# Patient Record
Sex: Female | Born: 1955
Health system: Southern US, Community
[De-identification: ages and names within clinical notes are randomized; demographics above are authoritative.]

## PROBLEM LIST (undated history)

## (undated) DIAGNOSIS — J189 Pneumonia, unspecified organism: Secondary | ICD-10-CM

## (undated) DIAGNOSIS — Z9289 Personal history of other medical treatment: Secondary | ICD-10-CM

## (undated) DIAGNOSIS — D649 Anemia, unspecified: Secondary | ICD-10-CM

## (undated) DIAGNOSIS — T8859XA Other complications of anesthesia, initial encounter: Secondary | ICD-10-CM

## (undated) DIAGNOSIS — G3184 Mild cognitive impairment, so stated: Secondary | ICD-10-CM

## (undated) DIAGNOSIS — F32A Depression, unspecified: Secondary | ICD-10-CM

## (undated) DIAGNOSIS — F419 Anxiety disorder, unspecified: Secondary | ICD-10-CM

## (undated) DIAGNOSIS — I4729 Other ventricular tachycardia: Secondary | ICD-10-CM

## (undated) DIAGNOSIS — M1990 Unspecified osteoarthritis, unspecified site: Secondary | ICD-10-CM

## (undated) DIAGNOSIS — N390 Urinary tract infection, site not specified: Secondary | ICD-10-CM

## (undated) DIAGNOSIS — R000 Tachycardia, unspecified: Secondary | ICD-10-CM

## (undated) DIAGNOSIS — Q210 Ventricular septal defect: Secondary | ICD-10-CM

## (undated) DIAGNOSIS — Z8719 Personal history of other diseases of the digestive system: Secondary | ICD-10-CM

## (undated) DIAGNOSIS — R011 Cardiac murmur, unspecified: Secondary | ICD-10-CM

## (undated) DIAGNOSIS — K219 Gastro-esophageal reflux disease without esophagitis: Secondary | ICD-10-CM

## (undated) DIAGNOSIS — E0590 Thyrotoxicosis, unspecified without thyrotoxic crisis or storm: Secondary | ICD-10-CM

## (undated) DIAGNOSIS — E119 Type 2 diabetes mellitus without complications: Secondary | ICD-10-CM

## (undated) DIAGNOSIS — I10 Essential (primary) hypertension: Secondary | ICD-10-CM

## (undated) DIAGNOSIS — E7800 Pure hypercholesterolemia, unspecified: Secondary | ICD-10-CM

## (undated) DIAGNOSIS — I472 Ventricular tachycardia, unspecified: Secondary | ICD-10-CM

## (undated) DIAGNOSIS — F319 Bipolar disorder, unspecified: Secondary | ICD-10-CM

## (undated) DIAGNOSIS — F329 Major depressive disorder, single episode, unspecified: Secondary | ICD-10-CM

## (undated) DIAGNOSIS — I509 Heart failure, unspecified: Secondary | ICD-10-CM

## (undated) DIAGNOSIS — I429 Cardiomyopathy, unspecified: Secondary | ICD-10-CM

## (undated) HISTORY — DX: Cardiomyopathy, unspecified: I42.9

## (undated) HISTORY — PX: BACK SURGERY: SHX140

## (undated) HISTORY — PX: SHOULDER ARTHROSCOPY: SHX128

## (undated) HISTORY — DX: Tachycardia, unspecified: R00.0

## (undated) HISTORY — DX: Other ventricular tachycardia: I47.29

## (undated) HISTORY — PX: HERNIA REPAIR: SHX51

## (undated) HISTORY — DX: Ventricular septal defect: Q21.0

## (undated) HISTORY — DX: Essential (primary) hypertension: I10

## (undated) HISTORY — PX: SHOULDER OPEN ROTATOR CUFF REPAIR: SHX2407

## (undated) HISTORY — DX: Mild cognitive impairment, so stated: G31.84

## (undated) HISTORY — DX: Ventricular tachycardia: I47.2

## (undated) HISTORY — DX: Heart failure, unspecified: I50.9

## (undated) HISTORY — PX: TOTAL ABDOMINAL HYSTERECTOMY: SHX209

## (undated) HISTORY — PX: SPINE HARDWARE REMOVAL: SUR1132

## (undated) HISTORY — PX: ABDOMINAL HYSTERECTOMY: SUR658

## (undated) HISTORY — PX: JOINT REPLACEMENT: SHX530

## (undated) HISTORY — PX: REFRACTIVE SURGERY: SHX103

## (undated) HISTORY — DX: Ventricular tachycardia, unspecified: I47.20

## (undated) HISTORY — PX: TOTAL KNEE ARTHROPLASTY: SHX125

## (undated) HISTORY — PX: FRACTURE SURGERY: SHX138

---
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
## 1994-07-19 HISTORY — PX: SPINAL FUSION: SHX223

## 1997-11-11 ENCOUNTER — Other Ambulatory Visit: Admission: RE | Admit: 1997-11-11 | Discharge: 1997-11-11 | Payer: Self-pay | Admitting: Orthopedic Surgery

## 1997-11-21 ENCOUNTER — Ambulatory Visit (HOSPITAL_BASED_OUTPATIENT_CLINIC_OR_DEPARTMENT_OTHER): Admission: RE | Admit: 1997-11-21 | Discharge: 1997-11-21 | Payer: Self-pay | Admitting: Orthopedic Surgery

## 1997-12-06 ENCOUNTER — Other Ambulatory Visit: Admission: RE | Admit: 1997-12-06 | Discharge: 1997-12-06 | Payer: Self-pay | Admitting: Orthopedic Surgery

## 1998-04-01 ENCOUNTER — Other Ambulatory Visit: Admission: RE | Admit: 1998-04-01 | Discharge: 1998-04-01 | Payer: Self-pay | Admitting: Obstetrics and Gynecology

## 1998-10-21 ENCOUNTER — Other Ambulatory Visit: Admission: RE | Admit: 1998-10-21 | Discharge: 1998-10-21 | Payer: Self-pay | Admitting: Obstetrics and Gynecology

## 1998-10-22 ENCOUNTER — Other Ambulatory Visit: Admission: RE | Admit: 1998-10-22 | Discharge: 1998-10-22 | Payer: Self-pay | Admitting: Obstetrics and Gynecology

## 1999-05-03 ENCOUNTER — Emergency Department (HOSPITAL_COMMUNITY): Admission: EM | Admit: 1999-05-03 | Discharge: 1999-05-03 | Payer: Self-pay | Admitting: *Deleted

## 1999-05-13 ENCOUNTER — Encounter: Admission: RE | Admit: 1999-05-13 | Discharge: 1999-05-13 | Payer: Self-pay | Admitting: Family Medicine

## 1999-05-15 ENCOUNTER — Other Ambulatory Visit: Admission: RE | Admit: 1999-05-15 | Discharge: 1999-05-15 | Payer: Self-pay | Admitting: Obstetrics and Gynecology

## 1999-06-13 ENCOUNTER — Inpatient Hospital Stay (HOSPITAL_COMMUNITY): Admission: EM | Admit: 1999-06-13 | Discharge: 1999-06-16 | Payer: Self-pay | Admitting: Emergency Medicine

## 1999-08-19 ENCOUNTER — Other Ambulatory Visit: Admission: RE | Admit: 1999-08-19 | Discharge: 1999-08-19 | Payer: Self-pay | Admitting: Obstetrics and Gynecology

## 1999-09-22 ENCOUNTER — Other Ambulatory Visit (HOSPITAL_COMMUNITY): Admission: RE | Admit: 1999-09-22 | Discharge: 1999-10-05 | Payer: Self-pay | Admitting: Psychiatry

## 1999-10-06 ENCOUNTER — Inpatient Hospital Stay (HOSPITAL_COMMUNITY): Admission: RE | Admit: 1999-10-06 | Discharge: 1999-10-08 | Payer: Self-pay | Admitting: Obstetrics and Gynecology

## 2000-10-01 ENCOUNTER — Emergency Department (HOSPITAL_COMMUNITY): Admission: EM | Admit: 2000-10-01 | Discharge: 2000-10-01 | Payer: Self-pay | Admitting: Emergency Medicine

## 2000-11-25 ENCOUNTER — Ambulatory Visit (HOSPITAL_COMMUNITY): Admission: RE | Admit: 2000-11-25 | Discharge: 2000-11-25 | Payer: Self-pay | Admitting: Gastroenterology

## 2001-01-13 ENCOUNTER — Ambulatory Visit (HOSPITAL_COMMUNITY): Admission: RE | Admit: 2001-01-13 | Discharge: 2001-01-13 | Payer: Self-pay | Admitting: Surgery

## 2001-01-30 ENCOUNTER — Ambulatory Visit (HOSPITAL_COMMUNITY): Admission: RE | Admit: 2001-01-30 | Discharge: 2001-01-30 | Payer: Self-pay | Admitting: Surgery

## 2001-02-09 ENCOUNTER — Emergency Department (HOSPITAL_COMMUNITY): Admission: EM | Admit: 2001-02-09 | Discharge: 2001-02-09 | Payer: Self-pay | Admitting: Emergency Medicine

## 2001-02-22 ENCOUNTER — Observation Stay (HOSPITAL_COMMUNITY): Admission: RE | Admit: 2001-02-22 | Discharge: 2001-02-24 | Payer: Self-pay | Admitting: Surgery

## 2001-03-15 ENCOUNTER — Inpatient Hospital Stay (HOSPITAL_COMMUNITY): Admission: EM | Admit: 2001-03-15 | Discharge: 2001-03-22 | Payer: Self-pay | Admitting: *Deleted

## 2001-04-19 ENCOUNTER — Emergency Department (HOSPITAL_COMMUNITY): Admission: EM | Admit: 2001-04-19 | Discharge: 2001-04-19 | Payer: Self-pay | Admitting: Emergency Medicine

## 2001-05-08 ENCOUNTER — Inpatient Hospital Stay (HOSPITAL_COMMUNITY): Admission: EM | Admit: 2001-05-08 | Discharge: 2001-05-10 | Payer: Self-pay | Admitting: Emergency Medicine

## 2001-12-11 ENCOUNTER — Ambulatory Visit (HOSPITAL_COMMUNITY): Admission: RE | Admit: 2001-12-11 | Discharge: 2001-12-12 | Payer: Self-pay | Admitting: Orthopedic Surgery

## 2001-12-12 ENCOUNTER — Inpatient Hospital Stay (HOSPITAL_COMMUNITY): Admission: EM | Admit: 2001-12-12 | Discharge: 2002-01-15 | Payer: Self-pay | Admitting: Emergency Medicine

## 2002-01-17 ENCOUNTER — Ambulatory Visit (HOSPITAL_COMMUNITY): Admission: RE | Admit: 2002-01-17 | Discharge: 2002-01-17 | Payer: Self-pay | Admitting: Orthopedic Surgery

## 2002-06-18 ENCOUNTER — Emergency Department (HOSPITAL_COMMUNITY): Admission: EM | Admit: 2002-06-18 | Discharge: 2002-06-18 | Payer: Self-pay | Admitting: *Deleted

## 2002-06-18 ENCOUNTER — Encounter: Payer: Self-pay | Admitting: *Deleted

## 2002-09-06 ENCOUNTER — Ambulatory Visit (HOSPITAL_COMMUNITY): Admission: RE | Admit: 2002-09-06 | Discharge: 2002-09-06 | Payer: Self-pay | Admitting: Cardiology

## 2002-09-06 ENCOUNTER — Encounter: Payer: Self-pay | Admitting: Cardiology

## 2002-09-07 ENCOUNTER — Ambulatory Visit (HOSPITAL_COMMUNITY): Admission: RE | Admit: 2002-09-07 | Discharge: 2002-09-07 | Payer: Self-pay | Admitting: Cardiology

## 2002-11-22 ENCOUNTER — Ambulatory Visit (HOSPITAL_COMMUNITY): Admission: RE | Admit: 2002-11-22 | Discharge: 2002-11-22 | Payer: Self-pay | Admitting: Cardiology

## 2003-02-06 ENCOUNTER — Encounter: Payer: Self-pay | Admitting: Family Medicine

## 2003-02-06 ENCOUNTER — Ambulatory Visit (HOSPITAL_COMMUNITY): Admission: RE | Admit: 2003-02-06 | Discharge: 2003-02-06 | Payer: Self-pay | Admitting: Family Medicine

## 2003-09-02 ENCOUNTER — Encounter: Admission: RE | Admit: 2003-09-02 | Discharge: 2003-09-02 | Payer: Self-pay | Admitting: Gastroenterology

## 2004-01-29 ENCOUNTER — Encounter: Admission: RE | Admit: 2004-01-29 | Discharge: 2004-01-29 | Payer: Self-pay | Admitting: Obstetrics and Gynecology

## 2004-02-25 ENCOUNTER — Encounter: Admission: RE | Admit: 2004-02-25 | Discharge: 2004-02-25 | Payer: Self-pay | Admitting: Obstetrics and Gynecology

## 2004-05-11 ENCOUNTER — Ambulatory Visit: Payer: Self-pay | Admitting: Pain Medicine

## 2005-03-08 ENCOUNTER — Encounter: Admission: RE | Admit: 2005-03-08 | Discharge: 2005-03-08 | Payer: Self-pay | Admitting: Obstetrics and Gynecology

## 2005-03-25 ENCOUNTER — Ambulatory Visit: Payer: Self-pay | Admitting: Cardiology

## 2005-03-29 ENCOUNTER — Ambulatory Visit: Payer: Self-pay | Admitting: Cardiology

## 2005-04-05 ENCOUNTER — Ambulatory Visit: Payer: Self-pay | Admitting: Cardiology

## 2005-04-15 ENCOUNTER — Encounter: Admission: RE | Admit: 2005-04-15 | Discharge: 2005-04-15 | Payer: Self-pay | Admitting: Obstetrics and Gynecology

## 2005-05-05 ENCOUNTER — Encounter: Admission: RE | Admit: 2005-05-05 | Discharge: 2005-05-05 | Payer: Self-pay | Admitting: Orthopedic Surgery

## 2005-11-17 ENCOUNTER — Encounter: Payer: Self-pay | Admitting: Orthopedic Surgery

## 2005-11-17 ENCOUNTER — Encounter: Payer: Self-pay | Admitting: Emergency Medicine

## 2005-11-17 ENCOUNTER — Encounter: Payer: Self-pay | Admitting: Surgery

## 2006-04-11 ENCOUNTER — Encounter: Admission: RE | Admit: 2006-04-11 | Discharge: 2006-04-11 | Payer: Self-pay | Admitting: Obstetrics and Gynecology

## 2007-04-07 ENCOUNTER — Ambulatory Visit: Payer: Self-pay | Admitting: Cardiology

## 2007-05-03 ENCOUNTER — Encounter: Admission: RE | Admit: 2007-05-03 | Discharge: 2007-05-03 | Payer: Self-pay | Admitting: Family Medicine

## 2007-07-27 ENCOUNTER — Ambulatory Visit (HOSPITAL_COMMUNITY): Admission: RE | Admit: 2007-07-27 | Discharge: 2007-07-27 | Payer: Self-pay | Admitting: Family Medicine

## 2007-09-22 IMAGING — CT CT L SPINE W/O CM
3 series · 16 of 33 positions shown, 19 images · IV contrast (agent unspecified)
Comparison: none

CLINICAL DATA: 49 year old with back pain, scoliosis, and lumbar fusion. 
CT LUMBAR SPINE W/O CONTRAST:
The patient has a severe scoliotic curvature of the thoracolumbar spine.  There is evidence of a previous lumbar fusion.  There is also evidence of remote hardware in the lower lumbar spine with a pedicle screw which has been removed.  There is extensive posterolateral bony fusion.  There is incomplete fusion at the L2-3 level with separation of the large mass of bone.  It appears there has also been interbody fusions although some of this could be autofusion.  There is a vacuum disc phenomenon noted at L2-3.  
L1-2:  No spinal or foraminal stenosis. 
L2-3:  No significant spinal lateral recess stenosis.  No definite foraminal stenosis. 
L3-4:  Interbody fusion along the posterior fusion.  No significant spinal or foraminal stenosis. Mild foraminal encroachment on the left due to a shallow disc protrusion.
L4-5:  No significant spinal or foraminal stenosis.  
L5-S1:  Interbody fusion.  There is mild anterolisthesis of L5 compared to S1.  The exiting L5 nerve roots appear normal.  No spinal stenosis involving the S1 nerve roots.

[Series 4: recon 3: l-spine helical · axial · 0.27mm/px · z∈[-68,+100]mm · 8 of 161 slices shown, 10 images]
[im 13/161  soft-tissue]
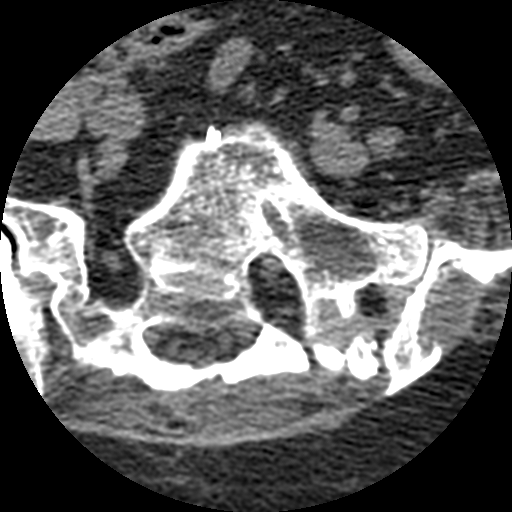
[im 13/161  bone]
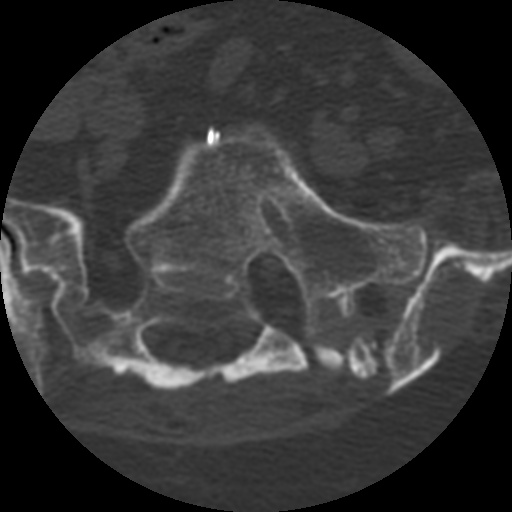
[im 37/161  bone]
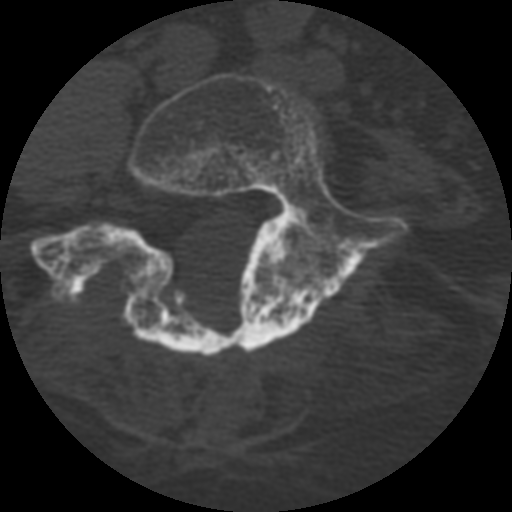
[im 50/161  bone]
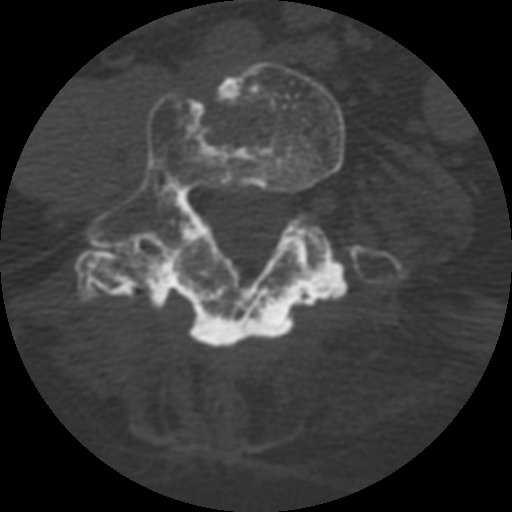
[im 74/161  bone]
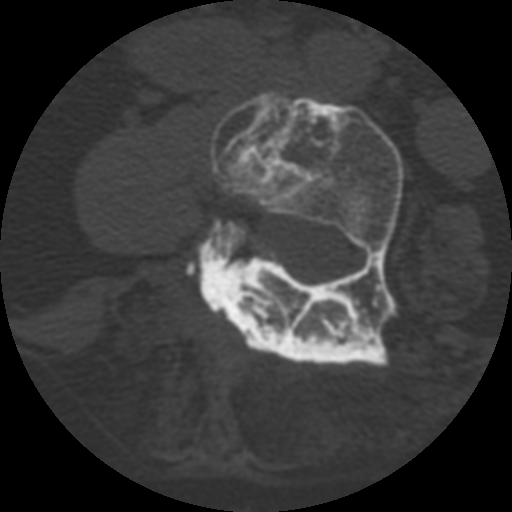
[im 87/161  soft-tissue]
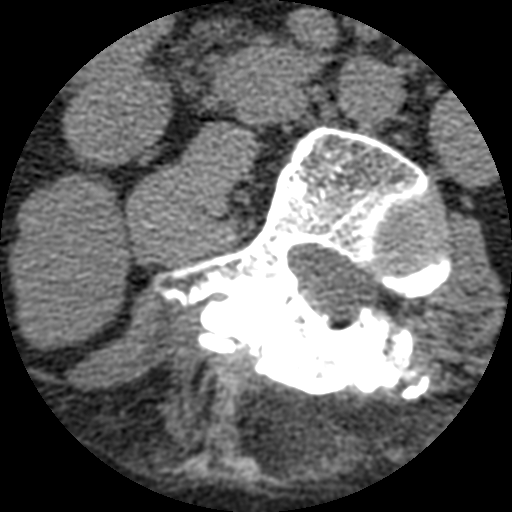
[im 87/161  bone]
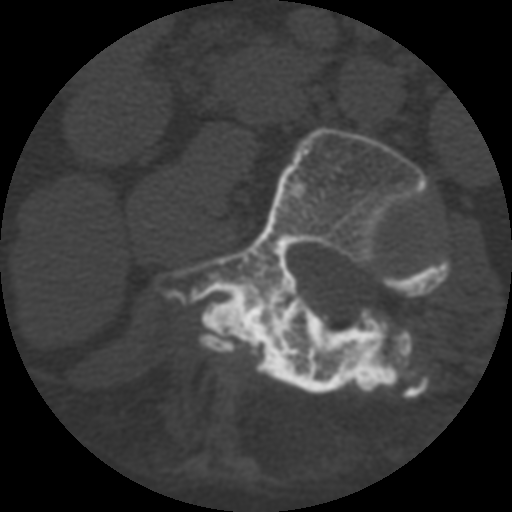
[im 111/161  bone]
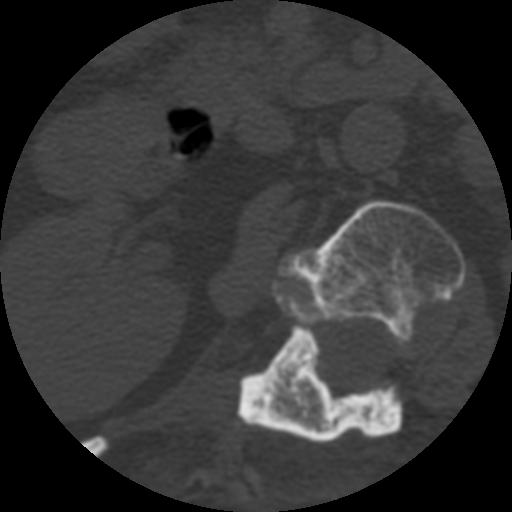
[im 124/161  bone]
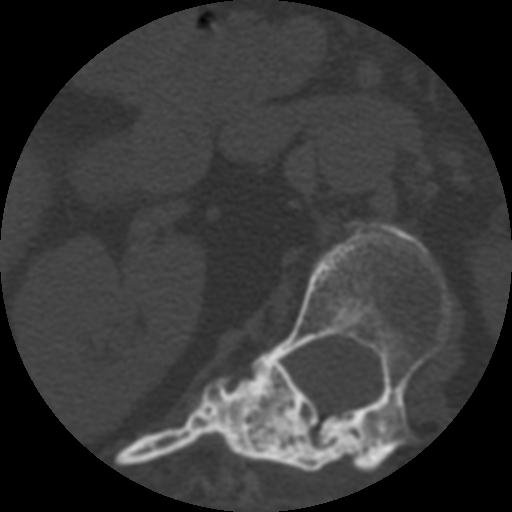
[im 148/161  bone]
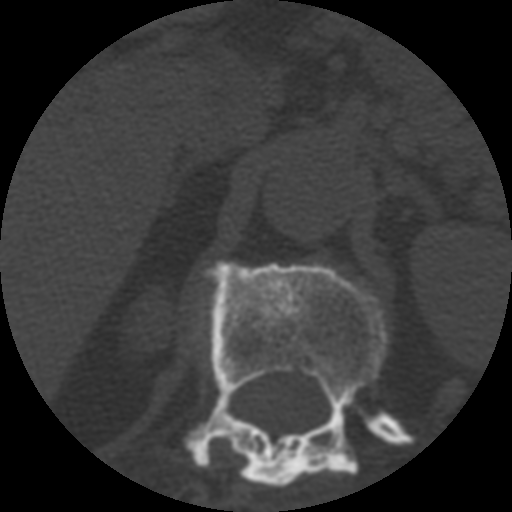

[Series 400: reformatted · sagittal · 0.39mm/px · 5 of 38 slices shown, 6 images (1 of 2)]
[im 13/38  bone]
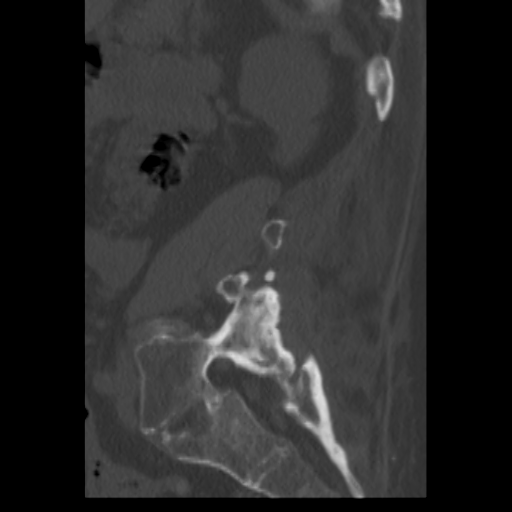
[im 16/38  bone]
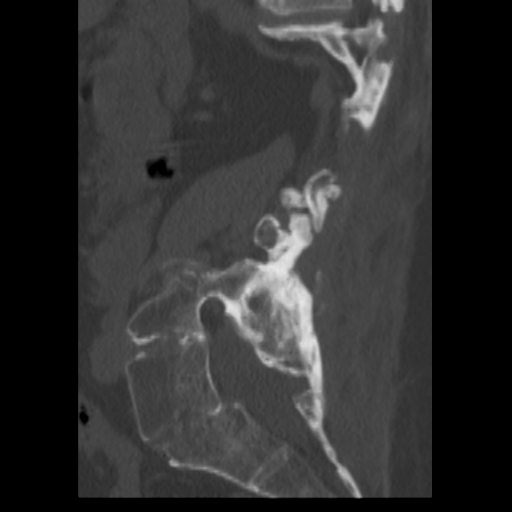
[im 19/38  soft-tissue]
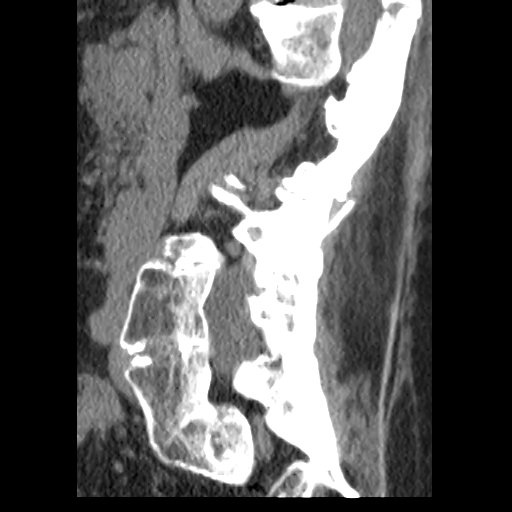
[im 19/38  bone]
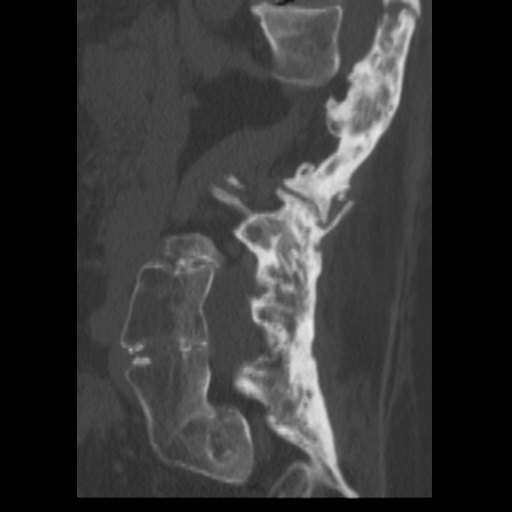
[im 22/38  bone]
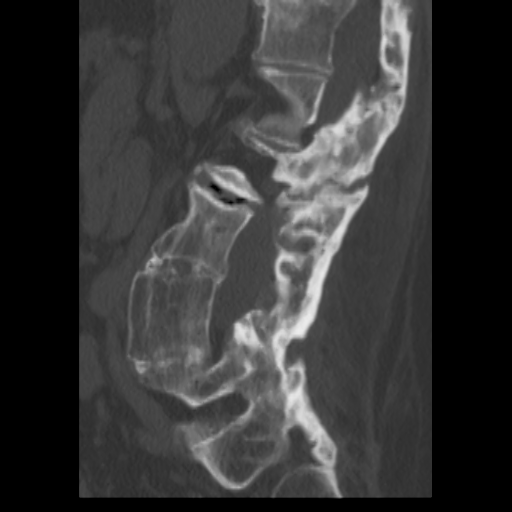
[im 25/38  bone]
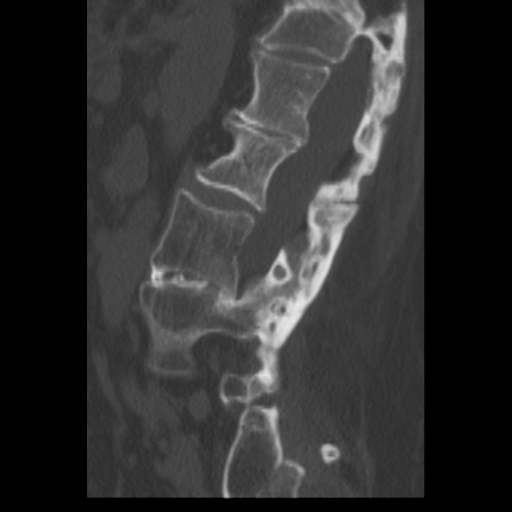

[Series 401: reformatted · coronal · 0.39mm/px · 3 of 40 slices shown (2 of 2)]
[im 8/40  bone]
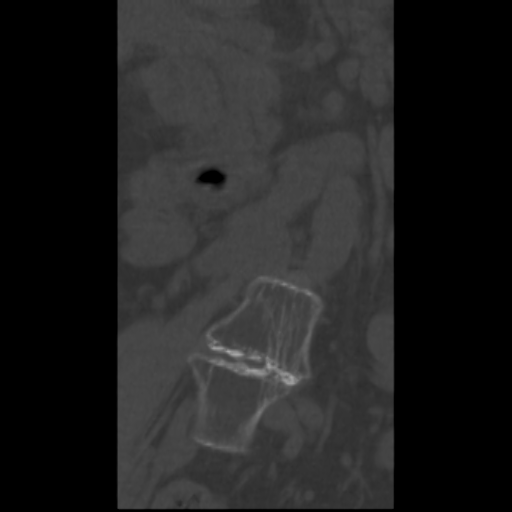
[im 16/40  bone]
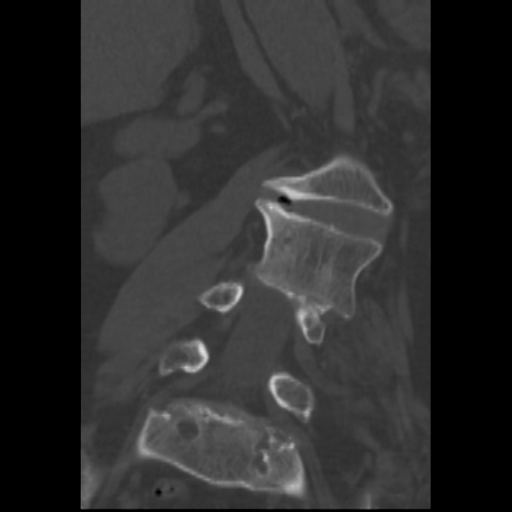
[im 24/40  bone]
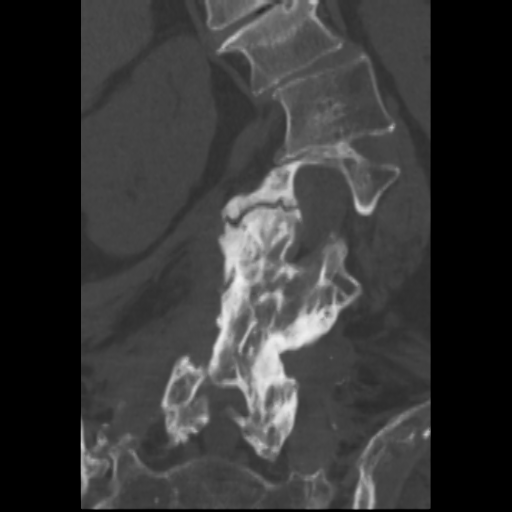

[16 of 33 positions shown; findings below may reference images not displayed]

IMPRESSION: 1.  Severe scoliotic curvature of the lumbar spine.  There is posterior and interbody fusion from T12 to S1.  There is incomplete fusion posteriorly at the L2 level.  
2.  The interbody fusions appear to be at L3-4, L4-5, and L5-S1.  No significant spinal, lateral recess, or foraminal stenosis.  There is mild left foraminal encroachment at L3-4 due to a shallow disc protrusion.  
3.  Mild anterolisthesis of L5 compared to S1.

## 2007-09-23 ENCOUNTER — Emergency Department (HOSPITAL_COMMUNITY): Admission: EM | Admit: 2007-09-23 | Discharge: 2007-09-24 | Payer: Self-pay | Admitting: Emergency Medicine

## 2008-02-19 ENCOUNTER — Ambulatory Visit (HOSPITAL_COMMUNITY): Admission: RE | Admit: 2008-02-19 | Discharge: 2008-02-19 | Payer: Self-pay | Admitting: Orthopedic Surgery

## 2008-02-21 ENCOUNTER — Ambulatory Visit: Payer: Self-pay

## 2008-02-21 ENCOUNTER — Ambulatory Visit: Payer: Self-pay | Admitting: Cardiology

## 2008-02-21 ENCOUNTER — Encounter (INDEPENDENT_AMBULATORY_CARE_PROVIDER_SITE_OTHER): Payer: Self-pay | Admitting: Anesthesiology

## 2008-04-02 ENCOUNTER — Ambulatory Visit (HOSPITAL_BASED_OUTPATIENT_CLINIC_OR_DEPARTMENT_OTHER): Admission: RE | Admit: 2008-04-02 | Discharge: 2008-04-03 | Payer: Self-pay | Admitting: Orthopedic Surgery

## 2008-06-19 ENCOUNTER — Encounter: Admission: RE | Admit: 2008-06-19 | Discharge: 2008-06-19 | Payer: Self-pay | Admitting: Family Medicine

## 2009-06-13 DIAGNOSIS — I472 Ventricular tachycardia, unspecified: Secondary | ICD-10-CM | POA: Insufficient documentation

## 2009-06-13 DIAGNOSIS — I10 Essential (primary) hypertension: Secondary | ICD-10-CM | POA: Insufficient documentation

## 2009-06-13 DIAGNOSIS — E119 Type 2 diabetes mellitus without complications: Secondary | ICD-10-CM | POA: Insufficient documentation

## 2009-06-13 DIAGNOSIS — I4729 Other ventricular tachycardia: Secondary | ICD-10-CM | POA: Insufficient documentation

## 2009-06-13 DIAGNOSIS — Q210 Ventricular septal defect: Secondary | ICD-10-CM | POA: Insufficient documentation

## 2009-06-17 ENCOUNTER — Ambulatory Visit: Payer: Self-pay | Admitting: Cardiology

## 2009-06-26 ENCOUNTER — Encounter (INDEPENDENT_AMBULATORY_CARE_PROVIDER_SITE_OTHER): Payer: Self-pay

## 2009-07-30 ENCOUNTER — Encounter: Admission: RE | Admit: 2009-07-30 | Discharge: 2009-07-30 | Payer: Self-pay | Admitting: Obstetrics and Gynecology

## 2009-08-04 ENCOUNTER — Ambulatory Visit: Payer: Self-pay

## 2009-08-04 ENCOUNTER — Encounter: Payer: Self-pay | Admitting: Cardiology

## 2009-08-04 ENCOUNTER — Ambulatory Visit: Payer: Self-pay | Admitting: Cardiology

## 2009-08-04 ENCOUNTER — Ambulatory Visit (HOSPITAL_COMMUNITY): Admission: RE | Admit: 2009-08-04 | Discharge: 2009-08-04 | Payer: Self-pay | Admitting: Cardiology

## 2010-02-13 ENCOUNTER — Encounter: Payer: Self-pay | Admitting: Cardiology

## 2010-02-13 ENCOUNTER — Ambulatory Visit: Payer: Self-pay

## 2010-02-13 ENCOUNTER — Ambulatory Visit: Payer: Self-pay | Admitting: Cardiovascular Disease

## 2010-02-13 ENCOUNTER — Ambulatory Visit (HOSPITAL_COMMUNITY): Admission: RE | Admit: 2010-02-13 | Discharge: 2010-02-13 | Payer: Self-pay | Admitting: Cardiology

## 2010-02-16 ENCOUNTER — Telehealth: Payer: Self-pay | Admitting: Cardiology

## 2010-03-10 ENCOUNTER — Telehealth: Payer: Self-pay | Admitting: Cardiology

## 2010-03-18 ENCOUNTER — Ambulatory Visit: Payer: Self-pay | Admitting: Cardiology

## 2010-03-18 DIAGNOSIS — I429 Cardiomyopathy, unspecified: Secondary | ICD-10-CM | POA: Insufficient documentation

## 2010-03-18 DIAGNOSIS — Z720 Tobacco use: Secondary | ICD-10-CM | POA: Insufficient documentation

## 2010-03-18 DIAGNOSIS — F17200 Nicotine dependence, unspecified, uncomplicated: Secondary | ICD-10-CM | POA: Insufficient documentation

## 2010-03-24 ENCOUNTER — Encounter (INDEPENDENT_AMBULATORY_CARE_PROVIDER_SITE_OTHER): Payer: Self-pay | Admitting: *Deleted

## 2010-03-27 ENCOUNTER — Ambulatory Visit (HOSPITAL_COMMUNITY): Admission: RE | Admit: 2010-03-27 | Discharge: 2010-03-27 | Payer: Self-pay | Admitting: Cardiology

## 2010-03-27 ENCOUNTER — Ambulatory Visit: Payer: Self-pay | Admitting: Cardiology

## 2010-03-30 ENCOUNTER — Ambulatory Visit: Payer: Self-pay | Admitting: Cardiology

## 2010-04-02 LAB — CONVERTED CEMR LAB
CO2: 29 meq/L (ref 19–32)
Chloride: 105 meq/L (ref 96–112)
Potassium: 5 meq/L (ref 3.5–5.1)
Sodium: 141 meq/L (ref 135–145)

## 2010-04-15 ENCOUNTER — Ambulatory Visit: Payer: Self-pay | Admitting: Cardiology

## 2010-04-20 LAB — CONVERTED CEMR LAB
BUN: 23 mg/dL (ref 6–23)
CO2: 22 meq/L (ref 19–32)
Chloride: 106 meq/L (ref 96–112)
Glucose, Bld: 101 mg/dL — ABNORMAL HIGH (ref 70–99)
Potassium: 4.3 meq/L (ref 3.5–5.1)
Sodium: 138 meq/L (ref 135–145)

## 2010-05-20 ENCOUNTER — Encounter: Payer: Self-pay | Admitting: Cardiology

## 2010-05-20 ENCOUNTER — Ambulatory Visit: Payer: Self-pay | Admitting: Cardiology

## 2010-06-01 ENCOUNTER — Telehealth: Payer: Self-pay | Admitting: Cardiology

## 2010-06-04 ENCOUNTER — Ambulatory Visit: Payer: Self-pay | Admitting: Cardiology

## 2010-06-24 DIAGNOSIS — Q219 Congenital malformation of cardiac septum, unspecified: Secondary | ICD-10-CM | POA: Insufficient documentation

## 2010-06-24 DIAGNOSIS — I509 Heart failure, unspecified: Secondary | ICD-10-CM | POA: Insufficient documentation

## 2010-06-25 LAB — CONVERTED CEMR LAB
CO2: 25 meq/L (ref 19–32)
Calcium: 9 mg/dL (ref 8.4–10.5)
Chloride: 100 meq/L (ref 96–112)
Creatinine, Ser: 0.9 mg/dL (ref 0.4–1.2)
Glucose, Bld: 87 mg/dL (ref 70–99)

## 2010-07-03 ENCOUNTER — Ambulatory Visit: Payer: Self-pay

## 2010-07-03 ENCOUNTER — Encounter: Payer: Self-pay | Admitting: Cardiology

## 2010-07-03 ENCOUNTER — Ambulatory Visit: Payer: Self-pay | Admitting: Cardiology

## 2010-07-03 ENCOUNTER — Ambulatory Visit (HOSPITAL_COMMUNITY)
Admission: RE | Admit: 2010-07-03 | Discharge: 2010-07-03 | Payer: Self-pay | Source: Home / Self Care | Attending: Cardiology | Admitting: Cardiology

## 2010-07-06 LAB — CONVERTED CEMR LAB
BUN: 16 mg/dL (ref 6–23)
Creatinine, Ser: 0.79 mg/dL (ref 0.40–1.20)
Glucose, Bld: 85 mg/dL (ref 70–99)
Potassium: 4.1 meq/L (ref 3.5–5.3)

## 2010-07-17 ENCOUNTER — Ambulatory Visit: Payer: Self-pay | Admitting: Cardiology

## 2010-07-22 ENCOUNTER — Telehealth: Payer: Self-pay | Admitting: Cardiology

## 2010-07-31 ENCOUNTER — Encounter: Payer: Self-pay | Admitting: Cardiology

## 2010-08-05 ENCOUNTER — Encounter: Payer: Self-pay | Admitting: Cardiology

## 2010-08-05 ENCOUNTER — Ambulatory Visit
Admission: RE | Admit: 2010-08-05 | Discharge: 2010-08-05 | Payer: Self-pay | Source: Home / Self Care | Attending: Cardiology | Admitting: Cardiology

## 2010-08-09 ENCOUNTER — Encounter: Payer: Self-pay | Admitting: Obstetrics and Gynecology

## 2010-08-10 ENCOUNTER — Ambulatory Visit
Admission: RE | Admit: 2010-08-10 | Discharge: 2010-08-10 | Payer: Self-pay | Source: Home / Self Care | Attending: Cardiology | Admitting: Cardiology

## 2010-08-10 ENCOUNTER — Encounter: Payer: Self-pay | Admitting: Cardiology

## 2010-08-12 ENCOUNTER — Other Ambulatory Visit: Payer: Self-pay | Admitting: Obstetrics and Gynecology

## 2010-08-12 ENCOUNTER — Telehealth: Payer: Self-pay | Admitting: Cardiology

## 2010-08-12 DIAGNOSIS — Z1239 Encounter for other screening for malignant neoplasm of breast: Secondary | ICD-10-CM

## 2010-08-12 LAB — CONVERTED CEMR LAB
BUN: 24 mg/dL — ABNORMAL HIGH (ref 6–23)
Calcium: 9.4 mg/dL (ref 8.4–10.5)
Glucose, Bld: 77 mg/dL (ref 70–99)
Potassium: 4.5 meq/L (ref 3.5–5.3)
Sodium: 137 meq/L (ref 135–145)

## 2010-08-16 LAB — CONVERTED CEMR LAB
BUN: 19 mg/dL (ref 6–23)
Chloride: 104 meq/L (ref 96–112)
GFR calc non Af Amer: 80.23 mL/min (ref >60)
Magnesium: 2 mg/dL (ref 1.5–2.5)
Potassium: 4.2 meq/L (ref 3.5–5.1)
Sodium: 138 meq/L (ref 135–145)

## 2010-08-18 NOTE — Assessment & Plan Note (Signed)
Summary: f/u echo   Visit Type:  4m follow up Primary Provider:  Dr.Robert Nicholos Johns  CC:  dizziness and tired.  History of Present Illness: [name] is in for follow up to review our echo findings, and to discuss possible etiologies and treatment plans.  She acknowledges seeing the letter we sent her and also says she is not taking amlodiipine either.  She continues to smoke, but fortunately has not been drinking in quite a few years now.  The patient has history of congenital VSD, and cryoablation for VT.  Prior coronary angio was normal   Current Medications (verified): 1)  Luvox Cr 100 Mg Xr24h-Cap (Fluvoxamine Maleate) .... Take 1 Capsule By Mouth Three Times A Day 2)  Seroquel 300 Mg Tabs (Quetiapine Fumarate) .... Take 2 Tablets Daily 3)  Trazodone Hcl 100 Mg Tabs (Trazodone Hcl) .... Take 1 Tab By Mouth At Bedtime 4)  Synthroid 25 Mcg Tabs (Levothyroxine Sodium) .... Once Daily 5)  Wellbutrin Xl 300 Mg Xr24h-Tab (Bupropion Hcl) .... Take 1 Tablet By Mouth Once A Day 6)  Multivitamins  Tabs (Multiple Vitamin) 7)  Aspirin 81 Mg Tbec (Aspirin) .... Take One Tablet By Mouth Daily 8)  Omeprazole 20 Mg Tbec (Omeprazole) .... Take 1 Tablet By Mouth Once A Day 9)  Metformin Hcl 1000 Mg Tabs (Metformin Hcl) .... Take 1 Tablet By Mouth Two Times A Day 10)  Gabapentin 300 Mg Caps (Gabapentin) .... Take 2 Caps Two Times A Day  Allergies (verified): No Known Drug Allergies  Past History:  Past Medical History: Last updated: 2009-07-03 Problems:  HYPERTENSION, UNSPECIFIED (ICD-401.9) VENTRICULAR SEPTAL DEFECT, CONGENITAL (ICD-745.4) VENTRICULAR TACHYCARDIA (ICD-427.1) DM (ICD-250.00)  Past Surgical History: Last updated: July 03, 2009  Diagnostic arthroscopy of right shoulder with arthroscopic  debridement of labral tear Hernia repair  Family History: Last updated: 07/03/09  Her father died at age 14 of CVA, her mother is alive.   Multiple uncles have died with myocardial infarction.      Social History: Last updated: Jul 03, 2009  The patient is married.  She is a former Runner, broadcasting/film/video.  She   smokes.    Vital Signs:  Patient profile:   55 year old female Height:      64 inches Weight:      185.50 pounds BMI:     31.96 Pulse rate:   86 / minute BP sitting:   132 / 96  (left arm) Cuff size:   large  Vitals Entered By: Caralee Ates CMA (March 18, 2010 2:24 PM)  Physical Exam  General:  Well developed, well nourished, in no acute distress. Head:  normocephalic and atraumatic Eyes:  PERRLA/EOM intact; conjunctiva and lids normal. Lungs:  Clear bilaterally to auscultation and percussion. Heart:  Prominent VSD murmur, holosystolic at upper left sternal edge.   Extremities:  No clubbing or cyanosis. Neurologic:  Alert and oriented x 3.   EKG  Procedure date:  03/18/2010  Findings:      NSR.  LAE.  LAFB.  LVH.  No T wave inversion as seen previously.  Delay in R wave progression.  Echocardiogram  Procedure date:  02/13/2010  Findings:      Study Conclusions            - Left ventricle: The cavity size was moderately dilated. Wall       thickness was normal. Systolic function was moderately to severely       reduced. The estimated ejection fraction was in the range of 30%  to 35%. Diffuse hypokinesis.     - Aortic valve: Trivial regurgitation.     - Left atrium: The atrium was mildly dilated.     - Atrial septum: No defect or patent foramen ovale was identified.     - Impressions: High septal to perimembranous VSD     Impressions:            - High septal to perimembranous VSD  Impression & Recommendations:  Problem # 1:  HYPERTENSION, UNSPECIFIED (ICD-401.9) Has hypertension that is not well controlled at present.  With LV dysfunction, which may be due to poorly treated hypertension, will add ACE and check BMET before starting. Possible side effects reviewed in detail with patient.  The following medications were removed from the medication list:     Amlodipine Besylate 5 Mg Tabs (Amlodipine besylate) .[name]... Take one tablet by mouth daily Her updated medication list for this problem includes:    Aspirin 81 Mg Tbec (Aspirin) .[name]... Take one tablet by mouth daily    Lisinopril 10 Mg Tabs (Lisinopril) .[name]... Take one tablet by mouth daily  Problem # 2:  CARDIOMYOPATHY, SECONDARY (ICD-425.9) Likely due to hypertension, with global picture.  Other poss include less likely from cryoablation.  The following medications were removed from the medication list:    Amlodipine Besylate 5 Mg Tabs (Amlodipine besylate) .[name]... Take one tablet by mouth daily Her updated medication list for this problem includes:    Aspirin 81 Mg Tbec (Aspirin) .[name]... Take one tablet by mouth daily    Lisinopril 10 Mg Tabs (Lisinopril) .[name]... Take one tablet by mouth daily  Problem # 3:  VENTRICULAR SEPTAL DEFECT, CONGENITAL (ICD-745.4) Previously noted.  Exam consistent.  Orders: Cardiac MRI (Cardiac MRI) TLB-BMP (Basic Metabolic Panel-BMET) (80048-METABOL) TLB-Magnesium (Mg) (83735-MG)  Problem # 4:  DM (ICD-250.00) reviewed guidelines.  Symptoms could be from this.  Her updated medication list for this problem includes:    Aspirin 81 Mg Tbec (Aspirin) .[name]... Take one tablet by mouth daily    Metformin Hcl 1000 Mg Tabs (Metformin hcl) .[name]... Take 1 tablet by mouth two times a day    Lisinopril 10 Mg Tabs (Lisinopril) .[name]... Take one tablet by mouth daily  Problem # 5:  TOBACCO ABUSE (ICD-305.1) reviewed with patient.   Patient Instructions: 1)  Your physician recommends that you schedule a follow-up appointment in: 3 weeks 2)  Your physician recommends that you return for lab work today for a BMP & Magnesium level. Return in 1 week for a BMP/401.9 3)  Your physician has requested that you have a cardiac MRI.  Cardiac MRI uses a computer to create images of your heart as it's beating, producing both still and moving pictures of your heart and major blood vessels. For  further information please visit  [email or web address]  Please follow the instruction sheet given to you today for more information. 4)  Your physician has recommended you make the following change in your medication: ADD LISINOPRIL 10MG  by mouth DAILY.  Prescriptions: LISINOPRIL 10 MG TABS (LISINOPRIL) Take one tablet by mouth daily  #30 x 6   Entered by:   Whitney Maeola Sarah RN   Authorized by:   Ronaldo Miyamoto, MD, [facility]   Signed by:   Ellender Hose RN on 03/18/2010   Method used:   Electronically to        [facility]* (retail)       [address]  [place], [place]  [address]       Ph: [phone] or [phone]       Fax: [phone]   RxID:   [id number]

## 2010-08-18 NOTE — Assessment & Plan Note (Signed)
Summary: [name]   Visit Type:  Follow-up Primary Provider:  Dr.Robert Reade  CC:  No cardiac complaints.  History of Present Illness: Still having a dry cough, and bothering her at night.  She would like to change to another agent.  She does not like beta blockers, and they make her feel poorly.  Results of MRI reviewed in detail with her, including lack of scar, or suggestion of infiltrative disease.  Role and necessity of hypertension control emphasized with her.  Prior studies reviewed.    Current Medications (verified): 1)  Luvox Cr 100 Mg Xr24h-Cap (Fluvoxamine Maleate) .... Take 3 Capsules By Mouth At Bedtime 2)  Seroquel 300 Mg Tabs (Quetiapine Fumarate) .... Take 2 Tablets Daily 3)  Trazodone Hcl 100 Mg Tabs (Trazodone Hcl) .... Take 1 Tab By Mouth At Bedtime 4)  Synthroid 25 Mcg Tabs (Levothyroxine Sodium) .... Once Daily 5)  Wellbutrin Xl 300 Mg Xr24h-Tab (Bupropion Hcl) .... Take 1 Tablet By Mouth Once A Day 6)  Multivitamins  Tabs (Multiple Vitamin) 7)  Aspirin 81 Mg Tbec (Aspirin) .... Take One Tablet By Mouth Daily 8)  Omeprazole 20 Mg Tbec (Omeprazole) .... Take 1 Tablet By Mouth Once A Day 9)  Metformin Hcl 1000 Mg Tabs (Metformin Hcl) .... Take 1 Tablet By Mouth Once Daily 10)  Gabapentin 300 Mg Caps (Gabapentin) .... Take 2 Caps At Bedtime 11)  Lisinopril 10 Mg Tabs (Lisinopril) .... Take One Tablet By Mouth Daily  Allergies (verified): No Known Drug Allergies  Vital Signs:  Patient profile:   55 year old female Height:      64 inches Weight:      193.50 pounds BMI:     33.33 Pulse rate:   90 / minute Pulse rhythm:   regular Resp:     18 per minute BP sitting:   141 / 91  (left arm) Cuff size:   large  Vitals Entered By: Vikki Ports (May 20, 2010 2:18 PM)  Physical Exam  General:  Well developed, well nourished, in no acute distress. Head:  normocephalic and atraumatic Eyes:  PERRLA/EOM intact; conjunctiva and lids normal. Lungs:  Clear bilaterally  to auscultation and percussion. Heart:  Loud murmur in left parasternal area consistent with patients known VSD.  No other murmurs.  Pulses:  pulses normal in all 4 extremities Extremities:  No clubbing or cyanosis. Neurologic:  Alert and oriented x 3.   EKG  Procedure date:  05/20/2010  Findings:      NSR.  Left axis deviation.  Nonspecific T abnormality.  Impression & Recommendations:  Problem # 1:  CARDIOMYOPATHY, SECONDARY (ICD-425.9) Cannot tolerate ACE very welll and does not want to take beta blockers.  Will start with ARB in place of ACE inhibitors, and have her back early for a nurse check.  Recheck BMET promptly.  Would like to add low dose carvedilol, and we discussed a possible try at this after that visit.  Repeat echo is warranted in six weeks.  If not improved, we obtain other studies for cardiomyopathy.   Her updated medication list for this problem includes:    Aspirin 81 Mg Tbec (Aspirin) .[name]... Take one tablet by mouth daily    Atacand 16 Mg Tabs (Candesartan cilexetil) .[name]... Take one tablet by mouth once daily  Orders: EKG w/ Interpretation (93000) Echocardiogram (Echo)  Problem # 2:  HYPERTENSION, UNSPECIFIED (ICD-401.9) See above.  Need slightly better control.  Role in CM uncertain, but seems liike most likely etiology.  No ETOH in many years.   Her updated medication list for this problem includes:    Aspirin 81 Mg Tbec (Aspirin) .[name]... Take one tablet by mouth daily    Atacand 16 Mg Tabs (Candesartan cilexetil) .[name]... Take one tablet by mouth once daily  Orders: EKG w/ Interpretation (93000) Echocardiogram (Echo)  Problem # 3:  VENTRICULAR SEPTAL DEFECT, CONGENITAL (ICD-745.4) MR is concordant with prior cath data regarding left to right shunt.  Orders: EKG w/ Interpretation (93000) Echocardiogram (Echo)  Problem # 4:  VENTRICULAR TACHYCARDIA (ICD-427.1) No recurrence that we are aware of since ablation in the 90s.  Her updated medication list for  this problem includes:    Aspirin 81 Mg Tbec (Aspirin) .[name]... Take one tablet by mouth daily  Orders: EKG w/ Interpretation (93000) Echocardiogram (Echo)  Patient Instructions: 1)  Your physician recommends that you schedule a follow-up appointment in: 6 WEEKS with Dr Riley Kill 2)  Your physician recommends that you return for lab work and Nurse Visit in 2 WEEKS:  BMP (401.9, 745.4, 425.9) and BP check--if BP is okay Dr Riley Kill would like the pt to start Carvedilol 6.25mg  one-half tablet two times a day  3)  Your physician has recommended you make the following change in your medication: STOP Lisinopril, START Atacand 16mg  once a day 4)  Your physician has requested that you have an echocardiogram in 6 WEEKS.  Echocardiography is a painless test that uses sound waves to create images of your heart. It provides your doctor with information about the size and shape of your heart and how well your heart's chambers and valves are working.  This procedure takes approximately one hour. There are no restrictions for this procedure. Prescriptions: ATACAND 16 MG TABS (CANDESARTAN CILEXETIL) take one tablet by mouth once daily  #30 x 3   Entered by:   Julieta Gutting, RN, BSN   Authorized by:   Ronaldo Miyamoto, MD, [facility]   Signed by:   Julieta Gutting, RN, BSN on 05/20/2010   Method used:   Electronically to        [facility]* (retail)       [address]       [place], [place]  [address]       Ph: [phone]       Fax: [phone]   RxID:   [id number]

## 2010-08-18 NOTE — Progress Notes (Signed)
Summary: pt b/p is elevated  Phone Note Call from Patient Call back at [phone]   Caller: Patient Reason for Call: Talk to Nurse, Talk to Doctor Summary of Call: pt b/p readings are high and she wants to talk to you about them when I asked what they where she said she would like to tell you when you rtn the call Initial call taken by: Omer Jack,  March 10, 2010 11:00 AM  Follow-up for Phone Call        I spoke with the pt and she just bought a BP cuff yesterday.  The pt just checked her BP and it was 132/98.  The pt said she checked her BP about 2 hours ago and it was 148/128.  The pt got on the Internet and started looking things up about BP and Stroke.  The pt started having a HA and her face started tingling.  I told the pt that the earlier BP most likely was not accurate and the pt could be anxious from what she had seen on the Internet.  The pt will continue to monitor her BP and call back if she has any other questions or concerns.  Pt agrees with plan.    Follow-up by: Julieta Gutting, RN, BSN,  March 10, 2010 1:06 PM     Appended Document: pt b/p is elevated I tried to call the patient on Sunday.  No answer.  Message left.  Suggested that if the BP is up, that she consider increasing amlodipine to 10mg  daily.  I will call her back.  TS

## 2010-08-18 NOTE — Assessment & Plan Note (Signed)
Summary: 3wk f/u    Visit Type:  Follow-up Primary Ivaan Liddy:  Dr.Robert Reade  CC:  new cough.  History of Present Illness: [name] is in for followup.  We have a lengthy discussion regarding her findings on echo and MRI.  We discussed various mechanisms, including the possibility of diabetic changes, hypertension poorly controlled, and also the prior ablation.  We also discussed the need for medical therapies, BP control, followup, and ongoing evaluation in light of changes in her LV function.  She is monitoring her BP at home.  She does note some mild cough associated now with the use of her ACE inhibitor.  Otherwise, she is not having problems with it.   Current Medications (verified): 1)  Luvox Cr 100 Mg Xr24h-Cap (Fluvoxamine Maleate) .... Take 3 Capsules By Mouth At Bedtime 2)  Seroquel 300 Mg Tabs (Quetiapine Fumarate) .... Take 2 Tablets Daily 3)  Trazodone Hcl 100 Mg Tabs (Trazodone Hcl) .... Take 1 Tab By Mouth At Bedtime 4)  Synthroid 25 Mcg Tabs (Levothyroxine Sodium) .... Once Daily 5)  Wellbutrin Xl 300 Mg Xr24h-Tab (Bupropion Hcl) .... Take 1 Tablet By Mouth Once A Day 6)  Multivitamins  Tabs (Multiple Vitamin) 7)  Aspirin 81 Mg Tbec (Aspirin) .... Take One Tablet By Mouth Daily 8)  Omeprazole 20 Mg Tbec (Omeprazole) .... Take 1 Tablet By Mouth Once A Day 9)  Metformin Hcl 1000 Mg Tabs (Metformin Hcl) .... Take 1 Tablet By Mouth Once Daily 10)  Gabapentin 300 Mg Caps (Gabapentin) .... Take 2 Caps At Bedtime 11)  Lisinopril 10 Mg Tabs (Lisinopril) .... Take One Tablet By Mouth Daily  Allergies (verified): No Known Drug Allergies  Vital Signs:  Patient profile:   55 year old female Height:      64 inches Weight:      190 pounds BMI:     32.73 Pulse rate:   85 / minute BP sitting:   127 / 80  (left arm) Cuff size:   regular  Vitals Entered By: [name], RMA (April 15, 2010 2:04 PM)  Physical Exam  General:  Well developed, well nourished, in no acute  distress. Head:  normocephalic and atraumatic Eyes:  PERRLA/EOM intact; conjunctiva and lids normal. Lungs:  Clear bilaterally to auscultation and percussion. Heart:  Prominent VSD murmur as previously noted.  Normal S1 and S2 otherwise.   Extremities:  No clubbing or cyanosis. Neurologic:  Alert and oriented x 3.   MRI EXAM  Procedure date:  03/27/2010  Findings:      Findings: The left ventricle was mildly dilated with moderate to severe global hypokinesis, EF 31%.  The left atrium was mildly dilated.  The right ventricle was normal in size and systolic function.  The right atrium was normal in size.  The aortic valve was trileaflet with mild aortic insufficiency and no significant stenosis.  There was mild mitral regurgitation.  There was a small perimembranous VSD with left to right flow.  The pulmonary flow sequence was inaccurate, so an estimation of Qp/Qs can be obtained using the total LV stroke volume compared to the aortic forward flow (total LV stroke volume should encompass forward aortic flow, flow back across the VSD, and flow back across the mitral valve). Stroke volume/Qs = 1.2/1, so Qp/Qs would be expected to be < 1.2/1.   On delayed enhancement imaging, there was no myocardial delayed enhancement.   Measurements:   LV EDD 5.8 cm   LV EDV 253 mL   LV  SV 78 mL   Aortic forward volume 63 mL   IMPRESSION: 1. Mild LV dilation with global hypokinesis, EF 31%.   2. Small perimembranous VSD with Qp/Qs probably < 1.2/1.   3. No myocardial delayed enhancement, so no definite evidence for prior MI or infiltrative disease.   Read By:  Marca Ancona      Impression & Recommendations:  Problem # 1:  CARDIOMYOPATHY, SECONDARY (ICD-425.9) Mechanism of this remains unclear.  Will get three month followup after vigorous treatment of findings with ACE inhibition. Monitor BP and treat accordingly.  May add carvedilol to regimen.  See results of MRI.  No known CAD  from prior cath, shunt not noted to be large.  No large scar.  Her updated medication list for this problem includes:    Aspirin 81 Mg Tbec (Aspirin) .[name]... Take one tablet by mouth daily    Lisinopril 10 Mg Tabs (Lisinopril) .[name]... Take one tablet by mouth daily  Orders: TLB-BMP (Basic Metabolic Panel-BMET) (80048-METABOL)  Problem # 2:  HYPERTENSION, UNSPECIFIED (ICD-401.9) Better control.  Monitoring at home.   Her updated medication list for this problem includes:    Aspirin 81 Mg Tbec (Aspirin) .[name]... Take one tablet by mouth daily    Lisinopril 10 Mg Tabs (Lisinopril) .[name]... Take one tablet by mouth daily  Patient Instructions: 1)  Your physician recommends that you have lab work today: BMP 2)  Your physician recommends that you continue on your current medications as directed. Please refer to the Current Medication list given to you today. 3)  Your physician recommends that you schedule a follow-up appointment in: 1 MONTH

## 2010-08-18 NOTE — Progress Notes (Signed)
Summary: Echo results  Phone Note Outgoing Call   Call placed by: Julieta Gutting, RN, BSN,  February 16, 2010 5:44 PM Call placed to: Patient Summary of Call: I called the pt and left a message that Dr Riley Kill would like to see her on Wednesday about the results of her Echo.  The pt's EF has decreased from 45-50% to 30-35% in the last 6 months.  The pt can be added to Dr Rosalyn Charters schedule at 2:15 on 02/18/10.  Initial call taken by: Julieta Gutting, RN, BSN,  February 16, 2010 5:45 PM  Follow-up for Phone Call        I left a message at the pt's home number [phone] that Dr Riley Kill would like her to schedule a follow-up OV to discuss her ECHO results. Julieta Gutting, RN, BSN  February 19, 2010 1:28 PM  Left message on home answering machine for pt to call the office about echo results and need for OV. Julieta Gutting, RN, BSN  February 24, 2010 1:16 PM  Pt returning call for test results Judie Grieve  February 27, 2010 4:29 PM    Additional Follow-up for Phone Call Additional follow up Details #1::        PT AWARE OF ECHO RESULTS  IS WILLING TO COME IN AUGUST 31,2011  NEED TO GIVE TIME PER PT FEELS FINE.CALL PT ON CELL  [phone] .PER PT HAS BEEN STAYING IN  [place] HERE FOR [place]. Additional Follow-up by: Scherrie Bateman, LPN,  February 27, 2010 5:03 PM    Additional Follow-up for Phone Call Additional follow up Details #2::    I left a message for the pt to call back about appt to see Dr Riley Kill. Julieta Gutting, RN, BSN  March 04, 2010 12:18 PM  I spoke with the pt and reviewed her echo results.  The pt has not been monitoring her BP.  I asked the pt to get a BP cuff and start checking her BP 1-2 times a day.  The pt will bring these readings into her 03/18/10 appt.  Follow-up by: Julieta Gutting, RN, BSN,  March 04, 2010 12:33 PM   Appended Document: Echo results Patient returned for an office visit.  Findings reveiwed in detail.  Case discussed in detail.  Antihypertensive medicatioins  reviewed and potential causes reviewed.  Patient acknowledged receiving our letter, and also said she was not taking amlodipine.  Fortunately, she has has not touch alcohol in quite some time.  She does still smoke some, as her husband smokes, and says that makes it tough to quit.  Medication strategy reviewed, and possible side effects also reviewed.   MRI discussed.    TS

## 2010-08-18 NOTE — Letter (Signed)
Summary: Appointment - Cardiac MRI  [facility], Main Office  [address]   [place], [place] [address]   Phone: [phone]  Fax: [phone]      March 24, 2010 MRN: [id number]   GESSELLE FITZSIMONS [address] [place], [place]  [address]   Dear Ms. DURNEY,   We have scheduled the above patient for an appointment for a Cardiac MRI on 03-27-2010 at 9:00a.m.  Please refer to the below information for the location and instructions for this test:  Location:     [facility]       [address]       [place], [place]  [address] Instructions:    Wilmon Arms at [facility] Outpatient Registration 45 minutes prior to your appointment time.  This will ensure you are in the Radiology Department 30 minutes prior to your appointment.    There are no restrictions for this test you may eat and take medications as usual.  If you need to reschedule this appointment please call at the number listed above.  Sincerely,  Glass blower/designer

## 2010-08-20 NOTE — Progress Notes (Signed)
Summary: [name] denied  Phone Note Call from Patient   Caller: Patient [phone] Reason for Call: Talk to Nurse Summary of Call: pt calling re new med-insurance won't pay for it Initial call taken by: Glynda Jaeger,  June 01, 2010 3:48 PM  Follow-up for Phone Call        I spoke with the pt and she got a letter of denial for [name].  The medications  that are covered are diovan, diovan hct, losartan, losartan hct, micardis, and  micardis hct.  I will speak with Dr Riley Kill for further medication recommendations.  The pt has pending labwork and BP check--these will need to be rescheduled.   Follow-up by: Julieta Gutting, RN, BSN,  June 01, 2010 5:21 PM  Additional Follow-up for Phone Call Additional follow up Details #1::        I spoke with Dr Riley Kill and he would like the pt to take Losartan 50mg  daily. The pt will need to have a BP check and BMP on 06/17/10.  Rx sent to pharmacy.  I spoke with the pt and made her aware of Dr Rosalyn Charters recommendations.  The pt said she did not have her calendar with her and she thinks she already has some appointments scheduled on 11/30.  She will call me back on Thursday about scheduling appt.  Additional Follow-up by: Julieta Gutting, RN, BSN,  June 02, 2010 6:30 PM    Additional Follow-up for Phone Call Additional follow up Details #2::    I left a message for the pt to callback about rescheduling her BMP and BP check around 06/17/10. Julieta Gutting, RN, BSN  June 05, 2010 1:57 PM  Per pt calling back to speak with lauren [phone] Lorne Skeens  June 08, 2010 3:17 PM   Left message for pt to call back. Julieta Gutting, RN, BSN  June 10, 2010 4:02 PM  per pt calling back to speak with lauren  [phone] Lorne Skeens  June 10, 2010 4:10 PM   Left message to call back. Julieta Gutting, RN, BSN  June 10, 2010 4:53 PM  Left message to call back. Julieta Gutting, RN, BSN  June 23, 2010 1:39 PM  I spoke with the pt and she will  come into the office today for a BMP.  The pt has not been monitoring her BP at home.  I told the pt that we could check her BP today in the office when she comes in for labs.   Follow-up by: Julieta Gutting, RN, BSN,  June 24, 2010 11:13 AM  Additional Follow-up for Phone Call Additional follow up Details #3:: Details for Additional Follow-up Action Taken: The pt came into the office and had her labs checked.  I checked the pt's BP in Right Arm 148/110 and Left Arm 146/108.  The pt said she has been taking her medications.  I told the pt that we would get the results on her labwork and then make further adjustments in her medications. Julieta Gutting, RN, BSN  June 24, 2010 3:47 PM  Per Dr Riley Kill he would like the pt to start Losartan HCT 100/12.5mg .  I left a detailed message on the pt's voicemail.  New Rx sent to pharmacy. Julieta Gutting, RN, BSN  June 29, 2010 4:40 PM  I left a message for the pt to see if she received message from yesterday.  Julieta Gutting, RN, BSN  June 30, 2010 2:11 PM  This pt has a scheduled appt  with Dr Riley Kill on 07/03/10.  I will f/u with pt at appt. Additional Follow-up by: Julieta Gutting, RN, BSN,  July 01, 2010 9:32 AM  New/Updated Medications: LOSARTAN POTASSIUM 50 MG TABS (LOSARTAN POTASSIUM) take one tablet by mouth daily LOSARTAN POTASSIUM-HCTZ 100-12.5 MG TABS (LOSARTAN POTASSIUM-HCTZ) take one tablet by mouth daily Prescriptions: LOSARTAN POTASSIUM-HCTZ 100-12.5 MG TABS (LOSARTAN POTASSIUM-HCTZ) take one tablet by mouth daily  #30 x 6   Entered by:   Julieta Gutting, RN, BSN   Authorized by:   Ronaldo Miyamoto, MD, [facility]   Signed by:   Julieta Gutting, RN, BSN on 06/29/2010   Method used:   Electronically to        [id number]* (retail)       [address]       [place], [place]  [address]       Ph: [phone]       Fax: [phone]   RxID:   [id number] LOSARTAN POTASSIUM 50 MG TABS (LOSARTAN POTASSIUM) take one tablet by mouth daily   #30 x 3   Entered by:   Julieta Gutting, RN, BSN   Authorized by:   Ronaldo Miyamoto, MD, [facility]   Signed by:   Julieta Gutting, RN, BSN on 06/02/2010   Method used:   Electronically to        [facility]* (retail)       [address]       [place], [place]  [address]       Ph: [phone]       Fax: [phone]   RxID:   [id number]

## 2010-08-20 NOTE — Progress Notes (Signed)
Summary: BP and labwork  Phone Note Call from Patient   Caller: PT Summary of Call: I received a phone call from the pt that [place] and that she forgot about appt for labwork.   The pt has been monitoring her BP and it has been running 127/95 and for the past few days the BP has been around 115/84.  The pt said she would come and get her labs drawn when she comes back into town next Tuesday.  Initial call taken by: Julieta Gutting, RN, BSN,  July 22, 2010 11:45 AM

## 2010-08-20 NOTE — Assessment & Plan Note (Signed)
Summary: F6W/F/U ON BP   Visit Type:  Follow-up Primary Provider:  Dr.Robert Reade  CC:  No complaints.  History of Present Illness: [name] is getting along pretty well.  More diligent about her recent BP measures.  Denies chest pain.  Wants to have eye surgery.    Problems Prior to Update: 1)  Unspecified Congenital Defect of Septal Closure  (ICD-745.9) 2)  Unspecified Heart Failure  (ICD-428.9) 3)  Tobacco Abuse  (ICD-305.1) 4)  Cardiomyopathy, Secondary  (ICD-425.9) 5)  Hypertension, Unspecified  (ICD-401.9) 6)  Ventricular Septal Defect, Congenital  (ICD-745.4) 7)  Ventricular Tachycardia  (ICD-427.1) 8)  Dm  (ICD-250.00)  Current Medications (verified): 1)  Luvox Cr 100 Mg Xr24h-Cap (Fluvoxamine Maleate) .... Take 3 Capsules By Mouth At Bedtime 2)  Seroquel 300 Mg Tabs (Quetiapine Fumarate) .... Take 2 Tablets Daily 3)  Trazodone Hcl 100 Mg Tabs (Trazodone Hcl) .... Take 1 Tab By Mouth At Bedtime 4)  Synthroid 25 Mcg Tabs (Levothyroxine Sodium) .... Once Daily 5)  Wellbutrin Xl 300 Mg Xr24h-Tab (Bupropion Hcl) .... Take 1 Tablet By Mouth Once A Day 6)  Multivitamins  Tabs (Multiple Vitamin) .... Take 1 Tablet By Mouth Once A Day 7)  Aspirin 81 Mg Tbec (Aspirin) .... Take One Tablet By Mouth Daily 8)  Omeprazole 20 Mg Tbec (Omeprazole) .... Take 1 Tablet By Mouth Once A Day 9)  Metformin Hcl 1000 Mg Tabs (Metformin Hcl) .... Take 1 Tablet By Mouth Once Daily 10)  Gabapentin 300 Mg Caps (Gabapentin) .... Take 2 Caps At Bedtime 11)  Losartan Potassium-Hctz 100-12.5 Mg Tabs (Losartan Potassium-Hctz) .... Take One Tablet By Mouth Daily  Allergies (verified): No Known Drug Allergies  Vital Signs:  Patient profile:   55 year old female Height:      64 inches Weight:      198.25 pounds BMI:     34.15 Pulse rate:   110 / minute Pulse rhythm:   regular Resp:     18 per minute BP sitting:   116 / 86  (left arm) Cuff size:   large  Vitals Entered By: Vikki Ports (August 10, 2010 4:19 PM)  Physical Exam  General:  Well developed, well nourished, in no acute distress. Head:  normocephalic and atraumatic Eyes:  PERRLA/EOM intact; conjunctiva and lids normal. Lungs:  Clear bilaterally to auscultation and percussion. Heart:  Normal S1.  Loud ULSB holosystolic murmur compatable with VSD.  Pulses:  pulses normal in all 4 extremities Extremities:  No clubbing or cyanosis. Neurologic:  Alert and oriented x 3.   EKG  Procedure date:  07/03/2010  Findings:      Study Conclusions            - Left ventricle: The cavity size was moderately dilated. Wall       thickness was increased in a pattern of mild LVH. There was mild       concentric hypertrophy. Systolic function was mildly to moderately       reduced. The estimated ejection fraction was 40%, in the range of       40% to 45%. Diffuse hypokinesis. Hypokinesis of the entire       myocardium. Doppler parameters are consistent with abnormal left       ventricular relaxation (grade 1 diastolic dysfunction).     - Aortic valve: Mild regurgitation.     - Mitral valve: Mild regurgitation.     - Left atrium: The atrium was mildly dilated.     -  Atrial septum: No defect or patent foramen ovale was identified.     - Impressions: Perimembranous VSD seen best on basal short axis       images     Impressions:            - Perimembranous VSD seen best on basal short axis images  EKG  Procedure date:  08/10/2010  Findings:      Sinus tach.  Left axis deviation.  Nonspecific IVCD.  Impression & Recommendations:  Problem # 1:  HYPERTENSION, UNSPECIFIED (ICD-401.9) Her BP is clearly better.  Had a BMET and CO2 was 14, but repeat was more normal?  EF is clearly improved with better BP control, more suggesting that is was in fact the culprit.  Continue to monitor and check BMET. Her updated medication list for this problem includes:    Aspirin 81 Mg Tbec (Aspirin) .[name]... Take one tablet by mouth daily     Losartan Potassium-hctz 100-12.5 Mg Tabs (Losartan potassium-hctz) .[name]... Take one tablet by mouth daily  Orders: EKG w/ Interpretation (93000)  Problem # 2:  UNSPECIFIED CONGENITAL DEFECT OF SEPTAL CLOSURE (ICD-745.9) Chronic.  Shunt not really significant.   Problem # 3:  DM (ICD-250.00) Has early DM.  Findings reveiwed.  Her updated medication list for this problem includes:    Aspirin 81 Mg Tbec (Aspirin) .[name]... Take one tablet by mouth daily    Metformin Hcl 1000 Mg Tabs (Metformin hcl) .[name]... Take 1 tablet by mouth once daily    Losartan Potassium-hctz 100-12.5 Mg Tabs (Losartan potassium-hctz) .[name]... Take one tablet by mouth daily  Patient Instructions: 1)  Your physician recommends that you schedule a follow-up appointment in: 2 MONTHS 2)  Your physician recommends that you return for lab work in: 3 WEEKS (BMP) ---08/31/10, Lab hours 8:30-2:00 and 2:30-4:45 3)  Your physician recommends that you continue on your current medications as directed. Please refer to the Current Medication list given to you today. 4)  Your physician has requested that you regularly monitor and record your blood pressure readings at home.  Please use the same machine at the same time of day to check your readings and record them to bring to your follow-up visit. PLEASE CALL THE OFFICE ONCE A WEEK WITH BP AND PULSE.   Appended Document: F6W/F/U ON BP Fast heart rate noted today, but she just came in from new exercise routine.  TS

## 2010-08-20 NOTE — Assessment & Plan Note (Signed)
Summary: 6WK F/U    Visit Type:  6 weeks follow up Primary Provider:  Dr.Robert Nicholos Johns  CC:  no cardiac complaints.  History of Present Illness: Doing pretty well.  Not really checking BP.  It is better today on a higher dose of meds.  Need for continued feedback reemphasized, and echo reviewed with patient today.  Will need to continue to monitor closely.  Denies symptoms.  Talked PT and exercise with water aerobics.  Class I symptoms.  Of note, she uses about 1600 mg of ibuprofen per day.  Had issue with narcotics previously, and needs something.   Problems Prior to Update: 1)  Unspecified Congenital Defect of Septal Closure  (ICD-745.9) 2)  Unspecified Heart Failure  (ICD-428.9) 3)  Tobacco Abuse  (ICD-305.1) 4)  Cardiomyopathy, Secondary  (ICD-425.9) 5)  Hypertension, Unspecified  (ICD-401.9) 6)  Ventricular Septal Defect, Congenital  (ICD-745.4) 7)  Ventricular Tachycardia  (ICD-427.1) 8)  Dm  (ICD-250.00)  Current Medications (verified): 1)  Luvox Cr 100 Mg Xr24h-Cap (Fluvoxamine Maleate) .... Take 3 Capsules By Mouth At Bedtime 2)  Seroquel 300 Mg Tabs (Quetiapine Fumarate) .... Take 2 Tablets Daily 3)  Trazodone Hcl 100 Mg Tabs (Trazodone Hcl) .... Take 1 Tab By Mouth At Bedtime 4)  Synthroid 25 Mcg Tabs (Levothyroxine Sodium) .... Once Daily 5)  Wellbutrin Xl 300 Mg Xr24h-Tab (Bupropion Hcl) .... Take 1 Tablet By Mouth Once A Day 6)  Multivitamins  Tabs (Multiple Vitamin) 7)  Aspirin 81 Mg Tbec (Aspirin) .... Take One Tablet By Mouth Daily 8)  Omeprazole 20 Mg Tbec (Omeprazole) .... Take 1 Tablet By Mouth Once A Day 9)  Metformin Hcl 1000 Mg Tabs (Metformin Hcl) .... Take 1 Tablet By Mouth Once Daily 10)  Gabapentin 300 Mg Caps (Gabapentin) .... Take 2 Caps At Bedtime 11)  Losartan Potassium-Hctz 100-12.5 Mg Tabs (Losartan Potassium-Hctz) .... Take One Tablet By Mouth Daily  Allergies (verified): No Known Drug Allergies  Vital Signs:  Patient profile:   55 year old  female Height:      64 inches Weight:      198.25 pounds BMI:     34.15 Pulse rate:   96 / minute Pulse rhythm:   regular Resp:     18 per minute BP sitting:   124 / 88  (left arm) Cuff size:   large  Vitals Entered By: Vikki Ports (July 03, 2010 1:56 PM)  Physical Exam  General:  Well developed, well nourished, in no acute distress. Head:  normocephalic and atraumatic Lungs:  Clear bilaterally to auscultation and percussion. Heart:  Holosystolic murmur consistent with VSD at upper sternal edge.  Extremities:  No clubbing or cyanosis.  No edema.  Neurologic:  Alert and oriented x 3.   Impression & Recommendations:  Problem # 1:  CARDIOMYOPATHY, SECONDARY (ICD-425.9) EF remains about the same.  Now on higher dose meds with better BP control.  Of note, cannot tolerated beta blockade with history of fatigue, and depression.  However, we discussed possible eventual low dose use.  EF is down, but BP only in past few days has some modest level of control.  Therefore, continue medical therapy.  May consider need for ICD when there has been adequate BP control for more than three months.  As noted, she has been reluctant to check these at home. Her updated medication list for this problem includes:    Aspirin 81 Mg Tbec (Aspirin) .[name]... Take one tablet by mouth daily    Losartan  Potassium-hctz 100-12.5 Mg Tabs (Losartan potassium-hctz) .[name]... Take one tablet by mouth daily  Orders: TLB-BMP (Basic Metabolic Panel-BMET) (80048-METABOL)  Problem # 2:  HYPERTENSION, UNSPECIFIED (ICD-401.9)  see above. Her updated medication list for this problem includes:    Aspirin 81 Mg Tbec (Aspirin) .[name]... Take one tablet by mouth daily    Losartan Potassium-hctz 100-12.5 Mg Tabs (Losartan potassium-hctz) .[name]... Take one tablet by mouth daily  Her updated medication list for this problem includes:    Aspirin 81 Mg Tbec (Aspirin) .[name]... Take one tablet by mouth daily    Losartan Potassium-hctz  100-12.5 Mg Tabs (Losartan potassium-hctz) .[name]... Take one tablet by mouth daily  Orders: T-Basic Metabolic Panel [id number])  Problem # 3:  VENTRICULAR SEPTAL DEFECT, CONGENITAL (ICD-745.4) Small shunt, followed for many years.  See MRI.   Patient Instructions: 1)  Your physician has requested that you regularly monitor and record your blood pressure readings at home three times a week.  Please use the same machine at the same time of day to check your readings and record them to bring to your follow-up visit. PLEASE CALL THE OFFICE EVERY 2 WEEKS WITH BP READINGS.  2)  Your physician recommends that you have lab work today: BMP 3)  Your physician recommends that you continue on your current medications as directed. Please refer to the Current Medication list given to you today. 4)  Your physician recommends that you schedule a follow-up appointment in: 6 WEEKS

## 2010-08-20 NOTE — Progress Notes (Addendum)
Summary: pt needs surgical clearence  Phone Note From Other Clinic Call back at [phone]   Caller: lourdes from dr zaldivar Request: Talk with Nurse, Talk with Provider Summary of Call: pt needs surgical clearence for eye lid surgery fax # [phone] Initial call taken by: Omer Jack,  August 12, 2010 10:04 AM  Follow-up for Phone Call        Dr Riley Kill aware that the pt needs surgery. The pt will need to have 2 seperate procedures done.  One procedure on upper lid and then another procedure on lower lid.  Will send message to Dr Riley Kill for review.  Julieta Gutting, RN, BSN  August 12, 2010 11:58 AM   Additional Follow-up for Phone Call Additional follow up Details #1::        Below pt gave readings for the week and she wants to talk about her elective surgery   Monday 1/23....124/81 pulse was 88  Tuesday 1/24...127/93  pulse was 88  Wednesday 1/25....109/84 pulse was 89  Thursday 1/26....124/81 pulse was 88  Friday 1/27....136/78  pulse was 65  Lela Graham  August 14, 2010 11:21 AM   I spoke with the pt and let her know that her BP and pulse were excellent.  The pt also wanted to know if Dr Riley Kill had cleared her for surgery. The pt said Medicare has approved surgery through October 03, 2010 and that she has a pending appt with Dr Dimas Millin  08/31/10 at 11:00.  I will forward this information to Dr Riley Kill. Julieta Gutting, RN, BSN  August 14, 2010 4:00 PM Additional Follow-up by: Ronaldo Miyamoto, MD, [facility],  August 14, 2010 11:26 PM    Additional Follow-up for Phone Call Additional follow up Details #2::    Patient should be stable for surgery.  They should have facilities for monitoring and defibrillation.   Follow-up by: Ronaldo Miyamoto, MD, [facility],  August 14, 2010 11:26 PM

## 2010-08-26 ENCOUNTER — Telehealth: Payer: Self-pay | Admitting: Cardiology

## 2010-08-31 ENCOUNTER — Encounter: Payer: Self-pay | Admitting: Cardiology

## 2010-08-31 ENCOUNTER — Ambulatory Visit: Payer: Self-pay

## 2010-08-31 ENCOUNTER — Other Ambulatory Visit (INDEPENDENT_AMBULATORY_CARE_PROVIDER_SITE_OTHER): Payer: Medicare Other

## 2010-08-31 ENCOUNTER — Other Ambulatory Visit: Payer: Self-pay | Admitting: Cardiology

## 2010-08-31 DIAGNOSIS — Q210 Ventricular septal defect: Secondary | ICD-10-CM

## 2010-08-31 DIAGNOSIS — I429 Cardiomyopathy, unspecified: Secondary | ICD-10-CM

## 2010-08-31 DIAGNOSIS — I10 Essential (primary) hypertension: Secondary | ICD-10-CM

## 2010-08-31 LAB — BASIC METABOLIC PANEL
Calcium: 9.1 mg/dL (ref 8.4–10.5)
GFR: 96.87 mL/min (ref >60.00)
Glucose, Bld: 98 mg/dL (ref 70–99)
Potassium: 4.2 mEq/L (ref 3.5–5.1)
Sodium: 136 mEq/L (ref 135–145)

## 2010-09-03 NOTE — Progress Notes (Signed)
Summary: B/P AND PULSE READINGS   Phone Note Call from Patient   Caller: Patient Reason for Call: Talk to Nurse, Talk to Doctor Summary of Call: 1.30...126/81 PULSE 80  1.31...125/89 PULSE 91  2.1...129/86 PULSE 90  2.2...139/87  PULSE 88  2.3...126/80 PULSE 82 Initial call taken by: Omer Jack,  August 26, 2010 9:51 AM  Follow-up for Phone Call        These readings are for MD review (FYI).  Dr Riley Kill reviewed readings and no change in medications.  Follow-up by: Julieta Gutting, RN, BSN,  August 26, 2010 10:44 AM

## 2010-09-07 ENCOUNTER — Ambulatory Visit
Admission: RE | Admit: 2010-09-07 | Discharge: 2010-09-07 | Disposition: A | Discharge status: Home / Self Care | Payer: MEDICARE | Source: Ambulatory Visit | Attending: Obstetrics and Gynecology | Admitting: Obstetrics and Gynecology

## 2010-09-07 DIAGNOSIS — Z1239 Encounter for other screening for malignant neoplasm of breast: Secondary | ICD-10-CM

## 2010-09-29 ENCOUNTER — Other Ambulatory Visit: Payer: Self-pay | Admitting: Psychologist

## 2010-09-30 ENCOUNTER — Other Ambulatory Visit: Payer: Self-pay | Admitting: Psychologist

## 2010-10-13 ENCOUNTER — Encounter: Payer: Self-pay | Admitting: Cardiology

## 2010-10-15 ENCOUNTER — Ambulatory Visit: Payer: Self-pay | Admitting: Cardiology

## 2010-11-09 ENCOUNTER — Telehealth: Payer: Self-pay | Admitting: Cardiology

## 2010-11-09 DIAGNOSIS — I10 Essential (primary) hypertension: Secondary | ICD-10-CM

## 2010-11-09 DIAGNOSIS — I429 Cardiomyopathy, unspecified: Secondary | ICD-10-CM

## 2010-11-09 NOTE — Telephone Encounter (Signed)
Pt wants to talk to lauren re her blood pressure. Pt wants to know if she can come in sooner or can she wait it out.

## 2010-11-09 NOTE — Telephone Encounter (Signed)
I spoke with the pt and she missed her appt with Dr Riley Kill at the end of March.  The pt is not checking her BP at home but has seen PCP a few times for sinus infections and BP has been "great".  The pt wanted to know if she was okay to wait for an appt in June with Dr Riley Kill.  The pt will also be due for an Echo at that time. Dr Riley Kill aware and this will be okay.  I will have the scheduler contact her with an appointment.

## 2010-11-10 NOTE — Telephone Encounter (Signed)
I will send [name] a message to contact the pt with appointments. Order for Echo placed in system.
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
## 2010-12-01 NOTE — Assessment & Plan Note (Signed)
[facility]                                 ON-CALL NOTE   [name], Marissa                  MRN:          [id number]  DATE:02/29/2008                            DOB:          October 27, 1955    I spoke with [name] today.  I also spoke with Dr. Hinda Lenis, her primary  care physician.  When I saw [name] for preoperative evaluation, blood  pressure was 140/100.  We reviewed her echocardiogram which does show  her membranous VSD.  Overall, things have not changed dramatically,  although her overall ejection fraction appeared to be down just  slightly, at maybe 50%.  I have subsequently reviewed the films with Dr.  Myrtis Ser and Dr. Gala Romney.  In addition, I spoke with Dr. Nicholos Johns in detail.  [name]'s blood pressures have been reasonably controlled, and one day her  systolic pressure was under 100.  I suggested to [name] that we repeat  her echocardiogram in another 3 to 6 months.  Nonetheless, she is not  having any angina.  She does need her shoulder surgery, and she does not  have significant left ventricular dysfunction.  As a result, my  inclination would be to go ahead and let her have her shoulder surgery.  I have explained this to her.  At the present time, I do not think there  are any contraindications necessarily to proceeding.  She is to have  them notify us when she is admitted.     Arturo Morton. Riley Kill, MD, [facility]  Electronically Signed    TDS/MedQ  DD: 02/29/2008  DT: 02/29/2008  Job #: [id number]   cc:   Molly Maduro A. Nicholos Johns, M.D.  Katy Fitch Sypher, M.D.

## 2010-12-01 NOTE — Assessment & Plan Note (Signed)
[facility]                            CARDIOLOGY OFFICE NOTE   [name], [name]                  MRN:          [id number]  DATE:04/07/2007                            DOB:          Dec 26, 1955    [name] is in for a followup visit.  In general, she has been doing  relatively well.  Since I last saw her she has just had 1 quick episode  of chest discomfort.  She is almost certain it was related to stress.  She had sharp, jabbing chest pain.  There was no sweating or other  associated symptoms.  In general, she has been doing so much better.  She no longer drinks.  She does smoke up to 5 cigarettes a day.   Today the pulse was 103, the blood pressure was 120/90.  The lung fields were clear.  There was a holosystolic murmur compatible with mitral regurgitation.  The extremities revealed no edema.   Electrocardiogram demonstrates normal sinus rhythm with left __________  axis and delayed R-wave progression.  T-wave inversion as noted in 2004  is no longer present, although there is T-wave flattening.   [name] appears to be stable.  She is getting ready to have a wedding.  I  think that she is overdue for a 2D echocardiogram.  Other than that, I  would only recommend that she stop smoking at the present time.  We have  talked about this in some detail.  I have also encouraged her not to do  any scuba diving again.  We will see her back in followup in 1 year.     Arturo Morton. Riley Kill, MD, [facility]  Electronically Signed    TDS/MedQ  DD: 04/07/2007  DT: 04/08/2007  Job #: [id number]

## 2010-12-01 NOTE — Op Note (Signed)
NAME:  Marissa, [name]           ACCOUNT NO.:  [id number]   MEDICAL RECORD NO.:  [id number]          PATIENT TYPE:  AMB   LOCATION:  DSC                          FACILITY:  [facility]   PHYSICIAN:  Katy Fitch. Sypher, M.D. DATE OF BIRTH:  [birth year]   DATE OF PROCEDURE:  04/02/2008  DATE OF DISCHARGE:                               OPERATIVE REPORT   PREOPERATIVE DIAGNOSES:  Chronic right rotator cuff tear with  significant tendinopathy of subscapularis and retracted deep surface  rotator cuff tears of supination and infraspinatus documented by MRI in  June 2009.  Also, acromioclavicular arthropathy documented on plain  films and MRI examination.   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   OPERATION:  1. Examination of right shoulder under anesthesia.  2. Diagnostic arthroscopy of right shoulder with arthroscopic      debridement of labral tear, partial subscapularis grade 1 tear, and      adhesive capsulitis.  3. Arthroscopic subacromial decompression with bursectomy,      coracoacromial ligament relaxation, and acromioplasty.  4. Arthroscopic distal clavicle resection.  5. Open reconstruction of rotator cuff including entire supraspinatus      and intraspinous tendons with lateral advancement of retracted      rotator cuff tear.   OPERATING SURGEON:  Katy Fitch. Sypher, MD   ASSISTANT:  Marveen Reeks. Dasnoit, PA-C.   ANESTHESIA:  General endotracheal, supplemented by right interscalene  block.   SUPERVISING ANESTHESIOLOGIST:  Zenon Mayo, MD   INDICATIONS:  [name] is a 55 year old woman referred  through the courtesy of Dr. Elias Else for evaluation of chronic right  shoulder pain predicament.  [name] has a number of complex  background medical problems  including type 2 diabetes, a ventricular  septal defect, and mild increase of her pulmonary artery pressure.   She had a long-term patient of Dr. Riley Kill.   She presented for evaluation of a painful right shoulder and had plain  films and examination suggesting rotator cuff tear.  An MRI was obtained  in June 2009, which confirmed a rotator cuff tear and unfavorable AC  anatomy.   We advised her to consider diagnostic arthroscopy and appropriate  repairs.  Preoperatively, she was evaluated by Dr. Riley Kill with  contemporary cardiology evaluation and was ultimately cleared for  surgery.   After informed consent, she is brought to the operating room at this  time.   Preoperatively, Dr. Sampson Goon reviewed her cardiology information and  made adjustments to accommodate her VSD.  She was provided preoperative  prophylactic antibiotics 1 hour prior to surgery in the form of Ancef 1  g IV.   After informed consent, [name] is brought to the operating at this  time.   She has been advised of potential risks and benefits of surgery and  anesthesia.   PROCEDURE:  [name] was brought to the operating room and placed  in the supine position upon the operating table.  Following an anesthesia consult by Dr. Sampson Goon, a right interscalene  block was placed in the holding area.   This was well tolerated.   [name] was brought to room #6, placed in the supine position upon  the operating table, and under Dr. Jarrett Ables direct supervision,  general endotracheal anesthesia was induced.  She was carefully  positioned in the beach-chair position with limited elevation due to  concern about creating hypertension.   The entire right upper extremity forequarter was prepped with DuraPrep  and draped with impervious arthroscopy drapes.   Examination of the right shoulder under anesthesia revealed elevation  175 degrees combined, external rotation 85 degrees, at 9 degrees   abduction, and internal rotation 70 degrees.  She had minor adhesive  capsulitis with some restriction in extension.   The scope was introduced through a standard posterior viewing portal.  Diagnostic arthroscopy revealed grade II and III chondromalacia of the  humeral head.  This was documented in the digital camera.  The glenoid  head grade 2 chondromalacia also documented.  The labral degenerative  changes and anterior capsulitis were identified and debrided with the  suction shaver and brought in anteriorly.  The subscapularis had a grade  1 tear with a fragment of tendon prominent within the joint.  The biceps  had  limited tendinopathy.  The biceps origin was stable at the superior  labrum and the anterior, posterior, and inferior labrum were normal.  After debridement of the joint and debridement of subscapularis,  hemostasis was achieved followed by removal of the arthroscopic  equipment.  The scope was then placed in subacromial space.  After  bursectomy, the anatomy of the coracoacromial arch was studied.  The  prominent of inferior clavicle was identified.  Capsule was released  with the cutting cautery and the distal 15-mm clavicle was removed  arthroscopically with a suction bur.  The acromion was leveled to a type  1 morphology.  The coracoacromial ligament was relaxed with bone  removal.   We then removed the arthroscopic equipment and proceeded with a 6-cm  muscle-splitting incision anterior laterally.  The deltoid middle and  anterior thirds were split with release of the bursa.  The cuff tear was  obscured by bursa.  The bursa was released and retracted cuff tear was  identified.  The footprint of the supraspinatus and infraspinatus  tendons had released from the greater tuberosity.  The bursa was  released and the tuberosity was decorticated with a power bur, lowering  its profile 3 mm.  Two medial Bio-corkscrew anchors were placed, one at  the central portion of the  infraspinatus and one at the central portion  of the supraspinatus.  A grasping suture was placed at the conjoined  tendon and placed a McLaughlin drill holes through bone advancing the  tendon laterally about 1.5 cm.  The dog ear created was then resected  with scalpel and scissors.   After placement of 4 medial sutures to create a proper medial footprint,  the sutures were gathered with an over-the-top technique, creating an  excellent apposition of the tendon to the greater tuberosity utilizing a  lateral swivel lock anchor.   The subscapularis was directly visualized by placing traction on the  biceps and both palpating and studying the subscapularis insertion.  It  appeared that the subscapularis pathology was only grade 1 and therefore  formal repair was not accomplished.   After completion of the rotator cuff repair, hemostasis was achieved  followed by  irrigation of the subacromial space.  The scope was replaced  in the glenohumeral joint and a satisfactory footprint was confirmed  under direct vision.   The wound was then repaired with interrupted suture of 0 Vicryl closing  the deltoid split followed by repair of the skin with subcutaneous  suture of 2-0 Vicryl and intradermal 3-0 Prolene segmental suture.   Ms. Kos tolerated surgery and anesthesia well.  She was transferred  to the recovery room with stable signs.   She will be admitted to the recovery care center for observation of  vital signs.   I will contact her cardiologist, Dr. Riley Kill postoperatively to be  certain that he is aware of her observational status.    Note that we did not have any significant anesthesia or surgical  complications recognized during surgery.      Katy Fitch Sypher, M.D.  Electronically Signed     RVS/MEDQ  D:  04/02/2008  T:  04/02/2008  Job:  [id number]   cc:   Molly Maduro A. Nicholos Johns, M.D.

## 2011-01-13 ENCOUNTER — Encounter: Payer: Self-pay | Admitting: Cardiology

## 2011-01-13 ENCOUNTER — Ambulatory Visit (HOSPITAL_COMMUNITY): Payer: Medicare Other | Attending: Cardiology | Admitting: Radiology

## 2011-01-13 ENCOUNTER — Ambulatory Visit (INDEPENDENT_AMBULATORY_CARE_PROVIDER_SITE_OTHER): Payer: Medicare Other | Admitting: Cardiology

## 2011-01-13 VITALS — BP 110/78 | HR 83 | Ht 64.0 in | Wt 196.8 lb

## 2011-01-13 DIAGNOSIS — I429 Cardiomyopathy, unspecified: Secondary | ICD-10-CM

## 2011-01-13 DIAGNOSIS — I379 Nonrheumatic pulmonary valve disorder, unspecified: Secondary | ICD-10-CM | POA: Insufficient documentation

## 2011-01-13 DIAGNOSIS — I4729 Other ventricular tachycardia: Secondary | ICD-10-CM

## 2011-01-13 DIAGNOSIS — I510 Cardiac septal defect, acquired: Secondary | ICD-10-CM | POA: Insufficient documentation

## 2011-01-13 DIAGNOSIS — I472 Ventricular tachycardia: Secondary | ICD-10-CM

## 2011-01-13 DIAGNOSIS — E785 Hyperlipidemia, unspecified: Secondary | ICD-10-CM | POA: Insufficient documentation

## 2011-01-13 DIAGNOSIS — I10 Essential (primary) hypertension: Secondary | ICD-10-CM

## 2011-01-13 DIAGNOSIS — I079 Rheumatic tricuspid valve disease, unspecified: Secondary | ICD-10-CM | POA: Insufficient documentation

## 2011-01-13 DIAGNOSIS — Q210 Ventricular septal defect: Secondary | ICD-10-CM

## 2011-01-13 DIAGNOSIS — E669 Obesity, unspecified: Secondary | ICD-10-CM | POA: Insufficient documentation

## 2011-01-13 DIAGNOSIS — I080 Rheumatic disorders of both mitral and aortic valves: Secondary | ICD-10-CM | POA: Insufficient documentation

## 2011-01-13 DIAGNOSIS — F17200 Nicotine dependence, unspecified, uncomplicated: Secondary | ICD-10-CM | POA: Insufficient documentation

## 2011-01-21 NOTE — Assessment & Plan Note (Signed)
Biggest issue of concern.  Seems to have improved with better control of BP.  She is monitoring, has primary care follow up, and labs are being checked.  She understands importance.  Will await official reading.

## 2011-01-21 NOTE — Assessment & Plan Note (Signed)
Now well controlled, and she is compliant.

## 2011-01-21 NOTE — Progress Notes (Signed)
HPI:  [name] is really doing quite well.  Her BP has been better controlled. We reviewed her echocardiogram in the office today, and it has yet to be read officially.  Tolerating excellent doses of combination of ARB with diuretic, and has closer follow up with Dr. Azucena Kuba.  Importance of scrupulous monitoring is emphasized to the patient.  She expresses understanding.    Current Outpatient Prescriptions  Medication Sig Dispense Refill  . aspirin 81 MG tablet Take 81 mg by mouth daily.        [name] buPROPion (WELLBUTRIN XL) 300 MG 24 hr tablet Take 300 mg by mouth daily.        . fluvoxaMINE (LUVOX) 100 MG tablet 100 mg. Take 3 capsules by mouth at bed time       . gabapentin (NEURONTIN) 300 MG capsule 300 mg. Take 2 cap. At bedtime       . levothyroxine (SYNTHROID, LEVOTHROID) 25 MCG tablet Take 25 mcg by mouth daily.        [name] losartan-hydrochlorothiazide (HYZAAR) 100-12.5 MG per tablet Take 1 tablet by mouth daily.        . metFORMIN (GLUCOPHAGE) 1000 MG tablet Take 1,000 mg by mouth daily.        . Multiple Vitamin (MULTIVITAMIN) tablet Take 1 tablet by mouth daily.        [name] omeprazole (PRILOSEC OTC) 20 MG tablet Take 20 mg by mouth daily.        . QUEtiapine (SEROQUEL) 300 MG tablet Take 600 mg by mouth at bedtime.        . traZODone (DESYREL) 100 MG tablet Take 100 mg by mouth at bedtime.          No Known Allergies  Past Medical History  Diagnosis Date  . Hypertension   . Ventricular septal defect   . Paroxysmal ventricular tachycardia   . Diabetes mellitus     Past Surgical History  Procedure Date  . Shoulder arthroscopy     right  . Hernia repair     Family History  Problem Relation Age of Onset  . Stroke Father 80    deceased  . Other Mother     alive  . Heart attack Other     multiple uncles have died with myocardial infaarction    History   Social History  . Marital Status: Married    Spouse Name: N/A    Number of Children: N/A  . Years of Education: N/A    Occupational History  . teacher     former   Social History Main Topics  . Smoking status: Current Everyday Smoker    Types: Cigarettes  . Smokeless tobacco: Never Used  . Alcohol Use: No  . Drug Use: No  . Sexually Active: Not on file   Other Topics Concern  . Not on file   Social History Narrative  . No narrative on file    ROS: Please see the HPI.  All other systems reviewed and negative.  PHYSICAL EXAM:  BP 110/78  Pulse 83  Ht 5\' 4"  (1.626 m)  Wt 196 lb 12.8 oz (89.268 kg)  BMI 33.78 kg/m2  General: Well developed, well nourished, in no acute distress. Head:  Normocephalic and atraumatic. Neck: no JVD Lungs: Clear to auscultation and percussion. Heart: Normal S1 and S2.  No murmur, rubs or gallops.  Abdomen:  Normal bowel sounds; soft; non tender; no organomegaly Pulses: Pulses normal in all 4 extremities. Extremities: No clubbing or  cyanosis. No edema. Neurologic: Alert and oriented x 3.  EKG:  NSR.  IVCD.  Delay in R wave progression.  T waves stable.   ASSESSMENT AND PLAN:

## 2011-01-21 NOTE — Assessment & Plan Note (Signed)
Unchanged from present.

## 2011-01-29 ENCOUNTER — Telehealth: Payer: Self-pay | Admitting: Cardiology

## 2011-01-29 MED ORDER — LOSARTAN POTASSIUM-HCTZ 100-12.5 MG PO TABS: 1.0000 | ORAL_TABLET | Freq: Every day | ORAL | Status: DC

## 2011-01-29 NOTE — Telephone Encounter (Signed)
Sent in Hyzaar which is losartan and hctz together this is the only cardiac med I see  That starts with a L called pt to get further information about the medication that she needed filled  but know answer. Left a message for the pt to call me back

## 2011-01-29 NOTE — Telephone Encounter (Signed)
Per pt call, pt needs RX called in to [facility] on [address] [phone]. Pt does not know name of medication but know that it begins with the letter "L". Pt said she has been out of RX for two days. Per pt pharmacy sent fax requesting refill order yesterday.

## 2011-02-17 ENCOUNTER — Other Ambulatory Visit: Payer: Self-pay | Admitting: Family Medicine

## 2011-02-17 DIAGNOSIS — R7989 Other specified abnormal findings of blood chemistry: Secondary | ICD-10-CM

## 2011-02-18 ENCOUNTER — Ambulatory Visit
Admission: RE | Admit: 2011-02-18 | Discharge: 2011-02-18 | Disposition: A | Discharge status: Home / Self Care | Payer: Medicare Other | Source: Ambulatory Visit | Attending: Family Medicine | Admitting: Family Medicine

## 2011-02-18 DIAGNOSIS — R7989 Other specified abnormal findings of blood chemistry: Secondary | ICD-10-CM

## 2011-03-09 ENCOUNTER — Encounter: Payer: Self-pay | Admitting: Cardiology

## 2011-04-16 LAB — BASIC METABOLIC PANEL
BUN: 11
CO2: 23
Calcium: 9.1
Creatinine, Ser: 0.9
GFR calc non Af Amer: >60
Glucose, Bld: 106 — ABNORMAL HIGH
Sodium: 139

## 2011-04-19 LAB — GLUCOSE, CAPILLARY: Glucose-Capillary: 128 — ABNORMAL HIGH

## 2011-04-21 LAB — BASIC METABOLIC PANEL
CO2: 24
Calcium: 9.1
Glucose, Bld: 136 — ABNORMAL HIGH
Sodium: 138

## 2011-08-18 ENCOUNTER — Other Ambulatory Visit: Payer: Self-pay | Admitting: Obstetrics and Gynecology

## 2011-08-18 ENCOUNTER — Other Ambulatory Visit: Payer: Self-pay | Admitting: Family Medicine

## 2011-08-18 DIAGNOSIS — Z1231 Encounter for screening mammogram for malignant neoplasm of breast: Secondary | ICD-10-CM

## 2011-09-10 ENCOUNTER — Ambulatory Visit (INDEPENDENT_AMBULATORY_CARE_PROVIDER_SITE_OTHER): Payer: Medicare Other | Admitting: Cardiology

## 2011-09-10 ENCOUNTER — Encounter: Payer: Self-pay | Admitting: Cardiology

## 2011-09-10 DIAGNOSIS — I4729 Other ventricular tachycardia: Secondary | ICD-10-CM

## 2011-09-10 DIAGNOSIS — Q219 Congenital malformation of cardiac septum, unspecified: Secondary | ICD-10-CM

## 2011-09-10 DIAGNOSIS — I472 Ventricular tachycardia: Secondary | ICD-10-CM

## 2011-09-10 DIAGNOSIS — I10 Essential (primary) hypertension: Secondary | ICD-10-CM

## 2011-09-10 DIAGNOSIS — Q210 Ventricular septal defect: Secondary | ICD-10-CM

## 2011-09-10 DIAGNOSIS — I429 Cardiomyopathy, unspecified: Secondary | ICD-10-CM

## 2011-09-10 DIAGNOSIS — I509 Heart failure, unspecified: Secondary | ICD-10-CM

## 2011-09-10 NOTE — Patient Instructions (Signed)
Your physician has requested that you have an echocardiogram. Echocardiography is a painless test that uses sound waves to create images of your heart. It provides your doctor with information about the size and shape of your heart and how well your heart's chambers and valves are working. This procedure takes approximately one hour. There are no restrictions for this procedure.  Your physician recommends that you schedule a follow-up appointment in: 3 MONTH

## 2011-09-13 ENCOUNTER — Ambulatory Visit
Admission: RE | Admit: 2011-09-13 | Discharge: 2011-09-13 | Disposition: A | Discharge status: Home / Self Care | Payer: Medicare Other | Source: Ambulatory Visit | Attending: Family Medicine | Admitting: Family Medicine

## 2011-09-13 DIAGNOSIS — Z1231 Encounter for screening mammogram for malignant neoplasm of breast: Secondary | ICD-10-CM

## 2011-09-17 NOTE — Progress Notes (Signed)
HPI:  [name] is in for follow up.  She is doing pretty well.  We spent a long time discussing her echo findings--see report.  Since I saw her last, she has had two episodes of some chest pain.  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  [place] five minutes.  She was in bed reading.   There was no shortness of breath, or diaphoresis, nor significant radiation.  She has been followed closely by Dr. Azucena Kuba recently with what has thought to be a nonalcoholic fatty liver.  She has not been drinking at all.    Current Outpatient Prescriptions  Medication Sig Dispense Refill  . aspirin 81 MG tablet Take 81 mg by mouth daily.        [name] buPROPion (WELLBUTRIN XL) 300 MG 24 hr tablet Take 300 mg by mouth daily.        . fluvoxaMINE (LUVOX) 100 MG tablet 100 mg. Take 3 capsules by mouth at bed time       . gabapentin (NEURONTIN) 300 MG capsule 300 mg. Take 2 cap. At bedtime       . HYDROcodone-acetaminophen (NORCO) 10-325 MG per tablet Take 1 tablet by mouth every 4 (four) hours as needed.       [name] levothyroxine (SYNTHROID, LEVOTHROID) 25 MCG tablet Take 25 mcg by mouth daily.        [name] losartan-hydrochlorothiazide (HYZAAR) 100-12.5 MG per tablet Take 1 tablet by mouth daily.  30 tablet  12  . meloxicam (MOBIC) 15 MG tablet Take 15 mg by mouth as needed.       . metFORMIN (GLUCOPHAGE) 1000 MG tablet Take 1,000 mg by mouth daily.        . Multiple Vitamin (MULTIVITAMIN) tablet Take 1 tablet by mouth daily.        [name] omeprazole (PRILOSEC OTC) 20 MG tablet Take 20 mg by mouth daily.        . QUEtiapine (SEROQUEL) 300 MG tablet Take 600 mg by mouth at bedtime.        . traZODone (DESYREL) 100 MG tablet Take 100 mg by mouth at bedtime.          No Known Allergies  Past Medical History  Diagnosis  Date  . Hypertension   . Ventricular septal defect   . Paroxysmal ventricular tachycardia   . Diabetes mellitus     Past Surgical History  Procedure Date  . Shoulder arthroscopy     right  . Hernia repair     Family History  Problem Relation Age of Onset  . Stroke Father 71    deceased  . Other Mother     alive  . Heart attack Other     multiple uncles have died with myocardial infaarction    History   Social History  . Marital Status: Married    Spouse Name: N/A    Number of Children: N/A  . Years of Education: N/A   Occupational History  . teacher     former   Social History Main Topics  . Smoking status: Current Everyday Smoker    Types: Cigarettes  . Smokeless tobacco: Never Used  . Alcohol Use: No  . Drug Use: No  . Sexually Active: Not on file  Other Topics Concern  . Not on file   Social History Narrative  . No narrative on file    ROS: Please see the HPI.  All other systems reviewed and negative.  PHYSICAL EXAM:  BP 118/74  Pulse 97  Ht 5\' 4"  (1.626 m)  Wt 198 lb 1.9 oz (89.867 kg)  BMI 34.01 kg/m2  General: Well developed, well nourished, in no acute distress. Head:  Normocephalic and atraumatic. Neck: no JVD Lungs: Clear to auscultation and percussion. Heart: Normal S1 and S2.  Loud ULSE holosystolic murmur  (secondary to her congenital VSD.   Abdomen:  Normal bowel sounds; soft; non tender; no organomegaly Pulses: Pulses normal in all 4 extremities. Extremities: No clubbing or cyanosis. No edema. Neurologic: Alert and oriented x 3.  EKG:  NSR.  Possible LAE.  LAFB.  Nonspecific T wave abnormality.    ASSESSMENT AND PLAN:

## 2011-09-19 NOTE — Assessment & Plan Note (Signed)
Much improved control 

## 2011-09-19 NOTE — Assessment & Plan Note (Signed)
Prior ablation.

## 2011-09-19 NOTE — Assessment & Plan Note (Addendum)
Her BP is much better controlled at this point.  Her EF has been down for unclear reasons.  She had hypertension which was not controlled for some time, but is much better controlled now.  She has had a prior VT ablation.  She has had a congenital VSD, thought to be small both by cath in the past and MR.  Her MR does not show delayed enhancement to suggest myocardial scar or myopathy, and the shunt fraction was low.  Her BP control has been better, so we will see in follow up what her function appears to be.  Her functional class presently is excellent.  Close follow up is suggested.  She is on an ACE but has not been on beta blockers because of psychotropic meds which has been an issue. With her BP controlled, would hope to have better LV function.  Will monitor.

## 2011-09-19 NOTE — Assessment & Plan Note (Signed)
See echoes.  Small shunt by cath and MR.

## 2011-09-30 DIAGNOSIS — M1990 Unspecified osteoarthritis, unspecified site: Secondary | ICD-10-CM | POA: Insufficient documentation

## 2011-10-07 ENCOUNTER — Ambulatory Visit (HOSPITAL_COMMUNITY): Payer: Medicare Other

## 2011-10-11 ENCOUNTER — Ambulatory Visit (HOSPITAL_COMMUNITY): Payer: Medicare Other | Attending: Internal Medicine

## 2011-10-11 ENCOUNTER — Other Ambulatory Visit: Payer: Self-pay

## 2011-10-11 DIAGNOSIS — F17200 Nicotine dependence, unspecified, uncomplicated: Secondary | ICD-10-CM | POA: Insufficient documentation

## 2011-10-11 DIAGNOSIS — Q210 Ventricular septal defect: Secondary | ICD-10-CM

## 2011-10-11 DIAGNOSIS — I472 Ventricular tachycardia, unspecified: Secondary | ICD-10-CM

## 2011-10-11 DIAGNOSIS — I4729 Other ventricular tachycardia: Secondary | ICD-10-CM

## 2011-10-11 DIAGNOSIS — E119 Type 2 diabetes mellitus without complications: Secondary | ICD-10-CM | POA: Insufficient documentation

## 2011-10-11 DIAGNOSIS — I510 Cardiac septal defect, acquired: Secondary | ICD-10-CM | POA: Insufficient documentation

## 2011-10-11 DIAGNOSIS — I429 Cardiomyopathy, unspecified: Secondary | ICD-10-CM

## 2011-10-11 DIAGNOSIS — I509 Heart failure, unspecified: Secondary | ICD-10-CM

## 2011-10-11 DIAGNOSIS — I10 Essential (primary) hypertension: Secondary | ICD-10-CM

## 2011-10-11 DIAGNOSIS — Q219 Congenital malformation of cardiac septum, unspecified: Secondary | ICD-10-CM

## 2011-12-14 ENCOUNTER — Ambulatory Visit: Payer: Medicare Other | Admitting: Cardiology

## 2011-12-16 ENCOUNTER — Ambulatory Visit: Payer: Medicare Other | Admitting: Cardiology

## 2012-02-02 ENCOUNTER — Other Ambulatory Visit: Payer: Self-pay | Admitting: *Deleted

## 2012-02-02 MED ORDER — LOSARTAN POTASSIUM-HCTZ 100-12.5 MG PO TABS: 1.0000 | ORAL_TABLET | Freq: Every day | ORAL | Status: DC

## 2012-03-06 ENCOUNTER — Encounter: Payer: Self-pay | Admitting: Cardiology

## 2012-03-10 ENCOUNTER — Other Ambulatory Visit: Payer: Self-pay | Admitting: Orthopedic Surgery

## 2012-03-10 ENCOUNTER — Ambulatory Visit
Admission: RE | Admit: 2012-03-10 | Discharge: 2012-03-10 | Disposition: A | Discharge status: Home / Self Care | Payer: Medicare Other | Source: Ambulatory Visit | Attending: Orthopedic Surgery | Admitting: Orthopedic Surgery

## 2012-03-10 ENCOUNTER — Other Ambulatory Visit: Payer: Self-pay | Admitting: Pain Medicine

## 2012-03-10 DIAGNOSIS — M545 Low back pain, unspecified: Secondary | ICD-10-CM

## 2012-03-10 DIAGNOSIS — R52 Pain, unspecified: Secondary | ICD-10-CM

## 2012-03-13 ENCOUNTER — Ambulatory Visit
Admission: RE | Admit: 2012-03-13 | Discharge: 2012-03-13 | Disposition: A | Discharge status: Home / Self Care | Payer: Medicare Other | Source: Ambulatory Visit | Attending: Pain Medicine | Admitting: Pain Medicine

## 2012-03-13 DIAGNOSIS — M545 Low back pain, unspecified: Secondary | ICD-10-CM

## 2012-03-23 ENCOUNTER — Ambulatory Visit (INDEPENDENT_AMBULATORY_CARE_PROVIDER_SITE_OTHER): Payer: Medicare Other | Admitting: Cardiology

## 2012-03-23 ENCOUNTER — Encounter: Payer: Self-pay | Admitting: Cardiology

## 2012-03-23 VITALS — BP 124/90 | HR 111 | Ht 64.0 in | Wt 191.8 lb

## 2012-03-23 DIAGNOSIS — Q210 Ventricular septal defect: Secondary | ICD-10-CM

## 2012-03-23 DIAGNOSIS — F17200 Nicotine dependence, unspecified, uncomplicated: Secondary | ICD-10-CM

## 2012-03-23 DIAGNOSIS — I10 Essential (primary) hypertension: Secondary | ICD-10-CM

## 2012-03-23 DIAGNOSIS — I472 Ventricular tachycardia, unspecified: Secondary | ICD-10-CM

## 2012-03-23 DIAGNOSIS — I4729 Other ventricular tachycardia: Secondary | ICD-10-CM

## 2012-03-23 DIAGNOSIS — I429 Cardiomyopathy, unspecified: Secondary | ICD-10-CM

## 2012-03-23 NOTE — Patient Instructions (Signed)
Your physician wants you to follow-up in: MARCH 2014. You will receive a reminder letter in the mail two months in advance. If you don't receive a letter, please call our office to schedule the follow-up appointment.  Your physician recommends that you continue on your current medications as directed. Please refer to the Current Medication list given to you today.  

## 2012-03-25 NOTE — Progress Notes (Signed)
HPI:  This very nice patient Mr. last appointment, and I had a little bit of trouble getting in contact with her with regard to her echocardiograms. Overall, she seems to be doing pretty well. She is painting on a regular basis. She's lost some weight, and has remained alcohol free. Some of her pains are being displayed at [facility] lights she is excited about. She voices no current complaints.  Has had NCV.  No syncope or presyncope.   Current Outpatient Prescriptions  Medication Sig Dispense Refill  . aspirin 81 MG tablet Take 81 mg by mouth daily.        [name] buPROPion (WELLBUTRIN XL) 300 MG 24 hr tablet Take 300 mg by mouth daily.        . fluvoxaMINE (LUVOX) 100 MG tablet 100 mg. Take 3 capsules by mouth at bed time       . gabapentin (NEURONTIN) 300 MG capsule 300 mg. Take 2 cap. At bedtime       . levothyroxine (SYNTHROID, LEVOTHROID) 25 MCG tablet Take 25 mcg by mouth daily.        [name] losartan-hydrochlorothiazide (HYZAAR) 100-12.5 MG per tablet Take 1 tablet by mouth daily.  30 tablet  9  . meloxicam (MOBIC) 15 MG tablet Take 15 mg by mouth as needed.       . metFORMIN (GLUCOPHAGE) 1000 MG tablet Take 1,000 mg by mouth daily.        [name] omeprazole (PRILOSEC OTC) 20 MG tablet Take 20 mg by mouth daily.        . QUEtiapine (SEROQUEL) 300 MG tablet Take 600 mg by mouth at bedtime.        . traZODone (DESYREL) 100 MG tablet Take 100 mg by mouth at bedtime.        . Multiple Vitamin (MULTIVITAMIN) tablet Take 1 tablet by mouth daily.          No Known Allergies  Past Medical History  Diagnosis Date  . Hypertension   . Ventricular septal defect   . Paroxysmal ventricular tachycardia   . Diabetes mellitus     Past Surgical History  Procedure Date  . Shoulder arthroscopy     right  . Hernia repair     Family History  Problem Relation Age of Onset  . Stroke Father 74    deceased  . Other Mother     alive  . Heart attack Other     multiple uncles have died with myocardial  infaarction    History   Social History  . Marital Status: Married    Spouse Name: N/A    Number of Children: N/A  . Years of Education: N/A   Occupational History  . teacher     former   Social History Main Topics  . Smoking status: Current Everyday Smoker    Types: Cigarettes  . Smokeless tobacco: Never Used  . Alcohol Use: No  . Drug Use: No  . Sexually Active: Not on file   Other Topics Concern  . Not on file   Social History Narrative  . No narrative on file    ROS: Please see the HPI.  All other systems reviewed and negative.  PHYSICAL EXAM:  BP 124/90  Pulse 111  Ht 5\' 4"  (1.626 m)  Wt 191 lb 12.8 oz (87 kg)  BMI 32.92 kg/m2  SpO2 96%  General: Modestly overweight female in no acute distress. Head:  Normocephalic and atraumatic. Neck: no JVD Lungs: Clear to  auscultation and percussion. Heart: Normal S1 and S2.  Holosystolic murmur at the apex.   Pulses: Pulses normal in all 4 extremities. Extremities: No clubbing or cyanosis. No edema. Neurologic: Alert and oriented x 3.  EKG:  NSR.  Left anterior fascicular block.  Left axis deviation.  ECHO  09/2011  Study Conclusions  - Left ventricle: Small perimembranous VSD. The cavity size was normal. Wall thickness was increased in a pattern of mild LVH. Systolic function was normal. The estimated ejection fraction was in the range of 50% to 55%. Doppler parameters are consistent with abnormal left ventricular relaxation (grade 1 diastolic dysfunction). - Aortic valve: Mild regurgitation. - Pulmonary arteries: PA peak pressure: 32mm Hg (S). Impressions:  - No significant change from echo of Summer 2012.    ASSESSMENT AND PLAN:

## 2012-04-11 NOTE — Assessment & Plan Note (Signed)
LV overall is better with control of BP.  Would not change at this point in time.  Being nicely managed by her primary MD.  Would not change meds as she also has DM, and ACE is a good choice.  She needs serial echoes going forward.

## 2012-04-11 NOTE — Assessment & Plan Note (Signed)
Unchanged

## 2012-04-11 NOTE — Assessment & Plan Note (Signed)
Reasonable control. 

## 2012-04-11 NOTE — Assessment & Plan Note (Signed)
Longstanding

## 2012-04-11 NOTE — Assessment & Plan Note (Signed)
>>  ASSESSMENT AND PLAN FOR TOBACCO ABUSE WRITTEN ON 04/11/2012 11:48 AM BY Herby Abraham, MD  Unchanged.

## 2012-04-11 NOTE — Assessment & Plan Note (Signed)
No clinical recurrence.  Remote cryoablation at [facility]

## 2012-09-15 ENCOUNTER — Other Ambulatory Visit: Payer: Self-pay | Admitting: *Deleted

## 2012-09-19 ENCOUNTER — Ambulatory Visit: Payer: Medicare Other | Admitting: Cardiology

## 2012-09-21 ENCOUNTER — Ambulatory Visit: Payer: Medicare Other | Admitting: Cardiology

## 2012-10-06 ENCOUNTER — Encounter: Payer: Self-pay | Admitting: Cardiology

## 2012-10-06 ENCOUNTER — Ambulatory Visit (INDEPENDENT_AMBULATORY_CARE_PROVIDER_SITE_OTHER): Payer: Medicare Other | Admitting: Cardiology

## 2012-10-06 VITALS — BP 126/80 | HR 98 | Wt 200.0 lb

## 2012-10-06 DIAGNOSIS — F17200 Nicotine dependence, unspecified, uncomplicated: Secondary | ICD-10-CM

## 2012-10-06 DIAGNOSIS — I472 Ventricular tachycardia: Secondary | ICD-10-CM

## 2012-10-06 DIAGNOSIS — I4729 Other ventricular tachycardia: Secondary | ICD-10-CM

## 2012-10-06 DIAGNOSIS — Q219 Congenital malformation of cardiac septum, unspecified: Secondary | ICD-10-CM

## 2012-10-06 DIAGNOSIS — I10 Essential (primary) hypertension: Secondary | ICD-10-CM

## 2012-10-06 DIAGNOSIS — Q210 Ventricular septal defect: Secondary | ICD-10-CM

## 2012-10-06 NOTE — Progress Notes (Signed)
HPI:  This patient returns in a followup visit. From a cardiac standpoint she seems to be doing well. She has an underlying membranous VSD, and has had a complex prior history with regard to ventricular arrhythmias. She had cryoablation treatment at [facility] in [place] many years ago. She has not had syncope or presyncope since. She did have reduction in her overall left ventricular function, probably most closely associated with poor control of hypertension. She was subsequently been under the careful care of Dr. Nicholos Johns and with that her BP and her LVEF have improved.  He is been following her closely. She does continue to smoke one to 2 cigarettes per day, and because of her multiple orthopedic issues she is remained on narcotic analgesics as well as meloxicam.  She rarely to occasionally gets mild chest pain, no different than he ever has.    Current Outpatient Prescriptions  Medication Sig Dispense Refill  . aspirin 81 MG tablet Take 81 mg by mouth daily.        [name] buPROPion (WELLBUTRIN XL) 300 MG 24 hr tablet Take 300 mg by mouth daily.        . celecoxib (CELEBREX) 200 MG capsule Take 200 mg by mouth 2 (two) times daily.      . fluvoxaMINE (LUVOX) 100 MG tablet 100 mg. Take 3 capsules by mouth at bed time       . gabapentin (NEURONTIN) 300 MG capsule 300 mg. Take 2 cap. At bedtime       . levothyroxine (SYNTHROID, LEVOTHROID) 25 MCG tablet Take 25 mcg by mouth daily.        [name] losartan-hydrochlorothiazide (HYZAAR) 100-12.5 MG per tablet Take 1 tablet by mouth daily.  30 tablet  9  . meloxicam (MOBIC) 15 MG tablet Take 15 mg by mouth as needed.       . metFORMIN (GLUCOPHAGE) 1000 MG tablet Take 1,000 mg by mouth daily.        . Multiple Vitamin (MULTIVITAMIN) tablet Take 1 tablet by mouth daily.        [name] omeprazole (PRILOSEC OTC) 20 MG tablet Take 20 mg by mouth daily.        [name] oxyCODONE-acetaminophen (PERCOCET) 5-325 MG per tablet Take 1 tablet by mouth every 4 (four) hours as  needed for pain.      [name] QUEtiapine (SEROQUEL) 300 MG tablet Take 600 mg by mouth at bedtime.        . traZODone (DESYREL) 100 MG tablet Take 100 mg by mouth at bedtime.         No current facility-administered medications for this visit.    No Known Allergies  Past Medical History  Diagnosis Date  . Hypertension   . Ventricular septal defect   . Paroxysmal ventricular tachycardia   . Diabetes mellitus     Past Surgical History  Procedure Laterality Date  . Shoulder arthroscopy      right  . Hernia repair      Family History  Problem Relation Age of Onset  . Stroke Father 36    deceased  . Other Mother     alive  . Heart attack Other     multiple uncles have died with myocardial infaarction    History   Social History  . Marital Status: Married    Spouse Name: N/A    Number of Children: N/A  . Years of Education: N/A   Occupational History  . teacher     former  Social History Main Topics  . Smoking status: Current Every Day Smoker    Types: Cigarettes  . Smokeless tobacco: Never Used  . Alcohol Use: No  . Drug Use: No  . Sexually Active: Not on file   Other Topics Concern  . Not on file   Social History Narrative  . No narrative on file    ROS: Please see the HPI.  All other systems reviewed and negative.  PHYSICAL EXAM:  BP 126/80  Pulse 98  Wt 200 lb (90.719 kg)  BMI 34.31 kg/m2  General: Well developed, well nourished, in no acute distress. Head:  Normocephalic and atraumatic. Neck: no JVD Lungs: Clear to auscultation and percussion. Heart: Normal S1 and S2.  Holosystolic murmur at the LLSE  Pulses: Pulses normal in all 4 extremities. Extremities: No clubbing or cyanosis. No edema. Neurologic: Alert and oriented x 3.  EKG:  NSR.  Left axis deviation.  Prolonged QTc borderline.  No anterior T wave inversion.   ECHO  Study Conclusions  - Left ventricle: Small perimembranous VSD. The cavity size was normal. Wall thickness was  increased in a pattern of mild LVH. Systolic function was normal. The estimated ejection fraction was in the range of 50% to 55%. Doppler parameters are consistent with abnormal left ventricular relaxation (grade 1 diastolic dysfunction). - Aortic valve: Mild regurgitation. - Pulmonary arteries: PA peak pressure: 32mm Hg (S). Impressions:  - No significant change from echo of Summer 2012.  Prior cath data not available in EPIC.     ASSESSMENT AND PLAN:  1.  Membranous VSD.  2.  SP cryoablation of VT with syncope at [facility] medical center early 1990s.   3.  Hypertension 4.  Metabolic syndrome/moderate obesity 5.  Hypertensive heart disease.    She will need close monitoring during and after surgery due to multiple medical issues, and prior cardiac issues.

## 2012-10-06 NOTE — Patient Instructions (Signed)
Your physician wants you to follow-up in: 6 MONTHS with Dr McLean (previous pt of Dr Stuckey). You will receive a reminder letter in the mail two months in advance. If you don't receive a letter, please call our office to schedule the follow-up appointment.  Your physician recommends that you continue on your current medications as directed. Please refer to the Current Medication list given to you today.  

## 2012-10-09 NOTE — Assessment & Plan Note (Signed)
>>  ASSESSMENT AND PLAN FOR TOBACCO ABUSE WRITTEN ON 10/09/2012  7:09 AM BY Herby Abraham, MD  Continues to smoke.

## 2012-10-09 NOTE — Assessment & Plan Note (Signed)
Prior cryoablation in [place].

## 2012-10-09 NOTE — Assessment & Plan Note (Signed)
Long standing and stable 

## 2012-10-09 NOTE — Assessment & Plan Note (Signed)
Under the care of her primary MD.  He has done a great job.  Last EF was improved.

## 2012-10-09 NOTE — Assessment & Plan Note (Signed)
Continues to smoke.

## 2013-03-13 ENCOUNTER — Encounter: Payer: Medicare Other | Attending: Family Medicine | Admitting: *Deleted

## 2013-03-13 VITALS — Ht 64.0 in | Wt 197.1 lb

## 2013-03-13 DIAGNOSIS — E119 Type 2 diabetes mellitus without complications: Secondary | ICD-10-CM | POA: Insufficient documentation

## 2013-03-13 DIAGNOSIS — Z713 Dietary counseling and surveillance: Secondary | ICD-10-CM | POA: Insufficient documentation

## 2013-03-13 NOTE — Progress Notes (Signed)
Appt start time: 1500 end time:  1600.  Assessment:  Patient was seen on  03/13/2013 for individual diabetes education. She has had several back and hip surgeries and managing pain, Sleeps from 1 AM to 10 AM so her first meal is usually lunch. She states history of DM for about 5 years, she does not have a meter yet nor is she interested in getting one with her A1c below 7%.  Current HbA1c: 6.7%  MEDICATIONS: see list, diabetes medication is Metformin    DIETARY INTAKE:  Usual eating pattern includes 2 meals and 1-2 snacks per day.  Everyday foods include variety of all food groups.  Avoided foods include chips, fries, .    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 Had done chair yoga in the past too. Also does massage therapy to help handle pain.  Estimated energy needs: 1400 calories 158 g carbohydrates 105 g protein 39 g fat  Progress Towards Goal(s):  In progress.   Nutritional Diagnosis:  NI-1.5 Excessive energy intake As related to activity level.  As evidenced by BMI of 33.1.    Intervention:  Nutrition counseling provided.  Discussed diabetes disease process and treatment options.  Discussed physiology of diabetes and role of obesity on insulin resistance.  Encouraged moderate weight reduction to improve glucose levels.  Discussed role of medications and diet in glucose control  Provided education on macronutrients on glucose levels.  Provided education on carb counting, importance of regularly scheduled meals/snacks, and meal planning  Discussed  effects of physical activity on glucose levels and long-term glucose control.    Reviewed patient medications.  Discussed role of medication on blood glucose and possible side effects  Discussed rationale of blood glucose monitoring and interpretation.  Discussed recommended target ranges and individual ranges.    Described short-term complications: hyper- and hypo-glycemia.  Discussed causes,symptoms, and treatment options.  At next visit, plan to:  Discuss prevention, detection, and treatment of long-term complications.  Discussed the role of prolonged elevated glucose levels on body systems.  Discuss role of stress on blood glucose levels and discussed strategies to manage psychosocial issues.  Discuss recommendations for long-term diabetes self-care.  Established checklist for medical, dental, and emotional self-care.  Handouts given during visit include: Living Well with Diabetes Carb Counting and Food Label handouts Meal Plan Card  Barriers to learning/adherance to lifestyle change: physical limitations to adequate exercise  Diabetes self-care support plan:   [facility] support group if interested  Continued education  Monitoring/Evaluation:  Dietary intake, exercise, reading food labels, and body weight in 4 week(s).

## 2013-03-15 ENCOUNTER — Encounter: Payer: Self-pay | Admitting: *Deleted

## 2013-03-15 NOTE — Patient Instructions (Signed)
Plan:  Aim for 2-3 Carb Choices per meal (30-45 grams) +/- 1 either way  Aim for 0-1 Carbs per snack if hungry  Consider reading food labels for Total Carbohydrate and Fat Grams of foods Consider  increasing your activity level by Arm Chair Exercises for 15 minutes daily as tolerated

## 2013-04-12 ENCOUNTER — Ambulatory Visit: Payer: Medicare Other | Admitting: *Deleted

## 2013-08-22 ENCOUNTER — Encounter: Payer: Self-pay | Admitting: *Deleted

## 2013-08-23 ENCOUNTER — Ambulatory Visit (INDEPENDENT_AMBULATORY_CARE_PROVIDER_SITE_OTHER): Payer: Medicare Other | Admitting: Cardiology

## 2013-08-23 ENCOUNTER — Encounter: Payer: Self-pay | Admitting: *Deleted

## 2013-08-23 ENCOUNTER — Encounter: Payer: Self-pay | Admitting: Cardiology

## 2013-08-23 VITALS — BP 126/78 | HR 97 | Ht 64.0 in | Wt 199.0 lb

## 2013-08-23 DIAGNOSIS — I472 Ventricular tachycardia, unspecified: Secondary | ICD-10-CM

## 2013-08-23 DIAGNOSIS — I10 Essential (primary) hypertension: Secondary | ICD-10-CM

## 2013-08-23 DIAGNOSIS — I429 Cardiomyopathy, unspecified: Secondary | ICD-10-CM

## 2013-08-23 DIAGNOSIS — F17200 Nicotine dependence, unspecified, uncomplicated: Secondary | ICD-10-CM

## 2013-08-23 DIAGNOSIS — Q210 Ventricular septal defect: Secondary | ICD-10-CM

## 2013-08-23 DIAGNOSIS — R079 Chest pain, unspecified: Secondary | ICD-10-CM

## 2013-08-23 DIAGNOSIS — I4729 Other ventricular tachycardia: Secondary | ICD-10-CM

## 2013-08-23 NOTE — Patient Instructions (Signed)
Your physician has requested that you have an echocardiogram. Echocardiography is a painless test that uses sound waves to create images of your heart. It provides your doctor with information about the size and shape of your heart and how well your heart's chambers and valves are working. This procedure takes approximately one hour. There are no restrictions for this procedure.  Your physician has requested that you have a lexiscan myoview. For further information please visit [email or web address] Please follow instruction sheet, as given.  Your physician wants you to follow-up in: 1 year with Dr Shirlee Latch. (February 2016).  You will receive a reminder letter in the mail two months in advance. If you don't receive a letter, please call our office to schedule the follow-up appointment.

## 2013-08-24 NOTE — Progress Notes (Signed)
Patient ID: [name], female   DOB: 01-30-1956, 58 y.o.   MRN: [id number] PCP: Dr. Nicholos Johnseade  58 yo with history of membranous VSD (small), cardiomyopathy (probably due to HTN, EF improved on most recent echo), and prior VT s/p ablation presents for cardiology followup.  She has seen Dr. Riley KillStuckey in the past and sees me for the first time today.  She states that about a week ago, she had 15-20 minutes of central chest pressure and nausea while she was in the bed reading.   She had been under stress as her mother-in-law had just passed away.  About a month prior to this, she had a similar severe chest pain episode at rest.   Chest pain was not related to exertion or meals.  Otherwise, doing well.  No exertional dyspnea.  No tachypalpitations.    ECG: NSR, LAFB, QTc 492 msec  Labs (8/13): K 4.3, creatinine 0.79  PMH: 1. GERD 2. HTN 3. Membranous VSD: Small.  4. Scoliosis 5. VT: s/p VT ablation in the early 1990s in [place].  6. Cardiomyopathy: Possibly related to HTN.  This improved with control of BP.  Last echo in 3/13 showed EF 50-55%, small perimembranous VSD, mild LVH, mild AI, PA systolic pressure 32 mmHg.  7. Type II diabetes 8. Atypical chest pain  9. Bilateral THR  SH: Smokes 1-2 cigs/day, married, prior ETOH abuse (has quit).  Retired Runner, broadcasting/film/videoteacher, now Tree surgeonartist.   FH: Father with CVA at [phone]. Uncles with MIs  ROS: All systems reviewed and negative except as per HPI.    Current Outpatient Prescriptions  Medication Sig Dispense Refill  . aspirin 81 MG tablet Take 81 mg by mouth daily.        [name]. buPROPion (WELLBUTRIN XL) 300 MG 24 hr tablet Take 300 mg by mouth daily.        . celecoxib (CELEBREX) 200 MG capsule Take 200 mg by mouth 2 (two) times daily.      . fluvoxaMINE (LUVOX) 100 MG tablet 100 mg. Take 3 capsules by mouth at bed time       . gabapentin (NEURONTIN) 300 MG capsule 300 mg. Take 2 cap. At bedtime       . levothyroxine (SYNTHROID, LEVOTHROID) 50 MCG tablet Take 50  mcg by mouth daily before breakfast.      . losartan-hydrochlorothiazide (HYZAAR) 100-12.5 MG per tablet Take 1 tablet by mouth daily.  30 tablet  9  . metFORMIN (GLUCOPHAGE) 1000 MG tablet Take 1,000 mg by mouth daily.        . Multiple Vitamin (MULTIVITAMIN) tablet Take 1 tablet by mouth daily.        [name]. omeprazole (PRILOSEC OTC) 20 MG tablet Take 20 mg by mouth daily.        [name]. oxyCODONE-acetaminophen (PERCOCET) 5-325 MG per tablet Take 1 tablet by mouth every 4 (four) hours as needed for pain.      [name]. QUEtiapine (SEROQUEL) 300 MG tablet Take 600 mg by mouth at bedtime.        . traZODone (DESYREL) 100 MG tablet Take 100 mg by mouth at bedtime.        . meloxicam (MOBIC) 15 MG tablet Take 15 mg by mouth as needed.        No current facility-administered medications for this visit.    BP 126/78  Pulse 97  Ht 5\' 4"  (1.626 m)  Wt 90.266 kg (199 lb)  BMI 34.14 kg/m2 General: NAD Neck: No JVD, no  thyromegaly or thyroid nodule.  Lungs: Clear to auscultation bilaterally with normal respiratory effort. CV: Nondisplaced PMI.  Heart regular S1/S2, no S3/S4, 2/6 SEM along the sternal border.  No peripheral edema.  No carotid bruit.  Normal pedal pulses.  Abdomen: Soft, nontender, no hepatosplenomegaly, no distention.  Skin: Intact without lesions or rashes.  Neurologic: Alert and oriented x 3.  Psych: Normal affect. Extremities: No clubbing or cyanosis.   1. Chest pain: She had 2 severe episodes recently, both at rest.  She had been under stress both times the pain occurred. Cardiac risk factors: Type II diabetes, HTN, smoking.  I will arrange for a Lexiscan Cardiolite to risk stratify. Continue ASA 81 mg daily.  2. Smoking: I strongly encouraged her to quit smoking.   3. Given diabetes, I would strongly consider statin treatment.  I will check lipids with labs today to decide on which statin.  4. Perimembranous VSD: Murmur noted.  This is small and has been known for a long time.  5.  Cardiomyopathy: Possibly related to HTN.  EF had recovered on last echo.  BP is controlled.  I am going to get a repeat echo to reassess LV systolic function.   6. HTN: BP controlled on current regimen.  7. H/o VT: s/p ablation.  No symptomatic recurrence.   Marca Ancona 08/24/2013

## 2013-09-05 ENCOUNTER — Ambulatory Visit (HOSPITAL_COMMUNITY): Payer: Medicare Other | Attending: Cardiology | Admitting: Radiology

## 2013-09-05 VITALS — BP 157/108 | HR 75 | Ht 64.0 in | Wt 196.0 lb

## 2013-09-05 DIAGNOSIS — F17200 Nicotine dependence, unspecified, uncomplicated: Secondary | ICD-10-CM | POA: Insufficient documentation

## 2013-09-05 DIAGNOSIS — I4729 Other ventricular tachycardia: Secondary | ICD-10-CM | POA: Insufficient documentation

## 2013-09-05 DIAGNOSIS — Q210 Ventricular septal defect: Secondary | ICD-10-CM

## 2013-09-05 DIAGNOSIS — R079 Chest pain, unspecified: Secondary | ICD-10-CM | POA: Insufficient documentation

## 2013-09-05 DIAGNOSIS — I472 Ventricular tachycardia, unspecified: Secondary | ICD-10-CM | POA: Insufficient documentation

## 2013-09-05 DIAGNOSIS — I428 Other cardiomyopathies: Secondary | ICD-10-CM | POA: Insufficient documentation

## 2013-09-05 DIAGNOSIS — R110 Nausea: Secondary | ICD-10-CM | POA: Insufficient documentation

## 2013-09-05 DIAGNOSIS — E119 Type 2 diabetes mellitus without complications: Secondary | ICD-10-CM | POA: Insufficient documentation

## 2013-09-05 DIAGNOSIS — I10 Essential (primary) hypertension: Secondary | ICD-10-CM | POA: Insufficient documentation

## 2013-09-05 MED ORDER — TECHNETIUM TC 99M SESTAMIBI GENERIC - CARDIOLITE
30.0000 | Freq: Once | INTRAVENOUS | Status: AC | PRN
  Administered 2013-09-05: 30 via INTRAVENOUS

## 2013-09-05 MED ORDER — TECHNETIUM TC 99M SESTAMIBI GENERIC - CARDIOLITE
10.0000 | Freq: Once | INTRAVENOUS | Status: AC | PRN
  Administered 2013-09-05: 10 via INTRAVENOUS

## 2013-09-05 MED ORDER — REGADENOSON 0.4 MG/5ML IV SOLN
0.4000 mg | Freq: Once | INTRAVENOUS | Status: AC
  Administered 2013-09-05: 0.4 mg via INTRAVENOUS

## 2013-09-05 NOTE — Progress Notes (Addendum)
[facility] SITE 3 NUCLEAR MED [address] [place], [place] [address] [phone]    Cardiology Nuclear Med Study  [name] is a 58 y.o. female     MRN : [id number]     DOB: 1955-09-23  Procedure Date: 09/05/2013  Nuclear Med Background Indication for Stress Test:  Evaluation for Ischemia History:  No known CAD, VT - ablation, Echo 2013 EF 50-55%, MPI ~10 yrs. (normal), cardiomyopathy Cardiac Risk Factors: Hypertension, NIDDM and Smoker  Symptoms:  Chest Pain and Nausea   Nuclear Pre-Procedure Caffeine/Decaff Intake:  None NPO After: 7:00pm   Lungs:  clear O2 Sat: 98% on room air. IV 0.9% NS with Angio Cath:  22g  IV Site: R Hand  IV Started by:  Cathlyn Parsons, RN  Chest Size (in):  42 Cup Size: DD  Height: 5\' 4"  (1.626 m)  Weight:  196 lb (88.905 kg)  BMI:  Body mass index is 33.63 kg/(m^2). Tech Comments:  n/a    Nuclear Med Study 1 or 2 day study: 1 day  Stress Test Type:  Treadmill/Lexiscan  Reading MD: n/a  Order Authorizing Provider:  Fransico Meadow  Resting Radionuclide: Technetium 57m Sestamibi  Resting Radionuclide Dose: 11.0 mCi   Stress Radionuclide:  Technetium 31m Sestamibi  Stress Radionuclide Dose: 33.0 mCi           Stress Protocol Rest HR: 75 Stress HR: 118  Rest BP: 157/108 Stress BP: 137/109  Exercise Time (min): n/a METS: n/a           Dose of Adenosine (mg):  n/a Dose of Lexiscan: 0.4 mg  Dose of Atropine (mg): n/a Dose of Dobutamine: n/a mcg/kg/min (at max HR)  Stress Test Technologist: Nelson Chimes, BS-ES  Nuclear Technologist:  Domenic Polite, CNMT     Rest Procedure:  Myocardial perfusion imaging was performed at rest 45 minutes following the intravenous administration of Technetium 34m Sestamibi. Rest ECG: NSR, NSSTW changes.   Stress Procedure:  The patient received IV Lexiscan 0.4 mg over 15-seconds with concurrent low level exercise and then Technetium 68m Sestamibi was injected at 30-seconds while  the patient continued walking one more minute.  Quantitative spect images were obtained after a 45-minute delay.  During the infusion of Lexiscan, the patient complained of SOB.  This resolved in recovery.  Stress ECG: No significant change from baseline ECG  QPS Raw Data Images:  Normal; no motion artifact; normal heart/lung ratio. Stress Images:  There is decreased uptake in the apex. Rest Images:  There is decreased uptake in the apex. Subtraction (SDS):  No evidence of ischemia. Transient Ischemic Dilatation (Normal <1.22):  0.81 Lung/Heart Ratio (Normal <0.45):  0.34  Quantitative Gated Spect Images QGS EDV:  101 ml QGS ESV:  39 ml  Impression Exercise Capacity:  Lexiscan with low level exercise. BP Response:  Normal blood pressure response. Clinical Symptoms:  Dyspnea, typical with Lexiscan.  ECG Impression:  No significant ST segment change suggestive of ischemia. Comparison with Prior Nuclear Study: No images to compare  Overall Impression:  Low risk stress nuclear study with no ischemia..  LV Ejection Fraction: 61%.  LV Wall Motion:  NL LV Function; NL Wall Motion  SKAINS, MARK, MD  Probably normal study, no ischemia.   Marca Ancona 09/06/2013

## 2013-09-06 ENCOUNTER — Telehealth: Payer: Self-pay | Admitting: *Deleted

## 2013-09-06 NOTE — Progress Notes (Signed)
lmovm Debbie Koya Hunger RN  

## 2013-09-06 NOTE — Telephone Encounter (Signed)
lmovm normal stress test Mylo Red RN

## 2013-09-10 NOTE — Progress Notes (Signed)
LMTCB

## 2013-09-11 NOTE — Progress Notes (Signed)
LMTCB

## 2013-09-14 ENCOUNTER — Encounter: Payer: Self-pay | Admitting: Cardiovascular Disease

## 2013-09-14 ENCOUNTER — Other Ambulatory Visit: Payer: Self-pay

## 2013-09-14 ENCOUNTER — Ambulatory Visit (HOSPITAL_COMMUNITY): Payer: Medicare Other | Attending: Cardiovascular Disease | Admitting: Radiology

## 2013-09-14 DIAGNOSIS — E669 Obesity, unspecified: Secondary | ICD-10-CM | POA: Insufficient documentation

## 2013-09-14 DIAGNOSIS — I428 Other cardiomyopathies: Secondary | ICD-10-CM | POA: Insufficient documentation

## 2013-09-14 DIAGNOSIS — R0602 Shortness of breath: Secondary | ICD-10-CM

## 2013-09-14 DIAGNOSIS — I10 Essential (primary) hypertension: Secondary | ICD-10-CM | POA: Insufficient documentation

## 2013-09-14 DIAGNOSIS — Q210 Ventricular septal defect: Secondary | ICD-10-CM

## 2013-09-14 DIAGNOSIS — I509 Heart failure, unspecified: Secondary | ICD-10-CM | POA: Insufficient documentation

## 2013-09-14 DIAGNOSIS — I4729 Other ventricular tachycardia: Secondary | ICD-10-CM

## 2013-09-14 DIAGNOSIS — E119 Type 2 diabetes mellitus without complications: Secondary | ICD-10-CM | POA: Insufficient documentation

## 2013-09-14 DIAGNOSIS — I359 Nonrheumatic aortic valve disorder, unspecified: Secondary | ICD-10-CM | POA: Insufficient documentation

## 2013-09-14 DIAGNOSIS — I079 Rheumatic tricuspid valve disease, unspecified: Secondary | ICD-10-CM | POA: Insufficient documentation

## 2013-09-14 DIAGNOSIS — R079 Chest pain, unspecified: Secondary | ICD-10-CM

## 2013-09-14 DIAGNOSIS — I472 Ventricular tachycardia: Secondary | ICD-10-CM

## 2013-09-14 DIAGNOSIS — R072 Precordial pain: Secondary | ICD-10-CM

## 2013-09-14 DIAGNOSIS — F17200 Nicotine dependence, unspecified, uncomplicated: Secondary | ICD-10-CM | POA: Insufficient documentation

## 2013-09-17 NOTE — Progress Notes (Signed)
Echocardiogram performed.  

## 2013-09-18 ENCOUNTER — Telehealth: Payer: Self-pay | Admitting: Cardiology

## 2013-09-18 NOTE — Telephone Encounter (Signed)
New message ° ° ° ° °Want echo and stress test results °

## 2013-09-18 NOTE — Progress Notes (Signed)
Lm on mobile phone to call back

## 2013-09-18 NOTE — Telephone Encounter (Signed)
Stress was normal but echo showed that the heart was mildly weakened.  Think this had been noted in the past.  I would grade EF 45-50%.  Stable small VSD, RV looks ok.  I would suggest she start a low dose of Coreg, 3.125 mg bid, to help her heart strengthen.

## 2013-09-18 NOTE — Telephone Encounter (Signed)
Patient aware that Dr.McLean needs to look at her echo results to compare them with the nuclear stress test and then she will receive a call back.

## 2013-09-19 MED ORDER — CARVEDILOL 3.125 MG PO TABS: 3.1250 mg | ORAL_TABLET | Freq: Two times a day (BID) | ORAL | Status: DC

## 2013-09-19 MED ORDER — CARVEDILOL 3.125 MG PO TABS
3.1250 mg | ORAL_TABLET | Freq: Two times a day (BID) | ORAL | Status: DC
Start: 2013-09-19 — End: 2013-09-19

## 2013-09-19 NOTE — Progress Notes (Signed)
LMTCB

## 2013-09-19 NOTE — Progress Notes (Signed)
Discussed with patient

## 2013-09-19 NOTE — Telephone Encounter (Signed)
Pt advised, verbalized understanding, agreed to start coreg 3.125mg  two times a day.

## 2013-09-19 NOTE — Telephone Encounter (Signed)
LMTCB

## 2013-09-25 ENCOUNTER — Other Ambulatory Visit: Payer: Self-pay

## 2013-09-25 DIAGNOSIS — Z1231 Encounter for screening mammogram for malignant neoplasm of breast: Secondary | ICD-10-CM

## 2013-10-18 ENCOUNTER — Ambulatory Visit: Payer: Medicare Other

## 2013-10-25 ENCOUNTER — Ambulatory Visit
Admission: RE | Admit: 2013-10-25 | Discharge: 2013-10-25 | Disposition: A | Discharge status: Home / Self Care | Payer: Medicare Other | Source: Ambulatory Visit

## 2013-10-25 DIAGNOSIS — Z1231 Encounter for screening mammogram for malignant neoplasm of breast: Secondary | ICD-10-CM

## 2013-10-29 ENCOUNTER — Other Ambulatory Visit: Payer: Self-pay | Admitting: Orthopedic Surgery

## 2013-10-29 DIAGNOSIS — M1990 Unspecified osteoarthritis, unspecified site: Secondary | ICD-10-CM

## 2013-10-29 DIAGNOSIS — M75100 Unspecified rotator cuff tear or rupture of unspecified shoulder, not specified as traumatic: Secondary | ICD-10-CM

## 2013-11-12 ENCOUNTER — Other Ambulatory Visit: Payer: Medicare Other

## 2013-11-20 ENCOUNTER — Ambulatory Visit
Admission: RE | Admit: 2013-11-20 | Discharge: 2013-11-20 | Disposition: A | Discharge status: Home / Self Care | Payer: Medicare Other | Source: Ambulatory Visit | Attending: Orthopedic Surgery | Admitting: Orthopedic Surgery

## 2013-11-20 DIAGNOSIS — M1990 Unspecified osteoarthritis, unspecified site: Secondary | ICD-10-CM

## 2013-11-20 DIAGNOSIS — M75100 Unspecified rotator cuff tear or rupture of unspecified shoulder, not specified as traumatic: Secondary | ICD-10-CM

## 2013-12-18 ENCOUNTER — Other Ambulatory Visit: Payer: Self-pay | Admitting: Gastroenterology

## 2013-12-18 DIAGNOSIS — R1310 Dysphagia, unspecified: Secondary | ICD-10-CM

## 2013-12-24 ENCOUNTER — Other Ambulatory Visit: Payer: Medicare Other

## 2013-12-26 ENCOUNTER — Other Ambulatory Visit: Payer: Medicare Other

## 2014-05-10 DIAGNOSIS — G8929 Other chronic pain: Secondary | ICD-10-CM | POA: Insufficient documentation

## 2014-05-10 DIAGNOSIS — Z9889 Other specified postprocedural states: Secondary | ICD-10-CM | POA: Insufficient documentation

## 2014-05-10 HISTORY — PX: TOTAL HIP ARTHROPLASTY: SHX124

## 2014-05-13 DIAGNOSIS — R339 Retention of urine, unspecified: Secondary | ICD-10-CM | POA: Insufficient documentation

## 2014-06-02 ENCOUNTER — Emergency Department (HOSPITAL_COMMUNITY): Payer: Medicare Other

## 2014-06-02 ENCOUNTER — Emergency Department (HOSPITAL_COMMUNITY): Payer: Medicare Other | Admitting: Anesthesiology

## 2014-06-02 ENCOUNTER — Observation Stay (HOSPITAL_COMMUNITY)
Admission: EM | Admit: 2014-06-02 | Discharge: 2014-06-03 | Payer: Medicare Other | Attending: Orthopaedic Surgery | Admitting: Orthopaedic Surgery

## 2014-06-02 ENCOUNTER — Encounter (HOSPITAL_COMMUNITY)
Admission: EM | Disposition: A | Discharge status: Other Acute Inpatient Hospital | Payer: Self-pay | Source: Home / Self Care | Attending: Emergency Medicine

## 2014-06-02 ENCOUNTER — Encounter (HOSPITAL_COMMUNITY): Payer: Self-pay | Admitting: Physical Medicine and Rehabilitation

## 2014-06-02 DIAGNOSIS — Y9389 Activity, other specified: Secondary | ICD-10-CM | POA: Insufficient documentation

## 2014-06-02 DIAGNOSIS — W010XXA Fall on same level from slipping, tripping and stumbling without subsequent striking against object, initial encounter: Secondary | ICD-10-CM | POA: Insufficient documentation

## 2014-06-02 DIAGNOSIS — Z96649 Presence of unspecified artificial hip joint: Secondary | ICD-10-CM

## 2014-06-02 DIAGNOSIS — Z7982 Long term (current) use of aspirin: Secondary | ICD-10-CM | POA: Insufficient documentation

## 2014-06-02 DIAGNOSIS — I10 Essential (primary) hypertension: Secondary | ICD-10-CM | POA: Insufficient documentation

## 2014-06-02 DIAGNOSIS — E119 Type 2 diabetes mellitus without complications: Secondary | ICD-10-CM | POA: Diagnosis not present

## 2014-06-02 DIAGNOSIS — Y92012 Bathroom of single-family (private) house as the place of occurrence of the external cause: Secondary | ICD-10-CM | POA: Diagnosis not present

## 2014-06-02 DIAGNOSIS — F329 Major depressive disorder, single episode, unspecified: Secondary | ICD-10-CM | POA: Diagnosis not present

## 2014-06-02 DIAGNOSIS — Z6833 Body mass index (BMI) 33.0-33.9, adult: Secondary | ICD-10-CM | POA: Diagnosis not present

## 2014-06-02 DIAGNOSIS — T84010A Broken internal right hip prosthesis, initial encounter: Secondary | ICD-10-CM | POA: Diagnosis present

## 2014-06-02 DIAGNOSIS — I472 Ventricular tachycardia: Secondary | ICD-10-CM | POA: Diagnosis not present

## 2014-06-02 DIAGNOSIS — E039 Hypothyroidism, unspecified: Secondary | ICD-10-CM | POA: Insufficient documentation

## 2014-06-02 DIAGNOSIS — Y998 Other external cause status: Secondary | ICD-10-CM | POA: Diagnosis not present

## 2014-06-02 DIAGNOSIS — N390 Urinary tract infection, site not specified: Secondary | ICD-10-CM | POA: Insufficient documentation

## 2014-06-02 DIAGNOSIS — K219 Gastro-esophageal reflux disease without esophagitis: Secondary | ICD-10-CM | POA: Diagnosis not present

## 2014-06-02 DIAGNOSIS — M810 Age-related osteoporosis without current pathological fracture: Secondary | ICD-10-CM | POA: Diagnosis not present

## 2014-06-02 DIAGNOSIS — M978XXA Periprosthetic fracture around other internal prosthetic joint, initial encounter: Secondary | ICD-10-CM

## 2014-06-02 DIAGNOSIS — Q210 Ventricular septal defect: Secondary | ICD-10-CM | POA: Insufficient documentation

## 2014-06-02 DIAGNOSIS — S72001A Fracture of unspecified part of neck of right femur, initial encounter for closed fracture: Secondary | ICD-10-CM

## 2014-06-02 DIAGNOSIS — Y793 Surgical instruments, materials and orthopedic devices (including sutures) associated with adverse incidents: Secondary | ICD-10-CM | POA: Diagnosis not present

## 2014-06-02 DIAGNOSIS — F17210 Nicotine dependence, cigarettes, uncomplicated: Secondary | ICD-10-CM | POA: Diagnosis not present

## 2014-06-02 DIAGNOSIS — W19XXXA Unspecified fall, initial encounter: Secondary | ICD-10-CM

## 2014-06-02 HISTORY — PX: EXAM UNDER ANESTHESIA WITH MANIPULATION OF HIP: SHX5815

## 2014-06-02 HISTORY — PX: HIP CLOSED REDUCTION: SHX983

## 2014-06-02 LAB — URINALYSIS, ROUTINE W REFLEX MICROSCOPIC
BILIRUBIN URINE: NEGATIVE
Glucose, UA: NEGATIVE mg/dL
HGB URINE DIPSTICK: NEGATIVE
Ketones, ur: NEGATIVE mg/dL
Nitrite: POSITIVE — AB
PROTEIN: NEGATIVE mg/dL
Specific Gravity, Urine: 1.008 (ref 1.005–1.030)
UROBILINOGEN UA: 0.2 mg/dL (ref 0.0–1.0)
pH: 6.5 (ref 5.0–8.0)

## 2014-06-02 LAB — PROTIME-INR
INR: 1.07 (ref 0.00–1.49)
Prothrombin Time: 14.1 seconds (ref 11.6–15.2)

## 2014-06-02 LAB — URINE MICROSCOPIC-ADD ON

## 2014-06-02 LAB — BASIC METABOLIC PANEL
Anion gap: 16 — ABNORMAL HIGH (ref 5–15)
BUN: 8 mg/dL (ref 6–23)
CO2: 21 meq/L (ref 19–32)
CREATININE: 0.56 mg/dL (ref 0.50–1.10)
Calcium: 9 mg/dL (ref 8.4–10.5)
Chloride: 92 mEq/L — ABNORMAL LOW (ref 96–112)
GFR calc Af Amer: >90 mL/min (ref >90)
Glucose, Bld: 106 mg/dL — ABNORMAL HIGH (ref 70–99)
Potassium: 4.3 mEq/L (ref 3.7–5.3)
Sodium: 129 mEq/L — ABNORMAL LOW (ref 137–147)

## 2014-06-02 LAB — CBC WITH DIFFERENTIAL/PLATELET
BASOS ABS: 0.1 10*3/uL (ref 0.0–0.1)
Basophils Relative: 1 % (ref 0–1)
EOS ABS: 1.3 10*3/uL — AB (ref 0.0–0.7)
Eosinophils Relative: 12 % — ABNORMAL HIGH (ref 0–5)
HEMATOCRIT: 29.2 % — AB (ref 36.0–46.0)
HEMOGLOBIN: 9.6 g/dL — AB (ref 12.0–15.0)
Lymphocytes Relative: 21 % (ref 12–46)
Lymphs Abs: 2.3 10*3/uL (ref 0.7–4.0)
MCH: 31.1 pg (ref 26.0–34.0)
MCHC: 32.9 g/dL (ref 30.0–36.0)
MCV: 94.5 fL (ref 78.0–100.0)
MONO ABS: 0.9 10*3/uL (ref 0.1–1.0)
MONOS PCT: 9 % (ref 3–12)
NEUTROS PCT: 59 % (ref 43–77)
Neutro Abs: 6.5 10*3/uL (ref 1.7–7.7)
Platelets: 386 10*3/uL (ref 150–400)
RBC: 3.09 MIL/uL — ABNORMAL LOW (ref 3.87–5.11)
RDW: 15.1 % (ref 11.5–15.5)
WBC: 11 10*3/uL — ABNORMAL HIGH (ref 4.0–10.5)

## 2014-06-02 LAB — GLUCOSE, CAPILLARY: Glucose-Capillary: 92 mg/dL (ref 70–99)

## 2014-06-02 SURGERY — CLOSED MANIPULATION, JOINT, HIP
Anesthesia: General | Site: Hip | Laterality: Right

## 2014-06-02 MED ORDER — HYDROMORPHONE HCL 1 MG/ML IJ SOLN
INTRAMUSCULAR | Status: AC
  Filled 2014-06-02: qty 1

## 2014-06-02 MED ORDER — PANTOPRAZOLE SODIUM 40 MG PO TBEC
40.0000 mg | DELAYED_RELEASE_TABLET | Freq: Every day | ORAL | Status: DC
  Administered 2014-06-03: 40 mg via ORAL
  Filled 2014-06-02: qty 1

## 2014-06-02 MED ORDER — DOCUSATE SODIUM 100 MG PO CAPS
100.0000 mg | ORAL_CAPSULE | Freq: Two times a day (BID) | ORAL | Status: DC
  Administered 2014-06-03: 100 mg via ORAL
  Filled 2014-06-02 (×3): qty 1

## 2014-06-02 MED ORDER — FENTANYL CITRATE 0.05 MG/ML IJ SOLN
50.0000 ug | Freq: Once | INTRAMUSCULAR | Status: AC
  Administered 2014-06-02: 50 ug via INTRAVENOUS
  Filled 2014-06-02: qty 2

## 2014-06-02 MED ORDER — LOSARTAN POTASSIUM 50 MG PO TABS
100.0000 mg | ORAL_TABLET | Freq: Every day | ORAL | Status: DC
  Administered 2014-06-03: 100 mg via ORAL
  Filled 2014-06-02: qty 2

## 2014-06-02 MED ORDER — PROPOFOL 10 MG/ML IV BOLUS
INTRAVENOUS | Status: AC
  Filled 2014-06-02: qty 20

## 2014-06-02 MED ORDER — PROPOFOL 10 MG/ML IV BOLUS
INTRAVENOUS | Status: DC | PRN
  Administered 2014-06-02: 100 mg via INTRAVENOUS
  Administered 2014-06-02 (×2): 50 mg via INTRAVENOUS

## 2014-06-02 MED ORDER — FENTANYL CITRATE 0.05 MG/ML IJ SOLN
100.0000 ug | Freq: Once | INTRAMUSCULAR | Status: AC
  Administered 2014-06-02: 100 ug via INTRAVENOUS
  Filled 2014-06-02: qty 2

## 2014-06-02 MED ORDER — METHOCARBAMOL 500 MG PO TABS
500.0000 mg | ORAL_TABLET | Freq: Four times a day (QID) | ORAL | Status: DC | PRN
  Administered 2014-06-02: 500 mg via ORAL
  Filled 2014-06-02: qty 1

## 2014-06-02 MED ORDER — FLUVOXAMINE MALEATE 100 MG PO TABS
300.0000 mg | ORAL_TABLET | Freq: Every day | ORAL | Status: DC
  Administered 2014-06-03: 300 mg via ORAL
  Filled 2014-06-02 (×2): qty 3

## 2014-06-02 MED ORDER — ADULT MULTIVITAMIN W/MINERALS CH
1.0000 | ORAL_TABLET | Freq: Every day | ORAL | Status: DC
  Administered 2014-06-03: 1 via ORAL
  Filled 2014-06-02: qty 1

## 2014-06-02 MED ORDER — SODIUM CHLORIDE 0.9 % IV BOLUS (SEPSIS)
1000.0000 mL | Freq: Once | INTRAVENOUS | Status: AC
  Administered 2014-06-02: 1000 mL via INTRAVENOUS

## 2014-06-02 MED ORDER — OXYCODONE HCL 5 MG/5ML PO SOLN: 5.0000 mg | Freq: Once | ORAL | Status: AC | PRN

## 2014-06-02 MED ORDER — BUPROPION HCL ER (XL) 300 MG PO TB24
300.0000 mg | ORAL_TABLET | Freq: Every day | ORAL | Status: DC
  Administered 2014-06-03: 300 mg via ORAL
  Filled 2014-06-02: qty 1

## 2014-06-02 MED ORDER — CIPROFLOXACIN HCL 500 MG PO TABS
500.0000 mg | ORAL_TABLET | Freq: Two times a day (BID) | ORAL | Status: DC
  Administered 2014-06-03 (×2): 500 mg via ORAL
  Filled 2014-06-02 (×4): qty 1

## 2014-06-02 MED ORDER — OXYCODONE HCL 5 MG PO TABS
ORAL_TABLET | ORAL | Status: AC
  Administered 2014-06-02: 5 mg via ORAL
  Filled 2014-06-02: qty 1

## 2014-06-02 MED ORDER — HYDROMORPHONE HCL 1 MG/ML IJ SOLN
0.2500 mg | INTRAMUSCULAR | Status: DC | PRN
  Administered 2014-06-02 (×4): 0.5 mg via INTRAVENOUS

## 2014-06-02 MED ORDER — HYDROMORPHONE HCL 1 MG/ML IJ SOLN
0.5000 mg | INTRAMUSCULAR | Status: DC | PRN
  Administered 2014-06-02 – 2014-06-03 (×2): 1 mg via INTRAVENOUS
  Filled 2014-06-02 (×2): qty 1

## 2014-06-02 MED ORDER — QUETIAPINE FUMARATE 300 MG PO TABS
600.0000 mg | ORAL_TABLET | Freq: Every day | ORAL | Status: DC
  Administered 2014-06-03: 600 mg via ORAL
  Filled 2014-06-02 (×2): qty 2

## 2014-06-02 MED ORDER — ASPIRIN 81 MG PO CHEW
81.0000 mg | CHEWABLE_TABLET | Freq: Every day | ORAL | Status: DC
  Administered 2014-06-03: 81 mg via ORAL
  Filled 2014-06-02 (×2): qty 1

## 2014-06-02 MED ORDER — OXYCODONE HCL 5 MG PO TABS
5.0000 mg | ORAL_TABLET | Freq: Once | ORAL | Status: AC | PRN
  Administered 2014-06-02: 5 mg via ORAL

## 2014-06-02 MED ORDER — METOCLOPRAMIDE HCL 10 MG PO TABS
5.0000 mg | ORAL_TABLET | Freq: Three times a day (TID) | ORAL | Status: DC | PRN
  Administered 2014-06-03: 10 mg via ORAL
  Filled 2014-06-02: qty 1

## 2014-06-02 MED ORDER — OXYCODONE-ACETAMINOPHEN 5-325 MG PO TABS
1.0000 | ORAL_TABLET | ORAL | Status: DC | PRN
Start: 2014-06-02 — End: 2014-06-03
  Administered 2014-06-02 – 2014-06-03 (×3): 2 via ORAL
  Filled 2014-06-02 (×3): qty 2

## 2014-06-02 MED ORDER — METHOCARBAMOL 500 MG PO TABS
ORAL_TABLET | ORAL | Status: AC
  Filled 2014-06-02: qty 1

## 2014-06-02 MED ORDER — TRAZODONE HCL 100 MG PO TABS
100.0000 mg | ORAL_TABLET | Freq: Every day | ORAL | Status: DC
  Administered 2014-06-03: 100 mg via ORAL
  Filled 2014-06-02 (×2): qty 1

## 2014-06-02 MED ORDER — INSULIN ASPART 100 UNIT/ML ~~LOC~~ SOLN: 0.0000 [IU] | Freq: Three times a day (TID) | SUBCUTANEOUS | Status: DC

## 2014-06-02 MED ORDER — LIDOCAINE HCL (CARDIAC) 20 MG/ML IV SOLN
INTRAVENOUS | Status: DC | PRN
  Administered 2014-06-02: 100 mg via INTRAVENOUS

## 2014-06-02 MED ORDER — METFORMIN HCL 500 MG PO TABS
1000.0000 mg | ORAL_TABLET | Freq: Every day | ORAL | Status: DC
  Administered 2014-06-03: 1000 mg via ORAL
  Filled 2014-06-02 (×2): qty 2

## 2014-06-02 MED ORDER — METHOCARBAMOL 1000 MG/10ML IJ SOLN
500.0000 mg | Freq: Four times a day (QID) | INTRAMUSCULAR | Status: DC | PRN
  Filled 2014-06-02: qty 5

## 2014-06-02 MED ORDER — METOCLOPRAMIDE HCL 5 MG/ML IJ SOLN: 5.0000 mg | Freq: Three times a day (TID) | INTRAMUSCULAR | Status: DC | PRN

## 2014-06-02 MED ORDER — LACTATED RINGERS IV SOLN
INTRAVENOUS | Status: DC | PRN
  Administered 2014-06-02: 20:00:00 via INTRAVENOUS

## 2014-06-02 MED ORDER — HYDROMORPHONE HCL 1 MG/ML IJ SOLN: 1.0000 mg | Freq: Once | INTRAMUSCULAR | Status: DC

## 2014-06-02 MED ORDER — GABAPENTIN 300 MG PO CAPS
600.0000 mg | ORAL_CAPSULE | Freq: Every day | ORAL | Status: DC
  Administered 2014-06-03: 600 mg via ORAL
  Filled 2014-06-02 (×2): qty 2

## 2014-06-02 MED ORDER — ASPIRIN 81 MG PO TABS: 81.0000 mg | ORAL_TABLET | Freq: Every day | ORAL | Status: DC

## 2014-06-02 MED ORDER — OMEPRAZOLE MAGNESIUM 20 MG PO TBEC: 20.0000 mg | DELAYED_RELEASE_TABLET | Freq: Every day | ORAL | Status: DC

## 2014-06-02 MED ORDER — LEVOTHYROXINE SODIUM 50 MCG PO TABS
50.0000 ug | ORAL_TABLET | Freq: Every day | ORAL | Status: DC
  Administered 2014-06-03: 50 ug via ORAL
  Filled 2014-06-02 (×2): qty 1

## 2014-06-02 MED ORDER — ONDANSETRON HCL 4 MG/2ML IJ SOLN: 4.0000 mg | Freq: Four times a day (QID) | INTRAMUSCULAR | Status: DC | PRN

## 2014-06-02 MED ORDER — OXYCODONE-ACETAMINOPHEN 5-325 MG PO TABS
1.0000 | ORAL_TABLET | Freq: Once | ORAL | Status: AC
  Administered 2014-06-02: 1 via ORAL
  Filled 2014-06-02: qty 1

## 2014-06-02 MED ORDER — DIPHENHYDRAMINE HCL 12.5 MG/5ML PO ELIX: 12.5000 mg | ORAL_SOLUTION | ORAL | Status: DC | PRN

## 2014-06-02 MED ORDER — SUCCINYLCHOLINE CHLORIDE 20 MG/ML IJ SOLN
INTRAMUSCULAR | Status: DC | PRN
  Administered 2014-06-02: 60 mg via INTRAVENOUS

## 2014-06-02 MED ORDER — HYDROMORPHONE HCL 1 MG/ML IJ SOLN
1.0000 mg | Freq: Once | INTRAMUSCULAR | Status: AC
  Administered 2014-06-02: 1 mg via INTRAVENOUS
  Filled 2014-06-02: qty 1

## 2014-06-02 MED ORDER — DEXTROSE 5 % IV SOLN
1.0000 g | Freq: Once | INTRAVENOUS | Status: AC
  Administered 2014-06-02: 1 g via INTRAVENOUS
  Filled 2014-06-02: qty 10

## 2014-06-02 MED ORDER — HYDROCHLOROTHIAZIDE 12.5 MG PO CAPS
12.5000 mg | ORAL_CAPSULE | Freq: Every day | ORAL | Status: DC
  Administered 2014-06-03: 12.5 mg via ORAL
  Filled 2014-06-02: qty 1

## 2014-06-02 MED ORDER — ONDANSETRON HCL 4 MG PO TABS: 4.0000 mg | ORAL_TABLET | Freq: Four times a day (QID) | ORAL | Status: DC | PRN

## 2014-06-02 MED ORDER — ONDANSETRON HCL 4 MG/2ML IJ SOLN
4.0000 mg | Freq: Once | INTRAMUSCULAR | Status: AC
  Administered 2014-06-02: 4 mg via INTRAVENOUS
  Filled 2014-06-02: qty 2

## 2014-06-02 MED ORDER — LOSARTAN POTASSIUM-HCTZ 100-12.5 MG PO TABS: 1.0000 | ORAL_TABLET | Freq: Every day | ORAL | Status: DC

## 2014-06-02 MED ORDER — ONE-DAILY MULTI VITAMINS PO TABS: 1.0000 | ORAL_TABLET | Freq: Every day | ORAL | Status: DC

## 2014-06-02 MED ORDER — LACTATED RINGERS IV SOLN
INTRAVENOUS | Status: DC
  Administered 2014-06-02: 50 mL/h via INTRAVENOUS

## 2014-06-02 SURGICAL SUPPLY — 2 items
KIT ROOM TURNOVER OR (KITS) ×2 IMPLANT
PAD ARMBOARD 7.5X6 YLW CONV (MISCELLANEOUS) ×3 IMPLANT

## 2014-06-02 NOTE — ED Notes (Signed)
Pt reports getting up in middle of the night to go to the bathroom and fell on right hip. Pain is 8/10 after receiving fentanyl. Good bilateral pedal pulses palpated.

## 2014-06-02 NOTE — Transfer of Care (Signed)
Immediate Anesthesia Transfer of Care Note  Patient: [name]  Procedure(s) Performed: Procedure(s): CLOSED MANIPULATION HIP (Right)  Patient Location: PACU  Anesthesia Type:General  Level of Consciousness: awake and alert   Airway & Oxygen Therapy: Patient Spontanous Breathing and Patient connected to nasal cannula oxygen  Post-op Assessment: Report given to PACU RN and Post -op Vital signs reviewed and stable  Post vital signs: Reviewed and stable  Complications: No apparent anesthesia complications

## 2014-06-02 NOTE — Op Note (Signed)
NAMEMarland [name]  [name], [name] NO.:  [id number]  MEDICAL RECORD NO.:  [id number]  LOCATION:  [facility]                         FACILITY:  [facility]  PHYSICIAN:  Lubertha Basque. Yameli Delamater, M.D.DATE OF BIRTH:  28-Oct-1955  DATE OF PROCEDURE:  06/02/2014 DATE OF DISCHARGE:                              OPERATIVE REPORT   PREOPERATIVE DIAGNOSIS:  Right hip fracture dislocation.  POSTOPERATIVE DIAGNOSIS:  Right hip fracture dislocation.  PROCEDURE:  Right hip closed reduction.  ANESTHESIA:  General.  ATTENDING SURGEON:  Lubertha Basque. Jerl Santos, MD  ASSISTANT:  Elodia Florence, PA  INDICATION FOR PROCEDURE:  The patient is a 58 year old woman 3 weeks from a right hip revision replacement procedure done in [place]. She has been convalescing at home using a walker, touchdown weightbearing in a brace.  Unfortunately, she tripped and fell last night.  She eventually came to the [facility] Room this afternoon and x-ray showed a fracture dislocation of a total hip replacement.  She had a mildly displaced greater trochanter fracture and also evidence of osteotomy of the femur which was necessary at the time of her revision procedure.  There was also some concern about a crack in the bone towards the tip of the prosthesis.  The hip replacement was completely displaced.  After discussion with her team and [facility], we elected to attempt a closed reduction here.  SUMMARY OF FINDINGS AND PROCEDURE:  Under general anesthesia, with significant relaxation, we were able to reduce the hip with some gentle traction and forward flexion.  This came into place and we then examined fluoroscopically.  Her greater trochanteric fracture was slightly more displaced and unfortunately the crack at the tip of the prosthesis was significantly more displaced.  The hip joint was well reduced.  She was admitted for overnight observation with probable transfer to [facility] in the morning for  definitive care.  DESCRIPTION OF PROCEDURE:  The patient was taken to operating suite where general anesthetic was applied without difficulty.  She was placed on x-ray capable table.  She was given significant muscle relaxant.  We then forward flexed her hip to 90 degrees and applied some traction. The hip came into place without extreme traction.  Her leg length was restored.  We then obtained some fluoroscopic views with findings as noted above.  She was placed back into her original brace and was allowed to wake up.  DISPOSITION:  The patient was taken to recovery room in stable condition.  Plans were for her to stay overnight for observation with probable transfer to [facility] in the morning for definitive care by Dr. Carola Rhine.     Lubertha Basque Jerl Santos, M.D.     PGD/MEDQ  D:  06/02/2014  T:  06/02/2014  Job:  [id number]

## 2014-06-02 NOTE — ED Notes (Signed)
OR called to state they are ready for the patient

## 2014-06-02 NOTE — ED Provider Notes (Signed)
2:38 PM Attempted to evaluated patient multiple times. Patient not in room. Nurses aware. Patient is apparently still in XR.   Mora Bellman, PA-C 06/02/14 [id number]  Hilario Quarry, MD 06/02/14 [phone]

## 2014-06-02 NOTE — Interval H&P Note (Signed)
History and Physical Interval Note:  06/02/2014 8:08 PM  [name]  has presented today for surgery, with the diagnosis of Right Dislocated Hip  The various methods of treatment have been discussed with the patient and family. After consideration of risks, benefits and other options for treatment, the patient has consented to  Procedure(s): CLOSED MANIPULATION HIP (Right) as a surgical intervention .  The patient's history has been reviewed, patient examined, no change in status, stable for surgery.  I have reviewed the patient's chart and labs.  Questions were answered to the patient's satisfaction.     [name]

## 2014-06-02 NOTE — ED Notes (Signed)
Pt requests catheter to urinate. RN informed of pt's request.

## 2014-06-02 NOTE — Anesthesia Procedure Notes (Signed)
Date/Time: 06/02/2014 8:27 PM Performed by: Luster Landsberg Pre-anesthesia Checklist: Patient identified, Emergency Drugs available, Suction available, Patient being monitored and Timeout performed Patient Re-evaluated:Patient Re-evaluated prior to inductionOxygen Delivery Method: Circle system utilized Preoxygenation: Pre-oxygenation with 100% oxygen Intubation Type: IV induction Ventilation: Mask ventilation without difficulty, Mask ventilation throughout procedure and Oral airway inserted - appropriate to patient size Placement Confirmation: positive ETCO2

## 2014-06-02 NOTE — ED Provider Notes (Signed)
ppatient tripped and fell last night injuring right hip. She's been unable and relates since event. She complains of right hip pain no other injury.  Doug Sou, MD 06/03/14 [phone]

## 2014-06-02 NOTE — ED Notes (Signed)
Pt presents to department for evaluation of fall.. States R hip surgery several weeks ago, states she tripped and fell on R leg last night while trying to use bathroom. 9/10 pain upon arrival to ED. Pt states she thinks R leg is rotated outward.

## 2014-06-02 NOTE — Anesthesia Preprocedure Evaluation (Signed)
Anesthesia Evaluation  Patient identified by MRN, date of birth, ID band Patient awake    Reviewed: Allergy & Precautions, H&P , NPO status , Patient's Chart, lab work & pertinent test results, reviewed documented beta blocker date and time   Airway Mallampati: II  TM Distance: >3 FB Neck ROM: Full    Dental  (+) Teeth Intact   Pulmonary neg sleep apnea, neg COPDneg recent URI, Current Smoker,  breath sounds clear to auscultation        Cardiovascular hypertension, Pt. on medications and Pt. on home beta blockers - angina- CAD and - Past MI + dysrhythmias Rhythm:Regular  VSD   Neuro/Psych PSYCHIATRIC DISORDERS Depression negative neurological ROS     GI/Hepatic Neg liver ROS, GERD-  Medicated and Controlled,  Endo/Other  diabetes, Type 2, Oral Hypoglycemic AgentsHypothyroidism Morbid obesity  Renal/GU negative Renal ROS     Musculoskeletal Right hip dislocation   Abdominal   Peds  Hematology  (+) Blood dyscrasia, anemia ,   Anesthesia Other Findings   Reproductive/Obstetrics                             Anesthesia Physical Anesthesia Plan  ASA: III  Anesthesia Plan: General   Post-op Pain Management:    Induction: Intravenous  Airway Management Planned: Mask  Additional Equipment: None  Intra-op Plan:   Post-operative Plan:   Informed Consent: I have reviewed the patients History and Physical, chart, labs and discussed the procedure including the risks, benefits and alternatives for the proposed anesthesia with the patient or authorized representative who has indicated his/her understanding and acceptance.   Dental advisory given  Plan Discussed with: CRNA and Surgeon  Anesthesia Plan Comments:         Anesthesia Quick Evaluation

## 2014-06-02 NOTE — ED Notes (Signed)
Pt monitored by pulse ox, bp cuff, and 5-lead. 

## 2014-06-02 NOTE — Op Note (Signed)
.#  [id number] 

## 2014-06-02 NOTE — ED Provider Notes (Signed)
CSN: [id number]     Arrival date & time 06/02/14  1301 History    Chief Complaint  Patient presents with  . Fall    HPI Comments: 58 y.o. Female with a history of HTN, DM, Osteoporosis presents to the ED   Patient is a 58 y.o. female presenting with hip pain. The history is provided by the patient.  Hip Pain This is a new problem. The current episode started yesterday. The problem occurs constantly. The problem has been unchanged. Pertinent negatives include no abdominal pain, chest pain, coughing, fever, neck pain or vomiting. The symptoms are aggravated by walking and standing. She has tried rest for the symptoms. The treatment provided no relief.    Past Medical History  Diagnosis Date  . Hypertension   . Ventricular septal defect   . Paroxysmal ventricular tachycardia   . Diabetes mellitus    Past Surgical History  Procedure Laterality Date  . Shoulder arthroscopy      right  . Hernia repair    . Joint replacement    . Abdominal hysterectomy     Family History  Problem Relation Age of Onset  . Stroke Father 642    deceased  . Other Mother     alive  . Heart attack Other     multiple uncles have died with myocardial infarction   History  Substance Use Topics  . Smoking status: Current Every Day Smoker    Types: Cigarettes  . Smokeless tobacco: Never Used  . Alcohol Use: No   OB History    No data available     Review of Systems  Constitutional: Negative for fever.  Respiratory: Negative for cough.   Cardiovascular: Negative for chest pain.  Gastrointestinal: Negative for vomiting and abdominal pain.  Musculoskeletal: Negative for neck pain.  All other systems reviewed and are negative.  Allergies  Review of patient's allergies indicates no known allergies.  Home Medications   Prior to Admission medications   Medication Sig Start Date End Date Taking? Authorizing Provider  aspirin 81 MG tablet Take 81 mg by mouth daily.      Historical Provider, MD   buPROPion (WELLBUTRIN XL) 300 MG 24 hr tablet Take 300 mg by mouth daily.      Historical Provider, MD  carvedilol (COREG) 3.125 MG tablet Take 1 tablet (3.125 mg total) by mouth 2 (two) times daily. 09/19/13   Laurey Moralealton S McLean, MD  celecoxib (CELEBREX) 200 MG capsule Take 200 mg by mouth 2 (two) times daily.    Historical Provider, MD  fluvoxaMINE (LUVOX) 100 MG tablet 100 mg. Take 3 capsules by mouth at bed time     Historical Provider, MD  gabapentin (NEURONTIN) 300 MG capsule 300 mg. Take 2 cap. At bedtime     Historical Provider, MD  levothyroxine (SYNTHROID, LEVOTHROID) 50 MCG tablet Take 50 mcg by mouth daily before breakfast.    Historical Provider, MD  losartan-hydrochlorothiazide (HYZAAR) 100-12.5 MG per tablet Take 1 tablet by mouth daily. 02/02/12   Herby Abrahamhomas D Stuckey, MD  meloxicam (MOBIC) 15 MG tablet Take 15 mg by mouth as needed.  07/27/11   Historical Provider, MD  metFORMIN (GLUCOPHAGE) 1000 MG tablet Take 1,000 mg by mouth daily.      Historical Provider, MD  Multiple Vitamin (MULTIVITAMIN) tablet Take 1 tablet by mouth daily.      Historical Provider, MD  omeprazole (PRILOSEC OTC) 20 MG tablet Take 20 mg by mouth daily.      Historical  Provider, MD  oxyCODONE-acetaminophen (PERCOCET) 5-325 MG per tablet Take 1 tablet by mouth every 4 (four) hours as needed for pain.    Historical Provider, MD  QUEtiapine (SEROQUEL) 300 MG tablet Take 600 mg by mouth at bedtime.      Historical Provider, MD  traZODone (DESYREL) 100 MG tablet Take 100 mg by mouth at bedtime.      Historical Provider, MD   BP 123/78 mmHg  Pulse 93  Temp(Src) 98.6 F (37 C) (Oral)  Resp 18  Ht 5\' 2"  (1.575 m)  Wt 184 lb (83.462 kg)  BMI 33.65 kg/m2  SpO2 99%   Physical Exam  Constitutional: She is oriented to person, place, and time. She appears well-developed and well-nourished. She is cooperative. She appears distressed (crying).  HENT:  Head: Normocephalic and atraumatic.  Right Ear: External ear normal.   Left Ear: External ear normal.  Neck: Normal range of motion and phonation normal.  Cardiovascular: Normal rate and regular rhythm.   Pulmonary/Chest: Effort normal and breath sounds normal. No respiratory distress. She has no wheezes. She has no rales.  Abdominal: Soft. She exhibits no distension. There is no tenderness. There is no rebound and no guarding.  Musculoskeletal:       Right hip: She exhibits bony tenderness.  2+ DP pulse in RLE, moves toes and ankles, normal sensation to light touch in RLE  Neurological: She is alert and oriented to person, place, and time.  Skin: Skin is warm and dry. No rash noted. She is not diaphoretic.  Vitals reviewed.   ED Course  Procedures   3:08 PM Patient still in X-ray; attempted to evaluate her, not in room  3:22 PM Attempted to evaluate patient; not in room -- nursing called X-Ray to locate the patient, awaiting to hear where she is  3:29 PM Patient is still in X-Ray; I reviewed her films, notable to right hip dislocated prosthesis with an associated fracture of the greater trochanter and the shaft of the femur; the patient has received a dose of pain medication while here, will evaluate ASAP when the patient returns from X-Ray  Labs Review Labs Reviewed  URINALYSIS, ROUTINE W REFLEX MICROSCOPIC - Abnormal; Notable for the following:    APPearance CLOUDY (*)    Nitrite POSITIVE (*)    Leukocytes, UA LARGE (*)    All other components within normal limits  URINE MICROSCOPIC-ADD ON - Abnormal; Notable for the following:    Bacteria, UA MANY (*)    All other components within normal limits  CBC WITH DIFFERENTIAL - Abnormal; Notable for the following:    WBC 11.0 (*)    RBC 3.09 (*)    Hemoglobin 9.6 (*)    HCT 29.2 (*)    Eosinophils Relative 12 (*)    Eosinophils Absolute 1.3 (*)    All other components within normal limits  BASIC METABOLIC PANEL - Abnormal; Notable for the following:    Sodium 129 (*)    Chloride 92 (*)     Glucose, Bld 106 (*)    Anion gap 16 (*)    All other components within normal limits  URINE CULTURE  PROTIME-INR  GLUCOSE, CAPILLARY    Imaging Review Dg Pelvis 1-2 Views  06/02/2014   CLINICAL DATA:  Fall with right hip pain. History of bilateral hip replacement.  EXAM: PELVIS - 1-2 VIEW  COMPARISON:  None.  FINDINGS: There is been superior dislocation of the right femoral component of the right total hip replacement.  Fractures of the greater trochanter and proximal shaft of the right femur identified.  A left total hip prosthesis appear located.  IMPRESSION: Superior dislocation of the right femoral component of the right total hip replacement with associated fractures of the proximal right femur.   Electronically Signed   By: Laveda Abbe M.D.   On: 06/02/2014 16:17   Dg Femur Right  06/02/2014   CLINICAL DATA:  58 year old female with right hip pain after falling while walking with her walker to the bathroom. History of hip arthroplasty revision surgery 3 weeks ago.  EXAM: RIGHT FEMUR - 2 VIEW  COMPARISON:  Concurrently obtained radiographs of the pelvis  FINDINGS: Superolateral dislocation of the femoral head component with respect to the acetabular component. Additionally, periprosthetic fracture lines are present in the proximal femoral diaphysis. The chronicity of these fracture lines is uncertain as multiple cerclage wires are present and there is a long stem femoral component suggesting a history of prior periprosthetic fracture. There is a more subtle lucency in the femoral cortex distally near the tip of the femoral component. This is not clearly extend through the cortex and may represent a vascular channel.  IMPRESSION: 1. Bone superolateral dislocation of the femoral head component of the revised right hip arthroplasty with respect to the acetabular cup. 2. Periprosthetic fracture lines with cerclage wires in place. Given the clinical history of recent revision arthroplasty 3 weeks ago,  these fractures are favored to be subacute. Recommend correlation with outside imaging if available.   Electronically Signed   By: Malachy Moan M.D.   On: 06/02/2014 16:18    MDM   Final diagnoses:  Fall  Closed right hip fracture, initial encounter   58 y.o. female with a recent revision of a total hip arthroplasty 3 weeks ago with a Careers adviser at [facility]. Presents after having a mechanical fall last night. Reports that when using her walker to get to her bedside commode she got tripped up and fell onto her right hip. She immediately felt a pop. Her husband assisted getting her into bed. She has had pain ever since.  X-rays of the right hip and femur show superior dislocation of her right total hip placement. Shows periprosthetic fractures of the greater trochanter and proximal femur. She is a patient of [facility]. The on-call physician Dr. Jerl Santos was consulted. Given her recent operation by a [facility] physician, recommended that we call [facility] regarding a potential transfer. I spoke with Dr. Jena Gauss of [facility] orthopedic surgery who requested to speak to Dr. Jerl Santos. After discussion it was decided that Dr. Jerl Santos would take the patient to the OR for a closed reduction of her hip dislocation. The patient was updated about the plan and was agreeable to this plan.   The patient was treated with IV pain medication. Noted to have a UTI -- given Rocephin. Foley placed for patient comfort.   She remained stable in the ED and was taken to the OR with Dr. Jerl Santos.    Maxine Glenn, MD 06/03/14 0000  Doug Sou, MD 06/03/14 [id number]

## 2014-06-02 NOTE — H&P (Signed)
Marcene Corning, MD  Bryna Colander, PA-C  Elodia Florence, PA-C                                  [facility]                [address] [place], [place]  [address]   ORTHOPAEDIC CONSULTATION  MILDERD MANOCCHIO            MRN:  [id number] DOB/SEX:  11/22/1955/female     CHIEF COMPLAINT:  Painful right hip  HISTORY: Marissa Biederman Trotteris a 58 y.o. female with dislocated THR.  Was placed three weeks ago in [facility] by Dr Orlean Bradford.  Tripped at home with walker and could not get up.  Taken to ER via ambulance.  ORS consulted.  I have discussed with Dr Orlean Bradford and he would like [place] to try a closed reduction here.   PAST MEDICAL HISTORY: Patient Active Problem List   Diagnosis Date Noted  . UNSPECIFIED HEART FAILURE 06/24/2010  . UNSPECIFIED CONGENITAL DEFECT OF SEPTAL CLOSURE 06/24/2010  . TOBACCO ABUSE 03/18/2010  . CARDIOMYOPATHY, SECONDARY 03/18/2010  . DM 06/13/2009  . HYPERTENSION, UNSPECIFIED 06/13/2009  . VENTRICULAR TACHYCARDIA 06/13/2009  . VENTRICULAR SEPTAL DEFECT, CONGENITAL 06/13/2009   Past Medical History  Diagnosis Date  . Hypertension   . Ventricular septal defect   . Paroxysmal ventricular tachycardia   . Diabetes mellitus    Past Surgical History  Procedure Laterality Date  . Shoulder arthroscopy      right  . Hernia repair    . Joint replacement    . Abdominal hysterectomy       MEDICATIONS:  No current facility-administered medications for this encounter. Current outpatient prescriptions: aspirin 81 MG tablet, Take 81 mg by mouth daily.  , Disp: , Rfl: ;  buPROPion (WELLBUTRIN XL) 300 MG 24 hr tablet, Take 300 mg by mouth daily.  , Disp: , Rfl: ;  fluvoxaMINE (LUVOX) 100 MG tablet, Take 300 mg by mouth at bedtime. , Disp: , Rfl: ;  gabapentin (NEURONTIN) 300 MG capsule, Take 600 mg by mouth at bedtime. , Disp: , Rfl:  levothyroxine (SYNTHROID, LEVOTHROID) 50 MCG tablet, Take 50 mcg by mouth daily before breakfast., Disp: , Rfl: ;   losartan-hydrochlorothiazide (HYZAAR) 100-12.5 MG per tablet, Take 1 tablet by mouth daily., Disp: 30 tablet, Rfl: 9;  meloxicam (MOBIC) 15 MG tablet, Take 15 mg by mouth as needed for pain. , Disp: , Rfl: ;  metFORMIN (GLUCOPHAGE) 1000 MG tablet, Take 1,000 mg by mouth daily.  , Disp: , Rfl:  Multiple Vitamin (MULTIVITAMIN) tablet, Take 1 tablet by mouth daily.  , Disp: , Rfl: ;  omeprazole (PRILOSEC OTC) 20 MG tablet, Take 20 mg by mouth daily.  , Disp: , Rfl: ;  oxyCODONE-acetaminophen (PERCOCET) 5-325 MG per tablet, Take 1 tablet by mouth every 4 (four) hours as needed for pain., Disp: , Rfl: ;  QUEtiapine (SEROQUEL) 300 MG tablet, Take 600 mg by mouth at bedtime.  , Disp: , Rfl:  traZODone (DESYREL) 100 MG tablet, Take 100 mg by mouth at bedtime.  , Disp: , Rfl: ;  carvedilol (COREG) 3.125 MG tablet, Take 1 tablet (3.125 mg total) by mouth 2 (two) times daily. (Patient not taking: Reported on 06/02/2014), Disp: 60 tablet, Rfl: 11  ALLERGIES:  No Known Allergies  REVIEW OF SYSTEMS: REVIEWED IN DETAIL IN CHART  FAMILY HISTORY:  Family History  Problem Relation Age of Onset  . Stroke Father 7342    deceased  . Other Mother     alive  . Heart attack Other     multiple uncles have died with myocardial infarction    SOCIAL HISTORY:   History  Substance Use Topics  . Smoking status: Current Every Day Smoker    Types: Cigarettes  . Smokeless tobacco: Never Used  . Alcohol Use: No     EXAMINATION: Vital signs in last 24 hours: Temp:  [98.6 F (37 C)] 98.6 F (37 C) (11/15 1942) Pulse Rate:  [78-96] 80 (11/15 1930) Resp:  [10-21] 18 (11/15 1930) BP: (89-126)/(40-80) 126/72 mmHg (11/15 1915) SpO2:  [94 %-100 %] 96 % (11/15 1930) Weight:  [83.462 kg (184 lb)] 83.462 kg (184 lb) (11/15 1318)  BP 126/72 mmHg  Pulse 80  Temp(Src) 98.6 F (37 C) (Oral)  Resp 18  Ht 5\' 2"  (1.575 m)  Wt 83.462 kg (184 lb)  BMI 33.65 kg/m2  SpO2 96%  General Appearance:    Alert, cooperative, no  distress, appears stated age  Head:    Normocephalic, without obvious abnormality, atraumatic  Eyes:    PERRL, conjunctiva/corneas clear, EOM's intact, fundi    benign, both eyes  Ears:    Normal TM's and external ear canals, both ears  Nose:   Nares normal, septum midline, mucosa normal, no drainage    or sinus tenderness  Throat:   Lips, mucosa, and tongue normal; teeth and gums normal  Neck:   Supple, symmetrical, trachea midline, no adenopathy;    thyroid:  no enlargement/tenderness/nodules; no carotid   bruit or JVD  Back:     Symmetric, no curvature, ROM normal, no CVA tenderness  Lungs:     Clear to auscultation bilaterally, respirations unlabored  Chest Wall:    No tenderness or deformity   Heart:    Regular rate and rhythm, S1 and S2 normal, no murmur, rub   or gallop  Breast Exam:    No tenderness, masses, or nipple abnormality  Abdomen:     Soft, non-tender, bowel sounds active all four quadrants,    no masses, no organomegaly  Genitalia:    Normal female without lesion, discharge or tenderness  Rectal:    Normal tone, normal prostate, no masses or tenderness;   guaiac negative stool  Extremities:   Extremities normal, atraumatic, no cyanosis or edema  Pulses:   2+ and symmetric all extremities  Skin:   Skin color, texture, turgor normal, no rashes or lesions  Lymph nodes:   Cervical, supraclavicular, and axillary nodes normal  Neurologic:   CNII-XII intact, normal strength, sensation and reflexes    throughout    Musculoskeletal Exam:   Right hip is SER.  Incision looks benign   DIAGNOSTIC STUDIES: Recent laboratory studies:  Recent Labs  06/02/14 1831  WBC 11.0*  HGB 9.6*  HCT 29.2*  PLT 386    Recent Labs  06/02/14 1831  NA 129*  K 4.3  CL 92*  CO2 21  BUN 8  CREATININE 0.56  GLUCOSE 106*  CALCIUM 9.0   Lab Results  Component Value Date   INR 1.07 06/02/2014     Recent Radiographic Studies :  Dg Pelvis 1-2 Views  06/02/2014   CLINICAL DATA:   Fall with right hip pain. History of bilateral hip replacement.  EXAM: PELVIS - 1-2 VIEW  COMPARISON:  None.  FINDINGS: There is been superior dislocation of the  right femoral component of the right total hip replacement.  Fractures of the greater trochanter and proximal shaft of the right femur identified.  A left total hip prosthesis appear located.  IMPRESSION: Superior dislocation of the right femoral component of the right total hip replacement with associated fractures of the proximal right femur.   Electronically Signed   By: Laveda Abbe M.D.   On: 06/02/2014 16:17   Dg Femur Right  06/02/2014   CLINICAL DATA:  59 year old female with right hip pain after falling while walking with her walker to the bathroom. History of hip arthroplasty revision surgery 3 weeks ago.  EXAM: RIGHT FEMUR - 2 VIEW  COMPARISON:  Concurrently obtained radiographs of the pelvis  FINDINGS: Superolateral dislocation of the femoral head component with respect to the acetabular component. Additionally, periprosthetic fracture lines are present in the proximal femoral diaphysis. The chronicity of these fracture lines is uncertain as multiple cerclage wires are present and there is a long stem femoral component suggesting a history of prior periprosthetic fracture. There is a more subtle lucency in the femoral cortex distally near the tip of the femoral component. This is not clearly extend through the cortex and may represent a vascular channel.  IMPRESSION: 1. Bone superolateral dislocation of the femoral head component of the revised right hip arthroplasty with respect to the acetabular cup. 2. Periprosthetic fracture lines with cerclage wires in place. Given the clinical history of recent revision arthroplasty 3 weeks ago, these fractures are favored to be subacute. Recommend correlation with outside imaging if available.   Electronically Signed   By: Malachy Moan M.D.   On: 06/02/2014 16:18    ASSESSMENT:  Dislocated THR  with greater troch fracture   PLAN:  Will attempt CR here in OR with extreme relaxation.  Discussed with [place].  Will replace brace and keep overnight if all goes well.  Laylah Riga G 06/02/2014, 8:05 PM

## 2014-06-03 ENCOUNTER — Encounter (HOSPITAL_COMMUNITY): Payer: Self-pay | Admitting: General Practice

## 2014-06-03 DIAGNOSIS — N390 Urinary tract infection, site not specified: Secondary | ICD-10-CM | POA: Diagnosis present

## 2014-06-03 DIAGNOSIS — M979XXA Periprosthetic fracture around unspecified internal prosthetic joint, initial encounter: Secondary | ICD-10-CM | POA: Insufficient documentation

## 2014-06-03 LAB — GLUCOSE, CAPILLARY
GLUCOSE-CAPILLARY: 135 mg/dL — AB (ref 70–99)
Glucose-Capillary: 157 mg/dL — ABNORMAL HIGH (ref 70–99)
Glucose-Capillary: 126 mg/dL — ABNORMAL HIGH (ref 70–99)

## 2014-06-03 NOTE — Progress Notes (Signed)
UR completed 

## 2014-06-03 NOTE — Plan of Care (Signed)
Problem: Phase II Progression Outcomes Goal: Tolerating diet Outcome: Completed/Met Date Met:  06/03/14 Goal: Discharge plan established Outcome: Completed/Met Date Met:  06/03/14 Goal: Other Phase II Outcomes/Goals Outcome: Not Applicable Date Met:  90/37/95

## 2014-06-03 NOTE — Anesthesia Postprocedure Evaluation (Signed)
  Anesthesia Post-op Note  Patient: [name]  Procedure(s) Performed: Procedure(s): CLOSED MANIPULATION HIP (Right)  Patient Location: PACU  Anesthesia Type:General  Level of Consciousness: awake and alert   Airway and Oxygen Therapy: Patient Spontanous Breathing  Post-op Pain: moderate  Post-op Assessment: Post-op Vital signs reviewed, Patient's Cardiovascular Status Stable, Respiratory Function Stable, Patent Airway, No signs of Nausea or vomiting and Pain level controlled  Post-op Vital Signs: Reviewed and stable  Last Vitals:  Filed Vitals:   06/03/14 0530  BP: 102/70  Pulse: 102  Temp: 37.5 C  Resp: 16    Complications: No apparent anesthesia complications

## 2014-06-03 NOTE — Discharge Summary (Signed)
Patient ID: [name] MRN: [id number] DOB/AGE: 59-Nov-1957 60 y.o.  Admit date: 06/02/2014 Discharge date: 06/03/2014  Admission Diagnoses:  Principal Problem:   Peri-prosthetic fracture of femur following total hip arthroplasty Active Problems:   Diabetes   UTI (urinary tract infection)   Discharge Diagnoses:  Same  Past Medical History  Diagnosis Date  . Hypertension   . Ventricular septal defect   . Paroxysmal ventricular tachycardia   . Diabetes mellitus     Surgeries: Procedure(s): CLOSED MANIPULATION HIP on 06/02/2014   Consultants:    Discharged Condition: Improved  Hospital Course: [name] is an 58 y.o. female who was admitted 06/02/2014 for operative treatment ofPeri-prosthetic fracture of femur following total hip arthroplasty. Patient has severe unremitting pain that affects sleep, daily activities, and work/hobbies. After pre-op clearance the patient was taken to the operating room on 06/02/2014 and underwent  Procedure(s): CLOSED MANIPULATION HIP.    Patient was given perioperative antibiotics: Anti-infectives    Start     Dose/Rate Route Frequency Ordered Stop   06/02/14 2200  ciprofloxacin (CIPRO) tablet 500 mg     500 mg Oral 2 times daily 06/02/14 2118 06/07/14 1959   06/02/14 1745  cefTRIAXone (ROCEPHIN) 1 g in dextrose 5 % 50 mL IVPB     1 g100 mL/hr over 30 Minutes Intravenous  Once 06/02/14 1736 06/02/14 1857       Patient was given sequential compression devices, early ambulation, and chemoprophylaxis to prevent DVT.  Patient benefited maximally from hospital stay and there were no complications.   Patient has UTI and was being treated with Cipro 500 mg BID. Would recommend continuing this upon arrival at new facility. She has only been treated since she got to this facility last night.    Recent vital signs: Patient Vitals for the past 24 hrs:  BP Temp Temp src Pulse Resp SpO2 Height Weight  06/03/14 0530 102/70 mmHg  99.5 F (37.5 C) Oral (!) 102 16 94 % - -  06/03/14 0235 96/63 mmHg 99 F (37.2 C) Oral 94 16 93 % - -  06/02/14 2205 110/79 mmHg 98.8 F (37.1 C) Oral 87 18 97 % - -  06/02/14 2136 113/68 mmHg 98.3 F (36.8 C) - 88 14 96 % - -  06/02/14 2130 (!) 107/55 mmHg - - (!) 56 17 94 % - -  06/02/14 2115 106/62 mmHg - - (!) 111 12 95 % - -  06/02/14 2100 132/75 mmHg - - 84 14 97 % - -  06/02/14 2053 - - - 84 15 97 % - -  06/02/14 2045 120/80 mmHg - - 84 18 99 % - -  06/02/14 2041 115/80 mmHg 97.7 F (36.5 C) - 81 20 99 % - -  06/02/14 1942 - 98.6 F (37 C) Oral - - - - -  06/02/14 1930 - - - 80 18 96 % - -  06/02/14 1915 126/72 mmHg - - 79 10 100 % - -  06/02/14 1900 - - - 81 19 99 % - -  06/02/14 1845 112/77 mmHg - - 78 16 98 % - -  06/02/14 1830 110/75 mmHg - - 79 14 95 % - -  06/02/14 1815 112/70 mmHg - - 80 14 94 % - -  06/02/14 1800 106/62 mmHg - - 78 17 99 % - -  06/02/14 1745 104/61 mmHg - - 79 16 99 % - -  06/02/14 1730 (!) 89/73 mmHg - - 83 21  95 % - -  06/02/14 1715 (!) 99/40 mmHg - - 84 19 98 % - -  06/02/14 1700 102/80 mmHg - - 91 15 97 % - -  06/02/14 1645 90/66 mmHg - - 89 18 98 % - -  06/02/14 1630 95/58 mmHg - - 89 17 95 % - -  06/02/14 1615 93/69 mmHg - - 89 16 98 % - -  06/02/14 1602 115/66 mmHg - - 96 18 98 % - -  06/02/14 1318 123/78 mmHg 98.6 F (37 C) Oral 93 18 99 % 5\' 2"  (1.575 m) 83.462 kg (184 lb)     Recent laboratory studies:  Recent Labs  06/02/14 1831  WBC 11.0*  HGB 9.6*  HCT 29.2*  PLT 386  NA 129*  K 4.3  CL 92*  CO2 21  BUN 8  CREATININE 0.56  GLUCOSE 106*  INR 1.07  CALCIUM 9.0     Discharge Medications:     Medication List    TAKE these medications        aspirin 81 MG tablet  Take 81 mg by mouth daily.     buPROPion 300 MG 24 hr tablet  Commonly known as:  WELLBUTRIN XL  Take 300 mg by mouth daily.     carvedilol 3.125 MG tablet  Commonly known as:  COREG  Take 1 tablet (3.125 mg total) by mouth 2 (two) times daily.      fluvoxaMINE 100 MG tablet  Commonly known as:  LUVOX  Take 300 mg by mouth at bedtime.     gabapentin 300 MG capsule  Commonly known as:  NEURONTIN  Take 600 mg by mouth at bedtime.     levothyroxine 50 MCG tablet  Commonly known as:  SYNTHROID, LEVOTHROID  Take 50 mcg by mouth daily before breakfast.     losartan-hydrochlorothiazide 100-12.5 MG per tablet  Commonly known as:  HYZAAR  Take 1 tablet by mouth daily.     meloxicam 15 MG tablet  Commonly known as:  MOBIC  Take 15 mg by mouth as needed for pain.     metFORMIN 1000 MG tablet  Commonly known as:  GLUCOPHAGE  Take 1,000 mg by mouth daily.     multivitamin tablet  Take 1 tablet by mouth daily.     omeprazole 20 MG tablet  Commonly known as:  PRILOSEC OTC  Take 20 mg by mouth daily.     PERCOCET 5-325 MG per tablet  Generic drug:  oxyCODONE-acetaminophen  Take 1 tablet by mouth every 4 (four) hours as needed for pain.     SEROQUEL 300 MG tablet  Generic drug:  QUEtiapine  Take 600 mg by mouth at bedtime.     traZODone 100 MG tablet  Commonly known as:  DESYREL  Take 100 mg by mouth at bedtime.        Diagnostic Studies: Dg Pelvis 1-2 Views  06/02/2014   CLINICAL DATA:  Fall with right hip pain. History of bilateral hip replacement.  EXAM: PELVIS - 1-2 VIEW  COMPARISON:  None.  FINDINGS: There is been superior dislocation of the right femoral component of the right total hip replacement.  Fractures of the greater trochanter and proximal shaft of the right femur identified.  A left total hip prosthesis appear located.  IMPRESSION: Superior dislocation of the right femoral component of the right total hip replacement with associated fractures of the proximal right femur.   Electronically Signed   By: Dolores Frame.D.  On: 06/02/2014 16:17   Dg Femur Right  06/02/2014   CLINICAL DATA:  58 year old female with right hip pain after falling while walking with her walker to the bathroom. History of hip arthroplasty  revision surgery 3 weeks ago.  EXAM: RIGHT FEMUR - 2 VIEW  COMPARISON:  Concurrently obtained radiographs of the pelvis  FINDINGS: Superolateral dislocation of the femoral head component with respect to the acetabular component. Additionally, periprosthetic fracture lines are present in the proximal femoral diaphysis. The chronicity of these fracture lines is uncertain as multiple cerclage wires are present and there is a long stem femoral component suggesting a history of prior periprosthetic fracture. There is a more subtle lucency in the femoral cortex distally near the tip of the femoral component. This is not clearly extend through the cortex and may represent a vascular channel.  IMPRESSION: 1. Bone superolateral dislocation of the femoral head component of the revised right hip arthroplasty with respect to the acetabular cup. 2. Periprosthetic fracture lines with cerclage wires in place. Given the clinical history of recent revision arthroplasty 3 weeks ago, these fractures are favored to be subacute. Recommend correlation with outside imaging if available.   Electronically Signed   By: Malachy MoanHeath  McCullough M.D.   On: 06/02/2014 16:18    Disposition:       Discharge Instructions    Call MD / Call 911    Complete by:  As directed   If you experience chest pain or shortness of breath, CALL 911 and be transported to the hospital emergency room.  If you develope a fever above 101 F, pus (white drainage) or increased drainage or redness at the wound, or calf pain, call your surgeon's office.     Constipation Prevention    Complete by:  As directed   Drink plenty of fluids.  Prune juice may be helpful.  You may use a stool softener, such as Colace (over the counter) 100 mg twice a day.  Use MiraLax (over the counter) for constipation as needed.     Diet - low sodium heart healthy    Complete by:  As directed      Increase activity slowly as tolerated    Complete by:  As directed             Follow-up Information    Follow up with Carola RhineLCOTT,CHRISTOPHER, MD.   Specialty:  Orthopedic Surgery   Contact information:   [id number] [employer] [place] [id number] [phone]        Signed: Drema HalonIDA, Errin Chewning PAUL 06/03/2014, 9:10 AM

## 2014-06-05 LAB — URINE CULTURE

## 2014-10-17 ENCOUNTER — Ambulatory Visit (INDEPENDENT_AMBULATORY_CARE_PROVIDER_SITE_OTHER): Payer: Medicare Other | Admitting: Cardiology

## 2014-10-17 ENCOUNTER — Encounter: Payer: Self-pay | Admitting: Cardiology

## 2014-10-17 ENCOUNTER — Encounter: Payer: Self-pay | Admitting: *Deleted

## 2014-10-17 VITALS — BP 124/72 | HR 108 | Ht 62.0 in | Wt 184.0 lb

## 2014-10-17 DIAGNOSIS — M1990 Unspecified osteoarthritis, unspecified site: Secondary | ICD-10-CM | POA: Insufficient documentation

## 2014-10-17 DIAGNOSIS — K219 Gastro-esophageal reflux disease without esophagitis: Secondary | ICD-10-CM | POA: Insufficient documentation

## 2014-10-17 DIAGNOSIS — I429 Cardiomyopathy, unspecified: Secondary | ICD-10-CM

## 2014-10-17 DIAGNOSIS — Q210 Ventricular septal defect: Secondary | ICD-10-CM | POA: Diagnosis not present

## 2014-10-17 DIAGNOSIS — I10 Essential (primary) hypertension: Secondary | ICD-10-CM | POA: Insufficient documentation

## 2014-10-17 DIAGNOSIS — F419 Anxiety disorder, unspecified: Secondary | ICD-10-CM | POA: Insufficient documentation

## 2014-10-17 DIAGNOSIS — E039 Hypothyroidism, unspecified: Secondary | ICD-10-CM | POA: Insufficient documentation

## 2014-10-17 MED ORDER — CARVEDILOL 3.125 MG PO TABS: 3.1250 mg | ORAL_TABLET | Freq: Two times a day (BID) | ORAL | Status: DC

## 2014-10-17 NOTE — Patient Instructions (Signed)
Start coreg (carvedilol) 3.125mg  two times a day.  Your physician has requested that you have an echocardiogram. Echocardiography is a painless test that uses sound waves to create images of your heart. It provides your doctor with information about the size and shape of your heart and how well your heart's chambers and valves are working. This procedure takes approximately one hour. There are no restrictions for this procedure.  Your physician wants you to follow-up in: 1 year with Dr Shirlee Latch. (March 2017).  You will receive a reminder letter in the mail two months in advance. If you don't receive a letter, please call our office to schedule the follow-up appointment.

## 2014-10-18 NOTE — Progress Notes (Signed)
Patient ID: [name], female   DOB: 05-26-1956, 59 y.o.   MRN: [id number] PCP: Dr. Nicholos Johns  59 yo with history of membranous VSD (small), cardiomyopathy (probably due to HTN, EF improved on most recent echo), and prior VT s/p ablation presents for cardiology followup.  In 2/15, she had a Lexiscan Cardiolite showing EF 61% and a fixed small apical defect with no ischemia.  Repeat echo in 2/15 showed EF 45-50% with small stable perimembranous VSD and and normal RV.     Since last appointment, she had a fall with right femur fracture in 11/15.  This was surgically repaired.  She is still using a wheelchair or walker with the leg immobilized.  She denies exertional dyspnea though she is not very active. No orthopnea or PND.  No chest pain.  No palpitations or lightheadedness.  She is still smoking. She is no longer taking Coreg.    ECG: Sinus tachy 108, poor RWP  Labs (8/13): K 4.3, creatinine 0.79 Labs (11/15): K 4.3, creatinine 0.56  PMH: 1. GERD 2. HTN 3. Membranous VSD: Small.  4. Scoliosis 5. VT: s/p VT ablation in the early 1990s in [place].  6. Cardiomyopathy: Possibly related to HTN.  This improved with control of BP.  Last echo in 3/13 showed EF 50-55%, small perimembranous VSD, mild LVH, mild AI, PA systolic pressure 32 mmHg.  Echo (2/15) with EF 45-50%, stable small perimembranous VSD, normal RV size and systolic function, mild aortic insufficiency.  7. Type II diabetes 8. Atypical chest pain: Lexiscan Cardiolite (2/15) with fixed small apical defect, no ischemia (low risk).  9. Bilateral THR  SH: Smokes 1-2 cigs/day, married, prior ETOH abuse (has quit).  Retired Runner, broadcasting/film/video, now Tree surgeon.   FH: Father with CVA at 60. Uncles with MIs  ROS: All systems reviewed and negative except as per HPI.    Current Outpatient Prescriptions  Medication Sig Dispense Refill  . aspirin 81 MG tablet Take 81 mg by mouth daily.      [name] buPROPion (WELLBUTRIN XL) 300 MG 24 hr tablet Take 300 mg  by mouth daily.      . fluvoxaMINE (LUVOX) 100 MG tablet Take 300 mg by mouth at bedtime.     . gabapentin (NEURONTIN) 300 MG capsule Take 600 mg by mouth at bedtime.     [name] levothyroxine (SYNTHROID, LEVOTHROID) 50 MCG tablet Take 50 mcg by mouth daily before breakfast.    . losartan-hydrochlorothiazide (HYZAAR) 100-12.5 MG per tablet Take 1 tablet by mouth daily. 30 tablet 9  . metFORMIN (GLUCOPHAGE) 1000 MG tablet Take 1,000 mg by mouth daily.      . Multiple Vitamin (MULTIVITAMIN) tablet Take 1 tablet by mouth daily.      [name] omeprazole (PRILOSEC OTC) 20 MG tablet Take 20 mg by mouth daily.      [name] oxyCODONE-acetaminophen (PERCOCET) 5-325 MG per tablet Take 1 tablet by mouth every 6 (six) hours as needed.     [name] QUEtiapine (SEROQUEL) 300 MG tablet Take 600 mg by mouth at bedtime.      . traZODone (DESYREL) 100 MG tablet Take 100 mg by mouth at bedtime.      . carvedilol (COREG) 3.125 MG tablet Take 1 tablet (3.125 mg total) by mouth 2 (two) times daily. 180 tablet 3   No current facility-administered medications for this visit.    BP 124/72 mmHg  Pulse 108  Ht  (1.575 m)  Wt 184 lb (83.462 kg)  BMI 33.65 kg/m2 General:  NAD Neck: No JVD, no thyromegaly or thyroid nodule.  Lungs: Clear to auscultation bilaterally with normal respiratory effort. CV: Nondisplaced PMI.  Heart regular S1/S2, no S3/S4, 1/6 SEM along the sternal border.  No peripheral edema.  No carotid bruit.  Normal pedal pulses.  Abdomen: Soft, nontender, no hepatosplenomegaly, no distention.  Skin: Intact without lesions or rashes.  Neurologic: Alert and oriented x 3.  Psych: Normal affect. Extremities: No clubbing or cyanosis.   1. Chest pain: No recent chest pain.  Lexiscan Cardiolite was low risk in 2/15 . Continue ASA 81 daily.  2. Smoking: I strongly encouraged her to quit smoking.   3. Hyperlipidemia: Given diabetes, I would strongly consider statin treatment.  I will call PCP for a copy of her most recent  lipids, unless they are quite low would want her on statin.  4. Perimembranous VSD: Soft murmur noted.  This is small and has been known for a long time. Stable on most recent echo in 2/15 with normal RV size and systolic function and mild AI.  5. Cardiomyopathy: Possibly related to HTN.  EF 45-50% on most recent echo, this is relatively stable.  BP is controlled now.  I would like her to start back on Coreg 3.125 mg bid. She is on losartan.   6. HTN: BP controlled on current regimen.  7. H/o VT: s/p ablation.  No symptomatic recurrence.   Marca Ancona 10/18/2014

## 2014-10-21 ENCOUNTER — Encounter: Payer: Self-pay | Admitting: Cardiology

## 2014-10-22 ENCOUNTER — Other Ambulatory Visit: Payer: Self-pay

## 2014-10-22 DIAGNOSIS — Z1231 Encounter for screening mammogram for malignant neoplasm of breast: Secondary | ICD-10-CM

## 2014-10-23 ENCOUNTER — Ambulatory Visit (HOSPITAL_COMMUNITY): Payer: Medicare Other | Attending: Cardiology | Admitting: Radiology

## 2014-10-23 DIAGNOSIS — I429 Cardiomyopathy, unspecified: Secondary | ICD-10-CM | POA: Diagnosis not present

## 2014-10-23 DIAGNOSIS — Q210 Ventricular septal defect: Secondary | ICD-10-CM | POA: Diagnosis not present

## 2014-10-23 NOTE — Progress Notes (Signed)
Echocardiogram performed.  

## 2014-10-24 ENCOUNTER — Other Ambulatory Visit: Payer: Self-pay | Admitting: *Deleted

## 2014-10-24 DIAGNOSIS — I429 Cardiomyopathy, unspecified: Secondary | ICD-10-CM

## 2014-10-24 MED ORDER — ATORVASTATIN CALCIUM 10 MG PO TABS: 10.0000 mg | ORAL_TABLET | Freq: Every day | ORAL | Status: DC

## 2014-11-01 ENCOUNTER — Ambulatory Visit
Admission: RE | Admit: 2014-11-01 | Discharge: 2014-11-01 | Disposition: A | Discharge status: Home / Self Care | Payer: Medicare Other | Source: Ambulatory Visit

## 2014-11-01 DIAGNOSIS — Z1231 Encounter for screening mammogram for malignant neoplasm of breast: Secondary | ICD-10-CM

## 2014-12-25 ENCOUNTER — Other Ambulatory Visit (INDEPENDENT_AMBULATORY_CARE_PROVIDER_SITE_OTHER): Payer: Medicare Other | Admitting: *Deleted

## 2014-12-25 DIAGNOSIS — I429 Cardiomyopathy, unspecified: Secondary | ICD-10-CM

## 2014-12-25 DIAGNOSIS — R7989 Other specified abnormal findings of blood chemistry: Secondary | ICD-10-CM

## 2014-12-25 LAB — LIPID PANEL
Cholesterol: 153 mg/dL (ref 0–200)
HDL: 39.4 mg/dL (ref >39.00)
NONHDL: 113.6
TRIGLYCERIDES: 204 mg/dL — AB (ref 0.0–149.0)
Total CHOL/HDL Ratio: 4
VLDL: 40.8 mg/dL — ABNORMAL HIGH (ref 0.0–40.0)

## 2014-12-25 LAB — HEPATIC FUNCTION PANEL
ALBUMIN: 4.3 g/dL (ref 3.5–5.2)
ALT: 38 U/L — AB (ref 0–35)
AST: 32 U/L (ref 0–37)
Alkaline Phosphatase: 131 U/L — ABNORMAL HIGH (ref 39–117)
BILIRUBIN DIRECT: 0.1 mg/dL (ref 0.0–0.3)
Total Bilirubin: 0.4 mg/dL (ref 0.2–1.2)
Total Protein: 7.4 g/dL (ref 6.0–8.3)

## 2014-12-25 LAB — LDL CHOLESTEROL, DIRECT: Direct LDL: 73 mg/dL

## 2014-12-27 ENCOUNTER — Telehealth: Payer: Self-pay | Admitting: Cardiology

## 2014-12-27 DIAGNOSIS — R945 Abnormal results of liver function studies: Principal | ICD-10-CM

## 2014-12-27 DIAGNOSIS — R7989 Other specified abnormal findings of blood chemistry: Secondary | ICD-10-CM

## 2014-12-27 NOTE — Telephone Encounter (Signed)
Patient called back and given lab results. Patient verbalized understanding.

## 2014-12-27 NOTE — Telephone Encounter (Signed)
Follow Up  Pt called states that she is returning the call for results. Please call

## 2014-12-27 NOTE — Telephone Encounter (Signed)
Left message to call back. Here is her lab results. Per Dr. Shirlee Latch, Good LDL. TGs high, watch diet. Slight elevation in LFTs. Would repeat LFTs in 1 month to make sure not uptrending.

## 2015-01-11 ENCOUNTER — Emergency Department (HOSPITAL_COMMUNITY): Payer: Medicare Other

## 2015-01-11 ENCOUNTER — Encounter (HOSPITAL_COMMUNITY): Payer: Self-pay | Admitting: *Deleted

## 2015-01-11 ENCOUNTER — Emergency Department (HOSPITAL_COMMUNITY)
Admission: EM | Admit: 2015-01-11 | Discharge: 2015-01-12 | Disposition: A | Discharge status: Home / Self Care | Payer: Medicare Other | Attending: Emergency Medicine | Admitting: Emergency Medicine

## 2015-01-11 DIAGNOSIS — Z87828 Personal history of other (healed) physical injury and trauma: Secondary | ICD-10-CM | POA: Insufficient documentation

## 2015-01-11 DIAGNOSIS — Q210 Ventricular septal defect: Secondary | ICD-10-CM | POA: Insufficient documentation

## 2015-01-11 DIAGNOSIS — E119 Type 2 diabetes mellitus without complications: Secondary | ICD-10-CM | POA: Insufficient documentation

## 2015-01-11 DIAGNOSIS — Z79899 Other long term (current) drug therapy: Secondary | ICD-10-CM | POA: Diagnosis not present

## 2015-01-11 DIAGNOSIS — Y929 Unspecified place or not applicable: Secondary | ICD-10-CM | POA: Insufficient documentation

## 2015-01-11 DIAGNOSIS — Z7982 Long term (current) use of aspirin: Secondary | ICD-10-CM | POA: Diagnosis not present

## 2015-01-11 DIAGNOSIS — I10 Essential (primary) hypertension: Secondary | ICD-10-CM | POA: Insufficient documentation

## 2015-01-11 DIAGNOSIS — R011 Cardiac murmur, unspecified: Secondary | ICD-10-CM | POA: Diagnosis not present

## 2015-01-11 DIAGNOSIS — Y9389 Activity, other specified: Secondary | ICD-10-CM | POA: Diagnosis not present

## 2015-01-11 DIAGNOSIS — S79919A Unspecified injury of unspecified hip, initial encounter: Secondary | ICD-10-CM

## 2015-01-11 DIAGNOSIS — S79911A Unspecified injury of right hip, initial encounter: Secondary | ICD-10-CM | POA: Diagnosis present

## 2015-01-11 DIAGNOSIS — X58XXXA Exposure to other specified factors, initial encounter: Secondary | ICD-10-CM | POA: Insufficient documentation

## 2015-01-11 DIAGNOSIS — Y999 Unspecified external cause status: Secondary | ICD-10-CM | POA: Insufficient documentation

## 2015-01-11 DIAGNOSIS — Z720 Tobacco use: Secondary | ICD-10-CM | POA: Diagnosis not present

## 2015-01-11 DIAGNOSIS — S73004A Unspecified dislocation of right hip, initial encounter: Secondary | ICD-10-CM

## 2015-01-11 DIAGNOSIS — Z8781 Personal history of (healed) traumatic fracture: Secondary | ICD-10-CM | POA: Diagnosis not present

## 2015-01-11 DIAGNOSIS — T84020A Dislocation of internal right hip prosthesis, initial encounter: Secondary | ICD-10-CM | POA: Diagnosis not present

## 2015-01-11 DIAGNOSIS — Z9889 Other specified postprocedural states: Secondary | ICD-10-CM

## 2015-01-11 MED ORDER — PROPOFOL 10 MG/ML IV BOLUS
1.0000 mg/kg | Freq: Once | INTRAVENOUS | Status: AC
  Administered 2015-01-11: 200 mg via INTRAVENOUS
  Filled 2015-01-11: qty 20

## 2015-01-11 MED ORDER — HYDROMORPHONE HCL 1 MG/ML IJ SOLN
0.5000 mg | Freq: Once | INTRAMUSCULAR | Status: AC
  Administered 2015-01-11: 0.5 mg via INTRAVENOUS
  Filled 2015-01-11: qty 1

## 2015-01-11 MED ORDER — HYDROMORPHONE HCL 1 MG/ML IJ SOLN
1.0000 mg | Freq: Once | INTRAMUSCULAR | Status: AC
  Administered 2015-01-11: 1 mg via INTRAVENOUS
  Filled 2015-01-11: qty 1

## 2015-01-11 MED ORDER — PROPOFOL 10 MG/ML IV BOLUS
INTRAVENOUS | Status: AC | PRN
  Administered 2015-01-11: 100 mg via INTRAVENOUS

## 2015-01-11 NOTE — ED Notes (Signed)
Pt in c/o possible hip dislocation, states she has had a right hip revision and has dislocated it in the past, pt was sitting with her legs crossed and stood up and felt the hip pop, pt has been unable to bear any weight since that time

## 2015-01-11 NOTE — Consult Note (Deleted)
Reason for Consult:Hip Pain Referring Physician: ED  [name] is an 59 y.o. female.  HPI: She presents with deformity of hip, she is status post hip replacement with revision done at [facility] 27mo prior. She had been doing well but reports sitting with legs crossed in chair earlier today, uncrossed legs to stand and had severe pain in hip and deformity, was unable to stand or ambulate, obtained assistance to get into car and husband drove her here to ED. Reports severe 9/10 pain in hip, sharp stabbing and aching, no numbness or tingling, no pain down the leg, she is very teary eyed and upset, expresses a lot of anxiety.   Past Medical History  Diagnosis Date  . Hypertension   . Ventricular septal defect   . Paroxysmal ventricular tachycardia   . Diabetes mellitus   . Broken femur     right    Past Surgical History  Procedure Laterality Date  . Shoulder arthroscopy      right  . Hernia repair    . Joint replacement    . Abdominal hysterectomy    . Exam under anesthesia with manipulation of hip Right 06/02/2014    dr [name]  . Total hip arthroplasty Right 05/10/2014    [facility]      by dr [name]  . Hip closed reduction Right 06/02/2014    Procedure: CLOSED MANIPULATION HIP;  Surgeon: Marissa Ochs, MD;  Location: [facility] OR;  Service: Orthopedics;  Laterality: Right;    Family History  Problem Relation Age of Onset  . Stroke Father 29    deceased  . Other Mother     alive  . Heart attack Other     multiple uncles have died with myocardial infarction    Social History:  reports that she has been smoking Cigarettes.  She has never used smokeless tobacco. She reports that she does not drink alcohol or use illicit drugs.  Allergies: No Known Allergies  Medications:  No current facility-administered medications on file prior to encounter.   Current Outpatient Prescriptions on File Prior to Encounter  Medication Sig Dispense Refill  . aspirin 81  MG tablet Take 81 mg by mouth daily.      [name] atorvastatin (LIPITOR) 10 MG tablet Take 1 tablet (10 mg total) by mouth daily. 30 tablet 3  . buPROPion (WELLBUTRIN XL) 300 MG 24 hr tablet Take 300 mg by mouth daily.      . carvedilol (COREG) 3.125 MG tablet Take 1 tablet (3.125 mg total) by mouth 2 (two) times daily. 180 tablet 3  . fluvoxaMINE (LUVOX) 100 MG tablet Take 300 mg by mouth at bedtime.     . gabapentin (NEURONTIN) 300 MG capsule Take 600 mg by mouth at bedtime.     [name] levothyroxine (SYNTHROID, LEVOTHROID) 50 MCG tablet Take 50 mcg by mouth daily before breakfast.    . losartan-hydrochlorothiazide (HYZAAR) 100-12.5 MG per tablet Take 1 tablet by mouth daily. 30 tablet 9  . metFORMIN (GLUCOPHAGE) 1000 MG tablet Take 1,000 mg by mouth daily.      . Multiple Vitamin (MULTIVITAMIN) tablet Take 1 tablet by mouth daily.      [name] omeprazole (PRILOSEC OTC) 20 MG tablet Take 20 mg by mouth daily.      [name] oxyCODONE-acetaminophen (PERCOCET) 5-325 MG per tablet Take 1 tablet by mouth every 6 (six) hours as needed.     [name] QUEtiapine (SEROQUEL) 300 MG tablet Take 600 mg by mouth at bedtime.      [name]  traZODone (DESYREL) 100 MG tablet Take 100 mg by mouth at bedtime.        Review of Systems  Constitutional: Negative for fever, chills and weight loss.  HENT: Negative for ear discharge and nosebleeds.   Eyes: Negative for discharge and redness.  Respiratory: Negative for cough, hemoptysis, sputum production, shortness of breath, wheezing and stridor.   Cardiovascular: Negative for leg swelling.  Gastrointestinal: Negative for nausea, vomiting and abdominal pain.  Musculoskeletal: Positive for joint pain.  Skin: Negative for rash.  Neurological: Negative for weakness.   Blood pressure 120/74, pulse 89, temperature 97.7 F (36.5 C), temperature source Oral, resp. rate 16, height  (1.626 m), weight 81.647 kg (180 lb), SpO2 97 %. Physical Exam  Constitutional: She is oriented to person, place, and  time. She appears well-developed and well-nourished. She appears distressed.  HENT:  Head: Normocephalic and atraumatic.  Right Ear: External ear normal.  Left Ear: External ear normal.  Nose: Nose normal.  Mouth/Throat: No oropharyngeal exudate.  Eyes: EOM are normal. Pupils are equal, round, and reactive to light. Right eye exhibits no discharge. Left eye exhibits no discharge.  Respiratory: Effort normal and breath sounds normal. No stridor. No respiratory distress.  GI: She exhibits no distension. There is no tenderness.  Musculoskeletal: She exhibits tenderness.  Pt has shortened and internally rotated LE, pain about hip, unable to bear weight or straighten leg, distal compartments soft and nontender, 2+ DPP, NVI  Neurological: She is alert and oriented to person, place, and time.  Skin: Skin is warm and dry. No rash noted. No erythema.  Psychiatric: She has a normal mood and affect. Her behavior is normal. Judgment and thought content normal.    Assessment/Plan: S/P Hip replacement with revision performed 5mo ago at [name] doing well until spontaneous dislocation 4hrs ago  -Will attempt to reduce in ED, if fail will proceed to OR for reduction  -Pt denies use of anticoagulants   -Will f/u with surgeon at [name] - [facility] [place] MD  01/11/2015, 11:06 PM

## 2015-01-11 NOTE — Progress Notes (Signed)
Once conscious sedation was administered, I internally rotated and flexed R hip and pulled gentle traction and felt an obvious clunk. Leg lengths were noted to be equal. Patient NV intact with significantly improved pain. Post-reduction films reveal reduced hip and no fracture along femur. Knee immobilizer placed by me and patient educated on hip precautions and will follow-up with her surgeon next week.

## 2015-01-11 NOTE — Consult Note (Signed)
[name] is an 59 y.o. female.  HPI: She presents with deformity of hip, she is status post hip replacement with revision done at [place] 36mo prior. She had been doing well but reports sitting with legs crossed in chair earlier today, uncrossed legs to stand and had severe pain in hip and deformity, was unable to stand or ambulate, obtained assistance to get into car and husband drove her here to ED. Reports severe 9/10 pain in hip, sharp stabbing and aching, no numbness or tingling, no pain down the leg, she is very teary eyed and upset, expresses a lot of anxiety.   Past Medical History  Diagnosis Date  . Hypertension   . Ventricular septal defect   . Paroxysmal ventricular tachycardia   . Diabetes mellitus   . Broken femur     right    Past Surgical History  Procedure Laterality Date  . Shoulder arthroscopy      right  . Hernia repair    . Joint replacement    . Abdominal hysterectomy    . Exam under anesthesia with manipulation of hip Right 06/02/2014    dr Jerl Santos  . Total hip arthroplasty Right 05/10/2014    [facility] by dr Carola Rhine  . Hip closed reduction Right 06/02/2014    Procedure: CLOSED MANIPULATION HIP; Surgeon: Velna Ochs, MD; Location: [facility] OR; Service: Orthopedics; Laterality: Right;    Family History  Problem Relation Age of Onset  . Stroke Father 37    deceased  . Other Mother     alive  . Heart attack Other     multiple uncles have died with myocardial infarction    Social History:  reports that she has been smoking Cigarettes. She has never used smokeless tobacco. She reports that she does not drink alcohol or use illicit drugs.  Allergies: No Known Allergies  Medications:  No current facility-administered medications on file prior to encounter.   Current Outpatient Prescriptions on File Prior to Encounter   Medication Sig Dispense Refill  . aspirin 81 MG tablet Take 81 mg by mouth daily.     [name] atorvastatin (LIPITOR) 10 MG tablet Take 1 tablet (10 mg total) by mouth daily. 30 tablet 3  . buPROPion (WELLBUTRIN XL) 300 MG 24 hr tablet Take 300 mg by mouth daily.     . carvedilol (COREG) 3.125 MG tablet Take 1 tablet (3.125 mg total) by mouth 2 (two) times daily. 180 tablet 3  . fluvoxaMINE (LUVOX) 100 MG tablet Take 300 mg by mouth at bedtime.     . gabapentin (NEURONTIN) 300 MG capsule Take 600 mg by mouth at bedtime.     [name] levothyroxine (SYNTHROID, LEVOTHROID) 50 MCG tablet Take 50 mcg by mouth daily before breakfast.    . losartan-hydrochlorothiazide (HYZAAR) 100-12.5 MG per tablet Take 1 tablet by mouth daily. 30 tablet 9  . metFORMIN (GLUCOPHAGE) 1000 MG tablet Take 1,000 mg by mouth daily.     . Multiple Vitamin (MULTIVITAMIN) tablet Take 1 tablet by mouth daily.     [name] omeprazole (PRILOSEC OTC) 20 MG tablet Take 20 mg by mouth daily.     [name] oxyCODONE-acetaminophen (PERCOCET) 5-325 MG per tablet Take 1 tablet by mouth every 6 (six) hours as needed.     [name] QUEtiapine (SEROQUEL) 300 MG tablet Take 600 mg by mouth at bedtime.     . traZODone (DESYREL) 100 MG tablet Take 100 mg by mouth at bedtime.  Review of Systems  Constitutional: Negative for fever, chills and weight loss.  HENT: Negative for ear discharge and nosebleeds.  Eyes: Negative for discharge and redness.  Respiratory: Negative for cough, hemoptysis, sputum production, shortness of breath, wheezing and stridor.  Cardiovascular: Negative for leg swelling.  Gastrointestinal: Negative for nausea, vomiting and abdominal pain.  Musculoskeletal: Positive for joint pain.  Skin: Negative for rash.  Neurological: Negative for weakness.   Blood pressure 120/74, pulse 89, temperature 97.7 F (36.5 C), temperature source Oral, resp. rate 16, height   (1.626 m), weight 81.647 kg (180 lb), SpO2 97 %. Physical Exam  Constitutional: She is oriented to person, place, and time. She appears well-developed and well-nourished. She appears distressed.  HENT:  Head: Normocephalic and atraumatic.  Right Ear: External ear normal.  Left Ear: External ear normal.  Nose: Nose normal.  Mouth/Throat: No oropharyngeal exudate.  Eyes: EOM are normal. Pupils are equal, round, and reactive to light. Right eye exhibits no discharge. Left eye exhibits no discharge.  Respiratory: Effort normal and breath sounds normal. No stridor. No respiratory distress.  GI: She exhibits no distension. There is no tenderness.  Musculoskeletal: She exhibits tenderness.  Pt has shortened and minimally rotated R LE, pain about hip, unable to bear weight or straighten leg, distal compartments soft and nontender, NVI, toes are warm and cap refill < 2 sec Neurological: She is alert and oriented to person, place, and time.  Skin: Skin is warm and dry. No rash noted. No erythema.  Psychiatric: She has a normal mood and affect. Her behavior is normal. Judgment and thought content normal.    Assessment/Plan: S/P Hip replacement with revision performed 43mo ago at [facility] doing well until spontaneous dislocation 4hrs ago -Will attempt to reduce in ED, if fail will proceed to OR for reduction, patient educated on risks including neurovascular injury, and possible inability to reduce closed in the ED -Pt denies use of anticoagulants  -Will f/u with surgeon at [facility] within next week  Estill Bamberg MD  01/11/2015, 11:06 PM

## 2015-01-11 NOTE — ED Provider Notes (Signed)
CSN: [id number]     Arrival date & time 01/11/15  2057 History   First MD Initiated Contact with Patient 01/11/15 2106     Chief Complaint  Patient presents with  . Hip Injury     (Consider location/radiation/quality/duration/timing/severity/associated sxs/prior Treatment) Patient is a 59 y.o. female presenting with hip pain. The history is provided by the patient.  Hip Pain This is a new problem. The current episode started 1 to 2 hours ago. The problem occurs constantly. The problem has not changed since onset.Pertinent negatives include no chest pain, no abdominal pain, no headaches and no shortness of breath. Nothing aggravates the symptoms. Nothing relieves the symptoms. She has tried nothing for the symptoms. The treatment provided no relief.    Past Medical History  Diagnosis Date  . Hypertension   . Ventricular septal defect   . Paroxysmal ventricular tachycardia   . Diabetes mellitus   . Broken femur     right   Past Surgical History  Procedure Laterality Date  . Shoulder arthroscopy      right  . Hernia repair    . Joint replacement    . Abdominal hysterectomy    . Exam under anesthesia with manipulation of hip Right 06/02/2014    dr Jerl Santos  . Total hip arthroplasty Right 05/10/2014    [facility]      by dr Carola Rhine  . Hip closed reduction Right 06/02/2014    Procedure: CLOSED MANIPULATION HIP;  Surgeon: Velna Ochs, MD;  Location: [facility] OR;  Service: Orthopedics;  Laterality: Right;   Family History  Problem Relation Age of Onset  . Stroke Father 49    deceased  . Other Mother     alive  . Heart attack Other     multiple uncles have died with myocardial infarction   History  Substance Use Topics  . Smoking status: Current Every Day Smoker    Types: Cigarettes  . Smokeless tobacco: Never Used  . Alcohol Use: No   OB History    No data available     Review of Systems  Constitutional: Negative for fever and fatigue.  HENT: Negative  for congestion and drooling.   Eyes: Negative for pain.  Respiratory: Negative for cough and shortness of breath.   Cardiovascular: Negative for chest pain.  Gastrointestinal: Negative for nausea, vomiting, abdominal pain and diarrhea.  Genitourinary: Negative for dysuria and hematuria.  Musculoskeletal: Negative for back pain, gait problem and neck pain.  Skin: Negative for color change.  Neurological: Negative for dizziness and headaches.  Hematological: Negative for adenopathy.  Psychiatric/Behavioral: Negative for behavioral problems.  All other systems reviewed and are negative.     Allergies  Review of patient's allergies indicates no known allergies.  Home Medications   Prior to Admission medications   Medication Sig Start Date End Date Taking? Authorizing Provider  aspirin 81 MG tablet Take 81 mg by mouth daily.      Historical Provider, MD  atorvastatin (LIPITOR) 10 MG tablet Take 1 tablet (10 mg total) by mouth daily. 10/24/14   Laurey Morale, MD  buPROPion (WELLBUTRIN XL) 300 MG 24 hr tablet Take 300 mg by mouth daily.      Historical Provider, MD  carvedilol (COREG) 3.125 MG tablet Take 1 tablet (3.125 mg total) by mouth 2 (two) times daily. 10/17/14   Laurey Morale, MD  fluvoxaMINE (LUVOX) 100 MG tablet Take 300 mg by mouth at bedtime.     Historical Provider, MD  gabapentin (NEURONTIN) 300 MG capsule Take 600 mg by mouth at bedtime.     Historical Provider, MD  levothyroxine (SYNTHROID, LEVOTHROID) 50 MCG tablet Take 50 mcg by mouth daily before breakfast.    Historical Provider, MD  losartan-hydrochlorothiazide (HYZAAR) 100-12.5 MG per tablet Take 1 tablet by mouth daily. 02/02/12   Herby Abraham, MD  metFORMIN (GLUCOPHAGE) 1000 MG tablet Take 1,000 mg by mouth daily.      Historical Provider, MD  Multiple Vitamin (MULTIVITAMIN) tablet Take 1 tablet by mouth daily.      Historical Provider, MD  omeprazole (PRILOSEC OTC) 20 MG tablet Take 20 mg by mouth daily.       Historical Provider, MD  oxyCODONE-acetaminophen (PERCOCET) 5-325 MG per tablet Take 1 tablet by mouth every 6 (six) hours as needed.     Historical Provider, MD  QUEtiapine (SEROQUEL) 300 MG tablet Take 600 mg by mouth at bedtime.      Historical Provider, MD  traZODone (DESYREL) 100 MG tablet Take 100 mg by mouth at bedtime.      Historical Provider, MD   BP 116/69 mmHg  Pulse 91  Temp(Src) 97.7 F (36.5 C) (Oral)  Resp 16  Ht 5\' 4"  (1.626 m)  Wt 180 lb (81.647 kg)  BMI 30.88 kg/m2  SpO2 94% Physical Exam  Constitutional: She is oriented to person, place, and time. She appears well-developed and well-nourished.  HENT:  Head: Normocephalic.  Mouth/Throat: Oropharynx is clear and moist. No oropharyngeal exudate.  Eyes: Conjunctivae and EOM are normal. Pupils are equal, round, and reactive to light.  Neck: Normal range of motion. Neck supple.  Cardiovascular: Normal rate, regular rhythm and intact distal pulses.  Exam reveals no gallop and no friction rub.   Murmur heard. Pulmonary/Chest: Effort normal and breath sounds normal. No respiratory distress. She has no wheezes.  Abdominal: Soft. Bowel sounds are normal. There is no tenderness. There is no rebound and no guarding.  Musculoskeletal: Normal range of motion. She exhibits no edema or tenderness.  2+ DP pulses and bilateral lower extremities.  Normal sensation in bilateral lower extremities.  Tenderness to palpation of the right lateral hip.   Neurological: She is alert and oriented to person, place, and time.  Skin: Skin is warm and dry.  Psychiatric: She has a normal mood and affect. Her behavior is normal.  Nursing note and vitals reviewed.   ED Course  Procedural sedation Date/Time: 01/12/2015 12:44 AM Performed by: Purvis Sheffield Authorized by: Purvis Sheffield Consent: Verbal consent obtained. Written consent obtained. Risks and benefits: risks, benefits and alternatives were discussed Consent given by:  patient Patient understanding: patient states understanding of the procedure being performed Patient consent: the patient's understanding of the procedure matches consent given Procedure consent: procedure consent matches procedure scheduled Relevant documents: relevant documents present and verified Test results: test results available and properly labeled Site marked: the operative site was marked Imaging studies: imaging studies available Required items: required blood products, implants, devices, and special equipment available Patient identity confirmed: verbally with patient, arm band, provided demographic data and hospital-assigned identification number Time out: Immediately prior to procedure a "time out" was called to verify the correct patient, procedure, equipment, support staff and site/side marked as required. Preparation: Patient was prepped and draped in the usual sterile fashion. Local anesthesia used: no Patient sedated: yes Sedatives: propofol Sedation start date/time: 01/11/2015 11:23 PM Sedation end date/time: 01/11/2015 11:30 PM Vitals: Vital signs were monitored during sedation. Patient tolerance: Patient tolerated the  procedure well with no immediate complications   (including critical care time) Labs Review Labs Reviewed - No data to display  Imaging Review Dg Hip Port Unilat With Pelvis 1v Right  01/12/2015   CLINICAL DATA:  Status post reduction of right hip prosthesis. Initial encounter.  EXAM: RIGHT HIP (WITH PELVIS) 1 VIEW PORTABLE  COMPARISON:  Right hip radiographs performed earlier today at 10:22 p.m.  FINDINGS: There has been successful reduction of the patient's right hip prosthesis. No new fractures are seen. Chronic fractures and loosening involving the proximal right femur are unchanged in appearance, with associated cerclage wires. The left hip prosthesis is grossly unremarkable in appearance, though incompletely imaged.  Degenerative change is noted  along the lower lumbar spine. The visualized bowel gas pattern is grossly unremarkable. Postoperative change is noted at the upper pelvis.  IMPRESSION: Successful reduction of right hip prosthesis.  No new fracture seen.   Electronically Signed   By: Roanna Raider M.D.   On: 01/12/2015 00:12   Dg Hip Port Unilat With Pelvis 1v Right  01/11/2015   CLINICAL DATA:  Right hip popped when patient crossed legs. Initial encounter.  EXAM: RIGHT HIP (WITH PELVIS) 1 VIEW PORTABLE  COMPARISON:  Right femur radiographs performed 06/02/2014  FINDINGS: There is superior dislocation of the ball of the patient's right hip prosthesis. Underlying chronic fractures involving the proximal right femur, with associated proximal loosening, are grossly unchanged. New cerclage wires are seen. The left hip prosthesis is grossly unremarkable in appearance, though incompletely imaged. No new fractures are identified.  Postoperative change is seen about the upper pelvis. The visualized bowel gas pattern is grossly unremarkable.  IMPRESSION: Superior dislocation of the ball of the patient's right hip prosthesis. No new fracture seen. Underlying chronic persistent fractures involving the proximal right femur, with associated proximal loosening, are grossly unchanged, with associated new cerclage wires.   Electronically Signed   By: Roanna Raider M.D.   On: 01/11/2015 23:23     EKG Interpretation None      MDM   Final diagnoses:  S/P closed reduction of dislocated total hip prothesis  Hip dislocation, right, initial encounter    10:10 PM 59 y.o. female w hx of HTN, VSD, DM who presents with suspected right hip dislocation. The patient states that around 7:38 PM this evening she uncrossed her legs and felt her right hip dislocate. She has had significant pain since that time. She is neurovascularly intact on exam. She has a complicated surgical history including initial hip replacement in 2014 with revision in 2015 and then  subsequent dislocation and femur fracture requiring another revision. She follows with [facility] and has also had surgeries done at [facility]. Vital signs stable here. We'll get plain film and touch base with orthopedics.  Case discussed with Dr. Reva Bores who will aid me in the hip reduction.  12:36 AM: Hip reduced successfully by Dr. Reva Bores while I performed conscious sedation. Pt appears well. Placed in knee immobilizer. I have discussed the diagnosis/risks/treatment options with the patient and believe the pt to be eligible for discharge home to follow-up with her surgeon next week. We also discussed returning to the ED immediately if new or worsening sx occur. We discussed the sx which are most concerning (e.g., recurrent dislocation, worsening pain) that necessitate immediate return. Medications administered to the patient during their visit and any new prescriptions provided to the patient are listed below.  Medications given during this visit Medications  HYDROmorphone (DILAUDID) injection 1  mg (1 mg Intravenous Given 01/11/15 2144)  HYDROmorphone (DILAUDID) injection 0.5 mg (0.5 mg Intravenous Given 01/11/15 2216)  propofol (DIPRIVAN) 10 mg/mL bolus/IV push 81.6 mg (0 mg/kg  81.6 kg Intravenous Stopped 01/11/15 2324)  propofol (DIPRIVAN) 10 mg/mL bolus/IV push ( Intravenous Not Given 01/11/15 2337)    New Prescriptions   No medications on file     Purvis Sheffield, MD 01/12/15 1125

## 2015-02-03 ENCOUNTER — Other Ambulatory Visit: Payer: Medicare Other

## 2015-06-18 ENCOUNTER — Other Ambulatory Visit: Payer: Self-pay | Admitting: Nurse Practitioner

## 2015-06-18 ENCOUNTER — Ambulatory Visit
Admission: RE | Admit: 2015-06-18 | Discharge: 2015-06-18 | Disposition: A | Discharge status: Home / Self Care | Payer: Medicare Other | Source: Ambulatory Visit | Attending: Nurse Practitioner | Admitting: Nurse Practitioner

## 2015-06-18 ENCOUNTER — Ambulatory Visit (INDEPENDENT_AMBULATORY_CARE_PROVIDER_SITE_OTHER): Payer: Medicare Other | Admitting: Nurse Practitioner

## 2015-06-18 ENCOUNTER — Encounter: Payer: Self-pay | Admitting: Nurse Practitioner

## 2015-06-18 VITALS — BP 138/80 | HR 91 | Ht 64.0 in | Wt 196.8 lb

## 2015-06-18 DIAGNOSIS — R0789 Other chest pain: Secondary | ICD-10-CM | POA: Diagnosis not present

## 2015-06-18 LAB — CBC
HCT: 38.1 % (ref 36.0–46.0)
Hemoglobin: 13.2 g/dL (ref 12.0–15.0)
MCH: 32.1 pg (ref 26.0–34.0)
MCHC: 34.6 g/dL (ref 30.0–36.0)
MCV: 92.7 fL (ref 78.0–100.0)
MPV: 8.4 fL — ABNORMAL LOW (ref 8.6–12.4)
Platelets: 433 10*3/uL — ABNORMAL HIGH (ref 150–400)
RBC: 4.11 MIL/uL (ref 3.87–5.11)
RDW: 13.7 % (ref 11.5–15.5)
WBC: 8.4 10*3/uL (ref 4.0–10.5)

## 2015-06-18 LAB — BASIC METABOLIC PANEL
BUN: 16 mg/dL (ref 7–25)
CO2: 25 mmol/L (ref 20–31)
Calcium: 8.8 mg/dL (ref 8.6–10.4)
Chloride: 95 mmol/L — ABNORMAL LOW (ref 98–110)
Creat: 0.75 mg/dL (ref 0.50–0.99)
Glucose, Bld: 142 mg/dL — ABNORMAL HIGH (ref 65–99)
Potassium: 4.1 mmol/L (ref 3.5–5.3)
Sodium: 130 mmol/L — ABNORMAL LOW (ref 135–146)

## 2015-06-18 MED ORDER — NITROGLYCERIN 0.4 MG SL SUBL: 0.4000 mg | SUBLINGUAL_TABLET | SUBLINGUAL | Status: DC | PRN

## 2015-06-18 NOTE — Progress Notes (Signed)
CARDIOLOGY OFFICE NOTE  Date:  06/18/2015    [name] Date of Birth: Dec 11, 1955 Medical Record #[id number]  PCP:  [name], MD  Cardiologist:  Shirlee Latch    Chief Complaint  Patient presents with  . FU for cardiomyopathy    Follow up visit - seen for Dr. 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 Seen for Dr. Shirlee Latch. She has a history of membranous VSD (small), cardiomyopathy (probably due to HTN, EF improved on most recent echo), and prior VT s/p ablation.   In 2/15, she had a Lexiscan Cardiolite showing EF 61% and a fixed small apical defect with no ischemia. Repeat echo in 2/15 showed EF 45-50% with small stable perimembranous VSD and and normal RV.   Last seen in March - had had a prior fall with right femur fracture in 11/15. This was surgically repaired. She was still smoking. She was no longer on Coreg.   Comes back today. Here alone. Says she is ready to "start taking care of herself". Having more chest pain. Now 1 to 2 spells per month that is progressing to once a week- had 3 spells this past Monday. Lots of stress. Her pain is located under left breast or midsternal. No radiation. Not really short of breath. She is worried now that something is wrong. Will last few minutes. No aggravating factors. No regular exercise due to her orthopedic injuries. Pretty sedentary. Weight is up. Multiple risk factors.   PMH: 1. GERD 2. HTN 3. Membranous VSD: Small.  4. Scoliosis 5. VT: s/p VT ablation in the early 1990s in [place].  6. Cardiomyopathy: Possibly related to HTN. This improved with control of BP. Last echo in 3/13 showed EF 50-55%, small perimembranous VSD, mild LVH, mild AI, PA systolic pressure 32 mmHg. Echo (2/15) with EF 45-50%, stable small perimembranous VSD, normal RV size and systolic function, mild aortic insufficiency.  7. Type II diabetes 8. Atypical  chest pain: Lexiscan Cardiolite (2/15) with fixed small apical defect, no ischemia (low risk).  9. Bilateral THR   Past Medical History  Diagnosis Date  . Hypertension   . Ventricular septal defect   . Paroxysmal ventricular tachycardia   . Diabetes mellitus   . Broken femur     right    Past Surgical History  Procedure Laterality Date  . Shoulder arthroscopy      right  . Hernia repair    . Joint replacement    . Abdominal hysterectomy    . Exam under anesthesia with manipulation of hip Right 06/02/2014    dr Jerl Santos  . Total hip arthroplasty Right 05/10/2014    [place]      by dr Carola Rhine  . Hip closed reduction Right 06/02/2014    Procedure: CLOSED MANIPULATION HIP;  Surgeon: Velna Ochs, MD;  Location: [facility] OR;  Service: Orthopedics;  Laterality: Right;     Medications: Current Outpatient Prescriptions  Medication Sig Dispense Refill  . amoxicillin (AMOXIL) 500 MG capsule TK 4 CS PO 1 HOUR PRIOR TO DENTAL APPOINTMENT  0  . aspirin 81 MG tablet Take 81 mg by mouth daily.      [name] atorvastatin (LIPITOR) 10 MG tablet Take 1 tablet (10 mg total) by mouth daily. 30 tablet 3  . buPROPion (WELLBUTRIN XL) 300 MG 24 hr tablet Take 300 mg by mouth daily.      [name]  carvedilol (COREG) 3.125 MG tablet Take 1 tablet (3.125 mg total) by mouth 2 (two) times daily. 180 tablet 3  . celecoxib (CELEBREX) 200 MG capsule TK 1 C PO BID PRN  5  . fluvoxaMINE (LUVOX) 100 MG tablet Take 300 mg by mouth at bedtime.     . gabapentin (NEURONTIN) 300 MG capsule Take 600 mg by mouth at bedtime.     [name] levothyroxine (SYNTHROID, LEVOTHROID) 50 MCG tablet Take 50 mcg by mouth daily before breakfast.    . losartan-hydrochlorothiazide (HYZAAR) 100-12.5 MG per tablet Take 1 tablet by mouth daily. 30 tablet 9  . metFORMIN (GLUCOPHAGE) 1000 MG tablet Take 1,000 mg by mouth daily.      . Multiple Vitamin (MULTIVITAMIN) tablet Take 1 tablet by mouth daily.      [name] omeprazole (PRILOSEC OTC) 20 MG  tablet Take 20 mg by mouth daily.      [name] oxyCODONE-acetaminophen (PERCOCET) 5-325 MG per tablet Take 1 tablet by mouth every 6 (six) hours as needed.     [name] oxyCODONE-acetaminophen (PERCOCET) 7.5-325 MG tablet Take 1 tablet by mouth every 4 (four) hours as needed for moderate pain.   0  . QUEtiapine (SEROQUEL) 300 MG tablet Take 600 mg by mouth at bedtime.      . traZODone (DESYREL) 100 MG tablet Take 100 mg by mouth at bedtime.      . nitroGLYCERIN (NITROSTAT) 0.4 MG SL tablet Place 1 tablet (0.4 mg total) under the tongue every 5 (five) minutes as needed for chest pain. 25 tablet 3   No current facility-administered medications for this visit.    Allergies: No Known Allergies  Social History: The patient  reports that she has been smoking Cigarettes.  She has never used smokeless tobacco. She reports that she does not drink alcohol or use illicit drugs.   Family History: The patient's family history includes Heart attack in her other; Other in her mother; Stroke (age of onset: 57) in her father.   Review of Systems: Please see the history of present illness.   Otherwise, the review of systems is positive for none.   All other systems are reviewed and negative.   Physical Exam: VS:  BP 138/80 mmHg  Pulse 91  Ht 5\' 4"  (1.626 m)  Wt 196 lb 12.8 oz (89.268 kg)  BMI 33.76 kg/m2  SpO2 97% .  BMI Body mass index is 33.76 kg/(m^2).  Wt Readings from Last 3 Encounters:  06/18/15 196 lb 12.8 oz (89.268 kg)  01/11/15 180 lb (81.647 kg)  10/17/14 184 lb (83.462 kg)    General: Pleasant. Seems a little anxious but in no acute distress. Smells of tobacco. She looks older than stated age.  HEENT: Normal. Neck: Supple, no JVD, carotid bruits, or masses noted.  Cardiac: Regular rate and rhythm. No murmurs, rubs, or gallops. No edema.  Respiratory:  Lungs are coarse but with normal work of breathing.  GI: Soft and nontender.  MS: No deformity or atrophy. Gait and ROM intact. Skin: Warm and  dry. Color is normal.  Neuro:  Strength and sensation are intact and no gross focal deficits noted.  Psych: Alert, appropriate and with normal affect.   LABORATORY DATA:  EKG:  EKG is ordered today. This demonstrates NSR with nonspecific T wave changes.  Lab Results  Component Value Date   WBC 11.0* 06/02/2014   HGB 9.6* 06/02/2014   HCT 29.2* 06/02/2014   PLT 386 06/02/2014   GLUCOSE 106* 06/02/2014  CHOL 153 12/25/2014   TRIG 204.0* 12/25/2014   HDL 39.40 12/25/2014   LDLDIRECT 73.0 12/25/2014   ALT 38* 12/25/2014   AST 32 12/25/2014   NA 129* 06/02/2014   K 4.3 06/02/2014   CL 92* 06/02/2014   CREATININE 0.56 06/02/2014   BUN 8 06/02/2014   CO2 21 06/02/2014   INR 1.07 06/02/2014    BNP (last 3 results) No results for input(s): BNP in the last 8760 hours.  ProBNP (last 3 results) No results for input(s): PROBNP in the last 8760 hours.   Other Studies Reviewed Today:  Echo Study Conclusions from 10/2014  - Left ventricle: The cavity size was normal. Systolic function was mildly reduced. The estimated ejection fraction was in the range of 45% to 50%. Wall motion was normal; there were no regional wall motion abnormalities. Doppler parameters are consistent with abnormal left ventricular relaxation (grade 1 diastolic dysfunction). - Ventricular septum: There was a small congenital ventricular septal defectin the perimembranous region. - Mitral valve: There was mild regurgitation. - Left atrium: The atrium was mildly dilated.  Impressions:  - Compared to the prior study, there has been no significant interval change.   Assessment/Plan: 1. Chest pain:  Lexiscan Cardiolite was low risk in 2/15 . Her symptoms are progressing with more frequency. Multiple risk factors that include long standing smoking, HTN, HLD, DM and obesity. Will proceed with cardiac cath. The patient understands that risks include but are not limited to stroke (1 in 1000),  death (1 in 1000), kidney failure [usually temporary] (1 in 500), bleeding (1 in 200), allergic reaction [possibly serious] (1 in 200), and agrees to proceed.   2. Smoking: I strongly encouraged her to quit smoking. Not sure she is quite ready to stop.  3. Hyperlipidemia: on statin therapy  4. Perimembranous VSD: Soft murmur noted. This is small and has been known for a long time. Most recent echo from April noted.   5. Cardiomyopathy: Possibly related to HTN. EF 45-50% on most recent echo, this is relatively stable. She remains on ACE and beta blocker therapy  6. HTN: BP controlled on current regimen.   7. H/o VT: s/p ablation. No symptomatic recurrence.   Current medicines are reviewed with the patient today.  The patient does not have concerns regarding medicines other than what has been noted above.  The following changes have been made:  See above.  Labs/ tests ordered today include:    Orders Placed This Encounter  Procedures  . DG Chest 2 View  . Basic metabolic panel  . CBC  . Protime-INR  . APTT  . EKG 12-Lead     Disposition:   Further disposition to follow. Cardiac cath arranged for Monday at 1:30 pm with Dr. Shirlee Latch  Patient is agreeable to this plan and will call if any problems develop in the interim.   Signed: Rosalio Macadamia, RN, ANP-C 06/18/2015 12:16 PM  [facility] [address] [place], [place]  [address] Phone: [phone] Fax: [phone]

## 2015-06-18 NOTE — Patient Instructions (Addendum)
We will be checking the following labs today - BMET, CBC, PT, PTT  Please go to [facility] to [facility] on the first floor for a chest Xray - you may walk in.    Medication Instructions:    Continue with your current medicines.  I have sent in a RX for NTG - Use your NTG under your tongue for recurrent chest pain. May take one tablet every 5 minutes. If you are still having discomfort after 3 tablets in 15 minutes, call 911.    Testing/Procedures To Be Arranged:  N/A  Follow-Up:   Will see how your cath turns out and then decide about follow up.    Other Special Instructions:  Your provider has recommended a cardiac catherization  You are scheduled for a cardiac catheterization on Monday, December 5th at 1:30PM with Dr. Shirlee Latch or associate.  Go to [facility] 2nd Floor Short Stay on Monday, December 5th at 11:30 AM  Enter thru the [employer] entrance A No food or drink after midnight on Sunday. You may take your medications with a sip of water on the day of your procedure.  STOP YOUR METFORMIN AFTER SATURDAYS DOSE  Coronary Angiogram A coronary angiogram, also called coronary angiography, is an X-ray procedure used to look at the arteries in the heart. In this procedure, a dye (contrast dye) is injected through a long, hollow tube (catheter). The catheter is about the size of a piece of cooked spaghetti and is inserted through your groin, wrist, or arm. The dye is injected into each artery, and X-rays are then taken to show if there is a blockage in the arteries of your heart.  LET [facility] CARE PROVIDER KNOW ABOUT: Any allergies you have, including allergies to shellfish or contrast dye.  All medicines you are taking, including vitamins, herbs, eye drops, creams, and over-the-counter medicines.  Previous problems you or members of your family have had with the use of anesthetics.  Any blood disorders you have.  Previous surgeries you have  had. History of kidney problems or failure.  Other medical conditions you have.  RISKS AND COMPLICATIONS  Generally, a coronary angiogram is a safe procedure. However, about 1 person out of 1000 can have problems that may include: Allergic reaction to the dye. Bleeding/bruising from the access site or other locations. Kidney injury, especially in people with impaired kidney function. Stroke (rare). Heart attack (rare). Irregular rhythms (rare) Death (rare)  BEFORE THE PROCEDURE  Do not eat or drink anything after midnight the night before the procedure or as directed by your health care provider.  Ask your health care provider about changing or stopping your regular medicines. This is especially important if you are taking diabetes medicines or blood thinners.  PROCEDURE You may be given a medicine to help you relax (sedative) before the procedure. This medicine is given through an intravenous (IV) access tube that is inserted into one of your veins.  The area where the catheter will be inserted will be washed and shaved. This is usually done in the groin but may be done in the fold of your arm (near your elbow) or in the wrist.  A medicine will be given to numb the area where the catheter will be inserted (local anesthetic).  The health care provider will insert the catheter into an artery. The catheter will be guided by using a special type of X-ray (fluoroscopy) of the blood vessel being examined.  A special dye will then be  injected into the catheter, and X-rays will be taken. The dye will help to show where any narrowing or blockages are located in the heart arteries.    AFTER THE PROCEDURE  If the procedure is done through the leg, you will be kept in bed lying flat for several hours. You will be instructed to not bend or cross your legs. The insertion site will be checked frequently.  The pulse in your feet or wrist will be checked frequently.  Additional blood tests,  X-rays, and an electrocardiogram may be done.       If you need a refill on your cardiac medications before your next appointment, please call your pharmacy.   Call the [place] office at [phone] if you have any questions, problems or concerns.

## 2015-06-19 ENCOUNTER — Encounter (HOSPITAL_COMMUNITY): Payer: Self-pay | Admitting: Pharmacy Technician

## 2015-06-19 LAB — APTT: aPTT: 38 seconds — ABNORMAL HIGH (ref 24–37)

## 2015-06-19 LAB — PROTIME-INR
INR: 0.93 (ref <1.50)
Prothrombin Time: 12.6 seconds (ref 11.6–15.2)

## 2015-06-20 ENCOUNTER — Telehealth: Payer: Self-pay | Admitting: Nurse Practitioner

## 2015-06-20 NOTE — Telephone Encounter (Signed)
New Message     Pt wants lab results

## 2015-06-23 ENCOUNTER — Ambulatory Visit (HOSPITAL_COMMUNITY)
Admission: RE | Admit: 2015-06-23 | Discharge: 2015-06-23 | Disposition: A | Discharge status: Home / Self Care | Payer: Medicare Other | Source: Ambulatory Visit | Attending: Cardiology | Admitting: Cardiology

## 2015-06-23 ENCOUNTER — Encounter (HOSPITAL_COMMUNITY)
Admission: RE | Disposition: A | Discharge status: Home / Self Care | Payer: Self-pay | Source: Ambulatory Visit | Attending: Cardiology

## 2015-06-23 DIAGNOSIS — Z7982 Long term (current) use of aspirin: Secondary | ICD-10-CM | POA: Diagnosis not present

## 2015-06-23 DIAGNOSIS — I10 Essential (primary) hypertension: Secondary | ICD-10-CM | POA: Insufficient documentation

## 2015-06-23 DIAGNOSIS — I472 Ventricular tachycardia: Secondary | ICD-10-CM | POA: Diagnosis not present

## 2015-06-23 DIAGNOSIS — K219 Gastro-esophageal reflux disease without esophagitis: Secondary | ICD-10-CM | POA: Diagnosis not present

## 2015-06-23 DIAGNOSIS — Z823 Family history of stroke: Secondary | ICD-10-CM | POA: Insufficient documentation

## 2015-06-23 DIAGNOSIS — I429 Cardiomyopathy, unspecified: Secondary | ICD-10-CM | POA: Diagnosis not present

## 2015-06-23 DIAGNOSIS — I2510 Atherosclerotic heart disease of native coronary artery without angina pectoris: Secondary | ICD-10-CM | POA: Diagnosis not present

## 2015-06-23 DIAGNOSIS — E785 Hyperlipidemia, unspecified: Secondary | ICD-10-CM | POA: Diagnosis not present

## 2015-06-23 DIAGNOSIS — M419 Scoliosis, unspecified: Secondary | ICD-10-CM | POA: Insufficient documentation

## 2015-06-23 DIAGNOSIS — R0789 Other chest pain: Secondary | ICD-10-CM | POA: Diagnosis not present

## 2015-06-23 DIAGNOSIS — E119 Type 2 diabetes mellitus without complications: Secondary | ICD-10-CM | POA: Diagnosis not present

## 2015-06-23 DIAGNOSIS — F17210 Nicotine dependence, cigarettes, uncomplicated: Secondary | ICD-10-CM | POA: Diagnosis not present

## 2015-06-23 DIAGNOSIS — Z6833 Body mass index (BMI) 33.0-33.9, adult: Secondary | ICD-10-CM | POA: Insufficient documentation

## 2015-06-23 DIAGNOSIS — R079 Chest pain, unspecified: Secondary | ICD-10-CM | POA: Insufficient documentation

## 2015-06-23 DIAGNOSIS — E669 Obesity, unspecified: Secondary | ICD-10-CM | POA: Insufficient documentation

## 2015-06-23 DIAGNOSIS — Z7984 Long term (current) use of oral hypoglycemic drugs: Secondary | ICD-10-CM | POA: Diagnosis not present

## 2015-06-23 DIAGNOSIS — Z8249 Family history of ischemic heart disease and other diseases of the circulatory system: Secondary | ICD-10-CM | POA: Insufficient documentation

## 2015-06-23 HISTORY — PX: CARDIAC CATHETERIZATION: SHX172

## 2015-06-23 SURGERY — LEFT HEART CATH AND CORONARY ANGIOGRAPHY
Anesthesia: LOCAL

## 2015-06-23 MED ORDER — SODIUM CHLORIDE 0.9 % IJ SOLN: 3.0000 mL | INTRAMUSCULAR | Status: DC | PRN

## 2015-06-23 MED ORDER — FENTANYL CITRATE (PF) 100 MCG/2ML IJ SOLN
INTRAMUSCULAR | Status: AC
  Filled 2015-06-23: qty 2

## 2015-06-23 MED ORDER — MIDAZOLAM HCL 2 MG/2ML IJ SOLN
INTRAMUSCULAR | Status: DC | PRN
  Administered 2015-06-23 (×4): 1 mg via INTRAVENOUS

## 2015-06-23 MED ORDER — MIDAZOLAM HCL 2 MG/2ML IJ SOLN
INTRAMUSCULAR | Status: AC
  Filled 2015-06-23: qty 2

## 2015-06-23 MED ORDER — ASPIRIN 81 MG PO CHEW
81.0000 mg | CHEWABLE_TABLET | ORAL | Status: AC
  Administered 2015-06-23: 81 mg via ORAL

## 2015-06-23 MED ORDER — SODIUM CHLORIDE 0.9 % IJ SOLN: 3.0000 mL | Freq: Two times a day (BID) | INTRAMUSCULAR | Status: DC

## 2015-06-23 MED ORDER — DIAZEPAM 5 MG PO TABS
ORAL_TABLET | ORAL | Status: AC
  Administered 2015-06-23: 10 mg via ORAL
  Filled 2015-06-23: qty 2

## 2015-06-23 MED ORDER — SODIUM CHLORIDE 0.9 % IV SOLN: 250.0000 mL | INTRAVENOUS | Status: DC | PRN

## 2015-06-23 MED ORDER — ACETAMINOPHEN 325 MG PO TABS: 650.0000 mg | ORAL_TABLET | ORAL | Status: DC | PRN

## 2015-06-23 MED ORDER — HEPARIN SODIUM (PORCINE) 1000 UNIT/ML IJ SOLN
INTRAMUSCULAR | Status: AC
  Filled 2015-06-23: qty 1

## 2015-06-23 MED ORDER — FENTANYL CITRATE (PF) 100 MCG/2ML IJ SOLN
INTRAMUSCULAR | Status: DC | PRN
Start: 2015-06-23 — End: 2015-06-23
  Administered 2015-06-23 (×4): 25 ug via INTRAVENOUS

## 2015-06-23 MED ORDER — ONDANSETRON HCL 4 MG/2ML IJ SOLN: 4.0000 mg | Freq: Four times a day (QID) | INTRAMUSCULAR | Status: DC | PRN

## 2015-06-23 MED ORDER — HEPARIN SODIUM (PORCINE) 1000 UNIT/ML IJ SOLN
INTRAMUSCULAR | Status: DC | PRN
  Administered 2015-06-23: 4000 [IU] via INTRAVENOUS

## 2015-06-23 MED ORDER — VERAPAMIL HCL 2.5 MG/ML IV SOLN
INTRAVENOUS | Status: AC
  Filled 2015-06-23: qty 2

## 2015-06-23 MED ORDER — LIDOCAINE HCL (PF) 1 % IJ SOLN
INTRAMUSCULAR | Status: AC
  Filled 2015-06-23: qty 30

## 2015-06-23 MED ORDER — ASPIRIN 81 MG PO CHEW
CHEWABLE_TABLET | ORAL | Status: AC
  Administered 2015-06-23: 81 mg via ORAL
  Filled 2015-06-23: qty 1

## 2015-06-23 MED ORDER — HEPARIN (PORCINE) IN NACL 2-0.9 UNIT/ML-% IJ SOLN
INTRAMUSCULAR | Status: DC | PRN
  Administered 2015-06-23: 14:00:00 via INTRA_ARTERIAL

## 2015-06-23 MED ORDER — HEPARIN (PORCINE) IN NACL 2-0.9 UNIT/ML-% IJ SOLN
INTRAMUSCULAR | Status: AC
  Filled 2015-06-23: qty 1000

## 2015-06-23 MED ORDER — SODIUM CHLORIDE 0.9 % IV SOLN
INTRAVENOUS | Status: DC
  Administered 2015-06-23: 13:00:00 via INTRAVENOUS

## 2015-06-23 MED ORDER — IOHEXOL 350 MG/ML SOLN
INTRAVENOUS | Status: DC | PRN
  Administered 2015-06-23: 80 mL via INTRA_ARTERIAL

## 2015-06-23 MED ORDER — LIDOCAINE HCL (PF) 1 % IJ SOLN
INTRAMUSCULAR | Status: DC | PRN
  Administered 2015-06-23: 14:00:00

## 2015-06-23 MED ORDER — DIAZEPAM 5 MG PO TABS
10.0000 mg | ORAL_TABLET | ORAL | Status: AC
  Administered 2015-06-23: 10 mg via ORAL

## 2015-06-23 MED ORDER — SODIUM CHLORIDE 0.9 % WEIGHT BASED INFUSION: 3.0000 mL/kg/h | INTRAVENOUS | Status: DC

## 2015-06-23 SURGICAL SUPPLY — 11 items
CATH INFINITI 5 FR JL3.5 (CATHETERS) ×2 IMPLANT
CATH INFINITI 5FR ANG PIGTAIL (CATHETERS) ×2 IMPLANT
CATH INFINITI JR4 5F (CATHETERS) ×2 IMPLANT
DEVICE RAD COMP TR BAND LRG (VASCULAR PRODUCTS) ×2 IMPLANT
GLIDESHEATH SLEND SS 6F .021 (SHEATH) ×2 IMPLANT
KIT HEART LEFT (KITS) ×2 IMPLANT
PACK CARDIAC CATHETERIZATION (CUSTOM PROCEDURE TRAY) ×2 IMPLANT
TRANSDUCER W/STOPCOCK (MISCELLANEOUS) ×2 IMPLANT
TUBING CIL FLEX 10 FLL-RA (TUBING) ×2 IMPLANT
WIRE HI TORQ VERSACORE-J 145CM (WIRE) ×3 IMPLANT
WIRE SAFE-T 1.5MM-J .035X260CM (WIRE) ×2 IMPLANT

## 2015-06-23 NOTE — Interval H&P Note (Signed)
Cath Lab Visit (complete for each Cath Lab visit)  Clinical Evaluation Leading to the Procedure:   ACS: No.  Non-ACS:    Anginal Classification: CCS III  Anti-ischemic medical therapy: Minimal Therapy (1 class of medications)  Non-Invasive Test Results: No non-invasive testing performed  Prior CABG: No previous CABG      History and Physical Interval Note:  06/23/2015 1:44 PM  [name]  has presented today for surgery, with the diagnosis of unstable angina  The various methods of treatment have been discussed with the patient and family. After consideration of risks, benefits and other options for treatment, the patient has consented to  Procedure(s): Left Heart Cath and Coronary Angiography (N/A) as a surgical intervention .  The patient's history has been reviewed, patient examined, no change in status, stable for surgery.  I have reviewed the patient's chart and labs.  Questions were answered to the patient's satisfaction.     Larrissa Stivers [employer]

## 2015-06-23 NOTE — Discharge Instructions (Signed)
NO METFORMIN FOR 2 DAYS ° ° °Radial Site Care °Refer to this sheet in the next few weeks. These instructions provide you with information about caring for yourself after your procedure. Your health care provider may also give you more specific instructions. Your treatment has been planned according to current medical practices, but problems sometimes occur. Call your health care provider if you have any problems or questions after your procedure. °WHAT TO EXPECT AFTER THE PROCEDURE °After your procedure, it is typical to have the following: °· Bruising at the radial site that usually fades within 1-2 weeks. °· Blood collecting in the tissue (hematoma) that may be painful to the touch. It should usually decrease in size and tenderness within 1-2 weeks. °HOME CARE INSTRUCTIONS °· Take medicines only as directed by your health care provider. °· You may shower 24-48 hours after the procedure or as directed by your health care provider. Remove the bandage (dressing) and gently wash the site with plain soap and water. Pat the area dry with a clean towel. Do not rub the site, because this may cause bleeding. °· Do not take baths, swim, or use a hot tub until your health care provider approves. °· Check your insertion site every day for redness, swelling, or drainage. °· Do not apply powder or lotion to the site. °· Do not flex or bend the affected arm for 24 hours or as directed by your health care provider. °· Do not push or pull heavy objects with the affected arm for 24 hours or as directed by your health care provider. °· Do not lift over 10 lb (4.5 kg) for 5 days after your procedure or as directed by your health care provider. °· Ask your health care provider when it is okay to: °¨ Return to work or school. °¨ Resume usual physical activities or sports. °¨ Resume sexual activity. °· Do not drive home if you are discharged the same day as the procedure. Have someone else drive you. °· You may drive 24 hours after the  procedure unless otherwise instructed by your health care provider. °· Do not operate machinery or power tools for 24 hours after the procedure. °· If your procedure was done as an outpatient procedure, which means that you went home the same day as your procedure, a responsible adult should be with you for the first 24 hours after you arrive home. °· Keep all follow-up visits as directed by your health care provider. This is important. °SEEK MEDICAL CARE IF: °· You have a fever. °· You have chills. °· You have increased bleeding from the radial site. Hold pressure on the site. °SEEK IMMEDIATE MEDICAL CARE IF: °· You have unusual pain at the radial site. °· You have redness, warmth, or swelling at the radial site. °· You have drainage (other than a small amount of blood on the dressing) from the radial site. °· The radial site is bleeding, and the bleeding does not stop after 30 minutes of holding steady pressure on the site. °· Your arm or hand becomes pale, cool, tingly, or numb. °  °This information is not intended to replace advice given to you by your health care provider. Make sure you discuss any questions you have with your health care provider. °  °Document Released: 08/07/2010 Document Revised: 07/26/2014 Document Reviewed: 01/21/2014 °Elsevier Interactive Patient Education ©2016 [employer] ° °

## 2015-06-23 NOTE — H&P (View-Only) (Signed)
CARDIOLOGY OFFICE NOTE  Date:  06/18/2015    [name] Date of Birth: Dec 11, 1955 Medical Record #[id number]  PCP:  [name], MD  Cardiologist:  Shirlee Latch    Chief Complaint  Patient presents with  . FU for cardiomyopathy    Follow up visit - seen for Dr. Shirlee Latch    History of Present Illness: [name] is a 59 y.o. female who presents today for a follow up visit. Seen for Dr. Shirlee Latch. She has a history of membranous VSD (small), cardiomyopathy (probably due to HTN, EF improved on most recent echo), and prior VT s/p ablation.   In 2/15, she had a Lexiscan Cardiolite showing EF 61% and a fixed small apical defect with no ischemia. Repeat echo in 2/15 showed EF 45-50% with small stable perimembranous VSD and and normal RV.   Last seen in March - had had a prior fall with right femur fracture in 11/15. This was surgically repaired. She was still smoking. She was no longer on Coreg.   Comes back today. Here alone. Says she is ready to "start taking care of herself". Having more chest pain. Now 1 to 2 spells per month that is progressing to once a week- had 3 spells this past Monday. Lots of stress. Her pain is located under left breast or midsternal. No radiation. Not really short of breath. She is worried now that something is wrong. Will last few minutes. No aggravating factors. No regular exercise due to her orthopedic injuries. Pretty sedentary. Weight is up. Multiple risk factors.   PMH: 1. GERD 2. HTN 3. Membranous VSD: Small.  4. Scoliosis 5. VT: s/p VT ablation in the early 1990s in [place].  6. Cardiomyopathy: Possibly related to HTN. This improved with control of BP. Last echo in 3/13 showed EF 50-55%, small perimembranous VSD, mild LVH, mild AI, PA systolic pressure 32 mmHg. Echo (2/15) with EF 45-50%, stable small perimembranous VSD, normal RV size and systolic function, mild aortic insufficiency.  7. Type II diabetes 8. Atypical  chest pain: Lexiscan Cardiolite (2/15) with fixed small apical defect, no ischemia (low risk).  9. Bilateral THR   Past Medical History  Diagnosis Date  . Hypertension   . Ventricular septal defect   . Paroxysmal ventricular tachycardia   . Diabetes mellitus   . Broken femur     right    Past Surgical History  Procedure Laterality Date  . Shoulder arthroscopy      right  . Hernia repair    . Joint replacement    . Abdominal hysterectomy    . Exam under anesthesia with manipulation of hip Right 06/02/2014    dr Jerl Santos  . Total hip arthroplasty Right 05/10/2014    [facility]      by dr Carola Rhine  . Hip closed reduction Right 06/02/2014    Procedure: CLOSED MANIPULATION HIP;  Surgeon: Velna Ochs, MD;  Location: [facility] OR;  Service: Orthopedics;  Laterality: Right;     Medications: Current Outpatient Prescriptions  Medication Sig Dispense Refill  . amoxicillin (AMOXIL) 500 MG capsule TK 4 CS PO 1 HOUR PRIOR TO DENTAL APPOINTMENT  0  . aspirin 81 MG tablet Take 81 mg by mouth daily.      [name] atorvastatin (LIPITOR) 10 MG tablet Take 1 tablet (10 mg total) by mouth daily. 30 tablet 3  . buPROPion (WELLBUTRIN XL) 300 MG 24 hr tablet Take 300 mg by mouth daily.      [name]  carvedilol (COREG) 3.125 MG tablet Take 1 tablet (3.125 mg total) by mouth 2 (two) times daily. 180 tablet 3  . celecoxib (CELEBREX) 200 MG capsule TK 1 C PO BID PRN  5  . fluvoxaMINE (LUVOX) 100 MG tablet Take 300 mg by mouth at bedtime.     . gabapentin (NEURONTIN) 300 MG capsule Take 600 mg by mouth at bedtime.     [name] levothyroxine (SYNTHROID, LEVOTHROID) 50 MCG tablet Take 50 mcg by mouth daily before breakfast.    . losartan-hydrochlorothiazide (HYZAAR) 100-12.5 MG per tablet Take 1 tablet by mouth daily. 30 tablet 9  . metFORMIN (GLUCOPHAGE) 1000 MG tablet Take 1,000 mg by mouth daily.      . Multiple Vitamin (MULTIVITAMIN) tablet Take 1 tablet by mouth daily.      [name] omeprazole (PRILOSEC OTC) 20 MG  tablet Take 20 mg by mouth daily.      [name] oxyCODONE-acetaminophen (PERCOCET) 5-325 MG per tablet Take 1 tablet by mouth every 6 (six) hours as needed.     [name] oxyCODONE-acetaminophen (PERCOCET) 7.5-325 MG tablet Take 1 tablet by mouth every 4 (four) hours as needed for moderate pain.   0  . QUEtiapine (SEROQUEL) 300 MG tablet Take 600 mg by mouth at bedtime.      . traZODone (DESYREL) 100 MG tablet Take 100 mg by mouth at bedtime.      . nitroGLYCERIN (NITROSTAT) 0.4 MG SL tablet Place 1 tablet (0.4 mg total) under the tongue every 5 (five) minutes as needed for chest pain. 25 tablet 3   No current facility-administered medications for this visit.    Allergies: No Known Allergies  Social History: The patient  reports that she has been smoking Cigarettes.  She has never used smokeless tobacco. She reports that she does not drink alcohol or use illicit drugs.   Family History: The patient's family history includes Heart attack in her other; Other in her mother; Stroke (age of onset: 57) in her father.   Review of Systems: Please see the history of present illness.   Otherwise, the review of systems is positive for none.   All other systems are reviewed and negative.   Physical Exam: VS:  BP 138/80 mmHg  Pulse 91  Ht 5\' 4"  (1.626 m)  Wt 196 lb 12.8 oz (89.268 kg)  BMI 33.76 kg/m2  SpO2 97% .  BMI Body mass index is 33.76 kg/(m^2).  Wt Readings from Last 3 Encounters:  06/18/15 196 lb 12.8 oz (89.268 kg)  01/11/15 180 lb (81.647 kg)  10/17/14 184 lb (83.462 kg)    General: Pleasant. Seems a little anxious but in no acute distress. Smells of tobacco. She looks older than stated age.  HEENT: Normal. Neck: Supple, no JVD, carotid bruits, or masses noted.  Cardiac: Regular rate and rhythm. No murmurs, rubs, or gallops. No edema.  Respiratory:  Lungs are coarse but with normal work of breathing.  GI: Soft and nontender.  MS: No deformity or atrophy. Gait and ROM intact. Skin: Warm and  dry. Color is normal.  Neuro:  Strength and sensation are intact and no gross focal deficits noted.  Psych: Alert, appropriate and with normal affect.   LABORATORY DATA:  EKG:  EKG is ordered today. This demonstrates NSR with nonspecific T wave changes.  Lab Results  Component Value Date   WBC 11.0* 06/02/2014   HGB 9.6* 06/02/2014   HCT 29.2* 06/02/2014   PLT 386 06/02/2014   GLUCOSE 106* 06/02/2014  CHOL 153 12/25/2014   TRIG 204.0* 12/25/2014   HDL 39.40 12/25/2014   LDLDIRECT 73.0 12/25/2014   ALT 38* 12/25/2014   AST 32 12/25/2014   NA 129* 06/02/2014   K 4.3 06/02/2014   CL 92* 06/02/2014   CREATININE 0.56 06/02/2014   BUN 8 06/02/2014   CO2 21 06/02/2014   INR 1.07 06/02/2014    BNP (last 3 results) No results for input(s): BNP in the last 8760 hours.  ProBNP (last 3 results) No results for input(s): PROBNP in the last 8760 hours.   Other Studies Reviewed Today:  Echo Study Conclusions from 10/2014  - Left ventricle: The cavity size was normal. Systolic function was mildly reduced. The estimated ejection fraction was in the range of 45% to 50%. Wall motion was normal; there were no regional wall motion abnormalities. Doppler parameters are consistent with abnormal left ventricular relaxation (grade 1 diastolic dysfunction). - Ventricular septum: There was a small congenital ventricular septal defectin the perimembranous region. - Mitral valve: There was mild regurgitation. - Left atrium: The atrium was mildly dilated.  Impressions:  - Compared to the prior study, there has been no significant interval change.   Assessment/Plan: 1. Chest pain:  Lexiscan Cardiolite was low risk in 2/15 . Her symptoms are progressing with more frequency. Multiple risk factors that include long standing smoking, HTN, HLD, DM and obesity. Will proceed with cardiac cath. The patient understands that risks include but are not limited to stroke (1 in 1000),  death (1 in 1000), kidney failure [usually temporary] (1 in 500), bleeding (1 in 200), allergic reaction [possibly serious] (1 in 200), and agrees to proceed.   2. Smoking: I strongly encouraged her to quit smoking. Not sure she is quite ready to stop.  3. Hyperlipidemia: on statin therapy  4. Perimembranous VSD: Soft murmur noted. This is small and has been known for a long time. Most recent echo from April noted.   5. Cardiomyopathy: Possibly related to HTN. EF 45-50% on most recent echo, this is relatively stable. She remains on ACE and beta blocker therapy  6. HTN: BP controlled on current regimen.   7. H/o VT: s/p ablation. No symptomatic recurrence.   Current medicines are reviewed with the patient today.  The patient does not have concerns regarding medicines other than what has been noted above.  The following changes have been made:  See above.  Labs/ tests ordered today include:    Orders Placed This Encounter  Procedures  . DG Chest 2 View  . Basic metabolic panel  . CBC  . Protime-INR  . APTT  . EKG 12-Lead     Disposition:   Further disposition to follow. Cardiac cath arranged for Monday at 1:30 pm with Dr. McLean  Patient is agreeable to this plan and will call if any problems develop in the interim.   Signed: Colleen Donahoe C. Arval Brandstetter, RN, ANP-C 06/18/2015 12:16 PM  [facility] [address] [place], [place]  [address] Phone: [phone] Fax: [phone]         

## 2015-06-24 ENCOUNTER — Encounter (HOSPITAL_COMMUNITY): Payer: Self-pay | Admitting: Cardiology

## 2015-11-11 ENCOUNTER — Other Ambulatory Visit: Payer: Self-pay

## 2015-11-11 DIAGNOSIS — Z1231 Encounter for screening mammogram for malignant neoplasm of breast: Secondary | ICD-10-CM

## 2015-11-26 ENCOUNTER — Ambulatory Visit
Admission: RE | Admit: 2015-11-26 | Discharge: 2015-11-26 | Disposition: A | Discharge status: Home / Self Care | Payer: Medicare Other | Source: Ambulatory Visit

## 2015-11-26 DIAGNOSIS — Z1231 Encounter for screening mammogram for malignant neoplasm of breast: Secondary | ICD-10-CM

## 2016-05-19 ENCOUNTER — Emergency Department (HOSPITAL_COMMUNITY): Payer: Medicare Other

## 2016-05-19 ENCOUNTER — Encounter (HOSPITAL_COMMUNITY): Payer: Self-pay

## 2016-05-19 ENCOUNTER — Emergency Department (HOSPITAL_COMMUNITY)
Admission: EM | Admit: 2016-05-19 | Discharge: 2016-05-19 | Disposition: A | Discharge status: Home / Self Care | Payer: Medicare Other | Attending: Emergency Medicine | Admitting: Emergency Medicine

## 2016-05-19 DIAGNOSIS — Z7984 Long term (current) use of oral hypoglycemic drugs: Secondary | ICD-10-CM | POA: Diagnosis not present

## 2016-05-19 DIAGNOSIS — Z96641 Presence of right artificial hip joint: Secondary | ICD-10-CM | POA: Diagnosis not present

## 2016-05-19 DIAGNOSIS — Z7982 Long term (current) use of aspirin: Secondary | ICD-10-CM | POA: Insufficient documentation

## 2016-05-19 DIAGNOSIS — E119 Type 2 diabetes mellitus without complications: Secondary | ICD-10-CM | POA: Diagnosis not present

## 2016-05-19 DIAGNOSIS — Z79899 Other long term (current) drug therapy: Secondary | ICD-10-CM | POA: Insufficient documentation

## 2016-05-19 DIAGNOSIS — I10 Essential (primary) hypertension: Secondary | ICD-10-CM | POA: Diagnosis not present

## 2016-05-19 DIAGNOSIS — R5383 Other fatigue: Secondary | ICD-10-CM | POA: Diagnosis present

## 2016-05-19 DIAGNOSIS — F17210 Nicotine dependence, cigarettes, uncomplicated: Secondary | ICD-10-CM | POA: Diagnosis not present

## 2016-05-19 DIAGNOSIS — N390 Urinary tract infection, site not specified: Secondary | ICD-10-CM | POA: Insufficient documentation

## 2016-05-19 DIAGNOSIS — R42 Dizziness and giddiness: Secondary | ICD-10-CM | POA: Insufficient documentation

## 2016-05-19 LAB — BASIC METABOLIC PANEL
Anion gap: 10 (ref 5–15)
BUN: 9 mg/dL (ref 6–20)
CHLORIDE: 101 mmol/L (ref 101–111)
CO2: 23 mmol/L (ref 22–32)
Calcium: 9.2 mg/dL (ref 8.9–10.3)
Creatinine, Ser: 0.72 mg/dL (ref 0.44–1.00)
GFR calc Af Amer: >60 mL/min (ref >60)
GFR calc non Af Amer: >60 mL/min (ref >60)
GLUCOSE: 220 mg/dL — AB (ref 65–99)
POTASSIUM: 4.4 mmol/L (ref 3.5–5.1)
Sodium: 134 mmol/L — ABNORMAL LOW (ref 135–145)

## 2016-05-19 LAB — URINALYSIS, ROUTINE W REFLEX MICROSCOPIC
BILIRUBIN URINE: NEGATIVE
GLUCOSE, UA: 250 mg/dL — AB
Ketones, ur: NEGATIVE mg/dL
NITRITE: POSITIVE — AB
PH: 6 (ref 5.0–8.0)
Protein, ur: NEGATIVE mg/dL
SPECIFIC GRAVITY, URINE: 1.011 (ref 1.005–1.030)

## 2016-05-19 LAB — CBC
HEMATOCRIT: 41 % (ref 36.0–46.0)
Hemoglobin: 14.3 g/dL (ref 12.0–15.0)
MCH: 32.2 pg (ref 26.0–34.0)
MCHC: 34.9 g/dL (ref 30.0–36.0)
MCV: 92.3 fL (ref 78.0–100.0)
Platelets: 456 10*3/uL — ABNORMAL HIGH (ref 150–400)
RBC: 4.44 MIL/uL (ref 3.87–5.11)
RDW: 13.9 % (ref 11.5–15.5)
WBC: 16.3 10*3/uL — AB (ref 4.0–10.5)

## 2016-05-19 LAB — I-STAT TROPONIN, ED: Troponin i, poc: 0 ng/mL (ref 0.00–0.08)

## 2016-05-19 LAB — URINE MICROSCOPIC-ADD ON

## 2016-05-19 MED ORDER — CEPHALEXIN 500 MG PO CAPS: 500.0000 mg | ORAL_CAPSULE | Freq: Four times a day (QID) | ORAL | 0 refills | Status: DC

## 2016-05-19 MED ORDER — CEPHALEXIN 250 MG PO CAPS
500.0000 mg | ORAL_CAPSULE | Freq: Once | ORAL | Status: AC
  Administered 2016-05-19: 500 mg via ORAL
  Filled 2016-05-19: qty 2

## 2016-05-19 NOTE — Discharge Instructions (Signed)
Try to drink an extra one to 2 L of water each day.  Try to eat 3 regular meals each day.

## 2016-05-19 NOTE — ED Notes (Signed)
Patient brought back to room with family in tow; patient undressed, in gown, on monitor, continuous pulse oximetry and blood pressure cuff; visitor at bedside 

## 2016-05-19 NOTE — ED Provider Notes (Signed)
[facility]-EMERGENCY DEPT Provider Note   CSN: [id number] Arrival date & time: 05/19/16  1321     History   Chief Complaint Chief Complaint  Patient presents with  . Dizziness  . Aphasia    HPI [name] is a 60 y.o. female.   She presents for evaluation of several days. Sensation of dizziness, fatigue, weakness, worried about falling when walking, decreased appetite, nausea without vomiting or diarrhea. She does not feel like she has had elevated temperature. She denies cough, sore throat, back pain, headache, falling or paresthesias. Her husband states that she occasionally has some difficulty with word finding, but he does not describe her having a difficulty stating problems which she is having, or understanding what he says to her. He feels that she is improved, relative to earlier today, while seen in the emergency department. She continues to ambulate normally, using her rolling walker. She has chronic pain and continues to take Percocet regularly for it. There are no other known modifying factors.  HPI  Past Medical History:  Diagnosis Date  . Broken femur (HCC)    right  . Diabetes mellitus   . Hypertension   . Paroxysmal ventricular tachycardia (HCC)   . Ventricular septal defect     Patient Active Problem List   Diagnosis Date Noted  . Anxiety 10/17/2014  . Acid reflux 10/17/2014  . BP (high blood pressure) 10/17/2014  . Arthritis, degenerative 10/17/2014  . Adult hypothyroidism 10/17/2014  . UTI (urinary tract infection) 06/03/2014  . Fracture of bone adjacent to prosthesis 06/03/2014  . Diabetes (HCC) 06/02/2014  . Peri-prosthetic fracture of femur following total hip arthroplasty 06/02/2014  . Bladder retention 05/13/2014  . Chronic pain 05/10/2014  . History of hip surgery 05/10/2014  . UNSPECIFIED HEART FAILURE 06/24/2010  . UNSPECIFIED CONGENITAL DEFECT OF SEPTAL CLOSURE 06/24/2010  . TOBACCO ABUSE 03/18/2010  . Secondary cardiomyopathy (HCC)  03/18/2010  . DM 06/13/2009  . HYPERTENSION, UNSPECIFIED 06/13/2009  . VENTRICULAR TACHYCARDIA 06/13/2009  . VENTRICULAR SEPTAL DEFECT, CONGENITAL 06/13/2009  . Diabetes mellitus, type 2 (HCC) 06/13/2009  . Essential (primary) hypertension 06/13/2009    Past Surgical History:  Procedure Laterality Date  . ABDOMINAL HYSTERECTOMY    . CARDIAC CATHETERIZATION N/A 06/23/2015   Procedure: Left Heart Cath and Coronary Angiography;  Surgeon: Laurey Moralealton S McLean, MD;  [facility];  Service: Cardiovascular;  Laterality: N/A;  . EXAM UNDER ANESTHESIA WITH MANIPULATION OF HIP Right 06/02/2014   dr Jerl Santosdalldorf  . HERNIA REPAIR    . HIP CLOSED REDUCTION Right 06/02/2014   Procedure: CLOSED MANIPULATION HIP;  Surgeon: Velna OchsPeter G Dalldorf, MD;  Location: [facility] OR;  Service: Orthopedics;  Laterality: Right;  . JOINT REPLACEMENT    . SHOULDER ARTHROSCOPY     right  . TOTAL HIP ARTHROPLASTY Right 05/10/2014   [facility]      by dr Cristal Deerchristopher olcott    OB History    No data available       Home Medications    Prior to Admission medications   Medication Sig Start Date End Date Taking? Authorizing Provider  aspirin 81 MG tablet Take 81 mg by mouth daily.     Yes Historical Provider, MD  atorvastatin (LIPITOR) 10 MG tablet Take 1 tablet (10 mg total) by mouth daily. 10/24/14  Yes Laurey Moralealton S McLean, MD  buPROPion (WELLBUTRIN XL) 300 MG 24 hr tablet Take 300 mg by mouth daily.     Yes Historical Provider, MD  celecoxib (CELEBREX) 200  MG capsule Take 200 mg by mouth daily.    Yes Historical Provider, MD  fluvoxaMINE (LUVOX) 100 MG tablet Take 300 mg by mouth at bedtime.    Yes Historical Provider, MD  levothyroxine (SYNTHROID, LEVOTHROID) 50 MCG tablet Take 50 mcg by mouth daily before breakfast.   Yes Historical Provider, MD  losartan-hydrochlorothiazide (HYZAAR) 100-12.5 MG per tablet Take 1 tablet by mouth daily. 02/02/12  Yes Herby Abraham, MD  LYRICA 100 MG capsule Take 100 mg by mouth 2  (two) times daily. 05/21/15  Yes Historical Provider, MD  metFORMIN (GLUCOPHAGE) 1000 MG tablet Take 1,000 mg by mouth daily.     Yes Historical Provider, MD  Multiple Vitamin (MULTIVITAMIN) tablet Take 1 tablet by mouth daily.     Yes Historical Provider, MD  omeprazole (PRILOSEC OTC) 20 MG tablet Take 20 mg by mouth daily.     Yes Historical Provider, MD  oxyCODONE-acetaminophen (PERCOCET) 5-325 MG per tablet Take 1 tablet by mouth every 6 (six) hours as needed for moderate pain.    Yes Historical Provider, MD  oxyCODONE-acetaminophen (PERCOCET) 7.5-325 MG tablet Take 1 tablet by mouth every 4 (four) hours as needed for moderate pain.  06/17/15  Yes Historical Provider, MD  QUEtiapine (SEROQUEL) 300 MG tablet Take 600 mg by mouth at bedtime.     Yes Historical Provider, MD  traZODone (DESYREL) 100 MG tablet Take 100 mg by mouth at bedtime.     Yes Historical Provider, MD  amoxicillin (AMOXIL) 500 MG capsule Take 2,000 mg by mouth once. Take 4 capsules one hour prior to dental appointment    Historical Provider, MD  cephALEXin (KEFLEX) 500 MG capsule Take 1 capsule (500 mg total) by mouth 4 (four) times daily. 05/19/16   Mancel Bale, MD  gabapentin (NEURONTIN) 300 MG capsule Take 600 mg by mouth at bedtime.     Historical Provider, MD  nitroGLYCERIN (NITROSTAT) 0.4 MG SL tablet Place 1 tablet (0.4 mg total) under the tongue every 5 (five) minutes as needed for chest pain. 06/18/15   Rosalio Macadamia, NP    Family History Family History  Problem Relation Age of Onset  . Stroke Father 34    deceased  . Other Mother     alive  . Heart attack Other     multiple uncles have died with myocardial infarction    Social History Social History  Substance Use Topics  . Smoking status: Current Every Day Smoker    Types: Cigarettes  . Smokeless tobacco: Never Used  . Alcohol use No     Allergies   Review of patient's allergies indicates no known allergies.   Review of Systems Review of  Systems  All other systems reviewed and are negative.    Physical Exam Updated Vital Signs BP 132/96 (BP Location: Left Arm)   Pulse 112   Temp 97.7 F (36.5 C) (Oral)   Resp 16   SpO2 97%   Physical Exam  Constitutional: She is oriented to person, place, and time. She appears well-developed and well-nourished. No distress.  HENT:  Head: Normocephalic and atraumatic.  Eyes: Conjunctivae and EOM are normal. Pupils are equal, round, and reactive to light.  Neck: Normal range of motion and phonation normal. Neck supple.  Cardiovascular: Normal rate and regular rhythm.   Pulmonary/Chest: Effort normal and breath sounds normal. She exhibits no tenderness.  Abdominal: Soft. She exhibits no distension. There is no tenderness. There is no guarding.  Genitourinary:  Genitourinary Comments: No  costovertebral angle tenderness with percussion.  Musculoskeletal: Normal range of motion.  Neurological: She is alert and oriented to person, place, and time. No cranial nerve deficit. She exhibits normal muscle tone. Coordination normal.  No dysarthria, aphasia or nystagmus. No pronator drift.  Skin: Skin is warm and dry.  Psychiatric: She has a normal mood and affect. Her behavior is normal. Judgment and thought content normal.  Nursing note and vitals reviewed.    ED Treatments / Results  Labs (all labs ordered are listed, but only abnormal results are displayed) Labs Reviewed  BASIC METABOLIC PANEL - Abnormal; Notable for the following:       Result Value   Sodium 134 (*)    Glucose, Bld 220 (*)    All other components within normal limits  CBC - Abnormal; Notable for the following:    WBC 16.3 (*)    Platelets 456 (*)    All other components within normal limits  URINALYSIS, ROUTINE W REFLEX MICROSCOPIC (NOT AT [facility]) - Abnormal; Notable for the following:    APPearance CLOUDY (*)    Glucose, UA 250 (*)    Hgb urine dipstick TRACE (*)    Nitrite POSITIVE (*)    Leukocytes, UA  MODERATE (*)    All other components within normal limits  URINE MICROSCOPIC-ADD ON - Abnormal; Notable for the following:    Squamous Epithelial / LPF 0-5 (*)    Bacteria, UA MANY (*)    All other components within normal limits  I-STAT TROPOININ, ED    EKG  EKG Interpretation  Date/Time:  Wednesday May 19 2016 14:10:34 EDT Ventricular Rate:  110 PR Interval:  138 QRS Duration: 98 QT Interval:  376 QTC Calculation: 508 R Axis:   -62 Text Interpretation:  Sinus tachycardia Left anterior fascicular block Abnormal ECG No old tracing to compare Confirmed by [facility]  MD, Tiannah Greenly [phone]) on 05/19/2016 5:39:02 PM       Radiology Dg Chest 2 View  Result Date: 05/19/2016 CLINICAL DATA:  Chest pain, cough for 1 week EXAM: CHEST  2 VIEW COMPARISON:  06/18/2015 FINDINGS: Cardiomediastinal silhouette is stable. Elevation of the left hemidiaphragm again noted. No infiltrate or pulmonary edema. Mild perihilar bronchitic changes. IMPRESSION: No infiltrate or pulmonary edema. Mild perihilar bronchitic changes. Electronically Signed   By: Natasha Mead M.D.   On: 05/19/2016 14:45    Procedures Procedures (including critical care time)  Medications Ordered in ED Medications  cephALEXin (KEFLEX) capsule 500 mg (500 mg Oral Given 05/19/16 1822)     Initial Impression / Assessment and Plan / ED Course  I have reviewed the triage vital signs and the nursing notes.  Pertinent labs & imaging results that were available during my care of the patient were reviewed by me and considered in my medical decision making (see chart for details).  Clinical Course  Value Comment By Time  WBC, UA: TOO NUMEROUS TO COUNT high Mancel Bale, MD 11/01 1739  Bacteria, UA: (!) MANY High  Mancel Bale, MD 11/01 1739  Glucose: (!) 220 High  Mancel Bale, MD 11/01 1739  Nitrite: (!) POSITIVE abnormal Mancel Bale, MD 11/01 1739  WBC: (!) 16.3 high Mancel Bale, MD 11/01 1740  Pulse Rate: 116 high Mancel Bale, MD 11/01 1740  BP: (!) 151/114 high Mancel Bale, MD 11/01 1740  BP: 132/96 Trend toward normal Mancel Bale, MD 11/01 1740    Medications  cephALEXin (KEFLEX) capsule 500 mg (500 mg Oral Given 05/19/16 1822)  Patient Vitals for the past 24 hrs:  BP Temp Temp src Pulse Resp SpO2  05/19/16 1643 132/96 97.7 F (36.5 C) Oral 112 16 97 %  05/19/16 1328 (!) 151/114 98.4 F (36.9 C) Oral 116 20 99 %    6:36 PM Reevaluation with update and discussion. After initial assessment and treatment, an updated evaluation reveals She remains comfortable. Findings discussed with patient and husband, all questions answered. Tambra Muller L    Final Clinical Impressions(s) / ED Diagnoses   Final diagnoses:  Urinary tract infection without hematuria, site unspecified    Evaluation is consistent with UTI. Doubt sepsis or metabolic instability. Nonspecific mild elevation of glucose, nonfasting, without ketosis. Blood pressure initially hypertensive, improved, with rest. No indicator, for further evaluation or treatment in the emergency department setting, or hospitalization.  Nursing Notes Reviewed/ Care Coordinated Applicable Imaging Reviewed Interpretation of Laboratory Data incorporated into ED treatment  The patient appears reasonably screened and/or stabilized for discharge and I doubt any other medical condition or other [facility] requiring further screening, evaluation, or treatment in the ED at this time prior to discharge.  Plan: Home Medications- continue; Home Treatments- increase oral hydration with water 1-2 L each day; return here if the recommended treatment, does not improve the symptoms; Recommended follow up- PCP, checkup in one week.   New Prescriptions New Prescriptions   CEPHALEXIN (KEFLEX) 500 MG CAPSULE    Take 1 capsule (500 mg total) by mouth 4 (four) times daily.     Mancel Bale, MD 05/19/16 [phone]

## 2016-05-19 NOTE — ED Triage Notes (Addendum)
Patient here with dizziness and slurred speech since Sunday. On arrival alert and oriented and no slurred speech noted. No neuro deficits. Patient also concerned that she has had chest tightness x 2 days. NAD.NO neuro deficits

## 2016-06-23 ENCOUNTER — Encounter: Payer: Self-pay | Admitting: Nurse Practitioner

## 2016-06-28 ENCOUNTER — Telehealth: Payer: Self-pay | Admitting: *Deleted

## 2016-06-28 ENCOUNTER — Ambulatory Visit: Payer: Medicare Other | Admitting: Nurse Practitioner

## 2016-06-28 NOTE — Progress Notes (Deleted)
CARDIOLOGY OFFICE NOTE  Date:  06/28/2016    Marissa CollegeElizabeth G [name] Date of Birth: [birth year] Medical Record #[id number]  PCP:  Lolita PatellaEADE,ROBERT ALEXANDER, MD  Cardiologist:  Tyrone SageGerhardt & ***    No chief complaint on file.   History of Present Illness: Marissa Collegelizabeth G Merkin is a 60 y.o. female who presents today for a ***   Comes in today. Here with   Past Medical History:  Diagnosis Date  . Broken femur (HCC)    right  . Diabetes mellitus   . Hypertension   . Paroxysmal ventricular tachycardia (HCC)   . Ventricular septal defect     Past Surgical History:  Procedure Laterality Date  . ABDOMINAL HYSTERECTOMY    . CARDIAC CATHETERIZATION N/A 06/23/2015   Procedure: Left Heart Cath and Coronary Angiography;  Surgeon: Laurey Moralealton S McLean, MD;  [facility];  Service: Cardiovascular;  Laterality: N/A;  . EXAM UNDER ANESTHESIA WITH MANIPULATION OF HIP Right 06/02/2014   dr Jerl Santosdalldorf  . HERNIA REPAIR    . HIP CLOSED REDUCTION Right 06/02/2014   Procedure: CLOSED MANIPULATION HIP;  Surgeon: Velna OchsPeter G Dalldorf, MD;  Location: [facility] OR;  Service: Orthopedics;  Laterality: Right;  . JOINT REPLACEMENT    . SHOULDER ARTHROSCOPY     right  . TOTAL HIP ARTHROPLASTY Right 05/10/2014   [place]      by dr Carola Rhinechristopher olcott     Medications: Current Outpatient Prescriptions  Medication Sig Dispense Refill  . amoxicillin (AMOXIL) 500 MG capsule Take 2,000 mg by mouth once. Take 4 capsules one hour prior to dental appointment    . aspirin 81 MG tablet Take 81 mg by mouth daily.      [name]. atorvastatin (LIPITOR) 10 MG tablet Take 1 tablet (10 mg total) by mouth daily. 30 tablet 3  . buPROPion (WELLBUTRIN XL) 300 MG 24 hr tablet Take 300 mg by mouth daily.      . celecoxib (CELEBREX) 200 MG capsule Take 200 mg by mouth daily.     . cephALEXin (KEFLEX) 500 MG capsule Take 1 capsule (500 mg total) by mouth 4 (four) times daily. 20 capsule 0  . fluvoxaMINE (LUVOX) 100 MG tablet Take  300 mg by mouth at bedtime.     . gabapentin (NEURONTIN) 300 MG capsule Take 600 mg by mouth at bedtime.     [name]. levothyroxine (SYNTHROID, LEVOTHROID) 50 MCG tablet Take 50 mcg by mouth daily before breakfast.    . losartan-hydrochlorothiazide (HYZAAR) 100-12.5 MG per tablet Take 1 tablet by mouth daily. 30 tablet 9  . LYRICA 100 MG capsule Take 100 mg by mouth 2 (two) times daily.  1  . metFORMIN (GLUCOPHAGE) 1000 MG tablet Take 1,000 mg by mouth daily.      . Multiple Vitamin (MULTIVITAMIN) tablet Take 1 tablet by mouth daily.      . nitroGLYCERIN (NITROSTAT) 0.4 MG SL tablet Place 1 tablet (0.4 mg total) under the tongue every 5 (five) minutes as needed for chest pain. 25 tablet 3  . omeprazole (PRILOSEC OTC) 20 MG tablet Take 20 mg by mouth daily.      [name]. oxyCODONE-acetaminophen (PERCOCET) 5-325 MG per tablet Take 1 tablet by mouth every 6 (six) hours as needed for moderate pain.     [name]. oxyCODONE-acetaminophen (PERCOCET) 7.5-325 MG tablet Take 1 tablet by mouth every 4 (four) hours as needed for moderate pain.   0  . QUEtiapine (SEROQUEL) 300 MG tablet Take 600 mg by mouth  at bedtime.      . traZODone (DESYREL) 100 MG tablet Take 100 mg by mouth at bedtime.       No current facility-administered medications for this visit.     Allergies: No Known Allergies  Social History: The patient  reports that she has been smoking Cigarettes.  She has never used smokeless tobacco. She reports that she does not drink alcohol or use drugs.   Family History: The patient's ***family history includes Heart attack in her other; Other in her mother; Stroke (age of onset: 31) in her father.   Review of Systems: Please see the history of present illness.   Otherwise, the review of systems is positive for {NONE DEFAULTED:18576::"none"}.   All other systems are reviewed and negative.   Physical Exam: VS:  There were no vitals taken for this visit. [name]  BMI There is no height or weight on file to calculate  BMI.  Wt Readings from Last 3 Encounters:  06/23/15 185 lb (83.9 kg)  06/18/15 196 lb 12.8 oz (89.3 kg)  01/11/15 180 lb (81.6 kg)    General: Pleasant. Well developed, well nourished and in no acute distress.   HEENT: Normal.  Neck: Supple, no JVD, carotid bruits, or masses noted.  Cardiac: ***Regular rate and rhythm. No murmurs, rubs, or gallops. No edema.  Respiratory:  Lungs are clear to auscultation bilaterally with normal work of breathing.  GI: Soft and nontender.  MS: No deformity or atrophy. Gait and ROM intact.  Skin: Warm and dry. Color is normal.  Neuro:  Strength and sensation are intact and no gross focal deficits noted.  Psych: Alert, appropriate and with normal affect.   LABORATORY DATA:  EKG:  EKG {ACTION; IS/IS EZM:[id number]} ordered today. This demonstrates ***.  Lab Results  Component Value Date   WBC 16.3 (H) 05/19/2016   HGB 14.3 05/19/2016   HCT 41.0 05/19/2016   PLT 456 (H) 05/19/2016   GLUCOSE 220 (H) 05/19/2016   CHOL 153 12/25/2014   TRIG 204.0 (H) 12/25/2014   HDL 39.40 12/25/2014   LDLDIRECT 73.0 12/25/2014   ALT 38 (H) 12/25/2014   AST 32 12/25/2014   NA 134 (L) 05/19/2016   K 4.4 05/19/2016   CL 101 05/19/2016   CREATININE 0.72 05/19/2016   BUN 9 05/19/2016   CO2 23 05/19/2016   INR 0.93 06/18/2015    BNP (last 3 results) No results for input(s): BNP in the last 8760 hours.  ProBNP (last 3 results) No results for input(s): PROBNP in the last 8760 hours.   Other Studies Reviewed Today:   Assessment/Plan:   Current medicines are reviewed with the patient today.  The patient does not have concerns regarding medicines other than what has been noted above.  The following changes have been made:  See above.  Labs/ tests ordered today include:   No orders of the defined types were placed in this encounter.    Disposition:   FU with *** in {gen number 6-50:354656} {Days to years:10300}.   Patient is agreeable to this plan  and will call if any problems develop in the interim.   Signed: Rosalio Macadamia, RN, ANP-C 06/28/2016 7:43 AM  [facility] [address] [place], [place]  [address] Phone: [phone] Fax: [phone]

## 2016-06-28 NOTE — Telephone Encounter (Signed)
S/w pt rescheduled appointment due to stomach bug.  Made pt new appointment, stated appreciation.

## 2016-07-06 NOTE — Progress Notes (Deleted)
CARDIOLOGY OFFICE NOTE  Date:  07/07/2016    [name] Date of Birth: November 15, 1955 Medical Record #[id number]  PCP:  [name], MD  Cardiologist:  [name]    No chief complaint on file.   History of Present Illness: [name] is a 60 y.o. female who presents today for a pre op clearance visit. Seen for Dr. Shirlee Latch.   She has a history of membranous VSD (small), cardiomyopathy (probably due to HTN, EF improved on most recent echo), and prior VT s/p ablation.   In 2/15, she had a Lexiscan Cardiolite showing EF 61% and a fixed small apical defect with no ischemia. Repeat echo in 2/15 showed EF 45-50% with small stable perimembranous VSD and and normal RV.   Seen back in March - had had a prior fall with right femur fracture in 11/15. This was surgically repaired. She was still smoking. She was no longer on Coreg. I last saw her a little over a year ago. She seemed to be getting motivated to take care of herself but was having more chest pain and we referred her on for cardiac cath - this showed mild nonobstructive CAD. Her chest pain was felt to not be cardiac.   Comes back today. Here alone.    PMH: 1. GERD 2. HTN 3. Membranous VSD: Small.  4. Scoliosis 5. VT: s/p VT ablation in the early 1990s in [place].  6. Cardiomyopathy: Possibly related to HTN. This improved with control of BP. Last echo in 3/13 showed EF 50-55%, small perimembranous VSD, mild LVH, mild AI, PA systolic pressure 32 mmHg. Echo (2/15) with EF 45-50%, stable small perimembranous VSD, normal RV size and systolic function, mild aortic insufficiency.  7. Type II diabetes 8. Atypical chest pain: Lexiscan Cardiolite (2/15) with fixed small apical defect, no ischemia (low risk).  9. Bilateral THR     Comes in today. Here with   Past Medical History:  Diagnosis Date  . Broken femur (HCC)    right  . Diabetes mellitus   . Hypertension   .  Paroxysmal ventricular tachycardia (HCC)   . Ventricular septal defect     Past Surgical History:  Procedure Laterality Date  . ABDOMINAL HYSTERECTOMY    . CARDIAC CATHETERIZATION N/A 06/23/2015   Procedure: Left Heart Cath and Coronary Angiography;  Surgeon: Laurey Morale, MD;  Location: [facility] INVASIVE CV LAB;  Service: Cardiovascular;  Laterality: N/A;  . EXAM UNDER ANESTHESIA WITH MANIPULATION OF HIP Right 06/02/2014   dr Jerl Santos  . HERNIA REPAIR    . HIP CLOSED REDUCTION Right 06/02/2014   Procedure: CLOSED MANIPULATION HIP;  Surgeon: Velna Ochs, MD;  Location: [facility] OR;  Service: Orthopedics;  Laterality: Right;  . JOINT REPLACEMENT    . SHOULDER ARTHROSCOPY     right  . TOTAL HIP ARTHROPLASTY Right 05/10/2014   [place]      by dr Carola Rhine     Medications: Current Outpatient Prescriptions  Medication Sig Dispense Refill  . amoxicillin (AMOXIL) 500 MG capsule Take 2,000 mg by mouth once. Take 4 capsules one hour prior to dental appointment    . aspirin 81 MG tablet Take 81 mg by mouth daily.      [name] atorvastatin (LIPITOR) 10 MG tablet Take 1 tablet (10 mg total) by mouth daily. 30 tablet 3  . buPROPion (WELLBUTRIN XL) 300 MG 24 hr tablet Take 300 mg by mouth daily.      . celecoxib (  CELEBREX) 200 MG capsule Take 200 mg by mouth daily.     . cephALEXin (KEFLEX) 500 MG capsule Take 1 capsule (500 mg total) by mouth 4 (four) times daily. 20 capsule 0  . fluvoxaMINE (LUVOX) 100 MG tablet Take 300 mg by mouth at bedtime.     . gabapentin (NEURONTIN) 300 MG capsule Take 600 mg by mouth at bedtime.     [name] levothyroxine (SYNTHROID, LEVOTHROID) 50 MCG tablet Take 50 mcg by mouth daily before breakfast.    . losartan-hydrochlorothiazide (HYZAAR) 100-12.5 MG per tablet Take 1 tablet by mouth daily. 30 tablet 9  . LYRICA 100 MG capsule Take 100 mg by mouth 2 (two) times daily.  1  . metFORMIN (GLUCOPHAGE) 1000 MG tablet Take 1,000 mg by mouth daily.      . Multiple Vitamin  (MULTIVITAMIN) tablet Take 1 tablet by mouth daily.      . nitroGLYCERIN (NITROSTAT) 0.4 MG SL tablet Place 1 tablet (0.4 mg total) under the tongue every 5 (five) minutes as needed for chest pain. 25 tablet 3  . omeprazole (PRILOSEC OTC) 20 MG tablet Take 20 mg by mouth daily.      [name] oxyCODONE-acetaminophen (PERCOCET) 5-325 MG per tablet Take 1 tablet by mouth every 6 (six) hours as needed for moderate pain.     [name] oxyCODONE-acetaminophen (PERCOCET) 7.5-325 MG tablet Take 1 tablet by mouth every 4 (four) hours as needed for moderate pain.   0  . QUEtiapine (SEROQUEL) 300 MG tablet Take 600 mg by mouth at bedtime.      . traZODone (DESYREL) 100 MG tablet Take 100 mg by mouth at bedtime.       No current facility-administered medications for this visit.     Allergies: No Known Allergies  Social History: The patient  reports that she has been smoking Cigarettes.  She has never used smokeless tobacco. She reports that she does not drink alcohol or use drugs.   Family History: The patient's family history includes Heart attack in her other; Other in her mother; Stroke (age of onset: 23) in her father.   Review of Systems: Please see the history of present illness.   Otherwise, the review of systems is positive for none.   All other systems are reviewed and negative.   Physical Exam: VS:  There were no vitals taken for this visit. [name]  BMI There is no height or weight on file to calculate BMI.  Wt Readings from Last 3 Encounters:  06/23/15 185 lb (83.9 kg)  06/18/15 196 lb 12.8 oz (89.3 kg)  01/11/15 180 lb (81.6 kg)    General: Pleasant. Well developed, well nourished and in no acute distress.   HEENT: Normal.  Neck: Supple, no JVD, carotid bruits, or masses noted.  Cardiac: Regular rate and rhythm. No murmurs, rubs, or gallops. No edema.  Respiratory:  Lungs are clear to auscultation bilaterally with normal work of breathing.  GI: Soft and nontender.  MS: No deformity or atrophy. Gait  and ROM intact.  Skin: Warm and dry. Color is normal.  Neuro:  Strength and sensation are intact and no gross focal deficits noted.  Psych: Alert, appropriate and with normal affect.   LABORATORY DATA:  EKG:  EKG is ordered today. This demonstrates .  Lab Results  Component Value Date   WBC 16.3 (H) 05/19/2016   HGB 14.3 05/19/2016   HCT 41.0 05/19/2016   PLT 456 (H) 05/19/2016   GLUCOSE 220 (H) 05/19/2016  CHOL 153 12/25/2014   TRIG 204.0 (H) 12/25/2014   HDL 39.40 12/25/2014   LDLDIRECT 73.0 12/25/2014   ALT 38 (H) 12/25/2014   AST 32 12/25/2014   NA 134 (L) 05/19/2016   K 4.4 05/19/2016   CL 101 05/19/2016   CREATININE 0.72 05/19/2016   BUN 9 05/19/2016   CO2 23 05/19/2016   INR 0.93 06/18/2015    BNP (last 3 results) No results for input(s): BNP in the last 8760 hours.  ProBNP (last 3 results) No results for input(s): PROBNP in the last 8760 hours.   Other Studies Reviewed Today:   Procedures   Left Heart Cath and Coronary Angiography 06/2015  Conclusion   Mild, nonobstructive CAD.  Suspect non-cardiac chest pain.       Echo Study Conclusions from 10/2014  - Left ventricle: The cavity size was normal. Systolic function was mildly reduced. The estimated ejection fraction was in the range of 45% to 50%. Wall motion was normal; there were no regional wall motion abnormalities. Doppler parameters are consistent with abnormal left ventricular relaxation (grade 1 diastolic dysfunction). - Ventricular septum: There was a small congenital ventricular septal defectin the perimembranous region. - Mitral valve: There was mild regurgitation. - Left atrium: The atrium was mildly dilated.  Impressions:  - Compared to the prior study, there has been no significant interval change.   Assessment/Plan: 1. Chest pain:  Lexiscan Cardiolite was low risk in 2/15 . Her symptoms are progressing with more frequency. Multiple risk factors that include  long standing smoking, HTN, HLD, DM and obesity. Will proceed with cardiac cath. The patient understands that risks include but are not limited to stroke (1 in 1000), death (1 in 1000), kidney failure [usually temporary] (1 in 500), bleeding (1 in 200), allergic reaction [possibly serious] (1 in 200), and agrees to proceed.   2. Smoking: I strongly encouraged her to quit smoking. Not sure she is quite ready to stop.  3. Hyperlipidemia: on statin therapy  4. Perimembranous VSD: Soft murmur noted. This is small and has been known for a long time. Most recent echo from April noted.   5. Cardiomyopathy: Possibly related to HTN. EF 45-50% on most recent echo, this is relatively stable. She remains on ACE and beta blocker therapy  6. HTN: BP controlled on current regimen.   7. H/o VT: s/p ablation. No symptomatic recurrence.   Current medicines are reviewed with the patient today.  The patient does not have concerns regarding medicines other than what has been noted above.  The following changes have been made:  See above.  Labs/ tests ordered today include:   No orders of the defined types were placed in this encounter.    Disposition:   FU with me in 1 year.   Patient is agreeable to this plan and will call if any problems develop in the interim.   Signed: Rosalio MacadamiaLori C. Arihant Pennings, RN, ANP-C 07/07/2016 1:40 PM  [facility] [address] [place], [place]  [address] Phone: [phone] Fax: [phone]

## 2016-07-07 ENCOUNTER — Ambulatory Visit: Payer: Medicare Other | Admitting: Nurse Practitioner

## 2016-07-28 ENCOUNTER — Ambulatory Visit: Payer: Medicare Other | Admitting: Nurse Practitioner

## 2016-07-28 NOTE — Progress Notes (Deleted)
CARDIOLOGY OFFICE NOTE  Date:  07/28/2016    [name] Date of Birth: [birth year] Medical Record #[id number]  PCP:  [name], MD  Cardiologist:  [name]    No chief complaint on file.   History of Present Illness: [name] is a 61 y.o. female who presents today for a follow up/pre op visit.  Seen for Dr. Shirlee Latch.   She has a history of membranous VSD (small), cardiomyopathy (probably due to HTN, EF improved on most recent echo), and prior VT s/p ablation.   In 2/15, she had a Lexiscan Cardiolite showing EF 61% and a fixed small apical defect with no ischemia. Repeat echo in 2/15 showed EF 45-50% with small stable perimembranous VSD and and normal RV.   Last seen by Dr. Shirlee Latch in March of 2016 - had had a prior fall with right femur fracture in 11/15. This was surgically repaired. She was still smoking. She was no longer on Coreg. I saw her back in November of 2016 - says she was reading to start taking care of herself but was having chest pain - lots of risk factors. Ended up being referred for cardiac cath.   Comes back today. Here alone.     PMH: 1. GERD 2. HTN 3. Membranous VSD: Small.  4. Scoliosis 5. VT: s/p VT ablation in the early 1990s in [place].  6. Cardiomyopathy: Possibly related to HTN. This improved with control of BP. Last echo in 3/13 showed EF 50-55%, small perimembranous VSD, mild LVH, mild AI, PA systolic pressure 32 mmHg. Echo (2/15) with EF 45-50%, stable small perimembranous VSD, normal RV size and systolic function, mild aortic insufficiency.  7. Type II diabetes 8. Atypical chest pain: Lexiscan Cardiolite (2/15) with fixed small apical defect, no ischemia (low risk).  9. Bilateral THR   Comes in today. Here with   Past Medical History:  Diagnosis Date  . Broken femur (HCC)    right  . Diabetes mellitus   . Hypertension   . Paroxysmal ventricular tachycardia (HCC)   . Ventricular  septal defect     Past Surgical History:  Procedure Laterality Date  . ABDOMINAL HYSTERECTOMY    . CARDIAC CATHETERIZATION N/A 06/23/2015   Procedure: Left Heart Cath and Coronary Angiography;  Surgeon: Laurey Morale, MD;  Location: [facility] INVASIVE CV LAB;  Service: Cardiovascular;  Laterality: N/A;  . EXAM UNDER ANESTHESIA WITH MANIPULATION OF HIP Right 06/02/2014   dr Jerl Santos  . HERNIA REPAIR    . HIP CLOSED REDUCTION Right 06/02/2014   Procedure: CLOSED MANIPULATION HIP;  Surgeon: Velna Ochs, MD;  Location: [facility] OR;  Service: Orthopedics;  Laterality: Right;  . JOINT REPLACEMENT    . SHOULDER ARTHROSCOPY     right  . TOTAL HIP ARTHROPLASTY Right 05/10/2014   [place]      by dr Carola Rhine     Medications: Current Outpatient Prescriptions  Medication Sig Dispense Refill  . amoxicillin (AMOXIL) 500 MG capsule Take 2,000 mg by mouth once. Take 4 capsules one hour prior to dental appointment    . aspirin 81 MG tablet Take 81 mg by mouth daily.      [name] atorvastatin (LIPITOR) 10 MG tablet Take 1 tablet (10 mg total) by mouth daily. 30 tablet 3  . buPROPion (WELLBUTRIN XL) 300 MG 24 hr tablet Take 300 mg by mouth daily.      . celecoxib (CELEBREX) 200 MG capsule Take 200 mg by mouth  daily.     . cephALEXin (KEFLEX) 500 MG capsule Take 1 capsule (500 mg total) by mouth 4 (four) times daily. 20 capsule 0  . fluvoxaMINE (LUVOX) 100 MG tablet Take 300 mg by mouth at bedtime.     . gabapentin (NEURONTIN) 300 MG capsule Take 600 mg by mouth at bedtime.     [name] levothyroxine (SYNTHROID, LEVOTHROID) 50 MCG tablet Take 50 mcg by mouth daily before breakfast.    . losartan-hydrochlorothiazide (HYZAAR) 100-12.5 MG per tablet Take 1 tablet by mouth daily. 30 tablet 9  . LYRICA 100 MG capsule Take 100 mg by mouth 2 (two) times daily.  1  . metFORMIN (GLUCOPHAGE) 1000 MG tablet Take 1,000 mg by mouth daily.      . Multiple Vitamin (MULTIVITAMIN) tablet Take 1 tablet by mouth daily.        . nitroGLYCERIN (NITROSTAT) 0.4 MG SL tablet Place 1 tablet (0.4 mg total) under the tongue every 5 (five) minutes as needed for chest pain. 25 tablet 3  . omeprazole (PRILOSEC OTC) 20 MG tablet Take 20 mg by mouth daily.      [name] oxyCODONE-acetaminophen (PERCOCET) 5-325 MG per tablet Take 1 tablet by mouth every 6 (six) hours as needed for moderate pain.     [name] oxyCODONE-acetaminophen (PERCOCET) 7.5-325 MG tablet Take 1 tablet by mouth every 4 (four) hours as needed for moderate pain.   0  . QUEtiapine (SEROQUEL) 300 MG tablet Take 600 mg by mouth at bedtime.      . traZODone (DESYREL) 100 MG tablet Take 100 mg by mouth at bedtime.       No current facility-administered medications for this visit.     Allergies: No Known Allergies  Social History: The patient  reports that she has been smoking Cigarettes.  She has never used smokeless tobacco. She reports that she does not drink alcohol or use drugs.   Family History: The patient's family history includes Heart attack in her other; Other in her mother; Stroke (age of onset: 51) in her father.   Review of Systems: Please see the history of present illness.   Otherwise, the review of systems is positive for none.   All other systems are reviewed and negative.   Physical Exam: VS:  There were no vitals taken for this visit. [name]  BMI There is no height or weight on file to calculate BMI.  Wt Readings from Last 3 Encounters:  06/23/15 185 lb (83.9 kg)  06/18/15 196 lb 12.8 oz (89.3 kg)  01/11/15 180 lb (81.6 kg)    General: Pleasant. Well developed, well nourished and in no acute distress.   HEENT: Normal.  Neck: Supple, no JVD, carotid bruits, or masses noted.  Cardiac: Regular rate and rhythm. No murmurs, rubs, or gallops. No edema.  Respiratory:  Lungs are clear to auscultation bilaterally with normal work of breathing.  GI: Soft and nontender.  MS: No deformity or atrophy. Gait and ROM intact.  Skin: Warm and dry. Color is normal.   Neuro:  Strength and sensation are intact and no gross focal deficits noted.  Psych: Alert, appropriate and with normal affect.   LABORATORY DATA:  EKG:  EKG is ordered today. This demonstrates .  Lab Results  Component Value Date   WBC 16.3 (H) 05/19/2016   HGB 14.3 05/19/2016   HCT 41.0 05/19/2016   PLT 456 (H) 05/19/2016   GLUCOSE 220 (H) 05/19/2016   CHOL 153 12/25/2014   TRIG 204.0 (H)  12/25/2014   HDL 39.40 12/25/2014   LDLDIRECT 73.0 12/25/2014   ALT 38 (H) 12/25/2014   AST 32 12/25/2014   NA 134 (L) 05/19/2016   K 4.4 05/19/2016   CL 101 05/19/2016   CREATININE 0.72 05/19/2016   BUN 9 05/19/2016   CO2 23 05/19/2016   INR 0.93 06/18/2015    BNP (last 3 results) No results for input(s): BNP in the last 8760 hours.  ProBNP (last 3 results) No results for input(s): PROBNP in the last 8760 hours.   Other Studies Reviewed Today:  Coronary Findings from 06/2015   Dominance: Right  Left Main  No significant disease.  Left Anterior Descending  30% mid LAD stenosis.  Left Circumflex  No significant disease.  Right Coronary Artery  Mild luminal irregularities.    Echo Study Conclusions from 10/2014  - Left ventricle: The cavity size was normal. Systolic function was mildly reduced. The estimated ejection fraction was in the range of 45% to 50%. Wall motion was normal; there were no regional wall motion abnormalities. Doppler parameters are consistent with abnormal left ventricular relaxation (grade 1 diastolic dysfunction). - Ventricular septum: There was a small congenital ventricular septal defectin the perimembranous region. - Mitral valve: There was mild regurgitation. - Left atrium: The atrium was mildly dilated.  Impressions:  - Compared to the prior study, there has been no significant interval change.   Assessment/Plan: 1. Pre op clearance -   2. Smoking:   3. Hyperlipidemia: on statin therapy  4. Perimembranous VSD:  Soft murmur noted. This is small and has been known for a long time. Last echo from 10/2014  5. Cardiomyopathy: Possibly related to HTN. EF 45-50% on most recent echo, this is relatively stable. She remains on ACE and beta blocker therapy  6. HTN: BP controlled on current regimen.   7. H/o VT: s/p ablation. No symptomatic recurrence.   Current medicines are reviewed with the patient today.  The patient does not have concerns regarding medicines other than what has been noted above.  The following changes have been made:  See above.  Labs/ tests ordered today include:   No orders of the defined types were placed in this encounter.    Disposition:   FU with *** in {gen number 1-61:096045} {Days to years:10300}.   Patient is agreeable to this plan and will call if any problems develop in the interim.   Signed: Rosalio Macadamia, RN, ANP-C 07/28/2016 1:38 PM  [facility] [address] [place], [place]  [address] Phone: [phone] Fax: [phone]

## 2016-08-02 ENCOUNTER — Telehealth: Payer: Self-pay | Admitting: Cardiology

## 2016-08-02 ENCOUNTER — Other Ambulatory Visit: Payer: Self-pay | Admitting: Orthopedic Surgery

## 2016-08-02 NOTE — Telephone Encounter (Signed)
Pt aware she has an appointment  with Sunday Spillers 08/10/16 for surgical clearance.   I left message for Albin Felling that pt has an appointment 08/10/16 for surgical clearance.

## 2016-08-02 NOTE — Telephone Encounter (Signed)
To  Dr McLean 

## 2016-08-02 NOTE — Telephone Encounter (Signed)
Request for surgical clearance:  1. What type of surgery is being performed? Left Shoulder Replacement   2. When is this surgery scheduled? 08-19-16   3. Are there any medications that need to be held prior to surgery and how long?General Cardiac Clearance and when does pt heed to stop her Aspirin?  4. Name of physician performing surgery? Dr Jones Broom   5. What is your office phone and fax number?[phone] and fax number is [phone] TGP:QDIYM

## 2016-08-02 NOTE — Telephone Encounter (Signed)
Has not been seen > 1 year, probably should be seen by PA to clear.

## 2016-08-02 NOTE — Telephone Encounter (Signed)
She has cancelled multiple times with me.

## 2016-08-09 NOTE — Progress Notes (Signed)
CARDIOLOGY OFFICE NOTE  Date:  08/10/2016    [name] Date of Birth: 09/17/1955 Medical Record #[id number]  PCP:  [name], MD  Cardiologist:  [name]    Chief Complaint  Patient presents with  . Pre-op Exam    Seen for Dr. Shirlee Latch    History of Present Illness: [name] is a 61 y.o. female who presents today for a pre op clearance visit. Seen for Dr. Shirlee Latch.   She has a history of membranous VSD (small), cardiomyopathy (probably due to HTN, EF improved on most recent echo), and prior VT s/p ablation.   In 2/15, she had a Lexiscan Cardiolite showing EF 61% and a fixed small apical defect with no ischemia. Repeat echo in 2/15 showed EF 45-50% with small stable perimembranous VSD and and normal RV.   Last seen by Dr. Shirlee Latch back in March of 2016 - had had a prior fall with right femur fracture in 11/15. This was surgically repaired. She was still smoking. She was no longer on Coreg.   I last saw her in November of 2016 - having more chest pain - lots of stress - referred on for cardiac cath - see below - mild nonobstructive disease.   Comes back today. Here alone. Needing surgical clearance for shoulder surgery with Dr. Ave Filter. Has cancelled this visit several times. Surgery scheduled for February 1st. She says she is doing ok. One 2 statins - she does not know which one she is actually taking. She says she has had only one episode of chest pain since the cath and that "was a long time ago". She is still smoking - 1/2 pack. She was sick before Christmas with some type of virus that was slow to go away - sounds like she had bronchitis. Very little residual cough now. Breathing is ok. Not exercising but tries to stay active. She is limited by her orthopedic issues. Has had a tendon pull loose from the left shoulder - fortunately this is the shoulder to be replaced.   PMH: 1. GERD 2. HTN 3. Membranous VSD: Small.  4.  Scoliosis 5. VT: s/p VT ablation in the early 1990s in [place].  6. Cardiomyopathy: Possibly related to HTN. This improved with control of BP. Last echo in 3/13 showed EF 50-55%, small perimembranous VSD, mild LVH, mild AI, PA systolic pressure 32 mmHg. Echo (2/15) with EF 45-50%, stable small perimembranous VSD, normal RV size and systolic function, mild aortic insufficiency.  7. Type II diabetes 8. Atypical chest pain: Lexiscan Cardiolite (2/15) with fixed small apical defect, no ischemia (low risk).  9. Bilateral THR   Past Medical History:  Diagnosis Date  . Broken femur (HCC)    right  . Diabetes mellitus   . Hypertension   . Paroxysmal ventricular tachycardia (HCC)   . Ventricular septal defect     Past Surgical History:  Procedure Laterality Date  . ABDOMINAL HYSTERECTOMY    . CARDIAC CATHETERIZATION N/A 06/23/2015   Procedure: Left Heart Cath and Coronary Angiography;  Surgeon: Laurey Morale, MD;  Location: [facility] INVASIVE CV LAB;  Service: Cardiovascular;  Laterality: N/A;  . EXAM UNDER ANESTHESIA WITH MANIPULATION OF HIP Right 06/02/2014   dr Jerl Santos  . HERNIA REPAIR    . HIP CLOSED REDUCTION Right 06/02/2014   Procedure: CLOSED MANIPULATION HIP;  Surgeon: Velna Ochs, MD;  Location: [facility] OR;  Service: Orthopedics;  Laterality: Right;  . JOINT REPLACEMENT    .  SHOULDER ARTHROSCOPY     right  . TOTAL HIP ARTHROPLASTY Right 05/10/2014   [place]      by dr Carola Rhine     Medications: Current Outpatient Prescriptions  Medication Sig Dispense Refill  . amoxicillin (AMOXIL) 500 MG capsule Take 2,000 mg by mouth once. Take 4 capsules one hour prior to dental appointment    . aspirin 81 MG tablet Take 81 mg by mouth daily.      [name] atorvastatin (LIPITOR) 10 MG tablet Take 1 tablet (10 mg total) by mouth daily. 30 tablet 3  . buPROPion (WELLBUTRIN XL) 300 MG 24 hr tablet Take 300 mg by mouth daily.      . carvedilol (COREG) 3.125 MG tablet Take 3.125 mg  by mouth 2 (two) times daily.  1  . celecoxib (CELEBREX) 200 MG capsule Take 200 mg by mouth daily.     . cephALEXin (KEFLEX) 500 MG capsule Take 1 capsule (500 mg total) by mouth 4 (four) times daily. 20 capsule 0  . fluvoxaMINE (LUVOX) 100 MG tablet Take 300 mg by mouth at bedtime.     . gabapentin (NEURONTIN) 300 MG capsule Take 600 mg by mouth at bedtime.     [name] levothyroxine (SYNTHROID, LEVOTHROID) 50 MCG tablet Take 50 mcg by mouth daily before breakfast.    . LORazepam (ATIVAN) 1 MG tablet Take 1-2 mg by mouth See admin instructions. One hour before dental appointment  0  . losartan-hydrochlorothiazide (HYZAAR) 100-12.5 MG per tablet Take 1 tablet by mouth daily. 30 tablet 9  . LYRICA 100 MG capsule Take 100 mg by mouth 2 (two) times daily.  1  . metFORMIN (GLUCOPHAGE) 1000 MG tablet Take 1,000 mg by mouth 2 (two) times daily.     . Multiple Vitamin (MULTIVITAMIN) tablet Take 1 tablet by mouth daily.      [name] omeprazole (PRILOSEC OTC) 20 MG tablet Take 20 mg by mouth every evening.     [name] oxyCODONE-acetaminophen (PERCOCET) 7.5-325 MG tablet Take 1 tablet by mouth every 4 (four) hours as needed for moderate pain.   0  . QUEtiapine (SEROQUEL) 300 MG tablet Take 600 mg by mouth at bedtime.      . rosuvastatin (CRESTOR) 20 MG tablet Take 20 mg by mouth at bedtime.  11   No current facility-administered medications for this visit.     Allergies: No Known Allergies  Social History: The patient  reports that she has been smoking Cigarettes.  She has never used smokeless tobacco. She reports that she does not drink alcohol or use drugs.   Family History: The patient's family history includes Heart attack in her other; Other in her mother; Stroke (age of onset: 69) in her father.   Review of Systems: Please see the history of present illness.   Otherwise, the review of systems is positive for none.   All other systems are reviewed and negative.   Physical Exam: VS:  BP 122/80   Pulse 95    Ht 5\' 4"  (1.626 m)   Wt 190 lb (86.2 kg)   BMI 32.61 kg/m  .  BMI Body mass index is 32.61 kg/m.  Wt Readings from Last 3 Encounters:  08/10/16 190 lb (86.2 kg)  06/23/15 185 lb (83.9 kg)  06/18/15 196 lb 12.8 oz (89.3 kg)    General: Pleasant. Obese female. Looks older than her stated age. She is alert and in no acute distress.  Little anxious.  HEENT: Normal.  Neck: Supple, no  JVD, carotid bruits, or masses noted.  Cardiac: Regular rate and rhythm. She has a murmur. No edema.  Respiratory:  Lungs are clear to auscultation bilaterally with normal work of breathing.  GI: Soft and nontender.  MS: No deformity or atrophy. Gait and ROM intact. Using a cane. Left arm with residual bruising from the tendon pulling away.  Skin: Warm and dry. Color is sallow.  Neuro:  Strength and sensation are intact and no gross focal deficits noted.  Psych: Alert, appropriate and with normal affect.   LABORATORY DATA:  EKG:  EKG is ordered today. This demonstrates NSR with LAFB - unchanged.  Lab Results  Component Value Date   WBC 16.3 (H) 05/19/2016   HGB 14.3 05/19/2016   HCT 41.0 05/19/2016   PLT 456 (H) 05/19/2016   GLUCOSE 220 (H) 05/19/2016   CHOL 153 12/25/2014   TRIG 204.0 (H) 12/25/2014   HDL 39.40 12/25/2014   LDLDIRECT 73.0 12/25/2014   ALT 38 (H) 12/25/2014   AST 32 12/25/2014   NA 134 (L) 05/19/2016   K 4.4 05/19/2016   CL 101 05/19/2016   CREATININE 0.72 05/19/2016   BUN 9 05/19/2016   CO2 23 05/19/2016   INR 0.93 06/18/2015    BNP (last 3 results) No results for input(s): BNP in the last 8760 hours.  ProBNP (last 3 results) No results for input(s): PROBNP in the last 8760 hours.   Other Studies Reviewed Today:  Cardiac Cath Conclusion from 06/2015   Mild, nonobstructive CAD.  Suspect non-cardiac chest pain    Echo Study Conclusions from 10/2014  - Left ventricle: The cavity size was normal. Systolic function was mildly reduced. The estimated ejection  fraction was in the range of 45% to 50%. Wall motion was normal; there were no regional wall motion abnormalities. Doppler parameters are consistent with abnormal left ventricular relaxation (grade 1 diastolic dysfunction). - Ventricular septum: There was a small congenital ventricular septal defectin the perimembranous region. - Mitral valve: There was mild regurgitation. - Left atrium: The atrium was mildly dilated.  Impressions:  - Compared to the prior study, there has been no significant interval change.   Assessment/Plan: 1. CAD - mild and nonobstructive by cardiac cath from 2016 - needs CV risk factor modification which has been challenging for her. She has no symptoms at this time.   2. Smoking: I strongly encouraged her to quit smoking. Still not sure she is quite ready to stop.  3. Hyperlipidemia: on statin therapy  4. Perimembranous VSD: Soft murmur noted. This is small and has been known for a long time. Most recent echo from April of 2016 noted. Needs updating.   5. Cardiomyopathy: Possibly related to HTN. EF 45-50% on last echo, this looks to be relatively stable. She remains on ACE and beta blocker therapy. Would like to get echo updated.   6. HTN: BP controlled on current regimen.   7. H/o VT: s/p ablation. No symptomatic recurrence.   8. Pre op clearance - she should be an acceptable candidate for her surgery. No current symptoms noted. Will be available as needed. Would be at increased risk for pulmonary complications. Strongly encouraged to work on smoking cessation. Our service will be available as needed. She is getting her echo updated tomorrow.   Current medicines are reviewed with the patient today.  The patient does not have concerns regarding medicines other than what has been noted above.  The following changes have been made:  See above.  Labs/  tests ordered today include:    Orders Placed This Encounter  Procedures  .  EKG 12-Lead  . ECHOCARDIOGRAM COMPLETE     Disposition:   FU with me in a year. Will be available as needed.     Patient is agreeable to this plan and will call if any problems develop in the interim.   SignedNorma Fredrickson, NP  08/10/2016 4:40 PM  [facility] [address] [place], [place]  [address] Phone: [phone] Fax: [phone]

## 2016-08-10 ENCOUNTER — Encounter: Payer: Self-pay | Admitting: Nurse Practitioner

## 2016-08-10 ENCOUNTER — Ambulatory Visit (INDEPENDENT_AMBULATORY_CARE_PROVIDER_SITE_OTHER): Payer: Medicare Other | Admitting: Nurse Practitioner

## 2016-08-10 VITALS — BP 122/80 | HR 95 | Ht 64.0 in | Wt 190.0 lb

## 2016-08-10 DIAGNOSIS — Q210 Ventricular septal defect: Secondary | ICD-10-CM | POA: Diagnosis not present

## 2016-08-10 DIAGNOSIS — Z01818 Encounter for other preprocedural examination: Secondary | ICD-10-CM

## 2016-08-10 DIAGNOSIS — Z01810 Encounter for preprocedural cardiovascular examination: Secondary | ICD-10-CM | POA: Diagnosis not present

## 2016-08-10 NOTE — Patient Instructions (Addendum)
We will be checking the following labs today - NONE   Medication Instructions:    Continue with your current medicines.     Testing/Procedures To Be Arranged:  N/A  Follow-Up:  Your physician wants you to follow-up in: 1 year for Dr. Shirlee Latch.  You will receive a reminder letter in the mail two months in advance. If you don't receive a letter, please call our office to schedule the follow-up appointment.       Other Special Instructions:   N/A    If you need a refill on your cardiac medications before your next appointment, please call your pharmacy.   Call the [facility] office at [phone] if you have any questions, problems or concerns.

## 2016-08-10 NOTE — Telephone Encounter (Signed)
Pt did see Lawson Fiscal for clearance 08/10/16

## 2016-08-11 ENCOUNTER — Other Ambulatory Visit: Payer: Self-pay

## 2016-08-11 ENCOUNTER — Ambulatory Visit (HOSPITAL_COMMUNITY): Payer: Medicare Other | Attending: Cardiology

## 2016-08-11 ENCOUNTER — Inpatient Hospital Stay (HOSPITAL_COMMUNITY): Admission: RE | Admit: 2016-08-11 | Payer: Medicare Other | Source: Ambulatory Visit

## 2016-08-11 ENCOUNTER — Telehealth: Payer: Self-pay | Admitting: *Deleted

## 2016-08-11 DIAGNOSIS — I351 Nonrheumatic aortic (valve) insufficiency: Secondary | ICD-10-CM | POA: Insufficient documentation

## 2016-08-11 DIAGNOSIS — Q210 Ventricular septal defect: Secondary | ICD-10-CM | POA: Insufficient documentation

## 2016-08-11 NOTE — Telephone Encounter (Signed)
Faxing to [facility] to [employer] @ [phone] surgical clearance paperwork.

## 2016-08-17 ENCOUNTER — Encounter (HOSPITAL_COMMUNITY)
Admission: RE | Admit: 2016-08-17 | Discharge: 2016-08-17 | Disposition: A | Discharge status: Home / Self Care | Payer: Medicare Other | Source: Ambulatory Visit | Attending: Orthopedic Surgery | Admitting: Orthopedic Surgery

## 2016-08-17 ENCOUNTER — Other Ambulatory Visit (HOSPITAL_COMMUNITY): Payer: Self-pay | Admitting: *Deleted

## 2016-08-17 ENCOUNTER — Encounter (HOSPITAL_COMMUNITY): Payer: Self-pay

## 2016-08-17 HISTORY — DX: Anxiety disorder, unspecified: F41.9

## 2016-08-17 HISTORY — DX: Thyrotoxicosis, unspecified without thyrotoxic crisis or storm: E05.90

## 2016-08-17 HISTORY — DX: Depression, unspecified: F32.A

## 2016-08-17 HISTORY — DX: Gastro-esophageal reflux disease without esophagitis: K21.9

## 2016-08-17 HISTORY — DX: Anemia, unspecified: D64.9

## 2016-08-17 HISTORY — DX: Major depressive disorder, single episode, unspecified: F32.9

## 2016-08-17 HISTORY — DX: Unspecified osteoarthritis, unspecified site: M19.90

## 2016-08-17 LAB — SURGICAL PCR SCREEN
MRSA, PCR: NEGATIVE
Staphylococcus aureus: NEGATIVE

## 2016-08-17 LAB — COMPREHENSIVE METABOLIC PANEL
ALT: 33 U/L (ref 14–54)
AST: 30 U/L (ref 15–41)
Albumin: 4.1 g/dL (ref 3.5–5.0)
Alkaline Phosphatase: 93 U/L (ref 38–126)
Anion gap: 11 (ref 5–15)
BILIRUBIN TOTAL: 0.5 mg/dL (ref 0.3–1.2)
BUN: 11 mg/dL (ref 6–20)
CALCIUM: 9 mg/dL (ref 8.9–10.3)
CO2: 24 mmol/L (ref 22–32)
Chloride: 99 mmol/L — ABNORMAL LOW (ref 101–111)
Creatinine, Ser: 0.87 mg/dL (ref 0.44–1.00)
GFR calc Af Amer: >60 mL/min (ref >60)
Glucose, Bld: 106 mg/dL — ABNORMAL HIGH (ref 65–99)
POTASSIUM: 3.7 mmol/L (ref 3.5–5.1)
Sodium: 134 mmol/L — ABNORMAL LOW (ref 135–145)
TOTAL PROTEIN: 7.3 g/dL (ref 6.5–8.1)

## 2016-08-17 LAB — URINALYSIS, ROUTINE W REFLEX MICROSCOPIC
BILIRUBIN URINE: NEGATIVE
Glucose, UA: NEGATIVE mg/dL
Hgb urine dipstick: NEGATIVE
KETONES UR: NEGATIVE mg/dL
NITRITE: NEGATIVE
PROTEIN: 30 mg/dL — AB
SPECIFIC GRAVITY, URINE: 1.014 (ref 1.005–1.030)
pH: 6 (ref 5.0–8.0)

## 2016-08-17 LAB — CBC WITH DIFFERENTIAL/PLATELET
Basophils Absolute: 0.1 10*3/uL (ref 0.0–0.1)
Basophils Relative: 1 %
Eosinophils Absolute: 0.4 10*3/uL (ref 0.0–0.7)
Eosinophils Relative: 3 %
HEMATOCRIT: 41 % (ref 36.0–46.0)
Hemoglobin: 14.3 g/dL (ref 12.0–15.0)
LYMPHS ABS: 2.9 10*3/uL (ref 0.7–4.0)
Lymphocytes Relative: 22 %
MCH: 33.3 pg (ref 26.0–34.0)
MCHC: 34.9 g/dL (ref 30.0–36.0)
MCV: 95.3 fL (ref 78.0–100.0)
MONO ABS: 0.8 10*3/uL (ref 0.1–1.0)
MONOS PCT: 6 %
NEUTROS ABS: 8.7 10*3/uL — AB (ref 1.7–7.7)
Neutrophils Relative %: 68 %
Platelets: 418 10*3/uL — ABNORMAL HIGH (ref 150–400)
RBC: 4.3 MIL/uL (ref 3.87–5.11)
RDW: 14 % (ref 11.5–15.5)
WBC: 12.9 10*3/uL — ABNORMAL HIGH (ref 4.0–10.5)

## 2016-08-17 LAB — TYPE AND SCREEN
ABO/RH(D): O POS
ANTIBODY SCREEN: NEGATIVE

## 2016-08-17 LAB — PROTIME-INR
INR: 0.93
Prothrombin Time: 12.5 seconds (ref 11.4–15.2)

## 2016-08-17 LAB — GLUCOSE, CAPILLARY: Glucose-Capillary: 130 mg/dL — ABNORMAL HIGH (ref 65–99)

## 2016-08-17 LAB — APTT: aPTT: 35 seconds (ref 24–36)

## 2016-08-17 LAB — ABO/RH: ABO/RH(D): O POS

## 2016-08-17 NOTE — Pre-Procedure Instructions (Signed)
[name]  08/17/2016      [id number] - [place], [place] - [address] AT [facility] [address] [place] [address] Phone: [phone] Fax: [phone]    Your procedure is scheduled on 08-19-2016   Thursday      Report to [facility] [place] Admitting at 5:30  A.M.   Call this number if you have problems the morning of surgery:  [phone]   Remember:  Do not eat food or drink liquids after midnight.   Take these medicines the morning of surgery with A SIP OF WATER Atorvastatin(Lipitor),Bupropion(Wellbutrin),Carvedilol(coreg),levothyroxine(Synthroid),Lyrcia,pain medication if needed,           STOP ASPIRIN,ANTIINFLAMATORIES (IBUPROFEN,ALEVE,MOTRIN,ADVIL,GOODY'S POWDERS),HERBAL SUPPLEMENTS,FISH OIL,AND VITAMINS 5-7 DAYS PRIOR TO SURGERY                   How to Manage Your Diabetes Before and After Surgery  Why is it important to control my blood sugar before and after surgery? . Improving blood sugar levels before and after surgery helps healing and can limit problems. . A way of improving blood sugar control is eating a healthy diet by: o  Eating less sugar and carbohydrates o  Increasing activity/exercise o  Talking with your doctor about reaching your blood sugar goals . High blood sugars (greater than 180 mg/dL) can raise your risk of infections and slow your recovery, so you will need to focus on controlling your diabetes during the weeks before surgery. . Make sure that the doctor who takes care of your diabetes knows about your planned surgery including the date and location.  How do I manage my blood sugar before surgery? . Check your blood sugar at least 4 times a day, starting 2 days before surgery, to make sure that the level is not too high or low. o Check your blood sugar the morning of your surgery when you wake up and every 2 hours until you get to the Short Stay unit. . If your blood  sugar is less than 70 mg/dL, you will need to treat for low blood sugar: o Do not take insulin. o Treat a low blood sugar (less than 70 mg/dL) with  cup of clear juice (cranberry or apple), 4 glucose tablets, OR glucose gel. o Recheck blood sugar in 15 minutes after treatment (to make sure it is greater than 70 mg/dL). If your blood sugar is not greater than 70 mg/dL on recheck, call [phone] for further instructions. . Report your blood sugar to the short stay nurse when you get to Short Stay.  . If you are admitted to the hospital after surgery: o Your blood sugar will be checked by the staff and you will probably be given insulin after surgery (instead of oral diabetes medicines) to make sure you have good blood sugar levels. o The goal for blood sugar control after surgery is 80-180 mg/dL.              WHAT DO I DO ABOUT MY DIABETES MEDICATION?   [name] Do not take oral diabetes medicines (pills) the morning of surgery  Reviewed and Endorsed by [facility] Patient Education Committee, August 2015   Do not wear jewelry, make-up or nail polish.  Do not wear lotions, powders, or perfumes, or deoderant.  Do not shave 48 hours prior to surgery.  \     Do not bring valuables to the hospital.  [facility] is not responsible for any  belongings or valuables.  Contacts, dentures or bridgework may not be worn into surgery.  Leave your suitcase in the car.  After surgery it may be brought to your room.  For patients admitted to the hospital, discharge time will be determined by your treatment team.  Patients discharged the day of surgery will not be allowed to drive home.    Special instructions:  See attached Sheet for instructions on CHG shower

## 2016-08-18 LAB — HEMOGLOBIN A1C
Hgb A1c MFr Bld: 6.5 % — ABNORMAL HIGH (ref 4.8–5.6)
MEAN PLASMA GLUCOSE: 140 mg/dL

## 2016-08-19 ENCOUNTER — Inpatient Hospital Stay (HOSPITAL_COMMUNITY): Payer: Medicare Other | Admitting: Anesthesiology

## 2016-08-19 ENCOUNTER — Inpatient Hospital Stay (HOSPITAL_COMMUNITY)
Admission: RE | Admit: 2016-08-19 | Discharge: 2016-08-20 | DRG: 483 | Disposition: A | Discharge status: Home / Self Care | Payer: Medicare Other | Source: Ambulatory Visit | Attending: Orthopedic Surgery | Admitting: Orthopedic Surgery

## 2016-08-19 ENCOUNTER — Encounter (HOSPITAL_COMMUNITY): Payer: Self-pay | Admitting: *Deleted

## 2016-08-19 ENCOUNTER — Encounter (HOSPITAL_COMMUNITY)
Admission: RE | Disposition: A | Discharge status: Home / Self Care | Payer: Self-pay | Source: Ambulatory Visit | Attending: Orthopedic Surgery

## 2016-08-19 ENCOUNTER — Inpatient Hospital Stay (HOSPITAL_COMMUNITY): Payer: Medicare Other

## 2016-08-19 DIAGNOSIS — Z01818 Encounter for other preprocedural examination: Secondary | ICD-10-CM

## 2016-08-19 DIAGNOSIS — F329 Major depressive disorder, single episode, unspecified: Secondary | ICD-10-CM | POA: Diagnosis present

## 2016-08-19 DIAGNOSIS — I10 Essential (primary) hypertension: Secondary | ICD-10-CM | POA: Diagnosis present

## 2016-08-19 DIAGNOSIS — K219 Gastro-esophageal reflux disease without esophagitis: Secondary | ICD-10-CM | POA: Diagnosis present

## 2016-08-19 DIAGNOSIS — E0590 Thyrotoxicosis, unspecified without thyrotoxic crisis or storm: Secondary | ICD-10-CM | POA: Diagnosis present

## 2016-08-19 DIAGNOSIS — Z90710 Acquired absence of both cervix and uterus: Secondary | ICD-10-CM

## 2016-08-19 DIAGNOSIS — E7800 Pure hypercholesterolemia, unspecified: Secondary | ICD-10-CM | POA: Diagnosis present

## 2016-08-19 DIAGNOSIS — E871 Hypo-osmolality and hyponatremia: Secondary | ICD-10-CM | POA: Diagnosis not present

## 2016-08-19 DIAGNOSIS — Z8249 Family history of ischemic heart disease and other diseases of the circulatory system: Secondary | ICD-10-CM | POA: Diagnosis not present

## 2016-08-19 DIAGNOSIS — F419 Anxiety disorder, unspecified: Secondary | ICD-10-CM | POA: Diagnosis present

## 2016-08-19 DIAGNOSIS — M19012 Primary osteoarthritis, left shoulder: Principal | ICD-10-CM | POA: Diagnosis present

## 2016-08-19 DIAGNOSIS — F17210 Nicotine dependence, cigarettes, uncomplicated: Secondary | ICD-10-CM | POA: Diagnosis present

## 2016-08-19 DIAGNOSIS — Z96612 Presence of left artificial shoulder joint: Secondary | ICD-10-CM

## 2016-08-19 DIAGNOSIS — Z79899 Other long term (current) drug therapy: Secondary | ICD-10-CM

## 2016-08-19 DIAGNOSIS — Z7984 Long term (current) use of oral hypoglycemic drugs: Secondary | ICD-10-CM

## 2016-08-19 DIAGNOSIS — Z7982 Long term (current) use of aspirin: Secondary | ICD-10-CM

## 2016-08-19 DIAGNOSIS — E119 Type 2 diabetes mellitus without complications: Secondary | ICD-10-CM | POA: Diagnosis present

## 2016-08-19 DIAGNOSIS — Q210 Ventricular septal defect: Secondary | ICD-10-CM | POA: Diagnosis not present

## 2016-08-19 DIAGNOSIS — M25512 Pain in left shoulder: Secondary | ICD-10-CM | POA: Diagnosis present

## 2016-08-19 HISTORY — DX: Personal history of other diseases of the digestive system: Z87.19

## 2016-08-19 HISTORY — DX: Urinary tract infection, site not specified: N39.0

## 2016-08-19 HISTORY — DX: Cardiac murmur, unspecified: R01.1

## 2016-08-19 HISTORY — DX: Pure hypercholesterolemia, unspecified: E78.00

## 2016-08-19 HISTORY — DX: Type 2 diabetes mellitus without complications: E11.9

## 2016-08-19 HISTORY — DX: Personal history of other medical treatment: Z92.89

## 2016-08-19 HISTORY — PX: TOTAL SHOULDER ARTHROPLASTY: SHX126

## 2016-08-19 LAB — GLUCOSE, CAPILLARY
GLUCOSE-CAPILLARY: 110 mg/dL — AB (ref 65–99)
GLUCOSE-CAPILLARY: 201 mg/dL — AB (ref 65–99)
GLUCOSE-CAPILLARY: 198 mg/dL — AB (ref 65–99)
Glucose-Capillary: 160 mg/dL — ABNORMAL HIGH (ref 65–99)
Glucose-Capillary: 144 mg/dL — ABNORMAL HIGH (ref 65–99)

## 2016-08-19 SURGERY — ARTHROPLASTY, SHOULDER, TOTAL
Anesthesia: General | Laterality: Left

## 2016-08-19 MED ORDER — DIPHENHYDRAMINE HCL 12.5 MG/5ML PO ELIX: 12.5000 mg | ORAL_SOLUTION | ORAL | Status: DC | PRN

## 2016-08-19 MED ORDER — POLYETHYLENE GLYCOL 3350 17 G PO PACK: 17.0000 g | PACK | Freq: Every day | ORAL | Status: DC | PRN

## 2016-08-19 MED ORDER — PANTOPRAZOLE SODIUM 40 MG PO TBEC
40.0000 mg | DELAYED_RELEASE_TABLET | Freq: Every evening | ORAL | Status: DC
  Administered 2016-08-19: 40 mg via ORAL
  Filled 2016-08-19: qty 1

## 2016-08-19 MED ORDER — PROPOFOL 10 MG/ML IV BOLUS
INTRAVENOUS | Status: DC | PRN
  Administered 2016-08-19: 50 mg via INTRAVENOUS
  Administered 2016-08-19: 150 mg via INTRAVENOUS

## 2016-08-19 MED ORDER — SODIUM CHLORIDE 0.9 % IV SOLN: INTRAVENOUS | Status: DC

## 2016-08-19 MED ORDER — GLYCOPYRROLATE 0.2 MG/ML IJ SOLN
INTRAMUSCULAR | Status: DC | PRN
  Administered 2016-08-19: .2 mg via INTRAVENOUS

## 2016-08-19 MED ORDER — PROMETHAZINE HCL 25 MG/ML IJ SOLN: 6.2500 mg | INTRAMUSCULAR | Status: DC | PRN

## 2016-08-19 MED ORDER — ATORVASTATIN CALCIUM 10 MG PO TABS: 10.0000 mg | ORAL_TABLET | Freq: Every day | ORAL | Status: DC

## 2016-08-19 MED ORDER — DOCUSATE SODIUM 100 MG PO CAPS
100.0000 mg | ORAL_CAPSULE | Freq: Two times a day (BID) | ORAL | Status: DC
  Administered 2016-08-19 – 2016-08-20 (×2): 100 mg via ORAL
  Filled 2016-08-19 (×2): qty 1

## 2016-08-19 MED ORDER — EPHEDRINE SULFATE 50 MG/ML IJ SOLN
INTRAMUSCULAR | Status: DC | PRN
  Administered 2016-08-19 (×3): 15 mg via INTRAVENOUS

## 2016-08-19 MED ORDER — MIDAZOLAM HCL 5 MG/5ML IJ SOLN
INTRAMUSCULAR | Status: DC | PRN
  Administered 2016-08-19: 2 mg via INTRAVENOUS

## 2016-08-19 MED ORDER — OXYCODONE HCL 5 MG PO TABS
5.0000 mg | ORAL_TABLET | ORAL | Status: DC | PRN
  Administered 2016-08-19: 10 mg via ORAL
  Administered 2016-08-19: 15 mg via ORAL
  Administered 2016-08-19: 10 mg via ORAL
  Administered 2016-08-20 (×3): 15 mg via ORAL
  Filled 2016-08-19 (×2): qty 3
  Filled 2016-08-19 (×2): qty 2
  Filled 2016-08-19 (×2): qty 3

## 2016-08-19 MED ORDER — 0.9 % SODIUM CHLORIDE (POUR BTL) OPTIME
TOPICAL | Status: DC | PRN
  Administered 2016-08-19: 1000 mL

## 2016-08-19 MED ORDER — HYDROCHLOROTHIAZIDE 12.5 MG PO CAPS
12.5000 mg | ORAL_CAPSULE | Freq: Every day | ORAL | Status: DC
  Administered 2016-08-20: 12.5 mg via ORAL
  Filled 2016-08-19: qty 1

## 2016-08-19 MED ORDER — PROPOFOL 10 MG/ML IV BOLUS
INTRAVENOUS | Status: AC
  Filled 2016-08-19: qty 20

## 2016-08-19 MED ORDER — LIDOCAINE HCL (CARDIAC) 20 MG/ML IV SOLN
INTRAVENOUS | Status: DC | PRN
  Administered 2016-08-19: 50 mg via INTRAVENOUS

## 2016-08-19 MED ORDER — GABAPENTIN 300 MG PO CAPS
600.0000 mg | ORAL_CAPSULE | Freq: Every day | ORAL | Status: DC
  Administered 2016-08-19: 600 mg via ORAL
  Filled 2016-08-19: qty 2

## 2016-08-19 MED ORDER — LORAZEPAM 1 MG PO TABS: 1.0000 mg | ORAL_TABLET | ORAL | Status: DC

## 2016-08-19 MED ORDER — PHENYLEPHRINE HCL 10 MG/ML IJ SOLN
INTRAMUSCULAR | Status: DC | PRN
  Administered 2016-08-19: 25 ug/min via INTRAVENOUS
  Administered 2016-08-19: 09:00:00 via INTRAVENOUS

## 2016-08-19 MED ORDER — BUPIVACAINE LIPOSOME 1.3 % IJ SUSP
20.0000 mL | Freq: Once | INTRAMUSCULAR | Status: DC
  Filled 2016-08-19: qty 20

## 2016-08-19 MED ORDER — METOCLOPRAMIDE HCL 5 MG/ML IJ SOLN: 5.0000 mg | Freq: Three times a day (TID) | INTRAMUSCULAR | Status: DC | PRN

## 2016-08-19 MED ORDER — OXYCODONE HCL 5 MG PO TABS
5.0000 mg | ORAL_TABLET | ORAL | Status: DC | PRN
  Administered 2016-08-19: 10 mg via ORAL
  Filled 2016-08-19: qty 2

## 2016-08-19 MED ORDER — MENTHOL 3 MG MT LOZG: 1.0000 | LOZENGE | OROMUCOSAL | Status: DC | PRN

## 2016-08-19 MED ORDER — MIDAZOLAM HCL 2 MG/2ML IJ SOLN
INTRAMUSCULAR | Status: AC
  Filled 2016-08-19: qty 2

## 2016-08-19 MED ORDER — SODIUM CHLORIDE 0.9 % IR SOLN
Status: DC | PRN
  Administered 2016-08-19: 3000 mL

## 2016-08-19 MED ORDER — PREGABALIN 100 MG PO CAPS
100.0000 mg | ORAL_CAPSULE | Freq: Two times a day (BID) | ORAL | Status: DC
  Administered 2016-08-19 – 2016-08-20 (×2): 100 mg via ORAL
  Filled 2016-08-19 (×2): qty 1

## 2016-08-19 MED ORDER — HYDROMORPHONE HCL 1 MG/ML IJ SOLN
INTRAMUSCULAR | Status: AC
  Filled 2016-08-19: qty 0.5

## 2016-08-19 MED ORDER — HYDROMORPHONE HCL 1 MG/ML IJ SOLN
0.2500 mg | INTRAMUSCULAR | Status: DC | PRN
  Administered 2016-08-19: 1 mg via INTRAVENOUS
  Administered 2016-08-19 (×2): 0.5 mg via INTRAVENOUS

## 2016-08-19 MED ORDER — CARVEDILOL 3.125 MG PO TABS
3.1250 mg | ORAL_TABLET | Freq: Two times a day (BID) | ORAL | Status: DC
  Administered 2016-08-19 – 2016-08-20 (×2): 3.125 mg via ORAL
  Filled 2016-08-19 (×2): qty 1

## 2016-08-19 MED ORDER — FENTANYL CITRATE (PF) 100 MCG/2ML IJ SOLN
INTRAMUSCULAR | Status: DC | PRN
  Administered 2016-08-19: 100 ug via INTRAVENOUS

## 2016-08-19 MED ORDER — QUETIAPINE FUMARATE 400 MG PO TABS
600.0000 mg | ORAL_TABLET | Freq: Every day | ORAL | Status: DC
  Administered 2016-08-19: 600 mg via ORAL
  Filled 2016-08-19: qty 2

## 2016-08-19 MED ORDER — METOCLOPRAMIDE HCL 5 MG PO TABS: 5.0000 mg | ORAL_TABLET | Freq: Three times a day (TID) | ORAL | Status: DC | PRN

## 2016-08-19 MED ORDER — SUCCINYLCHOLINE CHLORIDE 20 MG/ML IJ SOLN
INTRAMUSCULAR | Status: DC | PRN
  Administered 2016-08-19: 100 mg via INTRAVENOUS

## 2016-08-19 MED ORDER — ALUM & MAG HYDROXIDE-SIMETH 200-200-20 MG/5ML PO SUSP: 30.0000 mL | ORAL | Status: DC | PRN

## 2016-08-19 MED ORDER — ACETAMINOPHEN 500 MG PO TABS
1000.0000 mg | ORAL_TABLET | Freq: Four times a day (QID) | ORAL | Status: AC
  Administered 2016-08-19 – 2016-08-20 (×4): 1000 mg via ORAL
  Filled 2016-08-19 (×4): qty 2

## 2016-08-19 MED ORDER — CEFAZOLIN SODIUM-DEXTROSE 2-4 GM/100ML-% IV SOLN
2.0000 g | Freq: Three times a day (TID) | INTRAVENOUS | Status: AC
  Administered 2016-08-19 – 2016-08-20 (×3): 2 g via INTRAVENOUS
  Filled 2016-08-19 (×3): qty 100

## 2016-08-19 MED ORDER — LEVOTHYROXINE SODIUM 50 MCG PO TABS
50.0000 ug | ORAL_TABLET | Freq: Every day | ORAL | Status: DC
  Administered 2016-08-20: 50 ug via ORAL
  Filled 2016-08-19: qty 1

## 2016-08-19 MED ORDER — FENTANYL CITRATE (PF) 100 MCG/2ML IJ SOLN
INTRAMUSCULAR | Status: AC
  Filled 2016-08-19: qty 2

## 2016-08-19 MED ORDER — FLUVOXAMINE MALEATE 100 MG PO TABS
300.0000 mg | ORAL_TABLET | Freq: Every day | ORAL | Status: DC
  Administered 2016-08-19: 300 mg via ORAL
  Filled 2016-08-19: qty 3

## 2016-08-19 MED ORDER — FLEET ENEMA 7-19 GM/118ML RE ENEM: 1.0000 | ENEMA | Freq: Once | RECTAL | Status: DC | PRN

## 2016-08-19 MED ORDER — MORPHINE SULFATE (PF) 2 MG/ML IV SOLN
1.0000 mg | INTRAVENOUS | Status: DC | PRN
  Administered 2016-08-19 – 2016-08-20 (×3): 2 mg via INTRAVENOUS
  Filled 2016-08-19 (×3): qty 1

## 2016-08-19 MED ORDER — CEFAZOLIN SODIUM-DEXTROSE 2-4 GM/100ML-% IV SOLN
2.0000 g | INTRAVENOUS | Status: AC
  Administered 2016-08-19: 2 g via INTRAVENOUS
  Filled 2016-08-19: qty 100

## 2016-08-19 MED ORDER — BISACODYL 5 MG PO TBEC: 5.0000 mg | DELAYED_RELEASE_TABLET | Freq: Every day | ORAL | Status: DC | PRN

## 2016-08-19 MED ORDER — INSULIN ASPART 100 UNIT/ML ~~LOC~~ SOLN
0.0000 [IU] | Freq: Three times a day (TID) | SUBCUTANEOUS | Status: DC
  Administered 2016-08-19: 5 [IU] via SUBCUTANEOUS
  Administered 2016-08-20: 3 [IU] via SUBCUTANEOUS

## 2016-08-19 MED ORDER — LOSARTAN POTASSIUM-HCTZ 100-12.5 MG PO TABS: 1.0000 | ORAL_TABLET | Freq: Every day | ORAL | Status: DC

## 2016-08-19 MED ORDER — TRANEXAMIC ACID 1000 MG/10ML IV SOLN
1000.0000 mg | INTRAVENOUS | Status: DC
  Filled 2016-08-19: qty 10

## 2016-08-19 MED ORDER — SODIUM CHLORIDE 0.9% FLUSH
INTRAVENOUS | Status: DC | PRN
  Administered 2016-08-19: 60 mL via INTRAVENOUS

## 2016-08-19 MED ORDER — PHENYLEPHRINE HCL 10 MG/ML IJ SOLN
INTRAMUSCULAR | Status: DC | PRN
  Administered 2016-08-19 (×3): 120 ug via INTRAVENOUS

## 2016-08-19 MED ORDER — OXYCODONE HCL 5 MG PO TABS
ORAL_TABLET | ORAL | Status: AC
  Filled 2016-08-19: qty 2

## 2016-08-19 MED ORDER — BUPIVACAINE LIPOSOME 1.3 % IJ SUSP
INTRAMUSCULAR | Status: DC | PRN
  Administered 2016-08-19: 20 mL

## 2016-08-19 MED ORDER — METFORMIN HCL 500 MG PO TABS
1000.0000 mg | ORAL_TABLET | Freq: Two times a day (BID) | ORAL | Status: DC
  Administered 2016-08-19 – 2016-08-20 (×2): 1000 mg via ORAL
  Filled 2016-08-19 (×2): qty 2

## 2016-08-19 MED ORDER — ONDANSETRON HCL 4 MG PO TABS: 4.0000 mg | ORAL_TABLET | Freq: Four times a day (QID) | ORAL | Status: DC | PRN

## 2016-08-19 MED ORDER — ROSUVASTATIN CALCIUM 10 MG PO TABS
20.0000 mg | ORAL_TABLET | Freq: Every day | ORAL | Status: DC
  Administered 2016-08-19: 20 mg via ORAL
  Filled 2016-08-19: qty 2

## 2016-08-19 MED ORDER — LACTATED RINGERS IV SOLN
INTRAVENOUS | Status: DC | PRN
  Administered 2016-08-19 (×2): via INTRAVENOUS

## 2016-08-19 MED ORDER — ASPIRIN EC 325 MG PO TBEC
325.0000 mg | DELAYED_RELEASE_TABLET | Freq: Every day | ORAL | Status: DC
  Administered 2016-08-19 – 2016-08-20 (×2): 325 mg via ORAL
  Filled 2016-08-19 (×2): qty 1

## 2016-08-19 MED ORDER — PHENOL 1.4 % MT LIQD: 1.0000 | OROMUCOSAL | Status: DC | PRN

## 2016-08-19 MED ORDER — BUPIVACAINE-EPINEPHRINE (PF) 0.5% -1:200000 IJ SOLN
INTRAMUSCULAR | Status: DC | PRN
  Administered 2016-08-19: 25 mL via PERINEURAL

## 2016-08-19 MED ORDER — POVIDONE-IODINE 7.5 % EX SOLN
Freq: Once | CUTANEOUS | Status: DC
  Filled 2016-08-19: qty 118

## 2016-08-19 MED ORDER — LOSARTAN POTASSIUM 50 MG PO TABS
100.0000 mg | ORAL_TABLET | Freq: Every day | ORAL | Status: DC
  Administered 2016-08-20: 100 mg via ORAL
  Filled 2016-08-19: qty 2

## 2016-08-19 MED ORDER — HYDROMORPHONE HCL 1 MG/ML IJ SOLN
INTRAMUSCULAR | Status: AC
  Filled 2016-08-19: qty 2

## 2016-08-19 MED ORDER — BUPROPION HCL ER (XL) 150 MG PO TB24
300.0000 mg | ORAL_TABLET | Freq: Every day | ORAL | Status: DC
  Administered 2016-08-20: 300 mg via ORAL
  Filled 2016-08-19: qty 2

## 2016-08-19 MED ORDER — ONDANSETRON HCL 4 MG/2ML IJ SOLN: 4.0000 mg | Freq: Four times a day (QID) | INTRAMUSCULAR | Status: DC | PRN

## 2016-08-19 SURGICAL SUPPLY — 73 items
AID PSTN UNV HD RSTRNT DISP (MISCELLANEOUS) ×1
BIT DRILL 5/64X5 DISP (BIT) ×1 IMPLANT
BLADE SAW SAG 73X25 THK (BLADE) ×1
BLADE SAW SGTL 73X25 THK (BLADE) ×1 IMPLANT
BLADE SURG 15 STRL LF DISP TIS (BLADE) ×1 IMPLANT
BLADE SURG 15 STRL SS (BLADE) ×2
CAP SHOULDER TOTAL 2 ×1 IMPLANT
CEMENT BONE DEPUY (Cement) ×1 IMPLANT
CHLORAPREP W/TINT 26ML (MISCELLANEOUS) ×3 IMPLANT
COVER SURGICAL LIGHT HANDLE (MISCELLANEOUS) ×2 IMPLANT
DRAPE INCISE IOBAN 66X45 STRL (DRAPES) ×2 IMPLANT
DRAPE ORTHO SPLIT 77X108 STRL (DRAPES) ×4
DRAPE SURG 17X23 STRL (DRAPES) ×2 IMPLANT
DRAPE SURG ORHT 6 SPLT 77X108 (DRAPES) ×2 IMPLANT
DRAPE U-SHAPE 47X51 STRL (DRAPES) ×2 IMPLANT
DRSG AQUACEL AG ADV 3.5X10 (GAUZE/BANDAGES/DRESSINGS) ×1 IMPLANT
ELECT BLADE 4.0 EZ CLEAN MEGAD (MISCELLANEOUS)
ELECT REM PT RETURN 9FT ADLT (ELECTROSURGICAL) ×2
ELECTRODE BLDE 4.0 EZ CLN MEGD (MISCELLANEOUS) IMPLANT
ELECTRODE REM PT RTRN 9FT ADLT (ELECTROSURGICAL) ×1 IMPLANT
EVACUATOR 1/8 PVC DRAIN (DRAIN) IMPLANT
GLOVE BIO SURGEON STRL SZ7 (GLOVE) ×2 IMPLANT
GLOVE BIO SURGEON STRL SZ7.5 (GLOVE) ×2 IMPLANT
GLOVE BIOGEL PI IND STRL 6.5 (GLOVE) IMPLANT
GLOVE BIOGEL PI IND STRL 7.0 (GLOVE) ×1 IMPLANT
GLOVE BIOGEL PI IND STRL 8 (GLOVE) ×1 IMPLANT
GLOVE BIOGEL PI INDICATOR 6.5 (GLOVE) ×1
GLOVE BIOGEL PI INDICATOR 7.0 (GLOVE) ×2
GLOVE BIOGEL PI INDICATOR 8 (GLOVE) ×1
GLOVE SURG SS PI 7.0 STRL IVOR (GLOVE) ×3 IMPLANT
GOWN STRL REUS W/ TWL LRG LVL3 (GOWN DISPOSABLE) ×1 IMPLANT
GOWN STRL REUS W/ TWL XL LVL3 (GOWN DISPOSABLE) ×1 IMPLANT
GOWN STRL REUS W/TWL LRG LVL3 (GOWN DISPOSABLE) ×6
GOWN STRL REUS W/TWL XL LVL3 (GOWN DISPOSABLE) ×2
HANDPIECE INTERPULSE COAX TIP (DISPOSABLE) ×2
HEMOSTAT SURGICEL 2X14 (HEMOSTASIS) ×2 IMPLANT
HOOD PEEL AWAY FLYTE STAYCOOL (MISCELLANEOUS) ×4 IMPLANT
KIT BASIN OR (CUSTOM PROCEDURE TRAY) ×2 IMPLANT
KIT ROOM TURNOVER OR (KITS) ×2 IMPLANT
MANIFOLD NEPTUNE II (INSTRUMENTS) ×2 IMPLANT
NDL HYPO 25GX1X1/2 BEV (NEEDLE) IMPLANT
NDL MAYO TROCAR (NEEDLE) ×1 IMPLANT
NDL SPNL 18GX3.5 QUINCKE PK (NEEDLE) ×2 IMPLANT
NEEDLE HYPO 25GX1X1/2 BEV (NEEDLE) IMPLANT
NEEDLE MAYO TROCAR (NEEDLE) ×2 IMPLANT
NEEDLE SPNL 18GX3.5 QUINCKE PK (NEEDLE) ×4 IMPLANT
NS IRRIG 1000ML POUR BTL (IV SOLUTION) ×2 IMPLANT
PACK SHOULDER (CUSTOM PROCEDURE TRAY) ×2 IMPLANT
PAD ARMBOARD 7.5X6 YLW CONV (MISCELLANEOUS) ×4 IMPLANT
RESTRAINT HEAD UNIVERSAL NS (MISCELLANEOUS) ×2 IMPLANT
RETRIEVER SUT HEWSON (MISCELLANEOUS) ×2 IMPLANT
SET HNDPC FAN SPRY TIP SCT (DISPOSABLE) ×1 IMPLANT
SLING ARM IMMOBILIZER LRG (SOFTGOODS) ×2 IMPLANT
SMARTMIX MINI TOWER (MISCELLANEOUS) ×2
SPONGE LAP 18X18 X RAY DECT (DISPOSABLE) ×2 IMPLANT
SPONGE LAP 4X18 X RAY DECT (DISPOSABLE) IMPLANT
STRIP CLOSURE SKIN 1/2X4 (GAUZE/BANDAGES/DRESSINGS) ×2 IMPLANT
SUCTION FRAZIER HANDLE 10FR (MISCELLANEOUS) ×1
SUCTION TUBE FRAZIER 10FR DISP (MISCELLANEOUS) ×1 IMPLANT
SUPPORT WRAP ARM LG (MISCELLANEOUS) ×2 IMPLANT
SUT ETHIBOND NAB CT1 #1 30IN (SUTURE) ×6 IMPLANT
SUT MNCRL AB 4-0 PS2 18 (SUTURE) ×2 IMPLANT
SUT SILK 2 0 TIES 17X18 (SUTURE)
SUT SILK 2-0 18XBRD TIE BLK (SUTURE) IMPLANT
SUT VIC AB 2-0 CT1 27 (SUTURE) ×2
SUT VIC AB 2-0 CT1 TAPERPNT 27 (SUTURE) ×1 IMPLANT
SYR 50ML LL SCALE MARK (SYRINGE) ×4 IMPLANT
SYR CONTROL 10ML LL (SYRINGE) IMPLANT
TAPE LABRALWHITE 1.5X36 (TAPE) ×4 IMPLANT
TAPE SUT LABRALTAP WHT/BLK (SUTURE) ×2 IMPLANT
TOWEL OR 17X24 6PK STRL BLUE (TOWEL DISPOSABLE) ×1 IMPLANT
TOWEL OR 17X26 10 PK STRL BLUE (TOWEL DISPOSABLE) ×1 IMPLANT
TOWER SMARTMIX MINI (MISCELLANEOUS) ×1 IMPLANT

## 2016-08-19 NOTE — Anesthesia Preprocedure Evaluation (Addendum)
Anesthesia Evaluation  Patient identified by MRN, date of birth, ID band Patient awake    Reviewed: Allergy & Precautions, H&P , NPO status , Patient's Chart, lab work & pertinent test results  Airway Mallampati: II  TM Distance: <3 FB Neck ROM: Full    Dental  (+) Teeth Intact, Dental Advisory Given   Pulmonary Current Smoker,    breath sounds clear to auscultation       Cardiovascular hypertension,  Rhythm:Regular Rate:Normal     Neuro/Psych    GI/Hepatic GERD  Medicated and Controlled,  Endo/Other  diabetesMorbid obesity  Renal/GU      Musculoskeletal   Abdominal (+) + obese,   Peds  Hematology  (+) anemia ,   Anesthesia Other Findings - Normal LV size with EF 45-50%, diffuse hypokinesis. Normal RV size and systolic function. There was a small peri-membranous VSD with left to right flow, this appears restrictive. Trivial aortic insufficiency. Currently has hard candy in mouth.  Reproductive/Obstetrics                          Anesthesia Physical Anesthesia Plan  ASA: III  Anesthesia Plan: General   Post-op Pain Management:  Regional for Post-op pain   Induction: Intravenous  Airway Management Planned: Oral ETT  Additional Equipment:   Intra-op Plan:   Post-operative Plan:   Informed Consent: I have reviewed the patients History and Physical, chart, labs and discussed the procedure including the risks, benefits and alternatives for the proposed anesthesia with the patient or authorized representative who has indicated his/her understanding and acceptance.   Dental Advisory Given  Plan Discussed with: Anesthesiologist, CRNA and Surgeon  Anesthesia Plan Comments:        Anesthesia Quick Evaluation

## 2016-08-19 NOTE — Discharge Instructions (Signed)
Discharge Instructions after Total Shoulder Arthroplasty ° ° °A sling has been provided for you. Remove the sling 5 times each day to perform motion exercises. After the first 48 to 72 hours, discontinue using the sling. You should use the sling as a protective device, if you are in a crowd.  °Use ice on the shoulder intermittently over the first 48 hours after surgery.  °Pain medication has been prescribed for you.  °Use your medication liberally over the first 48 hours, and then begin to taper your use. You may take Extra Strength Tylenol or Tylenol only in place of the pain pills. DO NOT take ANY nonsteroidal anti-inflammatory pain medications: Advil, Motrin, Ibuprofen, Aleve, Naproxen, or Naprosyn. °Take one aspirin a day for 2 weeks after surgery, unless you have an aspirin sensitivity/allergy or asthma. °Leave your dressing on until your first follow up visit.  You may shower with the dressing.  Hold your arm as if you still have your sling on while you shower. °Active reaching and lifting are not permitted. You may use the operative arm for activities of daily living that do not require the operative arm to leave the side of the body, such as eating, drinking, bathing, etc.  °Three to 5 times each day you should perform assisted overhead reaching and external rotation (outward turning) exercises with the operative arm. You were taught these exercises prior to discharge. Both exercises should be done with the non-operative arm used as the "therapist arm" while the operative arm remains relaxed. Ten of each exercise should be done three to five times each day. ° ° °Overhead reach is helping to lift your stiff arm up as high as it will go. To stretch your overhead reach, lie flat on your back, relax, and grasp the wrist of the tight shoulder with your opposite hand. Using the power in your opposite arm, bring the stiff arm up as far as it is comfortable. Start holding it for ten seconds and then work up to where  you can hold it for a count of 30. Breathe slowly and deeply while the arm is moved. Repeat this stretch ten times, trying to help the ar up a little higher each time.  ° ° ° °External rotation is turning the arm out to the side while your elbow stays close to your body. External rotation is best stretched while you are lying on your back. Hold a cane, yardstick, broom handle, or dowel in both hands. Bend both elbows to a right angle. Use steady, gentle force from your normal arm to rotate the hand of the stiff shoulder out away from your body. Continue the rotation as far as it will go comfortably, holding it there for a count of 10. Repeat this exercise ten times.  ° ° ° ° °Please call [phone] during normal business hours or [phone] after hours for any problems. Including the following: ° °- excessive redness of the incisions °- drainage for more than 4 days °- fever of more than 101.5 F ° °*Please note that pain medications will not be refilled after hours or on weekends. ° ° ° °

## 2016-08-19 NOTE — Transfer of Care (Signed)
Immediate Anesthesia Transfer of Care Note  Patient: [name]  Procedure(s) Performed: Procedure(s) with comments: TOTAL SHOULDER ARTHROPLASTY (Left) - Left total shoulder replacement  Patient Location: PACU  Anesthesia Type:General  Level of Consciousness: awake, alert  and oriented  Airway & Oxygen Therapy: Patient Spontanous Breathing and Patient connected to nasal cannula oxygen  Post-op Assessment: Report given to RN, Post -op Vital signs reviewed and stable and Patient moving all extremities X 4  Post vital signs: Reviewed and stable  Last Vitals:  Vitals:   08/19/16 0604 08/19/16 0606  BP: (!) 80/65 99/65  Pulse: (!) 103   Resp: 20   Temp: 36.9 C     Last Pain:  Vitals:   08/19/16 0614  TempSrc:   PainSc: 7          Complications: No apparent anesthesia complications

## 2016-08-19 NOTE — Op Note (Signed)
Procedure(s): TOTAL SHOULDER ARTHROPLASTY Procedure Note  [name] female 61 y.o. 08/19/2016  Procedure(s) and Anesthesia Type:    *  LEFT TOTAL SHOULDER ARTHROPLASTY - Choice  Surgeon(s) and Role:    * Jones Broom, MD - Primary   Indications:  61 y.o. female  With endstage left shoulder arthritis. Pain and dysfunction interfered with quality of life and nonoperative treatment with activity modification, NSAIDS and injections failed.     Surgeon: Mable Paris   Assistants: Damita Lack PA-C [facility] was present and scrubbed throughout the procedure and was essential in positioning, retraction, exposure, and closure)  Anesthesia: General endotracheal anesthesia with preoperative interscalene block given by the attending anesthesiologist    Procedure Detail  TOTAL SHOULDER ARTHROPLASTY  Findings: Tornier flex anatomic press-fit size to stem with a 43 head, cemented size 30 small Cortiloc glenoid.   A lesser tuberosity osteotomy was performed and repaired at the conclusion of the procedure.  Estimated Blood Loss:  200 mL         Drains: None   Blood Given: none          Specimens: none        Complications:  * No complications entered in OR log *         Disposition: PACU - hemodynamically stable.         Condition: stable    Procedure:   The patient was identified in the preoperative holding area where I personally marked the operative extremity after verifying with the patient and consent. She  was taken to the operating room where She was transferred to the   operative table.  The patient received an interscalene block in   the holding area by the attending anesthesiologist.  General anesthesia was induced   in the operating room without complication.  The patient did receive IV  Ancef prior to the commencement of the procedure.  The patient received 1 g IV tranexamic acid at the start of the case around time of the incision. The  patient was   placed in the beach-chair position with the back raised about 30   degrees.  The nonoperative extremity and head and neck were carefully   positioned and padded protecting against neurovascular compromise.  The   left upper extremity was then prepped and draped in the standard sterile   fashion.    The appropriate operative time-out was performed with   Anesthesia, the perioperative staff, as well as myself and we all agreed   that the left side was the correct operative site.  An approximately   10 cm incision was made from the tip of the coracoid to the center point of the   humerus at the level of the axilla.  Dissection was carried down sharply   through subcutaneous tissues and cephalic vein was identified and taken   laterally with the deltoid.  The pectoralis major was taken medially.  The   upper 1 cm of the pectoralis major was released from its attachment on   the humerus.  The clavipectoral fascia was incised just lateral to the   conjoined tendon.  This incision was carried up to but not into the   coracoacromial ligament.  Digital palpation was used to prove   integrity of the axillary nerve which was protected throughout the   procedure.  Musculocutaneous nerve was not palpated in the operative   field.  Conjoined tendon was then retracted gently medially and the   deltoid laterally.  Anterior circumflex humeral vessels were clamped and   coagulated.  The soft tissues overlying the biceps was incised and this   incision was carried across the transverse humeral ligament to the base   of the coracoid.  The biceps was tenodesed to the soft tissue just above   pectoralis major and the remaining portion of the biceps superiorly was   excised.  An osteotomy was performed at the lesser tuberosity.  Capsule was then   released all the way down to the 6 o'clock position of the humeral head.   The humeral head was then delivered with simultaneous adduction,   extension  and external rotation.  All humeral osteophytes were removed   and the anatomic neck of the humerus was marked and cut free hand at   approximately 25 degrees retroversion within about 3 mm of the cuff   reflection posteriorly.  The head size was estimated to be a 43 medium   offset.  At that point, the humeral head was retracted posteriorly with   a Fukuda retractor.   Remaining portion of the capsule was released at the base of the   coracoid.  The remaining biceps anchor and the entire anterior-inferior   labrum was excised.  The posterior labrum was also excised but the   posterior capsule was not released.  The guidepin was placed bicortically with 0 elevated guide.  The reamer was used to ream to concentric bone with punctate bleeding.  This gave an excellent concentric surface.  The center hole was then drilled for an anchor peg glenoid followed by the three peripheral holes and none of the holes   exited the glenoid wall.  I then pulse irrigated these holes and dried   them with Surgicel.  The three peripheral holes were then   pressurized cemented and the anchor peg glenoid was placed and impacted   with an excellent fit.  The glenoid was a 30S component.  The proximal humerus was then again exposed taking care not to displace the glenoid.    The entry awl was used followed by sounding reamers and then sequentially broached from size 1 to 2. This was then left in place and the calcar planer was used. Trial head was placed with a 43.  With the trial implantation of the component,  there was approximately 50% posterior translation with immediate snap back to the   anatomic position.  With forward elevation, there was no tendency   towards posterior subluxation.   The trial was removed and the final implant was prepared on a back table.  The trial was removed and the final implant was prepared on a back table.   3 small holes were drilled on the medial side of the lesser tuberosity osteotomy,  through which 2 labral tapes were passed. The implant was then placed through the loop of the 2 labral tapes and impacted with an excellent press-fit. This achieved excellent anatomic reconstruction of the proximal humerus.  The joint was then copiously irrigated with pulse lavage.  The subscapularis and   lesser tuberosity osteotomy were then repaired using the 2 labral tapes previously passed in a double row fashion with horizontal mattress sutures medially brought over through bone tunnels tied over a bone bridge laterally.   One #1 Ethibond was placed at the rotator interval just above   the lesser tuberosity. Copious irrigation was used. Skin was closed with 2-0 Vicryl sutures in the deep dermal layer and 4-0 Monocryl in a  subcuticular  running fashion.  Sterile dressings were then applied including Aquacel.  The patient was placed in a sling and allowed to awaken from general anesthesia and taken to the recovery room in stable condition.      POSTOPERATIVE PLAN:  Early passive range of motion will be allowed with the goal of 0 degrees external rotation and 90 degrees forward elevation.  No internal rotation at this time.  No active motion of the arm until the lesser tuberosity heals.  The patient will likely be kept in the hospital for 1-2 days and then discharged home.

## 2016-08-19 NOTE — H&P (Signed)
[name] is an 61 y.o. female.   Chief Complaint: L shoulder pain and dysfunction HPI: Endstage Left shoulder arthritis with significant pain and dysfunction, failed conservative measures.  Pain interferes with sleep and quality of life.  Past Medical History:  Diagnosis Date  . Anemia   . Anxiety   . Arthritis   . Broken femur (HCC)    right  . Depression   . Diabetes mellitus   . GERD (gastroesophageal reflux disease)   . Hypertension   . Hyperthyroidism   . Paroxysmal ventricular tachycardia (HCC)   . UTI (urinary tract infection)    being treated with Keflex  . Ventricular septal defect     Past Surgical History:  Procedure Laterality Date  . ABDOMINAL HYSTERECTOMY    . CARDIAC CATHETERIZATION N/A 06/23/2015   Procedure: Left Heart Cath and Coronary Angiography;  Surgeon: Laurey Morale, MD;  Location: [facility] INVASIVE CV LAB;  Service: Cardiovascular;  Laterality: N/A;  . EXAM UNDER ANESTHESIA WITH MANIPULATION OF HIP Right 06/02/2014   dr Jerl Santos  . FRACTURE SURGERY Right    femur  . HERNIA REPAIR    . HIP CLOSED REDUCTION Right 06/02/2014   Procedure: CLOSED MANIPULATION HIP;  Surgeon: Velna Ochs, MD;  Location: [facility] OR;  Service: Orthopedics;  Laterality: Right;  . JOINT REPLACEMENT Bilateral    Hip  . JOINT REPLACEMENT Left    knee  . SHOULDER ARTHROSCOPY     right  . SHOULDER OPEN ROTATOR CUFF REPAIR Right   . TOTAL HIP ARTHROPLASTY Right 05/10/2014   [place]      by dr Carola Rhine    Family History  Problem Relation Age of Onset  . Stroke Father 82    deceased  . Other Mother     alive  . Heart attack Other     multiple uncles have died with myocardial infarction   Social History:  reports that she has been smoking Cigarettes.  She has a 25.00 pack-year smoking history. She has never used smokeless tobacco. She reports that she does not drink alcohol or use drugs.  Allergies:  Allergies  Allergen Reactions  . No Known  Allergies     Medications Prior to Admission  Medication Sig Dispense Refill  . aspirin 81 MG tablet Take 81 mg by mouth daily.      [name] atorvastatin (LIPITOR) 10 MG tablet Take 1 tablet (10 mg total) by mouth daily. 30 tablet 3  . buPROPion (WELLBUTRIN XL) 300 MG 24 hr tablet Take 300 mg by mouth daily.      . carvedilol (COREG) 3.125 MG tablet Take 3.125 mg by mouth 2 (two) times daily.  1  . celecoxib (CELEBREX) 200 MG capsule Take 200 mg by mouth daily.     . cephALEXin (KEFLEX) 500 MG capsule Take 1 capsule (500 mg total) by mouth 4 (four) times daily. 20 capsule 0  . fluvoxaMINE (LUVOX) 100 MG tablet Take 300 mg by mouth at bedtime.     . gabapentin (NEURONTIN) 300 MG capsule Take 600 mg by mouth at bedtime.     [name] levothyroxine (SYNTHROID, LEVOTHROID) 50 MCG tablet Take 50 mcg by mouth daily before breakfast.    . LORazepam (ATIVAN) 1 MG tablet Take 1-2 mg by mouth See admin instructions. One hour before dental appointment  0  . losartan-hydrochlorothiazide (HYZAAR) 100-12.5 MG per tablet Take 1 tablet by mouth daily. 30 tablet 9  . LYRICA 100 MG capsule Take 100 mg by  mouth 2 (two) times daily.  1  . metFORMIN (GLUCOPHAGE) 1000 MG tablet Take 1,000 mg by mouth 2 (two) times daily.     . Multiple Vitamin (MULTIVITAMIN) tablet Take 1 tablet by mouth daily.      [name] omeprazole (PRILOSEC OTC) 20 MG tablet Take 20 mg by mouth every evening.     [name] oxyCODONE-acetaminophen (PERCOCET) 7.5-325 MG tablet Take 1 tablet by mouth every 4 (four) hours as needed for moderate pain.   0  . QUEtiapine (SEROQUEL) 300 MG tablet Take 600 mg by mouth at bedtime.      . rosuvastatin (CRESTOR) 20 MG tablet Take 20 mg by mouth at bedtime.  11  . amoxicillin (AMOXIL) 500 MG capsule Take 2,000 mg by mouth once. Take 4 capsules one hour prior to dental appointment      Results for orders placed or performed during the hospital encounter of 08/19/16 (from the past 48 hour(s))  Glucose, capillary     Status: Abnormal    Collection Time: 08/19/16  6:10 AM  Result Value Ref Range   Glucose-Capillary 160 (H) 65 - 99 mg/dL   No results found.  Review of Systems  All other systems reviewed and are negative.   Blood pressure 99/65, pulse (!) 103, temperature 98.4 F (36.9 C), temperature source Oral, resp. rate 20, height 5\' 2"  (1.575 m), weight 87.2 kg (192 lb 5 oz), SpO2 97 %. Physical Exam  Constitutional: She is oriented to person, place, and time. She appears well-developed and well-nourished.  HENT:  Head: Atraumatic.  Eyes: EOM are normal.  Cardiovascular: Intact distal pulses.   Respiratory: Effort normal.  Musculoskeletal:  L shoulder pain with limited ROM  Neurological: She is alert and oriented to person, place, and time.  Skin: Skin is warm and dry.  Psychiatric: She has a normal mood and affect.     Assessment/Plan. Endstage left shoulder arthritis with significant pain and dysfunction, failed conservative measures.  Pain interferes with sleep and quality of life. Plan L TSA Risks / benefits of surgery discussed Consent on chart  NPO for OR Preop antibiotics       Mable Paris, MD 08/19/2016, 7:17 AM

## 2016-08-19 NOTE — Anesthesia Postprocedure Evaluation (Addendum)
Anesthesia Post Note  Patient: [name]  Procedure(s) Performed: Procedure(s) (LRB): TOTAL SHOULDER ARTHROPLASTY (Left)  Patient location during evaluation: PACU Anesthesia Type: General and Regional Level of consciousness: awake and alert Pain management: satisfactory to patient Vital Signs Assessment: post-procedure vital signs reviewed and stable Respiratory status: spontaneous breathing, nonlabored ventilation, respiratory function stable and patient connected to nasal cannula oxygen Cardiovascular status: blood pressure returned to baseline and stable Postop Assessment: no signs of nausea or vomiting Anesthetic complications: no       Last Vitals:  Vitals:   08/19/16 1100 08/19/16 1200  BP: 107/74 105/72  Pulse: (!) 102 (!) 102  Resp: 15 18  Temp: 36.6 C     Last Pain:  Vitals:   08/19/16 1200  TempSrc:   PainSc: 6                  Laruth Hanger,JAMES TERRILL

## 2016-08-19 NOTE — Anesthesia Procedure Notes (Signed)
Procedure Name: Intubation Date/Time: 08/19/2016 7:35 AM Performed by: Neldon Newport Pre-anesthesia Checklist: Timeout performed, Patient being monitored, Suction available, Emergency Drugs available and Patient identified Patient Re-evaluated:Patient Re-evaluated prior to inductionOxygen Delivery Method: Circle system utilized Preoxygenation: Pre-oxygenation with 100% oxygen Intubation Type: IV induction and Rapid sequence Ventilation: Mask ventilation without difficulty Laryngoscope Size: Mac and 3 Grade View: Grade I Tube type: Oral Tube size: 7.0 mm Number of attempts: 1 Placement Confirmation: breath sounds checked- equal and bilateral,  positive ETCO2 and ETT inserted through vocal cords under direct vision Secured at: 21 cm Tube secured with: Tape Dental Injury: Teeth and Oropharynx as per pre-operative assessment

## 2016-08-19 NOTE — Anesthesia Procedure Notes (Addendum)
Anesthesia Regional Block:  Interscalene brachial plexus block  Pre-Anesthetic Checklist: ,, timeout performed, Correct Patient, Correct Site, Correct Laterality, Correct Procedure, Correct Position, site marked, Risks and benefits discussed,  Surgical consent,  Pre-op evaluation,  At surgeon's request and post-op pain management  Laterality: Upper and Left  Prep: chloraprep       Needles:  Injection technique: Single-shot  Needle Type: Echogenic Stimulator Needle     Needle Length: 9cm 9 cm Needle Gauge: 21 and 21 G  Needle insertion depth: 5 cm   Additional Needles:  Procedures: ultrasound guided (picture in chart) and nerve stimulator Interscalene brachial plexus block  Nerve Stimulator or Paresthesia:  Response: Twitch elicited, 0.5 mA, 0.3 ms,   Additional Responses:   Narrative:  Start time: 08/19/2016 7:00 AM End time: 08/19/2016 7:15 AM Injection made incrementally with aspirations every 5 mL.  Performed by: Personally  Anesthesiologist: Shatera Rennert  Additional Notes: Block assessed prior to start of surgery

## 2016-08-20 ENCOUNTER — Encounter (HOSPITAL_COMMUNITY): Payer: Self-pay | Admitting: *Deleted

## 2016-08-20 LAB — CBC
HEMATOCRIT: 34 % — AB (ref 36.0–46.0)
HEMOGLOBIN: 11.5 g/dL — AB (ref 12.0–15.0)
MCH: 31.8 pg (ref 26.0–34.0)
MCHC: 33.8 g/dL (ref 30.0–36.0)
MCV: 93.9 fL (ref 78.0–100.0)
Platelets: 287 10*3/uL (ref 150–400)
RBC: 3.62 MIL/uL — ABNORMAL LOW (ref 3.87–5.11)
RDW: 13.8 % (ref 11.5–15.5)
WBC: 12.9 10*3/uL — ABNORMAL HIGH (ref 4.0–10.5)

## 2016-08-20 LAB — BASIC METABOLIC PANEL
Anion gap: 13 (ref 5–15)
BUN: 8 mg/dL (ref 6–20)
CALCIUM: 8.8 mg/dL — AB (ref 8.9–10.3)
CHLORIDE: 92 mmol/L — AB (ref 101–111)
CO2: 23 mmol/L (ref 22–32)
CREATININE: 0.95 mg/dL (ref 0.44–1.00)
GFR calc Af Amer: >60 mL/min (ref >60)
GFR calc non Af Amer: >60 mL/min (ref >60)
GLUCOSE: 163 mg/dL — AB (ref 65–99)
Potassium: 3.3 mmol/L — ABNORMAL LOW (ref 3.5–5.1)
Sodium: 128 mmol/L — ABNORMAL LOW (ref 135–145)

## 2016-08-20 LAB — GLUCOSE, CAPILLARY: Glucose-Capillary: 167 mg/dL — ABNORMAL HIGH (ref 65–99)

## 2016-08-20 MED ORDER — OXYCODONE-ACETAMINOPHEN 7.5-325 MG PO TABS: 1.0000 | ORAL_TABLET | ORAL | 0 refills | Status: DC | PRN

## 2016-08-20 NOTE — Progress Notes (Signed)
   PATIENT ID: [name]   1 Day Post-Op Procedure(s) (LRB): TOTAL SHOULDER ARTHROPLASTY (Left)  Subjective: Doing well, needed IV pain medication when the block wore off. States it is improving and she feels like she can go home today.  Objective:  Vitals:   08/20/16 0012 08/20/16 0505  BP: 94/64 131/85  Pulse: 77 71  Resp: 15 16  Temp: 98.5 F (36.9 C) 98.1 F (36.7 C)     L UE dressing c/d/i Wiggles fingers, distally NVI, no distal swelling  Labs:   Recent Labs  08/17/16 1618 08/20/16 0559  HGB 14.3 11.5*   Recent Labs  08/17/16 1618 08/20/16 0559  WBC 12.9* 12.9*  RBC 4.30 3.62*  HCT 41.0 34.0*  PLT 418* 287   Recent Labs  08/17/16 1618 08/20/16 0559  NA 134* 128*  K 3.7 3.3*  CL 99* 92*  CO2 24 23  BUN 11 8  CREATININE 0.87 0.95  GLUCOSE 106* 163*  CALCIUM 9.0 8.8*    Assessment and Plan: 1 day s/p left TSA A bit hyponatremic, patient states this is baseline and she has a plan to drink electrolytes, she will follow up with PCP or nephrologist about this. For now, limit/hold off on H20 and IV Ween off IV pain meds, will go home on percocet 7.5mg  2 po q 4 hrs OT- PROM limited to 90 FF and 0 ER D/c home today Follow up with Dr. Ave Filter in 2 weeks  VTE proph: ASA, SCDs

## 2016-08-20 NOTE — Evaluation (Signed)
Occupational Therapy Evaluation and Discharge Patient Details Name: [name] MRN: [id number] DOB: 1956/06/08 Today's Date: 08/20/2016    History of Present Illness Pt is a 61 y/o female post op LEFT total shoulder arthroplasty. Pt has a past medical history including Anxiety; Arthritis; Depression; Heart murmur; Hypertension; Hyperthyroidism; Paroxysmal ventricular tachycardia; Type II diabetes mellitus; and Ventricular septal defect. Pt has a past surgical history that includes Shoulder arthroscopy (Right); Total hip arthroplasty (Right, 05/10/2014); Shoulder open rotator cuff repair (Right); Total shoulder arthroplasty (Left, 08/19/2016); Total knee arthroplasty (Left); Total knee arthroplasty (Left); Back surgery; Spinal fusion (1996); total abdominal hysterectomy; Cardiac catheterization (06/23/2015); and Refractive surgery (Bilateral).   Clinical Impression   PTA Pt independent in ADL and mobility. Pt currently max A for BUE ADL and min guard/supervision for mobility. Pt education complete. Reviewed entire OT shoulder handout and performed HEP with Pt stressing PASSIVE movement. Pt verbalized and demonstrated understanding. Pt with no questions or concerns at the end of session. OT to sign off at this point. Thank you for this referral.     Follow Up Recommendations  No OT follow up;Supervision - Intermittent    Equipment Recommendations  None recommended by OT    Recommendations for Other Services Other (comment) (progress rehab of shoulder as ordered by MD at follow up)     Precautions / Restrictions Precautions Precautions: Shoulder Type of Shoulder Precautions: Passive Protocol Shoulder Interventions: Shoulder sling/immobilizer;At all times;Off for dressing/bathing/exercises Precaution Booklet Issued: Yes (comment) Precaution Comments: OT shoulder handout reviewed in full Required Braces or Orthoses: Sling Restrictions Weight Bearing Restrictions: Yes LUE Weight  Bearing: Non weight bearing      Mobility Bed Mobility Overal bed mobility: Needs Assistance Bed Mobility: Supine to Sit     Supine to sit: Mod assist;HOB elevated     General bed mobility comments: HHA to elevate trunk   Transfers Overall transfer level: Modified independent Equipment used: None             General transfer comment: sit <> stand x 4 during session, no attempt to use operated arm to assist    Balance Overall balance assessment: No apparent balance deficits (not formally assessed)                                          ADL Overall ADL's : Needs assistance/impaired                         Toilet Transfer: Min guard;Ambulation;Comfort height toilet Toilet Transfer Details (indicate cue type and reason): talked about putting BSC over toilet at home with Pt  Toileting- Clothing Manipulation and Hygiene: Modified independent;Sit to/from stand Toileting - Clothing Manipulation Details (indicate cue type and reason): hospital gown and peri care Tub/ Shower Transfer: Walk-in shower;Min guard;Ambulation;Shower seat   Functional mobility during ADLs: Min guard (for safety) General ADL Comments: Please see shoulder section below for more information on ADL performance     Vision Vision Assessment?: No apparent visual deficits   Perception     Praxis      Pertinent Vitals/Pain Pain Assessment: 0-10 Pain Score: 8  Pain Location: L shoulder Pain Descriptors / Indicators: Throbbing;Grimacing;Guarding;Sore Pain Intervention(s): Limited activity within patient's tolerance;Monitored during session;Premedicated before session;Ice applied     Hand Dominance Right   Extremity/Trunk Assessment Upper Extremity Assessment Upper Extremity Assessment: LUE deficits/detail LUE  Deficits / Details: post op deficits in strength and ROM   Lower Extremity Assessment Lower Extremity Assessment: Overall WFL for tasks assessed (history of  hip and knee surgeries, but WFL)   Cervical / Trunk Assessment Cervical / Trunk Assessment: Other exceptions Cervical / Trunk Exceptions: history of back surgery   Communication Communication Communication: No difficulties   Cognition Arousal/Alertness: Awake/alert Behavior During Therapy: WFL for tasks assessed/performed Overall Cognitive Status: Within Functional Limits for tasks assessed                     General Comments       Exercises Exercises: Shoulder     Shoulder Instructions Shoulder Instructions Donning/doffing shirt without moving shoulder: Maximal assistance;Caregiver independent with task;Patient able to independently direct caregiver Method for sponge bathing under operated UE: Modified independent Donning/doffing sling/immobilizer: Maximal assistance;Patient able to independently direct caregiver Correct positioning of sling/immobilizer: Modified independent ROM for elbow, wrist and digits of operated UE: Modified independent Sling wearing schedule (on at all times/off for ADL's): Modified independent Proper positioning of operated UE when showering: Modified independent Positioning of UE while sleeping: Minimal assistance;Patient able to independently direct caregiver (to position pillows around)    Home Living Family/patient expects to be discharged to:: Private residence Living Arrangements: Spouse/significant other Available Help at Discharge: Family;Available PRN/intermittently Type of Home: House Home Access: Ramped entrance     Home Layout: One level     Bathroom Shower/Tub: Producer, television/film/video: Handicapped height Bathroom Accessibility: Yes   Home Equipment: Bedside commode;Shower seat          Prior Functioning/Environment Level of Independence: Independent                 OT Problem List: Decreased strength;Decreased range of motion;Decreased activity tolerance;Decreased knowledge of precautions;Impaired UE  functional use;Pain   OT Treatment/Interventions:      OT Goals(Current goals can be found in the care plan section) Acute Rehab OT Goals Patient Stated Goal: to get home OT Goal Formulation: With patient Time For Goal Achievement: 08/27/16 Potential to Achieve Goals: Good  OT Frequency:     Barriers to D/C:            Co-evaluation              End of Session Equipment Utilized During Treatment: Other (comment) (sling) Nurse Communication: Mobility status;Weight bearing status;Other (comment) (in room, OT education complete)  Activity Tolerance: Patient tolerated treatment well Patient left: in bed;with call bell/phone within reach;with nursing/sitter in room   Time: 0952-1030 OT Time Calculation (min): 38 min Charges:  OT General Charges $OT Visit: 1 Procedure OT Evaluation $OT Eval Moderate Complexity: 1 Procedure OT Treatments $Self Care/Home Management : 8-22 mins $Therapeutic Exercise: 8-22 mins G-Codes:    Evern Bio Dontea Corlew 12-Sep-2016, 11:07 AM Sherryl Manges OTR/L [phone]

## 2016-08-20 NOTE — Progress Notes (Signed)
Reviewed discharge instructions/medication with patient.  Answered all of her questions.  Patient is drinking gatorade and waiting on husband to pick her up.

## 2016-08-20 NOTE — Progress Notes (Signed)
Ordered Gatorade for patient.  Waiting for it to come up.

## 2016-09-06 NOTE — Discharge Summary (Signed)
Patient ID: BILLY ROCCO MRN: [id number] DOB/AGE: 1955-08-29 61 y.o.  Admit date: 08/19/2016 Discharge date: 08/20/2016  Admission Diagnoses:  Active Problems:   S/P shoulder replacement, left   Discharge Diagnoses:  Same  Past Medical History:  Diagnosis Date  . Anemia   . Anxiety   . Arthritis   . Depression   . GERD (gastroesophageal reflux disease)   . Heart murmur    "related to VSD"  . High cholesterol   . History of blood transfusion    "related to OR" (08/19/2016)  . History of hiatal hernia   . Hypertension   . Hyperthyroidism   . Paroxysmal ventricular tachycardia (HCC)   . Type II diabetes mellitus (HCC)   . UTI (urinary tract infection)    being treated with Keflex  . Ventricular septal defect     Surgeries: Procedure(s): TOTAL SHOULDER ARTHROPLASTY on 08/19/2016   Consultants:   Discharged Condition: Improved  Hospital Course: ORETHA WEISMANN is an 61 y.o. female who was admitted 08/19/2016 for operative treatment of left shoulder end stage OA. Patient has severe unremitting pain that affects sleep, daily activities, and work/hobbies. After pre-op clearance the patient was taken to the operating room on 08/19/2016 and underwent  Procedure(s): TOTAL SHOULDER ARTHROPLASTY.    Patient was given perioperative antibiotics:  Anti-infectives    Start     Dose/Rate Route Frequency Ordered Stop   08/19/16 1600  ceFAZolin (ANCEF) IVPB 2g/100 mL premix     2 g 200 mL/hr over 30 Minutes Intravenous Every 8 hours 08/19/16 1356 08/20/16 0816   08/19/16 0529  ceFAZolin (ANCEF) IVPB 2g/100 mL premix     2 g 200 mL/hr over 30 Minutes Intravenous On call to O.R. 08/19/16 7829 08/19/16 0746       Patient was given sequential compression devices, early ambulation, and asa to prevent DVT.  Patient benefited maximally from hospital stay and there were no complications.    Recent vital signs: No data found.    Recent laboratory studies: No results for input(s):  WBC, HGB, HCT, PLT, NA, K, CL, CO2, BUN, CREATININE, GLUCOSE, INR, CALCIUM in the last 72 hours.  Invalid input(s): PT, 2   Discharge Medications:   Allergies as of 08/20/2016      Reactions   No Known Allergies       Medication List    STOP taking these medications   celecoxib 200 MG capsule Commonly known as:  CELEBREX     TAKE these medications   amoxicillin 500 MG capsule Commonly known as:  AMOXIL Take 2,000 mg by mouth once. Take 4 capsules one hour prior to dental appointment   aspirin 81 MG tablet Take 81 mg by mouth daily.   atorvastatin 10 MG tablet Commonly known as:  LIPITOR Take 1 tablet (10 mg total) by mouth daily.   buPROPion 300 MG 24 hr tablet Commonly known as:  WELLBUTRIN XL Take 300 mg by mouth daily.   carvedilol 3.125 MG tablet Commonly known as:  COREG Take 3.125 mg by mouth 2 (two) times daily.   cephALEXin 500 MG capsule Commonly known as:  KEFLEX Take 1 capsule (500 mg total) by mouth 4 (four) times daily.   fluvoxaMINE 100 MG tablet Commonly known as:  LUVOX Take 300 mg by mouth at bedtime.   gabapentin 300 MG capsule Commonly known as:  NEURONTIN Take 600 mg by mouth at bedtime.   levothyroxine 50 MCG tablet Commonly known as:  SYNTHROID, LEVOTHROID Take 50  mcg by mouth daily before breakfast.   LORazepam 1 MG tablet Commonly known as:  ATIVAN Take 1-2 mg by mouth See admin instructions. One hour before dental appointment   losartan-hydrochlorothiazide 100-12.5 MG tablet Commonly known as:  HYZAAR Take 1 tablet by mouth daily.   LYRICA 100 MG capsule Generic drug:  pregabalin Take 100 mg by mouth 2 (two) times daily.   metFORMIN 1000 MG tablet Commonly known as:  GLUCOPHAGE Take 1,000 mg by mouth 2 (two) times daily.   multivitamin tablet Take 1 tablet by mouth daily.   omeprazole 20 MG tablet Commonly known as:  PRILOSEC OTC Take 20 mg by mouth every evening.   oxyCODONE-acetaminophen 7.5-325 MG tablet Commonly  known as:  PERCOCET Take 1-2 tablets by mouth every 4 (four) hours as needed for moderate pain. What changed:  how much to take   rosuvastatin 20 MG tablet Commonly known as:  CRESTOR Take 20 mg by mouth at bedtime.   SEROQUEL 300 MG tablet Generic drug:  QUEtiapine Take 600 mg by mouth at bedtime.       Diagnostic Studies: Dg Shoulder Left Port  Result Date: 08/19/2016 CLINICAL DATA:  Post left total shoulder arthroplasty EXAM: LEFT SHOULDER - 1 VIEW COMPARISON:  MR left shoulder of 11/20/2013 FINDINGS: A single frontal view shows the prosthetic left humeral head to be in good position with no complicating features. Left AC joint is normally aligned. IMPRESSION: Left shoulder arthroplasty with no complicating features. Electronically Signed   By: Dwyane Dee M.D.   On: 08/19/2016 10:26    Disposition: 01-Home or Self Care  Discharge Instructions    Call MD / Call 911    Complete by:  As directed    If you experience chest pain or shortness of breath, CALL 911 and be transported to the hospital emergency room.  If you develope a fever above 101 F, pus (white drainage) or increased drainage or redness at the wound, or calf pain, call your surgeon's office.   Constipation Prevention    Complete by:  As directed    Drink plenty of fluids.  Prune juice may be helpful.  You may use a stool softener, such as Colace (over the counter) 100 mg twice a day.  Use MiraLax (over the counter) for constipation as needed.   Diet - low sodium heart healthy    Complete by:  As directed    Increase activity slowly as tolerated    Complete by:  As directed       Follow-up Information    Mable Paris, MD. Schedule an appointment as soon as possible for a visit in 2 weeks.   Specialty:  Orthopedic Surgery Contact information: [address] [place] [address] [phone]            Signed: Jiles Harold 09/06/2016, 1:18 PM

## 2016-10-20 ENCOUNTER — Other Ambulatory Visit: Payer: Self-pay | Admitting: Family Medicine

## 2016-10-20 DIAGNOSIS — Z1231 Encounter for screening mammogram for malignant neoplasm of breast: Secondary | ICD-10-CM

## 2016-11-26 ENCOUNTER — Ambulatory Visit: Payer: Medicare Other

## 2016-12-02 ENCOUNTER — Ambulatory Visit: Payer: Medicare Other

## 2016-12-17 NOTE — Addendum Note (Signed)
Addendum  created 12/17/16 1019 by Sharee Holster, MD   Sign clinical note

## 2016-12-28 ENCOUNTER — Ambulatory Visit: Payer: Medicare Other

## 2017-01-07 ENCOUNTER — Other Ambulatory Visit: Payer: Self-pay | Admitting: Family Medicine

## 2017-01-07 DIAGNOSIS — R1310 Dysphagia, unspecified: Secondary | ICD-10-CM

## 2017-01-12 ENCOUNTER — Other Ambulatory Visit: Payer: Self-pay | Admitting: Pain Medicine

## 2017-01-12 DIAGNOSIS — M545 Low back pain: Secondary | ICD-10-CM

## 2017-02-01 ENCOUNTER — Ambulatory Visit
Admission: RE | Admit: 2017-02-01 | Discharge: 2017-02-01 | Disposition: A | Discharge status: Home / Self Care | Payer: Medicare Other | Source: Ambulatory Visit | Attending: Family Medicine | Admitting: Family Medicine

## 2017-02-01 ENCOUNTER — Ambulatory Visit
Admission: RE | Admit: 2017-02-01 | Discharge: 2017-02-01 | Disposition: A | Discharge status: Home / Self Care | Payer: Medicare Other | Source: Ambulatory Visit | Attending: Pain Medicine | Admitting: Pain Medicine

## 2017-02-01 DIAGNOSIS — R1310 Dysphagia, unspecified: Secondary | ICD-10-CM

## 2017-02-01 DIAGNOSIS — M545 Low back pain: Secondary | ICD-10-CM

## 2017-02-01 MED ORDER — GADOBENATE DIMEGLUMINE 529 MG/ML IV SOLN
17.0000 mL | Freq: Once | INTRAVENOUS | Status: AC | PRN
  Administered 2017-02-01: 17 mL via INTRAVENOUS

## 2017-02-23 ENCOUNTER — Ambulatory Visit
Admission: RE | Admit: 2017-02-23 | Discharge: 2017-02-23 | Disposition: A | Discharge status: Home / Self Care | Payer: Medicare Other | Source: Ambulatory Visit | Attending: Family Medicine | Admitting: Family Medicine

## 2017-02-23 DIAGNOSIS — Z1231 Encounter for screening mammogram for malignant neoplasm of breast: Secondary | ICD-10-CM

## 2017-08-09 ENCOUNTER — Ambulatory Visit: Payer: Medicare Other | Admitting: Nurse Practitioner

## 2017-08-09 NOTE — Progress Notes (Deleted)
CARDIOLOGY OFFICE NOTE  Date:  08/09/2017    [name] Date of Birth: 09-26-1955 Medical Record #[id number]  PCP:  Elias Else, MD  Cardiologist:  Tyrone Sage & ***    No chief complaint on file.   History of Present Illness: [name] is a 62 y.o. female who presents today for a ***   Comes in today. Here with   Past Medical History:  Diagnosis Date  . Anemia   . Anxiety   . Arthritis   . Depression   . GERD (gastroesophageal reflux disease)   . Heart murmur    "related to VSD"  . High cholesterol   . History of blood transfusion    "related to OR" (08/19/2016)  . History of hiatal hernia   . Hypertension   . Hyperthyroidism   . Paroxysmal ventricular tachycardia (HCC)   . Type II diabetes mellitus (HCC)   . UTI (urinary tract infection)    being treated with Keflex  . Ventricular septal defect     Past Surgical History:  Procedure Laterality Date  . BACK SURGERY    . CARDIAC CATHETERIZATION N/A 06/23/2015   Procedure: Left Heart Cath and Coronary Angiography;  Surgeon: Laurey Morale, MD;  Location: [facility] INVASIVE CV LAB;  Service: Cardiovascular;  Laterality: N/A;  . CARDIAC CATHETERIZATION  1960   "VSD was so small; didn't need repaired"  . EXAM UNDER ANESTHESIA WITH MANIPULATION OF HIP Right 06/02/2014   dr Jerl Santos  . FRACTURE SURGERY    . HERNIA REPAIR    . HIP CLOSED REDUCTION Right 06/02/2014   Procedure: CLOSED MANIPULATION HIP;  Surgeon: Velna Ochs, MD;  Location: [facility] OR;  Service: Orthopedics;  Laterality: Right;  . JOINT REPLACEMENT    . JOINT REPLACEMENT    . REFRACTIVE SURGERY Bilateral   . SHOULDER ARTHROSCOPY Right   . SHOULDER OPEN ROTATOR CUFF REPAIR Right   . SPINAL FUSION  1996   "t10 down to my coccyx  . SPINE HARDWARE REMOVAL    . TOTAL ABDOMINAL HYSTERECTOMY    . TOTAL HIP ARTHROPLASTY Right 05/10/2014   [place]      by dr Carola Rhine  . TOTAL KNEE ARTHROPLASTY Left   . TOTAL KNEE  ARTHROPLASTY Left   . TOTAL SHOULDER ARTHROPLASTY Left 08/19/2016  . TOTAL SHOULDER ARTHROPLASTY Left 08/19/2016   Procedure: TOTAL SHOULDER ARTHROPLASTY;  Surgeon: Jones Broom, MD;  Location: [facility] OR;  Service: Orthopedics;  Laterality: Left;  Left total shoulder replacement     Medications: No outpatient medications have been marked as taking for the 08/09/17 encounter (Appointment) with Rosalio Macadamia, NP.     Allergies: Allergies  Allergen Reactions  . No Known Allergies     Social History: The patient  reports that she has been smoking cigarettes.  She has a 25.00 pack-year smoking history. she has never used smokeless tobacco. She reports that she does not drink alcohol or use drugs.   Family History: The patient's ***family history includes Heart attack in her other; Other in her mother; Stroke (age of onset: 80) in her father.   Review of Systems: Please see the history of present illness.   Otherwise, the review of systems is positive for {NONE DEFAULTED:18576::"none"}.   All other systems are reviewed and negative.   Physical Exam: VS:  There were no vitals taken for this visit. [name]  BMI There is no height or weight on file to calculate BMI.  Wt Readings  from Last 3 Encounters:  08/19/16 192 lb 5 oz (87.2 kg)  08/17/16 192 lb 4.8 oz (87.2 kg)  08/10/16 190 lb (86.2 kg)    General: Pleasant. Well developed, well nourished and in no acute distress.   HEENT: Normal.  Neck: Supple, no JVD, carotid bruits, or masses noted.  Cardiac: ***Regular rate and rhythm. No murmurs, rubs, or gallops. No edema.  Respiratory:  Lungs are clear to auscultation bilaterally with normal work of breathing.  GI: Soft and nontender.  MS: No deformity or atrophy. Gait and ROM intact.  Skin: Warm and dry. Color is normal.  Neuro:  Strength and sensation are intact and no gross focal deficits noted.  Psych: Alert, appropriate and with normal affect.   LABORATORY DATA:  EKG:  EKG  {ACTION; IS/IS [id number]} ordered today. This demonstrates ***.  Lab Results  Component Value Date   WBC 12.9 (H) 08/20/2016   HGB 11.5 (L) 08/20/2016   HCT 34.0 (L) 08/20/2016   PLT 287 08/20/2016   GLUCOSE 163 (H) 08/20/2016   CHOL 153 12/25/2014   TRIG 204.0 (H) 12/25/2014   HDL 39.40 12/25/2014   LDLDIRECT 73.0 12/25/2014   ALT 33 08/17/2016   AST 30 08/17/2016   NA 128 (L) 08/20/2016   K 3.3 (L) 08/20/2016   CL 92 (L) 08/20/2016   CREATININE 0.95 08/20/2016   BUN 8 08/20/2016   CO2 23 08/20/2016   INR 0.93 08/17/2016   HGBA1C 6.5 (H) 08/17/2016     BNP (last 3 results) No results for input(s): BNP in the last 8760 hours.  ProBNP (last 3 results) No results for input(s): PROBNP in the last 8760 hours.   Other Studies Reviewed Today:   Assessment/Plan:   Current medicines are reviewed with the patient today.  The patient does not have concerns regarding medicines other than what has been noted above.  The following changes have been made:  See above.  Labs/ tests ordered today include:   No orders of the defined types were placed in this encounter.    Disposition:   FU with *** in {gen number 2-95:621308} {Days to years:10300}.   Patient is agreeable to this plan and will call if any problems develop in the interim.   SignedNorma Fredrickson, NP  08/09/2017 7:33 AM  [facility] [address] [place], [place]  [address] Phone: [phone] Fax: [phone]

## 2017-09-19 ENCOUNTER — Ambulatory Visit: Payer: Medicare Other | Admitting: Nurse Practitioner

## 2017-09-19 NOTE — Progress Notes (Deleted)
CARDIOLOGY OFFICE NOTE  Date:  09/19/2017    [name] Date of Birth: August 18, 1955 Medical Record #[id number]  PCP:  Elias Else, MD  Cardiologist:  Tyrone Sage & ***    No chief complaint on file.   History of Present Illness: [name] is a 62 y.o. female who presents today for a *** pre op clearance visit. Seen for Dr. Shirlee Latch.   She has a history of membranous VSD (small), cardiomyopathy (probably due to HTN, EF improved on most recent echo), and prior VT s/p ablation. In 2/15, she had a Lexiscan Cardiolite showing EF 61% and a fixed small apical defect with no ischemia. Repeat echo in 2/15 showed EF 45-50% with small stable perimembranous VSD and and normal RV.   Last seen by Dr. Shirlee Latch back in March of 2016 - had had a prior fall with right femur fracture in 11/15. This was surgically repaired. She was still smoking. She was no longer on Coreg.   I last saw her in November of 2016 - having more chest pain - lots of stress - referred on for cardiac cath - see below - mild nonobstructive disease.   Comes back today. Here alone. Needing surgical clearance for shoulder surgery with Dr. Ave Filter. Has cancelled this visit several times. Surgery scheduled for February 1st. She says she is doing ok. One 2 statins - she does not know which one she is actually taking. She says she has had only one episode of chest pain since the cath and that "was a long time ago". She is still smoking - 1/2 pack. She was sick before Christmas with some type of virus that was slow to go away - sounds like she had bronchitis. Very little residual cough now. Breathing is ok. Not exercising but tries to stay active. She is limited by her orthopedic issues. Has had a tendon pull loose from the left shoulder - fortunately this is the shoulder to be replaced.   PMH: 1. GERD 2. HTN 3. Membranous VSD: Small.  4. Scoliosis 5. VT: s/p VT ablation in the early 1990s in [place].   6. Cardiomyopathy: Possibly related to HTN. This improved with control of BP. Last echo in 3/13 showed EF 50-55%, small perimembranous VSD, mild LVH, mild AI, PA systolic pressure 32 mmHg. Echo (2/15) with EF 45-50%, stable small perimembranous VSD, normal RV size and systolic function, mild aortic insufficiency.  7. Type II diabetes 8. Atypical chest pain: Lexiscan Cardiolite (2/15) with fixed small apical defect, no ischemia (low risk).  9. Bilateral THR   Comes in today. Here with   Past Medical History:  Diagnosis Date  . Anemia   . Anxiety   . Arthritis   . Depression   . GERD (gastroesophageal reflux disease)   . Heart murmur    "related to VSD"  . High cholesterol   . History of blood transfusion    "related to OR" (08/19/2016)  . History of hiatal hernia   . Hypertension   . Hyperthyroidism   . Paroxysmal ventricular tachycardia (HCC)   . Type II diabetes mellitus (HCC)   . UTI (urinary tract infection)    being treated with Keflex  . Ventricular septal defect     Past Surgical History:  Procedure Laterality Date  . BACK SURGERY    . CARDIAC CATHETERIZATION N/A 06/23/2015   Procedure: Left Heart Cath and Coronary Angiography;  Surgeon: Laurey Morale, MD;  Location: [facility] INVASIVE CV LAB;  Service: Cardiovascular;  Laterality: N/A;  . CARDIAC CATHETERIZATION  1960   "VSD was so small; didn't need repaired"  . EXAM UNDER ANESTHESIA WITH MANIPULATION OF HIP Right 06/02/2014   dr Jerl Santos  . FRACTURE SURGERY    . HERNIA REPAIR    . HIP CLOSED REDUCTION Right 06/02/2014   Procedure: CLOSED MANIPULATION HIP;  Surgeon: Velna Ochs, MD;  Location: [facility] OR;  Service: Orthopedics;  Laterality: Right;  . JOINT REPLACEMENT    . JOINT REPLACEMENT    . REFRACTIVE SURGERY Bilateral   . SHOULDER ARTHROSCOPY Right   . SHOULDER OPEN ROTATOR CUFF REPAIR Right   . SPINAL FUSION  1996   "t10 down to my coccyx  . SPINE HARDWARE REMOVAL    . TOTAL ABDOMINAL HYSTERECTOMY     . TOTAL HIP ARTHROPLASTY Right 05/10/2014   [place]      by dr Carola Rhine  . TOTAL KNEE ARTHROPLASTY Left   . TOTAL KNEE ARTHROPLASTY Left   . TOTAL SHOULDER ARTHROPLASTY Left 08/19/2016  . TOTAL SHOULDER ARTHROPLASTY Left 08/19/2016   Procedure: TOTAL SHOULDER ARTHROPLASTY;  Surgeon: Jones Broom, MD;  Location: [facility] OR;  Service: Orthopedics;  Laterality: Left;  Left total shoulder replacement     Medications: No outpatient medications have been marked as taking for the 09/19/17 encounter (Appointment) with Rosalio Macadamia, NP.     Allergies: Allergies  Allergen Reactions  . No Known Allergies     Social History: The patient  reports that she has been smoking cigarettes.  She has a 25.00 pack-year smoking history. she has never used smokeless tobacco. She reports that she does not drink alcohol or use drugs.   Family History: The patient's ***family history includes Heart attack in her other; Other in her mother; Stroke (age of onset: 70) in her father.   Review of Systems: Please see the history of present illness.   Otherwise, the review of systems is positive for {NONE DEFAULTED:18576::"none"}.   All other systems are reviewed and negative.   Physical Exam: VS:  There were no vitals taken for this visit. [name]  BMI There is no height or weight on file to calculate BMI.  Wt Readings from Last 3 Encounters:  08/19/16 192 lb 5 oz (87.2 kg)  08/17/16 192 lb 4.8 oz (87.2 kg)  08/10/16 190 lb (86.2 kg)    General: Pleasant. Well developed, well nourished and in no acute distress.   HEENT: Normal.  Neck: Supple, no JVD, carotid bruits, or masses noted.  Cardiac: ***Regular rate and rhythm. No murmurs, rubs, or gallops. No edema.  Respiratory:  Lungs are clear to auscultation bilaterally with normal work of breathing.  GI: Soft and nontender.  MS: No deformity or atrophy. Gait and ROM intact.  Skin: Warm and dry. Color is normal.  Neuro:  Strength and sensation are  intact and no gross focal deficits noted.  Psych: Alert, appropriate and with normal affect.   LABORATORY DATA:  EKG:  EKG {ACTION; IS/IS DHW:[id number]} ordered today. This demonstrates ***.  Lab Results  Component Value Date   WBC 12.9 (H) 08/20/2016   HGB 11.5 (L) 08/20/2016   HCT 34.0 (L) 08/20/2016   PLT 287 08/20/2016   GLUCOSE 163 (H) 08/20/2016   CHOL 153 12/25/2014   TRIG 204.0 (H) 12/25/2014   HDL 39.40 12/25/2014   LDLDIRECT 73.0 12/25/2014   ALT 33 08/17/2016   AST 30 08/17/2016   NA 128 (L) 08/20/2016   K 3.3 (L) 08/20/2016  CL 92 (L) 08/20/2016   CREATININE 0.95 08/20/2016   BUN 8 08/20/2016   CO2 23 08/20/2016   INR 0.93 08/17/2016   HGBA1C 6.5 (H) 08/17/2016     BNP (last 3 results) No results for input(s): BNP in the last 8760 hours.  ProBNP (last 3 results) No results for input(s): PROBNP in the last 8760 hours.   Other Studies Reviewed Today:   Assessment/Plan: Cardiac Cath Conclusion from 06/2015   Mild, nonobstructive CAD. Suspect non-cardiac chest pain    Echo Study Conclusions from 10/2014  - Left ventricle: The cavity size was normal. Systolic function was mildly reduced. The estimated ejection fraction was in the range of 45% to 50%. Wall motion was normal; there were no regional wall motion abnormalities. Doppler parameters are consistent with abnormal left ventricular relaxation (grade 1 diastolic dysfunction). - Ventricular septum: There was a small congenital ventricular septal defectin the perimembranous region. - Mitral valve: There was mild regurgitation. - Left atrium: The atrium was mildly dilated.  Impressions:  - Compared to the prior study, there has been no significant interval change.  Assessment/Plan: 1. CAD - mild and nonobstructive by cardiac cath from 2016 - needs CV risk factor modification which has been challenging for her. She has no symptoms at this time.   2. Smoking: I strongly  encouraged her to quit smoking. Still not sure she is quite ready to stop.  3. Hyperlipidemia: on statin therapy  4. Perimembranous VSD: Soft murmur noted. This is small and has been known for a long time. Most recent echo from April of 2016 noted. Needs updating.   5. Cardiomyopathy: Possibly related to HTN. EF 45-50% on last echo, this looks to be relatively stable. She remains on ACE and beta blocker therapy. Would like to get echo updated.   6. HTN: BP controlled on current regimen.   7. H/o VT: s/p ablation. No symptomatic recurrence.   8. Pre op clearance - she should be an acceptable candidate for her surgery. No current symptoms noted. Will be available as needed. Would be at increased risk for pulmonary complications. Strongly encouraged to work on smoking cessation. Our service will be available as needed. She is getting her echo updated tomorrow.     Current medicines are reviewed with the patient today.  The patient does not have concerns regarding medicines other than what has been noted above.  The following changes have been made:  See above.  Labs/ tests ordered today include:   No orders of the defined types were placed in this encounter.    Disposition:   FU with *** in {gen number 1-61:096045} {Days to years:10300}.   Patient is agreeable to this plan and will call if any problems develop in the interim.   SignedNorma Fredrickson, NP  09/19/2017 7:23 AM  [facility] [address] [place], [place]  [address] Phone: [phone] Fax: [phone]

## 2017-10-17 ENCOUNTER — Ambulatory Visit: Payer: Medicare Other | Admitting: Nurse Practitioner

## 2017-10-17 ENCOUNTER — Encounter (INDEPENDENT_AMBULATORY_CARE_PROVIDER_SITE_OTHER): Payer: Self-pay

## 2017-10-17 ENCOUNTER — Encounter: Payer: Self-pay | Admitting: Nurse Practitioner

## 2017-10-17 VITALS — BP 112/80 | HR 95 | Ht 64.0 in | Wt 186.1 lb

## 2017-10-17 DIAGNOSIS — I259 Chronic ischemic heart disease, unspecified: Secondary | ICD-10-CM

## 2017-10-17 DIAGNOSIS — E871 Hypo-osmolality and hyponatremia: Secondary | ICD-10-CM | POA: Diagnosis not present

## 2017-10-17 DIAGNOSIS — Q210 Ventricular septal defect: Secondary | ICD-10-CM | POA: Diagnosis not present

## 2017-10-17 MED ORDER — LOSARTAN POTASSIUM 100 MG PO TABS: 100.0000 mg | ORAL_TABLET | Freq: Every day | ORAL | 3 refills | Status: DC

## 2017-10-17 NOTE — Patient Instructions (Addendum)
We will be checking the following labs today - BMET, HPF and Lipids  BMET in one week   Medication Instructions:    Continue with your current medicines. BUT  I am stopping the Hyzaar  Starting Losartan 100 mg (plain) daily - this is at the drug store.    Testing/Procedures To Be Arranged:  Echocardiogram  Follow-Up:   See me in about 4 to 6 weeks     Other Special Instructions:   N/A    If you need a refill on your cardiac medications before your next appointment, please call your pharmacy.   Call the [facility] office at [phone] if you have any questions, problems or concerns.

## 2017-10-17 NOTE — Progress Notes (Signed)
CARDIOLOGY OFFICE NOTE  Date:  10/17/2017    [name] Date of Birth: 08/28/1955 Medical Record #[id number]  PCP:  Elias Else, MD  Cardiologist:  [name]    Chief Complaint  Patient presents with  . Coronary Artery Disease    Follow up visit    History of Present Illness: [name] is a 62 y.o. female who presents today for a follow up visit. Seen for Dr. Shirlee Latch.   She has a history of membranous VSD (small), cardiomyopathy (probably due to HTN, EF improved on most recent echo), and prior VT s/p ablation. In 2/15, she had a Lexiscan Cardiolite showing EF 61% and a fixed small apical defect with no ischemia. Repeat echo in 2/15 showed EF 45-50% with small stable perimembranous VSD and and normal RV.   Last seen by Dr. Shirlee Latch back in March of 2016 - had had a prior fall with right femur fracture in 11/15. This was surgically repaired. She was still smoking. She was no longer on Coreg.   I saw her in November of 2016 - having more chest pain - lots of stress - referred on for cardiac cath - see below - mild nonobstructive disease. Last visit with me was in January of 2018. Needed surgical clearance for shoulder surgery at that time. Was still smoking.   Comes back today. Here alone. She is doing ok. Her medicine list does not match up with ours here. She continues to smoke. She has had a chest cold. She broke her patella back in December - while at the salon getting haircut. She notes she has had more issues with her sodium level - has actually been in the hospital for this. She has had several UTI's. She is not restricting her fluid intake. She is on Hyzaar. She is also on Luvox which can cause this - she tells me that she has cut this dose back to just 2 pills daily. Says she just got her 10 year chip for not drinking. Headed to [place] next week. No chest pain. Breathing is fair. She is not active due to her orthopedic issues.    PMH: 1.  GERD 2. HTN 3. Membranous VSD: Small.  4. Scoliosis 5. VT: s/p VT ablation in the early 1990s in [place].  6. Cardiomyopathy: Possibly related to HTN. This improved with control of BP. Last echo in 3/13 showed EF 50-55%, small perimembranous VSD, mild LVH, mild AI, PA systolic pressure 32 mmHg. Echo (2/15) with EF 45-50%, stable small perimembranous VSD, normal RV size and systolic function, mild aortic insufficiency.  7. Type II diabetes 8. Atypical chest pain: Lexiscan Cardiolite (2/15) with fixed small apical defect, no ischemia (low risk).  9. Bilateral THR   Past Medical History:  Diagnosis Date  . Anemia   . Anxiety   . Arthritis   . Depression   . GERD (gastroesophageal reflux disease)   . Heart murmur    "related to VSD"  . High cholesterol   . History of blood transfusion    "related to OR" (08/19/2016)  . History of hiatal hernia   . Hypertension   . Hyperthyroidism   . Paroxysmal ventricular tachycardia (HCC)   . Type II diabetes mellitus (HCC)   . UTI (urinary tract infection)    being treated with Keflex  . Ventricular septal defect     Past Surgical History:  Procedure Laterality Date  . BACK SURGERY    . CARDIAC CATHETERIZATION  N/A 06/23/2015   Procedure: Left Heart Cath and Coronary Angiography;  Surgeon: Laurey Morale, MD;  Location: [facility] INVASIVE CV LAB;  Service: Cardiovascular;  Laterality: N/A;  . CARDIAC CATHETERIZATION  1960   "VSD was so small; didn't need repaired"  . EXAM UNDER ANESTHESIA WITH MANIPULATION OF HIP Right 06/02/2014   dr Jerl Santos  . FRACTURE SURGERY    . HERNIA REPAIR    . HIP CLOSED REDUCTION Right 06/02/2014   Procedure: CLOSED MANIPULATION HIP;  Surgeon: Velna Ochs, MD;  Location: [facility] OR;  Service: Orthopedics;  Laterality: Right;  . JOINT REPLACEMENT    . JOINT REPLACEMENT    . REFRACTIVE SURGERY Bilateral   . SHOULDER ARTHROSCOPY Right   . SHOULDER OPEN ROTATOR CUFF REPAIR Right   . SPINAL FUSION  1996   "t10  down to my coccyx  . SPINE HARDWARE REMOVAL    . TOTAL ABDOMINAL HYSTERECTOMY    . TOTAL HIP ARTHROPLASTY Right 05/10/2014   [place]      by dr Carola Rhine  . TOTAL KNEE ARTHROPLASTY Left   . TOTAL KNEE ARTHROPLASTY Left   . TOTAL SHOULDER ARTHROPLASTY Left 08/19/2016  . TOTAL SHOULDER ARTHROPLASTY Left 08/19/2016   Procedure: TOTAL SHOULDER ARTHROPLASTY;  Surgeon: Jones Broom, MD;  Location: [facility] OR;  Service: Orthopedics;  Laterality: Left;  Left total shoulder replacement     Medications: Current Meds  Medication Sig  . amLODipine (NORVASC) 5 MG tablet Take 5 mg by mouth daily.  [name] amoxicillin (AMOXIL) 500 MG capsule Take 2,000 mg by mouth once. Take 4 capsules one hour prior to dental appointment  . aspirin 81 MG tablet Take 81 mg by mouth daily.    [name] buPROPion (WELLBUTRIN XL) 300 MG 24 hr tablet Take 300 mg by mouth daily.    . carvedilol (COREG) 3.125 MG tablet Take 3.125 mg by mouth 2 (two) times daily.  . fluvoxaMINE (LUVOX) 100 MG tablet Take 100 mg by mouth at bedtime. 3 tablets at bedtime (300 mg )  . lamoTRIgine (LAMICTAL) 25 MG tablet Take 25 mg by mouth 2 (two) times daily.  [name] levothyroxine (SYNTHROID, LEVOTHROID) 50 MCG tablet Take 50 mcg by mouth daily before breakfast.  . LORazepam (ATIVAN) 1 MG tablet Take 1-2 mg by mouth See admin instructions. One hour before dental appointment  . LYRICA 100 MG capsule Take 100 mg by mouth 2 (two) times daily.  . metFORMIN (GLUCOPHAGE) 1000 MG tablet Take 1,000 mg by mouth 2 (two) times daily.   . Multiple Vitamin (MULTIVITAMIN) tablet Take 1 tablet by mouth daily.    [name] omeprazole (PRILOSEC OTC) 20 MG tablet Take 20 mg by mouth every evening.   [name] oxyCODONE-acetaminophen (PERCOCET) 7.5-325 MG tablet Take 1-2 tablets by mouth every 4 (four) hours as needed for moderate pain.  . promethazine (PHENERGAN) 25 MG tablet Take 25 mg by mouth every 6 (six) hours as needed for nausea or vomiting.  [name] QUEtiapine (SEROQUEL) 300 MG  tablet Take 300 mg by mouth at bedtime.   . rosuvastatin (CRESTOR) 20 MG tablet Take 20 mg by mouth at bedtime.  . [DISCONTINUED] losartan-hydrochlorothiazide (HYZAAR) 100-12.5 MG per tablet Take 1 tablet by mouth daily.     Allergies: Allergies  Allergen Reactions  . No Known Allergies     Social History: The patient  reports that she has been smoking cigarettes.  She has a 25.00 pack-year smoking history. She has never used smokeless tobacco. She reports that she does not  drink alcohol or use drugs.   Family History: The patient's family history includes Heart attack in her other; Other in her mother; Stroke (age of onset: 57) in her father.   Review of Systems: Please see the history of present illness.   Otherwise, the review of systems is positive for none.   All other systems are reviewed and negative.   Physical Exam: VS:  BP 112/80 (BP Location: Left Arm, Patient Position: Sitting, Cuff Size: Normal)   Pulse 95   Ht 5\' 4"  (1.626 m)   Wt 186 lb 1.9 oz (84.4 kg)   BMI 31.95 kg/m  .  BMI Body mass index is 31.95 kg/m.  Wt Readings from Last 3 Encounters:  10/17/17 186 lb 1.9 oz (84.4 kg)  08/19/16 192 lb 5 oz (87.2 kg)  08/17/16 192 lb 4.8 oz (87.2 kg)    General: Pleasant. Alert and in no acute distress. She has lost about 6 pounds.   HEENT: Normal.  Neck: Supple, no JVD, carotid bruits, or masses noted.  Cardiac: Regular rate and rhythm. Soft outflow murmur noted. No edema.  Respiratory:  Lungs are clear to auscultation bilaterally with normal work of breathing.  GI: Soft and nontender.  MS: No deformity or atrophy. Gait and ROM intact.  Skin: Warm and dry. Color is normal.  Neuro:  Strength and sensation are intact and no gross focal deficits noted.  Psych: Alert, appropriate and with normal affect.   LABORATORY DATA:  EKG:  EKG is ordered today. This demonstrates NSR- unchanged.  Lab Results  Component Value Date   WBC 12.9 (H) 08/20/2016   HGB 11.5 (L)  08/20/2016   HCT 34.0 (L) 08/20/2016   PLT 287 08/20/2016   GLUCOSE 163 (H) 08/20/2016   CHOL 153 12/25/2014   TRIG 204.0 (H) 12/25/2014   HDL 39.40 12/25/2014   LDLDIRECT 73.0 12/25/2014   ALT 33 08/17/2016   AST 30 08/17/2016   NA 128 (L) 08/20/2016   K 3.3 (L) 08/20/2016   CL 92 (L) 08/20/2016   CREATININE 0.95 08/20/2016   BUN 8 08/20/2016   CO2 23 08/20/2016   INR 0.93 08/17/2016   HGBA1C 6.5 (H) 08/17/2016     BNP (last 3 results) No results for input(s): BNP in the last 8760 hours.  ProBNP (last 3 results) No results for input(s): PROBNP in the last 8760 hours.   Other Studies Reviewed Today:  Echo Study Conclusions 07/2016  - Left ventricle: The cavity size was normal. Wall thickness was   normal. Systolic function was mildly reduced. The estimated   ejection fraction was in the range of 45% to 50%. Diffuse   hypokinesis. Doppler parameters are consistent with abnormal left   ventricular relaxation (grade 1 diastolic dysfunction). - Ventricular septum: Small peri-membranous VSD noted with left to   right flow. - Aortic valve: Trileaflet; calcified right coronary cusp. There   was no stenosis. There was trivial regurgitation. - Aorta: Borderline dilated aortic root and ascending aorta. Aortic   root dimension: 38 mm (ED). Ascending aortic diameter: 39 mm (S). - Mitral valve: There was trivial regurgitation. - Right ventricle: The cavity size was normal. Systolic function   was normal. - Tricuspid valve: Peak RV-RA gradient (S): 23 mm Hg. - Pulmonary arteries: PA peak pressure: 26 mm Hg (S). - Inferior vena cava: The vessel was normal in size. The   respirophasic diameter changes were in the normal range (>= 50%),   consistent with normal central venous pressure.  Impressions:  - Normal LV size with EF 45-50%, diffuse hypokinesis. Normal RV   size and systolic function. There was a small peri-membranous VSD   with left to right flow, this appears  restrictive. Trivial aortic   insufficiency.     Cardiac Cath Conclusion from 06/2015   Mild, nonobstructive CAD. Suspect non-cardiac chest pain     Assessment/Plan: 1. Hyponatremia - not doing fluid restriction and admits she is drinking lots of water/Gatorade. Recheck lab. Stop Hyzaar and change to Losartan 100 mg a day. Lab in one week.   2. CAD - mild and nonobstructive by cardiac cath from 2016 - needs CV risk factor modification which has been challenging for her. She has no symptoms at this time.   3. Smoking: she is not ready to stop  4. Hyperlipidemia: on statin therapy - she has not eaten today - will check lab today  5. Perimembranous VSD: Soft murmur noted. This is small and has been known for a long time. Getting echo updated   6. Cardiomyopathy: updating her echo  7. HTN:  BP actually low - stopping HCTZ today - hopefully this will help her sodium level.   8. H/o VT: s/p ablation. No symptomatic recurrence.   9. Bipolar disorder - followed by psyche  Current medicines are reviewed with the patient today.  The patient does not have concerns regarding medicines other than what has been noted above.  The following changes have been made:  See above.  Labs/ tests ordered today include:    Orders Placed This Encounter  Procedures  . Basic metabolic panel  . Hepatic function panel  . Lipid panel  . Basic metabolic panel  . EKG 12-Lead  . ECHOCARDIOGRAM COMPLETE     Disposition:   FU with me in about 4 to 6 weeks.  Patient is agreeable to this plan and will call if any problems develop in the interim.   SignedNorma Fredrickson, NP  10/17/2017 3:28 PM  [facility] [address] [place], [place]  [address] Phone: [phone] Fax: [phone]

## 2017-10-18 ENCOUNTER — Other Ambulatory Visit: Payer: Self-pay | Admitting: *Deleted

## 2017-10-18 DIAGNOSIS — R748 Abnormal levels of other serum enzymes: Secondary | ICD-10-CM

## 2017-10-18 LAB — HEPATIC FUNCTION PANEL
ALT: 72 IU/L — ABNORMAL HIGH (ref 0–32)
AST: 72 IU/L — ABNORMAL HIGH (ref 0–40)
Albumin: 4.6 g/dL (ref 3.6–4.8)
Alkaline Phosphatase: 113 IU/L (ref 39–117)
Bilirubin Total: 0.3 mg/dL (ref 0.0–1.2)
Bilirubin, Direct: 0.12 mg/dL (ref 0.00–0.40)
Total Protein: 7.1 g/dL (ref 6.0–8.5)

## 2017-10-18 LAB — BASIC METABOLIC PANEL
BUN/Creatinine Ratio: 13 (ref 12–28)
BUN: 8 mg/dL (ref 8–27)
CO2: 19 mmol/L — ABNORMAL LOW (ref 20–29)
Calcium: 9.4 mg/dL (ref 8.7–10.3)
Chloride: 96 mmol/L (ref 96–106)
Creatinine, Ser: 0.63 mg/dL (ref 0.57–1.00)
GFR calc Af Amer: 111 mL/min/{1.73_m2} (ref >59)
GFR calc non Af Amer: 96 mL/min/{1.73_m2} (ref >59)
Glucose: 133 mg/dL — ABNORMAL HIGH (ref 65–99)
Potassium: 4.5 mmol/L (ref 3.5–5.2)
Sodium: 136 mmol/L (ref 134–144)

## 2017-10-18 LAB — LIPID PANEL
Chol/HDL Ratio: 2.1 ratio (ref 0.0–4.4)
Cholesterol, Total: 97 mg/dL — ABNORMAL LOW (ref 100–199)
HDL: 47 mg/dL (ref >39)
LDL Calculated: 27 mg/dL (ref 0–99)
Triglycerides: 115 mg/dL (ref 0–149)
VLDL Cholesterol Cal: 23 mg/dL (ref 5–40)

## 2017-10-24 ENCOUNTER — Other Ambulatory Visit: Payer: Medicare Other

## 2017-11-03 ENCOUNTER — Telehealth: Payer: Self-pay | Admitting: Nurse Practitioner

## 2017-11-03 NOTE — Telephone Encounter (Signed)
New Message:     Pt states she would like to come in and do labs tomorrow but the order has expired. If the order can be put back in before tomorrow the pt will come. Please advise pt on whether she can come tomorrow

## 2017-11-04 ENCOUNTER — Other Ambulatory Visit: Payer: Self-pay | Admitting: *Deleted

## 2017-11-04 ENCOUNTER — Other Ambulatory Visit: Payer: Medicare Other | Admitting: *Deleted

## 2017-11-04 DIAGNOSIS — I10 Essential (primary) hypertension: Secondary | ICD-10-CM

## 2017-11-04 DIAGNOSIS — I509 Heart failure, unspecified: Secondary | ICD-10-CM

## 2017-11-04 IMAGING — CR DG CHEST 2V
2 series · 2 of 2 positions shown · non-contrast
Comparison: PA and lateral chest x-ray March 10, 2012.

CLINICAL DATA: Preoperative exam prior to heart catheterization ;
history of hypertension and diabetes and smoking.

EXAM:
CHEST  2 VIEW

[w chest pa]
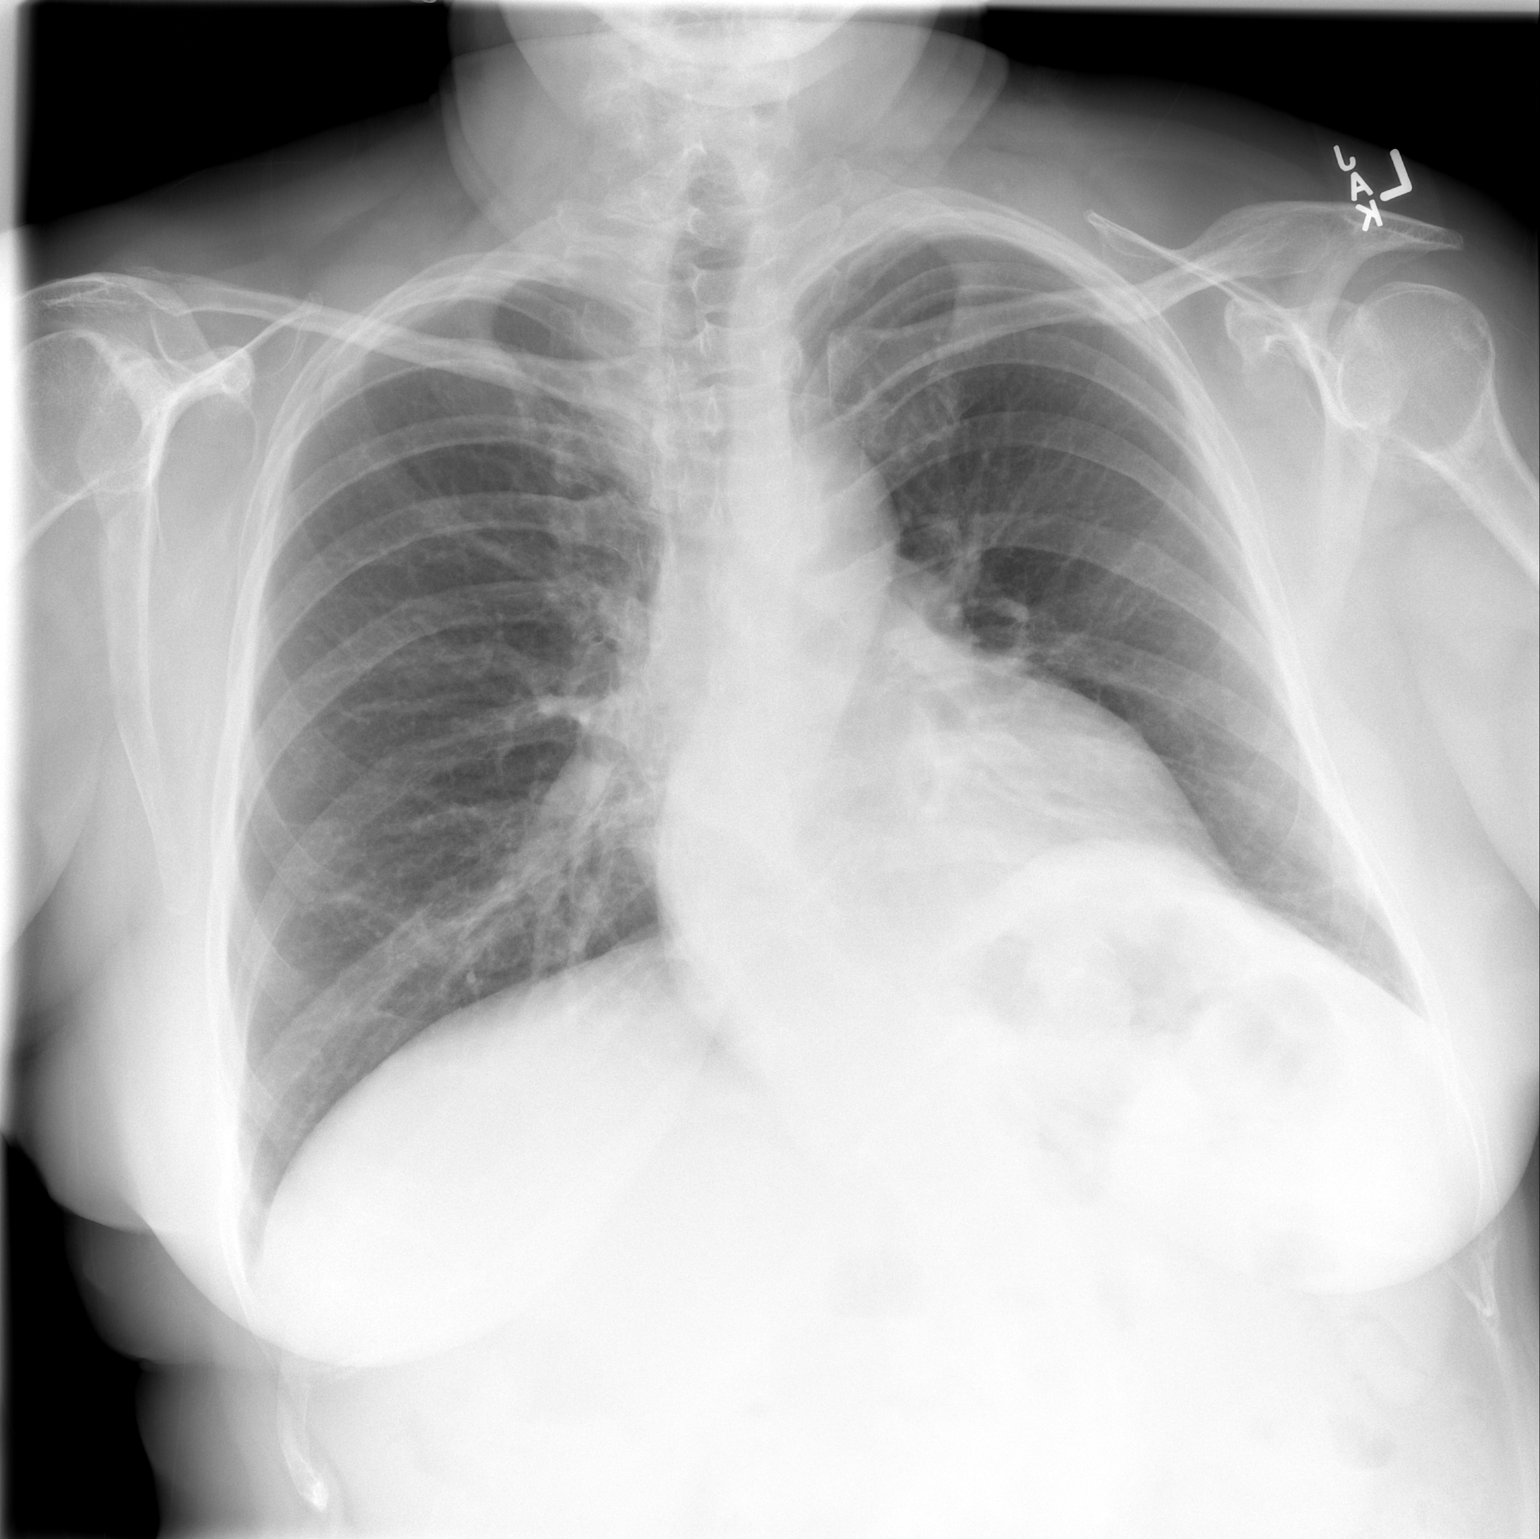

[w chest lat]
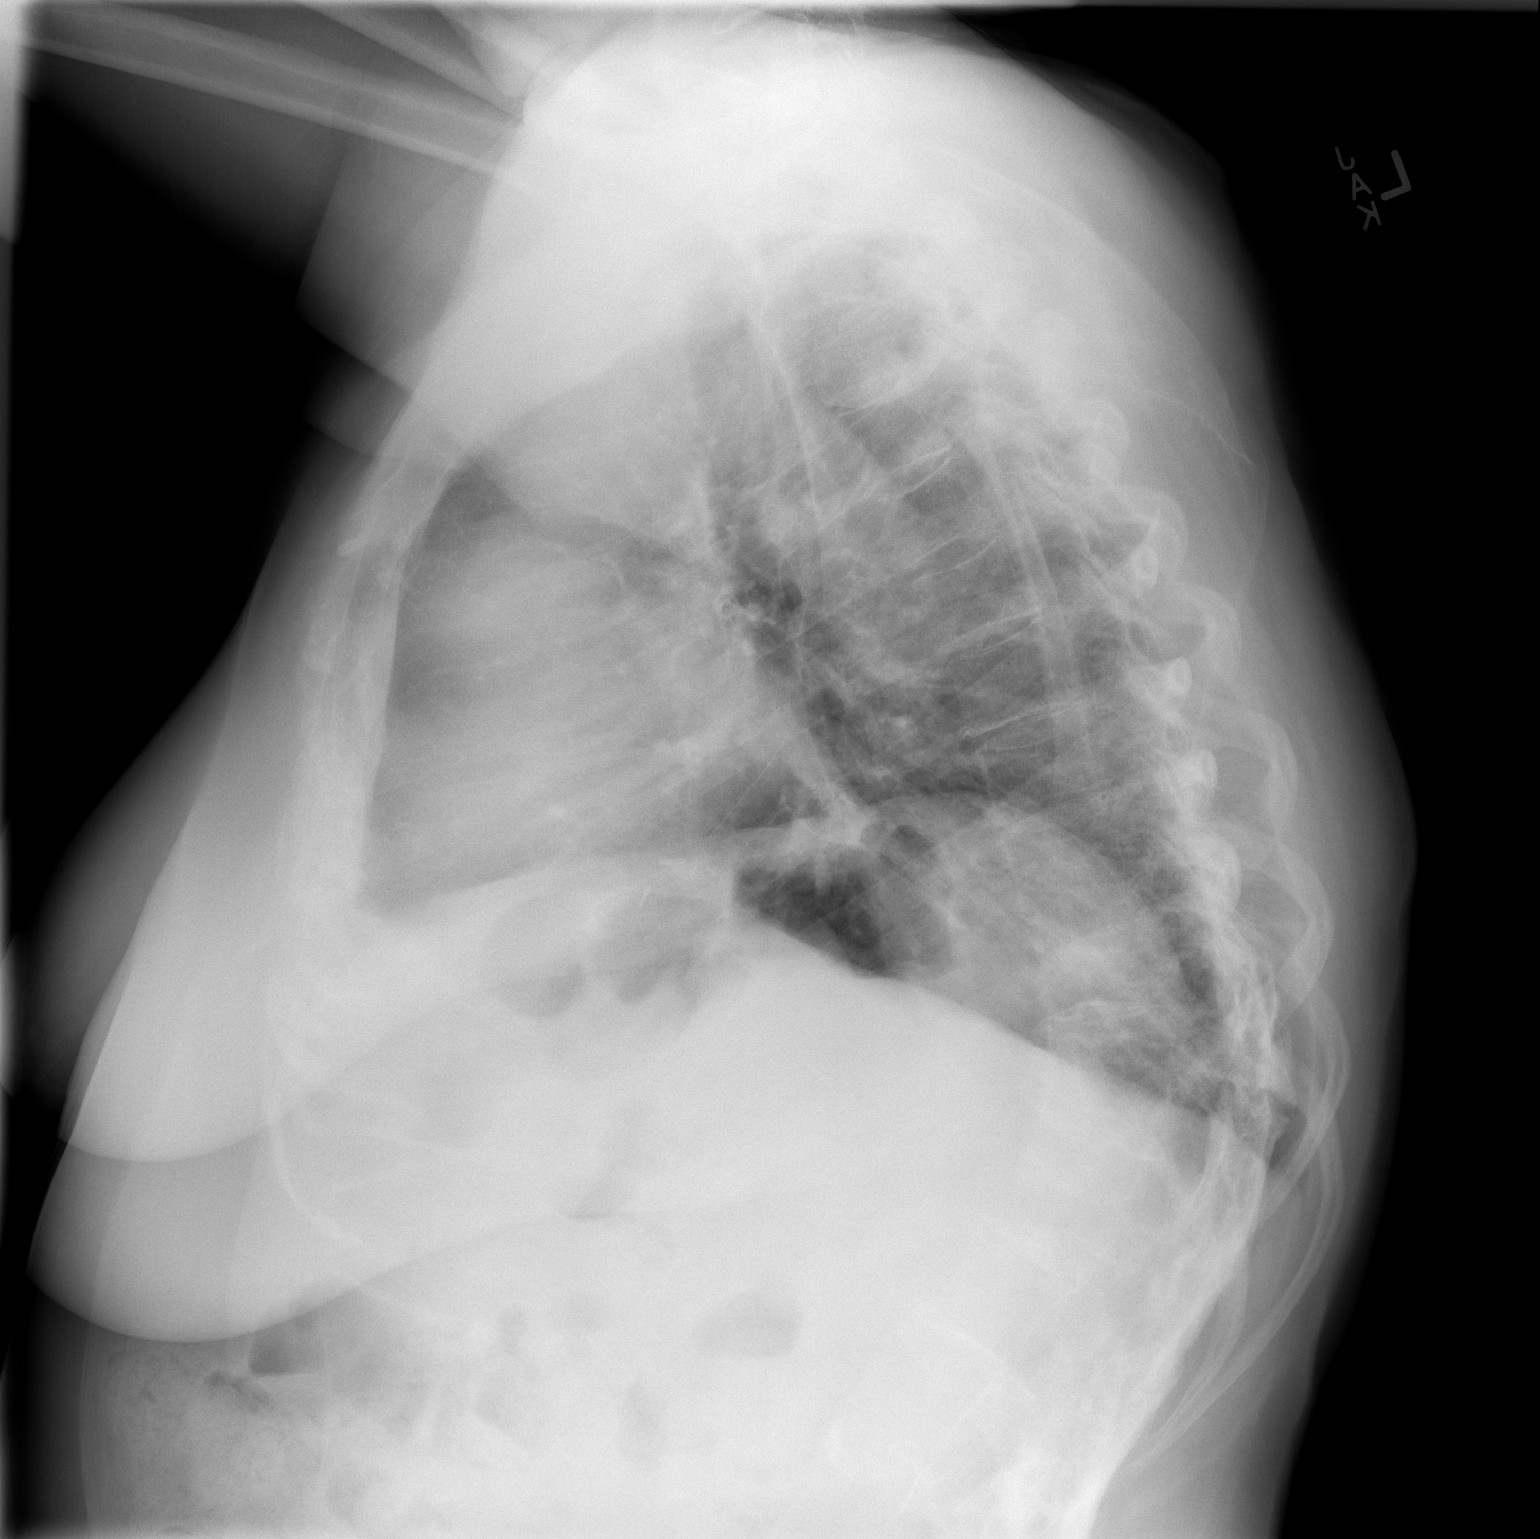

[2 of 2 positions shown; findings below may reference images not displayed]

FINDINGS: The lungs are adequately inflated. There is no focal infiltrate.
There is no pleural effusion. The heart and pulmonary vascularity
are normal. The mediastinum is normal in width. There is tortuosity
of the descending thoracic aorta. There is prominent dextroscoliosis
of the mid thoracic spine.
IMPRESSION: There is no active cardiopulmonary disease.

## 2017-11-04 NOTE — Telephone Encounter (Signed)
lvm for pt to come in today to get labs.  appt made, orders in and linked.

## 2017-11-05 LAB — BASIC METABOLIC PANEL
BUN/Creatinine Ratio: 11 — ABNORMAL LOW (ref 12–28)
BUN: 8 mg/dL (ref 8–27)
CO2: 23 mmol/L (ref 20–29)
Calcium: 9.7 mg/dL (ref 8.7–10.3)
Chloride: 102 mmol/L (ref 96–106)
Creatinine, Ser: 0.73 mg/dL (ref 0.57–1.00)
GFR calc Af Amer: 102 mL/min/{1.73_m2} (ref >59)
GFR calc non Af Amer: 89 mL/min/{1.73_m2} (ref >59)
Glucose: 176 mg/dL — ABNORMAL HIGH (ref 65–99)
Potassium: 4.2 mmol/L (ref 3.5–5.2)
Sodium: 142 mmol/L (ref 134–144)

## 2017-11-05 LAB — HEPATIC FUNCTION PANEL
ALT: 63 IU/L — ABNORMAL HIGH (ref 0–32)
AST: 55 IU/L — ABNORMAL HIGH (ref 0–40)
Albumin: 4.5 g/dL (ref 3.6–4.8)
Alkaline Phosphatase: 121 IU/L — ABNORMAL HIGH (ref 39–117)
Bilirubin Total: <0.2 mg/dL (ref 0.0–1.2)
Bilirubin, Direct: 0.1 mg/dL (ref 0.00–0.40)
Total Protein: 6.8 g/dL (ref 6.0–8.5)

## 2017-11-14 ENCOUNTER — Other Ambulatory Visit (HOSPITAL_COMMUNITY): Payer: Medicare Other

## 2017-11-21 ENCOUNTER — Ambulatory Visit: Payer: Medicare Other | Admitting: Nurse Practitioner

## 2017-11-28 ENCOUNTER — Ambulatory Visit (HOSPITAL_COMMUNITY): Payer: Medicare Other | Attending: Nurse Practitioner

## 2017-11-28 ENCOUNTER — Other Ambulatory Visit: Payer: Self-pay

## 2017-11-28 DIAGNOSIS — I429 Cardiomyopathy, unspecified: Secondary | ICD-10-CM | POA: Insufficient documentation

## 2017-11-28 DIAGNOSIS — E119 Type 2 diabetes mellitus without complications: Secondary | ICD-10-CM | POA: Diagnosis not present

## 2017-11-28 DIAGNOSIS — I351 Nonrheumatic aortic (valve) insufficiency: Secondary | ICD-10-CM | POA: Diagnosis not present

## 2017-11-28 DIAGNOSIS — I119 Hypertensive heart disease without heart failure: Secondary | ICD-10-CM | POA: Diagnosis not present

## 2017-11-28 DIAGNOSIS — Q210 Ventricular septal defect: Secondary | ICD-10-CM

## 2017-11-30 ENCOUNTER — Telehealth: Payer: Self-pay | Admitting: Nurse Practitioner

## 2017-11-30 NOTE — Telephone Encounter (Signed)
Follow Up:     Returning your call, concerning her results. 

## 2017-12-02 ENCOUNTER — Telehealth: Payer: Self-pay | Admitting: *Deleted

## 2017-12-02 NOTE — Telephone Encounter (Signed)
lvm for pt appt time with Lawson Fiscal on June 10 has been moved to 11:00 am due to [place] to be out of office same day by 2.

## 2017-12-26 ENCOUNTER — Ambulatory Visit: Payer: Medicare Other | Admitting: Nurse Practitioner

## 2017-12-26 NOTE — Progress Notes (Deleted)
CARDIOLOGY OFFICE NOTE  Date:  12/26/2017    [name] Date of Birth: 09/27/1955 Medical Record #[id number]  PCP:  Elias Else, MD  Cardiologist:  Kirt Boys    No chief complaint on file.   History of Present Illness: [name] is a 62 y.o. female who presents today for a follow up visit. Former patient of Dr. Alford Highland. She has basically followed with me but Dr. Excell Seltzer has agreed to co-manage with me.   She has a history of membranous VSD (small), cardiomyopathy (probably due to HTN, EF improved on most recent echo), and prior VT s/p ablation. In 2/15, she had a Lexiscan Cardiolite showing EF 61% and a fixed small apical defect with no ischemia. Repeat echo in 2/15 showed EF 45-50% with small stable perimembranous VSD and and normal RV.   Last seenby Dr. Shirlee Latch backin Marchof 2016- had had a prior fall with right femur fracture in 11/15. This was surgically repaired. She was still smoking. She was no longer on Coreg.   I saw her in November of 2016 - having more chest pain - lots of stress - referred on for cardiac cath - see below - mild nonobstructive disease.  Last seen back in April by me - her medicines did not match up well. She continued to smoke. Had had a chest cold. Broke her patella back in December. Having issues with hyponatremia. We changed her medicines. She had cut her Luvox back on her own - it can cause this. Got her cardiac studies updated. Had just got her 10 year chip for not drinking. Reviewed her echo with Dr. Excell Seltzer.   Comes back today. Here alone.  PMH: 1. GERD 2. HTN 3. Membranous VSD: Small.  4. Scoliosis 5. VT: s/p VT ablation in the early 1990s in [place].  6. Cardiomyopathy: Possibly related to HTN. This improved with control of BP. Last echo in 3/13 showed EF 50-55%, small perimembranous VSD, mild LVH, mild AI, PA systolic pressure 32 mmHg. Echo (2/15) with EF 45-50%, stable small  perimembranous VSD, normal RV size and systolic function, mild aortic insufficiency.  7. Type II diabetes 8. Atypical chest pain: Lexiscan Cardiolite (2/15) with fixed small apical defect, no ischemia (low risk).  9. Bilateral THR      Past Medical History:  Diagnosis Date  . Anemia   . Anxiety   . Arthritis   . Depression   . GERD (gastroesophageal reflux disease)   . Heart murmur    "related to VSD"  . High cholesterol   . History of blood transfusion    "related to OR" (08/19/2016)  . History of hiatal hernia   . Hypertension   . Hyperthyroidism   . Paroxysmal ventricular tachycardia (HCC)   . Type II diabetes mellitus (HCC)   . UTI (urinary tract infection)    being treated with Keflex  . Ventricular septal defect     Past Surgical History:  Procedure Laterality Date  . BACK SURGERY    . CARDIAC CATHETERIZATION N/A 06/23/2015   Procedure: Left Heart Cath and Coronary Angiography;  Surgeon: Laurey Morale, MD;  Location: [name] INVASIVE CV LAB;  Service: Cardiovascular;  Laterality: N/A;  . CARDIAC CATHETERIZATION  1960   "VSD was so small; didn't need repaired"  . EXAM UNDER ANESTHESIA WITH MANIPULATION OF HIP Right 06/02/2014   dr Jerl Santos  . FRACTURE SURGERY    . HERNIA REPAIR    . HIP CLOSED REDUCTION Right  06/02/2014   Procedure: CLOSED MANIPULATION HIP;  Surgeon: Velna Ochs, MD;  Location: [facility] OR;  Service: Orthopedics;  Laterality: Right;  . JOINT REPLACEMENT    . JOINT REPLACEMENT    . REFRACTIVE SURGERY Bilateral   . SHOULDER ARTHROSCOPY Right   . SHOULDER OPEN ROTATOR CUFF REPAIR Right   . SPINAL FUSION  1996   "t10 down to my coccyx  . SPINE HARDWARE REMOVAL    . TOTAL ABDOMINAL HYSTERECTOMY    . TOTAL HIP ARTHROPLASTY Right 05/10/2014   [place]      by dr Carola Rhine  . TOTAL KNEE ARTHROPLASTY Left   . TOTAL KNEE ARTHROPLASTY Left   . TOTAL SHOULDER ARTHROPLASTY Left 08/19/2016  . TOTAL SHOULDER ARTHROPLASTY Left 08/19/2016    Procedure: TOTAL SHOULDER ARTHROPLASTY;  Surgeon: Jones Broom, MD;  Location: [facility] OR;  Service: Orthopedics;  Laterality: Left;  Left total shoulder replacement     Medications: No outpatient medications have been marked as taking for the 12/26/17 encounter (Appointment) with Rosalio Macadamia, NP.     Allergies: Allergies  Allergen Reactions  . No Known Allergies     Social History: The patient  reports that she has been smoking cigarettes.  She has a 25.00 pack-year smoking history. She has never used smokeless tobacco. She reports that she does not drink alcohol or use drugs.   Family History: The patient's family history includes Heart attack in her other; Other in her mother; Stroke (age of onset: 78) in her father.   Review of Systems: Please see the history of present illness.   Otherwise, the review of systems is positive for none.   All other systems are reviewed and negative.   Physical Exam: VS:  There were no vitals taken for this visit. [name]  BMI There is no height or weight on file to calculate BMI.  Wt Readings from Last 3 Encounters:  10/17/17 186 lb 1.9 oz (84.4 kg)  08/19/16 192 lb 5 oz (87.2 kg)  08/17/16 192 lb 4.8 oz (87.2 kg)    General: Pleasant. Well developed, well nourished and in no acute distress.   HEENT: Normal.  Neck: Supple, no JVD, carotid bruits, or masses noted.  Cardiac: Regular rate and rhythm. No murmurs, rubs, or gallops. No edema.  Respiratory:  Lungs are clear to auscultation bilaterally with normal work of breathing.  GI: Soft and nontender.  MS: No deformity or atrophy. Gait and ROM intact.  Skin: Warm and dry. Color is normal.  Neuro:  Strength and sensation are intact and no gross focal deficits noted.  Psych: Alert, appropriate and with normal affect.   LABORATORY DATA:  EKG:  EKG is not ordered today.  Lab Results  Component Value Date   WBC 12.9 (H) 08/20/2016   HGB 11.5 (L) 08/20/2016   HCT 34.0 (L) 08/20/2016   PLT  287 08/20/2016   GLUCOSE 176 (H) 11/04/2017   CHOL 97 (L) 10/17/2017   TRIG 115 10/17/2017   HDL 47 10/17/2017   LDLDIRECT 73.0 12/25/2014   LDLCALC 27 10/17/2017   ALT 63 (H) 11/04/2017   AST 55 (H) 11/04/2017   NA 142 11/04/2017   K 4.2 11/04/2017   CL 102 11/04/2017   CREATININE 0.73 11/04/2017   BUN 8 11/04/2017   CO2 23 11/04/2017   INR 0.93 08/17/2016   HGBA1C 6.5 (H) 08/17/2016       BNP (last 3 results) No results for input(s): BNP in the last 8760 hours.  ProBNP (last 3 results) No results for input(s): PROBNP in the last 8760 hours.   Other Studies Reviewed Today:  Echo Study Conclusions 11/2017 - Left ventricle: The cavity size was normal. There was mild   concentric hypertrophy. Systolic function was normal. The   estimated ejection fraction was in the range of 50% to 55%. Wall   motion was normal; there were no regional wall motion   abnormalities. There was an increased relative contribution of   atrial contraction to ventricular filling. Doppler parameters are   consistent with abnormal left ventricular relaxation (grade 1   diastolic dysfunction). - Ventricular septum: There was a defect in the perimembranous   region consistent with a small VSD.[name] There was a small left to   right shunt through a ventricular septal defect. Peak gradient   across VSD (S): 5 mm Hg. - Aortic valve: Severe focal calcification involving the right   coronary cusp. Right coronary cusp immobility was noted. There   was mild to moderate regurgitation. Regurgitation pressure   half-time: 446 ms. - Aorta: Ascending aorta diameter: 38 mm (ED). - Ascending aorta: The ascending aorta was mildly dilated. - Mitral valve: Calcified annulus. There was trivial regurgitation. - Tricuspid valve: There was trivial regurgitation.  Notes recorded by Rosalio Macadamia, NP on 11/28/2017 at 4:34 PM EDT Ok to report. The echo has been reviewed with Dr. Excell Seltzer (has agreed to co-manage as  needed) Pumping function ok. Some mild stiffness of her heart.  Still with small VSD - the right heart is ok.  Does have mild to moderate leakage of the aortic valve - this will need to be followed  Mild dilatation of the aorta - this will need to be followed at well For now, no change with current regimen.  Needs really good BP control.    Cardiac CathConclusionfrom 06/2015  Mild, nonobstructive CAD. Suspect non-cardiac chest pain     Assessment/Plan: 1.Hyponatremia - not doing fluid restriction and admits she is drinking lots of water/Gatorade. Recheck lab. Stop Hyzaar and change to Losartan 100 mg a day. Lab in one week.   2. CAD - mild and nonobstructive by cardiac cath from 2016 - needs CV risk factor modificationwhich has been challenging for her.She has no symptoms at this time.  3. Smoking: she is not ready to stop  4. Hyperlipidemia: on statin therapy - she has not eaten today - will check lab today  5. Perimembranous VSD: Soft murmur noted. This is small and has been known for a long time. Getting echo updated  6. Cardiomyopathy: updating her echo  7. HTN:  BP actually low - stopping HCTZ today - hopefully this will help her sodium level.   8. H/o VT: s/p ablation. No symptomatic recurrence.   9. Bipolar disorder - followed by psyche   Current medicines are reviewed with the patient today.  The patient does not have concerns regarding medicines other than what has been noted above.  The following changes have been made:  See above.  Labs/ tests ordered today include:   No orders of the defined types were placed in this encounter.    Disposition:   FU with *** in {gen number 3-22:025427} {Days to years:10300}.   Patient is agreeable to this plan and will call if any problems develop in the interim.   SignedNorma Fredrickson, NP  12/26/2017 10:48 AM  [facility] [address] [place], [place]  [address] Phone: [phone])  [phone] Fax: [phone]

## 2018-02-14 ENCOUNTER — Ambulatory Visit: Payer: Medicare Other | Admitting: Nurse Practitioner

## 2018-02-14 NOTE — Progress Notes (Deleted)
CARDIOLOGY OFFICE NOTE  Date:  02/14/2018    [name] Date of Birth: Dec 30, 1955 Medical Record #[id number]  PCP:  Elias Else, MD  Cardiologist:  Tyrone Sage & ***    No chief complaint on file.   History of Present Illness: [name] is a 62 y.o. female who presents today for a ***   Comes in today. Here with  Seen for Dr. Shirlee Latch.   She has a history of membranous VSD (small), cardiomyopathy (probably due to HTN, EF improved on most recent echo), and prior VT s/p ablation. In 2/15, she had a Lexiscan Cardiolite showing EF 61% and a fixed small apical defect with no ischemia. Repeat echo in 2/15 showed EF 45-50% with small stable perimembranous VSD and and normal RV.   Last seenby Dr. Shirlee Latch backin Marchof 2016- had had a prior fall with right femur fracture in 11/15. This was surgically repaired. She was still smoking. She was no longer on Coreg.   I saw her in November of 2016 - having more chest pain - lots of stress - referred on for cardiac cath - see below - mild nonobstructive disease.Last visit with me was in January of 2018. Needed surgical clearance for shoulder surgery at that time. Was still smoking.   Comes back today. Here alone.She is doing ok. Her medicine list does not match up with ours here. She continues to smoke. She has had a chest cold. She broke her patella back in December - while at the salon getting haircut. She notes she has had more issues with her sodium level - has actually been in the hospital for this. She has had several UTI's. She is not restricting her fluid intake. She is on Hyzaar. She is also on Luvox which can cause this - she tells me that she has cut this dose back to just 2 pills daily. Says she just got her 10 year chip for not drinking. Headed to [place] next week. No chest pain. Breathing is fair. She is not active due to her orthopedic issues.    PMH: 1. GERD 2. HTN 3. Membranous VSD: Small.   4. Scoliosis 5. VT: s/p VT ablation in the early 1990s in [place].  6. Cardiomyopathy: Possibly related to HTN. This improved with control of BP. Last echo in 3/13 showed EF 50-55%, small perimembranous VSD, mild LVH, mild AI, PA systolic pressure 32 mmHg. Echo (2/15) with EF 45-50%, stable small perimembranous VSD, normal RV size and systolic function, mild aortic insufficiency.  7. Type II diabetes 8. Atypical chest pain: Lexiscan Cardiolite (2/15) with fixed small apical defect, no ischemia (low risk).  9. Bilateral THR  Past Medical History:  Diagnosis Date  . Anemia   . Anxiety   . Arthritis   . Depression   . GERD (gastroesophageal reflux disease)   . Heart murmur    "related to VSD"  . High cholesterol   . History of blood transfusion    "related to OR" (08/19/2016)  . History of hiatal hernia   . Hypertension   . Hyperthyroidism   . Paroxysmal ventricular tachycardia (HCC)   . Type II diabetes mellitus (HCC)   . UTI (urinary tract infection)    being treated with Keflex  . Ventricular septal defect     Past Surgical History:  Procedure Laterality Date  . BACK SURGERY    . CARDIAC CATHETERIZATION N/A 06/23/2015   Procedure: Left Heart Cath and Coronary Angiography;  Surgeon: Freida Busman  Alford Highland, MD;  Location: [facility] INVASIVE CV LAB;  Service: Cardiovascular;  Laterality: N/A;  . CARDIAC CATHETERIZATION  1960   "VSD was so small; didn't need repaired"  . EXAM UNDER ANESTHESIA WITH MANIPULATION OF HIP Right 06/02/2014   dr Jerl Santos  . FRACTURE SURGERY    . HERNIA REPAIR    . HIP CLOSED REDUCTION Right 06/02/2014   Procedure: CLOSED MANIPULATION HIP;  Surgeon: Velna Ochs, MD;  Location: [facility] OR;  Service: Orthopedics;  Laterality: Right;  . JOINT REPLACEMENT    . JOINT REPLACEMENT    . REFRACTIVE SURGERY Bilateral   . SHOULDER ARTHROSCOPY Right   . SHOULDER OPEN ROTATOR CUFF REPAIR Right   . SPINAL FUSION  1996   "t10 down to my coccyx  . SPINE HARDWARE  REMOVAL    . TOTAL ABDOMINAL HYSTERECTOMY    . TOTAL HIP ARTHROPLASTY Right 05/10/2014   [place]      by dr Carola Rhine  . TOTAL KNEE ARTHROPLASTY Left   . TOTAL KNEE ARTHROPLASTY Left   . TOTAL SHOULDER ARTHROPLASTY Left 08/19/2016  . TOTAL SHOULDER ARTHROPLASTY Left 08/19/2016   Procedure: TOTAL SHOULDER ARTHROPLASTY;  Surgeon: Jones Broom, MD;  Location: [facility] OR;  Service: Orthopedics;  Laterality: Left;  Left total shoulder replacement     Medications: No outpatient medications have been marked as taking for the 02/14/18 encounter (Appointment) with Rosalio Macadamia, NP.     Allergies: Allergies  Allergen Reactions  . No Known Allergies     Social History: The patient  reports that she has been smoking cigarettes.  She has a 25.00 pack-year smoking history. She has never used smokeless tobacco. She reports that she does not drink alcohol or use drugs.   Family History: The patient's ***family history includes Heart attack in her other; Other in her mother; Stroke (age of onset: 18) in her father.   Review of Systems: Please see the history of present illness.   Otherwise, the review of systems is positive for {NONE DEFAULTED:18576::"none"}.   All other systems are reviewed and negative.   Physical Exam: VS:  There were no vitals taken for this visit. [name]  BMI There is no height or weight on file to calculate BMI.  Wt Readings from Last 3 Encounters:  10/17/17 186 lb 1.9 oz (84.4 kg)  08/19/16 192 lb 5 oz (87.2 kg)  08/17/16 192 lb 4.8 oz (87.2 kg)    General: Pleasant. Well developed, well nourished and in no acute distress.   HEENT: Normal.  Neck: Supple, no JVD, carotid bruits, or masses noted.  Cardiac: ***Regular rate and rhythm. No murmurs, rubs, or gallops. No edema.  Respiratory:  Lungs are clear to auscultation bilaterally with normal work of breathing.  GI: Soft and nontender.  MS: No deformity or atrophy. Gait and ROM intact.  Skin: Warm and dry.  Color is normal.  Neuro:  Strength and sensation are intact and no gross focal deficits noted.  Psych: Alert, appropriate and with normal affect.   LABORATORY DATA:  EKG:  EKG {ACTION; IS/IS VZS:[id number]} ordered today. This demonstrates ***.  Lab Results  Component Value Date   WBC 12.9 (H) 08/20/2016   HGB 11.5 (L) 08/20/2016   HCT 34.0 (L) 08/20/2016   PLT 287 08/20/2016   GLUCOSE 176 (H) 11/04/2017   CHOL 97 (L) 10/17/2017   TRIG 115 10/17/2017   HDL 47 10/17/2017   LDLDIRECT 73.0 12/25/2014   LDLCALC 27 10/17/2017   ALT 63 (H)  11/04/2017   AST 55 (H) 11/04/2017   NA 142 11/04/2017   K 4.2 11/04/2017   CL 102 11/04/2017   CREATININE 0.73 11/04/2017   BUN 8 11/04/2017   CO2 23 11/04/2017   INR 0.93 08/17/2016   HGBA1C 6.5 (H) 08/17/2016     BNP (last 3 results) No results for input(s): BNP in the last 8760 hours.  ProBNP (last 3 results) No results for input(s): PROBNP in the last 8760 hours.   Other Studies Reviewed Today:   Assessment/Plan: Echo Study Conclusions 07/2016  - Left ventricle: The cavity size was normal. Wall thickness was normal. Systolic function was mildly reduced. The estimated ejection fraction was in the range of 45% to 50%. Diffuse hypokinesis. Doppler parameters are consistent with abnormal left ventricular relaxation (grade 1 diastolic dysfunction). - Ventricular septum: Small peri-membranous VSD noted with left to right flow. - Aortic valve: Trileaflet; calcified right coronary cusp. There was no stenosis. There was trivial regurgitation. - Aorta: Borderline dilated aortic root and ascending aorta. Aortic root dimension: 38 mm (ED). Ascending aortic diameter: 39 mm (S). - Mitral valve: There was trivial regurgitation. - Right ventricle: The cavity size was normal. Systolic function was normal. - Tricuspid valve: Peak RV-RA gradient (S): 23 mm Hg. - Pulmonary arteries: PA peak pressure: 26 mm Hg (S). - Inferior  vena cava: The vessel was normal in size. The respirophasic diameter changes were in the normal range (>= 50%), consistent with normal central venous pressure.  Impressions:  - Normal LV size with EF 45-50%, diffuse hypokinesis. Normal RV size and systolic function. There was a small peri-membranous VSD with left to right flow, this appears restrictive. Trivial aortic insufficiency.     Cardiac CathConclusionfrom 06/2015  Mild, nonobstructive CAD. Suspect non-cardiac chest pain     Assessment/Plan: 1.Hyponatremia - not doing fluid restriction and admits she is drinking lots of water/Gatorade. Recheck lab. Stop Hyzaar and change to Losartan 100 mg a day. Lab in one week.   2. CAD - mild and nonobstructive by cardiac cath from 2016 - needs CV risk factor modificationwhich has been challenging for her.She has no symptoms at this time.  3. Smoking: she is not ready to stop  4. Hyperlipidemia: on statin therapy - she has not eaten today - will check lab today  5. Perimembranous VSD: Soft murmur noted. This is small and has been known for a long time. Getting echo updated  6. Cardiomyopathy: updating her echo  7. HTN:  BP actually low - stopping HCTZ today - hopefully this will help her sodium level.   8. H/o VT: s/p ablation. No symptomatic recurrence.   9. Bipolar disorder - followed by psyche   Current medicines are reviewed with the patient today.  The patient does not have concerns regarding medicines other than what has been noted above.  The following changes have been made:  See above.  Labs/ tests ordered today include:   No orders of the defined types were placed in this encounter.    Disposition:   FU with *** in {gen number 9-56:213086} {Days to years:10300}.   Patient is agreeable to this plan and will call if any problems develop in the interim.   SignedNorma Fredrickson, NP  02/14/2018 7:39 AM  [facility] [address] [place], [place]  [address] Phone: [phone] Fax: [phone]

## 2018-02-27 ENCOUNTER — Inpatient Hospital Stay (HOSPITAL_COMMUNITY)
Admission: EM | Admit: 2018-02-27 | Discharge: 2018-02-28 | DRG: 641 | Disposition: A | Discharge status: Home / Self Care | Payer: Medicare Other | Attending: Internal Medicine | Admitting: Internal Medicine

## 2018-02-27 ENCOUNTER — Other Ambulatory Visit: Payer: Self-pay

## 2018-02-27 ENCOUNTER — Encounter (HOSPITAL_COMMUNITY): Payer: Self-pay | Admitting: Emergency Medicine

## 2018-02-27 ENCOUNTER — Emergency Department (HOSPITAL_COMMUNITY): Payer: Medicare Other

## 2018-02-27 DIAGNOSIS — F17210 Nicotine dependence, cigarettes, uncomplicated: Secondary | ICD-10-CM | POA: Diagnosis present

## 2018-02-27 DIAGNOSIS — F319 Bipolar disorder, unspecified: Secondary | ICD-10-CM | POA: Diagnosis not present

## 2018-02-27 DIAGNOSIS — I471 Supraventricular tachycardia: Secondary | ICD-10-CM | POA: Diagnosis not present

## 2018-02-27 DIAGNOSIS — Z96641 Presence of right artificial hip joint: Secondary | ICD-10-CM | POA: Diagnosis not present

## 2018-02-27 DIAGNOSIS — E039 Hypothyroidism, unspecified: Secondary | ICD-10-CM | POA: Diagnosis present

## 2018-02-27 DIAGNOSIS — I472 Ventricular tachycardia: Secondary | ICD-10-CM

## 2018-02-27 DIAGNOSIS — Z7982 Long term (current) use of aspirin: Secondary | ICD-10-CM | POA: Diagnosis not present

## 2018-02-27 DIAGNOSIS — E871 Hypo-osmolality and hyponatremia: Secondary | ICD-10-CM | POA: Diagnosis not present

## 2018-02-27 DIAGNOSIS — I429 Cardiomyopathy, unspecified: Secondary | ICD-10-CM | POA: Diagnosis present

## 2018-02-27 DIAGNOSIS — E785 Hyperlipidemia, unspecified: Secondary | ICD-10-CM | POA: Diagnosis present

## 2018-02-27 DIAGNOSIS — R55 Syncope and collapse: Secondary | ICD-10-CM

## 2018-02-27 DIAGNOSIS — I10 Essential (primary) hypertension: Secondary | ICD-10-CM | POA: Diagnosis present

## 2018-02-27 DIAGNOSIS — Z79891 Long term (current) use of opiate analgesic: Secondary | ICD-10-CM | POA: Diagnosis not present

## 2018-02-27 DIAGNOSIS — Z96652 Presence of left artificial knee joint: Secondary | ICD-10-CM | POA: Diagnosis present

## 2018-02-27 DIAGNOSIS — Q210 Ventricular septal defect: Secondary | ICD-10-CM | POA: Diagnosis not present

## 2018-02-27 DIAGNOSIS — R42 Dizziness and giddiness: Secondary | ICD-10-CM | POA: Diagnosis present

## 2018-02-27 DIAGNOSIS — Z79890 Hormone replacement therapy (postmenopausal): Secondary | ICD-10-CM | POA: Diagnosis not present

## 2018-02-27 DIAGNOSIS — Z7984 Long term (current) use of oral hypoglycemic drugs: Secondary | ICD-10-CM | POA: Diagnosis not present

## 2018-02-27 DIAGNOSIS — Z79899 Other long term (current) drug therapy: Secondary | ICD-10-CM

## 2018-02-27 DIAGNOSIS — I4729 Other ventricular tachycardia: Secondary | ICD-10-CM

## 2018-02-27 DIAGNOSIS — Z823 Family history of stroke: Secondary | ICD-10-CM | POA: Diagnosis not present

## 2018-02-27 DIAGNOSIS — I959 Hypotension, unspecified: Secondary | ICD-10-CM | POA: Diagnosis present

## 2018-02-27 DIAGNOSIS — E119 Type 2 diabetes mellitus without complications: Secondary | ICD-10-CM | POA: Diagnosis present

## 2018-02-27 DIAGNOSIS — K219 Gastro-esophageal reflux disease without esophagitis: Secondary | ICD-10-CM | POA: Diagnosis not present

## 2018-02-27 DIAGNOSIS — F419 Anxiety disorder, unspecified: Secondary | ICD-10-CM | POA: Diagnosis present

## 2018-02-27 DIAGNOSIS — Z96612 Presence of left artificial shoulder joint: Secondary | ICD-10-CM | POA: Diagnosis not present

## 2018-02-27 DIAGNOSIS — E7800 Pure hypercholesterolemia, unspecified: Secondary | ICD-10-CM | POA: Diagnosis present

## 2018-02-27 LAB — BASIC METABOLIC PANEL
Anion gap: 13 (ref 5–15)
BUN: 13 mg/dL (ref 8–23)
CALCIUM: 8.3 mg/dL — AB (ref 8.9–10.3)
CO2: 20 mmol/L — ABNORMAL LOW (ref 22–32)
CREATININE: 0.9 mg/dL (ref 0.44–1.00)
Chloride: 87 mmol/L — ABNORMAL LOW (ref 98–111)
GLUCOSE: 178 mg/dL — AB (ref 70–99)
Potassium: 3.7 mmol/L (ref 3.5–5.1)
Sodium: 120 mmol/L — ABNORMAL LOW (ref 135–145)

## 2018-02-27 LAB — URINALYSIS, ROUTINE W REFLEX MICROSCOPIC
BILIRUBIN URINE: NEGATIVE
Glucose, UA: NEGATIVE mg/dL
HGB URINE DIPSTICK: NEGATIVE
KETONES UR: NEGATIVE mg/dL
Leukocytes, UA: NEGATIVE
NITRITE: NEGATIVE
PROTEIN: NEGATIVE mg/dL
SPECIFIC GRAVITY, URINE: 1.002 — AB (ref 1.005–1.030)
pH: 5 (ref 5.0–8.0)

## 2018-02-27 LAB — CBC
HCT: 40.9 % (ref 36.0–46.0)
HEMOGLOBIN: 13.4 g/dL (ref 12.0–15.0)
MCH: 31.8 pg (ref 26.0–34.0)
MCHC: 32.8 g/dL (ref 30.0–36.0)
MCV: 96.9 fL (ref 78.0–100.0)
PLATELETS: 406 10*3/uL — AB (ref 150–400)
RBC: 4.22 MIL/uL (ref 3.87–5.11)
RDW: 13.3 % (ref 11.5–15.5)
WBC: 20.5 10*3/uL — ABNORMAL HIGH (ref 4.0–10.5)

## 2018-02-27 MED ORDER — SODIUM CHLORIDE 0.9 % IV SOLN
INTRAVENOUS | Status: DC
  Administered 2018-02-28: 02:00:00 via INTRAVENOUS

## 2018-02-27 NOTE — ED Notes (Signed)
Patient transported to X-ray 

## 2018-02-27 NOTE — ED Triage Notes (Addendum)
Patient c/o passing out after drinking 6 bottles of water and a bout of diarrhea. Also being seen by primary care phys for chronic UTI. States that she cannot talk, cannot think straight, feels sleepy and has generalized weakness. States that she has a hx of low sodium.

## 2018-02-28 ENCOUNTER — Encounter (HOSPITAL_COMMUNITY): Payer: Self-pay | Admitting: Emergency Medicine

## 2018-02-28 DIAGNOSIS — F319 Bipolar disorder, unspecified: Secondary | ICD-10-CM | POA: Diagnosis not present

## 2018-02-28 DIAGNOSIS — E039 Hypothyroidism, unspecified: Secondary | ICD-10-CM | POA: Diagnosis not present

## 2018-02-28 DIAGNOSIS — K219 Gastro-esophageal reflux disease without esophagitis: Secondary | ICD-10-CM | POA: Diagnosis not present

## 2018-02-28 DIAGNOSIS — E119 Type 2 diabetes mellitus without complications: Secondary | ICD-10-CM

## 2018-02-28 DIAGNOSIS — E7800 Pure hypercholesterolemia, unspecified: Secondary | ICD-10-CM | POA: Diagnosis not present

## 2018-02-28 DIAGNOSIS — E785 Hyperlipidemia, unspecified: Secondary | ICD-10-CM | POA: Diagnosis not present

## 2018-02-28 DIAGNOSIS — Q210 Ventricular septal defect: Secondary | ICD-10-CM

## 2018-02-28 DIAGNOSIS — I472 Ventricular tachycardia: Secondary | ICD-10-CM

## 2018-02-28 DIAGNOSIS — R42 Dizziness and giddiness: Secondary | ICD-10-CM | POA: Diagnosis not present

## 2018-02-28 DIAGNOSIS — I471 Supraventricular tachycardia: Secondary | ICD-10-CM | POA: Diagnosis not present

## 2018-02-28 DIAGNOSIS — E871 Hypo-osmolality and hyponatremia: Secondary | ICD-10-CM | POA: Diagnosis present

## 2018-02-28 DIAGNOSIS — F17210 Nicotine dependence, cigarettes, uncomplicated: Secondary | ICD-10-CM | POA: Diagnosis not present

## 2018-02-28 DIAGNOSIS — I10 Essential (primary) hypertension: Secondary | ICD-10-CM | POA: Diagnosis not present

## 2018-02-28 DIAGNOSIS — Z79890 Hormone replacement therapy (postmenopausal): Secondary | ICD-10-CM | POA: Diagnosis not present

## 2018-02-28 DIAGNOSIS — I429 Cardiomyopathy, unspecified: Secondary | ICD-10-CM | POA: Diagnosis not present

## 2018-02-28 DIAGNOSIS — Z7984 Long term (current) use of oral hypoglycemic drugs: Secondary | ICD-10-CM | POA: Diagnosis not present

## 2018-02-28 DIAGNOSIS — I959 Hypotension, unspecified: Secondary | ICD-10-CM | POA: Diagnosis not present

## 2018-02-28 DIAGNOSIS — F419 Anxiety disorder, unspecified: Secondary | ICD-10-CM | POA: Diagnosis not present

## 2018-02-28 DIAGNOSIS — Z79899 Other long term (current) drug therapy: Secondary | ICD-10-CM | POA: Diagnosis not present

## 2018-02-28 LAB — BASIC METABOLIC PANEL
ANION GAP: 9 (ref 5–15)
BUN: 6 mg/dL — AB (ref 8–23)
CHLORIDE: 102 mmol/L (ref 98–111)
CO2: 23 mmol/L (ref 22–32)
Calcium: 8.6 mg/dL — ABNORMAL LOW (ref 8.9–10.3)
Creatinine, Ser: 0.71 mg/dL (ref 0.44–1.00)
GFR calc Af Amer: >60 mL/min (ref >60)
GFR calc non Af Amer: >60 mL/min (ref >60)
GLUCOSE: 184 mg/dL — AB (ref 70–99)
POTASSIUM: 3.9 mmol/L (ref 3.5–5.1)
Sodium: 134 mmol/L — ABNORMAL LOW (ref 135–145)

## 2018-02-28 LAB — CBC
HCT: 38.7 % (ref 36.0–46.0)
HCT: 39.5 % (ref 36.0–46.0)
HEMOGLOBIN: 13.1 g/dL (ref 12.0–15.0)
HEMOGLOBIN: 13.2 g/dL (ref 12.0–15.0)
MCH: 31.3 pg (ref 26.0–34.0)
MCH: 31.9 pg (ref 26.0–34.0)
MCHC: 33.9 g/dL (ref 30.0–36.0)
MCHC: 33.4 g/dL (ref 30.0–36.0)
MCV: 92.6 fL (ref 78.0–100.0)
MCV: 95.4 fL (ref 78.0–100.0)
Platelets: 353 10*3/uL (ref 150–400)
Platelets: 326 10*3/uL (ref 150–400)
RBC: 4.18 MIL/uL (ref 3.87–5.11)
RBC: 4.14 MIL/uL (ref 3.87–5.11)
RDW: 13 % (ref 11.5–15.5)
RDW: 13.2 % (ref 11.5–15.5)
WBC: 14.5 10*3/uL — ABNORMAL HIGH (ref 4.0–10.5)
WBC: 11.3 10*3/uL — ABNORMAL HIGH (ref 4.0–10.5)

## 2018-02-28 LAB — COMPREHENSIVE METABOLIC PANEL
ALBUMIN: 3.8 g/dL (ref 3.5–5.0)
ALT: 27 U/L (ref 0–44)
ANION GAP: 10 (ref 5–15)
AST: 32 U/L (ref 15–41)
Alkaline Phosphatase: 116 U/L (ref 38–126)
BUN: 11 mg/dL (ref 8–23)
CO2: 25 mmol/L (ref 22–32)
Calcium: 8.8 mg/dL — ABNORMAL LOW (ref 8.9–10.3)
Chloride: 94 mmol/L — ABNORMAL LOW (ref 98–111)
Creatinine, Ser: 0.77 mg/dL (ref 0.44–1.00)
GFR calc Af Amer: >60 mL/min (ref >60)
GFR calc non Af Amer: >60 mL/min (ref >60)
GLUCOSE: 150 mg/dL — AB (ref 70–99)
POTASSIUM: 3.5 mmol/L (ref 3.5–5.1)
SODIUM: 129 mmol/L — AB (ref 135–145)
TOTAL PROTEIN: 6.7 g/dL (ref 6.5–8.1)
Total Bilirubin: 0.5 mg/dL (ref 0.3–1.2)

## 2018-02-28 LAB — CORTISOL: Cortisol, Plasma: 7.8 ug/dL

## 2018-02-28 LAB — OSMOLALITY, URINE: Osmolality, Ur: 93 mOsm/kg — ABNORMAL LOW (ref 300–900)

## 2018-02-28 LAB — SALICYLATE LEVEL: Salicylate Lvl: <7 mg/dL (ref 2.8–30.0)

## 2018-02-28 LAB — ACETAMINOPHEN LEVEL: Acetaminophen (Tylenol), Serum: <10 ug/mL — ABNORMAL LOW (ref 10–30)

## 2018-02-28 LAB — SODIUM, URINE, RANDOM: Sodium, Ur: 22 mmol/L

## 2018-02-28 LAB — HIV ANTIBODY (ROUTINE TESTING W REFLEX): HIV Screen 4th Generation wRfx: NONREACTIVE

## 2018-02-28 LAB — I-STAT TROPONIN, ED: TROPONIN I, POC: 0.01 ng/mL (ref 0.00–0.08)

## 2018-02-28 LAB — TSH: TSH: 1.636 u[IU]/mL (ref 0.350–4.500)

## 2018-02-28 LAB — OSMOLALITY: OSMOLALITY: 272 mosm/kg — AB (ref 275–295)

## 2018-02-28 LAB — ETHANOL

## 2018-02-28 LAB — GLUCOSE, CAPILLARY: Glucose-Capillary: 147 mg/dL — ABNORMAL HIGH (ref 70–99)

## 2018-02-28 MED ORDER — PANTOPRAZOLE SODIUM 40 MG PO TBEC: 40.0000 mg | DELAYED_RELEASE_TABLET | Freq: Every evening | ORAL | Status: DC

## 2018-02-28 MED ORDER — FLUVOXAMINE MALEATE 100 MG PO TABS
300.0000 mg | ORAL_TABLET | Freq: Every day | ORAL | Status: DC
  Administered 2018-02-28: 300 mg via ORAL
  Filled 2018-02-28: qty 3

## 2018-02-28 MED ORDER — INSULIN ASPART 100 UNIT/ML ~~LOC~~ SOLN: 0.0000 [IU] | Freq: Three times a day (TID) | SUBCUTANEOUS | Status: DC

## 2018-02-28 MED ORDER — LEVOTHYROXINE SODIUM 50 MCG PO TABS
50.0000 ug | ORAL_TABLET | Freq: Every day | ORAL | Status: DC
  Administered 2018-02-28: 50 ug via ORAL
  Filled 2018-02-28: qty 1

## 2018-02-28 MED ORDER — ENOXAPARIN SODIUM 30 MG/0.3ML ~~LOC~~ SOLN: 30.0000 mg | SUBCUTANEOUS | Status: DC

## 2018-02-28 MED ORDER — ROSUVASTATIN CALCIUM 20 MG PO TABS
20.0000 mg | ORAL_TABLET | Freq: Every day | ORAL | Status: DC
  Administered 2018-02-28: 20 mg via ORAL
  Filled 2018-02-28: qty 1

## 2018-02-28 MED ORDER — PREGABALIN 100 MG PO CAPS
100.0000 mg | ORAL_CAPSULE | Freq: Two times a day (BID) | ORAL | Status: DC
  Administered 2018-02-28 (×2): 100 mg via ORAL
  Filled 2018-02-28 (×2): qty 1

## 2018-02-28 MED ORDER — SODIUM CHLORIDE 0.9 % IV SOLN
INTRAVENOUS | Status: AC
  Administered 2018-02-28 (×2): via INTRAVENOUS

## 2018-02-28 MED ORDER — OXYCODONE-ACETAMINOPHEN 7.5-325 MG PO TABS
1.0000 | ORAL_TABLET | ORAL | Status: DC | PRN
  Administered 2018-02-28: 2 via ORAL
  Administered 2018-02-28: 1 via ORAL
  Filled 2018-02-28: qty 1
  Filled 2018-02-28 (×2): qty 2

## 2018-02-28 MED ORDER — METFORMIN HCL 500 MG PO TABS
500.0000 mg | ORAL_TABLET | Freq: Two times a day (BID) | ORAL | Status: DC
  Filled 2018-02-28: qty 1

## 2018-02-28 MED ORDER — ADULT MULTIVITAMIN W/MINERALS CH
1.0000 | ORAL_TABLET | Freq: Every day | ORAL | Status: DC
  Administered 2018-02-28: 1 via ORAL
  Filled 2018-02-28: qty 1

## 2018-02-28 MED ORDER — LAMOTRIGINE 25 MG PO TABS
25.0000 mg | ORAL_TABLET | Freq: Two times a day (BID) | ORAL | Status: DC
  Administered 2018-02-28 (×2): 25 mg via ORAL
  Filled 2018-02-28 (×2): qty 1

## 2018-02-28 MED ORDER — ACETAMINOPHEN 325 MG PO TABS: 650.0000 mg | ORAL_TABLET | Freq: Four times a day (QID) | ORAL | Status: DC | PRN

## 2018-02-28 MED ORDER — CARVEDILOL 3.125 MG PO TABS
3.1250 mg | ORAL_TABLET | Freq: Two times a day (BID) | ORAL | Status: DC
  Administered 2018-02-28: 3.125 mg via ORAL
  Filled 2018-02-28 (×2): qty 1

## 2018-02-28 MED ORDER — LOSARTAN POTASSIUM 50 MG PO TABS
100.0000 mg | ORAL_TABLET | Freq: Every day | ORAL | Status: DC
  Administered 2018-02-28: 100 mg via ORAL
  Filled 2018-02-28: qty 2

## 2018-02-28 MED ORDER — INSULIN ASPART 100 UNIT/ML ~~LOC~~ SOLN: 0.0000 [IU] | Freq: Every day | SUBCUTANEOUS | Status: DC

## 2018-02-28 MED ORDER — QUETIAPINE FUMARATE 300 MG PO TABS
300.0000 mg | ORAL_TABLET | Freq: Every day | ORAL | Status: DC
  Administered 2018-02-28: 300 mg via ORAL
  Filled 2018-02-28: qty 1

## 2018-02-28 MED ORDER — ACETAMINOPHEN 650 MG RE SUPP: 650.0000 mg | Freq: Four times a day (QID) | RECTAL | Status: DC | PRN

## 2018-02-28 MED ORDER — ASPIRIN EC 81 MG PO TBEC
81.0000 mg | DELAYED_RELEASE_TABLET | Freq: Every day | ORAL | Status: DC
  Administered 2018-02-28: 81 mg via ORAL
  Filled 2018-02-28: qty 1

## 2018-02-28 MED ORDER — BUPROPION HCL ER (XL) 150 MG PO TB24
300.0000 mg | ORAL_TABLET | Freq: Every day | ORAL | Status: DC
  Administered 2018-02-28: 300 mg via ORAL
  Filled 2018-02-28: qty 2

## 2018-02-28 NOTE — ED Provider Notes (Signed)
[facility] EMERGENCY DEPARTMENT Provider Note   CSN: [id number] Arrival date & time: 02/27/18  1857     History   Chief Complaint Chief Complaint  Patient presents with  . Loss of Consciousness    HPI [name] is a 62 y.o. female.  The history is provided by the patient. No language interpreter was used.  Loss of Consciousness   This is a recurrent problem. The current episode started 3 to 5 hours ago. The problem occurs constantly. The problem has been resolved. She lost consciousness for a period of less than one minute. The problem is associated with normal activity. Associated symptoms include confusion. Pertinent negatives include abdominal pain, back pain, bladder incontinence, bowel incontinence, chest pain, clumsiness, congestion, diaphoresis, dizziness, fever, focal sensory loss, headaches, nausea, palpitations, visual change, vomiting and weakness. She has tried nothing for the symptoms. The treatment provided no relief. Her past medical history does not include TIA.    Past Medical History:  Diagnosis Date  . Anemia   . Anxiety   . Arthritis   . Depression   . GERD (gastroesophageal reflux disease)   . Heart murmur    "related to VSD"  . High cholesterol   . History of blood transfusion    "related to OR" (08/19/2016)  . History of hiatal hernia   . Hypertension   . Hyperthyroidism   . Paroxysmal ventricular tachycardia (HCC)   . Type II diabetes mellitus (HCC)   . UTI (urinary tract infection)    being treated with Keflex  . Ventricular septal defect     Patient Active Problem List   Diagnosis Date Noted  . S/P shoulder replacement, left 08/19/2016  . Anxiety 10/17/2014  . Acid reflux 10/17/2014  . BP (high blood pressure) 10/17/2014  . Arthritis, degenerative 10/17/2014  . Adult hypothyroidism 10/17/2014  . UTI (urinary tract infection) 06/03/2014  . Fracture of bone adjacent to prosthesis 06/03/2014  . Diabetes (HCC)  06/02/2014  . Peri-prosthetic fracture of femur following total hip arthroplasty 06/02/2014  . Bladder retention 05/13/2014  . Chronic pain 05/10/2014  . History of hip surgery 05/10/2014  . UNSPECIFIED HEART FAILURE 06/24/2010  . UNSPECIFIED CONGENITAL DEFECT OF SEPTAL CLOSURE 06/24/2010  . TOBACCO ABUSE 03/18/2010  . Secondary cardiomyopathy (HCC) 03/18/2010  . DM 06/13/2009  . HYPERTENSION, UNSPECIFIED 06/13/2009  . VENTRICULAR TACHYCARDIA 06/13/2009  . VENTRICULAR SEPTAL DEFECT, CONGENITAL 06/13/2009  . Diabetes mellitus, type 2 (HCC) 06/13/2009  . Essential (primary) hypertension 06/13/2009    Past Surgical History:  Procedure Laterality Date  . BACK SURGERY    . CARDIAC CATHETERIZATION N/A 06/23/2015   Procedure: Left Heart Cath and Coronary Angiography;  Surgeon: Laurey Morale, MD;  Location: [facility] INVASIVE CV LAB;  Service: Cardiovascular;  Laterality: N/A;  . CARDIAC CATHETERIZATION  1960   "VSD was so small; didn't need repaired"  . EXAM UNDER ANESTHESIA WITH MANIPULATION OF HIP Right 06/02/2014   dr Jerl Santos  . FRACTURE SURGERY    . HERNIA REPAIR    . HIP CLOSED REDUCTION Right 06/02/2014   Procedure: CLOSED MANIPULATION HIP;  Surgeon: Velna Ochs, MD;  Location: [facility] OR;  Service: Orthopedics;  Laterality: Right;  . JOINT REPLACEMENT    . JOINT REPLACEMENT    . REFRACTIVE SURGERY Bilateral   . SHOULDER ARTHROSCOPY Right   . SHOULDER OPEN ROTATOR CUFF REPAIR Right   . SPINAL FUSION  1996   "t10 down to my coccyx  . SPINE HARDWARE REMOVAL    .  TOTAL ABDOMINAL HYSTERECTOMY    . TOTAL HIP ARTHROPLASTY Right 05/10/2014   [place]      by dr Carola Rhine  . TOTAL KNEE ARTHROPLASTY Left   . TOTAL KNEE ARTHROPLASTY Left   . TOTAL SHOULDER ARTHROPLASTY Left 08/19/2016  . TOTAL SHOULDER ARTHROPLASTY Left 08/19/2016   Procedure: TOTAL SHOULDER ARTHROPLASTY;  Surgeon: Jones Broom, MD;  Location: [facility] OR;  Service: Orthopedics;  Laterality: Left;  Left total  shoulder replacement     OB History   None      Home Medications    Prior to Admission medications   Medication Sig Start Date End Date Taking? Authorizing Provider  amoxicillin (AMOXIL) 500 MG capsule Take 2,000 mg by mouth once. Take 4 capsules one hour prior to dental appointment   Yes [provider]  aspirin 81 MG tablet Take 81 mg by mouth daily.     Yes [provider]  buPROPion (WELLBUTRIN XL) 300 MG 24 hr tablet Take 300 mg by mouth daily.     Yes [provider]  carvedilol (COREG) 3.125 MG tablet Take 3.125 mg by mouth 2 (two) times daily. 06/02/16  Yes [provider]  fluvoxaMINE (LUVOX) 100 MG tablet Take 300 mg by mouth at bedtime.    Yes [provider]  lamoTRIgine (LAMICTAL) 25 MG tablet Take 25 mg by mouth 2 (two) times daily.   Yes [provider]  levothyroxine (SYNTHROID, LEVOTHROID) 50 MCG tablet Take 50 mcg by mouth daily before breakfast.   Yes [provider]  LORazepam (ATIVAN) 1 MG tablet Take 1-2 mg by mouth See admin instructions. One hour before dental appointment 06/30/16  Yes [provider]  losartan (COZAAR) 100 MG tablet Take 1 tablet (100 mg total) by mouth daily. 10/17/17 02/28/26 Yes [name], Jennet Maduro, NP  LYRICA 100 MG capsule Take 100 mg by mouth 2 (two) times daily. 05/21/15  Yes [provider]  metFORMIN (GLUCOPHAGE) 500 MG tablet Take 500 mg by mouth 2 (two) times daily with a meal.   Yes [provider]  Multiple Vitamin (MULTIVITAMIN) tablet Take 1 tablet by mouth daily.     Yes [provider]  omeprazole (PRILOSEC OTC) 20 MG tablet Take 20 mg by mouth every evening.    Yes [provider]  oxyCODONE-acetaminophen (PERCOCET) 7.5-325 MG tablet Take 1-2 tablets by mouth every 4 (four) hours as needed for moderate pain. 08/20/16  Yes Jiles Harold, PA-C  QUEtiapine (SEROQUEL) 300 MG tablet Take 300 mg by mouth at bedtime.    Yes [provider]  rosuvastatin (CRESTOR) 20 MG tablet Take 20 mg by mouth at bedtime. 05/28/16  Yes [provider]  amLODipine (NORVASC) 5 MG tablet Take 5 mg by mouth daily.    [provider]  metFORMIN (GLUCOPHAGE) 1000 MG tablet Take 1,000 mg by mouth 2 (two) times daily.     [provider]  promethazine (PHENERGAN) 25 MG tablet Take 25 mg by mouth every 6 (six) hours as needed for nausea or vomiting.    [provider]    Family History Family History  Problem Relation Age of Onset  . Stroke Father 48       deceased  . Other Mother        alive  . Heart attack Other        multiple uncles have died with myocardial infarction    Social History Social History   Tobacco Use  . Smoking status:  Current Every Day Smoker    Packs/day: 0.50    Years: 50.00    Pack years: 25.00    Types: Cigarettes  . Smokeless tobacco: Never Used  Substance Use Topics  . Alcohol use: No  . Drug use: No     Allergies   No known allergies   Review of Systems Review of Systems  Constitutional: Negative for diaphoresis and fever.  HENT: Negative for congestion.   Cardiovascular: Positive for syncope. Negative for chest pain and palpitations.  Gastrointestinal: Negative for abdominal pain, bowel incontinence, nausea and vomiting.  Genitourinary: Negative for bladder incontinence.  Musculoskeletal: Negative for back pain.  Neurological: Negative for dizziness, weakness and headaches.  Psychiatric/Behavioral: Positive for confusion.  All other systems reviewed and are negative.    Physical Exam Updated Vital Signs BP 134/79 (BP Location: Left Arm)   Pulse 81   Temp 99.1 F (37.3 C)   Resp 15   Ht 5\' 1"  (1.549 m)   Wt 76.7 kg   SpO2 97%   BMI 31.93 kg/m   Physical Exam  Constitutional: She is oriented to person, place, and time. She appears well-developed and well-nourished. No distress.  HENT:  Head: Normocephalic and atraumatic.    Mouth/Throat: No oropharyngeal exudate.  Eyes: Pupils are equal, round, and reactive to light. Conjunctivae are normal.  Neck: Normal range of motion. Neck supple.  Cardiovascular: Normal rate, regular rhythm, normal heart sounds and intact distal pulses.  Pulmonary/Chest: Effort normal and breath sounds normal. No stridor. She has no wheezes. She has no rales.  Abdominal: Soft. Bowel sounds are normal. She exhibits no mass. There is no tenderness. There is no rebound and no guarding.  Musculoskeletal: Normal range of motion.  Neurological: She is alert and oriented to person, place, and time. She displays normal reflexes. No cranial nerve deficit or sensory deficit. She exhibits normal muscle tone. Coordination normal.  Skin: Skin is warm and dry. Capillary refill takes less than 2 seconds.  Psychiatric: She has a normal mood and affect.     ED Treatments / Results  Labs (all labs ordered are listed, but only abnormal results are displayed) Labs Reviewed  BASIC METABOLIC PANEL - Abnormal; Notable for the following components:      Result Value   Sodium 120 (*)    Chloride 87 (*)    CO2 20 (*)    Glucose, Bld 178 (*)    Calcium 8.3 (*)    All other components within normal limits  CBC - Abnormal; Notable for the following components:   WBC 20.5 (*)    Platelets 406 (*)    All other components within normal limits  URINALYSIS, ROUTINE W REFLEX MICROSCOPIC - Abnormal; Notable for the following components:   Color, Urine COLORLESS (*)    Specific Gravity, Urine 1.002 (*)    All other components within normal limits  ETHANOL  ACETAMINOPHEN LEVEL  SALICYLATE LEVEL  I-STAT TROPONIN, ED    EKG EKG Interpretation  Date/Time:  Monday February 27 2018 19:00:43 EDT Ventricular Rate:  80 PR Interval:  178 QRS Duration: 118 QT Interval:  428 QTC Calculation: 493 R Axis:   -61 Text Interpretation:  Normal sinus rhythm Left anterior fascicular block Nonspecific T wave abnormality  Confirmed by Nicanor Alcon, Kendarious Gudino ([id number]) on 02/27/2018 11:18:38 PM   Radiology Dg Chest 2 View  Result Date: 02/27/2018 CLINICAL DATA:  Hyponatremia EXAM: CHEST - 2 VIEW COMPARISON:  05/19/2016 FINDINGS: Left shoulder replacement. Low lung volumes. Elevated left  diaphragm with subsegmental atelectasis at the left base. No focal consolidation or effusion. Stable heart size. No pneumothorax. IMPRESSION: No active cardiopulmonary disease. Low lung volumes with chronically elevated left diaphragm. Subsegmental atelectasis at the left base Electronically Signed   By: Jasmine Pang M.D.   On: 02/27/2018 23:56   Ct Head Wo Contrast  Result Date: 02/27/2018 CLINICAL DATA:  Acute onset of dizziness and difficulty walking that began earlier today. EXAM: CT HEAD WITHOUT CONTRAST TECHNIQUE: Contiguous axial images were obtained from the base of the skull through the vertex without intravenous contrast. COMPARISON:  MRI brain 02/01/2017.  No prior head CT. FINDINGS: Brain: Ventricular system normal in size and appearance for age. Minimal to mild changes of small vessel disease of the white matter. No mass lesion. No midline shift. No acute hemorrhage or hematoma. No extra-axial fluid collections. No evidence of acute infarction. Note is made of a partial empty sella. Vascular: Mild BILATERAL carotid siphon atherosclerosis. No hyperdense vessel. Skull: No skull fracture or other focal osseous abnormality involving the skull. Sinuses/Orbits: Visualized paranasal sinuses, bilateral mastoid air cells and bilateral middle ear cavities well-aerated. Visualized orbits and globes normal in appearance. Other: None. IMPRESSION: 1. No acute intracranial abnormality. 2. Minimal to mild chronic microvascular ischemic changes of the white matter, unchanged since the MRI 1 year ago. Electronically Signed   By: Hulan Saas M.D.   On: 02/27/2018 21:08    Procedures Procedures (including critical care time)  Medications Ordered in  ED Medications  0.9 %  sodium chloride infusion (has no administration in time range)       Final Clinical Impressions(s) / ED Diagnoses   Final diagnoses:  Syncope and collapse  Hyponatremia    Will admit for hyponatremia....   Tandy Grawe, MD 02/28/18 0030

## 2018-02-28 NOTE — Discharge Summary (Signed)
Physician Discharge Summary  GOLDYE TOURANGEAU BJY:[id number] DOB: 09-03-1955 DOA: 02/27/2018  PCP: Elias Else, MD  Admit date: 02/27/2018 Discharge date: 02/28/2018  Admitted From: home  Disposition:  home   Recommendations for Outpatient Follow-up:  1. please f/u BP and Bmet    Discharge Condition:  stable   CODE STATUS:  Full code   Consultations:  none    Discharge Diagnoses:  Principal Problem:   Hyponatremia Active Problems:   VENTRICULAR TACHYCARDIA   VENTRICULAR SEPTAL DEFECT, CONGENITAL   Diabetes (HCC)   Essential (primary) hypertension   Adult hypothyroidism       Brief Summary: 62 y/o with HTN, HLD, DM2, PSVT, GERD, VSD and anemia presents due to dizziness and weakness.  She states she has has a h/o of sodium being about 130-135 at baseline.  She further states that she drank many bottles of Gator-Aid yesterday as she wanted to give a urine sample to her doctor. She thinks her sodium is low because of this.  Sodium 120 and WBC 20 - UA negative in ED. Head CT unrevealing - see report below  Hospital Course:  Hyponatremia- light headed, weak - ED has documented syncope on chart- she tells me that she layed down and fell asleep for about 30 min- she did state this a "passed out" on the bed - she actually denies syncope - hyponatremia suspected to be due to excess drinking of fluids per history - S osm 272, U osm 93 and U sodium 22 - NS given and sodium steadily improved to 134 today - she is insisting on going home- I have decided to allow her to leave as long as she has close f/u with her PCP- she has obtained an appt with her PCP for Friday at 4 PM  Hypotension with h/o HTN - last BP 109/67 today - cont Coreg due to h/o PSVT on chart and hold Norvasc and Cozaar for now- - she will check her BP at home and I recommended PCP to f/u BP on Friday  DM 2 - cont Metformin  PSVT/ Vent septal defect - stable- no issues noted in the hospital  GERD - cont  PPI     Discharge Exam: Vitals:   02/28/18 0139 02/28/18 0640  BP: (!) 141/94 109/67  Pulse: 78 70  Resp: 19 16  Temp: 98.4 F (36.9 C) 98.1 F (36.7 C)  SpO2: 99% 97%   Vitals:   02/28/18 0015 02/28/18 0015 02/28/18 0139 02/28/18 0640  BP: 137/85  (!) 141/94 109/67  Pulse: 86  78 70  Resp: 16  19 16   Temp:  99.1 F (37.3 C) 98.4 F (36.9 C) 98.1 F (36.7 C)  TempSrc:   Oral   SpO2: 97%  99% 97%  Weight:      Height:        General: Pt is alert, awake, not in acute distress Cardiovascular: RRR, S1/S2 +, no rubs, no gallops Respiratory: CTA bilaterally, no wheezing, no rhonchi Abdominal: Soft, NT, ND, bowel sounds + Extremities: no edema, no cyanosis   Discharge Instructions  Discharge Instructions    Diet - low sodium heart healthy   Complete by:  As directed    Discharge instructions   Complete by:  As directed    Norvasc and Cozaar held due to low BP. Please check your BP readings at home and have BP checked at PCP to determine if these medications need to be resumed.   You were cared for by a  hospitalist during your hospital stay. If you have any questions about your discharge medications or the care you received while you were in the hospital after you are discharged, you can call the unit and asked to speak with the hospitalist on call if the hospitalist that took care of you is not available. Once you are discharged, your primary care physician will handle any further medical issues.   Please note that NO REFILLS for any discharge medications will be authorized once you are discharged, as it is imperative that you return to your primary care physician (or establish a relationship with a primary care physician if you do not have one) for your aftercare needs so that they can reassess your need for medications and monitor your lab values.  Please take all your medications with you for your next visit with your Primary MD. Please ask your Primary MD to get all  Hospital records sent to his/her office. Please request your Primary MD to go over all hospital test results at the follow up.   If you experience worsening of your admission symptoms, develop shortness of breath, chest pain, suicidal or homicidal thoughts or a life threatening emergency, you must seek medical attention immediately by calling 911 or calling your MD.  Bonita Quin must read the complete instructions/literature along with all the possible adverse reactions/side effects for all the medicines you take including new medications that have been prescribed to you. Take new medicines after you have completely understood and accpet all the possible adverse reactions/side effects.   Do not drive when taking pain medications or sedatives.    Do not take more than prescribed Pain, Sleep and Anxiety Medications  If you have smoked or chewed Tobacco in the last 2 yrs please stop. Stop any regular alcohol and or recreational drug use.  Wear Seat belts while driving.   Increase activity slowly   Complete by:  As directed      Allergies as of 02/28/2018      Reactions   No Known Allergies       Medication List    STOP taking these medications   amLODipine 5 MG tablet Commonly known as:  NORVASC   losartan 100 MG tablet Commonly known as:  COZAAR     TAKE these medications   amoxicillin 500 MG capsule Commonly known as:  AMOXIL Take 2,000 mg by mouth once. Take 4 capsules one hour prior to dental appointment   aspirin 81 MG tablet Take 81 mg by mouth daily.   buPROPion 300 MG 24 hr tablet Commonly known as:  WELLBUTRIN XL Take 300 mg by mouth daily.   carvedilol 3.125 MG tablet Commonly known as:  COREG Take 3.125 mg by mouth 2 (two) times daily.   fluvoxaMINE 100 MG tablet Commonly known as:  LUVOX Take 300 mg by mouth at bedtime.   lamoTRIgine 25 MG tablet Commonly known as:  LAMICTAL Take 25 mg by mouth 2 (two) times daily.   levothyroxine 50 MCG tablet Commonly  known as:  SYNTHROID, LEVOTHROID Take 50 mcg by mouth daily before breakfast.   LORazepam 1 MG tablet Commonly known as:  ATIVAN Take 1-2 mg by mouth See admin instructions. One hour before dental appointment   LYRICA 100 MG capsule Generic drug:  pregabalin Take 100 mg by mouth 2 (two) times daily.   metFORMIN 500 MG tablet Commonly known as:  GLUCOPHAGE Take 500 mg by mouth 2 (two) times daily with a meal.   multivitamin tablet  Take 1 tablet by mouth daily.   omeprazole 20 MG tablet Commonly known as:  PRILOSEC OTC Take 20 mg by mouth every evening.   oxyCODONE-acetaminophen 7.5-325 MG tablet Commonly known as:  PERCOCET Take 1-2 tablets by mouth every 4 (four) hours as needed for moderate pain.   rosuvastatin 20 MG tablet Commonly known as:  CRESTOR Take 20 mg by mouth at bedtime.   SEROQUEL 300 MG tablet Generic drug:  QUEtiapine Take 300 mg by mouth at bedtime.      Follow-up Information    Elias Else, MD Follow up in 3 day(s).   Specialty:  Family Medicine Why:  please ask your PCP to check a Bmet and your BP in 3 days.  Contact information: [address] [place] [address] [phone]          Allergies  Allergen Reactions  . No Known Allergies      Procedures/Studies:   Dg Chest 2 View  Result Date: 02/27/2018 CLINICAL DATA:  Hyponatremia EXAM: CHEST - 2 VIEW COMPARISON:  05/19/2016 FINDINGS: Left shoulder replacement. Low lung volumes. Elevated left diaphragm with subsegmental atelectasis at the left base. No focal consolidation or effusion. Stable heart size. No pneumothorax. IMPRESSION: No active cardiopulmonary disease. Low lung volumes with chronically elevated left diaphragm. Subsegmental atelectasis at the left base Electronically Signed   By: Jasmine Pang M.D.   On: 02/27/2018 23:56   Ct Head Wo Contrast  Result Date: 02/27/2018 CLINICAL DATA:  Acute onset of dizziness and difficulty walking that began earlier  today. EXAM: CT HEAD WITHOUT CONTRAST TECHNIQUE: Contiguous axial images were obtained from the base of the skull through the vertex without intravenous contrast. COMPARISON:  MRI brain 02/01/2017.  No prior head CT. FINDINGS: Brain: Ventricular system normal in size and appearance for age. Minimal to mild changes of small vessel disease of the white matter. No mass lesion. No midline shift. No acute hemorrhage or hematoma. No extra-axial fluid collections. No evidence of acute infarction. Note is made of a partial empty sella. Vascular: Mild BILATERAL carotid siphon atherosclerosis. No hyperdense vessel. Skull: No skull fracture or other focal osseous abnormality involving the skull. Sinuses/Orbits: Visualized paranasal sinuses, bilateral mastoid air cells and bilateral middle ear cavities well-aerated. Visualized orbits and globes normal in appearance. Other: None. IMPRESSION: 1. No acute intracranial abnormality. 2. Minimal to mild chronic microvascular ischemic changes of the white matter, unchanged since the MRI 1 year ago. Electronically Signed   By: Hulan Saas M.D.   On: 02/27/2018 21:08     The results of significant diagnostics from this hospitalization (including imaging, microbiology, ancillary and laboratory) are listed below for reference.     Microbiology: No results found for this or any previous visit (from the past 240 hour(s)).   Labs: BNP (last 3 results) No results for input(s): BNP in the last 8760 hours. Basic Metabolic Panel: Recent Labs  Lab 02/27/18 1932 02/28/18 0150 02/28/18 1022  NA 120* 129* 134*  K 3.7 3.5 3.9  CL 87* 94* 102  CO2 20* 25 23  GLUCOSE 178* 150* 184*  BUN 13 11 6*  CREATININE 0.90 0.77 0.71  CALCIUM 8.3* 8.8* 8.6*   Liver Function Tests: Recent Labs  Lab 02/28/18 0150  AST 32  ALT 27  ALKPHOS 116  BILITOT 0.5  PROT 6.7  ALBUMIN 3.8   No results for input(s): LIPASE, AMYLASE in the last 168 hours. No results for input(s): AMMONIA  in the last 168  hours. CBC: Recent Labs  Lab 02/27/18 1932 02/28/18 0150 02/28/18 1022  WBC 20.5* 14.5* 11.3*  HGB 13.4 13.1 13.2  HCT 40.9 38.7 39.5  MCV 96.9 92.6 95.4  PLT 406* 353 326   Cardiac Enzymes: No results for input(s): CKTOTAL, CKMB, CKMBINDEX, TROPONINI in the last 168 hours. BNP: Invalid input(s): POCBNP CBG: Recent Labs  Lab 02/28/18 0151  GLUCAP 147*   D-Dimer No results for input(s): DDIMER in the last 72 hours. Hgb A1c No results for input(s): HGBA1C in the last 72 hours. Lipid Profile No results for input(s): CHOL, HDL, LDLCALC, TRIG, CHOLHDL, LDLDIRECT in the last 72 hours. Thyroid function studies Recent Labs    02/28/18 0150  TSH 1.636   Anemia work up No results for input(s): VITAMINB12, FOLATE, FERRITIN, TIBC, IRON, RETICCTPCT in the last 72 hours. Urinalysis    Component Value Date/Time   COLORURINE COLORLESS (A) 02/27/2018 1922   APPEARANCEUR CLEAR 02/27/2018 1922   LABSPEC 1.002 (L) 02/27/2018 1922   PHURINE 5.0 02/27/2018 1922   GLUCOSEU NEGATIVE 02/27/2018 1922   HGBUR NEGATIVE 02/27/2018 1922   BILIRUBINUR NEGATIVE 02/27/2018 1922   KETONESUR NEGATIVE 02/27/2018 1922   PROTEINUR NEGATIVE 02/27/2018 1922   UROBILINOGEN 0.2 06/02/2014 1705   NITRITE NEGATIVE 02/27/2018 1922   LEUKOCYTESUR NEGATIVE 02/27/2018 1922   Sepsis Labs Invalid input(s): PROCALCITONIN,  WBC,  LACTICIDVEN Microbiology No results found for this or any previous visit (from the past 240 hour(s)).   Time coordinating discharge in minutes: 65  SIGNED:   Calvert Cantor, MD  [facility] 02/28/2018, 12:32 PM Pager   If 7PM-7AM, please contact night-coverage [email or web address] Password [email or web address]

## 2018-02-28 NOTE — Discharge Instructions (Signed)

## 2018-02-28 NOTE — H&P (Addendum)
TRH H&P   Patient Demographics:    [name], is a 62 y.o. female  MRN: [id number]   DOB - October 18, 1955  Admit Date - 02/27/2018  Outpatient Primary MD for the patient is Elias Else, MD  Referring MD/NP/PA:   April Palumbo  Outpatient Specialists:   Patient coming from:   Chief Complaint  Patient presents with  . Loss of Consciousness      HPI:    Ceclia [name]  is a 62 y.o. female, w hypertension, hyperlipidemia, dm2, psvt, anemia, gerd, h/o hyponatremia over the past 2 years  presents with feeling of weakness.  and dizziness, ( denies syncope, fell asleep) she thought her sodium was low and therefore presented to ED.   In Ed,  Urinalysis negative Na 120, K 3.7, Bun 13, Creatinine 0.90 Wbc 20.5, hgb 13.4, Plt 406 Trop 0.01  CT brain IMPRESSION: 1. No acute intracranial abnormality. 2. Minimal to mild chronic microvascular ischemic changes of the white matter, unchanged since the MRI 1 year ago.  CXR IMPRESSION: No active cardiopulmonary disease. Low lung volumes with chronically elevated left diaphragm. Subsegmental atelectasis at the left base  Pt will be admitted for hyponatremia.          Review of systems:    In addition to the HPI above,  No Fever-chills, No Headache, No changes with Vision or hearing, No problems swallowing food or Liquids, No Chest pain, Cough or Shortness of Breath, No Abdominal pain, No Nausea or Vommitting, Bowel movements are regular, No Blood in stool or Urine, No dysuria, No new skin rashes or bruises, No new joints pains-aches,  No new weakness, tingling, numbness in any extremity, No recent weight gain or loss, No polyuria, polydypsia or polyphagia, No significant Mental Stressors.  A full 10 point Review of Systems was done, except as stated above, all other Review of Systems were negative.   With  Past History of the following :    Past Medical History:  Diagnosis Date  . Anemia   . Anxiety   . Arthritis   . Depression   . GERD (gastroesophageal reflux disease)   . Heart murmur    "related to VSD"  . High cholesterol   . History of blood transfusion    "related to OR" (08/19/2016)  . History of hiatal hernia   . Hypertension   . Hyperthyroidism   . Paroxysmal ventricular tachycardia (HCC)   . Type II diabetes mellitus (HCC)   . UTI (urinary tract infection)    being treated with Keflex  . Ventricular septal defect       Past Surgical History:  Procedure Laterality Date  . BACK SURGERY    . CARDIAC CATHETERIZATION N/A 06/23/2015   Procedure: Left Heart Cath and Coronary Angiography;  Surgeon: Laurey Morale, MD;  Location: [facility] INVASIVE CV LAB;  Service: Cardiovascular;  Laterality: N/A;  .  CARDIAC CATHETERIZATION  1960   "VSD was so small; didn't need repaired"  . EXAM UNDER ANESTHESIA WITH MANIPULATION OF HIP Right 06/02/2014   dr Jerl Santos  . FRACTURE SURGERY    . HERNIA REPAIR    . HIP CLOSED REDUCTION Right 06/02/2014   Procedure: CLOSED MANIPULATION HIP;  Surgeon: Velna Ochs, MD;  Location: [facility] OR;  Service: Orthopedics;  Laterality: Right;  . JOINT REPLACEMENT    . JOINT REPLACEMENT    . REFRACTIVE SURGERY Bilateral   . SHOULDER ARTHROSCOPY Right   . SHOULDER OPEN ROTATOR CUFF REPAIR Right   . SPINAL FUSION  1996   "t10 down to my coccyx  . SPINE HARDWARE REMOVAL    . TOTAL ABDOMINAL HYSTERECTOMY    . TOTAL HIP ARTHROPLASTY Right 05/10/2014   [place]      by dr Carola Rhine  . TOTAL KNEE ARTHROPLASTY Left   . TOTAL KNEE ARTHROPLASTY Left   . TOTAL SHOULDER ARTHROPLASTY Left 08/19/2016  . TOTAL SHOULDER ARTHROPLASTY Left 08/19/2016   Procedure: TOTAL SHOULDER ARTHROPLASTY;  Surgeon: Jones Broom, MD;  Location: [facility] OR;  Service: Orthopedics;  Laterality: Left;  Left total shoulder replacement      Social History:     Social History    Tobacco Use  . Smoking status: Current Every Day Smoker    Packs/day: 0.50    Years: 50.00    Pack years: 25.00    Types: Cigarettes  . Smokeless tobacco: Never Used  Substance Use Topics  . Alcohol use: No     Lives - at home  Mobility - walks by self   Family History :     Family History  Problem Relation Age of Onset  . Stroke Father 27       deceased  . Other Mother        alive  . Heart attack Other        multiple uncles have died with myocardial infarction       Home Medications:   Prior to Admission medications   Medication Sig Start Date End Date Taking? Authorizing Provider  amLODipine (NORVASC) 5 MG tablet Take 5 mg by mouth daily.   Yes [provider]  amoxicillin (AMOXIL) 500 MG capsule Take 2,000 mg by mouth once. Take 4 capsules one hour prior to dental appointment   Yes [provider]  aspirin 81 MG tablet Take 81 mg by mouth daily.     Yes [provider]  buPROPion (WELLBUTRIN XL) 300 MG 24 hr tablet Take 300 mg by mouth daily.     Yes [provider]  carvedilol (COREG) 3.125 MG tablet Take 3.125 mg by mouth 2 (two) times daily. 06/02/16  Yes [provider]  fluvoxaMINE (LUVOX) 100 MG tablet Take 300 mg by mouth at bedtime.    Yes [provider]  lamoTRIgine (LAMICTAL) 25 MG tablet Take 25 mg by mouth 2 (two) times daily.   Yes [provider]  levothyroxine (SYNTHROID, LEVOTHROID) 50 MCG tablet Take 50 mcg by mouth daily before breakfast.   Yes [provider]  LORazepam (ATIVAN) 1 MG tablet Take 1-2 mg by mouth See admin instructions. One hour before dental appointment 06/30/16  Yes [provider]  losartan (COZAAR) 100 MG tablet Take 1 tablet (100 mg total) by mouth daily. 10/17/17 02/28/26 Yes [name], Jennet Maduro, NP  LYRICA 100 MG capsule Take 100 mg by mouth 2 (two) times daily. 05/21/15  Yes [provider]  metFORMIN (GLUCOPHAGE) 500 MG tablet Take 500 mg  by mouth 2 (two) times daily with a meal.   Yes [provider]  Multiple Vitamin (MULTIVITAMIN) tablet Take 1 tablet by mouth daily.     Yes [provider]  omeprazole (PRILOSEC OTC) 20 MG tablet Take 20 mg by mouth every evening.    Yes [provider]  oxyCODONE-acetaminophen (PERCOCET) 7.5-325 MG tablet Take 1-2 tablets by mouth every 4 (four) hours as needed for moderate pain. 08/20/16  Yes Jiles Harold, PA-C  QUEtiapine (SEROQUEL) 300 MG tablet Take 300 mg by mouth at bedtime.    Yes [provider]  rosuvastatin (CRESTOR) 20 MG tablet Take 20 mg by mouth at bedtime. 05/28/16  Yes [provider]     Allergies:     Allergies  Allergen Reactions  . No Known Allergies      Physical Exam:   Vitals  Blood pressure 134/79, pulse 81, temperature 99.1 F (37.3 C), resp. rate 15, height 5\' 1"  (1.549 m), weight 76.7 kg, SpO2 97 %.   1. General lying in bed in NAD,   2. Normal affect and insight, Not Suicidal or Homicidal, Awake Alert, Oriented X 3.  3. No F.N deficits, ALL C.Nerves Intact, Strength 5/5 all 4 extremities, Sensation intact all 4 extremities, Plantars down going.  4. Ears and Eyes appear Normal, Conjunctivae clear, PERRLA. Moist Oral Mucosa.  5. Supple Neck, No JVD, No cervical lymphadenopathy appriciated, No Carotid Bruits.  6. Symmetrical Chest wall movement, Good air movement bilaterally, CTAB.  7. RRR, No Gallops, Rubs or Murmurs, No Parasternal Heave.  8. Positive Bowel Sounds, Abdomen Soft, No tenderness, No organomegaly appriciated,No rebound -guarding or rigidity.  9.  No Cyanosis, Normal Skin Turgor, No Skin Rash or Bruise.  10. Good muscle tone,  joints appear normal , no effusions, Normal ROM.  11. No Palpable Lymph Nodes in Neck or Axillae      Data Review:    CBC Recent Labs  Lab 02/27/18 1932  WBC 20.5*  HGB 13.4  HCT 40.9  PLT 406*  MCV 96.9  MCH 31.8  MCHC 32.8  RDW 13.3    ------------------------------------------------------------------------------------------------------------------  Chemistries  Recent Labs  Lab 02/27/18 1932  NA 120*  K 3.7  CL 87*  CO2 20*  GLUCOSE 178*  BUN 13  CREATININE 0.90  CALCIUM 8.3*   ------------------------------------------------------------------------------------------------------------------ estimated creatinine clearance is 60 mL/min (by C-G formula based on SCr of 0.9 mg/dL). ------------------------------------------------------------------------------------------------------------------ No results for input(s): TSH, T4TOTAL, T3FREE, THYROIDAB in the last 72 hours.  Invalid input(s): FREET3  Coagulation profile No results for input(s): INR, PROTIME in the last 168 hours. ------------------------------------------------------------------------------------------------------------------- No results for input(s): DDIMER in the last 72 hours. -------------------------------------------------------------------------------------------------------------------  Cardiac Enzymes No results for input(s): CKMB, TROPONINI, MYOGLOBIN in the last 168 hours.  Invalid input(s): CK ------------------------------------------------------------------------------------------------------------------ No results found for: BNP   ---------------------------------------------------------------------------------------------------------------  Urinalysis    Component Value Date/Time   COLORURINE COLORLESS (A) 02/27/2018 1922   APPEARANCEUR CLEAR 02/27/2018 1922   LABSPEC 1.002 (L) 02/27/2018 1922   PHURINE 5.0 02/27/2018 1922   GLUCOSEU NEGATIVE 02/27/2018 1922   HGBUR NEGATIVE 02/27/2018 1922   BILIRUBINUR NEGATIVE 02/27/2018 1922   KETONESUR NEGATIVE 02/27/2018 1922   PROTEINUR NEGATIVE 02/27/2018 1922   UROBILINOGEN 0.2 06/02/2014 1705   NITRITE NEGATIVE 02/27/2018 1922   LEUKOCYTESUR NEGATIVE 02/27/2018 1922     ----------------------------------------------------------------------------------------------------------------   Imaging Results:    Dg Chest 2 View  Result Date: 02/27/2018 CLINICAL  DATA:  Hyponatremia EXAM: CHEST - 2 VIEW COMPARISON:  05/19/2016 FINDINGS: Left shoulder replacement. Low lung volumes. Elevated left diaphragm with subsegmental atelectasis at the left base. No focal consolidation or effusion. Stable heart size. No pneumothorax. IMPRESSION: No active cardiopulmonary disease. Low lung volumes with chronically elevated left diaphragm. Subsegmental atelectasis at the left base Electronically Signed   By: Jasmine Pang M.D.   On: 02/27/2018 23:56   Ct Head Wo Contrast  Result Date: 02/27/2018 CLINICAL DATA:  Acute onset of dizziness and difficulty walking that began earlier today. EXAM: CT HEAD WITHOUT CONTRAST TECHNIQUE: Contiguous axial images were obtained from the base of the skull through the vertex without intravenous contrast. COMPARISON:  MRI brain 02/01/2017.  No prior head CT. FINDINGS: Brain: Ventricular system normal in size and appearance for age. Minimal to mild changes of small vessel disease of the white matter. No mass lesion. No midline shift. No acute hemorrhage or hematoma. No extra-axial fluid collections. No evidence of acute infarction. Note is made of a partial empty sella. Vascular: Mild BILATERAL carotid siphon atherosclerosis. No hyperdense vessel. Skull: No skull fracture or other focal osseous abnormality involving the skull. Sinuses/Orbits: Visualized paranasal sinuses, bilateral mastoid air cells and bilateral middle ear cavities well-aerated. Visualized orbits and globes normal in appearance. Other: None. IMPRESSION: 1. No acute intracranial abnormality. 2. Minimal to mild chronic microvascular ischemic changes of the white matter, unchanged since the MRI 1 year ago. Electronically Signed   By: Hulan Saas M.D.   On: 02/27/2018 21:08        Assessment & Plan:    Principal Problem:   Hyponatremia Active Problems:   Diabetes (HCC)   Essential (primary) hypertension   Adult hypothyroidism    Hyponatremia Check serum osm, cortisol, tsh Check urine osm, urine sodium Hydrate with ns iv  Check cmp in am  Dm2 Continue metformin 500mg  po bid fsbs ac and qhs, ISS  Hypothyroidism Cont levothyroxine  Hypertension Cont carvedilol 3.125mg  po bid Cont losartan 100mg  po qday  Hyperlipidemia Cont Crestor 20mg  po qhs  Bipolar do Cont wellbutrin XL 300mg  po qday Cont Luvox 300mg  po qday Cont Seroquel 300mg  po qhs  Gerd Cont PPI  DVT Prophylaxis   Lovenox - SCDs  AM Labs Ordered, also please review Full Orders  Family Communication: Admission, patients condition and plan of care including tests being ordered have been discussed with the patient  who indicate understanding and agree with the plan and Code Status.  Code Status  FULL CODE  Likely DC to  home  Condition GUARDED   Consults called:  none  Admission status:  inpatient  Time spent in minutes : 60   Pearson Grippe M.D on 02/28/2018 at 12:45 AM  Between 7am to 7pm - Pager - [phone]  . After 7pm go to [email or web address] - password [facility]  [facility] - Office  [phone]

## 2018-02-28 NOTE — Progress Notes (Signed)
[name] 62 y o f patient admitted from ED. Patient is alert and oriented x 4. Vital signs are stable. Iv in place and running fluid. Given schedule medicine as well as PRN medicine for pain. Skin assessment done with another nurse. Patient given instructions about call bell and phone. Bed in low position and call bell in reach.

## 2018-04-03 ENCOUNTER — Other Ambulatory Visit: Payer: Self-pay | Admitting: Gastroenterology

## 2018-04-03 DIAGNOSIS — R1319 Other dysphagia: Secondary | ICD-10-CM

## 2018-05-02 ENCOUNTER — Other Ambulatory Visit: Payer: Medicare Other

## 2018-05-29 ENCOUNTER — Other Ambulatory Visit: Payer: Self-pay | Admitting: Family Medicine

## 2018-05-29 DIAGNOSIS — Z1231 Encounter for screening mammogram for malignant neoplasm of breast: Secondary | ICD-10-CM

## 2018-07-24 ENCOUNTER — Ambulatory Visit: Payer: Medicare Other

## 2018-08-08 ENCOUNTER — Ambulatory Visit: Payer: Medicare Other

## 2018-08-15 ENCOUNTER — Ambulatory Visit: Payer: Medicare Other

## 2018-09-06 ENCOUNTER — Ambulatory Visit (INDEPENDENT_AMBULATORY_CARE_PROVIDER_SITE_OTHER): Payer: Medicare Other | Admitting: Neurology

## 2018-09-06 ENCOUNTER — Encounter: Payer: Self-pay | Admitting: Neurology

## 2018-09-06 ENCOUNTER — Other Ambulatory Visit: Payer: Self-pay | Admitting: Neurology

## 2018-09-06 DIAGNOSIS — G3184 Mild cognitive impairment, so stated: Secondary | ICD-10-CM

## 2018-09-06 HISTORY — DX: Mild cognitive impairment of uncertain or unknown etiology: G31.84

## 2018-09-06 MED ORDER — DONEPEZIL HCL 5 MG PO TABS: 5.0000 mg | ORAL_TABLET | Freq: Every day | ORAL | 1 refills | Status: DC

## 2018-09-06 NOTE — Progress Notes (Signed)
Reason for visit: Memory disturbance  Referring physician: Dr. [name] CLARINE [name] is a 63 y.o. female  History of present illness:  [name] is a 63 year old right-handed white female with a history of some issues with memory dating back approximately 2 years.  The patient is quite concerned that she may be developing Alzheimer's disease as she has a very prominent family history for Alzheimer's, she claims that her maternal grandfather, mother and 2 maternal aunts had Alzheimer's disease.  The patient indicates that she has a brother and a sister who also have some troubles with memory.  The patient reports that she has difficulty with short-term and long-term memory, she has difficulty remembering names for people.  She has always been challenged with directions while driving, this has not changed any.  She has not been unsafe with driving.  She is able to keep up with her own medications and appointments, she is able to do her own finances fairly well.  The patient has been diagnosed with bipolar disorder, she mainly has depression.  She is on Seroquel for this, she has chronic pain issues as well and she does not sleep well at night, she takes Lyrica and is followed through a pain center with Dr. Jordan Likes.  The patient reports some gait problems associated with a leg length discrepancy, she has a much shorter right leg than the left.  She walks with a cane.  She feels weak all over, she does have some right foot numbness.  She denies any significant problems with headaches or dizziness.  She comes to this office for an evaluation.  Past Medical History:  Diagnosis Date  . Anemia   . Anxiety   . Arthritis   . Depression   . GERD (gastroesophageal reflux disease)   . Heart murmur    "related to VSD"  . High cholesterol   . History of blood transfusion    "related to OR" (08/19/2016)  . History of hiatal hernia   . Hypertension   . Hyperthyroidism   . Paroxysmal ventricular  tachycardia (HCC)   . Type II diabetes mellitus (HCC)   . UTI (urinary tract infection)    being treated with Keflex  . Ventricular septal defect     Past Surgical History:  Procedure Laterality Date  . ABDOMINAL HYSTERECTOMY    . BACK SURGERY    . CARDIAC CATHETERIZATION N/A 06/23/2015   Procedure: Left Heart Cath and Coronary Angiography;  Surgeon: Laurey Morale, MD;  Location: [facility] INVASIVE CV LAB;  Service: Cardiovascular;  Laterality: N/A;  . CARDIAC CATHETERIZATION  1960   "VSD was so small; didn't need repaired"  . EXAM UNDER ANESTHESIA WITH MANIPULATION OF HIP Right 06/02/2014   dr Jerl Santos  . FRACTURE SURGERY    . HERNIA REPAIR    . HIP CLOSED REDUCTION Right 06/02/2014   Procedure: CLOSED MANIPULATION HIP;  Surgeon: Velna Ochs, MD;  Location: [facility] OR;  Service: Orthopedics;  Laterality: Right;  . JOINT REPLACEMENT    . JOINT REPLACEMENT    . REFRACTIVE SURGERY Bilateral   . SHOULDER ARTHROSCOPY Right   . SHOULDER OPEN ROTATOR CUFF REPAIR Right   . SPINAL FUSION  1996   "t10 down to my coccyx  . SPINE HARDWARE REMOVAL    . TOTAL ABDOMINAL HYSTERECTOMY    . TOTAL HIP ARTHROPLASTY Right 05/10/2014   [place]      by dr Carola Rhine  . TOTAL KNEE ARTHROPLASTY Left   .  TOTAL KNEE ARTHROPLASTY Left   . TOTAL SHOULDER ARTHROPLASTY Left 08/19/2016  . TOTAL SHOULDER ARTHROPLASTY Left 08/19/2016   Procedure: TOTAL SHOULDER ARTHROPLASTY;  Surgeon: Jones Broom, MD;  Location: [facility] OR;  Service: Orthopedics;  Laterality: Left;  Left total shoulder replacement    Family History  Problem Relation Age of Onset  . Stroke Father 83       deceased  . Other Mother        alive  . Heart attack Other        multiple uncles have died with myocardial infarction    Social history:  reports that she has been smoking cigarettes. She has a 25.00 pack-year smoking history. She has never used smokeless tobacco. She reports that she does not drink alcohol or use  drugs.  Medications:  Prior to Admission medications   Medication Sig Start Date End Date Taking? Authorizing Provider  AMLODIPINE BENZOATE PO Take 5 mg by mouth.   Yes [provider]  aspirin 81 MG tablet Take 81 mg by mouth daily.     Yes [provider]  buPROPion (WELLBUTRIN XL) 300 MG 24 hr tablet Take 300 mg by mouth daily.     Yes [provider]  carvedilol (COREG) 3.125 MG tablet Take 3.125 mg by mouth 2 (two) times daily. 06/02/16  Yes [provider]  Fexofenadine HCl (MUCINEX ALLERGY PO) Take by mouth.   Yes [provider]  fluvoxaMINE (LUVOX) 100 MG tablet Take 300 mg by mouth at bedtime.    Yes [provider]  lamoTRIgine (LAMICTAL) 25 MG tablet Take 25 mg by mouth 2 (two) times daily.   Yes [provider]  levothyroxine (SYNTHROID, LEVOTHROID) 88 MCG tablet Take 88 mcg by mouth daily before breakfast.   Yes [provider]  Loperamide HCl (IMODIUM A-D PO) Take by mouth.   Yes [provider]  losartan (COZAAR) 100 MG tablet Take 100 mg by mouth daily.   Yes [provider]  LYRICA 100 MG capsule Take 100 mg by mouth 2 (two) times daily. 05/21/15  Yes [provider]  metFORMIN (GLUCOPHAGE) 500 MG tablet Take 500 mg by mouth 2 (two) times daily with a meal.   Yes [provider]  Multiple Vitamin (MULTIVITAMIN) tablet Take 1 tablet by mouth daily.     Yes [provider]  omeprazole (PRILOSEC OTC) 20 MG tablet Take 20 mg by mouth every evening.    Yes [provider]  oxyCODONE-acetaminophen (PERCOCET) 7.5-325 MG tablet Take 1-2 tablets by mouth every 4 (four) hours as needed for moderate pain. 08/20/16  Yes Jiles Harold, PA-C  QUEtiapine (SEROQUEL) 300 MG tablet Take 300 mg by mouth at bedtime.    Yes [provider]  REXULTI 1 MG TABS TK 1 T PO QD 07/09/18  Yes [provider]  rosuvastatin (CRESTOR) 20 MG tablet Take 20 mg by  mouth at bedtime. 05/28/16  Yes [provider]      Allergies  Allergen Reactions  . No Known Allergies     ROS:  Out of a complete 14 system review of symptoms, the patient complains only of the following symptoms, and all other reviewed systems are negative.  Fatigue Chest pain Urination problems Easy bruising Feeling hot, increased thirst Joint pain, joint swelling, aching muscles Runny nose Memory loss, insomnia  Blood pressure (!) 177/90, pulse 81, height 5\' 1"  (1.549 m), weight 190 lb (86.2 kg).  Physical Exam  General: The patient  is alert and cooperative at the time of the examination.  The patient is moderately obese.  Eyes: Pupils are equal, round, and reactive to light. Discs are flat bilaterally.  Neck: The neck is supple, no carotid bruits are noted.  Respiratory: The respiratory examination is clear.  Cardiovascular: The cardiovascular examination reveals a regular rate and rhythm, no obvious murmurs or rubs are noted.  Skin: Extremities are without significant edema.  Neurologic Exam  Mental status: The patient is alert and oriented x 3 at the time of the examination. The patient has apparent normal recent and remote memory, with an apparently normal attention span and concentration ability.  Mini-Mental status examination done today shows a total score 29/30.  Cranial nerves: Facial symmetry is present. There is good sensation of the face to pinprick and soft touch bilaterally. The strength of the facial muscles and the muscles to head turning and shoulder shrug are normal bilaterally. Speech is well enunciated, no aphasia or dysarthria is noted. Extraocular movements are full. Visual fields are full. The tongue is midline, and the patient has symmetric elevation of the soft palate. No obvious hearing deficits are noted.  Motor: The motor testing reveals 5 over 5 strength of all 4 extremities. Good symmetric motor tone is noted  throughout.  Sensory: Sensory testing is intact to pinprick, soft touch, vibration sensation, and position sense on all 4 extremities. No evidence of extinction is noted.  Coordination: Cerebellar testing reveals good finger-nose-finger and heel-to-shin bilaterally.  Gait and station: Gait has a limping type gait on the right leg.  Gait is slightly wide-based.  Tandem gait was not attempted.  Romberg is negative.  Reflexes: Deep tendon reflexes are symmetric and normal bilaterally. Toes are downgoing bilaterally.   CT brain 02/27/18:  IMPRESSION: 1. No acute intracranial abnormality. 2. Minimal to mild chronic microvascular ischemic changes of the white matter, unchanged since the MRI 1 year ago.  * CT scan images were reviewed online. I agree with the written report.    Assessment/Plan:  1.  Mild cognitive impairment  The patient in the past has had history of episodic hyponatremia.  The patient does have a history of depression, and a prominent family history of Alzheimer's disease.  The patient may be at risk for Alzheimer's.  The patient has been on Aricept in the past and seemed to tolerate it, she does not recall why she stopped the drug.  She wishes to go back on Aricept, she is interested in entering into research for Alzheimer's disease, but I indicated that she may not be a candidate given the history of depression, and use of medication such as Seroquel and Lyrica.  Seroquel and Lyrica may impair her cognitive functioning.  The patient has undergone CT of the brain and MRI of the brain recently.  Blood work will be done today.  The patient will follow-up in 6 months.  Marlan Palau MD 09/06/2018 2:19 PM  [facility] [address] [place], [place] [address]  Phone [phone] Fax [phone]

## 2018-09-07 ENCOUNTER — Telehealth: Payer: Self-pay

## 2018-09-07 LAB — VITAMIN B12: VITAMIN B 12: 559 pg/mL (ref 232–1245)

## 2018-09-07 LAB — RPR: RPR Ser Ql: NONREACTIVE

## 2018-09-07 LAB — SEDIMENTATION RATE: Sed Rate: 26 mm/hr (ref 0–40)

## 2018-09-07 NOTE — Telephone Encounter (Signed)
-----   Message from York Spaniel, MD sent at 09/07/2018  7:02 AM EST -----  The blood work results are unremarkable. Please call the patient. ----- Message ----- From: [facility] Lab Results In Sent: 09/07/2018   5:39 AM EST To: York Spaniel, MD

## 2018-09-07 NOTE — Telephone Encounter (Signed)
I contacted the pt and left a vm advising of unremarkable labs. I advised the pt our office would be closing today at 12 pm and re open on Monday 09/11/18. Pt advised if she had any further questions to call me back.

## 2018-10-16 ENCOUNTER — Ambulatory Visit: Payer: Medicare Other

## 2018-12-03 ENCOUNTER — Other Ambulatory Visit: Payer: Self-pay | Admitting: Neurology

## 2018-12-25 ENCOUNTER — Ambulatory Visit: Payer: Medicare Other

## 2019-01-09 ENCOUNTER — Ambulatory Visit
Admission: RE | Admit: 2019-01-09 | Discharge: 2019-01-09 | Disposition: A | Discharge status: Home / Self Care | Payer: Medicare Other | Source: Ambulatory Visit | Attending: Family Medicine | Admitting: Family Medicine

## 2019-01-09 ENCOUNTER — Other Ambulatory Visit: Payer: Self-pay

## 2019-01-09 DIAGNOSIS — Z1231 Encounter for screening mammogram for malignant neoplasm of breast: Secondary | ICD-10-CM

## 2019-02-22 ENCOUNTER — Other Ambulatory Visit (HOSPITAL_COMMUNITY): Payer: Self-pay | Admitting: Family Medicine

## 2019-02-22 DIAGNOSIS — Q210 Ventricular septal defect: Secondary | ICD-10-CM

## 2019-03-07 ENCOUNTER — Telehealth (HOSPITAL_COMMUNITY): Payer: Self-pay | Admitting: Radiology

## 2019-03-07 ENCOUNTER — Ambulatory Visit: Payer: Medicare Other | Admitting: Neurology

## 2019-03-07 NOTE — Telephone Encounter (Signed)
Left message to reschedule echocardiogram from 105 to 1250 on Monday March 12, 2019.

## 2019-03-12 ENCOUNTER — Other Ambulatory Visit (HOSPITAL_COMMUNITY): Payer: Medicare Other

## 2019-03-13 ENCOUNTER — Other Ambulatory Visit: Payer: Self-pay | Admitting: Neurology

## 2019-03-26 NOTE — Progress Notes (Deleted)
PATIENT: [name] DOB: January 16, 1956  REASON FOR VISIT: follow up HISTORY FROM: patient  HISTORY OF PRESENT ILLNESS: Today 03/26/19  [name] is a 63 year old female with history of reported memory disturbance.  She has a strong family history for Alzheimer's disease.  Her last memory score was 29/30. She remains on Aricept 5 mg daily.   HISTORY 09/06/2018 Dr. Anne Hahn: [name] is a 63 year old right-handed white female with a history of some issues with memory dating back approximately 2 years.  The patient is quite concerned that she may be developing Alzheimer's disease as she has a very prominent family history for Alzheimer's, she claims that her maternal grandfather, mother and 2 maternal aunts had Alzheimer's disease.  The patient indicates that she has a brother and a sister who also have some troubles with memory.  The patient reports that she has difficulty with short-term and long-term memory, she has difficulty remembering names for people.  She has always been challenged with directions while driving, this has not changed any.  She has not been unsafe with driving.  She is able to keep up with her own medications and appointments, she is able to do her own finances fairly well.  The patient has been diagnosed with bipolar disorder, she mainly has depression.  She is on Seroquel for this, she has chronic pain issues as well and she does not sleep well at night, she takes Lyrica and is followed through a pain center with Dr. Jordan Likes.  The patient reports some gait problems associated with a leg length discrepancy, she has a much shorter right leg than the left.  She walks with a cane.  She feels weak all over, she does have some right foot numbness.  She denies any significant problems with headaches or dizziness.  She comes to this office for an evaluation.   REVIEW OF SYSTEMS: Out of a complete 14 system review of symptoms, the patient complains only of the following  symptoms, and all other reviewed systems are negative.  ALLERGIES: Allergies  Allergen Reactions  . No Known Allergies     HOME MEDICATIONS: Outpatient Medications Prior to Visit  Medication Sig Dispense Refill  . AMLODIPINE BENZOATE PO Take 5 mg by mouth.    [name] aspirin 81 MG tablet Take 81 mg by mouth daily.      [name] buPROPion (WELLBUTRIN XL) 300 MG 24 hr tablet Take 300 mg by mouth daily.      . carvedilol (COREG) 3.125 MG tablet Take 3.125 mg by mouth 2 (two) times daily.  1  . donepezil (ARICEPT) 5 MG tablet TAKE 1 TABLET(5 MG) BY MOUTH AT BEDTIME 90 tablet 1  . Fexofenadine HCl (MUCINEX ALLERGY PO) Take by mouth.    . fluvoxaMINE (LUVOX) 100 MG tablet Take 300 mg by mouth at bedtime.     . lamoTRIgine (LAMICTAL) 25 MG tablet Take 25 mg by mouth 2 (two) times daily.    [name] levothyroxine (SYNTHROID, LEVOTHROID) 88 MCG tablet Take 88 mcg by mouth daily before breakfast.    . Loperamide HCl (IMODIUM A-D PO) Take by mouth.    . losartan (COZAAR) 100 MG tablet Take 100 mg by mouth daily.    [name] LYRICA 100 MG capsule Take 100 mg by mouth 2 (two) times daily.  1  . metFORMIN (GLUCOPHAGE) 500 MG tablet Take 500 mg by mouth 2 (two) times daily with a meal.    . Multiple Vitamin (MULTIVITAMIN) tablet Take 1 tablet by  mouth daily.      [name]. omeprazole (PRILOSEC OTC) 20 MG tablet Take 20 mg by mouth every evening.     [name]. oxyCODONE-acetaminophen (PERCOCET) 7.5-325 MG tablet Take 1-2 tablets by mouth every 4 (four) hours as needed for moderate pain. 60 tablet 0  . QUEtiapine (SEROQUEL) 300 MG tablet Take 300 mg by mouth at bedtime.     [name]. REXULTI 1 MG TABS TK 1 T PO QD    . rosuvastatin (CRESTOR) 20 MG tablet Take 20 mg by mouth at bedtime.  11   No facility-administered medications prior to visit.     PAST MEDICAL HISTORY: Past Medical History:  Diagnosis Date  . Anemia   . Anxiety   . Arthritis   . Depression   . GERD (gastroesophageal reflux disease)   . Heart murmur    "related to VSD"  .  High cholesterol   . History of blood transfusion    "related to OR" (08/19/2016)  . History of hiatal hernia   . Hypertension   . Hyperthyroidism   . Mild cognitive impairment 09/06/2018  . Paroxysmal ventricular tachycardia (HCC)   . Type II diabetes mellitus (HCC)   . UTI (urinary tract infection)    being treated with Keflex  . Ventricular septal defect     PAST SURGICAL HISTORY: Past Surgical History:  Procedure Laterality Date  . ABDOMINAL HYSTERECTOMY    . BACK SURGERY    . CARDIAC CATHETERIZATION N/A 06/23/2015   Procedure: Left Heart Cath and Coronary Angiography;  Surgeon: Laurey Moralealton S McLean, MD;  [facility];  Service: Cardiovascular;  Laterality: N/A;  . CARDIAC CATHETERIZATION  1960   "VSD was so small; didn't need repaired"  . EXAM UNDER ANESTHESIA WITH MANIPULATION OF HIP Right 06/02/2014   dr Jerl Santosdalldorf  . FRACTURE SURGERY    . HERNIA REPAIR    . HIP CLOSED REDUCTION Right 06/02/2014   Procedure: CLOSED MANIPULATION HIP;  Surgeon: Velna OchsPeter G Dalldorf, MD;  Location: [facility] OR;  Service: Orthopedics;  Laterality: Right;  . JOINT REPLACEMENT    . JOINT REPLACEMENT    . REFRACTIVE SURGERY Bilateral   . SHOULDER ARTHROSCOPY Right   . SHOULDER OPEN ROTATOR CUFF REPAIR Right   . SPINAL FUSION  1996   "t10 down to my coccyx  . SPINE HARDWARE REMOVAL    . TOTAL ABDOMINAL HYSTERECTOMY    . TOTAL HIP ARTHROPLASTY Right 05/10/2014   [place]      by dr Carola Rhinechristopher olcott  . TOTAL KNEE ARTHROPLASTY Left   . TOTAL KNEE ARTHROPLASTY Left   . TOTAL SHOULDER ARTHROPLASTY Left 08/19/2016  . TOTAL SHOULDER ARTHROPLASTY Left 08/19/2016   Procedure: TOTAL SHOULDER ARTHROPLASTY;  Surgeon: Jones BroomJustin Chandler, MD;  Location: [facility] OR;  Service: Orthopedics;  Laterality: Left;  Left total shoulder replacement    FAMILY HISTORY: Family History  Problem Relation Age of Onset  . Stroke Father 242       deceased  . Dementia Father   . Other Mother        alive  . Heart attack  Other        multiple uncles have died with myocardial infarction  . Chorea Maternal Grandfather   . Dementia Maternal Aunt   . Dementia Maternal Aunt     SOCIAL HISTORY: Social History   Socioeconomic History  . Marital status: Married    Spouse name: Not on file  . Number of children: Not on file  . Years of  education: Not on file  . Highest education level: Master's degree (e.g., MA, MS, MEng, MEd, MSW, MBA)  Occupational History  . Occupation: Magazine features editorteacher    Employer: SELF-EMPLOYED    Comment: former  Engineer, productionocial Needs  . Financial resource strain: Not on file  . Food insecurity    Worry: Not on file    Inability: Not on file  . Transportation needs    Medical: Not on file    Non-medical: Not on file  Tobacco Use  . Smoking status: Current Every Day Smoker    Packs/day: 0.50    Years: 50.00    Pack years: 25.00    Types: Cigarettes  . Smokeless tobacco: Never Used  Substance and Sexual Activity  . Alcohol use: No  . Drug use: No  . Sexual activity: Not on file  Lifestyle  . Physical activity    Days per week: Not on file    Minutes per session: Not on file  . Stress: Not on file  Relationships  . Social Musicianconnections    Talks on phone: Not on file    Gets together: Not on file    Attends religious service: Not on file    Active member of club or organization: Not on file    Attends meetings of clubs or organizations: Not on file    Relationship status: Not on file  . Intimate partner violence    Fear of current or ex partner: Not on file    Emotionally abused: Not on file    Physically abused: Not on file    Forced sexual activity: Not on file  Other Topics Concern  . Not on file  Social History Narrative   Right handed   Caffeine 2-3 cups daily    Lives at home with husband       PHYSICAL EXAM  There were no vitals filed for this visit. There is no height or weight on file to calculate BMI.  Generalized: Well developed, in no acute distress    Neurological examination  Mentation: Alert oriented to time, place, history taking. Follows all commands speech and language fluent Cranial nerve II-XII: Pupils were equal round reactive to light. Extraocular movements were full, visual field were full on confrontational test. Facial sensation and strength were normal. Uvula tongue midline. Head turning and shoulder shrug  were normal and symmetric. Motor: The motor testing reveals 5 over 5 strength of all 4 extremities. Good symmetric motor tone is noted throughout.  Sensory: Sensory testing is intact to soft touch on all 4 extremities. No evidence of extinction is noted.  Coordination: Cerebellar testing reveals good finger-nose-finger and heel-to-shin bilaterally.  Gait and station: Gait is normal. Tandem gait is normal. Romberg is negative. No drift is seen.  Reflexes: Deep tendon reflexes are symmetric and normal bilaterally.   DIAGNOSTIC DATA (LABS, IMAGING, TESTING) - I reviewed patient records, labs, notes, testing and imaging myself where available.  Lab Results  Component Value Date   WBC 11.3 (H) 02/28/2018   HGB 13.2 02/28/2018   HCT 39.5 02/28/2018   MCV 95.4 02/28/2018   PLT 326 02/28/2018      Component Value Date/Time   NA 134 (L) 02/28/2018 1022   NA 142 11/04/2017 1425   K 3.9 02/28/2018 1022   CL 102 02/28/2018 1022   CO2 23 02/28/2018 1022   GLUCOSE 184 (H) 02/28/2018 1022   BUN 6 (L) 02/28/2018 1022   BUN 8 11/04/2017 1425   CREATININE 0.71 02/28/2018  1022   CREATININE 0.75 06/18/2015 1231   CALCIUM 8.6 (L) 02/28/2018 1022   PROT 6.7 02/28/2018 0150   PROT 6.8 11/04/2017 1425   ALBUMIN 3.8 02/28/2018 0150   ALBUMIN 4.5 11/04/2017 1425   AST 32 02/28/2018 0150   ALT 27 02/28/2018 0150   ALKPHOS 116 02/28/2018 0150   BILITOT 0.5 02/28/2018 0150   BILITOT <0.2 11/04/2017 1425   GFRNONAA >60 02/28/2018 1022   GFRAA >60 02/28/2018 1022   Lab Results  Component Value Date   CHOL 97 (L) 10/17/2017   HDL  47 10/17/2017   LDLCALC 27 10/17/2017   LDLDIRECT 73.0 12/25/2014   TRIG 115 10/17/2017   CHOLHDL 2.1 10/17/2017   Lab Results  Component Value Date   HGBA1C 6.5 (H) 08/17/2016   Lab Results  Component Value Date   [id number] 09/06/2018   Lab Results  Component Value Date   TSH 1.636 02/28/2018      ASSESSMENT AND PLAN 63 y.o. year old female  has a past medical history of Anemia, Anxiety, Arthritis, Depression, GERD (gastroesophageal reflux disease), Heart murmur, High cholesterol, History of blood transfusion, History of hiatal hernia, Hypertension, Hyperthyroidism, Mild cognitive impairment (09/06/2018), Paroxysmal ventricular tachycardia ([place]), Type II diabetes mellitus ([place]), UTI (urinary tract infection), and Ventricular septal defect. here with ***   I spent 15 minutes with the patient. 50% of this time was spent   [name], [place], DNP 03/26/2019, 1:04 PM [facility] [address] [place], [place] [address] [phone]

## 2019-03-27 ENCOUNTER — Ambulatory Visit: Payer: Medicare Other | Admitting: Neurology

## 2019-03-27 ENCOUNTER — Encounter: Payer: Self-pay | Admitting: Neurology

## 2019-03-27 ENCOUNTER — Telehealth: Payer: Self-pay

## 2019-03-27 NOTE — Telephone Encounter (Signed)
Patient was a no call/no show for their appointment today.   

## 2019-04-13 ENCOUNTER — Ambulatory Visit (HOSPITAL_COMMUNITY): Payer: Medicare Other | Attending: Cardiology

## 2019-04-13 ENCOUNTER — Other Ambulatory Visit: Payer: Self-pay

## 2019-04-13 DIAGNOSIS — Q210 Ventricular septal defect: Secondary | ICD-10-CM

## 2019-04-30 DIAGNOSIS — M6281 Muscle weakness (generalized): Secondary | ICD-10-CM | POA: Insufficient documentation

## 2019-05-09 NOTE — Progress Notes (Addendum)
CARDIOLOGY OFFICE NOTE  Date:  05/15/2019    [name] Date of Birth: [birth year] Medical Record #[id number]  PCP:  Elias Elseeade, Robert, MD  Cardiologist:  [facility]  Chief Complaint  Patient presents with  . Follow-up    History of Present Illness: Marissa Collegelizabeth G Delancy is a 63 y.o. female who presents today for a follow up visit.  Seen for Dr. Shirlee LatchMcLean. Primarily follows with me. Dr. Excell Seltzerooper has agreed to co-manage.   She has a history of membranous VSD (small), cardiomyopathy (probably due to HTN, EF improved on most recent echo), and prior VT s/p ablation. In 2/15, she had a Lexiscan Cardiolite showing EF 61% and a fixed small apical defect with no ischemia. Repeat echo over the past several years have showed EF 45-50% with small stable perimembranous VSD and and normal RV. Other issues include HTN, HLD, DM, PSVT, GERD and anemia.   Last seenby Dr. Shirlee LatchMcLean backin Marchof 2016- had had a prior fall with right femur fracture in 11/15. This was surgically repaired. She was still smoking. I have followed her since.   I saw her in November of 2016 - having more chest pain - lots of stress - referred on for cardiac cath - see below - mild nonobstructive disease.Last visit with me was in  April of 2019. She was continuing to smoke. Having more issues with low sodium levels. Was not compliant with fluid restrictions. Multiple orthopedic issues. Echo updated after her last visit - there was concern for mild to moderate AI along with some dilatation of the aorta -reviewed with Dr. Excell Seltzerooper.   Hospitalized in August of 2019 with dizziness. No frank syncope noted at that time.    She had a follow up echo last month.   The patient does not have symptoms concerning for COVID-19 infection (fever, chills, cough, or new shortness of breath).   Comes in today. Here alone. [place] due to [place]. She feels good. Her breathing is ok. She  has lots of orthopedic issues - she is actually going to start some PT. She is not short of breath. No swelling. She is trying to smoke less. Remains in recovery for her alcohol issues. She has lots of questions about the most recent echo - no real AI was noted - EF back to 45 to 50% and the aorta looks ok - VSD remains unchanged.    Past Medical History:  Diagnosis Date  . Anemia   . Anxiety   . Arthritis   . Depression   . GERD (gastroesophageal reflux disease)   . Heart murmur    "related to VSD"  . High cholesterol   . History of blood transfusion    "related to OR" (08/19/2016)  . History of hiatal hernia   . Hypertension   . Hyperthyroidism   . Mild cognitive impairment 09/06/2018  . Paroxysmal ventricular tachycardia (HCC)   . Type II diabetes mellitus (HCC)   . UTI (urinary tract infection)    being treated with Keflex  . Ventricular septal defect     Past Surgical History:  Procedure Laterality Date  . ABDOMINAL HYSTERECTOMY    . BACK SURGERY    . CARDIAC CATHETERIZATION N/A 06/23/2015   Procedure: Left Heart Cath and Coronary Angiography;  Surgeon: Laurey Moralealton S McLean, MD;  [facility];  Service: Cardiovascular;  Laterality: N/A;  . CARDIAC CATHETERIZATION  1960   "VSD was so  small; didn't need repaired"  . EXAM UNDER ANESTHESIA WITH MANIPULATION OF HIP Right 06/02/2014   dr Jerl Santos  . FRACTURE SURGERY    . HERNIA REPAIR    . HIP CLOSED REDUCTION Right 06/02/2014   Procedure: CLOSED MANIPULATION HIP;  Surgeon: Velna Ochs, MD;  Location: [facility] OR;  Service: Orthopedics;  Laterality: Right;  . JOINT REPLACEMENT    . JOINT REPLACEMENT    . REFRACTIVE SURGERY Bilateral   . SHOULDER ARTHROSCOPY Right   . SHOULDER OPEN ROTATOR CUFF REPAIR Right   . SPINAL FUSION  1996   "t10 down to my coccyx  . SPINE HARDWARE REMOVAL    . TOTAL ABDOMINAL HYSTERECTOMY    . TOTAL HIP ARTHROPLASTY Right 05/10/2014   [place]      by dr Carola Rhine  . TOTAL  KNEE ARTHROPLASTY Left   . TOTAL KNEE ARTHROPLASTY Left   . TOTAL SHOULDER ARTHROPLASTY Left 08/19/2016  . TOTAL SHOULDER ARTHROPLASTY Left 08/19/2016   Procedure: TOTAL SHOULDER ARTHROPLASTY;  Surgeon: Jones Broom, MD;  Location: [facility] OR;  Service: Orthopedics;  Laterality: Left;  Left total shoulder replacement     Medications: Current Meds  Medication Sig  . AMLODIPINE BENZOATE PO Take 5 mg by mouth.  [name] aspirin 81 MG tablet Take 81 mg by mouth daily.    [name] buPROPion (WELLBUTRIN XL) 300 MG 24 hr tablet Take 300 mg by mouth daily.    [name] donepezil (ARICEPT) 5 MG tablet TAKE 1 TABLET(5 MG) BY MOUTH AT BEDTIME  . Fexofenadine HCl (MUCINEX ALLERGY PO) Take by mouth as needed.   . fluvoxaMINE (LUVOX) 100 MG tablet Take 300 mg by mouth at bedtime.   . lamoTRIgine (LAMICTAL) 25 MG tablet Take 25 mg by mouth 2 (two) times daily.  [name] levothyroxine (SYNTHROID, LEVOTHROID) 88 MCG tablet Take 88 mcg by mouth daily before breakfast.  . Loperamide HCl (IMODIUM A-D PO) Take by mouth as needed.   [name] losartan (COZAAR) 100 MG tablet Take 100 mg by mouth daily.  [name] LYRICA 100 MG capsule Take 100 mg by mouth 2 (two) times daily.  . metFORMIN (GLUCOPHAGE) 500 MG tablet Take 500 mg by mouth 2 (two) times daily with a meal.  . Multiple Vitamin (MULTIVITAMIN) tablet Take 1 tablet by mouth daily.    [name] omeprazole (PRILOSEC OTC) 20 MG tablet Take 20 mg by mouth every evening.   [name] omeprazole (PRILOSEC) 20 MG capsule Take 20 mg by mouth every morning.  [name] QUEtiapine (SEROQUEL) 300 MG tablet Take 300 mg by mouth 2 (two) times daily.   . rosuvastatin (CRESTOR) 20 MG tablet Take 20 mg by mouth at bedtime.  . [DISCONTINUED] carvedilol (COREG) 3.125 MG tablet Take 3.125 mg by mouth 2 (two) times daily.     Allergies: Allergies  Allergen Reactions  . No Known Allergies     Social History: The patient  reports that she has been smoking cigarettes. She has a 25.00 pack-year smoking history. She has never used smokeless  tobacco. She reports that she does not drink alcohol or use drugs.   Family History: The patient's family history includes Chorea in her maternal grandfather; Dementia in her father, maternal aunt, and maternal aunt; Heart attack in an other family member; Other in her mother; Stroke (age of onset: 50) in her father.   Review of Systems: Please see the history of present illness.   All other systems are reviewed and negative.   Physical Exam: VS:  BP 118/90  Pulse 95   Ht 5\' 1"  (1.549 m)   Wt 180 lb 12.8 oz (82 kg)   SpO2 99%   BMI 34.16 kg/m  .  BMI Body mass index is 34.16 kg/m.  Wt Readings from Last 3 Encounters:  05/15/19 180 lb 12.8 oz (82 kg)  09/06/18 190 lb (86.2 kg)  02/27/18 169 lb (76.7 kg)    General: Pleasant. She looks good. She walks with a limp. Using a cane. She is alert and in no acute distress.  She is down from 186# at my last visit.  HEENT: Normal.  Neck: Supple, no JVD, carotid bruits, or masses noted.  Cardiac: Regular rate and rhythm. No real murmur that I appreciate.  No edema.  Respiratory:  Lungs are clear to auscultation bilaterally with normal work of breathing.  GI: Soft and nontender.  MS: No deformity or atrophy. Gait and ROM intact.  Skin: Warm and dry. Color is normal.  Neuro:  Strength and sensation are intact and no gross focal deficits noted.  Psych: Alert, appropriate and with normal affect.   LABORATORY DATA:  EKG:  EKG is ordered today. This demonstrates NSR - septal Q's with LAFB - unchanged.  Lab Results  Component Value Date   WBC 11.3 (H) 02/28/2018   HGB 13.2 02/28/2018   HCT 39.5 02/28/2018   PLT 326 02/28/2018   GLUCOSE 184 (H) 02/28/2018   CHOL 97 (L) 10/17/2017   TRIG 115 10/17/2017   HDL 47 10/17/2017   LDLDIRECT 73.0 12/25/2014   LDLCALC 27 10/17/2017   ALT 27 02/28/2018   AST 32 02/28/2018   NA 134 (L) 02/28/2018   K 3.9 02/28/2018   CL 102 02/28/2018   CREATININE 0.71 02/28/2018   BUN 6 (L) 02/28/2018    CO2 23 02/28/2018   TSH 1.636 02/28/2018   INR 0.93 08/17/2016   HGBA1C 6.5 (H) 08/17/2016       BNP (last 3 results) No results for input(s): BNP in the last 8760 hours.  ProBNP (last 3 results) No results for input(s): PROBNP in the last 8760 hours.   Other Studies Reviewed Today:  ECHO IMPRESSIONS 03/2019   1. Left ventricular ejection fraction, by visual estimation, is 45 to 50%. The left ventricle has mildly decreased function. Mildly increased left ventricular size. There is no left ventricular hypertrophy.  2. Elevated mean left atrial pressure.  3. Left ventricular diastolic Doppler parameters are consistent with impaired relaxation pattern of LV diastolic filling.  4. Global right ventricle has normal systolic function.The right ventricular size is normal.  5. Left atrial size was normal.  6. Right atrial size was normal.  7. Mild mitral annular calcification.  8. The mitral valve is normal in structure. Trace mitral valve regurgitation. No evidence of mitral stenosis.  9. The tricuspid valve is normal in structure. Tricuspid valve regurgitation is mild. 10. The aortic valve is tricuspid Aortic valve regurgitation is trivial by color flow Doppler. Mild aortic valve sclerosis without stenosis. 11. The pulmonic valve was normal in structure. Pulmonic valve regurgitation is not visualized by color flow Doppler. 12. Normal pulmonary artery systolic pressure. 13. The inferior vena cava is normal in size with greater than 50% respiratory variability, suggesting right atrial pressure of 3 mmHg. 14. Septal dyssynergy with overall mildly reduced LV systolic function; grade 1 diastolic dysfunction; mild LVE; trace AI; mild TR; small perimembranous VSD. 15. Small membranous ventricular septal defect with.    Echo Study Conclusions 11/2017  - Left ventricle:  The cavity size was normal. There was mild   concentric hypertrophy. Systolic function was normal. The   estimated  ejection fraction was in the range of 50% to 55%. Wall   motion was normal; there were no regional wall motion   abnormalities. There was an increased relative contribution of   atrial contraction to ventricular filling. Doppler parameters are   consistent with abnormal left ventricular relaxation (grade 1   diastolic dysfunction). - Ventricular septum: There was a defect in the perimembranous   region consistent with a small VSD.[name] There was a small left to   right shunt through a ventricular septal defect. Peak gradient   across VSD (S): 5 mm Hg. - Aortic valve: Severe focal calcification involving the right   coronary cusp. Right coronary cusp immobility was noted. There   was mild to moderate regurgitation. Regurgitation pressure   half-time: 446 ms. - Aorta: Ascending aorta diameter: 38 mm (ED). - Ascending aorta: The ascending aorta was mildly dilated. - Mitral valve: Calcified annulus. There was trivial regurgitation. - Tricuspid valve: There was trivial regurgitation.    Echo Study Conclusions 2018  - Left ventricle: The cavity size was normal. Wall thickness was   normal. Systolic function was mildly reduced. The estimated   ejection fraction was in the range of 45% to 50%. Diffuse   hypokinesis. Doppler parameters are consistent with abnormal left   ventricular relaxation (grade 1 diastolic dysfunction). - Ventricular septum: Small peri-membranous VSD noted with left to   right flow. - Aortic valve: Trileaflet; calcified right coronary cusp. There   was no stenosis. There was trivial regurgitation. - Aorta: Borderline dilated aortic root and ascending aorta. Aortic   root dimension: 38 mm (ED). Ascending aortic diameter: 39 mm (S). - Mitral valve: There was trivial regurgitation. - Right ventricle: The cavity size was normal. Systolic function   was normal. - Tricuspid valve: Peak RV-RA gradient (S): 23 mm Hg. - Pulmonary arteries: PA peak pressure: 26 mm Hg (S). -  Inferior vena cava: The vessel was normal in size. The   respirophasic diameter changes were in the normal range (>= 50%),   consistent with normal central venous pressure.  Impressions:  - Normal LV size with EF 45-50%, diffuse hypokinesis. Normal RV   size and systolic function. There was a small peri-membranous VSD   with left to right flow, this appears restrictive. Trivial aortic   insufficiency.    Cardiac CathConclusionfrom 06/2015  Mild, nonobstructive CAD. Suspect non-cardiac chest pain     Assessment/Plan:  1. VSD - perimembranous - remains small - most recent echo noted.   2. CAD - non obstructive by cath from 2016 - CV risk factor modification remains challenging - she has no active symptoms.   3. NICM - EF seems to basically be unchanged - back to prior echo's - she is agreeable to try and increase her Coreg a bit - will try 3.125 mg in the AM and 6.25 mg in the pm - she has had worsening fatigue with higher doses in the past.   4. History of hyponatremia - her labs are checked by PCP.   5. Tobacco abuse - smoking less - total cessation encouraged   6. HLD - on statin  7. HTN - BP is ok   8. H/o VT: s/p ablation. No symptomatic recurrence.   9. Bipolar disorder - followed by psyche - not discussed  10. Prior AI noted on last year's echo - not appreciated on  most recent study. Most recent study seems to be in line with prior studies - would follow for now.   79. COVID-19 Education: The signs and symptoms of COVID-19 were discussed with the patient and how to seek care for testing (follow up with PCP or arrange E-visit).  The importance of social distancing, staying at home, hand hygiene and wearing a mask when out in public were discussed today.  Current medicines are reviewed with the patient today.  The patient does not have concerns regarding medicines other than what has been noted above.  The following changes have been made:  See above.   Labs/ tests ordered today include:   No orders of the defined types were placed in this encounter.    Disposition:   FU with me in 6 months.   Patient is agreeable to this plan and will call if any problems develop in the interim.   SignedTruitt Merle, NP  05/15/2019 [place] [address] [place],   [address] Phone: [phone] Fax: [phone]       Addendum: 05/23/19  Sherren Mocha, MD sent to Burtis Junes, NP        Bary Castilla I reviewed her images and compared to prior studies. She definitely has a cardiomyopathy with mildly reduced LV function. I don't see much change between studies dating back to 2018. Valvular disease looks mild and VSD unchanged. I would push meds for cardiomyopathy if she will tolerate. Other question is whether cardiac MRI would be helpful to better quantify her LVEF? I'm not sure it would change management just a thought.    I have called Ms. Barnet Glasgow and reviewed this Dr. Antionette Char findings.  We will continue with our current plan of care.   Burtis Junes, RN, [place] [address] [place],   [address] [phone]

## 2019-05-15 ENCOUNTER — Encounter: Payer: Self-pay | Admitting: Nurse Practitioner

## 2019-05-15 ENCOUNTER — Other Ambulatory Visit: Payer: Self-pay

## 2019-05-15 ENCOUNTER — Ambulatory Visit (INDEPENDENT_AMBULATORY_CARE_PROVIDER_SITE_OTHER): Payer: Medicare Other | Admitting: Nurse Practitioner

## 2019-05-15 VITALS — BP 118/90 | HR 95 | Ht 61.0 in | Wt 180.8 lb

## 2019-05-15 DIAGNOSIS — Z7189 Other specified counseling: Secondary | ICD-10-CM

## 2019-05-15 DIAGNOSIS — I259 Chronic ischemic heart disease, unspecified: Secondary | ICD-10-CM | POA: Diagnosis not present

## 2019-05-15 DIAGNOSIS — Q210 Ventricular septal defect: Secondary | ICD-10-CM | POA: Diagnosis not present

## 2019-05-15 DIAGNOSIS — E871 Hypo-osmolality and hyponatremia: Secondary | ICD-10-CM | POA: Diagnosis not present

## 2019-05-15 DIAGNOSIS — E7800 Pure hypercholesterolemia, unspecified: Secondary | ICD-10-CM

## 2019-05-15 DIAGNOSIS — I10 Essential (primary) hypertension: Secondary | ICD-10-CM | POA: Diagnosis not present

## 2019-05-15 DIAGNOSIS — I428 Other cardiomyopathies: Secondary | ICD-10-CM

## 2019-05-15 MED ORDER — CARVEDILOL 3.125 MG PO TABS: 3.1250 mg | ORAL_TABLET | Freq: Two times a day (BID) | ORAL | 3 refills | Status: DC

## 2019-05-15 NOTE — Patient Instructions (Signed)
After Visit Summary:  We will be checking the following labs today - NONE   Medication Instructions:    Continue with your current medicines. BUT  We are going to try and increase your Coreg to 1 pill in the am and 2 pills in the PM - this can help with the pumping function of your heart   If you need a refill on your cardiac medications before your next appointment, please call your pharmacy.     Testing/Procedures To Be Arranged:  I will get your most recent echo relooked at.   Follow-Up:   See me in 6 months    At [facility], you and your health needs are our priority.  As part of our continuing mission to provide you with exceptional heart care, we have created designated Provider Care Teams.  These Care Teams include your primary Cardiologist (physician) and Advanced Practice Providers (APPs -  Physician Assistants and Nurse Practitioners) who all work together to provide you with the care you need, when you need it.  Special Instructions:  . Stay safe, stay home, wash your hands for at least 20 seconds and wear a mask when out in public.  . It was good to talk with you today.    Call the [place] office at [phone] if you have any questions, problems or concerns.

## 2019-05-16 ENCOUNTER — Other Ambulatory Visit: Payer: Self-pay | Admitting: Nurse Practitioner

## 2019-05-16 MED ORDER — CARVEDILOL 3.125 MG PO TABS: ORAL_TABLET | ORAL | 3 refills | Status: DC

## 2019-05-18 NOTE — Addendum Note (Signed)
Addended by: Jeremy Johann on: 05/18/2019 08:30 AM   Modules accepted: Orders

## 2019-08-09 ENCOUNTER — Encounter: Payer: Self-pay | Admitting: *Deleted

## 2019-10-17 NOTE — Progress Notes (Deleted)
CARDIOLOGY OFFICE NOTE  Date:  10/17/2019    [name] Date of Birth: 12/17/1955 Medical Record #[id number]  PCP:  Elias Else, MD  Cardiologist:  Kirt Boys    No chief complaint on file.   History of Present Illness: [name] is a 64 y.o. female who presents today for a 6 month check. Seen for Dr. Shirlee Latch. Primarily follows with me. Dr. Excell Seltzer has agreed to co-manage.   She has a history of membranous VSD (small), cardiomyopathy (probably due to HTN, EF improved on most recent echo), and prior VT s/p ablation. In 2/15, she had a Lexiscan Cardiolite showing EF 61% and a fixed small apical defect with no ischemia. Repeat echo over the past several years have showed EF 45-50% with small stable perimembranous VSD and and normal RV. Other issues include HTN, HLD, DM, PSVT, GERD and anemia.   Last seenby Dr. Shirlee Latch backin Marchof 2016- had had a prior fall with right femur fracture in 11/15. This was surgically repaired. She was still smoking. I have followed her since.   I saw her in November of 2016 - having more chest pain - lots of stress - referred on for cardiac cath - see below - mild nonobstructive disease.Since she has had issues with hyponatremia, lots of orthopedic issues. Echo has been updated with concern for mild to moderate AI along with dilation of the aorta.   Hospitalized in August of 2019 with dizziness. No frank syncope noted at that time.    I last saw her in October - [place]. Lots of orthopedic issues and she was getting ready to start PT. Trying to smoke less. Remains in recovery for her alcohol use. Echo had been updated - no real AI noted - EF back to 45 to 50% and aorta looked ok - VSD remained unchanged. Reviewed with Dr. Excell Seltzer.   The patient {does/does not:200015} have symptoms concerning for COVID-19 infection (fever, chills, cough, or new shortness of breath).    Comes in today. Here with   Past Medical History:  Diagnosis Date  . Anemia   . Anxiety   . Arthritis   . Depression   . GERD (gastroesophageal reflux disease)   . Heart murmur    "related to VSD"  . High cholesterol   . History of blood transfusion    "related to OR" (08/19/2016)  . History of hiatal hernia   . Hypertension   . Hyperthyroidism   . Mild cognitive impairment 09/06/2018  . Paroxysmal ventricular tachycardia (HCC)   . Type II diabetes mellitus (HCC)   . UTI (urinary tract infection)    being treated with Keflex  . Ventricular septal defect     Past Surgical History:  Procedure Laterality Date  . ABDOMINAL HYSTERECTOMY    . BACK SURGERY    . CARDIAC CATHETERIZATION N/A 06/23/2015   Procedure: Left Heart Cath and Coronary Angiography;  Surgeon: Laurey Morale, MD;  Location: [facility] INVASIVE CV LAB;  Service: Cardiovascular;  Laterality: N/A;  . CARDIAC CATHETERIZATION  1960   "VSD was so small; didn't need repaired"  . EXAM UNDER ANESTHESIA WITH MANIPULATION OF HIP Right 06/02/2014   dr Jerl Santos  . FRACTURE SURGERY    . HERNIA REPAIR    . HIP CLOSED REDUCTION Right 06/02/2014   Procedure: CLOSED MANIPULATION HIP;  Surgeon: Velna Ochs, MD;  Location: [facility] OR;  Service: Orthopedics;  Laterality: Right;  .  JOINT REPLACEMENT    . JOINT REPLACEMENT    . REFRACTIVE SURGERY Bilateral   . SHOULDER ARTHROSCOPY Right   . SHOULDER OPEN ROTATOR CUFF REPAIR Right   . SPINAL FUSION  1996   "t10 down to my coccyx  . SPINE HARDWARE REMOVAL    . TOTAL ABDOMINAL HYSTERECTOMY    . TOTAL HIP ARTHROPLASTY Right 05/10/2014   [place]      by dr Carola Rhine  . TOTAL KNEE ARTHROPLASTY Left   . TOTAL KNEE ARTHROPLASTY Left   . TOTAL SHOULDER ARTHROPLASTY Left 08/19/2016  . TOTAL SHOULDER ARTHROPLASTY Left 08/19/2016   Procedure: TOTAL SHOULDER ARTHROPLASTY;  Surgeon: Jones Broom, MD;  Location: [facility] OR;  Service: Orthopedics;  Laterality: Left;  Left total shoulder  replacement     Medications: No outpatient medications have been marked as taking for the 10/24/19 encounter (Appointment) with Rosalio Macadamia, NP.     Allergies: Allergies  Allergen Reactions  . No Known Allergies     Social History: The patient  reports that she has been smoking cigarettes. She has a 25.00 pack-year smoking history. She has never used smokeless tobacco. She reports that she does not drink alcohol or use drugs.   Family History: The patient's ***family history includes Chorea in her maternal grandfather; Dementia in her father, maternal aunt, and maternal aunt; Heart attack in an other family member; Other in her mother; Stroke (age of onset: 34) in her father.   Review of Systems: Please see the history of present illness.   All other systems are reviewed and negative.   Physical Exam: VS:  There were no vitals taken for this visit. [name]  BMI There is no height or weight on file to calculate BMI.  Wt Readings from Last 3 Encounters:  05/15/19 180 lb 12.8 oz (82 kg)  09/06/18 190 lb (86.2 kg)  02/27/18 169 lb (76.7 kg)    General: Pleasant. Well developed, well nourished and in no acute distress.   HEENT: Normal.  Neck: Supple, no JVD, carotid bruits, or masses noted.  Cardiac: ***Regular rate and rhythm. No murmurs, rubs, or gallops. No edema.  Respiratory:  Lungs are clear to auscultation bilaterally with normal work of breathing.  GI: Soft and nontender.  MS: No deformity or atrophy. Gait and ROM intact.  Skin: Warm and dry. Color is normal.  Neuro:  Strength and sensation are intact and no gross focal deficits noted.  Psych: Alert, appropriate and with normal affect.   LABORATORY DATA:  EKG:  EKG {ACTION; IS/IS STM:[id number]} ordered today.  Personally reviewed by me. This demonstrates ***.  Lab Results  Component Value Date   WBC 11.3 (H) 02/28/2018   HGB 13.2 02/28/2018   HCT 39.5 02/28/2018   PLT 326 02/28/2018   GLUCOSE 184 (H) 02/28/2018     CHOL 97 (L) 10/17/2017   TRIG 115 10/17/2017   HDL 47 10/17/2017   LDLDIRECT 73.0 12/25/2014   LDLCALC 27 10/17/2017   ALT 27 02/28/2018   AST 32 02/28/2018   NA 134 (L) 02/28/2018   K 3.9 02/28/2018   CL 102 02/28/2018   CREATININE 0.71 02/28/2018   BUN 6 (L) 02/28/2018   CO2 23 02/28/2018   TSH 1.636 02/28/2018   INR 0.93 08/17/2016   HGBA1C 6.5 (H) 08/17/2016       BNP (last 3 results) No results for input(s): BNP in the last 8760 hours.  ProBNP (last 3 results) No results for input(s): PROBNP in the  last 8760 hours.   Other Studies Reviewed Today:  ECHO IMPRESSIONS 03/2019  1. Left ventricular ejection fraction, by visual estimation, is 45 to 50%. The left ventricle has mildly decreased function. Mildly increased left ventricular size. There is no left ventricular hypertrophy. 2. Elevated mean left atrial pressure. 3. Left ventricular diastolic Doppler parameters are consistent with impaired relaxation pattern of LV diastolic filling. 4. Global right ventricle has normal systolic function.The right ventricular size is normal. 5. Left atrial size was normal. 6. Right atrial size was normal. 7. Mild mitral annular calcification. 8. The mitral valve is normal in structure. Trace mitral valve regurgitation. No evidence of mitral stenosis. 9. The tricuspid valve is normal in structure. Tricuspid valve regurgitation is mild. 10. The aortic valve is tricuspid Aortic valve regurgitation is trivial by color flow Doppler. Mild aortic valve sclerosis without stenosis. 11. The pulmonic valve was normal in structure. Pulmonic valve regurgitation is not visualized by color flow Doppler. 12. Normal pulmonary artery systolic pressure. 13. The inferior vena cava is normal in size with greater than 50% respiratory variability, suggesting right atrial pressure of 3 mmHg. 14. Septal dyssynergy with overall mildly reduced LV systolic function; grade 1 diastolic dysfunction;  mild LVE; trace AI; mild TR; small perimembranous VSD. 15. Small membranous ventricular septal defect with.    Echo Study Conclusions 11/2017  - Left ventricle: The cavity size was normal. There was mild concentric hypertrophy. Systolic function was normal. The estimated ejection fraction was in the range of 50% to 55%. Wall motion was normal; there were no regional wall motion abnormalities. There was an increased relative contribution of atrial contraction to ventricular filling. Doppler parameters are consistent with abnormal left ventricular relaxation (grade 1 diastolic dysfunction). - Ventricular septum: There was a defect in the perimembranous region consistent with a small VSD.[name] There was a small left to right shunt through a ventricular septal defect. Peak gradient across VSD (S): 5 mm Hg. - Aortic valve: Severe focal calcification involving the right coronary cusp. Right coronary cusp immobility was noted. There was mild to moderate regurgitation. Regurgitation pressure half-time: 446 ms. - Aorta: Ascending aorta diameter: 38 mm (ED). - Ascending aorta: The ascending aorta was mildly dilated. - Mitral valve: Calcified annulus. There was trivial regurgitation. - Tricuspid valve: There was trivial regurgitation.   Cardiac CathConclusionfrom 06/2015  Mild, nonobstructive CAD. Suspect non-cardiac chest pain     Assessment/Plan:  1. VSD - perimembranous - remains small - most recent echo noted.   Mamie Nick I reviewed her images and compared to prior studies. She definitely has a cardiomyopathy with mildly reduced LV function. I don't see much change between studies dating back to 2018. Valvular disease looks mild and VSD unchanged. I would push meds for cardiomyopathy if she will tolerate. Other question is whether cardiac MRI would be helpful to better quantify her LVEF? I'm not sure it would change management just a thought.     I have called Ms. Baron Hamper and reviewed this Dr. Earmon Phoenix findings.  We will continue with our current plan of care.     2. CAD - non obstructive by cath from 2016 - CV risk factor modification remains challenging - she has no active symptoms.   3. NICM - EF seems to basically be unchanged - back to prior echo's - she is agreeable to try and increase her Coreg a bit - will try 3.125 mg in the AM and 6.25 mg in the pm - she has had worsening fatigue  with higher doses in the past.   4. History of hyponatremia - her labs are checked by PCP.   5. Tobacco abuse - smoking less - total cessation encouraged   6. HLD - on statin  7. HTN - BP is ok   8. H/o VT: s/p ablation. No symptomatic recurrence.   9. Bipolar disorder - followed by psyche - not discussed  10. Prior AI noted on last year's echo - not appreciated on most recent study. Most recent study seems to be in line with prior studies - would follow for now.   [name] COVID-19 Education: The signs and symptoms of COVID-19 were discussed with the patient and how to seek care for testing (follow up with PCP or arrange E-visit).  The importance of social distancing, staying at home, hand hygiene and wearing a mask when out in public were discussed today.  Current medicines are reviewed with the patient today.  The patient does not have concerns regarding medicines other than what has been noted above.  The following changes have been made:  See above.  Labs/ tests ordered today include:   No orders of the defined types were placed in this encounter.    Disposition:   FU with *** in {gen number 9-81:191478} {Days to years:10300}.   Patient is agreeable to this plan and will call if any problems develop in the interim.   SignedTruitt Merle, NP  10/17/2019 9:03 AM  [place] [address] [place], [place]  [address] Phone: [phone] Fax: [phone]

## 2019-10-24 ENCOUNTER — Ambulatory Visit: Payer: Medicare Other | Admitting: Nurse Practitioner

## 2019-11-07 ENCOUNTER — Ambulatory Visit: Payer: Medicare Other | Admitting: Nurse Practitioner

## 2019-11-28 NOTE — Progress Notes (Deleted)
CARDIOLOGY OFFICE NOTE  Date:  11/28/2019    [name] Date of Birth: 1956-02-09 Medical Record #[id number]  PCP:  Elias Else, MD  Cardiologist:  Kirt Boys    No chief complaint on file.   History of Present Illness: [name] is a 64 y.o. female who presents today for a follow up visit. Seen for Dr. Shirlee Latch. Primarily follows with me. Dr. Excell Seltzer has agreed to co-manage.   She has a history of membranous VSD (small), cardiomyopathy (probably due to HTN, with improvement in EF), and prior VT s/p ablation.  Repeat echo over the past several years have showed EF 45-50% with small stable perimembranous VSD and and normal RV. Other issues include HTN, HLD, DM, PSVT, GERD and anemia.   Last seenby Dr. Shirlee Latch backin Marchof 2016- had had a prior fall with right femur fracture in 11/15. This was surgically repaired.  I have followed her since.  She was cathed in 2016 for chest pain - non obstructive disease noted. Echos have been updated periodically. She has had issues with sodium levels.   She had a follow up echo in September - then seen here by me in last October - she was doing well - lots of orthopedic issues. Trying to smoke less. Remains in recovery for her alcoholism. [place]   The patient {does/does not:200015} have symptoms concerning for COVID-19 infection (fever, chills, cough, or new shortness of breath).   Comes in today. Here with   Past Medical History:  Diagnosis Date  . Anemia   . Anxiety   . Arthritis   . Depression   . GERD (gastroesophageal reflux disease)   . Heart murmur    "related to VSD"  . High cholesterol   . History of blood transfusion    "related to OR" (08/19/2016)  . History of hiatal hernia   . Hypertension   . Hyperthyroidism   . Mild cognitive impairment 09/06/2018  . Paroxysmal ventricular tachycardia (HCC)   . Type II diabetes mellitus (HCC)   . UTI  (urinary tract infection)    being treated with Keflex  . Ventricular septal defect     Past Surgical History:  Procedure Laterality Date  . ABDOMINAL HYSTERECTOMY    . BACK SURGERY    . CARDIAC CATHETERIZATION N/A 06/23/2015   Procedure: Left Heart Cath and Coronary Angiography;  Surgeon: Laurey Morale, MD;  Location: [facility] INVASIVE CV LAB;  Service: Cardiovascular;  Laterality: N/A;  . CARDIAC CATHETERIZATION  1960   "VSD was so small; didn't need repaired"  . EXAM UNDER ANESTHESIA WITH MANIPULATION OF HIP Right 06/02/2014   dr Jerl Santos  . FRACTURE SURGERY    . HERNIA REPAIR    . HIP CLOSED REDUCTION Right 06/02/2014   Procedure: CLOSED MANIPULATION HIP;  Surgeon: Velna Ochs, MD;  Location: [facility] OR;  Service: Orthopedics;  Laterality: Right;  . JOINT REPLACEMENT    . JOINT REPLACEMENT    . REFRACTIVE SURGERY Bilateral   . SHOULDER ARTHROSCOPY Right   . SHOULDER OPEN ROTATOR CUFF REPAIR Right   . SPINAL FUSION  1996   "t10 down to my coccyx  . SPINE HARDWARE REMOVAL    . TOTAL ABDOMINAL HYSTERECTOMY    . TOTAL HIP ARTHROPLASTY Right 05/10/2014   [place]      by dr Carola Rhine  . TOTAL KNEE ARTHROPLASTY Left   . TOTAL KNEE ARTHROPLASTY Left   .  TOTAL SHOULDER ARTHROPLASTY Left 08/19/2016  . TOTAL SHOULDER ARTHROPLASTY Left 08/19/2016   Procedure: TOTAL SHOULDER ARTHROPLASTY;  Surgeon: Jones Broom, MD;  Location: [facility] OR;  Service: Orthopedics;  Laterality: Left;  Left total shoulder replacement     Medications: No outpatient medications have been marked as taking for the 12/04/19 encounter (Appointment) with Rosalio Macadamia, NP.     Allergies: Allergies  Allergen Reactions  . No Known Allergies     Social History: The patient  reports that she has been smoking cigarettes. She has a 25.00 pack-year smoking history. She has never used smokeless tobacco. She reports that she does not drink alcohol or use drugs.   Family History: The patient's ***family  history includes Chorea in her maternal grandfather; Dementia in her father, maternal aunt, and maternal aunt; Heart attack in an other family member; Other in her mother; Stroke (age of onset: 89) in her father.   Review of Systems: Please see the history of present illness.   All other systems are reviewed and negative.   Physical Exam: VS:  There were no vitals taken for this visit. [name]  BMI There is no height or weight on file to calculate BMI.  Wt Readings from Last 3 Encounters:  05/15/19 180 lb 12.8 oz (82 kg)  09/06/18 190 lb (86.2 kg)  02/27/18 169 lb (76.7 kg)    General: Pleasant. Well developed, well nourished and in no acute distress.   HEENT: Normal.  Neck: Supple, no JVD, carotid bruits, or masses noted.  Cardiac: ***Regular rate and rhythm. No murmurs, rubs, or gallops. No edema.  Respiratory:  Lungs are clear to auscultation bilaterally with normal work of breathing.  GI: Soft and nontender.  MS: No deformity or atrophy. Gait and ROM intact.  Skin: Warm and dry. Color is normal.  Neuro:  Strength and sensation are intact and no gross focal deficits noted.  Psych: Alert, appropriate and with normal affect.   LABORATORY DATA:  EKG:  EKG {ACTION; IS/IS TDD:[id number]} ordered today.  Personally reviewed by me. This demonstrates ***.  Lab Results  Component Value Date   WBC 11.3 (H) 02/28/2018   HGB 13.2 02/28/2018   HCT 39.5 02/28/2018   PLT 326 02/28/2018   GLUCOSE 184 (H) 02/28/2018   CHOL 97 (L) 10/17/2017   TRIG 115 10/17/2017   HDL 47 10/17/2017   LDLDIRECT 73.0 12/25/2014   LDLCALC 27 10/17/2017   ALT 27 02/28/2018   AST 32 02/28/2018   NA 134 (L) 02/28/2018   K 3.9 02/28/2018   CL 102 02/28/2018   CREATININE 0.71 02/28/2018   BUN 6 (L) 02/28/2018   CO2 23 02/28/2018   TSH 1.636 02/28/2018   INR 0.93 08/17/2016   HGBA1C 6.5 (H) 08/17/2016     BNP (last 3 results) No results for input(s): BNP in the last 8760 hours.  ProBNP (last 3  results) No results for input(s): PROBNP in the last 8760 hours.   Other Studies Reviewed Today:  ECHO IMPRESSIONS 03/2019  1. Left ventricular ejection fraction, by visual estimation, is 45 to 50%. The left ventricle has mildly decreased function. Mildly increased left ventricular size. There is no left ventricular hypertrophy. 2. Elevated mean left atrial pressure. 3. Left ventricular diastolic Doppler parameters are consistent with impaired relaxation pattern of LV diastolic filling. 4. Global right ventricle has normal systolic function.The right ventricular size is normal. 5. Left atrial size was normal. 6. Right atrial size was normal. 7. Mild mitral annular calcification.  8. The mitral valve is normal in structure. Trace mitral valve regurgitation. No evidence of mitral stenosis. 9. The tricuspid valve is normal in structure. Tricuspid valve regurgitation is mild. 10. The aortic valve is tricuspid Aortic valve regurgitation is trivial by color flow Doppler. Mild aortic valve sclerosis without stenosis. 11. The pulmonic valve was normal in structure. Pulmonic valve regurgitation is not visualized by color flow Doppler. 12. Normal pulmonary artery systolic pressure. 13. The inferior vena cava is normal in size with greater than 50% respiratory variability, suggesting right atrial pressure of 3 mmHg. 14. Septal dyssynergy with overall mildly reduced LV systolic function; grade 1 diastolic dysfunction; mild LVE; trace AI; mild TR; small perimembranous VSD. 15. Small membranous ventricular septal defect with.  Notes by Dr. Burt Knack - She definitely has a cardiomyopathy with mildly reduced LV function. I don't see much change between studies dating back to 2018. Valvular disease looks mild and VSD unchanged. I would push meds for cardiomyopathy if she will tolerate. Other question is whether cardiac MRI would be helpful to better quantify her LVEF? I'm not sure it would change  management just a thought.    Cardiac CathConclusionfrom 06/2015  Mild, nonobstructive CAD. Suspect non-cardiac chest pain     Assessment/Plan:  1. VSD - perimembranous - remains small - most recent echo noted.   2. CAD - non obstructive by cath from 2016 - CV risk factor modification remains challenging - she has no active symptoms.   3. NICM - EF seems to basically be unchanged - back to prior echo's - she is agreeable to try and increase her Coreg a bit - will try 3.125 mg in the AM and 6.25 mg in the pm - she has had worsening fatigue with higher doses in the past.   4. History of hyponatremia - her labs are checked by PCP.   5. Tobacco abuse - smoking less - total cessation encouraged   6. HLD - on statin  7. HTN - BP is ok   8. H/o VT: s/p ablation. No symptomatic recurrence.   9. Bipolar disorder - followed by psyche - not discussed  10. Prior AI noted on last year's echo - not appreciated on most recent study. Most recent study seems to be in line with prior studies - would follow for now.   [name] COVID-19 Education: The signs and symptoms of COVID-19 were discussed with the patient and how to seek care for testing (follow up with PCP or arrange E-visit).  The importance of social distancing, staying at home, hand hygiene and wearing a mask when out in public were discussed today.  Current medicines are reviewed with the patient today.  The patient does not have concerns regarding medicines other than what has been noted above.  The following changes have been made:  See above.  Labs/ tests ordered today include:   No orders of the defined types were placed in this encounter.    Disposition:   FU with *** in {gen number 2-35:361443} {Days to years:10300}.   Patient is agreeable to this plan and will call if any problems develop in the interim.   SignedTruitt Merle, NP  11/28/2019 7:59 AM  [place] [address] [place], [place]  [address] Phone: [phone] Fax: [phone]

## 2019-12-04 ENCOUNTER — Ambulatory Visit: Payer: Medicare Other | Admitting: Nurse Practitioner

## 2019-12-10 ENCOUNTER — Other Ambulatory Visit: Payer: Self-pay | Admitting: Neurology

## 2019-12-11 ENCOUNTER — Other Ambulatory Visit: Payer: Self-pay | Admitting: Family Medicine

## 2019-12-11 DIAGNOSIS — Z1231 Encounter for screening mammogram for malignant neoplasm of breast: Secondary | ICD-10-CM

## 2019-12-27 ENCOUNTER — Other Ambulatory Visit: Payer: Self-pay | Admitting: Gastroenterology

## 2019-12-27 DIAGNOSIS — R1319 Other dysphagia: Secondary | ICD-10-CM

## 2020-01-02 NOTE — Progress Notes (Signed)
CARDIOLOGY OFFICE NOTE  Date:  01/08/2020    [name] Date of Birth: March 04, 1956 Medical Record #[id number]  PCP:  Elias Else, MD  Cardiologist:  Kirt Boys    Chief Complaint  Patient presents with  . Follow-up    History of Present Illness: [name] is a 64 y.o. female who presents today for a follow up visit. Former patient of Dr. Alford Highland. Primarily follows with me. Dr. Excell Seltzer has agreed to co-manage.   She has a history of membranous VSD (small), cardiomyopathy (probably due to HTN, EF improved on most recent echo), and prior VT s/p ablation. In 2/15, she had a Lexiscan Cardiolite showing EF 61% and a fixed small apical defect with no ischemia. Repeat echo over the past several years have showed EF 45-50% with small stable perimembranous VSD and and normal RV. Other issues include HTN, HLD, DM, PSVT, GERD, anemia and mild valvular disease.  Cath in 2016 - mild disease - managed medically. She has continued to smoke. She has had remote fall with right femur fracture in 2015.   I last saw her in October - lots of orthopedic issues. Trying to smoke less. EF back to 45 to 50% - aorta looked ok and VSD unchanged.   The patient does not have symptoms concerning for COVID-19 infection (fever, chills, cough, or new shortness of breath).   Comes in today. Here alone.  She has been vaccinated. [place] can paint and do her collage there. Her sugars have gotten out of control - now on some type of injection - she is not sure of the name. Dealing with more bipolar depression. Felt that some of her psyche meds that were changed caused the lability in blood sugar. Looks like her A1C was way up. She has limited mobility but seeing PT and ortho in [place] now - off narcotics now. Her balance is challenging given that one leg is an inch shorter than the other. BP typically ok. Smoking less - only 2 a day. She seems much  happier. Not short of breath. No chest pain. She really feels like she is doing ok.   Past Medical History:  Diagnosis Date  . Anemia   . Anxiety   . Arthritis   . Depression   . GERD (gastroesophageal reflux disease)   . Heart murmur    "related to VSD"  . High cholesterol   . History of blood transfusion    "related to OR" (08/19/2016)  . History of hiatal hernia   . Hypertension   . Hyperthyroidism   . Mild cognitive impairment 09/06/2018  . Paroxysmal ventricular tachycardia (HCC)   . Type II diabetes mellitus (HCC)   . UTI (urinary tract infection)    being treated with Keflex  . Ventricular septal defect     Past Surgical History:  Procedure Laterality Date  . ABDOMINAL HYSTERECTOMY    . BACK SURGERY    . CARDIAC CATHETERIZATION N/A 06/23/2015   Procedure: Left Heart Cath and Coronary Angiography;  Surgeon: Laurey Morale, MD;  Location: [facility] INVASIVE CV LAB;  Service: Cardiovascular;  Laterality: N/A;  . CARDIAC CATHETERIZATION  1960   "VSD was so small; didn't need repaired"  . EXAM UNDER ANESTHESIA WITH MANIPULATION OF HIP Right 06/02/2014   dr Jerl Santos  . FRACTURE SURGERY    . HERNIA REPAIR    . HIP CLOSED REDUCTION Right 06/02/2014   Procedure: CLOSED  MANIPULATION HIP;  Surgeon: Velna Ochs, MD;  Location: [facility] OR;  Service: Orthopedics;  Laterality: Right;  . JOINT REPLACEMENT    . JOINT REPLACEMENT    . REFRACTIVE SURGERY Bilateral   . SHOULDER ARTHROSCOPY Right   . SHOULDER OPEN ROTATOR CUFF REPAIR Right   . SPINAL FUSION  1996   "t10 down to my coccyx  . SPINE HARDWARE REMOVAL    . TOTAL ABDOMINAL HYSTERECTOMY    . TOTAL HIP ARTHROPLASTY Right 05/10/2014   [place]      by dr Carola Rhine  . TOTAL KNEE ARTHROPLASTY Left   . TOTAL KNEE ARTHROPLASTY Left   . TOTAL SHOULDER ARTHROPLASTY Left 08/19/2016  . TOTAL SHOULDER ARTHROPLASTY Left 08/19/2016   Procedure: TOTAL SHOULDER ARTHROPLASTY;  Surgeon: Jones Broom, MD;  Location: [facility] OR;   Service: Orthopedics;  Laterality: Left;  Left total shoulder replacement     Medications: Current Meds  Medication Sig  . amLODipine (NORVASC) 5 MG tablet Take by mouth.  . AMLODIPINE BENZOATE PO Take 5 mg by mouth.  . ARIPiprazole (ABILIFY) 2 MG tablet TAKE 2 TABLETS BY MOUTH DAILY IN THE MORNING  . aspirin 81 MG tablet Take 81 mg by mouth daily.    [name] buPROPion (WELLBUTRIN XL) 300 MG 24 hr tablet Take 300 mg by mouth daily.    . carvedilol (COREG) 3.125 MG tablet Take 3.125 mg by mouth 2 (two) times daily with a meal. Taking 2 tablets in the am and 2 in the evening  . donepezil (ARICEPT) 5 MG tablet TAKE 1 TABLET(5 MG) BY MOUTH AT BEDTIME  . Fexofenadine HCl (MUCINEX ALLERGY PO) Take by mouth as needed.   . fluvoxaMINE (LUVOX) 100 MG tablet Take 300 mg by mouth at bedtime.   . gabapentin (NEURONTIN) 300 MG capsule Take by mouth.  . lamoTRIgine (LAMICTAL) 100 MG tablet Take 200 mg by mouth at bedtime.  . lamoTRIgine (LAMICTAL) 25 MG tablet Take 25 mg by mouth 2 (two) times daily.  [name] levothyroxine (SYNTHROID, LEVOTHROID) 88 MCG tablet Take 88 mcg by mouth daily before breakfast.  . Loperamide HCl (IMODIUM A-D PO) Take by mouth as needed.   [name] losartan (COZAAR) 100 MG tablet Take 100 mg by mouth daily.  . Multiple Vitamin (MULTIVITAMIN) tablet Take 1 tablet by mouth daily.    [name] omeprazole-sodium bicarbonate (ZEGERID) 40-1100 MG capsule Take 2 capsules by mouth at bedtime.  . pantoprazole (PROTONIX) 40 MG tablet SMARTSIG:1 Tablet(s) By Mouth Morning-Evening  . QUEtiapine (SEROQUEL) 300 MG tablet Take 300 mg by mouth 2 (two) times daily.   . rosuvastatin (CRESTOR) 20 MG tablet Take 20 mg by mouth at bedtime.  . [DISCONTINUED] metFORMIN (GLUCOPHAGE) 500 MG tablet Take 500 mg by mouth 2 (two) times daily with a meal.     Allergies: Allergies  Allergen Reactions  . No Known Allergies     Social History: The patient  reports that she has been smoking cigarettes. She has a 25.00  pack-year smoking history. She has never used smokeless tobacco. She reports that she does not drink alcohol and does not use drugs.   Family History: The patient's family history includes Chorea in her maternal grandfather; Dementia in her father, maternal aunt, and maternal aunt; Heart attack in an other family member; Other in her mother; Stroke (age of onset: 13) in her father.   Review of Systems: Please see the history of present illness.   All other systems are reviewed and negative.   Physical  Exam: VS:  BP 136/90   Pulse 80   Ht 5\' 1"  (1.549 m)   Wt 173 lb 6.4 oz (78.7 kg)   SpO2 97%   BMI 32.76 kg/m  .  BMI Body mass index is 32.76 kg/m.  Wt Readings from Last 3 Encounters:  01/08/20 173 lb 6.4 oz (78.7 kg)  05/15/19 180 lb 12.8 oz (82 kg)  09/06/18 190 lb (86.2 kg)    General: Pleasant. Alert. She is in no acute distress. Her weight is down. She looks good today. She is more upbeat.  Cardiac: Regular rate and rhythm. Systolic murmur I-II/VI noted. No edema.  Respiratory:  Lungs are clear to auscultation bilaterally with normal work of breathing.  GI: Soft and nontender.  MS: No deformity or atrophy. Gait and ROM intact. But she limps.   Skin: Warm and dry. Color is normal.  Neuro:  Strength and sensation are intact and no gross focal deficits noted.  Psych: Alert, appropriate and with normal affect.   LABORATORY DATA:  EKG:  EKG is not ordered today.    Lab Results  Component Value Date   WBC 11.3 (H) 02/28/2018   HGB 13.2 02/28/2018   HCT 39.5 02/28/2018   PLT 326 02/28/2018   GLUCOSE 184 (H) 02/28/2018   CHOL 97 (L) 10/17/2017   TRIG 115 10/17/2017   HDL 47 10/17/2017   LDLDIRECT 73.0 12/25/2014   LDLCALC 27 10/17/2017   ALT 27 02/28/2018   AST 32 02/28/2018   NA 134 (L) 02/28/2018   K 3.9 02/28/2018   CL 102 02/28/2018   CREATININE 0.71 02/28/2018   BUN 6 (L) 02/28/2018   CO2 23 02/28/2018   TSH 1.636 02/28/2018   INR 0.93 08/17/2016   HGBA1C  6.5 (H) 08/17/2016       BNP (last 3 results) No results for input(s): BNP in the last 8760 hours.  ProBNP (last 3 results) No results for input(s): PROBNP in the last 8760 hours.   Other Studies Reviewed Today:  ECHO IMPRESSIONS 03/2019  1. Left ventricular ejection fraction, by visual estimation, is 45 to 50%. The left ventricle has mildly decreased function. Mildly increased left ventricular size. There is no left ventricular hypertrophy. 2. Elevated mean left atrial pressure. 3. Left ventricular diastolic Doppler parameters are consistent with impaired relaxation pattern of LV diastolic filling. 4. Global right ventricle has normal systolic function.The right ventricular size is normal. 5. Left atrial size was normal. 6. Right atrial size was normal. 7. Mild mitral annular calcification. 8. The mitral valve is normal in structure. Trace mitral valve regurgitation. No evidence of mitral stenosis. 9. The tricuspid valve is normal in structure. Tricuspid valve regurgitation is mild. 10. The aortic valve is tricuspid Aortic valve regurgitation is trivial by color flow Doppler. Mild aortic valve sclerosis without stenosis. 11. The pulmonic valve was normal in structure. Pulmonic valve regurgitation is not visualized by color flow Doppler. 12. Normal pulmonary artery systolic pressure. 13. The inferior vena cava is normal in size with greater than 50% respiratory variability, suggesting right atrial pressure of 3 mmHg. 14. Septal dyssynergy with overall mildly reduced LV systolic function; grade 1 diastolic dysfunction; mild LVE; trace AI; mild TR; small perimembranous VSD. 15. Small membranous ventricular septal defect with.  04/2019 I reviewed her images and compared to prior studies. She definitely has a cardiomyopathy with mildly reduced LV function. I don't see much change between studies dating back to 2018. Valvular disease looks mild and VSD unchanged.  I would push  meds for cardiomyopathy if she will tolerate. Other question is whether cardiac MRI would be helpful to better quantify her LVEF? I'm not sure it would change management just a thought.   Echo Study Conclusions 11/2017  - Left ventricle: The cavity size was normal. There was mild concentric hypertrophy. Systolic function was normal. The estimated ejection fraction was in the range of 50% to 55%. Wall motion was normal; there were no regional wall motion abnormalities. There was an increased relative contribution of atrial contraction to ventricular filling. Doppler parameters are consistent with abnormal left ventricular relaxation (grade 1 diastolic dysfunction). - Ventricular septum: There was a defect in the perimembranous region consistent with a small VSD.[name] There was a small left to right shunt through a ventricular septal defect. Peak gradient across VSD (S): 5 mm Hg. - Aortic valve: Severe focal calcification involving the right coronary cusp. Right coronary cusp immobility was noted. There was mild to moderate regurgitation. Regurgitation pressure half-time: 446 ms. - Aorta: Ascending aorta diameter: 38 mm (ED). - Ascending aorta: The ascending aorta was mildly dilated. - Mitral valve: Calcified annulus. There was trivial regurgitation. - Tricuspid valve: There was trivial regurgitation.    Echo Study Conclusions 2018  - Left ventricle: The cavity size was normal. Wall thickness was normal. Systolic function was mildly reduced. The estimated ejection fraction was in the range of 45% to 50%. Diffuse hypokinesis. Doppler parameters are consistent with abnormal left ventricular relaxation (grade 1 diastolic dysfunction). - Ventricular septum: Small peri-membranous VSD noted with left to right flow. - Aortic valve: Trileaflet; calcified right coronary cusp. There was no stenosis. There was trivial regurgitation. - Aorta: Borderline  dilated aortic root and ascending aorta. Aortic root dimension: 38 mm (ED). Ascending aortic diameter: 39 mm (S). - Mitral valve: There was trivial regurgitation. - Right ventricle: The cavity size was normal. Systolic function was normal. - Tricuspid valve: Peak RV-RA gradient (S): 23 mm Hg. - Pulmonary arteries: PA peak pressure: 26 mm Hg (S). - Inferior vena cava: The vessel was normal in size. The respirophasic diameter changes were in the normal range (>= 50%), consistent with normal central venous pressure.  Impressions:  - Normal LV size with EF 45-50%, diffuse hypokinesis. Normal RV size and systolic function. There was a small peri-membranous VSD with left to right flow, this appears restrictive. Trivial aortic insufficiency.    Cardiac CathConclusionfrom 06/2015  Mild, nonobstructive CAD. Suspect non-cardiac chest pain     Assessment/Plan:  1. VSD - perimembranous - remains small.   2. Non obstructive CAD - would manage medically. Needs aggressive CV risk factor modification.   3. NICM - EF basically unchanged - could consider cardiac MRI but I think she is doing well - will keep her on her current regimen. She has no symptoms.  She is happy with how she is doing. She has not tolerated higher doses of Coreg in the past due to worsening fatigue.   4. Tobacco abuse -down to 2 cigs per day.   5. HLD - lab per PCP  6. HTN - BP better at home.   7. Prior VT ablation - no recurrence.   8. Mild valvular heart disease - will follow.   9. Bipolar disorder - per psyche  10.  DM - has gotten uncontrolled but she is working on this.   Current medicines are reviewed with the patient today.  The patient does not have concerns regarding medicines other than what has been noted  above.  The following changes have been made:  See above.  Labs/ tests ordered today include:   No orders of the defined types were placed in this  encounter.    Disposition:   FU with [place] in 6 months.     Patient is agreeable to this plan and will call if any problems develop in the interim.   SignedNorma Fredrickson, NP  01/08/2020 3:21 PM  [facility] [address] [place], [place]  [address] Phone: [phone] Fax: [phone]

## 2020-01-08 ENCOUNTER — Encounter: Payer: Self-pay | Admitting: Nurse Practitioner

## 2020-01-08 ENCOUNTER — Other Ambulatory Visit: Payer: Self-pay

## 2020-01-08 ENCOUNTER — Ambulatory Visit: Payer: Medicare Other | Admitting: Nurse Practitioner

## 2020-01-08 VITALS — BP 136/90 | HR 80 | Ht 61.0 in | Wt 173.4 lb

## 2020-01-08 DIAGNOSIS — I259 Chronic ischemic heart disease, unspecified: Secondary | ICD-10-CM | POA: Diagnosis not present

## 2020-01-08 DIAGNOSIS — E7800 Pure hypercholesterolemia, unspecified: Secondary | ICD-10-CM

## 2020-01-08 DIAGNOSIS — I10 Essential (primary) hypertension: Secondary | ICD-10-CM

## 2020-01-08 DIAGNOSIS — Q210 Ventricular septal defect: Secondary | ICD-10-CM | POA: Diagnosis not present

## 2020-01-08 DIAGNOSIS — I428 Other cardiomyopathies: Secondary | ICD-10-CM | POA: Diagnosis not present

## 2020-01-08 NOTE — Patient Instructions (Addendum)
After Visit Summary:  We will be checking the following labs today - NONE   Medication Instructions:    Continue with your current medicines.    If you need a refill on your cardiac medications before your next appointment, please call your pharmacy.     Testing/Procedures To Be Arranged:  N/A  Follow-Up:   See [place] in about 6 months.     At [facility], you and your health needs are our priority.  As part of our continuing mission to provide you with exceptional heart care, we have created designated Provider Care Teams.  These Care Teams include your primary Cardiologist (physician) and Advanced Practice Providers (APPs -  Physician Assistants and Nurse Practitioners) who all work together to provide you with the care you need, when you need it.  Special Instructions:  . Stay safe, wash your hands for at least 20 seconds and wear a mask when needed.  . It was good to talk with you today.    Call the [facility] office at [phone] if you have any questions, problems or concerns.

## 2020-01-09 ENCOUNTER — Ambulatory Visit
Admission: RE | Admit: 2020-01-09 | Discharge: 2020-01-09 | Disposition: A | Discharge status: Home / Self Care | Payer: Medicare Other | Source: Ambulatory Visit | Attending: Family Medicine | Admitting: Family Medicine

## 2020-01-09 DIAGNOSIS — Z1231 Encounter for screening mammogram for malignant neoplasm of breast: Secondary | ICD-10-CM

## 2020-01-10 ENCOUNTER — Other Ambulatory Visit: Payer: Self-pay

## 2020-01-10 ENCOUNTER — Ambulatory Visit
Admission: RE | Admit: 2020-01-10 | Discharge: 2020-01-10 | Disposition: A | Discharge status: Home / Self Care | Payer: Medicare Other | Source: Ambulatory Visit | Attending: Gastroenterology | Admitting: Gastroenterology

## 2020-01-10 DIAGNOSIS — R1319 Other dysphagia: Secondary | ICD-10-CM

## 2020-03-06 ENCOUNTER — Other Ambulatory Visit: Payer: Self-pay | Admitting: Neurology

## 2020-03-18 ENCOUNTER — Ambulatory Visit: Payer: Medicare Other | Admitting: Neurology

## 2020-03-18 NOTE — Progress Notes (Deleted)
PATIENT: [name] DOB: 1955-10-28  REASON FOR VISIT: follow up HISTORY FROM: patient  HISTORY OF PRESENT ILLNESS: Today 03/18/20 [name] is a 64 year old female with history of memory disturbance, was last seen in February 2020 by Dr. Patrecia Pace. She has a very prominent history of Alzheimer's disease. HISTORY 09/06/2018 Dr. Anne Hahn: Ms. Chesnut is a 64 year old right-handed white female with a history of some issues with memory dating back approximately 2 years.  The patient is quite concerned that she may be developing Alzheimer's disease as she has a very prominent family history for Alzheimer's, she claims that her maternal grandfather, mother and 2 maternal aunts had Alzheimer's disease.  The patient indicates that she has a brother and a sister who also have some troubles with memory.  The patient reports that she has difficulty with short-term and long-term memory, she has difficulty remembering names for people.  She has always been challenged with directions while driving, this has not changed any.  She has not been unsafe with driving.  She is able to keep up with her own medications and appointments, she is able to do her own finances fairly well.  The patient has been diagnosed with bipolar disorder, she mainly has depression.  She is on Seroquel for this, she has chronic pain issues as well and she does not sleep well at night, she takes Lyrica and is followed through a pain center with Dr. Jordan Likes.  The patient reports some gait problems associated with a leg length discrepancy, she has a much shorter right leg than the left.  She walks with a cane.  She feels weak all over, she does have some right foot numbness.  She denies any significant problems with headaches or dizziness.  She comes to this office for an evaluation.   REVIEW OF SYSTEMS: Out of a complete 14 system review of symptoms, the patient complains only of the following symptoms, and all other reviewed systems are  negative.  ALLERGIES: Allergies  Allergen Reactions  . No Known Allergies     HOME MEDICATIONS: Outpatient Medications Prior to Visit  Medication Sig Dispense Refill  . amLODipine (NORVASC) 5 MG tablet Take by mouth.    . AMLODIPINE BENZOATE PO Take 5 mg by mouth.    . ARIPiprazole (ABILIFY) 2 MG tablet TAKE 2 TABLETS BY MOUTH DAILY IN THE MORNING    . aspirin 81 MG tablet Take 81 mg by mouth daily.      [name] buPROPion (WELLBUTRIN XL) 300 MG 24 hr tablet Take 300 mg by mouth daily.      . carvedilol (COREG) 3.125 MG tablet Take 3.125 mg by mouth 2 (two) times daily with a meal. Taking 2 tablets in the am and 2 in the evening    . donepezil (ARICEPT) 5 MG tablet TAKE 1 TABLET(5 MG) BY MOUTH AT BEDTIME 90 tablet 0  . Fexofenadine HCl (MUCINEX ALLERGY PO) Take by mouth as needed.     . fluvoxaMINE (LUVOX) 100 MG tablet Take 300 mg by mouth at bedtime.     . gabapentin (NEURONTIN) 300 MG capsule Take by mouth.    . lamoTRIgine (LAMICTAL) 100 MG tablet Take 200 mg by mouth at bedtime.    . lamoTRIgine (LAMICTAL) 25 MG tablet Take 25 mg by mouth 2 (two) times daily.    [name] levothyroxine (SYNTHROID, LEVOTHROID) 88 MCG tablet Take 88 mcg by mouth daily before breakfast.    . Loperamide HCl (IMODIUM A-D PO) Take by mouth  as needed.     [name] losartan (COZAAR) 100 MG tablet Take 100 mg by mouth daily.    . Multiple Vitamin (MULTIVITAMIN) tablet Take 1 tablet by mouth daily.      [name] omeprazole-sodium bicarbonate (ZEGERID) 40-1100 MG capsule Take 2 capsules by mouth at bedtime.    . pantoprazole (PROTONIX) 40 MG tablet SMARTSIG:1 Tablet(s) By Mouth Morning-Evening    . QUEtiapine (SEROQUEL) 300 MG tablet Take 300 mg by mouth 2 (two) times daily.     . rosuvastatin (CRESTOR) 20 MG tablet Take 20 mg by mouth at bedtime.  11   No facility-administered medications prior to visit.    PAST MEDICAL HISTORY: Past Medical History:  Diagnosis Date  . Anemia   . Anxiety   . Arthritis   . Depression   . GERD  (gastroesophageal reflux disease)   . Heart murmur    "related to VSD"  . High cholesterol   . History of blood transfusion    "related to OR" (08/19/2016)  . History of hiatal hernia   . Hypertension   . Hyperthyroidism   . Mild cognitive impairment 09/06/2018  . Paroxysmal ventricular tachycardia (HCC)   . Type II diabetes mellitus (HCC)   . UTI (urinary tract infection)    being treated with Keflex  . Ventricular septal defect     PAST SURGICAL HISTORY: Past Surgical History:  Procedure Laterality Date  . ABDOMINAL HYSTERECTOMY    . BACK SURGERY    . CARDIAC CATHETERIZATION N/A 06/23/2015   Procedure: Left Heart Cath and Coronary Angiography;  Surgeon: Laurey Morale, MD;  Location: [facility] INVASIVE CV LAB;  Service: Cardiovascular;  Laterality: N/A;  . CARDIAC CATHETERIZATION  1960   "VSD was so small; didn't need repaired"  . EXAM UNDER ANESTHESIA WITH MANIPULATION OF HIP Right 06/02/2014   dr Jerl Santos  . FRACTURE SURGERY    . HERNIA REPAIR    . HIP CLOSED REDUCTION Right 06/02/2014   Procedure: CLOSED MANIPULATION HIP;  Surgeon: Velna Ochs, MD;  Location: [facility] OR;  Service: Orthopedics;  Laterality: Right;  . JOINT REPLACEMENT    . JOINT REPLACEMENT    . REFRACTIVE SURGERY Bilateral   . SHOULDER ARTHROSCOPY Right   . SHOULDER OPEN ROTATOR CUFF REPAIR Right   . SPINAL FUSION  1996   "t10 down to my coccyx  . SPINE HARDWARE REMOVAL    . TOTAL ABDOMINAL HYSTERECTOMY    . TOTAL HIP ARTHROPLASTY Right 05/10/2014   [place]      by dr Carola Rhine  . TOTAL KNEE ARTHROPLASTY Left   . TOTAL KNEE ARTHROPLASTY Left   . TOTAL SHOULDER ARTHROPLASTY Left 08/19/2016  . TOTAL SHOULDER ARTHROPLASTY Left 08/19/2016   Procedure: TOTAL SHOULDER ARTHROPLASTY;  Surgeon: Jones Broom, MD;  Location: [facility] OR;  Service: Orthopedics;  Laterality: Left;  Left total shoulder replacement    FAMILY HISTORY: Family History  Problem Relation Age of Onset  . Stroke Father 60        deceased  . Dementia Father   . Other Mother        alive  . Heart attack Other        multiple uncles have died with myocardial infarction  . Chorea Maternal Grandfather   . Dementia Maternal Aunt   . Dementia Maternal Aunt     SOCIAL HISTORY: Social History   Socioeconomic History  . Marital status: Married    Spouse name: Not on file  . Number of children:  Not on file  . Years of education: Not on file  . Highest education level: Master's degree (e.g., MA, MS, MEng, MEd, MSW, MBA)  Occupational History  . Occupation: Magazine features editor: SELF-EMPLOYED    Comment: former  Tobacco Use  . Smoking status: Current Every Day Smoker    Packs/day: 0.50    Years: 50.00    Pack years: 25.00    Types: Cigarettes  . Smokeless tobacco: Never Used  Vaping Use  . Vaping Use: Never used  Substance and Sexual Activity  . Alcohol use: No  . Drug use: No  . Sexual activity: Not on file  Other Topics Concern  . Not on file  Social History Narrative   Right handed   Caffeine 2-3 cups daily    Lives at home with husband    Social Determinants of Health   Financial Resource Strain:   . Difficulty of Paying Living Expenses: Not on file  Food Insecurity:   . Worried About Programme researcher, broadcasting/film/video in the Last Year: Not on file  . Ran Out of Food in the Last Year: Not on file  Transportation Needs:   . Lack of Transportation (Medical): Not on file  . Lack of Transportation (Non-Medical): Not on file  Physical Activity:   . Days of Exercise per Week: Not on file  . Minutes of Exercise per Session: Not on file  Stress:   . Feeling of Stress : Not on file  Social Connections:   . Frequency of Communication with Friends and Family: Not on file  . Frequency of Social Gatherings with Friends and Family: Not on file  . Attends Religious Services: Not on file  . Active Member of Clubs or Organizations: Not on file  . Attends Banker Meetings: Not on file  . Marital Status:  Not on file  Intimate Partner Violence:   . Fear of Current or Ex-Partner: Not on file  . Emotionally Abused: Not on file  . Physically Abused: Not on file  . Sexually Abused: Not on file      PHYSICAL EXAM  There were no vitals filed for this visit. There is no height or weight on file to calculate BMI.  Generalized: Well developed, in no acute distress   Neurological examination  Mentation: Alert oriented to time, place, history taking. Follows all commands speech and language fluent Cranial nerve II-XII: Pupils were equal round reactive to light. Extraocular movements were full, visual field were full on confrontational test. Facial sensation and strength were normal. Uvula tongue midline. Head turning and shoulder shrug  were normal and symmetric. Motor: The motor testing reveals 5 over 5 strength of all 4 extremities. Good symmetric motor tone is noted throughout.  Sensory: Sensory testing is intact to soft touch on all 4 extremities. No evidence of extinction is noted.  Coordination: Cerebellar testing reveals good finger-nose-finger and heel-to-shin bilaterally.  Gait and station: Gait is normal. Tandem gait is normal. Romberg is negative. No drift is seen.  Reflexes: Deep tendon reflexes are symmetric and normal bilaterally.   DIAGNOSTIC DATA (LABS, IMAGING, TESTING) - I reviewed patient records, labs, notes, testing and imaging myself where available.  Lab Results  Component Value Date   WBC 11.3 (H) 02/28/2018   HGB 13.2 02/28/2018   HCT 39.5 02/28/2018   MCV 95.4 02/28/2018   PLT 326 02/28/2018      Component Value Date/Time   NA 134 (L) 02/28/2018 1022   NA  142 11/04/2017 1425   K 3.9 02/28/2018 1022   CL 102 02/28/2018 1022   CO2 23 02/28/2018 1022   GLUCOSE 184 (H) 02/28/2018 1022   BUN 6 (L) 02/28/2018 1022   BUN 8 11/04/2017 1425   CREATININE 0.71 02/28/2018 1022   CREATININE 0.75 06/18/2015 1231   CALCIUM 8.6 (L) 02/28/2018 1022   PROT 6.7 02/28/2018  0150   PROT 6.8 11/04/2017 1425   ALBUMIN 3.8 02/28/2018 0150   ALBUMIN 4.5 11/04/2017 1425   AST 32 02/28/2018 0150   ALT 27 02/28/2018 0150   ALKPHOS 116 02/28/2018 0150   BILITOT 0.5 02/28/2018 0150   BILITOT <0.2 11/04/2017 1425   GFRNONAA >60 02/28/2018 1022   GFRAA >60 02/28/2018 1022   Lab Results  Component Value Date   CHOL 97 (L) 10/17/2017   HDL 47 10/17/2017   LDLCALC 27 10/17/2017   LDLDIRECT 73.0 12/25/2014   TRIG 115 10/17/2017   CHOLHDL 2.1 10/17/2017   Lab Results  Component Value Date   HGBA1C 6.5 (H) 08/17/2016   Lab Results  Component Value Date   VITAMINB12 559 09/06/2018   Lab Results  Component Value Date   TSH 1.636 02/28/2018      ASSESSMENT AND PLAN 64 y.o. year old female  has a past medical history of Anemia, Anxiety, Arthritis, Depression, GERD (gastroesophageal reflux disease), Heart murmur, High cholesterol, History of blood transfusion, History of hiatal hernia, Hypertension, Hyperthyroidism, Mild cognitive impairment (09/06/2018), Paroxysmal ventricular tachycardia (HCC), Type II diabetes mellitus (HCC), UTI (urinary tract infection), and Ventricular septal defect. here with ***   I spent 15 minutes with the patient. 50% of this time was spent   Margie Ege, [place], DNP 03/18/2020, 5:56 AM [facility] [address] [place], [place] [address] [phone]

## 2020-05-13 ENCOUNTER — Telehealth: Payer: Self-pay | Admitting: *Deleted

## 2020-05-13 NOTE — Telephone Encounter (Signed)
lvm for pt to call office to discuss moving Dec 15 appt with Norma Fredrickson, NP to the [facility] office at the med center.

## 2020-05-21 NOTE — Telephone Encounter (Signed)
S/w pt is agreeable to move appt time and date to [facility] location.

## 2020-06-03 ENCOUNTER — Other Ambulatory Visit: Payer: Self-pay | Admitting: Neurology

## 2020-06-09 NOTE — Progress Notes (Signed)
CARDIOLOGY OFFICE NOTE  Date:  06/23/2020    [name] Date of Birth: 1956/03/22 Medical Record #[id number]  PCP:  Elias Else, MD  Cardiologist:  Kirt Boys   Chief Complaint  Patient presents with  . Follow-up    Seen for Dr. Excell Seltzer    History of Present Illness: [name] is a 64 y.o. female who presents today for a 6 month check.  Former patient of Dr. Alford Highland.Primarily follows with me.Dr. Excell Seltzer has agreed to co-manage.  She has a history of membranous VSD (small), cardiomyopathy (probably due to HTN, EF improved on most recent echo), and prior VT s/p ablation. In 2/15, she had a Lexiscan Cardiolite showing EF 61% and a fixed small apical defect with no ischemia. Repeat echo over the past several years have showedEF 45-50% with small stable perimembranous VSD and and normal RV. Other issues include HTN, HLD, DM, PSVT, GERD, anemia and mild valvular disease. Cath in 2016 - mild disease - managed medically. She has continued to smoke. She has had remote fall with right femur fracture in 2015.   When seen last October - lots of orthopedic issues. Trying to smoke less. EF back to 45 to 50% - aorta looked ok and VSD unchanged. I last saw her in June - sugars out of control. Dealing with her bipolar depression. Limited mobility. Was off narcotics. Smoking less.   Comes in today. Here alone. She is back staying here more in [place] - her son has come and is staying with them with his family. She is happy. All "good". Only smoking 2 cigs per day. No chest pain. Breathing is good. She is getting out - using her cane or wheelchair - keeps her social and engaged. No palpitations. She feels like she is doing well. Does get a little dizzy if BP runs lower - typically feels good at 140.   Past Medical History:  Diagnosis Date  . Anemia   . Anxiety   . Arthritis   . Depression   . GERD (gastroesophageal reflux disease)   . Heart murmur     "related to VSD"  . High cholesterol   . History of blood transfusion    "related to OR" (08/19/2016)  . History of hiatal hernia   . Hypertension   . Hyperthyroidism   . Mild cognitive impairment 09/06/2018  . Paroxysmal ventricular tachycardia (HCC)   . Type II diabetes mellitus (HCC)   . UTI (urinary tract infection)    being treated with Keflex  . Ventricular septal defect     Past Surgical History:  Procedure Laterality Date  . ABDOMINAL HYSTERECTOMY    . BACK SURGERY    . CARDIAC CATHETERIZATION N/A 06/23/2015   Procedure: Left Heart Cath and Coronary Angiography;  Surgeon: Laurey Morale, MD;  Location: [facility] INVASIVE CV LAB;  Service: Cardiovascular;  Laterality: N/A;  . CARDIAC CATHETERIZATION  1960   "VSD was so small; didn't need repaired"  . EXAM UNDER ANESTHESIA WITH MANIPULATION OF HIP Right 06/02/2014   dr Jerl Santos  . FRACTURE SURGERY    . HERNIA REPAIR    . HIP CLOSED REDUCTION Right 06/02/2014   Procedure: CLOSED MANIPULATION HIP;  Surgeon: Velna Ochs, MD;  Location: [facility] OR;  Service: Orthopedics;  Laterality: Right;  . JOINT REPLACEMENT    . JOINT REPLACEMENT    . REFRACTIVE SURGERY Bilateral   . SHOULDER ARTHROSCOPY Right   . SHOULDER OPEN ROTATOR CUFF REPAIR Right   .  SPINAL FUSION  1996   "t10 down to my coccyx  . SPINE HARDWARE REMOVAL    . TOTAL ABDOMINAL HYSTERECTOMY    . TOTAL HIP ARTHROPLASTY Right 05/10/2014   [place]      by dr Carola Rhine  . TOTAL KNEE ARTHROPLASTY Left   . TOTAL KNEE ARTHROPLASTY Left   . TOTAL SHOULDER ARTHROPLASTY Left 08/19/2016  . TOTAL SHOULDER ARTHROPLASTY Left 08/19/2016   Procedure: TOTAL SHOULDER ARTHROPLASTY;  Surgeon: Jones Broom, MD;  Location: [facility] OR;  Service: Orthopedics;  Laterality: Left;  Left total shoulder replacement     Medications: Current Meds  Medication Sig  . amLODipine (NORVASC) 5 MG tablet Take by mouth.  . ARIPiprazole (ABILIFY) 2 MG tablet TAKE 2 TABLETS BY MOUTH DAILY IN THE  MORNING  . aspirin 81 MG tablet Take 81 mg by mouth daily.    [name] buPROPion (WELLBUTRIN XL) 300 MG 24 hr tablet Take 300 mg by mouth daily.    . carvedilol (COREG) 3.125 MG tablet Take 3.125 mg by mouth 2 (two) times daily with a meal. Taking 2 tablets in the am and 2 in the evening  . donepezil (ARICEPT) 5 MG tablet TAKE 1 TABLET(5 MG) BY MOUTH AT BEDTIME  . Fexofenadine HCl (MUCINEX ALLERGY PO) Take by mouth as needed.   . fluvoxaMINE (LUVOX) 100 MG tablet Take 300 mg by mouth at bedtime.   . gabapentin (NEURONTIN) 300 MG capsule Take by mouth.  . lamoTRIgine (LAMICTAL) 25 MG tablet Take 25 mg by mouth 2 (two) times daily.  [name] levothyroxine (SYNTHROID, LEVOTHROID) 88 MCG tablet Take 88 mcg by mouth daily before breakfast.  . Loperamide HCl (IMODIUM A-D PO) Take by mouth as needed.   [name] losartan (COZAAR) 100 MG tablet Take 100 mg by mouth daily.  . Multiple Vitamin (MULTIVITAMIN) tablet Take 1 tablet by mouth daily.    [name] omeprazole-sodium bicarbonate (ZEGERID) 40-1100 MG capsule Take 2 capsules by mouth at bedtime.  [name] OZEMPIC, 0.25 OR 0.5 MG/DOSE, 2 MG/1.5ML SOPN Inject into the skin once a week.  . pantoprazole (PROTONIX) 40 MG tablet SMARTSIG:1 Tablet(s) By Mouth Morning-Evening  . QUEtiapine (SEROQUEL) 300 MG tablet Take 300 mg by mouth 2 (two) times daily.   . rosuvastatin (CRESTOR) 20 MG tablet Take 20 mg by mouth at bedtime.  . traZODone (DESYREL) 50 MG tablet Take 50-100 mg by mouth at bedtime.     Allergies: Allergies  Allergen Reactions  . No Known Allergies     Social History: The patient  reports that she has been smoking cigarettes. She has a 25.00 pack-year smoking history. She has never used smokeless tobacco. She reports that she does not drink alcohol and does not use drugs.   Family History: The patient's family history includes Chorea in her maternal grandfather; Dementia in her father, maternal aunt, and maternal aunt; Heart attack in an other family member; Other in  her mother; Stroke (age of onset: 27) in her father.   Review of Systems: Please see the history of present illness.   All other systems are reviewed and negative.   Physical Exam: VS:  BP 140/88   Pulse 75   Ht  (1.549 m)   Wt 179 lb (81.2 kg)   SpO2 96%   BMI 33.82 kg/m  .  BMI Body mass index is 33.82 kg/m.  Wt Readings from Last 3 Encounters:  06/23/20 179 lb (81.2 kg)  01/08/20 173 lb 6.4 oz (78.7 kg)  05/15/19 180  lb 12.8 oz (82 kg)    General: Pleasant. Alert. She is in no acute distress.   Cardiac: Regular rate and rhythm. Soft murmur noted.  No edema.  Respiratory:  Lungs are clear to auscultation bilaterally with normal work of breathing.  GI: Soft and nontender.  MS: No deformity or atrophy. Gait and ROM intact. She walks with a cane and has a limp. Right leg shorter than left.  Skin: Warm and dry. Color is normal.  Neuro:  Strength and sensation are intact and no gross focal deficits noted.  Psych: Alert, appropriate and with normal affect.   LABORATORY DATA:  EKG:  EKG is ordered today.  Personally reviewed by me. This demonstrates NSR - LAFB - non specific T wave changes.   Lab Results  Component Value Date   WBC 11.3 (H) 02/28/2018   HGB 13.2 02/28/2018   HCT 39.5 02/28/2018   PLT 326 02/28/2018   GLUCOSE 184 (H) 02/28/2018   CHOL 97 (L) 10/17/2017   TRIG 115 10/17/2017   HDL 47 10/17/2017   LDLDIRECT 73.0 12/25/2014   LDLCALC 27 10/17/2017   ALT 27 02/28/2018   AST 32 02/28/2018   NA 134 (L) 02/28/2018   K 3.9 02/28/2018   CL 102 02/28/2018   CREATININE 0.71 02/28/2018   BUN 6 (L) 02/28/2018   CO2 23 02/28/2018   TSH 1.636 02/28/2018   INR 0.93 08/17/2016   HGBA1C 6.5 (H) 08/17/2016       BNP (last 3 results) No results for input(s): BNP in the last 8760 hours.  ProBNP (last 3 results) No results for input(s): PROBNP in the last 8760 hours.   Other Studies Reviewed Today:  ECHOIMPRESSIONS9/2020  1. Left ventricular  ejection fraction, by visual estimation, is 45 to 50%. The left ventricle has mildly decreased function. Mildly increased left ventricular size. There is no left ventricular hypertrophy. 2. Elevated mean left atrial pressure. 3. Left ventricular diastolic Doppler parameters are consistent with impaired relaxation pattern of LV diastolic filling. 4. Global right ventricle has normal systolic function.The right ventricular size is normal. 5. Left atrial size was normal. 6. Right atrial size was normal. 7. Mild mitral annular calcification. 8. The mitral valve is normal in structure. Trace mitral valve regurgitation. No evidence of mitral stenosis. 9. The tricuspid valve is normal in structure. Tricuspid valve regurgitation is mild. 10. The aortic valve is tricuspid Aortic valve regurgitation is trivial by color flow Doppler. Mild aortic valve sclerosis without stenosis. 11. The pulmonic valve was normal in structure. Pulmonic valve regurgitation is not visualized by color flow Doppler. 12. Normal pulmonary artery systolic pressure. 13. The inferior vena cava is normal in size with greater than 50% respiratory variability, suggesting right atrial pressure of 3 mmHg. 14. Septal dyssynergy with overall mildly reduced LV systolic function; grade 1 diastolic dysfunction; mild LVE; trace AI; mild TR; small perimembranous VSD. 15. Small membranous ventricular septal defect with.  Mamie Nick I reviewed her images and compared to prior studies. She definitely has a cardiomyopathy with mildly reduced LV function. I don't see much change between studies dating back to 2018. Valvular disease looks mild and VSD unchanged. I would push meds for cardiomyopathy if she will tolerate. Other question is whether cardiac MRI would be helpful to better quantify her LVEF? I'm not sure it would change management just a thought.  EchoStudy Conclusions5/2019  - Left ventricle: The cavity size was normal. There  was mild concentric hypertrophy. Systolic function was normal. The  estimated ejection fraction was in the range of 50% to 55%. Wall motion was normal; there were no regional wall motion abnormalities. There was an increased relative contribution of atrial contraction to ventricular filling. Doppler parameters are consistent with abnormal left ventricular relaxation (grade 1 diastolic dysfunction). - Ventricular septum: There was a defect in the perimembranous region consistent with a small VSD.[name] There was a small left to right shunt through a ventricular septal defect. Peak gradient across VSD (S): 5 mm Hg. - Aortic valve: Severe focal calcification involving the right coronary cusp. Right coronary cusp immobility was noted. There was mild to moderate regurgitation. Regurgitation pressure half-time: 446 ms. - Aorta: Ascending aorta diameter: 38 mm (ED). - Ascending aorta: The ascending aorta was mildly dilated. - Mitral valve: Calcified annulus. There was trivial regurgitation. - Tricuspid valve: There was trivial regurgitation.    EchoStudy Conclusions2018  - Left ventricle: The cavity size was normal. Wall thickness was normal. Systolic function was mildly reduced. The estimated ejection fraction was in the range of 45% to 50%. Diffuse hypokinesis. Doppler parameters are consistent with abnormal left ventricular relaxation (grade 1 diastolic dysfunction). - Ventricular septum: Small peri-membranous VSD noted with left to right flow. - Aortic valve: Trileaflet; calcified right coronary cusp. There was no stenosis. There was trivial regurgitation. - Aorta: Borderline dilated aortic root and ascending aorta. Aortic root dimension: 38 mm (ED). Ascending aortic diameter: 39 mm (S). - Mitral valve: There was trivial regurgitation. - Right ventricle: The cavity size was normal. Systolic function was normal. - Tricuspid valve: Peak  RV-RA gradient (S): 23 mm Hg. - Pulmonary arteries: PA peak pressure: 26 mm Hg (S). - Inferior vena cava: The vessel was normal in size. The respirophasic diameter changes were in the normal range (>= 50%), consistent with normal central venous pressure.  Impressions:  - Normal LV size with EF 45-50%, diffuse hypokinesis. Normal RV size and systolic function. There was a small peri-membranous VSD with left to right flow, this appears restrictive. Trivial aortic insufficiency.    Cardiac CathConclusionfrom 06/2015  Mild, nonobstructive CAD. Suspect non-cardiac chest pain     Assessment/Plan:  1. VSD - perimembranous - has remained small - will get her echo updated on return.   2. Non obstructive CAD - managed medically - no worrisome symptoms noted.   3. NICM - we could still consider cardiac MRi if needed but she has done well and continues to do well - she has not tolerated higher doses of Coreg in the past due to worsening fatigue - for now, no changes with current regimen.    4. Tobacco abuse- she has tapered down - total cessation encouraged.   5. HLD - recent labs noted.   6. HTN - would follow - she tends to get dizzy with lower readings.   7. Prior VT ablation without recurrence - no palpitations.   8. Mild valvular heart disease - to follow - will get her echo updated on return.   9. Bipolar disorder - per psyche.   10. DM - per PCP.    Current medicines are reviewed with the patient today.  The patient does not have concerns regarding medicines other than what has been noted above.  The following changes have been made:  See above.  Labs/ tests ordered today include:    Orders Placed This Encounter  Procedures  . EKG 12-Lead  . ECHOCARDIOGRAM COMPLETE     Disposition:   FU with Dr. Excell Seltzer in 6 months with  an echo prior. Overall, she is felt to be doing ok. She is aware that I am leaving in February.    Patient is agreeable  to this plan and will call if any problems develop in the interim.   SignedNorma Fredrickson, NP  06/23/2020 3:56 PM  [facility] [address] [place], [place]  [address] Phone: [phone] Fax: [phone]

## 2020-06-23 ENCOUNTER — Other Ambulatory Visit: Payer: Self-pay

## 2020-06-23 ENCOUNTER — Ambulatory Visit: Payer: Medicare Other | Admitting: Nurse Practitioner

## 2020-06-23 ENCOUNTER — Encounter: Payer: Self-pay | Admitting: Nurse Practitioner

## 2020-06-23 VITALS — BP 140/88 | HR 75 | Ht 61.0 in | Wt 179.0 lb

## 2020-06-23 DIAGNOSIS — I259 Chronic ischemic heart disease, unspecified: Secondary | ICD-10-CM | POA: Diagnosis not present

## 2020-06-23 DIAGNOSIS — Q210 Ventricular septal defect: Secondary | ICD-10-CM

## 2020-06-23 DIAGNOSIS — I10 Essential (primary) hypertension: Secondary | ICD-10-CM

## 2020-06-23 DIAGNOSIS — E7800 Pure hypercholesterolemia, unspecified: Secondary | ICD-10-CM

## 2020-06-23 DIAGNOSIS — I428 Other cardiomyopathies: Secondary | ICD-10-CM | POA: Diagnosis not present

## 2020-06-23 NOTE — Patient Instructions (Addendum)
After Visit Summary:  We will be checking the following labs today - NONE   Medication Instructions:    Continue with your current medicines.    If you need a refill on your cardiac medications before your next appointment, please call your pharmacy.     Testing/Procedures To Be Arranged:  Echocardiogram in 6 months  Follow-Up:   See Dr. Excell Seltzer in 6 months - following the echocardiogram.     At [facility], you and your health needs are our priority.  As part of our continuing mission to provide you with exceptional heart care, we have created designated Provider Care Teams.  These Care Teams include your primary Cardiologist (physician) and Advanced Practice Providers (APPs -  Physician Assistants and Nurse Practitioners) who all work together to provide you with the care you need, when you need it.  Special Instructions:  . Stay safe, wash your hands for at least 20 seconds and wear a mask when needed.  . It was good to talk with you today.    Call the [facility] office at [phone] if you have any questions, problems or concerns.

## 2020-07-02 ENCOUNTER — Ambulatory Visit: Payer: Medicare Other | Admitting: Nurse Practitioner

## 2020-07-29 ENCOUNTER — Telehealth: Payer: Self-pay | Admitting: Neurology

## 2020-07-29 MED ORDER — DONEPEZIL HCL 5 MG PO TABS: ORAL_TABLET | ORAL | 0 refills | Status: DC

## 2020-07-29 NOTE — Telephone Encounter (Signed)
Aricept refilled x 3 months, FU scheduled for 10/07/20.

## 2020-07-29 NOTE — Telephone Encounter (Signed)
Pt is asking if she can get a refill on her donepezil (ARICEPT) 5 MG tablet.  Pt has scheduled her annual f/u.  She states she has been without this for a few months now.

## 2020-07-29 NOTE — Addendum Note (Signed)
Addended by: [place] Pink on: 07/29/2020 04:18 PM   Modules accepted: Orders

## 2020-10-16 ENCOUNTER — Ambulatory Visit: Payer: Medicare Other | Admitting: Neurology

## 2020-10-24 ENCOUNTER — Other Ambulatory Visit: Payer: Self-pay | Admitting: Neurology

## 2020-11-25 ENCOUNTER — Ambulatory Visit (HOSPITAL_COMMUNITY): Payer: Medicare Other | Attending: Cardiology

## 2020-11-25 ENCOUNTER — Encounter (HOSPITAL_COMMUNITY): Payer: Self-pay | Admitting: Nurse Practitioner

## 2020-11-25 ENCOUNTER — Encounter (HOSPITAL_COMMUNITY): Payer: Self-pay

## 2020-12-09 ENCOUNTER — Telehealth (HOSPITAL_COMMUNITY): Payer: Self-pay | Admitting: Nurse Practitioner

## 2020-12-09 NOTE — Telephone Encounter (Signed)
Just an FYI. We have made several attempts to contact this patient including sending a letter to schedule or reschedule their echocardiogram. We will be removing the patient from the echo WQ.  11/25/20 NO SHOWED -Mailed letter [facility]     Thank you  

## 2020-12-11 ENCOUNTER — Other Ambulatory Visit: Payer: Self-pay | Admitting: Family Medicine

## 2020-12-11 DIAGNOSIS — Z1231 Encounter for screening mammogram for malignant neoplasm of breast: Secondary | ICD-10-CM

## 2020-12-12 ENCOUNTER — Other Ambulatory Visit (HOSPITAL_COMMUNITY): Payer: Medicare Other

## 2020-12-16 ENCOUNTER — Ambulatory Visit: Payer: Medicare Other

## 2020-12-24 ENCOUNTER — Ambulatory Visit: Payer: Medicare Other | Admitting: Cardiovascular Disease

## 2021-01-07 ENCOUNTER — Other Ambulatory Visit (HOSPITAL_COMMUNITY): Payer: Medicare Other

## 2021-01-21 ENCOUNTER — Telehealth (HOSPITAL_COMMUNITY): Payer: Self-pay | Admitting: Nurse Practitioner

## 2021-01-21 ENCOUNTER — Telehealth: Payer: Self-pay | Admitting: Neurology

## 2021-01-21 ENCOUNTER — Encounter (HOSPITAL_COMMUNITY): Payer: Self-pay | Admitting: Nurse Practitioner

## 2021-01-21 MED ORDER — DONEPEZIL HCL 5 MG PO TABS: 5.0000 mg | ORAL_TABLET | Freq: Every day | ORAL | 0 refills | Status: DC

## 2021-01-21 NOTE — Telephone Encounter (Signed)
Pt requesting refill for donepezil (ARICEPT) 5 MG tablet. Pharmacy [facility] [phone].

## 2021-01-21 NOTE — Telephone Encounter (Signed)
Just an FYI. We have made several attempts to contact this patient including sending a letter to schedule or reschedule their echocardiogram. We will be removing the patient from the echo WQ.  11/25/20 NO SHOWED -Mailed letter [facility]     Thank you

## 2021-01-21 NOTE — Telephone Encounter (Signed)
Refills sent with following note:  Please keep pending follow up on 02/17/21. No further refills can be provided until the appt has been completed.  I also called to provide her with this information.

## 2021-01-22 ENCOUNTER — Ambulatory Visit: Payer: Medicare Other | Admitting: Neurology

## 2021-02-05 LAB — COLOGUARD

## 2021-02-17 ENCOUNTER — Ambulatory Visit: Payer: Medicare Other | Admitting: Neurology

## 2021-02-27 LAB — COLOGUARD: COLOGUARD: NEGATIVE

## 2021-03-30 ENCOUNTER — Encounter: Payer: Self-pay | Admitting: Neurology

## 2021-03-30 ENCOUNTER — Ambulatory Visit: Payer: Medicare Other | Admitting: Neurology

## 2021-03-30 VITALS — BP 135/84 | HR 97 | Ht 61.0 in | Wt 172.0 lb

## 2021-03-30 DIAGNOSIS — G3184 Mild cognitive impairment, so stated: Secondary | ICD-10-CM | POA: Diagnosis not present

## 2021-03-30 MED ORDER — DONEPEZIL HCL 10 MG PO TABS: 10.0000 mg | ORAL_TABLET | Freq: Every day | ORAL | 1 refills | Status: DC

## 2021-03-30 MED ORDER — DONEPEZIL HCL 5 MG PO TABS: 5.0000 mg | ORAL_TABLET | Freq: Every day | ORAL | 0 refills | Status: DC

## 2021-03-30 NOTE — Progress Notes (Signed)
Reason for visit: Memory disturbance  [name] is an 65 y.o. female  History of present illness:  [name] is a 65 year old right-handed white female with a history of a memory disturbance consistent with mild cognitive impairment.  The patient believes that she has had gradual worsening of her memory over time.  She has a prominent family history of memory difficulties on the mother's side of the family.  She will lose her train of thought while speaking.  She is easily distractible.  She will oftentimes walk into a room and cannot remember why she went in there.  She is independent in all of her activities of daily living, however.  She does suffer some depression that has been worse since the COVID outbreak.  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  She does report chronic insomnia, she will get 4 to 5 hours of sleep at night and has fatigue during the day.  She will not nap during the day.  She cannot get to sleep until about 2 or 3 in the morning.  She has been on Aricept taking 5 mg daily and has tolerated this.  She has been out of her medications for about a month.  Past Medical History:  Diagnosis Date   Anemia    Anxiety    Arthritis    Depression    GERD (gastroesophageal reflux disease)    Heart murmur    "related to VSD"   High cholesterol    History of blood transfusion    "related to OR" (08/19/2016)   History of hiatal hernia    Hypertension    Hyperthyroidism    Mild cognitive impairment 09/06/2018   Paroxysmal ventricular tachycardia (HCC)    Type II diabetes mellitus (HCC)    UTI (urinary tract infection)    being treated with Keflex   Ventricular septal defect     Past Surgical History:  Procedure Laterality Date   ABDOMINAL HYSTERECTOMY     BACK SURGERY     CARDIAC CATHETERIZATION N/A 06/23/2015   Procedure: Left Heart Cath and Coronary Angiography;  Surgeon: Laurey Morale, MD;  Location: [facility] INVASIVE CV LAB;  Service:  Cardiovascular;  Laterality: N/A;   CARDIAC CATHETERIZATION  1960   "VSD was so small; didn't need repaired"   EXAM UNDER ANESTHESIA WITH MANIPULATION OF HIP Right 06/02/2014   dr Jerl Santos   FRACTURE SURGERY     HERNIA REPAIR     HIP CLOSED REDUCTION Right 06/02/2014   Procedure: CLOSED MANIPULATION HIP;  Surgeon: Velna Ochs, MD;  Location: [facility] OR;  Service: Orthopedics;  Laterality: Right;   JOINT REPLACEMENT     JOINT REPLACEMENT     REFRACTIVE SURGERY Bilateral    SHOULDER ARTHROSCOPY Right    SHOULDER OPEN ROTATOR CUFF REPAIR Right    SPINAL FUSION  1996   "t10 down to my coccyx   SPINE HARDWARE REMOVAL     TOTAL ABDOMINAL HYSTERECTOMY     TOTAL HIP ARTHROPLASTY Right 05/10/2014   [facility]      by dr Cristal Deer olcott   TOTAL KNEE ARTHROPLASTY Left    TOTAL KNEE ARTHROPLASTY Left    TOTAL SHOULDER ARTHROPLASTY Left 08/19/2016   TOTAL SHOULDER ARTHROPLASTY Left 08/19/2016   Procedure: TOTAL SHOULDER ARTHROPLASTY;  Surgeon: Jones Broom, MD;  Location: [facility] OR;  Service: Orthopedics;  Laterality: Left;  Left total shoulder replacement    Family History  Problem Relation Age of Onset  Other Mother        alive   Stroke Father 26       deceased   Dementia Father    Chorea Maternal Grandfather    Dementia Maternal Aunt    Dementia Maternal Aunt    Heart attack Other        multiple uncles have died with myocardial infarction    Social history:  reports that she has been smoking cigarettes. She has never used smokeless tobacco. She reports that she does not drink alcohol and does not use drugs.    Allergies  Allergen Reactions   No Known Allergies     Medications:  Prior to Admission medications   Medication Sig Start Date End Date Taking? Authorizing Provider  amLODipine (NORVASC) 5 MG tablet Take by mouth. 10/01/19  Yes [provider]  ARIPiprazole (ABILIFY) 2 MG tablet TAKE 2 TABLETS BY MOUTH DAILY IN THE MORNING 11/14/19  Yes [provider]  aspirin 81 MG tablet Take 81 mg by mouth daily.     Yes [provider]  buPROPion (WELLBUTRIN XL) 300 MG 24 hr tablet Take 300 mg by mouth daily.     Yes [provider]  carvedilol (COREG) 3.125 MG tablet Take 3.125 mg by mouth 2 (two) times daily with a meal. Taking 2 tablets in the am and 2 in the evening   Yes [provider]  fluvoxaMINE (LUVOX) 100 MG tablet Take 300 mg by mouth at bedtime.    Yes [provider]  gabapentin (NEURONTIN) 100 MG capsule Take 200 mg by mouth at bedtime as needed. 03/02/21  Yes [provider]  lamoTRIgine (LAMICTAL) 100 MG tablet Take 200 mg by mouth at bedtime. 03/29/21  Yes [provider]  levothyroxine (SYNTHROID, LEVOTHROID) 88 MCG tablet Take 88 mcg by mouth daily before breakfast.   Yes [provider]  Loperamide HCl (IMODIUM A-D PO) Take by mouth as needed.    Yes [provider]  losartan (COZAAR) 100 MG tablet Take 100 mg by mouth daily.   Yes [provider]  meloxicam (MOBIC) 15 MG tablet Take 15 mg by mouth daily. 03/29/21  Yes [provider]  metFORMIN (GLUCOPHAGE) 500 MG tablet Take 500 mg by mouth 2 (two) times daily. 02/10/21  Yes [provider]  Multiple Vitamin (MULTIVITAMIN) tablet Take 1 tablet by mouth daily.     Yes [provider]  OZEMPIC, 0.25 OR 0.5 MG/DOSE, 2 MG/1.5ML SOPN Inject into the skin once a week. 06/19/20  Yes [provider]  pantoprazole (PROTONIX) 40 MG tablet SMARTSIG:1 Tablet(s) By Mouth Morning-Evening 12/11/19  Yes [provider]  QUEtiapine (SEROQUEL) 300 MG tablet Take 300 mg by mouth 2 (two) times daily.    Yes [provider]  rosuvastatin (CRESTOR) 20 MG tablet Take 20 mg by mouth at bedtime. 05/28/16  Yes [provider]  traZODone (DESYREL) 50 MG tablet Take 50-100 mg by mouth at bedtime. 05/07/20  Yes [provider]  donepezil (ARICEPT) 5 MG  tablet Take 1 tablet (5 mg total) by mouth at bedtime. Please keep pending follow up on 02/17/21. No further refills can be provided until the appt has been completed. Patient not taking: Reported on 03/30/2021 01/21/21   York Spaniel, MD    ROS:  Out of a complete 14 system review of symptoms, the patient complains only of the following symptoms, and all other reviewed systems are negative.  Memory problems Walking difficulty  Depression Insomnia  Blood pressure 135/84, pulse 97, height 5\' 1"  (1.549 m), weight 172 lb (78 kg).  Physical Exam  General: The patient is alert and cooperative at the time of the examination.  Skin: No significant peripheral edema is noted.   Neurologic Exam  Mental status: The patient is alert and oriented x 3 at the time of the examination. The patient has apparent normal recent and remote memory, with an apparently normal attention span and concentration ability.  Mini-Mental status examination done today shows a total score of 25/30.  The patient is able to name 16 four-legged animals in 60 seconds.   Cranial nerves: Facial symmetry is present. Speech is normal, no aphasia or dysarthria is noted. Extraocular movements are full. Visual fields are full.  Motor: The patient has good strength in all 4 extremities, with exception of 4 -/5 strength with hip flexion bilaterally.  Sensory examination: Soft touch sensation is symmetric on the face, arms, and legs.  Coordination: The patient has good finger-nose-finger bilaterally.  The patient is not able to perform heel-to-shin well on either side.  Gait and station: The patient has an abnormal gait associated with a severe leg length discrepancy, right leg shorter than the left.  She uses a cane for ambulation.  Reflexes: Deep tendon reflexes are symmetric.   Assessment/Plan:  1.  Mild cognitive impairment  2.  History of depression  The patient does have some underlying memory issues that may be  worsened by her underlying depression and chronic insomnia.  She will go back on Aricept taking 5 mg at night for a month and then go up to 10 mg at night.  She will follow-up here in 6 months, in the future she can be followed through Dr. .  Marjory Lies MD 03/30/2021 3:18 PM  [facility] [address] [place], [place]  Phone [phone] Fax [phone]

## 2021-04-03 ENCOUNTER — Ambulatory Visit: Payer: Medicare Other | Admitting: Neurology

## 2021-04-13 ENCOUNTER — Other Ambulatory Visit: Payer: Self-pay | Admitting: *Deleted

## 2021-05-14 ENCOUNTER — Other Ambulatory Visit: Payer: Self-pay | Admitting: *Deleted

## 2021-05-14 DIAGNOSIS — S32000A Wedge compression fracture of unspecified lumbar vertebra, initial encounter for closed fracture: Secondary | ICD-10-CM

## 2021-05-20 ENCOUNTER — Other Ambulatory Visit: Payer: Self-pay | Admitting: Neurology

## 2021-05-31 ENCOUNTER — Ambulatory Visit
Admission: RE | Admit: 2021-05-31 | Discharge: 2021-05-31 | Disposition: A | Discharge status: Home / Self Care | Payer: Medicare Other | Source: Ambulatory Visit | Attending: *Deleted | Admitting: *Deleted

## 2021-05-31 ENCOUNTER — Other Ambulatory Visit: Payer: Self-pay

## 2021-05-31 DIAGNOSIS — S32000A Wedge compression fracture of unspecified lumbar vertebra, initial encounter for closed fracture: Secondary | ICD-10-CM

## 2021-06-02 ENCOUNTER — Other Ambulatory Visit: Payer: Self-pay | Admitting: Family Medicine

## 2021-06-02 DIAGNOSIS — E2839 Other primary ovarian failure: Secondary | ICD-10-CM

## 2021-06-29 ENCOUNTER — Ambulatory Visit: Payer: Medicare Other | Admitting: Neurology

## 2021-09-29 ENCOUNTER — Encounter: Payer: Self-pay | Admitting: Neurology

## 2021-09-29 ENCOUNTER — Other Ambulatory Visit: Payer: Self-pay

## 2021-09-29 ENCOUNTER — Ambulatory Visit: Payer: Medicare Other | Admitting: Neurology

## 2021-09-29 VITALS — BP 161/91 | HR 114 | Ht 61.0 in | Wt 166.5 lb

## 2021-09-29 DIAGNOSIS — G3184 Mild cognitive impairment, so stated: Secondary | ICD-10-CM

## 2021-09-29 DIAGNOSIS — F39 Unspecified mood [affective] disorder: Secondary | ICD-10-CM

## 2021-09-29 MED ORDER — DONEPEZIL HCL 10 MG PO TABS: 10.0000 mg | ORAL_TABLET | Freq: Every day | ORAL | 3 refills | Status: DC

## 2021-09-29 NOTE — Progress Notes (Signed)
? ? ?PATIENT: [name] ?DOB: May 21, 1956 ? ?REASON FOR VISIT: Follow up for memory ?HISTORY FROM: Patient, alone ?PRIMARY NEUROLOGIST: Dr. Marjory Lies  ? ?HISTORY OF PRESENT ILLNESS: ?Today 09/29/21 ?[name] here today for follow-up for memory. On Aricept 10 mg daily, needs refill, has been out. Has underlying mental health issues, depression and bipolar disorder. She lives alone. In middle of sentence, loses train of the thought. Is in memory study through [facility], has had blood work done to determine if eligible. Her mom died at 2, she had Alzheimer's , she really worries about this. Tries to mediate, she is Buddhist. Her aunt had Alzheimer's as well. Lives with husband, does her own ADLs, is retired Runner, broadcasting/film/video. Drives okay. Has scoliosis, uses cane, has orthopedic issues. Hasn't given up anything due to memory. Can misplace things (glasses). Is on Abilify, Wellbutrin, Luvox, Lamictal, Seroquel, Trazodone for the mood, sees psychiatry. Just back from [place]. Family business, [employer] company. She keeps her 84 year old granddaughter 1 day a week. ? ?HISTORY  ?03/30/2021 Dr. Anne Hahn: Ms. Torner is a 66 year old right-handed white female with a history of a memory disturbance consistent with mild cognitive impairment.  The patient believes that she has had gradual worsening of her memory over time.  She has a prominent family history of memory difficulties on the mother's side of the family.  She will lose her train of thought while speaking.  She is easily distractible.  She will oftentimes walk into a room and cannot remember why she went in there.  She is independent in all of her activities of daily living, however.  She does suffer some depression that has been worse since the COVID outbreak.  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  She does report chronic insomnia, she will get 4 to 5 hours of sleep at night and has fatigue during the day.  She will not nap during the day.  She cannot  get to sleep until about 2 or 3 in the morning.  She has been on Aricept taking 5 mg daily and has tolerated this.  She has been out of her medications for about a month. ? ?REVIEW OF SYSTEMS: Out of a complete 14 system review of symptoms, the patient complains only of the following symptoms, and all other reviewed systems are negative. ? ?See HPI ? ?ALLERGIES: ?Allergies  ?Allergen Reactions  ? No Known Allergies   ? ? ?HOME MEDICATIONS: ?Outpatient Medications Prior to Visit  ?Medication Sig Dispense Refill  ? amLODipine (NORVASC) 5 MG tablet Take by mouth.    ? ARIPiprazole (ABILIFY) 2 MG tablet TAKE 2 TABLETS BY MOUTH DAILY IN THE MORNING    ? aspirin 81 MG tablet Take 81 mg by mouth daily.      ? buPROPion (WELLBUTRIN XL) 300 MG 24 hr tablet Take 300 mg by mouth daily.      ? carvedilol (COREG) 3.125 MG tablet Take 3.125 mg by mouth 2 (two) times daily with a meal. Taking 2 tablets in the am and 2 in the evening    ? donepezil (ARICEPT) 10 MG tablet Take 1 tablet (10 mg total) by mouth at bedtime. 90 tablet 1  ? fluvoxaMINE (LUVOX) 100 MG tablet Take 300 mg by mouth at bedtime.     ? gabapentin (NEURONTIN) 100 MG capsule Take 200 mg by mouth at bedtime as needed.    ? lamoTRIgine (LAMICTAL) 100 MG tablet Take 200 mg by mouth at bedtime.    ?  levothyroxine (SYNTHROID) 88 MCG tablet Take 1 tablet by mouth every morning.    ? Loperamide HCl (IMODIUM A-D PO) Take by mouth as needed.     ? losartan (COZAAR) 100 MG tablet Take 100 mg by mouth daily.    ? meloxicam (MOBIC) 15 MG tablet TAKE 1 TABLET(15 MG) BY MOUTH IN THE MORNING    ? metFORMIN (GLUCOPHAGE) 500 MG tablet Take 500 mg by mouth. Two in the morning, two at night as per patient.    ? Multiple Vitamin (MULTIVITAMIN) tablet Take 1 tablet by mouth daily.      ? OZEMPIC, 0.25 OR 0.5 MG/DOSE, 2 MG/1.5ML SOPN Inject into the skin once a week. 1.0mg     ? pantoprazole (PROTONIX) 40 MG tablet SMARTSIG:1 Tablet(s) By Mouth Morning-Evening    ? QUEtiapine (SEROQUEL)  300 MG tablet Take 300 mg by mouth 2 (two) times daily.     ? rosuvastatin (CRESTOR) 20 MG tablet Take 20 mg by mouth at bedtime.  11  ? traZODone (DESYREL) 50 MG tablet Take 50-100 mg by mouth at bedtime.    ? ?No facility-administered medications prior to visit.  ? ? ?PAST MEDICAL HISTORY: ?Past Medical History:  ?Diagnosis Date  ? Anemia   ? Anxiety   ? Arthritis   ? Depression   ? GERD (gastroesophageal reflux disease)   ? Heart murmur   ? "related to VSD"  ? High cholesterol   ? History of blood transfusion   ? "related to OR" (08/19/2016)  ? History of hiatal hernia   ? Hypertension   ? Hyperthyroidism   ? Mild cognitive impairment 09/06/2018  ? Paroxysmal ventricular tachycardia   ? Type II diabetes mellitus (HCC)   ? UTI (urinary tract infection)   ? being treated with Keflex  ? Ventricular septal defect   ? ? ?PAST SURGICAL HISTORY: ?Past Surgical History:  ?Procedure Laterality Date  ? ABDOMINAL HYSTERECTOMY    ? BACK SURGERY    ? CARDIAC CATHETERIZATION N/A 06/23/2015  ? Procedure: Left Heart Cath and Coronary Angiography;  Surgeon: Laurey Morale, MD;  Location: [facility] INVASIVE CV LAB;  Service: Cardiovascular;  Laterality: N/A;  ? CARDIAC CATHETERIZATION  1960  ? "VSD was so small; didn't need repaired"  ? EXAM UNDER ANESTHESIA WITH MANIPULATION OF HIP Right 06/02/2014  ? dr Jerl Santos  ? FRACTURE SURGERY    ? HERNIA REPAIR    ? HIP CLOSED REDUCTION Right 06/02/2014  ? Procedure: CLOSED MANIPULATION HIP;  Surgeon: Velna Ochs, MD;  Location: [facility] OR;  Service: Orthopedics;  Laterality: Right;  ? JOINT REPLACEMENT    ? JOINT REPLACEMENT    ? REFRACTIVE SURGERY Bilateral   ? SHOULDER ARTHROSCOPY Right   ? SHOULDER OPEN ROTATOR CUFF REPAIR Right   ? SPINAL FUSION  1996  ? "t10 down to my coccyx  ? SPINE HARDWARE REMOVAL    ? TOTAL ABDOMINAL HYSTERECTOMY    ? TOTAL HIP ARTHROPLASTY Right 05/10/2014  ? [facility]      by dr Cristal Deer olcott  ? TOTAL KNEE ARTHROPLASTY Left   ? TOTAL KNEE ARTHROPLASTY Left   ?  TOTAL SHOULDER ARTHROPLASTY Left 08/19/2016  ? TOTAL SHOULDER ARTHROPLASTY Left 08/19/2016  ? Procedure: TOTAL SHOULDER ARTHROPLASTY;  Surgeon: Jones Broom, MD;  Location: [facility] OR;  Service: Orthopedics;  Laterality: Left;  Left total shoulder replacement  ? ? ?FAMILY HISTORY: ?Family History  ?Problem Relation Age of Onset  ? Other Mother   ?  alive  ? Stroke Father 3  ?     deceased  ? Dementia Father   ? Chorea Maternal Grandfather   ? Dementia Maternal Aunt   ? Dementia Maternal Aunt   ? Heart attack Other   ?     multiple uncles have died with myocardial infarction  ? ? ?SOCIAL HISTORY: ?Social History  ? ?Socioeconomic History  ? Marital status: Married  ?  Spouse name: Not on file  ? Number of children: Not on file  ? Years of education: Not on file  ? Highest education level: Master's degree (e.g., MA, MS, MEng, MEd, MSW, MBA)  ?Occupational History  ? Occupation: Runner, broadcasting/film/video  ?  Employer: SELF-EMPLOYED  ?  Comment: former  ?Tobacco Use  ? Smoking status: Every Day  ?  Years: 50.00  ?  Types: Cigarettes  ? Smokeless tobacco: Never  ? Tobacco comments:  ?  03/30/21 smokes 2 cigs daily  ?Vaping Use  ? Vaping Use: Never used  ?Substance and Sexual Activity  ? Alcohol use: No  ? Drug use: No  ? Sexual activity: Not on file  ?Other Topics Concern  ? Not on file  ?Social History Narrative  ? Right handed  ? Caffeine 2-3 cups daily   ? Lives at home with husband   ? ?Social Determinants of Health  ? ?Financial Resource Strain: Not on file  ?Food Insecurity: Not on file  ?Transportation Needs: Not on file  ?Physical Activity: Not on file  ?Stress: Not on file  ?Social Connections: Not on file  ?Intimate Partner Violence: Not on file  ? ?PHYSICAL EXAM ? ?Vitals:  ? 09/29/21 1528 09/29/21 1529  ?BP: (!) 167/94 (!) 161/91  ?Pulse: (!) 112 (!) 114  ?Weight: 166 lb 8 oz (75.5 kg)   ?Height: 5\' 1"  (1.549 m)   ? ?Body mass index is 31.46 kg/m?. ? ?Generalized: Well developed, in no acute distress  ?MMSE - Mini Mental State  Exam 09/29/2021 03/30/2021 09/06/2018  ?Orientation to time 5 4 5   ?Orientation to Place 5 5 5   ?Registration 3 3 3   ?Attention/ Calculation 5 1 5   ?Recall 3 3 2   ?Language- name 2 objects 2 2 2   ?Languag

## 2021-11-19 ENCOUNTER — Other Ambulatory Visit: Payer: Medicare Other

## 2021-12-10 ENCOUNTER — Other Ambulatory Visit: Payer: Medicare Other

## 2021-12-29 ENCOUNTER — Other Ambulatory Visit: Payer: Self-pay | Admitting: Family Medicine

## 2021-12-29 DIAGNOSIS — E2839 Other primary ovarian failure: Secondary | ICD-10-CM

## 2022-01-01 ENCOUNTER — Other Ambulatory Visit: Payer: Self-pay | Admitting: Family Medicine

## 2022-01-01 DIAGNOSIS — Z1231 Encounter for screening mammogram for malignant neoplasm of breast: Secondary | ICD-10-CM

## 2022-01-12 ENCOUNTER — Telehealth: Payer: Self-pay | Admitting: Cardiovascular Disease

## 2022-01-14 NOTE — Telephone Encounter (Signed)
Returned call to [employer] straight to [employer]. Left message for her to call back if needed.

## 2022-01-22 NOTE — Telephone Encounter (Signed)
Returned call to [place], again no answer and straight to voicemail. Left another detailed message. Patient last seen 06/23/20 by Norma Fredrickson, NP: Disposition:   FU with Dr. Excell Seltzer in 6 months with an echo prior. Overall, she is felt to be doing ok. She is aware that I am leaving in February.  Patient is agreeable to this plan and will call if any problems develop in the interim. Patient was scheduled for ECHO on 11/25/20 and was NO SHOW. 3 attempts were made to reschedule patient and all were unsuccessful. Pt was scheduled again for ECHO on 01/06/21 and was again a NO SHOW. 3 additional attempts made to reschedule which were unsuccessful. Order expired. Patient was also scheduled to see Dr Excell Seltzer on 12/24/20 (even though she hadn't had her ECHO done) and pt was a NO SHOW for this appointment.   Called patient. She reluctantly admits that she has hesitated for the last "few years" to get the ECHO or be seen because she wasn't interested in starting over with another cardiologist. Pt states she used to see Dr Riley Kill and loved him, then saw Marca Ancona for a short time, then was passed to [place] who she followed with for a few years, and was just not interested in seeing another doctor. I advised her that it's completely up to her,but to please expain this to Dr Reade's office who's calling us. Patient then states she has asked around "for who's good to see, and several people I know have seen and liked Dr Anne Fu." Patient asking if she can get an appt with Dr Anne Fu, then decide on the ECHO afterwards. Advised pt that ordinarily we would do a provider switch, but since she never saw Dr Excell Seltzer and Tyrone Sage is no longer here, it will be up to [facility]. I will route message to him to review and decide if he wants to take her on.

## 2022-01-22 NOTE — Telephone Encounter (Signed)
Caller stated she believes the patient is due for an Echo.  Dr. Nicholos Johns wants to know what are the follow-up plans for this patient.

## 2022-01-25 NOTE — Telephone Encounter (Signed)
Jake Bathe, MD  You 3 hours ago (12:42 PM)   I would be happy to see her.   Donato Schultz, MD      Called patient and got her scheduled for 02/10/22 at 0900.

## 2022-01-27 ENCOUNTER — Other Ambulatory Visit: Payer: Self-pay | Admitting: Gastroenterology

## 2022-01-27 DIAGNOSIS — R1084 Generalized abdominal pain: Secondary | ICD-10-CM

## 2022-01-27 DIAGNOSIS — R634 Abnormal weight loss: Secondary | ICD-10-CM

## 2022-02-01 ENCOUNTER — Ambulatory Visit
Admission: RE | Admit: 2022-02-01 | Discharge: 2022-02-01 | Disposition: A | Discharge status: Home / Self Care | Payer: Medicare Other | Source: Ambulatory Visit | Attending: Family Medicine | Admitting: Family Medicine

## 2022-02-01 DIAGNOSIS — Z1231 Encounter for screening mammogram for malignant neoplasm of breast: Secondary | ICD-10-CM

## 2022-02-03 ENCOUNTER — Ambulatory Visit
Admission: RE | Admit: 2022-02-03 | Discharge: 2022-02-03 | Disposition: A | Discharge status: Home / Self Care | Payer: Medicare Other | Source: Ambulatory Visit | Attending: Gastroenterology | Admitting: Gastroenterology

## 2022-02-03 DIAGNOSIS — R1084 Generalized abdominal pain: Secondary | ICD-10-CM

## 2022-02-03 DIAGNOSIS — R634 Abnormal weight loss: Secondary | ICD-10-CM

## 2022-02-10 ENCOUNTER — Telehealth: Payer: Self-pay | Admitting: Cardiology

## 2022-02-10 ENCOUNTER — Ambulatory Visit: Payer: Medicare Other | Admitting: Cardiology

## 2022-02-10 ENCOUNTER — Encounter: Payer: Self-pay | Admitting: Cardiology

## 2022-02-10 VITALS — BP 120/70 | HR 86 | Ht 61.0 in | Wt 166.0 lb

## 2022-02-10 DIAGNOSIS — I428 Other cardiomyopathies: Secondary | ICD-10-CM | POA: Diagnosis not present

## 2022-02-10 DIAGNOSIS — Q210 Ventricular septal defect: Secondary | ICD-10-CM | POA: Diagnosis not present

## 2022-02-10 NOTE — Patient Instructions (Signed)
Medication Instructions:  Your physician recommends that you continue on your current medications as directed. Please refer to the Current Medication list given to you today.  *If you need a refill on your cardiac medications before your next appointment, please call your pharmacy*  Testing/Procedures: Echo Your physician has requested that you have an echocardiogram. Echocardiography is a painless test that uses sound waves to create images of your heart. It provides your doctor with information about the size and shape of your heart and how well your heart's chambers and valves are working. This procedure takes approximately one hour. There are no restrictions for this procedure.    Follow-Up: At [facility], you and your health needs are our priority.  As part of our continuing mission to provide you with exceptional heart care, we have created designated Provider Care Teams.  These Care Teams include your primary Cardiologist (physician) and Advanced Practice Providers (APPs -  Physician Assistants and Nurse Practitioners) who all work together to provide you with the care you need, when you need it.   Your next appointment:   1 year(s)  The format for your next appointment:   In Person  Provider:   Tonny Bollman, MD

## 2022-02-10 NOTE — Telephone Encounter (Signed)
Pt called to schedule echo, she scheduled for 02/23/22 and 3:05pm.  She also wanted to know why her avs said Dr. Excell Seltzer and not Dr. Anne Fu. I informed her that since her first time seeing Dr. Anne Fu was today that it probably hasnt changed changed in the system yet. She would like to know if someone can do that.

## 2022-02-10 NOTE — Progress Notes (Signed)
Cardiology Office Note:    Date:  02/10/2022   ID:  [name], DOB Dec 06, 1955, MRN [id number]  PCP:  Elias Else, MD   [facility] Providers Cardiologist:  Tonny Bollman, MD     Referring MD: Elias Else, MD    History of Present Illness:    [name] is a 66 y.o. female here for follow-up of small membranous VSD, cardiomyopathy with EF improved on most recent echo prior 45 to 50%, currently 71.  Last seen by [name] in 2021. Has been battling orthopedic issues.  Bipolar depression smoked in the past 2 cigarettes/day, 1 in the morning, 1 in the evening.  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  She has a cabin outside of [facility].  It is on the [facility].  Enjoys the serenity.  In the past saw Dr. Shirlee Latch, Dr. Riley Kill  At age 29 years old she had a heart catheterization, right brachial cutdown.  She showed me the scar.  She remembers the surgeon carrying her at 66 years old on rounds.  She says it was actually a positive experience.   Past Medical History:  Diagnosis Date   Anemia    Anxiety    Arthritis    Depression    GERD (gastroesophageal reflux disease)    Heart murmur    "related to VSD"   High cholesterol    History of blood transfusion    "related to OR" (08/19/2016)   History of hiatal hernia    Hypertension    Hyperthyroidism    Mild cognitive impairment 09/06/2018   Paroxysmal ventricular tachycardia (HCC)    Type II diabetes mellitus (HCC)    UTI (urinary tract infection)    being treated with Keflex   Ventricular septal defect     Past Surgical History:  Procedure Laterality Date   ABDOMINAL HYSTERECTOMY     BACK SURGERY     CARDIAC CATHETERIZATION N/A 06/23/2015   Procedure: Left Heart Cath and Coronary Angiography;  Surgeon: Laurey Morale, MD;  Location: [facility] INVASIVE CV LAB;  Service: Cardiovascular;  Laterality: N/A;   CARDIAC CATHETERIZATION  1960   "VSD was so  small; didn't need repaired"   EXAM UNDER ANESTHESIA WITH MANIPULATION OF HIP Right 06/02/2014   dr Jerl Santos   FRACTURE SURGERY     HERNIA REPAIR     HIP CLOSED REDUCTION Right 06/02/2014   Procedure: CLOSED MANIPULATION HIP;  Surgeon: Velna Ochs, MD;  Location: [facility] OR;  Service: Orthopedics;  Laterality: Right;   JOINT REPLACEMENT     JOINT REPLACEMENT     REFRACTIVE SURGERY Bilateral    SHOULDER ARTHROSCOPY Right    SHOULDER OPEN ROTATOR CUFF REPAIR Right    SPINAL FUSION  1996   "t10 down to my coccyx   SPINE HARDWARE REMOVAL     TOTAL ABDOMINAL HYSTERECTOMY     TOTAL HIP ARTHROPLASTY Right 05/10/2014   [place]      by dr Cristal Deer olcott   TOTAL KNEE ARTHROPLASTY Left    TOTAL KNEE ARTHROPLASTY Left    TOTAL SHOULDER ARTHROPLASTY Left 08/19/2016   TOTAL SHOULDER ARTHROPLASTY Left 08/19/2016   Procedure: TOTAL SHOULDER ARTHROPLASTY;  Surgeon: Jones Broom, MD;  Location: [facility] OR;  Service: Orthopedics;  Laterality: Left;  Left total shoulder replacement    Current Medications: Current Meds  Medication Sig   amLODipine (NORVASC) 5 MG tablet Take by mouth.   ARIPiprazole (ABILIFY) 2  MG tablet TAKE 2 TABLETS BY MOUTH DAILY IN THE MORNING   aspirin 81 MG tablet Take 81 mg by mouth daily.     buPROPion (WELLBUTRIN XL) 300 MG 24 hr tablet Take 300 mg by mouth daily.     carvedilol (COREG) 3.125 MG tablet Take 3.125 mg by mouth 2 (two) times daily with a meal. Taking 2 tablets in the am and 2 in the evening   donepezil (ARICEPT) 10 MG tablet Take 1 tablet (10 mg total) by mouth at bedtime.   fluvoxaMINE (LUVOX) 100 MG tablet Take 300 mg by mouth at bedtime.    lamoTRIgine (LAMICTAL) 100 MG tablet Take 200 mg by mouth at bedtime.   levothyroxine (SYNTHROID) 88 MCG tablet Take 1 tablet by mouth every morning.   Loperamide HCl (IMODIUM A-D PO) Take by mouth as needed.    losartan (COZAAR) 100 MG tablet Take 100 mg by mouth daily.   meloxicam (MOBIC) 15 MG tablet TAKE 1  TABLET(15 MG) BY MOUTH IN THE MORNING   metFORMIN (GLUCOPHAGE) 500 MG tablet Take 500 mg by mouth. Two in the morning, two at night as per patient.   Multiple Vitamin (MULTIVITAMIN) tablet Take 1 tablet by mouth daily.     OZEMPIC, 0.25 OR 0.5 MG/DOSE, 2 MG/1.5ML SOPN Inject into the skin once a week. 1.0mg    pantoprazole (PROTONIX) 40 MG tablet SMARTSIG:1 Tablet(s) By Mouth Morning-Evening   QUEtiapine (SEROQUEL) 300 MG tablet Take 300 mg by mouth 2 (two) times daily.    rosuvastatin (CRESTOR) 20 MG tablet Take 20 mg by mouth at bedtime.   traZODone (DESYREL) 50 MG tablet Take 50-100 mg by mouth at bedtime.     Allergies:   No known allergies   Social History   Socioeconomic History   Marital status: Married    Spouse name: Not on file   Number of children: Not on file   Years of education: Not on file   Highest education level: Master's degree (e.g., MA, MS, MEng, MEd, MSW, MBA)  Occupational History   Occupation: Magazine features editor: SELF-EMPLOYED    Comment: former  Tobacco Use   Smoking status: Every Day    Years: 50.00    Types: Cigarettes   Smokeless tobacco: Never   Tobacco comments:    03/30/21 smokes 2 cigs daily  Vaping Use   Vaping Use: Never used  Substance and Sexual Activity   Alcohol use: No   Drug use: No   Sexual activity: Not on file  Other Topics Concern   Not on file  Social History Narrative   Right handed   Caffeine 2-3 cups daily    Lives at home with husband    Social Determinants of Health   Financial Resource Strain: Not on file  Food Insecurity: Not on file  Transportation Needs: Not on file  Physical Activity: Not on file  Stress: Not on file  Social Connections: Not on file     Family History: The patient's family history includes Chorea in her maternal grandfather; Dementia in her father, maternal aunt, and maternal aunt; Heart attack in an other family member; Other in her mother; Stroke (age of onset: 67) in her father.  ROS:    Please see the history of present illness.     All other systems reviewed and are negative.  EKGs/Labs/Other Studies Reviewed:    The following studies were reviewed today: ECHO 2020:   1. Left ventricular ejection fraction, by visual estimation, is  45 to  50%. The left ventricle has mildly decreased function. Mildly increased  left ventricular size. There is no left ventricular hypertrophy.   2. Elevated mean left atrial pressure.   3. Left ventricular diastolic Doppler parameters are consistent with  impaired relaxation pattern of LV diastolic filling.   4. Global right ventricle has normal systolic function.The right  ventricular size is normal.   5. Left atrial size was normal.   6. Right atrial size was normal.   7. Mild mitral annular calcification.   8. The mitral valve is normal in structure. Trace mitral valve  regurgitation. No evidence of mitral stenosis.   9. The tricuspid valve is normal in structure. Tricuspid valve  regurgitation is mild.  10. The aortic valve is tricuspid Aortic valve regurgitation is trivial by  color flow Doppler. Mild aortic valve sclerosis without stenosis.  11. The pulmonic valve was normal in structure. Pulmonic valve  regurgitation is not visualized by color flow Doppler.  12. Normal pulmonary artery systolic pressure.  13. The inferior vena cava is normal in size with greater than 50%  respiratory variability, suggesting right atrial pressure of 3 mmHg.  14. Septal dyssynergy with overall mildly reduced LV systolic function;  grade 1 diastolic dysfunction; mild LVE; trace AI; mild TR; small  perimembranous VSD.  15. Small membranous ventricular septal defect with.   Cardiac catheterization 2016-nonobstructive disease  EKG:  EKG is  ordered today.  The ekg ordered today demonstrates sinus rhythm 86 left anterior fascicular block septal infarct pattern  Recent Labs: No results found for requested labs within last 365 days.  Recent  Lipid Panel    Component Value Date/Time   CHOL 97 (L) 10/17/2017 1538   TRIG 115 10/17/2017 1538   HDL 47 10/17/2017 1538   CHOLHDL 2.1 10/17/2017 1538   CHOLHDL 4 12/25/2014 1432   VLDL 40.8 (H) 12/25/2014 1432   LDLCALC 27 10/17/2017 1538   LDLDIRECT 73.0 12/25/2014 1432     Risk Assessment/Calculations:              Physical Exam:    VS:  BP 120/70 (BP Location: Left Arm, Patient Position: Sitting, Cuff Size: Normal)   Pulse 86   Ht 5\' 1"  (1.549 m)   Wt 166 lb (75.3 kg)   BMI 31.37 kg/m     Wt Readings from Last 3 Encounters:  02/10/22 166 lb (75.3 kg)  09/29/21 166 lb 8 oz (75.5 kg)  03/30/21 172 lb (78 kg)     GEN: Ambulates with a cane well nourished, well developed in no acute distress HEENT: Normal NECK: No JVD; No carotid bruits LYMPHATICS: No lymphadenopathy CARDIAC: RRR, no murmurs, no rubs, gallops RESPIRATORY:  Clear to auscultation without rales, wheezing or rhonchi  ABDOMEN: Soft, non-tender, non-distended MUSCULOSKELETAL:  No edema; No deformity  SKIN: Warm and dry NEUROLOGIC:  Alert and oriented x 3 PSYCHIATRIC:  Normal affect   ASSESSMENT:    1. VSD (ventricular septal defect)   2. NICM (nonischemic cardiomyopathy) (HCC)    PLAN:    In order of problems listed above:  Perimembranous VSD - Checking echocardiogram.  Has been small.  No RV enlargement.  Ventricular tachycardia ablation - No recurrence no palpitations.  Doing well.  Continue with beta-blocker.  Low-dose carvedilol.  Nonobstructive coronary artery disease - Cardiac catheterization 2016.  Medical management.  Doing well.  Nonischemic cardiomyopathy - Ejection fraction has improved, 45% - In the past has not tolerated higher doses of carvedilol due  to worsening fatigue. - Continue with current regimen.  Tobacco use - Cessation encouraged.  Primary hypertension - Tends to get dizzy if lower blood pressures noted.  Bipolar disorder - Per psychiatry, Dr. Betti Cruz.   Stable  Diabetes - Per primary      Medication Adjustments/Labs and Tests Ordered: Current medicines are reviewed at length with the patient today.  Concerns regarding medicines are outlined above.  Orders Placed This Encounter  Procedures   EKG 12-Lead   ECHOCARDIOGRAM COMPLETE   No orders of the defined types were placed in this encounter.   Patient Instructions  Medication Instructions:  Your physician recommends that you continue on your current medications as directed. Please refer to the Current Medication list given to you today.  *If you need a refill on your cardiac medications before your next appointment, please call your pharmacy*  Testing/Procedures: Echo Your physician has requested that you have an echocardiogram. Echocardiography is a painless test that uses sound waves to create images of your heart. It provides your doctor with information about the size and shape of your heart and how well your heart's chambers and valves are working. This procedure takes approximately one hour. There are no restrictions for this procedure.    Follow-Up: At [facility], you and your health needs are our priority.  As part of our continuing mission to provide you with exceptional heart care, we have created designated Provider Care Teams.  These Care Teams include your primary Cardiologist (physician) and Advanced Practice Providers (APPs -  Physician Assistants and Nurse Practitioners) who all work together to provide you with the care you need, when you need it.   Your next appointment:   1 year(s)  The format for your next appointment:   In Person  Provider:   Tonny Bollman, MD               Signed, Donato Schultz, MD  02/10/2022 10:15 AM    [facility]

## 2022-02-10 NOTE — Telephone Encounter (Signed)
Spoke with the patient and advised her that this was an accident and her primary cardiologist is Dr. Anne Fu and this has been changed in the system.

## 2022-02-22 ENCOUNTER — Telehealth (HOSPITAL_COMMUNITY): Payer: Self-pay | Admitting: Cardiology

## 2022-02-22 NOTE — Telephone Encounter (Signed)
Patient cancelled echocardiogram. She did not wish to reschedule at this time. Order will be removed from the echo WQ and if patient calls back to reschedule we will reinstate the order. Thank you.

## 2022-02-23 ENCOUNTER — Other Ambulatory Visit (HOSPITAL_COMMUNITY): Payer: Medicare Other

## 2022-04-07 ENCOUNTER — Ambulatory Visit: Payer: Medicare Other | Admitting: Neurology

## 2022-04-14 NOTE — Progress Notes (Unsigned)
PATIENT: [name] DOB: Jun 29, 1956  REASON FOR VISIT: Follow up for memory HISTORY FROM: Patient, alone PRIMARY NEUROLOGIST: Dr. Leta Baptist   HISTORY OF PRESENT ILLNESS: Today 04/15/22 [name] is here today for follow-up. MMSE 25/30, but she refused the calculation portion. Was enrolled in memory trial, claims was disqualified due to not to having the "marker", told she doesn't have Alzheimer's. Feels memory is good, is not concerned given her age. Is working psych NP to reduce medications that could cause foggy. Is still on Aricept. Has fatigue, doesn't sleep well. Lives with her husband. Still keeps her granddaughter one day a week. Does well with PT.   09/29/21 SS: [name] here today for follow-up for memory. On Aricept 10 mg daily, needs refill, has been out. Has underlying mental health issues, depression and bipolar disorder. She lives alone. In middle of sentence, loses train of the thought. Is in memory study through [facility], has had blood work done to determine if eligible. Her mom died at 67, she had Alzheimer's , she really worries about this. Tries to mediate, she is Buddhist. Her aunt had Alzheimer's as well. Lives with husband, does her own ADLs, is retired Pharmacist, hospital. Drives okay. Has scoliosis, uses cane, has orthopedic issues. Hasn't given up anything due to memory. Can misplace things (glasses). Is on Abilify, Wellbutrin, Luvox, Lamictal, Seroquel, Trazodone for the mood, sees psychiatry. Just back from [place]. 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 She keeps her 52 year old granddaughter 1 day a week.  HISTORY  03/30/2021 Dr. Jannifer Franklin: [name] is a 66 year old right-handed white female with a history of a memory disturbance consistent with mild cognitive impairment.  The patient believes that she has had gradual worsening of her memory over time.  She has a prominent family history of memory difficulties on the mother's side of the family.  She will lose her train  of thought while speaking.  She is easily distractible.  She will oftentimes walk into a room and cannot remember why she went in there.  She is independent in all of her activities of daily living, however.  She does suffer some depression that has been worse since the COVID outbreak.  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  She does report chronic insomnia, she will get 4 to 5 hours of sleep at night and has fatigue during the day.  She will not nap during the day.  She cannot get to sleep until about 2 or 3 in the morning.  She has been on Aricept taking 5 mg daily and has tolerated this.  She has been out of her medications for about a month.  REVIEW OF SYSTEMS: Out of a complete 14 system review of symptoms, the patient complains only of the following symptoms, and all other reviewed systems are negative.  See HPI  ALLERGIES: Allergies  Allergen Reactions   No Known Allergies     HOME MEDICATIONS: Outpatient Medications Prior to Visit  Medication Sig Dispense Refill   amLODipine (NORVASC) 5 MG tablet Take by mouth.     ARIPiprazole (ABILIFY) 2 MG tablet TAKE 2 TABLETS BY MOUTH DAILY IN THE MORNING     aspirin 81 MG tablet Take 81 mg by mouth daily.       buPROPion (WELLBUTRIN XL) 300 MG 24 hr tablet Take 300 mg by mouth daily.       carvedilol (COREG) 3.125 MG tablet Take 3.125 mg by mouth 2 (two) times daily  with a meal. Taking 2 tablets in the am and 2 in the evening     donepezil (ARICEPT) 10 MG tablet Take 1 tablet (10 mg total) by mouth at bedtime. 90 tablet 3   fluvoxaMINE (LUVOX) 100 MG tablet Take 200 mg by mouth at bedtime.     lamoTRIgine (LAMICTAL) 100 MG tablet Take 200 mg by mouth at bedtime.     levothyroxine (SYNTHROID) 88 MCG tablet Take 1 tablet by mouth every morning.     Loperamide HCl (IMODIUM A-D PO) Take by mouth as needed.      losartan (COZAAR) 100 MG tablet Take 100 mg by mouth daily.     meloxicam (MOBIC) 15 MG tablet TAKE 1 TABLET(15 MG)  BY MOUTH IN THE MORNING     metFORMIN (GLUCOPHAGE) 500 MG tablet Take 500 mg by mouth daily.     Multiple Vitamin (MULTIVITAMIN) tablet Take 1 tablet by mouth daily.       OZEMPIC, 0.25 OR 0.5 MG/DOSE, 2 MG/1.5ML SOPN Inject into the skin once a week. 1.0mg      pantoprazole (PROTONIX) 40 MG tablet SMARTSIG:1 Tablet(s) By Mouth Morning-Evening     QUEtiapine (SEROQUEL) 300 MG tablet Take 300 mg by mouth daily.     rosuvastatin (CRESTOR) 20 MG tablet Take 20 mg by mouth at bedtime.  11   traZODone (DESYREL) 50 MG tablet Take 50-100 mg by mouth at bedtime.     No facility-administered medications prior to visit.    PAST MEDICAL HISTORY: Past Medical History:  Diagnosis Date   Anemia    Anxiety    Arthritis    Depression    GERD (gastroesophageal reflux disease)    Heart murmur    "related to VSD"   High cholesterol    History of blood transfusion    "related to OR" (08/19/2016)   History of hiatal hernia    Hypertension    Hyperthyroidism    Mild cognitive impairment 09/06/2018   Paroxysmal ventricular tachycardia (HCC)    Type II diabetes mellitus (HCC)    UTI (urinary tract infection)    being treated with Keflex   Ventricular septal defect     PAST SURGICAL HISTORY: Past Surgical History:  Procedure Laterality Date   ABDOMINAL HYSTERECTOMY     BACK SURGERY     CARDIAC CATHETERIZATION N/A 06/23/2015   Procedure: Left Heart Cath and Coronary Angiography;  Surgeon: Laurey Morale, MD;  Location: [facility] INVASIVE CV LAB;  Service: Cardiovascular;  Laterality: N/A;   CARDIAC CATHETERIZATION  1960   "VSD was so small; didn't need repaired"   EXAM UNDER ANESTHESIA WITH MANIPULATION OF HIP Right 06/02/2014   dr Jerl Santos   FRACTURE SURGERY     HERNIA REPAIR     HIP CLOSED REDUCTION Right 06/02/2014   Procedure: CLOSED MANIPULATION HIP;  Surgeon: Velna Ochs, MD;  Location: [facility] OR;  Service: Orthopedics;  Laterality: Right;   JOINT REPLACEMENT     JOINT REPLACEMENT      REFRACTIVE SURGERY Bilateral    SHOULDER ARTHROSCOPY Right    SHOULDER OPEN ROTATOR CUFF REPAIR Right    SPINAL FUSION  1996   "t10 down to my coccyx   SPINE HARDWARE REMOVAL     TOTAL ABDOMINAL HYSTERECTOMY     TOTAL HIP ARTHROPLASTY Right 05/10/2014   [place]      by dr Cristal Deer olcott   TOTAL KNEE ARTHROPLASTY Left    TOTAL KNEE ARTHROPLASTY Left    TOTAL SHOULDER ARTHROPLASTY Left  08/19/2016   TOTAL SHOULDER ARTHROPLASTY Left 08/19/2016   Procedure: TOTAL SHOULDER ARTHROPLASTY;  Surgeon: Jones Broom, MD;  Location: [facility] OR;  Service: Orthopedics;  Laterality: Left;  Left total shoulder replacement    FAMILY HISTORY: Family History  Problem Relation Age of Onset   Other Mother        alive   Stroke Father 33       deceased   Dementia Father    Chorea Maternal Grandfather    Dementia Maternal Aunt    Dementia Maternal Aunt    Heart attack Other        multiple uncles have died with myocardial infarction    SOCIAL HISTORY: Social History   Socioeconomic History   Marital status: Married    Spouse name: Not on file   Number of children: Not on file   Years of education: Not on file   Highest education level: Master's degree (e.g., MA, MS, MEng, MEd, MSW, MBA)  Occupational History   Occupation: Magazine features editor: SELF-EMPLOYED    Comment: former  Tobacco Use   Smoking status: Every Day    Years: 50.00    Types: Cigarettes   Smokeless tobacco: Never   Tobacco comments:    03/30/21 smokes 2 cigs daily  Vaping Use   Vaping Use: Never used  Substance and Sexual Activity   Alcohol use: No   Drug use: No   Sexual activity: Not on file  Other Topics Concern   Not on file  Social History Narrative   Right handed   Caffeine 2-3 cups daily    Lives at home with husband    Social Determinants of Health   Financial Resource Strain: Not on file  Food Insecurity: Not on file  Transportation Needs: Not on file  Physical Activity: Not on file  Stress: Not  on file  Social Connections: Not on file  Intimate Partner Violence: Not on file   PHYSICAL EXAM  Vitals:   04/15/22 1022  BP: (!) 158/81  Pulse: 83  Weight: 166 lb 8 oz (75.5 kg)  Height: 5\' 1"  (1.549 m)    Body mass index is 31.46 kg/m.  Generalized: Well developed, in no acute distress     04/15/2022   10:24 AM 09/29/2021    3:00 PM 03/30/2021    3:15 PM  MMSE - Mini Mental State Exam  Orientation to time 5 5 4   Orientation to Place 5 5 5   Registration 3 3 3   Attention/ Calculation 0 5 1  Attention/Calculation-comments pt refused    Recall 3 3 3   Language- name 2 objects 2 2 2   Language- repeat 1 1 1   Language- follow 3 step command 3 3 3   Language- read & follow direction 1 1 1   Write a sentence 1 1 1   Copy design 1 1 1   Total score 25 30 25    Neurological examination  Mentation: Alert oriented to time, place, history taking. Follows all commands speech and language fluent Cranial nerve II-XII: Pupils were equal round reactive to light. Extraocular movements were full, visual field were full on confrontational test. Facial sensation and strength were normal. Head turning and shoulder shrug  were normal and symmetric. Motor: Upper extremities are normal, right leg 4/5 hip flexion Sensory: Sensory testing is intact to soft touch on all 4 extremities. No evidence of extinction is noted.  Coordination: Cerebellar testing reveals good finger-nose-finger bilaterally, heel to shin with left is good, can't do with  the right Gait and station: Gait is wide based, uses a cane, limp on the right Reflexes: Deep tendon reflexes are symmetric and normal bilaterally.   DIAGNOSTIC DATA (LABS, IMAGING, TESTING) - I reviewed patient records, labs, notes, testing and imaging myself where available.  Lab Results  Component Value Date   WBC 11.3 (H) 02/28/2018   HGB 13.2 02/28/2018   HCT 39.5 02/28/2018   MCV 95.4 02/28/2018   PLT 326 02/28/2018      Component Value Date/Time    NA 134 (L) 02/28/2018 1022   NA 142 11/04/2017 1425   K 3.9 02/28/2018 1022   CL 102 02/28/2018 1022   CO2 23 02/28/2018 1022   GLUCOSE 184 (H) 02/28/2018 1022   BUN 6 (L) 02/28/2018 1022   BUN 8 11/04/2017 1425   CREATININE 0.71 02/28/2018 1022   CREATININE 0.75 06/18/2015 1231   CALCIUM 8.6 (L) 02/28/2018 1022   PROT 6.7 02/28/2018 0150   PROT 6.8 11/04/2017 1425   ALBUMIN 3.8 02/28/2018 0150   ALBUMIN 4.5 11/04/2017 1425   AST 32 02/28/2018 0150   ALT 27 02/28/2018 0150   ALKPHOS 116 02/28/2018 0150   BILITOT 0.5 02/28/2018 0150   BILITOT <0.2 11/04/2017 1425   GFRNONAA >60 02/28/2018 1022   GFRAA >60 02/28/2018 1022   Lab Results  Component Value Date   CHOL 97 (L) 10/17/2017   HDL 47 10/17/2017   LDLCALC 27 10/17/2017   LDLDIRECT 73.0 12/25/2014   TRIG 115 10/17/2017   CHOLHDL 2.1 10/17/2017   Lab Results  Component Value Date   HGBA1C 6.5 (H) 08/17/2016   Lab Results  Component Value Date   VITAMINB12 559 09/06/2018   Lab Results  Component Value Date   TSH 1.636 02/28/2018    ASSESSMENT AND PLAN 66 y.o. year old female   Mild cognitive impairment Mood disorder Polypharmacy -Reportedly was disqualified from memory research trial for not having a marker for Alzheimer's -We will simplify her medication regimen, will stop Aricept, monitor for any worsening memory -Will work with psychiatry to determine if any other mood related medications eliminated (Abilify, Wellbutrin, Luvox, Lamictal, Seroquel, trazodone) -Will return here on an as-needed basis  Otila Kluver, DNP 04/15/2022, 10:35 AM [name] [address], [address] [place], [place] [address] [phone]

## 2022-04-15 ENCOUNTER — Encounter: Payer: Self-pay | Admitting: Neurology

## 2022-04-15 ENCOUNTER — Ambulatory Visit: Payer: Medicare Other | Admitting: Neurology

## 2022-04-15 VITALS — BP 158/81 | HR 83 | Ht 61.0 in | Wt 166.5 lb

## 2022-04-15 DIAGNOSIS — G3184 Mild cognitive impairment, so stated: Secondary | ICD-10-CM

## 2022-04-15 NOTE — Patient Instructions (Signed)
Start taking Aricept 5 mg daily for 1 week, then stop, monitor for any worsening memory issues Discuss other medication management with your psychiatry NP  If you can get access to your labs from your research involvement I would be happy to review Return here if needed

## 2022-05-25 ENCOUNTER — Other Ambulatory Visit: Payer: Medicare Other

## 2022-06-24 ENCOUNTER — Other Ambulatory Visit (HOSPITAL_COMMUNITY): Payer: Self-pay | Admitting: Orthopaedic Surgery

## 2022-06-24 DIAGNOSIS — M545 Low back pain, unspecified: Secondary | ICD-10-CM

## 2022-06-25 ENCOUNTER — Emergency Department (HOSPITAL_COMMUNITY): Payer: Medicare Other

## 2022-06-25 ENCOUNTER — Ambulatory Visit: Payer: Self-pay | Admitting: *Deleted

## 2022-06-25 ENCOUNTER — Inpatient Hospital Stay (HOSPITAL_COMMUNITY)
Admission: EM | Admit: 2022-06-25 | Discharge: 2022-07-08 | DRG: 460 | Disposition: A | Discharge status: Skilled Nursing Facility | Payer: Medicare Other | Attending: Neurological Surgery | Admitting: Neurological Surgery

## 2022-06-25 ENCOUNTER — Encounter (HOSPITAL_COMMUNITY): Payer: Self-pay

## 2022-06-25 ENCOUNTER — Other Ambulatory Visit: Payer: Self-pay

## 2022-06-25 DIAGNOSIS — Z7401 Bed confinement status: Secondary | ICD-10-CM | POA: Diagnosis not present

## 2022-06-25 DIAGNOSIS — F17210 Nicotine dependence, cigarettes, uncomplicated: Secondary | ICD-10-CM | POA: Diagnosis present

## 2022-06-25 DIAGNOSIS — M419 Scoliosis, unspecified: Secondary | ICD-10-CM | POA: Diagnosis present

## 2022-06-25 DIAGNOSIS — F32A Depression, unspecified: Secondary | ICD-10-CM | POA: Diagnosis not present

## 2022-06-25 DIAGNOSIS — Z981 Arthrodesis status: Secondary | ICD-10-CM | POA: Diagnosis not present

## 2022-06-25 DIAGNOSIS — I10 Essential (primary) hypertension: Secondary | ICD-10-CM | POA: Diagnosis not present

## 2022-06-25 DIAGNOSIS — S32031A Stable burst fracture of third lumbar vertebra, initial encounter for closed fracture: Principal | ICD-10-CM | POA: Diagnosis present

## 2022-06-25 DIAGNOSIS — Z79890 Hormone replacement therapy (postmenopausal): Secondary | ICD-10-CM

## 2022-06-25 DIAGNOSIS — S32009A Unspecified fracture of unspecified lumbar vertebra, initial encounter for closed fracture: Secondary | ICD-10-CM | POA: Diagnosis present

## 2022-06-25 DIAGNOSIS — W1830XA Fall on same level, unspecified, initial encounter: Secondary | ICD-10-CM | POA: Diagnosis not present

## 2022-06-25 DIAGNOSIS — Z9181 History of falling: Secondary | ICD-10-CM

## 2022-06-25 DIAGNOSIS — Z7984 Long term (current) use of oral hypoglycemic drugs: Secondary | ICD-10-CM | POA: Diagnosis not present

## 2022-06-25 DIAGNOSIS — E119 Type 2 diabetes mellitus without complications: Secondary | ICD-10-CM | POA: Diagnosis not present

## 2022-06-25 DIAGNOSIS — K219 Gastro-esophageal reflux disease without esophagitis: Secondary | ICD-10-CM | POA: Diagnosis not present

## 2022-06-25 DIAGNOSIS — G3184 Mild cognitive impairment, so stated: Secondary | ICD-10-CM | POA: Diagnosis present

## 2022-06-25 DIAGNOSIS — Z79891 Long term (current) use of opiate analgesic: Secondary | ICD-10-CM

## 2022-06-25 DIAGNOSIS — Z7985 Long-term (current) use of injectable non-insulin antidiabetic drugs: Secondary | ICD-10-CM

## 2022-06-25 DIAGNOSIS — E7800 Pure hypercholesterolemia, unspecified: Secondary | ICD-10-CM | POA: Diagnosis present

## 2022-06-25 DIAGNOSIS — M1990 Unspecified osteoarthritis, unspecified site: Secondary | ICD-10-CM | POA: Diagnosis present

## 2022-06-25 DIAGNOSIS — F419 Anxiety disorder, unspecified: Secondary | ICD-10-CM | POA: Diagnosis not present

## 2022-06-25 DIAGNOSIS — Z79899 Other long term (current) drug therapy: Secondary | ICD-10-CM

## 2022-06-25 DIAGNOSIS — S32038A Other fracture of third lumbar vertebra, initial encounter for closed fracture: Principal | ICD-10-CM

## 2022-06-25 DIAGNOSIS — Z8249 Family history of ischemic heart disease and other diseases of the circulatory system: Secondary | ICD-10-CM

## 2022-06-25 LAB — CBC WITH DIFFERENTIAL/PLATELET
Abs Immature Granulocytes: 0.07 10*3/uL (ref 0.00–0.07)
Basophils Absolute: 0.1 10*3/uL (ref 0.0–0.1)
Basophils Relative: 1 %
Eosinophils Absolute: 0.4 10*3/uL (ref 0.0–0.5)
Eosinophils Relative: 4 %
HCT: 41.8 % (ref 36.0–46.0)
Hemoglobin: 13.8 g/dL (ref 12.0–15.0)
Immature Granulocytes: 1 %
Lymphocytes Relative: 29 %
Lymphs Abs: 2.8 10*3/uL (ref 0.7–4.0)
MCH: 31.9 pg (ref 26.0–34.0)
MCHC: 33 g/dL (ref 30.0–36.0)
MCV: 96.5 fL (ref 80.0–100.0)
Monocytes Absolute: 0.7 10*3/uL (ref 0.1–1.0)
Monocytes Relative: 8 %
Neutro Abs: 5.7 10*3/uL (ref 1.7–7.7)
Neutrophils Relative %: 57 %
Platelets: 442 10*3/uL — ABNORMAL HIGH (ref 150–400)
RBC: 4.33 MIL/uL (ref 3.87–5.11)
RDW: 14.2 % (ref 11.5–15.5)
WBC: 9.7 10*3/uL (ref 4.0–10.5)
nRBC: 0 % (ref 0.0–0.2)

## 2022-06-25 LAB — BASIC METABOLIC PANEL
Anion gap: 11 (ref 5–15)
BUN: 18 mg/dL (ref 8–23)
CO2: 24 mmol/L (ref 22–32)
Calcium: 9.6 mg/dL (ref 8.9–10.3)
Chloride: 98 mmol/L (ref 98–111)
Creatinine, Ser: 0.62 mg/dL (ref 0.44–1.00)
GFR, Estimated: >60 mL/min (ref >60)
Glucose, Bld: 106 mg/dL — ABNORMAL HIGH (ref 70–99)
Potassium: 4 mmol/L (ref 3.5–5.1)
Sodium: 133 mmol/L — ABNORMAL LOW (ref 135–145)

## 2022-06-25 MED ORDER — GADOBUTROL 1 MMOL/ML IV SOLN
7.0000 mL | Freq: Once | INTRAVENOUS | Status: AC | PRN
  Administered 2022-06-25: 7 mL via INTRAVENOUS

## 2022-06-25 MED ORDER — OXYCODONE-ACETAMINOPHEN 5-325 MG PO TABS
2.0000 | ORAL_TABLET | Freq: Once | ORAL | Status: AC
  Administered 2022-06-25: 2 via ORAL
  Filled 2022-06-25: qty 2

## 2022-06-25 MED ORDER — HYDROMORPHONE HCL 1 MG/ML IJ SOLN
1.0000 mg | Freq: Once | INTRAMUSCULAR | Status: AC
  Administered 2022-06-25: 1 mg via INTRAVENOUS
  Filled 2022-06-25: qty 1

## 2022-06-25 MED ORDER — LORAZEPAM 1 MG PO TABS
1.0000 mg | ORAL_TABLET | Freq: Once | ORAL | Status: AC
  Administered 2022-06-25: 1 mg via ORAL
  Filled 2022-06-25: qty 1

## 2022-06-25 NOTE — ED Notes (Signed)
Per MD Hyacinth Meeker verbal order notified bed placement of patients arrival to [facility].

## 2022-06-25 NOTE — ED Triage Notes (Signed)
Per EMS- patient reports that she fell backwards after slipping on a mat 2 weeks ago. Patient states she injured her lower back. Patient also told EMS she needs an MRI completed.

## 2022-06-25 NOTE — ED Provider Notes (Signed)
[facility]-EMERGENCY DEPT Provider Note   CSN: [id number] Arrival date & time: 06/25/22  1540     History  Chief Complaint  Patient presents with   Back Pain    [name] is a 66 y.o. female presents to the ER via EMS complaining of severe back pain and being unable to ambulate.  Patient states she had a mechanical fall a couple of weeks ago and has been experiencing low back pain since.  She had CT imaging obtained in ED that showed no acute fracture.  Patient and her husband both state they were giving her injury time hoping it would get better, but has continued to worsen.  Patient reports she has increased pain when she attempts to put weight on both of her legs.  Patient has been sedentary for the last week and has not been out of bed.  Patient reports that she has had increasing pain and weakness.  Patient has an MRI scheduled on Monday, but states "I want to get the MRI today and then go home".  Denies urinary retention, loss of bowel or bladder control, fever, numbness, saddle anesthesia, headache.  Patient has history significant for scoliosis and multiple spinal surgeries.  She is ambulatory with walker at her baseline.         Home Medications Prior to Admission medications   Medication Sig Start Date End Date Taking? Authorizing Provider  amLODipine (NORVASC) 5 MG tablet Take 5 mg by mouth in the morning. 10/01/19  Yes [provider]  buPROPion (WELLBUTRIN XL) 300 MG 24 hr tablet Take 300 mg by mouth daily.     Yes [provider]  carvedilol (COREG) 6.25 MG tablet Take 6.25 mg by mouth in the morning and at bedtime.   Yes [provider]  cyclobenzaprine (FLEXERIL) 5 MG tablet Take 5 mg by mouth 3 (three) times daily as needed for muscle spasms.   Yes [provider]  donepezil (ARICEPT) 10 MG tablet Take 10 mg by mouth in the morning.   Yes [provider]  fluvoxaMINE (LUVOX) 100 MG tablet Take 200  mg by mouth at bedtime.   Yes [provider]  lamoTRIgine (LAMICTAL) 100 MG tablet Take 200 mg by mouth in the morning. 03/29/21  Yes [provider]  levothyroxine (SYNTHROID) 88 MCG tablet Take 88 mcg by mouth daily before breakfast. 09/11/20  Yes [provider]  loperamide (IMODIUM) 2 MG capsule Take 4 mg by mouth in the morning.   Yes [provider]  losartan (COZAAR) 100 MG tablet Take 100 mg by mouth at bedtime.   Yes [provider]  meloxicam (MOBIC) 15 MG tablet Take 15 mg by mouth daily as needed for pain. 09/15/20  Yes [provider]  metFORMIN (GLUCOPHAGE) 500 MG tablet Take 1,000 mg by mouth 2 (two) times daily with a meal. 02/10/21  Yes [provider]  Multiple Vitamin (MULTIVITAMIN) tablet Take 1 tablet by mouth daily.     Yes [provider]  pantoprazole (PROTONIX) 40 MG tablet Take 80 mg by mouth at bedtime. 12/11/19  Yes [provider]  QUEtiapine (SEROQUEL) 300 MG tablet Take 600 mg by mouth at bedtime.   Yes [provider]  rosuvastatin (CRESTOR) 20 MG tablet Take 20 mg by mouth at bedtime. 05/28/16  Yes [provider]  Semaglutide, 1 MG/DOSE, (OZEMPIC, 1 MG/DOSE,) 4 MG/3ML SOPN Inject 2 mg into the skin every Monday.   Yes [provider]  traZODone (DESYREL) 50 MG tablet Take 100 mg by mouth at bedtime. 05/07/20  Yes [provider]      Allergies    Patient has no known allergies.    Review of Systems   Review of Systems  Constitutional:  Negative for fever.  Genitourinary:  Negative for decreased urine volume and difficulty urinating.  Musculoskeletal:  Positive for back pain and gait problem.  Neurological:  Positive for weakness. Negative for tremors and numbness.    Physical Exam Updated Vital Signs BP 104/73 (BP Location: Right Arm)   Pulse 74   Temp 97.6 F (36.4 C) (Axillary)   Resp 16   Ht 5\' 1"  (1.549 m)   Wt 72.6 kg   SpO2 98%    BMI 30.23 kg/m  Physical Exam Vitals and nursing note reviewed.  Constitutional:      General: She is not in acute distress.    Appearance: Normal appearance. She is not ill-appearing or diaphoretic.  Pulmonary:     Effort: Pulmonary effort is normal.  Musculoskeletal:     Cervical back: No pain with movement.     Lumbar back: Tenderness present. No bony tenderness. Decreased range of motion.     Comments: Unable to perform complete spinal exam due to patient lying supine and not able to change positions without severe pain. Tenderness to palpation of paraspinal muscles of the lumbar spine, more painful on left.   Neurological:     Mental Status: She is alert. Mental status is at baseline.     Sensory: Sensation is intact.     Motor: No tremor or atrophy.     Comments: Sensation to light touch intact to bilateral lower extremities, limited motor function due to pain.  Patient unable to lift legs without severe pain and unable to push against resistance.    Psychiatric:        Mood and Affect: Mood normal.        Behavior: Behavior normal.     ED Results / Procedures / Treatments   Labs (all labs ordered are listed, but only abnormal results are displayed) Labs Reviewed  BASIC METABOLIC PANEL  CBC WITH DIFFERENTIAL/PLATELET    EKG None  Radiology CT Lumbar Spine Wo Contrast  Result Date: 06/25/2022 CLINICAL DATA:  Initial evaluation for low back pain, known fracture. CT requested by neuro surgery for surgical planning. EXAM: CT LUMBAR SPINE WITHOUT CONTRAST TECHNIQUE: Multidetector CT imaging of the lumbar spine was performed without intravenous contrast administration. Multiplanar CT image reconstructions were also generated. RADIATION DOSE REDUCTION: This exam was performed according to the departmental dose-optimization program which includes automated exposure control, adjustment of the mA and/or kV according to patient size and/or use of iterative reconstruction technique.  COMPARISON:  Prior MRI from earlier the same day. FINDINGS: Segmentation: Standard. Lowest well-formed disc space labeled the L5-S1 level. Alignment: Severe left convex scoliosis, apex at L2. Chronic 5 mm anterolisthesis of L5 on S1. Vertebrae: Previously identified fracture involving the superior endplate of L3 again seen. No more than mild central height loss without significant bony retropulsion. Extension through the fused posterior elements of L2, more pronounced on the right (series 8, image 47) again, constellation of features concerning for possible Chance type fracture, potentially an unstable injury. Mild chronic height loss noted at the superior endplate of T11. Vertebral body height otherwise maintained with no other acute or recent fracture. Visualized sacrum and pelvis intact. No worrisome osseous lesions. Extensive postoperative changes from prior posterior  decompression and fusion seen throughout the lumbar spine. Bone graft harvest sites noted at the iliac wings bilaterally. Right total hip arthroplasty partially visualized. Underlying severe osteopenia. Paraspinal and other soft tissues: Chronic postoperative scarring present within the posterior paraspinous soft tissues. Asymmetric atrophy involving the left psoas muscle. Mild aorto bi-iliac atherosclerotic disease. Secreted IV contrast material noted within the renal collecting systems and bladder. Colonic diverticulosis noted as well. Disc levels: Underlying multilevel degenerative spondylosis and postsurgical changes, described on prior MRI from earlier the same day. No significant spinal stenosis. Foramina are largely patent. IMPRESSION: 1. Previously identified fracture involving the superior endplate of L3 with no more than mild 10% central height loss without bony retropulsion. Horizontal extension through the fused posterior elements of L2, more pronounced on the right. Again, constellation of features concerning for Chance type fracture,  potentially an unstable injury. 2. Extensive postoperative changes from prior posterior decompression and fusion throughout the lumbar spine. No significant residual spinal stenosis. 3. Severe left convex scoliosis, apex at L2. 4. Colonic diverticulosis. Aortic Atherosclerosis (ICD10-I70.0). Electronically Signed   By: Rise Mu M.D.   On: 06/25/2022 21:53   MR Lumbar Spine W Wo Contrast (assess for abscess, cord compression)  Result Date: 06/25/2022 CLINICAL DATA:  Initial evaluation for lumbar radiculopathy, trauma. EXAM: MRI LUMBAR SPINE WITHOUT AND WITH CONTRAST TECHNIQUE: Multiplanar and multiecho pulse sequences of the lumbar spine were obtained without and with intravenous contrast. CONTRAST:  7mL GADAVIST GADOBUTROL 1 MMOL/ML IV SOLN COMPARISON:  Prior MRI from 05/31/2021. FINDINGS: Segmentation: Standard segmentation. Lowest well-formed disc space labeled the L5-S1 level. Same numbering system employed as on previous exam. Alignment: Severe left convex scoliosis again noted, apex at L2. Chronic grade 1 anterolisthesis of L5 on S1, stable. Vertebrae: Acute compression fracture extending through the superior endplate of L3 with no more than mild 10% central height loss without bony retropulsion. Additional linear horizontally oriented transverse fracture extends through the fused posterior elements at L2 (series 20, image 13). Constellation of findings suggestive of a possible Chance type fracture, potentially unstable. Chronic endplate Schmorl's node deformity with mild height loss noted at the superior endplate of T12. Vertebral body height otherwise maintained with no other acute or recent fracture. Bone marrow signal intensity within normal limits. No worrisome osseous lesions. Chronic postoperative changes again noted throughout the lumbar spine. No other abnormal marrow edema or enhancement. Conus medullaris and cauda equina: Conus extends to the L2-3 level. Conus and cauda equina appear  normal. Paraspinal and other soft tissues: Chronic postoperative scarring present throughout the posterior paraspinous musculature. Asymmetric chronic atrophy involving the left psoas muscle noted. Mild soft tissue edema adjacent to the acute fractures. Disc levels: L1-2: Degenerative intervertebral disc space narrowing with diffuse disc bulge and disc desiccation. Reactive endplate spurring. Prior posterior decompression and fusion. No residual spinal stenosis. Foramina remain patent. L2-3: Mild degenerative intervertebral disc space narrowing with diffuse disc bulge. Endplate osteophytic spurring, asymmetric to the right. Prior posterior decompression and fusion. Residual mild right lateral recess narrowing without significant spinal stenosis. Mild right foraminal narrowing. No residual left foraminal stenosis. L3-4: Endplate spurring without significant disc bulge. Posterior decompression and fusion. No residual spinal stenosis. Foramina remain patent. L4-5: Mild right-sided endplate spurring without significant disc bulge. Prior posterior decompression with fusion. No residual spinal stenosis. Foramina remain patent. L5-S1: Chronic anterolisthesis. Prior posterior decompression with fusion. No residual spinal stenosis. Foramina remain patent. IMPRESSION: 1. Acute fracture extending through the superior endplate of L3 with no more than mild  10% central height loss without bony retropulsion. Additional linear horizontally oriented transverse fracture extending through the fused posterior elements at L2. Constellation of findings suggestive of a possible Chance type fracture, potentially unstable. 2. Prior posterior decompression and fusion at L1-2 through L5-S1 without residual spinal stenosis. Residual mild right lateral recess and foraminal narrowing at L2-3. 3. Severe left convex scoliosis, apex at L2. Critical Value/emergent results were called by telephone at the time of interpretation on 06/25/2022 at 8:21 pm  to provider Dr. Rubin Payor, who verbally acknowledged these results. Electronically Signed   By: Rise Mu M.D.   On: 06/25/2022 20:24    Procedures Procedures    Medications Ordered in ED Medications  oxyCODONE-acetaminophen (PERCOCET/ROXICET) 5-325 MG per tablet 2 tablet (2 tablets Oral Given 06/25/22 1723)  LORazepam (ATIVAN) tablet 1 mg (1 mg Oral Given 06/25/22 1829)  gadobutrol (GADAVIST) 1 MMOL/ML injection 7 mL (7 mLs Intravenous Contrast Given 06/25/22 1944)  HYDROmorphone (DILAUDID) injection 1 mg (1 mg Intravenous Given 06/25/22 2148)    ED Course/ Medical Decision Making/ A&P                           Medical Decision Making Amount and/or Complexity of Data Reviewed Labs: ordered. Radiology: ordered.  Risk Prescription drug management. Decision regarding hospitalization.   This patient presents to the ED with chief complaint(s) of severe lumbar back pain with radiculopathy, unable to ambulate, reported increased weakness with pertinent past medical history of scoliosis, spinal surgeries .The complaint involves an extensive differential diagnosis and also carries with it a high risk of complications and morbidity.    The differential diagnosis includes acute lumbar fracture, acute cord compression, disc herniation, spondylolisthesis, acute ligamentous injury, acute muscle strain, degenerative joint disease, spinal stenosis; low suspicion of cauda equina given duration of patient's symptoms and no saddle anesthesia, loss of bowel or bladder control   The initial plan is to provide pain control and obtain MRI of lumbar spine w wo contrast (multiple previous spinal surgeries, severe scoliosis, worsening pain and subjective weakness)  Additional history obtained: Additional history obtained from spouse, patient's husband at bedside Records reviewed  ED visit with [facility] from 06/13/22; patient had CT imaging performed and was determined there was no obvious acute  lumbar fracture, however, one area was suspicious of fracture and MRI follow-up recommended if patient symptoms persisted/worsened  Initial Assessment:   On exam, patient is lying supine and is unable to sit up or change positions.  She appears uncomfortable, but is not in acute distress.  Patient's husband is with her at bedside.  Unable to fully assess lumbar spine due to patient not able to change positions without severe pain.  Tenderness to palpation of paraspinal muscles, left > right.  Patient has scoliosis.  Sensation to light touch intact in bilateral lower extremities.  Patient unable to lift legs off bed due to pain and is not able to resist force.  Normal motor function and sensation in bilateral upper extremities.  No obvious bowel or bladder incontinence.   Independent ECG/labs interpretation:  The following labs were independently interpreted:  CBC and BMP obtained for pre-operative purposes.    Independent visualization and interpretation of imaging: I independently visualized the following imaging with scope of interpretation limited to determining acute life threatening conditions related to emergency care: MRI lumbar spine, which revealed acute L3 fracture, possible Chance fracture.   Consultations obtained: I placed a consult with neurosurgery and spoke  with oncall provider, Dr. Maurice Small, who recommended transferring patient to [facility] for surgery in AM.  Dr. Maurice Small also requested CT imaging of lumbar spine.   Treatment and Reassessment: Will manage patient's pain while in ED.  Discussed MRI findings with patient and her husband and need for transfer to [facility] for surgery in the morning.  Patient and her husband are in agreement with current plan.    Disposition:   Patient presented to ED with continued pain complaint following a mechanical fall, increasing subjective weakness, and unable to ambulate or get out of bed for the past week.  MRI demonstrated a possible  unstable Chance fracture of L3, consultation with neurosurgery was obtained, and Dr. Maurice Small admitted to his service with plan to perform surgery in the morning.  Patient to be transferred to [facility] ED while awaiting bed assignment and surgery.  Baseline labs and ECG obtained, patient will be NPO after midnight, and she will remain supine.  Patient's pain will be managed while awaiting transfer.          Final Clinical Impression(s) / ED Diagnoses Final diagnoses:  Other closed fracture of third lumbar vertebra, initial encounter [facility])    Rx / DC Orders ED Discharge Orders     None         Lenard Simmer, [place] 06/25/22 2215    Tegeler, Canary Brim, MD 06/25/22 [phone]

## 2022-06-25 NOTE — Telephone Encounter (Signed)
  Chief Complaint: patient's husband reports patient in severe pain unable to get out of bed or walk Symptoms: fell 06/13/22. Pain worsening and now unable to get out of bed or walk .  Frequency: today  Pertinent Negatives: Patient denies na Disposition: [x] ED /[] Urgent Care (no appt availability in office) / [] Appointment(In office/virtual)/ []  [facility]/ [] Home Care/ [] Refused Recommended Disposition /[]  Mobile Bus/ []  Follow-up with PCP Additional Notes:   Patient's husband reports he can not get patient up to bring to ED due to severe pain. Recommended patient's husband call 911 / EMS for assistance.     Reason for Disposition  Unable to walk  Answer Assessment - Initial Assessment Questions 1. ONSET: "When did the pain begin?"      After a fall 06/13/22 now pain worsening per patient's husband on DPR 2. LOCATION: "Where does it hurt?" (upper, mid or lower back)     Low back  3. SEVERITY: "How bad is the pain?"  (e.g., Scale 1-10; mild, moderate, or severe)   - MILD (1-3): Doesn't interfere with normal activities.    - MODERATE (4-7): Interferes with normal activities or awakens from sleep.    - SEVERE (8-10): Excruciating pain, unable to do any normal activities.      Severe  4. PATTERN: "Is the pain constant?" (e.g., yes, no; constant, intermittent)      Yes  5. RADIATION: "Does the pain shoot into your legs or somewhere else?"     na 6. CAUSE:  "What do you think is causing the back pain?"      Multiple issues : fall, arthritis  7. BACK OVERUSE:  "Any recent lifting of heavy objects, strenuous work or exercise?"     na 8. MEDICINES: "What have you taken so far for the pain?" (e.g., nothing, acetaminophen, NSAIDS)     na 9. NEUROLOGIC SYMPTOMS: "Do you have any weakness, numbness, or problems with bowel/bladder control?"     Unable to get up out of bed 10. OTHER SYMPTOMS: "Do you have any other symptoms?" (e.g., fever, abdomen pain, burning with  urination, blood in urine)       Low back pain immobile  11. PREGNANCY: "Is there any chance you are pregnant?" "When was your last menstrual period?"       na  Protocols used: Back Pain-A-AH

## 2022-06-26 ENCOUNTER — Inpatient Hospital Stay (HOSPITAL_COMMUNITY): Payer: Medicare Other

## 2022-06-26 ENCOUNTER — Inpatient Hospital Stay (HOSPITAL_COMMUNITY): Payer: Medicare Other | Admitting: Anesthesiology

## 2022-06-26 ENCOUNTER — Encounter (HOSPITAL_COMMUNITY): Payer: Self-pay | Admitting: Neurological Surgery

## 2022-06-26 ENCOUNTER — Encounter (HOSPITAL_COMMUNITY)
Admission: EM | Disposition: A | Discharge status: Skilled Nursing Facility | Payer: Self-pay | Source: Home / Self Care | Attending: Neurological Surgery

## 2022-06-26 ENCOUNTER — Other Ambulatory Visit: Payer: Self-pay

## 2022-06-26 DIAGNOSIS — E119 Type 2 diabetes mellitus without complications: Secondary | ICD-10-CM

## 2022-06-26 DIAGNOSIS — W1830XA Fall on same level, unspecified, initial encounter: Secondary | ICD-10-CM | POA: Diagnosis present

## 2022-06-26 DIAGNOSIS — Z7985 Long-term (current) use of injectable non-insulin antidiabetic drugs: Secondary | ICD-10-CM | POA: Diagnosis not present

## 2022-06-26 DIAGNOSIS — F17200 Nicotine dependence, unspecified, uncomplicated: Secondary | ICD-10-CM

## 2022-06-26 DIAGNOSIS — M419 Scoliosis, unspecified: Secondary | ICD-10-CM | POA: Diagnosis present

## 2022-06-26 DIAGNOSIS — Z8249 Family history of ischemic heart disease and other diseases of the circulatory system: Secondary | ICD-10-CM | POA: Diagnosis not present

## 2022-06-26 DIAGNOSIS — I10 Essential (primary) hypertension: Secondary | ICD-10-CM | POA: Diagnosis present

## 2022-06-26 DIAGNOSIS — Z7401 Bed confinement status: Secondary | ICD-10-CM | POA: Diagnosis not present

## 2022-06-26 DIAGNOSIS — S32031A Stable burst fracture of third lumbar vertebra, initial encounter for closed fracture: Secondary | ICD-10-CM | POA: Diagnosis present

## 2022-06-26 DIAGNOSIS — F32A Depression, unspecified: Secondary | ICD-10-CM | POA: Diagnosis present

## 2022-06-26 DIAGNOSIS — Z79890 Hormone replacement therapy (postmenopausal): Secondary | ICD-10-CM | POA: Diagnosis not present

## 2022-06-26 DIAGNOSIS — Z79891 Long term (current) use of opiate analgesic: Secondary | ICD-10-CM | POA: Diagnosis not present

## 2022-06-26 DIAGNOSIS — F419 Anxiety disorder, unspecified: Secondary | ICD-10-CM | POA: Diagnosis present

## 2022-06-26 DIAGNOSIS — S32009A Unspecified fracture of unspecified lumbar vertebra, initial encounter for closed fracture: Secondary | ICD-10-CM | POA: Diagnosis present

## 2022-06-26 DIAGNOSIS — Z9181 History of falling: Secondary | ICD-10-CM | POA: Diagnosis not present

## 2022-06-26 DIAGNOSIS — F17210 Nicotine dependence, cigarettes, uncomplicated: Secondary | ICD-10-CM | POA: Diagnosis present

## 2022-06-26 DIAGNOSIS — Z79899 Other long term (current) drug therapy: Secondary | ICD-10-CM | POA: Diagnosis not present

## 2022-06-26 DIAGNOSIS — M1990 Unspecified osteoarthritis, unspecified site: Secondary | ICD-10-CM | POA: Diagnosis present

## 2022-06-26 DIAGNOSIS — G3184 Mild cognitive impairment, so stated: Secondary | ICD-10-CM | POA: Diagnosis present

## 2022-06-26 DIAGNOSIS — Z981 Arthrodesis status: Secondary | ICD-10-CM | POA: Diagnosis not present

## 2022-06-26 DIAGNOSIS — E7800 Pure hypercholesterolemia, unspecified: Secondary | ICD-10-CM | POA: Diagnosis present

## 2022-06-26 DIAGNOSIS — Z7984 Long term (current) use of oral hypoglycemic drugs: Secondary | ICD-10-CM | POA: Diagnosis not present

## 2022-06-26 DIAGNOSIS — K219 Gastro-esophageal reflux disease without esophagitis: Secondary | ICD-10-CM | POA: Diagnosis present

## 2022-06-26 HISTORY — PX: POSTERIOR LUMBAR FUSION 4 LEVEL: SHX6037

## 2022-06-26 LAB — TYPE AND SCREEN
ABO/RH(D): O POS
Antibody Screen: NEGATIVE

## 2022-06-26 LAB — GLUCOSE, CAPILLARY
Glucose-Capillary: 112 mg/dL — ABNORMAL HIGH (ref 70–99)
Glucose-Capillary: 158 mg/dL — ABNORMAL HIGH (ref 70–99)
Glucose-Capillary: 181 mg/dL — ABNORMAL HIGH (ref 70–99)
Glucose-Capillary: 182 mg/dL — ABNORMAL HIGH (ref 70–99)

## 2022-06-26 LAB — SURGICAL PCR SCREEN
MRSA, PCR: NEGATIVE
Staphylococcus aureus: POSITIVE — AB

## 2022-06-26 SURGERY — POSTERIOR LUMBAR FUSION 4 LEVEL
Anesthesia: General | Site: Back

## 2022-06-26 MED ORDER — CEFAZOLIN SODIUM-DEXTROSE 1-4 GM/50ML-% IV SOLN
INTRAVENOUS | Status: DC | PRN
  Administered 2022-06-26: 2 g via INTRAVENOUS

## 2022-06-26 MED ORDER — BUPIVACAINE HCL (PF) 0.5 % IJ SOLN
INTRAMUSCULAR | Status: AC
  Filled 2022-06-26: qty 30

## 2022-06-26 MED ORDER — OXYCODONE HCL 5 MG PO TABS: 5.0000 mg | ORAL_TABLET | ORAL | Status: DC | PRN

## 2022-06-26 MED ORDER — ROSUVASTATIN CALCIUM 20 MG PO TABS
20.0000 mg | ORAL_TABLET | Freq: Every day | ORAL | Status: DC
  Administered 2022-06-26 – 2022-07-07 (×13): 20 mg via ORAL
  Filled 2022-06-26 (×13): qty 1

## 2022-06-26 MED ORDER — DIPHENHYDRAMINE HCL 50 MG/ML IJ SOLN: 12.5000 mg | Freq: Four times a day (QID) | INTRAMUSCULAR | Status: DC | PRN

## 2022-06-26 MED ORDER — LOSARTAN POTASSIUM 50 MG PO TABS
100.0000 mg | ORAL_TABLET | Freq: Every day | ORAL | Status: DC
  Administered 2022-06-26 – 2022-07-07 (×13): 100 mg via ORAL
  Filled 2022-06-26 (×13): qty 2

## 2022-06-26 MED ORDER — ONDANSETRON HCL 4 MG/2ML IJ SOLN: 4.0000 mg | Freq: Four times a day (QID) | INTRAMUSCULAR | Status: DC | PRN

## 2022-06-26 MED ORDER — DONEPEZIL HCL 10 MG PO TABS
10.0000 mg | ORAL_TABLET | Freq: Every day | ORAL | Status: DC
  Administered 2022-06-26 – 2022-07-08 (×12): 10 mg via ORAL
  Filled 2022-06-26 (×13): qty 1

## 2022-06-26 MED ORDER — MIDAZOLAM HCL 2 MG/2ML IJ SOLN: 0.5000 mg | Freq: Once | INTRAMUSCULAR | Status: DC | PRN

## 2022-06-26 MED ORDER — ONDANSETRON HCL 4 MG/2ML IJ SOLN
INTRAMUSCULAR | Status: DC | PRN
  Administered 2022-06-26: 4 mg via INTRAVENOUS

## 2022-06-26 MED ORDER — DEXAMETHASONE SODIUM PHOSPHATE 10 MG/ML IJ SOLN
INTRAMUSCULAR | Status: DC | PRN
  Administered 2022-06-26: 10 mg via INTRAVENOUS

## 2022-06-26 MED ORDER — 0.9 % SODIUM CHLORIDE (POUR BTL) OPTIME
TOPICAL | Status: DC | PRN
  Administered 2022-06-26: 1000 mL

## 2022-06-26 MED ORDER — OXYCODONE HCL 5 MG/5ML PO SOLN
ORAL | Status: AC
  Filled 2022-06-26: qty 5

## 2022-06-26 MED ORDER — ADULT MULTIVITAMIN W/MINERALS CH
1.0000 | ORAL_TABLET | Freq: Every day | ORAL | Status: DC
  Administered 2022-06-26 – 2022-07-08 (×11): 1 via ORAL
  Filled 2022-06-26 (×13): qty 1

## 2022-06-26 MED ORDER — DOCUSATE SODIUM 100 MG PO CAPS
100.0000 mg | ORAL_CAPSULE | Freq: Two times a day (BID) | ORAL | Status: DC
  Administered 2022-06-26 – 2022-07-08 (×16): 100 mg via ORAL
  Filled 2022-06-26 (×23): qty 1

## 2022-06-26 MED ORDER — HYDROMORPHONE HCL 1 MG/ML IJ SOLN
1.0000 mg | INTRAMUSCULAR | Status: DC | PRN
  Administered 2022-06-26: 1 mg via INTRAVENOUS
  Filled 2022-06-26: qty 1

## 2022-06-26 MED ORDER — HYDROMORPHONE HCL 1 MG/ML IJ SOLN
INTRAMUSCULAR | Status: AC
  Filled 2022-06-26: qty 2

## 2022-06-26 MED ORDER — MEPERIDINE HCL 25 MG/ML IJ SOLN: 6.2500 mg | INTRAMUSCULAR | Status: DC | PRN

## 2022-06-26 MED ORDER — THROMBIN 5000 UNITS EX SOLR
CUTANEOUS | Status: AC
  Filled 2022-06-26: qty 5000

## 2022-06-26 MED ORDER — FLUVOXAMINE MALEATE 100 MG PO TABS
200.0000 mg | ORAL_TABLET | Freq: Every day | ORAL | Status: DC
  Administered 2022-06-26 – 2022-07-07 (×12): 200 mg via ORAL
  Filled 2022-06-26 (×14): qty 2

## 2022-06-26 MED ORDER — SUGAMMADEX SODIUM 200 MG/2ML IV SOLN
INTRAVENOUS | Status: DC | PRN
  Administered 2022-06-26: 200 mg via INTRAVENOUS

## 2022-06-26 MED ORDER — AMLODIPINE BESYLATE 5 MG PO TABS
5.0000 mg | ORAL_TABLET | Freq: Every morning | ORAL | Status: DC
  Administered 2022-06-26 – 2022-07-08 (×12): 5 mg via ORAL
  Filled 2022-06-26 (×13): qty 1

## 2022-06-26 MED ORDER — OXYCODONE HCL 5 MG PO TABS: 5.0000 mg | ORAL_TABLET | Freq: Once | ORAL | Status: AC | PRN

## 2022-06-26 MED ORDER — THROMBIN 5000 UNITS EX SOLR
OROMUCOSAL | Status: DC | PRN
  Administered 2022-06-26: 5 mL via TOPICAL

## 2022-06-26 MED ORDER — FENTANYL CITRATE (PF) 250 MCG/5ML IJ SOLN
INTRAMUSCULAR | Status: AC
  Filled 2022-06-26: qty 5

## 2022-06-26 MED ORDER — LAMOTRIGINE 100 MG PO TABS
200.0000 mg | ORAL_TABLET | Freq: Every morning | ORAL | Status: DC
  Administered 2022-06-26 – 2022-07-08 (×13): 200 mg via ORAL
  Filled 2022-06-26 (×3): qty 2
  Filled 2022-06-26: qty 8
  Filled 2022-06-26 (×9): qty 2

## 2022-06-26 MED ORDER — ACETAMINOPHEN 650 MG RE SUPP: 650.0000 mg | RECTAL | Status: DC | PRN

## 2022-06-26 MED ORDER — PHENYLEPHRINE HCL-NACL 20-0.9 MG/250ML-% IV SOLN
INTRAVENOUS | Status: DC | PRN
  Administered 2022-06-26: 40 ug/min via INTRAVENOUS

## 2022-06-26 MED ORDER — CHLORHEXIDINE GLUCONATE 0.12 % MT SOLN: 15.0000 mL | Freq: Once | OROMUCOSAL | Status: DC

## 2022-06-26 MED ORDER — LIDOCAINE 2% (20 MG/ML) 5 ML SYRINGE
INTRAMUSCULAR | Status: AC
  Filled 2022-06-26: qty 5

## 2022-06-26 MED ORDER — INSULIN ASPART 100 UNIT/ML IJ SOLN: 0.0000 [IU] | INTRAMUSCULAR | Status: DC | PRN

## 2022-06-26 MED ORDER — CEFAZOLIN SODIUM-DEXTROSE 1-4 GM/50ML-% IV SOLN
INTRAVENOUS | Status: AC
  Filled 2022-06-26: qty 100

## 2022-06-26 MED ORDER — SODIUM CHLORIDE 0.9 % IV SOLN: INTRAVENOUS | Status: DC

## 2022-06-26 MED ORDER — OXYCODONE HCL 5 MG/5ML PO SOLN
5.0000 mg | Freq: Once | ORAL | Status: AC | PRN
  Administered 2022-06-26: 5 mg via ORAL

## 2022-06-26 MED ORDER — QUETIAPINE FUMARATE 100 MG PO TABS
600.0000 mg | ORAL_TABLET | Freq: Every day | ORAL | Status: DC
  Administered 2022-06-26 – 2022-07-07 (×13): 600 mg via ORAL
  Filled 2022-06-26 (×13): qty 6

## 2022-06-26 MED ORDER — CYCLOBENZAPRINE HCL 10 MG PO TABS: 5.0000 mg | ORAL_TABLET | Freq: Three times a day (TID) | ORAL | Status: DC | PRN

## 2022-06-26 MED ORDER — LIDOCAINE-EPINEPHRINE 1 %-1:100000 IJ SOLN
INTRAMUSCULAR | Status: AC
  Filled 2022-06-26: qty 1

## 2022-06-26 MED ORDER — PROPOFOL 10 MG/ML IV BOLUS
INTRAVENOUS | Status: DC | PRN
  Administered 2022-06-26: 130 mg via INTRAVENOUS

## 2022-06-26 MED ORDER — HYDROMORPHONE 1 MG/ML IV SOLN
INTRAVENOUS | Status: DC
  Administered 2022-06-27: 6.3 mg via INTRAVENOUS
  Filled 2022-06-26: qty 30

## 2022-06-26 MED ORDER — ACETAMINOPHEN 325 MG PO TABS
650.0000 mg | ORAL_TABLET | ORAL | Status: DC | PRN
  Administered 2022-06-27 – 2022-07-08 (×4): 650 mg via ORAL
  Filled 2022-06-26 (×4): qty 2

## 2022-06-26 MED ORDER — EPHEDRINE SULFATE-NACL 50-0.9 MG/10ML-% IV SOSY
PREFILLED_SYRINGE | INTRAVENOUS | Status: DC | PRN
  Administered 2022-06-26: 5 mg via INTRAVENOUS
  Administered 2022-06-26: 2.5 mg via INTRAVENOUS
  Administered 2022-06-26: 5 mg via INTRAVENOUS
  Administered 2022-06-26: 2.5 mg via INTRAVENOUS
  Administered 2022-06-26: 5 mg via INTRAVENOUS

## 2022-06-26 MED ORDER — MIDAZOLAM HCL 5 MG/5ML IJ SOLN
INTRAMUSCULAR | Status: DC | PRN
  Administered 2022-06-26: 2 mg via INTRAVENOUS

## 2022-06-26 MED ORDER — LIDOCAINE-EPINEPHRINE 1 %-1:100000 IJ SOLN
INTRAMUSCULAR | Status: DC | PRN
  Administered 2022-06-26: 5 mL via INTRADERMAL

## 2022-06-26 MED ORDER — LIDOCAINE 2% (20 MG/ML) 5 ML SYRINGE
INTRAMUSCULAR | Status: DC | PRN
  Administered 2022-06-26: 40 mg via INTRAVENOUS

## 2022-06-26 MED ORDER — SEMAGLUTIDE (1 MG/DOSE) 4 MG/3ML ~~LOC~~ SOPN
2.0000 mg | PEN_INJECTOR | SUBCUTANEOUS | Status: DC
  Filled 2022-06-26 (×2): qty 1.5

## 2022-06-26 MED ORDER — DEXAMETHASONE SODIUM PHOSPHATE 10 MG/ML IJ SOLN
INTRAMUSCULAR | Status: AC
  Filled 2022-06-26: qty 1

## 2022-06-26 MED ORDER — METFORMIN HCL 500 MG PO TABS
1000.0000 mg | ORAL_TABLET | Freq: Two times a day (BID) | ORAL | Status: DC
  Administered 2022-06-26 – 2022-07-08 (×24): 1000 mg via ORAL
  Filled 2022-06-26 (×26): qty 2

## 2022-06-26 MED ORDER — POLYETHYLENE GLYCOL 3350 17 G PO PACK
17.0000 g | PACK | Freq: Every day | ORAL | Status: DC | PRN
  Administered 2022-07-01 – 2022-07-03 (×2): 17 g via ORAL
  Filled 2022-06-26 (×3): qty 1

## 2022-06-26 MED ORDER — BUPROPION HCL ER (XL) 150 MG PO TB24
300.0000 mg | ORAL_TABLET | Freq: Every day | ORAL | Status: DC
  Administered 2022-06-26 – 2022-07-08 (×12): 300 mg via ORAL
  Filled 2022-06-26 (×9): qty 2
  Filled 2022-06-26: qty 1
  Filled 2022-06-26 (×3): qty 2

## 2022-06-26 MED ORDER — CARVEDILOL 6.25 MG PO TABS
6.2500 mg | ORAL_TABLET | Freq: Two times a day (BID) | ORAL | Status: DC
  Administered 2022-06-26 – 2022-07-08 (×24): 6.25 mg via ORAL
  Filled 2022-06-26 (×4): qty 1
  Filled 2022-06-26: qty 2
  Filled 2022-06-26 (×20): qty 1

## 2022-06-26 MED ORDER — BUPIVACAINE HCL (PF) 0.5 % IJ SOLN
INTRAMUSCULAR | Status: DC | PRN
  Administered 2022-06-26: 5 mL

## 2022-06-26 MED ORDER — ACETAMINOPHEN 10 MG/ML IV SOLN
INTRAVENOUS | Status: DC | PRN
  Administered 2022-06-26: 1000 mg via INTRAVENOUS

## 2022-06-26 MED ORDER — ONDANSETRON HCL 4 MG/2ML IJ SOLN
INTRAMUSCULAR | Status: AC
  Filled 2022-06-26: qty 2

## 2022-06-26 MED ORDER — NALOXONE HCL 0.4 MG/ML IJ SOLN: 0.4000 mg | INTRAMUSCULAR | Status: DC | PRN

## 2022-06-26 MED ORDER — SUCCINYLCHOLINE CHLORIDE 200 MG/10ML IV SOSY
PREFILLED_SYRINGE | INTRAVENOUS | Status: DC | PRN
  Administered 2022-06-26: 120 mg via INTRAVENOUS

## 2022-06-26 MED ORDER — PHENYLEPHRINE 80 MCG/ML (10ML) SYRINGE FOR IV PUSH (FOR BLOOD PRESSURE SUPPORT)
PREFILLED_SYRINGE | INTRAVENOUS | Status: DC | PRN
  Administered 2022-06-26 (×3): 80 ug via INTRAVENOUS
  Administered 2022-06-26: 240 ug via INTRAVENOUS
  Administered 2022-06-26: 80 ug via INTRAVENOUS

## 2022-06-26 MED ORDER — ONDANSETRON HCL 4 MG PO TABS: 4.0000 mg | ORAL_TABLET | Freq: Four times a day (QID) | ORAL | Status: DC | PRN

## 2022-06-26 MED ORDER — MIDAZOLAM HCL 2 MG/2ML IJ SOLN
INTRAMUSCULAR | Status: AC
  Filled 2022-06-26: qty 2

## 2022-06-26 MED ORDER — LOPERAMIDE HCL 2 MG PO CAPS
4.0000 mg | ORAL_CAPSULE | Freq: Every morning | ORAL | Status: DC
  Administered 2022-06-26 – 2022-07-06 (×6): 4 mg via ORAL
  Filled 2022-06-26 (×11): qty 2

## 2022-06-26 MED ORDER — PANTOPRAZOLE SODIUM 40 MG PO TBEC
80.0000 mg | DELAYED_RELEASE_TABLET | Freq: Every day | ORAL | Status: DC
  Administered 2022-06-26 – 2022-07-07 (×13): 80 mg via ORAL
  Filled 2022-06-26 (×13): qty 2

## 2022-06-26 MED ORDER — ACETAMINOPHEN 10 MG/ML IV SOLN
INTRAVENOUS | Status: AC
  Filled 2022-06-26: qty 100

## 2022-06-26 MED ORDER — SODIUM CHLORIDE 0.9% FLUSH: 9.0000 mL | INTRAVENOUS | Status: DC | PRN

## 2022-06-26 MED ORDER — PHENOL 1.4 % MT LIQD: 1.0000 | OROMUCOSAL | Status: DC | PRN

## 2022-06-26 MED ORDER — LEVOTHYROXINE SODIUM 88 MCG PO TABS
88.0000 ug | ORAL_TABLET | Freq: Every day | ORAL | Status: DC
  Administered 2022-06-26 – 2022-07-08 (×13): 88 ug via ORAL
  Filled 2022-06-26 (×13): qty 1

## 2022-06-26 MED ORDER — ONDANSETRON HCL 4 MG/2ML IJ SOLN
4.0000 mg | Freq: Four times a day (QID) | INTRAMUSCULAR | Status: DC | PRN
  Administered 2022-06-27 – 2022-07-01 (×2): 4 mg via INTRAVENOUS
  Filled 2022-06-26 (×2): qty 2

## 2022-06-26 MED ORDER — TRAZODONE HCL 50 MG PO TABS
100.0000 mg | ORAL_TABLET | Freq: Every day | ORAL | Status: DC
  Administered 2022-06-26 – 2022-07-07 (×13): 100 mg via ORAL
  Filled 2022-06-26 (×13): qty 2

## 2022-06-26 MED ORDER — OXYCODONE HCL 5 MG PO TABS
10.0000 mg | ORAL_TABLET | ORAL | Status: DC | PRN
  Administered 2022-06-26 (×2): 10 mg via ORAL
  Filled 2022-06-26 (×2): qty 2

## 2022-06-26 MED ORDER — FENTANYL CITRATE (PF) 250 MCG/5ML IJ SOLN
INTRAMUSCULAR | Status: DC | PRN
  Administered 2022-06-26 (×2): 50 ug via INTRAVENOUS
  Administered 2022-06-26: 25 ug via INTRAVENOUS
  Administered 2022-06-26 (×2): 50 ug via INTRAVENOUS
  Administered 2022-06-26: 25 ug via INTRAVENOUS

## 2022-06-26 MED ORDER — ORAL CARE MOUTH RINSE: 15.0000 mL | Freq: Once | OROMUCOSAL | Status: DC

## 2022-06-26 MED ORDER — CYCLOBENZAPRINE HCL 10 MG PO TABS
10.0000 mg | ORAL_TABLET | Freq: Three times a day (TID) | ORAL | Status: DC | PRN
  Administered 2022-06-27 – 2022-07-03 (×9): 10 mg via ORAL
  Filled 2022-06-26 (×11): qty 1

## 2022-06-26 MED ORDER — PROMETHAZINE HCL 25 MG/ML IJ SOLN: 6.2500 mg | INTRAMUSCULAR | Status: DC | PRN

## 2022-06-26 MED ORDER — DIPHENHYDRAMINE HCL 12.5 MG/5ML PO ELIX: 12.5000 mg | ORAL_SOLUTION | Freq: Four times a day (QID) | ORAL | Status: DC | PRN

## 2022-06-26 MED ORDER — ALBUMIN HUMAN 5 % IV SOLN: INTRAVENOUS | Status: DC | PRN

## 2022-06-26 MED ORDER — PROPOFOL 10 MG/ML IV BOLUS
INTRAVENOUS | Status: AC
  Filled 2022-06-26: qty 20

## 2022-06-26 MED ORDER — ROCURONIUM BROMIDE 10 MG/ML (PF) SYRINGE
PREFILLED_SYRINGE | INTRAVENOUS | Status: DC | PRN
  Administered 2022-06-26 (×2): 10 mg via INTRAVENOUS
  Administered 2022-06-26: 60 mg via INTRAVENOUS

## 2022-06-26 MED ORDER — HYDROMORPHONE HCL 1 MG/ML IJ SOLN
0.2500 mg | INTRAMUSCULAR | Status: AC | PRN
  Administered 2022-06-26 (×8): 0.5 mg via INTRAVENOUS

## 2022-06-26 MED ORDER — ROCURONIUM BROMIDE 10 MG/ML (PF) SYRINGE
PREFILLED_SYRINGE | INTRAVENOUS | Status: AC
  Filled 2022-06-26: qty 10

## 2022-06-26 MED ORDER — MENTHOL 3 MG MT LOZG: 1.0000 | LOZENGE | OROMUCOSAL | Status: DC | PRN

## 2022-06-26 MED ORDER — LACTATED RINGERS IV SOLN: INTRAVENOUS | Status: DC

## 2022-06-26 SURGICAL SUPPLY — 85 items
ADH SKN CLS APL DERMABOND .7 (GAUZE/BANDAGES/DRESSINGS) ×2
ADH SKN CLS LQ APL DERMABOND (GAUZE/BANDAGES/DRESSINGS) ×4
APL SKNCLS STERI-STRIP NONHPOA (GAUZE/BANDAGES/DRESSINGS)
BAG COUNTER SPONGE SURGICOUNT (BAG) ×3 IMPLANT
BAG SPNG CNTER NS LX DISP (BAG) ×2
BASKET BONE COLLECTION (BASKET) ×3 IMPLANT
BENZOIN TINCTURE PRP APPL 2/3 (GAUZE/BANDAGES/DRESSINGS) IMPLANT
BLADE BONE MILL MEDIUM (MISCELLANEOUS) IMPLANT
BLADE CLIPPER SURG (BLADE) IMPLANT
BLADE SURG 11 STRL SS (BLADE) ×3 IMPLANT
BUR 14 MATCH 3 (BUR) IMPLANT
BUR 15 MATCH 2.2 (BUR) IMPLANT
BUR MATCHSTICK NEURO 3.0 LAGG (BURR) ×3 IMPLANT
BUR PRECISION FLUTE 5.0 (BURR) ×3 IMPLANT
BURR 14 MATCH 3 (BUR) ×2
BURR 15 MATCH 2.2 (BUR) ×2
CANISTER SUCT 3000ML PPV (MISCELLANEOUS) ×3 IMPLANT
CNTNR URN SCR LID CUP LEK RST (MISCELLANEOUS) ×3 IMPLANT
CONT SPEC 4OZ STRL OR WHT (MISCELLANEOUS) ×2
COVER BACK TABLE 60X90IN (DRAPES) ×3 IMPLANT
COVERAGE SUPPORT O-ARM STEALTH (MISCELLANEOUS) ×2 IMPLANT
DERMABOND ADVANCED .7 DNX12 (GAUZE/BANDAGES/DRESSINGS) ×3 IMPLANT
DERMABOND ADVANCED .7 DNX6 (GAUZE/BANDAGES/DRESSINGS) IMPLANT
DRAPE C-ARM 42X72 X-RAY (DRAPES) IMPLANT
DRAPE C-ARMOR (DRAPES) IMPLANT
DRAPE LAPAROTOMY 100X72X124 (DRAPES) ×3 IMPLANT
DRAPE SHEET LG 3/4 BI-LAMINATE (DRAPES) IMPLANT
DRAPE SURG 17X23 STRL (DRAPES) ×3 IMPLANT
DURAPREP 26ML APPLICATOR (WOUND CARE) ×3 IMPLANT
ELECT REM PT RETURN 9FT ADLT (ELECTROSURGICAL) ×2
ELECTRODE REM PT RTRN 9FT ADLT (ELECTROSURGICAL) ×3 IMPLANT
FEE COVERAGE SUPPORT O-ARM (MISCELLANEOUS) IMPLANT
GAUZE 4X4 16PLY ~~LOC~~+RFID DBL (SPONGE) IMPLANT
GAUZE SPONGE 4X4 12PLY STRL (GAUZE/BANDAGES/DRESSINGS) IMPLANT
GLOVE BIOGEL PI IND STRL 7.5 (GLOVE) ×6 IMPLANT
GLOVE ECLIPSE 7.5 STRL STRAW (GLOVE) ×6 IMPLANT
GLOVE EXAM NITRILE LRG STRL (GLOVE) IMPLANT
GLOVE EXAM NITRILE XL STR (GLOVE) IMPLANT
GLOVE EXAM NITRILE XS STR PU (GLOVE) IMPLANT
GOWN STRL REUS W/ TWL LRG LVL3 (GOWN DISPOSABLE) ×12 IMPLANT
GOWN STRL REUS W/ TWL XL LVL3 (GOWN DISPOSABLE) IMPLANT
GOWN STRL REUS W/TWL 2XL LVL3 (GOWN DISPOSABLE) IMPLANT
GOWN STRL REUS W/TWL LRG LVL3 (GOWN DISPOSABLE) ×8
GOWN STRL REUS W/TWL XL LVL3 (GOWN DISPOSABLE)
GRAFT BN 5X1XSPNE CVD POST DBM (Bone Implant) IMPLANT
GRAFT BONE MAGNIFUSE 1X5CM (Bone Implant) ×2 IMPLANT
HEMOSTAT POWDER KIT SURGIFOAM (HEMOSTASIS) ×3 IMPLANT
KIT BASIN OR (CUSTOM PROCEDURE TRAY) ×3 IMPLANT
KIT INFUSE SMALL (Orthopedic Implant) IMPLANT
KIT POSITION SURG JACKSON T1 (MISCELLANEOUS) ×3 IMPLANT
KIT TURNOVER KIT B (KITS) ×3 IMPLANT
MARKER SPHERE PSV REFLC NDI (MISCELLANEOUS) IMPLANT
MILL BONE PREP (MISCELLANEOUS) ×3 IMPLANT
NDL HYPO 18GX1.5 BLUNT FILL (NEEDLE) IMPLANT
NDL SPNL 18GX3.5 QUINCKE PK (NEEDLE) IMPLANT
NEEDLE HYPO 18GX1.5 BLUNT FILL (NEEDLE) IMPLANT
NEEDLE HYPO 22GX1.5 SAFETY (NEEDLE) ×3 IMPLANT
NEEDLE SPNL 18GX3.5 QUINCKE PK (NEEDLE) IMPLANT
NS IRRIG 1000ML POUR BTL (IV SOLUTION) ×3 IMPLANT
PACK LAMINECTOMY NEURO (CUSTOM PROCEDURE TRAY) ×3 IMPLANT
PAD ARMBOARD 7.5X6 YLW CONV (MISCELLANEOUS) ×9 IMPLANT
PIN BONE FIX 100 (PIN) IMPLANT
ROD SPINAL TI CP4 NS 5.5X120 (Rod) IMPLANT
SCREW FENS MAS 5.5X30 (Screw) IMPLANT
SCREW FENS SOLERA 5.5X35 (Screw) IMPLANT
SCREW MAS FENS 4.5X40 (Screw) IMPLANT
SCREW SET SOLERA (Screw) ×20 IMPLANT
SCREW SET SOLERA TI5.5 (Screw) IMPLANT
SCREW SOLERA 4.5X45 (Screw) IMPLANT
SCREW SOLERA 45X5.5XMA (Screw) IMPLANT
SCREW SOLERA 5.5X45MM (Screw) ×2 IMPLANT
SPIKE FLUID TRANSFER (MISCELLANEOUS) ×3 IMPLANT
SPONGE SURGIFOAM ABS GEL 100 (HEMOSTASIS) IMPLANT
SPONGE T-LAP 4X18 ~~LOC~~+RFID (SPONGE) IMPLANT
STRIP CLOSURE SKIN 1/2X4 (GAUZE/BANDAGES/DRESSINGS) IMPLANT
SUT MNCRL AB 3-0 PS2 18 (SUTURE) ×3 IMPLANT
SUT VIC AB 0 CT1 18XCR BRD8 (SUTURE) ×3 IMPLANT
SUT VIC AB 0 CT1 8-18 (SUTURE) ×2
SUT VIC AB 2-0 CP2 18 (SUTURE) ×3 IMPLANT
SYR 3ML LL SCALE MARK (SYRINGE) IMPLANT
TOWEL GREEN STERILE (TOWEL DISPOSABLE) ×3 IMPLANT
TOWEL GREEN STERILE FF (TOWEL DISPOSABLE) ×3 IMPLANT
TRAY FOLEY MTR SLVR 16FR STAT (SET/KITS/TRAYS/PACK) ×3 IMPLANT
TUBE CONNECTING 12X1/4 (SUCTIONS) IMPLANT
WATER STERILE IRR 1000ML POUR (IV SOLUTION) ×3 IMPLANT

## 2022-06-26 NOTE — Anesthesia Preprocedure Evaluation (Addendum)
Anesthesia Evaluation  Patient identified by MRN, date of birth, ID band Patient awake    Reviewed: Allergy & Precautions, NPO status , Patient's Chart, lab work & pertinent test results, reviewed documented beta blocker date and time   History of Anesthesia Complications Negative for: history of anesthetic complications  Airway Mallampati: II  TM Distance: >3 FB Neck ROM: Full    Dental  (+) Dental Advisory Given   Pulmonary Current Smoker and Patient abstained from smoking.   breath sounds clear to auscultation       Cardiovascular hypertension, Pt. on medications and Pt. on home beta blockers (-) angina + Valvular Problems/Murmurs (small membranous VSD)  Rhythm:Regular Rate:Normal + Systolic murmurs '20 ECHO: EF 82-95%. The LV has mildly decreased function. Mildly increased  left ventricular size. There is no LVH.   2. Elevated mean left atrial pressure.   3. Left ventricular diastolic Doppler parameters are consistent with  impaired relaxation pattern of LV diastolic filling.   4. Global right ventricle has normal systolic function  Trivial MR, mild TR, aortic sclerosis without stenosis There is  a small membranous (infracristal) ventricular septal defect with shunting.      Neuro/Psych   Anxiety Depression    negative neurological ROS     GI/Hepatic Neg liver ROS,GERD  Medicated and Controlled,,  Endo/Other  diabetes (glu 112), Oral Hypoglycemic AgentsHypothyroidism  Ozempic: last shot 10d ago BMI 30.25  Renal/GU negative Renal ROS     Musculoskeletal  (+) Arthritis ,    Abdominal  (+) + obese  Peds  Hematology negative hematology ROS (+)   Anesthesia Other Findings   Reproductive/Obstetrics                              Anesthesia Physical Anesthesia Plan  ASA: 3  Anesthesia Plan: General   Post-op Pain Management: Ofirmev IV (intra-op)*   Induction: Intravenous and Rapid  sequence  PONV Risk Score and Plan: 2 and Ondansetron and Dexamethasone  Airway Management Planned: Oral ETT  Additional Equipment: None  Intra-op Plan:   Post-operative Plan: Extubation in OR  Informed Consent: I have reviewed the patients History and Physical, chart, labs and discussed the procedure including the risks, benefits and alternatives for the proposed anesthesia with the patient or authorized representative who has indicated his/her understanding and acceptance.     Dental advisory given  Plan Discussed with: CRNA and Surgeon  Anesthesia Plan Comments:          Anesthesia Quick Evaluation

## 2022-06-26 NOTE — Transfer of Care (Signed)
Immediate Anesthesia Transfer of Care Note  Patient: [name]  Procedure(s) Performed: Lumbar One To Lumbar Five Posterior Instrumented Fusion (Back) Application of O-Arm  Patient Location: PACU  Anesthesia Type:General  Level of Consciousness: awake, alert , and oriented  Airway & Oxygen Therapy: Patient Spontanous Breathing  Post-op Assessment: Report given to RN, Post -op Vital signs reviewed and stable, and Patient moving all extremities X 4  Post vital signs: Reviewed and stable  Last Vitals:  Vitals Value Taken Time  BP    Temp    Pulse    Resp    SpO2      Last Pain:  Vitals:   06/26/22 1018  TempSrc: Oral  PainSc:          Complications: No notable events documented.

## 2022-06-26 NOTE — Anesthesia Procedure Notes (Signed)
Procedure Name: Intubation Date/Time: 06/26/2022 11:16 AM  Performed by: Kyung Rudd, CRNAPre-anesthesia Checklist: Patient identified, Emergency Drugs available, Suction available and Patient being monitored Patient Re-evaluated:Patient Re-evaluated prior to induction Oxygen Delivery Method: Circle system utilized Preoxygenation: Pre-oxygenation with 100% oxygen Induction Type: IV induction and Cricoid Pressure applied Ventilation: Mask ventilation without difficulty Laryngoscope Size: Mac and 3 Grade View: Grade I Tube type: Oral Tube size: 7.0 mm Number of attempts: 1 Airway Equipment and Method: Stylet Placement Confirmation: ETT inserted through vocal cords under direct vision, positive ETCO2 and breath sounds checked- equal and bilateral Secured at: 22 cm Tube secured with: Tape Dental Injury: Teeth and Oropharynx as per pre-operative assessment

## 2022-06-26 NOTE — Op Note (Signed)
PATIENT: [name]  DAY OF SURGERY: 06/26/22   PRE-OPERATIVE DIAGNOSIS:  Closed unstable fracture of the lumbar spine   POST-OPERATIVE DIAGNOSIS:  Same   PROCEDURE:  L1-L5 fracture stabilization and posterolateral instrumented fusion   SURGEON:  Surgeon(s) and Role:    Jadene Pierini, MD - Primary   ANESTHESIA: ETGA   BRIEF HISTORY: This is a 66 year old woman with a history of prior thoracolumbar instrumented fusion s/p hardware removal who presented after a fall with severe pain. Workup showed an L3 chance fracture, I therefore recommended surgical stabilization.    OPERATIVE DETAIL: The patient was taken to the operating room and anesthesia was induced by the anesthesia team. They were placed on the OR table in the prone position with padding of all pressure points. A formal time out was performed with two patient identifiers and confirmed the operative site. The operative site was marked, hair was clipped with surgical clippers, the area was then prepped and draped in a sterile fashion. Fluoro was used to localize the operative level and the patient's prior midline lumbar incision was opened to expose L1-L5. Subperiosteal dissection was performed bilaterally. The fracture lines were visible posteriorly and there was hematoma in the paraspinal musculature at this level, consistent with the fracture.  Given the lack of spinous process or area suitable for array clamp placement, I made a stab incision over the right iliac crest and placed a percutaneous pin here, followed by attachment of a reference array. The field was draped and the O-arm was brought into the field. An intra-op CT was performed, sent to the Stealth navigation station, registered to the patient's anatomy, and confirmed with landmarks with acceptable fit. Stereotactic spinal navigation was utilized throughout the procedure for planning and placement of pedicle screw trajectories.  Instrumentation was then  performed. I first attempted a traditional pedicle screw trajectory and the purchase was poor in the vertebral body bone with poor pull-out strength. I therefore removed this and started on the left placing a cortical trajectory. With each level, I had great purchase proximally. As expected, with the patient's deformity and the complete lack of surface landmarks (with essentially just a smooth block of bone for surface anatomy), navigation was heavily relied upon for screw placement. Given this and how small the pedicles were and how large the posterior fusion mass was, I transitioned between techniques and mainly used could be best described as a laminar trajectory at multiple levels, similar to C2 laminar screws but obviously uncrossed. On the left at L1, the fusion mass wasn't large enough for this trajectory, so I used a traditional pedicle trajectory here.  Bilateral screws ([employer]) were placed at L1, L2, L3, L4, and L5. The screws were placed at each level with navigated instruments by localizing the pedicle, drilling a pilot hole, cannulating with an awl-tap, palpating for breaches, and then placing the screw.   These were connected with rods bilaterally and final tightened according to manufacturer torque specifications. The bone was thoroughly decorticated over the fusion surfaces and fracture surfaces and allograft was placed along the posterolateral fusion surfaces along with the addition of rBMP.   All instrument and sponge counts were correct, the incision was then closed in layers. The patient was then returned to anesthesia for emergence. No apparent complications at the completion of the procedure.   EBL:    DRAINS: none   SPECIMENS: none   Jadene Pierini, MD 06/26/22 8:49 AM

## 2022-06-26 NOTE — Anesthesia Postprocedure Evaluation (Signed)
Anesthesia Post Note  Patient: [name]  Procedure(s) Performed: Lumbar One To Lumbar Five Posterior Instrumented Fusion (Back) Application of O-Arm     Patient location during evaluation: PACU Anesthesia Type: General Level of consciousness: awake and alert, patient cooperative and oriented Pain control: pain improving. Vital Signs Assessment: post-procedure vital signs reviewed and stable Respiratory status: spontaneous breathing, nonlabored ventilation, respiratory function stable and patient connected to nasal cannula oxygen Cardiovascular status: blood pressure returned to baseline and stable Postop Assessment: no apparent nausea or vomiting Anesthetic complications: no   No notable events documented.  Last Vitals:  Vitals:   06/26/22 1545 06/26/22 1600  BP: (!) 123/92 118/66  Pulse: 79 80  Resp: 11 15  Temp:  36.7 C  SpO2: 92% 92%    Last Pain:  Vitals:   06/26/22 1600  TempSrc:   PainSc: 7                  Roshawn Ayala,E. Amelia Burgard

## 2022-06-26 NOTE — Progress Notes (Addendum)
Neurosurgery Service Post-operative progress note  Assessment & Plan: 66 y.o. woman s/p L1-L5 PSIF, seen at bedside, strength 5/5x4 with SILTx4, having a lot of back / incisional pain but no radicular pain, no new numbness.  -reviewed CT with her at bedside, discussed the difficulty of finding good enough purchase with pedicle trajectories, she is asymptomatic from the canal / lateral recess violations. I discussed that we could revise them tomorrow in the OR if she would like, she'd like to see if she gets symptoms and then revise at that point, which I think is reasonable. -will switch her over to a PCA for incisional pain, current regimen is falling a bit short in pain control -activity as tolerated, no brace needed -SQH POD2  Jadene Pierini  06/26/22 8:22 PM

## 2022-06-26 NOTE — ED Notes (Signed)
Patient NPO after midnight, patient scheduled to go to OR in am.

## 2022-06-26 NOTE — H&P (Signed)
Neurosurgery H&P  CC: Low back pain  HPI: This is a 66 y.o. woman that presents with 2 weeks of severe low back pain after a fall. Notably, does have a history of what sounds like T10-pelvis 25 years ago for scoliosis. No new weakness, numbness, or parasthesias, no recent change in bowel or bladder function. No recent use of anti-platelet or anti-coagulant medications. The pain is severe, she has been bedbound for the past 2 weeks and unable to ambulate or even sit up due to severe pain in her low back, no radicular pain.    ROS: A 14 point ROS was performed and is negative except as noted in the HPI.   PMHx:  Past Medical History:  Diagnosis Date   Anemia    Anxiety    Arthritis    Depression    GERD (gastroesophageal reflux disease)    Heart murmur    "related to VSD"   High cholesterol    History of blood transfusion    "related to OR" (08/19/2016)   History of hiatal hernia    Hypertension    Hyperthyroidism    Mild cognitive impairment 09/06/2018   Paroxysmal ventricular tachycardia (HCC)    Type II diabetes mellitus (HCC)    UTI (urinary tract infection)    being treated with Keflex   Ventricular septal defect    FamHx:  Family History  Problem Relation Age of Onset   Other Mother        alive   Stroke Father 32       deceased   Dementia Father    Chorea Maternal Grandfather    Dementia Maternal Aunt    Dementia Maternal Aunt    Heart attack Other        multiple uncles have died with myocardial infarction   SocHx:  reports that she has been smoking cigarettes. She has never used smokeless tobacco. She reports that she does not drink alcohol and does not use drugs.  Exam: Vital signs in last 24 hours: Temp:  [97.6 F (36.4 C)-98.4 F (36.9 C)] 97.7 F (36.5 C) (12/09 0535) Pulse Rate:  [74-83] 81 (12/09 0730) Resp:  [15-22] 17 (12/09 0700) BP: (94-117)/(61-83) 101/78 (12/09 0739) SpO2:  [93 %-98 %] 97 % (12/09 0730) Weight:  [72.6 kg] 72.6 kg (12/08  1617) General: Awake, alert, cooperative, lying in bed, appears uncomfortable Head: Normocephalic and atruamatic HEENT: Neck supple Pulmonary: breathing room air comfortably, no evidence of increased work of breathing Psych: affect full and reactive Extremities: Warm and well perfused x4 Neuro: Strength 5/5 x4, SILTx4, no clonus  Assessment and Plan: 66 y.o. woman s/p fall. MRI L-spine and CT L-spine personally reviewed, which shows post-op changes with large fusion mass, fracture through all three columns of L3.   -OR today for stabilization of the fracture  -4NP post-op  Jadene Pierini, MD 06/26/22 8:47 AM [facility]

## 2022-06-27 MED ORDER — CHLORHEXIDINE GLUCONATE CLOTH 2 % EX PADS
6.0000 | MEDICATED_PAD | Freq: Every day | CUTANEOUS | Status: AC
  Administered 2022-06-27 – 2022-07-01 (×4): 6 via TOPICAL

## 2022-06-27 MED ORDER — HYDROMORPHONE 1 MG/ML IV SOLN
INTRAVENOUS | Status: DC
  Administered 2022-06-27: 6.9 mg via INTRAVENOUS

## 2022-06-27 MED ORDER — HYDROMORPHONE 1 MG/ML IV SOLN
INTRAVENOUS | Status: DC
  Administered 2022-06-27: 6 mg via INTRAVENOUS
  Administered 2022-06-28: 1.2 mg via INTRAVENOUS
  Filled 2022-06-27 (×2): qty 30

## 2022-06-27 MED ORDER — MUPIROCIN 2 % EX OINT
1.0000 | TOPICAL_OINTMENT | Freq: Two times a day (BID) | CUTANEOUS | Status: AC
  Administered 2022-06-27 – 2022-06-30 (×10): 1 via NASAL
  Filled 2022-06-27: qty 22

## 2022-06-27 MED ORDER — HYDROMORPHONE 1 MG/ML IV SOLN: INTRAVENOUS | Status: DC

## 2022-06-27 NOTE — Progress Notes (Addendum)
Neurosurgery Service Progress Note  Subjective: No acute events overnight, but having significant back pain, PCA helped but still quite severe, no radicular pain - she has been monitoring for any radicular pain after we talked last night, no weakness / numbness, all in the paraspinals   Objective: Vitals:   06/27/22 0350 06/27/22 0352 06/27/22 0733 06/27/22 0900  BP:  (!) 166/85  133/82  Pulse:  94  98  Resp:  18 12 11   Temp:  98.3 F (36.8 C)    TempSrc:  Oral    SpO2: 97% 97% 97% 97%  Weight:      Height:        Physical Exam: Strength 5/5 x4 and SILTx4  Assessment & Plan: 66 y.o. woman s/p prior T10-pelvis and hardware removal with 3 column fracture at L3.  -we discussed the above, canal violation should cause radicular pain but we discussed that some dural irritation could certainly cause back pain. It's a big dissection and she's neurologically intact without radicular pain, we discussed options - we can take her back to the OR and reposition those screws to take that variable out of the equation or increase her PCA and see if she has better pain control. If a higher dose PCA doesn't help, I think the safest plan forward is to take her back to the OR for a revision. -increase PCA from 0.3 to 0.6mg  demand dose -will re-evaluate in an hour or two after the increased PCA dose has time to work, if no better then we agreed that she should go back to the OR for revision -discussed the issue with not getting paged this morning at ~5am about pain, sounds like they were paging different members of the orthopedics service ([facility] is an ortho floor) hence why I never received a page  79  06/27/22 9:31 AM  Re-evaluated, she is doing much better, pain better controlled, husband is at bedside - pt was able to get up and walk to the bathroom, which she was unable to do preop, discussed the situation with him and reviewed the CT findings with him, he agreed that she's better, can  hold off on any plans for revision right now, can resume her diet.

## 2022-06-27 NOTE — Evaluation (Signed)
Physical Therapy Evaluation Patient Details Name: [name] MRN: [id number] DOB: 1956-04-08 Today's Date: 06/27/2022  History of Present Illness  66 y.o. woman that presents with 2 weeks of severe low back pain after a fall. MRI shows  post-op changes with large fusion mass, fracture through all three columns of L3; s/p OR 12/9 for fracture stbilization  Clinical Impression   Pt admitted with above diagnosis. Lives at home with her husband, in a single-level home with steps to enter; Pt very focused on pain management today, and this PT didn't get much info related to her baseline functional mobility prior to the fall that lead to this admission; In the 2 weeks leading up to this admission, pt depended on her husband for all mobility and ADLs, and it sounds like she was essentially bedbound with pain; Reports today is the first time she has been sitting up and OOB since her fall; Presents to PT with decr functional mobility, pain limiting activity tolerance;  Still, she was able to get up to EOB with 2 person heavy mod assist, and take pivot steps bed to bSC, and BSC back to bed with encouragement and 2 person mod assist; I am hopeful that as her pain gets under control, she will improve, and be able to dc home with husband assist and HHPT follow up; Pt currently with functional limitations due to the deficits listed below (see PT Problem List). Pt will benefit from skilled PT to increase their independence and safety with mobility to allow discharge to the venue listed below.          Recommendations for follow up therapy are one component of a multi-disciplinary discharge planning process, led by the attending physician.  Recommendations may be updated based on patient status, additional functional criteria and insurance authorization.  Follow Up Recommendations Home health PT      Assistance Recommended at Discharge Frequent or constant Supervision/Assistance  Patient can return home  with the following  A lot of help with walking and/or transfers;A lot of help with bathing/dressing/bathroom;Assistance with cooking/housework;Assist for transportation    Equipment Recommendations Rolling walker (2 wheels);BSC/3in1 (I belive pt already has equipment)  Recommendations for Other Services  OT consult    Functional Status Assessment Patient has had a recent decline in their functional status and demonstrates the ability to make significant improvements in function in a reasonable and predictable amount of time.     Precautions / Restrictions Precautions Precautions: Back Precaution Booklet Issued: No Precaution Comments: "no Brace Needed" in order set for 12/10 Restrictions Weight Bearing Restrictions: No      Mobility  Bed Mobility Overal bed mobility: Needs Assistance Bed Mobility: Rolling, Sidelying to Sit, Sit to Sidelying Rolling: Mod assist, +2 for physical assistance, +2 for safety/equipment Sidelying to sit: Mod assist, +2 for physical assistance     Sit to sidelying: Max assist, +2 for physical assistance General bed mobility comments: Pt requesting her significant other help with rolling; Heavy mod assist o f 2 to clear LEs from EOB and elevate trunk to sit; after attemtping to void ion BSC, assisted pt back to bed; 2 person max assist to lay back down    Transfers Overall transfer level: Needs assistance Equipment used: 2 person hand held assist Transfers: Sit to/from Stand, Bed to chair/wheelchair/BSC Sit to Stand: +2 physical assistance, Min assist   Step pivot transfers: +2 physical assistance, Mod assist       General transfer comment: Very anxious, but agreed to  try standing; min assist of 2 to intiate transfer, rise to stand, bilateral support given at elbows; able to take pivot steps bed to [facility], and then BSC back to bed after unsuccessful attempt at voiding on [facility]    Ambulation/Gait                  Stairs             Wheelchair Mobility    Modified Rankin (Stroke Patients Only)       Balance Overall balance assessment: Needs assistance Sitting-balance support: Bilateral upper extremity supported Sitting balance-Leahy Scale: Poor Sitting balance - Comments: Pt very nervous, and requesting assist/support     Standing balance-Leahy Scale: Poor                               Pertinent Vitals/Pain Pain Assessment Pain Assessment: Faces Faces Pain Scale: Hurts worst Pain Descriptors / Indicators: Grimacing, Guarding, Constant Pain Intervention(s): Premedicated before session, Repositioned, PCA encouraged    Home Living Family/patient expects to be discharged to:: Private residence Living Arrangements: Spouse/significant other Available Help at Discharge: Family;Available PRN/intermittently (Will need to verify if close to 24 hour assist) Type of Home: House           Home Equipment: Conservation officer, nature (2 wheels);Rollator (4 wheels);BSC/3in1;Shower seat Additional Comments: Pt very focused on anticipation of pain, and we did not get much opportunity to get info about home setup and available assist    Prior Function Prior Level of Function : Other (comment) (Not a lot of detail re: PLOF before the fall that lead to this admission)             Mobility Comments: Essentially bedbound for teh two weeks after her fall leading to this admission       Hand Dominance   Dominant Hand: Right    Extremity/Trunk Assessment   Upper Extremity Assessment Upper Extremity Assessment: Defer to OT evaluation    Lower Extremity Assessment Lower Extremity Assessment: Generalized weakness    Cervical / Trunk Assessment Cervical / Trunk Assessment: Back Surgery  Communication   Communication: Other (comment) (Very pain-focused,a dn difficulty getting other needed information from pt)  Cognition Arousal/Alertness: Awake/alert Behavior During Therapy: Restless, Anxious Overall  Cognitive Status: Difficult to assess                                 General Comments: Pt very focused on pain and anticipation of pain; Unable to discuss home setup/available home assist        General Comments General comments (skin integrity, edema, etc.): Unable to void on [facility]    Exercises     Assessment/Plan    PT Assessment Patient needs continued PT services  PT Problem List Decreased strength;Decreased range of motion;Decreased activity tolerance;Decreased balance;Decreased mobility;Decreased coordination;Decreased cognition;Decreased knowledge of use of DME;Decreased safety awareness;Decreased knowledge of precautions;Pain       PT Treatment Interventions DME instruction;Gait training;Stair training;Functional mobility training;Therapeutic activities;Therapeutic exercise;Balance training;Patient/family education    PT Goals (Current goals can be found in the Care Plan section)  Acute Rehab PT Goals Patient Stated Goal: Get pain under control PT Goal Formulation: With patient/family Time For Goal Achievement: 07/11/22 Potential to Achieve Goals: Fair    Frequency Min 3X/week     Co-evaluation  AM-PAC PT "6 Clicks" Mobility  Outcome Measure Help needed turning from your back to your side while in a flat bed without using bedrails?: Total Help needed moving from lying on your back to sitting on the side of a flat bed without using bedrails?: Total Help needed moving to and from a bed to a chair (including a wheelchair)?: Total Help needed standing up from a chair using your arms (e.g., wheelchair or bedside chair)?: Total Help needed to walk in hospital room?: Total Help needed climbing 3-5 steps with a railing? : Total 6 Click Score: 6    End of Session   Activity Tolerance: Patient limited by pain Patient left: in bed;with call bell/phone within reach;with bed alarm set;with family/visitor present Nurse Communication: Mobility  status PT Visit Diagnosis: Unsteadiness on feet (R26.81);Muscle weakness (generalized) (M62.81);Pain Pain - part of body:  (Back pian)    Time: 8115-7262 PT Time Calculation (min) (ACUTE ONLY): 28 min   Charges:   PT Evaluation $PT Eval Moderate Complexity: 1 Mod PT Treatments $Therapeutic Activity: 8-22 mins        Van Clines, PT  Acute Rehabilitation Services Office [phone]   Levi Aland 06/27/2022, 2:02 PM

## 2022-06-27 NOTE — Progress Notes (Signed)
   06/27/22 1125  Assess: MEWS Score  Temp 98.3 F (36.8 C)  BP (!) 84/54  MAP (mmHg) (!) 63  Pulse Rate 93  Resp (!) 9  Level of Consciousness Responds to Voice  SpO2 94 %  O2 Device Nasal Cannula  Patient Activity (if Appropriate) In bed  O2 Flow Rate (L/min) 2 L/min  Assess: MEWS Score  MEWS Temp 0  MEWS Systolic 1  MEWS Pulse 0  MEWS RR 1  MEWS LOC 1  MEWS Score 3  MEWS Score Color Yellow  Assess: if the MEWS score is Yellow or Red  Were vital signs taken at a resting state? Yes  Focused Assessment Change from prior assessment (see assessment flowsheet)  Does the patient meet 2 or more of the SIRS criteria? No  MEWS guidelines implemented *See Row Information* Yes  Treat  MEWS Interventions Other (Comment) (Dr. Maurice Small at bedside, continue to monitor)  Pain Scale 0-10  Pain Score 5  Faces Pain Scale 0  Pain Type Acute pain;Surgical pain  Pain Location Back  Pain Orientation Mid;Lower  Pain Radiating Towards N/A  Provider Notification  Provider Name/Title Dr. Maurice Small  Date Provider Notified 06/27/22  Time Provider Notified 1123  Method of Notification Face-to-face  Notification Reason Change in status  Provider response At bedside;No new orders  Assess: SIRS CRITERIA  SIRS Temperature  0  SIRS Pulse 1  SIRS Respirations  0  SIRS WBC 1  SIRS Score Sum  2   M.D. Autumn Patty updated at 1211. Orders to decrease PCA demand dose and begin continuous cardiac and SpO2 monitoring. Patient stabilized. Will continue to monitor. Care also managed by charge nurse Herbert Seta, RN.

## 2022-06-27 NOTE — Progress Notes (Signed)
Paged neurosurgical on call due to no pain medication being listed on the [facility] post-surgery. Provider was not forthcoming when pain med requested. PCA Dilaudid 1mg  was added to [facility] and pt received. Pt still 10/10 pain and unable to sleep during shift. Gave Tylenol and Robaxin in addition but pt is still vocal with pain.

## 2022-06-28 ENCOUNTER — Encounter (HOSPITAL_COMMUNITY): Payer: Self-pay

## 2022-06-28 ENCOUNTER — Ambulatory Visit (HOSPITAL_COMMUNITY)
Admission: RE | Admit: 2022-06-28 | Discharge: 2022-06-28 | Disposition: A | Discharge status: Home / Self Care | Payer: Medicare Other | Source: Ambulatory Visit | Attending: Orthopaedic Surgery | Admitting: Orthopaedic Surgery

## 2022-06-28 MED ORDER — HEPARIN SODIUM (PORCINE) 5000 UNIT/ML IJ SOLN
5000.0000 [IU] | Freq: Three times a day (TID) | INTRAMUSCULAR | Status: DC
  Administered 2022-06-29 – 2022-07-08 (×26): 5000 [IU] via SUBCUTANEOUS
  Filled 2022-06-28 (×26): qty 1

## 2022-06-28 MED ORDER — HYDROMORPHONE 1 MG/ML IV SOLN
INTRAVENOUS | Status: DC
  Administered 2022-06-28: 2.4 mg via INTRAVENOUS
  Administered 2022-06-28: 3.2 mg via INTRAVENOUS
  Administered 2022-06-28: 5.6 mg via INTRAVENOUS
  Administered 2022-06-29: 0.8 mg via INTRAVENOUS
  Administered 2022-06-29: 30 mg via INTRAVENOUS
  Administered 2022-06-30: 0.4 mg via INTRAVENOUS
  Administered 2022-06-30: 1.8 mg via INTRAVENOUS
  Filled 2022-06-28: qty 30

## 2022-06-28 MED ORDER — CALCITONIN (SALMON) 200 UNIT/ACT NA SOLN
1.0000 | Freq: Every day | NASAL | Status: DC
  Administered 2022-06-28 – 2022-07-08 (×11): 1 via NASAL
  Filled 2022-06-28: qty 3.7

## 2022-06-28 NOTE — Progress Notes (Signed)
Neurosurgery Service Progress Note  Subjective: No acute events overnight, back pain sig improved from yesterday, no radicular pain / weakness / numbness   Objective: Vitals:   06/27/22 2300 06/28/22 0434 06/28/22 0454 06/28/22 0724  BP:   (!) 141/99 (!) 161/90  Pulse:   (!) 101 (!) 103  Resp: 13 15 13    Temp:   98.2 F (36.8 C) 98.5 F (36.9 C)  TempSrc:   Oral Oral  SpO2: 95% 97% 94% 98%  Weight:      Height:        Physical Exam: Strength 5/5 x4 and SILTx4   Assessment & Plan: 66 y.o. woman s/p fracture through prior fusion mass s/p PSIF.  -decrease hydromorph PCA 0.4 --> 0.2 -activity as tolerated -start calcitonin -SCDs/TEDs/SQH tomorrow  79  06/28/22 8:20 AM

## 2022-06-28 NOTE — Progress Notes (Signed)
Pt refused to use bed pan, stated it was uncomfortable. Patient used towel and urinated in the towel and slung towel over side rail.  Patient then used top sheet in same fashion.

## 2022-06-28 NOTE — Progress Notes (Signed)
  Patient has small urinary output. Writer suggested patient gets up and walk to bathroom.  Patient refused to get out of bed to use bathroom or sit on bedside commode.  Patient states that she is not supposed to get out of bed which is contrary to orders. Writer expressed to patient the importance and benefits of moving after undergoing surgery.

## 2022-06-28 NOTE — Progress Notes (Signed)
Pt is having trouble keeping up with the pca control.  Pt has been requesting pain med without pressing button.  Writer has gone into patient's room hourly due to patient's request for pain med.  Each time patient has to be reminded to locate and press button only when light is green.

## 2022-06-28 NOTE — Progress Notes (Signed)
Mobility Specialist Progress Note   06/28/22 0945  Mobility  Activity Contraindicated/medical hold   Pt not appropriate for mobility at this time given level of complexity, physical assist, and/or precautions as advised by RN. Patient with intolerable and unresolved 10/10 pain. MS to hold today and will continue to follow.  [place] Tecla Mailloux, BS EXP Mobility Specialist Please contact via SecureChat or Rehab office at [phone]

## 2022-06-28 NOTE — Progress Notes (Signed)
Physical Therapy Treatment Patient Details Name: [name] MRN: [id number] DOB: [birth year] Today's Date: 06/28/2022   History of Present Illness 66 y.o. woman that presents with 2 weeks of severe low back pain after a fall. MRI shows  post-op changes with large fusion mass, fracture through all three columns of L3; s/p OR 12/9 for fracture stabilization    PT Comments    Pt continues to be limited due to pain and fear of pain. Pt is demonstrating some learned helplessness as demonstrated by pt stating that her spouse gives her more support at her trunk when pt did not need any further support at the trunk when sitting EOB. Pt was receptive to education and was able to improve posture/sitting balance with education on the importance of not receiving more assist than needed. Pt was able to stand at EOB 2x initially at Mod A progressing to 2 Min a with 2 person assist due to nervousness and multiple lines. Pt has equipment and was receiving assistance at home prior to hospitalization. Pt will benefit from continued skilled physical therapy services at home following discharge from acute care hospital setting as long as she is able to get the physical assistance required for mobility/transfers in order to decrease risk for re-hospitalization and decrease need for physical assistance. Pt demonstrates no signs/symptoms of cardiac/respiratory distress during treatment session.   Recommendations for follow up therapy are one component of a multi-disciplinary discharge planning process, led by the attending physician.  Recommendations may be updated based on patient status, additional functional criteria and insurance authorization.  Follow Up Recommendations  Home health PT     Assistance Recommended at Discharge Frequent or constant Supervision/Assistance  Patient can return home with the following A lot of help with walking and/or transfers;A lot of help with  bathing/dressing/bathroom;Assistance with cooking/housework;Assist for transportation   Equipment Recommendations  Rolling walker (2 wheels);BSC/3in1    Recommendations for Other Services       Precautions / Restrictions Precautions Precautions: Back Precaution Booklet Issued: No Precaution Comments: "no Brace Needed" in order set for 12/10 Restrictions Weight Bearing Restrictions: No     Mobility  Bed Mobility Overal bed mobility: Needs Assistance Bed Mobility: Rolling, Sidelying to Sit, Sit to Sidelying Rolling: Mod assist, +2 for physical assistance, +2 for safety/equipment Sidelying to sit: Mod assist, +2 for physical assistance     Sit to sidelying: Max assist, +2 for physical assistance General bed mobility comments: Pt is a Mod A at the Trunk with heavy encoruagement to use bil UE with intermittent help from patient and then not throughout activity. Heavier assist from sitting to supine Patient Response: Anxious  Transfers Overall transfer level: Needs assistance Equipment used: 2 person hand held assist Transfers: Sit to/from Stand Sit to Stand: +2 physical assistance, Mod assist, Min assist           General transfer comment: Initially Mod A for first sit to stand, Min A for second sit to stand from EOB with RW    Ambulation/Gait       General Gait Details: Pt unable to progress to gait today       Balance Overall balance assessment: Needs assistance Sitting-balance support: Bilateral upper extremity supported Sitting balance-Leahy Scale: Poor Sitting balance - Comments: Pt very nervous, and requesting assist/support. Pt was able to straighten herself and sit EOB without assistance when encouraged. Pt states that her spouse gives her more support but pt was educated that she can have the support she needs  but to try to use core musculature which pt stated understanding and have improved balance without assistance. Postural control: Posterior lean,  Right lateral lean Standing balance support: Reliant on assistive device for balance, Bilateral upper extremity supported Standing balance-Leahy Scale: Poor Standing balance comment: Pt requires 2+ person assist to maintain balance while standing with RW                            Cognition Arousal/Alertness: Awake/alert Behavior During Therapy: Restless, Anxious Overall Cognitive Status: Difficult to assess        General Comments: Pt very focused on pain and anticipation of pain, intermittently able to divert attention               Pertinent Vitals/Pain Pain Assessment Pain Assessment: Faces Faces Pain Scale: Hurts even more Breathing: normal Negative Vocalization: occasional moan/groan, low speech, negative/disapproving quality Facial Expression: facial grimacing Body Language: tense, distressed pacing, fidgeting Consolability: no need to console PAINAD Score: 4 Pain Descriptors / Indicators: Grimacing, Guarding, Constant Pain Intervention(s): PCA encouraged, Repositioned, Monitored during session, Limited activity within patient's tolerance     PT Goals (current goals can now be found in the care plan section) Acute Rehab PT Goals Patient Stated Goal: Get pain under control PT Goal Formulation: With patient/family Time For Goal Achievement: 07/11/22 Potential to Achieve Goals: Fair Progress towards PT goals: Progressing toward goals    Frequency    Min 3X/week      PT Plan Current plan remains appropriate       AM-PAC PT "6 Clicks" Mobility   Outcome Measure  Help needed turning from your back to your side while in a flat bed without using bedrails?: Total Help needed moving from lying on your back to sitting on the side of a flat bed without using bedrails?: Total Help needed moving to and from a bed to a chair (including a wheelchair)?: Total Help needed standing up from a chair using your arms (e.g., wheelchair or bedside chair)?:  Total Help needed to walk in hospital room?: Total Help needed climbing 3-5 steps with a railing? : Total 6 Click Score: 6    End of Session Equipment Utilized During Treatment: Other (comment) (no gait belt due to position of pt incision. Stayed at EOB 2 person assist) Activity Tolerance: Patient limited by pain Patient left: in bed;with call bell/phone within reach;with bed alarm set Nurse Communication: Mobility status PT Visit Diagnosis: Unsteadiness on feet (R26.81);Muscle weakness (generalized) (M62.81);Pain Pain - part of body:  (back pain)     Time: 1610-9604 PT Time Calculation (min) (ACUTE ONLY): 38 min  Charges:  $Therapeutic Activity: 38-52 mins                     Harrel Carina, DPT, CLT  Acute Rehabilitation Services Office: [phone] (Secure chat preferred)    Claudia Desanctis 06/28/2022, 4:39 PM

## 2022-06-29 NOTE — Progress Notes (Signed)
Patient has been encouraged a number of times today to sit up and stand.  Patient has refused mobility due to fear of inability to cope with pain given the uneven length of her legs.  Assisted patient to roll on side, skin checked, bed pad clean and dry.  Tolerated rolling from side to side well.

## 2022-06-29 NOTE — Progress Notes (Signed)
I went to remove Purewick this morning and to do CBG bath patient stated "I don't want to deal with this right now. Just leave it until later".  Tried to educate patient regarding moving and even just using the bed pan but she was adamant to keep Purewick longer.  Will have day shift to follow up.

## 2022-06-29 NOTE — Care Management Important Message (Signed)
Important Message  Patient Details  Name: [name] MRN: [id number] Date of Birth: 04-16-1956   Medicare Important Message Given:  Yes     Sherilyn Banker 06/29/2022, 11:52 AM

## 2022-06-29 NOTE — Progress Notes (Signed)
Physical Therapy Treatment Patient Details Name: [name] MRN: [id number] DOB: [birth year] Today's Date: 06/29/2022   History of Present Illness 66 y.o. woman that presents with 2 weeks of severe low back pain after a fall. MRI shows  post-op changes with large fusion mass, fracture through all three columns of L3; s/p OR 12/9 for fracture stabilization    PT Comments    Pt greeted supine in bed and agreeable to session with max encouragement and education on importance of mobility. Pt continues to be most limited by pain and fear of pain. With encouragement and max reassurance pt able to roll to L with min assist and come to siting EOB with mod assist, pt needing cues on rise from sidelying to push with UE from rail and not pull as pt resisting coming to sit at start. Pt able to maintain sitting balance at EOB without physical assist with encouragement and reassurance. Pt declining transfer training despite max encouragement, education and reassurance and pt abruptly returning to supine with min guard, min assist needed to reposition in supine. RN present at start of session and educating pt throughout on PCA use. Pt HR stable throughout all mobility, and pt with no signs/symptoms of respiratory distress throughout. Pt continues to benefit from skilled PT services to progress toward functional mobility goals.    Recommendations for follow up therapy are one component of a multi-disciplinary discharge planning process, led by the attending physician.  Recommendations may be updated based on patient status, additional functional criteria and insurance authorization.  Follow Up Recommendations  Home health PT     Assistance Recommended at Discharge Frequent or constant Supervision/Assistance  Patient can return home with the following A lot of help with walking and/or transfers;A lot of help with bathing/dressing/bathroom;Assistance with cooking/housework;Assist for transportation    Equipment Recommendations  Rolling walker (2 wheels);BSC/3in1    Recommendations for Other Services       Precautions / Restrictions Precautions Precautions: Back Precaution Booklet Issued: No Precaution Comments: "no Brace Needed" in order set for 12/10 Restrictions Weight Bearing Restrictions: No     Mobility  Bed Mobility Overal bed mobility: Needs Assistance Bed Mobility: Rolling, Sidelying to Sit, Sit to Sidelying Rolling: Min assist Sidelying to sit: Mod assist     Sit to sidelying: Min guard General bed mobility comments: min assist to roll to L, mod assist to elevate trunk as pt resisting trunk elevation with UE on bed rail needing cues to let go and push not pull, min guard to return to supine as pt refusing to trial standing    Transfers Overall transfer level: Needs assistance                 General transfer comment: pt refusing and lying back down abruptly    Ambulation/Gait                   Stairs             Wheelchair Mobility    Modified Rankin (Stroke Patients Only)       Balance Overall balance assessment: Needs assistance Sitting-balance support: Bilateral upper extremity supported Sitting balance-Leahy Scale: Poor Sitting balance - Comments: Pt very nervous, and requesting assist/support. Pt was able to straighten herself and sit EOB without assistance when encouraged.  Cognition Arousal/Alertness: Awake/alert Behavior During Therapy: Restless, Anxious Overall Cognitive Status: Difficult to assess                                 General Comments: Pt very focused on pain and anticipation of pain, intermittently able to divert attention        Exercises      General Comments        Pertinent Vitals/Pain Pain Assessment Pain Assessment: Faces Faces Pain Scale: Hurts even more Pain Descriptors / Indicators: Grimacing, Guarding, Constant,  Crying Pain Intervention(s): Monitored during session, Limited activity within patient's tolerance, Premedicated before session, RN gave pain meds during session, PCA encouraged    Home Living                          Prior Function            PT Goals (current goals can now be found in the care plan section) Acute Rehab PT Goals PT Goal Formulation: With patient/family Time For Goal Achievement: 07/11/22 Progress towards PT goals: Not progressing toward goals - comment (pain and self limiting behavior)    Frequency    Min 3X/week      PT Plan Current plan remains appropriate    Co-evaluation              AM-PAC PT "6 Clicks" Mobility   Outcome Measure  Help needed turning from your back to your side while in a flat bed without using bedrails?: Total Help needed moving from lying on your back to sitting on the side of a flat bed without using bedrails?: A Lot Help needed moving to and from a bed to a chair (including a wheelchair)?: Total Help needed standing up from a chair using your arms (e.g., wheelchair or bedside chair)?: Total Help needed to walk in hospital room?: Total Help needed climbing 3-5 steps with a railing? : Total 6 Click Score: 7    End of Session   Activity Tolerance: Patient limited by pain;Other (comment) (self limiting) Patient left: in bed;with call bell/phone within reach;with bed alarm set Nurse Communication: Mobility status PT Visit Diagnosis: Unsteadiness on feet (R26.81);Muscle weakness (generalized) (M62.81);Pain Pain - part of body:  (back pain)     Time: 8250-5397 PT Time Calculation (min) (ACUTE ONLY): 23 min  Charges:  $Therapeutic Activity: 23-37 mins                     Khaniyah Bezek R. PTA Acute Rehabilitation Services Office: [phone]    Catalina Antigua 06/29/2022, 12:19 PM

## 2022-06-29 NOTE — Progress Notes (Signed)
Neurosurgery Service Progress Note  Subjective: No acute events overnight, she feels better this morning, back pain improving, no leg pain  Objective: Vitals:   06/29/22 0325 06/29/22 0434 06/29/22 0622 06/29/22 0700  BP:  (!) 145/89    Pulse:  99    Resp: 16 11 16    Temp:    98.5 F (36.9 C)  TempSrc:    Oral  SpO2: 98% 96% 98%   Weight:      Height:        Physical Exam: Strength 5/5 x4 and SILTx4   Assessment & Plan: 66 y.o. woman s/p fracture through prior fusion mass s/p PSIF.  -activity as tolerated -calcitonin -discussed options regarding revising that screw, she'd like to see how she does with PT today and decide about revision or not -SCDs/TEDs/SQH  79  06/29/22 8:12 AM

## 2022-06-30 ENCOUNTER — Encounter (HOSPITAL_COMMUNITY): Payer: Self-pay | Admitting: Neurological Surgery

## 2022-06-30 MED ORDER — OXYCODONE HCL 5 MG PO TABS
10.0000 mg | ORAL_TABLET | ORAL | Status: DC | PRN
  Administered 2022-06-30 – 2022-07-04 (×3): 10 mg via ORAL
  Filled 2022-06-30 (×2): qty 2

## 2022-06-30 MED ORDER — HYDROMORPHONE HCL 1 MG/ML IJ SOLN
1.0000 mg | INTRAMUSCULAR | Status: DC | PRN
  Administered 2022-06-30 – 2022-07-06 (×22): 1 mg via INTRAVENOUS
  Filled 2022-06-30 (×24): qty 1

## 2022-06-30 MED ORDER — OXYCODONE HCL 5 MG PO TABS
15.0000 mg | ORAL_TABLET | ORAL | Status: DC | PRN
  Administered 2022-06-30 – 2022-07-08 (×26): 15 mg via ORAL
  Filled 2022-06-30 (×27): qty 3

## 2022-06-30 MED ORDER — OXYCODONE HCL 5 MG PO TABS: 5.0000 mg | ORAL_TABLET | ORAL | Status: DC | PRN

## 2022-06-30 NOTE — Progress Notes (Addendum)
Neurosurgery Service Progress Note  Subjective: No acute events overnight, back pain improving, no leg pain, mobizling more but anxious about pain  Objective: Vitals:   06/30/22 0052 06/30/22 0600 06/30/22 0824 06/30/22 0832  BP:    (!) 161/84  Pulse:      Resp: 18 18 14    Temp:      TempSrc:      SpO2: 98% 96% 97%   Weight:      Height:        Physical Exam: Strength 5/5 x4 and SILTx4   Assessment & Plan: 66 y.o. woman s/p fracture through prior fusion mass s/p PSIF.  -activity as tolerated -calcitonin -d/c PCA and transition to oral Rx -PT/OT rec home PTOT -d/c tele status -SCDs/TEDs/SQH  79  06/30/22 9:57 AM

## 2022-06-30 NOTE — Progress Notes (Signed)
Physical Therapy Treatment Patient Details Name: [name] MRN: [id number] DOB: 24-Jul-1955 Today's Date: 06/30/2022   History of Present Illness 66 y.o. woman that presents with 2 weeks of severe low back pain after a fall. MRI shows  post-op changes with large fusion mass, fracture through all three columns of L3; s/p OR 12/9 for fracture stabilization    PT Comments    Received pt semi-reclined in bed and reluctantly agreeable to PT. Pt extremely anxious, fearful of movement, self-limiting, and limited by pain in back throughout session. Pt required min A, max cues, and increased time to roll onto R side. Then transferred R sidelying<>sitting EOB with max A as pt pushing posteriorly and too focused on fear/pain to attend to cues. Pt required max A for sitting balance due to posterior lean but was able to maintain sitting balance with as little as min guard very briefly. Unable to progress to standing due to fear and strong posterior lean, with pt actively resisting therapist with attempts for anterior weight shifting. Returned to supine with max A and scooted to [facility] with max A and use of Trendelenburg bed position. Acute PT to cont to follow.     Recommendations for follow up therapy are one component of a multi-disciplinary discharge planning process, led by the attending physician.  Recommendations may be updated based on patient status, additional functional criteria and insurance authorization.  Follow Up Recommendations  Skilled nursing-short term rehab (<3 hours/day)     Assistance Recommended at Discharge Frequent or constant Supervision/Assistance  Patient can return home with the following A lot of help with bathing/dressing/bathroom;Assistance with cooking/housework;Assist for transportation;Two people to help with walking and/or transfers;Direct supervision/assist for medications management;Help with stairs or ramp for entrance   Equipment Recommendations  Rolling walker  (2 wheels);BSC/3in1    Recommendations for Other Services OT consult     Precautions / Restrictions Precautions Precautions: Back Precaution Comments: "no Brace Needed" in order set for 12/10 Restrictions Weight Bearing Restrictions: No     Mobility  Bed Mobility Overal bed mobility: Needs Assistance Bed Mobility: Rolling, Sit to Supine, Sidelying to Sit Rolling: Min assist Sidelying to sit: Max assist   Sit to supine: Max assist   General bed mobility comments: HOB elevated to sit EOB. Pt required significantly increased time and assist for BLE management and trunk control. pt with poor carry over of cues for logroll technique and pushing posteriorly due to fear of falling forwards Patient Response: Anxious  Transfers                   General transfer comment: pt unable to obtain enough anterior weight shift to safely transfer into standing    Ambulation/Gait               General Gait Details: unable due to pain/fear of movement   Stairs             Wheelchair Mobility    Modified Rankin (Stroke Patients Only)       Balance Overall balance assessment: Needs assistance Sitting-balance support: Bilateral upper extremity supported, Feet supported Sitting balance-Leahy Scale: Zero Sitting balance - Comments: pt extremly anxious and required up to max A for sitting balance as pt pushing posteriorly and constantly stating "hold me up" and "don't let me fall". However pt able to engage core enough tocome upright and maintain balance with min guard and maintain for ~5 second intervals Postural control: Posterior lean  Cognition Arousal/Alertness: Awake/alert Behavior During Therapy: Restless, Anxious Overall Cognitive Status: Difficult to assess                                 General Comments: pt hyperfocused on pain and anticipation of pain and extremly limited by fear of movement.  Pt required repetitive cues for technique with bed mobility        Exercises      General Comments General comments (skin integrity, edema, etc.): pt extremly slow to move, very particular, and with difficulty following cues      Pertinent Vitals/Pain Pain Assessment Pain Assessment: 0-10 Pain Score: 4  Pain Location: back Pain Descriptors / Indicators: Grimacing, Guarding, Constant, Crying, Discomfort, Moaning Pain Intervention(s): Limited activity within patient's tolerance, Monitored during session, Premedicated before session, Repositioned    Home Living                          Prior Function            PT Goals (current goals can now be found in the care plan section) Acute Rehab PT Goals Patient Stated Goal: Get pain under control PT Goal Formulation: With patient/family Time For Goal Achievement: 07/11/22 Potential to Achieve Goals: Fair Progress towards PT goals: Not progressing toward goals - comment (pain and self-limiting - fearful of movement)    Frequency    Min 3X/week      PT Plan Current plan remains appropriate    Co-evaluation              AM-PAC PT "6 Clicks" Mobility   Outcome Measure  Help needed turning from your back to your side while in a flat bed without using bedrails?: Total Help needed moving from lying on your back to sitting on the side of a flat bed without using bedrails?: Total Help needed moving to and from a bed to a chair (including a wheelchair)?: Total Help needed standing up from a chair using your arms (e.g., wheelchair or bedside chair)?: Total Help needed to walk in hospital room?: Total Help needed climbing 3-5 steps with a railing? : Total 6 Click Score: 6    End of Session   Activity Tolerance: Patient limited by pain;Other (comment) (self-limiting) Patient left: in bed;with call bell/phone within reach;with bed alarm set Nurse Communication: Mobility status PT Visit Diagnosis: Unsteadiness on  feet (R26.81);Muscle weakness (generalized) (M62.81);Pain Pain - Right/Left:  (back) Pain - part of body:  (back)     Time: 8588-5027 PT Time Calculation (min) (ACUTE ONLY): 21 min  Charges:  $Therapeutic Activity: 8-22 mins                    Raechel Chute PT, DPT  Alfonso Patten 06/30/2022, 12:24 PM

## 2022-07-01 NOTE — TOC Initial Note (Signed)
Transition of Care [facility]) - Initial/Assessment Note    Patient Details  Name: [name] MRN: [id number] Date of Birth: [birth year]  Transition [facility]) CM/SW Contact:    Joanne Chars, LCSW Phone Number: 07/01/2022, 3:16 PM  Clinical Narrative:   CSW asked to speak with pt husband, met with pt and husband Burt Knack in room. Permission given to speak with Burt Knack.  Pt currently recommended for HH, [place] not able to provide enough help at home, asking that pt be reconsidered for SNF.  Pt lives at home with Burt Knack, no current services.  Pt is vaccinated for covid with boosters.  CSW spoke with PT   Vicente Males, discussed the above and she is in agreement with plan for SNF.  Informed [place], pt sent out for SNF.             Expected Discharge Plan: Skilled Nursing Facility Barriers to Discharge: Continued Medical Work up, SNF Pending bed offer   Patient Goals and CMS Choice Patient states their goals for this hospitalization and ongoing recovery are:: walk again CMS Medicare.gov Compare Post Acute Care list provided to:: Patient Choice offered to / list presented to : Patient  Expected Discharge Plan and Services Expected Discharge Plan: [place] In-house Referral: Clinical Social Work   Post Acute Care Choice: [place] Living arrangements for the past 2 months: [place]                                      Prior Living Arrangements/Services Living arrangements for the past 2 months: Single Family Home Lives with:: Spouse Patient language and need for interpreter reviewed:: Yes Do you feel safe going back to the place where you live?: Yes      Need for Family Participation in Patient Care: Yes (Comment) Care giver support system in place?: Yes (comment) Current home services: Other (comment) (none) Criminal Activity/Legal Involvement Pertinent to Current Situation/Hospitalization: No - Comment as needed  Activities  of Daily Living Home Assistive Devices/Equipment: Walker (specify type) (Rollator) ADL Screening (condition at time of admission) Patient's cognitive ability adequate to safely complete daily activities?: Yes Is the patient deaf or have difficulty hearing?: No Does the patient have difficulty seeing, even when wearing glasses/contacts?: No Does the patient have difficulty concentrating, remembering, or making decisions?: No Patient able to express need for assistance with ADLs?: Yes Does the patient have difficulty dressing or bathing?: Yes Independently performs ADLs?: No Communication: Independent Dressing (OT): Needs assistance Is this a change from baseline?: Change from baseline, expected to last >3 days Grooming: Needs assistance Is this a change from baseline?: Change from baseline, expected to last >3 days Feeding: Independent Bathing: Needs assistance Is this a change from baseline?: Change from baseline, expected to last >3 days Toileting: Needs assistance Is this a change from baseline?: Change from baseline, expected to last >3days In/Out Bed: Needs assistance Is this a change from baseline?: Change from baseline, expected to last >3 days Walks in Home: Needs assistance Is this a change from baseline?: Change from baseline, expected to last >3 days Does the patient have difficulty walking or climbing stairs?: Yes Weakness of Legs: None Weakness of Arms/Hands: None  Permission Sought/Granted Permission sought to share information with : Family Supports Permission granted to share information with : Yes, Verbal Permission Granted  Share Information with NAME: husband cooper  Permission granted to share  info w AGENCY: SNF        Emotional Assessment Appearance:: Appears stated age Attitude/Demeanor/Rapport: Engaged Affect (typically observed): Appropriate, Pleasant Orientation: : Oriented to Self, Oriented to Place, Oriented to  Time, Oriented to Situation       Admission diagnosis:  Lumbar vertebral fracture (HCC) [S32.009A] Fracture of lumbar spine without cord injury ([place]) [S32.009A] Other closed fracture of third lumbar vertebra, initial encounter ([place]) [S32.038A] Patient Active Problem List   Diagnosis Date Noted   Fracture of lumbar spine without cord injury ([place]) 06/26/2022   Lumbar vertebral fracture ([place]) 06/25/2022   Mood disorder ([place]) 09/29/2021   Mild cognitive impairment 09/06/2018   Hyponatremia 02/28/2018   S/P shoulder replacement, left 08/19/2016   Anxiety 10/17/2014   Acid reflux 10/17/2014   BP (high blood pressure) 10/17/2014   Arthritis, degenerative 10/17/2014   Adult hypothyroidism 10/17/2014   UTI (urinary tract infection) 06/03/2014   Fracture of bone adjacent to prosthesis 06/03/2014   Diabetes ([place]) 06/02/2014   Peri-prosthetic fracture of femur following total hip arthroplasty 06/02/2014   Bladder retention 05/13/2014   Chronic pain 05/10/2014   History of hip surgery 05/10/2014   UNSPECIFIED HEART FAILURE 06/24/2010   UNSPECIFIED CONGENITAL DEFECT OF SEPTAL CLOSURE 06/24/2010   TOBACCO ABUSE 03/18/2010   Secondary cardiomyopathy ([place]) 03/18/2010   DM 06/13/2009   HYPERTENSION, UNSPECIFIED 06/13/2009   VENTRICULAR TACHYCARDIA 06/13/2009   VENTRICULAR SEPTAL DEFECT, CONGENITAL 06/13/2009   Diabetes mellitus, type 2 ([place]) 06/13/2009   Essential (primary) hypertension 06/13/2009   PCP:  [name], MD Pharmacy:   [facility] [place], [place] - [address] AT [place] [address] [place] [address] Phone: [phone] Fax: [phone]  [facility] [place], [place] AT [address] [place] [address] Phone: [phone] Fax: [phone]     Social Determinants of Health (SDOH) Interventions    Readmission Risk Interventions     No data to display

## 2022-07-01 NOTE — Plan of Care (Signed)
  Problem: Education: Goal: Ability to verbalize activity precautions or restrictions will improve Outcome: Progressing Goal: Knowledge of the prescribed therapeutic regimen will improve Outcome: Progressing Goal: Understanding of discharge needs will improve Outcome: Progressing   

## 2022-07-01 NOTE — Progress Notes (Signed)
Pt didn't want to take heparin shot at this time.  Pt wants to have a bowel movement. Offered PRN, but refused at this time.

## 2022-07-01 NOTE — Progress Notes (Signed)
Mobility Specialist Progress Note   07/01/22 1225  Mobility  Activity Dangled on edge of bed  Level of Assistance Moderate assist, patient does 50-74%  Assistive Device Other (Comment) (HHA)  Activity Response Tolerated fair  $Mobility charge 1 Mobility   Pt found in bed c/o back pain(6/10) but agreeable. Pt anxious throughout session limiting progression. ModA to get EOB but minA once pt sitting upright, using MS hands to steady self. Practice anterior + lateral leaning for ~67mins then returned pt back supine d/t increased back pain. Left supine w/ call bell in reach and RN notified about pain.   Frederico Hamman Mobility Specialist Please contact via SecureChat or  Rehab office at [phone]

## 2022-07-01 NOTE — NC FL2 (Signed)
[place] LEVEL OF CARE FORM     IDENTIFICATION  Patient Name: NEITA POSTEN Birthdate: 1955/08/21 Sex: female Admission Date (Current Location): 06/25/2022  [facility] and [place] Number:  Herbalist and Address:  [facility] [place], [place] [address], [place], [place] [address]      Provider Number: [id number]  Attending Physician Name and Address:  Judith Part, MD  Relative Name and Phone Number:  Woolman,Cooper Spouse [phone]    Current Level of Care: Hospital Recommended Level of Care: [place] Prior Approval Number:    Date Approved/Denied:   PASRR Number: [id number]  Discharge Plan: SNF    Current Diagnoses: Patient Active Problem List   Diagnosis Date Noted   Fracture of lumbar spine without cord injury ([place]) 06/26/2022   Lumbar vertebral fracture ([place]) 06/25/2022   Mood disorder ([place]) 09/29/2021   Mild cognitive impairment 09/06/2018   Hyponatremia 02/28/2018   S/P shoulder replacement, left 08/19/2016   Anxiety 10/17/2014   Acid reflux 10/17/2014   BP (high blood pressure) 10/17/2014   Arthritis, degenerative 10/17/2014   Adult hypothyroidism 10/17/2014   UTI (urinary tract infection) 06/03/2014   Fracture of bone adjacent to prosthesis 06/03/2014   Diabetes ([place]) 06/02/2014   Peri-prosthetic fracture of femur following total hip arthroplasty 06/02/2014   Bladder retention 05/13/2014   Chronic pain 05/10/2014   History of hip surgery 05/10/2014   UNSPECIFIED HEART FAILURE 06/24/2010   UNSPECIFIED CONGENITAL DEFECT OF SEPTAL CLOSURE 06/24/2010   TOBACCO ABUSE 03/18/2010   Secondary cardiomyopathy ([place]) 03/18/2010   DM 06/13/2009   HYPERTENSION, UNSPECIFIED 06/13/2009   VENTRICULAR TACHYCARDIA 06/13/2009   VENTRICULAR SEPTAL DEFECT, CONGENITAL 06/13/2009   Diabetes mellitus, type 2 ([place]) 06/13/2009   Essential (primary) hypertension 06/13/2009    Orientation  RESPIRATION BLADDER Height & Weight     Self, Time, Situation, Place  Normal Continent, External catheter Weight: 160 lb 0.1 oz (72.6 kg) Height:  5' 0.98" (154.9 cm)  BEHAVIORAL SYMPTOMS/MOOD NEUROLOGICAL BOWEL NUTRITION STATUS      Continent Diet (see discharge summary)  AMBULATORY STATUS COMMUNICATION OF NEEDS Skin   Total Care Verbally Normal                       Personal Care Assistance Level of Assistance  Bathing, Feeding, Dressing Bathing Assistance: Maximum assistance Feeding assistance: Limited assistance Dressing Assistance: Maximum assistance     Functional Limitations Info  Sight, Hearing, Speech Sight Info: Adequate Hearing Info: Adequate Speech Info: Adequate    SPECIAL CARE FACTORS FREQUENCY  PT (By licensed PT), OT (By licensed OT)     PT Frequency: 5x week OT Frequency: 5x week            Contractures Contractures Info: Not present    Additional Factors Info  Code Status, Allergies Code Status Info: full Allergies Info: NKA           Current Medications (07/01/2022):  This is the current hospital active medication list Current Facility-Administered Medications  Medication Dose Route Frequency Provider Last Rate Last Admin   0.9 %  sodium chloride infusion   Intravenous Continuous Judith Part, MD   Stopped at 06/30/22 1658   acetaminophen (TYLENOL) tablet 650 mg  650 mg Oral Q4H PRN Judith Part, MD   650 mg at 06/27/22 0445   Or   acetaminophen (TYLENOL) suppository 650 mg  650 mg Rectal Q4H PRN Judith Part, MD  amLODipine (NORVASC) tablet 5 mg  5 mg Oral q AM Jadene Pierini, MD   5 mg at 07/01/22 0839   buPROPion (WELLBUTRIN XL) 24 hr tablet 300 mg  300 mg Oral Daily Jadene Pierini, MD   300 mg at 07/01/22 0839   calcitonin (salmon) (MIACALCIN/FORTICAL) nasal spray 1 spray  1 spray Alternating Nares Daily Jadene Pierini, MD   1 spray at 07/01/22 0842   carvedilol (COREG) tablet 6.25 mg  6.25 mg  Oral BID WC Jadene Pierini, MD   6.25 mg at 07/01/22 0839   Chlorhexidine Gluconate Cloth 2 % PADS 6 each  6 each Topical Q0600 Jadene Pierini, MD   6 each at 07/01/22 1240   cyclobenzaprine (FLEXERIL) tablet 10 mg  10 mg Oral TID PRN Jadene Pierini, MD   10 mg at 07/01/22 0839   docusate sodium (COLACE) capsule 100 mg  100 mg Oral BID Jadene Pierini, MD   100 mg at 07/01/22 0839   donepezil (ARICEPT) tablet 10 mg  10 mg Oral Daily Jadene Pierini, MD   10 mg at 07/01/22 0839   fluvoxaMINE (LUVOX) tablet 200 mg  200 mg Oral QHS Jadene Pierini, MD   200 mg at 06/30/22 2003   heparin injection 5,000 Units  5,000 Units Subcutaneous Q8H Jadene Pierini, MD   5,000 Units at 07/01/22 0841   HYDROmorphone (DILAUDID) injection 1 mg  1 mg Intravenous Q3H PRN Jadene Pierini, MD   1 mg at 07/01/22 1438   lamoTRIgine (LAMICTAL) tablet 200 mg  200 mg Oral q AM Jadene Pierini, MD   200 mg at 07/01/22 5427   levothyroxine (SYNTHROID) tablet 88 mcg  88 mcg Oral QAC breakfast Jadene Pierini, MD   88 mcg at 07/01/22 0623   loperamide (IMODIUM) capsule 4 mg  4 mg Oral q AM Jadene Pierini, MD   4 mg at 06/29/22 0848   losartan (COZAAR) tablet 100 mg  100 mg Oral QHS Jadene Pierini, MD   100 mg at 06/30/22 2233   menthol-cetylpyridinium (CEPACOL) lozenge 3 mg  1 lozenge Oral PRN Jadene Pierini, MD       Or   phenol (CHLORASEPTIC) mouth spray 1 spray  1 spray Mouth/Throat PRN Jadene Pierini, MD       metFORMIN (GLUCOPHAGE) tablet 1,000 mg  1,000 mg Oral BID WC Jadene Pierini, MD   1,000 mg at 07/01/22 7628   multivitamin with minerals tablet 1 tablet  1 tablet Oral Daily Jadene Pierini, MD   1 tablet at 06/30/22 0832   ondansetron (ZOFRAN) tablet 4 mg  4 mg Oral Q6H PRN Jadene Pierini, MD       Or   ondansetron (ZOFRAN) injection 4 mg  4 mg Intravenous Q6H PRN Jadene Pierini, MD   4 mg at 07/01/22 3151   oxyCODONE (Oxy  IR/ROXICODONE) immediate release tablet 10 mg  10 mg Oral Q4H PRN Jadene Pierini, MD   10 mg at 07/01/22 7616   oxyCODONE (Oxy IR/ROXICODONE) immediate release tablet 15 mg  15 mg Oral Q4H PRN Jadene Pierini, MD   15 mg at 07/01/22 1241   pantoprazole (PROTONIX) EC tablet 80 mg  80 mg Oral QHS Jadene Pierini, MD   80 mg at 06/30/22 2002   polyethylene glycol (MIRALAX / GLYCOLAX) packet 17 g  17 g Oral Daily PRN Jadene Pierini, MD  17 g at 07/01/22 1439   QUEtiapine (SEROQUEL) tablet 600 mg  600 mg Oral QHS Judith Part, MD   600 mg at 06/30/22 2234   rosuvastatin (CRESTOR) tablet 20 mg  20 mg Oral QHS Judith Part, MD   20 mg at 06/30/22 2234   Semaglutide (1 MG/DOSE) SOPN 2 mg  2 mg Subcutaneous Q Mon Ostergard, Joyice Faster, MD       traZODone (DESYREL) tablet 100 mg  100 mg Oral QHS Judith Part, MD   100 mg at 06/30/22 2233     Discharge Medications: Please see discharge summary for a list of discharge medications.  Relevant Imaging Results:  Relevant Lab Results:   Additional Information SSN: [id number].  Pt is vaccinated for covid with boosters.  Joanne Chars, LCSW

## 2022-07-01 NOTE — Progress Notes (Signed)
Neurosurgery Service Progress Note  Subjective: No acute events overnight, did well off PCA, again feels better than she did yesterday, +flatus no BM  Objective: Vitals:   06/30/22 1845 06/30/22 1953 06/30/22 2237 07/01/22 0717  BP: (!) 154/84 (!) 146/79 (!) 157/87 (!) 167/86  Pulse: 80 81  80  Resp:  18  17  Temp: 98 F (36.7 C) 98 F (36.7 C)  98.6 F (37 C)  TempSrc: Oral Oral  Oral  SpO2: 93% 96%  96%  Weight:      Height:        Physical Exam: Strength 5/5 x4 and SILTx4   Assessment & Plan: 66 y.o. woman s/p fracture through prior fusion mass s/p PSIF.  -activity as tolerated -calcitonin -PT/OT rec home PTOT -SCDs/TEDs/SQH -discharge home today vs tomorrow  Jadene Pierini  07/01/22 9:50 AM

## 2022-07-02 LAB — GLUCOSE, CAPILLARY: Glucose-Capillary: 118 mg/dL — ABNORMAL HIGH (ref 70–99)

## 2022-07-02 MED ORDER — INSULIN ASPART 100 UNIT/ML IJ SOLN: 0.0000 [IU] | Freq: Three times a day (TID) | INTRAMUSCULAR | Status: DC

## 2022-07-02 NOTE — Progress Notes (Signed)
Physical Therapy Treatment Patient Details Name: [name] MRN: [id number] DOB: 09/23/1955 Today's Date: 07/02/2022   History of Present Illness 66 y.o. woman that presents with 2 weeks of severe low back pain after a fall. MRI shows  post-op changes with large fusion mass, fracture through all three columns of L3; s/p OR 12/9 for fracture stabilization    PT Comments    Patient progressing slowly towards PT goals. Session focused on sitting and standing as well as pre gait. Pt requires Mod-max A for bed mobility with step by step cues for sequencing. Tolerated standing with assist of 2 and taking a few steps along side the bed. Pt continues to be particular, anxious about movement and easily distracted needing repetition of cues for technique and to stay on task. Better able to sit EOB today with min guard assist for longer periods today. Continues to be appropriate for SNF. Will progress OOB next session as tolerated. Will follow.    Recommendations for follow up therapy are one component of a multi-disciplinary discharge planning process, led by the attending physician.  Recommendations may be updated based on patient status, additional functional criteria and insurance authorization.  Follow Up Recommendations  Skilled nursing-short term rehab (<3 hours/day) Can patient physically be transported by private vehicle: No   Assistance Recommended at Discharge Frequent or constant Supervision/Assistance  Patient can return home with the following A lot of help with bathing/dressing/bathroom;Assistance with cooking/housework;Assist for transportation;Two people to help with walking and/or transfers;Direct supervision/assist for medications management;Help with stairs or ramp for entrance   Equipment Recommendations  Rolling walker (2 wheels);BSC/3in1    Recommendations for Other Services       Precautions / Restrictions Precautions Precautions: Back Precaution Booklet Issued:  No Precaution Comments: "no Brace Needed" in order set for 12/10 Restrictions Weight Bearing Restrictions: No     Mobility  Bed Mobility Overal bed mobility: Needs Assistance Bed Mobility: Rolling, Sidelying to Sit, Sit to Sidelying Rolling: Mod assist Sidelying to sit: Max assist, HOB elevated   Sit to supine: Mod assist, HOB elevated   General bed mobility comments: Step by step cues to roll towards right, reach for rail, bring LEs off bed and elevate trunk. Assist to bring LEs into bed to return to supine. Pushing posteriorly once upright with BUEs behind her.    Transfers Overall transfer level: Needs assistance Equipment used: 2 person hand held assist Transfers: Sit to/from Stand Sit to Stand: Min assist, +2 physical assistance, From elevated surface, Mod assist           General transfer comment: Assist of 2 to power to standing from EOB x2 with use of momentum and cues for upright. Limited standing tolerance.    Ambulation/Gait Ambulation/Gait assistance: Mod assist, +2 physical assistance Gait Distance (Feet): 3 Feet (+ 2') Assistive device: 2 person hand held assist Gait Pattern/deviations: Trunk flexed Gait velocity: decreased     General Gait Details: Able to take a few steps along side bed with assist for weight shifting and balance. 1 seated rest break. Anxious   Stairs             Wheelchair Mobility    Modified Rankin (Stroke Patients Only)       Balance Overall balance assessment: Needs assistance Sitting-balance support: Feet supported, Bilateral upper extremity supported Sitting balance-Leahy Scale: Fair Sitting balance - Comments: Initially pt leaning posteriorly needing Max A for balance progressing to min guard with Ues on therapist's legs working on anterior weight  shifts and upright posture. "Someone hold onto me" Gets easily anxious resulting in leaning posteriorly and falling backwards. Able to sit for ~10 mins. Postural control:  Posterior lean Standing balance support: During functional activity, Bilateral upper extremity supported Standing balance-Leahy Scale: Poor Standing balance comment: Requires assist of 2 for standing balance, limited standing tolerance.                            Cognition Arousal/Alertness: Awake/alert Behavior During Therapy: WFL for tasks assessed/performed Overall Cognitive Status: Difficult to assess                                 General Comments: Impaired memory, does not recall getting OOB to [facility] with therapy.  Repeats self and needs repetition to follow simple 1 step commands as pt highly anxious and hyperfocused on pain.        Exercises      General Comments General comments (skin integrity, edema, etc.): particular, easily anxious, slow to mobilize.      Pertinent Vitals/Pain Pain Assessment Pain Assessment: Faces Faces Pain Scale: Hurts little more Pain Location: back Pain Descriptors / Indicators: Grimacing, Sore, Operative site guarding, Discomfort Pain Intervention(s): Monitored during session, Premedicated before session, Repositioned, Limited activity within patient's tolerance    Home Living                          Prior Function            PT Goals (current goals can now be found in the care plan section) Progress towards PT goals: Progressing toward goals    Frequency    Min 3X/week      PT Plan Current plan remains appropriate    Co-evaluation              AM-PAC PT "6 Clicks" Mobility   Outcome Measure  Help needed turning from your back to your side while in a flat bed without using bedrails?: A Lot Help needed moving from lying on your back to sitting on the side of a flat bed without using bedrails?: A Lot Help needed moving to and from a bed to a chair (including a wheelchair)?: Total Help needed standing up from a chair using your arms (e.g., wheelchair or bedside chair)?: Total Help  needed to walk in hospital room?: Total Help needed climbing 3-5 steps with a railing? : Total 6 Click Score: 8    End of Session   Activity Tolerance: Patient tolerated treatment well Patient left: in bed;with call bell/phone within reach;with bed alarm set Nurse Communication: Mobility status PT Visit Diagnosis: Unsteadiness on feet (R26.81);Muscle weakness (generalized) (M62.81);Pain Pain - part of body:  (back)     Time: 3875-6433 PT Time Calculation (min) (ACUTE ONLY): 32 min  Charges:  $Therapeutic Activity: 23-37 mins                     Vale Haven, PT, DPT Acute Rehabilitation Services Secure chat preferred Office [phone]      Blake Divine A Kailah Pennel 07/02/2022, 3:26 PM

## 2022-07-02 NOTE — Progress Notes (Signed)
Pt had not voided since her last I&O cath yesterday evening. Pt able to feel that her bladder is full, bladder scan showing >999. I&O'd patient with 1650cc output of amber colored urine. Dr. Marcy Siren office called and message left for his medical assistant.

## 2022-07-02 NOTE — Progress Notes (Signed)
Neurosurgery Service Progress Note  Subjective: No acute events overnight, again good slow improvements  Objective: Vitals:   06/30/22 2237 07/01/22 0717 07/01/22 1210 07/01/22 2144  BP: (!) 157/87 (!) 167/86 (!) 154/83 (!) 144/78  Pulse:  80 78 79  Resp:  17 17 18   Temp:  98.6 F (37 C) 97.9 F (36.6 C) 98.7 F (37.1 C)  TempSrc:  Oral Oral   SpO2:  96% 97% 98%  Weight:      Height:        Physical Exam: Strength 5/5 x4 and SILTx4, incision c/d/i  Assessment & Plan: 66 y.o. woman s/p fracture through prior fusion mass s/p PSIF.  -activity as tolerated -calcitonin -SCDs/TEDs/SQH -discharge SNF pending, FL2 signed  79  07/02/22 8:04 AM

## 2022-07-02 NOTE — TOC Progression Note (Signed)
Transition of Care [facility]) - Progression Note    Patient Details  Name: SAMIYA MERVIN MRN: [id number] Date of Birth: 21-Nov-1955  [facility]) CM/SW Contact  Delilah Shan, LCSWA Phone Number: 07/02/2022, 4:44 PM  Clinical Narrative:     CSW spoke with patient regarding SNF bed offers. Patient request for CSW to fax her out in [place] area. CSW faxed out patients initial referral near the [place] area. CSW informed patient when SNF bed offers are in CSW will provide medicare compare SNF ratings list with accepted SNF bed offers. Patient thanked CSW. CSW following to start insurance authorization close to patient being medically ready for dc.CSW will continue to follow and assist with patients dc planning needs.  Expected Discharge Plan: Skilled Nursing Facility Barriers to Discharge: Continued Medical Work up, SNF Pending bed offer  Expected Discharge Plan and Services Expected Discharge Plan: Skilled Nursing Facility In-house Referral: Clinical Social Work   Post Acute Care Choice: Skilled Nursing Facility Living arrangements for the past 2 months: Single Family Home                                       Social Determinants of Health (SDOH) Interventions    Readmission Risk Interventions     No data to display

## 2022-07-03 LAB — GLUCOSE, CAPILLARY
Glucose-Capillary: 120 mg/dL — ABNORMAL HIGH (ref 70–99)
Glucose-Capillary: 108 mg/dL — ABNORMAL HIGH (ref 70–99)
Glucose-Capillary: 177 mg/dL — ABNORMAL HIGH (ref 70–99)
Glucose-Capillary: 145 mg/dL — ABNORMAL HIGH (ref 70–99)

## 2022-07-03 MED ORDER — NAPROXEN 250 MG PO TABS
375.0000 mg | ORAL_TABLET | Freq: Two times a day (BID) | ORAL | Status: DC
  Administered 2022-07-03 – 2022-07-08 (×10): 375 mg via ORAL
  Filled 2022-07-03 (×10): qty 2

## 2022-07-03 NOTE — TOC Progression Note (Addendum)
Transition of Care [facility]) - Progression Note    Patient Details  Name: [name] MRN: [id number] Date of Birth: 07-04-1956  Transition of Care [facility]) CM/SW Contact  Jimmy Picket, [place] Phone Number: 07/03/2022, 3:57 PM  Clinical Narrative:     CSW gave pt her 4 bed offers. Pt states she will review with her spouse.  4:15- LCSW called into pts room to answer questions. Pt states that she is unhappy that I gave her all 1 star facilities. CSW explained that those are the facilities that said yes to her. Pt stated that this is unacceptable and states, she will reach out to her family friend at the hospital to find her better facilities.   Expected Discharge Plan: Skilled Nursing Facility Barriers to Discharge: Continued Medical Work up, SNF Pending bed offer  Expected Discharge Plan and Services Expected Discharge Plan: Skilled Nursing Facility In-house Referral: Clinical Social Work   Post Acute Care Choice: Skilled Nursing Facility Living arrangements for the past 2 months: Single Family Home                                       Social Determinants of Health (SDOH) Interventions    Readmission Risk Interventions     No data to display         Jimmy Picket, LCSW Clinical Social Worker

## 2022-07-03 NOTE — Progress Notes (Signed)
Patient ID: [name], female   DOB: [birth year], 66 y.o.   MRN: [id number] Vital signs are stable Patient complains of right elbow pain and difficulty moving right upper extremity On examination she is tender to palpation over the region of the right elbow There is minimal swelling in this area Any movement seems to aggravate her pain substantially This feels very consistent with a gout-like syndrome I have advised that we should start a nonsteroidal anti-inflammatory Naprosyn 375 mg twice daily has been written  As regards her lumbar spine it seems that she is mobilizing minimally She will likely require some physical therapy and inpatient rehabilitation Continue supportive care for now

## 2022-07-04 LAB — GLUCOSE, CAPILLARY
Glucose-Capillary: 133 mg/dL — ABNORMAL HIGH (ref 70–99)
Glucose-Capillary: 122 mg/dL — ABNORMAL HIGH (ref 70–99)
Glucose-Capillary: 104 mg/dL — ABNORMAL HIGH (ref 70–99)

## 2022-07-04 MED ORDER — CHLORHEXIDINE GLUCONATE CLOTH 2 % EX PADS
6.0000 | MEDICATED_PAD | Freq: Every day | CUTANEOUS | Status: DC
  Administered 2022-07-04 – 2022-07-08 (×4): 6 via TOPICAL

## 2022-07-04 NOTE — Progress Notes (Signed)
Mobility Specialist Progress Note   07/04/22 1200  Mobility  Activity Moved into chair position in bed; Turned to right side;Turned to left side  Level of Assistance Moderate assist, patient does 50-74% (+2)  Assistive Device None  Range of Motion/Exercises Active;All extremities  Activity Response Tolerated well   Patient received on bed pan requesting assistance. Required mod A +2 to turn patient and totalA for pericare. Deferred any OOB activity or dangling EOB second to increased pain and sensitivity.  Returned to supine without incident. Was left with HOB >35 degrees for lunch , with all needs met and call bell in reach.   [name], BS EXP Mobility Specialist Please contact via SecureChat or Rehab office at [phone]

## 2022-07-04 NOTE — Progress Notes (Signed)
  NEUROSURGERY PROGRESS NOTE   No issues overnight. Much improved right elbow pain today. Cont to c/o numbness in feet.  EXAM:  BP 135/73 (BP Location: Left Arm)   Pulse 81   Temp 98 F (36.7 C)   Resp 18   Ht 5' 0.98" (1.549 m)   Wt 72.6 kg   SpO2 95%   BMI 30.25 kg/m   Awake, alert, oriented  Speech fluent, appropriate  CN grossly intact  5/5 BUE/BLE   IMPRESSION:  66 y.o. female POD# 8 s/p L1-L5 stabilization of L3 fracture, recovering slowly Right elbow arthritis ?gouty flareup is improved  PLAN: - Cont PT/OT, will likely need SNF placement - Cont Naprosyn   Lisbeth Renshaw, MD [place] [facility]

## 2022-07-05 LAB — GLUCOSE, CAPILLARY
Glucose-Capillary: 102 mg/dL — ABNORMAL HIGH (ref 70–99)
Glucose-Capillary: 122 mg/dL — ABNORMAL HIGH (ref 70–99)
Glucose-Capillary: 110 mg/dL — ABNORMAL HIGH (ref 70–99)
Glucose-Capillary: 109 mg/dL — ABNORMAL HIGH (ref 70–99)

## 2022-07-05 MED ORDER — METHOCARBAMOL 750 MG PO TABS
750.0000 mg | ORAL_TABLET | Freq: Three times a day (TID) | ORAL | Status: DC | PRN
  Administered 2022-07-05 – 2022-07-08 (×5): 750 mg via ORAL
  Filled 2022-07-05 (×5): qty 1

## 2022-07-05 NOTE — Plan of Care (Signed)

## 2022-07-05 NOTE — TOC Progression Note (Signed)
Transition of Care [facility]) - Progression Note    Patient Details  Name: [name] MRN: [id number] Date of Birth: 1955-09-29  Transition of Care [facility]) CM/SW [place], [place] Phone Number: 07/05/2022, 4:24 PM  Clinical Narrative:     CSW met with pt and provided additional bed offers. She wants to talk with family before making choice.   1500: pt's daughter Richarda Osmond contacted CSW and notified they would like pt to go to [facility]. CSW initiated SNF auth request in navi portal. Auth status: pending   Expected Discharge Plan: [place] Barriers to Discharge: Continued Medical Work up, SNF Pending bed offer  Expected Discharge Plan and Services Expected Discharge Plan: [place] In-house Referral: Clinical Social Work   Post Acute Care Choice: [place] Living arrangements for the past 2 months: Single Family Home                                       Social Determinants of Health (SDOH) Interventions    Readmission Risk Interventions     No data to display

## 2022-07-05 NOTE — Progress Notes (Signed)
   Providing Compassionate, Quality Care - Together  NEUROSURGERY PROGRESS NOTE   S: No issues overnight.  Still having significant low back spasms  O: EXAM:  BP (!) 140/66 (BP Location: Left Arm)   Pulse 70   Temp 97.9 F (36.6 C) (Oral)   Resp 16   Ht 5' 0.98" (1.549 m)   Wt 72.6 kg   SpO2 100%   BMI 30.25 kg/m   Awake, alert, oriented  PERRL Speech fluent, appropriate  CNs grossly intact  5/5 BUE/BLE  Wound clean dry and intact  ASSESSMENT:  67 y.o. female with   Status post lumbar revision instrumentation and fusion for fracture  PLAN: -SNF pending -Will change Flexeril to methocarbamol    Thank you for allowing me to participate in this patient's care.  Please do not hesitate to call with questions or concerns.   Monia Pouch, DO Neurosurgeon [facility] Cell: [phone]

## 2022-07-05 NOTE — Progress Notes (Signed)
Physical Therapy Treatment Patient Details Name: [name] MRN: [id number] DOB: 06-28-1956 Today's Date: 07/05/2022   History of Present Illness 66 y.o. woman that presents with 2 weeks of severe low back pain after a fall. MRI shows  post-op changes with large fusion mass, fracture through all three columns of L3; s/p OR 12/9 for fracture stabilization    PT Comments    Pt received in bed, seen by PT/OT to maximize skilled assist and problem solve mobility with increased pain tolerance. Pt needed max A to come to EOB and initially maintained strong posterior lean propping sef on UE's. Worked on increasing hip flexion until she was eventually at 90 deg and able to grab bar of stedy. Sit>stand  with max A+2 from elevated surface working on coming to full upright. Pt unable to step feet once standing and could maintain <1 min before needing to sit again. Continues to make slow gains, limited by anxiety and pain. PT will continue to follow.    Recommendations for follow up therapy are one component of a multi-disciplinary discharge planning process, led by the attending physician.  Recommendations may be updated based on patient status, additional functional criteria and insurance authorization.  Follow Up Recommendations  Skilled nursing-short term rehab (<3 hours/day) Can patient physically be transported by private vehicle: No   Assistance Recommended at Discharge Frequent or constant Supervision/Assistance  Patient can return home with the following A lot of help with bathing/dressing/bathroom;Assistance with cooking/housework;Assist for transportation;Two people to help with walking and/or transfers;Direct supervision/assist for medications management;Help with stairs or ramp for entrance   Equipment Recommendations  Rolling walker (2 wheels);BSC/3in1    Recommendations for Other Services       Precautions / Restrictions Precautions Precautions: Back Precaution Booklet  Issued: No Precaution Comments: "no Brace Needed" in order set for 12/10 Restrictions Weight Bearing Restrictions: No     Mobility  Bed Mobility Overal bed mobility: Needs Assistance Bed Mobility: Rolling, Sidelying to Sit, Sit to Sidelying Rolling: Mod assist Sidelying to sit: Max assist, HOB elevated, +2 for physical assistance   Sit to supine: HOB elevated, Max assist, +2 for physical assistance   General bed mobility comments: Step by step cues to roll towards right, reach for rail, bring LEs off bed and elevate trunk. Assist to bring LEs into bed to return to supine. Pushing posteriorly once upright with BUEs behind her.    Transfers Overall transfer level: Needs assistance Equipment used: Ambulation equipment used Transfers: Sit to/from Stand Sit to Stand: +2 physical assistance, From elevated surface, Mod assist           General transfer comment: Assist of 2 to power to standing from EOB x2 with use of momentum and cues for upright. Limited standing tolerance.    Ambulation/Gait               General Gait Details: unable to step feet in standing, keeps trunk flexed   Stairs             Wheelchair Mobility    Modified Rankin (Stroke Patients Only)       Balance Overall balance assessment: Needs assistance Sitting-balance support: Feet supported, Bilateral upper extremity supported Sitting balance-Leahy Scale: Poor Sitting balance - Comments: Initially pt leaning posteriorly needing Max A for balance progressing to min guard with UEs on therapist's legs working on anterior weight shifts and upright posture. "Someone hold onto me" Gets easily anxious resulting in leaning posteriorly and falling backwards. Able to sit  for ~10 mins. Postural control: Posterior lean Standing balance support: During functional activity, Bilateral upper extremity supported Standing balance-Leahy Scale: Poor Standing balance comment: Requires assist of 2 for standing  balance, limited standing tolerance.                            Cognition Arousal/Alertness: Awake/alert Behavior During Therapy: WFL for tasks assessed/performed, Anxious Overall Cognitive Status: Difficult to assess                                 General Comments: Impaired memory, does not recall getting OOB to [facility] with therapy.  Repeats self and needs repetition to follow simple 1 step commands as pt highly anxious and hyperfocused on pain.        Exercises      General Comments        Pertinent Vitals/Pain Pain Assessment Pain Assessment: Faces Faces Pain Scale: Hurts even more Pain Location: back Pain Descriptors / Indicators: Grimacing, Sore, Operative site guarding, Discomfort Pain Intervention(s): Limited activity within patient's tolerance, Monitored during session    Home Living Family/patient expects to be discharged to:: Private residence Living Arrangements: Spouse/significant other Available Help at Discharge: Family;Available PRN/intermittently Type of Home: House Home Access: Ramped entrance       Home Layout: One level Home Equipment: Shower seat - built in;Hand held Financial trader (4 wheels);Rolling Walker (2 wheels);Wheelchair - manual      Prior Function            PT Goals (current goals can now be found in the care plan section) Acute Rehab PT Goals Patient Stated Goal: Get pain under control PT Goal Formulation: With patient/family Time For Goal Achievement: 07/11/22 Potential to Achieve Goals: Fair Progress towards PT goals: Progressing toward goals (slowly)    Frequency    Min 3X/week      PT Plan Current plan remains appropriate    Co-evaluation PT/OT/SLP Co-Evaluation/Treatment: Yes Reason for Co-Treatment: Complexity of the patient's impairments (multi-system involvement);Necessary to address cognition/behavior during functional activity;For patient/therapist safety PT goals  addressed during session: Mobility/safety with mobility;Balance;Proper use of DME        AM-PAC PT "6 Clicks" Mobility   Outcome Measure  Help needed turning from your back to your side while in a flat bed without using bedrails?: A Lot Help needed moving from lying on your back to sitting on the side of a flat bed without using bedrails?: A Lot Help needed moving to and from a bed to a chair (including a wheelchair)?: Total Help needed standing up from a chair using your arms (e.g., wheelchair or bedside chair)?: Total Help needed to walk in hospital room?: Total Help needed climbing 3-5 steps with a railing? : Total 6 Click Score: 8    End of Session Equipment Utilized During Treatment: Other (comment) (no gait belt due to position of pt incision. Stayed at EOB 2 person assist) Activity Tolerance: Patient tolerated treatment well Patient left: in bed;with call bell/phone within reach;with bed alarm set Nurse Communication: Mobility status PT Visit Diagnosis: Unsteadiness on feet (R26.81);Muscle weakness (generalized) (M62.81);Pain Pain - Right/Left:  (back) Pain - part of body:  (back)     Time: 9563-8756 PT Time Calculation (min) (ACUTE ONLY): 25 min  Charges:  $Therapeutic Activity: 8-22 mins  Lyanne Co, PT  Acute Rehab Services Secure chat preferred Office [phone]    Elyse Hsu 07/05/2022, 12:45 PM

## 2022-07-05 NOTE — Evaluation (Signed)
Occupational Therapy Evaluation Patient Details Name: [name] MRN: [id number] DOB: 02-May-1956 Today's Date: 07/05/2022   History of Present Illness 66 y.o. woman that presents with 2 weeks of severe low back pain after a fall. MRI shows  post-op changes with large fusion mass, fracture through all three columns of L3; s/p OR 12/9 for fracture stabilization   Clinical Impression   Pt presents w/ deficits in strength, sitting/standing balance, and overall endurance. Pt also remains limited by post op pain and anxiety related to pain anticipation with movement. Overall, pt requires Mod A x 2 for brief standing attempts in [place] while assisted with peri care. Pt requires Min A for UB ADL and up to Total A (+2 if in standing) for LB ADLs. As pt below functional baseline and family unable to provide the physical assist pt currently requires, rec SNF rehab at [place]. Will continue to follow acutely.      Recommendations for follow up therapy are one component of a multi-disciplinary discharge planning process, led by the attending physician.  Recommendations may be updated based on patient status, additional functional criteria and insurance authorization.   Follow Up Recommendations  Skilled nursing-short term rehab (<3 hours/day)     Assistance Recommended at Discharge Frequent or constant Supervision/Assistance  Patient can return home with the following Two people to help with walking and/or transfers;A lot of help with bathing/dressing/bathroom    Functional Status Assessment  Patient has had a recent decline in their functional status and demonstrates the ability to make significant improvements in function in a reasonable and predictable amount of time.  Equipment Recommendations  Other (comment) (TBD pending progress)    Recommendations for Other Services       Precautions / Restrictions Precautions Precautions: Back Precaution Booklet Issued: No Precaution Comments: "no  Brace Needed" in order set for 12/10 Restrictions Weight Bearing Restrictions: No      Mobility Bed Mobility Overal bed mobility: Needs Assistance Bed Mobility: Rolling, Sidelying to Sit, Sit to Sidelying Rolling: Mod assist Sidelying to sit: Max assist, HOB elevated, +2 for physical assistance   Sit to supine: HOB elevated, Max assist, +2 for physical assistance Sit to sidelying: Min guard General bed mobility comments: Step by step cues to roll towards right, reach for rail, bring LEs off bed and elevate trunk. Assist to bring LEs into bed to return to supine. Pushing posteriorly once upright with BUEs behind her. poor follow through of cues to return to supine by first laying on side    Transfers Overall transfer level: Needs assistance Equipment used: Ambulation equipment used Transfers: Sit to/from Stand Sit to Stand: +2 physical assistance, From elevated surface, Mod assist           General transfer comment: Assist of 2 to power to standing from EOB x2 with use of momentum and cues for upright. Limited standing tolerance.      Balance Overall balance assessment: Needs assistance Sitting-balance support: Feet supported, Bilateral upper extremity supported Sitting balance-Leahy Scale: Poor Sitting balance - Comments: Initially pt leaning posteriorly needing Max A for balance progressing to min guard with UEs on therapist's legs working on anterior weight shifts and upright posture. "Someone hold onto me" Gets easily anxious resulting in leaning posteriorly and falling backwards. Able to sit for ~10 mins. Postural control: Posterior lean Standing balance support: During functional activity, Bilateral upper extremity supported Standing balance-Leahy Scale: Poor Standing balance comment: Requires assist of 2 for standing balance, limited standing tolerance.  ADL either performed or assessed with clinical judgement   ADL Overall ADL's :  Needs assistance/impaired Eating/Feeding: Independent;Bed level   Grooming: Set up;Sitting;Bed level   Upper Body Bathing: Sitting;Minimal assistance   Lower Body Bathing: Maximal assistance;Sitting/lateral leans;Bed level   Upper Body Dressing : Minimal assistance;Sitting   Lower Body Dressing: Total assistance;Sitting/lateral leans;Bed level       Toileting- Clothing Manipulation and Hygiene: Total assistance;Bed level Toileting - Clothing Manipulation Details (indicate cue type and reason): clean up standing in [place] briefly after noted bed pad soiled       General ADL Comments: Limited by pain and perseveration on pain     Vision Baseline Vision/History: 1 Wears glasses Ability to See in Adequate Light: 0 Adequate Patient Visual Report: No change from baseline Vision Assessment?: No apparent visual deficits     Perception     Praxis      Pertinent Vitals/Pain Pain Assessment Pain Assessment: Faces Faces Pain Scale: Hurts even more Pain Location: back Pain Descriptors / Indicators: Grimacing, Sore, Operative site guarding, Discomfort Pain Intervention(s): Monitored during session, Premedicated before session, Limited activity within patient's tolerance     Hand Dominance Right   Extremity/Trunk Assessment Upper Extremity Assessment Upper Extremity Assessment: Generalized weakness   Lower Extremity Assessment Lower Extremity Assessment: Defer to PT evaluation   Cervical / Trunk Assessment Cervical / Trunk Assessment: Back Surgery   Communication Communication Communication: No difficulties;Other (comment) (occasionally slurred speech that pt attributes to pain meds)   Cognition Arousal/Alertness: Awake/alert Behavior During Therapy: WFL for tasks assessed/performed, Anxious Overall Cognitive Status: Impaired/Different from baseline Area of Impairment: Memory, Awareness, Safety/judgement, Problem solving                     Memory: Decreased  short-term memory   Safety/Judgement: Decreased awareness of deficits, Decreased awareness of safety Awareness: Intellectual Problem Solving: Slow processing, Decreased initiation, Difficulty sequencing, Requires verbal cues, Requires tactile cues General Comments: hyperfocused on pain, poor attention, impaired memory and recall of precautions, prior therapy attempts, etc     General Comments       Exercises     Shoulder Instructions      Home Living Family/patient expects to be discharged to:: Private residence Living Arrangements: Spouse/significant other Available Help at Discharge: Family;Available PRN/intermittently Type of Home: House Home Access: Ramped entrance     Home Layout: One level     Bathroom Shower/Tub: Producer, television/film/video: Handicapped height     Home Equipment: Shower seat - built in;Hand held Financial trader (4 wheels);Rolling Walker (2 wheels);Wheelchair - manual          Prior Functioning/Environment Prior Level of Function : Needs assist             Mobility Comments: per chart and PT eval, pt essentially bedbound for past 2 weeks leading up to admission          OT Problem List: Decreased activity tolerance;Decreased strength;Impaired balance (sitting and/or standing);Decreased cognition;Decreased knowledge of use of DME or AE;Decreased safety awareness;Decreased knowledge of precautions;Pain      OT Treatment/Interventions: Self-care/ADL training;Energy conservation;Therapeutic exercise;DME and/or AE instruction;Therapeutic activities;Patient/family education    OT Goals(Current goals can be found in the care plan section) Acute Rehab OT Goals Patient Stated Goal: pain control, be able to take steps OT Goal Formulation: With patient Time For Goal Achievement: 07/19/22 Potential to Achieve Goals: Good ADL Goals Pt Will Perform Lower Body Bathing: with mod assist;sit to/from stand;sitting/lateral leans;with  adaptive equipment Pt Will Perform Lower Body Dressing: with mod assist;sitting/lateral leans;sit to/from stand;with adaptive equipment Pt Will Transfer to Toilet: with mod assist;stand pivot transfer;bedside commode Additional ADL Goal #1: Pt to complete bed mobility with Min A in prep for ADL transfers  OT Frequency: Min 2X/week    Co-evaluation PT/OT/SLP Co-Evaluation/Treatment: Yes Reason for Co-Treatment: For patient/therapist safety;To address functional/ADL transfers PT goals addressed during session: Mobility/safety with mobility;Balance;Proper use of DME OT goals addressed during session: ADL's and self-care;Strengthening/ROM      AM-PAC OT "6 Clicks" Daily Activity     Outcome Measure Help from another person eating meals?: None Help from another person taking care of personal grooming?: A Little Help from another person toileting, which includes using toliet, bedpan, or urinal?: Total Help from another person bathing (including washing, rinsing, drying)?: A Lot Help from another person to put on and taking off regular upper body clothing?: A Little Help from another person to put on and taking off regular lower body clothing?: Total 6 Click Score: 14   End of Session    Activity Tolerance: Patient limited by pain Patient left: in bed;with call bell/phone within reach  OT Visit Diagnosis: Unsteadiness on feet (R26.81);Other abnormalities of gait and mobility (R26.89)                Time: 3762-8315 OT Time Calculation (min): 28 min Charges:  OT General Charges $OT Visit: 1 Visit OT Evaluation $OT Eval Moderate Complexity: 1 Mod  Bradd Canary, OTR/L Acute Rehab Services Office: [phone]   Lorre Munroe 07/05/2022, 1:21 PM

## 2022-07-06 LAB — GLUCOSE, CAPILLARY
Glucose-Capillary: 116 mg/dL — ABNORMAL HIGH (ref 70–99)
Glucose-Capillary: 119 mg/dL — ABNORMAL HIGH (ref 70–99)
Glucose-Capillary: 146 mg/dL — ABNORMAL HIGH (ref 70–99)

## 2022-07-06 MED ORDER — NAPROXEN 375 MG PO TABS: 375.0000 mg | ORAL_TABLET | Freq: Two times a day (BID) | ORAL | 3 refills | Status: DC

## 2022-07-06 MED ORDER — OXYCODONE HCL 15 MG PO TABS: 15.0000 mg | ORAL_TABLET | ORAL | 0 refills | Status: DC | PRN

## 2022-07-06 MED ORDER — ONDANSETRON HCL 4 MG PO TABS: 4.0000 mg | ORAL_TABLET | Freq: Four times a day (QID) | ORAL | 0 refills | Status: DC | PRN

## 2022-07-06 NOTE — Care Management Important Message (Signed)
Important Message  Patient Details  Name: [name] MRN: [id number] Date of Birth: 04-30-1956   Medicare Important Message Given:  Yes  Called the patient who as that I send her a copy of the IM to her home address she is discharging today.   Bashir Marchetti 07/06/2022, 4:15 PM

## 2022-07-06 NOTE — Discharge Summary (Signed)
Physician Discharge Summary  Patient ID: [name] MRN: [id number] DOB/AGE: 1955/09/03 66 y.o.  Admit date: 06/25/2022 Discharge date: 07/08/2022  Admission Diagnoses: L3 burst fracture with instability  Discharge Diagnoses: L3 burst fracture with instability Principal Problem:   Lumbar vertebral fracture (HCC) Active Problems:   Fracture of lumbar spine without cord injury [facility])   Discharged Condition: fair  Hospital Course: Patient was admitted to undergo surgical decompression and stabilization of an L3 burst fracture.  She underwent fusion from L1-L5.  She tolerated surgery well.  She is now functioning independently and still has significant pain in other extremities.  She is discharged to a skilled nursing facility.  Wound some slight superficial superior and inferior dehiscence, this did not connect to deeper layers.Wound care evaluated the patient, recommended twice daily wet-to-dry dressing changes while at SNF.  Consults: None  Significant Diagnostic Studies: None  Treatments: surgery: See op note  Discharge Exam: Blood pressure (!) 147/77, pulse 76, temperature 98 F (36.7 C), temperature source Oral, resp. rate 18, height 5' 0.98" (1.549 m), weight 72.6 kg, SpO2 97 %. Motor function is intact.  Patient's mobility is limited secondary to pain.  She has been working with physical therapy but still require further convalescence.  Disposition:    Discharge Instructions     Call MD for:  redness, tenderness, or signs of infection (pain, swelling, redness, odor or green/yellow discharge around incision site)   Complete by: As directed    Call MD for:  severe uncontrolled pain   Complete by: As directed    Call MD for:  temperature >100.4   Complete by: As directed    Diet - low sodium heart healthy   Complete by: As directed    Increase activity slowly   Complete by: As directed    No wound care   Complete by: As directed       Allergies as of  07/08/2022   No Known Allergies      Medication List     STOP taking these medications    cyclobenzaprine 5 MG tablet Commonly known as: FLEXERIL       TAKE these medications    amLODipine 5 MG tablet Commonly known as: NORVASC Take 5 mg by mouth in the morning.   buPROPion 300 MG 24 hr tablet Commonly known as: WELLBUTRIN XL Take 300 mg by mouth daily.   carvedilol 6.25 MG tablet Commonly known as: COREG Take 6.25 mg by mouth in the morning and at bedtime.   donepezil 10 MG tablet Commonly known as: ARICEPT Take 10 mg by mouth in the morning.   fluvoxaMINE 100 MG tablet Commonly known as: LUVOX Take 200 mg by mouth at bedtime.   ibuprofen 200 MG tablet Commonly known as: ADVIL Take 400 mg by mouth See admin instructions. Take 400 mg by mouth every four hours, rotating with 1,000 mg Tylenol   lamoTRIgine 100 MG tablet Commonly known as: LAMICTAL Take 200 mg by mouth in the morning.   levothyroxine 88 MCG tablet Commonly known as: SYNTHROID Take 88 mcg by mouth daily before breakfast.   loperamide 2 MG capsule Commonly known as: IMODIUM Take 4 mg by mouth in the morning.   losartan 100 MG tablet Commonly known as: COZAAR Take 100 mg by mouth at bedtime.   meloxicam 15 MG tablet Commonly known as: MOBIC Take 15 mg by mouth daily as needed for pain.   metFORMIN 500 MG tablet Commonly known as: GLUCOPHAGE Take 1,000 mg  by mouth 2 (two) times daily with a meal.   methocarbamol 750 MG tablet Commonly known as: Robaxin-750 Take 1 tablet (750 mg total) by mouth every 8 (eight) hours as needed for muscle spasms.   multivitamin tablet Take 1 tablet by mouth daily.   naproxen 375 MG tablet Commonly known as: NAPROSYN Take 1 tablet (375 mg total) by mouth 2 (two) times daily with a meal.   ondansetron 4 MG tablet Commonly known as: ZOFRAN Take 1 tablet (4 mg total) by mouth every 6 (six) hours as needed for nausea or vomiting.   oxyCODONE 15 MG  immediate release tablet Commonly known as: ROXICODONE Take 1 tablet (15 mg total) by mouth every 4 (four) hours as needed for severe pain.   Ozempic (1 MG/DOSE) 4 MG/3ML Sopn Generic drug: Semaglutide (1 MG/DOSE) Inject 2 mg into the skin every Monday.   pantoprazole 40 MG tablet Commonly known as: PROTONIX Take 80 mg by mouth at bedtime.   QUEtiapine 300 MG tablet Commonly known as: SEROQUEL Take 600 mg by mouth at bedtime.   rosuvastatin 20 MG tablet Commonly known as: CRESTOR Take 20 mg by mouth at bedtime.   traZODone 50 MG tablet Commonly known as: DESYREL Take 100 mg by mouth at bedtime.   TYLENOL 500 MG tablet Generic drug: acetaminophen Take 1,000 mg by mouth See admin instructions. Take 1,000 mg by mouth every four hours, rotating with 400 mg Ibuprofen               Durable Medical Equipment  (From admission, onward)           Start     Ordered   07/01/22 0725  For home use only DME 3 n 1  Once       Comments: Confined to one room   07/01/22 0725   07/01/22 0724  For home use only DME Walker rolling  Once       Question Answer Comment  Walker: With 5 Inch Wheels   Patient needs a walker to treat with the following condition Gait instability      07/01/22 0725            Contact information for after-discharge care     Destination     [facility]erred SNF .   Service: Skilled Nursing Contact information: [address] [place] [address] [phone]                     Signed: Alan Mulder Dawley 07/08/2022, 10:09 AM

## 2022-07-06 NOTE — Progress Notes (Addendum)
Contacted Dr.Elsner due to patient complaint that she has not seen a provider today and that providers had not discussed her discharge plan.  Dr. Danielle Dess stated that Dr. Jake Samples assessed and talked to patient earlier today.  Dr. Danielle Dess also came up immediately to talk to patient and to answer all questions.Verbal order received to discontinue discharge order based on patient request to not discharge today.

## 2022-07-06 NOTE — TOC Progression Note (Addendum)
Transition of Care [facility]) - Progression Note    Patient Details  Name: [name] MRN: [id number] Date of Birth: 01/12/1956  Transition of Care [facility]) CM/SW Contact  Joanne Chars, LCSW Phone Number: 07/06/2022, 8:36 AM  Clinical Narrative:   SNF auth approved in [place]: [id number], 3 days: 12/19-12/21. Attempts to reach Dr Zada Finders.  1130: unable to reach Dr Zada Finders, spoke to office, Dr Kathyrn Sheriff is covering this week, office will ask him to call.   1345: CSW spoke with pt regarding DC today.  Pt states she is not ready to go today and does not agree to this.    1430: CSW met with pt and husband [name], further discussion of DC today.  Pt still stating she does not want to leave today, husband asked CSW to step out so they could talk privately.  1500: CSW returned to room, husband no longer there.  Pt stating she does not agree to DC today.  TOC leadership informed.    Expected Discharge Plan: [place] Barriers to Discharge: Continued Medical Work up, SNF Pending bed offer  Expected Discharge Plan and Services Expected Discharge Plan: [place] In-house Referral: Clinical Social Work   Post Acute Care Choice: [place] Living arrangements for the past 2 months: Single Family Home                                       Social Determinants of Health (SDOH) Interventions    Readmission Risk Interventions     No data to display

## 2022-07-06 NOTE — Progress Notes (Signed)
Telephone order from Dr. Danielle Dess to discontinue foley catheter prior to discharge to rehab.

## 2022-07-07 LAB — GLUCOSE, CAPILLARY
Glucose-Capillary: 139 mg/dL — ABNORMAL HIGH (ref 70–99)
Glucose-Capillary: 130 mg/dL — ABNORMAL HIGH (ref 70–99)
Glucose-Capillary: 132 mg/dL — ABNORMAL HIGH (ref 70–99)
Glucose-Capillary: 152 mg/dL — ABNORMAL HIGH (ref 70–99)

## 2022-07-07 MED ORDER — METHOCARBAMOL 1000 MG/10ML IJ SOLN
1000.0000 mg | Freq: Once | INTRAVENOUS | Status: AC
  Administered 2022-07-07: 1000 mg via INTRAVENOUS
  Filled 2022-07-07: qty 10

## 2022-07-07 MED ORDER — METHOCARBAMOL 750 MG PO TABS: 750.0000 mg | ORAL_TABLET | Freq: Three times a day (TID) | ORAL | 2 refills | Status: DC | PRN

## 2022-07-07 NOTE — Plan of Care (Signed)

## 2022-07-07 NOTE — Progress Notes (Signed)
Awaiting for a callback from On call Neuro, pt needs foley cath & med reconciliation and AVS foley instructions.

## 2022-07-07 NOTE — Progress Notes (Signed)
Occupational Therapy Treatment Patient Details Name: [name] MRN: [id number] DOB: 28-Jun-1956 Today's Date: 07/07/2022   History of present illness 66 y.o. woman that presents with 2 weeks of severe low back pain after a fall. MRI shows  post-op changes with large fusion mass, fracture through all three columns of L3; s/p OR 12/9 for fracture stabilization.   OT comments  Pt in bed upon therapy arrival and agreeable to OT treatment session. Pt reports that she sat in the recliner for 30 minutes today and she is now experiencing increased lumbar pain. Nursing provided pain meds during session. Pt education completed focus on pain management techniques and lumbar stabilization with the use of diaphragmatic breathing and abdominal bracing. Pt provided with verbal instruction, visual demonstration, and handout for reference. Pt verbalized and demonstrated understanding. Attempted repositioning of BLEs as pt rests in a frog position supine keeping both hips externally rotated. Pt could not tolerate bend knee position with hips internally rotated to neutral and pillows were removed from underneath knees. Acute OT will continue to follow patient to focus on pain management techniques, functional transfers, activity tolerance, and BADL tasks.    Recommendations for follow up therapy are one component of a multi-disciplinary discharge planning process, led by the attending physician.  Recommendations may be updated based on patient status, additional functional criteria and insurance authorization.    Follow Up Recommendations  Skilled nursing-short term rehab (<3 hours/day)     Assistance Recommended at Discharge Frequent or constant Supervision/Assistance  Patient can return home with the following  Two people to help with walking and/or transfers;A lot of help with bathing/dressing/bathroom;Assist for transportation;Help with stairs or ramp for entrance   Equipment Recommendations  Other  (comment) (Defer to next venue of care)    Recommendations for Other Services      Precautions / Restrictions Precautions Precautions: Back Precaution Booklet Issued: No Precaution Comments: "no Brace Needed" in order set for 12/10. log roll technique recommended. Restrictions Weight Bearing Restrictions: No              ADL either performed or assessed with clinical judgement     Vision Baseline Vision/History: 1 Wears glasses Ability to See in Adequate Light: 0 Adequate Patient Visual Report: No change from baseline                        General Comments Silver dollar size bruise noted on right knee with skin abrasion. Pt unaware.    Pertinent Vitals/ Pain       Pain Assessment Pain Assessment: 0-10 Pain Score: 8  Pain Location: low back Pain Descriptors / Indicators: Sore, Shooting, Grimacing, Operative site guarding Pain Intervention(s): Limited activity within patient's tolerance, RN gave pain meds during session, Repositioned         Frequency  Min 2X/week        Progress Toward Goals  OT Goals(current goals can now be found in the care plan section)  Progress towards OT goals: Progressing toward goals     Plan Discharge plan remains appropriate;Frequency remains appropriate       AM-PAC OT "6 Clicks" Daily Activity     Outcome Measure   Help from another person eating meals?: None Help from another person taking care of personal grooming?: A Little Help from another person toileting, which includes using toliet, bedpan, or urinal?: Total Help from another person bathing (including washing, rinsing, drying)?: A Lot Help from another person to put on and  taking off regular upper body clothing?: A Little Help from another person to put on and taking off regular lower body clothing?: Total 6 Click Score: 14    End of Session    OT Visit Diagnosis: Unsteadiness on feet (R26.81);Other abnormalities of gait and mobility (R26.89);Pain Pain  - part of body:  (lower back)   Activity Tolerance Patient tolerated treatment well;Patient limited by pain   Patient Left in bed;with call bell/phone within reach           Time: 3568-6168 OT Time Calculation (min): 27 min  Charges: OT General Charges $OT Visit: 1 Visit OT Treatments $Therapeutic Activity: 23-37 mins  Limmie Patricia, OTR/L,CBIS  Supplemental OT - [facility] and [facility]   Meriah Shands, Charisse March 07/07/2022, 3:41 PM

## 2022-07-07 NOTE — Progress Notes (Signed)
   Providing Compassionate, Quality Care - Together  NEUROSURGERY PROGRESS NOTE   S: No issues overnight. More pain this am after foley placement, complains of uncontrolled pain since IV meds removed for rehab planning  O: EXAM:  BP (!) 143/67 (BP Location: Left Arm)   Pulse 80   Temp 98 F (36.7 C) (Oral)   Resp 16   Ht 5' 0.98" (1.549 m)   Wt 72.6 kg   SpO2 97%   BMI 30.25 kg/m   Awake, alert, oriented  PERRL Speech fluent, appropriate  CNs grossly intact  5/5 BUE/BLE  Wound clean dry and intact   ASSESSMENT:  66 y.o. female with    Status post lumbar revision instrumentation and fusion for fracture   PLAN: -SNF ready, will setup, patient agreeable  -offered further anti inflam medication for pain control which she declined, would like methocarbamol    Thank you for allowing me to participate in this patient's care.  Please do not hesitate to call with questions or concerns.   Monia Pouch, DO Neurosurgeon [facility] Cell: [phone]

## 2022-07-07 NOTE — TOC Progression Note (Signed)
Transition of Care [facility]) - Progression Note    Patient Details  Name: [name] MRN: [id number] Date of Birth: 10-Oct-1955  Transition [facility]) CM/SW Contact  Lorri Frederick, LCSW Phone Number: 07/07/2022, 12:40 PM  Clinical Narrative:   Still awaiting updates for DC summary.  CSW contacted MD office to request call back.  RN has also been reaching out.     Planned Disposition: Skilled Nursing Facility Barriers to Discharge: Continued Medical Work up, SNF Pending bed offer  Expected Discharge Plan and Services In-house Referral: Clinical Social Work   Post Acute Care Choice: Skilled Nursing Facility Living arrangements for the past 2 months: Single Family Home Expected Discharge Date: 07/07/22                                     Social Determinants of Health (SDOH) Interventions SDOH Screenings   Food Insecurity: No Food Insecurity (06/27/2022)  Housing: Low Risk  (06/27/2022)  Transportation Needs: No Transportation Needs (06/27/2022)  Utilities: Not At Risk (06/27/2022)  Tobacco Use: High Risk (06/30/2022)    Readmission Risk Interventions     No data to display

## 2022-07-07 NOTE — Progress Notes (Signed)
   Providing Compassionate, Quality Care - Together  NEUROSURGERY PROGRESS NOTE   S: No issues overnight. State methocarbamol works better  O: EXAM:  BP (!) 143/67 (BP Location: Left Arm)   Pulse 80   Temp 98 F (36.7 C) (Oral)   Resp 16   Ht 5' 0.98" (1.549 m)   Wt 72.6 kg   SpO2 97%   BMI 30.25 kg/m   Awake, alert, oriented  PERRL Speech fluent, appropriate  CNs grossly intact  5/5 BUE/BLE  Wound clean dry and intact   ASSESSMENT:  66 y.o. female with    Status post lumbar revision instrumentation and fusion for fracture   PLAN: -SNF pending      Thank you for allowing me to participate in this patient's care.  Please do not hesitate to call with questions or concerns.   Monia Pouch, DO Neurosurgeon [facility] Cell: [phone]

## 2022-07-07 NOTE — Progress Notes (Signed)
Per Neurosurgeon, hold d/c for today to check surgical site later this evening, pt, made aware.

## 2022-07-07 NOTE — Patient Instructions (Signed)
Diaphragmatic breathing and abdominal bracing Lying on your back, with your knees bent, place your hands on either side of your ribcage. Inhale through your nose, and feel the breath move beneath your hands and in the back of your ribcage. Imagine your ribcage as a balloon -- as you take air in, it expands in all directions. Exhale, and feel your hands sliding toward one another as your ribcage narrows down toward the floor and into the center of the body. Aim to keep your neck, shoulders, and chest relaxed, and feel the back of your rib cage connected to the floor. Maintain your neutral pelvis (neither tucked nor overly arched). Inhale again. Then exhale, imagining a thick belt around your low back and low abdominals. Gently tighten and lift the belt as you feel your deep core engage. Aim for a gentle contraction in these muscles, without gripping or holding on to excess tension. You should be able to have a conversation with someone while contracting.

## 2022-07-07 NOTE — Progress Notes (Signed)
I was called by the nurse this afternoon due to concern for wound dehiscence.  I came by to evaluate the patient's wound and canceled her transfer to rehab today until evaluated by myself.  At this point the wound appears to be slightly erythematous, there is no active drainage, I could not express any fluid to palpation.  There is no fluctuance.  There are areas of slight dehiscence along the superior third and bottom third of the incision superficially.  This does not appear to violate down to the soft tissue layers.  A wet-to-dry dressing was placed.  I will consult the wound care team for evaluation in hopes for further recommendations and likely continued to plan with rehab transfer tomorrow.

## 2022-07-07 NOTE — Progress Notes (Signed)
Physical Therapy Treatment Patient Details Name: [name] MRN: [id number] DOB: 08/05/1955 Today's Date: 07/07/2022   History of Present Illness 66 y.o. woman that presents with 2 weeks of severe low back pain after a fall. MRI shows  post-op changes with large fusion mass, fracture through all three columns of L3; s/p OR 12/9 for fracture stabilization.    PT Comments    Pt received in supine, agreeable to therapy session with encouragement, with emphasis on back precaution instruction, bed mobility and transfer training. With max encouragement, pt able to initiate pivotal steps from bed>chair with RW, but very unsteady and needing maxA with some buckling corrected by external assist of gait belt and RW support. Pt reports significant fatigue once sitting in chair. Noted poor recall of back precautions and body mechanics/sequencing for all functional mobility from previous PT sessions. RN notified her bed hydraulics malfunctioned and secretary ordered a new bed for her room, pt up in chair awaiting RN to bring pain meds and new bed at end of session. Recommend +2 with [name] for return transfer to bed from lower chair height surface. Pt continues to benefit from PT services to progress toward functional mobility goals.    Recommendations for follow up therapy are one component of a multi-disciplinary discharge planning process, led by the attending physician.  Recommendations may be updated based on patient status, additional functional criteria and insurance authorization.  Follow Up Recommendations  Skilled nursing-short term rehab (<3 hours/day) Can patient physically be transported by private vehicle: No   Assistance Recommended at Discharge Frequent or constant Supervision/Assistance  Patient can return home with the following A lot of help with bathing/dressing/bathroom;Assistance with cooking/housework;Assist for transportation;Two people to help with walking and/or  transfers;Direct supervision/assist for medications management;Help with stairs or ramp for entrance   Equipment Recommendations  Rolling walker (2 wheels);BSC/3in1    Recommendations for Other Services       Precautions / Restrictions Precautions Precautions: Back Precaution Booklet Issued: Yes (comment) Precaution Comments: "no Brace Needed" in order set for 12/10 Restrictions Weight Bearing Restrictions: No     Mobility  Bed Mobility Overal bed mobility: Needs Assistance Bed Mobility: Rolling, Sidelying to Sit, Sit to Sidelying Rolling: Min assist Sidelying to sit: Max assist       General bed mobility comments: Step by step cues to roll towards right, reach for rail, bring LEs off bed and elevate trunk. Pt asking for visual demo/assistance, then states "I don't want you to tell me what to do" while therapist giving multimodal cues during trunk rise.    Transfers Overall transfer level: Needs assistance Equipment used: Rolling walker (2 wheels) Transfers: Sit to/from Stand Sit to Stand: From elevated surface, Max assist   Step pivot transfers: Max assist, From elevated surface       General transfer comment: STS x 1 from EOB but pt sits impulsively. Bed height elevated further and pt stood to RW and pivoted a couple feet toward her R side with maxA and mild buckling, able to correct with RW support and PTA assist with gait belt.    Ambulation/Gait                   Stairs             Wheelchair Mobility    Modified Rankin (Stroke Patients Only)       Balance Overall balance assessment: Needs assistance Sitting-balance support: Feet supported, Bilateral upper extremity supported Sitting balance-Leahy Scale: Poor Sitting balance -  Comments: Initially pt leaning posteriorly needing minA progressing to min guard with UE on bed Postural control: Posterior lean, Right lateral lean Standing balance support: During functional activity, Bilateral  upper extremity supported Standing balance-Leahy Scale: Poor Standing balance comment: Poor tolerance due to reported fear of falls and pain, but able to perform weight shifting today in stance and pivotal steps with max encouragement.                            Cognition Arousal/Alertness: Awake/alert Behavior During Therapy: WFL for tasks assessed/performed, Anxious, Agitated Overall Cognitive Status: Impaired/Different from baseline Area of Impairment: Memory, Awareness, Safety/judgement, Problem solving                     Memory: Decreased short-term memory   Safety/Judgement: Decreased awareness of deficits, Decreased awareness of safety Awareness: Intellectual Problem Solving: Slow processing, Decreased initiation, Difficulty sequencing, Requires verbal cues, Requires tactile cues General Comments: hyperfocused on pain, poor attention, impaired memory and recall of precautions, prior therapy attempts, etc. Mild agitation with encouragement but therapist able to overcome her objections with increased time and discussion. Pt often contradictory, asking for instructions on how she is supposed to get up safely, then stating "I don't want you to tell me what to do".        Exercises Other Exercises Other Exercises: supine BLE AROM: ankle pumps, heel slides x5 reps ea Other Exercises: IS x 15 reps (achieves ~500-700 mL at most)    General Comments General comments (skin integrity, edema, etc.): Mild drainage from back incision, RN notified. Pt also requests pain meds. Hydraulics were broken on her bed, so bed flagged and Unit Secretary notified to order her a new bed while she is sitting up in the chair. Pt not happy to sit up more than a few mins, discussed benefits of upright posture for pulmonary clearance and endurance building. Pt requesting feet on floor/not to have legs reclined.      Pertinent Vitals/Pain Pain Assessment Pain Assessment: 0-10 Pain Score: 7   Pain Location: back (incisional area) Pain Descriptors / Indicators: Grimacing, Sore, Operative site guarding, Discomfort, Sharp Pain Intervention(s): Limited activity within patient's tolerance, Monitored during session, Repositioned, Patient requesting pain meds-RN notified, Other (comment) (ice pack next to her, pt defers at this time)           PT Goals (current goals can now be found in the care plan section) Acute Rehab PT Goals Patient Stated Goal: Get pain under control, to stop falling PT Goal Formulation: With patient/family Time For Goal Achievement: 07/11/22 Progress towards PT goals: Progressing toward goals    Frequency    Min 3X/week      PT Plan Current plan remains appropriate       AM-PAC PT "6 Clicks" Mobility   Outcome Measure  Help needed turning from your back to your side while in a flat bed without using bedrails?: A Lot Help needed moving from lying on your back to sitting on the side of a flat bed without using bedrails?: A Lot Help needed moving to and from a bed to a chair (including a wheelchair)?: A Lot Help needed standing up from a chair using your arms (e.g., wheelchair or bedside chair)?: A Lot Help needed to walk in hospital room?: Total Help needed climbing 3-5 steps with a railing? : Total 6 Click Score: 10    End of Session Equipment Utilized During Treatment:  Gait belt Activity Tolerance: Patient tolerated treatment well;Patient limited by pain Patient left: in chair;with call bell/phone within reach;with chair alarm set;Other (comment) (pt requesting LE not to be reclined) Nurse Communication: Mobility status;Patient requests pain meds;Other (comment);Need for lift equipment (use [name] with +2 for return to supine) PT Visit Diagnosis: Unsteadiness on feet (R26.81);Muscle weakness (generalized) (M62.81);Pain Pain - part of body:  (back)     Time: 8938-1017 PT Time Calculation (min) (ACUTE ONLY): 42 min  Charges:  $Therapeutic  Exercise: 8-22 mins $Therapeutic Activity: 23-37 mins                     Charlies Rayburn P., PTA Acute Rehabilitation Services Secure Chat Preferred 9a-5:30pm Office: [phone]    Dorathy Kinsman [facility] 07/07/2022, 11:41 AM

## 2022-07-08 ENCOUNTER — Inpatient Hospital Stay (HOSPITAL_COMMUNITY)
Admission: EM | Admit: 2022-07-08 | Discharge: 2022-07-16 | Disposition: A | Discharge status: Skilled Nursing Facility | Payer: Medicare Other | Source: Skilled Nursing Facility | Attending: Neurological Surgery | Admitting: Neurological Surgery

## 2022-07-08 DIAGNOSIS — D72829 Elevated white blood cell count, unspecified: Secondary | ICD-10-CM | POA: Diagnosis present

## 2022-07-08 DIAGNOSIS — E871 Hypo-osmolality and hyponatremia: Secondary | ICD-10-CM | POA: Diagnosis present

## 2022-07-08 DIAGNOSIS — T8131XA Disruption of external operation (surgical) wound, not elsewhere classified, initial encounter: Principal | ICD-10-CM | POA: Diagnosis present

## 2022-07-08 DIAGNOSIS — E7800 Pure hypercholesterolemia, unspecified: Secondary | ICD-10-CM | POA: Diagnosis present

## 2022-07-08 DIAGNOSIS — E119 Type 2 diabetes mellitus without complications: Secondary | ICD-10-CM | POA: Diagnosis present

## 2022-07-08 DIAGNOSIS — F419 Anxiety disorder, unspecified: Secondary | ICD-10-CM | POA: Diagnosis present

## 2022-07-08 DIAGNOSIS — F1111 Opioid abuse, in remission: Secondary | ICD-10-CM | POA: Diagnosis present

## 2022-07-08 DIAGNOSIS — Z96612 Presence of left artificial shoulder joint: Secondary | ICD-10-CM | POA: Diagnosis present

## 2022-07-08 DIAGNOSIS — Z8249 Family history of ischemic heart disease and other diseases of the circulatory system: Secondary | ICD-10-CM

## 2022-07-08 DIAGNOSIS — Z818 Family history of other mental and behavioral disorders: Secondary | ICD-10-CM

## 2022-07-08 DIAGNOSIS — Z96652 Presence of left artificial knee joint: Secondary | ICD-10-CM | POA: Diagnosis present

## 2022-07-08 DIAGNOSIS — Z90710 Acquired absence of both cervix and uterus: Secondary | ICD-10-CM

## 2022-07-08 DIAGNOSIS — Q210 Ventricular septal defect: Secondary | ICD-10-CM

## 2022-07-08 DIAGNOSIS — F17213 Nicotine dependence, cigarettes, with withdrawal: Secondary | ICD-10-CM | POA: Diagnosis present

## 2022-07-08 DIAGNOSIS — Z981 Arthrodesis status: Secondary | ICD-10-CM

## 2022-07-08 DIAGNOSIS — E0590 Thyrotoxicosis, unspecified without thyrotoxic crisis or storm: Secondary | ICD-10-CM | POA: Diagnosis present

## 2022-07-08 DIAGNOSIS — T8189XA Other complications of procedures, not elsewhere classified, initial encounter: Principal | ICD-10-CM | POA: Diagnosis present

## 2022-07-08 DIAGNOSIS — Z823 Family history of stroke: Secondary | ICD-10-CM

## 2022-07-08 DIAGNOSIS — F32A Depression, unspecified: Secondary | ICD-10-CM | POA: Diagnosis present

## 2022-07-08 DIAGNOSIS — I10 Essential (primary) hypertension: Secondary | ICD-10-CM | POA: Diagnosis present

## 2022-07-08 DIAGNOSIS — S32031D Stable burst fracture of third lumbar vertebra, subsequent encounter for fracture with routine healing: Secondary | ICD-10-CM

## 2022-07-08 DIAGNOSIS — Z96641 Presence of right artificial hip joint: Secondary | ICD-10-CM | POA: Diagnosis present

## 2022-07-08 DIAGNOSIS — K219 Gastro-esophageal reflux disease without esophagitis: Secondary | ICD-10-CM | POA: Diagnosis present

## 2022-07-08 LAB — HIV ANTIBODY (ROUTINE TESTING W REFLEX): HIV Screen 4th Generation wRfx: NONREACTIVE

## 2022-07-08 LAB — CBC
HCT: 31.9 % — ABNORMAL LOW (ref 36.0–46.0)
Hemoglobin: 11.1 g/dL — ABNORMAL LOW (ref 12.0–15.0)
MCH: 32.1 pg (ref 26.0–34.0)
MCHC: 34.8 g/dL (ref 30.0–36.0)
MCV: 92.2 fL (ref 80.0–100.0)
Platelets: 434 10*3/uL — ABNORMAL HIGH (ref 150–400)
RBC: 3.46 MIL/uL — ABNORMAL LOW (ref 3.87–5.11)
RDW: 14.3 % (ref 11.5–15.5)
WBC: 14.2 10*3/uL — ABNORMAL HIGH (ref 4.0–10.5)
nRBC: 0 % (ref 0.0–0.2)

## 2022-07-08 LAB — CBG MONITORING, ED: Glucose-Capillary: 135 mg/dL — ABNORMAL HIGH (ref 70–99)

## 2022-07-08 LAB — GLUCOSE, CAPILLARY: Glucose-Capillary: 135 mg/dL — ABNORMAL HIGH (ref 70–99)

## 2022-07-08 MED ORDER — ROSUVASTATIN CALCIUM 20 MG PO TABS
20.0000 mg | ORAL_TABLET | Freq: Every day | ORAL | Status: DC
  Administered 2022-07-08 – 2022-07-15 (×8): 20 mg via ORAL
  Filled 2022-07-08 (×8): qty 1

## 2022-07-08 MED ORDER — OXYCODONE HCL 5 MG PO TABS
15.0000 mg | ORAL_TABLET | ORAL | Status: DC | PRN
  Administered 2022-07-08: 15 mg via ORAL
  Administered 2022-07-09: 10 mg via ORAL
  Administered 2022-07-09 (×2): 15 mg via ORAL
  Filled 2022-07-08 (×4): qty 3

## 2022-07-08 MED ORDER — OXYCODONE-ACETAMINOPHEN 5-325 MG PO TABS
1.0000 | ORAL_TABLET | Freq: Once | ORAL | Status: AC
  Administered 2022-07-08: 1 via ORAL
  Filled 2022-07-08: qty 1

## 2022-07-08 MED ORDER — TRAZODONE HCL 50 MG PO TABS
100.0000 mg | ORAL_TABLET | Freq: Every day | ORAL | Status: DC
  Administered 2022-07-08 – 2022-07-15 (×8): 100 mg via ORAL
  Filled 2022-07-08 (×8): qty 2

## 2022-07-08 MED ORDER — QUETIAPINE FUMARATE 300 MG PO TABS
600.0000 mg | ORAL_TABLET | Freq: Every day | ORAL | Status: DC
  Administered 2022-07-08 – 2022-07-15 (×8): 600 mg via ORAL
  Filled 2022-07-08 (×11): qty 2

## 2022-07-08 MED ORDER — CARVEDILOL 6.25 MG PO TABS
6.2500 mg | ORAL_TABLET | Freq: Two times a day (BID) | ORAL | Status: DC
  Administered 2022-07-09 – 2022-07-16 (×15): 6.25 mg via ORAL
  Filled 2022-07-08 (×15): qty 1

## 2022-07-08 MED ORDER — MELOXICAM 7.5 MG PO TABS
15.0000 mg | ORAL_TABLET | Freq: Every day | ORAL | Status: DC | PRN
  Administered 2022-07-09 – 2022-07-16 (×3): 15 mg via ORAL
  Filled 2022-07-08 (×4): qty 2

## 2022-07-08 MED ORDER — DOCUSATE SODIUM 100 MG PO CAPS
100.0000 mg | ORAL_CAPSULE | Freq: Two times a day (BID) | ORAL | Status: DC
  Administered 2022-07-09 – 2022-07-16 (×11): 100 mg via ORAL
  Filled 2022-07-08 (×14): qty 1

## 2022-07-08 MED ORDER — ACETAMINOPHEN 650 MG RE SUPP: 650.0000 mg | Freq: Four times a day (QID) | RECTAL | Status: DC | PRN

## 2022-07-08 MED ORDER — BUPROPION HCL ER (XL) 150 MG PO TB24
300.0000 mg | ORAL_TABLET | Freq: Every day | ORAL | Status: DC
  Administered 2022-07-09 – 2022-07-16 (×8): 300 mg via ORAL
  Filled 2022-07-08 (×8): qty 2

## 2022-07-08 MED ORDER — LEVOTHYROXINE SODIUM 88 MCG PO TABS
88.0000 ug | ORAL_TABLET | Freq: Every day | ORAL | Status: DC
  Administered 2022-07-09 – 2022-07-16 (×8): 88 ug via ORAL
  Filled 2022-07-08 (×8): qty 1

## 2022-07-08 MED ORDER — SODIUM CHLORIDE 0.9 % IV SOLN: 250.0000 mL | INTRAVENOUS | Status: DC | PRN

## 2022-07-08 MED ORDER — METHOCARBAMOL 750 MG PO TABS
750.0000 mg | ORAL_TABLET | Freq: Four times a day (QID) | ORAL | Status: DC | PRN
  Administered 2022-07-09 – 2022-07-16 (×18): 750 mg via ORAL
  Filled 2022-07-08 (×18): qty 1

## 2022-07-08 MED ORDER — DONEPEZIL HCL 10 MG PO TABS
10.0000 mg | ORAL_TABLET | Freq: Every morning | ORAL | Status: DC
  Administered 2022-07-09 – 2022-07-16 (×8): 10 mg via ORAL
  Filled 2022-07-08 (×8): qty 1

## 2022-07-08 MED ORDER — ONDANSETRON HCL 4 MG/2ML IJ SOLN
4.0000 mg | Freq: Four times a day (QID) | INTRAMUSCULAR | Status: DC | PRN
  Administered 2022-07-09 – 2022-07-16 (×2): 4 mg via INTRAVENOUS
  Filled 2022-07-08 (×2): qty 2

## 2022-07-08 MED ORDER — METFORMIN HCL 500 MG PO TABS
1000.0000 mg | ORAL_TABLET | Freq: Two times a day (BID) | ORAL | Status: DC
  Administered 2022-07-09 – 2022-07-16 (×15): 1000 mg via ORAL
  Filled 2022-07-08 (×15): qty 2

## 2022-07-08 MED ORDER — ACETAMINOPHEN 325 MG PO TABS
650.0000 mg | ORAL_TABLET | Freq: Four times a day (QID) | ORAL | Status: DC | PRN
  Administered 2022-07-09 – 2022-07-16 (×10): 650 mg via ORAL
  Filled 2022-07-08 (×10): qty 2

## 2022-07-08 MED ORDER — SODIUM CHLORIDE 0.9% FLUSH: 3.0000 mL | INTRAVENOUS | Status: DC | PRN

## 2022-07-08 MED ORDER — HYDRALAZINE HCL 20 MG/ML IJ SOLN: 5.0000 mg | INTRAMUSCULAR | Status: DC | PRN

## 2022-07-08 MED ORDER — FLEET ENEMA 7-19 GM/118ML RE ENEM: 1.0000 | ENEMA | Freq: Once | RECTAL | Status: DC | PRN

## 2022-07-08 MED ORDER — SODIUM CHLORIDE 0.9% FLUSH
3.0000 mL | Freq: Two times a day (BID) | INTRAVENOUS | Status: DC
  Administered 2022-07-09 – 2022-07-16 (×13): 3 mL via INTRAVENOUS

## 2022-07-08 MED ORDER — AMLODIPINE BESYLATE 5 MG PO TABS
5.0000 mg | ORAL_TABLET | Freq: Every morning | ORAL | Status: DC
  Administered 2022-07-09 – 2022-07-16 (×8): 5 mg via ORAL
  Filled 2022-07-08 (×8): qty 1

## 2022-07-08 MED ORDER — ONDANSETRON HCL 4 MG PO TABS
4.0000 mg | ORAL_TABLET | Freq: Four times a day (QID) | ORAL | Status: DC | PRN
  Administered 2022-07-10: 4 mg via ORAL
  Filled 2022-07-08: qty 1

## 2022-07-08 MED ORDER — BISACODYL 5 MG PO TBEC: 5.0000 mg | DELAYED_RELEASE_TABLET | Freq: Every day | ORAL | Status: DC | PRN

## 2022-07-08 MED ORDER — FLUVOXAMINE MALEATE 100 MG PO TABS
200.0000 mg | ORAL_TABLET | Freq: Every day | ORAL | Status: DC
  Administered 2022-07-08 – 2022-07-15 (×8): 200 mg via ORAL
  Filled 2022-07-08 (×11): qty 2

## 2022-07-08 MED ORDER — LOSARTAN POTASSIUM 50 MG PO TABS
100.0000 mg | ORAL_TABLET | Freq: Every day | ORAL | Status: DC
  Administered 2022-07-08 – 2022-07-15 (×8): 100 mg via ORAL
  Filled 2022-07-08 (×8): qty 2

## 2022-07-08 MED ORDER — POLYETHYLENE GLYCOL 3350 17 G PO PACK: 17.0000 g | PACK | Freq: Every day | ORAL | Status: DC | PRN

## 2022-07-08 MED ORDER — INSULIN ASPART 100 UNIT/ML IJ SOLN
0.0000 [IU] | Freq: Three times a day (TID) | INTRAMUSCULAR | Status: DC
  Administered 2022-07-09: 3 [IU] via SUBCUTANEOUS
  Administered 2022-07-11: 2 [IU] via SUBCUTANEOUS
  Administered 2022-07-11 – 2022-07-12 (×3): 3 [IU] via SUBCUTANEOUS
  Administered 2022-07-13 (×2): 2 [IU] via SUBCUTANEOUS
  Administered 2022-07-14: 3 [IU] via SUBCUTANEOUS
  Administered 2022-07-14 – 2022-07-15 (×3): 2 [IU] via SUBCUTANEOUS

## 2022-07-08 NOTE — ED Notes (Signed)
Called pt x2 for vitals check. No response

## 2022-07-08 NOTE — TOC Transition Note (Signed)
Transition of Care [facility]) - CM/SW Discharge Note   Patient Details  Name: [name] MRN: [id number] Date of Birth: 12/31/1955  [facility] CM/SW Contact:  Lorri Frederick, LCSW Phone Number: 07/08/2022, 11:46 AM   Clinical Narrative:   Pt discharging to [facility].  RN call [phone] for report.     Final next level of care: Skilled Nursing Facility Barriers to Discharge: Barriers Resolved   Patient Goals and CMS Choice Patient states their goals for this hospitalization and ongoing recovery are:: walk again CMS Medicare.gov Compare Post Acute Care list provided to:: Patient Choice offered to / list presented to : Patient    Discharge Placement              Patient chooses bed at: [facility] Patient to be transferred to facility by: PTAR Name of family member notified: husband cooper Patient and family notified of of transfer: 07/08/22  Discharge Plan and Services In-house Referral: Clinical Social Work   Post Acute Care Choice: Skilled Nursing Facility                               Social Determinants of Health (SDOH) Interventions     Readmission Risk Interventions     No data to display

## 2022-07-08 NOTE — Consult Note (Signed)
WOC Nurse Consult Note: Reason for Consult: Fall with post op changes and large fusion mass and fracture .  There was concern of that the wound exhibited signs of dehiscence.  Dr Dawley promptly came to the room to assess and felt that there was no dehiscence to soft tissue layers.  WOC team asked to assess and make topical care recommendations.  Wound type: surgical wound Pressure Injury POA: NA Measurement: 10 cm surgical line with scant fibrin noted along distal and proximal ends .   Wound bed: some fibrin noted.  No edges appear unapproximated and no defect noted.  Drainage (amount, consistency, odor) none noted  Periwound:intact Patient is on bed pan when I arrive. I assist her to roll over, perform pericare.  There is a smear of stool only.  She yells out in pain.   I begin to remove the paper tape from her back to assess her surgical site and she yells "Hey, you can do better than that"  I continue slowly removing tape and she screams "forget it, just rip it"  I will recommend silicone foam dressing for atraumatic dressing removal and padding to surgical line.  I secure chat Dr Jake Samples to inquire if he felt medihoney is needed to facilitate debridement and he agrees that NS moist gauze is the best option at this time.  Dressing procedure/placement/frequency: Cleanse lumbar incision line with NS and pat gently dry. Apply NS moist gauze to areas of slough and top with dry gauze.  Cover with silicone foam.  Change NS gauze daily and silicone foam every three days and PRN soilage.  Will not follow at this time.  Please re-consult if needed.  Mike Gip MSN, RN, FNP-BC CWON Wound, Ostomy, Continence Nurse Outpatient [facility] [phone] Pager [phone]

## 2022-07-08 NOTE — Discharge Instructions (Signed)
WET TO DRY DRESSING CHANGES SHOULD BE PERFORMED TWICE DAILY WHILE IN SNF

## 2022-07-08 NOTE — Plan of Care (Signed)
Patient adequate for discharge. Discharge instructions provided to patient and placed in chart for PTAR    Problem: Education: Goal: Ability to verbalize activity precautions or restrictions will improve 07/08/2022 1156 by Rita Ohara D, LPN Outcome: Adequate for Discharge 07/08/2022 1156 by Rita Ohara D, LPN Outcome: Adequate for Discharge Goal: Knowledge of the prescribed therapeutic regimen will improve 07/08/2022 1156 by Rita Ohara D, LPN Outcome: Adequate for Discharge 07/08/2022 1156 by Rita Ohara D, LPN Outcome: Adequate for Discharge Goal: Understanding of discharge needs will improve 07/08/2022 1156 by Rita Ohara D, LPN Outcome: Adequate for Discharge 07/08/2022 1156 by Rita Ohara D, LPN Outcome: Adequate for Discharge   Problem: Activity: Goal: Ability to avoid complications of mobility impairment will improve 07/08/2022 1156 by Rita Ohara D, LPN Outcome: Adequate for Discharge 07/08/2022 1156 by Rita Ohara D, LPN Outcome: Adequate for Discharge Goal: Ability to tolerate increased activity will improve 07/08/2022 1156 by Rita Ohara D, LPN Outcome: Adequate for Discharge 07/08/2022 1156 by Rita Ohara D, LPN Outcome: Adequate for Discharge Goal: Will remain free from falls 07/08/2022 1156 by Rita Ohara D, LPN Outcome: Adequate for Discharge 07/08/2022 1156 by Rita Ohara D, LPN Outcome: Adequate for Discharge   Problem: Bowel/Gastric: Goal: Gastrointestinal status for postoperative course will improve 07/08/2022 1156 by Rita Ohara D, LPN Outcome: Adequate for Discharge 07/08/2022 1156 by Rita Ohara D, LPN Outcome: Adequate for Discharge   Problem: Clinical Measurements: Goal: Ability to maintain clinical measurements within normal limits will improve 07/08/2022 1156 by Rita Ohara D, LPN Outcome: Adequate for Discharge 07/08/2022 1156 by Rita Ohara D, LPN Outcome: Adequate for Discharge Goal: Postoperative  complications will be avoided or minimized 07/08/2022 1156 by Rita Ohara D, LPN Outcome: Adequate for Discharge 07/08/2022 1156 by Rita Ohara D, LPN Outcome: Adequate for Discharge Goal: Diagnostic test results will improve 07/08/2022 1156 by Rita Ohara D, LPN Outcome: Adequate for Discharge 07/08/2022 1156 by Rita Ohara D, LPN Outcome: Adequate for Discharge   Problem: Pain Management: Goal: Pain level will decrease 07/08/2022 1156 by Rita Ohara D, LPN Outcome: Adequate for Discharge 07/08/2022 1156 by Rita Ohara D, LPN Outcome: Adequate for Discharge   Problem: Skin Integrity: Goal: Will show signs of wound healing 07/08/2022 1156 by Rita Ohara D, LPN Outcome: Adequate for Discharge 07/08/2022 1156 by Rita Ohara D, LPN Outcome: Adequate for Discharge   Problem: Health Behavior/Discharge Planning: Goal: Identification of resources available to assist in meeting health care needs will improve 07/08/2022 1156 by Rita Ohara D, LPN Outcome: Adequate for Discharge 07/08/2022 1156 by Rita Ohara D, LPN Outcome: Adequate for Discharge   Problem: Bladder/Genitourinary: Goal: Urinary functional status for postoperative course will improve 07/08/2022 1156 by Rita Ohara D, LPN Outcome: Adequate for Discharge 07/08/2022 1156 by Rita Ohara D, LPN Outcome: Adequate for Discharge   Problem: Education: Goal: Knowledge of General Education information will improve Description: Including pain rating scale, medication(s)/side effects and non-pharmacologic comfort measures 07/08/2022 1156 by Rita Ohara D, LPN Outcome: Adequate for Discharge 07/08/2022 1156 by Rita Ohara D, LPN Outcome: Adequate for Discharge   Problem: Health Behavior/Discharge Planning: Goal: Ability to manage health-related needs will improve 07/08/2022 1156 by Rita Ohara D, LPN Outcome: Adequate for Discharge 07/08/2022 1156 by Rita Ohara D, LPN Outcome: Adequate for  Discharge   Problem: Clinical Measurements: Goal: Ability to maintain clinical measurements within normal limits will improve 07/08/2022 1156 by Rita Ohara D, LPN Outcome: Adequate for Discharge 07/08/2022 1156 by Rita Ohara D, LPN Outcome: Adequate for Discharge  Goal: Will remain free from infection 07/08/2022 1156 by Roetta Sessions D, LPN Outcome: Adequate for Discharge 07/08/2022 1156 by Roetta Sessions D, LPN Outcome: Adequate for Discharge Goal: Diagnostic test results will improve 07/08/2022 1156 by Roetta Sessions D, LPN Outcome: Adequate for Discharge 07/08/2022 1156 by Roetta Sessions D, LPN Outcome: Adequate for Discharge Goal: Respiratory complications will improve 07/08/2022 1156 by Roetta Sessions D, LPN Outcome: Adequate for Discharge 07/08/2022 1156 by Roetta Sessions D, LPN Outcome: Adequate for Discharge Goal: Cardiovascular complication will be avoided 07/08/2022 1156 by Roetta Sessions D, LPN Outcome: Adequate for Discharge 07/08/2022 1156 by Roetta Sessions D, LPN Outcome: Adequate for Discharge   Problem: Activity: Goal: Risk for activity intolerance will decrease 07/08/2022 1156 by Roetta Sessions D, LPN Outcome: Adequate for Discharge 07/08/2022 1156 by Roetta Sessions D, LPN Outcome: Adequate for Discharge   Problem: Nutrition: Goal: Adequate nutrition will be maintained 07/08/2022 1156 by Roetta Sessions D, LPN Outcome: Adequate for Discharge 07/08/2022 1156 by Roetta Sessions D, LPN Outcome: Adequate for Discharge   Problem: Coping: Goal: Level of anxiety will decrease 07/08/2022 1156 by Roetta Sessions D, LPN Outcome: Adequate for Discharge 07/08/2022 1156 by Roetta Sessions D, LPN Outcome: Adequate for Discharge   Problem: Elimination: Goal: Will not experience complications related to bowel motility 07/08/2022 1156 by Roetta Sessions D, LPN Outcome: Adequate for Discharge 07/08/2022 1156 by Roetta Sessions D, LPN Outcome: Adequate for Discharge Goal: Will  not experience complications related to urinary retention 07/08/2022 1156 by Roetta Sessions D, LPN Outcome: Adequate for Discharge 07/08/2022 1156 by Roetta Sessions D, LPN Outcome: Adequate for Discharge   Problem: Pain Managment: Goal: General experience of comfort will improve 07/08/2022 1156 by Roetta Sessions D, LPN Outcome: Adequate for Discharge 07/08/2022 1156 by Roetta Sessions D, LPN Outcome: Adequate for Discharge   Problem: Safety: Goal: Ability to remain free from injury will improve 07/08/2022 1156 by Roetta Sessions D, LPN Outcome: Adequate for Discharge 07/08/2022 1156 by Roetta Sessions D, LPN Outcome: Adequate for Discharge   Problem: Skin Integrity: Goal: Risk for impaired skin integrity will decrease 07/08/2022 1156 by Roetta Sessions D, LPN Outcome: Adequate for Discharge 07/08/2022 1156 by Roetta Sessions D, LPN Outcome: Adequate for Discharge

## 2022-07-08 NOTE — ED Triage Notes (Signed)
Patient BIB [facility] from [facility] for evaluation of surgical site. Patient was discharged at 1300 today and arrived at [facility] and a nurse at the rehab facility stated the wound might be "leaking too much" and the nurse was sent to ED for evaluation. Patient is alert, oriented, and in no apparent distress. BP 154/94, HR 96, 99% on room air, CBG 187

## 2022-07-08 NOTE — ED Notes (Signed)
Called for pt x3 for vitals check. No response.

## 2022-07-08 NOTE — ED Provider Triage Note (Signed)
Emergency Medicine Provider Triage Evaluation Note  [name] , a 66 y.o. female  was evaluated in triage.  Pt complains of drainage from her postop wound.  Patient was admitted to the hospital on December 8 and discharged earlier today due to an L3 burst fracture with instability.  Patient underwent fusion from L1-L5.  Wound with reported slight superficial superior inferior dehiscence which did not connect to deeper layers based on discharge summary.  Wound care evaluated the patient and recommended twice daily wet-to-dry dressing changes while skilled nursing facility.  Patient arrived at [facility] earlier this afternoon who believes the wound was "leaking too much" and sent her back to the emergency department for reevaluation.  Review of Systems  Positive: As above Negative: As above  Physical Exam  BP (!) 147/83 (BP Location: Right Arm)   Pulse 76   Resp 16   SpO2 96%  Gen:   Awake, no distress   Resp:  Normal effort  MSK:   Moves extremities without difficulty  Other:  Wounds on her back bandaged, no drainage outside of bandages at this time  Medical Decision Making  Medically screening exam initiated at 4:26 PM.  Appropriate orders placed.  DARSHAY DEUPREE was informed that the remainder of the evaluation will be completed by another provider, this initial triage assessment does not replace that evaluation, and the importance of remaining in the ED until their evaluation is complete.     Darrick Grinder, PA-C 07/08/22 [phone]

## 2022-07-08 NOTE — H&P (Signed)
[name] is an 66 y.o. female.   Chief Complaint: Back pain/Delayed wound healing HPI: [name] underwent surgery to stabilize an L3 burst fracture by Dr. Zada Finders on 06/26/2022. Her postoperative course was complicated by pain control, leading to decreased mobilization. The patient ultimately decided to discharge to a skilled nursing facility. On the planned day of discharge, the bedside RN contacted Neurosurgery with concerns about wound dehiscence. The surgical wound was examined by Dr. Reatha Armour and a wound nurse. The slight separation of the surgical wound was felt to be superficial and wet-to-dry dressing changes were recommended at discharge. On 07/08/2022, the patient was discharged to [facility]. Upon arrival, the admitting nurses examined the surgical wound and felt there was too much drainage. The patient was transported via ambulance back to the [facility] emergency department. [name] has remained afebrile. She denies any symptoms of infection. Her situation and exam remain unchanged from when she was discharged earlier today. The patient is being admitted for pain control and further discharge planning.  Past Medical History:  Diagnosis Date   Anemia    Anxiety    Arthritis    Depression    GERD (gastroesophageal reflux disease)    Heart murmur    "related to VSD"   High cholesterol    History of blood transfusion    "related to OR" (08/19/2016)   History of hiatal hernia    Hypertension    Hyperthyroidism    Mild cognitive impairment 09/06/2018   Paroxysmal ventricular tachycardia (HCC)    Type II diabetes mellitus ([place])    UTI (urinary tract infection)    being treated with Keflex   Ventricular septal defect     Past Surgical History:  Procedure Laterality Date   ABDOMINAL HYSTERECTOMY     BACK SURGERY     CARDIAC CATHETERIZATION N/A 06/23/2015   Procedure: Left Heart Cath and Coronary Angiography;  Surgeon: Larey Dresser, MD;  Location: [place] [facility];  Service: Cardiovascular;  Laterality: N/A;   CARDIAC CATHETERIZATION  1960   "VSD was so small; didn't need repaired"   EXAM UNDER ANESTHESIA WITH MANIPULATION OF HIP Right 06/02/2014   dr Rhona Raider   FRACTURE SURGERY     HERNIA REPAIR     HIP CLOSED REDUCTION Right 06/02/2014   Procedure: CLOSED MANIPULATION HIP;  Surgeon: Hessie Dibble, MD;  Location: [place];  Service: Orthopedics;  Laterality: Right;   JOINT REPLACEMENT     JOINT REPLACEMENT     POSTERIOR LUMBAR FUSION 4 LEVEL N/A 06/26/2022   Procedure: Lumbar One To Lumbar Five Posterior Instrumented Fusion;  Surgeon: Judith Part, MD;  Location: [place];  Service: Neurosurgery;  Laterality: N/A;   REFRACTIVE SURGERY Bilateral    SHOULDER ARTHROSCOPY Right    SHOULDER OPEN ROTATOR CUFF REPAIR Right    SPINAL FUSION  1996   "t10 down to my coccyx   SPINE HARDWARE REMOVAL     TOTAL ABDOMINAL HYSTERECTOMY     TOTAL HIP ARTHROPLASTY Right 05/10/2014   [place]      by dr Harrell Gave [place]   TOTAL KNEE ARTHROPLASTY Left    TOTAL KNEE ARTHROPLASTY Left    TOTAL SHOULDER ARTHROPLASTY Left 08/19/2016   TOTAL SHOULDER ARTHROPLASTY Left 08/19/2016   Procedure: TOTAL SHOULDER ARTHROPLASTY;  Surgeon: Tania Ade, MD;  Location: [place];  Service: Orthopedics;  Laterality: Left;  Left total shoulder replacement    Family History  Problem Relation Age of Onset   Other  Mother        alive   Stroke Father 28       deceased   Dementia Father    Chorea Maternal Grandfather    Dementia Maternal Aunt    Dementia Maternal Aunt    Heart attack Other        multiple uncles have died with myocardial infarction   Social History:  reports that she has been smoking cigarettes. She has never used smokeless tobacco. She reports that she does not drink alcohol and does not use drugs.  Allergies: No Known Allergies  (Not in a hospital admission)   Results for orders placed or performed during the hospital encounter of 06/25/22  (from the past 48 hour(s))  Glucose, capillary     Status: Abnormal   Collection Time: 07/07/22  8:41 AM  Result Value Ref Range   Glucose-Capillary 139 (H) 70 - 99 mg/dL    Comment: Glucose reference range applies only to samples taken after fasting for at least 8 hours.  Glucose, capillary     Status: Abnormal   Collection Time: 07/07/22 12:00 PM  Result Value Ref Range   Glucose-Capillary 130 (H) 70 - 99 mg/dL    Comment: Glucose reference range applies only to samples taken after fasting for at least 8 hours.  Glucose, capillary     Status: Abnormal   Collection Time: 07/07/22  4:56 PM  Result Value Ref Range   Glucose-Capillary 132 (H) 70 - 99 mg/dL    Comment: Glucose reference range applies only to samples taken after fasting for at least 8 hours.  Glucose, capillary     Status: Abnormal   Collection Time: 07/07/22  8:04 PM  Result Value Ref Range   Glucose-Capillary 152 (H) 70 - 99 mg/dL    Comment: Glucose reference range applies only to samples taken after fasting for at least 8 hours.  Glucose, capillary     Status: Abnormal   Collection Time: 07/08/22  8:17 AM  Result Value Ref Range   Glucose-Capillary 135 (H) 70 - 99 mg/dL    Comment: Glucose reference range applies only to samples taken after fasting for at least 8 hours.   No results found.  Review of Systems  Constitutional:  Positive for activity change.  HENT: Negative.    Eyes: Negative.   Respiratory: Negative.    Cardiovascular: Negative.   Gastrointestinal: Negative.   Endocrine: Negative.   Genitourinary: Negative.   Musculoskeletal:  Positive for arthralgias, back pain, gait problem and myalgias.  Skin:        Surgical wound  Allergic/Immunologic: Negative.   Neurological:  Positive for weakness.  Hematological: Negative.   Psychiatric/Behavioral: Negative.      Blood pressure (!) 151/81, pulse 89, temperature 98.6 F (37 C), resp. rate 17, SpO2 98 %. Physical Exam Constitutional:       Appearance: Normal appearance.  HENT:     Head: Normocephalic and atraumatic.     Nose: Nose normal.     Mouth/Throat:     Mouth: Mucous membranes are dry.     Pharynx: Oropharynx is clear.  Eyes:     Extraocular Movements: Extraocular movements intact.     Conjunctiva/sclera: Conjunctivae normal.     Pupils: Pupils are equal, round, and reactive to light.  Cardiovascular:     Rate and Rhythm: Normal rate and regular rhythm.     Pulses: Normal pulses.  Pulmonary:     Effort: Pulmonary effort is normal.  Abdominal:  Palpations: Abdomen is soft.  Musculoskeletal:     Cervical back: Normal range of motion and neck supple.     Right knee: Bony tenderness present. Decreased range of motion.  Skin:    General: Skin is warm and dry.     Capillary Refill: Capillary refill takes less than 2 seconds.       Neurological:     General: No focal deficit present.     Mental Status: She is alert and oriented to person, place, and time.  Psychiatric:        Attention and Perception: Attention normal.        Mood and Affect: Mood is anxious.        Speech: Speech normal.        Behavior: Behavior normal. Behavior is cooperative.        Thought Content: Thought content normal.        Cognition and Memory: Cognition normal.      Assessment/Plan Patient returned from [facility] due to the facility nurses' concerns regarding the surgical wound. The patient is without signs and symptoms of infection. The patient will be admitted for pain control and to work with therapies while discharge planning is revisited.  Floreen Comber, NP 07/08/2022, 8:06 PM

## 2022-07-08 NOTE — Progress Notes (Signed)
Pt can return to [facility] tomorrow (07/09/2022) morning per DON, Nehemiah Settle and Hollins, Dud[place].  Rindy Kollman Tarpley-Carter, MSW, LCSW-A Pronouns:  She/Her/Hers [facility] HealthTransitions of Care Clinical Social Worker Direct Number:  [phone] [email or web address]

## 2022-07-09 LAB — BASIC METABOLIC PANEL
Anion gap: 14 (ref 5–15)
BUN: 7 mg/dL — ABNORMAL LOW (ref 8–23)
CO2: 27 mmol/L (ref 22–32)
Calcium: 8.5 mg/dL — ABNORMAL LOW (ref 8.9–10.3)
Chloride: 91 mmol/L — ABNORMAL LOW (ref 98–111)
Creatinine, Ser: 0.47 mg/dL (ref 0.44–1.00)
GFR, Estimated: >60 mL/min (ref >60)
Glucose, Bld: 102 mg/dL — ABNORMAL HIGH (ref 70–99)
Potassium: 2.2 mmol/L — CL (ref 3.5–5.1)
Sodium: 132 mmol/L — ABNORMAL LOW (ref 135–145)

## 2022-07-09 LAB — GLUCOSE, CAPILLARY
Glucose-Capillary: 121 mg/dL — ABNORMAL HIGH (ref 70–99)
Glucose-Capillary: 122 mg/dL — ABNORMAL HIGH (ref 70–99)
Glucose-Capillary: 124 mg/dL — ABNORMAL HIGH (ref 70–99)
Glucose-Capillary: 151 mg/dL — ABNORMAL HIGH (ref 70–99)
Glucose-Capillary: 87 mg/dL (ref 70–99)

## 2022-07-09 MED ORDER — OXYCODONE HCL 5 MG PO TABS
10.0000 mg | ORAL_TABLET | ORAL | Status: DC | PRN
  Administered 2022-07-09 – 2022-07-14 (×19): 10 mg via ORAL
  Filled 2022-07-09 (×19): qty 2

## 2022-07-09 MED ORDER — POTASSIUM CHLORIDE 10 MEQ/100ML IV SOLN
10.0000 meq | INTRAVENOUS | Status: AC
  Administered 2022-07-09 – 2022-07-10 (×2): 10 meq via INTRAVENOUS
  Filled 2022-07-09: qty 100

## 2022-07-09 MED ORDER — POTASSIUM CHLORIDE 10 MEQ/100ML IV SOLN
10.0000 meq | INTRAVENOUS | Status: AC
  Administered 2022-07-09 (×2): 10 meq via INTRAVENOUS
  Filled 2022-07-09 (×3): qty 100

## 2022-07-09 NOTE — Care Management Obs Status (Signed)
MEDICARE OBSERVATION STATUS NOTIFICATION   Patient Details  Name: [name] MRN: [id number] Date of Birth: 02/09/1956   Medicare Observation Status Notification Given:  Yes    Kingsley Plan, RN 07/09/2022, 2:28 PM

## 2022-07-09 NOTE — Progress Notes (Addendum)
12:10pm: CSW spoke with RN and admissions staff at [facility] who state the picture of the patient's wound must be uploaded into the patient's chart in Epic as they will not accept a picture from the patient's daughter.  The patient's insurance authorization is approved through midnight tonight. If the patient does not go back to the facility before midnight, a new authorization will be required.  CSW spoke with Dawn, WOC RN who states she does not have the capability to [employer] into Epic. Dawn states she has contacted neurosurgery to have them come evaluate patient.  10:25am: CSW spoke with Lawerance Cruel at [facility] who states the facility was not aware of the severity of the patient's wound upon her arrival at the facility yesterday. Lawerance Cruel states the DON and administrator are requesting a picture of the wound be added to the media tab prior to allowing the patient to return. Lawerance Cruel is working to determine if a new SNF authorization is required.  Edwin Dada, MSW, LCSW Transitions of Care  Clinical Social Worker II [phone]

## 2022-07-09 NOTE — Plan of Care (Addendum)
0700: Pt resting in bed at beginning of shift, pt alert and oriented x4, pt forgetful at times, pt daughter at bedside, pt on room air, pt bed alarm in place, bed in lowest position, and call bell within reach    RN bladder scanned pt around 10 am pt showing greater than 999 ml. Per nightshirt RN pt retaining overnight and was needing to be straighten-cathed, per nightshift RN neurosurgery told night shift RN that if pt retains again and is unable to void, RN may place a foley.   1330: RN placed 14 french foley on pt, pt was unable to void on her own.  RN called on call neurosurgery office to speak with Dr.Dawley, RN unable to reach anyone and left a voicemail, RN received a call back from Dr.Dawley's assistant, RN notified assistant to make Dr.Dawley aware, pt needing foley/pt retaining urine, pt daughter at bedside and wanting to speak with MD/or have MD round on pt, and pt needing a picture of wound sent to pt's facility before facility will accept pt back per case manager. Providers' assistant to relay message to provider. RN made pt daughter and pt aware.   Pt seen by wound care, picture was taken of wound by pt daughter per wound request to send to facility but unable to send at this time, Wound care RN notified on call provider, provider needing to come look at wound. Dressing change performed by wound care, daily dressing change orders in place.

## 2022-07-09 NOTE — Progress Notes (Signed)
   Providing Compassionate, Quality Care - Together  NEUROSURGERY PROGRESS NOTE   S: patient returns with wound drainage  O: EXAM:  BP (!) 144/88 (BP Location: Right Arm)   Pulse 91   Temp 97.8 F (36.6 C) (Oral)   Resp 16   SpO2 99%   Awake, alert, oriented x3 PERRL Speech fluent, appropriate  Flat affect CNs grossly intact  5/5 BUE/BLE  Wound has superficial dehiscence superiorly and inferiorly, there is some mild serosanguineous drainage from the inferior portion.  Dressing was changed, and reinforced.  ASSESSMENT:  66 y.o. female with   Lumbar wound dehiscence, mild  PLAN: -Erythema appears mildly increased, no active pus, or expressible fluid. -Recommend continuing twice daily wet-to-dry wound bandage changes -Spent extensive amount of time with the patient, her family members discussing her need to increase her activity full recovery, lay off of her wound, minimize use of bedpan and use bedside commode. -Agree with planning to wean pain medication every 24-48 hours to minimize somnolence.  Patient does have a history of narcotic abuse and therefore does not need IV pain medication at this time as her immediate postoperative pain is is resolved and should be controlled with oral medication only   Thank you for allowing me to participate in this patient's care.  Please do not hesitate to call with questions or concerns.   Monia Pouch, DO Neurosurgeon [facility] Cell: [phone]

## 2022-07-09 NOTE — Evaluation (Signed)
Physical Therapy Evaluation Patient Details Name: [name] MRN: [id number] DOB: September 25, 1955 Today's Date: 07/09/2022  History of Present Illness  66 y.o. female admitted 12/9 with fall, back pain x 2 wks and L3 burst fx s/p stabilization 12/9. Pt with wound dehiscence but D/C to [facility] place 12/21. Facility would not admit due to wound drainage and pt returned to ED. PMhx: DM, HTN, anxiety, HTN, arthritis, Lt TSA  Clinical Impression  Pt with flat affect, decreased desire to mobilize and needs max encouragement and reassurance to mobilize. Pt limited by pain, weakness, impaired balance and decreased cognition. Pt will continue to benefit from acute therapy to maximize mobility, safety and function to decrease burden of care. Encouraged OOB daily with nursing staff and up for meals.      Recommendations for follow up therapy are one component of a multi-disciplinary discharge planning process, led by the attending physician.  Recommendations may be updated based on patient status, additional functional criteria and insurance authorization.  Follow Up Recommendations Skilled nursing-short term rehab (<3 hours/day) Can patient physically be transported by private vehicle: No    Assistance Recommended at Discharge Frequent or constant Supervision/Assistance  Patient can return home with the following  A lot of help with bathing/dressing/bathroom;Assistance with cooking/housework;Assist for transportation;Two people to help with walking and/or transfers;Direct supervision/assist for medications management;Help with stairs or ramp for entrance    Equipment Recommendations Rolling walker (2 wheels);BSC/3in1  Recommendations for Other Services       Functional Status Assessment Patient has had a recent decline in their functional status and/or demonstrates limited ability to make significant improvements in function in a reasonable and predictable amount of time     Precautions /  Restrictions Precautions Precautions: Back;Fall Precaution Comments: no brace      Mobility  Bed Mobility Overal bed mobility: Needs Assistance Bed Mobility: Rolling, Sidelying to Sit Rolling: Min assist Sidelying to sit: Mod assist       General bed mobility comments: min assist to roll with max cues for sequence, hand placement, precautions and direction. Use of pad to roll. Physical assist to clear legs off surface and elevate trunk. Max assist for anterior translation due to heavy posterior lean and lack of awareness of midline despite cues    Transfers Overall transfer level: Needs assistance   Transfers: Sit to/from Stand, Bed to chair/wheelchair/BSC Sit to Stand: Mod assist, +2 physical assistance Stand pivot transfers: Mod assist, +2 physical assistance         General transfer comment: mod +2 to stand from bed with use of pad to cradle sacrum with max cues for stepping and weight shifting to pivot to chair. Stood additional time from chair with pt denying attempting any gait or further transfers    Ambulation/Gait                  Information systems manager [place] (Stroke Patients Only)       Balance Overall balance assessment: Needs assistance   Sitting balance-Leahy Scale: Poor Sitting balance - Comments: posterior lean   Standing balance support: Bilateral upper extremity supported, During functional activity Standing balance-Leahy Scale: Poor Standing balance comment: bil UE support and sacral support of pad to stand                             Pertinent Vitals/Pain Pain Assessment Pain  Score: 7  Pain Location: low back Pain Descriptors / Indicators: Aching, Grimacing Pain Intervention(s): Limited activity within patient's tolerance, Repositioned, Monitored during session    Home Living Family/patient expects to be discharged to:: Private residence Living Arrangements: Spouse/significant  other Available Help at Discharge: Family;Available PRN/intermittently Type of Home: House Home Access: Ramped entrance       Home Layout: One level Home Equipment: Shower seat - built in;Hand held Financial trader (4 wheels);Rolling Walker (2 wheels);Wheelchair - manual      Prior Function Prior Level of Function : Needs assist             Mobility Comments: RW       Hand Dominance        Extremity/Trunk Assessment   Upper Extremity Assessment Upper Extremity Assessment: Generalized weakness    Lower Extremity Assessment Lower Extremity Assessment: Generalized weakness    Cervical / Trunk Assessment Cervical / Trunk Assessment: Back Surgery;Kyphotic;Other exceptions Cervical / Trunk Exceptions: scoliosis  Communication   Communication: No difficulties  Cognition Arousal/Alertness: Awake/alert Behavior During Therapy: Flat affect Overall Cognitive Status: Impaired/Different from baseline Area of Impairment: Following commands, Safety/judgement, Awareness, Problem solving                     Memory: Decreased short-term memory, Decreased recall of precautions Following Commands: Follows one step commands inconsistently, Follows one step commands with increased time Safety/Judgement: Decreased awareness of deficits, Decreased awareness of safety   Problem Solving: Slow processing, Requires verbal cues, Requires tactile cues, Decreased initiation General Comments: pt very focues on pain and anticipation of pain, lacks awareness of safety with heavy posterior lean with transition to sitting and no awareness of midline        General Comments      Exercises     Assessment/Plan    PT Assessment Patient needs continued PT services  PT Problem List Decreased strength;Decreased range of motion;Decreased activity tolerance;Decreased balance;Decreased mobility;Decreased coordination;Decreased cognition;Decreased knowledge of use of  DME;Decreased safety awareness;Decreased knowledge of precautions;Pain       PT Treatment Interventions DME instruction;Gait training;Stair training;Functional mobility training;Therapeutic activities;Therapeutic exercise;Balance training;Patient/family education;Cognitive remediation    PT Goals (Current goals can be found in the Care Plan section)  Acute Rehab PT Goals Patient Stated Goal: be able to get up PT Goal Formulation: With patient/family Time For Goal Achievement: 07/23/22 Potential to Achieve Goals: Fair    Frequency Min 3X/week     Co-evaluation               AM-PAC PT "6 Clicks" Mobility  Outcome Measure Help needed turning from your back to your side while in a flat bed without using bedrails?: A Little Help needed moving from lying on your back to sitting on the side of a flat bed without using bedrails?: A Lot Help needed moving to and from a bed to a chair (including a wheelchair)?: Total Help needed standing up from a chair using your arms (e.g., wheelchair or bedside chair)?: Total Help needed to walk in hospital room?: Total Help needed climbing 3-5 steps with a railing? : Total 6 Click Score: 9    End of Session   Activity Tolerance: Patient limited by pain Patient left: in chair;with call bell/phone within reach;with chair alarm set;with family/visitor present;with nursing/sitter in room Nurse Communication: Mobility status;Need for lift equipment PT Visit Diagnosis: Unsteadiness on feet (R26.81);Muscle weakness (generalized) (M62.81);Pain    Time: 4680-3212 PT Time Calculation (min) (ACUTE ONLY): 17 min  Charges:   PT Evaluation $PT Eval Moderate Complexity: 1 Mod          Aashritha Miedema P, PT Acute Rehabilitation Services Office: [phone]   Cristine Polio 07/09/2022, 8:51 AM

## 2022-07-09 NOTE — Evaluation (Signed)
Occupational Therapy Evaluation Patient Details Name: [name] MRN: [id number] DOB: October 24, 1955 Today's Date: 07/09/2022   History of Present Illness 66 y.o. female admitted 12/9 with fall, back pain x 2 wks and L3 burst fx s/p stabilization 12/9. Pt with wound dehiscence but D/C to [facility] place 12/21. Facility would not admit due to wound drainage and pt returned to ED. PMhx: DM, HTN, anxiety, HTN, arthritis, Lt TSA   Clinical Impression   Pt admitted with the above diagnoses and presents with below problem list. Pt will benefit from continued acute OT to address the below listed deficits and maximize independence with basic ADLs prior to d/c to venue below. Unclear PLOF but pt was at least mod I with basic ADLs prior to events that led up to prior hospitalization. Daughter present at start of session. Pt currently needs up to max A+2 with LB ADLs, mod-max A with UB ADLs in unsupported sitting. Pt with posterior lean in static sitting needs min to mod A in unsupported sitting.        Recommendations for follow up therapy are one component of a multi-disciplinary discharge planning process, led by the attending physician.  Recommendations may be updated based on patient status, additional functional criteria and insurance authorization.   Follow Up Recommendations  Skilled nursing-short term rehab (<3 hours/day)     Assistance Recommended at Discharge Frequent or constant Supervision/Assistance  Patient can return home with the following Two people to help with walking and/or transfers;A lot of help with bathing/dressing/bathroom;Assist for transportation;Help with stairs or ramp for entrance    Functional Status Assessment  Patient has had a recent decline in their functional status and demonstrates the ability to make significant improvements in function in a reasonable and predictable amount of time.  Equipment Recommendations  Other (comment) (defer to next venue)     Recommendations for Other Services       Precautions / Restrictions Precautions Precautions: Back;Fall Precaution Booklet Issued: No Precaution Comments: no brace Restrictions Weight Bearing Restrictions: No      Mobility Bed Mobility Overal bed mobility: Needs Assistance Bed Mobility: Rolling, Sidelying to Sit, Sit to Sidelying Rolling: Max assist Sidelying to sit: Mod assist, +2 for safety/equipment     Sit to sidelying: Mod assist, +2 for safety/equipment General bed mobility comments: bed pad utiilized by therapist to physically assist pt to roll to pt's rt side. Pt able to reach for grab bar. Assist to advance BLE onto bed and guard trunk/head to return to supine    Transfers Overall transfer level: Needs assistance Equipment used: Rolling walker (2 wheels) Transfers: Sit to/from Stand Sit to Stand: Mod assist, +2 physical assistance           General transfer comment: to/from EOB. bed height elevated a bit. heavy use of pad. Pt fearful of falling. Cleared hips/buttocks but just shy of full upright stand, maintained trunk flexion to stand briefly      Balance Overall balance assessment: Needs assistance Sitting-balance support: Feet supported, Bilateral upper extremity supported Sitting balance-Leahy Scale: Poor Sitting balance - Comments: posterior lean   Standing balance support: Bilateral upper extremity supported, During functional activity Standing balance-Leahy Scale: Poor Standing balance comment: bil UE support and sacral support of pad to stand                           ADL either performed or assessed with clinical judgement   ADL Overall ADL's : Needs  assistance/impaired Eating/Feeding: Independent;Bed level   Grooming: Set up;Bed level   Upper Body Bathing: Sitting;Moderate assistance   Lower Body Bathing: Maximal assistance;+2 for physical assistance;Sitting/lateral leans;Sit to/from stand   Upper Body Dressing : Maximal  assistance;Sitting   Lower Body Dressing: Total assistance;Sitting/lateral leans;Bed level                 General ADL Comments: Pt with posterior lean and needing min-mod A in static unsupported, sitting.     Vision         Perception     Praxis      Pertinent Vitals/Pain Pain Assessment Pain Assessment: Faces Faces Pain Scale: Hurts even more Pain Location: low back Pain Descriptors / Indicators: Aching, Grimacing Pain Intervention(s): Monitored during session, Repositioned, Limited activity within patient's tolerance     Hand Dominance     Extremity/Trunk Assessment Upper Extremity Assessment Upper Extremity Assessment: Generalized weakness   Lower Extremity Assessment Lower Extremity Assessment: Defer to PT evaluation   Cervical / Trunk Assessment Cervical / Trunk Assessment: Back Surgery;Kyphotic;Other exceptions Cervical / Trunk Exceptions: scoliosis   Communication Communication Communication: No difficulties   Cognition Arousal/Alertness: Awake/alert Behavior During Therapy: Flat affect Overall Cognitive Status: Impaired/Different from baseline Area of Impairment: Following commands, Safety/judgement, Awareness, Problem solving                     Memory: Decreased short-term memory, Decreased recall of precautions Following Commands: Follows one step commands inconsistently, Follows one step commands with increased time Safety/Judgement: Decreased awareness of deficits, Decreased awareness of safety Awareness: Intellectual Problem Solving: Slow processing, Requires verbal cues, Requires tactile cues, Decreased initiation       General Comments  Daughter present at start of session    Exercises     Shoulder Instructions      Home Living Family/patient expects to be discharged to:: Skilled nursing facility Living Arrangements: Spouse/significant other Available Help at Discharge: Family;Available PRN/intermittently Type of Home:  House Home Access: Ramped entrance     Home Layout: One level     Bathroom Shower/Tub: Producer, television/film/video: Handicapped height     Home Equipment: Shower seat - built in;Hand held Financial trader (4 wheels);Rolling Walker (2 wheels);Wheelchair - manual          Prior Functioning/Environment Prior Level of Function : Needs assist             Mobility Comments: RW          OT Problem List: Decreased activity tolerance;Decreased strength;Impaired balance (sitting and/or standing);Decreased cognition;Decreased knowledge of use of DME or AE;Decreased safety awareness;Decreased knowledge of precautions;Pain      OT Treatment/Interventions: Self-care/ADL training;Energy conservation;Therapeutic exercise;DME and/or AE instruction;Therapeutic activities;Patient/family education    OT Goals(Current goals can be found in the care plan section) Acute Rehab OT Goals Patient Stated Goal: pain control OT Goal Formulation: With patient Time For Goal Achievement: 07/23/22 Potential to Achieve Goals: Good ADL Goals Pt Will Perform Grooming: with min guard assist;sitting Pt Will Perform Upper Body Dressing: with min guard assist;sitting Pt Will Perform Lower Body Dressing: sit to/from stand;with mod assist Pt Will Transfer to Toilet: with min assist;stand pivot transfer;bedside commode  OT Frequency: Min 2X/week    Co-evaluation              AM-PAC OT "6 Clicks" Daily Activity     Outcome Measure Help from another person eating meals?: None Help from another person taking care of personal grooming?: A  Little Help from another person toileting, which includes using toliet, bedpan, or urinal?: Total Help from another person bathing (including washing, rinsing, drying)?: A Lot Help from another person to put on and taking off regular upper body clothing?: A Lot Help from another person to put on and taking off regular lower body clothing?: Total 6  Click Score: 13   End of Session Equipment Utilized During Treatment: Rolling walker (2 wheels)  Activity Tolerance: Patient limited by pain;Patient limited by fatigue Patient left: in bed;with call bell/phone within reach;Other (comment) (wound care nursing staff arrived at end of session)  OT Visit Diagnosis: Unsteadiness on feet (R26.81);Other abnormalities of gait and mobility (R26.89);Pain                Time: 0131-4388 OT Time Calculation (min): 14 min Charges:  OT General Charges $OT Visit: 1 Visit OT Evaluation $OT Eval Moderate Complexity: 1 Mod  Raynald Kemp, OT Acute Rehabilitation Services Office: [phone]   Pilar Grammes 07/09/2022, 11:42 AM

## 2022-07-09 NOTE — Consult Note (Signed)
WOC Nurse Consult Note: Reason for Consult: Neurosurgery team is following for assessment and plan of care.  Requested to assess back wound and provide topical treatment recommendations.  Posterior spine post-op incision is dehisced in patchy areas.  Affected area is 10X.2X.2cm, yellow and moist. Mod amt tan drainage, erythremia surrounding the location.   Dressing procedure/placement/frequency: Aquacel to absorb drainage and provide antimicrobial benefits until further recommendations are available from the neurosurgical team.  Called the office to discuss my orders and convey the need to assess the wound by their team.  Daughter in the room to assess wound, take a photo on her phone to send to the SNF facility as requested, and discuss plan of care.  Please re-consult if further assistance is needed.  Thank-you,  Cammie Mcgee MSN, RN, CWOCN, [place], CNS [phone]

## 2022-07-09 NOTE — Care Management CC44 (Signed)
Condition Code 44 Documentation Completed  Patient Details  Name: ALYISSA WHIDBEE MRN: [id number] Date of Birth: 04/12/1956   Condition Code 44 given:  Yes Patient signature on Condition Code 44 notice:    Documentation of 2 MD's agreement:  Yes Code 44 added to claim:  Yes   Discussed with patient . Patient currently declined to sign. Requested hard copy.   Hard copy given  Kingsley Plan, RN 07/09/2022, 2:28 PM

## 2022-07-10 ENCOUNTER — Observation Stay (HOSPITAL_COMMUNITY): Payer: Medicare Other

## 2022-07-10 DIAGNOSIS — F419 Anxiety disorder, unspecified: Secondary | ICD-10-CM | POA: Diagnosis present

## 2022-07-10 DIAGNOSIS — Z96652 Presence of left artificial knee joint: Secondary | ICD-10-CM | POA: Diagnosis present

## 2022-07-10 DIAGNOSIS — Z823 Family history of stroke: Secondary | ICD-10-CM | POA: Diagnosis not present

## 2022-07-10 DIAGNOSIS — E0590 Thyrotoxicosis, unspecified without thyrotoxic crisis or storm: Secondary | ICD-10-CM | POA: Diagnosis present

## 2022-07-10 DIAGNOSIS — K219 Gastro-esophageal reflux disease without esophagitis: Secondary | ICD-10-CM | POA: Diagnosis present

## 2022-07-10 DIAGNOSIS — E7800 Pure hypercholesterolemia, unspecified: Secondary | ICD-10-CM | POA: Diagnosis present

## 2022-07-10 DIAGNOSIS — E871 Hypo-osmolality and hyponatremia: Secondary | ICD-10-CM | POA: Diagnosis present

## 2022-07-10 DIAGNOSIS — Z90710 Acquired absence of both cervix and uterus: Secondary | ICD-10-CM | POA: Diagnosis not present

## 2022-07-10 DIAGNOSIS — Z981 Arthrodesis status: Secondary | ICD-10-CM | POA: Diagnosis not present

## 2022-07-10 DIAGNOSIS — Q210 Ventricular septal defect: Secondary | ICD-10-CM | POA: Diagnosis not present

## 2022-07-10 DIAGNOSIS — S32031D Stable burst fracture of third lumbar vertebra, subsequent encounter for fracture with routine healing: Secondary | ICD-10-CM | POA: Diagnosis not present

## 2022-07-10 DIAGNOSIS — D72829 Elevated white blood cell count, unspecified: Secondary | ICD-10-CM | POA: Diagnosis present

## 2022-07-10 DIAGNOSIS — I10 Essential (primary) hypertension: Secondary | ICD-10-CM | POA: Diagnosis present

## 2022-07-10 DIAGNOSIS — Z96612 Presence of left artificial shoulder joint: Secondary | ICD-10-CM | POA: Diagnosis present

## 2022-07-10 DIAGNOSIS — Z8249 Family history of ischemic heart disease and other diseases of the circulatory system: Secondary | ICD-10-CM | POA: Diagnosis not present

## 2022-07-10 DIAGNOSIS — F17213 Nicotine dependence, cigarettes, with withdrawal: Secondary | ICD-10-CM | POA: Diagnosis present

## 2022-07-10 DIAGNOSIS — Z818 Family history of other mental and behavioral disorders: Secondary | ICD-10-CM | POA: Diagnosis not present

## 2022-07-10 DIAGNOSIS — E119 Type 2 diabetes mellitus without complications: Secondary | ICD-10-CM | POA: Diagnosis present

## 2022-07-10 DIAGNOSIS — F32A Depression, unspecified: Secondary | ICD-10-CM | POA: Diagnosis present

## 2022-07-10 DIAGNOSIS — Z96641 Presence of right artificial hip joint: Secondary | ICD-10-CM | POA: Diagnosis present

## 2022-07-10 DIAGNOSIS — F1111 Opioid abuse, in remission: Secondary | ICD-10-CM | POA: Diagnosis present

## 2022-07-10 DIAGNOSIS — T8131XA Disruption of external operation (surgical) wound, not elsewhere classified, initial encounter: Secondary | ICD-10-CM | POA: Diagnosis present

## 2022-07-10 LAB — BASIC METABOLIC PANEL
Anion gap: 12 (ref 5–15)
BUN: 6 mg/dL — ABNORMAL LOW (ref 8–23)
CO2: 26 mmol/L (ref 22–32)
Calcium: 8.6 mg/dL — ABNORMAL LOW (ref 8.9–10.3)
Chloride: 94 mmol/L — ABNORMAL LOW (ref 98–111)
Creatinine, Ser: 0.4 mg/dL — ABNORMAL LOW (ref 0.44–1.00)
GFR, Estimated: >60 mL/min (ref >60)
Glucose, Bld: 146 mg/dL — ABNORMAL HIGH (ref 70–99)
Potassium: 2.5 mmol/L — CL (ref 3.5–5.1)
Sodium: 132 mmol/L — ABNORMAL LOW (ref 135–145)

## 2022-07-10 LAB — GLUCOSE, CAPILLARY
Glucose-Capillary: 150 mg/dL — ABNORMAL HIGH (ref 70–99)
Glucose-Capillary: 160 mg/dL — ABNORMAL HIGH (ref 70–99)
Glucose-Capillary: 167 mg/dL — ABNORMAL HIGH (ref 70–99)
Glucose-Capillary: 130 mg/dL — ABNORMAL HIGH (ref 70–99)

## 2022-07-10 LAB — HEMOGLOBIN A1C
Hgb A1c MFr Bld: 6 % — ABNORMAL HIGH (ref 4.8–5.6)
Mean Plasma Glucose: 126 mg/dL

## 2022-07-10 MED ORDER — VANCOMYCIN HCL 1250 MG/250ML IV SOLN
1250.0000 mg | INTRAVENOUS | Status: DC
  Administered 2022-07-11 – 2022-07-13 (×3): 1250 mg via INTRAVENOUS
  Filled 2022-07-10 (×3): qty 250

## 2022-07-10 MED ORDER — POTASSIUM CHLORIDE CRYS ER 20 MEQ PO TBCR
40.0000 meq | EXTENDED_RELEASE_TABLET | Freq: Two times a day (BID) | ORAL | Status: DC
  Administered 2022-07-10 – 2022-07-12 (×5): 40 meq via ORAL
  Filled 2022-07-10 (×5): qty 2

## 2022-07-10 MED ORDER — SODIUM CHLORIDE 0.9 % IV SOLN
2.0000 g | INTRAVENOUS | Status: DC
  Administered 2022-07-10 – 2022-07-13 (×4): 2 g via INTRAVENOUS
  Filled 2022-07-10 (×4): qty 20

## 2022-07-10 MED ORDER — POTASSIUM CHLORIDE IN NACL 20-0.9 MEQ/L-% IV SOLN
INTRAVENOUS | Status: AC
  Filled 2022-07-10 (×2): qty 1000

## 2022-07-10 MED ORDER — VANCOMYCIN HCL 1500 MG/300ML IV SOLN
1500.0000 mg | Freq: Once | INTRAVENOUS | Status: AC
  Administered 2022-07-10: 1500 mg via INTRAVENOUS
  Filled 2022-07-10: qty 300

## 2022-07-10 NOTE — Progress Notes (Signed)
   Providing Compassionate, Quality Care - Together   Subjective: Patient reports no new issues.  Objective: Vital signs in last 24 hours: Temp:  [97.5 F (36.4 C)-98.2 F (36.8 C)] 98.2 F (36.8 C) (12/23 0821) Pulse Rate:  [76-88] 84 (12/23 0821) Resp:  [16-19] 16 (12/23 0821) BP: (132-166)/(67-95) 166/78 (12/23 0821) SpO2:  [95 %-99 %] 95 % (12/23 0821)  Intake/Output from previous day: 12/22 0701 - 12/23 0700 In: 566 [P.O.:360; I.V.:6; IV Piggyback:200] Out: 3700 [Urine:3700] Intake/Output this shift: Total I/O In: -  Out: 350 [Urine:350]  Alert and oriented x 4 PERRLA Speech clear, fluent CN II-XII grossly intact MAE, Strength and sensation intact Dressing with moderate amount of serosanguinous drainage Surgical wound with superficial dehiscence superiorly and inferiorly. There is some erythema at the edges of the surgical wound. No purulence or odor   Lab Results: Recent Labs    07/08/22 2221  WBC 14.2*  HGB 11.1*  HCT 31.9*  PLT 434*   BMET Recent Labs    07/09/22 1414 07/10/22 0154  NA 132* 132*  K 2.2* 2.5*  CL 91* 94*  CO2 27 26  GLUCOSE 102* 146*  BUN 7* 6*  CREATININE 0.47 0.40*  CALCIUM 8.5* 8.6*    Studies/Results: No results found.  Assessment/Plan: Patient returned to [facility] from [facility] due to the appearance of her surgical wound. Surgical wound has developed serosanguinous drainage since readmission. Will start IV Vanc and Rocephin. CT lumbar spine ordered to assess for possible CSF leak. Potassium only mildly improved with IV replenishment. Will order PO potassium and recheck BMP tomorrow AM.    LOS: 1 day     Val Eagle, DNP, AGNP-C Nurse Practitioner  [facility] [address], [place], [place] [address] P: [phone]    F: [phone]  07/10/2022, 11:42 AM

## 2022-07-10 NOTE — Progress Notes (Signed)
Pharmacy Antibiotic Note  [name] is a 66 y.o. female admitted on 07/08/2022 with  surgical wound .  Pharmacy has been consulted for Vancomycin dosing. SCr 0.4 at baseline of <1. CrCl 78 ml/min. WBC 14 and afebrile. Antibiotics started for concern of infection. Pharmacy has been consulted to dose Vancomycin.   Plan: Vancomycin 1500 mg IV once on 12/23 Start vancomycin 1250 mg IV q24 hours on 12/24 Continue ceftriaxone 2g IV q24 hours per MD Monitor signs and symptoms of infection Obtain levels as indicated    Temp (24hrs), Avg:97.9 F (36.6 C), Min:97.5 F (36.4 C), Max:98.2 F (36.8 C)  Recent Labs  Lab 07/08/22 2221 07/09/22 1414 07/10/22 0154  WBC 14.2*  --   --   CREATININE  --  0.47 0.40*    CrCl cannot be calculated (Unknown ideal weight.).    No Known Allergies  Antimicrobials this admission: Ceftriaxone  12/23 >>  Vancomycin 12/23 >>   Microbiology results:  Thank you for allowing pharmacy to be a part of this patient's care.  Georga Hacking, Pharm.D PGY1 Pharmacy Resident 07/10/2022 3:00 PM

## 2022-07-11 LAB — GLUCOSE, CAPILLARY
Glucose-Capillary: 137 mg/dL — ABNORMAL HIGH (ref 70–99)
Glucose-Capillary: 111 mg/dL — ABNORMAL HIGH (ref 70–99)
Glucose-Capillary: 160 mg/dL — ABNORMAL HIGH (ref 70–99)
Glucose-Capillary: 125 mg/dL — ABNORMAL HIGH (ref 70–99)

## 2022-07-11 LAB — BASIC METABOLIC PANEL
Anion gap: 13 (ref 5–15)
BUN: 5 mg/dL — ABNORMAL LOW (ref 8–23)
CO2: 22 mmol/L (ref 22–32)
Calcium: 9 mg/dL (ref 8.9–10.3)
Chloride: 98 mmol/L (ref 98–111)
Creatinine, Ser: 0.6 mg/dL (ref 0.44–1.00)
GFR, Estimated: >60 mL/min (ref >60)
Glucose, Bld: 162 mg/dL — ABNORMAL HIGH (ref 70–99)
Potassium: 3 mmol/L — ABNORMAL LOW (ref 3.5–5.1)
Sodium: 133 mmol/L — ABNORMAL LOW (ref 135–145)

## 2022-07-11 MED ORDER — CHLORHEXIDINE GLUCONATE CLOTH 2 % EX PADS
6.0000 | MEDICATED_PAD | Freq: Every day | CUTANEOUS | Status: DC
  Administered 2022-07-12 – 2022-07-16 (×5): 6 via TOPICAL

## 2022-07-11 NOTE — Progress Notes (Signed)
Subjective: Patient reports she has been able to get out of bed to the commode, still has back pain.  Feels some nicotine withdrawal symptoms  Objective: Vital signs in last 24 hours: Temp:  [97.4 F (36.3 C)-98.3 F (36.8 C)] 97.9 F (36.6 C) (12/24 0804) Pulse Rate:  [82-93] 90 (12/24 0804) Resp:  [17-18] 18 (12/24 0804) BP: (141-173)/(86-94) 173/94 (12/24 0804) SpO2:  [99 %] 99 % (12/24 0804)  Intake/Output from previous day: 12/23 0701 - 12/24 0700 In: 1369.8 [P.O.:480; I.V.:789.8; IV Piggyback:100] Out: 3051 [Urine:3050; Stool:1] Intake/Output this shift: No intake/output data recorded.  A+Ox3 Wound with focal area of dehiscence.  No active drainage.  Some induration but no erythema FC x 4  Lab Results: Recent Labs    07/08/22 2221  WBC 14.2*  HGB 11.1*  HCT 31.9*  PLT 434*   BMET Recent Labs    07/10/22 0154 07/11/22 0219  NA 132* 133*  K 2.5* 3.0*  CL 94* 98  CO2 26 22  GLUCOSE 146* 162*  BUN 6* 5*  CREATININE 0.40* 0.60  CALCIUM 8.6* 9.0    Studies/Results: CT LUMBAR SPINE WO CONTRAST  Result Date: 07/10/2022 CLINICAL DATA:  Lumbar radiculopathy.  History of prior surgery EXAM: CT LUMBAR SPINE WITHOUT CONTRAST TECHNIQUE: Multidetector CT imaging of the lumbar spine was performed without intravenous contrast administration. Multiplanar CT image reconstructions were also generated. RADIATION DOSE REDUCTION: This exam was performed according to the departmental dose-optimization program which includes automated exposure control, adjustment of the mA and/or kV according to patient size and/or use of iterative reconstruction technique. COMPARISON:  06/26/2022 FINDINGS: Segmentation: 5 lumbar type vertebrae Alignment: Scoliosis. Vertebrae: Subtle rigid spine fracture at L3 with stable alignment. The spine is ankylosed/fused from T12-S1. Posterior fixation hardware spanning L1 to L5, pedicle screw penetration of the spinal canal as previously noted on the left at  L2, L3, and L4-deepest and most central at L4 where screw tip extends 13 mm into the canal. No new fracture Paraspinal and other soft tissues: Expected postoperative soft tissue swelling and gas. Extensive sigmoid diverticulosis. Disc levels: Prominent degenerative spurring at T11-12, affecting endplates and facets with left more than right foraminal stenosis. IMPRESSION: Recent rigid spine fracture and fixation.  No new abnormality. Electronically Signed   By: Tiburcio Pea M.D.   On: 07/10/2022 12:44    Assessment/Plan: 66 yo F s/p lumbar fusion with small superior wound dehiscence.  She is afebrile. She was started on Vanc, ceftriaxone.  She has mild leukocytosis.  CT L-spine performed yesterday shows no obvious fluid collection evident. - cont BID dressing changes.  No overt infection currently - PT/OT - will consider nicotine patch if withdrawal symptoms continue   [name] 07/11/2022, 10:53 AM

## 2022-07-12 LAB — BASIC METABOLIC PANEL
Anion gap: 16 — ABNORMAL HIGH (ref 5–15)
BUN: 5 mg/dL — ABNORMAL LOW (ref 8–23)
CO2: 18 mmol/L — ABNORMAL LOW (ref 22–32)
Calcium: 9.1 mg/dL (ref 8.9–10.3)
Chloride: 97 mmol/L — ABNORMAL LOW (ref 98–111)
Creatinine, Ser: 0.46 mg/dL (ref 0.44–1.00)
GFR, Estimated: >60 mL/min (ref >60)
Glucose, Bld: 140 mg/dL — ABNORMAL HIGH (ref 70–99)
Potassium: 3.3 mmol/L — ABNORMAL LOW (ref 3.5–5.1)
Sodium: 131 mmol/L — ABNORMAL LOW (ref 135–145)

## 2022-07-12 LAB — GLUCOSE, CAPILLARY
Glucose-Capillary: 173 mg/dL — ABNORMAL HIGH (ref 70–99)
Glucose-Capillary: 155 mg/dL — ABNORMAL HIGH (ref 70–99)
Glucose-Capillary: 92 mg/dL (ref 70–99)

## 2022-07-12 MED ORDER — POTASSIUM CHLORIDE CRYS ER 20 MEQ PO TBCR
20.0000 meq | EXTENDED_RELEASE_TABLET | Freq: Two times a day (BID) | ORAL | Status: DC
  Administered 2022-07-12 – 2022-07-13 (×2): 20 meq via ORAL
  Filled 2022-07-12 (×2): qty 1

## 2022-07-12 NOTE — Progress Notes (Signed)
Patient ID: [name], female   DOB: [birth year], 66 y.o.   MRN: [id number] BP 128/82 (BP Location: Left Arm)   Pulse 91   Temp 97.8 F (36.6 C) (Oral)   Resp 18   SpO2 99%  Alert and oriented x4, speech is clear and fluent Moving all extremities well Dressing intact on thoracic spine Doing well overall

## 2022-07-12 NOTE — Plan of Care (Signed)
  Problem: Education: Goal: Ability to verbalize activity precautions or restrictions will improve Outcome: Progressing Goal: Knowledge of the prescribed therapeutic regimen will improve Outcome: Progressing Goal: Understanding of discharge needs will improve Outcome: Progressing   Problem: Activity: Goal: Ability to avoid complications of mobility impairment will improve Outcome: Progressing Goal: Ability to tolerate increased activity will improve Outcome: Progressing   Problem: Bowel/Gastric: Goal: Gastrointestinal status for postoperative course will improve Outcome: Progressing   Problem: Clinical Measurements: Goal: Ability to maintain clinical measurements within normal limits will improve Outcome: Progressing   Problem: Pain Management: Goal: Pain level will decrease Outcome: Progressing   Problem: Health Behavior/Discharge Planning: Goal: Identification of resources available to assist in meeting health care needs will improve Outcome: Progressing   Problem: Bladder/Genitourinary: Goal: Urinary functional status for postoperative course will improve Outcome: Progressing   Problem: Education: Goal: Knowledge of General Education information will improve Description: Including pain rating scale, medication(s)/side effects and non-pharmacologic comfort measures Outcome: Progressing   Problem: Health Behavior/Discharge Planning: Goal: Ability to manage health-related needs will improve Outcome: Progressing   Problem: Clinical Measurements: Goal: Ability to maintain clinical measurements within normal limits will improve Outcome: Progressing Goal: Will remain free from infection Outcome: Progressing

## 2022-07-13 ENCOUNTER — Encounter (HOSPITAL_COMMUNITY): Payer: Self-pay | Admitting: Neurological Surgery

## 2022-07-13 LAB — BASIC METABOLIC PANEL
Anion gap: 17 — ABNORMAL HIGH (ref 5–15)
BUN: 10 mg/dL (ref 8–23)
CO2: 19 mmol/L — ABNORMAL LOW (ref 22–32)
Calcium: 9.4 mg/dL (ref 8.9–10.3)
Chloride: 92 mmol/L — ABNORMAL LOW (ref 98–111)
Creatinine, Ser: 0.52 mg/dL (ref 0.44–1.00)
GFR, Estimated: >60 mL/min (ref >60)
Glucose, Bld: 125 mg/dL — ABNORMAL HIGH (ref 70–99)
Potassium: 4.3 mmol/L (ref 3.5–5.1)
Sodium: 128 mmol/L — ABNORMAL LOW (ref 135–145)

## 2022-07-13 LAB — GLUCOSE, CAPILLARY
Glucose-Capillary: 132 mg/dL — ABNORMAL HIGH (ref 70–99)
Glucose-Capillary: 116 mg/dL — ABNORMAL HIGH (ref 70–99)
Glucose-Capillary: 125 mg/dL — ABNORMAL HIGH (ref 70–99)
Glucose-Capillary: 181 mg/dL — ABNORMAL HIGH (ref 70–99)

## 2022-07-13 MED ORDER — NICOTINE 7 MG/24HR TD PT24
7.0000 mg | MEDICATED_PATCH | Freq: Every day | TRANSDERMAL | Status: DC
  Administered 2022-07-13 – 2022-07-16 (×4): 7 mg via TRANSDERMAL
  Filled 2022-07-13 (×5): qty 1

## 2022-07-13 MED ORDER — POTASSIUM CHLORIDE CRYS ER 20 MEQ PO TBCR
20.0000 meq | EXTENDED_RELEASE_TABLET | Freq: Every day | ORAL | Status: DC
  Administered 2022-07-14 – 2022-07-15 (×2): 20 meq via ORAL
  Filled 2022-07-13 (×2): qty 1

## 2022-07-13 NOTE — Progress Notes (Signed)
Neurosurgery Service Progress Note  Subjective: No acute events overnight, no radicular pain, back pain improved since I last saw her, feeling anxious    Objective: Vitals:   07/12/22 1929 07/13/22 0502 07/13/22 0722 07/13/22 1533  BP: (!) 146/88 128/69 133/84 (!) 149/87  Pulse: 89 75    Resp: 17 15 19 19   Temp: 98.3 F (36.8 C) 98.4 F (36.9 C) 97.8 F (36.6 C) 97.7 F (36.5 C)  TempSrc: Oral Oral Oral Oral  SpO2: 98% 98% 98% 99%    Physical Exam: Strength 5/5 x4 and SILTx4, incision w/ some small areas of dehiscence but with healthy granulation tissue, no erythema/induration/drainage  Assessment & Plan: 66 y.o. woman s/p 3 column fracture of prior fusion construct s/p complex reconstruction, readmitted for wound concerns.  -wound is not clinically infected, will d/c antibiotics -can start a low dose nicotine patch given her concerns re w/d -PT/OT eval -SW c/s for placement  79  07/13/22 3:56 PM

## 2022-07-13 NOTE — Progress Notes (Signed)
Pharmacy Antibiotic Note  [name] is a 66 y.o. female admitted on 07/08/2022 with  surgical wound dehiscence and wound drainage .  Pharmacy has been consulted for Vancomycin dosing. SCr 0.52 at baseline of <1. CrCl 78 ml/min. WBC 14 (12/21) and afebrile. Antibiotics started for concern of infection.   Plan: Continue vancomycin 1250 mg IV q24 hours Continue ceftriaxone 2g IV q24 hours per MD Monitor signs and symptoms of infection Obtain levels as indicated - will likely need levels in next 24-48 hours if therapy is to continue.    Temp (24hrs), Avg:98.2 F (36.8 C), Min:97.8 F (36.6 C), Max:98.4 F (36.9 C)  Recent Labs  Lab 07/08/22 2221 07/09/22 1414 07/10/22 0154 07/11/22 0219 07/12/22 0232 07/13/22 0320  WBC 14.2*  --   --   --   --   --   CREATININE  --  0.47 0.40* 0.60 0.46 0.52     CrCl cannot be calculated (Unknown ideal weight.).    No Known Allergies  Antimicrobials this admission: Ceftriaxone  12/23 >>  Vancomycin 12/23 >>   Microbiology results:  Thank you for allowing pharmacy to be a part of this patient's care. [employer], Pharm.D., BCPS Clinical Pharmacist  **Pharmacist phone directory can be found on [email or web address] listed under [facility].  07/13/2022 1:08 PM

## 2022-07-13 NOTE — Care Management Important Message (Signed)
Important Message  Patient Details  Name: [name] MRN: [id number] Date of Birth: 1956/05/14   Medicare Important Message Given:  Yes     Dorena Bodo 07/13/2022, 2:45 PM

## 2022-07-14 LAB — BASIC METABOLIC PANEL
Anion gap: 11 (ref 5–15)
BUN: 6 mg/dL — ABNORMAL LOW (ref 8–23)
CO2: 21 mmol/L — ABNORMAL LOW (ref 22–32)
Calcium: 8.7 mg/dL — ABNORMAL LOW (ref 8.9–10.3)
Chloride: 95 mmol/L — ABNORMAL LOW (ref 98–111)
Creatinine, Ser: 0.52 mg/dL (ref 0.44–1.00)
GFR, Estimated: >60 mL/min (ref >60)
Glucose, Bld: 146 mg/dL — ABNORMAL HIGH (ref 70–99)
Potassium: 4 mmol/L (ref 3.5–5.1)
Sodium: 127 mmol/L — ABNORMAL LOW (ref 135–145)

## 2022-07-14 LAB — GLUCOSE, CAPILLARY
Glucose-Capillary: 141 mg/dL — ABNORMAL HIGH (ref 70–99)
Glucose-Capillary: 193 mg/dL — ABNORMAL HIGH (ref 70–99)
Glucose-Capillary: 111 mg/dL — ABNORMAL HIGH (ref 70–99)

## 2022-07-14 MED ORDER — OXYCODONE HCL 5 MG PO TABS
5.0000 mg | ORAL_TABLET | ORAL | Status: DC | PRN
  Administered 2022-07-14 – 2022-07-16 (×9): 5 mg via ORAL
  Filled 2022-07-14 (×9): qty 1

## 2022-07-14 MED ORDER — HEPARIN SODIUM (PORCINE) 5000 UNIT/ML IJ SOLN
5000.0000 [IU] | Freq: Three times a day (TID) | INTRAMUSCULAR | Status: DC
  Administered 2022-07-14 – 2022-07-16 (×5): 5000 [IU] via SUBCUTANEOUS
  Filled 2022-07-14 (×5): qty 1

## 2022-07-14 NOTE — NC FL2 (Signed)
[place] LEVEL OF CARE FORM     IDENTIFICATION  Patient Name: [name] Birthdate: 04-08-1956 Sex: female Admission Date (Current Location): 07/08/2022  [facility] and [place] Number:  Herbalist and Address:  [facility]. [facility], [place] [address], [place], [place] [address]      Provider Number: [id number]  Attending Physician Name and Address:  Judith Part, MD  Relative Name and Phone Number:  Sophya Moral, spouse - [phone]    Current Level of Care: Hospital Recommended Level of Care: [place] Prior Approval Number:    Date Approved/Denied:   PASRR Number: [id number]  Discharge Plan: SNF    Current Diagnoses: Patient Active Problem List   Diagnosis Date Noted   Delayed surgical wound healing 07/08/2022   Fracture of lumbar spine without cord injury ([place]) 06/26/2022   Lumbar vertebral fracture ([place]) 06/25/2022   Mood disorder ([place]) 09/29/2021   Mild cognitive impairment 09/06/2018   Hyponatremia 02/28/2018   S/P shoulder replacement, left 08/19/2016   Anxiety 10/17/2014   Acid reflux 10/17/2014   BP (high blood pressure) 10/17/2014   Arthritis, degenerative 10/17/2014   Adult hypothyroidism 10/17/2014   UTI (urinary tract infection) 06/03/2014   Fracture of bone adjacent to prosthesis 06/03/2014   Diabetes ([place]) 06/02/2014   Peri-prosthetic fracture of femur following total hip arthroplasty 06/02/2014   Bladder retention 05/13/2014   Chronic pain 05/10/2014   History of hip surgery 05/10/2014   UNSPECIFIED HEART FAILURE 06/24/2010   UNSPECIFIED CONGENITAL DEFECT OF SEPTAL CLOSURE 06/24/2010   TOBACCO ABUSE 03/18/2010   Secondary cardiomyopathy ([place]) 03/18/2010   DM 06/13/2009   HYPERTENSION, UNSPECIFIED 06/13/2009   VENTRICULAR TACHYCARDIA 06/13/2009   VENTRICULAR SEPTAL DEFECT, CONGENITAL 06/13/2009   Diabetes mellitus, type 2 ([place]) 06/13/2009   Essential (primary) hypertension  06/13/2009    Orientation RESPIRATION BLADDER Height & Weight     Self, Time, Situation, Place  Normal Indwelling catheter Weight:   Height:     BEHAVIORAL SYMPTOMS/MOOD NEUROLOGICAL BOWEL NUTRITION STATUS      Continent Diet  AMBULATORY STATUS COMMUNICATION OF NEEDS Skin   Total Care Verbally Surgical wounds                       Personal Care Assistance Level of Assistance  Bathing, Feeding, Dressing Bathing Assistance: Limited assistance Feeding assistance: Independent Dressing Assistance: Limited assistance     Functional Limitations Info  Sight, Hearing, Speech Sight Info: Adequate Hearing Info: Adequate Speech Info: Adequate    SPECIAL CARE FACTORS FREQUENCY  PT (By licensed PT), OT (By licensed OT)     PT Frequency: 5 x per week OT Frequency: 5 x per week            Contractures Contractures Info: Not present    Additional Factors Info  Code Status, Allergies, Psychotropic, Insulin Sliding Scale Code Status Info: Full code Allergies Info: NKDA Psychotropic Info: Seroquel, Wellbutrin, Aricept, Trazodone Insulin Sliding Scale Info: Moderate insulin sliding scale - Novolog       Current Medications (07/14/2022):  This is the current hospital active medication list Current Facility-Administered Medications  Medication Dose Route Frequency Provider Last Rate Last Admin   0.9 %  sodium chloride infusion  250 mL Intravenous PRN Viona Gilmore D, NP       acetaminophen (TYLENOL) tablet 650 mg  650 mg Oral Q6H PRN Viona Gilmore D, NP   650 mg at 07/13/22 1923   Or  acetaminophen (TYLENOL) suppository 650 mg  650 mg Rectal Q6H PRN Bergman, Meghan D, NP       amLODipine (NORVASC) tablet 5 mg  5 mg Oral q AM Bergman, Meghan D, NP   5 mg at 07/14/22 0944   bisacodyl (DULCOLAX) EC tablet 5 mg  5 mg Oral Daily PRN Val Eagle D, NP       buPROPion (WELLBUTRIN XL) 24 hr tablet 300 mg  300 mg Oral Daily Bergman, Meghan D, NP   300 mg at 07/14/22 0944    carvedilol (COREG) tablet 6.25 mg  6.25 mg Oral BID WC Bergman, Meghan D, NP   6.25 mg at 07/14/22 0944   Chlorhexidine Gluconate Cloth 2 % PADS 6 each  6 each Topical Daily Jadene Pierini, MD   6 each at 07/14/22 0946   docusate sodium (COLACE) capsule 100 mg  100 mg Oral BID Val Eagle D, NP   100 mg at 07/14/22 0944   donepezil (ARICEPT) tablet 10 mg  10 mg Oral q AM Val Eagle D, NP   10 mg at 07/14/22 0944   fluvoxaMINE (LUVOX) tablet 200 mg  200 mg Oral QHS Bergman, Meghan D, NP   200 mg at 07/13/22 2113   hydrALAZINE (APRESOLINE) injection 5 mg  5 mg Intravenous Q4H PRN Val Eagle D, NP       insulin aspart (novoLOG) injection 0-15 Units  0-15 Units Subcutaneous TID WC Bergman, Meghan D, NP   3 Units at 07/14/22 1226   levothyroxine (SYNTHROID) tablet 88 mcg  88 mcg Oral QAC breakfast Val Eagle D, NP   88 mcg at 07/14/22 0606   losartan (COZAAR) tablet 100 mg  100 mg Oral QHS Bergman, Meghan D, NP   100 mg at 07/13/22 2113   meloxicam (MOBIC) tablet 15 mg  15 mg Oral Daily PRN Val Eagle D, NP   15 mg at 07/11/22 1423   metFORMIN (GLUCOPHAGE) tablet 1,000 mg  1,000 mg Oral BID WC Bergman, Meghan D, NP   1,000 mg at 07/14/22 0944   methocarbamol (ROBAXIN) tablet 750 mg  750 mg Oral Q6H PRN Val Eagle D, NP   750 mg at 07/14/22 1354   nicotine (NICODERM CQ - dosed in mg/24 hr) patch 7 mg  7 mg Transdermal Daily Jadene Pierini, MD   7 mg at 07/14/22 0945   ondansetron (ZOFRAN) tablet 4 mg  4 mg Oral Q6H PRN Val Eagle D, NP   4 mg at 07/10/22 1629   Or   ondansetron (ZOFRAN) injection 4 mg  4 mg Intravenous Q6H PRN Val Eagle D, NP   4 mg at 07/09/22 1856   oxyCODONE (Oxy IR/ROXICODONE) immediate release tablet 10 mg  10 mg Oral Q4H PRN Val Eagle D, NP   10 mg at 07/14/22 1116   polyethylene glycol (MIRALAX / GLYCOLAX) packet 17 g  17 g Oral Daily PRN Val Eagle D, NP       potassium chloride SA (KLOR-CON M) CR tablet 20 mEq  20 mEq  Oral Daily Bergman, Meghan D, NP   20 mEq at 07/14/22 0944   QUEtiapine (SEROQUEL) tablet 600 mg  600 mg Oral QHS Bergman, Meghan D, NP   600 mg at 07/13/22 2113   rosuvastatin (CRESTOR) tablet 20 mg  20 mg Oral QHS Bergman, Meghan D, NP   20 mg at 07/13/22 2113   sodium chloride flush (NS) 0.9 % injection 3 mL  3 mL Intravenous Q12H Bergman,  Meghan D, NP   3 mL at 07/14/22 0945   sodium chloride flush (NS) 0.9 % injection 3 mL  3 mL Intravenous PRN Bergman, Meghan D, NP       sodium phosphate (FLEET) 7-19 GM/118ML enema 1 enema  1 enema Rectal Once PRN Viona Gilmore D, NP       traZODone (DESYREL) tablet 100 mg  100 mg Oral QHS Bergman, Meghan D, NP   100 mg at 07/13/22 2113     Discharge Medications: Please see discharge summary for a list of discharge medications.  Relevant Imaging Results:  Relevant Lab Results:   Additional Information SS# [id number]  Curlene Labrum, RN

## 2022-07-14 NOTE — TOC Progression Note (Signed)
Transition of Care [facility]) - Progression Note    Patient Details  Name: [name] MRN: [id number] Date of Birth: 1955-08-25  Transition of Care [facility]) CM/SW [place], RN Phone Number: 07/14/2022, 2:11 PM  Clinical Narrative:    CM met with the patient at the bedside to discuss patient's return to [facility].  [facility] is requesting photo images of patient's back be placed in the Media notes.  Charge nurse obtained photo and placed in EPIC.  I called [facility] and updated them that photos are available. The facility is having DON review and will start insurance authorization once patient has been accepted to return back to the facility for care.  The patient has PIV at this time and foley to straight gravity drainage.  CM will continue to follow the patient for SNF placement.        Expected Discharge Plan and Services                                               Social Determinants of Health (SDOH) Interventions SDOH Screenings   Food Insecurity: No Food Insecurity (06/27/2022)  Housing: Low Risk  (06/27/2022)  Transportation Needs: No Transportation Needs (06/27/2022)  Utilities: Not At Risk (06/27/2022)  Tobacco Use: High Risk (07/13/2022)    Readmission Risk Interventions     No data to display

## 2022-07-14 NOTE — Progress Notes (Signed)
Neurosurgery Service Progress Note  Subjective: No acute events overnight, no radicular pain, no issues with the wound, no new complaints today  Objective: Vitals:   07/13/22 1533 07/13/22 2141 07/14/22 0733 07/14/22 1538  BP: (!) 149/87 (!) 144/90 (!) 140/97 (!) 145/85  Pulse:  84 100 94  Resp: 19 18 18 18   Temp: 97.7 F (36.5 C) 97.8 F (36.6 C) (!) 97.5 F (36.4 C) 97.9 F (36.6 C)  TempSrc: Oral Oral Oral Oral  SpO2: 99% 99% 97% 99%    Physical Exam: Strength 5/5 x4 and SILTx4, incision w/ some small areas of dehiscence but with healthy granulation tissue, no erythema/induration/drainage  Assessment & Plan: 66 y.o. woman s/p 3 column fracture of prior fusion construct s/p complex reconstruction, readmitted for wound concerns.  -decrease oxy, continue wean with goal towards off -PT/OT eval'd SNF, placement pending -SCDs/TEDs, SQH  79  07/14/22 8:12 PM

## 2022-07-14 NOTE — Progress Notes (Signed)
Mobility Specialist Progress Note:   07/14/22 0944  Mobility  Activity Dangled on edge of bed (3x)  Level of Assistance Moderate assist, patient does 50-74%  Assistive Device Other (Comment) (Physical Assist)  Activity Response Tolerated fair  Mobility Referral Yes  $Mobility charge 1 Mobility   Pt received in bed and agreeable. C/o discomfort throughout. Pt required ModA with moving RLE in bed, ModA to sit and stay upright on EOB. Fatigued after ~30 seconds of sitting upright, requiring supine breaks between sitting attempts. Pt left in bed with all needs met and call bell in reach.   Andrey Campanile Mobility Specialist Please contact via SecureChat or  Rehab office at [phone]

## 2022-07-14 NOTE — Progress Notes (Signed)
Occupational Therapy Treatment Patient Details Name: [name] MRN: [id number] DOB: 05-Mar-1956 Today's Date: 07/14/2022   History of present illness 66 y.o. female admitted 12/9 with fall, back pain x 2 wks and L3 burst fx s/p stabilization 12/9. Pt with wound dehiscence but D/C to [facility] place 12/21. Facility would not admit due to wound drainage and pt returned to ED. PMhx: DM, HTN, anxiety, HTN, arthritis, Lt TSA   OT comments  Pt slowly progressing towards established OT goals. Agreeable to OT session on arrival. Pt performing bed mobility with mod A, and unable to recall name of log roll technique, but able to describe vaguely after OT asking if it was familiar. Pt benefits from encouragement during session to identify what she CAN do. Performing STS transfer with mod A +2. Pt with pain during session limiting activity tolerance and resulting in request to return to supine. Pt maintaining precautions well during session with continued education from OT. Continue to recommend SNF for continued OT services.    Recommendations for follow up therapy are one component of a multi-disciplinary discharge planning process, led by the attending physician.  Recommendations may be updated based on patient status, additional functional criteria and insurance authorization.    Follow Up Recommendations  Skilled nursing-short term rehab (<3 hours/day)     Assistance Recommended at Discharge Frequent or constant Supervision/Assistance  Patient can return home with the following  Two people to help with walking and/or transfers;A lot of help with bathing/dressing/bathroom;Assist for transportation;Help with stairs or ramp for entrance   Equipment Recommendations  Other (comment) (defer to next venue)    Recommendations for Other Services      Precautions / Restrictions Precautions Precautions: Back;Fall Precaution Booklet Issued: No Precaution Comments: no brace Restrictions Weight  Bearing Restrictions: No       Mobility Bed Mobility Overal bed mobility: Needs Assistance Bed Mobility: Rolling, Sidelying to Sit, Sit to Sidelying Rolling: Mod assist Sidelying to sit: Mod assist, +2 for safety/equipment     Sit to sidelying: Mod assist, +2 for safety/equipment General bed mobility comments: Pt able to roll and reach for grab bar. Assist for BLE off side of bed. Assist to advance BLE onto bed and guard trunk/head to return to supine    Transfers Overall transfer level: Needs assistance Equipment used: Rolling walker (2 wheels) Transfers: Sit to/from Stand Sit to Stand: Mod assist, +2 physical assistance           General transfer comment: STS with mod +2 and mod cueing for hand placement/safety. Pt deferring further mobility this session.     Balance Overall balance assessment: Needs assistance Sitting-balance support: Feet supported, Bilateral upper extremity supported Sitting balance-Leahy Scale: Poor Sitting balance - Comments: posterior lean   Standing balance support: Bilateral upper extremity supported, During functional activity Standing balance-Leahy Scale: Poor Standing balance comment: bil UE support and sacral support of pad to stand                           ADL either performed or assessed with clinical judgement   ADL Overall ADL's : Needs assistance/impaired     Grooming: Wash/dry face;Set up;Bed level               Lower Body Dressing: Total assistance;Sitting/lateral leans Lower Body Dressing Details (indicate cue type and reason): Mobility specialist donning socks for pt this session Toilet Transfer: Moderate assistance;+2 for physical assistance;+2 for safety/equipment;Rolling walker (2 wheels) Toilet  Transfer Details (indicate cue type and reason): STS         Functional mobility during ADLs: Moderate assistance;+2 for physical assistance;+2 for safety/equipment;Rolling walker (2 wheels) (STS) General ADL  Comments: Initially min-mod A for sitting balance and progressing to min guard with uintermittent min A with BUE for support and unable to maintain balance without BUE.    Extremity/Trunk Assessment Upper Extremity Assessment Upper Extremity Assessment: Generalized weakness   Lower Extremity Assessment Lower Extremity Assessment: Generalized weakness        Vision       Perception     Praxis      Cognition Arousal/Alertness: Awake/alert Behavior During Therapy: Flat affect Overall Cognitive Status: Impaired/Different from baseline Area of Impairment: Following commands, Safety/judgement, Awareness, Problem solving                     Memory: Decreased short-term memory, Decreased recall of precautions Following Commands: Follows one step commands inconsistently, Follows one step commands with increased time Safety/Judgement: Decreased awareness of deficits, Decreased awareness of safety Awareness: Intellectual Problem Solving: Slow processing, Requires verbal cues, Requires tactile cues, Decreased initiation General Comments: pt very focues on pain and anticipation of pain, lacks awareness of safety with heavy posterior lean with transition to sitting        Exercises      Shoulder Instructions       General Comments      Pertinent Vitals/ Pain       Pain Assessment Pain Assessment: Faces Faces Pain Scale: Hurts whole lot Pain Location: back Pain Descriptors / Indicators: Aching, Grimacing Pain Intervention(s): Limited activity within patient's tolerance, Monitored during session, Repositioned  Home Living                                          Prior Functioning/Environment              Frequency  Min 2X/week        Progress Toward Goals  OT Goals(current goals can now be found in the care plan section)  Progress towards OT goals: Progressing toward goals  Acute Rehab OT Goals Patient Stated Goal: pain  controlled OT Goal Formulation: With patient Time For Goal Achievement: 07/23/22 Potential to Achieve Goals: Good ADL Goals Pt Will Perform Grooming: with min guard assist;sitting Pt Will Perform Lower Body Bathing: with mod assist;sit to/from stand;sitting/lateral leans;with adaptive equipment Pt Will Perform Upper Body Dressing: with min guard assist;sitting Pt Will Perform Lower Body Dressing: sit to/from stand;with mod assist Pt Will Transfer to Toilet: with min assist;stand pivot transfer;bedside commode  Plan Discharge plan remains appropriate;Frequency remains appropriate    Co-evaluation                 AM-PAC OT "6 Clicks" Daily Activity     Outcome Measure   Help from another person eating meals?: None Help from another person taking care of personal grooming?: A Little Help from another person toileting, which includes using toliet, bedpan, or urinal?: Total Help from another person bathing (including washing, rinsing, drying)?: A Lot Help from another person to put on and taking off regular upper body clothing?: A Lot Help from another person to put on and taking off regular lower body clothing?: Total 6 Click Score: 13    End of Session Equipment Utilized During Treatment: Gait belt;Rolling walker (2 wheels)  OT  Visit Diagnosis: Unsteadiness on feet (R26.81);Other abnormalities of gait and mobility (R26.89);Pain Pain - part of body:  (back)   Activity Tolerance Patient limited by pain;Patient limited by fatigue   Patient Left in bed;with call bell/phone within reach;with bed alarm set   Nurse Communication Mobility status;Patient requests pain meds        Time: 6861-6837 OT Time Calculation (min): 17 min  Charges: OT General Charges $OT Visit: 1 Visit OT Treatments $Therapeutic Activity: 8-22 mins [name], [name] [facility] Acute Rehabilitation Office: [phone]    [name] 07/14/2022, 2:28 PM

## 2022-07-14 NOTE — Progress Notes (Signed)
Attempted to change dressing on patient's back with morning meds. Patient states she wants to wait till dayshift when she gets up to have her dressing changed. Will pass along to day shift RN.

## 2022-07-15 LAB — BASIC METABOLIC PANEL
Anion gap: 15 (ref 5–15)
BUN: 5 mg/dL — ABNORMAL LOW (ref 8–23)
CO2: 19 mmol/L — ABNORMAL LOW (ref 22–32)
Calcium: 8.7 mg/dL — ABNORMAL LOW (ref 8.9–10.3)
Chloride: 92 mmol/L — ABNORMAL LOW (ref 98–111)
Creatinine, Ser: 0.43 mg/dL — ABNORMAL LOW (ref 0.44–1.00)
GFR, Estimated: >60 mL/min (ref >60)
Glucose, Bld: 167 mg/dL — ABNORMAL HIGH (ref 70–99)
Potassium: 3.8 mmol/L (ref 3.5–5.1)
Sodium: 126 mmol/L — ABNORMAL LOW (ref 135–145)

## 2022-07-15 LAB — GLUCOSE, CAPILLARY
Glucose-Capillary: 119 mg/dL — ABNORMAL HIGH (ref 70–99)
Glucose-Capillary: 120 mg/dL — ABNORMAL HIGH (ref 70–99)
Glucose-Capillary: 148 mg/dL — ABNORMAL HIGH (ref 70–99)
Glucose-Capillary: 150 mg/dL — ABNORMAL HIGH (ref 70–99)
Glucose-Capillary: 173 mg/dL — ABNORMAL HIGH (ref 70–99)

## 2022-07-15 MED ORDER — POTASSIUM CHLORIDE CRYS ER 20 MEQ PO TBCR
20.0000 meq | EXTENDED_RELEASE_TABLET | Freq: Two times a day (BID) | ORAL | Status: DC
  Administered 2022-07-15 – 2022-07-16 (×2): 20 meq via ORAL
  Filled 2022-07-15 (×2): qty 1

## 2022-07-15 NOTE — Progress Notes (Signed)
Physical Therapy Treatment Patient Details Name: [name] MRN: [id number] DOB: 12/14/1955 Today's Date: 07/15/2022   History of Present Illness 66 y.o. female admitted 12/9 with fall, back pain x 2 wks and L3 burst fx s/p stabilization 12/9. Pt with wound dehiscence but D/C to [facility] 12/21. Facility would not admit due to wound drainage and pt returned to ED. PMhx: DM, HTN, anxiety, HTN, arthritis, Lt TSA    PT Comments    Pt reporting restlessness upon PT arrival to room, motivated to mobilize OOB. PT focused session on seated balance and transfers, opted for squat pivot given pt significant back pain presentation and to hopefully improve pt independence with transfers. Pt requiring mod assist +1 for transfer level mobility at this time, limited in mobility progression today due to pain. Pt remains a good ST-SNF candidate. Will continue to follow .    Recommendations for follow up therapy are one component of a multi-disciplinary discharge planning process, led by the attending physician.  Recommendations may be updated based on patient status, additional functional criteria and insurance authorization.  Follow Up Recommendations  Skilled nursing-short term rehab (<3 hours/day)     Assistance Recommended at Discharge Frequent or constant Supervision/Assistance  Patient can return home with the following A lot of help with bathing/dressing/bathroom;Assistance with cooking/housework;Assist for transportation;Two people to help with walking and/or transfers;Direct supervision/assist for medications management;Help with stairs or ramp for entrance   Equipment Recommendations  Rolling walker (2 wheels);BSC/3in1;Other (comment) (defer to next venue)    Recommendations for Other Services       Precautions / Restrictions Precautions Precautions: Back;Fall Precaution Booklet Issued: No Precaution Comments: no brace Restrictions Weight Bearing Restrictions: No      Mobility  Bed Mobility Overal bed mobility: Needs Assistance Bed Mobility: Rolling, Sidelying to Sit Rolling: Min assist Sidelying to sit: Mod assist       General bed mobility comments: assist for trunk elevation, LE lowering over EOB, and scooting to EOB    Transfers Overall transfer level: Needs assistance   Transfers: Bed to chair/wheelchair/BSC       Squat pivot transfers: Mod assist     General transfer comment: mod assist for squat pivot to recliner for bodyweight support, LE placement during scooting.    Ambulation/Gait                   Stairs             Wheelchair Mobility    Modified Rankin (Stroke Patients Only)       Balance Overall balance assessment: Needs assistance Sitting-balance support: Feet supported, Bilateral upper extremity supported Sitting balance-Leahy Scale: Fair Sitting balance - Comments: fair to poor, periods of supervision level of assist but occasionally needs posterior truncal support. EOB sitting x5 minute tolerance Postural control: Posterior lean Standing balance support: Bilateral upper extremity supported, During functional activity Standing balance-Leahy Scale: Poor                              Cognition Arousal/Alertness: Awake/alert Behavior During Therapy: Flat affect Overall Cognitive Status: Impaired/Different from baseline Area of Impairment: Following commands, Safety/judgement, Awareness, Problem solving                     Memory: Decreased recall of precautions Following Commands: Follows one step commands with increased time Safety/Judgement: Decreased awareness of deficits, Decreased awareness of safety Awareness: Intellectual Problem Solving: Requires verbal cues,  Requires tactile cues, Decreased initiation General Comments: multimodal cuing for safe mobility        Exercises      General Comments        Pertinent Vitals/Pain Pain Assessment Pain  Assessment: Faces Faces Pain Scale: Hurts even more Pain Location: back Pain Descriptors / Indicators: Aching, Grimacing Pain Intervention(s): Monitored during session, Limited activity within patient's tolerance, Repositioned    Home Living                          Prior Function            PT Goals (current goals can now be found in the care plan section) Acute Rehab PT Goals Patient Stated Goal: be able to get up PT Goal Formulation: With patient/family Time For Goal Achievement: 07/23/22 Potential to Achieve Goals: Fair Progress towards PT goals: Progressing toward goals    Frequency    Min 3X/week      PT Plan Current plan remains appropriate    Co-evaluation              AM-PAC PT "6 Clicks" Mobility   Outcome Measure  Help needed turning from your back to your side while in a flat bed without using bedrails?: A Little Help needed moving from lying on your back to sitting on the side of a flat bed without using bedrails?: A Little Help needed moving to and from a bed to a chair (including a wheelchair)?: A Lot Help needed standing up from a chair using your arms ([name].g., wheelchair or bedside chair)?: A Lot Help needed to walk in hospital room?: Total Help needed climbing 3-5 steps with a railing? : Total 6 Click Score: 12    End of Session   Activity Tolerance: Patient limited by pain Patient left: in chair;with call bell/phone within reach;with chair alarm set Nurse Communication: Mobility status;Other (comment) (NT notified pt requesting bath and linen change) PT Visit Diagnosis: Unsteadiness on feet (R26.81);Muscle weakness (generalized) (M62.81);Pain     Time: 0906-0920 PT Time Calculation (min) (ACUTE ONLY): 14 min  Charges:  $Therapeutic Activity: 8-22 mins                     [name], PT DPT Acute Rehabilitation Services Pager [phone]  Office [phone]    [name] [name] [name] 07/15/2022, 9:55 AM

## 2022-07-15 NOTE — TOC Progression Note (Addendum)
Transition of Care [facility]) - Progression Note    Patient Details  Name: [name] MRN: [id number] Date of Birth: 1955-09-24  Transition of Care [facility]) CM/SW Contact  Janae Bridgeman, RN Phone Number: 07/15/2022, 10:55 AM  Clinical Narrative:    Cm called and spoke with admissions at [facility] and the facility has an available admission bed for the patient once insurance authorization is obtained.  I started insurance authorization this morning through [facility] and updated clinicals.  CM will continue to follow the patient for SNF placement at return to [facility] once insurance authorization approval has been obtained.  07/15/2022 1350 - CM received insurance authorization for patient at [facility].  The facility will have an admission bed ready for tomorrow.  Dr. Maurice Small, Neurosurgeon was informed and discharge summary and orders were requested for patient's return to the facility tomorrow.  PTAR will need to be set up for discharge - pending 07/16/2022   Expected Discharge Plan: Skilled Nursing Facility Barriers to Discharge: Continued Medical Work up  Expected Discharge Plan and Services   Discharge Planning Services: CM Consult Post Acute Care Choice: Skilled Nursing Facility Living arrangements for the past 2 months: Single Family Home                                       Social Determinants of Health (SDOH) Interventions SDOH Screenings   Food Insecurity: No Food Insecurity (06/27/2022)  Housing: Low Risk  (06/27/2022)  Transportation Needs: No Transportation Needs (06/27/2022)  Utilities: Not At Risk (06/27/2022)  Tobacco Use: High Risk (07/13/2022)    Readmission Risk Interventions    07/14/2022    2:53 PM  Readmission Risk Prevention Plan  Transportation Screening Complete  PCP or Specialist Appt within 5-7 Days Complete  Home Care Screening Complete  Medication Review (RN CM) Complete

## 2022-07-15 NOTE — Progress Notes (Signed)
Neurosurgery Service Progress Note  Subjective: No acute events overnight, no radicular pain, no issues with the wound, no new complaints today, subjectively improved today - mood is better, depressive Sx improved  Objective: Vitals:   07/14/22 2118 07/15/22 0525 07/15/22 0824 07/15/22 0825  BP: (!) 146/92 (!) 147/88 (!) 144/96 (!) 144/96  Pulse: 88 96 97 96  Resp: 17 17    Temp: 98.3 F (36.8 C) 98.1 F (36.7 C) 98.2 F (36.8 C) 98.2 F (36.8 C)  TempSrc: Oral  Oral Oral  SpO2: 99% 98% 99% 99%    Physical Exam: Strength 5/5 x4 and SILTx4, incision w/ some small areas of dehiscence but with healthy granulation tissue, no erythema/induration/drainage  Assessment & Plan: 66 y.o. woman s/p 3 column fracture of prior fusion construct s/p complex reconstruction, readmitted for wound concerns.  -doing well on decreased oxy, plan to wean off this week -PT/OT eval'd SNF, placement pending -SCDs/TEDs, SQH  [name]  07/15/22 1:25 PM

## 2022-07-15 NOTE — Progress Notes (Signed)
Patients sodium is trending down currently 126. Sent [place] a epic message. No response at this time.

## 2022-07-16 LAB — GLUCOSE, CAPILLARY: Glucose-Capillary: 139 mg/dL — ABNORMAL HIGH (ref 70–99)

## 2022-07-16 MED ORDER — OXYCODONE HCL 5 MG PO TABS: 5.0000 mg | ORAL_TABLET | ORAL | 0 refills | Status: DC | PRN

## 2022-07-16 NOTE — TOC Progression Note (Signed)
Transition of Care [facility]) - Progression Note    Patient Details  Name: [name] MRN: [id number] Date of Birth: 01-Nov-1955  Transition of Care [facility]) CM/SW Contact  Janae Bridgeman, RN Phone Number: 07/16/2022, 12:26 PM  Clinical Narrative:    CM spoke with Dr. Maurice Small and patient is medically stable to discharge to [facility] this morning.  I called and spoke with Star, admissions CM at the facility and provided her with the patient's insurance authorization approval for placement - [employer] health information [facility] Auth ID [id number], next review date is 07/20/2022.  PTAR was called for transport to the facility.  Discharge summary and transfer orders sent in the hub for the facility.  Bedside nursing - please call report to [facility] SNF - [phone].  The patient and family are aware that patient will be transferred to the facility in the next 1-2 hours.   Expected Discharge Plan: Skilled Nursing Facility Barriers to Discharge: Continued Medical Work up  Expected Discharge Plan and Services   Discharge Planning Services: CM Consult Post Acute Care Choice: Skilled Nursing Facility Living arrangements for the past 2 months: Single Family Home Expected Discharge Date: 07/16/22                                     Social Determinants of Health (SDOH) Interventions SDOH Screenings   Food Insecurity: No Food Insecurity (06/27/2022)  Housing: Low Risk  (06/27/2022)  Transportation Needs: No Transportation Needs (06/27/2022)  Utilities: Not At Risk (06/27/2022)  Tobacco Use: High Risk (07/13/2022)    Readmission Risk Interventions    07/14/2022    2:53 PM  Readmission Risk Prevention Plan  Transportation Screening Complete  PCP or Specialist Appt within 5-7 Days Complete  Home Care Screening Complete  Medication Review (RN CM) Complete

## 2022-07-16 NOTE — Progress Notes (Signed)
Neurosurgery Service Progress Note  Subjective: No acute events overnight, feels again better today, was able to get up to the commode and have a BM, energy level improving  Objective: Vitals:   07/15/22 1542 07/15/22 2040 07/16/22 0516 07/16/22 0948  BP: (!) 152/98 (!) 156/92 (!) 150/89 (!) 155/92  Pulse: 95 86 91 93  Resp:  17 17 16   Temp: 97.9 F (36.6 C) 97.6 F (36.4 C) 98.2 F (36.8 C) 97.7 F (36.5 C)  TempSrc: Oral Oral  Oral  SpO2: 98% 98% 98% 99%    Physical Exam: Strength 5/5 x4 and SILTx4, incision w/ some small areas of dehiscence but with healthy granulation tissue, no erythema/induration/drainage  Assessment & Plan: 66 y.o. woman s/p 3 column fracture of prior fusion construct s/p complex reconstruction, readmitted for wound concerns.  -doing well on decreased oxy, improvement with each step down, will plan on discontinuing in 5 days (7 days from last step down), which we discussed today, transition to NSAID while at SNF -Na has been downtrending, has has a h/o hyponatremia with prior values in the high 120s, currently asymptomatic, will request an RFP in 1 week  -PT/OT eval'd SNF -SCDs/TEDs, SQH -d/c to SNF today  79  07/16/22 10:37 AM

## 2022-07-16 NOTE — Discharge Summary (Signed)
Discharge Summary  Date of Admission: 07/08/2022  Date of Discharge: 07/16/22  Attending Physician: Autumn Patty, MD  Hospital Course: Patient was admitted from SNF due to concern for wound dehiscence. She was transferred back, wound care eval'd and recommended twice daily dressing changes. She was briefly started on antibiotics due to reports of concern for wound infection, but these were stopped after evaluating the wound and finding no signs or symptoms of infection. Her superficial dehiscence did well and continued to improve each day with healthy granulation tissue. PT/OT recommended SNF. Her opiates were weaned down with a plan to discontinue them on 1/3, given her history of prior issues with opiate usage. Her sodium trended down but historically she has had Na values in the high 120s. She was asymptomatic so an RFP will be repeated in 1 week to confirm it is in her normal range. Her hospital course was uncomplicated and the patient was discharged to SNF on 05/16/22. She will follow up in clinic with me in 2 weeks.  Neurologic exam at discharge:  Strength 5/5 x4, SILTx4 Incision with small areas of superficial dehiscence with healthy granulation tissue, no erythema or induration  Discharge Diagnosis: Post-operative wound dehiscence  Diet: Resume regular diet  Discharge Medications: Allergies as of 07/16/2022   No Known Allergies      Medication List     TAKE these medications    amLODipine 5 MG tablet Commonly known as: NORVASC Take 5 mg by mouth in the morning.   buPROPion 300 MG 24 hr tablet Commonly known as: WELLBUTRIN XL Take 300 mg by mouth daily.   carvedilol 6.25 MG tablet Commonly known as: COREG Take 6.25 mg by mouth in the morning and at bedtime.   donepezil 10 MG tablet Commonly known as: ARICEPT Take 10 mg by mouth in the morning.   fluvoxaMINE 100 MG tablet Commonly known as: LUVOX Take 200 mg by mouth at bedtime.   ibuprofen 200 MG  tablet Commonly known as: ADVIL Take 400 mg by mouth See admin instructions. Take 400 mg by mouth every four hours, rotating with 1,000 mg Tylenol   lamoTRIgine 100 MG tablet Commonly known as: LAMICTAL Take 200 mg by mouth in the morning.   levothyroxine 88 MCG tablet Commonly known as: SYNTHROID Take 88 mcg by mouth daily before breakfast.   loperamide 2 MG capsule Commonly known as: IMODIUM Take 4 mg by mouth in the morning.   losartan 100 MG tablet Commonly known as: COZAAR Take 100 mg by mouth at bedtime.   meloxicam 15 MG tablet Commonly known as: MOBIC Take 15 mg by mouth daily as needed for pain.   metFORMIN 500 MG tablet Commonly known as: GLUCOPHAGE Take 1,000 mg by mouth 2 (two) times daily with a meal.   methocarbamol 750 MG tablet Commonly known as: Robaxin-750 Take 1 tablet (750 mg total) by mouth every 8 (eight) hours as needed for muscle spasms.   multivitamin tablet Take 1 tablet by mouth daily.   naproxen 375 MG tablet Commonly known as: NAPROSYN Take 1 tablet (375 mg total) by mouth 2 (two) times daily with a meal.   ondansetron 4 MG tablet Commonly known as: ZOFRAN Take 1 tablet (4 mg total) by mouth every 6 (six) hours as needed for nausea or vomiting.   oxyCODONE 5 MG immediate release tablet Commonly known as: Oxy IR/ROXICODONE Take 1 tablet (5 mg total) by mouth every 4 (four) hours as needed (pain). What changed:  medication strength how  much to take reasons to take this   Ozempic (1 MG/DOSE) 4 MG/3ML Sopn Generic drug: Semaglutide (1 MG/DOSE) Inject 2 mg into the skin every Monday.   pantoprazole 40 MG tablet Commonly known as: PROTONIX Take 80 mg by mouth at bedtime.   QUEtiapine 300 MG tablet Commonly known as: SEROQUEL Take 600 mg by mouth at bedtime.   rosuvastatin 20 MG tablet Commonly known as: CRESTOR Take 20 mg by mouth at bedtime.   traZODone 50 MG tablet Commonly known as: DESYREL Take 100 mg by mouth at  bedtime.   TYLENOL 500 MG tablet Generic drug: acetaminophen Take 1,000 mg by mouth See admin instructions. Take 1,000 mg by mouth every four hours, rotating with 400 mg Ibuprofen               Discharge Care Instructions  (From admission, onward)           Start     Ordered   07/16/22 0000  Discharge wound care:       Comments: Discharge Instructions  No restriction in activities, slowly increase your activity back to normal.   Continue twice daily dressing changes until wound is healed. Wound instructions in discharge packet - cleanse with normal saline, apply thin strip of Aquacel and place overlying dressing bid.   Okay to shower on the day of discharge. Use regular soap and water and try to be gentle when cleaning your incision.   Follow up with Dr. Zada Finders in 2 weeks after discharge. If you do not already have a discharge appointment, please call his office at [phone] to schedule a follow up appointment. If you have any concerns or questions, please call the office and let us know.  Discontinue oxycodone on 07/21/21. Repeat renal function panel while at SNF on 07/21/21   07/16/22 [place], MD 07/16/22 10:41 AM

## 2022-08-05 ENCOUNTER — Other Ambulatory Visit: Payer: Self-pay | Admitting: Neurological Surgery

## 2022-08-06 ENCOUNTER — Encounter (HOSPITAL_COMMUNITY): Payer: Self-pay | Admitting: Neurological Surgery

## 2022-08-06 ENCOUNTER — Other Ambulatory Visit (HOSPITAL_COMMUNITY): Payer: Self-pay | Admitting: Neurological Surgery

## 2022-08-06 ENCOUNTER — Ambulatory Visit (INDEPENDENT_AMBULATORY_CARE_PROVIDER_SITE_OTHER): Payer: Medicare Other

## 2022-08-06 DIAGNOSIS — S32001D Stable burst fracture of unspecified lumbar vertebra, subsequent encounter for fracture with routine healing: Secondary | ICD-10-CM

## 2022-08-06 NOTE — OR Nursing (Addendum)
Patient is currently in rehab at [facility].  Jessica's phone [phone] and fax number [phone]. Faxed instructions for DOS to Janett Billow at [facility] and verbally reviewed the instructions with Jessica/DON via speaker phone.  [place] will bring patient to hospital via [employer]. on DOS.     PCP - Dr Herbie [place] Reads Cardiologist - Dr Candee Furbish Neurology - Dr. Leta Baptist   Chest x-ray - n/a EKG - 06/25/22 Stress Test - 07/06/14 ECHO - 02/10/22 Cardiac Cath - 06/23/15  ICD Pacemaker/Loop - n/a  Sleep Study -  n/a CPAP - none  Do not take Metformin or Ozempic on the morning of surgery.  (Patient usually takes Ozempic on Mondays.)  If your blood sugar is less than 70 mg/dL, you will need to treat for low blood sugar: Treat a low blood sugar (less than 70 mg/dL) with  cup of clear juice (cranberry or apple),  Recheck blood sugar in 15 minutes after treatment (to make sure it is greater than 70 mg/dL). If your blood sugar is not greater than 70 mg/dL on recheck, call [phone] for further instructions.  ERAS: Clear liquids til 10:15 AM DOS.  Anesthesia review: Yes  STOP now taking any Aspirin (unless otherwise instructed by your surgeon), Aleve, Naproxen, Ibuprofen, Motrin, Advil, Goody's, BC's, all herbal medications, fish oil, and all vitamins.   Coronavirus Screening Does the patient have any of the following symptoms:  Cough yes/no: No Fever (>100.58F)  yes/no: No Runny nose yes/no: No Sore throat yes/no: No Difficulty breathing/shortness of breath  yes/no: No  Has the patient traveled in the last 14 days and where? yes/no: No

## 2022-08-06 NOTE — Progress Notes (Signed)
TOJanett Billow, phone [phone] FAX:  [phone]   Surgical Instructions for [name]    Your procedure is scheduled on Monday, 08/09/22.  Report to [facility] Main Entrance "A" at 10:45 A.M., then check in with the Admitting office.  Call this number if you have problems the morning of surgery:  [phone]   If you have any questions prior to your surgery date call [phone]: Open Monday-Friday 8am-4pm If you experience any cold or flu symptoms such as cough, fever, chills, shortness of breath, etc. between now and your scheduled surgery, please notify us at the above number     Remember:  Do not eat after midnight the night before your surgery-Sunday.    You may drink clear liquids until 10:15 AM the morning of your surgery-Monday.    Clear liquids allowed are: Water, Non-Citrus Juices (without pulp), Carbonated Beverages, Clear Tea, Black Coffee ONLY (NO MILK, CREAM OR POWDERED CREAMER of any kind), and Gatorade    Take these medicines the morning of surgery with A SIP OF WATER:  acetaminophen (TYLENOL) if needed amLODipine (NORVASC) buPROPion (WELLBUTRIN XL) carvedilol (COREG) 6.25 MG  cyclobenzaprine (FLEXERIL) donepezil (ARICEPT)  gabapentin (NEURONTIN)  lamoTRIgine (LAMICTAL)  levothyroxine (SYNTHROID)  ondansetron (ZOFRAN) if needed traMADol (ULTRAM) if needed  Do not take Metformin or Ozempic on the day of surgery, Monday, 08/09/22.   As of today, STOP taking any Aspirin (unless otherwise instructed by your surgeon) Aleve, Naproxen, Ibuprofen, Motrin, Advil, Goody's, BC's, all herbal medications, fish oil, and all vitamins.           Do not wear jewelry or makeup. Do not wear lotions, powders, perfumes or deodorant. Do not shave 48 hours prior to surgery.   Do not bring valuables to the hospital. Do not wear nail polish, gel polish, artificial nails, or any other type of covering on natural nails (fingers and toes) If you have artificial  nails or gel coating that need to be removed by a nail salon, please have this removed prior to surgery. Artificial nails or gel coating may interfere with anesthesia's ability to adequately monitor your vital signs.  [name] is not responsible for any belongings or valuables.    Do NOT Smoke (Tobacco/Vaping)  24 hours prior to your procedure   Contacts, glasses, hearing aids, dentures or partials may not be worn into surgery, please bring cases for these belongings   For patients admitted to the hospital, discharge time will be determined by your treatment team.   SURGICAL WAITING ROOM VISITATION Patients having surgery or a procedure may have no more than 2 support people in the waiting area - these visitors may rotate.   Children under the age of 1 must have an adult with them who is not the patient. If the patient needs to stay at the hospital during part of their recovery, the visitor guidelines for inpatient rooms apply. Pre-op nurse will coordinate an appropriate time for 1 support person to accompany patient in pre-op.  This support person may not rotate.   Please refer to [email or web address] for the visitor guidelines for Inpatients (after your surgery is over and you are in a regular room).    Special instructions:    Oral Hygiene is also important to reduce your risk of infection.  Remember - BRUSH YOUR TEETH THE MORNING OF SURGERY WITH YOUR REGULAR TOOTHPASTE  Please read over the surgical instructions prior to day of surgery.

## 2022-08-08 ENCOUNTER — Ambulatory Visit (HOSPITAL_BASED_OUTPATIENT_CLINIC_OR_DEPARTMENT_OTHER): Payer: Medicare Other

## 2022-08-09 ENCOUNTER — Encounter (HOSPITAL_COMMUNITY)
Admission: RE | Disposition: A | Discharge status: Rehabilitation Facility | Payer: Self-pay | Source: Home / Self Care | Attending: Neurological Surgery

## 2022-08-09 ENCOUNTER — Inpatient Hospital Stay (HOSPITAL_COMMUNITY): Payer: Medicare Other | Admitting: Physician Assistant

## 2022-08-09 ENCOUNTER — Other Ambulatory Visit: Payer: Self-pay

## 2022-08-09 ENCOUNTER — Inpatient Hospital Stay (HOSPITAL_COMMUNITY): Payer: Medicare Other

## 2022-08-09 ENCOUNTER — Inpatient Hospital Stay (HOSPITAL_COMMUNITY)
Admission: RE | Admit: 2022-08-09 | Discharge: 2022-09-03 | DRG: 003 | Disposition: A | Discharge status: Rehabilitation Facility | Payer: Medicare Other | Attending: Neurological Surgery | Admitting: Neurological Surgery

## 2022-08-09 ENCOUNTER — Encounter (HOSPITAL_COMMUNITY): Payer: Self-pay | Admitting: Neurological Surgery

## 2022-08-09 DIAGNOSIS — T84226A Displacement of internal fixation device of vertebrae, initial encounter: Secondary | ICD-10-CM | POA: Diagnosis present

## 2022-08-09 DIAGNOSIS — F17210 Nicotine dependence, cigarettes, uncomplicated: Secondary | ICD-10-CM | POA: Diagnosis not present

## 2022-08-09 DIAGNOSIS — T83511A Infection and inflammatory reaction due to indwelling urethral catheter, initial encounter: Secondary | ICD-10-CM | POA: Diagnosis not present

## 2022-08-09 DIAGNOSIS — E222 Syndrome of inappropriate secretion of antidiuretic hormone: Secondary | ICD-10-CM | POA: Diagnosis present

## 2022-08-09 DIAGNOSIS — T8132XA Disruption of internal operation (surgical) wound, not elsewhere classified, initial encounter: Secondary | ICD-10-CM | POA: Diagnosis present

## 2022-08-09 DIAGNOSIS — G9741 Accidental puncture or laceration of dura during a procedure: Secondary | ICD-10-CM | POA: Diagnosis not present

## 2022-08-09 DIAGNOSIS — Z8249 Family history of ischemic heart disease and other diseases of the circulatory system: Secondary | ICD-10-CM

## 2022-08-09 DIAGNOSIS — F4323 Adjustment disorder with mixed anxiety and depressed mood: Secondary | ICD-10-CM | POA: Diagnosis not present

## 2022-08-09 DIAGNOSIS — R509 Fever, unspecified: Secondary | ICD-10-CM | POA: Diagnosis not present

## 2022-08-09 DIAGNOSIS — F02B0 Dementia in other diseases classified elsewhere, moderate, without behavioral disturbance, psychotic disturbance, mood disturbance, and anxiety: Secondary | ICD-10-CM | POA: Diagnosis not present

## 2022-08-09 DIAGNOSIS — E440 Moderate protein-calorie malnutrition: Secondary | ICD-10-CM | POA: Diagnosis not present

## 2022-08-09 DIAGNOSIS — E1165 Type 2 diabetes mellitus with hyperglycemia: Secondary | ICD-10-CM | POA: Diagnosis not present

## 2022-08-09 DIAGNOSIS — E861 Hypovolemia: Secondary | ICD-10-CM | POA: Diagnosis not present

## 2022-08-09 DIAGNOSIS — E1169 Type 2 diabetes mellitus with other specified complication: Secondary | ICD-10-CM | POA: Diagnosis not present

## 2022-08-09 DIAGNOSIS — G928 Other toxic encephalopathy: Secondary | ICD-10-CM | POA: Diagnosis not present

## 2022-08-09 DIAGNOSIS — J690 Pneumonitis due to inhalation of food and vomit: Secondary | ICD-10-CM

## 2022-08-09 DIAGNOSIS — R1310 Dysphagia, unspecified: Secondary | ICD-10-CM | POA: Diagnosis not present

## 2022-08-09 DIAGNOSIS — K219 Gastro-esophageal reflux disease without esophagitis: Secondary | ICD-10-CM | POA: Diagnosis present

## 2022-08-09 DIAGNOSIS — A86 Unspecified viral encephalitis: Secondary | ICD-10-CM | POA: Diagnosis not present

## 2022-08-09 DIAGNOSIS — Q210 Ventricular septal defect: Secondary | ICD-10-CM

## 2022-08-09 DIAGNOSIS — D6489 Other specified anemias: Secondary | ICD-10-CM | POA: Diagnosis not present

## 2022-08-09 DIAGNOSIS — S31000A Unspecified open wound of lower back and pelvis without penetration into retroperitoneum, initial encounter: Secondary | ICD-10-CM | POA: Diagnosis not present

## 2022-08-09 DIAGNOSIS — T8131XA Disruption of external operation (surgical) wound, not elsewhere classified, initial encounter: Secondary | ICD-10-CM | POA: Diagnosis not present

## 2022-08-09 DIAGNOSIS — N179 Acute kidney failure, unspecified: Secondary | ICD-10-CM | POA: Diagnosis not present

## 2022-08-09 DIAGNOSIS — E119 Type 2 diabetes mellitus without complications: Secondary | ICD-10-CM | POA: Diagnosis not present

## 2022-08-09 DIAGNOSIS — Z87440 Personal history of urinary (tract) infections: Secondary | ICD-10-CM

## 2022-08-09 DIAGNOSIS — R600 Localized edema: Secondary | ICD-10-CM | POA: Diagnosis not present

## 2022-08-09 DIAGNOSIS — T148XXA Other injury of unspecified body region, initial encounter: Secondary | ICD-10-CM | POA: Diagnosis not present

## 2022-08-09 DIAGNOSIS — G9340 Encephalopathy, unspecified: Secondary | ICD-10-CM

## 2022-08-09 DIAGNOSIS — Z96641 Presence of right artificial hip joint: Secondary | ICD-10-CM | POA: Diagnosis present

## 2022-08-09 DIAGNOSIS — E872 Acidosis, unspecified: Secondary | ICD-10-CM | POA: Diagnosis not present

## 2022-08-09 DIAGNOSIS — E876 Hypokalemia: Secondary | ICD-10-CM | POA: Diagnosis not present

## 2022-08-09 DIAGNOSIS — J440 Chronic obstructive pulmonary disease with acute lower respiratory infection: Secondary | ICD-10-CM | POA: Diagnosis not present

## 2022-08-09 DIAGNOSIS — F418 Other specified anxiety disorders: Secondary | ICD-10-CM

## 2022-08-09 DIAGNOSIS — T8130XA Disruption of wound, unspecified, initial encounter: Principal | ICD-10-CM | POA: Diagnosis present

## 2022-08-09 DIAGNOSIS — R339 Retention of urine, unspecified: Secondary | ICD-10-CM | POA: Diagnosis not present

## 2022-08-09 DIAGNOSIS — E878 Other disorders of electrolyte and fluid balance, not elsewhere classified: Secondary | ICD-10-CM | POA: Diagnosis not present

## 2022-08-09 DIAGNOSIS — I110 Hypertensive heart disease with heart failure: Secondary | ICD-10-CM | POA: Diagnosis present

## 2022-08-09 DIAGNOSIS — R197 Diarrhea, unspecified: Secondary | ICD-10-CM | POA: Diagnosis not present

## 2022-08-09 DIAGNOSIS — S32002A Unstable burst fracture of unspecified lumbar vertebra, initial encounter for closed fracture: Secondary | ICD-10-CM | POA: Diagnosis not present

## 2022-08-09 DIAGNOSIS — T8142XA Infection following a procedure, deep incisional surgical site, initial encounter: Secondary | ICD-10-CM

## 2022-08-09 DIAGNOSIS — R195 Other fecal abnormalities: Secondary | ICD-10-CM | POA: Diagnosis not present

## 2022-08-09 DIAGNOSIS — I5042 Chronic combined systolic (congestive) and diastolic (congestive) heart failure: Secondary | ICD-10-CM | POA: Diagnosis present

## 2022-08-09 DIAGNOSIS — E039 Hypothyroidism, unspecified: Secondary | ICD-10-CM | POA: Diagnosis present

## 2022-08-09 DIAGNOSIS — G039 Meningitis, unspecified: Secondary | ICD-10-CM | POA: Diagnosis not present

## 2022-08-09 DIAGNOSIS — M4620 Osteomyelitis of vertebra, site unspecified: Secondary | ICD-10-CM | POA: Diagnosis not present

## 2022-08-09 DIAGNOSIS — M869 Osteomyelitis, unspecified: Secondary | ICD-10-CM | POA: Diagnosis not present

## 2022-08-09 DIAGNOSIS — N319 Neuromuscular dysfunction of bladder, unspecified: Secondary | ICD-10-CM | POA: Diagnosis not present

## 2022-08-09 DIAGNOSIS — M545 Low back pain, unspecified: Secondary | ICD-10-CM | POA: Diagnosis not present

## 2022-08-09 DIAGNOSIS — Z96652 Presence of left artificial knee joint: Secondary | ICD-10-CM | POA: Diagnosis present

## 2022-08-09 DIAGNOSIS — R6520 Severe sepsis without septic shock: Secondary | ICD-10-CM | POA: Diagnosis not present

## 2022-08-09 DIAGNOSIS — R4182 Altered mental status, unspecified: Secondary | ICD-10-CM | POA: Diagnosis not present

## 2022-08-09 DIAGNOSIS — D689 Coagulation defect, unspecified: Secondary | ICD-10-CM | POA: Diagnosis not present

## 2022-08-09 DIAGNOSIS — R609 Edema, unspecified: Secondary | ICD-10-CM | POA: Diagnosis not present

## 2022-08-09 DIAGNOSIS — T40605A Adverse effect of unspecified narcotics, initial encounter: Secondary | ICD-10-CM | POA: Diagnosis not present

## 2022-08-09 DIAGNOSIS — R112 Nausea with vomiting, unspecified: Secondary | ICD-10-CM | POA: Diagnosis not present

## 2022-08-09 DIAGNOSIS — Z820 Family history of epilepsy and other diseases of the nervous system: Secondary | ICD-10-CM

## 2022-08-09 DIAGNOSIS — R110 Nausea: Secondary | ICD-10-CM | POA: Diagnosis not present

## 2022-08-09 DIAGNOSIS — F319 Bipolar disorder, unspecified: Secondary | ICD-10-CM | POA: Diagnosis not present

## 2022-08-09 DIAGNOSIS — Z930 Tracheostomy status: Secondary | ICD-10-CM | POA: Diagnosis not present

## 2022-08-09 DIAGNOSIS — G3184 Mild cognitive impairment, so stated: Secondary | ICD-10-CM | POA: Diagnosis not present

## 2022-08-09 DIAGNOSIS — D638 Anemia in other chronic diseases classified elsewhere: Secondary | ICD-10-CM | POA: Diagnosis not present

## 2022-08-09 DIAGNOSIS — J9601 Acute respiratory failure with hypoxia: Secondary | ICD-10-CM

## 2022-08-09 DIAGNOSIS — G301 Alzheimer's disease with late onset: Secondary | ICD-10-CM | POA: Diagnosis not present

## 2022-08-09 DIAGNOSIS — Y793 Surgical instruments, materials and orthopedic devices (including sutures) associated with adverse incidents: Secondary | ICD-10-CM | POA: Diagnosis present

## 2022-08-09 DIAGNOSIS — F3175 Bipolar disorder, in partial remission, most recent episode depressed: Secondary | ICD-10-CM | POA: Diagnosis not present

## 2022-08-09 DIAGNOSIS — E860 Dehydration: Secondary | ICD-10-CM | POA: Diagnosis not present

## 2022-08-09 DIAGNOSIS — G0490 Encephalitis and encephalomyelitis, unspecified: Secondary | ICD-10-CM | POA: Diagnosis not present

## 2022-08-09 DIAGNOSIS — I10 Essential (primary) hypertension: Secondary | ICD-10-CM | POA: Diagnosis not present

## 2022-08-09 DIAGNOSIS — I2510 Atherosclerotic heart disease of native coronary artery without angina pectoris: Secondary | ICD-10-CM | POA: Diagnosis present

## 2022-08-09 DIAGNOSIS — Z823 Family history of stroke: Secondary | ICD-10-CM

## 2022-08-09 DIAGNOSIS — I5023 Acute on chronic systolic (congestive) heart failure: Secondary | ICD-10-CM | POA: Diagnosis not present

## 2022-08-09 DIAGNOSIS — R5381 Other malaise: Secondary | ICD-10-CM | POA: Diagnosis not present

## 2022-08-09 DIAGNOSIS — E871 Hypo-osmolality and hyponatremia: Secondary | ICD-10-CM | POA: Diagnosis not present

## 2022-08-09 DIAGNOSIS — M1990 Unspecified osteoarthritis, unspecified site: Secondary | ICD-10-CM | POA: Diagnosis present

## 2022-08-09 DIAGNOSIS — G4700 Insomnia, unspecified: Secondary | ICD-10-CM | POA: Diagnosis present

## 2022-08-09 DIAGNOSIS — M4646 Discitis, unspecified, lumbar region: Secondary | ICD-10-CM | POA: Diagnosis not present

## 2022-08-09 DIAGNOSIS — D62 Acute posthemorrhagic anemia: Secondary | ICD-10-CM | POA: Diagnosis not present

## 2022-08-09 DIAGNOSIS — G009 Bacterial meningitis, unspecified: Secondary | ICD-10-CM | POA: Diagnosis not present

## 2022-08-09 DIAGNOSIS — F5101 Primary insomnia: Secondary | ICD-10-CM | POA: Diagnosis not present

## 2022-08-09 DIAGNOSIS — T847XXA Infection and inflammatory reaction due to other internal orthopedic prosthetic devices, implants and grafts, initial encounter: Secondary | ICD-10-CM | POA: Diagnosis not present

## 2022-08-09 DIAGNOSIS — R519 Headache, unspecified: Secondary | ICD-10-CM | POA: Diagnosis not present

## 2022-08-09 DIAGNOSIS — A419 Sepsis, unspecified organism: Secondary | ICD-10-CM

## 2022-08-09 DIAGNOSIS — M4626 Osteomyelitis of vertebra, lumbar region: Secondary | ICD-10-CM | POA: Diagnosis not present

## 2022-08-09 DIAGNOSIS — R0789 Other chest pain: Secondary | ICD-10-CM | POA: Diagnosis not present

## 2022-08-09 DIAGNOSIS — T8131XS Disruption of external operation (surgical) wound, not elsewhere classified, sequela: Secondary | ICD-10-CM | POA: Diagnosis not present

## 2022-08-09 DIAGNOSIS — M546 Pain in thoracic spine: Secondary | ICD-10-CM | POA: Diagnosis not present

## 2022-08-09 DIAGNOSIS — Z79899 Other long term (current) drug therapy: Secondary | ICD-10-CM

## 2022-08-09 DIAGNOSIS — T847XXD Infection and inflammatory reaction due to other internal orthopedic prosthetic devices, implants and grafts, subsequent encounter: Secondary | ICD-10-CM | POA: Diagnosis not present

## 2022-08-09 DIAGNOSIS — R739 Hyperglycemia, unspecified: Secondary | ICD-10-CM | POA: Diagnosis not present

## 2022-08-09 DIAGNOSIS — Y731 Therapeutic (nonsurgical) and rehabilitative gastroenterology and urology devices associated with adverse incidents: Secondary | ICD-10-CM | POA: Diagnosis not present

## 2022-08-09 DIAGNOSIS — R4586 Emotional lability: Secondary | ICD-10-CM | POA: Diagnosis not present

## 2022-08-09 DIAGNOSIS — M8580 Other specified disorders of bone density and structure, unspecified site: Secondary | ICD-10-CM | POA: Diagnosis present

## 2022-08-09 DIAGNOSIS — T8142XD Infection following a procedure, deep incisional surgical site, subsequent encounter: Secondary | ICD-10-CM | POA: Diagnosis not present

## 2022-08-09 DIAGNOSIS — G9341 Metabolic encephalopathy: Secondary | ICD-10-CM | POA: Diagnosis not present

## 2022-08-09 DIAGNOSIS — T8141XA Infection following a procedure, superficial incisional surgical site, initial encounter: Secondary | ICD-10-CM | POA: Diagnosis not present

## 2022-08-09 DIAGNOSIS — M25512 Pain in left shoulder: Secondary | ICD-10-CM | POA: Diagnosis not present

## 2022-08-09 DIAGNOSIS — M8448XD Pathological fracture, other site, subsequent encounter for fracture with routine healing: Secondary | ICD-10-CM | POA: Diagnosis present

## 2022-08-09 DIAGNOSIS — T847XXS Infection and inflammatory reaction due to other internal orthopedic prosthetic devices, implants and grafts, sequela: Secondary | ICD-10-CM | POA: Diagnosis not present

## 2022-08-09 DIAGNOSIS — Z6829 Body mass index (BMI) 29.0-29.9, adult: Secondary | ICD-10-CM

## 2022-08-09 DIAGNOSIS — D75839 Thrombocytosis, unspecified: Secondary | ICD-10-CM | POA: Diagnosis not present

## 2022-08-09 DIAGNOSIS — G8929 Other chronic pain: Secondary | ICD-10-CM | POA: Diagnosis not present

## 2022-08-09 DIAGNOSIS — R569 Unspecified convulsions: Secondary | ICD-10-CM | POA: Diagnosis not present

## 2022-08-09 DIAGNOSIS — E7800 Pure hypercholesterolemia, unspecified: Secondary | ICD-10-CM | POA: Diagnosis present

## 2022-08-09 DIAGNOSIS — K121 Other forms of stomatitis: Secondary | ICD-10-CM | POA: Diagnosis not present

## 2022-08-09 DIAGNOSIS — F39 Unspecified mood [affective] disorder: Secondary | ICD-10-CM | POA: Diagnosis not present

## 2022-08-09 DIAGNOSIS — K449 Diaphragmatic hernia without obstruction or gangrene: Secondary | ICD-10-CM | POA: Diagnosis present

## 2022-08-09 HISTORY — DX: Bipolar disorder, unspecified: F31.9

## 2022-08-09 LAB — CBC
HCT: 33 % — ABNORMAL LOW (ref 36.0–46.0)
Hemoglobin: 10.6 g/dL — ABNORMAL LOW (ref 12.0–15.0)
MCH: 31.8 pg (ref 26.0–34.0)
MCHC: 32.1 g/dL (ref 30.0–36.0)
MCV: 99.1 fL (ref 80.0–100.0)
Platelets: 366 10*3/uL (ref 150–400)
RBC: 3.33 MIL/uL — ABNORMAL LOW (ref 3.87–5.11)
RDW: 15.8 % — ABNORMAL HIGH (ref 11.5–15.5)
WBC: 8.6 10*3/uL (ref 4.0–10.5)
nRBC: 0 % (ref 0.0–0.2)

## 2022-08-09 LAB — BASIC METABOLIC PANEL
Anion gap: 14 (ref 5–15)
BUN: 6 mg/dL — ABNORMAL LOW (ref 8–23)
CO2: 20 mmol/L — ABNORMAL LOW (ref 22–32)
Calcium: 7.9 mg/dL — ABNORMAL LOW (ref 8.9–10.3)
Chloride: 95 mmol/L — ABNORMAL LOW (ref 98–111)
Creatinine, Ser: 0.31 mg/dL — ABNORMAL LOW (ref 0.44–1.00)
GFR, Estimated: >60 mL/min (ref >60)
Glucose, Bld: 88 mg/dL (ref 70–99)
Potassium: 2.7 mmol/L — CL (ref 3.5–5.1)
Sodium: 129 mmol/L — ABNORMAL LOW (ref 135–145)

## 2022-08-09 LAB — TYPE AND SCREEN
ABO/RH(D): O POS
Antibody Screen: NEGATIVE

## 2022-08-09 LAB — GLUCOSE, CAPILLARY
Glucose-Capillary: 93 mg/dL (ref 70–99)
Glucose-Capillary: 90 mg/dL (ref 70–99)
Glucose-Capillary: 91 mg/dL (ref 70–99)

## 2022-08-09 LAB — SURGICAL PCR SCREEN
MRSA, PCR: NEGATIVE
Staphylococcus aureus: POSITIVE — AB

## 2022-08-09 SURGERY — POSTERIOR LUMBAR FUSION 1 LEVEL
Anesthesia: General | Site: Back

## 2022-08-09 MED ORDER — LOSARTAN POTASSIUM 50 MG PO TABS
100.0000 mg | ORAL_TABLET | Freq: Every day | ORAL | Status: DC
  Administered 2022-08-09 – 2022-08-17 (×8): 100 mg via ORAL
  Filled 2022-08-09 (×8): qty 2

## 2022-08-09 MED ORDER — POTASSIUM CHLORIDE 10 MEQ/100ML IV SOLN
10.0000 meq | INTRAVENOUS | Status: AC
  Administered 2022-08-09 (×3): 10 meq via INTRAVENOUS

## 2022-08-09 MED ORDER — CEFAZOLIN SODIUM-DEXTROSE 2-4 GM/100ML-% IV SOLN
2.0000 g | INTRAVENOUS | Status: AC
  Administered 2022-08-09: 2 g via INTRAVENOUS

## 2022-08-09 MED ORDER — LACTATED RINGERS IV SOLN: INTRAVENOUS | Status: DC

## 2022-08-09 MED ORDER — AMLODIPINE BESYLATE 5 MG PO TABS
5.0000 mg | ORAL_TABLET | Freq: Every morning | ORAL | Status: DC
  Administered 2022-08-13 – 2022-08-18 (×6): 5 mg via ORAL
  Filled 2022-08-09 (×9): qty 1

## 2022-08-09 MED ORDER — ONDANSETRON HCL 4 MG/2ML IJ SOLN
INTRAMUSCULAR | Status: AC
  Filled 2022-08-09: qty 2

## 2022-08-09 MED ORDER — ADULT MULTIVITAMIN W/MINERALS CH
1.0000 | ORAL_TABLET | Freq: Every day | ORAL | Status: DC
  Administered 2022-08-09 – 2022-08-19 (×11): 1 via ORAL
  Filled 2022-08-09 (×12): qty 1

## 2022-08-09 MED ORDER — CHLORHEXIDINE GLUCONATE 0.12 % MT SOLN
15.0000 mL | Freq: Once | OROMUCOSAL | Status: AC
  Administered 2022-08-09: 15 mL via OROMUCOSAL

## 2022-08-09 MED ORDER — SODIUM CHLORIDE 0.9% FLUSH: 3.0000 mL | INTRAVENOUS | Status: DC | PRN

## 2022-08-09 MED ORDER — SODIUM CHLORIDE 1 G PO TABS
1.0000 g | ORAL_TABLET | ORAL | Status: DC
  Administered 2022-08-09 – 2022-08-11 (×2): 1 g via ORAL
  Filled 2022-08-09 (×2): qty 1

## 2022-08-09 MED ORDER — LIDOCAINE 2% (20 MG/ML) 5 ML SYRINGE
INTRAMUSCULAR | Status: DC | PRN
  Administered 2022-08-09: 80 mg via INTRAVENOUS

## 2022-08-09 MED ORDER — MENTHOL 3 MG MT LOZG: 1.0000 | LOZENGE | OROMUCOSAL | Status: DC | PRN

## 2022-08-09 MED ORDER — SEMAGLUTIDE (2 MG/DOSE) 8 MG/3ML ~~LOC~~ SOPN: 2.0000 mg | PEN_INJECTOR | SUBCUTANEOUS | Status: DC

## 2022-08-09 MED ORDER — EPHEDRINE 5 MG/ML INJ
INTRAVENOUS | Status: AC
  Filled 2022-08-09: qty 10

## 2022-08-09 MED ORDER — LACTATED RINGERS IV SOLN: INTRAVENOUS | Status: DC | PRN

## 2022-08-09 MED ORDER — OXYCODONE HCL 5 MG PO TABS
10.0000 mg | ORAL_TABLET | ORAL | Status: DC | PRN
  Administered 2022-08-09 – 2022-08-12 (×3): 10 mg via ORAL
  Filled 2022-08-09 (×6): qty 2

## 2022-08-09 MED ORDER — ONDANSETRON HCL 4 MG PO TABS: 4.0000 mg | ORAL_TABLET | Freq: Four times a day (QID) | ORAL | Status: DC | PRN

## 2022-08-09 MED ORDER — DONEPEZIL HCL 10 MG PO TABS
10.0000 mg | ORAL_TABLET | Freq: Every day | ORAL | Status: DC
  Administered 2022-08-09 – 2022-08-19 (×11): 10 mg via ORAL
  Filled 2022-08-09 (×12): qty 1

## 2022-08-09 MED ORDER — POTASSIUM CHLORIDE 10 MEQ/100ML IV SOLN
INTRAVENOUS | Status: AC
  Filled 2022-08-09: qty 200

## 2022-08-09 MED ORDER — SODIUM CHLORIDE 0.9 % IV SOLN: 250.0000 mL | INTRAVENOUS | Status: DC

## 2022-08-09 MED ORDER — MIDAZOLAM HCL 2 MG/2ML IJ SOLN
INTRAMUSCULAR | Status: DC | PRN
  Administered 2022-08-09 (×2): 1 mg via INTRAVENOUS

## 2022-08-09 MED ORDER — PHENOL 1.4 % MT LIQD: 1.0000 | OROMUCOSAL | Status: DC | PRN

## 2022-08-09 MED ORDER — PROPOFOL 10 MG/ML IV BOLUS
INTRAVENOUS | Status: AC
  Filled 2022-08-09: qty 20

## 2022-08-09 MED ORDER — PHENYLEPHRINE 80 MCG/ML (10ML) SYRINGE FOR IV PUSH (FOR BLOOD PRESSURE SUPPORT)
PREFILLED_SYRINGE | INTRAVENOUS | Status: DC | PRN
  Administered 2022-08-09 (×5): 80 ug via INTRAVENOUS

## 2022-08-09 MED ORDER — DOCUSATE SODIUM 100 MG PO CAPS
100.0000 mg | ORAL_CAPSULE | Freq: Two times a day (BID) | ORAL | Status: DC
  Administered 2022-08-09 – 2022-08-14 (×10): 100 mg via ORAL
  Filled 2022-08-09 (×11): qty 1

## 2022-08-09 MED ORDER — ORAL CARE MOUTH RINSE: 15.0000 mL | Freq: Once | OROMUCOSAL | Status: AC

## 2022-08-09 MED ORDER — CYCLOBENZAPRINE HCL 10 MG PO TABS: 10.0000 mg | ORAL_TABLET | Freq: Three times a day (TID) | ORAL | Status: DC | PRN

## 2022-08-09 MED ORDER — THROMBIN 5000 UNITS EX SOLR
CUTANEOUS | Status: AC
  Filled 2022-08-09: qty 5000

## 2022-08-09 MED ORDER — CEFAZOLIN SODIUM-DEXTROSE 2-4 GM/100ML-% IV SOLN
2.0000 g | Freq: Three times a day (TID) | INTRAVENOUS | Status: AC
  Administered 2022-08-09 – 2022-08-10 (×2): 2 g via INTRAVENOUS
  Filled 2022-08-09 (×2): qty 100

## 2022-08-09 MED ORDER — ACETAMINOPHEN 325 MG PO TABS
650.0000 mg | ORAL_TABLET | ORAL | Status: DC | PRN
  Administered 2022-08-14 – 2022-08-18 (×7): 650 mg via ORAL
  Filled 2022-08-09 (×8): qty 2

## 2022-08-09 MED ORDER — GABAPENTIN 100 MG PO CAPS
200.0000 mg | ORAL_CAPSULE | Freq: Three times a day (TID) | ORAL | Status: DC
  Administered 2022-08-09 – 2022-08-18 (×27): 200 mg via ORAL
  Filled 2022-08-09 (×27): qty 2

## 2022-08-09 MED ORDER — BUPIVACAINE HCL (PF) 0.5 % IJ SOLN
INTRAMUSCULAR | Status: AC
  Filled 2022-08-09: qty 30

## 2022-08-09 MED ORDER — LIDOCAINE 2% (20 MG/ML) 5 ML SYRINGE
INTRAMUSCULAR | Status: AC
  Filled 2022-08-09: qty 10

## 2022-08-09 MED ORDER — MIDAZOLAM HCL 2 MG/2ML IJ SOLN
INTRAMUSCULAR | Status: AC
  Filled 2022-08-09: qty 2

## 2022-08-09 MED ORDER — FLUVOXAMINE MALEATE 100 MG PO TABS
100.0000 mg | ORAL_TABLET | Freq: Every day | ORAL | Status: DC
  Administered 2022-08-09 – 2022-08-17 (×9): 100 mg via ORAL
  Filled 2022-08-09 (×10): qty 1

## 2022-08-09 MED ORDER — LIDOCAINE-EPINEPHRINE 1 %-1:100000 IJ SOLN
INTRAMUSCULAR | Status: AC
  Filled 2022-08-09: qty 1

## 2022-08-09 MED ORDER — ACETAMINOPHEN 500 MG PO TABS
ORAL_TABLET | ORAL | Status: AC
  Filled 2022-08-09: qty 2

## 2022-08-09 MED ORDER — CEFAZOLIN SODIUM-DEXTROSE 2-4 GM/100ML-% IV SOLN
INTRAVENOUS | Status: AC
  Filled 2022-08-09: qty 100

## 2022-08-09 MED ORDER — SODIUM CHLORIDE 0.9% FLUSH
3.0000 mL | Freq: Two times a day (BID) | INTRAVENOUS | Status: DC
  Administered 2022-08-09 – 2022-09-01 (×38): 3 mL via INTRAVENOUS

## 2022-08-09 MED ORDER — CARVEDILOL 3.125 MG PO TABS
6.2500 mg | ORAL_TABLET | Freq: Two times a day (BID) | ORAL | Status: DC
  Administered 2022-08-09 – 2022-08-19 (×17): 6.25 mg via ORAL
  Filled 2022-08-09 (×19): qty 1

## 2022-08-09 MED ORDER — SUGAMMADEX SODIUM 200 MG/2ML IV SOLN
INTRAVENOUS | Status: DC | PRN
  Administered 2022-08-09: 200 mg via INTRAVENOUS

## 2022-08-09 MED ORDER — HYDROMORPHONE HCL 1 MG/ML IJ SOLN
INTRAMUSCULAR | Status: AC
  Filled 2022-08-09: qty 1

## 2022-08-09 MED ORDER — POLYETHYLENE GLYCOL 3350 17 G PO PACK: 17.0000 g | PACK | Freq: Every day | ORAL | Status: DC | PRN

## 2022-08-09 MED ORDER — FENTANYL CITRATE (PF) 250 MCG/5ML IJ SOLN
INTRAMUSCULAR | Status: AC
  Filled 2022-08-09: qty 5

## 2022-08-09 MED ORDER — NICOTINE POLACRILEX 2 MG MT GUM
2.0000 mg | CHEWING_GUM | Freq: Every day | OROMUCOSAL | Status: DC
  Administered 2022-08-13 – 2022-08-16 (×3): 2 mg via ORAL
  Filled 2022-08-09 (×12): qty 1

## 2022-08-09 MED ORDER — METFORMIN HCL 500 MG PO TABS
500.0000 mg | ORAL_TABLET | Freq: Two times a day (BID) | ORAL | Status: DC
  Administered 2022-08-09 – 2022-08-18 (×18): 500 mg via ORAL
  Filled 2022-08-09 (×18): qty 1

## 2022-08-09 MED ORDER — HYDROMORPHONE HCL 1 MG/ML IJ SOLN
1.0000 mg | INTRAMUSCULAR | Status: DC | PRN
  Administered 2022-08-09 – 2022-08-11 (×6): 1 mg via INTRAVENOUS
  Filled 2022-08-09 (×6): qty 1

## 2022-08-09 MED ORDER — ACETAMINOPHEN 500 MG PO TABS
1000.0000 mg | ORAL_TABLET | Freq: Once | ORAL | Status: AC
  Administered 2022-08-09: 1000 mg via ORAL

## 2022-08-09 MED ORDER — LAMOTRIGINE 100 MG PO TABS
200.0000 mg | ORAL_TABLET | Freq: Every day | ORAL | Status: DC
  Administered 2022-08-09 – 2022-08-19 (×11): 200 mg via ORAL
  Filled 2022-08-09 (×13): qty 2

## 2022-08-09 MED ORDER — POTASSIUM CHLORIDE 10 MEQ/100ML IV SOLN
INTRAVENOUS | Status: AC
  Filled 2022-08-09: qty 100

## 2022-08-09 MED ORDER — QUETIAPINE FUMARATE 300 MG PO TABS
600.0000 mg | ORAL_TABLET | Freq: Every day | ORAL | Status: DC
  Administered 2022-08-09 – 2022-08-16 (×8): 600 mg via ORAL
  Filled 2022-08-09 (×8): qty 2

## 2022-08-09 MED ORDER — ROCURONIUM BROMIDE 10 MG/ML (PF) SYRINGE
PREFILLED_SYRINGE | INTRAVENOUS | Status: DC | PRN
  Administered 2022-08-09: 50 mg via INTRAVENOUS
  Administered 2022-08-09: 10 mg via INTRAVENOUS
  Administered 2022-08-09: 30 mg via INTRAVENOUS

## 2022-08-09 MED ORDER — DEXAMETHASONE SODIUM PHOSPHATE 10 MG/ML IJ SOLN
INTRAMUSCULAR | Status: DC | PRN
  Administered 2022-08-09: 5 mg via INTRAVENOUS

## 2022-08-09 MED ORDER — CEFAZOLIN SODIUM 1 G IJ SOLR
INTRAMUSCULAR | Status: AC
  Filled 2022-08-09: qty 20

## 2022-08-09 MED ORDER — FENTANYL CITRATE (PF) 250 MCG/5ML IJ SOLN
INTRAMUSCULAR | Status: DC | PRN
  Administered 2022-08-09: 50 ug via INTRAVENOUS
  Administered 2022-08-09: 25 ug via INTRAVENOUS
  Administered 2022-08-09 (×2): 50 ug via INTRAVENOUS

## 2022-08-09 MED ORDER — ROCURONIUM BROMIDE 10 MG/ML (PF) SYRINGE
PREFILLED_SYRINGE | INTRAVENOUS | Status: AC
  Filled 2022-08-09: qty 30

## 2022-08-09 MED ORDER — HYDROMORPHONE HCL 1 MG/ML IJ SOLN
0.2500 mg | INTRAMUSCULAR | Status: DC | PRN
  Administered 2022-08-09 (×3): 0.5 mg via INTRAVENOUS

## 2022-08-09 MED ORDER — ACETAMINOPHEN 650 MG RE SUPP
650.0000 mg | RECTAL | Status: DC | PRN
  Administered 2022-08-19 – 2022-08-20 (×4): 650 mg via RECTAL
  Filled 2022-08-09 (×4): qty 1

## 2022-08-09 MED ORDER — 0.9 % SODIUM CHLORIDE (POUR BTL) OPTIME
TOPICAL | Status: DC | PRN
  Administered 2022-08-09: 1000 mL

## 2022-08-09 MED ORDER — DEXAMETHASONE SODIUM PHOSPHATE 10 MG/ML IJ SOLN
INTRAMUSCULAR | Status: AC
  Filled 2022-08-09: qty 1

## 2022-08-09 MED ORDER — OXYCODONE HCL 5 MG PO TABS
5.0000 mg | ORAL_TABLET | ORAL | Status: DC | PRN
  Administered 2022-08-10 – 2022-08-11 (×2): 5 mg via ORAL
  Filled 2022-08-09: qty 1

## 2022-08-09 MED ORDER — PANTOPRAZOLE SODIUM 40 MG PO TBEC
80.0000 mg | DELAYED_RELEASE_TABLET | Freq: Every day | ORAL | Status: DC
  Administered 2022-08-09 – 2022-08-17 (×9): 80 mg via ORAL
  Filled 2022-08-09 (×9): qty 2

## 2022-08-09 MED ORDER — LIDOCAINE-EPINEPHRINE 1 %-1:100000 IJ SOLN
INTRAMUSCULAR | Status: DC | PRN
  Administered 2022-08-09: 5 mL

## 2022-08-09 MED ORDER — CHLORHEXIDINE GLUCONATE 0.12 % MT SOLN
OROMUCOSAL | Status: AC
  Filled 2022-08-09: qty 15

## 2022-08-09 MED ORDER — THROMBIN 5000 UNITS EX SOLR
OROMUCOSAL | Status: DC | PRN
  Administered 2022-08-09: 5 mL via TOPICAL

## 2022-08-09 MED ORDER — PHENYLEPHRINE 80 MCG/ML (10ML) SYRINGE FOR IV PUSH (FOR BLOOD PRESSURE SUPPORT)
PREFILLED_SYRINGE | INTRAVENOUS | Status: AC
  Filled 2022-08-09: qty 20

## 2022-08-09 MED ORDER — CHLORHEXIDINE GLUCONATE CLOTH 2 % EX PADS: 6.0000 | MEDICATED_PAD | Freq: Once | CUTANEOUS | Status: DC

## 2022-08-09 MED ORDER — BUPIVACAINE HCL (PF) 0.5 % IJ SOLN
INTRAMUSCULAR | Status: DC | PRN
  Administered 2022-08-09: 5 mL

## 2022-08-09 MED ORDER — LEVOTHYROXINE SODIUM 88 MCG PO TABS
88.0000 ug | ORAL_TABLET | Freq: Every day | ORAL | Status: DC
  Administered 2022-08-10 – 2022-08-19 (×10): 88 ug via ORAL
  Filled 2022-08-09 (×11): qty 1

## 2022-08-09 MED ORDER — ONDANSETRON HCL 4 MG/2ML IJ SOLN
4.0000 mg | Freq: Four times a day (QID) | INTRAMUSCULAR | Status: DC | PRN
  Administered 2022-08-14 – 2022-09-03 (×9): 4 mg via INTRAVENOUS
  Filled 2022-08-09 (×9): qty 2

## 2022-08-09 MED ORDER — BUPROPION HCL ER (XL) 150 MG PO TB24
300.0000 mg | ORAL_TABLET | Freq: Every morning | ORAL | Status: DC
  Administered 2022-08-10 – 2022-08-18 (×9): 300 mg via ORAL
  Filled 2022-08-09 (×9): qty 2

## 2022-08-09 MED ORDER — CYCLOBENZAPRINE HCL 5 MG PO TABS
5.0000 mg | ORAL_TABLET | Freq: Three times a day (TID) | ORAL | Status: DC
  Administered 2022-08-09 – 2022-08-13 (×9): 5 mg via ORAL
  Filled 2022-08-09 (×11): qty 1

## 2022-08-09 MED ORDER — PROPOFOL 10 MG/ML IV BOLUS
INTRAVENOUS | Status: DC | PRN
  Administered 2022-08-09: 50 mg via INTRAVENOUS
  Administered 2022-08-09: 30 mg via INTRAVENOUS
  Administered 2022-08-09: 25 mg via INTRAVENOUS

## 2022-08-09 MED ORDER — ONDANSETRON HCL 4 MG/2ML IJ SOLN
INTRAMUSCULAR | Status: DC | PRN
  Administered 2022-08-09: 4 mg via INTRAVENOUS

## 2022-08-09 SURGICAL SUPPLY — 77 items
ADH SKN CLS APL DERMABOND .7 (GAUZE/BANDAGES/DRESSINGS) ×2
APL SKNCLS STERI-STRIP NONHPOA (GAUZE/BANDAGES/DRESSINGS)
BAG COUNTER SPONGE SURGICOUNT (BAG) ×3 IMPLANT
BAG SPNG CNTER NS LX DISP (BAG) ×2
BASKET BONE COLLECTION (BASKET) ×3 IMPLANT
BENZOIN TINCTURE PRP APPL 2/3 (GAUZE/BANDAGES/DRESSINGS) IMPLANT
BLADE CLIPPER SURG (BLADE) IMPLANT
BLADE SURG 11 STRL SS (BLADE) ×2 IMPLANT
BUR 14 MATCH 3 (BUR) IMPLANT
BUR MATCHSTICK NEURO 3.0 LAGG (BURR) ×3 IMPLANT
BUR MR8 14CM BALL SYMTRI 5 (BUR) IMPLANT
BUR PRECISION FLUTE 5.0 (BURR) ×3 IMPLANT
BURR 14 MATCH 3 (BUR)
BURR MR8 14CM BALL SYMTRI 5 (BUR)
CANISTER SUCT 3000ML PPV (MISCELLANEOUS) ×3 IMPLANT
CNTNR URN SCR LID CUP LEK RST (MISCELLANEOUS) ×3 IMPLANT
CONT SPEC 4OZ STRL OR WHT (MISCELLANEOUS) ×2
COVER BACK TABLE 60X90IN (DRAPES) ×3 IMPLANT
COVERAGE SUPPORT O-ARM STEALTH (MISCELLANEOUS) ×2 IMPLANT
DERMABOND ADVANCED .7 DNX12 (GAUZE/BANDAGES/DRESSINGS) ×3 IMPLANT
DRAIN JACKSON PRT FLT 7MM (DRAIN) ×1 IMPLANT
DRAPE C-ARM 42X72 X-RAY (DRAPES) ×1 IMPLANT
DRAPE C-ARMOR (DRAPES) IMPLANT
DRAPE LAPAROTOMY 100X72X124 (DRAPES) ×3 IMPLANT
DRAPE SHEET LG 3/4 BI-LAMINATE (DRAPES) ×12 IMPLANT
DRAPE SURG 17X23 STRL (DRAPES) ×3 IMPLANT
DRSG COVADERM 4X10 (GAUZE/BANDAGES/DRESSINGS) ×1 IMPLANT
DRSG COVADERM 4X6 (GAUZE/BANDAGES/DRESSINGS) ×1 IMPLANT
DURAPREP 26ML APPLICATOR (WOUND CARE) ×3 IMPLANT
ELECT REM PT RETURN 9FT ADLT (ELECTROSURGICAL) ×2
ELECTRODE REM PT RTRN 9FT ADLT (ELECTROSURGICAL) ×3 IMPLANT
EVACUATOR SILICONE 100CC (DRAIN) ×1 IMPLANT
FEE COVERAGE SUPPORT O-ARM (MISCELLANEOUS) ×2 IMPLANT
GAUZE 4X4 16PLY ~~LOC~~+RFID DBL (SPONGE) IMPLANT
GAUZE SPONGE 4X4 12PLY STRL (GAUZE/BANDAGES/DRESSINGS) IMPLANT
GLOVE BIOGEL PI IND STRL 7.5 (GLOVE) ×6 IMPLANT
GLOVE ECLIPSE 7.5 STRL STRAW (GLOVE) ×6 IMPLANT
GLOVE EXAM NITRILE LRG STRL (GLOVE) IMPLANT
GLOVE EXAM NITRILE XL STR (GLOVE) IMPLANT
GLOVE EXAM NITRILE XS STR PU (GLOVE) IMPLANT
GOWN STRL REUS W/ TWL LRG LVL3 (GOWN DISPOSABLE) ×12 IMPLANT
GOWN STRL REUS W/ TWL XL LVL3 (GOWN DISPOSABLE) IMPLANT
GOWN STRL REUS W/TWL 2XL LVL3 (GOWN DISPOSABLE) IMPLANT
GOWN STRL REUS W/TWL LRG LVL3 (GOWN DISPOSABLE) ×8
GOWN STRL REUS W/TWL XL LVL3 (GOWN DISPOSABLE)
GRAFT BN 10X1XDBM MAGNIFUSE (Bone Implant) ×1 IMPLANT
GRAFT BONE MAGNIFUSE 1X10CM (Bone Implant) ×2 IMPLANT
HEMOSTAT POWDER KIT SURGIFOAM (HEMOSTASIS) ×3 IMPLANT
KIT BASIN OR (CUSTOM PROCEDURE TRAY) ×3 IMPLANT
KIT INFUSE MEDIUM (Orthopedic Implant) ×1 IMPLANT
KIT POSITION SURG JACKSON T1 (MISCELLANEOUS) ×3 IMPLANT
KIT TURNOVER KIT B (KITS) ×3 IMPLANT
MARKER SPHERE PSV REFLC NDI (MISCELLANEOUS) ×15 IMPLANT
MILL BONE PREP (MISCELLANEOUS) ×3 IMPLANT
NDL HYPO 18GX1.5 BLUNT FILL (NEEDLE) IMPLANT
NDL SPNL 18GX3.5 QUINCKE PK (NEEDLE) IMPLANT
NEEDLE HYPO 18GX1.5 BLUNT FILL (NEEDLE) IMPLANT
NEEDLE HYPO 22GX1.5 SAFETY (NEEDLE) ×3 IMPLANT
NEEDLE SPNL 18GX3.5 QUINCKE PK (NEEDLE) IMPLANT
NS IRRIG 1000ML POUR BTL (IV SOLUTION) ×3 IMPLANT
PACK LAMINECTOMY NEURO (CUSTOM PROCEDURE TRAY) ×3 IMPLANT
PAD ARMBOARD 7.5X6 YLW CONV (MISCELLANEOUS) ×9 IMPLANT
RASP 3.0MM (RASP) ×1 IMPLANT
SPIKE FLUID TRANSFER (MISCELLANEOUS) ×3 IMPLANT
SPONGE SURGIFOAM ABS GEL 100 (HEMOSTASIS) IMPLANT
SPONGE T-LAP 4X18 ~~LOC~~+RFID (SPONGE) IMPLANT
STRIP CLOSURE SKIN 1/2X4 (GAUZE/BANDAGES/DRESSINGS) IMPLANT
SUT MNCRL AB 3-0 PS2 18 (SUTURE) ×3 IMPLANT
SUT VIC AB 0 CT1 18XCR BRD8 (SUTURE) ×6 IMPLANT
SUT VIC AB 0 CT1 8-18 (SUTURE) ×8
SUT VIC AB 2-0 CP2 18 (SUTURE) ×5 IMPLANT
SUT VIC AB 3-0 SH 8-18 (SUTURE) ×1 IMPLANT
SYR 3ML LL SCALE MARK (SYRINGE) IMPLANT
TOWEL GREEN STERILE (TOWEL DISPOSABLE) ×3 IMPLANT
TOWEL GREEN STERILE FF (TOWEL DISPOSABLE) ×3 IMPLANT
TRAY FOLEY MTR SLVR 16FR STAT (SET/KITS/TRAYS/PACK) ×3 IMPLANT
WATER STERILE IRR 1000ML POUR (IV SOLUTION) ×3 IMPLANT

## 2022-08-09 NOTE — Anesthesia Postprocedure Evaluation (Signed)
Anesthesia Post Note  Patient: [name]  Procedure(s) Performed: Revision of L1-5 fusion (Back)     Patient location during evaluation: PACU Anesthesia Type: General Level of consciousness: awake and alert Pain management: pain level controlled Vital Signs Assessment: post-procedure vital signs reviewed and stable Respiratory status: spontaneous breathing, nonlabored ventilation and respiratory function stable Cardiovascular status: blood pressure returned to baseline and stable Postop Assessment: no apparent nausea or vomiting Anesthetic complications: no  No notable events documented.  Last Vitals:  Vitals:   08/09/22 1545 08/09/22 1600  BP: 124/80 114/72  Pulse: 76 79  Resp: 12 12  Temp:  36.7 C  SpO2: 96% 96%    Last Pain:  Vitals:   08/09/22 1449  TempSrc:   PainSc: 9                  Shaundra Fullam,W. EDMOND

## 2022-08-09 NOTE — Anesthesia Procedure Notes (Signed)
Procedure Name: Intubation Date/Time: 08/09/2022 12:46 PM  Performed by: Janene Harvey, CRNAPre-anesthesia Checklist: Patient identified, Emergency Drugs available, Suction available and Patient being monitored Patient Re-evaluated:Patient Re-evaluated prior to induction Oxygen Delivery Method: Circle system utilized Preoxygenation: Pre-oxygenation with 100% oxygen Induction Type: IV induction Ventilation: Mask ventilation without difficulty Laryngoscope Size: Mac and 3 Grade View: Grade II Tube type: Oral Number of attempts: 1 Airway Equipment and Method: Stylet and Oral airway Placement Confirmation: ETT inserted through vocal cords under direct vision, positive ETCO2 and breath sounds checked- equal and bilateral Tube secured with: Tape Dental Injury: Teeth and Oropharynx as per pre-operative assessment  Comments: Intubation performed by SRNA under direct supervision of CRNA

## 2022-08-09 NOTE — Progress Notes (Signed)
Patient is from [facility] facility. They gave her carvedilol last night, which she usually takes in the AM. Per Dr. Ola Spurr, do not give this AM. She also received her Ozempic yesterday and Dr. Ola Spurr was made aware.

## 2022-08-09 NOTE — Op Note (Signed)
PATIENT: [name]  DAY OF SURGERY: 08/09/22   PRE-OPERATIVE DIAGNOSIS:  Wound dehiscence with exposed hardware, unstable lumbar spine fracture   POST-OPERATIVE DIAGNOSIS:  Same   PROCEDURE:  Revision of complex lumbar wound, removal of left L1, L2, L3, L4, and L5 lumbar screws and rod, removal of right L1 screw, revision of L1-L5 posterior instrumented fusion   SURGEON:  Surgeon(s) and Role:    Judith Part, MD - Primary    Norm Parcel PA - Assisting   ANESTHESIA: ETGA   BRIEF HISTORY: This is a 67 year old woman who previously had a large construct scoli repair and presented after it fractured many years later due to osteopenia with a 3 column fracture. I instrumented it, which was quite difficult due to her anatomy, with multiple left sided screws violating the canal. The patient was without radiculopathy and did not wish to proceed with hardware revision. She then presented with wound breakdown and exposed screws on the left at L5 and partially at L1. I therefore recommended revision of the wound, removal of that hardware to prevent further breakdown, and revision of the hardware with canal violations. This was discussed with the patient as well as risks, benefits, and alternatives and wished to proceed with surgery. We also discussed again that this is a highly complicated situation due to her large fusion, severe osteopenia, and anatomy.   OPERATIVE DETAIL: The patient was taken to the operating room, anesthesia was induced by the anesthesia team, and the patient was placed on the OR table in the prone position. A formal time out was performed with two patient identifiers and confirmed the operative site. The operative site was marked, hair was clipped with surgical clippers, the area was then prepped and draped in a sterile fashion. The L5 screw was visible on the left and a small portion of the L1 screw was visible after prepping with two areas of sterile appearing  dehiscence at those areas. Her prior midline incision was opened and the hardware was dissected. No clear purulence was seen.   After reviewing the preoperative CT, I thought the best course of action was to remove the bilateral L1 screws and the entire left side of the construct, which was performed. As expected, removal of the L4 screw, but not the others, did cause a CSF leak which was plugged with floseal and the hole was plugged with bone wax and water tight. Given the possibility of CSF tracking along into the other screw holes, I placed bone wax over these as well.   The right sided rod was cut below the L1 screw on the right and that portion of the rod was removed with the L1 screw. The left L1 screw was grossly loose, not surprising given that this was the only true pedicle screw I placed previously, the others felt like they had good purchase. The fracture line was actually visible and appeared to be healing well along the posterior elements. Hemostasis was obtained, the bone was decorticated, rBMP was placed along with Magnafuse bone graft to repeat the fusion.   The wound edges were separated into components for multi-layered closure without tension and came together well. A drain was placed subafascially and tunneled prior to closure. After copious irrigation, the incision was closed in layers after counts were confirmed to be correct x2. The patient was then returned to anesthesia for emergence. No apparent complications at the completion of the procedure.   EBL:  42mL   DRAINS:  Subfascial lumbar drain with JP bulb   SPECIMENS: none   Judith Part, MD 08/09/22 2:25 PM

## 2022-08-09 NOTE — Transfer of Care (Cosign Needed)
Immediate Anesthesia Transfer of Care Note  Patient: [name]  Procedure(s) Performed: Revision of L1-5 fusion (Back)  Patient Location: PACU  Anesthesia Type:General  Level of Consciousness: awake  Airway & Oxygen Therapy: Patient Spontanous Breathing and Patient connected to face mask oxygen  Post-op Assessment: Report given to RN and Post -op Vital signs reviewed and stable  Post vital signs: Reviewed and stable  Last Vitals:  Vitals Value Taken Time  BP 145/87 08/09/22 1449  Temp    Pulse 86 08/09/22 1451  Resp 24 08/09/22 1451  SpO2 100 % 08/09/22 1451  Vitals shown include unvalidated device data.  Last Pain:  Vitals:   08/09/22 1004  TempSrc:   PainSc: 6          Complications: No notable events documented.

## 2022-08-09 NOTE — Plan of Care (Signed)
  Problem: Education: Goal: Ability to verbalize activity precautions or restrictions will improve Outcome: Progressing   Problem: Education: Goal: Knowledge of General Education information will improve Description: Including pain rating scale, medication(s)/side effects and non-pharmacologic comfort measures Outcome: Progressing   Problem: Education: Goal: Ability to verbalize activity precautions or restrictions will improve Outcome: Progressing   Problem: Education: Goal: Knowledge of General Education information will improve Description: Including pain rating scale, medication(s)/side effects and non-pharmacologic comfort measures Outcome: Progressing

## 2022-08-09 NOTE — Anesthesia Preprocedure Evaluation (Addendum)
Anesthesia Evaluation  Patient identified by MRN, date of birth, ID band Patient awake    Reviewed: Allergy & Precautions, H&P , NPO status , Patient's Chart, lab work & pertinent test results, reviewed documented beta blocker date and time   Airway Mallampati: II  TM Distance: >3 FB Neck ROM: Full    Dental no notable dental hx. (+) Teeth Intact, Dental Advisory Given   Pulmonary Current Smoker and Patient abstained from smoking.   Pulmonary exam normal breath sounds clear to auscultation       Cardiovascular hypertension, Pt. on medications and Pt. on home beta blockers  Rhythm:Regular Rate:Normal     Neuro/Psych   Anxiety Depression    negative neurological ROS     GI/Hepatic Neg liver ROS, hiatal hernia,GERD  Medicated,,  Endo/Other  diabetes, Type 2, Oral Hypoglycemic AgentsHypothyroidism    Renal/GU negative Renal ROS  negative genitourinary   Musculoskeletal  (+) Arthritis , Osteoarthritis,    Abdominal   Peds  Hematology  (+) Blood dyscrasia, anemia   Anesthesia Other Findings   Reproductive/Obstetrics negative OB ROS                             Anesthesia Physical Anesthesia Plan  ASA: 3  Anesthesia Plan: General   Post-op Pain Management: Tylenol PO (pre-op)*   Induction: Intravenous  PONV Risk Score and Plan: 2 and 3 and Midazolam, Dexamethasone and Ondansetron  Airway Management Planned: Oral ETT  Additional Equipment:   Intra-op Plan:   Post-operative Plan: Extubation in OR  Informed Consent: I have reviewed the patients History and Physical, chart, labs and discussed the procedure including the risks, benefits and alternatives for the proposed anesthesia with the patient or authorized representative who has indicated his/her understanding and acceptance.     Dental advisory given  Plan Discussed with: CRNA  Anesthesia Plan Comments:        Anesthesia  Quick Evaluation

## 2022-08-09 NOTE — H&P (Signed)
Surgical H&P Update  HPI: 67 y.o. with a history of prior fracture through a large segment scoliotic construct, now with exposure of hardware. No changes in health since they were last seen.   PMHx:  Past Medical History:  Diagnosis Date   Anemia    Anxiety    Arthritis    Depression    GERD (gastroesophageal reflux disease)    Heart murmur    "related to VSD"   High cholesterol    History of blood transfusion    "related to OR" (08/19/2016)   History of hiatal hernia    Hypertension    Hyperthyroidism    Mild cognitive impairment 09/06/2018   Paroxysmal ventricular tachycardia (HCC)    Type II diabetes mellitus (HCC)    UTI (urinary tract infection)    being treated with Keflex   Ventricular septal defect    FamHx:  Family History  Problem Relation Age of Onset   Other Mother        alive   Stroke Father 55       deceased   Dementia Father    Chorea Maternal Grandfather    Dementia Maternal Aunt    Dementia Maternal Aunt    Heart attack Other        multiple uncles have died with myocardial infarction   SocHx:  reports that she has been smoking cigarettes. She has never used smokeless tobacco. She reports that she does not drink alcohol and does not use drugs.  Physical Exam: Strength 5/5 x4 and SILTx4   Assesment/Plan: 67 y.o. woman with 3 column fracture through prior scoli construct s/p instrumentation with exposed hardware, here for revision of hardware and wound. Risks, benefits, and alternatives discussed and the patient would like to continue with surgery.  -OR today -floor post-op  Judith Part, MD 08/09/22 12:05 PM

## 2022-08-10 LAB — CBC
HCT: 20.7 % — ABNORMAL LOW (ref 36.0–46.0)
Hemoglobin: 7.2 g/dL — ABNORMAL LOW (ref 12.0–15.0)
MCH: 32.3 pg (ref 26.0–34.0)
MCHC: 34.8 g/dL (ref 30.0–36.0)
MCV: 92.8 fL (ref 80.0–100.0)
Platelets: 247 10*3/uL (ref 150–400)
RBC: 2.23 MIL/uL — ABNORMAL LOW (ref 3.87–5.11)
RDW: 15.8 % — ABNORMAL HIGH (ref 11.5–15.5)
WBC: 7.1 10*3/uL (ref 4.0–10.5)
nRBC: 0 % (ref 0.0–0.2)

## 2022-08-10 MED ORDER — POTASSIUM CHLORIDE CRYS ER 20 MEQ PO TBCR
40.0000 meq | EXTENDED_RELEASE_TABLET | Freq: Once | ORAL | Status: AC
  Administered 2022-08-10: 40 meq via ORAL
  Filled 2022-08-10: qty 2

## 2022-08-10 MED ORDER — TAMSULOSIN HCL 0.4 MG PO CAPS
0.4000 mg | ORAL_CAPSULE | Freq: Every day | ORAL | Status: DC
  Administered 2022-08-10 – 2022-08-18 (×9): 0.4 mg via ORAL
  Filled 2022-08-10 (×9): qty 1

## 2022-08-10 MED ORDER — SODIUM CHLORIDE 0.9 % IV BOLUS
500.0000 mL | Freq: Once | INTRAVENOUS | Status: AC
  Administered 2022-08-10: 500 mL via INTRAVENOUS

## 2022-08-10 MED ORDER — DOXYCYCLINE HYCLATE 100 MG PO TABS
100.0000 mg | ORAL_TABLET | Freq: Two times a day (BID) | ORAL | Status: DC
  Administered 2022-08-10 – 2022-08-18 (×17): 100 mg via ORAL
  Filled 2022-08-10 (×17): qty 1

## 2022-08-10 MED ORDER — CYCLOBENZAPRINE HCL 5 MG PO TABS
5.0000 mg | ORAL_TABLET | Freq: Three times a day (TID) | ORAL | Status: DC | PRN
  Administered 2022-08-11: 5 mg via ORAL
  Filled 2022-08-10: qty 1

## 2022-08-10 NOTE — Evaluation (Signed)
Occupational Therapy Evaluation Patient Details Name: [name] MRN: [id number] DOB: 12/09/1955 Today's Date: 08/10/2022   History of Present Illness Patient is a 67 y/o female who presents on 1/22 for revision of complex lumbar wound, removal of left L1-L5 lumbar screws and rod, removal of right L1 screw and revision of L1-L5 posterior instrumented fusion. PMH includes DM, HTN, anxiety, left TSA, arthritis.   Clinical Impression   Pt in bed upon therapy arrival with husband present in room. Pt was previously at [facility] prior to this admit receiving therapy services for previous admission. Pt reports that therapy was going slowly related to functional transfers and gait. Currently, pt is presenting with decreased BUE strength, sitting balance, core strength, endurance, and activity tolerance as well as surgical pain and discomfort with any movement and mobility requiring increased physical assist to complete BADL tasks. Functional transfers not assessed this date due to increased physical assist required to maintain sitting balance while seated EOB. VC were provided along with visual cues and manual assist to correct sitting posture. Pt was able to maintain for a short amount of time although fatigued quickly. Recommend pt return to SNF at discharge to continue with skilled OT services focusing on mentioned deficits. Acute OT will continue to follow patient.     Recommendations for follow up therapy are one component of a multi-disciplinary discharge planning process, led by the attending physician.  Recommendations may be updated based on patient status, additional functional criteria and insurance authorization.   Follow Up Recommendations  Skilled nursing-short term rehab (<3 hours/day)     Assistance Recommended at Discharge Intermittent Supervision/Assistance  Patient can return home with the following Two people to help with walking and/or transfers;Two people to help with  bathing/dressing/bathroom;Assist for transportation;Direct supervision/assist for medications management    Functional Status Assessment  Patient has had a recent decline in their functional status and demonstrates the ability to make significant improvements in function in a reasonable and predictable amount of time.  Equipment Recommendations  Other (comment) (defer to next venue of care)       Precautions / Restrictions Precautions Precautions: Back;Fall Precaution Booklet Issued: No Precaution Comments: JP drain intact Restrictions Weight Bearing Restrictions: No      Mobility Bed Mobility Overal bed mobility: Needs Assistance Bed Mobility: Rolling, Sidelying to Sit, Sit to Sidelying Rolling: Max assist (chuc pad used to assist) Sidelying to sit: Max assist, +2 for physical assistance, HOB elevated     Sit to sidelying: Max assist, +2 for physical assistance General bed mobility comments: Step by step cues for log roll technique, assist with LEs and to elevate trunk as well as scoot bottom to EOB. Assist to bring LEs back into bed and lower trunk.    Transfers Overall transfer level:  (NT due to safety concern. Unable to sit up on EOB safely)        Balance Overall balance assessment: Needs assistance Sitting-balance support: Feet supported, Bilateral upper extremity supported Sitting balance-Leahy Scale: Zero Sitting balance - Comments: Pt with heavy right lateral lean, needing Mod=Max A for upright balance. Worked on trying to find midline and sit unsupported reaching for rail on left side and therapist in front of patient however pt fearful of falling. Postural control: Posterior lean, Right lateral lean   Standing balance-Leahy Scale:  (NT)           ADL either performed or assessed with clinical judgement   ADL Overall ADL's : Needs assistance/impaired Eating/Feeding: Set up;Bed  level Eating/Feeding Details (indicate cue type and reason): Declined proper  set-up of bed for adequate positioning to eat. Only acceptable of the HOB at ~30-40 degrees. Grooming: Wash/dry hands;Wash/dry face;Oral care;Brushing hair;Sitting;Minimal assistance   Upper Body Bathing: Minimal assistance;Sitting   Lower Body Bathing: Total assistance;+2 for physical assistance;Bed level   Upper Body Dressing : Minimal assistance;Sitting   Lower Body Dressing: Total assistance;+2 for physical assistance;Bed level   Toilet Transfer:  (not safet to test this date)   Toileting- Water quality scientist and Hygiene: Total assistance;+2 for physical assistance;Bed level               Vision Baseline Vision/History: 1 Wears glasses Ability to See in Adequate Light: 0 Adequate Patient Visual Report: No change from baseline Vision Assessment?: No apparent visual deficits            Pertinent Vitals/Pain Pain Assessment Pain Assessment: Faces Faces Pain Scale: Hurts even more Pain Location: back Pain Descriptors / Indicators: Sore, Operative site guarding Pain Intervention(s): Limited activity within patient's tolerance, Monitored during session, Relaxation, Repositioned     Hand Dominance Right   Extremity/Trunk Assessment Upper Extremity Assessment Upper Extremity Assessment: Generalized weakness   Lower Extremity Assessment Lower Extremity Assessment: Defer to PT evaluation RLE Deficits / Details: Overall weakness, difficulty performing LAQ in seated position against gravity, grossly ~2/5 knee ex/knee flexion. LLE Deficits / Details: Overall weakness, difficulty performing LAQ in seated position against gravity, able to activtae quads minimally.   Cervical / Trunk Assessment Cervical / Trunk Assessment: Back Surgery Cervical / Trunk Exceptions: leans right   Communication Communication Communication: No difficulties   Cognition Arousal/Alertness: Awake/alert Behavior During Therapy: Flat affect Overall Cognitive Status: Impaired/Different from  baseline Area of Impairment: Orientation, Attention, Memory, Following commands, Safety/judgement, Problem solving     Orientation Level: Disoriented to, Time   Memory: Decreased short-term memory, Decreased recall of precautions Following Commands: Follows one step commands with increased time, Follows one step commands inconsistently Safety/Judgement: Decreased awareness of safety, Decreased awareness of deficits   Problem Solving: Slow processing, Difficulty sequencing, Decreased initiation, Requires verbal cues, Requires tactile cues General Comments: Fearful of falling during mobility, states it is "Feb 2004" Slow processing/response time.     General Comments  Bandage- clean dry and intact.            Home Living Family/patient expects to be discharged to:: [place]: Other (Comment) (plan to return to [facility] to receive continued rehab)             Prior Functioning/Environment Prior Level of Function : Needs assist  Cognitive Assist : Mobility (cognitive);ADLs (cognitive) Mobility (Cognitive): Intermittent cues ADLs (Cognitive): Intermittent cues Physical Assist : Mobility (physical);ADLs (physical) Mobility (physical): Bed mobility;Transfers ADLs (physical): Grooming;Bathing;Dressing;IADLs;Toileting;Feeding Mobility Comments: Back to SNF likely, reports doing minimal ambulation with RW and standing. ADLs Comments: needed assist with all BADL tasks        OT Problem List: Decreased strength;Decreased safety awareness;Obesity;Decreased activity tolerance;Impaired balance (sitting and/or standing);Impaired UE functional use;Pain;Decreased coordination      OT Treatment/Interventions: Self-care/ADL training;Therapeutic activities;Therapeutic exercise;Neuromuscular education;DME and/or AE instruction;Manual therapy;Modalities;Balance training;Patient/family education;Energy conservation    OT Goals(Current goals can be found  in the care plan section) Acute Rehab OT Goals Patient Stated Goal: none stated OT Goal Formulation: With patient Time For Goal Achievement: 08/24/22 Potential to Achieve Goals: Fair  OT Frequency: Min 2X/week    Co-evaluation PT/OT/SLP Co-Evaluation/Treatment: Yes Reason for Co-Treatment: Complexity of the patient's impairments (multi-system  involvement);To address functional/ADL transfers;For patient/therapist safety   OT goals addressed during session: ADL's and self-care;Strengthening/ROM      AM-PAC OT "6 Clicks" Daily Activity     Outcome Measure Help from another person eating meals?: A Lot Help from another person taking care of personal grooming?: A Lot Help from another person toileting, which includes using toliet, bedpan, or urinal?: Total Help from another person bathing (including washing, rinsing, drying)?: Total Help from another person to put on and taking off regular upper body clothing?: A Little Help from another person to put on and taking off regular lower body clothing?: Total 6 Click Score: 10   End of Session    Activity Tolerance: Patient tolerated treatment well;Patient limited by pain;Patient limited by fatigue Patient left: in bed;with call bell/phone within reach;with bed alarm set  OT Visit Diagnosis: Muscle weakness (generalized) (M62.81);Repeated falls (R29.6)                Time: 0347-4259 OT Time Calculation (min): 26 min Charges:  OT General Charges $OT Visit: 1 Visit OT Evaluation $OT Eval High Complexity: 1 High  [employer], OTR/L,CBIS  Supplemental OT - [facility] and [facility] Secure Chat Preferred    Abrea Henle, Charisse March 08/10/2022, 11:54 AM

## 2022-08-10 NOTE — Progress Notes (Signed)
Pt has not pee'd since her foley came out, her bladder scan showed 346ml. Ebony Hail, [place] messaged and notified so waiting for further instructions

## 2022-08-10 NOTE — Evaluation (Signed)
Physical Therapy Evaluation Patient Details Name: [name] MRN: [id number] DOB: 1955/09/30 Today's Date: 08/10/2022  History of Present Illness  Patient is a 67 y/o female who presents on 1/22 for revision of complex lumbar wound, removal of left L1-L5 lumbar screws and rod, removal of right L1 screw and revision of L1-L5 posterior instrumented fusion. PMH includes DM, HTN, anxiety, left TSA, arthritis.  Clinical Impression  Patient presents with pain, generalized weakness, impaired cognition, anxiety, impaired balance, post surgical deficits and impaired mobility s/p above. Pt is from [facility] and reports walking short distances with RW PTA and needs assist with ADLs. Today, pt requires Max A of 2 for bed mobility and Mod-Max A for sitting balance due to heavy posterior and right lateral lean despite BUE support and working on finding midline. Pt fearful of falling. Deferred standing due to poor sitting balance. Reviewed log roll technique. Would benefit from return to SNF to maximize independence and mobility prior to return home. Will follow acutely.       Recommendations for follow up therapy are one component of a multi-disciplinary discharge planning process, led by the attending physician.  Recommendations may be updated based on patient status, additional functional criteria and insurance authorization.  Follow Up Recommendations Skilled nursing-short term rehab (<3 hours/day) Can patient physically be transported by private vehicle: No    Assistance Recommended at Discharge Frequent or constant Supervision/Assistance  Patient can return home with the following  Two people to help with walking and/or transfers;A lot of help with bathing/dressing/bathroom;Assist for transportation    Equipment Recommendations None recommended by PT  Recommendations for Other Services       Functional Status Assessment Patient has had a recent decline in their functional status  and/or demonstrates limited ability to make significant improvements in function in a reasonable and predictable amount of time     Precautions / Restrictions Precautions Precautions: Back;Fall Precaution Booklet Issued: No Precaution Comments: JP drain intact Restrictions Weight Bearing Restrictions: No      Mobility  Bed Mobility Overal bed mobility: Needs Assistance Bed Mobility: Rolling, Sidelying to Sit, Sit to Sidelying Rolling: Max assist Sidelying to sit: Max assist, +2 for physical assistance, HOB elevated     Sit to sidelying: Max assist, +2 for physical assistance General bed mobility comments: Step by step cues for log roll technique, assist with LEs and to elevate trunk as well as scoot bottom to EOB. Assist to bring LEs back into bed and lower trunk.    Transfers                   General transfer comment: Unable due to poor sitting balance.    Ambulation/Gait                  Stairs            Wheelchair Mobility    Modified Rankin (Stroke Patients Only)       Balance Overall balance assessment: Needs assistance Sitting-balance support: Feet supported, Bilateral upper extremity supported Sitting balance-Leahy Scale: Poor Sitting balance - Comments: Pt with heavy right lateral lean, needing Mod=Max A for upright balance. Worked on trying to find midline and sit unsupported reaching for rail on left side and therapist in front of patient however pt fearful of falling. Postural control: Posterior lean, Right lateral lean  Pertinent Vitals/Pain Pain Assessment Pain Assessment: Faces Faces Pain Scale: Hurts even more Pain Location: back Pain Descriptors / Indicators: Sore, Operative site guarding Pain Intervention(s): Monitored during session, Repositioned, Premedicated before session, Limited activity within patient's tolerance    Home Living Family/patient expects to be discharged  to:: Skilled nursing facility                        Prior Function Prior Level of Function : Needs assist             Mobility Comments: Back to SNF likely, reports doing minimal ambulation with RW and standing. ADLs Comments: assist needed.     Hand Dominance   Dominant Hand: Right    Extremity/Trunk Assessment   Upper Extremity Assessment Upper Extremity Assessment: Defer to OT evaluation    Lower Extremity Assessment Lower Extremity Assessment: RLE deficits/detail;LLE deficits/detail RLE Deficits / Details: Overall weakness, difficulty performing LAQ in seated position against gravity, grossly ~2/5 knee ex/knee flexion. LLE Deficits / Details: Overall weakness, difficulty performing LAQ in seated position against gravity, able to activtae quads minimally.    Cervical / Trunk Assessment Cervical / Trunk Assessment: Back Surgery;Other exceptions Cervical / Trunk Exceptions: leans right  Communication   Communication: No difficulties  Cognition Arousal/Alertness: Awake/alert Behavior During Therapy: WFL for tasks assessed/performed Overall Cognitive Status: Impaired/Different from baseline Area of Impairment: Orientation, Attention, Memory, Following commands, Safety/judgement, Problem solving                 Orientation Level: Disoriented to, Time Current Attention Level: Sustained Memory: Decreased short-term memory, Decreased recall of precautions Following Commands: Follows one step commands consistently, Follows one step commands with increased time Safety/Judgement: Decreased awareness of safety, Decreased awareness of deficits   Problem Solving: Slow processing, Difficulty sequencing, Decreased initiation, Requires verbal cues, Requires tactile cues General Comments: Fearful of falling during mobility, states it is "Feb 2004" Slow processing/response time.        General Comments General comments (skin integrity, edema, etc.): Bandage- clean  dry and intact.    Exercises     Assessment/Plan    PT Assessment Patient needs continued PT services  PT Problem List Decreased strength;Decreased mobility;Decreased safety awareness;Pain;Decreased balance;Decreased cognition;Decreased skin integrity;Decreased knowledge of precautions;Decreased range of motion       PT Treatment Interventions Therapeutic activities;Therapeutic exercise;Balance training;Patient/family education;Cognitive remediation;Gait training;Functional mobility training;Neuromuscular re-education;Wheelchair mobility training    PT Goals (Current goals can be found in the Care Plan section)  Acute Rehab PT Goals Patient Stated Goal: decrease pain PT Goal Formulation: With patient Time For Goal Achievement: 08/24/22 Potential to Achieve Goals: Fair    Frequency Min 4X/week     Co-evaluation               AM-PAC PT "6 Clicks" Mobility  Outcome Measure Help needed turning from your back to your side while in a flat bed without using bedrails?: A Lot Help needed moving from lying on your back to sitting on the side of a flat bed without using bedrails?: Total Help needed moving to and from a bed to a chair (including a wheelchair)?: Total Help needed standing up from a chair using your arms (e.g., wheelchair or bedside chair)?: Total Help needed to walk in hospital room?: Total Help needed climbing 3-5 steps with a railing? : Total 6 Click Score: 7    End of Session   Activity Tolerance: Patient limited by pain;Other (comment) (fear of falling) Patient  left: in bed;with call bell/phone within reach;with SCD's reapplied;with bed alarm set Nurse Communication: Mobility status PT Visit Diagnosis: Pain;Muscle weakness (generalized) (M62.81) Pain - part of body:  (back)    Time: 6283-6629 PT Time Calculation (min) (ACUTE ONLY): 25 min   Charges:   PT Evaluation $PT Eval Moderate Complexity: 1 Mod          Marisa Severin, PT, DPT Acute  Rehabilitation Services Secure chat preferred Office [phone]     Marguarite Arbour A Liadan Guizar 08/10/2022, 11:01 AM

## 2022-08-10 NOTE — Progress Notes (Signed)
Neurosurgery Service Progress Note  Subjective: No acute events overnight. Back pain as expected for procedure. No voiced complaints.    Objective: Vitals:   08/10/22 0035 08/10/22 0657 08/10/22 0904 08/10/22 1032  BP: (!) 98/57 (!) 84/53 (!) 97/56 97/61  Pulse: 99 (!) 105 92 91  Resp: 16 16 17 17   Temp: 97.6 F (36.4 C) 99.1 F (37.3 C) 98.3 F (36.8 C) 98.3 F (36.8 C)  TempSrc: Oral Oral Oral Oral  SpO2: 98% 97% 98% 98%  Weight:      Height:        Physical Exam: Strength 5/5 x4 and SILTx4, incision c/d/I   Assessment & Plan: 67 y.o. female s/p revision of complex lumbar wound, removal of left L1-L5 lumbar screws and rod, removal of right L1 screw, revision of L1-L5 posterior instrumented fusion, recovering well.  -PT/OT recs  -Foley catheter d/c's this AM  -Continue current PO pain medications  -Anticipate discharge back to SNF that she was residing at prior to surgery tomorrow -SCDs / TEDs  -SQH POD #2   Norm Parcel, PA-C 08/10/22 10:38 AM

## 2022-08-10 NOTE — Progress Notes (Signed)
PA gave order for in and out cath, RN got 451ml out of patients bladder.

## 2022-08-10 NOTE — Progress Notes (Signed)
   08/10/22 0657  Assess: MEWS Score  Temp 99.1 F (37.3 C)  BP (!) 84/53  MAP (mmHg) (!) 63  Pulse Rate (!) 105  Resp 16  SpO2 97 %  O2 Device Room Air  Assess: MEWS Score  MEWS Temp 0  MEWS Systolic 1  MEWS Pulse 1  MEWS RR 0  MEWS LOC 0  MEWS Score 2  MEWS Score Color Yellow  Assess: if the MEWS score is Yellow or Red  Were vital signs taken at a resting state? Yes  Focused Assessment No change from prior assessment  Does the patient meet 2 or more of the SIRS criteria? No  MEWS guidelines implemented *See Row Information* Yes  Take Vital Signs  Increase Vital Sign Frequency  Yellow: Q 2hr X 2 then Q 4hr X 2, if remains yellow, continue Q 4hrs  Notify: Charge Nurse/RN  Name of Charge Nurse/RN Notified [place], [place]  Date Charge Nurse/RN Notified 08/10/22  Time Charge Nurse/RN Notified [phone]  Provider Notification  Provider Name/Title Dr. Reinaldo Meeker  Date Provider Notified 08/10/22  Time Provider Notified [phone]  Method of Notification Page  Notification Reason  (low BP)  Provider response See new orders  Date of Provider Response 08/10/22  Time of Provider Response 0720  Assess: SIRS CRITERIA  SIRS Temperature  0  SIRS Pulse 1  SIRS Respirations  0  SIRS WBC 0  SIRS Score Sum  1

## 2022-08-11 ENCOUNTER — Inpatient Hospital Stay (HOSPITAL_COMMUNITY): Payer: Medicare Other

## 2022-08-11 MED ORDER — SODIUM CHLORIDE 0.9 % IV BOLUS: 500.0000 mL | Freq: Once | INTRAVENOUS | Status: DC

## 2022-08-11 MED ORDER — HEPARIN SODIUM (PORCINE) 5000 UNIT/ML IJ SOLN
5000.0000 [IU] | Freq: Three times a day (TID) | INTRAMUSCULAR | Status: DC
  Administered 2022-08-11 – 2022-08-24 (×39): 5000 [IU] via SUBCUTANEOUS
  Filled 2022-08-11 (×39): qty 1

## 2022-08-11 NOTE — Progress Notes (Signed)
K+ was recently replaced. Ok to check bmet in AM per [employer].  Onnie Boer, PharmD, BCIDP, AAHIVP, CPP Infectious Disease Pharmacist 08/11/2022 1:37 PM

## 2022-08-11 NOTE — Progress Notes (Signed)
Lower extremity venous bilateral attempted. Patient receiving RN care. Will attempt again as schedule and patient availability permits.   Darlin Coco, RDMS, RVT

## 2022-08-11 NOTE — TOC Progression Note (Signed)
Transition of Care [facility]) - Progression Note    Patient Details  Name: [name] MRN: [id number] Date of Birth: Dec 10, 1955  Transition of Care [facility]) CM/SW [place], [place] Phone Number: 08/11/2022, 9:43 AM  Clinical Narrative:     CSW contact [place] SNF liaison and confirmed pt is from there for short term rehab and can return at [place] pending [facility] approval.        Expected Discharge Plan and Services      SNF                                         Social Determinants of Health (SDOH) Interventions SDOH Screenings   Food Insecurity: No Food Insecurity (06/27/2022)  Housing: Low Risk  (06/27/2022)  Transportation Needs: No Transportation Needs (06/27/2022)  Utilities: Not At Risk (06/27/2022)  Tobacco Use: High Risk (08/09/2022)    Readmission Risk Interventions    07/14/2022    2:53 PM  Readmission Risk Prevention Plan  Transportation Screening Complete  PCP or Specialist Appt within 5-7 Days Complete  Home Care Screening Complete  Medication Review (RN CM) Complete

## 2022-08-11 NOTE — Progress Notes (Signed)
PT Cancellation Note  Patient Details Name: [name] MRN: [id number] DOB: January 17, 1956   Cancelled Treatment:    Reason Eval/Treat Not Completed: Patient declined, no reason specified.  I'm not doing that today. 08/11/2022  Ginger Carne., PT Acute Rehabilitation Services [phone]  (office)   Tessie Fass Giles Currie 08/11/2022, 5:27 PM

## 2022-08-11 NOTE — TOC Initial Note (Signed)
Transition of Care [facility]) - Initial/Assessment Note    Patient Details  Name: [name] MRN: [id number] Date of Birth: [birth year]  Transition [facility] Care [facility]) CM/SW Contact:    Coralee Pesa, [place] Phone Number: 08/11/2022, 2:23 PM  Clinical Narrative:                 CSW met with pt at bedside to discuss SNF recommendation. Pt confirms she is from [place] and is agreeable to returning. Pt states she will update spouse. Pt has no other questions or concerns. CSW confirmed with [facility] they can take pt when ready, as long as they have a bed. Pt will need an authorization prior to DC. TOC will continue to follow for further needs.   Expected Discharge Plan: Skilled Nursing Facility Barriers to Discharge: Continued Medical Work up, Ship broker   Patient Goals and CMS Choice Patient states their goals for this hospitalization and ongoing recovery are:: Pt would like to regain some independence. CMS Medicare.gov Compare Post Acute Care list provided to:: Patient Choice offered to / list presented to : Patient      Expected Discharge Plan and Services     Post Acute Care Choice: [place] Living arrangements for the past 2 months: Single Family Home                                      Prior Living Arrangements/Services Living arrangements for the past 2 months: Single Family Home Lives with:: Spouse Patient language and need for interpreter reviewed:: Yes Do you feel safe going back to the place where you live?: Yes      Need for Family Participation in Patient Care: Yes (Comment) Care giver support system in place?: Yes (comment) Current home services: DME Criminal Activity/Legal Involvement Pertinent to Current Situation/Hospitalization: No - Comment as needed  Activities of Daily Living Home Assistive Devices/Equipment: Wheelchair, Shower chair with back ADL Screening (condition at time of admission) Patient's cognitive ability  adequate to safely complete daily activities?: Yes Is the patient deaf or have difficulty hearing?: No Does the patient have difficulty seeing, even when wearing glasses/contacts?: No Does the patient have difficulty concentrating, remembering, or making decisions?: No Patient able to express need for assistance with ADLs?: Yes Does the patient have difficulty dressing or bathing?: Yes Independently performs ADLs?: No Communication: Independent Dressing (OT): Needs assistance Is this a change from baseline?: Pre-admission baseline Grooming: Independent Feeding: Independent Bathing: Needs assistance Is this a change from baseline?: Pre-admission baseline Toileting: Needs assistance Is this a change from baseline?: Pre-admission baseline In/Out Bed: Needs assistance Is this a change from baseline?: Pre-admission baseline Walks in Home: Needs assistance Is this a change from baseline?: Pre-admission baseline Does the patient have difficulty walking or climbing stairs?: Yes Weakness of Legs: Both Weakness of Arms/Hands: None  Permission Sought/Granted Permission sought to share information with : Family Supports Permission granted to share information with : No              Emotional Assessment Appearance:: Appears stated age Attitude/Demeanor/Rapport: Engaged Affect (typically observed): Appropriate Orientation: : Oriented to Self, Oriented to Place, Oriented to  Time, Oriented to Situation Alcohol / Substance Use: Not Applicable Psych Involvement: No (comment)  Admission diagnosis:  Wound dehiscence [T81.30XA] Patient Active Problem List   Diagnosis Date Noted   Wound dehiscence 08/09/2022   Delayed surgical wound healing 07/08/2022  Fracture of lumbar spine without cord injury ([place]) 06/26/2022   Lumbar vertebral fracture ([place]) 06/25/2022   Mood disorder ([place]) 09/29/2021   Mild cognitive impairment 09/06/2018   Hyponatremia 02/28/2018   S/P shoulder replacement, left  08/19/2016   Anxiety 10/17/2014   Acid reflux 10/17/2014   BP (high blood pressure) 10/17/2014   Arthritis, degenerative 10/17/2014   Adult hypothyroidism 10/17/2014   UTI (urinary tract infection) 06/03/2014   Fracture of bone adjacent to prosthesis 06/03/2014   Diabetes ([place]) 06/02/2014   Peri-prosthetic fracture of femur following total hip arthroplasty 06/02/2014   Bladder retention 05/13/2014   Chronic pain 05/10/2014   History of hip surgery 05/10/2014   UNSPECIFIED HEART FAILURE 06/24/2010   UNSPECIFIED CONGENITAL DEFECT OF SEPTAL CLOSURE 06/24/2010   TOBACCO ABUSE 03/18/2010   Secondary cardiomyopathy ([place]) 03/18/2010   DM 06/13/2009   HYPERTENSION, UNSPECIFIED 06/13/2009   VENTRICULAR TACHYCARDIA 06/13/2009   VENTRICULAR SEPTAL DEFECT, CONGENITAL 06/13/2009   Diabetes mellitus, type 2 ([place]) 06/13/2009   Essential (primary) hypertension 06/13/2009   PCP:  [name], MD Pharmacy:   [facility] [place], [place] - [address] [place] [address] [place] [address] Phone: [phone] Fax: [phone]     Social Determinants of Health (SDOH) Social History: SDOH Screenings   Food Insecurity: No Food Insecurity (06/27/2022)  Housing: Low Risk  (06/27/2022)  Transportation Needs: No Transportation Needs (06/27/2022)  Utilities: Not At Risk (06/27/2022)  Tobacco Use: High Risk (08/09/2022)   SDOH Interventions:     Readmission Risk Interventions    07/14/2022    2:53 PM  Readmission Risk Prevention Plan  Transportation Screening Complete  PCP or Specialist Appt within 5-7 Days Complete  Home Care Screening Complete  Medication Review (RN CM) Complete

## 2022-08-11 NOTE — Plan of Care (Signed)

## 2022-08-11 NOTE — NC FL2 (Signed)
[place] LEVEL OF CARE FORM     IDENTIFICATION  Patient Name: [name] Birthdate: [birth year] Sex: female Admission Date (Current Location): 08/09/2022  [facility] [place] Number:  Herbalist and Address:  [facility] [place] [address] [place], [place] [address]      Provider Number: [id number]  Attending Physician Name and Address:  Judith Part, MD  Relative Name and Phone Number:  Catie Chiao  [phone]    Current Level of Care: Hospital Recommended Level of Care: [place] Prior Approval Number:    Date Approved/Denied:   PASRR Number: [id number]  Discharge Plan: SNF    Current Diagnoses: Patient Active Problem List   Diagnosis Date Noted   Wound dehiscence 08/09/2022   Delayed surgical wound healing 07/08/2022   Fracture of lumbar spine without cord injury ([place]) 06/26/2022   Lumbar vertebral fracture ([place]) 06/25/2022   Mood disorder ([place]) 09/29/2021   Mild cognitive impairment 09/06/2018   Hyponatremia 02/28/2018   S/P shoulder replacement, left 08/19/2016   Anxiety 10/17/2014   Acid reflux 10/17/2014   BP (high blood pressure) 10/17/2014   Arthritis, degenerative 10/17/2014   Adult hypothyroidism 10/17/2014   UTI (urinary tract infection) 06/03/2014   Fracture of bone adjacent to prosthesis 06/03/2014   Diabetes ([place]) 06/02/2014   Peri-prosthetic fracture of femur following total hip arthroplasty 06/02/2014   Bladder retention 05/13/2014   Chronic pain 05/10/2014   History of hip surgery 05/10/2014   UNSPECIFIED HEART FAILURE 06/24/2010   UNSPECIFIED CONGENITAL DEFECT OF SEPTAL CLOSURE 06/24/2010   TOBACCO ABUSE 03/18/2010   Secondary cardiomyopathy ([place]) 03/18/2010   DM 06/13/2009   HYPERTENSION, UNSPECIFIED 06/13/2009   VENTRICULAR TACHYCARDIA 06/13/2009   VENTRICULAR SEPTAL DEFECT, CONGENITAL 06/13/2009   Diabetes mellitus, type 2 ([place]) 06/13/2009   Essential  (primary) hypertension 06/13/2009    Orientation RESPIRATION BLADDER Height & Weight     Self, Time, Situation, Place  Normal Indwelling catheter Weight: 149 lb (67.6 kg) Height:  5' (152.4 cm)  BEHAVIORAL SYMPTOMS/MOOD NEUROLOGICAL BOWEL NUTRITION STATUS      Continent Diet (See DC summary)  AMBULATORY STATUS COMMUNICATION OF NEEDS Skin   Extensive Assist Verbally Surgical wounds (Back Incision)                       Personal Care Assistance Level of Assistance  Bathing, Feeding, Dressing Bathing Assistance: Maximum assistance Feeding assistance: Limited assistance Dressing Assistance: Maximum assistance     Functional Limitations Info  Sight, Hearing, Speech Sight Info: Adequate Hearing Info: Adequate Speech Info: Adequate    SPECIAL CARE FACTORS FREQUENCY                       Contractures Contractures Info: Not present    Additional Factors Info  Code Status, Allergies, Psychotropic Code Status Info: Full Allergies Info: NKA Psychotropic Info: Bupropion, donepezil, Quetiapine         Current Medications (08/11/2022):  This is the current hospital active medication list Current Facility-Administered Medications  Medication Dose Route Frequency Provider Last Rate Last Admin   0.9 %  sodium chloride infusion  250 mL Intravenous Continuous Judith Part, MD       acetaminophen (TYLENOL) tablet 650 mg  650 mg Oral Q4H PRN Judith Part, MD       Or   acetaminophen (TYLENOL) suppository 650 mg  650 mg Rectal Q4H PRN Judith Part, MD  amLODipine (NORVASC) tablet 5 mg  5 mg Oral q AM Jadene Pierini, MD       buPROPion (WELLBUTRIN XL) 24 hr tablet 300 mg  300 mg Oral q AM Jadene Pierini, MD   300 mg at 08/11/22 0646   carvedilol (COREG) tablet 6.25 mg  6.25 mg Oral BID WC Jadene Pierini, MD   6.25 mg at 08/10/22 0809   cyclobenzaprine (FLEXERIL) tablet 5 mg  5 mg Oral TID Jadene Pierini, MD   5 mg at 08/10/22 1956    cyclobenzaprine (FLEXERIL) tablet 5 mg  5 mg Oral TID PRN Jadene Pierini, MD       docusate sodium (COLACE) capsule 100 mg  100 mg Oral BID Jadene Pierini, MD   100 mg at 08/11/22 1011   donepezil (ARICEPT) tablet 10 mg  10 mg Oral Daily Jadene Pierini, MD   10 mg at 08/11/22 1012   doxycycline (VIBRA-TABS) tablet 100 mg  100 mg Oral Q12H Jadene Pierini, MD   100 mg at 08/11/22 1011   fluvoxaMINE (LUVOX) tablet 100 mg  100 mg Oral QHS Jadene Pierini, MD   100 mg at 08/10/22 2208   gabapentin (NEURONTIN) capsule 200 mg  200 mg Oral TID Jadene Pierini, MD   200 mg at 08/11/22 1451   heparin injection 5,000 Units  5,000 Units Subcutaneous Q8H [place] Sizer, PA-C   5,000 Units at 08/11/22 1451   HYDROmorphone (DILAUDID) injection 1 mg  1 mg Intravenous Q3H PRN Jadene Pierini, MD   1 mg at 08/10/22 1958   lamoTRIgine (LAMICTAL) tablet 200 mg  200 mg Oral Daily Jadene Pierini, MD   200 mg at 08/11/22 1014   levothyroxine (SYNTHROID) tablet 88 mcg  88 mcg Oral QAC breakfast Jadene Pierini, MD   88 mcg at 08/11/22 0646   losartan (COZAAR) tablet 100 mg  100 mg Oral QHS Jadene Pierini, MD   100 mg at 08/09/22 2036   menthol-cetylpyridinium (CEPACOL) lozenge 3 mg  1 lozenge Oral PRN Jadene Pierini, MD       Or   phenol (CHLORASEPTIC) mouth spray 1 spray  1 spray Mouth/Throat PRN Jadene Pierini, MD       metFORMIN (GLUCOPHAGE) tablet 500 mg  500 mg Oral BID WC Jadene Pierini, MD   500 mg at 08/11/22 1012   multivitamin with minerals tablet 1 tablet  1 tablet Oral Daily Jadene Pierini, MD   1 tablet at 08/11/22 1010   nicotine polacrilex (NICORETTE) gum 2 mg  2 mg Oral Daily Jadene Pierini, MD       ondansetron (ZOFRAN) tablet 4 mg  4 mg Oral Q6H PRN Jadene Pierini, MD       Or   ondansetron (ZOFRAN) injection 4 mg  4 mg Intravenous Q6H PRN Jadene Pierini, MD       oxyCODONE (Oxy IR/ROXICODONE) immediate release  tablet 10 mg  10 mg Oral Q4H PRN Jadene Pierini, MD   10 mg at 08/10/22 0809   oxyCODONE (Oxy IR/ROXICODONE) immediate release tablet 5 mg  5 mg Oral Q4H PRN Jadene Pierini, MD   5 mg at 08/10/22 2209   pantoprazole (PROTONIX) EC tablet 80 mg  80 mg Oral QHS Jadene Pierini, MD   80 mg at 08/10/22 2208   polyethylene glycol (MIRALAX / GLYCOLAX) packet 17 g  17 g Oral Daily PRN  Judith Part, MD       QUEtiapine (SEROQUEL) tablet 600 mg  600 mg Oral QHS Judith Part, MD   600 mg at 08/10/22 2208   sodium chloride 0.9 % bolus 500 mL  500 mL Intravenous Once Bergman, Meghan D, NP       sodium chloride flush (NS) 0.9 % injection 3 mL  3 mL Intravenous Q12H Judith Part, MD   3 mL at 08/11/22 1016   sodium chloride flush (NS) 0.9 % injection 3 mL  3 mL Intravenous PRN Judith Part, MD       sodium chloride tablet 1 g  1 g Oral QODAY Judith Part, MD   1 g at 08/09/22 2218   tamsulosin (FLOMAX) capsule 0.4 mg  0.4 mg Oral Daily Collene Schlichter, PA-C   0.4 mg at 08/11/22 1012     Discharge Medications: Please see discharge summary for a list of discharge medications.  Relevant Imaging Results:  Relevant Lab Results:   Additional Information SS# [id number]  Coralee Pesa, LCSWA

## 2022-08-11 NOTE — Progress Notes (Signed)
Neurosurgery Service Progress Note  Subjective: No acute events overnight. Back pain as expected for procedure. No voiced complaints.    Objective: Vitals:   08/11/22 0326 08/11/22 0407 08/11/22 0600 08/11/22 0748  BP: (!) 89/66 (!) 87/54 106/67 (!) 94/53  Pulse:  93 87 94  Resp:  18 14 16   Temp:  98.2 F (36.8 C) 98.2 F (36.8 C) 98 F (36.7 C)  TempSrc:  Axillary  Oral  SpO2:  97% 96% 98%  Weight:      Height:        Physical Exam: Strength 5/5 x4 and SILTx4, incision c/d/I   Assessment & Plan: 67 y.o. female s/p revision of complex lumbar wound, removal of left L1-L5 lumbar screws and rod, removal of right L1 screw, revision of L1-L5 posterior instrumented fusion, recovering well.   -continue PT/OT  -ordered LE doppler as she has been sedentary and had surgery x2 in 4 weeks and is at an increased risk of DVT  -continue drain, will pull it tomorrow  -SCDs / TEDs / SQH 5000 mg TID  -Anticipate discharge back to SNF that she was residing at prior to surgery tomorrow   Norm Parcel, PA-C 08/11/22 9:23 AM

## 2022-08-12 ENCOUNTER — Inpatient Hospital Stay (HOSPITAL_COMMUNITY): Payer: Medicare Other

## 2022-08-12 DIAGNOSIS — T8130XA Disruption of wound, unspecified, initial encounter: Secondary | ICD-10-CM | POA: Diagnosis not present

## 2022-08-12 LAB — URINALYSIS, ROUTINE W REFLEX MICROSCOPIC
Bilirubin Urine: NEGATIVE
Glucose, UA: NEGATIVE mg/dL
Hgb urine dipstick: NEGATIVE
Ketones, ur: 20 mg/dL — AB
Nitrite: NEGATIVE
Protein, ur: NEGATIVE mg/dL
Specific Gravity, Urine: 1.009 (ref 1.005–1.030)
WBC, UA: >50 WBC/hpf (ref 0–5)
pH: 6 (ref 5.0–8.0)

## 2022-08-12 LAB — CBC
HCT: 24.1 % — ABNORMAL LOW (ref 36.0–46.0)
Hemoglobin: 8.4 g/dL — ABNORMAL LOW (ref 12.0–15.0)
MCH: 31.9 pg (ref 26.0–34.0)
MCHC: 34.9 g/dL (ref 30.0–36.0)
MCV: 91.6 fL (ref 80.0–100.0)
Platelets: 288 10*3/uL (ref 150–400)
RBC: 2.63 MIL/uL — ABNORMAL LOW (ref 3.87–5.11)
RDW: 15.9 % — ABNORMAL HIGH (ref 11.5–15.5)
WBC: 6.7 10*3/uL (ref 4.0–10.5)
nRBC: 0 % (ref 0.0–0.2)

## 2022-08-12 LAB — BASIC METABOLIC PANEL
Anion gap: 10 (ref 5–15)
BUN: <5 mg/dL — ABNORMAL LOW (ref 8–23)
CO2: 22 mmol/L (ref 22–32)
Calcium: 7.5 mg/dL — ABNORMAL LOW (ref 8.9–10.3)
Chloride: 95 mmol/L — ABNORMAL LOW (ref 98–111)
Creatinine, Ser: 0.42 mg/dL — ABNORMAL LOW (ref 0.44–1.00)
GFR, Estimated: >60 mL/min (ref >60)
Glucose, Bld: 72 mg/dL (ref 70–99)
Potassium: 2.9 mmol/L — ABNORMAL LOW (ref 3.5–5.1)
Sodium: 127 mmol/L — ABNORMAL LOW (ref 135–145)

## 2022-08-12 LAB — COMPREHENSIVE METABOLIC PANEL
ALT: 12 U/L (ref 0–44)
AST: 23 U/L (ref 15–41)
Albumin: 2 g/dL — ABNORMAL LOW (ref 3.5–5.0)
Alkaline Phosphatase: 69 U/L (ref 38–126)
Anion gap: 8 (ref 5–15)
BUN: <5 mg/dL — ABNORMAL LOW (ref 8–23)
CO2: 23 mmol/L (ref 22–32)
Calcium: 7.7 mg/dL — ABNORMAL LOW (ref 8.9–10.3)
Chloride: 94 mmol/L — ABNORMAL LOW (ref 98–111)
Creatinine, Ser: 0.36 mg/dL — ABNORMAL LOW (ref 0.44–1.00)
GFR, Estimated: >60 mL/min (ref >60)
Glucose, Bld: 104 mg/dL — ABNORMAL HIGH (ref 70–99)
Potassium: 2.4 mmol/L — CL (ref 3.5–5.1)
Sodium: 125 mmol/L — ABNORMAL LOW (ref 135–145)
Total Bilirubin: 0.4 mg/dL (ref 0.3–1.2)
Total Protein: 5 g/dL — ABNORMAL LOW (ref 6.5–8.1)

## 2022-08-12 MED ORDER — CHLORHEXIDINE GLUCONATE CLOTH 2 % EX PADS
6.0000 | MEDICATED_PAD | Freq: Every day | CUTANEOUS | Status: DC
  Administered 2022-08-13 – 2022-09-03 (×24): 6 via TOPICAL

## 2022-08-12 MED ORDER — POTASSIUM CHLORIDE 10 MEQ/100ML IV SOLN
10.0000 meq | Freq: Once | INTRAVENOUS | Status: AC
  Administered 2022-08-12: 10 meq via INTRAVENOUS
  Filled 2022-08-12: qty 100

## 2022-08-12 MED ORDER — POTASSIUM CHLORIDE 10 MEQ/100ML IV SOLN
10.0000 meq | INTRAVENOUS | Status: DC
  Administered 2022-08-12 (×3): 10 meq via INTRAVENOUS
  Filled 2022-08-12 (×3): qty 100

## 2022-08-12 MED ORDER — SODIUM CHLORIDE 1 G PO TABS
1.0000 g | ORAL_TABLET | Freq: Two times a day (BID) | ORAL | Status: DC
  Administered 2022-08-12 – 2022-08-20 (×15): 1 g via ORAL
  Filled 2022-08-12 (×16): qty 1

## 2022-08-12 MED ORDER — CIPROFLOXACIN HCL 500 MG PO TABS
500.0000 mg | ORAL_TABLET | Freq: Two times a day (BID) | ORAL | Status: DC
  Administered 2022-08-12 – 2022-08-13 (×2): 500 mg via ORAL
  Filled 2022-08-12 (×2): qty 1

## 2022-08-12 MED ORDER — POTASSIUM CHLORIDE CRYS ER 20 MEQ PO TBCR
40.0000 meq | EXTENDED_RELEASE_TABLET | Freq: Once | ORAL | Status: AC
  Administered 2022-08-12: 40 meq via ORAL
  Filled 2022-08-12: qty 2

## 2022-08-12 NOTE — Progress Notes (Signed)
Physical Therapy Treatment Patient Details Name: [name] MRN: [id number] DOB: 1955/11/27 Today's Date: 08/12/2022   History of Present Illness Patient is a 67 y/o female who presents on 1/22 for revision of complex lumbar wound, removal of left L1-L5 lumbar screws and rod, removal of right L1 screw and revision of L1-L5 posterior instrumented fusion. PMH includes DM, HTN, anxiety, left TSA, arthritis.    PT Comments    Pt received in supine, drowsy/lethargic and agreeable to therapy session with encouragement, with fair participation and tolerance for bed mobility and seated balance tasks. Pt needing +2 maxA to totalA for bed mobility and dense cues for log roll/back precautions. Pt self-limiting due to pain and fear of falls but demos slightly improved seated balance, at times progressing to min guard/minA but frequently needing maxA due to posterior lean/pain and fatigue. Pt unable to progress to standing trials and required +2 totalA for lateral seated scooting attempts. Pt continues to benefit from PT services to progress toward functional mobility goals.    Recommendations for follow up therapy are one component of a multi-disciplinary discharge planning process, led by the attending physician.  Recommendations may be updated based on patient status, additional functional criteria and insurance authorization.  Follow Up Recommendations  Skilled nursing-short term rehab (<3 hours/day) Can patient physically be transported by private vehicle: No   Assistance Recommended at Discharge Frequent or constant Supervision/Assistance  Patient can return home with the following Two people to help with walking and/or transfers;A lot of help with bathing/dressing/bathroom;Assist for transportation;Help with stairs or ramp for entrance;Direct supervision/assist for medications management;Assistance with cooking/housework   Equipment Recommendations  None recommended by PT (defer to  post-acute setting)    Recommendations for Other Services       Precautions / Restrictions Precautions Precautions: Back;Fall Precaution Booklet Issued: Yes (comment) Precaution Comments: JP drain intact, per surgeon note plan to remove today. Required Braces or Orthoses:  (no brace needed per neuro MD note) Restrictions Weight Bearing Restrictions: No     Mobility  Bed Mobility Overal bed mobility: Needs Assistance Bed Mobility: Rolling, Sidelying to Sit, Sit to Sidelying Rolling: Max assist Sidelying to sit: Max assist, +2 for physical assistance     Sit to sidelying: Total assist, +2 for physical assistance General bed mobility comments: Step by step cues for log roll technique, assist with LEs and to elevate trunk as well as scoot bottom to EOB. Assist to bring LEs back into bed and lower trunk. Pt needs greatly increased time and cues for pursed-lip breathing. Pt states "I'm not doing this" occasionally but with encouragement she is agreeable to proceed.    Transfers Overall transfer level: Needs assistance                Lateral/Scoot Transfers: Total assist, +2 physical assistance General transfer comment: Unable to stand due to poor sitting balance. Encouraged her to attempt lateral seated scooting toward [facility] but pt unable to signficantly assist despite B feet blocked and verbal/visual cues on technique.      Balance Overall balance assessment: Needs assistance Sitting-balance support: Feet supported, Bilateral upper extremity supported Sitting balance-Leahy Scale: Poor (Zero progressing to Poor) Sitting balance - Comments: Pt with initial heavy posterior lean, needing Mod/Max A +1-2 for upright balance for the first 3-4 mins. Worked on trying to find midline and sit unsupported reaching anterior (therapist in chair facing her and other therapist sitting next to her with variable assist at her shoulders above incision area.  pt  placed BUE on therapist legs and at  times only needing min guard/minA but frequently needing modA. Postural control: Posterior lean     Standing balance comment: pt unable to due to pain/lethargy and unable to lift hips from bed with lateral scooting (totalA to scoot)                            Cognition Arousal/Alertness: Lethargic, Suspect due to medications Behavior During Therapy: Anxious Overall Cognitive Status: Impaired/Different from baseline Area of Impairment: Orientation, Attention, Memory, Following commands, Safety/judgement, Problem solving, Awareness                   Current Attention Level: Focused, Sustained Memory: Decreased recall of precautions, Decreased short-term memory Following Commands: Follows one step commands with increased time, Follows one step commands inconsistently Safety/Judgement: Decreased awareness of safety, Decreased awareness of deficits Awareness: Intellectual Problem Solving: Slow processing, Decreased initiation, Difficulty sequencing, Requires verbal cues, Requires tactile cues General Comments: Pt verbalized fear of falls, decreased recall of precs during functional tasks needs reminders for log roll technique. Self-limiting needs heavy encouragement and explanation about each task as she performs it, pt more irritable this session during mobility tasks despite discussion throughout about plan for session and pt agreeable initially. Slow processing/response time.        Exercises Other Exercises Other Exercises: supine BLE AAROM: heel slides, RLE IR x5 reps ea, ankle pumps AROM x5 reps Other Exercises: focus on midline sitting posture with core activation in between rest breaks with posterior lean (therapist providing back support)    General Comments General comments (skin integrity, edema, etc.): Incision area with JP drain intact, minimal drainage, dressing c/d/i. Bed linens had dried stain on them so these were changed while pt sitting EOB and once pt  back in supine, pt rolled x2 for completion of linen adjustment and removal of soiled linens. BP soft 93/53 prior to session and BP not assessed sitting due to need to physically assist her, no syncopal symptoms but pt was slightly diaphoretic after prolonged sitting (room was also warm).      Pertinent Vitals/Pain Pain Assessment Pain Assessment: Faces Faces Pain Scale: Hurts even more Pain Location: back Pain Descriptors / Indicators: Sore, Operative site guarding, Grimacing, Moaning Pain Intervention(s): Monitored during session, Premedicated before session, Limited activity within patient's tolerance, Repositioned, Utilized relaxation techniques     PT Goals (current goals can now be found in the care plan section) Acute Rehab PT Goals Patient Stated Goal: decrease pain PT Goal Formulation: With patient Time For Goal Achievement: 08/24/22 Progress towards PT goals: Progressing toward goals (slowly)    Frequency    Min 4X/week      PT Plan Current plan remains appropriate    Co-evaluation PT/OT/SLP Co-Evaluation/Treatment: Yes Reason for Co-Treatment: Necessary to address cognition/behavior during functional activity;To address functional/ADL transfers PT goals addressed during session: Mobility/safety with mobility;Balance;Strengthening/ROM        AM-PAC PT "6 Clicks" Mobility   Outcome Measure  Help needed turning from your back to your side while in a flat bed without using bedrails?: A Lot Help needed moving from lying on your back to sitting on the side of a flat bed without using bedrails?: Total Help needed moving to and from a bed to a chair (including a wheelchair)?: Total Help needed standing up from a chair using your arms (e.g., wheelchair or bedside chair)?: Total Help needed to walk in hospital room?:  Total Help needed climbing 3-5 steps with a railing? : Total 6 Click Score: 7    End of Session   Activity Tolerance: Patient limited by  lethargy;Patient limited by pain;Other (comment) (Fear of falls) Patient left: in bed;with call bell/phone within reach;with bed alarm set;with SCD's reapplied;Other (comment) (bed in chair posture, SCDs on but R SCD malfunction) Nurse Communication: Mobility status PT Visit Diagnosis: Pain;Muscle weakness (generalized) (M62.81) Pain - part of body:  (Back)     Time: 0865-7846 PT Time Calculation (min) (ACUTE ONLY): 30 min  Charges:  $Therapeutic Activity: 8-22 mins                     Karolyna Bianchini P., PTA Acute Rehabilitation Services Secure Chat Preferred 9a-5:30pm Office: [phone]    Dorathy Kinsman [facility] 08/12/2022, 11:43 AM

## 2022-08-12 NOTE — Progress Notes (Signed)
Neurosurgery Service Progress Note  Subjective: No acute events overnight. Back pain is improved. Reports that she is very sleepy today.    Objective: Vitals:   08/11/22 1527 08/11/22 1934 08/12/22 0443 08/12/22 0944  BP: 119/68 99/70 103/60 (!) 93/53  Pulse: 98 100 99 100  Resp: 16 17 18 17   Temp: 98.6 F (37 C) 98.5 F (36.9 C) 98.5 F (36.9 C) 97.7 F (36.5 C)  TempSrc: Oral Oral Oral Oral  SpO2: 96% 95% 96% 97%  Weight:      Height:        Physical Exam: Strength 5/5 x4 and SILTx4, incision c/d/I   Assessment & Plan: 67 y.o. female s/p revision of complex lumbar wound, removal of left L1-L5 lumbar screws and rod, removal of right L1 screw, revision of L1-L5 posterior instrumented fusion, recovering well.    -continue PT/OT  -LE doppler done yesterday was negative  -continue drain, will pull it at discharge -SCDs / TEDs / SQH 5000 mg TID  -Lethargy - ordered UA / CBC / CMP / CXR  -Anticipate discharge back to SNF that she was residing at prior to surgery tomorrow    Norm Parcel, PA-C 08/12/22 10:59 AM

## 2022-08-12 NOTE — TOC Progression Note (Signed)
Transition of Care [facility]) - Progression Note    Patient Details  Name: [name] MRN: [id number] Date of Birth: 05-20-1956  Transition of Care [facility]) CM/SW [place], [place] Phone Number: 08/12/2022, 3:33 PM  Clinical Narrative:    CSW was notified pt is currently not medically ready. Per MD notes, if output continues to be low, pt can DC back to facility. CSW started authorization to prepare for DC when ready. It is pending at this time. TOC will continue to follow for DC needs.    Expected Discharge Plan: [place] Barriers to Discharge: Continued Medical Work up, Ship broker  Expected Discharge Plan and [place] Choice: [place] Living arrangements for the past 2 months: [place] Determinants of Health (SDOH) Interventions SDOH Screenings   Food Insecurity: No Food Insecurity (06/27/2022)  Housing: Low Risk  (06/27/2022)  Transportation Needs: No Transportation Needs (06/27/2022)  Utilities: Not At Risk (06/27/2022)  Tobacco Use: High Risk (08/09/2022)    Readmission Risk Interventions    07/14/2022    2:53 PM  Readmission Risk Prevention Plan  Transportation Screening Complete  PCP or Specialist Appt within 5-7 Days Complete  Home Care Screening Complete  Medication Review (RN CM) Complete

## 2022-08-12 NOTE — Progress Notes (Signed)
Lab notified this nurse of Potassium levels low 2.4. Notified MD via secure chat waiting on response. MD place new order see MAR.

## 2022-08-12 NOTE — Progress Notes (Signed)
Called Dr. Zada Finders and or Norm Parcel, PA at the office regarding pt's K of 2.4 and Na of 125 and the need for a CHG order since the pt has a foley. The receptionist got my # and will have them call me back.

## 2022-08-12 NOTE — Care Management Important Message (Signed)
Important Message  Patient Details  Name: [name] MRN: [id number] Date of Birth: [birth year]   Medicare Important Message Given:  Yes     [name] 08/12/2022, 11:27 AM

## 2022-08-12 NOTE — Progress Notes (Signed)
Occupational Therapy Treatment Patient Details Name: [name] MRN: [id number] DOB: 1955-11-26 Today's Date: 08/12/2022   History of present illness Patient is a 67 y/o female who presents on 1/22 for revision of complex lumbar wound, removal of left L1-L5 lumbar screws and rod, removal of right L1 screw and revision of L1-L5 posterior instrumented fusion. PMH includes DM, HTN, anxiety, left TSA, arthritis.   OT comments  Pt in bed upon therapy arrival. Session focused on bed mobility, activity tolerance, endurance, and core strength/stability. Pt presents with increased confusion overall with difficulty following 1 step commands. Increased verbal, visual, and tactile cues provided to participate in therapy activities. Pt was able to increase her static sitting balance from zero to poor although unable to maintain sitting position for an extended amount of time. Pt demonstrated difficulty opening her chapstick at end of session and increased fatigue.   Recommendations for follow up therapy are one component of a multi-disciplinary discharge planning process, led by the attending physician.  Recommendations may be updated based on patient status, additional functional criteria and insurance authorization.    Follow Up Recommendations  Skilled nursing-short term rehab (<3 hours/day)     Assistance Recommended at Discharge    Patient can return home with the following  Two people to help with walking and/or transfers;Two people to help with bathing/dressing/bathroom;Assist for transportation;Direct supervision/assist for medications management   Equipment Recommendations  Other (comment) (defer to next venue of care)       Precautions / Restrictions Precautions Precautions: Back;Fall Precaution Booklet Issued: Yes (comment) Precaution Comments: JP drain intact, per surgeon note plan to remove today. Restrictions Weight Bearing Restrictions: No       Mobility Bed  Mobility Overal bed mobility: Needs Assistance Bed Mobility: Rolling, Sidelying to Sit, Sit to Sidelying Rolling: Max assist, +2 for physical assistance Sidelying to sit: Max assist, +2 for physical assistance     Sit to sidelying: Total assist, +2 for physical assistance General bed mobility comments: Step by step cues for log roll technique, assist with LEs and to elevate trunk as well as scoot bottom to EOB. Assist to bring LEs back into bed and lower trunk. Pt needs greatly increased time and cues for pursed-lip breathing. Pt states "I'm not doing this" occasionally but with encouragement she is agreeable to proceed. Patient Response: Flat affect, Anxious  Transfers     General transfer comment: Unable to stand due to poor sitting balance. Pt encouraged to attempt lateral seated scoot towards [place] prior to laying down. Pt unable to complete with bilateral feet blocked and verbal/visual cues on technique. Total A x2 provided to complete.     Balance Overall balance assessment: Needs assistance Sitting-balance support: Feet supported, Bilateral upper extremity supported Sitting balance-Leahy Scale: Poor (zero progressing to poor) Sitting balance - Comments: Pt with initial heavy posterior lean, needing Mod/Max A +1-2 for upright balance for the first 3-4 mins. Worked on trying to find midline and sit unsupported reaching anterior (therapist in chair facing her and other therapist sitting next to her with variable assist at her shoulders above incision area.  pt placed BUE on therapist legs and at times only needing min guard/minA but frequently needing modA. Postural control: Posterior lean         ADL either performed or assessed with clinical judgement      Cognition Arousal/Alertness: Lethargic, Suspect due to medications Behavior During Therapy: Anxious Overall Cognitive Status: Impaired/Different from baseline Area of Impairment: Orientation, Attention, Memory, Following  commands, Safety/judgement, Problem solving, Awareness     Orientation Level: Disoriented to, Time   Memory: Decreased recall of precautions, Decreased short-term memory Following Commands: Follows one step commands with increased time, Follows one step commands inconsistently Safety/Judgement: Decreased awareness of safety, Decreased awareness of deficits Awareness: Intellectual Problem Solving: Slow processing, Decreased initiation, Difficulty sequencing, Requires verbal cues, Requires tactile cues General Comments: Pt verbalized fear of falls, decreased recall of precautions during functional tasks needs reminders for log roll technique. Self-limiting needs heavy encouragement and explanation about each task as she performs it, pt more irritable this session during mobility tasks despite discussion throughout about plan for session and pt agreeable initially. Slow processing/response time.              General Comments Incision area with JP drain, minimal drainage, dressing. Bed linens had dried stain and process of changing initiated while pt sitting EOB then finished while pt rolled 2X. BP soft 93/53 prior to session and BP unable to be assessed while sitting due to needing physical assist to maintain balance. No syncopal symptoms by pt was slightly diaphoretic while prolonged sitting. (room was hot and table fan on full blast.)    Pertinent Vitals/ Pain       Pain Assessment Pain Assessment: Faces Faces Pain Scale: Hurts whole lot Pain Location: back Pain Descriptors / Indicators: Sore, Operative site guarding, Grimacing, Moaning Pain Intervention(s): Limited activity within patient's tolerance, Monitored during session, Utilized relaxation techniques, Repositioned         Frequency  Min 2X/week        Progress Toward Goals  OT Goals(current goals can now be found in the care plan section)  Progress towards OT goals: Progressing toward goals     Plan Discharge plan  remains appropriate;Frequency remains appropriate    Co-evaluation    PT/OT/SLP Co-Evaluation/Treatment: Yes Reason for Co-Treatment: Complexity of the patient's impairments (multi-system involvement);To address functional/ADL transfers;For patient/therapist safety   OT goals addressed during session: Proper use of Adaptive equipment and DME;Strengthening/ROM;ADL's and self-care      AM-PAC OT "6 Clicks" Daily Activity     Outcome Measure   Help from another person eating meals?: A Lot Help from another person taking care of personal grooming?: A Lot Help from another person toileting, which includes using toliet, bedpan, or urinal?: Total Help from another person bathing (including washing, rinsing, drying)?: Total Help from another person to put on and taking off regular upper body clothing?: Total Help from another person to put on and taking off regular lower body clothing?: Total 6 Click Score: 8    End of Session    OT Visit Diagnosis: Muscle weakness (generalized) (M62.81);Repeated falls (R29.6)   Activity Tolerance Patient limited by fatigue;Patient limited by pain   Patient Left in bed;with call bell/phone within reach;with bed alarm set;with nursing/sitter in room   Nurse Communication Mobility status;Other (comment) (pt's tolerance with therapy during session)        Time: 7341-9379 OT Time Calculation (min): 30 min  Charges: OT General Charges $OT Visit: 1 Visit OT Treatments $Therapeutic Activity: 8-22 mins  Ailene Ravel, OTR/L,CBIS  Supplemental OT - [facility] and [facility] Secure Chat Preferred    Chiffon Kittleson, Clarene Duke 08/12/2022, 3:41 PM

## 2022-08-12 NOTE — Progress Notes (Signed)
Ok to replace hypokalemia with 4 runs of IV KCL and 18meq PO x1 per Dr. Zada Finders.  Onnie Boer, PharmD, BCIDP, AAHIVP, CPP Infectious Disease Pharmacist 08/12/2022 3:50 PM

## 2022-08-12 NOTE — Progress Notes (Addendum)
Neurosurgery Service Progress Note  Subjective: No acute events overnight. She feels more fatigued today, vitals signs stable except for a single borderline diastolic pressure but HR mildly elevated, denies CP/SOB, feels like her cough is improving if anything, no leg pain / swelling, back pain is there but better than last time, no other new symptoms  Objective: Vitals:   08/11/22 1527 08/11/22 1934 08/12/22 0443 08/12/22 0944  BP: 119/68 99/70 103/60 (!) 93/53  Pulse: 98 100 99 100  Resp: 16 17 18 17   Temp: 98.6 F (37 C) 98.5 F (36.9 C) 98.5 F (36.9 C) 97.7 F (36.5 C)  TempSrc: Oral Oral Oral Oral  SpO2: 96% 95% 96% 97%  Weight:      Height:        Physical Exam: Eyes open spontaneously but does look more tired today, oriented, strength 5/5 x4 and SILTx4  Assessment & Plan: 67 y.o. woman s/p removal of hardware / wound revision / revision of PSIF.  -will get labs today, DVT US still pending - scheduled for today at noon, given prior cough will get a CXR, as well as UA given she's had a foley in place. She did have some confusion post-op last time but this was more clearly related to PCA use.  -JP output continues to be minimal, can d/c -activity as tolerated -PT/OT -cont home Rx -SCDs/TEDs/SQH  Judith Part  08/12/22 10:56 AM

## 2022-08-12 NOTE — Progress Notes (Signed)
Discontinue patient Jp drain 10 cc out. Patient tolerated fair, patient stated a" little painful". Place gauze and tape to site and foam dressing to her incision site. Little drainage from site. Replace foam dressing to patient sacrum.

## 2022-08-12 NOTE — Progress Notes (Signed)
Lower extremity venous bilateral study completed.   Please see CV Proc for preliminary results.   Haylie Mccutcheon, RDMS, RVT  

## 2022-08-13 LAB — BASIC METABOLIC PANEL
Anion gap: 12 (ref 5–15)
BUN: <5 mg/dL — ABNORMAL LOW (ref 8–23)
CO2: 19 mmol/L — ABNORMAL LOW (ref 22–32)
Calcium: 7.4 mg/dL — ABNORMAL LOW (ref 8.9–10.3)
Chloride: 93 mmol/L — ABNORMAL LOW (ref 98–111)
Creatinine, Ser: 0.5 mg/dL (ref 0.44–1.00)
GFR, Estimated: >60 mL/min (ref >60)
Glucose, Bld: 83 mg/dL (ref 70–99)
Potassium: 3.6 mmol/L (ref 3.5–5.1)
Sodium: 124 mmol/L — ABNORMAL LOW (ref 135–145)

## 2022-08-13 MED ORDER — MUPIROCIN 2 % EX OINT
1.0000 | TOPICAL_OINTMENT | Freq: Two times a day (BID) | CUTANEOUS | Status: AC
  Administered 2022-08-13 – 2022-08-17 (×10): 1 via NASAL
  Filled 2022-08-13 (×4): qty 22

## 2022-08-13 MED ORDER — SODIUM CHLORIDE 0.9 % IV BOLUS
500.0000 mL | Freq: Once | INTRAVENOUS | Status: AC
  Administered 2022-08-13: 500 mL via INTRAVENOUS

## 2022-08-13 MED ORDER — OXYCODONE HCL 5 MG PO TABS
2.5000 mg | ORAL_TABLET | ORAL | Status: DC | PRN
  Administered 2022-08-20 (×2): 2.5 mg via ORAL
  Filled 2022-08-13 (×4): qty 1

## 2022-08-13 MED ORDER — CEFADROXIL 500 MG PO CAPS
1000.0000 mg | ORAL_CAPSULE | Freq: Two times a day (BID) | ORAL | Status: DC
  Administered 2022-08-13 – 2022-08-18 (×10): 1000 mg via ORAL
  Filled 2022-08-13 (×10): qty 2

## 2022-08-13 MED ORDER — OXYCODONE HCL 5 MG PO TABS
5.0000 mg | ORAL_TABLET | ORAL | Status: DC | PRN
  Administered 2022-08-13 – 2022-08-15 (×3): 5 mg via ORAL
  Filled 2022-08-13 (×2): qty 1

## 2022-08-13 NOTE — TOC Progression Note (Signed)
Transition of Care [facility]) - Progression Note    Patient Details  Name: [name] MRN: [id number] Date of Birth: 10-30-1955  Transition of Care [facility]) CM/SW [place], [place] Phone Number: 08/13/2022, 10:17 AM  Clinical Narrative:    Per MD note, if pt continues to improve, may be able to DC on Monday 1/29. Facility updated. Authorization approved through Tuesday 1/30. TOC will continue to follow for DC needs.    Expected Discharge Plan: [place] Barriers to Discharge: Continued Medical Work up, Ship broker  Expected Discharge Plan and [place] Choice: [place] Living arrangements for the past 2 months: [place] Determinants of Health (SDOH) Interventions SDOH Screenings   Food Insecurity: No Food Insecurity (06/27/2022)  Housing: Low Risk  (06/27/2022)  Transportation Needs: No Transportation Needs (06/27/2022)  Utilities: Not At Risk (06/27/2022)  Tobacco Use: High Risk (08/09/2022)    Readmission Risk Interventions    07/14/2022    2:53 PM  Readmission Risk Prevention Plan  Transportation Screening Complete  PCP or Specialist Appt within 5-7 Days Complete  Home Care Screening Complete  Medication Review (RN CM) Complete

## 2022-08-13 NOTE — Progress Notes (Signed)
PT Cancellation Note  Patient Details Name: JONAYA FRESHOUR MRN: [id number] DOB: 04-08-1956   Cancelled Treatment:    Reason Eval/Treat Not Completed: Patient declined, no reason specified (Pt reports just had a bath and is sore, also groggy and drifting to sleep while talking to therapist. Will follow up at later date/time as schedule allows and pt able.)   Verner Mould, [place] Office [phone]  08/13/22 3:29 PM

## 2022-08-13 NOTE — Plan of Care (Signed)

## 2022-08-13 NOTE — Plan of Care (Signed)
  Problem: Pain Management: Goal: Pain level will decrease Outcome: Progressing   Problem: Bowel/Gastric: Goal: Gastrointestinal status for postoperative course will improve Outcome: Progressing   Problem: Activity: Goal: Will remain free from falls Outcome: Progressing   Problem: Activity: Goal: Ability to tolerate increased activity will improve Outcome: Progressing

## 2022-08-13 NOTE — Progress Notes (Signed)
Neurosurgery Service Progress Note  Subjective: No acute events overnight. Feels a little better today, no new complaints  Objective: Vitals:   08/12/22 1541 08/12/22 2015 08/13/22 0503 08/13/22 0820  BP: 117/74 (!) 151/86 119/76 109/67  Pulse: 96 (!) 109 (!) 108   Resp: 17 18 20    Temp: 98.8 F (37.1 C) 100.1 F (37.8 C) 99.1 F (37.3 C)   TempSrc: Oral Oral Oral   SpO2: 95% 96% 98%   Weight:      Height:        Physical Exam: Strength 5/5 x4 and SILTx4, incision c/d/I   Assessment & Plan: 67 y.o. female s/p revision of complex lumbar wound, removal of left L1-L5 lumbar screws and rod, removal of right L1 screw, revision of L1-L5 posterior instrumented fusion, recovering well. DVT U/S neg, hypokalemia, hyponatremia   -continue PT/OT  -hyponatremia - Na at baseline, HR is mildly tachy with hypochloremia, will bolus 500cc NS  -SCDs / TEDs / SQH 5000 mg TID  -fatigue/lethargy: CXR mild retrocardiac opacity but she has a hemidiaphragm that may cause hypoinflation focally, resp sx improving -hypokalemia 2.7, repleting, now 2.9 -UTI: UA with leuks and some bacteria but neg nitrites, has had an indwelling catheter, will treat this as symptomatic with cefadrox x7d, stop date 08/19/22 -will decrease her oxy from 10/5 to 5/2.5 to reduce that variable, if she does well with the above and is stable or improved, plan for discharge Monday    Judith Part, MD

## 2022-08-14 ENCOUNTER — Inpatient Hospital Stay (HOSPITAL_COMMUNITY): Payer: Medicare Other

## 2022-08-14 LAB — GLUCOSE, CAPILLARY: Glucose-Capillary: 115 mg/dL — ABNORMAL HIGH (ref 70–99)

## 2022-08-14 NOTE — Progress Notes (Signed)
Called to patient room to assist position the patient after working with PT noted patient to be perspiring profusely, patient verbalized she is seeing doubles, patient is alert, answers appropriately. Blood sugar checked, Vitals check and within normal limits, dressing to incision site noted to have bleeding but minimal. Patient  medicated with Zofran IV. Will monitor.

## 2022-08-14 NOTE — Progress Notes (Signed)
Physical Therapy Treatment Patient Details Name: [name] MRN: [id number] DOB: 24-Apr-1956 Today's Date: 08/14/2022   History of Present Illness Patient is a 67 y/o female who presents on 1/22 for revision of complex lumbar wound, removal of left L1-L5 lumbar screws and rod, removal of right L1 screw and revision of L1-L5 posterior instrumented fusion. PMH includes DM, HTN, anxiety, left TSA, arthritis.    PT Comments    Pt received in supine, agreeable to therapy session and with good participation and fair tolerance for bed mobility and seated balance tasks. Pt tolerated sitting EOB ~10 mins with consistent mod/maxA external support required due to posterior lean. Emphasis on back precaution instruction, supine BLE HEP instruction, use of IS, importance of postural changes and self-assist with bed mobility during hygiene tasks to improve her strength. Pt with decreased recall of all exercises/precautions from previous session so handouts printed and one placed on wall per her request to remind pt/staff of "no BLT" precautions. Pt diaphoretic and c/o nausea after sitting up ~10 mins, PTA unable to check orthostatics due to lack of +2 assist and pt unable to self-support sitting EOB, plan to check orthostatic BP next session if able. Pt asking to speak with someone about dietary recommendations given her medical conditions and that she has been skipping meals, RN notified she may want dietician/nutritional consult. Pt continues to benefit from PT services to progress toward functional mobility goals.    Recommendations for follow up therapy are one component of a multi-disciplinary discharge planning process, led by the attending physician.  Recommendations may be updated based on patient status, additional functional criteria and insurance authorization.  Follow Up Recommendations  Skilled nursing-short term rehab (<3 hours/day) Can patient physically be transported by private vehicle: No    Assistance Recommended at Discharge Frequent or constant Supervision/Assistance  Patient can return home with the following Two people to help with walking and/or transfers;A lot of help with bathing/dressing/bathroom;Assist for transportation;Help with stairs or ramp for entrance;Direct supervision/assist for medications management;Assistance with cooking/housework   Equipment Recommendations  None recommended by PT (TBD)    Recommendations for Other Services       Precautions / Restrictions Precautions Precautions: Back;Fall Precaution Booklet Issued: Yes (comment) Precaution Comments: significant fear of falls, orthostatic symptoms when sitting up Restrictions Weight Bearing Restrictions: No     Mobility  Bed Mobility Overal bed mobility: Needs Assistance Bed Mobility: Rolling, Sidelying to Sit, Sit to Sidelying Rolling: Max assist Sidelying to sit: Max assist, +2 for safety/equipment, HOB elevated     Sit to sidelying: Max assist, +2 for safety/equipment General bed mobility comments: Step by step cues for log roll technique, assist with LEs and to elevate trunk as well as scoot bottom to EOB. Assist to bring LEs back into bed and lower trunk. Pt needs greatly increased time and cues for pursed-lip breathing. Pt with posterior lean upon sitting; totalA +2 for posterior supine scoot to [place] transfer comment: Unable to stand due to poor sitting balance and trunk control and c/o dizziness after sitting ~5 mins.       Balance Overall balance assessment: Needs assistance Sitting-balance support: Feet supported, Bilateral upper extremity supported Sitting balance-Leahy Scale: Zero Sitting balance - Comments: Mostly maxA +1 with PTA sitting face to face with pt and providing support at upper back/shoulders. Pt placing her hands on PTA knees.  Intermittently only needing minA but quick to fatigue and lean back again Postural control:  Posterior lean     Standing balance comment: pt unable                            Cognition Arousal/Alertness: Awake/alert Behavior During Therapy: Anxious Overall Cognitive Status: Impaired/Different from baseline Area of Impairment: Orientation, Attention, Memory, Following commands, Safety/judgement, Problem solving, Awareness                   Current Attention Level: Focused Memory: Decreased recall of precautions, Decreased short-term memory Following Commands: Follows one step commands with increased time, Follows one step commands inconsistently Safety/Judgement: Decreased awareness of safety, Decreased awareness of deficits Awareness: Intellectual Problem Solving: Slow processing, Decreased initiation, Difficulty sequencing, Requires verbal cues, Requires tactile cues General Comments: Pt verbalized fear of falls, decreased recall of precautions during functional tasks. She needs reminders for log roll technique. Slow processing/response time. Pt more calm this date and pain appears more controlled but she has difficulty recalling cues within session for familiar tasks. Pt reports she didn't order lunch so PTA called dietary dept for her and pt was able to make her order. More lethargic with fatigue at end of session, per RN she just had tylenol today.        Exercises General Exercises - Lower Extremity Ankle Circles/Pumps: AROM, Both, 20 reps, Supine Quad Sets: AROM, AAROM, Both, 15 reps, Supine Short Arc Quad: AROM, Both, 10 reps, Supine Heel Slides: AAROM, Both, 10 reps, Supine Hip ABduction/ADduction: AAROM, Both, 5 reps, Supine Other Exercises Other Exercises: seated BUE AROM: reaching anterior and to L/R x5 reps ea side Other Exercises: focus on midline sitting posture with core activation in between rest breaks with posterior lean (therapist providing back support) Other Exercises: lateral leans with elbow taps (emphasis on no bending/twisting of  back) with core activation x2 reps ea direction    General Comments General comments (skin integrity, edema, etc.): pt diaphoretic and reports she did not eat lunch, so RN called to room and checked her blood sugar, which was WNL. Per pt she has been drinking some regular ginger ale in lieu of eating lunch. Pt asked if she could speak with someone (dietician) about supplements/food options while in hospital. BP 150/82 HR 74 bpm and SpO2 95% on RA taken a few mins after pt returned to supine (bed chair posture)      Pertinent Vitals/Pain Pain Assessment Pain Assessment: Faces Faces Pain Scale: Hurts little more Pain Location: back Pain Descriptors / Indicators: Sore, Operative site guarding, Grimacing Pain Intervention(s): Monitored during session, Limited activity within patient's tolerance, Premedicated before session, Repositioned     PT Goals (current goals can now be found in the care plan section) Acute Rehab PT Goals Patient Stated Goal: decrease pain and to get stronger PT Goal Formulation: With patient Time For Goal Achievement: 08/24/22 Progress towards PT goals: Progressing toward goals    Frequency    Min 4X/week      PT Plan Current plan remains appropriate       AM-PAC PT "6 Clicks" Mobility   Outcome Measure  Help needed turning from your back to your side while in a flat bed without using bedrails?: A Lot Help needed moving from lying on your back to sitting on the side of a flat bed without using bedrails?: Total Help needed moving to and from a bed to a chair (including  a wheelchair)?: Total Help needed standing up from a chair using your arms (e.g., wheelchair or bedside chair)?: Total Help needed to walk in hospital room?: Total Help needed climbing 3-5 steps with a railing? : Total 6 Click Score: 7    End of Session   Activity Tolerance: Patient limited by pain;Patient limited by fatigue;Treatment limited secondary to medical complications  (Comment);Other (comment) (diaphoretic, nausea (pt reports she did not eat lunch instead drank soda)) Patient left: in bed;with call bell/phone within reach;with bed alarm set;with SCD's reapplied;Other (comment) (B heels floated, bed in chair posture) SCD malfunction -RN/NT notified Nurse Communication: Mobility status;Need for lift equipment;Precautions;Other (comment) (pt asking for dietician consult (skipping meals/not sure what to eat), pt may need orthostatics checked, diaphoretic) PT Visit Diagnosis: Pain;Muscle weakness (generalized) (M62.81) Pain - part of body:  (back)     Time: 1510-1556 PT Time Calculation (min) (ACUTE ONLY): 46 min  Charges:  $Therapeutic Exercise: 8-22 mins $Therapeutic Activity: 23-37 mins                     [place] Flett P., PTA Acute Rehabilitation Services Secure Chat Preferred 9a-5:30pm Office: [phone]    [facility] 08/14/2022, 4:14 PM

## 2022-08-14 NOTE — Progress Notes (Addendum)
Patient ID: [name], female   DOB: Jan 25, 1956, 67 y.o.   MRN: [id number] Vital signs are stable Patient appears congested and has rales at the bases bilaterally Dressing on back is saturated with blood I have asked nurses to change it twice a day and paint incision with Betadine It does appears that she has been mobilized. Will order chest x-ray to assess airway congestion and rales at bases.

## 2022-08-15 LAB — BASIC METABOLIC PANEL
Anion gap: 11 (ref 5–15)
BUN: <5 mg/dL — ABNORMAL LOW (ref 8–23)
CO2: 21 mmol/L — ABNORMAL LOW (ref 22–32)
Calcium: 7.8 mg/dL — ABNORMAL LOW (ref 8.9–10.3)
Chloride: 94 mmol/L — ABNORMAL LOW (ref 98–111)
Creatinine, Ser: 0.31 mg/dL — ABNORMAL LOW (ref 0.44–1.00)
GFR, Estimated: >60 mL/min (ref >60)
Glucose, Bld: 86 mg/dL (ref 70–99)
Potassium: 2 mmol/L — CL (ref 3.5–5.1)
Sodium: 126 mmol/L — ABNORMAL LOW (ref 135–145)

## 2022-08-15 LAB — CBC WITH DIFFERENTIAL/PLATELET
Abs Immature Granulocytes: 0.03 10*3/uL (ref 0.00–0.07)
Basophils Absolute: 0 10*3/uL (ref 0.0–0.1)
Basophils Relative: 0 %
Eosinophils Absolute: 0 10*3/uL (ref 0.0–0.5)
Eosinophils Relative: 0 %
HCT: 23.3 % — ABNORMAL LOW (ref 36.0–46.0)
Hemoglobin: 8.2 g/dL — ABNORMAL LOW (ref 12.0–15.0)
Immature Granulocytes: 0 %
Lymphocytes Relative: 17 %
Lymphs Abs: 1.1 10*3/uL (ref 0.7–4.0)
MCH: 31.4 pg (ref 26.0–34.0)
MCHC: 35.2 g/dL (ref 30.0–36.0)
MCV: 89.3 fL (ref 80.0–100.0)
Monocytes Absolute: 0.6 10*3/uL (ref 0.1–1.0)
Monocytes Relative: 9 %
Neutro Abs: 5 10*3/uL (ref 1.7–7.7)
Neutrophils Relative %: 74 %
Platelets: 357 10*3/uL (ref 150–400)
RBC: 2.61 MIL/uL — ABNORMAL LOW (ref 3.87–5.11)
RDW: 15.5 % (ref 11.5–15.5)
WBC: 6.8 10*3/uL (ref 4.0–10.5)
nRBC: 0 % (ref 0.0–0.2)

## 2022-08-15 LAB — C DIFFICILE (CDIFF) QUICK SCRN (NO PCR REFLEX)
C Diff antigen: NEGATIVE
C Diff interpretation: NOT DETECTED
C Diff toxin: NEGATIVE

## 2022-08-15 MED ORDER — POTASSIUM CHLORIDE CRYS ER 20 MEQ PO TBCR
20.0000 meq | EXTENDED_RELEASE_TABLET | Freq: Three times a day (TID) | ORAL | Status: DC
  Administered 2022-08-15 – 2022-08-16 (×3): 20 meq via ORAL
  Filled 2022-08-15 (×3): qty 1

## 2022-08-15 MED ORDER — DIPHENOXYLATE-ATROPINE 2.5-0.025 MG/5ML PO LIQD
5.0000 mL | Freq: Four times a day (QID) | ORAL | Status: DC | PRN
  Filled 2022-08-15: qty 10

## 2022-08-15 MED ORDER — DIPHENOXYLATE-ATROPINE 2.5-0.025 MG PO TABS
1.0000 | ORAL_TABLET | Freq: Four times a day (QID) | ORAL | Status: DC | PRN
  Administered 2022-08-15: 2 via ORAL
  Filled 2022-08-15: qty 2

## 2022-08-15 NOTE — Plan of Care (Signed)

## 2022-08-15 NOTE — Progress Notes (Signed)
Dr. Ellene Route notified about patient's potassium level of 2.0

## 2022-08-15 NOTE — Progress Notes (Signed)
Patient ID: [name], female   DOB: [birth year], 67 y.o.   MRN: [id number] BP 116/71 (BP Location: Right Arm)   Pulse 90   Temp 97.8 F (36.6 C) (Oral)   Resp 18   Ht 5' (1.524 m)   Wt 67.6 kg   SpO2 98%   BMI 29.10 kg/m  Alert, oriented to person, place, time and situation Moving all extremities Wound with sanguineous drainage, mainly from superior portion of the wound. Dressings changed. C difficile test ordered along with, labs. Spoke with daughter today.

## 2022-08-16 LAB — BASIC METABOLIC PANEL
Anion gap: 12 (ref 5–15)
Anion gap: 12 (ref 5–15)
BUN: <5 mg/dL — ABNORMAL LOW (ref 8–23)
BUN: <5 mg/dL — ABNORMAL LOW (ref 8–23)
CO2: 23 mmol/L (ref 22–32)
CO2: 24 mmol/L (ref 22–32)
Calcium: 7.6 mg/dL — ABNORMAL LOW (ref 8.9–10.3)
Calcium: 7.4 mg/dL — ABNORMAL LOW (ref 8.9–10.3)
Chloride: 95 mmol/L — ABNORMAL LOW (ref 98–111)
Chloride: 94 mmol/L — ABNORMAL LOW (ref 98–111)
Creatinine, Ser: 0.38 mg/dL — ABNORMAL LOW (ref 0.44–1.00)
Creatinine, Ser: 0.35 mg/dL — ABNORMAL LOW (ref 0.44–1.00)
GFR, Estimated: >60 mL/min (ref >60)
GFR, Estimated: >60 mL/min (ref >60)
Glucose, Bld: 82 mg/dL (ref 70–99)
Glucose, Bld: 137 mg/dL — ABNORMAL HIGH (ref 70–99)
Potassium: 2.3 mmol/L — CL (ref 3.5–5.1)
Potassium: 3.6 mmol/L (ref 3.5–5.1)
Sodium: 130 mmol/L — ABNORMAL LOW (ref 135–145)
Sodium: 130 mmol/L — ABNORMAL LOW (ref 135–145)

## 2022-08-16 LAB — GLUCOSE, CAPILLARY: Glucose-Capillary: 83 mg/dL (ref 70–99)

## 2022-08-16 LAB — MAGNESIUM: Magnesium: 1.4 mg/dL — ABNORMAL LOW (ref 1.7–2.4)

## 2022-08-16 MED ORDER — POTASSIUM CHLORIDE CRYS ER 20 MEQ PO TBCR
40.0000 meq | EXTENDED_RELEASE_TABLET | ORAL | Status: AC
  Administered 2022-08-16 (×3): 40 meq via ORAL
  Filled 2022-08-16 (×3): qty 2

## 2022-08-16 MED ORDER — POTASSIUM CHLORIDE CRYS ER 20 MEQ PO TBCR
40.0000 meq | EXTENDED_RELEASE_TABLET | Freq: Once | ORAL | Status: AC
  Administered 2022-08-16: 40 meq via ORAL
  Filled 2022-08-16: qty 2

## 2022-08-16 MED ORDER — MAGNESIUM SULFATE 4 GM/100ML IV SOLN
4.0000 g | Freq: Once | INTRAVENOUS | Status: AC
  Administered 2022-08-16: 4 g via INTRAVENOUS
  Filled 2022-08-16: qty 100

## 2022-08-16 NOTE — Progress Notes (Signed)
Neurosurgery Service Progress Note  Subjective: No acute events overnight. Feels a little better today, no new complaints  Objective: Vitals:   08/15/22 1733 08/15/22 2208 08/16/22 0607 08/16/22 0754  BP: 112/68 129/86 (!) 110/95 125/77  Pulse: 86 92 85 88  Resp: 17 20 16 18   Temp: 98.2 F (36.8 C) 98.4 F (36.9 C) 98.6 F (37 C) 97.9 F (36.6 C)  TempSrc: Oral Oral Oral Oral  SpO2: 93% 98% 98% 95%  Weight:      Height:        Physical Exam: Strength 5/5 x4 and SILTx4, incision c/d/I   Assessment & Plan: 67 y.o. female s/p revision of complex lumbar wound, removal of left L1-L5 lumbar screws and rod, removal of right L1 screw, revision of L1-L5 posterior instrumented fusion, recovering well. DVT U/S neg, hypokalemia, hyponatremia   -continue PT/OT  -hyponatremia - improving -SCDs / TEDs / SQH 5000 mg TID  -hypokalemia 2.7, not much movement with repletion, will replete, re-check this afternoon with a mag to make sure she can respond appropriately -UTI: UA with leuks and some bacteria but neg nitrites, has had an indwelling catheter, will treat this as symptomatic with cefadrox x7d, stop date 08/19/22 -possible discharge tomorrow 1/30 pending above   Judith Part, MD

## 2022-08-16 NOTE — Plan of Care (Signed)

## 2022-08-16 NOTE — Progress Notes (Signed)
Date and time results received: 08/16/22 0904 Test: Potassium Critical Value: 2.3  Name of Provider Notified: Ostergard,MD  Orders Received? Or Actions Taken?: Waiting for response and further instruction.

## 2022-08-16 NOTE — TOC Progression Note (Signed)
Transition of Care [facility]) - Progression Note    Patient Details  Name: [name] MRN: [id number] Date of Birth: 02-Nov-1955  [facility] CM/SW Contact  Coralee [place], [place] Phone Number: 08/16/2022, 12:33 PM  Clinical Narrative:    Per MD note. Pt may be medically ready to DC tomorrow. CSW updated facility. Authorization is still good for tomorrow. TOC will continue to follow for DC needs.    Expected Discharge Plan: [place] Barriers to Discharge: Continued Medical Work up, Ship broker  Expected Discharge Plan and [place] Choice: [place] Living arrangements for the past 2 months: [place] Determinants of Health (SDOH) Interventions SDOH Screenings   Food Insecurity: No Food Insecurity (06/27/2022)  Housing: Low Risk  (06/27/2022)  Transportation Needs: No Transportation Needs (06/27/2022)  Utilities: Not At Risk (06/27/2022)  Tobacco Use: High Risk (08/09/2022)    Readmission Risk Interventions    07/14/2022    2:53 PM  Readmission Risk Prevention Plan  Transportation Screening Complete  PCP or Specialist Appt within 5-7 Days Complete  Home Care Screening Complete  Medication Review (RN CM) Complete

## 2022-08-17 ENCOUNTER — Inpatient Hospital Stay (HOSPITAL_COMMUNITY): Payer: Medicare Other

## 2022-08-17 DIAGNOSIS — T8130XA Disruption of wound, unspecified, initial encounter: Secondary | ICD-10-CM | POA: Diagnosis not present

## 2022-08-17 LAB — BASIC METABOLIC PANEL
Anion gap: 9 (ref 5–15)
BUN: <5 mg/dL — ABNORMAL LOW (ref 8–23)
CO2: 23 mmol/L (ref 22–32)
Calcium: 7.5 mg/dL — ABNORMAL LOW (ref 8.9–10.3)
Chloride: 96 mmol/L — ABNORMAL LOW (ref 98–111)
Creatinine, Ser: 0.33 mg/dL — ABNORMAL LOW (ref 0.44–1.00)
GFR, Estimated: >60 mL/min (ref >60)
Glucose, Bld: 119 mg/dL — ABNORMAL HIGH (ref 70–99)
Potassium: 4.7 mmol/L (ref 3.5–5.1)
Sodium: 128 mmol/L — ABNORMAL LOW (ref 135–145)

## 2022-08-17 LAB — LACTIC ACID, PLASMA: Lactic Acid, Venous: 1.3 mmol/L (ref 0.5–1.9)

## 2022-08-17 LAB — CBC
HCT: 27.6 % — ABNORMAL LOW (ref 36.0–46.0)
Hemoglobin: 9.3 g/dL — ABNORMAL LOW (ref 12.0–15.0)
MCH: 31 pg (ref 26.0–34.0)
MCHC: 33.7 g/dL (ref 30.0–36.0)
MCV: 92 fL (ref 80.0–100.0)
Platelets: 614 10*3/uL — ABNORMAL HIGH (ref 150–400)
RBC: 3 MIL/uL — ABNORMAL LOW (ref 3.87–5.11)
RDW: 16.3 % — ABNORMAL HIGH (ref 11.5–15.5)
WBC: 12.7 10*3/uL — ABNORMAL HIGH (ref 4.0–10.5)
nRBC: 0 % (ref 0.0–0.2)

## 2022-08-17 LAB — AMMONIA: Ammonia: 19 umol/L (ref 9–35)

## 2022-08-17 LAB — URINALYSIS, ROUTINE W REFLEX MICROSCOPIC
Bilirubin Urine: NEGATIVE
Glucose, UA: NEGATIVE mg/dL
Hgb urine dipstick: NEGATIVE
Ketones, ur: 20 mg/dL — AB
Nitrite: NEGATIVE
Protein, ur: 30 mg/dL — AB
Specific Gravity, Urine: 1.014 (ref 1.005–1.030)
pH: 7 (ref 5.0–8.0)

## 2022-08-17 LAB — MAGNESIUM: Magnesium: 1.7 mg/dL (ref 1.7–2.4)

## 2022-08-17 LAB — TSH: TSH: 0.853 u[IU]/mL (ref 0.350–4.500)

## 2022-08-17 MED ORDER — SODIUM CHLORIDE 0.9 % IV SOLN: INTRAVENOUS | Status: DC

## 2022-08-17 MED ORDER — SODIUM CHLORIDE 0.9 % IV SOLN
250.0000 mL | INTRAVENOUS | Status: AC
  Administered 2022-08-17: 250 mL via INTRAVENOUS

## 2022-08-17 NOTE — Consult Note (Addendum)
History and Physical    [name] [id number] DOB: 01/20/1956 DOA: 08/09/2022  PCP: [name], MD (Confirm with patient/family/NH records and if not entered, this has to be entered at [facility] point of entry) Patient coming from: Home  I have personally briefly reviewed patient's old medical records in [place]  Chief Complaint: Feeling sick  HPI: [name] is a 67 y.o. female with medical history significant of mild cognitive impairment on Aricept, chronic HFrEF with LVEF 45-50%, nonobstructive CAD, small VSD, HTN, IIDM, came to hospital for dehiscence wound of her back with underlying rib fracture and baseline large segment scoliosis construct, underwent revision of complex lumbar wound and removal of screws and rod involving L1-L5 on August 09, 2022, medical team consulted for mentation changes.  Patient is confused, or history provided by daughter at bedside.  Daughter reported that the patient came out of surgery last Monday and has had episode of confusion, and new onset of and 10 fingers tremors which is new.  At baseline patient has diagnosed mild cognitive impairment and has been following with neurologist, for which she has been taking Aricept.  At baseline, patient is AAOx3 and active.  And daughter reported that this time after surgery, patient became sleepy " day and night" and very weak unwilling to get out of bed.  And overall still very weak and need two person to support her to even stand up.  Her oral intake has been intermittent .  3 days ago patient also developed severe watery diarrhea for which C. difficile test was sent which turned back negative and diarrhea improved after Imodium for last 2 days.  She also has a indwelling Foley catheter initially placed in in her first back surgery back in December however failed several void trials and maintained on Foley as of today.  Patient also has been having dry coughs for the last few days, and low-grade  fever this morning.  2 days ago patient was started on antibiotics to treat a UTI   Review of Systems: Unable to perform, patient is confused.  Past Medical History:  Diagnosis Date   Anemia    Anxiety    Arthritis    Depression    GERD (gastroesophageal reflux disease)    Heart murmur    "related to VSD"   High cholesterol    History of blood transfusion    "related to OR" (08/19/2016)   History of hiatal hernia    Hypertension    Hyperthyroidism    Mild cognitive impairment 09/06/2018   Paroxysmal ventricular tachycardia (HCC)    Type II diabetes mellitus ([place])    UTI (urinary tract infection)    being treated with Keflex   Ventricular septal defect     Past Surgical History:  Procedure Laterality Date   ABDOMINAL HYSTERECTOMY     BACK SURGERY     CARDIAC CATHETERIZATION N/A 06/23/2015   Procedure: Left Heart Cath and Coronary Angiography;  Surgeon: Larey Dresser, MD;  Location: [facility];  Service: Cardiovascular;  Laterality: N/A;   CARDIAC CATHETERIZATION  1960   "VSD was so small; didn't need repaired"   EXAM UNDER ANESTHESIA WITH MANIPULATION OF HIP Right 06/02/2014   dr Rhona Raider   FRACTURE SURGERY     HERNIA REPAIR     HIP CLOSED REDUCTION Right 06/02/2014   Procedure: CLOSED MANIPULATION HIP;  Surgeon: Hessie Dibble, MD;  Location: [facility];  Service: Orthopedics;  Laterality: Right;   JOINT  REPLACEMENT     JOINT REPLACEMENT     POSTERIOR LUMBAR FUSION 4 LEVEL N/A 06/26/2022   Procedure: Lumbar One To Lumbar Five Posterior Instrumented Fusion;  Surgeon: Judith Part, MD;  Location: [place];  Service: Neurosurgery;  Laterality: N/A;   REFRACTIVE SURGERY Bilateral    SHOULDER ARTHROSCOPY Right    SHOULDER OPEN ROTATOR CUFF REPAIR Right    SPINAL FUSION  1996   "t10 down to my coccyx   SPINE HARDWARE REMOVAL     TOTAL ABDOMINAL HYSTERECTOMY     TOTAL HIP ARTHROPLASTY Right 05/10/2014   [place]      by dr Harrell Gave [place]   TOTAL KNEE  ARTHROPLASTY Left    TOTAL SHOULDER ARTHROPLASTY Left 08/19/2016   Procedure: TOTAL SHOULDER ARTHROPLASTY;  Surgeon: Tania Ade, MD;  Location: [place];  Service: Orthopedics;  Laterality: Left;  Left total shoulder replacement     reports that she has been smoking cigarettes. She has never used smokeless tobacco. She reports that she does not drink alcohol and does not use drugs.  No Known Allergies  Family History  Problem Relation Age of Onset   Other Mother        alive   Stroke Father 38       deceased   Dementia Father    Chorea Maternal Grandfather    Dementia Maternal Aunt    Dementia Maternal Aunt    Heart attack Other        multiple uncles have died with myocardial infarction     Prior to Admission medications   Medication Sig Start Date End Date Taking? Authorizing Provider  acetaminophen (TYLENOL) 500 MG tablet Take 1,000 mg by mouth in the morning, at noon, and at bedtime. (0800, 1400 & 2000)   Yes [provider]  amLODipine (NORVASC) 5 MG tablet Take 5 mg by mouth in the morning. (0730) 10/01/19  Yes [provider]  carvedilol (COREG) 6.25 MG tablet Take 6.25 mg by mouth in the morning and at bedtime. (0730 & 2000)   Yes [provider]  cyclobenzaprine (FLEXERIL) 5 MG tablet Take 5 mg by mouth 3 (three) times daily. (0800, 1300 & 2000)   Yes [provider]  donepezil (ARICEPT) 10 MG tablet Take 10 mg by mouth daily.   Yes [provider]  fluvoxaMINE (LUVOX) 100 MG tablet Take 100 mg by mouth at bedtime.   Yes [provider]  gabapentin (NEURONTIN) 100 MG capsule Take 200 mg by mouth in the morning, at noon, and at bedtime. (0800, 1400 & 2000)   Yes [provider]  lamoTRIgine (LAMICTAL) 200 MG tablet Take 200 mg by mouth daily.   Yes [provider]  levothyroxine (SYNTHROID) 88 MCG tablet Take 88 mcg by mouth daily before breakfast. 09/11/20  Yes [provider]  loperamide  (IMODIUM) 2 MG capsule Take 2-4 mg by mouth See admin instructions. Take 1 capsule (2 mg) by mouth once daily scheduled & take 1-2 capsules (2-4 mg) by mouth once daily as needed for additional loose stools.   Yes [provider]  losartan (COZAAR) 100 MG tablet Take 100 mg by mouth at bedtime.   Yes [provider]  metFORMIN (GLUCOPHAGE) 500 MG tablet Take 500 mg by mouth in the morning and at bedtime. 02/10/21  Yes [provider]  Multiple Vitamin (MULTIVITAMIN) tablet Take 1 tablet by mouth daily.     Yes [provider]  ondansetron (ZOFRAN) 4 MG tablet  Take 1 tablet (4 mg total) by mouth every 6 (six) hours as needed for nausea or vomiting. 07/06/22  Yes Kristeen Miss, MD  OZEMPIC, 2 MG/DOSE, 8 MG/3ML SOPN Inject 2 mg into the skin every Monday.   Yes [provider]  pantoprazole (PROTONIX) 40 MG tablet Take 80 mg by mouth at bedtime. 12/11/19  Yes [provider]  QUEtiapine (SEROQUEL) 300 MG tablet Take 600 mg by mouth at bedtime.   Yes [provider]  Semaglutide, 1 MG/DOSE, (OZEMPIC, 1 MG/DOSE,) 4 MG/3ML SOPN Inject 2 mg into the skin every Monday.   Yes [provider]  sodium chloride 1 g tablet Take 1 g by mouth every other day. (0900)   Yes [provider]  traMADol (ULTRAM) 50 MG tablet Take 25 mg by mouth every 6 (six) hours as needed (PAIN).   Yes [provider]  traZODone (DESYREL) 100 MG tablet Take 100 mg by mouth at bedtime. 05/07/20  Yes [provider]  buPROPion (WELLBUTRIN XL) 300 MG 24 hr tablet Take 300 mg by mouth in the morning.  (0730)    [provider]  nicotine polacrilex (NICORETTE) 2 MG gum Take 2 mg by mouth daily.    [provider]    Physical Exam: Vitals:   08/17/22 0700 08/17/22 0848 08/17/22 1000 08/17/22 1516  BP: 139/78 135/88    Pulse: (!) 101 (!) 125  (!) 105  Resp: 18 17    Temp: 98.2 F (36.8 C) (!) 100.7 F (38.2 C) 99.6 F  (37.6 C) 98.5 F (36.9 C)  TempSrc:  Oral Oral Oral  SpO2: 96% 94%    Weight:      Height:        Constitutional: NAD, calm, comfortable Vitals:   08/17/22 0700 08/17/22 0848 08/17/22 1000 08/17/22 1516  BP: 139/78 135/88    Pulse: (!) 101 (!) 125  (!) 105  Resp: 18 17    Temp: 98.2 F (36.8 C) (!) 100.7 F (38.2 C) 99.6 F (37.6 C) 98.5 F (36.9 C)  TempSrc:  Oral Oral Oral  SpO2: 96% 94%    Weight:      Height:       Eyes: PERRL, lids and conjunctivae normal ENMT: Mucous membranes are dry. Posterior pharynx clear of any exudate or lesions.Normal dentition.  Neck: normal, supple, no masses, no thyromegaly Respiratory: clear to auscultation bilaterally, no wheezing, no crackles. Normal respiratory effort. No accessory muscle use.  Cardiovascular: Regular rate and rhythm, no murmurs / rubs / gallops. No extremity edema. 2+ pedal pulses. No carotid bruits.  Abdomen: no tenderness, no masses palpated. No hepatosplenomegaly. Bowel sounds positive.  Musculoskeletal: no clubbing / cyanosis. No joint deformity upper and lower extremities. Good ROM, no contractures. Normal muscle tone.  Skin: no rashes, lesions, ulcers. No induration Neurologic: CN 2-12 grossly intact. Sensation intact, DTR normal. Strength 5/5 in all 4.  Psychiatric: Awake, oriented to person and place, confused about time  Labs on Admission: I have personally reviewed following labs and imaging studies  CBC: Recent Labs  Lab 08/12/22 1118 08/15/22 1558  WBC 6.7 6.8  NEUTROABS  --  5.0  HGB 8.4* 8.2*  HCT 24.1* 23.3*  MCV 91.6 89.3  PLT 288 106   Basic Metabolic Panel: Recent Labs  Lab 08/13/22 0747 08/15/22 1558 08/16/22 0804 08/16/22 2052 08/17/22 0410  NA 124* 126* 130* 130* 128*  K 3.6 2.0* 2.3* 3.6 4.7  CL 93* 94* 95* 94* 96*  CO2 19* 21* 23 24 23   GLUCOSE 83 86 82 137* 119*  BUN <5* <5* <5* <5* <5*  CREATININE 0.50 0.31* 0.38* 0.35* 0.33*  CALCIUM 7.4* 7.8* 7.6* 7.4* 7.5*  MG  --   --   1.4*  --  1.7   GFR: Estimated Creatinine Clearance: 58.5 mL/min (A) (by C-G formula based on SCr of 0.33 mg/dL (L)). Liver Function Tests: Recent Labs  Lab 08/12/22 1118  AST 23  ALT 12  ALKPHOS 69  BILITOT 0.4  PROT 5.0*  ALBUMIN 2.0*   No results for input(s): "LIPASE", "AMYLASE" in the last 168 hours. No results for input(s): "AMMONIA" in the last 168 hours. Coagulation Profile: No results for input(s): "INR", "PROTIME" in the last 168 hours. Cardiac Enzymes: No results for input(s): "CKTOTAL", "CKMB", "CKMBINDEX", "TROPONINI" in the last 168 hours. BNP (last 3 results) No results for input(s): "PROBNP" in the last 8760 hours. HbA1C: No results for input(s): "HGBA1C" in the last 72 hours. CBG: Recent Labs  Lab 08/14/22 1549 08/16/22 0750  GLUCAP 115* 83   Lipid Profile: No results for input(s): "CHOL", "HDL", "LDLCALC", "TRIG", "CHOLHDL", "LDLDIRECT" in the last 72 hours. Thyroid Function Tests: No results for input(s): "TSH", "T4TOTAL", "FREET4", "T3FREE", "THYROIDAB" in the last 72 hours. Anemia Panel: No results for input(s): "VITAMINB12", "FOLATE", "FERRITIN", "TIBC", "IRON", "RETICCTPCT" in the last 72 hours. Urine analysis:    Component Value Date/Time   COLORURINE YELLOW 08/12/2022 1430   APPEARANCEUR HAZY (A) 08/12/2022 1430   LABSPEC 1.009 08/12/2022 1430   PHURINE 6.0 08/12/2022 1430   GLUCOSEU NEGATIVE 08/12/2022 1430   HGBUR NEGATIVE 08/12/2022 1430   BILIRUBINUR NEGATIVE 08/12/2022 1430   KETONESUR 20 (A) 08/12/2022 1430   PROTEINUR NEGATIVE 08/12/2022 1430   UROBILINOGEN 0.2 06/02/2014 1705   NITRITE NEGATIVE 08/12/2022 1430   LEUKOCYTESUR LARGE (A) 08/12/2022 1430    Radiological Exams on Admission: No results found.  EKG: Independently reviewed.  Sinus, no acute ST changes.  Assessment/Plan Principal Problem:   Wound dehiscence  (please populate well all problems here in Problem List. (For example, if patient is on BP meds at home and  you resume or decide to hold them, it is a problem that needs to be her. Same for CAD, COPD, HLD and so on)  AMS  -With new onset of fever 100.7 today, rule out sepsis -Check CBC, blood cultures lactic acid -Chest x-ray reviewed, appears that patient has a stable left lower lung atelectasis but no other acute infiltrates.  Will not escalate her current antibiotics regimen. -Discussed with neurosurgery PA, low suspicion for back surgery wound infection -Continue to treat UTI while waiting for urine culture. -Other DDx, ammonium level came back normal.  TSH within normal limits.  Polypharmacy unlikely as review patient's MAR showed that no excessive narcotics or sedation medications given in the last 2 days.  And her diarrhea has stopped, unlikely to cause fever today.  SIRS -Manage possible infection as above -Medically patient appears to be dehydrated, plan to start patient on IV fluid x 12 hours monitor volume status.  Complicated UTI -With chronic indwelling Foley catheter -Continue cefadroxil  Hyponatremia -Acute on chronic, baseline Na level 126-132. Na level about her baseline, unlike to cause her AMS.  Left lower lung atelectasis -Appears to be chronic, encourage more frequent use of incentive spirometry.  HTN -Continue Amlodipine and Coreg  Hx mild cognitive impairment -Continue Aricept  Hx of mood disorder -Continue lamotrigine  IIDM -A1C=6.0, diet control  Surgical  wound dehiscence status post I&D and hardware removal -Discussed with neurosurgical PA, surgical wound appears to be clean no signs of infection, and patient has been on doxycycline since the surgery.  We will follow along with you.   Lequita Halt MD [facility] Pager [phone]  08/17/2022, 3:35 PM

## 2022-08-17 NOTE — Progress Notes (Signed)
Neurosurgery Service Progress Note  Subjective: No acute events overnight. States that she does not "feel right" but is unable to explain what she is feeling. When asked if she is feeling pain, n/v, feverish, tired, chills she responds with - "I'm not sure."   Objective: Vitals:   08/17/22 0519 08/17/22 0700 08/17/22 0848 08/17/22 1000  BP: 138/88 139/78 135/88   Pulse: (!) 115 (!) 101 (!) 125   Resp: 19 18 17    Temp: 98 F (36.7 C) 98.2 F (36.8 C) (!) 100.7 F (38.2 C) 99.6 F (37.6 C)  TempSrc: Oral  Oral Oral  SpO2: 93% 96% 94%   Weight:      Height:        Physical Exam: AAOx3 but is not able to respond appropriately to question, Strength 5/5 x4 and SILTx4, incision c/d/I   Assessment & Plan: 67 y.o. female s/p revision of complex lumbar wound, removal of left L1-L5 lumbar screws and rod, removal of right L1 screw, revision of L1-L5 posterior instrumented fusion, recovering well. DVT U/S neg, hypokalemia improved, hyponatremia improved  -continue PT/OT  -hyponatremia - improving -SCDs / TEDs / SQH 5000 mg TID  -hypokalemia 4.7, which is significantly improved from 2.3.  -UTI: UA with leuks and some bacteria but neg nitrites, has had an indwelling catheter, will treat this as symptomatic with cefadrox x7d, stop date 08/19/22 -will consult medicine to assist with AMS  Norm Parcel, PA-C 08/17/22 1:11 PM

## 2022-08-17 NOTE — Progress Notes (Signed)
OT Cancellation Note  Patient Details Name: [name] MRN: [id number] DOB: 12/12/1955   Cancelled Treatment:    Reason Eval/Treat Not Completed: Patient not medically ready (Pt unable to participate in OT tx due to STAT labs and chest CT ordered. Will re-attempt at a later time.)  Ailene Ravel, OTR/L,CBIS  Supplemental OT - [facility] and [facility] Secure Chat Preferred   08/17/2022, 3:20 PM

## 2022-08-17 NOTE — Progress Notes (Signed)
Physical Therapy Treatment Patient Details Name: [name] MRN: [id number] DOB: November 10, 1955 Today's Date: 08/17/2022   History of Present Illness Patient is a 67 y/o female who presents on 1/22 for revision of complex lumbar wound, removal of left L1-L5 lumbar screws and rod, removal of right L1 screw and revision of L1-L5 posterior instrumented fusion. 1/30 pt febrile and more lethargic in AM, MD ordered CXR. PMH includes DM, HTN, anxiety, left TSA, arthritis.    PT Comments    Pt received in supine, lethargic and more confused appearing, pt with inconsistent recall of events of AM (whether or not she ate breakfast, etc) and having difficulty following 2-step commands with slow processing of 1-step commands. Pt daughter present and reports MD ordered labs and chest x-ray, lab techs arriving at end of session so emphasis mostly on family education on use of IS/frequency, supine BLE exercises for caregiver assist (when family present), BLT back precautions and benefits of mobility. Plan to reassess seated orthostatics and transfers next session if pt alert and able to tolerate transfers/seated balance activity. Pt continues to benefit from PT services to progress toward functional mobility goals.    Recommendations for follow up therapy are one component of a multi-disciplinary discharge planning process, led by the attending physician.  Recommendations may be updated based on patient status, additional functional criteria and insurance authorization.  Follow Up Recommendations  Skilled nursing-short term rehab (<3 hours/day) Can patient physically be transported by private vehicle: No   Assistance Recommended at Discharge Frequent or constant Supervision/Assistance  Patient can return home with the following Two people to help with walking and/or transfers;A lot of help with bathing/dressing/bathroom;Assist for transportation;Help with stairs or ramp for entrance;Direct supervision/assist  for medications management;Assistance with cooking/housework   Equipment Recommendations  None recommended by PT (TBD)    Recommendations for Other Services       Precautions / Restrictions Precautions Precautions: Back;Fall Precaution Booklet Issued: Yes (comment) Precaution Comments: significant fear of falls, orthostatic symptoms when sitting up Restrictions Weight Bearing Restrictions: No     Mobility  Bed Mobility Overal bed mobility: Needs Assistance Bed Mobility: Supine to Sit, Sit to Supine Rolling: Max assist         General bed mobility comments: partial log roll to assist pt to sit upright more comfortably with OT assisting pt, pt lethargic and not able to assist as much as previous session. Defer EOB/OOB due to lethargy and arrival of lab staff to take stat labs.    Transfers                   General transfer comment: defer given limited session, arrival of lab techs       Balance       Sitting balance - Comments: defer for pt safety; too lethargic today       Standing balance comment: pt unable                            Cognition Arousal/Alertness: Lethargic Behavior During Therapy: Flat affect Overall Cognitive Status: Impaired/Different from baseline Area of Impairment: Orientation, Attention, Memory, Following commands, Safety/judgement, Problem solving, Awareness                 Orientation Level:  (limited assessment due to arrival of lab techs and short session) Current Attention Level: Focused Memory: Decreased recall of precautions, Decreased short-term memory Following Commands: Follows one step commands with increased time, Follows  one step commands inconsistently Safety/Judgement: Decreased awareness of safety, Decreased awareness of deficits Awareness: Intellectual Problem Solving: Slow processing, Decreased initiation, Difficulty sequencing, Requires verbal cues, Requires tactile cues General Comments: Pt  more lethargic this session and per chart review she is pending labs and CXR, pt daughter present and concerned about her medical stability, so emphasis on family education for reinforcement of back precs, supine BLE exercises and IS use/handout. Pt daughter very receptive for all instruction. Pt inconsistent with report as to whether she ate breakfast or not with therapists.        Exercises General Exercises - Lower Extremity Ankle Circles/Pumps: AROM, Both, 20 reps, Supine Quad Sets: AAROM, Supine, Both, 5 reps    General Comments General comments (skin integrity, edema, etc.): reviewed use of IS, BLT precs and supine BLE HEP handout with daughter as pt more lethargic this date. Pt VS checked and HR 107 bpm after transfer to bed chair posture and temperature WFL 98.5 degrees.      Pertinent Vitals/Pain Pain Assessment Pain Assessment: Faces Faces Pain Scale: Hurts little more Pain Location: back Pain Descriptors / Indicators: Sore, Operative site guarding, Grimacing Pain Intervention(s): Monitored during session, Limited activity within patient's tolerance, Premedicated before session, Repositioned     PT Goals (current goals can now be found in the care plan section) Acute Rehab PT Goals Patient Stated Goal: decrease pain and to get stronger PT Goal Formulation: With patient Time For Goal Achievement: 08/24/22 Progress towards PT goals: Progressing toward goals (slowly this date due to pt lethargy)    Frequency    Min 4X/week      PT Plan Current plan remains appropriate       AM-PAC PT "6 Clicks" Mobility   Outcome Measure  Help needed turning from your back to your side while in a flat bed without using bedrails?: A Lot Help needed moving from lying on your back to sitting on the side of a flat bed without using bedrails?: Total Help needed moving to and from a bed to a chair (including a wheelchair)?: Total Help needed standing up from a chair using your arms  (e.g., wheelchair or bedside chair)?: Total Help needed to walk in hospital room?: Total Help needed climbing 3-5 steps with a railing? : Total 6 Click Score: 7    End of Session   Activity Tolerance: Patient limited by fatigue;Treatment limited secondary to medical complications (Comment);Other (comment);Patient limited by lethargy (plan for labs/CXR, lab techs entering room at end of session) Patient left: in bed;with call bell/phone within reach;with bed alarm set;with nursing/sitter in room;with family/visitor present;Other (comment) (bed in chair posture, HOB >30* for aspiration prevention, pt heels floated) Nurse Communication: Mobility status;Need for lift equipment;Precautions;Other (comment) (pt asking for dietician consult (skipping meals/not sure what to eat)) PT Visit Diagnosis: Pain;Muscle weakness (generalized) (M62.81) Pain - part of body:  (back)     Time: 6387-5643 PT Time Calculation (min) (ACUTE ONLY): 12 min  Charges:  $Therapeutic Activity: 8-22 mins                     Nell Gales P., PTA Acute Rehabilitation Services Secure Chat Preferred 9a-5:30pm Office: [place] 08/17/2022, 5:45 PM

## 2022-08-18 ENCOUNTER — Inpatient Hospital Stay (HOSPITAL_COMMUNITY): Payer: Medicare Other

## 2022-08-18 DIAGNOSIS — A419 Sepsis, unspecified organism: Secondary | ICD-10-CM

## 2022-08-18 DIAGNOSIS — T8130XA Disruption of wound, unspecified, initial encounter: Secondary | ICD-10-CM | POA: Diagnosis not present

## 2022-08-18 LAB — BLOOD GAS, VENOUS
Acid-base deficit: 2 mmol/L (ref 0.0–2.0)
Bicarbonate: 20.4 mmol/L (ref 20.0–28.0)
O2 Saturation: 72.1 %
Patient temperature: 38.4
pCO2, Ven: 30 mmHg — ABNORMAL LOW (ref 44–60)
pH, Ven: 7.45 — ABNORMAL HIGH (ref 7.25–7.43)
pO2, Ven: 43 mmHg (ref 32–45)

## 2022-08-18 LAB — CBC
HCT: 30.7 % — ABNORMAL LOW (ref 36.0–46.0)
Hemoglobin: 10.2 g/dL — ABNORMAL LOW (ref 12.0–15.0)
MCH: 31.5 pg (ref 26.0–34.0)
MCHC: 33.2 g/dL (ref 30.0–36.0)
MCV: 94.8 fL (ref 80.0–100.0)
Platelets: 809 10*3/uL — ABNORMAL HIGH (ref 150–400)
RBC: 3.24 MIL/uL — ABNORMAL LOW (ref 3.87–5.11)
RDW: 16.9 % — ABNORMAL HIGH (ref 11.5–15.5)
WBC: 19.9 10*3/uL — ABNORMAL HIGH (ref 4.0–10.5)
nRBC: 0.1 % (ref 0.0–0.2)

## 2022-08-18 LAB — LACTIC ACID, PLASMA
Lactic Acid, Venous: 2.9 mmol/L (ref 0.5–1.9)
Lactic Acid, Venous: 2.5 mmol/L (ref 0.5–1.9)

## 2022-08-18 LAB — BASIC METABOLIC PANEL
Anion gap: 13 (ref 5–15)
BUN: 6 mg/dL — ABNORMAL LOW (ref 8–23)
CO2: 18 mmol/L — ABNORMAL LOW (ref 22–32)
Calcium: 7.8 mg/dL — ABNORMAL LOW (ref 8.9–10.3)
Chloride: 93 mmol/L — ABNORMAL LOW (ref 98–111)
Creatinine, Ser: 0.42 mg/dL — ABNORMAL LOW (ref 0.44–1.00)
GFR, Estimated: >60 mL/min (ref >60)
Glucose, Bld: 245 mg/dL — ABNORMAL HIGH (ref 70–99)
Potassium: 3.8 mmol/L (ref 3.5–5.1)
Sodium: 124 mmol/L — ABNORMAL LOW (ref 135–145)

## 2022-08-18 MED ORDER — VANCOMYCIN HCL 1500 MG/300ML IV SOLN
1500.0000 mg | Freq: Once | INTRAVENOUS | Status: AC
  Administered 2022-08-18: 1500 mg via INTRAVENOUS
  Filled 2022-08-18: qty 300

## 2022-08-18 MED ORDER — VANCOMYCIN HCL IN DEXTROSE 1-5 GM/200ML-% IV SOLN: 1000.0000 mg | INTRAVENOUS | Status: DC

## 2022-08-18 MED ORDER — SODIUM CHLORIDE 0.9 % IV SOLN: INTRAVENOUS | Status: AC

## 2022-08-18 MED ORDER — POTASSIUM CHLORIDE IN NACL 20-0.9 MEQ/L-% IV SOLN
INTRAVENOUS | Status: DC
  Filled 2022-08-18: qty 1000

## 2022-08-18 MED ORDER — GABAPENTIN 100 MG PO CAPS
100.0000 mg | ORAL_CAPSULE | Freq: Three times a day (TID) | ORAL | Status: DC
  Administered 2022-08-18: 100 mg via ORAL
  Filled 2022-08-18 (×2): qty 1

## 2022-08-18 MED ORDER — ACETAMINOPHEN 650 MG RE SUPP
975.0000 mg | Freq: Once | RECTAL | Status: AC
  Administered 2022-08-18: 975 mg via RECTAL
  Filled 2022-08-18: qty 2

## 2022-08-18 MED ORDER — PIPERACILLIN-TAZOBACTAM 3.375 G IVPB
3.3750 g | Freq: Three times a day (TID) | INTRAVENOUS | Status: DC
  Administered 2022-08-18 – 2022-08-19 (×2): 3.375 g via INTRAVENOUS
  Filled 2022-08-18 (×2): qty 50

## 2022-08-18 NOTE — Progress Notes (Signed)
Physical Therapy Treatment Patient Details Name: [name] MRN: [id number] DOB: [birth year] Today's Date: 08/18/2022   History of Present Illness Patient is a 67 y/o female who presents on 1/22 for revision of complex lumbar wound, removal of left L1-L5 lumbar screws and rod, removal of right L1 screw and revision of L1-L5 posterior instrumented fusion. 1/30 pt febrile and more lethargic in AM, MD ordered CXR. PMH includes DM, HTN, anxiety, left TSA, arthritis.    PT Comments    Pt received in supine, lethargic and with slow processing and noted confusion, at times with possible word finding difficulty versus delirium, RN notified. Pt with poor command following and needed +2 totalA for rolling toward her R side (per family she had been facing to L side for ~6-8 hours) and pt/family given instruction on pressure relief strategies and importance of rolling/repositioning in bed q2H and HOB elevated >30* with attempts to eat/drink to prevent aspiration. Pt would benefit from dietician consult given poor PO intake reported from pt/family. Pt with elevated RR/HR and bowel incontinence, VS entered into flowsheet and RN notified pt needs hygiene assist. Pt with some UE edema, BUE elevated on pillows. Pt continues to benefit from PT services to progress toward functional mobility goals, making slow progress given unstable VS and lethargy.    Recommendations for follow up therapy are one component of a multi-disciplinary discharge planning process, led by the attending physician.  Recommendations may be updated based on patient status, additional functional criteria and insurance authorization.  Follow Up Recommendations  Skilled nursing-short term rehab (<3 hours/day) Can patient physically be transported by private vehicle: No   Assistance Recommended at Discharge Frequent or constant Supervision/Assistance  Patient can return home with the following Two people to help with walking and/or  transfers;A lot of help with bathing/dressing/bathroom;Assist for transportation;Help with stairs or ramp for entrance;Direct supervision/assist for medications management;Assistance with cooking/housework   Equipment Recommendations  None recommended by PT (TBD)    Recommendations for Other Services Other (comment) (Dietician consult)     Precautions / Restrictions Precautions Precautions: Back;Fall Precaution Booklet Issued: Yes (comment) Precaution Comments: significant fear of falls, orthostatic symptoms when sitting up Restrictions Weight Bearing Restrictions: Yes     Mobility  Bed Mobility Overal bed mobility: Needs Assistance Bed Mobility: Rolling Rolling: Total assist, +2 for physical assistance         General bed mobility comments: Pt lethargic and not able to follow commands for BLE activation and cross-body reaching for log roll, needing hand over hand assist to grasp bed rail. used bed pad to assist with rolling. Defer EOB/OOB due to lethargy and elevated HR/RR. Pillows placed behind her L hip to relieve pressure. Pt noted to be soiled upon changing position and RN notified she will need a bed bath.    Transfers                   General transfer comment: defer given elevated HR/RR.       Balance       Sitting balance - Comments: defer for pt safety       Standing balance comment: pt unable                            Cognition Arousal/Alertness: Lethargic Behavior During Therapy: Flat affect Overall Cognitive Status: Impaired/Different from baseline Area of Impairment: Orientation, Attention, Memory, Following commands, Safety/judgement, Problem solving, Awareness  Orientation Level:  (limited assessment due to arrival of lab techs and short session) Current Attention Level: Focused Memory: Decreased recall of precautions, Decreased short-term memory Following Commands: Follows one step commands with  increased time, Follows one step commands inconsistently Safety/Judgement: Decreased awareness of safety, Decreased awareness of deficits Awareness: Intellectual Problem Solving: Slow processing, Decreased initiation, Difficulty sequencing, Requires verbal cues, Requires tactile cues General Comments: Pt lethargic this afternoon, unable to answer all orientation questions and with continued BUE tremors and slow processing. She was having difficulty attending to cues and with possible delirium. Pt female family member in room and pt at times appearing not to recognize him. Pt family members in room x2 present for portions of session and very receptive for all instruction.        Exercises Other Exercises Other Exercises: supine BUE A/AAROM: gross grasp/finger extension x10 reps ea Other Exercises: IS x 10 reps (pt not following instructions well to breathe in through tube, only achieves <200 mL) Other Exercises: flutter valve x5 reps    General Comments General comments (skin integrity, edema, etc.): Reviewed use of IS/flutter valve, BLT precs and pressure relief strategies with pt and family member who was present as pt more lethargic this date and not following commands well. Pt VS checked and HR 125 bpm at rest and RR 36 rpm and temperature WFL 98.3 degrees. RN notified of pt elevated HR/RR and needing bed bath.      Pertinent Vitals/Pain Pain Assessment Pain Assessment: Faces Faces Pain Scale: Hurts little more Pain Location: pt did not localize, assume back/surgical site Pain Descriptors / Indicators: Grimacing Pain Intervention(s): Monitored during session, Repositioned, Limited activity within patient's tolerance     PT Goals (current goals can now be found in the care plan section) Acute Rehab PT Goals Patient Stated Goal: Decrease pain and to get stronger PT Goal Formulation: With patient Time For Goal Achievement: 08/24/22 Progress towards PT goals: Not progressing toward goals  - comment (functional decline)    Frequency    Min 4X/week      PT Plan Current plan remains appropriate       AM-PAC PT "6 Clicks" Mobility   Outcome Measure  Help needed turning from your back to your side while in a flat bed without using bedrails?: Total Help needed moving from lying on your back to sitting on the side of a flat bed without using bedrails?: Total Help needed moving to and from a bed to a chair (including a wheelchair)?: Total Help needed standing up from a chair using your arms (e.g., wheelchair or bedside chair)?: Total Help needed to walk in hospital room?: Total Help needed climbing 3-5 steps with a railing? : Total 6 Click Score: 6    End of Session   Activity Tolerance: Patient limited by fatigue;Treatment limited secondary to medical complications (Comment);Other (comment);Patient limited by lethargy (tachycardia/tachypnea and apparent confusion/poor command following) Patient left: in bed;with call bell/phone within reach;with bed alarm set;with nursing/sitter in room;with family/visitor present;Other (comment) (HOB >30* for aspiration prevention, pillows placed behind her L hip and back as family reports she has been turned toward her L side for past 8 hours) Nurse Communication: Mobility status;Need for lift equipment;Precautions;Other (comment) (unstable VS, pt still not eating well per family report, MD notified pt would benefit from dietician consult) PT Visit Diagnosis: Pain;Muscle weakness (generalized) (M62.81) Pain - part of body:  (back)     Time: 2683-4196 PT Time Calculation (min) (ACUTE ONLY): 28 min  Charges:  $  Therapeutic Activity: 8-22 mins $Neuromuscular Re-education: 8-22 mins                     Sharlie Shreffler P., PTA Acute Rehabilitation Services Secure Chat Preferred 9a-5:30pm Office: [place] 08/18/2022, 5:01 PM

## 2022-08-18 NOTE — Progress Notes (Signed)
MD notified, critical lab value 2.9

## 2022-08-18 NOTE — Care Management Important Message (Signed)
Important Message  Patient Details  Name: [name] MRN: [id number] Date of Birth: 1955/08/12   Medicare Important Message Given:  Yes     [name] 08/18/2022, 11:46 AM

## 2022-08-18 NOTE — Progress Notes (Signed)
  X-cover Note: Pt transferred from 6N to 3W due to fever and red MEWS score. Pt remains febrile. Rectal tylenol ordered due to RN concern about pt's ability to swallow po meds.   Kristopher Oppenheim, DO [facility]

## 2022-08-18 NOTE — Progress Notes (Addendum)
Pharmacy Antibiotic Note  [name] is a 67 y.o. female admitted on 08/09/2022 for revision of hardware and wound..  Pharmacy has been consulted for Vancomycin and Zosyn dosing  for sepsis/wound infection.  Scr is  0.33,, will use Scr 0.8 for dosing. BMI 29)   Plan: Zosyn 3.75g IV q8h (extended infusion) Vancomycin 1500 mg x1 then 1000 mg IV Q 24 hrs. Goal AUC 400-550. Expected AUC: 455.7 SCr used: 0.8 (actual 0.33), Vd 0.72  (BMI 29) Monitor clinical status, renal function and culture results daily.  Vanc levels at [facility] as needed per protocol.    Height: 5' (152.4 cm) Weight: 67.6 kg (149 lb) IBW/kg (Calculated) : 45.5  Temp (24hrs), Avg:99.3 F (37.4 C), Min:98.2 F (36.8 C), Max:101 F (38.3 C)  Recent Labs  Lab 08/12/22 1118 08/13/22 0747 08/15/22 1558 08/16/22 0804 08/16/22 2052 08/17/22 0410 08/17/22 1851 08/17/22 1857  WBC 6.7  --  6.8  --   --   --  12.7*  --   CREATININE 0.36* 0.50 0.31* 0.38* 0.35* 0.33*  --   --   LATICACIDVEN  --   --   --   --   --   --   --  1.3    Estimated Creatinine Clearance: 58.5 mL/min (A) (by C-G formula based on SCr of 0.33 mg/dL (L)).    No Known Allergies  Antimicrobials this admission: Cipro 1/25 >>1/26  Doxy 1/23 >>1/31 Cefadroxil 1/26 >>1/31 Vancomycin 1/31>> Zosyn 1/31>>    Dose adjustments this admission: n/a  Microbiology results: 1/22 MRSA PCR: neg;   SA: positive 1/28 C.diff - negative 1/30 BCx x2: ngtd 1/30 BCx x2: ngtd  Thank you for allowing pharmacy to be a part of this patient's care.  Nicole Cella, RPh Clinical Pharmacist Please check AMION for all [place] phone numbers After 10:00 PM, call [place] [phone]  08/18/2022 4:22 PM

## 2022-08-18 NOTE — Progress Notes (Addendum)
[facility]  PROGRESS NOTE  [name] ALP:[id number] DOB: 01/31/1956 DOA: 08/09/2022 PCP: [name], MD   Brief HPI:   67 year old female with medical history of mild cognitive impairment, on Aricept, chronic HFrEF with EF 45 to 50%, nonobstructive CAD, small VSD, hypertension, diabetes mellitus type 2 came to hospital for dehiscence wound on her back with underlying rib fracture and baseline lower segment scoliosis construct, underwent revision of complex lumbar wound and removal of screws and rod involving L1-L5 on August 09, 2022, hospitalist team was consulted for mentation changes. As per patient started after patient came out of surgery last Monday she had episode of confusion and also had tremors which was new.  At baseline patient has mild cognitive impairment and has been following with neurologist and has been taking Aricept.  At baseline patient is alert, oriented x 3 and active.  3 days ago patient developed watery diarrhea for which C. difficile test was done which came back negative.  She also has indwelling Foley catheter and has been started empirically on cefadroxil for UTI since 08/13/2022.    Subjective   Today patient found to be tachypneic, cold and clammy extremities.  Also had rhonchi bilaterally.  IV fluids were started yesterday empirically.   Assessment/Plan:    SIRS -Concern for developing early sepsis, also concern for developing pulmonary edema -Patient had bilateral rhonchi and was tachypneic, IV fluids were stopped -Patient blood pressure has been stable, she has been getting cefadroxil for UTI -CBC obtained today shows WBC 19.9 -Will start vancomycin and Zosyn empirically for early sepsis -Check lactic acid -Avoid IV fluids as patient was tachypneic with bilateral rhonchi, developing fluid overload -Will keep a close watch on patient's vitals and start IV fluids if she starts developing hypotension -Blood cultures x 2 were obtained  yesterday, urine culture was not obtained -Follow culture results -Unclear source of infection, chest x-ray showed no pneumonia this morning, UA was abnormal but patient has chronic indwelling Foley catheter -Patient has wound dehiscence with serosanguineous discharge as per wound care RN -Will continue with antibiotics at this time  Metabolic encephalopathy -Mental status is slowly improving -Patient takes multiple psychotropic medications at home -Will discontinue fluvoxamine, Wellbutrin -Cut down the dose of gabapentin 200 mg p.o. 3 times daily -Started on antibiotics as above  Complicated UTI -Patient has chronic indwelling Foley catheter -Started on antibiotics as above  Hyponatremia -Acute on chronic -Patient baseline sodium is 1 26-1 32  Left lower lobe atelectasis -Encourage incentive spirometry  Hypertension -Continue Coreg -Will hold amlodipine, losartan  History of mood disorder -Continue lamotrigine  Surgical wound dehiscence s/p incision and drainage and hardware removal -Neurosurgery did not feel that the wound was infected -Patient started on antibiotics as above due to rising WBC, fever     Medications     carvedilol  6.25 mg Oral BID WC   Chlorhexidine Gluconate Cloth  6 each Topical Daily   donepezil  10 mg Oral Daily   gabapentin  100 mg Oral TID   heparin  5,000 Units Subcutaneous Q8H   lamoTRIgine  200 mg Oral Daily   levothyroxine  88 mcg Oral QAC breakfast   losartan  100 mg Oral QHS   multivitamin with minerals  1 tablet Oral Daily   nicotine polacrilex  2 mg Oral Daily   pantoprazole  80 mg Oral QHS   sodium chloride flush  3 mL Intravenous Q12H   sodium chloride  1 g Oral BID WC  tamsulosin  0.4 mg Oral Daily     Data Reviewed:   CBG:  Recent Labs  Lab 08/14/22 1549 08/16/22 0750  GLUCAP 115* 83    SpO2: 95 %    Vitals:   08/18/22 0934 08/18/22 1608 08/18/22 1620 08/18/22 1805  BP: 126/84 123/87    Pulse: (!) 106 (!)  123 (!) 125   Resp: 18  (!) 36   Temp: 98.2 F (36.8 C) 98.3 F (36.8 C)  99.7 F (37.6 C)  TempSrc: Oral Oral  Oral  SpO2: 91% 95%    Weight:      Height:          Data Reviewed:  Basic Metabolic Panel: Recent Labs  Lab 08/15/22 1558 08/16/22 0804 08/16/22 2052 08/17/22 0410 08/18/22 1545  NA 126* 130* 130* 128* 124*  K 2.0* 2.3* 3.6 4.7 3.8  CL 94* 95* 94* 96* 93*  CO2 21* 23 24 23  18*  GLUCOSE 86 82 137* 119* 245*  BUN <5* <5* <5* <5* 6*  CREATININE 0.31* 0.38* 0.35* 0.33* 0.42*  CALCIUM 7.8* 7.6* 7.4* 7.5* 7.8*  MG  --  1.4*  --  1.7  --     CBC: Recent Labs  Lab 08/12/22 1118 08/15/22 1558 08/17/22 1851 08/18/22 1545  WBC 6.7 6.8 12.7* 19.9*  NEUTROABS  --  5.0  --   --   HGB 8.4* 8.2* 9.3* 10.2*  HCT 24.1* 23.3* 27.6* 30.7*  MCV 91.6 89.3 92.0 94.8  PLT 288 357 614* 809*    LFT Recent Labs  Lab 08/12/22 1118  AST 23  ALT 12  ALKPHOS 69  BILITOT 0.4  PROT 5.0*  ALBUMIN 2.0*     Antibiotics: Anti-infectives (From admission, onward)    Start     Dose/Rate Route Frequency Ordered Stop   08/19/22 1800  vancomycin (VANCOCIN) IVPB 1000 mg/200 mL premix        1,000 mg 200 mL/hr over 60 Minutes Intravenous Every 24 hours 08/18/22 1628     08/18/22 1715  piperacillin-tazobactam (ZOSYN) IVPB 3.375 g        3.375 g 12.5 mL/hr over 240 Minutes Intravenous Every 8 hours 08/18/22 1619     08/18/22 1715  vancomycin (VANCOREADY) IVPB 1500 mg/300 mL        1,500 mg 150 mL/hr over 120 Minutes Intravenous  Once 08/18/22 1628     08/13/22 1000  cefadroxil (DURICEF) capsule 1,000 mg  Status:  Discontinued        1,000 mg over 6 Days Oral 2 times daily 08/13/22 0838 08/18/22 1611   08/12/22 2000  ciprofloxacin (CIPRO) tablet 500 mg  Status:  Discontinued        500 mg Oral 2 times daily 08/12/22 1709 08/13/22 0839   08/10/22 1500  doxycycline (VIBRA-TABS) tablet 100 mg  Status:  Discontinued        100 mg Oral Every 12 hours 08/10/22 1410 08/18/22 1502    08/09/22 2100  ceFAZolin (ANCEF) IVPB 2g/100 mL premix        2 g 200 mL/hr over 30 Minutes Intravenous Every 8 hours 08/09/22 1704 08/10/22 0547   08/09/22 1030  ceFAZolin (ANCEF) IVPB 2g/100 mL premix        2 g 200 mL/hr over 30 Minutes Intravenous On call to O.R. 08/09/22 1019 08/09/22 1315   08/09/22 1022  ceFAZolin (ANCEF) 2-4 GM/100ML-% IVPB       Note to Pharmacy: Marlena Clipper S: cabinet override  08/09/22 1022 08/09/22 1136          Objective    Physical Examination:   General: Appears lethargic Cardiovascular: S1-S2, regular Respiratory: Bilateral rhonchi auscultated Abdomen: Soft, nontender, no organomegaly Extremities: No edema in the lower extremities Neurologic: Alert, oriented to self only              Oswald Hillock   [facility] If 7PM-7AM, please contact night-coverage at [email or web address], Office  [phone]   08/18/2022, 6:33 PM  LOS: 9 days

## 2022-08-18 NOTE — Progress Notes (Signed)
Neurosurgery Service Progress Note  Subjective: No acute events overnight. States that she feels "weird" but is unable to describe what that means. Family is at bedside and is concerns about her AMS, new post-op intention tremor, neurogenic bladder with retained foley catheter, low grade fever, and cough. However, the family and myself agree that the patient is more like herself today compared to the last two days.    Objective: Vitals:   08/18/22 0052 08/18/22 0426 08/18/22 0643 08/18/22 0934  BP: 112/81 121/82  126/84  Pulse: (!) 101 (!) 105  (!) 106  Resp: 17   18  Temp: 99.7 F (37.6 C) 98.3 F (36.8 C) (!) 101 F (38.3 C) 98.2 F (36.8 C)  TempSrc: Oral Oral Oral Oral  SpO2: 98% 93%  91%  Weight:      Height:        Physical Exam: AAOx3, FS, PERRLA, Strength 5/5 x4 and SILTx4 incision mildly dehisced without signs of infection  Assessment & Plan: 67 y.o. female s/p revision of complex lumbar wound, removal of left L1-L5 lumbar screws and rod, removal of right L1 screw, revision of L1-L5 posterior instrumented fusion, recovering well. DVT U/S neg, hypokalemia improved, hyponatremia improved, AMS improving  -continue PT/OT  -wound dehiscence - consulted [place] team for placement of Prevena wound vac  -AMS improved today, labs appear that she is dehydrated. Will start IV fluids w/ Kcl and order repeat labs for tomorrow AM.  -SCDs / TEDs / SQH 5000 mg TID  -hypokalemia resolved - K on 08/17/22 was 4.7 -UTI: UA now with small leuks and rare bacteria but neg nitrites, has had an indwelling catheter, will treat this as symptomatic with cefadrox x7d, stop date 08/19/22 -continue to appreciate medicine service assistance with AMS  -CXR is stable with atelectasis, which can certainly be the cause of her low grade fever. Continue to use incentive spirometer.  -neurogenic bladder - continue indwelling foley and follow-up with urology as an outpatient for further treatment    Norm Parcel, PA-C 08/18/22 9:45 AM

## 2022-08-18 NOTE — Consult Note (Signed)
WOC Nurse Consult Note: Reason for Consult: dehiscence of lumbar surgical incision.  Asked to apply incisional NPWT   Wound type:infectious, surgical Pressure Injury POA: NA Measurement: 13 cm suture line.  Dehiscence noted Wound bed: spouse states hardware was protruding at one time.  Today, slough noted  Drainage (amount, consistency, odor) minimal serosanguinous  no odor Periwound: erythema.  Likely from tape of previous wound dressing.  Dressing procedure/placement/frequency: Prevena incisional VAC applied. Barrier ring at the apex of the gluteal cleft to promote seal.   Will not follow at this time.  Please re-consult if needed.  Estrellita Ludwig MSN, RN, FNP-BC CWON Wound, Ostomy, Continence Nurse [facility] [phone] Pager [phone]

## 2022-08-18 NOTE — TOC Progression Note (Signed)
Transition of Care [facility]) - Progression Note    Patient Details  Name: DOTTY GONZALO MRN: [id number] Date of Birth: [birth year]  [facility] of [facility] CM/SW [place], [place] Phone Number: 08/18/2022, 11:53 AM  Clinical Narrative:    CSW continuing to follow for placement needs. Facility updated daily on DC status. Insurance authorization has expired, will need to be restarted when closer to being medically ready. TOC will continue to follow for DC needs.    Expected Discharge Plan: [place] Barriers to Discharge: Continued Medical Work up, Ship broker  Expected Discharge Plan and [place] Choice: [place] Living arrangements for the past 2 months: [place] Determinants of Health (SDOH) Interventions SDOH Screenings   Food Insecurity: No Food Insecurity (06/27/2022)  Housing: Low Risk  (06/27/2022)  Transportation Needs: No Transportation Needs (06/27/2022)  Utilities: Not At Risk (06/27/2022)  Tobacco Use: High Risk (08/09/2022)    Readmission Risk Interventions    07/14/2022    2:53 PM  Readmission Risk Prevention Plan  Transportation Screening Complete  PCP or Specialist Appt within 5-7 Days Complete  Home Care Screening Complete  Medication Review (RN CM) Complete

## 2022-08-18 NOTE — Progress Notes (Signed)
Patient transferred from 6N. Her temp is still 102.8, HR 120-130, RR in 30s, minimally responsive, BP 122/82. Tylenol given at [place], but temp still 102.8. Some crackles auscultated in upper lobes and very diminished in lower lobes. MEWS is still red.  Above page sent to Bridgett Larsson, MD [place] orders received.

## 2022-08-18 NOTE — Progress Notes (Signed)
Pt has arrived

## 2022-08-18 NOTE — Progress Notes (Signed)
   08/18/22 1608  Assess: MEWS Score  Temp 98.3 F (36.8 C)  BP 123/87  MAP (mmHg) 98  Pulse Rate (!) 123  Level of Consciousness Alert  SpO2 95 %  O2 Device Room Air  Assess: MEWS Score  MEWS Temp 0  MEWS Systolic 0  MEWS Pulse 2  MEWS RR 0  MEWS LOC 0  MEWS Score 2  MEWS Score Color Yellow  Treat  Pain Scale 0-10  Pain Score Asleep  Patients Stated Pain Goal 0  Pain Intervention(s) MD notified (Comment)  Breathing 0  Negative Vocalization 0  Facial Expression 0  Body Language 0  Neuro symptoms relieved by Rest  Take Vital Signs  Increase Vital Sign Frequency  Yellow: Q 2hr X 2 then Q 4hr X 2, if remains yellow, continue Q 4hrs  Notify: Charge Nurse/RN  Name of Charge Nurse/RN Notified Larene Beach'  Date Charge Nurse/RN Notified 08/18/22  Time Charge Nurse/RN Notified [id number]  Document  Patient Outcome Stabilized after interventions;Other (Comment) (MD = new ordesr)  Assess: SIRS CRITERIA  SIRS Temperature  0  SIRS Pulse 1  SIRS Respirations  0  SIRS WBC 0  SIRS Score Sum  1

## 2022-08-19 ENCOUNTER — Inpatient Hospital Stay (HOSPITAL_COMMUNITY): Payer: Medicare Other

## 2022-08-19 DIAGNOSIS — T847XXA Infection and inflammatory reaction due to other internal orthopedic prosthetic devices, implants and grafts, initial encounter: Secondary | ICD-10-CM

## 2022-08-19 DIAGNOSIS — R509 Fever, unspecified: Secondary | ICD-10-CM

## 2022-08-19 DIAGNOSIS — M4620 Osteomyelitis of vertebra, site unspecified: Secondary | ICD-10-CM

## 2022-08-19 DIAGNOSIS — R569 Unspecified convulsions: Secondary | ICD-10-CM

## 2022-08-19 DIAGNOSIS — A419 Sepsis, unspecified organism: Secondary | ICD-10-CM

## 2022-08-19 DIAGNOSIS — T8130XA Disruption of wound, unspecified, initial encounter: Secondary | ICD-10-CM | POA: Diagnosis not present

## 2022-08-19 DIAGNOSIS — F02B0 Dementia in other diseases classified elsewhere, moderate, without behavioral disturbance, psychotic disturbance, mood disturbance, and anxiety: Secondary | ICD-10-CM

## 2022-08-19 DIAGNOSIS — G301 Alzheimer's disease with late onset: Secondary | ICD-10-CM

## 2022-08-19 DIAGNOSIS — R4182 Altered mental status, unspecified: Secondary | ICD-10-CM

## 2022-08-19 DIAGNOSIS — J690 Pneumonitis due to inhalation of food and vomit: Secondary | ICD-10-CM

## 2022-08-19 DIAGNOSIS — G9340 Encephalopathy, unspecified: Secondary | ICD-10-CM

## 2022-08-19 DIAGNOSIS — J9601 Acute respiratory failure with hypoxia: Secondary | ICD-10-CM

## 2022-08-19 LAB — BASIC METABOLIC PANEL
Anion gap: 11 (ref 5–15)
BUN: 16 mg/dL (ref 8–23)
CO2: 19 mmol/L — ABNORMAL LOW (ref 22–32)
Calcium: 8.6 mg/dL — ABNORMAL LOW (ref 8.9–10.3)
Chloride: 101 mmol/L (ref 98–111)
Creatinine, Ser: 1.46 mg/dL — ABNORMAL HIGH (ref 0.44–1.00)
GFR, Estimated: 39 mL/min — ABNORMAL LOW (ref >60)
Glucose, Bld: 106 mg/dL — ABNORMAL HIGH (ref 70–99)
Potassium: 4.3 mmol/L (ref 3.5–5.1)
Sodium: 131 mmol/L — ABNORMAL LOW (ref 135–145)

## 2022-08-19 LAB — POCT I-STAT 7, (LYTES, BLD GAS, ICA,H+H)
Acid-base deficit: 10 mmol/L — ABNORMAL HIGH (ref 0.0–2.0)
Bicarbonate: 17.1 mmol/L — ABNORMAL LOW (ref 20.0–28.0)
Calcium, Ion: 1.19 mmol/L (ref 1.15–1.40)
HCT: 31 % — ABNORMAL LOW (ref 36.0–46.0)
Hemoglobin: 10.5 g/dL — ABNORMAL LOW (ref 12.0–15.0)
O2 Saturation: 100 %
Potassium: 3.2 mmol/L — ABNORMAL LOW (ref 3.5–5.1)
Sodium: 127 mmol/L — ABNORMAL LOW (ref 135–145)
TCO2: 18 mmol/L — ABNORMAL LOW (ref 22–32)
pCO2 arterial: 42.2 mmHg (ref 32–48)
pH, Arterial: 7.215 — ABNORMAL LOW (ref 7.35–7.45)
pO2, Arterial: 342 mmHg — ABNORMAL HIGH (ref 83–108)

## 2022-08-19 LAB — COMPREHENSIVE METABOLIC PANEL
ALT: 13 U/L (ref 0–44)
AST: 32 U/L (ref 15–41)
Albumin: 2 g/dL — ABNORMAL LOW (ref 3.5–5.0)
Alkaline Phosphatase: 66 U/L (ref 38–126)
Anion gap: 11 (ref 5–15)
BUN: 9 mg/dL (ref 8–23)
CO2: 17 mmol/L — ABNORMAL LOW (ref 22–32)
Calcium: 7.6 mg/dL — ABNORMAL LOW (ref 8.9–10.3)
Chloride: 98 mmol/L (ref 98–111)
Creatinine, Ser: 0.44 mg/dL (ref 0.44–1.00)
GFR, Estimated: >60 mL/min (ref >60)
Glucose, Bld: 238 mg/dL — ABNORMAL HIGH (ref 70–99)
Potassium: 3.3 mmol/L — ABNORMAL LOW (ref 3.5–5.1)
Sodium: 126 mmol/L — ABNORMAL LOW (ref 135–145)
Total Bilirubin: 0.7 mg/dL (ref 0.3–1.2)
Total Protein: 5.3 g/dL — ABNORMAL LOW (ref 6.5–8.1)

## 2022-08-19 LAB — CBC WITH DIFFERENTIAL/PLATELET
Abs Immature Granulocytes: 0.26 10*3/uL — ABNORMAL HIGH (ref 0.00–0.07)
Basophils Absolute: 0 10*3/uL (ref 0.0–0.1)
Basophils Relative: 0 %
Eosinophils Absolute: 0 10*3/uL (ref 0.0–0.5)
Eosinophils Relative: 0 %
HCT: 27.2 % — ABNORMAL LOW (ref 36.0–46.0)
Hemoglobin: 9.3 g/dL — ABNORMAL LOW (ref 12.0–15.0)
Immature Granulocytes: 2 %
Lymphocytes Relative: 10 %
Lymphs Abs: 1.7 10*3/uL (ref 0.7–4.0)
MCH: 32 pg (ref 26.0–34.0)
MCHC: 34.2 g/dL (ref 30.0–36.0)
MCV: 93.5 fL (ref 80.0–100.0)
Monocytes Absolute: 1.3 10*3/uL — ABNORMAL HIGH (ref 0.1–1.0)
Monocytes Relative: 8 %
Neutro Abs: 14.2 10*3/uL — ABNORMAL HIGH (ref 1.7–7.7)
Neutrophils Relative %: 80 %
Platelets: 639 10*3/uL — ABNORMAL HIGH (ref 150–400)
RBC: 2.91 MIL/uL — ABNORMAL LOW (ref 3.87–5.11)
RDW: 17.2 % — ABNORMAL HIGH (ref 11.5–15.5)
WBC: 17.5 10*3/uL — ABNORMAL HIGH (ref 4.0–10.5)
nRBC: 0 % (ref 0.0–0.2)

## 2022-08-19 LAB — CBC
HCT: 29.5 % — ABNORMAL LOW (ref 36.0–46.0)
Hemoglobin: 8.9 g/dL — ABNORMAL LOW (ref 12.0–15.0)
MCH: 27.1 pg (ref 26.0–34.0)
MCHC: 30.2 g/dL (ref 30.0–36.0)
MCV: 89.7 fL (ref 80.0–100.0)
Platelets: 112 10*3/uL — ABNORMAL LOW (ref 150–400)
RBC: 3.29 MIL/uL — ABNORMAL LOW (ref 3.87–5.11)
RDW: 20.5 % — ABNORMAL HIGH (ref 11.5–15.5)
WBC: 6.2 10*3/uL (ref 4.0–10.5)
nRBC: 0 % (ref 0.0–0.2)

## 2022-08-19 LAB — TROPONIN I (HIGH SENSITIVITY): Troponin I (High Sensitivity): 169 ng/L (ref <18)

## 2022-08-19 LAB — PROTIME-INR
INR: 1.5 — ABNORMAL HIGH (ref 0.8–1.2)
Prothrombin Time: 17.9 seconds — ABNORMAL HIGH (ref 11.4–15.2)

## 2022-08-19 LAB — SEDIMENTATION RATE: Sed Rate: 37 mm/hr — ABNORMAL HIGH (ref 0–22)

## 2022-08-19 LAB — LACTIC ACID, PLASMA
Lactic Acid, Venous: 2.6 mmol/L (ref 0.5–1.9)
Lactic Acid, Venous: 3.4 mmol/L (ref 0.5–1.9)
Lactic Acid, Venous: 2.8 mmol/L (ref 0.5–1.9)
Lactic Acid, Venous: 2 mmol/L (ref 0.5–1.9)

## 2022-08-19 LAB — C-REACTIVE PROTEIN: CRP: 4 mg/dL — ABNORMAL HIGH (ref <1.0)

## 2022-08-19 LAB — GLUCOSE, CAPILLARY
Glucose-Capillary: 186 mg/dL — ABNORMAL HIGH (ref 70–99)
Glucose-Capillary: 82 mg/dL (ref 70–99)

## 2022-08-19 LAB — HEPATITIS A ANTIBODY, TOTAL: hep A Total Ab: NONREACTIVE

## 2022-08-19 LAB — APTT: aPTT: 37 seconds — ABNORMAL HIGH (ref 24–36)

## 2022-08-19 LAB — BRAIN NATRIURETIC PEPTIDE: B Natriuretic Peptide: 1213.8 pg/mL — ABNORMAL HIGH (ref 0.0–100.0)

## 2022-08-19 LAB — HEPATITIS C ANTIBODY: HCV Ab: NONREACTIVE

## 2022-08-19 LAB — TSH: TSH: 1.082 u[IU]/mL (ref 0.350–4.500)

## 2022-08-19 LAB — AMMONIA: Ammonia: 67 umol/L — ABNORMAL HIGH (ref 9–35)

## 2022-08-19 MED ORDER — ORAL CARE MOUTH RINSE: 15.0000 mL | OROMUCOSAL | Status: DC | PRN

## 2022-08-19 MED ORDER — ORAL CARE MOUTH RINSE
15.0000 mL | OROMUCOSAL | Status: DC
  Administered 2022-08-19 – 2022-08-30 (×129): 15 mL via OROMUCOSAL

## 2022-08-19 MED ORDER — FENTANYL CITRATE PF 50 MCG/ML IJ SOSY
25.0000 ug | PREFILLED_SYRINGE | INTRAMUSCULAR | Status: DC | PRN
  Administered 2022-08-23: 25 ug via INTRAVENOUS
  Filled 2022-08-19 (×2): qty 1

## 2022-08-19 MED ORDER — DOCUSATE SODIUM 50 MG/5ML PO LIQD
100.0000 mg | Freq: Two times a day (BID) | ORAL | Status: DC
  Administered 2022-08-19 – 2022-08-23 (×3): 100 mg
  Filled 2022-08-19 (×4): qty 10

## 2022-08-19 MED ORDER — PROPOFOL 1000 MG/100ML IV EMUL
0.0000 ug/kg/min | INTRAVENOUS | Status: DC
  Administered 2022-08-19: 5 ug/kg/min via INTRAVENOUS
  Administered 2022-08-20: 30 ug/kg/min via INTRAVENOUS
  Administered 2022-08-20: 45 ug/kg/min via INTRAVENOUS
  Administered 2022-08-20: 50 ug/kg/min via INTRAVENOUS
  Administered 2022-08-20 (×2): 40 ug/kg/min via INTRAVENOUS
  Administered 2022-08-21 (×2): 50 ug/kg/min via INTRAVENOUS
  Administered 2022-08-21: 30 ug/kg/min via INTRAVENOUS
  Administered 2022-08-21 – 2022-08-22 (×2): 20 ug/kg/min via INTRAVENOUS
  Administered 2022-08-22: 45 ug/kg/min via INTRAVENOUS
  Administered 2022-08-22: 40 ug/kg/min via INTRAVENOUS
  Administered 2022-08-23: 30 ug/kg/min via INTRAVENOUS
  Administered 2022-08-23: 40 ug/kg/min via INTRAVENOUS
  Administered 2022-08-24: 10 ug/kg/min via INTRAVENOUS
  Filled 2022-08-19 (×17): qty 100

## 2022-08-19 MED ORDER — IOHEXOL 350 MG/ML SOLN
75.0000 mL | Freq: Once | INTRAVENOUS | Status: AC | PRN
  Administered 2022-08-19: 75 mL via INTRAVENOUS

## 2022-08-19 MED ORDER — FENTANYL CITRATE PF 50 MCG/ML IJ SOSY
25.0000 ug | PREFILLED_SYRINGE | INTRAMUSCULAR | Status: DC | PRN
  Administered 2022-08-19 – 2022-08-20 (×2): 100 ug via INTRAVENOUS
  Administered 2022-08-20: 50 ug via INTRAVENOUS
  Administered 2022-08-20: 100 ug via INTRAVENOUS
  Administered 2022-08-20: 25 ug via INTRAVENOUS
  Administered 2022-08-21 – 2022-08-23 (×6): 50 ug via INTRAVENOUS
  Filled 2022-08-19: qty 1
  Filled 2022-08-19: qty 2
  Filled 2022-08-19: qty 1
  Filled 2022-08-19: qty 2
  Filled 2022-08-19 (×2): qty 1
  Filled 2022-08-19: qty 2
  Filled 2022-08-19 (×5): qty 1

## 2022-08-19 MED ORDER — FAMOTIDINE 20 MG PO TABS
20.0000 mg | ORAL_TABLET | Freq: Two times a day (BID) | ORAL | Status: DC
  Administered 2022-08-19 – 2022-09-03 (×30): 20 mg
  Filled 2022-08-19 (×31): qty 1

## 2022-08-19 MED ORDER — DEXMEDETOMIDINE HCL IN NACL 400 MCG/100ML IV SOLN
INTRAVENOUS | Status: AC
  Filled 2022-08-19: qty 100

## 2022-08-19 MED ORDER — INSULIN ASPART 100 UNIT/ML IJ SOLN
0.0000 [IU] | INTRAMUSCULAR | Status: DC
  Administered 2022-08-19: 2 [IU] via SUBCUTANEOUS
  Administered 2022-08-21: 1 [IU] via SUBCUTANEOUS
  Administered 2022-08-21 (×3): 2 [IU] via SUBCUTANEOUS
  Administered 2022-08-21: 1 [IU] via SUBCUTANEOUS
  Administered 2022-08-21 (×2): 2 [IU] via SUBCUTANEOUS
  Administered 2022-08-22 (×2): 1 [IU] via SUBCUTANEOUS
  Administered 2022-08-22: 2 [IU] via SUBCUTANEOUS
  Administered 2022-08-22 (×2): 1 [IU] via SUBCUTANEOUS
  Administered 2022-08-22 – 2022-08-23 (×2): 2 [IU] via SUBCUTANEOUS
  Administered 2022-08-23: 1 [IU] via SUBCUTANEOUS
  Administered 2022-08-23: 3 [IU] via SUBCUTANEOUS

## 2022-08-19 MED ORDER — ENSURE ENLIVE PO LIQD: 237.0000 mL | Freq: Three times a day (TID) | ORAL | Status: DC

## 2022-08-19 MED ORDER — ROCURONIUM BROMIDE 10 MG/ML (PF) SYRINGE
PREFILLED_SYRINGE | INTRAVENOUS | Status: AC
  Administered 2022-08-19: 100 mg
  Filled 2022-08-19: qty 10

## 2022-08-19 MED ORDER — LACTATED RINGERS IV BOLUS
250.0000 mL | Freq: Once | INTRAVENOUS | Status: AC
  Administered 2022-08-19: 250 mL via INTRAVENOUS

## 2022-08-19 MED ORDER — LACTATED RINGERS IV SOLN: INTRAVENOUS | Status: DC

## 2022-08-19 MED ORDER — MIDAZOLAM HCL 2 MG/2ML IJ SOLN
INTRAMUSCULAR | Status: AC
  Administered 2022-08-19: 2 mg
  Filled 2022-08-19: qty 2

## 2022-08-19 MED ORDER — METRONIDAZOLE 500 MG/100ML IV SOLN
500.0000 mg | Freq: Two times a day (BID) | INTRAVENOUS | Status: DC
  Administered 2022-08-19 – 2022-08-23 (×10): 500 mg via INTRAVENOUS
  Filled 2022-08-19 (×10): qty 100

## 2022-08-19 MED ORDER — ETOMIDATE 2 MG/ML IV SOLN
INTRAVENOUS | Status: AC
  Administered 2022-08-19: 20 mg
  Filled 2022-08-19: qty 20

## 2022-08-19 MED ORDER — VANCOMYCIN HCL 750 MG/150ML IV SOLN
750.0000 mg | INTRAVENOUS | Status: DC
  Administered 2022-08-19: 750 mg via INTRAVENOUS
  Filled 2022-08-19 (×2): qty 150

## 2022-08-19 MED ORDER — SODIUM CHLORIDE 0.9 % IV SOLN
2.0000 g | Freq: Two times a day (BID) | INTRAVENOUS | Status: DC
  Administered 2022-08-19 – 2022-08-20 (×2): 2 g via INTRAVENOUS
  Filled 2022-08-19 (×2): qty 12.5

## 2022-08-19 MED ORDER — FENTANYL CITRATE PF 50 MCG/ML IJ SOSY
PREFILLED_SYRINGE | INTRAMUSCULAR | Status: AC
  Administered 2022-08-19: 50 ug
  Filled 2022-08-19: qty 2

## 2022-08-19 MED ORDER — FAMOTIDINE IN NACL 20-0.9 MG/50ML-% IV SOLN: 20.0000 mg | Freq: Two times a day (BID) | INTRAVENOUS | Status: DC

## 2022-08-19 MED ORDER — SODIUM CHLORIDE 0.9 % IV BOLUS
500.0000 mL | Freq: Once | INTRAVENOUS | Status: AC
  Administered 2022-08-19: 500 mL via INTRAVENOUS

## 2022-08-19 MED ORDER — POLYETHYLENE GLYCOL 3350 17 G PO PACK
17.0000 g | PACK | Freq: Every day | ORAL | Status: DC
  Administered 2022-08-21: 17 g
  Filled 2022-08-19 (×4): qty 1

## 2022-08-19 NOTE — Consult Note (Addendum)
NAME:  [name], MRN:  [id number], DOB:  [birth year], LOS: 72 ADMISSION DATE:  08/09/2022, CONSULTATION DATE:  08/19/22 REFERRING MD:  Darrick Meigs - TRH, CHIEF COMPLAINT:  SOB, AMS  History of Present Illness:  67 yo F PMH mild cognitive decline, bipolar disorder, recurrent UTIs, scoliosis with prior lumbar fusion who was admitted 1/22 for complex lumbar wound / visible Lspine hardware.   Has been admitted twice recently prior to current admission -- first being for eval of back pain and was found to have unstable fx through prior lumbar fusion, and underwent PLIF L1-L5  12/9 ( Admitted 12/9-12/21 and dc to SNF.  At SNF wound dehisced and pt presented back to ED same day and was readmitted 12/21-12/29 (second admission) then dc back to SNF.  Her complex lumbar wound further deteriorated at SNF and she presented back to ED 1/22 with exposed lumbar hardware and was admitted (current admission)   She went to OR 1/22 for revision of L1-L5 fusion, removal of L L1-L5 screws and rod, and removal of R L1 screw, revision of L1-L5 posterior instrumented fusion. She has been on doxy starting 1/24.  Current admission course has been c/b encephalopathy which initially seemed r/t medications, then thought possible UTI, and Cefadroxil was added 1/26.  CNS depressing medications were also de-escalated. Despite these measures, Pt become increasingly encephalopathic. TRH was consulted 1/30 in this setting. She started spiking low grade temps 1/30 (100.7), and became febrile to 103 on 1/31 at which point abx were changed to vanc zosyn.  Concomitantly, pt has had incr SOB and tachycardia. There is concern for possible team by primary and CTA chest has been ordered as well as CT L spine.   [name] and ID were consulted 2/1 in this setting    Pertinent  Medical History  Bipolar disorder Mild cog impairment  Recurrent UTI Complex Lumbar spine hx   Significant Hospital Events: Including procedures, antibiotic start  and stop dates in addition to other pertinent events   12/9 PLIF for unstable L fx through prior L spine fusion. Admitted 12/9-12/21, dc to SNF 12/21 12/21 readmitted 12/21-12/29 for wound dehiscence and dc back to SNF 1/22 readmitted for exposed L hardware. OR for removal of L L1-L5 screws rod and R L1 screw. Doxy  1/23 confusion  1/25 cipro 1/26 cipro end, start cefadroxil for possible UTI.  1/30 new leukocytosis, ongoing confusion, fever. TRH consult 1/31 worse fever and confusion. Abx change to vanc zosyn 2/1 worse SOB tachycardia. In the morning had better mentation then worse confusion in the afternoon. WBC up to 17.5 [name] and ID consults. Abx changed to vanc cefepime flagyl. Will move to ICU.   Interim History / Subjective:  Encephalopathic  WBC up to 17   Id chagned abx to vanc cefepime flagyl    Nb-- 2/1 0400 CBC values not congruent with previous and follow up labs, likely lab error   Objective   Blood pressure (!) 130/91, pulse 87, temperature 98.2 F (36.8 C), temperature source Oral, resp. rate (!) 24, height 5' (1.524 m), weight 67.6 kg, SpO2 98 %.        Intake/Output Summary (Last 24 hours) at 08/19/2022 1558 Last data filed at 08/19/2022 1444 Gross per 24 hour  Intake 654.19 ml  Output 1300 ml  Net -645.81 ml   Filed Weights   08/06/22 1541 08/09/22 0931  Weight: 65.3 kg 67.6 kg    Examination: General: Ill appearing older adult F NAD  HENT: Tacky  mm Anicteric sclera  Lungs: CTAb. Unlabored on [facility]  Cardiovascular: tachycardic, cap refill slightly sluggish  Abdomen: soft ndnt + bowel sounds  Extremities: no acute joint deformity  Neuro: Frequent lip smacking movements. Responds "Pretty Good" "Yep" to questions inappropriately. Intermittently follows extremity commands. Cannot hold arms against gravity. Slight R gaze deviation though difficult to discern with poor cooperation.  GU: foley  Resolved Hospital Problem list     Assessment & Plan:   Acute  encephalopathy, favor septic etiology Concern for possible subclinical seizure ?Dehydration Underlying chronic mild cog deficit   Bipolar disorder -home psych meds were de-escalated to lamictal only due to current encephalopathy -underlying cog deficit seems incredibly mild, sounds like she has a good QOL and pretty independent prior to fall/fracture in December -Ddx for AMS -- greatest concern for is sepsis and seizures. Specifically worry for L spine infection or bacterial meningitis, but consider UTI as well w hx though given course seems less likely -hyponatremia is chronic since 2019, doubt contributing. Renal and hepatic fxn are normal. Not on a lot of CNS depressing meds at this point so doubt meds are AMS etiology but something to consider if nothing else is identified, and there have not been changes to psych meds to precipitate TD etc  P -sepsis as below -CT H CT L spine -STAT spot EEG eval for subclinical sz - delirium precautions - close airway monitoring  -Will make NPO   -pending imaging, EEG, possible neuro consult  -minimize CNS depressing meds as able  -adding some IVF   Sepsis - possible lumbar wound infection vs osteo, vs CNS infection, possible UTI Complex Lumbar wound (exposed hardware s/p removal of L L1-L5 screws, rod, R L1 screw on 1/22) -wound not felt to be infected on NSGY evals  P -Id following -vanc cefepime flagyl  -going for CT L spine and CT H -- notably expect to see some Lumbar fluid in post op pt. If these are not revealing of convincing source of infection/encephalopathy I do think MRI brain would be a prudent consideration +/-LP r/o meningitis. Do think she would need ETT for MRI. If clinically there is incr concern for bacterial meningitis, add decadron  -MAP goal > 65  -- if becomes hypotensive would start pressors early instead of trying robust volume resusc first.   Acute hypoxic respiratory failure -now 4L. With tachycardia, tachypnea, recent  surgeries -- query PE though seems less likely with vte ppx this admission and other reasons for SOB/tachycardia w sepsis P -Pulm hygiene, fq suctioning -O2 for SpO2 > 92 -- have room to wean now  -PRN CXR -defer CTA chest -- difficulty obtaining large enough IV and think it is more prudent to obtain CT H and CT L spine   Lactic acidosis NAGMA -follow -no bicarb right now -will give mIVF  Anemia Thrombocytosis Coagulopathy, mild  -think this is sepsis driven P -trend CBC coags.   Hyponatremia, hypovolemic  Hypokalemia  Borderline hypomagnesemia  P -giving small LR bolus then mIVF  -replace K, Mag  -trend   Hyperglycemia  -start SSI   Chronic HFrEF HTN CAD, non-obstructive  -coreg is ordered, dc if pressures become softer -cardiac monitoring  Best Practice (right click and "Reselect all SmartList Selections" daily)   Diet/type: NPO DVT prophylaxis: prophylactic heparin  GI prophylaxis: N/A Lines: N/A Foley:  Yes, and it is still needed Code Status:  full code Last date of multidisciplinary goals of care discussion [2/1 family updated at bedside -- Gb  and RG d/w adult children re ICU transfer, possible need for intubation and code status.]  Labs   CBC: Recent Labs  Lab 08/15/22 1558 08/17/22 1851 08/18/22 1545 08/19/22 0354 08/19/22 1051  WBC 6.8 12.7* 19.9* 6.2 17.5*  NEUTROABS 5.0  --   --   --  14.2*  HGB 8.2* 9.3* 10.2* 8.9* 9.3*  HCT 23.3* 27.6* 30.7* 29.5* 27.2*  MCV 89.3 92.0 94.8 89.7 93.5  PLT 357 614* 809* 112* 639*    Basic Metabolic Panel: Recent Labs  Lab 08/16/22 0804 08/16/22 2052 08/17/22 0410 08/18/22 1545 08/19/22 0354 08/19/22 1051  NA 130* 130* 128* 124* 131* 126*  K 2.3* 3.6 4.7 3.8 4.3 3.3*  CL 95* 94* 96* 93* 101 98  CO2 23 24 23  18* 19* 17*  GLUCOSE 82 137* 119* 245* 106* 238*  BUN <5* <5* <5* 6* 16 9  CREATININE 0.38* 0.35* 0.33* 0.42* 1.46* 0.44  CALCIUM 7.6* 7.4* 7.5* 7.8* 8.6* 7.6*  MG 1.4*  --  1.7  --   --    --    GFR: Estimated Creatinine Clearance: 58.5 mL/min (by C-G formula based on SCr of 0.44 mg/dL). Recent Labs  Lab 08/17/22 1851 08/17/22 1857 08/18/22 1545 08/18/22 1741 08/18/22 2024 08/19/22 0354 08/19/22 1051 08/19/22 1503  WBC 12.7*  --  19.9*  --   --  6.2 17.5*  --   LATICACIDVEN  --    < >  --  2.9* 2.5*  --  2.6* 3.4*   < > = values in this interval not displayed.    Liver Function Tests: Recent Labs  Lab 08/19/22 1051  AST 32  ALT 13  ALKPHOS 66  BILITOT 0.7  PROT 5.3*  ALBUMIN 2.0*   No results for input(s): "LIPASE", "AMYLASE" in the last 168 hours. Recent Labs  Lab 08/17/22 1525  AMMONIA 19    ABG    Component Value Date/Time   HCO3 20.4 08/18/2022 0653   ACIDBASEDEF 2.0 08/18/2022 0653   O2SAT 72.1 08/18/2022 0653     Coagulation Profile: Recent Labs  Lab 08/19/22 1051  INR 1.5*    Cardiac Enzymes: No results for input(s): "CKTOTAL", "CKMB", "CKMBINDEX", "TROPONINI" in the last 168 hours.  HbA1C: Hgb A1c MFr Bld  Date/Time Value Ref Range Status  07/08/2022 10:21 PM 6.0 (H) 4.8 - 5.6 % Final    Comment:    (NOTE)         Prediabetes: 5.7 - 6.4         Diabetes: >6.4         Glycemic control for adults with diabetes: <7.0   08/17/2016 04:19 PM 6.5 (H) 4.8 - 5.6 % Final    Comment:    (NOTE)         Pre-diabetes: 5.7 - 6.4         Diabetes: >6.4         Glycemic control for adults with diabetes: <7.0     CBG: Recent Labs  Lab 08/14/22 1549 08/16/22 0750  GLUCAP 115* 83    Review of Systems:   Unable to obtain due to encephalopathy   Past Medical History:  She,  has a past medical history of Anemia, Anxiety, Arthritis, Depression, GERD (gastroesophageal reflux disease), Heart murmur, High cholesterol, History of blood transfusion, History of hiatal hernia, Hypertension, Hyperthyroidism, Mild cognitive impairment (09/06/2018), Paroxysmal ventricular tachycardia (HCC), Type II diabetes mellitus (HCC), UTI (urinary tract  infection), and Ventricular septal defect.   Surgical  History:   Past Surgical History:  Procedure Laterality Date   ABDOMINAL HYSTERECTOMY     BACK SURGERY     CARDIAC CATHETERIZATION N/A 06/23/2015   Procedure: Left Heart Cath and Coronary Angiography;  Surgeon: Larey Dresser, MD;  Location: [facility];  Service: Cardiovascular;  Laterality: N/A;   CARDIAC CATHETERIZATION  1960   "VSD was so small; didn't need repaired"   EXAM UNDER ANESTHESIA WITH MANIPULATION OF HIP Right 06/02/2014   dr Rhona Raider   FRACTURE SURGERY     HERNIA REPAIR     HIP CLOSED REDUCTION Right 06/02/2014   Procedure: CLOSED MANIPULATION HIP;  Surgeon: Hessie Dibble, MD;  Location: [facility];  Service: Orthopedics;  Laterality: Right;   JOINT REPLACEMENT     JOINT REPLACEMENT     POSTERIOR LUMBAR FUSION 4 LEVEL N/A 06/26/2022   Procedure: Lumbar One To Lumbar Five Posterior Instrumented Fusion;  Surgeon: Judith Part, MD;  Location: [facility];  Service: Neurosurgery;  Laterality: N/A;   REFRACTIVE SURGERY Bilateral    SHOULDER ARTHROSCOPY Right    SHOULDER OPEN ROTATOR CUFF REPAIR Right    SPINAL FUSION  1996   "t10 down to my coccyx   SPINE HARDWARE REMOVAL     TOTAL ABDOMINAL HYSTERECTOMY     TOTAL HIP ARTHROPLASTY Right 05/10/2014   [place]      by dr Harrell Gave [place]   TOTAL KNEE ARTHROPLASTY Left    TOTAL SHOULDER ARTHROPLASTY Left 08/19/2016   Procedure: TOTAL SHOULDER ARTHROPLASTY;  Surgeon: Tania Ade, MD;  Location: [facility];  Service: Orthopedics;  Laterality: Left;  Left total shoulder replacement     Social History:   reports that she has been smoking cigarettes. She has never used smokeless tobacco. She reports that she does not drink alcohol and does not use drugs.   Family History:  Her family history includes Chorea in her maternal grandfather; Dementia in her father, maternal aunt, and maternal aunt; Heart attack in an other family member; Other in her mother; Stroke  (age of onset: 4) in her father.   Allergies No Known Allergies   Home Medications  Prior to Admission medications   Medication Sig Start Date End Date Taking? Authorizing Provider  acetaminophen (TYLENOL) 500 MG tablet Take 1,000 mg by mouth in the morning, at noon, and at bedtime. (0800, 1400 & 2000)   Yes [provider]  amLODipine (NORVASC) 5 MG tablet Take 5 mg by mouth in the morning. (0730) 10/01/19  Yes [provider]  carvedilol (COREG) 6.25 MG tablet Take 6.25 mg by mouth in the morning and at bedtime. (0730 & 2000)   Yes [provider]  cyclobenzaprine (FLEXERIL) 5 MG tablet Take 5 mg by mouth 3 (three) times daily. (0800, 1300 & 2000)   Yes [provider]  donepezil (ARICEPT) 10 MG tablet Take 10 mg by mouth daily.   Yes [provider]  fluvoxaMINE (LUVOX) 100 MG tablet Take 100 mg by mouth at bedtime.   Yes [provider]  gabapentin (NEURONTIN) 100 MG capsule Take 200 mg by mouth in the morning, at noon, and at bedtime. (0800, 1400 & 2000)   Yes [provider]  lamoTRIgine (LAMICTAL) 200 MG tablet Take 200 mg by mouth daily.   Yes [provider]  levothyroxine (SYNTHROID) 88 MCG tablet Take 88 mcg by mouth daily before breakfast. 09/11/20  Yes [provider]  loperamide (IMODIUM) 2 MG capsule Take 2-4 mg by mouth  See admin instructions. Take 1 capsule (2 mg) by mouth once daily scheduled & take 1-2 capsules (2-4 mg) by mouth once daily as needed for additional loose stools.   Yes [provider]  losartan (COZAAR) 100 MG tablet Take 100 mg by mouth at bedtime.   Yes [provider]  metFORMIN (GLUCOPHAGE) 500 MG tablet Take 500 mg by mouth in the morning and at bedtime. 02/10/21  Yes [provider]  Multiple Vitamin (MULTIVITAMIN) tablet Take 1 tablet by mouth daily.     Yes [provider]  ondansetron (ZOFRAN) 4 MG tablet Take 1 tablet (4 mg total) by mouth  every 6 (six) hours as needed for nausea or vomiting. 07/06/22  Yes Barnett Abu, MD  OZEMPIC, 2 MG/DOSE, 8 MG/3ML SOPN Inject 2 mg into the skin every Monday.   Yes [provider]  pantoprazole (PROTONIX) 40 MG tablet Take 80 mg by mouth at bedtime. 12/11/19  Yes [provider]  QUEtiapine (SEROQUEL) 300 MG tablet Take 600 mg by mouth at bedtime.   Yes [provider]  Semaglutide, 1 MG/DOSE, (OZEMPIC, 1 MG/DOSE,) 4 MG/3ML SOPN Inject 2 mg into the skin every Monday.   Yes [provider]  sodium chloride 1 g tablet Take 1 g by mouth every other day. (0900)   Yes [provider]  traMADol (ULTRAM) 50 MG tablet Take 25 mg by mouth every 6 (six) hours as needed (PAIN).   Yes [provider]  traZODone (DESYREL) 100 MG tablet Take 100 mg by mouth at bedtime. 05/07/20  Yes [provider]  buPROPion (WELLBUTRIN XL) 300 MG 24 hr tablet Take 300 mg by mouth in the morning.  (0730)    [provider]  nicotine polacrilex (NICORETTE) 2 MG gum Take 2 mg by mouth daily.    [provider]     Critical care time: 60 min    CRITICAL CARE Performed by: Lanier Clam   Total critical care time: 60 minutes  Critical care time was exclusive of separately billable procedures and treating other patients.  Critical care was necessary to treat or prevent imminent or life-threatening deterioration.  Critical care was time spent personally by me on the following activities: development of treatment plan with patient and/or surrogate as well as nursing, discussions with consultants, evaluation of patient's response to treatment, examination of patient, obtaining history from patient or surrogate, ordering and performing treatments and interventions, ordering and review of laboratory studies, ordering and review of radiographic studies, pulse oximetry and re-evaluation of patient's condition.  Tessie Fass MSN, AGACNP-BC [facility]  Pulmonary/Critical Care Medicine Amion for pager  08/19/2022, 3:58 PM

## 2022-08-19 NOTE — Progress Notes (Signed)
PT Cancellation Note  Patient Details Name: [name] MRN: [id number] DOB: 27-Oct-1955   Cancelled Treatment:    Reason Eval/Treat Not Completed: Patient not medically ready  RN reports tachypnic and going down for CT. Will return as medically appropriate.   [place]  Office [phone]  Rexanne Mano 08/19/2022, 2:40 PM

## 2022-08-19 NOTE — Progress Notes (Addendum)
CCM interim progress note  Remains quite encephalopathic after transfer to ICU. Reviewed CT H  CTA chest PE study and CT Lumbar spine (CT L spine and CTA chest were not ready at time of my conversation with family). Labs have resulted w elevated BNP to 1200, marginally elevated trop to 169 and worse lactic acidosis 3.4.  From my review of chest imaging, biggest concern is that she is aspirating. Bilateral ASD. Did not see an obvious PE.  CT H without acute intracranial process CT L spine -- I didn't see an abnormal fluid collection but again, awaiting rad reads.   Talked w family at bedside about aspiration concerns & poor airway protection in setting of encephalopathy. Extensively discussed this in detail w family, as well as intubation procedure in detail. EEG being performed & read as we were discussing, no evidence of sz. Also discussed code status with family -- full code.     Acute metabolic encephalopathy, favor sepsis. Seizures ruled out  Acute respiratory failure with hypoxia Aspiraiton Pna, bilateral lung Sepsis, unclear etiology --concern for CNS infection, possible wound infection, osteo. Aspiration PNA could certainly contribute to sepsis but timeline wise its hard to point to this as driver of sepsis if sepsis is causing encephalopathy.  Acute on chronic systolic HF  P -intubate, CXR ABG to follow -- usual VAP pulm hygiene -RASS goal 0 to -1 will do prop and PRN fent.  -f/u formal reads of Cts  -MRI Brain w/wo  -MRI Lumbar spine w/wo if Lumbar CT read is not revealing -cont broad abx  -will check NH4, TSH  -will recheck an ECHO this admission -- last was in 2020  -if hypotensive, early pressors. Would not pursue aggressive volume resusc  -will order EN per RDN  -will send RVP and tracheal aspirate for completeness      Additional critical care time 45 minutes   Eliseo Gum MSN, AGACNP-BC [facility] for pager  08/19/2022, 6:20  PM

## 2022-08-19 NOTE — Procedures (Signed)
Patient Name: CALLIA SWIM  MRN: [id number]  Epilepsy Attending: Lora Havens  Referring Physician/Provider: Cristal Generous, NP  Date: 08/19/2022 Duration: 24.34 mins  Patient history: 67yo F with ams. EEG to evaluate for seizure  Level of alertness: Awake  AEDs during EEG study: LTG, Propofol  Technical aspects: This EEG study was done with scalp electrodes positioned according to the 10-20 International system of electrode placement. Electrical activity was reviewed with band pass filter of 1-70Hz , sensitivity of 7 uV/mm, display speed of 32mm/sec with a 60Hz  notched filter applied as appropriate. EEG data were recorded continuously and digitally stored.  Video monitoring was available and reviewed as appropriate.  Description: EEG showed continuous generalized 3 to 6 Hz theta-delta slowing.  Hyperventilation and photic stimulation were not performed.     ABNORMALITY - Continuous slow, generalized  IMPRESSION: This study is suggestive of moderate diffuse encephalopathy, nonspecific etiology. No seizures or epileptiform discharges were seen throughout the recording.  Ismelda Weatherman Barbra Sarks

## 2022-08-19 NOTE — Progress Notes (Signed)
OT Cancellation Note  Patient Details Name: [name] MRN: [id number] DOB: 08/23/1955   Cancelled Treatment:    Reason Eval/Treat Not Completed: Patient not medically ready (RN reports tachypnic and going down for CT. Will return as medically appropriate.)  Elder Cyphers, OTR/L [facility] Acute Rehabilitation Office: [phone]   Magnus Ivan 08/19/2022, 2:19 PM

## 2022-08-19 NOTE — Progress Notes (Addendum)
Pharmacy Antibiotic Note- follow-up  [name] is a 67 y.o. female admitted on 08/09/2022 for revision of hardware and wound..  Pharmacy has been consulted for Vancomycin and Cefepime dosing  for sepsis/wound infection.  Scr is  0.33,, will use Scr 0.8 for dosing. BMI 29)   Plan: 08/19/2022 AM update: Scr has risen to 1.46 mg/dL Based on this information, I have reduced the maintenance dose of vanco to 750 mg iv q24h. Goal AUC and eAUC remains the same Goal AUC 400-550 mcg*hr/mL / eAUC: 455.7 mcg*hr/mL  DC zosyn Start cefepime 2 grams iv q12h Flagyl 500 mg iv q12h  Monitor clinical status, renal function and culture results daily.  Vanc levels at [facility] as needed per protocol.    Height: 5' (152.4 cm) Weight: 67.6 kg (149 lb) IBW/kg (Calculated) : 45.5  Temp (24hrs), Avg:101.3 F (38.5 C), Min:98.3 F (36.8 C), Max:103.3 F (39.6 C)  Recent Labs  Lab 08/12/22 1118 08/13/22 0747 08/15/22 1558 08/16/22 0804 08/16/22 2052 08/17/22 0410 08/17/22 1851 08/17/22 1857 08/18/22 1545 08/18/22 1741 08/18/22 2024 08/19/22 0354  WBC 6.7  --  6.8  --   --   --  12.7*  --  19.9*  --   --  6.2  CREATININE 0.36*   < > 0.31* 0.38* 0.35* 0.33*  --   --  0.42*  --   --  1.46*  LATICACIDVEN  --   --   --   --   --   --   --  1.3  --  2.9* 2.5*  --    < > = values in this interval not displayed.     Estimated Creatinine Clearance: 32.1 mL/min (A) (by C-G formula based on SCr of 1.46 mg/dL (H)).    No Known Allergies  Antimicrobials this admission: Cipro 1/25 >>1/26  Doxy 1/23 >>1/31 Cefadroxil 1/26 >>1/31 Vancomycin 1/31>> Zosyn 1/31>> 2/1 Cefepime 2/1 >>    Dose adjustments this admission: n/a  Microbiology results: 1/22 MRSA PCR: neg;   SA: positive 1/28 C.diff - negative 1/30 BCx x2: ngtd day 2 1/30 BCx x2: ngtd day 2  Thank you for allowing pharmacy to be a part of this patient's care.   Vaughan Basta BS, PharmD, BCPS Clinical Pharmacist 08/19/2022 11:19  AM  Contact: [phone] after 3 PM  "Be curious, not judgmental..." -Jamal Maes

## 2022-08-19 NOTE — Progress Notes (Signed)
EEG complete - results pending 

## 2022-08-19 NOTE — Progress Notes (Signed)
STAT EEG-results pending

## 2022-08-19 NOTE — Progress Notes (Signed)
Pt increasingly tachyphenic and having increased O2 demands. RN notified Dr. Darrick Meigs and he stated he is calling CCM. Rn will continue to monitor patient and await further orders.

## 2022-08-19 NOTE — Progress Notes (Signed)
Patient developed fever again, 102 axillary around 0215. Tylenol suppository was given at 0220, and temp now is 103. Pt developed labor breathing, RR 27, HR 127, SpO2 95 on 2L O2.  Bridgett Larsson, MD notified. MD requested morning labs to be collected now, and new order for IVF was placed.  Lab notified.

## 2022-08-19 NOTE — Progress Notes (Signed)
Initial Nutrition Assessment  DOCUMENTATION CODES:  Not applicable  INTERVENTION:  Continue regular diet  Change to automatic trays Ensure Enlive po TID, each supplement provides 350 kcal and 20 grams of protein.  MVI with minerals daily Magic cup TID with meals, each supplement provides 290 kcal and 9 grams of protein  NUTRITION DIAGNOSIS:   Increased nutrient needs related to wound healing as evidenced by estimated needs.  GOAL:   Patient will meet greater than or equal to 90% of their needs  MONITOR:   PO intake, Supplement acceptance, Weight trends, Labs  REASON FOR ASSESSMENT:   Consult Poor PO, Wound healing, Assessment of nutrition requirement/status  ASSESSMENT:   Pt with hx of GERD, HLD, HTN, and DM type 2 presented to ED for planned revision of lumbar surgery after previous was not healing and had exposed hardware.  Pt in room with MD going over plan at the time of assessment. Family members waiting in hallway to go in - pt going for CT this afternoon. Will attempt to obtain nutrition hx and physical exam at follow-up assessment. Poor intake of meals so far recorded in flowsheet. Discussed with RN, states pt is going to be transferred to ICU.  Will change to ordering assist, add nutrition supplement and MVI to support wound healing.  Average Meal Intake: 1/25-1/28: 15% intake x 3 recorded meals  Nutritionally Relevant Medications: Scheduled Meds:  multivitamin with minerals  1 tablet Oral Daily   pantoprazole  80 mg Oral QHS   Continuous Infusions:  piperacillin-tazobactam (ZOSYN)  IV 3.375 g (08/19/22 0222)   PRN Meds: ondansetron, polyethylene glycol  Labs Reviewed: Na 126 K 3.3 Creatinine 1.46  NUTRITION - FOCUSED PHYSICAL EXAM: Defer to follow-up assessment  Diet Order:   Diet Order             Diet regular Room service appropriate? Yes; Fluid consistency: Thin  Diet effective now                   EDUCATION NEEDS:  Not appropriate  for education at this time  Skin:  Skin Assessment: Reviewed RN Assessment (Stage 2 to the sacrum)  Last BM:  1/31  Height:  Ht Readings from Last 1 Encounters:  08/09/22 5' (1.524 m)    Weight:  Wt Readings from Last 1 Encounters:  08/09/22 67.6 kg    Ideal Body Weight:  45.5 kg  BMI:  Body mass index is 29.1 kg/m.  Estimated Nutritional Needs:  Kcal:  1600-1800 kcal/d Protein:  85-100 g/d Fluid:  1.8L/d    Ranell Patrick, RD, LDN Clinical Dietitian RD pager # available in [place]  After hours/weekend pager # available in [facility]

## 2022-08-19 NOTE — Progress Notes (Signed)
[facility]  PROGRESS NOTE  [name]:[id number] DOB: 02/01/1956 DOA: 08/09/2022 PCP: Marissa Macadam, MD   Brief HPI:   67 year old female with medical history of mild cognitive impairment, on Aricept, chronic HFrEF with EF 45 to 50%, nonobstructive CAD, small VSD, hypertension, diabetes mellitus type 2 came to hospital for dehiscence wound on her back with underlying rib fracture and baseline lower segment scoliosis construct, underwent revision of complex lumbar wound and removal of screws and rod involving L1-L5 on August 09, 2022, hospitalist team was consulted for mentation changes. As per patient started after patient came out of surgery last Monday she had episode of confusion and also had tremors which was new.  At baseline patient has mild cognitive impairment and has been following with neurologist and has been taking Aricept.  At baseline patient is alert, oriented x 3 and active.  3 days ago patient developed watery diarrhea for which C. difficile test was done which came back negative.  She also has indwelling Foley catheter and has been started empirically on cefadroxil for UTI since 08/13/2022.    Subjective   Patient seen and examined, became septic yesterday, lactic acid was elevated at 2.9 with WBC 19.9.  Patient was febrile with Tmax 102 F.  She was started on vancomycin and Zosyn and transferred to stepdown unit. This morning patient is much more alert, communicating well.   Assessment/Plan:    Sepsis -Patient became tachypneic, tachycardic, febrile with altered mental status -Concern for developing early sepsis, also concern for developing pulmonary edema -Patient had bilateral rhonchi and was tachypneic -Patient blood pressure was  stable, she has been getting cefadroxil for UTI -CBC obtained today shows WBC 19.9 -Will start vancomycin and Zosyn empirically for early sepsis -Lactic acid 2.9 > 2.5 -Avoid excessive IV fluids as patient was  tachypneic with bilateral rhonchi, developing fluid overload -Blood cultures x 2 were obtained yesterday, urine culture was not obtained -Follow culture results -Unclear source of infection, chest x-ray showed no pneumonia this morning, UA was abnormal but patient has chronic indwelling Foley catheter -Patient has wound dehiscence with serosanguineous discharge as per wound care RN -Likely source of infection is the wound in the back -Will continue with antibiotics at this time  Metabolic encephalopathy -Mental status is slowly improving -Patient takes multiple psychotropic medications at home - fluvoxamine, Wellbutrin were discontinued - dose of gabapentin was cut down to 100 mg p.o. 3 times daily -Started on antibiotics as above  Complicated UTI -Patient has chronic indwelling Foley catheter -Started on antibiotics as above  Hyponatremia -Acute on chronic -Patient baseline sodium is 1 26-1 32  Left lower lobe atelectasis -Encourage incentive spirometry  Hypertension -Continue Coreg -Will hold amlodipine, losartan  Acute kidney injury -Creatinine is up to 1.46 -Likely in setting of sepsis -Follow BMP in a.m., if creatinine continues to rise consider renal ultrasound  History of mood disorder -Continue lamotrigine  Surgical wound dehiscence s/p incision and drainage and hardware removal -Neurosurgery did not feel that the wound was infected -Patient started on antibiotics as above due to rising WBC, fever     Medications     carvedilol  6.25 mg Oral BID WC   Chlorhexidine Gluconate Cloth  6 each Topical Daily   donepezil  10 mg Oral Daily   gabapentin  100 mg Oral TID   heparin  5,000 Units Subcutaneous Q8H   lamoTRIgine  200 mg Oral Daily   levothyroxine  88 mcg Oral QAC breakfast   multivitamin  with minerals  1 tablet Oral Daily   nicotine polacrilex  2 mg Oral Daily   pantoprazole  80 mg Oral QHS   sodium chloride flush  3 mL Intravenous Q12H   sodium  chloride  1 g Oral BID WC     Data Reviewed:   CBG:  Recent Labs  Lab 08/14/22 1549 08/16/22 0750  GLUCAP 115* 83    SpO2: 95 % O2 Flow Rate (L/min): 2 L/min    Vitals:   08/19/22 0500 08/19/22 0539 08/19/22 0637 08/19/22 0741  BP:   (!) 131/97 (!) 142/100  Pulse:    (!) 120  Resp: (!) 29 (!) 28  20  Temp:  (!) 100.7 F (38.2 C) (!) 102 F (38.9 C) 98.8 F (37.1 C)  TempSrc:  Axillary Axillary Oral  SpO2: 97%  98% 95%  Weight:      Height:          Data Reviewed:  Basic Metabolic Panel: Recent Labs  Lab 08/16/22 0804 08/16/22 2052 08/17/22 0410 08/18/22 1545 08/19/22 0354  NA 130* 130* 128* 124* 131*  K 2.3* 3.6 4.7 3.8 4.3  CL 95* 94* 96* 93* 101  CO2 23 24 23  18* 19*  GLUCOSE 82 137* 119* 245* 106*  BUN <5* <5* <5* 6* 16  CREATININE 0.38* 0.35* 0.33* 0.42* 1.46*  CALCIUM 7.6* 7.4* 7.5* 7.8* 8.6*  MG 1.4*  --  1.7  --   --     CBC: Recent Labs  Lab 08/12/22 1118 08/15/22 1558 08/17/22 1851 08/18/22 1545 08/19/22 0354  WBC 6.7 6.8 12.7* 19.9* 6.2  NEUTROABS  --  5.0  --   --   --   HGB 8.4* 8.2* 9.3* 10.2* 8.9*  HCT 24.1* 23.3* 27.6* 30.7* 29.5*  MCV 91.6 89.3 92.0 94.8 89.7  PLT 288 357 614* 809* 112*    LFT Recent Labs  Lab 08/12/22 1118  AST 23  ALT 12  ALKPHOS 69  BILITOT 0.4  PROT 5.0*  ALBUMIN 2.0*     Antibiotics: Anti-infectives (From admission, onward)    Start     Dose/Rate Route Frequency Ordered Stop   08/19/22 1800  vancomycin (VANCOCIN) IVPB 1000 mg/200 mL premix        1,000 mg 200 mL/hr over 60 Minutes Intravenous Every 24 hours 08/18/22 1628     08/18/22 1715  piperacillin-tazobactam (ZOSYN) IVPB 3.375 g        3.375 g 12.5 mL/hr over 240 Minutes Intravenous Every 8 hours 08/18/22 1619     08/18/22 1715  vancomycin (VANCOREADY) IVPB 1500 mg/300 mL        1,500 mg 150 mL/hr over 120 Minutes Intravenous  Once 08/18/22 1628 08/18/22 1938   08/13/22 1000  cefadroxil (DURICEF) capsule 1,000 mg  Status:   Discontinued        1,000 mg over 6 Days Oral 2 times daily 08/13/22 0838 08/18/22 1611   08/12/22 2000  ciprofloxacin (CIPRO) tablet 500 mg  Status:  Discontinued        500 mg Oral 2 times daily 08/12/22 1709 08/13/22 0839   08/10/22 1500  doxycycline (VIBRA-TABS) tablet 100 mg  Status:  Discontinued        100 mg Oral Every 12 hours 08/10/22 1410 08/18/22 1502   08/09/22 2100  ceFAZolin (ANCEF) IVPB 2g/100 mL premix        2 g 200 mL/hr over 30 Minutes Intravenous Every 8 hours 08/09/22 1704 08/10/22 0547   08/09/22  1030  ceFAZolin (ANCEF) IVPB 2g/100 mL premix        2 g 200 mL/hr over 30 Minutes Intravenous On call to O.R. 08/09/22 1019 08/09/22 1315   08/09/22 1022  ceFAZolin (ANCEF) 2-4 GM/100ML-% IVPB       Note to Pharmacy: Marlena Clipper S: cabinet override      08/09/22 1022 08/09/22 1136          Objective    Physical Examination:   Appears in no acute distress S1-S2, regular Clear to auscultation bilaterally Abdomen is soft, nontender, no organomegaly      Pressure Injury 08/18/22 Coccyx Mid Stage 2 -  Partial thickness loss of dermis presenting as a shallow open injury with a red, pink wound bed without slough. (Active)  08/18/22 2100  Location: Coccyx  Location Orientation: Mid  Staging: Stage 2 -  Partial thickness loss of dermis presenting as a shallow open injury with a red, pink wound bed without slough.  Wound Description (Comments):   Present on Admission: Yes        [place]   [facility] If 7PM-7AM, please contact night-coverage at [email or web address], Office  [phone]   08/19/2022, 8:15 AM  LOS: 10 days

## 2022-08-19 NOTE — Progress Notes (Signed)
Pt transferred to ICU, 416N. And handed off to ICU nurse and team. All questions answered and patient family also aware of transfer.

## 2022-08-19 NOTE — Progress Notes (Signed)
Spoke with RN to coordinate a good time for EEG pt is going to CT after she will be transferring to 4N

## 2022-08-19 NOTE — Progress Notes (Signed)
Bridgett Larsson, MD notified about elevated lactic acid. No need to repeat since it is trending down.

## 2022-08-19 NOTE — Procedures (Signed)
Intubation Procedure Note  [name]  [id number]  [birth year]  Date:08/19/22  Time:6:33 PM   Provider Performing:Aztlan Coll    Procedure: Intubation (31500)  Indication(s) Respiratory Failure  Consent Risks of the procedure as well as the alternatives and risks of each were explained to the patient and/or caregiver.  Consent for the procedure was obtained and is signed in the bedside chart  Anesthesia Etomidate, Versed, Fentanyl, and Rocuronium  Time Out Verified patient identification, verified procedure, site/side was marked, verified correct patient position, special equipment/implants available, medications/allergies/relevant history reviewed, required imaging and test results available.  Sterile Technique Usual hand hygeine, masks, and gloves were used  Procedure Description Patient positioned in bed supine.  Sedation given as noted above.  Patient was intubated with endotracheal tube using 3 Glidescope.  View was Grade 1 full glottis .  Number of attempts was 1.  7.8mm ETT. Colorimetric CO2 detector was consistent with tracheal placement.  Complications/Tolerance None; patient tolerated the procedure well. Chest X-ray is ordered to verify placement.  EBL none  Specimen(s) None   Kipp Brood, MD [facility] ICU Physician [place]  Pager: [phone] Or Epic Secure Chat After hours: [phone].  08/19/2022, 6:33 PM

## 2022-08-19 NOTE — Progress Notes (Signed)
Neurosurgery Service Progress Note  Subjective: Worsening fever overnight, tachypnea and tachycardia, worsening confusion  Objective: Vitals:   08/19/22 0539 08/19/22 0637 08/19/22 0741 08/19/22 0926  BP:  (!) 131/97 (!) 142/100 (!) 132/97  Pulse:   (!) 120 (!) 120  Resp: (!) 28  20 (!) 26  Temp: (!) 100.7 F (38.2 C) (!) 102 F (38.9 C) 98.8 F (37.1 C) 99.2 F (37.3 C)  TempSrc: Axillary Axillary Oral Axillary  SpO2:  98% 95% 95%  Weight:      Height:        Physical Exam: Awake/alert, Ox3, strength 5/5x4, incision c/d/I with prevena, canister with some bloody drainage, no purulence, stable mild erythema, no decubitus ulcer but does have some intertriginous erythema around the perineum, breathing O2 via [place] with some increased WOB and upper respiratory secretions  Assessment & Plan: 67 y.o. female s/p revision of complex lumbar wound, removal of left L1-L5 lumbar screws and rod, removal of right L1 screw, revision of L1-L5 posterior instrumented fusion, recovering well. DVT U/S neg, hypokalemia, hyponatremia   -updated family at bedside and discussed the above -medicine recs -family asked re lumbar CT. I'm fine with getting this, her wound has been draining some but does not appear infected. The CT will show a post-op fluid collection, but could potentially show some other signs of infection like osteomyelitis that could change antibiotic duration / etc.  -lab draw soon, given how dramatically her labs changed, will add on repeats -SCDs / TEDs / SQH 5000 mg TID    Judith Part, MD

## 2022-08-19 NOTE — Progress Notes (Signed)
   08/19/22 0000  Assess: MEWS Score  Temp (!) 101.9 F (38.8 C)  BP 116/82  Pulse Rate (!) 114  SpO2 97 %  O2 Device Nasal Cannula  O2 Flow Rate (L/min) 2 L/min  Assess: MEWS Score  MEWS Temp 2  MEWS Systolic 0  MEWS Pulse 2  MEWS RR 1  MEWS LOC 1  MEWS Score 6  MEWS Score Color Red  Assess: if the MEWS score is Yellow or Red  Were vital signs taken at a resting state? Yes  Focused Assessment Change from prior assessment (see assessment flowsheet)  Does the patient meet 2 or more of the SIRS criteria? Yes  Does the patient have a confirmed or suspected source of infection? No  MEWS guidelines implemented *See Row Information* No, previously red, continue vital signs every 4 hours  Treat  MEWS Interventions Administered prn meds/treatments  Escalate  MEWS: Escalate Red: discuss with charge nurse/RN and provider, consider discussing with RRT  Assess: SIRS CRITERIA  SIRS Temperature  1  SIRS Pulse 1  SIRS Respirations  1  SIRS WBC 1  SIRS Score Sum  4

## 2022-08-19 NOTE — Progress Notes (Signed)
   08/18/22 2059  Assess: MEWS Score  Temp (!) 103.3 F (39.6 C)  BP 123/84  MAP (mmHg) 96  Pulse Rate (!) 129  Resp 20  SpO2 98 %  Assess: MEWS Score  MEWS Temp 2  MEWS Systolic 0  MEWS Pulse 2  MEWS RR 0  MEWS LOC 0  MEWS Score 4  MEWS Score Color Red  Assess: if the MEWS score is Yellow or Red  Were vital signs taken at a resting state? Yes  Focused Assessment Change from prior assessment (see assessment flowsheet)  Does the patient meet 2 or more of the SIRS criteria? Yes  Does the patient have a confirmed or suspected source of infection? No  MEWS guidelines implemented *See Row Information* Yes  Treat  MEWS Interventions Administered prn meds/treatments;Escalated (See documentation below);Other (Comment)  Take Vital Signs  Increase Vital Sign Frequency  Red: Q 1hr X 4 then Q 4hr X 4, if remains red, continue Q 4hrs  Escalate  MEWS: Escalate Red: discuss with charge nurse/RN and provider, consider discussing with RRT  Notify: Charge Nurse/RN  Name of Charge Nurse/RN Notified Manuela Schwartz, RN  Date Charge Nurse/RN Notified 08/18/22  Time Charge Nurse/RN Notified 2100  Provider Notification  Provider Name/Title Bridgett Larsson, MD  Date Provider Notified 08/18/22  Time Provider Notified 2130  Method of Notification Page  Notification Reason Change in status  Provider response See new orders  Date of Provider Response 08/18/22  Time of Provider Response 2130  Document  Patient Outcome Other (Comment) (improving)  Progress note created (see row info) Yes  Assess: SIRS CRITERIA  SIRS Temperature  1  SIRS Pulse 1  SIRS Respirations  0  SIRS WBC 0  SIRS Score Sum  2   Pt transferred from 6N, MD aware. Pt in no acute distress.  Slowly responding to treatment, MD updated.

## 2022-08-19 NOTE — Consult Note (Addendum)
Date of Admission:  08/09/2022          Reason for Consult: FUO    Referring Provider: Eleonore Chiquito, MD   Assessment:  "FUO" with septic physiology History of L1 L5 fracture stabilization and posterolateral instrumentation fusion on June 26, 5034 complicated by Wound dehiscence with exposed hardware and unstable lumbar spine fracture status post revision of lumbar wound with removal of L1-3 4 and 5 lumbar screws and rod and removal of right L1 screw and revision of L1-L5 posterior instrumented fusion on August 09, 2022 and now concern for postoperative wound infection Tachypnea Elevated diaphgram? Damage to phrenic nerve Indwelling foley catheter and hx of UTI's Dementia DM VSD CAD  Plan:  CTA Consider ABG, CCM consult CT lumbar spine with contrast Followup blood cultures Change antibiotics to vancomycin cefepime and metronidazole to reduce nephrotoxic risk She may need CT abdomen and pelvis as part of FUO workup Will ensure HIV and viral hepatides screened but not do full FUO workup since this is undoubtedly related to her hospitalization   Principal Problem:   Wound dehiscence   Scheduled Meds:  carvedilol  6.25 mg Oral BID WC   Chlorhexidine Gluconate Cloth  6 each Topical Daily   donepezil  10 mg Oral Daily   gabapentin  100 mg Oral TID   heparin  5,000 Units Subcutaneous Q8H   lamoTRIgine  200 mg Oral Daily   levothyroxine  88 mcg Oral QAC breakfast   multivitamin with minerals  1 tablet Oral Daily   nicotine polacrilex  2 mg Oral Daily   pantoprazole  80 mg Oral QHS   sodium chloride flush  3 mL Intravenous Q12H   sodium chloride  1 g Oral BID WC   Continuous Infusions:  ceFEPime (MAXIPIME) IV 2 g (08/19/22 1245)   metronidazole 500 mg (08/19/22 1330)   sodium chloride     vancomycin     PRN Meds:.acetaminophen **OR** acetaminophen, diphenoxylate-atropine, menthol-cetylpyridinium **OR** phenol, ondansetron **OR** ondansetron (ZOFRAN) IV,  oxyCODONE, polyethylene glycol, sodium chloride flush  HPI: [name] is a 67 y.o. female with history of heart failure nonobstructive coronary disease ventral septal defect diabetes mellitus hypertension who had developed an L1-L5 fracture that was closed but unstable.  She underwent surgery on June 26, 2022 with stabilization of the fracture and posterolateral instrumentation of the fusion.  Her postoperative course was complicate by wound dehiscence with frank exposure of hardware which her daughter showed me on photo.  Patient was brought back and underwent revision of her complex lumbar wound with removal of her left sided L1-3 4 and 5 lumbar screws and rod in its entirety as well as removal of right L1 screw and revision of the L1-L5 posterior instrumented fusion.  He was given doxycycline postoperatively if I understand correctly as a preventative antibiotic per the family.  Later on the 25th she was begun on ciprofloxacin based on some pyuria though there is not any clear symptoms and the culture does not appear to have been done.  She had had a temperature up to 100.1 at that time.  Cefadroxil was begun on the 26th and continued along with doxycycline in the interim the patient developed fevers on the 30th that were more low-grade up to 100-101 but in the last 24 hours have peaked at 103.3.  Yesterday she had become encephalopathic and there was concern for increasing septic physiology.  Blood cultures were taken on the 30th she was broadened  yesterday to vancomycin and Zosyn.  She apparently appears better today than yesterday.  On exam today she appears uncomfortable and is quite tachypneic using accessory muscles to breathe.  She does not provide much in way of the history due to her dementia and her current clinical status.  2 daughters at the bedside to provide much of the history.  Even her recent spinal surgeries x 2 I am obviously concerned that she may have a  postoperative infection that may require further debridement which point time cultures can be obtained.  I am also bothered by her respiratory status.  We have ordered a CT angiogram of the lungs and a CT of the lumbar spine with contrast for now.  We will change her antibiotics to vancomycin cefepime and metronidazole given the synergistic nephrotoxicity of vancomycin and Zosyn.  Will follow-up blood cultures and consider ID of CT abdomen pelvis if CT lumbar spine and CT angiogram are on fruitful.  I spent 90 minutes with the patient including than 50% of the time in face to face counseling of the patient and her daughters re her FUO wit concern for postop infection, personally reviewing CXR labs, along with review of medical records in preparation for the visit and during the visit and in coordination of her care.     Review of Systems: Review of Systems  Unable to perform ROS: Dementia    Past Medical History:  Diagnosis Date   Anemia    Anxiety    Arthritis    Depression    GERD (gastroesophageal reflux disease)    Heart murmur    "related to VSD"   High cholesterol    History of blood transfusion    "related to OR" (08/19/2016)   History of hiatal hernia    Hypertension    Hyperthyroidism    Mild cognitive impairment 09/06/2018   Paroxysmal ventricular tachycardia (HCC)    Type II diabetes mellitus (HCC)    UTI (urinary tract infection)    being treated with Keflex   Ventricular septal defect     Social History   Tobacco Use   Smoking status: Every Day    Years: 50.00    Types: Cigarettes   Smokeless tobacco: Never   Tobacco comments:    03/30/21 smokes 2 cigs daily.      08/06/22 Shanda Bumps stated that the patient is not smoking while at [facility] for rehab. KM  Vaping Use   Vaping Use: Never used  Substance Use Topics   Alcohol use: No   Drug use: No    Family History  Problem Relation Age of Onset   Other Mother        alive   Stroke Father 3        deceased   Dementia Father    Chorea Maternal Grandfather    Dementia Maternal Aunt    Dementia Maternal Aunt    Heart attack Other        multiple uncles have died with myocardial infarction   No Known Allergies  OBJECTIVE: Blood pressure 105/79, pulse (!) 104, temperature 98.1 F (36.7 C), temperature source Axillary, resp. rate (!) 28, height 5' (1.524 m), weight 67.6 kg, SpO2 93 %.  Physical Exam Constitutional:      Appearance: She is ill-appearing.  HENT:     Head: Normocephalic and atraumatic.     Nose: No congestion or rhinorrhea.  Eyes:     General:        Right  eye: No discharge.        Left eye: No discharge.     Extraocular Movements: Extraocular movements intact.  Cardiovascular:     Rate and Rhythm: Tachycardia present.     Heart sounds: No murmur heard.    No friction rub. No gallop.  Pulmonary:     Effort: Tachypnea and accessory muscle usage present.     Breath sounds: No wheezing.  Abdominal:     General: Abdomen is flat. Bowel sounds are normal. There is no distension.     Palpations: Abdomen is soft. There is no mass.     Tenderness: There is no abdominal tenderness.     Hernia: No hernia is present.  Skin:    Coloration: Skin is pale.  Neurological:     General: No focal deficit present.     Mental Status: She is alert. She is disoriented.     Lab Results Lab Results  Component Value Date   WBC 17.5 (H) 08/19/2022   HGB 9.3 (L) 08/19/2022   HCT 27.2 (L) 08/19/2022   MCV 93.5 08/19/2022   PLT 639 (H) 08/19/2022    Lab Results  Component Value Date   CREATININE 0.44 08/19/2022   BUN 9 08/19/2022   NA 126 (L) 08/19/2022   K 3.3 (L) 08/19/2022   CL 98 08/19/2022   CO2 17 (L) 08/19/2022    Lab Results  Component Value Date   ALT 13 08/19/2022   AST 32 08/19/2022   ALKPHOS 66 08/19/2022   BILITOT 0.7 08/19/2022     Microbiology: Recent Results (from the past 240 hour(s))  C Difficile Quick Screen (NO PCR Reflex)     Status: None    Collection Time: 08/15/22  3:27 PM   Specimen: STOOL  Result Value Ref Range Status   C Diff antigen NEGATIVE NEGATIVE Final   C Diff toxin NEGATIVE NEGATIVE Final   C Diff interpretation No C. difficile detected.  Final    Comment: Performed at [facility], [place] [address], [place], [place] [address]  Culture, blood (Routine X 2) w Reflex to ID Panel     Status: None (Preliminary result)   Collection Time: 08/17/22  6:59 PM   Specimen: BLOOD  Result Value Ref Range Status   Specimen Description BLOOD LEFT ANTECUBITAL  Final   Special Requests   Final    BOTTLES DRAWN AEROBIC AND ANAEROBIC Blood Culture results may not be optimal due to an inadequate volume of blood received in culture bottles   Culture   Final    NO GROWTH 2 DAYS Performed at [facility], [place] [address] [place], [place] [address]    Report Status PENDING  Incomplete  Culture, blood (Routine X 2) w Reflex to ID Panel     Status: None (Preliminary result)   Collection Time: 08/17/22  7:01 PM   Specimen: BLOOD  Result Value Ref Range Status   Specimen Description BLOOD LEFT ANTECUBITAL  Final   Special Requests   Final    BOTTLES DRAWN AEROBIC AND ANAEROBIC Blood Culture results may not be optimal due to an inadequate volume of blood received in culture bottles   Culture   Final    NO GROWTH 2 DAYS Performed at [facility], [place] [address] [place],  [address]    Report Status PENDING  Incomplete    Alcide Evener, [place] [facility] [phone] pager  08/19/2022, 1:33 PM

## 2022-08-20 ENCOUNTER — Inpatient Hospital Stay (HOSPITAL_COMMUNITY): Payer: Medicare Other

## 2022-08-20 ENCOUNTER — Inpatient Hospital Stay: Payer: Self-pay

## 2022-08-20 DIAGNOSIS — T8142XD Infection following a procedure, deep incisional surgical site, subsequent encounter: Secondary | ICD-10-CM

## 2022-08-20 DIAGNOSIS — J690 Pneumonitis due to inhalation of food and vomit: Secondary | ICD-10-CM

## 2022-08-20 DIAGNOSIS — I5023 Acute on chronic systolic (congestive) heart failure: Secondary | ICD-10-CM

## 2022-08-20 DIAGNOSIS — J9601 Acute respiratory failure with hypoxia: Secondary | ICD-10-CM | POA: Diagnosis not present

## 2022-08-20 DIAGNOSIS — T8130XA Disruption of wound, unspecified, initial encounter: Secondary | ICD-10-CM | POA: Diagnosis not present

## 2022-08-20 DIAGNOSIS — T847XXA Infection and inflammatory reaction due to other internal orthopedic prosthetic devices, implants and grafts, initial encounter: Secondary | ICD-10-CM

## 2022-08-20 DIAGNOSIS — E440 Moderate protein-calorie malnutrition: Secondary | ICD-10-CM | POA: Diagnosis not present

## 2022-08-20 DIAGNOSIS — G9340 Encephalopathy, unspecified: Secondary | ICD-10-CM

## 2022-08-20 DIAGNOSIS — T8142XA Infection following a procedure, deep incisional surgical site, initial encounter: Secondary | ICD-10-CM

## 2022-08-20 DIAGNOSIS — G009 Bacterial meningitis, unspecified: Secondary | ICD-10-CM

## 2022-08-20 LAB — CBC
HCT: 27.3 % — ABNORMAL LOW (ref 36.0–46.0)
Hemoglobin: 9.3 g/dL — ABNORMAL LOW (ref 12.0–15.0)
MCH: 31.7 pg (ref 26.0–34.0)
MCHC: 34.1 g/dL (ref 30.0–36.0)
MCV: 93.2 fL (ref 80.0–100.0)
Platelets: 668 10*3/uL — ABNORMAL HIGH (ref 150–400)
RBC: 2.93 MIL/uL — ABNORMAL LOW (ref 3.87–5.11)
RDW: 17.3 % — ABNORMAL HIGH (ref 11.5–15.5)
WBC: 13.2 10*3/uL — ABNORMAL HIGH (ref 4.0–10.5)
nRBC: 0.4 % — ABNORMAL HIGH (ref 0.0–0.2)

## 2022-08-20 LAB — HEPATITIS B SURFACE ANTIBODY, QUANTITATIVE: Hep B S AB Quant (Post): <3.1 m[IU]/mL — ABNORMAL LOW (ref >9.9)

## 2022-08-20 LAB — COMPREHENSIVE METABOLIC PANEL
ALT: 26 U/L (ref 0–44)
AST: 66 U/L — ABNORMAL HIGH (ref 15–41)
Albumin: 1.8 g/dL — ABNORMAL LOW (ref 3.5–5.0)
Alkaline Phosphatase: 57 U/L (ref 38–126)
Anion gap: 13 (ref 5–15)
BUN: 6 mg/dL — ABNORMAL LOW (ref 8–23)
CO2: 17 mmol/L — ABNORMAL LOW (ref 22–32)
Calcium: 7.8 mg/dL — ABNORMAL LOW (ref 8.9–10.3)
Chloride: 99 mmol/L (ref 98–111)
Creatinine, Ser: 0.44 mg/dL (ref 0.44–1.00)
GFR, Estimated: >60 mL/min (ref >60)
Glucose, Bld: 73 mg/dL (ref 70–99)
Potassium: 3 mmol/L — ABNORMAL LOW (ref 3.5–5.1)
Sodium: 129 mmol/L — ABNORMAL LOW (ref 135–145)
Total Bilirubin: 0.7 mg/dL (ref 0.3–1.2)
Total Protein: 4.6 g/dL — ABNORMAL LOW (ref 6.5–8.1)

## 2022-08-20 LAB — GLUCOSE, CAPILLARY
Glucose-Capillary: 81 mg/dL (ref 70–99)
Glucose-Capillary: 79 mg/dL (ref 70–99)
Glucose-Capillary: 105 mg/dL — ABNORMAL HIGH (ref 70–99)
Glucose-Capillary: 97 mg/dL (ref 70–99)
Glucose-Capillary: 112 mg/dL — ABNORMAL HIGH (ref 70–99)
Glucose-Capillary: 123 mg/dL — ABNORMAL HIGH (ref 70–99)

## 2022-08-20 LAB — PHOSPHORUS: Phosphorus: 1.9 mg/dL — ABNORMAL LOW (ref 2.5–4.6)

## 2022-08-20 LAB — ECHOCARDIOGRAM LIMITED
AV Mean grad: 3 mmHg
AV Peak grad: 5.7 mmHg
Ao pk vel: 1.19 m/s
Area-P 1/2: 4.86 cm2
Calc EF: 46 %
Height: 60 in
S' Lateral: 3 cm
Single Plane A2C EF: 47.2 %
Single Plane A4C EF: 48.2 %
Weight: 2384 oz

## 2022-08-20 LAB — HEPATITIS B SURFACE ANTIGEN: Hepatitis B Surface Ag: NONREACTIVE

## 2022-08-20 LAB — TRIGLYCERIDES: Triglycerides: 274 mg/dL — ABNORMAL HIGH (ref <150)

## 2022-08-20 LAB — MAGNESIUM: Magnesium: 1.5 mg/dL — ABNORMAL LOW (ref 1.7–2.4)

## 2022-08-20 MED ORDER — ACETAMINOPHEN 650 MG RE SUPP: 650.0000 mg | Freq: Four times a day (QID) | RECTAL | Status: DC | PRN

## 2022-08-20 MED ORDER — SODIUM CHLORIDE 0.9% FLUSH: 10.0000 mL | INTRAVENOUS | Status: DC | PRN

## 2022-08-20 MED ORDER — PROSOURCE TF20 ENFIT COMPATIBL EN LIQD
60.0000 mL | Freq: Every day | ENTERAL | Status: DC
  Administered 2022-08-20 – 2022-08-23 (×4): 60 mL
  Filled 2022-08-20 (×4): qty 60

## 2022-08-20 MED ORDER — THIAMINE MONONITRATE 100 MG PO TABS
100.0000 mg | ORAL_TABLET | Freq: Every day | ORAL | Status: AC
  Administered 2022-08-20 – 2022-08-26 (×7): 100 mg
  Filled 2022-08-20 (×7): qty 1

## 2022-08-20 MED ORDER — ENSURE ENLIVE PO LIQD: 237.0000 mL | Freq: Three times a day (TID) | ORAL | Status: DC

## 2022-08-20 MED ORDER — SODIUM CHLORIDE 0.9% FLUSH
10.0000 mL | Freq: Two times a day (BID) | INTRAVENOUS | Status: DC
  Administered 2022-08-20: 30 mL
  Administered 2022-08-20 – 2022-08-21 (×3): 10 mL
  Administered 2022-08-22: 15 mL
  Administered 2022-08-22: 10 mL
  Administered 2022-08-23: 15 mL
  Administered 2022-08-24 – 2022-08-26 (×6): 10 mL
  Administered 2022-08-27: 20 mL
  Administered 2022-08-27 – 2022-09-01 (×10): 10 mL
  Administered 2022-09-01: 30 mL
  Administered 2022-09-02 – 2022-09-03 (×2): 10 mL

## 2022-08-20 MED ORDER — VITAL HIGH PROTEIN PO LIQD: 1000.0000 mL | ORAL | Status: DC

## 2022-08-20 MED ORDER — GADOBUTROL 1 MMOL/ML IV SOLN
6.0000 mL | Freq: Once | INTRAVENOUS | Status: AC | PRN
  Administered 2022-08-20: 6 mL via INTRAVENOUS

## 2022-08-20 MED ORDER — LEVOTHYROXINE SODIUM 88 MCG PO TABS
88.0000 ug | ORAL_TABLET | Freq: Every day | ORAL | Status: DC
  Administered 2022-08-21 – 2022-09-03 (×14): 88 ug
  Filled 2022-08-20 (×16): qty 1

## 2022-08-20 MED ORDER — ADULT MULTIVITAMIN W/MINERALS CH
1.0000 | ORAL_TABLET | Freq: Every day | ORAL | Status: DC
  Administered 2022-08-20 – 2022-09-03 (×15): 1
  Filled 2022-08-20 (×15): qty 1

## 2022-08-20 MED ORDER — OSMOLITE 1.5 CAL PO LIQD
1000.0000 mL | ORAL | Status: DC
  Administered 2022-08-20 – 2022-08-23 (×4): 1000 mL

## 2022-08-20 MED ORDER — VANCOMYCIN HCL 750 MG/150ML IV SOLN
750.0000 mg | Freq: Two times a day (BID) | INTRAVENOUS | Status: DC
  Administered 2022-08-20 – 2022-08-22 (×6): 750 mg via INTRAVENOUS
  Filled 2022-08-20 (×7): qty 150

## 2022-08-20 MED ORDER — SODIUM CHLORIDE 1 G PO TABS
1.0000 g | ORAL_TABLET | Freq: Two times a day (BID) | ORAL | Status: DC
  Administered 2022-08-20 – 2022-09-03 (×30): 1 g
  Filled 2022-08-20 (×29): qty 1

## 2022-08-20 MED ORDER — SODIUM PHOSPHATES 45 MMOLE/15ML IV SOLN
30.0000 mmol | Freq: Once | INTRAVENOUS | Status: AC
  Administered 2022-08-20: 30 mmol via INTRAVENOUS
  Filled 2022-08-20: qty 10

## 2022-08-20 MED ORDER — ACETAMINOPHEN 325 MG PO TABS
650.0000 mg | ORAL_TABLET | Freq: Four times a day (QID) | ORAL | Status: DC | PRN
  Administered 2022-08-20: 650 mg via ORAL
  Filled 2022-08-20 (×2): qty 2

## 2022-08-20 MED ORDER — POTASSIUM CHLORIDE 20 MEQ PO PACK
20.0000 meq | PACK | ORAL | Status: AC
  Administered 2022-08-20 (×2): 20 meq
  Filled 2022-08-20 (×2): qty 1

## 2022-08-20 MED ORDER — POTASSIUM CHLORIDE 10 MEQ/100ML IV SOLN
10.0000 meq | INTRAVENOUS | Status: DC
  Filled 2022-08-20 (×4): qty 100

## 2022-08-20 MED ORDER — MAGNESIUM SULFATE 4 GM/100ML IV SOLN
4.0000 g | Freq: Once | INTRAVENOUS | Status: AC
  Administered 2022-08-20: 4 g via INTRAVENOUS
  Filled 2022-08-20: qty 100

## 2022-08-20 MED ORDER — PERFLUTREN LIPID MICROSPHERE
1.0000 mL | INTRAVENOUS | Status: AC | PRN
  Administered 2022-08-20: 2 mL via INTRAVENOUS

## 2022-08-20 MED ORDER — POTASSIUM CHLORIDE 10 MEQ/100ML IV SOLN
10.0000 meq | INTRAVENOUS | Status: AC
  Administered 2022-08-20 (×4): 10 meq via INTRAVENOUS

## 2022-08-20 MED ORDER — LAMOTRIGINE 100 MG PO TABS
200.0000 mg | ORAL_TABLET | Freq: Every day | ORAL | Status: DC
  Administered 2022-08-20 – 2022-09-03 (×15): 200 mg
  Filled 2022-08-20 (×15): qty 2

## 2022-08-20 MED ORDER — SODIUM CHLORIDE 0.9 % IV SOLN
2.0000 g | Freq: Three times a day (TID) | INTRAVENOUS | Status: DC
  Administered 2022-08-20 – 2022-09-03 (×44): 2 g via INTRAVENOUS
  Filled 2022-08-20 (×44): qty 12.5

## 2022-08-20 MED ORDER — DONEPEZIL HCL 10 MG PO TABS
10.0000 mg | ORAL_TABLET | Freq: Every day | ORAL | Status: DC
  Administered 2022-08-20 – 2022-09-03 (×15): 10 mg
  Filled 2022-08-20 (×15): qty 1

## 2022-08-20 NOTE — Progress Notes (Addendum)
Pharmacy Antibiotic Note  [name] is a 67 y.o. female admitted on 08/09/2022 with  altered mental status has had recent spinal procedure 06/2022 .  Pharmacy has been consulted for vancomycin dosing.   Plan: Adjust regimen to CNS trough dosing using nomogram, CrCl est > 81mL/min.  Increase vancomycin 750 Q12H.  Cont cefepime 2g Q8H and flagyl 500 Q12H.  Follow culture data for de-escalation.  Monitor renal function for dose adjustments as indicated.   Height: 5' (152.4 cm) Weight: 67.6 kg (149 lb) IBW/kg (Calculated) : 45.5  Temp (24hrs), Avg:99.2 F (37.3 C), Min:98.1 F (36.7 C), Max:100.5 F (38.1 C)  Recent Labs  Lab 08/17/22 0410 08/17/22 1851 08/17/22 1857 08/18/22 1545 08/18/22 1741 08/18/22 2024 08/19/22 0354 08/19/22 1051 08/19/22 1503 08/19/22 1847 08/19/22 2044 08/20/22 0520  WBC  --  12.7*  --  19.9*  --   --  6.2 17.5*  --   --   --  13.2*  CREATININE 0.33*  --   --  0.42*  --   --  1.46* 0.44  --   --   --  0.44  LATICACIDVEN  --   --    < >  --    < > 2.5*  --  2.6* 3.4* 2.8* 2.0*  --    < > = values in this interval not displayed.    Estimated Creatinine Clearance: 58.5 mL/min (by C-G formula based on SCr of 0.44 mg/dL).    No Known Allergies  Thank you for allowing pharmacy to be a part of this patient's care.  Esmeralda Arthur, PharmD, BCCCP  08/20/2022 8:48 AM

## 2022-08-20 NOTE — Progress Notes (Signed)
Echo attempted. Patient having PICC line placed. Will attempt again as schedule permits.

## 2022-08-20 NOTE — Procedures (Signed)
Cortrak  Person Inserting Tube:  [name], [name] Tube Type:  Cortrak - 43 inches Tube Size:  10 Tube Location:  Left nare Secured by: Bridle Technique Used to Measure Tube Placement:  Marking at nare/corner of mouth Cortrak Secured At:  70 cm   Cortrak Tube Team Note:  Consult received to place a Cortrak feeding tube.   X-ray is required, abdominal x-ray has been ordered by the Cortrak team. Please confirm tube placement before using the Cortrak tube.   If the tube becomes dislodged please keep the tube and contact the Cortrak team at [email or web address] for replacement.  If after hours and replacement cannot be delayed, place a NG tube and confirm placement with an abdominal x-ray.   Marissa Gropp P., [name], LDN, CNSC See AMiON for contact information    

## 2022-08-20 NOTE — Discharge Instructions (Signed)

## 2022-08-20 NOTE — Progress Notes (Signed)
0220 Patient was transported to MRI via the ventilator with no complications.  Pt. connected to MRI ventilator for procedure.   0263 Patient transported back to 4N via the ventilator with no complications.

## 2022-08-20 NOTE — Progress Notes (Addendum)
Pharmacy Electrolyte Replacement  Recent Labs:  Recent Labs    08/20/22 0520  K 3.0*  CREATININE 0.44    Low Critical Values (K </= 2.5, Phos </= 1, Mg </= 1) Present: None   Plan: Replete per protocol with 55mEq IV KCL + 55mEq KCL per tube. Recheck in AM.   Esmeralda Arthur, PharmD, BCCCP   Addendum: Phos (1.9) and Mag (1.5) came back low. Supplement with 4g of magnesium and 26mmol NaPhos as patient already received potassium.

## 2022-08-20 NOTE — Progress Notes (Signed)
Peripherally Inserted Central Catheter Placement  The IV Nurse has discussed with the patient and/or persons authorized to consent for the patient, the purpose of this procedure and the potential benefits and risks involved with this procedure.  The benefits include less needle sticks, lab draws from the catheter, and the patient may be discharged home with the catheter. Risks include, but not limited to, infection, bleeding, blood clot (thrombus formation), and puncture of an artery; nerve damage and irregular heartbeat and possibility to perform a PICC exchange if needed/ordered by physician.  Alternatives to this procedure were also discussed.  Bard Power PICC patient education guide, fact sheet on infection prevention and patient information card has been provided to patient /or left at bedside.    PICC Placement Documentation  PICC Triple Lumen 29/47/65 Right Basilic 38 cm 0 cm (Active)  Indication for Insertion or Continuance of Line Prolonged intravenous therapies 08/20/22 1430  Exposed Catheter (cm) 0 cm 08/20/22 1430  Site Assessment Clean, Dry, Intact 08/20/22 1430  Lumen #1 Status Flushed;Saline locked;Blood return noted 08/20/22 1430  Lumen #2 Status Flushed;Saline locked;Blood return noted 08/20/22 1430  Lumen #3 Status Flushed;Saline locked;Blood return noted 08/20/22 1430  Dressing Type Transparent;Securing device 08/20/22 1430  Dressing Status Antimicrobial disc in place 08/20/22 1430  Dressing Intervention New dressing;Other (Comment) 08/20/22 1430  Dressing Change Due 08/27/22 08/20/22 1430   Telephone consent by Faythe Dingwall Albarece 08/20/2022, 2:49 PM

## 2022-08-20 NOTE — Progress Notes (Signed)
  Echocardiogram 2D Echocardiogram has been performed.  [name] 08/20/2022, 4:29 PM

## 2022-08-20 NOTE — Progress Notes (Signed)
NAME:  [name], MRN:  [id number], DOB:  Jan 05, 1956, LOS: 90 ADMISSION DATE:  08/09/2022, CONSULTATION DATE:  08/19/2022 REFERRING MD:  Darrick Meigs [facility]), CHIEF COMPLAINT:  Dyspnea, encephalopathy  History of Present Illness:  67 yo F PMH mild cognitive decline, bipolar disorder, recurrent UTIs, scoliosis with prior lumbar fusion who was admitted 1/22 for complex lumbar wound/visible lumbar spine hardware.   Has been admitted twice recently prior to current admission -- first for eval of back pain found to be unstable fracture at prior lumbar fusion site requiring PLIF L1-L5 on 12/9. Discharged to [facility] 12/21.  At SNF, wound dehisced and pt presented back to ED same day and was readmitted 12/21-12/29 (second admission), then dc back to SNF.  Her complex lumbar wound further deteriorated at SNF and she presented back to ED 1/22 with exposed lumbar hardware and was admitted (current admission).  She went to OR 1/22 for revision of L1-L5 fusion, removal of left L1-L5 screws and rod, and removal of right L1 screw, revision of L1-L5 posterior instrumented fusion.  She has been on doxy starting 1/24.  Current admission course has been complicated by encephalopathy which initially seemed 2/2 polypharmacy, then thought possible UTI, and Cefadroxil was added 1/26.  CNS depressing medications were also de-escalated.  Despite these measures, Pt become increasingly encephalopathic.  TRH was consulted 1/30 in this setting. She started spiking low grade temps 1/30 (100.7), and became febrile to 103 on 1/31, at which point abx were changed to vanc and zosyn.  Concomitantly, pt has had progressively worsening dyspnea and tachycardia.  CT lumbar spine did not show any surgical site infection.  [name] and ID were consulted on 2/1 in this setting.  MR brain concerning for ventriculitis or meningitis and continued on empiric treatment.  CTA chest concerning for PNA, continued on metronidazole for aspiration PNA.  Pertinent   Medical History  - Bipolar disorder - Mild cog impairment  - Recurrent UTI - Complex Lumbar spine Hx as noted above - T2DM - GERD  Significant Hospital Events: Including procedures, antibiotic start and stop dates in addition to other pertinent events  12/9 - PLIF for unstable L fracture through prior L spine fusion. Admitted 12/9-12/21, dc to SNF 12/21 12/21 - Readmitted 12/21-12/29 for wound dehiscence and dc back to SNF 1/22 - Readmitted for exposed L hardware; OR for removal of left L1-L5 screws, rod and right L1 screw. 1/22 - Started doxycycline. 1/23 - Encephalopathic 1/25 - Started ciprofloxacin 1/26 - Stopped ciprofloxacin, started cefadroxil for possible UTI  1/30 - New leukocytosis, ongoing confusion, fever; TRH consult 1/31 - Worse fever and confusion; abx change to vanc and zosyn 2/1 - Dyspnea, tachycardia. WBC 17.5.  [name] and ID consults. Abx changed to vanc, cefepime, and metronidazole. Admitted to ICU.  Hypoxemic, intubated.  CTA chest concerning for aspiration PNA. 2/2 - MR brain concerning for meningitis, continue empiric treatment w/ vanc and cefepime  Interim History / Subjective:  Patient sedated and not rousable this morning.  Family at bedside and appreciate updates.  Reassured family that there is continuity of care in ICU.  Understandably express frustration with care thus far in setting of multiple hospitalizations and patient's continual clinical deterioration.  Objective   Blood pressure (!) 86/64, pulse 93, temperature 99 F (37.2 C), temperature source Oral, resp. rate 14, height 5' (1.524 m), weight 67.6 kg, SpO2 100 %.    Vent Mode: PRVC FiO2 (%):  [40 %-100 %] 40 % Set Rate:  [20 bmp]  20 bmp Vt Set:  [360 mL] 360 mL PEEP:  [5 cmH20] 5 cmH20 Plateau Pressure:  [14 cmH20-15 cmH20] 14 cmH20   Intake/Output Summary (Last 24 hours) at 08/20/2022 0721 Last data filed at 08/20/2022 0600 Gross per 24 hour  Intake 1558.84 ml  Output 1150 ml  Net 408.84 ml    Filed Weights   08/06/22 1541 08/09/22 0931  Weight: 65.3 kg 67.6 kg   Examination: General: Ill-appearing elderly female, sedated. HEENT: Dry mucous membranes.  No scleral injections or icterus. Lungs: No respiratory distress on PSV w/ 40% FiO2.  Mild diffuse crackles. Cardiovascular: Tachycardic, regular rhythm Abdomen: Soft, nondistended. Normoactive bowel sounds. GU: Foley catheter present, good output. Extremities: Cold LE bilaterally but 2+ DP and PT pulses.  Capillary refill 2-3 seconds. Skin: Would vac present on back, not visualized today but draining well.  No rashes grossly. Neuro: Sedated on propofol and fentanyl.  Pupils constricted but reactive to light.  Not following instructions.  Responsive to noxious stimuli.  Flutters eyes to voice.  Ancillary tests/imaging  - MR brain: Diffusion abnormality in occipital horn indicating possible proteinaceous/purulent debris. - MR L spine: No bone marrow abnormality.  L2-5 fusion. - TTE: Pending - Respiratory tracheal culture: Pending  Assessment & Plan:  Meningitis vs ventriculitis  Sepsis, likely 2/2 meningitis Acute encephalopathy MRI brain concerning for meningitis vs ventriculitis.  LP contraindicated given risk for lumbar surgical site infection.  Will treat empirically.  Suspect encephalopathy 2/2 CNS infection, though could also be related to aspiration PNA, no evidence of seizures on EEG yesterday. - ID following - Continue vancomycin and cefepime for empiric Tx, pharmacy dosing - Pressors if below MAP goal > 65 - Delirium precautions  Aspiration pneumonia, bilateral Acute hypoxic respiratory failure CTA chest concerning for multifocal pneumonia, suspect aspiration PNA.  Intubated yesterday.  Stable on PSV and 40% FiO2.  Still encephalopathic today and not able to follow instructions.  Will do SBT tomorrow and consider extubating if mental status improves. - Metronidazole for anaerobic coverage - f/u respiratory  cultures and sensitivities - Pulm hygiene, VAP prevention - RASS goal 0 to -1; will do propofol and PRN fentanyl  Complex Lumbar wound (exposed hardware s/p removal of L L1-L5 screws, rod, R L1 screw on 1/22) No concern for abscess given MR and CT lumbar spine.  Negative pressure wound vac in place at surgical site. - Neurosurgery following   Electrolyte derangements Patient's hyponatremia improving, still hypokalemic.  Nonanion gap lactic acidosis still present, lactate trending down yesterday PM. - 75 mL/hr LR IVF maintenance - 40 mEq per tube and 40 mEq IV K repletion - Daily BMP, recheck magnesium   Anemia Thrombocytosis Coagulopathy, mild  Likely 2/2 sepsis.  CTM. - Trend CBC   T2DM  Hyperglycemia  A1c 6.0 on 07/08/22.  No insulin at home.  CBGs running low past 12 hours. - EN per nutrition - sSSI   Chronic HFrEF HTN CAD, non-obstructive  - f/u TTE - Continuous cardiac monitoring  Underlying chronic mild cog deficit   Bipolar disorder - Minimize CNS depressing meds as able - Continue home donepezil  Best Practice (right click and "Reselect all SmartList Selections" daily)   Diet/type: tubefeeds DVT prophylaxis: prophylactic heparin  GI prophylaxis: PPI Lines: N/A Foley:  Yes, and it is still needed Code Status:  full code Last date of multidisciplinary goals of care discussion: Updated family 2/2 at [employer], MS4 08/20/2022, 7:21 AM Johnell Comings, [name]

## 2022-08-20 NOTE — Progress Notes (Addendum)
Nutrition Follow-up  DOCUMENTATION CODES:   Non-severe (moderate) malnutrition in context of acute illness/injury  INTERVENTION:  Exchange OGT for cortrak tube. Once confirmed, initiate tube feeding: Osmolite 1.5 at 45 ml/h (1080 ml per day) Start at 25 and advance by 75mL q6h to goal Prosource TF20 60 ml 1x/d Provides 1700 kcal, 88 gm protein, 823 ml free water daily Pt at risk for refeeding due to poor PO intake recently. Labs resulted this AM and Mg, K, and phosphorus low. Pharmacy team monitoring and replacing 100mg  thiamine x 7 days  NUTRITION DIAGNOSIS:  Moderate Malnutrition related to acute illness as evidenced by energy intake < 75% for > 7 days, mild fat depletion, mild muscle depletion. - revised 2/2  GOAL:  Patient will meet greater than or equal to 90% of their needs - progressing, TF to be initiated  MONITOR:  TF tolerance, Weight trends, Vent status, Labs  REASON FOR ASSESSMENT:  Consult Enteral/tube feeding initiation and management  ASSESSMENT:  Pt with hx of GERD, HLD, HTN, and DM type 2 presented to ED for planned revision of lumbar surgery after previous was not healing and had exposed hardware.  2/1 - transferred to 4N, intubated   Patient is currently intubated on ventilator support. Family at bedside able to provide a nutrition hx. Report that pt has lost a significant amount of weight over the last 1-2 years (about 60 lbs) but that some of this was a result of medications (metformin and ozempic). 6.9% weight loss note in the last month which is severe for this timeframe. Family reports that intake has been poor this admission, but that prior was doing well. Likes to eat a traditional [place].   Attached handouts to AVS for high kcal and high protein nutrition per family's request to aid with adequate intake after discharge. Also discussed replacing OGT for cortrak so that nutrition therapy could be continued even if pt was extubated until she  could meet her needs orally. Family agreeable. Discussed with NP, ok to place cortrak order.   MV: 8.5 L/min Temp (24hrs), Avg:99 F (37.2 C), Min:98.1 F (36.7 C), Max:100.5 F (38.1 C)  Propofol: 12.17 ml/hr (321kcal/d)   Intake/Output Summary (Last 24 hours) at 08/20/2022 1049 Last data filed at 08/20/2022 0600 Gross per 24 hour  Intake 1555.84 ml  Output 1150 ml  Net 405.84 ml  Net IO Since Admission: -9,152.98 mL [08/20/22 1049]  Average Meal Intake: 1/25-1/28: 15% intake x 3 recorded meals  Nutritionally Relevant Medications: Scheduled Meds:  docusate  100 mg Per Tube BID   famotidine  20 mg Per Tube BID   feeding supplement  237 mL Oral TID BM   insulin aspart  0-9 Units Subcutaneous Q4H   multivitamin with minerals  1 tablet Oral Daily   pantoprazole  80 mg Oral QHS   polyethylene glycol  17 g Per Tube Daily   sodium chloride  1 g Oral BID WC   Continuous Infusions:  propofol (DIPRIVAN) infusion 30 mcg/kg/min (08/20/22 0731)   PRN Meds: ondansetron, polyethylene glycol  Labs Reviewed: Na 129 K 3.0 BUN 6  NUTRITION - FOCUSED PHYSICAL EXAM: Flowsheet Row Most Recent Value  Orbital Region Mild depletion  Upper Arm Region Mild depletion  Thoracic and Lumbar Region No depletion  Buccal Region Mild depletion  Temple Region Moderate depletion  Clavicle Bone Region No depletion  Clavicle and Acromion Bone Region Mild depletion  Scapular Bone Region Mild depletion  Dorsal Hand No depletion  [edema]  Patellar Region Moderate depletion  Anterior Thigh Region Moderate depletion  Posterior Calf Region Moderate depletion  Edema (RD Assessment) Mild  [hands]  Hair Reviewed  Eyes Reviewed  Mouth Reviewed  Skin Reviewed  Nails Reviewed   Diet Order:   Diet Order             Diet NPO time specified  Diet effective now                   EDUCATION NEEDS:  Not appropriate for education at this time  Skin:  Skin Assessment: Reviewed RN Assessment (Stage 2  to the sacrum)  Last BM:  1/31 - type 6  Height:  Ht Readings from Last 1 Encounters:  08/09/22 5' (1.524 m)    Weight:  Wt Readings from Last 1 Encounters:  08/09/22 67.6 kg    Ideal Body Weight:  45.5 kg  BMI:  Body mass index is 29.1 kg/m.  Estimated Nutritional Needs:  Kcal:  1600-1800 kcal/d Protein:  85-100 g/d Fluid:  1.8L/d    Ranell Patrick, RD, LDN Clinical Dietitian RD pager # available in [place]  After hours/weekend pager # available in [facility]

## 2022-08-20 NOTE — Progress Notes (Signed)
PHARMACY NOTE:  ANTIMICROBIAL RENAL DOSAGE ADJUSTMENT  Current antimicrobial regimen includes a mismatch between antimicrobial dosage and estimated renal function.  As per policy approved by the Pharmacy & Therapeutics and Medical Executive Committees, the antimicrobial dosage will be adjusted accordingly.  Current antimicrobial dosage:  Cefepime 2g IV Q12H   Indication: Sepsis of unclear etiology- possibly PNA, wound infection   Renal Function:  Estimated Creatinine Clearance: 58.5 mL/min (by C-G formula based on SCr of 0.44 mg/dL). []      On intermittent HD, scheduled: []      On CRRT    Antimicrobial dosage has been changed to:  Cefepime 2g IV Q8H   Additional comments: Using normalized CrCl due to patient's low weight, CrCl comes out to be ~77 mL/min (C-G) using Scr 0.8 as surrogate due to low muscle mass   Thank you for allowing pharmacy to be a part of this patient's care.  Adria Dill, PharmD PGY-2 Infectious Diseases Resident  08/20/2022 8:19 AM

## 2022-08-20 NOTE — Progress Notes (Signed)
Neurosurgery Service Progress Note  Subjective: Worsened overnight, transferred to the unit and intubated for poor airway protection  Objective: Vitals:   08/20/22 0615 08/20/22 0647 08/20/22 0745 08/20/22 0800  BP: (!) 72/60 (!) 86/64    Pulse: 97 93    Resp: 20 14    Temp:    98.5 F (36.9 C)  TempSrc:    Axillary  SpO2: 100% 100% 100%   Weight:      Height:        Physical Exam: Intubated, sedated, pupils 85mm OU and reactive, gaze conjugate, opens eyes to voice with sedation paused and withdraws x4, prevena in place with minimal output  Assessment & Plan: 67 y.o. female s/p revision of complex lumbar wound, removal of left L1-L5 lumbar screws and rod, removal of right L1 screw, revision of L1-L5 posterior instrumented fusion. Complicated post-operative course, now with respiratory failure.   -attempted to update and discuss with her family at bedside this morning. They are upset and refused to speak to me, said they do not want to discuss her care with me, they do not want to explain why. -CT/MRI of the L-spine reviewed, no concerning findings. MRI brain reviewed, very small R F diffusion changes that would be unlikely to be symptomatic. There is some layering diffusion change in the ventricles bilaterally, unable to tell what this is from the imaging. It doesn't appear like typical ventriculitis, no meningeal enhancement, but no clear susceptibility changes in that region on GRE. If this is mild ventriculitis, she has good coverage with metro + vanc + cefepime, so I don't think this changes our treatment at this time -remainder of care per CCM   Judith Part, MD

## 2022-08-20 NOTE — Progress Notes (Signed)
PT Cancellation Note  Patient Details Name: [name] MRN: [id number] DOB: 1955-10-25   Cancelled Treatment:    Reason Eval/Treat Not Completed: Patient not medically ready.  Hold per RN, intubated and on "rest"  Wait until Monday. 08/20/2022  Marissa Carne., PT Acute Rehabilitation Services [phone]  (office)   [name] 08/20/2022, 2:09 PM

## 2022-08-20 NOTE — Progress Notes (Signed)
Subjective: No new complaints   Antibiotics:  Anti-infectives (From admission, onward)    Start     Dose/Rate Route Frequency Ordered Stop   08/20/22 0900  vancomycin (VANCOREADY) IVPB 750 mg/150 mL        750 mg 150 mL/hr over 60 Minutes Intravenous Every 12 hours 08/20/22 0848     08/20/22 0835  ceFEPIme (MAXIPIME) 2 g in sodium chloride 0.9 % 100 mL IVPB        2 g 200 mL/hr over 30 Minutes Intravenous Every 8 hours 08/20/22 0820     08/19/22 1800  vancomycin (VANCOCIN) IVPB 1000 mg/200 mL premix  Status:  Discontinued        1,000 mg 200 mL/hr over 60 Minutes Intravenous Every 24 hours 08/18/22 1628 08/19/22 0834   08/19/22 1800  vancomycin (VANCOREADY) IVPB 750 mg/150 mL  Status:  Discontinued        750 mg 150 mL/hr over 60 Minutes Intravenous Every 24 hours 08/19/22 0834 08/20/22 0848   08/19/22 1215  metroNIDAZOLE (FLAGYL) IVPB 500 mg        500 mg 100 mL/hr over 60 Minutes Intravenous Every 12 hours 08/19/22 1118     08/19/22 1215  ceFEPIme (MAXIPIME) 2 g in sodium chloride 0.9 % 100 mL IVPB  Status:  Discontinued        2 g 200 mL/hr over 30 Minutes Intravenous Every 12 hours 08/19/22 1118 08/20/22 0820   08/18/22 1715  piperacillin-tazobactam (ZOSYN) IVPB 3.375 g  Status:  Discontinued        3.375 g 12.5 mL/hr over 240 Minutes Intravenous Every 8 hours 08/18/22 1619 08/19/22 1118   08/18/22 1715  vancomycin (VANCOREADY) IVPB 1500 mg/300 mL        1,500 mg 150 mL/hr over 120 Minutes Intravenous  Once 08/18/22 1628 08/18/22 1938   08/13/22 1000  cefadroxil (DURICEF) capsule 1,000 mg  Status:  Discontinued        1,000 mg over 6 Days Oral 2 times daily 08/13/22 0838 08/18/22 1611   08/12/22 2000  ciprofloxacin (CIPRO) tablet 500 mg  Status:  Discontinued        500 mg Oral 2 times daily 08/12/22 1709 08/13/22 0839   08/10/22 1500  doxycycline (VIBRA-TABS) tablet 100 mg  Status:  Discontinued        100 mg Oral Every 12 hours 08/10/22 1410 08/18/22 1502    08/09/22 2100  ceFAZolin (ANCEF) IVPB 2g/100 mL premix        2 g 200 mL/hr over 30 Minutes Intravenous Every 8 hours 08/09/22 1704 08/10/22 0547   08/09/22 1030  ceFAZolin (ANCEF) IVPB 2g/100 mL premix        2 g 200 mL/hr over 30 Minutes Intravenous On call to O.R. 08/09/22 1019 08/09/22 1315   08/09/22 1022  ceFAZolin (ANCEF) 2-4 GM/100ML-% IVPB       Note to Pharmacy: Nanine Means S: cabinet override      08/09/22 1022 08/09/22 1136       Medications: Scheduled Meds:  Chlorhexidine Gluconate Cloth  6 each Topical Daily   docusate  100 mg Per Tube BID   donepezil  10 mg Per Tube Daily   famotidine  20 mg Per Tube BID   feeding supplement (PROSource TF20)  60 mL Per Tube Daily   heparin  5,000 Units Subcutaneous Q8H   insulin aspart  0-9 Units Subcutaneous Q4H   lamoTRIgine  200 mg Per Tube  Daily   [START ON 08/21/2022] levothyroxine  88 mcg Per Tube QAC breakfast   multivitamin with minerals  1 tablet Per Tube Daily   mouth rinse  15 mL Mouth Rinse Q2H   polyethylene glycol  17 g Per Tube Daily   potassium chloride  20 mEq Per Tube Q4H   sodium chloride flush  3 mL Intravenous Q12H   sodium chloride  1 g Per Tube BID WC   thiamine  100 mg Per Tube Daily   Continuous Infusions:  ceFEPime (MAXIPIME) IV Stopped (08/20/22 1034)   feeding supplement (OSMOLITE 1.5 CAL) 1,000 mL (08/20/22 1224)   magnesium sulfate bolus IVPB     metronidazole 500 mg (08/20/22 1319)   potassium chloride     propofol (DIPRIVAN) infusion 45 mcg/kg/min (08/20/22 1200)   sodium chloride     sodium phosphate 30 mmol in dextrose 5 % 250 mL infusion     vancomycin Stopped (08/20/22 1137)   PRN Meds:.acetaminophen **OR** acetaminophen, diphenoxylate-atropine, fentaNYL (SUBLIMAZE) injection, fentaNYL (SUBLIMAZE) injection, menthol-cetylpyridinium **OR** phenol, ondansetron **OR** ondansetron (ZOFRAN) IV, mouth rinse, oxyCODONE, polyethylene glycol, sodium chloride flush    Objective: Weight  change:   Intake/Output Summary (Last 24 hours) at 08/20/2022 1322 Last data filed at 08/20/2022 1200 Gross per 24 hour  Intake 2157.96 ml  Output 1150 ml  Net 1007.96 ml   Blood pressure (!) 86/57, pulse 91, temperature 98.3 F (36.8 C), temperature source Axillary, resp. rate (!) 21, height 5' (1.524 m), weight 67.6 kg, SpO2 99 %. Temp:  [98.2 F (36.8 C)-100.5 F (38.1 C)] 98.3 F (36.8 C) (02/02 1200) Pulse Rate:  [87-123] 91 (02/02 1203) Resp:  [14-34] 21 (02/02 1203) BP: (72-140)/(53-96) 86/57 (02/02 1203) SpO2:  [96 %-100 %] 99 % (02/02 1203) FiO2 (%):  [40 %-100 %] 40 % (02/02 0745)  Physical Exam: Constitutional:elderly female, intubated and moderately sedated. Cardio:Tachycardic  Pulm:stable on ventilator  Skin:Warm and dry.   CBC: CBC    Component Value Date/Time   WBC 13.2 (H) 08/20/2022 0520   RBC 2.93 (L) 08/20/2022 0520   HGB 9.3 (L) 08/20/2022 0520   HCT 27.3 (L) 08/20/2022 0520   PLT 668 (H) 08/20/2022 0520   MCV 93.2 08/20/2022 0520   MCH 31.7 08/20/2022 0520   MCHC 34.1 08/20/2022 0520   RDW 17.3 (H) 08/20/2022 0520   LYMPHSABS 1.7 08/19/2022 1051   MONOABS 1.3 (H) 08/19/2022 1051   EOSABS 0.0 08/19/2022 1051   BASOSABS 0.0 08/19/2022 1051      BMET Recent Labs    08/19/22 1051 08/19/22 1851 08/20/22 0520  NA 126* 127* 129*  K 3.3* 3.2* 3.0*  CL 98  --  99  CO2 17*  --  17*  GLUCOSE 238*  --  73  BUN 9  --  6*  CREATININE 0.44  --  0.44  CALCIUM 7.6*  --  7.8*     Liver Panel  Recent Labs    08/19/22 1051 08/20/22 0520  PROT 5.3* 4.6*  ALBUMIN 2.0* 1.8*  AST 32 66*  ALT 13 26  ALKPHOS 66 57  BILITOT 0.7 0.7       Sedimentation Rate Recent Labs    08/19/22 1515  ESRSEDRATE 37*   C-Reactive Protein Recent Labs    08/19/22 1515  CRP 4.0*    Micro Results: Recent Results (from the past 720 hour(s))  Surgical pcr screen     Status: Abnormal   Collection Time: 08/09/22 10:15 AM   Specimen:  Nasal Mucosa; Nasal  Swab  Result Value Ref Range Status   MRSA, PCR NEGATIVE NEGATIVE Corrected   Staphylococcus aureus POSITIVE (A) NEGATIVE Corrected    Comment: (NOTE) The Xpert SA Assay (FDA approved for NASAL specimens in patients 40 years of age and older), is one component of a comprehensive surveillance program. It is not intended to diagnose infection nor to guide or monitor treatment. Performed at [facility], [address], [place], [place] [address] CORRECTED ON 01/22 AT 1607: PREVIOUSLY REPORTED AS POSITIVE   C Difficile Quick Screen (NO PCR Reflex)     Status: None   Collection Time: 08/15/22  3:27 PM   Specimen: STOOL  Result Value Ref Range Status   C Diff antigen NEGATIVE NEGATIVE Final   C Diff toxin NEGATIVE NEGATIVE Final   C Diff interpretation No C. difficile detected.  Final    Comment: Performed at [facility], [address] [place], [place] [address]  Culture, blood (Routine X 2) w Reflex to ID Panel     Status: None (Preliminary result)   Collection Time: 08/17/22  6:59 PM   Specimen: BLOOD  Result Value Ref Range Status   Specimen Description BLOOD LEFT ANTECUBITAL  Final   Special Requests   Final    BOTTLES DRAWN AEROBIC AND ANAEROBIC Blood Culture results may not be optimal due to an inadequate volume of blood received in culture bottles   Culture   Final    NO GROWTH 3 DAYS Performed at [facility], [address], [place], [place] [address]    Report Status PENDING  Incomplete  Culture, blood (Routine X 2) w Reflex to ID Panel     Status: None (Preliminary result)   Collection Time: 08/17/22  7:01 PM   Specimen: BLOOD  Result Value Ref Range Status   Specimen Description BLOOD LEFT ANTECUBITAL  Final   Special Requests   Final    BOTTLES DRAWN AEROBIC AND ANAEROBIC Blood Culture results may not be optimal due to an inadequate volume of blood received in culture bottles   Culture   Final    NO GROWTH 3 DAYS Performed at [facility]  Lab, [address] [place], [place] [address]    Report Status PENDING  Incomplete  Culture, Respiratory w Gram Stain     Status: None (Preliminary result)   Collection Time: 08/20/22 12:22 AM   Specimen: Tracheal Aspirate; Respiratory  Result Value Ref Range Status   Specimen Description TRACHEAL ASPIRATE  Final   Special Requests NONE  Final   Gram Stain   Final    FEW WBC PRESENT,BOTH PMN AND MONONUCLEAR RARE YEAST WITH PSEUDOHYPHAE Performed at [facility], [address], [place], [place] [address]    Culture PENDING  Incomplete   Report Status PENDING  Incomplete    Studies/Results: DG Abd Portable 1V  Result Date: 08/20/2022 CLINICAL DATA:  Encounter for feeding tube placement. EXAM: PORTABLE ABDOMEN - 1 VIEW COMPARISON:  None Available. FINDINGS: Tip of the weighted enteric tube is below the diaphragm in the midline in the region of the mid distal stomach. Nonobstructed upper abdominal bowel gas pattern. Scoliosis and lumbar degenerative change. IMPRESSION: Tip of the weighted enteric tube in the mid distal stomach. Electronically Signed   By: Narda Rutherford M.D.   On: 08/20/2022 11:39   [place] EKG SITE RITE  Result Date: 08/20/2022 If [facility] image not attached, placement could not be confirmed  due to current cardiac rhythm.  MR Lumbar Spine W Wo Contrast  Result Date: 08/20/2022 CLINICAL DATA:  Recent spinal surgery.  Possible postop infection. EXAM: MRI LUMBAR SPINE WITHOUT AND WITH CONTRAST TECHNIQUE: Multiplanar and multiecho pulse sequences of the lumbar spine were obtained without and with intravenous contrast. CONTRAST:  12mL GADAVIST GADOBUTROL 1 MMOL/ML IV SOLN COMPARISON:  06/25/2022 FINDINGS: Segmentation:  Standard. Alignment: Lumbar levoscoliosis with apex at L2. grade 1 anterolisthesis at L5-S1 Vertebrae: L2-5 right-sided posterior fusion. Bone marrow edema at L3 has decreased. No new site of bone marrow signal abnormality or enhancement. No acute fracture. Conus  medullaris and cauda equina: Conus extends to the L1 level. Conus and cauda equina appear normal. Paraspinal and other soft tissues: Dorsal postoperative changes. Disc levels: L1-2: No spinal canal or neural foraminal stenosis. L2-3: Right transpedicular screws. Widely patent spinal canal. No neural impingement. L3-4: Right transpedicular screws. No spinal canal or neural foraminal stenosis. L4-5: Right L4 transpedicular screw. Widely patent spinal canal. No foraminal stenosis. L5-S1: No spinal canal or neural foraminal stenosis. Visualized sacrum: Normal. IMPRESSION: 1. L2-5 right-sided posterior fusion with widely patent spinal canal and neural foramina. 2. Decreased bone marrow edema at L3. No new site of bone marrow signal abnormality or enhancement. Electronically Signed   By: Ulyses Jarred M.D.   On: 08/20/2022 03:50   MR BRAIN W WO CONTRAST  Result Date: 08/20/2022 CLINICAL DATA:  Delirium EXAM: MRI HEAD WITHOUT AND WITH CONTRAST TECHNIQUE: Multiplanar, multiecho pulse sequences of the brain and surrounding structures were obtained without and with intravenous contrast. CONTRAST:  55mL GADAVIST GADOBUTROL 1 MMOL/ML IV SOLN COMPARISON:  Head CT 08/19/2022 Brain MRI 02/01/2017 FINDINGS: Brain: There are punctate foci of abnormal diffusion restriction within the right frontal lobe and in the occipital horn of the left lateral ventricle. The former is likely a punctate focus of ischemia but the latter may be proteinaceous or purulent debris. No chronic microhemorrhage or siderosis. There is multifocal hyperintense T2-weighted signal within the white matter. Generalized volume loss. The midline structures are normal. There is no abnormal contrast enhancement. Vascular: Normal flow voids. Skull and upper cervical spine: Normal marrow signal. Sinuses/Orbits: Right mastoid fluid. Bilateral ocular lens replacements. Paranasal sinuses are clear. Other: None IMPRESSION: 1. Punctate focus of acute ischemia in the right  frontal lobe. 2. Diffusion abnormality within the occipital horn of the left lateral ventricle may indicate proteinaceous/purulent debris. Blood considered less likely given the absence of SWI abnormality and lack of hemorrhage elsewhere. 3. No hemorrhage or mass effect. Electronically Signed   By: Ulyses Jarred M.D.   On: 08/20/2022 03:44   DG CHEST PORT 1 VIEW  Result Date: 08/19/2022 CLINICAL DATA:  Check endotracheal tube EXAM: PORTABLE CHEST 1 VIEW COMPARISON:  08/18/2022 FINDINGS: Endotracheal tube is noted 1.8 cm above the carina. Gastric catheter extends into the stomach. Cardiac shadow is stable. Mild central vascular congestion is noted without edema. Mild atelectatic changes are seen in the bases. No acute bony abnormality is noted. IMPRESSION: Tubes and lines in satisfactory position. Increased central vascular congestion with basilar atelectasis. Electronically Signed   By: [name] M.D.   On: 08/19/2022 18:17   EEG adult  Result Date: 08/19/2022 Lora Havens, MD     08/19/2022  5:47 PM Patient Name: BRITTYN SALAZ MRN: [id number] Epilepsy Attending: Lora Havens Referring Physician/Provider: Cristal Generous, NP Date: 08/19/2022 Duration: 24.34 mins Patient history: 67yo F with ams. EEG to evaluate for seizure Level of  alertness: Awake AEDs during EEG study: LTG, Propofol Technical aspects: This EEG study was done with scalp electrodes positioned according to the 10-20 International system of electrode placement. Electrical activity was reviewed with band pass filter of 1-70Hz , sensitivity of 7 uV/mm, display speed of 61mm/sec with a 60Hz  notched filter applied as appropriate. EEG data were recorded continuously and digitally stored.  Video monitoring was available and reviewed as appropriate. Description: EEG showed continuous generalized 3 to 6 Hz theta-delta slowing.  Hyperventilation and photic stimulation were not performed.   ABNORMALITY - Continuous slow, generalized  IMPRESSION: This study is suggestive of moderate diffuse encephalopathy, nonspecific etiology. No seizures or epileptiform discharges were seen throughout the recording. Lora Havens   CT LUMBAR SPINE W CONTRAST  Result Date: 08/19/2022 CLINICAL DATA:  Low back pain, infection suspected EXAM: CT LUMBAR SPINE WITH CONTRAST TECHNIQUE: Multidetector CT imaging of the lumbar spine was performed with intravenous contrast administration. RADIATION DOSE REDUCTION: This exam was performed according to the departmental dose-optimization program which includes automated exposure control, adjustment of the mA and/or kV according to patient size and/or use of iterative reconstruction technique. CONTRAST:  17mL OMNIPAQUE IOHEXOL 350 MG/ML SOLN COMPARISON:  08/06/2022 CT lumbar spine FINDINGS: Segmentation: 5 lumbar type vertebrae. Alignment: Unchanged scoliosis, centered at L1. Redemonstrated fused grade 1 anterolisthesis of L5 on S1. Vertebrae: Osteopenia. Posterior fixation L2-L5. Redemonstrated subacute fracture through the L3 vertebral body. No additional acute fracture. No evidence of osteomyelitis. Interval removal of the bilateral L1 screws and left L2-L5 screws. Tracts from prior S1 screws. There remains solid osseous fusion of L1-S1. Postsurgical changes are again noted in both iliac wings consistent with bone graft donor sites. Paraspinal and other soft tissues: Increased air and fluid in the posterior soft tissues, not unexpected in the setting of recent surgery. Vascular calcifications. Diverticulosis without diverticulitis. Disc levels: Unchanged appearance of the lumbar disc spaces, osseous spinal canal, and neural foramina, without significant stenosis. IMPRESSION: 1. Interval removal of the bilateral L1 screws and left L2-L5 screws. 2. Redemonstrated subacute fracture through the L3 vertebral body. No additional acute fracture. No evidence of osteomyelitis. 3. Increased air and fluid in the posterior soft  tissues, not unexpected in the setting of recent surgery. Electronically Signed   By: Merilyn Baba M.D.   On: 08/19/2022 17:06   CT Angio Chest Pulmonary Embolism (PE) W or WO Contrast  Result Date: 08/19/2022 CLINICAL DATA:  High probability for PE. Low back pain. Infection suspected. EXAM: CT ANGIOGRAPHY CHEST WITH CONTRAST TECHNIQUE: Multidetector CT imaging of the chest was performed using the standard protocol during bolus administration of intravenous contrast. Multiplanar CT image reconstructions and MIPs were obtained to evaluate the vascular anatomy. RADIATION DOSE REDUCTION: This exam was performed according to the departmental dose-optimization program which includes automated exposure control, adjustment of the mA and/or kV according to patient size and/or use of iterative reconstruction technique. CONTRAST:  55mL OMNIPAQUE IOHEXOL 350 MG/ML SOLN COMPARISON:  Chest x-ray 08/18/2022 FINDINGS: Cardiovascular: Heart is mildly enlarged. Aorta is normal in size. There are atherosclerotic calcifications of the aorta. There is adequate opacification of the pulmonary arteries to the segmental level. There is no evidence for pulmonary embolism. Mediastinum/Nodes: There is an enlarged right hilar lymph node measuring 12 mm short axis. There are additional nonenlarged hilar and mediastinal lymph nodes. The esophagus and visualized thyroid gland are within normal limits. Lungs/Pleura: There are small bilateral pleural effusions, right greater than left. Multifocal patchy airspace and ground-glass opacities are seen throughout  Horald [id number] Gainsway LaneDoctors Outpatient Center For Surgery IncanmBellemeadeNoveOhsu Hospital And Clinicsla Orange Asc [phone] [facility]oniumFreder[facility]>09Nada Libmanilla del [email or web address] CamelotNovella [email or web address]  [address][id number] PollenKentuckyGeralyn Corwin [phone] PLLCBrighton Surgical Center IncEndo Surg[facility]>09Nada [email or web address] HillsNovella [email or web address]  [id number] Pollen [phone] SmithCo[facility]>09Nada LibmanPort [email or web address]  [address][id number] PollenKentuckyGeralyn Corwin [phone] CenterBig South Fork Medical CenterMon[facility]>09Nada LibmanLake in the [email or web address] [email or web address]  [address][id number] PollenKentuckyGeralyn Corwin [phone] Ihs Indian HospitalChristus Southeast Texas - St MaryIu [facility]>09Nada LibmanPaw [email or web address] HallieNovella [email or web address]  [address]  proteinaceous/purulent debris and punctate acute ischemia in the right frontal lobe.   Principal Problem:   Wound dehiscence Active Problems:   Encephalopathy acute   Aspiration pneumonia of both lungs (HCC)    Acute respiratory failure with hypoxia (HCC)   Sepsis (HCC)   Seizure ([place])   Malnutrition of moderate degree    JERMESHA SOTTILE is a 67 y.o. female with history of heart failure nonobstructive coronary disease ventral septal defect diabetes mellitus hypertension who had developed an L1-L5 fracture that was closed but unstable.  She underwent surgery on June 26, 2022 with stabilization of the fracture and posterolateral instrumentation of the fusion.   Her postoperative course was complicate by wound dehiscence with frank exposure of hardware which her daughter showed on photo.   Patient was brought back and underwent revision of her complex lumbar wound with removal of her left sided L1-3 4 and 5 lumbar screws and rod in its entirety as well as removal of right L1 screw and revision of the L1-L5 posterior instrumented fusion.   He was given doxycycline postoperatively if I understand correctly as a preventative antibiotic per the family.  Later on the 25th she was begun on ciprofloxacin based on some pyuria though there is not any clear symptoms and the culture does not appear to have been done.   She had had a temperature up to 100.1 at that time.  Cefadroxil was begun on the 26th and continued along with doxycycline in the interim the patient developed fevers on the 30th that were more low-grade up to 100-101 but in the last 24 hours have peaked at 103.3.  Yesterday she had become encephalopathic and there was concern for increasing septic physiology.   Blood cultures were taken on the 30th she was broadened to vancomycin and Zosyn. On 2/1 she was intubated for respiratory distress due to aspiration. He antibiotics were also broadened to vancomycin, cefepime, and metronidazole.    With her recent spinal surgeries x 2 I am obviously concerned that she may have a postoperative infection that may require further debridement which point time cultures can be obtained. Fortunately there is no  evidence of osteomyelitis on imaging of the L-spine but there is purulent material in one of the cerebral ventricles. However, due to her wound there is not a site for LP at the moment.   With the current information we will plan to treat with 6 weeks of antibiotics to cover for possible hardware involvement and to cover for meningitis since we cannot LP at this time.     LOS: 11 days   Johny Blamer 08/20/2022, 1:22 PM

## 2022-08-21 DIAGNOSIS — G009 Bacterial meningitis, unspecified: Secondary | ICD-10-CM

## 2022-08-21 DIAGNOSIS — J9601 Acute respiratory failure with hypoxia: Secondary | ICD-10-CM | POA: Diagnosis not present

## 2022-08-21 DIAGNOSIS — T847XXD Infection and inflammatory reaction due to other internal orthopedic prosthetic devices, implants and grafts, subsequent encounter: Secondary | ICD-10-CM | POA: Diagnosis not present

## 2022-08-21 DIAGNOSIS — T8130XA Disruption of wound, unspecified, initial encounter: Secondary | ICD-10-CM | POA: Diagnosis not present

## 2022-08-21 DIAGNOSIS — J690 Pneumonitis due to inhalation of food and vomit: Secondary | ICD-10-CM | POA: Diagnosis not present

## 2022-08-21 LAB — BASIC METABOLIC PANEL
Anion gap: 8 (ref 5–15)
BUN: 8 mg/dL (ref 8–23)
CO2: 19 mmol/L — ABNORMAL LOW (ref 22–32)
Calcium: 6.9 mg/dL — ABNORMAL LOW (ref 8.9–10.3)
Chloride: 103 mmol/L (ref 98–111)
Creatinine, Ser: <0.3 mg/dL — ABNORMAL LOW (ref 0.44–1.00)
Glucose, Bld: 152 mg/dL — ABNORMAL HIGH (ref 70–99)
Potassium: 3.4 mmol/L — ABNORMAL LOW (ref 3.5–5.1)
Sodium: 130 mmol/L — ABNORMAL LOW (ref 135–145)

## 2022-08-21 LAB — MAGNESIUM: Magnesium: 2 mg/dL (ref 1.7–2.4)

## 2022-08-21 LAB — GLUCOSE, CAPILLARY
Glucose-Capillary: 126 mg/dL — ABNORMAL HIGH (ref 70–99)
Glucose-Capillary: 151 mg/dL — ABNORMAL HIGH (ref 70–99)
Glucose-Capillary: 158 mg/dL — ABNORMAL HIGH (ref 70–99)
Glucose-Capillary: 158 mg/dL — ABNORMAL HIGH (ref 70–99)
Glucose-Capillary: 166 mg/dL — ABNORMAL HIGH (ref 70–99)
Glucose-Capillary: 168 mg/dL — ABNORMAL HIGH (ref 70–99)

## 2022-08-21 LAB — PHOSPHORUS: Phosphorus: 2.8 mg/dL (ref 2.5–4.6)

## 2022-08-21 MED ORDER — POTASSIUM CHLORIDE 10 MEQ/100ML IV SOLN
10.0000 meq | INTRAVENOUS | Status: AC
  Administered 2022-08-21 (×4): 10 meq via INTRAVENOUS
  Filled 2022-08-21 (×3): qty 100

## 2022-08-21 MED ORDER — FUROSEMIDE 10 MG/ML IJ SOLN
40.0000 mg | Freq: Once | INTRAMUSCULAR | Status: AC
  Administered 2022-08-21: 40 mg via INTRAVENOUS
  Filled 2022-08-21: qty 4

## 2022-08-21 MED ORDER — ACETAMINOPHEN 650 MG RE SUPP: 650.0000 mg | Freq: Four times a day (QID) | RECTAL | Status: DC | PRN

## 2022-08-21 MED ORDER — ACETAMINOPHEN 325 MG PO TABS
650.0000 mg | ORAL_TABLET | Freq: Four times a day (QID) | ORAL | Status: DC | PRN
  Administered 2022-08-21 – 2022-09-03 (×19): 650 mg
  Filled 2022-08-21 (×19): qty 2

## 2022-08-21 MED ORDER — OXYCODONE HCL 5 MG PO TABS
2.5000 mg | ORAL_TABLET | ORAL | Status: DC | PRN
  Administered 2022-08-21: 2.5 mg

## 2022-08-21 MED ORDER — OXYCODONE HCL 5 MG PO TABS
5.0000 mg | ORAL_TABLET | ORAL | Status: DC | PRN
  Administered 2022-08-21 – 2022-08-27 (×20): 5 mg
  Filled 2022-08-21 (×20): qty 1

## 2022-08-21 MED ORDER — POTASSIUM CHLORIDE 20 MEQ PO PACK
20.0000 meq | PACK | ORAL | Status: AC
  Administered 2022-08-21 (×2): 20 meq
  Filled 2022-08-21 (×2): qty 1

## 2022-08-21 MED ORDER — CARVEDILOL 3.125 MG PO TABS
6.2500 mg | ORAL_TABLET | Freq: Two times a day (BID) | ORAL | Status: DC
  Administered 2022-08-21 – 2022-08-23 (×4): 6.25 mg
  Filled 2022-08-21 (×4): qty 2

## 2022-08-21 MED ORDER — GERHARDT'S BUTT CREAM
TOPICAL_CREAM | Freq: Every day | CUTANEOUS | Status: DC
  Administered 2022-08-22 – 2022-08-29 (×2): 1 via TOPICAL
  Filled 2022-08-21 (×2): qty 1

## 2022-08-21 MED ORDER — CARVEDILOL 3.125 MG PO TABS: 6.2500 mg | ORAL_TABLET | Freq: Two times a day (BID) | ORAL | Status: DC

## 2022-08-21 NOTE — Progress Notes (Signed)
NAME:  [name], MRN:  [id number], DOB:  November 03, 1955, LOS: 12 ADMISSION DATE:  08/09/2022, CONSULTATION DATE:  08/19/2022 REFERRING MD:  Darrick Meigs [facility]), CHIEF COMPLAINT:  Dyspnea, encephalopathy  History of Present Illness:  67 yo F PMH mild cognitive decline, bipolar disorder, recurrent UTIs, scoliosis with prior lumbar fusion who was admitted 1/22 for complex lumbar wound/visible lumbar spine hardware.   Has been admitted twice recently prior to current admission -- first for eval of back pain found to be unstable fracture at prior lumbar fusion site requiring PLIF L1-L5 on 12/9. [facility].  At SNF, wound dehisced and pt presented back to ED same day and was readmitted 12/21-12/29 (second admission), then dc back to SNF.  Her complex lumbar wound further deteriorated at SNF and she presented back to ED 1/22 with exposed lumbar hardware and was admitted (current admission).  She went to OR 1/22 for revision of L1-L5 fusion, removal of left L1-L5 screws and rod, and removal of right L1 screw, revision of L1-L5 posterior instrumented fusion.  She has been on doxy starting 1/24.  Current admission course has been complicated by encephalopathy which initially seemed 2/2 polypharmacy, then thought possible UTI, and Cefadroxil was added 1/26.  CNS depressing medications were also de-escalated.  Despite these measures, Pt become increasingly encephalopathic.  TRH was consulted 1/30 in this setting. She started spiking low grade temps 1/30 (100.7), and became febrile to 103 on 1/31, at which point abx were changed to vanc and zosyn.  Concomitantly, pt has had progressively worsening dyspnea and tachycardia.  CT lumbar spine did not show any surgical site infection.  [name] and ID were consulted on 2/1 in this setting.  MR brain concerning for ventriculitis or meningitis and continued on empiric treatment.  CTA chest concerning for PNA, continued on metronidazole for aspiration PNA.  Pertinent   Medical History  - Bipolar disorder - Mild cog impairment  - Recurrent UTI - Complex Lumbar spine Hx as noted above - T2DM - GERD  Significant Hospital Events: Including procedures, antibiotic start and stop dates in addition to other pertinent events  12/9 - PLIF for unstable L fracture through prior L spine fusion. Admitted 12/9-12/21, dc to SNF 12/21 12/21 - Readmitted 12/21-12/29 for wound dehiscence and dc back to SNF 1/22 - Readmitted for exposed L hardware; OR for removal of left L1-L5 screws, rod and right L1 screw. 1/22 - Started doxycycline. 1/23 - Encephalopathic 1/25 - Started ciprofloxacin 1/26 - Stopped ciprofloxacin, started cefadroxil for possible UTI  1/30 - New leukocytosis, ongoing confusion, fever; TRH consult 1/31 - Worse fever and confusion; abx change to vanc and zosyn 2/1 - Dyspnea, tachycardia. WBC 17.5.  [name] and ID consults. Abx changed to vanc, cefepime, and metronidazole. Admitted to ICU.  Hypoxemic, intubated.  CTA chest concerning for aspiration PNA. 2/2 - MR brain concerning for meningitis, continue empiric treatment w/ vanc and cefepime  Interim History / Subjective:  Patient started spiking fever with Tmax 100.7 No overnight issues Tolerating spontaneous breathing trial  Objective   Blood pressure 121/77, pulse (!) 105, temperature (!) 100.7 F (38.2 C), temperature source Axillary, resp. rate (!) 21, height 5' (1.524 m), weight 67.6 kg, SpO2 99 %.    Vent Mode: PSV;CPAP FiO2 (%):  [30 %-40 %] 30 % Set Rate:  [20 bmp] 20 bmp Vt Set:  [360 mL] 360 mL PEEP:  [5 cmH20] 5 cmH20 Pressure Support:  [5 cmH20] 5 cmH20 Plateau Pressure:  [8 YJE56-31  cmH20] 8 cmH20   Intake/Output Summary (Last 24 hours) at 08/21/2022 0905 Last data filed at 08/21/2022 0600 Gross per 24 hour  Intake 2132.55 ml  Output 875 ml  Net 1257.55 ml   Filed Weights   08/06/22 1541 08/09/22 0931  Weight: 65.3 kg 67.6 kg     Physical exam: General: Crtitically  ill-appearing female, orally intubated HEENT: [place]/AT, eyes anicteric.  ETT and OGT in place Neuro: Opens eyes with vocal stimuli, not following commands, generalized weak Chest: Coarse breath sounds, no wheezes or rhonchi Heart: Regular rate and rhythm, no murmurs or gallops Abdomen: Soft, nondistended, bowel sounds present Skin: Would vac present on back, draining well.  No rashes  Labs and images were reviewed  Assessment & Plan:  Sepsis due to meningitis / ventriculitis  Acute septic encephalopathy MRI brain concerning for meningitis/ ventriculitis LP contraindicated given risk for lumbar surgical site infection due to multiple recent surgeries Continue broad-spectrum antibiotics per discussion with infectious disease, she is on cefepime, metronidazole and vancomycin  Sepsis indices have improved  Acute hypoxic respiratory failure Aspiration pneumonia CTA chest concerning for multifocal pneumonia, suspect aspiration PNA Currently patient is tolerating spontaneous breathing trial, watch for respiratory distress She is on broad-spectrum antibiotics Respiratory culture is growing yeast, likely colonization I do not think she needs to be treated for that VAP prevention bundle in place Sedation is off  Complex Lumbar wound (exposed hardware s/p removal of L L1-L5 screws, rod, R L1 screw on 1/22) No concern for abscess given MR and CT lumbar spine.  Negative pressure wound vac in place at surgical site. Appreciate neurosurgery follow-up   Hyponatremia/hypokalemia Stop IV fluid Continue aggressive electrolyte supplement and monitor  Anemia of critical illness Monitoring H&H, transfuse if less than 7  T2DM  Patient hemoglobin A1c 6 Hold metformin Continue sliding scale insulin with CBG goal 140-180   Chronic HFrEF HTN CAD, non-obstructive  Repeat echocardiogram consistent with stable ejection fraction of 40-45% Monitor intake and output Will give her 1 dose of Lasix 40  mg Resume Coreg Hold Cozaar  Mild cog deficit   Bipolar disorder Continue donepezil, fluvoxumine and lamotrigine Hold gabapentin  Best Practice (right click and "Reselect all SmartList Selections" daily)   Diet/type: tubefeeds DVT prophylaxis: prophylactic heparin  GI prophylaxis: PPI Lines: N/A Foley:  Yes, and it is still needed Code Status:  full code Last date of multidisciplinary goals of care discussion: 2/3 patient's daughters and son was updated at bedside  This patient is critically ill with multiple organ system failure which requires frequent high complexity decision making, assessment, support, evaluation, and titration of therapies. This was completed through the application of advanced monitoring technologies and extensive interpretation of multiple databases.  During this encounter critical care time was devoted to patient care services described in this note for 38 minutes.    Jacky Kindle, MD [facility] See Amion for pager If no response to pager, please call [phone] until 7pm After 7pm, Please call E-link [phone]

## 2022-08-21 NOTE — Progress Notes (Signed)
Patient ID: [name], female   DOB: 09-17-1955, 67 y.o.   MRN: [id number] Extended visit today to discuss patient concerns with the family which were all present this morning at the visit this included 2 daughters son the patient's husband.  Patient also was present though arousable intubated.  Family voiced concerns over medical care prior to her deterioration.  The elder daughter was concerned that something else was going on for several days but was only being told that the patient was dehydrated.  They are forcing fluids and they also were trying to cut the patient's medications as the elder daughter noted that she seemed to have altered mental status for a number of days before she deteriorated.  I reviewed the patient's chart noting my last visit last week and my concern that she had some rales at the bases where I ordered the chest x-ray.  She subsequently continued to have some difficulties and wound drainage and review of the most recent MRI suggest that she might have some meningitis.  The patient is currently on broad based antibiotic coverage in addition to Flagyl she does appear to be making some progress with some improved alertness though still on the ventilator and white count appears to be decreasing along with temperatures.  A wound VAC dressing is in place superficially.  It has been 4 days since it has been in place and family asked when it might be changed.  Wound ostomy nurse may get by tomorrow or Monday for that purpose.  Expressed my understanding of their concerns they asked questions regarding the spinal fluid leakage and the nature of how it was sealed and I explained to them that it was sealed with bone wax and a manufactured collagen product usually either Gelfoam or DuraGen allows this process to heal understand her concerns for infection and at this point no other intervention than what is being done with the antibiotics can be provided but as she appears to be  defervesced single cautiously watch her situation.  If she needs further surgery in her lumbar spine remains to be determined.  Visit with family lasted approximately 30 minutes including ischial discussion with husband before the children arrived another 30 minutes was spent reviewing the patient's chart to get up-to-date information.

## 2022-08-21 NOTE — Progress Notes (Signed)
[facility]  Date of Admission:  08/09/2022           Reason for visit: Follow up on spinal hardware infection with meningitis  Current antibiotics: Vancomycin Cefepime Metronidazole   ASSESSMENT:    67 y.o. female admitted with:  History of L1-L5 fracture stabilization and PLIF in December 2023 complicated by wound dehiscence with exposed hardware: Status post revision of lumbar wound with removal of left-sided L1-5 lumbar screws and plate as well as removal of right L1 screw and revision of the L1-L5 posterior instrumented fusion on 08/09/2022. Concern for CNS infection: MRI 08/20/2022 showed a punctate focus of ischemia in the right frontal lobe as well as abnormality within the occipital horn of the left lateral ventricle possibly indicating proteinaceous/purulent debris.  LP unable to be performed.  Acute hypoxic respiratory failure and multifocal pneumonia: Seen on CTA chest 08/19/22.  Currently intubated.   RECOMMENDATIONS:    Continue vancomycin Continue cefepime Continue metronidazole Follow tracheal aspirate cultures.  Would not add antifungal coverage to target the yeast noted on Gram stain as this is likely not of particular significance Will continue to follow   Principal Problem:   Wound dehiscence Active Problems:   Encephalopathy acute   Aspiration pneumonia of both lungs (HCC)   Acute respiratory failure with hypoxia (HCC)   Sepsis (HCC)   Seizure (HCC)   Malnutrition of moderate degree   Bacterial meningitis   Hardware complicating wound infection (HCC)   Infection of deep incisional surgical site after procedure    MEDICATIONS:    Scheduled Meds:  Chlorhexidine Gluconate Cloth  6 each Topical Daily   docusate  100 mg Per Tube BID   donepezil  10 mg Per Tube Daily   famotidine  20 mg Per Tube BID   feeding supplement (PROSource TF20)  60 mL Per Tube Daily   heparin  5,000 Units Subcutaneous Q8H   insulin aspart  0-9  Units Subcutaneous Q4H   lamoTRIgine  200 mg Per Tube Daily   levothyroxine  88 mcg Per Tube QAC breakfast   multivitamin with minerals  1 tablet Per Tube Daily   mouth rinse  15 mL Mouth Rinse Q2H   polyethylene glycol  17 g Per Tube Daily   sodium chloride flush  10-40 mL Intracatheter Q12H   sodium chloride flush  3 mL Intravenous Q12H   sodium chloride  1 g Per Tube BID WC   thiamine  100 mg Per Tube Daily   Continuous Infusions:  ceFEPime (MAXIPIME) IV Stopped (08/21/22 0051)   feeding supplement (OSMOLITE 1.5 CAL) 25 mL/hr at 08/21/22 0600   metronidazole Stopped (08/21/22 0127)   propofol (DIPRIVAN) infusion 50 mcg/kg/min (08/21/22 0600)   sodium chloride     vancomycin Stopped (08/20/22 2232)   PRN Meds:.acetaminophen **OR** acetaminophen, diphenoxylate-atropine, fentaNYL (SUBLIMAZE) injection, fentaNYL (SUBLIMAZE) injection, menthol-cetylpyridinium **OR** phenol, ondansetron **OR** ondansetron (ZOFRAN) IV, mouth rinse, oxyCODONE, polyethylene glycol, sodium chloride flush, sodium chloride flush  SUBJECTIVE:   24 hour events:  Low grade fever this AM at 100.7 Ventilated 1/30 blood cultures no growth Respiratory panel pending Tracheal aspirate culture from yesterday Gram stain with yeast, cultures pending   Patient remains ventilated and sedated.  Her children are at the bedside along with RN and CCM.  Review of Systems  Unable to perform ROS: Intubated      OBJECTIVE:   Blood pressure 119/76, pulse (!) 106, temperature 98.8 F (37.1 C), temperature source Oral,  resp. rate 20, height 5' (1.524 m), weight 67.6 kg, SpO2 100 %. Body mass index is 29.1 kg/m.  Physical Exam Constitutional:      Comments: 67 yo woman, lying in the ICU, intubated, sedated, family at bedside.   Pulmonary:     Comments: Appears comfortable on ventilator. Symmetric chest rise and fall. Abdominal:     General: There is no distension.  Genitourinary:    Comments: Foley catheter in  place Musculoskeletal:     Comments: Right arm PICC line in place  Skin:    General: Skin is warm and dry.     Findings: No rash.  Psychiatric:     Comments: Sedated.      Lab Results: Lab Results  Component Value Date   WBC 13.2 (H) 08/20/2022   HGB 9.3 (L) 08/20/2022   HCT 27.3 (L) 08/20/2022   MCV 93.2 08/20/2022   PLT 668 (H) 08/20/2022    Lab Results  Component Value Date   NA 130 (L) 08/21/2022   K 3.4 (L) 08/21/2022   CO2 19 (L) 08/21/2022   GLUCOSE 152 (H) 08/21/2022   BUN 8 08/21/2022   CREATININE <0.30 (L) 08/21/2022   CALCIUM 6.9 (L) 08/21/2022   GFRNONAA NOT CALCULATED 08/21/2022   GFRAA >60 02/28/2018    Lab Results  Component Value Date   ALT 26 08/20/2022   AST 66 (H) 08/20/2022   ALKPHOS 57 08/20/2022   BILITOT 0.7 08/20/2022       Component Value Date/Time   CRP 4.0 (H) 08/19/2022 1515       Component Value Date/Time   ESRSEDRATE 37 (H) 08/19/2022 1515     I have reviewed the micro and lab results in Epic.  Imaging: ECHOCARDIOGRAM LIMITED  Result Date: 08/20/2022    ECHOCARDIOGRAM LIMITED REPORT   Patient Name:   LAKEASHA PETION Halling Date of Exam: 08/20/2022 Medical Rec #:  [id number]           Height:       60.0 in Accession #:    [id number]          Weight:       149.0 lb Date of Birth:  04/19/1956           BSA:          1.647 m Patient Age:    86 years            BP:           104/69 mmHg Patient Gender: F                   HR:           89 bpm. Exam Location:  Inpatient Procedure: Limited Echo, Color Doppler, Limited Color Doppler and Intracardiac            Opacification Agent Indications:    CHF  History:        Patient has prior history of Echocardiogram examinations, most                 recent 04/13/2019. Risk Factors:Hypertension, Diabetes and                 Current Smoker.  Sonographer:    Harvie Junior Referring Phys: [id number] GRACE E BOWSER  Sonographer Comments: Technically difficult study due to poor echo windows and echo performed  with patient supine and on artificial respirator. Image acquisition challenging due to respiratory motion and restraints. IMPRESSIONS  1. Abnormal  septal motion with septal and apical hypokinesis . Left ventricular ejection fraction, by estimation, is 40 to 45%. The left ventricle has mildly decreased function. The left ventricle has no regional wall motion abnormalities. The left ventricular internal cavity size was mildly dilated. Left ventricular diastolic parameters were normal.  2. Right ventricular systolic function is severely reduced. The right ventricular size is severely enlarged.  3. The mitral valve is abnormal. No evidence of mitral valve regurgitation. No evidence of mitral stenosis.  4. The aortic valve was not well visualized. There is mild calcification of the aortic valve. There is mild thickening of the aortic valve. Aortic valve regurgitation is not visualized. Aortic valve sclerosis is present, with no evidence of aortic valve  stenosis.  5. The inferior vena cava is normal in size with greater than 50% respiratory variability, suggesting right atrial pressure of 3 mmHg. FINDINGS  Left Ventricle: Abnormal septal motion with septal and apical hypokinesis. Left ventricular ejection fraction, by estimation, is 40 to 45%. The left ventricle has mildly decreased function. The left ventricle has no regional wall motion abnormalities. Definity contrast agent was given IV to delineate the left ventricular endocardial borders. The left ventricular internal cavity size was mildly dilated. There is no left ventricular hypertrophy. Left ventricular diastolic parameters were normal. Right Ventricle: The right ventricular size is severely enlarged. No increase in right ventricular wall thickness. Right ventricular systolic function is severely reduced. Left Atrium: Left atrial size was normal in size. Right Atrium: Right atrial size was normal in size. Pericardium: There is no evidence of pericardial  effusion. Mitral Valve: The mitral valve is abnormal. There is mild thickening of the mitral valve leaflet(s). There is mild calcification of the mitral valve leaflet(s). Mild mitral annular calcification. No evidence of mitral valve stenosis. Tricuspid Valve: The tricuspid valve is normal in structure. Tricuspid valve regurgitation is mild . No evidence of tricuspid stenosis. Aortic Valve: The aortic valve was not well visualized. There is mild calcification of the aortic valve. There is mild thickening of the aortic valve. Aortic valve regurgitation is not visualized. Aortic valve sclerosis is present, with no evidence of aortic valve stenosis. Aortic valve mean gradient measures 3.0 mmHg. Aortic valve peak gradient measures 5.7 mmHg. Pulmonic Valve: The pulmonic valve was normal in structure. Pulmonic valve regurgitation is not visualized. No evidence of pulmonic stenosis. Aorta: The aortic root is normal in size and structure. Venous: The inferior vena cava is normal in size with greater than 50% respiratory variability, suggesting right atrial pressure of 3 mmHg. IAS/Shunts: The interatrial septum appears to be lipomatous. No atrial level shunt detected by color flow Doppler. LEFT VENTRICLE PLAX 2D LVIDd:         4.40 cm     Diastology LVIDs:         3.00 cm     LV e' medial:    5.98 cm/s LV PW:         1.10 cm     LV E/e' medial:  12.0 LV IVS:        1.00 cm     LV e' lateral:   7.29 cm/s                            LV E/e' lateral: 9.8  LV Volumes (MOD) LV vol d, MOD A2C: 28.2 ml LV vol d, MOD A4C: 85.0 ml LV vol s, MOD A2C: 14.9 ml LV vol s, MOD A4C:  44.0 ml LV SV MOD A2C:     13.3 ml LV SV MOD A4C:     85.0 ml LV SV MOD BP:      25.2 ml RIGHT VENTRICLE RV Basal diam:  3.80 cm RV Mid diam:    3.80 cm RV FAC:         -54.7 % LEFT ATRIUM         Index LA diam:    3.30 cm 2.00 cm/m  AORTIC VALVE AV Vmax:           119.00 cm/s AV Vmean:          75.800 cm/s AV VTI:            0.167 m AV Peak Grad:      5.7 mmHg  AV Mean Grad:      3.0 mmHg LVOT Vmax:         71.80 cm/s LVOT Vmean:        46.900 cm/s LVOT VTI:          0.123 m LVOT/AV VTI ratio: 0.74 MITRAL VALVE               TRICUSPID VALVE MV Area (PHT): 4.86 cm    TR Peak grad:   22.1 mmHg MV Decel Time: 156 msec    TR Vmax:        235.00 cm/s MV E velocity: 71.60 cm/s MV A velocity: 78.00 cm/s  SHUNTS MV E/A ratio:  0.92        Systemic VTI: 0.12 m Charlton Haws MD Electronically signed by Charlton Haws MD Signature Date/Time: 08/20/2022/4:37:27 PM    Final    DG Abd Portable 1V  Result Date: 08/20/2022 CLINICAL DATA:  Encounter for feeding tube placement. EXAM: PORTABLE ABDOMEN - 1 VIEW COMPARISON:  None Available. FINDINGS: Tip of the weighted enteric tube is below the diaphragm in the midline in the region of the mid distal stomach. Nonobstructed upper abdominal bowel gas pattern. Scoliosis and lumbar degenerative change. IMPRESSION: Tip of the weighted enteric tube in the mid distal stomach. Electronically Signed   By: Narda Rutherford M.D.   On: 08/20/2022 11:39   [place] EKG SITE RITE  Result Date: 08/20/2022 If [facility] image not attached, placement could not be confirmed due to current cardiac rhythm.  MR Lumbar Spine W Wo Contrast  Result Date: 08/20/2022 CLINICAL DATA:  Recent spinal surgery.  Possible postop infection. EXAM: MRI LUMBAR SPINE WITHOUT AND WITH CONTRAST TECHNIQUE: Multiplanar and multiecho pulse sequences of the lumbar spine were obtained without and with intravenous contrast. CONTRAST:  77mL GADAVIST GADOBUTROL 1 MMOL/ML IV SOLN COMPARISON:  06/25/2022 FINDINGS: Segmentation:  Standard. Alignment: Lumbar levoscoliosis with apex at L2. grade 1 anterolisthesis at L5-S1 Vertebrae: L2-5 right-sided posterior fusion. Bone marrow edema at L3 has decreased. No new site of bone marrow signal abnormality or enhancement. No acute fracture. Conus medullaris and cauda equina: Conus extends to the L1 level. Conus and cauda equina appear normal. Paraspinal  and other soft tissues: Dorsal postoperative changes. Disc levels: L1-2: No spinal canal or neural foraminal stenosis. L2-3: Right transpedicular screws. Widely patent spinal canal. No neural impingement. L3-4: Right transpedicular screws. No spinal canal or neural foraminal stenosis. L4-5: Right L4 transpedicular screw. Widely patent spinal canal. No foraminal stenosis. L5-S1: No spinal canal or neural foraminal stenosis. Visualized sacrum: Normal. IMPRESSION: 1. L2-5 right-sided posterior fusion with widely patent spinal canal and neural foramina. 2. Decreased bone marrow edema at L3. No  new site of bone marrow signal abnormality or enhancement. Electronically Signed   By: Deatra Robinson M.D.   On: 08/20/2022 03:50   MR BRAIN W WO CONTRAST  Result Date: 08/20/2022 CLINICAL DATA:  Delirium EXAM: MRI HEAD WITHOUT AND WITH CONTRAST TECHNIQUE: Multiplanar, multiecho pulse sequences of the brain and surrounding structures were obtained without and with intravenous contrast. CONTRAST:  24mL GADAVIST GADOBUTROL 1 MMOL/ML IV SOLN COMPARISON:  Head CT 08/19/2022 Brain MRI 02/01/2017 FINDINGS: Brain: There are punctate foci of abnormal diffusion restriction within the right frontal lobe and in the occipital horn of the left lateral ventricle. The former is likely a punctate focus of ischemia but the latter may be proteinaceous or purulent debris. No chronic microhemorrhage or siderosis. There is multifocal hyperintense T2-weighted signal within the white matter. Generalized volume loss. The midline structures are normal. There is no abnormal contrast enhancement. Vascular: Normal flow voids. Skull and upper cervical spine: Normal marrow signal. Sinuses/Orbits: Right mastoid fluid. Bilateral ocular lens replacements. Paranasal sinuses are clear. Other: None IMPRESSION: 1. Punctate focus of acute ischemia in the right frontal lobe. 2. Diffusion abnormality within the occipital horn of the left lateral ventricle may indicate  proteinaceous/purulent debris. Blood considered less likely given the absence of SWI abnormality and lack of hemorrhage elsewhere. 3. No hemorrhage or mass effect. Electronically Signed   By: Deatra Robinson M.D.   On: 08/20/2022 03:44   DG CHEST PORT 1 VIEW  Result Date: 08/19/2022 CLINICAL DATA:  Check endotracheal tube EXAM: PORTABLE CHEST 1 VIEW COMPARISON:  08/18/2022 FINDINGS: Endotracheal tube is noted 1.8 cm above the carina. Gastric catheter extends into the stomach. Cardiac shadow is stable. Mild central vascular congestion is noted without edema. Mild atelectatic changes are seen in the bases. No acute bony abnormality is noted. IMPRESSION: Tubes and lines in satisfactory position. Increased central vascular congestion with basilar atelectasis. Electronically Signed   By: Alcide Clever M.D.   On: 08/19/2022 18:17   EEG adult  Result Date: 08/19/2022 Charlsie Quest, MD     08/19/2022  5:47 PM Patient Name: BRYNNLEY DAYRIT MRN: [id number] Epilepsy Attending: Charlsie Quest Referring Physician/Provider: Lanier Clam, NP Date: 08/19/2022 Duration: 24.34 mins Patient history: 67yo F with ams. EEG to evaluate for seizure Level of alertness: Awake AEDs during EEG study: LTG, Propofol Technical aspects: This EEG study was done with scalp electrodes positioned according to the 10-20 International system of electrode placement. Electrical activity was reviewed with band pass filter of 1-70Hz , sensitivity of 7 uV/mm, display speed of 67mm/sec with a 60Hz  notched filter applied as appropriate. EEG data were recorded continuously and digitally stored.  Video monitoring was available and reviewed as appropriate. Description: EEG showed continuous generalized 3 to 6 Hz theta-delta slowing.  Hyperventilation and photic stimulation were not performed.   ABNORMALITY - Continuous slow, generalized IMPRESSION: This study is suggestive of moderate diffuse encephalopathy, nonspecific etiology. No seizures or  epileptiform discharges were seen throughout the recording.   CT LUMBAR SPINE W CONTRAST  Result Date: 08/19/2022 CLINICAL DATA:  Low back pain, infection suspected EXAM: CT LUMBAR SPINE WITH CONTRAST TECHNIQUE: Multidetector CT imaging of the lumbar spine was performed with intravenous contrast administration. RADIATION DOSE REDUCTION: This exam was performed according to the departmental dose-optimization program which includes automated exposure control, adjustment of the mA and/or kV according to patient size and/or use of iterative reconstruction technique. CONTRAST:  58mL OMNIPAQUE IOHEXOL 350 MG/ML SOLN COMPARISON:  08/06/2022 CT  lumbar spine FINDINGS: Segmentation: 5 lumbar type vertebrae. Alignment: Unchanged scoliosis, centered at L1. Redemonstrated fused grade 1 anterolisthesis of L5 on S1. Vertebrae: Osteopenia. Posterior fixation L2-L5. Redemonstrated subacute fracture through the L3 vertebral body. No additional acute fracture. No evidence of osteomyelitis. Interval removal of the bilateral L1 screws and left L2-L5 screws. Tracts from prior S1 screws. There remains solid osseous fusion of L1-S1. Postsurgical changes are again noted in both iliac wings consistent with bone graft donor sites. Paraspinal and other soft tissues: Increased air and fluid in the posterior soft tissues, not unexpected in the setting of recent surgery. Vascular calcifications. Diverticulosis without diverticulitis. Disc levels: Unchanged appearance of the lumbar disc spaces, osseous spinal canal, and neural foramina, without significant stenosis. IMPRESSION: 1. Interval removal of the bilateral L1 screws and left L2-L5 screws. 2. Redemonstrated subacute fracture through the L3 vertebral body. No additional acute fracture. No evidence of osteomyelitis. 3. Increased air and fluid in the posterior soft tissues, not unexpected in the setting of recent surgery. Electronically Signed   By: Wiliam Ke M.D.   On:  08/19/2022 17:06   CT Angio Chest Pulmonary Embolism (PE) W or WO Contrast  Result Date: 08/19/2022 CLINICAL DATA:  High probability for PE. Low back pain. Infection suspected. EXAM: CT ANGIOGRAPHY CHEST WITH CONTRAST TECHNIQUE: Multidetector CT imaging of the chest was performed using the standard protocol during bolus administration of intravenous contrast. Multiplanar CT image reconstructions and MIPs were obtained to evaluate the vascular anatomy. RADIATION DOSE REDUCTION: This exam was performed according to the departmental dose-optimization program which includes automated exposure control, adjustment of the mA and/or kV according to patient size and/or use of iterative reconstruction technique. CONTRAST:  54mL OMNIPAQUE IOHEXOL 350 MG/ML SOLN COMPARISON:  Chest x-ray 08/18/2022 FINDINGS: Cardiovascular: Heart is mildly enlarged. Aorta is normal in size. There are atherosclerotic calcifications of the aorta. There is adequate opacification of the pulmonary arteries to the segmental level. There is no evidence for pulmonary embolism. Mediastinum/Nodes: There is an enlarged right hilar lymph node measuring 12 mm short axis. There are additional nonenlarged hilar and mediastinal lymph nodes. The esophagus and visualized thyroid gland are within normal limits. Lungs/Pleura: There are small bilateral pleural effusions, right greater than left. Multifocal patchy airspace and ground-glass opacities are seen throughout the right middle lobe, right upper lobe and minimally in the left upper lobe. There is also small amount of patchy airspace disease in the bilateral lower lobes. There is central peribronchial wall thickening bilaterally. No pneumothorax. Upper Abdomen: No acute abnormality. Musculoskeletal: Left shoulder arthroplasty is present. There are mildly displaced posterolateral right eighth and tenth rib fractures which appear acute or subacute. There is a nondisplaced posterolateral right ninth rib  fracture. There is dextroconvex scoliosis of the thoracolumbar spine. Review of the MIP images confirms the above findings. IMPRESSION: 1. No evidence for pulmonary embolism. 2. Small bilateral pleural effusions, right greater than left. 3. Multifocal patchy airspace and ground-glass opacities throughout the right upper lobe, right middle lobe and bilateral lower lobes worrisome for multifocal pneumonia. 4. Central peribronchial wall thickening compatible with bronchitis. 5. Right hilar lymphadenopathy, likely reactive. 6. Acute or subacute right ninth through tenth rib fractures. Aortic Atherosclerosis (ICD10-I70.0). Electronically Signed   By: Darliss Cheney M.D.   On: 08/19/2022 16:29   CT HEAD WO CONTRAST ( )  Result Date: 08/19/2022 CLINICAL DATA:  Delirium. EXAM: CT HEAD WITHOUT CONTRAST TECHNIQUE: Contiguous axial images were obtained from the base of the skull through the vertex without  intravenous contrast. RADIATION DOSE REDUCTION: This exam was performed according to the departmental dose-optimization program which includes automated exposure control, adjustment of the mA and/or kV according to patient size and/or use of iterative reconstruction technique. COMPARISON:  Head CT 02/27/2018. FINDINGS: Brain: No acute hemorrhage. Unchanged chronic small-vessel disease. Cortical gray-white differentiation is otherwise preserved. Prominence of the ventricles and sulci within normal limits for age. No extra-axial collection. Basilar cisterns are patent. Vascular: No hyperdense vessel or unexpected calcification. Skull: No calvarial fracture or suspicious bone lesion. Skull base is unremarkable. Sinuses/Orbits: Paranasal sinuses, mastoid air cells, and middle ear cavities are well aerated. Orbits are unremarkable. Other: None. IMPRESSION: 1. No acute intracranial abnormality. 2. Unchanged chronic small-vessel disease. Electronically Signed   By: Emmit Alexanders M.D.   On: 08/19/2022 16:23     Imaging   independently reviewed in Epic.    [facility] [phone] pager 08/21/2022, 8:02 AM  I have personally spent 50 minutes involved in face-to-face and non-face-to-face activities for this patient on the day of the visit. Professional time spent includes the following activities: Preparing to see the patient (review of tests), Obtaining and/or reviewing separately obtained history (admission/discharge record), Performing a medically appropriate examination and/or evaluation , Ordering medications/tests/procedures, referring and communicating with other health care professionals, Documenting clinical information in the EMR, Independently interpreting results (not separately reported), Communicating results to the patient/family/caregiver, Counseling and educating the patient/family/caregiver and Care coordination (not separately reported).

## 2022-08-22 ENCOUNTER — Inpatient Hospital Stay (HOSPITAL_COMMUNITY): Payer: Medicare Other

## 2022-08-22 DIAGNOSIS — T8130XA Disruption of wound, unspecified, initial encounter: Secondary | ICD-10-CM | POA: Diagnosis not present

## 2022-08-22 LAB — COMPREHENSIVE METABOLIC PANEL
ALT: 19 U/L (ref 0–44)
AST: 20 U/L (ref 15–41)
Albumin: 1.8 g/dL — ABNORMAL LOW (ref 3.5–5.0)
Alkaline Phosphatase: 67 U/L (ref 38–126)
Anion gap: 11 (ref 5–15)
BUN: 5 mg/dL — ABNORMAL LOW (ref 8–23)
CO2: 22 mmol/L (ref 22–32)
Calcium: 6.7 mg/dL — ABNORMAL LOW (ref 8.9–10.3)
Chloride: 100 mmol/L (ref 98–111)
Creatinine, Ser: <0.3 mg/dL — ABNORMAL LOW (ref 0.44–1.00)
Glucose, Bld: 114 mg/dL — ABNORMAL HIGH (ref 70–99)
Potassium: 2.9 mmol/L — ABNORMAL LOW (ref 3.5–5.1)
Sodium: 133 mmol/L — ABNORMAL LOW (ref 135–145)
Total Bilirubin: 0.2 mg/dL — ABNORMAL LOW (ref 0.3–1.2)
Total Protein: 4.6 g/dL — ABNORMAL LOW (ref 6.5–8.1)

## 2022-08-22 LAB — GLUCOSE, CAPILLARY
Glucose-Capillary: 148 mg/dL — ABNORMAL HIGH (ref 70–99)
Glucose-Capillary: 123 mg/dL — ABNORMAL HIGH (ref 70–99)
Glucose-Capillary: 165 mg/dL — ABNORMAL HIGH (ref 70–99)
Glucose-Capillary: 125 mg/dL — ABNORMAL HIGH (ref 70–99)
Glucose-Capillary: 123 mg/dL — ABNORMAL HIGH (ref 70–99)
Glucose-Capillary: 173 mg/dL — ABNORMAL HIGH (ref 70–99)

## 2022-08-22 LAB — CBC WITH DIFFERENTIAL/PLATELET
Abs Immature Granulocytes: 0.48 10*3/uL — ABNORMAL HIGH (ref 0.00–0.07)
Basophils Absolute: 0 10*3/uL (ref 0.0–0.1)
Basophils Relative: 0 %
Eosinophils Absolute: 0.3 10*3/uL (ref 0.0–0.5)
Eosinophils Relative: 3 %
HCT: 26 % — ABNORMAL LOW (ref 36.0–46.0)
Hemoglobin: 8.9 g/dL — ABNORMAL LOW (ref 12.0–15.0)
Immature Granulocytes: 4 %
Lymphocytes Relative: 14 %
Lymphs Abs: 1.7 10*3/uL (ref 0.7–4.0)
MCH: 32.2 pg (ref 26.0–34.0)
MCHC: 34.2 g/dL (ref 30.0–36.0)
MCV: 94.2 fL (ref 80.0–100.0)
Monocytes Absolute: 1 10*3/uL (ref 0.1–1.0)
Monocytes Relative: 8 %
Neutro Abs: 8.8 10*3/uL — ABNORMAL HIGH (ref 1.7–7.7)
Neutrophils Relative %: 71 %
Platelets: 612 10*3/uL — ABNORMAL HIGH (ref 150–400)
RBC: 2.76 MIL/uL — ABNORMAL LOW (ref 3.87–5.11)
RDW: 18.5 % — ABNORMAL HIGH (ref 11.5–15.5)
WBC: 12.3 10*3/uL — ABNORMAL HIGH (ref 4.0–10.5)
nRBC: 0.2 % (ref 0.0–0.2)

## 2022-08-22 LAB — CULTURE, BLOOD (ROUTINE X 2)
Culture: NO GROWTH
Culture: NO GROWTH

## 2022-08-22 LAB — CULTURE, RESPIRATORY W GRAM STAIN

## 2022-08-22 LAB — MAGNESIUM: Magnesium: 1.7 mg/dL (ref 1.7–2.4)

## 2022-08-22 LAB — PHOSPHORUS: Phosphorus: 1.6 mg/dL — ABNORMAL LOW (ref 2.5–4.6)

## 2022-08-22 MED ORDER — POTASSIUM CHLORIDE 20 MEQ PO PACK
60.0000 meq | PACK | Freq: Two times a day (BID) | ORAL | Status: AC
  Administered 2022-08-22 (×2): 60 meq
  Filled 2022-08-22 (×2): qty 3

## 2022-08-22 MED ORDER — IPRATROPIUM-ALBUTEROL 0.5-2.5 (3) MG/3ML IN SOLN
3.0000 mL | Freq: Four times a day (QID) | RESPIRATORY_TRACT | Status: DC
  Administered 2022-08-22 – 2022-08-25 (×12): 3 mL via RESPIRATORY_TRACT
  Filled 2022-08-22 (×13): qty 3

## 2022-08-22 MED ORDER — POTASSIUM CHLORIDE 20 MEQ PO PACK: 60.0000 meq | PACK | Freq: Two times a day (BID) | ORAL | Status: DC

## 2022-08-22 MED ORDER — SODIUM PHOSPHATES 45 MMOLE/15ML IV SOLN
30.0000 mmol | Freq: Once | INTRAVENOUS | Status: AC
  Administered 2022-08-22: 30 mmol via INTRAVENOUS
  Filled 2022-08-22: qty 10

## 2022-08-22 NOTE — Progress Notes (Signed)
Attempted to page [facility] RNs twice during the day after placing new consult order to change wound vac, as instructed by Dr. Ellene Route.  1530 - updated Dr. Ellene Route that I was unable to contact [facility] RN. MD aware and reinstated for RN staff not to change dressing without surgeon at the bedside. Dr. Ellene Route will update his colleagues and reattempt dressing change Monday.  Will order supplies to be at bedside.

## 2022-08-22 NOTE — Progress Notes (Signed)
Patient ID: [name], female   DOB: Jan 03, 1956, 67 y.o.   MRN: [id number] Currently patient is sedated having recently had bath.  Temperature still remains low-grade but stable at 100.5.  CBC drawn this morning shows slight decrease in white count to 12.7.  She does appear to be anemic with her hematocrit decreasing at 26 and hemoglobin down to 8.9.  Would defer need for transfusion to critical care.  VAC dressing will hopefully be changed today.  Respiratory weaning per critical care also.  For now continue supportive care on broad-spectrum antibiotics.  Review of culture results and further incubation of cultures yields no new information.  Additional drainage and VAC dressing is quite modest.  Patient's youngest daughter was present at this early morning visit.

## 2022-08-22 NOTE — Progress Notes (Addendum)
During shift change report, this RN and dayshift RN turned patient to inspect wound vac site and dressing. New leakage from site, wound vac alarm also alarming indicating blockage. Dr. Kathyrn Sheriff paged twice. First page was text page via [place] around [place]. Amion showed a green "text" bubble indicating text page available; Amion gave a confirmation that page had been sent. Second page sent via the neurosurgery answering service at 2019. Page returned at 2025. Dr. Kathyrn Sheriff made aware patient has leakage from wound vac site/dressing, and that wound vac is alarming blockage alarm. MD made aware that wound vac is only operating minimally, if at all with these issues. MD made aware that bedside RNs have been instructed not to change dressing. MD made aware last dressing change was 08/18/2022. MD made aware multiple attempts at contacting [place] nurses were made by dayshift RN. MD made aware dressing has been reinforced over the last couple of days. RN strongly inquired as to whether MD will be coming up to change dressing and address the issue. MD indicated that it would be difficult to get supplies to change dressing tonight. MD made aware that dayshift RN got necessary supplies to change wound vac dressing and they are in the patient's room. MD stated he will not be coming up to tonight to change dressing. MD stated RN may turn off wound vac machine if RN chooses. Family at bedside updated by RN.

## 2022-08-22 NOTE — Progress Notes (Signed)
NAME:  [name], MRN:  [id number], DOB:  [birth year], LOS: 60 ADMISSION DATE:  08/09/2022, CONSULTATION DATE:  08/19/2022 REFERRING MD:  Darrick Meigs [facility]), CHIEF COMPLAINT:  Dyspnea, encephalopathy  History of Present Illness:  67 yo F PMH mild cognitive decline, bipolar disorder, recurrent UTIs, scoliosis with prior lumbar fusion who was admitted 1/22 for complex lumbar wound/visible lumbar spine hardware.   Has been admitted twice recently prior to current admission -- first for eval of back pain found to be unstable fracture at prior lumbar fusion site requiring PLIF L1-L5 on 12/9. Discharged to [facility] 12/21.  At SNF, wound dehisced and pt presented back to ED same day and was readmitted 12/21-12/29 (second admission), then dc back to SNF.  Her complex lumbar wound further deteriorated at SNF and she presented back to ED 1/22 with exposed lumbar hardware and was admitted (current admission).  She went to OR 1/22 for revision of L1-L5 fusion, removal of left L1-L5 screws and rod, and removal of right L1 screw, revision of L1-L5 posterior instrumented fusion.  She has been on doxy starting 1/24.  Current admission course has been complicated by encephalopathy which initially seemed 2/2 polypharmacy, then thought possible UTI, and Cefadroxil was added 1/26.  CNS depressing medications were also de-escalated.  Despite these measures, Pt become increasingly encephalopathic.  TRH was consulted 1/30 in this setting. She started spiking low grade temps 1/30 (100.7), and became febrile to 103 on 1/31, at which point abx were changed to vanc and zosyn.  Concomitantly, pt has had progressively worsening dyspnea and tachycardia.  CT lumbar spine did not show any surgical site infection.  [name] and ID were consulted on 2/1 in this setting.  MR brain concerning for ventriculitis or meningitis and continued on empiric treatment.  CTA chest concerning for PNA, continued on metronidazole for aspiration PNA.  Pertinent   Medical History  - Bipolar disorder - Mild cog impairment  - Recurrent UTI - Complex Lumbar spine Hx as noted above - T2DM - GERD  Significant Hospital Events: Including procedures, antibiotic start and stop dates in addition to other pertinent events  12/9 - PLIF for unstable L fracture through prior L spine fusion. Admitted 12/9-12/21, dc to SNF 12/21 12/21 - Readmitted 12/21-12/29 for wound dehiscence and dc back to SNF 1/22 - Readmitted for exposed L hardware; OR for removal of left L1-L5 screws, rod and right L1 screw. 1/22 - Started doxycycline. 1/23 - Encephalopathic 1/25 - Started ciprofloxacin 1/26 - Stopped ciprofloxacin, started cefadroxil for possible UTI  1/30 - New leukocytosis, ongoing confusion, fever; TRH consult 1/31 - Worse fever and confusion; abx change to vanc and zosyn 2/1 - Dyspnea, tachycardia. WBC 17.5.  [name] and ID consults. Abx changed to vanc, cefepime, and metronidazole. Admitted to ICU.  Hypoxemic, intubated.  CTA chest concerning for aspiration PNA. 2/2 - MR brain concerning for meningitis, continue empiric treatment w/ vanc and cefepime  Interim History / Subjective:  Patient started spiking fever with Tmax 100.5 No overnight issues  Increased work of breathing on SBT this morning  Objective   Blood pressure 138/86, pulse 90, temperature (!) 100.5 F (38.1 C), temperature source Axillary, resp. rate (!) 23, height 5' (1.524 m), weight 67.6 kg, SpO2 96 %.    Vent Mode: PRVC FiO2 (%):  [30 %] 30 % Set Rate:  [20 bmp] 20 bmp Vt Set:  [360 mL] 360 mL PEEP:  [5 cmH20] 5 cmH20 Pressure Support:  [5 cmH20] 5 cmH20  Intake/Output Summary (Last 24 hours) at 08/22/2022 0710 Last data filed at 08/22/2022 0700 Gross per 24 hour  Intake 2470.75 ml  Output 3205 ml  Net -734.25 ml   Filed Weights   08/06/22 1541 08/09/22 0931  Weight: 65.3 kg 67.6 kg     Physical exam: General: Crtitically ill-appearing female, orally intubated HEENT: [place]/AT, eyes  anicteric.  ETT and OGT in place Neuro: Opens eyes with vocal stimuli, PERRL wiggled right toes to command, generalized weakness Chest: Coarse breath sounds, no wheezes or rhonchi Heart: Regular rate and rhythm, no murmurs or gallops Abdomen: Soft, nondistended, bowel sounds present Skin: Would vac present on back, draining well.  No rashes  Labs and images were reviewed  Assessment & Plan:  Sepsis due to meningitis / ventriculitis  Acute septic encephalopathy MRI brain concerning for meningitis/ ventriculitis LP contraindicated given risk for lumbar surgical site infection due to multiple recent surgeries Continue broad-spectrum antibiotics per discussion with infectious disease, she is on cefepime, metronidazole and vancomycin   Acute hypoxic respiratory failure Aspiration pneumonia CTA chest concerning for multifocal pneumonia, suspect aspiration PNA Continue daily SBT, not able to extubate today due to mental status and increased work of breathing She is on broad-spectrum antibiotics Respiratory culture is growing yeast, likely colonization I do not think she needs to be treated for that VAP prevention bundle in place Propofol and PRN fentanyl for sedation Add duonebs q6hrs on 2/4, will consider hypertonic nebs for secretion clearance Repeat CXR to monitor Rt pleural effusion  Complex Lumbar wound (exposed hardware s/p removal of L L1-L5 screws, rod, R L1 screw on 1/22) No concern for abscess given MR and CT lumbar spine.  Negative pressure wound vac in place at surgical site. Appreciate neurosurgery follow-up   Hyponatremia/hypokalemia Continue aggressive electrolyte supplement and monitor Na slowly improving  Anemia of critical illness Monitoring H&H, transfuse if less than 7  T2DM  Patient hemoglobin A1c 6 Hold metformin Continue sliding scale insulin with CBG goal 140-180   Chronic HFrEF HTN CAD, non-obstructive  Repeat echocardiogram consistent with stable  ejection fraction of 40-45% Monitor intake and output Hold lasix for today Continue Coreg Hold Cozaar  Mild cog deficit   Bipolar disorder Continue donepezil, fluvoxumine and lamotrigine Hold gabapentin  Best Practice (right click and "Reselect all SmartList Selections" daily)   Diet/type: tubefeeds DVT prophylaxis: prophylactic heparin  GI prophylaxis: PPI Lines: N/A Foley:  Yes, and it is still needed Code Status:  full code Last date of multidisciplinary goals of care discussion: 2/3 patient's daughters and son was updated at bedside  This patient is critically ill with multiple organ system failure which requires frequent high complexity decision making, assessment, support, evaluation, and titration of therapies. This was completed through the application of advanced monitoring technologies and extensive interpretation of multiple databases.  During this encounter critical care time was devoted to patient care services described in this note for 45 minutes.    Freda Jackson, MD [facility] Office: [phone]   See Amion for personal pager [name] on call pager [phone] until 7pm. Please call Elink 7p-7a. [phone]

## 2022-08-22 NOTE — Progress Notes (Signed)
  Alma Friendly RN [facility]) came to reevaluated patient's wound vac. This RN and Alma Friendly confirmed that dressing was intact with an obstructed flow alarm. Some pressure was still applied, dressing clean and intact with no drainage. Agreed with plan to call MD if the dressing was no longer intact or began to leak.    Alma Friendly to follow-up early tomorrow morning.

## 2022-08-23 ENCOUNTER — Inpatient Hospital Stay (HOSPITAL_COMMUNITY): Payer: Medicare Other

## 2022-08-23 DIAGNOSIS — J690 Pneumonitis due to inhalation of food and vomit: Secondary | ICD-10-CM | POA: Diagnosis not present

## 2022-08-23 DIAGNOSIS — T847XXD Infection and inflammatory reaction due to other internal orthopedic prosthetic devices, implants and grafts, subsequent encounter: Secondary | ICD-10-CM | POA: Diagnosis not present

## 2022-08-23 DIAGNOSIS — G009 Bacterial meningitis, unspecified: Secondary | ICD-10-CM | POA: Diagnosis not present

## 2022-08-23 DIAGNOSIS — G9340 Encephalopathy, unspecified: Secondary | ICD-10-CM

## 2022-08-23 DIAGNOSIS — T8130XA Disruption of wound, unspecified, initial encounter: Secondary | ICD-10-CM | POA: Diagnosis not present

## 2022-08-23 DIAGNOSIS — J9601 Acute respiratory failure with hypoxia: Secondary | ICD-10-CM | POA: Diagnosis not present

## 2022-08-23 LAB — BASIC METABOLIC PANEL
Anion gap: 7 (ref 5–15)
BUN: 5 mg/dL — ABNORMAL LOW (ref 8–23)
CO2: 22 mmol/L (ref 22–32)
Calcium: 7 mg/dL — ABNORMAL LOW (ref 8.9–10.3)
Chloride: 101 mmol/L (ref 98–111)
Creatinine, Ser: <0.3 mg/dL — ABNORMAL LOW (ref 0.44–1.00)
Glucose, Bld: 121 mg/dL — ABNORMAL HIGH (ref 70–99)
Potassium: 3.6 mmol/L (ref 3.5–5.1)
Sodium: 130 mmol/L — ABNORMAL LOW (ref 135–145)

## 2022-08-23 LAB — CBC
HCT: 28.6 % — ABNORMAL LOW (ref 36.0–46.0)
Hemoglobin: 9.2 g/dL — ABNORMAL LOW (ref 12.0–15.0)
MCH: 31.5 pg (ref 26.0–34.0)
MCHC: 32.2 g/dL (ref 30.0–36.0)
MCV: 97.9 fL (ref 80.0–100.0)
Platelets: 681 10*3/uL — ABNORMAL HIGH (ref 150–400)
RBC: 2.92 MIL/uL — ABNORMAL LOW (ref 3.87–5.11)
RDW: 19.2 % — ABNORMAL HIGH (ref 11.5–15.5)
WBC: 13.6 10*3/uL — ABNORMAL HIGH (ref 4.0–10.5)
nRBC: 0 % (ref 0.0–0.2)

## 2022-08-23 LAB — POCT I-STAT 7, (LYTES, BLD GAS, ICA,H+H)
Acid-base deficit: 1 mmol/L (ref 0.0–2.0)
Bicarbonate: 23.1 mmol/L (ref 20.0–28.0)
Calcium, Ion: 1.16 mmol/L (ref 1.15–1.40)
HCT: 32 % — ABNORMAL LOW (ref 36.0–46.0)
Hemoglobin: 10.9 g/dL — ABNORMAL LOW (ref 12.0–15.0)
O2 Saturation: 100 %
Patient temperature: 97.6
Potassium: 3.4 mmol/L — ABNORMAL LOW (ref 3.5–5.1)
Sodium: 133 mmol/L — ABNORMAL LOW (ref 135–145)
TCO2: 24 mmol/L (ref 22–32)
pCO2 arterial: 36.3 mmHg (ref 32–48)
pH, Arterial: 7.409 (ref 7.35–7.45)
pO2, Arterial: 468 mmHg — ABNORMAL HIGH (ref 83–108)

## 2022-08-23 LAB — TRIGLYCERIDES: Triglycerides: 164 mg/dL — ABNORMAL HIGH (ref <150)

## 2022-08-23 LAB — GLUCOSE, CAPILLARY
Glucose-Capillary: 149 mg/dL — ABNORMAL HIGH (ref 70–99)
Glucose-Capillary: 179 mg/dL — ABNORMAL HIGH (ref 70–99)
Glucose-Capillary: 208 mg/dL — ABNORMAL HIGH (ref 70–99)
Glucose-Capillary: 212 mg/dL — ABNORMAL HIGH (ref 70–99)
Glucose-Capillary: 156 mg/dL — ABNORMAL HIGH (ref 70–99)
Glucose-Capillary: 136 mg/dL — ABNORMAL HIGH (ref 70–99)

## 2022-08-23 LAB — VANCOMYCIN, TROUGH: Vancomycin Tr: 7 ug/mL — ABNORMAL LOW (ref 15–20)

## 2022-08-23 LAB — PHOSPHORUS: Phosphorus: 2 mg/dL — ABNORMAL LOW (ref 2.5–4.6)

## 2022-08-23 LAB — MAGNESIUM: Magnesium: 1.9 mg/dL (ref 1.7–2.4)

## 2022-08-23 MED ORDER — FENTANYL 2500MCG IN NS 250ML (10MCG/ML) PREMIX INFUSION
INTRAVENOUS | Status: AC
  Administered 2022-08-23: 50 ug/h via INTRAVENOUS
  Filled 2022-08-23: qty 250

## 2022-08-23 MED ORDER — DOCUSATE SODIUM 50 MG/5ML PO LIQD: 100.0000 mg | Freq: Two times a day (BID) | ORAL | Status: DC

## 2022-08-23 MED ORDER — MIDAZOLAM HCL 2 MG/2ML IJ SOLN
INTRAMUSCULAR | Status: AC
  Filled 2022-08-23: qty 2

## 2022-08-23 MED ORDER — FENTANYL 2500MCG IN NS 250ML (10MCG/ML) PREMIX INFUSION
25.0000 ug/h | INTRAVENOUS | Status: DC
  Administered 2022-08-25: 50 ug/h via INTRAVENOUS
  Filled 2022-08-23: qty 250

## 2022-08-23 MED ORDER — ROCURONIUM BROMIDE 10 MG/ML (PF) SYRINGE
PREFILLED_SYRINGE | INTRAVENOUS | Status: AC
  Administered 2022-08-23: 70 mg via INTRAVENOUS
  Filled 2022-08-23: qty 10

## 2022-08-23 MED ORDER — FENTANYL BOLUS VIA INFUSION: 25.0000 ug | INTRAVENOUS | Status: DC | PRN

## 2022-08-23 MED ORDER — MIDAZOLAM HCL 2 MG/2ML IJ SOLN: 2.0000 mg | Freq: Once | INTRAMUSCULAR | Status: DC

## 2022-08-23 MED ORDER — ETOMIDATE 2 MG/ML IV SOLN
INTRAVENOUS | Status: AC
  Administered 2022-08-23: 20 mg via INTRAVENOUS
  Filled 2022-08-23: qty 20

## 2022-08-23 MED ORDER — INSULIN ASPART 100 UNIT/ML IJ SOLN
0.0000 [IU] | INTRAMUSCULAR | Status: DC
  Administered 2022-08-23: 2 [IU] via SUBCUTANEOUS
  Administered 2022-08-23: 5 [IU] via SUBCUTANEOUS
  Administered 2022-08-23 – 2022-08-24 (×3): 3 [IU] via SUBCUTANEOUS
  Administered 2022-08-24 (×2): 2 [IU] via SUBCUTANEOUS
  Administered 2022-08-24 – 2022-08-25 (×2): 3 [IU] via SUBCUTANEOUS
  Administered 2022-08-25: 2 [IU] via SUBCUTANEOUS
  Administered 2022-08-25: 3 [IU] via SUBCUTANEOUS
  Administered 2022-08-25 – 2022-08-28 (×12): 2 [IU] via SUBCUTANEOUS
  Administered 2022-08-28: 3 [IU] via SUBCUTANEOUS
  Administered 2022-08-28 – 2022-08-29 (×4): 2 [IU] via SUBCUTANEOUS
  Administered 2022-08-29 – 2022-08-30 (×3): 3 [IU] via SUBCUTANEOUS
  Administered 2022-08-30 (×3): 2 [IU] via SUBCUTANEOUS
  Administered 2022-08-30 – 2022-08-31 (×2): 3 [IU] via SUBCUTANEOUS
  Administered 2022-08-31 – 2022-09-01 (×3): 2 [IU] via SUBCUTANEOUS
  Administered 2022-09-01: 3 [IU] via SUBCUTANEOUS
  Administered 2022-09-01 – 2022-09-03 (×5): 2 [IU] via SUBCUTANEOUS

## 2022-08-23 MED ORDER — FENTANYL CITRATE PF 50 MCG/ML IJ SOSY
PREFILLED_SYRINGE | INTRAMUSCULAR | Status: AC
  Filled 2022-08-23: qty 2

## 2022-08-23 MED ORDER — POLYETHYLENE GLYCOL 3350 17 G PO PACK: 17.0000 g | PACK | Freq: Every day | ORAL | Status: DC

## 2022-08-23 MED ORDER — FENTANYL CITRATE PF 50 MCG/ML IJ SOSY: 25.0000 ug | PREFILLED_SYRINGE | Freq: Once | INTRAMUSCULAR | Status: DC

## 2022-08-23 MED ORDER — ROCURONIUM BROMIDE 50 MG/5ML IV SOLN
70.0000 mg | Freq: Once | INTRAVENOUS | Status: AC
  Filled 2022-08-23: qty 7

## 2022-08-23 MED ORDER — GLUCERNA 1.5 CAL PO LIQD
1000.0000 mL | ORAL | Status: DC
  Administered 2022-08-23 – 2022-09-01 (×7): 1000 mL
  Filled 2022-08-23 (×14): qty 1000

## 2022-08-23 MED ORDER — DEXMEDETOMIDINE HCL IN NACL 400 MCG/100ML IV SOLN
INTRAVENOUS | Status: AC
  Administered 2022-08-23: 0.4 ug/kg/h via INTRAVENOUS
  Filled 2022-08-23: qty 100

## 2022-08-23 MED ORDER — FENTANYL CITRATE PF 50 MCG/ML IJ SOSY: 100.0000 ug | PREFILLED_SYRINGE | Freq: Once | INTRAMUSCULAR | Status: DC

## 2022-08-23 MED ORDER — POTASSIUM PHOSPHATES 15 MMOLE/5ML IV SOLN
30.0000 mmol | Freq: Once | INTRAVENOUS | Status: AC
  Administered 2022-08-23: 30 mmol via INTRAVENOUS
  Filled 2022-08-23: qty 10

## 2022-08-23 MED ORDER — VANCOMYCIN HCL IN DEXTROSE 1-5 GM/200ML-% IV SOLN
1000.0000 mg | Freq: Three times a day (TID) | INTRAVENOUS | Status: AC
  Administered 2022-08-23 – 2022-08-30 (×23): 1000 mg via INTRAVENOUS
  Filled 2022-08-23 (×23): qty 200

## 2022-08-23 MED ORDER — ETOMIDATE 2 MG/ML IV SOLN: 20.0000 mg | Freq: Once | INTRAVENOUS | Status: AC

## 2022-08-23 MED ORDER — LABETALOL HCL 5 MG/ML IV SOLN
10.0000 mg | INTRAVENOUS | Status: DC | PRN
  Administered 2022-08-23 – 2022-08-30 (×3): 10 mg via INTRAVENOUS
  Filled 2022-08-23 (×2): qty 4

## 2022-08-23 MED ORDER — FUROSEMIDE 10 MG/ML IJ SOLN
40.0000 mg | Freq: Once | INTRAMUSCULAR | Status: AC
  Administered 2022-08-23: 40 mg via INTRAVENOUS
  Filled 2022-08-23: qty 4

## 2022-08-23 MED ORDER — SUCCINYLCHOLINE CHLORIDE 200 MG/10ML IV SOSY
PREFILLED_SYRINGE | INTRAVENOUS | Status: AC
  Filled 2022-08-23: qty 10

## 2022-08-23 MED ORDER — DEXMEDETOMIDINE HCL IN NACL 400 MCG/100ML IV SOLN: 0.0000 ug/kg/h | INTRAVENOUS | Status: DC

## 2022-08-23 MED ORDER — CARVEDILOL 12.5 MG PO TABS
12.5000 mg | ORAL_TABLET | Freq: Two times a day (BID) | ORAL | Status: DC
  Administered 2022-08-23 – 2022-09-03 (×21): 12.5 mg
  Filled 2022-08-23 (×22): qty 1

## 2022-08-23 NOTE — Progress Notes (Signed)
Nutrition Follow-up  DOCUMENTATION CODES:   Non-severe (moderate) malnutrition in context of acute illness/injury  INTERVENTION:   D/C Osmolite 1.5  Tube feeding via Cortrak tube: Glucerna 1.5 at 50 ml/h (1200 ml per day)  Provides 1800 kcal, 99 gm protein, 912 ml free water daily  Continue thiamine, MVI with minerals  NUTRITION DIAGNOSIS:   Moderate Malnutrition related to acute illness as evidenced by energy intake < 75% for > 7 days, mild fat depletion, mild muscle depletion. Ongoing, being addressed by nutrition support  GOAL:   Patient will meet greater than or equal to 90% of their needs Met with TF at goal  MONITOR:   TF tolerance, Weight trends, Vent status, Labs  REASON FOR ASSESSMENT:   Consult Enteral/tube feeding initiation and management  ASSESSMENT:   Pt with hx of GERD, HLD, HTN, and DM type 2 presented to ED for planned revision of lumbar surgery after previous was not healing and had exposed hardware.  Pt discussed during ICU rounds and with RN.  Will trial higher fiber formula for elevated blood sugars and loose stool now that pt is off vent. More likely these are the result of infection and antibiotics  Per MD pt with low grade fever concerning for infected lumbar wound, neurosurgery following.  Per MD CTA shows bilateral multifocal PNA likely in setting of aspiration, repeat xray shows improvement, extubated this afternoon.    02/01 - tx ICU, intubated 02/02 - Cortrak placed; per xray tip in mid distal stomach  02/05 - extubated   Medications reviewed and include: colace, pepcid, SSI, synthroid, MVI with minerals, miralax, thiamine  Kphos x 1  Vancomycin every 8 hours  Labs reviewed:  A1C 6 CBG's: 123-208  VAC: 25 ml   Diet Order:   Diet Order             Diet NPO time specified  Diet effective now                   EDUCATION NEEDS:   Not appropriate for education at this time  Skin:  Skin Assessment: Reviewed RN  Assessment (Stage 2 to the sacrum)  Last BM:  500 ml via rectal tube  Height:   Ht Readings from Last 1 Encounters:  08/09/22 5' (1.524 m)    Weight:   Wt Readings from Last 1 Encounters:  08/09/22 67.6 kg    Ideal Body Weight:  45.5 kg  BMI:  Body mass index is 29.1 kg/m.  Estimated Nutritional Needs:   Kcal:  1800-2000  Protein:  90-110 grams  Fluid:  1.8L/d  Narcissus Detwiler P., RD, LDN, CNSC See AMiON for contact information

## 2022-08-23 NOTE — Procedures (Signed)
Extubation Procedure Note  Patient Details:   Name: [name] DOB: 02/20/1956 MRN: [id number]   Airway Documentation:    Vent end date: 08/23/22 Vent end time: 1205   Evaluation  O2 sats: stable throughout Complications: No apparent complications Patient did tolerate procedure well. Bilateral Breath Sounds: Diminished, Clear   No  Patient extubated per order to 4L [place] with no apparent complications. Positive cuff leak was noted prior to extubation. Patient is alert but has not spoken at this time. Vitals are stable. RT will continue to monitor.   Aniyah Nobis Clyda Greener 08/23/2022, 12:19 PM

## 2022-08-23 NOTE — Progress Notes (Addendum)
Neurosurgery Service Progress Note  Subjective: NAE ON, Prevena was losing some suction yesterday, replaced by wound care this morning with a wound vac  Objective: Vitals:   08/23/22 0700 08/23/22 0725 08/23/22 0800 08/23/22 0900  BP: (!) 145/89 (!) 145/89 (!) 141/89 124/83  Pulse: (!) 106 (!) 109 (!) 111 94  Resp: (!) 28 (!) 28 (!) 29 (!) 21  Temp:   99.8 F (37.7 C)   TempSrc:   Axillary   SpO2: 97% 96% 94% 97%  Weight:      Height:        Physical Exam: Somnolent, sedated, MAEx4, gaze conjugate, not FC, incision improving - dehiscence with some serosang, minimal drainage  Assessment & Plan: 67 y.o. female s/p revision of complex lumbar wound, removal of left L1-L5 lumbar screws and rod, removal of right L1 screw, revision of L1-L5 posterior instrumented fusion. Complicated post-operative course   -no change in neurosurgical plan of care, updated family at bedside, CCM and ID recs   Judith Part, MD

## 2022-08-23 NOTE — Procedures (Signed)
Intubation Procedure Note  [name]  [id number]  10-06-1955  Date:08/23/22  Time:6:42 PM   Provider Performing:Jamil Armwood    Procedure: Intubation (31500)  Indication(s) Respiratory Failure  Consent Risks of the procedure as well as the alternatives and risks of each were explained to the patient and/or caregiver.  Consent for the procedure was obtained and is signed in the bedside chart   Anesthesia Etomidate and Rocuronium   Time Out Verified patient identification, verified procedure, site/side was marked, verified correct patient position, special equipment/implants available, medications/allergies/relevant history reviewed, required imaging and test results available.   Sterile Technique Usual hand hygeine, masks, and gloves were used   Procedure Description Patient positioned in bed supine.  Sedation given as noted above.  Patient was intubated with endotracheal tube using  MAC 4 .  View was Grade 1 full glottis .  Number of attempts was 1.  Colorimetric CO2 detector was consistent with tracheal placement.   Complications/Tolerance None; patient tolerated the procedure well. Chest X-ray is ordered to verify placement.   EBL Minimal   Specimen(s) None

## 2022-08-23 NOTE — Progress Notes (Signed)
Patient was extubated around 12 PM, initially she was doing well with O2 sat in the high 90s with respiratory rate in 20s, for next few hours patient respiratory rate started increasing, she became tachycardic  She has very weak cough, she started gurgling despite multiple time suction, she started struggling, became diaphoretic.  Due to increased work of breathing it was noted that patient needed reintubation, after discussion with family, patient was reintubated  Tracheostomy discussion was brought up considering she is a generalized weak with weak cough, patient's family would like to discuss amongst each other until tomorrow.   Additional critical care time spent 38 minutes    Jacky Kindle, MD [facility] See Amion for pager If no response to pager, please call [phone] until 7pm After 7pm, Please call E-link [phone]

## 2022-08-23 NOTE — Progress Notes (Addendum)
NAME:  [name], MRN:  [id number], DOB:  [birth year], LOS: 26 ADMISSION DATE:  08/09/2022, CONSULTATION DATE:  08/19/2022 REFERRING MD:  Darrick Meigs [facility]), CHIEF COMPLAINT:  Dyspnea, encephalopathy  History of Present Illness:  67 yo F PMH mild cognitive decline, bipolar disorder, recurrent UTIs, scoliosis with prior lumbar fusion who was admitted 1/22 for complex lumbar wound/visible lumbar spine hardware.   Has been admitted twice recently prior to current admission -- first for eval of back pain found to be unstable fracture at prior lumbar fusion site requiring PLIF L1-L5 on 12/9. Discharged to [facility] 12/21.  At SNF, wound dehisced and pt presented back to ED same day and was readmitted 12/21-12/29 (second admission), then dc back to SNF.  Her complex lumbar wound further deteriorated at SNF and she presented back to ED 1/22 with exposed lumbar hardware and was admitted (current admission).  She went to OR 1/22 for revision of L1-L5 fusion, removal of left L1-L5 screws and rod, and removal of right L1 screw, revision of L1-L5 posterior instrumented fusion.  She has been on doxy starting 1/24.  Current admission course has been complicated by encephalopathy which initially seemed 2/2 polypharmacy, then thought possible UTI, and Cefadroxil was added 1/26.  CNS depressing medications were also de-escalated.  Despite these measures, Pt become increasingly encephalopathic.  TRH was consulted 1/30 in this setting. She started spiking low grade temps 1/30 (100.7), and became febrile to 103 on 1/31, at which point abx were changed to vanc and zosyn.  Concomitantly, pt has had progressively worsening dyspnea and tachycardia.  CT lumbar spine did not show any surgical site infection.  [name] and ID were consulted on 2/1 in this setting.  MR brain concerning for ventriculitis or meningitis and continued on empiric treatment.  CTA chest concerning for PNA, continued on metronidazole for aspiration PNA.  Pertinent   Medical History  - Bipolar disorder - Mild cog impairment  - Recurrent UTI - Complex Lumbar spine Hx as noted above - T2DM - GERD  Significant Hospital Events: Including procedures, antibiotic start and stop dates in addition to other pertinent events  12/9 - PLIF for unstable L fracture through prior L spine fusion. Admitted 12/9-12/21, dc to SNF 12/21 12/21 - Readmitted 12/21-12/29 for wound dehiscence and dc back to SNF 1/22 - Readmitted for exposed L hardware; OR for removal of left L1-L5 screws, rod and right L1 screw. 1/22 - Started doxycycline. 1/23 - Encephalopathic 1/25 - Started ciprofloxacin 1/26 - Stopped ciprofloxacin, started cefadroxil for possible UTI  1/30 - New leukocytosis, ongoing confusion, fever; TRH consult 1/31 - Worse fever and confusion; abx change to vanc and zosyn 2/1 - Dyspnea, tachycardia. WBC 17.5.  [name] and ID consults. Abx changed to vanc, cefepime, and metronidazole. Admitted to ICU.  Hypoxemic, intubated.  CTA chest concerning for aspiration PNA. 2/2 - MR brain concerning for meningitis, continue empiric treatment w/ vanc and cefepime  Interim History / Subjective:  Patient continues to spike low-grade fever with Tmax 100.7 Wound VAC is stopped working overnight Tolerating spontaneous breathing trial this morning but little tachypneic  Objective   Blood pressure (!) 145/89, pulse (!) 109, temperature 99.8 F (37.7 C), temperature source Axillary, resp. rate (!) 28, height 5' (1.524 m), weight 67.6 kg, SpO2 96 %.    Vent Mode: PSV;CPAP FiO2 (%):  [30 %] 30 % Set Rate:  [20 bmp] 20 bmp Vt Set:  [360 mL] 360 mL PEEP:  [5 cmH20] 5 cmH20 Pressure Support:  [  10 cmH20] 10 cmH20   Intake/Output Summary (Last 24 hours) at 08/23/2022 2229 Last data filed at 08/23/2022 0700 Gross per 24 hour  Intake 2746.78 ml  Output 1855 ml  Net 891.78 ml   Filed Weights   08/06/22 1541 08/09/22 0931  Weight: 65.3 kg 67.6 kg     Physical exam: General:  Crtitically ill-appearing female, orally intubated HEENT: [place]/AT, eyes anicteric.  ETT and OGT in place Neuro: Eyes open, intermittently following commands, gaze is central, generalized weak Chest: Tachypneic, coarse breath sounds, no wheezes or rhonchi Heart: Tachycardic, regular rhythm, no murmurs or gallops Abdomen: Soft, nondistended, bowel sounds present Skin: No rash.  Wound VAC is placed on VAC, currently not draining   Labs and images were reviewed  Assessment & Plan:  Sepsis due to meningitis / ventriculitis  Acute septic encephalopathy MRI brain concerning for meningitis/ ventriculitis LP contraindicated given risk for lumbar surgical site infection due to multiple recent surgeries Patient continues to spike low-grade fever, questions arise if she has infected lumbar wound, defer to neurosurgery Continue broad-spectrum antibiotics per discussion with infectious disease, she is on ceftriaxone, metronidazole and vancomycin   Acute hypoxic respiratory failure Aspiration pneumonia CTA chest showing bilateral multifocal pneumonia likely in the setting of aspiration Repeat x-ray chest yesterday showed improvement in bilateral infiltrates Will perform bedside ultrasound of the left chest to make sure no pleural effusion Patient is tolerating spontaneous breathing trial but she is tachypneic Will try to give her chance of coming off of vent  She is on broad-spectrum antibiotics Respiratory culture is growing yeast, likely colonization I do not think she needs to be treated for that VAP prevention bundle in place Continue propofol and as needed fentanyl per PAD protocol with RASS goal -1 Continue nebs  Complex Lumbar wound (exposed hardware s/p removal of L L1-L5 screws, rod, R L1 screw on 1/22) No concern for abscess given MR and CT lumbar spine Wound VAC is being replaced now by wound care nurse as it malfunctioned last night Appreciate neurosurgery follow-up Wound care is  following   Hyponatremia/hypokalemia/hypophosphatemia Continue aggressive electrolyte supplement and monitor  Anemia of critical illness Monitoring H&H, transfuse if less than 7  T2DM  Patient hemoglobin A1c 6 Hold metformin Continue sliding scale insulin with CBG goal 140-180   Chronic HFrEF HTN CAD, non-obstructive  Repeat echocardiogram consistent with stable ejection fraction of 40-45% Monitor intake and output Will give her 1 dose of Lasix prior to extubation Continue Coreg Hold Cozaar  Mild cog deficit   Bipolar disorder Continue donepezil, fluvoxumine and lamotrigine Hold gabapentin  Best Practice (right click and "Reselect all SmartList Selections" daily)   Diet/type: tubefeeds DVT prophylaxis: prophylactic heparin  GI prophylaxis: PPI Lines: N/A Foley:  Yes, and it is still needed Code Status:  full code Last date of multidisciplinary goals of care discussion: 2/5 patient's husband, daughters and son was updated at bedside  This patient is critically ill with multiple organ system failure which requires frequent high complexity decision making, assessment, support, evaluation, and titration of therapies. This was completed through the application of advanced monitoring technologies and extensive interpretation of multiple databases.  During this encounter critical care time was devoted to patient care services described in this note for 41 minutes.    Jacky Kindle, MD [facility] See Amion for pager If no response to pager, please call [phone] until 7pm After 7pm, Please call E-link [phone]

## 2022-08-23 NOTE — Progress Notes (Signed)
PT Cancellation Note  Patient Details Name: NEVAH DALAL MRN: [id number] DOB: [birth year]   Cancelled Treatment:    Reason Eval/Treat Not Completed: Other (comment). Pt in the process of being extubated. PT to return as able/as appropriate to progress mobility.  Kittie Plater, PT, DPT Acute Rehabilitation Services Secure chat preferred Office #: [phone]    Berline Lopes 08/23/2022, 12:15 PM

## 2022-08-23 NOTE — Progress Notes (Signed)
Pharmacy Antibiotic Note  [name] is a 67 y.o. 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 Pharmacy has been consulted for Vancomycin + Cefepime dosing.   A Vancomycin trough this morning resulted as SUBtherapeutic at 7 mcg/ml (goal of 15-20 mcg/ml to target CNS concentrations). SCr stable at <0.3, UOP >0.5 ml/kg/hg.   Plan: - Adjust Vancomycin to 1g IV every 8 hours - Will plan to recheck a trough at new steady state - Continue Cefepime 2g IV every 8 hours and Flagyl 500 mg IV every 12 hours - Will continue to follow renal function, culture results, LOT, and antibiotic de-escalation plans   Height: 5' (152.4 cm) Weight:  (bed scale not working) IBW/kg (Calculated) : 45.5  Temp (24hrs), Avg:99.1 F (37.3 C), Min:97.8 F (36.6 C), Max:100.7 F (38.2 C)  Recent Labs  Lab 08/18/22 2024 08/19/22 0354 08/19/22 1051 08/19/22 1503 08/19/22 1847 08/19/22 2044 08/20/22 0520 08/21/22 0500 08/22/22 0625 08/23/22 0535 08/23/22 0857  WBC  --  6.2 17.5*  --   --   --  13.2*  --  12.3* 13.6*  --   CREATININE  --  1.46* 0.44  --   --   --  0.44 <0.30* <0.30* <0.30*  --   LATICACIDVEN 2.5*  --  2.6* 3.4* 2.8* 2.0*  --   --   --   --   --   VANCOTROUGH  --   --   --   --   --   --   --   --   --   --  7*    CrCl cannot be calculated (This lab value cannot be used to calculate CrCl because it is not a number: <0.30).    No Known Allergies  Antimicrobials this admission: Cipro 1/25 >>1/26  Doxy 1/23 >>1/31 Cefadroxil 1/26 >>1/31 Vancomycin 1/31>> (increased 2/2 and 2/5)  Zosyn 1/31>>2/1 Cefepime 2/1 >> Flagyl 2/1 >>  Dose adjustments this admission: 2/5 VT 7 on 750 mg/12h >> increase to 1g IV every 8 hours  Microbiology results: 1/28 CDiff >> neg 1/30 BCx >> NGf 2/2 RCx (TA) >> candida albicans  Thank you for allowing pharmacy to be a part of this patient's care.  Alycia Rossetti, PharmD, BCPS Infectious Diseases  Clinical Pharmacist 08/23/2022 3:13 PM   **Pharmacist phone directory can now be found on [email or web address] ([email or web address]).  Listed under [place].

## 2022-08-23 NOTE — Progress Notes (Signed)
OT Cancellation Note  Patient Details Name: [name] MRN: [id number] DOB: 1955/09/14   Cancelled Treatment:    Reason Eval/Treat Not Completed: Other (comment)- pt getting extubated, OT to hold at this time. Will follow and see as able.    [place] [facility] [place] 08/23/2022, 1:10 PM

## 2022-08-23 NOTE — Consult Note (Signed)
[facility] Nurse Consult Note: Reason for Consult:Consulted due to issues with Prevena NPWT.  Upon arrival to room, [name] is working, no alarms and seal appears intact.  Family indicates that foam looks saturated and they are concerned due to blockage alarm over the weekend and very little drainage noted.  I reach out to Dr Zada Finders who agrees that we can place a facility VAC ([facility])   Erythema to upper buttocks and sacrum  Gerhardts paste is applied after wound care.  Have fecal manager in place, some seepage around tubing .  Wound type:surgical, infectious Pressure Injury POA: NA Measurement: 13 cm suture line with 0.3 cm dehiscence noted along 80%  Wound NIO:EVOJJK fibrin Drainage (amount, consistency, odor) minimal serosanguinous Periwound: No fluctuance, warmth, induration.  Some redness from removal of the adhesive, but no lasting erythema noted  Photo in chart  Dressing procedure/placement/frequency: Cleanse wound with NS.  Barrier ring to perimeter of wound at distal end to protect from incontinence insult and along segment of suture line where TRAC pad is applied to avoid trauma to periwound. COver with drape.  Seal immediately achieved at 125 mmHg, ,Change Mon.Wed.Fri [facility] team will follow.   Marissa Ludwig MSN, RN, FNP-BC CWON Wound, Ostomy, Continence Nurse [facility] [phone] Pager [phone]

## 2022-08-23 NOTE — Progress Notes (Signed)
[facility] for Infectious Disease  Date of Admission:  08/09/2022     Total days of antibiotics 14         ASSESSMENT:  [name] continues to have waxing and waning temperatures. Plan for trial extubation today as she has been weaning okay. Blood cultures have finalized without growth.  Concern remains for CNS infection and will continue with broad spectrum coverage with vancomycin, cefepime and metronidazole.  Continue therapeutic drug monitoring of vancomycin levels and renal function. Discussed plan of care with family the need for prolonged course of antibiotics given suspected lumbar infection that disseminated to CNS and potential risk for abscess. Remaining medical and supportive care per CCM and Neurosurgery.  PLAN:  Continue broad spectrum coverage with vancomycin, ceftriaxone and metronidazole.  Therapeutic drug monitoring of vancomycin levels and renal function.  Post-operative wound care per Neurosurgery. Remaining medical and supportive care per [name].   Principal Problem:   Wound dehiscence Active Problems:   Encephalopathy acute   Aspiration pneumonia of both lungs (HCC)   Acute respiratory failure with hypoxia (HCC)   Sepsis (HCC)   Seizure (HCC)   Malnutrition of moderate degree   Bacterial meningitis   Hardware complicating wound infection ([place])   Infection of deep incisional surgical site after procedure    carvedilol  12.5 mg Per Tube BID WC   Chlorhexidine Gluconate Cloth  6 each Topical Daily   docusate  100 mg Per Tube BID   donepezil  10 mg Per Tube Daily   famotidine  20 mg Per Tube BID   feeding supplement (PROSource TF20)  60 mL Per Tube Daily   [name]'s butt cream   Topical Daily   heparin  5,000 Units Subcutaneous Q8H   insulin aspart  0-15 Units Subcutaneous Q4H   ipratropium-albuterol  3 mL Nebulization Q6H   lamoTRIgine  200 mg Per Tube Daily   levothyroxine  88 mcg Per Tube QAC breakfast   multivitamin with minerals  1 tablet  Per Tube Daily   mouth rinse  15 mL Mouth Rinse Q2H   polyethylene glycol  17 g Per Tube Daily   sodium chloride flush  10-40 mL Intracatheter Q12H   sodium chloride flush  3 mL Intravenous Q12H   sodium chloride  1 g Per Tube BID WC   thiamine  100 mg Per Tube Daily    SUBJECTIVE:  Max temperature of 100.7 F overnight with no acute events. Family at bedside. [name] remains intubated.   No Known Allergies   Review of Systems: Review of Systems  Unable to perform ROS: Intubated      OBJECTIVE: Vitals:   08/23/22 1000 08/23/22 1100 08/23/22 1200 08/23/22 1205  BP: (!) 155/87 (!) 143/93 (!) 151/100   Pulse: (!) 105   (!) 109  Resp: (!) 34 (!) 35 (!) 31 20  Temp:      TempSrc:      SpO2: 98%   100%  Weight:      Height:       Body mass index is 29.1 kg/m.  Physical Exam Constitutional:      General: She is not in acute distress.    Appearance: She is well-developed.     Comments: Eyes open lying in bed with head of bed elevated; follows some commands.   Cardiovascular:     Rate and Rhythm: Normal rate and regular rhythm.     Heart sounds: Normal heart sounds.  Pulmonary:  Effort: Pulmonary effort is normal.     Breath sounds: Normal breath sounds.  Skin:    General: Skin is warm and dry.     Lab Results Lab Results  Component Value Date   WBC 13.6 (H) 08/23/2022   HGB 9.2 (L) 08/23/2022   HCT 28.6 (L) 08/23/2022   MCV 97.9 08/23/2022   PLT 681 (H) 08/23/2022    Lab Results  Component Value Date   CREATININE <0.30 (L) 08/23/2022   BUN 5 (L) 08/23/2022   NA 130 (L) 08/23/2022   K 3.6 08/23/2022   CL 101 08/23/2022   CO2 22 08/23/2022    Lab Results  Component Value Date   ALT 19 08/22/2022   AST 20 08/22/2022   ALKPHOS 67 08/22/2022   BILITOT 0.2 (L) 08/22/2022     Microbiology: Recent Results (from the past 240 hour(s))  C Difficile Quick Screen (NO PCR Reflex)     Status: None   Collection Time: 08/15/22  3:27 PM   Specimen:  STOOL  Result Value Ref Range Status   C Diff antigen NEGATIVE NEGATIVE Final   C Diff toxin NEGATIVE NEGATIVE Final   C Diff interpretation No C. difficile detected.  Final    Comment: Performed at [facility], [place] [address] [place], [place] [address]  Culture, blood (Routine X 2) w Reflex to ID Panel     Status: None   Collection Time: 08/17/22  6:59 PM   Specimen: BLOOD  Result Value Ref Range Status   Specimen Description BLOOD LEFT ANTECUBITAL  Final   Special Requests   Final    BOTTLES DRAWN AEROBIC AND ANAEROBIC Blood Culture results may not be optimal due to an inadequate volume of blood received in culture bottles   Culture   Final    NO GROWTH 5 DAYS Performed at [facility], [place] [address] [place], [place] [address]    Report Status 08/22/2022 FINAL  Final  Culture, blood (Routine X 2) w Reflex to ID Panel     Status: None   Collection Time: 08/17/22  7:01 PM   Specimen: BLOOD  Result Value Ref Range Status   Specimen Description BLOOD LEFT ANTECUBITAL  Final   Special Requests   Final    BOTTLES DRAWN AEROBIC AND ANAEROBIC Blood Culture results may not be optimal due to an inadequate volume of blood received in culture bottles   Culture   Final    NO GROWTH 5 DAYS Performed at [facility], [place] [address] [place], [place] [address]    Report Status 08/22/2022 FINAL  Final  Culture, Respiratory w Gram Stain     Status: None   Collection Time: 08/20/22 12:22 AM   Specimen: Tracheal Aspirate; Respiratory  Result Value Ref Range Status   Specimen Description TRACHEAL ASPIRATE  Final   Special Requests NONE  Final   Gram Stain   Final    FEW WBC PRESENT,BOTH PMN AND MONONUCLEAR RARE YEAST WITH PSEUDOHYPHAE Performed at [facility], [address], [place], [place] [address]    Culture FEW CANDIDA ALBICANS  Final   Report Status 08/22/2022 FINAL  Final     Terri Piedra, NP [facility] for Infectious Disease [facility]  08/23/2022  1:53 PM

## 2022-08-24 ENCOUNTER — Inpatient Hospital Stay (HOSPITAL_COMMUNITY): Payer: Medicare Other

## 2022-08-24 DIAGNOSIS — R569 Unspecified convulsions: Secondary | ICD-10-CM

## 2022-08-24 DIAGNOSIS — T847XXD Infection and inflammatory reaction due to other internal orthopedic prosthetic devices, implants and grafts, subsequent encounter: Secondary | ICD-10-CM | POA: Diagnosis not present

## 2022-08-24 DIAGNOSIS — T8130XA Disruption of wound, unspecified, initial encounter: Secondary | ICD-10-CM | POA: Diagnosis not present

## 2022-08-24 DIAGNOSIS — G9340 Encephalopathy, unspecified: Secondary | ICD-10-CM | POA: Diagnosis not present

## 2022-08-24 DIAGNOSIS — T8142XD Infection following a procedure, deep incisional surgical site, subsequent encounter: Secondary | ICD-10-CM

## 2022-08-24 DIAGNOSIS — J9601 Acute respiratory failure with hypoxia: Secondary | ICD-10-CM | POA: Diagnosis not present

## 2022-08-24 DIAGNOSIS — G009 Bacterial meningitis, unspecified: Secondary | ICD-10-CM | POA: Diagnosis not present

## 2022-08-24 DIAGNOSIS — J690 Pneumonitis due to inhalation of food and vomit: Secondary | ICD-10-CM | POA: Diagnosis not present

## 2022-08-24 LAB — CBC
HCT: 29.4 % — ABNORMAL LOW (ref 36.0–46.0)
Hemoglobin: 9.7 g/dL — ABNORMAL LOW (ref 12.0–15.0)
MCH: 32.2 pg (ref 26.0–34.0)
MCHC: 33 g/dL (ref 30.0–36.0)
MCV: 97.7 fL (ref 80.0–100.0)
Platelets: 723 10*3/uL — ABNORMAL HIGH (ref 150–400)
RBC: 3.01 MIL/uL — ABNORMAL LOW (ref 3.87–5.11)
RDW: 19.8 % — ABNORMAL HIGH (ref 11.5–15.5)
WBC: 20.1 10*3/uL — ABNORMAL HIGH (ref 4.0–10.5)
nRBC: 0 % (ref 0.0–0.2)

## 2022-08-24 LAB — MAGNESIUM: Magnesium: 2 mg/dL (ref 1.7–2.4)

## 2022-08-24 LAB — GLUCOSE, CAPILLARY
Glucose-Capillary: 154 mg/dL — ABNORMAL HIGH (ref 70–99)
Glucose-Capillary: 147 mg/dL — ABNORMAL HIGH (ref 70–99)
Glucose-Capillary: 136 mg/dL — ABNORMAL HIGH (ref 70–99)
Glucose-Capillary: 155 mg/dL — ABNORMAL HIGH (ref 70–99)
Glucose-Capillary: 167 mg/dL — ABNORMAL HIGH (ref 70–99)
Glucose-Capillary: 109 mg/dL — ABNORMAL HIGH (ref 70–99)

## 2022-08-24 LAB — BASIC METABOLIC PANEL
Anion gap: 9 (ref 5–15)
BUN: 11 mg/dL (ref 8–23)
CO2: 24 mmol/L (ref 22–32)
Calcium: 7.6 mg/dL — ABNORMAL LOW (ref 8.9–10.3)
Chloride: 99 mmol/L (ref 98–111)
Creatinine, Ser: 0.31 mg/dL — ABNORMAL LOW (ref 0.44–1.00)
GFR, Estimated: >60 mL/min (ref >60)
Glucose, Bld: 141 mg/dL — ABNORMAL HIGH (ref 70–99)
Potassium: 3.4 mmol/L — ABNORMAL LOW (ref 3.5–5.1)
Sodium: 132 mmol/L — ABNORMAL LOW (ref 135–145)

## 2022-08-24 LAB — VANCOMYCIN, TROUGH: Vancomycin Tr: 15 ug/mL (ref 15–20)

## 2022-08-24 LAB — PHOSPHORUS: Phosphorus: 2.7 mg/dL (ref 2.5–4.6)

## 2022-08-24 MED ORDER — NOREPINEPHRINE 4 MG/250ML-% IV SOLN: 0.0000 ug/min | INTRAVENOUS | Status: DC

## 2022-08-24 MED ORDER — ROCURONIUM BROMIDE 10 MG/ML (PF) SYRINGE
100.0000 mg | PREFILLED_SYRINGE | Freq: Once | INTRAVENOUS | Status: AC
  Administered 2022-08-24: 100 mg via INTRAVENOUS
  Filled 2022-08-24: qty 10

## 2022-08-24 MED ORDER — MIDAZOLAM HCL 2 MG/2ML IJ SOLN
5.0000 mg | Freq: Once | INTRAMUSCULAR | Status: AC
  Administered 2022-08-24: 2 mg via INTRAVENOUS
  Filled 2022-08-24: qty 6

## 2022-08-24 MED ORDER — METRONIDAZOLE 500 MG/100ML IV SOLN
500.0000 mg | Freq: Three times a day (TID) | INTRAVENOUS | Status: DC
  Administered 2022-08-24 – 2022-09-03 (×31): 500 mg via INTRAVENOUS
  Filled 2022-08-24 (×31): qty 100

## 2022-08-24 MED ORDER — POTASSIUM CHLORIDE 20 MEQ PO PACK
40.0000 meq | PACK | Freq: Once | ORAL | Status: AC
  Administered 2022-08-24: 40 meq
  Filled 2022-08-24: qty 2

## 2022-08-24 MED ORDER — ETOMIDATE 2 MG/ML IV SOLN
20.0000 mg | Freq: Once | INTRAVENOUS | Status: AC
  Administered 2022-08-24: 20 mg via INTRAVENOUS
  Filled 2022-08-24: qty 10

## 2022-08-24 MED ORDER — PHENYLEPHRINE 80 MCG/ML (10ML) SYRINGE FOR IV PUSH (FOR BLOOD PRESSURE SUPPORT)
PREFILLED_SYRINGE | INTRAVENOUS | Status: AC
  Administered 2022-08-24: 320 ug
  Administered 2022-08-24: 240 ug
  Filled 2022-08-24: qty 10

## 2022-08-24 MED ORDER — LIDOCAINE-EPINEPHRINE (PF) 2 %-1:200000 IJ SOLN
INTRAMUSCULAR | Status: AC
  Administered 2022-08-24: 20 mL
  Filled 2022-08-24: qty 20

## 2022-08-24 MED ORDER — FUROSEMIDE 10 MG/ML IJ SOLN
40.0000 mg | Freq: Once | INTRAMUSCULAR | Status: AC
  Administered 2022-08-24: 40 mg via INTRAVENOUS
  Filled 2022-08-24: qty 4

## 2022-08-24 MED ORDER — HEPARIN SODIUM (PORCINE) 5000 UNIT/ML IJ SOLN
5000.0000 [IU] | Freq: Three times a day (TID) | INTRAMUSCULAR | Status: DC
  Administered 2022-08-24 – 2022-09-03 (×30): 5000 [IU] via SUBCUTANEOUS
  Filled 2022-08-24 (×30): qty 1

## 2022-08-24 MED ORDER — LACTATED RINGERS IV BOLUS
500.0000 mL | Freq: Once | INTRAVENOUS | Status: AC
  Administered 2022-08-24: 500 mL via INTRAVENOUS

## 2022-08-24 MED ORDER — NOREPINEPHRINE 4 MG/250ML-% IV SOLN
INTRAVENOUS | Status: AC
  Administered 2022-08-24: 5 mg
  Filled 2022-08-24: qty 250

## 2022-08-24 MED ORDER — LIDOCAINE-EPINEPHRINE 1 %-1:100000 IJ SOLN
20.0000 mL | Freq: Once | INTRAMUSCULAR | Status: AC
  Filled 2022-08-24: qty 20

## 2022-08-24 MED ORDER — FENTANYL CITRATE PF 50 MCG/ML IJ SOSY
200.0000 ug | PREFILLED_SYRINGE | Freq: Once | INTRAMUSCULAR | Status: AC
  Administered 2022-08-24: 100 ug via INTRAVENOUS
  Filled 2022-08-24: qty 4

## 2022-08-24 NOTE — Progress Notes (Signed)
Pharmacy Antibiotic Note  [name] is a 67 y.o. 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 Pharmacy has been consulted for Vancomycin + Cefepime dosing.   A Vancomycin trough this morning resulted as therapeutic at 15 mcg/ml (goal of 15-20 mcg/ml to target CNS concentrations). SCr stable at 0.31, UOP >0.5 ml/kg/hg.   Plan: - Continue Vancomycin to 1g IV every 8 hours - Will plan to recheck a trough at new steady state - Continue Cefepime 2g IV every 8 hours and Flagyl 500 mg IV every 8 hours - Will continue to follow renal function, culture results, LOT, and antibiotic de-escalation plans   Height: 5' (152.4 cm) Weight:  (bed scale not working) IBW/kg (Calculated) : 45.5  Temp (24hrs), Avg:97.7 F (36.5 C), Min:97.1 F (36.2 C), Max:98.5 F (36.9 C)  Recent Labs  Lab 08/18/22 2024 08/19/22 0354 08/19/22 1051 08/19/22 1503 08/19/22 1847 08/19/22 2044 08/20/22 0520 08/21/22 0500 08/22/22 0625 08/23/22 0535 08/23/22 0857 08/24/22 0520  WBC  --    < > 17.5*  --   --   --  13.2*  --  12.3* 13.6*  --  20.1*  CREATININE  --    < > 0.44  --   --   --  0.44 <0.30* <0.30* <0.30*  --  0.31*  LATICACIDVEN 2.5*  --  2.6* 3.4* 2.8* 2.0*  --   --   --   --   --   --   VANCOTROUGH  --   --   --   --   --   --   --   --   --   --  7*  --    < > = values in this interval not displayed.     Estimated Creatinine Clearance: 58.5 mL/min (A) (by C-G formula based on SCr of 0.31 mg/dL (L)).    No Known Allergies  Antimicrobials this admission: Cipro 1/25 >>1/26  Doxy 1/23 >>1/31 Cefadroxil 1/26 >>1/31 Vancomycin 1/31>>  Zosyn 1/31>>2/1 Cefepime 2/1 >> Flagyl 2/1 >>  Dose adjustments this admission: 2/5 VT 7 on 750 mg/12h >> increase to 1g IV every 8 hours 2/6 VT 15 on 1g/8h >> continue current dose  Microbiology results: 1/28 CDiff >> neg 1/30 BCx >> NGf 2/2 RCx (TA) >> candida albicans 2/6 RCx (TA) >>  Thank you for  allowing pharmacy to be a part of this patient's care.  Alycia Rossetti, PharmD, BCPS Infectious Diseases Clinical Pharmacist 08/24/2022 8:26 AM   **Pharmacist phone directory can now be found on [email or web address] ([email or web address]).  Listed under [place] [facility].

## 2022-08-24 NOTE — Procedures (Signed)
Diagnostic Bronchoscopy  [name]  [id number]  May 12, 1956  Date:08/24/22  Time:2:49 PM   Provider Performing:Mariesa Grieder S [name]   Procedure: Diagnostic Bronchoscopy with BAL  Indication(s) Assist with direct visualization of tracheostomy placement  Consent Risks of the procedure as well as the alternatives and risks of each were explained to the patient and/or caregiver.  Consent for the procedure was obtained.   Anesthesia See separate tracheostomy note   Time Out Verified patient identification, verified procedure, site/side was marked, verified correct patient position, special equipment/implants available, medications/allergies/relevant history reviewed, required imaging and test results available.   Sterile Technique Usual hand hygiene, masks, gowns, and gloves were used   Procedure Description Bronchoscope advanced through endotracheal tube and into airway.  After suctioning out tracheal secretions, bronchoscope used to provide direct visualization of tracheostomy placement.  Good visualization of needle placement into the anterior trachea.  No significant bleeding.  No impact on the posterior wall.  Serial dilations observed and no complications.  After the tracheostomy was sutured into place the bronchoscope was introduced through the new airway to visualize main carina and bilateral lobar airways.  No significant bleeding noted.  A BAL was performed in the superior segment of the right lower lobe to be sent for culture with 30 cc normal saline instilled and approximately 15 cc returned.   Complications/Tolerance None; patient tolerated the procedure well.   EBL None  Specimen(s) Right lower lobe BAL for bacterial culture  Baltazar Apo, MD, PhD 08/24/2022, 2:51 PM [facility] [phone] or if no answer before 7:00PM call [phone] For any issues after 7:00PM please call eLink [phone]

## 2022-08-24 NOTE — Procedures (Signed)
Percutaneous Tracheostomy Procedure Note   [name]  [id number]  July 26, 1955  Date:08/24/22  Time:2:54 PM   Provider Performing:Lux Meaders  Procedure: Percutaneous Tracheostomy with Bronchoscopic Guidance (31600)  Indication(s) Acute respiratory failure  Consent Risks of the procedure as well as the alternatives and risks of each were explained to the patient and/or caregiver.  Consent for the procedure was obtained.  Anesthesia Etomidate, Versed, Fentanyl, Vecuronium   Time Out Verified patient identification, verified procedure, site/side was marked, verified correct patient position, special equipment/implants available, medications/allergies/relevant history reviewed, required imaging and test results available.   Sterile Technique Maximal sterile technique including sterile barrier drape, hand hygiene, sterile gown, sterile gloves, mask, hair covering.    Procedure Description Appropriate anatomy identified by palpation.  Patient's neck prepped and draped in sterile fashion.  1% lidocaine with epinephrine was used to anesthetize skin overlying neck.  1.5cm incision made and blunt dissection performed until tracheal rings could be easily palpated.   Then a size 6 Shiley tracheostomy was placed under bronchoscopic visualization using usual Seldinger technique and serial dilation.   Bronchoscope confirmed placement above the carina.  Tracheostomy was sutured in place with adhesive pad to protect skin under pressure.    Patient connected to ventilator.   Complications/Tolerance None; patient tolerated the procedure well. Chest X-ray is ordered to confirm no post-procedural complication.   EBL Minimal   Specimen(s) None

## 2022-08-24 NOTE — Progress Notes (Signed)
Neurosurgery Service Progress Note  Subjective: Extubated yesterday, unfortunately reintubated due to fatigue and airway protection  Objective: Vitals:   08/24/22 0900 08/24/22 1000 08/24/22 1034 08/24/22 1100  BP: 111/76 128/79  130/82  Pulse: (!) 102 98 94 97  Resp: (!) 28 (!) 28 (!) 28 (!) 28  Temp:      TempSrc:      SpO2: 94% 98% 97% 94%  Weight:      Height:        Physical Exam: Somnolent, sedated, MAEx4, gaze conjugate, not FC, wound vac in place, good seal on pump  Assessment & Plan: 67 y.o. female s/p revision of complex lumbar wound, removal of left L1-L5 lumbar screws and rod, removal of right L1 screw, revision of L1-L5 posterior instrumented fusion. Complicated post-operative course   -no change in neurosurgical plan of care, trach later today   Judith Part, MD

## 2022-08-24 NOTE — Progress Notes (Signed)
Physical Therapy Treatment Patient Details Name: [name] MRN: [id number] DOB: 08/29/1955 Today's Date: 08/24/2022   History of Present Illness Patient is a 67 y/o female who presents on 1/22 for revision of complex lumbar wound, removal of left L1-L5 lumbar screws and rod, removal of right L1 screw and revision of L1-L5 posterior instrumented fusion. 1/30 pt febrile and more lethargic in AM, MD ordered CXR. Planned for trach on 2/6. PMH includes DM, HTN, anxiety, left TSA, arthritis. Current admission course has been complicated by encephalopathywhich initially seemed 2/2 polypharmacy, then thought possible UTI, and Cefadroxil was added 1/26.  CNS depressing medications were also de-escalated.  Despite these measures, Pt become increasingly encephalopathic.2/1: Admitted to ICU and intubated. 2/2: MR brain concerning for meningitis. 2/6: Pt was extubated although re-intubated due to fatigue and airway protection. 2/6: Plan for trach placement    PT Comments    Pt no longer on sedation. Pt with eyes open and tracking people and turning head/eyes to name. Pt continues with no command following and no voluntary movement. Pt placed in chair position and provide bilat LE ROM as pt has mobilized since 1/25. Plan is for trach placement at 14:00 today. Acute PT to cont to follow.    Recommendations for follow up therapy are one component of a multi-disciplinary discharge planning process, led by the attending physician.  Recommendations may be updated based on patient status, additional functional criteria and insurance authorization.  Follow Up Recommendations  Skilled nursing-short term rehab (<3 hours/day) Can patient physically be transported by private vehicle: No   Assistance Recommended at Discharge Frequent or constant Supervision/Assistance  Patient can return home with the following Two people to help with walking and/or transfers;A lot of help with bathing/dressing/bathroom;Assist for  transportation;Help with stairs or ramp for entrance;Direct supervision/assist for medications management;Assistance with cooking/housework   Equipment Recommendations  None recommended by PT    Recommendations for Other Services       Precautions / Restrictions Precautions Precautions: Back;Fall Precaution Booklet Issued: Yes (comment) Precaution Comments: vent, cor track Restrictions Weight Bearing Restrictions: No     Mobility  Bed Mobility               General bed mobility comments: placed in chair position in bed    Transfers                        Ambulation/Gait                   Stairs             Wheelchair Mobility    Modified Rankin (Stroke Patients Only)       Balance Overall balance assessment: Needs assistance     Sitting balance - Comments: egress position in bed                                    Cognition Arousal/Alertness: Awake/alert Behavior During Therapy: Flat affect Overall Cognitive Status: Impaired/Different from baseline                                 General Comments: pt turning head and eyes to name but no active command follow        Exercises Other Exercises Other Exercises: PROM to bilat LEs at hip, knee, and ankles, pt with  noted extremely tight heel cords    General Comments General comments (skin integrity, edema, etc.): VSS, HR 101-106bpm while in chair position, BP stable      Pertinent Vitals/Pain Pain Assessment Pain Assessment: Faces Faces Pain Scale: No hurt    Home Living Family/patient expects to be discharged to:: Skilled nursing facility                        Prior Function            PT Goals (current goals can now be found in the care plan section) Acute Rehab PT Goals PT Goal Formulation: With patient/family Time For Goal Achievement: 09/07/22 Potential to Achieve Goals: Fair Progress towards PT goals: Not progressing  toward goals - comment    Frequency    Min 3X/week      PT Plan Frequency needs to be updated    Co-evaluation PT/OT/SLP Co-Evaluation/Treatment: Yes Reason for Co-Treatment: Complexity of the patient's impairments (multi-system involvement) PT goals addressed during session: Strengthening/ROM OT goals addressed during session: Strengthening/ROM      AM-PAC PT "6 Clicks" Mobility   Outcome Measure  Help needed turning from your back to your side while in a flat bed without using bedrails?: Total Help needed moving from lying on your back to sitting on the side of a flat bed without using bedrails?: Total Help needed moving to and from a bed to a chair (including a wheelchair)?: Total Help needed standing up from a chair using your arms (e.g., wheelchair or bedside chair)?: Total Help needed to walk in hospital room?: Total Help needed climbing 3-5 steps with a railing? : Total 6 Click Score: 6    End of Session   Activity Tolerance: Patient tolerated treatment well Patient left: in bed;with call bell/phone within reach;with bed alarm set;with nursing/sitter in room;with family/visitor present;Other (comment) Nurse Communication: Mobility status PT Visit Diagnosis: Pain;Muscle weakness (generalized) (M62.81)     Time: 5465-6812 PT Time Calculation (min) (ACUTE ONLY): 39 min  Charges:  $Therapeutic Exercise: 8-22 mins                     Kittie Plater, PT, DPT Acute Rehabilitation Services Secure chat preferred Office #: [phone]    Berline Lopes 08/24/2022, 2:21 PM

## 2022-08-24 NOTE — Progress Notes (Signed)
[facility] ADULT ICU REPLACEMENT PROTOCOL   The patient does apply for the [facility] Adult ICU Electrolyte Replacment Protocol based on the criteria listed below:   1.Exclusion criteria: TCTS, ECMO, Dialysis, and Myasthenia Gravis patients 2. Is GFR >/= 30 ml/min? Yes.    Patient's GFR today is >60 3. Is SCr </= 2? Yes.   Patient's SCr is 0.31 mg/dL 4. Did SCr increase >/= 0.5 in 24 hours? No. 5.Pt's weight >40kg  Yes.   6. Abnormal electrolyte(s): potassium 3.4  7. Electrolytes replaced per protocol 8.  Call MD STAT for K+ </= 2.5, Phos </= 1, or Mag </= 1 Physician:  n/a  [name] 08/24/2022 6:24 AM

## 2022-08-24 NOTE — Progress Notes (Signed)
Orthopedic Tech Progress Note Patient Details:  [name] [birth year] [id number]  Patient ID: [name], female   DOB: [birth year], 67 y.o.   MRN: [id number]  Order called in to hanger.  Edwina Barth 08/24/2022, 7:56 PM

## 2022-08-24 NOTE — Progress Notes (Signed)
NAME:  [name], MRN:  [id number], DOB:  Dec 09, 1955, LOS: 69 ADMISSION DATE:  08/09/2022, CONSULTATION DATE:  08/19/2022 REFERRING MD:  Darrick Meigs [facility]), CHIEF COMPLAINT:  Dyspnea, encephalopathy  History of Present Illness:  67 yo F PMH mild cognitive decline, bipolar disorder, recurrent UTIs, scoliosis with prior lumbar fusion who was admitted 1/22 for complex lumbar wound/visible lumbar spine hardware.   Has been admitted twice recently prior to current admission -- first for eval of back pain found to be unstable fracture at prior lumbar fusion site requiring PLIF L1-L5 on 12/9. Discharged to [facility] 12/21.  At SNF, wound dehisced and pt presented back to ED same day and was readmitted 12/21-12/29 (second admission), then dc back to SNF.  Her complex lumbar wound further deteriorated at SNF and she presented back to ED 1/22 with exposed lumbar hardware and was admitted (current admission).  She went to OR 1/22 for revision of L1-L5 fusion, removal of left L1-L5 screws and rod, and removal of right L1 screw, revision of L1-L5 posterior instrumented fusion.  She has been on doxy starting 1/24.  Current admission course has been complicated by encephalopathy which initially seemed 2/2 polypharmacy, then thought possible UTI, and Cefadroxil was added 1/26.  CNS depressing medications were also de-escalated.  Despite these measures, Pt become increasingly encephalopathic.  TRH was consulted 1/30 in this setting. She started spiking low grade temps 1/30 (100.7), and became febrile to 103 on 1/31, at which point abx were changed to vanc and zosyn.  Concomitantly, pt has had progressively worsening dyspnea and tachycardia.  CT lumbar spine did not show any surgical site infection.  [name] and ID were consulted on 2/1 in this setting.  MR brain concerning for ventriculitis or meningitis and continued on empiric treatment.  CTA chest concerning for PNA, continued on metronidazole for aspiration PNA.  Pertinent   Medical History  - Bipolar disorder - Mild cog impairment  - Recurrent UTI - Complex Lumbar spine Hx as noted above - T2DM - GERD  Significant Hospital Events: Including procedures, antibiotic start and stop dates in addition to other pertinent events  12/9 - PLIF for unstable L fracture through prior L spine fusion. Admitted 12/9-12/21, dc to SNF 12/21 12/21 - Readmitted 12/21-12/29 for wound dehiscence and dc back to SNF 1/22 - Readmitted for exposed L hardware; OR for removal of left L1-L5 screws, rod and right L1 screw. 1/22 - Started doxycycline. 1/23 - Encephalopathic 1/25 - Started ciprofloxacin 1/26 - Stopped ciprofloxacin, started cefadroxil for possible UTI  1/30 - New leukocytosis, ongoing confusion, fever; TRH consult 1/31 - Worse fever and confusion; abx change to vanc and zosyn 2/1 - Dyspnea, tachycardia. WBC 17.5.  [name] and ID consults. Abx changed to vanc, cefepime, and metronidazole. Admitted to ICU.  Hypoxemic, intubated.  CTA chest concerning for aspiration PNA. 2/2 - MR brain concerning for meningitis, continue empiric treatment w/ vanc and cefepime  Interim History / Subjective:  Patient remained afebrile  Wound VAC was changed yesterday  Patient was given trial of extubation, she failed because of generalized weakness and continued aspiration  White count trended up to 20   Objective   Blood pressure 100/68, pulse 97, temperature 98.4 F (36.9 C), temperature source Axillary, resp. rate (!) 28, height 5' (1.524 m), weight 67.6 kg, SpO2 98 %.    Vent Mode: PRVC FiO2 (%):  [40 %-100 %] 40 % Set Rate:  [28 bmp] 28 bmp Vt Set:  [360 mL] 360 mL PEEP:  [  [place] Pressure:  [13 cmH20-15 cmH20] 13 cmH20   Intake/Output Summary (Last 24 hours) at 08/24/2022 0935 Last data filed at 08/24/2022 0800 Gross per 24 hour  Intake 3449.21 ml  Output 1810 ml  Net 1639.21 ml   Filed Weights   08/06/22 1541 08/09/22 0931  Weight: 65.3 kg 67.6 kg      Physical exam: General: Crtitically ill-appearing elderly female, orally intubated HEENT: /AT, eyes anicteric.  ETT and OGT in place Neuro: Eyes open, not following commands, intermittently tracking examiner, positive pupillary, cough and gag reflexes Chest: Coarse breath sounds, no wheezes or rhonchi Heart: Regular rate and rhythm, no murmurs or gallops Abdomen: Soft, nondistended, bowel sounds present Skin: No rash.  Wound VAC is placed on the back   Labs and images were reviewed  Assessment & Plan:  Sepsis due to meningitis / ventriculitis  Acute septic encephalopathy MRI brain concerning for meningitis/ ventriculitis Could not get LP due to multiple back surgeries now with lumbar wound Patient remained afebrile Heart rate is better in the 90s now Had detailed discussion with infectious disease, recommend continuing cefepime, metronidazole and vancomycin which is  Acute hypoxic respiratory failure Aspiration pneumonia Patient failed extubation trial yesterday, she became tired, tachypneic tachycardic In upper airway lots of secretions noted during intubation I had discussion with family as she is generalized weak it will take long time for her to gain strength and breathe completely on her own, they are in agreement to proceed with tracheostomy  She is on broad-spectrum antibiotics Will repeat respiratory culture today VAP prevention bundle in place Continue with low-dose fentanyl per PAD protocol with RASS goal -1 Continue nebs  Complex Lumbar wound (exposed hardware s/p removal of L L1-L5 screws, rod, R L1 screw on 1/22) No concern for abscess given MR and CT lumbar spine Wound VAC was replaced by wound care nurse yesterday Defer management to neurosurgery  Wound care is following   Hyponatremia/hypokalemia/hypophosphatemia Continue aggressive electrolyte supplement Hypophosphatemia is corrected Closely monitor electrolytes  Anemia of critical illness Monitoring  H&H, transfuse if less than 7  T2DM  Patient hemoglobin A1c 6 Hold metformin Continue sliding scale insulin with CBG goal 140-180 Fingersticks are controlled   Chronic HFrEF HTN CAD, non-obstructive  Repeat echocardiogram consistent with stable ejection fraction of 40-45% Monitor intake and output Will repeat 1 dose of Lasix prior to extubation Continue Coreg Hold Cozaar  Mild cog deficit   Bipolar disorder Continue donepezil, fluvoxumine and lamotrigine Hold gabapentin  Best Practice (right click and "Reselect all SmartList Selections" daily)   Diet/type: tubefeeds DVT prophylaxis: prophylactic heparin  GI prophylaxis: PPI Lines: N/A Foley:  Yes, and it is still needed Code Status:  full code Last date of multidisciplinary goals of care discussion: 2/5 patient's husband, daughters and son was updated at bedside  This patient is critically ill with multiple organ system failure which requires frequent high complexity decision making, assessment, support, evaluation, and titration of therapies. This was completed through the application of advanced monitoring technologies and extensive interpretation of multiple databases.  During this encounter critical care time was devoted to patient care services described in this note for 38 minutes.    Jacky Kindle, MD [facility] See Amion for pager If no response to pager, please call [phone] until 7pm After 7pm, Please call E-link [phone]

## 2022-08-24 NOTE — Progress Notes (Signed)
Pt with approximately 50 mL urine output throughout the day despite Lasix and LR bolus. Pt bladder scanned, per Dr. Tacy Learn. Scan read >999 mL. Foley was flushed with no results. Foley remove and in and out performed yielding 1725 mL. Foley left out and purewick placed. Nightshift RN aware to monitor for retention.   Justice Rocher, RN

## 2022-08-24 NOTE — Progress Notes (Addendum)
[facility]  Date of Admission:  08/09/2022     Total days of antibiotics 15         ASSESSMENT: [name] had a spike in her WBC up to 20. She has been afebrile for the past 24 hours after having waxing and waning temperatures for the previous few days. She was extubated yesterday, but unfortunately after a few hours was re-intubated. Plan for tracheostomy today. Blood cultures have finalized without growth. Lumbar wound vac was changed yesterday. Concern remains for CNS infection and will continue with broad spectrum coverage with vancomycin, cefepime, and metronidazole. Continue therapeutic drug monitoring of vancomycin levels and renal function. Discussed plan of care with family the need for prolonged course of antibiotics given suspected lumbar infection that disseminated to CNS and potential risk for abscess. We also discussed repeat imaging of her brain that would be more useful at least over 1 week from last brain imaging on 08/20/22. Remaining medical and supportive care per CCM and Neurosurgery.   PLAN:  Continue broad spectrum coverage with vancomycin, cefepime, and metronidazole.  Therapeutic drug monitoring of vancomycin levels and renal function.  Consider repeat brain MRI later this week if she is not progressing as expected Monitor repeat respiratory culture taken 2/6 Post-operative wound care per Neurosurgery. Remaining medical and supportive care per [name].   Principal Problem:   Wound dehiscence Active Problems:   Encephalopathy acute   Aspiration pneumonia of both lungs (HCC)   Acute respiratory failure with hypoxia (HCC)   Sepsis (HCC)   Seizure (HCC)   Malnutrition of moderate degree   Bacterial meningitis   Hardware complicating wound infection ([place])   Infection of deep incisional surgical site after procedure    carvedilol  12.5 mg Per Tube BID WC   Chlorhexidine Gluconate Cloth  6 each Topical Daily   docusate  100 mg Per Tube  BID   donepezil  10 mg Per Tube Daily   etomidate  20 mg Intravenous Once   famotidine  20 mg Per Tube BID   fentaNYL (SUBLIMAZE) injection  100 mcg Intravenous Once   fentaNYL (SUBLIMAZE) injection  200 mcg Intravenous Once   fentaNYL (SUBLIMAZE) injection  25 mcg Intravenous Once   furosemide  40 mg Intravenous Once   [name]'s butt cream   Topical Daily   heparin  5,000 Units Subcutaneous Q8H   insulin aspart  0-15 Units Subcutaneous Q4H   ipratropium-albuterol  3 mL Nebulization Q6H   lamoTRIgine  200 mg Per Tube Daily   levothyroxine  88 mcg Per Tube QAC breakfast   midazolam  2 mg Intravenous Once   midazolam  5 mg Intravenous Once   multivitamin with minerals  1 tablet Per Tube Daily   mouth rinse  15 mL Mouth Rinse Q2H   polyethylene glycol  17 g Per Tube Daily   potassium chloride  40 mEq Per Tube Once   rocuronium bromide  100 mg Intravenous Once   sodium chloride flush  10-40 mL Intracatheter Q12H   sodium chloride flush  3 mL Intravenous Q12H   sodium chloride  1 g Per Tube BID WC   thiamine  100 mg Per Tube Daily    SUBJECTIVE: Remains intubated and moderately sedated. Family at bedside.  No Known Allergies   Review of Systems: Unable to perform.    OBJECTIVE: Vitals:   08/24/22 0744 08/24/22 0800 08/24/22 0830 08/24/22 1034  BP:  108/72 100/68   Pulse: (!) 103  100 97 94  Resp: (!) 28 (!) 28 (!) 28 (!) 28  Temp:  98.4 F (36.9 C)    TempSrc:  Axillary    SpO2: 99% 99% 98% 97%  Weight:      Height:       Body mass index is 29.1 kg/m.  Constitutional:Ill appearing elderly female, intubated and sedated.  Cardio:Regular rate and rhythm. Pulm:Intubated and ventilated. Skin:Warm and dry.  Lab Results Lab Results  Component Value Date   WBC 20.1 (H) 08/24/2022   HGB 9.7 (L) 08/24/2022   HCT 29.4 (L) 08/24/2022   MCV 97.7 08/24/2022   PLT 723 (H) 08/24/2022    Lab Results  Component Value Date   CREATININE 0.31 (L) 08/24/2022   BUN 11  08/24/2022   NA 132 (L) 08/24/2022   K 3.4 (L) 08/24/2022   CL 99 08/24/2022   CO2 24 08/24/2022    Lab Results  Component Value Date   ALT 19 08/22/2022   AST 20 08/22/2022   ALKPHOS 67 08/22/2022   BILITOT 0.2 (L) 08/22/2022     Microbiology: Recent Results (from the past 240 hour(s))  C Difficile Quick Screen (NO PCR Reflex)     Status: None   Collection Time: 08/15/22  3:27 PM   Specimen: STOOL  Result Value Ref Range Status   C Diff antigen NEGATIVE NEGATIVE Final   C Diff toxin NEGATIVE NEGATIVE Final   C Diff interpretation No C. difficile detected.  Final    Comment: Performed at [facility], [place] [address] [place], [place] [address]  Culture, blood (Routine X 2) w Reflex to ID Panel     Status: None   Collection Time: 08/17/22  6:59 PM   Specimen: BLOOD  Result Value Ref Range Status   Specimen Description BLOOD LEFT ANTECUBITAL  Final   Special Requests   Final    BOTTLES DRAWN AEROBIC AND ANAEROBIC Blood Culture results may not be optimal due to an inadequate volume of blood received in culture bottles   Culture   Final    NO GROWTH 5 DAYS Performed at [facility], [place] [address], [place], [place] [address]    Report Status 08/22/2022 FINAL  Final  Culture, blood (Routine X 2) w Reflex to ID Panel     Status: None   Collection Time: 08/17/22  7:01 PM   Specimen: BLOOD  Result Value Ref Range Status   Specimen Description BLOOD LEFT ANTECUBITAL  Final   Special Requests   Final    BOTTLES DRAWN AEROBIC AND ANAEROBIC Blood Culture results may not be optimal due to an inadequate volume of blood received in culture bottles   Culture   Final    NO GROWTH 5 DAYS Performed at [facility], [place] [address] [place], [place] [address]    Report Status 08/22/2022 FINAL  Final  Culture, Respiratory w Gram Stain     Status: None   Collection Time: 08/20/22 12:22 AM   Specimen: Tracheal Aspirate; Respiratory  Result Value Ref Range Status    Specimen Description TRACHEAL ASPIRATE  Final   Special Requests NONE  Final   Gram Stain   Final    FEW WBC PRESENT,BOTH PMN AND MONONUCLEAR RARE YEAST WITH PSEUDOHYPHAE Performed at [facility], [address] [place], [place] [address]    Culture FEW CANDIDA ALBICANS  Final   Report Status 08/22/2022 FINAL  Final     [facility]  Johny Blamer, DO Internal Medicine Resident, PGY-1 Pager# [phone]'  08/24/2022  11:16 AM
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
## 2022-08-25 DIAGNOSIS — G009 Bacterial meningitis, unspecified: Secondary | ICD-10-CM | POA: Diagnosis not present

## 2022-08-25 DIAGNOSIS — J9601 Acute respiratory failure with hypoxia: Secondary | ICD-10-CM | POA: Diagnosis not present

## 2022-08-25 DIAGNOSIS — J690 Pneumonitis due to inhalation of food and vomit: Secondary | ICD-10-CM | POA: Diagnosis not present

## 2022-08-25 DIAGNOSIS — G9340 Encephalopathy, unspecified: Secondary | ICD-10-CM | POA: Diagnosis not present

## 2022-08-25 DIAGNOSIS — T847XXD Infection and inflammatory reaction due to other internal orthopedic prosthetic devices, implants and grafts, subsequent encounter: Secondary | ICD-10-CM | POA: Diagnosis not present

## 2022-08-25 DIAGNOSIS — T8130XA Disruption of wound, unspecified, initial encounter: Secondary | ICD-10-CM | POA: Diagnosis not present

## 2022-08-25 LAB — CBC
HCT: 27.6 % — ABNORMAL LOW (ref 36.0–46.0)
Hemoglobin: 8.7 g/dL — ABNORMAL LOW (ref 12.0–15.0)
MCH: 31.8 pg (ref 26.0–34.0)
MCHC: 31.5 g/dL (ref 30.0–36.0)
MCV: 100.7 fL — ABNORMAL HIGH (ref 80.0–100.0)
Platelets: 725 10*3/uL — ABNORMAL HIGH (ref 150–400)
RBC: 2.74 MIL/uL — ABNORMAL LOW (ref 3.87–5.11)
RDW: 20.6 % — ABNORMAL HIGH (ref 11.5–15.5)
WBC: 16.8 10*3/uL — ABNORMAL HIGH (ref 4.0–10.5)
nRBC: 0 % (ref 0.0–0.2)

## 2022-08-25 LAB — BASIC METABOLIC PANEL
Anion gap: 10 (ref 5–15)
BUN: 14 mg/dL (ref 8–23)
CO2: 24 mmol/L (ref 22–32)
Calcium: 8 mg/dL — ABNORMAL LOW (ref 8.9–10.3)
Chloride: 101 mmol/L (ref 98–111)
Creatinine, Ser: <0.3 mg/dL — ABNORMAL LOW (ref 0.44–1.00)
Glucose, Bld: 153 mg/dL — ABNORMAL HIGH (ref 70–99)
Potassium: 4.1 mmol/L (ref 3.5–5.1)
Sodium: 135 mmol/L (ref 135–145)

## 2022-08-25 LAB — MAGNESIUM: Magnesium: 2 mg/dL (ref 1.7–2.4)

## 2022-08-25 LAB — GLUCOSE, CAPILLARY
Glucose-Capillary: 157 mg/dL — ABNORMAL HIGH (ref 70–99)
Glucose-Capillary: 134 mg/dL — ABNORMAL HIGH (ref 70–99)
Glucose-Capillary: 161 mg/dL — ABNORMAL HIGH (ref 70–99)
Glucose-Capillary: 129 mg/dL — ABNORMAL HIGH (ref 70–99)
Glucose-Capillary: 110 mg/dL — ABNORMAL HIGH (ref 70–99)
Glucose-Capillary: 116 mg/dL — ABNORMAL HIGH (ref 70–99)

## 2022-08-25 LAB — PHOSPHORUS: Phosphorus: 3.2 mg/dL (ref 2.5–4.6)

## 2022-08-25 MED ORDER — IPRATROPIUM-ALBUTEROL 0.5-2.5 (3) MG/3ML IN SOLN: 3.0000 mL | Freq: Four times a day (QID) | RESPIRATORY_TRACT | Status: DC | PRN

## 2022-08-25 MED ORDER — GABAPENTIN 250 MG/5ML PO SOLN
100.0000 mg | Freq: Three times a day (TID) | ORAL | Status: DC
  Administered 2022-08-25 – 2022-09-03 (×27): 100 mg
  Filled 2022-08-25 (×34): qty 2

## 2022-08-25 MED ORDER — BETHANECHOL CHLORIDE 10 MG PO TABS
10.0000 mg | ORAL_TABLET | Freq: Three times a day (TID) | ORAL | Status: DC
  Administered 2022-08-25 – 2022-09-03 (×29): 10 mg
  Filled 2022-08-25 (×29): qty 1

## 2022-08-25 MED ORDER — QUETIAPINE FUMARATE 100 MG PO TABS
100.0000 mg | ORAL_TABLET | Freq: Every day | ORAL | Status: DC
  Administered 2022-08-25: 100 mg
  Filled 2022-08-25 (×2): qty 1

## 2022-08-25 MED ORDER — TRAZODONE HCL 100 MG PO TABS
100.0000 mg | ORAL_TABLET | Freq: Every day | ORAL | Status: DC
  Administered 2022-08-25 – 2022-09-02 (×9): 100 mg
  Filled 2022-08-25: qty 2
  Filled 2022-08-25: qty 1
  Filled 2022-08-25 (×4): qty 2
  Filled 2022-08-25: qty 1
  Filled 2022-08-25 (×2): qty 2

## 2022-08-25 MED ORDER — CYCLOBENZAPRINE HCL 10 MG PO TABS
5.0000 mg | ORAL_TABLET | Freq: Three times a day (TID) | ORAL | Status: DC
  Administered 2022-08-25 – 2022-09-03 (×28): 5 mg
  Filled 2022-08-25 (×29): qty 1

## 2022-08-25 MED ORDER — FLUVOXAMINE MALEATE 100 MG PO TABS
100.0000 mg | ORAL_TABLET | Freq: Every day | ORAL | Status: DC
  Administered 2022-08-25 – 2022-09-02 (×9): 100 mg
  Filled 2022-08-25 (×11): qty 1

## 2022-08-25 NOTE — Progress Notes (Signed)
Occupational Therapy Treatment Patient Details Name: MAKENZIE WEISNER MRN: [id number] DOB: Oct 02, 1955 Today's Date: 08/25/2022   History of present illness Patient is a 67 y/o female who presents on 1/22 for revision of complex lumbar wound, removal of left L1-L5 lumbar screws and rod, removal of right L1 screw and revision of L1-L5 posterior instrumented fusion. 1/30 pt febrile and more lethargic in AM, MD ordered CXR. Pt s/p trach on 2/6. PMH includes DM, HTN, anxiety, left TSA, arthritis. Current admission course has been complicated by encephalopathywhich initially seemed 2/2 polypharmacy, then thought possible UTI, and Cefadroxil was added 1/26.  CNS depressing medications were also de-escalated.  Despite these measures, Pt become increasingly encephalopathic.2/1: Admitted to ICU and intubated. 2/2: MR brain concerning for meningitis. 2/6: Pt was extubated although re-intubated due to fatigue and airway protection.   OT comments  Session focused on bilateral WHO fit with adjustments made for presence of edema bilaterally. Family present during session. Initially, recommended 4 hrs on/2 hrs off schedule although during session, pt was able to actively demonstrate right hand movement and family confirmed left hand movement as well. To not impede, pt's ability to work on active hand movement and to encourage her to complete throughout the day, OT changed recommended wearing schedule to just night time splint wear. Family and nursing verbalized understanding. Will continue to monitor appropriateness and need for WHO and make adjustments to fit and wearing schedule as needed.    Recommendations for follow up therapy are one component of a multi-disciplinary discharge planning process, led by the attending physician.  Recommendations may be updated based on patient status, additional functional criteria and insurance authorization.    Follow Up Recommendations  Skilled nursing-short term rehab (<3  hours/day)     Assistance Recommended at Discharge Frequent or constant Supervision/Assistance  Patient can return home with the following  Two people to help with walking and/or transfers;Two people to help with bathing/dressing/bathroom;Assist for transportation;Help with stairs or ramp for entrance   Equipment Recommendations  Other (comment) (defer to next venue of care)       Precautions / Restrictions Precautions Precautions: Back;Fall Precaution Booklet Issued: Yes (comment) Precaution Comments: trach, vent, cortrack Required Braces or Orthoses: Other Brace Other Brace: Provided bilateral WHO. Size small with added strap length to compensate for edema. Restrictions Weight Bearing Restrictions: No              ADL either performed or assessed with clinical judgement    Extremity/Trunk Assessment Upper Extremity Assessment RUE Deficits / Details: Increased pitting edema noted with greater amount today versus yesterday (hand to elbow). Pt actively pulled hand away and made a closed fist during splint adjustments. Pt gave OT "the bird," when Son requested. WHO delivered today. Due to increased swelling, size small not fitting well due to strap length. OT added strap length to accomodate. LUE Deficits / Details: Increased pitting edema noted. Edema was not noticeably present during yesterday's session. WHO delivered today. Due to increased swelling, size small not fitting well due to strap length. OT added strap length to accomodate.             Cognition Arousal/Alertness: Awake/alert Behavior During Therapy: Flat affect Overall Cognitive Status: Impaired/Different from baseline     Following Commands: Follows one step commands with increased time (When Son requested, pt flipped OT "the bird" with her right hand.) Safety/Judgement: Decreased awareness of safety, Decreased awareness of deficits   Problem Solving: Slow processing, Decreased initiation, Difficulty  sequencing, Requires  verbal cues, Requires tactile cues General Comments: Family reports that pt has been more responsive today versus yesterday. Reports that she was giving responsive squeezes with her hands. Pt actively closed her right hand and pulled it away from OT during splint adjustments were being made.        Exercises Other Exercises Other Exercises: patient/family education provided regarding Bilateral WHO including purpose, precautions, donning/doffing techinque and wearing schedule. handout placed at [facility].            Pertinent Vitals/ Pain       Pain Assessment Pain Assessment: Faces Faces Pain Scale: No hurt         Frequency  Min 2X/week        Progress Toward Goals  OT Goals(current goals can now be found in the care plan section)  Progress towards OT goals: Progressing toward goals     Plan Discharge plan remains appropriate;Frequency remains appropriate       AM-PAC OT "6 Clicks" Daily Activity     Outcome Measure   Help from another person eating meals?: Total Help from another person taking care of personal grooming?: Total Help from another person toileting, which includes using toliet, bedpan, or urinal?: Total Help from another person bathing (including washing, rinsing, drying)?: Total Help from another person to put on and taking off regular upper body clothing?: Total Help from another person to put on and taking off regular lower body clothing?: Total 6 Click Score: 6    End of Session    OT Visit Diagnosis: Muscle weakness (generalized) (M62.81);Repeated falls (R29.6)   Activity Tolerance Patient tolerated treatment well   Patient Left in bed;with call bell/phone within reach;with family/visitor present;with nursing/sitter in room           Time: 1449-1530 OT Time Calculation (min): 41 min  Charges: OT General Charges $OT Visit: 1 Visit OT Treatments $Orthotics Fit/Training: 38-52 mins  Ailene Ravel, OTR/L,CBIS   Supplemental OT - [facility] and [facility] Secure Chat Preferred    Jimma Ortman, Clarene Duke 08/25/2022, 4:16 PM

## 2022-08-25 NOTE — Progress Notes (Signed)
Subjective:  Pt with tracheostomy   Antibiotics:  Anti-infectives (From admission, onward)    Start     Dose/Rate Route Frequency Ordered Stop   08/24/22 1400  metroNIDAZOLE (FLAGYL) IVPB 500 mg        500 mg 100 mL/hr over 60 Minutes Intravenous Every 8 hours 08/24/22 0905     08/23/22 1200  vancomycin (VANCOCIN) IVPB 1000 mg/200 mL premix        1,000 mg 200 mL/hr over 60 Minutes Intravenous Every 8 hours 08/23/22 1103     08/20/22 0900  vancomycin (VANCOREADY) IVPB 750 mg/150 mL  Status:  Discontinued        750 mg 150 mL/hr over 60 Minutes Intravenous Every 12 hours 08/20/22 0848 08/23/22 1103   08/20/22 0835  ceFEPIme (MAXIPIME) 2 g in sodium chloride 0.9 % 100 mL IVPB        2 g 200 mL/hr over 30 Minutes Intravenous Every 8 hours 08/20/22 0820     08/19/22 1800  vancomycin (VANCOCIN) IVPB 1000 mg/200 mL premix  Status:  Discontinued        1,000 mg 200 mL/hr over 60 Minutes Intravenous Every 24 hours 08/18/22 1628 08/19/22 0834   08/19/22 1800  vancomycin (VANCOREADY) IVPB 750 mg/150 mL  Status:  Discontinued        750 mg 150 mL/hr over 60 Minutes Intravenous Every 24 hours 08/19/22 0834 08/20/22 0848   08/19/22 1215  metroNIDAZOLE (FLAGYL) IVPB 500 mg  Status:  Discontinued        500 mg 100 mL/hr over 60 Minutes Intravenous Every 12 hours 08/19/22 1118 08/24/22 0905   08/19/22 1215  ceFEPIme (MAXIPIME) 2 g in sodium chloride 0.9 % 100 mL IVPB  Status:  Discontinued        2 g 200 mL/hr over 30 Minutes Intravenous Every 12 hours 08/19/22 1118 08/20/22 0820   08/18/22 1715  piperacillin-tazobactam (ZOSYN) IVPB 3.375 g  Status:  Discontinued        3.375 g 12.5 mL/hr over 240 Minutes Intravenous Every 8 hours 08/18/22 1619 08/19/22 1118   08/18/22 1715  vancomycin (VANCOREADY) IVPB 1500 mg/300 mL        1,500 mg 150 mL/hr over 120 Minutes Intravenous  Once 08/18/22 1628 08/18/22 1938   08/13/22 1000  cefadroxil (DURICEF) capsule 1,000 mg  Status:   Discontinued        1,000 mg over 6 Days Oral 2 times daily 08/13/22 0838 08/18/22 1611   08/12/22 2000  ciprofloxacin (CIPRO) tablet 500 mg  Status:  Discontinued        500 mg Oral 2 times daily 08/12/22 1709 08/13/22 0839   08/10/22 1500  doxycycline (VIBRA-TABS) tablet 100 mg  Status:  Discontinued        100 mg Oral Every 12 hours 08/10/22 1410 08/18/22 1502   08/09/22 2100  ceFAZolin (ANCEF) IVPB 2g/100 mL premix        2 g 200 mL/hr over 30 Minutes Intravenous Every 8 hours 08/09/22 1704 08/10/22 0547   08/09/22 1030  ceFAZolin (ANCEF) IVPB 2g/100 mL premix        2 g 200 mL/hr over 30 Minutes Intravenous On call to O.R. 08/09/22 1019 08/09/22 1315   08/09/22 1022  ceFAZolin (ANCEF) 2-4 GM/100ML-% IVPB       Note to Pharmacy: Marlena Clipper S: cabinet override      08/09/22 1022 08/09/22 1136       Medications:  Scheduled Meds:  bethanechol  10 mg Per Tube TID   carvedilol  12.5 mg Per Tube BID WC   Chlorhexidine Gluconate Cloth  6 each Topical Daily   docusate  100 mg Per Tube BID   donepezil  10 mg Per Tube Daily   famotidine  20 mg Per Tube BID   fentaNYL (SUBLIMAZE) injection  100 mcg Intravenous Once   fentaNYL (SUBLIMAZE) injection  25 mcg Intravenous Once   [name]'s butt cream   Topical Daily   heparin  5,000 Units Subcutaneous Q8H   insulin aspart  0-15 Units Subcutaneous Q4H   ipratropium-albuterol  3 mL Nebulization Q6H   lamoTRIgine  200 mg Per Tube Daily   levothyroxine  88 mcg Per Tube QAC breakfast   midazolam  2 mg Intravenous Once   multivitamin with minerals  1 tablet Per Tube Daily   mouth rinse  15 mL Mouth Rinse Q2H   polyethylene glycol  17 g Per Tube Daily   sodium chloride flush  10-40 mL Intracatheter Q12H   sodium chloride flush  3 mL Intravenous Q12H   sodium chloride  1 g Per Tube BID WC   thiamine  100 mg Per Tube Daily   Continuous Infusions:  ceFEPime (MAXIPIME) IV Stopped (08/25/22 0910)   dexmedetomidine (PRECEDEX) IV infusion  Stopped (08/24/22 0801)   feeding supplement (GLUCERNA 1.5 CAL) 50 mL/hr at 08/25/22 1200   fentaNYL infusion INTRAVENOUS 50 mcg/hr (08/25/22 1100)   metronidazole Stopped (08/25/22 4098)   norepinephrine (LEVOPHED) Adult infusion 5.013 mcg/min (08/25/22 1100)   propofol (DIPRIVAN) infusion Stopped (08/25/22 0550)   vancomycin 1,000 mg (08/25/22 1246)   PRN Meds:.acetaminophen **OR** acetaminophen, diphenoxylate-atropine, fentaNYL, fentaNYL (SUBLIMAZE) injection, fentaNYL (SUBLIMAZE) injection, labetalol, menthol-cetylpyridinium **OR** phenol, ondansetron **OR** ondansetron (ZOFRAN) IV, mouth rinse, oxyCODONE, polyethylene glycol, sodium chloride flush, sodium chloride flush    Objective: Weight change:   Intake/Output Summary (Last 24 hours) at 08/25/2022 1304 Last data filed at 08/25/2022 1200 Gross per 24 hour  Intake 3437.16 ml  Output 1245 ml  Net 2192.16 ml   Blood pressure 138/80, pulse 87, temperature (!) 97.1 F (36.2 C), temperature source Axillary, resp. rate 13, height 5' (1.524 m), weight 67.6 kg, SpO2 100 %. Temp:  [97.1 F (36.2 C)-99.6 F (37.6 C)] 97.1 F (36.2 C) (02/07 1200) Pulse Rate:  [87-109] 87 (02/07 1200) Resp:  [12-29] 13 (02/07 1200) BP: (61-154)/(31-104) 138/80 (02/07 1200) SpO2:  [95 %-100 %] 100 % (02/07 1200) FiO2 (%):  [40 %] 40 % (02/07 0755)  Physical Exam: Physical Exam Constitutional:      Appearance: Normal appearance.  Cardiovascular:     Rate and Rhythm: Normal rate and regular rhythm.  Pulmonary:     Comments: tracheostomy Abdominal:     Palpations: Abdomen is soft.  Neurological:     General: No focal deficit present.     Mental Status: She is alert.  Psychiatric:        Attention and Perception: Attention normal.        Mood and Affect: Mood normal.        Behavior: Behavior normal. Behavior is cooperative.      CBC:    BMET Recent Labs    08/24/22 0520 08/25/22 0410  NA 132* 135  K 3.4* 4.1  CL 99 101  CO2 24 24   GLUCOSE 141* 153*  BUN 11 14  CREATININE 0.31* <0.30*  CALCIUM 7.6* 8.0*     Liver Panel  No results for  input(s): "PROT", "ALBUMIN", "AST", "ALT", "ALKPHOS", "BILITOT", "BILIDIR", "IBILI" in the last 72 hours.     Sedimentation Rate No results for input(s): "ESRSEDRATE" in the last 72 hours. C-Reactive Protein No results for input(s): "CRP" in the last 72 hours.  Micro Results: Recent Results (from the past 720 hour(s))  Surgical pcr screen     Status: Abnormal   Collection Time: 08/09/22 10:15 AM   Specimen: Nasal Mucosa; Nasal Swab  Result Value Ref Range Status   MRSA, PCR NEGATIVE NEGATIVE Corrected   Staphylococcus aureus POSITIVE (A) NEGATIVE Corrected    Comment: (NOTE) The Xpert SA Assay (FDA approved for NASAL specimens in patients 3 years of age and older), is one component of a comprehensive surveillance program. It is not intended to diagnose infection nor to guide or monitor treatment. Performed at [facility], [place] [address] [place], [place] [address] CORRECTED ON 01/22 AT 6270: PREVIOUSLY REPORTED AS POSITIVE   C Difficile Quick Screen (NO PCR Reflex)     Status: None   Collection Time: 08/15/22  3:27 PM   Specimen: STOOL  Result Value Ref Range Status   C Diff antigen NEGATIVE NEGATIVE Final   C Diff toxin NEGATIVE NEGATIVE Final   C Diff interpretation No C. difficile detected.  Final    Comment: Performed at [facility], [place] [address], [place], [place] [address]  Culture, blood (Routine X 2) w Reflex to ID Panel     Status: None   Collection Time: 08/17/22  6:59 PM   Specimen: BLOOD  Result Value Ref Range Status   Specimen Description BLOOD LEFT ANTECUBITAL  Final   Special Requests   Final    BOTTLES DRAWN AEROBIC AND ANAEROBIC Blood Culture results may not be optimal due to an inadequate volume of blood received in culture bottles   Culture   Final    NO GROWTH 5 DAYS Performed at [facility], [place] [address]  [place], [place] [address]    Report Status 08/22/2022 FINAL  Final  Culture, blood (Routine X 2) w Reflex to ID Panel     Status: None   Collection Time: 08/17/22  7:01 PM   Specimen: BLOOD  Result Value Ref Range Status   Specimen Description BLOOD LEFT ANTECUBITAL  Final   Special Requests   Final    BOTTLES DRAWN AEROBIC AND ANAEROBIC Blood Culture results may not be optimal due to an inadequate volume of blood received in culture bottles   Culture   Final    NO GROWTH 5 DAYS Performed at [facility], [place] [address] [place], [place] [address]    Report Status 08/22/2022 FINAL  Final  Culture, Respiratory w Gram Stain     Status: None   Collection Time: 08/20/22 12:22 AM   Specimen: Tracheal Aspirate; Respiratory  Result Value Ref Range Status   Specimen Description TRACHEAL ASPIRATE  Final   Special Requests NONE  Final   Gram Stain   Final    FEW WBC PRESENT,BOTH PMN AND MONONUCLEAR RARE YEAST WITH PSEUDOHYPHAE Performed at [facility], [address] [place], [place] [address]    Culture FEW CANDIDA ALBICANS  Final   Report Status 08/22/2022 FINAL  Final  Culture, Respiratory w Gram Stain     Status: None (Preliminary result)   Collection Time: 08/24/22  9:07 AM   Specimen: Tracheal Aspirate; Respiratory  Result Value Ref Range Status   Specimen Description TRACHEAL ASPIRATE  Final   Special Requests NONE  Final   Gram Stain   Final    MODERATE WBC PRESENT, PREDOMINANTLY PMN NO ORGANISMS SEEN    Culture   Final    CULTURE REINCUBATED FOR BETTER GROWTH Performed at [facility], [place] [address] [place], [place] [address]    Report Status PENDING  Incomplete  Culture, Respiratory w Gram Stain     Status: None (Preliminary result)   Collection Time: 08/24/22  2:55 PM   Specimen: Bronchoalveolar Lavage; Respiratory  Result Value Ref Range Status   Specimen Description BRONCHIAL ALVEOLAR LAVAGE  Final   Special Requests NONE  Final   Gram Stain    Final    ABUNDANT WBC PRESENT, PREDOMINANTLY PMN NO ORGANISMS SEEN    Culture   Final    NO GROWTH < 24 HOURS Performed at [facility], [address] [place], [place] [address]    Report Status PENDING  Incomplete    Studies/Results: DG Chest Port 1 View  Result Date: 08/24/2022 CLINICAL DATA:  Status post tracheostomy. EXAM: PORTABLE CHEST 1 VIEW COMPARISON:  August 23, 2022 FINDINGS: Status post tracheostomy tube placement in satisfactory position. Normal heart size. No evidence of pneumothorax. Bilateral pleural effusions with bilateral streaky airspace opacities. Feeding catheter noted. IMPRESSION: Tracheostomy tube in satisfactory position. Persistent bilateral streaky airspace opacities. Electronically Signed   By: Fidela Salisbury M.D.   On: 08/24/2022 15:26   DG CHEST PORT 1 VIEW  Result Date: 08/23/2022 CLINICAL DATA:  Intubation. EXAM: PORTABLE CHEST 1 VIEW COMPARISON:  Chest radiograph dated 08/22/2022. FINDINGS: Interval retraction of the endotracheal tube with tip approximately 4.5 cm above the carina. Right-sided PICC in similar position. Feeding tube extends below the diaphragm with tip beyond the inferior margin of the image. There is stable cardiomegaly with mild central vascular congestion. Left lung base atelectasis or infiltrate. No large pleural effusion. No pneumothorax. Degenerative changes of the spine and scoliosis. Left shoulder arthroplasty. No acute osseous pathology. IMPRESSION: 1. Interval retraction of the endotracheal tube with tip above the carina. 2. Cardiomegaly with mild central vascular congestion. Electronically Signed   By: Anner Crete M.D.   On: 08/23/2022 20:14      Assessment/Plan:  INTERVAL HISTORY: pt much improved   Principal Problem:   Wound dehiscence Active Problems:   Encephalopathy acute   Aspiration pneumonia of both lungs (HCC)   Acute respiratory failure with hypoxia (HCC)   Sepsis (HCC)   Seizure (HCC)    Malnutrition of moderate degree   Bacterial meningitis   Hardware complicating wound infection ([place])   Infection of deep incisional surgical site after procedure    [name] is a 67 y.o. female with recent vertebral fracture status post hardware placement which was then exposed removed with revision complicated by CSF leak then admitted with fevers found to have postoperative fluid collection multifocal pneumonia thought to be due to aspiration and possible meningitis based on MRI of the brain.  She is now status post tracheostomy and seems dramatically improved.  1.  Aspiration pneumonia more than medically covered with her current antimicrobials of vancomycin and cefepime and metronidazole  2.  Postoperative wound infection: Will treat as a hardware associated discitis/osteomyelitis with at least 6 weeks of postoperative antibiotics and consideration of oral antibiotics.  The area did not appear overtly infected though the hardware was exposed to the world without protection.  3.  Possible meningitis I would recommend MRI of the brain by  08/30/2022  I spent 95minutes with the patient including than 50% of the time in face to face counseling of the patient and family re the above problems personally  along with review of medical records in preparation for the visit and during the visit and in coordination of her care.  I will remove the patient from the ID rounding list  Please call us back once she has MRI brain repeated and certainly if problems arise in the interim.    LOS: 16 days   Alcide Evener 08/25/2022, 1:04 PM

## 2022-08-25 NOTE — TOC Progression Note (Signed)
Transition of Care [facility]) - Progression Note    Patient Details  Name: [name] MRN: [id number] Date of Birth: 1955/11/16  Transition of [facility] CM/SW [place], [place] Phone Number: 08/25/2022, 10:23 AM  Clinical Narrative:    Patient transferred to 4N ICU. CSW will continue to follow.    Expected Discharge Plan: [place] Barriers to Discharge: Continued Medical Work up, Ship broker  Expected Discharge Plan and [place] Choice: [place] Living arrangements for the past 2 months: [place] Determinants of Health (SDOH) Interventions SDOH Screenings   Food Insecurity: No Food Insecurity (06/27/2022)  Housing: Low Risk  (06/27/2022)  Transportation Needs: No Transportation Needs (06/27/2022)  Utilities: Not At Risk (06/27/2022)  Tobacco Use: High Risk (08/09/2022)    Readmission Risk Interventions    07/14/2022    2:53 PM  Readmission Risk Prevention Plan  Transportation Screening Complete  PCP or Specialist Appt within 5-7 Days Complete  Home Care Screening Complete  Medication Review (RN CM) Complete

## 2022-08-25 NOTE — Progress Notes (Signed)
NAME:  [name], MRN:  [id number], DOB:  1955-08-27, LOS: 39 ADMISSION DATE:  08/09/2022, CONSULTATION DATE:  08/19/2022 REFERRING MD:  Darrick Meigs [facility]), CHIEF COMPLAINT:  Dyspnea, encephalopathy  History of Present Illness:  67 yo F PMH mild cognitive decline, bipolar disorder, recurrent UTIs, scoliosis with prior lumbar fusion who was admitted 1/22 for complex lumbar wound/visible lumbar spine hardware.   Has been admitted twice recently prior to current admission -- first for eval of back pain found to be unstable fracture at prior lumbar fusion site requiring PLIF L1-L5 on 12/9. Discharged to [facility] 12/21.  At SNF, wound dehisced and pt presented back to ED same day and was readmitted 12/21-12/29 (second admission), then dc back to SNF.  Her complex lumbar wound further deteriorated at SNF and she presented back to ED 1/22 with exposed lumbar hardware and was admitted (current admission).  She went to OR 1/22 for revision of L1-L5 fusion, removal of left L1-L5 screws and rod, and removal of right L1 screw, revision of L1-L5 posterior instrumented fusion.  She has been on doxy starting 1/24.  Current admission course has been complicated by encephalopathy which initially seemed 2/2 polypharmacy, then thought possible UTI, and Cefadroxil was added 1/26.  CNS depressing medications were also de-escalated.  Despite these measures, Pt become increasingly encephalopathic.  TRH was consulted 1/30 in this setting. She started spiking low grade temps 1/30 (100.7), and became febrile to 103 on 1/31, at which point abx were changed to vanc and zosyn.  Concomitantly, pt has had progressively worsening dyspnea and tachycardia.  CT lumbar spine did not show any surgical site infection.  [name] and ID were consulted on 2/1 in this setting.  MR brain concerning for ventriculitis or meningitis and continued on empiric treatment.  CTA chest concerning for PNA, continued on metronidazole for aspiration PNA.  Pertinent   Medical History  - Bipolar disorder - Mild cog impairment  - Recurrent UTI - Complex Lumbar spine Hx as noted above - T2DM - GERD  Significant Hospital Events: Including procedures, antibiotic start and stop dates in addition to other pertinent events  12/9 - PLIF for unstable L fracture through prior L spine fusion. Admitted 12/9-12/21, dc to SNF 12/21 12/21 - Readmitted 12/21-12/29 for wound dehiscence and dc back to SNF 1/22 - Readmitted for exposed L hardware; OR for removal of left L1-L5 screws, rod and right L1 screw. 1/22 - Started doxycycline. 1/23 - Encephalopathic 1/25 - Started ciprofloxacin 1/26 - Stopped ciprofloxacin, started cefadroxil for possible UTI  1/30 - New leukocytosis, ongoing confusion, fever; TRH consult 1/31 - Worse fever and confusion; abx change to vanc and zosyn 2/1 - Dyspnea, tachycardia. WBC 17.5.  [name] and ID consults. Abx changed to vanc, cefepime, and metronidazole. Admitted to ICU.  Hypoxemic, intubated.  CTA chest concerning for aspiration PNA. 2/2 - MR brain concerning for meningitis, continue empiric treatment w/ vanc and cefepime  Interim History / Subjective:  Patient remained afebrile Had tracheostomy yesterday, tolerated well This morning she is following commands in all 4 extremities, generalized weak Started retaining urine, requiring Foley catheter placement White count trended down to 16 K  Objective   Blood pressure (!) 142/78, pulse (!) 101, temperature (!) 97.5 F (36.4 C), temperature source Axillary, resp. rate 12, height 5' (1.524 m), weight 67.6 kg, SpO2 100 %.    Vent Mode: PSV;CPAP FiO2 (%):  [40 %] 40 % Set Rate:  [28 bmp] 28 bmp Vt Set:  [360 mL] 360  mL PEEP:  [5 cmH20] 5 cmH20 Pressure Support:  [10 cmH20] 10 cmH20 Plateau Pressure:  [12 cmH20-14 cmH20] 12 cmH20   Intake/Output Summary (Last 24 hours) at 08/25/2022 0824 Last data filed at 08/25/2022 0700 Gross per 24 hour  Intake 3392.11 ml  Output 870 ml  Net  2522.11 ml   Filed Weights   08/06/22 1541 08/09/22 0931  Weight: 65.3 kg 67.6 kg    Physical exam: General: Crtitically ill-appearing female, s/p trach HEENT: [place]/AT, eyes anicteric.  ETT and OGT in place Neuro: Eyes open, following commands, generalized weak but moving all 4 extremities Chest: Coarse breath sounds, no wheezes or rhonchi Heart: Regular rate and rhythm, no murmurs or gallops Abdomen: Soft, nondistended, bowel sounds present Skin: No rash.  Wound VAC is placed on the back  Labs and images were reviewed  Assessment & Plan:  Sepsis due to meningitis / ventriculitis  Acute septic encephalopathy MRI brain concerning for meningitis/ ventriculitis Sepsis indices have improved Patient remained afebrile Continue broad-spectrum antibiotics per discussion with ID with cefepime, metronidazole and vancomycin which is  Acute hypoxic respiratory failure s/p trach Aspiration pneumonia Patient underwent percutaneous tracheostomy placement yesterday, tolerated well Currently tolerating spontaneous breathing trial Will try trach collar later today On broad-spectrum antibiotics Respiratory culture showed no growth Continue nebs  Complex Lumbar wound (exposed hardware s/p removal of L L1-L5 screws, rod, R L1 screw on 1/22) Wound VAC in place, draining well Defer management to neurosurgery  Wound care is following   Hyponatremia/hypokalemia/hypophosphatemia, resolved Closely monitor electrolytes  Anemia of critical illness Monitoring H&H, transfuse if less than 7  T2DM  Patient hemoglobin A1c 6 Hold metformin Continue sliding scale insulin with CBG goal 140-180 Fingersticks are controlled   Chronic HFrEF HTN CAD, non-obstructive  Repeat echocardiogram consistent with stable ejection fraction of 40-45% Monitor intake and output Hold Lasix Continue Coreg Hold Cozaar  Mild cog deficit   Bipolar disorder Continue donepezil, fluvoxumine and lamotrigine Hold  gabapentin  Best Practice (right click and "Reselect all SmartList Selections" daily)   Diet/type: tubefeeds DVT prophylaxis: prophylactic heparin  GI prophylaxis: PPI Lines: N/A Foley:  Yes, and it is still needed Code Status:  full code Last date of multidisciplinary goals of care discussion: 2/5 patient's husband, daughters and son was updated at bedside  This patient is critically ill with multiple organ system failure which requires frequent high complexity decision making, assessment, support, evaluation, and titration of therapies. This was completed through the application of advanced monitoring technologies and extensive interpretation of multiple databases.  During this encounter critical care time was devoted to patient care services described in this note for 35 minutes.    Jacky Kindle, MD [facility] See Amion for pager If no response to pager, please call [phone] until 7pm After 7pm, Please call E-link [phone]

## 2022-08-25 NOTE — Patient Instructions (Signed)
OT NOTE  RN STAFF  Please check splint every 4 hours during shift ( remove splint ) to assess for: * pain * redness *swelling  If any symptoms above present remove splint for 15 minutes. If symptoms continue - keep the splint removed and notify OT staff [phone] immediately.   Keep the UE elevated at all times on pillows / towels.  Splint should have two splint covers (blue cloth) with a mesh bag for cleaning. The splint cover (blue cloth) should be washed with soapy water and hung out to dry or washed on delicate in home washer. Do not throw away splint cover it is washable. Please have a set location to store the splints in patients room for daily application.    Splints are to be worn for 4 hours and off for 2 hours  Examples of schedule:  Splints off at 6am Splints on at 8 am Splints off at 12 pm Splints on at 2 pm Splints off at 6pm Splints on at 8 pm    To place the splint on:  1. Place the wrist in position first and secure strap 2. Position each digit and apply strap 3. The thumb and forearm strap should be applied last   The splints should fit as appeared here Strap over the PIP joint of finger Strap over the MCP ( knuckles)  joints of the hand Strap at the thumb Strap at the wrist Strap at the forearm

## 2022-08-25 NOTE — Progress Notes (Signed)
Neurosurgery Service Progress Note  Subjective: Doing better this morning, trach yesterday w/o issue  Objective: Vitals:   08/25/22 0600 08/25/22 0700 08/25/22 0755 08/25/22 0800  BP: (!) 140/82 (!) 142/78  (!) 147/85  Pulse: (!) 103 96 (!) 101 (!) 105  Resp: (!) 28 (!) 22 12 12   Temp:    98.5 F (36.9 C)  TempSrc:    Oral  SpO2: 100% 100% 100% 100%  Weight:      Height:        Physical Exam: Trach in place, awake and interactive, wound vac in place with some small volume serosang output  Assessment & Plan: 67 y.o. female s/p revision of complex lumbar wound, removal of left L1-L5 lumbar screws and rod, removal of right L1 screw, revision of L1-L5 posterior instrumented fusion. Complicated post-operative course   -CCM / ID recs, thankfully starting to move in the right direction   Judith Part, MD

## 2022-08-25 NOTE — Progress Notes (Signed)
Physical Therapy Treatment Patient Details Name: [name] MRN: [id number] DOB: 12/09/1955 Today's Date: 08/25/2022   History of Present Illness Patient is a 67 y/o female who presents on 1/22 for revision of complex lumbar wound, removal of left L1-L5 lumbar screws and rod, removal of right L1 screw and revision of L1-L5 posterior instrumented fusion. 1/30 pt febrile and more lethargic in AM, MD ordered CXR. Pt s/p trach on 2/6. PMH includes DM, HTN, anxiety, left TSA, arthritis. Current admission course has been complicated by encephalopathywhich initially seemed 2/2 polypharmacy, then thought possible UTI, and Cefadroxil was added 1/26.  CNS depressing medications were also de-escalated.  Despite these measures, Pt become increasingly encephalopathic.2/1: Admitted to ICU and intubated. 2/2: MR brain concerning for meningitis. 2/6: Pt was extubated although re-intubated due to fatigue and airway protection.    PT Comments    Pt now on trach on weaning setting. Pt awake but with flat affect. RN and patient family reports pt to have been more awake/alert prior to receiving oxy for pain medicine. Pt continues to track with eyes and turns head slowly to voice. Minimal active command follow. Pt did tolerate sitting EOB x 10 minutes this date with VSS and minimal vent alarms. RN present t/o treatment. Focused on upright posture and WBing/supporting self through UEs. Wound vac on back intact. Acute PT to cont to follow.    Recommendations for follow up therapy are one component of a multi-disciplinary discharge planning process, led by the attending physician.  Recommendations may be updated based on patient status, additional functional criteria and insurance authorization.  Follow Up Recommendations  Skilled nursing-short term rehab (<3 hours/day) Can patient physically be transported by private vehicle: No   Assistance Recommended at Discharge Frequent or constant Supervision/Assistance   Patient can return home with the following Two people to help with walking and/or transfers;A lot of help with bathing/dressing/bathroom;Assist for transportation;Help with stairs or ramp for entrance;Direct supervision/assist for medications management;Assistance with cooking/housework   Equipment Recommendations  None recommended by PT    Recommendations for Other Services Other (comment)     Precautions / Restrictions Precautions Precautions: Back;Fall Precaution Booklet Issued: Yes (comment) Precaution Comments: trach, vent, cortrack Restrictions Weight Bearing Restrictions: No     Mobility  Bed Mobility Overal bed mobility: Needs Assistance Bed Mobility: Supine to Sit, Sit to Supine     Supine to sit: Total assist, +2 for physical assistance, HOB elevated Sit to supine: Total assist, +2 for physical assistance   General bed mobility comments: HOB all the way elevated, helicopter technique utilized. RN assisted, totalA for LE management and trunk elevation as well controlled descent and LE managment back up into bed for return to supine. Pt with no active assist however also didn't resist. VSS stable    Transfers                        Ambulation/Gait                   Stairs             Wheelchair Mobility    Modified Rankin (Stroke Patients Only)       Balance Overall balance assessment: Needs assistance Sitting-balance support: Feet unsupported, Bilateral upper extremity supported Sitting balance-Leahy Scale: Zero Sitting balance - Comments: dependent on posterior support by PT, opened hands up to promote WBing, assisted with L/R lateral weight shifting, assist to hold head up in neutral position, tolerated  10 min sitting EOB                                    Cognition Arousal/Alertness: Awake/alert Behavior During Therapy: Flat affect Overall Cognitive Status: Impaired/Different from baseline                      Current Attention Level: Focused   Following Commands: Follows one step commands inconsistently (follows constantly with head turns/tracks with eyes but not "give me a thumbs up" or "show me 2 fingers") Safety/Judgement: Decreased awareness of safety, Decreased awareness of deficits Awareness: Intellectual Problem Solving: Slow processing, Decreased initiation, Difficulty sequencing, Requires verbal cues, Requires tactile cues General Comments: pt turning head and eyes to name and auditory response. Per RN and family pt more responsive this morning prior to getting oxy for pain medicine. Pt was still alert but more flat compared to prior to receiving pain medicine        Exercises Other Exercises Other Exercises: PROM to bilat LEs Other Exercises: worked on lateral weight shifting through bilat UEs, pt would curl bilat fingers initially with WBing but then tolerated bilat hands in supportive position    General Comments General comments (skin integrity, edema, etc.): VSS HR in 100s, BP stable, RR stable      Pertinent Vitals/Pain Pain Assessment Pain Assessment: Faces Faces Pain Scale: No hurt Pain Location: with VSS and no increased in RR, HR or BP pt appeared relaxed and showed no signs of grimacing Pain Intervention(s): Monitored during session    Home Living                          Prior Function            PT Goals (current goals can now be found in the care plan section) Progress towards PT goals: Progressing toward goals    Frequency    Min 3X/week      PT Plan Current plan remains appropriate    Co-evaluation              AM-PAC PT "6 Clicks" Mobility   Outcome Measure  Help needed turning from your back to your side while in a flat bed without using bedrails?: Total Help needed moving from lying on your back to sitting on the side of a flat bed without using bedrails?: Total Help needed moving to and from a bed to a chair  (including a wheelchair)?: Total Help needed standing up from a chair using your arms (e.g., wheelchair or bedside chair)?: Total Help needed to walk in hospital room?: Total Help needed climbing 3-5 steps with a railing? : Total 6 Click Score: 6    End of Session Equipment Utilized During Treatment: Oxygen (via vent, 40% FIO2, 5 of PEEP via trach) Activity Tolerance: Patient tolerated treatment well Patient left: in bed;with call bell/phone within reach;with bed alarm set;with nursing/sitter in room;with family/visitor present;Other (comment) Nurse Communication: Mobility status PT Visit Diagnosis: Pain;Muscle weakness (generalized) (M62.81)     Time: 4098-1191 PT Time Calculation (min) (ACUTE ONLY): 36 min  Charges:  $Therapeutic Exercise: 8-22 mins $Therapeutic Activity: 8-22 mins                     Kittie Plater, PT, DPT Acute Rehabilitation Services Secure chat preferred Office #: [phone]    Berline Lopes 08/25/2022, 11:49  AM

## 2022-08-25 NOTE — TOC Progression Note (Signed)
Transition of Care [facility]) - Progression Note    Patient Details  Name: [name] MRN: [id number] Date of Birth: Dec 08, 1955  Transition of Care [facility]) CM/SW [place], LCSW Phone Number: 08/25/2022, 10:24 AM  Clinical Narrative:    CSW following for medical progression. CSW notes that new SNF will need to be located due to tracheostomy status.    Expected Discharge Plan: [place] Barriers to Discharge: Continued Medical Work up, Ship broker  Expected Discharge Plan and [place] Choice: [place] Living arrangements for the past 2 months: [place] Determinants of Health (SDOH) Interventions SDOH Screenings   Food Insecurity: No Food Insecurity (06/27/2022)  Housing: Low Risk  (06/27/2022)  Transportation Needs: No Transportation Needs (06/27/2022)  Utilities: Not At Risk (06/27/2022)  Tobacco Use: High Risk (08/09/2022)    Readmission Risk Interventions    07/14/2022    2:53 PM  Readmission Risk Prevention Plan  Transportation Screening Complete  PCP or Specialist Appt within 5-7 Days Complete  Home Care Screening Complete  Medication Review (RN CM) Complete

## 2022-08-25 NOTE — Progress Notes (Signed)
SLP Cancellation Note  Patient Details Name: [name] MRN: [id number] DOB: June 30, 1956   Cancelled treatment:       Reason Eval/Treat Not Completed: Patient not medically ready Patient with new tracheostomy. Orders for SLP eval and treat for PMSV and swallowing received. Pt may trial trach collar later today. Will follow pt closely for readiness for SLP interventions as appropriate.     [name], Katherene Ponto 08/25/2022, 10:45 AM

## 2022-08-25 NOTE — Consult Note (Signed)
[place] Nurse wound follow up Wound type: infectious surgical lumbar wound Measurement: 13 cm suture line with dehiscence noted.  Unchanged today.  Wound bed: yellow fibrin Drainage (amount, consistency, odor) moderate serosanguinous  some oozing serous fluid with dressing change.  Periwound: intact   Dressing procedure/placement/frequency: barrier ring to distal end of wound and where trac pad is placed to protect skin. 1 piece black foam applied. Covered with drape.  Seal achieved at [place].Wed.Fri. Supplies ordered for Friday via UC.  Will follow.  Estrellita Ludwig MSN, RN, FNP-BC CWON Wound, Ostomy, Continence Nurse [facility] [phone] Pager [phone]

## 2022-08-26 DIAGNOSIS — J9601 Acute respiratory failure with hypoxia: Secondary | ICD-10-CM | POA: Diagnosis not present

## 2022-08-26 DIAGNOSIS — E440 Moderate protein-calorie malnutrition: Secondary | ICD-10-CM | POA: Diagnosis not present

## 2022-08-26 DIAGNOSIS — J690 Pneumonitis due to inhalation of food and vomit: Secondary | ICD-10-CM | POA: Diagnosis not present

## 2022-08-26 DIAGNOSIS — T8130XA Disruption of wound, unspecified, initial encounter: Secondary | ICD-10-CM | POA: Diagnosis not present

## 2022-08-26 LAB — COMPREHENSIVE METABOLIC PANEL
ALT: 9 U/L (ref 0–44)
ALT: 12 U/L (ref 0–44)
AST: 15 U/L (ref 15–41)
AST: 20 U/L (ref 15–41)
Albumin: 1.6 g/dL — ABNORMAL LOW (ref 3.5–5.0)
Albumin: 1.6 g/dL — ABNORMAL LOW (ref 3.5–5.0)
Alkaline Phosphatase: 62 U/L (ref 38–126)
Alkaline Phosphatase: 63 U/L (ref 38–126)
Anion gap: 9 (ref 5–15)
Anion gap: 11 (ref 5–15)
BUN: 9 mg/dL (ref 8–23)
BUN: 8 mg/dL (ref 8–23)
CO2: 23 mmol/L (ref 22–32)
CO2: 24 mmol/L (ref 22–32)
Calcium: 7 mg/dL — ABNORMAL LOW (ref 8.9–10.3)
Calcium: 7.2 mg/dL — ABNORMAL LOW (ref 8.9–10.3)
Chloride: 90 mmol/L — ABNORMAL LOW (ref 98–111)
Chloride: 95 mmol/L — ABNORMAL LOW (ref 98–111)
Creatinine, Ser: <0.3 mg/dL — ABNORMAL LOW (ref 0.44–1.00)
Creatinine, Ser: <0.3 mg/dL — ABNORMAL LOW (ref 0.44–1.00)
Glucose, Bld: 438 mg/dL — ABNORMAL HIGH (ref 70–99)
Glucose, Bld: 232 mg/dL — ABNORMAL HIGH (ref 70–99)
Potassium: 3 mmol/L — ABNORMAL LOW (ref 3.5–5.1)
Potassium: 3.7 mmol/L (ref 3.5–5.1)
Sodium: 122 mmol/L — ABNORMAL LOW (ref 135–145)
Sodium: 130 mmol/L — ABNORMAL LOW (ref 135–145)
Total Bilirubin: 1.3 mg/dL — ABNORMAL HIGH (ref 0.3–1.2)
Total Bilirubin: 0.9 mg/dL (ref 0.3–1.2)
Total Protein: 4 g/dL — ABNORMAL LOW (ref 6.5–8.1)
Total Protein: 3.8 g/dL — ABNORMAL LOW (ref 6.5–8.1)

## 2022-08-26 LAB — CBC WITH DIFFERENTIAL/PLATELET
Abs Immature Granulocytes: 0.22 10*3/uL — ABNORMAL HIGH (ref 0.00–0.07)
Basophils Absolute: 0 10*3/uL (ref 0.0–0.1)
Basophils Relative: 0 %
Eosinophils Absolute: 0.1 10*3/uL (ref 0.0–0.5)
Eosinophils Relative: 1 %
HCT: 22.5 % — ABNORMAL LOW (ref 36.0–46.0)
Hemoglobin: 7.5 g/dL — ABNORMAL LOW (ref 12.0–15.0)
Immature Granulocytes: 2 %
Lymphocytes Relative: 11 %
Lymphs Abs: 1.2 10*3/uL (ref 0.7–4.0)
MCH: 33.5 pg (ref 26.0–34.0)
MCHC: 33.3 g/dL (ref 30.0–36.0)
MCV: 100.4 fL — ABNORMAL HIGH (ref 80.0–100.0)
Monocytes Absolute: 0.9 10*3/uL (ref 0.1–1.0)
Monocytes Relative: 8 %
Neutro Abs: 8.8 10*3/uL — ABNORMAL HIGH (ref 1.7–7.7)
Neutrophils Relative %: 78 %
Platelets: 548 10*3/uL — ABNORMAL HIGH (ref 150–400)
RBC: 2.24 MIL/uL — ABNORMAL LOW (ref 3.87–5.11)
RDW: 20.4 % — ABNORMAL HIGH (ref 11.5–15.5)
WBC: 11.3 10*3/uL — ABNORMAL HIGH (ref 4.0–10.5)
nRBC: 0 % (ref 0.0–0.2)

## 2022-08-26 LAB — GLUCOSE, CAPILLARY
Glucose-Capillary: 122 mg/dL — ABNORMAL HIGH (ref 70–99)
Glucose-Capillary: 142 mg/dL — ABNORMAL HIGH (ref 70–99)
Glucose-Capillary: 129 mg/dL — ABNORMAL HIGH (ref 70–99)
Glucose-Capillary: 123 mg/dL — ABNORMAL HIGH (ref 70–99)
Glucose-Capillary: 119 mg/dL — ABNORMAL HIGH (ref 70–99)
Glucose-Capillary: 138 mg/dL — ABNORMAL HIGH (ref 70–99)

## 2022-08-26 LAB — CULTURE, RESPIRATORY W GRAM STAIN

## 2022-08-26 LAB — MAGNESIUM
Magnesium: 1.6 mg/dL — ABNORMAL LOW (ref 1.7–2.4)
Magnesium: 1.7 mg/dL (ref 1.7–2.4)

## 2022-08-26 LAB — PHOSPHORUS: Phosphorus: 3 mg/dL (ref 2.5–4.6)

## 2022-08-26 LAB — TRIGLYCERIDES: Triglycerides: 188 mg/dL — ABNORMAL HIGH (ref <150)

## 2022-08-26 MED ORDER — DOCUSATE SODIUM 50 MG/5ML PO LIQD: 100.0000 mg | Freq: Two times a day (BID) | ORAL | Status: DC | PRN

## 2022-08-26 MED ORDER — FUROSEMIDE 10 MG/ML IJ SOLN
40.0000 mg | Freq: Once | INTRAMUSCULAR | Status: AC
  Administered 2022-08-26: 40 mg via INTRAVENOUS
  Filled 2022-08-26: qty 4

## 2022-08-26 NOTE — Evaluation (Signed)
[facility] - Evaluation Patient Details  Name: [name] MRN: [id number] Date of Birth: 11-29-1955  Today's Date: 08/26/2022 Time: 9518-8416 SLP Time Calculation (min) (ACUTE ONLY): 35 min  Past Medical History:  Past Medical History:  Diagnosis Date   Anemia    Anxiety    Arthritis    Depression    GERD (gastroesophageal reflux disease)    Heart murmur    "related to VSD"   High cholesterol    History of blood transfusion    "related to OR" (08/19/2016)   History of hiatal hernia    Hypertension    Hyperthyroidism    Mild cognitive impairment 09/06/2018   Paroxysmal ventricular tachycardia (HCC)    Type II diabetes mellitus ([place])    UTI (urinary tract infection)    being treated with Keflex   Ventricular septal defect    Past Surgical History:  Past Surgical History:  Procedure Laterality Date   ABDOMINAL HYSTERECTOMY     BACK SURGERY     CARDIAC CATHETERIZATION N/A 06/23/2015   Procedure: Left Heart Cath and Coronary Angiography;  Surgeon: Larey Dresser, MD;  Location: [facility];  Service: Cardiovascular;  Laterality: N/A;   CARDIAC CATHETERIZATION  1960   "VSD was so small; didn't need repaired"   EXAM UNDER ANESTHESIA WITH MANIPULATION OF HIP Right 06/02/2014   dr Rhona Raider   FRACTURE SURGERY     HERNIA REPAIR     HIP CLOSED REDUCTION Right 06/02/2014   Procedure: CLOSED MANIPULATION HIP;  Surgeon: Hessie Dibble, MD;  Location: [facility];  Service: Orthopedics;  Laterality: Right;   JOINT REPLACEMENT     JOINT REPLACEMENT     POSTERIOR LUMBAR FUSION 4 LEVEL N/A 06/26/2022   Procedure: Lumbar One To Lumbar Five Posterior Instrumented Fusion;  Surgeon: Judith Part, MD;  Location: [place];  Service: Neurosurgery;  Laterality: N/A;   REFRACTIVE SURGERY Bilateral    SHOULDER ARTHROSCOPY Right    SHOULDER OPEN ROTATOR CUFF REPAIR Right    SPINAL FUSION  1996   "t10 down to my coccyx   SPINE HARDWARE REMOVAL     TOTAL ABDOMINAL  HYSTERECTOMY     TOTAL HIP ARTHROPLASTY Right 05/10/2014   [place]      by dr Harrell Gave [place]   TOTAL KNEE ARTHROPLASTY Left    TOTAL SHOULDER ARTHROPLASTY Left 08/19/2016   Procedure: TOTAL SHOULDER ARTHROPLASTY;  Surgeon: Tania Ade, MD;  Location: [place];  Service: Orthopedics;  Laterality: Left;  Left total shoulder replacement   HPI:  Patient is a 67 y/o female who presents on 1/22 for revision of complex lumbar wound, removal of left L1-L5 lumbar screws and rod, removal of right L1 screw and revision of L1-L5 posterior instrumented fusion. Current admission has been complicated by encephalopathy which initially seemed 2/2 polypharmacy, then thought possible UTI, and Cefadroxil was added 1/26.  CNS depressing medications were also de-escalated.  Despite these measures, Pt become increasingly encephalopathic. 2/1: Admitted to ICU and intubated. 2/2: MRI brain concerning for meningitis. 2/6: Pt was extubated although re-intubated due to fatigue and airway protection; trached. PMH includes GERD, HH, DM, HTN, anxiety, left TSA, arthritis. Esophagram in 2021 revealed previous Nissen fundoplication as well as moderate to severe esophageal dysmotility.    Assessment / Plan / Recommendation  Clinical Impression  Pt shows good potential for use of PMV, demonstrating some patency of upper airway as she is able to produce small amounts of hoarse phonation. She did however  have frequent coughing that began upon cough deflation, persisting despite pt efforts to expectorate sectretions and RN performing tracheal suction. PMV was still donned to try to facilitate coughing into the upper airway, but unfortunately coughing never subsided until PMV was doffed and cuff was reinflated. Discussed with pt, family, and RN: would use PMV with SLP only for now. Would work on trials of cuff deflation in order to work toward more PMV use, which would also optimize her swallowing to be more ready for swallow  study. SLP Visit Diagnosis: Aphonia (R49.1)    SLP Assessment  Patient needs continued [facility]    Recommendations for follow up therapy are one component of a multi-disciplinary discharge planning process, led by the attending physician.  Recommendations may be updated based on patient status, additional functional criteria and insurance authorization.  Follow Up Recommendations  Skilled nursing-short term rehab (<3 hours/day)    Assistance Recommended at Discharge Frequent or constant Supervision/Assistance  Functional Status Assessment Patient has had a recent decline in their functional status and demonstrates the ability to make significant improvements in function in a reasonable and predictable amount of time.  Frequency and Duration min 2x/week  2 weeks    PMSV Trial PMSV was placed for: few min at a time Able to redirect subglottic air through upper airway: Yes Able to Attain Phonation: Yes Voice Quality: Hoarse;Low vocal intensity Able to Expectorate Secretions: Yes Level of Secretion Expectoration with PMSV: Tracheal (appears to have brought secretions into her mouth but may have re-swallowed them) Breath Support for Phonation: Mildly decreased Intelligibility: Intelligible Respirations During Trial:  (upper 20s to 30) SpO2 During Trial: 97 % Pulse During Trial: 87 Behavior: Alert   Tracheostomy Tube       Vent Dependency       Cuff Deflation Trial Tolerated Cuff Deflation: No Length of Time for Cuff Deflation Trial: ~20 min Behavior: Alert;Cooperative         Osie Bond., M.A. [place] Office [phone]  Secure chat preferred  08/26/2022, 3:29 PM

## 2022-08-26 NOTE — Progress Notes (Addendum)
NAME:  [name], MRN:  [id number], DOB:  17-Oct-1955, LOS: 88 ADMISSION DATE:  08/09/2022, CONSULTATION DATE:  08/19/2022 REFERRING MD:  Darrick Meigs [facility]), CHIEF COMPLAINT:  Dyspnea, encephalopathy  History of Present Illness:  67 yo F PMH mild cognitive decline, bipolar disorder, recurrent UTIs, scoliosis with prior lumbar fusion who was admitted 1/22 for complex lumbar wound/visible lumbar spine hardware.   Has been admitted twice recently prior to current admission -- first for eval of back pain found to be unstable fracture at prior lumbar fusion site requiring PLIF L1-L5 on 12/9. Discharged to [facility] 12/21.  At SNF, wound dehisced and pt presented back to ED same day and was readmitted 12/21-12/29 (second admission), then dc back to SNF.  Her complex lumbar wound further deteriorated at SNF and she presented back to ED 1/22 with exposed lumbar hardware and was admitted (current admission).  She went to OR 1/22 for revision of L1-L5 fusion, removal of left L1-L5 screws and rod, and removal of right L1 screw, revision of L1-L5 posterior instrumented fusion.  She has been on doxy starting 1/24.  Current admission course has been complicated by encephalopathy which initially seemed 2/2 polypharmacy, then thought possible UTI, and Cefadroxil was added 1/26.  CNS depressing medications were also de-escalated.  Despite these measures, Pt become increasingly encephalopathic.  TRH was consulted 1/30 in this setting. She started spiking low grade temps 1/30 (100.7), and became febrile to 103 on 1/31, at which point abx were changed to vanc and zosyn.  Concomitantly, pt has had progressively worsening dyspnea and tachycardia.  CT lumbar spine did not show any surgical site infection.  [name] and ID were consulted on 2/1 in this setting.  MR brain concerning for ventriculitis or meningitis and continued on empiric treatment.  CTA chest concerning for PNA, continued on metronidazole for aspiration PNA.  Pertinent   Medical History  - Bipolar disorder - Mild cog impairment  - Recurrent UTI - Complex Lumbar spine Hx as noted above - T2DM - GERD  Significant Hospital Events: Including procedures, antibiotic start and stop dates in addition to other pertinent events  12/9 - PLIF for unstable L fracture through prior L spine fusion. Admitted 12/9-12/21, dc to SNF 12/21 12/21 - Readmitted 12/21-12/29 for wound dehiscence and dc back to SNF 1/22 - Readmitted for exposed L hardware; OR for removal of left L1-L5 screws, rod and right L1 screw. 1/22 - Started doxycycline. 1/23 - Encephalopathic 1/25 - Started ciprofloxacin 1/26 - Stopped ciprofloxacin, started cefadroxil for possible UTI  1/30 - New leukocytosis, ongoing confusion, fever; TRH consult 1/31 - Worse fever and confusion; abx change to vanc and zosyn 2/1 - Dyspnea, tachycardia. WBC 17.5.  [name] and ID consults. Abx changed to vanc, cefepime, and metronidazole. Admitted to ICU.  Hypoxemic, intubated.  CTA chest concerning for aspiration PNA. 2/2 - MR brain concerning for meningitis, continue empiric treatment w/ vanc and cefepime  Interim History / Subjective:  Patient remained afebrile White count is trending down Remain on trach collar since 1 PM yesterday Complaining of back pain and headache   Objective   Blood pressure 139/75, pulse 97, temperature 97.7 F (36.5 C), temperature source Axillary, resp. rate 13, height 5' (1.524 m), weight 67.6 kg, SpO2 98 %.    FiO2 (%):  [30 %-35 %] 30 %   Intake/Output Summary (Last 24 hours) at 08/26/2022 0847 Last data filed at 08/26/2022 0700 Gross per 24 hour  Intake 2420.96 ml  Output 2175 ml  Net 245.96 ml   Filed Weights   08/06/22 1541 08/09/22 0931  Weight: 65.3 kg 67.6 kg      Physical exam: General: Crtitically ill-appearing female, s/p trach on trach collar HEENT: [place]/AT, eyes anicteric.  Moist mucous membranes Neuro: Awake, alert, following commands, generalized weak but moving all  4 extremities Chest: Faint crackles at bases, no wheezes or rhonchi Heart: Regular rate and rhythm, no murmurs or gallops Abdomen: Soft, nondistended, bowel sounds present Skin: No rash.  Wound VAC is placed on the back  Labs and images were reviewed  Assessment & Plan:  Sepsis due to meningitis / ventriculitis  Lumbar spine osteomyelitis with exposed hardware Acute septic encephalopathy MRI brain concerning for meningitis/ ventriculitis Per discussion with ID, will repeat MRI on 2/12 Patient remained afebrile White count continue to trend down Continue broad-spectrum antibiotics with cefepime, metronidazole and vancomycin, patient will need total of 6 weeks of antibiotics  Acute hypoxic respiratory failure s/p trach Aspiration pneumonia Patient underwent percutaneous tracheostomy placement on 2/6  Remains on trach collar since 1 PM yesterday  Respiratory culture showed no growth Continue nebs  Complex Lumbar wound (exposed hardware s/p removal of L L1-L5 screws, rod, R L1 screw on 1/22) Wound VAC in place, draining well Dressing was changed by wound care nurse yesterday Defer management to neurosurgery  Wound care is following   Hyponatremia Hypokalemia/hypophosphatemia, resolved Closely monitor electrolytes Serum sodium trended down to 130  Anemia of critical illness Monitoring H&H, transfuse if less than 7  T2DM  Patient hemoglobin A1c 6 Hold metformin Continue sliding scale insulin with CBG goal 140-180 Fingersticks are controlled   Chronic HFrEF HTN CAD, non-obstructive  Repeat echocardiogram consistent with stable ejection fraction of 40-45% Monitor intake and output Will give her 1 dose of Lasix today Continue Coreg Continue to hold hold Cozaar  Mild cog deficit   Bipolar disorder Continue donepezil, fluvoxumine and lamotrigine, gabapentin, trazodone  Moderate malnutrition Continue tube feeds  Best Practice (right click and "Reselect all SmartList  Selections" daily)   Diet/type: tubefeeds DVT prophylaxis: prophylactic heparin  GI prophylaxis: PPI Lines: N/A Foley:  Yes, and it is still needed Code Status:  full code Last date of multidisciplinary goals of care discussion: 2/5 patient's husband, daughters and son was updated at bedside  This patient is critically ill with multiple organ system failure which requires frequent high complexity decision making, assessment, support, evaluation, and titration of therapies. This was completed through the application of advanced monitoring technologies and extensive interpretation of multiple databases.  During this encounter critical care time was devoted to patient care services described in this note for 33 minutes.    Jacky Kindle, MD [place] [facility] See Amion for pager If no response to pager, please call [phone] until 7pm After 7pm, Please call E-link [phone]

## 2022-08-26 NOTE — Progress Notes (Signed)
Neurosurgery Service Progress Note  Subjective: NAE ON, on trach collar without issue, couldn't tolerate passey muir earlier today  Objective: Vitals:   08/26/22 1000 08/26/22 1100 08/26/22 1118 08/26/22 1200  BP: 128/75 133/80  131/72  Pulse: 86 82 83 83  Resp: 14 13 13 14   Temp:    97.9 F (36.6 C)  TempSrc:    Oral  SpO2: 96% 100% 99% 95%  Weight:      Height:        Physical Exam: Trach in place, awake and interactive, wound vac in place with some small volume serosang output  Assessment & Plan: 67 y.o. female s/p revision of complex lumbar wound, removal of left L1-L5 lumbar screws and rod, removal of right L1 screw, revision of L1-L5 posterior instrumented fusion. Complicated post-operative course   -CCM recs, appreciate all the great care she is receiving from the ICU nurses, Dr. Tacy Learn, therapists -ID recs, agree with repeating the MRI early next week to re-eval that ventricular debris   Judith Part, MD

## 2022-08-26 NOTE — Progress Notes (Signed)
Physical Therapy Treatment Patient Details Name: [name] MRN: [id number] DOB: 17-Oct-1955 Today's Date: 08/26/2022   History of Present Illness Patient is a 67 y/o female who presents on 1/22 for revision of complex lumbar wound, removal of left L1-L5 lumbar screws and rod, removal of right L1 screw and revision of L1-L5 posterior instrumented fusion. 1/30 pt febrile and more lethargic in AM, MD ordered CXR. Pt s/p trach on 2/6. PMH includes DM, HTN, anxiety, left TSA, arthritis. Current admission course has been complicated by encephalopathywhich initially seemed 2/2 polypharmacy, then thought possible UTI, and Cefadroxil was added 1/26.  CNS depressing medications were also de-escalated.  Despite these measures, Pt become increasingly encephalopathic.2/1: Admitted to ICU and intubated. 2/2: MR brain concerning for meningitis. 2/6: Pt was extubated although re-intubated due to fatigue and airway protection.    PT Comments    PT session focused on sitting tolerance and exercises. Bed egress used to place pt in chair position. Total assist to pull forward in sit and correct posture to midline. AAROM exercises BUE/LE in chair position. Pt tolerated chair position x 25 minutes. HOB returned to 35 degrees at end of session. Rolling R/L +2 total assist for bed pad change. Wedge used to position in slight R sidelying at end of session. Plan is for RN to place pt in chair position again this PM for 30 minutes. This will assist with building sitting tolerance, and prepare for transition to sitting in recliner with maxisky transfers.     Recommendations for follow up therapy are one component of a multi-disciplinary discharge planning process, led by the attending physician.  Recommendations may be updated based on patient status, additional functional criteria and insurance authorization.  Follow Up Recommendations  Skilled nursing-short term rehab (<3 hours/day) Can patient physically be  transported by private vehicle: No   Assistance Recommended at Discharge Frequent or constant Supervision/Assistance  Patient can return home with the following Two people to help with walking and/or transfers;A lot of help with bathing/dressing/bathroom;Assist for transportation;Help with stairs or ramp for entrance;Direct supervision/assist for medications management;Assistance with cooking/housework   Equipment Recommendations  None recommended by PT    Recommendations for Other Services       Precautions / Restrictions Precautions Precautions: Back;Fall;Other (comment) Precaution Comments: trach, vent, cortrack, flexiseal     Mobility  Bed Mobility Overal bed mobility: Needs Assistance Bed Mobility: Rolling Rolling: Total assist, +2 for physical assistance         General bed mobility comments: rolling R/L for bed pad change with RN. Bed egress used to place in chair position. Total assist to pull trunk forward in sit.    Transfers                        Ambulation/Gait                   Stairs             Wheelchair Mobility    Modified Rankin (Stroke Patients Only)       Balance Overall balance assessment: Needs assistance   Sitting balance-Leahy Scale: Zero Sitting balance - Comments: R lean with bed in egress. Total assist to correct to midline. Postural control: Right lateral lean                                  Cognition Arousal/Alertness: Awake/alert Behavior During Therapy:  WFL for tasks assessed/performed Overall Cognitive Status: Difficult to assess                         Following Commands: Follows one step commands with increased time                Exercises Other Exercises Other Exercises: AAROM exercises BUE/LE    General Comments General comments (skin integrity, edema, etc.): VSS.  BP monitored in chair position. Of note, hgb 7.5 this AM.      Pertinent Vitals/Pain Pain  Assessment Pain Assessment: Faces Faces Pain Scale: Hurts even more Pain Location: back with mobility Pain Descriptors / Indicators: Discomfort, Grimacing Pain Intervention(s): Monitored during session, Repositioned, Limited activity within patient's tolerance    Home Living                          Prior Function            PT Goals (current goals can now be found in the care plan section) Acute Rehab PT Goals Patient Stated Goal: go to rehab per family Progress towards PT goals: Progressing toward goals    Frequency    Min 3X/week      PT Plan Current plan remains appropriate    Co-evaluation              AM-PAC PT "6 Clicks" Mobility   Outcome Measure  Help needed turning from your back to your side while in a flat bed without using bedrails?: Total Help needed moving from lying on your back to sitting on the side of a flat bed without using bedrails?: Total Help needed moving to and from a bed to a chair (including a wheelchair)?: Total Help needed standing up from a chair using your arms (e.g., wheelchair or bedside chair)?: Total Help needed to walk in hospital room?: Total Help needed climbing 3-5 steps with a railing? : Total 6 Click Score: 6    End of Session Equipment Utilized During Treatment: Oxygen (via TC) Activity Tolerance: Patient tolerated treatment well Patient left: in bed;with call bell/phone within reach;with family/visitor present Nurse Communication: Mobility status PT Visit Diagnosis: Pain;Muscle weakness (generalized) (M62.81)     Time: 3094-0768 PT Time Calculation (min) (ACUTE ONLY): 40 min  Charges:  $Therapeutic Exercise: 8-22 mins $Therapeutic Activity: 23-37 mins                     [name]., PT  Office # [phone]    [name] 08/26/2022, 12:31 PM

## 2022-08-27 DIAGNOSIS — J9601 Acute respiratory failure with hypoxia: Secondary | ICD-10-CM | POA: Diagnosis not present

## 2022-08-27 DIAGNOSIS — G009 Bacterial meningitis, unspecified: Secondary | ICD-10-CM | POA: Diagnosis not present

## 2022-08-27 DIAGNOSIS — T8130XA Disruption of wound, unspecified, initial encounter: Secondary | ICD-10-CM | POA: Diagnosis not present

## 2022-08-27 DIAGNOSIS — J690 Pneumonitis due to inhalation of food and vomit: Secondary | ICD-10-CM | POA: Diagnosis not present

## 2022-08-27 LAB — BASIC METABOLIC PANEL
Anion gap: 8 (ref 5–15)
BUN: 9 mg/dL (ref 8–23)
CO2: 28 mmol/L (ref 22–32)
Calcium: 7.5 mg/dL — ABNORMAL LOW (ref 8.9–10.3)
Chloride: 92 mmol/L — ABNORMAL LOW (ref 98–111)
Creatinine, Ser: 0.32 mg/dL — ABNORMAL LOW (ref 0.44–1.00)
GFR, Estimated: >60 mL/min (ref >60)
Glucose, Bld: 104 mg/dL — ABNORMAL HIGH (ref 70–99)
Potassium: 3.1 mmol/L — ABNORMAL LOW (ref 3.5–5.1)
Sodium: 128 mmol/L — ABNORMAL LOW (ref 135–145)

## 2022-08-27 LAB — GLUCOSE, CAPILLARY
Glucose-Capillary: 122 mg/dL — ABNORMAL HIGH (ref 70–99)
Glucose-Capillary: 142 mg/dL — ABNORMAL HIGH (ref 70–99)
Glucose-Capillary: 141 mg/dL — ABNORMAL HIGH (ref 70–99)
Glucose-Capillary: 116 mg/dL — ABNORMAL HIGH (ref 70–99)
Glucose-Capillary: 134 mg/dL — ABNORMAL HIGH (ref 70–99)
Glucose-Capillary: 105 mg/dL — ABNORMAL HIGH (ref 70–99)

## 2022-08-27 LAB — CULTURE, RESPIRATORY W GRAM STAIN

## 2022-08-27 LAB — VANCOMYCIN, TROUGH: Vancomycin Tr: 18 ug/mL (ref 15–20)

## 2022-08-27 LAB — MAGNESIUM: Magnesium: 1.8 mg/dL (ref 1.7–2.4)

## 2022-08-27 MED ORDER — POTASSIUM CHLORIDE 20 MEQ PO PACK
40.0000 meq | PACK | Freq: Once | ORAL | Status: AC
  Administered 2022-08-27: 40 meq
  Filled 2022-08-27: qty 2

## 2022-08-27 MED ORDER — OXYCODONE HCL 5 MG PO TABS
7.5000 mg | ORAL_TABLET | ORAL | Status: DC | PRN
  Administered 2022-08-27 – 2022-09-03 (×30): 7.5 mg
  Filled 2022-08-27 (×32): qty 2

## 2022-08-27 MED ORDER — POTASSIUM CHLORIDE 10 MEQ/50ML IV SOLN
10.0000 meq | INTRAVENOUS | Status: AC
  Administered 2022-08-27 (×4): 10 meq via INTRAVENOUS
  Filled 2022-08-27 (×4): qty 50

## 2022-08-27 MED ORDER — MAGNESIUM SULFATE 2 GM/50ML IV SOLN
2.0000 g | Freq: Once | INTRAVENOUS | Status: AC
  Administered 2022-08-27: 2 g via INTRAVENOUS
  Filled 2022-08-27: qty 50

## 2022-08-27 MED ORDER — POTASSIUM CHLORIDE 20 MEQ PO PACK
20.0000 meq | PACK | ORAL | Status: AC
  Administered 2022-08-27 (×2): 20 meq
  Filled 2022-08-27 (×2): qty 1

## 2022-08-27 MED ORDER — FUROSEMIDE 10 MG/ML IJ SOLN
40.0000 mg | Freq: Once | INTRAMUSCULAR | Status: AC
  Administered 2022-08-27: 40 mg via INTRAVENOUS
  Filled 2022-08-27: qty 4

## 2022-08-27 NOTE — Progress Notes (Signed)
Speech Language Pathology Treatment: [name] Speaking valve  Patient Details Name: [name] MRN: [id number] DOB: [birth year] Today's Date: 08/27/2022 Time: [id number] SLP Time Calculation (min) (ACUTE ONLY): 24 min  Assessment / Plan / Recommendation Clinical Impression  Pt shows mild improvements from previous date, including improved ability to have cuff deflated. She does however continue to exhibit coughing spells with PMV placement, with PMV not placed for more than almost a minute at a time. Difficult to discern back pressure from pressure expelled from persistent coughing but VS were stable throughout, and she was able to produce a small amount of phonation again. Recommend PMV be used with SLP only for now, but would encourage working on more prolonged periods of cuff deflation through the weekend.   HPI HPI: Patient is a 67 y/o female who presents on 1/22 for revision of complex lumbar wound, removal of left L1-L5 lumbar screws and rod, removal of right L1 screw and revision of L1-L5 posterior instrumented fusion. Current admission has been complicated by encephalopathy which initially seemed 2/2 polypharmacy, then thought possible UTI, and Cefadroxil was added 1/26.  CNS depressing medications were also de-escalated.  Despite these measures, Pt become increasingly encephalopathic. 2/1: Admitted to ICU and intubated. 2/2: MRI brain concerning for meningitis. 2/6: Pt was extubated although re-intubated due to fatigue and airway protection; trached. PMH includes GERD, HH, DM, HTN, anxiety, left TSA, arthritis. Esophagram in 2021 revealed previous Nissen fundoplication as well as moderate to severe esophageal dysmotility.      SLP Plan  Continue with current plan of care      Recommendations for follow up therapy are one component of a multi-disciplinary discharge planning process, led by the attending physician.  Recommendations may be updated based on patient status, additional  functional criteria and insurance authorization.    Recommendations         Patient may use Passy-Muir Speech Valve: with SLP only         Oral Care Recommendations: Oral care QID Follow Up Recommendations: Skilled nursing-short term rehab (<3 hours/day) Assistance recommended at discharge: Frequent or constant Supervision/Assistance SLP Visit Diagnosis: Aphonia (R49.1) Plan: Continue with current plan of care           Osie Bond., M.A. [place] Office [phone]  Secure chat preferred   08/27/2022, 4:26 PM

## 2022-08-27 NOTE — Progress Notes (Signed)
Patient ID: [name], female   DOB: 1956-04-18, 67 y.o.   MRN: [id number] Patient seen and examined.  This is our first time seeing her in Dr. Barbra Sarks absence.  Trach collar in place.  Follows commands.  Moves her legs fairly well.  VAC changed to happen soon.  Spoke with her daughter at length.[name]

## 2022-08-27 NOTE — Progress Notes (Signed)
NAME:  [name], MRN:  [id number], DOB:  1956/01/14, LOS: 71 ADMISSION DATE:  08/09/2022, CONSULTATION DATE:  08/19/2022 REFERRING MD:  Darrick Meigs [facility]), CHIEF COMPLAINT:  Dyspnea, encephalopathy  History of Present Illness:  67 yo F PMH mild cognitive decline, bipolar disorder, recurrent UTIs, scoliosis with prior lumbar fusion who was admitted 1/22 for complex lumbar wound/visible lumbar spine hardware.   Has been admitted twice recently prior to current admission -- first for eval of back pain found to be unstable fracture at prior lumbar fusion site requiring PLIF L1-L5 on 12/9. Discharged to [facility] 12/21.  At SNF, wound dehisced and pt presented back to ED same day and was readmitted 12/21-12/29 (second admission), then dc back to SNF.  Her complex lumbar wound further deteriorated at SNF and she presented back to ED 1/22 with exposed lumbar hardware and was admitted (current admission).  She went to OR 1/22 for revision of L1-L5 fusion, removal of left L1-L5 screws and rod, and removal of right L1 screw, revision of L1-L5 posterior instrumented fusion.  She has been on doxy starting 1/24.  Current admission course has been complicated by encephalopathy which initially seemed 2/2 polypharmacy, then thought possible UTI, and Cefadroxil was added 1/26.  CNS depressing medications were also de-escalated.  Despite these measures, Pt become increasingly encephalopathic.  TRH was consulted 1/30 in this setting. She started spiking low grade temps 1/30 (100.7), and became febrile to 103 on 1/31, at which point abx were changed to vanc and zosyn.  Concomitantly, pt has had progressively worsening dyspnea and tachycardia.  CT lumbar spine did not show any surgical site infection.  [name] and ID were consulted on 2/1 in this setting.  MR brain concerning for ventriculitis or meningitis and continued on empiric treatment.  CTA chest concerning for PNA, continued on metronidazole for aspiration PNA.  Pertinent   Medical History  - Bipolar disorder - Mild cog impairment  - Recurrent UTI - Complex Lumbar spine Hx as noted above - T2DM - GERD  Significant Hospital Events: Including procedures, antibiotic start and stop dates in addition to other pertinent events  12/9 - PLIF for unstable L fracture through prior L spine fusion. Admitted 12/9-12/21, dc to SNF 12/21 12/21 - Readmitted 12/21-12/29 for wound dehiscence and dc back to SNF 1/22 - Readmitted for exposed L hardware; OR for removal of left L1-L5 screws, rod and right L1 screw. 1/22 - Started doxycycline. 1/23 - Encephalopathic 1/25 - Started ciprofloxacin 1/26 - Stopped ciprofloxacin, started cefadroxil for possible UTI  1/30 - New leukocytosis, ongoing confusion, fever; TRH consult 1/31 - Worse fever and confusion; abx change to vanc and zosyn 2/1 - Dyspnea, tachycardia. WBC 17.5.  [name] and ID consults. Abx changed to vanc, cefepime, and metronidazole. Admitted to ICU.  Hypoxemic, intubated.  CTA chest concerning for aspiration PNA. 2/2 - MR brain concerning for meningitis, continue empiric treatment w/ vanc and cefepime  Interim History / Subjective:  Patient remained on trach collar now close to 48 hours Overnight she had 7 beats of V. tach Electrolytes were repeated showing hypokalemia and hypomagnesemia  Objective   Blood pressure (!) 149/84, pulse 92, temperature 99 F (37.2 C), temperature source Oral, resp. rate 15, height 5' (1.524 m), weight 67.6 kg, SpO2 96 %.    FiO2 (%):  [21 %-30 %] 21 %   Intake/Output Summary (Last 24 hours) at 08/27/2022 0837 Last data filed at 08/27/2022 0600 Gross per 24 hour  Intake 2455.5 ml  Output  3200 ml  Net -744.5 ml   Filed Weights   08/06/22 1541 08/09/22 0931  Weight: 65.3 kg 67.6 kg      Physical exam: General: Elderly female, s/p trach HEENT: [place]/AT, eyes anicteric.  Cortrak in place Neuro: Awake, following commands, moving all 4 extremities, antigravity in all 4  extremities Chest: Coarse breath sounds, no wheezes or rhonchi Heart: Regular rate and rhythm, no murmurs or gallops Abdomen: Soft, nondistended, bowel sounds present Skin: No rash.  Wound VAC in place on the back  Labs and images were reviewed  Assessment & Plan:  Sepsis due to meningitis / ventriculitis  Lumbar spine osteomyelitis with exposed hardware Acute septic encephalopathy, resolved MRI brain concerning for meningitis/ ventriculitis Per discussion with ID, will repeat MRI on 2/12 Repeat CBC tomorrow to see white count Continue broad-spectrum antibiotics with cefepime, metronidazole and vancomycin, patient will need total of 6 weeks of antibiotics  Acute hypoxic respiratory failure s/p trach Aspiration pneumonia Patient underwent percutaneous tracheostomy placement on 2/6  Remained on trach collar for close to 48 hours Continue nebs  Complex Lumbar wound (exposed hardware s/p removal of L L1-L5 screws, rod, R L1 screw on 1/22) Wound VAC in place, draining well Wound VAC dressing changes scheduled for today Defer management to neurosurgery  Wound care is following   Hyponatremia Hypokalemia//hypomagnesemia/ Hypophosphatemia Short run of V. Tach Overnight patient had 7 beats of V. tach Serum potassium is 3.1 and magnesium 1.7 Serum sodium remained at 128 Continue aggressive electrolyte supplement and repeat  Anemia of critical illness Monitoring H&H, transfuse if less than 7 Hemoglobin is slowly trending down, no signs of active bleeding  T2DM  Patient hemoglobin A1c 6 Hold metformin Continue sliding scale insulin with CBG goal 140-180 Fingersticks are controlled   Chronic HFrEF HTN CAD, non-obstructive  Repeat echocardiogram consistent with stable ejection fraction of 40-45% Monitor intake and output Will give her 1 dose of Lasix today again Continue Coreg Continue to hold Cozaar  Mild cog deficit   Bipolar disorder Continue donepezil, fluvoxumine and  lamotrigine, gabapentin, trazodone Holding BuSpar and quetiapine  Moderate malnutrition Continue tube feeds  Best Practice (right click and "Reselect all SmartList Selections" daily)   Diet/type: tubefeeds DVT prophylaxis: prophylactic heparin  GI prophylaxis: PPI Lines: N/A Foley:  Yes, and it is still needed Code Status:  full code Last date of multidisciplinary goals of care discussion: 2/9 patient's daughter and son updated at bedside  This patient is critically ill with multiple organ system failure which requires frequent high complexity decision making, assessment, support, evaluation, and titration of therapies. This was completed through the application of advanced monitoring technologies and extensive interpretation of multiple databases.  During this encounter critical care time was devoted to patient care services described in this note for 32 minutes.    Jacky Kindle, MD [place] [facility] See Amion for pager If no response to pager, please call [phone] until 7pm After 7pm, Please call E-link [phone]

## 2022-08-27 NOTE — Consult Note (Signed)
[facility] Nurse wound follow up NPWT wound dehiscence from revision of L1-L5 08/09/2022  Wound type: surgical  Measurement: 13 cms suture line with 0.3 cm dehiscence noted along 80%  Wound bed: 50% red moist 50% yellow fibrin  Drainage (amount, consistency, odor) minimal serosanguinous  Periwound: intact  Dressing procedure/placement/frequency: Removed old NPWT dressing Cleansed wound with normal saline Barrier ring placed at distal and proximal portion of wound.  Filled wound with  1 piece of black foam  Sealed NPWT dressing at 133m HG without issue  Patient received PO pain medication per bedside nurse prior to dressing change Patient tolerated procedure well  WOC nurse will continue to provide NPWT dressing changed due to the complexity of the dressing change.  Next NPWT dressing change Monday 08/30/2022.    Thank you,    Madigan Rosensteel MSN, RN-BC, [employer]

## 2022-08-27 NOTE — Progress Notes (Signed)
[place] Progress Note Patient Name: [name] DOB: [birth year] MRN: [id number]   Date of Service  08/27/2022  HPI/Events of Note  Nursing reports 7 beat run of NSVT.  eICU Interventions  Plan: BMP and Mg++ level STAT.     Intervention Category Major Interventions: Arrhythmia - evaluation and management  Maher Shon Cornelia Copa 08/27/2022, 12:55 AM

## 2022-08-27 NOTE — Progress Notes (Signed)
Physical Therapy Treatment Patient Details Name: [name] MRN: [id number] DOB: 02-24-1956 Today's Date: 08/27/2022   History of Present Illness Patient is a 67 y/o female who presents on 1/22 for revision of complex lumbar wound, removal of left L1-L5 lumbar screws and rod, removal of right L1 screw and revision of L1-L5 posterior instrumented fusion. 1/30 pt febrile and more lethargic in AM, MD ordered CXR. Pt s/p trach on 2/6. PMH includes DM, HTN, anxiety, left TSA, arthritis. Current admission course has been complicated by encephalopathywhich initially seemed 2/2 polypharmacy, then thought possible UTI, and Cefadroxil was added 1/26.  CNS depressing medications were also de-escalated.  Despite these measures, Pt become increasingly encephalopathic.2/1: Admitted to ICU and intubated. 2/2: MR brain concerning for meningitis. 2/6: Pt was extubated although re-intubated due to fatigue and airway protection.    PT Comments    PT session focused on UE/LE exercises in supine. Chair position/EOB deferred due to pt scheduled for wound vac change at 0930. Recommend nursing utilize bed egress, for minimum of 30 minutes in chair position this PM. Also over weekend, minimum of 30 min in AM and 30 min in PM with bed in chair position. PT will plan to progress EOB vs OOB with maxisky during next week's sessions.    Recommendations for follow up therapy are one component of a multi-disciplinary discharge planning process, led by the attending physician.  Recommendations may be updated based on patient status, additional functional criteria and insurance authorization.  Follow Up Recommendations  Skilled nursing-short term rehab (<3 hours/day) Can patient physically be transported by private vehicle: No   Assistance Recommended at Discharge Frequent or constant Supervision/Assistance  Patient can return home with the following Two people to help with walking and/or transfers;A lot of help with  bathing/dressing/bathroom;Assist for transportation;Help with stairs or ramp for entrance;Direct supervision/assist for medications management;Assistance with cooking/housework   Equipment Recommendations  None recommended by PT    Recommendations for Other Services       Precautions / Restrictions Precautions Precautions: Back;Fall;Other (comment) Precaution Comments: trach, cortrack, flexiseal, wound vac on back Restrictions Weight Bearing Restrictions: No Other Position/Activity Restrictions: external rotation R hip at baseline     Mobility  Bed Mobility               General bed mobility comments: chair position/EOB deferred due to scheduled for wound vac change this AM    Transfers                        Ambulation/Gait                   Stairs             Wheelchair Mobility    Modified Rankin (Stroke Patients Only)       Balance                                            Cognition Arousal/Alertness: Awake/alert Behavior During Therapy: Flat affect Overall Cognitive Status: Difficult to assess                                 General Comments: Following simple commands consistently. Appears more fatigued today.        Exercises General Exercises - Upper Extremity Shoulder  Flexion: AAROM, Right, Left, 10 reps Elbow Flexion: AAROM, Right, Left, 10 reps Elbow Extension: AAROM, Right, Left, 10 reps General Exercises - Lower Extremity Ankle Circles/Pumps: AROM, Both, 10 reps Gluteal Sets: AROM, Both, 10 reps Heel Slides: AAROM, Right, Left, 10 reps Hip ABduction/ADduction: AAROM, Right, Left, 10 reps    General Comments        Pertinent Vitals/Pain Pain Assessment Pain Assessment: Faces Faces Pain Scale: Hurts even more Pain Location: back with mobility Pain Descriptors / Indicators: Grimacing, Guarding, Discomfort Pain Intervention(s): Limited activity within patient's tolerance,  Monitored during session, Premedicated before session    Home Living                          Prior Function            PT Goals (current goals can now be found in the care plan section) Acute Rehab PT Goals Patient Stated Goal: go to rehab per family Progress towards PT goals: Progressing toward goals    Frequency    Min 3X/week      PT Plan Current plan remains appropriate    Co-evaluation              AM-PAC PT "6 Clicks" Mobility   Outcome Measure  Help needed turning from your back to your side while in a flat bed without using bedrails?: Total Help needed moving from lying on your back to sitting on the side of a flat bed without using bedrails?: Total Help needed moving to and from a bed to a chair (including a wheelchair)?: Total Help needed standing up from a chair using your arms (e.g., wheelchair or bedside chair)?: Total Help needed to walk in hospital room?: Total Help needed climbing 3-5 steps with a railing? : Total 6 Click Score: 6    End of Session Equipment Utilized During Treatment: Oxygen (via TC) Activity Tolerance: Patient tolerated treatment well Patient left: in bed;with call bell/phone within reach;with family/visitor present Nurse Communication: Mobility status PT Visit Diagnosis: Pain;Muscle weakness (generalized) (M62.81)     Time: [id number] PT Time Calculation (min) (ACUTE ONLY): 16 min  Charges:  $Therapeutic Exercise: 8-22 mins                     Gloriann Loan., PT  Office # [phone]    [name] 08/27/2022, 11:06 AM

## 2022-08-27 NOTE — Progress Notes (Addendum)
Pt had 7 beat run of Vtach at 0046.  Strip printed and placed in pt's paper chart.  [place] notified.  Labs ordered.  No other orders at this time.  Daughter Judson Roch at bedside.  Sarah informed and updated on plan of care.

## 2022-08-27 NOTE — Progress Notes (Signed)
[facility] ADULT ICU REPLACEMENT PROTOCOL   The patient does apply for the [facility] Adult ICU Electrolyte Replacment Protocol based on the criteria listed below:   1.Exclusion criteria: TCTS, ECMO, Dialysis, and Myasthenia Gravis patients 2. Is GFR >/= 30 ml/min? Yes.    Patient's GFR today is >60 3. Is SCr </= 2? Yes.   Patient's SCr is 0.32 mg/dL 4. Did SCr increase >/= 0.5 in 24 hours? No. 5.Pt's weight >40kg  Yes.   6. Abnormal electrolyte(s): potassium 3.1, mag 1.8  7. Electrolytes replaced per protocol 8.  Call MD STAT for K+ </= 2.5, Phos </= 1, or Mag </= 1 Physician:  protocol  Darlys Gales 08/27/2022 2:21 AM

## 2022-08-28 DIAGNOSIS — G0490 Encephalitis and encephalomyelitis, unspecified: Secondary | ICD-10-CM

## 2022-08-28 LAB — CBC WITH DIFFERENTIAL/PLATELET
Abs Immature Granulocytes: 0.13 10*3/uL — ABNORMAL HIGH (ref 0.00–0.07)
Basophils Absolute: 0.1 10*3/uL (ref 0.0–0.1)
Basophils Relative: 1 %
Eosinophils Absolute: 0 10*3/uL (ref 0.0–0.5)
Eosinophils Relative: 0 %
HCT: 27.6 % — ABNORMAL LOW (ref 36.0–46.0)
Hemoglobin: 8.9 g/dL — ABNORMAL LOW (ref 12.0–15.0)
Immature Granulocytes: 1 %
Lymphocytes Relative: 14 %
Lymphs Abs: 1.5 10*3/uL (ref 0.7–4.0)
MCH: 32.4 pg (ref 26.0–34.0)
MCHC: 32.2 g/dL (ref 30.0–36.0)
MCV: 100.4 fL — ABNORMAL HIGH (ref 80.0–100.0)
Monocytes Absolute: 1 10*3/uL (ref 0.1–1.0)
Monocytes Relative: 10 %
Neutro Abs: 7.7 10*3/uL (ref 1.7–7.7)
Neutrophils Relative %: 74 %
Platelets: 646 10*3/uL — ABNORMAL HIGH (ref 150–400)
RBC: 2.75 MIL/uL — ABNORMAL LOW (ref 3.87–5.11)
RDW: 19.9 % — ABNORMAL HIGH (ref 11.5–15.5)
WBC: 10.4 10*3/uL (ref 4.0–10.5)
nRBC: 0 % (ref 0.0–0.2)

## 2022-08-28 LAB — BASIC METABOLIC PANEL
Anion gap: 10 (ref 5–15)
Anion gap: 11 (ref 5–15)
BUN: 7 mg/dL — ABNORMAL LOW (ref 8–23)
BUN: 9 mg/dL (ref 8–23)
CO2: 27 mmol/L (ref 22–32)
CO2: 29 mmol/L (ref 22–32)
Calcium: 7.9 mg/dL — ABNORMAL LOW (ref 8.9–10.3)
Calcium: 8 mg/dL — ABNORMAL LOW (ref 8.9–10.3)
Chloride: 91 mmol/L — ABNORMAL LOW (ref 98–111)
Chloride: 91 mmol/L — ABNORMAL LOW (ref 98–111)
Creatinine, Ser: <0.3 mg/dL — ABNORMAL LOW (ref 0.44–1.00)
Creatinine, Ser: <0.3 mg/dL — ABNORMAL LOW (ref 0.44–1.00)
Glucose, Bld: 110 mg/dL — ABNORMAL HIGH (ref 70–99)
Glucose, Bld: 130 mg/dL — ABNORMAL HIGH (ref 70–99)
Potassium: 3.8 mmol/L (ref 3.5–5.1)
Potassium: 4 mmol/L (ref 3.5–5.1)
Sodium: 129 mmol/L — ABNORMAL LOW (ref 135–145)
Sodium: 130 mmol/L — ABNORMAL LOW (ref 135–145)

## 2022-08-28 LAB — GLUCOSE, CAPILLARY
Glucose-Capillary: 131 mg/dL — ABNORMAL HIGH (ref 70–99)
Glucose-Capillary: 143 mg/dL — ABNORMAL HIGH (ref 70–99)
Glucose-Capillary: 154 mg/dL — ABNORMAL HIGH (ref 70–99)
Glucose-Capillary: 131 mg/dL — ABNORMAL HIGH (ref 70–99)
Glucose-Capillary: 112 mg/dL — ABNORMAL HIGH (ref 70–99)
Glucose-Capillary: 122 mg/dL — ABNORMAL HIGH (ref 70–99)

## 2022-08-28 LAB — PHOSPHORUS: Phosphorus: 3.5 mg/dL (ref 2.5–4.6)

## 2022-08-28 LAB — MAGNESIUM
Magnesium: 2 mg/dL (ref 1.7–2.4)
Magnesium: 2 mg/dL (ref 1.7–2.4)

## 2022-08-28 NOTE — Progress Notes (Signed)
NEUROSURGERY PROGRESS NOTE  Patient doing ok, still MAE. Reports some mild pain in her back. No new nsgy recom. Continue wound vac and therapy.   Temp:  [98.3 F (36.8 C)-99.4 F (37.4 C)] 99.4 F (37.4 C) (02/10 0000) Pulse Rate:  [79-98] 84 (02/10 0100) Resp:  [13-19] 15 (02/10 0100) BP: (122-150)/(70-112) 143/84 (02/10 0100) SpO2:  [86 %-100 %] 97 % (02/10 0100) FiO2 (%):  [21 %-28 %] 28 % (02/09 2308)   Eleonore Chiquito, NP 08/28/2022 3:36 AM

## 2022-08-28 NOTE — Progress Notes (Signed)
NAME:  [name], MRN:  [id number], DOB:  [birth year], LOS: 68 ADMISSION DATE:  08/09/2022, CONSULTATION DATE:  08/19/2022 REFERRING MD:  Darrick Meigs [facility]), CHIEF COMPLAINT:  Dyspnea, encephalopathy  History of Present Illness:  67 yo F PMH mild cognitive decline, bipolar disorder, recurrent UTIs, scoliosis with prior lumbar fusion who was admitted 1/22 for complex lumbar wound/visible lumbar spine hardware.   Has been admitted twice recently prior to current admission -- first for eval of back pain found to be unstable fracture at prior lumbar fusion site requiring PLIF L1-L5 on 12/9. Discharged to [facility] 12/21.  At SNF, wound dehisced and pt presented back to ED same day and was readmitted 12/21-12/29 (second admission), then dc back to SNF.  Her complex lumbar wound further deteriorated at SNF and she presented back to ED 1/22 with exposed lumbar hardware and was admitted (current admission).  She went to OR 1/22 for revision of L1-L5 fusion, removal of left L1-L5 screws and rod, and removal of right L1 screw, revision of L1-L5 posterior instrumented fusion.  She has been on doxy starting 1/24.  Current admission course has been complicated by encephalopathy which initially seemed 2/2 polypharmacy, then thought possible UTI, and Cefadroxil was added 1/26.  CNS depressing medications were also de-escalated.  Despite these measures, Pt become increasingly encephalopathic.  TRH was consulted 1/30 in this setting. She started spiking low grade temps 1/30 (100.7), and became febrile to 103 on 1/31, at which point abx were changed to vanc and zosyn.  Concomitantly, pt has had progressively worsening dyspnea and tachycardia.  CT lumbar spine did not show any surgical site infection.  [name] and ID were consulted on 2/1 in this setting.  MR brain concerning for ventriculitis or meningitis and continued on empiric treatment.  CTA chest concerning for PNA, continued on metronidazole for aspiration PNA.  Pertinent   Medical History  - Bipolar disorder - Mild cog impairment  - Recurrent UTI - Complex Lumbar spine Hx as noted above - T2DM - GERD  Significant Hospital Events: Including procedures, antibiotic start and stop dates in addition to other pertinent events  12/9 - PLIF for unstable L fracture through prior L spine fusion. Admitted 12/9-12/21, dc to SNF 12/21 12/21 - Readmitted 12/21-12/29 for wound dehiscence and dc back to SNF 1/22 - Readmitted for exposed L hardware; OR for removal of left L1-L5 screws, rod and right L1 screw. 1/22 - Started doxycycline. 1/23 - Encephalopathic 1/25 - Started ciprofloxacin 1/26 - Stopped ciprofloxacin, started cefadroxil for possible UTI  1/30 - New leukocytosis, ongoing confusion, fever; TRH consult 1/31 - Worse fever and confusion; abx change to vanc and zosyn 2/1 - Dyspnea, tachycardia. WBC 17.5.  [name] and ID consults. Abx changed to vanc, cefepime, and metronidazole. Admitted to ICU.  Hypoxemic, intubated.  CTA chest concerning for aspiration PNA. 2/2 - MR brain concerning for meningitis, continue empiric treatment w/ vanc and cefepime  Interim History / Subjective:  Remains on trach collar.  Having coughing spell when fitted with PM valve yesterday.  Objective   Blood pressure (!) 144/83, pulse 85, temperature 98.6 F (37 C), temperature source Axillary, resp. rate 13, height 5' (1.524 m), weight 67.6 kg, SpO2 98 %.    FiO2 (%):  [28 %] 28 %   Intake/Output Summary (Last 24 hours) at 08/28/2022 0812 Last data filed at 08/28/2022 0700 Gross per 24 hour  Intake 2507.02 ml  Output 6000 ml  Net -3492.98 ml   [employer]  08/06/22 1541 08/09/22 0931  Weight: 65.3 kg 67.6 kg     General - sleepy Eyes - pupils reactive ENT - trach site clean Cardiac - regular rate/rhythm, no murmur Chest - equal breath sounds b/l, no wheezing or rales Abdomen - soft, non tender, + bowel sounds Extremities - no cyanosis, clubbing, or edema Skin - no  rashes Neuro - wakes up easily with stimulation   Resolved Problems:  Sepsis, Acute metabolic encephalopathy 2nd to sepsis, Acute hypoxic respiratory failure, Aspiration pneumonitis, VT likely from hypokalemia  Assessment & Plan:   Meningitis/ventriculitis in 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 - plane for f/u MRI on 2/12 - neurosurgery and wound care following - per ID will need total of 6 weeks abx therapy >> continue vancomycin, flagyl, cefepime  Compromised airway. - S/p tracheostomy 2/06 - trach care - plan to remove trach sutures after 2/13 - goal SpO2 > 92% - f/u CXR intermittently - speech therapy for PM valve and swallow assessment  Hyponatremia. - f/u BMET - continue salt tablets   Anemia of critical illness. - f/u CBC - transfuse for Hb < 7  DM type 2. - SSI   Chronic combined CHF, CAD, HTN. - goal BP normotensive - continue coreg  Urine retention. - continue in/out cath for next 24 hours - continue urecholine  Hx of Bipolar, mild cognitive deficit. - continue aricept, luvox, neurontin, lamictal, trazodone  Hx of hypothyroidism. - continue synthroid  Moderate malnutrition. - tube feeds  Best Practice (right click and "Reselect all SmartList Selections" daily)   Diet/type: tubefeeds DVT prophylaxis: prophylactic heparin  GI prophylaxis: H2B Lines: Central line Foley:  N/A Code Status:  full code Last date of multidisciplinary goals of care discussion: Updated her daughter at bedside  Labs:      Latest Ref Rng & Units 08/28/2022    4:50 AM 08/27/2022   11:54 PM 08/27/2022    1:02 AM  CMP  Glucose 70 - 99 mg/dL 130  110  104   BUN 8 - 23 mg/dL 7  9  9   $ Creatinine 0.44 - 1.00 mg/dL <0.30  <0.30  0.32   Sodium 135 - 145 mmol/L 130  129  128   Potassium 3.5 - 5.1 mmol/L 3.8  4.0  3.1   Chloride 98 - 111 mmol/L 91  91  92   CO2 22 - 32 mmol/L 29  27  28   $ Calcium 8.9 - 10.3 mg/dL 7.9  8.0  7.5        Latest Ref  Rng & Units 08/28/2022    4:50 AM 08/26/2022    5:00 AM 08/25/2022    4:10 AM  CBC  WBC 4.0 - 10.5 K/uL 10.4  11.3  16.8   Hemoglobin 12.0 - 15.0 g/dL 8.9  7.5  8.7   Hematocrit 36.0 - 46.0 % 27.6  22.5  27.6   Platelets 150 - 400 K/uL 646  548  725     CBG (last 3)  Recent Labs    08/27/22 2315 08/28/22 0336 08/28/22 0739  GLUCAP 105* 131* 143*   Signature:  Chesley Mires, MD [place] Pager - [phone] or [phone] 08/28/2022, 8:22 AM

## 2022-08-28 NOTE — Progress Notes (Addendum)
eLink Physician-Brief Progress Note Patient Name: [name] DOB: 02/07/1956 MRN: [id number]   Date of Service  08/28/2022  HPI/Events of Note  Urinary retention. Already on urecholine. Has had 4 foleys since admission. Had already done I+O x3 since last foley was removed.  eICU Interventions  Replace Foley   0502 - K 3.4 , kcl given   Intervention Category Minor Interventions: Routine modifications to care plan (e.g. PRN medications for pain, fever)  Jihan Mellette 08/28/2022, 8:47 PM

## 2022-08-28 NOTE — Progress Notes (Signed)
Pharmacy Antibiotic Note  [name] is a 67 y.o. 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 Pharmacy has been consulted for Vancomycin + Cefepime dosing.   A Vancomycin trough this morning resulted as therapeutic at 15 mcg/ml (goal of 15-20 mcg/ml to target CNS concentrations). SCr stable at 0.31, UOP >0.5 ml/kg/hg.   Plan: - Continue Vancomycin to 1g IV every 8 hours - Will plan to recheck a trough 2/12 to further assess accumulation  - Continue Cefepime 2g IV every 8 hours and Flagyl 500 mg IV every 8 hours - Will continue to follow renal function, culture results, LOT, and antibiotic de-escalation plans   Height: 5' (152.4 cm) Weight:  (bed scale not working) IBW/kg (Calculated) : 45.5  Temp (24hrs), Avg:98.8 F (37.1 C), Min:98.3 F (36.8 C), Max:99.4 F (37.4 C)  Recent Labs  Lab 08/23/22 0535 08/23/22 0857 08/24/22 0520 08/24/22 1142 08/25/22 0410 08/26/22 0500 08/26/22 0731 08/27/22 0102 08/27/22 1946 08/27/22 2354 08/28/22 0450  WBC 13.6*  --  20.1*  --  16.8* 11.3*  --   --   --   --  10.4  CREATININE <0.30*  --  0.31*  --  <0.30* <0.30* <0.30* 0.32*  --  <0.30* <0.30*  VANCOTROUGH  --    < >  --  15  --   --   --   --  18  --   --    < > = values in this interval not displayed.     CrCl cannot be calculated (This lab value cannot be used to calculate CrCl because it is not a number: <0.30).    No Known Allergies  Antimicrobials this admission: Cipro 1/25 >>1/26  Doxy 1/23 >>1/31 Cefadroxil 1/26 >>1/31 Vancomycin 1/31>>  Zosyn 1/31>>2/1 Cefepime 2/1 >> Flagyl 2/1 >>  Dose adjustments this admission: 2/5 VT 7 on 750 mg/12h >> increase to 1g IV every 8 hours 2/6 VT 15 on 1g/8h >> continue current dose 2/9 VT = 18 on 1 g/8h . Level drawn @ 8 hours. Some accumulation noted  Microbiology results: 1/28 CDiff >> neg 1/30 BCx >> NGf 2/2 RCx (TA) >> candida albicans 2/6 RCx (TA) >>  Thank you for  allowing pharmacy to be a part of this patient's care.  Vaughan Basta BS, PharmD, BCPS Clinical Pharmacist 08/28/2022 10:20 AM  Contact: [phone] after 3 PM  "Be curious, not judgmental..." -Jamal Maes

## 2022-08-29 DIAGNOSIS — Z930 Tracheostomy status: Secondary | ICD-10-CM | POA: Diagnosis not present

## 2022-08-29 DIAGNOSIS — T8130XA Disruption of wound, unspecified, initial encounter: Secondary | ICD-10-CM | POA: Diagnosis not present

## 2022-08-29 DIAGNOSIS — R609 Edema, unspecified: Secondary | ICD-10-CM

## 2022-08-29 DIAGNOSIS — E871 Hypo-osmolality and hyponatremia: Secondary | ICD-10-CM

## 2022-08-29 LAB — BASIC METABOLIC PANEL
Anion gap: 10 (ref 5–15)
BUN: 9 mg/dL (ref 8–23)
CO2: 28 mmol/L (ref 22–32)
Calcium: 7.7 mg/dL — ABNORMAL LOW (ref 8.9–10.3)
Chloride: 87 mmol/L — ABNORMAL LOW (ref 98–111)
Creatinine, Ser: <0.3 mg/dL — ABNORMAL LOW (ref 0.44–1.00)
Glucose, Bld: 325 mg/dL — ABNORMAL HIGH (ref 70–99)
Potassium: 3.4 mmol/L — ABNORMAL LOW (ref 3.5–5.1)
Sodium: 125 mmol/L — ABNORMAL LOW (ref 135–145)

## 2022-08-29 LAB — CBC
HCT: 26.4 % — ABNORMAL LOW (ref 36.0–46.0)
Hemoglobin: 8.8 g/dL — ABNORMAL LOW (ref 12.0–15.0)
MCH: 33.1 pg (ref 26.0–34.0)
MCHC: 33.3 g/dL (ref 30.0–36.0)
MCV: 99.2 fL (ref 80.0–100.0)
Platelets: 548 10*3/uL — ABNORMAL HIGH (ref 150–400)
RBC: 2.66 MIL/uL — ABNORMAL LOW (ref 3.87–5.11)
RDW: 19.9 % — ABNORMAL HIGH (ref 11.5–15.5)
WBC: 9.3 10*3/uL (ref 4.0–10.5)
nRBC: 0 % (ref 0.0–0.2)

## 2022-08-29 LAB — GLUCOSE, CAPILLARY
Glucose-Capillary: 114 mg/dL — ABNORMAL HIGH (ref 70–99)
Glucose-Capillary: 160 mg/dL — ABNORMAL HIGH (ref 70–99)
Glucose-Capillary: 178 mg/dL — ABNORMAL HIGH (ref 70–99)
Glucose-Capillary: 132 mg/dL — ABNORMAL HIGH (ref 70–99)
Glucose-Capillary: 124 mg/dL — ABNORMAL HIGH (ref 70–99)
Glucose-Capillary: 115 mg/dL — ABNORMAL HIGH (ref 70–99)

## 2022-08-29 MED ORDER — FUROSEMIDE 10 MG/ML IJ SOLN
40.0000 mg | Freq: Once | INTRAMUSCULAR | Status: AC
  Administered 2022-08-29: 40 mg via INTRAVENOUS
  Filled 2022-08-29: qty 4

## 2022-08-29 MED ORDER — POTASSIUM CHLORIDE 20 MEQ PO PACK
40.0000 meq | PACK | Freq: Four times a day (QID) | ORAL | Status: AC
  Administered 2022-08-29 (×2): 40 meq
  Filled 2022-08-29 (×2): qty 2

## 2022-08-29 MED ORDER — POTASSIUM CHLORIDE 20 MEQ PO PACK
60.0000 meq | PACK | Freq: Once | ORAL | Status: AC
  Administered 2022-08-29: 60 meq
  Filled 2022-08-29: qty 3

## 2022-08-29 MED ORDER — SODIUM CHLORIDE 0.9 % IV SOLN: INTRAVENOUS | Status: DC | PRN

## 2022-08-29 MED ORDER — BUPRENORPHINE 5 MCG/HR TD PTWK
1.0000 | MEDICATED_PATCH | TRANSDERMAL | Status: DC
  Administered 2022-08-29: 1 via TRANSDERMAL
  Filled 2022-08-29: qty 1

## 2022-08-29 NOTE — Progress Notes (Signed)
Speech Language Pathology Treatment: [name] Speaking valve  Patient Details Name: NICHELLE LAWYER MRN: [id number] DOB: 12/12/1955 Today's Date: 08/29/2022 Time: 1020-1050 SLP Time Calculation (min) (ACUTE ONLY): 30 min  Assessment / Plan / Recommendation Clinical Impression  Patient seen by SLP for skilled treatment focused on PMV/voice goals. Her spouse and one of her daughter's Judson Roch) were in the room. Trach cuff had already been deflated and per RN, patient has been tolerating cuff deflation since previous date. Prior to Plum being donned, SpO2 was steady at 95-97% and RR 12-14. SLP donned PMV for total of 30-45 minutes and no significant change in vitals. Initially, patient required cues to initiate voicing and vocal intensity was very low, however as session progressed, her vocal intensity and ability to sustain phonation improved so that she was able to talk to family present in room as well as on speaker phone and be understood without difficulty. She had one cough that resulted in tracheal secretions expectorated and one hoarse sounding, non productive cough. No air trapping/back pressure noted when PMV periodically removed. SLP showed patient's spouse as well as daughter Judson Roch how to donn and doff PMV. PMV signs in room updated from "with SLP only" to "intermittently with staff".    HPI HPI: Patient is a 67 y/o female who presents on 1/22 for revision of complex lumbar wound, removal of left L1-L5 lumbar screws and rod, removal of right L1 screw and revision of L1-L5 posterior instrumented fusion. Current admission has been complicated by encephalopathy which initially seemed 2/2 polypharmacy, then thought possible UTI, and Cefadroxil was added 1/26.  CNS depressing medications were also de-escalated.  Despite these measures, Pt become increasingly encephalopathic. 2/1: Admitted to ICU and intubated. 2/2: MRI brain concerning for meningitis. 2/6: Pt was extubated although re-intubated due  to fatigue and airway protection; trached. PMH includes GERD, HH, DM, HTN, anxiety, left TSA, arthritis. Esophagram in 2021 revealed previous Nissen fundoplication as well as moderate to severe esophageal dysmotility.      SLP Plan  Continue with current plan of care      Recommendations for follow up therapy are one component of a multi-disciplinary discharge planning process, led by the attending physician.  Recommendations may be updated based on patient status, additional functional criteria and insurance authorization.    Recommendations         Patient may use Passy-Muir Speech Valve: Intermittently with supervision PMSV Supervision: Full MD: Please consider changing trach tube to : Cuffless;Smaller size         Oral Care Recommendations: Oral care QID Follow Up Recommendations: Skilled nursing-short term rehab (<3 hours/day) Assistance recommended at discharge: Frequent or constant Supervision/Assistance SLP Visit Diagnosis: Aphonia (R49.1) Plan: Continue with current plan of care           Sonia Baller, MA, CCC-SLP Speech Therapy

## 2022-08-29 NOTE — Progress Notes (Signed)
Inpatient Rehab Admissions Coordinator Note:   Per PT patient was screened for CIR candidacy by Desmin Daleo Danford Bad, CCC-SLP. At this time, pt has not yet attempted transfers. Unsure if pt would be able to tolerate the intensity of CIR. CIR admissions team will follow to monitor progress and participation with therapies. A consult order will be placed if pt appears to be an appropriate candidate.   Gayland Curry, [place], [place] Admissions Coordinator [phone] 08/29/22 4:39 PM

## 2022-08-29 NOTE — Progress Notes (Signed)
Physical Therapy Treatment Patient Details Name: [name] MRN: [id number] DOB: Dec 11, 1955 Today's Date: 08/29/2022   History of Present Illness Patient is a 67 y/o female who presents on 1/22 for revision of complex lumbar wound, removal of left L1-L5 lumbar screws and rod, removal of right L1 screw and revision of L1-L5 posterior instrumented fusion. 1/30 pt febrile and more lethargic in AM, MD ordered CXR. Pt s/p trach on 2/6. PMH includes DM, HTN, anxiety, left TSA, arthritis. Current admission course has been complicated by encephalopathywhich initially seemed 2/2 polypharmacy, then thought possible UTI, and Cefadroxil was added 1/26.  CNS depressing medications were also de-escalated.  Despite these measures, Pt become increasingly encephalopathic.2/1: Admitted to ICU and intubated. 2/2: MR brain concerning for meningitis. 2/6: Pt was extubated although re-intubated due to fatigue and airway protection.    PT Comments    Pt with significant improvement from cognitive and functional stand point. Pt has been able to stay off the ventilator and is tolerating PMSV and talking/engaging with family and staff. Spoke at length with patient regarding her lack of desire to mobilize but her strong desire to return home. Pt aware she needs to mobilize to return home and not return to SNF. Pt reminded t/o session of her goal of going home in which pt responded well and continued to give great effort. Pt tolerated sitting EOB x 15 min progressing from maximal posterior support to very close min guard with support of R UE holding onto rail. Pt talking and engaging with family members while EOB and focused on core strengthening and dynamic reaching. Spoke with family in which they have 24/7 assist, an adjustable bed, w/c, ramp, and BSC. This patient currently demonstrates capability to achieve safe level of function s/p AIR stay to return home with support of family. Pt committed to returning to home.  Acute PT to cont to follow.    Recommendations for follow up therapy are one component of a multi-disciplinary discharge planning process, led by the attending physician.  Recommendations may be updated based on patient status, additional functional criteria and insurance authorization.  Follow Up Recommendations  Acute inpatient rehab (3hours/day)     Assistance Recommended at Discharge Frequent or constant Supervision/Assistance  Patient can return home with the following Two people to help with walking and/or transfers;A lot of help with bathing/dressing/bathroom;Assist for transportation;Help with stairs or ramp for entrance;Direct supervision/assist for medications management;Assistance with cooking/housework   Equipment Recommendations  None recommended by PT    Recommendations for Other Services Other (comment)     Precautions / Restrictions Precautions Precautions: Back;Fall;Other (comment) Precaution Booklet Issued: Yes (comment) Precaution Comments: trach, cortrack, flexiseal, wound vac on back, using PSMV Required Braces or Orthoses: Other Brace Other Brace: Provided bilateral WHO. Size small with added strap length to compensate for edema. Restrictions Weight Bearing Restrictions: No Other Position/Activity Restrictions: external rotation R hip at baseline     Mobility  Bed Mobility Overal bed mobility: Needs Assistance Bed Mobility: Rolling Rolling: +2 for physical assistance, Max assist Sidelying to sit: Max assist, +2 for safety/equipment, HOB elevated Supine to sit: +2 for physical assistance, HOB elevated, Max assist     General bed mobility comments: used helicopter transfer to EOB, pt able to initiate moving LEs towards EOB, limited strength in UEs to assist with transfer, sit to sidelying to return to supine.    Transfers  Ambulation/Gait                   Stairs             Wheelchair Mobility     Modified Rankin (Stroke Patients Only)       Balance Overall balance assessment: Needs assistance Sitting-balance support: Bilateral upper extremity supported, Feet supported Sitting balance-Leahy Scale: Poor Sitting balance - Comments: pt initially dependent on posterior support once EOB, with encouragement and constant verbal cues pt able to use R UE on bed rail and holding onto hand of family member to maintain EOB balance with close min guard from PT, pt tolerate sitting EOB x15 min. Pt asked to lay down but when reminded her goal was to go home she continued to participate     Standing balance-Leahy Scale:  (NT)                              Cognition Arousal/Alertness: Awake/alert Behavior During Therapy: Flat affect Overall Cognitive Status: Impaired/Different from baseline Area of Impairment: Orientation, Attention, Memory, Following commands, Safety/judgement, Problem solving, Awareness                 Orientation Level: Disoriented to (date, once re-oriented able to recall [employer] Bowl sunday) Current Attention Level: Focused Memory: Decreased recall of precautions, Decreased short-term memory Following Commands: Follows one step commands with increased time Safety/Judgement: Decreased awareness of safety, Decreased awareness of deficits Awareness: Intellectual Problem Solving: Slow processing, Decreased initiation, Difficulty sequencing, Requires verbal cues, Requires tactile cues General Comments: pt self limiting stating "I don't want to do this" however with reminders that her goal is home she actively participates and give good effort        Exercises Other Exercises Other Exercises: worked on AA dynamic reaching with L UE to give high fives Other Exercises: worked on pulling self forward with UEs to midline to prevent posterior lean in sitting    General Comments General comments (skin integrity, edema, etc.): VSS, wound vac intact       Pertinent Vitals/Pain Pain Assessment Pain Assessment: 0-10 Pain Score: 10-Worst pain ever Pain Location: back with mobility Pain Descriptors / Indicators: Grimacing, Guarding, Discomfort    Home Living                          Prior Function            PT Goals (current goals can now be found in the care plan section) Acute Rehab PT Goals Patient Stated Goal: go to rehab per family PT Goal Formulation: With patient/family Time For Goal Achievement: 09/07/22 Potential to Achieve Goals: Fair Progress towards PT goals: Progressing toward goals    Frequency    Min 3X/week      PT Plan Discharge plan needs to be updated    Co-evaluation       OT goals addressed during session: Strengthening/ROM      AM-PAC PT "6 Clicks" Mobility   Outcome Measure  Help needed turning from your back to your side while in a flat bed without using bedrails?: Total Help needed moving from lying on your back to sitting on the side of a flat bed without using bedrails?: Total Help needed moving to and from a bed to a chair (including a wheelchair)?: Total Help needed standing up from a chair using your arms (e.g., wheelchair or bedside chair)?: Total  Help needed to walk in hospital room?: Total Help needed climbing 3-5 steps with a railing? : Total 6 Click Score: 6    End of Session Equipment Utilized During Treatment: Oxygen (via TC) Activity Tolerance: Patient tolerated treatment well Patient left: in bed;with call bell/phone within reach;with family/visitor present Nurse Communication: Mobility status PT Visit Diagnosis: Pain;Muscle weakness (generalized) (M62.81) Pain - part of body:  (back)     Time: [id number] PT Time Calculation (min) (ACUTE ONLY): 34 min  Charges:  $Therapeutic Exercise: 8-22 mins $Therapeutic Activity: 8-22 mins                     Kittie Plater, PT, DPT Acute Rehabilitation Services Secure chat preferred Office #:  [phone]    Berline Lopes 08/29/2022, 2:42 PM

## 2022-08-29 NOTE — Evaluation (Signed)
Clinical/Bedside Swallow Evaluation Patient Details  Name: [name] MRN: [id number] Date of Birth: Jun 25, 1956  Today's Date: 08/29/2022 Time: SLP Start Time (ACUTE ONLY): 65 SLP Stop Time (ACUTE ONLY): 1110 SLP Time Calculation (min) (ACUTE ONLY): 20 min  Past Medical History:  Past Medical History:  Diagnosis Date   Anemia    Anxiety    Arthritis    Depression    GERD (gastroesophageal reflux disease)    Heart murmur    "related to VSD"   High cholesterol    History of blood transfusion    "related to OR" (08/19/2016)   History of hiatal hernia    Hypertension    Hyperthyroidism    Mild cognitive impairment 09/06/2018   Paroxysmal ventricular tachycardia (HCC)    Type II diabetes mellitus ([place])    UTI (urinary tract infection)    being treated with Keflex   Ventricular septal defect    Past Surgical History:  Past Surgical History:  Procedure Laterality Date   ABDOMINAL HYSTERECTOMY     BACK SURGERY     CARDIAC CATHETERIZATION N/A 06/23/2015   Procedure: Left Heart Cath and Coronary Angiography;  Surgeon: Larey Dresser, MD;  Location: [facility];  Service: Cardiovascular;  Laterality: N/A;   CARDIAC CATHETERIZATION  1960   "VSD was so small; didn't need repaired"   EXAM UNDER ANESTHESIA WITH MANIPULATION OF HIP Right 06/02/2014   dr Rhona Raider   FRACTURE SURGERY     HERNIA REPAIR     HIP CLOSED REDUCTION Right 06/02/2014   Procedure: CLOSED MANIPULATION HIP;  Surgeon: Hessie Dibble, MD;  Location: [facility];  Service: Orthopedics;  Laterality: Right;   JOINT REPLACEMENT     JOINT REPLACEMENT     POSTERIOR LUMBAR FUSION 4 LEVEL N/A 06/26/2022   Procedure: Lumbar One To Lumbar Five Posterior Instrumented Fusion;  Surgeon: Judith Part, MD;  Location: [facility];  Service: Neurosurgery;  Laterality: N/A;   REFRACTIVE SURGERY Bilateral    SHOULDER ARTHROSCOPY Right    SHOULDER OPEN ROTATOR CUFF REPAIR Right    SPINAL FUSION  1996   "t10 down to my  coccyx   SPINE HARDWARE REMOVAL     TOTAL ABDOMINAL HYSTERECTOMY     TOTAL HIP ARTHROPLASTY Right 05/10/2014   [facility]      by dr Harrell Gave ol[place]   TOTAL KNEE ARTHROPLASTY Left    TOTAL SHOULDER ARTHROPLASTY Left 08/19/2016   Procedure: TOTAL SHOULDER ARTHROPLASTY;  Surgeon: Tania Ade, MD;  Location: [facility];  Service: Orthopedics;  Laterality: Left;  Left total shoulder replacement   HPI:  Patient is a 67 y/o female who presents on 1/22 for revision of complex lumbar wound, removal of left L1-L5 lumbar screws and rod, removal of right L1 screw and revision of L1-L5 posterior instrumented fusion. Current admission has been complicated by encephalopathy which initially seemed 2/2 polypharmacy, then thought possible UTI, and Cefadroxil was added 1/26.  CNS depressing medications were also de-escalated.  Despite these measures, Pt become increasingly encephalopathic. 2/1: Admitted to ICU and intubated. 2/2: MRI brain concerning for meningitis. 2/6: Pt was extubated although re-intubated due to fatigue and airway protection; trached. PMH includes GERD, HH, DM, HTN, anxiety, left TSA, arthritis. Esophagram in 2021 revealed previous Nissen fundoplication as well as moderate to severe esophageal dysmotility.    Assessment / Plan / Recommendation  Clinical Impression  Patient's swallow function was assessed at bedside to determine readiness for objective swallow study. Today she is tolerating PMV  for approximately 45 minutes with no observed changes in vitals. She has been able to achieve a clear voice such that family members on phone as well as RN standing at doorway, can hear and understand her. SLP requested patient complete oral care with toothbrush which she reluctantly did, although after minimal effort she stopped. (She seemed to get irritated when asked to do things that were simplistic) SLP then assessed her swallow with single small ice chips (total of 5). She did exhibit any overt s/s  aspiration or penetration, mastication appeared WNL and swallow initiation appeared timely. SLP did not assess patient with any other PO's to minimize potential aspiration risk. SLP recommending continue NPO status but allow small amounts of ice chips PRN after oral care and with PMV in place. Will proceed with MBS next date to objectively assess swallow function. SLP Visit Diagnosis: Dysphagia, unspecified (R13.10)    Aspiration Risk  Mild aspiration risk;Moderate aspiration risk    Diet Recommendation NPO;Ice chips PRN after oral care        Other  Recommendations Oral Care Recommendations: Oral care QID;Oral care prior to ice chip/H20    Recommendations for follow up therapy are one component of a multi-disciplinary discharge planning process, led by the attending physician.  Recommendations may be updated based on patient status, additional functional criteria and insurance authorization.  Follow up Recommendations Skilled nursing-short term rehab (<3 hours/day)      Assistance Recommended at Discharge Frequent or constant Supervision/Assistance  Functional Status Assessment Patient has had a recent decline in their functional status and demonstrates the ability to make significant improvements in function in a reasonable and predictable amount of time.  Frequency and Duration min 2x/week  2 weeks       Prognosis Prognosis for improved oropharyngeal function: Good      Swallow Study   General Date of Onset: 09/09/22 HPI: Patient is a 67 y/o female who presents on 1/22 for revision of complex lumbar wound, removal of left L1-L5 lumbar screws and rod, removal of right L1 screw and revision of L1-L5 posterior instrumented fusion. Current admission has been complicated by encephalopathy which initially seemed 2/2 polypharmacy, then thought possible UTI, and Cefadroxil was added 1/26.  CNS depressing medications were also de-escalated.  Despite these measures, Pt become increasingly  encephalopathic. 2/1: Admitted to ICU and intubated. 2/2: MRI brain concerning for meningitis. 2/6: Pt was extubated although re-intubated due to fatigue and airway protection; trached. PMH includes GERD, HH, DM, HTN, anxiety, left TSA, arthritis. Esophagram in 2021 revealed previous Nissen fundoplication as well as moderate to severe esophageal dysmotility. Type of Study: Bedside Swallow Evaluation Previous Swallow Assessment: none found Diet Prior to this Study: NPO Temperature Spikes Noted: No Respiratory Status: Trach Collar Trach Size and Type: Cuff;Deflated;#6 History of Recent Intubation: Yes Total duration of intubation (days):  (intubated on 2/1) Date extubated:  (trached 08/24/22) Behavior/Cognition: Alert;Cooperative;Pleasant mood Oral Cavity Assessment: Within Functional Limits Oral Care Completed by SLP: Yes Oral Cavity - Dentition: Adequate natural dentition Vision: Functional for self-feeding Self-Feeding Abilities: Needs assist;Needs set up Patient Positioning: Upright in bed Baseline Vocal Quality: Low vocal intensity Volitional Cough: Strong Volitional Swallow: Able to elicit    Oral/Motor/Sensory Function Overall Oral Motor/Sensory Function: Within functional limits   Ice Chips Ice chips: Within functional limits Presentation: Spoon   Thin Liquid Thin Liquid: Not tested    Nectar Thick Nectar Thick Liquid: Not tested   Honey Thick Honey Thick Liquid: Not tested   Puree Puree:  Not tested   Solid     Solid: Not tested      Sonia Baller, MA, CCC-SLP Speech Therapy

## 2022-08-29 NOTE — Progress Notes (Addendum)
NAME:  [name], MRN:  [id number], DOB:  [birth year], LOS: 84 ADMISSION DATE:  08/09/2022, CONSULTATION DATE:  08/19/2022 REFERRING MD:  Darrick Meigs [facility]), CHIEF COMPLAINT:  Dyspnea, encephalopathy  History of Present Illness:  67 yo F PMH mild cognitive decline, bipolar disorder, recurrent UTIs, scoliosis with prior lumbar fusion who was admitted 1/22 for complex lumbar wound/visible lumbar spine hardware.   Has been admitted twice recently prior to current admission -- first for eval of back pain found to be unstable fracture at prior lumbar fusion site requiring PLIF L1-L5 on 12/9. Discharged to [facility] 12/21.  At SNF, wound dehisced and pt presented back to ED same day and was readmitted 12/21-12/29 (second admission), then dc back to SNF.  Her complex lumbar wound further deteriorated at SNF and she presented back to ED 1/22 with exposed lumbar hardware and was admitted (current admission).  She went to OR 1/22 for revision of L1-L5 fusion, removal of left L1-L5 screws and rod, and removal of right L1 screw, revision of L1-L5 posterior instrumented fusion.  She has been on doxy starting 1/24.  Current admission course has been complicated by encephalopathy which initially seemed 2/2 polypharmacy, then thought possible UTI, and Cefadroxil was added 1/26.  CNS depressing medications were also de-escalated.  Despite these measures, Pt become increasingly encephalopathic.  TRH was consulted 1/30 in this setting. She started spiking low grade temps 1/30 (100.7), and became febrile to 103 on 1/31, at which point abx were changed to vanc and zosyn.  Concomitantly, pt has had progressively worsening dyspnea and tachycardia.  CT lumbar spine did not show any surgical site infection.  [name] and ID were consulted on 2/1 in this setting.  MR brain concerning for ventriculitis or meningitis and continued on empiric treatment.  CTA chest concerning for PNA, continued on metronidazole for aspiration PNA.  Pertinent   Medical History  - Bipolar disorder - Mild cog impairment  - Recurrent UTI - Complex Lumbar spine Hx as noted above - T2DM - GERD  Significant Hospital Events: Including procedures, antibiotic start and stop dates in addition to other pertinent events  12/9 - PLIF for unstable L fracture through prior L spine fusion. Admitted 12/9-12/21, dc to SNF 12/21 12/21 - Readmitted 12/21-12/29 for wound dehiscence and dc back to SNF 1/22 - Readmitted for exposed L hardware; OR for removal of left L1-L5 screws, rod and right L1 screw. 1/22 - Started doxycycline. 1/23 - Encephalopathic 1/25 - Started ciprofloxacin 1/26 - Stopped ciprofloxacin, started cefadroxil for possible UTI  1/30 - New leukocytosis, ongoing confusion, fever; TRH consult 1/31 - Worse fever and confusion; abx change to vanc and zosyn 2/1 - Dyspnea, tachycardia. WBC 17.5.  [name] and ID consults. Abx changed to vanc, cefepime, and metronidazole. Admitted to ICU.  Hypoxemic, intubated.  CTA chest concerning for aspiration PNA. 2/2 - MR brain concerning for meningitis, continue empiric treatment w/ vanc and cefepime  Interim History / Subjective:  Foley put back in overnight.  Objective   Blood pressure 138/80, pulse 78, temperature 99.2 F (37.3 C), temperature source Axillary, resp. rate 14, height 5' (1.524 m), weight 67.6 kg, SpO2 96 %.    FiO2 (%):  [28 %] 28 %   Intake/Output Summary (Last 24 hours) at 08/29/2022 0938 Last data filed at 08/29/2022 0700 Gross per 24 hour  Intake 2382.51 ml  Output 2900 ml  Net -517.49 ml   Filed Weights   08/06/22 1541 08/09/22 0931  Weight: 65.3 kg 67.6 kg  General - more alert Eyes - pupils reactive ENT - trach site clean Cardiac - regular rate/rhythm, no murmur Chest - equal breath sounds b/l, no wheezing or rales Abdomen - soft, non tender, + bowel sounds Extremities - 1+ edema Skin - no rashes Neuro - follows commands  Resolved Problems:  Sepsis, Acute metabolic  encephalopathy 2nd to sepsis, Acute hypoxic respiratory failure, Aspiration pneumonitis, VT likely from hypokalemia  Assessment & Plan:   Meningitis/ventriculitis in 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 - plane for f/u MRI lumbar spine on 2/12 - neurosurgery and wound care following - per ID will need total of 6 weeks abx therapy >> continue vancomycin, flagyl, cefepime  Compromised airway. - S/p tracheostomy 2/06 - trach care - plan to remove trach sutures after 2/13 - goal SpO2 > 92% - f/u CXR intermittently - speech therapy for PM valve and swallow assessment  Hyponatremia. - f/u BMET - continue salt tablets - had very low cortisol level in 2019 >> check AM cortisol level on 2/12  Hypokalemia. - f/u BMET   Anemia of critical illness. - f/u CBC - transfuse for Hb < 7  DM type 2. - SSI   Chronic combined CHF, CAD, HTN. - goal BP normotensive - continue coreg - lasix 40 mg IV x one on 2/11  Urine retention. - continue in/out cath for next 24 hours - foley put back in on 2/11  Hx of Bipolar, mild cognitive deficit. - continue aricept, luvox, neurontin, lamictal, trazodone  Hx of hypothyroidism. - continue synthroid  Moderate malnutrition. - tube feeds  Best Practice (right click and "Reselect all SmartList Selections" daily)   Diet/type: tubefeeds DVT prophylaxis: prophylactic heparin  GI prophylaxis: H2B Lines: Central line Foley:  N/A Code Status:  full code Last date of multidisciplinary goals of care discussion: Updated her daughter's at bedside  Labs:      Latest Ref Rng & Units 08/29/2022    3:55 AM 08/28/2022    4:50 AM 08/27/2022   11:54 PM  CMP  Glucose 70 - 99 mg/dL 325  130  110   BUN 8 - 23 mg/dL 9  7  9   $ Creatinine 0.44 - 1.00 mg/dL <0.30  <0.30  <0.30   Sodium 135 - 145 mmol/L 125  130  129   Potassium 3.5 - 5.1 mmol/L 3.4  3.8  4.0   Chloride 98 - 111 mmol/L 87  91  91   CO2 22 - 32 mmol/L 28  29  27    $ Calcium 8.9 - 10.3 mg/dL 7.7  7.9  8.0        Latest Ref Rng & Units 08/29/2022    3:55 AM 08/28/2022    4:50 AM 08/26/2022    5:00 AM  CBC  WBC 4.0 - 10.5 K/uL 9.3  10.4  11.3   Hemoglobin 12.0 - 15.0 g/dL 8.8  8.9  7.5   Hematocrit 36.0 - 46.0 % 26.4  27.6  22.5   Platelets 150 - 400 K/uL 548  646  548     CBG (last 3)  Recent Labs    08/28/22 2248 08/29/22 0341 08/29/22 0725  GLUCAP 122* 114* 160*   Signature:  Chesley Mires, MD [place] Pager - [phone] or [phone] 08/29/2022, 9:38 AM

## 2022-08-29 NOTE — Progress Notes (Signed)
NEUROSURGERY PROGRESS NOTE  Doing ok. Complains of appropriate back soreness. No numbness, tingling or weakness Incision CDI, wound vac in place Continue therapy   Temp:  [98.3 F (36.8 C)-99.2 F (37.3 C)] 99.2 F (37.3 C) (02/11 0800) Pulse Rate:  [78-98] 78 (02/11 0800) Resp:  [11-19] 14 (02/11 0800) BP: (126-155)/(67-91) 138/80 (02/11 0800) SpO2:  [89 %-99 %] 96 % (02/11 0800) FiO2 (%):  [28 %] 28 % (02/11 0750)   Eleonore Chiquito, NP 08/29/2022 8:49 AM

## 2022-08-30 ENCOUNTER — Inpatient Hospital Stay (HOSPITAL_COMMUNITY): Payer: Medicare Other

## 2022-08-30 DIAGNOSIS — T8130XA Disruption of wound, unspecified, initial encounter: Secondary | ICD-10-CM | POA: Diagnosis not present

## 2022-08-30 LAB — CBC
HCT: 25.9 % — ABNORMAL LOW (ref 36.0–46.0)
Hemoglobin: 8.5 g/dL — ABNORMAL LOW (ref 12.0–15.0)
MCH: 33.5 pg (ref 26.0–34.0)
MCHC: 32.8 g/dL (ref 30.0–36.0)
MCV: 102 fL — ABNORMAL HIGH (ref 80.0–100.0)
Platelets: 500 10*3/uL — ABNORMAL HIGH (ref 150–400)
RBC: 2.54 MIL/uL — ABNORMAL LOW (ref 3.87–5.11)
RDW: 20.5 % — ABNORMAL HIGH (ref 11.5–15.5)
WBC: 8.6 10*3/uL (ref 4.0–10.5)
nRBC: 0 % (ref 0.0–0.2)

## 2022-08-30 LAB — BASIC METABOLIC PANEL
Anion gap: 7 (ref 5–15)
BUN: 7 mg/dL — ABNORMAL LOW (ref 8–23)
CO2: 27 mmol/L (ref 22–32)
Calcium: 7.6 mg/dL — ABNORMAL LOW (ref 8.9–10.3)
Chloride: 98 mmol/L (ref 98–111)
Creatinine, Ser: <0.3 mg/dL — ABNORMAL LOW (ref 0.44–1.00)
Glucose, Bld: 124 mg/dL — ABNORMAL HIGH (ref 70–99)
Potassium: 3.7 mmol/L (ref 3.5–5.1)
Sodium: 132 mmol/L — ABNORMAL LOW (ref 135–145)

## 2022-08-30 LAB — GLUCOSE, CAPILLARY
Glucose-Capillary: 148 mg/dL — ABNORMAL HIGH (ref 70–99)
Glucose-Capillary: 153 mg/dL — ABNORMAL HIGH (ref 70–99)
Glucose-Capillary: 175 mg/dL — ABNORMAL HIGH (ref 70–99)
Glucose-Capillary: 141 mg/dL — ABNORMAL HIGH (ref 70–99)
Glucose-Capillary: 137 mg/dL — ABNORMAL HIGH (ref 70–99)
Glucose-Capillary: 108 mg/dL — ABNORMAL HIGH (ref 70–99)

## 2022-08-30 LAB — MAGNESIUM: Magnesium: 1.7 mg/dL (ref 1.7–2.4)

## 2022-08-30 LAB — CORTISOL: Cortisol, Plasma: 13.8 ug/dL

## 2022-08-30 LAB — VANCOMYCIN, TROUGH: Vancomycin Tr: 20 ug/mL (ref 15–20)

## 2022-08-30 MED ORDER — KETOROLAC TROMETHAMINE 30 MG/ML IJ SOLN
INTRAMUSCULAR | Status: AC
  Filled 2022-08-30: qty 1

## 2022-08-30 MED ORDER — MAGNESIUM SULFATE 2 GM/50ML IV SOLN
2.0000 g | Freq: Once | INTRAVENOUS | Status: AC
  Administered 2022-08-30: 2 g via INTRAVENOUS
  Filled 2022-08-30: qty 50

## 2022-08-30 MED ORDER — KETOROLAC TROMETHAMINE 30 MG/ML IJ SOLN
30.0000 mg | Freq: Once | INTRAMUSCULAR | Status: AC
  Administered 2022-08-30: 30 mg via INTRAVENOUS

## 2022-08-30 MED ORDER — VANCOMYCIN HCL 1250 MG/250ML IV SOLN
1250.0000 mg | Freq: Two times a day (BID) | INTRAVENOUS | Status: DC
  Administered 2022-08-31 – 2022-09-03 (×8): 1250 mg via INTRAVENOUS
  Filled 2022-08-30 (×10): qty 250

## 2022-08-30 NOTE — Progress Notes (Signed)
[facility] Progress Note Patient Name: LYBERTY MOSS DOB: 12/30/1955 MRN: [id number]   Date of Service  08/30/2022  HPI/Events of Note  Magnesium 1.7 Creatinine <0.30  eICU Interventions  Magnesium 1 g IVPB ordered     Intervention Category Intermediate Interventions: Electrolyte abnormality - evaluation and management  Judd Lien 08/30/2022, 5:05 AM

## 2022-08-30 NOTE — Progress Notes (Signed)
Occupational Therapy Treatment Patient Details Name: [name] MRN: [id number] DOB: Sep 27, 1955 Today's Date: 08/30/2022   History of present illness Patient is a 67 y/o female who presents on 1/22 for revision of complex lumbar wound, removal of left L1-L5 lumbar screws and rod, removal of right L1 screw and revision of L1-L5 posterior instrumented fusion. 1/30 pt febrile and more lethargic in AM, MD ordered CXR. Pt s/p trach on 2/6. PMH includes DM, HTN, anxiety, left TSA, arthritis. Current admission course has been complicated by encephalopathywhich initially seemed 2/2 polypharmacy, then thought possible UTI, and Cefadroxil was added 1/26.  CNS depressing medications were also de-escalated.  Despite these measures, Pt become increasingly encephalopathic.2/1: Admitted to ICU and intubated. 2/2: MR brain concerning for meningitis. 2/6: Pt was extubated although re-intubated due to fatigue and airway protection.   OT comments  Patient up in recliner upon entry.  Demonstrating increased AROM of BUEs, remains limited by weakness. Engaged in UE exercises as below, constant cueing required for task attention and recall of task.   Mod assist required for suction oral care, total assist for LB dressing.  +2 max assist to pivot back to EOB.  Pt fatigued but tolerated well.  Patient family very supported and encouraging. Pt reports not wearing resting hand splints, disliking them.  Educated on elevating Ues and moving Ues to reduce edema.  Updated dc plan to AIR.    Recommendations for follow up therapy are one component of a multi-disciplinary discharge planning process, led by the attending physician.  Recommendations may be updated based on patient status, additional functional criteria and insurance authorization.    Follow Up Recommendations  Acute inpatient rehab (3hours/day)     Assistance Recommended at Discharge Frequent or constant Supervision/Assistance  Patient can return home with  the following  Two people to help with walking and/or transfers;Two people to help with bathing/dressing/bathroom;Assist for transportation;Help with stairs or ramp for entrance;Direct supervision/assist for medications management;Direct supervision/assist for financial management;Assistance with cooking/housework   Equipment Recommendations  Other (comment) (defer)    Recommendations for Other Services Rehab consult    Precautions / Restrictions Precautions Precautions: Back;Fall;Other (comment) Precaution Booklet Issued: Yes (comment) Precaution Comments: trach, cortrack, flexiseal, wound vac on back, using PSMV Restrictions Weight Bearing Restrictions: No Other Position/Activity Restrictions: external rotation R hip at baseline       Mobility Bed Mobility Overal bed mobility: Needs Assistance Bed Mobility: Sit to Supine       Sit to supine: Total assist, +2 for physical assistance, +2 for safety/equipment   General bed mobility comments: returned to supine using modified helicopter technique with total assist +2    Transfers Overall transfer level: Needs assistance Equipment used: 2 person hand held assist Transfers: Sit to/from Stand, Bed to chair/wheelchair/BSC Sit to Stand: Max assist, +2 physical assistance, +2 safety/equipment Stand pivot transfers: Max assist, +2 safety/equipment, +2 physical assistance         General transfer comment: +2 max assist using bed pad to come into standing pivoting to recliner with max assist +2     Balance Overall balance assessment: Needs assistance Sitting-balance support: Bilateral upper extremity supported, Feet supported Sitting balance-Leahy Scale: Poor Sitting balance - Comments: relies on back support, unsupported needing assist in recliner due to fatigue   Standing balance support: Bilateral upper extremity supported, During functional activity Standing balance-Leahy Scale: Poor Standing balance comment: max +2  ADL either performed or assessed with clinical judgement   ADL Overall ADL's : Needs assistance/impaired     Grooming: Moderate assistance;Oral care Grooming Details (indicate cue type and reason): suction oral care with RUE, required setup and proximal support with cueing for task attention             Lower Body Dressing: Total assistance;+2 for physical assistance;+2 for safety/equipment;Sit to/from stand   Toilet Transfer: +2 for physical assistance;+2 for safety/equipment;Stand-pivot;Maximal assistance Toilet Transfer Details (indicate cue type and reason): simulated from recliner to EOB         Functional mobility during ADLs: Maximal assistance;+2 for physical assistance;+2 for safety/equipment      Extremity/Trunk Assessment Upper Extremity Assessment Upper Extremity Assessment: RUE deficits/detail;LUE deficits/detail RUE Deficits / Details: improved AROM today but limited by weakness, AAROM for increased range and functional use required RUE Coordination: decreased gross motor;decreased fine motor LUE Deficits / Details: mild edema noted, improved AROM today but limited by weakness, AAROM for increased range and functional use required LUE Coordination: decreased fine motor;decreased gross motor   Lower Extremity Assessment Lower Extremity Assessment: Defer to PT evaluation        Vision       Perception     Praxis      Cognition Arousal/Alertness: Awake/alert Behavior During Therapy: Flat affect Overall Cognitive Status: Impaired/Different from baseline Area of Impairment: Attention, Memory, Following commands, Safety/judgement, Problem solving, Awareness                   Current Attention Level: Focused Memory: Decreased recall of precautions, Decreased short-term memory Following Commands: Follows one step commands inconsistently, Follows one step commands with increased time Safety/Judgement: Decreased  awareness of safety, Decreased awareness of deficits Awareness: Intellectual Problem Solving: Slow processing, Decreased initiation, Difficulty sequencing, Requires verbal cues, Requires tactile cues General Comments: pt repeating herself frequently, poor recall and attention to tasks.  She is inconsistent with simple commands without multimodal cueing.  Requires encouragement at times.        Exercises Exercises: General Upper Extremity General Exercises - Upper Extremity Shoulder Flexion: AROM, AAROM, 5 reps, Both, Seated Elbow Flexion: AROM, AAROM, Both, 5 reps, Seated Elbow Extension: AROM, AAROM, 5 reps, Both, Seated Wrist Flexion: AROM, AAROM, Both, 5 reps, Seated Wrist Extension: AAROM, AROM, Both, 5 reps, Seated Digit Composite Flexion: AROM, Both, 10 reps, Seated Composite Extension: AROM, Both, 10 reps, Seated    Shoulder Instructions       General Comments VSS, wound vac intact; pt on trach collar 6L 28%.  Pt reports not wearing resting hand splints. Encouraged AROM and elevation.    Pertinent Vitals/ Pain       Pain Assessment Pain Assessment: Faces Faces Pain Scale: Hurts little more Pain Location: back with mobility Pain Descriptors / Indicators: Grimacing, Guarding, Discomfort Pain Intervention(s): Limited activity within patient's tolerance, Monitored during session, Repositioned  Home Living                                          Prior Functioning/Environment              Frequency  Min 3X/week        Progress Toward Goals  OT Goals(current goals can now be found in the care plan section)  Progress towards OT goals: Progressing toward goals  Acute Rehab OT Goals Patient Stated Goal: none stated  OT Goal Formulation: With patient Time For Goal Achievement: 09/07/22 Potential to Achieve Goals: [place]  Plan Discharge plan needs to be updated;Frequency needs to be updated    Co-evaluation                 AM-PAC OT "6  Clicks" Daily Activity     Outcome Measure   Help from another person eating meals?: Total Help from another person taking care of personal grooming?: A Lot Help from another person toileting, which includes using toliet, bedpan, or urinal?: Total Help from another person bathing (including washing, rinsing, drying)?: A Lot Help from another person to put on and taking off regular upper body clothing?: A Lot Help from another person to put on and taking off regular lower body clothing?: Total 6 Click Score: 9    End of Session Equipment Utilized During Treatment: Gait belt;Oxygen (via trach collar)  OT Visit Diagnosis: Muscle weakness (generalized) (M62.81);Repeated falls (R29.6)   Activity Tolerance Patient tolerated treatment well   Patient Left in bed;with call bell/phone within reach;with nursing/sitter in room;with family/visitor present   Nurse Communication Mobility status        Time: 1101-1137 OT Time Calculation (min): 36 min  Charges: OT General Charges $OT Visit: 1 Visit OT Treatments $Self Care/Home Management : 8-22 mins $Therapeutic Exercise: 8-22 mins  [name], OT Acute Rehabilitation Services Office [phone]   Delight Stare 08/30/2022, 1:09 PM

## 2022-08-30 NOTE — Progress Notes (Addendum)
Modified Barium Swallow Study  Patient Details  Name: [name] MRN: [id number] Date of Birth: 07-11-1956  Today's Date: 08/30/2022  Modified Barium Swallow completed.  Full report located under Chart Review in the Imaging Section.  History of Present Illness Patient is a 67 y/o female who presents on 1/22 for revision of complex lumbar wound, removal of left L1-L5 lumbar screws and rod, removal of right L1 screw and revision of L1-L5 posterior instrumented fusion. Current admission has been complicated by encephalopathy which initially seemed 2/2 polypharmacy, then thought possible UTI, and Cefadroxil was added 1/26.  CNS depressing medications were also de-escalated.  Despite these measures, Pt become increasingly encephalopathic. 2/1: Admitted to ICU and intubated. 2/2: MRI brain concerning for meningitis. 2/6: Pt was extubated although re-intubated due to fatigue and airway protection; trached. PMH includes GERD, HH, DM, HTN, anxiety, left TSA, arthritis. Esophagram in 2021 revealed previous Nissen fundoplication as well as moderate to severe esophageal dysmotility.   Clinical Impression Pt demonstrates swallow within functional limits for age with trace to mild differences that do not impact safety with PO. Pt does have cortrak in place. Also has PMSV in place for exam. Differences in swallow function include initiation of hyoid burst past the ramus of the mandible with most thin and nectar trials. Pt has an instance of trace frank penetration of thin while taking pill due to late and slightly incomplete epiglottic deflection. There is trace residue at times in the base of tongue and vallecular region that clear with subsequent swallows. Pill also lodged in vallecular region as well as in the mid esophageal column. Recommend pt consume regular textured solids with thin liquids with PMSV in place, but take pills whole with puree. Noted trace collection of barium in tissue anterior to the  esophagus on image 15 (44/246) and in other trials of liquids. Radiologist reviewed and agreed without further recommendation.   Factors that may increase risk of adverse event in presence of aspiration ([place] 2021): Limited mobility;Frail or deconditioned;Dependence for feeding and/or oral hygiene;Presence of tubes (ETT, trach, NG, etc.)  Swallow Evaluation Recommendations Recommendations: PO diet PO Diet Recommendation: Regular;Thin liquids (Level 0) Liquid Administration via: Cup;Straw Medication Administration: Whole meds with puree Supervision: Patient able to self-feed Swallowing strategies  : Place PMSV during PO intake;Minimize environmental distractions;Slow rate;Small bites/sips Postural changes: Position pt fully upright for meals;Stay upright 30-60 min after meals Oral care recommendations: Oral care BID (2x/day)      Celester Lech, Katherene Ponto 08/30/2022,1:42 PM

## 2022-08-30 NOTE — Progress Notes (Signed)
Physical Therapy Treatment Patient Details Name: [name] MRN: [id number] DOB: [birth year] Today's Date: 08/30/2022   History of Present Illness Patient is a 67 y/o female who presents on 1/22 for revision of complex lumbar wound, removal of left L1-L5 lumbar screws and rod, removal of right L1 screw and revision of L1-L5 posterior instrumented fusion. 1/30 pt febrile and more lethargic in AM, MD ordered CXR. Pt s/p trach on 2/6. PMH includes DM, HTN, anxiety, left TSA, arthritis. Current admission course has been complicated by encephalopathywhich initially seemed 2/2 polypharmacy, then thought possible UTI, and Cefadroxil was added 1/26.  CNS depressing medications were also de-escalated.  Despite these measures, Pt become increasingly encephalopathic.2/1: Admitted to ICU and intubated. 2/2: MR brain concerning for meningitis. 2/6: Pt was extubated although re-intubated due to fatigue and airway protection.    PT Comments    Pt making tremendous progress. Pt able to maintain EOB sitting balance with close min guard majority of time, with minA for rest breaks for 10 min. Pt was able to stand with PT and RN x2 and complete std pvt to chair pvting on L LE as patient's R LE is shorter than the other. Pt remains to have decreased attn span, perseveration on ideas, short term memory deficit, and delayed processing. Pt continues to be in back pain however is very motivated to return home and gives great effort t/o therapy session. Pt was able to sit in chair for 1.5 hours this date. Pt was only amb household distances prior to her fall in November resulting in this back surgery and prolonged hospital course and SNF stay. Pt used w/c for community mobility due to multiple orthopedic surgeries and R LE being shorter than the other. Pt to strongly benefit from AIR as pt demonstrates ability to achieve minA w/c level of function to transition safely home with family. Pt has 24/7 support, ramp entry, w/c,  adjustable bed, and 3n1 commode in place at home. Acute PT to cont to follow.   Recommendations for follow up therapy are one component of a multi-disciplinary discharge planning process, led by the attending physician.  Recommendations may be updated based on patient status, additional functional criteria and insurance authorization.  Follow Up Recommendations  Acute inpatient rehab (3hours/day) Can patient physically be transported by private vehicle: No   Assistance Recommended at Discharge Frequent or constant Supervision/Assistance  Patient can return home with the following Two people to help with walking and/or transfers;A lot of help with bathing/dressing/bathroom;Assist for transportation;Help with stairs or ramp for entrance;Direct supervision/assist for medications management;Assistance with cooking/housework   Equipment Recommendations  None recommended by PT (has needed DME)    Recommendations for Other Services Other (comment)     Precautions / Restrictions Precautions Precautions: Back;Fall;Other (comment) Precaution Booklet Issued: Yes (comment) Precaution Comments: trach, cortrack, flexiseal, wound vac on back, using PSMV Restrictions Weight Bearing Restrictions: No Other Position/Activity Restrictions: external rotation R hip at baseline, pt ambulated household distances PTA, w/c for community     Mobility  Bed Mobility Overal bed mobility: Needs Assistance Bed Mobility: Supine to Sit     Supine to sit: +2 for physical assistance, HOB elevated, Max assist     General bed mobility comments: pt with increased ability to move LEs to EOB and trunk elevation initiation, maxA to come fully upright and to scoot to EOB with feet on the floor    Transfers Overall transfer level: Needs assistance Equipment used: 2 person hand held assist (face to face  transfer with use of bed pad under pt as gait belt not optimal for use with where wound vac is on back) Transfers:  Sit to/from Stand, Bed to chair/wheelchair/BSC Sit to Stand: Max assist, +2 physical assistance, +2 safety/equipment Stand pivot transfers: Max assist, +2 safety/equipment, +2 physical assistance         General transfer comment: +2 max assist using bed pad to come into standing pivoting to recliner with max assist +2, completed 2 sit to stand prior to pvt to Recliner, pt with R LE shorter than the other limiting her ability to WB but was able to tolerate WB on L LE without knee buckling    Ambulation/Gait                   Stairs             Wheelchair Mobility    Modified Rankin (Stroke Patients Only)       Balance Overall balance assessment: Needs assistance Sitting-balance support: Bilateral upper extremity supported, Feet supported Sitting balance-Leahy Scale: Fair Sitting balance - Comments: pt able to maintain EOB seated balance with bilat hands on knees with close min guard for 2 minutes at a time prior to posterior lean. tolerated EOB x 10 min while wound care changed the wound vac dressing on her back   Standing balance support: Bilateral upper extremity supported, During functional activity Standing balance-Leahy Scale: Poor Standing balance comment: dependent on external support                            Cognition Arousal/Alertness: Awake/alert Behavior During Therapy: WFL for tasks assessed/performed Overall Cognitive Status: Impaired/Different from baseline Area of Impairment: Attention, Memory, Following commands, Safety/judgement, Problem solving, Awareness                   Current Attention Level: Focused Memory: Decreased recall of precautions, Decreased short-term memory Following Commands: Follows one step commands with increased time Safety/Judgement: Decreased awareness of safety, Decreased awareness of deficits Awareness: Intellectual Problem Solving: Slow processing, Decreased initiation, Difficulty sequencing,  Requires verbal cues, Requires tactile cues General Comments: pt repeating herself frequently, poor recall and attention to tasks  requiring frequent cues to stay on task        Exercises General Exercises - Lower Extremity Ankle Circles/Pumps: AROM, Both, 10 reps Quad Sets: AAROM, Supine, Both, 5 reps (tactile cues to stay engaged due to poor attn) Gluteal Sets: AROM, Both, 10 reps Short Arc Quad: AROM, 10 reps, Supine, Left    General Comments General comments (skin integrity, edema, etc.): VSS, wound vac change      Pertinent Vitals/Pain Pain Assessment Pain Assessment: 0-10 Pain Score: 10-Worst pain ever Pain Location: back with mobility Pain Descriptors / Indicators: Grimacing, Guarding, Discomfort Pain Intervention(s): Monitored during session    Home Living                          Prior Function            PT Goals (current goals can now be found in the care plan section) Acute Rehab PT Goals Patient Stated Goal: go to rehab then home PT Goal Formulation: With patient/family Time For Goal Achievement: 09/07/22 Potential to Achieve Goals: Fair Progress towards PT goals: Progressing toward goals    Frequency    Min 4X/week      PT Plan Frequency needs to be updated  Co-evaluation              AM-PAC PT "6 Clicks" Mobility   Outcome Measure  Help needed turning from your back to your side while in a flat bed without using bedrails?: Total Help needed moving from lying on your back to sitting on the side of a flat bed without using bedrails?: Total Help needed moving to and from a bed to a chair (including a wheelchair)?: Total Help needed standing up from a chair using your arms (e.g., wheelchair or bedside chair)?: Total Help needed to walk in hospital room?: Total Help needed climbing 3-5 steps with a railing? : Total 6 Click Score: 6    End of Session Equipment Utilized During Treatment: Oxygen (via TC) Activity Tolerance:  Patient tolerated treatment well Patient left: with call bell/phone within reach;with family/visitor present;in chair;with chair alarm set Nurse Communication: Mobility status PT Visit Diagnosis: Pain;Muscle weakness (generalized) (M62.81);Difficulty in walking, not elsewhere classified (R26.2) Pain - Right/Left:  (back) Pain - part of body:  (back)     Time: [id number] PT Time Calculation (min) (ACUTE ONLY): 57 min  Charges:  $Therapeutic Exercise: 23-37 mins $Therapeutic Activity: 23-37 mins                     Kittie Plater, PT, DPT Acute Rehabilitation Services Secure chat preferred Office #: [phone]    Berline Lopes 08/30/2022, 2:57 PM

## 2022-08-30 NOTE — Consult Note (Signed)
[facility] Nurse wound follow up Wound type:surgical, lumbar (photo in chart) Measurement:13 cm suture line  dehiscence along 80% of line with a 0.2 cm opening near distal end.  No depth appreciated.   Patient sat up on edge of bed for therapy. Has been premedicated for pain.  Lurline Idol is cannulated and patient is verbal.  Speaking with daughters at bedside. She is laughing  Wound [id number] red Drainage (amount, consistency, odor) minimal serosanguinous Periwound: edges of wound appear macerated due to foam dressing. Due to nature of NPWT VAC adhesive drape, skin prep is no longer recommended to protect skin.  Will add barrier ring along periwound to protect.  Dressing procedure/placement/frequency:1 piece black foam, barrier ring to periwound  Covered with drape and seal achieved at 125 Mmhg. Change mon.Wed.Fri.   Will follow. Estrellita Ludwig MSN, RN, FNP-BC CWON Wound, Ostomy, Continence Nurse [facility] [phone] Pager [phone]

## 2022-08-30 NOTE — Progress Notes (Signed)
Nutrition Follow-up  DOCUMENTATION CODES:   Non-severe (moderate) malnutrition in context of acute illness/injury  INTERVENTION:   Tube Feeds via Cortrak tube: Glucerna 1.5 at 50 mL/hr (1200 mL per day) Provides 1800 kcal, 99 gm protein, and 912 mL free water daily Continue Multivitamin w/ minerals daily  Encourage good PO intake   NUTRITION DIAGNOSIS:   Moderate Malnutrition related to acute illness as evidenced by energy intake < 75% for > 7 days, mild fat depletion, mild muscle depletion. - Ongoing, being addressed via TF  GOAL:   Patient will meet greater than or equal to 90% of their needs - Being met via TF  MONITOR:   TF tolerance, Weight trends, Vent status, Labs  REASON FOR ASSESSMENT:   Consult Enteral/tube feeding initiation and management  ASSESSMENT:   Pt with hx of GERD, HLD, HTN, and DM type 2 presented to ED for planned revision of lumbar surgery after previous was not healing and had exposed hardware.  02/01 - tx ICU, intubated 02/02 - Cortrak placed; per xray tip in mid distal stomach  02/05 - extubated; reintubated  02/06 - Trach placed  02/12 - MBS, diet advanced to regular, thin liquids  Pt laying in bed, family at bedside. Pt reports that she is having some nausea, but no vomiting. Pt was able to have a diet cola today and has had diet advanced. Discussed RD team monitoring PO intake and weaning TF as appropriate based on PO intake. Family and pt in agreement with plan.   Discussed plan with RN.   Medications reviewed and include: Pepcid, NovoLog SSI, MVI, Sodium Chloride tablet, IV antibiotics  Labs reviewed: Sodium 132 (L), Potassium 3.7, Magnesium 1.7, 24 hr CBGs 115-175  Diet Order:   Diet Order             Diet regular Fluid consistency: Thin  Diet effective now                  EDUCATION NEEDS:   Not appropriate for education at this time  Skin:  Skin Assessment: Reviewed RN Assessment (Stage 2 to the sacrum)  Last BM:   2/12 - Type 7  Height:  Ht Readings from Last 1 Encounters:  08/09/22 5' (1.524 m)   Weight:  Wt Readings from Last 1 Encounters:  08/09/22 67.6 kg   Ideal Body Weight:  45.5 kg  BMI:  Body mass index is 29.1 kg/m.  Estimated Nutritional Needs:  Kcal:  1800-2000 Protein:  90-110 grams Fluid:  1.8L/d   Hermina Barters RD, LDN Clinical Dietitian See Shea [name] for contact information.

## 2022-08-30 NOTE — Progress Notes (Signed)
Neurosurgery Service Progress Note  Subjective: NAE ON, doing well on TC, passed her swallow this morning, trend is definitely in the right direction  Objective: Vitals:   08/30/22 0700 08/30/22 0717 08/30/22 0800 08/30/22 1200  BP: 124/71 124/71 132/79   Pulse: 83 89 94   Resp: 12 13 12   $ Temp:   98.1 F (36.7 C) 97.6 F (36.4 C)  TempSrc:   Oral Oral  SpO2: 97% 97% 96%   Weight:      Height:        Physical Exam: TC with speaking valve, interactive and appears at cognitive baseline, PERRL, gaze conjugate, Fcx4 with deconditioning, wound vac in place  Assessment & Plan: 67 y.o. female s/p revision of complex lumbar wound, removal of left L1-L5 lumbar screws and rod, removal of right L1 screw, revision of L1-L5 posterior instrumented fusion. Complicated post-operative course   -CCM recs -will repeat the MRI brain to follow up on those small areas of possible ventricular debris -in the unit pending MRI findings, will discuss re transfer back to step down tomorrow if she continues to do well    Judith Part, MD

## 2022-08-30 NOTE — Progress Notes (Signed)
Pharmacy Antibiotic Note  [name] is a 67 y.o. 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 Pharmacy has been consulted for Vancomycin + Cefepime dosing.   A Vancomycin trough has accumulated from 15 to 17 to 20 since start.  Result today is therapeutic at 20 mcg/ml which is the top of the desired range (goal of 15-20 mcg/ml to target CNS concentrations). SCr stable at 0.3, UOP 1.8 ml/kg/hr.   Plan: - Change Vancomycin to 1250 mg IV every 12 hours - Will plan to recheck a trough 2/14 to further assess accumulation  - Continue Cefepime 2g IV every 8 hours and Flagyl 500 mg IV every 8 hours - Will continue to follow renal function, culture results, LOT, and antibiotic de-escalation plans   Height: 5' (152.4 cm) Weight:  (bed scale not working) IBW/kg (Calculated) : 45.5  Temp (24hrs), Avg:98.3 F (36.8 C), Min:97.6 F (36.4 C), Max:98.8 F (37.1 C)  Recent Labs  Lab 08/25/22 0410 08/26/22 0500 08/26/22 0731 08/27/22 0102 08/27/22 1946 08/27/22 2354 08/28/22 0450 08/29/22 0355 08/30/22 0358 08/30/22 1139  WBC 16.8* 11.3*  --   --   --   --  10.4 9.3 8.6  --   CREATININE <0.30* <0.30*   < > 0.32*  --  <0.30* <0.30* <0.30* <0.30*  --   VANCOTROUGH  --   --   --   --  18  --   --   --   --  20   < > = values in this interval not displayed.     CrCl cannot be calculated (This lab value cannot be used to calculate CrCl because it is not a number: <0.30).    No Known Allergies  Antimicrobials this admission: Cipro 1/25 >>1/26  Doxy 1/23 >>1/31 Cefadroxil 1/26 >>1/31 Vancomycin 1/31>>  Zosyn 1/31>>2/1 Cefepime 2/1 >> Flagyl 2/1 >>  Dose adjustments this admission: 2/5 VT 7 on 750 mg/12h >> increase to 1g IV every 8 hours 2/6 VT 15 on 1g/8h >> continue current dose 2/9 VT = 18 on 1 g/8h . Level drawn @ 8 hours. Some accumulation noted 2/12 VT = 20 on 1 gm q8hr will change to 1250 mg q12hr  Microbiology  results: 1/28 CDiff >> neg 1/30 BCx >> NGf 2/2 RCx (TA) >> candida albicans 2/6 RCx (TA) >> candida  Thank you for allowing pharmacy to be a part of this patient's care.  Alanda Slim, PharmD, [facility] Clinical Pharmacist Please see AMION for all Pharmacists' Contact Phone Numbers 08/30/2022, 1:05 PM

## 2022-08-30 NOTE — Progress Notes (Signed)
NAME:  [name], MRN:  [id number], DOB:  1955/07/21, LOS: 21 ADMISSION DATE:  08/09/2022, CONSULTATION DATE:  08/19/2022 REFERRING MD:  Darrick Meigs [facility]), CHIEF COMPLAINT:  Dyspnea, encephalopathy  History of Present Illness:  67 yo F PMH mild cognitive decline, bipolar disorder, recurrent UTIs, scoliosis with prior lumbar fusion who was admitted 1/22 for complex lumbar wound/visible lumbar spine hardware.   Has been admitted twice recently prior to current admission -- first for eval of back pain found to be unstable fracture at prior lumbar fusion site requiring PLIF L1-L5 on 12/9. Discharged to [facility] 12/21.  At SNF, wound dehisced and pt presented back to ED same day and was readmitted 12/21-12/29 (second admission), then dc back to SNF.  Her complex lumbar wound further deteriorated at SNF and she presented back to ED 1/22 with exposed lumbar hardware and was admitted (current admission).  She went to OR 1/22 for revision of L1-L5 fusion, removal of left L1-L5 screws and rod, and removal of right L1 screw, revision of L1-L5 posterior instrumented fusion.  She has been on doxy starting 1/24.  Current admission course has been complicated by encephalopathy which initially seemed 2/2 polypharmacy, then thought possible UTI, and Cefadroxil was added 1/26.  CNS depressing medications were also de-escalated.  Despite these measures, Pt become increasingly encephalopathic.  TRH was consulted 1/30 in this setting. She started spiking low grade temps 1/30 (100.7), and became febrile to 103 on 1/31, at which point abx were changed to vanc and zosyn.  Concomitantly, pt has had progressively worsening dyspnea and tachycardia.  CT lumbar spine did not show any surgical site infection.  [name] and ID were consulted on 2/1 in this setting.  MR brain concerning for ventriculitis or meningitis and continued on empiric treatment.  CTA chest concerning for PNA, continued on metronidazole for aspiration PNA.  Pertinent   Medical History  - Bipolar disorder - Mild cog impairment  - Recurrent UTI - Complex Lumbar spine Hx as noted above - T2DM - GERD  Significant Hospital Events: Including procedures, antibiotic start and stop dates in addition to other pertinent events  12/9 - PLIF for unstable L fracture through prior L spine fusion. Admitted 12/9-12/21, dc to SNF 12/21 12/21 - Readmitted 12/21-12/29 for wound dehiscence and dc back to SNF 1/22 - Readmitted for exposed L hardware; OR for removal of left L1-L5 screws, rod and right L1 screw. 1/22 - Started doxycycline. 1/23 - Encephalopathic 1/25 - Started ciprofloxacin 1/26 - Stopped ciprofloxacin, started cefadroxil for possible UTI  1/30 - New leukocytosis, ongoing confusion, fever; TRH consult 1/31 - Worse fever and confusion; abx change to vanc and zosyn 2/1 - Dyspnea, tachycardia. WBC 17.5.  [name] and ID consults. Abx changed to vanc, cefepime, and metronidazole. Admitted to ICU.  Hypoxemic, intubated.  CTA chest concerning for aspiration PNA. 2/2 - MR brain concerning for meningitis, continue empiric treatment w/ vanc and cefepime 2/6 tracheostomy [place]/RB  Interim History / Subjective:   Tolerating TCT   Objective   Blood pressure 132/79, pulse 94, temperature 98.1 F (36.7 C), temperature source Oral, resp. rate 12, height 5' (1.524 m), weight 67.6 kg, SpO2 96 %.    FiO2 (%):  [28 %] 28 %   Intake/Output Summary (Last 24 hours) at 08/30/2022 0812 Last data filed at 08/30/2022 0800 Gross per 24 hour  Intake 2762.37 ml  Output 3420 ml  Net -657.63 ml   Filed Weights   08/06/22 1541 08/09/22 0931  Weight: 65.3 kg  67.6 kg     General - alert, following commands  Eyes - tracking  ENT - minimal secretion from trach  Cardiac - RRR, s1 s2  Chest - BL rhonchi Abdomen - soft nt nd  Extremities - trace dependent edema  Skin - no rash  Neuro - follows basic commands, weak   Resolved Problems:  Sepsis, Acute metabolic encephalopathy 2nd  to sepsis, Acute hypoxic respiratory failure, Aspiration pneumonitis, VT likely from hypokalemia  Assessment & Plan:   Meningitis/ventriculitis in 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 P: Follow up imaging or repeat MRI per NeuroSx  Per ID will need 6 weeks abx coverage, she has a PICC  - remains on vanco cefe, flagyl  Compromised airway. - S/p tracheostomy 2/06 P: Continue routine trach care Suture removal on 2/13 Continue to work with SLP  ?pmv this week  Hyponatremia. - f/u BMET - continue salt tabs   Hypokalemia. - follow up and replete as needed goal >4   Anemia of critical illness. - transfuse for active bleeding or hgb<7   DM type 2. - SSI with CBGs    Chronic combined CHF, CAD, HTN. - continue coreg  - prn lasix for volume management   Urine retention. - foley put back in on 2/11 - will need to retry with out foley in future   Hx of Bipolar, mild cognitive deficit. - continue following med: aricept, luvox, neurontin, lamictal, trazodone  Hx of hypothyroidism. - continue synthroid   Moderate malnutrition. - remains on tube feeds   Debility - goal up in chair today  - PT/OT much appreciated    Best Practice (right click and "Reselect all SmartList Selections" daily)   Diet/type: tubefeeds DVT prophylaxis: prophylactic heparin  GI prophylaxis: H2B Lines: Central line Foley:  N/A Code Status:  full code Last date of multidisciplinary goals of care discussion: I updated patients daughters at bedside this morning   Labs:      Latest Ref Rng & Units 08/30/2022    3:58 AM 08/29/2022    3:55 AM 08/28/2022    4:50 AM  CMP  Glucose 70 - 99 mg/dL 124  325  130   BUN 8 - 23 mg/dL 7  9  7   $ Creatinine 0.44 - 1.00 mg/dL <0.30  <0.30  <0.30   Sodium 135 - 145 mmol/L 132  125  130   Potassium 3.5 - 5.1 mmol/L 3.7  3.4  3.8   Chloride 98 - 111 mmol/L 98  87  91   CO2 22 - 32 mmol/L 27  28  29   $ Calcium 8.9 - 10.3 mg/dL 7.6  7.7   7.9        Latest Ref Rng & Units 08/30/2022    3:58 AM 08/29/2022    3:55 AM 08/28/2022    4:50 AM  CBC  WBC 4.0 - 10.5 K/uL 8.6  9.3  10.4   Hemoglobin 12.0 - 15.0 g/dL 8.5  8.8  8.9   Hematocrit 36.0 - 46.0 % 25.9  26.4  27.6   Platelets 150 - 400 K/uL 500  548  646     CBG (last 3)  Recent Labs    08/29/22 2310 08/30/22 0323 08/30/22 0755  GLUCAP 115* 148* 153*    Garner Nash, DO [facility] 08/30/2022 8:13 AM

## 2022-08-30 NOTE — Progress Notes (Signed)
Inpatient Rehab Admissions Coordinator:  ? ?Per therapy recommendations,  patient was screened for CIR candidacy by Wilmot Quevedo, MS, CCC-SLP. At this time, Pt. Appears to be a a potential candidate for CIR. I will place   order for rehab consult per protocol for full assessment. Please contact me any with questions. ? ?Lukasz Rogus, MS, CCC-SLP ?Rehab Admissions Coordinator  ?[phone] (celll) ?[phone] (office) ? ?

## 2022-08-30 NOTE — Progress Notes (Signed)
Pt with acute and symptomatic desaturation, as low as 63% sustained.  Sats recovered after suctioning, secretions copious but with thin sputum.  Lungs clear in all fields.  She now denies SOB.  Daughter Richarda Osmond at the bedside, discussed this with her.  Night shift RN will be made aware of these events.  MD aware as well.

## 2022-08-30 NOTE — TOC Progression Note (Signed)
Transition of Care [facility]) - Progression Note    Patient Details  Name: [name] MRN: [id number] Date of Birth: 1956-02-14  Transition of Care [facility]) CM/SW Garner, LCSW Phone Number: 08/30/2022, 8:12 AM  Clinical Narrative:    CSW continuing to follow for medical progression. If patient unable to achieve CIR potential will follow for new SNF placement once tracheostomy is 44 days old.    Expected Discharge Plan: [place] Barriers to Discharge: Continued Medical Work up, Ship broker  Expected Discharge Plan and [place] Choice: [place] Living arrangements for the past 2 months: [place] Determinants of Health (SDOH) Interventions SDOH Screenings   Food Insecurity: No Food Insecurity (06/27/2022)  Housing: Low Risk  (06/27/2022)  Transportation Needs: No Transportation Needs (06/27/2022)  Utilities: Not At Risk (06/27/2022)  Tobacco Use: High Risk (08/09/2022)    Readmission Risk Interventions    07/14/2022    2:53 PM  Readmission Risk Prevention Plan  Transportation Screening Complete  PCP or Specialist Appt within 5-7 Days Complete  Home Care Screening Complete  Medication Review (RN CM) Complete

## 2022-08-31 ENCOUNTER — Inpatient Hospital Stay (HOSPITAL_COMMUNITY): Payer: Medicare Other

## 2022-08-31 DIAGNOSIS — G009 Bacterial meningitis, unspecified: Secondary | ICD-10-CM | POA: Diagnosis not present

## 2022-08-31 DIAGNOSIS — T8130XA Disruption of wound, unspecified, initial encounter: Secondary | ICD-10-CM | POA: Diagnosis not present

## 2022-08-31 DIAGNOSIS — Z930 Tracheostomy status: Secondary | ICD-10-CM | POA: Diagnosis not present

## 2022-08-31 LAB — CBC WITH DIFFERENTIAL/PLATELET
Abs Immature Granulocytes: 0.11 10*3/uL — ABNORMAL HIGH (ref 0.00–0.07)
Basophils Absolute: 0.1 10*3/uL (ref 0.0–0.1)
Basophils Relative: 1 %
Eosinophils Absolute: 0.1 10*3/uL (ref 0.0–0.5)
Eosinophils Relative: 1 %
HCT: 27.1 % — ABNORMAL LOW (ref 36.0–46.0)
Hemoglobin: 8.6 g/dL — ABNORMAL LOW (ref 12.0–15.0)
Immature Granulocytes: 1 %
Lymphocytes Relative: 15 %
Lymphs Abs: 1.3 10*3/uL (ref 0.7–4.0)
MCH: 32.7 pg (ref 26.0–34.0)
MCHC: 31.7 g/dL (ref 30.0–36.0)
MCV: 103 fL — ABNORMAL HIGH (ref 80.0–100.0)
Monocytes Absolute: 1 10*3/uL (ref 0.1–1.0)
Monocytes Relative: 11 %
Neutro Abs: 6.3 10*3/uL (ref 1.7–7.7)
Neutrophils Relative %: 71 %
Platelets: 588 10*3/uL — ABNORMAL HIGH (ref 150–400)
RBC: 2.63 MIL/uL — ABNORMAL LOW (ref 3.87–5.11)
RDW: 20.1 % — ABNORMAL HIGH (ref 11.5–15.5)
WBC: 8.8 10*3/uL (ref 4.0–10.5)
nRBC: 0 % (ref 0.0–0.2)

## 2022-08-31 LAB — COMPREHENSIVE METABOLIC PANEL
ALT: 13 U/L (ref 0–44)
AST: 21 U/L (ref 15–41)
Albumin: 2 g/dL — ABNORMAL LOW (ref 3.5–5.0)
Alkaline Phosphatase: 60 U/L (ref 38–126)
Anion gap: 10 (ref 5–15)
BUN: 9 mg/dL (ref 8–23)
CO2: 26 mmol/L (ref 22–32)
Calcium: 8.1 mg/dL — ABNORMAL LOW (ref 8.9–10.3)
Chloride: 94 mmol/L — ABNORMAL LOW (ref 98–111)
Creatinine, Ser: <0.3 mg/dL — ABNORMAL LOW (ref 0.44–1.00)
Glucose, Bld: 109 mg/dL — ABNORMAL HIGH (ref 70–99)
Potassium: 3.7 mmol/L (ref 3.5–5.1)
Sodium: 130 mmol/L — ABNORMAL LOW (ref 135–145)
Total Bilirubin: 0.5 mg/dL (ref 0.3–1.2)
Total Protein: 5 g/dL — ABNORMAL LOW (ref 6.5–8.1)

## 2022-08-31 LAB — GLUCOSE, CAPILLARY
Glucose-Capillary: 102 mg/dL — ABNORMAL HIGH (ref 70–99)
Glucose-Capillary: 129 mg/dL — ABNORMAL HIGH (ref 70–99)
Glucose-Capillary: 166 mg/dL — ABNORMAL HIGH (ref 70–99)
Glucose-Capillary: 105 mg/dL — ABNORMAL HIGH (ref 70–99)
Glucose-Capillary: 113 mg/dL — ABNORMAL HIGH (ref 70–99)
Glucose-Capillary: 123 mg/dL — ABNORMAL HIGH (ref 70–99)

## 2022-08-31 LAB — PHOSPHORUS: Phosphorus: 4.4 mg/dL (ref 2.5–4.6)

## 2022-08-31 LAB — MAGNESIUM: Magnesium: 1.8 mg/dL (ref 1.7–2.4)

## 2022-08-31 MED ORDER — ORAL CARE MOUTH RINSE: 15.0000 mL | OROMUCOSAL | Status: DC | PRN

## 2022-08-31 MED ORDER — MAGNESIUM SULFATE 2 GM/50ML IV SOLN
2.0000 g | Freq: Once | INTRAVENOUS | Status: AC
  Administered 2022-08-31: 2 g via INTRAVENOUS
  Filled 2022-08-31: qty 50

## 2022-08-31 MED ORDER — POTASSIUM CHLORIDE CRYS ER 20 MEQ PO TBCR
40.0000 meq | EXTENDED_RELEASE_TABLET | Freq: Once | ORAL | Status: AC
  Administered 2022-08-31: 40 meq via ORAL
  Filled 2022-08-31: qty 2

## 2022-08-31 MED ORDER — ORAL CARE MOUTH RINSE
15.0000 mL | OROMUCOSAL | Status: DC
  Administered 2022-08-31 – 2022-09-03 (×15): 15 mL via OROMUCOSAL

## 2022-08-31 MED ORDER — BUPRENORPHINE 5 MCG/HR TD PTWK
1.0000 | MEDICATED_PATCH | TRANSDERMAL | Status: DC
  Administered 2022-08-31: 1 via TRANSDERMAL
  Filled 2022-08-31: qty 1

## 2022-08-31 MED ORDER — GADOBUTROL 1 MMOL/ML IV SOLN
7.0000 mL | Freq: Once | INTRAVENOUS | Status: AC | PRN
  Administered 2022-08-31: 7 mL via INTRAVENOUS

## 2022-08-31 NOTE — Progress Notes (Signed)
  Inpatient Rehabilitation Admissions Coordinator   Met with patient and her two daughters at bedside for rehab assessment. Prior to her fall in November, 2023, patient was ambulatory with cane in her home, used wheelchair for long distances in the community and drove herself. Had been at [facility] since recent surgery in December, but family  prefers CIR rather than SNF level of care. Family can provide 24/7 physical assist as well as hire caregiver supports. Their goal would be that she would be ambulatory without trach or tube feeds at discharge. Noted likely to be on long term IV antibiotics at home. We discussed goals and expectations of a possible CIR admit. I await further progress with therapy before beginning Auth with [facility] for a possible CIR admit. Please refer to Dr Charm Barges consult today for further details.Please call me with any questions.   Danne Baxter, RN, MSN Rehab Admissions Coordinator [phone]

## 2022-08-31 NOTE — Progress Notes (Signed)
Physical Therapy Treatment Patient Details Name: [name] MRN: [id number] DOB: 1956-05-30 Today's Date: 08/31/2022   History of Present Illness Patient is a 67 y/o female who presents on 1/22 for revision of complex lumbar wound, removal of left L1-L5 lumbar screws and rod, removal of right L1 screw and revision of L1-L5 posterior instrumented fusion. 1/30 pt febrile and more lethargic in AM, MD ordered CXR. Pt s/p trach on 2/6. PMH includes DM, HTN, anxiety, left TSA, arthritis. Current admission course has been complicated by encephalopathywhich initially seemed 2/2 polypharmacy, then thought possible UTI, and Cefadroxil was added 1/26.  CNS depressing medications were also de-escalated.  Despite these measures, Pt become increasingly encephalopathic.2/1: Admitted to ICU and intubated. 2/2: MR brain concerning for meningitis. 2/6: Pt was extubated although re-intubated due to fatigue and airway protection.    PT Comments    Patient with limited progress due to pain and fatigue after returning from MRI.  Family present and supportive during session as pt with frequent questions regarding what we are doing and why she cannot take a nap.  She had significantly limited core stability up in [place] and was not safe to attempt transfer to chair due to collapsing anteriorly.  Maxi sky lift not available so deferred up to chair today though did perform (or attempted) sit to stand on [name] x 4.  She is likely better in the morning.  PT will continue to follow and hopeful she can achieve a level appropriate for acute inpatient rehab prior to d/c home with family support.    Recommendations for follow up therapy are one component of a multi-disciplinary discharge planning process, led by the attending physician.  Recommendations may be updated based on patient status, additional functional criteria and insurance authorization.  Follow Up Recommendations  Acute inpatient rehab (3hours/day) Can patient  physically be transported by private vehicle: No   Assistance Recommended at Discharge Frequent or constant Supervision/Assistance  Patient can return home with the following Two people to help with walking and/or transfers;A lot of help with bathing/dressing/bathroom;Assist for transportation;Help with stairs or ramp for entrance;Direct supervision/assist for medications management;Assistance with cooking/housework   Equipment Recommendations  None recommended by PT    Recommendations for Other Services       Precautions / Restrictions Precautions Precautions: Fall;Back Precaution Comments: trach, cortrack, flexiseal, wound vac on back, using PSMV Required Braces or Orthoses: Other Brace Other Brace: prevlon boots and hand splints     Mobility  Bed Mobility Overal bed mobility: Needs Assistance Bed Mobility: Rolling, Sidelying to Sit, Sit to Supine Rolling: +2 for physical assistance, Max assist Sidelying to sit: Max assist, +2 for safety/equipment, HOB elevated Supine to sit: +2 for physical assistance, HOB elevated, Max assist Sit to supine: +2 for physical assistance, Max assist   General bed mobility comments: cues for technique to assist with reaching to rail, assist for legs off bed and to lift trunk, assist for scooting forward to sit and to scoot back prior to supine.  to supine assist for legs and trunk    Transfers Overall transfer level: Needs assistance   Transfers: Sit to/from Stand Sit to Stand: +2 physical assistance, Max assist, From elevated surface           General transfer comment: used bed pad for trials sit to stand to [place]; on third trial able to lift enough with A to get flaps down, but pt unsafe leaning heavily forward to attempt transfer to recliner so stood partly once  more to remove flaps to sit on bed Transfer via Lift Equipment: [name]  Ambulation/Gait                   Stairs             Wheelchair Mobility    Modified  [place] (Stroke Patients Only)       Balance Overall balance assessment: Needs assistance Sitting-balance support: Feet supported Sitting balance-Leahy Scale: Poor Sitting balance - Comments: EOB pt pushing posteriorly with mod A at least for balance, placed pt's hand on bed to see if she would initiate scooting back on bed, but pt reported unable and still needing A for balance Postural control: Posterior lean Standing balance support: Bilateral upper extremity supported Standing balance-Leahy Scale: Zero Standing balance comment: +2 for partial standing attempt                            Cognition Arousal/Alertness: Awake/alert Behavior During Therapy: Anxious Overall Cognitive Status: Impaired/Different from baseline Area of Impairment: Attention, Memory, Following commands, Safety/judgement, Problem solving, Awareness                   Current Attention Level: Focused Memory: Decreased recall of precautions, Decreased short-term memory Following Commands: Follows one step commands with increased time Safety/Judgement: Decreased awareness of safety, Decreased awareness of deficits Awareness: Intellectual Problem Solving: Slow processing, Decreased initiation, Difficulty sequencing, Requires verbal cues, Requires tactile cues General Comments: frequently repeats questions about time, something for pain, what are we doing after previously educated        Exercises General Exercises - Lower Extremity Ankle Circles/Pumps: 5 reps, Both, AAROM, Supine Heel Slides: AAROM, 5 reps, Both    General Comments General comments (skin integrity, edema, etc.): VSS during session on trach collar at 28%      Pertinent Vitals/Pain Pain Assessment Faces Pain Scale: Hurts even more Pain Location: back with mobility Pain Descriptors / Indicators: Grimacing, Guarding, Discomfort Pain Intervention(s): Monitored during session, Repositioned, Premedicated before session,  Patient requesting pain meds-RN notified    Home Living                          Prior Function            PT Goals (current goals can now be found in the care plan section) Progress towards PT goals: Not progressing toward goals - comment    Frequency    Min 4X/week      PT Plan Current plan remains appropriate    Co-evaluation              AM-PAC PT "6 Clicks" Mobility   Outcome Measure  Help needed turning from your back to your side while in a flat bed without using bedrails?: Total Help needed moving from lying on your back to sitting on the side of a flat bed without using bedrails?: Total Help needed moving to and from a bed to a chair (including a wheelchair)?: Total Help needed standing up from a chair using your arms (e.g., wheelchair or bedside chair)?: Total Help needed to walk in hospital room?: Total Help needed climbing 3-5 steps with a railing? : Total 6 Click Score: 6    End of Session Equipment Utilized During Treatment: Oxygen Activity Tolerance: Patient limited by fatigue;Patient limited by pain Patient left: in bed;with call bell/phone within reach;with family/visitor present   PT Visit Diagnosis: Pain;Muscle  weakness (generalized) (M62.81);Difficulty in walking, not elsewhere classified (R26.2) Pain - Right/Left:  (back)     Time: [id number] PT Time Calculation (min) (ACUTE ONLY): 37 min  Charges:  $Therapeutic Activity: 23-37 mins                     Magda Kiel, PT Acute Rehabilitation Services [phone] 08/31/2022    Reginia Naas 08/31/2022, 4:21 PM

## 2022-08-31 NOTE — Consult Note (Addendum)
Physical Medicine and Rehabilitation Consult Reason for Consult: Impaired functional mobility Referring Physician: Zada Finders   HPI: [name] is a 67 y.o. female with a history of L1 L5 PLIF on 06/26/22 for unstable fracture through prior lumbar fusion who was readmitted on 07/08/22 for wound dehiscence. She was discharged to SNF after both admissions. [name]  was admitted again on August 09, 2018 for with wound dehiscence and exposed hardware with unstable lumbar spine fracture at the surgical site.  The patient underwent revision of a complex lumbar wound with removal of left L1, L2, L3, L4, and L5 lumbar screws and rod, removal of right L1 screw, revision of L1-L5 posterior instrumented fusion by Dr. Marcello Moores O's regard.  Patient was noted to be lethargic postoperatively and was diagnosed with a UTI treated with cefadrox.  Pain medication was decreased also.  Patient was also hyponatremic.  Patient was seen by [facility] nurse for placement of VAC on 08/18/2022.  The same day patient was found to be tachypneic and there was concern for early sepsis and pulmonary edema.  Hospitalist were consulted to follow patient.  Abx were started empirically for sepsis.  No obvious source of infection was identified and infectious disease was consulted.  Critical care was also consulted for increased confusion and worsening shortness of breath.  CTA of the chest demonstrated multifocal infiltrates concerning for pneumonia due to aspiration.  Patient was ultimately intubated.  CT of the lumbar spine was unremarkable for acute changes.  MRI of the brain showed a punctate focus of ischemia in the right frontal lobe as well as some changes superior horn of the left lateral ventricle concerning for proteinaceous, purulent debris.  Flagyl was added for aspiration pneumonia as well as CNS coverage.  Blood cultures were negative.  Extubation was attempted on 08/23/2022 but patient failed and had to be reintubated and  tracheostomy was placed the same day.  Patient is currently on vancomycin, cefepime, and Flagyl with the plan of 6 weeks of total antibiotic coverage.  Patient has continued to work with physical, occupational, speech therapy through this time.  PT notes progress over the last week.  Yesterday, she was able to maintain sitting balance with min guard assistance for 10 minutes with rest breaks.  She still demonstrates decreased attention span and short-term memory deficits with delayed processing.  She is able to move in her bed with maximal assistance.  Sit to stand transfers are max assist +2.  Patient is not yet ambulating.   Review of Systems  Constitutional:  Negative for fever.  HENT:  Negative for hearing loss.   Respiratory:  Positive for cough.   Cardiovascular:  Negative for palpitations.  Gastrointestinal:  Negative for nausea.  Genitourinary:  Negative for dysuria.  Musculoskeletal:  Positive for back pain and myalgias.  Neurological:  Positive for weakness and headaches.  Psychiatric/Behavioral:  Positive for memory loss.    Past Medical History:  Diagnosis Date   Anemia    Anxiety    Arthritis    Depression    GERD (gastroesophageal reflux disease)    Heart murmur    "related to VSD"   High cholesterol    History of blood transfusion    "related to OR" (08/19/2016)   History of hiatal hernia    Hypertension    Hyperthyroidism    Mild cognitive impairment 09/06/2018   Paroxysmal ventricular tachycardia (HCC)    Type II diabetes mellitus ([place])    UTI (urinary  tract infection)    being treated with Keflex   Ventricular septal defect    Past Surgical History:  Procedure Laterality Date   ABDOMINAL HYSTERECTOMY     BACK SURGERY     CARDIAC CATHETERIZATION N/A 06/23/2015   Procedure: Left Heart Cath and Coronary Angiography;  Surgeon: Larey Dresser, MD;  Location: [facility];  Service: Cardiovascular;  Laterality: N/A;   CARDIAC CATHETERIZATION  1960   "VSD  was so small; didn't need repaired"   EXAM UNDER ANESTHESIA WITH MANIPULATION OF HIP Right 06/02/2014   dr Rhona Raider   FRACTURE SURGERY     HERNIA REPAIR     HIP CLOSED REDUCTION Right 06/02/2014   Procedure: CLOSED MANIPULATION HIP;  Surgeon: Hessie Dibble, MD;  Location: [place];  Service: Orthopedics;  Laterality: Right;   JOINT REPLACEMENT     JOINT REPLACEMENT     POSTERIOR LUMBAR FUSION 4 LEVEL N/A 06/26/2022   Procedure: Lumbar One To Lumbar Five Posterior Instrumented Fusion;  Surgeon: Judith Part, MD;  Location: [place];  Service: Neurosurgery;  Laterality: N/A;   REFRACTIVE SURGERY Bilateral    SHOULDER ARTHROSCOPY Right    SHOULDER OPEN ROTATOR CUFF REPAIR Right    SPINAL FUSION  1996   "t10 down to my coccyx   SPINE HARDWARE REMOVAL     TOTAL ABDOMINAL HYSTERECTOMY     TOTAL HIP ARTHROPLASTY Right 05/10/2014   [place]      by dr Harrell Gave olcott   TOTAL KNEE ARTHROPLASTY Left    TOTAL SHOULDER ARTHROPLASTY Left 08/19/2016   Procedure: TOTAL SHOULDER ARTHROPLASTY;  Surgeon: Tania Ade, MD;  Location: [place];  Service: Orthopedics;  Laterality: Left;  Left total shoulder replacement   Family History  Problem Relation Age of Onset   Other Mother        alive   Stroke Father 32       deceased   Dementia Father    Chorea Maternal Grandfather    Dementia Maternal Aunt    Dementia Maternal Aunt    Heart attack Other        multiple uncles have died with myocardial infarction   Social History:  reports that she has been smoking cigarettes. She has never used smokeless tobacco. She reports that she does not drink alcohol and does not use drugs. Allergies: No Known Allergies Medications Prior to Admission  Medication Sig Dispense Refill   acetaminophen (TYLENOL) 500 MG tablet Take 1,000 mg by mouth in the morning, at noon, and at bedtime. (0800, 1400 & 2000)     amLODipine (NORVASC) 5 MG tablet Take 5 mg by mouth in the morning. (0730)     carvedilol  (COREG) 6.25 MG tablet Take 6.25 mg by mouth in the morning and at bedtime. (0730 & 2000)     cyclobenzaprine (FLEXERIL) 5 MG tablet Take 5 mg by mouth 3 (three) times daily. (0800, 1300 & 2000)     donepezil (ARICEPT) 10 MG tablet Take 10 mg by mouth daily.     fluvoxaMINE (LUVOX) 100 MG tablet Take 100 mg by mouth at bedtime.     gabapentin (NEURONTIN) 100 MG capsule Take 200 mg by mouth in the morning, at noon, and at bedtime. (0800, 1400 & 2000)     lamoTRIgine (LAMICTAL) 200 MG tablet Take 200 mg by mouth daily.     levothyroxine (SYNTHROID) 88 MCG tablet Take 88 mcg by mouth daily before breakfast.     loperamide (IMODIUM) 2  MG capsule Take 2-4 mg by mouth See admin instructions. Take 1 capsule (2 mg) by mouth once daily scheduled & take 1-2 capsules (2-4 mg) by mouth once daily as needed for additional loose stools.     losartan (COZAAR) 100 MG tablet Take 100 mg by mouth at bedtime.     metFORMIN (GLUCOPHAGE) 500 MG tablet Take 500 mg by mouth in the morning and at bedtime.     Multiple Vitamin (MULTIVITAMIN) tablet Take 1 tablet by mouth daily.       ondansetron (ZOFRAN) 4 MG tablet Take 1 tablet (4 mg total) by mouth every 6 (six) hours as needed for nausea or vomiting. 20 tablet 0   OZEMPIC, 2 MG/DOSE, 8 MG/3ML SOPN Inject 2 mg into the skin every Monday.     pantoprazole (PROTONIX) 40 MG tablet Take 80 mg by mouth at bedtime.     QUEtiapine (SEROQUEL) 300 MG tablet Take 600 mg by mouth at bedtime.     Semaglutide, 1 MG/DOSE, (OZEMPIC, 1 MG/DOSE,) 4 MG/3ML SOPN Inject 2 mg into the skin every Monday.     sodium chloride 1 g tablet Take 1 g by mouth every other day. (0900)     traMADol (ULTRAM) 50 MG tablet Take 25 mg by mouth every 6 (six) hours as needed (PAIN).     traZODone (DESYREL) 100 MG tablet Take 100 mg by mouth at bedtime.     buPROPion (WELLBUTRIN XL) 300 MG 24 hr tablet Take 300 mg by mouth in the morning.  (0730)     nicotine polacrilex (NICORETTE) 2 MG gum Take 2 mg by  mouth daily.      Home: Home Living Family/patient expects to be discharged to:: [place]: Other (Comment) (plan to return to [facility] place to receive continued rehab)  Functional History: Prior Function Prior Level of Function : Needs assist  Cognitive Assist : Mobility (cognitive), ADLs (cognitive) Mobility (Cognitive): Intermittent cues ADLs (Cognitive): Intermittent cues Physical Assist : Mobility (physical), ADLs (physical) Mobility (physical): Bed mobility, Transfers ADLs (physical): Grooming, Bathing, Dressing, IADLs, Toileting, Feeding Mobility Comments: Back to SNF likely, reports doing minimal ambulation with RW and standing. ADLs Comments: needed assist with all BADL tasks Functional Status:  Mobility: Bed Mobility Overal bed mobility: Needs Assistance Bed Mobility: Supine to Sit Rolling: +2 for physical assistance, Max assist Sidelying to sit: Max assist, +2 for safety/equipment, HOB elevated Supine to sit: +2 for physical assistance, HOB elevated, Max assist Sit to supine: Total assist, +2 for physical assistance, +2 for safety/equipment Sit to sidelying: Max assist, +2 for safety/equipment General bed mobility comments: pt with increased ability to move LEs to EOB and trunk elevation initiation, maxA to come fully upright and to scoot to EOB with feet on the floor Transfers Overall transfer level: Needs assistance Equipment used: 2 person hand held assist (face to face transfer with use of bed pad under pt as gait belt not optimal for use with where wound vac is on back) Transfers: Sit to/from Stand, Bed to chair/wheelchair/BSC Sit to Stand: Max assist, +2 physical assistance, +2 safety/equipment Bed to/from chair/wheelchair/BSC transfer type:: Stand pivot Stand pivot transfers: Max assist, +2 safety/equipment, +2 physical assistance  Lateral/Scoot Transfers: Total assist, +2 physical assistance General transfer comment: +2 max  assist using bed pad to come into standing pivoting to recliner with max assist +2, completed 2 sit to stand prior to pvt to Recliner, pt with R LE shorter than the other limiting her ability  to WB but was able to tolerate WB on L LE without knee buckling      ADL: ADL Overall ADL's : Needs assistance/impaired Eating/Feeding: NPO Eating/Feeding Details (indicate cue type and reason): Declined proper set-up of bed for adequate positioning to eat. Only acceptable of the HOB at ~30-40 degrees. Grooming: Moderate assistance, Oral care Grooming Details (indicate cue type and reason): suction oral care with RUE, required setup and proximal support with cueing for task attention Upper Body Bathing: Total assistance, Bed level Lower Body Bathing: Total assistance, Bed level Upper Body Dressing : Total assistance, Bed level Lower Body Dressing: Total assistance, +2 for physical assistance, +2 for safety/equipment, Sit to/from stand Toilet Transfer: +2 for physical assistance, +2 for safety/equipment, Stand-pivot, Maximal assistance Toilet Transfer Details (indicate cue type and reason): simulated from recliner to [place] and Hygiene: Bed level, Total assistance Functional mobility during ADLs: Maximal assistance, +2 for physical assistance, +2 for safety/equipment  Cognition: Cognition Overall Cognitive Status: Impaired/Different from baseline Orientation Level: Intubated/Tracheostomy - Unable to assess Cognition Arousal/Alertness: Awake/alert Behavior During Therapy: WFL for tasks assessed/performed Overall Cognitive Status: Impaired/Different from baseline Area of Impairment: Attention, Memory, Following commands, Safety/judgement, Problem solving, Awareness Orientation Level: Disoriented to (date, once re-oriented able to recall [employer] sunday) Current Attention Level: Focused Memory: Decreased recall of precautions, Decreased short-term memory Following  Commands: Follows one step commands with increased time Safety/Judgement: Decreased awareness of safety, Decreased awareness of deficits Awareness: Intellectual Problem Solving: Slow processing, Decreased initiation, Difficulty sequencing, Requires verbal cues, Requires tactile cues General Comments: pt repeating herself frequently, poor recall and attention to tasks  requiring frequent cues to stay on task Difficult to assess due to: Tracheostomy  Blood pressure (!) 143/81, pulse 92, temperature 98 F (36.7 C), temperature source Oral, resp. rate 15, height 5' (1.524 m), weight 67.6 kg, SpO2 100 %. Physical Exam Constitutional:      General: She is not in acute distress. HENT:     Head: Normocephalic.     Right Ear: External ear normal.     Left Ear: External ear normal.     Nose:     Comments: Cortrak in place Eyes:     Extraocular Movements: Extraocular movements intact.     Pupils: Pupils are equal, round, and reactive to light.  Neck:     Comments: #6 trach in place, cuff down. Raspy whisper when speaking Cardiovascular:     Rate and Rhythm: Normal rate.  Pulmonary:     Effort: Pulmonary effort is normal.  Abdominal:     General: There is no distension.     Palpations: Abdomen is soft.  Genitourinary:    Comments: Rectal tube Musculoskeletal:     Right lower leg: No edema.     Left lower leg: No edema.  Skin:    General: Skin is warm.  Neurological:     Mental Status: She is alert.     Comments: Pt is alert, oriented to "[facility]", follows basic commands with some delay. Unable to phonate around trach but can produce raspy whisper. No focal CN abnl. UE motor 4/5 prox to distal. LE 2- HF, 2 KE and 3+ ADF/APF. Senses pain in all 4 limbs. No abnl resting tone. No obvious limb ataxia  Psychiatric:     Comments: Pt smiling, cooperative     Results for orders placed or performed during the hospital encounter of 08/09/22 (from the past 24 hour(s))  Glucose, capillary      Status:  Abnormal   Collection Time: 08/30/22 11:06 AM  Result Value Ref Range   Glucose-Capillary 175 (H) 70 - 99 mg/dL  Vancomycin, trough     Status: None   Collection Time: 08/30/22 11:39 AM  Result Value Ref Range   Vancomycin Tr 20 15 - 20 ug/mL  Glucose, capillary     Status: Abnormal   Collection Time: 08/30/22  3:23 PM  Result Value Ref Range   Glucose-Capillary 141 (H) 70 - 99 mg/dL  Glucose, capillary     Status: Abnormal   Collection Time: 08/30/22  7:27 PM  Result Value Ref Range   Glucose-Capillary 137 (H) 70 - 99 mg/dL  Glucose, capillary     Status: Abnormal   Collection Time: 08/30/22 11:07 PM  Result Value Ref Range   Glucose-Capillary 108 (H) 70 - 99 mg/dL  Glucose, capillary     Status: Abnormal   Collection Time: 08/31/22  3:26 AM  Result Value Ref Range   Glucose-Capillary 102 (H) 70 - 99 mg/dL  CBC with Differential/Platelet     Status: Abnormal   Collection Time: 08/31/22  5:46 AM  Result Value Ref Range   WBC 8.8 4.0 - 10.5 K/uL   RBC 2.63 (L) 3.87 - 5.11 MIL/uL   Hemoglobin 8.6 (L) 12.0 - 15.0 g/dL   HCT 27.1 (L) 36.0 - 46.0 %   MCV 103.0 (H) 80.0 - 100.0 fL   MCH 32.7 26.0 - 34.0 pg   MCHC 31.7 30.0 - 36.0 g/dL   RDW 20.1 (H) 11.5 - 15.5 %   Platelets 588 (H) 150 - 400 K/uL   nRBC 0.0 0.0 - 0.2 %   Neutrophils Relative % 71 %   Neutro Abs 6.3 1.7 - 7.7 K/uL   Lymphocytes Relative 15 %   Lymphs Abs 1.3 0.7 - 4.0 K/uL   Monocytes Relative 11 %   Monocytes Absolute 1.0 0.1 - 1.0 K/uL   Eosinophils Relative 1 %   Eosinophils Absolute 0.1 0.0 - 0.5 K/uL   Basophils Relative 1 %   Basophils Absolute 0.1 0.0 - 0.1 K/uL   Immature Granulocytes 1 %   Abs Immature Granulocytes 0.11 (H) 0.00 - 0.07 K/uL  Comprehensive metabolic panel     Status: Abnormal   Collection Time: 08/31/22  5:46 AM  Result Value Ref Range   Sodium 130 (L) 135 - 145 mmol/L   Potassium 3.7 3.5 - 5.1 mmol/L   Chloride 94 (L) 98 - 111 mmol/L   CO2 26 22 - 32 mmol/L    Glucose, Bld 109 (H) 70 - 99 mg/dL   BUN 9 8 - 23 mg/dL   Creatinine, Ser <0.30 (L) 0.44 - 1.00 mg/dL   Calcium 8.1 (L) 8.9 - 10.3 mg/dL   Total Protein 5.0 (L) 6.5 - 8.1 g/dL   Albumin 2.0 (L) 3.5 - 5.0 g/dL   AST 21 15 - 41 U/L   ALT 13 0 - 44 U/L   Alkaline Phosphatase 60 38 - 126 U/L   Total Bilirubin 0.5 0.3 - 1.2 mg/dL   GFR, Estimated NOT CALCULATED >60 mL/min   Anion gap 10 5 - 15  Magnesium     Status: None   Collection Time: 08/31/22  5:46 AM  Result Value Ref Range   Magnesium 1.8 1.7 - 2.4 mg/dL  Phosphorus     Status: None   Collection Time: 08/31/22  5:46 AM  Result Value Ref Range   Phosphorus 4.4 2.5 - 4.6 mg/dL  Glucose, capillary     Status: Abnormal   Collection Time: 08/31/22  8:09 AM  Result Value Ref Range   Glucose-Capillary 129 (H) 70 - 99 mg/dL   DG Swallowing Func-Speech Pathology  Result Date: 08/30/2022 Table formatting from the original result was not included. Modified Barium Swallow Study Patient Details Name: TRAMYA HYLTON MRN: [id number] Date of Birth: Nov 11, 1955 Today's Date: 08/30/2022 HPI/PMH: HPI: Patient is a 67 y/o female who presents on 1/22 for revision of complex lumbar wound, removal of left L1-L5 lumbar screws and rod, removal of right L1 screw and revision of L1-L5 posterior instrumented fusion. Current admission has been complicated by encephalopathy which initially seemed 2/2 polypharmacy, then thought possible UTI, and Cefadroxil was added 1/26.  CNS depressing medications were also de-escalated.  Despite these measures, Pt become increasingly encephalopathic. 2/1: Admitted to ICU and intubated. 2/2: MRI brain concerning for meningitis. 2/6: Pt was extubated although re-intubated due to fatigue and airway protection; trached. PMH includes GERD, HH, DM, HTN, anxiety, left TSA, arthritis. Esophagram in 2021 revealed previous Nissen fundoplication as well as moderate to severe esophageal dysmotility. Clinical Impression: Pt demonstrates  swallow within functional limits for age with trace to mild differences that do not impact safety with PO. Pt does have cortrak in place. Also has PMSV in place for exam. Differences in swallow function include initiation of hyoid burst past the ramus of the mandible with most thin and nectar trials. Pt has an instance of trace frank penetration of thin while taking pill due to late and slightly incomplete epiglottic deflection. There is trace residue at times in the base of tongue and vallecular region that clear with subsequent swallows. Pill also lodged in vallecular region as well as in the mid esophageal column. Recommend pt consume regular textured solids with thin liquids with PMSV in place, but take pills whole with puree. Noted trace collection of barium in tissue anterior to the esophagus on image 49 (44/246) and in other trials of liquids. Will report to radiologist for futher review Factors that may increase risk of adverse event in presence of aspiration ([place] 2021): Factors that may increase risk of adverse event in presence of aspiration ([place] 2021): Limited mobility; Frail or deconditioned; Dependence for feeding and/or oral hygiene; Presence of tubes (ETT, trach, NG, etc.) Recommendations/Plan: Swallowing Evaluation Recommendations Swallowing Evaluation Recommendations Recommendations: PO diet PO Diet Recommendation: Regular; Thin liquids (Level 0) Liquid Administration via: Cup; Straw Medication Administration: Whole meds with puree Supervision: Patient able to self-feed Swallowing strategies  : Place PMSV during PO intake; Minimize environmental distractions; Slow rate; Small bites/sips Postural changes: Position pt fully upright for meals; Stay upright 30-60 min after meals Oral care recommendations: Oral care BID (2x/day) Treatment Plan Treatment Plan Treatment recommendations: Therapy as outlined in treatment plan below Follow-up recommendations: Acute inpatient rehab (3  hours/day) Functional status assessment: Patient has had a recent decline in their functional status and demonstrates the ability to make significant improvements in function in a reasonable and predictable amount of time. Treatment frequency: Min 2x/week Treatment duration: 1 week Interventions: Aspiration precaution training; Patient/family education; Diet toleration management by SLP Recommendations Recommendations for follow up therapy are one component of a multi-disciplinary discharge planning process, led by the attending physician.  Recommendations may be updated based on patient status, additional functional criteria and insurance authorization. Assessment: Orofacial Exam: Orofacial Exam Oral Cavity: Oral Hygiene: WFL Oral Cavity - Dentition: Adequate natural dentition Orofacial Anatomy: WFL Oral Motor/Sensory Function:  WFL Anatomy: Anatomy: WFL Thin Liquids: Thin Liquids (Level 0) Thin Liquids : WFL Bolus delivery method: Cup Initiation of swallow : Posterior laryngeal surface of the epiglottis  Mildly Thick Liquids: Mildly thick liquids (Level 2, nectar thick) Mildly thick liquids (Level 2, nectar thick): Impaired Bolus delivery method: Cup Mildly Thick Liquid - Impairment: Oral Impairment; Pharyngeal impairment Lip Closure: Escape from interlabial space or lateral juncture, no extension beyond vermillion border Tongue control during bolus hold: Cohesive bolus between tongue to palatal seal Bolus transport/lingual motion: Brisk tongue motion Oral residue: Trace residue lining oral structures Location of oral residue : Tongue; Palate Initiation of swallow : Valleculae Soft palate elevation: Complete Laryngeal elevation: Complete superior movement of thyroid cartilage with complete approximation of arytenoids to epiglottic petiole Anterior hyoid excursion: Complete Epiglottic movement: Partial Laryngeal vestibule closure: Complete: No air/contrast in laryngeal vestibule Pharyngeal stripping wave : Present -  complete Pharyngeal contraction (A/P view only): Complete Pharyngoesophageal segment opening: Complete distension and complete duration, no obstruction of flow Tongue base retraction: No contrast between tongue base and posterior pharyngeal wall (PPW) Pharyngeal residue: Trace residue within or on pharyngeal structures Location of pharyngeal residue: Valleculae; Pyriform sinuses Penetration/Aspiration Scale (PAS) score: 1.  Material does not enter airway  Moderately Thick Liquids: Moderately thick liquids (Level 3, honey thick) Moderately thick liquids (Level 3, honey thick): Impaired Bolus delivery method: Spoon Moderately Thick Liquid - Impairment: Pharyngeal impairment; Oral Impairment Lip Closure: Interlabial escape, no progression to anterior lip Tongue control during bolus hold: Cohesive bolus between tongue to palatal seal Bolus transport/lingual motion: Brisk tongue motion Oral residue: Complete oral clearance Location of oral residue : N/A Initiation of swallow : Posterior angle of the ramus Soft palate elevation: Complete Laryngeal elevation: Complete superior movement of thyroid cartilage with complete approximation of arytenoids to epiglottic petiole Anterior hyoid excursion: Complete Epiglottic movement: Partial Laryngeal vestibule closure: Complete: No air/contrast in laryngeal vestibule Pharyngeal stripping wave : Present - complete Pharyngeal contraction (A/P view only): N/A Pharyngoesophageal segment opening: Complete distension and complete duration, no obstruction of flow Tongue base retraction: Trace column of contrast or air between tongue base and PPW Pharyngeal residue: Trace residue within or on pharyngeal structures Location of pharyngeal residue: Valleculae; Tongue base Penetration/Aspiration Scale (PAS) score: 1.  Material does not enter airway  Puree: Puree Puree: Impaired Puree - Impairment: Pharyngeal impairment Initiation of swallow: Posterior angle of the ramus Soft palate elevation:  Complete Laryngeal elevation: Complete superior movement of thyroid cartilage with complete approximation of arytenoids to epiglottic petiole Anterior hyoid excursion: Complete Epiglottic movement: Partial Laryngeal vestibule closure: Complete: No air/contrast in laryngeal vestibule Pharyngeal stripping wave : Present - complete Pharyngeal contraction (A/P view only): Complete Pharyngoesophageal segment opening: Complete distension and complete duration, no obstruction of flow Tongue base retraction: Trace column of contrast or air between tongue base and PPW Pharyngeal residue: Trace residue within or on pharyngeal structures Location of pharyngeal residue: Tongue base; Valleculae Penetration/Aspiration Scale (PAS) score: 1.  Material does not enter airway Solid: Solid Solid: Impaired Solid - Impairment: Oral Impairment; Pharyngeal impairment Lip Closure: No labial escape Bolus preparation/mastication: Timely and efficient chewing and mashing Bolus transport/lingual motion: Brisk tongue motion Oral residue: Trace residue lining oral structures Location of oral residue : Tongue; Palate Initiation of swallow: Posterior angle of the ramus Soft palate elevation: Complete Laryngeal elevation: Complete superior movement of thyroid cartilage with complete approximation of arytenoids to epiglottic petiole Anterior hyoid excursion: Complete Epiglottic movement: Partial Laryngeal vestibule closure: Complete: No air/contrast  in laryngeal vestibule Pharyngeal stripping wave : Present - complete Pharyngoesophageal segment opening: Complete distension and complete duration, no obstruction of flow Tongue base retraction: Narrow column of contrast or air between tongue base and PPW Pharyngeal residue: Collection of residue within or on pharyngeal structures Location of pharyngeal residue: Valleculae; Tongue base Penetration/Aspiration Scale (PAS) score: 1.  Material does not enter airway Pill: Pill Pill: Impaired Consistency  administered : thin Pill - Impairment: Oral Impairment; Pharyngeal impairment Lip Closure: Escape from interlabial space or lateral juncture, no extension beyond vermillion border Bolus transport/lingual motion: Brisk tongue motion Oral residue: Complete oral clearance Initiation of swallow : Pyriform sinuses Soft palate elevation: Complete Laryngeal elevation: Complete superior movement of thyroid cartilage with complete approximation of arytenoids to epiglottic petiole Anterior hyoid excursion: Complete Epiglottic movement: Partial Laryngeal vestibule closure: Complete: No air/contrast in laryngeal vestibule Pharyngeal stripping wave : Present - complete Pharyngeal contraction (A/P view only): N/A Pharyngoesophageal segment opening: Complete distension and complete duration, no obstruction of flow Tongue base retraction: Wide column of contrast or air between tongue base and PPW Pharyngeal residue: Majority of contrast within or on pharyngeal structures Location of pharyngeal residue: Valleculae; Pyriform sinuses Penetration/Aspiration Scale (PAS) score: 5.  Material enters airway, CONTACTS cords and not ejected out Compensatory Strategies: Compensatory Strategies Compensatory strategies: No   General Information: Caregiver present: Yes Investment banker, corporate)  Diet Prior to this Study: NPO; Cortrak/Small bore NG tube   Temperature : Normal   Respiratory Status: WFL   Supplemental O2: Trach Collar   No data recorded Behavior/Cognition: Alert; Distractible Self-Feeding Abilities: Dependent for feeding Baseline vocal quality/speech: Normal Volitional Cough: Able to elicit Volitional Swallow: Able to elicit No data recorded Goal Planning: Prognosis for improved oropharyngeal function: Good Barriers to Reach Goals: Cognitive deficits No data recorded Patient/Family Stated Goal: patient wants a soda Consulted and agree with results and recommendations: Patient; Nurse Pain: Pain Assessment Pain Assessment: Faces Pain Score: 10 Faces Pain  Scale: 4 Breathing: 0 Negative Vocalization: 0 Facial Expression: 0 Body Language: 0 Consolability: 0 PAINAD Score: 0 Facial Expression: 0 Body Movements: 0 Muscle Tension: 0 Compliance with ventilator (intubated pts.): N/A Vocalization (extubated pts.): 0 CPOT Total: 0 Pain Location: back with mobility Pain Descriptors / Indicators: Grimacing; Guarding; Discomfort Pain Intervention(s): Limited activity within patient's tolerance; Monitored during session; Repositioned End of Session: Start Time:SLP Start Time (ACUTE ONLY): 1224 Stop Time: SLP Stop Time (ACUTE ONLY): 1246 Time Calculation:SLP Time Calculation (min) (ACUTE ONLY): 22 min Charges: SLP Evaluations $ SLP Speech Visit: 1 Visit SLP Evaluations $BSS Swallow: 1 Procedure $MBS Swallow: 1 Procedure $ SLP Eval Voice Prosthetic Device: Procedure $$ Passy Muir Speaking Valve: yes $Speech Treatment for Individual: 1 Procedure SLP visit diagnosis: SLP Visit Diagnosis: Dysphagia, unspecified (R13.10) Past Medical History: Past Medical History: Diagnosis Date  Anemia   Anxiety   Arthritis   Depression   GERD (gastroesophageal reflux disease)   Heart murmur   "related to VSD"  High cholesterol   History of blood transfusion   "related to OR" (08/19/2016)  History of hiatal hernia   Hypertension   Hyperthyroidism   Mild cognitive impairment 09/06/2018  Paroxysmal ventricular tachycardia (HCC)   Type II diabetes mellitus (HCC)   UTI (urinary tract infection)   being treated with Keflex  Ventricular septal defect  Past Surgical History: Past Surgical History: Procedure Laterality Date  ABDOMINAL HYSTERECTOMY    BACK SURGERY    CARDIAC CATHETERIZATION N/A 06/23/2015  Procedure: Left Heart Cath and Coronary Angiography;  Surgeon: Larey Dresser, MD;  Location: [facility];  Service: Cardiovascular;  Laterality: N/A;  CARDIAC CATHETERIZATION  1960  "VSD was so small; didn't need repaired"  EXAM UNDER ANESTHESIA WITH MANIPULATION OF HIP Right 06/02/2014  dr Rhona Raider   FRACTURE SURGERY    HERNIA REPAIR    HIP CLOSED REDUCTION Right 06/02/2014  Procedure: CLOSED MANIPULATION HIP;  Surgeon: Hessie Dibble, MD;  Location: [place];  Service: Orthopedics;  Laterality: Right;  JOINT REPLACEMENT    JOINT REPLACEMENT    POSTERIOR LUMBAR FUSION 4 LEVEL N/A 06/26/2022  Procedure: Lumbar One To Lumbar Five Posterior Instrumented Fusion;  Surgeon: Judith Part, MD;  Location: [place];  Service: Neurosurgery;  Laterality: N/A;  REFRACTIVE SURGERY Bilateral   SHOULDER ARTHROSCOPY Right   SHOULDER OPEN ROTATOR CUFF REPAIR Right   SPINAL FUSION  1996  "t10 down to my coccyx  SPINE HARDWARE REMOVAL    TOTAL ABDOMINAL HYSTERECTOMY    TOTAL HIP ARTHROPLASTY Right 05/10/2014  [facility]      by dr Harrell Gave olcott  TOTAL KNEE ARTHROPLASTY Left   TOTAL SHOULDER ARTHROPLASTY Left 08/19/2016  Procedure: TOTAL SHOULDER ARTHROPLASTY;  Surgeon: Tania Ade, MD;  Location: [place];  Service: Orthopedics;  Laterality: Left;  Left total shoulder replacement DeBlois, Katherene Ponto 08/30/2022, 1:44 PM   Assessment/Plan: Diagnosis: Recent revision of lumbar spine wound/surgical site complicated by osteomyelitis/infection and meningitis/ventriculitis. Pt also deconditioned.  I spoke with 2 daughters at length today. Pt with a history of multiple surgeries including prior back surgery and multiple joint replacements. Yet, she was still able to ambulate household and shorter community distances, drove, and was independent prior to December. Family plans on bringing her home and providing care needed at discharge. They understand that SNF is not really an option if we admit her to inpatient rehab.    Does the need for close, 24 hr/day medical supervision in concert with the patient's rehab needs make it unreasonable for this patient to be served in a less intensive setting? Yes Co-Morbidities requiring supervision/potential complications: wound care, ID considerations, trach mgt, nutrition, pain  control Due to bladder management, bowel management, safety, skin/wound care, disease management, medication administration, pain management, and patient education, does the patient require 24 hr/day rehab nursing? Yes Does the patient require coordinated care of a physician, rehab nurse, therapy disciplines of PT, OT, SLP to address physical and functional deficits in the context of the above medical diagnosis(es)? Yes Addressing deficits in the following areas: balance, endurance, locomotion, strength, transferring, bowel/bladder control, bathing, dressing, feeding, grooming, toileting, cognition, speech, swallowing, and psychosocial support Can the patient actively participate in an intensive therapy program of at least 3 hrs of therapy per day at least 5 days per week? Yes and Potentially The potential for patient to make measurable gains while on inpatient rehab is excellent Anticipated functional outcomes upon discharge from inpatient rehab are supervision and min assist  with PT, supervision and min assist with OT, modified independent and supervision with SLP. Estimated rehab length of stay to reach the above functional goals is: 20-27 days Anticipated discharge destination: Home Overall Rehab/Functional Prognosis: good  POST ACUTE RECOMMENDATIONS: This patient's condition is appropriate for continued rehabilitative care in the following setting:  CIR (see below) Patient has agreed to participate in recommended program. Yes Note that insurance prior authorization may be required for reimbursement for recommended care.  Comment: Pt is not quite ready yet from a tolerance or medical standpoint for inpatient rehab. She  needs ongoing, daily therapy on acute to improve activity tolerance which will effectively help her "ramp up" to that needed to participate in inpatient rehab.     MEDICAL RECOMMENDATIONS: Consider changing q8 sq heparin to lovenox 20m qd for DVT prophylaxis.   I have  personally performed a face to face diagnostic evaluation of this patient. Additionally, I have examined the patient's medical record including any pertinent labs and radiographic images. If the physician assistant has documented in this note, I have reviewed and edited or otherwise concur with the physician assistant's documentation.   Thanks,  ZMeredith Staggers MD 08/31/2022

## 2022-08-31 NOTE — Progress Notes (Signed)
Neurosurgery Service Progress Note  Subjective: NAE ON, desat overnight reportedly to the 60s, resolved w/ suctioning  Objective: Vitals:   08/31/22 0428 08/31/22 0500 08/31/22 0600 08/31/22 0723  BP:  (!) 143/77 129/72 (!) 143/81  Pulse: 81 84 84 92  Resp: 11 11 11 15  $ Temp:      TempSrc:      SpO2: 99% 100% 100% 100%  Weight:      Height:        Physical Exam: TC with speaking valve, interactive and appears at cognitive baseline, PERRL, gaze conjugate, Fcx4 with deconditioning, wound vac in place  Assessment & Plan: 67 y.o. female s/p revision of complex lumbar wound, removal of left L1-L5 lumbar screws and rod, removal of right L1 screw, revision of L1-L5 posterior instrumented fusion. Complicated post-operative course   -CCM recs -repeat MRI brain today -given the desats overnight, will keep in the unit given she's been on TC   Judith Part, MD

## 2022-08-31 NOTE — Progress Notes (Signed)
Speech Language Pathology Treatment: Dysphagia;Passy Muir Speaking valve  Patient Details Name: [name] MRN: [id number] DOB: [birth year] Today's Date: 08/31/2022 Time: [id number] SLP Time Calculation (min) (ACUTE ONLY): 22 min  Assessment / Plan / Recommendation Clinical Impression  Pt continues to demonstrate improvement with swallowing and wearing the PMV. Cuff was deflated at baseline and valve was not placed upon SLP arrival due to pt sleeping. RN reports pt fell asleep with PMV placed yesterday which resulted in severe coughing and secretions requiring suction. Education was provided to pt's daughters regarding placement of the PMV and reinforced that valve must be doffed when pt falls asleep for safety. Valve was donned and doffed by pt's daughter with SLP supervision. Pt tolerated wearing the valve well for ~20 minutes with no coughing or back pressure noted before asking to rest. Recommend wearing PMV during all waking hours and while eating with full supervision, which family is trained to provide.    While PMV was placed, pt was observed during trials of thin liquids, purees, and solids with no overt s/s of dysphagia or aspiration. Pt stated that she does not have an appetite so has not had much to eat. Expect pt will excel with swallowing safely as appetite returns. Recommend continuing regular diet with thin liquids only when PMV is in place.   Family reports that pt's mentation has been intermittently altered. Suspect that pt will need a speech language evaluation. SLP will f/u next date for cognition.    HPI HPI: Patient is a 67 y/o female who presents on 1/22 for revision of complex lumbar wound, removal of left L1-L5 lumbar screws and rod, removal of right L1 screw and revision of L1-L5 posterior instrumented fusion. Current admission has been complicated by encephalopathy which initially seemed 2/2 polypharmacy, then thought possible UTI, and Cefadroxil was added 1/26.  CNS  depressing medications were also de-escalated.  Despite these measures, Pt become increasingly encephalopathic. 2/1: Admitted to ICU and intubated. 2/2: MRI brain concerning for meningitis. 2/6: Pt was extubated although re-intubated due to fatigue and airway protection; trached. PMH includes GERD, HH, DM, HTN, anxiety, left TSA, arthritis. Esophagram in 2021 revealed previous Nissen fundoplication as well as moderate to severe esophageal dysmotility.      SLP Plan  Goals updated      Recommendations for follow up therapy are one component of a multi-disciplinary discharge planning process, led by the attending physician.  Recommendations may be updated based on patient status, additional functional criteria and insurance authorization.    Recommendations  Diet recommendations: Regular;Thin liquid Liquids provided via: Straw;Cup Medication Administration: Whole meds with liquid Supervision: Staff to assist with self feeding;Trained caregiver to feed patient Compensations: Minimize environmental distractions;Slow rate;Small sips/bites Postural Changes and/or Swallow Maneuvers: Seated upright 90 degrees      Patient may use Passy-Muir Speech Valve: During all therapies with supervision;During all waking hours (remove during sleep);During PO intake/meals;Caregiver trained to provide supervision PMSV Supervision: Full MD: Please consider changing trach tube to : Cuffless;Smaller size         Oral Care Recommendations: Oral care BID Follow Up Recommendations: Acute inpatient rehab (3hours/day) Assistance recommended at discharge: Frequent or constant Supervision/Assistance SLP Visit Diagnosis: Dysphagia, unspecified (R13.10) Plan: Goals updated           Fabio Asa., Student SLP  08/31/2022, 11:26 AM

## 2022-08-31 NOTE — Progress Notes (Signed)
[facility] ADULT ICU REPLACEMENT PROTOCOL   The patient does apply for the [facility] Adult ICU Electrolyte Replacment Protocol based on the criteria listed below:   1.Exclusion criteria: TCTS, ECMO, Dialysis, and Myasthenia Gravis patients 2. Is GFR >/= 30 ml/min? Yes.    Patient's GFR today is not calculated 3. Is SCr </= 2? Yes.   Patient's SCr is <0.30 mg/dL 4. Did SCr increase >/= 0.5 in 24 hours? No. 5.Pt's weight >40kg  Yes.   6. Abnormal electrolyte(s): mag 1.8, potassium 3.7  7. Electrolytes replaced per protocol 8.  Call MD STAT for K+ </= 2.5, Phos </= 1, or Mag </= 1 Physician:  protocol  Darlys Gales 08/31/2022 6:55 AM

## 2022-08-31 NOTE — Progress Notes (Signed)
NAME:  [name], MRN:  [id number], DOB:  August 05, 1955, LOS: 5 ADMISSION DATE:  08/09/2022, CONSULTATION DATE:  08/19/2022 REFERRING MD:  Darrick Meigs [facility]), CHIEF COMPLAINT:  Dyspnea, encephalopathy  History of Present Illness:  67 yo F PMH mild cognitive decline, bipolar disorder, recurrent UTIs, scoliosis with prior lumbar fusion who was admitted 1/22 for complex lumbar wound/visible lumbar spine hardware.   Has been admitted twice recently prior to current admission -- first for eval of back pain found to be unstable fracture at prior lumbar fusion site requiring PLIF L1-L5 on 12/9. Discharged to [facility] 12/21.  At SNF, wound dehisced and pt presented back to ED same day and was readmitted 12/21-12/29 (second admission), then dc back to SNF.  Her complex lumbar wound further deteriorated at SNF and she presented back to ED 1/22 with exposed lumbar hardware and was admitted (current admission).  She went to OR 1/22 for revision of L1-L5 fusion, removal of left L1-L5 screws and rod, and removal of right L1 screw, revision of L1-L5 posterior instrumented fusion.  She has been on doxy starting 1/24.  Current admission course has been complicated by encephalopathy which initially seemed 2/2 polypharmacy, then thought possible UTI, and Cefadroxil was added 1/26.  CNS depressing medications were also de-escalated.  Despite these measures, Pt become increasingly encephalopathic.  TRH was consulted 1/30 in this setting. She started spiking low grade temps 1/30 (100.7), and became febrile to 103 on 1/31, at which point abx were changed to vanc and zosyn.  Concomitantly, pt has had progressively worsening dyspnea and tachycardia.  CT lumbar spine did not show any surgical site infection.  [name] and ID were consulted on 2/1 in this setting.  MR brain concerning for ventriculitis or meningitis and continued on empiric treatment.  CTA chest concerning for PNA, continued on metronidazole for aspiration PNA.  Pertinent   Medical History  - Bipolar disorder - Mild cog impairment  - Recurrent UTI - Complex Lumbar spine Hx as noted above - T2DM - GERD  Significant Hospital Events: Including procedures, antibiotic start and stop dates in addition to other pertinent events  12/9 - PLIF for unstable L fracture through prior L spine fusion. Admitted 12/9-12/21, dc to SNF 12/21 12/21 - Readmitted 12/21-12/29 for wound dehiscence and dc back to SNF 1/22 - Readmitted for exposed L hardware; OR for removal of left L1-L5 screws, rod and right L1 screw. 1/22 - Started doxycycline. 1/23 - Encephalopathic 1/25 - Started ciprofloxacin 1/26 - Stopped ciprofloxacin, started cefadroxil for possible UTI  1/30 - New leukocytosis, ongoing confusion, fever; TRH consult 1/31 - Worse fever and confusion; abx change to vanc and zosyn 2/1 - Dyspnea, tachycardia. WBC 17.5.  [name] and ID consults. Abx changed to vanc, cefepime, and metronidazole. Admitted to ICU.  Hypoxemic, intubated.  CTA chest concerning for aspiration PNA. 2/2 - MR brain concerning for meningitis, continue empiric treatment w/ vanc and cefepime 2/6 tracheostomy /RB  Interim History / Subjective:  Tolerating trach collar ad lib. she did have emesis episode of desaturation PM 2/12 and required suctioning of copious thin secretions.  I/O- 9.3 L total Na 130 Platelets 588 Working with SLP, approved for regular diet with thin liquids with PMV in place   Objective   Blood pressure (!) 143/81, pulse 92, temperature 98 F (36.7 C), temperature source Oral, resp. rate 15, height 5' (1.524 m), weight 67.6 kg, SpO2 100 %.    FiO2 (%):  [28 %] 28 %   Intake/Output Summary (  Last 24 hours) at 08/31/2022 0805 Last data filed at 08/31/2022 0600 Gross per 24 hour  Intake 1801 ml  Output 1675 ml  Net 126 ml   Filed Weights   08/06/22 1541 08/09/22 0931  Weight: 65.3 kg 67.6 kg     General -ill-appearing woman, comfortable on ATC with PMV in place Eyes -as  open, tracks, pupils equal ENT -PMV in place, no secretions, strong voice Cardiac -regular, distant, no murmur Chest -clear bilaterally Abdomen -nondistended, positive bowel sounds Extremities -no significant edema Skin -no rash Neuro -awake, interacting, follows commands but weak, strong voice  Resolved Problems:  Sepsis, Acute metabolic encephalopathy 2nd to sepsis, Acute hypoxic respiratory failure, Aspiration pneumonitis, VT likely from hypokalemia  Assessment & Plan:   Meningitis/ventriculitis in 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 P: -As per ID recs, plan for 6 weeks of antibiotics, currently on vancomycin, cefepime, Flagyl -Planning for repeat MRI, probably today 2/13 with neurosurgery follow-up  Compromised airway. - S/p tracheostomy 2/06 P: -Continue routine trach care -Working with PMV successfully, cleared for diet -Suture removal 2/13  Hyponatremia with hypochloremia. -Follow intermittent BMP -Salt tabs ordered -Replace volume losses as able  Hypokalemia. -Continue to replete as needed, goal 4.0   Anemia of critical illness. -Follow intermittent CBC, hemoglobin goal 7.0  DM type 2. -Scale insulin as ordered   Chronic combined CHF, CAD, HTN. -Continue carvedilol -Dosing diuretics daily to maintain adequate volume management.  None planned today 2/13  Urine retention. -Foley in place -Will need another voiding trial soon  Hx of Bipolar, mild cognitive deficit. -Plan to continue: aricept, luvox, neurontin, lamictal, trazodone  Hx of hypothyroidism. -Continue levothyroxine  Moderate malnutrition. -Tube feeding as ordered  Debility -Working with PT/OT/SLP   Best Practice (right click and "Reselect all SmartList Selections" daily)   Diet/type: tubefeeds DVT prophylaxis: prophylactic heparin  GI prophylaxis: H2B Lines: Central line Foley:  N/A Code Status:  full code Last date of multidisciplinary goals of care  discussion:   Labs:      Latest Ref Rng & Units 08/31/2022    5:46 AM 08/30/2022    3:58 AM 08/29/2022    3:55 AM  CMP  Glucose 70 - 99 mg/dL 109  124  325   BUN 8 - 23 mg/dL 9  7  9   $ Creatinine 0.44 - 1.00 mg/dL <0.30  <0.30  <0.30   Sodium 135 - 145 mmol/L 130  132  125   Potassium 3.5 - 5.1 mmol/L 3.7  3.7  3.4   Chloride 98 - 111 mmol/L 94  98  87   CO2 22 - 32 mmol/L 26  27  28   $ Calcium 8.9 - 10.3 mg/dL 8.1  7.6  7.7   Total Protein 6.5 - 8.1 g/dL 5.0     Total Bilirubin 0.3 - 1.2 mg/dL 0.5     Alkaline Phos 38 - 126 U/L 60     AST 15 - 41 U/L 21     ALT 0 - 44 U/L 13          Latest Ref Rng & Units 08/31/2022    5:46 AM 08/30/2022    3:58 AM 08/29/2022    3:55 AM  CBC  WBC 4.0 - 10.5 K/uL 8.8  8.6  9.3   Hemoglobin 12.0 - 15.0 g/dL 8.6  8.5  8.8   Hematocrit 36.0 - 46.0 % 27.1  25.9  26.4   Platelets 150 - 400 K/uL 588  500  548     CBG (last 3)  Recent Labs    08/30/22 1927 08/30/22 2307 08/31/22 0326  GLUCAP 137* 108* 42*    Baltazar Apo, MD, PhD 08/31/2022, 8:23 AM [facility] [phone] or if no answer before 7:00PM call [phone] For any issues after 7:00PM please call eLink [phone]

## 2022-09-01 DIAGNOSIS — J9601 Acute respiratory failure with hypoxia: Secondary | ICD-10-CM | POA: Diagnosis not present

## 2022-09-01 DIAGNOSIS — R6520 Severe sepsis without septic shock: Secondary | ICD-10-CM

## 2022-09-01 DIAGNOSIS — G9341 Metabolic encephalopathy: Secondary | ICD-10-CM

## 2022-09-01 DIAGNOSIS — J690 Pneumonitis due to inhalation of food and vomit: Secondary | ICD-10-CM | POA: Diagnosis not present

## 2022-09-01 DIAGNOSIS — G9340 Encephalopathy, unspecified: Secondary | ICD-10-CM | POA: Diagnosis not present

## 2022-09-01 DIAGNOSIS — T8130XA Disruption of wound, unspecified, initial encounter: Secondary | ICD-10-CM | POA: Diagnosis not present

## 2022-09-01 LAB — GLUCOSE, CAPILLARY
Glucose-Capillary: 110 mg/dL — ABNORMAL HIGH (ref 70–99)
Glucose-Capillary: 168 mg/dL — ABNORMAL HIGH (ref 70–99)
Glucose-Capillary: 148 mg/dL — ABNORMAL HIGH (ref 70–99)
Glucose-Capillary: 135 mg/dL — ABNORMAL HIGH (ref 70–99)

## 2022-09-01 LAB — VANCOMYCIN, TROUGH: Vancomycin Tr: 16 ug/mL (ref 15–20)

## 2022-09-01 MED ORDER — FENTANYL BOLUS VIA INFUSION: 25.0000 ug | Freq: Four times a day (QID) | INTRAVENOUS | Status: DC | PRN

## 2022-09-01 MED ORDER — FENTANYL CITRATE PF 50 MCG/ML IJ SOSY
25.0000 ug | PREFILLED_SYRINGE | Freq: Four times a day (QID) | INTRAMUSCULAR | Status: DC | PRN
  Administered 2022-09-02 – 2022-09-03 (×4): 50 ug via INTRAVENOUS
  Filled 2022-09-01 (×4): qty 1

## 2022-09-01 MED ORDER — MELATONIN 5 MG PO TABS: 5.0000 mg | ORAL_TABLET | Freq: Every evening | ORAL | Status: DC | PRN

## 2022-09-01 NOTE — Progress Notes (Addendum)
[facility] Infectious Diseases Follow Up Note  Patient Identification: Patient Name: [name] MRN: [id number] Admit Date: 08/09/2022  9:08 AM Age: 67 y.o.Today's Date: 09/01/2022  Reason for Visit: ventriculitis and meningitis   Principal Problem:   Wound dehiscence Active Problems:   Encephalopathy acute   Aspiration pneumonia of both lungs (HCC)   Acute respiratory failure with hypoxia (HCC)   Sepsis (HCC)   Seizure (HCC)   Malnutrition of moderate degree   Bacterial meningitis   Hardware complicating wound infection (HCC)   Infection of deep incisional surgical site after procedure  Microbiology 2/2 tracheal aspirate - candida albicans 2/6 tracheal aspirate - candida albicans and mold  2/8 BAL - candida albicans and mold    Current Antibiotics:  Vancomycin, cefepime, metronidazole   Lines/Hardwares: Left TKA, left shoulder arthroplasty, rt hip arthroplasty  Rt arm PICC  Interval Events:  Assessment 67 Y O Female with PMH as below including cognitive decline, s/p L1-L5 fracture stabilization and posterolateral instrumented fusion 06/26/22 , readmitted 12/21-12/1 for wound concerns thought to be non infectious, complicated by wound dehiscence with exposed hardware requiring wound revision with removal of left L1, L2, L3, L4, and L5 lumbar screws and rod, removal of right L1 screw, revision of L1-L5 posterior instrumented fusion 1/22 with CSF leak s/p repair 1/22.  Started on doxycycline post op, became encephalopathic, added ciprofloxacin which was switched to cefadroxil for concerns of UTI. Fevers/leukocytosis starting 1/30 with ongoing confusion and escalation of abtx to Vancomycin and zosyn. Further complicated by dyspnea and tachycardia, ID/[name] consulted, abxt switched to Vancomycin, cefepime and metronidazole for concerns for meningitis.  Intubated for hypoxia. 2/1 CTA chest concerning for PNA. 2/2 MRI brain  with diffusion abnormality within the occipital horn of the left lateral ventricle may indicate proteinaceous/purulent debris. S/p tracheostomy 2/6. Repeat MRI 2/13 with findings of ventriculitis  # Possible Health care associated/Nosocomial meningitis/ventriculitis  - LP unable to be done given h/o lumbar wound to r/o meningitis   # Lumbar wound dehiscence/Hardware associated Lumbar osteomyelitis  - No positive cultures to target   Recommendations Continue Vancomycin, pharmacy to dose, cefepime and metronidazole. Plan as previously outlined for 6 weeks ( EOT 09/30/22) with possible need to switch to PO abtx thereafter for hardware associated lumbar vertebral osteomyelitis.  Monitor CBC, BMP and Vancomycin trough Repeat MRI in 4 weeks per Dr Jules Husbandsstergard Fu in ID clinic arranged in 2-3 weeks ID available as needed, please call with questions   Rest of the management as per the primary team. Thank you for the consult. Please page with pertinent questions or concerns.  ______________________________________________________________________ Subjective patient seen and examined at the bedside. Spoke with her children at bedside.   Past Medical History:  Diagnosis Date   Anemia    Anxiety    Arthritis    Depression    GERD (gastroesophageal reflux disease)    Heart murmur    "related to VSD"   High cholesterol    History of blood transfusion    "related to OR" (08/19/2016)   History of hiatal hernia    Hypertension    Hyperthyroidism    Mild cognitive impairment 09/06/2018   Paroxysmal ventricular tachycardia (HCC)    Type II diabetes mellitus (HCC)    UTI (urinary tract infection)    being treated with Keflex   Ventricular septal defect    Past Surgical History:  Procedure Laterality Date   ABDOMINAL HYSTERECTOMY     BACK SURGERY     CARDIAC CATHETERIZATION  N/A 06/23/2015   Procedure: Left Heart Cath and Coronary Angiography;  Surgeon: Laurey Morale, MD;  Location: [facility]  INVASIVE CV LAB;  Service: Cardiovascular;  Laterality: N/A;   CARDIAC CATHETERIZATION  1960   "VSD was so small; didn't need repaired"   EXAM UNDER ANESTHESIA WITH MANIPULATION OF HIP Right 06/02/2014   dr Jerl Santos   FRACTURE SURGERY     HERNIA REPAIR     HIP CLOSED REDUCTION Right 06/02/2014   Procedure: CLOSED MANIPULATION HIP;  Surgeon: Velna Ochs, MD;  Location: [facility] OR;  Service: Orthopedics;  Laterality: Right;   JOINT REPLACEMENT     JOINT REPLACEMENT     POSTERIOR LUMBAR FUSION 4 LEVEL N/A 06/26/2022   Procedure: Lumbar One To Lumbar Five Posterior Instrumented Fusion;  Surgeon: Jadene Pierini, MD;  Location: [facility] OR;  Service: Neurosurgery;  Laterality: N/A;   REFRACTIVE SURGERY Bilateral    SHOULDER ARTHROSCOPY Right    SHOULDER OPEN ROTATOR CUFF REPAIR Right    SPINAL FUSION  1996   "t10 down to my coccyx   SPINE HARDWARE REMOVAL     TOTAL ABDOMINAL HYSTERECTOMY     TOTAL HIP ARTHROPLASTY Right 05/10/2014   [place]      by dr Cristal Deer olcott   TOTAL KNEE ARTHROPLASTY Left    TOTAL SHOULDER ARTHROPLASTY Left 08/19/2016   Procedure: TOTAL SHOULDER ARTHROPLASTY;  Surgeon: Jones Broom, MD;  Location: [facility] OR;  Service: Orthopedics;  Laterality: Left;  Left total shoulder replacement   Vitals BP (!) 160/92   Pulse 88   Temp 97.8 F (36.6 C) (Oral)   Resp 15   Ht 5' (1.524 m)   Wt 67.6 kg   SpO2 100%   BMI 29.10 kg/m     Physical Exam Constitutional:  elderly female lying in the bed, not in acute distress    Comments: s/p trach, no surrounding cellulitis   Cardiovascular:     Rate and Rhythm: Normal rate and regular rhythm.     Heart sounds:   Pulmonary:     Effort: Pulmonary effort is normal on trach to vent with Fio2 at 40%    Comments: coarse breath sounds   Abdominal:     Palpations: Abdomen is soft.     Tenderness: non distended and non tender   Musculoskeletal:        General: No swelling or tenderness in peripheral joints.    Skin:    Comments: No rashes. Back unable to examine ( per wound care nurse note today- 13 cm suture line with open areas at the proximal and distal end, 0.2 cm open area near center of wound )  Neurological:     General: grossly non focal, awake, alert and oriented, follows commands   Psychiatric:        Mood and Affect: Mood normal.   Pertinent Microbiology Results for orders placed or performed during the hospital encounter of 08/09/22  Surgical pcr screen     Status: Abnormal   Collection Time: 08/09/22 10:15 AM   Specimen: Nasal Mucosa; Nasal Swab  Result Value Ref Range Status   MRSA, PCR NEGATIVE NEGATIVE Corrected   Staphylococcus aureus POSITIVE (A) NEGATIVE Corrected    Comment: (NOTE) The Xpert SA Assay (FDA approved for NASAL specimens in patients 52 years of age and older), is one component of a comprehensive surveillance program. It is not intended to diagnose infection nor to guide or monitor treatment. Performed at [facility], [address],  [place], [place] [address] CORRECTED ON 01/22 AT 1607: PREVIOUSLY REPORTED AS POSITIVE   C Difficile Quick Screen (NO PCR Reflex)     Status: None   Collection Time: 08/15/22  3:27 PM   Specimen: STOOL  Result Value Ref Range Status   C Diff antigen NEGATIVE NEGATIVE Final   C Diff toxin NEGATIVE NEGATIVE Final   C Diff interpretation No C. difficile detected.  Final    Comment: Performed at [facility], [address], [place], [place] [address]  Culture, blood (Routine X 2) w Reflex to ID Panel     Status: None   Collection Time: 08/17/22  6:59 PM   Specimen: BLOOD  Result Value Ref Range Status   Specimen Description BLOOD LEFT ANTECUBITAL  Final   Special Requests   Final    BOTTLES DRAWN AEROBIC AND ANAEROBIC Blood Culture results may not be optimal due to an inadequate volume of blood received in culture bottles   Culture   Final    NO GROWTH 5 DAYS Performed at [facility], [address], [place], [place] [address]    Report Status 08/22/2022 FINAL  Final  Culture, blood (Routine X 2) w Reflex to ID Panel     Status: None   Collection Time: 08/17/22  7:01 PM   Specimen: BLOOD  Result Value Ref Range Status   Specimen Description BLOOD LEFT ANTECUBITAL  Final   Special Requests   Final    BOTTLES DRAWN AEROBIC AND ANAEROBIC Blood Culture results may not be optimal due to an inadequate volume of blood received in culture bottles   Culture   Final    NO GROWTH 5 DAYS Performed at [facility], [address], [place], [place] [address]    Report Status 08/22/2022 FINAL  Final  Culture, Respiratory w Gram Stain     Status: None   Collection Time: 08/20/22 12:22 AM   Specimen: Tracheal Aspirate; Respiratory  Result Value Ref Range Status   Specimen Description TRACHEAL ASPIRATE  Final   Special Requests NONE  Final   Gram Stain   Final    FEW WBC PRESENT,BOTH PMN AND MONONUCLEAR RARE YEAST WITH PSEUDOHYPHAE Performed at [facility], [address], [place], [place] [address]    Culture FEW CANDIDA ALBICANS  Final   Report Status 08/22/2022 FINAL  Final  Culture, Respiratory w Gram Stain     Status: None   Collection Time: 08/24/22  9:07 AM   Specimen: Tracheal Aspirate; Respiratory  Result Value Ref Range Status   Specimen Description TRACHEAL ASPIRATE  Final   Special Requests NONE  Final   Gram Stain   Final    MODERATE WBC PRESENT, PREDOMINANTLY PMN NO ORGANISMS SEEN Performed at [facility], [address] [place], [place] [address]    Culture   Final    MODERATE CANDIDA ALBICANS FUNGUS (MOLD) ISOLATED, PROBABLE CONTAMINANT/COLONIZER (SAPROPHYTE). CONTACT MICROBIOLOGY IF FURTHER IDENTIFICATION REQUIRED [phone].    Report Status 08/26/2022 FINAL  Final  Culture, Respiratory w Gram Stain     Status: None   Collection Time: 08/24/22  2:55 PM   Specimen: Bronchoalveolar Lavage; Respiratory  Result Value Ref Range Status    Specimen Description BRONCHIAL ALVEOLAR LAVAGE  Final   Special Requests NONE  Final   Gram Stain   Final    ABUNDANT WBC PRESENT, PREDOMINANTLY PMN NO ORGANISMS SEEN Performed at [facility], [address],  [place], [place] [address]    Culture   Final    RARE CANDIDA ALBICANS FUNGUS (MOLD) ISOLATED, PROBABLE CONTAMINANT/COLONIZER (SAPROPHYTE). CONTACT MICROBIOLOGY IF FURTHER IDENTIFICATION REQUIRED [phone].    Report Status 08/27/2022 FINAL  Final   Pertinent Lab.    Latest Ref Rng & Units 08/31/2022    5:46 AM 08/30/2022    3:58 AM 08/29/2022    3:55 AM  CBC  WBC 4.0 - 10.5 K/uL 8.8  8.6  9.3   Hemoglobin 12.0 - 15.0 g/dL 8.6  8.5  8.8   Hematocrit 36.0 - 46.0 % 27.1  25.9  26.4   Platelets 150 - 400 K/uL 588  500  548       Latest Ref Rng & Units 08/31/2022    5:46 AM 08/30/2022    3:58 AM 08/29/2022    3:55 AM  CMP  Glucose 70 - 99 mg/dL 914  782  956   BUN 8 - 23 mg/dL 9  7  9    Creatinine 0.44 - 1.00 mg/dL <2.13  <0.86  <5.78   Sodium 135 - 145 mmol/L 130  132  125   Potassium 3.5 - 5.1 mmol/L 3.7  3.7  3.4   Chloride 98 - 111 mmol/L 94  98  87   CO2 22 - 32 mmol/L 26  27  28    Calcium 8.9 - 10.3 mg/dL 8.1  7.6  7.7   Total Protein 6.5 - 8.1 g/dL 5.0     Total Bilirubin 0.3 - 1.2 mg/dL 0.5     Alkaline Phos 38 - 126 U/L 60     AST 15 - 41 U/L 21     ALT 0 - 44 U/L 13        Pertinent Imaging today Plain films and CT images have been personally visualized and interpreted; radiology reports have been reviewed. Decision making incorporated into the Impression / Recommendations.  MR BRAIN W WO CONTRAST  Result Date: 08/31/2022 CLINICAL DATA:  Meningitis/CNS infection suspected. Follow-up ventricular debris. EXAM: MRI HEAD WITHOUT AND WITH CONTRAST TECHNIQUE: Multiplanar, multiecho pulse sequences of the brain and surrounding structures were obtained without and with intravenous contrast. CONTRAST:  7mL GADAVIST GADOBUTROL 1 MMOL/ML IV SOLN COMPARISON:   MRI brain 08/20/2022. FINDINGS: Brain: No acute infarct. Redemonstrated dependent diffusion signal abnormality in the occipital horn of the left lateral ventricle (image 21 series 2) with associated ependymal enhancement in both occipital horns (images 21-23 series 10). No ventricular dilatation. No other foci of abnormal diffusion signal or enhancement. Unchanged moderate chronic small-vessel disease. Vascular: Unremarkable flow voids within limitations of motion artifact. Skull and upper cervical spine: Normal marrow signal and enhancement. Sinuses/Orbits: Increased right mastoid effusion. No nasopharyngeal mass. Orbits are unremarkable. Other: None. IMPRESSION: Redemonstrated dependent diffusion signal abnormality in the occipital horn of the left lateral ventricle with associated ependymal enhancement in both occipital horns, concerning for ventriculitis. No ventricular dilatation. Electronically Signed   By: Orvan Falconer M.D.   On: 08/31/2022 14:44   DG Swallowing Func-Speech Pathology  Result Date: 08/30/2022 Table formatting from the original result was not included. Modified Barium Swallow Study Patient Details Name: [name] MRN: [id number] Date of Birth: January 06, 1956 Today's Date: 08/30/2022 HPI/PMH: HPI: Patient is a 67 y/o female who presents on 1/22 for revision of complex lumbar wound, removal of left L1-L5 lumbar screws and rod, removal of right L1 screw and revision of L1-L5 posterior instrumented fusion. Current admission has been complicated by encephalopathy which initially seemed  2/2 polypharmacy, then thought possible UTI, and Cefadroxil was added 1/26.  CNS depressing medications were also de-escalated.  Despite these measures, Pt become increasingly encephalopathic. 2/1: Admitted to ICU and intubated. 2/2: MRI brain concerning for meningitis. 2/6: Pt was extubated although re-intubated due to fatigue and airway protection; trached. PMH includes GERD, HH, DM, HTN, anxiety, left  TSA, arthritis. Esophagram in 2021 revealed previous Nissen fundoplication as well as moderate to severe esophageal dysmotility. Clinical Impression: Pt demonstrates swallow within functional limits for age with trace to mild differences that do not impact safety with PO. Pt does have cortrak in place. Also has PMSV in place for exam. Differences in swallow function include initiation of hyoid burst past the ramus of the mandible with most thin and nectar trials. Pt has an instance of trace frank penetration of thin while taking pill due to late and slightly incomplete epiglottic deflection. There is trace residue at times in the base of tongue and vallecular region that clear with subsequent swallows. Pill also lodged in vallecular region as well as in the mid esophageal column. Recommend pt consume regular textured solids with thin liquids with PMSV in place, but take pills whole with puree. Noted trace collection of barium in tissue anterior to the esophagus on image 49 (44/246) and in other trials of liquids. Will report to radiologist for futher review Factors that may increase risk of adverse event in presence of aspiration Rubye Oaks & Clearance Coots 2021): Factors that may increase risk of adverse event in presence of aspiration Rubye Oaks & Clearance Coots 2021): Limited mobility; Frail or deconditioned; Dependence for feeding and/or oral hygiene; Presence of tubes (ETT, trach, NG, etc.) Recommendations/Plan: Swallowing Evaluation Recommendations Swallowing Evaluation Recommendations Recommendations: PO diet PO Diet Recommendation: Regular; Thin liquids (Level 0) Liquid Administration via: Cup; Straw Medication Administration: Whole meds with puree Supervision: Patient able to self-feed Swallowing strategies  : Place PMSV during PO intake; Minimize environmental distractions; Slow rate; Small bites/sips Postural changes: Position pt fully upright for meals; Stay upright 30-60 min after meals Oral care recommendations: Oral care  BID (2x/day) Treatment Plan Treatment Plan Treatment recommendations: Therapy as outlined in treatment plan below Follow-up recommendations: Acute inpatient rehab (3 hours/day) Functional status assessment: Patient has had a recent decline in their functional status and demonstrates the ability to make significant improvements in function in a reasonable and predictable amount of time. Treatment frequency: Min 2x/week Treatment duration: 1 week Interventions: Aspiration precaution training; Patient/family education; Diet toleration management by SLP Recommendations Recommendations for follow up therapy are one component of a multi-disciplinary discharge planning process, led by the attending physician.  Recommendations may be updated based on patient status, additional functional criteria and insurance authorization. Assessment: Orofacial Exam: Orofacial Exam Oral Cavity: Oral Hygiene: WFL Oral Cavity - Dentition: Adequate natural dentition Orofacial Anatomy: WFL Oral Motor/Sensory Function: WFL Anatomy: Anatomy: WFL Thin Liquids: Thin Liquids (Level 0) Thin Liquids : WFL Bolus delivery method: Cup Initiation of swallow : Posterior laryngeal surface of the epiglottis  Mildly Thick Liquids: Mildly thick liquids (Level 2, nectar thick) Mildly thick liquids (Level 2, nectar thick): Impaired Bolus delivery method: Cup Mildly Thick Liquid - Impairment: Oral Impairment; Pharyngeal impairment Lip Closure: Escape from interlabial space or lateral juncture, no extension beyond vermillion border Tongue control during bolus hold: Cohesive bolus between tongue to palatal seal Bolus transport/lingual motion: Brisk tongue motion Oral residue: Trace residue lining oral structures Location of oral residue : Tongue; Palate Initiation of swallow : Valleculae Soft palate elevation: Complete Laryngeal  elevation: Complete superior movement of thyroid cartilage with complete approximation of arytenoids to epiglottic petiole Anterior  hyoid excursion: Complete Epiglottic movement: Partial Laryngeal vestibule closure: Complete: No air/contrast in laryngeal vestibule Pharyngeal stripping wave : Present - complete Pharyngeal contraction (A/P view only): Complete Pharyngoesophageal segment opening: Complete distension and complete duration, no obstruction of flow Tongue base retraction: No contrast between tongue base and posterior pharyngeal wall (PPW) Pharyngeal residue: Trace residue within or on pharyngeal structures Location of pharyngeal residue: Valleculae; Pyriform sinuses Penetration/Aspiration Scale (PAS) score: 1.  Material does not enter airway  Moderately Thick Liquids: Moderately thick liquids (Level 3, honey thick) Moderately thick liquids (Level 3, honey thick): Impaired Bolus delivery method: Spoon Moderately Thick Liquid - Impairment: Pharyngeal impairment; Oral Impairment Lip Closure: Interlabial escape, no progression to anterior lip Tongue control during bolus hold: Cohesive bolus between tongue to palatal seal Bolus transport/lingual motion: Brisk tongue motion Oral residue: Complete oral clearance Location of oral residue : N/A Initiation of swallow : Posterior angle of the ramus Soft palate elevation: Complete Laryngeal elevation: Complete superior movement of thyroid cartilage with complete approximation of arytenoids to epiglottic petiole Anterior hyoid excursion: Complete Epiglottic movement: Partial Laryngeal vestibule closure: Complete: No air/contrast in laryngeal vestibule Pharyngeal stripping wave : Present - complete Pharyngeal contraction (A/P view only): N/A Pharyngoesophageal segment opening: Complete distension and complete duration, no obstruction of flow Tongue base retraction: Trace column of contrast or air between tongue base and PPW Pharyngeal residue: Trace residue within or on pharyngeal structures Location of pharyngeal residue: Valleculae; Tongue base Penetration/Aspiration Scale (PAS) score: 1.   Material does not enter airway  Puree: Puree Puree: Impaired Puree - Impairment: Pharyngeal impairment Initiation of swallow: Posterior angle of the ramus Soft palate elevation: Complete Laryngeal elevation: Complete superior movement of thyroid cartilage with complete approximation of arytenoids to epiglottic petiole Anterior hyoid excursion: Complete Epiglottic movement: Partial Laryngeal vestibule closure: Complete: No air/contrast in laryngeal vestibule Pharyngeal stripping wave : Present - complete Pharyngeal contraction (A/P view only): Complete Pharyngoesophageal segment opening: Complete distension and complete duration, no obstruction of flow Tongue base retraction: Trace column of contrast or air between tongue base and PPW Pharyngeal residue: Trace residue within or on pharyngeal structures Location of pharyngeal residue: Tongue base; Valleculae Penetration/Aspiration Scale (PAS) score: 1.  Material does not enter airway Solid: Solid Solid: Impaired Solid - Impairment: Oral Impairment; Pharyngeal impairment Lip Closure: No labial escape Bolus preparation/mastication: Timely and efficient chewing and mashing Bolus transport/lingual motion: Brisk tongue motion Oral residue: Trace residue lining oral structures Location of oral residue : Tongue; Palate Initiation of swallow: Posterior angle of the ramus Soft palate elevation: Complete Laryngeal elevation: Complete superior movement of thyroid cartilage with complete approximation of arytenoids to epiglottic petiole Anterior hyoid excursion: Complete Epiglottic movement: Partial Laryngeal vestibule closure: Complete: No air/contrast in laryngeal vestibule Pharyngeal stripping wave : Present - complete Pharyngoesophageal segment opening: Complete distension and complete duration, no obstruction of flow Tongue base retraction: Narrow column of contrast or air between tongue base and PPW Pharyngeal residue: Collection of residue within or on pharyngeal  structures Location of pharyngeal residue: Valleculae; Tongue base Penetration/Aspiration Scale (PAS) score: 1.  Material does not enter airway Pill: Pill Pill: Impaired Consistency administered : thin Pill - Impairment: Oral Impairment; Pharyngeal impairment Lip Closure: Escape from interlabial space or lateral juncture, no extension beyond vermillion border Bolus transport/lingual motion: Brisk tongue motion Oral residue: Complete oral clearance Initiation of swallow : Pyriform sinuses Soft palate elevation: Complete Laryngeal  elevation: Complete superior movement of thyroid cartilage with complete approximation of arytenoids to epiglottic petiole Anterior hyoid excursion: Complete Epiglottic movement: Partial Laryngeal vestibule closure: Complete: No air/contrast in laryngeal vestibule Pharyngeal stripping wave : Present - complete Pharyngeal contraction (A/P view only): N/A Pharyngoesophageal segment opening: Complete distension and complete duration, no obstruction of flow Tongue base retraction: Wide column of contrast or air between tongue base and PPW Pharyngeal residue: Majority of contrast within or on pharyngeal structures Location of pharyngeal residue: Valleculae; Pyriform sinuses Penetration/Aspiration Scale (PAS) score: 5.  Material enters airway, CONTACTS cords and not ejected out Compensatory Strategies: Compensatory Strategies Compensatory strategies: No   General Information: Caregiver present: Yes Banker)  Diet Prior to this Study: NPO; Cortrak/Small bore NG tube   Temperature : Normal   Respiratory Status: WFL   Supplemental O2: Trach Collar   No data recorded Behavior/Cognition: Alert; Distractible Self-Feeding Abilities: Dependent for feeding Baseline vocal quality/speech: Normal Volitional Cough: Able to elicit Volitional Swallow: Able to elicit No data recorded Goal Planning: Prognosis for improved oropharyngeal function: Good Barriers to Reach Goals: Cognitive deficits No data recorded  Patient/Family Stated Goal: patient wants a soda Consulted and agree with results and recommendations: Patient; Nurse Pain: Pain Assessment Pain Assessment: Faces Pain Score: 10 Faces Pain Scale: 4 Breathing: 0 Negative Vocalization: 0 Facial Expression: 0 Body Language: 0 Consolability: 0 PAINAD Score: 0 Facial Expression: 0 Body Movements: 0 Muscle Tension: 0 Compliance with ventilator (intubated pts.): N/A Vocalization (extubated pts.): 0 CPOT Total: 0 Pain Location: back with mobility Pain Descriptors / Indicators: Grimacing; Guarding; Discomfort Pain Intervention(s): Limited activity within patient's tolerance; Monitored during session; Repositioned End of Session: Start Time:SLP Start Time (ACUTE ONLY): 1224 Stop Time: SLP Stop Time (ACUTE ONLY): 1246 Time Calculation:SLP Time Calculation (min) (ACUTE ONLY): 22 min Charges: SLP Evaluations $ SLP Speech Visit: 1 Visit SLP Evaluations $BSS Swallow: 1 Procedure $MBS Swallow: 1 Procedure $ SLP Eval Voice Prosthetic Device: Procedure $$ Passy Muir Speaking Valve: yes $Speech Treatment for Individual: 1 Procedure SLP visit diagnosis: SLP Visit Diagnosis: Dysphagia, unspecified (R13.10) Past Medical History: Past Medical History: Diagnosis Date  Anemia   Anxiety   Arthritis   Depression   GERD (gastroesophageal reflux disease)   Heart murmur   "related to VSD"  High cholesterol   History of blood transfusion   "related to OR" (08/19/2016)  History of hiatal hernia   Hypertension   Hyperthyroidism   Mild cognitive impairment 09/06/2018  Paroxysmal ventricular tachycardia (HCC)   Type II diabetes mellitus (HCC)   UTI (urinary tract infection)   being treated with Keflex  Ventricular septal defect  Past Surgical History: Past Surgical History: Procedure Laterality Date  ABDOMINAL HYSTERECTOMY    BACK SURGERY    CARDIAC CATHETERIZATION N/A 06/23/2015  Procedure: Left Heart Cath and Coronary Angiography;  Surgeon: Laurey Morale, MD;  Location: [facility] INVASIVE CV LAB;   Service: Cardiovascular;  Laterality: N/A;  CARDIAC CATHETERIZATION  1960  "VSD was so small; didn't need repaired"  EXAM UNDER ANESTHESIA WITH MANIPULATION OF HIP Right 06/02/2014  dr Jerl Santos  FRACTURE SURGERY    HERNIA REPAIR    HIP CLOSED REDUCTION Right 06/02/2014  Procedure: CLOSED MANIPULATION HIP;  Surgeon: Velna Ochs, MD;  Location: [facility] OR;  Service: Orthopedics;  Laterality: Right;  JOINT REPLACEMENT    JOINT REPLACEMENT    POSTERIOR LUMBAR FUSION 4 LEVEL N/A 06/26/2022  Procedure: Lumbar One To Lumbar Five Posterior Instrumented Fusion;  Surgeon: Jadene Pierini, MD;  Location: [facility] OR;  Service: Neurosurgery;  Laterality: N/A;  REFRACTIVE SURGERY Bilateral   SHOULDER ARTHROSCOPY Right   SHOULDER OPEN ROTATOR CUFF REPAIR Right   SPINAL FUSION  1996  "t10 down to my coccyx  SPINE HARDWARE REMOVAL    TOTAL ABDOMINAL HYSTERECTOMY    TOTAL HIP ARTHROPLASTY Right 05/10/2014  [place]      by dr Cristal Deer olcott  TOTAL KNEE ARTHROPLASTY Left   TOTAL SHOULDER ARTHROPLASTY Left 08/19/2016  Procedure: TOTAL SHOULDER ARTHROPLASTY;  Surgeon: Jones Broom, MD;  Location: [facility] OR;  Service: Orthopedics;  Laterality: Left;  Left total shoulder replacement DeBlois, Riley Nearing 08/30/2022, 1:44 PM  DG Chest Port 1 View  Result Date: 08/24/2022 CLINICAL DATA:  Status post tracheostomy. EXAM: PORTABLE CHEST 1 VIEW COMPARISON:  August 23, 2022 FINDINGS: Status post tracheostomy tube placement in satisfactory position. Normal heart size. No evidence of pneumothorax. Bilateral pleural effusions with bilateral streaky airspace opacities. Feeding catheter noted. IMPRESSION: Tracheostomy tube in satisfactory position. Persistent bilateral streaky airspace opacities. Electronically Signed   By: Ted Mcalpine M.D.   On: 08/24/2022 15:26   DG CHEST PORT 1 VIEW  Result Date: 08/23/2022 CLINICAL DATA:  Intubation. EXAM: PORTABLE CHEST 1 VIEW COMPARISON:  Chest radiograph dated 08/22/2022. FINDINGS:  Interval retraction of the endotracheal tube with tip approximately 4.5 cm above the carina. Right-sided PICC in similar position. Feeding tube extends below the diaphragm with tip beyond the inferior margin of the image. There is stable cardiomegaly with mild central vascular congestion. Left lung base atelectasis or infiltrate. No large pleural effusion. No pneumothorax. Degenerative changes of the spine and scoliosis. Left shoulder arthroplasty. No acute osseous pathology. IMPRESSION: 1. Interval retraction of the endotracheal tube with tip above the carina. 2. Cardiomegaly with mild central vascular congestion. Electronically Signed   By: Elgie Collard M.D.   On: 08/23/2022 20:14   DG CHEST PORT 1 VIEW  Result Date: 08/22/2022 CLINICAL DATA:  Respiratory failure EXAM: PORTABLE CHEST 1 VIEW COMPARISON:  08/19/2022 FINDINGS: Support apparatus: Endotracheal tube terminates 1.8 cm above carina. Feeding tube terminates at the gastric body. Right-sided PICC line tip at mid right atrium. Heart/mediastinum: Mild cardiomegaly. Pleura: Probable small left pleural effusion.  No pneumothorax. Lungs: Interstitial edema is improved to resolved. Persistent left base airspace disease. Other: Left shoulder arthroplasty. IMPRESSION: Cardiomegaly with improved to resolved interstitial edema. Persistent left base airspace disease and probable small left pleural effusion. Endotracheal tube 1.8 cm above carina. Recommend attention on follow-up versus retraction 2-3 cm. Electronically Signed   By: Jeronimo Greaves M.D.   On: 08/22/2022 09:58   ECHOCARDIOGRAM LIMITED  Result Date: 08/20/2022    ECHOCARDIOGRAM LIMITED REPORT   Patient Name:   [name] [name] Date of Exam: 08/20/2022 Medical Rec #:  [id number]           Height:       60.0 in Accession #:    [id number]          Weight:       149.0 lb Date of Birth:  16-Jun-1956           BSA:          1.647 m Patient Age:    66 years            BP:           104/69 mmHg Patient  Gender: F                   HR:  89 bpm. Exam Location:  Inpatient Procedure: Limited Echo, Color Doppler, Limited Color Doppler and Intracardiac            Opacification Agent Indications:    CHF  History:        Patient has prior history of Echocardiogram examinations, most                 recent 04/13/2019. Risk Factors:Hypertension, Diabetes and                 Current Smoker.  Sonographer:    Cathie Hoops Referring Phys: [id number] GRACE E BOWSER  Sonographer Comments: Technically difficult study due to poor echo windows and echo performed with patient supine and on artificial respirator. Image acquisition challenging due to respiratory motion and restraints. IMPRESSIONS  1. Abnormal septal motion with septal and apical hypokinesis . Left ventricular ejection fraction, by estimation, is 40 to 45%. The left ventricle has mildly decreased function. The left ventricle has no regional wall motion abnormalities. The left ventricular internal cavity size was mildly dilated. Left ventricular diastolic parameters were normal.  2. Right ventricular systolic function is severely reduced. The right ventricular size is severely enlarged.  3. The mitral valve is abnormal. No evidence of mitral valve regurgitation. No evidence of mitral stenosis.  4. The aortic valve was not well visualized. There is mild calcification of the aortic valve. There is mild thickening of the aortic valve. Aortic valve regurgitation is not visualized. Aortic valve sclerosis is present, with no evidence of aortic valve  stenosis.  5. The inferior vena cava is normal in size with greater than 50% respiratory variability, suggesting right atrial pressure of 3 mmHg. FINDINGS  Left Ventricle: Abnormal septal motion with septal and apical hypokinesis. Left ventricular ejection fraction, by estimation, is 40 to 45%. The left ventricle has mildly decreased function. The left ventricle has no regional wall motion abnormalities. Definity contrast  agent was given IV to delineate the left ventricular endocardial borders. The left ventricular internal cavity size was mildly dilated. There is no left ventricular hypertrophy. Left ventricular diastolic parameters were normal. Right Ventricle: The right ventricular size is severely enlarged. No increase in right ventricular wall thickness. Right ventricular systolic function is severely reduced. Left Atrium: Left atrial size was normal in size. Right Atrium: Right atrial size was normal in size. Pericardium: There is no evidence of pericardial effusion. Mitral Valve: The mitral valve is abnormal. There is mild thickening of the mitral valve leaflet(s). There is mild calcification of the mitral valve leaflet(s). Mild mitral annular calcification. No evidence of mitral valve stenosis. Tricuspid Valve: The tricuspid valve is normal in structure. Tricuspid valve regurgitation is mild . No evidence of tricuspid stenosis. Aortic Valve: The aortic valve was not well visualized. There is mild calcification of the aortic valve. There is mild thickening of the aortic valve. Aortic valve regurgitation is not visualized. Aortic valve sclerosis is present, with no evidence of aortic valve stenosis. Aortic valve mean gradient measures 3.0 mmHg. Aortic valve peak gradient measures 5.7 mmHg. Pulmonic Valve: The pulmonic valve was normal in structure. Pulmonic valve regurgitation is not visualized. No evidence of pulmonic stenosis. Aorta: The aortic root is normal in size and structure. Venous: The inferior vena cava is normal in size with greater than 50% respiratory variability, suggesting right atrial pressure of 3 mmHg. IAS/Shunts: The interatrial septum appears to be lipomatous. No atrial level shunt detected by color flow Doppler. LEFT VENTRICLE PLAX 2D LVIDd:  4.40 cm     Diastology LVIDs:         3.00 cm     LV e' medial:    5.98 cm/s LV PW:         1.10 cm     LV E/e' medial:  12.0 LV IVS:        1.00 cm     LV e'  lateral:   7.29 cm/s                            LV E/e' lateral: 9.8  LV Volumes (MOD) LV vol d, MOD A2C: 28.2 ml LV vol d, MOD A4C: 85.0 ml LV vol s, MOD A2C: 14.9 ml LV vol s, MOD A4C: 44.0 ml LV SV MOD A2C:     13.3 ml LV SV MOD A4C:     85.0 ml LV SV MOD BP:      25.2 ml RIGHT VENTRICLE RV Basal diam:  3.80 cm RV Mid diam:    3.80 cm RV FAC:         -54.7 % LEFT ATRIUM         Index LA diam:    3.30 cm 2.00 cm/m  AORTIC VALVE AV Vmax:           119.00 cm/s AV Vmean:          75.800 cm/s AV VTI:            0.167 m AV Peak Grad:      5.7 mmHg AV Mean Grad:      3.0 mmHg LVOT Vmax:         71.80 cm/s LVOT Vmean:        46.900 cm/s LVOT VTI:          0.123 m LVOT/AV VTI ratio: 0.74 MITRAL VALVE               TRICUSPID VALVE MV Area (PHT): 4.86 cm    TR Peak grad:   22.1 mmHg MV Decel Time: 156 msec    TR Vmax:        235.00 cm/s MV E velocity: 71.60 cm/s MV A velocity: 78.00 cm/s  SHUNTS MV E/A ratio:  0.92        Systemic VTI: 0.12 m Charlton Haws MD Electronically signed by Charlton Haws MD Signature Date/Time: 08/20/2022/4:37:27 PM    Final    DG Abd Portable 1V  Result Date: 08/20/2022 CLINICAL DATA:  Encounter for feeding tube placement. EXAM: PORTABLE ABDOMEN - 1 VIEW COMPARISON:  None Available. FINDINGS: Tip of the weighted enteric tube is below the diaphragm in the midline in the region of the mid distal stomach. Nonobstructed upper abdominal bowel gas pattern. Scoliosis and lumbar degenerative change. IMPRESSION: Tip of the weighted enteric tube in the mid distal stomach. Electronically Signed   By: Narda Rutherford M.D.   On: 08/20/2022 11:39   [place] EKG SITE RITE  Result Date: 08/20/2022 If [facility] image not attached, placement could not be confirmed due to current cardiac rhythm.  MR Lumbar Spine W Wo Contrast  Result Date: 08/20/2022 CLINICAL DATA:  Recent spinal surgery.  Possible postop infection. EXAM: MRI LUMBAR SPINE WITHOUT AND WITH CONTRAST TECHNIQUE: Multiplanar and multiecho pulse  sequences of the lumbar spine were obtained without and with intravenous contrast. CONTRAST:  32mL GADAVIST GADOBUTROL 1 MMOL/ML IV SOLN COMPARISON:  06/25/2022 FINDINGS: Segmentation:  Standard. Alignment: Lumbar levoscoliosis with apex  at L2. grade 1 anterolisthesis at L5-S1 Vertebrae: L2-5 right-sided posterior fusion. Bone marrow edema at L3 has decreased. No new site of bone marrow signal abnormality or enhancement. No acute fracture. Conus medullaris and cauda equina: Conus extends to the L1 level. Conus and cauda equina appear normal. Paraspinal and other soft tissues: Dorsal postoperative changes. Disc levels: L1-2: No spinal canal or neural foraminal stenosis. L2-3: Right transpedicular screws. Widely patent spinal canal. No neural impingement. L3-4: Right transpedicular screws. No spinal canal or neural foraminal stenosis. L4-5: Right L4 transpedicular screw. Widely patent spinal canal. No foraminal stenosis. L5-S1: No spinal canal or neural foraminal stenosis. Visualized sacrum: Normal. IMPRESSION: 1. L2-5 right-sided posterior fusion with widely patent spinal canal and neural foramina. 2. Decreased bone marrow edema at L3. No new site of bone marrow signal abnormality or enhancement. Electronically Signed   By: Deatra Robinson M.D.   On: 08/20/2022 03:50   MR BRAIN W WO CONTRAST  Result Date: 08/20/2022 CLINICAL DATA:  Delirium EXAM: MRI HEAD WITHOUT AND WITH CONTRAST TECHNIQUE: Multiplanar, multiecho pulse sequences of the brain and surrounding structures were obtained without and with intravenous contrast. CONTRAST:  6mL GADAVIST GADOBUTROL 1 MMOL/ML IV SOLN COMPARISON:  Head CT 08/19/2022 Brain MRI 02/01/2017 FINDINGS: Brain: There are punctate foci of abnormal diffusion restriction within the right frontal lobe and in the occipital horn of the left lateral ventricle. The former is likely a punctate focus of ischemia but the latter may be proteinaceous or purulent debris. No chronic microhemorrhage or  siderosis. There is multifocal hyperintense T2-weighted signal within the white matter. Generalized volume loss. The midline structures are normal. There is no abnormal contrast enhancement. Vascular: Normal flow voids. Skull and upper cervical spine: Normal marrow signal. Sinuses/Orbits: Right mastoid fluid. Bilateral ocular lens replacements. Paranasal sinuses are clear. Other: None IMPRESSION: 1. Punctate focus of acute ischemia in the right frontal lobe. 2. Diffusion abnormality within the occipital horn of the left lateral ventricle may indicate proteinaceous/purulent debris. Blood considered less likely given the absence of SWI abnormality and lack of hemorrhage elsewhere. 3. No hemorrhage or mass effect. Electronically Signed   By: Deatra Robinson M.D.   On: 08/20/2022 03:44   DG CHEST PORT 1 VIEW  Result Date: 08/19/2022 CLINICAL DATA:  Check endotracheal tube EXAM: PORTABLE CHEST 1 VIEW COMPARISON:  08/18/2022 FINDINGS: Endotracheal tube is noted 1.8 cm above the carina. Gastric catheter extends into the stomach. Cardiac shadow is stable. Mild central vascular congestion is noted without edema. Mild atelectatic changes are seen in the bases. No acute bony abnormality is noted. IMPRESSION: Tubes and lines in satisfactory position. Increased central vascular congestion with basilar atelectasis. Electronically Signed   By: Alcide Clever M.D.   On: 08/19/2022 18:17   EEG adult  Result Date: 08/19/2022 Charlsie Quest, MD     08/19/2022  5:47 PM Patient Name: [name] MRN: [id number] Epilepsy Attending: Charlsie Quest Referring Physician/Provider: Lanier Clam, NP Date: 08/19/2022 Duration: 24.34 mins Patient history: 67yo F with ams. EEG to evaluate for seizure Level of alertness: Awake AEDs during EEG study: LTG, Propofol Technical aspects: This EEG study was done with scalp electrodes positioned according to the 10-20 International system of electrode placement. Electrical activity was  reviewed with band pass filter of 1-70Hz , sensitivity of 7 uV/mm, display speed of 76mm/sec with a 60Hz  notched filter applied as appropriate. EEG data were recorded continuously and digitally stored.  Video monitoring was available and reviewed as appropriate. Description:  EEG showed continuous generalized 3 to 6 Hz theta-delta slowing.  Hyperventilation and photic stimulation were not performed.   ABNORMALITY - Continuous slow, generalized IMPRESSION: This study is suggestive of moderate diffuse encephalopathy, nonspecific etiology. No seizures or epileptiform discharges were seen throughout the recording. Charlsie Quest   CT LUMBAR SPINE W CONTRAST  Result Date: 08/19/2022 CLINICAL DATA:  Low back pain, infection suspected EXAM: CT LUMBAR SPINE WITH CONTRAST TECHNIQUE: Multidetector CT imaging of the lumbar spine was performed with intravenous contrast administration. RADIATION DOSE REDUCTION: This exam was performed according to the departmental dose-optimization program which includes automated exposure control, adjustment of the mA and/or kV according to patient size and/or use of iterative reconstruction technique. CONTRAST:  33mL OMNIPAQUE IOHEXOL 350 MG/ML SOLN COMPARISON:  08/06/2022 CT lumbar spine FINDINGS: Segmentation: 5 lumbar type vertebrae. Alignment: Unchanged scoliosis, centered at L1. Redemonstrated fused grade 1 anterolisthesis of L5 on S1. Vertebrae: Osteopenia. Posterior fixation L2-L5. Redemonstrated subacute fracture through the L3 vertebral body. No additional acute fracture. No evidence of osteomyelitis. Interval removal of the bilateral L1 screws and left L2-L5 screws. Tracts from prior S1 screws. There remains solid osseous fusion of L1-S1. Postsurgical changes are again noted in both iliac wings consistent with bone graft donor sites. Paraspinal and other soft tissues: Increased air and fluid in the posterior soft tissues, not unexpected in the setting of recent surgery. Vascular  calcifications. Diverticulosis without diverticulitis. Disc levels: Unchanged appearance of the lumbar disc spaces, osseous spinal canal, and neural foramina, without significant stenosis. IMPRESSION: 1. Interval removal of the bilateral L1 screws and left L2-L5 screws. 2. Redemonstrated subacute fracture through the L3 vertebral body. No additional acute fracture. No evidence of osteomyelitis. 3. Increased air and fluid in the posterior soft tissues, not unexpected in the setting of recent surgery. Electronically Signed   By: Wiliam Ke M.D.   On: 08/19/2022 17:06   CT Angio Chest Pulmonary Embolism (PE) W or WO Contrast  Result Date: 08/19/2022 CLINICAL DATA:  High probability for PE. Low back pain. Infection suspected. EXAM: CT ANGIOGRAPHY CHEST WITH CONTRAST TECHNIQUE: Multidetector CT imaging of the chest was performed using the standard protocol during bolus administration of intravenous contrast. Multiplanar CT image reconstructions and MIPs were obtained to evaluate the vascular anatomy. RADIATION DOSE REDUCTION: This exam was performed according to the departmental dose-optimization program which includes automated exposure control, adjustment of the mA and/or kV according to patient size and/or use of iterative reconstruction technique. CONTRAST:  10mL OMNIPAQUE IOHEXOL 350 MG/ML SOLN COMPARISON:  Chest x-ray 08/18/2022 FINDINGS: Cardiovascular: Heart is mildly enlarged. Aorta is normal in size. There are atherosclerotic calcifications of the aorta. There is adequate opacification of the pulmonary arteries to the segmental level. There is no evidence for pulmonary embolism. Mediastinum/Nodes: There is an enlarged right hilar lymph node measuring 12 mm short axis. There are additional nonenlarged hilar and mediastinal lymph nodes. The esophagus and visualized thyroid gland are within normal limits. Lungs/Pleura: There are small bilateral pleural effusions, right greater than left. Multifocal patchy  airspace and ground-glass opacities are seen throughout the right middle lobe, right upper lobe and minimally in the left upper lobe. There is also small amount of patchy airspace disease in the bilateral lower lobes. There is central peribronchial wall thickening bilaterally. No pneumothorax. Upper Abdomen: No acute abnormality. Musculoskeletal: Left shoulder arthroplasty is present. There are mildly displaced posterolateral right eighth and tenth rib fractures which appear acute or subacute. There is a nondisplaced posterolateral right ninth rib  fracture. There is dextroconvex scoliosis of the thoracolumbar spine. Review of the MIP images confirms the above findings. IMPRESSION: 1. No evidence for pulmonary embolism. 2. Small bilateral pleural effusions, right greater than left. 3. Multifocal patchy airspace and ground-glass opacities throughout the right upper lobe, right middle lobe and bilateral lower lobes worrisome for multifocal pneumonia. 4. Central peribronchial wall thickening compatible with bronchitis. 5. Right hilar lymphadenopathy, likely reactive. 6. Acute or subacute right ninth through tenth rib fractures. Aortic Atherosclerosis (ICD10-I70.0). Electronically Signed   By: Ronney Asters M.D.   On: 08/19/2022 16:29   CT HEAD WO CONTRAST (5MM)  Result Date: 08/19/2022 CLINICAL DATA:  Delirium. EXAM: CT HEAD WITHOUT CONTRAST TECHNIQUE: Contiguous axial images were obtained from the base of the skull through the vertex without intravenous contrast. RADIATION DOSE REDUCTION: This exam was performed according to the departmental dose-optimization program which includes automated exposure control, adjustment of the mA and/or kV according to patient size and/or use of iterative reconstruction technique. COMPARISON:  Head CT 02/27/2018. FINDINGS: Brain: No acute hemorrhage. Unchanged chronic small-vessel disease. Cortical gray-white differentiation is otherwise preserved. Prominence of the ventricles and  sulci within normal limits for age. No extra-axial collection. Basilar cisterns are patent. Vascular: No hyperdense vessel or unexpected calcification. Skull: No calvarial fracture or suspicious bone lesion. Skull base is unremarkable. Sinuses/Orbits: Paranasal sinuses, mastoid air cells, and middle ear cavities are well aerated. Orbits are unremarkable. Other: None. IMPRESSION: 1. No acute intracranial abnormality. 2. Unchanged chronic small-vessel disease. Electronically Signed   By: Emmit Alexanders M.D.   On: 08/19/2022 16:23   DG CHEST PORT 1 VIEW  Result Date: 08/18/2022 CLINICAL DATA:  Congestive heart failure EXAM: PORTABLE CHEST 1 VIEW COMPARISON:  Radiograph 08/18/2022 FINDINGS: Unchanged cardiomediastinal silhouette. Elevated left hemidiaphragm with left basilar subsegmental atelectasis, similar prior exam. No new airspace disease. No large effusion or evidence of pneumothorax. Skin fold overlies the right upper chest. Prior left shoulder arthroplasty. Scoliosis and spondylosis. Partially visualized lumbar spine fusion rod. IMPRESSION: Unchanged elevated left hemidiaphragm with left basilar atelectasis. No new airspace disease. Electronically Signed   By: Maurine Simmering M.D.   On: 08/18/2022 08:03   DG Chest 1 View  Result Date: 08/17/2022 CLINICAL DATA:  Fevers EXAM: PORTABLE CHEST 1 VIEW COMPARISON:  08/14/2022 FINDINGS: Cardiac shadow is stable. Postsurgical changes in the left shoulder are seen. Lungs are hypoinflated but clear. No new focal abnormality is seen. Improved aeration is noted in the bases bilaterally. IMPRESSION: No focal infiltrate noted. Electronically Signed   By: [name] M.D.   On: 08/17/2022 18:01   DG CHEST PORT 1 VIEW  Result Date: 08/14/2022 CLINICAL DATA:  Cough EXAM: PORTABLE CHEST 1 VIEW COMPARISON:  August 12, 2022 FINDINGS: Scoliotic curvature of the thoracic spine. Left retrocardiac opacity obscures the left hemidiaphragm. This finding is stable. The left hilum  is normal. The right hilum is not well assessed as a projects over the thoracic spine. Mild opacity in the medial right base is favored to represent atelectasis or vascular crowding. No pneumothorax. No other interval changes or acute abnormalities. IMPRESSION: 1. The right hilum is not well assessed as it projects over the thoracic spine. Recommend a PA and lateral chest x-ray when the patient is able. 2. Left retrocardiac opacity is stable. This finding could represent atelectasis or infiltrate. 3. No other acute abnormalities. Electronically Signed   By: Dorise Bullion III M.D.   On: 08/14/2022 15:54   VAS [place] LOWER EXTREMITY VENOUS (DVT)  Result Date: 08/12/2022  Lower Venous DVT Study Patient Name:  [name] Faller  Date of Exam:   08/12/2022 Medical Rec #: [id number]            Accession #:    [id number] Date of Birth: February 09, 1956            Patient Gender: F Patient Age:   69 years Exam Location:  [facility] Procedure:      VAS [place] LOWER EXTREMITY VENOUS (DVT) Referring Phys: Emelda Brothers --------------------------------------------------------------------------------  Indications: Immobility s/p two lumbar surgeries, edema.  Comparison Study: No prior studies. Performing Technologist: Darlin Coco RDMS, RVT  Examination Guidelines: A complete evaluation includes B-mode imaging, spectral Doppler, color Doppler, and power Doppler as needed of all accessible portions of each vessel. Bilateral testing is considered an integral part of a complete examination. Limited examinations for reoccurring indications may be performed as noted. The reflux portion of the exam is performed with the patient in reverse Trendelenburg.  +---------+---------------+---------+-----------+----------+--------------+ RIGHT    CompressibilityPhasicitySpontaneityPropertiesThrombus Aging +---------+---------------+---------+-----------+----------+--------------+ CFV      Full           Yes      Yes                                  +---------+---------------+---------+-----------+----------+--------------+ SFJ      Full                                                        +---------+---------------+---------+-----------+----------+--------------+ FV Prox  Full                                                        +---------+---------------+---------+-----------+----------+--------------+ FV Mid   Full                                                        +---------+---------------+---------+-----------+----------+--------------+ FV DistalFull                                                        +---------+---------------+---------+-----------+----------+--------------+ PFV      Full                                                        +---------+---------------+---------+-----------+----------+--------------+ POP      Full           Yes      Yes                                 +---------+---------------+---------+-----------+----------+--------------+ PTV  Full                                                        +---------+---------------+---------+-----------+----------+--------------+ PERO     Full                                                        +---------+---------------+---------+-----------+----------+--------------+   +---------+---------------+---------+-----------+----------+--------------+ LEFT     CompressibilityPhasicitySpontaneityPropertiesThrombus Aging +---------+---------------+---------+-----------+----------+--------------+ CFV      Full           Yes      Yes                                 +---------+---------------+---------+-----------+----------+--------------+ SFJ      Full                                                        +---------+---------------+---------+-----------+----------+--------------+ FV Prox  Full                                                         +---------+---------------+---------+-----------+----------+--------------+ FV Mid   Full                                                        +---------+---------------+---------+-----------+----------+--------------+ FV DistalFull                                                        +---------+---------------+---------+-----------+----------+--------------+ PFV      Full                                                        +---------+---------------+---------+-----------+----------+--------------+ POP      Full           Yes      Yes                                 +---------+---------------+---------+-----------+----------+--------------+ PTV      Full                                                        +---------+---------------+---------+-----------+----------+--------------+  PERO     Full                                                        +---------+---------------+---------+-----------+----------+--------------+     Summary: RIGHT: - There is no evidence of deep vein thrombosis in the lower extremity.  - No cystic structure found in the popliteal fossa.  LEFT: - There is no evidence of deep vein thrombosis in the lower extremity.  - A 3.8 x 1.0 cm heterogenous, hypoechoic collection is found in the popliteal fossa.  *See table(s) above for measurements and observations. Electronically signed by Sherald Hess MD on 08/12/2022 at 2:28:32 PM.    Final    DG Chest 1 View  Result Date: 08/12/2022 CLINICAL DATA:  Cough.  Recent back surgery. EXAM: CHEST  1 VIEW COMPARISON:  02/27/2018. FINDINGS: Low lung volumes accentuate the pulmonary vasculature and cardiomediastinal silhouette. Stable elevation of the left hemidiaphragm. Slightly increased retrocardiac opacity, most likely representing atelectasis. No pleural effusion or pneumothorax. Stable cardiac and mediastinal contours. Left shoulder hemiarthroplasty. Partially visualized surgical drain along  the thoracic and upper lumbar spine. IMPRESSION: Stable elevation of the left hemidiaphragm with slightly increased retrocardiac opacity, most likely representing atelectasis. Electronically Signed   By: Orvan Falconer M.D.   On: 08/12/2022 14:01   CT LUMBAR SPINE WO CONTRAST  Result Date: 08/06/2022 CLINICAL DATA:  Closed burst fracture of lumbar vertebra. Surgery scheduled. Wheelchair-bound patient. EXAM: CT LUMBAR SPINE WITHOUT CONTRAST TECHNIQUE: Multidetector CT imaging of the lumbar spine was performed without intravenous contrast administration. Multiplanar CT image reconstructions were also generated. RADIATION DOSE REDUCTION: This exam was performed according to the departmental dose-optimization program which includes automated exposure control, adjustment of the mA and/or kV according to patient size and/or use of iterative reconstruction technique. COMPARISON:  Prior CTs 07/10/2022 and 07/06/2022. FINDINGS: Segmentation: There are 5 lumbar type vertebral bodies. Alignment: Severe convex left scoliosis centered at L1 appears similar, measuring approximately 47 degrees. There is a chronic fused anterolisthesis at L5-S1. Vertebrae: Previous bilateral pedicle screw and rod fixation from L1 through L5 with solid interbody fusion/ankylosis from T12 through the upper sacrum. The hardware is unchanged, without loosening. As before, there is penetration of the left-sided screws into the spinal canal at L2, L3 and L4, unchanged. The L4 screw extends near the middle of the spinal canal. The subacute transverse fracture through the L3 vertebral body demonstrates increased sclerosis, but no progressive loss of disc height or osseous retropulsion. No new fractures are identified. The bones are severely demineralized. Postsurgical changes are present in both iliac wings consistent with bone graft donor sites. No evidence of osteomyelitis. Paraspinal and other soft tissues: No significant anterior paraspinal  abnormalities are identified. There is diffuse diverticulosis of the distal colon with mild presacral edema, unchanged. Persistent soft tissue gas posteriorly around the spinal fixation hardware without evidence of significant fluid collection. Disc levels: The lumbar disc spaces appear stable without significant spinal stenosis or foraminal narrowing. Prominent endplate osteophytes and vacuum phenomena known again noted at T11-12. IMPRESSION: 1. Stable appearance of the lumbar spine status post L1 through L5 fusion. The hardware is unchanged in position without loosening. 2. The subacute transverse fracture through the L3 vertebral body demonstrates increased sclerosis, but no progressive loss of disc height or osseous retropulsion. No new fractures are  identified. 3. Persistent soft tissue gas posteriorly around the spinal fixation hardware without evidence of significant fluid collection. 4. Stable severe convex left scoliosis, multilevel spondylosis and multilevel interbody ankylosis. Electronically Signed   By: Carey Bullocks M.D.   On: 08/06/2022 16:40    I spent 60 minutes for this patient encounter including review of prior medical records, coordination of care with primary/other specialist with greater than 50% of time being face to face/counseling and discussing diagnostics/treatment plan with the patient/family.  Electronically signed by:   Odette Fraction, MD Infectious Disease Physician [facility] for Infectious Disease Pager: [phone]

## 2022-09-01 NOTE — Progress Notes (Signed)
NAME:  [name], MRN:  [id number], DOB:  Dec 31, 1955, LOS: 78 ADMISSION DATE:  08/09/2022, CONSULTATION DATE:  08/19/2022 REFERRING MD:  Darrick Meigs [facility]), CHIEF COMPLAINT:  Dyspnea, encephalopathy  History of Present Illness:  67 yo F PMH mild cognitive decline, bipolar disorder, recurrent UTIs, scoliosis with prior lumbar fusion who was admitted 1/22 for complex lumbar wound/visible lumbar spine hardware.   Has been admitted twice recently prior to current admission -- first for eval of back pain found to be unstable fracture at prior lumbar fusion site requiring PLIF L1-L5 on 12/9. Discharged to [facility] 12/21.  At SNF, wound dehisced and pt presented back to ED same day and was readmitted 12/21-12/29 (second admission), then dc back to SNF.  Her complex lumbar wound further deteriorated at SNF and she presented back to ED 1/22 with exposed lumbar hardware and was admitted (current admission).  She went to OR 1/22 for revision of L1-L5 fusion, removal of left L1-L5 screws and rod, and removal of right L1 screw, revision of L1-L5 posterior instrumented fusion.  She has been on doxy starting 1/24.  Current admission course has been complicated by encephalopathy which initially seemed 2/2 polypharmacy, then thought possible UTI, and Cefadroxil was added 1/26.  CNS depressing medications were also de-escalated.  Despite these measures, Pt become increasingly encephalopathic.  TRH was consulted 1/30 in this setting. She started spiking low grade temps 1/30 (100.7), and became febrile to 103 on 1/31, at which point abx were changed to vanc and zosyn.  Concomitantly, pt has had progressively worsening dyspnea and tachycardia.  CT lumbar spine did not show any surgical site infection.  [name] and ID were consulted on 2/1 in this setting.  MR brain concerning for ventriculitis or meningitis and continued on empiric treatment.  CTA chest concerning for PNA, continued on metronidazole for aspiration PNA.  Pertinent   Medical History  - Bipolar disorder - Mild cog impairment  - Recurrent UTI - Complex Lumbar spine Hx as noted above - T2DM - GERD  Significant Hospital Events: Including procedures, antibiotic start and stop dates in addition to other pertinent events  12/9 - PLIF for unstable L fracture through prior L spine fusion. Admitted 12/9-12/21, dc to SNF 12/21 12/21 - Readmitted 12/21-12/29 for wound dehiscence and dc back to SNF 1/22 - Readmitted for exposed L hardware; OR for removal of left L1-L5 screws, rod and right L1 screw. 1/22 - Started doxycycline. 1/23 - Encephalopathic 1/25 - Started ciprofloxacin 1/26 - Stopped ciprofloxacin, started cefadroxil for possible UTI  1/30 - New leukocytosis, ongoing confusion, fever; TRH consult 1/31 - Worse fever and confusion; abx change to vanc and zosyn 2/1 - Dyspnea, tachycardia. WBC 17.5.  [name] and ID consults. Abx changed to vanc, cefepime, and metronidazole. Admitted to ICU.  Hypoxemic, intubated.  CTA chest concerning for aspiration PNA. 2/2 - MR brain concerning for meningitis, continue empiric treatment w/ vanc and cefepime 2/6 tracheostomy [place]/RB  Interim History / Subjective:   Awake alert following commands Met with family at bedside   Objective   Blood pressure (!) 160/92, pulse 94, temperature 98.6 F (37 C), temperature source Axillary, resp. rate 15, height 5' (1.524 m), weight 67.6 kg, SpO2 100 %.    FiO2 (%):  [28 %] 28 %   Intake/Output Summary (Last 24 hours) at 09/01/2022 0753 Last data filed at 09/01/2022 0600 Gross per 24 hour  Intake 2260.81 ml  Output 3610 ml  Net -1349.19 ml   [employer]  08/06/22 1541 08/09/22 0931  Weight: 65.3 kg 67.6 kg     General: chronically ill appearing fm, awake, in bed  Eyes - tracks appropriately  ENT- strong voice, good cough, trach in place  Cardiac - RRR, s1 s2  Chest - BL breaths, no rhonchi Abdomen - soft nt nd  Extremities - no edema  Skin - no rash  Neuro - alert  following commands, moves extremities, weak   Resolved Problems:  Sepsis, Acute metabolic encephalopathy 2nd to sepsis, Acute hypoxic respiratory failure, Aspiration pneumonitis, VT likely from hypokalemia  Assessment & Plan:   Meningitis/ventriculitis in 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 P: -As per ID recs, plan for 6 weeks of antibiotics, currently on vancomycin, cefepime, Flagyl - continue abx regimen per ID - family would like to speak with ID. I have sent them a message   Chronic respiratory failure, s/p trach  - S/p tracheostomy 2/06 P: - continue routine trach care  - continue pmv and advancement of diet  - ? CIR candidate - remove sutures from trach   Hyponatremia with hypochloremia. - continue salt tabs   Hypokalemia. -Continue to replete, goal 4   Anemia of critical illness. -Follow intermittent CBC, hemoglobin goal 7.0  DM type 2. -CBG + SSI    Chronic combined CHF, CAD, HTN. -Continue carvedilol -Dosing diuretics daily to maintain adequate volume management.  None planned today 2/13  Urine retention. -Foley in place - voiding trial will be needed - will discuss with nursing   Hx of Bipolar, mild cognitive deficit. -Plan to continue: aricept, neurontin, lamitcal and trazadone  Hx of hypothyroidism. -Continue levothyroxine   Moderate malnutrition. -TF as ordered  Debility -Working with PT/OT/SLP   Best Practice (right click and "Reselect all SmartList Selections" daily)   Diet/type: tubefeeds DVT prophylaxis: prophylactic heparin  GI prophylaxis: H2B Lines: Central line Foley:  N/A Code Status:  full code Last date of multidisciplinary goals of care discussion:   Labs:      Latest Ref Rng & Units 08/31/2022    5:46 AM 08/30/2022    3:58 AM 08/29/2022    3:55 AM  CMP  Glucose 70 - 99 mg/dL 109  124  325   BUN 8 - 23 mg/dL 9  7  9   $ Creatinine 0.44 - 1.00 mg/dL <0.30  <0.30  <0.30   Sodium 135 - 145 mmol/L 130   132  125   Potassium 3.5 - 5.1 mmol/L 3.7  3.7  3.4   Chloride 98 - 111 mmol/L 94  98  87   CO2 22 - 32 mmol/L 26  27  28   $ Calcium 8.9 - 10.3 mg/dL 8.1  7.6  7.7   Total Protein 6.5 - 8.1 g/dL 5.0     Total Bilirubin 0.3 - 1.2 mg/dL 0.5     Alkaline Phos 38 - 126 U/L 60     AST 15 - 41 U/L 21     ALT 0 - 44 U/L 13          Latest Ref Rng & Units 08/31/2022    5:46 AM 08/30/2022    3:58 AM 08/29/2022    3:55 AM  CBC  WBC 4.0 - 10.5 K/uL 8.8  8.6  9.3   Hemoglobin 12.0 - 15.0 g/dL 8.6  8.5  8.8   Hematocrit 36.0 - 46.0 % 27.1  25.9  26.4   Platelets 150 - 400 K/uL 588  500  548  CBG (last 3)  Recent Labs    08/31/22 1920 08/31/22 2308 09/01/22 0321  GLUCAP 113* 123* 110*     Garner Nash, DO [facility] 09/01/2022 7:54 AM

## 2022-09-01 NOTE — Consult Note (Signed)
[facility] Nurse wound follow up Wound type: lumbar wound dehiscence, NPWT dressing change Measurement: 13 cm suture line with open areas at the proximal and distal end, 0.2 cm open area near center of wound Wound bed: 10% fibrin slough (distal end) 90% pale pink nongranulating Drainage (amount, consistency, odor) minimal serosanguinous  Periwound: bruising at proximal end due to [facility] pad, will rotate to distal end.  Still does not want dressing bridged to flank  Dressing procedure/placement/frequency: Removed old dressing, cleansed skin.  Barrier ring to periwound.  1 piece black foam to incision line. Covered with drape. Change Mon.Wed.Fri.  Will follow.   Estrellita Ludwig MSN, RN, FNP-BC CWON Wound, Ostomy, Continence Nurse [facility] [phone] Pager [phone]

## 2022-09-01 NOTE — Progress Notes (Signed)
Patient transferred from [place] ICU. Family at bedside . Patients vital signs stable. No distress noted.

## 2022-09-01 NOTE — Progress Notes (Signed)
Pharmacy Antibiotic Note  [name] is a 67 y.o. 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 Pharmacy has been consulted for Vancomycin + Cefepime dosing.   Vancomycin trough is therapeutic at 16 mcg/mL.  Renal function stable.  Afebrile, WBC WNL.  Plan: Continue vanc 1241m IV Q12H Continue Cefepime 2g IV Q8H and Flagyl 5028mIV Q8H Monitor renal function, culture results, LOT, and antibiotic de-escalation plans   Height: 5' (152.4 cm) Weight:  (Bedscale not working) IBW/kg (Calculated) : 45.5  Temp (24hrs), Avg:98.1 F (36.7 C), Min:97.7 F (36.5 C), Max:98.8 F (37.1 C)  Recent Labs  Lab 08/26/22 0500 08/26/22 0731 08/27/22 1946 08/27/22 2354 08/28/22 0450 08/29/22 0355 08/30/22 0358 08/30/22 1139 08/31/22 0546  WBC 11.3*  --   --   --  10.4 9.3 8.6  --  8.8  CREATININE <0.30*   < >  --  <0.30* <0.30* <0.30* <0.30*  --  <0.30*  VANCOTROUGH  --   --  18  --   --   --   --  20  --    < > = values in this interval not displayed.     CrCl cannot be calculated (This lab value cannot be used to calculate CrCl because it is not a number: <0.30).    No Known Allergies  Cipro 1/25 >>1/26  Doxy 1/23 >>1/31 Cefadroxil 1/26 >>1/31 Vanc 1/31>> Zosyn 1/31>>2/1 Cefepime 2/1 >> Flagyl 2/1 >>  2/5 VT = 7 on 75038m12 >> 1000m51m 2/6 VT = 15 on 1g q8 2/9 VT = 18 continue current regimen 2/12 VT = 20 >> change to 1250mg97m 2/14 VT = 16 mcg/mL >> no change  1/22 MRSA PCR: neg;  SA: positive 1/28 C.diff - negative 1/30 BCx x2: neg 1/30 BCx x2: neg 2/2 Resp Cx: rare yeast w/ pseudohyphae (candida alb) 2/6 BCx - NGTD 2/6 TA - C.albicans and mold (CCM not concerned with mold)  Peder Allums D. Delphina Schum,Mina MarblermD, BCPS, BCCCPWentzville/2024, 1:40 PM

## 2022-09-01 NOTE — Progress Notes (Signed)
Occupational Therapy Treatment Patient Details Name: RHENDA MCNELIS MRN: [id number] DOB: Jun 06, 1956 Today's Date: 09/01/2022   History of present illness Patient is a 67 y/o female who presents on 1/22 for revision of complex lumbar wound, removal of left L1-L5 lumbar screws and rod, removal of right L1 screw and revision of L1-L5 posterior instrumented fusion. 1/30 pt febrile and more lethargic in AM, MD ordered CXR. Pt s/p trach on 2/6. PMH includes DM, HTN, anxiety, left TSA, arthritis. Current admission course has been complicated by encephalopathywhich initially seemed 2/2 polypharmacy, then thought possible UTI, and Cefadroxil was added 1/26.  CNS depressing medications were also de-escalated.  Despite these measures, Pt become increasingly encephalopathic.2/1: Admitted to ICU and intubated. 2/2: MR brain concerning for meningitis. 2/6: Pt was extubated although re-intubated due to fatigue and airway protection.   OT comments  Patient engaged in bed mobility with mod to max assist +2, transfers to recliner using steady with max assist +2 due to weakness and max cueing/support to maintain upright position of trunk.  She continues to require encouragement and repetition of purpose/task being performed due to cognitive deficits.  Requires min-total assist +2 for ADLs.  Encouraged L UE elevation and hand pumps.  Will follow acutely.    Recommendations for follow up therapy are one component of a multi-disciplinary discharge planning process, led by the attending physician.  Recommendations may be updated based on patient status, additional functional criteria and insurance authorization.    Follow Up Recommendations  Acute inpatient rehab (3hours/day)     Assistance Recommended at Discharge Frequent or constant Supervision/Assistance  Patient can return home with the following  Two people to help with walking and/or transfers;Two people to help with bathing/dressing/bathroom;Assist for  transportation;Help with stairs or ramp for entrance;Direct supervision/assist for medications management;Direct supervision/assist for financial management;Assistance with cooking/housework   Equipment Recommendations  Other (comment) (defer)    Recommendations for Other Services Rehab consult    Precautions / Restrictions Precautions Precautions: Fall;Back Precaution Booklet Issued: Yes (comment) Precaution Comments: trach, cortrack, flexiseal, wound vac on back, using PMSV Other Brace: prevlon boots Restrictions Weight Bearing Restrictions: No       Mobility Bed Mobility Overal bed mobility: Needs Assistance Bed Mobility: Rolling, Sidelying to Sit Rolling: Mod assist, +2 for physical assistance, +2 for safety/equipment Sidelying to sit: Mod assist, +2 for physical assistance       General bed mobility comments: using UE's some on rail to assist to roll, assist for legs off bed and to lift trunk    Transfers Overall transfer level: Needs assistance Equipment used: Ambulation equipment used Transfers: Bed to chair/wheelchair/BSC, Sit to/from Stand Sit to Stand: Max assist, +2 physical assistance, +2 safety/equipment           General transfer comment: stood with lift from bed pad under her to stedy, but flexed posture throughout with UE weakness needing support anteriorly to prevent collapse onto stedy.  Moved to chair dependent in [place] Transfer via Lift Equipment: [employer] Overall balance assessment: Needs assistance Sitting-balance support: Feet supported, No upper extremity supported, Bilateral upper extremity supported Sitting balance-Leahy Scale: Poor Sitting balance - Comments: balance briefly with trunk stacked on hips, but needing support for dynamic tasks and once fatigued     Standing balance-Leahy Scale: Zero Standing balance comment: +2 for partial standing attempt                           ADL  either performed or assessed with  clinical judgement   ADL Overall ADL's : Needs assistance/impaired                     Lower Body Dressing: Total assistance;+2 for physical assistance;+2 for safety/equipment;Sit to/from stand   Toilet Transfer: +2 for physical assistance;+2 for safety/equipment;Maximal assistance Toilet Transfer Details (indicate cue type and reason): using stedy         Functional mobility during ADLs: Maximal assistance;+2 for physical assistance;+2 for safety/equipment General ADL Comments: pt easily distracted, completed transfer so she could visit with her son    Extremity/Trunk Assessment              Vision       Perception     Praxis      Cognition Arousal/Alertness: Awake/alert Behavior During Therapy: Anxious Overall Cognitive Status: Impaired/Different from baseline Area of Impairment: Attention, Memory, Following commands, Safety/judgement, Problem solving, Awareness                   Current Attention Level: Focused Memory: Decreased recall of precautions, Decreased short-term memory Following Commands: Follows one step commands with increased time Safety/Judgement: Decreased awareness of safety, Decreased awareness of deficits Awareness: Intellectual Problem Solving: Slow processing, Decreased initiation, Difficulty sequencing, Requires verbal cues, Requires tactile cues General Comments: pt frequently repeats "now what are we doing" "I wish someone would tell me" after she was educated on plan.  Follows simple commands with increased time.        Exercises      Shoulder Instructions       General Comments VSS on TC 28% w ith son in room and supportive. Encouraged pt to keep L UE elevated due to edema, complete hand pumps.    Pertinent Vitals/ Pain       Pain Assessment Pain Assessment: Faces Faces Pain Scale: Hurts little more Pain Location: back with mobility Pain Descriptors / Indicators: Grimacing, Guarding, Discomfort Pain  Intervention(s): Limited activity within patient's tolerance, Monitored during session, Repositioned  Home Living                                          Prior Functioning/Environment              Frequency  Min 3X/week        Progress Toward Goals  OT Goals(current goals can now be found in the care plan section)  Progress towards OT goals: Progressing toward goals  Acute Rehab OT Goals Patient Stated Goal: none stated Time For Goal Achievement: 09/07/22 Potential to Achieve Goals: [place] Discharge plan remains appropriate;Frequency remains appropriate    Co-evaluation    PT/OT/SLP Co-Evaluation/Treatment: Yes Reason for Co-Treatment: For patient/therapist safety;To address functional/ADL transfers;Complexity of the patient's impairments (multi-system involvement) PT goals addressed during session: Mobility/safety with mobility;Balance;Proper use of DME OT goals addressed during session: ADL's and self-care      AM-PAC OT "6 Clicks" Daily Activity     Outcome Measure   Help from another person eating meals?: A Lot Help from another person taking care of personal grooming?: A Lot Help from another person toileting, which includes using toliet, bedpan, or urinal?: Total Help from another person bathing (including washing, rinsing, drying)?: A Lot Help from another person to put on and taking off regular upper body clothing?: A Lot Help from another person to put  on and taking off regular lower body clothing?: Total 6 Click Score: 10    End of Session Equipment Utilized During Treatment: Oxygen  OT Visit Diagnosis: Muscle weakness (generalized) (M62.81);Repeated falls (R29.6)   Activity Tolerance Patient tolerated treatment well   Patient Left in chair;with call bell/phone within reach;with chair alarm set;with family/visitor present   Nurse Communication Mobility status        Time: [id number] OT Time Calculation (min): 32  min  Charges: OT General Charges $OT Visit: 1 Visit OT Treatments $Self Care/Home Management : 8-22 mins  Jolaine Artist, [place] Office [phone]   Delight Stare 09/01/2022, 1:53 PM

## 2022-09-01 NOTE — Progress Notes (Signed)
Neurosurgery Service Progress Note  Subjective: NAE ON, no further desat / suctioning issues   Objective: Vitals:   09/01/22 0500 09/01/22 0600 09/01/22 0800 09/01/22 0818  BP: (!) 160/94 (!) 160/92    Pulse: 87 94  88  Resp: 14 15  15  $ Temp:   97.8 F (36.6 C)   TempSrc:   Oral   SpO2: 99% 100%  100%  Weight:      Height:        Physical Exam: TC with speaking valve, interactive, remembers some of our conversation from yesterday but family is at bedside and saying she's having some symptoms c/w psychomotor agitation and poor short term memory, PERRL, gaze conjugate, Fcx4 with deconditioning, wound vac in place with minimal output  Assessment & Plan: 67 y.o. female s/p revision of complex lumbar wound, removal of left L1-L5 lumbar screws and rod, removal of right L1 screw, revision of L1-L5 posterior instrumented fusion. Complicated post-operative course   -reviewed the MRI, a bit of an odd pattern for ventriculitis given how focal it is - no meningeal irritation, only focal enhancement of the ependyma. Safest to treat as ventriculitis, clinically she's doing well, will d/w ID re repeating the MRI in 4 weeks prior to stopping Abx to make sure it resolves -transfer out of the unit to stepdown, discussed w/ family I think this will help some with mental status -will re-c/s medicine for assistance with the rest of her care after transfer -will d/w medicine, good suggestion by Maudie Mercury that we should consider switching to nocturnal feeds to help with appetite during the day   Judith Part, MD

## 2022-09-01 NOTE — Progress Notes (Signed)
Inpatient Rehabilitation Admissions Coordinator   I met at bedside with patient and son today. Patient was up in chair alert, talkative and participating well. Hopeful to show more progress with therapies to then be able to  begin insurance Auth with [facility] for a possible CIR admit.  Danne Baxter, RN, MSN Rehab Admissions Coordinator [phone] 09/01/2022 5:33 PM

## 2022-09-01 NOTE — Progress Notes (Signed)
Per CCM verbal order was given to RT, to remove trach sutures.

## 2022-09-01 NOTE — Progress Notes (Signed)
Physical Therapy Treatment Patient Details Name: [name] MRN: [id number] DOB: 1955-10-29 Today's Date: 09/01/2022   History of Present Illness Patient is a 67 y/o female who presents on 1/22 for revision of complex lumbar wound, removal of left L1-L5 lumbar screws and rod, removal of right L1 screw and revision of L1-L5 posterior instrumented fusion. 1/30 pt febrile and more lethargic in AM, MD ordered CXR. Pt s/p trach on 2/6. PMH includes DM, HTN, anxiety, left TSA, arthritis. Current admission course has been complicated by encephalopathywhich initially seemed 2/2 polypharmacy, then thought possible UTI, and Cefadroxil was added 1/26.  CNS depressing medications were also de-escalated.  Despite these measures, Pt become increasingly encephalopathic.2/1: Admitted to ICU and intubated. 2/2: MR brain concerning for meningitis. 2/6: Pt was extubated although re-intubated due to fatigue and airway protection.    PT Comments    Patient able to achieve OOB today via [name] lift though poorly tolerated and with collapse anteriorly of trunk needing A for postural stability.  She was able to sit EOB briefly propped with shoulders over hips and without external support.  Son present and encouraging/supportive and giving ideas for increased pt engagement.  She remains appropriate for AIR prior to d/c home with family support.  Will initiate HEP next session.    Recommendations for follow up therapy are one component of a multi-disciplinary discharge planning process, led by the attending physician.  Recommendations may be updated based on patient status, additional functional criteria and insurance authorization.  Follow Up Recommendations  Acute inpatient rehab (3hours/day) Can patient physically be transported by private vehicle: No   Assistance Recommended at Discharge Frequent or constant Supervision/Assistance  Patient can return home with the following Two people to help with walking and/or  transfers;A lot of help with bathing/dressing/bathroom;Assist for transportation;Help with stairs or ramp for entrance;Direct supervision/assist for medications management;Assistance with cooking/housework   Equipment Recommendations  None recommended by PT    Recommendations for Other Services       Precautions / Restrictions Precautions Precautions: Fall;Back Precaution Comments: trach, cortrack, flexiseal, wound vac on back, using PMSV Other Brace: prevlon boots     Mobility  Bed Mobility Overal bed mobility: Needs Assistance Bed Mobility: Rolling, Sidelying to Sit   Sidelying to sit: Mod assist, +2 for physical assistance Supine to sit: Max assist, +2 for physical assistance     General bed mobility comments: using UE's some on rail to assist to roll, assist for legs off bed and to lift trunk    Transfers Overall transfer level: Needs assistance   Transfers: Bed to chair/wheelchair/BSC, Sit to/from Stand Sit to Stand: +2 physical assistance, Max assist, From elevated surface           General transfer comment: stood with lift from bed pad under her to stedy, but flexed posture throughout with UE weakness needing support anteriorly to prevent collapse onto stedy.  Moved to chair dependent in [place] Transfer via Lift Equipment: [name]  Ambulation/Gait                   Stairs             Wheelchair Mobility    Modified Rankin (Stroke Patients Only)       Balance Overall balance assessment: Needs assistance   Sitting balance-Leahy Scale: Poor Sitting balance - Comments: balance briefly with trunk stacked on hips, but needing support for dynamic tasks and once fatigued     Standing balance-Leahy Scale: Zero Standing balance comment: +  2 for partial standing attempt                            Cognition Arousal/Alertness: Awake/alert Behavior During Therapy: Anxious Overall Cognitive Status: Impaired/Different from baseline Area  of Impairment: Attention, Memory, Following commands, Safety/judgement, Problem solving, Awareness                   Current Attention Level: Focused Memory: Decreased recall of precautions, Decreased short-term memory Following Commands: Follows one step commands with increased time Safety/Judgement: Decreased awareness of safety, Decreased awareness of deficits   Problem Solving: Slow processing, Decreased initiation, Difficulty sequencing, Requires verbal cues, Requires tactile cues          Exercises      General Comments General comments (skin integrity, edema, etc.): VSS on trach collar 28%; son in the room and supportive, encouraging participation and educating staff about pt's hobbies (backgammon and painting)      Pertinent Vitals/Pain Pain Assessment Faces Pain Scale: Hurts little more Pain Location: back with mobility Pain Descriptors / Indicators: Grimacing, Guarding, Discomfort Pain Intervention(s): Monitored during session, Premedicated before session, Repositioned    Home Living                          Prior Function            PT Goals (current goals can now be found in the care plan section) Progress towards PT goals: Progressing toward goals    Frequency    Min 4X/week      PT Plan Current plan remains appropriate    Co-evaluation PT/OT/SLP Co-Evaluation/Treatment: Yes Reason for Co-Treatment: Complexity of the patient's impairments (multi-system involvement);To address functional/ADL transfers;For patient/therapist safety PT goals addressed during session: Mobility/safety with mobility;Balance;Proper use of DME        AM-PAC PT "6 Clicks" Mobility   Outcome Measure  Help needed turning from your back to your side while in a flat bed without using bedrails?: Total Help needed moving from lying on your back to sitting on the side of a flat bed without using bedrails?: Total Help needed moving to and from a bed to a chair  (including a wheelchair)?: Total Help needed standing up from a chair using your arms (e.g., wheelchair or bedside chair)?: Total Help needed to walk in hospital room?: Total Help needed climbing 3-5 steps with a railing? : Total 6 Click Score: 6    End of Session Equipment Utilized During Treatment: Oxygen Activity Tolerance: Patient limited by fatigue Patient left: in chair;with call bell/phone within reach;with chair alarm set;with family/visitor present Nurse Communication: Mobility status;Need for lift equipment PT Visit Diagnosis: Pain;Muscle weakness (generalized) (M62.81);Difficulty in walking, not elsewhere classified (R26.2) Pain - part of body:  (back)     Time: [id number] PT Time Calculation (min) (ACUTE ONLY): 36 min  Charges:  $Therapeutic Activity: 8-22 mins                     Magda Kiel, PT Acute Rehabilitation Services [phone] 09/01/2022    Reginia Naas 09/01/2022, 1:19 PM

## 2022-09-01 NOTE — Consult Note (Addendum)
Medical Consultation   [name]  [id number]  DOB: 09-Aug-1955  DOA: 08/09/2022  PCP: [name], MD    Requesting physician: Dr Emelda Brothers   Reason for consultation:Medical management as patient coming out of ICU    History of Present Illness: [name] is an 67 y.o. female with history of HTN, DM, chronic CHF with EF 45-50%, nonobstructive CAD, small VSD, anxiety/mild cognitive impairment on Aricept, left TSA, arthritis has had a prolonged complicated hospital course.  Patient initially admitted from SNF on 08/09/2022 for wound dehiscence with visible lumbar hardware (had undergone spinal surgery with large segment scoliotic construct in December followed by wound infection which was treated and sent to SNF).  Patient underwent revision lumbar spine surgery with removal of screws and rods involving L1-L5 on 1/22.  Medical team was consulted on 1/30 for fevers and AMS-felt secondary to UTI versus polypharmacy versus hyponatremia.  Medications were adjusted and patient treated with cephalosporins for UTI while awaiting urine cultures.  Patient was  seen by wound care nurse for and wound VAC placed on 1/31.  Patient was seen by ID on 2/1 for persistent fevers and unclear origin.  Antibiotics were broadened to include IV vancomycin, cefepime and metronidazole--further workup including CTA of the chest was ordered revealing aspiration pneumonitis.   Patient became increasingly encephalopathic later that day requiring intubation and transferred to ICU.  Patient was extubated although had to be reintubated on 2/6 for airway protection again due to lethargy.  Patient is now s/p trach and has trach collar in place.  Infectious diseases has been following along with antibiotic management for UTI/ventriculitis. Per Dr. [name], patient has been awake and alert, initiated on p.o. feeds.  She however has been having some periods of confusion/agitation in the  evenings.  He has requested medical team to be back on board for management of acute and chronic medical issues while she is being transferred out of ICU.  Patient awake alert and oriented x 3 when seen in her room.  She denies any acute complaints like pain or fevers or headache.  She is trying to work with PT.  She still has NG tube/Foley catheter/rectal tube in place.  Son is at bedside and states she is progressing well overall.      Review of Systems:  ROS As per HPI otherwise 10 point review of systems negative.     Past Medical History: Past Medical History:  Diagnosis Date   Anemia    Anxiety    Arthritis    Depression    GERD (gastroesophageal reflux disease)    Heart murmur    "related to VSD"   High cholesterol    History of blood transfusion    "related to OR" (08/19/2016)   History of hiatal hernia    Hypertension    Hyperthyroidism    Mild cognitive impairment 09/06/2018   Paroxysmal ventricular tachycardia (HCC)    Type II diabetes mellitus (HCC)    UTI (urinary tract infection)    being treated with Keflex   Ventricular septal defect     Past Surgical History: Past Surgical History:  Procedure Laterality Date   ABDOMINAL HYSTERECTOMY     BACK SURGERY     CARDIAC CATHETERIZATION N/A 06/23/2015   Procedure: Left Heart Cath and Coronary Angiography;  Surgeon: Larey Dresser, MD;  Location: [facility];  Service: Cardiovascular;  Laterality: N/A;  CARDIAC CATHETERIZATION  1960   "VSD was so small; didn't need repaired"   EXAM UNDER ANESTHESIA WITH MANIPULATION OF HIP Right 06/02/2014   dr Rhona Raider   FRACTURE SURGERY     HERNIA REPAIR     HIP CLOSED REDUCTION Right 06/02/2014   Procedure: CLOSED MANIPULATION HIP;  Surgeon: Hessie Dibble, MD;  Location: [facility];  Service: Orthopedics;  Laterality: Right;   JOINT REPLACEMENT     JOINT REPLACEMENT     POSTERIOR LUMBAR FUSION 4 LEVEL N/A 06/26/2022   Procedure: Lumbar One To Lumbar Five Posterior  Instrumented Fusion;  Surgeon: Judith Part, MD;  Location: [facility];  Service: Neurosurgery;  Laterality: N/A;   REFRACTIVE SURGERY Bilateral    SHOULDER ARTHROSCOPY Right    SHOULDER OPEN ROTATOR CUFF REPAIR Right    SPINAL FUSION  1996   "t10 down to my coccyx   SPINE HARDWARE REMOVAL     TOTAL ABDOMINAL HYSTERECTOMY     TOTAL HIP ARTHROPLASTY Right 05/10/2014   [facility]      by dr Harrell Gave [place]   TOTAL KNEE ARTHROPLASTY Left    TOTAL SHOULDER ARTHROPLASTY Left 08/19/2016   Procedure: TOTAL SHOULDER ARTHROPLASTY;  Surgeon: Tania Ade, MD;  Location: [facility];  Service: Orthopedics;  Laterality: Left;  Left total shoulder replacement     Allergies:  No Known Allergies   Social History:  reports that she has been smoking cigarettes. She has never used smokeless tobacco. She reports that she does not drink alcohol and does not use drugs.   Family History: Family History  Problem Relation Age of Onset   Other Mother        alive   Stroke Father 55       deceased   Dementia Father    Chorea Maternal Grandfather    Dementia Maternal Aunt    Dementia Maternal Aunt    Heart attack Other        multiple uncles have died with myocardial infarction       Physical Exam: Vitals:   09/01/22 0600 09/01/22 0800 09/01/22 0818 09/01/22 1141  BP: (!) 160/92     Pulse: 94  88   Resp: 15  15   Temp:  97.8 F (36.6 C)    TempSrc:  Oral    SpO2: 100%  100% 100%  Weight:      Height:        Constitutional: Alert and awake, oriented x3, not in any acute distress.  Patient is s/p trach on trach collar and able to converse well with PMV Eyes: PERLA, EOMI, irises appear normal, anicteric sclera,  ENMT: NG tube in place, external ears and nose appear normal, normal hearing  Lips appears normal, oropharynx mucosa, tongue appear normal  Neck: neck appears normal, no masses, normal ROM, no thyromegaly, no JVD  CVS: S1-S2 clear, no murmur rubs or gallops, no LE edema,  normal pedal pulses  Respiratory: It is post trach, clear to auscultation bilaterally, no wheezing, rales or rhonchi. Respiratory effort normal. No accessory muscle use.  Abdomen: soft nontender, nondistended, normal bowel sounds, no hepatosplenomegaly, no hernias  Musculoskeletal: : no cyanosis, clubbing or edema noted bilaterally. No contractures or atrophy noted Neuro: Cranial nerves II-XII intact, strength, sensation, reflexes Psych: judgement and insight appear normal, stable mood and affect currently Skin/Catheters:   No acute rashes, lumbar dehiscence wound with wound VAC, on NG tube, rectal tube and Foley  Data reviewed:  I have personally reviewed following labs  and imaging studies Labs:  CBC: Recent Labs  Lab 08/26/22 0500 08/28/22 0450 08/29/22 0355 08/30/22 0358 08/31/22 0546  WBC 11.3* 10.4 9.3 8.6 8.8  NEUTROABS 8.8* 7.7  --   --  6.3  HGB 7.5* 8.9* 8.8* 8.5* 8.6*  HCT 22.5* 27.6* 26.4* 25.9* 27.1*  MCV 100.4* 100.4* 99.2 102.0* 103.0*  PLT 548* 646* 548* 500* 588*    Basic Metabolic Panel: Recent Labs  Lab 08/26/22 0500 08/26/22 0731 08/27/22 0102 08/27/22 2354 08/28/22 0450 08/29/22 0355 08/30/22 0358 08/31/22 0546  NA 122*   < > 128* 129* 130* 125* 132* 130*  K 3.0*   < > 3.1* 4.0 3.8 3.4* 3.7 3.7  CL 90*   < > 92* 91* 91* 87* 98 94*  CO2 23   < > 28 27 29 28 27 26  $ GLUCOSE 438*   < > 104* 110* 130* 325* 124* 109*  BUN 9   < > 9 9 7* 9 7* 9  CREATININE <0.30*   < > 0.32* <0.30* <0.30* <0.30* <0.30* <0.30*  CALCIUM 7.0*   < > 7.5* 8.0* 7.9* 7.7* 7.6* 8.1*  MG 1.6*   < > 1.8 2.0 2.0  --  1.7 1.8  PHOS 3.0  --   --   --  3.5  --   --  4.4   < > = values in this interval not displayed.   GFR CrCl cannot be calculated (This lab value cannot be used to calculate CrCl because it is not a number: <0.30). Liver Function Tests: Recent Labs  Lab 08/26/22 0500 08/26/22 0731 08/31/22 0546  AST 15 20 21  $ ALT 9 12 13  $ ALKPHOS 62 63 60  BILITOT 1.3* 0.9  0.5  PROT 4.0* 3.8* 5.0*  ALBUMIN 1.6* 1.6* 2.0*   No results for input(s): "LIPASE", "AMYLASE" in the last 168 hours. No results for input(s): "AMMONIA" in the last 168 hours. Coagulation profile No results for input(s): "INR", "PROTIME" in the last 168 hours. Cardiac Enzymes: No results for input(s): "CKTOTAL", "CKMB", "CKMBINDEX", "TROPONINI" in the last 168 hours. BNP: Invalid input(s): "POCBNP" CBG: Recent Labs  Lab 08/31/22 1920 08/31/22 2308 09/01/22 0321 09/01/22 0816 09/01/22 1056  GLUCAP 113* 123* 110* 168* 148*   D-Dimer No results for input(s): "DDIMER" in the last 72 hours. Hgb A1c No results for input(s): "HGBA1C" in the last 72 hours. Lipid Profile No results for input(s): "CHOL", "HDL", "LDLCALC", "TRIG", "CHOLHDL", "LDLDIRECT" in the last 72 hours. Thyroid function studies No results for input(s): "TSH", "T4TOTAL", "T3FREE", "THYROIDAB" in the last 72 hours.  Invalid input(s): "FREET3" Anemia work up No results for input(s): "VITAMINB12", "FOLATE", "FERRITIN", "TIBC", "IRON", "RETICCTPCT" in the last 72 hours. Urinalysis    Component Value Date/Time   COLORURINE YELLOW 08/17/2022 2140   APPEARANCEUR HAZY (A) 08/17/2022 2140   LABSPEC 1.014 08/17/2022 2140   PHURINE 7.0 08/17/2022 2140   GLUCOSEU NEGATIVE 08/17/2022 2140   HGBUR NEGATIVE 08/17/2022 2140   BILIRUBINUR NEGATIVE 08/17/2022 2140   KETONESUR 20 (A) 08/17/2022 2140   PROTEINUR 30 (A) 08/17/2022 2140   UROBILINOGEN 0.2 06/02/2014 1705   NITRITE NEGATIVE 08/17/2022 2140   LEUKOCYTESUR SMALL (A) 08/17/2022 2140     Microbiology Recent Results (from the past 240 hour(s))  Culture, Respiratory w Gram Stain     Status: None   Collection Time: 08/24/22  9:07 AM   Specimen: Tracheal Aspirate; Respiratory  Result Value Ref Range Status   Specimen Description TRACHEAL ASPIRATE  Final   Special Requests NONE  Final   Gram Stain   Final    MODERATE WBC PRESENT, PREDOMINANTLY PMN NO  ORGANISMS SEEN Performed at [facility], [place] [address], [place], [place] [address]    Culture   Final    MODERATE CANDIDA ALBICANS FUNGUS (MOLD) ISOLATED, PROBABLE CONTAMINANT/COLONIZER (SAPROPHYTE). CONTACT MICROBIOLOGY IF FURTHER IDENTIFICATION REQUIRED [phone].    Report Status 08/26/2022 FINAL  Final  Culture, Respiratory w Gram Stain     Status: None   Collection Time: 08/24/22  2:55 PM   Specimen: Bronchoalveolar Lavage; Respiratory  Result Value Ref Range Status   Specimen Description BRONCHIAL ALVEOLAR LAVAGE  Final   Special Requests NONE  Final   Gram Stain   Final    ABUNDANT WBC PRESENT, PREDOMINANTLY PMN NO ORGANISMS SEEN Performed at [facility], [address], [place], [place] [address]    Culture   Final    RARE CANDIDA ALBICANS FUNGUS (MOLD) ISOLATED, PROBABLE CONTAMINANT/COLONIZER (SAPROPHYTE). CONTACT MICROBIOLOGY IF FURTHER IDENTIFICATION REQUIRED [phone].    Report Status 08/27/2022 FINAL  Final       Inpatient Medications:   Scheduled Meds:  bethanechol  10 mg Per Tube TID   buprenorphine  1 patch Transdermal Weekly   carvedilol  12.5 mg Per Tube BID WC   Chlorhexidine Gluconate Cloth  6 each Topical Daily   cyclobenzaprine  5 mg Per Tube TID   donepezil  10 mg Per Tube Daily   famotidine  20 mg Per Tube BID   fluvoxaMINE  100 mg Per Tube QHS   gabapentin  100 mg Per Tube Q8H   [name]'s butt cream   Topical Daily   heparin  5,000 Units Subcutaneous Q8H   insulin aspart  0-15 Units Subcutaneous Q4H   lamoTRIgine  200 mg Per Tube Daily   levothyroxine  88 mcg Per Tube QAC breakfast   multivitamin with minerals  1 tablet Per Tube Daily   mouth rinse  15 mL Mouth Rinse 4 times per day   sodium chloride flush  10-40 mL Intracatheter Q12H   sodium chloride flush  3 mL Intravenous Q12H   sodium chloride  1 g Per Tube BID WC   traZODone  100 mg Per Tube QHS   Continuous Infusions:  sodium chloride Stopped (08/29/22 2159)    ceFEPime (MAXIPIME) IV 2 g (09/01/22 0819)   feeding supplement (GLUCERNA 1.5 CAL) 50 mL/hr at 09/01/22 0600   metronidazole 500 mg (09/01/22 0603)   vancomycin 1,250 mg (09/01/22 1219)     Radiological Exams on Admission: MR BRAIN W WO CONTRAST  Result Date: 08/31/2022 CLINICAL DATA:  Meningitis/CNS infection suspected. Follow-up ventricular debris. EXAM: MRI HEAD WITHOUT AND WITH CONTRAST TECHNIQUE: Multiplanar, multiecho pulse sequences of the brain and surrounding structures were obtained without and with intravenous contrast. CONTRAST:  52m GADAVIST GADOBUTROL 1 MMOL/ML IV SOLN COMPARISON:  MRI brain 08/20/2022. FINDINGS: Brain: No acute infarct. Redemonstrated dependent diffusion signal abnormality in the occipital horn of the left lateral ventricle (image 21 series 2) with associated ependymal enhancement in both occipital horns (images 21-23 series 10). No ventricular dilatation. No other foci of abnormal diffusion signal or enhancement. Unchanged moderate chronic small-vessel disease. Vascular: Unremarkable flow voids within limitations of motion artifact. Skull and upper cervical spine: Normal marrow signal and enhancement. Sinuses/Orbits: Increased right mastoid effusion. No nasopharyngeal mass. Orbits are unremarkable. Other: None. IMPRESSION: Redemonstrated dependent diffusion signal abnormality in the occipital horn of the left lateral  ventricle with associated ependymal enhancement in both occipital horns, concerning for ventriculitis. No ventricular dilatation. Electronically Signed   By: Emmit Alexanders M.D.   On: 08/31/2022 14:44    Impression/Recommendations Principal Problem:   Wound dehiscence Active Problems:   Encephalopathy acute   Aspiration pneumonia of both lungs (HCC)   Acute respiratory failure with hypoxia (HCC)   Sepsis (HCC)   Seizure (HCC)   Malnutrition of moderate degree   Bacterial meningitis   Hardware complicating wound infection ([facility])   Infection of  deep incisional surgical site after procedure   1.Acute encephalopathy/delirium: Patient apparently has significantly improved since transfer to ICU in terms of mental status.  Primary MD still concerned about intermittent episodes of confusion but now also with agitation at times.  Patient does have mild cognitive impairment per record and is on Aricept at baseline.  She is currently awake alert oriented x 3 and son denies noticing any significant confusion while he has been with her.  Will follow delirium precautions and have Seroquel/melatonin available for as needed use.  Resume Aricept.  MRI of the brain which was done on 2/2 was concerning for ventriculitis and follow-up MRI on 2/13 had similar findings.  Will defer antibiotic management to ID and neurosurgery.  Avoid sedatives as much as possible-noted to be on trazodone 100 mg, Lamictal 200 mg (?  Seizures), Neurontin 100 mg every 8, oxycodone as needed, IV fentanyl every 15 minutes as needed, Flexeril 5 mg 3 times daily and Butrans patch.  Will reduce IV fentanyl frequency and taper other medications as tolerated and appropriate.  2.  Sepsis due to ventriculitis/meningitis/aspiration pneumonia lumbar spine osteomyelitis: Continue empiric IV antibiotic treatments/vancomycin, cefepime and Flagyl.  Defer further management to infectious diseases who are following along.  3. Hyponatremia/hypokalemia: Hyponatremia with hypochloremia likely secondary to SIADH.  Sodium levels significantly improved since last week from 122-->130 now.  Continue salt tablets.  Continue to monitor mental status.  4.  Acute and now chronic respiratory failure: Now s/p trach.  Continue routine trach care and PMV.  RT/ST following along.  5.  DM type II: Monitor blood glucose levels as patient beginning to eat and tapering tube feeds (Glucerna).  On sliding scale insulin.  6.  Diarrhea: Initially treated with antidiarrheals and ruled out C. difficile.  Now resolving.  Hold  off on stool softeners.  DC rectal tube if no further concerns for diarrhea and if not needed for wound care/soiling.  7.  Hypothyroidism: On Synthroid  8.  Depression/mild cognitive impairment: Resumed on home meds including Aricept.  Avoid polypharmacy as discussed in problem #1.  9.  Spinal surgery, wound complications: Defer management to primary service and wound care.  On several analgesics and muscle relaxants as discussed in problem #1 which might be contributing to AMS.  Taper slowly as tolerated and needed.  On Foley/rectal tube to  ? avoid wound soiling  10.  Anemia of chronic disease: Continue monitoring and transfuse for hemoglobin less than 7.  Currently hemoglobin stable and no concern for acute bleeding.  11. FEN: Dietitian to follow along and taper off tube feeds as tolerated.  Can transition to nighttime only feeds as of now.   Patient has begin to take p.o. and tolerating well so far.    Thank you for this consultation.  I have discussed above impression and treatment plan with patient and with son in detail at bedside.  Son is appreciative of care provided.  Our [facility] hospitalist team will  follow the patient with you.   Time Spent: 67 minutes  Guilford Shi M.D. [facility] 09/01/2022, 1:40 PM

## 2022-09-02 DIAGNOSIS — T8130XA Disruption of wound, unspecified, initial encounter: Secondary | ICD-10-CM | POA: Diagnosis not present

## 2022-09-02 LAB — COMPREHENSIVE METABOLIC PANEL
ALT: 12 U/L (ref 0–44)
AST: 22 U/L (ref 15–41)
Albumin: 2.1 g/dL — ABNORMAL LOW (ref 3.5–5.0)
Alkaline Phosphatase: 61 U/L (ref 38–126)
Anion gap: 9 (ref 5–15)
BUN: 7 mg/dL — ABNORMAL LOW (ref 8–23)
CO2: 28 mmol/L (ref 22–32)
Calcium: 8.3 mg/dL — ABNORMAL LOW (ref 8.9–10.3)
Chloride: 93 mmol/L — ABNORMAL LOW (ref 98–111)
Creatinine, Ser: 0.34 mg/dL — ABNORMAL LOW (ref 0.44–1.00)
GFR, Estimated: >60 mL/min (ref >60)
Glucose, Bld: 109 mg/dL — ABNORMAL HIGH (ref 70–99)
Potassium: 3.4 mmol/L — ABNORMAL LOW (ref 3.5–5.1)
Sodium: 130 mmol/L — ABNORMAL LOW (ref 135–145)
Total Bilirubin: 0.5 mg/dL (ref 0.3–1.2)
Total Protein: 5.2 g/dL — ABNORMAL LOW (ref 6.5–8.1)

## 2022-09-02 LAB — MAGNESIUM: Magnesium: 1.7 mg/dL (ref 1.7–2.4)

## 2022-09-02 LAB — CBC
HCT: 28.1 % — ABNORMAL LOW (ref 36.0–46.0)
Hemoglobin: 9.3 g/dL — ABNORMAL LOW (ref 12.0–15.0)
MCH: 33.5 pg (ref 26.0–34.0)
MCHC: 33.1 g/dL (ref 30.0–36.0)
MCV: 101.1 fL — ABNORMAL HIGH (ref 80.0–100.0)
Platelets: 441 10*3/uL — ABNORMAL HIGH (ref 150–400)
RBC: 2.78 MIL/uL — ABNORMAL LOW (ref 3.87–5.11)
RDW: 20.1 % — ABNORMAL HIGH (ref 11.5–15.5)
WBC: 8.9 10*3/uL (ref 4.0–10.5)
nRBC: 0 % (ref 0.0–0.2)

## 2022-09-02 LAB — GLUCOSE, CAPILLARY
Glucose-Capillary: 112 mg/dL — ABNORMAL HIGH (ref 70–99)
Glucose-Capillary: 113 mg/dL — ABNORMAL HIGH (ref 70–99)
Glucose-Capillary: 131 mg/dL — ABNORMAL HIGH (ref 70–99)

## 2022-09-02 MED ORDER — GLUCERNA 1.5 CAL PO LIQD
1000.0000 mL | ORAL | Status: DC
  Administered 2022-09-02: 1000 mL
  Filled 2022-09-02 (×2): qty 1000

## 2022-09-02 MED ORDER — ENSURE ENLIVE PO LIQD
237.0000 mL | Freq: Three times a day (TID) | ORAL | Status: DC
  Administered 2022-09-02 – 2022-09-03 (×4): 237 mL via ORAL

## 2022-09-02 MED ORDER — POTASSIUM CHLORIDE CRYS ER 20 MEQ PO TBCR
40.0000 meq | EXTENDED_RELEASE_TABLET | Freq: Once | ORAL | Status: AC
  Administered 2022-09-02: 40 meq via ORAL
  Filled 2022-09-02: qty 2

## 2022-09-02 MED ORDER — ALTEPLASE 2 MG IJ SOLR
2.0000 mg | Freq: Once | INTRAMUSCULAR | Status: AC
  Administered 2022-09-02: 2 mg
  Filled 2022-09-02: qty 2

## 2022-09-02 MED ORDER — ALTEPLASE 2 MG IJ SOLR
2.0000 mg | Freq: Once | INTRAMUSCULAR | Status: DC
  Filled 2022-09-02: qty 2

## 2022-09-02 NOTE — Evaluation (Signed)
Speech Language Pathology Evaluation Patient Details Name: [name] MRN: [id number] DOB: 10-11-1955 Today's Date: 09/02/2022 Time: [id number] SLP Time Calculation (min) (ACUTE ONLY): 28 min  Problem List:  Patient Active Problem List   Diagnosis Date Noted   Malnutrition of moderate degree 08/20/2022   Bacterial meningitis [id number]   Hardware complicating wound infection ([place]) 08/20/2022   Infection of deep incisional surgical site after procedure 08/20/2022   Encephalopathy acute 08/19/2022   Aspiration pneumonia of both lungs ([place]) 08/19/2022   Acute respiratory failure with hypoxia ([place]) 08/19/2022   Sepsis ([place]) 08/19/2022   Seizure ([place]) 08/19/2022   Wound dehiscence 08/09/2022   Delayed surgical wound healing 07/08/2022   Fracture of lumbar spine without cord injury ([place]) 06/26/2022   Lumbar vertebral fracture ([place]) 06/25/2022   Mood disorder ([place]) 09/29/2021   Mild cognitive impairment 09/06/2018   Hyponatremia 02/28/2018   S/P shoulder replacement, left 08/19/2016   Anxiety 10/17/2014   Acid reflux 10/17/2014   BP (high blood pressure) 10/17/2014   Arthritis, degenerative 10/17/2014   Adult hypothyroidism 10/17/2014   UTI (urinary tract infection) 06/03/2014   Fracture of bone adjacent to prosthesis 06/03/2014   Diabetes ([place]) 06/02/2014   Peri-prosthetic fracture of femur following total hip arthroplasty 06/02/2014   Bladder retention 05/13/2014   Chronic pain 05/10/2014   History of hip surgery 05/10/2014   UNSPECIFIED HEART FAILURE 06/24/2010   UNSPECIFIED CONGENITAL DEFECT OF SEPTAL CLOSURE 06/24/2010   TOBACCO ABUSE 03/18/2010   Secondary cardiomyopathy ([place]) 03/18/2010   DM 06/13/2009   HYPERTENSION, UNSPECIFIED 06/13/2009   VENTRICULAR TACHYCARDIA 06/13/2009   VENTRICULAR SEPTAL DEFECT, CONGENITAL 06/13/2009   Diabetes mellitus, type 2 ([place]) 06/13/2009   Essential (primary) hypertension 06/13/2009   Past Medical History:  Past Medical  History:  Diagnosis Date   Anemia    Anxiety    Arthritis    Depression    GERD (gastroesophageal reflux disease)    Heart murmur    "related to VSD"   High cholesterol    History of blood transfusion    "related to OR" (08/19/2016)   History of hiatal hernia    Hypertension    Hyperthyroidism    Mild cognitive impairment 09/06/2018   Paroxysmal ventricular tachycardia (H[facility])    Type II diabetes mellitus ([place])    UTI (urinary tract infection)    being treated with Keflex   Ventricular septal defect    Past Surgical History:  Past Surgical History:  Procedure Laterality Date   ABDOMINAL HYSTERECTOMY     BACK SURGERY     CARDIAC CATHETERIZATION N/A 06/23/2015   Procedure: Left Heart Cath and Coronary Angiography;  Surgeon: Larey Dresser, MD;  Location: [facility];  Service: Cardiovascular;  Laterality: N/A;   CARDIAC CATHETERIZATION  1960   "VSD was so small; didn't need repaired"   EXAM UNDER ANESTHESIA WITH MANIPULATION OF HIP Right 06/02/2014   dr Rhona Raider   FRACTURE SURGERY     HERNIA REPAIR     HIP CLOSED REDUCTION Right 06/02/2014   Procedure: CLOSED MANIPULATION HIP;  Surgeon: Hessie Dibble, MD;  Location: [facility];  Service: Orthopedics;  Laterality: Right;   JOINT REPLACEMENT     JOINT REPLACEMENT     POSTERIOR LUMBAR FUSION 4 LEVEL N/A 06/26/2022   Procedure: Lumbar One To Lumbar Five Posterior Instrumented Fusion;  Surgeon: Judith Part, MD;  Location: [facility];  Service: Neurosurgery;  Laterality: N/A;   REFRACTIVE SURGERY Bilateral    SHOULDER ARTHROSCOPY Right  SHOULDER OPEN ROTATOR CUFF REPAIR Right    SPINAL FUSION  1996   "t10 down to my coccyx   SPINE HARDWARE REMOVAL     TOTAL ABDOMINAL HYSTERECTOMY     TOTAL HIP ARTHROPLASTY Right 05/10/2014   [place]      by dr Harrell Gave [place]   TOTAL KNEE ARTHROPLASTY Left    TOTAL SHOULDER ARTHROPLASTY Left 08/19/2016   Procedure: TOTAL SHOULDER ARTHROPLASTY;  Surgeon: Tania Ade, MD;   Location: [facility];  Service: Orthopedics;  Laterality: Left;  Left total shoulder replacement   HPI:  Patient is a 67 y/o female who presents on 1/22 for revision of complex lumbar wound, removal of left L1-L5 lumbar screws and rod, removal of right L1 screw and revision of L1-L5 posterior instrumented fusion. Current admission has been complicated by encephalopathy which initially seemed 2/2 polypharmacy, then thought possible UTI, and Cefadroxil was added 1/26.  CNS depressing medications were also de-escalated.  Despite these measures, Pt become increasingly encephalopathic. 2/1: Admitted to ICU and intubated. 2/2: MRI brain concerning for meningitis. 2/6: Pt was extubated although re-intubated due to fatigue and airway protection; trached. PMH includes GERD, HH, DM, HTN, anxiety, left TSA, arthritis. Esophagram in 2021 revealed previous Nissen fundoplication as well as moderate to severe esophageal dysmotility.   Assessment / Plan / Recommendation Clinical Impression  Pt scored a 17/30 on the SLUMS characterized primarily by difficulties with attention and memory. Pt had overall poor attention to sections of the test, but seemed to exhibit awareness as she asked for repetitions and asked family members to be quiet while testing. During sections of the test, she paused to ask for clarification and for the SLP to repeat the instructions multiple times. She did not correctly recall any of the five objects listed in the test, but accurately named them when given cues. Pt exhibited good planning and problem-solving as evidenced by the clock task. She demonstrates great effort and states that she would like to start playing "brain games" again. Her response to cueing during the test indicates that she will likely be successful with continued therapy and education. Recommend f/u to address attention and memory through functional tasks.    SLP Assessment  SLP Recommendation/Assessment: Patient needs continued  Speech [place] [facility] SLP Visit Diagnosis: Cognitive communication deficit (R41.841)    Recommendations for follow up therapy are one component of a multi-disciplinary discharge planning process, led by the attending physician.  Recommendations may be updated based on patient status, additional functional criteria and insurance authorization.    Follow Up Recommendations  Acute inpatient rehab (3hours/day)    Assistance Recommended at Discharge  Frequent or constant Supervision/Assistance  Functional Status Assessment Patient has had a recent decline in their functional status and demonstrates the ability to make significant improvements in function in a reasonable and predictable amount of time.  Frequency and Duration min 2x/week  2 weeks      SLP Evaluation Cognition  Overall Cognitive Status: Impaired/Different from baseline Arousal/Alertness: Awake/alert Orientation Level: Oriented X4 Attention: Sustained;Selective Sustained Attention: Impaired Sustained Attention Impairment: Functional complex;Verbal basic Selective Attention: Impaired Selective Attention Impairment: Verbal complex;Functional complex Memory: Impaired Memory Impairment: Retrieval deficit Awareness: Appears intact Problem Solving: Appears intact Safety/Judgment: Appears intact       Comprehension  Auditory Comprehension Overall Auditory Comprehension: Appears within functional limits for tasks assessed Visual Recognition/Discrimination Discrimination: Within Function Limits Reading Comprehension Reading Status: Not tested    Expression Expression Primary Mode of Expression: Verbal Verbal Expression Overall Verbal  Expression: Appears within functional limits for tasks assessed Written Expression Dominant Hand: Right   Oral / Motor  Motor Speech Overall Motor Speech: Appears within functional limits for tasks assessed            Fabio Asa., Student SLP  09/02/2022, 12:08 PM

## 2022-09-02 NOTE — Progress Notes (Signed)
Physical Therapy Treatment Patient Details Name: [name] MRN: [id number] DOB: Mar 26, 1956 Today's Date: 09/02/2022   History of Present Illness Patient is a 67 y/o female who presents on 1/22 for revision of complex lumbar wound, removal of left L1-L5 lumbar screws and rod, removal of right L1 screw and revision of L1-L5 posterior instrumented fusion. 1/30 pt febrile and more lethargic in AM, MD ordered CXR. Pt s/p trach on 2/6. PMH includes DM, HTN, anxiety, left TSA, arthritis. Current admission course has been complicated by encephalopathywhich initially seemed 2/2 polypharmacy, then thought possible UTI, and Cefadroxil was added 1/26.  CNS depressing medications were also de-escalated.  Despite these measures, Pt become increasingly encephalopathic.2/1: Admitted to ICU and intubated. 2/2: MR brain concerning for meningitis. 2/6: Pt was extubated although re-intubated due to fatigue and airway protection.    PT Comments    Patient progressing some this session with participation and mobility.  She did in bed exercises without complaining and was only frustrated PT was interrupting her visiting with her sister from [place].  She dd better with squat pivot transfer than with [name] transfer and seemed to sit EOB unsupported a little longer.  She will continue to benefit from skilled PT in the acute setting.  Feel she will achieve a level of assist to allow home following AIR level rehab stay.    Recommendations for follow up therapy are one component of a multi-disciplinary discharge planning process, led by the attending physician.  Recommendations may be updated based on patient status, additional functional criteria and insurance authorization.  Follow Up Recommendations  Acute inpatient rehab (3hours/day) Can patient physically be transported by private vehicle: No   Assistance Recommended at Discharge Frequent or constant Supervision/Assistance  Patient can return home with the  following Two people to help with walking and/or transfers;A lot of help with bathing/dressing/bathroom;Assist for transportation;Help with stairs or ramp for entrance;Direct supervision/assist for medications management;Assistance with cooking/housework   Equipment Recommendations  None recommended by PT    Recommendations for Other Services       Precautions / Restrictions Precautions Precautions: Fall;Back Precaution Comments: trach, cortrack, flexiseal, wound vac on back, using PMSV     Mobility  Bed Mobility Overal bed mobility: Needs Assistance Bed Mobility: Rolling, Sidelying to Sit Rolling: Mod assist, +2 for safety/equipment Sidelying to sit: +2 for physical assistance, Max assist       General bed mobility comments: using rail and bit to help assist for legs off bed and to lift trunk    Transfers Overall transfer level: Needs assistance   Transfers: Bed to chair/wheelchair/BSC       Squat pivot transfers: +2 physical assistance, Max assist     General transfer comment: up on L leg mainly for pivot to recliner with armrest lowered and using bed pad to pivot due to skin issued on her back and gait belt not used    Ambulation/Gait                   Stairs             Wheelchair Mobility    Modified Rankin (Stroke Patients Only)       Balance Overall balance assessment: Needs assistance Sitting-balance support: Feet supported Sitting balance-Leahy Scale: Poor Sitting balance - Comments: sitting with S about 20 seconds then assisted to prevent L lateral LOB Postural control: Left lateral lean     Standing balance comment: NT  Cognition Arousal/Alertness: Awake/alert Behavior During Therapy: Anxious Overall Cognitive Status: Impaired/Different from baseline Area of Impairment: Attention, Memory, Following commands, Safety/judgement, Problem solving, Awareness                   Current  Attention Level: Sustained Memory: Decreased recall of precautions, Decreased short-term memory Following Commands: Follows one step commands with increased time (and multimodal cues) Safety/Judgement: Decreased awareness of safety, Decreased awareness of deficits   Problem Solving: Slow processing, Decreased initiation, Difficulty sequencing, Requires verbal cues, Requires tactile cues General Comments: frustrated and perseverating on the fact that she was trying to visit with her sister when PT arrived.        Exercises Other Exercises Other Exercises: Supine scap squeezes x 5 w/ 5 sec hold Other Exercises: supine TA activation with knees flexed (assisted for feet on bed) x 5 w/ 5 sec hold Other Exercises: hooklying marching assisted x 5 Other Exercises: hooklying lateral trunk rotation x 5 assisted    General Comments General comments (skin integrity, edema, etc.): family in the room initially during HEP instruction; left during OOB transfer; on 28% trach collar wearing PMSV throughout and well tolerated with VSS      Pertinent Vitals/Pain Pain Assessment Pain Assessment: Faces Faces Pain Scale: Hurts even more Pain Location: back with mobility Pain Descriptors / Indicators: Grimacing, Guarding, Discomfort Pain Intervention(s): Monitored during session, Patient requesting pain meds-RN notified, RN gave pain meds during session    Home Living                          Prior Function            PT Goals (current goals can now be found in the care plan section) Progress towards PT goals: Progressing toward goals    Frequency    Min 4X/week      PT Plan Current plan remains appropriate    Co-evaluation              AM-PAC PT "6 Clicks" Mobility   Outcome Measure  Help needed turning from your back to your side while in a flat bed without using bedrails?: Total Help needed moving from lying on your back to sitting on the side of a flat bed without  using bedrails?: Total Help needed moving to and from a bed to a chair (including a wheelchair)?: Total Help needed standing up from a chair using your arms (e.g., wheelchair or bedside chair)?: Total Help needed to walk in hospital room?: Total   6 Click Score: 5    End of Session Equipment Utilized During Treatment: Oxygen Activity Tolerance: Patient tolerated treatment well Patient left: in chair;with call bell/phone within reach;with family/visitor present   PT Visit Diagnosis: Pain;Muscle weakness (generalized) (M62.81);Difficulty in walking, not elsewhere classified (R26.2) Pain - part of body:  (back)     Time: [id number] PT Time Calculation (min) (ACUTE ONLY): 40 min  Charges:  $Therapeutic Exercise: 8-22 mins $Therapeutic Activity: 23-37 mins                     Magda Kiel, PT Acute Rehabilitation Services Office:[phone] 09/02/2022    Reginia Naas 09/02/2022, 2:09 PM

## 2022-09-02 NOTE — Plan of Care (Signed)
Problem: Education: Goal: Ability to verbalize activity precautions or restrictions will improve Outcome: Progressing Goal: Knowledge of the prescribed therapeutic regimen will improve Outcome: Progressing Goal: Understanding of discharge needs will improve Outcome: Progressing   Problem: Activity: Goal: Ability to avoid complications of mobility impairment will improve Outcome: Progressing Goal: Ability to tolerate increased activity will improve Outcome: Progressing Goal: Will remain free from falls Outcome: Progressing   Problem: Bowel/Gastric: Goal: Gastrointestinal status for postoperative course will improve Outcome: Progressing   Problem: Clinical Measurements: Goal: Ability to maintain clinical measurements within normal limits will improve Outcome: Progressing Goal: Postoperative complications will be avoided or minimized Outcome: Progressing Goal: Diagnostic test results will improve Outcome: Progressing   Problem: Pain Management: Goal: Pain level will decrease Outcome: Progressing   Problem: Skin Integrity: Goal: Will show signs of wound healing Outcome: Progressing   Problem: Health Behavior/Discharge Planning: Goal: Identification of resources available to assist in meeting health care needs will improve Outcome: Progressing   Problem: Bladder/Genitourinary: Goal: Urinary functional status for postoperative course will improve Outcome: Progressing   Problem: Education: Goal: Knowledge of General Education information will improve Description: Including pain rating scale, medication(s)/side effects and non-pharmacologic comfort measures Outcome: Progressing   Problem: Health Behavior/Discharge Planning: Goal: Ability to manage health-related needs will improve Outcome: Progressing   Problem: Clinical Measurements: Goal: Ability to maintain clinical measurements within normal limits will improve Outcome: Progressing Goal: Will remain free from  infection Outcome: Progressing Goal: Diagnostic test results will improve Outcome: Progressing Goal: Respiratory complications will improve Outcome: Progressing Goal: Cardiovascular complication will be avoided Outcome: Progressing   Problem: Activity: Goal: Risk for activity intolerance will decrease Outcome: Progressing   Problem: Nutrition: Goal: Adequate nutrition will be maintained Outcome: Progressing   Problem: Coping: Goal: Level of anxiety will decrease Outcome: Progressing   Problem: Elimination: Goal: Will not experience complications related to bowel motility Outcome: Progressing Goal: Will not experience complications related to urinary retention Outcome: Progressing   Problem: Pain Managment: Goal: General experience of comfort will improve Outcome: Progressing   Problem: Safety: Goal: Ability to remain free from injury will improve Outcome: Progressing   Problem: Skin Integrity: Goal: Risk for impaired skin integrity will decrease Outcome: Progressing   Problem: Activity: Goal: Ability to tolerate increased activity will improve Outcome: Progressing   Problem: Respiratory: Goal: Ability to maintain a clear airway and adequate ventilation will improve Outcome: Progressing   Problem: Role Relationship: Goal: Method of communication will improve Outcome: Progressing   Problem: Education: Goal: Ability to describe self-care measures that may prevent or decrease complications (Diabetes Survival Skills Education) will improve Outcome: Progressing Goal: Individualized Educational Video(s) Outcome: Progressing   Problem: Coping: Goal: Ability to adjust to condition or change in health will improve Outcome: Progressing   Problem: Fluid Volume: Goal: Ability to maintain a balanced intake and output will improve Outcome: Progressing   Problem: Health Behavior/Discharge Planning: Goal: Ability to identify and utilize available resources and  services will improve Outcome: Progressing Goal: Ability to manage health-related needs will improve Outcome: Progressing   Problem: Metabolic: Goal: Ability to maintain appropriate glucose levels will improve Outcome: Progressing   Problem: Nutritional: Goal: Maintenance of adequate nutrition will improve Outcome: Progressing Goal: Progress toward achieving an optimal weight will improve Outcome: Progressing   Problem: Skin Integrity: Goal: Risk for impaired skin integrity will decrease Outcome: Progressing   Problem: Tissue Perfusion: Goal: Adequacy of tissue perfusion will improve Outcome: Progressing   Problem: Safety: Goal: Non-violent Restraint(s) Outcome: Progressing  Problem: Education: Goal: Knowledge about tracheostomy care/management will improve Outcome: Progressing

## 2022-09-02 NOTE — Progress Notes (Signed)
Nutrition Follow-up  DOCUMENTATION CODES:   Non-severe (moderate) malnutrition in context of acute illness/injury  INTERVENTION:   - Please obtain updated weight  Transition to nocturnal tube feeds via Cortrak: - Glucerna 1.5 @ 75 ml/hr x 12 hours from 1800 to 0600 (total of 900 ml)  Nocturnal tube feeding regimen provides 1350 kcal, 74 grams of protein, and 683 ml of H2O (meets 75% of minimum kcal needs and 82% of minimum protein needs).  - Ensure Enlive po TID, each supplement provides 350 kcal and 20 grams of protein  - Continue MVI with minerals daily  - Recommend initiating 48-hour calorie count over the weekend  NUTRITION DIAGNOSIS:   Moderate Malnutrition related to acute illness as evidenced by energy intake < 75% for > 7 days, mild fat depletion, mild muscle depletion.  Ongoing, being addressed via TF, oral nutrition supplements, and diet advancement  GOAL:   Patient will meet greater than or equal to 90% of their needs  Progressing  MONITOR:   PO intake, Supplement acceptance, Labs, Weight trends, TF tolerance  REASON FOR ASSESSMENT:   Consult Enteral/tube feeding initiation and management  ASSESSMENT:   Pt with hx of GERD, HLD, HTN, and DM type 2 presented to ED for planned revision of lumbar surgery after previous was not healing and had exposed hardware.  02/01 - tx ICU, intubated 02/02 - Cortrak placed (tip in mid distal stomach) 02/05 - extubated, later reintubated  02/06 - trach placed  02/12 - MBS, diet advanced to regular, thin liquids  RD consulted to transition pt to nocturnal tube feeds via Cortrak. Discussed plan with RN.  Spoke with pt's family at bedside. They report pt with poor PO intake this morning (a few bites of pancake, a few bites of potato, 50% of an orange juice, 50% of hot tea with honey).  Discussed plan to transition to nocturnal tube feeds. Family reports that pt loves chocolate Ensure. RD to order these TID between meals.  Family can bring in outside food per pt's preferences.  Family reports pt is "wasting away." They are concerned about her nutrition. The only weight available this admission is from 08/09/22 and appears to be stated rater than measured (67.6 kg, 149 lbs).  Medications reviewed and include: pepcid, SSI q 4 hours, lamictal, MVI with minerals, sodium chloride 1 gram BID, IV abx  Labs reviewed: sodium 130, potassium 3.4, hemoglobin 9.3 CBG's: 131-135 x 24 hours  UOP: 1850 ml x 24 hours I/O's: -13.0 L since admit  Diet Order:   Diet Order             Diet regular Fluid consistency: Thin  Diet effective now                   EDUCATION NEEDS:   Education needs have been addressed  Skin:  Skin Assessment: Reviewed RN Assessment (Stage 2 to the sacrum)  Last BM:  09/02/22 small type 6  Height:   Ht Readings from Last 1 Encounters:  08/09/22 5' (1.524 m)    Weight:   Wt Readings from Last 1 Encounters:  06/26/22 72.6 kg    Ideal Body Weight:  45.5 kg  BMI:  Body mass index is 29.1 kg/m.  Estimated Nutritional Needs:   Kcal:  1800-2000  Protein:  90-110 grams  Fluid:  1.8L/d    Gustavus Bryant, MS, RD, LDN Inpatient Clinical Dietitian Please see [facility] for contact information.

## 2022-09-02 NOTE — Progress Notes (Signed)
Neurosurgery Service Progress Note  Subjective: NAE ON, no respiratory issues, still a bit activated   Objective: Vitals:   09/02/22 0300 09/02/22 0747 09/02/22 0834 09/02/22 1104  BP: 132/76 135/85  (!) 149/87  Pulse: 81 94 94 77  Resp: 12 16 18 13  $ Temp: 97.9 F (36.6 C) 98.4 F (36.9 C)  97.9 F (36.6 C)  TempSrc: Oral Oral  Oral  SpO2: 99% 100% 100% 99%  Weight:      Height:        Physical Exam: TC with speaking valve, interactive, +poor short term memory, PERRL, gaze conjugate, Fcx4 with deconditioning, wound vac in place with minimal output  Assessment & Plan: 67 y.o. female s/p revision of complex lumbar wound, removal of left L1-L5 lumbar screws and rod, removal of right L1 screw, revision of L1-L5 posterior instrumented fusion. Complicated post-operative course   -doing well on step down -medicine recs -CIR pending   Judith Part, MD

## 2022-09-02 NOTE — PMR Pre-admission (Signed)
MR Admission Coordinator Pre-Admission Assessment  Patient: [name] is an 67 y.o., female MRN: [id number] DOB: 05/16/1956 Height: 5' (152.4 cm) Weight:  (Bedscale not working)              Forensic scientist HMO:     PPO:      PCP:      IPA:      80/20:      OTHER:  PRIMARY: [name]      Policy#: [id number]      Subscriber: pt CM Name: [name]      Phone#: [phone]     Fax#: [phone] Pre-Cert#: [id number]   approved for 7 days   Employer:  Benefits:  Phone #: [phone]     Name: 2/15 Eff. Date: 07/19/22     Deduct: none      Out of Pocket Max: $3150      Life Max: none  CIR: $335 co pay per day days 1 until 5      SNF: no co pay per day days 1 until 20; $203 co pay per day days 21 until 60; no co pay per day days 61 until 100 Outpatient: $10 per visit     Co-Pay: visits per medical necessity Home Health: 100%      Co-Pay: none DME: 80%     Co-Pay: 20% Providers: in network  SECONDARY: none  Financial Counselor:       Phone#:   [employer] Engineer, [employer]" for patients in Inpatient Rehabilitation Facilities with attached "Privacy Act [place] Records" was provided and verbally reviewed with: Patient and Family  Emergency Contact Information Contact Information     Name Relation Home Work Mobile   [name] Spouse [phone] [place] Daughter   [phone]   [name] Daughter   [phone]      Current Medical History  Patient Admitting Diagnosis: Revision Lumbar spine wound  History of Present Illness:  67 y.o. female with a history of L1 L5 PLIF on 06/26/22 for unstable fracture through prior lumbar fusion who was readmitted on 07/08/22 for wound dehiscence. She was discharged to SNF after both admissions. [name]  was admitted again on August 09, 2018 for with wound dehiscence and exposed hardware with unstable lumbar spine fracture at the surgical site.   The patient  underwent revision of a complex lumbar wound with removal of left L1, L2, L3, L4, and L5 lumbar screws and rod, removal of right L1 screw, revision of L1-L5 posterior instrumented fusion by Dr. Marcello Moores O's regard.  Patient was noted to be lethargic postoperatively and was diagnosed with a UTI treated with cefadrox.  Pain medication was decreased also.  Patient was also hyponatremic.  Patient was seen by [name] nurse for placement of VAC on 08/18/2022.  The same day patient was found to be tachypneic and there was concern for early sepsis and pulmonary edema.  Hospitalist were consulted to follow patient.  Abx were started empirically for sepsis.  No obvious source of infection was identified and infectious disease was consulted.  Critical care was also consulted for increased confusion and worsening shortness of breath.  CTA of the chest demonstrated multifocal infiltrates concerning for pneumonia due to aspiration.  Patient was ultimately intubated.  CT of the lumbar spine was unremarkable for acute changes.  MRI of the brain showed a punctate focus of ischemia in the right frontal lobe as well as some changes superior horn of the left lateral ventricle concerning for  proteinaceous, purulent debris.  Flagyl was added for aspiration pneumonia as well as CNS coverage.  Blood cultures were negative.  Extubation was attempted on 08/23/2022 but patient failed and had to be reintubated and tracheostomy was placed the same day.  Patient is currently on vancomycin, cefepime, and Flagyl with the plan of 6 weeks of total antibiotic coverage.     Patient's medical record from [name] has been reviewed by the rehabilitation admission coordinator and physician.  Past Medical History  Past Medical History:  Diagnosis Date   Anemia    Anxiety    Arthritis    Depression    GERD (gastroesophageal reflux disease)    Heart murmur    "related to VSD"   High cholesterol    History of blood transfusion    "related  to OR" (08/19/2016)   History of hiatal hernia    Hypertension    Hyperthyroidism    Mild cognitive impairment 09/06/2018   Paroxysmal ventricular tachycardia (HCC)    Type II diabetes mellitus (HCC)    UTI (urinary tract infection)    being treated with Keflex   Ventricular septal defect    Has the patient had major surgery during 100 days prior to admission? Yes  Family History  family history includes Chorea in her maternal grandfather; Dementia in her father, maternal aunt, and maternal aunt; Heart attack in an other family member; Other in her mother; Stroke (age of onset: 66) in her father.  Current Medications   Current Facility-Administered Medications:    0.9 %  sodium chloride infusion, , Intravenous, PRN, Chesley Mires, MD, Stopped at 08/29/22 2159   acetaminophen (TYLENOL) tablet 650 mg, 650 mg, Per Tube, Q6H PRN, 650 mg at 09/02/22 0551 **OR** acetaminophen (TYLENOL) suppository 650 mg, 650 mg, Rectal, Q6H PRN, Judith Part, MD   bethanechol (URECHOLINE) tablet 10 mg, 10 mg, Per Tube, TID, Jacky Kindle, MD, 10 mg at 09/03/22 0840   buprenorphine (BUTRANS) 5 MCG/HR 1 patch, 1 patch, Transdermal, Weekly, Ostergard, Joyice Faster, MD, 1 patch at 08/31/22 2104   carvedilol (COREG) tablet 12.5 mg, 12.5 mg, Per Tube, BID WC, Chand, Sudham, MD, 12.5 mg at 09/03/22 0631   ceFEPIme (MAXIPIME) 2 g in sodium chloride 0.9 % 100 mL IVPB, 2 g, Intravenous, Q8H, Paytes, Austin A, RPH, Stopped at 09/03/22 0909   Chlorhexidine Gluconate Cloth 2 % PADS 6 each, 6 each, Topical, Daily, Darrick Meigs, Marge Duncans, MD, 6 each at 09/03/22 0841   cyclobenzaprine (FLEXERIL) tablet 5 mg, 5 mg, Per Tube, TID, Jacky Kindle, MD, 5 mg at 09/03/22 0840   diphenoxylate-atropine (LOMOTIL) 2.5-0.025 MG per tablet 1-2 tablet, 1-2 tablet, Oral, QID PRN, Oswald Hillock, MD, 2 tablet at 08/15/22 1756   donepezil (ARICEPT) tablet 10 mg, 10 mg, Per Tube, Daily, Ventura Sellers, RPH, 10 mg at 09/03/22 0840   famotidine  (PEPCID) tablet 20 mg, 20 mg, Per Tube, BID, Bowser, Grace E, NP, 20 mg at 09/03/22 0840   feeding supplement (ENSURE ENLIVE / ENSURE PLUS) liquid 237 mL, 237 mL, Oral, TID BM, Ostergard, Thomas A, MD, 237 mL at 09/03/22 0842   feeding supplement (GLUCERNA 1.5 CAL) liquid 1,000 mL, 1,000 mL, Per Tube, Q24H, Caren Griffins, MD, Infusion Verify at 09/03/22 1054   fentaNYL (SUBLIMAZE) injection 25-50 mcg, 25-50 mcg, Intravenous, Q6H PRN, Judith Part, MD, 50 mcg at 09/03/22 0810   fluvoxaMINE (LUVOX) tablet 100 mg, 100 mg, Per Tube, QHS, Jacky Kindle, MD, 100 mg  at 09/02/22 2150   gabapentin (NEURONTIN) 250 MG/5ML solution 100 mg, 100 mg, Per Tube, Q8H, Jacky Kindle, MD, 100 mg at 09/03/22 0631   [name]'s butt cream, , Topical, Daily, Jacky Kindle, MD, Given at 09/03/22 0900   heparin injection 5,000 Units, 5,000 Units, Subcutaneous, Q8H, Jacky Kindle, MD, 5,000 Units at 09/03/22 0631   insulin aspart (novoLOG) injection 0-15 Units, 0-15 Units, Subcutaneous, Q4H, Jacky Kindle, MD, 2 Units at 09/03/22 0842   ipratropium-albuterol (DUONEB) 0.5-2.5 (3) MG/3ML nebulizer solution 3 mL, 3 mL, Nebulization, Q6H PRN, Judith Part, MD   labetalol (NORMODYNE) injection 10 mg, 10 mg, Intravenous, Q2H PRN, Jacky Kindle, MD, 10 mg at 08/30/22 1804   lamoTRIgine (LAMICTAL) tablet 200 mg, 200 mg, Per Tube, Daily, Ventura Sellers, RPH, 200 mg at 09/03/22 0840   levothyroxine (SYNTHROID) tablet 88 mcg, 88 mcg, Per Tube, QAC breakfast, Ventura Sellers, RPH, 88 mcg at 09/03/22 0631   melatonin tablet 5 mg, 5 mg, Per Tube, QHS PRN, Kamineni, Neelima, MD   menthol-cetylpyridinium (CEPACOL) lozenge 3 mg, 1 lozenge, Oral, PRN **OR** [DISCONTINUED] phenol (CHLORASEPTIC) mouth spray 1 spray, 1 spray, Mouth/Throat, PRN, Darrick Meigs, Marge Duncans, MD   metroNIDAZOLE (FLAGYL) IVPB 500 mg, 500 mg, Intravenous, Q8H, Ostergard, Joyice Faster, MD, Last Rate: 100 mL/hr at 09/03/22 1054, Infusion Verify at 09/03/22 1054    multivitamin with minerals tablet 1 tablet, 1 tablet, Per Tube, Daily, Ventura Sellers, RPH, 1 tablet at 09/03/22 0840   ondansetron (ZOFRAN) tablet 4 mg, 4 mg, Oral, Q6H PRN **OR** ondansetron (ZOFRAN) injection 4 mg, 4 mg, Intravenous, Q6H PRN, Darrick Meigs, Marge Duncans, MD, 4 mg at 09/03/22 0911   Oral care mouth rinse, 15 mL, Mouth Rinse, PRN, Bowser, Laurel Dimmer, NP   Oral care mouth rinse, 15 mL, Mouth Rinse, 4 times per day, Collene Schlichter, PA-C, 15 mL at 09/03/22 1145   Oral care mouth rinse, 15 mL, Mouth Rinse, PRN, Cosentino, Allison R, PA-C   oxyCODONE (Oxy IR/ROXICODONE) immediate release tablet 7.5 mg, 7.5 mg, Per Tube, Q4H PRN, Jacky Kindle, MD, 7.5 mg at 09/03/22 0203   sodium chloride flush (NS) 0.9 % injection 10-40 mL, 10-40 mL, Intracatheter, Q12H, Ostergard, Thomas A, MD, 10 mL at 09/03/22 0843   sodium chloride flush (NS) 0.9 % injection 10-40 mL, 10-40 mL, Intracatheter, PRN, Judith Part, MD   sodium chloride flush (NS) 0.9 % injection 3 mL, 3 mL, Intravenous, Q12H, Darrick Meigs, Gagan S, MD, 3 mL at 09/01/22 1200   sodium chloride flush (NS) 0.9 % injection 3 mL, 3 mL, Intravenous, PRN, Darrick Meigs, Marge Duncans, MD   sodium chloride tablet 1 g, 1 g, Per Tube, BID WC, Ventura Sellers, RPH, 1 g at 09/03/22 0840   tamsulosin (FLOMAX) capsule 0.4 mg, 0.4 mg, Oral, QPC supper, Gherghe, Costin M, MD   traZODone (DESYREL) tablet 100 mg, 100 mg, Per Tube, QHS, Chand, Sudham, MD, 100 mg at 09/02/22 2004   vancomycin (VANCOREADY) IVPB 1250 mg/250 mL, 1,250 mg, Intravenous, Q12H, Ostergard, Joyice Faster, MD, Last Rate: 166.7 mL/hr at 09/03/22 1144, 1,250 mg at 09/03/22 1144  Patients Current Diet:  Diet Order             Diet - low sodium heart healthy           Diet regular Fluid consistency: Thin  Diet effective now                  Precautions / Restrictions Precautions Precautions:  Fall, Back Precaution Booklet Issued: Yes (comment) Precaution Comments: trach, cortrack, flexiseal, wound  vac on back, using PMSV Other Brace: prevlon boots Restrictions Weight Bearing Restrictions: No Other Position/Activity Restrictions: external rotation R hip at baseline, pt ambulated household distances PTA, w/c for community   Has the patient had 2 or more falls or a fall with injury in the past year?No  Prior Activity Level Limited Community (1-2x/wk): Mod I with RW in home, used wheelchair for long disctances, drove self  Prior Functional Level Prior Function Prior Level of Function : Needs assist  Cognitive Assist : Mobility (cognitive), ADLs (cognitive) Mobility (Cognitive): Intermittent cues ADLs (Cognitive): Intermittent cues Physical Assist : Mobility (physical), ADLs (physical) Mobility (physical): Bed mobility, Transfers ADLs (physical): Grooming, Bathing, Dressing, IADLs, Toileting, Feeding Mobility Comments: to return home with family 24/7 min asisst ADLs Comments: needed assist with all BADL tasks  Self Care: Did the patient need help bathing, dressing, using the toilet or eating?  Needed some help  Indoor Mobility: Did the patient need assistance with walking from room to room (with or without device)? Independent  Stairs: Did the patient need assistance with internal or external stairs (with or without device)? Independent  Functional Cognition: Did the patient need help planning regular tasks such as shopping or remembering to take medications? Independent  Patient Information Are you of Hispanic, Latino/a,or Spanish origin?: A. No, not of Hispanic, Latino/a, or Spanish origin What is your race?: A. White Do you need or want an interpreter to communicate with a doctor or health care staff?: 0. No  Patient's Response To:  Health Literacy and Transportation Is the patient able to respond to health literacy and transportation needs?: Yes Health Literacy - How often do you need to have someone help you when you read instructions, pamphlets, or other written  material from your doctor or pharmacy?: Never In the past 12 months, has lack of transportation kept you from medical appointments or from getting medications?: No In the past 12 months, has lack of transportation kept you from meetings, work, or from getting things needed for daily living?: No  Development worker, international aid / [place] Devices/Equipment: Wheelchair, Shower chair with back  Prior Device Use: Indicate devices/aids used by the patient prior to current illness, exacerbation or injury? Walker short distances, wheelchair in the community  Current Functional Level Cognition  Arousal/Alertness: Awake/alert Overall Cognitive Status: Impaired/Different from baseline Difficult to assess due to: Tracheostomy Current Attention Level: Sustained Orientation Level: Oriented X4 (forgetful) Following Commands: Follows one step commands with increased time (and multimodal cues) Safety/Judgement: Decreased awareness of safety, Decreased awareness of deficits General Comments: frustrated and perseverating on the fact that she was trying to visit with her sister when PT arrived. Attention: Sustained, Selective Sustained Attention: Impaired Sustained Attention Impairment: Functional complex, Verbal basic Selective Attention: Impaired Selective Attention Impairment: Verbal complex, Functional complex Memory: Impaired Memory Impairment: Retrieval deficit Awareness: Appears intact Problem Solving: Appears intact Safety/Judgment: Appears intact    Extremity Assessment (includes Sensation/Coordination)  Upper Extremity Assessment: RUE deficits/detail, LUE deficits/detail RUE Deficits / Details: improved AROM today but limited by weakness, AAROM for increased range and functional use required RUE Coordination: decreased gross motor, decreased fine motor LUE Deficits / Details: mild edema noted, improved AROM today but limited by weakness, AAROM for increased range and functional use  required LUE Coordination: decreased fine motor, decreased gross motor  Lower Extremity Assessment: Defer to PT evaluation RLE Deficits / Details: Overall weakness, difficulty performing LAQ in  seated position against gravity, grossly ~2/5 knee ex/knee flexion. LLE Deficits / Details: Overall weakness, difficulty performing LAQ in seated position against gravity, able to activtae quads minimally.    ADLs  Overall ADL's : Needs assistance/impaired Eating/Feeding: NPO Eating/Feeding Details (indicate cue type and reason): Declined proper set-up of bed for adequate positioning to eat. Only acceptable of the HOB at ~30-40 degrees. Grooming: Moderate assistance, Oral care Grooming Details (indicate cue type and reason): suction oral care with RUE, required setup and proximal support with cueing for task attention Upper Body Bathing: Total assistance, Bed level Lower Body Bathing: Total assistance, Bed level Upper Body Dressing : Total assistance, Bed level Lower Body Dressing: Total assistance, +2 for physical assistance, +2 for safety/equipment, Sit to/from stand Toilet Transfer: +2 for physical assistance, +2 for safety/equipment, Maximal assistance Toilet Transfer Details (indicate cue type and reason): using stedy Toileting- Clothing Manipulation and Hygiene: Bed level, Total assistance Functional mobility during ADLs: Maximal assistance, +2 for physical assistance, +2 for safety/equipment General ADL Comments: pt easily distracted, completed transfer so she could visit with her son    Mobility  Overal bed mobility: Needs Assistance Bed Mobility: Rolling, Sidelying to Sit Rolling: Mod assist, +2 for safety/equipment Sidelying to sit: +2 for physical assistance, Max assist Supine to sit: Max assist, +2 for physical assistance Sit to supine: +2 for physical assistance, Max assist Sit to sidelying: Max assist, +2 for safety/equipment General bed mobility comments: using rail and bit to help  assist for legs off bed and to lift trunk    Transfers  Overall transfer level: Needs assistance Equipment used: Ambulation equipment used Transfers: Bed to chair/wheelchair/BSC Sit to Stand: Max assist, +2 physical assistance, +2 safety/equipment Bed to/from chair/wheelchair/BSC transfer type:: Squat pivot Stand pivot transfers: Max assist, +2 safety/equipment, +2 physical assistance Squat pivot transfers: +2 physical assistance, Max assist  Lateral/Scoot Transfers: Total assist, +2 physical assistance Transfer via Lift Equipment: [name] General transfer comment: up on L leg mainly for pivot to recliner with armrest lowered and using bed pad to pivot due to skin issued on her back and gait belt not used    Ambulation / Gait / Stairs / Office manager / Balance Dynamic Sitting Balance Sitting balance - Comments: sitting with S about 20 seconds then assisted to prevent L lateral LOB Balance Overall balance assessment: Needs assistance Sitting-balance support: Feet supported Sitting balance-Leahy Scale: Poor Sitting balance - Comments: sitting with S about 20 seconds then assisted to prevent L lateral LOB Postural control: Left lateral lean Standing balance support: Bilateral upper extremity supported Standing balance-Leahy Scale: Zero Standing balance comment: NT    Special needs/care consideration 2/6 # 6 cuffed trach 28 % trach collar 2/2 10 fr cortrak feeding tube left nare  2/10 14 fr urethral catheter WOC Nurse wound follow up 2/16 Wound type:lumbar wound, surgical dehiscence Measurement:not obtained today Wound bed: granulating, edges contracting well  Photo in chart.  Improved. [place] remains at proximal and distal end.  Drainage (amount, consistency, odor) minimal seroaanguinous  no odor Periwound: intact  continues with loose stool, fecal management system in place and no leakage noted today.  Gerhardts to buttocks and incontinence associated skin  breakdown Dressing procedure/placement/frequency:barrier ring to proximal and distal end to promote seal.  1 piece black foam. Covered with drape.  Change Mon.WEd.Fri.  Patient is pleasant and talkative this AM.  Family at bedside.  They observe the wound and are pleased with progress. Will follow.  Estrellita Ludwig MSN, RN, FNP-BC CWON Wound, Ostomy, Continence Nurse [facility] [phone] Pager [phone]    Previous Home Environment  Living Arrangements: Spouse/significant other  Lives With: Spouse Available Help at Discharge: Family, Available 24 hours/day (2 daughters, son , spouse and hired Programmer, multimedia for 24/7) Type of Home: [place] Name: [place]: One level Home Access: Ramped entrance Bathroom Shower/Tub: Multimedia programmer: Handicapped height [place] Accessibility: Yes How Accessible: Accessible via [place]: No Additional Comments: Was Mod I prior to admit in 12/23 with RW short distances and wheelchair long disctances  Discharge Living Setting Plans for Discharge Living Setting: Patient's home, Lives with (comment) (spouse) Type of Home at Discharge: House Discharge Home Layout: One level Discharge Home Access: [place] entrance Discharge Bathroom Shower/Tub: Walk-in shower Discharge Bathroom Toilet: Handicapped height Discharge Bathroom Accessibility: Yes How Accessible: Accessible via walker Does the patient have any problems obtaining your medications?: No  Social/Family/Support Systems Patient Roles: Spouse, Parent Contact Information: spouse, duaghters and son Anticipated Caregiver: spouse, 2 daughters, son and hired caregivers Anticipated Ambulance person Information: see contacts Ability/Limitations of Caregiver: family work, but will arrange schedule and hire assist Caregiver Availability: 24/7 Discharge Plan Discussed with Primary Caregiver: Yes Is Caregiver In Agreement with Plan?: Yes Does  Caregiver/Family have Issues with Lodging/Transportation while Pt is in Rehab?: No  Goals Patient/Family Goal for Rehab: supervision to min with PT and OT, mod I to supervision with SLP Expected length of stay: ELOS 20 to 27 days Pt/Family Agrees to Admission and willing to participate: Yes Program Orientation Provided & Reviewed with Pt/Caregiver Including Roles  & Responsibilities: Yes  Has [employer] ticket for June in [place] and her goal is  to be ambulatory and recovered to go.   Decrease burden of Care through IP rehab admission: n/a  Possible need for SNF placement upon discharge:was at [facility] after last 2 admits since 12/23 and family feel that she did not have enuugh medical and nursing oversight of her complex needs.  Patient Condition: This patient's medical and functional status has changed since the consult dated: 08/31/22 in which the Rehabilitation Physician determined and documented that the patient's condition is appropriate for intensive rehabilitative care in an inpatient rehabilitation facility. See "History of Present Illness" (above) for medical update. Functional changes are: max assist overall. Patient's medical and functional status update has been discussed with the Rehabilitation physician and patient remains appropriate for inpatient rehabilitation. Will admit to inpatient rehab today.  Preadmission Screen Completed By:  Cleatrice Burke, RN, 09/03/2022 1:58 PM ______________________________________________________________________   Discussed status with Dr. Naaman Plummer on 09/03/22 at 1400 and received approval for admission today.  Admission Coordinator:  Cleatrice Burke, time 1400 Date 09/03/22

## 2022-09-02 NOTE — Progress Notes (Signed)
PICC: red lumen patent, gray & white occluded. IV team consulted. Alteplase effective. KVO infusing.  Pt refusing most oral intake & meds po crushed in puree, requests meds in tube. Severe pain to back overnight.   A&Ox4 w/ cueing, short term memory impairment.   Cortrak in place, no skin breakdown.

## 2022-09-02 NOTE — Progress Notes (Addendum)
PROGRESS NOTE  [name] [id number] DOB: 06-Jan-1956 DOA: 08/09/2022 PCP: [name], MD   LOS: 24 days   Brief Narrative / Interim history: 67 year old female with history of scoliosis with prior lumbar fusion, mild memory problems, recurrent UTIs who was admitted 1/22 for complex lumbar wound and visible lumbar spine hardware. Has been admitted twice recently prior to current admission -- first for eval of back pain found to be unstable fracture at prior lumbar fusion site requiring PLIF L1-L5 on 12/9. Discharged to [facility] 12/21. At SNF, wound dehisced and pt presented back to ED same day and was readmitted 12/21-12/29 (second admission), then dc back to SNF. Her complex lumbar wound further deteriorated at SNF and she presented back to ED 1/22 with exposed lumbar hardware and was admitted (current admission). She went to OR 1/22 for revision of L1-L5 fusion, removal of left L1-L5 screws and rod, and removal of right L1 screw, revision of L1-L5 posterior instrumented fusion.  Hospital course complicated by encephalopathy and sepsis requiring ICU transfer, respiratory failure requiring tracheostomy.  Eventually improved and was transferred back to the hospitalist service on 2/15  Significant events: 12/9 - PLIF for unstable L fracture through prior L spine fusion. Admitted 12/9-12/21, dc to SNF 12/21 12/21 - Readmitted 12/21-12/29 for wound dehiscence and dc back to SNF 1/22 - Readmitted for exposed L hardware; OR for removal of left L1-L5 screws, rod and right L1 screw. 1/22 - Started doxycycline. 1/23 - Encephalopathic 1/25 - Started ciprofloxacin 1/26 - Stopped ciprofloxacin, started cefadroxil for possible UTI  1/30 - New leukocytosis, ongoing confusion, fever; TRH consult 1/31 - Worse fever and confusion; abx change to vanc and zosyn 2/1 - Dyspnea, tachycardia. WBC 17.5.  [name] and ID consults. Abx changed to vanc, cefepime, and metronidazole. Admitted to ICU.  Hypoxemic,  intubated.  CTA chest concerning for aspiration PNA. 2/2 - MR brain concerning for meningitis, continue empiric treatment w/ vanc and cefepime 2/6 tracheostomy [employer]/RB 2/13-repeat MRI of the brain unchanged with concern for ventriculitis 2/15-transferred to the hospitalist service  Subjective / 24h Interval events: She appears to be doing well this morning.  Denies any shortness of breath, denies any chest pain, no nausea or vomiting.  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 [name] on 1/22.  Infectious disease were consulted and followed patient while hospitalized.  Given no positive cultures she has been placed on broad-spectrum antibiotics with vancomycin, cefepime as well as metronidazole.  Per ID, IV treatment end date will be for total of 6 weeks with the end date being on 09/30/2022.  Close to that time, will need to be determined whether she needs to switch to p.o. antibiotics for suppressive purposes.  She will have repeat MRI in 4 weeks per Dr. [name], and follow-up in ID clinic  before finishing intravenous antibiotics  Active problems Chronic respiratory failure, s/p trach - S/p tracheostomy 2/06, appreciate [name] follow-up.  She is doing well  with PMV, eating  Hyponatremia with hypochloremia - continue salt tabs.  Recheck sodium today  Acute encephalopathy /ICU induced delirium -appears significantly improved.  Still off at times per daughters but much better than when she was in the ICU.  Continue to encourage mobilization, and continue to progress medically   Hypokalemia -Continue to replete, goal 4.  Recheck levels today   Anemia of critical illness -Follow intermittently, repeat CBC today   DM type 2 -CBG + SSI   CBG (last 3)  Recent Labs    09/01/22 1056 09/01/22 1517 09/02/22 0523  GLUCAP 148* 135* 131*    Chronic combined CHF, CAD, HTN -recent 2D echo done 08/20/2022 showed apical hypokinesis, LVEF 40-45%, RV function was reduced.  Continue Coreg.  Intermittently diuresed, most recent furosemide 2/11.  She is net -3.4 L and currently appears euvolemic on exam.  Labs this morning are pending, hold further furosemide for now   Urine retention -Foley in place.  Apparently she has tried couple of voiding trials in the past and failed having to had the Foley reinserted.  I will reach out to urology to see what they recommend.  Started on bethanechol   Hx of Bipolar, mild cognitive deficit -Plan to continue: aricept, neurontin, lamitcal and trazadone   Hx of hypothyroidism -Continue levothyroxine.  Most recent TSH 2/1 was 1.0.   Moderate malnutrition -continue tube feeds.  With her being more and more awake will try to see if we can only do night feeds and allow her diet during daytime.   Debility -Working with PT/OT/SLP.  CIR is evaluating  Anemia of chronic disease -hemoglobin this morning is pending.  Check anemia panel given macrocytosis   Scheduled Meds:  bethanechol  10 mg Per Tube TID   buprenorphine  1 patch Transdermal Weekly   carvedilol  12.5 mg Per Tube BID WC   Chlorhexidine Gluconate Cloth  6 each Topical Daily   cyclobenzaprine  5 mg Per Tube TID   donepezil  10 mg Per Tube Daily   famotidine  20 mg  Per Tube BID   fluvoxaMINE  100 mg Per Tube QHS   gabapentin  100 mg Per Tube Q8H   [name]'s butt cream   Topical Daily   heparin  5,000 Units Subcutaneous Q8H   insulin aspart  0-15 Units Subcutaneous Q4H   lamoTRIgine  200 mg Per Tube Daily   levothyroxine  88 mcg Per Tube QAC breakfast   multivitamin with minerals  1 tablet Per Tube Daily   mouth rinse  15 mL Mouth Rinse 4 times per day   sodium chloride flush  10-40 mL Intracatheter Q12H   sodium chloride flush  3 mL Intravenous Q12H   sodium chloride  1 g Per Tube BID WC   traZODone  100 mg Per Tube QHS   Continuous Infusions:  sodium chloride Stopped (08/29/22 2159)   ceFEPime (MAXIPIME) IV 2 g (09/02/22 0841)   feeding supplement (GLUCERNA 1.5 CAL) 1,000 mL (09/01/22 2000)   metronidazole 500 mg (09/02/22 0552)   vancomycin 1,250 mg (09/02/22 0033)   PRN Meds:.sodium chloride, acetaminophen **OR** acetaminophen, diphenoxylate-atropine, fentaNYL (SUBLIMAZE) injection, ipratropium-albuterol, labetalol, melatonin, menthol-cetylpyridinium **OR** [DISCONTINUED] phenol, ondansetron **OR** ondansetron (ZOFRAN) IV, mouth rinse, mouth rinse, oxyCODONE, sodium chloride flush, sodium chloride flush  Current Outpatient Medications  Medication Instructions   acetaminophen (TYLENOL)  1,000 mg, Oral, 3 times daily, (0800, 1400 & 2000)   amLODipine (NORVASC) 5 mg, Oral, Every morning, (0730)   buPROPion (WELLBUTRIN XL) 300 mg, Oral, Every morning,  (0730)   carvedilol (COREG) 6.25 mg, Oral, 2 times daily, (0730 & 2000)   cyclobenzaprine (FLEXERIL) 5 mg, Oral, 3 times daily, (0800, 1300 & 2000)   donepezil (ARICEPT) 10 mg, Oral, Daily   fluvoxaMINE (LUVOX) 100 mg, Oral, Daily at bedtime   gabapentin (NEURONTIN) 200 mg, Oral, 3 times daily, (0800, 1400 & 2000)   lamoTRIgine (LAMICTAL) 200 mg, Oral, Daily   levothyroxine (SYNTHROID) 88 mcg, Oral, Daily before breakfast   loperamide (IMODIUM) 2-4 mg, Oral, See admin instructions, Take 1  capsule (2 mg) by mouth once daily scheduled & take 1-2 capsules (2-4 mg) by mouth once daily as needed for additional loose stools.   losartan (COZAAR) 100 mg, Oral, Daily at bedtime   metFORMIN (GLUCOPHAGE) 500 mg, Oral, 2 times daily   Multiple Vitamin (MULTIVITAMIN) tablet 1 tablet, Oral, Daily,     nicotine polacrilex (NICORETTE) 2 mg, Oral, Daily   ondansetron (ZOFRAN) 4 mg, Oral, Every 6 hours PRN   Ozempic (1 MG/DOSE) 2 mg, Subcutaneous, Every Mon   Ozempic (2 MG/DOSE) 2 mg, Subcutaneous, Every Mon   pantoprazole (PROTONIX) 80 mg, Oral, Daily at bedtime   QUEtiapine (SEROQUEL) 600 mg, Oral, Daily at bedtime,     sodium chloride 1 g, Oral, Every other day, (0900)   traMADol (ULTRAM) 25 mg, Oral, Every 6 hours PRN   traZODone (DESYREL) 100 mg, Oral, Daily at bedtime    Diet Orders (From admission, onward)     Start     Ordered   08/30/22 1301  Diet regular Fluid consistency: Thin  Diet effective now       Comments: Meds whole in puree, always wear purple valve with oral intake  Question:  Fluid consistency:  Answer:  Thin   08/30/22 1300            DVT prophylaxis: heparin injection 5,000 Units Start: 08/24/22 2200 Place TED hose Start: 08/11/22 0927 SCD's Start: 08/09/22 1705   Lab Results  Component Value Date   PLT 588 (H) 08/31/2022      Code Status: Full Code  Family Communication: 2 daughters present at bedside  Status is: Inpatient  Remains inpatient appropriate because: severity of illness  Level of care: Progressive  Objective: Vitals:   09/01/22 2341 09/02/22 0300 09/02/22 0747 09/02/22 0834  BP: 127/79 132/76 135/85   Pulse: 82 81 94 94  Resp: 12 12 16 18  $ Temp: 98.1 F (36.7 C) 97.9 F (36.6 C) 98.4 F (36.9 C)   TempSrc: Axillary Oral Oral   SpO2: 98% 99% 100% 100%  Weight:      Height:        Intake/Output Summary (Last 24 hours) at 09/02/2022 [id number] Last data filed at 09/01/2022 1940 Gross per 24 hour  Intake 600.03 ml  Output 1900  ml  Net -1299.97 ml   Wt Readings from Last 3 Encounters:  06/26/22 72.6 kg  04/15/22 75.5 kg  02/10/22 75.3 kg    Examination:  Constitutional: NAD Eyes: no scleral icterus ENMT: Mucous membranes are moist.  Neck: normal, supple, tracheostomy in place with PMV on Respiratory: clear to auscultation bilaterally, no wheezing, no crackles. Normal respiratory effort.  Cardiovascular: Regular rate and rhythm, no murmurs / rubs / gallops. No LE edema.  Abdomen: non distended, no tenderness. Bowel sounds positive.  Musculoskeletal: no clubbing / cyanosis.  Neurologic: non focal   Data Reviewed: I have independently reviewed following labs and imaging studies   CBC Recent Labs  Lab 08/28/22 0450 08/29/22 0355 08/30/22 0358 08/31/22 0546  WBC 10.4 9.3 8.6 8.8  HGB 8.9* 8.8* 8.5* 8.6*  HCT 27.6* 26.4* 25.9* 27.1*  PLT 646* 548* 500* 588*  MCV 100.4* 99.2 102.0* 103.0*  MCH 32.4 33.1 33.5 32.7  MCHC 32.2 33.3 32.8 31.7  RDW 19.9* 19.9* 20.5* 20.1*  LYMPHSABS 1.5  --   --  1.3  MONOABS 1.0  --   --  1.0  EOSABS 0.0  --   --  0.1  BASOSABS 0.1  --   --  0.1    Recent Labs  Lab 08/27/22 0102 08/27/22 2354 08/28/22 0450 08/29/22 0355 08/30/22 0358 08/31/22 0546  NA 128* 129* 130* 125* 132* 130*  K 3.1* 4.0 3.8 3.4* 3.7 3.7  CL 92* 91* 91* 87* 98 94*  CO2 28 27 29 28 27 26  $ GLUCOSE 104* 110* 130* 325* 124* 109*  BUN 9 9 7* 9 7* 9  CREATININE 0.32* <0.30* <0.30* <0.30* <0.30* <0.30*  CALCIUM 7.5* 8.0* 7.9* 7.7* 7.6* 8.1*  AST  --   --   --   --   --  21  ALT  --   --   --   --   --  13  ALKPHOS  --   --   --   --   --  60  BILITOT  --   --   --   --   --  0.5  ALBUMIN  --   --   --   --   --  2.0*  MG 1.8 2.0 2.0  --  1.7 1.8    ------------------------------------------------------------------------------------------------------------------ No results for input(s): "CHOL", "HDL", "LDLCALC", "TRIG", "CHOLHDL", "LDLDIRECT" in the last 72 hours.  Lab Results   Component Value Date   HGBA1C 6.0 (H) 07/08/2022   ------------------------------------------------------------------------------------------------------------------ No results for input(s): "TSH", "T4TOTAL", "T3FREE", "THYROIDAB" in the last 72 hours.  Invalid input(s): "FREET3"  Cardiac Enzymes No results for input(s): "CKMB", "TROPONINI", "MYOGLOBIN" in the last 168 hours.  Invalid input(s): "CK" ------------------------------------------------------------------------------------------------------------------    Component Value Date/Time   BNP 1,213.8 (H) 08/19/2022 1515    CBG: Recent Labs  Lab 09/01/22 0321 09/01/22 0816 09/01/22 1056 09/01/22 1517 09/02/22 0523  GLUCAP 110* 168* 148* 135* 131*    Recent Results (from the past 240 hour(s))  Culture, Respiratory w Gram Stain     Status: None   Collection Time: 08/24/22  9:07 AM   Specimen: Tracheal Aspirate; Respiratory  Result Value Ref Range Status   Specimen Description TRACHEAL ASPIRATE  Final   Special Requests NONE  Final   Gram Stain   Final    MODERATE WBC PRESENT, PREDOMINANTLY PMN NO ORGANISMS SEEN Performed at [facility], [address] [place], [place] [address]    Culture   Final    MODERATE CANDIDA ALBICANS FUNGUS (MOLD) ISOLATED, PROBABLE CONTAMINANT/COLONIZER (SAPROPHYTE). CONTACT MICROBIOLOGY IF FURTHER IDENTIFICATION REQUIRED [phone].    Report Status 08/26/2022 FINAL  Final  Culture, Respiratory w Gram Stain     Status: None   Collection Time: 08/24/22  2:55 PM   Specimen: Bronchoalveolar Lavage; Respiratory  Result Value Ref Range Status   Specimen Description BRONCHIAL ALVEOLAR LAVAGE  Final   Special Requests NONE  Final   Gram Stain   Final  ABUNDANT WBC PRESENT, PREDOMINANTLY PMN NO ORGANISMS SEEN Performed at [place] [address] [place], [place] [address]    Culture   Final    RARE CANDIDA ALBICANS FUNGUS (MOLD) ISOLATED, PROBABLE  CONTAMINANT/COLONIZER (SAPROPHYTE). CONTACT MICROBIOLOGY IF FURTHER IDENTIFICATION REQUIRED [phone].    Report Status 08/27/2022 FINAL  Final     Radiology Studies: No results found.   Marzetta Board, MD, PhD [facility]  Between 7 am - 7 pm I am available, please contact me via Amion (for emergencies) or Securechat (non urgent messages)  Between 7 pm - 7 am I am not available, please contact night coverage MD/APP via Amion

## 2022-09-03 ENCOUNTER — Encounter (HOSPITAL_COMMUNITY): Payer: Self-pay | Admitting: Neurological Surgery

## 2022-09-03 ENCOUNTER — Inpatient Hospital Stay (HOSPITAL_COMMUNITY): Payer: Medicare Other

## 2022-09-03 ENCOUNTER — Encounter (HOSPITAL_COMMUNITY): Payer: Self-pay | Admitting: Physical Medicine & Rehabilitation

## 2022-09-03 ENCOUNTER — Inpatient Hospital Stay (HOSPITAL_COMMUNITY)
Admission: RE | Admit: 2022-09-03 | Discharge: 2022-10-13 | DRG: 945 | Disposition: A | Discharge status: Home Health | Payer: Medicare Other | Source: Intra-hospital | Attending: Physical Medicine & Rehabilitation | Admitting: Physical Medicine & Rehabilitation

## 2022-09-03 ENCOUNTER — Other Ambulatory Visit: Payer: Self-pay

## 2022-09-03 DIAGNOSIS — R339 Retention of urine, unspecified: Secondary | ICD-10-CM | POA: Diagnosis present

## 2022-09-03 DIAGNOSIS — R5381 Other malaise: Secondary | ICD-10-CM | POA: Diagnosis present

## 2022-09-03 DIAGNOSIS — Z96612 Presence of left artificial shoulder joint: Secondary | ICD-10-CM | POA: Diagnosis present

## 2022-09-03 DIAGNOSIS — E871 Hypo-osmolality and hyponatremia: Secondary | ICD-10-CM | POA: Diagnosis present

## 2022-09-03 DIAGNOSIS — D6489 Other specified anemias: Secondary | ICD-10-CM | POA: Diagnosis present

## 2022-09-03 DIAGNOSIS — T847XXS Infection and inflammatory reaction due to other internal orthopedic prosthetic devices, implants and grafts, sequela: Secondary | ICD-10-CM | POA: Diagnosis not present

## 2022-09-03 DIAGNOSIS — T847XXD Infection and inflammatory reaction due to other internal orthopedic prosthetic devices, implants and grafts, subsequent encounter: Secondary | ICD-10-CM | POA: Diagnosis not present

## 2022-09-03 DIAGNOSIS — M869 Osteomyelitis, unspecified: Secondary | ICD-10-CM | POA: Diagnosis present

## 2022-09-03 DIAGNOSIS — D638 Anemia in other chronic diseases classified elsewhere: Secondary | ICD-10-CM | POA: Diagnosis present

## 2022-09-03 DIAGNOSIS — E1165 Type 2 diabetes mellitus with hyperglycemia: Secondary | ICD-10-CM | POA: Diagnosis present

## 2022-09-03 DIAGNOSIS — D62 Acute posthemorrhagic anemia: Secondary | ICD-10-CM | POA: Insufficient documentation

## 2022-09-03 DIAGNOSIS — G4700 Insomnia, unspecified: Secondary | ICD-10-CM | POA: Diagnosis present

## 2022-09-03 DIAGNOSIS — K121 Other forms of stomatitis: Secondary | ICD-10-CM | POA: Diagnosis not present

## 2022-09-03 DIAGNOSIS — Z79899 Other long term (current) drug therapy: Secondary | ICD-10-CM

## 2022-09-03 DIAGNOSIS — J690 Pneumonitis due to inhalation of food and vomit: Secondary | ICD-10-CM | POA: Diagnosis present

## 2022-09-03 DIAGNOSIS — Z9911 Dependence on respirator [ventilator] status: Secondary | ICD-10-CM | POA: Diagnosis not present

## 2022-09-03 DIAGNOSIS — F4323 Adjustment disorder with mixed anxiety and depressed mood: Secondary | ICD-10-CM | POA: Diagnosis present

## 2022-09-03 DIAGNOSIS — E039 Hypothyroidism, unspecified: Secondary | ICD-10-CM | POA: Diagnosis present

## 2022-09-03 DIAGNOSIS — R112 Nausea with vomiting, unspecified: Secondary | ICD-10-CM | POA: Diagnosis not present

## 2022-09-03 DIAGNOSIS — E878 Other disorders of electrolyte and fluid balance, not elsewhere classified: Secondary | ICD-10-CM | POA: Diagnosis present

## 2022-09-03 DIAGNOSIS — G9340 Encephalopathy, unspecified: Secondary | ICD-10-CM | POA: Diagnosis present

## 2022-09-03 DIAGNOSIS — G3184 Mild cognitive impairment, so stated: Secondary | ICD-10-CM | POA: Diagnosis present

## 2022-09-03 DIAGNOSIS — G009 Bacterial meningitis, unspecified: Secondary | ICD-10-CM | POA: Diagnosis not present

## 2022-09-03 DIAGNOSIS — R197 Diarrhea, unspecified: Secondary | ICD-10-CM | POA: Diagnosis not present

## 2022-09-03 DIAGNOSIS — M4626 Osteomyelitis of vertebra, lumbar region: Principal | ICD-10-CM | POA: Diagnosis present

## 2022-09-03 DIAGNOSIS — Z930 Tracheostomy status: Secondary | ICD-10-CM | POA: Diagnosis not present

## 2022-09-03 DIAGNOSIS — G039 Meningitis, unspecified: Secondary | ICD-10-CM | POA: Diagnosis not present

## 2022-09-03 DIAGNOSIS — E876 Hypokalemia: Secondary | ICD-10-CM | POA: Diagnosis present

## 2022-09-03 DIAGNOSIS — E440 Moderate protein-calorie malnutrition: Secondary | ICD-10-CM | POA: Diagnosis present

## 2022-09-03 DIAGNOSIS — F39 Unspecified mood [affective] disorder: Secondary | ICD-10-CM | POA: Diagnosis not present

## 2022-09-03 DIAGNOSIS — T8130XA Disruption of wound, unspecified, initial encounter: Secondary | ICD-10-CM | POA: Diagnosis not present

## 2022-09-03 DIAGNOSIS — I10 Essential (primary) hypertension: Secondary | ICD-10-CM | POA: Diagnosis not present

## 2022-09-03 DIAGNOSIS — R1310 Dysphagia, unspecified: Secondary | ICD-10-CM | POA: Diagnosis not present

## 2022-09-03 DIAGNOSIS — F5101 Primary insomnia: Secondary | ICD-10-CM | POA: Diagnosis present

## 2022-09-03 DIAGNOSIS — K219 Gastro-esophageal reflux disease without esophagitis: Secondary | ICD-10-CM | POA: Diagnosis present

## 2022-09-03 DIAGNOSIS — F3175 Bipolar disorder, in partial remission, most recent episode depressed: Secondary | ICD-10-CM | POA: Diagnosis present

## 2022-09-03 DIAGNOSIS — R4586 Emotional lability: Secondary | ICD-10-CM | POA: Diagnosis not present

## 2022-09-03 DIAGNOSIS — E119 Type 2 diabetes mellitus without complications: Secondary | ICD-10-CM

## 2022-09-03 DIAGNOSIS — I110 Hypertensive heart disease with heart failure: Secondary | ICD-10-CM | POA: Diagnosis present

## 2022-09-03 DIAGNOSIS — T148XXA Other injury of unspecified body region, initial encounter: Secondary | ICD-10-CM

## 2022-09-03 DIAGNOSIS — R739 Hyperglycemia, unspecified: Secondary | ICD-10-CM | POA: Diagnosis not present

## 2022-09-03 DIAGNOSIS — Q210 Ventricular septal defect: Secondary | ICD-10-CM | POA: Diagnosis not present

## 2022-09-03 DIAGNOSIS — I5042 Chronic combined systolic (congestive) and diastolic (congestive) heart failure: Secondary | ICD-10-CM | POA: Diagnosis present

## 2022-09-03 DIAGNOSIS — M546 Pain in thoracic spine: Secondary | ICD-10-CM | POA: Diagnosis present

## 2022-09-03 DIAGNOSIS — R0789 Other chest pain: Secondary | ICD-10-CM | POA: Diagnosis not present

## 2022-09-03 DIAGNOSIS — F05 Delirium due to known physiological condition: Secondary | ICD-10-CM | POA: Diagnosis present

## 2022-09-03 DIAGNOSIS — K529 Noninfective gastroenteritis and colitis, unspecified: Secondary | ICD-10-CM | POA: Diagnosis present

## 2022-09-03 DIAGNOSIS — E1169 Type 2 diabetes mellitus with other specified complication: Secondary | ICD-10-CM | POA: Diagnosis present

## 2022-09-03 DIAGNOSIS — R110 Nausea: Secondary | ICD-10-CM | POA: Diagnosis not present

## 2022-09-03 DIAGNOSIS — R600 Localized edema: Secondary | ICD-10-CM | POA: Diagnosis not present

## 2022-09-03 DIAGNOSIS — E7800 Pure hypercholesterolemia, unspecified: Secondary | ICD-10-CM | POA: Diagnosis present

## 2022-09-03 DIAGNOSIS — G8929 Other chronic pain: Secondary | ICD-10-CM

## 2022-09-03 DIAGNOSIS — Z7984 Long term (current) use of oral hypoglycemic drugs: Secondary | ICD-10-CM

## 2022-09-03 DIAGNOSIS — F17210 Nicotine dependence, cigarettes, uncomplicated: Secondary | ICD-10-CM | POA: Diagnosis present

## 2022-09-03 DIAGNOSIS — M25512 Pain in left shoulder: Secondary | ICD-10-CM | POA: Diagnosis not present

## 2022-09-03 DIAGNOSIS — S31000A Unspecified open wound of lower back and pelvis without penetration into retroperitoneum, initial encounter: Secondary | ICD-10-CM | POA: Diagnosis not present

## 2022-09-03 DIAGNOSIS — F419 Anxiety disorder, unspecified: Secondary | ICD-10-CM | POA: Diagnosis present

## 2022-09-03 DIAGNOSIS — A86 Unspecified viral encephalitis: Secondary | ICD-10-CM | POA: Diagnosis not present

## 2022-09-03 DIAGNOSIS — T847XXA Infection and inflammatory reaction due to other internal orthopedic prosthetic devices, implants and grafts, initial encounter: Secondary | ICD-10-CM | POA: Diagnosis present

## 2022-09-03 DIAGNOSIS — F319 Bipolar disorder, unspecified: Secondary | ICD-10-CM | POA: Diagnosis not present

## 2022-09-03 LAB — CBC
HCT: 25.3 % — ABNORMAL LOW (ref 36.0–46.0)
Hemoglobin: 8.4 g/dL — ABNORMAL LOW (ref 12.0–15.0)
MCH: 33.3 pg (ref 26.0–34.0)
MCHC: 33.2 g/dL (ref 30.0–36.0)
MCV: 100.4 fL — ABNORMAL HIGH (ref 80.0–100.0)
Platelets: 403 10*3/uL — ABNORMAL HIGH (ref 150–400)
RBC: 2.52 MIL/uL — ABNORMAL LOW (ref 3.87–5.11)
RDW: 20.4 % — ABNORMAL HIGH (ref 11.5–15.5)
WBC: 7.8 10*3/uL (ref 4.0–10.5)
nRBC: 0 % (ref 0.0–0.2)

## 2022-09-03 LAB — IRON AND TIBC
Iron: 57 ug/dL (ref 28–170)
Saturation Ratios: 35 % — ABNORMAL HIGH (ref 10.4–31.8)
TIBC: 162 ug/dL — ABNORMAL LOW (ref 250–450)
UIBC: 105 ug/dL

## 2022-09-03 LAB — MAGNESIUM: Magnesium: 1.7 mg/dL (ref 1.7–2.4)

## 2022-09-03 LAB — FOLATE: Folate: 12.3 ng/mL (ref >5.9)

## 2022-09-03 LAB — COMPREHENSIVE METABOLIC PANEL
ALT: 10 U/L (ref 0–44)
AST: 19 U/L (ref 15–41)
Albumin: 1.9 g/dL — ABNORMAL LOW (ref 3.5–5.0)
Alkaline Phosphatase: 60 U/L (ref 38–126)
Anion gap: 10 (ref 5–15)
BUN: 7 mg/dL — ABNORMAL LOW (ref 8–23)
CO2: 26 mmol/L (ref 22–32)
Calcium: 7.9 mg/dL — ABNORMAL LOW (ref 8.9–10.3)
Chloride: 94 mmol/L — ABNORMAL LOW (ref 98–111)
Creatinine, Ser: <0.3 mg/dL — ABNORMAL LOW (ref 0.44–1.00)
Glucose, Bld: 137 mg/dL — ABNORMAL HIGH (ref 70–99)
Potassium: 3.2 mmol/L — ABNORMAL LOW (ref 3.5–5.1)
Sodium: 130 mmol/L — ABNORMAL LOW (ref 135–145)
Total Bilirubin: 0.2 mg/dL — ABNORMAL LOW (ref 0.3–1.2)
Total Protein: 4.8 g/dL — ABNORMAL LOW (ref 6.5–8.1)

## 2022-09-03 LAB — RETICULOCYTES
Immature Retic Fract: 26.7 % — ABNORMAL HIGH (ref 2.3–15.9)
RBC.: 2.57 MIL/uL — ABNORMAL LOW (ref 3.87–5.11)
Retic Count, Absolute: 173.2 10*3/uL (ref 19.0–186.0)
Retic Ct Pct: 6.7 % — ABNORMAL HIGH (ref 0.4–3.1)

## 2022-09-03 LAB — GLUCOSE, CAPILLARY
Glucose-Capillary: 129 mg/dL — ABNORMAL HIGH (ref 70–99)
Glucose-Capillary: 98 mg/dL (ref 70–99)

## 2022-09-03 LAB — VITAMIN B12: Vitamin B-12: 498 pg/mL (ref 180–914)

## 2022-09-03 LAB — FERRITIN: Ferritin: 80 ng/mL (ref 11–307)

## 2022-09-03 MED ORDER — ADULT MULTIVITAMIN W/MINERALS CH: 1.0000 | ORAL_TABLET | Freq: Every day | ORAL | Status: DC

## 2022-09-03 MED ORDER — GLUCERNA 1.5 CAL PO LIQD: 1000.0000 mL | ORAL | Status: DC

## 2022-09-03 MED ORDER — METRONIDAZOLE 500 MG/100ML IV SOLN
500.0000 mg | Freq: Three times a day (TID) | INTRAVENOUS | Status: DC
  Administered 2022-09-03 – 2022-09-16 (×38): 500 mg via INTRAVENOUS
  Filled 2022-09-03 (×39): qty 100

## 2022-09-03 MED ORDER — SACCHAROMYCES BOULARDII 250 MG PO CAPS
250.0000 mg | ORAL_CAPSULE | Freq: Two times a day (BID) | ORAL | Status: DC
  Administered 2022-09-03 – 2022-09-06 (×6): 250 mg
  Filled 2022-09-03 (×6): qty 1

## 2022-09-03 MED ORDER — MAGNESIUM SULFATE 2 GM/50ML IV SOLN
2.0000 g | Freq: Once | INTRAVENOUS | Status: AC
  Administered 2022-09-03: 2 g via INTRAVENOUS
  Filled 2022-09-03: qty 50

## 2022-09-03 MED ORDER — SODIUM CHLORIDE 0.9% FLUSH
10.0000 mL | Freq: Two times a day (BID) | INTRAVENOUS | Status: DC
  Administered 2022-09-04 – 2022-09-17 (×15): 10 mL
  Administered 2022-09-18: 30 mL
  Administered 2022-09-18: 10 mL
  Administered 2022-09-19: 30 mL
  Administered 2022-09-19 – 2022-10-01 (×13): 10 mL

## 2022-09-03 MED ORDER — BETHANECHOL CHLORIDE 10 MG PO TABS: 10.0000 mg | ORAL_TABLET | Freq: Three times a day (TID) | ORAL | Status: DC

## 2022-09-03 MED ORDER — POTASSIUM CHLORIDE 20 MEQ PO PACK
40.0000 meq | PACK | ORAL | Status: AC
  Administered 2022-09-03 (×2): 40 meq
  Filled 2022-09-03 (×2): qty 2

## 2022-09-03 MED ORDER — OXYCODONE HCL 5 MG PO TABS
7.5000 mg | ORAL_TABLET | ORAL | Status: DC | PRN
  Administered 2022-09-03 – 2022-09-05 (×4): 7.5 mg
  Filled 2022-09-03 (×5): qty 2

## 2022-09-03 MED ORDER — SODIUM CHLORIDE 1 G PO TABS
1.0000 g | ORAL_TABLET | Freq: Two times a day (BID) | ORAL | Status: DC
  Administered 2022-09-04 – 2022-09-06 (×5): 1 g
  Filled 2022-09-03 (×5): qty 1

## 2022-09-03 MED ORDER — ORAL CARE MOUTH RINSE: 15.0000 mL | OROMUCOSAL | Status: DC | PRN

## 2022-09-03 MED ORDER — VANCOMYCIN HCL 1250 MG/250ML IV SOLN: 1250.0000 mg | Freq: Two times a day (BID) | INTRAVENOUS | Status: DC

## 2022-09-03 MED ORDER — MELATONIN 5 MG PO TABS: 5.0000 mg | ORAL_TABLET | Freq: Every evening | ORAL | Status: DC | PRN

## 2022-09-03 MED ORDER — BUPRENORPHINE 5 MCG/HR TD PTWK
1.0000 | MEDICATED_PATCH | TRANSDERMAL | Status: DC
  Administered 2022-09-07 – 2022-09-28 (×4): 1 via TRANSDERMAL
  Filled 2022-09-03 (×4): qty 1

## 2022-09-03 MED ORDER — PROCHLORPERAZINE EDISYLATE 10 MG/2ML IJ SOLN: 5.0000 mg | Freq: Four times a day (QID) | INTRAMUSCULAR | Status: DC | PRN

## 2022-09-03 MED ORDER — OXYCODONE HCL 7.5 MG PO TABS: 7.5000 mg | ORAL_TABLET | ORAL | 0 refills | Status: DC | PRN

## 2022-09-03 MED ORDER — MELATONIN 5 MG PO TABS: 5.0000 mg | ORAL_TABLET | Freq: Every evening | ORAL | 0 refills | Status: DC | PRN

## 2022-09-03 MED ORDER — DONEPEZIL HCL 10 MG PO TABS
10.0000 mg | ORAL_TABLET | Freq: Every day | ORAL | Status: DC
  Administered 2022-09-04 – 2022-09-06 (×3): 10 mg
  Filled 2022-09-03 (×3): qty 1

## 2022-09-03 MED ORDER — GLUCERNA 1.5 CAL PO LIQD
1000.0000 mL | ORAL | Status: DC
  Filled 2022-09-03 (×2): qty 1000

## 2022-09-03 MED ORDER — TAMSULOSIN HCL 0.4 MG PO CAPS: 0.4000 mg | ORAL_CAPSULE | Freq: Every day | ORAL | Status: DC

## 2022-09-03 MED ORDER — SODIUM CHLORIDE 0.9 % IV SOLN
2.0000 g | Freq: Three times a day (TID) | INTRAVENOUS | Status: AC
  Administered 2022-09-04 – 2022-09-30 (×80): 2 g via INTRAVENOUS
  Filled 2022-09-03 (×83): qty 12.5

## 2022-09-03 MED ORDER — GUAIFENESIN-DM 100-10 MG/5ML PO SYRP: 5.0000 mL | ORAL_SOLUTION | Freq: Four times a day (QID) | ORAL | Status: DC | PRN

## 2022-09-03 MED ORDER — INSULIN ASPART 100 UNIT/ML IJ SOLN
0.0000 [IU] | INTRAMUSCULAR | Status: DC
  Administered 2022-09-03 – 2022-09-04 (×2): 2 [IU] via SUBCUTANEOUS
  Administered 2022-09-04: 3 [IU] via SUBCUTANEOUS

## 2022-09-03 MED ORDER — ALUM & MAG HYDROXIDE-SIMETH 200-200-20 MG/5ML PO SUSP
30.0000 mL | ORAL | Status: DC | PRN
  Administered 2022-09-04: 30 mL
  Filled 2022-09-03: qty 30

## 2022-09-03 MED ORDER — PROCHLORPERAZINE MALEATE 5 MG PO TABS
5.0000 mg | ORAL_TABLET | Freq: Four times a day (QID) | ORAL | Status: DC | PRN
  Administered 2022-09-03: 10 mg
  Filled 2022-09-03: qty 2
  Filled 2022-09-03: qty 1

## 2022-09-03 MED ORDER — GERHARDT'S BUTT CREAM
TOPICAL_CREAM | Freq: Every day | CUTANEOUS | Status: DC
  Administered 2022-09-18: 1 via TOPICAL
  Filled 2022-09-03 (×2): qty 1

## 2022-09-03 MED ORDER — LABETALOL HCL 5 MG/ML IV SOLN: 10.0000 mg | INTRAVENOUS | Status: DC | PRN

## 2022-09-03 MED ORDER — DIPHENHYDRAMINE HCL 12.5 MG/5ML PO ELIX: 12.5000 mg | ORAL_SOLUTION | Freq: Four times a day (QID) | ORAL | Status: DC | PRN

## 2022-09-03 MED ORDER — FLUVOXAMINE MALEATE 100 MG PO TABS
100.0000 mg | ORAL_TABLET | Freq: Every day | ORAL | Status: DC
  Administered 2022-09-03 – 2022-09-05 (×3): 100 mg
  Filled 2022-09-03 (×3): qty 1

## 2022-09-03 MED ORDER — METRONIDAZOLE 500 MG/100ML IV SOLN: 500.0000 mg | Freq: Three times a day (TID) | INTRAVENOUS | Status: DC

## 2022-09-03 MED ORDER — DIPHENOXYLATE-ATROPINE 2.5-0.025 MG PO TABS: 1.0000 | ORAL_TABLET | Freq: Four times a day (QID) | ORAL | 0 refills | Status: DC | PRN

## 2022-09-03 MED ORDER — ENSURE ENLIVE PO LIQD: 237.0000 mL | Freq: Three times a day (TID) | ORAL | 12 refills | Status: DC

## 2022-09-03 MED ORDER — FAMOTIDINE 20 MG PO TABS
20.0000 mg | ORAL_TABLET | Freq: Two times a day (BID) | ORAL | Status: DC
  Administered 2022-09-03 – 2022-09-06 (×6): 20 mg
  Filled 2022-09-03 (×6): qty 1

## 2022-09-03 MED ORDER — ACETAMINOPHEN 325 MG PO TABS
325.0000 mg | ORAL_TABLET | ORAL | Status: DC | PRN
  Administered 2022-09-04 – 2022-09-06 (×2): 650 mg
  Filled 2022-09-03 (×2): qty 2

## 2022-09-03 MED ORDER — GABAPENTIN 250 MG/5ML PO SOLN
100.0000 mg | Freq: Three times a day (TID) | ORAL | Status: DC
  Administered 2022-09-03: 100 mg
  Filled 2022-09-03 (×4): qty 2

## 2022-09-03 MED ORDER — TRAZODONE HCL 50 MG PO TABS
100.0000 mg | ORAL_TABLET | Freq: Every day | ORAL | Status: DC
  Administered 2022-09-03 – 2022-09-05 (×3): 100 mg
  Filled 2022-09-03 (×4): qty 2

## 2022-09-03 MED ORDER — BISACODYL 10 MG RE SUPP: 10.0000 mg | Freq: Every day | RECTAL | Status: DC | PRN

## 2022-09-03 MED ORDER — TAMSULOSIN HCL 0.4 MG PO CAPS
0.4000 mg | ORAL_CAPSULE | Freq: Every day | ORAL | Status: DC
  Administered 2022-09-03: 0.4 mg via ORAL
  Filled 2022-09-03: qty 1

## 2022-09-03 MED ORDER — FLEET ENEMA 7-19 GM/118ML RE ENEM: 1.0000 | ENEMA | Freq: Once | RECTAL | Status: DC | PRN

## 2022-09-03 MED ORDER — ENSURE ENLIVE PO LIQD
237.0000 mL | Freq: Three times a day (TID) | ORAL | Status: DC
  Administered 2022-09-03 – 2022-09-06 (×6): 237 mL via ORAL

## 2022-09-03 MED ORDER — ENOXAPARIN SODIUM 40 MG/0.4ML IJ SOSY
40.0000 mg | PREFILLED_SYRINGE | INTRAMUSCULAR | Status: DC
  Administered 2022-09-03 – 2022-10-12 (×40): 40 mg via SUBCUTANEOUS
  Filled 2022-09-03 (×40): qty 0.4

## 2022-09-03 MED ORDER — CHLORHEXIDINE GLUCONATE CLOTH 2 % EX PADS
6.0000 | MEDICATED_PAD | Freq: Every day | CUTANEOUS | Status: DC
  Administered 2022-09-04 – 2022-09-06 (×3): 6 via TOPICAL

## 2022-09-03 MED ORDER — POTASSIUM CHLORIDE 10 MEQ/50ML IV SOLN: 10.0000 meq | INTRAVENOUS | Status: DC

## 2022-09-03 MED ORDER — BUPRENORPHINE 5 MCG/HR TD PTWK: 1.0000 | MEDICATED_PATCH | TRANSDERMAL | Status: DC

## 2022-09-03 MED ORDER — POTASSIUM CHLORIDE 20 MEQ PO PACK
20.0000 meq | PACK | Freq: Two times a day (BID) | ORAL | Status: DC
  Administered 2022-09-03: 20 meq
  Filled 2022-09-03 (×3): qty 1

## 2022-09-03 MED ORDER — MENTHOL 3 MG MT LOZG: 1.0000 | LOZENGE | OROMUCOSAL | Status: DC | PRN

## 2022-09-03 MED ORDER — CYCLOBENZAPRINE HCL 5 MG PO TABS
5.0000 mg | ORAL_TABLET | Freq: Three times a day (TID) | ORAL | Status: DC
  Administered 2022-09-03 – 2022-09-04 (×2): 5 mg
  Filled 2022-09-03 (×3): qty 1

## 2022-09-03 MED ORDER — SODIUM CHLORIDE 1 G PO TABS: 1.0000 g | ORAL_TABLET | Freq: Two times a day (BID) | ORAL | Status: DC

## 2022-09-03 MED ORDER — FUROSEMIDE 10 MG/ML IJ SOLN
40.0000 mg | Freq: Once | INTRAMUSCULAR | Status: AC
  Administered 2022-09-03: 40 mg via INTRAVENOUS
  Filled 2022-09-03: qty 4

## 2022-09-03 MED ORDER — CALCIUM POLYCARBOPHIL 625 MG PO TABS
625.0000 mg | ORAL_TABLET | Freq: Every day | ORAL | Status: DC
  Administered 2022-09-03: 625 mg via NASOGASTRIC
  Filled 2022-09-03: qty 1

## 2022-09-03 MED ORDER — ADULT MULTIVITAMIN W/MINERALS CH
1.0000 | ORAL_TABLET | Freq: Every day | ORAL | Status: DC
  Administered 2022-09-04 – 2022-09-06 (×3): 1
  Filled 2022-09-03 (×3): qty 1

## 2022-09-03 MED ORDER — SODIUM CHLORIDE 0.9% FLUSH
10.0000 mL | INTRAVENOUS | Status: DC | PRN
  Administered 2022-09-24: 10 mL

## 2022-09-03 MED ORDER — IPRATROPIUM-ALBUTEROL 0.5-2.5 (3) MG/3ML IN SOLN: 3.0000 mL | Freq: Four times a day (QID) | RESPIRATORY_TRACT | Status: DC | PRN

## 2022-09-03 MED ORDER — TAMSULOSIN HCL 0.4 MG PO CAPS
0.4000 mg | ORAL_CAPSULE | Freq: Every day | ORAL | Status: DC
  Administered 2022-09-04 – 2022-10-05 (×32): 0.4 mg via ORAL
  Filled 2022-09-03 (×32): qty 1

## 2022-09-03 MED ORDER — CARVEDILOL 12.5 MG PO TABS: 12.5000 mg | ORAL_TABLET | Freq: Two times a day (BID) | ORAL | Status: DC

## 2022-09-03 MED ORDER — CARVEDILOL 12.5 MG PO TABS
12.5000 mg | ORAL_TABLET | Freq: Two times a day (BID) | ORAL | Status: DC
  Administered 2022-09-03 – 2022-09-06 (×6): 12.5 mg
  Filled 2022-09-03 (×7): qty 1

## 2022-09-03 MED ORDER — PROCHLORPERAZINE 25 MG RE SUPP: 12.5000 mg | Freq: Four times a day (QID) | RECTAL | Status: DC | PRN

## 2022-09-03 MED ORDER — LAMOTRIGINE 100 MG PO TABS
200.0000 mg | ORAL_TABLET | Freq: Every day | ORAL | Status: DC
  Administered 2022-09-04 – 2022-09-06 (×3): 200 mg
  Filled 2022-09-03 (×3): qty 2

## 2022-09-03 MED ORDER — LEVOTHYROXINE SODIUM 88 MCG PO TABS
88.0000 ug | ORAL_TABLET | Freq: Every day | ORAL | Status: DC
  Administered 2022-09-04 – 2022-09-06 (×3): 88 ug
  Filled 2022-09-03 (×3): qty 1

## 2022-09-03 MED ORDER — VANCOMYCIN HCL 1250 MG/250ML IV SOLN
1250.0000 mg | Freq: Two times a day (BID) | INTRAVENOUS | Status: DC
  Administered 2022-09-04 – 2022-09-10 (×14): 1250 mg via INTRAVENOUS
  Filled 2022-09-03 (×16): qty 250

## 2022-09-03 MED ORDER — SODIUM CHLORIDE 0.9 % IV SOLN: 2.0000 g | Freq: Three times a day (TID) | INTRAVENOUS | Status: DC

## 2022-09-03 MED ORDER — DIPHENOXYLATE-ATROPINE 2.5-0.025 MG PO TABS: 1.0000 | ORAL_TABLET | Freq: Four times a day (QID) | ORAL | Status: DC | PRN

## 2022-09-03 NOTE — H&P (Signed)
Physical Medicine and Rehabilitation Admission H&P    CC: Functional deficits due to lumbar infection with sepsis.  : HPI:  [name]. Rothfus is a 67 year old female with history of PVT, T2DM, small membranous VSD, depression, bipolar d/o, mild cognitive impairment,, fall 05/2022 with 3 column unstable fracture through prior scoliosis construct s/p instrumentation with exposed hardware and wound dehiscence. She was admitted on 08/09/2022 for revision of complex lumbar wound, removal of L1-L5 lumbar screws and rods with revision L1-L5 posterior fusion by Dr. [name].    Postop had issues with fatigue cough, hypotension as well as confusion overnight on 01/24. BLE Dopplers ordered for follow-up on 01/22 and was negative for DVT and showed hypoechoic collection in left popliteal fossa.  Foley placed due to ongoing issues with urinary retention.  Due to ongoing confusion and lethargy, antibiotics added due to concerns of UTI and oxycodone dose decreased due to concerns of side effects.  She started developing drainage from her wound on 01/28 as well as watery diarrhea and stool was negative for C. difficile.  She had rising white count to 19.9 with lactic acidosis on 01/31.  She was started on broad-spectrum antibiotics and continued to be febrile with Tmax 103.  ID consulted for input due to ongoing fevers and recommended changing antibiotics to vancomycin, cefepime and metronidazole as well as repeat CTA chest/CT spine.  Chest CT showed multifocal patchy or base opacities throughout RUL, RML and bilateral lower lobes worrisome for multifocal pneumonia as well as acute/subacute right 9-10 rib Fx and CT lumbar spine showed subacute fracture through L3 vertebral body and increased air-fluid and posterior soft tissues unexpected in setting of recent surgery.  CT head was negative for acute changes.    [name] consulted due to worsening of respiratory status due to sepsis from acute meningitis and asked  patient pneumonia and she required intubation on 02/01.  Neurology consulted due to concerns of seizures and EEG showed moderate diffuse encephalopathy.  MRI brain done showing punctate foci of acute ischemia right frontal lobe and diffusion abnormality within the occipital horn of left lateral ventricle with proteinaceous/purulent debris.  MRI spine showed L2-L5 fusion widely patent with decrease in bone marrow edema L3.  Dr. [name] recommended continuing pain and Flagyl for aspiration pneumonia as well as CNS penetration and given hardware exposure, she would require at least 6 weeks of treatment empirically to treat osteomyelitis.  Tracheal aspirate positive for Candida but not felt to be a pathogen in lung per ID.  Blood cultures x 2 negative.  Core track placed for nutritional support. She was briefly extubated on 02/06 but reintubated due to fatigue and increased WOB.  She underwent tracheostomy on 02/06, was extubated to ATC and is tolerating PMV.  Follow-up MRI brain 12/13 showed "dependent diffusion abnormality in occipital horn of left lateral ventricle with ependymal enhancement concerning for ventriculitis". MBS done on 02/12 and patient cleared for regular diet w/thins. She did have emesis with episode of desaturation and copious secretions on 02/13. She has had multiple voiding trials--last on 02/10 and foley was replaced. Wound VAC changed today and reports of fecal management system in place due to ongoing loose stool and MASD/prevent wound contamination. Trach to be downsized to CFS# 4 today.   PT/OT has been working with patient who is showing participation and progression with therapy. She is able to transfer on LLE with +2 max assist. She continues to be limited by weakness, cognitive deficits requiring increased time  to process as well as verbal and tactile cues to follow simple commands, displays poor attention and [name]. CIR recommended due to functional decline.    Review of Systems   Constitutional:  Negative for fever.  HENT:  Negative for hearing loss.   Eyes:  Negative for blurred vision.  Respiratory:  Negative for shortness of breath.   Gastrointestinal:  Positive for diarrhea. Negative for heartburn.  Genitourinary:  Negative for dysuria.  Musculoskeletal:  Positive for falls (multiple in the past.). Negative for back pain.  Neurological:  Positive for focal weakness (RLE shorter and tended to walk with it rotated outward) and weakness. Negative for dizziness.  Psychiatric/Behavioral:  Positive for [name] loss (greatly improved c/w past 3 days).     Past Medical History:  Diagnosis Date   Anemia    Anxiety    Arthritis    Depression    GERD (gastroesophageal reflux disease)    Heart murmur    "related to VSD"   High cholesterol    History of blood transfusion    "related to OR" (08/19/2016)   History of hiatal hernia    Hypertension    Hyperthyroidism    Mild cognitive impairment 09/06/2018   Paroxysmal ventricular tachycardia (HCC)    Type II diabetes mellitus ([place])    UTI (urinary tract infection)    being treated with Keflex   Ventricular septal defect     Past Surgical History:  Procedure Laterality Date   ABDOMINAL HYSTERECTOMY     BACK SURGERY     CARDIAC CATHETERIZATION N/A 06/23/2015   Procedure: Left Heart Cath and Coronary Angiography;  Surgeon: Larey Dresser, MD;  Location: [facility];  Service: Cardiovascular;  Laterality: N/A;   CARDIAC CATHETERIZATION  1960   "VSD was so small; didn't need repaired"   EXAM UNDER ANESTHESIA WITH MANIPULATION OF HIP Right 06/02/2014   dr Rhona Raider   FRACTURE SURGERY     HERNIA REPAIR     HIP CLOSED REDUCTION Right 06/02/2014   Procedure: CLOSED MANIPULATION HIP;  Surgeon: Hessie Dibble, MD;  Location: [facility];  Service: Orthopedics;  Laterality: Right;   JOINT REPLACEMENT     JOINT REPLACEMENT     POSTERIOR LUMBAR FUSION 4 LEVEL N/A 06/26/2022   Procedure: Lumbar One To Lumbar Five  Posterior Instrumented Fusion;  Surgeon: Judith Part, MD;  Location: [facility];  Service: Neurosurgery;  Laterality: N/A;   REFRACTIVE SURGERY Bilateral    SHOULDER ARTHROSCOPY Right    SHOULDER OPEN ROTATOR CUFF REPAIR Right    SPINAL FUSION  1996   "t10 down to my coccyx   SPINE HARDWARE REMOVAL     TOTAL ABDOMINAL HYSTERECTOMY     TOTAL HIP ARTHROPLASTY Right 05/10/2014   [facility]      by dr Harrell Gave olcott   TOTAL KNEE ARTHROPLASTY Left    TOTAL SHOULDER ARTHROPLASTY Left 08/19/2016   Procedure: TOTAL SHOULDER ARTHROPLASTY;  Surgeon: Tania Ade, MD;  Location: [facility];  Service: Orthopedics;  Laterality: Left;  Left total shoulder replacement    Family History  Problem Relation Age of Onset   Other Mother        alive   Stroke Father 91       deceased   Dementia Father    Chorea Maternal Grandfather    Dementia Maternal Aunt    Dementia Maternal Aunt    Heart attack Other        multiple uncles have died with myocardial infarction  Social History: Jeannie Done works part time. Family plans on hiring assistance after d/c. 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  reports that she has been smoking cigarettes. She has never used smokeless tobacco. She reports that she does not drink alcohol and does not use drugs.   Allergies: No Known Allergies   Medications Prior to Admission  Medication Sig Dispense Refill   acetaminophen (TYLENOL) 500 MG tablet Take 1,000 mg by mouth in the morning, at noon, and at bedtime. (0800, 1400 & 2000)     amLODipine (NORVASC) 5 MG tablet Take 5 mg by mouth in the morning. (0730)     carvedilol (COREG) 6.25 MG tablet Take 6.25 mg by mouth in the morning and at bedtime. (0730 & 2000)     cyclobenzaprine (FLEXERIL) 5 MG tablet Take 5 mg by mouth 3 (three) times daily. (0800, 1300 & 2000)     donepezil (ARICEPT) 10 MG tablet Take 10 mg by mouth daily.     fluvoxaMINE (LUVOX) 100 MG  tablet Take 100 mg by mouth at bedtime.     gabapentin (NEURONTIN) 100 MG capsule Take 200 mg by mouth in the morning, at noon, and at bedtime. (0800, 1400 & 2000)     lamoTRIgine (LAMICTAL) 200 MG tablet Take 200 mg by mouth daily.     levothyroxine (SYNTHROID) 88 MCG tablet Take 88 mcg by mouth daily before breakfast.     loperamide (IMODIUM) 2 MG capsule Take 2-4 mg by mouth See admin instructions. Take 1 capsule (2 mg) by mouth once daily scheduled & take 1-2 capsules (2-4 mg) by mouth once daily as needed for additional loose stools.     losartan (COZAAR) 100 MG tablet Take 100 mg by mouth at bedtime.     metFORMIN (GLUCOPHAGE) 500 MG tablet Take 500 mg by mouth in the morning and at bedtime.     Multiple Vitamin (MULTIVITAMIN) tablet Take 1 tablet by mouth daily.       ondansetron (ZOFRAN) 4 MG tablet Take 1 tablet (4 mg total) by mouth every 6 (six) hours as needed for nausea or vomiting. 20 tablet 0   OZEMPIC, 2 MG/DOSE, 8 MG/3ML SOPN Inject 2 mg into the skin every Monday.     pantoprazole (PROTONIX) 40 MG tablet Take 80 mg by mouth at bedtime.     QUEtiapine (SEROQUEL) 300 MG tablet Take 600 mg by mouth at bedtime.     Semaglutide, 1 MG/DOSE, (OZEMPIC, 1 MG/DOSE,) 4 MG/3ML SOPN Inject 2 mg into the skin every Monday.     sodium chloride 1 g tablet Take 1 g by mouth every other day. (0900)     traMADol (ULTRAM) 50 MG tablet Take 25 mg by mouth every 6 (six) hours as needed (PAIN).     traZODone (DESYREL) 100 MG tablet Take 100 mg by mouth at bedtime.     buPROPion (WELLBUTRIN XL) 300 MG 24 hr tablet Take 300 mg by mouth in the morning.  (0730)     nicotine polacrilex (NICORETTE) 2 MG gum Take 2 mg by mouth daily.        Home: Home Living Family/patient expects to be discharged to:: Skilled nursing facility Living Arrangements: Spouse/significant other Available Help at Discharge: Family, Available 24 hours/day (2 daughters, son , spouse and hired Programmer, multimedia for 24/7) Type of Home:  [place] Access: [place]: One level Bathroom Shower/Tub: Multimedia programmer: Handicapped height [place] Accessibility: Yes  Additional Comments: Was Mod I prior to admit in 12/23 with RW short distances and wheelchair long disctances  Lives With: Spouse   Functional History: Prior Function Prior Level of Function : Needs assist  Cognitive Assist : Mobility (cognitive), ADLs (cognitive) Mobility (Cognitive): Intermittent cues ADLs (Cognitive): Intermittent cues Physical Assist : Mobility (physical), ADLs (physical) Mobility (physical): Bed mobility, Transfers ADLs (physical): Grooming, Bathing, Dressing, IADLs, Toileting, Feeding Mobility Comments: to return home with family 24/7 min asisst ADLs Comments: needed assist with all BADL tasks  Functional Status:  Mobility: Bed Mobility Overal bed mobility: Needs Assistance Bed Mobility: Rolling, Sidelying to Sit Rolling: Mod assist, +2 for safety/equipment Sidelying to sit: +2 for physical assistance, Max assist Supine to sit: Max assist, +2 for physical assistance Sit to supine: +2 for physical assistance, Max assist Sit to sidelying: Max assist, +2 for safety/equipment General bed mobility comments: using rail and bit to help assist for legs off bed and to lift trunk Transfers Overall transfer level: Needs assistance Equipment used: Ambulation equipment used Transfers: Bed to chair/wheelchair/BSC Sit to Stand: Max assist, +2 physical assistance, +2 safety/equipment Bed to/from chair/wheelchair/BSC transfer type:: Squat pivot Stand pivot transfers: Max assist, +2 safety/equipment, +2 physical assistance Squat pivot transfers: +2 physical assistance, Max assist  Lateral/Scoot Transfers: Total assist, +2 physical assistance Transfer via Lift Equipment: [name] General transfer comment: up on L leg mainly for pivot to recliner with armrest lowered and using bed pad to pivot due to skin issued on her  back and gait belt not used      ADL: ADL Overall ADL's : Needs assistance/impaired Eating/Feeding: NPO Eating/Feeding Details (indicate cue type and reason): Declined proper set-up of bed for adequate positioning to eat. Only acceptable of the HOB at ~30-40 degrees. Grooming: Moderate assistance, Oral care Grooming Details (indicate cue type and reason): suction oral care with RUE, required setup and proximal support with cueing for task attention Upper Body Bathing: Total assistance, Bed level Lower Body Bathing: Total assistance, Bed level Upper Body Dressing : Total assistance, Bed level Lower Body Dressing: Total assistance, +2 for physical assistance, +2 for safety/equipment, Sit to/from stand Toilet Transfer: +2 for physical assistance, +2 for safety/equipment, Maximal assistance Toilet Transfer Details (indicate cue type and reason): using stedy Toileting- Clothing Manipulation and Hygiene: Bed level, Total assistance Functional mobility during ADLs: Maximal assistance, +2 for physical assistance, +2 for safety/equipment General ADL Comments: pt easily distracted, completed transfer so she could visit with her son  Cognition: Cognition Overall Cognitive Status: Impaired/Different from baseline Arousal/Alertness: Awake/alert Orientation Level: Oriented X4 (forgetful) Attention: Sustained, Selective Sustained Attention: Impaired Sustained Attention Impairment: Functional complex, Verbal basic Selective Attention: Impaired Selective Attention Impairment: Verbal complex, Functional complex [name]: Impaired [name] Impairment: Retrieval deficit Awareness: Appears intact Problem Solving: Appears intact Safety/Judgment: Appears intact Cognition Arousal/Alertness: Awake/alert Behavior During Therapy: Anxious Overall Cognitive Status: Impaired/Different from baseline Area of Impairment: Attention, [name], Following commands, Safety/judgement, Problem solving,  Awareness Orientation Level: Disoriented to (date, once re-oriented able to recall Super Bowl sunday) Current Attention Level: Sustained [name]: Decreased recall of precautions, Decreased short-term [name] Following Commands: Follows one step commands with increased time (and multimodal cues) Safety/Judgement: Decreased awareness of safety, Decreased awareness of deficits Awareness: Intellectual Problem Solving: Slow processing, Decreased initiation, Difficulty sequencing, Requires verbal cues, Requires tactile cues General Comments: frustrated and perseverating on the fact that she was trying to visit with her sister when PT arrived. Difficult to assess due to: Tracheostomy  Physical Exam: Blood pressure (!) 143/90,  pulse 84, temperature 98.8 F (37.1 C), temperature source Oral, resp. rate 14, height 5' (1.524 m), weight 67.6 kg, SpO2 98 %. Physical Exam Vitals and nursing note reviewed.  Constitutional:      Appearance: Normal appearance.  HENT:     Head: Normocephalic and atraumatic.     Right Ear: External ear normal.     Left Ear: External ear normal.     Nose:     Comments: NGT in place    Mouth/Throat:     Mouth: Mucous membranes are moist.     Pharynx: Oropharynx is clear.  Eyes:     Extraocular Movements: Extraocular movements intact.     Conjunctiva/sclera: Conjunctivae normal.     Pupils: Pupils are equal, round, and reactive to light.  Neck:     Comments: #6 trach in place. Pt able to speak around trach fairly well. Excellent phonation with PMV Cardiovascular:     Rate and Rhythm: Normal rate and regular rhythm.     Heart sounds: No murmur heard.    No gallop.  Pulmonary:     Effort: Pulmonary effort is normal. No respiratory distress.     Breath sounds: No wheezing.  Abdominal:     General: Bowel sounds are normal. There is no distension.     Palpations: Abdomen is soft.     Tenderness: There is no abdominal tenderness.  Genitourinary:    Comments: Rectal  tube and foley in place.  Musculoskeletal:        General: Swelling present.     Cervical back: Normal range of motion.     Comments: Right leg discrepancy with weakness and leg rotated outward. Dependent edema Right forearm and hand and left forearm.   Skin:    Comments: Well healed old right thigh incision. Wound VAC in place, low back  Neurological:     Mental Status: She is alert and oriented to person, place, and time.     Comments: Alert and oriented x 3 (needed some help with day) much improved insight and awareness. [name]. Normal language and speech. Cranial nerve exam unremarkable. UE 4/5 prox to distal. RLE 2-/5 prox to 4/5 distally. LLE 2+/5 prox to 4/5 distally. Senses pain in all 4 limbs. No abnl resting tone.   Psychiatric:        Mood and Affect: Mood normal.        Behavior: Behavior normal.     Results for orders placed or performed during the hospital encounter of 08/09/22 (from the past 48 hour(s))  Glucose, capillary     Status: Abnormal   Collection Time: 09/01/22  3:17 PM  Result Value Ref Range   Glucose-Capillary 135 (H) 70 - 99 mg/dL    Comment: Glucose reference range applies only to samples taken after fasting for at least 8 hours.  Glucose, capillary     Status: Abnormal   Collection Time: 09/02/22  5:23 AM  Result Value Ref Range   Glucose-Capillary 131 (H) 70 - 99 mg/dL    Comment: Glucose reference range applies only to samples taken after fasting for at least 8 hours.  CBC     Status: Abnormal   Collection Time: 09/02/22 10:30 AM  Result Value Ref Range   WBC 8.9 4.0 - 10.5 K/uL   RBC 2.78 (L) 3.87 - 5.11 MIL/uL   Hemoglobin 9.3 (L) 12.0 - 15.0 g/dL   HCT 28.1 (L) 36.0 - 46.0 %   MCV 101.1 (H) 80.0 - 100.0 fL  MCH 33.5 26.0 - 34.0 pg   MCHC 33.1 30.0 - 36.0 g/dL   RDW 20.1 (H) 11.5 - 15.5 %   Platelets 441 (H) 150 - 400 K/uL   nRBC 0.0 0.0 - 0.2 %    Comment: Performed at [facility] [address], [place], [place] [address]   Comprehensive metabolic panel     Status: Abnormal   Collection Time: 09/02/22 10:30 AM  Result Value Ref Range   Sodium 130 (L) 135 - 145 mmol/L   Potassium 3.4 (L) 3.5 - 5.1 mmol/L   Chloride 93 (L) 98 - 111 mmol/L   CO2 28 22 - 32 mmol/L   Glucose, Bld 109 (H) 70 - 99 mg/dL    Comment: Glucose reference range applies only to samples taken after fasting for at least 8 hours.   BUN 7 (L) 8 - 23 mg/dL   Creatinine, Ser 0.34 (L) 0.44 - 1.00 mg/dL   Calcium 8.3 (L) 8.9 - 10.3 mg/dL   Total Protein 5.2 (L) 6.5 - 8.1 g/dL   Albumin 2.1 (L) 3.5 - 5.0 g/dL   AST 22 15 - 41 U/L   ALT 12 0 - 44 U/L   Alkaline Phosphatase 61 38 - 126 U/L   Total Bilirubin 0.5 0.3 - 1.2 mg/dL   GFR, Estimated >60 >60 mL/min    Comment: (NOTE) Calculated using the CKD-EPI Creatinine Equation (2021)    Anion gap 9 5 - 15    Comment: Performed at [facility], [place] [address] [place], [place] [address]  Magnesium     Status: None   Collection Time: 09/02/22 10:30 AM  Result Value Ref Range   Magnesium 1.7 1.7 - 2.4 mg/dL    Comment: Performed at [facility] [address], [place], [place] [address]  Glucose, capillary     Status: Abnormal   Collection Time: 09/02/22  7:52 PM  Result Value Ref Range   Glucose-Capillary 112 (H) 70 - 99 mg/dL    Comment: Glucose reference range applies only to samples taken after fasting for at least 8 hours.  Glucose, capillary     Status: Abnormal   Collection Time: 09/02/22 11:32 PM  Result Value Ref Range   Glucose-Capillary 113 (H) 70 - 99 mg/dL    Comment: Glucose reference range applies only to samples taken after fasting for at least 8 hours.  Vitamin B12     Status: None   Collection Time: 09/03/22  4:05 AM  Result Value Ref Range   Vitamin B-12 498 180 - 914 pg/mL    Comment: (NOTE) This assay is not validated for testing neonatal or myeloproliferative syndrome specimens for Vitamin B12 levels. Performed at [facility], [place] [address] [place], [place] [address]   Folate     Status: None   Collection Time: 09/03/22  4:05 AM  Result Value Ref Range   Folate 12.3 >5.9 ng/mL    Comment: Performed at [facility] [address] [place], [place] [address]  Iron and TIBC     Status: Abnormal   Collection Time: 09/03/22  4:05 AM  Result Value Ref Range   Iron 57 28 - 170 ug/dL   TIBC 162 (L) 250 - 450 ug/dL   Saturation Ratios 35 (H) 10.4 - 31.8 %   UIBC 105 ug/dL    Comment: Performed at [facility], [place] [address], [place], [place] [address]  Ferritin  Status: None   Collection Time: 09/03/22  4:05 AM  Result Value Ref Range   Ferritin 80 11 - 307 ng/mL    Comment: Performed at [facility], [place] [address], [place], [place] [address]  Reticulocytes     Status: Abnormal   Collection Time: 09/03/22  4:05 AM  Result Value Ref Range   Retic Ct Pct 6.7 (H) 0.4 - 3.1 %   RBC. 2.57 (L) 3.87 - 5.11 MIL/uL   Retic Count, Absolute 173.2 19.0 - 186.0 K/uL   Immature Retic Fract 26.7 (H) 2.3 - 15.9 %    Comment: Performed at [facility] [address] [place], [place] [address]  Comprehensive metabolic panel     Status: Abnormal   Collection Time: 09/03/22  4:05 AM  Result Value Ref Range   Sodium 130 (L) 135 - 145 mmol/L   Potassium 3.2 (L) 3.5 - 5.1 mmol/L   Chloride 94 (L) 98 - 111 mmol/L   CO2 26 22 - 32 mmol/L   Glucose, Bld 137 (H) 70 - 99 mg/dL    Comment: Glucose reference range applies only to samples taken after fasting for at least 8 hours.   BUN 7 (L) 8 - 23 mg/dL   Creatinine, Ser <0.30 (L) 0.44 - 1.00 mg/dL   Calcium 7.9 (L) 8.9 - 10.3 mg/dL   Total Protein 4.8 (L) 6.5 - 8.1 g/dL   Albumin 1.9 (L) 3.5 - 5.0 g/dL   AST 19 15 - 41 U/L   ALT 10 0 - 44 U/L   Alkaline Phosphatase 60 38 - 126 U/L   Total Bilirubin 0.2 (L) 0.3 - 1.2 mg/dL   GFR, Estimated NOT CALCULATED >60 mL/min    Comment: (NOTE) Calculated using the CKD-EPI Creatinine Equation (2021)    Anion gap 10 5 -  15    Comment: Performed at [facility] [address], [place], [place] [address]  CBC     Status: Abnormal   Collection Time: 09/03/22  4:05 AM  Result Value Ref Range   WBC 7.8 4.0 - 10.5 K/uL   RBC 2.52 (L) 3.87 - 5.11 MIL/uL   Hemoglobin 8.4 (L) 12.0 - 15.0 g/dL   HCT 25.3 (L) 36.0 - 46.0 %   MCV 100.4 (H) 80.0 - 100.0 fL   MCH 33.3 26.0 - 34.0 pg   MCHC 33.2 30.0 - 36.0 g/dL   RDW 20.4 (H) 11.5 - 15.5 %   Platelets 403 (H) 150 - 400 K/uL   nRBC 0.0 0.0 - 0.2 %    Comment: Performed at [facility] [address], [place], [place] [address]  Magnesium     Status: None   Collection Time: 09/03/22  4:05 AM  Result Value Ref Range   Magnesium 1.7 1.7 - 2.4 mg/dL    Comment: Performed at [facility] [address], [place], [place] [address]  Glucose, capillary     Status: Abnormal   Collection Time: 09/03/22  7:43 AM  Result Value Ref Range   Glucose-Capillary 129 (H) 70 - 99 mg/dL    Comment: Glucose reference range applies only to samples taken after fasting for at least 8 hours.   No results found.    Blood pressure (!) 143/90, pulse 84, temperature 98.8 F (37.1 C), temperature source Oral, resp. rate 14, height 5' (1.524 m), weight 67.6 kg, SpO2 98 %.  Medical Problem List and Plan: 1. Functional deficits secondary  to osteomyelitis,infection of lumbar surgical site complicated by meningitis/ventriculitis.  -pt also with significant deconditioning  -patient may not yet shower  -ELOS/Goals: 20-27 days, supervision to min assist with PT, OT, SLP 2.  Antithrombotics: -DVT/anticoagulation:  Pharmaceutical: Lovenox daily  -antiplatelet therapy: N/a 3. Pain Management: [facility] for pain control w/ Flexeril TID 4. Mood/Behavior/Sleep: LCSW to follow for evaluation and support.  --Trazodone at bedtime for  insomnia/mood   -antipsychotic agents:  Luvox and Lamictal 5. Neuropsych/cognition: This patient is not fully capable of making decisions on her  own behalf. 6. Skin/Wound Care: WOC to follow for wound VAC changes  --MASD managed by fecal system.  7. Fluids/Electrolytes/Nutrition: Monitor I/O. Continue tube feeds-->likely contributing to diarrhea  -pt had unrecorded intake today  -both she and daughters would like to transition from NGT  -will cut TF in half tonight  -monitor intake closely  --Check CMET in am 8.Sepsis w/ osteomyelitis/meningitis/encephalitis: Continue Cefepime, Vanc and Flagyl for at least 6 weeks  --end date per ID. Will follow up on plan during admit  9. H/o PVT:  Monitor BP/heart rate TID--on coreg and  10. Urinary retention: Has failed 4 voiding trails. Has had foley since 12/23  -maintain foley cath, foley mgt per protocol -- Continue Flomax. Will d/c urecholine.  11. VDRF s/p trach: Tolerating PMSV well -I ordered downsizing to CFS # 4 today--tolerated well -continue ATC w/ pulmonary hygiene -begin day time plugging of trach tomorrow. Decannulate soon. 12. T2DM: Monitor BS ac/hs and use SSI for elevated BS.   --continue tube feeds with supplements between meals due to poor intake  --continue liberalized diet for now.  13. Hypokalemia: Recurrent likely due to diarrhea/intake.   --will add standing dose BID -->recheck labs on 02/17 and 02/19 14. Anemia of critical illness: 15. Hyponatremia/hypochloremia: Improving 125/87-->130/94.  --Monitor for now. Recheck CMET in am.  16. Encephalopathy/Delirium: Continues to have intermittent confusion--but much improved compared to 3 days ago per family.  --Continue delirium precautions. 17. Loose stools likely d/t TF and abx.  -reducing TF as above.   -add probiotics and fiber   Bary Leriche, PA-C 09/03/2022

## 2022-09-03 NOTE — Progress Notes (Signed)
[name], MD  Physician Physical Medicine and Rehabilitation   PMR Pre-admission    Signed   Date of Service: 09/02/2022  4:29 PM  Related encounter: Admission (Current) from 08/09/2022 in [place]      Show:Clear all [x]$ Written[x]$ Templated[x]$ Copied  Added by: [x]$ Cristina Gong, RN  []$ Hover for details MR Admission Coordinator Pre-Admission Assessment   Patient: [name] is an 67 y.o., female MRN: [id number] DOB: July 25, 1955 Height: 5' (152.4 cm) Weight:  (Bedscale not working)                                                                                                                                                  Forensic scientist HMO:     PPO:      PCP:      IPA:      80/20:      OTHER:  PRIMARYOren Binet Medicare      Policy#: [id number]      Subscriber: pt CM Name: Larene Beach      Phone#: [phone]     Fax#: [phone] Pre-Cert#: [id number]   approved for 7 days   Employer:  Benefits:  Phone #: [phone]     Name: 2/15 Eff. Date: 07/19/22     Deduct: none      Out of Pocket Max: $3150      Life Max: none  CIR: $335 co pay per day days 1 until 5      SNF: no co pay per day days 1 until 20; $203 co pay per day days 21 until 60; no co pay per day days 61 until 100 Outpatient: $10 per visit     Co-Pay: visits per medical necessity Home Health: 100%      Co-Pay: none DME: 80%     Co-Pay: 20% Providers: in network  SECONDARY: none   Financial Counselor:       Phone#:    [employer] Engineer, [employer]" for patients in Inpatient Rehabilitation Facilities with attached "Privacy Act [place] Records" was provided and verbally reviewed with: Patient and Family   Emergency Contact Information Contact Information       Name Relation Home Work Mobile    Brahm,Cooper Spouse [phone] [place] Daughter     [phone]    [name] Daughter      [phone]         Current Medical History  Patient Admitting Diagnosis: Revision Lumbar spine wound   History of Present Illness:  67 y.o. female with a history of L1 L5 PLIF on 06/26/22 for unstable fracture through prior lumbar fusion who was readmitted on 07/08/22 for wound dehiscence. She was discharged to SNF after both admissions. [name]  was admitted again on August 09, 2022 for with wound dehiscence and  exposed hardware with unstable lumbar spine fracture at the surgical site.    The patient underwent revision of a complex lumbar wound with removal of left L1, L2, L3, L4, and L5 lumbar screws and rod, removal of right L1 screw, revision of L1-L5 posterior instrumented fusion by Dr. Marcello Moores O's regard.  Patient was noted to be lethargic postoperatively and was diagnosed with a UTI treated with cefadrox.  Pain medication was decreased also.  Patient was also hyponatremic.  Patient was seen by [facility] nurse for placement of VAC on 08/18/2022.  The same day patient was found to be tachypneic and there was concern for early sepsis and pulmonary edema.  Hospitalist were consulted to follow patient.  Abx were started empirically for sepsis.  No obvious source of infection was identified and infectious disease was consulted.  Critical care was also consulted for increased confusion and worsening shortness of breath.  CTA of the chest demonstrated multifocal infiltrates concerning for pneumonia due to aspiration.  Patient was ultimately intubated.  CT of the lumbar spine was unremarkable for acute changes.  MRI of the brain showed a punctate focus of ischemia in the right frontal lobe as well as some changes superior horn of the left lateral ventricle concerning for proteinaceous, purulent debris.  Flagyl was added for aspiration pneumonia as well as CNS coverage.  Blood cultures were negative.  Extubation was attempted on 08/23/2022 but patient failed and had to be reintubated and tracheostomy was placed  the same day.  Patient is currently on vancomycin, cefepime, and Flagyl with the plan of 6 weeks of total antibiotic coverage.     Patient's medical record from [facility] has been reviewed by the rehabilitation admission coordinator and physician.   Past Medical History      Past Medical History:  Diagnosis Date   Anemia     Anxiety     Arthritis     Depression     GERD (gastroesophageal reflux disease)     Heart murmur      "related to VSD"   High cholesterol     History of blood transfusion      "related to OR" (08/19/2016)   History of hiatal hernia     Hypertension     Hyperthyroidism     Mild cognitive impairment 09/06/2018   Paroxysmal ventricular tachycardia (HCC)     Type II diabetes mellitus (HCC)     UTI (urinary tract infection)      being treated with Keflex   Ventricular septal defect      Has the patient had major surgery during 100 days prior to admission? Yes   Family History  family history includes Chorea in her maternal grandfather; Dementia in her father, maternal aunt, and maternal aunt; Heart attack in an other family member; Other in her mother; Stroke (age of onset: 58) in her father.   Current Medications    Current Facility-Administered Medications:    0.9 %  sodium chloride infusion, , Intravenous, PRN, Chesley Mires, MD, Stopped at 08/29/22 2159   acetaminophen (TYLENOL) tablet 650 mg, 650 mg, Per Tube, Q6H PRN, 650 mg at 09/02/22 0551 **OR** acetaminophen (TYLENOL) suppository 650 mg, 650 mg, Rectal, Q6H PRN, Judith Part, MD   bethanechol (URECHOLINE) tablet 10 mg, 10 mg, Per Tube, TID, Jacky Kindle, MD, 10 mg at 09/03/22 0840   buprenorphine (BUTRANS) 5 MCG/HR 1 patch, 1 patch, Transdermal, Weekly, Judith Part, MD, 1 patch at 08/31/22 2104   carvedilol (  COREG) tablet 12.5 mg, 12.5 mg, Per Tube, BID WC, Chand, Sudham, MD, 12.5 mg at 09/03/22 0631   ceFEPIme (MAXIPIME) 2 g in sodium chloride 0.9 % 100 mL IVPB, 2 g,  Intravenous, Q8H, Paytes, Austin A, RPH, Stopped at 09/03/22 [id number]   Chlorhexidine Gluconate Cloth 2 % PADS 6 each, 6 each, Topical, Daily, Oswald Hillock, MD, 6 each at 09/03/22 0841   cyclobenzaprine (FLEXERIL) tablet 5 mg, 5 mg, Per Tube, TID, Jacky Kindle, MD, 5 mg at 09/03/22 0840   diphenoxylate-atropine (LOMOTIL) 2.5-0.025 MG per tablet 1-2 tablet, 1-2 tablet, Oral, QID PRN, Oswald Hillock, MD, 2 tablet at 08/15/22 1756   donepezil (ARICEPT) tablet 10 mg, 10 mg, Per Tube, Daily, Ventura Sellers, RPH, 10 mg at 09/03/22 0840   famotidine (PEPCID) tablet 20 mg, 20 mg, Per Tube, BID, Bowser, Laurel Dimmer, NP, 20 mg at 09/03/22 0840   feeding supplement (ENSURE ENLIVE / ENSURE PLUS) liquid 237 mL, 237 mL, Oral, TID BM, Judith Part, MD, 237 mL at 09/03/22 0842   feeding supplement (GLUCERNA 1.5 CAL) liquid 1,000 mL, 1,000 mL, Per Tube, Q24H, Caren Griffins, MD, Infusion Verify at 09/03/22 1054   fentaNYL (SUBLIMAZE) injection 25-50 mcg, 25-50 mcg, Intravenous, Q6H PRN, Judith Part, MD, 50 mcg at 09/03/22 0810   fluvoxaMINE (LUVOX) tablet 100 mg, 100 mg, Per Tube, QHS, Chand, Sudham, MD, 100 mg at 09/02/22 2150   gabapentin (NEURONTIN) 250 MG/5ML solution 100 mg, 100 mg, Per Tube, Q8H, Chand, Sudham, MD, 100 mg at 09/03/22 0631   [name]'s butt cream, , Topical, Daily, Jacky Kindle, MD, Given at 09/03/22 0900   heparin injection 5,000 Units, 5,000 Units, Subcutaneous, Q8H, Chand, Sudham, MD, 5,000 Units at 09/03/22 0631   insulin aspart (novoLOG) injection 0-15 Units, 0-15 Units, Subcutaneous, Q4H, Chand, Sudham, MD, 2 Units at 09/03/22 0842   ipratropium-albuterol (DUONEB) 0.5-2.5 (3) MG/3ML nebulizer solution 3 mL, 3 mL, Nebulization, Q6H PRN, Judith Part, MD   labetalol (NORMODYNE) injection 10 mg, 10 mg, Intravenous, Q2H PRN, Jacky Kindle, MD, 10 mg at 08/30/22 1804   lamoTRIgine (LAMICTAL) tablet 200 mg, 200 mg, Per Tube, Daily, Ventura Sellers, RPH, 200 mg at 09/03/22  0840   levothyroxine (SYNTHROID) tablet 88 mcg, 88 mcg, Per Tube, QAC breakfast, Ventura Sellers, RPH, 88 mcg at 09/03/22 0631   melatonin tablet 5 mg, 5 mg, Per Tube, QHS PRN, Kamineni, Neelima, MD   menthol-cetylpyridinium (CEPACOL) lozenge 3 mg, 1 lozenge, Oral, PRN **OR** [DISCONTINUED] phenol (CHLORASEPTIC) mouth spray 1 spray, 1 spray, Mouth/Throat, PRN, Darrick Meigs, Marge Duncans, MD   metroNIDAZOLE (FLAGYL) IVPB 500 mg, 500 mg, Intravenous, Q8H, Ostergard, Joyice Faster, MD, Last Rate: 100 mL/hr at 09/03/22 1054, Infusion Verify at 09/03/22 1054   multivitamin with minerals tablet 1 tablet, 1 tablet, Per Tube, Daily, Ventura Sellers, RPH, 1 tablet at 09/03/22 0840   ondansetron (ZOFRAN) tablet 4 mg, 4 mg, Oral, Q6H PRN **OR** ondansetron (ZOFRAN) injection 4 mg, 4 mg, Intravenous, Q6H PRN, Darrick Meigs, Gagan S, MD, 4 mg at 09/03/22 [id number]   Oral care mouth rinse, 15 mL, Mouth Rinse, PRN, Bowser, Laurel Dimmer, NP   Oral care mouth rinse, 15 mL, Mouth Rinse, 4 times per day, Collene Schlichter, PA-C, 15 mL at 09/03/22 1145   Oral care mouth rinse, 15 mL, Mouth Rinse, PRN, Cosentino, Allison R, PA-C   oxyCODONE (Oxy IR/ROXICODONE) immediate release tablet 7.5 mg, 7.5 mg, Per Tube, Q4H PRN, Jacky Kindle, MD, 7.5  mg at 09/03/22 0203   sodium chloride flush (NS) 0.9 % injection 10-40 mL, 10-40 mL, Intracatheter, Q12H, Ostergard, Joyice Faster, MD, 10 mL at 09/03/22 0843   sodium chloride flush (NS) 0.9 % injection 10-40 mL, 10-40 mL, Intracatheter, PRN, Judith Part, MD   sodium chloride flush (NS) 0.9 % injection 3 mL, 3 mL, Intravenous, Q12H, Darrick Meigs, Gagan S, MD, 3 mL at 09/01/22 1200   sodium chloride flush (NS) 0.9 % injection 3 mL, 3 mL, Intravenous, PRN, Darrick Meigs, Marge Duncans, MD   sodium chloride tablet 1 g, 1 g, Per Tube, BID WC, Ventura Sellers, RPH, 1 g at 09/03/22 0840   tamsulosin (FLOMAX) capsule 0.4 mg, 0.4 mg, Oral, QPC supper, Gherghe, Costin M, MD   traZODone (DESYREL) tablet 100 mg, 100 mg, Per Tube, QHS,  Chand, Sudham, MD, 100 mg at 09/02/22 2004   vancomycin (VANCOREADY) IVPB 1250 mg/250 mL, 1,250 mg, Intravenous, Q12H, Ostergard, Joyice Faster, MD, Last Rate: 166.7 mL/hr at 09/03/22 1144, 1,250 mg at 09/03/22 1144   Patients Current Diet:  Diet Order                  Diet - low sodium heart healthy             Diet regular Fluid consistency: Thin  Diet effective now                       Precautions / Restrictions Precautions Precautions: Fall, Back Precaution Booklet Issued: Yes (comment) Precaution Comments: trach, cortrack, flexiseal, wound vac on back, using PMSV Other Brace: prevlon boots Restrictions Weight Bearing Restrictions: No Other Position/Activity Restrictions: external rotation R hip at baseline, pt ambulated household distances PTA, w/c for community    Has the patient had 2 or more falls or a fall with injury in the past year?No   Prior Activity Level Limited Community (1-2x/wk): Mod I with RW in home, used wheelchair for long disctances, drove self   Prior Functional Level Prior Function Prior Level of Function : Needs assist  Cognitive Assist : Mobility (cognitive), ADLs (cognitive) Mobility (Cognitive): Intermittent cues ADLs (Cognitive): Intermittent cues Physical Assist : Mobility (physical), ADLs (physical) Mobility (physical): Bed mobility, Transfers ADLs (physical): Grooming, Bathing, Dressing, IADLs, Toileting, Feeding Mobility Comments: to return home with family 24/7 min asisst ADLs Comments: needed assist with all BADL tasks   Self Care: Did the patient need help bathing, dressing, using the toilet or eating?  Needed some help   Indoor Mobility: Did the patient need assistance with walking from room to room (with or without device)? Independent   Stairs: Did the patient need assistance with internal or external stairs (with or without device)? Independent   Functional Cognition: Did the patient need help planning regular tasks such as  shopping or remembering to take medications? Independent   Patient Information Are you of Hispanic, Latino/a,or Spanish origin?: A. No, not of Hispanic, Latino/a, or Spanish origin What is your race?: A. White Do you need or want an interpreter to communicate with a doctor or health care staff?: 0. No   Patient's Response To:  Health Literacy and Transportation Is the patient able to respond to health literacy and transportation needs?: Yes Health Literacy - How often do you need to have someone help you when you read instructions, pamphlets, or other written material from your doctor or pharmacy?: Never In the past 12 months, has lack of transportation kept you from medical appointments or from  getting medications?: No In the past 12 months, has lack of transportation kept you from meetings, work, or from getting things needed for daily living?: No   Development worker, international aid / [place] Devices/Equipment: Wheelchair, Shower chair with back   Prior Device Use: Indicate devices/aids used by the patient prior to current illness, exacerbation or injury? Walker short distances, wheelchair in the community   Current Functional Level Cognition   Arousal/Alertness: Awake/alert Overall Cognitive Status: Impaired/Different from baseline Difficult to assess due to: Tracheostomy Current Attention Level: Sustained Orientation Level: Oriented X4 (forgetful) Following Commands: Follows one step commands with increased time (and multimodal cues) Safety/Judgement: Decreased awareness of safety, Decreased awareness of deficits General Comments: frustrated and perseverating on the fact that she was trying to visit with her sister when PT arrived. Attention: Sustained, Selective Sustained Attention: Impaired Sustained Attention Impairment: Functional complex, Verbal basic Selective Attention: Impaired Selective Attention Impairment: Verbal complex, Functional complex Memory:  Impaired Memory Impairment: Retrieval deficit Awareness: Appears intact Problem Solving: Appears intact Safety/Judgment: Appears intact    Extremity Assessment (includes Sensation/Coordination)   Upper Extremity Assessment: RUE deficits/detail, LUE deficits/detail RUE Deficits / Details: improved AROM today but limited by weakness, AAROM for increased range and functional use required RUE Coordination: decreased gross motor, decreased fine motor LUE Deficits / Details: mild edema noted, improved AROM today but limited by weakness, AAROM for increased range and functional use required LUE Coordination: decreased fine motor, decreased gross motor  Lower Extremity Assessment: Defer to PT evaluation RLE Deficits / Details: Overall weakness, difficulty performing LAQ in seated position against gravity, grossly ~2/5 knee ex/knee flexion. LLE Deficits / Details: Overall weakness, difficulty performing LAQ in seated position against gravity, able to activtae quads minimally.     ADLs   Overall ADL's : Needs assistance/impaired Eating/Feeding: NPO Eating/Feeding Details (indicate cue type and reason): Declined proper set-up of bed for adequate positioning to eat. Only acceptable of the HOB at ~30-40 degrees. Grooming: Moderate assistance, Oral care Grooming Details (indicate cue type and reason): suction oral care with RUE, required setup and proximal support with cueing for task attention Upper Body Bathing: Total assistance, Bed level Lower Body Bathing: Total assistance, Bed level Upper Body Dressing : Total assistance, Bed level Lower Body Dressing: Total assistance, +2 for physical assistance, +2 for safety/equipment, Sit to/from stand Toilet Transfer: +2 for physical assistance, +2 for safety/equipment, Maximal assistance Toilet Transfer Details (indicate cue type and reason): using stedy Toileting- Clothing Manipulation and Hygiene: Bed level, Total assistance Functional mobility during  ADLs: Maximal assistance, +2 for physical assistance, +2 for safety/equipment General ADL Comments: pt easily distracted, completed transfer so she could visit with her son     Mobility   Overal bed mobility: Needs Assistance Bed Mobility: Rolling, Sidelying to Sit Rolling: Mod assist, +2 for safety/equipment Sidelying to sit: +2 for physical assistance, Max assist Supine to sit: Max assist, +2 for physical assistance Sit to supine: +2 for physical assistance, Max assist Sit to sidelying: Max assist, +2 for safety/equipment General bed mobility comments: using rail and bit to help assist for legs off bed and to lift trunk     Transfers   Overall transfer level: Needs assistance Equipment used: Ambulation equipment used Transfers: Bed to chair/wheelchair/BSC Sit to Stand: Max assist, +2 physical assistance, +2 safety/equipment Bed to/from chair/wheelchair/BSC transfer type:: Squat pivot Stand pivot transfers: Max assist, +2 safety/equipment, +2 physical assistance Squat pivot transfers: +2 physical assistance, Max assist  Lateral/Scoot Transfers: Total assist, +2 physical  assistance Transfer via Lift Equipment: [employer] transfer comment: up on L leg mainly for pivot to recliner with armrest lowered and using bed pad to pivot due to skin issued on her back and gait belt not used     Ambulation / Gait / Stairs / Proofreader / Balance Dynamic Sitting Balance Sitting balance - Comments: sitting with S about 20 seconds then assisted to prevent L lateral LOB Balance Overall balance assessment: Needs assistance Sitting-balance support: Feet supported Sitting balance-Leahy Scale: Poor Sitting balance - Comments: sitting with S about 20 seconds then assisted to prevent L lateral LOB Postural control: Left lateral lean Standing balance support: Bilateral upper extremity supported Standing balance-Leahy Scale: Zero Standing balance comment: NT     Special  needs/care consideration 2/6 # 6 cuffed trach 28 % trach collar 2/2 10 fr cortrak feeding tube left nare  2/10 14 fr urethral catheter WOC Nurse wound follow up 2/16 Wound type:lumbar wound, surgical dehiscence Measurement:not obtained today Wound bed: granulating, edges contracting well  Photo in chart.  Improved. [place] remains at proximal and distal end.  Drainage (amount, consistency, odor) minimal seroaanguinous  no odor Periwound: intact  continues with loose stool, fecal management system in place and no leakage noted today.  Gerhardts to buttocks and incontinence associated skin breakdown Dressing procedure/placement/frequency:barrier ring to proximal and distal end to promote seal.  1 piece black foam. Covered with drape.  Change Mon.WEd.Fri.  Patient is pleasant and talkative this AM.  Family at bedside.  They observe the wound and are pleased with progress. Will follow.  Estrellita Ludwig MSN, RN, FNP-BC CWON Wound, Ostomy, Continence Nurse [facility] [phone] Pager [phone]     Previous Home Environment  Living Arrangements: Spouse/significant other  Lives With: Spouse Available Help at Discharge: Family, Available 24 hours/day (2 daughters, son , spouse and hired Programmer, multimedia for 24/7) Type of Home: [place] Name: [place]: One level Home Access: Ramped entrance Bathroom Shower/Tub: Multimedia programmer: Handicapped height [place] Accessibility: Yes How Accessible: Accessible via [place]: No Additional Comments: Was Mod I prior to admit in 12/23 with RW short distances and wheelchair long disctances   Discharge Living Setting Plans for Discharge Living Setting: Patient's home, Lives with (comment) (spouse) Type of Home at Discharge: House Discharge Home Layout: One level Discharge Home Access: [place] entrance Discharge Bathroom Shower/Tub: Walk-in shower Discharge Bathroom Toilet: Handicapped  height Discharge Bathroom Accessibility: Yes How Accessible: Accessible via walker Does the patient have any problems obtaining your medications?: No   Social/Family/Support Systems Patient Roles: Spouse, Parent Contact Information: spouse, duaghters and son Anticipated Caregiver: spouse, 2 daughters, son and hired caregivers Anticipated Ambulance person Information: see contacts Ability/Limitations of Caregiver: family work, but will arrange schedule and hire assist Caregiver Availability: 24/7 Discharge Plan Discussed with Primary Caregiver: Yes Is Caregiver In Agreement with Plan?: Yes Does Caregiver/Family have Issues with Lodging/Transportation while Pt is in Rehab?: No   Goals Patient/Family Goal for Rehab: supervision to min with PT and OT, mod I to supervision with SLP Expected length of stay: ELOS 20 to 27 days Pt/Family Agrees to Admission and willing to participate: Yes Program Orientation Provided & Reviewed with Pt/Caregiver Including Roles  & Responsibilities: Yes   Has [employer] ticket for June in [place] and her goal is  to be ambulatory and recovered to go.    Decrease burden of Care through IP rehab  admission: n/a   Possible need for SNF placement upon discharge:was at [facility] after last 2 admits since 12/23 and family feel that she did not have enuugh medical and nursing oversight of her complex needs.   Patient Condition: This patient's medical and functional status has changed since the consult dated: 08/31/22 in which the Rehabilitation Physician determined and documented that the patient's condition is appropriate for intensive rehabilitative care in an inpatient rehabilitation facility. See "History of Present Illness" (above) for medical update. Functional changes are: max assist overall. Patient's medical and functional status update has been discussed with the Rehabilitation physician and patient remains appropriate for inpatient rehabilitation. Will  admit to inpatient rehab today.   Preadmission Screen Completed By:  Cleatrice Burke, RN, 09/03/2022 1:58 PM ______________________________________________________________________   Discussed status with Dr. Naaman Plummer on 09/03/22 at 1400 and received approval for admission today.   Admission Coordinator:  Cleatrice Burke, time 1400 Date 09/03/22            Revision History

## 2022-09-03 NOTE — Progress Notes (Signed)
RT called to patients room for patient trach possibly dislodged. Patient trach was not in place. RT re inserted patients trach with positive ETCO2 indicated. No resistance met and RT able to pass suction catheter. Patients vital signs stable through out. Bedside RN will order for cxr to confirm placement.

## 2022-09-03 NOTE — H&P (Signed)
Physical Medicine and Rehabilitation Admission H&P     CC: Functional deficits due to lumbar infection with sepsis.   : HPI:  [name]. Bridson is a 67 year old female with history of PVT, T2DM, small membranous VSD, depression, bipolar d/o, mild cognitive impairment,, fall 05/2022 with 3 column unstable fracture through prior scoliosis construct s/p instrumentation with exposed hardware and wound dehiscence. She was admitted on 08/09/2022 for revision of complex lumbar wound, removal of L1-L5 lumbar screws and rods with revision L1-L5 posterior fusion by Dr. [name].    Postop had issues with fatigue cough, hypotension as well as confusion overnight on 01/24. BLE Dopplers ordered for follow-up on 01/22 and was negative for DVT and showed hypoechoic collection in left popliteal fossa.  Foley placed due to ongoing issues with urinary retention.  Due to ongoing confusion and lethargy, antibiotics added due to concerns of UTI and oxycodone dose decreased due to concerns of side effects.  She started developing drainage from her wound on 01/28 as well as watery diarrhea and stool was negative for C. difficile.   She had rising white count to 19.9 with lactic acidosis on 01/31.  She was started on broad-spectrum antibiotics and continued to be febrile with Tmax 103.  ID consulted for input due to ongoing fevers and recommended changing antibiotics to vancomycin, cefepime and metronidazole as well as repeat CTA chest/CT spine.  Chest CT showed multifocal patchy or base opacities throughout RUL, RML and bilateral lower lobes worrisome for multifocal pneumonia as well as acute/subacute right 9-10 rib Fx and CT lumbar spine showed subacute fracture through L3 vertebral body and increased air-fluid and posterior soft tissues unexpected in setting of recent surgery.  CT head was negative for acute changes.     [name] consulted due to worsening of respiratory status due to sepsis from acute meningitis and asked  patient pneumonia and she required intubation on 02/01.  Neurology consulted due to concerns of seizures and EEG showed moderate diffuse encephalopathy.  MRI brain done showing punctate foci of acute ischemia right frontal lobe and diffusion abnormality within the occipital horn of left lateral ventricle with proteinaceous/purulent debris.  MRI spine showed L2-L5 fusion widely patent with decrease in bone marrow edema L3.  Dr. [name] recommended continuing pain and Flagyl for aspiration pneumonia as well as CNS penetration and given hardware exposure, she would require at least 6 weeks of treatment empirically to treat osteomyelitis.  Tracheal aspirate positive for Candida but not felt to be a pathogen in lung per ID.   Blood cultures x 2 negative.  Core track placed for nutritional support. She was briefly extubated on 02/06 but reintubated due to fatigue and increased WOB.  She underwent tracheostomy on 02/06, was extubated to ATC and is tolerating PMV.  Follow-up MRI brain 12/13 showed "dependent diffusion abnormality in occipital horn of left lateral ventricle with ependymal enhancement concerning for ventriculitis". MBS done on 02/12 and patient cleared for regular diet w/thins. She did have emesis with episode of desaturation and copious secretions on 02/13. She has had multiple voiding trials--last on 02/10 and foley was replaced. Wound VAC changed today and reports of fecal management system in place due to ongoing loose stool and MASD/prevent wound contamination. Trach to be downsized to CFS# 4 today.    PT/OT has been working with patient who is showing participation and progression with therapy. She is able to transfer on LLE with +2 max assist. She continues to be limited by weakness, cognitive  deficits requiring increased time to process as well as verbal and tactile cues to follow simple commands, displays poor attention and memory. CIR recommended due to functional decline.      Review of  Systems  Constitutional:  Negative for fever.  HENT:  Negative for hearing loss.   Eyes:  Negative for blurred vision.  Respiratory:  Negative for shortness of breath.   Gastrointestinal:  Positive for diarrhea. Negative for heartburn.  Genitourinary:  Negative for dysuria.  Musculoskeletal:  Positive for falls (multiple in the past.). Negative for back pain.  Neurological:  Positive for focal weakness (RLE shorter and tended to walk with it rotated outward) and weakness. Negative for dizziness.  Psychiatric/Behavioral:  Positive for memory loss (greatly improved c/w past 3 days).           Past Medical History:  Diagnosis Date   Anemia     Anxiety     Arthritis     Depression     GERD (gastroesophageal reflux disease)     Heart murmur      "related to VSD"   High cholesterol     History of blood transfusion      "related to OR" (08/19/2016)   History of hiatal hernia     Hypertension     Hyperthyroidism     Mild cognitive impairment 09/06/2018   Paroxysmal ventricular tachycardia (HCC)     Type II diabetes mellitus ([place])     UTI (urinary tract infection)      being treated with Keflex   Ventricular septal defect             Past Surgical History:  Procedure Laterality Date   ABDOMINAL HYSTERECTOMY       BACK SURGERY       CARDIAC CATHETERIZATION N/A 06/23/2015    Procedure: Left Heart Cath and Coronary Angiography;  Surgeon: Larey Dresser, MD;  Location: [facility];  Service: Cardiovascular;  Laterality: N/A;   CARDIAC CATHETERIZATION   1960    "VSD was so small; didn't need repaired"   EXAM UNDER ANESTHESIA WITH MANIPULATION OF HIP Right 06/02/2014    dr Rhona Raider   FRACTURE SURGERY       HERNIA REPAIR       HIP CLOSED REDUCTION Right 06/02/2014    Procedure: CLOSED MANIPULATION HIP;  Surgeon: Hessie Dibble, MD;  Location: [facility];  Service: Orthopedics;  Laterality: Right;   JOINT REPLACEMENT       JOINT REPLACEMENT       POSTERIOR LUMBAR FUSION 4 LEVEL  N/A 06/26/2022    Procedure: Lumbar One To Lumbar Five Posterior Instrumented Fusion;  Surgeon: Judith Part, MD;  Location: [facility];  Service: Neurosurgery;  Laterality: N/A;   REFRACTIVE SURGERY Bilateral     SHOULDER ARTHROSCOPY Right     SHOULDER OPEN ROTATOR CUFF REPAIR Right     SPINAL FUSION   1996    "t10 down to my coccyx   SPINE HARDWARE REMOVAL       TOTAL ABDOMINAL HYSTERECTOMY       TOTAL HIP ARTHROPLASTY Right 05/10/2014    [facility]      by dr Harrell Gave [place]   TOTAL KNEE ARTHROPLASTY Left     TOTAL SHOULDER ARTHROPLASTY Left 08/19/2016    Procedure: TOTAL SHOULDER ARTHROPLASTY;  Surgeon: Tania Ade, MD;  Location: [facility];  Service: Orthopedics;  Laterality: Left;  Left total shoulder replacement  Family History  Problem Relation Age of Onset   Other Mother          alive   Stroke Father 56        deceased   Dementia Father     Chorea Maternal Grandfather     Dementia Maternal Aunt     Dementia Maternal Aunt     Heart attack Other          multiple uncles have died with myocardial infarction      Social History: Married--husband works part time. Family plans on hiring assistance after d/c. 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  reports that she has been smoking cigarettes. She has never used smokeless tobacco. She reports that she does not drink alcohol and does not use drugs.     Allergies: No Known Allergies           Medications Prior to Admission  Medication Sig Dispense Refill   acetaminophen (TYLENOL) 500 MG tablet Take 1,000 mg by mouth in the morning, at noon, and at bedtime. (0800, 1400 & 2000)       amLODipine (NORVASC) 5 MG tablet Take 5 mg by mouth in the morning. (0730)       carvedilol (COREG) 6.25 MG tablet Take 6.25 mg by mouth in the morning and at bedtime. (0730 & 2000)       cyclobenzaprine (FLEXERIL) 5 MG tablet Take 5 mg by mouth 3 (three) times daily. (0800, 1300 &  2000)       donepezil (ARICEPT) 10 MG tablet Take 10 mg by mouth daily.       fluvoxaMINE (LUVOX) 100 MG tablet Take 100 mg by mouth at bedtime.       gabapentin (NEURONTIN) 100 MG capsule Take 200 mg by mouth in the morning, at noon, and at bedtime. (0800, 1400 & 2000)       lamoTRIgine (LAMICTAL) 200 MG tablet Take 200 mg by mouth daily.       levothyroxine (SYNTHROID) 88 MCG tablet Take 88 mcg by mouth daily before breakfast.       loperamide (IMODIUM) 2 MG capsule Take 2-4 mg by mouth See admin instructions. Take 1 capsule (2 mg) by mouth once daily scheduled & take 1-2 capsules (2-4 mg) by mouth once daily as needed for additional loose stools.       losartan (COZAAR) 100 MG tablet Take 100 mg by mouth at bedtime.       metFORMIN (GLUCOPHAGE) 500 MG tablet Take 500 mg by mouth in the morning and at bedtime.       Multiple Vitamin (MULTIVITAMIN) tablet Take 1 tablet by mouth daily.         ondansetron (ZOFRAN) 4 MG tablet Take 1 tablet (4 mg total) by mouth every 6 (six) hours as needed for nausea or vomiting. 20 tablet 0   OZEMPIC, 2 MG/DOSE, 8 MG/3ML SOPN Inject 2 mg into the skin every Monday.       pantoprazole (PROTONIX) 40 MG tablet Take 80 mg by mouth at bedtime.       QUEtiapine (SEROQUEL) 300 MG tablet Take 600 mg by mouth at bedtime.       Semaglutide, 1 MG/DOSE, (OZEMPIC, 1 MG/DOSE,) 4 MG/3ML SOPN Inject 2 mg into the skin every Monday.       sodium chloride 1 g tablet Take 1 g by mouth every other day. (0900)       traMADol (ULTRAM)  50 MG tablet Take 25 mg by mouth every 6 (six) hours as needed (PAIN).       traZODone (DESYREL) 100 MG tablet Take 100 mg by mouth at bedtime.       buPROPion (WELLBUTRIN XL) 300 MG 24 hr tablet Take 300 mg by mouth in the morning.  (0730)       nicotine polacrilex (NICORETTE) 2 MG gum Take 2 mg by mouth daily.              Home: Home Living Family/patient expects to be discharged to:: Skilled nursing facility Living Arrangements:  Spouse/significant other Available Help at Discharge: Family, Available 24 hours/day (2 daughters, son , spouse and hired Programmer, multimedia for 24/7) Type of Home: [facility] Access: Ramped entrance [place]: One level Bathroom Shower/Tub: Multimedia programmer: Handicapped height [place] Accessibility: Yes Additional Comments: Was Mod I prior to admit in 12/23 with RW short distances and wheelchair long disctances  Lives With: Spouse   Functional History: Prior Function Prior Level of Function : Needs assist  Cognitive Assist : Mobility (cognitive), ADLs (cognitive) Mobility (Cognitive): Intermittent cues ADLs (Cognitive): Intermittent cues Physical Assist : Mobility (physical), ADLs (physical) Mobility (physical): Bed mobility, Transfers ADLs (physical): Grooming, Bathing, Dressing, IADLs, Toileting, Feeding Mobility Comments: to return home with family 24/7 min asisst ADLs Comments: needed assist with all BADL tasks   Functional Status:  Mobility: Bed Mobility Overal bed mobility: Needs Assistance Bed Mobility: Rolling, Sidelying to Sit Rolling: Mod assist, +2 for safety/equipment Sidelying to sit: +2 for physical assistance, Max assist Supine to sit: Max assist, +2 for physical assistance Sit to supine: +2 for physical assistance, Max assist Sit to sidelying: Max assist, +2 for safety/equipment General bed mobility comments: using rail and bit to help assist for legs off bed and to lift trunk Transfers Overall transfer level: Needs assistance Equipment used: Ambulation equipment used Transfers: Bed to chair/wheelchair/BSC Sit to Stand: Max assist, +2 physical assistance, +2 safety/equipment Bed to/from chair/wheelchair/BSC transfer type:: Squat pivot Stand pivot transfers: Max assist, +2 safety/equipment, +2 physical assistance Squat pivot transfers: +2 physical assistance, Max assist  Lateral/Scoot Transfers: Total assist, +2 physical assistance Transfer via Lift  Equipment: [name] General transfer comment: up on L leg mainly for pivot to recliner with armrest lowered and using bed pad to pivot due to skin issued on her back and gait belt not used   ADL: ADL Overall ADL's : Needs assistance/impaired Eating/Feeding: NPO Eating/Feeding Details (indicate cue type and reason): Declined proper set-up of bed for adequate positioning to eat. Only acceptable of the HOB at ~30-40 degrees. Grooming: Moderate assistance, Oral care Grooming Details (indicate cue type and reason): suction oral care with RUE, required setup and proximal support with cueing for task attention Upper Body Bathing: Total assistance, Bed level Lower Body Bathing: Total assistance, Bed level Upper Body Dressing : Total assistance, Bed level Lower Body Dressing: Total assistance, +2 for physical assistance, +2 for safety/equipment, Sit to/from stand Toilet Transfer: +2 for physical assistance, +2 for safety/equipment, Maximal assistance Toilet Transfer Details (indicate cue type and reason): using stedy Toileting- Clothing Manipulation and Hygiene: Bed level, Total assistance Functional mobility during ADLs: Maximal assistance, +2 for physical assistance, +2 for safety/equipment General ADL Comments: pt easily distracted, completed transfer so she could visit with her son   Cognition: Cognition Overall Cognitive Status: Impaired/Different from baseline Arousal/Alertness: Awake/alert Orientation Level: Oriented X4 (forgetful) Attention: Sustained, Selective Sustained Attention: Impaired Sustained Attention Impairment: Functional complex, Verbal basic Selective  Attention: Impaired Selective Attention Impairment: Verbal complex, Functional complex Memory: Impaired Memory Impairment: Retrieval deficit Awareness: Appears intact Problem Solving: Appears intact Safety/Judgment: Appears intact Cognition Arousal/Alertness: Awake/alert Behavior During Therapy: Anxious Overall Cognitive  Status: Impaired/Different from baseline Area of Impairment: Attention, Memory, Following commands, Safety/judgement, Problem solving, Awareness Orientation Level: Disoriented to (date, once re-oriented able to recall Super Bowl sunday) Current Attention Level: Sustained Memory: Decreased recall of precautions, Decreased short-term memory Following Commands: Follows one step commands with increased time (and multimodal cues) Safety/Judgement: Decreased awareness of safety, Decreased awareness of deficits Awareness: Intellectual Problem Solving: Slow processing, Decreased initiation, Difficulty sequencing, Requires verbal cues, Requires tactile cues General Comments: frustrated and perseverating on the fact that she was trying to visit with her sister when PT arrived. Difficult to assess due to: Tracheostomy   Physical Exam: Blood pressure (!) 143/90, pulse 84, temperature 98.8 F (37.1 C), temperature source Oral, resp. rate 14, height 5' (1.524 m), weight 67.6 kg, SpO2 98 %. Physical Exam Vitals and nursing note reviewed.  Constitutional:      Appearance: Normal appearance.  HENT:     Head: Normocephalic and atraumatic.     Right Ear: External ear normal.     Left Ear: External ear normal.     Nose:     Comments: NGT in place    Mouth/Throat:     Mouth: Mucous membranes are moist.     Pharynx: Oropharynx is clear.  Eyes:     Extraocular Movements: Extraocular movements intact.     Conjunctiva/sclera: Conjunctivae normal.     Pupils: Pupils are equal, round, and reactive to light.  Neck:     Comments: #6 trach in place. Pt able to speak around trach fairly well. Excellent phonation with PMV Cardiovascular:     Rate and Rhythm: Normal rate and regular rhythm.     Heart sounds: No murmur heard.    No gallop.  Pulmonary:     Effort: Pulmonary effort is normal. No respiratory distress.     Breath sounds: No wheezing.  Abdominal:     General: Bowel sounds are normal. There is no  distension.     Palpations: Abdomen is soft.     Tenderness: There is no abdominal tenderness.  Genitourinary:    Comments: Rectal tube and foley in place.  Musculoskeletal:        General: Swelling present.     Cervical back: Normal range of motion.     Comments: Right leg discrepancy with weakness and leg rotated outward. Dependent edema Right forearm and hand and left forearm.   Skin:    Comments: Well healed old right thigh incision. Wound VAC in place, low back  Neurological:     Mental Status: She is alert and oriented to person, place, and time.     Comments: Alert and oriented x 3 (needed some help with day) much improved insight and awareness. [place] Memory. Normal language and speech. Cranial nerve exam unremarkable. UE 4/5 prox to distal. RLE 2-/5 prox to 4/5 distally. LLE 2+/5 prox to 4/5 distally. Senses pain in all 4 limbs. No abnl resting tone.   Psychiatric:        Mood and Affect: Mood normal.        Behavior: Behavior normal.        Lab Results Last 48 Hours        Results for orders placed or performed during the hospital encounter of 08/09/22 (from the past 48 hour(s))  Glucose, capillary  Status: Abnormal    Collection Time: 09/01/22  3:17 PM  Result Value Ref Range    Glucose-Capillary 135 (H) 70 - 99 mg/dL      Comment: Glucose reference range applies only to samples taken after fasting for at least 8 hours.  Glucose, capillary     Status: Abnormal    Collection Time: 09/02/22  5:23 AM  Result Value Ref Range    Glucose-Capillary 131 (H) 70 - 99 mg/dL      Comment: Glucose reference range applies only to samples taken after fasting for at least 8 hours.  CBC     Status: Abnormal    Collection Time: 09/02/22 10:30 AM  Result Value Ref Range    WBC 8.9 4.0 - 10.5 K/uL    RBC 2.78 (L) 3.87 - 5.11 MIL/uL    Hemoglobin 9.3 (L) 12.0 - 15.0 g/dL    HCT 28.1 (L) 36.0 - 46.0 %    MCV 101.1 (H) 80.0 - 100.0 fL    MCH 33.5 26.0 - 34.0 pg    MCHC 33.1 30.0 - 36.0  g/dL    RDW 20.1 (H) 11.5 - 15.5 %    Platelets 441 (H) 150 - 400 K/uL    nRBC 0.0 0.0 - 0.2 %      Comment: Performed at [facility] [address], [place], [place] [address]  Comprehensive metabolic panel     Status: Abnormal    Collection Time: 09/02/22 10:30 AM  Result Value Ref Range    Sodium 130 (L) 135 - 145 mmol/L    Potassium 3.4 (L) 3.5 - 5.1 mmol/L    Chloride 93 (L) 98 - 111 mmol/L    CO2 28 22 - 32 mmol/L    Glucose, Bld 109 (H) 70 - 99 mg/dL      Comment: Glucose reference range applies only to samples taken after fasting for at least 8 hours.    BUN 7 (L) 8 - 23 mg/dL    Creatinine, Ser 0.34 (L) 0.44 - 1.00 mg/dL    Calcium 8.3 (L) 8.9 - 10.3 mg/dL    Total Protein 5.2 (L) 6.5 - 8.1 g/dL    Albumin 2.1 (L) 3.5 - 5.0 g/dL    AST 22 15 - 41 U/L    ALT 12 0 - 44 U/L    Alkaline Phosphatase 61 38 - 126 U/L    Total Bilirubin 0.5 0.3 - 1.2 mg/dL    GFR, Estimated >60 >60 mL/min      Comment: (NOTE) Calculated using the CKD-EPI Creatinine Equation (2021)      Anion gap 9 5 - 15      Comment: Performed at [facility], [place] [address], [place], [place] [address]  Magnesium     Status: None    Collection Time: 09/02/22 10:30 AM  Result Value Ref Range    Magnesium 1.7 1.7 - 2.4 mg/dL      Comment: Performed at [facility] [address], [place], [place] [address]  Glucose, capillary     Status: Abnormal    Collection Time: 09/02/22  7:52 PM  Result Value Ref Range    Glucose-Capillary 112 (H) 70 - 99 mg/dL      Comment: Glucose reference range applies only to samples taken after fasting for at least 8 hours.  Glucose, capillary     Status: Abnormal    Collection Time: 09/02/22 11:32 PM  Result Value Ref Range  Glucose-Capillary 113 (H) 70 - 99 mg/dL      Comment: Glucose reference range applies only to samples taken after fasting for at least 8 hours.  Vitamin B12     Status: None    Collection Time: 09/03/22  4:05 AM  Result Value Ref  Range    Vitamin B-12 498 180 - 914 pg/mL      Comment: (NOTE) This assay is not validated for testing neonatal or myeloproliferative syndrome specimens for Vitamin B12 levels. Performed at [facility], [place] [address] [place], [place] [address]    Folate     Status: None    Collection Time: 09/03/22  4:05 AM  Result Value Ref Range    Folate 12.3 >5.9 ng/mL      Comment: Performed at [facility] [address], [place], [place] [address]  Iron and TIBC     Status: Abnormal    Collection Time: 09/03/22  4:05 AM  Result Value Ref Range    Iron 57 28 - 170 ug/dL    TIBC 162 (L) 250 - 450 ug/dL    Saturation Ratios 35 (H) 10.4 - 31.8 %    UIBC 105 ug/dL      Comment: Performed at [facility], [place] [address], [place], [place] [address]  Ferritin     Status: None    Collection Time: 09/03/22  4:05 AM  Result Value Ref Range    Ferritin 80 11 - 307 ng/mL      Comment: Performed at [facility], [place] [address] [place], [place] [address]  Reticulocytes     Status: Abnormal    Collection Time: 09/03/22  4:05 AM  Result Value Ref Range    Retic Ct Pct 6.7 (H) 0.4 - 3.1 %    RBC. 2.57 (L) 3.87 - 5.11 MIL/uL    Retic Count, Absolute 173.2 19.0 - 186.0 K/uL    Immature Retic Fract 26.7 (H) 2.3 - 15.9 %      Comment: Performed at [facility] [address], [place], [place] [address]  Comprehensive metabolic panel     Status: Abnormal    Collection Time: 09/03/22  4:05 AM  Result Value Ref Range    Sodium 130 (L) 135 - 145 mmol/L    Potassium 3.2 (L) 3.5 - 5.1 mmol/L    Chloride 94 (L) 98 - 111 mmol/L    CO2 26 22 - 32 mmol/L    Glucose, Bld 137 (H) 70 - 99 mg/dL      Comment: Glucose reference range applies only to samples taken after fasting for at least 8 hours.    BUN 7 (L) 8 - 23 mg/dL    Creatinine, Ser <0.30 (L) 0.44 - 1.00 mg/dL    Calcium 7.9 (L) 8.9 - 10.3 mg/dL    Total Protein 4.8 (L) 6.5 - 8.1 g/dL    Albumin 1.9 (L) 3.5 - 5.0 g/dL     AST 19 15 - 41 U/L    ALT 10 0 - 44 U/L    Alkaline Phosphatase 60 38 - 126 U/L    Total Bilirubin 0.2 (L) 0.3 - 1.2 mg/dL    GFR, Estimated NOT CALCULATED >60 mL/min      Comment: (NOTE) Calculated using the CKD-EPI Creatinine Equation (2021)      Anion gap 10 5 - 15      Comment: Performed at [facility] [address],  [place], [place] [address]  CBC     Status: Abnormal    Collection Time: 09/03/22  4:05 AM  Result Value Ref Range    WBC 7.8 4.0 - 10.5 K/uL    RBC 2.52 (L) 3.87 - 5.11 MIL/uL    Hemoglobin 8.4 (L) 12.0 - 15.0 g/dL    HCT 25.3 (L) 36.0 - 46.0 %    MCV 100.4 (H) 80.0 - 100.0 fL    MCH 33.3 26.0 - 34.0 pg    MCHC 33.2 30.0 - 36.0 g/dL    RDW 20.4 (H) 11.5 - 15.5 %    Platelets 403 (H) 150 - 400 K/uL    nRBC 0.0 0.0 - 0.2 %      Comment: Performed at [facility] [address], [place], [place] [address]  Magnesium     Status: None    Collection Time: 09/03/22  4:05 AM  Result Value Ref Range    Magnesium 1.7 1.7 - 2.4 mg/dL      Comment: Performed at [facility] [address], [place], [place] [address]  Glucose, capillary     Status: Abnormal    Collection Time: 09/03/22  7:43 AM  Result Value Ref Range    Glucose-Capillary 129 (H) 70 - 99 mg/dL      Comment: Glucose reference range applies only to samples taken after fasting for at least 8 hours.      Imaging Results (Last 48 hours)  No results found.         Blood pressure (!) 143/90, pulse 84, temperature 98.8 F (37.1 C), temperature source Oral, resp. rate 14, height 5' (1.524 m), weight 67.6 kg, SpO2 98 %.   Medical Problem List and Plan: 1. Functional deficits secondary to osteomyelitis,infection of lumbar surgical site complicated by meningitis/ventriculitis.             -pt also with significant deconditioning             -patient may not yet shower             -ELOS/Goals: 20-27 days, supervision to min assist with PT, OT, SLP 2.   Antithrombotics: -DVT/anticoagulation:  Pharmaceutical: Lovenox daily             -antiplatelet therapy: N/a 3. Pain Management: [place] for pain control w/ Flexeril TID 4. Mood/Behavior/Sleep: LCSW to follow for evaluation and support.             --Trazodone at bedtime for  insomnia/mood              -antipsychotic agents:  Luvox and Lamictal 5. Neuropsych/cognition: This patient is not fully capable of making decisions on her own behalf. 6. Skin/Wound Care: WOC to follow for wound VAC changes             --MASD managed by fecal system.  7. Fluids/Electrolytes/Nutrition: Monitor I/O. Continue tube feeds-->likely contributing to diarrhea             -pt had unrecorded intake today             -both she and daughters would like to transition from NGT             -will cut TF in half tonight             -monitor intake closely             --Check CMET in am 8.Sepsis w/ osteomyelitis/meningitis/encephalitis: Continue Cefepime, Vanc and Flagyl for  at least 6 weeks             --end date per ID. Will follow up on plan during admit  9. H/o PVT:  Monitor BP/heart rate TID--on coreg and  10. Urinary retention: Has failed 4 voiding trails. Has had foley since 12/23             -maintain foley cath, foley mgt per protocol -- Continue Flomax. Will d/c urecholine.  11. VDRF s/p trach: Tolerating PMSV well -I ordered downsizing to CFS # 4 today--tolerated well -continue ATC w/ pulmonary hygiene -begin day time plugging of trach tomorrow. Decannulate soon. 12. T2DM: Monitor BS ac/hs and use SSI for elevated BS.              --continue tube feeds with supplements between meals due to poor intake             --continue liberalized diet for now.  13. Hypokalemia: Recurrent likely due to diarrhea/intake.              --will add standing dose BID -->recheck labs on 02/17 and 02/19 14. Anemia of critical illness: 15. Hyponatremia/hypochloremia: Improving 125/87-->130/94.  --Monitor for now. Recheck  CMET in am.  16. Encephalopathy/Delirium: Continues to have intermittent confusion--but much improved compared to 3 days ago per family.  --Continue delirium precautions. 17. Loose stools likely d/t TF and abx.             -reducing TF as above.              -add probiotics and fiber     Bary Leriche, PA-C 09/03/2022  I have personally performed a face to face diagnostic evaluation of this patient and formulated the key components of the plan.  Additionally, I have personally reviewed laboratory data, imaging studies, as well as relevant notes and concur with the physician assistant's documentation above.  The patient's status has not changed from the original H&P.  Any changes in documentation from the acute care chart have been noted above.  [name], MD, [facility]

## 2022-09-03 NOTE — Progress Notes (Signed)
Pharmacy Antibiotic Note  [name] is a 67 y.o. 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 Pharmacy has been consulted for Vancomycin + Cefepime dosing.   Last vancomycin trough therapeutic at 16 mcg/mL.  Renal function remains stable.  Afebrile, WBC WNL.  Plan: Continue vanc 1221m IV Q12H Continue Cefepime 2g IV Q8H and Flagyl 5027mIV Q8H Monitor renal function, culture results, weekly vanc trough  Height: 5' (152.4 cm) Weight:  (Bedscale not working) IBW/kg (Calculated) : 45.5  Temp (24hrs), Avg:98.1 F (36.7 C), Min:97.8 F (36.6 C), Max:98.5 F (36.9 C)  Recent Labs  Lab 08/29/22 0355 08/30/22 0358 08/30/22 1139 08/31/22 0546 09/01/22 1157 09/02/22 1030 09/03/22 0405  WBC 9.3 8.6  --  8.8  --  8.9 7.8  CREATININE <0.30* <0.30*  --  <0.30*  --  0.34* <0.30*  VANCOTROUGH  --   --  20  --  16  --   --      CrCl cannot be calculated (This lab value cannot be used to calculate CrCl because it is not a number: <0.30).    No Known Allergies  Cipro 1/25 >>1/26  Doxy 1/23 >>1/31 Cefadroxil 1/26 >>1/31 Vanc 1/31>> Zosyn 1/31>>2/1 Cefepime 2/1 >> Flagyl 2/1 >>  2/5 VT = 7 on 75028m12 >> 1000m40m 2/6 VT = 15 on 1g q8 2/9 VT = 18 continue current regimen 2/12 VT = 20 >> change to 1250mg70m 2/14 VT = 16 mcg/mL >> no change  1/22 MRSA PCR: neg;  SA: positive 1/28 C.diff - negative 1/30 BCx x2: neg 1/30 BCx x2: neg 2/2 Resp Cx: rare yeast w/ pseudohyphae (candida alb) 2/6 TA - C.albicans and mold (CCM not concerned with mold)  Herma Uballe D. Shanai Lartigue,Mina MarblermD, BCPS, BCCCPStickney/2024, 10:29 AM

## 2022-09-03 NOTE — Procedures (Signed)
Tracheostomy Change Note  Patient Details:   Name: [name] DOB: 20-Jul-1955 MRN: [id number]    Airway Documentation:     Evaluation  O2 sats: stable throughout Complications: No apparent complications Patient did tolerate procedure well. Bilateral Breath Sounds: Clear, Diminished   RT x2 at bedside to remove #6 shiley cuffed trach and replaced with #4 shiley cuffless trach. Good color change was present. Pt tolerated well with SVS.   Jorje Guild 09/03/2022, 4:42 PM

## 2022-09-03 NOTE — Care Management Important Message (Signed)
Important Message  Patient Details  Name: [name] MRN: [id number] Date of Birth: 16-Mar-1956   Medicare Important Message Given:  Yes     [name] 09/03/2022, 11:29 AM

## 2022-09-03 NOTE — TOC Transition Note (Signed)
Transition of Care (TOC) - CM/SW Discharge Note Marvetta Gibbons RN,BSN Transitions of Care Unit 4NP (Non Trauma)- RN Case Manager See Treatment Team for direct Phone #   Patient Details  Name: [name] MRN: [id number] Date of Birth: 07-Sep-1955  Transition of Care [facility]) CM/SW Contact:  Dawayne Patricia, RN Phone Number: 09/03/2022, 2:09 PM   Clinical Narrative:    Received notice from [facility] liaison that pt has insurance auth and bed available for admit today. Pt has been medically cleared to transition to [facility] later today. WOC will continue to follow for wound VAC needs.  Pt has IV abx with end date of 09/30/22 New Trach- placed 08/24/22  Pt to transition to [facility] who will follow for transition needs post rehab stay.    Final next level of care: IP Rehab Facility Barriers to Discharge: Barriers Resolved   Patient Goals and CMS Choice CMS Medicare.gov Compare Post Acute Care list provided to:: Patient Choice offered to / list presented to : Patient  Discharge Placement                   [facility]      Discharge Plan and Services Additional resources added to the After Visit Summary for   In-house Referral: Clinical Social Work Discharge Planning Services: CM Consult Post Acute Care Choice: IP Rehab          DME Arranged: N/A DME Agency: NA       HH Arranged: NA HH Agency: NA        Social Determinants of Health (SDOH) Interventions [place]: No Food Insecurity (06/27/2022)  Housing: Low Risk  (06/27/2022)  Transportation Needs: No Transportation Needs (06/27/2022)  Utilities: Not At Risk (06/27/2022)  Tobacco Use: High Risk (08/09/2022)     Readmission Risk Interventions    09/03/2022    2:08 PM 07/14/2022    2:53 PM  Readmission Risk Prevention Plan  Transportation Screening Complete Complete  PCP or Specialist Appt within 5-7 Days  Complete  Home Care Screening  Complete   Medication Review (RN CM)  Complete  Medication Review (RN Care Manager) Complete   PCP or Specialist appointment within 3-5 days of discharge Not Complete   PCP/Specialist Appt Not Complete comments pt going to [facility]   [place] or [place] Complete   SW Recovery Care/Counseling Consult Complete   [place] Not Applicable

## 2022-09-03 NOTE — Progress Notes (Signed)
[name], MD  Physician Physical Medicine and Rehabilitation   Consult Note    Addendum   Date of Service: 08/31/2022  8:20 AM  Related encounter: Admission (Current) from 08/09/2022 in [facility] All Collapse All  Show:Clear all [x]$ Written[x]$ Templated[]$ Copied  Added by: [x]$ [name], MD  []$ Hover for details          Physical Medicine and Rehabilitation Consult Reason for Consult: Impaired functional mobility Referring Physician: Zada Finders     HPI: [name] is a 67 y.o. female with a history of L1 L5 PLIF on 06/26/22 for unstable fracture through prior lumbar fusion who was readmitted on 07/08/22 for wound dehiscence. She was discharged to SNF after both admissions. [name]  was admitted again on August 09, 2018 for with wound dehiscence and exposed hardware with unstable lumbar spine fracture at the surgical site.  The patient underwent revision of a complex lumbar wound with removal of left L1, L2, L3, L4, and L5 lumbar screws and rod, removal of right L1 screw, revision of L1-L5 posterior instrumented fusion by Dr. Marcello Moores O's regard.  Patient was noted to be lethargic postoperatively and was diagnosed with a UTI treated with cefadrox.  Pain medication was decreased also.  Patient was also hyponatremic.  Patient was seen by [facility] nurse for placement of VAC on 08/18/2022.  The same day patient was found to be tachypneic and there was concern for early sepsis and pulmonary edema.  Hospitalist were consulted to follow patient.  Abx were started empirically for sepsis.  No obvious source of infection was identified and infectious disease was consulted.  Critical care was also consulted for increased confusion and worsening shortness of breath.  CTA of the chest demonstrated multifocal infiltrates concerning for pneumonia due to aspiration.  Patient was ultimately intubated.  CT of the lumbar spine was unremarkable for acute  changes.  MRI of the brain showed a punctate focus of ischemia in the right frontal lobe as well as some changes superior horn of the left lateral ventricle concerning for proteinaceous, purulent debris.  Flagyl was added for aspiration pneumonia as well as CNS coverage.  Blood cultures were negative.  Extubation was attempted on 08/23/2022 but patient failed and had to be reintubated and tracheostomy was placed the same day.  Patient is currently on vancomycin, cefepime, and Flagyl with the plan of 6 weeks of total antibiotic coverage.  Patient has continued to work with physical, occupational, speech therapy through this time.  PT notes progress over the last week.  Yesterday, she was able to maintain sitting balance with min guard assistance for 10 minutes with rest breaks.  She still demonstrates decreased attention span and short-term memory deficits with delayed processing.  She is able to move in her bed with maximal assistance.  Sit to stand transfers are max assist +2.  Patient is not yet ambulating.     Review of Systems  Constitutional:  Negative for fever.  HENT:  Negative for hearing loss.   Respiratory:  Positive for cough.   Cardiovascular:  Negative for palpitations.  Gastrointestinal:  Negative for nausea.  Genitourinary:  Negative for dysuria.  Musculoskeletal:  Positive for back pain and myalgias.  Neurological:  Positive for weakness and headaches.  Psychiatric/Behavioral:  Positive for memory loss.         Past Medical History:  Diagnosis Date   Anemia     Anxiety     Arthritis  Depression     GERD (gastroesophageal reflux disease)     Heart murmur      "related to VSD"   High cholesterol     History of blood transfusion      "related to OR" (08/19/2016)   History of hiatal hernia     Hypertension     Hyperthyroidism     Mild cognitive impairment 09/06/2018   Paroxysmal ventricular tachycardia (HCC)     Type II diabetes mellitus ([place])     UTI (urinary tract  infection)      being treated with Keflex   Ventricular septal defect           Past Surgical History:  Procedure Laterality Date   ABDOMINAL HYSTERECTOMY       BACK SURGERY       CARDIAC CATHETERIZATION N/A 06/23/2015    Procedure: Left Heart Cath and Coronary Angiography;  Surgeon: Larey Dresser, MD;  Location: [facility];  Service: Cardiovascular;  Laterality: N/A;   CARDIAC CATHETERIZATION   1960    "VSD was so small; didn't need repaired"   EXAM UNDER ANESTHESIA WITH MANIPULATION OF HIP Right 06/02/2014    dr Rhona Raider   FRACTURE SURGERY       HERNIA REPAIR       HIP CLOSED REDUCTION Right 06/02/2014    Procedure: CLOSED MANIPULATION HIP;  Surgeon: Hessie Dibble, MD;  Location: [place];  Service: Orthopedics;  Laterality: Right;   JOINT REPLACEMENT       JOINT REPLACEMENT       POSTERIOR LUMBAR FUSION 4 LEVEL N/A 06/26/2022    Procedure: Lumbar One To Lumbar Five Posterior Instrumented Fusion;  Surgeon: Judith Part, MD;  Location: [place];  Service: Neurosurgery;  Laterality: N/A;   REFRACTIVE SURGERY Bilateral     SHOULDER ARTHROSCOPY Right     SHOULDER OPEN ROTATOR CUFF REPAIR Right     SPINAL FUSION   1996    "t10 down to my coccyx   SPINE HARDWARE REMOVAL       TOTAL ABDOMINAL HYSTERECTOMY       TOTAL HIP ARTHROPLASTY Right 05/10/2014    [place]      by dr Harrell Gave [place]   TOTAL KNEE ARTHROPLASTY Left     TOTAL SHOULDER ARTHROPLASTY Left 08/19/2016    Procedure: TOTAL SHOULDER ARTHROPLASTY;  Surgeon: Tania Ade, MD;  Location: [place];  Service: Orthopedics;  Laterality: Left;  Left total shoulder replacement         Family History  Problem Relation Age of Onset   Other Mother          alive   Stroke Father 53        deceased   Dementia Father     Chorea Maternal Grandfather     Dementia Maternal Aunt     Dementia Maternal Aunt     Heart attack Other          multiple uncles have died with myocardial infarction    Social History:   reports that she has been smoking cigarettes. She has never used smokeless tobacco. She reports that she does not drink alcohol and does not use drugs. Allergies: No Known Allergies       Medications Prior to Admission  Medication Sig Dispense Refill   acetaminophen (TYLENOL) 500 MG tablet Take 1,000 mg by mouth in the morning, at noon, and at bedtime. (0800, 1400 & 2000)       amLODipine (NORVASC) 5  MG tablet Take 5 mg by mouth in the morning. (0730)       carvedilol (COREG) 6.25 MG tablet Take 6.25 mg by mouth in the morning and at bedtime. (0730 & 2000)       cyclobenzaprine (FLEXERIL) 5 MG tablet Take 5 mg by mouth 3 (three) times daily. (0800, 1300 & 2000)       donepezil (ARICEPT) 10 MG tablet Take 10 mg by mouth daily.       fluvoxaMINE (LUVOX) 100 MG tablet Take 100 mg by mouth at bedtime.       gabapentin (NEURONTIN) 100 MG capsule Take 200 mg by mouth in the morning, at noon, and at bedtime. (0800, 1400 & 2000)       lamoTRIgine (LAMICTAL) 200 MG tablet Take 200 mg by mouth daily.       levothyroxine (SYNTHROID) 88 MCG tablet Take 88 mcg by mouth daily before breakfast.       loperamide (IMODIUM) 2 MG capsule Take 2-4 mg by mouth See admin instructions. Take 1 capsule (2 mg) by mouth once daily scheduled & take 1-2 capsules (2-4 mg) by mouth once daily as needed for additional loose stools.       losartan (COZAAR) 100 MG tablet Take 100 mg by mouth at bedtime.       metFORMIN (GLUCOPHAGE) 500 MG tablet Take 500 mg by mouth in the morning and at bedtime.       Multiple Vitamin (MULTIVITAMIN) tablet Take 1 tablet by mouth daily.         ondansetron (ZOFRAN) 4 MG tablet Take 1 tablet (4 mg total) by mouth every 6 (six) hours as needed for nausea or vomiting. 20 tablet 0   OZEMPIC, 2 MG/DOSE, 8 MG/3ML SOPN Inject 2 mg into the skin every Monday.       pantoprazole (PROTONIX) 40 MG tablet Take 80 mg by mouth at bedtime.       QUEtiapine (SEROQUEL) 300 MG tablet Take 600 mg by mouth at  bedtime.       Semaglutide, 1 MG/DOSE, (OZEMPIC, 1 MG/DOSE,) 4 MG/3ML SOPN Inject 2 mg into the skin every Monday.       sodium chloride 1 g tablet Take 1 g by mouth every other day. (0900)       traMADol (ULTRAM) 50 MG tablet Take 25 mg by mouth every 6 (six) hours as needed (PAIN).       traZODone (DESYREL) 100 MG tablet Take 100 mg by mouth at bedtime.       buPROPion (WELLBUTRIN XL) 300 MG 24 hr tablet Take 300 mg by mouth in the morning.  (0730)       nicotine polacrilex (NICORETTE) 2 MG gum Take 2 mg by mouth daily.          Home: Home Living Family/patient expects to be discharged to:: [place]: Other (Comment) (plan to return to [facility] place to receive continued rehab)  Functional History: Prior Function Prior Level of Function : Needs assist  Cognitive Assist : Mobility (cognitive), ADLs (cognitive) Mobility (Cognitive): Intermittent cues ADLs (Cognitive): Intermittent cues Physical Assist : Mobility (physical), ADLs (physical) Mobility (physical): Bed mobility, Transfers ADLs (physical): Grooming, Bathing, Dressing, IADLs, Toileting, Feeding Mobility Comments: Back to SNF likely, reports doing minimal ambulation with RW and standing. ADLs Comments: needed assist with all BADL tasks Functional Status:  Mobility: Bed Mobility Overal bed mobility: Needs Assistance Bed Mobility: Supine to Sit Rolling: +2 for physical assistance, Max  assist Sidelying to sit: Max assist, +2 for safety/equipment, HOB elevated Supine to sit: +2 for physical assistance, HOB elevated, Max assist Sit to supine: Total assist, +2 for physical assistance, +2 for safety/equipment Sit to sidelying: Max assist, +2 for safety/equipment General bed mobility comments: pt with increased ability to move LEs to EOB and trunk elevation initiation, maxA to come fully upright and to scoot to EOB with feet on the floor Transfers Overall transfer level: Needs  assistance Equipment used: 2 person hand held assist (face to face transfer with use of bed pad under pt as gait belt not optimal for use with where wound vac is on back) Transfers: Sit to/from Stand, Bed to chair/wheelchair/BSC Sit to Stand: Max assist, +2 physical assistance, +2 safety/equipment Bed to/from chair/wheelchair/BSC transfer type:: Stand pivot Stand pivot transfers: Max assist, +2 safety/equipment, +2 physical assistance  Lateral/Scoot Transfers: Total assist, +2 physical assistance General transfer comment: +2 max assist using bed pad to come into standing pivoting to recliner with max assist +2, completed 2 sit to stand prior to pvt to Recliner, pt with R LE shorter than the other limiting her ability to WB but was able to tolerate WB on L LE without knee buckling   ADL: ADL Overall ADL's : Needs assistance/impaired Eating/Feeding: NPO Eating/Feeding Details (indicate cue type and reason): Declined proper set-up of bed for adequate positioning to eat. Only acceptable of the HOB at ~30-40 degrees. Grooming: Moderate assistance, Oral care Grooming Details (indicate cue type and reason): suction oral care with RUE, required setup and proximal support with cueing for task attention Upper Body Bathing: Total assistance, Bed level Lower Body Bathing: Total assistance, Bed level Upper Body Dressing : Total assistance, Bed level Lower Body Dressing: Total assistance, +2 for physical assistance, +2 for safety/equipment, Sit to/from stand Toilet Transfer: +2 for physical assistance, +2 for safety/equipment, Stand-pivot, Maximal assistance Toilet Transfer Details (indicate cue type and reason): simulated from recliner to [place] and Hygiene: Bed level, Total assistance Functional mobility during ADLs: Maximal assistance, +2 for physical assistance, +2 for safety/equipment   Cognition: Cognition Overall Cognitive Status: Impaired/Different from  baseline Orientation Level: Intubated/Tracheostomy - Unable to assess Cognition Arousal/Alertness: Awake/alert Behavior During Therapy: WFL for tasks assessed/performed Overall Cognitive Status: Impaired/Different from baseline Area of Impairment: Attention, Memory, Following commands, Safety/judgement, Problem solving, Awareness Orientation Level: Disoriented to (date, once re-oriented able to recall [employer] sunday) Current Attention Level: Focused Memory: Decreased recall of precautions, Decreased short-term memory Following Commands: Follows one step commands with increased time Safety/Judgement: Decreased awareness of safety, Decreased awareness of deficits Awareness: Intellectual Problem Solving: Slow processing, Decreased initiation, Difficulty sequencing, Requires verbal cues, Requires tactile cues General Comments: pt repeating herself frequently, poor recall and attention to tasks  requiring frequent cues to stay on task Difficult to assess due to: Tracheostomy   Blood pressure (!) 143/81, pulse 92, temperature 98 F (36.7 C), temperature source Oral, resp. rate 15, height 5' (1.524 m), weight 67.6 kg, SpO2 100 %. Physical Exam Constitutional:      General: She is not in acute distress. HENT:     Head: Normocephalic.     Right Ear: External ear normal.     Left Ear: External ear normal.     Nose:     Comments: Cortrak in place Eyes:     Extraocular Movements: Extraocular movements intact.     Pupils: Pupils are equal, round, and reactive to light.  Neck:     Comments: #6  trach in place, cuff down. Raspy whisper when speaking Cardiovascular:     Rate and Rhythm: Normal rate.  Pulmonary:     Effort: Pulmonary effort is normal.  Abdominal:     General: There is no distension.     Palpations: Abdomen is soft.  Genitourinary:    Comments: Rectal tube Musculoskeletal:     Right lower leg: No edema.     Left lower leg: No edema.  Skin:    General: Skin is warm.   Neurological:     Mental Status: She is alert.     Comments: Pt is alert, oriented to "[place]", follows basic commands with some delay. Unable to phonate around trach but can produce raspy whisper. No focal CN abnl. UE motor 4/5 prox to distal. LE 2- HF, 2 KE and 3+ ADF/APF. Senses pain in all 4 limbs. No abnl resting tone. No obvious limb ataxia  Psychiatric:     Comments: Pt smiling, cooperative        Lab Results Last 24 Hours       Results for orders placed or performed during the hospital encounter of 08/09/22 (from the past 24 hour(s))  Glucose, capillary     Status: Abnormal    Collection Time: 08/30/22 11:06 AM  Result Value Ref Range    Glucose-Capillary 175 (H) 70 - 99 mg/dL  Vancomycin, trough     Status: None    Collection Time: 08/30/22 11:39 AM  Result Value Ref Range    Vancomycin Tr 20 15 - 20 ug/mL  Glucose, capillary     Status: Abnormal    Collection Time: 08/30/22  3:23 PM  Result Value Ref Range    Glucose-Capillary 141 (H) 70 - 99 mg/dL  Glucose, capillary     Status: Abnormal    Collection Time: 08/30/22  7:27 PM  Result Value Ref Range    Glucose-Capillary 137 (H) 70 - 99 mg/dL  Glucose, capillary     Status: Abnormal    Collection Time: 08/30/22 11:07 PM  Result Value Ref Range    Glucose-Capillary 108 (H) 70 - 99 mg/dL  Glucose, capillary     Status: Abnormal    Collection Time: 08/31/22  3:26 AM  Result Value Ref Range    Glucose-Capillary 102 (H) 70 - 99 mg/dL  CBC with Differential/Platelet     Status: Abnormal    Collection Time: 08/31/22  5:46 AM  Result Value Ref Range    WBC 8.8 4.0 - 10.5 K/uL    RBC 2.63 (L) 3.87 - 5.11 MIL/uL    Hemoglobin 8.6 (L) 12.0 - 15.0 g/dL    HCT 27.1 (L) 36.0 - 46.0 %    MCV 103.0 (H) 80.0 - 100.0 fL    MCH 32.7 26.0 - 34.0 pg    MCHC 31.7 30.0 - 36.0 g/dL    RDW 20.1 (H) 11.5 - 15.5 %    Platelets 588 (H) 150 - 400 K/uL    nRBC 0.0 0.0 - 0.2 %    Neutrophils Relative % 71 %    Neutro Abs 6.3 1.7 - 7.7 K/uL     Lymphocytes Relative 15 %    Lymphs Abs 1.3 0.7 - 4.0 K/uL    Monocytes Relative 11 %    Monocytes Absolute 1.0 0.1 - 1.0 K/uL    Eosinophils Relative 1 %    Eosinophils Absolute 0.1 0.0 - 0.5 K/uL    Basophils Relative 1 %    Basophils Absolute 0.1 0.0 -  0.1 K/uL    Immature Granulocytes 1 %    Abs Immature Granulocytes 0.11 (H) 0.00 - 0.07 K/uL  Comprehensive metabolic panel     Status: Abnormal    Collection Time: 08/31/22  5:46 AM  Result Value Ref Range    Sodium 130 (L) 135 - 145 mmol/L    Potassium 3.7 3.5 - 5.1 mmol/L    Chloride 94 (L) 98 - 111 mmol/L    CO2 26 22 - 32 mmol/L    Glucose, Bld 109 (H) 70 - 99 mg/dL    BUN 9 8 - 23 mg/dL    Creatinine, Ser <0.30 (L) 0.44 - 1.00 mg/dL    Calcium 8.1 (L) 8.9 - 10.3 mg/dL    Total Protein 5.0 (L) 6.5 - 8.1 g/dL    Albumin 2.0 (L) 3.5 - 5.0 g/dL    AST 21 15 - 41 U/L    ALT 13 0 - 44 U/L    Alkaline Phosphatase 60 38 - 126 U/L    Total Bilirubin 0.5 0.3 - 1.2 mg/dL    GFR, Estimated NOT CALCULATED >60 mL/min    Anion gap 10 5 - 15  Magnesium     Status: None    Collection Time: 08/31/22  5:46 AM  Result Value Ref Range    Magnesium 1.8 1.7 - 2.4 mg/dL  Phosphorus     Status: None    Collection Time: 08/31/22  5:46 AM  Result Value Ref Range    Phosphorus 4.4 2.5 - 4.6 mg/dL  Glucose, capillary     Status: Abnormal    Collection Time: 08/31/22  8:09 AM  Result Value Ref Range    Glucose-Capillary 129 (H) 70 - 99 mg/dL       Imaging Results (Last 48 hours)  DG Swallowing Func-Speech Pathology   Result Date: 08/30/2022 Table formatting from the original result was not included. Modified Barium Swallow Study Patient Details Name: CHASIDI WEBB MRN: [id number] Date of Birth: 1956-04-20 Today's Date: 08/30/2022 HPI/PMH: HPI: Patient is a 67 y/o female who presents on 1/22 for revision of complex lumbar wound, removal of left L1-L5 lumbar screws and rod, removal of right L1 screw and revision of L1-L5 posterior  instrumented fusion. Current admission has been complicated by encephalopathy which initially seemed 2/2 polypharmacy, then thought possible UTI, and Cefadroxil was added 1/26.  CNS depressing medications were also de-escalated.  Despite these measures, Pt become increasingly encephalopathic. 2/1: Admitted to ICU and intubated. 2/2: MRI brain concerning for meningitis. 2/6: Pt was extubated although re-intubated due to fatigue and airway protection; trached. PMH includes GERD, HH, DM, HTN, anxiety, left TSA, arthritis. Esophagram in 2021 revealed previous Nissen fundoplication as well as moderate to severe esophageal dysmotility. Clinical Impression: Pt demonstrates swallow within functional limits for age with trace to mild differences that do not impact safety with PO. Pt does have cortrak in place. Also has PMSV in place for exam. Differences in swallow function include initiation of hyoid burst past the ramus of the mandible with most thin and nectar trials. Pt has an instance of trace frank penetration of thin while taking pill due to late and slightly incomplete epiglottic deflection. There is trace residue at times in the base of tongue and vallecular region that clear with subsequent swallows. Pill also lodged in vallecular region as well as in the mid esophageal column. Recommend pt consume regular textured solids with thin liquids with PMSV in place, but take pills whole with puree. Noted  trace collection of barium in tissue anterior to the esophagus on image 49 (44/246) and in other trials of liquids. Will report to radiologist for futher review Factors that may increase risk of adverse event in presence of aspiration ([place] 2021): Factors that may increase risk of adverse event in presence of aspiration ([place] 2021): Limited mobility; Frail or deconditioned; Dependence for feeding and/or oral hygiene; Presence of tubes (ETT, trach, NG, etc.) Recommendations/Plan: Swallowing  Evaluation Recommendations Swallowing Evaluation Recommendations Recommendations: PO diet PO Diet Recommendation: Regular; Thin liquids (Level 0) Liquid Administration via: Cup; Straw Medication Administration: Whole meds with puree Supervision: Patient able to self-feed Swallowing strategies  : Place PMSV during PO intake; Minimize environmental distractions; Slow rate; Small bites/sips Postural changes: Position pt fully upright for meals; Stay upright 30-60 min after meals Oral care recommendations: Oral care BID (2x/day) Treatment Plan Treatment Plan Treatment recommendations: Therapy as outlined in treatment plan below Follow-up recommendations: Acute inpatient rehab (3 hours/day) Functional status assessment: Patient has had a recent decline in their functional status and demonstrates the ability to make significant improvements in function in a reasonable and predictable amount of time. Treatment frequency: Min 2x/week Treatment duration: 1 week Interventions: Aspiration precaution training; Patient/family education; Diet toleration management by SLP Recommendations Recommendations for follow up therapy are one component of a multi-disciplinary discharge planning process, led by the attending physician.  Recommendations may be updated based on patient status, additional functional criteria and insurance authorization. Assessment: Orofacial Exam: Orofacial Exam Oral Cavity: Oral Hygiene: WFL Oral Cavity - Dentition: Adequate natural dentition Orofacial Anatomy: WFL Oral Motor/Sensory Function: WFL Anatomy: Anatomy: WFL Thin Liquids: Thin Liquids (Level 0) Thin Liquids : WFL Bolus delivery method: Cup Initiation of swallow : Posterior laryngeal surface of the epiglottis  Mildly Thick Liquids: Mildly thick liquids (Level 2, nectar thick) Mildly thick liquids (Level 2, nectar thick): Impaired Bolus delivery method: Cup Mildly Thick Liquid - Impairment: Oral Impairment; Pharyngeal impairment Lip Closure: Escape  from interlabial space or lateral juncture, no extension beyond vermillion border Tongue control during bolus hold: Cohesive bolus between tongue to palatal seal Bolus transport/lingual motion: Brisk tongue motion Oral residue: Trace residue lining oral structures Location of oral residue : Tongue; Palate Initiation of swallow : Valleculae Soft palate elevation: Complete Laryngeal elevation: Complete superior movement of thyroid cartilage with complete approximation of arytenoids to epiglottic petiole Anterior hyoid excursion: Complete Epiglottic movement: Partial Laryngeal vestibule closure: Complete: No air/contrast in laryngeal vestibule Pharyngeal stripping wave : Present - complete Pharyngeal contraction (A/P view only): Complete Pharyngoesophageal segment opening: Complete distension and complete duration, no obstruction of flow Tongue base retraction: No contrast between tongue base and posterior pharyngeal wall (PPW) Pharyngeal residue: Trace residue within or on pharyngeal structures Location of pharyngeal residue: Valleculae; Pyriform sinuses Penetration/Aspiration Scale (PAS) score: 1.  Material does not enter airway  Moderately Thick Liquids: Moderately thick liquids (Level 3, honey thick) Moderately thick liquids (Level 3, honey thick): Impaired Bolus delivery method: Spoon Moderately Thick Liquid - Impairment: Pharyngeal impairment; Oral Impairment Lip Closure: Interlabial escape, no progression to anterior lip Tongue control during bolus hold: Cohesive bolus between tongue to palatal seal Bolus transport/lingual motion: Brisk tongue motion Oral residue: Complete oral clearance Location of oral residue : N/A Initiation of swallow : Posterior angle of the ramus Soft palate elevation: Complete Laryngeal elevation: Complete superior movement of thyroid cartilage with complete approximation of arytenoids to epiglottic petiole Anterior hyoid excursion: Complete Epiglottic movement: Partial Laryngeal  vestibule closure:  Complete: No air/contrast in laryngeal vestibule Pharyngeal stripping wave : Present - complete Pharyngeal contraction (A/P view only): N/A Pharyngoesophageal segment opening: Complete distension and complete duration, no obstruction of flow Tongue base retraction: Trace column of contrast or air between tongue base and PPW Pharyngeal residue: Trace residue within or on pharyngeal structures Location of pharyngeal residue: Valleculae; Tongue base Penetration/Aspiration Scale (PAS) score: 1.  Material does not enter airway  Puree: Puree Puree: Impaired Puree - Impairment: Pharyngeal impairment Initiation of swallow: Posterior angle of the ramus Soft palate elevation: Complete Laryngeal elevation: Complete superior movement of thyroid cartilage with complete approximation of arytenoids to epiglottic petiole Anterior hyoid excursion: Complete Epiglottic movement: Partial Laryngeal vestibule closure: Complete: No air/contrast in laryngeal vestibule Pharyngeal stripping wave : Present - complete Pharyngeal contraction (A/P view only): Complete Pharyngoesophageal segment opening: Complete distension and complete duration, no obstruction of flow Tongue base retraction: Trace column of contrast or air between tongue base and PPW Pharyngeal residue: Trace residue within or on pharyngeal structures Location of pharyngeal residue: Tongue base; Valleculae Penetration/Aspiration Scale (PAS) score: 1.  Material does not enter airway Solid: Solid Solid: Impaired Solid - Impairment: Oral Impairment; Pharyngeal impairment Lip Closure: No labial escape Bolus preparation/mastication: Timely and efficient chewing and mashing Bolus transport/lingual motion: Brisk tongue motion Oral residue: Trace residue lining oral structures Location of oral residue : Tongue; Palate Initiation of swallow: Posterior angle of the ramus Soft palate elevation: Complete Laryngeal elevation: Complete superior movement of thyroid cartilage  with complete approximation of arytenoids to epiglottic petiole Anterior hyoid excursion: Complete Epiglottic movement: Partial Laryngeal vestibule closure: Complete: No air/contrast in laryngeal vestibule Pharyngeal stripping wave : Present - complete Pharyngoesophageal segment opening: Complete distension and complete duration, no obstruction of flow Tongue base retraction: Narrow column of contrast or air between tongue base and PPW Pharyngeal residue: Collection of residue within or on pharyngeal structures Location of pharyngeal residue: Valleculae; Tongue base Penetration/Aspiration Scale (PAS) score: 1.  Material does not enter airway Pill: Pill Pill: Impaired Consistency administered : thin Pill - Impairment: Oral Impairment; Pharyngeal impairment Lip Closure: Escape from interlabial space or lateral juncture, no extension beyond vermillion border Bolus transport/lingual motion: Brisk tongue motion Oral residue: Complete oral clearance Initiation of swallow : Pyriform sinuses Soft palate elevation: Complete Laryngeal elevation: Complete superior movement of thyroid cartilage with complete approximation of arytenoids to epiglottic petiole Anterior hyoid excursion: Complete Epiglottic movement: Partial Laryngeal vestibule closure: Complete: No air/contrast in laryngeal vestibule Pharyngeal stripping wave : Present - complete Pharyngeal contraction (A/P view only): N/A Pharyngoesophageal segment opening: Complete distension and complete duration, no obstruction of flow Tongue base retraction: Wide column of contrast or air between tongue base and PPW Pharyngeal residue: Majority of contrast within or on pharyngeal structures Location of pharyngeal residue: Valleculae; Pyriform sinuses Penetration/Aspiration Scale (PAS) score: 5.  Material enters airway, CONTACTS cords and not ejected out Compensatory Strategies: Compensatory Strategies Compensatory strategies: No   General Information: Caregiver present: Yes  Investment banker, corporate)  Diet Prior to this Study: NPO; Cortrak/Small bore NG tube   Temperature : Normal   Respiratory Status: WFL   Supplemental O2: Trach Collar   No data recorded Behavior/Cognition: Alert; Distractible Self-Feeding Abilities: Dependent for feeding Baseline vocal quality/speech: Normal Volitional Cough: Able to elicit Volitional Swallow: Able to elicit No data recorded Goal Planning: Prognosis for improved oropharyngeal function: Good Barriers to Reach Goals: Cognitive deficits No data recorded Patient/Family Stated Goal: patient wants a soda Consulted and agree with results and recommendations: Patient;  Nurse Pain: Pain Assessment Pain Assessment: Faces Pain Score: 10 Faces Pain Scale: 4 Breathing: 0 Negative Vocalization: 0 Facial Expression: 0 Body Language: 0 Consolability: 0 PAINAD Score: 0 Facial Expression: 0 Body Movements: 0 Muscle Tension: 0 Compliance with ventilator (intubated pts.): N/A Vocalization (extubated pts.): 0 CPOT Total: 0 Pain Location: back with mobility Pain Descriptors / Indicators: Grimacing; Guarding; Discomfort Pain Intervention(s): Limited activity within patient's tolerance; Monitored during session; Repositioned End of Session: Start Time:SLP Start Time (ACUTE ONLY): 1224 Stop Time: SLP Stop Time (ACUTE ONLY): 1246 Time Calculation:SLP Time Calculation (min) (ACUTE ONLY): 22 min Charges: SLP Evaluations $ SLP Speech Visit: 1 Visit SLP Evaluations $BSS Swallow: 1 Procedure $MBS Swallow: 1 Procedure $ SLP Eval Voice Prosthetic Device: Procedure $$ Passy Muir Speaking Valve: yes $Speech Treatment for Individual: 1 Procedure SLP visit diagnosis: SLP Visit Diagnosis: Dysphagia, unspecified (R13.10) Past Medical History: Past Medical History: Diagnosis Date  Anemia   Anxiety   Arthritis   Depression   GERD (gastroesophageal reflux disease)   Heart murmur   "related to VSD"  High cholesterol   History of blood transfusion   "related to OR" (08/19/2016)  History of hiatal hernia    Hypertension   Hyperthyroidism   Mild cognitive impairment 09/06/2018  Paroxysmal ventricular tachycardia (HCC)   Type II diabetes mellitus (HCC)   UTI (urinary tract infection)   being treated with Keflex  Ventricular septal defect  Past Surgical History: Past Surgical History: Procedure Laterality Date  ABDOMINAL HYSTERECTOMY    BACK SURGERY    CARDIAC CATHETERIZATION N/A 06/23/2015  Procedure: Left Heart Cath and Coronary Angiography;  Surgeon: Larey Dresser, MD;  Location: [facility];  Service: Cardiovascular;  Laterality: N/A;  CARDIAC CATHETERIZATION  1960  "VSD was so small; didn't need repaired"  EXAM UNDER ANESTHESIA WITH MANIPULATION OF HIP Right 06/02/2014  dr Rhona Raider  FRACTURE SURGERY    HERNIA REPAIR    HIP CLOSED REDUCTION Right 06/02/2014  Procedure: CLOSED MANIPULATION HIP;  Surgeon: Hessie Dibble, MD;  Location: [facility];  Service: Orthopedics;  Laterality: Right;  JOINT REPLACEMENT    JOINT REPLACEMENT    POSTERIOR LUMBAR FUSION 4 LEVEL N/A 06/26/2022  Procedure: Lumbar One To Lumbar Five Posterior Instrumented Fusion;  Surgeon: Judith Part, MD;  Location: [facility];  Service: Neurosurgery;  Laterality: N/A;  REFRACTIVE SURGERY Bilateral   SHOULDER ARTHROSCOPY Right   SHOULDER OPEN ROTATOR CUFF REPAIR Right   SPINAL FUSION  1996  "t10 down to my coccyx  SPINE HARDWARE REMOVAL    TOTAL ABDOMINAL HYSTERECTOMY    TOTAL HIP ARTHROPLASTY Right 05/10/2014  [facility]      by dr Harrell Gave olcott  TOTAL KNEE ARTHROPLASTY Left   TOTAL SHOULDER ARTHROPLASTY Left 08/19/2016  Procedure: TOTAL SHOULDER ARTHROPLASTY;  Surgeon: Tania Ade, MD;  Location: [facility];  Service: Orthopedics;  Laterality: Left;  Left total shoulder replacement DeBlois, Katherene Ponto 08/30/2022, 1:44 PM      Assessment/Plan: Diagnosis: Recent revision of lumbar spine wound/surgical site complicated by osteomyelitis/infection and meningitis/ventriculitis. Pt also deconditioned.   I spoke with 2 daughters at  length today. Pt with a history of multiple surgeries including prior back surgery and multiple joint replacements. Yet, she was still able to ambulate household and shorter community distances, drove, and was independent prior to December. Family plans on bringing her home and providing care needed at discharge. They understand that SNF is not really an option if we admit her to inpatient rehab.  Does the need for close, 24 hr/day medical supervision in concert with the patient's rehab needs make it unreasonable for this patient to be served in a less intensive setting? Yes Co-Morbidities requiring supervision/potential complications: wound care, ID considerations, trach mgt, nutrition, pain control Due to bladder management, bowel management, safety, skin/wound care, disease management, medication administration, pain management, and patient education, does the patient require 24 hr/day rehab nursing? Yes Does the patient require coordinated care of a physician, rehab nurse, therapy disciplines of PT, OT, SLP to address physical and functional deficits in the context of the above medical diagnosis(es)? Yes Addressing deficits in the following areas: balance, endurance, locomotion, strength, transferring, bowel/bladder control, bathing, dressing, feeding, grooming, toileting, cognition, speech, swallowing, and psychosocial support Can the patient actively participate in an intensive therapy program of at least 3 hrs of therapy per day at least 5 days per week? Yes and Potentially The potential for patient to make measurable gains while on inpatient rehab is excellent Anticipated functional outcomes upon discharge from inpatient rehab are supervision and min assist  with PT, supervision and min assist with OT, modified independent and supervision with SLP. Estimated rehab length of stay to reach the above functional goals is: 20-27 days Anticipated discharge destination: Home [place]  Rehab/Functional Prognosis: good   POST ACUTE RECOMMENDATIONS: This patient's condition is appropriate for continued rehabilitative care in the following setting:  CIR (see below) Patient has agreed to participate in recommended program. Yes Note that insurance prior authorization may be required for reimbursement for recommended care.   Comment: Pt is not quite ready yet from a tolerance or medical standpoint for inpatient rehab. She needs ongoing, daily therapy on acute to improve activity tolerance which will effectively help her "ramp up" to that needed to participate in inpatient rehab.       MEDICAL RECOMMENDATIONS: Consider changing q8 sq heparin to lovenox 53m qd for DVT prophylaxis.     I have personally performed a face to face diagnostic evaluation of this patient. Additionally, I have examined the patient's medical record including any pertinent labs and radiographic images. If the physician assistant has documented in this note, I have reviewed and edited or otherwise concur with the physician assistant's documentation.    Thanks,   ZMeredith Staggers MD 08/31/2022      Revision History  Routing History

## 2022-09-03 NOTE — Progress Notes (Signed)
Inpatient Rehabilitation Admission Medication Review by a Pharmacist  A complete drug regimen review was completed for this patient to identify any potential clinically significant medication issues.  High Risk Drug Classes Is patient taking? Indication by Medication  Antipsychotic Yes Prochlorperazine - nausea  Anticoagulant Yes Enoxaparin - VTE prophylaxis  Antibiotic Yes, as an intravenous medication Cefepime, vancomycin, metronidazole - meningitis  Opioid Yes Buprenorphine patch, oxycodone IR - pain  Antiplatelet No   Hypoglycemics/insulin Yes Aspart - DM  Vasoactive Medication Yes, as an intravenous medication Carvedilol, labetalol prn - HTN  Chemotherapy No   Other Yes Cyclobenzaprine - spasms Donepezil - mild cognitive impairment Famotidine - reflux Fluvoxamine, trazodone, gabapentin, lamotrigine - bipolar, sleep Levothyroxine - hypothyroidism MVI, Fibercon, KCl, Florastor, NaCl - supplementation  Tamsulosin - urinary retention     Type of Medication Issue Identified Description of Issue Recommendation(s)  Drug Interaction(s) (clinically significant)     Duplicate Therapy     Allergy     No Medication Administration End Date     Incorrect Dose     Additional Drug Therapy Needed     Significant med changes from prior encounter (inform family/care partners about these prior to discharge). PTA medications: Tramadol, semaglutide, rosuvastatin, quetiapine, pantoprazole, naproxen, nicotine, metformin, losartan Consider resuming PTA medications during CIR admit or upon discharge as appropriate    Other       Clinically significant medication issues were identified that warrant physician communication and completion of prescribed/recommended actions by midnight of the next day:  No  Name of provider notified for urgent issues identified:   Provider Method of Notification:    Pharmacist comments: none  Time spent performing this drug regimen review (minutes):   20  Thank you for involving pharmacy in this patient's care.  Renold Genta, PharmD, BCPS Clinical Pharmacist Clinical phone for 09/03/2022 is [phone] 09/03/2022 6:55 PM

## 2022-09-03 NOTE — Progress Notes (Signed)
Inpatient Rehabilitation Admissions Coordinator   I have insurance approval and CIR bed to admit today. I contacted Alli, PA, Dr Renne Crigler and both daughters. All in agreement to admit to CIR today. I have alerted acute team and TOC and will make the arrangements to admit today.  Danne Baxter, RN, MSN Rehab Admissions Coordinator [phone] 09/03/2022 1:52 PM

## 2022-09-03 NOTE — Discharge Summary (Signed)
Discharge Summary  Date of Admission: 08/09/2022  Date of Discharge: 09/03/22  Attending Physician: Emelda Brothers, MD  Hospital Course: Patient was admitted 1/22 for exposed hardware and wound breakdown after prior repair of a 3 column fracture of prior scoliosis construct. She was taken to the OR on 1/22 for revision of her wound, removal of the left sided hardware, as well as the right L1 screw and revision of the posterolateral fusion. Post-operatively, she unfortunately had a long complicated course. She required transfer to the ICU for respiratory failure with concern for aspiration, presumed due to altered mental status, CT PE was negative, CTH neg, CT L-spine was unremarkable, MRI brain did show some bilateral occipital horn diffusion changes concerning for possible ventriculitis. Her wound did have some drainage and continue dslow healing, wound care was consulted and a wound vac was placed. She was intubated, CT PE did show likely aspiration pneumonia, she was started on broad spectrum antibiotics. Co-management was performed with the assistance of CCM and medicine while in the unit and floor, respectively. She failed extubation and a bedside trach was performed, was able to be weaned to a speaking valve and passed a swallow eval. She continued to progress well after trach and was able to transfer out of the unit. She was seen by PT/OT, who recommended CIR and was accepted for transfer CIR on 2/16.   Neurologic exam at discharge:  TC with speaking valve, interactive, +poor short term memory but improving, PERRL, gaze conjugate, Fcx4 with deconditioning, wound vac in place with minimal output   Discharge diagnoses: Pathologic fracture of the lumbar spine, revision of exposed hardware, respiratory failure  Judith Part, MD 09/03/22 1:52 PM

## 2022-09-03 NOTE — Progress Notes (Signed)
Inpatient Rehabilitation Admissions Coordinator   I have begun Auth with [facility] [employer] to pursue approval to admit to CIR. I await their determination.  Danne Baxter, RN, MSN Rehab Admissions Coordinator [phone] 09/03/2022 12:12 PM

## 2022-09-03 NOTE — Progress Notes (Addendum)
PROGRESS NOTE  [name] [id number] DOB: 08-17-1955 DOA: 08/09/2022 PCP: [name], MD   LOS: 25 days   Brief Narrative / Interim history: 67 year old female with history of scoliosis with prior lumbar fusion, mild memory problems, recurrent UTIs who was admitted 1/22 for complex lumbar wound and visible lumbar spine hardware. Has been admitted twice recently prior to current admission -- first for eval of back pain found to be unstable fracture at prior lumbar fusion site requiring PLIF L1-L5 on 12/9. Discharged to [facility] 12/21. At SNF, wound dehisced and pt presented back to ED same day and was readmitted 12/21-12/29 (second admission), then dc back to SNF. Her complex lumbar wound further deteriorated at SNF and she presented back to ED 1/22 with exposed lumbar hardware and was admitted (current admission). She went to OR 1/22 for revision of L1-L5 fusion, removal of left L1-L5 screws and rod, and removal of right L1 screw, revision of L1-L5 posterior instrumented fusion.  Hospital course complicated by encephalopathy and sepsis requiring ICU transfer, respiratory failure requiring tracheostomy.  Eventually improved and was transferred back to the hospitalist service on 2/15  Significant events: 12/9 - PLIF for unstable L fracture through prior L spine fusion. Admitted 12/9-12/21, dc to SNF 12/21 12/21 - Readmitted 12/21-12/29 for wound dehiscence and dc back to SNF 1/22 - Readmitted for exposed L hardware; OR for removal of left L1-L5 screws, rod and right L1 screw. 1/22 - Started doxycycline. 1/23 - Encephalopathic 1/25 - Started ciprofloxacin 1/26 - Stopped ciprofloxacin, started cefadroxil for possible UTI  1/30 - New leukocytosis, ongoing confusion, fever; TRH consult 1/31 - Worse fever and confusion; abx change to vanc and zosyn 2/1 - Dyspnea, tachycardia. WBC 17.5.  [name] and ID consults. Abx changed to vanc, cefepime, and metronidazole. Admitted to ICU.  Hypoxemic,  intubated.  CTA chest concerning for aspiration PNA. 2/2 - MR brain concerning for meningitis, continue empiric treatment w/ vanc and cefepime 2/6 tracheostomy [place]/RB 2/13-repeat MRI of the brain unchanged with concern for ventriculitis 2/15-transferred to the hospitalist service  Subjective / 24h Interval events: Continues to do well.  Daughters are at bedside.  She reports occasional nausea, especially after the oral potassium.  Has been starting to eat but lacks an appetite.  No chest pain, no shortness of breath.  Assesement and Plan: Principal Problem:   Wound dehiscence Active Problems:   Encephalopathy acute   Aspiration pneumonia of both lungs (HCC)   Acute respiratory failure with hypoxia (HCC)   Sepsis (HCC)   Seizure (HCC)   Malnutrition of moderate degree   Bacterial meningitis   Hardware complicating wound infection ([place])   Infection of deep incisional surgical site after procedure  Recommendations for discharge from medical standpoint -Continue iv antibiotics as below with vancomycin, cefepime, metronidazole until 09/30/2022.  Please ensure that patient has a follow-up appointment with infectious disease before antibiotics are discontinued -Continue to assess oral nutrition and remove NG tube when she is able to meet her body needs with eating -Continue trach care, downsize as indicated, and possible decannulation -Continue to wean off buprenorphine -She was taken off home Seroquel in the ICU, keep off on discharge -Her Coreg dose was increased and Norvasc and losartan discontinued -Needs outpatient follow-up with urology for urodynamic studies upon discharge from CIR.  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 [name] on 1/22.  Infectious disease were consulted and followed patient while hospitalized.  Given no positive cultures she has been placed on broad-spectrum antibiotics with vancomycin, cefepime as well as metronidazole.  Per ID, IV treatment end date will be for total of 6 weeks with the end date being on 09/30/2022.  Close to that time, will need to be determined whether she needs to switch to p.o. antibiotics for suppressive purposes.  She will have repeat MRI in 4 weeks per Dr. [name], and follow-up in ID clinic before finishing intravenous antibiotics  Active problems Chronic respiratory failure, s/p trach - S/p tracheostomy 2/06, appreciate [name] follow-up.  She is doing well with PMV, eating  Hyponatremia with hypochloremia - continue salt tabs.  Sodium overall stable  Back pain - I see that she was started on buprenorphine in the ICU, please wean this off in rehab if possible  Acute encephalopathy /ICU induced delirium -appears significantly improved.  Still off at times per daughters but much better than when she was in the ICU.  Continue to encourage mobilization, and continue to progress medically   Hypokalemia -continue to replete, magnesium normal but 1.7, goal 2.0   Anemia of critical illness -Follow intermittently, repeat CBC today   DM type 2 -CBG + SSI   CBG (last 3)  Recent Labs    09/02/22 1952 09/02/22 2332 09/03/22 0743  GLUCAP 112* 113* 129*     Chronic combined CHF, CAD, HTN -recent 2D echo done 08/20/2022 showed apical hypokinesis, LVEF 40-45%, RV function was reduced.  Continue Coreg.  Intermittently diuresed, most recent furosemide 2/11.  Slight lower extremity swelling today, repeat Lasix today   Urine retention -Foley in place.  Apparently she has tried couple of voiding trials in the past and failed having to had the Foley reinserted.  I  spoke with Dr. Milford Cage with urology, best next step is for the patient to have urodynamic studies but they can only be done as an outpatient.  Continue bethanechol, can also try tamsulosin, start today   Hx of Bipolar, mild cognitive deficit -Plan to continue: aricept, neurontin, lamitcal and trazadone   Hx of hypothyroidism -Continue levothyroxine.  Most recent TSH 2/1 was 1.0.   Moderate malnutrition -continue tube feeds but now switched to nocturnal.  Encourage oral intake during daytime.  RD following, may need calorie count this weekend.  She is starting to eat more and more   Debility -Working with PT/OT/SLP.  CIR is evaluating  Anemia of chronic disease -continue to watch, no bleeding.  Anemia panel looks fair   Scheduled Meds:  bethanechol  10 mg Per Tube TID   buprenorphine  1 patch Transdermal Weekly   carvedilol  12.5 mg Per Tube BID WC   Chlorhexidine Gluconate Cloth  6 each Topical Daily   cyclobenzaprine  5 mg Per Tube TID   donepezil  10 mg Per Tube Daily   famotidine  20 mg Per Tube BID   feeding supplement  237 mL Oral TID BM   feeding supplement (GLUCERNA 1.5 CAL)  1,000 mL Per Tube Q24H   fluvoxaMINE  100 mg Per Tube QHS   furosemide  40 mg Intravenous Once   gabapentin  100 mg Per Tube Q8H   [name]'s butt cream   Topical Daily   heparin  5,000 Units Subcutaneous Q8H   insulin aspart  0-15 Units Subcutaneous Q4H   lamoTRIgine  200 mg Per Tube Daily   levothyroxine  88 mcg Per Tube QAC breakfast   multivitamin with minerals  1 tablet Per Tube Daily   mouth rinse  15 mL Mouth Rinse 4 times per day   sodium chloride flush  10-40 mL Intracatheter Q12H   sodium chloride flush  3 mL Intravenous Q12H   sodium chloride  1 g Per Tube BID WC   traZODone  100 mg Per Tube QHS   Continuous Infusions:  sodium chloride Stopped (08/29/22 2159)   ceFEPime (MAXIPIME) IV Stopped (09/03/22 0909)   metronidazole 100 mL/hr at 09/03/22 1054   vancomycin Stopped (09/03/22 0319)    PRN Meds:.sodium chloride, acetaminophen **OR** acetaminophen, diphenoxylate-atropine, fentaNYL (SUBLIMAZE) injection, ipratropium-albuterol, labetalol, melatonin, menthol-cetylpyridinium **OR** [DISCONTINUED] phenol, ondansetron **OR** ondansetron (ZOFRAN) IV, mouth rinse, mouth rinse, oxyCODONE, sodium chloride flush, sodium chloride flush  Current Outpatient Medications  Medication Instructions   acetaminophen (TYLENOL) 1,000 mg, Oral, 3 times daily, (0800, 1400 & 2000)   amLODipine (NORVASC) 5 mg, Oral, Every morning, (0730)   buPROPion (WELLBUTRIN XL) 300 mg, Oral, Every morning,  (0730)   carvedilol (COREG) 6.25 mg, Oral, 2 times daily, (0730 & 2000)   cyclobenzaprine (FLEXERIL) 5 mg, Oral, 3 times daily, (0800, 1300 & 2000)   donepezil (ARICEPT) 10 mg, Oral, Daily   fluvoxaMINE (LUVOX) 100 mg, Oral, Daily at bedtime   gabapentin (NEURONTIN) 200 mg, Oral, 3 times daily, (0800, 1400 & 2000)   lamoTRIgine (LAMICTAL) 200 mg, Oral, Daily   levothyroxine (SYNTHROID) 88 mcg, Oral, Daily before breakfast   loperamide (IMODIUM) 2-4 mg, Oral, See admin instructions, Take 1 capsule (2 mg) by mouth once daily scheduled & take 1-2 capsules (2-4 mg) by mouth once daily as needed for additional loose stools.   losartan (COZAAR) 100 mg, Oral, Daily at bedtime   metFORMIN (GLUCOPHAGE) 500 mg, Oral, 2 times daily   Multiple Vitamin (MULTIVITAMIN) tablet 1 tablet, Oral, Daily,     nicotine polacrilex (NICORETTE) 2 mg, Oral, Daily   ondansetron (ZOFRAN) 4 mg, Oral, Every 6 hours PRN   Ozempic (1 MG/DOSE) 2 mg, Subcutaneous, Every Mon   Ozempic (2 MG/DOSE) 2 mg, Subcutaneous, Every Mon   pantoprazole (PROTONIX) 80 mg, Oral, Daily at bedtime   QUEtiapine (SEROQUEL) 600 mg, Oral, Daily at bedtime,     sodium chloride 1 g, Oral, Every other day, (0900)   traMADol (ULTRAM) 25 mg, Oral, Every 6 hours PRN   traZODone (DESYREL) 100 mg, Oral, Daily at bedtime    Diet Orders (From admission, onward)      Start     Ordered   08/30/22 1301  Diet regular Fluid consistency: Thin  Diet effective now       Comments: Meds whole in puree, always wear purple valve with oral intake  Question:  Fluid consistency:  Answer:  Thin   08/30/22 1300            DVT prophylaxis: heparin injection 5,000 Units Start: 08/24/22 2200 Place TED hose Start: 08/11/22 0927 SCD's Start: 08/09/22 1705   Lab Results  Component Value Date   PLT 403 (H) 09/03/2022      Code Status: Full Code  Family Communication: 2 daughters present at bedside  Status is: Inpatient  Remains inpatient appropriate because: severity of illness  Level of care: Progressive  Objective: Vitals:   09/02/22 2357 09/03/22 0400 09/03/22 0723 09/03/22  0822  BP: 135/72 139/76 139/80   Pulse:   84 84  Resp:   13 18  Temp: 98.2 F (36.8 C) 98.2 F (36.8 C) 97.8 F (36.6 C)   TempSrc: Oral Oral Oral   SpO2:   94% 98%  Weight:      Height:        Intake/Output Summary (Last 24 hours) at 09/03/2022 1114 Last data filed at 09/03/2022 1054 Gross per 24 hour  Intake 4595.89 ml  Output 1500 ml  Net 3095.89 ml    Wt Readings from Last 3 Encounters:  06/26/22 72.6 kg  04/15/22 75.5 kg  02/10/22 75.3 kg    Examination:  Constitutional: NAD Eyes: lids and conjunctivae normal, no scleral icterus ENMT: mmm Neck: normal, supple Respiratory: clear to auscultation bilaterally, no wheezing, no crackles. Normal respiratory effort.  Cardiovascular: Regular rate and rhythm, no murmurs / rubs / gallops. 1+  LE edema. Abdomen: soft, no distention, no tenderness. Bowel sounds positive.    Data Reviewed: I have independently reviewed following labs and imaging studies   CBC Recent Labs  Lab 08/28/22 0450 08/29/22 0355 08/30/22 0358 08/31/22 0546 09/02/22 1030 09/03/22 0405  WBC 10.4 9.3 8.6 8.8 8.9 7.8  HGB 8.9* 8.8* 8.5* 8.6* 9.3* 8.4*  HCT 27.6* 26.4* 25.9* 27.1* 28.1* 25.3*  PLT 646* 548* 500* 588* 441* 403*  MCV  100.4* 99.2 102.0* 103.0* 101.1* 100.4*  MCH 32.4 33.1 33.5 32.7 33.5 33.3  MCHC 32.2 33.3 32.8 31.7 33.1 33.2  RDW 19.9* 19.9* 20.5* 20.1* 20.1* 20.4*  LYMPHSABS 1.5  --   --  1.3  --   --   MONOABS 1.0  --   --  1.0  --   --   EOSABS 0.0  --   --  0.1  --   --   BASOSABS 0.1  --   --  0.1  --   --      Recent Labs  Lab 08/28/22 0450 08/29/22 0355 08/30/22 0358 08/31/22 0546 09/02/22 1030 09/03/22 0405  NA 130* 125* 132* 130* 130* 130*  K 3.8 3.4* 3.7 3.7 3.4* 3.2*  CL 91* 87* 98 94* 93* 94*  CO2 29 28 27 26 28 26  $ GLUCOSE 130* 325* 124* 109* 109* 137*  BUN 7* 9 7* 9 7* 7*  CREATININE <0.30* <0.30* <0.30* <0.30* 0.34* <0.30*  CALCIUM 7.9* 7.7* 7.6* 8.1* 8.3* 7.9*  AST  --   --   --  21 22 19  $ ALT  --   --   --  13 12 10  $ ALKPHOS  --   --   --  60 61 60  BILITOT  --   --   --  0.5 0.5 0.2*  ALBUMIN  --   --   --  2.0* 2.1* 1.9*  MG 2.0  --  1.7 1.8 1.7 1.7     ------------------------------------------------------------------------------------------------------------------ No results for input(s): "CHOL", "HDL", "LDLCALC", "TRIG", "CHOLHDL", "LDLDIRECT" in the last 72 hours.  Lab Results  Component Value Date   HGBA1C 6.0 (H) 07/08/2022   ------------------------------------------------------------------------------------------------------------------ No results for input(s): "TSH", "T4TOTAL", "T3FREE", "THYROIDAB" in the last 72 hours.  Invalid input(s): "FREET3"  Cardiac Enzymes No results for input(s): "CKMB", "TROPONINI", "MYOGLOBIN" in the last 168 hours.  Invalid input(s): "CK" ------------------------------------------------------------------------------------------------------------------    Component Value Date/Time   BNP 1,213.8 (H) 08/19/2022 1515    CBG: Recent Labs  Lab 09/01/22 1517 09/02/22 0523 09/02/22 1952 09/02/22 2332 09/03/22 [id number]  GLUCAP 135* 131* 112* 113* 129*     Recent Results (from the past 240 hour(s))  Culture,  Respiratory w Gram Stain     Status: None   Collection Time: 08/24/22  2:55 PM   Specimen: Bronchoalveolar Lavage; Respiratory  Result Value Ref Range Status   Specimen Description BRONCHIAL ALVEOLAR LAVAGE  Final   Special Requests NONE  Final   Gram Stain   Final    ABUNDANT WBC PRESENT, PREDOMINANTLY PMN NO ORGANISMS SEEN Performed at [facility], [address] [place], [place] [address]    Culture   Final    RARE CANDIDA ALBICANS FUNGUS (MOLD) ISOLATED, PROBABLE CONTAMINANT/COLONIZER (SAPROPHYTE). CONTACT MICROBIOLOGY IF FURTHER IDENTIFICATION REQUIRED [phone].    Report Status 08/27/2022 FINAL  Final     Radiology Studies: No results found.   Marzetta Board, MD, PhD [facility]  Between 7 am - 7 pm I am available, please contact me via Amion (for emergencies) or Securechat (non urgent messages)  Between 7 pm - 7 am I am not available, please contact night coverage MD/APP via Amion

## 2022-09-03 NOTE — Progress Notes (Signed)
Neurosurgery Service Progress Note  Subjective: NAE ON, no respiratory issues, family at bedside - short term memory / mood are both improving slowly, wound vac changed yesterday with good improvement in appearance  Objective: Vitals:   09/03/22 0723 09/03/22 0822 09/03/22 1122 09/03/22 1146  BP: 139/80  (!) 143/90   Pulse: 84 84 85 84  Resp: 13 18 16 14  $ Temp: 97.8 F (36.6 C)  98.8 F (37.1 C)   TempSrc: Oral  Oral   SpO2: 94% 98% 98% 98%  Weight:      Height:        Physical Exam: TC with speaking valve, interactive, +poor short term memory but improving, PERRL, gaze conjugate, Fcx4 with deconditioning, wound vac in place with minimal output  Assessment & Plan: 67 y.o. female s/p revision of complex lumbar wound, removal of left L1-L5 lumbar screws and rod, removal of right L1 screw, revision of L1-L5 posterior instrumented fusion. Complicated post-operative course   -medicine recs -CIR pending   Judith Part, MD

## 2022-09-03 NOTE — Progress Notes (Signed)
Pharmacy Antibiotic Note   [name] is a 67 y.o. 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 Pharmacy has been consulted for Vancomycin + Cefepime dosing. She is transferred to CIR today. Below is the plan from inpatient admission.    Last vancomycin trough therapeutic at 16 mcg/mL.  Renal function remains stable.  Afebrile, WBC WNL.   Plan: Continue vanc 129m IV Q12H Continue Cefepime 2g IV Q8H and Flagyl 5045mIV Q8H Monitor renal function, culture results, weekly vanc trough  Thank you for involving pharmacy in this patient's care.  JeRenold GentaPharmD, BCPS Clinical Pharmacist Clinical phone for 09/03/2022 is [phone] 09/03/2022 6:57 PM

## 2022-09-03 NOTE — Progress Notes (Signed)
Pt discharge education and instructions completed. Report called off to nurse Levada Dy in [place] and pt transported off unit via bed with family and belongings to the side to [id number]. P. Angelica Pou RN

## 2022-09-03 NOTE — Plan of Care (Signed)
Problem: Education: Goal: Ability to verbalize activity precautions or restrictions will improve 09/03/2022 0507 by Charletta Cousin, RN Outcome: Progressing 09/02/2022 2019 by Charletta Cousin, RN Outcome: Progressing Goal: Knowledge of the prescribed therapeutic regimen will improve Outcome: Progressing Goal: Understanding of discharge needs will improve Outcome: Progressing   Problem: Activity: Goal: Ability to avoid complications of mobility impairment will improve Outcome: Progressing Goal: Ability to tolerate increased activity will improve 09/03/2022 0507 by Charletta Cousin, RN Outcome: Progressing 09/02/2022 2019 by Charletta Cousin, RN Outcome: Progressing Goal: Will remain free from falls 09/03/2022 0507 by Charletta Cousin, RN Outcome: Progressing 09/02/2022 2019 by Charletta Cousin, RN Outcome: Progressing   Problem: Bowel/Gastric: Goal: Gastrointestinal status for postoperative course will improve 09/03/2022 0507 by Charletta Cousin, RN Outcome: Progressing 09/02/2022 2019 by Charletta Cousin, RN Outcome: Progressing   Problem: Clinical Measurements: Goal: Ability to maintain clinical measurements within normal limits will improve Outcome: Progressing Goal: Postoperative complications will be avoided or minimized Outcome: Progressing Goal: Diagnostic test results will improve Outcome: Progressing   Problem: Pain Management: Goal: Pain level will decrease 09/03/2022 0507 by Charletta Cousin, RN Outcome: Progressing 09/02/2022 2019 by Charletta Cousin, RN Outcome: Progressing   Problem: Skin Integrity: Goal: Will show signs of wound healing Outcome: Progressing   Problem: Health Behavior/Discharge Planning: Goal: Identification of resources available to assist in meeting health care needs will improve Outcome: Progressing   Problem: Bladder/Genitourinary: Goal: Urinary functional status for postoperative course will improve 09/03/2022 0507 by Charletta Cousin,  RN Outcome: Progressing 09/02/2022 2019 by Charletta Cousin, RN Outcome: Progressing   Problem: Education: Goal: Knowledge of General Education information will improve Description: Including pain rating scale, medication(s)/side effects and non-pharmacologic comfort measures 09/03/2022 0507 by Charletta Cousin, RN Outcome: Progressing 09/02/2022 2019 by Charletta Cousin, RN Outcome: Progressing   Problem: Health Behavior/Discharge Planning: Goal: Ability to manage health-related needs will improve Outcome: Progressing   Problem: Clinical Measurements: Goal: Ability to maintain clinical measurements within normal limits will improve 09/03/2022 0507 by Charletta Cousin, RN Outcome: Progressing 09/02/2022 2019 by Charletta Cousin, RN Outcome: Progressing Goal: Will remain free from infection Outcome: Progressing Goal: Diagnostic test results will improve Outcome: Progressing Goal: Respiratory complications will improve Outcome: Progressing Goal: Cardiovascular complication will be avoided Outcome: Progressing   Problem: Activity: Goal: Risk for activity intolerance will decrease 09/03/2022 0507 by Charletta Cousin, RN Outcome: Progressing 09/02/2022 2019 by Charletta Cousin, RN Outcome: Progressing   Problem: Nutrition: Goal: Adequate nutrition will be maintained 09/03/2022 0507 by Charletta Cousin, RN Outcome: Progressing 09/02/2022 2019 by Charletta Cousin, RN Outcome: Progressing   Problem: Coping: Goal: Level of anxiety will decrease Outcome: Progressing   Problem: Elimination: Goal: Will not experience complications related to bowel motility 09/03/2022 0507 by Charletta Cousin, RN Outcome: Progressing 09/02/2022 2019 by Charletta Cousin, RN Outcome: Progressing Goal: Will not experience complications related to urinary retention 09/03/2022 0507 by Charletta Cousin, RN Outcome: Progressing 09/02/2022 2019 by Charletta Cousin, RN Outcome: Progressing   Problem: Pain  Managment: Goal: General experience of comfort will improve 09/03/2022 0507 by Charletta Cousin, RN Outcome: Progressing 09/02/2022 2019 by Charletta Cousin, RN Outcome: Progressing   Problem: Safety: Goal: Ability to remain free from injury will improve 09/03/2022 0507 by Charletta Cousin, RN Outcome: Progressing 09/02/2022 2019 by Charletta Cousin, RN Outcome: Progressing   Problem: Skin Integrity: Goal: Risk for impaired skin integrity  will decrease Outcome: Progressing   Problem: Education: Goal: Ability to describe self-care measures that may prevent or decrease complications (Diabetes Survival Skills Education) will improve Outcome: Progressing Goal: Individualized Educational Video(s) Outcome: Progressing   Problem: Coping: Goal: Ability to adjust to condition or change in health will improve Outcome: Progressing   Problem: Fluid Volume: Goal: Ability to maintain a balanced intake and output will improve Outcome: Progressing   Problem: Health Behavior/Discharge Planning: Goal: Ability to identify and utilize available resources and services will improve Outcome: Progressing Goal: Ability to manage health-related needs will improve Outcome: Progressing   Problem: Metabolic: Goal: Ability to maintain appropriate glucose levels will improve Outcome: Progressing   Problem: Nutritional: Goal: Maintenance of adequate nutrition will improve Outcome: Progressing Goal: Progress toward achieving an optimal weight will improve Outcome: Progressing   Problem: Skin Integrity: Goal: Risk for impaired skin integrity will decrease Outcome: Progressing   Problem: Tissue Perfusion: Goal: Adequacy of tissue perfusion will improve Outcome: Progressing   Problem: Education: Goal: Knowledge about tracheostomy care/management will improve Outcome: Progressing   Problem: Activity: Goal: Ability to tolerate increased activity will improve 09/03/2022 0507 by Charletta Cousin,  RN Outcome: Completed/Met 09/02/2022 2019 by Charletta Cousin, RN Outcome: Progressing   Problem: Respiratory: Goal: Ability to maintain a clear airway and adequate ventilation will improve 09/03/2022 0507 by Charletta Cousin, RN Outcome: Completed/Met 09/02/2022 2019 by Charletta Cousin, RN Outcome: Progressing   Problem: Role Relationship: Goal: Method of communication will improve 09/03/2022 0507 by Charletta Cousin, RN Outcome: Completed/Met 09/02/2022 2019 by Charletta Cousin, RN Outcome: Progressing   Problem: Safety: Goal: Non-violent Restraint(s) 09/03/2022 0507 by Charletta Cousin, RN Outcome: Completed/Met 09/02/2022 2019 by Charletta Cousin, RN Outcome: Progressing

## 2022-09-03 NOTE — Progress Notes (Deleted)
[name], MD  Physician Physical Medicine and Rehabilitation   PMR Pre-admission    Signed   Date of Service: 09/02/2022  4:29 PM  Related encounter: Admission (Current) from 08/09/2022 in [place]   Signed      Show:Clear all [x]$ Written[x]$ Templated[x]$ Copied  Added by: [x]$ Cristina Gong, RN  []$ Hover for details MR Admission Coordinator Pre-Admission Assessment   Patient: TANINA RAKOCZY is an 67 y.o., female MRN: [id number] DOB: 1956/04/04 Height: 5' (152.4 cm) Weight:  (Bedscale not working)                                                                                                                                                  Forensic scientist HMO:     PPO:      PCP:      IPA:      80/20:      OTHER:  PRIMARYOren Binet Medicare      Policy#: [id number]      Subscriber: pt CM Name: Larene Beach      Phone#: [phone]     Fax#: [phone] Pre-Cert#: [id number]   approved for 7 days   Employer:  Benefits:  Phone #: [phone]     Name: 2/15 Eff. Date: 07/19/22     Deduct: none      Out of Pocket Max: $3150      Life Max: none  CIR: $335 co pay per day days 1 until 5      SNF: no co pay per day days 1 until 20; $203 co pay per day days 21 until 60; no co pay per day days 61 until 100 Outpatient: $10 per visit     Co-Pay: visits per medical necessity Home Health: 100%      Co-Pay: none DME: 80%     Co-Pay: 20% Providers: in network  SECONDARY: none   Financial Counselor:       Phone#:    The Engineer, [employer]" for patients in Inpatient Rehabilitation Facilities with attached "Privacy Act [place] Records" was provided and verbally reviewed with: Patient and Family   Emergency Contact Information Contact Information       Name Relation Home Work Mobile    Mcmahan,Cooper Spouse [phone] [place] Daughter     [phone]    [name] Daughter      [phone]         Current Medical History  Patient Admitting Diagnosis: Revision Lumbar spine wound   History of Present Illness:  67 y.o. female with a history of L1 L5 PLIF on 06/26/22 for unstable fracture through prior lumbar fusion who was readmitted on 07/08/22 for wound dehiscence. She was discharged to SNF after both admissions. Mrs Savell  was admitted again on August 09, 2018 for with wound dehiscence and  exposed hardware with unstable lumbar spine fracture at the surgical site.    The patient underwent revision of a complex lumbar wound with removal of left L1, L2, L3, L4, and L5 lumbar screws and rod, removal of right L1 screw, revision of L1-L5 posterior instrumented fusion by Dr. Marcello Moores O's regard.  Patient was noted to be lethargic postoperatively and was diagnosed with a UTI treated with cefadrox.  Pain medication was decreased also.  Patient was also hyponatremic.  Patient was seen by [facility] nurse for placement of VAC on 08/18/2022.  The same day patient was found to be tachypneic and there was concern for early sepsis and pulmonary edema.  Hospitalist were consulted to follow patient.  Abx were started empirically for sepsis.  No obvious source of infection was identified and infectious disease was consulted.  Critical care was also consulted for increased confusion and worsening shortness of breath.  CTA of the chest demonstrated multifocal infiltrates concerning for pneumonia due to aspiration.  Patient was ultimately intubated.  CT of the lumbar spine was unremarkable for acute changes.  MRI of the brain showed a punctate focus of ischemia in the right frontal lobe as well as some changes superior horn of the left lateral ventricle concerning for proteinaceous, purulent debris.  Flagyl was added for aspiration pneumonia as well as CNS coverage.  Blood cultures were negative.  Extubation was attempted on 08/23/2022 but patient failed and had to be reintubated and tracheostomy was placed  the same day.  Patient is currently on vancomycin, cefepime, and Flagyl with the plan of 6 weeks of total antibiotic coverage.     Patient's medical record from [facility] has been reviewed by the rehabilitation admission coordinator and physician.   Past Medical History      Past Medical History:  Diagnosis Date   Anemia     Anxiety     Arthritis     Depression     GERD (gastroesophageal reflux disease)     Heart murmur      "related to VSD"   High cholesterol     History of blood transfusion      "related to OR" (08/19/2016)   History of hiatal hernia     Hypertension     Hyperthyroidism     Mild cognitive impairment 09/06/2018   Paroxysmal ventricular tachycardia (HCC)     Type II diabetes mellitus (HCC)     UTI (urinary tract infection)      being treated with Keflex   Ventricular septal defect      Has the patient had major surgery during 100 days prior to admission? Yes   Family History  family history includes Chorea in her maternal grandfather; Dementia in her father, maternal aunt, and maternal aunt; Heart attack in an other family member; Other in her mother; Stroke (age of onset: 78) in her father.   Current Medications    Current Facility-Administered Medications:    0.9 %  sodium chloride infusion, , Intravenous, PRN, Chesley Mires, MD, Stopped at 08/29/22 2159   acetaminophen (TYLENOL) tablet 650 mg, 650 mg, Per Tube, Q6H PRN, 650 mg at 09/02/22 0551 **OR** acetaminophen (TYLENOL) suppository 650 mg, 650 mg, Rectal, Q6H PRN, Judith Part, MD   bethanechol (URECHOLINE) tablet 10 mg, 10 mg, Per Tube, TID, Jacky Kindle, MD, 10 mg at 09/03/22 0840   buprenorphine (BUTRANS) 5 MCG/HR 1 patch, 1 patch, Transdermal, Weekly, Judith Part, MD, 1 patch at 08/31/22 2104   carvedilol (  COREG) tablet 12.5 mg, 12.5 mg, Per Tube, BID WC, Chand, Sudham, MD, 12.5 mg at 09/03/22 0631   ceFEPIme (MAXIPIME) 2 g in sodium chloride 0.9 % 100 mL IVPB, 2 g,  Intravenous, Q8H, Paytes, Austin A, RPH, Stopped at 09/03/22 [id number]   Chlorhexidine Gluconate Cloth 2 % PADS 6 each, 6 each, Topical, Daily, Oswald Hillock, MD, 6 each at 09/03/22 0841   cyclobenzaprine (FLEXERIL) tablet 5 mg, 5 mg, Per Tube, TID, Jacky Kindle, MD, 5 mg at 09/03/22 0840   diphenoxylate-atropine (LOMOTIL) 2.5-0.025 MG per tablet 1-2 tablet, 1-2 tablet, Oral, QID PRN, Oswald Hillock, MD, 2 tablet at 08/15/22 1756   donepezil (ARICEPT) tablet 10 mg, 10 mg, Per Tube, Daily, Ventura Sellers, RPH, 10 mg at 09/03/22 0840   famotidine (PEPCID) tablet 20 mg, 20 mg, Per Tube, BID, Bowser, Laurel Dimmer, NP, 20 mg at 09/03/22 0840   feeding supplement (ENSURE ENLIVE / ENSURE PLUS) liquid 237 mL, 237 mL, Oral, TID BM, Judith Part, MD, 237 mL at 09/03/22 0842   feeding supplement (GLUCERNA 1.5 CAL) liquid 1,000 mL, 1,000 mL, Per Tube, Q24H, Caren Griffins, MD, Infusion Verify at 09/03/22 1054   fentaNYL (SUBLIMAZE) injection 25-50 mcg, 25-50 mcg, Intravenous, Q6H PRN, Judith Part, MD, 50 mcg at 09/03/22 0810   fluvoxaMINE (LUVOX) tablet 100 mg, 100 mg, Per Tube, QHS, Chand, Sudham, MD, 100 mg at 09/02/22 2150   gabapentin (NEURONTIN) 250 MG/5ML solution 100 mg, 100 mg, Per Tube, Q8H, Chand, Sudham, MD, 100 mg at 09/03/22 0631   [name]'s butt cream, , Topical, Daily, Jacky Kindle, MD, Given at 09/03/22 0900   heparin injection 5,000 Units, 5,000 Units, Subcutaneous, Q8H, Chand, Sudham, MD, 5,000 Units at 09/03/22 0631   insulin aspart (novoLOG) injection 0-15 Units, 0-15 Units, Subcutaneous, Q4H, Chand, Sudham, MD, 2 Units at 09/03/22 0842   ipratropium-albuterol (DUONEB) 0.5-2.5 (3) MG/3ML nebulizer solution 3 mL, 3 mL, Nebulization, Q6H PRN, Judith Part, MD   labetalol (NORMODYNE) injection 10 mg, 10 mg, Intravenous, Q2H PRN, Jacky Kindle, MD, 10 mg at 08/30/22 1804   lamoTRIgine (LAMICTAL) tablet 200 mg, 200 mg, Per Tube, Daily, Ventura Sellers, RPH, 200 mg at 09/03/22  0840   levothyroxine (SYNTHROID) tablet 88 mcg, 88 mcg, Per Tube, QAC breakfast, Ventura Sellers, RPH, 88 mcg at 09/03/22 0631   melatonin tablet 5 mg, 5 mg, Per Tube, QHS PRN, Kamineni, Neelima, MD   menthol-cetylpyridinium (CEPACOL) lozenge 3 mg, 1 lozenge, Oral, PRN **OR** [DISCONTINUED] phenol (CHLORASEPTIC) mouth spray 1 spray, 1 spray, Mouth/Throat, PRN, Darrick Meigs, Marge Duncans, MD   metroNIDAZOLE (FLAGYL) IVPB 500 mg, 500 mg, Intravenous, Q8H, Ostergard, Joyice Faster, MD, Last Rate: 100 mL/hr at 09/03/22 1054, Infusion Verify at 09/03/22 1054   multivitamin with minerals tablet 1 tablet, 1 tablet, Per Tube, Daily, Ventura Sellers, RPH, 1 tablet at 09/03/22 0840   ondansetron (ZOFRAN) tablet 4 mg, 4 mg, Oral, Q6H PRN **OR** ondansetron (ZOFRAN) injection 4 mg, 4 mg, Intravenous, Q6H PRN, Darrick Meigs, Gagan S, MD, 4 mg at 09/03/22 [id number]   Oral care mouth rinse, 15 mL, Mouth Rinse, PRN, Bowser, Laurel Dimmer, NP   Oral care mouth rinse, 15 mL, Mouth Rinse, 4 times per day, Collene Schlichter, PA-C, 15 mL at 09/03/22 1145   Oral care mouth rinse, 15 mL, Mouth Rinse, PRN, Cosentino, Allison R, PA-C   oxyCODONE (Oxy IR/ROXICODONE) immediate release tablet 7.5 mg, 7.5 mg, Per Tube, Q4H PRN, Jacky Kindle, MD, 7.5  mg at 09/03/22 0203   sodium chloride flush (NS) 0.9 % injection 10-40 mL, 10-40 mL, Intracatheter, Q12H, Ostergard, Joyice Faster, MD, 10 mL at 09/03/22 0843   sodium chloride flush (NS) 0.9 % injection 10-40 mL, 10-40 mL, Intracatheter, PRN, Judith Part, MD   sodium chloride flush (NS) 0.9 % injection 3 mL, 3 mL, Intravenous, Q12H, Darrick Meigs, Gagan S, MD, 3 mL at 09/01/22 1200   sodium chloride flush (NS) 0.9 % injection 3 mL, 3 mL, Intravenous, PRN, Darrick Meigs, Marge Duncans, MD   sodium chloride tablet 1 g, 1 g, Per Tube, BID WC, Ventura Sellers, RPH, 1 g at 09/03/22 0840   tamsulosin (FLOMAX) capsule 0.4 mg, 0.4 mg, Oral, QPC supper, Gherghe, Costin M, MD   traZODone (DESYREL) tablet 100 mg, 100 mg, Per Tube, QHS,  Chand, Sudham, MD, 100 mg at 09/02/22 2004   vancomycin (VANCOREADY) IVPB 1250 mg/250 mL, 1,250 mg, Intravenous, Q12H, Ostergard, Joyice Faster, MD, Last Rate: 166.7 mL/hr at 09/03/22 1144, 1,250 mg at 09/03/22 1144   Patients Current Diet:  Diet Order                  Diet - low sodium heart healthy             Diet regular Fluid consistency: Thin  Diet effective now                       Precautions / Restrictions Precautions Precautions: Fall, Back Precaution Booklet Issued: Yes (comment) Precaution Comments: trach, cortrack, flexiseal, wound vac on back, using PMSV Other Brace: prevlon boots Restrictions Weight Bearing Restrictions: No Other Position/Activity Restrictions: external rotation R hip at baseline, pt ambulated household distances PTA, w/c for community    Has the patient had 2 or more falls or a fall with injury in the past year?No   Prior Activity Level Limited Community (1-2x/wk): Mod I with RW in home, used wheelchair for long disctances, drove self   Prior Functional Level Prior Function Prior Level of Function : Needs assist  Cognitive Assist : Mobility (cognitive), ADLs (cognitive) Mobility (Cognitive): Intermittent cues ADLs (Cognitive): Intermittent cues Physical Assist : Mobility (physical), ADLs (physical) Mobility (physical): Bed mobility, Transfers ADLs (physical): Grooming, Bathing, Dressing, IADLs, Toileting, Feeding Mobility Comments: to return home with family 24/7 min asisst ADLs Comments: needed assist with all BADL tasks   Self Care: Did the patient need help bathing, dressing, using the toilet or eating?  Needed some help   Indoor Mobility: Did the patient need assistance with walking from room to room (with or without device)? Independent   Stairs: Did the patient need assistance with internal or external stairs (with or without device)? Independent   Functional Cognition: Did the patient need help planning regular tasks such as  shopping or remembering to take medications? Independent   Patient Information Are you of Hispanic, Latino/a,or Spanish origin?: A. No, not of Hispanic, Latino/a, or Spanish origin What is your race?: A. White Do you need or want an interpreter to communicate with a doctor or health care staff?: 0. No   Patient's Response To:  Health Literacy and Transportation Is the patient able to respond to health literacy and transportation needs?: Yes Health Literacy - How often do you need to have someone help you when you read instructions, pamphlets, or other written material from your doctor or pharmacy?: Never In the past 12 months, has lack of transportation kept you from medical appointments or from  getting medications?: No In the past 12 months, has lack of transportation kept you from meetings, work, or from getting things needed for daily living?: No   Development worker, international aid / [place] Devices/Equipment: Wheelchair, Shower chair with back   Prior Device Use: Indicate devices/aids used by the patient prior to current illness, exacerbation or injury? Walker short distances, wheelchair in the community   Current Functional Level Cognition   Arousal/Alertness: Awake/alert Overall Cognitive Status: Impaired/Different from baseline Difficult to assess due to: Tracheostomy Current Attention Level: Sustained Orientation Level: Oriented X4 (forgetful) Following Commands: Follows one step commands with increased time (and multimodal cues) Safety/Judgement: Decreased awareness of safety, Decreased awareness of deficits General Comments: frustrated and perseverating on the fact that she was trying to visit with her sister when PT arrived. Attention: Sustained, Selective Sustained Attention: Impaired Sustained Attention Impairment: Functional complex, Verbal basic Selective Attention: Impaired Selective Attention Impairment: Verbal complex, Functional complex Memory:  Impaired Memory Impairment: Retrieval deficit Awareness: Appears intact Problem Solving: Appears intact Safety/Judgment: Appears intact    Extremity Assessment (includes Sensation/Coordination)   Upper Extremity Assessment: RUE deficits/detail, LUE deficits/detail RUE Deficits / Details: improved AROM today but limited by weakness, AAROM for increased range and functional use required RUE Coordination: decreased gross motor, decreased fine motor LUE Deficits / Details: mild edema noted, improved AROM today but limited by weakness, AAROM for increased range and functional use required LUE Coordination: decreased fine motor, decreased gross motor  Lower Extremity Assessment: Defer to PT evaluation RLE Deficits / Details: Overall weakness, difficulty performing LAQ in seated position against gravity, grossly ~2/5 knee ex/knee flexion. LLE Deficits / Details: Overall weakness, difficulty performing LAQ in seated position against gravity, able to activtae quads minimally.     ADLs   Overall ADL's : Needs assistance/impaired Eating/Feeding: NPO Eating/Feeding Details (indicate cue type and reason): Declined proper set-up of bed for adequate positioning to eat. Only acceptable of the HOB at ~30-40 degrees. Grooming: Moderate assistance, Oral care Grooming Details (indicate cue type and reason): suction oral care with RUE, required setup and proximal support with cueing for task attention Upper Body Bathing: Total assistance, Bed level Lower Body Bathing: Total assistance, Bed level Upper Body Dressing : Total assistance, Bed level Lower Body Dressing: Total assistance, +2 for physical assistance, +2 for safety/equipment, Sit to/from stand Toilet Transfer: +2 for physical assistance, +2 for safety/equipment, Maximal assistance Toilet Transfer Details (indicate cue type and reason): using stedy Toileting- Clothing Manipulation and Hygiene: Bed level, Total assistance Functional mobility during  ADLs: Maximal assistance, +2 for physical assistance, +2 for safety/equipment General ADL Comments: pt easily distracted, completed transfer so she could visit with her son     Mobility   Overal bed mobility: Needs Assistance Bed Mobility: Rolling, Sidelying to Sit Rolling: Mod assist, +2 for safety/equipment Sidelying to sit: +2 for physical assistance, Max assist Supine to sit: Max assist, +2 for physical assistance Sit to supine: +2 for physical assistance, Max assist Sit to sidelying: Max assist, +2 for safety/equipment General bed mobility comments: using rail and bit to help assist for legs off bed and to lift trunk     Transfers   Overall transfer level: Needs assistance Equipment used: Ambulation equipment used Transfers: Bed to chair/wheelchair/BSC Sit to Stand: Max assist, +2 physical assistance, +2 safety/equipment Bed to/from chair/wheelchair/BSC transfer type:: Squat pivot Stand pivot transfers: Max assist, +2 safety/equipment, +2 physical assistance Squat pivot transfers: +2 physical assistance, Max assist  Lateral/Scoot Transfers: Total assist, +2 physical  assistance Transfer via Lift Equipment: [employer] transfer comment: up on L leg mainly for pivot to recliner with armrest lowered and using bed pad to pivot due to skin issued on her back and gait belt not used     Ambulation / Gait / Stairs / Proofreader / Balance Dynamic Sitting Balance Sitting balance - Comments: sitting with S about 20 seconds then assisted to prevent L lateral LOB Balance Overall balance assessment: Needs assistance Sitting-balance support: Feet supported Sitting balance-Leahy Scale: Poor Sitting balance - Comments: sitting with S about 20 seconds then assisted to prevent L lateral LOB Postural control: Left lateral lean Standing balance support: Bilateral upper extremity supported Standing balance-Leahy Scale: Zero Standing balance comment: NT     Special  needs/care consideration 2/6 # 6 cuffed trach 28 % trach collar 2/2 10 fr cortrak feeding tube left nare  2/10 14 fr urethral catheter [facility] Nurse wound follow up 2/16 Wound type:lumbar wound, surgical dehiscence Measurement:not obtained today Wound bed: granulating, edges contracting well  Photo in chart.  Improved. Slough remains at proximal and distal end.  Drainage (amount, consistency, odor) minimal seroaanguinous  no odor Periwound: intact  continues with loose stool, fecal management system in place and no leakage noted today.  Gerhardts to buttocks and incontinence associated skin breakdown Dressing procedure/placement/frequency:barrier ring to proximal and distal end to promote seal.  1 piece black foam. Covered with drape.  Change Mon.WEd.Fri.  Patient is pleasant and talkative this AM.  Family at bedside.  They observe the wound and are pleased with progress. Will follow.  Estrellita Ludwig MSN, RN, FNP-BC CWON Wound, Ostomy, Continence Nurse [facility] [phone] Pager [phone]     Previous Home Environment  Living Arrangements: Spouse/significant other  Lives With: Spouse Available Help at Discharge: Family, Available 24 hours/day (2 daughters, son , spouse and hired Programmer, multimedia for 24/7) Type of Home: [place] Name: [place]: One level Home Access: Ramped entrance Bathroom Shower/Tub: Multimedia programmer: Handicapped height [place] Accessibility: Yes How Accessible: Accessible via Combine: No Additional Comments: Was Mod I prior to admit in 12/23 with RW short distances and wheelchair long disctances   Discharge Living Setting Plans for Discharge Living Setting: Patient's home, Lives with (comment) (spouse) Type of Home at Discharge: House Discharge Home Layout: One level Discharge Home Access: [place] entrance Discharge Bathroom Shower/Tub: Walk-in shower Discharge Bathroom Toilet: Handicapped  height Discharge Bathroom Accessibility: Yes How Accessible: Accessible via walker Does the patient have any problems obtaining your medications?: No   Social/Family/Support Systems Patient Roles: Spouse, Parent Contact Information: spouse, duaghters and son Anticipated Caregiver: spouse, 2 daughters, son and hired caregivers Anticipated Ambulance person Information: see contacts Ability/Limitations of Caregiver: family work, but will arrange schedule and hire assist Caregiver Availability: 24/7 Discharge Plan Discussed with Primary Caregiver: Yes Is Caregiver In Agreement with Plan?: Yes Does Caregiver/Family have Issues with Lodging/Transportation while Pt is in Rehab?: No   Goals Patient/Family Goal for Rehab: supervision to min with PT and OT, mod I to supervision with SLP Expected length of stay: ELOS 20 to 27 days Pt/Family Agrees to Admission and willing to participate: Yes Program Orientation Provided & Reviewed with Pt/Caregiver Including Roles  & Responsibilities: Yes   Has [employer] ticket for June in [place] and her goal is  to be ambulatory and recovered to go.    Decrease burden of Care through IP rehab  admission: n/a   Possible need for SNF placement upon discharge:was at [facility] after last 2 admits since 12/23 and family feel that she did not have enuugh medical and nursing oversight of her complex needs.   Patient Condition: This patient's medical and functional status has changed since the consult dated: 08/31/22 in which the Rehabilitation Physician determined and documented that the patient's condition is appropriate for intensive rehabilitative care in an inpatient rehabilitation facility. See "History of Present Illness" (above) for medical update. Functional changes are: max assist overall. Patient's medical and functional status update has been discussed with the Rehabilitation physician and patient remains appropriate for inpatient rehabilitation. Will  admit to inpatient rehab today.   Preadmission Screen Completed By:  Cleatrice Burke, RN, 09/03/2022 1:58 PM ______________________________________________________________________   Discussed status with Dr. Naaman Plummer on 09/03/22 at 1400 and received approval for admission today.   Admission Coordinator:  Cleatrice Burke, time 1400 Date 09/03/22            Revision History

## 2022-09-03 NOTE — Consult Note (Signed)
[place] Nurse wound follow up Wound type:lumbar wound, surgical dehiscence Measurement:not obtained today Wound bed: granulating, edges contracting well  Photo in chart.  Improved. Slough remains at proximal and distal end.  Drainage (amount, consistency, odor) minimal seroaanguinous  no odor Periwound: intact  continues with loose stool, fecal management system in place and no leakage noted today.  Gerhardts to buttocks and incontinence associated skin breakdown Dressing procedure/placement/frequency:barrier ring to proximal and distal end to promote seal.  1 piece black foam. Covered with drape.  Change Mon.WEd.Fri.  Patient is pleasant and talkative this AM.  Family at bedside.  They observe the wound and are pleased with progress. Will follow.  Estrellita Ludwig MSN, RN, FNP-BC CWON Wound, Ostomy, Continence Nurse [facility] [phone] Pager [phone]

## 2022-09-04 DIAGNOSIS — R600 Localized edema: Secondary | ICD-10-CM

## 2022-09-04 DIAGNOSIS — E871 Hypo-osmolality and hyponatremia: Secondary | ICD-10-CM | POA: Diagnosis not present

## 2022-09-04 DIAGNOSIS — R197 Diarrhea, unspecified: Secondary | ICD-10-CM | POA: Diagnosis not present

## 2022-09-04 DIAGNOSIS — M4626 Osteomyelitis of vertebra, lumbar region: Secondary | ICD-10-CM | POA: Diagnosis not present

## 2022-09-04 LAB — COMPREHENSIVE METABOLIC PANEL
ALT: 12 U/L (ref 0–44)
AST: 17 U/L (ref 15–41)
Albumin: 2 g/dL — ABNORMAL LOW (ref 3.5–5.0)
Alkaline Phosphatase: 51 U/L (ref 38–126)
Anion gap: 8 (ref 5–15)
BUN: 5 mg/dL — ABNORMAL LOW (ref 8–23)
CO2: 26 mmol/L (ref 22–32)
Calcium: 8.3 mg/dL — ABNORMAL LOW (ref 8.9–10.3)
Chloride: 95 mmol/L — ABNORMAL LOW (ref 98–111)
Creatinine, Ser: <0.3 mg/dL — ABNORMAL LOW (ref 0.44–1.00)
Glucose, Bld: 96 mg/dL (ref 70–99)
Potassium: 4 mmol/L (ref 3.5–5.1)
Sodium: 129 mmol/L — ABNORMAL LOW (ref 135–145)
Total Bilirubin: 0.4 mg/dL (ref 0.3–1.2)
Total Protein: 4.9 g/dL — ABNORMAL LOW (ref 6.5–8.1)

## 2022-09-04 LAB — CBC WITH DIFFERENTIAL/PLATELET
Abs Immature Granulocytes: 0.04 10*3/uL (ref 0.00–0.07)
Basophils Absolute: 0.1 10*3/uL (ref 0.0–0.1)
Basophils Relative: 1 %
Eosinophils Absolute: 0.1 10*3/uL (ref 0.0–0.5)
Eosinophils Relative: 1 %
HCT: 25.8 % — ABNORMAL LOW (ref 36.0–46.0)
Hemoglobin: 8.6 g/dL — ABNORMAL LOW (ref 12.0–15.0)
Immature Granulocytes: 1 %
Lymphocytes Relative: 20 %
Lymphs Abs: 1.4 10*3/uL (ref 0.7–4.0)
MCH: 33.7 pg (ref 26.0–34.0)
MCHC: 33.3 g/dL (ref 30.0–36.0)
MCV: 101.2 fL — ABNORMAL HIGH (ref 80.0–100.0)
Monocytes Absolute: 0.9 10*3/uL (ref 0.1–1.0)
Monocytes Relative: 12 %
Neutro Abs: 4.7 10*3/uL (ref 1.7–7.7)
Neutrophils Relative %: 65 %
Platelets: 359 10*3/uL (ref 150–400)
RBC: 2.55 MIL/uL — ABNORMAL LOW (ref 3.87–5.11)
RDW: 20.1 % — ABNORMAL HIGH (ref 11.5–15.5)
WBC: 7.3 10*3/uL (ref 4.0–10.5)
nRBC: 0 % (ref 0.0–0.2)

## 2022-09-04 LAB — GLUCOSE, CAPILLARY
Glucose-Capillary: 93 mg/dL (ref 70–99)
Glucose-Capillary: 124 mg/dL — ABNORMAL HIGH (ref 70–99)
Glucose-Capillary: 159 mg/dL — ABNORMAL HIGH (ref 70–99)
Glucose-Capillary: 105 mg/dL — ABNORMAL HIGH (ref 70–99)
Glucose-Capillary: 122 mg/dL — ABNORMAL HIGH (ref 70–99)

## 2022-09-04 LAB — MAGNESIUM: Magnesium: 1.7 mg/dL (ref 1.7–2.4)

## 2022-09-04 MED ORDER — SODIUM BICARBONATE 650 MG PO TABS
650.0000 mg | ORAL_TABLET | Freq: Once | ORAL | Status: AC
  Administered 2022-09-04: 650 mg
  Filled 2022-09-04 (×2): qty 1

## 2022-09-04 MED ORDER — CYCLOBENZAPRINE HCL 5 MG PO TABS
7.5000 mg | ORAL_TABLET | Freq: Three times a day (TID) | ORAL | Status: DC
  Administered 2022-09-04 – 2022-09-06 (×6): 7.5 mg
  Filled 2022-09-04 (×7): qty 2

## 2022-09-04 MED ORDER — INSULIN ASPART 100 UNIT/ML IJ SOLN
0.0000 [IU] | Freq: Three times a day (TID) | INTRAMUSCULAR | Status: DC
  Administered 2022-09-05 (×2): 2 [IU] via SUBCUTANEOUS
  Administered 2022-09-06: 3 [IU] via SUBCUTANEOUS
  Administered 2022-09-08 – 2022-09-10 (×4): 2 [IU] via SUBCUTANEOUS
  Administered 2022-09-11: 3 [IU] via SUBCUTANEOUS
  Administered 2022-09-12: 2 [IU] via SUBCUTANEOUS
  Administered 2022-09-12: 3 [IU] via SUBCUTANEOUS
  Administered 2022-09-13 (×2): 2 [IU] via SUBCUTANEOUS
  Administered 2022-09-14: 5 [IU] via SUBCUTANEOUS
  Administered 2022-09-15 – 2022-09-18 (×8): 2 [IU] via SUBCUTANEOUS
  Administered 2022-09-19: 3 [IU] via SUBCUTANEOUS
  Administered 2022-09-19 (×2): 2 [IU] via SUBCUTANEOUS
  Administered 2022-09-20 – 2022-09-21 (×2): 3 [IU] via SUBCUTANEOUS
  Administered 2022-09-21 – 2022-09-23 (×4): 2 [IU] via SUBCUTANEOUS
  Administered 2022-09-23 – 2022-09-24 (×4): 3 [IU] via SUBCUTANEOUS
  Administered 2022-09-25: 1 [IU] via SUBCUTANEOUS
  Administered 2022-09-25: 2 [IU] via SUBCUTANEOUS
  Administered 2022-09-26: 3 [IU] via SUBCUTANEOUS
  Administered 2022-09-26 – 2022-09-27 (×4): 2 [IU] via SUBCUTANEOUS
  Administered 2022-09-28 (×2): 3 [IU] via SUBCUTANEOUS
  Administered 2022-09-28: 2 [IU] via SUBCUTANEOUS

## 2022-09-04 MED ORDER — INSULIN ASPART 100 UNIT/ML IJ SOLN: 0.0000 [IU] | Freq: Every day | INTRAMUSCULAR | Status: DC

## 2022-09-04 MED ORDER — GABAPENTIN 100 MG PO CAPS
100.0000 mg | ORAL_CAPSULE | Freq: Three times a day (TID) | ORAL | Status: DC
  Filled 2022-09-04: qty 1

## 2022-09-04 MED ORDER — FUROSEMIDE 20 MG PO TABS
20.0000 mg | ORAL_TABLET | Freq: Every day | ORAL | Status: DC
  Administered 2022-09-04 – 2022-09-06 (×3): 20 mg
  Filled 2022-09-04 (×3): qty 1

## 2022-09-04 MED ORDER — SODIUM CHLORIDE 0.9 % IV SOLN: Freq: Once | INTRAVENOUS | Status: AC

## 2022-09-04 MED ORDER — NUTRISOURCE FIBER PO PACK
1.0000 | PACK | Freq: Two times a day (BID) | ORAL | Status: DC
  Administered 2022-09-04 – 2022-09-06 (×3): 1
  Filled 2022-09-04 (×4): qty 1

## 2022-09-04 MED ORDER — GABAPENTIN 100 MG PO CAPS
100.0000 mg | ORAL_CAPSULE | Freq: Three times a day (TID) | ORAL | Status: DC
  Administered 2022-09-04 – 2022-10-10 (×107): 100 mg via ORAL
  Filled 2022-09-04 (×107): qty 1

## 2022-09-04 MED ORDER — PANCRELIPASE (LIP-PROT-AMYL) 10440-39150 UNITS PO TABS
20880.0000 [IU] | ORAL_TABLET | Freq: Once | ORAL | Status: AC
  Administered 2022-09-04: 20880 [IU]
  Filled 2022-09-04: qty 2

## 2022-09-04 MED ORDER — POTASSIUM CHLORIDE 20 MEQ PO PACK
20.0000 meq | PACK | Freq: Two times a day (BID) | ORAL | Status: DC
  Administered 2022-09-04 – 2022-09-06 (×4): 20 meq via ORAL
  Filled 2022-09-04 (×5): qty 1

## 2022-09-04 NOTE — Evaluation (Signed)
Physical Therapy Assessment and Plan  Patient Details  Name: [name] MRN: [id number] Date of Birth: 03-Oct-1955  PT Diagnosis: Abnormal posture, Difficulty walking, Dizziness and giddiness, Impaired cognition, Low back pain, Muscle weakness, and Pain in surgical site Rehab Potential: Fair ELOS: 27-30 days   Today's Date: 09/04/2022 PT Individual Time: 1045-1200 PT Individual Time Calculation (min): 75 min    Hospital Problem: Principal Problem:   Infection of lumbar spine ([place])   Past Medical History:  Past Medical History:  Diagnosis Date   Anemia    Anxiety    Arthritis    Bipolar disorder ([place])    Depression    GERD (gastroesophageal reflux disease)    Heart murmur    "related to VSD"   High cholesterol    History of blood transfusion    "related to OR" (08/19/2016)   History of hiatal hernia    Hypertension    Hyperthyroidism    Mild cognitive impairment 09/06/2018   Paroxysmal ventricular tachycardia ([place])    Type II diabetes mellitus ([place])    UTI (urinary tract infection)    being treated with Keflex   Ventricular septal defect    Past Surgical History:  Past Surgical History:  Procedure Laterality Date   ABDOMINAL HYSTERECTOMY     BACK SURGERY     CARDIAC CATHETERIZATION N/A 06/23/2015   Procedure: Left Heart Cath and Coronary Angiography;  Surgeon: Larey Dresser, MD;  Location: [facility];  Service: Cardiovascular;  Laterality: N/A;   CARDIAC CATHETERIZATION  1960   "VSD was so small; didn't need repaired"   EXAM UNDER ANESTHESIA WITH MANIPULATION OF HIP Right 06/02/2014   dr Rhona Raider   FRACTURE SURGERY     HERNIA REPAIR     HIP CLOSED REDUCTION Right 06/02/2014   Procedure: CLOSED MANIPULATION HIP;  Surgeon: Hessie Dibble, MD;  Location: [facility];  Service: Orthopedics;  Laterality: Right;   JOINT REPLACEMENT     JOINT REPLACEMENT     POSTERIOR LUMBAR FUSION 4 LEVEL N/A 06/26/2022   Procedure: Lumbar One To Lumbar Five Posterior  Instrumented Fusion;  Surgeon: Judith Part, MD;  Location: [facility];  Service: Neurosurgery;  Laterality: N/A;   REFRACTIVE SURGERY Bilateral    SHOULDER ARTHROSCOPY Right    SHOULDER OPEN ROTATOR CUFF REPAIR Right    SPINAL FUSION  1996   "t10 down to my coccyx   SPINE HARDWARE REMOVAL     TOTAL ABDOMINAL HYSTERECTOMY     TOTAL HIP ARTHROPLASTY Right 05/10/2014   [place]      by dr Harrell Gave [place]   TOTAL KNEE ARTHROPLASTY Left    TOTAL SHOULDER ARTHROPLASTY Left 08/19/2016   Procedure: TOTAL SHOULDER ARTHROPLASTY;  Surgeon: Tania Ade, MD;  Location: [facility];  Service: Orthopedics;  Laterality: Left;  Left total shoulder replacement    Assessment & Plan Clinical Impression: Patient is a 67 y.o. year old female with recent admission to the hospital on *** with ***.  Patient transferred to CIR on 09/03/2022 .   Patient currently requires [id number] with mobility secondary to {impairments:3041632}.  Prior to hospitalization, patient was [id number] with mobility and lived with Spouse in a House home.  Home access is  Ramped entrance.  Patient will benefit from skilled PT intervention to {benefits:22816} for planned discharge {planned discharge:3041670}.  Anticipate patient will {follow [id number] at discharge.  PT - End of Session Activity Tolerance: Tolerates 10 - 20 min activity with multiple rests Endurance Deficit: Yes PT Assessment  Rehab Potential (ACUTE/IP ONLY): Fair PT Barriers to Discharge: Inaccessible home environment;Decreased caregiver support;Home environment access/layout;Trach;IV antibiotics;Incontinence;Wound Care;Behavior;Nutrition means PT Patient demonstrates impairments in the following area(s): Balance;Behavior;Edema;Endurance;Motor;Nutrition;Pain;Perception;Safety;Sensory;Skin Integrity PT Transfers Functional Problem(s): Bed Mobility;Bed to Chair;Car;Furniture PT Locomotion Functional Problem(s): Ambulation;Wheelchair Mobility;Stairs PT  Plan PT Intensity: Minimum of 1-2 x/day ,45 to 90 minutes PT Frequency: 5 out of 7 days PT Treatment/Interventions: Ambulation/gait training;Discharge planning;Functional mobility training;Psychosocial support;Therapeutic Activities;Visual/perceptual remediation/compensation;Wheelchair propulsion/positioning;Therapeutic Exercise;Skin care/wound management;Neuromuscular re-education;Disease management/prevention;Balance/vestibular training;Cognitive remediation/compensation;DME/adaptive equipment instruction;Pain management;Splinting/orthotics;UE/LE Strength taining/ROM;Community reintegration;Functional electrical stimulation;Patient/family education;Stair training;UE/LE Coordination activities PT Transfers Anticipated Outcome(s): MinA PT Locomotion Anticipated Outcome(s): ModA for ambulation PT Recommendation Follow Up Recommendations: Home health PT;24 hour supervision/assistance Patient destination: Home Equipment Recommended: To be determined   PT Evaluation Precautions/Restrictions Precautions Precautions: Fall;Back Precaution Comments: trach, cortrack, flexiseal, wound vac on back, using PMSV Required Braces or Orthoses: Other Brace Other Brace: prevlon boots Restrictions Weight Bearing Restrictions: No Other Position/Activity Restrictions: external rotation R hip at baseline at all times, RLE shorter than LLE, pt ambulated household distances PTA, w/c for community General   Vital SignsOxygen Therapy SpO2: 97 % O2 Device: Tracheostomy Collar O2 Flow Rate (L/min): 5 L/min FiO2 (%): 21 % Pain Pain Assessment Pain Scale: 0-10 Pain Score: 8  Pain Type: Acute pain;Chronic pain;Surgical pain Pain Location: Back Pain Orientation: Medial;Lower Pain Descriptors / Indicators: Aching;Constant Pain Onset: On-going Patients Stated Pain Goal: 0 Pain Interference Pain Interference Pain Effect on Sleep: 3. Frequently Pain Interference with Therapy Activities: 3. Frequently Pain  Interference with Day-to-Day Activities: 4. Almost constantly Home Living/Prior Functioning Home Living Available Help at Discharge: Family;Available 24 hours/day (2 daughters, son , spouse and hired Programmer, multimedia for 24/7) Type of Home: House Home Access: [place]: One level Bathroom Shower/Tub: Holiday representative Toilet: Handicapped height Additional Comments: Was Mod I prior to admit in 12/23 with RW short distances and wheelchair long disctances  Lives With: Spouse Prior Function Level of Independence: Independent with basic ADLs;Independent with homemaking with ambulation;Independent with gait;Independent with transfers;Requires assistive device for independence  Able to Take Stairs?: No Driving: No Vision/Perception  Vision - History Ability to See in Adequate Light: 0 Adequate Perception Perception: Within Functional Limits Praxis Praxis: Impaired Praxis Impairment Details: Initiation;Motor planning  Cognition Overall Cognitive Status: Impaired/Different from baseline Arousal/Alertness: Awake/alert Orientation Level: Oriented to person;Oriented to place;Oriented to situation Day of Week: Incorrect Attention: Sustained;Selective;Focused Focused Attention: Impaired Sustained Attention: Impaired Selective Attention: Impaired Memory: Impaired Executive Function: Self Correcting;Initiating Safety/Judgment: Impaired Sensation Sensation Light Touch: Appears Intact Coordination Gross Motor Movements are Fluid and Coordinated: No Fine Motor Movements are Fluid and Coordinated: No Coordination and Movement Description: severe pain and debility affect smoothness of movement as well as overall mobility Heel Shin Test: unable to test Motor  Motor Motor: Abnormal postural alignment and control;Other (comment) Motor - Skilled Clinical Observations: severe debility from weakness and pain   Trunk/Postural Assessment  Cervical Assessment Cervical Assessment:  Exceptions to [facility] (forward head, decreased postural strength when unsupported) Thoracic Assessment Thoracic Assessment: Exceptions to [facility] (scoliosis and kyphosis) Lumbar Assessment Lumbar Assessment: Exceptions to WFL (scoliosis) Postural Control Postural Control: Deficits on evaluation (intermittent lean to L with awareness of lean but no initiation to correct without vc)  Balance Balance Balance Assessed: Yes Static Sitting Balance Static Sitting - Balance Support: Bilateral upper extremity supported;Feet supported Static Sitting - Level of Assistance: 4: Min assist Dynamic Sitting Balance Dynamic Sitting - Balance Support: Bilateral upper extremity supported Dynamic Sitting - Level of Assistance: 3:  Mod assist Sitting balance - Comments: can hold posture but has intermittent bouts with lean, consistent weakness and lean to L with up to intermittent ModA to correct/ prevent. Static Standing Balance Static Standing - Balance Support: Bilateral upper extremity supported Static Standing - Level of Assistance: 1: +2 Total assist Extremity Assessment      RLE Assessment RLE Assessment: Exceptions to [facility] General Strength Comments: grossly 2-/ 5 prox to dist requiring AAROM to test LLE Assessment LLE Assessment: Exceptions to [facility] General Strength Comments: grossly 2-/ 5 prox to dist requiring AAROM to test  Care Tool Care Tool Bed Mobility Roll left and right activity   Roll left and right assist level: Total Assistance - Patient < 25%    Sit to lying activity   Sit to lying assist level: 2 Helpers    Lying to sitting on side of bed activity   Lying to sitting on side of bed assist level: the ability to move from lying on the back to sitting on the side of the bed with no back support.: 2 Helpers     Care Tool Transfers Sit to stand transfer   Sit to stand assist level: 2 Helpers    Chair/bed transfer Chair/bed transfer activity did not occur: Safety/medical concerns        Toilet transfer Toilet transfer activity did not occur: Safety/medical concerns      Scientist, product/process development transfer activity did not occur: Safety/medical concerns        Care Tool Locomotion Ambulation Ambulation activity did not occur: Safety/medical concerns        Walk 10 feet activity Walk 10 feet activity did not occur: Safety/medical concerns       Walk 50 feet with 2 turns activity Walk 50 feet with 2 turns activity did not occur: Safety/medical concerns      Walk 150 feet activity Walk 150 feet activity did not occur: Safety/medical concerns      Walk 10 feet on uneven surfaces activity Walk 10 feet on uneven surfaces activity did not occur: Safety/medical concerns      Stairs Stair activity did not occur: Safety/medical concerns        Walk up/down 1 step activity Walk up/down 1 step or curb (drop down) activity did not occur: Safety/medical concerns      Walk up/down 4 steps activity Walk up/down 4 steps activity did not occur: Safety/medical concerns      Walk up/down 12 steps activity Walk up/down 12 steps activity did not occur: Safety/medical concerns      Pick up small objects from floor Pick up small object from the floor (from standing position) activity did not occur: Safety/medical concerns      Wheelchair Is the patient using a wheelchair?: Yes Type of Wheelchair: Manual Wheelchair activity did not occur: Safety/medical concerns      Wheel 50 feet with 2 turns activity Wheelchair 50 feet with 2 turns activity did not occur: Safety/medical concerns    Wheel 150 feet activity Wheelchair 150 feet activity did not occur: Safety/medical concerns      Refer to Care Plan for [place] 1    Recommendations for other services: {RECOMMENDATIONS FOR OTHER SERVICES:3049016}  Skilled Therapeutic Intervention Mobility Bed Mobility Bed Mobility: Rolling Right;Rolling Left;Sit to Sidelying Right;Supine to Sit;Sit to  Supine Rolling Right: Total Assistance - Patient < 25% Rolling Left: Total Assistance - Patient < 25% Supine to Sit: 2 Helpers Sit to Supine: 2  Helpers Sit to Sidelying Right: 2 Helpers Transfers Transfers: Sit to Stand;Stand to Sit Sit to Stand: 2 Helpers Stand to Sit: 2 Helpers Transfer (Assistive device): 2 person hand held assist (2 person assist AND RW) Locomotion  Gait Ambulation: No Gait Gait: No Stairs / Additional Locomotion Stairs: No Wheelchair Mobility Wheelchair Mobility: No   Discharge Criteria: Patient will be discharged from PT if patient refuses treatment 3 consecutive times without medical reason, if treatment goals not met, if there is a change in medical status, if patient makes no progress towards goals or if patient is discharged from hospital.  The above assessment, treatment plan, treatment alternatives and goals were discussed and mutually agreed upon: {Assessment/Treatment Plan Discussed/Agreed:3049017}  Alger Simons 09/04/2022, 12:49 PM

## 2022-09-04 NOTE — Evaluation (Signed)
Speech Language Pathology Assessment and Plan  Patient Details  Name: [name] MRN: [id number] Date of Birth: [birth year]  SLP Diagnosis: Cognitive Impairments;Dysphagia;Voice disorder  Rehab Potential: Good ELOS: 20-27 days    Today's Date: 09/04/2022 SLP Individual Time: [id number] SLP Individual Time Calculation (min): 53 min   Hospital Problem: Principal Problem:   Infection of lumbar spine ([place])  Past Medical History:  Past Medical History:  Diagnosis Date   Anemia    Anxiety    Arthritis    Bipolar disorder ([place])    Depression    GERD (gastroesophageal reflux disease)    Heart murmur    "related to VSD"   High cholesterol    History of blood transfusion    "related to OR" (08/19/2016)   History of hiatal hernia    Hypertension    Hyperthyroidism    Mild cognitive impairment 09/06/2018   Paroxysmal ventricular tachycardia ([facility])    Type II diabetes mellitus ([place])    UTI (urinary tract infection)    being treated with Keflex   Ventricular septal defect    Past Surgical History:  Past Surgical History:  Procedure Laterality Date   ABDOMINAL HYSTERECTOMY     BACK SURGERY     CARDIAC CATHETERIZATION N/A 06/23/2015   Procedure: Left Heart Cath and Coronary Angiography;  Surgeon: Larey Dresser, MD;  Location: [facility];  Service: Cardiovascular;  Laterality: N/A;   CARDIAC CATHETERIZATION  1960   "VSD was so small; didn't need repaired"   EXAM UNDER ANESTHESIA WITH MANIPULATION OF HIP Right 06/02/2014   dr Rhona Raider   FRACTURE SURGERY     HERNIA REPAIR     HIP CLOSED REDUCTION Right 06/02/2014   Procedure: CLOSED MANIPULATION HIP;  Surgeon: Hessie Dibble, MD;  Location: [facility];  Service: Orthopedics;  Laterality: Right;   JOINT REPLACEMENT     JOINT REPLACEMENT     POSTERIOR LUMBAR FUSION 4 LEVEL N/A 06/26/2022   Procedure: Lumbar One To Lumbar Five Posterior Instrumented Fusion;  Surgeon: Judith Part, MD;  Location: [facility];  Service:  Neurosurgery;  Laterality: N/A;   REFRACTIVE SURGERY Bilateral    SHOULDER ARTHROSCOPY Right    SHOULDER OPEN ROTATOR CUFF REPAIR Right    SPINAL FUSION  1996   "t10 down to my coccyx   SPINE HARDWARE REMOVAL     TOTAL ABDOMINAL HYSTERECTOMY     TOTAL HIP ARTHROPLASTY Right 05/10/2014   [facility]      by dr Harrell Gave olcott   TOTAL KNEE ARTHROPLASTY Left    TOTAL SHOULDER ARTHROPLASTY Left 08/19/2016   Procedure: TOTAL SHOULDER ARTHROPLASTY;  Surgeon: Tania Ade, MD;  Location: [facility];  Service: Orthopedics;  Laterality: Left;  Left total shoulder replacement    Assessment / Plan / Recommendation Clinical Impression   [name] is a 67 year old female with history of PVT, T2DM, small membranous VSD, depression, bipolar d/o, mild cognitive impairment,, fall 05/2022 with 3 column unstable fracture through prior scoliosis construct s/p instrumentation with exposed hardware and wound dehiscence. She was admitted on 08/09/2022 for revision of complex lumbar wound, removal of L1-L5 lumbar screws and rods with revision L1-L5 posterior fusion by Dr. [name].    Postop had issues with fatigue cough, hypotension as well as confusion overnight on 01/24. BLE Dopplers ordered for follow-up on 01/22 and was negative for DVT and showed hypoechoic collection in left popliteal fossa.  Foley placed due to ongoing issues with urinary retention.  Due  to ongoing confusion and lethargy, antibiotics added due to concerns of UTI and oxycodone dose decreased due to concerns of side effects.  She started developing drainage from her wound on 01/28 as well as watery diarrhea and stool was negative for C. difficile.   She had rising white count to 19.9 with lactic acidosis on 01/31.  She was started on broad-spectrum antibiotics and continued to be febrile with Tmax 103.  ID consulted for input due to ongoing fevers and recommended changing antibiotics to vancomycin, cefepime and metronidazole as well as  repeat CTA chest/CT spine.  Chest CT showed multifocal patchy or base opacities throughout RUL, RML and bilateral lower lobes worrisome for multifocal pneumonia as well as acute/subacute right 9-10 rib Fx and CT lumbar spine showed subacute fracture through L3 vertebral body and increased air-fluid and posterior soft tissues unexpected in setting of recent surgery.  CT head was negative for acute changes.     [name] consulted due to worsening of respiratory status due to sepsis from acute meningitis and asked patient pneumonia and she required intubation on 02/01.  Neurology consulted due to concerns of seizures and EEG showed moderate diffuse encephalopathy.  MRI brain done showing punctate foci of acute ischemia right frontal lobe and diffusion abnormality within the occipital horn of left lateral ventricle with proteinaceous/purulent debris.  MRI spine showed L2-L5 fusion widely patent with decrease in bone marrow edema L3.  Dr. [name] recommended continuing pain and Flagyl for aspiration pneumonia as well as CNS penetration and given hardware exposure, she would require at least 6 weeks of treatment empirically to treat osteomyelitis.  Tracheal aspirate positive for Candida but not felt to be a pathogen in lung per ID.   Blood cultures x 2 negative.  Core track placed for nutritional support. She was briefly extubated on 02/06 but reintubated due to fatigue and increased WOB.  She underwent tracheostomy on 02/06, was extubated to ATC and is tolerating PMV.  Follow-up MRI brain 68/13 showed "dependent diffusion abnormality in occipital horn of left lateral ventricle with ependymal enhancement concerning for ventriculitis". MBS done on 02/12 and patient cleared for regular diet w/thins. She did have emesis with episode of desaturation and copious secretions on 02/13. She has had multiple voiding trials--last on 02/10 and foley was replaced. Wound VAC changed today and reports of fecal management system in place  due to ongoing loose stool and MASD/prevent wound contamination. Trach to be downsized to CFS# 4 today.    PT/OT has been working with patient who is showing participation and progression with therapy. She is able to transfer on LLE with +2 max assist. She continues to be limited by weakness, cognitive deficits requiring increased time to process as well as verbal and tactile cues to follow simple commands, displays poor attention and memory. CIR recommended due to functional decline.     Skilled Therapeutic Interventions          Patient was seen for skilled ST assessment with focus on PMSV tolerance and cognitive-communication.  Upon entering room, patient was alone, but reported her son was downstairs.  Patient was reluctant to complete ST assessment, reporting "this is "bullsh**". SLP explained reasoning/purpose/importance of assessment.  Patient asked several times what therapy she was in and repeated several questions that had already been answered.  Patient was responsive to gaining rapport via humor and eventually agreed to assessment.  SLP began assessment of Passy-Muir valve tolerance.  Cuff was deflated and PMV was donned upon SLP arrival.  No suctioning was required throughout assessment.  Patient had weak, continuous cough throughout assessment. Voice was loud and clear; though, breath support appeared diminished.  Per previous SLP, patient is able to wear speaking valve throughout the day; however, must doff valve at night.  Patient is currently tolerating a regular diet with thin liquids.  Did not assess this date as patient did not want to eat.  Will continue with current diet and monitor for tolerance.  When son entered room, there were no reports of difficulty eating.   SLP attempted to fully assess cognitive skills.  Patient required reminding of the importance of her thinking skills and how we use them in daily life.  SLP encouraged importance of therapy to work towards gaining  independence outside of the hospital.  SLP completed informal assessment of cognitive skills.  Patient demonstrated difficulty with clock drawing task indicating impairments in executive functioning skills.  Patient also demonstrated impaired processing as indicated by difficulty with generative naming task (7 animals in 1 minute).  Patient scored a 5 out of 10 on CLQT " story retell" subtest indicating decreased STM for new information.  Patient had asked SLP if she had any kids.  SLP responded yes and told patient child's name.  Patient was unable to recall 20 minutes later; however, given the first initial, was able to recall correctly.    SLP provided education to son and patient.  Son asked if he was able to report SLP and SLP agreed.  SLP encouraged son and patient to reach out with any questions.  SLP recommends skilled services to address cognitive- communication impairment ,as well as,  continue monitoring voice and swallowing.   SLP Assessment  Patient will need skilled Speech Lanaguage Pathology Services during CIR admission    Recommendations  Patient may use Passy-Muir Speech Valve: During all therapies with supervision;During all waking hours (remove during sleep);During PO intake/meals;Caregiver trained to provide supervision PMSV Supervision: Intermittent SLP Diet Recommendations: Age appropriate regular solids;Thin Liquid Administration via: Cup Medication Administration: Whole meds with liquid Supervision: Staff to assist with self feeding Compensations: Minimize environmental distractions;Slow rate;Small sips/bites Postural Changes and/or Swallow Maneuvers: Seated upright 90 degrees Oral Care Recommendations: Oral care BID Patient destination: Home Follow up Recommendations: Outpatient SLP;Home Health SLP    SLP Frequency 3 to 5 out of 7 days   SLP Duration  SLP Intensity  SLP Treatment/Interventions 20-27 days  Minumum of 1-2 x/day, 30 to 90 minutes  Cognitive  remediation/compensation;Internal/external aids;Cueing hierarchy;Patient/family education;Therapeutic Activities;Functional tasks;Therapeutic Exercise    Pain Pain Assessment Pain Scale: 0-10 Pain Score: 4  Pain Type: Acute pain;Chronic pain;Surgical pain Pain Location: Head Pain Orientation: Medial;Lower Pain Descriptors / Indicators: Headache Pain Onset: On-going Patients Stated Pain Goal: 0 Pain Intervention(s): RN made aware  Prior Functioning Cognitive/Linguistic Baseline: Within functional limits Type of Home: House  Lives With: Spouse Available Help at Discharge: Family;Available 24 hours/day Vocation: Other (Comment) Chief Financial Officer; with studio)  SLP Evaluation Cognition Overall Cognitive Status: Impaired/Different from baseline Arousal/Alertness: Awake/alert Orientation Level: Oriented to person;Oriented to place;Oriented to situation Day of Week: Incorrect Attention: Focused;Sustained;Selective Focused Attention: Impaired Focused Attention Impairment: Verbal basic Sustained Attention: Impaired Sustained Attention Impairment: Verbal basic Selective Attention: Impaired Selective Attention Impairment: Verbal basic Memory: Impaired Memory Impairment: Retrieval deficit;Decreased short term memory;Decreased recall of new information;Storage deficit Decreased Short Term Memory: Verbal basic Awareness: Impaired Awareness Impairment: Emergent impairment Problem Solving: Appears intact Executive Function: Organizing;Self Monitoring;Self Correcting Organizing: Impaired Organizing Impairment: Verbal complex Initiating: Impaired Initiating Impairment:  Verbal complex Self Monitoring: Impaired Self Monitoring Impairment: Verbal complex Self Correcting: Impaired Self Correcting Impairment: Verbal complex Behaviors: Poor frustration tolerance;Other (comment) (Reduced filter) Safety/Judgment: Impaired  Comprehension Auditory Comprehension Overall Auditory Comprehension: Appears  within functional limits for tasks assessed Reading Comprehension Reading Status: Not tested Expression Expression Primary Mode of Expression: Verbal Verbal Expression Overall Verbal Expression: Appears within functional limits for tasks assessed Written Expression Dominant Hand: Right Written Expression: Within Functional Limits (Needs further assessment at >word level) Oral Motor Oral Motor/Sensory Function Overall Oral Motor/Sensory Function: Within functional limits Motor Speech Overall Motor Speech: Appears within functional limits for tasks assessed Respiration: Within functional limits Level of Impairment: Conversation Phonation: Normal Intelligibility: Intelligible Motor Planning: Witnin functional limits  Care Tool Care Tool Cognition Ability to hear (with hearing aid or hearing appliances if normally used Ability to hear (with hearing aid or hearing appliances if normally used): 0. Adequate - no difficulty in normal conservation, social interaction, listening to TV   Expression of Ideas and Wants Expression of Ideas and Wants: 3. Some difficulty - exhibits some difficulty with expressing needs and ideas (e.g, some words or finishing thoughts) or speech is not clear   Understanding Verbal and Non-Verbal Content Understanding Verbal and Non-Verbal Content: 3. Usually understands - understands most conversations, but misses some part/intent of message. Requires cues at times to understand  Memory/Recall Ability Memory/Recall Ability : That he or she is in a hospital/hospital unit   PMSV Assessment  PMSV Trial PMSV was placed for: 60 min Able to redirect subglottic air through upper airway: Yes Able to Attain Phonation: Yes Voice Quality: Normal Able to Expectorate Secretions: No attempts Level of Secretion Expectoration with PMSV: Tracheal Breath Support for Phonation: Adequate Intelligibility: Intelligible Behavior: Alert;Cooperative;Expresses self well;Responsive to  questions  Bedside Swallowing Assessment General    Oral Care Assessment   Ice Chips   Thin Liquid   Nectar Thick   Honey Thick   Puree   Solid   BSE Assessment    Short Term Goals: Week 1: SLP Short Term Goal 1 (Week 1): Pt will tolerate current diet with no overt s/sx of aspiration. SLP Short Term Goal 2 (Week 1): Pt will tolerate speaking valve and recall how to remove speaking valve in case of an emergency independently. SLP Short Term Goal 3 (Week 1): Pt will use external memory aids to recall important information with minA verbal cues. SLP Short Term Goal 4 (Week 1): Pt will recall 2 areas of cognitive impairment with minA verbal cues. SLP Short Term Goal 5 (Week 1): Pt will participate in therapeutic task for 20 minutes with minA verbal cues.  Refer to Care Plan for Long Term Goals  Recommendations for other services: None   Discharge Criteria: Patient will be discharged from SLP if patient refuses treatment 3 consecutive times without medical reason, if treatment goals not met, if there is a change in medical status, if patient makes no progress towards goals or if patient is discharged from hospital.  The above assessment, treatment plan, treatment alternatives and goals were discussed and mutually agreed upon: by patient  Verdene Lennert 09/04/2022, 2:57 PM

## 2022-09-04 NOTE — Progress Notes (Signed)
Informed Dr Ranell Patrick about clogged coretrak and flexi seal. Dr said to leave flexi seal until she is able to assess it. Dr also stated to leave the feed off until she can assess and to ask patient if she feels as if she can eat enough without it. This nurse informed incoming nurse.

## 2022-09-04 NOTE — Progress Notes (Signed)
PROGRESS NOTE   Subjective/Complaints:  Pt slept poorly because of constant interruptions. Would like to have minimal interruptions from 11p-5a if possible. Discussed this with her and daughter.  Has clogged cortrak, RD aware and made recs remotely. Nursing attempting to unclog, advised charge nurse to continue to attempt to unclog but if unable to use cortrak overnight, will do IVFs for hydration.  Flexiseal clogged, but during day shift able to unclog. Has chronic diarrhea at baseline. Pain not great, but meds help some.  No additional complaints or concerns today.   Daughter wants to make sure cortisol testing doesn't get missed. Deferred to weekday team.   ROS: +diarrhea, +back pain, +insomnia/poor sleep. Denies fevers, chills, CP, SOB, abd pain, N/V/C, new/worsening paresthesias/weakness, or any other complaints at this time.    Objective:   DG CHEST PORT 1 VIEW  Result Date: 09/03/2022 CLINICAL DATA:  Borderline low O2 saturation. EXAM: PORTABLE CHEST 1 VIEW COMPARISON:  08/24/2022. FINDINGS: The heart size and mediastinal contours are within normal limits. Tracheostomy tube terminates 3.3 cm above the carina. A right internal jugular central venous catheter terminates over the right atrium. Mild airspace disease is noted at the lung bases. There is a small left pleural effusion. No pneumothorax. Shoulder arthroplasty changes are noted on the left. There is partial visualization of lumbar spinal fusion hardware. An enteric tube courses over the stomach and out of the field of view. IMPRESSION: 1. Mild atelectasis or infiltrate at the lung bases. 2. Small left pleural effusion. Electronically Signed   By: Brett Fairy M.D.   On: 09/03/2022 21:13   Recent Labs    09/03/22 0405 09/04/22 0145  WBC 7.8 7.3  HGB 8.4* 8.6*  HCT 25.3* 25.8*  PLT 403* 359   Recent Labs    09/03/22 0405 09/04/22 0145  NA 130* 129*  K 3.2* 4.0   CL 94* 95*  CO2 26 26  GLUCOSE 137* 96  BUN 7* 5*  CREATININE <0.30* <0.30*  CALCIUM 7.9* 8.3*    Intake/Output Summary (Last 24 hours) at 09/04/2022 0725 Last data filed at 09/04/2022 0400 Gross per 24 hour  Intake 450 ml  Output --  Net 450 ml        Physical Exam: Vital Signs Blood pressure (!) 148/82, pulse 75, temperature 98 F (36.7 C), temperature source Oral, resp. rate 18, height 5' 1"$  (1.549 m), weight 71.9 kg, SpO2 98 %.  Constitutional:  thin    Appearance: Normal appearance.  HENT: [place]/AT, Cortrak in place, MMM.   Eyes: EOMI, conjunctiva clear, PERRL Neck:     Comments: #6 trach in place. Pt able to speak around trach fairly well. Excellent phonation with PMV Cardiovascular: RRR, soft murmur heard (VSD), 1+ BLE swelling Pulmonary: CTAB no w/r/r, no increased WOB Abdominal: Soft, NTND, +BS throughout, no r/g/r  Genitourinary:    Comments: Rectal tube and foley in place. Rectal tube with brown stool in tubing  PRIOR EXAM: Musculoskeletal:        General: Swelling present.     Cervical back: Normal range of motion.     Comments: Right leg discrepancy with weakness and leg rotated outward. Dependent edema Right forearm  and hand and left forearm.   Skin:    Comments: Well healed old right thigh incision. Wound VAC in place, low back  Neurological:     Mental Status: She is alert and oriented to person, place, and time.     Comments: Alert and oriented x 3 (needed some help with day) much improved insight and awareness. [place] Memory. Normal language and speech. Cranial nerve exam unremarkable. UE 4/5 prox to distal. RLE 2-/5 prox to 4/5 distally. LLE 2+/5 prox to 4/5 distally. Senses pain in all 4 limbs. No abnl resting tone.   Psychiatric:        Mood and Affect: Mood normal.        Behavior: Behavior normal.   Assessment/Plan: 1. Functional deficits which require 3+ hours per day of interdisciplinary therapy in a comprehensive inpatient rehab  setting. Physiatrist is providing close team supervision and 24 hour management of active medical problems listed below. Physiatrist and rehab team continue to assess barriers to discharge/monitor patient progress toward functional and medical goals  Care Tool:  Bathing              Bathing assist       Upper Body Dressing/Undressing Upper body dressing        Upper body assist      Lower Body Dressing/Undressing Lower body dressing            Lower body assist       Toileting Toileting    Toileting assist       Transfers Chair/bed transfer  Transfers assist           Locomotion Ambulation   Ambulation assist              Walk 10 feet activity   Assist           Walk 50 feet activity   Assist           Walk 150 feet activity   Assist           Walk 10 feet on uneven surface  activity   Assist           Wheelchair     Assist               Wheelchair 50 feet with 2 turns activity    Assist            Wheelchair 150 feet activity     Assist          Blood pressure (!) 148/82, pulse 75, temperature 98 F (36.7 C), temperature source Oral, resp. rate 18, height 5' 1"$  (1.549 m), weight 71.9 kg, SpO2 98 %.  Medical Problem List and Plan: 1. Functional deficits secondary to osteomyelitis,infection of lumbar surgical site complicated by meningitis/ventriculitis.             -pt also with significant deconditioning             -patient may not yet shower             -ELOS/Goals: 20-27 days, supervision to min assist with PT, OT, SLP  -Continue CIR 2.  Antithrombotics: -DVT/anticoagulation:  Pharmaceutical: Lovenox 48m QD             -antiplatelet therapy: N/a 3. Pain Management: Butrans 550m/hr patch for pain control w/ Flexeril 62m81mID, Tylenol PRN, Oxycodone 7.62mg83mh PRN  -09/04/22 increased flexeril to 7.62mg 32m, monitor for effect 4. Mood/Behavior/Sleep: LCSW to follow for  evaluation  and support.             --Trazodone 162m at bedtime for  insomnia/mood  -09/04/22 placed sign on door to minimize interruptions 11p-5a; changed CBG checks as below to minimize interruptions             -antipsychotic agents:  Luvox 1024mQHS and Lamictal 20045mD  -Continue Aricept 77m84m 5. Neuropsych/cognition: This patient is not fully capable of making decisions on her own behalf. 6. Skin/Wound Care: WOC to follow for wound VAC changes --MASD managed by fecal system-- flexiseal clogged 09/04/22 AM but nursing states it's now working; monitor 7. Fluids/Electrolytes/Nutrition: Monitor I/O. Continue tube feeds-->likely contributing to diarrhea             -pt had unrecorded intake today             -both she and daughters would like to transition from NGT             -will cut TF in half tonight             -monitor intake closely, continue vitamins             -09/04/22 CMP fairly stable, monitor weekly labs starting 09/06/22 -Cortrak clogged this morning 09/04/22, RD made recs for unclogging, nursing to attempt to continue unclogging overnight-- if unsuccessful, nursing will let me know, and pt will need cortrak team consult on Monday; since it's still not functional, will give gentle IVFs overnight, recheck Cortrak status in AM; push PO intake today 8.Sepsis w/ osteomyelitis/meningitis/encephalitis: Continue Cefepime 2g q8h, Vanc 1250mg24mh, and Flagyl 500mg 9mfor at least 6 weeks             --end date per ID. Will follow up on plan during admit  9. H/o PVT:  Monitor BP/heart rate TID--on coreg 12.5mg BI42mLabetaolol 77mg q247mN  -09/04/22 BP/HR fairly well controlled, cont regimen, monitor Vitals:   09/03/22 1841 09/03/22 1842 09/03/22 2017 09/04/22 0341  BP: (!) 146/84 (!) 146/84 (!) 148/83 (!) 148/82   09/04/22 1445  BP: (!) 160/91    10. Urinary retention: Has failed 4 voiding trails. Has had foley since 12/23             -maintain foley cath, foley mgt per protocol --  Continue Flomax 0.4mg qPM.15mll d/c urecholine.  11. VDRF s/p trach: Tolerating PMSV well -I ordered downsizing to CFS # 4 today--tolerated well -continue ATC w/ pulmonary hygiene -begin day time plugging of trach tomorrow. Decannulate soon. 12. T2DM: Monitor BS ac/hs and use SSI for elevated BS.              --continue tube feeds with supplements between meals due to poor intake             --continue liberalized diet for now. -09/04/22 CBGs looking great, Changed SSI and CBG checks to TID AC + QHS to minimize interruptions overnight, since tube feeds are decreased/temporarily stopped currently; if tube feeds continued longer term, consider changing back to q4h if desired CBG (last 3)  Recent Labs    09/04/22 0818 09/04/22 1150 09/04/22 1649  GLUCAP 124* 159* 105*    13. Hypokalemia: Recurrent likely due to diarrhea/intake.              --will add standing dose BID -->recheck labs on 02/17 and 02/19  -09/04/22 K+ 4.0, continue Klor 20mEq [id number] on weekly labs 14. Anemia of critical illness: Hgb 7.5-8.9 range -09/04/22  Hgb stable at 8.6, continue to monitor on weekly labs starting 09/06/22 15. Hyponatremia/hypochloremia: Improving 125/87-->130/94.  -09/04/22 Na 129, Cl 95, both stable, continue salt tabs 1g BID -Cortisol testing done during hospitalization (13.8 on 08/30/22), was normal, but daughter and pt want to make sure this is followed up--defer to weekday team regarding ongoing ?Addison's testing 16. Encephalopathy/Delirium: Continues to have intermittent confusion--but much improved compared to 3 days ago per family.  --Continue delirium precautions. 17. Loose stools likely d/t TF and abx.             -reducing TF as above.  -add probiotics and fiber-- fiber changed to Nutrisource per RD on 09/04/22 since this should cause less clogging of Cortrak -Lomotil 1-2 tabs QID PRN 18. Hypothyroidism: continue synthroid 48mg QD 19. Chronic combined CHF, BLE Swelling: recent 2D echo  done 08/20/2022 showed apical hypokinesis, LVEF 40-45%, RV function was reduced. Intermittently diuresed during hospitalization, most recently given 465mIV lasix on 09/03/22 -09/04/22 noted to still have BLE swelling, start 2052mO lasix and monitor lytes/BP/swelling  LOS: 1 days A FACE TO FACPascoag17/2024, 7:25 AM

## 2022-09-04 NOTE — Plan of Care (Signed)
  Problem: RH Balance Goal: LTG: Patient will maintain dynamic sitting balance (OT) Description: LTG:  Patient will maintain dynamic sitting balance with assistance during activities of daily living (OT) Flowsheets (Taken 09/04/2022 1620) LTG: Pt will maintain dynamic sitting balance during ADLs with: Contact Guard/Touching assist Goal: LTG Patient will maintain dynamic standing with ADLs (OT) Description: LTG:  Patient will maintain dynamic standing balance with assist during activities of daily living (OT)  Flowsheets (Taken 09/04/2022 1620) LTG: Pt will maintain dynamic standing balance during ADLs with: Moderate Assistance - Patient 50 - 74%   Problem: Sit to Stand Goal: LTG:  Patient will perform sit to stand in prep for activites of daily living with assistance level (OT) Description: LTG:  Patient will perform sit to stand in prep for activites of daily living with assistance level (OT) Flowsheets (Taken 09/04/2022 1620) LTG: PT will perform sit to stand in prep for activites of daily living with assistance level: Minimal Assistance - Patient > 75%   Problem: RH Eating Goal: LTG Patient will perform eating w/assist, cues/equip (OT) Description: LTG: Patient will perform eating with assist, with/without cues using equipment (OT) Flowsheets (Taken 09/04/2022 1620) LTG: Pt will perform eating with assistance level of: Set up assist    Problem: RH Grooming Goal: LTG Patient will perform grooming w/assist,cues/equip (OT) Description: LTG: Patient will perform grooming with assist, with/without cues using equipment (OT) Flowsheets (Taken 09/04/2022 1620) LTG: Pt will perform grooming with assistance level of: Set up assist    Problem: RH Bathing Goal: LTG Patient will bathe all body parts with assist levels (OT) Description: LTG: Patient will bathe all body parts with assist levels (OT) Flowsheets (Taken 09/04/2022 1620) LTG: Pt will perform bathing with assistance level/cueing: Moderate  Assistance - Patient 50 - 74%   Problem: RH Dressing Goal: LTG Patient will perform upper body dressing (OT) Description: LTG Patient will perform upper body dressing with assist, with/without cues (OT). Flowsheets (Taken 09/04/2022 1620) LTG: Pt will perform upper body dressing with assistance level of: Minimal Assistance - Patient > 75% Goal: LTG Patient will perform lower body dressing w/assist (OT) Description: LTG: Patient will perform lower body dressing with assist, with/without cues in positioning using equipment (OT) Flowsheets (Taken 09/04/2022 1620) LTG: Pt will perform lower body dressing with assistance level of: Moderate Assistance - Patient 50 - 74%   Problem: RH Toileting Goal: LTG Patient will perform toileting task (3/3 steps) with assistance level (OT) Description: LTG: Patient will perform toileting task (3/3 steps) with assistance level (OT)  Flowsheets (Taken 09/04/2022 1620) LTG: Pt will perform toileting task (3/3 steps) with assistance level: Moderate Assistance - Patient 50 - 74%   Problem: RH Toilet Transfers Goal: LTG Patient will perform toilet transfers w/assist (OT) Description: LTG: Patient will perform toilet transfers with assist, with/without cues using equipment (OT) Flowsheets (Taken 09/04/2022 1620) LTG: Pt will perform toilet transfers with assistance level of: Moderate Assistance - Patient 50 - 74%   Problem: RH Attention Goal: LTG Patient will demonstrate this level of attention during functional activites (OT) Description: LTG:  Patient will demonstrate this level of attention during functional activites  (OT) Flowsheets (Taken 09/04/2022 1620) Patient will demonstrate this level of attention during functional activites: Sustained Patient will demonstrate above attention level in the following environment: Home LTG: Patient will demonstrate this level of attention during functional activites (OT): Supervision

## 2022-09-04 NOTE — Progress Notes (Signed)
Initial Nutrition Assessment  DOCUMENTATION CODES:   Not applicable  INTERVENTION:   Continue Multivitamin w/ minerals daily If pt requires a fiber supplement, recommend switching to Nutrisource Fiber. Ensure Enlive po TID, each supplement provides 350 kcal and 20 grams of protein. Encourage good PO intake  RD to order unclogging protocol for Cortrak Recommend continue Nocturnal Tube Feeds once Cortrak is unclogged: Glucerna 1.5 at 75 mL/hr x 12 hours from 1800 to 0600 (900 mL per day) Provides 1350 kcal, 74 grams protein, and 683 mL free water daily.   NUTRITION DIAGNOSIS:   Increased nutrient needs related to wound healing as evidenced by estimated needs.  GOAL:   Patient will meet greater than or equal to 90% of their needs  MONITOR:   PO intake, Supplement acceptance, Skin, Labs  REASON FOR ASSESSMENT:   Consult Assessment of nutrition requirement/status  ASSESSMENT:   67 y.o. female admitted to CIR after revision of lumbar surgery after non healing and exposed hardware. Required trach and Cortrak placement. PMH includes HTN, GERD, HLD and T2DM.   RD working remotely at time of assessment.  Discussed with RN, pt Cortrak is currently clogged. RD suspects clog is related to medication administration. Fibercon increase risk of tube clogging, recommend switching to Nutrisource Fiber. RN reports that pt did not eat well for breakfast today.   Medications reviewed and include: Pepcid, NovoLog SSI, MVI, Fibercon, Potassium Chloride, Florastor, Sodium Chloride tablet, IV antibiotics  Labs reviewed: Sodium 129, Potassium 4.0, Magnesium 1.7, 24 hr CBGs 93-129  NUTRITION - FOCUSED PHYSICAL EXAM:  Deferred to follow-up.   Diet Order:   Diet Order             Diet regular Fluid consistency: Thin  Diet effective now                   EDUCATION NEEDS:   No education needs have been identified at this time  Skin:  Skin Assessment: Skin Integrity Issues: Skin  Integrity Issues:: Wound VAC, Incisions Wound Vac: Back Incisions: Back  Last BM:  2/16 - Type 6  Height:  Ht Readings from Last 1 Encounters:  09/03/22 5' 1"$  (1.549 m)   Weight:  Wt Readings from Last 1 Encounters:  09/03/22 71.9 kg   Ideal Body Weight:  45.5 kg  BMI:  Body mass index is 29.95 kg/m.  Estimated Nutritional Needs:  Kcal:  1800-2000 Protein:  90-110 grams Fluid:  >/= 1.8 L   Hermina Barters RD, LDN Clinical Dietitian See [facility] for contact information.

## 2022-09-04 NOTE — Progress Notes (Signed)
RT called to Pt room due to trach dislodgement. RT noticed Pt was in no distress and trach was placed back in without any complications. CO2 detector used with good color change, VS stable and sats 99%.

## 2022-09-04 NOTE — Evaluation (Signed)
Occupational Therapy Assessment and Plan  Patient Details  Name: [name] MRN: [id number] Date of Birth: 05/28/1956  OT Diagnosis: abnormal posture, acute pain, apraxia, cognitive deficits, muscular wasting and disuse atrophy, and muscle weakness (generalized) Rehab Potential: Rehab Potential (ACUTE ONLY): Fair ELOS: 28-30 days   Today's Date: 09/04/2022 OT Individual Time: 1430-1535 OT Individual Time Calculation (min): 65 min     Hospital Problem: Principal Problem:   Infection of lumbar spine ([place])   Past Medical History:  Past Medical History:  Diagnosis Date   Anemia    Anxiety    Arthritis    Bipolar disorder ([place])    Depression    GERD (gastroesophageal reflux disease)    Heart murmur    "related to VSD"   High cholesterol    History of blood transfusion    "related to OR" (08/19/2016)   History of hiatal hernia    Hypertension    Hyperthyroidism    Mild cognitive impairment 09/06/2018   Paroxysmal ventricular tachycardia (HCC)    Type II diabetes mellitus ([place])    UTI (urinary tract infection)    being treated with Keflex   Ventricular septal defect    Past Surgical History:  Past Surgical History:  Procedure Laterality Date   ABDOMINAL HYSTERECTOMY     BACK SURGERY     CARDIAC CATHETERIZATION N/A 06/23/2015   Procedure: Left Heart Cath and Coronary Angiography;  Surgeon: Larey Dresser, MD;  Location: [facility];  Service: Cardiovascular;  Laterality: N/A;   CARDIAC CATHETERIZATION  1960   "VSD was so small; didn't need repaired"   EXAM UNDER ANESTHESIA WITH MANIPULATION OF HIP Right 06/02/2014   dr Rhona Raider   FRACTURE SURGERY     HERNIA REPAIR     HIP CLOSED REDUCTION Right 06/02/2014   Procedure: CLOSED MANIPULATION HIP;  Surgeon: Hessie Dibble, MD;  Location: [facility] [facility];  Service: Orthopedics;  Laterality: Right;   JOINT REPLACEMENT     JOINT REPLACEMENT     POSTERIOR LUMBAR FUSION 4 LEVEL N/A 06/26/2022   Procedure: Lumbar One To  Lumbar Five Posterior Instrumented Fusion;  Surgeon: Judith Part, MD;  Location: [facility];  Service: Neurosurgery;  Laterality: N/A;   REFRACTIVE SURGERY Bilateral    SHOULDER ARTHROSCOPY Right    SHOULDER OPEN ROTATOR CUFF REPAIR Right    SPINAL FUSION  1996   "t10 down to my coccyx   SPINE HARDWARE REMOVAL     TOTAL ABDOMINAL HYSTERECTOMY     TOTAL HIP ARTHROPLASTY Right 05/10/2014   [facility]      by dr Harrell Gave [place]   TOTAL KNEE ARTHROPLASTY Left    TOTAL SHOULDER ARTHROPLASTY Left 08/19/2016   Procedure: TOTAL SHOULDER ARTHROPLASTY;  Surgeon: Tania Ade, MD;  Location: [facility];  Service: Orthopedics;  Laterality: Left;  Left total shoulder replacement    Assessment & Plan Clinical Impression:  [name] is a 67 year old female with history of PVT, T2DM, small membranous VSD, depression, bipolar d/o, mild cognitive impairment,, fall 05/2022 with 3 column unstable fracture through prior scoliosis construct s/p instrumentation with exposed hardware and wound dehiscence. She was admitted on 08/09/2022 for revision of complex lumbar wound, removal of L1-L5 lumbar screws and rods with revision L1-L5 posterior fusion by Dr. [name].    Postop had issues with fatigue cough, hypotension as well as confusion overnight on 01/24. BLE Dopplers ordered for follow-up on 01/22 and was negative for DVT and showed hypoechoic collection in left  popliteal fossa.  Foley placed due to ongoing issues with urinary retention.  Due to ongoing confusion and lethargy, antibiotics added due to concerns of UTI and oxycodone dose decreased due to concerns of side effects.  She started developing drainage from her wound on 01/28 as well as watery diarrhea and stool was negative for C. difficile.   She had rising white count to 19.9 with lactic acidosis on 01/31.  She was started on broad-spectrum antibiotics and continued to be febrile with Tmax 103.  ID consulted for input due to ongoing fevers  and recommended changing antibiotics to vancomycin, cefepime and metronidazole as well as repeat CTA chest/CT spine.  Chest CT showed multifocal patchy or base opacities throughout RUL, RML and bilateral lower lobes worrisome for multifocal pneumonia as well as acute/subacute right 9-10 rib Fx and CT lumbar spine showed subacute fracture through L3 vertebral body and increased air-fluid and posterior soft tissues unexpected in setting of recent surgery.  CT head was negative for acute changes.     [name] consulted due to worsening of respiratory status due to sepsis from acute meningitis and asked patient pneumonia and she required intubation on 02/01.  Neurology consulted due to concerns of seizures and EEG showed moderate diffuse encephalopathy.  MRI brain done showing punctate foci of acute ischemia right frontal lobe and diffusion abnormality within the occipital horn of left lateral ventricle with proteinaceous/purulent debris.  MRI spine showed L2-L5 fusion widely patent with decrease in bone marrow edema L3.  Dr. [name] recommended continuing pain and Flagyl for aspiration pneumonia as well as CNS penetration and given hardware exposure, she would require at least 6 weeks of treatment empirically to treat osteomyelitis.  Tracheal aspirate positive for Candida but not felt to be a pathogen in lung per ID.   Blood cultures x 2 negative.  Core track placed for nutritional support. She was briefly extubated on 02/06 but reintubated due to fatigue and increased WOB.  She underwent tracheostomy on 02/06, was extubated to ATC and is tolerating PMV.  Follow-up MRI brain 12/13 showed "dependent diffusion abnormality in occipital horn of left lateral ventricle with ependymal enhancement concerning for ventriculitis". MBS done on 02/12 and patient cleared for regular diet w/thins. She did have emesis with episode of desaturation and copious secretions on 02/13. She has had multiple voiding trials--last on 02/10 and  foley was replaced. Wound VAC changed today and reports of fecal management system in place due to ongoing loose stool and MASD/prevent wound contamination. Trach to be downsized to CFS# 4 today.    PT/OT has been working with patient who is showing participation and progression with therapy. She is able to transfer on LLE with +2 max assist. She continues to be limited by weakness, cognitive deficits requiring increased time to process as well as verbal and tactile cues to follow simple commands, displays poor attention and memory. CIR recommended due to functional decline.     Patient transferred to CIR on 09/03/2022 .    Patient currently requires total with basic self-care skills secondary to muscle weakness and muscle joint tightness, decreased cardiorespiratoy endurance and decreased oxygen support, and decreased sitting balance, decreased standing balance, decreased postural control, and decreased balance strategies.  Prior to hospitalization in November, patient was mod I with ADLs.  Patient will benefit from skilled intervention to increase independence with basic self-care skills prior to discharge home with care partner.  Anticipate patient will require 24 hour supervision and moderate physical assestance and follow  up home health.  OT - End of Session Activity Tolerance: Tolerates < 10 min activity, no significant change in vital signs Endurance Deficit: Yes OT Assessment Rehab Potential (ACUTE ONLY): Fair OT Patient demonstrates impairments in the following area(s): Balance;Behavior;Cognition;Endurance;Motor;Pain OT Basic ADL's Functional Problem(s): Eating;Grooming;Bathing;Dressing;Toileting OT Transfers Functional Problem(s): Toilet OT Additional Impairment(s): None OT Plan OT Intensity: Minimum of 1-2 x/day, 45 to 90 minutes OT Frequency: 5 out of 7 days OT Duration/Estimated Length of Stay: 28-30 days OT Treatment/Interventions: Balance/vestibular training;Cognitive  remediation/compensation;DME/adaptive equipment instruction;Discharge planning;Functional mobility training;Psychosocial support;Patient/family education;Pain management;Self Care/advanced ADL retraining;Therapeutic Activities;Therapeutic Exercise;UE/LE Strength taining/ROM;UE/LE Coordination activities OT Self Feeding Anticipated Outcome(s): independent OT Basic Self-Care Anticipated Outcome(s): mod A OT Toileting Anticipated Outcome(s): mod A OT Bathroom Transfers Anticipated Outcome(s): mod A to [facility] and or toilet OT Recommendation Patient destination: Home Follow Up Recommendations: Home health OT Equipment Recommended: To be determined   OT Evaluation Precautions/Restrictions  Precautions Precautions: Fall;Back Precaution Comments: trach, cortrack, flexiseal, wound vac on back, using PMSV, rectal tube, urinary catheter Required Braces or Orthoses: Other Brace Other Brace: prevlon boots Restrictions Weight Bearing Restrictions: No Other Position/Activity Restrictions: external rotation R hip at baseline at all times, RLE shorter than LLE, pt ambulated household distances PTA, w/c for community    Vital Signs Therapy Vitals Temp: 98.4 F (36.9 C) Pulse Rate: 88 Resp: 16 BP: (Abnormal) 160/91 Patient Position (if appropriate): Lying Oxygen Therapy SpO2: 100 % O2 Device: Room Air Pain Pain Assessment Pain Scale: 0-10 Pain Score: 9  Pain Type: Acute pain Pain Location: Hip Pain Orientation: Left Pain Descriptors / Indicators: Headache Pain Frequency: Constant Pain Onset: On-going Patients Stated Pain Goal: 0 Pain Intervention(s): Medication (See eMAR) Home Living/Prior Functioning Home Living Family/patient expects to be discharged to:: Private residence Living Arrangements: Spouse/significant other Available Help at Discharge: Family, Available 24 hours/day Type of Home: House Home Access: Ramped entrance Home Layout: One level Bathroom Shower/Tub: Clinical cytogeneticist: Handicapped height Additional Comments: Was Mod I prior to admit in 12/23 with RW short distances and wheelchair long disctances  Lives With: Spouse Prior Function Level of Independence: Independent with basic ADLs, Independent with homemaking with ambulation, Independent with gait, Independent with transfers, Requires assistive device for independence  Able to Take Stairs?: No Driving: No Vocation: Other (Comment) (Artist; with studio) Vision Baseline Vision/History: 1 Wears glasses Ability to See in Adequate Light: 0 Adequate Patient Visual Report: No change from baseline Vision Assessment?: No apparent visual deficits Perception  Perception: Within Functional Limits Praxis Praxis: Impaired Praxis Impairment Details: Initiation;Motor planning Cognition Cognition Overall Cognitive Status: Impaired/Different from baseline Arousal/Alertness: Awake/alert Orientation Level: Person;Place;Situation Person: Oriented Place: Oriented Situation: Oriented Memory: Impaired Memory Impairment: Retrieval deficit;Decreased short term memory;Decreased recall of new information;Storage deficit Decreased Short Term Memory: Verbal basic Attention: Focused;Sustained;Selective Focused Attention: Impaired Focused Attention Impairment: Verbal basic Sustained Attention: Impaired Sustained Attention Impairment: Verbal basic Selective Attention: Impaired Selective Attention Impairment: Verbal basic Awareness: Impaired Awareness Impairment: Emergent impairment Problem Solving: Appears intact Executive Function: Organizing;Self Monitoring;Self Correcting Organizing: Impaired Organizing Impairment: Verbal complex Initiating: Impaired Initiating Impairment: Verbal complex Self Monitoring: Impaired Self Monitoring Impairment: Verbal complex Self Correcting: Impaired Self Correcting Impairment: Verbal complex Behaviors: Poor frustration tolerance;Other (comment) (Reduced  filter) Safety/Judgment: Impaired Brief Interview for Mental Status (BIMS) Repetition of Three Words (First Attempt): 3 Temporal Orientation: Year: Correct Temporal Orientation: Month: Accurate within 5 days Temporal Orientation: Day: Correct Recall: "Sock": Yes, after cueing ("something to wear") Recall: "Blue": Yes, after cueing ("a color") Recall: "  Bed": No, could not recall BIMS Summary Score: 11 Sensation Sensation Light Touch: Appears Intact Hot/Cold: Appears Intact Proprioception: Appears Intact Stereognosis: Not tested Coordination Gross Motor Movements are Fluid and Coordinated: No Fine Motor Movements are Fluid and Coordinated: No Coordination and Movement Description: severe pain and debility affect smoothness of movement as well as overall mobility Heel Shin Test: unable to test Motor  Motor Motor: Abnormal postural alignment and control;Other (comment) Motor - Skilled Clinical Observations: severe debility from weakness and pain  Trunk/Postural Assessment  Cervical Assessment Cervical Assessment: Exceptions to [facility] (forward head, decreased postural strength when unsupported) Thoracic Assessment Thoracic Assessment: Exceptions to [facility] (scoliosis and kyphosis) Lumbar Assessment Lumbar Assessment: Exceptions to [facility] (scoliosis) Postural Control Postural Control: Deficits on evaluation (intermittent lean to L with awareness of lean but no initiation to correct without vc)  Balance Balance Balance Assessed: Yes Static Sitting Balance Static Sitting - Balance Support: Bilateral upper extremity supported;Feet supported Static Sitting - Level of Assistance: 4: Min assist Dynamic Sitting Balance Dynamic Sitting - Balance Support: Bilateral upper extremity supported Dynamic Sitting - Level of Assistance: 3: Mod assist Sitting balance - Comments: can hold posture but has intermittent bouts with lean, consistent weakness and lean to L with up to intermittent ModA to correct/  prevent. Static Standing Balance Static Standing - Balance Support: Bilateral upper extremity supported Static Standing - Level of Assistance: 1: +2 Total assist Extremity/Trunk Assessment RUE Assessment Passive Range of Motion (PROM) Comments: WFL Active Range of Motion (AROM) Comments: sh flexion to 120, can not fully extend elbows General Strength Comments: 3-/5 proximal,  3/5 distal LUE Assessment Passive Range of Motion (PROM) Comments: WFL Active Range of Motion (AROM) Comments: sh flexion to 120, can not fully extend elbows General Strength Comments: 3-/5 proximal, 3/5 distal  Care Tool Care Tool Self Care Eating   Eating Assist Level: Supervision/Verbal cueing    Oral Care    Oral Care Assist Level: Minimal Assistance - Patient > 75%    Bathing Bathing activity did not occur: Refused            Upper Body Dressing(including orthotics)   What is the patient wearing?: Hospital gown only   Assist Level: Total Assistance - Patient < 25%    Lower Body Dressing (excluding footwear)   What is the patient wearing?: Hospital gown only Assist for lower body dressing: Total Assistance - Patient < 25%    Putting on/Taking off footwear   What is the patient wearing?: Non-skid slipper socks Assist for footwear: Total Assistance - Patient < 25%       Care Tool Toileting Toileting activity Toileting Activity did not occur (Clothing management and hygiene only): N/A (no void or bm) (using catheter and rectal tube)       Care Tool Bed Mobility Roll left and right activity   Roll left and right assist level: Total Assistance - Patient < 25%    Sit to lying activity   Sit to lying assist level: 2 Helpers    Lying to sitting on side of bed activity   Lying to sitting on side of bed assist level: the ability to move from lying on the back to sitting on the side of the bed with no back support.: 2 Helpers     Care Tool Transfers Sit to stand transfer   Sit to stand assist  level: 2 Helpers    Chair/bed transfer Chair/bed transfer activity did not occur: Safety/medical concerns  Materials engineer transfer activity did not occur: Safety/medical concerns       Care Tool Cognition  Expression of Ideas and Wants Expression of Ideas and Wants: 3. Some difficulty - exhibits some difficulty with expressing needs and ideas (e.g, some words or finishing thoughts) or speech is not clear  Understanding Verbal and Non-Verbal Content Understanding Verbal and Non-Verbal Content: 3. Usually understands - understands most conversations, but misses some part/intent of message. Requires cues at times to understand   Memory/Recall Ability Memory/Recall Ability : Current season;That he or she is in a hospital/hospital unit   Refer to Care Plan for [place] 1 OT Short Term Goal 1 (Week 1): Pt will tolerate sitting EOB for 10 minutes with CGA to engage in UB bathing. OT Short Term Goal 2 (Week 1): Pt will be able to bridge hips in bed with mod A to enable placement of pad or brief as needed. OT Short Term Goal 3 (Week 1): Pt will be able to use BUE to wash UB with mod A. OT Short Term Goal 4 (Week 1): Pt will be able to push to stand with max A of 2 and push up to full stand for at least 30 seconds. OT Short Term Goal 5 (Week 1): Pt will demonstrate improved attention to focus on 1 ADL task with min cues.  Recommendations for other services: None    Skilled Therapeutic Intervention ADL ADL ADL Comments: unable to complete on eval. Pt refused bathing as she was determined to watch a basketball game, can only use hospital gown at this time due to multiple lines, rectal tube. Mobility  Bed Mobility Bed Mobility: Rolling Right;Rolling Left;Sit to Sidelying Right;Supine to Sit;Sit to Supine Rolling Right: Total Assistance - Patient < 25% Rolling Left: Total Assistance - Patient < 25% Supine to Sit: 2 Helpers Sit to Supine: 2 Helpers Sit  to Sidelying Right: 2 Helpers Transfers Sit to Stand: 2 Helpers Stand to Sit: 2 Helpers   Pt seen for initial evaluation.  Pt's spouse present. Explained role of OT, discussed POC and potential LOS, along with pt's goals.  Pt very distracted this session as the [place] basketball game was on and she was determined to watch it.  Her spouse turned the TV off and I told her she could turn it back on when finished. Pt very distracted perseverating on the game.  Initiation of ADLs not possible due to coretrack and PICC lines were connected along with rectal tube.  With +2 A pt did come to EOB with total A, sat EOB with min A with L lean (pt states this is how she is "shaped") (spouse states this is more of a lean than before).  Tilted bed in trendelenberg to offset her lean.  Pt refusing to work on standing, still upset about the game. Negotiated that if she did 1 stand she could lay back in bed to watch game and do arm exercises at the same time.  Pt agreed. Pt needed total A to stand with +2.  Pt unable to come up fully and only lifted hips slightly. Only tolerated 5 seconds when the goal was 10.  Pt moved back to supine.  Her rectal tube had leaked. Offered pt a bath to cleanse her lower LB, pt refused wanting to wait. Covered area of bed with a towel.  Pt set up in bed and worked with 1 lb dowel bar on arm exercises with max cues  and prompting. Pt unable to reach B arms into full extension. Showed her how to clasp hands to reach arms for a room exercise. She can only do 3-4 reps and then she is fatigued.  Pt resting in bed with all needs met. Spouse in room with pt.   Discharge Criteria: Patient will be discharged from OT if patient refuses treatment 3 consecutive times without medical reason, if treatment goals not met, if there is a change in medical status, if patient makes no progress towards goals or if patient is discharged from hospital.  The above assessment, treatment plan, treatment alternatives and  goals were discussed and mutually agreed upon: by patient and by family  Aashna Matson 09/04/2022, 4:03 PM

## 2022-09-05 DIAGNOSIS — E871 Hypo-osmolality and hyponatremia: Secondary | ICD-10-CM | POA: Diagnosis not present

## 2022-09-05 DIAGNOSIS — R197 Diarrhea, unspecified: Secondary | ICD-10-CM | POA: Diagnosis not present

## 2022-09-05 DIAGNOSIS — M4626 Osteomyelitis of vertebra, lumbar region: Secondary | ICD-10-CM | POA: Diagnosis not present

## 2022-09-05 DIAGNOSIS — R600 Localized edema: Secondary | ICD-10-CM | POA: Diagnosis not present

## 2022-09-05 LAB — GLUCOSE, CAPILLARY
Glucose-Capillary: 122 mg/dL — ABNORMAL HIGH (ref 70–99)
Glucose-Capillary: 133 mg/dL — ABNORMAL HIGH (ref 70–99)
Glucose-Capillary: 149 mg/dL — ABNORMAL HIGH (ref 70–99)
Glucose-Capillary: 102 mg/dL — ABNORMAL HIGH (ref 70–99)
Glucose-Capillary: 98 mg/dL (ref 70–99)

## 2022-09-05 MED ORDER — OXYCODONE HCL 5 MG PO TABS
5.0000 mg | ORAL_TABLET | Freq: Four times a day (QID) | ORAL | Status: DC
  Administered 2022-09-05 – 2022-09-06 (×3): 5 mg
  Filled 2022-09-05 (×4): qty 1

## 2022-09-05 MED ORDER — OXYCODONE HCL 5 MG PO TABS
2.5000 mg | ORAL_TABLET | ORAL | Status: DC | PRN
  Administered 2022-09-06: 2.5 mg
  Filled 2022-09-05: qty 1

## 2022-09-05 MED ORDER — OXYCODONE HCL 5 MG PO TABS: 5.0000 mg | ORAL_TABLET | Freq: Four times a day (QID) | ORAL | Status: DC

## 2022-09-05 NOTE — Progress Notes (Addendum)
PROGRESS NOTE   Subjective/Complaints:  Pt had another rough night because of wound vac issues-- had multiple adjustments from 11pm-1am and therefore she didn't sleep well. But at least there weren't other interruptions from other things-- she's thankful for the adjustments we made yesterday.  Ate pretty well yesterday-- is having food brought from outside, had a [employer] salad yesterday and really enjoyed it. She doesn't like the hospital food but if she's allowed to eat outside food, then she will eat more. She's hoping to have the Cortrak removed. It was unable to be unclogged yesterday so it hasn't been running. Would like it disconnected, at least, until decision is made about removing it.  Pain control is doing ok-- thinks that scheduled pain meds would be easier than having to ask for it, for more consistent/steady control.  LE swelling is about the same today.  Still having diarrhea, not sure if it's slowed down, but charted stool output 60m.  Doesn't like the potassium supplements but can't tolerate the big pill. Family wondering if a bed/better chair could be provided since they're staying the night with her.  Otherwise, no additional complaints or concerns.    ROS: +diarrhea, +back pain, +insomnia/poor sleep, +LE swelling. Denies fevers, chills, CP, SOB, abd pain, N/V/C, new/worsening paresthesias/weakness, or any other complaints at this time.    Objective:   DG CHEST PORT 1 VIEW  Result Date: 09/03/2022 CLINICAL DATA:  Borderline low O2 saturation. EXAM: PORTABLE CHEST 1 VIEW COMPARISON:  08/24/2022. FINDINGS: The heart size and mediastinal contours are within normal limits. Tracheostomy tube terminates 3.3 cm above the carina. A right internal jugular central venous catheter terminates over the right atrium. Mild airspace disease is noted at the lung bases. There is a small left pleural effusion. No pneumothorax.  Shoulder arthroplasty changes are noted on the left. There is partial visualization of lumbar spinal fusion hardware. An enteric tube courses over the stomach and out of the field of view. IMPRESSION: 1. Mild atelectasis or infiltrate at the lung bases. 2. Small left pleural effusion. Electronically Signed   By: LBrett FairyM.D.   On: 09/03/2022 21:13   Recent Labs    09/03/22 0405 09/04/22 0145  WBC 7.8 7.3  HGB 8.4* 8.6*  HCT 25.3* 25.8*  PLT 403* 359   Recent Labs    09/03/22 0405 09/04/22 0145  NA 130* 129*  K 3.2* 4.0  CL 94* 95*  CO2 26 26  GLUCOSE 137* 96  BUN 7* 5*  CREATININE <0.30* <0.30*  CALCIUM 7.9* 8.3*    Intake/Output Summary (Last 24 hours) at 09/05/2022 1239 Last data filed at 09/05/2022 [id number] per 24 hour  Intake 1900.31 ml  Output 4495 ml  Net -2594.69 ml        Physical Exam: Vital Signs Blood pressure (!) 152/85, pulse 91, temperature 98.5 F (36.9 C), temperature source Oral, resp. rate 18, height 5' 1"$  (1.549 m), weight 71.9 kg, SpO2 99 %.  Constitutional:  thin, looks tired today, NAD HENT: [place]/AT, Cortrak in place in L nare, MMM.   Eyes: EOMI, conjunctiva clear, PERRL Neck:     Comments: #6 trach in place. Pt able  to speak around trach fairly well. Excellent phonation with PMV Cardiovascular: RRR, soft murmur heard (VSD), 1+ BLE swelling-stable Pulmonary: CTAB no w/r/r, no increased WOB Abdominal: Soft, NTND, +BS throughout, no r/g/r  Genitourinary:    Comments: Rectal tube and foley in place. Rectal tube with scant brown stool in tubing  PRIOR EXAM: Musculoskeletal:        General: Swelling present.     Cervical back: Normal range of motion.     Comments: Right leg discrepancy with weakness and leg rotated outward. Dependent edema Right forearm and hand and left forearm.   Skin:    Comments: Well healed old right thigh incision. Wound VAC in place, low back  Neurological:     Mental Status: She is alert and oriented to person,  place, and time.     Comments: Alert and oriented x 3 (needed some help with day) much improved insight and awareness. Garza Memory. Normal language and speech. Cranial nerve exam unremarkable. UE 4/5 prox to distal. RLE 2-/5 prox to 4/5 distally. LLE 2+/5 prox to 4/5 distally. Senses pain in all 4 limbs. No abnl resting tone.   Psychiatric:        Mood and Affect: Mood normal.        Behavior: Behavior normal.   Assessment/Plan: 1. Functional deficits which require 3+ hours per day of interdisciplinary therapy in a comprehensive inpatient rehab setting. Physiatrist is providing close team supervision and 24 hour management of active medical problems listed below. Physiatrist and rehab team continue to assess barriers to discharge/monitor patient progress toward functional and medical goals  Care Tool:  Bathing  Bathing activity did not occur: Refused           Bathing assist       Upper Body Dressing/Undressing Upper body dressing   What is the patient wearing?: Hospital gown only    Upper body assist Assist Level: Total Assistance - Patient < 25%    Lower Body Dressing/Undressing Lower body dressing      What is the patient wearing?: Hospital gown only     Lower body assist Assist for lower body dressing: Total Assistance - Patient < 25%     Toileting Toileting Toileting Activity did not occur Landscape architect and hygiene only): N/A (no void or bm) (using catheter and rectal tube)  Toileting assist       Transfers Chair/bed transfer  Transfers assist  Chair/bed transfer activity did not occur: Safety/medical concerns        Locomotion Ambulation   Ambulation assist   Ambulation activity did not occur: Safety/medical concerns          Walk 10 feet activity   Assist  Walk 10 feet activity did not occur: Safety/medical concerns        Walk 50 feet activity   Assist Walk 50 feet with 2 turns activity did not occur: Safety/medical  concerns         Walk 150 feet activity   Assist Walk 150 feet activity did not occur: Safety/medical concerns         Walk 10 feet on uneven surface  activity   Assist Walk 10 feet on uneven surfaces activity did not occur: Safety/medical concerns         Wheelchair     Assist Is the patient using a wheelchair?: Yes Type of Wheelchair: Manual Wheelchair activity did not occur: Safety/medical concerns         Wheelchair 50 feet with 2 turns  activity    Assist    Wheelchair 50 feet with 2 turns activity did not occur: Safety/medical concerns       Wheelchair 150 feet activity     Assist  Wheelchair 150 feet activity did not occur: Safety/medical concerns       Blood pressure (!) 152/85, pulse 91, temperature 98.5 F (36.9 C), temperature source Oral, resp. rate 18, height 5' 1"$  (1.549 m), weight 71.9 kg, SpO2 99 %.  Medical Problem List and Plan: 1. Functional deficits secondary to osteomyelitis,infection of lumbar surgical site complicated by meningitis/ventriculitis.             -pt also with significant deconditioning             -patient may not yet shower             -ELOS/Goals: 20-27 days, supervision to min assist with PT, OT, SLP  -Continue CIR 2.  Antithrombotics: -DVT/anticoagulation:  Pharmaceutical: Lovenox 315m QD             -antiplatelet therapy: N/a 3. Pain Management: Butrans 538m/hr patch for pain control w/ Flexeril 15m48mID, Tylenol PRN, Oxycodone 7.15mg97mh PRN  -09/04/22 increased flexeril to 7.15mg 70m, monitor for effect -09/05/22 still having some pain, will schedule Oxycodone 15mg q83mand leave 2.15mg q483mRN dosing-- she's been getting at least 115mg da51mwith the PRNs, but doesn't feel adequate control because she's having to ask for it and feels overwhelmed by all the meds/lines/tubes/etc. Hopefully scheduling will help-- monitor for improvement.   4. Mood/Behavior/Sleep: LCSW to follow for evaluation and support.              --Trazodone 100mg at 62mime for  insomnia/mood  -09/04/22 placed sign on door to minimize interruptions 11p-5a; changed CBG checks as below to minimize interruptions             -antipsychotic agents:  Luvox 100mg QHS 53mLamictal 200mg QD  -26minue Aricept 10mg QD 5. 43mopsych/cognition: This patient is not fully capable of making decisions on her own behalf. 6. Skin/Wound Care: WOC to follow for wound VAC changes --MASD managed by fecal system-- flexiseal clogged 09/04/22 AM but nursing states it's now working; monitor--might be able to d/c soon? 7. Fluids/Electrolytes/Nutrition: Monitor I/O. Continue tube feeds-->likely contributing to diarrhea             -pt had unrecorded intake today             -both she and daughters would like to transition from NGT             -will cut TF in half tonight             -monitor intake closely, continue vitamins             -09/04/22 CMP fairly stable, monitor weekly labs starting 09/06/22 -Cortrak clogged this morning 09/04/22, RD made recs for unclogging, nursing to attempt to continue unclogging overnight-- if unsuccessful, nursing will let me know, and pt will need cortrak team consult on Monday; since it's still not functional, will give gentle IVFs overnight, recheck Cortrak status in AM; push PO intake today -09/05/22 PO intake picking up with outside food, Cortrak still clogged-- will unhook but leave in place for now, weekday team to decide on necessity, pt hopeful for removal. IF Cortrak needed, place consult for Cortrak team to assess unclogging it. Doubt need for IVFs tonight, encourage PO fluids (has multiple beverages at bedside).  8.Sepsis w/ osteomyelitis/meningitis/encephalitis:  Continue Cefepime 2g q8h, Vanc 1260m q12h, and Flagyl 5046mq8h for at least 6 weeks             --end date per ID. Will follow up on plan during admit  9. H/o PVT:  Monitor BP/heart rate TID--on coreg 12.55m22mID, Labetaolol 78m155mh PRN  -09/04/22 BP/HR fairly  well controlled, cont regimen, monitor  -09/05/22 BP stable, could be pain mediated as well; monitor for now Vitals:   09/03/22 1841 09/03/22 1842 09/03/22 2017 09/04/22 0341  BP: (!) 146/84 (!) 146/84 (!) 148/83 (!) 148/82   09/04/22 1445 09/04/22 2027 09/05/22 0618  BP: (!) 160/91 (!) 154/83 (!) 152/85    10. Urinary retention: Has failed 4 voiding trails. Has had foley since 12/23             -maintain foley cath, foley mgt per protocol -- Continue Flomax 0.4mg 36m. Will d/c urecholine.  11. VDRF s/p trach: Tolerating PMSV well -I ordered downsizing to CFS # 4 today--tolerated well -continue ATC w/ pulmonary hygiene -begin day time plugging of trach tomorrow. Decannulate soon. 12. T2DM: Monitor BS ac/hs and use SSI for elevated BS.              --continue tube feeds with supplements between meals due to poor intake             --continue liberalized diet for now. -09/04/22 CBGs looking great, Changed SSI and CBG checks to TID AC + QHS to minimize interruptions overnight, since tube feeds are decreased/temporarily stopped currently; if tube feeds continued longer term, consider changing back to q4h if desired -09/05/22 CBGs look great, cont TID AC+QHS unless TFs resumed.  CBG (last 3)  Recent Labs    09/05/22 0620 09/05/22 0808 09/05/22 1135  GLUCAP 122* 133* 149*    13. Hypokalemia: Recurrent likely due to diarrhea/intake.              --will add standing dose BID -->recheck labs on 02/17 and 02/19  -09/04/22 K+ 4.0, continue Klor 20mEq2m, monitor on weekly labs -09/05/22 doesn't like Klor packet but no great alternative (Phos-Nak would have to be heavily dosed to get equivalent K+, so only other alternative is IV-- will hold off for now) 14. Anemia of critical illness: Hgb 7.5-8.9 range -09/04/22 Hgb stable at 8.6, continue to monitor on weekly labs starting 09/06/22 15. Hyponatremia/hypochloremia: Improving 125/87-->130/94.  -09/04/22 Na 129, Cl 95, both stable, continue salt tabs  1g BID -Cortisol testing done during hospitalization (13.8 on 08/30/22), was normal, but daughter and pt want to make sure this is followed up--defer to weekday team regarding ongoing ?Addison's testing 16. Encephalopathy/Delirium: Continues to have intermittent confusion--but much improved compared to 3 days ago per family.  --Continue delirium precautions. 17. Loose stools likely d/t TF and abx.             -reducing TF as above.  -add probiotics and fiber-- fiber changed to Nutrisource per RD on 09/04/22 since this should cause less clogging of Cortrak -Lomotil 1-2 tabs QID PRN -09/05/22 Cortrak still clogged, taking PO better, and diarrhea seems to be slowing-- reassess needs depending on Cortrak removal timing 18. Hypothyroidism: continue synthroid 88mcg 76m9. Chronic combined CHF, BLE Swelling: recent 2D echo done 08/20/2022 showed apical hypokinesis, LVEF 40-45%, RV function was reduced. Intermittently diuresed during hospitalization, most recently given 40mg IV66mix on 09/03/22 -09/04/22 noted to still have BLE swelling, start 20mg PO 82mx and monitor lytes/BP/swelling -09/05/22 stable swelling,  no worse today; cont lasix; daily weights ordered but not done (also, weight will be skewed because of all her equipment)  Filed Weights   09/03/22 1941  Weight: 71.9 kg     I spent >50 minutes with this patient and her family, going through pain control regimen, Cortrak discussions, medication changes, requests for bed/chair and discussing this with nursing.   LOS: 2 days A FACE TO [place] 09/05/2022, 12:39 PM

## 2022-09-05 NOTE — Progress Notes (Signed)
Patient eating well, family bringing patient outside food. Patient with great appetite, ate 100% of her [employer] meal. Patient taking pills whole one at a time without difficulty. Patient asking to get Cortrak removed. MD order placed to keep cortak in at this time.

## 2022-09-06 DIAGNOSIS — R1310 Dysphagia, unspecified: Secondary | ICD-10-CM

## 2022-09-06 DIAGNOSIS — E871 Hypo-osmolality and hyponatremia: Secondary | ICD-10-CM | POA: Diagnosis not present

## 2022-09-06 DIAGNOSIS — E876 Hypokalemia: Secondary | ICD-10-CM

## 2022-09-06 DIAGNOSIS — Z930 Tracheostomy status: Secondary | ICD-10-CM | POA: Diagnosis not present

## 2022-09-06 DIAGNOSIS — M4626 Osteomyelitis of vertebra, lumbar region: Secondary | ICD-10-CM | POA: Diagnosis not present

## 2022-09-06 LAB — CBC
HCT: 28.6 % — ABNORMAL LOW (ref 36.0–46.0)
Hemoglobin: 9.1 g/dL — ABNORMAL LOW (ref 12.0–15.0)
MCH: 32.5 pg (ref 26.0–34.0)
MCHC: 31.8 g/dL (ref 30.0–36.0)
MCV: 102.1 fL — ABNORMAL HIGH (ref 80.0–100.0)
Platelets: 398 10*3/uL (ref 150–400)
RBC: 2.8 MIL/uL — ABNORMAL LOW (ref 3.87–5.11)
RDW: 19.6 % — ABNORMAL HIGH (ref 11.5–15.5)
WBC: 7.1 10*3/uL (ref 4.0–10.5)
nRBC: 0 % (ref 0.0–0.2)

## 2022-09-06 LAB — BASIC METABOLIC PANEL
Anion gap: 12 (ref 5–15)
BUN: 5 mg/dL — ABNORMAL LOW (ref 8–23)
CO2: 24 mmol/L (ref 22–32)
Calcium: 8.7 mg/dL — ABNORMAL LOW (ref 8.9–10.3)
Chloride: 96 mmol/L — ABNORMAL LOW (ref 98–111)
Creatinine, Ser: <0.3 mg/dL — ABNORMAL LOW (ref 0.44–1.00)
Glucose, Bld: 109 mg/dL — ABNORMAL HIGH (ref 70–99)
Potassium: 3.1 mmol/L — ABNORMAL LOW (ref 3.5–5.1)
Sodium: 132 mmol/L — ABNORMAL LOW (ref 135–145)

## 2022-09-06 LAB — GLUCOSE, CAPILLARY
Glucose-Capillary: 113 mg/dL — ABNORMAL HIGH (ref 70–99)
Glucose-Capillary: 170 mg/dL — ABNORMAL HIGH (ref 70–99)
Glucose-Capillary: 118 mg/dL — ABNORMAL HIGH (ref 70–99)

## 2022-09-06 MED ORDER — NUTRISOURCE FIBER PO PACK
1.0000 | PACK | Freq: Two times a day (BID) | ORAL | Status: DC
  Administered 2022-09-06 – 2022-10-11 (×20): 1 via ORAL
  Filled 2022-09-06 (×51): qty 1

## 2022-09-06 MED ORDER — CHLORHEXIDINE GLUCONATE CLOTH 2 % EX PADS
6.0000 | MEDICATED_PAD | Freq: Every day | CUTANEOUS | Status: DC
  Administered 2022-09-06 – 2022-09-07 (×2): 6 via TOPICAL

## 2022-09-06 MED ORDER — FAMOTIDINE 20 MG PO TABS
20.0000 mg | ORAL_TABLET | Freq: Two times a day (BID) | ORAL | Status: DC
  Administered 2022-09-06 – 2022-10-13 (×74): 20 mg via ORAL
  Filled 2022-09-06 (×74): qty 1

## 2022-09-06 MED ORDER — DIPHENOXYLATE-ATROPINE 2.5-0.025 MG PO TABS: 1.0000 | ORAL_TABLET | Freq: Four times a day (QID) | ORAL | Status: DC | PRN

## 2022-09-06 MED ORDER — OXYCODONE HCL 5 MG PO TABS
2.5000 mg | ORAL_TABLET | ORAL | Status: DC | PRN
  Administered 2022-09-06 – 2022-09-17 (×22): 2.5 mg via ORAL
  Filled 2022-09-06 (×22): qty 1

## 2022-09-06 MED ORDER — POTASSIUM CHLORIDE 20 MEQ PO PACK
40.0000 meq | PACK | Freq: Two times a day (BID) | ORAL | Status: DC
  Administered 2022-09-06: 40 meq via ORAL
  Filled 2022-09-06: qty 2

## 2022-09-06 MED ORDER — POTASSIUM CHLORIDE CRYS ER 20 MEQ PO TBCR: 40.0000 meq | EXTENDED_RELEASE_TABLET | Freq: Once | ORAL | Status: DC

## 2022-09-06 MED ORDER — LEVOTHYROXINE SODIUM 88 MCG PO TABS
88.0000 ug | ORAL_TABLET | Freq: Every day | ORAL | Status: DC
  Administered 2022-09-07 – 2022-10-13 (×37): 88 ug via ORAL
  Filled 2022-09-06 (×37): qty 1

## 2022-09-06 MED ORDER — SACCHAROMYCES BOULARDII 250 MG PO CAPS
250.0000 mg | ORAL_CAPSULE | Freq: Two times a day (BID) | ORAL | Status: DC
  Administered 2022-09-06 – 2022-10-13 (×74): 250 mg via ORAL
  Filled 2022-09-06 (×74): qty 1

## 2022-09-06 MED ORDER — PROCHLORPERAZINE MALEATE 5 MG PO TABS
5.0000 mg | ORAL_TABLET | Freq: Four times a day (QID) | ORAL | Status: DC | PRN
  Administered 2022-09-17: 5 mg via ORAL
  Administered 2022-09-17 – 2022-10-09 (×6): 10 mg via ORAL
  Filled 2022-09-06 (×3): qty 2
  Filled 2022-09-06: qty 1
  Filled 2022-09-06 (×4): qty 2

## 2022-09-06 MED ORDER — LAMOTRIGINE 100 MG PO TABS
200.0000 mg | ORAL_TABLET | Freq: Every day | ORAL | Status: DC
  Administered 2022-09-07 – 2022-10-13 (×37): 200 mg via ORAL
  Filled 2022-09-06 (×37): qty 2

## 2022-09-06 MED ORDER — FUROSEMIDE 20 MG PO TABS
20.0000 mg | ORAL_TABLET | Freq: Every day | ORAL | Status: DC
  Administered 2022-09-07 – 2022-10-13 (×37): 20 mg via ORAL
  Filled 2022-09-06 (×38): qty 1

## 2022-09-06 MED ORDER — GUAIFENESIN-DM 100-10 MG/5ML PO SYRP: 5.0000 mL | ORAL_SOLUTION | Freq: Four times a day (QID) | ORAL | Status: DC | PRN

## 2022-09-06 MED ORDER — TRAZODONE HCL 50 MG PO TABS
100.0000 mg | ORAL_TABLET | Freq: Every day | ORAL | Status: DC
  Administered 2022-09-06 – 2022-09-17 (×12): 100 mg via ORAL
  Administered 2022-09-18 – 2022-09-19 (×2): 50 mg via ORAL
  Administered 2022-09-20 – 2022-10-12 (×23): 100 mg via ORAL
  Filled 2022-09-06 (×37): qty 2

## 2022-09-06 MED ORDER — ACETAMINOPHEN 325 MG PO TABS
325.0000 mg | ORAL_TABLET | ORAL | Status: DC | PRN
  Administered 2022-09-06: 325 mg via ORAL
  Administered 2022-09-07 – 2022-09-11 (×10): 650 mg via ORAL
  Administered 2022-09-12: 325 mg via ORAL
  Administered 2022-09-12 – 2022-09-13 (×2): 650 mg via ORAL
  Filled 2022-09-06 (×15): qty 2

## 2022-09-06 MED ORDER — ALUM & MAG HYDROXIDE-SIMETH 200-200-20 MG/5ML PO SUSP
30.0000 mL | ORAL | Status: DC | PRN
  Administered 2022-10-02: 30 mL via ORAL
  Filled 2022-09-06: qty 30

## 2022-09-06 MED ORDER — POTASSIUM CHLORIDE 20 MEQ PO PACK
40.0000 meq | PACK | Freq: Once | ORAL | Status: AC
  Administered 2022-09-06: 40 meq via ORAL
  Filled 2022-09-06: qty 2

## 2022-09-06 MED ORDER — PROCHLORPERAZINE EDISYLATE 10 MG/2ML IJ SOLN: 5.0000 mg | Freq: Four times a day (QID) | INTRAMUSCULAR | Status: DC | PRN

## 2022-09-06 MED ORDER — MELATONIN 5 MG PO TABS
5.0000 mg | ORAL_TABLET | Freq: Every evening | ORAL | Status: DC | PRN
  Administered 2022-09-08 – 2022-09-16 (×5): 5 mg via ORAL
  Filled 2022-09-06 (×6): qty 1

## 2022-09-06 MED ORDER — OXYCODONE HCL 5 MG PO TABS
5.0000 mg | ORAL_TABLET | Freq: Four times a day (QID) | ORAL | Status: DC
  Administered 2022-09-06 – 2022-10-10 (×134): 5 mg via ORAL
  Filled 2022-09-06 (×136): qty 1

## 2022-09-06 MED ORDER — PROCHLORPERAZINE 25 MG RE SUPP: 12.5000 mg | Freq: Four times a day (QID) | RECTAL | Status: DC | PRN

## 2022-09-06 MED ORDER — SODIUM CHLORIDE 1 G PO TABS
1.0000 g | ORAL_TABLET | Freq: Two times a day (BID) | ORAL | Status: DC
  Administered 2022-09-06 – 2022-10-13 (×74): 1 g via ORAL
  Filled 2022-09-06 (×70): qty 1

## 2022-09-06 MED ORDER — CYCLOBENZAPRINE HCL 5 MG PO TABS
7.5000 mg | ORAL_TABLET | Freq: Three times a day (TID) | ORAL | Status: DC
  Administered 2022-09-06 – 2022-09-28 (×66): 7.5 mg via ORAL
  Filled 2022-09-06 (×67): qty 2

## 2022-09-06 MED ORDER — FLUVOXAMINE MALEATE 100 MG PO TABS
100.0000 mg | ORAL_TABLET | Freq: Every day | ORAL | Status: DC
  Administered 2022-09-07 – 2022-10-12 (×36): 100 mg via ORAL
  Filled 2022-09-06 (×37): qty 1

## 2022-09-06 MED ORDER — DONEPEZIL HCL 10 MG PO TABS
10.0000 mg | ORAL_TABLET | Freq: Every day | ORAL | Status: DC
  Administered 2022-09-07 – 2022-10-13 (×37): 10 mg via ORAL
  Filled 2022-09-06 (×37): qty 1

## 2022-09-06 MED ORDER — POTASSIUM CHLORIDE 20 MEQ PO PACK: 40.0000 meq | PACK | Freq: Two times a day (BID) | ORAL | Status: DC

## 2022-09-06 MED ORDER — CARVEDILOL 12.5 MG PO TABS
12.5000 mg | ORAL_TABLET | Freq: Two times a day (BID) | ORAL | Status: DC
  Administered 2022-09-06 – 2022-09-10 (×8): 12.5 mg via ORAL
  Filled 2022-09-06 (×8): qty 1

## 2022-09-06 MED ORDER — ADULT MULTIVITAMIN W/MINERALS CH
1.0000 | ORAL_TABLET | Freq: Every day | ORAL | Status: DC
  Administered 2022-09-07 – 2022-10-13 (×37): 1 via ORAL
  Filled 2022-09-06 (×37): qty 1

## 2022-09-06 NOTE — Progress Notes (Signed)
Patient is eating 100% of her meals what her family brings. Family brought pizza for her dinner this evening, patient eating great amount. Patient swallowing well, taking all her pills whole with water. Patient is saturating 99-100% with trach capped. Respiratory came by to inform this nurse that patient will need to be uncapped with oxygen again at bedtime (near 10 pm per therapist).

## 2022-09-06 NOTE — IPOC Note (Signed)
Overall Plan of Care [facility]) Patient Details Name: [name] MRN: [id number] DOB: 1955/10/04  Admitting Diagnosis: Infection of lumbar spine [facility])  Hospital Problems: Principal Problem:   Infection of lumbar spine ([place])     Functional Problem List: Nursing Bladder, Bowel, Safety, Endurance, Medication Management, Pain, Skin Integrity  PT Balance, Behavior, Edema, Endurance, Motor, Nutrition, Pain, Perception, Safety, Sensory, Skin Integrity  OT Balance, Behavior, Cognition, Endurance, Motor, Pain  SLP Cognition, Behavior  TR         Basic ADL's: OT Eating, Grooming, Bathing, Dressing, Toileting     Advanced  ADL's: OT       Transfers: PT Bed Mobility, Bed to Chair, Car, Chief Operating Officer: PT Ambulation, Emergency planning/management officer, Stairs     Additional Impairments: OT None  SLP Social Cognition, Swallowing, Communication expression Technical sales engineer, Memory, Attention, Awareness  TR      Anticipated Outcomes Item Anticipated Outcome  Self Feeding independent  Swallowing  indep   Basic self-care  mod A  Toileting  mod A   Bathroom Transfers mod A to BSC and or toilet  Bowel/Bladder  manage bowel w mod I and bladder w total assist  Transfers  MinA  Locomotion  ModA for ambulation  Communication  indep  Cognition  minA  Pain  < 4 with prns  Safety/Judgment  manage w cues   Therapy Plan: PT Intensity: Minimum of 1-2 x/day ,45 to 90 minutes PT Frequency: 5 out of 7 days PT Duration Estimated Length of Stay: 27-30 days OT Intensity: Minimum of 1-2 x/day, 45 to 90 minutes OT Frequency: 5 out of 7 days OT Duration/Estimated Length of Stay: 28-30 days SLP Intensity: Minumum of 1-2 x/day, 30 to 90 minutes SLP Frequency: 3 to 5 out of 7 days SLP Duration/Estimated Length of Stay: 20-27 days   Team Interventions: Nursing Interventions Bladder Management, Medication Management, Discharge Planning, Bowel Management, Skin Care/Wound  Management, Disease Management/Prevention, Pain Management, Patient/Family Education, Dysphagia/Aspiration Precaution Training  PT interventions Ambulation/gait training, Discharge planning, Functional mobility training, Psychosocial support, Therapeutic Activities, Visual/perceptual remediation/compensation, Wheelchair propulsion/positioning, Therapeutic Exercise, Skin care/wound management, Neuromuscular re-education, Disease management/prevention, Training and development officer, Cognitive remediation/compensation, DME/adaptive equipment instruction, Pain management, Splinting/orthotics, UE/LE Strength taining/ROM, Community reintegration, Technical sales engineer stimulation, Patient/family education, IT trainer, UE/LE Coordination activities  OT Interventions Training and development officer, Cognitive remediation/compensation, DME/adaptive equipment instruction, Discharge planning, Functional mobility training, Psychosocial support, Patient/family education, Pain management, Self Care/advanced ADL retraining, Therapeutic Activities, Therapeutic Exercise, UE/LE Strength taining/ROM, UE/LE Coordination activities  SLP Interventions Cognitive remediation/compensation, Internal/external aids, Cueing hierarchy, Patient/family education, Therapeutic Activities, Functional tasks, Therapeutic Exercise  TR Interventions    SW/CM Interventions Discharge Planning, Psychosocial Support, Patient/Family Education   Barriers to Discharge MD  Medical stability and Nutritional means  Nursing Trach, Nutrition means, Wound Care, Home environment access/layout, Decreased caregiver support, IV antibiotics, New oxygen 1 level ramped entry w spouse;was at [facility] after last 2 admits since 12/23 and family feel that she did not have enuugh medical and nursing oversight of her complex needs. Family work but will arrange schedule and hire assist  PT Inaccessible home environment, Decreased caregiver support, Home environment  access/layout, Trach, IV antibiotics, Incontinence, Wound Care, Behavior, Nutrition means    OT      SLP      SW       Team Discharge Planning: Destination: PT-Home ,OT- Home , SLP-Home Projected Follow-up: PT-Home health PT, 24 hour supervision/assistance, OT-  Home health OT, SLP-Outpatient SLP,  Home Health SLP Projected Equipment Needs: PT-To be determined, OT- To be determined, SLP-None Equipment Details: PT- , OT-  Patient/family involved in discharge planning: PT- Patient, Family member/caregiver,  OT-Patient, Family member/caregiver, SLP-Patient, Family member/caregiver  MD ELOS: 20-27 Medical Rehab Prognosis:  Good Assessment: The patient has been admitted for CIR therapies with the diagnosis of osteomyelitis,infection of lumbar surgical site complicated by meningitis/ventriculitis. . The team will be addressing functional mobility, strength, stamina, balance, safety, adaptive techniques and equipment, self-care, bowel and bladder mgt, patient and caregiver education. Goals have been set at supervision to min assist with PT, OT, SLP . Anticipated discharge destination is home.        See Team Conference Notes for weekly updates to the plan of care

## 2022-09-06 NOTE — Progress Notes (Signed)
Inpatient Rehabilitation Care Coordinator Assessment and Plan Patient Details  Name: [name] MRN: [id number] Date of Birth: 11-Jan-1956  Today's Date: 09/06/2022  Hospital Problems: Principal Problem:   Infection of lumbar spine [facility])  Past Medical History:  Past Medical History:  Diagnosis Date   Anemia    Anxiety    Arthritis    Bipolar disorder ([place])    Depression    GERD (gastroesophageal reflux disease)    Heart murmur    "related to VSD"   High cholesterol    History of blood transfusion    "related to OR" (08/19/2016)   History of hiatal hernia    Hypertension    Hyperthyroidism    Mild cognitive impairment 09/06/2018   Paroxysmal ventricular tachycardia (HCC)    Type II diabetes mellitus ([place])    UTI (urinary tract infection)    being treated with Keflex   Ventricular septal defect    Past Surgical History:  Past Surgical History:  Procedure Laterality Date   ABDOMINAL HYSTERECTOMY     BACK SURGERY     CARDIAC CATHETERIZATION N/A 06/23/2015   Procedure: Left Heart Cath and Coronary Angiography;  Surgeon: Larey Dresser, MD;  Location: [place] [facility];  Service: Cardiovascular;  Laterality: N/A;   CARDIAC CATHETERIZATION  1960   "VSD was so small; didn't need repaired"   EXAM UNDER ANESTHESIA WITH MANIPULATION OF HIP Right 06/02/2014   dr Rhona Raider   FRACTURE SURGERY     HERNIA REPAIR     HIP CLOSED REDUCTION Right 06/02/2014   Procedure: CLOSED MANIPULATION HIP;  Surgeon: Hessie Dibble, MD;  Location: [place];  Service: Orthopedics;  Laterality: Right;   JOINT REPLACEMENT     JOINT REPLACEMENT     POSTERIOR LUMBAR FUSION 4 LEVEL N/A 06/26/2022   Procedure: Lumbar One To Lumbar Five Posterior Instrumented Fusion;  Surgeon: Judith Part, MD;  Location: [place];  Service: Neurosurgery;  Laterality: N/A;   REFRACTIVE SURGERY Bilateral    SHOULDER ARTHROSCOPY Right    SHOULDER OPEN ROTATOR CUFF REPAIR Right    SPINAL FUSION  1996   "t10  down to my coccyx   SPINE HARDWARE REMOVAL     TOTAL ABDOMINAL HYSTERECTOMY     TOTAL HIP ARTHROPLASTY Right 05/10/2014   [place]      by dr Harrell Gave [place]   TOTAL KNEE ARTHROPLASTY Left    TOTAL SHOULDER ARTHROPLASTY Left 08/19/2016   Procedure: TOTAL SHOULDER ARTHROPLASTY;  Surgeon: Tania Ade, MD;  Location: [place];  Service: Orthopedics;  Laterality: Left;  Left total shoulder replacement   Social History:  reports that she has quit smoking. Her smoking use included cigarettes. She has never used smokeless tobacco. She reports that she does not drink alcohol and does not use drugs.  Family / Support Systems Marital Status: Married Patient Roles: Spouse, Parent, Other (Comment) (business owner) Spouse/Significant Other: Cooper-husband [phone] Children: Laine-daughter [phone]  Sarah-daughter [phone] Other Supports: Friends and church members Anticipated Caregiver: Husband and three children along with hired assist Ability/Limitations of Caregiver: Family runs their own business but will try to flex and hire assistance if needed. Caregiver Availability: 24/7 Family Dynamics: Close knit family who will make sure pt's needs are met, they will take her home and have 24/7 care between themselves and hired assist  Social History Preferred language: English Religion: Other Cultural Background: No issues Education: College-retired English Teacher/Artist Health Literacy - How often do you need to have someone help you when you  read instructions, pamphlets, or other written material from your doctor or pharmacy?: Never Writes: Yes Employment Status: Retired Public relations account executive Issues: Feel the surgeon botched her surgery, and this is the reason they are here 24/7 with her Guardian/Conservator: None-according to MD pt is not fully capable of making her own decisions while here, family is here with her if nay need to be made.   Abuse/Neglect Abuse/Neglect Assessment  Can Be Completed: Yes Physical Abuse: Denies Verbal Abuse: Denies Sexual Abuse: Denies Exploitation of patient/patient's resources: Denies Self-Neglect: Denies  Patient response to: Social Isolation - How often do you feel lonely or isolated from those around you?: Never  Emotional Status Pt's affect, behavior and adjustment status: Pt is motivated to do well and recover and get back home. It has been a long journey and she is ready for it to be over. She was mod/i back in12/2023 prior to surgery before she fell and broke her back, to start all of this. Recent Psychosocial Issues: other health issues-prior hip replacements, shoulder, knee and back srugery Psychiatric History: HX-Bipolar take medications for this, with how long her hospitalization and SNF and complications from will benefit from neuro-psych seeing her while here Substance Abuse History: No issues  Patient / Family Perceptions, Expectations & Goals Pt/Family understanding of illness & functional limitations: Pt and son can explain her multiple surgeries and long hospital stay along with SNF stay. They talk with the MD daily and feel they have a good understanding of her plan movng forward and hopeful the trach and NG tube will come out very soon Premorbid pt/family roles/activities: Mom, wife, grandmother, business owner, church member, etc Anticipated changes in roles/activities/participation: resume Pt/family expectations/goals: Pt states: " I hope to be able to get my strength back in my legs before I go."  Son states: " We will do whatever she needs [place] to do."  [employer]: Other (Comment) (Was at [facility]-07/08/2022-08/09/2022) Premorbid Home Care/DME Agencies: Other (Comment) (rw and wc) Transportation available at discharge: Husband and family Is the patient able to respond to transportation needs?: Yes In the past 12 months, has lack of transportation kept you from medical appointments  or from getting medications?: No In the past 12 months, has lack of transportation kept you from meetings, work, or from getting things needed for daily living?: No Resource referrals recommended: Neuropsychology  Discharge Planning Living Arrangements: Spouse/significant other Support Systems: Spouse/significant other, Children, Friends/neighbors, Immunologist, Other relatives Type of Residence: Private residence Insurance Resources: Multimedia programmer (specify) Psychologist, prison and probation services) Museum/gallery curator Resources: Fish farm manager, Employment, Family Support Financial Screen Referred: No Living Expenses: Own Money Management: Patient, Spouse Does the patient have any problems obtaining your medications?: No Home Management: Both Patient/Family Preliminary Plans: Return home with family and hired assist will have 24/7 care if needed. They will flex their schedules at their business. Aware goals of min-mod level and hopeful she will do better than this. Care Coordinator Anticipated Follow Up Needs: HH/OP  Clinical Impression Pleasant female who is motivated to do well and get back home, since it has been such a long journey. Her son is present and very supportive and involved. Will work on discharge needs and ask neuro-psych to see while here for coping. Work on discharge needs.  [name] 09/06/2022, 11:11 AM

## 2022-09-06 NOTE — Progress Notes (Signed)
Physical Therapy Session Note  Patient Details  Name: [name] MRN: [id number] Date of Birth: 11-20-1955  Today's Date: 09/06/2022 PT Individual Time: [id number] PT Individual Time Calculation (min): 70 min   Short Term Goals: Week 1:  PT Short Term Goal 1 (Week 1): Pt will perform bed mobility with MaxA +1. PT Short Term Goal 2 (Week 1): Pt will maintain unsupported seated balance with overall CGA for >5 min. PT Short Term Goal 3 (Week 1): Pt will perform sit<>stand with MaxA +1. PT Short Term Goal 4 (Week 1): Pt will perform seat-to-seat transfers with MaxA +1 PT Short Term Goal 5 (Week 1): Pt will initiate pre-gait training.  Skilled Therapeutic Interventions/Progress Updates:    Chart reviewed and pt agreeable to therapy. Pt received semi-reclined in bd with general c/o diffuse pain not quantified. Session focused on trunk strengthening and lower body strengthening to support return to standing and amb for home access. Pt initiated session with resisted supine hip IE/ER, DF/PF and leg ext with resistance from PT. Pt then sat upright with bed features. Pt walked BLE off bedside with Min + VC. Pt instructed on using bedrail to help side turning. Pt then sat EOB with MaxA +2. At EOB, pt leaned into elevated headrest (max height) and completed 1x10 pushes to upright sitting from R leaning position against bed. Pt then completed 2 x30sec static sit with ModA and totalA during rest. [name] then placed in front of pt. Pt held [name] bar and completed 2x10 scap pulls with forward trunk leaning and return to upright. Pt then practice sit to stand in blocked practice. In total, pt completed 6 partial sit to stands with ModA + 2 and VC for pressing into feet. Pt demonstrated good ability to initiate stand and progressive height for stands 1-4. Pt then began to fatigue for stands 5 and 6. Pt then required maxA +2 + [name] for partial stand to replace bed pad. Pt then required totalA +2 to return to  bed. At end of session, pt was left semi-reclined in bed with alarm engaged, nurse call bell and all needs in reach.     Therapy Documentation Precautions:  Precautions Precautions: Fall, Back Precaution Comments: trach, cortrack, flexiseal, wound vac on back, using PMSV, rectal tube, urinary catheter Required Braces or Orthoses: Other Brace Other Brace: prevlon boots Restrictions Weight Bearing Restrictions: No Other Position/Activity Restrictions: external rotation R hip at baseline at all times, RLE shorter than LLE, pt ambulated household distances PTA, w/c for community   Therapy/Group: Individual Therapy  Marquette Old, PT, DPT 09/06/2022, 3:46 PM

## 2022-09-06 NOTE — Progress Notes (Signed)
Occupational Therapy Session Note  Patient Details  Name: [name] MRN: [id number] Date of Birth: 1956-02-22  Today's Date: 09/06/2022 OT Individual Time: [id number] OT Individual Time Calculation (min): 64 min    Short Term Goals: Week 1:  OT Short Term Goal 1 (Week 1): Pt will tolerate sitting EOB for 10 minutes with CGA to engage in UB bathing. OT Short Term Goal 2 (Week 1): Pt will be able to bridge hips in bed with mod A to enable placement of pad or brief as needed. OT Short Term Goal 3 (Week 1): Pt will be able to use BUE to wash UB with mod A. OT Short Term Goal 4 (Week 1): Pt will be able to push to stand with max A of 2 and push up to full stand for at least 30 seconds. OT Short Term Goal 5 (Week 1): Pt will demonstrate improved attention to focus on 1 ADL task with min cues.  Skilled Therapeutic Interventions/Progress Updates:    Pt received supine with mild back pain reported at rest, agreeable to OT session. Son present and supportive throughout session. Pt on humidified air and PMSV which was removed for duration of session and replaced at end. Pt and son reporting frustration with not getting adequate rest d/t frequent checks at night and not liking hospital food. Explained reasoning with and encouraged outside food as able. Pt verbose and with obvious STM deficits, repeating the same story several times but alert and oriented. Extra time needed during session for line management. She came to EOB with max A, requiring LE management and trunk elevation. Once EOB she demonstrated significant improvement from yesterday's session and sat for 20+ min with only CGA and occasional posterior support. She reported back pain increasing with sitting and with further activity- rest breaks provided as appropriate. She stood from EOB with B HHA, requiring max +2 assist but with improved glute activation and trunk elevation. [name] used for next sit <> stand, requiring x3 attempts and max A  +2. She was able to remain perched for several minutes for improved trunk control and postural stability strengthening. She required +3 assist to stand again d/t using regular stedy d/t difficulty bringing hips forward. She was transferred to the TIS w/c. She was reclined backward for comfort. Pt left sitting up with all needs met, son present supervision and all lines back in order with wound vac plugged in. Informed RN to use bariatric stedy with +2-3 assist.   Therapy Documentation Precautions:  Precautions Precautions: Fall, Back Precaution Comments: trach, cortrack, flexiseal, wound vac on back, using PMSV, rectal tube, urinary catheter Required Braces or Orthoses: Other Brace Other Brace: prevlon boots Restrictions Weight Bearing Restrictions: No Other Position/Activity Restrictions: external rotation R hip at baseline at all times, RLE shorter than LLE, pt ambulated household distances PTA, w/c for community  Therapy/Group: Individual Therapy  Curtis Sites 09/06/2022, 6:37 AM

## 2022-09-06 NOTE — Progress Notes (Addendum)
PROGRESS NOTE   Subjective/Complaints:  Patient reports she has been eating well.  Son at bedside agrees.  [name] is uncomfortable at times.  She does not like the taste of potassium supplement.   ROS: +diarrhea-improved, +back pain, +insomnia/poor sleep, +LE swelling. Denies fevers, chills, CP, SOB, abd pain, N/V/C, new/worsening paresthesias/weakness, or any other complaints at this time.    Objective:   No results found. Recent Labs    09/04/22 0145 09/06/22 0418  WBC 7.3 7.1  HGB 8.6* 9.1*  HCT 25.8* 28.6*  PLT 359 398    Recent Labs    09/04/22 0145 09/06/22 0418  NA 129* 132*  K 4.0 3.1*  CL 95* 96*  CO2 26 24  GLUCOSE 96 109*  BUN 5* 5*  CREATININE <0.30* <0.30*  CALCIUM 8.3* 8.7*     Intake/Output Summary (Last 24 hours) at 09/06/2022 [id number] Last data filed at 09/06/2022 0640 Gross per 24 hour  Intake 1917.56 ml  Output 3900 ml  Net -1982.44 ml         Physical Exam: Vital Signs Blood pressure (!) 154/88, pulse 80, temperature 98.1 F (36.7 C), resp. rate 18, height 5' 1"$  (1.549 m), weight 71.9 kg, SpO2 97 %.  Constitutional:  thin, , NAD, eating breakfast HENT: [place]/AT, Cortrak in place in L nare, MMM.   Eyes: Conjugate gaze, conjunctiva clear, PERRL Neck:     Comments: #6 trach in place. Pt able to speak around trach fairly well. Excellent phonation with PMV Cardiovascular: RRR, soft murmur heard (VSD), 1+ BLE swelling-stable Pulmonary: CTAB no w/r/r, no increased WOB Abdominal: Soft, nontender, nondistended, positive bowel sounds/normal active Genitourinary:    Comments: Rectal tube and foley in place. Rectal tube with scant brown stool in tubing  PRIOR EXAM: Musculoskeletal:        General: Swelling present.     Cervical back: Normal range of motion.     Comments: Right leg discrepancy with weakness and leg rotated outward. Dependent edema Right forearm and hand and left forearm.    Skin:    Comments: Well healed old right thigh incision. Wound VAC in place, low back  Neurological:     Mental Status: She is alert and oriented to person, place, and time.     Comments: Alert and oriented x 3 (needed some help with day) much improved insight and awareness. [place] Memory. Normal language and speech. Cranial nerve exam unremarkable. UE 4/5 prox to distal. RLE 2-/5 prox to 4/5 distally. LLE 2+/5 prox to 4/5 distally. Senses pain in all 4 limbs. No abnl resting tone.   Psychiatric:        Mood and Affect: Mood normal.        Behavior: Behavior normal.   Assessment/Plan: 1. Functional deficits which require 3+ hours per day of interdisciplinary therapy in a comprehensive inpatient rehab setting. Physiatrist is providing close team supervision and 24 hour management of active medical problems listed below. Physiatrist and rehab team continue to assess barriers to discharge/monitor patient progress toward functional and medical goals  Care Tool:  Bathing  Bathing activity did not occur: Refused           Bathing assist  Upper Body Dressing/Undressing Upper body dressing   What is the patient wearing?: Hospital gown only    Upper body assist Assist Level: Total Assistance - Patient < 25%    Lower Body Dressing/Undressing Lower body dressing      What is the patient wearing?: Hospital gown only     Lower body assist Assist for lower body dressing: Total Assistance - Patient < 25%     Toileting Toileting Toileting Activity did not occur Landscape architect and hygiene only): N/A (no void or bm) (using catheter and rectal tube)  Toileting assist       Transfers Chair/bed transfer  Transfers assist  Chair/bed transfer activity did not occur: Safety/medical concerns        Locomotion Ambulation   Ambulation assist   Ambulation activity did not occur: Safety/medical concerns          Walk 10 feet activity   Assist  Walk 10 feet  activity did not occur: Safety/medical concerns        Walk 50 feet activity   Assist Walk 50 feet with 2 turns activity did not occur: Safety/medical concerns         Walk 150 feet activity   Assist Walk 150 feet activity did not occur: Safety/medical concerns         Walk 10 feet on uneven surface  activity   Assist Walk 10 feet on uneven surfaces activity did not occur: Safety/medical concerns         Wheelchair     Assist Is the patient using a wheelchair?: Yes Type of Wheelchair: Manual Wheelchair activity did not occur: Safety/medical concerns         Wheelchair 50 feet with 2 turns activity    Assist    Wheelchair 50 feet with 2 turns activity did not occur: Safety/medical concerns       Wheelchair 150 feet activity     Assist  Wheelchair 150 feet activity did not occur: Safety/medical concerns       Blood pressure (!) 154/88, pulse 80, temperature 98.1 F (36.7 C), resp. rate 18, height 5' 1"$  (1.549 m), weight 71.9 kg, SpO2 97 %.  Medical Problem List and Plan: 1. Functional deficits secondary to osteomyelitis,infection of lumbar surgical site complicated by meningitis/ventriculitis.             -pt also with significant deconditioning             -patient may not yet shower             -ELOS/Goals: 20-27 days, supervision to min assist with PT, OT, SLP  -Continue CIR, PT OT SLP 2.  Antithrombotics: -DVT/anticoagulation:  Pharmaceutical: Lovenox 711m QD             -antiplatelet therapy: N/a 3. Pain Management: Butrans 526m/hr patch for pain control w/ Flexeril 11m51mID, Tylenol PRN, Oxycodone 7.11mg40mh PRN  -09/04/22 increased flexeril to 7.11mg 38m, monitor for effect -09/05/22 still having some pain, will schedule Oxycodone 11mg q56mand leave 2.11mg q423mRN dosing-- she's been getting at least 111mg da10mwith the PRNs, but doesn't feel adequate control because she's having to ask for it and feels overwhelmed by all the  meds/lines/tubes/etc. Hopefully scheduling will help-- monitor for improvement.   4. Mood/Behavior/Sleep: LCSW to follow for evaluation and support.             --Trazodone 100mg at 7mime for  insomnia/mood  -09/04/22 placed sign on door to minimize interruptions 11p-5a;  changed CBG checks as below to minimize interruptions             -antipsychotic agents:  Luvox 169m QHS and Lamictal 2049mQD  -Continue Aricept 1082mD 5. Neuropsych/cognition: This patient is not fully capable of making decisions on her own behalf. 6. Skin/Wound Care: WOC to follow for wound VAC changes --MASD managed by fecal system-- flexiseal clogged 09/04/22 AM but nursing states it's now working; monitor--might be able to d/c soon? 7. Fluids/Electrolytes/Nutrition: Monitor I/O. Continue tube feeds-->likely contributing to diarrhea             -pt had unrecorded intake today             -both she and daughters would like to transition from NGT             -will cut TF in half tonight             -monitor intake closely, continue vitamins             -09/04/22 CMP fairly stable, monitor weekly labs starting 09/06/22 -Cortrak clogged this morning 09/04/22, RD made recs for unclogging, nursing to attempt to continue unclogging overnight-- if unsuccessful, nursing will let me know, and pt will need cortrak team consult on Monday; since it's still not functional, will give gentle IVFs overnight, recheck Cortrak status in AM; push PO intake today -09/05/22 PO intake picking up with outside food, Cortrak still clogged-- will unhook but leave in place for now, weekday team to decide on necessity, pt hopeful for removal. IF Cortrak needed, place consult for Cortrak team to assess unclogging it. Doubt need for IVFs tonight, encourage PO fluids (has multiple beverages at bedside).   2/19 discontinue core track 8.Sepsis w/ osteomyelitis/meningitis/encephalitis: Continue Cefepime 2g q8h, Vanc 1250m52m2h, and Flagyl 500mg30m for at least 6  weeks             --end date per ID. Will follow up on plan during admit  9. H/o PVT:  Monitor BP/heart rate TID--on coreg 12.5mg B67m Labetaolol 10mg q107mRN  -09/04/22 BP/HR fairly well controlled, cont regimen, monitor  -2/19 consider increase BP medications tomorrow if continues to be elevated     09/06/2022    2:54 PM 09/06/2022   12:05 PM 09/06/2022    8:56 AM  Vitals with BMI  Systolic 154    123456stolic 85    Pulse 75 75 82      10. Urinary retention: Has failed 4 voiding trails. Has had foley since 12/23             -maintain foley cath, foley mgt per protocol -- Continue Flomax 0.4mg qPM52mill d/c urecholine.  11. VDRF s/p trach: Tolerating PMSV well -I ordered downsizing to CFS # 4 today--tolerated well -continue ATC w/ pulmonary hygiene -begin day time plugging of trach tomorrow. Decannulate soon.  -Trial of capping with respiratory 12. T2DM: Monitor BS ac/hs and use SSI for elevated BS.              --continue tube feeds with supplements between meals due to poor intake             --continue liberalized diet for now. -09/04/22 CBGs looking great, Changed SSI and CBG checks to TID AC + QHS to minimize interruptions overnight, since tube feeds are decreased/temporarily stopped currently; if tube feeds continued longer term, consider changing back to q4h if desired -2/19 well-controlled, continue current regimen CBG (last 3)  Recent  Labs    09/05/22 1653 09/05/22 2012 09/06/22 0644  GLUCAP 102* 98 113*     13. Hypokalemia: Recurrent likely due to diarrhea/intake.              --will add standing dose BID -->recheck labs on 02/17 and 02/19  -09/04/22 K+ 4.0, continue Klor 35mq BID, monitor on weekly labs -09/05/22 doesn't like Klor packet but no great alternative (Phos-Nak would have to be heavily dosed to get equivalent K+, so only other alternative is IV-- will hold off for now) -2/19 Increase K+ supplement to 432m BID, could consider change to tablet however this is  a large tablet and she had trouble swallowing it previously so continue packet for now 14. Anemia of critical illness: Hgb 7.5-8.9 range -09/04/22 Hgb stable at 8.6, continue to monitor on weekly labs starting 09/06/22 15. Hyponatremia/hypochloremia: Improving 125/87-->130/94.  -09/04/22 Na 129, Cl 95, both stable, continue salt tabs 1g BID -Cortisol testing done during hospitalization (13.8 on 08/30/22), was normal, but daughter and pt want to make sure this is followed up--defer to weekday team regarding ongoing ?Addison's testing  2/19 improved to 132 16. Encephalopathy/Delirium: Continues to have intermittent confusion--but much improved compared to 3 days ago per family.  --Continue delirium precautions. 17. Loose stools likely d/t TF and abx.             -reducing TF as above.  -add probiotics and fiber-- fiber changed to Nutrisource per RD on 09/04/22 since this should cause less clogging of Cortrak -Lomotil 1-2 tabs QID PRN -09/05/22 Cortrak still clogged, taking PO better, and diarrhea seems to be slowing-- reassess needs depending on Cortrak removal timing 18. Hypothyroidism: continue synthroid 8856mQD 19. Chronic combined CHF, BLE Swelling: recent 2D echo done 08/20/2022 showed apical hypokinesis, LVEF 40-45%, RV function was reduced. Intermittently diuresed during hospitalization, most recently given 66m36m lasix on 09/03/22 -09/04/22 noted to still have BLE swelling, start 20mg30mlasix and monitor lytes/BP/swelling -09/05/22 stable swelling, no worse today; cont lasix; daily weights ordered but not done (also, weight will be skewed because of all her equipment)  Filed Weights   09/03/22 1941  Weight: 71.9 kg      LOS: 3 days A FACE TO FACE EVALUATION WAS PERFORMED  Latyra Jaye Jennye Boroughs/2024, 8:22 AM

## 2022-09-06 NOTE — Progress Notes (Signed)
Inpatient [facility] Individual Statement of Services  Patient Name:  [name]  Date:  09/06/2022  Welcome to the [facility].  Our goal is to provide you with an individualized program based on your diagnosis and situation, designed to meet your specific needs.  With this comprehensive rehabilitation program, you will be expected to participate in at least 3 hours of rehabilitation therapies Monday-Friday, with modified therapy programming on the weekends.  Your rehabilitation program will include the following services:  Physical Therapy (PT), Occupational Therapy (OT), Speech Therapy (ST), 24 hour per day rehabilitation nursing, Therapeutic Recreaction (TR), Neuropsychology, Care Coordinator, Rehabilitation Medicine, Nutrition Services, and Pharmacy Services  Weekly team conferences will be held on Wednesday to discuss your progress.  Your Inpatient Rehabilitation Care Coordinator will talk with you frequently to get your input and to update you on team discussions.  Team conferences with you and your family in attendance may also be held.  Expected length of stay: 27-30 days  Overall anticipated outcome: min-mod level  Depending on your progress and recovery, your program may change. Your Inpatient Rehabilitation Care Coordinator will coordinate services and will keep you informed of any changes. Your Inpatient Rehabilitation Care Coordinator's name and contact numbers are listed  below.  The following services may also be recommended but are not provided by the [facility]:   [place] will be made to provide these services after discharge if needed.  Arrangements include referral to agencies that provide these services.  Your insurance has been verified to be:  [place] primary doctor is:  Personnel officer  Pertinent information will be  shared with your doctor and your insurance company.  Inpatient Rehabilitation Care Coordinator:  Ovidio Kin, [place] or [name]  Information discussed with and copy given to patient by: [name], 09/06/2022, 9:20 AM

## 2022-09-06 NOTE — Progress Notes (Signed)
Speech Language Pathology Daily Session Note  Patient Details  Name: [name] MRN: [id number] Date of Birth: 07/23/1955  Today's Date: 09/06/2022 SLP Individual Time: 0930-1030 SLP Individual Time Calculation (min): 60 min  Short Term Goals: Week 1: SLP Short Term Goal 1 (Week 1): Pt will tolerate current diet with no overt s/sx of aspiration. SLP Short Term Goal 2 (Week 1): Pt will tolerate speaking valve and recall how to remove speaking valve in case of an emergency independently. SLP Short Term Goal 3 (Week 1): Pt will use external memory aids to recall important information with minA verbal cues. SLP Short Term Goal 4 (Week 1): Pt will recall 2 areas of cognitive impairment with minA verbal cues. SLP Short Term Goal 5 (Week 1): Pt will participate in therapeutic task for 20 minutes with minA verbal cues.  Skilled Therapeutic Interventions: Skilled ST treatment focused on cognitive goals. Pt was greeted upright in wheelchair on arrival, PMSV in place, and accompanied by her son. Pt was in good spirits and very cooperative. Pt recalled some details of her interaction with SLP during initial eval and was apologetic for having a dismissive demeanor toward therapist. Pt tolerated PMSV for duration of session with vitals remaining stable. SpO2 100%, HR 78. Per RT, there may be plans for capping trials today. Pt/family also hopeful for removal of cortrak soon. Pt's vocal quality was clear and strong with intelligible speech. Pt was oriented to all concepts with sup A verbal cues. Pt exhibited average recall/awareness of current situation and reasoning for hospitalization. Pt requiring education and insight regarding cognitive changes. Pt did endorse "I've been off in [place]" but feels to be improving each day. Son supports improvements. Pt was known to ask the same questions and repeat similar comments throughout session without awareness. Pt benefited from min-to-mod A verbal redirection  cues throughout session for sustained attention for ~6-7 minute intervals. SLP facilitated "solving daily math problems" via the ALFA with 5/10 accurate at independent level, progressing to 8/10 with min-to-mod A verbal/visual cues and use of calculator. Pt c/o sleepiness which mildly impacted participation. Patient was left in wheelchair with alarm activated and immediate needs within reach at end of session. SW at bedside. Continue per current plan of care.      Pain  None/denied  Therapy/Group: Individual Therapy  [name] 09/06/2022, 12:01 PM

## 2022-09-06 NOTE — Progress Notes (Signed)
Patient ID: [name], female   DOB: [birth year], 67 y.o.   MRN: [id number] Met with the patient and spouse to review current situation, rehab process, team conference and plan of care. Discussed goal for removal of tubes, skin care, and trach before discharge. Spouse expressed concern of timing for discharge and clarified team conference weekly to identify needs, coordinate activities so that care can be managed and patient and spouse feel ready when discharge is set. Continue to follow along to address educational needs to facilitate preparation for discharge. Margarito Liner

## 2022-09-06 NOTE — Progress Notes (Signed)
Patient alert and oriented, son at bedside. Son anxious about removal of cortrak and having trach removed. Respiratory came in to see patient and states she looks well enough to be capped. Patient eating well, mostly food her family brings. Patient tolerating regular diet. Informed Dr. Marciano Sequin of family wanting removal of cortrak and having trach capped as per recommendation per respiratory as well.

## 2022-09-06 NOTE — Progress Notes (Signed)
Cotrak removed per MD order. Patient tolerated well and expressed thanks.

## 2022-09-06 NOTE — Plan of Care (Signed)
  Problem: RH Balance Goal: LTG Patient will maintain dynamic sitting balance (PT) Description: LTG:  Patient will maintain dynamic sitting balance with assistance during mobility activities (PT) Flowsheets (Taken 09/04/2022 1749) LTG: Pt will maintain dynamic sitting balance during mobility activities with:: Supervision/Verbal cueing Goal: LTG Patient will maintain dynamic standing balance (PT) Description: LTG:  Patient will maintain dynamic standing balance with assistance during mobility activities (PT) Flowsheets (Taken 09/04/2022 1749) LTG: Pt will maintain dynamic standing balance during mobility activities with:: Minimal Assistance - Patient > 75%   Problem: Sit to Stand Goal: LTG:  Patient will perform sit to stand with assistance level (PT) Description: LTG:  Patient will perform sit to stand with assistance level (PT) Flowsheets (Taken 09/04/2022 1749) LTG: PT will perform sit to stand in preparation for functional mobility with assistance level: Minimal Assistance - Patient > 75%   Problem: RH Bed Mobility Goal: LTG Patient will perform bed mobility with assist (PT) Description: LTG: Patient will perform bed mobility with assistance, with/without cues (PT). Flowsheets (Taken 09/04/2022 1749) LTG: Pt will perform bed mobility with assistance level of: Minimal Assistance - Patient > 75%   Problem: RH Bed to Chair Transfers Goal: LTG Patient will perform bed/chair transfers w/assist (PT) Description: LTG: Patient will perform bed to chair transfers with assistance (PT). Flowsheets (Taken 09/04/2022 1749) LTG: Pt will perform Bed to Chair Transfers with assistance level: Moderate Assistance - Patient 50 - 74%   Problem: RH Car Transfers Goal: LTG Patient will perform car transfers with assist (PT) Description: LTG: Patient will perform car transfers with assistance (PT). Flowsheets (Taken 09/04/2022 1749) LTG: Pt will perform car transfers with assist:: Moderate Assistance - Patient  50 - 74%   Problem: RH Furniture Transfers Goal: LTG Patient will perform furniture transfers w/assist (OT/PT) Description: LTG: Patient will perform furniture transfers  with assistance (OT/PT). Flowsheets (Taken 09/04/2022 1749) LTG: Pt will perform furniture transfers with assist:: Moderate Assistance - Patient 50 - 74%   Problem: RH Ambulation Goal: LTG Patient will ambulate in controlled environment (PT) Description: LTG: Patient will ambulate in a controlled environment, # of feet with assistance (PT). Flowsheets (Taken 09/04/2022 1749) LTG: Pt will ambulate in controlled environ  assist needed:: Moderate Assistance - Patient 50 - 74% LTG: Ambulation distance in controlled environment: 60 ft using LRAD   Problem: RH Wheelchair Mobility Goal: LTG Patient will propel w/c in controlled environment (PT) Description: LTG: Patient will propel wheelchair in controlled environment, # of feet with assist (PT) Flowsheets (Taken 09/04/2022 1749) LTG: Pt will propel w/c in controlled environ  assist needed:: Minimal Assistance - Patient > 75% LTG: Propel w/c distance in controlled environment: more than 50 ft in straight path Goal: LTG Patient will propel w/c in home environment (PT) Description: LTG: Patient will propel wheelchair in home environment, # of feet with assistance (PT). Flowsheets (Taken 09/04/2022 1749) LTG: Pt will propel w/c in home environ  assist needed:: Minimal Assistance - Patient > 75% Distance: wheelchair distance in controlled environment: 50 LTG: Propel w/c distance in home environment: at least 40 ft with all turns and avoiding obstacles

## 2022-09-06 NOTE — Progress Notes (Signed)
Inpatient Rehabilitation  Patient information reviewed and entered into eRehab system by Lilyona Richner [facility], OTR/L, Rehab Quality Coordinator.   Information including medical coding, functional ability and quality indicators will be reviewed and updated through discharge.   

## 2022-09-06 NOTE — Progress Notes (Signed)
Pt's trach capped per MD! Tolerating well.

## 2022-09-07 DIAGNOSIS — E871 Hypo-osmolality and hyponatremia: Secondary | ICD-10-CM | POA: Diagnosis not present

## 2022-09-07 DIAGNOSIS — E119 Type 2 diabetes mellitus without complications: Secondary | ICD-10-CM | POA: Diagnosis not present

## 2022-09-07 DIAGNOSIS — Z930 Tracheostomy status: Secondary | ICD-10-CM | POA: Diagnosis not present

## 2022-09-07 DIAGNOSIS — M4626 Osteomyelitis of vertebra, lumbar region: Secondary | ICD-10-CM | POA: Diagnosis not present

## 2022-09-07 LAB — GLUCOSE, CAPILLARY
Glucose-Capillary: 104 mg/dL — ABNORMAL HIGH (ref 70–99)
Glucose-Capillary: 110 mg/dL — ABNORMAL HIGH (ref 70–99)
Glucose-Capillary: 111 mg/dL — ABNORMAL HIGH (ref 70–99)
Glucose-Capillary: 116 mg/dL — ABNORMAL HIGH (ref 70–99)
Glucose-Capillary: 118 mg/dL — ABNORMAL HIGH (ref 70–99)
Glucose-Capillary: 121 mg/dL — ABNORMAL HIGH (ref 70–99)
Glucose-Capillary: 122 mg/dL — ABNORMAL HIGH (ref 70–99)
Glucose-Capillary: 140 mg/dL — ABNORMAL HIGH (ref 70–99)
Glucose-Capillary: 89 mg/dL (ref 70–99)
Glucose-Capillary: 96 mg/dL (ref 70–99)
Glucose-Capillary: 99 mg/dL (ref 70–99)
Glucose-Capillary: 119 mg/dL — ABNORMAL HIGH (ref 70–99)
Glucose-Capillary: 102 mg/dL — ABNORMAL HIGH (ref 70–99)

## 2022-09-07 LAB — BASIC METABOLIC PANEL
Anion gap: 10 (ref 5–15)
BUN: 5 mg/dL — ABNORMAL LOW (ref 8–23)
CO2: 22 mmol/L (ref 22–32)
Calcium: 8.4 mg/dL — ABNORMAL LOW (ref 8.9–10.3)
Chloride: 99 mmol/L (ref 98–111)
Creatinine, Ser: 0.33 mg/dL — ABNORMAL LOW (ref 0.44–1.00)
GFR, Estimated: >60 mL/min (ref >60)
Glucose, Bld: 94 mg/dL (ref 70–99)
Potassium: 2.9 mmol/L — ABNORMAL LOW (ref 3.5–5.1)
Sodium: 131 mmol/L — ABNORMAL LOW (ref 135–145)

## 2022-09-07 MED ORDER — POTASSIUM CHLORIDE CRYS ER 20 MEQ PO TBCR
20.0000 meq | EXTENDED_RELEASE_TABLET | Freq: Two times a day (BID) | ORAL | Status: AC
  Administered 2022-09-07 (×2): 20 meq via ORAL
  Filled 2022-09-07 (×2): qty 1

## 2022-09-07 MED ORDER — POTASSIUM CHLORIDE CRYS ER 10 MEQ PO TBCR: 20.0000 meq | EXTENDED_RELEASE_TABLET | ORAL | Status: DC

## 2022-09-07 MED ORDER — POTASSIUM CHLORIDE 10 MEQ/100ML IV SOLN
10.0000 meq | INTRAVENOUS | Status: AC
  Administered 2022-09-07 (×5): 10 meq via INTRAVENOUS
  Filled 2022-09-07 (×5): qty 100

## 2022-09-07 MED ORDER — CHLORHEXIDINE GLUCONATE CLOTH 2 % EX PADS
6.0000 | MEDICATED_PAD | Freq: Every day | CUTANEOUS | Status: DC
  Administered 2022-09-07 – 2022-09-08 (×2): 6 via TOPICAL

## 2022-09-07 MED ORDER — ENSURE ENLIVE PO LIQD
237.0000 mL | Freq: Three times a day (TID) | ORAL | Status: DC
  Administered 2022-09-07 – 2022-10-09 (×48): 237 mL via ORAL

## 2022-09-07 NOTE — Progress Notes (Signed)
Occupational Therapy Session Note  Patient Details  Name: [name] MRN: [id number] Date of Birth: 1955-11-23  Today's Date: 09/07/2022 OT Individual Time: 1420-1508 OT Individual Time Calculation (min): 48 min    Short Term Goals: Week 1:  OT Short Term Goal 1 (Week 1): Pt will tolerate sitting EOB for 10 minutes with CGA to engage in UB bathing. OT Short Term Goal 2 (Week 1): Pt will be able to bridge hips in bed with mod A to enable placement of pad or brief as needed. OT Short Term Goal 3 (Week 1): Pt will be able to use BUE to wash UB with mod A. OT Short Term Goal 4 (Week 1): Pt will be able to push to stand with max A of 2 and push up to full stand for at least 30 seconds. OT Short Term Goal 5 (Week 1): Pt will demonstrate improved attention to focus on 1 ADL task with min cues.  Skilled Therapeutic Interventions/Progress Updates:    Pt greeted semi-reclined in bed with daughter present and agreeable to OT treatment session. Pt initially did not want to get OOB, but agreeable with encouragement from daughter and OT. Max A for log roll, then sidelying to si. Pt with good sitting balance at EOB with mostly supervision. Pt tolerated sitting for ~25 mins. OT issued yellow theraband. Pt completed bicep curl, seated row, chest pull, and triceps press seated EOB. Worked on lateral scooting along EOB with cues for anterior weight shift and head/hips relationship. Max A for small lateral scoots. Pt needed max A to return to bed and +2 to scoot up in bed. Pt left semi-reclined in bed with nursing present and needs met.   Therapy Documentation Precautions:  Precautions Precautions: Fall, Back Precaution Comments: trach, cortrack, flexiseal, wound vac on back, using PMSV, rectal tube, urinary catheter Required Braces or Orthoses: Other Brace Other Brace: prevlon boots Restrictions Weight Bearing Restrictions: No Other Position/Activity Restrictions: external rotation R hip at  baseline at all times, RLE shorter than LLE, pt ambulated household distances PTA, w/c for community Pain:  No number given, pt reported back pain.   Therapy/Group: Individual Therapy  Valma Cava 09/07/2022, 3:26 PM

## 2022-09-07 NOTE — Progress Notes (Signed)
Nutrition Follow-up  DOCUMENTATION CODES:   Not applicable  INTERVENTION:  - Continue Reg diet, Thin liquids.   - Add Ensure Enlive po TID, each supplement provides 350 kcal and 20 grams of protein.  - Continue Nutrisource Fiber BID.   NUTRITION DIAGNOSIS:   Increased nutrient needs related to wound healing as evidenced by estimated needs. - Ongoing  GOAL:   Patient will meet greater than or equal to 90% of their needs - Improving.   MONITOR:   PO intake, Supplement acceptance, Skin, Labs  REASON FOR ASSESSMENT:   Consult Assessment of nutrition requirement/status  ASSESSMENT:   67 y.o. female admitted to CIR after revision of lumbar surgery after non healing and exposed hardware. Required trach and Cortrak placement. PMH includes HTN, GERD, HLD and T2DM.  Meds reviewed: pepcid, Nutrisource fiber BID, Lasix, sliding scale insulin, MVI, Klor-con. Labs reviewed.   Pt with trach collar. Cortrak has been removed. Pt on Regular diet with thin liquids. Pt reports that she has mostly been eating food that family brings in from outside of the hospital. She states that she is tolerating well with no issues. Pt continues with fecal management system in place. Pt has Nutrisource Fiber ordered. RD discussed adding Banatrol for the pt but she already has fiber supplementation ordered and is having formed bowel movements. RD will hold off on adding. Pt also agreed to try Ensure TID. Will add supplements and continue to monitor PO intakes.   Diet Order:   Diet Order             Diet regular Fluid consistency: Thin  Diet effective now                   EDUCATION NEEDS:   No education needs have been identified at this time  Skin:  Skin Assessment: Skin Integrity Issues: Skin Integrity Issues:: Wound VAC, Incisions Wound Vac: Back Incisions: Back  Last BM:  09/06/22  Height:   Ht Readings from Last 1 Encounters:  09/03/22 5' 1"$  (1.549 m)    Weight:   Wt Readings  from Last 1 Encounters:  09/03/22 71.9 kg    Ideal Body Weight:  45.5 kg  BMI:  Body mass index is 29.95 kg/m.  Estimated Nutritional Needs:   Kcal:  1800-2000  Protein:  90-110 grams  Fluid:  >/= 1.8 L  Thalia Bloodgood, RD, LDN, CNSC.

## 2022-09-07 NOTE — Progress Notes (Signed)
Speech Language Pathology Daily Session Note  Patient Details  Name: [name] MRN: [id number] Date of Birth: December 19, 1955  Today's Date: 09/07/2022 SLP Individual Time: 1100-1200 SLP Individual Time Calculation (min): 60 min  Short Term Goals: Week 1: SLP Short Term Goal 1 (Week 1): Pt will tolerate current diet with no overt s/sx of aspiration. SLP Short Term Goal 2 (Week 1): Pt will tolerate speaking valve and recall how to remove speaking valve in case of an emergency independently. SLP Short Term Goal 3 (Week 1): Pt will use external memory aids to recall important information with minA verbal cues. SLP Short Term Goal 4 (Week 1): Pt will recall 2 areas of cognitive impairment with minA verbal cues. SLP Short Term Goal 5 (Week 1): Pt will participate in therapeutic task for 20 minutes with minA verbal cues.  Skilled Therapeutic Interventions: Skilled ST treatment focused on cognitive goals. Pt was greeted semi-reclined in bed on arrival and accompanied by her daughter and nurse for nsg needs. During this time, SLP provided education to dtr regarding SLP POC. Pt/dtr reported no recent swallowing difficulty with consumption of regular textures and thin liquids. Pt was observed consuming whole pill with thin liquid without overt s/sx of aspiration. Recommend f/u on diet tolerance x1 before signing off on swallowing. Pt's trach was capped and cortrak removed. Pt tolerated room air for duration of session with SpO2 96-98%, HR 84. Pt continues to repeat questions throughout session with faint awareness that these topics have already been discussed. Dtr reported ongoing improvement with this however and pleased with pt's progress from a cognitive standpoint. Pt seemed accepting that she is currently dealing with cognitive deficits, however suspect decreased insight into significance of deficits. Pt required mod fading to min A verbal cues for use of external orientation aids to orient to time.  SLP facilitated the session with a novel card game targeting memory, attention, and problem solving skills. Pt completed task with min A verbal cues to monitor and correct errors, and for utilization of external memory aid for recall of task rules and procedures. Pt sustained attention for this task for 25 minute duration with min A verbal redirection cues.  Patient was left in bed with alarm activated and immediate needs within reach at end of session. Continue per current plan of care.      Pain Pain Assessment Pain Scale: 0-10 Pain Score: 0-No pain  Therapy/Group: Individual Therapy  [name] 09/07/2022, 11:14 AM

## 2022-09-07 NOTE — Consult Note (Signed)
New consult for trouble shooting VAC dressing. Provided 59M contacts for onsite support and troubleshooting.  [place], [place], [place]

## 2022-09-07 NOTE — Progress Notes (Addendum)
Pharmacy Antibiotic Note   [name] is a 67 y.o. 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 Pharmacy has been consulted for Vancomycin + Cefepime dosing. She is transferred to CIR. Below is the plan from inpatient admission.    Last vancomycin trough therapeutic at 16 mcg/mL.  Renal function remains stable.  Afebrile, WBC WNL.  Planning 6 wks (EOT 09/30/22), w/ possible need to switch to PO abx thereafter for hardware associated lumbar vertebral osteo   Plan: Continue vanc 1233m IV Q12H Continue Cefepime 2g IV Q8H and Flagyl 5049mIV Q8H Monitor renal function, culture results, weekly vanc trough   Antibiotics this admission: Cipro 1/25 >>1/26  Doxy 1/23 >>1/31 Cefadroxil 1/26 >>1/31 Vanc 1/31>> Zosyn 1/31>>2/1 Cefepime 2/1 >> Flagyl 2/1 >>   Levels: 2/5 VT = 7 on 75041m12 >> 1000m87m 2/6 VT = 15 on 1g q8 2/9 VT = 18 continue current regimen 2/12 VT = 20 >> change to 1250mg39m 2/14 VT = 16 mcg/mL >> no change  Microbiology:  1/22 MRSA PCR: neg;  SA: positive 1/28 C.diff - negative 1/30 BCx x2: neg 1/30 BCx x2: neg 2/2 Resp Cx: rare yeast w/ pseudohyphae (candida alb) 2/6 TA - C.albicans and mold (CCM not concerned with mold)     Thank you for allowing us to[place] in this patients care. TonyaJens SomrmD 09/07/2022 12:19 PM  **Pharmacist phone directory can be found on amionBillingslisted under [facility]

## 2022-09-07 NOTE — Progress Notes (Signed)
Patients trach capped at this time. Vitals stable.

## 2022-09-07 NOTE — Progress Notes (Signed)
Physical Therapy Session Note  Patient Details  Name: [name] MRN: [id number] Date of Birth: 1955-10-22  Today's Date: 09/07/2022 PT Individual Time: [id number] + [id number] PT Individual Time Calculation (min): 40 min + 40 min  Short Term Goals: Week 1:  PT Short Term Goal 1 (Week 1): Pt will perform bed mobility with MaxA +1. PT Short Term Goal 2 (Week 1): Pt will maintain unsupported seated balance with overall CGA for >5 min. PT Short Term Goal 3 (Week 1): Pt will perform sit<>stand with MaxA +1. PT Short Term Goal 4 (Week 1): Pt will perform seat-to-seat transfers with MaxA +1 PT Short Term Goal 5 (Week 1): Pt will initiate pre-gait training.  Skilled Therapeutic Interventions/Progress Updates:     Session 1:  Chart reviewed and pt agreeable to therapy. Pt received semi-reclined in bd with general c/o diffuse pain not quantified. Session focused on trunk strengthening and lower body strengthening to support return to standing and amb for home access. Pt initiated session with resisted supine hip IE/ER and leg ext with resistance from PT. Pt then sat upright with bed features. Pt walked BLE off bedside with Mod + VC. Pt instructed on using bedrail to help side turning. On side, pt completed 10 pulls to side rails with BUE. Pt then sat EOB with MinA +2. At EOB, pt leaned into elevated headrest (max height) and completed 1x6 pushes to upright sitting from R leaning position against bed with MinA. Pt then completed 3 mins static sit with supervision. Pt completed 1x10 alt reaches inside BOS and LAQ. Pt also completed trunk ext and return to center for 10 reps.  Pt then required totalA +2 to return to bed. At end of session, pt was left semi-reclined in bed with alarm engaged, nurse call bell and all needs in reach.   Session 2: Chart reviewed and pt agreeable to therapy. Pt received semi-reclined in bed with c/o pain in back that was no quantified. Session focused on functional  transfers to promote independent mobility. Pt initiated session with transfer to EOB using bed features and AAROM of BLE to walk legs to EOB. Pt required ModA to EOB for all activities. At EOB pt completed 3 mins sitting with no assist and feet supported. Of note, pt stated she felt an urge to pee. The bed was then found to be soaked. RN entered room to confirm issue with foley rather than wound vac. RN then prepared to change pt foley. While preparing, pt completed 1 partial sit to stand with [name] + MaxA. Pt then returned to bed with MaxA + 2. In bed, pt completed L/R rolling with Max A. Pt then left in care of RNs to manage foley.     Therapy Documentation Precautions:  Precautions Precautions: Fall, Back Precaution Comments: trach, cortrack, flexiseal, wound vac on back, using PMSV, rectal tube, urinary catheter Required Braces or Orthoses: Other Brace Other Brace: prevlon boots Restrictions Weight Bearing Restrictions: No Other Position/Activity Restrictions: external rotation R hip at baseline at all times, RLE shorter than LLE, pt ambulated household distances PTA, w/c for community General:     Therapy/Group: Individual Therapy  Marquette Old, PT, DPT 09/07/2022, 12:36 PM

## 2022-09-07 NOTE — Progress Notes (Addendum)
Was informed by assigned NT of patient refusal for CHG bath for PICC/Foley, writer follow-up shortly afterward to explain the purpose and importance of CHG bath, was informed by this patient that she would do the CHG bath but not at this time. Informed CN Iona Beard, RN)and will inform on-coming nurse  0745 CHG BATH and Foley Per care completed.

## 2022-09-07 NOTE — Progress Notes (Signed)
PROGRESS NOTE   Subjective/Complaints:  Reports poor sleep for a few days. Has been better last night with melatonin. K+ has been low.  WOC following.    ROS: +diarrhea-improved, +back pain, +insomnia/poor sleep- a little better, +LE swelling. Denies fevers, chills, cough, CP, SOB, abd pain, N/V/C, new/worsening paresthesias/weakness, or any other complaints at this time.    Objective:   No results found. Recent Labs    09/06/22 0418  WBC 7.1  HGB 9.1*  HCT 28.6*  PLT 398    Recent Labs    09/06/22 0418 09/07/22 0208  NA 132* 131*  K 3.1* 2.9*  CL 96* 99  CO2 24 22  GLUCOSE 109* 94  BUN 5* 5*  CREATININE <0.30* 0.33*  CALCIUM 8.7* 8.4*    Intake/Output Summary (Last 24 hours) at 09/07/2022 0820 Last data filed at 09/07/2022 0650 Gross per 24 hour  Intake 558 ml  Output 1750 ml  Net -1192 ml         Physical Exam: Vital Signs Blood pressure (!) 152/84, pulse 84, temperature 98.2 F (36.8 C), resp. rate 16, height 5' 1"$  (1.549 m), weight 71.9 kg, SpO2 97 %.  Constitutional:  thin, , NAD, eating breakfast HENT: [place]/AT, Cortrak in place in L nare, MMM.   Eyes: Conjugate gaze, conjunctiva clear, PERRL Neck:     Comments: #6 trach in place. Pt able to speak around trach fairly well. Excellent phonation with trach capped Cardiovascular: RRR, soft murmur heard (VSD), 1+ BLE swelling-stable Pulmonary: CTAB no w/r/r, no increased WOB Abdominal: Soft, nontender, nondistended, positive bowel sounds/normal active Genitourinary:    Comments: Rectal tube and foley in place. Rectal tube with scant brown stool in tubing  PRIOR EXAM: Musculoskeletal:        General: Swelling present.     Cervical back: Normal range of motion.     Comments: Right leg discrepancy with weakness and leg rotated outward. Dependent edema Right forearm and hand and left forearm.   Skin:    Comments: Well healed old right thigh incision.  Wound VAC in place, low back  Neurological:     Mental Status: She is alert and oriented to person, place, and time.     Comments: Alert and oriented x 3 (needed some help with day) much improved insight and awareness. [place] Memory. Normal language and speech. Cranial nerve exam unremarkable. UE 4/5 prox to distal. RLE 2-/5 prox to 4/5 distally. LLE 2+/5 prox to 4/5 distally. Senses pain in all 4 limbs. No abnl resting tone.   Psychiatric:        Mood and Affect: Mood normal.        Behavior: Behavior normal.   Assessment/Plan: 1. Functional deficits which require 3+ hours per day of interdisciplinary therapy in a comprehensive inpatient rehab setting. Physiatrist is providing close team supervision and 24 hour management of active medical problems listed below. Physiatrist and rehab team continue to assess barriers to discharge/monitor patient progress toward functional and medical goals  Care Tool:  Bathing  Bathing activity did not occur: Refused           Bathing assist       Upper Body  Dressing/Undressing Upper body dressing   What is the patient wearing?: Hospital gown only    Upper body assist Assist Level: Total Assistance - Patient < 25%    Lower Body Dressing/Undressing Lower body dressing      What is the patient wearing?: Hospital gown only     Lower body assist Assist for lower body dressing: Total Assistance - Patient < 25%     Toileting Toileting Toileting Activity did not occur Landscape architect and hygiene only): N/A (no void or bm) (using catheter and rectal tube)  Toileting assist Assist for toileting: Dependent - Patient 0%     Transfers Chair/bed transfer  Transfers assist  Chair/bed transfer activity did not occur: Safety/medical concerns        Locomotion Ambulation   Ambulation assist   Ambulation activity did not occur: Safety/medical concerns          Walk 10 feet activity   Assist  Walk 10 feet activity did not  occur: Safety/medical concerns        Walk 50 feet activity   Assist Walk 50 feet with 2 turns activity did not occur: Safety/medical concerns         Walk 150 feet activity   Assist Walk 150 feet activity did not occur: Safety/medical concerns         Walk 10 feet on uneven surface  activity   Assist Walk 10 feet on uneven surfaces activity did not occur: Safety/medical concerns         Wheelchair     Assist Is the patient using a wheelchair?: Yes Type of Wheelchair: Manual Wheelchair activity did not occur: Safety/medical concerns         Wheelchair 50 feet with 2 turns activity    Assist    Wheelchair 50 feet with 2 turns activity did not occur: Safety/medical concerns       Wheelchair 150 feet activity     Assist  Wheelchair 150 feet activity did not occur: Safety/medical concerns       Blood pressure (!) 152/84, pulse 84, temperature 98.2 F (36.8 C), resp. rate 16, height 5' 1"$  (1.549 m), weight 71.9 kg, SpO2 97 %.  Medical Problem List and Plan: 1. Functional deficits secondary to osteomyelitis,infection of lumbar surgical site complicated by meningitis/ventriculitis.             -pt also with significant deconditioning             -patient may not yet shower             -ELOS/Goals: 20-27 days, supervision to min assist with PT, OT, SLP  -Continue CIR, PT OT SLP  -Team conference tomorrow  2.  Antithrombotics: -DVT/anticoagulation:  Pharmaceutical: Lovenox 49m QD             -antiplatelet therapy: N/a 3. Pain Management: Butrans 560m/hr patch for pain control w/ Flexeril 26m58mID, Tylenol PRN, Oxycodone 7.26mg60mh PRN  -09/04/22 increased flexeril to 7.26mg 71m, monitor for effect -09/05/22 still having some pain, will schedule Oxycodone 26mg q46mand leave 2.26mg q439mRN dosing-- she's been getting at least 126mg da96mwith the PRNs, but doesn't feel adequate control because she's having to ask for it and feels overwhelmed by all  the meds/lines/tubes/etc. Hopefully scheduling will help-- monitor for improvement.   4. Mood/Behavior/Sleep: LCSW to follow for evaluation and support.             --Trazodone 100mg at 23mime for  insomnia/mood  -09/04/22  placed sign on door to minimize interruptions 11p-5a; changed CBG checks as below to minimize interruptions             -antipsychotic agents:  Luvox 134m QHS and Lamictal 2023mQD  -Continue Aricept 1026mD 5. Neuropsych/cognition: This patient is not fully capable of making decisions on her own behalf. 6. Skin/Wound Care: WOC to follow for wound VAC changes --MASD managed by fecal system-- flexiseal clogged 09/04/22 AM but nursing states it's now working; monitor--might be able to d/c soon? 7. Fluids/Electrolytes/Nutrition: Monitor I/O. Continue tube feeds-->likely contributing to diarrhea             -pt had unrecorded intake today             -both she and daughters would like to transition from NGT             -will cut TF in half tonight             -monitor intake closely, continue vitamins             -09/04/22 CMP fairly stable, monitor weekly labs starting 09/06/22 -Cortrak clogged this morning 09/04/22, RD made recs for unclogging, nursing to attempt to continue unclogging overnight-- if unsuccessful, nursing will let me know, and pt will need cortrak team consult on Monday; since it's still not functional, will give gentle IVFs overnight, recheck Cortrak status in AM; push PO intake today -09/05/22 PO intake picking up with outside food, Cortrak still clogged-- will unhook but leave in place for now, weekday team to decide on necessity, pt hopeful for removal. IF Cortrak needed, place consult for Cortrak team to assess unclogging it. Doubt need for IVFs tonight, encourage PO fluids (has multiple beverages at bedside).   2/19 discontinue core track 8.Sepsis w/ osteomyelitis/meningitis/encephalitis: Continue Cefepime 2g q8h, Vanc 1250m33m2h, and Flagyl 500mg83m for at  least 6 weeks             --end date per ID. Will follow up on plan during admit  9. H/o PVT:  Monitor BP/heart rate TID--on coreg 12.5mg B29m Labetaolol 10mg q10mRN  -09/04/22 BP/HR fairly well controlled, cont regimen, monitor  -2/19 consider increase BP medications tomorrow if continues to be elevated     09/07/2022    1:17 PM 09/07/2022   11:15 AM 09/07/2022    7:55 AM  Vitals with BMI  Systolic 155    99991111stolic 80    Pulse 84 78 84      10. Urinary retention: Has failed 4 voiding trails. Has had foley since 12/23             -maintain foley cath, foley mgt per protocol -- Continue Flomax 0.4mg qPM72mill d/c urecholine.  11. VDRF s/p trach: Tolerating PMSV well -I ordered downsizing to CFS # 4 today--tolerated well -continue ATC w/ pulmonary hygiene -begin day time plugging of trach tomorrow. Decannulate soon.  -Trial of capping with respiratory- doing well with this, continue 12. T2DM: Monitor BS ac/hs and use SSI for elevated BS.              --continue tube feeds with supplements between meals due to poor intake             --continue liberalized diet for now. -09/04/22 CBGs looking great, Changed SSI and CBG checks to TID AC + QHS to minimize interruptions overnight, since tube feeds are decreased/temporarily stopped currently; if tube feeds continued longer term, consider changing back to  q4h if desired -2/20 well controlled CBG (last 3)  Recent Labs    09/06/22 1138 09/06/22 2105 09/07/22 0609  GLUCAP 170* 118* 104*     13. Hypokalemia: Recurrent likely due to diarrhea/intake.              --will add standing dose BID -->recheck labs on 02/17 and 02/19  -09/04/22 K+ 4.0, continue Klor 46mq BID, monitor on weekly labs -09/05/22 doesn't like Klor packet but no great alternative (Phos-Nak would have to be heavily dosed to get equivalent K+, so only other alternative is IV-- will hold off for now) -2/19 Increase K+ supplement to 471m BID, could consider change to tablet  however this is a large tablet and she had trouble swallowing it previously so continue packet for now -2/20, discussed with pharmacy, change to tablet potassium, IV K+ ordered , daily labs 14. Anemia of critical illness: Hgb 7.5-8.9 range -09/04/22 Hgb stable at 8.6, continue to monitor on weekly labs starting 09/06/22 15. Hyponatremia/hypochloremia: Improving 125/87-->130/94.  -09/04/22 Na 129, Cl 95, both stable, continue salt tabs 1g BID -Cortisol testing done during hospitalization (13.8 on 08/30/22), was normal, but daughter and pt want to make sure this is followed up--defer to weekday team regarding ongoing ?Addison's testing  2/20 stable at 131 16. Encephalopathy/Delirium: Continues to have intermittent confusion--but much improved compared to 3 days ago per family.  --Continue delirium precautions. 17. Loose stools likely d/t TF and abx.             -reducing TF as above.  -add probiotics and fiber-- fiber changed to Nutrisource per RD on 09/04/22 since this should cause less clogging of Cortrak -Lomotil 1-2 tabs QID PRN -09/05/22 Cortrak still clogged, taking PO better, and diarrhea seems to be slowing-- reassess needs depending on Cortrak removal timing -2/20 appears improving since tube feeds stopped 18. Hypothyroidism: continue synthroid 8881mQD 19. Chronic combined CHF, BLE Swelling: recent 2D echo done 08/20/2022 showed apical hypokinesis, LVEF 40-45%, RV function was reduced. Intermittently diuresed during hospitalization, most recently given 43m79m lasix on 09/03/22 -09/04/22 noted to still have BLE swelling, start 20mg43mlasix and monitor lytes/BP/swelling -09/05/22 stable swelling, no worse today; cont lasix; daily weights ordered but not done (also, weight will be skewed because of all her equipment) 2/20 ask nursing to check wt  [facility]   09/03/22 1941  Weight: 71.9 kg      LOS: 4 days A FACE TO FACE EVALUATION WAS PERFORMED  Suni Jarnagin Jennye Boroughs/2024, 8:20 AM

## 2022-09-08 DIAGNOSIS — S31000A Unspecified open wound of lower back and pelvis without penetration into retroperitoneum, initial encounter: Secondary | ICD-10-CM

## 2022-09-08 DIAGNOSIS — E119 Type 2 diabetes mellitus without complications: Secondary | ICD-10-CM | POA: Diagnosis not present

## 2022-09-08 DIAGNOSIS — M4626 Osteomyelitis of vertebra, lumbar region: Secondary | ICD-10-CM | POA: Diagnosis not present

## 2022-09-08 DIAGNOSIS — E871 Hypo-osmolality and hyponatremia: Secondary | ICD-10-CM | POA: Diagnosis not present

## 2022-09-08 DIAGNOSIS — Z930 Tracheostomy status: Secondary | ICD-10-CM | POA: Diagnosis not present

## 2022-09-08 LAB — GLUCOSE, CAPILLARY
Glucose-Capillary: 130 mg/dL — ABNORMAL HIGH (ref 70–99)
Glucose-Capillary: 118 mg/dL — ABNORMAL HIGH (ref 70–99)
Glucose-Capillary: 117 mg/dL — ABNORMAL HIGH (ref 70–99)
Glucose-Capillary: 159 mg/dL — ABNORMAL HIGH (ref 70–99)

## 2022-09-08 LAB — BASIC METABOLIC PANEL
Anion gap: 6 (ref 5–15)
BUN: 6 mg/dL — ABNORMAL LOW (ref 8–23)
CO2: 24 mmol/L (ref 22–32)
Calcium: 8.5 mg/dL — ABNORMAL LOW (ref 8.9–10.3)
Chloride: 98 mmol/L (ref 98–111)
Creatinine, Ser: <0.3 mg/dL — ABNORMAL LOW (ref 0.44–1.00)
Glucose, Bld: 117 mg/dL — ABNORMAL HIGH (ref 70–99)
Potassium: 3.7 mmol/L (ref 3.5–5.1)
Sodium: 128 mmol/L — ABNORMAL LOW (ref 135–145)

## 2022-09-08 MED ORDER — POTASSIUM CHLORIDE CRYS ER 20 MEQ PO TBCR
20.0000 meq | EXTENDED_RELEASE_TABLET | Freq: Two times a day (BID) | ORAL | Status: DC
  Administered 2022-09-08 – 2022-09-10 (×5): 20 meq via ORAL
  Filled 2022-09-08 (×5): qty 1

## 2022-09-08 MED ORDER — POTASSIUM CHLORIDE CRYS ER 20 MEQ PO TBCR: 20.0000 meq | EXTENDED_RELEASE_TABLET | Freq: Three times a day (TID) | ORAL | Status: DC

## 2022-09-08 MED ORDER — CHLORHEXIDINE GLUCONATE CLOTH 2 % EX PADS
6.0000 | MEDICATED_PAD | Freq: Two times a day (BID) | CUTANEOUS | Status: DC
  Administered 2022-09-08 – 2022-09-12 (×7): 6 via TOPICAL

## 2022-09-08 NOTE — Progress Notes (Signed)
Patient ID: ISELLA KARSON, female   DOB: January 17, 1956, 67 y.o.   MRN: [id number]  Met with pt and daughter-Laine who was present in Mom's room to give team conference update goals of min-mod and no target discharge set yet due to want to wait until next week to set a target after more time to evaluate pt. Pt has made much gains in three days with wound vac removed, NG tube discharged and hopefully next trach being discharged since already capped. Pt reports she had a melt down earlier and did not want to do therapies. She was allowed to rest and take a nap and she was a new person. Discussed the importance of resting and may need to nap in between therapy sessions to build her strength back up. Pt will do her best and at times is tired of all of this. It has been a long road and this is normal for her to feel this way. She feels she is where she needs to be and daughter agrees with this. Will continue to work on providing support and working on discharge needs.

## 2022-09-08 NOTE — Consult Note (Signed)
[place] Nurse wound follow up Alarm for [facility] sounding yesterday after therapy. Instructed bedside RN to remove NPWT dressing and place NS moist gauze dressing.  Wound type: lumbar wound Measurement: 12 cm suture line with slough at proximal and distal ends Wound bed:15% slough.  There is no appreciable depth today.  Drainage (amount, consistency, odor) minimal serosanguinous  no odor Periwound:intact  some maceration noted Dressing procedure/placement/frequency: Cleanse lumbar wound with NS and pat dry. Apply strip of Aquacel Ag and cover with gauze and ABD pad and tape.  Change daily. Will check weekly.  Estrellita Ludwig MSN, RN, FNP-BC CWON Wound, Ostomy, Continence Nurse [facility] [phone] Pager [phone]

## 2022-09-08 NOTE — Progress Notes (Signed)
PROGRESS NOTE   Subjective/Complaints:  Dietician added ensure TID. Rectal tube out- she is ok with restarting. Wound vac discontinued by WOC.  Reports back pain but due for oxycodone.    ROS: +diarrhea-improved, +back pain, +insomnia/poor sleep- a little better, +LE swelling. Denies fevers, chills, cough, CP, SOB, abd pain, N/V/C, new/worsening paresthesias/weakness, or any other complaints at this time.    Objective:   No results found. Recent Labs    09/06/22 0418  WBC 7.1  HGB 9.1*  HCT 28.6*  PLT 398    Recent Labs    09/07/22 0208 09/08/22 0431  NA 131* 128*  K 2.9* 3.7  CL 99 98  CO2 22 24  GLUCOSE 94 117*  BUN 5* 6*  CREATININE 0.33* <0.30*  CALCIUM 8.4* 8.5*    Intake/Output Summary (Last 24 hours) at 09/08/2022 0839 Last data filed at 09/08/2022 0600 Gross per 24 hour  Intake --  Output 7100 ml  Net -7100 ml         Physical Exam: Vital Signs Blood pressure (!) 141/76, pulse 82, temperature 98.3 F (36.8 C), temperature source Oral, resp. rate 18, height 5' 1"$  (1.549 m), weight 65 kg, SpO2 100 %.  Constitutional:  thin, , NAD, eating breakfast HENT: [place]/AT, Cortrak in place in L nare, MMM.   Eyes: Conjugate gaze, conjunctiva clear, PERRL Neck:     Comments: #6 trach in place. Pt able to speak around trach fairly well. Excellent phonation with trach capped Cardiovascular: RRR, soft murmur heard (VSD), 1+ BLE swelling-stable Pulmonary: CTAB no w/r/r, no increased WOB Abdominal: Soft, nontender, nondistended, positive bowel sounds/normal active Genitourinary:    Comments: Rectal tube not in place, foley in place.    PRIOR EXAM: Musculoskeletal:        General: Swelling present.     Cervical back: Normal range of motion.     Comments: Right leg discrepancy with weakness and leg rotated outward. Dependent edema Right forearm and hand and left forearm.   Skin:    Comments: Well healed old  right thigh incision. Wound VAC in place, low back  Neurological:     Mental Status: She is alert and oriented to person, place, and time.     Comments: Alert and oriented x 3 (needed some help with day) much improved insight and awareness. [place] Memory. Normal language and speech. Cranial nerve exam unremarkable. UE 4/5 prox to distal. RLE 2-/5 prox to 4/5 distally. LLE 2+/5 prox to 4/5 distally. Senses pain in all 4 limbs. No abnl resting tone.   Psychiatric:        Mood and Affect: Mood normal.        Behavior: Behavior normal.   Assessment/Plan: 1. Functional deficits which require 3+ hours per day of interdisciplinary therapy in a comprehensive inpatient rehab setting. Physiatrist is providing close team supervision and 24 hour management of active medical problems listed below. Physiatrist and rehab team continue to assess barriers to discharge/monitor patient progress toward functional and medical goals  Care Tool:  Bathing  Bathing activity did not occur: Refused           Bathing assist  Upper Body Dressing/Undressing Upper body dressing   What is the patient wearing?: Hospital gown only    Upper body assist Assist Level: Total Assistance - Patient < 25%    Lower Body Dressing/Undressing Lower body dressing      What is the patient wearing?: Hospital gown only     Lower body assist Assist for lower body dressing: Total Assistance - Patient < 25%     Toileting Toileting Toileting Activity did not occur Landscape architect and hygiene only): N/A (no void or bm) (using catheter and rectal tube)  Toileting assist Assist for toileting: Dependent - Patient 0%     Transfers Chair/bed transfer  Transfers assist  Chair/bed transfer activity did not occur: Safety/medical concerns        Locomotion Ambulation   Ambulation assist   Ambulation activity did not occur: Safety/medical concerns          Walk 10 feet activity   Assist  Walk 10 feet  activity did not occur: Safety/medical concerns        Walk 50 feet activity   Assist Walk 50 feet with 2 turns activity did not occur: Safety/medical concerns         Walk 150 feet activity   Assist Walk 150 feet activity did not occur: Safety/medical concerns         Walk 10 feet on uneven surface  activity   Assist Walk 10 feet on uneven surfaces activity did not occur: Safety/medical concerns         Wheelchair     Assist Is the patient using a wheelchair?: Yes Type of Wheelchair: Manual Wheelchair activity did not occur: Safety/medical concerns         Wheelchair 50 feet with 2 turns activity    Assist    Wheelchair 50 feet with 2 turns activity did not occur: Safety/medical concerns       Wheelchair 150 feet activity     Assist  Wheelchair 150 feet activity did not occur: Safety/medical concerns       Blood pressure (!) 141/76, pulse 82, temperature 98.3 F (36.8 C), temperature source Oral, resp. rate 18, height 5' 1"$  (1.549 m), weight 65 kg, SpO2 100 %.  Medical Problem List and Plan: 1. Functional deficits secondary to osteomyelitis,infection of lumbar surgical site complicated by meningitis/ventriculitis.             -pt also with significant deconditioning             -patient may not yet shower             -ELOS/Goals: 20-27 days, supervision to min assist with PT, OT, SLP  -Continue CIR, PT OT SLP  -Team conference today please see physician documentation under team conference tab, met with team  to discuss problems,progress, and goals. Formulized individual treatment plan based on medical history, underlying problem and comorbidities.    2.  Antithrombotics: -DVT/anticoagulation:  Pharmaceutical: Lovenox 51m QD             -antiplatelet therapy: N/a 3. Pain Management: Butrans 571m/hr patch for pain control w/ Flexeril 35m62mID, Tylenol PRN, Oxycodone 7.35mg21mh PRN  -09/04/22 increased flexeril to 7.35mg 74m, monitor for  effect -09/05/22 still having some pain, will schedule Oxycodone 35mg q83mand leave 2.35mg q446mRN dosing-- she's been getting at least 135mg da50mwith the PRNs, but doesn't feel adequate control because she's having to ask for it and feels overwhelmed by all the meds/lines/tubes/etc. Hopefully scheduling will  help-- monitor for improvement.   4. Mood/Behavior/Sleep: LCSW to follow for evaluation and support.             --Trazodone 168m at bedtime for  insomnia/mood  -09/04/22 placed sign on door to minimize interruptions 11p-5a; changed CBG checks as below to minimize interruptions             -antipsychotic agents:  Luvox 1055mQHS and Lamictal 2005mD  -Continue Aricept 44m24m 5. Neuropsych/cognition: This patient is not fully capable of making decisions on her own behalf. 6. Skin/Wound Care: WOC to follow for wound VAC changes --MASD managed by fecal system-- flexiseal clogged 09/04/22 AM but nursing states it's now working; monitor--might be able to d/c soon? -2/21 WOC stopped VAC, start aquacel and dressing 7. Fluids/Electrolytes/Nutrition: Monitor I/O. Continue tube feeds-->likely contributing to diarrhea             -pt had unrecorded intake today             -both she and daughters would like to transition from NGT             -will cut TF in half tonight             -monitor intake closely, continue vitamins             -09/04/22 CMP fairly stable, monitor weekly labs starting 09/06/22 -Cortrak clogged this morning 09/04/22, RD made recs for unclogging, nursing to attempt to continue unclogging overnight-- if unsuccessful, nursing will let me know, and pt will need cortrak team consult on Monday; since it's still not functional, will give gentle IVFs overnight, recheck Cortrak status in AM; push PO intake today -09/05/22 PO intake picking up with outside food, Cortrak still clogged-- will unhook but leave in place for now, weekday team to decide on necessity, pt hopeful for removal. IF Cortrak  needed, place consult for Cortrak team to assess unclogging it. Doubt need for IVFs tonight, encourage PO fluids (has multiple beverages at bedside).   2/19 discontinue core track 8.Sepsis w/ osteomyelitis/meningitis/encephalitis: Continue Cefepime 2g q8h, Vanc 1250mg45mh, and Flagyl 500mg 43mfor at least 6 weeks             --end date per ID. Will follow up on plan during admit  9. H/o PVT:  Monitor BP/heart rate TID--on coreg 12.5mg BI59mLabetaolol 44mg q215mN  -09/04/22 BP/HR fairly well controlled, cont regimen, monitor  -2/19 consider increase BP medications tomorrow if continues to be elevated     09/08/2022    5:00 AM 09/08/2022    3:34 AM 09/08/2022   12:37 AM  Vitals with BMI  Weight 143 lbs 5 oz    BMI 27.09    Pulse  82 88      10. Urinary retention: Has failed 4 voiding trails. Has had foley since 12/23             -maintain foley cath, foley mgt per protocol -- Continue Flomax 0.4mg qPM.36mll d/c urecholine.  11. VDRF s/p trach: Tolerating PMSV well -I ordered downsizing to CFS # 4 today--tolerated well -continue ATC w/ pulmonary hygiene -begin day time plugging of trach tomorrow. Decannulate soon.  -Trial of capping with respiratory-  can trial 24 hour capping today 12. T2DM: Monitor BS ac/hs and use SSI for elevated BS.              --continue tube feeds with supplements between meals due to poor intake             --  continue liberalized diet for now. -09/04/22 CBGs looking great, Changed SSI and CBG checks to TID AC + QHS to minimize interruptions overnight, since tube feeds are decreased/temporarily stopped currently; if tube feeds continued longer term, consider changing back to q4h if desired -2/21 stable CBG (last 3)  Recent Labs    09/07/22 1717 09/07/22 2037 09/08/22 0625  GLUCAP 119* 102* 130*     13. Hypokalemia: Recurrent likely due to diarrhea/intake.              --will add standing dose BID -->recheck labs on 02/17 and 02/19  -09/04/22 K+ 4.0,  continue Klor 59mq BID, monitor on weekly labs -09/05/22 doesn't like Klor packet but no great alternative (Phos-Nak would have to be heavily dosed to get equivalent K+, so only other alternative is IV-- will hold off for now) -2/19 Increase K+ supplement to 46m BID, could consider change to tablet however this is a large tablet and she had trouble swallowing it previously so continue packet for now -2/20, discussed with pharmacy, change to tablet potassium, IV K+ ordered , daily labs -2/21 K+ up to 3.7, continue oral potassium supplmenet 14. Anemia of critical illness: Hgb 7.5-8.9 range -09/04/22 Hgb stable at 8.6, continue to monitor on weekly labs starting 09/06/22 15. Hyponatremia/hypochloremia: Improving 125/87-->130/94.  -09/04/22 Na 129, Cl 95, both stable, continue salt tabs 1g BID -Cortisol testing done during hospitalization (13.8 on 08/30/22), was normal, but daughter and pt want to make sure this is followed up--defer to weekday team regarding ongoing ?Addison's testing  2/21 129, a little decreased, fluid restriction started 16. Encephalopathy/Delirium: Continues to have intermittent confusion--but much improved compared to 3 days ago per family.  --Continue delirium precautions. 17. Loose stools likely d/t TF and abx.             -reducing TF as above.  -add probiotics and fiber-- fiber changed to Nutrisource per RD on 09/04/22 since this should cause less clogging of Cortrak -Lomotil 1-2 tabs QID PRN -09/05/22 Cortrak still clogged, taking PO better, and diarrhea seems to be slowing-- reassess needs depending on Cortrak removal timing -2/21 improved, rectal tube to help keep wound clean 18. Hypothyroidism: continue synthroid 8814mQD 19. Chronic combined CHF, BLE Swelling: recent 2D echo done 08/20/2022 showed apical hypokinesis, LVEF 40-45%, RV function was reduced. Intermittently diuresed during hospitalization, most recently given 75m84m lasix on 09/03/22 -09/04/22 noted to still have  BLE swelling, start 20mg46mlasix and monitor lytes/BP/swelling 2/21 weight appears decreased, continue to monitor  Filed Weights   09/03/22 1941 09/08/22 0500  Weight: 71.9 kg 65 kg      LOS: 5 days A FACE TO FACE EVALUATION WAS PERFORMED  Jerian Morais Jennye Boroughs/2024, 8:39 AM

## 2022-09-08 NOTE — Progress Notes (Signed)
Pt trach capped this morning prior to RT arrival. Pt tolerating well at this time. RT will continue to monitor and be available as needed.

## 2022-09-08 NOTE — Patient Care Conference (Signed)
Inpatient RehabilitationTeam Conference and Plan of Care Update Date: 09/08/2022   Time: 12:08 PM    Patient Name: [name]      Medical Record Number: [id number]  Date of Birth: 07/20/1955 Sex: Female         Room/Bed: 4W14C/4W14C-01 Payor Info: Payor: [place] / Plan: BCBS MEDICARE / Product Type: *No Product type* /    Admit Date/Time:  09/03/2022  6:18 PM  Primary Diagnosis:  Infection of lumbar spine [facility])  Hospital Problems: Principal Problem:   Infection of lumbar spine [facility])    Expected Discharge Date: Expected Discharge Date:  (D/C pending)  Team Members Present: Physician leading conference: Dr. Jennye Boroughs Social Worker Present: Ovidio Kin, LCSW Nurse Present: Dorien Chihuahua, RN PT Present: Becky Sax, PT;Rosita Dechalus, PTA OT Present: Laverle Hobby, OT SLP Present: Weston Anna, SLP     Current Status/Progress Goal Weekly Team Focus  Bowel/Bladder     Foley in place, flexi seal in place     Continent of bowel and bladder    Flexi seal replaced, continue w foley for now  Swallow/Nutrition/ Hydration   reg/thin diet mod I   independent  f/u on diet tolerance x1 before signing off for swallowing    ADL's   Mod A UB ADls, total A LB, CGA sitting balance, max +2 sit <> stand and transfers   min-mod A overall   ADLs, transfers, sitting balance, strengthening    Mobility   modA bed mobility, sitting balance CGA to close supervision, sit to stand max to total A x 2   min/modA overall  transfers, standing tolerance, pre-gait, d/c planning    Communication                Safety/Cognition/ Behavioral Observations  mod A   sup-to-min A   intellectual/emergent awareness, sustained attention, memory with use of compensations, problem solving, pt/family education    Pain      Pain and soreness managed with prn medications    Pain < 4 with prns    Assess need for and effectiveness of medications  Skin       Trach, wound vac, yeast to buttocks      Remove wound vac; new dressing change orders, Gerehardt's cream to buttocks, Trach capping for decannulation     Discharge Planning:  Plan home with family and hired assist-it has been a long road for pt and she is somewhat frustrated understandablely so. Very involved-supportive family   Team Discussion: Patient  with chronic back pain; admitted post infection of spine. Demonstrates improved sitting balance but has poor insight into deficits, poor awareness, decreased attention and recall.   Patient on target to meet rehab goals: yes, currently needs mod assist for upper body care and total assist for lower body care. Needs CGA for sitting and max assist + 2 for sit - stand. Requires mod - max assist for supine - sit. Goals for discharge set for min - mod assist overall; needs assistance PTA.  *See Care Plan and progress notes for long and short-term goals.   Revisions to Treatment Plan:  Trach weaning for decannulation Wound vac removal; dressing changes to back   Teaching Needs: Safety, cues for cognition, medication, skin care/wound care, transfers, toileting, etc.   Current Barriers to Discharge: Decreased caregiver support, Home enviroment access/layout, Trach, Incontinence, Neurogenic bowel and bladder, and Wound care  Possible Resolutions to Barriers: Family education     Medical Summary Current Status:  osteomyelitis, meningitis/ventriculitis, pain, hyperkalemia, hyponatremia, wound, CHF, DM, trach  Barriers to Discharge: Medical stability;Self-care education;Trach;Electrolyte abnormality  Barriers to Discharge Comments: osteomyelitis, meningitis/ventriculitis, pain, hyperkalemia, hyponatremia, wound, CHF, DM, trach Possible Resolutions to [employer] Focus: wound vac removed, trach capped, pain medications   Continued Need for Acute Rehabilitation Level of Care: The patient requires daily medical management by a physician  with specialized training in physical medicine and rehabilitation for the following reasons: Direction of a multidisciplinary physical rehabilitation program to maximize functional independence : Yes Medical management of patient stability for increased activity during participation in an intensive rehabilitation regime.: Yes Analysis of laboratory values and/or radiology reports with any subsequent need for medication adjustment and/or medical intervention. : Yes   I attest that I was present, lead the team conference, and concur with the assessment and plan of the team.   Dorien Chihuahua B 09/08/2022, 2:45 PM

## 2022-09-08 NOTE — Progress Notes (Signed)
Physical Therapy Session Note  Patient Details  Name: [name] MRN: [id number] Date of Birth: 06-May-1956  Today's Date: 09/08/2022 PT Individual Time: 1047-1150 PT Individual Time Calculation (min): 63 min   Short Term Goals: Week 1:  PT Short Term Goal 1 (Week 1): Pt will perform bed mobility with MaxA +1. PT Short Term Goal 2 (Week 1): Pt will maintain unsupported seated balance with overall CGA for >5 min. PT Short Term Goal 3 (Week 1): Pt will perform sit<>stand with MaxA +1. PT Short Term Goal 4 (Week 1): Pt will perform seat-to-seat transfers with MaxA +1 PT Short Term Goal 5 (Week 1): Pt will initiate pre-gait training.  Skilled Therapeutic Interventions/Progress Updates: Pt presented in bed with MD present agreeable to therapy. Pt c/o pain 8/10 in back with nsg notified and pain meds received during session. Pt agreeable to sit EOB and work on sitting balance and core activation activities. Pt aware did not have good session with OT previously but states feels better after nap. Pt required AA for BLE management and was able to slide BLE towards EOB with modA. Pt was able to turn towards bed rail and use bed rail to assist in pulling up to EOB. PTA providing heavy modA to have pt come to more upright position and then total A with use of chux pad to rotate pt to EOB. Pt initially requiring maxA due to R posterior lateral lean. Pt was able to come to full upright with modA with pt c/o increased dizziness. Pt then leaned to R on [place] and symptoms resolved within a min. PTA then assisted pt to full upright and pt initially requiring minA to maintain upright posture. With increased time pt was able to progress to CGA and maintained unsupported sitting with supervision for ~20 min. In sitting pt participated in gentle sitting activities including: LAQ with PTA providing AA tom complete range, hamstring pulls with yellow theraband, hip er with yellow theraband, and heel/toe rises all  performed x 10 bilaterally. Pt also worked on reaching tasks with minimal challenges but PTA encouraging pt to increase forward reach. Pt was able to successfully reach forward, slightly high, and crossing midline without LOB. Participated in catch with beach ball for challenge for dynamic sitting balance however pt only attempting catches with minimal challenge. Pt returned to bed via logroll technique and instructing pt to hold only bed rail. Pt required increased time to follow instruction however was able to transfer to supine with maxA. Pt required +2 dependent with use of chux to bring pt to [facility]. Pt left resting in bed with bed alarm on, call bell within reach and needs met.      Therapy Documentation Precautions:  Precautions Precautions: Fall, Back Precaution Comments: trach, cortrack, flexiseal, wound vac on back, using PMSV, rectal tube, urinary catheter Required Braces or Orthoses: Other Brace Other Brace: prevlon boots Restrictions Weight Bearing Restrictions: No Other Position/Activity Restrictions: external rotation R hip at baseline at all times, RLE shorter than LLE, pt ambulated household distances PTA, w/c for community General: PT Amount of Missed Time (min): 12 Minutes PT Missed Treatment Reason: Patient fatigue Vital Signs: Therapy Vitals Temp: 97.8 F (36.6 C) Temp Source: Oral Pulse Rate: 80 Resp: 16 BP: (!) 149/84 Patient Position (if appropriate): Lying Oxygen Therapy SpO2: 100 % O2 Device: Tracheostomy Collar Pain:   Mobility:   Locomotion :    Trunk/Postural Assessment :    Balance:   Exercises:   Other Treatments:  Therapy/Group: Individual Therapy  Pairlee Sawtell 09/08/2022, 1:52 PM

## 2022-09-08 NOTE — Progress Notes (Signed)
Occupational Therapy Session Note  Patient Details  Name: [name] MRN: [id number] Date of Birth: 1956/04/28  Today's Date: 09/08/2022 OT Individual Time: [id number] OT Individual Time Calculation (min): 30 min  and Today's Date: 09/08/2022 OT Missed Time: 40 Minutes Missed Time Reason: Patient fatigue   Short Term Goals: Week 1:  OT Short Term Goal 1 (Week 1): Pt will tolerate sitting EOB for 10 minutes with CGA to engage in UB bathing. OT Short Term Goal 2 (Week 1): Pt will be able to bridge hips in bed with mod A to enable placement of pad or brief as needed. OT Short Term Goal 3 (Week 1): Pt will be able to use BUE to wash UB with mod A. OT Short Term Goal 4 (Week 1): Pt will be able to push to stand with max A of 2 and push up to full stand for at least 30 seconds. OT Short Term Goal 5 (Week 1): Pt will demonstrate improved attention to focus on 1 ADL task with min cues.  Skilled Therapeutic Interventions/Progress Updates:    Pt received supine with un-rated pain at rest but requesting additional pain medication- which OT informed LPN [place] of. Discussed events of last night, including removal of the rectal tube. Pt visibly upset/frustrated re feeling like "nothing ever goes right". Provided emotional support and encouragement with focus on physical gains that pt has made already in rehab. [place] nurse entered for wound change on her back. Pt required mod cueing for sequencing bed mobility to roll R. Once sidelying she required intermittent min A to maintain. Brief also checked and clean. Discussed plan for session for the day and pt becoming more frustrated. Pt clearly overwhelmed with long events of her hospitalization, confounded with cognitive deficits that make it difficult for her to conceptualize both timing/length of events and to clearly assess her future plans/goals. Her daughter [name] was supportive and frank with her in need to participate with therapy. Pt declining d/t  fatigue and lack of sleep. 40 min of therapy missed. Pt very polite and apologetic to OT, stating "I promise it's not you, I'm just so tired". Will attempt to make up as schedule allows.   Therapy Documentation Precautions:  Precautions Precautions: Fall, Back Precaution Comments: trach, cortrack, flexiseal, wound vac on back, using PMSV, rectal tube, urinary catheter Required Braces or Orthoses: Other Brace Other Brace: prevlon boots Restrictions Weight Bearing Restrictions: No Other Position/Activity Restrictions: external rotation R hip at baseline at all times, RLE shorter than LLE, pt ambulated household distances PTA, w/c for community  Therapy/Group: Individual Therapy  Curtis Sites 09/08/2022, 6:08 AM

## 2022-09-08 NOTE — Progress Notes (Signed)
Speech Language Pathology Daily Session Note  Patient Details  Name: DENASIA WHISLER MRN: [id number] Date of Birth: [birth year]  Today's Date: 09/08/2022 SLP Individual Time: [id number] SLP Individual Time Calculation (min): 55 min  Short Term Goals: Week 1: SLP Short Term Goal 1 (Week 1): Pt will tolerate current diet with no overt s/sx of aspiration. SLP Short Term Goal 2 (Week 1): Pt will tolerate speaking valve and recall how to remove speaking valve in case of an emergency independently. SLP Short Term Goal 3 (Week 1): Pt will use external memory aids to recall important information with minA verbal cues. SLP Short Term Goal 4 (Week 1): Pt will recall 2 areas of cognitive impairment with minA verbal cues. SLP Short Term Goal 5 (Week 1): Pt will participate in therapeutic task for 20 minutes with minA verbal cues.  Skilled Therapeutic Interventions: Pt seen this date for skilled ST intervention targeting swallowing and cognitive goals outlined above. Pt received supine in bed, eyes closed; aroused to SLP greeting. Agreeable to intervention at bedside. Reports fatigue from poor sleep last evening, though was able to rest between OT attempt and PT. Daughter present initially, and then towards end of session. Pt capped for the duration of today's session with SpO2 and HR remaining within acceptable parameters throughout; respirations even and unlabored and vocal quality strong and clear.  Today's session with emphasis on use of cognitive skills within leisure context and diet tolerance monitoring. To improve self-advocacy + agency and build therapeutic rapport, SLP reviewed ST POC and rationale for therapy given pt's apathetic state towards ST intervention. Following education, pt became more participatory in therapy. When given choices for tasks to address, pt chose to engage in familiar, leisure activity that also addressed cognitive goals (e.g. UNO game). Pt participated in game of UNO given  intermittent Min A verbal cues to self-monitoring and correcting when incorrect card was played or if she was beginning to draw a card and did not need to. Increased instances of cueing required as task progressed beyond 5 minutes due to fatigue, with pt endorses "I am just tired." Provided education re: the importance of therapeutic rest and provided accordingly.   Additionally, reviewed pt's current medications and addressed short-term recall with use of spaced retrieval and errorless learning interventions with pt recalling function of three new medications with Min A on 1st round, and Sup A on subsequent rounds following delay with distractions.   During rest breaks, pt encouraged to drink her nutritional supplement, which she consumed via straw with set-up assistance - no overt s/sx concerning for airway intrusion noted. Benefited from re-education re: why nutrtional supplementation is encouraged and recommended given poor emergent awareness. Pt politely declined solid textures this date, and reports dislike of hospital food. Encouraged pt's family to bring in pt preferred foods, which they are currently doing.    Pt left in room and in bed with all safety measures activated, call bell reviewed and within reach, and all immediate needs met. Daughter remained at bedside. Continue per current ST POC.   Pain No pain reported; NAD  Therapy/Group: Individual Therapy  Markese Bloxham A Chantavia Bazzle 09/08/2022, 3:06 PM

## 2022-09-08 NOTE — Progress Notes (Signed)
Called to room by assigned NT while performing peri care it was noted that the flexi rectal tube was out, incontinent care provided, peri care performed, and CHG bath completed, Patient was informed of reinsertion of flexi seal rectal tube., 745am Upon entering patient's room to reinsert the rectal tube writer was informed she was not having the rectal tube at this time and for everyone to leave the room until she ' will think about the next step" for her care, Unit Director, assign Nurse and CN informed  [phone] Daughter informed of above situation

## 2022-09-09 DIAGNOSIS — D62 Acute posthemorrhagic anemia: Secondary | ICD-10-CM | POA: Insufficient documentation

## 2022-09-09 LAB — GLUCOSE, CAPILLARY
Glucose-Capillary: 126 mg/dL — ABNORMAL HIGH (ref 70–99)
Glucose-Capillary: 121 mg/dL — ABNORMAL HIGH (ref 70–99)
Glucose-Capillary: 90 mg/dL (ref 70–99)
Glucose-Capillary: 140 mg/dL — ABNORMAL HIGH (ref 70–99)

## 2022-09-09 LAB — BASIC METABOLIC PANEL
Anion gap: 10 (ref 5–15)
BUN: 8 mg/dL (ref 8–23)
CO2: 22 mmol/L (ref 22–32)
Calcium: 8.9 mg/dL (ref 8.9–10.3)
Chloride: 99 mmol/L (ref 98–111)
Creatinine, Ser: 0.38 mg/dL — ABNORMAL LOW (ref 0.44–1.00)
GFR, Estimated: >60 mL/min (ref >60)
Glucose, Bld: 124 mg/dL — ABNORMAL HIGH (ref 70–99)
Potassium: 3.6 mmol/L (ref 3.5–5.1)
Sodium: 131 mmol/L — ABNORMAL LOW (ref 135–145)

## 2022-09-09 NOTE — Plan of Care (Signed)
  Problem: RH BOWEL ELIMINATION Goal: RH STG MANAGE BOWEL WITH ASSISTANCE Description: STG Manage Bowel with toileting Assistance. Outcome: Not Progressing; rectal tube   Problem: RH BLADDER ELIMINATION Goal: RH STG MANAGE BLADDER WITH ASSISTANCE Description: STG Manage Bladder With total Assistance Outcome: Not Progressing; foley

## 2022-09-09 NOTE — Progress Notes (Signed)
Physical Therapy Session Note  Patient Details  Name: [name] MRN: [id number] Date of Birth: 04/02/1956  Today's Date: 09/09/2022 PT Individual Time: 1100-1200 PT Individual Time Calculation (min): 60 min   Short Term Goals: Week 1:  PT Short Term Goal 1 (Week 1): Pt will perform bed mobility with MaxA +1. PT Short Term Goal 2 (Week 1): Pt will maintain unsupported seated balance with overall CGA for >5 min. PT Short Term Goal 3 (Week 1): Pt will perform sit<>stand with MaxA +1. PT Short Term Goal 4 (Week 1): Pt will perform seat-to-seat transfers with MaxA +1 PT Short Term Goal 5 (Week 1): Pt will initiate pre-gait training.  Skilled Therapeutic Interventions/Progress Updates: Pt presented in bed with son present, requiring mild encouragement to participate in therapy. Pt c/o pain 8/10, rest breaks and repositioning provided throughout session. Session focused on sitting balance, functional mobility, and attempted stands. Pt performed bed mobility with heavy modA and significantly increased time with PTA providing cues for sequencing and pt demonstrated limited initiation with all movement. Pt was able to follow commands and grab bars to assist in turning and transitioning to sitting EOB. Pt with mild posterior lean upon sitting but was able to correct with time. Pt was able to tolerate sitting with close supervision. Pt then participated in [place] (AA to complete range), AA hip flexion, heel rises x 10 bilaterally. Pt then set up with [name] and bed raised significantly high. Pt was able to perform x 2 stands in [place] with pt demonstrating minimal initiation and requiring heavy facilitation at hips to power up thus requiring maxA x 2 to complete stand. On second stand PTA was able to shift weight anteriorly enough to allow pt to sit on perch. Pt was only able to tolerate perch for approx 30 sec then began leaning on crossbar. Pt required maxA x 2 to stand and return to bed. PTA then got  behind pt with pillow placed to give pt 2 min rest. Pt then participated in ball taps with use of 1lb dowel for UE activation x 15. Pt then required max x 2 for sit to supine and was total A x 2 to scoot to [facility] with use of draw sheet. While repositioning sheet PTA noted small leakage from rectal tube. Pt able to roll with maxA x 1 to allow PTA to perform peri-care and change brief. Once completed pt left resting be bed with call bell within reach and current needs met.      Therapy Documentation Precautions:  Precautions Precautions: Fall, Back Precaution Comments: trach, cortrack, flexiseal, wound vac on back, using PMSV, rectal tube, urinary catheter Required Braces or Orthoses: Other Brace Other Brace: prevlon boots Restrictions Weight Bearing Restrictions: No Other Position/Activity Restrictions: external rotation R hip at baseline at all times, RLE shorter than LLE, pt ambulated household distances PTA, w/c for community General:   Vital Signs: Therapy Vitals Temp: 98.1 F (36.7 C) Temp Source: Oral Pulse Rate: 90 Resp: 18 BP: (!) 157/89 Patient Position (if appropriate): Lying Oxygen Therapy SpO2: 100 % O2 Device: Room Air Pain: Pain Assessment Pain Scale: 0-10 Pain Score: 7  Pain Type: Acute pain Pain Location: Head Pain Orientation: Lower Pain Descriptors / Indicators: Aching Pain Frequency: Constant Pain Onset: On-going Pain Intervention(s): Medication (See eMAR) Mobility:   Locomotion :    Trunk/Postural Assessment :    Balance:   Exercises:   Other Treatments:      Therapy/Group: Individual Therapy  [name] 09/09/2022,  4:41 PM

## 2022-09-09 NOTE — Progress Notes (Signed)
Patient uncapped per order, currently on room air tolerating well. Trach collar will be placed at midnight per patient request.

## 2022-09-09 NOTE — Progress Notes (Signed)
Occupational Therapy Session Note  Patient Details  Name: [name] MRN: [id number] Date of Birth: [birth year]  Today's Date: 09/09/2022 OT Individual Time: 1350-1430 OT Individual Time Calculation (min): 40 min    Short Term Goals: Week 1:  OT Short Term Goal 1 (Week 1): Pt will tolerate sitting EOB for 10 minutes with CGA to engage in UB bathing. OT Short Term Goal 2 (Week 1): Pt will be able to bridge hips in bed with mod A to enable placement of pad or brief as needed. OT Short Term Goal 3 (Week 1): Pt will be able to use BUE to wash UB with mod A. OT Short Term Goal 4 (Week 1): Pt will be able to push to stand with max A of 2 and push up to full stand for at least 30 seconds. OT Short Term Goal 5 (Week 1): Pt will demonstrate improved attention to focus on 1 ADL task with min cues.  Skilled Therapeutic Interventions/Progress Updates:     Pt received in bed with unrated back pain. Pt repetitively asking what we are doing for the session and seems to forget/be confused. Agreeable to get OOB into stedy to be up for PT session.  Therapeutic activity Sup>sit with heavy MOD A and cuing for technique. Pt very weak and with POOR initiation overall for any mobility but also demo limited frustration tolerance with therapist cuing for hand placement to raise trunk. MOD of 2 or MAX of 1 for stand from elevated bed then stedy flaps to transfer to the chair. Pt taken to dayroom to see outside through window but also work on anterior weight shift sliding towel across table top to target to engage abcdominals in prep for functional mobility.   Pt left at end of session in [place] with husband in room supervising pt and educated on TIS features to improve sitting tolerance. call light in reach and all needs met   Therapy Documentation Precautions:  Precautions Precautions: Fall, Back Precaution Comments: trach, cortrack, flexiseal, wound vac on back, using PMSV, rectal tube, urinary  catheter Required Braces or Orthoses: Other Brace Other Brace: prevlon boots Restrictions Weight Bearing Restrictions: No Other Position/Activity Restrictions: external rotation R hip at baseline at all times, RLE shorter than LLE, pt ambulated household distances PTA, w/c for community Therapy/Group: Individual Therapy  Tonny Branch 09/09/2022, 6:57 AM

## 2022-09-09 NOTE — Discharge Instructions (Addendum)
Inpatient Rehab Discharge Instructions  [name] Discharge date and time:  10/13/22  Activities/Precautions/ Functional Status: Activity: no lifting, driving, or strenuous exercise till cleared by MD Diet: diabetic diet  Wound Care: Keep wound clean and dry. Contact Dr. Zada Finders if you develop any problems with your incision/wound--redness, swelling, increase in pain, drainage or if you develop fever or chills.  To sacrum:  Apply a layer of desitin to prevent chapping of buttocks.   Back wound care: Cleanse wound to lumbar spine with NS and pat dry.  2.  Using Q Tip, apply EITHER  medihoney or santyl to yellow tissue.   3.  Cut a thin strip of Aquacel and pack into wound depth (tunnels up at 12 o'clock) 4.  Cover wound with silicone foam dressing to protect and pad bony prominence   Functional status:  ___ No restrictions     ___ Walk up steps independently _X__ 24/7 supervision/assistance   ___ Walk up steps with assistance ___ Intermittent supervision/assistance  ___ Bathe/dress independently ___ Walk with walker     _X__ Bathe/dress with assistance ___ Walk Independently    ___ Shower independently _X__ Walk with assistance    ___ Shower with assistance _X__ No alcohol     ___ Return to work/school ________   Special Instructions: Monitor blood sugars before meals and/or at bedtime--take a record of it to your PCP You need to take protein supplement of choice twice a day between or with meals. Juven, Ensure max, Premier protein are examples.  Also recommend Vitamin C 500 mg and Zinc 220 mg daily with lunch to help promote wound healing. Take with food as these can cause nausea.     COMMUNITY REFERRALS UPON DISCHARGE:    Home Health:   PT  OT  SP  RN  AIDE                  Agency: [facility] Phone: [phone]    Medical Equipment/Items Ordered:HAS ALL NEEDED EQUIPMENT FROM PAST ADMISSIONS                                                  Agency/Supplier:NA Has Private Duty List to hire assist for care when family not there.     My questions have been answered and I understand these instructions. I will adhere to these goals and the provided educational materials after my discharge from the hospital.  Patient/Caregiver Signature _______________________________ Date __________  Clinician Signature _______________________________________ Date __________  Please bring this form and your medication list with you to all your follow-up doctor's appointments.   General Healthful Nutrition Therapy  This handout provides you with the information you'll need to follow a general, healthful diet, which can be tailored to your personal preferences. There are several benefits to following a general, healthful diet: It could mean less calories, less salt, less added sugars, and less saturated fat than many other diets. This outcome will depend on the foods you choose. Eating more whole grains, beans, lentils, fruits, vegetables, nuts, and seeds may improve how much fiber, vitamins, and minerals you eat.  It can lower your risk for health conditions like diabetes, heart disease, hypertension, stroke, and cancer. Your registered dietitian nutritionist (RDN) may recommend portion sizes based on your individual needs and personal and cultural preferences.  Tips Every day, eat  a variety of fruits and vegetables in a variety of colors. Be sure to include lots of dark green, red, blue-purple, and orange vegetables. Choose whole grains for at least half of your grain selections. Eat more beans, peas, and lentils. Try meatless alternatives. Get protein in your diet from eggs, fish, poultry, beans, peas, lentils, and nuts/nut butters. Low-fat or fat-free dairy products are also good sources of protein. Keep your salt intake to a minimum (less than 2300 milligrams per day). Limit use of salt, soy sauce, or fish sauce when cooking. Eat freshly  prepared meals at home. Processed, prepackaged, and restaurant foods contain more salt. Choose fresh fruits and vegetables for snacks. Choose products with lower sodium content when grocery shopping. Limit your daily sugar intake. Sugar may be used in sauces, marinades, dressings, and condiments - even those that do not taste sweet.  Sugar can be found in honey, syrups, jelly, fruit juice, and fruit juice concentrate. Limit sugar-sweetened beverages like sodas and fruit juice, sugary snacks, and candy. It's best to choose products without added sugar, but if you do eat them, read labels carefully so you know how much sugar is in each portion. It is better to eat unsaturated fats than saturated fats. Use fats and oils in moderation, up to 5 servings per day. Unsaturated fat is found in fish, avocado, nuts, and oils like sunflower, canola, avocado and olive oils. Saturated fat is found in fatty meat, butter, ice cream, palm and coconut oil, cream, cheese, and lard. Many processed foods, fried foods, fast food items, convenience foods like frozen pizza and snack foods, and sweets including pies, cookies, and other pastries are high in fat. Check nutrition labels and choose these foods less often. Use vegetable oil instead of lard or butter for cooking. Boil, steam, or bake your food instead of deep frying in oil. Remove the fatty part of meats before cooking.  Foods to Choose or to Limit Choose a healthful balance of foods from each food group at your meals. Your RDN may make individualized portion size recommendations based on your needs. Food Group Foods to Choose Foods to Limit  Grains Whole wheat, barley, rye, buckwheat, corn, teff, quinoa, millet, amaranth, brown and wild rice, sorghum, and oats; focus on intact cooked whole grains Grain products, such as bread, rolls, prepared breakfast cereals, crackers, and pasta made from whole grains that are low in added sugars, saturated fat, and sodium  Sweetened, low-fiber breakfast cereals Packaged (high sugar, refined ingredients) baked goods Snack crackers and chips made of refined ingredients, cheese crackers, butter crackers Breads made with refined ingredients and saturated fats, such as biscuits, frozen waffles, sweet breads, doughnuts, pastries, packaged baking mixes, pancakes, cakes, and cookies  Protein Foods Meat, including lean, trimmed cuts of beef, pork, or lamb a few times per week or less Poultry, including skinless chicken or [place] Seafood, including fish, shrimp, lobster, clams, and scallops at least twice per week. Focus on fatty fish, such as salmon, herring, and sardines, as a rich source of omega-3 fatty acids Eggs Nuts and seeds, such as peanuts, almonds, pistachios, and sunflower seeds (unsalted varieties) Nut and seed butters, such as peanut butter, almond butter, and sunflower seed butter (reduced-sodium varieties)  Soy foods such as tofu, tempeh, or soy nuts Plant protein-based meat alternatives, such as veggie burgers and sausages (reduced-sodium varieties) Unsalted beans, lentils, or peas at least a few times per week in place of other protein sources Marbled or fatty red meats (beef, pork, lamb),  such as ribs Processed red meats, such as bacon, sausage, and ham Poultry (chicken and [place]) with skin Fried meats, poultry, or fish [employer], shark, and tilefish (may contain high levels of mercury) Deli meats, such as pastrami, bologna, or salami Fried eggs Salted beans, peas, lentils, nuts, seeds, or nut/seed butters Meat alternatives with high levels of sodium or saturated fat  Dairy and Dairy Alternatives Low-fat or fat-free milk, yogurt (low in added sugars), cottage cheese, and cheeses Fortified soymilk or soy yogurt Whole milk, cream, cheeses made from whole milk, sour cream Yogurt or ice cream made from whole milk or with added sugar Cream cheese made from whole milk  Vegetables A variety of  vegetables, including dark-green, red, blue-purple, and orange vegetables Low-sodium vegetable juices Canned or frozen vegetables with salt, fresh vegetables prepared with salt Fried vegetables Vegetables in cream sauce or cheese sauce Tomato or pasta sauce with high levels of salt or sugar  Fruit A variety of whole fruits, canned fruit packed in water, or dried fruit 100% fruit juice (limited to  cup per day) Fruits packed in syrup or made with added sugar  Fats and Oils Unsaturated vegetable oils, including olive, peanut, and canola oils Vegetable oil-based margarines and spreads Salad dressing and mayonnaise made from unsaturated vegetable oils Solid shortening or partially hydrogenated oils Solid margarine made with hydrogenated or partially hydrogenated oils Butter  Beverages Coffee and tea (unsweetened) Water Sweetened drinks, including sweetened coffee or tea drinks, soda, energy drinks, and sports drinks  Other Prepared foods, including soups, casseroles, salads, baked goods, and snacks made from recommended ingredients, with low levels of added saturated fat, added sugars, or salt Sugary and/or fatty desserts, candy, and other sweets; salt and seasonings that contain salt Fried foods   General, Healthful Diet Sample 1-Day Menu View Nutrient Info Breakfast 1 cup oatmeal  cup blueberries 1 ounce almonds 1 cup 1% milk or fortified soymilk 1 cup unsweetened coffee  Lunch 2 slices whole wheat bread 3 ounces [place] slices 2 lettuce leaves 2 slices tomato 1 ounce reduced-fat, reduced sodium cheese  cup carrot sticks  cup hummus 1 banana 1 cup 1% milk or fortified soymilk 1 cup unsweetened tea  Evening Meal 4 ounces salmon, baked  cup cooked brown rice 1 cup green beans, cooked 1 cup mixed greens salad 1 teaspoon olive oil mixed with vinegar of choice 1 whole wheat dinner roll 1 teaspoon margarine, soft, tub (for roll) 1 cup water  Evening Snack 1 cup low-fat yogurt   cup sliced peaches  Daily Sum Nutrient Unit Value  Macronutrients  Energy kcal 1831  Energy kJ 7658  Protein g 118  Total lipid (fat) g 60  Carbohydrate, by difference g 216  Fiber, total dietary g 32  Sugars, total g 88  Minerals  Calcium, Ca mg 1787  Iron, Fe mg 24  Sodium, Na mg 2123  Vitamins  Vitamin C, total ascorbic acid mg 53  Vitamin A, IU IU 18543  Vitamin D IU 241  Lipids  Fatty acids, total saturated g 15  Fatty acids, total monounsaturated g 25  Fatty acids, total polyunsaturated g 15  Cholesterol mg 172

## 2022-09-09 NOTE — Discharge Summary (Incomplete)
Physician Discharge Summary  Patient ID: [name] MRN: [id number] DOB/AGE: 68-Apr-1957 68 y.o.  Admit date: 09/03/2022 Discharge date: 09/09/2022  Discharge Diagnoses:  Principal Problem:   Infection of lumbar spine ([place]) Active Problems:   VENTRICULAR SEPTAL DEFECT, CONGENITAL   Anxiety   Diabetes mellitus, type 2 (HCC)   Acid reflux   Mild cognitive impairment   Mood disorder (HCC)   Wound dehiscence   Encephalopathy   Malnutrition of moderate degree   Bacterial meningitis   Hardware complicating wound infection ([place])   Acute blood loss anemia   Discharged Condition: {condition:18240}  Significant Diagnostic Studies: DG CHEST PORT 1 VIEW  Result Date: 09/03/2022 CLINICAL DATA:  Borderline low O2 saturation. EXAM: PORTABLE CHEST 1 VIEW COMPARISON:  08/24/2022. FINDINGS: The heart size and mediastinal contours are within normal limits. Tracheostomy tube terminates 3.3 cm above the carina. A right internal jugular central venous catheter terminates over the right atrium. Mild airspace disease is noted at the lung bases. There is a small left pleural effusion. No pneumothorax. Shoulder arthroplasty changes are noted on the left. There is partial visualization of lumbar spinal fusion hardware. An enteric tube courses over the stomach and out of the field of view. IMPRESSION: 1. Mild atelectasis or infiltrate at the lung bases. 2. Small left pleural effusion. Electronically Signed   By: Brett Fairy M.D.   On: 09/03/2022 21:13   MR BRAIN W WO CONTRAST  Result Date: 08/31/2022 CLINICAL DATA:  Meningitis/CNS infection suspected. Follow-up ventricular debris. EXAM: MRI HEAD WITHOUT AND WITH CONTRAST TECHNIQUE: Multiplanar, multiecho pulse sequences of the brain and surrounding structures were obtained without and with intravenous contrast. CONTRAST:  32m GADAVIST GADOBUTROL 1 MMOL/ML IV SOLN COMPARISON:  MRI brain 08/20/2022. FINDINGS: Brain: No acute infarct. Redemonstrated  dependent diffusion signal abnormality in the occipital horn of the left lateral ventricle (image 21 series 2) with associated ependymal enhancement in both occipital horns (images 21-23 series 10). No ventricular dilatation. No other foci of abnormal diffusion signal or enhancement. Unchanged moderate chronic small-vessel disease. Vascular: Unremarkable flow voids within limitations of motion artifact. Skull and upper cervical spine: Normal marrow signal and enhancement. Sinuses/Orbits: Increased right mastoid effusion. No nasopharyngeal mass. Orbits are unremarkable. Other: None. IMPRESSION: Redemonstrated dependent diffusion signal abnormality in the occipital horn of the left lateral ventricle with associated ependymal enhancement in both occipital horns, concerning for ventriculitis. No ventricular dilatation. Electronically Signed   By: WEmmit AlexandersM.D.   On: 08/31/2022 14:44   DG Swallowing Func-Speech Pathology  Result Date: 08/30/2022 Table formatting from the original result was not included. Modified Barium Swallow Study Patient Details Name: Marissa TERUELMRN: [id number] of Birth: 5May 13, 1957Today's Date: 08/30/2022 HPI/PMH: HPI: Patient is a 67y/o female who presents on 1/22 for revision of complex lumbar wound, removal of left L1-L5 lumbar screws and rod, removal of right L1 screw and revision of L1-L5 posterior instrumented fusion. Current admission has been complicated by encephalopathy which initially seemed 2/2 polypharmacy, then thought possible UTI, and Cefadroxil was added 1/26.  CNS depressing medications were also de-escalated.  Despite these measures, Pt become increasingly encephalopathic. 2/1: Admitted to ICU and intubated. 2/2: MRI brain concerning for meningitis. 2/6: Pt was extubated although re-intubated due to fatigue and airway protection; trached. PMH includes GERD, HH, DM, HTN, anxiety, left TSA, arthritis. Esophagram in 2021 revealed previous Nissen fundoplication  as well as moderate to severe esophageal dysmotility. Clinical Impression: Pt demonstrates swallow within functional limits for age with  trace to mild differences that do not impact safety with PO. Pt does have cortrak in place. Also has PMSV in place for exam. Differences in swallow function include initiation of hyoid burst past the ramus of the mandible with most thin and nectar trials. Pt has an instance of trace frank penetration of thin while taking pill due to late and slightly incomplete epiglottic deflection. There is trace residue at times in the base of tongue and vallecular region that clear with subsequent swallows. Pill also lodged in vallecular region as well as in the mid esophageal column. Recommend pt consume regular textured solids with thin liquids with PMSV in place, but take pills whole with puree. Noted trace collection of barium in tissue anterior to the esophagus on image 49 (44/246) and in other trials of liquids. Will report to radiologist for futher review Factors that may increase risk of adverse event in presence of aspiration ([place] 2021): Factors that may increase risk of adverse event in presence of aspiration ([place] 2021): Limited mobility; Frail or deconditioned; Dependence for feeding and/or oral hygiene; Presence of tubes (ETT, trach, NG, etc.) Recommendations/Plan: Swallowing Evaluation Recommendations Swallowing Evaluation Recommendations Recommendations: PO diet PO Diet Recommendation: Regular; Thin liquids (Level 0) Liquid Administration via: Cup; Straw Medication Administration: Whole meds with puree Supervision: Patient able to self-feed Swallowing strategies  : Place PMSV during PO intake; Minimize environmental distractions; Slow rate; Small bites/sips Postural changes: Position pt fully upright for meals; Stay upright 30-60 min after meals Oral care recommendations: Oral care BID (2x/day) Treatment Plan Treatment Plan Treatment recommendations:  Therapy as outlined in treatment plan below Follow-up recommendations: Acute inpatient rehab (3 hours/day) Functional status assessment: Patient has had a recent decline in their functional status and demonstrates the ability to make significant improvements in function in a reasonable and predictable amount of time. Treatment frequency: Min 2x/week Treatment duration: 1 week Interventions: Aspiration precaution training; Patient/family education; Diet toleration management by SLP Recommendations Recommendations for follow up therapy are one component of a multi-disciplinary discharge planning process, led by the attending physician.  Recommendations may be updated based on patient status, additional functional criteria and insurance authorization. Assessment: Orofacial Exam: Orofacial Exam Oral Cavity: Oral Hygiene: WFL Oral Cavity - Dentition: Adequate natural dentition Orofacial Anatomy: WFL Oral Motor/Sensory Function: WFL Anatomy: Anatomy: WFL Thin Liquids: Thin Liquids (Level 0) Thin Liquids : WFL Bolus delivery method: Cup Initiation of swallow : Posterior laryngeal surface of the epiglottis  Mildly Thick Liquids: Mildly thick liquids (Level 2, nectar thick) Mildly thick liquids (Level 2, nectar thick): Impaired Bolus delivery method: Cup Mildly Thick Liquid - Impairment: Oral Impairment; Pharyngeal impairment Lip Closure: Escape from interlabial space or lateral juncture, no extension beyond vermillion border Tongue control during bolus hold: Cohesive bolus between tongue to palatal seal Bolus transport/lingual motion: Brisk tongue motion Oral residue: Trace residue lining oral structures Location of oral residue : Tongue; Palate Initiation of swallow : Valleculae Soft palate elevation: Complete Laryngeal elevation: Complete superior movement of thyroid cartilage with complete approximation of arytenoids to epiglottic petiole Anterior hyoid excursion: Complete Epiglottic movement: Partial Laryngeal vestibule  closure: Complete: No air/contrast in laryngeal vestibule Pharyngeal stripping wave : Present - complete Pharyngeal contraction (A/P view only): Complete Pharyngoesophageal segment opening: Complete distension and complete duration, no obstruction of flow Tongue base retraction: No contrast between tongue base and posterior pharyngeal wall (PPW) Pharyngeal residue: Trace residue within or on pharyngeal structures Location of pharyngeal residue: Valleculae; Pyriform sinuses Penetration/Aspiration Scale (  PAS) score: 1.  Material does not enter airway  Moderately Thick Liquids: Moderately thick liquids (Level 3, honey thick) Moderately thick liquids (Level 3, honey thick): Impaired Bolus delivery method: Spoon Moderately Thick Liquid - Impairment: Pharyngeal impairment; Oral Impairment Lip Closure: Interlabial escape, no progression to anterior lip Tongue control during bolus hold: Cohesive bolus between tongue to palatal seal Bolus transport/lingual motion: Brisk tongue motion Oral residue: Complete oral clearance Location of oral residue : N/A Initiation of swallow : Posterior angle of the ramus Soft palate elevation: Complete Laryngeal elevation: Complete superior movement of thyroid cartilage with complete approximation of arytenoids to epiglottic petiole Anterior hyoid excursion: Complete Epiglottic movement: Partial Laryngeal vestibule closure: Complete: No air/contrast in laryngeal vestibule Pharyngeal stripping wave : Present - complete Pharyngeal contraction (A/P view only): N/A Pharyngoesophageal segment opening: Complete distension and complete duration, no obstruction of flow Tongue base retraction: Trace column of contrast or air between tongue base and PPW Pharyngeal residue: Trace residue within or on pharyngeal structures Location of pharyngeal residue: Valleculae; Tongue base Penetration/Aspiration Scale (PAS) score: 1.  Material does not enter airway  Puree: Puree Puree: Impaired Puree - Impairment:  Pharyngeal impairment Initiation of swallow: Posterior angle of the ramus Soft palate elevation: Complete Laryngeal elevation: Complete superior movement of thyroid cartilage with complete approximation of arytenoids to epiglottic petiole Anterior hyoid excursion: Complete Epiglottic movement: Partial Laryngeal vestibule closure: Complete: No air/contrast in laryngeal vestibule Pharyngeal stripping wave : Present - complete Pharyngeal contraction (A/P view only): Complete Pharyngoesophageal segment opening: Complete distension and complete duration, no obstruction of flow Tongue base retraction: Trace column of contrast or air between tongue base and PPW Pharyngeal residue: Trace residue within or on pharyngeal structures Location of pharyngeal residue: Tongue base; Valleculae Penetration/Aspiration Scale (PAS) score: 1.  Material does not enter airway Solid: Solid Solid: Impaired Solid - Impairment: Oral Impairment; Pharyngeal impairment Lip Closure: No labial escape Bolus preparation/mastication: Timely and efficient chewing and mashing Bolus transport/lingual motion: Brisk tongue motion Oral residue: Trace residue lining oral structures Location of oral residue : Tongue; Palate Initiation of swallow: Posterior angle of the ramus Soft palate elevation: Complete Laryngeal elevation: Complete superior movement of thyroid cartilage with complete approximation of arytenoids to epiglottic petiole Anterior hyoid excursion: Complete Epiglottic movement: Partial Laryngeal vestibule closure: Complete: No air/contrast in laryngeal vestibule Pharyngeal stripping wave : Present - complete Pharyngoesophageal segment opening: Complete distension and complete duration, no obstruction of flow Tongue base retraction: Narrow column of contrast or air between tongue base and PPW Pharyngeal residue: Collection of residue within or on pharyngeal structures Location of pharyngeal residue: Valleculae; Tongue base Penetration/Aspiration  Scale (PAS) score: 1.  Material does not enter airway Pill: Pill Pill: Impaired Consistency administered : thin Pill - Impairment: Oral Impairment; Pharyngeal impairment Lip Closure: Escape from interlabial space or lateral juncture, no extension beyond vermillion border Bolus transport/lingual motion: Brisk tongue motion Oral residue: Complete oral clearance Initiation of swallow : Pyriform sinuses Soft palate elevation: Complete Laryngeal elevation: Complete superior movement of thyroid cartilage with complete approximation of arytenoids to epiglottic petiole Anterior hyoid excursion: Complete Epiglottic movement: Partial Laryngeal vestibule closure: Complete: No air/contrast in laryngeal vestibule Pharyngeal stripping wave : Present - complete Pharyngeal contraction (A/P view only): N/A Pharyngoesophageal segment opening: Complete distension and complete duration, no obstruction of flow Tongue base retraction: Wide column of contrast or air between tongue base and PPW Pharyngeal residue: Majority of contrast within or on pharyngeal structures Location of pharyngeal residue: Valleculae; Pyriform sinuses Penetration/Aspiration  Scale (PAS) score: 5.  Material enters airway, CONTACTS cords and not ejected out Compensatory Strategies: Compensatory Strategies Compensatory strategies: No   General Information: Caregiver present: Yes Investment banker, corporate)  Diet Prior to this Study: NPO; Cortrak/Small bore NG tube   Temperature : Normal   Respiratory Status: WFL   Supplemental O2: Trach Collar   No data recorded Behavior/Cognition: Alert; Distractible Self-Feeding Abilities: Dependent for feeding Baseline vocal quality/speech: Normal Volitional Cough: Able to elicit Volitional Swallow: Able to elicit No data recorded Goal Planning: Prognosis for improved oropharyngeal function: Good Barriers to Reach Goals: Cognitive deficits No data recorded Patient/Family Stated Goal: patient wants a soda Consulted and agree with results and  recommendations: Patient; Nurse Pain: Pain Assessment Pain Assessment: Faces Pain Score: 10 Faces Pain Scale: 4 Breathing: 0 Negative Vocalization: 0 Facial Expression: 0 Body Language: 0 Consolability: 0 PAINAD Score: 0 Facial Expression: 0 Body Movements: 0 Muscle Tension: 0 Compliance with ventilator (intubated pts.): N/A Vocalization (extubated pts.): 0 CPOT Total: 0 Pain Location: back with mobility Pain Descriptors / Indicators: Grimacing; Guarding; Discomfort Pain Intervention(s): Limited activity within patient's tolerance; Monitored during session; Repositioned End of Session: Start Time:SLP Start Time (ACUTE ONLY): 1224 Stop Time: SLP Stop Time (ACUTE ONLY): 1246 Time Calculation:SLP Time Calculation (min) (ACUTE ONLY): 22 min Charges: SLP Evaluations $ SLP Speech Visit: 1 Visit SLP Evaluations $BSS Swallow: 1 Procedure $MBS Swallow: 1 Procedure $ SLP Eval Voice Prosthetic Device: Procedure $$ Passy Muir Speaking Valve: yes $Speech Treatment for Individual: 1 Procedure SLP visit diagnosis: SLP Visit Diagnosis: Dysphagia, unspecified (R13.10) Past Medical History: Past Medical History: Diagnosis Date  Anemia   Anxiety   Arthritis   Depression   GERD (gastroesophageal reflux disease)   Heart murmur   "related to VSD"  High cholesterol   History of blood transfusion   "related to OR" (08/19/2016)  History of hiatal hernia   Hypertension   Hyperthyroidism   Mild cognitive impairment 09/06/2018  Paroxysmal ventricular tachycardia (HCC)   Type II diabetes mellitus ([place])   UTI (urinary tract infection)   being treated with Keflex  Ventricular septal defect  Past Surgical History: Past Surgical History: Procedure Laterality Date  ABDOMINAL HYSTERECTOMY    BACK SURGERY    CARDIAC CATHETERIZATION N/A 06/23/2015  Procedure: Left Heart Cath and Coronary Angiography;  Surgeon: Larey Dresser, MD;  Location: [facility];  Service: Cardiovascular;  Laterality: N/A;  CARDIAC CATHETERIZATION  1960  "VSD was so small;  didn't need repaired"  EXAM UNDER ANESTHESIA WITH MANIPULATION OF HIP Right 06/02/2014  dr Rhona Raider  FRACTURE SURGERY    HERNIA REPAIR    HIP CLOSED REDUCTION Right 06/02/2014  Procedure: CLOSED MANIPULATION HIP;  Surgeon: Hessie Dibble, MD;  Location: [facility];  Service: Orthopedics;  Laterality: Right;  JOINT REPLACEMENT    JOINT REPLACEMENT    POSTERIOR LUMBAR FUSION 4 LEVEL N/A 06/26/2022  Procedure: Lumbar One To Lumbar Five Posterior Instrumented Fusion;  Surgeon: Judith Part, MD;  Location: [facility];  Service: Neurosurgery;  Laterality: N/A;  REFRACTIVE SURGERY Bilateral   SHOULDER ARTHROSCOPY Right   SHOULDER OPEN ROTATOR CUFF REPAIR Right   SPINAL FUSION  1996  "t10 down to my coccyx  SPINE HARDWARE REMOVAL    TOTAL ABDOMINAL HYSTERECTOMY    TOTAL HIP ARTHROPLASTY Right 05/10/2014  [email or web address]      by dr Harrell Gave olcott  TOTAL KNEE ARTHROPLASTY Left   TOTAL SHOULDER ARTHROPLASTY Left 08/19/2016  Procedure: TOTAL SHOULDER ARTHROPLASTY;  Surgeon: Tania Ade,  MD;  Location: [facility];  Service: Orthopedics;  Laterality: Left;  Left total shoulder replacement DeBlois, Katherene Ponto 08/30/2022, 1:44 PM  DG Chest Port 1 View  Result Date: 08/24/2022 CLINICAL DATA:  Status post tracheostomy. EXAM: PORTABLE CHEST 1 VIEW COMPARISON:  August 23, 2022 FINDINGS: Status post tracheostomy tube placement in satisfactory position. Normal heart size. No evidence of pneumothorax. Bilateral pleural effusions with bilateral streaky airspace opacities. Feeding catheter noted. IMPRESSION: Tracheostomy tube in satisfactory position. Persistent bilateral streaky airspace opacities. Electronically Signed   By: Fidela Salisbury M.D.   On: 08/24/2022 15:26   DG CHEST PORT 1 VIEW  Result Date: 08/23/2022 CLINICAL DATA:  Intubation. EXAM: PORTABLE CHEST 1 VIEW COMPARISON:  Chest radiograph dated 08/22/2022. FINDINGS: Interval retraction of the endotracheal tube with tip approximately 4.5 cm above the carina.  Right-sided PICC in similar position. Feeding tube extends below the diaphragm with tip beyond the inferior margin of the image. There is stable cardiomegaly with mild central vascular congestion. Left lung base atelectasis or infiltrate. No large pleural effusion. No pneumothorax. Degenerative changes of the spine and scoliosis. Left shoulder arthroplasty. No acute osseous pathology. IMPRESSION: 1. Interval retraction of the endotracheal tube with tip above the carina. 2. Cardiomegaly with mild central vascular congestion. Electronically Signed   By: Anner Crete M.D.   On: 08/23/2022 20:14   DG CHEST PORT 1 VIEW  Result Date: 08/22/2022 CLINICAL DATA:  Respiratory failure EXAM: PORTABLE CHEST 1 VIEW COMPARISON:  08/19/2022 FINDINGS: Support apparatus: Endotracheal tube terminates 1.8 cm above carina. Feeding tube terminates at the gastric body. Right-sided PICC line tip at mid right atrium. Heart/mediastinum: Mild cardiomegaly. Pleura: Probable small left pleural effusion.  No pneumothorax. Lungs: Interstitial edema is improved to resolved. Persistent left base airspace disease. Other: Left shoulder arthroplasty. IMPRESSION: Cardiomegaly with improved to resolved interstitial edema. Persistent left base airspace disease and probable small left pleural effusion. Endotracheal tube 1.8 cm above carina. Recommend attention on follow-up versus retraction 2-3 cm. Electronically Signed   By: Abigail Miyamoto M.D.   On: 08/22/2022 09:58   ECHOCARDIOGRAM LIMITED  Result Date: 08/20/2022    ECHOCARDIOGRAM LIMITED REPORT   Patient Name:   [name] Murri Date of Exam: 08/20/2022 Medical Rec #:  [id number]           Height:       60.0 in Accession #:    [id number]          Weight:       149.0 lb Date of Birth:  Feb 12, 1956           BSA:          1.647 m Patient Age:    70 years            BP:           104/69 mmHg Patient Gender: F                   HR:           89 bpm. Exam Location:  Inpatient Procedure: Limited  Echo, Color Doppler, Limited Color Doppler and Intracardiac            Opacification Agent Indications:    CHF  History:        Patient has prior history of Echocardiogram examinations, most                 recent 04/13/2019. Risk Factors:Hypertension, Diabetes and  Current Smoker.  Sonographer:    Harvie Junior Referring Phys: [id number] GRACE E BOWSER  Sonographer Comments: Technically difficult study due to poor echo windows and echo performed with patient supine and on artificial respirator. Image acquisition challenging due to respiratory motion and restraints. IMPRESSIONS  1. Abnormal septal motion with septal and apical hypokinesis . Left ventricular ejection fraction, by estimation, is 40 to 45%. The left ventricle has mildly decreased function. The left ventricle has no regional wall motion abnormalities. The left ventricular internal cavity size was mildly dilated. Left ventricular diastolic parameters were normal.  2. Right ventricular systolic function is severely reduced. The right ventricular size is severely enlarged.  3. The mitral valve is abnormal. No evidence of mitral valve regurgitation. No evidence of mitral stenosis.  4. The aortic valve was not well visualized. There is mild calcification of the aortic valve. There is mild thickening of the aortic valve. Aortic valve regurgitation is not visualized. Aortic valve sclerosis is present, with no evidence of aortic valve  stenosis.  5. The inferior vena cava is normal in size with greater than 50% respiratory variability, suggesting right atrial pressure of 3 mmHg. FINDINGS  Left Ventricle: Abnormal septal motion with septal and apical hypokinesis. Left ventricular ejection fraction, by estimation, is 40 to 45%. The left ventricle has mildly decreased function. The left ventricle has no regional wall motion abnormalities. Definity contrast agent was given IV to delineate the left ventricular endocardial borders. The left ventricular  internal cavity size was mildly dilated. There is no left ventricular hypertrophy. Left ventricular diastolic parameters were normal. Right Ventricle: The right ventricular size is severely enlarged. No increase in right ventricular wall thickness. Right ventricular systolic function is severely reduced. Left Atrium: Left atrial size was normal in size. Right Atrium: Right atrial size was normal in size. Pericardium: There is no evidence of pericardial effusion. Mitral Valve: The mitral valve is abnormal. There is mild thickening of the mitral valve leaflet(s). There is mild calcification of the mitral valve leaflet(s). Mild mitral annular calcification. No evidence of mitral valve stenosis. Tricuspid Valve: The tricuspid valve is normal in structure. Tricuspid valve regurgitation is mild . No evidence of tricuspid stenosis. Aortic Valve: The aortic valve was not well visualized. There is mild calcification of the aortic valve. There is mild thickening of the aortic valve. Aortic valve regurgitation is not visualized. Aortic valve sclerosis is present, with no evidence of aortic valve stenosis. Aortic valve mean gradient measures 3.0 mmHg. Aortic valve peak gradient measures 5.7 mmHg. Pulmonic Valve: The pulmonic valve was normal in structure. Pulmonic valve regurgitation is not visualized. No evidence of pulmonic stenosis. Aorta: The aortic root is normal in size and structure. Venous: The inferior vena cava is normal in size with greater than 50% respiratory variability, suggesting right atrial pressure of 3 mmHg. IAS/Shunts: The interatrial septum appears to be lipomatous. No atrial level shunt detected by color flow Doppler. LEFT VENTRICLE PLAX 2D LVIDd:         4.40 cm     Diastology LVIDs:         3.00 cm     LV e' medial:    5.98 cm/s LV PW:         1.10 cm     LV E/e' medial:  12.0 LV IVS:        1.00 cm     LV e' lateral:   7.29 cm/s  LV E/e' lateral: 9.8  LV Volumes (MOD) LV vol  d, MOD A2C: 28.2 ml LV vol d, MOD A4C: 85.0 ml LV vol s, MOD A2C: 14.9 ml LV vol s, MOD A4C: 44.0 ml LV SV MOD A2C:     13.3 ml LV SV MOD A4C:     85.0 ml LV SV MOD BP:      25.2 ml RIGHT VENTRICLE RV Basal diam:  3.80 cm RV Mid diam:    3.80 cm RV FAC:         -54.7 % LEFT ATRIUM         Index LA diam:    3.30 cm 2.00 cm/m  AORTIC VALVE AV Vmax:           119.00 cm/s AV Vmean:          75.800 cm/s AV VTI:            0.167 m AV Peak Grad:      5.7 mmHg AV Mean Grad:      3.0 mmHg LVOT Vmax:         71.80 cm/s LVOT Vmean:        46.900 cm/s LVOT VTI:          0.123 m LVOT/AV VTI ratio: 0.74 MITRAL VALVE               TRICUSPID VALVE MV Area (PHT): 4.86 cm    TR Peak grad:   22.1 mmHg MV Decel Time: 156 msec    TR Vmax:        235.00 cm/s MV E velocity: 71.60 cm/s MV A velocity: 78.00 cm/s  SHUNTS MV E/A ratio:  0.92        Systemic VTI: 0.12 m Jenkins Rouge MD Electronically signed by Jenkins Rouge MD Signature Date/Time: 08/20/2022/4:37:27 PM    Final    DG Abd Portable 1V  Result Date: 08/20/2022 CLINICAL DATA:  Encounter for feeding tube placement. EXAM: PORTABLE ABDOMEN - 1 VIEW COMPARISON:  None Available. FINDINGS: Tip of the weighted enteric tube is below the diaphragm in the midline in the region of the mid distal stomach. Nonobstructed upper abdominal bowel gas pattern. Scoliosis and lumbar degenerative change. IMPRESSION: Tip of the weighted enteric tube in the mid distal stomach. Electronically Signed   By: Keith Rake M.D.   On: 08/20/2022 11:39   [place] EKG SITE RITE  Result Date: 08/20/2022 If [facility] image not attached, placement could not be confirmed due to current cardiac rhythm.  MR Lumbar Spine W Wo Contrast  Result Date: 08/20/2022 CLINICAL DATA:  Recent spinal surgery.  Possible postop infection. EXAM: MRI LUMBAR SPINE WITHOUT AND WITH CONTRAST TECHNIQUE: Multiplanar and multiecho pulse sequences of the lumbar spine were obtained without and with intravenous contrast. CONTRAST:  12m  GADAVIST GADOBUTROL 1 MMOL/ML IV SOLN COMPARISON:  06/25/2022 FINDINGS: Segmentation:  Standard. Alignment: Lumbar levoscoliosis with apex at L2. grade 1 anterolisthesis at L5-S1 Vertebrae: L2-5 right-sided posterior fusion. Bone marrow edema at L3 has decreased. No new site of bone marrow signal abnormality or enhancement. No acute fracture. Conus medullaris and cauda equina: Conus extends to the L1 level. Conus and cauda equina appear normal. Paraspinal and other soft tissues: Dorsal postoperative changes. Disc levels: L1-2: No spinal canal or neural foraminal stenosis. L2-3: Right transpedicular screws. Widely patent spinal canal. No neural impingement. L3-4: Right transpedicular screws. No spinal canal or neural foraminal stenosis. L4-5: Right L4 transpedicular screw. Widely patent spinal canal. No foraminal  stenosis. L5-S1: No spinal canal or neural foraminal stenosis. Visualized sacrum: Normal. IMPRESSION: 1. L2-5 right-sided posterior fusion with widely patent spinal canal and neural foramina. 2. Decreased bone marrow edema at L3. No new site of bone marrow signal abnormality or enhancement. Electronically Signed   By: Ulyses Jarred M.D.   On: 08/20/2022 03:50   MR BRAIN W WO CONTRAST  Result Date: 08/20/2022 CLINICAL DATA:  Delirium EXAM: MRI HEAD WITHOUT AND WITH CONTRAST TECHNIQUE: Multiplanar, multiecho pulse sequences of the brain and surrounding structures were obtained without and with intravenous contrast. CONTRAST:  32m GADAVIST GADOBUTROL 1 MMOL/ML IV SOLN COMPARISON:  Head CT 08/19/2022 Brain MRI 02/01/2017 FINDINGS: Brain: There are punctate foci of abnormal diffusion restriction within the right frontal lobe and in the occipital horn of the left lateral ventricle. The former is likely a punctate focus of ischemia but the latter may be proteinaceous or purulent debris. No chronic microhemorrhage or siderosis. There is multifocal hyperintense T2-weighted signal within the white matter.  Generalized volume loss. The midline structures are normal. There is no abnormal contrast enhancement. Vascular: Normal flow voids. Skull and upper cervical spine: Normal marrow signal. Sinuses/Orbits: Right mastoid fluid. Bilateral ocular lens replacements. Paranasal sinuses are clear. Other: None IMPRESSION: 1. Punctate focus of acute ischemia in the right frontal lobe. 2. Diffusion abnormality within the occipital horn of the left lateral ventricle may indicate proteinaceous/purulent debris. Blood considered less likely given the absence of SWI abnormality and lack of hemorrhage elsewhere. 3. No hemorrhage or mass effect. Electronically Signed   By: KUlyses JarredM.D.   On: 08/20/2022 03:44   DG CHEST PORT 1 VIEW  Result Date: 08/19/2022 CLINICAL DATA:  Check endotracheal tube EXAM: PORTABLE CHEST 1 VIEW COMPARISON:  08/18/2022 FINDINGS: Endotracheal tube is noted 1.8 cm above the carina. Gastric catheter extends into the stomach. Cardiac shadow is stable. Mild central vascular congestion is noted without edema. Mild atelectatic changes are seen in the bases. No acute bony abnormality is noted. IMPRESSION: Tubes and lines in satisfactory position. Increased central vascular congestion with basilar atelectasis. Electronically Signed   By: MInez CatalinaM.D.   On: 08/19/2022 18:17   EEG adult  Result Date: 08/19/2022 YLora Havens MD     08/19/2022  5:47 PM Patient Name: Marissa ISIDOROMRN: [id number] Attending: PLora HavensReferring Physician/Provider: BCristal Generous NP Date: 08/19/2022 Duration: 24.34 mins Patient history: 89yoF with ams. EEG to evaluate for seizure Level of alertness: Awake AEDs during EEG study: LTG, Propofol Technical aspects: This EEG study was done with scalp electrodes positioned according to the 10-20 International system of electrode placement. Electrical activity was reviewed with band pass filter of 1-70Hz$ , sensitivity of 7 uV/mm, display speed of 367msec  with a 60Hz$  notched filter applied as appropriate. EEG data were recorded continuously and digitally stored.  Video monitoring was available and reviewed as appropriate. Description: EEG showed continuous generalized 3 to 6 Hz theta-delta slowing.  Hyperventilation and photic stimulation were not performed.   ABNORMALITY - Continuous slow, generalized IMPRESSION: This study is suggestive of moderate diffuse encephalopathy, nonspecific etiology. No seizures or epileptiform discharges were seen throughout the recording. PrLora [name] CT LUMBAR SPINE W CONTRAST  Result Date: 08/19/2022 CLINICAL DATA:  Low back pain, infection suspected EXAM: CT LUMBAR SPINE WITH CONTRAST TECHNIQUE: Multidetector CT imaging of the lumbar spine was performed with intravenous contrast administration. RADIATION DOSE REDUCTION: This exam was performed according to the departmental dose-optimization  program which includes automated exposure control, adjustment of the mA and/or kV according to patient size and/or use of iterative reconstruction technique. CONTRAST:  36m OMNIPAQUE IOHEXOL 350 MG/ML SOLN COMPARISON:  08/06/2022 CT lumbar spine FINDINGS: Segmentation: 5 lumbar type vertebrae. Alignment: Unchanged scoliosis, centered at L1. Redemonstrated fused grade 1 anterolisthesis of L5 on S1. Vertebrae: Osteopenia. Posterior fixation L2-L5. Redemonstrated subacute fracture through the L3 vertebral body. No additional acute fracture. No evidence of osteomyelitis. Interval removal of the bilateral L1 screws and left L2-L5 screws. Tracts from prior S1 screws. There remains solid osseous fusion of L1-S1. Postsurgical changes are again noted in both iliac wings consistent with bone graft donor sites. Paraspinal and other soft tissues: Increased air and fluid in the posterior soft tissues, not unexpected in the setting of recent surgery. Vascular calcifications. Diverticulosis without diverticulitis. Disc levels: Unchanged appearance of  the lumbar disc spaces, osseous spinal canal, and neural foramina, without significant stenosis. IMPRESSION: 1. Interval removal of the bilateral L1 screws and left L2-L5 screws. 2. Redemonstrated subacute fracture through the L3 vertebral body. No additional acute fracture. No evidence of osteomyelitis. 3. Increased air and fluid in the posterior soft tissues, not unexpected in the setting of recent surgery. Electronically Signed   By: AMerilyn BabaM.D.   On: 08/19/2022 17:06   CT Angio Chest Pulmonary Embolism (PE) W or WO Contrast  Result Date: 08/19/2022 CLINICAL DATA:  High probability for PE. Low back pain. Infection suspected. EXAM: CT ANGIOGRAPHY CHEST WITH CONTRAST TECHNIQUE: Multidetector CT imaging of the chest was performed using the standard protocol during bolus administration of intravenous contrast. Multiplanar CT image reconstructions and MIPs were obtained to evaluate the vascular anatomy. RADIATION DOSE REDUCTION: This exam was performed according to the departmental dose-optimization program which includes automated exposure control, adjustment of the mA and/or kV according to patient size and/or use of iterative reconstruction technique. CONTRAST:  747mOMNIPAQUE IOHEXOL 350 MG/ML SOLN COMPARISON:  Chest x-ray 08/18/2022 FINDINGS: Cardiovascular: Heart is mildly enlarged. Aorta is normal in size. There are atherosclerotic calcifications of the aorta. There is adequate opacification of the pulmonary arteries to the segmental level. There is no evidence for pulmonary embolism. Mediastinum/Nodes: There is an enlarged right hilar lymph node measuring 12 mm short axis. There are additional nonenlarged hilar and mediastinal lymph nodes. The esophagus and visualized thyroid gland are within normal limits. Lungs/Pleura: There are small bilateral pleural effusions, right greater than left. Multifocal patchy airspace and ground-glass opacities are seen throughout the right middle lobe, right upper  lobe and minimally in the left upper lobe. There is also small amount of patchy airspace disease in the bilateral lower lobes. There is central peribronchial wall thickening bilaterally. No pneumothorax. Upper Abdomen: No acute abnormality. Musculoskeletal: Left shoulder arthroplasty is present. There are mildly displaced posterolateral right eighth and tenth rib fractures which appear acute or subacute. There is a nondisplaced posterolateral right ninth rib fracture. There is dextroconvex scoliosis of the thoracolumbar spine. Review of the MIP images confirms the above findings. IMPRESSION: 1. No evidence for pulmonary embolism. 2. Small bilateral pleural effusions, right greater than left. 3. Multifocal patchy airspace and ground-glass opacities throughout the right upper lobe, right middle lobe and bilateral lower lobes worrisome for multifocal pneumonia. 4. Central peribronchial wall thickening compatible with bronchitis. 5. Right hilar lymphadenopathy, likely reactive. 6. Acute or subacute right ninth through tenth rib fractures. Aortic Atherosclerosis (ICD10-I70.0). Electronically Signed   By: AmRonney Asters.D.   On: 08/19/2022 16:29  CT HEAD WO CONTRAST (5MM)  Result Date: 08/19/2022 CLINICAL DATA:  Delirium. EXAM: CT HEAD WITHOUT CONTRAST TECHNIQUE: Contiguous axial images were obtained from the base of the skull through the vertex without intravenous contrast. RADIATION DOSE REDUCTION: This exam was performed according to the departmental dose-optimization program which includes automated exposure control, adjustment of the mA and/or kV according to patient size and/or use of iterative reconstruction technique. COMPARISON:  Head CT 02/27/2018. FINDINGS: Brain: No acute hemorrhage. Unchanged chronic small-vessel disease. Cortical gray-white differentiation is otherwise preserved. Prominence of the ventricles and sulci within normal limits for age. No extra-axial collection. Basilar cisterns are patent.  Vascular: No hyperdense vessel or unexpected calcification. Skull: No calvarial fracture or suspicious bone lesion. Skull base is unremarkable. Sinuses/Orbits: Paranasal sinuses, mastoid air cells, and middle ear cavities are well aerated. Orbits are unremarkable. Other: None. IMPRESSION: 1. No acute intracranial abnormality. 2. Unchanged chronic small-vessel disease. Electronically Signed   By: Emmit Alexanders M.D.   On: 08/19/2022 16:23   DG CHEST PORT 1 VIEW  Result Date: 08/18/2022 CLINICAL DATA:  Congestive heart failure EXAM: PORTABLE CHEST 1 VIEW COMPARISON:  Radiograph 08/18/2022 FINDINGS: Unchanged cardiomediastinal silhouette. Elevated left hemidiaphragm with left basilar subsegmental atelectasis, similar prior exam. No new airspace disease. No large effusion or evidence of pneumothorax. Skin fold overlies the right upper chest. Prior left shoulder arthroplasty. Scoliosis and spondylosis. Partially visualized lumbar spine fusion rod. IMPRESSION: Unchanged elevated left hemidiaphragm with left basilar atelectasis. No new airspace disease. Electronically Signed   By: Maurine Simmering M.D.   On: 08/18/2022 08:03   DG Chest 1 View  Result Date: 08/17/2022 CLINICAL DATA:  Fevers EXAM: PORTABLE CHEST 1 VIEW COMPARISON:  08/14/2022 FINDINGS: Cardiac shadow is stable. Postsurgical changes in the left shoulder are seen. Lungs are hypoinflated but clear. No new focal abnormality is seen. Improved aeration is noted in the bases bilaterally. IMPRESSION: No focal infiltrate noted. Electronically Signed   By: [name] M.D.   On: 08/17/2022 18:01   DG CHEST PORT 1 VIEW  Result Date: 08/14/2022 CLINICAL DATA:  Cough EXAM: PORTABLE CHEST 1 VIEW COMPARISON:  August 12, 2022 FINDINGS: Scoliotic curvature of the thoracic spine. Left retrocardiac opacity obscures the left hemidiaphragm. This finding is stable. The left hilum is normal. The right hilum is not well assessed as a projects over the thoracic spine. Mild  opacity in the medial right base is favored to represent atelectasis or vascular crowding. No pneumothorax. No other interval changes or acute abnormalities. IMPRESSION: 1. The right hilum is not well assessed as it projects over the thoracic spine. Recommend a PA and lateral chest x-ray when the patient is able. 2. Left retrocardiac opacity is stable. This finding could represent atelectasis or infiltrate. 3. No other acute abnormalities. Electronically Signed   By: Dorise Bullion III M.D.   On: 08/14/2022 15:54   VAS [place] LOWER EXTREMITY VENOUS (DVT)  Result Date: 08/12/2022  Lower Venous DVT Study Patient Name:  [name] [place]  Date of Exam:   08/12/2022 Medical Rec #: [id number]            Accession #:    [id number] Date of Birth: July 19, 1956            Patient Gender: F Patient Age:   35 years Exam Location:  [facility] Procedure:      VAS [place] LOWER EXTREMITY VENOUS (DVT) Referring Phys: Emelda Brothers --------------------------------------------------------------------------------  Indications: Immobility s/p two lumbar surgeries, edema.  Comparison Study:  No prior studies. Performing Technologist: Darlin Coco RDMS, RVT  Examination Guidelines: A complete evaluation includes B-mode imaging, spectral Doppler, color Doppler, and power Doppler as needed of all accessible portions of each vessel. Bilateral testing is considered an integral part of a complete examination. Limited examinations for reoccurring indications may be performed as noted. The reflux portion of the exam is performed with the patient in reverse Trendelenburg.  +---------+---------------+---------+-----------+----------+--------------+ RIGHT    CompressibilityPhasicitySpontaneityPropertiesThrombus Aging +---------+---------------+---------+-----------+----------+--------------+ CFV      Full           Yes      Yes                                  +---------+---------------+---------+-----------+----------+--------------+ SFJ      Full                                                        +---------+---------------+---------+-----------+----------+--------------+ FV Prox  Full                                                        +---------+---------------+---------+-----------+----------+--------------+ FV Mid   Full                                                        +---------+---------------+---------+-----------+----------+--------------+ FV DistalFull                                                        +---------+---------------+---------+-----------+----------+--------------+ PFV      Full                                                        +---------+---------------+---------+-----------+----------+--------------+ POP      Full           Yes      Yes                                 +---------+---------------+---------+-----------+----------+--------------+ PTV      Full                                                        +---------+---------------+---------+-----------+----------+--------------+ PERO     Full                                                        +---------+---------------+---------+-----------+----------+--------------+   +---------+---------------+---------+-----------+----------+--------------+  LEFT     CompressibilityPhasicitySpontaneityPropertiesThrombus Aging +---------+---------------+---------+-----------+----------+--------------+ CFV      Full           Yes      Yes                                 +---------+---------------+---------+-----------+----------+--------------+ SFJ      Full                                                        +---------+---------------+---------+-----------+----------+--------------+ FV Prox  Full                                                         +---------+---------------+---------+-----------+----------+--------------+ FV Mid   Full                                                        +---------+---------------+---------+-----------+----------+--------------+ FV DistalFull                                                        +---------+---------------+---------+-----------+----------+--------------+ PFV      Full                                                        +---------+---------------+---------+-----------+----------+--------------+ POP      Full           Yes      Yes                                 +---------+---------------+---------+-----------+----------+--------------+ PTV      Full                                                        +---------+---------------+---------+-----------+----------+--------------+ PERO     Full                                                        +---------+---------------+---------+-----------+----------+--------------+     Summary: RIGHT: - There is no evidence of deep vein thrombosis in the lower extremity.  - No cystic structure found in the popliteal fossa.  LEFT: - There is no evidence of deep vein thrombosis in the lower extremity.  - A  3.8 x 1.0 cm heterogenous, hypoechoic collection is found in the popliteal fossa.  *See table(s) above for measurements and observations. Electronically signed by Monica Martinez MD on 08/12/2022 at 2:28:32 PM.    Final    DG Chest 1 View  Result Date: 08/12/2022 CLINICAL DATA:  Cough.  Recent back surgery. EXAM: CHEST  1 VIEW COMPARISON:  02/27/2018. FINDINGS: Low lung volumes accentuate the pulmonary vasculature and cardiomediastinal silhouette. Stable elevation of the left hemidiaphragm. Slightly increased retrocardiac opacity, most likely representing atelectasis. No pleural effusion or pneumothorax. Stable cardiac and mediastinal contours. Left shoulder hemiarthroplasty. Partially visualized surgical drain along  the thoracic and upper lumbar spine. IMPRESSION: Stable elevation of the left hemidiaphragm with slightly increased retrocardiac opacity, most likely representing atelectasis. Electronically Signed   By: Emmit Alexanders M.D.   On: 08/12/2022 14:01    Labs:  Basic Metabolic Panel: Recent Labs  Lab 09/02/22 1030 09/03/22 0405 09/04/22 0145 09/06/22 0418 09/07/22 0208 09/08/22 0431 09/09/22 0512  NA 130* 130* 129* 132* 131* 128* 131*  K 3.4* 3.2* 4.0 3.1* 2.9* 3.7 3.6  CL 93* 94* 95* 96* 99 98 99  CO2 28 26 26 24 22 24 22  $ GLUCOSE 109* 137* 96 109* 94 117* 124*  BUN 7* 7* 5* 5* 5* 6* 8  CREATININE 0.34* <0.30* <0.30* <0.30* 0.33* <0.30* 0.38*  CALCIUM 8.3* 7.9* 8.3* 8.7* 8.4* 8.5* 8.9  MG 1.7 1.7 1.7  --   --   --   --     CBC: Recent Labs  Lab 09/03/22 0405 09/04/22 0145 09/06/22 0418  WBC 7.8 7.3 7.1  NEUTROABS  --  4.7  --   HGB 8.4* 8.6* 9.1*  HCT 25.3* 25.8* 28.6*  MCV 100.4* 101.2* 102.1*  PLT 403* 359 398    CBG: Recent Labs  Lab 09/08/22 0625 09/08/22 1145 09/08/22 1642 09/08/22 2056 09/09/22 0514  GLUCAP 130* 118* 117* 159* 126*    Brief HPI:   [name] is a 67 y.o. female ***   Hospital Course: [name] was admitted to rehab 09/03/2022 for inpatient therapies to consist of PT, ST and OT at least three hours five days a week. Past admission physiatrist, therapy team and rehab RN have worked together to provide customized collaborative inpatient rehab.   Blood pressures were monitored on TID basis and   Diabetes has been monitored with ac/hs CBG checks and SSI was use prn for tighter BS control.    Rehab course: During patient's stay in rehab weekly team conferences were held to monitor patient's progress, set goals and discuss barriers to discharge. At admission, patient required  She  has had improvement in activity tolerance, balance, postural control as well as ability to compensate for deficits.      Disposition:    Diet:  Special Instructions:   Allergies as of 09/09/2022   No Known Allergies   Med Rec must be completed prior to using this [place]***        Signed: Bary Leriche 09/09/2022, 9:15 AM

## 2022-09-09 NOTE — Progress Notes (Signed)
PROGRESS NOTE   Subjective/Complaints:  Patient had some issues with fecal management system, has been adjusted by nursing and appears to be operating.  She reported some shortness of breath overnight when trach was capped.  Son reports she would stop breathing for several seconds at a time as well.   ROS: +diarrhea-improved, +back pain, +insomnia/poor sleep-decreased last night due to trach capped +LE swelling. Denies fevers, chills, cough, CP, SOB, abd pain, N/V/C, new/worsening paresthesias/weakness, or any other complaints at this time.    Objective:   No results found. No results for input(s): "WBC", "HGB", "HCT", "PLT" in the last 72 hours.  Recent Labs    09/08/22 0431 09/09/22 0512  NA 128* 131*  K 3.7 3.6  CL 98 99  CO2 24 22  GLUCOSE 117* 124*  BUN 6* 8  CREATININE <0.30* 0.38*  CALCIUM 8.5* 8.9     Intake/Output Summary (Last 24 hours) at 09/09/2022 1225 Last data filed at 09/09/2022 0900 Gross per 24 hour  Intake 120 ml  Output 1470 ml  Net -1350 ml         Physical Exam: Vital Signs Blood pressure (!) 147/90, pulse 97, temperature 98.1 F (36.7 C), resp. rate 16, height 5' 1"$  (1.549 m), weight 65.2 kg, SpO2 98 %.  Constitutional:  thin, , NAD, eating breakfast HENT: [place]/AT, Cortrak has been removed., MMM.   Eyes: Conjugate gaze, conjunctiva clear, PERRL Neck:     Comments: #6 trach in place. Pt able to speak around trach fairly well. Excellent phonation with trach capped Cardiovascular: RRR, soft murmur heard (VSD), 1+ BLE swelling-stable Pulmonary: CTAB no w/r/r, no increased WOB Abdominal: Soft, nontender, nondistended, positive bowel sounds/normal active Genitourinary:    Comments: Rectal tube is in place, stool noted in the tube  foley in place-yellow urine   PRIOR EXAM: Musculoskeletal:        General: Swelling present.     Cervical back: Normal range of motion.     Comments: Right  leg discrepancy with weakness and leg rotated outward. Dependent edema Right forearm and hand and left forearm.   Skin:    Comments: Well healed old right thigh incision. Wound VAC in place, low back  Neurological:     Mental Status: She is alert and oriented to person, place, and time.     Comments: Alert and oriented x 3 (needed some help with day) much improved insight and awareness. [place] Memory. Normal language and speech. Cranial nerve exam unremarkable. UE 4/5 prox to distal. RLE 2-/5 prox to 4/5 distally. LLE 2+/5 prox to 4/5 distally. Senses pain in all 4 limbs. No abnl resting tone.   Psychiatric:        Mood and Affect: Mood normal.        Behavior: Behavior normal.   Assessment/Plan: 1. Functional deficits which require 3+ hours per day of interdisciplinary therapy in a comprehensive inpatient rehab setting. Physiatrist is providing close team supervision and 24 hour management of active medical problems listed below. Physiatrist and rehab team continue to assess barriers to discharge/monitor patient progress toward functional and medical goals  Care Tool:  Bathing  Bathing activity did not occur: Refused  Bathing assist       Upper Body Dressing/Undressing Upper body dressing   What is the patient wearing?: Hospital gown only    Upper body assist Assist Level: Total Assistance - Patient < 25%    Lower Body Dressing/Undressing Lower body dressing      What is the patient wearing?: Hospital gown only     Lower body assist Assist for lower body dressing: Total Assistance - Patient < 25%     Toileting Toileting Toileting Activity did not occur Landscape architect and hygiene only): N/A (no void or bm) (using catheter and rectal tube)  Toileting assist Assist for toileting: Dependent - Patient 0%     Transfers Chair/bed transfer  Transfers assist  Chair/bed transfer activity did not occur: Safety/medical concerns         Locomotion Ambulation   Ambulation assist   Ambulation activity did not occur: Safety/medical concerns          Walk 10 feet activity   Assist  Walk 10 feet activity did not occur: Safety/medical concerns        Walk 50 feet activity   Assist Walk 50 feet with 2 turns activity did not occur: Safety/medical concerns         Walk 150 feet activity   Assist Walk 150 feet activity did not occur: Safety/medical concerns         Walk 10 feet on uneven surface  activity   Assist Walk 10 feet on uneven surfaces activity did not occur: Safety/medical concerns         Wheelchair     Assist Is the patient using a wheelchair?: Yes Type of Wheelchair: Manual Wheelchair activity did not occur: Safety/medical concerns         Wheelchair 50 feet with 2 turns activity    Assist    Wheelchair 50 feet with 2 turns activity did not occur: Safety/medical concerns       Wheelchair 150 feet activity     Assist  Wheelchair 150 feet activity did not occur: Safety/medical concerns       Blood pressure (!) 147/90, pulse 97, temperature 98.1 F (36.7 C), resp. rate 16, height 5' 1"$  (1.549 m), weight 65.2 kg, SpO2 98 %.  Medical Problem List and Plan: 1. Functional deficits secondary to osteomyelitis,infection of lumbar surgical site complicated by meningitis/ventriculitis.             -pt also with significant deconditioning             -patient may not yet shower             -ELOS/Goals: 20-27 days, Min/Mod A with PT, OT, SLP  -Continue CIR, PT OT SLP    2.  Antithrombotics: -DVT/anticoagulation:  Pharmaceutical: Lovenox 33m QD             -antiplatelet therapy: N/a 3. Pain Management: [place] 547m/hr patch for pain control w/ Flexeril 71m67mID, Tylenol PRN, Oxycodone 7.71mg66mh PRN  -09/04/22 increased flexeril to 7.71mg 2m, monitor for effect -09/05/22 still having some pain, will schedule Oxycodone 71mg q43mand leave 2.71mg q443mRN dosing--  she's been getting at least 171mg da11mwith the PRNs, but doesn't feel adequate control because she's having to ask for it and feels overwhelmed by all the meds/lines/tubes/etc. Hopefully scheduling will help-- monitor for improvement.   4. Mood/Behavior/Sleep: LCSW to follow for evaluation and support.             --Trazodone 100mg at19m  bedtime for  insomnia/mood  -09/04/22 placed sign on door to minimize interruptions 11p-5a; changed CBG checks as below to minimize interruptions             -antipsychotic agents:  Luvox 173m QHS and Lamictal 2047mQD  -Continue Aricept 1038mD 5. Neuropsych/cognition: This patient is not fully capable of making decisions on her own behalf. 6. Skin/Wound Care: WOC to follow for wound VAC changes --MASD managed by fecal system-- flexiseal clogged 09/04/22 AM but nursing states it's now working; monitor--might be able to d/c soon? -2/21 WOC stopped VAC, start aquacel and dressing 7. Fluids/Electrolytes/Nutrition: Monitor I/O. Continue tube feeds-->likely contributing to diarrhea             -pt had unrecorded intake today             -both she and daughters would like to transition from NGT             -will cut TF in half tonight             -monitor intake closely, continue vitamins             -09/04/22 CMP fairly stable, monitor weekly labs starting 09/06/22 -Cortrak clogged this morning 09/04/22, RD made recs for unclogging, nursing to attempt to continue unclogging overnight-- if unsuccessful, nursing will let me know, and pt will need cortrak team consult on Monday; since it's still not functional, will give gentle IVFs overnight, recheck Cortrak status in AM; push PO intake today -09/05/22 PO intake picking up with outside food, Cortrak still clogged-- will unhook but leave in place for now, weekday team to decide on necessity, pt hopeful for removal. IF Cortrak needed, place consult for Cortrak team to assess unclogging it. Doubt need for IVFs tonight, encourage PO  fluids (has multiple beverages at bedside).   2/19 discontinue core track 8.Sepsis w/ osteomyelitis/meningitis/encephalitis: Continue Cefepime 2g q8h, Vanc 1250m86m2h, and Flagyl 500mg22m for at least 6 weeks             --end date per ID. Will follow up on plan during admit  9. H/o PVT:  Monitor BP/heart rate TID--on coreg 12.5mg B48m Labetaolol 10mg q89mRN  -09/04/22 BP/HR fairly well controlled, cont regimen, monitor  -2/19 consider increase BP medications tomorrow if continues to be elevated     09/09/2022   11:41 AM 09/09/2022    8:40 AM 09/09/2022    5:16 AM  Vitals with BMI  Weight   143 lbs 12 oz  BMI   27.17  99991111lic   147  DiQ000111Qolic   90  Pulse 97 88 91      10. Urinary retention: Has failed 4 voiding trails. Has had foley since 12/23             -maintain foley cath, foley mgt per protocol -- Continue Flomax 0.4mg qPM20mill d/c urecholine.  11. VDRF s/p trach: Tolerating PMSV well -I ordered downsizing to CFS # 4 today--tolerated well -continue ATC w/ pulmonary hygiene -begin day time plugging of trach tomorrow. Decannulate soon.  -Trial of capping with respiratory-  can trial 24 hour capping today  -2/22 Leave trach uncapped tonight 12. T2DM: Monitor BS ac/hs and use SSI for elevated BS.              --continue tube feeds with supplements between meals due to poor intake             --continue liberalized diet for now. -  09/04/22 CBGs looking great, Changed SSI and CBG checks to TID AC + QHS to minimize interruptions overnight, since tube feeds are decreased/temporarily stopped currently; if tube feeds continued longer term, consider changing back to q4h if desired -2/22 well controlled CBG (last 3)  Recent Labs    09/08/22 2056 09/09/22 0514 09/09/22 1113  GLUCAP 159* 126* 121*     13. Hypokalemia: Recurrent likely due to diarrhea/intake.              --will add standing dose BID -->recheck labs on 02/17 and 02/19  -09/04/22 K+ 4.0, continue Klor 76mq BID,  monitor on weekly labs -09/05/22 doesn't like Klor packet but no great alternative (Phos-Nak would have to be heavily dosed to get equivalent K+, so only other alternative is IV-- will hold off for now) -2/19 Increase K+ supplement to 432m BID, could consider change to tablet however this is a large tablet and she had trouble swallowing it previously so continue packet for now -2/20, discussed with pharmacy, change to tablet potassium, IV K+ ordered , daily labs -2/22 stalbe at K+ 3.6 14. Anemia of critical illness: Hgb 7.5-8.9 range -09/04/22 Hgb stable at 8.6, continue to monitor on weekly labs starting 09/06/22 15. Hyponatremia/hypochloremia: Improving 125/87-->130/94.  -09/04/22 Na 129, Cl 95, both stable, continue salt tabs 1g BID -Cortisol testing done during hospitalization (13.8 on 08/30/22), was normal, but daughter and pt want to make sure this is followed up--defer to weekday team regarding ongoing ?Addison's testing  2/21 129, a little decreased, fluid restriction started  2/22 improved to 131 16. Encephalopathy/Delirium: Continues to have intermittent confusion--but much improved compared to 3 days ago per family.  --Continue delirium precautions. 17. Loose stools likely d/t TF and abx.             -reducing TF as above.  -add probiotics and fiber-- fiber changed to Nutrisource per RD on 09/04/22 since this should cause less clogging of Cortrak -Lomotil 1-2 tabs QID PRN -09/05/22 Cortrak still clogged, taking PO better, and diarrhea seems to be slowing-- reassess needs depending on Cortrak removal timing -2/21 improved, rectal tube to help keep wound clean  2/22 continue rectal tube for now 18. Hypothyroidism: continue synthroid 884mQD 19. Chronic combined CHF, BLE Swelling: recent 2D echo done 08/20/2022 showed apical hypokinesis, LVEF 40-45%, RV function was reduced. Intermittently diuresed during hospitalization, most recently given 20m70m lasix on 09/03/22 -09/04/22 noted to still  have BLE swelling, start 20mg20mlasix and monitor lytes/BP/swelling 2/22 weights stable  Filed Weights   09/07/22 0500 09/08/22 0500 09/09/22 0516  Weight: 65 kg 65 kg 65.2 kg      LOS: 6 days A FACE TO FACE EVALUATION WAS PERFORMED  Dwyne Hasegawa Jennye Boroughs/2024, 12:25 PM

## 2022-09-09 NOTE — Plan of Care (Signed)
  Problem: RH Swallowing Goal: LTG Patient will consume least restrictive diet using compensatory strategies with assistance (SLP) Description: LTG:  Patient will consume least restrictive diet using compensatory strategies with assistance (SLP) Outcome: Completed/Met   

## 2022-09-09 NOTE — Progress Notes (Signed)
Patient slept well without fragmentation. Breakthrough pain medication given last night. IV antibiotic therapy continues.Faecal management system in-situ but no drainage noted.. Foley draining well. Family requested dressing change done with AM staff. Made comfortable in bed. Safety maintained at all times.

## 2022-09-09 NOTE — Progress Notes (Addendum)
Speech Language Pathology Daily Session Note  Patient Details  Name: KENDRIANA FORSTON MRN: [id number] Date of Birth: [birth year]  Today's Date: 09/09/2022 SLP Individual Time: 0915-1015 SLP Individual Time Calculation (min): 60 min  Short Term Goals: Week 1: SLP Short Term Goal 1 (Week 1): Pt will tolerate current diet with no overt s/sx of aspiration. SLP Short Term Goal 2 (Week 1): Pt will tolerate speaking valve and recall how to remove speaking valve in case of an emergency independently. SLP Short Term Goal 3 (Week 1): Pt will use external memory aids to recall important information with minA verbal cues. SLP Short Term Goal 4 (Week 1): Pt will recall 2 areas of cognitive impairment with minA verbal cues. SLP Short Term Goal 5 (Week 1): Pt will participate in therapeutic task for 20 minutes with minA verbal cues.  Skilled Therapeutic Interventions: Pt seen this date for skilled ST intervention targeting cognitive and swallowing goals outlined above. Pt received awake/alert and sitting in bed \self-feeding AM meal with independently. Agreeable to intervention at bedside. In good spirits. Son present for session. Trach capped and vitals remaining within acceptable parameters throughout. Pt verbally perseverative at times. Oriented to date independently.  SLP intervention with emphasis on cognitive skill training within functional iADL tasks. Completed TID pill organizer with 100% accuracy given Mod A verbal and visual cues, faded to Min A verbal cues; benefited from [place] A verbal cues to double check for accuracy and correct errors. Decreased attention remains with pt benefiting from intermittent verbal cues for redirection. Recalled 3 medications targeted last session with Min A, as well as therapeutic tasks completed last session - continued to require Sup to Min A for recall of these 3 medications following delays with distractions (Gabapentin, Flomax, and Flexeril). SLP provided skilled  education re: ST POC, compensatory memory and executive functioning strategies, and importance of establishing routine to aid in recall and endurance. Pt verbalized understanding and states she slept better with melatonin (per son pt received, though was not documented in the chart). May benefit from memory notebook to aid in recall; will assess pt's interest in upcoming session(s).  Re: deglutition, oral and pharyngeal phases appear functional with no overt s/sx concerning for oropharyngeal dysphagia at this time. Per pt, she is preferring softer solids, though can consume regular textures without difficulty. Continue to recommend regular diet with thin liquids with trach capped or with PMSV donned. Medications may be provided whole with puree or liquids based on pt's preference. Swallowing goals met.  Pt left in room and in bed with all safety measures activated, call bell within reach, and all immediate needs met. Son remained at bedside. Continue per current ST POC.  Pain No pain reported; NAD  Therapy/Group: Individual Therapy  Brexley Cutshaw A Anjoli Diemer 09/09/2022, 1:26 PM

## 2022-09-09 NOTE — Progress Notes (Signed)
Physical Therapy Session Note  Patient Details  Name: [name] MRN: [id number] Date of Birth: Dec 07, 1955  Today's Date: 09/09/2022 PT Individual Time: [id number] PT Individual Time Calculation (min): 26 min   Short Term Goals: Week 1:  PT Short Term Goal 1 (Week 1): Pt will perform bed mobility with MaxA +1. PT Short Term Goal 2 (Week 1): Pt will maintain unsupported seated balance with overall CGA for >5 min. PT Short Term Goal 3 (Week 1): Pt will perform sit<>stand with MaxA +1. PT Short Term Goal 4 (Week 1): Pt will perform seat-to-seat transfers with MaxA +1 PT Short Term Goal 5 (Week 1): Pt will initiate pre-gait training.  Skilled Therapeutic Interventions/Progress Updates:   Pt received supine in bed and agreeable to PT at bed level.   Supine therex: hip/Knee flexion/extension, hip abduction/adduction, SAQ. Each performed x 10 BLE with AAROM for full ROM and cues to hold at end range as able. Pt reports nil discomfort in L hip, but does not rate, limited to ~80 deg hip flexion.   Gastoc/soleus stretch 2 x 1 min Bil and HS stretch 1 x 1 min bil with overpressure from PT to improve ROM as tolerated. Rest break between sets due to fatigue and for pain management. Pt returned to room and performed ** transfer to bed with **. Pt  left supine in bed with call bell in reach and all needs met.          Therapy Documentation Precautions:  Precautions Precautions: Fall, Back Precaution Comments: trach, cortrack, flexiseal, wound vac on back, using PMSV, rectal tube, urinary catheter Required Braces or Orthoses: Other Brace Other Brace: prevlon boots Restrictions Weight Bearing Restrictions: No Other Position/Activity Restrictions: external rotation R hip at baseline at all times, RLE shorter than LLE, pt ambulated household distances PTA, w/c for community    Vital Signs: Therapy Vitals Temp: 98.1 F (36.7 C) Temp Source: Oral Pulse Rate: 90 Resp: 18 BP: (!)  157/89 Patient Position (if appropriate): Lying Oxygen Therapy SpO2: 100 % O2 Device: Room Air Pain: Pain Assessment Pain Scale: 0-10 Pain Score: 7  Pain Type: Acute pain Pain Location: Generalized Pain Orientation: Lower Pain Descriptors / Indicators: Aching Pain Frequency: Constant Pain Onset: On-going Pain Intervention(s): Medication (See eMAR)      Therapy/Group: Individual Therapy  Lorie Phenix 09/09/2022, 3:50 PM

## 2022-09-10 DIAGNOSIS — I10 Essential (primary) hypertension: Secondary | ICD-10-CM

## 2022-09-10 LAB — GLUCOSE, CAPILLARY
Glucose-Capillary: 116 mg/dL — ABNORMAL HIGH (ref 70–99)
Glucose-Capillary: 134 mg/dL — ABNORMAL HIGH (ref 70–99)
Glucose-Capillary: 93 mg/dL (ref 70–99)
Glucose-Capillary: 93 mg/dL (ref 70–99)

## 2022-09-10 LAB — BASIC METABOLIC PANEL
Anion gap: 8 (ref 5–15)
BUN: 13 mg/dL (ref 8–23)
CO2: 23 mmol/L (ref 22–32)
Calcium: 8.6 mg/dL — ABNORMAL LOW (ref 8.9–10.3)
Chloride: 99 mmol/L (ref 98–111)
Creatinine, Ser: <0.3 mg/dL — ABNORMAL LOW (ref 0.44–1.00)
Glucose, Bld: 118 mg/dL — ABNORMAL HIGH (ref 70–99)
Potassium: 3.4 mmol/L — ABNORMAL LOW (ref 3.5–5.1)
Sodium: 130 mmol/L — ABNORMAL LOW (ref 135–145)

## 2022-09-10 LAB — VANCOMYCIN, TROUGH: Vancomycin Tr: 14 ug/mL — ABNORMAL LOW (ref 15–20)

## 2022-09-10 MED ORDER — VANCOMYCIN HCL 1250 MG/250ML IV SOLN
1250.0000 mg | Freq: Two times a day (BID) | INTRAVENOUS | Status: DC
  Administered 2022-09-10 – 2022-09-17 (×14): 1250 mg via INTRAVENOUS
  Filled 2022-09-10 (×14): qty 250

## 2022-09-10 MED ORDER — POTASSIUM CHLORIDE CRYS ER 20 MEQ PO TBCR
20.0000 meq | EXTENDED_RELEASE_TABLET | Freq: Three times a day (TID) | ORAL | Status: DC
  Administered 2022-09-10 – 2022-09-18 (×24): 20 meq via ORAL
  Filled 2022-09-10 (×24): qty 1

## 2022-09-10 MED ORDER — CARVEDILOL 25 MG PO TABS
25.0000 mg | ORAL_TABLET | Freq: Two times a day (BID) | ORAL | Status: DC
  Administered 2022-09-10 – 2022-10-13 (×66): 25 mg via ORAL
  Filled 2022-09-10 (×66): qty 1

## 2022-09-10 MED ORDER — VANCOMYCIN HCL 1250 MG/250ML IV SOLN: 1250.0000 mg | Freq: Two times a day (BID) | INTRAVENOUS | Status: DC

## 2022-09-10 NOTE — Progress Notes (Signed)
Physical Therapy Session Note  Patient Details  Name: [name] MRN: [id number] Date of Birth: [birth year]  Today's Date: 09/10/2022 PT Individual Time: [id number] PT Individual Time Calculation (min): 70 min   Short Term Goals: Week 1:  PT Short Term Goal 1 (Week 1): Pt will perform bed mobility with MaxA +1. PT Short Term Goal 2 (Week 1): Pt will maintain unsupported seated balance with overall CGA for >5 min. PT Short Term Goal 3 (Week 1): Pt will perform sit<>stand with MaxA +1. PT Short Term Goal 4 (Week 1): Pt will perform seat-to-seat transfers with MaxA +1 PT Short Term Goal 5 (Week 1): Pt will initiate pre-gait training.  Skilled Therapeutic Interventions/Progress Updates: Pt presented in bed with husband present pleasant and agreeable to therapy. Pt c/o pain in back 7/10, premedicated with repositioning and rest break provided as needed. Pt agreeable to work on Sit to stand this session with RW. Pt also indicated "bubbling" coming from rectal tube. Pt required maxA to roll to L with PTA noting leakage from tube. PTA performed peri-care total A to clean fecal matter as well as notified LPN of leakage. LPN re-inflated balloon and PTA continued to finalize adjusting brief. Pt then performed supine to sit with maxA and required total A to scoot to EOB. Pt then participated in x 5 Sit to stand from elevated bed increased rest breaks between bouts and was consistent maxA x 2 to stand. Pt required multimodal cues at hips and chest to improve posture and complete anterior translation of hips. Pt was unable to sustain standing for >10 sec on each bout. Pt required total A for controlled descent when returning to EOB. Pt was able to sustain sitting balance throughout session with supervision and slight L lean. Pt required maxA x 2 to return to supine with pt indicating "bubbling" feeling again. Pt performed roll to L with maxA x 1 and noted rectal tube leaking with peri-care and brief change  completed total A from PTA. Pt was boosted to [facility] total A x 2 with use of check pad. Pt repositioned to comfort and left with bed alarm on, call bell within reach and needs met. Nsg notified at end of session that rectal tube continued to leak.      Therapy Documentation Precautions:  Precautions Precautions: Fall, Back Precaution Comments: trach, rectal tube, foley Required Braces or Orthoses: Other Brace Other Brace: prevlon boots Restrictions Weight Bearing Restrictions: No Other Position/Activity Restrictions: external rotation R hip at baseline at all times, RLE shorter than LLE, pt ambulated household distances PTA, w/c for community General:   Vital Signs: Therapy Vitals Temp: 98 F (36.7 C) Pulse Rate: 92 Resp: 16 BP: (!) 147/82 Patient Position (if appropriate): Lying Oxygen Therapy SpO2: 100 % O2 Device: Room Air Pain:   Mobility:   Locomotion :    Trunk/Postural Assessment :    Balance:   Exercises:   Other Treatments:      Therapy/Group: Individual Therapy  Katrell Milhorn 09/10/2022, 4:19 PM

## 2022-09-10 NOTE — Progress Notes (Signed)
Pharmacy Antibiotic Note   [name] is a 67 y.o. 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   Continues on Vancomycin and Cefepime    Last vancomycin trough therapeutic at 14 mcg/mL.  Renal function remains stable.  Afebrile, WBC WNL.   Plan: Continue vanc '1250mg'$  IV Q12H Continue Cefepime 2g IV Q8H and Flagyl '500mg'$  IV Q8H Monitor renal function, culture results, weekly vanc trough  Thank you for involving pharmacy in this patient's care. Anette Guarneri, PharmD 09/10/2022 2:52 PM

## 2022-09-10 NOTE — Progress Notes (Signed)
Occupational Therapy Weekly Progress Note  Patient Details  Name: [name] MRN: [id number] Date of Birth: 1956-03-14  Beginning of progress report period: September 03, 2022 End of progress report period: September 10, 2022  Today's Date: 09/10/2022 OT Individual Time: 0850-1000 OT Individual Time Calculation (min): 70 min    Patient has met 3 of 5 short term goals.  [name] has made good progress her first week in therapy, progressing significantly with sitting balance. She is able to sit EOB with as little as (S)- CGA. She is still requiring max A +2 to stand in the stedy and has difficulty achieving full stand with poor glute activation and terminal hip extension. She continues to have cognitive deficits that impact her participation at times and awareness of deficits and sequence of hospitalization. She has an extremely supportive family who is constantly present and encouraging.   Patient continues to demonstrate the following deficits: muscle weakness, decreased cardiorespiratoy endurance and decreased oxygen support, decreased initiation, decreased attention, decreased awareness, decreased problem solving, decreased safety awareness, decreased memory, and delayed processing, and decreased sitting balance, decreased standing balance, decreased postural control, and decreased balance strategies and therefore will continue to benefit from skilled OT intervention to enhance overall performance with BADL and Reduce care partner burden.  Patient progressing toward long term goals..  Continue plan of care.  OT Short Term Goals Week 1:  OT Short Term Goal 1 (Week 1): Pt will tolerate sitting EOB for 10 minutes with CGA to engage in UB bathing. OT Short Term Goal 1 - Progress (Week 1): Met OT Short Term Goal 2 (Week 1): Pt will be able to bridge hips in bed with mod A to enable placement of pad or brief as needed. OT Short Term Goal 2 - Progress (Week 1): Progressing toward goal OT Short  Term Goal 3 (Week 1): Pt will be able to use BUE to wash UB with mod A. OT Short Term Goal 3 - Progress (Week 1): Met OT Short Term Goal 4 (Week 1): Pt will be able to push to stand with max A of 2 and push up to full stand for at least 30 seconds. OT Short Term Goal 4 - Progress (Week 1): Progressing toward goal OT Short Term Goal 5 (Week 1): Pt will demonstrate improved attention to focus on 1 ADL task with min cues. OT Short Term Goal 5 - Progress (Week 1): Met Week 2:  OT Short Term Goal 1 (Week 2): Pt will complete sit > stand from EOB in stedy with mod +2 A OT Short Term Goal 2 (Week 2): Pt will maintain dynamic sitting balance with (S) for entire ADL routine EOB OT Short Term Goal 3 (Week 2): Pt will bridge in bed with mod A for reducing caregiver burden  Skilled Therapeutic Interventions/Progress Updates:    Pt received supine with c/o pain in her back, un-rated but requesting pain medication and LPN alerted- who entered session halfway through to administer. Trach capped and all VSS throughout session. Daughter present during session. She was reluctantly agreeable to session and argumentative with OT throughout entire session. She came to EOB with max A, management for both LE and trunk. Once EOB she was able to sit with close (S) for several minutes. She requested to not use the stedy to transfer and trialed RW use with +2 support instead. She had good initiation from EOB but poor terminal hip extension, requiring heavy max A +2. Max A+2 for stand pivot to  the w/c. Increased time required throughout session for line management- pt on PICC, rectal tube, and foley. Pt agreeable to wash hair in the TIS for hopeful improved mood and self efficacy. Max A provided. She stood at the sink for brief change- max +2 for sit > stand and maintaining standing balance with BUE support on the sink. Total A +2 to don brief. Pt completed 2x8 forward and overhead reach with a 5 lb dowel to increase UE  strengthening needed for ADLs. Stand pivot back to bed with max A +2. Max +2 A to return to supine in bed and to scoot pt up. Pt left supine with all needs met, bed alarm set.   Therapy Documentation Precautions:  Precautions Precautions: Fall, Back Precaution Comments: trach, cortrack, flexiseal, wound vac on back, using PMSV, rectal tube, urinary catheter Required Braces or Orthoses: Other Brace Other Brace: prevlon boots Restrictions Weight Bearing Restrictions: No Other Position/Activity Restrictions: external rotation R hip at baseline at all times, RLE shorter than LLE, pt ambulated household distances PTA, w/c for community  Therapy/Group: Individual Therapy  [name] 09/10/2022, 6:13 AM

## 2022-09-10 NOTE — Progress Notes (Signed)
Patient uncapped per order, on room air tolerating well.

## 2022-09-10 NOTE — Progress Notes (Signed)
Speech Language Pathology Weekly Progress and Session Note  Patient Details  Name: [name] MRN: [id number] Date of Birth: [birth year]  Beginning of progress report period: September 04, 2022 End of progress report period: September 11, 2022  Today's Date: 09/10/2022 SLP Individual Time: [id number] SLP Individual Time Calculation (min): 45 min  Short Term Goals: Week 1: SLP Short Term Goal 1 (Week 1): Pt will tolerate current diet with no overt s/sx of aspiration. SLP Short Term Goal 1 - Progress (Week 1): Met SLP Short Term Goal 2 (Week 1): Pt will tolerate speaking valve and recall how to remove speaking valve in case of an emergency independently. SLP Short Term Goal 2 - Progress (Week 1): Discontinued (comment) (Trach capped; no longer using PMSV) SLP Short Term Goal 3 (Week 1): Pt will use external memory aids to recall important information with minA verbal cues. SLP Short Term Goal 3 - Progress (Week 1): Not met SLP Short Term Goal 4 (Week 1): Pt will recall 2 areas of cognitive impairment with minA verbal cues. SLP Short Term Goal 4 - Progress (Week 1): Not met SLP Short Term Goal 5 (Week 1): Pt will participate in therapeutic task for 20 minutes with minA verbal cues. SLP Short Term Goal 5 - Progress (Week 1): Not met    New Short Term Goals: Week 2: SLP Short Term Goal 1 (Week 2): Pt will use external memory aids to recall important information with min-to-mod A verbal cues. SLP Short Term Goal 2 (Week 2): Pt will recall 2 areas of cognitive impairment with min-to-modA verbal cues. SLP Short Term Goal 3 (Week 2): Pt will participate in therapeutic task for 20 minutes with min-to-modA verbal cues. SLP Short Term Goal 4 (Week 2): Patient will complete functional and semi-complex problem solving tasks with min-to-mod A verbal cues  Weekly Progress Updates: Patient has made overall slow gains and has met 1 of 5 STGs this reporting period. PMSV goal was discontinued due to  current capping trials. Swallowing goals met and pt is tolerating a regular consistency diet and thin liquids with minimal overt s/sx of aspiration. Patient is currently completing cognitive tasks with overall mod A verbal/visual cues in regards to sustained attention, functional recall, problem solving, and intellectual awareness. Progress and receptiveness toward SLP interventions appear limited by significantly reduced insight into deficits. Pt often excuses difficulty with tasks on being being sleepy or just waking up from naps with limited insight and/or acceptance into cognitive deficits. Patient and family education is ongoing. Family is very supportive and encouraging. Patient would benefit from continued skilled SLP intervention to maximize cognitive functioning and overall functional independence prior to discharge.   Intensity: Minumum of 1-2 x/day, 30 to 90 minutes Frequency: 3 to 5 out of 7 days Duration/Length of Stay: TBD 20-27 days estimated Treatment/Interventions: Cognitive remediation/compensation;Internal/external aids;Cueing hierarchy;Patient/family education;Therapeutic Activities;Functional tasks;Therapeutic Exercise  Daily Session Skilled Therapeutic Interventions: Skilled ST treatment focused on cognitive goals. Pt was sleeping in bed on arrival and accompanied by daughter. Pt roused to min verbal stimuli and agreeable to SLP intervention. SLP facilitated sustained attention, information processing, functional recall, and problem solving skills with a medication management task in which pt identified BID pillbox organization errors. Pt completed task with mod A verbal redirection cues for sustained attention for 10-15 minute intervals, mod A verbal cues for recall of task instructions, mod A verbal cues for working memory within the task, mod A verbal cues to identify errors, min A verbal cues to repair  errors. Pt exhibited poor frustration tolerance to feedback which worsened as  session progressed. Pt eventually became very frustrated and stated she was "done" with speech therapy because she continues to be woken up during her naps. SLP attempted to provide verbal support/encouragement, as well clinical reasoning to support SLP POC. Pt appeared uninterested in education and suspect partially attributed to decreased insight into cognitive deficits. Spoke with dtr after session and she reported pt's poor frustration tolerance and "quick fuse" are very abnormal for patient and a significant change s/p events attributed to hospitalization. Patient was left in bed with alarm activated and immediate needs within reach at end of session. Continue per current plan of care.      General    Pain  None/denied  Therapy/Group: Individual Therapy  [name] 09/10/2022, 3:05 PM

## 2022-09-10 NOTE — Progress Notes (Addendum)
PROGRESS NOTE   Subjective/Complaints:  Reports she slept better with trach not capped.  Reports she is tired from her recent therapy session.    ROS: +diarrhea-improved, +back pain, +insomnia/improved +LE swelling. Denies fevers, chills, cough, CP, SOB, abd pain, N/V/C, new/worsening paresthesias/weakness, or any other complaints at this time.    Objective:   No results found. No results for input(s): "WBC", "HGB", "HCT", "PLT" in the last 72 hours.  Recent Labs    09/09/22 0512 09/10/22 0336  NA 131* 130*  K 3.6 3.4*  CL 99 99  CO2 22 23  GLUCOSE 124* 118*  BUN 8 13  CREATININE 0.38* <0.30*  CALCIUM 8.9 8.6*     Intake/Output Summary (Last 24 hours) at 09/10/2022 [id number] Last data filed at 09/09/2022 2043 Gross per 24 hour  Intake 240 ml  Output 1200 ml  Net -960 ml         Physical Exam: Vital Signs Blood pressure (!) 150/89, pulse 90, temperature 98.3 F (36.8 C), resp. rate 17, height '5\' 1"'$  (1.549 m), weight 65.2 kg, SpO2 99 %.  Constitutional:  thin, , NAD, working with therapy HENT: [place]/AT, Cortrak has been removed., MMM.   Eyes: Conjugate gaze, conjunctiva clear, PERRL Neck:     Comments: #6 trach in place. Pt able to speak around trach fairly well. Excellent phonation with trach capped Cardiovascular: RRR, soft murmur heard (VSD), 1+ BLE swelling-stable Pulmonary: CTAB no w/r/r, no increased WOB Abdominal: Soft, nontender, nondistended, positive bowel sounds/normal active Genitourinary:    Comments: Rectal tube is in place, stool noted in the tube  foley in place-yellow urine   PRIOR EXAM: Musculoskeletal:        General: Swelling present.     Cervical back: Normal range of motion.     Comments: Right leg discrepancy with weakness and leg rotated outward. Dependent edema Right forearm and hand and left forearm.   Skin:    Comments: Well healed old right thigh incision. Wound VAC- has been  removed midline back dressing dry and clean, low back  Neurological:     Mental Status: She is alert and oriented to person, place, and time.     Comments: Alert and oriented x 3 (needed some help with day) much improved insight and awareness. [place] Memory. Normal language and speech. Cranial nerve exam unremarkable. UE 4/5 prox to distal. RLE 2-/5 prox to 4/5 distally. LLE 2+/5 prox to 4/5 distally. Senses pain in all 4 limbs. No abnl resting tone.   Psychiatric:        Mood and Affect: Mood normal.        Behavior: Behavior normal.   Assessment/Plan: 1. Functional deficits which require 3+ hours per day of interdisciplinary therapy in a comprehensive inpatient rehab setting. Physiatrist is providing close team supervision and 24 hour management of active medical problems listed below. Physiatrist and rehab team continue to assess barriers to discharge/monitor patient progress toward functional and medical goals  Care Tool:  Bathing  Bathing activity did not occur: Refused           Bathing assist       Upper Body Dressing/Undressing Upper body dressing  What is the patient wearing?: Hospital gown only    Upper body assist Assist Level: Total Assistance - Patient < 25%    Lower Body Dressing/Undressing Lower body dressing      What is the patient wearing?: Hospital gown only     Lower body assist Assist for lower body dressing: Total Assistance - Patient < 25%     Toileting Toileting Toileting Activity did not occur Landscape architect and hygiene only): N/A (no void or bm) (using catheter and rectal tube)  Toileting assist Assist for toileting: Dependent - Patient 0%     Transfers Chair/bed transfer  Transfers assist  Chair/bed transfer activity did not occur: Safety/medical concerns        Locomotion Ambulation   Ambulation assist   Ambulation activity did not occur: Safety/medical concerns          Walk 10 feet activity   Assist  Walk 10  feet activity did not occur: Safety/medical concerns        Walk 50 feet activity   Assist Walk 50 feet with 2 turns activity did not occur: Safety/medical concerns         Walk 150 feet activity   Assist Walk 150 feet activity did not occur: Safety/medical concerns         Walk 10 feet on uneven surface  activity   Assist Walk 10 feet on uneven surfaces activity did not occur: Safety/medical concerns         Wheelchair     Assist Is the patient using a wheelchair?: Yes Type of Wheelchair: Manual Wheelchair activity did not occur: Safety/medical concerns         Wheelchair 50 feet with 2 turns activity    Assist    Wheelchair 50 feet with 2 turns activity did not occur: Safety/medical concerns       Wheelchair 150 feet activity     Assist  Wheelchair 150 feet activity did not occur: Safety/medical concerns       Blood pressure (!) 150/89, pulse 90, temperature 98.3 F (36.8 C), resp. rate 17, height '5\' 1"'$  (1.549 m), weight 65.2 kg, SpO2 99 %.  Medical Problem List and Plan: 1. Functional deficits secondary to osteomyelitis,infection of lumbar surgical site complicated by meningitis/ventriculitis.             -pt also with significant deconditioning             -patient may not yet shower             -ELOS/Goals: 20-27 days, Min/Mod A with PT, OT, SLP  -Continue CIR, PT OT SL   2.  Antithrombotics: -DVT/anticoagulation:  Pharmaceutical: Lovenox '40mg'$  QD             -antiplatelet therapy: N/a 3. Pain Management: Butrans 10mg/hr patch for pain control w/ Flexeril '5mg'$  TID, Tylenol PRN, Oxycodone 7.'5mg'$  q4h PRN  -09/04/22 increased flexeril to 7.'5mg'$  TID, monitor for effect -09/05/22 still having some pain, will schedule Oxycodone '5mg'$  q6h and leave 2.'5mg'$  q4h PRN dosing-- she's been getting at least '15mg'$  daily with the PRNs, but doesn't feel adequate control because she's having to ask for it and feels overwhelmed by all the  meds/lines/tubes/etc. Hopefully scheduling will help-- monitor for improvement.   4. Mood/Behavior/Sleep: LCSW to follow for evaluation and support.             --Trazodone '100mg'$  at bedtime for  insomnia/mood  -09/04/22 placed sign on door to minimize interruptions 11p-5a; changed CBG checks  as below to minimize interruptions             -antipsychotic agents:  Luvox '100mg'$  QHS and Lamictal '200mg'$  QD  -Continue Aricept '10mg'$  QD 5. Neuropsych/cognition: This patient is not fully capable of making decisions on her own behalf. 6. Skin/Wound Care: WOC to follow for wound VAC changes --MASD managed by fecal system-- flexiseal clogged 09/04/22 AM but nursing states it's now working; monitor--might be able to d/c soon? -2/21 WOC stopped VAC, start aquacel and dressing 7. Fluids/Electrolytes/Nutrition: Monitor I/O. Continue tube feeds-->likely contributing to diarrhea             -pt had unrecorded intake today             -both she and daughters would like to transition from NGT             -will cut TF in half tonight             -monitor intake closely, continue vitamins             -09/04/22 CMP fairly stable, monitor weekly labs starting 09/06/22 -Cortrak clogged this morning 09/04/22, RD made recs for unclogging, nursing to attempt to continue unclogging overnight-- if unsuccessful, nursing will let me know, and pt will need cortrak team consult on Monday; since it's still not functional, will give gentle IVFs overnight, recheck Cortrak status in AM; push PO intake today -09/05/22 PO intake picking up with outside food, Cortrak still clogged-- will unhook but leave in place for now, weekday team to decide on necessity, pt hopeful for removal. IF Cortrak needed, place consult for Cortrak team to assess unclogging it. Doubt need for IVFs tonight, encourage PO fluids (has multiple beverages at bedside).   2/19 discontinue core track 8.Sepsis w/ osteomyelitis/meningitis/encephalitis: Continue Cefepime 2g q8h,  Vanc '1250mg'$  q12h, and Flagyl '500mg'$  q8h for at least 6 weeks             --end date per ID. Will follow up on plan during admit   -pharmacy following for Vanc dosing- appreciate- appears checking today 9. H/o PVT:  Monitor BP/heart rate TID--on coreg 12.'5mg'$  BID, Labetaolol '10mg'$  q2h PRN  -09/04/22 BP/HR fairly well controlled, cont regimen, monitor  -2/23  BP has been a little above goal, increase coreg dose to '25mg'$  BID     09/10/2022    6:18 AM 09/10/2022    3:42 AM 09/09/2022   11:56 PM  Vitals with BMI  Systolic [id number]    Diastolic 89    Pulse 90 86 87      10. Urinary retention: Has failed 4 voiding trails. Has had foley since 12/23             -maintain foley cath, foley mgt per protocol -- Continue Flomax 0.'4mg'$  qPM. Will d/c urecholine.  11. VDRF s/p trach: Tolerating PMSV well -I ordered downsizing to CFS # 4 today--tolerated well -continue ATC w/ pulmonary hygiene -begin day time plugging of trach tomorrow. Decannulate soon.  -Trial of capping with respiratory-  can trial 24 hour capping today  -2/22 Leave trach uncapped tonight 12. T2DM: Monitor BS ac/hs and use SSI for elevated BS.              --continue tube feeds with supplements between meals due to poor intake             --continue liberalized diet for now. -09/04/22 CBGs looking great, Changed SSI and CBG checks to TID AC + QHS to minimize  interruptions overnight, since tube feeds are decreased/temporarily stopped currently; if tube feeds continued longer term, consider changing back to q4h if desired -2/23 controlled, continue current medications CBG (last 3)  Recent Labs    09/09/22 1622 09/09/22 2105 09/10/22 0615  GLUCAP 90 140* 116*     13. Hypokalemia: Recurrent likely due to diarrhea/intake.              --will add standing dose BID -->recheck labs on 02/17 and 02/19  -09/04/22 K+ 4.0, continue Klor 70mq BID, monitor on weekly labs -09/05/22 doesn't like Klor packet but no great alternative (Phos-Nak would  have to be heavily dosed to get equivalent K+, so only other alternative is IV-- will hold off for now) -2/19 Increase K+ supplement to 465m BID, could consider change to tablet however this is a large tablet and she had trouble swallowing it previously so continue packet for now -2/20, discussed with pharmacy, change to tablet potassium, IV K+ ordered , daily labs -2/23 K+ down to 3.4, increase K+ supplement 2084mfrom BID to TID 14. Anemia of critical illness: Hgb 7.5-8.9 range -09/04/22 Hgb stable at 8.6, continue to monitor on weekly labs starting 09/06/22 15. Hyponatremia/hypochloremia: Improving 125/87-->130/94.  -09/04/22 Na 129, Cl 95, both stable, continue salt tabs 1g BID -Cortisol testing done during hospitalization (13.8 on 08/30/22), was normal, but daughter and pt want to make sure this is followed up--defer to weekday team regarding ongoing ?Addison's testing  2/21 129, a little decreased, fluid restriction started  2/23 stable at 130 16. Encephalopathy/Delirium: Continues to have intermittent confusion--but much improved compared to 3 days ago per family.  --Continue delirium precautions. 17. Loose stools likely d/t TF and abx.             -reducing TF as above.  -add probiotics and fiber-- fiber changed to Nutrisource per RD on 09/04/22 since this should cause less clogging of Cortrak -Lomotil 1-2 tabs QID PRN -09/05/22 Cortrak still clogged, taking PO better, and diarrhea seems to be slowing-- reassess needs depending on Cortrak removal timing -2/21 improved, rectal tube to help keep wound clean  2/22 continue rectal tube for now 18. Hypothyroidism: continue synthroid 27m37mD 19. Chronic combined CHF, BLE Swelling: recent 2D echo done 08/20/2022 showed apical hypokinesis, LVEF 40-45%, RV function was reduced. Intermittently diuresed during hospitalization, most recently given '40mg'$  IV lasix on 09/03/22 -09/04/22 noted to still have BLE swelling, start '20mg'$  PO lasix and monitor  lytes/BP/swelling 2/23 stable, continue to monitor  Filed Weights   09/07/22 0500 09/08/22 0500 09/09/22 0516  Weight: 65 kg 65 kg 65.2 kg      LOS: 7 days A FACE TO FACE EVALUATION WAS PERFORMED  YuriJennye Boroughs3/2024, 8:23 AM

## 2022-09-11 LAB — BASIC METABOLIC PANEL
Anion gap: 11 (ref 5–15)
BUN: 6 mg/dL — ABNORMAL LOW (ref 8–23)
CO2: 20 mmol/L — ABNORMAL LOW (ref 22–32)
Calcium: 8.8 mg/dL — ABNORMAL LOW (ref 8.9–10.3)
Chloride: 101 mmol/L (ref 98–111)
Creatinine, Ser: 0.3 mg/dL — ABNORMAL LOW (ref 0.44–1.00)
GFR, Estimated: >60 mL/min (ref >60)
Glucose, Bld: 91 mg/dL (ref 70–99)
Potassium: 3.6 mmol/L (ref 3.5–5.1)
Sodium: 132 mmol/L — ABNORMAL LOW (ref 135–145)

## 2022-09-11 LAB — GLUCOSE, CAPILLARY
Glucose-Capillary: 105 mg/dL — ABNORMAL HIGH (ref 70–99)
Glucose-Capillary: 172 mg/dL — ABNORMAL HIGH (ref 70–99)
Glucose-Capillary: 107 mg/dL — ABNORMAL HIGH (ref 70–99)
Glucose-Capillary: 112 mg/dL — ABNORMAL HIGH (ref 70–99)

## 2022-09-11 NOTE — Progress Notes (Signed)
Patient would like trach to remain capped overnight. Patient currently tolerating well, vitals are stable. RT will continue to monitor patient.

## 2022-09-11 NOTE — Progress Notes (Signed)
PROGRESS NOTE   Subjective/Complaints:  Doing pretty well this week. Slept ok, pain is tolerable, diarrhea is slowing down and more formed, rectal tube became dislodged today-- will leave out for today and try using bedpan (pt states she has control of her bowels, just needs assistance with pan). If BMs still too liquidy or get anywhere near the wound, will reinsert the rectal tube.  Foley still in place.  Eating much better! Otherwise no complaints.    ROS: +diarrhea-improved, +back pain-tolerable, +insomnia-improved, +LE swelling. Denies fevers, chills, cough, CP, SOB, abd pain, N/V/C, new/worsening paresthesias/weakness, or any other complaints at this time.    Objective:   No results found. No results for input(s): "WBC", "HGB", "HCT", "PLT" in the last 72 hours.  Recent Labs    09/10/22 0336 09/11/22 0505  NA 130* 132*  K 3.4* 3.6  CL 99 101  CO2 23 20*  GLUCOSE 118* 91  BUN 13 6*  CREATININE <0.30* 0.30*  CALCIUM 8.6* 8.8*    Intake/Output Summary (Last 24 hours) at 09/11/2022 1424 Last data filed at 09/11/2022 0830 Gross per 24 hour  Intake 2612.88 ml  Output 2250 ml  Net 362.88 ml        Physical Exam: Vital Signs Blood pressure (!) 144/98, pulse 82, temperature 98.3 F (36.8 C), resp. rate 16, height '5\' 1"'$  (1.549 m), weight 67.8 kg, SpO2 98 %.  Constitutional:  thin, NAD, laying in bed HENT: [place]/AT, Cortrak has been removed, MMM.   Eyes: Conjugate gaze, conjunctiva clear, PERRL Neck:     Comments: #6 trach in place. Pt able to speak around trach fairly well. Excellent phonation with trach capped Cardiovascular: RRR, soft murmur heard (VSD), 1-2+ RLE and 2+ LLE swelling-stable from last week Pulmonary: CTAB no w/r/r, no increased WOB Abdominal: Soft, nontender, nondistended, positive bowel sounds/normal active Genitourinary:    Comments: Rectal tube removed this morning  foley in place-yellow  urine   PRIOR EXAM: Musculoskeletal:        General: Swelling present.     Cervical back: Normal range of motion.     Comments: Right leg discrepancy with weakness and leg rotated outward. Dependent edema Right forearm and hand and left forearm.   Skin:    Comments: Well healed old right thigh incision. Wound VAC- has been removed midline back dressing dry and clean, low back  Neurological:     Mental Status: She is alert and oriented to person, place, and time.     Comments: Alert and oriented x 3 (needed some help with day) much improved insight and awareness. [place] Memory. Normal language and speech. Cranial nerve exam unremarkable. UE 4/5 prox to distal. RLE 2-/5 prox to 4/5 distally. LLE 2+/5 prox to 4/5 distally. Senses pain in all 4 limbs. No abnl resting tone.   Psychiatric:        Mood and Affect: Mood normal.        Behavior: Behavior normal.   Assessment/Plan: 1. Functional deficits which require 3+ hours per day of interdisciplinary therapy in a comprehensive inpatient rehab setting. Physiatrist is providing close team supervision and 24 hour management of active medical problems listed below. Physiatrist and rehab  team continue to assess barriers to discharge/monitor patient progress toward functional and medical goals  Care Tool:  Bathing  Bathing activity did not occur: Refused           Bathing assist       Upper Body Dressing/Undressing Upper body dressing   What is the patient wearing?: Hospital gown only    Upper body assist Assist Level: Total Assistance - Patient < 25%    Lower Body Dressing/Undressing Lower body dressing      What is the patient wearing?: Hospital gown only     Lower body assist Assist for lower body dressing: Total Assistance - Patient < 25%     Toileting Toileting Toileting Activity did not occur Landscape architect and hygiene only): N/A (no void or bm) (using catheter and rectal tube)  Toileting assist Assist for  toileting: Dependent - Patient 0%     Transfers Chair/bed transfer  Transfers assist  Chair/bed transfer activity did not occur: Safety/medical concerns        Locomotion Ambulation   Ambulation assist   Ambulation activity did not occur: Safety/medical concerns          Walk 10 feet activity   Assist  Walk 10 feet activity did not occur: Safety/medical concerns        Walk 50 feet activity   Assist Walk 50 feet with 2 turns activity did not occur: Safety/medical concerns         Walk 150 feet activity   Assist Walk 150 feet activity did not occur: Safety/medical concerns         Walk 10 feet on uneven surface  activity   Assist Walk 10 feet on uneven surfaces activity did not occur: Safety/medical concerns         Wheelchair     Assist Is the patient using a wheelchair?: Yes Type of Wheelchair: Manual Wheelchair activity did not occur: Safety/medical concerns         Wheelchair 50 feet with 2 turns activity    Assist    Wheelchair 50 feet with 2 turns activity did not occur: Safety/medical concerns       Wheelchair 150 feet activity     Assist  Wheelchair 150 feet activity did not occur: Safety/medical concerns       Blood pressure (!) 144/98, pulse 82, temperature 98.3 F (36.8 C), resp. rate 16, height '5\' 1"'$  (1.549 m), weight 67.8 kg, SpO2 98 %.  Medical Problem List and Plan: 1. Functional deficits secondary to osteomyelitis,infection of lumbar surgical site complicated by meningitis/ventriculitis.             -pt also with significant deconditioning             -patient may not yet shower             -ELOS/Goals: 20-27 days, Min/Mod A with PT, OT, SLP  -Continue CIR, PT OT SL   2.  Antithrombotics: -DVT/anticoagulation:  Pharmaceutical: Lovenox '40mg'$  QD             -antiplatelet therapy: N/a 3. Pain Management: Butrans 10mg/hr patch for pain control w/ Flexeril '5mg'$  TID, Tylenol PRN, Oxycodone 7.'5mg'$  q4h  PRN, Gabapentin '100mg'$  TID  -09/04/22 increased flexeril to 7.'5mg'$  TID -09/05/22 schedule Oxycodone '5mg'$  q6h and leave 2.'5mg'$  q4h PRN dosing 4. Mood/Behavior/Sleep: LCSW to follow for evaluation and support.             --Trazodone '100mg'$  QHS and melatonin '5mg'$  QHS PRN for  insomnia/mood  -  09/04/22 placed sign on door to minimize interruptions 11p-5a; changed CBG checks as below to minimize interruptions             -antipsychotic agents:  Luvox '100mg'$  QHS and Lamictal '200mg'$  QD  -Continue Aricept '10mg'$  QD 5. Neuropsych/cognition: This patient is not fully capable of making decisions on her own behalf. 6. Skin/Wound Care: WOC to follow for wound VAC changes --MASD managed by fecal system-- flexiseal clogged 09/04/22 AM but nursing states it's now working; monitor--might be able to d/c soon? -2/21 WOC stopped VAC, start aquacel and dressing -09/11/22 flexiseal clogged and dislodged, stool more formed, will try without flexiseal, if stool too loose or getting close to wound then will need replacement.  7. Fluids/Electrolytes/Nutrition: Monitor I/O. Continue tube feeds-->likely contributing to diarrhea             -monitor intake closely, continue vitamins             -09/04/22 CMP fairly stable -2/19 discontinue core track -09/11/22 NOTE: BMP DAILY ORDERED FOR 2/21-3/1 BUT WEEKLY ORDERED QMONDAY STARTING 2/19-- unclear why duplicated 8.Sepsis w/ osteomyelitis/meningitis/encephalitis: Continue Cefepime 2g q8h, Vanc '1250mg'$  q12h, and Flagyl '500mg'$  q8h for at least 6 weeks             --end date per ID. Will follow up on plan during admit  -pharmacy following for Vanc dosing- appreciate- appears checking today 9. H/o PVT:  Monitor BP/heart rate TID--on coreg 12.'5mg'$  BID, Labetaolol '10mg'$  q2h PRN  -09/04/22 BP/HR fairly well controlled, cont regimen, monitor  -2/23  BP has been a little above goal, increase coreg dose to '25mg'$  BID  -09/11/22 BP/HR stable, monitor    09/11/2022    1:24 PM 09/11/2022    9:00 AM 09/11/2022     6:34 AM  Vitals with BMI  Weight   149 lbs 8 oz  BMI   [id number]  Systolic 123456  123456  Diastolic 98  85  Pulse 82 73 72      10. Urinary retention: Has failed 4 voiding trails. Has had foley since 12/23             -maintain foley cath, foley mgt per protocol -- Continue Flomax 0.'4mg'$  qPM. Will d/c urecholine.  11. VDRF s/p trach: Tolerating PMSV well -I ordered downsizing to CFS # 4 today--tolerated well -continue ATC w/ pulmonary hygiene -begin day time plugging of trach tomorrow. Decannulate soon. -Trial of capping with respiratory-  can trial 24 hour capping today -2/22 Leave trach uncapped tonight 12. T2DM: Monitor BS ac/hs and use SSI for elevated BS.              --continue tube feeds with supplements between meals due to poor intake             --continue liberalized diet for now. -09/04/22 CBGs looking great, Changed SSI and CBG checks to TID AC + QHS to minimize interruptions overnight, since tube feeds are decreased/temporarily stopped currently -09/11/22 controlled, continue current medications CBG (last 3)  Recent Labs    09/10/22 2225 09/11/22 0631 09/11/22 1135  GLUCAP 93 105* 172*    13. Hypokalemia: Recurrent likely due to diarrhea/intake.              --will add standing dose BID -->recheck labs on 02/17 and 02/19  -09/04/22 K+ 4.0, continue Klor 13mq BID, monitor on weekly labs -09/05/22 doesn't like Klor packet but no great alternative (Phos-Nak would have to be heavily dosed to get equivalent K+, so  only other alternative is IV-- will hold off for now) -2/19 Increase K+ supplement to 68mq BID, could consider change to tablet however this is a large tablet and she had trouble swallowing it previously so continue packet for now -2/20, discussed with pharmacy, change to tablet potassium, IV K+ ordered , daily labs -2/23 K+ down to 3.4, increase K+ supplement 251m from BID to TID -09/11/22 K+ 3.6, cont regimen 14. Anemia of critical illness: Hgb 7.5-8.9  range -09/04/22 Hgb stable at 8.6, continue to monitor on weekly labs next 09/13/22 15. Hyponatremia/hypochloremia: Improving 125/87-->130/94.  -09/04/22 Na 129, Cl 95, both stable, continue salt tabs 1g BID -Cortisol testing done during hospitalization (13.8 on 08/30/22), was normal, but daughter and pt want to make sure this is followed up--defer to weekday team regarding ongoing ?Addison's testing -2/21 129, a little decreased, fluid restriction started -2/23 stable at 130 16. Encephalopathy/Delirium: Continues to have intermittent confusion--but much improved compared to 3 days ago per family.  --Continue delirium precautions. 17. Loose stools likely d/t TF and abx.             -reducing TF as above.  -add probiotics and fiber-- fiber changed to [employer] per RD on 09/04/22 since this should cause less clogging of Cortrak -Lomotil 1-2 tabs QID PRN -09/05/22 Cortrak still clogged, taking PO better, and diarrhea seems to be slowing-- reassess needs depending on Cortrak removal timing -2/21 improved, rectal tube to help keep wound clean -2/22 continue rectal tube for now -09/11/22 rectal tube dislodged, stools clogging it d/t more formed, d/c for now and see how she does with bedpan, if soiling near wound then needs replacement of flexiseal 18. Hypothyroidism: continue synthroid 8865mQD 19. Chronic combined CHF, BLE Swelling: recent 2D echo done 08/20/2022 showed apical hypokinesis, LVEF 40-45%, RV function was reduced. Intermittently diuresed during hospitalization, most recently given '40mg'$  IV lasix on 09/03/22 -09/04/22 noted to still have BLE swelling, start '20mg'$  PO lasix and monitor lytes/BP/swelling -2/23 stable, continue to monitor -09/11/22 wt up a little, but swelling fairly stable; encouraged TED hose use during day  [facility] Partnershipights   09/08/22 0500 09/09/22 0516 09/11/22 0634  Weight: 65 kg 65.2 kg 67.8 kg      LOS: 8 days A FACE TO FACCallender24/2024, 2:24 PM

## 2022-09-11 NOTE — Progress Notes (Signed)
This nurse notified by bedside LPN that rectal tube had become dislodged patient stool assessed and was type six in rectal tube occluding the rectal tube lumen. MD notified with recommendation to discontinue use of rectal tube

## 2022-09-12 LAB — GLUCOSE, CAPILLARY
Glucose-Capillary: 119 mg/dL — ABNORMAL HIGH (ref 70–99)
Glucose-Capillary: 148 mg/dL — ABNORMAL HIGH (ref 70–99)
Glucose-Capillary: 165 mg/dL — ABNORMAL HIGH (ref 70–99)
Glucose-Capillary: 93 mg/dL (ref 70–99)

## 2022-09-12 LAB — BASIC METABOLIC PANEL
Anion gap: 9 (ref 5–15)
BUN: 6 mg/dL — ABNORMAL LOW (ref 8–23)
CO2: 21 mmol/L — ABNORMAL LOW (ref 22–32)
Calcium: 8.9 mg/dL (ref 8.9–10.3)
Chloride: 99 mmol/L (ref 98–111)
Creatinine, Ser: <0.3 mg/dL — ABNORMAL LOW (ref 0.44–1.00)
Glucose, Bld: 110 mg/dL — ABNORMAL HIGH (ref 70–99)
Potassium: 3.9 mmol/L (ref 3.5–5.1)
Sodium: 129 mmol/L — ABNORMAL LOW (ref 135–145)

## 2022-09-12 MED ORDER — ALTEPLASE 2 MG IJ SOLR
2.0000 mg | Freq: Once | INTRAMUSCULAR | Status: AC
  Administered 2022-09-12: 2 mg

## 2022-09-12 MED ORDER — ALTEPLASE 2 MG IJ SOLR
2.0000 mg | Freq: Once | INTRAMUSCULAR | Status: AC
  Administered 2022-09-12: 2 mg
  Filled 2022-09-12 (×2): qty 2

## 2022-09-12 MED ORDER — CHLORHEXIDINE GLUCONATE CLOTH 2 % EX PADS
6.0000 | MEDICATED_PAD | Freq: Two times a day (BID) | CUTANEOUS | Status: DC
  Administered 2022-09-12 – 2022-09-14 (×4): 6 via TOPICAL

## 2022-09-12 NOTE — Progress Notes (Signed)
PROGRESS NOTE   Subjective/Complaints:  Doing well today. Slept great! Having more formed stools, type 5-6, not super frequent, but mostly in diaper. Willing to attempt bedpan if she is assisted.  Pain well controlled.  Foley in place without issues.  TED hose still not placed.  Denies any other complaints.    ROS: +diarrhea-improved, +back pain-tolerable, +insomnia-improved, +LE swelling-improving. Denies fevers, chills, cough, CP, SOB, abd pain, N/V/C, new/worsening paresthesias/weakness, or any other complaints at this time.    Objective:   No results found. No results for input(s): "WBC", "HGB", "HCT", "PLT" in the last 72 hours.  Recent Labs    09/11/22 0505 09/12/22 0503  NA 132* 129*  K 3.6 3.9  CL 101 99  CO2 20* 21*  GLUCOSE 91 110*  BUN 6* 6*  CREATININE 0.30* <0.30*  CALCIUM 8.8* 8.9     Intake/Output Summary (Last 24 hours) at 09/12/2022 0721 Last data filed at 09/12/2022 [id number] Gross per 24 hour  Intake 831 ml  Output 3450 ml  Net -2619 ml         Physical Exam: Vital Signs Blood pressure (!) 149/86, pulse 86, temperature 98.5 F (36.9 C), temperature source Oral, resp. rate 18, height '5\' 1"'$  (1.549 m), weight 67.4 kg, SpO2 99 %.  Constitutional:  thin, NAD, laying in bed HENT: [place]/AT, Cortrak has been removed, MMM.   Eyes: Conjugate gaze, conjunctiva clear, PERRL Neck:     Comments: #6 trach in place. Pt able to speak around trach fairly well. Excellent phonation with trach capped >24hrs now Cardiovascular: RRR, soft murmur heard (VSD), 1-2+ BLE swelling-slightly improved in LLE from yesterday Pulmonary: CTAB no w/r/r, no increased WOB Abdominal: Soft, nontender, nondistended, positive bowel sounds/normal active Genitourinary:    Comments: Rectal tube removed, sacral wound dressing clean, no stool near the area.   foley in place-yellow urine   PRIOR EXAM: Musculoskeletal:        General:  Swelling present.     Cervical back: Normal range of motion.     Comments: Right leg discrepancy with weakness and leg rotated outward. Dependent edema Right forearm and hand and left forearm.   Skin:    Comments: Well healed old right thigh incision. Wound VAC- has been removed midline back dressing dry and clean, low back  Neurological:     Mental Status: She is alert and oriented to person, place, and time.     Comments: Alert and oriented x 3 (needed some help with day) much improved insight and awareness. [place] Memory. Normal language and speech. Cranial nerve exam unremarkable. UE 4/5 prox to distal. RLE 2-/5 prox to 4/5 distally. LLE 2+/5 prox to 4/5 distally. Senses pain in all 4 limbs. No abnl resting tone.   Psychiatric:        Mood and Affect: Mood normal.        Behavior: Behavior normal.   Assessment/Plan: 1. Functional deficits which require 3+ hours per day of interdisciplinary therapy in a comprehensive inpatient rehab setting. Physiatrist is providing close team supervision and 24 hour management of active medical problems listed below. Physiatrist and rehab team continue to assess barriers to discharge/monitor patient progress toward functional  and medical goals  Care Tool:  Bathing  Bathing activity did not occur: Refused           Bathing assist       Upper Body Dressing/Undressing Upper body dressing   What is the patient wearing?: Hospital gown only    Upper body assist Assist Level: Total Assistance - Patient < 25%    Lower Body Dressing/Undressing Lower body dressing      What is the patient wearing?: Hospital gown only     Lower body assist Assist for lower body dressing: Total Assistance - Patient < 25%     Toileting Toileting Toileting Activity did not occur Landscape architect and hygiene only): N/A (no void or bm) (using catheter and rectal tube)  Toileting assist Assist for toileting: Dependent - Patient 0%     Transfers Chair/bed  transfer  Transfers assist  Chair/bed transfer activity did not occur: Safety/medical concerns        Locomotion Ambulation   Ambulation assist   Ambulation activity did not occur: Safety/medical concerns          Walk 10 feet activity   Assist  Walk 10 feet activity did not occur: Safety/medical concerns        Walk 50 feet activity   Assist Walk 50 feet with 2 turns activity did not occur: Safety/medical concerns         Walk 150 feet activity   Assist Walk 150 feet activity did not occur: Safety/medical concerns         Walk 10 feet on uneven surface  activity   Assist Walk 10 feet on uneven surfaces activity did not occur: Safety/medical concerns         Wheelchair     Assist Is the patient using a wheelchair?: Yes Type of Wheelchair: Manual Wheelchair activity did not occur: Safety/medical concerns         Wheelchair 50 feet with 2 turns activity    Assist    Wheelchair 50 feet with 2 turns activity did not occur: Safety/medical concerns       Wheelchair 150 feet activity     Assist  Wheelchair 150 feet activity did not occur: Safety/medical concerns       Blood pressure (!) 149/86, pulse 86, temperature 98.5 F (36.9 C), temperature source Oral, resp. rate 18, height '5\' 1"'$  (1.549 m), weight 67.4 kg, SpO2 99 %.  Medical Problem List and Plan: 1. Functional deficits secondary to osteomyelitis,infection of lumbar surgical site complicated by meningitis/ventriculitis.             -pt also with significant deconditioning             -patient may not yet shower             -ELOS/Goals: 20-27 days, Min/Mod A with PT, OT, SLP  -Continue CIR, PT OT SL   2.  Antithrombotics: -DVT/anticoagulation:  Pharmaceutical: Lovenox '40mg'$  QD             -antiplatelet therapy: N/a 3. Pain Management: Butrans 65mg/hr patch for pain control w/ Flexeril '5mg'$  TID, Tylenol PRN, Oxycodone 7.'5mg'$  q4h PRN, Gabapentin '100mg'$  TID  -09/04/22  increased flexeril to 7.'5mg'$  TID -09/05/22 schedule Oxycodone '5mg'$  q6h and leave 2.'5mg'$  q4h PRN dosing 4. Mood/Behavior/Sleep: LCSW to follow for evaluation and support.             --Trazodone '100mg'$  QHS and melatonin '5mg'$  QHS PRN for  insomnia/mood  -09/04/22 placed sign on door to minimize  interruptions 11p-5a; changed CBG checks as below to minimize interruptions             -antipsychotic agents:  Luvox '100mg'$  QHS and Lamictal '200mg'$  QD  -Continue Aricept '10mg'$  QD 5. Neuropsych/cognition: This patient is not fully capable of making decisions on her own behalf. 6. Skin/Wound Care: WOC to follow for wound VAC changes --MASD managed by fecal system-- flexiseal clogged 09/04/22 AM but nursing states it's now working; monitor--might be able to d/c soon? -2/21 WOC stopped VAC, start aquacel and dressing -see flexiseal discussion below, #17 7. Fluids/Electrolytes/Nutrition: Monitor I/O. Continue tube feeds-->likely contributing to diarrhea             -monitor intake closely, continue vitamins             -09/04/22 CMP fairly stable -2/19 discontinue core track -09/11/22 NOTE: BMP DAILY ORDERED FOR 2/21-3/1 BUT WEEKLY ORDERED QMONDAY STARTING 2/19-- weekday team to clarify orders 8.Sepsis w/ osteomyelitis/meningitis/encephalitis: Continue Cefepime 2g q8h, Vanc '1250mg'$  q12h, and Flagyl '500mg'$  q8h for at least 6 weeks             --end date per ID. Will follow up on plan during admit  -pharmacy following for Vanc dosing- appreciate- appears checking today 9. H/o PVT:  Monitor BP/heart rate TID--on coreg 12.'5mg'$  BID, Labetaolol '10mg'$  q2h PRN  -09/04/22 BP/HR fairly well controlled, cont regimen, monitor  -2/23  BP has been a little above goal, increase coreg dose to '25mg'$  BID  -09/12/22 BP/HR stable, monitor    09/12/2022    6:29 AM 09/12/2022    6:15 AM 09/12/2022    3:07 AM  Vitals with BMI  Weight 148 lbs 9 oz    BMI 99991111    Systolic  123456   Diastolic  86   Pulse  86 79      10. Urinary retention: Has  failed 4 voiding trails. Has had foley since 12/23             -maintain foley cath, foley mgt per protocol -- Continue Flomax 0.'4mg'$  qPM. Will d/c urecholine.  11. VDRF s/p trach: Tolerating PMSV well -I ordered downsizing to CFS # 4 today--tolerated well -continue ATC w/ pulmonary hygiene -begin day time plugging of trach tomorrow. Decannulate soon. -Trial of capping with respiratory-  can trial 24 hour capping today -2/22 Leave trach uncapped tonight -09/12/22 trach capped overnight-- wondering when this could be removed--weekday team to discuss 12. T2DM: Monitor BS ac/hs and use SSI for elevated BS.              --continue tube feeds with supplements between meals due to poor intake             --continue liberalized diet for now. -09/04/22 CBGs looking great, Changed SSI and CBG checks to TID AC + QHS to minimize interruptions overnight, since tube feeds are decreased/temporarily stopped currently -09/12/22 controlled, continue current medications CBG (last 3)  Recent Labs    09/11/22 1624 09/11/22 2048 09/12/22 0610  GLUCAP 107* 112* 119*     13. Hypokalemia: Recurrent likely due to diarrhea/intake.              --will add standing dose BID -->recheck labs on 02/17 and 02/19  -09/04/22 K+ 4.0, continue Klor 14mq BID, monitor on weekly labs -09/05/22 doesn't like Klor packet but no great alternative (Phos-Nak would have to be heavily dosed to get equivalent K+, so only other alternative is IV-- will hold off for now) -2/19 Increase  K+ supplement to 36mq BID, could consider change to tablet however this is a large tablet and she had trouble swallowing it previously so continue packet for now -2/20, discussed with pharmacy, change to tablet potassium, IV K+ ordered , daily labs -2/23 K+ down to 3.4, increase K+ supplement 243m from BID to TID -09/12/22 K+ 3.9, cont regimen, monitor on labs 14. Anemia of critical illness: Hgb 7.5-8.9 range -09/04/22 Hgb stable at 8.6, continue to  monitor on weekly labs next 09/13/22 15. Hyponatremia/hypochloremia: Improving 125/87-->130/94.  -09/04/22 Na 129, Cl 95, both stable, continue salt tabs 1g BID -Cortisol testing done during hospitalization (13.8 on 08/30/22), was normal, but daughter and pt want to make sure this is followed up--defer to weekday team regarding ongoing ?Addison's testing -2/21 129, a little decreased, fluid restriction started -2/23 stable at 130 -09/12/22 Na 129, stable; Cl now 99, improving. Monitor on next labs 16. Encephalopathy/Delirium: Continues to have intermittent confusion--but much improved compared to 3 days ago per family.  --Continue delirium precautions. 17. Loose stools likely d/t TF and abx.             -reducing TF as above.  -flexiseal clogged 09/04/22 AM but nursing states it's now working; monitor--might be able to d/c soon? -add probiotics and fiber-- fiber changed to [address] on 09/04/22 since this should cause less clogging of Cortrak -Lomotil 1-2 tabs QID PRN -2/21 improved, rectal tube to help keep wound clean -2/22 continue rectal tube for now -09/11/22 rectal tube dislodged, stools clogging it d/t more formed, d/c for now and see how she does with bedpan, if soiling near wound then needs replacement of flexiseal -09/12/22 stools type 5-6, not super frequent, using diaper not bedpan-- looks like no stool is getting close to wound, but advised trying bedpan. Advised nursing that if stool becomes more frequent or more type 6-7, then flexiseal would need to be replaced. Monitor closely.  18. Hypothyroidism: continue synthroid 8840mQD 19. Chronic combined CHF, BLE Swelling: recent 2D echo done 08/20/2022 showed apical hypokinesis, LVEF 40-45%, RV function was reduced. Intermittently diuresed during hospitalization, most recently given '40mg'$  IV lasix on 09/03/22 -09/04/22 noted to still have BLE swelling, start '20mg'$  PO lasix and monitor lytes/BP/swelling -2/23 stable, continue to  monitor -09/11/22 wt up a little, but swelling fairly stable; encouraged TED hose use during day -09/12/22 Wt stable, BLE swelling a little better but mostly stable, asked nursing to please placed TED hose during the day [facility]   09/09/22 0516 09/11/22 0634 09/12/22 0629  Weight: 65.2 kg 67.8 kg 67.4 kg      LOS: 9 days A FACE TO FACDardanelle25/2024, 7:21 AM

## 2022-09-12 NOTE — Progress Notes (Signed)
Occupational Therapy Session Note  Patient Details  Name: [name] MRN: [id number] Date of Birth: 26-Jul-1955  Today's Date: 09/12/2022 OT Individual Time: [id number] OT Individual Time Calculation (min): 60 min    Short Term Goals: Week 1:  OT Short Term Goal 1 (Week 1): Pt will tolerate sitting EOB for 10 minutes with CGA to engage in UB bathing. OT Short Term Goal 1 - Progress (Week 1): Met OT Short Term Goal 2 (Week 1): Pt will be able to bridge hips in bed with mod A to enable placement of pad or brief as needed. OT Short Term Goal 2 - Progress (Week 1): Progressing toward goal OT Short Term Goal 3 (Week 1): Pt will be able to use BUE to wash UB with mod A. OT Short Term Goal 3 - Progress (Week 1): Met OT Short Term Goal 4 (Week 1): Pt will be able to push to stand with max A of 2 and push up to full stand for at least 30 seconds. OT Short Term Goal 4 - Progress (Week 1): Progressing toward goal OT Short Term Goal 5 (Week 1): Pt will demonstrate improved attention to focus on 1 ADL task with min cues. OT Short Term Goal 5 - Progress (Week 1): Met  Skilled Therapeutic Interventions/Progress Updates:    The pt indicated that she rested well last night and was eager to start OT services. The pt was in agreement with getting out of bed to wash up and getting in the w/c to move around in the unit with her son.  The pt was MaxA for cleaning herself following a bowel movement.  The pt was able to come to the EOB with ModAx2, she was s/u assist for washing her face, arms, and chest region.  The pt was Max A with donning the gown and brief.  The pt was then transferred from EOB to w/c LOF using the stedy with MaxA using the bar for coming from sit to stand.  The pt was able to transfer to the w/c with Max with attention to good anatomical positioning for pressure relief.  Nursing came in during OT treatment to flush the pt pic line.  All additional needs were addressed by nursing follow OT  treatment.   Therapy Documentation Precautions:  Precautions Precautions: Fall, Back Precaution Comments: trach, rectal tube, foley Required Braces or Orthoses: Other Brace Other Brace: prevlon boots Restrictions Weight Bearing Restrictions: No Other Position/Activity Restrictions: external rotation R hip at baseline at all times, RLE shorter than LLE, pt ambulated household distances PTA, w/c for community   Therapy/Group: Individual Therapy  Yvonne Kendall 09/12/2022, 4:04 PM

## 2022-09-12 NOTE — Progress Notes (Signed)
Speech Language Pathology Daily Session Note  Patient Details  Name: [name] MRN: [id number] Date of Birth: December 29, 1955  Today's Date: 09/12/2022 SLP Individual Time: [id number] SLP Individual Time Calculation (min): 36 min and Today's Date: 09/12/2022 SLP Missed Time: 9 Minutes Missed Time Reason: Nursing care (PICC line replacement)  Short Term Goals: Week 2: SLP Short Term Goal 1 (Week 2): Pt will use external memory aids to recall important information with min-to-mod A verbal cues. SLP Short Term Goal 2 (Week 2): Pt will recall 2 areas of cognitive impairment with min-to-modA verbal cues. SLP Short Term Goal 3 (Week 2): Pt will participate in therapeutic task for 20 minutes with min-to-modA verbal cues. SLP Short Term Goal 4 (Week 2): Patient will complete functional and semi-complex problem solving tasks with min-to-mod A verbal cues  Skilled Therapeutic Interventions:   Pt seen for skilled SLP session to address cognitive goals regarding compensatory memory strategies, verbal sequencing, and safety awareness. Pt identified ~85% of safety hazards in pictured scene independently. She required min cues to identify all hazards and to discuss solutions for these hazards. Pt required min-mod verbal prompts to complete verbal sequencing task of x2 familiar recipes. Pt often trailed off midway through thoughts or the sequence and needed redirection to complete. Discussed compensatory memory strategies for daily reminders. Pt provided with a white board for pt's family to update with reminders (I.e. family member who will be staying overnight with her) and a memory journal. Therapy session shortened due to arrival of PICC team to replace troubleshoot PICC. Pt missed 9 minutes of SLP session. Pt left in bed with bed alarm activated, husband at bedside, and call bell within reach. Continue SLP PoC.   Pain Pain Assessment Pain Scale: 0-10 Pain Score: 0-No pain   Therapy/Group: Individual  Therapy  Wyn Forster 09/12/2022, 3:56 PM

## 2022-09-13 DIAGNOSIS — D62 Acute posthemorrhagic anemia: Secondary | ICD-10-CM

## 2022-09-13 DIAGNOSIS — F39 Unspecified mood [affective] disorder: Secondary | ICD-10-CM

## 2022-09-13 LAB — CBC
HCT: 30.8 % — ABNORMAL LOW (ref 36.0–46.0)
Hemoglobin: 10 g/dL — ABNORMAL LOW (ref 12.0–15.0)
MCH: 32.1 pg (ref 26.0–34.0)
MCHC: 32.5 g/dL (ref 30.0–36.0)
MCV: 98.7 fL (ref 80.0–100.0)
Platelets: 406 10*3/uL — ABNORMAL HIGH (ref 150–400)
RBC: 3.12 MIL/uL — ABNORMAL LOW (ref 3.87–5.11)
RDW: 18.2 % — ABNORMAL HIGH (ref 11.5–15.5)
WBC: 7 10*3/uL (ref 4.0–10.5)
nRBC: 0 % (ref 0.0–0.2)

## 2022-09-13 LAB — BASIC METABOLIC PANEL
Anion gap: 9 (ref 5–15)
BUN: 6 mg/dL — ABNORMAL LOW (ref 8–23)
CO2: 22 mmol/L (ref 22–32)
Calcium: 8.9 mg/dL (ref 8.9–10.3)
Chloride: 100 mmol/L (ref 98–111)
Creatinine, Ser: 0.31 mg/dL — ABNORMAL LOW (ref 0.44–1.00)
GFR, Estimated: >60 mL/min (ref >60)
Glucose, Bld: 114 mg/dL — ABNORMAL HIGH (ref 70–99)
Potassium: 4.2 mmol/L (ref 3.5–5.1)
Sodium: 131 mmol/L — ABNORMAL LOW (ref 135–145)

## 2022-09-13 LAB — GLUCOSE, CAPILLARY
Glucose-Capillary: 120 mg/dL — ABNORMAL HIGH (ref 70–99)
Glucose-Capillary: 131 mg/dL — ABNORMAL HIGH (ref 70–99)
Glucose-Capillary: 144 mg/dL — ABNORMAL HIGH (ref 70–99)
Glucose-Capillary: 90 mg/dL (ref 70–99)

## 2022-09-13 MED ORDER — ACETAMINOPHEN 325 MG PO TABS
325.0000 mg | ORAL_TABLET | ORAL | Status: DC | PRN
  Administered 2022-09-13: 650 mg via ORAL
  Filled 2022-09-13: qty 2

## 2022-09-13 MED ORDER — SODIUM CHLORIDE 0.9 % IV SOLN
INTRAVENOUS | Status: DC | PRN
  Administered 2022-09-13: 50 mL via INTRAVENOUS
  Administered 2022-09-16: 10 mL/h via INTRAVENOUS

## 2022-09-13 NOTE — Progress Notes (Signed)
Physical Therapy Session Note  Patient Details  Name: [name] MRN: [id number] Date of Birth: [birth year]  Today's Date: 09/13/2022 PT Individual Time: 1102-1207 PT Individual Time Calculation (min): 65 min   Short Term Goals: Week 1:  PT Short Term Goal 1 (Week 1): Pt will perform bed mobility with MaxA +1. PT Short Term Goal 2 (Week 1): Pt will maintain unsupported seated balance with overall CGA for >5 min. PT Short Term Goal 3 (Week 1): Pt will perform sit<>stand with MaxA +1. PT Short Term Goal 4 (Week 1): Pt will perform seat-to-seat transfers with MaxA +1 PT Short Term Goal 5 (Week 1): Pt will initiate pre-gait training.  Skilled Therapeutic Interventions/Progress Updates:  Patient received in bed and agreeable to therapy session. Max assist required with cues for log roll technique to roll Rt with use of bed rail and bed pad to maintain neutral spine position during roll. Assist needed to bring LE's off EOB to initiate raising trunk upright. Max assist required with +2 providing from mod-light guard assist to prevent posterior lean sitting EOB. Pt unable to use bil UE to scoot anteriorly at EOB and Mod assist with bed pad required to scoot and place feet on floor. Pt able to hold seated balance with bil UE support and feet on floor with assist. Pt's son arrived and pt/son requesting use of BSC to attempt voiding bowels. Pt able to complete 2x sit<>stand with RW with Max assist, unable to maintain standing for >10 seconds and unable to weight shift Rt /Lt for pivot to [name]. [name] used for [name] transfer. Pt uncertain of voiding. Upon stand no stool in commode but noted that pt had soiled brief with incontinent BM. Total Assist +2 for repeat sit<>stands from [name] paddles and to perform pericare. RN arrived and applied gerhardt's butt cream to red areas around buttocks. Pt transfer with [name] BSC>WC and dependently rolled to gym. Completed Bil UE strengthening with 4lbs bar.  Attempted to increased to 5lbs bar but pt c/o Rt wrist pain and 4lbs continued. Pt completed 15x bil UE bicep curl. EOS pt resting in WC in upright position as tilted position resulted in too much pressure on back wound. Call bell within reach and pt's son at bedside.   Therapy Documentation Precautions:  Precautions Precautions: Fall, Back Precaution Comments: trach, rectal tube, foley Required Braces or Orthoses: Other Brace Other Brace: prevlon boots Restrictions Weight Bearing Restrictions: No Other Position/Activity Restrictions: external rotation R hip at baseline at all times, RLE shorter than LLE, pt ambulated household distances PTA, w/c for community    Therapy/Group: Individual Therapy   Verner Mould, DPT Acute Rehabilitation Services Office [phone]  09/13/22 8:14 AM

## 2022-09-13 NOTE — Progress Notes (Signed)
Speech Language Pathology Daily Session Note  Patient Details  Name: [name] MRN: [id number] Date of Birth: Jun 26, 1956  Today's Date: 09/13/2022 SLP Individual Time: 1300-1400 SLP Individual Time Calculation (min): 60 min  Short Term Goals: Week 2: SLP Short Term Goal 1 (Week 2): Pt will use external memory aids to recall important information with min-to-mod A verbal cues. SLP Short Term Goal 2 (Week 2): Pt will recall 2 areas of cognitive impairment with min-to-modA verbal cues. SLP Short Term Goal 3 (Week 2): Pt will participate in therapeutic task for 20 minutes with min-to-modA verbal cues. SLP Short Term Goal 4 (Week 2): Patient will complete functional and semi-complex problem solving tasks with min-to-mod A verbal cues  Skilled Therapeutic Interventions: Skilled ST treatment focused on cognitive goals. Pt was greeted upright in bed on arrival and accompanied by daughter. Pt requested a few additional minutes to complete lunch. While pt ate, SLP/Dtr conversed regarding ST POC. Pt thought she had bowel incontinence however upon initiating peri care, it was discovered she had not. SLP applied cream on pt's buttocks per pt's request and changed brief and chuck pad with max A for bed mobility with physical assist from pt's daughter. SLP facilitated discussion on uses and organization techniques for memory notebook provided to patient by SLP during previous session. Pt recorded memorable events from therapy today (sat up, took a few steps), meals, and things she is looking forward to with min A for attention to task. Pt was only able to recall brief details from earlier, therefore discussed writing things down in the moment, and/or immediately following therapy sessions while information is more fresh on her mind. Dtr wrote down reminders for patient including what times different family members plan to visit, as this is a frequently discussed and forgotten topic for pt.   SLP  facilitated a working memory task in which pt recalled 50% of novel information following <30 second delay given mod A verbal cues while sustaining attention to task for ~12 minute duration with min-to-mod A verbal redirection cues. Pt appeared less interested in this task and/or exhibiting cognitive fatigue, thus suspect this impacted participation and accuracy.   Patient was left in bed with alarm activated and immediate needs within reach at end of session. Continue per current plan of care.       Pain Pain Assessment Pain Score: 6   Therapy/Group: Individual Therapy  [name] 09/13/2022, 1:07 PM

## 2022-09-13 NOTE — Progress Notes (Signed)
Occupational Therapy Session Note  Patient Details  Name: [name] MRN: [id number] Date of Birth: [birth year]  Today's Date: 09/13/2022 OT Individual Time: [phone] OT Individual Time Calculation (min): 57 min   Skilled Therapeutic Interventions/Progress Updates: Patient received resting in bed with son present. Patient performing basic grooming tasks at bed level. Offered to work on ADL skills or functional training during treatment session, with patient requesting to work on skills to improve ability to perform [facility] transfer. Patient reporting 7.5 out of 10 pain and wanted to start with bed level exercises while calling for pain medication. Utilized red theraband for UE strength and endurance exercises. Therapist counting each set of 10 to ensure patient didn't  stop prematurely due to distraction vs fatigue. Followed UE strength and endurance exercises with log roll and bed mobility education to improve independence with the first step in transferring from supine in bed to [facility]. Patient needing reassurance that the log roll using LE's and a reach toward the bed rail was safe if she continued to work to keep shoulders and hips in alignment. Min assist with knees bent when rolling to the left side and Mod assist with knees bent when rolling to the right. Continue to work on functional skills and toileting transfers per patient request.     Therapy Documentation Precautions:  Precautions Precautions: Fall, Back Precaution Comments: trach, rectal tube, foley Required Braces or Orthoses: Other Brace Other Brace: prevlon boots Restrictions Weight Bearing Restrictions: No Other Position/Activity Restrictions: external rotation R hip at baseline at all times, RLE shorter than LLE, pt ambulated household distances PTA, w/c for community General:   Vital Signs: Therapy Vitals Pulse Rate: 93 Resp: 18 Patient Position (if appropriate): Lying Oxygen Therapy SpO2: 97 % O2 Device: Room  Air Pain: Pain Assessment Pain Scale: 0-10 Pain Score:7.5- 8  Pain Type: Surgical pain;Chronic pain Pain Location: Back Pain Orientation: Mid;Lower Pain Descriptors / Indicators: Aching Pain Frequency: Intermittent Pain Onset: On-going Pain Intervention(s): Medication (See eMAR)  Exercises: Red T-band    Therapy/Group: Individual Therapy  Hermina Barters 09/13/2022, 9:04 AM

## 2022-09-13 NOTE — Progress Notes (Signed)
Patient ID: [name], female   DOB: 08-05-1955, 67 y.o.   MRN: [id number]  Met with son earlier today to discuss grounds pass, seeing a dietician and having neuro-psych see pt while here. MD ordered grounds pass and asked dietician to see pt regarding food choices. Placed on neuro-psych list to be seen tomorrow and if needed can have psychiatry see also while here. Hopefully trach will be discharged today or tomorrow. Team conference on Wednesday.

## 2022-09-13 NOTE — Progress Notes (Signed)
Pharmacy Antibiotic Note   [name] is a 67 y.o. 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   Continues on Vancomycin and Cefepime    Last vancomycin trough therapeutic at 14 mcg/mL.  Renal function remains stable.  Afebrile, WBC WNL.   Plan: Continue vanc '1250mg'$  IV Q12H Continue Cefepime 2g IV Q8H and Flagyl '500mg'$  IV Q8H Monitor renal function, culture results, weekly vanc trough  Thank you for involving pharmacy in this patient's care.  Vaughan Basta BS, PharmD, BCPS Clinical Pharmacist 09/13/2022 7:10 AM  Contact: [phone] after 3 PM  "Be curious, not judgmental..." -Jamal Maes

## 2022-09-13 NOTE — Consult Note (Signed)
[place] Nurse wound follow up Wound type: lumbar surgical wound with dehiscence at proximal end  slough remains at distal end, central area is nearly fully epithelialized.  The slough at the proximal end is pulling away, revealing a defect that is 2 cm deep.  Cannot visualize wound bed.  I will pack this area with skinny strip of aquacel Ag.   Measurement: Proximal:  0.3 cm x 0.2 cm x2 cm opening with slough to periwound Distal end:  1 cm slough Wound bed  see above Drainage (amount, consistency, odor) minimal serosanguinous  no odor Periwound: Healed incision line, pink and intact   Dressing procedure/placement/frequency: Cleanse with NS. Apply skinny strip aquacel into woundbed.. Cover nonintact areas with aquacel and cover with gauze and ABD pad and tape.  Change daily.  Will follow and see weekly to monitor progress.  Explained to patient and son that as slough breaks down, the depth of these areas reveal themselves and we will fill the defect as needed.  Estrellita Ludwig MSN, RN, FNP-BC CWON Wound, Ostomy, Continence Nurse [facility] [phone] Pager [phone]

## 2022-09-13 NOTE — Progress Notes (Signed)
Good night. Positive about doing therapy. "I've got to do this to get better." 2 mushy stools this shift, patient unaware incontinent. Foley patent. Bilateral buttocks and groin red-gerhardt's butt cream applied to buttocks. Offered to turn, but patient declined, reports unable to sleep on side. Bilateral heels red but blanchable, elevated off bed with pillows. Back dressing CD&I, changed on previous shift. Trach remains capped without issues. Encouraged her to use I.S. Son at bedside providing hands on assistance. Patrici Ranks A

## 2022-09-13 NOTE — Progress Notes (Signed)
Occupational Therapy Session Note  Patient Details  Name: [name] MRN: [id number] Date of Birth: June 22, 1956  Today's Date: 09/13/2022 OT Individual Time: [id number] OT Individual Time Calculation (min): 23 min  and Today's Date: 09/13/2022 OT Missed Time: 7 Minutes Missed Time Reason: Other (comment) (respiratory therapy)   Short Term Goals: Week 2:  OT Short Term Goal 1 (Week 2): Pt will complete sit > stand from EOB in stedy with mod +2 A OT Short Term Goal 2 (Week 2): Pt will maintain dynamic sitting balance with (S) for entire ADL routine EOB OT Short Term Goal 3 (Week 2): Pt will bridge in bed with mod A for reducing caregiver burden  Skilled Therapeutic Interventions/Progress Updates:  Skilled OT intervention completed with focus on toileting needs and BUE exercises. Pt received upright in bed, daughter present. No pain reported.  Pt was fixated on feeling like she had "feces all over her butt." Daughter reported that they have done toileting/brief changing 3 times in last 30 mins. Pt agreeable for OT to ensure dry brief. Able to roll > L side with min A. Foley still in place and no present BM or leakage. Offered pt a chance to clean bottom to ease her mind however she declined. Statements were made about the rectal tube being in place however pt needed reminding that rectal tube had been removed and that the tube she felt was the foley that was in a different spot than the rectum. Pt attempted pulling at foley stating "it doesn't have to be so tight" with cues needed to avoid this to prevent dislodgement.  Pt initially declined further therapy, however with daughters encouragement was reluctant to at least bed level exercises. Utilized 3 pound dowel for 2x10 chest presses, 2x10 bicep curls. Then respiratory showed up and pt was fixated on decannulation and if it could happen today; MD notified for both RT and pt/daughter about order being placed per request. Pt missed 7 mins of  OT intervention due to RT consult for capped trach. Will make up missed time as able.   Pt remained upright in bed with daughter present and direct care handoff to RT at end of session.  Therapy Documentation Precautions:  Precautions Precautions: Fall, Back Precaution Comments: trach, rectal tube, foley Required Braces or Orthoses: Other Brace Other Brace: prevlon boots Restrictions Weight Bearing Restrictions: No Other Position/Activity Restrictions: external rotation R hip at baseline at all times, RLE shorter than LLE, pt ambulated household distances PTA, w/c for community   Therapy/Group: Individual Therapy  Blase Mess, MS, OTR/L  09/13/2022, 3:42 PM

## 2022-09-13 NOTE — Progress Notes (Addendum)
PROGRESS NOTE   Subjective/Complaints:  Reports did not have any breathing issues overnight. She would like to speak with psychology. Her son would like her to speak with dietician about nutrition issues.  Son reports she was previously on high dose Seroquel.    ROS: +diarrhea-improved, +back pain-tolerable, +insomnia-improved,  +HA- only when moving and brief,  +LE swelling-improving. Denies fevers, chills, cough, CP, SOB, abd pain, N/V/C, new/worsening paresthesias/weakness, or any other complaints at this time.    Objective:   No results found. Recent Labs    09/13/22 0340  WBC 7.0  HGB 10.0*  HCT 30.8*  PLT 406*    Recent Labs    09/12/22 0503 09/13/22 0340  NA 129* 131*  K 3.9 4.2  CL 99 100  CO2 21* 22  GLUCOSE 110* 114*  BUN 6* 6*  CREATININE <0.30* 0.31*  CALCIUM 8.9 8.9     Intake/Output Summary (Last 24 hours) at 09/13/2022 1248 Last data filed at 09/13/2022 0500 Gross per 24 hour  Intake 477 ml  Output 4400 ml  Net -3923 ml         Physical Exam: Vital Signs Blood pressure (!) 141/75, pulse 93, temperature 98.4 F (36.9 C), temperature source Oral, resp. rate 18, height '5\' 1"'$  (1.549 m), weight 66.9 kg, SpO2 97 %.  Constitutional:  thin, NAD, laying in bed HENT: [place]/AT,  MMM.   Eyes: EOMI, conjunctiva clear, PERRL Neck:     Comments: #6 trach in place. Pt able to speak around trach fairly well. Excellent phonation with trach capped >24hrs now Cardiovascular: RRR, soft murmur heard (VSD), 1-2+ BLE swelling-slightly improved in LLE from yesterday Pulmonary: CTAB no w/r/r, no increased WOB Abdominal: Soft, nontender, nondistended, positive bowel sounds/normal active Genitourinary:    Comments:Sacral wound dressing clean, no stool near the area.   foley in place-yellow urine   PRIOR EXAM: Musculoskeletal:        General: Swelling present.     Cervical back: Normal range of motion.      Comments: Right leg discrepancy with weakness and leg rotated outward. Dependent edema Right forearm and hand and left forearm.   Skin:    Comments: Well healed old right thigh incision. Wound VAC- has been removed midline back dressing dry and clean, low back  Neurological:     Mental Status: She is alert and oriented to person, place, and time.     Comments: Alert and oriented x 3 (needed some help with day) much improved insight and awareness. [place] Memory. Normal language and speech. Cranial nerve exam unremarkable. UE 4/5 prox to distal. RLE 2-/5 prox to 4/5 distally. LLE 2+/5 prox to 4/5 distally. Senses pain in all 4 limbs. No abnl resting tone.   Psychiatric:     Appears a little anxious  Assessment/Plan: 1. Functional deficits which require 3+ hours per day of interdisciplinary therapy in a comprehensive inpatient rehab setting. Physiatrist is providing close team supervision and 24 hour management of active medical problems listed below. Physiatrist and rehab team continue to assess barriers to discharge/monitor patient progress toward functional and medical goals  Care Tool:  Bathing  Bathing activity did not occur: Refused  Bathing assist       Upper Body Dressing/Undressing Upper body dressing   What is the patient wearing?: Hospital gown only    Upper body assist Assist Level: Total Assistance - Patient < 25%    Lower Body Dressing/Undressing Lower body dressing      What is the patient wearing?: Hospital gown only     Lower body assist Assist for lower body dressing: Total Assistance - Patient < 25%     Toileting Toileting Toileting Activity did not occur Landscape architect and hygiene only): N/A (no void or bm) (using catheter and rectal tube)  Toileting assist Assist for toileting: Dependent - Patient 0%     Transfers Chair/bed transfer  Transfers assist  Chair/bed transfer activity did not occur: Safety/medical concerns         Locomotion Ambulation   Ambulation assist   Ambulation activity did not occur: Safety/medical concerns          Walk 10 feet activity   Assist  Walk 10 feet activity did not occur: Safety/medical concerns        Walk 50 feet activity   Assist Walk 50 feet with 2 turns activity did not occur: Safety/medical concerns         Walk 150 feet activity   Assist Walk 150 feet activity did not occur: Safety/medical concerns         Walk 10 feet on uneven surface  activity   Assist Walk 10 feet on uneven surfaces activity did not occur: Safety/medical concerns         Wheelchair     Assist Is the patient using a wheelchair?: Yes Type of Wheelchair: Manual Wheelchair activity did not occur: Safety/medical concerns         Wheelchair 50 feet with 2 turns activity    Assist    Wheelchair 50 feet with 2 turns activity did not occur: Safety/medical concerns       Wheelchair 150 feet activity     Assist  Wheelchair 150 feet activity did not occur: Safety/medical concerns       Blood pressure (!) 141/75, pulse 93, temperature 98.4 F (36.9 C), temperature source Oral, resp. rate 18, height '5\' 1"'$  (1.549 m), weight 66.9 kg, SpO2 97 %.  Medical Problem List and Plan: 1. Functional deficits secondary to osteomyelitis,infection of lumbar surgical site complicated by meningitis/ventriculitis.             -pt also with significant deconditioning             -patient may not yet shower             -ELOS/Goals: 20-27 days, Min/Mod A with PT, OT, SLP  -Continue CIR, PT OT SL   2.  Antithrombotics: -DVT/anticoagulation:  Pharmaceutical: Lovenox '40mg'$  QD             -antiplatelet therapy: N/a 3. Pain Management: Butrans 68mg/hr patch for pain control w/ Flexeril '5mg'$  TID, Tylenol PRN, Oxycodone 7.'5mg'$  q4h PRN, Gabapentin '100mg'$  TID  -09/04/22 increased flexeril to 7.'5mg'$  TID -09/05/22 schedule Oxycodone '5mg'$  q6h and leave 2.'5mg'$  q4h PRN dosing -2/26  tylenol PRN for HA 4. Mood/Behavior/Sleep: LCSW to follow for evaluation and support.             --Trazodone '100mg'$  QHS and melatonin '5mg'$  QHS PRN for  insomnia/mood  -09/04/22 placed sign on door to minimize interruptions 11p-5a; changed CBG checks as below to minimize interruptions             -  antipsychotic agents:  Luvox '100mg'$  QHS and Lamictal '200mg'$  QD  -Continue Aricept '10mg'$  QD  -Will ask neuropsych to see her 5. Neuropsych/cognition: This patient is not fully capable of making decisions on her own behalf. 6. Skin/Wound Care: WOC to follow for wound VAC changes --MASD managed by fecal system-- flexiseal clogged 09/04/22 AM but nursing states it's now working; monitor--might be able to d/c soon? -2/21 WOC stopped VAC, start aquacel and dressing -see flexiseal discussion below, #17 7. Fluids/Electrolytes/Nutrition: Monitor I/O. Continue tube feeds-->likely contributing to diarrhea             -monitor intake closely, continue vitamins             -09/04/22 CMP fairly stable -2/19 discontinue core track -09/11/22 NOTE: BMP DAILY ORDERED FOR 2/21-3/1 BUT WEEKLY ORDERED QMONDAY STARTING 2/19-- weekday team to clarify orders Consult dietician 8.Sepsis w/ osteomyelitis/meningitis/encephalitis: Continue Cefepime 2g q8h, Vanc '1250mg'$  q12h, and Flagyl '500mg'$  q8h for at least 6 weeks             --end date per ID. Will follow up on plan during admit  -pharmacy following for Vanc dosing- appreciate- appears checking today 9. H/o PVT:  Monitor BP/heart rate TID--on coreg 12.'5mg'$  BID, Labetaolol '10mg'$  q2h PRN  -09/04/22 BP/HR fairly well controlled, cont regimen, monitor  -2/23  BP has been a little above goal, increase coreg dose to '25mg'$  BID  -09/12/22 BP/HR stable, monitor    09/13/2022    8:24 AM 09/13/2022    5:00 AM 09/13/2022    3:34 AM  Vitals with BMI  Weight  147 lbs 8 oz   BMI  27.88   Pulse 93  76      10. Urinary retention: Has failed 4 voiding trails. Has had foley since 12/23              -maintain foley cath, foley mgt per protocol -- Continue Flomax 0.'4mg'$  qPM. Will d/c urecholine.  11. VDRF s/p trach: Tolerating PMSV well -I ordered downsizing to CFS # 4 today--tolerated well -continue ATC w/ pulmonary hygiene -begin day time plugging of trach tomorrow. Decannulate soon. -Trial of capping with respiratory-  can trial 24 hour capping today -2/22 Leave trach uncapped tonight -09/12/22 trach capped overnight-- wondering when this could be removed--weekday team to discuss -2/26 consider removal of trach- appears stable with trach capped -2/26 continue trach capped and monitor, consider decannulation later this week 12. T2DM: Monitor BS ac/hs and use SSI for elevated BS.              --continue tube feeds with supplements between meals due to poor intake             --continue liberalized diet for now. -09/04/22 CBGs looking great, Changed SSI and CBG checks to TID AC + QHS to minimize interruptions overnight, since tube feeds are decreased/temporarily stopped currently -09/12/22 controlled, continue current medications CBG (last 3)  Recent Labs    09/12/22 2145 09/13/22 0747 09/13/22 1157  GLUCAP 93 120* 131*     13. Hypokalemia: Recurrent likely due to diarrhea/intake.              --will add standing dose BID -->recheck labs on 02/17 and 02/19  -09/04/22 K+ 4.0, continue Klor 69mq BID, monitor on weekly labs -09/05/22 doesn't like Klor packet but no great alternative (Phos-Nak would have to be heavily dosed to get equivalent K+, so only other alternative is IV-- will hold off for now) -2/19 Increase K+ supplement  to 28mq BID, could consider change to tablet however this is a large tablet and she had trouble swallowing it previously so continue packet for now -2/20, discussed with pharmacy, change to tablet potassium, IV K+ ordered , daily labs -2/23 K+ down to 3.4, increase K+ supplement 260m from BID to TID -2/26 stable at 4.2 14. Anemia of critical illness: Hgb  7.5-8.9 range -2/26 stable at HGB 10 15. Hyponatremia/hypochloremia: Improving 125/87-->130/94.  -09/04/22 Na 129, Cl 95, both stable, continue salt tabs 1g BID -Cortisol testing done during hospitalization (13.8 on 08/30/22), was normal, but daughter and pt want to make sure this is followed up--defer to weekday team regarding ongoing ?Addison's testing -2/21 129, a little decreased, fluid restriction started -2/26 stable at 131 16. Encephalopathy/Delirium: Continues to have intermittent confusion--but much improved compared to 3 days ago per family.  --Continue delirium precautions. 17. Loose stools likely d/t TF and abx.             -reducing TF as above.  -flexiseal clogged 09/04/22 AM but nursing states it's now working; monitor--might be able to d/c soon? -add probiotics and fiber-- fiber changed to [employer] [address] on 09/04/22 since this should cause less clogging of Cortrak -Lomotil 1-2 tabs QID PRN -2/21 improved, rectal tube to help keep wound clean -2/22 continue rectal tube for now -09/11/22 rectal tube dislodged, stools clogging it d/t more formed, d/c for now and see how she does with bedpan, if soiling near wound then needs replacement of flexiseal -09/12/22 stools type 5-6, not super frequent, using diaper not bedpan-- looks like no stool is getting close to wound, but advised trying bedpan. Advised nursing that if stool becomes more frequent or more type 6-7, then flexiseal would need to be replaced. Monitor closely.  -2/26 appears improved per documentation, continue to monitor 18. Hypothyroidism: continue synthroid [id number] 19. Chronic combined CHF, BLE Swelling: recent 2D echo done 08/20/2022 showed apical hypokinesis, LVEF 40-45%, RV function was reduced. Intermittently diuresed during hospitalization, most recently given '40mg'$  IV lasix on 09/03/22 -09/04/22 noted to still have BLE swelling, start '20mg'$  PO lasix and monitor lytes/BP/swelling -2/23 stable, continue to  monitor -09/11/22 wt up a little, but swelling fairly stable; encouraged TED hose use during day -09/12/22 Wt stable, BLE swelling a little better but mostly stable, asked nursing to please placed TED hose during the day Filed Weights   09/11/22 0634 09/12/22 0629 09/13/22 0500  Weight: 67.8 kg 67.4 kg 66.9 kg      LOS: 10 days A FACE TO FACE EVALUATION WAS PERFORMED  YurJennye Boroughs26/2024, 12:48 PM

## 2022-09-14 DIAGNOSIS — I5042 Chronic combined systolic (congestive) and diastolic (congestive) heart failure: Secondary | ICD-10-CM

## 2022-09-14 DIAGNOSIS — G3184 Mild cognitive impairment, so stated: Secondary | ICD-10-CM

## 2022-09-14 LAB — GLUCOSE, CAPILLARY
Glucose-Capillary: 120 mg/dL — ABNORMAL HIGH (ref 70–99)
Glucose-Capillary: 202 mg/dL — ABNORMAL HIGH (ref 70–99)
Glucose-Capillary: 78 mg/dL (ref 70–99)
Glucose-Capillary: 169 mg/dL — ABNORMAL HIGH (ref 70–99)

## 2022-09-14 MED ORDER — CHLORHEXIDINE GLUCONATE CLOTH 2 % EX PADS
6.0000 | MEDICATED_PAD | Freq: Two times a day (BID) | CUTANEOUS | Status: DC
  Administered 2022-09-15 – 2022-10-05 (×35): 6 via TOPICAL

## 2022-09-14 NOTE — Progress Notes (Signed)
Physical Therapy Session Note  Patient Details  Name: [name] MRN: [id number] Date of Birth: 1955-08-23  Today's Date: 09/14/2022 PT Individual Time: 1045-1200 PT Individual Time Calculation (min): 75 min   Short Term Goals: Week 1:  PT Short Term Goal 1 (Week 1): Pt will perform bed mobility with MaxA +1. PT Short Term Goal 2 (Week 1): Pt will maintain unsupported seated balance with overall CGA for >5 min. PT Short Term Goal 3 (Week 1): Pt will perform sit<>stand with MaxA +1. PT Short Term Goal 4 (Week 1): Pt will perform seat-to-seat transfers with MaxA +1 PT Short Term Goal 5 (Week 1): Pt will initiate pre-gait training.  Skilled Therapeutic Interventions/Progress Updates: Pt presented in bed with son present agreeable to therapy. Pt c/o pain 8/10, premedicated. Rest breaks and repositioning provided as needed during session. Performed supine to sit maxA x 1. Pt required maxA to scoot anterioly to EOB. Set up for transfer to TIS. Attempted squat pivot transfer however pt unable to use enough power in UE to appropriately assist. Pt then set up with RW and agreeable to stand pivot. It required several attempts however pt was able to complete stand pivot transfer with RW and maxA x 2. Pt then transported to main gym and set up in standing frame. Pt made several attempts however was unable to tolerate strap against low back/buttocks. Pt then transferred over to parallel bars and after several attempts pt was able to complete Sit to stand with maxA x 2 and maintain standing ~30 seconds. Pt with limited tolerance maintain standing without total A. Pt then transported to day room and set up at [employer]. Participated on Cybex Kinetron 90cm/sec 2 sets of 10 cycles. Pt able to minimally initiate and required assistance from PTA for completion of cycle. Pt transported back to room and after multiple attempts pt performed near dependent x 2 (+3 present for safety) stand pivot transfer with  RW. Pt returned to supine with maxA x 1 but required dependent +2 for repositioning. Pt left in bed at end of session with bed alarm on, call bell within reach and needs met.      Therapy Documentation Precautions:  Precautions Precautions: Fall, Back Precaution Comments: trach, rectal tube, foley Required Braces or Orthoses: Other Brace Other Brace: prevlon boots Restrictions Weight Bearing Restrictions: No Other Position/Activity Restrictions: external rotation R hip at baseline at all times, RLE shorter than LLE, pt ambulated household distances PTA, w/c for community General:   Vital Signs: Oxygen Therapy SpO2: 93 % O2 Device: Room Air FiO2 (%): 21 % Pain: Pain Assessment Pain Scale: 0-10 Pain Score: 6  Pain Type: Surgical pain;Chronic pain Pain Location: Back Pain Orientation: Lower;Mid Pain Descriptors / Indicators: Aching Pain Frequency: Intermittent Pain Intervention(s): Medication (See eMAR) Mobility:   Locomotion :    Trunk/Postural Assessment :    Balance:   Exercises:   Other Treatments:      Therapy/Group: Individual Therapy  Attikus Bartoszek 09/14/2022, 12:46 PM

## 2022-09-14 NOTE — Progress Notes (Signed)
Speech Language Pathology Daily Session Note  Patient Details  Name: [name] MRN: [id number] Date of Birth: 1956/05/14  Today's Date: 09/14/2022 SLP Individual Time: 1430-1535 SLP Individual Time Calculation (min): 65 min  Short Term Goals: Week 2: SLP Short Term Goal 1 (Week 2): Pt will use external memory aids to recall important information with min-to-mod A verbal cues. SLP Short Term Goal 2 (Week 2): Pt will recall 2 areas of cognitive impairment with min-to-modA verbal cues. SLP Short Term Goal 3 (Week 2): Pt will participate in therapeutic task for 20 minutes with min-to-modA verbal cues. SLP Short Term Goal 4 (Week 2): Patient will complete functional and semi-complex problem solving tasks with min-to-mod A verbal cues  Skilled Therapeutic Interventions: Skilled ST treatment focused on cognitive goals. Pt was greeted semi-reclined in bed on arrival and accompanied by her two daughters. Pt demonstrates signs of cognitive fatigue and reduced frustration tolerance as session progressed however remained appropriate and participative. Daughters showed SLP a Designer, industrial/product workbook that pt independently and successfully ordered through [employer] & delivered to pt's home. Pt had not written in memory notebook since speech session yesterday. SLP reinforced uses and reviewed ways to format/organize. SLP facilitated working memory, sequencing, and sustained attention tasks with overall min A verbal redirection cues, which increased to mod A verbal cues toward end of session. SLP facilitated a functional memory task using spaced retrieval therapy to recall a grocery list of 6 items. Pt recalled 0/6 following 1 minute delay without implementation of any memory strategies. Following education on association, repetition, and visualization strategies, pt was able to recall 3/6 with sup A verbal cues following 2 minute delay, and progressing to 6/6 with mod A verbal cues following 5 minute delay. Pt  requested to use bedside commode. Transferred pt with +3 A. Pt requiring heavy max A to stand and transfer to commode using [name]. Pt became quickly fatigued and required significantly increased support with each sit-to-stand. Pt required total A for peri care. Pt was returned to bed and left with alarm activated and immediate needs within reach at end of session. Continue per current plan of care.      Pain  None/denied at rest  Therapy/Group: Individual Therapy  [name] 09/14/2022, 8:36 PM

## 2022-09-14 NOTE — Progress Notes (Signed)
Occupational Therapy Session Note  Patient Details  Name: [name] MRN: [id number] Date of Birth: 12-17-1955  Today's Date: 09/14/2022 OT Individual Time: 0848-1000 OT Individual Time Calculation (min): 72 min    Short Term Goals: Week 2:  OT Short Term Goal 1 (Week 2): Pt will complete sit > stand from EOB in stedy with mod +2 A OT Short Term Goal 2 (Week 2): Pt will maintain dynamic sitting balance with (S) for entire ADL routine EOB OT Short Term Goal 3 (Week 2): Pt will bridge in bed with mod A for reducing caregiver burden  Skilled Therapeutic Interventions/Progress Updates:    Pt received supine with no c/o pain, agreeable to OT session. Son present during session. Pt came to EOB with max A. Improved trunk elevation and initiation. She sat EOB with (S) for several minutes. Pt benefiting from edu re plan for session and getting easily frustrated with "not knowing what is going on". She completed 3x sit <> stand from EOB with max A +1. Improved initiation. +2 for stand pivot to the w/c using the RW. Poor terminal hip extension and anterior weight shift. She completed another 2x sit <> stand at the sink with max A to don new brief. Pt given a couple min to eat breakfast- consuming 25% frosted flakes and some of an ensure. She was taken via w/c to the therapy gym. Set up activity and then pt reported need to have BM. She completed a stand pivot transfer with max A of 2 to the bariatric BSC. Pillows placed behind her back to increase sitting tolerance. Pt able to void loose BM. She required total A for toileting tasks. She stood with max A+2 ,now very fatigued from session. Stand pivot with max +2 back to the TIS w/c and then back to bed per her request. She required max A to transition into supine. She was left supine with all needs met, son present ,bed alarm set. Did attempt donning R PRAFO but pt unable to tolerate after 5 min of wearing.    Therapy Documentation Precautions:   Precautions Precautions: Fall, Back Precaution Comments: trach, rectal tube, foley Required Braces or Orthoses: Other Brace Other Brace: prevlon boots Restrictions Weight Bearing Restrictions: No Other Position/Activity Restrictions: external rotation R hip at baseline at all times, RLE shorter than LLE, pt ambulated household distances PTA, w/c for community  Therapy/Group: Individual Therapy  Curtis Sites 09/14/2022, 6:12 AM

## 2022-09-14 NOTE — Progress Notes (Signed)
Physical Therapy Weekly Progress Note  Patient Details  Name: [name] MRN: [id number] Date of Birth: 1955/12/10  Beginning of progress report period: September 04, 2022 End of progress report period: September 14, 2022  Today's Date: 09/14/2022 PT Individual Time: 1045-1200 PT Individual Time Calculation (min): 75 min   Patient has met 2 of 5 short term goals.  Pt is making slow but steady gains during current course of therapy. Pt has made most improvements in sitting balance and bed mobility. Pt has been limited by initiation and decreased endurance. Therefore pt has successfully been able to perform 1-2 standing tasks per session requiring max of 1-2 people however lack of endurance has limited any additional progression. With both a [name] and RW pt has intermittently been able to stand with maxA x 1 but does not have the endurance to stand more then 10 seconds. Family has been present during sessions to help provide encouragement and observe pt's progression.   Patient continues to demonstrate the following deficits muscle weakness and muscle joint tightness, decreased cardiorespiratoy endurance, impaired timing and sequencing, unbalanced muscle activation, decreased coordination, and decreased motor planning, and decreased initiation, decreased attention, decreased awareness, decreased safety awareness, and decreased memory and therefore will continue to benefit from skilled PT intervention to increase functional independence with mobility.  Patient progressing toward long term goals..  Continue plan of care.  PT Short Term Goals Week 1:  PT Short Term Goal 1 (Week 1): Pt will perform bed mobility with MaxA +1. PT Short Term Goal 1 - Progress (Week 1): Met PT Short Term Goal 2 (Week 1): Pt will maintain unsupported seated balance with overall CGA for >5 min. PT Short Term Goal 2 - Progress (Week 1): Met PT Short Term Goal 3 (Week 1): Pt will perform sit<>stand with MaxA +1. PT  Short Term Goal 3 - Progress (Week 1): Progressing toward goal PT Short Term Goal 4 (Week 1): Pt will perform seat-to-seat transfers with MaxA +1 PT Short Term Goal 4 - Progress (Week 1): Progressing toward goal PT Short Term Goal 5 (Week 1): Pt will initiate pre-gait training. PT Short Term Goal 5 - Progress (Week 1): Progressing toward goal Week 2:  PT Short Term Goal 1 (Week 2): Pt will perform bed mobility with ModA +1. PT Short Term Goal 2 (Week 2): Pt will perform sit<>stand with MaxA +1. PT Short Term Goal 3 (Week 2): Pt will perform seat-to-seat transfers with MaxA +1 PT Short Term Goal 4 (Week 2): Pt with perform standing balance with alternating bil/single UE support for 1-2 minutes. PT Short Term Goal 5 (Week 2): Pt will initiate pre-gait training.       Therapy Documentation Precautions:  Precautions Precautions: Fall, Back Precaution Comments: trach, rectal tube, foley Required Braces or Orthoses: Other Brace Other Brace: prevlon boots Restrictions Weight Bearing Restrictions: No Other Position/Activity Restrictions: external rotation R hip at baseline at all times, RLE shorter than LLE, pt ambulated household distances PTA, w/c for community General:   Vital Signs: Therapy Vitals Temp: 98 F (36.7 C) Pulse Rate: 80 Resp: 18 BP: (!) 148/80 Patient Position (if appropriate): Lying Oxygen Therapy SpO2: 100 % O2 Device: Room Air FiO2 (%): 21 % Pain: Pain Assessment Pain Scale: 0-10 Pain Score: 7  Pain Type: Surgical pain Pain Location: Back Pain Descriptors / Indicators: Aching Pain Frequency: Constant Pain Onset: On-going Pain Intervention(s): Medication (See eMAR) Vision/Perception     Mobility:   Locomotion :    Trunk/Postural  Assessment :    Balance:   Exercises:   Other Treatments:     Therapy/Group: Individual Therapy  Rosita DeChalus 09/14/2022, 3:41 PM     [place], DPT Acute Rehabilitation Services Office  [phone]  09/14/22 4:17 PM

## 2022-09-14 NOTE — Progress Notes (Signed)
PROGRESS NOTE   Subjective/Complaints:  Pt working with therapy in the gym. She is frustrated with being in the hospital. Denies any  new concerns this AM otherwise. No issues overnight with trach capped.    ROS: +diarrhea-improved, +back pain-tolerable, +insomnia-improved,  +HA- intermittent +LE swelling-improving. Denies fevers, chills, cough, CP, SOB, abd pain, N/V/C, new/worsening paresthesias/weakness, vision changes or any other complaints at this time.    Objective:   No results found. Recent Labs    09/13/22 0340  WBC 7.0  HGB 10.0*  HCT 30.8*  PLT 406*     Recent Labs    09/12/22 0503 09/13/22 0340  NA 129* 131*  K 3.9 4.2  CL 99 100  CO2 21* 22  GLUCOSE 110* 114*  BUN 6* 6*  CREATININE <0.30* 0.31*  CALCIUM 8.9 8.9     Intake/Output Summary (Last 24 hours) at 09/14/2022 0954 Last data filed at 09/14/2022 0900 Gross per 24 hour  Intake 4763.6 ml  Output 4000 ml  Net 763.6 ml         Physical Exam: Vital Signs Blood pressure (!) 144/81, pulse 82, temperature 98.1 F (36.7 C), temperature source Oral, resp. rate 18, height '5\' 1"'$  (1.549 m), weight 66.7 kg, SpO2 98 %.  Constitutional:  thin, NAD, in WC in GYM HENT: [place]/AT,  MMM  Eyes: EOMI, conjunctiva clear, PERRL Neck:     Comments: #6 trach in place. Excellent phonation with trach capped >24hrs now Cardiovascular: RRR, soft murmur heard (VSD), 1-2+ BLE swelling-slightly improved in LLE from yesterday Pulmonary: CTAB no w/r/r, no increased WOB Abdominal: Soft, nontender, nondistended, positive bowel sounds/normal active Genitourinary:    Comments:Sacral wound dressing clean  foley in place-yellow urine Neurological: Follows commands, normal speech and language, Alert and oriented x3, no hypertonia noted  PRIOR EXAM: Musculoskeletal:        General: Swelling present.     Cervical back: Normal range of motion.     Comments: Right leg  discrepancy with weakness and leg rotated outward. Dependent edema Right forearm and hand and left forearm.   Skin:    Comments: Well healed old right thigh incision. Wound VAC- has been removed midline back dressing dry and clean, low back  Neurological:     Mental Status: She is alert and oriented to person, place, and time.     Comments: Alert and oriented x 3 (needed some help with day) much improved insight and awareness. [place] Memory. Normal language and speech. Cranial nerve exam unremarkable. UE 4/5 prox to distal. RLE 2-/5 prox to 4/5 distally. LLE 2+/5 prox to 4/5 distally. Senses pain in all 4 limbs. No abnl resting tone.   Psychiatric:     Appears a little anxious  Assessment/Plan: 1. Functional deficits which require 3+ hours per day of interdisciplinary therapy in a comprehensive inpatient rehab setting. Physiatrist is providing close team supervision and 24 hour management of active medical problems listed below. Physiatrist and rehab team continue to assess barriers to discharge/monitor patient progress toward functional and medical goals  Care Tool:  Bathing  Bathing activity did not occur: Refused           Bathing assist  Upper Body Dressing/Undressing Upper body dressing   What is the patient wearing?: Hospital gown only    Upper body assist Assist Level: Total Assistance - Patient < 25%    Lower Body Dressing/Undressing Lower body dressing      What is the patient wearing?: Hospital gown only     Lower body assist Assist for lower body dressing: Total Assistance - Patient < 25%     Toileting Toileting Toileting Activity did not occur Landscape architect and hygiene only): N/A (no void or bm) (using catheter and rectal tube)  Toileting assist Assist for toileting: Dependent - Patient 0%     Transfers Chair/bed transfer  Transfers assist  Chair/bed transfer activity did not occur: Safety/medical concerns  Chair/bed transfer assist level: 2  Helpers     Locomotion Ambulation   Ambulation assist   Ambulation activity did not occur: Safety/medical concerns          Walk 10 feet activity   Assist  Walk 10 feet activity did not occur: Safety/medical concerns        Walk 50 feet activity   Assist Walk 50 feet with 2 turns activity did not occur: Safety/medical concerns         Walk 150 feet activity   Assist Walk 150 feet activity did not occur: Safety/medical concerns         Walk 10 feet on uneven surface  activity   Assist Walk 10 feet on uneven surfaces activity did not occur: Safety/medical concerns         Wheelchair     Assist Is the patient using a wheelchair?: Yes Type of Wheelchair: Manual Wheelchair activity did not occur: Safety/medical concerns         Wheelchair 50 feet with 2 turns activity    Assist    Wheelchair 50 feet with 2 turns activity did not occur: Safety/medical concerns       Wheelchair 150 feet activity     Assist  Wheelchair 150 feet activity did not occur: Safety/medical concerns       Blood pressure (!) 144/81, pulse 82, temperature 98.1 F (36.7 C), temperature source Oral, resp. rate 18, height '5\' 1"'$  (1.549 m), weight 66.7 kg, SpO2 98 %.  Medical Problem List and Plan: 1. Functional deficits secondary to osteomyelitis,infection of lumbar surgical site complicated by meningitis/ventriculitis.             -pt also with significant deconditioning             -patient may not yet shower             -ELOS/Goals: 20-27 days, Min/Mod A with PT, OT, SLP  -Continue CIR, PT OT SL  -Team conference tomorrow   2.  Antithrombotics: -DVT/anticoagulation:  Pharmaceutical: Lovenox '40mg'$  QD             -antiplatelet therapy: N/a 3. Pain Management: Butrans 26mg/hr patch for pain control w/ Flexeril '5mg'$  TID, Tylenol PRN, Oxycodone 7.'5mg'$  q4h PRN, Gabapentin '100mg'$  TID  -09/04/22 increased flexeril to 7.'5mg'$  TID -09/05/22 schedule Oxycodone '5mg'$  q6h  and leave 2.'5mg'$  q4h PRN dosing -2/26 tylenol PRN for HA 4. Mood/Behavior/Sleep: LCSW to follow for evaluation and support.             --Trazodone '100mg'$  QHS and melatonin '5mg'$  QHS PRN for  insomnia/mood  -09/04/22 placed sign on door to minimize interruptions 11p-5a; changed CBG checks as below to minimize interruptions             -  antipsychotic agents:  Luvox '100mg'$  QHS and Lamictal '200mg'$  QD  -Continue Aricept '10mg'$  QD  -Will ask neuropsych to see her 5. Neuropsych/cognition: This patient is not fully capable of making decisions on her own behalf. 6. Skin/Wound Care: WOC to follow for wound VAC changes --MASD managed by fecal system-- flexiseal clogged 09/04/22 AM but nursing states it's now working; monitor--might be able to d/c soon? -2/21 WOC stopped VAC, start aquacel and dressing -see flexiseal discussion below, #17 -2/27 WOC following, slough pulled away revealing 2cm deep defect 7. Fluids/Electrolytes/Nutrition: Monitor I/O. Continue tube feeds-->likely contributing to diarrhea             -monitor intake closely, continue vitamins             -09/04/22 CMP fairly stable -2/19 discontinue core track Consult dietician -2/27 decrease BMP to every other day  8.Sepsis w/ osteomyelitis/meningitis/encephalitis: Continue Cefepime 2g q8h, Vanc '1250mg'$  q12h, and Flagyl '500mg'$  q8h for at least 6 weeks             --end date per ID. Will follow up on plan during admit  -pharmacy following for Vanc dosing- appreciate- appears checking today  9. H/o PVT:  Monitor BP/heart rate TID--on coreg 12.'5mg'$  BID, Labetaolol '10mg'$  q2h PRN  -09/04/22 BP/HR fairly well controlled, cont regimen, monitor  -2/23  BP has been a little above goal, increase coreg dose to '25mg'$  BID  -2/27 fairly well controlled overall     09/14/2022    7:32 AM 09/14/2022    6:34 AM 09/14/2022    4:07 AM  Vitals with BMI  Weight  147 lbs 1 oz   BMI  0000000   Systolic  123456   Diastolic  81   Pulse 82 90 80      10. Urinary  retention: Has failed 4 voiding trails. Has had foley since 12/23             -maintain foley cath, foley mgt per protocol -- Continue Flomax 0.'4mg'$  qPM. Will d/c urecholine.   11. VDRF s/p trach: Tolerating PMSV well -I ordered downsizing to CFS # 4 today--tolerated well -continue ATC w/ pulmonary hygiene -begin day time plugging of trach tomorrow. Decannulate soon. -Trial of capping with respiratory-  can trial 24 hour capping today -2/22 Leave trach uncapped tonight -09/12/22 trach capped overnight-- wondering when this could be removed--weekday team to discuss -2/27 Consider decannulation early this week if stable   12. T2DM: Monitor BS ac/hs and use SSI for elevated BS.              --continue tube feeds with supplements between meals due to poor intake             --continue liberalized diet for now. -09/04/22 CBGs looking great, Changed SSI and CBG checks to TID AC + QHS to minimize interruptions overnight, since tube feeds are decreased/temporarily stopped currently -2/27 well controlled, continue current medications    CBG (last 3)  Recent Labs    09/13/22 1632 09/13/22 1936 09/14/22 0640  GLUCAP 144* 90 120*     13. Hypokalemia: Recurrent likely due to diarrhea/intake.              --will add standing dose BID -->recheck labs on 02/17 and 02/19  -09/04/22 K+ 4.0, continue Klor 18mq BID, monitor on weekly labs -09/05/22 doesn't like Klor packet but no great alternative (Phos-Nak would have to be heavily dosed to get equivalent K+, so only other alternative is IV-- will hold  off for now) -2/19 Increase K+ supplement to 20mq BID, could consider change to tablet however this is a large tablet and she had trouble swallowing it previously so continue packet for now -2/20, discussed with pharmacy, change to tablet potassium, IV K+ ordered , daily labs -2/23 K+ down to 3.4, increase K+ supplement 29m from BID to TID -2/26 stable at 4.2 14. Anemia of critical illness: Hgb 7.5-8.9  range -2/26 stable at HGB 10 15. Hyponatremia/hypochloremia: Improving 125/87-->130/94.  -09/04/22 Na 129, Cl 95, both stable, continue salt tabs 1g BID -Cortisol testing done during hospitalization (13.8 on 08/30/22), was normal, but daughter and pt want to make sure this is followed up--defer to weekday team regarding ongoing ?Addison's testing -2/21 129, a little decreased, fluid restriction started -2/26 stable at 131 16. Encephalopathy/Delirium: Continues to have intermittent confusion--but much improved compared to 3 days ago per family.  --Continue delirium precautions. 17. Loose stools likely d/t TF and abx.             -reducing TF as above.  -flexiseal clogged 09/04/22 AM but nursing states it's now working; monitor--might be able to d/c soon? -add probiotics and fiber-- fiber changed to [employer] [place] [address] on 09/04/22 since this should cause less clogging of Cortrak -Lomotil 1-2 tabs QID PRN -2/21 improved, rectal tube to help keep wound clean -2/22 continue rectal tube for now -09/11/22 rectal tube dislodged, stools clogging it d/t more formed, d/c for now and see how she does with bedpan, if soiling near wound then needs replacement of flexiseal -09/12/22 stools type 5-6, not super frequent, using diaper not bedpan-- looks like no stool is getting close to wound, but advised trying bedpan. Advised nursing that if stool becomes more frequent or more type 6-7, then flexiseal would need to be replaced. Monitor closely.  -2/27 having regular daily Bms  18. Hypothyroidism: continue synthroid [id number] 19. Chronic combined CHF, BLE Swelling: recent 2D echo done 08/20/2022 showed apical hypokinesis, LVEF 40-45%, RV function was reduced. Intermittently diuresed during hospitalization, most recently given '40mg'$  IV lasix on 09/03/22 -09/04/22 noted to still have BLE swelling, start '20mg'$  PO lasix and monitor lytes/BP/swelling -2/23 stable, continue to monitor -09/11/22 wt up a little, but swelling fairly  stable; encouraged TED hose use during day -2/27 wt stable, continue to monitor Filed Weights   09/12/22 0629 09/13/22 0500 09/14/22 0634  Weight: 67.4 kg 66.9 kg 66.7 kg      LOS: 11 days A FACE TO FACE EVALUATION WAS PERFORMED  YurJennye Boroughs27/2024, 9:54 AM

## 2022-09-14 NOTE — Progress Notes (Signed)
Nutrition Follow-up  DOCUMENTATION CODES:   Not applicable  INTERVENTION:  - Continue Ensure Enlive po TID, each supplement provides 350 kcal and 20 grams of protein.   - Add [employer] Essentials TID, each packet mixed with 8 ounces of 2% milk provides 13 grams of protein and 260 calories.   NUTRITION DIAGNOSIS:   Increased nutrient needs related to wound healing as evidenced by estimated needs.  GOAL:   Patient will meet greater than or equal to 90% of their needs - Progressing.   MONITOR:   PO intake, Supplement acceptance, Skin, Labs  REASON FOR ASSESSMENT:   Consult Assessment of nutrition requirement/status  ASSESSMENT:   67 y.o. female admitted to CIR after revision of lumbar surgery after non healing and exposed hardware. Required trach and Cortrak placement. PMH includes HTN, GERD, HLD and T2DM.  Meds reviewed: pepcid, nutrisource fiber, sliding scale insulin., MVI, Klor-con, florastor. Labs reviewed: Na low.   Pt was receiving pt care at time of assessment but, RD was able to talk with family at bedside. Family reports that the pt's appetite continues to improve. They also report that she has been drinking the supplements when she receives the cold chocolate flavor. RD will add CIB to meal trays in the chocolate flavor so the pt always receives a chocolate flavored supplement with each meal. Will continue to monitor PO intakes.   Diet Order:   Diet Order             Diet regular Fluid consistency: Thin; Fluid restriction: 1200 mL Fluid  Diet effective now                   EDUCATION NEEDS:   No education needs have been identified at this time  Skin:  Skin Assessment: Skin Integrity Issues: Skin Integrity Issues:: Wound VAC, Incisions Wound Vac: Back Incisions: Back  Last BM:  2/27  Height:   Ht Readings from Last 1 Encounters:  09/03/22 '5\' 1"'$  (1.549 m)    Weight:   Wt Readings from Last 1 Encounters:  09/14/22 66.7 kg     Ideal Body Weight:  45.5 kg  BMI:  Body mass index is 27.78 kg/m.  Estimated Nutritional Needs:   Kcal:  1800-2000  Protein:  90-110 grams  Fluid:  >/= 1.8 L  Thalia Bloodgood, RD, LDN, CNSC.

## 2022-09-14 NOTE — Plan of Care (Signed)
Goals downgraded due to slow progress Problem: RH Attention Goal: LTG Patient will demonstrate this level of attention during functional activites (SLP) Description: LTG:  Patient will will demonstrate this level of attention during functional activites (SLP) Flowsheets (Taken 09/14/2022 0756) LTG: Patient will demonstrate this level of attention during cognitive/linguistic activities with assistance of (SLP): Minimal Assistance - Patient > 75%   Problem: RH Awareness Goal: LTG: Patient will demonstrate awareness during functional activites type of (SLP) Description: LTG: Patient will demonstrate awareness during functional activites type of (SLP) Flowsheets (Taken 09/14/2022 0756) LTG: Patient will demonstrate awareness during cognitive/linguistic activities with assistance of (SLP): Minimal Assistance - Patient > 75%   Goal added Problem: RH Problem Solving Goal: LTG Patient will demonstrate problem solving for (SLP) Description: LTG:  Patient will demonstrate problem solving for basic/complex daily situations with cues  (SLP) Flowsheets (Taken 09/14/2022 0757) LTG: Patient will demonstrate problem solving for (SLP): Complex daily situations LTG Patient will demonstrate problem solving for: Minimal Assistance - Patient > 75%

## 2022-09-14 NOTE — Consult Note (Signed)
Neuropsychological Consultation Comprehensive Inpatient Rehab   Patient:   [name]   DOB:   [birth year]  MR Number:  [id number]  Location:  [facility] [id number] [place] [address] Dept: [place]: [phone]           Date of Service:   09/14/2022  Start Time:   1 PM End Time:   2 PM  Today's visit was conducted in the inpatient comprehensive inpatient rehabilitation unit.  The patient, her 2 daughters and myself were present.  1 hour was spent in face-to-face clinical interview/therapeutic interventions.  Provider/Observer:  Ilean Skill, Psy.D.       Clinical Neuropsychologist       Billing Code/Service: [phone]  Reason for Service:    [name] is a 67 year old female referred for neuropsychological consultation during her ongoing CIR therapeutic interventions.  The patient has been followed by [name] and most recently saw [name], NP with a diagnosis of mild cognitive impairment and has been the primary patient of [name], MD.  Considerations have been made previously about the diagnosis of cognitive change with the family history of diagnosis of Alzheimer's and the patient has been maintained on Aricept as a prophylactic treatment for cognitive difficulties.  The patient also has a significant long-term psychiatric condition and was first diagnosed when she was 67 years old with bipolar disorder with symptoms that go back to her late teens early 21s.  Patient has had significant depression and manic/hypomanic episodes in the past.  Patient has had acute encephalopathic status with a recent hospitalization and is improving but continues to have confusion and reduced cognitive functioning from baseline.  However the patient is improving and becoming more oriented.  Some of her psychiatric medications have been held recently  because of potential interactions with her acute medical status including the patient's previous home medicine Seroquel (700 mg/day).  The patient continues with ongoing confusion and improving encephalopathic status due to infection/sepsis.  Patient with significant medical history of both long-term as well as acute status and details of this can be found below where a copy of her current HPI is included for convenience.  HPI for the current admission:    HPI:  [name]. Key is a 67 year old female with history of PVT, T2DM, small membranous VSD, depression, bipolar d/o, mild cognitive impairment,, fall 05/2022 with 3 column unstable fracture through prior scoliosis construct s/p instrumentation with exposed hardware and wound dehiscence. She was admitted on 08/09/2022 for revision of complex lumbar wound, removal of L1-L5 lumbar screws and rods with revision L1-L5 posterior fusion by Dr. [name].    Postop had issues with fatigue cough, hypotension as well as confusion overnight on 01/24. BLE Dopplers ordered for follow-up on 01/22 and was negative for DVT and showed hypoechoic collection in left popliteal fossa.  Foley placed due to ongoing issues with urinary retention.  Due to ongoing confusion and lethargy, antibiotics added due to concerns of UTI and oxycodone dose decreased due to concerns of side effects.  She started developing drainage from her wound on 01/28 as well as watery diarrhea and stool was negative for C. difficile.   She had rising white count to 19.9 with lactic acidosis on 01/31.  She was started on broad-spectrum antibiotics and continued to be febrile with Tmax 103.  ID consulted for input due to ongoing fevers and recommended changing antibiotics to  vancomycin, cefepime and metronidazole as well as repeat CTA chest/CT spine.  Chest CT showed multifocal patchy or base opacities throughout RUL, RML and bilateral lower lobes worrisome for multifocal pneumonia as well as  acute/subacute right 9-10 rib Fx and CT lumbar spine showed subacute fracture through L3 vertebral body and increased air-fluid and posterior soft tissues unexpected in setting of recent surgery.  CT head was negative for acute changes.     [name] consulted due to worsening of respiratory status due to sepsis from acute meningitis and asked patient pneumonia and she required intubation on 02/01.  Neurology consulted due to concerns of seizures and EEG showed moderate diffuse encephalopathy.  MRI brain done showing punctate foci of acute ischemia right frontal lobe and diffusion abnormality within the occipital horn of left lateral ventricle with proteinaceous/purulent debris.  MRI spine showed L2-L5 fusion widely patent with decrease in bone marrow edema L3.  Dr. [name] recommended continuing pain and Flagyl for aspiration pneumonia as well as CNS penetration and given hardware exposure, she would require at least 6 weeks of treatment empirically to treat osteomyelitis.  Tracheal aspirate positive for Candida but not felt to be a pathogen in lung per ID.   Blood cultures x 2 negative.  Core track placed for nutritional support. She was briefly extubated on 02/06 but reintubated due to fatigue and increased WOB.  She underwent tracheostomy on 02/06, was extubated to ATC and is tolerating PMV.  Follow-up MRI brain 12/13 showed "dependent diffusion abnormality in occipital horn of left lateral ventricle with ependymal enhancement concerning for ventriculitis". MBS done on 02/12 and patient cleared for regular diet w/thins. She did have emesis with episode of desaturation and copious secretions on 02/13. She has had multiple voiding trials--last on 02/10 and foley was replaced. Wound VAC changed today and reports of fecal management system in place due to ongoing loose stool and MASD/prevent wound contamination. Trach to be downsized to CFS# 4 today.    PT/OT has been working with patient who is showing  participation and progression with therapy. She is able to transfer on LLE with +2 max assist. She continues to be limited by weakness, cognitive deficits requiring increased time to process as well as verbal and tactile cues to follow simple commands, displays poor attention and memory. CIR recommended due to functional decline.   Medical History:   Past Medical History:  Diagnosis Date   Anemia    Anxiety    Arthritis    Bipolar disorder (HCC)    Depression    GERD (gastroesophageal reflux disease)    Heart murmur    "related to VSD"   High cholesterol    History of blood transfusion    "related to OR" (08/19/2016)   History of hiatal hernia    Hypertension    Hyperthyroidism    Mild cognitive impairment 09/06/2018   Paroxysmal ventricular tachycardia (HCC)    Type II diabetes mellitus (HCC)    UTI (urinary tract infection)    being treated with Keflex   Ventricular septal defect          Patient Active Problem List   Diagnosis Date Noted   Chronic combined systolic and diastolic CHF (congestive heart failure) ([place]) 09/14/2022   Acute blood loss anemia 09/09/2022   Infection of lumbar spine ([place]) 09/03/2022   Malnutrition of moderate degree 08/20/2022   Bacterial meningitis [id number]   Hardware complicating wound infection ([place]) 08/20/2022   Infection of deep incisional surgical site after procedure  08/20/2022   Encephalopathy 08/19/2022   Aspiration pneumonia of both lungs ([place]) 08/19/2022   Acute respiratory failure with hypoxia ([place]) 08/19/2022   Sepsis ([place]) 08/19/2022   Seizure ([place]) 08/19/2022   Wound dehiscence 08/09/2022   Delayed surgical wound healing 07/08/2022   Fracture of lumbar spine without cord injury ([place]) 06/26/2022   Lumbar vertebral fracture ([place]) 06/25/2022   Mood disorder ([place]) 09/29/2021   Mild cognitive impairment 09/06/2018   Hyponatremia 02/28/2018   S/P shoulder replacement, left 08/19/2016   Anxiety 10/17/2014   Acid reflux 10/17/2014    BP (high blood pressure) 10/17/2014   Arthritis, degenerative 10/17/2014   Adult hypothyroidism 10/17/2014   UTI (urinary tract infection) 06/03/2014   Fracture of bone adjacent to prosthesis 06/03/2014   Diabetes ([place]) 06/02/2014   Peri-prosthetic fracture of femur following total hip arthroplasty 06/02/2014   Bladder retention 05/13/2014   Chronic pain 05/10/2014   History of hip surgery 05/10/2014   UNSPECIFIED HEART FAILURE 06/24/2010   UNSPECIFIED CONGENITAL DEFECT OF SEPTAL CLOSURE 06/24/2010   TOBACCO ABUSE 03/18/2010   Secondary cardiomyopathy ([place]) 03/18/2010   DM 06/13/2009   HYPERTENSION, UNSPECIFIED 06/13/2009   VENTRICULAR TACHYCARDIA 06/13/2009   VENTRICULAR SEPTAL DEFECT, CONGENITAL 06/13/2009   Diabetes mellitus, type 2 ([place]) 06/13/2009   Essential (primary) hypertension 06/13/2009    Behavioral Observation/Mental Status:   JAYDEE DACHEL  presents as a 67 y.o.-year-old Right handed Caucasian Female who appeared her stated age. her dress was Appropriate and she was Well Groomed and her manners were Appropriate to the situation.  her participation was indicative of Inattentive and Redirectable behaviors.  There were physical disabilities noted.  she displayed an appropriate level of cooperation and motivation.    Interactions:    Active Inattentive  Attention:   abnormal and attention span appeared shorter than expected for age  Memory:   abnormal; global memory impairment noted  Visuo-spatial:   not examined  Speech (Volume):  normal  Speech:   normal; normal  Thought Process:  Circumstantial and Tangential  Circumstantial, Organized, and Tangential  Though Content:  WNL; not suicidal and not homicidal  Orientation:   person and place  Judgment:   Poor  Planning:   Poor  Affect:    Appropriate  Mood:    Euthymic  Insight:   Shallow  Intelligence:   normal  Psychiatric History:  Patient with significant past psychiatric history including  a history of bipolar affective disorder and anxiety/depression that goes back for many years primarily being followed currently by PCP.  Patient has also been followed by neurology because of some mild memory/cognitive impairments for the past several years with a family history of diagnosis of Alzheimer's.  The patient was [facility] at [facility] point [facility] and run through batteries of test that are somewhat predictable Alzheimer's potential and not found to have any genetic markers etc.  Family Med/Psych History:  Family History  Problem Relation Age of Onset   Other Mother        alive   Stroke Father 31       deceased   Dementia Father    Chorea Maternal Grandfather    Dementia Maternal Aunt    Dementia Maternal Aunt    Heart attack Other        multiple uncles have died with myocardial infarction    Impression/DX:   HPI:  Allexis Gelhaus. Farish is a 67 year old female with history of PVT, T2DM, small membranous VSD, depression,  bipolar d/o, mild cognitive impairment,, fall 05/2022 with 3 column unstable fracture through prior scoliosis construct s/p instrumentation with exposed hardware and wound dehiscence. She was admitted on 08/09/2022 for revision of complex lumbar wound, removal of L1-L5 lumbar screws and rods with revision L1-L5 posterior fusion by Dr. [name].    Postop had issues with fatigue cough, hypotension as well as confusion overnight on 01/24. BLE Dopplers ordered for follow-up on 01/22 and was negative for DVT and showed hypoechoic collection in left popliteal fossa.  Foley placed due to ongoing issues with urinary retention.  Due to ongoing confusion and lethargy, antibiotics added due to concerns of UTI and oxycodone dose decreased due to concerns of side effects.  She started developing drainage from her wound on 01/28 as well as watery diarrhea and stool was negative for C. difficile.   She had rising white count to 19.9 with lactic acidosis on 01/31.  She was  started on broad-spectrum antibiotics and continued to be febrile with Tmax 103.  ID consulted for input due to ongoing fevers and recommended changing antibiotics to vancomycin, cefepime and metronidazole as well as repeat CTA chest/CT spine.  Chest CT showed multifocal patchy or base opacities throughout RUL, RML and bilateral lower lobes worrisome for multifocal pneumonia as well as acute/subacute right 9-10 rib Fx and CT lumbar spine showed subacute fracture through L3 vertebral body and increased air-fluid and posterior soft tissues unexpected in setting of recent surgery.  CT head was negative for acute changes.     [name] consulted due to worsening of respiratory status due to sepsis from acute meningitis and asked patient pneumonia and she required intubation on 02/01.  Neurology consulted due to concerns of seizures and EEG showed moderate diffuse encephalopathy.  MRI brain done showing punctate foci of acute ischemia right frontal lobe and diffusion abnormality within the occipital horn of left lateral ventricle with proteinaceous/purulent debris.  MRI spine showed L2-L5 fusion widely patent with decrease in bone marrow edema L3.  Dr. [name] recommended continuing pain and Flagyl for aspiration pneumonia as well as CNS penetration and given hardware exposure, she would require at least 6 weeks of treatment empirically to treat osteomyelitis.  Tracheal aspirate positive for Candida but not felt to be a pathogen in lung per ID.   Blood cultures x 2 negative.  Core track placed for nutritional support. She was briefly extubated on 02/06 but reintubated due to fatigue and increased WOB.  She underwent tracheostomy on 02/06, was extubated to ATC and is tolerating PMV.  Follow-up MRI brain 12/13 showed "dependent diffusion abnormality in occipital horn of left lateral ventricle with ependymal enhancement concerning for ventriculitis". MBS done on 02/12 and patient cleared for regular diet w/thins. She did  have emesis with episode of desaturation and copious secretions on 02/13. She has had multiple voiding trials--last on 02/10 and foley was replaced. Wound VAC changed today and reports of fecal management system in place due to ongoing loose stool and MASD/prevent wound contamination. Trach to be downsized to CFS# 4 today.    PT/OT has been working with patient who is showing participation and progression with therapy. She is able to transfer on LLE with +2 max assist. She continues to be limited by weakness, cognitive deficits requiring increased time to process as well as verbal and tactile cues to follow simple commands, displays poor attention and memory. CIR recommended due to functional decline.   Disposition/Plan:  Patient was oriented and in a generally pleasant mood  state but continued with cognitive difficulties and while improving displaying attention and memory difficulties consistent with her recent encephalopathic status secondary to sepsis and other medical condition during her hospitalization.  Patient's daughters report ongoing improvement.  Patient's mood appeared to be stable and both the patient and her daughters deny that there is been any significant mood shifts during her hospitalization that have severely or negatively impact her capacity to fully engage in therapeutic interventions.  Review of previous cognitive difficulties would be consistent with mild cognitive change although review of history with patient and family does not appear to be particularly consistent with an Alzheimer's type condition even though she has continued to take Aricept.  Family felt that the Aricept was more of a prophylactic strategy because of her memory concerns and family history of Alzheimer's.  Some of the patient's agitated state could be attributed potentially to Aricept but I will leave decisions around this medication to her neurology practice.         Electronically  Signed   _______________________ Ilean Skill, Psy.D. Clinical Neuropsychologist

## 2022-09-15 DIAGNOSIS — K121 Other forms of stomatitis: Secondary | ICD-10-CM

## 2022-09-15 DIAGNOSIS — R519 Headache, unspecified: Secondary | ICD-10-CM

## 2022-09-15 LAB — GLUCOSE, CAPILLARY
Glucose-Capillary: 124 mg/dL — ABNORMAL HIGH (ref 70–99)
Glucose-Capillary: 132 mg/dL — ABNORMAL HIGH (ref 70–99)
Glucose-Capillary: 132 mg/dL — ABNORMAL HIGH (ref 70–99)
Glucose-Capillary: 137 mg/dL — ABNORMAL HIGH (ref 70–99)

## 2022-09-15 LAB — BASIC METABOLIC PANEL
Anion gap: 9 (ref 5–15)
BUN: 8 mg/dL (ref 8–23)
CO2: 23 mmol/L (ref 22–32)
Calcium: 9.2 mg/dL (ref 8.9–10.3)
Chloride: 99 mmol/L (ref 98–111)
Creatinine, Ser: 0.34 mg/dL — ABNORMAL LOW (ref 0.44–1.00)
GFR, Estimated: >60 mL/min (ref >60)
Glucose, Bld: 116 mg/dL — ABNORMAL HIGH (ref 70–99)
Potassium: 4.2 mmol/L (ref 3.5–5.1)
Sodium: 131 mmol/L — ABNORMAL LOW (ref 135–145)

## 2022-09-15 MED ORDER — BUTALBITAL-APAP-CAFFEINE 50-325-40 MG PO TABS
1.0000 | ORAL_TABLET | Freq: Four times a day (QID) | ORAL | Status: DC | PRN
  Administered 2022-09-15 – 2022-10-01 (×7): 1 via ORAL
  Filled 2022-09-15 (×8): qty 1

## 2022-09-15 MED ORDER — BENZOCAINE 10 % MT GEL: Freq: Four times a day (QID) | OROMUCOSAL | Status: DC | PRN

## 2022-09-15 NOTE — Consult Note (Signed)
[facility] Nurse wound follow up Wound type:lumbar surgical wound dehiscence Measurement:obtained monday Wound bed: no change Drainage (amount, consistency, odor) minimal serosanguinous Periwound: healed incision Dressing procedure/placement/frequency: family concerned with drainage on dressing.  Nonintact distal end had not been covered with aquacel and some effluent was noted.  I cleansed and re-dressed wound, filling defect in proximal end.  Will follow at least weekly.  Estrellita Ludwig MSN, RN, FNP-BC CWON Wound, Ostomy, Continence Nurse [facility] [phone] Pager [phone]

## 2022-09-15 NOTE — Progress Notes (Signed)
Trach removed per MD order. Stoma covered with sterile 2x2 guaze & secured with paper tape. Sp02 98%, HR 95, RR 18. Family member at Bedside. Pt given education on how to voice until stoma closes.

## 2022-09-15 NOTE — Progress Notes (Signed)
Physical Therapy Session Note  Patient Details  Name: [name] MRN: [id number] Date of Birth: 1956/02/22  Today's Date: 09/15/2022 PT Individual Time: 1030-1115 PT Individual Time Calculation (min): 45 min   Short Term Goals: Week 2:  PT Short Term Goal 1 (Week 2): Pt will perform bed mobility with ModA +1. PT Short Term Goal 2 (Week 2): Pt will perform sit<>stand with MaxA +1. PT Short Term Goal 3 (Week 2): Pt will perform seat-to-seat transfers with MaxA +1 PT Short Term Goal 4 (Week 2): Pt with perform standing balance with alternating bil/single UE support for 1-2 minutes. PT Short Term Goal 5 (Week 2): Pt will initiate pre-gait training.  Skilled Therapeutic Interventions/Progress Updates: Pt presents semi-reclined in bed asleep but arouses quickly.  Pt reluctantly agreeable to therapy w/ son's encouragement.  Pt performs log roll to R w/ Mod A and assist to flex L knee.  Pt then transfers to sitting w/ mod A and then scooting to EOB.  Pt somewhat resistive to participation and sequencing for scooting to EOB.  Pt states some SOB (trying to defer mobility?), but O2 sats remained > 97% on RA w/ recently removed trach.  Pt sits EOB w/o UE support although c/o back pain unrated and slumps to right.  Pt transfers sit to stand w/ max A and RW, bt unable to transfer bed > w/c.  Pt returned to sitting EOB.  Pt transfers sit to stand w/ Max A and then SPT to TIS.  Pt requires verbal and tactile cues for scooting back into w/c.  Pt reclined back for comfort.  Pt performed seated chest/triceps press, lat row w/ 1 # weighted bar and manual resistance 3 x 10.  Pt requires encouragement to participate 2/2 c/o pain.  Pt and son educated on tilting w/c for comfort and will be taken out of room for grounds pass.  Care handed off to RN for pain meds.     Therapy Documentation Precautions:  Precautions Precautions: Fall, Back Precaution Comments: trach, rectal tube, foley Required Braces or  Orthoses: Other Brace Other Brace: prevlon boots Restrictions Weight Bearing Restrictions: No Other Position/Activity Restrictions: external rotation R hip at baseline at all times, RLE shorter than LLE, pt ambulated household distances PTA, w/c for community General:   Vital Signs: Therapy Vitals Pulse Rate: 95 Resp: 18 Patient Position (if appropriate): Lying Oxygen Therapy SpO2: 98 % O2 Device: Room Air Pain:unrated pain, but constant.       Therapy/Group: Individual Therapy  Ladoris Gene 09/15/2022, 11:15 AM

## 2022-09-15 NOTE — Progress Notes (Signed)
PROGRESS NOTE   Subjective/Complaints:  Pt has intermittent HA often get first getting up and asks for something different than tylenol.  Reports pain from  aphthous ulcer in mouth. Denies any shortness of breath or respiratory issues. We discussed removing trach today- She is happy about this plan.    ROS:  +HA- intermittent +LE swelling-improving. Denies fevers, chills, Malaise,  cough, CP, SOB, abd pain, N/V/C, new/worsening paresthesias/weakness, vision changes or any other complaints at this time.    Objective:   No results found. Recent Labs    09/13/22 0340  WBC 7.0  HGB 10.0*  HCT 30.8*  PLT 406*     Recent Labs    09/13/22 0340 09/15/22 0439  NA 131* 131*  K 4.2 4.2  CL 100 99  CO2 22 23  GLUCOSE 114* 116*  BUN 6* 8  CREATININE 0.31* 0.34*  CALCIUM 8.9 9.2     Intake/Output Summary (Last 24 hours) at 09/15/2022 0837 Last data filed at 09/14/2022 2014 Gross per 24 hour  Intake 716 ml  Output 2450 ml  Net -1734 ml         Physical Exam: Vital Signs Blood pressure (!) 156/85, pulse 100, temperature 98.5 F (36.9 C), temperature source Oral, resp. rate 18, height '5\' 1"'$  (1.549 m), weight 67.7 kg, SpO2 96 %.  Constitutional:  thin, NAD, sitting at edge of bed HENT: /AT,  MMM  Eyes: EOMI, conjunctiva clear, PERRL Neck:     Comments: #6 trach in place. Excellent phonation with trach capped >72 hours Cardiovascular: RRR, soft murmur heard (VSD), 1-2+ BLE swelling-slightly improved in LLE from yesterday Pulmonary: CTAB no w/r/r, no increased WOB Abdominal: Soft, nontender, nondistended, positive bowel sounds/normal active Genitourinary:    Comments:Sacral wound dressing clean  foley in place-yellow urine Neurological: Follows commands, memory- decreased at times,  Alert and oriented x3, no hypertonia noted  PRIOR EXAM: Musculoskeletal:        General: Swelling present.     Cervical back:  Normal range of motion.     Comments: Right leg discrepancy with weakness and leg rotated outward. Dependent edema Right forearm and hand and left forearm.   Skin:    Comments: Well healed old right thigh incision. Wound VAC- has been removed midline back dressing dry and clean, low back  Neurological:     Mental Status: She is alert and oriented to person, place, and time.     Comments: Alert and oriented x 3 (needed some help with day) much improved insight and awareness. [place] Memory. Normal language and speech. Cranial nerve exam unremarkable. UE 4/5 prox to distal. RLE 2-/5 prox to 4/5 distally. LLE 2+/5 prox to 4/5 distally. Senses pain in all 4 limbs. No abnl resting tone.   Psychiatric:     Appears a little anxious  Assessment/Plan: 1. Functional deficits which require 3+ hours per day of interdisciplinary therapy in a comprehensive inpatient rehab setting. Physiatrist is providing close team supervision and 24 hour management of active medical problems listed below. Physiatrist and rehab team continue to assess barriers to discharge/monitor patient progress toward functional and medical goals  Care Tool:  Bathing  Bathing activity did not  occur: Refused           Bathing assist       Upper Body Dressing/Undressing Upper body dressing   What is the patient wearing?: [place] only    Upper body assist Assist Level: Total Assistance - Patient < 25%    Lower Body Dressing/Undressing Lower body dressing      What is the patient wearing?: Hospital gown only     Lower body assist Assist for lower body dressing: Total Assistance - Patient < 25%     Toileting Toileting Toileting Activity did not occur Landscape architect and hygiene only): N/A (no void or bm) (using catheter and rectal tube)  Toileting assist Assist for toileting: Dependent - Patient 0%     Transfers Chair/bed transfer  Transfers assist  Chair/bed transfer activity did not occur:  Safety/medical concerns  Chair/bed transfer assist level: 2 Helpers     Locomotion Ambulation   Ambulation assist   Ambulation activity did not occur: Safety/medical concerns          Walk 10 feet activity   Assist  Walk 10 feet activity did not occur: Safety/medical concerns        Walk 50 feet activity   Assist Walk 50 feet with 2 turns activity did not occur: Safety/medical concerns         Walk 150 feet activity   Assist Walk 150 feet activity did not occur: Safety/medical concerns         Walk 10 feet on uneven surface  activity   Assist Walk 10 feet on uneven surfaces activity did not occur: Safety/medical concerns         Wheelchair     Assist Is the patient using a wheelchair?: Yes Type of Wheelchair: Manual Wheelchair activity did not occur: Safety/medical concerns         Wheelchair 50 feet with 2 turns activity    Assist    Wheelchair 50 feet with 2 turns activity did not occur: Safety/medical concerns       Wheelchair 150 feet activity     Assist  Wheelchair 150 feet activity did not occur: Safety/medical concerns       Blood pressure (!) 156/85, pulse 100, temperature 98.5 F (36.9 C), temperature source Oral, resp. rate 18, height '5\' 1"'$  (1.549 m), weight 67.7 kg, SpO2 96 %.  Medical Problem List and Plan: 1. Functional deficits secondary to osteomyelitis,infection of lumbar surgical site complicated by meningitis/ventriculitis.             -pt also with significant deconditioning             -patient may not yet shower             -ELOS/Goals: 20-27 days, Min/Mod A with PT, OT, SLP  -Continue CIR, PT OT SL  -Team conference today please see physician documentation under team conference tab, met with team  to discuss problems,progress, and goals. Formulized individual treatment plan based on medical history, underlying problem and comorbidities.     2.  Antithrombotics: -DVT/anticoagulation:   Pharmaceutical: Lovenox '40mg'$  QD             -antiplatelet therapy: N/a 3. Pain Management: Butrans 62mg/hr patch for pain control w/ Flexeril '5mg'$  TID, Tylenol PRN, Oxycodone 7.'5mg'$  q4h PRN, Gabapentin '100mg'$  TID  -09/04/22 increased flexeril to 7.'5mg'$  TID -09/05/22 schedule Oxycodone '5mg'$  q6h and leave 2.'5mg'$  q4h PRN dosing -2/26 tylenol PRN for HA -2/28 Fioricet ordered PRN headache  4. Mood/Behavior/Sleep: LCSW  to follow for evaluation and support.             --Trazodone '100mg'$  QHS and melatonin '5mg'$  QHS PRN for  insomnia/mood  -09/04/22 placed sign on door to minimize interruptions 11p-5a; changed CBG checks as below to minimize interruptions             -antipsychotic agents:  Luvox '100mg'$  QHS and Lamictal '200mg'$  QD  -Continue Aricept '10mg'$  QD  -Seen by neuropsych-appreciate assistance 5. Neuropsych/cognition: This patient is not fully capable of making decisions on her own behalf. 6. Skin/Wound Care: WOC to follow for wound VAC changes --MASD managed by fecal system-- flexiseal clogged 09/04/22 AM but nursing states it's now working; monitor--might be able to d/c soon? -2/21 WOC stopped VAC, start aquacel and dressing -see flexiseal discussion below, #17 -2/27 WOC following, slough pulled away revealing 2cm deep defect 7. Fluids/Electrolytes/Nutrition: Monitor I/O. Continue tube feeds-->likely contributing to diarrhea             -monitor intake closely, continue vitamins -2/19 discontinue core track Consult dietician -2/27 decrease BMP to every other day  8.Sepsis w/ osteomyelitis/meningitis/encephalitis: Continue Cefepime 2g q8h, Vanc '1250mg'$  q12h, and Flagyl '500mg'$  q8h for at least 6 weeks             --end date per ID. Will follow up on plan during admit  -pharmacy following for Vanc dosing- appreciate- appears checking today  9. H/o PVT:  Monitor BP/heart rate TID--on coreg 12.'5mg'$  BID, Labetaolol '10mg'$  q2h PRN  -09/04/22 BP/HR fairly well controlled, cont regimen, monitor  -2/23  BP has been  a little above goal, increase coreg dose to '25mg'$  BID  -2/27 fairly well controlled overall     09/15/2022    5:00 AM 09/14/2022    8:14 PM 09/14/2022    4:57 PM  Vitals with BMI  Weight 149 lbs 4 oz    BMI [id number]    Systolic  [id number]   Diastolic  85   Pulse  [id number] 90      10. Urinary retention: Has failed 4 voiding trails. Has had foley since 12/23             -maintain foley cath, foley mgt per protocol -- Continue Flomax 0.'4mg'$  qPM. Will d/c urecholine.   11. VDRF s/p trach: Tolerating PMSV well -I ordered downsizing to CFS # 4 today--tolerated well -continue ATC w/ pulmonary hygiene -begin day time plugging of trach tomorrow. Decannulate soon. -Trial of capping with respiratory-  can trial 24 hour capping today -2/22 Leave trach uncapped tonight -2/28 plan for decannulation today, has had trach capped for >72hours  12. T2DM: Monitor BS ac/hs and use SSI for elevated BS.              --continue tube feeds with supplements between meals due to poor intake             --continue liberalized diet for now. -09/04/22 CBGs looking great, Changed SSI and CBG checks to TID AC + QHS to minimize interruptions overnight, since tube feeds are decreased/temporarily stopped currently -2/28 CBGS well controlled   CBG (last 3)  Recent Labs    09/14/22 1624 09/14/22 2015 09/15/22 0645  GLUCAP 78 169* 124*     13. Hypokalemia: Recurrent likely due to diarrhea/intake.              --will add standing dose BID -->recheck labs on 02/17 and 02/19  -09/04/22 K+ 4.0, continue Klor 45mq BID, monitor on weekly labs -09/05/22  doesn't like Klor packet but no great alternative (Phos-Nak would have to be heavily dosed to get equivalent K+, so only other alternative is IV-- will hold off for now) -2/19 Increase K+ supplement to 4mq BID, could consider change to tablet however this is a large tablet and she had trouble swallowing it previously so continue packet for now -2/20, discussed with pharmacy,  change to tablet potassium, IV K+ ordered , daily labs -2/23 K+ down to 3.4, increase K+ supplement 231m from BID to TID -2/26 stable at 4.2 14. Anemia of critical illness: Hgb 7.5-8.9 range -2/26 stable at HGB 10 15. Hyponatremia/hypochloremia: Improving 125/87-->130/94.  -09/04/22 Na 129, Cl 95, both stable, continue salt tabs 1g BID -Cortisol testing done during hospitalization (13.8 on 08/30/22), was normal, but daughter and pt want to make sure this is followed up--defer to weekday team regarding ongoing ?Addison's testing -2/21 129, a little decreased, fluid restriction started -2/26 stable at 131 16. Encephalopathy/Delirium: Continues to have intermittent confusion--but much improved compared to 3 days ago per family.  --Continue delirium precautions. 17. Loose stools likely d/t TF and abx.             -reducing TF as above.  -flexiseal clogged 09/04/22 AM but nursing states it's now working; monitor--might be able to d/c soon? -add probiotics and fiber-- fiber changed to NuHur[address]er RD on 09/04/22 since this should cause less clogging of Cortrak -Lomotil 1-2 tabs QID PRN -2/21 improved, rectal tube to help keep wound clean -2/22 continue rectal tube for now -09/11/22 rectal tube dislodged, stools clogging it d/t more formed, d/c for now and see how she does with bedpan, if soiling near wound then needs replacement of flexiseal -09/12/22 stools type 5-6, not super frequent, using diaper not bedpan-- looks like no stool is getting close to wound, but advised trying bedpan. Advised nursing that if stool becomes more frequent or more type 6-7, then flexiseal would need to be replaced. Monitor closely.  -2/27 having regular daily Bms  18. Hypothyroidism: continue synthroid [id number] 19. Chronic combined CHF, BLE Swelling: recent 2D echo done 08/20/2022 showed apical hypokinesis, LVEF 40-45%, RV function was reduced. Intermittently diuresed during hospitalization, most recently given '40mg'$  IV  lasix on 09/03/22 -09/04/22 noted to still have BLE swelling, start '20mg'$  PO lasix and monitor lytes/BP/swelling -2/23 stable, continue to monitor -09/11/22 wt up a little, but swelling fairly stable; encouraged TED hose use during day -2/27 wt a little up today, monitor trend Filed Weights   09/13/22 0500 09/14/22 0634 09/15/22 0500  Weight: 66.9 kg 66.7 kg 67.7 kg    20. Oral ulcer  --2/28 Orajel for oral ulcer discomfort  LOS: 12 days A FACE TO FACE EVALUATION WAS PERFORMED  YurJennye Boroughs28/2024, 8:37 AM

## 2022-09-15 NOTE — Progress Notes (Signed)
Occupational Therapy Session Note  Patient Details  Name: [name] MRN: [id number] Date of Birth: [birth year]  Today's Date: 09/15/2022 OT Individual Time: [id number] OT Individual Time Calculation (min): 59 min  and Today's Date: 09/15/2022 OT Missed Time: 15 Minutes Missed Time Reason: Patient unavailable (wound care, IV team, respiratory all entered room)   Short Term Goals: Week 2:  OT Short Term Goal 1 (Week 2): Pt will complete sit > stand from EOB in stedy with mod +2 A OT Short Term Goal 2 (Week 2): Pt will maintain dynamic sitting balance with (S) for entire ADL routine EOB OT Short Term Goal 3 (Week 2): Pt will bridge in bed with mod A for reducing caregiver burden  Skilled Therapeutic Interventions/Progress Updates:    Pt received supine with no c/o pain, agreeable to OT session. Pt much more pleasant overall this session. Son present. She completed bed mobility with mod A, OT facilitating log roll technique. She stood with heavy max A of 1. Stand pivot with max-total A of one with 2nd person present for safety and to manage lines/equipment. All vitals stable with trach capped during session. She voided BM seated on the bariatric BSC. Pillows used posteriorly for back support and pain relief. Total A for hygiene seated. Max A stand pivot to the EOB per pt request. She sat EOB for 10 min with close supervision. She requested to go back to supine d/t increasing back pain. She required max A. Wound care RN and then IV nurse entered room, 15 min missed. From bed level pt completed 1x8 chest press and forward raise with a 3lb dowel to increase BUE strength needed to push from EOB to stand. At this point respiratory therapy also entered to de-cannulate trach. Pt left supine with all needs met.   Therapy Documentation Precautions:  Precautions Precautions: Fall, Back Precaution Comments: trach, rectal tube, foley Required Braces or Orthoses: Other Brace Other Brace: prevlon  boots Restrictions Weight Bearing Restrictions: No Other Position/Activity Restrictions: external rotation R hip at baseline at all times, RLE shorter than LLE, pt ambulated household distances PTA, w/c for community   Therapy/Group: Individual Therapy  Curtis Sites 09/15/2022, 6:13 AM

## 2022-09-15 NOTE — Progress Notes (Addendum)
Speech Language Pathology Daily Session Note  Patient Details  Name: [name] MRN: [id number] Date of Birth: 1956/01/03  Today's Date: 09/15/2022 SLP Individual Time: 1300-1400 SLP Individual Time Calculation (min): 60 min  Short Term Goals: Week 2: SLP Short Term Goal 1 (Week 2): Pt will use external memory aids to recall important information with min-to-mod A verbal cues. SLP Short Term Goal 2 (Week 2): Pt will recall 2 areas of cognitive impairment with min-to-modA verbal cues. SLP Short Term Goal 3 (Week 2): Pt will participate in therapeutic task for 20 minutes with min-to-modA verbal cues. SLP Short Term Goal 4 (Week 2): Patient will complete functional and semi-complex problem solving tasks with min-to-mod A verbal cues  Skilled Therapeutic Interventions: Skilled ST treatment focused on cognitive goals. Pt was greeted semi reclined in bed on arrival and accompanied by daughter and spouse. Pt was in good spirits, especially after trach was decannulated earlier. Vocal quality and intensity WFL with only minimal air leakage through stoma.   SLP facilitated a mildly complex calendar organization task in which pt wrote down important appointments, events, and bill due dates on to a monthly calendar with overall mod A verbal cues for sustained attention, alternating attention between list of appointments and written calendar, and working memory. Pt completed problem solving with sup-to-min A verbal cues. Pt demonstrated signs of cognitive fatigue as task progressed. Was able to sustain attention for approximately 25 minutes with min A verbal redirection cues.   Pt received a call from a friend during session. Pt requested for spouse to remind her to call friend back later. SLP utilized this as an opportunity to implement use of compensatory memory strategies to increase independence. Pt wrote down reminder in memory notebook with sup A verbal cues for effectiveness and reported written  notes is her preferred method rather than using technology. SLP educated spouse on ways to support carry over of memory compensations.   Pt engaged in a functional discussion regarding memory deficits and exhibited improved insight and acceptance of cognitive deficits. Pt stated it has been helpful to better understand that there is a reason for the change in cognition and she is actively trying to give herself more grace and understanding when experiencing difficulty. Patient was left in bed with alarm activated and immediate needs within reach at end of session. Continue per current plan of care.      Pain Pain Assessment Pain Scale: 0-10 Pain Score: 2   Therapy/Group: Individual Therapy  [name] 09/15/2022, 2:01 PM

## 2022-09-15 NOTE — Progress Notes (Signed)
Occupational Therapy Session Note  Patient Details  Name: [name] MRN: [id number] Date of Birth: 1955/11/07  Today's Date: 09/15/2022 OT Individual Time: [id number] OT Individual Time Calculation (min): 40 min   Short Term Goals: Week 2:  OT Short Term Goal 1 (Week 2): Pt will complete sit > stand from EOB in stedy with mod +2 A OT Short Term Goal 2 (Week 2): Pt will maintain dynamic sitting balance with (S) for entire ADL routine EOB OT Short Term Goal 3 (Week 2): Pt will bridge in bed with mod A for reducing caregiver burden  Skilled Therapeutic Interventions/Progress Updates:  Pt received resting in bed for skilled OT session with focus on sitting tolerance, general conditioning, and bed mobility. Pt agreeable to interventions, despite demonstrating overall irritable mood. Pt with un-rated pain, stating "you guys just don't stop coming, do you?" in reference to perceived heaviness of therapy schedule. OT offering intermediate rest breaks and positioning suggestions throughout session to address pain/fatigue and maximize participation/safety in session.   Pt performs supine>sit EOB with Mod A + multimodal cuing for log roll technique. Sitting EOB, pt participates in UE/core strengthening exercises including: -Chest press -Bicep curls -Overhead press -Gross finger flexion-red resistive clip  Pt performs 1 set/8-10 reps of each exercises with weighted medicine ball and 1 # dowel bar, requiring multimodal cuing for form. Pt tolerates ~8-10 mins of sitting EOB with feet supported. Pt then requesting to return to supine due to back pain.   In supine patient requires A to participate in BLE ROM/strengthening exercises including: -Hip flexion/extension  -Hip abduction/adduction Pt able to perform ankle pumps independently with cuing for encouragement.   Pt requires increased time to initiate/perform R/L rolling in bed for positioning, needing mod-max A to complete due to  fatigue/pain.   Pt remained resting in bed with all immediate needs met at end of session. Pt continues to be appropriate for skilled OT intervention to promote further functional independence.   Therapy Documentation Precautions:  Precautions Precautions: Fall, Back Precaution Comments: trach, rectal tube, foley Required Braces or Orthoses: Other Brace Other Brace: prevlon boots Restrictions Weight Bearing Restrictions: No Other Position/Activity Restrictions: external rotation R hip at baseline at all times, RLE shorter than LLE, pt ambulated household distances PTA, w/c for community   Therapy/Group: Individual Therapy  Maudie Mercury, OTR/L, MSOT  09/15/2022, 6:10 AM

## 2022-09-15 NOTE — Patient Care Conference (Signed)
Inpatient RehabilitationTeam Conference and Plan of Care Update Date: 09/15/2022   Time: 12:02 PM    Patient Name: [name]      Medical Record Number: [id number]  Date of Birth: [birth year] Sex: Female         Room/Bed: 4W14C/4W14C-01 Payor Info: Payor: [place] / Plan: BC[employer] MEDICARE / Product Type: *No Product type* /    Admit Date/Time:  09/03/2022  6:18 PM  Primary Diagnosis:  Infection of lumbar spine [facility])  Hospital Problems: Principal Problem:   Infection of lumbar spine ([place]) Active Problems:   VENTRICULAR SEPTAL DEFECT, CONGENITAL   Anxiety   Diabetes mellitus, type 2 (HCC)   Acid reflux   Mild cognitive impairment   Mood disorder ([place])   Wound dehiscence   Encephalopathy   Malnutrition of moderate degree   Bacterial meningitis   Hardware complicating wound infection ([place])   Acute blood loss anemia   Chronic combined systolic and diastolic CHF (congestive heart failure) [facility])    Expected Discharge Date: Expected Discharge Date: 10/06/22  Team Members Present: Physician leading conference: Dr. Jennye Boroughs Social Worker Present: Ovidio Kin, LCSW Nurse Present: Dorien Chihuahua, RN PT Present: Becky Sax, PT OT Present: Laverle Hobby, OT SLP Present: Weston Anna, SLP     Current Status/Progress Goal Weekly Team Focus  Bowel/Bladder   Indewling foley, Incontinent with some continent episodes of bowel.   Regain full continence of bowel.   continue foley care, and assist with toileting as needed    Swallow/Nutrition/ Hydration   reg/thin diet - goal met   independent  N/A    ADL's   Mod A UB ADLs, max +2 LB ADLs, (S) sitting balance, max A to stand, Max +A to pivot   min-mod A overall   ADLs, transfers, standing balance, strengthening, participation, d/c planning    Mobility   mod - maxA bed mobiltiy (depending on fatigue and motivation levels), sitting balance has significantly improved up to close supervision  however can fatigue easily and demosntrates an increased lateral lean but posterior LOB has not been evident. Sit to/from stand maxA 1-2. Has performed stand pivot transfer with RW and maxA x 2. At times cognition appears to limit initation   min/modA overall  standing, transfers, pre-gait, family education    Communication                Safety/Cognition/ Behavioral Observations  mod A   min A   intellectual awareness, sustained attention, memory with use of compensations, problem solving, pt/family education    Pain   C/O back pain 8/10, PRN medications and scheduled meds.   Pain <3/10   Monitor for effectiveness and re-evaluate as needed    Skin   Capped trach, Surgical incision with daily dressing changes, abdomenal bruising.   Promote healing and prevention of infection.  Assess Qshift and prn      Discharge Planning:  Family here is daily and oserves in therapies, aware of amount of care she is and hopeful she will make progress while here.   Team Discussion: Patient making progress; slow forward trajectory.  Foley placed 09/07/22 scheduled for replacement 10/06/22. PICC for IV abx through 09/30/22. Limited by fatigue; bowels are more normal, wound is healing and CBGs are controlled. Trach removed 09/15/22.   Patient on target to meet rehab goals: yes, currently needs mod assist for upper body care and max assist for loer body care. Sitting balance has improved,   *See Care  Plan and progress notes for long and short-term goals.   Revisions to Treatment Plan:  Neuropsych consult   Teaching Needs: Safety, wound care, skin care, foley maintenance, transfers, toileting, etc.   Current Barriers to Discharge: Decreased caregiver support, Home enviroment access/layout, Wound care, and Behavior  Possible Resolutions to Barriers: Family education HH follow up services DME; TBD     Medical Summary Current Status: osteomyelitis,infection of lumbar surgical site  complicated by meningitis/ventriculitis, oral ulcer, HA, CHF, wound, T2DM  Barriers to Discharge: Behavior/Mood;Complicated Wound;Electrolyte abnormality;Infection/IV Antibiotics;Trach  Barriers to Discharge Comments: osteomyelitis,infection of lumbar surgical site complicated by meningitis/ventriculitis, oral ulcer, HA, CHF, wound, T2DM Possible Resolutions to [employer] Focus: Remove trach, Fioricet,Orajel, wound care   Continued Need for Acute Rehabilitation Level of Care: The patient requires daily medical management by a physician with specialized training in physical medicine and rehabilitation for the following reasons: Direction of a multidisciplinary physical rehabilitation program to maximize functional independence : Yes Medical management of patient stability for increased activity during participation in an intensive rehabilitation regime.: Yes Analysis of laboratory values and/or radiology reports with any subsequent need for medication adjustment and/or medical intervention. : Yes   I attest that I was present, lead the team conference, and concur with the assessment and plan of the team.   Margarito Liner 09/15/2022, 2:08 PM

## 2022-09-16 ENCOUNTER — Inpatient Hospital Stay: Payer: Medicare Other | Admitting: Infectious Disease

## 2022-09-16 DIAGNOSIS — T847XXD Infection and inflammatory reaction due to other internal orthopedic prosthetic devices, implants and grafts, subsequent encounter: Secondary | ICD-10-CM

## 2022-09-16 LAB — GLUCOSE, CAPILLARY
Glucose-Capillary: 123 mg/dL — ABNORMAL HIGH (ref 70–99)
Glucose-Capillary: 148 mg/dL — ABNORMAL HIGH (ref 70–99)
Glucose-Capillary: 89 mg/dL (ref 70–99)
Glucose-Capillary: 188 mg/dL — ABNORMAL HIGH (ref 70–99)

## 2022-09-16 MED ORDER — NUTRISOURCE FIBER PO PACK: 1.0000 | PACK | Freq: Two times a day (BID) | ORAL | Status: DC

## 2022-09-16 NOTE — Progress Notes (Signed)
Occupational Therapy Session Note  Patient Details  Name: [name] MRN: [id number] Date of Birth: Dec 31, 1955  Today's Date: 09/16/2022 OT Individual Time: 1115-1200 OT Individual Time Calculation (min): 45 min    Short Term Goals: Week 2:  OT Short Term Goal 1 (Week 2): Pt will complete sit > stand from EOB in stedy with mod +2 A OT Short Term Goal 2 (Week 2): Pt will maintain dynamic sitting balance with (S) for entire ADL routine EOB OT Short Term Goal 3 (Week 2): Pt will bridge in bed with mod A for reducing caregiver burden  Skilled Therapeutic Interventions/Progress Updates:   Pt seen for skilled OT session just prior to lunch meal. Husband bedside, pt bed level and Elonda Husky TT present for + 2 assist with OT. Pt reported she needed to use the commode upon OT arrival. Pt reported 6/10 pain in back but had meds recently. Pt required max A x1 min A x 1 for supine to EOB due to difficulty reaching for rails for rolling and stiffness with tendency to resist forward trunk flexion. Pt did just get back to bed from OOB in [place] already this am. OT and TT assisted with same level of assist for sit to stand with RW from elevated bed surface. Took 2-3 small shuffling steps toward L side with deterioration in standing quality prior to sitting on wide BSC.OT managed Foley and IV lines. Pt was able to have BM and required total A for buttocks hygiene. Transfer to R side via lateral scoot with max A x 1 min A x 1 to EOB. Due to commode positioned too far to foot of bed, pt was then on lower 1/3 of bed seated. OT worked with pt to demo and mirror lateral weight shifts/scoots and pt unable x 10 attempts with at best 4" lateral mobility. Pt then assisted by therapy duo to come to sidelying then LE's up on bed with max a and D scoot up to [facility]. Pt was able to roll side to side with min-mod a using rails for incontinence brief donning and adjustment. Pt able to manage bed controls for HOB ~ 50 degrees for  initiating lunch meal. Left pt bed level with bed exit engaged, nurse call button, needs and husband bedside.   Therapy Documentation Precautions:  Precautions Precautions: Fall, Back Precaution Comments: trach, rectal tube, foley Required Braces or Orthoses: Other Brace Other Brace: prevlon boots Restrictions Weight Bearing Restrictions: No Other Position/Activity Restrictions: external rotation R hip at baseline at all times, RLE shorter than LLE, pt ambulated household distances PTA, w/c for community   Therapy/Group: Individual Therapy  Barnabas Lister 09/16/2022, 7:47 AM

## 2022-09-16 NOTE — Progress Notes (Signed)
Speech Language Pathology Daily Session Note  Patient Details  Name: [name] MRN: [id number] Date of Birth: 12/23/1955  Today's Date: 09/16/2022 SLP Individual Time: 1330-1430 SLP Individual Time Calculation (min): 60 min  Short Term Goals: Week 2: SLP Short Term Goal 1 (Week 2): Pt will use external memory aids to recall important information with min-to-mod A verbal cues. SLP Short Term Goal 2 (Week 2): Pt will recall 2 areas of cognitive impairment with min-to-modA verbal cues. SLP Short Term Goal 3 (Week 2): Pt will participate in therapeutic task for 20 minutes with min-to-modA verbal cues. SLP Short Term Goal 4 (Week 2): Patient will complete functional and semi-complex problem solving tasks with min-to-mod A verbal cues  Skilled Therapeutic Interventions: Skilled ST treatment focused on cognitive goals. Pt presented in bed with daughter present and initially reluctant to participate. Pt eventually agreeable to transfer from bed to TIS with max A x2. Pt transported down to ortho gym for use of BITS to participate in tasks with emphasis on sustained attention, working memory, sequencing, and mildly complex problem solving with sequential memory task, trail making, and letter/word/number identification tasks. Pt completed with min-to-mod A verbal and visual cues with sustained attention duration for 5 minute intervals with min A verbal redirection cues. Accuracy on memory tasks ranged from 55% accuracy to 75% given mod A verbal/visual cues. Accuracy improved with shorter duration likely d/t nature of decreased cognitive load. Pt benefited from intermittent breaks between tasks and engagement from daughter which appeared to improve her motivation and willingness to participate in tasks. Patient was left in TIS with alarm activated and immediate needs within reach at end of session. Dtr at bedside. Continue per current plan of care.      Pain  None at rest.   Therapy/Group:  Individual Therapy  [name] 09/16/2022, 4:03 PM

## 2022-09-16 NOTE — Progress Notes (Signed)
Physical Therapy Session Note  Patient Details  Name: [name] MRN: [id number] Date of Birth: 17-Oct-1955  Today's Date: 09/16/2022 PT Individual Time: 330-440-0211 and 1440-1505 PT Individual Time Calculation (min): 65 min and 25 min  Short Term Goals: Week 2:  PT Short Term Goal 1 (Week 2): Pt will perform bed mobility with ModA +1. PT Short Term Goal 2 (Week 2): Pt will perform sit<>stand with MaxA +1. PT Short Term Goal 3 (Week 2): Pt will perform seat-to-seat transfers with MaxA +1 PT Short Term Goal 4 (Week 2): Pt with perform standing balance with alternating bil/single UE support for 1-2 minutes. PT Short Term Goal 5 (Week 2): Pt will initiate pre-gait training.  Skilled Therapeutic Interventions/Progress Updates: Pt presented in bed with husband present and reluctantly agreeable to therapy. Pt c/o pain in back, did not rate but pt stating generally 8/10. Rest breaks and repositioning provided during session. RN present completing changing lines to IV. Pt performed roll to R with modA and performed sit to supine from sidelying with modA. Pt encouraged to work on scooting to EOB however was unable to general enough power through BUE and would not perform lateral leans to offload hip to facilitate anterior scoot therefore PTA performed scoot to EOB max to total A. Pt then made several attempts from elevated bed with RW to stand. On third attempt with maxA x 2 pt was able to stand and performed step pivot transfer to TIS. Pt with absent safety awareness and begins sitting in chair prior to be safely aligned. Once pt sitting in TIS pt did initiate attempt to scoot anteriorly. Ultimately required total A to scoot posteriorly and to improve alignment in TIS. Pt then transported to day room and participated in Cybex Kinetron 90cm/sec 3 cycles x 10 with PTA providing assist to increase range however pt did demonstrate improved initiation in attempting to push down pedals. Pt then transported  back to room and performed step pivot transfer in same manner as prior to EOB with pt able to take larger steps this time. At EOB pt participated in [place], hip er with yellow theraband, hamstring pulls with yellow threaband, and chest press with 2lb dowel all performed x 10 bilaterally. Pt then requesting to return to supine. Performed sit to sidelying with use of bed rail and PTA provided total A to bring BLE onto bed. Pt required total A x 2 to boost to [facility]. Pt performed rolling L/r with modA remove second gown and adjust pad. Pt then performed manually resisted leg press, SAQ x 10 bilaterally. Pt left resting in bed at end of session with bed alarm on, call bell within reach and needs met.   Tx2: Pt presented in TIS with dgt present, expressing need for [facility] use. +2 assistance obtained and performed stand with maxA with use of RW and pt able to initiate first step but unable to adequately align with [facility] therefore BSC moved to pt. Pt was unsuccessful sitting at [facility] due to fatigue levels set up with [name] and required maxA x 2 to stand to Circle to transfer to bedside. Pt able to stand with from perch with maxA and required heavy maxA to provide controlled descent to bed. Pt required maxA x 1 for safe sitting for supine transfer to maintain spinal precautions. Pt was transferred to supine with maxA and performed rolling L/R with light modA to allow placement of brief. Performed dependent x 2 boost to [facility] and left with call bell  within reach and needs met.      Therapy Documentation Precautions:  Precautions Precautions: Fall, Back Precaution Comments: trach, rectal tube, foley Required Braces or Orthoses: Other Brace Other Brace: prevlon boots Restrictions Weight Bearing Restrictions: No Other Position/Activity Restrictions: external rotation R hip at baseline at all times, RLE shorter than LLE, pt ambulated household distances PTA, w/c for community General: PT Amount of Missed Time (min): 10  Minutes PT Missed Treatment Reason: Other (Comment) Vital Signs:   Pain: Pain Assessment Pain Scale: 0-10 Pain Score: 4  Mobility:   Locomotion :    Trunk/Postural Assessment :    Balance:   Exercises:   Other Treatments:      Therapy/Group: Individual Therapy  Deanie Jupiter 09/16/2022, 12:29 PM

## 2022-09-16 NOTE — Progress Notes (Signed)
[facility] for Infectious Disease  Date of Admission:  09/03/2022      Total days of antibiotics 28  Vancomycin 2/1 >> 2/29  Cefepime 2/1 >> 2/29  Metronidazole 2/1 >> 2/29           ASSESSMENT: [name] is a 67 y.o. female s/p L1-L5 fracture stabilization and posterolateral instrumented fusion 06/26/22 , readmitted 12/21-12/1 for wound concerns thought to be non-infectious, complicated by wound dehiscence with exposed hardware requiring wound revision with removal of left L1, L2, L3, L4, and L5 lumbar screws and rod, removal of right L1 screw, revision of L1-L5 posterior instrumented fusion 1/22 with CSF leak s/p repair 1/22.  Started on doxycycline post op, became encephalopathic, added ciprofloxacin which was switched to cefadroxil for concerns of UTI.  Fevers/leukocytosis starting 1/30 with ongoing confusion and escalation of abtx to Vancomycin and zosyn Further complicated by dyspnea and tachycardia, ID/[name] consulted requiring ICU care and intubation>>tracheostomy for support during acute aspiration pneumonia/pneumonitis. MRI brain revealed concern for ventriculitis --> abxt switched to Vancomycin, cefepime and metronidazole for concerns for meningitis + asp PNA.   Repeat MRI 2/12 completed w/o much improvement re: ventriculitis noted but no progression. Clinically on exam today she is significantly improved compared to when I last saw her a few weeks ago. Lurline Idol is de cannulated and she is tolerating rehab/recovery. Long road to go but doing well considering.  I think given resolved pulm issues we can d/c metronidazole. Continue IV Vancomycin + cefepime as previously outlined for 6w course (4 weeks completed today).   Continue weekly CBC and twice weekly BMP with vanc monitoring.  ESR / CRP for therapeutic monitoring now (37/4.0 respectively on 2/1).   Lumbar wound as pictured from 2/26: Still with some dehiscence/non-healing at proximal end noted but  improving slowly - wound care team following weekly.      PLAN: Continue Vancomycin + Cefepime with EOT: 09/30/22 Stop metronidazole  THEN convert to PO levaquin 750 mg QD + Doxycycline 100 mg BID -->separate from any iron / MVI supplements by > 4h please. D/C with 59msupply of both.  FU with ID arranged with Dr. VTommy Medal3/25/24 @ 2:00 pm    ID will sign off - please call back with any questions/concerns or if we can be of further assistance.    Principal Problem:   Infection of lumbar spine ([place] Active Problems:   VENTRICULAR SEPTAL DEFECT, CONGENITAL   Anxiety   Diabetes mellitus, type 2 (HCC)   Acid reflux   Mild cognitive impairment   Mood disorder (HCC)   Wound dehiscence   Encephalopathy   Malnutrition of moderate degree   Bacterial meningitis   Hardware complicating wound infection (HCC)   Acute blood loss anemia   Chronic combined systolic and diastolic CHF (congestive heart failure) (HCC)    buprenorphine  1 patch Transdermal Weekly   carvedilol  25 mg Oral BID WC   Chlorhexidine Gluconate Cloth  6 each Topical Q12H   cyclobenzaprine  7.5 mg Oral TID   donepezil  10 mg Oral Daily   enoxaparin (LOVENOX) injection  40 mg Subcutaneous Q24H   famotidine  20 mg Oral BID   feeding supplement  237 mL Oral TID BM   fiber  1 packet Oral BID   fluvoxaMINE  100 mg Oral QHS   furosemide  20 mg Oral Daily   gabapentin  100 mg Oral TID   [name]'s butt cream  Topical Daily   insulin aspart  0-15 Units Subcutaneous TID WC   insulin aspart  0-5 Units Subcutaneous QHS   lamoTRIgine  200 mg Oral Daily   levothyroxine  88 mcg Oral Q0600   multivitamin with minerals  1 tablet Oral Daily   oxyCODONE  5 mg Oral Q6H   potassium chloride  20 mEq Oral TID   saccharomyces boulardii  250 mg Oral BID   sodium chloride flush  10-40 mL Intracatheter Q12H   sodium chloride  1 g Oral BID WC   tamsulosin  0.4 mg Oral QPC supper   traZODone  100 mg Oral QHS    SUBJECTIVE: [name]  and her husband are enjoying lunch in the room together. Feeling much better. Decannulation of trach site. Tolerating therapy well and recovering. Projected 3 more weeks in CIR planned as of now.    Review of Systems: ROS  No Known Allergies  OBJECTIVE: Vitals:   09/15/22 0951 09/15/22 1402 09/15/22 2032 09/16/22 0636  BP:  (!) 157/95 (!) 153/82 137/74  Pulse: 95 77 84 84  Resp: '18 16 18 20  '$ Temp:  97.8 F (36.6 C) (!) 97.4 F (36.3 C) 98.4 F (36.9 C)  TempSrc:  Oral    SpO2: 98% 100% 100% 98%  Weight:    67.8 kg  Height:       Body mass index is 28.24 kg/m.  Physical Exam  Lab Results Lab Results  Component Value Date   WBC 7.0 09/13/2022   HGB 10.0 (L) 09/13/2022   HCT 30.8 (L) 09/13/2022   MCV 98.7 09/13/2022   PLT 406 (H) 09/13/2022    Lab Results  Component Value Date   CREATININE 0.34 (L) 09/15/2022   BUN 8 09/15/2022   NA 131 (L) 09/15/2022   K 4.2 09/15/2022   CL 99 09/15/2022   CO2 23 09/15/2022    Lab Results  Component Value Date   ALT 12 09/04/2022   AST 17 09/04/2022   ALKPHOS 51 09/04/2022   BILITOT 0.4 09/04/2022     Microbiology: No results found for this or any previous visit (from the past 240 hour(s)).   Janene Madeira, MSN, NP-C [facility] for Infectious Disease [email or web address] Pager: [phone] Office: [phone] [facility] Main Line: [place] Communication Welcome

## 2022-09-16 NOTE — Progress Notes (Signed)
Pharmacy Antibiotic Note   [name] is a 67 y.o. 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   Continues on Vancomycin and Cefepime    Last vancomycin trough therapeutic at 14 mcg/mL (09/10/2022).  Renal function remains stable.  Afebrile, WBC WNL. Next Vanco trough 09/17/2022   Plan: Continue vanc '1250mg'$  IV Q12H Continue Cefepime 2g IV Q8H and Flagyl '500mg'$  IV Q8H Monitor renal function, culture results, weekly vanc trough  Thank you for involving pharmacy in this patient's care.  Vaughan Basta BS, PharmD, BCPS Clinical Pharmacist 09/16/2022 7:11 AM  Contact: [phone] after 3 PM  "Be curious, not judgmental..." -Jamal Maes

## 2022-09-16 NOTE — Progress Notes (Addendum)
PROGRESS NOTE   Subjective/Complaints:  Pt asked when her vanco level will be checked- pharmacy plans to check this tomorrow. She also reports she thinks several days ago aquacel was not used in the wound.  She is concerned about having a wound above spinal hardware.   LBM today  ROS:  +HA- intermittent +LE swelling-improving. Denies fevers, chills, cough, CP, SOB, abd pain, N/V/C, diarrhea, new/worsening paresthesias/weakness, vision changes or any other complaints at this time.    Objective:   No results found. No results for input(s): "WBC", "HGB", "HCT", "PLT" in the last 72 hours.   Recent Labs    09/15/22 0439  NA 131*  K 4.2  CL 99  CO2 23  GLUCOSE 116*  BUN 8  CREATININE 0.34*  CALCIUM 9.2     Intake/Output Summary (Last 24 hours) at 09/16/2022 0847 Last data filed at 09/16/2022 [id number] Gross per 24 hour  Intake 476 ml  Output 4350 ml  Net -3874 ml         Physical Exam: Vital Signs Blood pressure 137/74, pulse 84, temperature 98.4 F (36.9 C), resp. rate 20, height '5\' 1"'$  (1.549 m), weight 67.8 kg, SpO2 98 %.  Constitutional:  thin, NAD, sitting at edge of bed HENT: [place]/AT,  MMM  Eyes: EOMI, conjunctiva clear, PERRL Neck:     Comments: Dry dressing over trach site. Excellent phonation with trach capped >72 hours Cardiovascular: RRR, soft murmur heard (VSD), 1+ BLE swelling Pulmonary: CTAB no w/r/r, no increased WOB Abdominal: Soft, nontender, nondistended, positive bowel sounds/normal active Genitourinary:    Comments:Sacral wound dressing clean  foley in place-yellow urine Neurological: Follows commands, memory- decreased at times,  Alert and oriented x3, no hypertonia noted  PRIOR EXAM: Musculoskeletal:        General: Swelling present.     Cervical back: Normal range of motion.     Comments: Right leg discrepancy with weakness and leg rotated outward. Dependent edema Right forearm and hand and  left forearm.   Skin:    Comments: Well healed old right thigh incision. Wound VAC- has been removed midline back dressing dry and clean, low back  Neurological:     Mental Status: She is alert and oriented to person, place, and time.     Comments: Alert and oriented x 3 (needed some help with day) much improved insight and awareness. [place] Memory. Normal language and speech. Cranial nerve exam unremarkable. UE 4/5 prox to distal. RLE 2-/5 prox to 4/5 distally. LLE 2+/5 prox to 4/5 distally. Senses pain in all 4 limbs. No abnl resting tone.   Psychiatric:     Appears a little anxious  Assessment/Plan: 1. Functional deficits which require 3+ hours per day of interdisciplinary therapy in a comprehensive inpatient rehab setting. Physiatrist is providing close team supervision and 24 hour management of active medical problems listed below. Physiatrist and rehab team continue to assess barriers to discharge/monitor patient progress toward functional and medical goals  Care Tool:  Bathing  Bathing activity did not occur: Refused           Bathing assist       Upper Body Dressing/Undressing Upper body dressing  What is the patient wearing?: Hospital gown only    Upper body assist Assist Level: Total Assistance - Patient < 25%    Lower Body Dressing/Undressing Lower body dressing      What is the patient wearing?: Hospital gown only     Lower body assist Assist for lower body dressing: Total Assistance - Patient < 25%     Toileting Toileting Toileting Activity did not occur Landscape architect and hygiene only): N/A (no void or bm) (using catheter and rectal tube)  Toileting assist Assist for toileting: Dependent - Patient 0%     Transfers Chair/bed transfer  Transfers assist  Chair/bed transfer activity did not occur: Safety/medical concerns  Chair/bed transfer assist level: Maximal Assistance - Patient 25 - 49%     Locomotion Ambulation   Ambulation assist    Ambulation activity did not occur: Safety/medical concerns          Walk 10 feet activity   Assist  Walk 10 feet activity did not occur: Safety/medical concerns        Walk 50 feet activity   Assist Walk 50 feet with 2 turns activity did not occur: Safety/medical concerns         Walk 150 feet activity   Assist Walk 150 feet activity did not occur: Safety/medical concerns         Walk 10 feet on uneven surface  activity   Assist Walk 10 feet on uneven surfaces activity did not occur: Safety/medical concerns         Wheelchair     Assist Is the patient using a wheelchair?: Yes Type of Wheelchair: Manual Wheelchair activity did not occur: Safety/medical concerns         Wheelchair 50 feet with 2 turns activity    Assist    Wheelchair 50 feet with 2 turns activity did not occur: Safety/medical concerns       Wheelchair 150 feet activity     Assist  Wheelchair 150 feet activity did not occur: Safety/medical concerns       Blood pressure 137/74, pulse 84, temperature 98.4 F (36.9 C), resp. rate 20, height '5\' 1"'$  (1.549 m), weight 67.8 kg, SpO2 98 %.  Medical Problem List and Plan: 1. Functional deficits secondary to osteomyelitis,infection of lumbar surgical site complicated by meningitis/ventriculitis.             -pt also with significant deconditioning             -patient may not yet shower             -ELOS/Goals: 10/06/22, Min/Mod A with PT, OT, SLP  -Continue CIR, PT OT SL  2.  Antithrombotics: -DVT/anticoagulation:  Pharmaceutical: Lovenox '40mg'$  QD             -antiplatelet therapy: N/a 3. Pain Management: Butrans 77mg/hr patch for pain control w/ Flexeril '5mg'$  TID, Tylenol PRN, Oxycodone 7.'5mg'$  q4h PRN, Gabapentin '100mg'$  TID  -09/04/22 increased flexeril to 7.'5mg'$  TID -09/05/22 schedule Oxycodone '5mg'$  q6h and leave 2.'5mg'$  q4h PRN dosing -2/26 tylenol PRN for HA -2/28 Fioricet ordered PRN headache  4. Mood/Behavior/Sleep: LCSW  to follow for evaluation and support.             --Trazodone '100mg'$  QHS and melatonin '5mg'$  QHS PRN for  insomnia/mood  -09/04/22 placed sign on door to minimize interruptions 11p-5a; changed CBG checks as below to minimize interruptions             -antipsychotic agents:  Luvox '100mg'$   QHS and Lamictal '200mg'$  QD  -Continue Aricept '10mg'$  QD  -Seen by neuropsych-appreciate assistance 5. Neuropsych/cognition: This patient is not fully capable of making decisions on her own behalf. 6. Skin/Wound Care: WOC to follow for wound VAC changes --MASD managed by fecal system-- flexiseal clogged 09/04/22 AM but nursing states it's now working; monitor--might be able to d/c soon? -2/21 WOC stopped VAC, start aquacel and dressing -see flexiseal discussion below, #17 -2/27 WOC following, slough pulled away revealing 2cm deep defect -2/29 Spoke with Dr. Zada Finders today about wound depth, continue local wound care- continue to monitor, continue abx per ID,  no plan for procedure at this time  7. Fluids/Electrolytes/Nutrition: Monitor I/O. Continue tube feeds-->likely contributing to diarrhea             -monitor intake closely, continue vitamins -2/19 discontinue core track Consult dietician -2/27 decrease BMP to every other day  8.Sepsis w/ osteomyelitis/meningitis/encephalitis: Continue Cefepime 2g q8h, Vanc '1250mg'$  q12h, and Flagyl '500mg'$  q8h for at least 6 weeks             --end date per ID. Will follow up on plan during admit  -pharmacy following for Vanc dosing- appreciate- appears checking today -Pt due to follow up with ID, ID contacted and evaluated patient today  9. H/o PVT:  Monitor BP/heart rate TID--on coreg 12.'5mg'$  BID, Labetaolol '10mg'$  q2h PRN  -09/04/22 BP/HR fairly well controlled, cont regimen, monitor  -2/23  BP has been a little above goal, increase coreg dose to '25mg'$  BID  -2/27 fairly well controlled overall     09/16/2022    6:36 AM 09/15/2022    8:32 PM 09/15/2022    2:02 PM  Vitals with  BMI  Weight 149 lbs 8 oz    BMI [id number]    Systolic [id number]  Diastolic 74 82 95  Pulse 84 84 77      10. Urinary retention: Has failed 4 voiding trails. Has had foley since 12/23             -maintain foley cath, foley mgt per protocol -- Continue Flomax 0.'4mg'$  qPM. Will d/c urecholine.   11. VDRF s/p trach: Tolerating PMSV well -I ordered downsizing to CFS # 4 today--tolerated well -continue ATC w/ pulmonary hygiene -begin day time plugging of trach tomorrow. Decannulate soon. -Trial of capping with respiratory-  can trial 24 hour capping today -2/22 Leave trach uncapped tonight -2/28 plan for decannulation today, has had trach capped for >72hours -2/29 Lurline Idol was  removed - doing well   12. T2DM: Monitor BS ac/hs and use SSI for elevated BS.              --continue tube feeds with supplements between meals due to poor intake             --continue liberalized diet for now. -09/04/22 CBGs looking great, Changed SSI and CBG checks to TID AC + QHS to minimize interruptions overnight, since tube feeds are decreased/temporarily stopped currently -2/29 CBGS well controlled   CBG (last 3)  Recent Labs    09/15/22 2035 09/16/22 0635 09/16/22 1123  GLUCAP 137* 123* 148*    13. Hypokalemia: Recurrent likely due to diarrhea/intake.              --will add standing dose BID -->recheck labs on 02/17 and 02/19  -09/04/22 K+ 4.0, continue Klor 55mq BID, monitor on weekly labs -09/05/22 doesn't like Klor packet but no great alternative (Phos-Nak would have to be heavily  dosed to get equivalent K+, so only other alternative is IV-- will hold off for now) -2/19 Increase K+ supplement to 35mq BID, could consider change to tablet however this is a large tablet and she had trouble swallowing it previously so continue packet for now -2/20, discussed with pharmacy, change to tablet potassium, IV K+ ordered , daily labs -2/23 K+ down to 3.4, increase K+ supplement 224m from BID to TID -2/26  stable at 4.2 14. Anemia of critical illness: Hgb 7.5-8.9 range -2/26 stable at HGB 10 15. Hyponatremia/hypochloremia: Improving 125/87-->130/94.  -09/04/22 Na 129, Cl 95, both stable, continue salt tabs 1g BID -Cortisol testing done during hospitalization (13.8 on 08/30/22), was normal, but daughter and pt want to make sure this is followed up--defer to weekday team regarding ongoing ?Addison's testing -2/21 129, a little decreased, fluid restriction started -2/28 stable at 131 16. Encephalopathy/Delirium: Continues to have intermittent confusion--but much improved compared to 3 days ago per family.  --Continue delirium precautions. 17. Loose stools likely d/t TF and abx.             -reducing TF as above.  -flexiseal clogged 09/04/22 AM but nursing states it's now working; monitor--might be able to d/c soon? -add probiotics and fiber-- fiber changed to [address] on 09/04/22 since this should cause less clogging of Cortrak -Lomotil 1-2 tabs QID PRN -2/21 improved, rectal tube to help keep wound clean -2/22 continue rectal tube for now -09/11/22 rectal tube dislodged, stools clogging it d/t more formed, d/c for now and see how she does with bedpan, if soiling near wound then needs replacement of flexiseal -09/12/22 stools type 5-6, not super frequent, using diaper not bedpan-- looks like no stool is getting close to wound, but advised trying bedpan. Advised nursing that if stool becomes more frequent or more type 6-7, then flexiseal would need to be replaced. Monitor closely.  -2/29 LBM today, formed 18. Hypothyroidism: continue synthroid 8830mQD 19. Chronic combined CHF, BLE Swelling: recent 2D echo done 08/20/2022 showed apical hypokinesis, LVEF 40-45%, RV function was reduced. Intermittently diuresed during hospitalization, most recently given '40mg'$  IV lasix on 09/03/22 -09/04/22 noted to still have BLE swelling, start '20mg'$  PO lasix and monitor lytes/BP/swelling -2/23 stable, continue to  monitor -09/11/22 wt up a little, but swelling fairly stable; encouraged TED hose use during day -2/27 wt a little up today, monitor trend Filed Weights   09/14/22 0634 09/15/22 0500 09/16/22 0636  Weight: 66.7 kg 67.7 kg 67.8 kg    20. Oral ulcer  --2/28 Orajel for oral ulcer discomfort  LOS: 13 days A FACE TO FACE EVALUATION WAS PERFORMED  YurJennye Boroughs29/2024, 8:47 AM

## 2022-09-16 NOTE — Progress Notes (Signed)
Patient ID: [name], female   DOB: Aug 02, 1955, 67 y.o.   MRN: [id number]  Met with pt and husband is present in her room, so updated both regarding team conference goals of min-mod level of assist and discharge date 3/20. Pt glad to finally have a date and not just guessing. Will ask speech to place a calendar to count down the days. Encouraged husband to begin hiring assist so will be ready by discharge date. Pt has gotten trach out and is eating and drinking well. Will work toward discharge and continue to provide support pt aware all are here to help her get better.

## 2022-09-17 DIAGNOSIS — G8929 Other chronic pain: Secondary | ICD-10-CM

## 2022-09-17 DIAGNOSIS — G9340 Encephalopathy, unspecified: Secondary | ICD-10-CM

## 2022-09-17 DIAGNOSIS — F5101 Primary insomnia: Secondary | ICD-10-CM

## 2022-09-17 DIAGNOSIS — M546 Pain in thoracic spine: Secondary | ICD-10-CM

## 2022-09-17 LAB — BASIC METABOLIC PANEL
Anion gap: 11 (ref 5–15)
BUN: 7 mg/dL — ABNORMAL LOW (ref 8–23)
CO2: 21 mmol/L — ABNORMAL LOW (ref 22–32)
Calcium: 9.4 mg/dL (ref 8.9–10.3)
Chloride: 98 mmol/L (ref 98–111)
Creatinine, Ser: 0.32 mg/dL — ABNORMAL LOW (ref 0.44–1.00)
GFR, Estimated: >60 mL/min (ref >60)
Glucose, Bld: 124 mg/dL — ABNORMAL HIGH (ref 70–99)
Potassium: 4.2 mmol/L (ref 3.5–5.1)
Sodium: 130 mmol/L — ABNORMAL LOW (ref 135–145)

## 2022-09-17 LAB — VANCOMYCIN, TROUGH: Vancomycin Tr: 21 ug/mL (ref 15–20)

## 2022-09-17 LAB — GLUCOSE, CAPILLARY
Glucose-Capillary: 117 mg/dL — ABNORMAL HIGH (ref 70–99)
Glucose-Capillary: 131 mg/dL — ABNORMAL HIGH (ref 70–99)
Glucose-Capillary: 105 mg/dL — ABNORMAL HIGH (ref 70–99)
Glucose-Capillary: 106 mg/dL — ABNORMAL HIGH (ref 70–99)

## 2022-09-17 MED ORDER — SODIUM CHLORIDE 0.9 % IV SOLN
INTRAVENOUS | Status: DC | PRN
  Administered 2022-09-17: 300 mL via INTRAVENOUS

## 2022-09-17 MED ORDER — OXYCODONE-ACETAMINOPHEN 5-325 MG PO TABS
1.0000 | ORAL_TABLET | ORAL | Status: DC | PRN
  Administered 2022-09-17 – 2022-10-04 (×39): 1 via ORAL
  Filled 2022-09-17 (×41): qty 1

## 2022-09-17 MED ORDER — VANCOMYCIN HCL IN DEXTROSE 1-5 GM/200ML-% IV SOLN
1000.0000 mg | Freq: Two times a day (BID) | INTRAVENOUS | Status: AC
  Administered 2022-09-17 – 2022-09-30 (×27): 1000 mg via INTRAVENOUS
  Filled 2022-09-17 (×27): qty 200

## 2022-09-17 MED ORDER — MELATONIN 5 MG PO TABS
10.0000 mg | ORAL_TABLET | Freq: Every evening | ORAL | Status: DC | PRN
  Administered 2022-09-17: 10 mg via ORAL
  Administered 2022-09-18 – 2022-09-19 (×2): 5 mg via ORAL
  Filled 2022-09-17 (×3): qty 2

## 2022-09-17 MED ORDER — ACETAMINOPHEN 325 MG PO TABS
325.0000 mg | ORAL_TABLET | Freq: Four times a day (QID) | ORAL | Status: DC | PRN
  Administered 2022-09-18 – 2022-10-07 (×4): 650 mg via ORAL
  Filled 2022-09-17 (×4): qty 2

## 2022-09-17 NOTE — Progress Notes (Signed)
Occupational Therapy Weekly Progress Note  Patient Details  Name: [name] MRN: [id number] Date of Birth: Jan 16, 1956  Beginning of progress report period: September 10, 2022  End of progress report period: September 17, 2022  Today's Date: 09/17/2022 OT Individual Time: 1100-1200 OT Individual Time Calculation (min): 60 min    Patient has met 3 of 3 short term goals.  [name] is making slow, steady progress toward her OT goals. Her sitting balance is consistently at (S) level. She can stand with max A of 1 with heavy facilitation for upright posture and terminal hip extension occasionally, but frequently requires 2nd person. Family is constantly present and very supportive.   Patient continues to demonstrate the following deficits: muscle weakness and muscle joint tightness, decreased cardiorespiratoy endurance, decreased coordination and decreased motor planning, decreased initiation, decreased attention, decreased awareness, decreased problem solving, decreased safety awareness, decreased memory, and delayed processing, and decreased sitting balance, decreased standing balance, decreased postural control, and decreased balance strategies and therefore will continue to benefit from skilled OT intervention to enhance overall performance with BADL and Reduce care partner burden.  Patient progressing toward long term goals..  Continue plan of care.  OT Short Term Goals Week 2:  OT Short Term Goal 1 (Week 2): Pt will complete sit > stand from EOB in stedy with mod +2 A OT Short Term Goal 1 - Progress (Week 2): Met OT Short Term Goal 2 (Week 2): Pt will maintain dynamic sitting balance with (S) for entire ADL routine EOB OT Short Term Goal 2 - Progress (Week 2): Met OT Short Term Goal 3 (Week 2): Pt will bridge in bed with mod A for reducing caregiver burden OT Short Term Goal 3 - Progress (Week 2): Met Week 3:  OT Short Term Goal 1 (Week 3): Pt will complete sit <> stand with max A of 1  consistently OT Short Term Goal 2 (Week 3): Pt will complete bed mobility to EOB with mod A OT Short Term Goal 3 (Week 3): Pt will complete stand pivot transfer with max A consistently  Skilled Therapeutic Interventions/Progress Updates:    Pt received supine with back pain but agreeable to session and overall very pleasant today. She came to EOB with max A, but improved trunk initiation. She sat EOB with (S). She completed several simple grooming tasks seated with (S). She stood with the RW with mod A ! Trialed use of the eva walker and she was able to take 2 steps forward with heavy mod-max A +2 support. Poor hip extension. Extended rest break required between each set. 3x total standing with the eva walker and the final trial she completed a stand pivot to the TIS with heavy max A. She completed oral care at the sink in the TIS with set up assist. She was taken to the therapy gym where she completed 2x5 overhead and forward reach with a 5 lb dowel with mod facilitation from OT to promote increased shoulder strengthening to increase offloading of LE during standing with the RW. She returned to her room and completed a stand pivot back to bed with max A. She was left supine with all needs met, bed alarm set. Daughter present.   Therapy Documentation Precautions:  Precautions Precautions: Fall, Back Precaution Comments: trach, rectal tube, foley Required Braces or Orthoses: Other Brace Other Brace: prevlon boots Restrictions Weight Bearing Restrictions: No Other Position/Activity Restrictions: external rotation R hip at baseline at all times, RLE shorter than LLE, pt ambulated  household distances PTA, w/c for community   Therapy/Group: Individual Therapy  Curtis Sites 09/17/2022, 8:23 AM

## 2022-09-17 NOTE — Progress Notes (Addendum)
PROGRESS NOTE   Subjective/Complaints:  Reports poor sleep last night. She has been getting melatonin '5mg'$ .  Reports a lot of pain in her back- oxycodone 2.5 not helping much. She takes it PRN about 2 times a day but also has scheduled '5mg'$  oxycodone. She feels that "percocet" worked better when she took it outpatient than oxycodone.   ROS:  +HA- intermittent  + insomnia, +LE swelling-improving. Denies fevers, chills, cough, CP, SOB, abd pain, N/V/C, diarrhea, new/worsening paresthesias/weakness, vision changes or any other complaints at this time.    Objective:   No results found. No results for input(s): "WBC", "HGB", "HCT", "PLT" in the last 72 hours.   Recent Labs    09/15/22 0439 09/17/22 0525  NA 131* 130*  K 4.2 4.2  CL 99 98  CO2 23 21*  GLUCOSE 116* 124*  BUN 8 7*  CREATININE 0.34* 0.32*  CALCIUM 9.2 9.4     Intake/Output Summary (Last 24 hours) at 09/17/2022 0853 Last data filed at 09/16/2022 2023 Gross per 24 hour  Intake 720 ml  Output 4100 ml  Net -3380 ml         Physical Exam: Vital Signs Blood pressure (!) 146/84, pulse 88, temperature 98.1 F (36.7 C), temperature source Oral, resp. rate 20, height '5\' 1"'$  (1.549 m), weight 67.8 kg, SpO2 99 %.  Constitutional:  thin, NAD, sitting at edge of bed HENT: [place]/AT,  MMM  Eyes: EOMI, conjunctiva clear, PERRL Neck:     Comments: Dry dressing over trach site.  Cardiovascular: RRR, soft murmur heard (VSD), 1+ BLE swelling Pulmonary: CTAB no w/r/r, no increased WOB Abdominal: Soft, nontender, nondistended, positive bowel sounds/normal active Genitourinary:    Comments:Sacral wound dressing clean  foley in place-yellow urine Neurological: Follows commands, memory- decreased at times,  Alert and oriented x3, no hypertonia noted  PRIOR EXAM: Musculoskeletal:        General: Swelling present.     Cervical back: Normal range of motion.     Comments: Right  leg discrepancy with weakness and leg rotated outward. Dependent edema Right forearm and hand and left forearm.   Skin:    Comments: Well healed old right thigh incision.  Neurological:     Mental Status: She is alert and oriented to person, place, and time.     Comments: Alert and oriented x 3 (needed some help with day) much improved insight and awareness. [place] Memory. Normal language and speech. Cranial nerve exam unremarkable. UE 4/5 prox to distal. RLE 2-/5 prox to 4/5 distally. LLE 2+/5 prox to 4/5 distally. Senses pain in all 4 limbs. No abnl resting tone.   Psychiatric:     appropriate, flat affect   Assessment/Plan: 1. Functional deficits which require 3+ hours per day of interdisciplinary therapy in a comprehensive inpatient rehab setting. Physiatrist is providing close team supervision and 24 hour management of active medical problems listed below. Physiatrist and rehab team continue to assess barriers to discharge/monitor patient progress toward functional and medical goals  Care Tool:  Bathing  Bathing activity did not occur: Refused           Bathing assist       Upper  Body Dressing/Undressing Upper body dressing   What is the patient wearing?: Hospital gown only    Upper body assist Assist Level: Total Assistance - Patient < 25%    Lower Body Dressing/Undressing Lower body dressing      What is the patient wearing?: Hospital gown only     Lower body assist Assist for lower body dressing: Total Assistance - Patient < 25%     Toileting Toileting Toileting Activity did not occur Landscape architect and hygiene only): N/A (no void or bm) (using catheter and rectal tube)  Toileting assist Assist for toileting: Dependent - Patient 0%     Transfers Chair/bed transfer  Transfers assist  Chair/bed transfer activity did not occur: Safety/medical concerns  Chair/bed transfer assist level: Maximal Assistance - Patient 25 - 49%      Locomotion Ambulation   Ambulation assist   Ambulation activity did not occur: Safety/medical concerns          Walk 10 feet activity   Assist  Walk 10 feet activity did not occur: Safety/medical concerns        Walk 50 feet activity   Assist Walk 50 feet with 2 turns activity did not occur: Safety/medical concerns         Walk 150 feet activity   Assist Walk 150 feet activity did not occur: Safety/medical concerns         Walk 10 feet on uneven surface  activity   Assist Walk 10 feet on uneven surfaces activity did not occur: Safety/medical concerns         Wheelchair     Assist Is the patient using a wheelchair?: Yes Type of Wheelchair: Manual Wheelchair activity did not occur: Safety/medical concerns         Wheelchair 50 feet with 2 turns activity    Assist    Wheelchair 50 feet with 2 turns activity did not occur: Safety/medical concerns       Wheelchair 150 feet activity     Assist  Wheelchair 150 feet activity did not occur: Safety/medical concerns       Blood pressure (!) 146/84, pulse 88, temperature 98.1 F (36.7 C), temperature source Oral, resp. rate 20, height '5\' 1"'$  (1.549 m), weight 67.8 kg, SpO2 99 %.  Medical Problem List and Plan: 1. Functional deficits secondary to osteomyelitis,infection of lumbar surgical site complicated by meningitis/ventriculitis.             -pt also with significant deconditioning             -patient may not yet shower             -ELOS/Goals: 10/06/22, Min/Mod A with PT, OT, SLP  -Continue CIR, PT OT SL  3/1 Repeat MRI brain around 3/13 prior to stopping abx per Dr Zada Finders note 2/14   2.  Antithrombotics: -DVT/anticoagulation:  Pharmaceutical: Lovenox '40mg'$  QD             -antiplatelet therapy: N/a  3. Pain Management: Butrans 1mg/hr patch for pain control w/ Flexeril '5mg'$  TID, Tylenol PRN, Oxycodone 7.'5mg'$  q4h PRN, Gabapentin '100mg'$  TID  -09/04/22 increased flexeril to 7.'5mg'$   TID -09/05/22 schedule Oxycodone '5mg'$  q6h and leave 2.'5mg'$  q4h PRN dosing -2/26 tylenol PRN for HA -2/28 Fioricet ordered PRN headache -09/17/22 will change PRN oxycodone 2.'5mg'$  to percocet '5mg'$ , consider increase gabapentin to help with pain and HA- make changes gradually due to hx of encephalopathy  4. Mood/Behavior/Sleep: LCSW to follow for evaluation and support.             --  Trazodone '100mg'$  QHS and melatonin '5mg'$  QHS PRN for  insomnia/mood  -09/04/22 placed sign on door to minimize interruptions 11p-5a; changed CBG checks as below to minimize interruptions             -antipsychotic agents:  Luvox '100mg'$  QHS and Lamictal '200mg'$  QD  -Continue Aricept '10mg'$  QD  -Seen by neuropsych-appreciate assistance  -3/1 increase melatonin to '10mg'$  dose  5. Neuropsych/cognition: This patient is not fully capable of making decisions on her own behalf. 6. Skin/Wound Care: WOC to follow for wound VAC changes --MASD managed by fecal system-- flexiseal clogged 09/04/22 AM but nursing states it's now working; monitor--might be able to d/c soon? -2/21 WOC stopped VAC, start aquacel and dressing -see flexiseal discussion below, #17 -2/27 WOC following, slough pulled away revealing 2cm deep defect -2/29 Spoke with Dr. Zada Finders today about wound depth, continue local wound care- continue to monitor, continue abx per ID,  no plan for procedure at this time  7. Fluids/Electrolytes/Nutrition: Monitor I/O. Continue tube feeds-->likely contributing to diarrhea             -monitor intake closely, continue vitamins -2/19 discontinue core track Consult dietician -2/27 decrease BMP to every other day  8.Sepsis w/ osteomyelitis/meningitis/encephalitis: Continue Cefepime 2g q8h, Vanc '1250mg'$  q12h, and Flagyl '500mg'$  q8h for at least 6 weeks             --3/1 per ID continue vanco and cefepime through 09/30/22 with plan for suppressive Levaquin and doxycycline with duration to be determined outpatient- f/uw with Dr [name],  metronidazole has been discontinued    9. H/o PVT:  Monitor BP/heart rate TID--on coreg 12.'5mg'$  BID, Labetaolol '10mg'$  q2h PRN  -09/04/22 BP/HR fairly well controlled, cont regimen, monitor  -2/23  BP has been a little above goal, increase coreg dose to '25mg'$  BID  -3/1 well controlled     09/17/2022    5:47 AM 09/16/2022    8:17 PM 09/16/2022    1:30 PM  Vitals with BMI  Systolic [id number]  Diastolic 84 81 85  Pulse 88 86 79      10. Urinary retention: Has failed 4 voiding trails. Has had foley since 12/23             -maintain foley cath, foley mgt per protocol -- Continue Flomax 0.'4mg'$  qPM. Will d/c urecholine.   11. VDRF s/p trach: Tolerating PMSV well -I ordered downsizing to CFS # 4 today--tolerated well -continue ATC w/ pulmonary hygiene -begin day time plugging of trach tomorrow. Decannulate soon. -Trial of capping with respiratory-  can trial 24 hour capping today -2/22 Leave trach uncapped tonight -2/28 plan for decannulation today, has had trach capped for >72hours -2/29 Lurline Idol was  removed - doing well   12. T2DM: Monitor BS ac/hs and use SSI for elevated BS.              --continue tube feeds with supplements between meals due to poor intake             --continue liberalized diet for now. -09/04/22 CBGs looking great, Changed SSI and CBG checks to TID AC + QHS to minimize interruptions overnight, since tube feeds are decreased/temporarily stopped currently -3/1 CBGs well controlled overall   CBG (last 3)  Recent Labs    09/16/22 1640 09/16/22 2018 09/17/22 0548  GLUCAP 89 188* 117*     13. Hypokalemia: Recurrent likely due to diarrhea/intake.              --  will add standing dose BID -->recheck labs on 02/17 and 02/19  -09/04/22 K+ 4.0, continue Klor 21mq BID, monitor on weekly labs -09/05/22 doesn't like Klor packet but no great alternative (Phos-Nak would have to be heavily dosed to get equivalent K+, so only other alternative is IV-- will hold off for  now) -2/19 Increase K+ supplement to 472m BID, could consider change to tablet however this is a large tablet and she had trouble swallowing it previously so continue packet for now -2/20, discussed with pharmacy, change to tablet potassium, IV K+ ordered , daily labs -2/23 K+ down to 3.4, increase K+ supplement 2084mfrom BID to TID -2/26 stable at 4.2 14. Anemia of critical illness: Hgb 7.5-8.9 range -2/26 stable at HGB 10 15. Hyponatremia/hypochloremia: Improving 125/87-->130/94.  -09/04/22 Na 129, Cl 95, both stable, continue salt tabs 1g BID -Cortisol testing done during hospitalization (13.8 on 08/30/22), was normal, but daughter and pt want to make sure this is followed up--defer to weekday team regarding ongoing ?Addison's testing -2/21 129, a little decreased, fluid restriction started -2/28 stable at 131 16. Encephalopathy/Delirium: Continues to have intermittent confusion--but much improved compared to 3 days ago per family.  --Continue delirium precautions. 17. Loose stools likely d/t TF and abx.             -reducing TF as above.  -flexiseal clogged 09/04/22 AM but nursing states it's now working; monitor--might be able to d/c soon? -add probiotics and fiber-- fiber changed to NutIronr RD on 09/04/22 since this should cause less clogging of Cortrak -Lomotil 1-2 tabs QID PRN -2/21 improved, rectal tube to help keep wound clean -2/22 continue rectal tube for now -09/11/22 rectal tube dislodged, stools clogging it d/t more formed, d/c for now and see how she does with bedpan, if soiling near wound then needs replacement of flexiseal -09/12/22 stools type 5-6, not super frequent, using diaper not bedpan-- looks like no stool is getting close to wound, but advised trying bedpan. Advised nursing that if stool becomes more frequent or more type 6-7, then flexiseal would need to be replaced. Monitor closely.  -2/29 LBM today, formed 18. Hypothyroidism: continue synthroid 91m22mD 19.  Chronic combined CHF, BLE Swelling: recent 2D echo done 08/20/2022 showed apical hypokinesis, LVEF 40-45%, RV function was reduced. Intermittently diuresed during hospitalization, most recently given '40mg'$  IV lasix on 09/03/22 -09/04/22 noted to still have BLE swelling, start '20mg'$  PO lasix and monitor lytes/BP/swelling -3/1  weight appears stable-continue to monitor Filed Weights   09/14/22 0634 09/15/22 0500 09/16/22 0636  Weight: 66.7 kg 67.7 kg 67.8 kg    20. Oral ulcer  --2/28 Orajel for oral ulcer discomfort  LOS: 14 days A FACE TO FACE EVALUATION WAS PERFORMED  YuriJennye Boroughs/2024, 8:53 AM

## 2022-09-17 NOTE — Progress Notes (Signed)
Patient has foley catheter for urinary retention. Site is clean, dry and intact. Bag below level of bladder, catheter secured, standard drainage bag. Securing device in place, clamped.

## 2022-09-17 NOTE — Progress Notes (Signed)
Speech Language Pathology Weekly Progress and Session Note  Patient Details  Name: [name] MRN: [id number] Date of Birth: 27-Oct-1955  Beginning of progress report period: September 10, 2022 End of progress report period: September 17, 2022  Today's Date: 09/17/2022 SLP Individual Time: [id number] SLP Individual Time Calculation (min): 45 min  Short Term Goals: Week 2: SLP Short Term Goal 1 (Week 2): Pt will use external memory aids to recall important information with min-to-mod A verbal cues. SLP Short Term Goal 1 - Progress (Week 2): Met SLP Short Term Goal 2 (Week 2): Pt will recall 2 areas of cognitive impairment with min-to-modA verbal cues. SLP Short Term Goal 2 - Progress (Week 2): Met SLP Short Term Goal 3 (Week 2): Pt will participate in therapeutic task for 20 minutes with min-to-modA verbal cues. SLP Short Term Goal 3 - Progress (Week 2): Met SLP Short Term Goal 4 (Week 2): Patient will complete functional and semi-complex problem solving tasks with min-to-mod A verbal cues SLP Short Term Goal 4 - Progress (Week 2): Met  New Short Term Goals: Week 3: SLP Short Term Goal 1 (Week 3): Pt will use external memory aids to recall important information with min A verbal cues. SLP Short Term Goal 2 (Week 3): Pt will demonstrate awareness of errors and repair with min-to-modA verbal cues. SLP Short Term Goal 3 (Week 3): Pt will participate in therapeutic task for 10 minute intervals with min A verbal cues. SLP Short Term Goal 4 (Week 3): Patient will complete functional and semi-complex problem solving tasks with min A verbal cues  Weekly Progress Updates: Patient has made steady gains this week and has met 4 of 4 STGs this reporting period due to improved cognitive linguistic function and insight into deficits. Pt's trach was decannulated and pt is able to verbalize at the conversational level with voice/speech WFL. Pt continues to tolerate a regular consistency diet with thin  liquids at independent level for swallowing safety. Patient is currently completing cognitive tasks with overall min-to-mod A verbal/visual cues in regards to sustained attention, functional recall, problem solving, and intellectual awareness. Pt can be self limiting, however, this appears to fluctuate. Patient and family education is ongoing. Patient would benefit from continued skilled SLP intervention to maximize cognitive functioning and overall functional independence prior to discharge. Pt would benefit from f/u SLP intervention  at discharge in home health or outpatient setting.   Intensity: Minumum of 1-2 x/day, 30 to 90 minutes Frequency: 3 to 5 out of 7 days Duration/Length of Stay: 3/20 Treatment/Interventions: Cognitive remediation/compensation;Internal/external aids;Cueing hierarchy;Patient/family education;Therapeutic Activities;Functional tasks;Therapeutic Exercise  Daily Session Skilled Therapeutic Interventions: Skilled ST treatment focused on cognitive goals. Pt was greeted in bed on arrival and accompanied by her daughter. Pt was in good spirits and appeared more motivated to participate today. Pt externally distracted by discomfort in bed. Pt/dtr provided repositioning with mod A. Pt with increased effort and ability for bed mobility. SLP facilitated a mildly complex verbal reasoning task with min-to-mod A verbal redirection cues for sustained attention for 15 minute intervals and recall of task instructions; sup A verbal cues for verbal reasoning. Pt reported she enjoys "brain exercises" and provided positive feedback for session. Patient was left in bed with alarm activated and immediate needs within reach at end of session. Continue per current plan of care.       General    Pain    Therapy/Group: Individual Therapy  [name] 09/17/2022, 6:39 PM

## 2022-09-17 NOTE — Progress Notes (Signed)
Physical Therapy Session Note  Patient Details  Name: [name] MRN: [id number] Date of Birth: Dec 28, 1955  Today's Date: 09/17/2022 PT Individual Time: 0900-0950 PT Individual Time Calculation (min): 50 min   Short Term Goals: Week 2:  PT Short Term Goal 1 (Week 2): Pt will perform bed mobility with ModA +1. PT Short Term Goal 2 (Week 2): Pt will perform sit<>stand with MaxA +1. PT Short Term Goal 3 (Week 2): Pt will perform seat-to-seat transfers with MaxA +1 PT Short Term Goal 4 (Week 2): Pt with perform standing balance with alternating bil/single UE support for 1-2 minutes. PT Short Term Goal 5 (Week 2): Pt will initiate pre-gait training.  Skilled Therapeutic Interventions/Progress Updates: Pt presented in bed with husband leaving room reluctantly agreeable to therapy. Pt c/o pain in back and later on in session c/o nausea. MD arrived for daily assessment with pt and MD spending some extra time speaking about pain management. Once completed, pt agreeable to attempt Sit to stand from EOB. Pt performed supine to sit with heavy modA and use of bed features. Pt did not demonstrate any posterior or heavy lateral leans this session. Pt was set up with elevated bed and was able to complete x 4 successful Sit to stand with consistent modA x 2. Pt however was unable to sustain standing for any significant amount of time except for first stand which pt was able to tolerate ~10seconds. Once completed pt c/o increased nausea. Pt was transferred back to supine maxA x 1 and was boosted to [facility] dependent x 2. Pt was repositioned to comfort and left with bed alarm on, call  bell within reach and pt resting comfortably with emesis bag within reach.      Therapy Documentation Precautions:  Precautions Precautions: Fall, Back Precaution Comments: trach, rectal tube, foley Required Braces or Orthoses: Other Brace Other Brace: prevlon boots Restrictions Weight Bearing Restrictions: No Other  Position/Activity Restrictions: external rotation R hip at baseline at all times, RLE shorter than LLE, pt ambulated household distances PTA, w/c for community General: PT Amount of Missed Time (min): 10 Minutes PT Missed Treatment Reason: Patient ill (Comment) (nausea) Vital Signs:  Pain:   Mobility:   Locomotion :    Trunk/Postural Assessment :    Balance:   Exercises:   Other Treatments:      Therapy/Group: Individual Therapy  Alvester Eads 09/17/2022, 4:03 PM

## 2022-09-17 NOTE — Progress Notes (Signed)
Pharmacy Antibiotic Note   [name] is a 67 y.o. 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   Continues on Vancomycin and Cefepime    Last vancomycin trough supratherapeutic at 21 mcg/mL (09/17/2022).  Renal function remains stable.  Afebrile, WBC WNL. Next Vanco trough 09/20/2022   Plan: Reduce vanc '1000mg'$  IV Q12H Continue Cefepime 2g IV Q8H and Flagyl '500mg'$  IV Q8H Monitor renal function, culture results, weekly vanc trough  Thank you for involving pharmacy in this patient's care.  Vaughan Basta BS, PharmD, BCPS Clinical Pharmacist 09/17/2022 7:19 AM  Contact: [phone] after 3 PM  "Be curious, not judgmental..." -Jamal Maes

## 2022-09-17 NOTE — Progress Notes (Signed)
Date and time results received: 09/17/22 6:28 AM   Test: Vancomycin Trough Critical Value: 21  Name of Provider Notified: Pharmacy Called/Notified  Orders Received? Or Actions Taken?: Actions Taken

## 2022-09-17 NOTE — Progress Notes (Signed)
Physical Therapy Session Note  Patient Details  Name: [name] MRN: [id number] Date of Birth: 12-06-1955  Today's Date: 09/17/2022 PT Individual Time: [id number] PT Individual Time Calculation (min): 45 min   Short Term Goals: Week 2:  PT Short Term Goal 1 (Week 2): Pt will perform bed mobility with ModA +1. PT Short Term Goal 2 (Week 2): Pt will perform sit<>stand with MaxA +1. PT Short Term Goal 3 (Week 2): Pt will perform seat-to-seat transfers with MaxA +1 PT Short Term Goal 4 (Week 2): Pt with perform standing balance with alternating bil/single UE support for 1-2 minutes. PT Short Term Goal 5 (Week 2): Pt will initiate pre-gait training.  Skilled Therapeutic Interventions/Progress Updates:     Pt received supine in bed and agrees to therapy. Reports significant back pain. RN arrives during session to provide pain meds and PT provides rest breaks as needed and mobility to manage pain. Pt performs supine>R sidelying>sit with maxA and cues for logrolling and sequencing. Pt sits at EOB for extended time to acclimate to upright positioning. Stand pivot transfer from bed to [facility] with maxA +2 and cues for initiation, body mechanics, sequencing and positioning. WC transport to gym. Stand pivot to mat with same cues and assistance. Pt performs sit to stand x3 reps from slightly elevated mat, and remains standing to fatigue. First stand requires maxA +2 with side by side assistance and manual support at hips, with PT blocking L knee to encourage full extension. Pt able to remain in standing ~2 minutes with maxA +2. Extended seated rest break. For subsequent stands, PT provides maxA +1, facing pt and using both hands to facilitate hip extension and thighs to promote knee extension. Pt stands 1-1.5 minutes for each stand with maxA +1. MaxA +2 required for stand pivot back to WC. PT encourages pt to remain in Tilt in space [facility] following session and pt is agreeable. Pt left seated in WC with all needs  within reach.   Therapy Documentation Precautions:  Precautions Precautions: Fall, Back Precaution Comments: trach, rectal tube, foley Required Braces or Orthoses: Other Brace Other Brace: prevlon boots Restrictions Weight Bearing Restrictions: No Other Position/Activity Restrictions: external rotation R hip at baseline at all times, RLE shorter than LLE, pt ambulated household distances PTA, w/c for community General: PT Amount of Missed Time (min): 10 Minutes PT Missed Treatment Reason: Patient ill (Comment) (nausea)    Therapy/Group: Individual Therapy  Breck Coons, PT, DPT 09/17/2022, 4:45 PM

## 2022-09-18 LAB — GLUCOSE, CAPILLARY
Glucose-Capillary: 129 mg/dL — ABNORMAL HIGH (ref 70–99)
Glucose-Capillary: 130 mg/dL — ABNORMAL HIGH (ref 70–99)
Glucose-Capillary: 137 mg/dL — ABNORMAL HIGH (ref 70–99)
Glucose-Capillary: 146 mg/dL — ABNORMAL HIGH (ref 70–99)
Glucose-Capillary: 93 mg/dL (ref 70–99)

## 2022-09-18 MED ORDER — POTASSIUM CHLORIDE CRYS ER 20 MEQ PO TBCR
30.0000 meq | EXTENDED_RELEASE_TABLET | Freq: Two times a day (BID) | ORAL | Status: DC
  Administered 2022-09-18 – 2022-10-13 (×50): 30 meq via ORAL
  Filled 2022-09-18 (×51): qty 1

## 2022-09-18 NOTE — Progress Notes (Signed)
PROGRESS NOTE   Subjective/Complaints:  Had some difficulty swallowing K+ pill this morning, states she's had esophageal dilations in the past and this isn't anything new for her-- feels fine now, just had her medication get a little stuck earlier but was able to clear it. Drinking and eating fine now. Not concerned. Would love if K+ pill could be reduced in frequency.  No longer having diarrhea! LBM yesterday. Still has foley, no issues with that. Pain meds helping but "it takes a while" sometimes.  Otherwise denies other complaints.   ROS:  +HA- intermittent  + insomnia, +LE swelling-improving. Denies fevers, chills, cough, CP, SOB, abd pain, N/V/C, diarrhea, new/worsening paresthesias/weakness, vision changes or any other complaints at this time.    Objective:   No results found. No results for input(s): "WBC", "HGB", "HCT", "PLT" in the last 72 hours.   Recent Labs    09/17/22 0525  NA 130*  K 4.2  CL 98  CO2 21*  GLUCOSE 124*  BUN 7*  CREATININE 0.32*  CALCIUM 9.4    Intake/Output Summary (Last 24 hours) at 09/18/2022 1255 Last data filed at 09/18/2022 0700 Gross per 24 hour  Intake 718.32 ml  Output 3200 ml  Net -2481.68 ml        Physical Exam: Vital Signs Blood pressure (!) 148/81, pulse 97, temperature 97.8 F (36.6 C), temperature source Oral, resp. rate 18, height '5\' 1"'$  (1.549 m), weight 67.8 kg, SpO2 98 %.  Constitutional:  thin, NAD, sitting up in bed eating and drinking without difficulty HENT: [place]/AT,  MMM  Eyes: EOMI, conjunctiva clear, PERRL Neck:     Comments: Dry dressing over trach site.  Cardiovascular: RRR, soft murmur heard (VSD), trace BLE swelling-improved Pulmonary: CTAB no w/r/r, no increased WOB Abdominal: Soft, nontender, nondistended, positive bowel sounds/normal active Genitourinary:    Comments:Sacral wound dressing clean-not reassessed this morning  foley in place-yellow  urine Neurological: Follows commands, memory- decreased at times but stable compared to last week,  Alert and oriented x3, no hypertonia noted  PRIOR EXAM: Musculoskeletal:        General: Swelling present.     Cervical back: Normal range of motion.     Comments: Right leg discrepancy with weakness and leg rotated outward. Dependent edema Right forearm and hand and left forearm.   Skin:    Comments: Well healed old right thigh incision.  Neurological:     Mental Status: She is alert and oriented to person, place, and time.     Comments: Alert and oriented x 3 (needed some help with day) much improved insight and awareness. [place] Memory. Normal language and speech. Cranial nerve exam unremarkable. UE 4/5 prox to distal. RLE 2-/5 prox to 4/5 distally. LLE 2+/5 prox to 4/5 distally. Senses pain in all 4 limbs. No abnl resting tone.   Psychiatric:     appropriate, flat affect   Assessment/Plan: 1. Functional deficits which require 3+ hours per day of interdisciplinary therapy in a comprehensive inpatient rehab setting. Physiatrist is providing close team supervision and 24 hour management of active medical problems listed below. Physiatrist and rehab team continue to assess barriers to discharge/monitor patient progress toward functional  and medical goals  Care Tool:  Bathing  Bathing activity did not occur: Refused           Bathing assist       Upper Body Dressing/Undressing Upper body dressing   What is the patient wearing?: Hospital gown only    Upper body assist Assist Level: Total Assistance - Patient < 25%    Lower Body Dressing/Undressing Lower body dressing      What is the patient wearing?: Hospital gown only     Lower body assist Assist for lower body dressing: Total Assistance - Patient < 25%     Toileting Toileting Toileting Activity did not occur Landscape architect and hygiene only): N/A (no void or bm) (using catheter and rectal tube)  Toileting assist  Assist for toileting: Dependent - Patient 0%     Transfers Chair/bed transfer  Transfers assist  Chair/bed transfer activity did not occur: Safety/medical concerns  Chair/bed transfer assist level: Maximal Assistance - Patient 25 - 49%     Locomotion Ambulation   Ambulation assist   Ambulation activity did not occur: Safety/medical concerns          Walk 10 feet activity   Assist  Walk 10 feet activity did not occur: Safety/medical concerns        Walk 50 feet activity   Assist Walk 50 feet with 2 turns activity did not occur: Safety/medical concerns         Walk 150 feet activity   Assist Walk 150 feet activity did not occur: Safety/medical concerns         Walk 10 feet on uneven surface  activity   Assist Walk 10 feet on uneven surfaces activity did not occur: Safety/medical concerns         Wheelchair     Assist Is the patient using a wheelchair?: Yes Type of Wheelchair: Manual Wheelchair activity did not occur: Safety/medical concerns         Wheelchair 50 feet with 2 turns activity    Assist    Wheelchair 50 feet with 2 turns activity did not occur: Safety/medical concerns       Wheelchair 150 feet activity     Assist  Wheelchair 150 feet activity did not occur: Safety/medical concerns       Blood pressure (!) 148/81, pulse 97, temperature 97.8 F (36.6 C), temperature source Oral, resp. rate 18, height '5\' 1"'$  (1.549 m), weight 67.8 kg, SpO2 98 %.  Medical Problem List and Plan: 1. Functional deficits secondary to osteomyelitis,infection of lumbar surgical site complicated by meningitis/ventriculitis.             -pt also with significant deconditioning             -patient may not yet shower             -ELOS/Goals: 10/06/22, Min/Mod A with PT, OT, SLP  -Continue CIR, PT OT SL -3/1 Repeat MRI brain around 3/13 prior to stopping abx per Dr Zada Finders note 2/14   2.  Antithrombotics: -DVT/anticoagulation:   Pharmaceutical: Lovenox '40mg'$  QD             -antiplatelet therapy: N/a  3. Pain Management: Butrans 66mg/hr patch for pain control w/ Flexeril '5mg'$  TID, Tylenol PRN, Oxycodone 7.'5mg'$  q4h PRN, Gabapentin '100mg'$  TID  -09/04/22 increased flexeril to 7.'5mg'$  TID -09/05/22 schedule Oxycodone '5mg'$  q6h and leave 2.'5mg'$  q4h PRN dosing -2/26 tylenol PRN for HA -2/28 Fioricet ordered PRN headache -09/17/22 will change PRN oxycodone 2.'5mg'$   to percocet 5-'325mg'$ , consider increase gabapentin to help with pain and HA- make changes gradually due to hx of encephalopathy  4. Mood/Behavior/Sleep: LCSW to follow for evaluation and support.             --Trazodone '100mg'$  QHS and melatonin '5mg'$  QHS PRN for  insomnia/mood  -09/04/22 placed sign on door to minimize interruptions 11p-5a; changed CBG checks as below to minimize interruptions             -antipsychotic agents:  Luvox '100mg'$  QHS and Lamictal '200mg'$  QD  -Continue Aricept '10mg'$  QD  -Seen by neuropsych-appreciate assistance  -3/1 increase melatonin to '10mg'$  dose  5. Neuropsych/cognition: This patient is not fully capable of making decisions on her own behalf. 6. Skin/Wound Care: WOC to follow for wound VAC changes --MASD managed by fecal system-- flexiseal clogged 09/04/22 AM but nursing states it's now working; monitor--might be able to d/c soon? -2/21 WOC stopped VAC, start aquacel and dressing -see flexiseal discussion below, #17 -2/27 WOC following, slough pulled away revealing 2cm deep defect -2/29 Spoke with Dr. Zada Finders today about wound depth, continue local wound care- continue to monitor, continue abx per ID,  no plan for procedure at this time  7. Fluids/Electrolytes/Nutrition: Monitor I/O. Continue tube feeds-->likely contributing to diarrhea             -monitor intake closely, continue vitamins -2/19 discontinue core track Consult dietician -2/27 decrease BMP to every other day, next 09/19/22  8.Sepsis w/ osteomyelitis/meningitis/encephalitis: Continue  Cefepime 2g q8h, Vanc '1250mg'$  q12h, and Flagyl '500mg'$  q8h for at least 6 weeks --3/1 per ID continue vanco and cefepime through 09/30/22 with plan for suppressive Levaquin and doxycycline with duration to be determined outpatient- f/uw with Dr [name], metronidazole has been discontinued    9. H/o PVT:  Monitor BP/heart rate TID--on coreg 12.'5mg'$  BID, Labetalol '10mg'$  q2h PRN  -09/04/22 BP/HR fairly well controlled, cont regimen, monitor  -2/23  BP has been a little above goal, increase coreg dose to '25mg'$  BID  -09/18/22 well controlled, cont regimen     09/18/2022    6:26 AM 09/17/2022    8:15 PM 09/17/2022    4:18 PM  Vitals with BMI  Systolic [id number]  Diastolic 81 82 89  Pulse 97 90 92      10. Urinary retention: Has failed 4 voiding trails. Has had foley since 12/23             -maintain foley cath, foley mgt per protocol -- Continue Flomax 0.'4mg'$  qPM. Will d/c urecholine.   11. VDRF s/p trach: Tolerating PMSV well -I ordered downsizing to CFS # 4 today--tolerated well -continue ATC w/ pulmonary hygiene -begin day time plugging of trach tomorrow. Decannulate soon. -Trial of capping with respiratory-  can trial 24 hour capping today -2/22 Leave trach uncapped tonight -2/28 plan for decannulation today, has had trach capped for >72hours -2/29 Lurline Idol was  removed - doing well   12. T2DM: Monitor BS ac/hs and use SSI for elevated BS.              --continue tube feeds with supplements between meals due to poor intake             --continue liberalized diet for now. -09/04/22 CBGs looking great, Changed SSI and CBG checks to TID AC + QHS to minimize interruptions overnight, since tube feeds d/c'd -09/18/22 CBGs well controlled overall   CBG (last 3)  Recent Labs    09/18/22 0005  09/18/22 0627 09/18/22 1157  GLUCAP 137* 146* 130*    13. Hypokalemia: Recurrent likely due to diarrhea/intake.              --will add standing dose BID -->recheck labs on 02/17 and 02/19  -09/04/22 K+ 4.0,  continue Klor 77mq BID, monitor on weekly labs -09/05/22 doesn't like Klor packet but no great alternative (Phos-Nak would have to be heavily dosed to get equivalent K+, so only other alternative is IV-- will hold off for now) -2/19 Increase K+ supplement to 457m BID, could consider change to tablet however this is a large tablet and she had trouble swallowing it previously so continue packet for now -2/20, discussed with pharmacy, change to tablet potassium, IV K+ ordered , daily labs -2/23 K+ down to 3.4, increase K+ supplement 2048mfrom BID to TID -2/26 stable at 4.2 -09/18/22 stable K+ 4.2 on 09/17/22, wants to change to BID dosing so switched Klor to 56m48mID (still will have 60mE8mtal/day) 14. Anemia of critical illness: Hgb 7.5-8.9 range -2/26 stable at HGB 10, monitor on weekly labs, next 09/20/22 15. Hyponatremia/hypochloremia: Improving 125/87-->130/94.  -09/04/22 Na 129, Cl 95, both stable, continue salt tabs 1g BID -Cortisol testing done during hospitalization (13.8 on 08/30/22), was normal, but daughter and pt want to make sure this is followed up--defer to weekday team regarding ongoing ?Addison's testing -2/21 129, a little decreased, fluid restriction started -2/28 stable at 131 16. Encephalopathy/Delirium: Continues to have intermittent confusion--but much improved compared to 3 days ago per family.  --Continue delirium precautions. 17. Loose stools likely d/t TF and abx.             -reducing TF as above.  -flexiseal clogged 09/04/22 AM but nursing states it's now working; monitor--might be able to d/c soon? -add probiotics and fiber-- fiber changed to [place] on 09/04/22 since this should cause less clogging of Cortrak -Lomotil 1-2 tabs QID PRN -2/21 improved, rectal tube to help keep wound clean -2/22 continue rectal tube for now -09/11/22 rectal tube dislodged, stools clogging it d/t more formed, d/c for now and see how she does with bedpan, if soiling near wound then  needs replacement of flexiseal -09/12/22 stools type 5-6, not super frequent, using diaper not bedpan-- looks like no stool is getting close to wound, but advised trying bedpan. Advised nursing that if stool becomes more frequent or more type 6-7, then flexiseal would need to be replaced. Monitor closely.  -2/29 LBM today, formed -09/18/22 BMs improving, monitor 18. Hypothyroidism: continue synthroid [id number]. Chronic combined CHF, BLE Swelling: recent 2D echo done 08/20/2022 showed apical hypokinesis, LVEF 40-45%, RV function was reduced. Intermittently diuresed during hospitalization, most recently given '40mg'$  IV lasix on 09/03/22 -09/04/22 noted to still have BLE swelling, start '20mg'$  PO lasix and monitor lytes/BP/swelling -09/18/22  weight appears stable, LE edema improving-continue to monitor Filed Weights   09/14/22 0634 09/15/22 0500 09/16/22 0636  Weight: 66.7 kg 67.7 kg 67.8 kg    20. Oral ulcer  --2/28 Orajel for oral ulcer discomfort  LOS: 15 days A FACE TO FACE [id number], 12:55 PM

## 2022-09-18 NOTE — Progress Notes (Signed)
Earlier in the evening patient reporting difficulty swallowing her Potassium medication. Also, requesting medication be split in two to aide her in swallowing medication. After taking medication did endorse sensation of feeling of the medication being "stuck in my throat". After few sips of water sensation dissipated. Later in the evening after eating and taking nighttime medication having an unwitnessed episode of emesis. Explained patient had vomitus in a cup and no evidence of vomitus outside of thing clear mucus secretions and water. Given emesis bag and endorsed feeling nauseous. Given PRN medication for nausea. Lung sounds assessed and clear no concerns. Mouth and throat assessed. Able to palpate laryngeal protuberance. Patient was able to swallow with slight discomfort. Endorsing discomfort in area where tracheostomy site was when swallowing and bump was assessed only when swallowing over this area.  Daughter of patient also having concerns about medication changes. Patient unable to recall what she did eat for breakfast this morning and having difficulty recalling events that occurred throughout the day today. Also, per the daughter concerned about her mother's respirations at night.  Shelah Lewandowsky, LPN

## 2022-09-19 DIAGNOSIS — A86 Unspecified viral encephalitis: Secondary | ICD-10-CM

## 2022-09-19 DIAGNOSIS — T148XXA Other injury of unspecified body region, initial encounter: Secondary | ICD-10-CM

## 2022-09-19 LAB — BASIC METABOLIC PANEL
Anion gap: 8 (ref 5–15)
BUN: 9 mg/dL (ref 8–23)
CO2: 24 mmol/L (ref 22–32)
Calcium: 9.1 mg/dL (ref 8.9–10.3)
Chloride: 98 mmol/L (ref 98–111)
Creatinine, Ser: <0.3 mg/dL — ABNORMAL LOW (ref 0.44–1.00)
Glucose, Bld: 123 mg/dL — ABNORMAL HIGH (ref 70–99)
Potassium: 4.4 mmol/L (ref 3.5–5.1)
Sodium: 130 mmol/L — ABNORMAL LOW (ref 135–145)

## 2022-09-19 LAB — GLUCOSE, CAPILLARY
Glucose-Capillary: 135 mg/dL — ABNORMAL HIGH (ref 70–99)
Glucose-Capillary: 197 mg/dL — ABNORMAL HIGH (ref 70–99)
Glucose-Capillary: 132 mg/dL — ABNORMAL HIGH (ref 70–99)
Glucose-Capillary: 94 mg/dL (ref 70–99)

## 2022-09-19 NOTE — Progress Notes (Signed)
PROGRESS NOTE   Subjective/Complaints:  Doing well this morning, family and pt have questions about the wound and whether more debridements needs to be performed vs more wound care f/up. Discussed perhaps having wound care nurse reassessing tomorrow, discussed options of Santyl vs medihoney if appropriate (would defer to them/neurosurgeon).  Had some questions about abx plan and f/up for wound.  Nursing yesterday took photos of her wound, slight amount of slough in the proximal portion, comparing photos from a few days ago, not much difference other than maybe more slough.  Slept fairly well last night. Pain overall well controlled. LBM today, still no diarrhea.  Still has foley, no issues with that, knows that she'll likely go home with foley and f/up with urology outpatient.  Otherwise denies other complaints.     ROS:  +HA- intermittent,  + insomnia-improving, +LE swelling-improving. Denies fevers, chills, cough, CP, SOB, abd pain, N/V/C, diarrhea, new/worsening paresthesias/weakness, vision changes or any other complaints at this time.    Objective:   No results found. No results for input(s): "WBC", "HGB", "HCT", "PLT" in the last 72 hours.   Recent Labs    09/17/22 0525 09/19/22 0445  NA 130* 130*  K 4.2 4.4  CL 98 98  CO2 21* 24  GLUCOSE 124* 123*  BUN 7* 9  CREATININE 0.32* <0.30*  CALCIUM 9.4 9.1    Intake/Output Summary (Last 24 hours) at 09/19/2022 1256 Last data filed at 09/19/2022 0846 Gross per 24 hour  Intake 600 ml  Output 5775 ml  Net -5175 ml        Physical Exam: Vital Signs Blood pressure (!) 148/75, pulse 82, temperature 98.1 F (36.7 C), temperature source Oral, resp. rate 18, height '5\' 1"'$  (1.549 m), weight 67.6 kg, SpO2 98 %.  Constitutional:  thin, NAD, sitting up in bed eating and drinking without difficulty HENT: [place]/AT,  MMM  Eyes: EOMI, conjunctiva clear, PERRL Neck:     Comments:  Dry dressing over trach site.  Cardiovascular: RRR, soft murmur heard (VSD), trace BLE swelling-improved Pulmonary: CTAB no w/r/r, no increased WOB Abdominal: Soft, nontender, nondistended, positive bowel sounds/normal active Genitourinary:    Comments:Sacral wound dressing clean-not reassessed this morning but reviewed pics (as below)  foley in place-yellow urine Neurological: Follows commands, memory- decreased at times but stable compared to last week,  Alert and oriented x3, no hypertonia noted  PRIOR EXAM: Musculoskeletal:        General: Swelling present.     Cervical back: Normal range of motion.     Comments: Right leg discrepancy with weakness and leg rotated outward. Dependent edema Right forearm and hand and left forearm.   Skin:    Comments: Well healed old right thigh incision.  Neurological:     Mental Status: She is alert and oriented to person, place, and time.     Comments: Alert and oriented x 3 (needed some help with day) much improved insight and awareness. [place] Memory. Normal language and speech. Cranial nerve exam unremarkable. UE 4/5 prox to distal. RLE 2-/5 prox to 4/5 distally. LLE 2+/5 prox to 4/5 distally. Senses pain in all 4 limbs. No abnl resting tone.  Psychiatric:     appropriate, flat affect   Spine wound 09/18/22:  Spinal Wound 09/13/22:   Assessment/Plan: 1. Functional deficits which require 3+ hours per day of interdisciplinary therapy in a comprehensive inpatient rehab setting. Physiatrist is providing close team supervision and 24 hour management of active medical problems listed below. Physiatrist and rehab team continue to assess barriers to discharge/monitor patient progress toward functional and medical goals  Care Tool:  Bathing  Bathing activity did not occur: Refused           Bathing assist       Upper Body Dressing/Undressing Upper body dressing   What is the patient wearing?: Hospital gown only    Upper body assist Assist  Level: Total Assistance - Patient < 25%    Lower Body Dressing/Undressing Lower body dressing      What is the patient wearing?: Hospital gown only     Lower body assist Assist for lower body dressing: Total Assistance - Patient < 25%     Toileting Toileting Toileting Activity did not occur Landscape architect and hygiene only): N/A (no void or bm) (using catheter and rectal tube)  Toileting assist Assist for toileting: Dependent - Patient 0%     Transfers Chair/bed transfer  Transfers assist  Chair/bed transfer activity did not occur: Safety/medical concerns  Chair/bed transfer assist level: Maximal Assistance - Patient 25 - 49%     Locomotion Ambulation   Ambulation assist   Ambulation activity did not occur: Safety/medical concerns          Walk 10 feet activity   Assist  Walk 10 feet activity did not occur: Safety/medical concerns        Walk 50 feet activity   Assist Walk 50 feet with 2 turns activity did not occur: Safety/medical concerns         Walk 150 feet activity   Assist Walk 150 feet activity did not occur: Safety/medical concerns         Walk 10 feet on uneven surface  activity   Assist Walk 10 feet on uneven surfaces activity did not occur: Safety/medical concerns         Wheelchair     Assist Is the patient using a wheelchair?: Yes Type of Wheelchair: Manual Wheelchair activity did not occur: Safety/medical concerns         Wheelchair 50 feet with 2 turns activity    Assist    Wheelchair 50 feet with 2 turns activity did not occur: Safety/medical concerns       Wheelchair 150 feet activity     Assist  Wheelchair 150 feet activity did not occur: Safety/medical concerns       Blood pressure (!) 148/75, pulse 82, temperature 98.1 F (36.7 C), temperature source Oral, resp. rate 18, height '5\' 1"'$  (1.549 m), weight 67.6 kg, SpO2 98 %.  Medical Problem List and Plan: 1. Functional deficits  secondary to osteomyelitis, infection of lumbar surgical site complicated by meningitis/ventriculitis.             -pt also with significant deconditioning             -patient may not yet shower             -ELOS/Goals: 10/06/22, Min/Mod A with PT, OT, SLP  -Continue CIR, PT OT SL -3/1 Repeat MRI brain around 09/29/22 prior to stopping abx per Dr Zada Finders note 09/01/22   2.  Antithrombotics: -DVT/anticoagulation:  Pharmaceutical: Lovenox '40mg'$  QD             -  antiplatelet therapy: N/a  3. Pain Management: Butrans 51mg/hr patch for pain control w/ Flexeril '5mg'$  TID, Tylenol PRN, Oxycodone 7.'5mg'$  q4h PRN, Gabapentin '100mg'$  TID  -09/04/22 increased flexeril to 7.'5mg'$  TID -09/05/22 schedule Oxycodone '5mg'$  q6h and leave 2.'5mg'$  q4h PRN dosing -2/26 tylenol PRN for HA -2/28 Fioricet ordered PRN headache -09/17/22 will change PRN oxycodone 2.'5mg'$  to percocet 5-'325mg'$ , consider increase gabapentin to help with pain and HA- make changes gradually due to hx of encephalopathy  4. Mood/Behavior/Sleep: LCSW to follow for evaluation and support.             --Trazodone '100mg'$  QHS and melatonin '5mg'$  QHS PRN for  insomnia/mood  -09/04/22 placed sign on door to minimize interruptions 11p-5a; changed CBG checks as below to minimize interruptions             -antipsychotic agents:  Luvox '100mg'$  QHS and Lamictal '200mg'$  QD  -Continue Aricept '10mg'$  QD  -Seen by neuropsych-appreciate assistance  -3/1 increase melatonin to '10mg'$  dose QHS PRN  5. Neuropsych/cognition: This patient is not fully capable of making decisions on her own behalf. 6. Skin/Wound Care: WOC to follow for wound VAC changes --MASD managed by fecal system-- flexiseal clogged 09/04/22 AM but nursing states it's now working; monitor--might be able to d/c soon? -2/21 WOC stopped VAC, start aquacel and dressing -see flexiseal discussion below, #17 -2/27 WOC following, slough pulled away revealing 2cm deep defect -2/29 Spoke with Dr. OZada Finderstoday about wound  depth, continue local wound care- continue to monitor, continue abx per ID,  no plan for procedure at this time -09/19/22 discussed with pt/family, they're concerned this needs further debridement; wound similar to pic from 09/13/22 on daughter's phone, but agree that it may be reasonable to have wound care nurse reassess this wound tomorrow Monday 09/20/22 if possible, to avoid worsening of wound since it's perhaps getting slightly more slough; ?Santyl may be helpful--defer to weekday team  7. Fluids/Electrolytes/Nutrition: Monitor I/O. Continue tube feeds-->likely contributing to diarrhea             -monitor intake closely, continue vitamins -2/19 discontinue core track, Consult dietician -2/27 decrease BMP to every other day -09/19/22 BMP stable, still with hyponatremia 130 (see #15); monitor  8.Sepsis w/ osteomyelitis/meningitis/encephalitis: Continue Cefepime 2g q8h, Vanc '1250mg'$  q12h, and Flagyl '500mg'$  q8h for at least 6 weeks --3/1 per ID continue Vanc+Cefepime until 09/30/22 then switch to suppressive PO Levaquin '750mg'$  QD + Doxycycline '100mg'$  BID until f/up outpatient, Dr. VTommy Medalappt 10/11/22 at 2pm; metronidazole has been discontinued as of 09/16/22 (was primarily for PNA, no longer needed)  9. H/o PVT:  Monitor BP/heart rate TID--on coreg 12.'5mg'$  BID, Labetalol '10mg'$  q2h PRN  -09/04/22 BP/HR fairly well controlled, cont regimen, monitor  -2/23  BP has been a little above goal, increase coreg dose to '25mg'$  BID  -09/19/22 well controlled, cont regimen     09/19/2022    6:37 AM 09/19/2022    5:15 AM 09/18/2022    7:35 PM  Vitals with BMI  Weight 149 lbs 1 oz    BMI 20000000   Systolic  [id number] Diastolic  75 86  Pulse  82 87      10. Urinary retention: Has failed 4 voiding trails. Has had foley since 12/23             -maintain foley cath, foley mgt per protocol -- Continue Flomax 0.'4mg'$  qPM. Will d/c urecholine.   11. VDRF s/p trach: Tolerating PMSV well -I ordered  downsizing to CFS # 4  today--tolerated well -continue ATC w/ pulmonary hygiene -begin day time plugging of trach tomorrow. Decannulate soon. -Trial of capping with respiratory-  can trial 24 hour capping today -2/22 Leave trach uncapped tonight -2/28 plan for decannulation today, has had trach capped for >72hours -2/29 Lurline Idol was  removed - doing well   12. T2DM: Monitor BS ac/hs and use SSI for elevated BS.              --continue tube feeds with supplements between meals due to poor intake             --continue liberalized diet for now. -09/04/22 CBGs looking great, Changed SSI and CBG checks to TID AC + QHS to minimize interruptions overnight, since tube feeds d/c'd -09/19/22 CBGs well controlled overall   CBG (last 3)  Recent Labs    09/18/22 2059 09/19/22 0631 09/19/22 1141  GLUCAP 129* 135* 197*    13. Hypokalemia: Recurrent likely due to diarrhea/intake.              --will add standing dose BID -->recheck labs on 02/17 and 02/19  -09/04/22 K+ 4.0, continue Klor 64mq BID, monitor on weekly labs -09/05/22 doesn't like Klor packet but no great alternative (Phos-Nak would have to be heavily dosed to get equivalent K+, so only other alternative is IV-- will hold off for now) -2/19 Increase K+ supplement to 483m BID, could consider change to tablet however this is a large tablet and she had trouble swallowing it previously so continue packet for now -2/20, discussed with pharmacy, change to tablet potassium, IV K+ ordered , daily labs -2/23 K+ down to 3.4, increase K+ supplement 2065mfrom BID to TID -2/26 stable at 4.2 -09/18/22 stable K+ 4.2 on 09/17/22, wants to change to BID dosing so switched Klor to 3m75mID (still will have 60mE70mtal/day)  -09/19/22 K+ stable 4.4, cont regimen  14. Anemia of critical illness: Hgb 7.5-8.9 range -2/26 stable at HGB 10, monitor on weekly labs, next 09/20/22  15. Hyponatremia/hypochloremia: Improving 125/87-->130/94.  -09/04/22 Na 129, Cl 95, both stable, continue salt  tabs 1g BID -Cortisol testing done during hospitalization (13.8 on 08/30/22), was normal, but daughter and pt want to make sure this is followed up--defer to weekday team regarding ongoing ?Addison's testing -2/21 129, a little decreased, fluid restriction started -2/28 stable at 131 -09/19/22 Na stable at 130, Cl normalized; monitor on q48h BMPs  16. Encephalopathy/Delirium: Continues to have intermittent confusion--but much improved compared to 3 days ago per family.  --Continue delirium precautions.  17. Loose stools likely d/t TF and abx.             -reducing TF as above.  -flexiseal clogged 09/04/22 AM but nursing states it's now working; monitor--might be able to d/c soon? -add probiotics and fiber-- fiber changed to [employer]ri[employer] White[employer] on 09/04/22 since this should cause less clogging of Cortrak -Lomotil 1-2 tabs QID PRN -2/21 improved, rectal tube to help keep wound clean -2/22 continue rectal tube for now -09/11/22 rectal tube dislodged, stools clogging it d/t more formed, d/c for now and see how she does with bedpan, if soiling near wound then needs replacement of flexiseal -09/12/22 stools type 5-6, not super frequent, using diaper not bedpan-- looks like no stool is getting close to wound, but advised trying bedpan. Advised nursing that if stool becomes more frequent or more type 6-7, then flexiseal would need to be replaced. Monitor closely.  -2/29 LBM  today, formed -09/18/22 BMs improving, monitor  18. Hypothyroidism: continue synthroid 86mg QD  19. Chronic combined CHF, BLE Swelling: recent 2D echo done 08/20/2022 showed apical hypokinesis, LVEF 40-45%, RV function was reduced. Intermittently diuresed during hospitalization, most recently given '40mg'$  IV lasix on 09/03/22 -09/04/22 noted to still have BLE swelling, start '20mg'$  PO lasix and monitor lytes/BP/swelling -09/18/22  weight appears stable, LE edema improving-continue to monitor Filed Weights   09/16/22 0636 09/18/22 1322 09/19/22  0637  Weight: 67.8 kg 66.1 kg 67.6 kg    20. Oral ulcer  --2/28 Orajel for oral ulcer discomfort  I spent >530ms with patient and her family, due to discussions regarding wound management, abx plan, outpatient f/up plans, repeat MRI plans, and documentation of discussions.   LOS: 16 days A FACE TO FABig [place]/09/2022, 12:56 PM

## 2022-09-19 NOTE — Progress Notes (Signed)
Occupational Therapy Session Note  Patient Details  Name: [name] MRN: [id number] Date of Birth: [birth year]  Today's Date: 09/19/2022 OT Individual Time: 1400-1445 OT Individual Time Calculation (min): 45 min    Short Term Goals: Week 3:  OT Short Term Goal 1 (Week 3): Pt will complete sit <> stand with max A of 1 consistently OT Short Term Goal 2 (Week 3): Pt will complete bed mobility to EOB with mod A OT Short Term Goal 3 (Week 3): Pt will complete stand pivot transfer with max A consistently  Skilled Therapeutic Interventions/Progress Updates: Patient's daughter present for treatment session. Assisted patient with log rolling to change brief. Patient able to roll and stay to each side with only minimal assist to maintain safe log roll. Followed with transition from side lying to sitting EOB with max assist of one to maintain proper spinal alignment. Patient able to sit EOB with only supervision and UE s in support on EOB. Scooting forward to get feet flat on floor max assist working on side at a time with education of use of wt shift and abdominal assist to safely move/prevent twisting. Attempted scooting towards [facility] with focus on LE support and lift off the mattress. Attempted x 3 with slight scoot towards R/HOB, but unable to lift clear mattress with small scoots. Continued OT with transition from EOB to sidelying -Max assist and side to roll supine with mod max guidance. Patient engaged and participatory. Motivated to return home. Discussed ways to wash hair from w/c level as part of grooming tasks while unable to get into the shower fully. Continue with skilled OT POC to work on functional mobility and transfers as well as basic self care.     Therapy Documentation Precautions:  Precautions Precautions: Fall, Back Precaution Comments: trach, rectal tube, foley Required Braces or Orthoses: Other Brace Other Brace: prevlon boots Restrictions Weight Bearing Restrictions:  No Other Position/Activity Restrictions: external rotation R hip at baseline at all times, RLE shorter than LLE, pt ambulated household distances PTA, w/c for community  Pain: Pain Assessment Pain Scale: 0-10 Pain Score: 6  Pain Type: Acute pain Pain Location: Back Pain Orientation: Lower Pain Descriptors / Indicators: Aching Pain Frequency: Constant Pain Onset: Gradual Pain Intervention(s): Medication (See eMAR)   Therapy/Group: Individual Therapy  Hermina Barters 09/19/2022, 1:57 PM

## 2022-09-19 NOTE — Progress Notes (Signed)
Speech Language Pathology Daily Session Note  Patient Details  Name: [name] MRN: [id number] Date of Birth: Aug 09, 1955  Today's Date: 09/19/2022 SLP Individual Time: 1315-1405 SLP Individual Time Calculation (min): 50 min  Short Term Goals: Week 3: SLP Short Term Goal 1 (Week 3): Pt will use external memory aids to recall important information with min A verbal cues. SLP Short Term Goal 2 (Week 3): Pt will demonstrate awareness of errors and repair with min-to-modA verbal cues. SLP Short Term Goal 3 (Week 3): Pt will participate in therapeutic task for 10 minute intervals with min A verbal cues. SLP Short Term Goal 4 (Week 3): Patient will complete functional and semi-complex problem solving tasks with min A verbal cues  Skilled Therapeutic Interventions:  Pt was seen for skilled ST targeting cognitive goals.  Session began late as pt was with OT upon therapist's arrival.  Pt with complaints of back pain but daughter reports that pt was premedicated prior to pt's arrival.  Pt otherwise awake, alert, and agreeable to participating in treatment.  SLP facilitated the session with a previously taught card game to address recall and problem solving goals.  Pt needed mod verbal cues to recall rules and procedures for the game but was able to plan and execute a problem solving strategy with no more than min assist verbal cues.  Pt reports that she is still struggling with memory and concentration; therefore, SLP provided skilled education regarding the cognitive hierarchy and how deficits in the more basic cognitive skills can impact all higher level processes, especially in regards to attention and memory.  SLP briefly discussed distraction management techniques, emphasizing addressing bodily intrinsic distractors such as pain, toileting needs, discomfort, fatigue etc and minimizing external distractions when able.  All questions were answered to pt's and daughter's satisfaction at this time.  Pt  left in bed with family at bedside.  Continue per current plan of care.    Pain Pain Assessment Pain Scale: Faces Pain Score: 7  Faces Pain Scale: Hurts little more Pain Type: Acute pain Pain Location: Back Pain Orientation: Lower Pain Descriptors / Indicators: Other (Comment) (premedicated prior to therapist's arrival)   Therapy/Group: Individual Therapy  Britni Driscoll, Selinda Orion 09/19/2022, 2:09 PM

## 2022-09-20 DIAGNOSIS — T8130XA Disruption of wound, unspecified, initial encounter: Secondary | ICD-10-CM

## 2022-09-20 LAB — CBC
HCT: 31.7 % — ABNORMAL LOW (ref 36.0–46.0)
Hemoglobin: 10.1 g/dL — ABNORMAL LOW (ref 12.0–15.0)
MCH: 31.5 pg (ref 26.0–34.0)
MCHC: 31.9 g/dL (ref 30.0–36.0)
MCV: 98.8 fL (ref 80.0–100.0)
Platelets: 419 10*3/uL — ABNORMAL HIGH (ref 150–400)
RBC: 3.21 MIL/uL — ABNORMAL LOW (ref 3.87–5.11)
RDW: 17 % — ABNORMAL HIGH (ref 11.5–15.5)
WBC: 7.3 10*3/uL (ref 4.0–10.5)
nRBC: 0 % (ref 0.0–0.2)

## 2022-09-20 LAB — GLUCOSE, CAPILLARY
Glucose-Capillary: 85 mg/dL (ref 70–99)
Glucose-Capillary: 166 mg/dL — ABNORMAL HIGH (ref 70–99)
Glucose-Capillary: 104 mg/dL — ABNORMAL HIGH (ref 70–99)
Glucose-Capillary: 105 mg/dL — ABNORMAL HIGH (ref 70–99)

## 2022-09-20 LAB — VANCOMYCIN, TROUGH: Vancomycin Tr: 17 ug/mL (ref 15–20)

## 2022-09-20 MED ORDER — MELATONIN 5 MG PO TABS
5.0000 mg | ORAL_TABLET | Freq: Every evening | ORAL | Status: DC | PRN
  Administered 2022-09-20 – 2022-10-11 (×10): 5 mg via ORAL
  Filled 2022-09-20 (×10): qty 1

## 2022-09-20 NOTE — Progress Notes (Signed)
Speech Language Pathology Daily Session Note  Patient Details  Name: [name] MRN: [id number] Date of Birth: 04-03-1956  Today's Date: 09/20/2022 SLP Individual Time: 0900-1000 SLP Individual Time Calculation (min): 60 min  Short Term Goals: Week 3: SLP Short Term Goal 1 (Week 3): Pt will use external memory aids to recall important information with min A verbal cues. SLP Short Term Goal 2 (Week 3): Pt will demonstrate awareness of errors and repair with min-to-modA verbal cues. SLP Short Term Goal 3 (Week 3): Pt will participate in therapeutic task for 10 minute intervals with min A verbal cues. SLP Short Term Goal 4 (Week 3): Patient will complete functional and semi-complex problem solving tasks with min A verbal cues  Skilled Therapeutic Interventions: Skilled ST treatment focused on cognitive goals. Pt was greeted in bed on arrival and accompanied by her daughter. Pt oriented to all concepts with sup A verbal cues for use of external aid to orient to date. Pt engaged in functional discussion with SLP regarding cognitive deficits with continued improvement with intellectual and emergent awareness of deficits. Pt was very open to having a discussion and asking both SLP and MD questions pertaining to her cognition and memory. Pt receptive to using memory compensations, particularly with use of memory notebook. Daughter to assist with facilitating daily schedule today (therapy schedule + pain medication schedule). Pt continues to ask the same question throughout session (e.g., when can I get pain medication again?) but much less frequently than at [facility]. Pt sustained attention throughout duration of session with min A verbal redirection cues mainly surrounding external distractions. Wound care arrived and SLP facilitated with bed mobility so wound could be assessed. Pt with improving bed mobility and motivation to initiate. Daughter validated pt's performance and recent improvements. Patient  was left in bed with alarm activated and immediate needs within reach at end of session. Continue per current plan of care.      Pain Pain Assessment Pain Scale: 0-10 Pain Score: 8  Pain Type: Acute pain Pain Location: Back Pain Orientation: Lower Pain Descriptors / Indicators: Aching Pain Frequency: Constant Pain Onset: Progressive Pain Intervention(s): Medication (See eMAR)  Therapy/Group: Individual Therapy  [name] 09/20/2022, 10:53 AM

## 2022-09-20 NOTE — Progress Notes (Addendum)
Pt prefers to have her CHG/foley care done when she's fully awake in the morning. Will inform day shift.   CHG bath time changed from 0500 to 0800 per pharmacy. Pt and Pt's daughter is aware and are very happy with the change.

## 2022-09-20 NOTE — Progress Notes (Signed)
PROGRESS NOTE   Subjective/Complaints:  Pt has questions about her wound care, [place] nursing came to discuss today.-continue aquacel.  She continues to have memory deficits. Family would like melatonin decreased back down to '5mg'$  daily.      ROS:  +HA- intermittent,  + insomnia-improving, +LE swelling-improving + decreased memory. Denies fevers, chills, cough, CP, SOB, abd pain, N/V/C, diarrhea, new/worsening paresthesias/weakness, vision changes or any other complaints at this time.    Objective:   No results found. Recent Labs    09/20/22 0344  WBC 7.3  HGB 10.1*  HCT 31.7*  PLT 419*     Recent Labs    09/19/22 0445  NA 130*  K 4.4  CL 98  CO2 24  GLUCOSE 123*  BUN 9  CREATININE <0.30*  CALCIUM 9.1     Intake/Output Summary (Last 24 hours) at 09/20/2022 0825 Last data filed at 09/20/2022 0612 Gross per 24 hour  Intake 118 ml  Output 7075 ml  Net -6957 ml         Physical Exam: Vital Signs Blood pressure 137/75, pulse 85, temperature 98.8 F (37.1 C), temperature source Oral, resp. rate 18, height '5\' 1"'$  (1.549 m), weight 69.3 kg, SpO2 95 %.  Constitutional:  thin, NAD HENT: [place]/AT,  MMM  Eyes: EOMI, conjunctiva clear, PERRL Neck:     Comments: Dry dressing over trach site.  Cardiovascular: RRR, soft murmur heard (VSD), trace BLE swelling-improved Pulmonary: CTAB no w/r/r, no increased WOB Abdominal: Soft, nontender, nondistended, positive bowel sounds/normal active Genitourinary:    Comments:Sacral wound dressing clean  foley in place-yellow urine Neurological: Follows commands, memory- decreased at times but stable compared to last week,  Alert and oriented x3, no hypertonia noted, CN 2-12 grossly intact Psychiatric:     appropriate, normal affect     Assessment/Plan: 1. Functional deficits which require 3+ hours per day of interdisciplinary therapy in a comprehensive inpatient rehab  setting. Physiatrist is providing close team supervision and 24 hour management of active medical problems listed below. Physiatrist and rehab team continue to assess barriers to discharge/monitor patient progress toward functional and medical goals  Care Tool:  Bathing  Bathing activity did not occur: Refused           Bathing assist       Upper Body Dressing/Undressing Upper body dressing   What is the patient wearing?: Hospital gown only    Upper body assist Assist Level: Total Assistance - Patient < 25%    Lower Body Dressing/Undressing Lower body dressing      What is the patient wearing?: Hospital gown only     Lower body assist Assist for lower body dressing: Total Assistance - Patient < 25%     Toileting Toileting Toileting Activity did not occur Landscape architect and hygiene only): N/A (no void or bm) (using catheter and rectal tube)  Toileting assist Assist for toileting: Dependent - Patient 0%     Transfers Chair/bed transfer  Transfers assist  Chair/bed transfer activity did not occur: Safety/medical concerns  Chair/bed transfer assist level: Maximal Assistance - Patient 25 - 49%     Locomotion Ambulation   Ambulation assist   Ambulation  activity did not occur: Safety/medical concerns          Walk 10 feet activity   Assist  Walk 10 feet activity did not occur: Safety/medical concerns        Walk 50 feet activity   Assist Walk 50 feet with 2 turns activity did not occur: Safety/medical concerns         Walk 150 feet activity   Assist Walk 150 feet activity did not occur: Safety/medical concerns         Walk 10 feet on uneven surface  activity   Assist Walk 10 feet on uneven surfaces activity did not occur: Safety/medical concerns         Wheelchair     Assist Is the patient using a wheelchair?: Yes Type of Wheelchair: Manual Wheelchair activity did not occur: Safety/medical concerns          Wheelchair 50 feet with 2 turns activity    Assist    Wheelchair 50 feet with 2 turns activity did not occur: Safety/medical concerns       Wheelchair 150 feet activity     Assist  Wheelchair 150 feet activity did not occur: Safety/medical concerns       Blood pressure 137/75, pulse 85, temperature 98.8 F (37.1 C), temperature source Oral, resp. rate 18, height '5\' 1"'$  (1.549 m), weight 69.3 kg, SpO2 95 %.  Medical Problem List and Plan: 1. Functional deficits secondary to osteomyelitis, infection of lumbar surgical site complicated by meningitis/ventriculitis.             -pt also with significant deconditioning             -patient may not yet shower             -ELOS/Goals: 10/06/22, Min/Mod A with PT, OT, SLP  -Continue CIR, PT OT SL -3/1 Repeat MRI brain around 09/29/22 prior to stopping abx per Dr Zada Finders note 09/01/22   2.  Antithrombotics: -DVT/anticoagulation:  Pharmaceutical: Lovenox '40mg'$  QD             -antiplatelet therapy: N/a  3. Pain Management: Butrans 43mg/hr patch for pain control w/ Flexeril '5mg'$  TID, Tylenol PRN, Oxycodone 7.'5mg'$  q4h PRN, Gabapentin '100mg'$  TID  -09/04/22 increased flexeril to 7.'5mg'$  TID -09/05/22 schedule Oxycodone '5mg'$  q6h and leave 2.'5mg'$  q4h PRN dosing -2/26 tylenol PRN for HA -2/28 Fioricet ordered PRN headache -09/17/22 will change PRN oxycodone 2.'5mg'$  to percocet 5-'325mg'$ , consider increase gabapentin to help with pain and HA- make changes gradually due to hx of encephalopathy  4. Mood/Behavior/Sleep: LCSW to follow for evaluation and support.             --Trazodone '100mg'$  QHS and melatonin '5mg'$  QHS PRN for  insomnia/mood  -09/04/22 placed sign on door to minimize interruptions 11p-5a; changed CBG checks as below to minimize interruptions             -antipsychotic agents:  Luvox '100mg'$  QHS and Lamictal '200mg'$  QD  -Continue Aricept '10mg'$  QD  -Seen by neuropsych-appreciate assistance  -3/1 increase melatonin to '10mg'$  dose QHS PRN  -3/4  decrease melatonin back to '5mg'$  dose QHS PRN  5. Neuropsych/cognition: This patient is not fully capable of making decisions on her own behalf. 6. Skin/Wound Care: WOC to follow for wound VAC changes --MASD managed by fecal system-- flexiseal clogged 09/04/22 AM but nursing states it's now working; monitor--might be able to d/c soon? -2/21 WOC stopped VAC, start aquacel and dressing -see flexiseal discussion below, #17 -2/27  WOC following, slough pulled away revealing 2cm deep defect -2/29 Spoke with Dr. Zada Finders today about wound depth, continue local wound care- continue to monitor, continue abx per ID,  no plan for procedure at this time -3/4 Seen by [place]- continue aquacel, abd pad/tape, apprecaite assistance  7. Fluids/Electrolytes/Nutrition: Monitor I/O. Continue tube feeds-->likely contributing to diarrhea             -monitor intake closely, continue vitamins -2/19 discontinue core track, Consult dietician -2/27 decrease BMP to every other day -09/19/22 BMP stable, still with hyponatremia 130 (see #15); monitor  8.Sepsis w/ osteomyelitis/meningitis/encephalitis: Continue Cefepime 2g q8h, Vanc '1250mg'$  q12h, and Flagyl '500mg'$  q8h for at least 6 weeks --3/1 per ID continue Vanc+Cefepime until 09/30/22 then switch to suppressive PO Levaquin '750mg'$  QD + Doxycycline '100mg'$  BID until f/up outpatient, Dr. [name] appt 10/11/22 at 2pm; metronidazole has been discontinued as of 09/16/22 (was primarily for PNA, no longer needed)  9. H/o PVT:  Monitor BP/heart rate TID--on coreg 12.'5mg'$  BID, Labetalol '10mg'$  q2h PRN  -09/04/22 BP/HR fairly well controlled, cont regimen, monitor  -2/23  BP has been a little above goal, increase coreg dose to '25mg'$  BID  -3/5 Intermittently mildly elevated, monitor tend     09/20/2022    6:05 AM 09/20/2022    6:01 AM 09/19/2022    8:36 PM  Vitals with BMI  Weight 152 lbs 12 oz    BMI [id number]    Systolic  0000000 0000000  Diastolic  75 91  Pulse  85 83      10. Urinary  retention: Has failed 4 voiding trails. Has had foley since 12/23             -maintain foley cath, foley mgt per protocol -- Continue Flomax 0.'4mg'$  qPM. Will d/c urecholine.   11. VDRF s/p trach: Tolerating PMSV well -I ordered downsizing to CFS # 4 today--tolerated well -continue ATC w/ pulmonary hygiene -begin day time plugging of trach tomorrow. Decannulate soon. -Trial of capping with respiratory-  can trial 24 hour capping today -2/22 Leave trach uncapped tonight -2/28 plan for decannulation today, has had trach capped for >72hours -2/29 Lurline Idol was  removed - doing well   12. T2DM: Monitor BS ac/hs and use SSI for elevated BS.              --continue tube feeds with supplements between meals due to poor intake             --continue liberalized diet for now. -09/04/22 CBGs looking great, Changed SSI and CBG checks to TID AC + QHS to minimize interruptions overnight, since tube feeds d/c'd -3/5 well controlled   CBG (last 3)  Recent Labs    09/19/22 1637 09/19/22 2106 09/20/22 0556  GLUCAP 132* 94 85     13. Hypokalemia: Recurrent likely due to diarrhea/intake.              --will add standing dose BID -->recheck labs on 02/17 and 02/19  -09/04/22 K+ 4.0, continue Klor 76mq BID, monitor on weekly labs -09/05/22 doesn't like Klor packet but no great alternative (Phos-Nak would have to be heavily dosed to get equivalent K+, so only other alternative is IV-- will hold off for now) -2/19 Increase K+ supplement to 444m BID, could consider change to tablet however this is a large tablet and she had trouble swallowing it previously so continue packet for now -2/20, discussed with pharmacy, change to tablet potassium, IV K+ ordered , daily  labs -2/23 K+ down to 3.4, increase K+ supplement 19mq from BID to TID -2/26 stable at 4.2 -09/18/22 stable K+ 4.2 on 09/17/22, wants to change to BID dosing so switched Klor to 361m BID (still will have 6072mtotal/day)  -09/19/22 K+ stable 4.4, cont  regimen  14. Anemia of critical illness: Hgb 7.5-8.9 range -2/26 stable at HGB 10, monitor on weekly labs, next 09/20/22  15. Hyponatremia/hypochloremia: Improving 125/87-->130/94.  -09/04/22 Na 129, Cl 95, both stable, continue salt tabs 1g BID -Cortisol testing done during hospitalization (13.8 on 08/30/22), was normal, but daughter and pt want to make sure this is followed up--defer to weekday team regarding ongoing ?Addison's testing -2/21 129, a little decreased, fluid restriction started -2/28 stable at 131 -09/19/22 Na stable at 130, Cl normalized; monitor on q48h BMPs  16. Encephalopathy/Delirium: Continues to have intermittent confusion--but much improved compared to 3 days ago per family.  --Continue delirium precautions.  17. Loose stools likely d/t TF and abx.             -reducing TF as above.  -flexiseal clogged 09/04/22 AM but nursing states it's now working; monitor--might be able to d/c soon? -add probiotics and fiber-- fiber changed to [address] on 09/04/22 since this should cause less clogging of Cortrak -Lomotil 1-2 tabs QID PRN -2/21 improved, rectal tube to help keep wound clean -2/22 continue rectal tube for now -09/11/22 rectal tube dislodged, stools clogging it d/t more formed, d/c for now and see how she does with bedpan, if soiling near wound then needs replacement of flexiseal -09/12/22 stools type 5-6, not super frequent, using diaper not bedpan-- looks like no stool is getting close to wound, but advised trying bedpan. Advised nursing that if stool becomes more frequent or more type 6-7, then flexiseal would need to be replaced. Monitor closely.  -2/29 LBM today, formed -3/4, LBM yesterday, type 4, improved  18. Hypothyroidism: continue synthroid 61m25mD  19. Chronic combined CHF, BLE Swelling: recent 2D echo done 08/20/2022 showed apical hypokinesis, LVEF 40-45%, RV function was reduced. Intermittently diuresed during hospitalization, most recently given '40mg'$   IV lasix on 09/03/22 -09/04/22 noted to still have BLE swelling, start '20mg'$  PO lasix and monitor lytes/BP/swelling -3/5 wt a little up, [facility]   09/18/22 1322 09/19/22 0637 09/20/22 0605  Weight: 66.1 kg 67.6 kg 69.3 kg    20. Oral ulcer  --2/28 Orajel for oral ulcer discomfort  LOS: 17 days A FACE TO FACEBlanchard/2024, 8:25 AM

## 2022-09-20 NOTE — Progress Notes (Signed)
Occupational Therapy Session Note  Patient Details  Name: [name] MRN: [id number] Date of Birth: 1955-12-20  Today's Date: 09/20/2022 OT Individual Time: 1300-1411 OT Individual Time Calculation (min): 71 min    Short Term Goals: Week 3:  OT Short Term Goal 1 (Week 3): Pt will complete sit <> stand with max A of 1 consistently OT Short Term Goal 2 (Week 3): Pt will complete bed mobility to EOB with mod A OT Short Term Goal 3 (Week 3): Pt will complete stand pivot transfer with max A consistently  Skilled Therapeutic Interventions/Progress Updates:    Pt received supine with no c/o pain, agreeable to OT session. She came to EOB with max A, requiring assist for LE and trunk management. She sat EOB with (S). She stood from EOB with heavy max A, requiring facilitation for terminal hip extension and upright trunk. Pants donned total A. Max A stand pivot to the TIS w/c with improvement in stepping bilaterally. She was very excited to go outside and get some fresh air. She was taken via w/c outside hospital. She sat in the sun for several minutes for psychosocial adjustment and improvement in self efficacy. She then worked on standing outside, completing several trials with bilateral HHA to facilitate anterior weight shift and promote increased independence with ADL transfers. She was taken back inside following. She completed standing at the sink for brief change d/t the one she was wearing being too tight. She completed sit > stand with max A and required total A for brief change. Max A stand pivot transfer to the bed. Max +2 assist to come to supine to adhere to spinal precautions. OT provided gentle BLE stretch to promote heel cord flexibility as well as mobility at the hips. She was left supine with all needs met. Daughter present.   Therapy Documentation Precautions:  Precautions Precautions: Fall, Back Precaution Comments: trach, rectal tube, foley Required Braces or Orthoses: Other  Brace Other Brace: prevlon boots Restrictions Weight Bearing Restrictions: No Other Position/Activity Restrictions: external rotation R hip at baseline at all times, RLE shorter than LLE, pt ambulated household distances PTA, w/c for community  Therapy/Group: Individual Therapy  Curtis Sites 09/20/2022, 6:05 AM

## 2022-09-20 NOTE — Consult Note (Signed)
[place] Nurse wound follow up Wound type: lumbar dehiscence. Post op infection Measurement: proximal:  1 cm x 0.3 cm x 2 cm  slough is larger today, depth remains the same Distal end is healing well:  4 cm x 0.1 cm x 0.1 cm  Wound bed: cannot visualize Drainage (amount, consistency, odor) minimal serosanguinous Periwound: intact  healed incision Dressing procedure/placement/frequency: Nursing staff must pack the depth of proximal end, cut skinny long strip, leave tail extending from defect.  Cover open areas with aquacel and ABD pad/tape  Change daily.  Will see twice weekly to monitor.   Estrellita Ludwig MSN, RN, FNP-BC CWON Wound, Ostomy, Continence Nurse [facility] [phone] Pager [phone]

## 2022-09-20 NOTE — Progress Notes (Signed)
Pharmacy Antibiotic Note   [name] is a 67 y.o. 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   Continues on Vancomycin and Cefepime    Vancomycin trough 17 mcg/mL (09/20/2022).  Renal function remains stable.  Afebrile, WBC WNL. Next Vanco trough 09/27/2022   Plan: Continue vanc '1000mg'$  IV Q12H Continue Cefepime 2g IV Q8H and Flagyl '500mg'$  IV Q8H Monitor renal function, culture results, weekly vanc trough  Thank you for involving pharmacy in this patient's care.  Vaughan Basta BS, PharmD, BCPS Clinical Pharmacist 09/20/2022 6:57 AM  Contact: [phone] after 3 PM  "Be curious, not judgmental..." -Jamal Maes

## 2022-09-20 NOTE — Progress Notes (Signed)
Physical Therapy Session Note  Patient Details  Name: [name] MRN: [id number] Date of Birth: August 30, 1955  Today's Date: 09/20/2022 PT Individual Time: 1002-1058 PT Individual Time Calculation (min): 56 min   Short Term Goals: Week 2:  PT Short Term Goal 1 (Week 2): Pt will perform bed mobility with ModA +1. PT Short Term Goal 2 (Week 2): Pt will perform sit<>stand with MaxA +1. PT Short Term Goal 3 (Week 2): Pt will perform seat-to-seat transfers with MaxA +1 PT Short Term Goal 4 (Week 2): Pt with perform standing balance with alternating bil/single UE support for 1-2 minutes. PT Short Term Goal 5 (Week 2): Pt will initiate pre-gait training.  Skilled Therapeutic Interventions/Progress Updates:     Pt received semi reclined and agrees to therapy. Reports pain in back. PT provides mobility, repositioning, and rest breaks to manage pain. Pt requesting to have bowel movement on BSC. Supiine to R sidelying with modA and cues for body mechanics and sequencing. MaxA required for sidelying to sitting with PT facilitating at trunk and pelvis. Pt able to remain sitting at EOB with closer supervision as BSC and [name] set up for transfer. Pt performs sit to stand to stedy with maxA +2 and cues for initiation, hand placement, and sequencing. [name] transfer to toilet. Pt is continent of bowel and performs sit to stand to [place] with maxA +2. Pt remains standing while PT performs pericare. Pt requires several seated rest breaks in high perched position while pericare is completed and new brief is donned. Pt transfers to [facility] via stedy following toileting and hygiene. Pt taken to gym in tilt in space WC and sits at window in upright position, without back support to promote improved posture and engaged core. WC transport back to room. Stand pivot to bed with maxA +1. Sit to supine with maxA +2 and cues for logrolling. Pt let supine in bed with all needs within reach.   Therapy Documentation Precautions:   Precautions Precautions: Fall, Back Precaution Comments: trach, rectal tube, foley Required Braces or Orthoses: Other Brace Other Brace: prevlon boots Restrictions Weight Bearing Restrictions: No Other Position/Activity Restrictions: external rotation R hip at baseline at all times, RLE shorter than LLE, pt ambulated household distances PTA, w/c for community   Therapy/Group: Individual Therapy  Breck Coons, PT, DPT 09/20/2022, 4:42 PM

## 2022-09-21 LAB — BASIC METABOLIC PANEL
Anion gap: 7 (ref 5–15)
BUN: 11 mg/dL (ref 8–23)
CO2: 26 mmol/L (ref 22–32)
Calcium: 9.3 mg/dL (ref 8.9–10.3)
Chloride: 97 mmol/L — ABNORMAL LOW (ref 98–111)
Creatinine, Ser: 0.42 mg/dL — ABNORMAL LOW (ref 0.44–1.00)
GFR, Estimated: >60 mL/min (ref >60)
Glucose, Bld: 127 mg/dL — ABNORMAL HIGH (ref 70–99)
Potassium: 4.4 mmol/L (ref 3.5–5.1)
Sodium: 130 mmol/L — ABNORMAL LOW (ref 135–145)

## 2022-09-21 LAB — GLUCOSE, CAPILLARY
Glucose-Capillary: 155 mg/dL — ABNORMAL HIGH (ref 70–99)
Glucose-Capillary: 140 mg/dL — ABNORMAL HIGH (ref 70–99)
Glucose-Capillary: 124 mg/dL — ABNORMAL HIGH (ref 70–99)
Glucose-Capillary: 149 mg/dL — ABNORMAL HIGH (ref 70–99)

## 2022-09-21 NOTE — Progress Notes (Signed)
Speech Language Pathology Weekly Progress and Session Note  Patient Details  Name: [name] MRN: [id number] Date of Birth: June 05, 1956  Beginning of progress report period: September 17, 2022 End of progress report period: March {NUMBERS 1-31 (DATE):31396}, 2024  {chl ip rehab slp time calculations:[id number]}  Short Term Goals: [id number]    New Short Term Goals: [id number]  Weekly Progress Updates: Patient continues to demonstrate steady gains and has met *** of *** STGs this reporting period due to improved cognitive linguistic function and insight into deficits. Patient is currently completing cognitive tasks with overall min A verbal/visual cues in regards to sustained attention, functional recall, problem solving, and emergent awareness. Pt can continue to be self limiting at times, however, this appears to be improving during speech therapy sessions. Patient and family education is ongoing. Patient would benefit from continued skilled SLP intervention to maximize cognitive functioning and overall functional independence prior to discharge. Pt would benefit from f/u SLP intervention  at discharge in home health or outpatient setting.    Intensity:   Frequency:   Duration/Length of Stay:   Treatment/Interventions:     Daily Session Skilled Therapeutic Interventions: ***     General    Pain    Therapy/Group: {Therapy/Group:[id number]}  Elener Custodio T Burgundy Matuszak 09/21/2022, 7:43 PM

## 2022-09-21 NOTE — Progress Notes (Signed)
Speech Language Pathology Daily Session Note  Patient Details  Name: [name] MRN: [id number] Date of Birth: [birth year]  Today's Date: 09/21/2022 SLP Individual Time: 1000-1100 SLP Individual Time Calculation (min): 60 min  Short Term Goals: Week 3: SLP Short Term Goal 1 (Week 3): Pt will use external memory aids to recall important information with min A verbal cues. SLP Short Term Goal 2 (Week 3): Pt will demonstrate awareness of errors and repair with min-to-modA verbal cues. SLP Short Term Goal 3 (Week 3): Pt will participate in therapeutic task for 10 minute intervals with min A verbal cues. SLP Short Term Goal 4 (Week 3): Patient will complete functional and semi-complex problem solving tasks with min A verbal cues  Skilled Therapeutic Interventions: Skilled ST treatment focused on cognitive goals. Pt was greeted semi reclined in bed on arrival and accompanied by her son. SLP facilitated use of memory notebook with min A verbal cues for organization, verbal redirection for sustained attention (~10 minute intervals w/ min A verbal cues), and working memory. Pt used notebook to manage daily therapy schedule, events from previous sessions, and daily gratitude. Pt increasingly aware of "losing her train of thought"  mid sentence and requiring redirection with sup-to-min A verbal cues. Pt supports this occurring at baseline, but to much more of an extent at this time. Pt attributed baseline changes to attention and memory as age related changes and ongoing pain which can cause a distraction. Pt spoke about being very interested in receiving mental health services at discharge to help her cope with this difficult experience, find acceptance, and manage her emotions in a helpful way. Pt stated she also looks forward to getting back to writing a daily to-do list, like she used to do at home. SLP providing ongoing encouragement to use her notebook while here in the hospital to support this goal  of hers. Pt reported another goal is to personally stand in order to cross out days that pass on her wall calendar to help her feel one step closer to going home. Will communicate goal to PT/OT. Patient was left in bed with alarm activated and immediate needs within reach at end of session. Continue per current plan of care.      Pain  None/denied  Therapy/Group: Individual Therapy  [name] 09/21/2022, 12:28 PM

## 2022-09-21 NOTE — Progress Notes (Signed)
Physical Therapy Session Note  Patient Details  Name: [name] MRN: [id number] Date of Birth: August 10, 1955  Today's Date: 09/21/2022 PT Individual Time: [id number] PT Individual Time Calculation (min): 32 min   Short Term Goals: Week 2:  PT Short Term Goal 1 (Week 2): Pt will perform bed mobility with ModA +1. PT Short Term Goal 2 (Week 2): Pt will perform sit<>stand with MaxA +1. PT Short Term Goal 3 (Week 2): Pt will perform seat-to-seat transfers with MaxA +1 PT Short Term Goal 4 (Week 2): Pt with perform standing balance with alternating bil/single UE support for 1-2 minutes. PT Short Term Goal 5 (Week 2): Pt will initiate pre-gait training.  Skilled Therapeutic Interventions/Progress Updates: Pt presented in bed agreeable to therapy, son present throughout session. Session focused on supine therex as anticipate co-tx with OT later in am which was explained to patient and family. Pt minimally receptive to this therapist's interventions and noted irritability towards therapist throughout session. Pt participated in ankle pumps, heel slides with manually resistive leg press, hip abd/add x 15 each.  Pt required +2 assist to boost to [facility] for repositioning when therapist noted brief was poorly positioned. Pt was able to roll with heavy mod A to L/R for repositioning. Pt set up to perform ball isometric contractions with pt complaining of ball texture. Pt continuing to make statements such as "I don't like you talking to me like that". At this point this therapist offered for her to work with different therapist going forward with therapist and patient in agreement. This therapist left room and provided info to therapy supervisor.      Therapy Documentation Precautions:  Precautions Precautions: Fall, Back Precaution Comments: trach, rectal tube, foley Required Braces or Orthoses: Other Brace Other Brace: prevlon boots Restrictions Weight Bearing Restrictions: No Other  Position/Activity Restrictions: external rotation R hip at baseline at all times, RLE shorter than LLE, pt ambulated household distances PTA, w/c for community General:   Vital Signs: Therapy Vitals Temp: 97.6 F (36.4 C) Pulse Rate: 83 Resp: 18 BP: 110/87 Patient Position (if appropriate): Lying Oxygen Therapy SpO2: 100 % O2 Device: Room Air Pain: Pain Assessment Pain Scale: 0-10 Pain Score: 5   Therapy/Group: Individual Therapy  Dynver Clemson 09/21/2022, 4:09 PM

## 2022-09-21 NOTE — Progress Notes (Signed)
Occupational Therapy Session Note  Patient Details  Name: [name] MRN: [id number] Date of Birth: 03/02/1956  Session 1 Today's Date: 09/21/2022 OT Individual Time: [id number] OT Individual Time Calculation (min): 45 min  and Today's Date: 09/21/2022 OT Missed Time: 15 Minutes Missed Time Reason: Other (comment) (pt eating)  Session 2  Today's Date: 09/21/2022 OT Individual Time: [id number] OT Individual Time Calculation (min): 35 min  and Today's Date: 09/21/2022 OT Missed Time: 10 Minutes Missed Time Reason: Other (comment) (eating)   Short Term Goals: Week 3:  OT Short Term Goal 1 (Week 3): Pt will complete sit <> stand with max A of 1 consistently OT Short Term Goal 2 (Week 3): Pt will complete bed mobility to EOB with mod A OT Short Term Goal 3 (Week 3): Pt will complete stand pivot transfer with max A consistently  Skilled Therapeutic Interventions/Progress Updates:    Session 1 Pt received supine with un-rated pain in her back but premedicated, agreeable to OT session but requesting to finish eating breakfast so first 15 min missed. Son present during session. She came to EOB with mod A and mod cueing for technique. Improved rolling to the R. She required cueing for improved mechanics during reciprocal scooting to EOB but was able to do so with min A. She sat unsupported with (S) overall. Sit > stand and step pivot to the TIS w/c with max A. Remainder of session focused on self care seated in the TIS at the sink for improved engagement in daily routine to improve self-efficacy. She completed hair washing with max A with the hair washing tray. She then completed hair drying using a blow dryer for developing BUE endurance. She applied makeup and brushed teeth with set up assist. She was left sitting up with her son supervising, all needs within reach.    Session 2 Pt received sitting in the TIS w/c with 8/10 back pain- RN notified of request for pain medication during session.  10 min missed d/t pt requesting to finish eating salad. Pt completed sit > stand from the w/c using the eva walker with mod A. She required heavy max A, progressing to mod A to complete 7 ft to the bariatric BSC. She required facilitation at the glutes and trunk to maximize upright posture. She had much improved reciprocal stepping. She required total A for clothing management. (+) BM void She required total A for peri hygiene. Switched out the eva walker for a bilateral platform RW to determine if increased stability worked well for pt. She stood with heavy mod A. She was able to complete about 4 steps with max A before fatiguing and being unable to go any farther. TIS brought behind her. Pt then completed a stand pivot back to bed with max A. She was left supine with all needs met. RN notified of pt request to have wound changed at this time.     Therapy Documentation Precautions:  Precautions Precautions: Fall, Back Precaution Comments: trach, rectal tube, foley Required Braces or Orthoses: Other Brace Other Brace: prevlon boots Restrictions Weight Bearing Restrictions: No Other Position/Activity Restrictions: external rotation R hip at baseline at all times, RLE shorter than LLE, pt ambulated household distances PTA, w/c for community  Therapy/Group: Individual Therapy  Curtis Sites 09/21/2022, 6:07 AM

## 2022-09-21 NOTE — Progress Notes (Addendum)
PROGRESS NOTE   Subjective/Complaints:  Reports therapy is going well overall. Family would like to wean opoid medications during hospital admission- agree with this plan.  She would like Labs later than 5am if possible so she can sleep longer.     ROS:  +HA- intermittent,  + back pain,  + insomnia, +LE swelling-improving + decreased memory- gradually improving. Denies fevers, chills, cough, CP, SOB, abd pain, N/V/C, diarrhea, new/worsening paresthesias/weakness, vision changes or any other complaints at this time.    Objective:   No results found. Recent Labs    09/20/22 0344  WBC 7.3  HGB 10.1*  HCT 31.7*  PLT 419*      Recent Labs    09/19/22 0445 09/21/22 0531  NA 130* 130*  K 4.4 4.4  CL 98 97*  CO2 24 26  GLUCOSE 123* 127*  BUN 9 11  CREATININE <0.30* 0.42*  CALCIUM 9.1 9.3     Intake/Output Summary (Last 24 hours) at 09/21/2022 1445 Last data filed at 09/21/2022 1358 Gross per 24 hour  Intake 860 ml  Output 4100 ml  Net -3240 ml         Physical Exam: Vital Signs Blood pressure 110/87, pulse 83, temperature 97.6 F (36.4 C), resp. rate 18, height '5\' 1"'$  (1.549 m), weight 69.9 kg, SpO2 100 %.  Constitutional:  thin, NAD, sitting in WC-working with therapy HENT: [place]/AT,  MMM  Eyes: EOMI, conjunctiva clear, PERRL Neck:     Comments: Dry dressing over trach site.  Cardiovascular: RRR, soft murmur heard (VSD), trace BLE swelling-improved Pulmonary: CTAB no w/r/r, no increased WOB Abdominal: Soft, nontender, nondistended, positive bowel sounds/normal active Genitourinary:    Comments:Sacral wound dressing clean  foley in place-yellow urine Neurological: Follows commands, memory- decreased at times but stable compared to last week,  Alert and oriented x3, no hypertonia noted, CN 2-12 grossly intact Psychiatric:     appropriate, a little anxious    Assessment/Plan: 1. Functional deficits which  require 3+ hours per day of interdisciplinary therapy in a comprehensive inpatient rehab setting. Physiatrist is providing close team supervision and 24 hour management of active medical problems listed below. Physiatrist and rehab team continue to assess barriers to discharge/monitor patient progress toward functional and medical goals  Care Tool:  Bathing  Bathing activity did not occur: Refused           Bathing assist       Upper Body Dressing/Undressing Upper body dressing   What is the patient wearing?: Hospital gown only    Upper body assist Assist Level: Total Assistance - Patient < 25%    Lower Body Dressing/Undressing Lower body dressing      What is the patient wearing?: Hospital gown only     Lower body assist Assist for lower body dressing: Total Assistance - Patient < 25%     Toileting Toileting Toileting Activity did not occur Landscape architect and hygiene only): N/A (no void or bm) (using catheter and rectal tube)  Toileting assist Assist for toileting: Dependent - Patient 0%     Transfers Chair/bed transfer  Transfers assist  Chair/bed transfer activity did not occur: Safety/medical concerns  Chair/bed transfer  assist level: Maximal Assistance - Patient 25 - 49%     Locomotion Ambulation   Ambulation assist   Ambulation activity did not occur: Safety/medical concerns          Walk 10 feet activity   Assist  Walk 10 feet activity did not occur: Safety/medical concerns        Walk 50 feet activity   Assist Walk 50 feet with 2 turns activity did not occur: Safety/medical concerns         Walk 150 feet activity   Assist Walk 150 feet activity did not occur: Safety/medical concerns         Walk 10 feet on uneven surface  activity   Assist Walk 10 feet on uneven surfaces activity did not occur: Safety/medical concerns         Wheelchair     Assist Is the patient using a wheelchair?: Yes Type of  Wheelchair: Manual Wheelchair activity did not occur: Safety/medical concerns         Wheelchair 50 feet with 2 turns activity    Assist    Wheelchair 50 feet with 2 turns activity did not occur: Safety/medical concerns       Wheelchair 150 feet activity     Assist  Wheelchair 150 feet activity did not occur: Safety/medical concerns       Blood pressure 110/87, pulse 83, temperature 97.6 F (36.4 C), resp. rate 18, height '5\' 1"'$  (1.549 m), weight 69.9 kg, SpO2 100 %.  Medical Problem List and Plan: 1. Functional deficits secondary to osteomyelitis, infection of lumbar surgical site complicated by meningitis/ventriculitis.             -pt also with significant deconditioning             -patient may not yet shower             -ELOS/Goals: 10/06/22, Min/Mod A with PT, OT, SLP  -Continue CIR, PT OT SL -3/1 Repeat MRI brain around 09/29/22 prior to stopping abx per Dr Zada Finders note 09/01/22  -Adjust labs to 8am -Team conference tomorrow  2.  Antithrombotics: -DVT/anticoagulation:  Pharmaceutical: Lovenox '40mg'$  QD             -antiplatelet therapy: N/a  3. Pain Management: Butrans 60mg/hr patch for pain control w/ Flexeril '5mg'$  TID, Tylenol PRN, Oxycodone 7.'5mg'$  q4h PRN, Gabapentin '100mg'$  TID  -09/04/22 increased flexeril to 7.'5mg'$  TID -09/05/22 schedule Oxycodone '5mg'$  q6h and leave 2.'5mg'$  q4h PRN dosing -2/26 tylenol PRN for HA -2/28 Fioricet ordered PRN headache -09/17/22 will change PRN oxycodone 2.'5mg'$  to percocet 5-'325mg'$ , consider increase gabapentin to help with pain and HA- make changes gradually due to hx of encephalopathy -3/5 plan to wean oxycodone during admission- family reports she becomes easily addicted to these types of medication   4. Mood/Behavior/Sleep: LCSW to follow for evaluation and support.             --Trazodone '100mg'$  QHS and melatonin '5mg'$  QHS PRN for  insomnia/mood  -09/04/22 placed sign on door to minimize interruptions 11p-5a; changed CBG checks as  below to minimize interruptions             -antipsychotic agents:  Luvox '100mg'$  QHS and Lamictal '200mg'$  QD  -Continue Aricept '10mg'$  QD  -Seen by neuropsych-appreciate assistance  -3/1 increase melatonin to '10mg'$  dose QHS PRN  -3/4 decrease melatonin back to '5mg'$  dose QHS PRN  3/5 family reports she is getting fair amount of sleep, melatonin '10mg'$  caused her to  be too sedated and may have caused nausea, continue '5mg'$  dose  5. Neuropsych/cognition: This patient is not fully capable of making decisions on her own behalf. 6. Skin/Wound Care: WOC to follow for wound VAC changes --MASD managed by fecal system-- flexiseal clogged 09/04/22 AM but nursing states it's now working; monitor--might be able to d/c soon? -2/21 WOC stopped VAC, start aquacel and dressing -see flexiseal discussion below, #17 -2/27 WOC following, slough pulled away revealing 2cm deep defect -2/29 Spoke with Dr. Zada Finders today about wound depth, continue local wound care- continue to monitor, continue abx per ID,  no plan for procedure at this time -3/4 Seen by [facility] nursing- continue aquacel, abd pad/tape, apprecaite assistance  7. Fluids/Electrolytes/Nutrition: Monitor I/O. Continue tube feeds-->likely contributing to diarrhea             -monitor intake closely, continue vitamins -2/19 discontinue core track, Consult dietician -2/27 decrease BMP to every other day -09/19/22 BMP stable, still with hyponatremia 130 (see #15); monitor  8.Sepsis w/ osteomyelitis/meningitis/encephalitis: Continue Cefepime 2g q8h, Vanc '1250mg'$  q12h, and Flagyl '500mg'$  q8h for at least 6 weeks --3/1 per ID continue Vanc+Cefepime until 09/30/22 then switch to suppressive PO Levaquin '750mg'$  QD + Doxycycline '100mg'$  BID until f/up outpatient, Dr. [name] appt 10/11/22 at 2pm; metronidazole has been discontinued as of 09/16/22 (was primarily for PNA, no longer needed)  9. H/o PVT:  Monitor BP/heart rate TID--on coreg 12.'5mg'$  BID, Labetalol '10mg'$  q2h PRN  -09/04/22 BP/HR  fairly well controlled, cont regimen, monitor  -2/23  BP has been a little above goal, increase coreg dose to '25mg'$  BID  -3/5 BP/HR with fair control, continue to monitor     09/21/2022    2:12 PM 09/21/2022    6:48 AM 09/20/2022    7:43 PM  Vitals with BMI  Weight  154 lbs 2 oz   BMI  [id number]   Systolic [id number]  Diastolic 87 83 89  Pulse 83 82 84      10. Urinary retention: Has failed 4 voiding trails. Has had foley since 12/23             -maintain foley cath, foley mgt per protocol -- Continue Flomax 0.'4mg'$  qPM. Will d/c urecholine.   11. VDRF s/p trach: Tolerating PMSV well -I ordered downsizing to CFS # 4 today--tolerated well -continue ATC w/ pulmonary hygiene -begin day time plugging of trach tomorrow. Decannulate soon. -Trial of capping with respiratory-  can trial 24 hour capping today -2/22 Leave trach uncapped tonight -2/28 plan for decannulation today, has had trach capped for >72hours -2/29 Lurline Idol was  removed - doing well   12. T2DM: Monitor BS ac/hs and use SSI for elevated BS.              --continue tube feeds with supplements between meals due to poor intake             --continue liberalized diet for now. -09/04/22 CBGs looking great, Changed SSI and CBG checks to TID AC + QHS to minimize interruptions overnight, since tube feeds d/c'd -3/5 CBGS continue to be well controlled   CBG (last 3)  Recent Labs    09/20/22 2056 09/21/22 0646 09/21/22 1135  GLUCAP 105* 155* 140*     13. Hypokalemia: Recurrent likely due to diarrhea/intake.              --will add standing dose BID -->recheck labs on 02/17 and 02/19  -09/04/22 K+ 4.0, continue Klor 21mq BID,  monitor on weekly labs -09/05/22 doesn't like Klor packet but no great alternative (Phos-Nak would have to be heavily dosed to get equivalent K+, so only other alternative is IV-- will hold off for now) -2/19 Increase K+ supplement to 37mq BID, could consider change to tablet however this is a large tablet and  she had trouble swallowing it previously so continue packet for now -2/20, discussed with pharmacy, change to tablet potassium, IV K+ ordered , daily labs -2/23 K+ down to 3.4, increase K+ supplement 222m from BID to TID -2/26 stable at 4.2 -09/18/22 stable K+ 4.2 on 09/17/22, wants to change to BID dosing so switched Klor to 3071mBID (still will have 53m8motal/day)  -09/21/22 K+ stable 4.4, continue to monitor  14. Anemia of critical illness: Hgb 7.5-8.9 range -2/26 stable at HGB 10, monitor on weekly labs, next 09/20/22  15. Hyponatremia/hypochloremia: Improving 125/87-->130/94.  -09/04/22 Na 129, Cl 95, both stable, continue salt tabs 1g BID -Cortisol testing done during hospitalization (13.8 on 08/30/22), was normal, but daughter and pt want to make sure this is followed up--defer to weekday team regarding ongoing ?Addison's testing -2/21 129, a little decreased, fluid restriction started -2/28 stable at 131 -09/21/22 Na stable at 130, continue to follow with  q48h BMPs- adjust to 8am labs  16. Encephalopathy/Delirium: Continues to have intermittent confusion--but much improved compared to 3 days ago per family.  --Continue delirium precautions.  17. Loose stools likely d/t TF and abx.             -reducing TF as above.  -flexiseal clogged 09/04/22 AM but nursing states it's now working; monitor--might be able to d/c soon? -add probiotics and fiber-- fiber changed to [address] on 09/04/22 since this should cause less clogging of Cortrak -Lomotil 1-2 tabs QID PRN -2/21 improved, rectal tube to help keep wound clean -2/22 continue rectal tube for now -09/11/22 rectal tube dislodged, stools clogging it d/t more formed, d/c for now and see how she does with bedpan, if soiling near wound then needs replacement of flexiseal -09/12/22 stools type 5-6, not super frequent, using diaper not bedpan-- looks like no stool is getting close to wound, but advised trying bedpan. Advised nursing that if  stool becomes more frequent or more type 6-7, then flexiseal would need to be replaced. Monitor closely.  -2/29 LBM today, formed -3/5 LBM today  18. Hypothyroidism: continue synthroid 88mc4m  19. Chronic combined CHF, BLE Swelling: recent 2D echo done 08/20/2022 showed apical hypokinesis, LVEF 40-45%, RV function was reduced. Intermittently diuresed during hospitalization, most recently given '40mg'$  IV lasix on 09/03/22 -09/04/22 noted to still have BLE swelling, start '20mg'$  PO lasix and monitor lytes/BP/swelling -3/5 wt a little up, monitor tend Filed[facility]hts   09/19/22 0637 09/20/22 0605 09/21/22 0648  Weight: 67.6 kg 69.3 kg 69.9 kg    20. Oral ulcer  --2/28 Orajel for oral ulcer discomfort  LOS: 18 days A FACE TO FACE EVALUATION WAS PERFORMED  Jettie Lazare Jennye Boroughs2024, 2:45 PM

## 2022-09-21 NOTE — Plan of Care (Signed)
Pt alert and oriented x 4. Easily agitated with care. Pt agitated this am stating "Can't I just have 30 min more sleep. Pt had already been argumentative with IV team stating she didn't want labs until. 8am. IV team did not complete lab draw and has requested MD be notified on rounds to move labs draws to 8 am. Pt and daughter had requested dsg to completed on dayshift in am. Times changed per pt preference. Pt informed this am Antibiotics had to be given and it was time for pain medication. Vitals to be obtained closer to 0700 per pt request.  Problem: Consults Goal: RH GENERAL PATIENT EDUCATION Description: See Patient Education module for education specifics. Outcome: Progressing   Problem: RH BOWEL ELIMINATION Goal: RH STG MANAGE BOWEL WITH ASSISTANCE Description: STG Manage Bowel with toileting Assistance. Outcome: Progressing Goal: RH STG MANAGE BOWEL W/MEDICATION W/ASSISTANCE Description: STG Manage Bowel with Medication with Assistance. Outcome: Progressing   Problem: RH SKIN INTEGRITY Goal: RH STG SKIN FREE OF INFECTION/BREAKDOWN Description: Manage w min assist Outcome: Progressing Goal: RH STG ABLE TO PERFORM INCISION/WOUND CARE W/ASSISTANCE Description: STG Able To Perform Incision/Wound Care With min Assistance. Outcome: Progressing   Problem: RH SAFETY Goal: RH STG ADHERE TO SAFETY PRECAUTIONS W/ASSISTANCE/DEVICE Description: STG Adhere to Safety Precautions With cues Assistance/Device. Outcome: Progressing   Problem: RH PAIN MANAGEMENT Goal: RH STG PAIN MANAGED AT OR BELOW PT'S PAIN GOAL Description: < 4 with prns Outcome: Progressing   Problem: RH KNOWLEDGE DEFICIT GENERAL Goal: RH STG INCREASE KNOWLEDGE OF SELF CARE AFTER HOSPITALIZATION Description: Patient and family will be able to manage care at discharge using educational resources independently Outcome: Progressing   Problem: Activity: Goal: Ability to tolerate increased activity will improve Outcome:  Progressing   Problem: Respiratory: Goal: Ability to maintain a clear airway and adequate ventilation will improve Outcome: Progressing   Problem: Role Relationship: Goal: Method of communication will improve Outcome: Progressing   Problem: Education: Goal: Ability to verbalize activity precautions or restrictions will improve Outcome: Progressing Goal: Knowledge of the prescribed therapeutic regimen will improve Outcome: Progressing Goal: Understanding of discharge needs will improve Outcome: Progressing   Problem: Clinical Measurements: Goal: Ability to maintain clinical measurements within normal limits will improve Outcome: Progressing Goal: Postoperative complications will be avoided or minimized Outcome: Progressing Goal: Diagnostic test results will improve Outcome: Progressing

## 2022-09-22 ENCOUNTER — Inpatient Hospital Stay (HOSPITAL_COMMUNITY): Payer: Medicare Other

## 2022-09-22 LAB — GLUCOSE, CAPILLARY
Glucose-Capillary: 118 mg/dL — ABNORMAL HIGH (ref 70–99)
Glucose-Capillary: 128 mg/dL — ABNORMAL HIGH (ref 70–99)
Glucose-Capillary: 111 mg/dL — ABNORMAL HIGH (ref 70–99)
Glucose-Capillary: 125 mg/dL — ABNORMAL HIGH (ref 70–99)

## 2022-09-22 MED ORDER — DOXYCYCLINE HYCLATE 100 MG PO TABS
100.0000 mg | ORAL_TABLET | Freq: Two times a day (BID) | ORAL | Status: DC
  Administered 2022-10-01 – 2022-10-13 (×25): 100 mg via ORAL
  Filled 2022-09-22 (×25): qty 1

## 2022-09-22 MED ORDER — LEVOFLOXACIN 750 MG PO TABS
750.0000 mg | ORAL_TABLET | Freq: Every day | ORAL | Status: DC
  Administered 2022-10-01 – 2022-10-13 (×13): 750 mg via ORAL
  Filled 2022-09-22 (×13): qty 1

## 2022-09-22 NOTE — Progress Notes (Signed)
Physical Therapy Session Note  Patient Details  Name: [name] MRN: [id number] Date of Birth: [birth year]  Today's Date: 09/22/2022 PT Individual Time: [id number] PT Individual Time Calculation (min): 86 min   Short Term Goals: Week 2:  PT Short Term Goal 1 (Week 2): Pt will perform bed mobility with ModA +1. PT Short Term Goal 2 (Week 2): Pt will perform sit<>stand with MaxA +1. PT Short Term Goal 3 (Week 2): Pt will perform seat-to-seat transfers with MaxA +1 PT Short Term Goal 4 (Week 2): Pt with perform standing balance with alternating bil/single UE support for 1-2 minutes. PT Short Term Goal 5 (Week 2): Pt will initiate pre-gait training.  Skilled Therapeutic Interventions/Progress Updates:     Pt received semi reclined in bed and agrees to therapy. Reports pain in back. Unchanged from typical pain. PT provides repositioning and rest breaks to manage pain. Pt performs L logrolling with modA and cues for hand placement and body mechanics. Sidelying to sit with modA/maxA and cues for initiation and sequencing. From slightly elevated edge of bed, pt stands with modA +2 and bilateral platform RW. Pt ambulates x3' with modA +2 and pt able to clear both feet for swing phase without manual assistance. PT provides cues for posture, sequencing, and lateral weight shifting. Extended seated rest break. Platforms lowered slightly for improved body mechanics. Pt stands with modA +2 from [facility] and ambulates x5' with modA +2 and same cueing, with occasional assistance for progression of R lower extremity. WC transport to gym. Pt uses standing frame for endurance, strengthening, and activity tolerance training. Pt able to push buttocks up for placement of strap with bilateral upper extremities and cues for body mechanics. Pt able to stand in standing frame for ~3-4 minutes at a time with cues for hip extension and posture. Pt able to maintain standing without assistance from strap for several seconds at  a time. Pt takes extended seated rest break and stands one additional bout for 1 minute. WC transport back to room. Pt verbalizes need for bowel movement. Pt performs sit to stand with modA/maxA +1. Pt's daughter present to remove brief. Pt tranfers onto [facility] with stand pivot and maxA +1 (as well as daughter's assistance). Pt only has "gas". Sit to stand from [facility] with modA/maxA +1 and PT facilitating hip extension and upright posture. NT and daughter assist to pull up brief and then pt ambulates x5' with PT providing manual assistance at hips for extension and facilitates lateral weight shifting. Pt performs sit to supine with maxA +2. PT assists to position pt in propped L sidelying to offload wound on spine. Pt left with alarm intact and all needs within reach.   Therapy Documentation Precautions:  Precautions Precautions: Fall, Back Precaution Comments: trach, rectal tube, foley Required Braces or Orthoses: Other Brace Other Brace: prevlon boots Restrictions Weight Bearing Restrictions: No Other Position/Activity Restrictions: external rotation R hip at baseline at all times, RLE shorter than LLE, pt ambulated household distances PTA, w/c for community   Therapy/Group: Individual Therapy  Breck Coons, PT, DPT 09/22/2022, 5:14 PM

## 2022-09-22 NOTE — Patient Care Conference (Cosign Needed)
Inpatient RehabilitationTeam Conference and Plan of Care Update Date: 09/22/2022   Time: 12:11 PM    Patient Name: [name]      Medical Record Number: [id number]  Date of Birth: 1956/02/27 Sex: Female         Room/Bed: 4W14C/4W14C-01 Payor Info: Payor: [place] / Plan: [employer] MEDICARE / Product Type: *No Product type* /    Admit Date/Time:  09/03/2022  6:18 PM  Primary Diagnosis:  Infection of lumbar spine [facility])  Hospital Problems: Principal Problem:   Infection of lumbar spine ([place]) Active Problems:   VENTRICULAR SEPTAL DEFECT, CONGENITAL   Anxiety   Diabetes mellitus, type 2 (HCC)   Acid reflux   Mild cognitive impairment   Mood disorder (HCC)   Wound dehiscence   Encephalopathy   Malnutrition of moderate degree   Bacterial meningitis   Hardware complicating wound infection ([place])   Acute blood loss anemia   Chronic combined systolic and diastolic CHF (congestive heart failure) ([place])   Chronic midline thoracic back pain   Primary insomnia    Expected Discharge Date: Expected Discharge Date: 10/06/22  Team Members Present: Physician leading conference: Dr. Jennye Boroughs Social Worker Present: Ovidio Kin, LCSW Nurse Present: Dorien Chihuahua, RN PT Present: Becky Sax, PT;Rosita Dechalus, PTA OT Present: Laverle Hobby, OT SLP Present: Sherren Kerns, SLP PPS Coordinator present : Gunnar Fusi, SLP     Current Status/Progress Goal Weekly Team Focus  Bowel/Bladder   Foley, incont of bowel   Regain full cont of bowel   continue foley care and assist with toileting as needed    Swallow/Nutrition/ Hydration               ADL's   mod A UB ADLs, max A stand pivots, (S) sitting balance, max A to pivot   min-mod A overall   ADLs, transfers, standing balance, strengthening, d/c planning    Mobility   modA supine to sit, maxA 1-2 sit to supine, maxA 1-2 sit to stand, maxA 1-2 stand pivot transfer   min/modA overall  standing  tolerance, transfers, pre-gait/gait    Communication                Safety/Cognition/ Behavioral Observations  min A - pt is progressing with awareness of deficits and acceptance. Can still be self limiting but this is improving during speech sessions as she seems to hold herself more accountable. Still requires frequent verbal redirection for attention, can be difficult to discern if self limiting or primarily attention based. STM improving. Can problem solve with min A if interested in what she's doing   sup A   emergent awareness, sustained attention, memory with compensations, problem solving, pt/family education    Pain   c/o back pain 8/10, Taking PRN and scheduled pain medications   Pain < 3   Monitor prn    Skin   Dry Dressing over stoma. Daily sressing changes on back incision   promote healing and prevent infection  assess Q shift and prn      Discharge Planning:  Participating more in therapies trying to hold accountable for her behaviors-family here daily and observing in therapies   Team Discussion: Patient with hyponatremia post lumbar spine infection; dressing changes daily. Foley remains for wound healing; on Flomax.  Trazodone for insomnia. Limited by back pain,  and self limiting behavior. Awareness has improved; continue to work on emerging awareness and problem solving and initiation.   Patient on target to meet  rehab goals: yes, currently needs mod assist for upper body care and total for lower body and toileting. Needs max assist for stand pivots.  Able to ambulate up to 8' using a eva walker or bilateral platform walker. Needs min assist for cognition. Goals for discharge set for min- mod overall.    *See Care Plan and progress notes for long and short-term goals.   Revisions to Treatment Plan:  MRI of brain for f/u 09/29/22 before abx changed over   Teaching Needs: Safety, medications, skin/wound care, dietary modification, transfers, toileting, etc.  Foley care/peri care.   Current Barriers to Discharge: Decreased caregiver support, Wound care, and Behavior  Possible Resolutions to Barriers: Family education HH follow up services     Medical Summary Current Status: Osteomyeltis, pain, insomnia, wound, DM2 , hyponatremia,CHF  Barriers to Discharge: Medical stability;Cardiac Complications;Complicated Wound  Barriers to Discharge Comments: Osteomyeltis, pain, insomnia, wound, DM2 , hyponatremia,CHF Possible Resolutions to [employer] Focus: DM2, monitor labs, trach was removed, Repeat MRI next week, wound care   Continued Need for Acute Rehabilitation Level of Care: The patient requires daily medical management by a physician with specialized training in physical medicine and rehabilitation for the following reasons: Direction of a multidisciplinary physical rehabilitation program to maximize functional independence : Yes Medical management of patient stability for increased activity during participation in an intensive rehabilitation regime.: Yes Analysis of laboratory values and/or radiology reports with any subsequent need for medication adjustment and/or medical intervention. : Yes   I attest that I was present, lead the team conference, and concur with the assessment and plan of the team.   Dorien Chihuahua B 09/22/2022, 3:37 PM

## 2022-09-22 NOTE — Progress Notes (Signed)
Speech Language Pathology Daily Session Note  Patient Details  Name: [name] MRN: [id number] Date of Birth: 03/21/1956  Today's Date: 09/22/2022 SLP Individual Time: 0910-1000 SLP Individual Time Calculation (min): 50 min and Today's Date: 09/22/2022 SLP Missed Time:   Missed Time Reason: Other (Comment) (system wide tornado drill)  Short Term Goals: Week 3: SLP Short Term Goal 1 (Week 3): Pt will use external memory aids to recall important information with min A verbal cues. SLP Short Term Goal 2 (Week 3): Pt will demonstrate awareness of errors and repair with min-to-modA verbal cues. SLP Short Term Goal 3 (Week 3): Pt will participate in therapeutic task for 10 minute intervals with min A verbal cues. SLP Short Term Goal 4 (Week 3): Patient will complete functional and semi-complex problem solving tasks with min A verbal cues  Skilled Therapeutic Interventions: Skilled ST treatment focused on cognitive goals. Pt was greeted semi-reclined in bed on arrival and accompanied by daughter. Pt in good spirits today and pleased to see there were some breaks between sessions today to give her a moment "to process." SLP facilitated functional tasks involving problem solving, verbal reasoning, mental manipulation, and sustained attention with min A verbal cues. Pt continues to require at least min A verbal redirection cues for sustained attention. Pt more aware of this and attempts to recover, however talkative nature can interfere. Suspect some of this may be personality based and/or consistent with baseline. Pt recalled attention and memory strategies discussed during yesterday's session with min A verbal cues (i.e., turn off TV, reduce environments with side conversation and/or external distractions, write things down, repetition, association, etc.). Pt still requires encouragement to write in memory notebook each day and appears motivated by this. She benefits from min A verbal reminders on how  to format/organize day-to-day. Children continue to support/encourage use. SLP provided education on cognitive-linguistic changes s/p infectious encephalopathy per pt request. Pt wrote down "encephalopathy" in her notebook for recall. Patient was left in bed with alarm activated and immediate needs within reach at end of session. Continue per current plan of care.      Pain  None reported at rest  Therapy/Group: Individual Therapy  [name] 09/22/2022, 10:34 AM

## 2022-09-22 NOTE — Progress Notes (Signed)
Patient ID: [name], female   DOB: 01-Aug-1955, 67 y.o.   MRN: [id number]  Met with pt and daughter who is in her room to update them regarding team conference progress this week and still plan for discharge 3/20. Discussed need to begin to start looking to hire private duty CNA's and gave them the list. Both will follow up with. Pt and daughter had concerns regarding rib pain that has had since Friday when in PT.  Also the dressing change and how it has been done inconsistently each day. Daughter was hoping the wound would be healed by the time they go home. Have messaged Deborah-Care Coordinator to see if can follow up with Bedside RN's regarding dressing changes and messaged MD regarding her rib pain.  Pt feels she is putting in the work and doing what she needs to do. Her main goal is to ambulate from the bed to a bedside commode before she leaves here. Will continue to work on discharge needs and provide support

## 2022-09-22 NOTE — Progress Notes (Signed)
Occupational Therapy Session Note  Patient Details  Name: [name] MRN: [id number] Date of Birth: [birth year]  Today's Date: 09/22/2022 OT Individual Time: 1330-1430 OT Individual Time Calculation (min): 60 min    Short Term Goals: Week 3:  OT Short Term Goal 1 (Week 3): Pt will complete sit <> stand with max A of 1 consistently OT Short Term Goal 2 (Week 3): Pt will complete bed mobility to EOB with mod A OT Short Term Goal 3 (Week 3): Pt will complete stand pivot transfer with max A consistently  Skilled Therapeutic Interventions/Progress Updates:    Pt received supine with moderate pain at rest. Pt completed bed mobility to the EOB with mod A, improved lifting of the trunk and pushing through the R/L UE. She sat EOB and donned a shirt with (S). She required max A to don pants sit <> stand from EOB. Much improved stand from EOB- mod A overall with the B platform RW. Cueing required for glute activation and anterior pelvic tilt. She was able to take 4 steps pivoting to the TIS w/c with only mod A using the B platform RW. She requested to go down to the gift shop. Remainder of session focusing on community accessibility, development of self efficacy in choosing/paying for items of her choice and functional reaching across body while shopping. She required mod facilitation at the RUE to reach across body to insert credit card and to sign her name. She returned to the room and requested to use the [facility]. Mod A initially to stand, progressing to max to pivot to the [facility] d/t fatigue. She was unable to void any BM. Peri hygiene with total A. She completed 5 steps to the EOB with mod A progressing to max A with poor termination of transfer. She required max cueing and mod A to scoot up toward [place]. She returned to supine and was left with all needs met. Husband present.   Therapy Documentation Precautions:  Precautions Precautions: Fall, Back Precaution Comments: trach, rectal tube,  foley Required Braces or Orthoses: Other Brace Other Brace: prevlon boots Restrictions Weight Bearing Restrictions: No Other Position/Activity Restrictions: external rotation R hip at baseline at all times, RLE shorter than LLE, pt ambulated household distances PTA, w/c for community  Therapy/Group: Individual Therapy  Curtis Sites 09/22/2022, 6:43 AM

## 2022-09-22 NOTE — Progress Notes (Signed)
PROGRESS NOTE   Subjective/Complaints:  Reports she would like milk and carnation stopped, she doesn't drink it and it goes toward her count for fluids. NO additional concerns this AM.     ROS:  +HA- intermittent,  + back pain,  + insomnia, +LE swelling-improving + decreased memory- gradually improving. Denies fevers, malaise, chills, cough, CP, SOB, abd pain, N/V/C, diarrhea, new/worsening paresthesias/weakness, vision changes or any other complaints at this time.    Objective:   No results found. Recent Labs    09/20/22 0344  WBC 7.3  HGB 10.1*  HCT 31.7*  PLT 419*      Recent Labs    09/21/22 0531  NA 130*  K 4.4  CL 97*  CO2 26  GLUCOSE 127*  BUN 11  CREATININE 0.42*  CALCIUM 9.3     Intake/Output Summary (Last 24 hours) at 09/22/2022 1305 Last data filed at 09/22/2022 1059 Gross per 24 hour  Intake 240 ml  Output 4350 ml  Net -4110 ml         Physical Exam: Vital Signs Blood pressure (!) 151/84, pulse 88, temperature 98.2 F (36.8 C), temperature source Oral, resp. rate 18, height '5\' 1"'$  (1.549 m), weight 69.9 kg, SpO2 94 %.  Constitutional:  thin, NAD, sitting in WC-working with therapy in Room HENT: [place]/AT,  mucus membranes a little dry  Eyes: EOMI, conjunctiva clear, PERRL Neck:     Comments: Dry dressing over trach site.  Cardiovascular: RRR, soft murmur heard (VSD), trace BLE swelling-improved Pulmonary: CTAB no w/r/r, no increased WOB Abdominal: Soft, nontender, nondistended, positive bowel sounds/normal active Genitourinary:    Comments:Sacral wound dressing clean  foley in place-yellow urine Neurological: Follows commands, memory- decreased at times but stable compared to last week,  Alert and oriented x3, no hypertonia noted, CN 2-12 grossly intact Psychiatric:     appropriate, normal affect    Assessment/Plan: 1. Functional deficits which require 3+ hours per day of  interdisciplinary therapy in a comprehensive inpatient rehab setting. Physiatrist is providing close team supervision and 24 hour management of active medical problems listed below. Physiatrist and rehab team continue to assess barriers to discharge/monitor patient progress toward functional and medical goals  Care Tool:  Bathing  Bathing activity did not occur: Refused           Bathing assist       Upper Body Dressing/Undressing Upper body dressing   What is the patient wearing?: Hospital gown only    Upper body assist Assist Level: Total Assistance - Patient < 25%    Lower Body Dressing/Undressing Lower body dressing      What is the patient wearing?: Hospital gown only     Lower body assist Assist for lower body dressing: Total Assistance - Patient < 25%     Toileting Toileting Toileting Activity did not occur Landscape architect and hygiene only): N/A (no void or bm) (using catheter and rectal tube)  Toileting assist Assist for toileting: Dependent - Patient 0%     Transfers Chair/bed transfer  Transfers assist  Chair/bed transfer activity did not occur: Safety/medical concerns  Chair/bed transfer assist level: Maximal Assistance - Patient 25 - 49%  Locomotion Ambulation   Ambulation assist   Ambulation activity did not occur: Safety/medical concerns          Walk 10 feet activity   Assist  Walk 10 feet activity did not occur: Safety/medical concerns        Walk 50 feet activity   Assist Walk 50 feet with 2 turns activity did not occur: Safety/medical concerns         Walk 150 feet activity   Assist Walk 150 feet activity did not occur: Safety/medical concerns         Walk 10 feet on uneven surface  activity   Assist Walk 10 feet on uneven surfaces activity did not occur: Safety/medical concerns         Wheelchair     Assist Is the patient using a wheelchair?: Yes Type of Wheelchair: Manual Wheelchair  activity did not occur: Safety/medical concerns         Wheelchair 50 feet with 2 turns activity    Assist    Wheelchair 50 feet with 2 turns activity did not occur: Safety/medical concerns       Wheelchair 150 feet activity     Assist  Wheelchair 150 feet activity did not occur: Safety/medical concerns       Blood pressure (!) 151/84, pulse 88, temperature 98.2 F (36.8 C), temperature source Oral, resp. rate 18, height '5\' 1"'$  ([id number] m), weight 69.9 kg, SpO2 94 %.  Medical Problem List and Plan: 1. Functional deficits secondary to osteomyelitis, infection of lumbar surgical site complicated by meningitis/ventriculitis.             -pt also with significant deconditioning             -patient may not yet shower             -ELOS/Goals: 10/06/22, Min/Mod A with PT, OT, SLP  -Continue CIR, PT OT SL -3/1 Repeat MRI brain around 09/29/22 prior to stopping abx per Dr Zada Finders note 09/01/22  -Adjust labs to 8am -Team conference today please see physician documentation under team conference tab, met with team  to discuss problems,progress, and goals. Formulized individual treatment plan based on medical history, underlying problem and comorbidities.    2.  Antithrombotics: -DVT/anticoagulation:  Pharmaceutical: Lovenox '40mg'$  QD             -antiplatelet therapy: N/a  3. Pain Management: Butrans 44mg/hr patch for pain control w/ Flexeril '5mg'$  TID, Tylenol PRN, Oxycodone 7.'5mg'$  q4h PRN, Gabapentin '100mg'$  TID  -09/04/22 increased flexeril to 7.'5mg'$  TID -09/05/22 schedule Oxycodone '5mg'$  q6h and leave 2.'5mg'$  q4h PRN dosing -2/26 tylenol PRN for HA -2/28 Fioricet ordered PRN headache -09/17/22 will change PRN oxycodone 2.'5mg'$  to percocet 5-'325mg'$ , consider increase gabapentin to help with pain and HA- make changes gradually due to hx of encephalopathy -3/5 plan to wean oxycodone during admission- family reports she becomes easily addicted to these types of medication -3/6 continue current  pain medications for now   4. Mood/Behavior/Sleep: LCSW to follow for evaluation and support.             --Trazodone '100mg'$  QHS and melatonin '5mg'$  QHS PRN for  insomnia/mood  -09/04/22 placed sign on door to minimize interruptions 11p-5a; changed CBG checks as below to minimize interruptions             -antipsychotic agents:  Luvox '100mg'$  QHS and Lamictal '200mg'$  QD  -Continue Aricept '10mg'$  QD  -Seen by neuropsych-appreciate assistance  -3/1 increase melatonin to '10mg'$   dose QHS PRN  -3/4 decrease melatonin back to '5mg'$  dose QHS PRN  3/5 family reports she is getting fair amount of sleep, melatonin '10mg'$  caused her to be too sedated and may have caused nausea, continue '5mg'$  dose  5. Neuropsych/cognition: This patient is not fully capable of making decisions on her own behalf. 6. Skin/Wound Care: WOC to follow for wound VAC changes --MASD managed by fecal system-- flexiseal clogged 09/04/22 AM but nursing states it's now working; monitor--might be able to d/c soon? -2/21 WOC stopped VAC, start aquacel and dressing -see flexiseal discussion below, #17 -2/27 WOC following, slough pulled away revealing 2cm deep defect -2/29 Spoke with Dr. Zada Finders today about wound depth, continue local wound care- continue to monitor, continue abx per ID,  no plan for procedure at this time -3/4 Seen by [place] nursing- continue aquacel, abd pad/tape, apprecaite assistance  7. Fluids/Electrolytes/Nutrition: Monitor I/O. Continue tube feeds-->likely contributing to diarrhea             -monitor intake closely, continue vitamins -2/19 discontinue core track, Consult dietician -2/27 decrease BMP to every other day -09/19/22 BMP stable, still with hyponatremia 130 (see #15); monitor  8.Sepsis w/ osteomyelitis/meningitis/encephalitis: Continue Cefepime 2g q8h, Vanc '1250mg'$  q12h, and Flagyl '500mg'$  q8h for at least 6 weeks --3/1 per ID continue Vanc+Cefepime until 09/30/22 then switch to suppressive PO Levaquin '750mg'$  QD + Doxycycline  '100mg'$  BID until f/up outpatient, Dr. [name] appt 10/11/22 at 2pm; metronidazole has been discontinued as of 09/16/22 (was primarily for PNA, no longer needed)  9. H/o PVT:  Monitor BP/heart rate TID--on coreg 12.'5mg'$  BID, Labetalol '10mg'$  q2h PRN  -09/04/22 BP/HR fairly well controlled, cont regimen, monitor  -2/23  BP has been a little above goal, increase coreg dose to '25mg'$  BID  -3/5 BP/HR with fair control, continue to monitor     09/22/2022    5:50 AM 09/21/2022    7:44 PM 09/21/2022    2:12 PM  Vitals with BMI  Systolic [id number]  Diastolic 84 90 87  Pulse 88 86 83      10. Urinary retention: Has failed 4 voiding trails. Has had foley since 12/23             -maintain foley cath, foley mgt per protocol -- Continue Flomax 0.'4mg'$  qPM. Will d/c urecholine.   11. VDRF s/p trach: Tolerating PMSV well -I ordered downsizing to CFS # 4 today--tolerated well -continue ATC w/ pulmonary hygiene -begin day time plugging of trach tomorrow. Decannulate soon. -Trial of capping with respiratory-  can trial 24 hour capping today -2/22 Leave trach uncapped tonight -2/28 plan for decannulation today, has had trach capped for >72hours -2/29 Lurline Idol was  removed - doing well   12. T2DM: Monitor BS ac/hs and use SSI for elevated BS.              --continue tube feeds with supplements between meals due to poor intake             --continue liberalized diet for now. -09/04/22 CBGs looking great, Changed SSI and CBG checks to TID AC + QHS to minimize interruptions overnight, since tube feeds d/c'd -3/6 CBGS well controlled   CBG (last 3)  Recent Labs    09/21/22 2022 09/22/22 0823 09/22/22 1131  GLUCAP 149* 118* 128*     13. Hypokalemia: Recurrent likely due to diarrhea/intake.              --will add standing dose BID -->recheck  labs on 02/17 and 02/19  -09/04/22 K+ 4.0, continue Klor 1mq BID, monitor on weekly labs -09/05/22 doesn't like Klor packet but no great alternative (Phos-Nak would have  to be heavily dosed to get equivalent K+, so only other alternative is IV-- will hold off for now) -2/19 Increase K+ supplement to 481m BID, could consider change to tablet however this is a large tablet and she had trouble swallowing it previously so continue packet for now -2/20, discussed with pharmacy, change to tablet potassium, IV K+ ordered , daily labs -2/23 K+ down to 3.4, increase K+ supplement 2056mfrom BID to TID -2/26 stable at 4.2 -09/18/22 stable K+ 4.2 on 09/17/22, wants to change to BID dosing so switched Klor to 39m70mID (still will have 60mE88mtal/day)  -09/21/22 K+ stable 4.4, continue to monitor  14. Anemia of critical illness: Hgb 7.5-8.9 range -2/26 stable at HGB 10, monitor on weekly labs, next 09/20/22  15. Hyponatremia/hypochloremia: Improving 125/87-->130/94.  -09/04/22 Na 129, Cl 95, both stable, continue salt tabs 1g BID -Cortisol testing done during hospitalization (13.8 on 08/30/22), was normal, but daughter and pt want to make sure this is followed up--defer to weekday team regarding ongoing ?Addison's testing -2/21 129, a little decreased, fluid restriction started -2/28 stable at 131 -09/21/22 Na stable at 130, continue to follow with  q48h BMPs- adjust to 8am labs  16. Encephalopathy/Delirium: Continues to have intermittent confusion--but much improved compared to 3 days ago per family.  --Continue delirium precautions.  17. Loose stools likely d/t TF and abx.             -reducing TF as above.  -flexiseal clogged 09/04/22 AM but nursing states it's now working; monitor--might be able to d/c soon? -add probiotics and fiber-- fiber changed to NutriBenewahRD on 09/04/22 since this should cause less clogging of Cortrak -Lomotil 1-2 tabs QID PRN -2/21 improved, rectal tube to help keep wound clean -2/22 continue rectal tube for now -09/11/22 rectal tube dislodged, stools clogging it d/t more formed, d/c for now and see how she does with bedpan, if soiling near  wound then needs replacement of flexiseal -09/12/22 stools type 5-6, not super frequent, using diaper not bedpan-- looks like no stool is getting close to wound, but advised trying bedpan. Advised nursing that if stool becomes more frequent or more type 6-7, then flexiseal would need to be replaced. Monitor closely.  -2/29 LBM today, formed -3/6 LBM yesterday, continue to monitor  18. Hypothyroidism: continue synthroid 88mcg68m 19. Chronic combined CHF, BLE Swelling: recent 2D echo done 08/20/2022 showed apical hypokinesis, LVEF 40-45%, RV function was reduced. Intermittently diuresed during hospitalization, most recently given '40mg'$  IV lasix on 09/03/22 -09/04/22 noted to still have BLE swelling, start '20mg'$  PO lasix and monitor lytes/BP/swelling -3/5 wt a little up, monitor tend -3/6 asked nutrition to stop milk/carnation- she wasn't drinking but it was counting toward her fluid restriction Filed Weights   09/19/22 0637 09/20/22 0605 09/21/22 0648  Weight: 67.6 kg 69.3 kg 69.9 kg    20. Oral ulcer  --2/28 Orajel for oral ulcer discomfort  LOS: 19 days A FACE TO FACE EVALUATION WAS PERFORMED  Brooklyne Radke SJennye Boroughs024, 1:05 PM

## 2022-09-23 DIAGNOSIS — M25512 Pain in left shoulder: Secondary | ICD-10-CM

## 2022-09-23 DIAGNOSIS — R195 Other fecal abnormalities: Secondary | ICD-10-CM

## 2022-09-23 LAB — GLUCOSE, CAPILLARY
Glucose-Capillary: 153 mg/dL — ABNORMAL HIGH (ref 70–99)
Glucose-Capillary: 173 mg/dL — ABNORMAL HIGH (ref 70–99)
Glucose-Capillary: 123 mg/dL — ABNORMAL HIGH (ref 70–99)
Glucose-Capillary: 106 mg/dL — ABNORMAL HIGH (ref 70–99)

## 2022-09-23 MED ORDER — DICLOFENAC SODIUM 1 % EX GEL
2.0000 g | Freq: Four times a day (QID) | CUTANEOUS | Status: DC
  Administered 2022-09-23 – 2022-10-12 (×44): 2 g via TOPICAL
  Filled 2022-09-23: qty 100

## 2022-09-23 MED ORDER — MEDIHONEY WOUND/BURN DRESSING EX PSTE
1.0000 | PASTE | Freq: Every day | CUTANEOUS | Status: DC
  Administered 2022-09-23 – 2022-09-30 (×8): 1 via TOPICAL
  Filled 2022-09-23: qty 44

## 2022-09-23 NOTE — Progress Notes (Signed)
Occupational Therapy Weekly Progress Note  Patient Details  Name: [name] MRN: [id number] Date of Birth: 31-Jul-1955  Beginning of progress report period: September 17, 2022 End of progress report period: September 23, 2022  Today's Date: 09/23/2022 OT Individual Time: [id number] OT Individual Time Calculation (min): 59 min    Patient has met 3 of 3 short term goals.  This has been Betty's best week in rehab so far with substantial improvement in standing and ADL transfers. She is able to complete UB ADLs with (S) and LB/toileting tasks with total A (not been a big focus so far with emphasis on standing and transfers to reduce family burden). [name] has been overall more motivated to participate in therapy and more compliant with staff. Family has been advised of need to hire private caregiver to provide min-mod A level anticipated care.   Patient continues to demonstrate the following deficits: muscle weakness, decreased cardiorespiratoy endurance, decreased coordination, decreased initiation, decreased memory, and delayed processing, and decreased sitting balance, decreased standing balance, decreased postural control, and decreased balance strategies and therefore will continue to benefit from skilled OT intervention to enhance overall performance with BADL and Reduce care partner burden.  Patient progressing toward long term goals..  Continue plan of care.  OT Short Term Goals Week 3:  OT Short Term Goal 1 (Week 3): Pt will complete sit <> stand with max A of 1 consistently OT Short Term Goal 1 - Progress (Week 3): Met OT Short Term Goal 2 (Week 3): Pt will complete bed mobility to EOB with mod A OT Short Term Goal 2 - Progress (Week 3): Met OT Short Term Goal 3 (Week 3): Pt will complete stand pivot transfer with max A consistently OT Short Term Goal 3 - Progress (Week 3): Met Week 4:  OT Short Term Goal 1 (Week 4): Pt will complete stand pivot consistently with mod A OT Short Term Goal  2 (Week 4): Pt will complete sit > stand with mod A consistently OT Short Term Goal 3 (Week 4): Pt will stand statically with BUE support and no more than min A  Skilled Therapeutic Interventions/Progress Updates:    Pt received supine with 7/10 pain in her back agreeable to OT session. She came to EOB with mod A. She sat EOB with (S) for 30+ minutes to improve sitting tolerance and build up core/back stabilization musculature. She engaged in dynamic chest opening activity with the sliding ball, bringing BUE laterally while generating power to bring ball up the strings. She completed 3x20 repetitions to increase endurance in the UE. She then worked on tricep extension on bilateral yoga blocks lateral to hips. Carryover to supporting herself on the RW in standing. She reported need to attempt. She completed a stand pivot from EOB with max A to stand and pivot with the B platform RW. Toileting tasks with total A. Very small BM void. She completed a stand pivot to the w/c with max A. She completed hand hygiene at the sink with set up assist. She was left sitting up with all needs met.   Therapy Documentation Precautions:  Precautions Precautions: Fall, Back Precaution Comments: trach, rectal tube, foley Required Braces or Orthoses: Other Brace Other Brace: prevlon boots Restrictions Weight Bearing Restrictions: No Other Position/Activity Restrictions: external rotation R hip at baseline at all times, RLE shorter than LLE, pt ambulated household distances PTA, w/c for community   Therapy/Group: Individual Therapy  Curtis Sites 09/23/2022, 6:36 AM

## 2022-09-23 NOTE — Progress Notes (Signed)
Occupational Therapy Session Note  Patient Details  Name: [name] MRN: [id number] Date of Birth: August 22, 1955  Today's Date: 09/23/2022 OT Individual Time: 1310-1345 OT Individual Time Calculation (min): 35 min  and Today's Date: 09/23/2022 OT Missed Time: 10 Minutes Missed Time Reason: Other (comment) (pt off unit)   Short Term Goals: Week 4:  OT Short Term Goal 1 (Week 4): Pt will complete stand pivot consistently with mod A OT Short Term Goal 2 (Week 4): Pt will complete sit > stand with mod A consistently OT Short Term Goal 3 (Week 4): Pt will stand statically with BUE support and no more than min A  Skilled Therapeutic Interventions/Progress Updates:    First 10 min missed d/t pt off unit with her son. She reported need to have BM. Stand pivot transfer with the platform RW with heavy mod A. Pt more fatigued this afternoon and requiring more assist to stand and pivot. Total A for clothing management. She sat for severla minutes to void BM. Total A for peri hygiene. She completed a stand pivot transfer back to the TIS with mod, progressing to max A. Pt was taken via w/c to the therapy gym for time management. In the parallel bars she worked on sit > stand (max A), glute activation, and tricep extension. All for intended carryover to ADL transfers. Max facilitation for bringing pelvis anterior. D/t baseline leg length discrepancy glute activation forward more difficult biomechanically. She returned to her room and to bed with max A stand pivot transfer. Pt left supine with all needs met. Son present.   Therapy Documentation Precautions:  Precautions Precautions: Fall, Back Precaution Comments: trach, rectal tube, foley Required Braces or Orthoses: Other Brace Other Brace: prevlon boots Restrictions Weight Bearing Restrictions: No Other Position/Activity Restrictions: external rotation R hip at baseline at all times, RLE shorter than LLE, pt ambulated household distances PTA, w/c  for community    Therapy/Group: Individual Therapy  Curtis Sites 09/23/2022, 9:56 AM

## 2022-09-23 NOTE — Progress Notes (Signed)
Physical Therapy Session Note  Patient Details  Name: [name] MRN: [id number] Date of Birth: 21-Nov-1955  Today's Date: 09/23/2022 PT Individual Time: [id number] PT Individual Time Calculation (min): 31 min   Short Term Goals: Week 2:  PT Short Term Goal 1 (Week 2): Pt will perform bed mobility with ModA +1. PT Short Term Goal 2 (Week 2): Pt will perform sit<>stand with MaxA +1. PT Short Term Goal 3 (Week 2): Pt will perform seat-to-seat transfers with MaxA +1 PT Short Term Goal 4 (Week 2): Pt with perform standing balance with alternating bil/single UE support for 1-2 minutes. PT Short Term Goal 5 (Week 2): Pt will initiate pre-gait training.  Skilled Therapeutic Interventions/Progress Updates:    Pt received supine in bed resting, watching TV with her daughter present. Pt upset she was not made aware of this additional therapy session being added to her schedule, but despite this is agreeable to participating in bed level therapy due to fatigue and pain. Therapist made a note for scheduler to provide pt updates if her schedule is changed.  Performed the following exercises x20 reps in each LE:  - active assist heel slides with pt having increased muscle fatigue in L LE compared to R - pt reports hip pain bilaterally during this exercise with hx of bilateral THA with revisions - ankle DF against level 2 red theraband resistance, noticed pt's L ankle fatigued much more quickly than R - ankle PF against level 2 red theraband resistance - short arc quads   Therapist providing cuing for improved form/technique; however, pt moves quickly during the exercises.   Therapist discussed pt's LTGs and she reports her primary goal is to be able to walk from the bed to the toilet. Pt excited about progress she has made with gait training and being able to advance her LEs with less assistance. Inquired with primary PT about option of using litegait harness, but will need to discuss with medical  team to ensure this is appropriate/safe given pt's spinal wounds. Pt requesting to have a rest day over the weekend so notified schedulers to see if this was possible. Pt left supine in bed with needs in reach, lines intact, and her daughter present.   Therapy Documentation Precautions:  Precautions Precautions: Fall, Back Precaution Comments: trach, rectal tube, foley Required Braces or Orthoses: Other Brace Other Brace: prevlon boots Restrictions Weight Bearing Restrictions: No Other Position/Activity Restrictions: external rotation R hip at baseline at all times, RLE shorter than LLE, pt ambulated household distances PTA, w/c for community   Pain:  Reports bilateral hip pain during hip flexion exercise - modified interventions to avoid further increased pain.    Therapy/Group: Individual Therapy  Tawana Scale , PT, DPT, NCS, CSRS 09/23/2022, 3:39 PM

## 2022-09-23 NOTE — Progress Notes (Signed)
Nutrition Follow-up  DOCUMENTATION CODES:   Not applicable  INTERVENTION:  - Continue Ensure Enlive po TID, each supplement provides 350 kcal and 20 grams of protein.  NUTRITION DIAGNOSIS:   Increased nutrient needs related to wound healing as evidenced by estimated needs.  GOAL:   Patient will meet greater than or equal to 90% of their needs - Ongoing.   MONITOR:   PO intake, Supplement acceptance, Skin, Labs  REASON FOR ASSESSMENT:   Consult Assessment of nutrition requirement/status  ASSESSMENT:   67 y.o. female admitted to CIR after revision of lumbar surgery after non healing and exposed hardware. Required trach and Cortrak placement. PMH includes HTN, GERD, HLD and T2DM.  Meds reviewed: pepcid, nutrisource fiber, lasix, sliding scale insulin, MVI, klor-con, florastor. Labs reviewed: Na low, chloride low.   Pt was being seen by another provider at time of assessment. Per record, PO intakes have fluctuated over the past 7 days. Weights remain stable. Pt has eaten an average of 37% of her meals over the past 7 days. Pt refused morning Ensure. RD will continue to monitor PO intakes.   Diet Order:   Diet Order             Diet regular Fluid consistency: Thin; Fluid restriction: 1500 mL Fluid  Diet effective now                   EDUCATION NEEDS:   No education needs have been identified at this time  Skin:  Skin Assessment: Skin Integrity Issues: Skin Integrity Issues:: Wound VAC, Incisions Wound Vac: Back Incisions: Back  Last BM:  3/7  Height:   Ht Readings from Last 1 Encounters:  09/03/22 5\' 1"  (1.549 m)    Weight:   Wt Readings from Last 1 Encounters:  09/23/22 67 kg    Ideal Body Weight:  45.5 kg  BMI:  Body mass index is 27.91 kg/m.  Estimated Nutritional Needs:   Kcal:  1800-2000  Protein:  90-110 grams  Fluid:  >/= 1.8 L  Thalia Bloodgood, RD, LDN, CNSC.

## 2022-09-23 NOTE — Progress Notes (Signed)
PROGRESS NOTE   Subjective/Complaints:  Pt reports some L shoulder discomfort with activity. Shoulder also painful at night. She had prior shoulder replacement. No additional concerns this am.     ROS:  +HA- intermittent,  + back pain+ L shoulder pain,  + insomnia, +LE swelling-improving + decreased memory- gradually improving. Denies fevers, malaise, chills, cough, CP, SOB, abd pain, N/V/C, diarrhea, new/worsening paresthesias/weakness, vision changes or any other complaints at this time.    Objective:   DG Chest 2 View  Result Date: 09/22/2022 CLINICAL DATA:  Previous abnormal chest x-ray, history of left pleural effusion EXAM: CHEST - 2 VIEW COMPARISON:  09/03/2022 FINDINGS: Single frontal view of the chest demonstrates stable right-sided PICC. The tracheostomy tube and enteric catheter seen previously are no longer identified. Cardiac silhouette is stable. Increased density in the retrocardiac region consistent with chronic eventration of the left hemidiaphragm. Consolidation at the left lung base most consistent with compressive atelectasis as seen on prior CT. No definite airspace disease on this exam. No large effusion or pneumothorax. No acute bony abnormality. Stable left shoulder arthroplasty. IMPRESSION: 1. Chronic elevation of the left hemidiaphragm with compressive atelectasis at the left lung base. 2. No acute intrathoracic process. Electronically Signed   By: Randa Ngo M.D.   On: 09/22/2022 16:27   No results for input(s): "WBC", "HGB", "HCT", "PLT" in the last 72 hours.    Recent Labs    09/21/22 0531  NA 130*  K 4.4  CL 97*  CO2 26  GLUCOSE 127*  BUN 11  CREATININE 0.42*  CALCIUM 9.3     Intake/Output Summary (Last 24 hours) at 09/23/2022 1041 Last data filed at 09/23/2022 0905 Gross per 24 hour  Intake 360 ml  Output 3100 ml  Net -2740 ml         Physical Exam: Vital Signs Blood pressure (!)  144/91, pulse 82, temperature 97.9 F (36.6 C), temperature source Oral, resp. rate 18, height '5\' 1"'$  (1.549 m), weight 67 kg, SpO2 98 %.  Constitutional:  thin, NAD, sitting in WC-working with therapy in Room HENT: Brenda/AT,  mucus membranes a little dry  Eyes: EOMI, conjunctiva clear, PERRL Neck:     Comments: Dry dressing over trach site.  Cardiovascular: RRR, soft murmur heard (VSD), trace BLE swelling-improved Pulmonary: CTAB no w/r/r, no increased WOB Abdominal: Soft, nontender, nondistended, positive bowel sounds/normal active Genitourinary:    Comments:Sacral wound dressing clean  foley in place-yellow urine Neurological: Follows commands, memory- decreased at times but stable compared to last week,  Alert and oriented x3, no hypertonia noted, CN 2-12 grossly intact Psychiatric:     appropriate, normal affect  MSK: tenderness to palpation anterior L shoulder, mild pain with passive end ROM in abduction, internal and external rotation     Assessment/Plan: 1. Functional deficits which require 3+ hours per day of interdisciplinary therapy in a comprehensive inpatient rehab setting. Physiatrist is providing close team supervision and 24 hour management of active medical problems listed below. Physiatrist and rehab team continue to assess barriers to discharge/monitor patient progress toward functional and medical goals  Care Tool:  Bathing  Bathing activity did not occur: Refused  Bathing assist       Upper Body Dressing/Undressing Upper body dressing   What is the patient wearing?: Hospital gown only    Upper body assist Assist Level: Total Assistance - Patient < 25%    Lower Body Dressing/Undressing Lower body dressing      What is the patient wearing?: Hospital gown only     Lower body assist Assist for lower body dressing: Total Assistance - Patient < 25%     Toileting Toileting Toileting Activity did not occur Landscape architect and hygiene only):  N/A (no void or bm) (using catheter and rectal tube)  Toileting assist Assist for toileting: Dependent - Patient 0%     Transfers Chair/bed transfer  Transfers assist  Chair/bed transfer activity did not occur: Safety/medical concerns  Chair/bed transfer assist level: Maximal Assistance - Patient 25 - 49%     Locomotion Ambulation   Ambulation assist   Ambulation activity did not occur: Safety/medical concerns          Walk 10 feet activity   Assist  Walk 10 feet activity did not occur: Safety/medical concerns        Walk 50 feet activity   Assist Walk 50 feet with 2 turns activity did not occur: Safety/medical concerns         Walk 150 feet activity   Assist Walk 150 feet activity did not occur: Safety/medical concerns         Walk 10 feet on uneven surface  activity   Assist Walk 10 feet on uneven surfaces activity did not occur: Safety/medical concerns         Wheelchair     Assist Is the patient using a wheelchair?: Yes Type of Wheelchair: Manual Wheelchair activity did not occur: Safety/medical concerns         Wheelchair 50 feet with 2 turns activity    Assist    Wheelchair 50 feet with 2 turns activity did not occur: Safety/medical concerns       Wheelchair 150 feet activity     Assist  Wheelchair 150 feet activity did not occur: Safety/medical concerns       Blood pressure (!) 144/91, pulse 82, temperature 97.9 F (36.6 C), temperature source Oral, resp. rate 18, height '5\' 1"'$  (1.549 m), weight 67 kg, SpO2 98 %.  Medical Problem List and Plan: 1. Functional deficits secondary to osteomyelitis, infection of lumbar surgical site complicated by meningitis/ventriculitis.             -pt also with significant deconditioning             -patient may not yet shower             -ELOS/Goals: 10/06/22, Min/Mod A with PT, OT, SLP  -Continue CIR, PT OT SL -3/1 Repeat MRI brain around 09/29/22 prior to stopping abx per  Dr Zada Finders note 09/01/22  -Adjust labs to 8am   2.  Antithrombotics: -DVT/anticoagulation:  Pharmaceutical: Lovenox '40mg'$  QD             -antiplatelet therapy: N/a  3. Pain Management: Butrans 52mg/hr patch for pain control w/ Flexeril '5mg'$  TID, Tylenol PRN, Oxycodone 7.'5mg'$  q4h PRN, Gabapentin '100mg'$  TID  -09/04/22 increased flexeril to 7.'5mg'$  TID -09/05/22 schedule Oxycodone '5mg'$  q6h and leave 2.'5mg'$  q4h PRN dosing -2/26 tylenol PRN for HA -2/28 Fioricet ordered PRN headache -09/17/22 will change PRN oxycodone 2.'5mg'$  to percocet 5-'325mg'$ , consider increase gabapentin to help with pain and HA- make changes gradually due to hx of  encephalopathy -3/5 plan to wean oxycodone during admission- family reports she becomes easily addicted to these types of medication -3/6 continue current pain medications for now  -3/7 Voltaren gel for L shoulder pain  4. Mood/Behavior/Sleep: LCSW to follow for evaluation and support.             --Trazodone '100mg'$  QHS and melatonin '5mg'$  QHS PRN for  insomnia/mood  -09/04/22 placed sign on door to minimize interruptions 11p-5a; changed CBG checks as below to minimize interruptions             -antipsychotic agents:  Luvox '100mg'$  QHS and Lamictal '200mg'$  QD  -Continue Aricept '10mg'$  QD  -Seen by neuropsych-appreciate assistance  -3/1 increase melatonin to '10mg'$  dose QHS PRN  -3/4 decrease melatonin back to '5mg'$  dose QHS PRN  3/5 family reports she is getting fair amount of sleep, melatonin '10mg'$  caused her to be too sedated and may have caused nausea, continue '5mg'$  dose  5. Neuropsych/cognition: This patient is not fully capable of making decisions on her own behalf. 6. Skin/Wound Care: WOC to follow for wound VAC changes --MASD managed by fecal system-- flexiseal clogged 09/04/22 AM but nursing states it's now working; monitor--might be able to d/c soon? -2/21 WOC stopped VAC, start aquacel and dressing -see flexiseal discussion below, #17 -2/27 WOC following, slough pulled away  revealing 2cm deep defect -2/29 Spoke with Dr. Zada Finders today about wound depth, continue local wound care- continue to monitor, continue abx per ID,  no plan for procedure at this time -3/4 Seen by [facility] nursing- continue aquacel, abd pad/tape, apprecaite assistance -3/7 WOC rec medihoney, fill wound with moistened gauza, distal portion of wound to be backed with silver hydrofiber, appreciate assistance  7. Fluids/Electrolytes/Nutrition: Monitor I/O. Continue tube feeds-->likely contributing to diarrhea             -monitor intake closely, continue vitamins -2/19 discontinue core track, Consult dietician -2/27 decrease BMP to every other day  8.Sepsis w/ osteomyelitis/meningitis/encephalitis: Continue Cefepime 2g q8h, Vanc '1250mg'$  q12h, and Flagyl '500mg'$  q8h for at least 6 weeks --3/1 per ID continue Vanc+Cefepime until 09/30/22 then switch to suppressive PO Levaquin '750mg'$  QD + Doxycycline '100mg'$  BID until f/up outpatient, Dr. [name] appt 10/11/22 at 2pm; metronidazole has been discontinued as of 09/16/22 (was primarily for PNA, no longer needed)  9. H/o PVT:  Monitor BP/heart rate TID--on coreg 12.'5mg'$  BID, Labetalol '10mg'$  q2h PRN  -09/04/22 BP/HR fairly well controlled, cont regimen, monitor  -2/23  BP has been a little above goal, increase coreg dose to '25mg'$  BID  -3/5 BP/HR with fair control, continue to monitor     09/23/2022    6:40 AM 09/23/2022    6:28 AM 09/22/2022    7:29 PM  Vitals with BMI  Weight 147 lbs 11 oz    BMI [id number]    Systolic  [id number]  Diastolic  91 82  Pulse  82 101      10. Urinary retention: Has failed 4 voiding trails. Has had foley since 12/23             -maintain foley cath, foley mgt per protocol -- Continue Flomax 0.'4mg'$  qPM. Will d/c urecholine.   11. VDRF s/p trach: Tolerating PMSV well -I ordered downsizing to CFS # 4 today--tolerated well -continue ATC w/ pulmonary hygiene -begin day time plugging of trach tomorrow. Decannulate soon. -Trial of capping with  respiratory-  can trial 24 hour capping today -2/22 Leave trach uncapped tonight -2/28 plan for  decannulation today, has had trach capped for >72hours -2/29 Lurline Idol was  removed - doing well   12. T2DM: Monitor BS ac/hs and use SSI for elevated BS.              --continue tube feeds with supplements between meals due to poor intake             --continue liberalized diet for now. -09/04/22 CBGs looking great, Changed SSI and CBG checks to TID AC + QHS to minimize interruptions overnight, since tube feeds d/c'd -3/7 well controlled   CBG (last 3)  Recent Labs    09/22/22 1647 09/22/22 2108 09/23/22 0634  GLUCAP 111* 125* 153*     13. Hypokalemia: Recurrent likely due to diarrhea/intake.              --will add standing dose BID -->recheck labs on 02/17 and 02/19  -09/04/22 K+ 4.0, continue Klor 20mq BID, monitor on weekly labs -09/05/22 doesn't like Klor packet but no great alternative (Phos-Nak would have to be heavily dosed to get equivalent K+, so only other alternative is IV-- will hold off for now) -2/19 Increase K+ supplement to 458m BID, could consider change to tablet however this is a large tablet and she had trouble swallowing it previously so continue packet for now -2/20, discussed with pharmacy, change to tablet potassium, IV K+ ordered , daily labs -2/23 K+ down to 3.4, increase K+ supplement 2092mfrom BID to TID -2/26 stable at 4.2 -09/18/22 stable K+ 4.2 on 09/17/22, wants to change to BID dosing so switched Klor to 92m52mID (still will have 60mE24mtal/day)  -09/21/22 K+ stable 4.4, continue to monitor  14. Anemia of critical illness: Hgb 7.5-8.9 range -2/26 stable at HGB 10, monitor on weekly labs, next 09/20/22  15. Hyponatremia/hypochloremia: Improving 125/87-->130/94.  -09/04/22 Na 129, Cl 95, both stable, continue salt tabs 1g BID -Cortisol testing done during hospitalization (13.8 on 08/30/22), was normal, but daughter and pt want to make sure this is followed  up--defer to weekday team regarding ongoing ?Addison's testing -2/21 129, a little decreased, fluid restriction started -2/28 stable at 131 -09/21/22 Na stable at 130, continue to follow with  q48h BMPs- adjust to 8am labs Fluid restrictions liberalized to 1500ml 48m Encephalopathy/Delirium: Continues to have intermittent confusion--but much improved compared to 3 days ago per family.  --Continue delirium precautions.  17. Loose stools likely d/t TF and abx.             -reducing TF as above.  -flexiseal clogged 09/04/22 AM but nursing states it's now working; monitor--might be able to d/c soon? -add probiotics and fiber-- fiber changed to [place] on 09/04/22 since this should cause less clogging of Cortrak -Lomotil 1-2 tabs QID PRN -2/21 improved, rectal tube to help keep wound clean -2/22 continue rectal tube for now -09/11/22 rectal tube dislodged, stools clogging it d/t more formed, d/c for now and see how she does with bedpan, if soiling near wound then needs replacement of flexiseal -09/12/22 stools type 5-6, not super frequent, using diaper not bedpan-- looks like no stool is getting close to wound, but advised trying bedpan. Advised nursing that if stool becomes more frequent or more type 6-7, then flexiseal would need to be replaced. Monitor closely.  -2/29 LBM today, formed -3/7 LBM today, large. Having regular Bms overall  18. Hypothyroidism: continue synthroid 88mcg 20m19. Chronic combined CHF, BLE Swelling: recent 2D echo done 08/20/2022 showed apical hypokinesis,  LVEF 40-45%, RV function was reduced. Intermittently diuresed during hospitalization, most recently given '40mg'$  IV lasix on 09/03/22 -09/04/22 noted to still have BLE swelling, start '20mg'$  PO lasix and monitor lytes/BP/swelling -3/5 wt a little up, monitor tend -3/6 asked nutrition to stop milk/carnation- she wasn't drinking but it was counting toward her fluid restriction -3/7 wt stable, fluid restrictions  liberalized to 151m Filed Weights   09/20/22 0605 09/21/22 0648 09/23/22 0640  Weight: 69.3 kg 69.9 kg 67 kg    20. Oral ulcer  --2/28 Orajel for oral ulcer discomfort  LOS: 20 days A FACE TO FACE EVALUATION WAS PERFORMED  YJennye Boroughs3/01/2023, 10:41 AM

## 2022-09-23 NOTE — Progress Notes (Signed)
Physical Therapy Session Note  Patient Details  Name: [name] MRN: [id number] Date of Birth: [birth year]  Today's Date: 09/23/2022 PT Individual Time: 0920-1015 PT Individual Time Calculation (min): 55 min   Short Term Goals: Week 2:  PT Short Term Goal 1 (Week 2): Pt will perform bed mobility with ModA +1. PT Short Term Goal 2 (Week 2): Pt will perform sit<>stand with MaxA +1. PT Short Term Goal 3 (Week 2): Pt will perform seat-to-seat transfers with MaxA +1 PT Short Term Goal 4 (Week 2): Pt with perform standing balance with alternating bil/single UE support for 1-2 minutes. PT Short Term Goal 5 (Week 2): Pt will initiate pre-gait training.  Skilled Therapeutic Interventions/Progress Updates:     Pt misses 20 minutes of skilled PT due to requesting to finish breakfast. Upon return, pt agrees to therapy. Reports pain in back. PT provides repositioning, mobility, and rest breaks to manage pain. Pt perform supine to R sidelying to sit, with modA/maxA and cues for body mechanics and sequencing. Wound care nurse present to assess back wound. Pt sits at EOB with CGA as wound is assessed. Following, pt performs sit to stand with maxA +1 and ambulates x2' with maxA +1, transferring to [facility] with cues for posture, positioning, and sequencing. WC transport to gym. Pt performs multiple sit to stand reps with EVA walker, with cues for hand placement, initiation, and sequencing. Pt initially requires modA/maxA but improves to minA/modA, with PT facilitating forward weight shifting and hip extension. Pt ambulates 2x5' with EVA walker and maxA +1, with cues for hip extension, lateral weight shifting, and progression of contralateral lower extremity. Extended seated rest breaks between bouts. Pt ambulates x5' with maxA +1 without AD, with pt "hugging" PT and PT providing manual facilitation of upright posture and hip extension. WC transport back to room. TotalA for stand step transfer back to bed. maxA for  sit to supine. PT positions pt in L sidelying to offload wound on back. Left with alarm intact and all needs within reach.   Therapy Documentation Precautions:  Precautions Precautions: Fall, Back Precaution Comments: trach, rectal tube, foley Required Braces or Orthoses: Other Brace Other Brace: prevlon boots Restrictions Weight Bearing Restrictions: No Other Position/Activity Restrictions: external rotation R hip at baseline at all times, RLE shorter than LLE, pt ambulated household distances PTA, w/c for community   Therapy/Group: Individual Therapy  Breck Coons, PT, DPT 09/23/2022, 5:12 PM

## 2022-09-23 NOTE — Progress Notes (Signed)
Pharmacy Antibiotic Note   [name] is a 67 y.o. 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   Continues on Vancomycin and Cefepime    Last Vancomycin trough 17 mcg/mL (09/20/2022).  Renal function remains stable.  Afebrile, WBC WNL. Next Vanco trough 09/27/2022   Plan: Continue vanc '1000mg'$  IV Q12H Continue Cefepime 2g IV Q8H and Flagyl '500mg'$  IV Q8H Monitor renal function, culture results, weekly vanc trough  Thank you for involving pharmacy in this patient's care.  Vaughan Basta BS, PharmD, BCPS Clinical Pharmacist 09/23/2022 6:44 AM  Contact: [phone] after 3 PM  "Be curious, not judgmental..." -Jamal Maes

## 2022-09-23 NOTE — Consult Note (Addendum)
[place] Nurse wound follow up Wound type: lumbar dehiscence  Measurement: 1.  Proximal portion 1.4 cms x 1 cm x 1.5 cms 60% yellow slough 40% pink moist  2.  Distal portion 0.5 cm x 0.5 cm x 0.5 cm 100% pink moist   Drainage (amount, consistency, odor) minimal serosanguinous  Periwound: intact, healed incision  Dressing procedure/placement/frequency:  Proximal portion of wound clean with NS, apply Medihoney to wound bed daily using Q tip applicator, fill rest of wound with NS moistened 2 x 2 gauze and cover with dry gauze.  Cover entire wound with ABD pad and tape.    Distal portion of wound pack with small piece of silver hydrofiber Kellie Simmering [phone]) leaving tail extending from defect.  Cover with ABD pad and tape as above.    Discussed plan of care with patient and son as well as bedside nurse.   WOC will continue to follow this patient twice weekly to monitor wound.  Thank you,    Ellyse Rotolo MSN, RN-BC, [employer]

## 2022-09-24 DIAGNOSIS — R0789 Other chest pain: Secondary | ICD-10-CM

## 2022-09-24 LAB — GLUCOSE, CAPILLARY
Glucose-Capillary: 117 mg/dL — ABNORMAL HIGH (ref 70–99)
Glucose-Capillary: 158 mg/dL — ABNORMAL HIGH (ref 70–99)
Glucose-Capillary: 134 mg/dL — ABNORMAL HIGH (ref 70–99)
Glucose-Capillary: 100 mg/dL — ABNORMAL HIGH (ref 70–99)

## 2022-09-24 LAB — BASIC METABOLIC PANEL
Anion gap: 8 (ref 5–15)
BUN: 8 mg/dL (ref 8–23)
CO2: 23 mmol/L (ref 22–32)
Calcium: 9.1 mg/dL (ref 8.9–10.3)
Chloride: 99 mmol/L (ref 98–111)
Creatinine, Ser: 0.32 mg/dL — ABNORMAL LOW (ref 0.44–1.00)
GFR, Estimated: >60 mL/min (ref >60)
Glucose, Bld: 126 mg/dL — ABNORMAL HIGH (ref 70–99)
Potassium: 4 mmol/L (ref 3.5–5.1)
Sodium: 130 mmol/L — ABNORMAL LOW (ref 135–145)

## 2022-09-24 MED ORDER — LIDOCAINE 5 % EX PTCH
1.0000 | MEDICATED_PATCH | CUTANEOUS | Status: DC
  Administered 2022-09-24 – 2022-10-01 (×6): 1 via TRANSDERMAL
  Filled 2022-09-24 (×6): qty 1

## 2022-09-24 NOTE — Progress Notes (Signed)
PROGRESS NOTE   Subjective/Complaints:  Pt reports some soreness R chest wall over ribs after transfer by nursing last week.  Dr. Zada Finders was contacted about wound care orders and saw pt today.     ROS:  +HA- intermittent,  + back pain+ L shoulder pain,  + insomnia, +LE swelling-improving + decreased memory- gradually improving. Denies fevers, malaise, chills, cough, CP, SOB, abd pain, N/V/C, diarrhea, new/worsening paresthesias/weakness, vision changes or any other complaints at this time.    Objective:   DG Chest 2 View  Result Date: 09/22/2022 CLINICAL DATA:  Previous abnormal chest x-ray, history of left pleural effusion EXAM: CHEST - 2 VIEW COMPARISON:  09/03/2022 FINDINGS: Single frontal view of the chest demonstrates stable right-sided PICC. The tracheostomy tube and enteric catheter seen previously are no longer identified. Cardiac silhouette is stable. Increased density in the retrocardiac region consistent with chronic eventration of the left hemidiaphragm. Consolidation at the left lung base most consistent with compressive atelectasis as seen on prior CT. No definite airspace disease on this exam. No large effusion or pneumothorax. No acute bony abnormality. Stable left shoulder arthroplasty. IMPRESSION: 1. Chronic elevation of the left hemidiaphragm with compressive atelectasis at the left lung base. 2. No acute intrathoracic process. Electronically Signed   By: Randa Ngo M.D.   On: 09/22/2022 16:27   No results for input(s): "WBC", "HGB", "HCT", "PLT" in the last 72 hours.    No results for input(s): "NA", "K", "CL", "CO2", "GLUCOSE", "BUN", "CREATININE", "CALCIUM" in the last 72 hours.   Intake/Output Summary (Last 24 hours) at 09/24/2022 0828 Last data filed at 09/24/2022 0811 Gross per 24 hour  Intake 250 ml  Output 3800 ml  Net -3550 ml         Physical Exam: Vital Signs Blood pressure (!) 148/81, pulse  85, temperature 98.6 F (37 C), temperature source Oral, resp. rate 17, height '5\' 1"'$  (1.549 m), weight 68.4 kg, SpO2 98 %.  Constitutional:  thin, NAD, lying in bed HENT: [place]/AT,  mucus membranes a little dry  Eyes: EOMI, conjunctiva clear, PERRL Neck:     Comments: Dry dressing over trach site.  Cardiovascular: RRR, soft murmur heard (VSD), trace BLE swelling-improved Pulmonary: CTAB no w/r/r, no increased WOB Abdominal: Soft, nontender, nondistended, positive bowel sounds/normal active Genitourinary:    Comments:Sacral wound dressing clean  foley in place-yellow urine Neurological: Follows commands, memory- decreased at times but stable compared to last week,  Alert and oriented x3, no hypertonia noted, CN 2-12 grossly intact Psychiatric:     appropriate, normal affect  MSK: tenderness to palpation anterior L shoulder, mild pain with passive end ROM in abduction, internal and external rotation   Tenderness R chest wall  Assessment/Plan: 1. Functional deficits which require 3+ hours per day of interdisciplinary therapy in a comprehensive inpatient rehab setting. Physiatrist is providing close team supervision and 24 hour management of active medical problems listed below. Physiatrist and rehab team continue to assess barriers to discharge/monitor patient progress toward functional and medical goals  Care Tool:  Bathing  Bathing activity did not occur: Refused           Bathing assist  Upper Body Dressing/Undressing Upper body dressing   What is the patient wearing?: Hospital gown only    Upper body assist Assist Level: Total Assistance - Patient < 25%    Lower Body Dressing/Undressing Lower body dressing      What is the patient wearing?: Hospital gown only     Lower body assist Assist for lower body dressing: Total Assistance - Patient < 25%     Toileting Toileting Toileting Activity did not occur Landscape architect and hygiene only): N/A (no void or bm)  (using catheter and rectal tube)  Toileting assist Assist for toileting: Dependent - Patient 0%     Transfers Chair/bed transfer  Transfers assist  Chair/bed transfer activity did not occur: Safety/medical concerns  Chair/bed transfer assist level: Maximal Assistance - Patient 25 - 49%     Locomotion Ambulation   Ambulation assist   Ambulation activity did not occur: Safety/medical concerns          Walk 10 feet activity   Assist  Walk 10 feet activity did not occur: Safety/medical concerns        Walk 50 feet activity   Assist Walk 50 feet with 2 turns activity did not occur: Safety/medical concerns         Walk 150 feet activity   Assist Walk 150 feet activity did not occur: Safety/medical concerns         Walk 10 feet on uneven surface  activity   Assist Walk 10 feet on uneven surfaces activity did not occur: Safety/medical concerns         Wheelchair     Assist Is the patient using a wheelchair?: Yes Type of Wheelchair: Manual Wheelchair activity did not occur: Safety/medical concerns         Wheelchair 50 feet with 2 turns activity    Assist    Wheelchair 50 feet with 2 turns activity did not occur: Safety/medical concerns       Wheelchair 150 feet activity     Assist  Wheelchair 150 feet activity did not occur: Safety/medical concerns       Blood pressure (!) 148/81, pulse 85, temperature 98.6 F (37 C), temperature source Oral, resp. rate 17, height '5\' 1"'$  (1.549 m), weight 68.4 kg, SpO2 98 %.  Medical Problem List and Plan: 1. Functional deficits secondary to osteomyelitis, infection of lumbar surgical site complicated by meningitis/ventriculitis.             -pt also with significant deconditioning             -patient may not yet shower             -ELOS/Goals: 10/06/22, Min/Mod A with PT, OT, SLP  -Continue CIR, PT OT SL -3/1 Repeat MRI brain around 09/29/22 prior to stopping abx per Dr Zada Finders note  09/01/22  -Adjust labs to 8am   2.  Antithrombotics: -DVT/anticoagulation:  Pharmaceutical: Lovenox '40mg'$  QD             -antiplatelet therapy: N/a  3. Pain Management: Butrans 54mg/hr patch for pain control w/ Flexeril '5mg'$  TID, Tylenol PRN, Oxycodone 7.'5mg'$  q4h PRN, Gabapentin '100mg'$  TID  -09/04/22 increased flexeril to 7.'5mg'$  TID -09/05/22 schedule Oxycodone '5mg'$  q6h and leave 2.'5mg'$  q4h PRN dosing -2/26 tylenol PRN for HA -2/28 Fioricet ordered PRN headache -09/17/22 will change PRN oxycodone 2.'5mg'$  to percocet 5-'325mg'$ , consider increase gabapentin to help with pain and HA- make changes gradually due to hx of encephalopathy -3/5 plan to wean oxycodone during admission-  family reports she becomes easily addicted to these types of medication -3/6 continue current pain medications for now  -3/7 Voltaren gel for L shoulder pain -2/8 CXR negative, lidocaine for chest wall tenderness started  4. Mood/Behavior/Sleep: LCSW to follow for evaluation and support.             --Trazodone '100mg'$  QHS and melatonin '5mg'$  QHS PRN for  insomnia/mood  -09/04/22 placed sign on door to minimize interruptions 11p-5a; changed CBG checks as below to minimize interruptions             -antipsychotic agents:  Luvox '100mg'$  QHS and Lamictal '200mg'$  QD  -Continue Aricept '10mg'$  QD  -Seen by neuropsych-appreciate assistance  -3/1 increase melatonin to '10mg'$  dose QHS PRN  -3/4 decrease melatonin back to '5mg'$  dose QHS PRN  3/5 family reports she is getting fair amount of sleep, melatonin '10mg'$  caused her to be too sedated and may have caused nausea, continue '5mg'$  dose  5. Neuropsych/cognition: This patient is not fully capable of making decisions on her own behalf. 6. Skin/Wound Care: WOC to follow for wound VAC changes --MASD managed by fecal system-- flexiseal clogged 09/04/22 AM but nursing states it's now working; monitor--might be able to d/c soon? -2/21 WOC stopped VAC, start aquacel and dressing -see flexiseal discussion below,  #17 -2/27 WOC following, slough pulled away revealing 2cm deep defect -2/29 Spoke with Dr. Zada Finders today about wound depth, continue local wound care- continue to monitor, continue abx per ID,  no plan for procedure at this time -3/4 Seen by [facility]- continue aquacel, abd pad/tape, apprecaite assistance -3/7 WOC rec medihoney, fill wound with moistened gauza, distal portion of wound to be backed with silver hydrofiber, appreciate assistance 3/8 Dr. Zada Finders contacted about wound, feels like this is doing well overall, continue current wound care orders, appreciate assistance  7. Fluids/Electrolytes/Nutrition: Monitor I/O. Continue tube feeds-->likely contributing to diarrhea             -monitor intake closely, continue vitamins -2/19 discontinue core track, Consult dietician -2/27 decrease BMP to every other day  8.Sepsis w/ osteomyelitis/meningitis/encephalitis: Continue Cefepime 2g q8h, Vanc '1250mg'$  q12h, and Flagyl '500mg'$  q8h for at least 6 weeks --3/1 per ID continue Vanc+Cefepime until 09/30/22 then switch to suppressive PO Levaquin '750mg'$  QD + Doxycycline '100mg'$  BID until f/up outpatient, Dr. [name] appt 10/11/22 at 2pm; metronidazole has been discontinued as of 09/16/22 (was primarily for PNA, no longer needed)  9. H/o PVT:  Monitor BP/heart rate TID--on coreg 12.'5mg'$  BID, Labetalol '10mg'$  q2h PRN  -09/04/22 BP/HR fairly well controlled, cont regimen, monitor  -2/23  BP has been a little above goal, increase coreg dose to '25mg'$  BID  -3/5 BP/HR with fair control, continue to monitor     09/24/2022    6:21 AM 09/23/2022    7:46 PM 09/23/2022    3:16 PM  Vitals with BMI  Weight 150 lbs 13 oz    BMI [id number]    Systolic [id number]  Diastolic 81 90 85  Pulse 85 90 79      10. Urinary retention: Has failed 4 voiding trails. Has had foley since 12/23             -maintain foley cath, foley mgt per protocol -- Continue Flomax 0.'4mg'$  qPM. Will d/c urecholine.   11. VDRF s/p trach: Tolerating  PMSV well -I ordered downsizing to CFS # 4 today--tolerated well -continue ATC w/ pulmonary hygiene -begin day time plugging of trach tomorrow. Decannulate  soon. -Trial of capping with respiratory-  can trial 24 hour capping today -2/22 Leave trach uncapped tonight -2/28 plan for decannulation today, has had trach capped for >72hours -2/29 Lurline Idol was  removed - doing well   12. T2DM: Monitor BS ac/hs and use SSI for elevated BS.              --continue tube feeds with supplements between meals due to poor intake             --continue liberalized diet for now. -09/04/22 CBGs looking great, Changed SSI and CBG checks to TID AC + QHS to minimize interruptions overnight, since tube feeds d/c'd -3/8 well controlled, consider decrease glucose checks to daily, would plan to discuss with patient so she is awake before placing order   CBG (last 3)  Recent Labs    09/23/22 1635 09/23/22 2137 09/24/22 0624  GLUCAP 123* 106* 117*     13. Hypokalemia: Recurrent likely due to diarrhea/intake.              --will add standing dose BID -->recheck labs on 02/17 and 02/19  -09/04/22 K+ 4.0, continue Klor 47mq BID, monitor on weekly labs -09/05/22 doesn't like Klor packet but no great alternative (Phos-Nak would have to be heavily dosed to get equivalent K+, so only other alternative is IV-- will hold off for now) -2/19 Increase K+ supplement to 413m BID, could consider change to tablet however this is a large tablet and she had trouble swallowing it previously so continue packet for now -2/20, discussed with pharmacy, change to tablet potassium, IV K+ ordered , daily labs -2/23 K+ down to 3.4, increase K+ supplement 2028mfrom BID to TID -2/26 stable at 4.2 -09/18/22 stable K+ 4.2 on 09/17/22, wants to change to BID dosing so switched Klor to 76m33mID (still will have 60mE6mtal/day)  -3/8 K+ stable at 4.0  14. Anemia of critical illness: Hgb 7.5-8.9 range -2/26 stable at HGB 10, monitor on weekly  labs, next 09/20/22  15. Hyponatremia/hypochloremia: Improving 125/87-->130/94.  -09/04/22 Na 129, Cl 95, both stable, continue salt tabs 1g BID -Cortisol testing done during hospitalization (13.8 on 08/30/22), was normal, but daughter and pt want to make sure this is followed up--defer to weekday team regarding ongoing ?Addison's testing -2/21 129, a little decreased, fluid restriction started -2/28 stable at 131 -09/21/22 Na stable at 130, continue to follow with  q48h BMPs- adjust to 8am labs Fluid restrictions liberalized to 1500ml 77m Na stable at 130  16. Encephalopathy/Delirium: Continues to have intermittent confusion--but much improved compared to 3 days ago per family.  --Continue delirium precautions.  17. Loose stools likely d/t TF and abx.             -reducing TF as above.  -flexiseal clogged 09/04/22 AM but nursing states it's now working; monitor--might be able to d/c soon? -add probiotics and fiber-- fiber changed to NutrisFish HawkD on 09/04/22 since this should cause less clogging of Cortrak -Lomotil 1-2 tabs QID PRN -2/21 improved, rectal tube to help keep wound clean -2/22 continue rectal tube for now -09/11/22 rectal tube dislodged, stools clogging it d/t more formed, d/c for now and see how she does with bedpan, if soiling near wound then needs replacement of flexiseal -09/12/22 stools type 5-6, not super frequent, using diaper not bedpan-- looks like no stool is getting close to wound, but advised trying bedpan. Advised nursing that if stool becomes more frequent or more type 6-7, then  flexiseal would need to be replaced. Monitor closely.  -2/29 LBM today, formed -3/7 LBM today, large. Having regular Bms overall  18. Hypothyroidism: continue synthroid 2mg QD  19. Chronic combined CHF, BLE Swelling: recent 2D echo done 08/20/2022 showed apical hypokinesis, LVEF 40-45%, RV function was reduced. Intermittently diuresed during hospitalization, most recently given '40mg'$  IV  lasix on 09/03/22 -09/04/22 noted to still have BLE swelling, start '20mg'$  PO lasix and monitor lytes/BP/swelling -3/5 wt a little up, monitor tend -3/6 asked nutrition to stop milk/carnation- she wasn't drinking but it was counting toward her fluid restriction -3/7 wt stable, fluid restrictions liberalized to 15031mFiled Weights   09/21/22 0648 09/23/22 0640 09/24/22 0621  Weight: 69.9 kg 67 kg 68.4 kg    20. Oral ulcer  --2/28 Orajel for oral ulcer discomfort  LOS: 21 days A FACE TO FACE EVALUATION WAS PERFORMED  YuJennye Boroughs/02/2023, 8:28 AM

## 2022-09-24 NOTE — Progress Notes (Addendum)
Speech Language Pathology Daily Session Note  Patient Details  Name: [name] MRN: [id number] Date of Birth: 12/03/1955  Today's Date: 09/24/2022 SLP Individual Time: 1015-1100 SLP Individual Time Calculation (min): 45 min  Short Term Goals: Week 4: SLP Short Term Goal 1 (Week 4): Pt will use external memory aids to recall important information with sup-to-min A verbal cues. SLP Short Term Goal 2 (Week 4): Pt will demonstrate awareness of errors and repair with min A verbal cues. SLP Short Term Goal 3 (Week 4): Pt will participate in therapeutic task for 15 minute intervals with min A verbal cues. SLP Short Term Goal 4 (Week 4): Patient will complete functional and semi-complex problem solving tasks with sup-to-min A verbal cues  Skilled Therapeutic Interventions: Pt seen this date for skilled ST intervention targeting cognitive goals outlined in care plan. Pt received awake/alert and recently finished peri-care with nursing following large bowel movement. Agreeable to intervention at bedside.  SLP facilitated today's by providing overall Mod A verbal and visual cue to utilize compensatory memory strategies to recall therapeutic word list of 6 words with 100% accuracy given implementation of spaced retrieval techniques and errorless learning interventions. Max A for writing information in memory journal for later recall. Completed basic money calculations by counting pre-determined bills and coins with ~70% accuracy given Sup A; improved to 100% accuracy given Min A verbal cues.  Continues to report poor recall and attention. Provided education re: cognitive pyramid and why it may be hard to recall information. Continues to benefit from Min A verbal cues for redirection to task intermittently. Missed 15 minutes due to nursing care.  Pt left in bed with all safety measures activated, call bell within reach, and all immediate needs met. Continue per current ST POC.   Pain No pain  reported; NAD  Therapy/Group: Individual Therapy  Shane Badeaux A Nathanael Krist 09/24/2022, 4:29 PM

## 2022-09-24 NOTE — Progress Notes (Signed)
Physical Therapy Weekly Progress Note  Patient Details  Name: [name] MRN: [id number] Date of Birth: [birth year]  Beginning of progress report period: September 14, 2022 End of progress report period: September 24, 2022  Today's Date: 09/24/2022 PT Individual Time: [id number] PT Individual Time Calculation (min): 30 min  and Today's Date: 09/24/2022 PT Missed Time: 60 Minutes Missed Time Reason: Patient unwilling to participate  Patient has met 4 of 5 short term goals.  Pt is progressing toward mobility goals, improving independence with bed mobility, balance, transfers, and initiating ambulation. Pt still requires maxA for bed mobility but has improved initiation and efficiency of movement. Pt able to perform sit to stand transfers at times with modA and stand step transfers with maxA. Pt ambulating short distances with maxA +2, up to 5' at a time. Pt will benefit from hands on family education prior to DC.   Patient continues to demonstrate the following deficits muscle weakness, decreased cardiorespiratoy endurance, decreased memory, and decreased sitting balance, decreased standing balance, decreased postural control, and decreased balance strategies and therefore will continue to benefit from skilled PT intervention to increase functional independence with mobility.  Patient progressing toward long term goals..  Continue plan of care.  PT Short Term Goals Week 2:  PT Short Term Goal 1 (Week 2): Pt will perform bed mobility with ModA +1. PT Short Term Goal 1 - Progress (Week 2): Progressing toward goal PT Short Term Goal 2 (Week 2): Pt will perform sit<>stand with MaxA +1. PT Short Term Goal 2 - Progress (Week 2): Met PT Short Term Goal 3 (Week 2): Pt will perform seat-to-seat transfers with MaxA +1 PT Short Term Goal 3 - Progress (Week 2): Met PT Short Term Goal 4 (Week 2): Pt with perform standing balance with alternating bil/single UE support for 1-2 minutes. PT Short Term Goal 4 -  Progress (Week 2): Met PT Short Term Goal 5 (Week 2): Pt will initiate pre-gait training. PT Short Term Goal 5 - Progress (Week 2): Met Week 3:  PT Short Term Goal 1 (Week 3): Pt will complete bed mobility with modA consistently. PT Short Term Goal 2 (Week 3): Pt will completes bed to chair transfers iwth modA consistently. PT Short Term Goal 3 (Week 3): Pt will ambulate x10' with modA +2 and LRAD.  Skilled Therapeutic Interventions/Progress Updates:  Ambulation/gait training;Discharge planning;Functional mobility training;Psychosocial support;Therapeutic Activities;Visual/perceptual remediation/compensation;Wheelchair propulsion/positioning;Therapeutic Exercise;Skin care/wound management;Neuromuscular re-education;Disease management/prevention;Balance/vestibular training;Cognitive remediation/compensation;DME/adaptive equipment instruction;Pain management;Splinting/orthotics;UE/LE Strength taining/ROM;Community reintegration;Functional electrical stimulation;Patient/family education;Stair training;UE/LE Coordination activities   1st Session: Pt refuses PT at this time due to upset stomach. Per RN pt had just had large bout of diarrhea.  2nd Session: Pt received supine in bed and agrees to therapy. No complaint of pain. Supine to sit with maxA and cues for hand placement, sequencing, and body mechanics. Sit to stand form EOB with maxA +1 with manual support at hips. Daughter helps to pull up pants and pt performs stand step transfer to [facility] with maxA +1. WC transport to gym. Pt completes x2 reps of sit to stand in parallel bars with modA and cues for hand placement, initiation, and body mechanics. Pt able to remain standing 1-2 minutes at a time, with cues for posture, hip and trunk extension, and hand placement for safe return to sitting. WC transport back to room. Pt left seated in WC with all needs within reach.  Therapy Documentation Precautions:  Precautions Precautions: Fall, Back Precaution  Comments: trach, rectal tube, foley Required  Braces or Orthoses: Other Brace Other Brace: prevlon boots Restrictions Weight Bearing Restrictions: No Other Position/Activity Restrictions: external rotation R hip at baseline at all times, RLE shorter than LLE, pt ambulated household distances PTA, w/c for community   Therapy/Group: Individual Therapy  Breck Coons, PT, DPT 09/24/2022, 4:40 PM

## 2022-09-24 NOTE — Progress Notes (Signed)
Occupational Therapy Session Note  Patient Details  Name: [name] MRN: [id number] Date of Birth: 11-26-1955  Today's Date: 09/24/2022 OT Individual Time: 1331-1414 OT Individual Time Calculation (min): 43 min    Short Term Goals: Week 4:  OT Short Term Goal 1 (Week 4): Pt will complete stand pivot consistently with mod A OT Short Term Goal 2 (Week 4): Pt will complete sit > stand with mod A consistently OT Short Term Goal 3 (Week 4): Pt will stand statically with BUE support and no more than min A  Skilled Therapeutic Interventions/Progress Updates:     Pt received supine in bed presenting to be in good spirits and receptive to skilled OT session. Pt reporting shoulder pain and head ache with RN in/out at beginning of session to provide pain medications- OT offering changes to environment, repositioning, and rest breaks to accommodate for pain. Pt requesting to take sponge bath during session today. Pt transitioned to EOB with mod A to lift trunk and bring legs fully off EOB. Sitting EOB Pt able to maintain dynamic sitting balance with CGA to SBA. Bathing completed EOB for dynamic sitting balance challenge to increase cor strength for BADLs. Pt doffed shirt supervision. Pt able to complete UB bathing with close supervision with OT assisting with back to maintain spinal precautions. Pt able to wash her upper legs and OT assisting with lower BLEs. Pt groomed her hair, applied lotion, and donned clean shirt. Throughout session, OT facilitated Pt participating in light hearted conversations to support Pt moral as she discussed stress of psychosocial factors with noted improvement in moral noted. Pt transitioned back to bed mod A to lift BLEs into bed. Pt completed rolls in bed R/L to assist with changing brief with mod A required using bed features. Pt was left resting in bed with call bell in reach, bed alarm on, and all needs met.   Therapy Documentation Precautions:   Precautions Precautions: Fall, Back Precaution Comments: trach, rectal tube, foley Required Braces or Orthoses: Other Brace Other Brace: prevlon boots Restrictions Weight Bearing Restrictions: No Other Position/Activity Restrictions: external rotation R hip at baseline at all times, RLE shorter than LLE, pt ambulated household distances PTA, w/c for community General: General PT Missed Treatment Reason: Patient unwilling to participate Vital Signs: Therapy Vitals Temp: 98 F (36.7 C) Pulse Rate: 83 Resp: 16 BP: (Abnormal) 141/76 Patient Position (if appropriate): Lying Oxygen Therapy SpO2: 100 % O2 Device: Room Air Pain:   ADL: ADL ADL Comments: unable to complete on eval. Pt refused bathing as she was determined to watch a basketball game, can only use hospital gown at this time due to multiple lines, rectal tube.   Therapy/Group: Individual Therapy  Janey Genta 09/24/2022, 4:58 PM

## 2022-09-25 DIAGNOSIS — T148XXA Other injury of unspecified body region, initial encounter: Secondary | ICD-10-CM

## 2022-09-25 DIAGNOSIS — R739 Hyperglycemia, unspecified: Secondary | ICD-10-CM

## 2022-09-25 LAB — GLUCOSE, CAPILLARY
Glucose-Capillary: 128 mg/dL — ABNORMAL HIGH (ref 70–99)
Glucose-Capillary: 142 mg/dL — ABNORMAL HIGH (ref 70–99)
Glucose-Capillary: 109 mg/dL — ABNORMAL HIGH (ref 70–99)
Glucose-Capillary: 176 mg/dL — ABNORMAL HIGH (ref 70–99)

## 2022-09-25 NOTE — Progress Notes (Signed)
PROGRESS NOTE   Subjective/Complaints: Pt doing pretty well today, pain managed alright with meds for the most part, states "not always" but then admits she might not be asking for her PRNs as much as she wants to because she can't always remember when she last took it and doesn't want to annoy nursing staff. Advised to ask for meds when she needs them and nursing staff will let her know if it's too soon.  Slept alright. LBM was yesterday and still not diarrhea.  Foley still in place, no issues. Denies any other issues or concerns today.     ROS:  +HA- intermittent,  + back pain + L shoulder pain,  + insomnia-improving, +LE swelling-improved + decreased memory- gradually improving. Denies fevers, malaise, chills, cough, CP, SOB, abd pain, N/V/C, diarrhea, new/worsening paresthesias/weakness, vision changes or any other complaints at this time.    Objective:   No results found. No results for input(s): "WBC", "HGB", "HCT", "PLT" in the last 72 hours.    Recent Labs    09/24/22 0811  NA 130*  K 4.0  CL 99  CO2 23  GLUCOSE 126*  BUN 8  CREATININE 0.32*  CALCIUM 9.1    Intake/Output Summary (Last 24 hours) at 09/25/2022 1340 Last data filed at 09/25/2022 1000 Gross per 24 hour  Intake 337 ml  Output 2000 ml  Net -1663 ml        Physical Exam: Vital Signs Blood pressure 136/75, pulse 82, temperature 98.5 F (36.9 C), temperature source Oral, resp. rate 16, height '5\' 1"'$  (1.549 m), weight 68.4 kg, SpO2 97 %.  Constitutional:  thin, NAD, lying in bed doing therapies HENT: [place]/AT,  MMM today  Eyes: EOMI, conjunctiva clear, PERRL Neck:     Comments: Dry dressing over trach site.  Cardiovascular: RRR, soft murmur heard (VSD), no BLE swelling noted this morning Pulmonary: CTAB no w/r/r, no increased WOB Abdominal: Soft, nontender, nondistended, positive bowel sounds/normal active Genitourinary:    Comments:Sacral wound  dressing clean-not reassessed today  foley in place-yellow urine  PRIOR EXAM: Neurological: Follows commands, memory- decreased at times but stable compared to last week,  Alert and oriented x3, no hypertonia noted, CN 2-12 grossly intact Psychiatric:     appropriate, normal affect  MSK: tenderness to palpation anterior L shoulder, mild pain with passive end ROM in abduction, internal and external rotation   Tenderness R chest wall  Assessment/Plan: 1. Functional deficits which require 3+ hours per day of interdisciplinary therapy in a comprehensive inpatient rehab setting. Physiatrist is providing close team supervision and 24 hour management of active medical problems listed below. Physiatrist and rehab team continue to assess barriers to discharge/monitor patient progress toward functional and medical goals  Care Tool:  Bathing  Bathing activity did not occur: Refused           Bathing assist       Upper Body Dressing/Undressing Upper body dressing   What is the patient wearing?: Hospital gown only    Upper body assist Assist Level: Total Assistance - Patient < 25%    Lower Body Dressing/Undressing Lower body dressing      What is the patient wearing?:  Hospital gown only     Lower body assist Assist for lower body dressing: Total Assistance - Patient < 25%     Toileting Toileting Toileting Activity did not occur Landscape architect and hygiene only): N/A (no void or bm) (using catheter and rectal tube)  Toileting assist Assist for toileting: Dependent - Patient 0%     Transfers Chair/bed transfer  Transfers assist  Chair/bed transfer activity did not occur: Safety/medical concerns  Chair/bed transfer assist level: Maximal Assistance - Patient 25 - 49%     Locomotion Ambulation   Ambulation assist   Ambulation activity did not occur: Safety/medical concerns          Walk 10 feet activity   Assist  Walk 10 feet activity did not occur:  Safety/medical concerns        Walk 50 feet activity   Assist Walk 50 feet with 2 turns activity did not occur: Safety/medical concerns         Walk 150 feet activity   Assist Walk 150 feet activity did not occur: Safety/medical concerns         Walk 10 feet on uneven surface  activity   Assist Walk 10 feet on uneven surfaces activity did not occur: Safety/medical concerns         Wheelchair     Assist Is the patient using a wheelchair?: Yes Type of Wheelchair: Manual Wheelchair activity did not occur: Safety/medical concerns         Wheelchair 50 feet with 2 turns activity    Assist    Wheelchair 50 feet with 2 turns activity did not occur: Safety/medical concerns       Wheelchair 150 feet activity     Assist  Wheelchair 150 feet activity did not occur: Safety/medical concerns       Blood pressure 136/75, pulse 82, temperature 98.5 F (36.9 C), temperature source Oral, resp. rate 16, height '5\' 1"'$  (1.549 m), weight 68.4 kg, SpO2 97 %.  Medical Problem List and Plan: 1. Functional deficits secondary to osteomyelitis, infection of lumbar surgical site complicated by meningitis/ventriculitis.             -pt also with significant deconditioning             -patient may not yet shower             -ELOS/Goals: 10/06/22, Min/Mod A with PT, OT, SLP  -Continue CIR, PT OT SL -3/1 Repeat MRI brain around 09/29/22 prior to stopping abx per Dr Zada Finders note 09/01/22  -Adjust labs to 8am   2.  Antithrombotics: -DVT/anticoagulation:  Pharmaceutical: Lovenox '40mg'$  QD             -antiplatelet therapy: N/a  3. Pain Management: Butrans 68mg/hr patch for pain control w/ Flexeril '5mg'$  TID, Tylenol PRN, Oxycodone 7.'5mg'$  q4h PRN, Gabapentin '100mg'$  TID  -09/04/22 increased flexeril to 7.'5mg'$  TID -09/05/22 schedule Oxycodone '5mg'$  q6h and leave 2.'5mg'$  q4h PRN dosing -2/26 tylenol PRN for HA -2/28 Fioricet ordered PRN headache -09/17/22 will change PRN oxycodone  2.'5mg'$  to percocet 5-'325mg'$ , consider increase gabapentin to help with pain and HA- make changes gradually due to hx of encephalopathy -3/5 plan to wean oxycodone during admission- family reports she becomes easily addicted to these types of medication -3/6 continue current pain medications for now  -3/7 Voltaren gel for L shoulder pain -3/8 CXR negative, lidocaine for chest wall tenderness started -09/25/22 pain overall managed, no changes made, encouraged use of PRNs when she feels the  need  4. Mood/Behavior/Sleep: LCSW to follow for evaluation and support.             --Trazodone '100mg'$  QHS and melatonin '5mg'$  QHS PRN for  insomnia/mood  -09/04/22 placed sign on door to minimize interruptions 11p-5a; changed CBG checks as below to minimize interruptions             -antipsychotic agents:  Luvox '100mg'$  QHS and Lamictal '200mg'$  QD  -Continue Aricept '10mg'$  QD  -Seen by neuropsych-appreciate assistance  -3/1 increase melatonin to '10mg'$  dose QHS PRN  -3/4 decrease melatonin back to '5mg'$  dose QHS PRN -3/5 family reports she is getting fair amount of sleep, melatonin '10mg'$  caused her to be too sedated and may have caused nausea, continue '5mg'$  dose  5. Neuropsych/cognition: This patient is not fully capable of making decisions on her own behalf. 6. Skin/Wound Care: WOC to follow for wound VAC changes --MASD managed by fecal system-- flexiseal clogged 09/04/22 AM but nursing states it's now working; monitor--might be able to d/c soon? -2/21 WOC stopped VAC, start aquacel and dressing -see flexiseal discussion below, #17 -2/27 WOC following, slough pulled away revealing 2cm deep defect -2/29 Spoke with Dr. Zada Finders today about wound depth, continue local wound care- continue to monitor, continue abx per ID,  no plan for procedure at this time -3/4 Seen by [place]- continue aquacel, abd pad/tape, apprecaite assistance -3/7 WOC rec medihoney, fill wound with moistened gauza, distal portion of wound to be backed  with silver hydrofiber, appreciate assistance -3/8 Dr. Zada Finders contacted about wound, feels like this is doing well overall, continue current wound care orders, appreciate assistance  7. Fluids/Electrolytes/Nutrition: Monitor I/O. Continue tube feeds-->likely contributing to diarrhea             -monitor intake closely, continue vitamins -2/19 discontinue core track, Consult dietician -2/27 decrease BMP to every other day -09/25/22 BMP stable yesterday, monitor on q72h labs  8.Sepsis w/ osteomyelitis/meningitis/encephalitis: Continue Cefepime 2g q8h, Vanc '1250mg'$  q12h, and Flagyl '500mg'$  q8h for at least 6 weeks --3/1 per ID continue Vanc+Cefepime until 09/30/22 then switch to suppressive PO Levaquin '750mg'$  QD + Doxycycline '100mg'$  BID until f/up outpatient, Dr. [name] appt 10/11/22 at 2pm; metronidazole has been discontinued as of 09/16/22 (was primarily for PNA, no longer needed)  9. H/o PVT:  Monitor BP/heart rate TID--on coreg 12.'5mg'$  BID, Labetalol '10mg'$  q2h PRN  -09/04/22 BP/HR fairly well controlled, cont regimen, monitor  -2/23  BP has been a little above goal, increase coreg dose to '25mg'$  BID  -09/25/22 BP/HR with fair control, continue to monitor     09/25/2022    6:35 AM 09/24/2022    7:27 PM 09/24/2022    2:20 PM  Vitals with BMI  Systolic [id number]  Diastolic 75 87 76  Pulse 82 88 83      10. Urinary retention: Has failed 4 voiding trails. Has had foley since 12/23             -maintain foley cath, foley mgt per protocol -- Continue Flomax 0.'4mg'$  qPM. Will d/c urecholine.   11. VDRF s/p trach: Tolerating PMSV well -I ordered downsizing to CFS # 4 today--tolerated well -continue ATC w/ pulmonary hygiene -begin day time plugging of trach tomorrow. Decannulate soon. -Trial of capping with respiratory-  can trial 24 hour capping today -2/22 Leave trach uncapped tonight -2/28 plan for decannulation today, has had trach capped for >72hours -2/29 Lurline Idol was  removed - doing well   12.  T2DM: Monitor BS ac/hs and use SSI for elevated BS.              --continue tube feeds with supplements between meals due to poor intake             --continue liberalized diet for now. -09/04/22 CBGs looking great, Changed SSI and CBG checks to TID AC + QHS to minimize interruptions overnight, since tube feeds d/c'd -3/8 well controlled, consider decrease glucose checks to daily, would plan to discuss with patient so she is awake before placing order -09/25/22 CBGs well controlled; no change to CBG checks yesterday, will leave them in place over weekend, defer to weekday team   CBG (last 3)  Recent Labs    09/24/22 2045 09/25/22 0633 09/25/22 1133  GLUCAP 100* 128* 142*    13. Hypokalemia: Recurrent likely due to diarrhea/intake.              --will add standing dose BID -->recheck labs on 02/17 and 02/19  -09/04/22 K+ 4.0, continue Klor 49mq BID, monitor on weekly labs -09/05/22 doesn't like Klor packet but no great alternative (Phos-Nak would have to be heavily dosed to get equivalent K+, so only other alternative is IV-- will hold off for now) -2/19 Increase K+ supplement to 413m BID, could consider change to tablet however this is a large tablet and she had trouble swallowing it previously so continue packet for now -2/20, discussed with pharmacy, change to tablet potassium, IV K+ ordered , daily labs -2/23 K+ down to 3.4, increase K+ supplement 2095mfrom BID to TID -2/26 stable at 4.2 -09/18/22 stable K+ 4.2 on 09/17/22, wants to change to BID dosing so switched Klor to 30m57mID (still will have 60mE65mtal/day)  -3/8 K+ stable at 4.0  14. Anemia of critical illness: Hgb 7.5-8.9 range -2/26 stable at HGB 10, monitor on weekly labs, next 09/27/22  15. Hyponatremia/hypochloremia: Improving 125/87-->130/94.  -09/04/22 Na 129, Cl 95, both stable, continue salt tabs 1g BID -Cortisol testing done during hospitalization (13.8 on 08/30/22), was normal, but daughter and pt want to make sure this  is followed up--defer to weekday team regarding ongoing ?[facility]ison's testing -2/21 129, a little decreased, fluid restriction started -2/28 stable at 131 -09/21/22 Na stable at 130, continue to follow with  q48h BMPs- adjust to 8am labs Fluid restrictions liberalized to 1500ml 34m Na stable at 130, monitor on q72h labs  16. Encephalopathy/Delirium: Continues to have intermittent confusion--but much improved compared to 3 days ago per family.  --Continue delirium precautions.  17. Loose stools likely d/t TF and abx.             -reducing TF as above.  -flexiseal clogged 09/04/22 AM but nursing states it's now working; monitor--might be able to d/c soon? -add probiotics and fiber-- fiber changed to [place] on 09/04/22 since this should cause less clogging of Cortrak -Lomotil 1-2 tabs QID PRN -2/21 improved, rectal tube to help keep wound clean -2/22 continue rectal tube for now -09/11/22 rectal tube dislodged, stools clogging it d/t more formed, d/c for now and see how she does with bedpan, if soiling near wound then needs replacement of flexiseal -09/12/22 stools type 5-6, not super frequent, using diaper not bedpan-- looks like no stool is getting close to wound, but advised trying bedpan. Advised nursing that if stool becomes more frequent or more type 6-7, then flexiseal would need to be replaced. Monitor closely.  -2/29 LBM today, formed -09/25/22 LBM yesterday,  still formed. Monitor   18. Hypothyroidism: continue synthroid 52mg QD  19. Chronic combined CHF, BLE Swelling: recent 2D echo done 08/20/2022 showed apical hypokinesis, LVEF 40-45%, RV function was reduced. Intermittently diuresed during hospitalization, most recently given '40mg'$  IV lasix on 09/03/22 -09/04/22 noted to still have BLE swelling, start '20mg'$  PO lasix and monitor lytes/BP/swelling -3/5 wt a little up, monitor tend -3/6 asked nutrition to stop milk/carnation- she wasn't drinking but it was counting toward her fluid  restriction -3/7 wt stable, fluid restrictions liberalized to 15069m-09/25/22 wt stable, monitor Filed Weights   09/21/22 0648 09/23/22 0640 09/24/22 0621  Weight: 69.9 kg 67 kg 68.4 kg    20. Oral ulcer  --2/28 Orajel for oral ulcer discomfort  LOS: 22 days [place]/03/2023, 1:40 PM

## 2022-09-25 NOTE — Progress Notes (Addendum)
Speech Language Pathology Daily Session Note  Patient Details  Name: [name] MRN: [id number] Date of Birth: 1955-10-05  Today's Date: 09/25/2022 SLP Individual Time: 0915-1000 SLP Individual Time Calculation (min): 45 min  Short Term Goals: Week 4: SLP Short Term Goal 1 (Week 4): Pt will use external memory aids to recall important information with sup-to-min A verbal cues. SLP Short Term Goal 2 (Week 4): Pt will demonstrate awareness of errors and repair with min A verbal cues. SLP Short Term Goal 3 (Week 4): Pt will participate in therapeutic task for 15 minute intervals with min A verbal cues. SLP Short Term Goal 4 (Week 4): Patient will complete functional and semi-complex problem solving tasks with sup-to-min A verbal cues  Skilled Therapeutic Interventions:  Patient participated in skilled ST services with focus on cognitive goals.  Patient was distracted throughout session and required consistent redirections to therapeutic tasks.  Patient reported she feels like her memory is on a "loop".  SLP explained that this is normal and will likely continue to improve with strategy use.  SLP facilitated today's session using medication error awareness task.  Patient required several repetitions of directions and cueing for organization and attention.  SLP utilized organization and error awareness strategies re: marking items off, moving L to right and up then down, double checking work.  SLP encouraged that she do important tasks in environments that are low stimulation and limit distractions.  Patient was left in bed with husband in room, all immediate needs within reach . To continue with current POC.  Pain Pain Assessment Pain Scale: 0-10 Pain Score: 0-No pain  Therapy/Group: Individual Therapy  Verdene Lennert 09/25/2022, 12:32 PM

## 2022-09-26 LAB — GLUCOSE, CAPILLARY
Glucose-Capillary: 105 mg/dL — ABNORMAL HIGH (ref 70–99)
Glucose-Capillary: 199 mg/dL — ABNORMAL HIGH (ref 70–99)
Glucose-Capillary: 144 mg/dL — ABNORMAL HIGH (ref 70–99)
Glucose-Capillary: 140 mg/dL — ABNORMAL HIGH (ref 70–99)

## 2022-09-26 MED ORDER — LOSARTAN POTASSIUM 50 MG PO TABS
25.0000 mg | ORAL_TABLET | Freq: Every day | ORAL | Status: DC
  Administered 2022-09-26 – 2022-10-12 (×17): 25 mg via ORAL
  Filled 2022-09-26 (×17): qty 1

## 2022-09-26 NOTE — Progress Notes (Signed)
PROGRESS NOTE   Subjective/Complaints: Pt doing pretty well again today, pain managed fairly well overall, is asking for PRNs when she feels the need.  Slept well. LBM was yesterday. Foley still in place, no issues. Denies any other issues or concerns today.     ROS:  +HA- intermittent,  + back pain + L shoulder pain,  +insomnia-improving, +LE swelling-improved + decreased memory- gradually improving. Denies fevers, malaise, chills, cough, CP, SOB, abd pain, N/V/C, diarrhea, new/worsening paresthesias/weakness, vision changes or any other complaints at this time.    Objective:   No results found. No results for input(s): "WBC", "HGB", "HCT", "PLT" in the last 72 hours.    Recent Labs    09/24/22 0811  NA 130*  K 4.0  CL 99  CO2 23  GLUCOSE 126*  BUN 8  CREATININE 0.32*  CALCIUM 9.1     Intake/Output Summary (Last 24 hours) at 09/26/2022 0741 Last data filed at 09/26/2022 [id number] Gross per 24 hour  Intake 540 ml  Output 3775 ml  Net -3235 ml         Physical Exam: Vital Signs Blood pressure (!) 143/74, pulse 74, temperature 98.2 F (36.8 C), temperature source Oral, resp. rate 18, height '5\' 1"'$  (1.549 m), weight 70.3 kg, SpO2 96 %.  Constitutional:  thin, NAD, lying in bed HENT: [place]/AT,  MMM today  Eyes: EOMI, conjunctiva clear, PERRL Neck:     Comments: Multicolored dry bandage over trach site.  Cardiovascular: RRR, soft murmur heard (VSD), no BLE swelling noted this morning Pulmonary: CTAB no w/r/r, no increased WOB Abdominal: Soft, nontender, nondistended, positive bowel sounds/normal active Genitourinary:    Comments:Sacral wound dressing clean-not reassessed today  foley in place-yellow urine  PRIOR EXAM: Neurological: Follows commands, memory- decreased at times but stable compared to last week,  Alert and oriented x3, no hypertonia noted, CN 2-12 grossly intact Psychiatric:     appropriate, normal  affect  MSK: tenderness to palpation anterior L shoulder, mild pain with passive end ROM in abduction, internal and external rotation   Tenderness R chest wall  Assessment/Plan: 1. Functional deficits which require 3+ hours per day of interdisciplinary therapy in a comprehensive inpatient rehab setting. Physiatrist is providing close team supervision and 24 hour management of active medical problems listed below. Physiatrist and rehab team continue to assess barriers to discharge/monitor patient progress toward functional and medical goals  Care Tool:  Bathing  Bathing activity did not occur: Refused           Bathing assist       Upper Body Dressing/Undressing Upper body dressing   What is the patient wearing?: Hospital gown only    Upper body assist Assist Level: Total Assistance - Patient < 25%    Lower Body Dressing/Undressing Lower body dressing      What is the patient wearing?: Hospital gown only     Lower body assist Assist for lower body dressing: Total Assistance - Patient < 25%     Toileting Toileting Toileting Activity did not occur (Clothing management and hygiene only): N/A (no void or bm) (using catheter and rectal tube)  Toileting assist Assist for toileting: Dependent -  Patient 0%     Transfers Chair/bed transfer  Transfers assist  Chair/bed transfer activity did not occur: Safety/medical concerns  Chair/bed transfer assist level: Maximal Assistance - Patient 25 - 49%     Locomotion Ambulation   Ambulation assist   Ambulation activity did not occur: Safety/medical concerns          Walk 10 feet activity   Assist  Walk 10 feet activity did not occur: Safety/medical concerns        Walk 50 feet activity   Assist Walk 50 feet with 2 turns activity did not occur: Safety/medical concerns         Walk 150 feet activity   Assist Walk 150 feet activity did not occur: Safety/medical concerns         Walk 10 feet on  uneven surface  activity   Assist Walk 10 feet on uneven surfaces activity did not occur: Safety/medical concerns         Wheelchair     Assist Is the patient using a wheelchair?: Yes Type of Wheelchair: Manual Wheelchair activity did not occur: Safety/medical concerns         Wheelchair 50 feet with 2 turns activity    Assist    Wheelchair 50 feet with 2 turns activity did not occur: Safety/medical concerns       Wheelchair 150 feet activity     Assist  Wheelchair 150 feet activity did not occur: Safety/medical concerns       Blood pressure (!) 143/74, pulse 74, temperature 98.2 F (36.8 C), temperature source Oral, resp. rate 18, height '5\' 1"'$  (1.549 m), weight 70.3 kg, SpO2 96 %.  Medical Problem List and Plan: 1. Functional deficits secondary to osteomyelitis, infection of lumbar surgical site complicated by meningitis/ventriculitis.             -pt also with significant deconditioning             -patient may not yet shower             -ELOS/Goals: 10/06/22, Min/Mod A with PT, OT, SLP  -Continue CIR, PT OT SL -3/1 Repeat MRI brain around 09/29/22 prior to stopping abx per Dr Zada Finders note 09/01/22  -Adjust labs to 8am   2.  Antithrombotics: -DVT/anticoagulation:  Pharmaceutical: Lovenox '40mg'$  QD             -antiplatelet therapy: N/a  3. Pain Management: Butrans 67mg/hr patch for pain control w/ Flexeril '5mg'$  TID, Tylenol PRN, Oxycodone 7.'5mg'$  q4h PRN, Gabapentin '100mg'$  TID  -09/04/22 increased flexeril to 7.'5mg'$  TID -09/05/22 schedule Oxycodone '5mg'$  q6h and leave 2.'5mg'$  q4h PRN dosing -2/26 tylenol PRN for HA -2/28 Fioricet ordered PRN headache -09/17/22 will change PRN oxycodone 2.'5mg'$  to percocet 5-'325mg'$ , consider increase gabapentin to help with pain and HA- make changes gradually due to hx of encephalopathy -3/5 plan to wean oxycodone during admission- family reports she becomes easily addicted to these types of medication -3/6 continue current pain  medications for now  -3/7 Voltaren gel for L shoulder pain -3/8 CXR negative, lidocaine for chest wall tenderness started -09/25/22 pain overall managed, no changes made, encouraged use of PRNs when she feels the need  4. Mood/Behavior/Sleep: LCSW to follow for evaluation and support.             --Trazodone '100mg'$  QHS and melatonin '5mg'$  QHS PRN for  insomnia/mood  -09/04/22 placed sign on door to minimize interruptions 11p-5a; changed CBG checks as below to minimize interruptions             -  antipsychotic agents:  Luvox '100mg'$  QHS and Lamictal '200mg'$  QD  -Continue Aricept '10mg'$  QD  -Seen by neuropsych-appreciate assistance  -3/1 increase melatonin to '10mg'$  dose QHS PRN  -3/4 decrease melatonin back to '5mg'$  dose QHS PRN -3/5 family reports she is getting fair amount of sleep, melatonin '10mg'$  caused her to be too sedated and may have caused nausea, continue '5mg'$  dose -09/26/22 happy with melatonin, cont regimen  5. Neuropsych/cognition: This patient is not fully capable of making decisions on her own behalf. 6. Skin/Wound Care: WOC to follow for wound VAC changes --MASD managed by fecal system-- flexiseal clogged 09/04/22 AM but nursing states it's now working; monitor--might be able to d/c soon? -2/21 WOC stopped VAC, start aquacel and dressing -see flexiseal discussion below, #17 -2/27 WOC following, slough pulled away revealing 2cm deep defect -2/29 Spoke with Dr. Zada Finders today about wound depth, continue local wound care- continue to monitor, continue abx per ID,  no plan for procedure at this time -3/4 Seen by [place]- continue aquacel, abd pad/tape, apprecaite assistance -3/7 WOC rec medihoney, fill wound with moistened gauza, distal portion of wound to be backed with silver hydrofiber, appreciate assistance -3/8 Dr. Zada Finders contacted about wound, feels like this is doing well overall, continue current wound care orders, appreciate assistance -09/26/22 verified wound nurse notes for dressing  changes, discussed with family, verified orders are placed in computer correctly per notes from 09/23/22. Cont this plan.   7. Fluids/Electrolytes/Nutrition: Monitor I/O. Continue tube feeds-->likely contributing to diarrhea             -monitor intake closely, continue vitamins -2/19 discontinue core track, Consult dietician -2/27 decrease BMP to every other day -09/25/22 BMP stable yesterday, monitor on q72h labs  8.Sepsis w/ osteomyelitis/meningitis/encephalitis: Continue Cefepime 2g q8h, Vanc '1250mg'$  q12h, and Flagyl '500mg'$  q8h for at least 6 weeks --3/1 per ID continue Vanc+Cefepime until 09/30/22 then switch to suppressive PO Levaquin '750mg'$  QD + Doxycycline '100mg'$  BID until f/up outpatient, Dr. [name] appt 10/11/22 at 2pm; metronidazole has been discontinued as of 09/16/22 (was primarily for PNA, no longer needed)  9. H/o PVT:  Monitor BP/heart rate TID--on coreg 12.'5mg'$  BID>>up to '25mg'$  BID now, Labetalol '10mg'$  q2h PRN  -09/04/22 BP/HR fairly well controlled, cont regimen, monitor  -2/23  BP has been a little above goal, increase coreg dose to '25mg'$  BID  -09/25/22 BP/HR with fair control, continue to monitor -09/26/22 BPs running consistently in the [id number] systolic, will restart Losartan at '25mg'$  QHS (previously on '100mg'$ ), monitor     09/26/2022    6:36 AM 09/25/2022    8:00 PM 09/25/2022    2:26 PM  Vitals with BMI  Weight 155 lbs    BMI [id number]    Systolic [id number]  Diastolic 74 86 87  Pulse 74 87 82      10. Urinary retention: Has failed 4 voiding trails. Has had foley since 12/23             -maintain foley cath, foley mgt per protocol -- Continue Flomax 0.'4mg'$  qPM. Will d/c urecholine.   11. VDRF s/p trach: Tolerating PMSV well -I ordered downsizing to CFS # 4 today--tolerated well -continue ATC w/ pulmonary hygiene -begin day time plugging of trach tomorrow. Decannulate soon. -Trial of capping with respiratory-  can trial 24 hour capping today -2/22 Leave trach uncapped tonight -2/28  plan for decannulation today, has had trach capped for >72hours -2/29 Lurline Idol was  removed - doing well  12. T2DM: Monitor BS ac/hs and use SSI for elevated BS.              --continue tube feeds with supplements between meals due to poor intake             --continue liberalized diet for now. -09/04/22 CBGs looking great, Changed SSI and CBG checks to TID AC + QHS to minimize interruptions overnight, since tube feeds d/c'd -3/8 well controlled, consider decrease glucose checks to daily, would plan to discuss with patient so she is awake before placing order -09/25/22 CBGs well controlled; no change to CBG checks yesterday, will leave them in place over weekend, defer to weekday team -09/26/22 CBGs still looking good  CBG (last 3)  Recent Labs    09/25/22 1654 09/25/22 2002 09/26/22 0628  GLUCAP 109* 176* 105*     13. Hypokalemia: Recurrent likely due to diarrhea/intake.              --will add standing dose BID -->recheck labs on 02/17 and 02/19  -09/04/22 K+ 4.0, continue Klor 39mq BID, monitor on weekly labs -09/05/22 doesn't like Klor packet but no great alternative (Phos-Nak would have to be heavily dosed to get equivalent K+, so only other alternative is IV-- will hold off for now) -2/19 Increase K+ supplement to 460m BID, could consider change to tablet however this is a large tablet and she had trouble swallowing it previously so continue packet for now -2/20, discussed with pharmacy, change to tablet potassium, IV K+ ordered , daily labs -2/23 K+ down to 3.4, increase K+ supplement 204mfrom BID to TID -2/26 stable at 4.2 -09/18/22 stable K+ 4.2 on 09/17/22, wants to change to BID dosing so switched Klor to 72m44mID (still will have 60mE61mtal/day)  -3/8 K+ stable at 4.0, monitor on q72h labs  14. Anemia of critical illness: Hgb 7.5-8.9 range -2/26 stable at HGB 10, monitor on weekly labs, next 09/27/22  15. Hyponatremia/hypochloremia: Improving 125/87-->130/94.  -09/04/22 Na 129,  Cl 95, both stable, continue salt tabs 1g BID -Cortisol testing done during hospitalization (13.8 on 08/30/22), was normal, but daughter and pt want to make sure this is followed up--defer to weekday team regarding ongoing ?Addison's testing -2/21 129, a little decreased, fluid restriction started -2/28 stable at 131 -09/21/22 Na stable at 130, continue to follow with  q48h BMPs- adjust to 8am labs Fluid restrictions liberalized to 1500ml 65m Na stable at 130, monitor on q72h labs  16. Encephalopathy/Delirium: Continues to have intermittent confusion--but much improved compared to 3 days ago per family.  --Continue delirium precautions.  17. Loose stools likely d/t TF and abx.             -reducing TF as above.  -flexiseal clogged 09/04/22 AM but nursing states it's now working; monitor--might be able to d/c soon? -add probiotics and fiber-- fiber changed to NutrisGarcenoD on 09/04/22 since this should cause less clogging of Cortrak -Lomotil 1-2 tabs QID PRN -2/21 improved, rectal tube to help keep wound clean -2/22 continue rectal tube for now -09/11/22 rectal tube dislodged, stools clogging it d/t more formed, d/c for now and see how she does with bedpan, if soiling near wound then needs replacement of flexiseal -09/12/22 stools type 5-6, not super frequent, using diaper not bedpan-- looks like no stool is getting close to wound, but advised trying bedpan. Advised nursing that if stool becomes more frequent or more type 6-7, then flexiseal would need to be replaced.  Monitor closely.  -2/29 LBM today, formed -09/26/22 LBM yesterday, still formed. Monitor   18. Hypothyroidism: continue synthroid 28mg QD  19. Chronic combined CHF, BLE Swelling: recent 2D echo done 08/20/2022 showed apical hypokinesis, LVEF 40-45%, RV function was reduced. Intermittently diuresed during hospitalization, most recently given '40mg'$  IV lasix on 09/03/22 -09/04/22 noted to still have BLE swelling, start '20mg'$  PO lasix and  monitor lytes/BP/swelling -3/5 wt a little up, monitor tend -3/6 asked nutrition to stop milk/carnation- she wasn't drinking but it was counting toward her fluid restriction -3/7 wt stable, fluid restrictions liberalized to 15032m-09/25/22 wt stable, monitor -09/26/22 wt slightly uptrending but no edema noted, monitor Filed Weights   09/23/22 0640 09/24/22 0621 09/26/22 0636  Weight: 67 kg 68.4 kg 70.3 kg    20. Oral ulcer  --2/28 Orajel for oral ulcer discomfort  LOS: 23 days A FACE TO F[place]as/04/2023, 7:41 AM

## 2022-09-27 LAB — CBC
HCT: 31.6 % — ABNORMAL LOW (ref 36.0–46.0)
Hemoglobin: 9.9 g/dL — ABNORMAL LOW (ref 12.0–15.0)
MCH: 30.4 pg (ref 26.0–34.0)
MCHC: 31.3 g/dL (ref 30.0–36.0)
MCV: 96.9 fL (ref 80.0–100.0)
Platelets: 411 10*3/uL — ABNORMAL HIGH (ref 150–400)
RBC: 3.26 MIL/uL — ABNORMAL LOW (ref 3.87–5.11)
RDW: 16.3 % — ABNORMAL HIGH (ref 11.5–15.5)
WBC: 8 10*3/uL (ref 4.0–10.5)
nRBC: 0 % (ref 0.0–0.2)

## 2022-09-27 LAB — BASIC METABOLIC PANEL
Anion gap: 5 (ref 5–15)
BUN: 8 mg/dL (ref 8–23)
CO2: 28 mmol/L (ref 22–32)
Calcium: 9.2 mg/dL (ref 8.9–10.3)
Chloride: 97 mmol/L — ABNORMAL LOW (ref 98–111)
Creatinine, Ser: 0.41 mg/dL — ABNORMAL LOW (ref 0.44–1.00)
GFR, Estimated: >60 mL/min (ref >60)
Glucose, Bld: 130 mg/dL — ABNORMAL HIGH (ref 70–99)
Potassium: 4.3 mmol/L (ref 3.5–5.1)
Sodium: 130 mmol/L — ABNORMAL LOW (ref 135–145)

## 2022-09-27 LAB — GLUCOSE, CAPILLARY
Glucose-Capillary: 140 mg/dL — ABNORMAL HIGH (ref 70–99)
Glucose-Capillary: 121 mg/dL — ABNORMAL HIGH (ref 70–99)
Glucose-Capillary: 144 mg/dL — ABNORMAL HIGH (ref 70–99)

## 2022-09-27 LAB — VANCOMYCIN, TROUGH: Vancomycin Tr: 17 ug/mL (ref 15–20)

## 2022-09-27 MED ORDER — METFORMIN HCL 500 MG PO TABS
500.0000 mg | ORAL_TABLET | Freq: Every day | ORAL | Status: DC
  Administered 2022-09-28 – 2022-09-29 (×2): 500 mg via ORAL
  Filled 2022-09-27 (×2): qty 1

## 2022-09-27 NOTE — Progress Notes (Signed)
Physical Therapy Session Note  Patient Details  Name: [name] MRN: [id number] Date of Birth: 07-11-1956  Today's Date: 09/27/2022 PT Individual Time: [id number] PT Individual Time Calculation (min): 69 min   Short Term Goals: Week 3:  PT Short Term Goal 1 (Week 3): Pt will complete bed mobility with modA consistently. PT Short Term Goal 2 (Week 3): Pt will completes bed to chair transfers iwth modA consistently. PT Short Term Goal 3 (Week 3): Pt will ambulate x10' with modA +2 and LRAD.  Skilled Therapeutic Interventions/Progress Updates:     Pt received supine in bed and agrees to therapy. Reports pain in back but number not provided. PT provides repositioning and rest breaks to manage pain. Pt verbalizes need for bowel movement. ModA/maxA for supine to sit with cues for sequencing, positioning, and body mechanics. Pt given additional time seated at EOB to acclimate to upright positioning, and is able to remain in short sitting without external support, with cues for posture. Pt then performs sit to stand with modA to facilitate hip extension. Pt performs stand step to [facility] and PT provides totalA to pull down pants. Pt is continent of bowel and RN and rehab tech assist with pericare. Following, pt performs sit to stand to RW wit hminA/modA and cues for hand placement and anterior weight shifting. Pt stands with RW as BSC is removed and tilt in space WC brought to allow pt seated rest break. WC transport to gym. Session focuses on transfers and gait training. Pt performs multiple bouts of sit to stand, with minA/modA and consistent cueing for optimal hand placement and sequencing. PT provides assistance at hips to facilitate forward weight shifting and hip extension. Upon standing, pt performs multiple bouts of ambulation, initially requiring modA to maintain hip extension and progress forward, but improving and eventually completing with minA (and +2 for close WC follow). Pt ambulates bouts  of 3', 4', 4', and 10', with extended seated rest breaks in between bouts. PT provides cues for posture, lateral weight shifting, and progression of contralateral lower extremity. Pt encouraged to remain in tilt in space WC following session and pt is agreeable. Left seated with son present.   Therapy Documentation Precautions:  Precautions Precautions: Fall, Back Precaution Comments: trach, rectal tube, foley Required Braces or Orthoses: Other Brace Other Brace: prevlon boots Restrictions Weight Bearing Restrictions: No Other Position/Activity Restrictions: external rotation R hip at baseline at all times, RLE shorter than LLE, pt ambulated household distances PTA, w/c for community   Therapy/Group: Individual Therapy  Breck Coons, PT, DPT 09/27/2022, 4:55 PM

## 2022-09-27 NOTE — Progress Notes (Signed)
Speech Language Pathology Daily Session Note  Patient Details  Name: ANIELLA PLATTE MRN: [id number] Date of Birth: [birth year]  Today's Date: 09/27/2022 SLP Individual Time: [id number] SLP Individual Time Calculation (min): 61 min  Short Term Goals: Week 4: SLP Short Term Goal 1 (Week 4): Pt will use external memory aids to recall important information with sup-to-min A verbal cues. SLP Short Term Goal 2 (Week 4): Pt will demonstrate awareness of errors and repair with min A verbal cues. SLP Short Term Goal 3 (Week 4): Pt will participate in therapeutic task for 15 minute intervals with min A verbal cues. SLP Short Term Goal 4 (Week 4): Patient will complete functional and semi-complex problem solving tasks with sup-to-min A verbal cues  Skilled Therapeutic Interventions:   Pt seen for skilled SLP intervention to address functional cognitive goals with focus on review and implementation of memory and attention strategies. Pt reviewed how she has been using her memory journal. Use appears inconsistent and somewhat disorganized, though pt is please with her ability to reference previous information that is important to her. Family has updated her calendar to visually demonstrate potentially d/c date, which pt independently referenced. We reviewed other compensatory memory strategies including making lists, visual reminders around home, and use of phone calendar/notes app. Pt completed visual memory task of recall of detailed items in pictured scene with variable accuracy of 50-80% depending on the image. She was able to improve accuracy to 75-100% given min cues. Pt left supine in bed with call bell in reach and bed alarm activated. Son at bedside. Continue SLP PoC.   Pain Pain Assessment Pain Scale: 0-10 Pain Score: 0-No pain  Therapy/Group: Individual Therapy  Wyn Forster 09/27/2022, 11:04 AM

## 2022-09-27 NOTE — Consult Note (Signed)
Pray Nurse wound follow up Wound type: lumbar wound, nonintact segment at proximal and distal end.  Hypergranulation at distal end. Slough to proximal end, improving with medihoney.  Measurement: 1cm x 0.4 cm x 1.5 cm slough has improved, 25% wound bed yellow slough with 75%pink and moist.  No hardware visible.  Distal suture line 0.3 cm x 0.1 cm x + 0.1 cm hypergranulation present.   Wound bed:see above Drainage (amount, consistency, odor) minimal serosanguinous  no odor Periwound: intact suture line in medial aspect is intact.  Tape is causing irritation and will minimize use.  Medial suture suture line site is kept open to air. Exposure to dermatherapy will improve skin microclimate.   Dressing procedure/placement/frequency: Clarification:  Medihoney and gauze to proximal end (open wound) and aquacel to distal end (hypergranulation) Cover with 2x2 and minimal tape.  Change daily.   Will see weekly.  Estrellita Ludwig MSN, RN, FNP-BC CWON Wound, Ostomy, Continence Nurse [facility] [phone] Pager [phone]

## 2022-09-27 NOTE — Progress Notes (Signed)
PROGRESS NOTE   Subjective/Complaints:  Son at bedside, has list of questions including Na+, need for fluid restriction, timing of voiding trial, CBG monitoring frequency, resumption of metformin  ROS:  neg CP, SOB, N/V/D.  + dry mucous membranes  Objective:   No results found. Recent Labs    09/27/22 0528  WBC 8.0  HGB 9.9*  HCT 31.6*  PLT 411*      Recent Labs    09/27/22 0528  NA 130*  K 4.3  CL 97*  CO2 28  GLUCOSE 130*  BUN 8  CREATININE 0.41*  CALCIUM 9.2     Intake/Output Summary (Last 24 hours) at 09/27/2022 1239 Last data filed at 09/27/2022 0934 Gross per 24 hour  Intake --  Output 3200 ml  Net -3200 ml         Physical Exam: Vital Signs Blood pressure 139/83, pulse 82, temperature 98.2 F (36.8 C), resp. rate 16, height '5\' 1"'$  (1.549 m), weight 70.3 kg, SpO2 98 %.   General: No acute distress Mood and affect are appropriate Heart: Regular rate and rhythm no rubs murmurs or extra sounds Lungs: Clear to auscultation, breathing unlabored, no rales or wheezes Abdomen: Positive bowel sounds, soft nontender to palpation, nondistended Extremities: No clubbing, cyanosis, or edema Skin: No evidence of breakdown, no evidence of rash  PRIOR EXAM: Neurological: Follows commands, memory- decreased at times but stable compared to last week,  Alert and oriented x3, no hypertonia noted, CN 2-12 grossly intact Psychiatric:     appropriate, normal affect  MSK: tenderness to palpation anterior L shoulder, mild pain with passive end ROM in abduction, internal and external rotation   Tenderness R chest wall  Assessment/Plan: 1. Functional deficits which require 3+ hours per day of interdisciplinary therapy in a comprehensive inpatient rehab setting. Physiatrist is providing close team supervision and 24 hour management of active medical problems listed below. Physiatrist and rehab team continue to  assess barriers to discharge/monitor patient progress toward functional and medical goals  Care Tool:  Bathing  Bathing activity did not occur: Refused           Bathing assist       Upper Body Dressing/Undressing Upper body dressing   What is the patient wearing?: Hospital gown only    Upper body assist Assist Level: Total Assistance - Patient < 25%    Lower Body Dressing/Undressing Lower body dressing      What is the patient wearing?: Hospital gown only     Lower body assist Assist for lower body dressing: Total Assistance - Patient < 25%     Toileting Toileting Toileting Activity did not occur Landscape architect and hygiene only): N/A (no void or bm) (using catheter and rectal tube)  Toileting assist Assist for toileting: Dependent - Patient 0%     Transfers Chair/bed transfer  Transfers assist  Chair/bed transfer activity did not occur: Safety/medical concerns  Chair/bed transfer assist level: Maximal Assistance - Patient 25 - 49%     Locomotion Ambulation   Ambulation assist   Ambulation activity did not occur: Safety/medical concerns          Walk 10 feet activity  Assist  Walk 10 feet activity did not occur: Safety/medical concerns        Walk 50 feet activity   Assist Walk 50 feet with 2 turns activity did not occur: Safety/medical concerns         Walk 150 feet activity   Assist Walk 150 feet activity did not occur: Safety/medical concerns         Walk 10 feet on uneven surface  activity   Assist Walk 10 feet on uneven surfaces activity did not occur: Safety/medical concerns         Wheelchair     Assist Is the patient using a wheelchair?: Yes Type of Wheelchair: Manual Wheelchair activity did not occur: Safety/medical concerns         Wheelchair 50 feet with 2 turns activity    Assist    Wheelchair 50 feet with 2 turns activity did not occur: Safety/medical concerns       Wheelchair  150 feet activity     Assist  Wheelchair 150 feet activity did not occur: Safety/medical concerns       Blood pressure 139/83, pulse 82, temperature 98.2 F (36.8 C), resp. rate 16, height '5\' 1"'$  (1.549 m), weight 70.3 kg, SpO2 98 %.  Medical Problem List and Plan: 1. Functional deficits secondary to osteomyelitis, infection of lumbar surgical site complicated by meningitis/ventriculitis.             -pt also with significant deconditioning             -patient may not yet shower             -ELOS/Goals: 10/06/22, Min/Mod A with PT, OT, SLP  -Continue CIR, PT OT SL -3/1 Repeat MRI brain around 09/29/22 prior to stopping abx per Dr Zada Finders note 09/01/22  -Adjust labs to 8am   2.  Antithrombotics: -DVT/anticoagulation:  Pharmaceutical: Lovenox '40mg'$  QD             -antiplatelet therapy: N/a  3. Pain Management: Butrans 25mg/hr patch for pain control w/ Flexeril '5mg'$  TID, Tylenol PRN, Oxycodone 7.'5mg'$  q4h PRN, Gabapentin '100mg'$  TID  -09/04/22 increased flexeril to 7.'5mg'$  TID -09/05/22 schedule Oxycodone '5mg'$  q6h and leave 2.'5mg'$  q4h PRN dosing -2/26 tylenol PRN for HA -2/28 Fioricet ordered PRN headache -09/17/22 will change PRN oxycodone 2.'5mg'$  to percocet 5-'325mg'$ , consider increase gabapentin to help with pain and HA- make changes gradually due to hx of encephalopathy -3/5 plan to wean oxycodone during admission- family reports she becomes easily addicted to these types of medication -3/6 continue current pain medications for now  -3/7 Voltaren gel for L shoulder pain -3/8 CXR negative, lidocaine for chest wall tenderness started -09/25/22 pain overall managed, no changes made, encouraged use of PRNs when she feels the need Taking ~'35mg'$  of OxyIR per day the last 2 days  4. Mood/Behavior/Sleep: LCSW to follow for evaluation and support.             --Trazodone '100mg'$  QHS and melatonin '5mg'$  QHS PRN for  insomnia/mood  -09/04/22 placed sign on door to minimize interruptions 11p-5a; changed CBG  checks as below to minimize interruptions             -antipsychotic agents:  Luvox '100mg'$  QHS and Lamictal '200mg'$  QD  -Continue Aricept '10mg'$  QD  -Seen by neuropsych-appreciate assistance  -3/1 increase melatonin to '10mg'$  dose QHS PRN  -3/4 decrease melatonin back to '5mg'$  dose QHS PRN -3/5 family reports she is getting fair amount of sleep, melatonin '10mg'$   caused her to be too sedated and may have caused nausea, continue '5mg'$  dose -09/26/22 happy with melatonin, cont regimen  5. Neuropsych/cognition: This patient is not fully capable of making decisions on her own behalf. 6. Skin/Wound Care: WOC to follow for wound VAC changes --MASD managed by fecal system-- flexiseal clogged 09/04/22 AM but nursing states it's now working; monitor--might be able to d/c soon? -2/21 WOC stopped VAC, start aquacel and dressing -see flexiseal discussion below, #17 -2/27 WOC following, slough pulled away revealing 2cm deep defect -2/29 Spoke with Dr. Zada Finders today about wound depth, continue local wound care- continue to monitor, continue abx per ID,  no plan for procedure at this time -3/4 Seen by [facility]- continue aquacel, abd pad/tape, apprecaite assistance -3/7 WOC rec medihoney, fill wound with moistened gauza, distal portion of wound to be backed with silver hydrofiber, appreciate assistance -3/8 Dr. Zada Finders contacted about wound, feels like this is doing well overall, continue current wound care orders, appreciate assistance -09/26/22 verified wound nurse notes for dressing changes, discussed with family, verified orders are placed in computer correctly per notes from 09/23/22. Cont this plan.   7. Fluids/Electrolytes/Nutrition: Monitor I/O. Continue tube feeds-->likely contributing to diarrhea             -monitor intake closely, continue vitamins -2/19 discontinue core track, Consult dietician -2/27 decrease BMP to every other day -09/25/22 BMP stable yesterday, monitor on q72h labs  8.Sepsis w/  osteomyelitis/meningitis/encephalitis: Continue Cefepime 2g q8h, Vanc '1250mg'$  q12h, and Flagyl '500mg'$  q8h for at least 6 weeks --3/1 per ID continue Vanc+Cefepime until 09/30/22 then switch to suppressive PO Levaquin '750mg'$  QD + Doxycycline '100mg'$  BID until f/up outpatient, Dr. [name] appt 10/11/22 at 2pm; metronidazole has been discontinued as of 09/16/22 (was primarily for PNA, no longer needed)  9. H/o PVT:  Monitor BP/heart rate TID--on coreg 12.'5mg'$  BID>>up to '25mg'$  BID now, Labetalol '10mg'$  q2h PRN  -09/04/22 BP/HR fairly well controlled, cont regimen, monitor  -2/23  BP has been a little above goal, increase coreg dose to '25mg'$  BID  -09/25/22 BP/HR with fair control, continue to monitor -09/26/22 BPs running consistently in the [id number] systolic, will restart Losartan at '25mg'$  QHS (previously on '100mg'$ ), monitor     09/27/2022    5:29 AM 09/27/2022    5:00 AM 09/26/2022    7:17 PM  Vitals with BMI  Weight  155 lbs   BMI  [id number]   Systolic [id number]  [id number]  Diastolic 83  82  Pulse 82  84      10. Urinary retention: Has failed 4 voiding trails. Has had foley since 12/23             -maintain foley cath, foley mgt per protocol -- Continue Flomax 0.'4mg'$  qPM. Will d/c urecholine.   11. VDRF s/p trach: Tolerating PMSV well -I ordered downsizing to CFS # 4 today--tolerated well -continue ATC w/ pulmonary hygiene -begin day time plugging of trach tomorrow. Decannulate soon. -Trial of capping with respiratory-  can trial 24 hour capping today -2/22 Leave trach uncapped tonight -2/28 plan for decannulation today, has had trach capped for >72hours -2/29 Lurline Idol was  removed - doing well   12. T2DM: Monitor BS ac/hs and use SSI for elevated BS.              --continue tube feeds with supplements between meals due to poor intake             --continue liberalized diet for now. -  09/04/22 CBGs looking great, Changed SSI and CBG checks to TID AC + QHS to minimize interruptions overnight, since tube feeds d/c'd -3/8  well controlled, consider decrease glucose checks to daily, would plan to discuss with patient so she is awake before placing order -09/25/22 CBGs well controlled; no change to CBG checks yesterday, will leave them in place over weekend, defer to weekday team -09/26/22 CBGs still looking good  CBG (last 3)  Recent Labs    09/26/22 2135 09/27/22 0642 09/27/22 1205  GLUCAP 140* 140* 121*    Controlled 3/11, receiving 3-6 U Novalog with SSI will resume metformin '500mg'$  with breakfast, did not use insulin at home 13. Hypokalemia: Recurrent likely due to diarrhea/intake.              --will add standing dose BID -->recheck labs on 02/17 and 02/19  -09/04/22 K+ 4.0, continue Klor 58mq BID, monitor on weekly labs -09/05/22 doesn't like Klor packet but no great alternative (Phos-Nak would have to be heavily dosed to get equivalent K+, so only other alternative is IV-- will hold off for now) -2/19 Increase K+ supplement to 421m BID, could consider change to tablet however this is a large tablet and she had trouble swallowing it previously so continue packet for now -2/20, discussed with pharmacy, change to tablet potassium, IV K+ ordered , daily labs -2/23 K+ down to 3.4, increase K+ supplement 2029mfrom BID to TID -2/26 stable at 4.2 -09/18/22 stable K+ 4.2 on 09/17/22, wants to change to BID dosing so switched Klor to 65m57mID (still will have 60mE46mtal/day)  -3/8 K+ stable at 4.0, monitor on q72h labs  14. Anemia of critical illness: Hgb 7.5-8.9 range -stable at 9.9    Latest Ref Rng & Units 09/27/2022    5:28 AM 09/20/2022    3:44 AM 09/13/2022    3:40 AM  CBC  WBC 4.0 - 10.5 K/uL 8.0  7.3  7.0   Hemoglobin 12.0 - 15.0 g/dL 9.9  10.1  10.0   Hematocrit 36.0 - 46.0 % 31.6  31.7  30.8   Platelets 150 - 400 K/uL 411  419  406      15. Hyponatremia/hypochloremia: I -09/04/22 Na 129, Cl 95, both stable, continue salt tabs 1g BID -Cortisol testing done during hospitalization (13.8 on 08/30/22),  was normal, but daughter and pt want to make sure this is followed up--2/21 129, a little decreased, fluid restriction started -2/28 stable at 131 -09/21/22 Na stable at 130, continue to follow with  q48h BMPs- adjust to 8am labs Fluid restrictions liberalized to 1500ml 48m Na stable at 130, monitor on weekly labs- cont FR  16. Encephalopathy/Delirium: Continues to have intermittent confusion--but much improved compared to 3 days ago per family.  --Continue delirium precautions.  17. Loose stools likely d/t TF and abx.             -reducing TF as above.  -flexiseal clogged 09/04/22 AM but nursing states it's now working; monitor--might be able to d/c soon? -add probiotics and fiber-- fiber changed to NutrisValmyD on 09/04/22 since this should cause less clogging of Cortrak -Lomotil 1-2 tabs QID PRN -2/21 improved, rectal tube to help keep wound clean -2/22 continue rectal tube for now -09/11/22 rectal tube dislodged, stools clogging it d/t more formed, d/c for now and see how she does with bedpan, if soiling near wound then needs replacement of flexiseal -09/12/22 stools type 5-6, not super frequent, using diaper not bedpan-- looks  like no stool is getting close to wound, but advised trying bedpan. Advised nursing that if stool becomes more frequent or more type 6-7, then flexiseal would need to be replaced. Monitor closely.  -2/29 LBM today, formed -09/26/22 LBM yesterday, still formed. Monitor   18. Hypothyroidism: continue synthroid 13mg QD  19. Chronic combined CHF, BLE Swelling: recent 2D echo done 08/20/2022 showed apical hypokinesis, LVEF 40-45%, RV function was reduced. Intermittently diuresed during hospitalization, most recently given '40mg'$  IV lasix on 09/03/22 -09/04/22 noted to still have BLE swelling, start '20mg'$  PO lasix and monitor lytes/BP/swelling -3/5 wt a little up, monitor tend -3/6 asked nutrition to stop milk/carnation- she wasn't drinking but it was counting toward her  fluid restriction -3/7 wt stable, fluid restrictions liberalized to 15087m-09/25/22 wt stable, monitor -09/26/22 wt slightly uptrending but no edema noted, monitor Filed Weights   09/24/22 0621 09/26/22 0636 09/27/22 0500  Weight: 68.4 kg 70.3 kg 70.3 kg    20. Oral ulcer  --2/28 Orajel for oral ulcer discomfort 21.  Dry mucous membranes, may be related to anticholinergic meds cyclobenzaprine and trazodone, pain and sleep are doing better so reluctant to stop at this time LOS: 24 days [place] 09/27/2022, 12:39 PM

## 2022-09-27 NOTE — Progress Notes (Signed)
Occupational Therapy Session Note  Patient Details  Name: [name] MRN: [id number] Date of Birth: 07-Apr-1956  Today's Date: 09/27/2022 OT Individual Time: 1345-1444 OT Individual Time Calculation (min): 59 min    Short Term Goals: Week 4:  OT Short Term Goal 1 (Week 4): Pt will complete stand pivot consistently with mod A OT Short Term Goal 2 (Week 4): Pt will complete sit > stand with mod A consistently OT Short Term Goal 3 (Week 4): Pt will stand statically with BUE support and no more than min A  Skilled Therapeutic Interventions/Progress Updates:   Son Emilio Math bedside and pt just coming off of BSC via [name] with nursing upon OT arrival with Level 1 student for observation. Pt transferred into TIS. Pt open to all activity with son requesting pt work on UE strengthening this session as pt had standing and some amb earlier and all self care routine handled in AM. OT transported pt to far ortho gym as pt in [place]. Discussed potential to have a regular manual chair as an option these next few days in addition to TIS for working on UE strength and self propulsion and pt would consider but did not want OT to retrieve an additional chair set up this session although it would have been ideal with student and son present while pt used [employer]. Pt performed 4 sets of 2 minute intervals forward and reverse on L1.5. Pt heeded OT as hx of L shoulder replacement thus resistance maintained low. Brief rest between each set. Pt then was able to complete 4 sets of B bicep curls and tricep presses 15 reps each side with rest between each. Back in room, pt requested back to bed and completed modified squat pivot transfer with mod-max a to r side TIS to EOB. Significant improvement in lateral scotting performance from this clincian's last session with pt. Pt was able to perform sit to supine with LE mngt assist only and use of bed rails and bed features. Pain reported 6/10 lower back. Left pt resting with all  needs, son bed side, nurse call button and bed exit engaged.   Therapy Documentation Precautions:  Precautions Precautions: Fall, Back Precaution Comments: trach, rectal tube, foley Required Braces or Orthoses: Other Brace Other Brace: prevlon boots Restrictions Weight Bearing Restrictions: No Other Position/Activity Restrictions: external rotation R hip at baseline at all times, RLE shorter than LLE, pt ambulated household distances PTA, w/c for community    Therapy/Group: Individual Therapy  Barnabas Lister 09/27/2022, 7:55 AM

## 2022-09-28 DIAGNOSIS — G009 Bacterial meningitis, unspecified: Secondary | ICD-10-CM

## 2022-09-28 DIAGNOSIS — M545 Low back pain, unspecified: Secondary | ICD-10-CM

## 2022-09-28 LAB — GLUCOSE, CAPILLARY
Glucose-Capillary: 158 mg/dL — ABNORMAL HIGH (ref 70–99)
Glucose-Capillary: 176 mg/dL — ABNORMAL HIGH (ref 70–99)
Glucose-Capillary: 127 mg/dL — ABNORMAL HIGH (ref 70–99)

## 2022-09-28 MED ORDER — CYCLOBENZAPRINE HCL 10 MG PO TABS
10.0000 mg | ORAL_TABLET | Freq: Three times a day (TID) | ORAL | Status: DC
  Administered 2022-09-28 – 2022-10-13 (×45): 10 mg via ORAL
  Filled 2022-09-28 (×45): qty 1

## 2022-09-28 NOTE — Progress Notes (Signed)
PROGRESS NOTE   Subjective/Complaints:  Pt up in bed. Family at bedside. Woke up "on the wrong side of bed". Having more diffuse back pain this morning. Just received something for pain. Asked about her memory and if it would come back/how much it would improve. Daughter asked about her f/u MRI which was planned this week as well as WBC's which were increased relative to last cbc.  ROS: Patient denies fever, rash, sore throat, blurred vision, dizziness, nausea, vomiting, diarrhea, cough, shortness of breath or chest pain,  headache, or mood change.   Objective:   No results found. Recent Labs    09/27/22 0528  WBC 8.0  HGB 9.9*  HCT 31.6*  PLT 411*      Recent Labs    09/27/22 0528  NA 130*  K 4.3  CL 97*  CO2 28  GLUCOSE 130*  BUN 8  CREATININE 0.41*  CALCIUM 9.2    Intake/Output Summary (Last 24 hours) at 09/28/2022 0926 Last data filed at 09/28/2022 [id number] Gross per 24 hour  Intake 240 ml  Output 4200 ml  Net -3960 ml        Physical Exam: Vital Signs Blood pressure (!) 142/75, pulse 80, temperature 98.1 F (36.7 C), temperature source Oral, resp. rate 18, height '5\' 1"'$  (1.549 m), weight 70.3 kg, SpO2 98 %.   Constitutional: No distress . Vital signs reviewed. HEENT: NCAT, EOMI, oral membranes moist Neck: supple Cardiovascular: RRR with murmur. No JVD    Respiratory/Chest: CTA Bilaterally without wheezes or rales. Normal effort    GI/Abdomen: BS +, non-tender, non-distended Ext: no clubbing, cyanosis, or edema Psych: pleasant and cooperative Skin: did not examine back wound today  Neurological: Follows commands, some STM deficits but substantially improved from when I saw her at admit Alert and oriented x3, no hypertonia noted, CN 2-12 grossly intact. Motor 4/4 UE and 3-4/5 LE prox to distal.  MSK: LB tender. Tenderness R chest wall    Assessment/Plan: 1. Functional deficits which require 3+  hours per day of interdisciplinary therapy in a comprehensive inpatient rehab setting. Physiatrist is providing close team supervision and 24 hour management of active medical problems listed below. Physiatrist and rehab team continue to assess barriers to discharge/monitor patient progress toward functional and medical goals  Care Tool:  Bathing  Bathing activity did not occur: Refused           Bathing assist       Upper Body Dressing/Undressing Upper body dressing   What is the patient wearing?: Hospital gown only    Upper body assist Assist Level: Total Assistance - Patient < 25%    Lower Body Dressing/Undressing Lower body dressing      What is the patient wearing?: Hospital gown only     Lower body assist Assist for lower body dressing: Total Assistance - Patient < 25%     Toileting Toileting Toileting Activity did not occur Landscape architect and hygiene only): N/A (no void or bm) (using catheter and rectal tube)  Toileting assist Assist for toileting: Dependent - Patient 0%     Transfers Chair/bed transfer  Transfers assist  Chair/bed transfer activity did not occur: Safety/medical  concerns  Chair/bed transfer assist level: Maximal Assistance - Patient 25 - 49%     Locomotion Ambulation   Ambulation assist   Ambulation activity did not occur: Safety/medical concerns          Walk 10 feet activity   Assist  Walk 10 feet activity did not occur: Safety/medical concerns        Walk 50 feet activity   Assist Walk 50 feet with 2 turns activity did not occur: Safety/medical concerns         Walk 150 feet activity   Assist Walk 150 feet activity did not occur: Safety/medical concerns         Walk 10 feet on uneven surface  activity   Assist Walk 10 feet on uneven surfaces activity did not occur: Safety/medical concerns         Wheelchair     Assist Is the patient using a wheelchair?: Yes Type of Wheelchair:  Manual Wheelchair activity did not occur: Safety/medical concerns         Wheelchair 50 feet with 2 turns activity    Assist    Wheelchair 50 feet with 2 turns activity did not occur: Safety/medical concerns       Wheelchair 150 feet activity     Assist  Wheelchair 150 feet activity did not occur: Safety/medical concerns       Blood pressure (!) 142/75, pulse 80, temperature 98.1 F (36.7 C), temperature source Oral, resp. rate 18, height '5\' 1"'$  (1.549 m), weight 70.3 kg, SpO2 98 %.  Medical Problem List and Plan: 1. Functional deficits secondary to osteomyelitis, infection of lumbar surgical site complicated by meningitis/ventriculitis.             -pt also with significant deconditioning             -patient may not yet shower             -ELOS/Goals: 10/06/22, Min/Mod A with PT, OT, SLP  -Continue CIR therapies including PT, OT, and SLP  -3/12 will order repeat MRI brain w,w/o for 09/29/22 for reassessment prior to stopping abx per Dr Zada Finders note 09/01/22  -Adjusted labs to 8am   2.  Antithrombotics: -DVT/anticoagulation:  Pharmaceutical: Lovenox '40mg'$  QD             -antiplatelet therapy: N/a  3. Pain Management: Butrans 57mg/hr patch for pain control w/ Flexeril '5mg'$  TID, Tylenol PRN, Oxycodone 7.'5mg'$  q4h PRN, Gabapentin '100mg'$  TID  -09/04/22 increased flexeril to 7.'5mg'$  TID -09/05/22 schedule Oxycodone '5mg'$  q6h and leave 2.'5mg'$  q4h PRN dosing -2/26 tylenol PRN for HA -2/28 Fioricet ordered PRN headache -09/17/22 will change PRN oxycodone 2.'5mg'$  to percocet 5-'325mg'$ , consider increase gabapentin to help with pain and HA- make changes gradually due to hx of encephalopathy -3/5 plan to wean oxycodone during admission- family reports she becomes easily addicted to these types of medication -3/6 continue current pain medications for now  -3/7 Voltaren gel for L shoulder pain -3/8 CXR negative, lidocaine for chest wall tenderness started -09/25/22 pain overall managed, no  changes made, encouraged use of PRNs when she feels the need 3/11- adjusted flexeril to '10mg'$ , no change in narcotics  -suspect spasms this morning  4. Mood/Behavior/Sleep: LCSW to follow for evaluation and support.             --Trazodone '100mg'$  QHS and melatonin '5mg'$  QHS PRN for  insomnia/mood  -09/04/22 placed sign on door to minimize interruptions 11p-5a; changed CBG checks as below to minimize  interruptions             -antipsychotic agents:  Luvox '100mg'$  QHS and Lamictal '200mg'$  QD  -Continue Aricept '10mg'$  QD  -Seen by neuropsych-appreciate assistance  -3/1 increase melatonin to '10mg'$  dose QHS PRN  -3/4 decrease melatonin back to '5mg'$  dose QHS PRN -3/11 melatonin '5mg'$  working reasonably well  5. Neuropsych/cognition: This patient is not fully capable of making decisions on her own behalf.  -reviewed cognitive recovery with patient today. She has made nice gains already. May take until this summer to plateau. May have had some mild memory deficits PTA as well.  6. Skin/Wound Care: WOC to follow for wound VAC changes --MASD managed by fecal system-- flexiseal clogged 09/04/22 AM but nursing states it's now working; monitor--might be able to d/c soon? -2/21 WOC stopped VAC, start aquacel and dressing -see flexiseal discussion below, #17 -2/27 WOC following, slough pulled away revealing 2cm deep defect -2/29 Spoke with Dr. Zada Finders today about wound depth, continue local wound care- continue to monitor, continue abx per ID,  no plan for procedure at this time -3/4 Seen by [facility]- continue aquacel, abd pad/tape, apprecaite assistance -3/7 WOC rec medihoney, fill wound with moistened gauza, distal portion of wound to be backed with silver hydrofiber, appreciate assistance -3/8 Dr. Zada Finders contacted about wound, feels like this is doing well overall, continue current wound care orders, appreciate assistance -3/12 continue with current plan  7. Fluids/Electrolytes/Nutrition: Monitor I/O.  Continue tube feeds-->likely contributing to diarrhea             -monitor intake closely, continue vitamins -2/19 discontinue core track, Consult dietician -2/27 decrease BMP to every other day -09/25/22 BMP stable yesterday, monitor on q72h labs  8.Sepsis w/ osteomyelitis/meningitis/encephalitis: Continue Cefepime 2g q8h, Vanc '1250mg'$  q12h, and Flagyl '500mg'$  q8h for at least 6 weeks --3/1 per ID continue Vanc+Cefepime until 09/30/22 then switch to suppressive PO Levaquin '750mg'$  QD + Doxycycline '100mg'$  BID until f/up outpatient, Dr. [name] appt 10/11/22 at 2pm; metronidazole has been discontinued as of 09/16/22 (was primarily for PNA, no longer needed) 3/12 repeat MRI as above  -unconcerned about wbc of 8. Well within normal limits and no other s/s of new/increased infection 9. H/o PVT:  Monitor BP/heart rate TID--on coreg 12.'5mg'$  BID>>up to '25mg'$  BID now, Labetalol '10mg'$  q2h PRN  -09/04/22 BP/HR fairly well controlled, cont regimen, monitor  -2/23  BP has been a little above goal, increase coreg dose to '25mg'$  BID  -09/25/22 BP/HR with fair control, continue to monitor -09/26/22 BPs running consistently in the [id number] systolic, will restart Losartan at '25mg'$  QHS (previously on '100mg'$ ), monitor     09/28/2022    6:42 AM 09/27/2022    7:46 PM 09/27/2022    2:46 PM  Vitals with BMI  Systolic [id number]  Diastolic 75 89 83  Pulse 80 64 84      10. Urinary retention: Has failed 4 voiding trails. Has had foley since 12/23             -maintain foley cath, foley mgt per protocol -- Continue Flomax 0.'4mg'$  qPM. Will d/c urecholine.   11. VDRF s/p trach: Tolerating PMSV well -I ordered downsizing to CFS # 4 today--tolerated well -continue ATC w/ pulmonary hygiene -begin day time plugging of trach tomorrow. Decannulate soon. -Trial of capping with respiratory-  can trial 24 hour capping today -2/22 Leave trach uncapped tonight -2/28 plan for decannulation today, has had trach capped for >72hours -2/29 Lurline Idol  was  removed - doing well   12. T2DM: Monitor BS ac/hs and use SSI for elevated BS.               --continue liberalized diet for now.  CBG (last 3)  Recent Labs    09/27/22 1205 09/27/22 1644 09/28/22 0915  GLUCAP 121* 144* 158*   Controlled 3/12, receiving 3-6 U Novalog with SSI   - resume metformin '500mg'$  with breakfast effective today -consider changing to CM diet 13. Hypokalemia: Recurrent likely due to diarrhea/intake.              --09/18/22 stable K+ 4.2 on 09/17/22, wants to change to BID dosing so switched Klor to 69mq BID (still will have 647m total/day)  -3/12 4.3 yesterday  14. Anemia of critical illness: Hgb 7.5-8.9 range -stable at 9.9    Latest Ref Rng & Units 09/27/2022    5:28 AM 09/20/2022    3:44 AM 09/13/2022    3:40 AM  CBC  WBC 4.0 - 10.5 K/uL 8.0  7.3  7.0   Hemoglobin 12.0 - 15.0 g/dL 9.9  10.1  10.0   Hematocrit 36.0 - 46.0 % 31.6  31.7  30.8   Platelets 150 - 400 K/uL 411  419  406      15. Hyponatremia/hypochloremia: I -09/04/22 Na 129, Cl 95, both stable, continue salt tabs 1g BID -Cortisol testing done during hospitalization (13.8 on 08/30/22), was normal, but daughter and pt want to make sure this is followed up--2/21 129, a little decreased, fluid restriction started -2/28 stable at 131 -09/21/22 Na stable at 130, continue to follow with  q48h BMPs- adjust to 8am labs Fluid restrictions liberalized to 150028m3/11 Na stable at 130, monitor on weekly labs - cont 1500cc FR for now  16. Encephalopathy/Delirium: improved substantially  17. Loose stools likely d/t TF and abx.            -recent rectal tube, since dc'ed  -generally improved -09/26/22 LBM mushy but formed  18. Hypothyroidism: continue synthroid 41m18mD  19. Chronic combined CHF, BLE Swelling: recent 2D echo done 08/20/2022 showed apical hypokinesis, LVEF 40-45%, RV function was reduced. Intermittently diuresed during hospitalization, most recently given '40mg'$  IV lasix on 09/03/22 -09/04/22  noted to still have BLE swelling, start '20mg'$  PO lasix and monitor lytes/BP/swelling -3/5 wt a little up, monitor tend -3/6 asked nutrition to stop milk/carnation- she wasn't drinking but it was counting toward her fluid restriction -3/7 wt stable, fluid restrictions liberalized to 1500ml29m9/24 wt stable, monitor -09/28/22 wt generally stable. No peripheral edema Filed Weights   09/24/22 0621 09/26/22 0636 09/27/22 0500  Weight: 68.4 kg 70.3 kg 70.3 kg    20. Oral ulcer  --2/28 Orajel for oral ulcer discomfort 21.  Dry mucous membranes, may be related to anticholinergic meds cyclobenzaprine and trazodone, pain and sleep are doing better so reluctant to stop at this time   LOS: 25 days A FACE TO FACE [place]/2024, 9:26 AM

## 2022-09-28 NOTE — Progress Notes (Signed)
Speech Language Pathology Weekly Progress and Session Note  Patient Details  Name: [name] MRN: [id number] Date of Birth: 1955/09/02  Beginning of progress report period: September 21, 2022 End of progress report period: September 28, 2022  Today's Date: 09/28/2022 SLP Individual Time: 1415-1500 SLP Individual Time Calculation (min): 45 min  Short Term Goals: Week 4: SLP Short Term Goal 1 (Week 4): Pt will use external memory aids to recall important information with sup-to-min A verbal cues. SLP Short Term Goal 1 - Progress (Week 4): Met SLP Short Term Goal 2 (Week 4): Pt will demonstrate awareness of errors and repair with min A verbal cues. SLP Short Term Goal 2 - Progress (Week 4): Met SLP Short Term Goal 3 (Week 4): Pt will participate in therapeutic task for 15 minute intervals with min A verbal cues. SLP Short Term Goal 3 - Progress (Week 4): Met SLP Short Term Goal 4 (Week 4): Patient will complete functional and semi-complex problem solving tasks with sup-to-min A verbal cues SLP Short Term Goal 4 - Progress (Week 4): Not met    New Short Term Goals: Week 5: SLP Short Term Goal 1 (Week 5): STG's = LTG's due to ELOS  Weekly Progress Updates: Patient demonstrates functional gains and has met 3 of 4 STGs this reporting period due to improved cognitive linguistic function and insight into deficits. Patient is currently completing cognitive tasks with overall sup to min A verbal/visual cues in regards to sustained attention, functional recall, problem solving, and emergent awareness. Pt can continue to be self limiting at times, however, this appears to be improving during speech therapy sessions. Pt enjoys utilizing memory notebook and is interested in learning more stress management strategies. Patient and family education is ongoing. Patient would benefit from continued skilled SLP intervention to maximize cognitive functioning and overall functional independence prior to  discharge. Pt would benefit from f/u SLP intervention at discharge in home health or outpatient setting.   Intensity: Minumum of 1-2 x/day, 30 to 90 minutes Frequency: 3 to 5 out of 7 days Duration/Length of Stay: 3/20 Treatment/Interventions: Cognitive remediation/compensation;Internal/external aids;Cueing hierarchy;Patient/family education;Therapeutic Activities;Functional tasks;Therapeutic Exercise   Daily Session  Skilled Therapeutic Interventions:     Pt seen this date for skilled ST intervention targeting cognitive goals outlined in care plan. Pt received awake/alert and lying semi-reclined in bed. Daughter at bedside. Agreeable to intervention at bedside. Participatory when topic interested pt.   SLP facilitated today's session by providing Sup A verbal cues for therapeutic tasks targeting working memory and focused attention - attended to task for ~5 minutes before pt wished to terminate. Utilized Social worker to recall previous activities with Sup to [employer] A verbal cues. Recalled morning activities completed in therapy with sup to min A  - therapist wrote information in memory notebook to aid in organization. This date, pt wished to discuss coping with chronic pain - provided education re: general strategies for pain management to include distraction, repositioning, and use of mindfulness techniques. Pt expressed interest in practicing these interventions in upcoming sessions. Pt limited by pain this date.   Pt left in room and in bed with all safety measures activated, call bell within reach, and all immediate needs met. Continue per current ST POC.  Pain Pain reported; LPN provided pain medication.  Therapy/Group: Individual Therapy  Cleopha Indelicato A Mari Battaglia 09/28/2022, 3:10 PM

## 2022-09-28 NOTE — Progress Notes (Signed)
Occupational Therapy Session Note  Patient Details  Name: [name] MRN: [id number] Date of Birth: [birth year]  Today's Date: 09/28/2022 OT Individual Time: 1115-1200 OT Individual Time Calculation (min): 45 min    Short Term Goals: Week 4:  OT Short Term Goal 1 (Week 4): Pt will complete stand pivot consistently with mod A OT Short Term Goal 2 (Week 4): Pt will complete sit > stand with mod A consistently OT Short Term Goal 3 (Week 4): Pt will stand statically with BUE support and no more than min A  Skilled Therapeutic Interventions/Progress Updates:    Pt received sitting in the w/c with 4/10 c/o pain, agreeable to OT session. Assisted LPN Lovena Le with wound care, facilitating lean forward for LPN to access wound. Pt was taken via w/c to the therapy gym for time management. Focus of session on standing tolerance and balance statically. She required mod A to stand from the TIS w/c x6 repetitions. Facilitation provided at the glutes and trunk. She was able to stand statically with BUE support on stable table with CGA for up to 3 min! Great improvement in activity tolerance overall. She engaged in functional reaching for 2 trials of 2 min without rest before fatiguing during the 3rd trial. She returned to her TIS w/c and to her room. She was left sitting up with all needs met, son present.    Therapy Documentation Precautions:  Precautions Precautions: Fall, Back Precaution Comments: trach, rectal tube, foley Required Braces or Orthoses: Other Brace Other Brace: prevlon boots Restrictions Weight Bearing Restrictions: No Other Position/Activity Restrictions: external rotation R hip at baseline at all times, RLE shorter than LLE, pt ambulated household distances PTA, w/c for community  Therapy/Group: Individual Therapy  Curtis Sites 09/28/2022, 6:13 AM

## 2022-09-28 NOTE — Progress Notes (Signed)
Physical Therapy Session Note  Patient Details  Name: [name] MRN: [id number] Date of Birth: Apr 13, 1956  Today's Date: 09/28/2022 PT Individual Time: 0920-1019 PT Individual Time Calculation (min): 59 min   Short Term Goals: Week 3:  PT Short Term Goal 1 (Week 3): Pt will complete bed mobility with modA consistently. PT Short Term Goal 2 (Week 3): Pt will completes bed to chair transfers iwth modA consistently. PT Short Term Goal 3 (Week 3): Pt will ambulate x10' with modA +2 and LRAD.  Skilled Therapeutic Interventions/Progress Updates:    Pt received supine in bed with her son present and nurse exiting upon therapist arrival. Pt agreeable to therapy session and in good spirits this morning.   Supine>sitting R EOB, HOB partially elevated, via logroll technique with heavy mod assist for B LE management over to EOB and then pt able to push trunk upright with increased time and CGA.  Sitting EOB, using B UE support for trunk control balance, while therapist threaded on LB clothing dependently. Pt able to scoots hips to EOB using sequential R/L scooting technique without assistance - small scoots.  Sit>stand partially elevated EOB>RW with heavy mod/light max assist for lifting to stand from hips - pt does better when pushing up with both hands from seat. +2 assist pulled pants up dependently due to pt having strong posterior lean initially upon standing, but improved upon subsequent stands throughout session.  L stand pivot transfer EOB>TIS w/c using RW with light mod assist of 1 for balance due to minor posterior lean and assisting with weight shifting. Pt does sit quickly into seat with decreased safety awareness, not turning and stepping back fully.    Transported to/from gym in w/c for time management and energy conservation.  Gait training 13f + 540fusing RW with min/mod assist for balance and +2 w/c follow for when pt fatigues. Pt demonstrating the following gait deviations  with therapist providing the described cuing and facilitation for improvement:  - due to leg length discrepancy (L longer than R) - pt stays on R stance for a long time and then has to vault over L LE via extending L knee and pushing up through B UEs to lift up and step R foot forward  - step-to pattern leading with L LE  - pt does well with her balance throughout and no signs of knee buckling  Pt reporting significant fatigue and increasing pain after performing gait training.  Seated L LE long arc quads 4lb weight 2x15 reps with external target to ensure full terminal knee extension.   At end of session, pt left seated in TIS w/c with her son present to assume care of her.  Therapy Documentation Precautions:  Precautions Precautions: Fall, Back Precaution Comments: trach, rectal tube, foley Required Braces or Orthoses: Other Brace Other Brace: prevlon boots Restrictions Weight Bearing Restrictions: No Other Position/Activity Restrictions: external rotation R hip at baseline at all times, RLE shorter than LLE, pt ambulated household distances PTA, w/c for community   Pain:  Pt reports back pain and bilateral hip pain throughout session - pt premedicated - provided distraction, emotional support, and change of environment with increased activity for pain management.   Therapy/Group: Individual Therapy  CaTawana Scale PT, DPT, NCS, CSRS 09/28/2022, 7:57 AM

## 2022-09-28 NOTE — Progress Notes (Signed)
Patient ID: [name], female   DOB: 1955-10-02, 67 y.o.   MRN: [id number]  Son and daughter requesting family meeting to meet with team and have questions answered. Also would like extension of discharge. Aware following MD is off and will discuss in team conference tomorrow and get back with son or daughter tomorrow.

## 2022-09-28 NOTE — Progress Notes (Signed)
Physical Therapy Session Note  Patient Details  Name: [name] MRN: [id number] Date of Birth: 10/28/1955  Today's Date: 09/28/2022 PT Individual Time: [id number] PT Individual Time Calculation (min): 57 min   Short Term Goals: Week 3:  PT Short Term Goal 1 (Week 3): Pt will complete bed mobility with modA consistently. PT Short Term Goal 2 (Week 3): Pt will completes bed to chair transfers iwth modA consistently. PT Short Term Goal 3 (Week 3): Pt will ambulate x10' with modA +2 and LRAD.  Skilled Therapeutic Interventions/Progress Updates:     Pt received supine in bed and agrees to therapy. Reports pain in back, ribs, and R knee. Pt has ice pack on R knee after "twisting" it while performing transfer from [facility] to bed prior to session. PT provides repositioning and rest breaks to manage pain. Pt performs supine to sit with modA/maxA with cues for sequencing, logrolling, and body mechanics. Pt performs stand step transfer from bed to [facility] with modA and facilitation of hip extension, lateral weight shifting, and positioning. WC transport to gym. Pt requires extended seated rest break prior to mobility. Pt performs multiple bouts of ambulation with RW and minA/modA +1 and +2 WC follow for safety. PT cues for hip and trunk extension to improve posture and balance, as well as safe RW management. Pt ambulates bouts of 7', 8', and 3', with extended seated rest breaks.  Stand pivot back to bed with PT providing manual facilitation at hips and trunk. MaxA for sit to supine. Pt left supine with all needs within reach.   Therapy Documentation Precautions:  Precautions Precautions: Fall, Back Precaution Comments: trach, rectal tube, foley Required Braces or Orthoses: Other Brace Other Brace: prevlon boots Restrictions Weight Bearing Restrictions: No Other Position/Activity Restrictions: external rotation R hip at baseline at all times, RLE shorter than LLE, pt ambulated household distances PTA,  w/c for community   Therapy/Group: Individual Therapy  Breck Coons, PT, DPT 09/28/2022, 5:02 PM

## 2022-09-28 NOTE — Progress Notes (Signed)
Pharmacy Antibiotic Note  [name] is a33 y.o. 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 Last Vancomycin trough 17 mcg/mL (3/11).  Renal function remains stable.  Afebrile, WBC WNL.   Plan: Vancomycin 1000 IV Q12h Cefepime 2g IV Q8h  Trend WBC, fever, renal function F/u cultures, clinical progress, levels as indicated De-escalate when able  Height: '5\' 1"'$  (154.9 cm) Weight: 70.3 kg (154 lb 15.7 oz) IBW/kg (Calculated) : 47.8  Temp (24hrs), Avg:98.1 F (36.7 C), Min:98 F (36.7 C), Max:98.1 F (36.7 C)  Recent Labs  Lab 09/24/22 0811 09/27/22 0528  WBC  --  8.0  CREATININE 0.32* 0.41*  VANCOTROUGH  --  17    Estimated Creatinine Clearance: 61.2 mL/min (A) (by C-G formula based on SCr of 0.41 mg/dL (L)).    No Known Allergies  Antibiotics this admission: Vanc 1/31>> Cefepime 2/1 >> Flagyl 2/1 >>   Levels: 3/04 VT = 17 mcg/mL 3/11 VT = 17 mcg/mL   Thank you for allowing pharmacy to be a part of this patient's care.  Mignon Pine 09/28/2022 10:08 AM

## 2022-09-29 ENCOUNTER — Inpatient Hospital Stay (HOSPITAL_COMMUNITY): Payer: Medicare Other

## 2022-09-29 DIAGNOSIS — F4323 Adjustment disorder with mixed anxiety and depressed mood: Secondary | ICD-10-CM

## 2022-09-29 LAB — GLUCOSE, CAPILLARY: Glucose-Capillary: 126 mg/dL — ABNORMAL HIGH (ref 70–99)

## 2022-09-29 MED ORDER — METFORMIN HCL 500 MG PO TABS
500.0000 mg | ORAL_TABLET | Freq: Two times a day (BID) | ORAL | Status: DC
  Administered 2022-09-29 – 2022-10-13 (×28): 500 mg via ORAL
  Filled 2022-09-29 (×28): qty 1

## 2022-09-29 MED ORDER — GADOBUTROL 1 MMOL/ML IV SOLN
6.0000 mL | Freq: Once | INTRAVENOUS | Status: AC | PRN
  Administered 2022-09-29: 6 mL via INTRAVENOUS

## 2022-09-29 NOTE — Progress Notes (Signed)
Patients son at desk at shift change. Explained to son that we would be with him shortly. Son states that her dressing change was not done properly. Patients dressing change was completed at 10 am this morning with Therapist Robina Ade at bedside along with son. Son watched dressing change and never questioned or stated it was wrong. On coming nurse aware and will observe dressing with assessment this evening.

## 2022-09-29 NOTE — Progress Notes (Signed)
Patient continues to request PRN Oxycodone 5/325 mg for break through pain medication. Patient states her pain is a 10/10. States pain is in her back legs, and head. Also provided cool towel, dimmed the lights and turned off tv for relaxation. PICC line patent Vancomycin running as ordered without difficulties. Foley cath care completed, CHG bath completed. Clear yellow urine noted. Son is at bedside, no questions or concerns from patient or son at this time. Call light within reach

## 2022-09-29 NOTE — Progress Notes (Signed)
Occupational Therapy Session Note  Patient Details  Name: [name] MRN: [id number] Date of Birth: 29-Sep-1955  Today's Date: 09/29/2022 OT Individual Time: 1330-1410 OT Individual Time Calculation (min): 40 min    Short Term Goals: Week 4:  OT Short Term Goal 1 (Week 4): Pt will complete stand pivot consistently with mod A OT Short Term Goal 2 (Week 4): Pt will complete sit > stand with mod A consistently OT Short Term Goal 3 (Week 4): Pt will stand statically with BUE support and no more than min A  Skilled Therapeutic Interventions/Progress Updates:    Pt sitting in the liberty chair complaining of back pain and discomfort In general in the chair and requesting to move to the TIS w/c. CSW entered to inform of d/c date- pt VERY upset re this extension, education attempted and encouragement provided re progress and reasoning behind this. Pt non receptive and very rude to therapist. Pilar Plate education provided on this not being tolerated. Pt able to continue with rest of session with only minimal outbursts. She completed a sit > stand and stand pivot transfer with the RW with mod A overall to the TIS. Pt was taken via w/c to the therapy gym. She completed BUE strengthening circuit with a 5lb dowel. Improvement in overhead AROM and strength overall, able to reach to 110 degrees of shoulder flexion. Min facilitation required to reach from chest and achieve full elbow extension. Tricep extension and flexion with a 5lb dumbbell with facilitation provided to stabilize elbow for improved muscle activation. She returned to her room and transferred back to bed with mod A. She was left supine with all needs met. Daughter present.   Therapy Documentation Precautions:  Precautions Precautions: Fall, Back Precaution Comments: trach, rectal tube, foley Required Braces or Orthoses: Other Brace Other Brace: prevlon boots Restrictions Weight Bearing Restrictions: No Other Position/Activity  Restrictions: external rotation R hip at baseline at all times, RLE shorter than LLE, pt ambulated household distances PTA, w/c for community  Therapy/Group: Individual Therapy  Curtis Sites 09/29/2022, 6:48 AM

## 2022-09-29 NOTE — Progress Notes (Signed)
Occupational Therapy Session Note  Patient Details  Name: [name] MRN: [id number] Date of Birth: [birth year]  Today's Date: 09/29/2022 OT Individual Time: [id number] Individual Time Calculation (min): 41 min    Short Term Goals: Week 4:  OT Short Term Goal 1 (Week 4): Pt will complete stand pivot consistently with mod A OT Short Term Goal 2 (Week 4): Pt will complete sit > stand with mod A consistently OT Short Term Goal 3 (Week 4): Pt will stand statically with BUE support and no more than min A  Skilled Therapeutic Interventions/Progress Updates:  Pt greeted supine in bed reporting having a "bad day" but agreeable to OT intervention. Pt completed supine>sit with only MIN A needing light assist to elevate trunk into sitting. Pt completed stand pivot to liberty w/c with RW to L side with MODA. Pt continues to present with difficulty shifting hips anteriorly while standing but able to pivot with only MODA, pt did sit prematurely into chair needing assist to guide hips safely into chair.   Total A transport to day room where remainder of session focused standing tolerance and dynamic standing balance. Pt completed 2 sit>stand trials with pt able to stand for 1 min each but required MAX A to power into stand and cues for hand placement. Pt able to stand to stack cones during one trial and able to use BUEs during second trial to un stack cones challenging pts ability to stand with no UE support.   Ended session with pt seated in w/c with daughter present getting ready to take pt outside.    Therapy Documentation Precautions:  Precautions Precautions: Fall, Back Precaution Comments: trach, rectal tube, foley Required Braces or Orthoses: Other Brace Other Brace: prevlon boots Restrictions Weight Bearing Restrictions: No Other Position/Activity Restrictions: external rotation R hip at baseline at all times, RLE shorter than LLE, pt ambulated household distances PTA, w/c for  community   Pain: unrated generalized pain reported, rest breaks provided as needed    Therapy/Group: Individual Therapy  Corinne Ports [facility] 09/29/2022, 12:12 PM

## 2022-09-29 NOTE — Progress Notes (Signed)
PROGRESS NOTE   Subjective/Complaints:  Was working with NT/nurse on getting up to [facility] when I came in. Asked me when she was going to get some decent sleep. Said that she was very depressed and didn't want to keep on going. Stated that she is bipolar and if she could talk to a psychologist. She saw Dr. Sima Matas a couple weeks ago. Very irritable and labile this morning  ROS: Limited due to cognitive/behavioral   Objective:   No results found. Recent Labs    09/27/22 0528  WBC 8.0  HGB 9.9*  HCT 31.6*  PLT 411*      Recent Labs    09/27/22 0528  NA 130*  K 4.3  CL 97*  CO2 28  GLUCOSE 130*  BUN 8  CREATININE 0.41*  CALCIUM 9.2    Intake/Output Summary (Last 24 hours) at 09/29/2022 0840 Last data filed at 09/29/2022 [id number] Gross per 24 hour  Intake --  Output 3625 ml  Net -3625 ml        Physical Exam: Vital Signs Blood pressure (!) 147/78, pulse 85, temperature 98.2 F (36.8 C), temperature source Oral, resp. rate 18, height '5\' 1"'$  (1.549 m), weight 67.1 kg, SpO2 99 %.   Constitutional: No distress . Vital signs reviewed. HEENT: NCAT, EOMI, oral membranes moist Neck: supple Cardiovascular: RRR without murmur. No JVD    Respiratory/Chest: CTA Bilaterally without wheezes or rales. Normal effort    GI/Abdomen: BS +, non-tender, non-distended Ext: no clubbing, cyanosis, or edema Psych: irritable, difficult to redirect.  Skin: back wound dressed  Neurological: Follows commands, some STM deficits but substantially improved from when I saw her at admit Alert and oriented x3, no hypertonia noted, CN 2-12 grossly intact. Motor 4/4 UE and 3-4/5 LE prox to distal.--no gross changes today  MSK: LB tender. Tenderness R chest wall    Assessment/Plan: 1. Functional deficits which require 3+ hours per day of interdisciplinary therapy in a comprehensive inpatient rehab setting. Physiatrist is providing close  team supervision and 24 hour management of active medical problems listed below. Physiatrist and rehab team continue to assess barriers to discharge/monitor patient progress toward functional and medical goals  Care Tool:  Bathing  Bathing activity did not occur: Refused           Bathing assist       Upper Body Dressing/Undressing Upper body dressing   What is the patient wearing?: Hospital gown only    Upper body assist Assist Level: Total Assistance - Patient < 25%    Lower Body Dressing/Undressing Lower body dressing      What is the patient wearing?: Hospital gown only     Lower body assist Assist for lower body dressing: Total Assistance - Patient < 25%     Toileting Toileting Toileting Activity did not occur Landscape architect and hygiene only): N/A (no void or bm) (using catheter and rectal tube)  Toileting assist Assist for toileting: Dependent - Patient 0%     Transfers Chair/bed transfer  Transfers assist  Chair/bed transfer activity did not occur: Safety/medical concerns  Chair/bed transfer assist level: Maximal Assistance - Patient 25 - 49%     Locomotion  Ambulation   Ambulation assist   Ambulation activity did not occur: Safety/medical concerns          Walk 10 feet activity   Assist  Walk 10 feet activity did not occur: Safety/medical concerns        Walk 50 feet activity   Assist Walk 50 feet with 2 turns activity did not occur: Safety/medical concerns         Walk 150 feet activity   Assist Walk 150 feet activity did not occur: Safety/medical concerns         Walk 10 feet on uneven surface  activity   Assist Walk 10 feet on uneven surfaces activity did not occur: Safety/medical concerns         Wheelchair     Assist Is the patient using a wheelchair?: Yes Type of Wheelchair: Manual Wheelchair activity did not occur: Safety/medical concerns         Wheelchair 50 feet with 2 turns  activity    Assist    Wheelchair 50 feet with 2 turns activity did not occur: Safety/medical concerns       Wheelchair 150 feet activity     Assist  Wheelchair 150 feet activity did not occur: Safety/medical concerns       Blood pressure (!) 147/78, pulse 85, temperature 98.2 F (36.8 C), temperature source Oral, resp. rate 18, height '5\' 1"'$  (1.549 m), weight 67.1 kg, SpO2 99 %.  Medical Problem List and Plan: 1. Functional deficits secondary to osteomyelitis, infection of lumbar surgical site complicated by meningitis/ventriculitis.             -pt also with significant deconditioning             -patient may not yet shower             -ELOS/Goals: 10/06/22, Min/Mod A with PT, OT, SLP  --Continue CIR therapies including PT, OT, and SLP. Interdisciplinary team conference today to discuss goals, barriers to discharge, and dc planning.    -Adjusted labs to 8am   2.  Antithrombotics: -DVT/anticoagulation:  Pharmaceutical: Lovenox '40mg'$  QD             -antiplatelet therapy: N/a  3. Pain Management: Butrans 32mg/hr patch for pain control w/ Flexeril '5mg'$  TID, Tylenol PRN, Oxycodone 7.'5mg'$  q4h PRN, Gabapentin '100mg'$  TID  -09/04/22 increased flexeril to 7.'5mg'$  TID -09/05/22 schedule Oxycodone '5mg'$  q6h and leave 2.'5mg'$  q4h PRN dosing -2/26 tylenol PRN for HA -2/28 Fioricet ordered PRN headache -09/17/22 will change PRN oxycodone 2.'5mg'$  to percocet 5-'325mg'$ , consider increase gabapentin to help with pain and HA- make changes gradually due to hx of encephalopathy -3/5 plan to wean oxycodone during admission- family reports she becomes easily addicted to these types of medication -3/6 continue current pain medications for now  -3/7 Voltaren gel for L shoulder pain -3/8 CXR negative, lidocaine for chest wall tenderness started -09/25/22 pain overall managed, no changes made, encouraged use of PRNs when she feels the need 3/12- adjusted flexeril to '10mg'$ , no change in narcotics   4.  Mood/Behavior/Sleep: LCSW to follow for evaluation and support.             --Trazodone '100mg'$  QHS and melatonin '5mg'$  QHS PRN for  insomnia/mood  -09/04/22 placed sign on door to minimize interruptions 11p-5a; changed CBG checks as below to minimize interruptions             -antipsychotic agents:  Luvox '100mg'$  QHS and Lamictal '200mg'$  QD  -Continue Aricept '10mg'$  QD  -  Seen by neuropsych-appreciate assistance  -3/1 increase melatonin to '10mg'$  dose QHS PRN  -3/4 decrease melatonin back to '5mg'$  dose QHS PRN -3/11 melatonin '5mg'$  working reasonably well  -3/13 pt extremely labile this morning. Will see if this passes this morning. I will reach out to neuropsych about a follow up visit.   -no changes to psych regimen for now   -team will try to provide emotional support as possible 5. Neuropsych/cognition: This patient is not fully capable of making decisions on her own behalf.  -reviewed cognitive recovery with patient today. She has made nice gains already. May take until this summer to plateau. May have had some mild memory deficits PTA as well.  6. Skin/Wound Care: WOC to follow for wound VAC changes --MASD managed by fecal system-- flexiseal clogged 09/04/22 AM but nursing states it's now working; monitor--might be able to d/c soon? -2/21 WOC stopped VAC, start aquacel and dressing -see flexiseal discussion below, #17 -2/27 WOC following, slough pulled away revealing 2cm deep defect -2/29 Spoke with Dr. Zada Finders today about wound depth, continue local wound care- continue to monitor, continue abx per ID,  no plan for procedure at this time -3/4 Seen by [place]- continue aquacel, abd pad/tape, apprecaite assistance -3/7 WOC rec medihoney, fill wound with moistened gauza, distal portion of wound to be backed with silver hydrofiber, appreciate assistance -3/8 Dr. Zada Finders contacted about wound, feels like this is doing well overall, continue current wound care orders, appreciate assistance -3/13  continue with current plan  7. Fluids/Electrolytes/Nutrition: Monitor I/O. Continue tube feeds-->likely contributing to diarrhea             -monitor intake closely, continue vitamins -2/19 discontinue core track, Consult dietician -2/27 decrease BMP to every other day -09/25/22 BMP stable yesterday, monitor on q72h labs  8.Sepsis w/ osteomyelitis/meningitis/encephalitis: Continue Cefepime 2g q8h, Vanc '1250mg'$  q12h, and Flagyl '500mg'$  q8h for at least 6 weeks --3/1 per ID continue Vanc+Cefepime until 09/30/22 then switch to suppressive PO Levaquin '750mg'$  QD + Doxycycline '100mg'$  BID until f/up outpatient, Dr. [name] appt 10/11/22 at 2pm; metronidazole has been discontinued as of 09/16/22 (was primarily for PNA, no longer needed) -3/13 repeat MRI of brain pending this morning.    9. H/o PVT:  Monitor BP/heart rate TID--on coreg 12.'5mg'$  BID>>up to '25mg'$  BID now, Labetalol '10mg'$  q2h PRN  -09/04/22 BP/HR fairly well controlled, cont regimen, monitor  -2/23  BP has been a little above goal, increase coreg dose to '25mg'$  BID  -09/25/22 BP/HR with fair control, continue to monitor -3/13 bp/hr controlled     09/29/2022    6:34 AM 09/29/2022    6:14 AM 09/28/2022    8:56 PM  Vitals with BMI  Weight 147 lbs 15 oz    BMI [id number]    Systolic  [id number]  Diastolic  78 68  Pulse  85 95      10. Urinary retention: Has failed 4 voiding trails. Has had foley since 12/23             -maintain foley cath, foley mgt per protocol -- Continue Flomax 0.'4mg'$  qPM. Will d/c urecholine.   11. VDRF s/p trach: Tolerating PMSV well -I ordered downsizing to CFS # 4 today--tolerated well -continue ATC w/ pulmonary hygiene -begin day time plugging of trach tomorrow. Decannulate soon. -Trial of capping with respiratory-  can trial 24 hour capping today -2/22 Leave trach uncapped tonight -2/28 plan for decannulation today, has had trach capped for >72hours -2/29 Lurline Idol  was  removed - doing well   12. T2DM: Monitor BS ac/hs and use  SSI for elevated BS.               --continue liberalized diet for now.  CBG (last 3)  Recent Labs    09/28/22 1205 09/28/22 1655 09/29/22 0621  GLUCAP 176* 127* 126*    -3/13 fair control, receiving 3-6 U Novalog with SSI   - resumed metformin '500mg'$  with breakfast effective  -consider changing to CM diet -monitor patterns today 13. Hypokalemia: Recurrent likely due to diarrhea/intake.              --09/18/22 stable K+ 4.2 on 09/17/22, wants to change to BID dosing so switched Klor to 42mq BID (still will have 631m total/day)  -3/12 4.3 yesterday  14. Anemia of critical illness: Hgb 7.5-8.9 range -stable at 9.9    Latest Ref Rng & Units 09/27/2022    5:28 AM 09/20/2022    3:44 AM 09/13/2022    3:40 AM  CBC  WBC 4.0 - 10.5 K/uL 8.0  7.3  7.0   Hemoglobin 12.0 - 15.0 g/dL 9.9  10.1  10.0   Hematocrit 36.0 - 46.0 % 31.6  31.7  30.8   Platelets 150 - 400 K/uL 411  419  406      15. Hyponatremia/hypochloremia: I -09/04/22 Na 129, Cl 95, both stable, continue salt tabs 1g BID -Cortisol testing done during hospitalization (13.8 on 08/30/22), was normal, but daughter and pt want to make sure this is followed up--2/21 129, a little decreased, fluid restriction started -2/28 stable at 131 -09/21/22 Na stable at 130, continue to follow with  q48h BMPs- adjust to 8am labs Fluid restrictions liberalized to 150049m3/11 Na stable at 130, monitor on weekly labs - cont 1500cc FR for now  16. Encephalopathy/Delirium: improved substantially  17. Loose stools likely d/t TF and abx.            -recent rectal tube, since dc'ed  -generally improved -09/28/22 LBM --mushy but formed  18. Hypothyroidism: continue synthroid 35m79mD  19. Chronic combined CHF, BLE Swelling: recent 2D echo done 08/20/2022 showed apical hypokinesis, LVEF 40-45%, RV function was reduced. Intermittently diuresed during hospitalization, most recently given '40mg'$  IV lasix on 09/03/22 -09/04/22 noted to still have BLE swelling,  start '20mg'$  PO lasix and monitor lytes/BP/swelling -3/5 wt a little up, monitor tend -3/6 asked nutrition to stop milk/carnation- she wasn't drinking but it was counting toward her fluid restriction -3/7 wt stable, fluid restrictions liberalized to 1500ml35m9/24 wt stable, monitor -09/29/22 wt remains generally stable. No peripheral edema Filed Weights   09/26/22 0636 09/27/22 0500 09/29/22 0634  Weight: 70.3 kg 70.3 kg 67.1 kg    20. Oral ulcer  --2/28 Orajel for oral ulcer discomfort 21.  Dry mucous membranes, may be related to anticholinergic meds cyclobenzaprine and trazodone, pain and sleep are doing better so reluctant to stop at this time   LOS: 26 days A FACE TO FACE [place]/2024, 8:40 AM

## 2022-09-29 NOTE — Progress Notes (Signed)
Physical Therapy Session Note  Patient Details  Name: [name] MRN: [id number] Date of Birth: 1956-06-07  Today's Date: 09/29/2022 PT Individual Time: 0800-0810 PT Individual Time Calculation (min): 10 min  and Today's Date: 09/29/2022 PT Missed Time: 15 Minutes Missed Time Reason: CT/MRI  Short Term Goals: Week 3:  PT Short Term Goal 1 (Week 3): Pt will complete bed mobility with modA consistently. PT Short Term Goal 2 (Week 3): Pt will completes bed to chair transfers iwth modA consistently. PT Short Term Goal 3 (Week 3): Pt will ambulate x10' with modA +2 and LRAD.   Skilled Therapeutic Interventions/Progress Updates:      Pt in bed with both legs hanging off - requesting assistance in getting comfortable in bed. Assist for lifting BLE to return to supine in bed. Transport arriving to take patient for MRI. NT also present to assist with repositioning in bed - +2 assist for time management for boosting up in bed and making patient comfortable in bed. Rolling in bed with supervision using bed rails to readjust chuck pad. Pt missed 50 minutes of therapy session for MRI.   Therapy Documentation Precautions:  Precautions Precautions: Fall, Back Precaution Comments: trach, rectal tube, foley Required Braces or Orthoses: Other Brace Other Brace: prevlon boots Restrictions Weight Bearing Restrictions: No Other Position/Activity Restrictions: external rotation R hip at baseline at all times, RLE shorter than LLE, pt ambulated household distances PTA, w/c for community General:    Therapy/Group: Individual Therapy  [name] PT 09/29/2022, 7:44 AM

## 2022-09-29 NOTE — Progress Notes (Addendum)
Patient ID: [name], female   DOB: 09/04/1955, 67 y.o.   MRN: [id number]  Met with pt and daughter to update them both regarding team conference and possible extension to 3/27. Pt is not happy about this but daughter feels with medical issues and progress it is needed. Pt is tired of being in the hospital and feels can go home on original date. Daughter to speak with Mom and will let pt think about it. Will go back in and later after therapies.  2:30 PM met with pt and daughter again to discuss team conference progress and plan. Pt will talk with the MD in the am regarding PICC line and need antibiotics. She is not happy about staying longer and will discuss with family. Have given home health list and private duty list again and will see in am to discuss choices.

## 2022-09-29 NOTE — Patient Care Conference (Signed)
Inpatient RehabilitationTeam Conference and Plan of Care Update Date: 09/29/2022   Time: 12:02 PM  Patient Name: [name]      Medical Record Number: [id number]  Date of Birth: Apr 02, 1956 Sex: Female         Room/Bed: 4W14C/4W14C-01 Payor Info: Payor: [place] / Plan: BCBS MEDICARE / Product Type: *No Product type* /    Admit Date/Time:  09/03/2022  6:18 PM  Primary Diagnosis:  Infection of lumbar spine [facility])  Hospital Problems: Principal Problem:   Infection of lumbar spine ([place]) Active Problems:   VENTRICULAR SEPTAL DEFECT, CONGENITAL   Anxiety   Diabetes mellitus, type 2 (HCC)   Acid reflux   Mild cognitive impairment   Mood disorder (HCC)   Wound dehiscence   Encephalopathy   Malnutrition of moderate degree   Bacterial meningitis   Hardware complicating wound infection ([place])   Acute blood loss anemia   Chronic combined systolic and diastolic CHF (congestive heart failure) (HCC)   Chronic midline thoracic back pain   Primary insomnia   Chronic wound   Hyperglycemia    Expected Discharge Date: Expected Discharge Date: 10/13/22 (D/C date extended)  Team Members Present: Physician leading conference: Dr. Leeroy Cha Nurse Present: Dorien Chihuahua, RN PT Present: Becky Sax, PT;Rosita Dechalus, PTA OT Present: Laverle Hobby, OT SLP Present: Weston Anna, SLP PPS Coordinator present : Gunnar Fusi, SLP     Current Status/Progress Goal Weekly Team Focus  Bowel/Bladder   Foley, continent of bowel   Remain continent   Continue foley care and assist with toileting prn    Swallow/Nutrition/ Hydration   GOAL MET - no longer addressing   N/A  N/A    ADL's   (S) UB ADLs, mod A stand pivots, mod A LB ADLs, still max A toileting tasks   min-mod A overall   ADLs, transfers, standing balance and tolerance, d/c planning    Mobility   modA bed mobility, modA sit to stand and stand step tranfers, modA gait x10' with RW and +2 WC  follow   min/modA overall  bed mobility, transfers, balance, gait, endurance    Communication   N/A   N/A   N/A    Safety/Cognition/ Behavioral Observations  Sup to Min A   Sup A   awareness, attention, memory, problem-solving, and education    Pain   c/o back pain 8/10, prn and scheduled pain meds   pain score <3   Assess pain qshift    Skin   Dailychanges on back incision   promote healing and prevent infection  Assess qshift and prn      Discharge Planning:  Pt is making progress in therapies and more invested in participating family would like an extension for another week to continue to work on walking and overall function. MRI today to determine IV antibiotics.   Team Discussion: Patient with CHF; no edema on fluid restriction. Oral gel for mouth ulcer. DM; CBGs managed with insulin; plan changed to metformin as at home.  Hyponatremia addressed. Patient, family concerned about wound care after discharge. MRI pending ; IV abx questioned.   Patient on target to meet rehab goals: yes, currently needs supervision for upper body care and max for lower body care and toileting.  Needs mod assist for bed mobility and mod-max for sit -stand  and mod assist for transfers.  Able to ambulate short distances with min assist. Has met her swallowing goal. Cognitively at a supervision - min  assist goal.  Min assist goals overall  *See Care Plan and progress notes for long and short-term goals.   Revisions to Treatment Plan:  Decrease SLP services to 2-3 x/week   Teaching Needs: Safety, skin care/wound care, medications, dietary modification recommendations, IV abx, transfers, toileting, etc.   Current Barriers to Discharge: Decreased caregiver support, Home enviroment access/layout, and Wound care  Possible Resolutions to Barriers: Family education HH follow up services     Medical Summary Current Status: generally improved pain. cognition also improving. still on mild FR  for hyponatremia. follow up MRI of brain wednesday to assess need for abx  Barriers to Discharge: Medical stability;Infection/IV Antibiotics   Possible Resolutions to [employer] Focus: f/u MRI as above. potentially will dc abx Thursday. continued pain mgt, regular labs and VS to assess pt medical status   Continued Need for Acute Rehabilitation Level of Care: The patient requires daily medical management by a physician with specialized training in physical medicine and rehabilitation for the following reasons: Direction of a multidisciplinary physical rehabilitation program to maximize functional independence : Yes Medical management of patient stability for increased activity during participation in an intensive rehabilitation regime.: Yes Analysis of laboratory values and/or radiology reports with any subsequent need for medication adjustment and/or medical intervention. : Yes   I attest that I was present, lead the team conference, and concur with the assessment and plan of the team.   Dorien Chihuahua B 09/29/2022, 1:14 PM

## 2022-09-29 NOTE — Progress Notes (Signed)
Physical Therapy Session Note  Patient Details  Name: [name] MRN: [id number] Date of Birth: [birth year]  Today's Date: 09/29/2022 PT Individual Time: 1001-1044 PT Individual Time Calculation (min): 43 min   Short Term Goals: Week 3:  PT Short Term Goal 1 (Week 3): Pt will complete bed mobility with modA consistently. PT Short Term Goal 2 (Week 3): Pt will completes bed to chair transfers iwth modA consistently. PT Short Term Goal 3 (Week 3): Pt will ambulate x10' with modA +2 and LRAD.  Skilled Therapeutic Interventions/Progress Updates:     Pt received supine in bed and agrees to therapy, but states she has had a "horrible" morning. Reports pain in back primarily. PT provides rest breaks and repositioning to manage pain. Pt performs supine to sit with logrolling and modA, with cues for body mechanics and sequencing. Pt performs sito stand with modA facilitation at hips and trunk for extension. Pt performs stand step transfer to [facility] with modA and cues for sequencing. RN assesses pt's wound while pt is seated on BSC. Following toileting, pt performs stand step transfer back to bed, then following seated rest break, pt transfers to [facility] to trial. Pt requests to return to bed so PT provides modA for stand step back to bed. Pt left supine with all needs within reach.   Therapy Documentation Precautions:  Precautions Precautions: Fall, Back Precaution Comments: trach, rectal tube, foley Required Braces or Orthoses: Other Brace Other Brace: prevlon boots Restrictions Weight Bearing Restrictions: No Other Position/Activity Restrictions: external rotation R hip at baseline at all times, RLE shorter than LLE, pt ambulated household distances PTA, w/c for community   Therapy/Group: Individual Therapy  Breck Coons, PT, DPT 09/29/2022, 5:38 PM

## 2022-09-29 NOTE — Progress Notes (Signed)
Patient requested personal care at approximately 805 this AM. Writer met NT in room to help assist with transferring patient to [facility]. Dr. Naaman Plummer entered the room prior to transferring or administering AM medications. Patient complaining that she did not like 3 people in her room and requested that Probation officer and NT leave. When Dr. Mamie Laurel left the room patient was transported to MRI. Will administer AM medications when patient returns.

## 2022-09-30 DIAGNOSIS — F4323 Adjustment disorder with mixed anxiety and depressed mood: Secondary | ICD-10-CM

## 2022-09-30 DIAGNOSIS — T8131XS Disruption of external operation (surgical) wound, not elsewhere classified, sequela: Secondary | ICD-10-CM

## 2022-09-30 DIAGNOSIS — R4586 Emotional lability: Secondary | ICD-10-CM

## 2022-09-30 LAB — BASIC METABOLIC PANEL
Anion gap: 9 (ref 5–15)
BUN: 7 mg/dL — ABNORMAL LOW (ref 8–23)
CO2: 23 mmol/L (ref 22–32)
Calcium: 9.1 mg/dL (ref 8.9–10.3)
Chloride: 98 mmol/L (ref 98–111)
Creatinine, Ser: 0.38 mg/dL — ABNORMAL LOW (ref 0.44–1.00)
GFR, Estimated: >60 mL/min (ref >60)
Glucose, Bld: 176 mg/dL — ABNORMAL HIGH (ref 70–99)
Potassium: 4 mmol/L (ref 3.5–5.1)
Sodium: 130 mmol/L — ABNORMAL LOW (ref 135–145)

## 2022-09-30 MED ORDER — SODIUM CHLORIDE 0.9 % IV SOLN
2.0000 g | Freq: Once | INTRAVENOUS | Status: AC
  Administered 2022-09-30: 2 g via INTRAVENOUS
  Filled 2022-09-30: qty 12.5

## 2022-09-30 MED ORDER — COLLAGENASE 250 UNIT/GM EX OINT
TOPICAL_OINTMENT | Freq: Every day | CUTANEOUS | Status: DC
  Filled 2022-09-30: qty 30

## 2022-09-30 NOTE — Progress Notes (Signed)
Physical Therapy Session Note  Patient Details  Name: [name] MRN: [id number] Date of Birth: 10/10/1955  Today's Date: 09/30/2022 PT Individual Time: [id number] PT Individual Time Calculation (min): 71 min   Short Term Goals: Week 3:  PT Short Term Goal 1 (Week 3): Pt will complete bed mobility with modA consistently. PT Short Term Goal 2 (Week 3): Pt will completes bed to chair transfers iwth modA consistently. PT Short Term Goal 3 (Week 3): Pt will ambulate x10' with modA +2 and LRAD.  Skilled Therapeutic Interventions/Progress Updates:     Pt received supine in bed and agrees to therapy. Rec therapist present for cotreat. Reports pain in back. PT proivdes rest breaks as needed to manage pain. Pt perform supine to sit with modA and cues for sequencing, with improved efficiency and independence with transfer relative to previous sessions. Stand step transfer to [facility] with modA and cues for anterior weight shift, positioning, and increasing eccentric control of stand to sit for safety and strengthening. WC transport to gym. Pt performs multiple reps of sit to stand with RW and cues for hand placement, body mechanics, and use of power to improve independence with transfers. Pt ambulates bouts of x17' and x10' with extended seated rest break in between. Pt ambulates with RW and minA, with +2 WC follow for safety. PT provides cues for posture, RW management, and increased hip and trunk extension.   Pt then performs block training of sit<>stand to work on strengthening and independence of transfer. PT notes that RW is too tall for pt, so provides pt with junior walker. Pt performs x5 reps of sit to stand with minA overall to facilitate anterior weight shift and powering up. Cues provided for anterior trunk lean to assist with safe return to sitting. WC transport back to room. Left seated with all needs within reach   Therapy Documentation Precautions:  Precautions Precautions: Fall,  Back Precaution Comments: trach, rectal tube, foley Required Braces or Orthoses: Other Brace Other Brace: prevlon boots Restrictions Weight Bearing Restrictions: No Other Position/Activity Restrictions: external rotation R hip at baseline at all times, RLE shorter than LLE, pt ambulated household distances PTA, w/c for community   Therapy/Group: Individual Therapy  Breck Coons, PT, DPT 09/30/2022, 5:07 PM

## 2022-09-30 NOTE — Progress Notes (Signed)
Occupational Therapy Session Note  Patient Details  Name: [name] MRN: [id number] Date of Birth: November 09, 1955  Today's Date: 09/30/2022 OT Individual Time: [id number] OT Individual Time Calculation (min): 30 min    Short Term Goals: Week 4:  OT Short Term Goal 1 (Week 4): Pt will complete stand pivot consistently with mod A OT Short Term Goal 2 (Week 4): Pt will complete sit > stand with mod A consistently OT Short Term Goal 3 (Week 4): Pt will stand statically with BUE support and no more than min A  Skilled Therapeutic Interventions/Progress Updates:    Pt greeted in TIS wc finishing conversation with PA regarding her extension at CIR. Pt with brighter affect and more understanding of her need to stay longer at CIR. Addressed standing balance/endurance standing at the sink to don her makeup. She needed mod A to stand by pushing up from wc arm rests. Pt then used sink to steady herself. Pt tolerated standing for ~6 and was able to remove unilateral UE from the sink in order to put on make up. Pt eventually had to sit to rest. Shirt donned with pt able to get UE's overhead to place shirt. She also could get UE's to brush the back of her hair, but had difficulty placing her pony tail far enough back. Pt handoff to SLP for next therapy session.   Therapy Documentation Precautions:  Precautions Precautions: Fall, Back Precaution Comments: trach, rectal tube, foley Required Braces or Orthoses: Other Brace Other Brace: prevlon boots Restrictions Weight Bearing Restrictions: No Other Position/Activity Restrictions: external rotation R hip at baseline at all times, RLE shorter than LLE, pt ambulated household distances PTA, w/c for community Pain: Pain Assessment Pain Scale: 0-10 Pain Score: 7  Pain Type: Acute pain Pain Intervention(s): Medication (See eMAR)  Therapy/Group: Individual Therapy  Valma Cava 09/30/2022, 10:51 PM

## 2022-09-30 NOTE — Evaluation (Signed)
Recreational Therapy Assessment and Plan  Patient Details  Name: [name] MRN: [id number] Date of Birth: 11/14/1955 Today's Date: 09/30/2022  Rehab Potential:  Good ELOS:   d/c 3/27  Assessment Hospital Problem: Principal Problem:   Infection of lumbar spine ([place])     Past Medical History:      Past Medical History:  Diagnosis Date   Anemia     Anxiety     Arthritis     Bipolar disorder ([place])     Depression     GERD (gastroesophageal reflux disease)     Heart murmur      "related to VSD"   High cholesterol     History of blood transfusion      "related to OR" (08/19/2016)   History of hiatal hernia     Hypertension     Hyperthyroidism     Mild cognitive impairment 09/06/2018   Paroxysmal ventricular tachycardia (HCC)     Type II diabetes mellitus ([place])     UTI (urinary tract infection)      being treated with Keflex   Ventricular septal defect      Past Surgical History:       Past Surgical History:  Procedure Laterality Date   ABDOMINAL HYSTERECTOMY       BACK SURGERY       CARDIAC CATHETERIZATION N/A 06/23/2015    Procedure: Left Heart Cath and Coronary Angiography;  Surgeon: Larey Dresser, MD;  Location: [place] [facility];  Service: Cardiovascular;  Laterality: N/A;   CARDIAC CATHETERIZATION   1960    "VSD was so small; didn't need repaired"   EXAM UNDER ANESTHESIA WITH MANIPULATION OF HIP Right 06/02/2014    dr Rhona Raider   FRACTURE SURGERY       HERNIA REPAIR       HIP CLOSED REDUCTION Right 06/02/2014    Procedure: CLOSED MANIPULATION HIP;  Surgeon: Hessie Dibble, MD;  Location: [place];  Service: Orthopedics;  Laterality: Right;   JOINT REPLACEMENT       JOINT REPLACEMENT       POSTERIOR LUMBAR FUSION 4 LEVEL N/A 06/26/2022    Procedure: Lumbar One To Lumbar Five Posterior Instrumented Fusion;  Surgeon: Judith Part, MD;  Location: [place];  Service: Neurosurgery;  Laterality: N/A;   REFRACTIVE SURGERY Bilateral     SHOULDER  ARTHROSCOPY Right     SHOULDER OPEN ROTATOR CUFF REPAIR Right     SPINAL FUSION   1996    "t10 down to my coccyx   SPINE HARDWARE REMOVAL       TOTAL ABDOMINAL HYSTERECTOMY       TOTAL HIP ARTHROPLASTY Right 05/10/2014    [place]      by dr Harrell Gave olcott   TOTAL KNEE ARTHROPLASTY Left     TOTAL SHOULDER ARTHROPLASTY Left 08/19/2016    Procedure: TOTAL SHOULDER ARTHROPLASTY;  Surgeon: Tania Ade, MD;  Location: [place];  Service: Orthopedics;  Laterality: Left;  Left total shoulder replacement      Assessment & Plan Clinical Impression: [name] with history of PVT, T2DM, small membranous VSD, depression, bipolar d/o, mild cognitive impairment,, fall 05/2022 with 3 column unstable fracture through prior scoliosis construct s/p instrumentation with exposed hardware and wound dehiscence. She was admitted on 08/09/2022 for revision of complex lumbar wound, removal of L1-L5 lumbar screws and rods with revision L1-L5 posterior fusion by Dr. [name].    Postop had issues with fatigue cough, hypotension  as well as confusion overnight on 01/24. BLE Dopplers ordered for follow-up on 01/22 and was negative for DVT and showed hypoechoic collection in left popliteal fossa.  Foley placed due to ongoing issues with urinary retention.  Due to ongoing confusion and lethargy, antibiotics added due to concerns of UTI and oxycodone dose decreased due to concerns of side effects.  She started developing drainage from her wound on 01/28 as well as watery diarrhea and stool was negative for C. difficile.   She had rising white count to 19.9 with lactic acidosis on 01/31.  She was started on broad-spectrum antibiotics and continued to be febrile with Tmax 103.  ID consulted for input due to ongoing fevers and recommended changing antibiotics to vancomycin, cefepime and metronidazole as well as repeat CTA chest/CT spine.  Chest CT showed multifocal patchy or base opacities throughout RUL,  RML and bilateral lower lobes worrisome for multifocal pneumonia as well as acute/subacute right 9-10 rib Fx and CT lumbar spine showed subacute fracture through L3 vertebral body and increased air-fluid and posterior soft tissues unexpected in setting of recent surgery.  CT head was negative for acute changes.     [name] consulted due to worsening of respiratory status due to sepsis from acute meningitis and asked patient pneumonia and she required intubation on 02/01.  Neurology consulted due to concerns of seizures and EEG showed moderate diffuse encephalopathy.  MRI brain done showing punctate foci of acute ischemia right frontal lobe and diffusion abnormality within the occipital horn of left lateral ventricle with proteinaceous/purulent debris.  MRI spine showed L2-L5 fusion widely patent with decrease in bone marrow edema L3.  Dr. [name] recommended continuing pain and Flagyl for aspiration pneumonia as well as CNS penetration and given hardware exposure, she would require at least 6 weeks of treatment empirically to treat osteomyelitis.  Tracheal aspirate positive for Candida but not felt to be a pathogen in lung per ID.   Blood cultures x 2 negative.  Core track placed for nutritional support. She was briefly extubated on 02/06 but reintubated due to fatigue and increased WOB.  She underwent tracheostomy on 02/06, was extubated to ATC and is tolerating PMV.  Follow-up MRI brain 12/13 showed "dependent diffusion abnormality in occipital horn of left lateral ventricle with ependymal enhancement concerning for ventriculitis". MBS done on 02/12 and patient cleared for regular diet w/thins. She did have emesis with episode of desaturation and copious secretions on 02/13. She has had multiple voiding trials--last on 02/10 and foley was replaced. Wound VAC changed today and reports of fecal management system in place due to ongoing loose stool and MASD/prevent wound contamination. Trach to be downsized to CFS#  4 today.    PT/OT has been working with patient who is showing participation and progression with therapy. She is able to transfer on LLE with +2 max assist. She continues to be limited by weakness, cognitive deficits requiring increased time to process as well as verbal and tactile cues to follow simple commands, displays poor attention and memory. CIR recommended due to functional decline.   Patient transferred to CIR on 09/03/2022.   Pt presents with decreased activity tolerance, decreased functional mobility, decreased balance, decreased memory, decreased problem solving, feelings of stress Limiting pt's independence with leisure/community pursuits.  Met with pt today during co-treat with PT to discuss TR services including leisure education, activity analysis/modifications and stress management.  Also discussed the importance of social, emotional, spiritual health in addition to physical health and their  effects on overall health and wellness.  Pt stated understanding.  Pt easily engaged in conversation with CTRS and appreciative of this visit.  Pt shared her leisure interests and is hopeful to return to these enjoyed activities in the future.  Pt with questions about reintegrating back into the social settings given her cognitive deficits and requested further education about this.    Plan  Min 1 TR session >20 minutes during LOS  Recommendations for other services: Neuropsych  Discharge Criteria: Patient will be discharged from TR if patient refuses treatment 3 consecutive times without medical reason.  If treatment goals not met, if there is a change in medical status, if patient makes no progress towards goals or if patient is discharged from hospital.  The above assessment, treatment plan, treatment alternatives and goals were discussed and mutually agreed upon: by patient  [place] 09/30/2022, 4:24 PM

## 2022-09-30 NOTE — Consult Note (Signed)
Neuropsychological Consultation Comprehensive Inpatient Rehab   Patient:   [name]   DOB:   1956-01-04  MR Number:  [id number]  Location:  [facility] [id number] [place] [address] Dept: [place]: [phone]           Date of Service:   09/29/2022  Start Time:   3 PM End Time:   4 PM  Today's visit was conducted in the inpatient comprehensive inpatient rehabilitation unit.  The patient and myself were present for this visit.  Provider/Observer:  [name], Psy.D.       Clinical Neuropsychologist       Billing Code/Service: [phone]  Reason for Service:    [name] is a 67 year old female referred for neuropsychological consultation during her ongoing CIR therapeutic interventions.  The patient has been followed by [name] and most recently saw Marissa Denmark, NP with a diagnosis of mild cognitive impairment and has been the primary patient of Marissa Fast, MD.  Considerations have been made previously about the diagnosis of cognitive change with the family history of diagnosis of Alzheimer's and the patient has been maintained on Aricept as a prophylactic treatment for cognitive difficulties.  The patient also has a significant long-term psychiatric condition and was first diagnosed when she was 67 years old with bipolar disorder with symptoms that go back to her late teens early 45s.  Patient has had significant depression and manic/hypomanic episodes in the past.  Patient has had acute encephalopathic status with a recent hospitalization and is improving but continues to have confusion and reduced cognitive functioning from baseline.  However the patient is improving and becoming more oriented.  Some of her psychiatric medications have been held recently because of potential interactions with her acute medical status including the patient's  previous home medicine Seroquel (700 mg/day).  The patient continues with ongoing confusion and improving encephalopathic status due to infection/sepsis.  Patient with significant medical history of both long-term as well as acute status and details of this can be found below where a copy of her current HPI is included for convenience.  During today's visit, which was requested due to the patient having a great deal of difficulty over the past couple of days with agitation around the time her stay on the unit was extended, the patient was laying in her bed but clearly frustrated and agitated when I spoke to her.  The patient had a list of concerns and frustrations and voiced questions about why I was asked to return to see her and verbalized feeling like people were listening to her and her concerns.  The patient talked about her pre-existing work with a therapist and a psychiatrist and that she did not "need anyone else to address her concerns."  The patient denied feeling like there was an exacerbation of her mood disorder and felt like her current frustration was consistent and congruent with her status.  With attempts to explain the purpose and goals of my coming to visit her today the patient became increasingly agitated and ultimately asked that I leave.  I tried to address the patient's concerns to no avail.  HPI for the current admission:    HPI:  [name] is a 67 year old female with history of PVT, T2DM, small membranous VSD, depression, bipolar d/o, mild cognitive impairment,, fall 05/2022 with 3 column unstable fracture through prior scoliosis construct s/p instrumentation  with exposed hardware and wound dehiscence. She was admitted on 08/09/2022 for revision of complex lumbar wound, removal of L1-L5 lumbar screws and rods with revision L1-L5 posterior fusion by Dr. Venetia [name].    Postop had issues with fatigue cough, hypotension as well as confusion overnight on 01/24. BLE Dopplers  ordered for follow-up on 01/22 and was negative for DVT and showed hypoechoic collection in left popliteal fossa.  Foley placed due to ongoing issues with urinary retention.  Due to ongoing confusion and lethargy, antibiotics added due to concerns of UTI and oxycodone dose decreased due to concerns of side effects.  She started developing drainage from her wound on 01/28 as well as watery diarrhea and stool was negative for C. difficile.   She had rising white count to 19.9 with lactic acidosis on 01/31.  She was started on broad-spectrum antibiotics and continued to be febrile with Tmax 103.  ID consulted for input due to ongoing fevers and recommended changing antibiotics to vancomycin, cefepime and metronidazole as well as repeat CTA chest/CT spine.  Chest CT showed multifocal patchy or base opacities throughout RUL, RML and bilateral lower lobes worrisome for multifocal pneumonia as well as acute/subacute right 9-10 rib Fx and CT lumbar spine showed subacute fracture through L3 vertebral body and increased air-fluid and posterior soft tissues unexpected in setting of recent surgery.  CT head was negative for acute changes.     [name] consulted due to worsening of respiratory status due to sepsis from acute meningitis and asked patient pneumonia and she required intubation on 02/01.  Neurology consulted due to concerns of seizures and EEG showed moderate diffuse encephalopathy.  MRI brain done showing punctate foci of acute ischemia right frontal lobe and diffusion abnormality within the occipital horn of left lateral ventricle with proteinaceous/purulent debris.  MRI spine showed L2-L5 fusion widely patent with decrease in bone marrow edema L3.  Dr. Tommy [name] recommended continuing pain and Flagyl for aspiration pneumonia as well as CNS penetration and given hardware exposure, she would require at least 6 weeks of treatment empirically to treat osteomyelitis.  Tracheal aspirate positive for Candida but not felt  to be a pathogen in lung per ID.   Blood cultures x 2 negative.  Core track placed for nutritional support. She was briefly extubated on 02/06 but reintubated due to fatigue and increased WOB.  She underwent tracheostomy on 02/06, was extubated to ATC and is tolerating PMV.  Follow-up MRI brain 12/13 showed "dependent diffusion abnormality in occipital horn of left lateral ventricle with ependymal enhancement concerning for ventriculitis". MBS done on 02/12 and patient cleared for regular diet w/thins. She did have emesis with episode of desaturation and copious secretions on 02/13. She has had multiple voiding trials--last on 02/10 and foley was replaced. Wound VAC changed today and reports of fecal management system in place due to ongoing loose stool and MASD/prevent wound contamination. Trach to be downsized to CFS# 4 today.    PT/OT has been working with patient who is showing participation and progression with therapy. She is able to transfer on LLE with +2 max assist. She continues to be limited by weakness, cognitive deficits requiring increased time to process as well as verbal and tactile cues to follow simple commands, displays poor attention and memory. CIR recommended due to functional decline.   Medical History:   Past Medical History:  Diagnosis Date   Anemia    Anxiety    Arthritis    Bipolar disorder ([place])  Depression    GERD (gastroesophageal reflux disease)    Heart murmur    "related to VSD"   High cholesterol    History of blood transfusion    "related to OR" (08/19/2016)   History of hiatal hernia    Hypertension    Hyperthyroidism    Mild cognitive impairment 09/06/2018   Paroxysmal ventricular tachycardia (H[facility])    Type II diabetes mellitus ([place])    UTI (urinary tract infection)    being treated with Keflex   Ventricular septal defect          Patient Active Problem List   Diagnosis Date Noted   Adjustment disorder with mixed anxiety and depressed mood  09/30/2022   Chronic wound 09/25/2022   Hyperglycemia 09/25/2022   Chronic midline thoracic back pain 09/17/2022   Primary insomnia 09/17/2022   Chronic combined systolic and diastolic CHF (congestive heart failure) ([place]) 09/14/2022   Acute blood loss anemia 09/09/2022   Infection of lumbar spine ([place]) 09/03/2022   Malnutrition of moderate degree 08/20/2022   Bacterial meningitis [id number]   Hardware complicating wound infection ([place]) 08/20/2022   Infection of deep incisional surgical site after procedure 08/20/2022   Encephalopathy 08/19/2022   Aspiration pneumonia of both lungs ([place]) 08/19/2022   Acute respiratory failure with hypoxia ([place]) 08/19/2022   Sepsis ([place]) 08/19/2022   Seizure ([place]) 08/19/2022   Wound dehiscence 08/09/2022   Delayed surgical wound healing 07/08/2022   Fracture of lumbar spine without cord injury ([place]) 06/26/2022   Lumbar vertebral fracture ([place]) 06/25/2022   Mood disorder ([place]) 09/29/2021   Mild cognitive impairment 09/06/2018   Hyponatremia 02/28/2018   S/P shoulder replacement, left 08/19/2016   Anxiety 10/17/2014   Acid reflux 10/17/2014   BP (high blood pressure) 10/17/2014   Arthritis, degenerative 10/17/2014   Adult hypothyroidism 10/17/2014   UTI (urinary tract infection) 06/03/2014   Fracture of bone adjacent to prosthesis 06/03/2014   Diabetes ([place]) 06/02/2014   Peri-prosthetic fracture of femur following total hip arthroplasty 06/02/2014   Bladder retention 05/13/2014   Chronic pain 05/10/2014   History of hip surgery 05/10/2014   UNSPECIFIED HEART FAILURE 06/24/2010   UNSPECIFIED CONGENITAL DEFECT OF SEPTAL CLOSURE 06/24/2010   TOBACCO ABUSE 03/18/2010   Secondary cardiomyopathy ([place]) 03/18/2010   DM 06/13/2009   HYPERTENSION, UNSPECIFIED 06/13/2009   VENTRICULAR TACHYCARDIA 06/13/2009   VENTRICULAR SEPTAL DEFECT, CONGENITAL 06/13/2009   Diabetes mellitus, type 2 ([place]) 06/13/2009   Essential (primary) hypertension 06/13/2009     Behavioral Observation/Mental Status:   [name]  presents as a 67 y.o.-year-old Right handed Caucasian Female who appeared her stated age. her dress was Appropriate and she was Well Groomed and her manners were Appropriate to the situation.  her participation was indicative of Inattentive and Redirectable behaviors.  There were physical disabilities noted.  she displayed an appropriate level of cooperation and motivation.    Interactions:    Active Inattentive  Attention:   abnormal and attention span appeared shorter than expected for age  Memory:   abnormal; global memory impairment noted  Visuo-spatial:   not examined  Speech (Volume):  normal  Speech:   normal; normal  Thought Process:  Circumstantial and Tangential  Circumstantial, Organized, and Tangential  Though Content:  WNL; not suicidal and not homicidal  Orientation:   person and place  Judgment:   Poor  Planning:   Poor  Affect:    Appropriate  Mood:    Euthymic  Insight:   Shallow  Intelligence:  normal  Psychiatric History:  Patient with significant past psychiatric history including a history of bipolar affective disorder and anxiety/depression that goes back for many years primarily being followed currently by PCP.  Patient has also been followed by neurology because of some mild memory/cognitive impairments for the past several years with a family history of diagnosis of Alzheimer's.  The patient was [facility] at [facility] point [facility] and run through batteries of test that are somewhat predictable Alzheimer's potential and not found to have any genetic markers etc.  Family Med/Psych History:  Family History  Problem Relation Age of Onset   Other Mother        alive   Stroke Father 72       deceased   Dementia Father    Chorea Maternal Grandfather    Dementia Maternal Aunt    Dementia Maternal Aunt    Heart attack Other        multiple uncles have died with myocardial  infarction    Impression/DX:   HPI:  [name] is a 67 year old female with history of PVT, T2DM, small membranous VSD, depression, bipolar d/o, mild cognitive impairment,, fall 05/2022 with 3 column unstable fracture through prior scoliosis construct s/p instrumentation with exposed hardware and wound dehiscence. She was admitted on 08/09/2022 for revision of complex lumbar wound, removal of L1-L5 lumbar screws and rods with revision L1-L5 posterior fusion by Dr. Venetia [name].    Postop had issues with fatigue cough, hypotension as well as confusion overnight on 01/24. BLE Dopplers ordered for follow-up on 01/22 and was negative for DVT and showed hypoechoic collection in left popliteal fossa.  Foley placed due to ongoing issues with urinary retention.  Due to ongoing confusion and lethargy, antibiotics added due to concerns of UTI and oxycodone dose decreased due to concerns of side effects.  She started developing drainage from her wound on 01/28 as well as watery diarrhea and stool was negative for C. difficile.   She had rising white count to 19.9 with lactic acidosis on 01/31.  She was started on broad-spectrum antibiotics and continued to be febrile with Tmax 103.  ID consulted for input due to ongoing fevers and recommended changing antibiotics to vancomycin, cefepime and metronidazole as well as repeat CTA chest/CT spine.  Chest CT showed multifocal patchy or base opacities throughout RUL, RML and bilateral lower lobes worrisome for multifocal pneumonia as well as acute/subacute right 9-10 rib Fx and CT lumbar spine showed subacute fracture through L3 vertebral body and increased air-fluid and posterior soft tissues unexpected in setting of recent surgery.  CT head was negative for acute changes.     [name] consulted due to worsening of respiratory status due to sepsis from acute meningitis and asked patient pneumonia and she required intubation on 02/01.  Neurology consulted due to concerns  of seizures and EEG showed moderate diffuse encephalopathy.  MRI brain done showing punctate foci of acute ischemia right frontal lobe and diffusion abnormality within the occipital horn of left lateral ventricle with proteinaceous/purulent debris.  MRI spine showed L2-L5 fusion widely patent with decrease in bone marrow edema L3.  Dr. Tommy [name] recommended continuing pain and Flagyl for aspiration pneumonia as well as CNS penetration and given hardware exposure, she would require at least 6 weeks of treatment empirically to treat osteomyelitis.  Tracheal aspirate positive for Candida but not felt to be a pathogen in lung per ID.   Blood cultures x 2 negative.  Core track placed for nutritional support. She was briefly extubated on 02/06 but reintubated due to fatigue and increased WOB.  She underwent tracheostomy on 02/06, was extubated to ATC and is tolerating PMV.  Follow-up MRI brain 12/13 showed "dependent diffusion abnormality in occipital horn of left lateral ventricle with ependymal enhancement concerning for ventriculitis". MBS done on 02/12 and patient cleared for regular diet w/thins. She did have emesis with episode of desaturation and copious secretions on 02/13. She has had multiple voiding trials--last on 02/10 and foley was replaced. Wound VAC changed today and reports of fecal management system in place due to ongoing loose stool and MASD/prevent wound contamination. Trach to be downsized to CFS# 4 today.    PT/OT has been working with patient who is showing participation and progression with therapy. She is able to transfer on LLE with +2 max assist. She continues to be limited by weakness, cognitive deficits requiring increased time to process as well as verbal and tactile cues to follow simple commands, displays poor attention and memory. CIR recommended due to functional decline.   Disposition/Plan:  PDuring today's visit, which was requested due to the patient having a great deal of  difficulty over the past couple of days with agitation around the time her stay on the unit was extended, the patient was laying in her bed but clearly frustrated and agitated when I spoke to her.  The patient had a list of concerns and frustrations and voiced questions about why I was asked to return to see her and verbalized feeling like people were listening to her and her concerns.  The patient talked about her pre-existing work with a therapist and a psychiatrist and that she did not "need anyone else to address her concerns."  The patient denied feeling like there was an exacerbation of her mood disorder and felt like her current frustration was consistent and congruent with her status.  With attempts to explain the purpose and goals of my coming to visit her today the patient became increasingly agitated and ultimately asked that I leave.  I tried to address the patient's concerns to no avail.         Electronically Signed   _______________________ [name], Psy.D. Clinical Neuropsychologist

## 2022-09-30 NOTE — Consult Note (Signed)
[name] Nurse wound follow up Wound type:surgical site dehiscence Measurement:1 cm x 0.4 cm x 1.5 cm increase in slough noted today.  30% slough  MD has ordered to begin Santyl for debridement.   Distal wound is fully granulated, silicone foam placed on top.  Wound bed: see above Drainage (amount, consistency, odor) minimal serosanguinous no odor Periwound: closed incision line in center.  Intact Dressing procedure/placement/frequency: Santyl and NS moist gauze to open wound  silicone foam to lower wound daily. Will follow  Estrellita Ludwig MSN, RN, FNP-BC [facility] Wound, Ostomy, Continence Nurse [facility] [phone] Pager [phone]

## 2022-09-30 NOTE — Progress Notes (Addendum)
PROGRESS NOTE   Subjective/Complaints:  Pt upset that a decision to lengthen her stay was made without her involvement in the conversation. Family supports longer stay. Pt has come to terms with extended stay to some extent, but perseverating on the fact that she was not involve in the decision. Son had questions about dressings and that they weren't being done consistently.   ROS: Patient denies fever, rash, sore throat, blurred vision, dizziness, nausea, vomiting, diarrhea, cough, shortness of breath or chest pain,   headache, or mood change.   Objective:   MR BRAIN W WO CONTRAST  Result Date: 09/29/2022 CLINICAL DATA:  Follow-up ventriculitis. EXAM: MRI HEAD WITHOUT AND WITH CONTRAST TECHNIQUE: Multiplanar, multiecho pulse sequences of the brain and surrounding structures were obtained without and with intravenous contrast. CONTRAST:  74m GADAVIST GADOBUTROL 1 MMOL/ML IV SOLN COMPARISON:  08/31/2022 FINDINGS: Brain: Resolved debris by diffusion and FLAIR imaging at the level of the ventricles. Allowing for motion artifact no sulcal debris is seen either. Resolved ependymal enhancement. Chronic small vessel ischemia in the cerebral white matter and to a greater extent in the pons. No acute or subacute infarct, hydrocephalus, or collection. Vascular: Mid major flow voids and vascular enhancements are preserved Skull and upper cervical spine: Normal marrow signal Sinuses/Orbits: Unremarkable Other: Intermittent pervasive motion artifact IMPRESSION: Resolved ventriculitis findings.  No new abnormality. Motion artifact Electronically Signed   By: JJorje GuildM.D.   On: 09/29/2022 11:16   No results for input(s): "WBC", "HGB", "HCT", "PLT" in the last 72 hours.     No results for input(s): "NA", "K", "CL", "CO2", "GLUCOSE", "BUN", "CREATININE", "CALCIUM" in the last 72 hours.   Intake/Output Summary (Last 24 hours) at 09/30/2022  0953 Last data filed at 09/30/2022 0813 Gross per 24 hour  Intake 1077 ml  Output 3600 ml  Net -2523 ml        Physical Exam: Vital Signs Blood pressure 132/79, pulse 90, temperature 98.4 F (36.9 C), temperature source Oral, resp. rate 16, height '5\' 1"'$  (1.549 m), weight 64.8 kg, SpO2 99 %.   Constitutional: No distress . Vital signs reviewed. HEENT: NCAT, EOMI, oral membranes moist Neck: supple Cardiovascular: RRR without murmur. No JVD    Respiratory/Chest: CTA Bilaterally without wheezes or rales. Normal effort    GI/Abdomen: BS +, non-tender, non-distended Ext: no clubbing, cyanosis, or edema Psych: anxious, irritable but improved, more cooperative  Skin: lower back wound almost closed. Upper back wound with cavity, aquacel had fallen out of wound. Sl serous drainage on gauze. Neurological: Follows commands, some STM deficits. Decreased insight still Alert and oriented x3, no hypertonia noted, CN 2-12 grossly intact. Motor 4/4 UE and 3-4/5 LE prox to distal.--did a reasonable job with bed mobility MSK: LB  remains tender. Tenderness R chest wall    Assessment/Plan: 1. Functional deficits which require 3+ hours per day of interdisciplinary therapy in a comprehensive inpatient rehab setting. Physiatrist is providing close team supervision and 24 hour management of active medical problems listed below. Physiatrist and rehab team continue to assess barriers to discharge/monitor patient progress toward functional and medical goals  Care Tool:  Bathing  Bathing activity did not occur:  Refused           Bathing assist       Upper Body Dressing/Undressing Upper body dressing   What is the patient wearing?: Hospital gown only    Upper body assist Assist Level: Total Assistance - Patient < 25%    Lower Body Dressing/Undressing Lower body dressing      What is the patient wearing?: Hospital gown only     Lower body assist Assist for lower body dressing: Total  Assistance - Patient < 25%     Toileting Toileting Toileting Activity did not occur Landscape architect and hygiene only): N/A (no void or bm) (using catheter and rectal tube)  Toileting assist Assist for toileting: Dependent - Patient 0%     Transfers Chair/bed transfer  Transfers assist  Chair/bed transfer activity did not occur: Safety/medical concerns  Chair/bed transfer assist level: Maximal Assistance - Patient 25 - 49%     Locomotion Ambulation   Ambulation assist   Ambulation activity did not occur: Safety/medical concerns          Walk 10 feet activity   Assist  Walk 10 feet activity did not occur: Safety/medical concerns        Walk 50 feet activity   Assist Walk 50 feet with 2 turns activity did not occur: Safety/medical concerns         Walk 150 feet activity   Assist Walk 150 feet activity did not occur: Safety/medical concerns         Walk 10 feet on uneven surface  activity   Assist Walk 10 feet on uneven surfaces activity did not occur: Safety/medical concerns         Wheelchair     Assist Is the patient using a wheelchair?: Yes Type of Wheelchair: Manual Wheelchair activity did not occur: Safety/medical concerns         Wheelchair 50 feet with 2 turns activity    Assist    Wheelchair 50 feet with 2 turns activity did not occur: Safety/medical concerns       Wheelchair 150 feet activity     Assist  Wheelchair 150 feet activity did not occur: Safety/medical concerns       Blood pressure 132/79, pulse 90, temperature 98.4 F (36.9 C), temperature source Oral, resp. rate 16, height '5\' 1"'$  (1.549 m), weight 64.8 kg, SpO2 99 %.  Medical Problem List and Plan: 1. Functional deficits secondary to osteomyelitis, infection of lumbar surgical site complicated by meningitis/ventriculitis.             -pt also with significant deconditioning             -patient may not yet shower             -ELOS/Goals:  possible extension to 3/27, Min/Mod A with PT, OT, SLP  -Continue CIR therapies including PT, OT, and SLP    -spoke with pt re: extension. It seems as if she's willing to stay, but remained fixated on the feeling that she wasn't involved in the decision. Tried to explain that both she and her family are a crucial part of the decision making process as it pertains to her medical care, therapy, and discharge planning    2.  Antithrombotics: -DVT/anticoagulation:  Pharmaceutical: Lovenox '40mg'$  QD             -antiplatelet therapy: N/a  3. Pain Management: Butrans 67mg/hr patch for pain control w/ Flexeril '5mg'$  TID, Tylenol PRN, Oxycodone 7.'5mg'$  q4h PRN, Gabapentin  $'100mg'Z$  TID  -09/04/22 increased flexeril to 7.'5mg'$  TID -09/05/22 schedule Oxycodone '5mg'$  q6h and leave 2.'5mg'$  q4h PRN dosing -2/26 tylenol PRN for HA -2/28 Fioricet ordered PRN headache -09/17/22 will change PRN oxycodone 2.'5mg'$  to percocet 5-'325mg'$ , consider increase gabapentin to help with pain and HA- make changes gradually due to hx of encephalopathy -3/5 plan to wean oxycodone during admission- family reports she becomes easily addicted to these types of medication -3/6 continue current pain medications for now  -3/7 Voltaren gel for L shoulder pain -3/8 CXR negative, lidocaine for chest wall tenderness started -09/25/22 pain overall managed, no changes made, encouraged use of PRNs when she feels the need 3/13-14- continue flexeril  '10mg'$  tid, no change in narcotics   4. Mood/Behavior/Sleep: LCSW to follow for evaluation and support.             --Trazodone '100mg'$  QHS and melatonin '5mg'$  QHS PRN for  insomnia/mood  -09/04/22 placed sign on door to minimize interruptions 11p-5a; changed CBG checks as below to minimize interruptions             -antipsychotic agents:  Luvox '100mg'$  QHS and Lamictal '200mg'$  QD  -Continue Aricept '10mg'$  QD  -Seen by neuropsych-appreciate assistance  -3/1 increase melatonin to '10mg'$  dose QHS PRN  -3/4 decrease melatonin  back to '5mg'$  dose QHS PRN -3/11 melatonin '5mg'$  working reasonably well  -3/14 pt somewhat improved today   -is frustrated that she's still in hospital and understandably so   -team continue to provide positive reinforcement as necessary 5. Neuropsych/cognition: This patient is not fully capable of making decisions on her own behalf.  -reviewed cognitive recovery with patient this week. May take until this summer to plateau. Had some mild memory deficits PTA as well.  6. Skin/Wound Care: WOC to follow for wound VAC changes --MASD managed by fecal system-- flexiseal clogged 09/04/22 AM but nursing states it's now working; monitor--might be able to d/c soon? -2/21 WOC stopped VAC, start aquacel and dressing -see flexiseal discussion below, #17 -2/27 WOC following, slough pulled away revealing 2cm deep defect -2/29 Spoke with Dr. Zada Finders today about wound depth, continue local wound care- continue to monitor, continue abx per ID,  no plan for procedure at this time -3/4 Seen by [place]- continue aquacel, abd pad/tape, apprecaite assistance -3/7 WOC rec medihoney, fill wound with moistened gauza, distal portion of wound to be backed with silver hydrofiber, appreciate assistance -3/8 Dr. Zada Finders contacted about wound, feels like this is doing well overall, continue current wound care orders, appreciate assistance -3/14 will change dressing to collagenase to see if we can get better debridement of fibronecrotic tissue. Small piece really needs a bit of debridement  7. Fluids/Electrolytes/Nutrition: Monitor I/O. Continue tube feeds-->likely contributing to diarrhea             -monitor intake closely, continue vitamins -2/19 discontinue core track, Consult dietician -2/27 decrease BMP to every other day -3/14--change labs to weekly  8.Sepsis w/ osteomyelitis/meningitis/encephalitis: Continue Cefepime 2g q8h, Vanc '1250mg'$  q12h, and Flagyl '500mg'$  q8h for at least 6 weeks --3/1 per ID continue  Vanc+Cefepime until 09/30/22 then switch to suppressive PO Levaquin '750mg'$  QD + Doxycycline '100mg'$  BID until f/up outpatient, Dr. [name] appt 10/11/22 at 2pm; metronidazole has been discontinued as of 09/16/22 (was primarily for PNA, no longer needed) -3/13 --reviewed repeat MRI of brain with resolution! Spoke with NS -3/14--dc iv abx after today's doses, picc and transition to oral levquin and doxycycline as above starting tomorrow  9. H/o PVT:  Monitor BP/heart rate TID--on coreg 12.'5mg'$  BID>>up to '25mg'$  BID now, Labetalol '10mg'$  q2h PRN  -09/04/22 BP/HR fairly well controlled, cont regimen, monitor  -2/23  BP has been a little above goal, increase coreg dose to '25mg'$  BID  -09/25/22 BP/HR with fair control, continue to monitor -3/14 bp/hr controlled     09/30/2022    6:06 AM 09/30/2022    5:00 AM 09/29/2022    7:33 PM  Vitals with BMI  Weight  142 lbs 14 oz   BMI  0000000   Systolic [id number]  0000000  Diastolic 79  78  Pulse 90  89      10. Urinary retention: Has failed 4 voiding trails. Has had foley since 12/23             -maintain foley cath, foley mgt per protocol -- Continue Flomax 0.'4mg'$  qPM. Will d/c urecholine.   11. VDRF s/p trach: Tolerating PMSV well -I ordered downsizing to CFS # 4 today--tolerated well -continue ATC w/ pulmonary hygiene -begin day time plugging of trach tomorrow. Decannulate soon. -Trial of capping with respiratory-  can trial 24 hour capping today -2/22 Leave trach uncapped tonight -2/28 plan for decannulation today, has had trach capped for >72hours -2/29 Lurline Idol was  removed - doing well   12. T2DM: Monitor BS ac/hs and use SSI for elevated BS.               --continue liberalized diet for now.  CBG (last 3)  Recent Labs    09/28/22 1205 09/28/22 1655 09/29/22 0621  GLUCAP 176* 127* 126*    -3/14 fair control, receiving 3-6 U Novalog with SSI   - resumed metformin '500mg'$  with breakfast effective--now bid  -consider changing to CM diet -reasonable control at  present 13. Hypokalemia: Recurrent likely due to diarrhea/intake.              --09/18/22 stable K+ 4.2 on 09/17/22, wants to change to BID dosing so switched Klor to 8mq BID (still will have 667m total/day)  -3/12 4.3 yesterday  14. Anemia of critical illness: Hgb 7.5-8.9 range -stable at 9.9    Latest Ref Rng & Units 09/27/2022    5:28 AM 09/20/2022    3:44 AM 09/13/2022    3:40 AM  CBC  WBC 4.0 - 10.5 K/uL 8.0  7.3  7.0   Hemoglobin 12.0 - 15.0 g/dL 9.9  10.1  10.0   Hematocrit 36.0 - 46.0 % 31.6  31.7  30.8   Platelets 150 - 400 K/uL 411  419  406      15. Hyponatremia/hypochloremia: I -09/04/22 Na 129, Cl 95, both stable, continue salt tabs 1g BID -Cortisol testing done during hospitalization (13.8 on 08/30/22), was normal, but daughter and pt want to make sure this is followed up--2/21 129, a little decreased, fluid restriction started -2/28 stable at 131 -09/21/22 Na stable at 130, continue to follow with  q48h BMPs- adjust to 8am labs Fluid restrictions liberalized to 150062m3/11 Na stable at 130, monitor on weekly labs -3/14 cont 1500cc FR for now  16. Encephalopathy/Delirium: improved substantially  17. Loose stools likely d/t TF and abx.            -recent rectal tube, since dc'ed  -generally improved -09/28/22 LBM --mushy but formed  18. Hypothyroidism: continue synthroid 15m75mD  19. Chronic combined CHF, BLE Swelling: recent 2D echo done 08/20/2022 showed apical hypokinesis, LVEF 40-45%, RV function was reduced. Intermittently diuresed  during hospitalization, most recently given '40mg'$  IV lasix on 09/03/22 -09/04/22 noted to still have BLE swelling, start '20mg'$  PO lasix and monitor lytes/BP/swelling -3/5 wt a little up, monitor tend -3/6 asked nutrition to stop milk/carnation- she wasn't drinking but it was counting toward her fluid restriction -3/7 wt stable, fluid restrictions liberalized to 1562m -09/25/22 wt stable, monitor -09/30/22 weight decreased. Not sure it's real  although po intake has been sporadic Filed Weights   09/27/22 0500 09/29/22 0634 09/30/22 0500  Weight: 70.3 kg 67.1 kg 64.8 kg    20. Oral ulcer  --2/28 Orajel for oral ulcer discomfort 21.  Dry mucous membranes, may be related to anticholinergic meds cyclobenzaprine and trazodone, pain and sleep are   better so reluctant to stop at this time  At least 55 total minutes were spent in examination of patient, assessment of pertinent data,  formulation of a treatment plan, and in discussion with patient, family and/or consulting providers.   [place]: [place], 9:53 AM

## 2022-09-30 NOTE — Progress Notes (Signed)
Patient ID: [name], female   DOB: [birth year], 67 y.o.   MRN: [id number]  Met with pt to check on and she reports she is better today and her good news was MRI was clear and her IV antibiotics will be completed and PICC line with be out soon. She is sitting up in her wheelchair and reports he back is less painful today. Will ask [facility] about getting a wheelchair eval so her wheelchair for home will be more fitted for her.

## 2022-09-30 NOTE — Progress Notes (Signed)
Physical Therapy Session Note  Patient Details  Name: [name] MRN: [id number] Date of Birth: Jul 10, 1956  Today's Date: 09/30/2022 PT Individual Time: 0915-1000 PT Individual Time Calculation (min): 45 min   Short Term Goals: Week 3:  PT Short Term Goal 1 (Week 3): Pt will complete bed mobility with modA consistently. PT Short Term Goal 2 (Week 3): Pt will completes bed to chair transfers iwth modA consistently. PT Short Term Goal 3 (Week 3): Pt will ambulate x10' with modA +2 and LRAD.  Skilled Therapeutic Interventions/Progress Updates:    Chart reviewed and pt agreeable to therapy. Pt received semi-reclined in bed with no c/o pain. Also of note, pt and family member relayed feeling very upset about recent care team decision to extend length of stay. PT and pt spent significant time discussing pt's concerns. Of note, pt decided to politely enquire to care team questions about wound care needs and treatment decisions to promote pt understanding of care team decisions and promote pt feeling of participation in care team. Session then focused on functional transfers to promote safe home access. Pt transferred to EOB with MinA. Pt then transferred to [facility] with [place] + 2 + RW. From chair, pt completed 2x sit to stand with ModA +2. At end of session, pt was left seated in [facility] with nurse call bell and all needs in reach. RN notified of pt position and cleared pt to sit in [place].     Therapy Documentation Precautions:  Precautions Precautions: Fall, Back Precaution Comments: trach, rectal tube, foley Required Braces or Orthoses: Other Brace Other Brace: prevlon boots Restrictions Weight Bearing Restrictions: No Other Position/Activity Restrictions: external rotation R hip at baseline at all times, RLE shorter than LLE, pt ambulated household distances PTA, w/c for community   Therapy/Group: Individual Therapy  Marquette Old, PT, DPT 09/30/2022, 10:18 AM

## 2022-09-30 NOTE — Progress Notes (Signed)
Nutrition Follow-up  DOCUMENTATION CODES:   Not applicable  INTERVENTION:  - Continue Ensure Plus High Protein po BID, each supplement provides 350 kcal and 20 grams of protein.   NUTRITION DIAGNOSIS:   Increased nutrient needs related to wound healing as evidenced by estimated needs.  GOAL:   Patient will meet greater than or equal to 90% of their needs  MONITOR:   PO intake, Supplement acceptance, Skin, Labs  REASON FOR ASSESSMENT:   Consult Assessment of nutrition requirement/status  ASSESSMENT:   67 y.o. female admitted to CIR after revision of lumbar surgery after non healing and exposed hardware. Required trach and Cortrak placement. PMH includes HTN, GERD, HLD and T2DM.  Meds reviewed:  pepcid, nutrisource fiber, lasix, cozaar, metformin, klor-con, florastor. Labs reviewed: Na low, BUN/Creatinine low.   Pt reports that she has been eating fair to good. Family is still bringing in food from home as well. Pt also drinking Chocolate milk with meals. Pt is likely meeting her needs at this time. The pt and son at bedside had questions about healthy diet on discharge. RD will attach handout about General Healthy Eating. Will continue to monitor PO intakes.   Diet Order:   Diet Order             Diet regular Fluid consistency: Thin; Fluid restriction: 1500 mL Fluid  Diet effective now                   EDUCATION NEEDS:   No education needs have been identified at this time  Skin:  Skin Assessment: Skin Integrity Issues: Skin Integrity Issues:: Wound VAC, Incisions Wound Vac: Back Incisions: Back  Last BM:  3/14 - type 6  Height:   Ht Readings from Last 1 Encounters:  09/03/22 '5\' 1"'$  (1.549 m)    Weight:   Wt Readings from Last 1 Encounters:  09/30/22 64.8 kg    Ideal Body Weight:  45.5 kg  BMI:  Body mass index is 26.99 kg/m.  Estimated Nutritional Needs:   Kcal:  1800-2000  Protein:  90-110 grams  Fluid:  >/= 1.8 L  Thalia Bloodgood,  RD, LDN, CNSC.

## 2022-09-30 NOTE — Progress Notes (Addendum)
Speech Language Pathology Daily Session Note  Patient Details  Name: [name] MRN: [id number] Date of Birth: 06-23-1956  Today's Date: 09/30/2022 SLP Individual Time: 1115-1200 SLP Individual Time Calculation (min): 45 min  Short Term Goals: Week 5: SLP Short Term Goal 1 (Week 5): STG's = LTG's due to ELOS  Skilled Therapeutic Interventions: Pt seen this date for skilled ST intervention targeting cognitive goals outlined in care plan. Pt received awake/alert and OOB in TIS; finishing with OT. Agreeable to intervention in hospital room. Participatory throughout.   Pt noted to refer to and write in memory notebook with overall Sup A. Attended to functional conversational tasks re: her current concerns in a mildly distracting environment for up to 20 minutes with Sup A. Provided general education re: role of ST, ST POC, and neuropsych. Pt reports she would like to talk with a counselor re: her adjustment concerns and need for coping strategies. Reinforced and educated pt on general coping strategies to include use of journaling, gratitude, and engagement in simple leisure activities (e.g.coloring, mindfulness, etc.); pt verbalized understanding of education and left with coloring page and colored pencils to engage in such activities - pt was noted to "journal" in her memory notebook re: some of her recent concerns following last sessions education, which is evidence that generalization is occurring from session to session. Demonstrates some emergent awareness given Sup to Min A. Pt appreciative of education and reinforcement. At the request of pt, pt returned to bed at the end of today's session due to back pain and discomfort, with sitting in TIS, per safety plan.  Pt left in room and in bed with all safety measures activated, call bell within reach, and all immediate needs met. Continue per current ST POC.  Pain Pain reported in back at the end of the session - returned to bed at pt's  request.  Therapy/Group: Individual Therapy  Babetta Paterson A Rael Yo 09/30/2022, 1:06 PM

## 2022-10-01 NOTE — Progress Notes (Signed)
Physical Therapy Note  Patient Details  Name: [name] MRN: [id number] Date of Birth: 1956-02-04 Today's Date: 10/01/2022    Pt refusing PT session without specified reason.   Linard Millers 10/01/2022, 3:25 PM

## 2022-10-01 NOTE — Progress Notes (Signed)
Refusal, no reason

## 2022-10-01 NOTE — Progress Notes (Signed)
VAST Consulted to assess PICC because patient complains of discomfort at site. All three lumens flushed well but none received blood return. Patient indicated area of tenderness distal to and around insertion site. There was no redness, swelling, bruising, drainage, or any other indication of problem noted visually or on palpation. Patient states antibiotics completed, and asking for removal of line.   No other indications for central access noted, so [employer] notified via telephone and gives verbal order for PICC removal at this time.   PICC line removed from site prepped per protocol. PICC catheter tip visualized and intact. Pressure dressing applied. No complications noted at site. Patient given instructions for post-removal and verbalizes understanding.

## 2022-10-01 NOTE — Plan of Care (Signed)
Goals upgraded d/t pt progress with standing and transfers   Problem: Sit to Stand Goal: LTG:  Patient will perform sit to stand in prep for activites of daily living with assistance level (OT) Description: LTG:  Patient will perform sit to stand in prep for activites of daily living with assistance level (OT) Flowsheets (Taken 10/01/2022 1323) LTG: PT will perform sit to stand in prep for activites of daily living with assistance level: (goal upgraded 3/15- SD) Minimal Assistance - Patient > 75%   Problem: RH Toilet Transfers Goal: LTG Patient will perform toilet transfers w/assist (OT) Description: LTG: Patient will perform toilet transfers with assist, with/without cues using equipment (OT) Flowsheets (Taken 10/01/2022 1323) LTG: Pt will perform toilet transfers with assistance level of: (goal upgraded 3/15- SD) Minimal Assistance - Patient > 75%

## 2022-10-01 NOTE — Progress Notes (Signed)
Occupational Therapy Weekly Progress Note  Patient Details  Name: [name] MRN: [id number] Date of Birth: [birth year]  Beginning of progress report period: September 24, 2022 End of progress report period: March 15,2024  Patient has met 2 of 3 short term goals.  [name] continues to progress toward her OT goals and is making significant caregiver-burden-reducing gains. She requires (S) with UB ADLs and mod A with LB. She is able to stand consistently with mod A and transfer with a RW with mod A. She is able to tolerate being OOB for over 2 hours now and overall pain is much more managed. Family will hire private care at discharge. Pt has been extended a week to reflect large functional gains being made and hopeful continued improvement at intense Cir level.   Patient continues to demonstrate the following deficits: muscle weakness, decreased cardiorespiratoy endurance, decreased coordination, decreased initiation, decreased problem solving, and decreased memory, and decreased standing balance, decreased postural control, and decreased balance strategies and therefore will continue to benefit from skilled OT intervention to enhance overall performance with BADL and Reduce care partner burden.  Patient progressing toward long term goals..  Continue plan of care.  OT Short Term Goals Week 4:  OT Short Term Goal 1 (Week 4): Pt will complete stand pivot consistently with mod A OT Short Term Goal 1 - Progress (Week 4): Met OT Short Term Goal 2 (Week 4): Pt will complete sit > stand with mod A consistently OT Short Term Goal 2 - Progress (Week 4): Progressing toward goal OT Short Term Goal 3 (Week 4): Pt will stand statically with BUE support and no more than min A OT Short Term Goal 3 - Progress (Week 4): Met Week 5:  OT Short Term Goal 1 (Week 5): Pt will complete a stand pivot transfer with min A OT Short Term Goal 2 (Week 5): Pt will complete toileting tasks with mod A OT Short Term Goal 3  (Week 5): Pt will complete sit > stand with min A   Curtis Sites 10/01/2022, 6:46 AM

## 2022-10-01 NOTE — Progress Notes (Signed)
Occupational Therapy Note  Patient Details  Name: [name] MRN: [id number] Date of Birth: 1956-02-14  Today's Date: 10/01/2022 OT Missed Time: 66 Minutes Missed Time Reason: Patient unwilling/refused to participate without medical reason  Patient reporting very high back pain and general fatigue. Offered multiple options for participation OOB and at bed level, all of which she declined. Pt politely and calmly providing reasoning for refusal, respected patient's wishes and affirmed self-efficacy of her right to decline. 60 min missed.    Curtis Sites 10/01/2022, 1:17 PM

## 2022-10-01 NOTE — Progress Notes (Signed)
Physical Therapy Session Note  Patient Details  Name: [name] MRN: [id number] Date of Birth: 1955-08-17  Today's Date: 10/01/2022 PT Individual Time: [id number] and [id number] PT Individual Time Calculation (min): 42 min and 28 min  Short Term Goals: Week 3:  PT Short Term Goal 1 (Week 3): Pt will complete bed mobility with modA consistently. PT Short Term Goal 2 (Week 3): Pt will completes bed to chair transfers iwth modA consistently. PT Short Term Goal 3 (Week 3): Pt will ambulate x10' with modA +2 and LRAD.  Skilled Therapeutic Interventions/Progress Updates:     1st Session: Pt received supine in bed and agrees to therapy. Reports back pain. PT provides rest breaks and repositioning to manage pain. PT threads pants over pt's legs and pt performs bridging with PT stabilizing feet to assist with pulling pants up over hips. Pt requires modA for supine to sit with cues for sequencing. Sit to stand and stand step transfer to [facility] with modA and PT facilitating hip extension and lateral weight shifting. WC transport to gym. Pt performs sit to stand with RW and CGA, then ambulates x33' with minA +1 and +2 WC follow for safety. Following extended seated rest break, pt ambulates additional x10' with same assistance. Pt left seated in WC with all needs within reach.   2nd Session: Pt received supine in bed and agrees to therapy. Reports back pain. PT provides rest breaks and repositioning to manage pain. Pt encouraged to perform bed mobility to improve independence with supine to sit. Pt able to perform supine to sit from slightly elevated HOB with minA and cues for body mechanics and sequencing. Pt sits at EOB and performs x10 partial side lying to sitting transitions on R and on L, with cues for posture and body mechanics. Pt performs sit to supine with modA, then able to complete supine to sit with supervision and cues for sequencing (!!). Pt performs sit to stand with modA and sidesteps toward  [facility] with cues for posture and positioning. Pt left supine with all needs within reach.   Therapy Documentation Precautions:  Precautions Precautions: Fall, Back Precaution Comments: trach, rectal tube, foley Required Braces or Orthoses: Other Brace Other Brace: prevlon boots Restrictions Weight Bearing Restrictions: No Other Position/Activity Restrictions: external rotation R hip at baseline at all times, RLE shorter than LLE, pt ambulated household distances PTA, w/c for community   Therapy/Group: Individual Therapy  Breck Coons, PT, DPT 10/01/2022, 4:53 PM

## 2022-10-01 NOTE — Progress Notes (Addendum)
PROGRESS NOTE   Subjective/Complaints:  Pt is in better spirits. IV abx end today. Pt was active with therapy yesterday. Pain seems adequately controlled today. Didn't sleep quite as well last night.   ROS: Patient denies fever, rash, sore throat, blurred vision, dizziness, nausea, vomiting, diarrhea, cough, shortness of breath or chest pain,   headache, or mood change.   Objective:   MR BRAIN W WO CONTRAST  Result Date: 09/29/2022 CLINICAL DATA:  Follow-up ventriculitis. EXAM: MRI HEAD WITHOUT AND WITH CONTRAST TECHNIQUE: Multiplanar, multiecho pulse sequences of the brain and surrounding structures were obtained without and with intravenous contrast. CONTRAST:  36mL GADAVIST GADOBUTROL 1 MMOL/ML IV SOLN COMPARISON:  08/31/2022 FINDINGS: Brain: Resolved debris by diffusion and FLAIR imaging at the level of the ventricles. Allowing for motion artifact no sulcal debris is seen either. Resolved ependymal enhancement. Chronic small vessel ischemia in the cerebral white matter and to a greater extent in the pons. No acute or subacute infarct, hydrocephalus, or collection. Vascular: Mid major flow voids and vascular enhancements are preserved Skull and upper cervical spine: Normal marrow signal Sinuses/Orbits: Unremarkable Other: Intermittent pervasive motion artifact IMPRESSION: Resolved ventriculitis findings.  No new abnormality. Motion artifact Electronically Signed   By: Jorje Guild M.D.   On: 09/29/2022 11:16   No results for input(s): "WBC", "HGB", "HCT", "PLT" in the last 72 hours.     Recent Labs    09/30/22 0821  NA 130*  K 4.0  CL 98  CO2 23  GLUCOSE 176*  BUN 7*  CREATININE 0.38*  CALCIUM 9.1     Intake/Output Summary (Last 24 hours) at 10/01/2022 0917 Last data filed at 10/01/2022 0553 Gross per 24 hour  Intake --  Output 2950 ml  Net -2950 ml        Physical Exam: Vital Signs Blood pressure 119/68, pulse  83, temperature 98.2 F (36.8 C), resp. rate 16, height 5\' 1"  (1.549 m), weight 65.2 kg, SpO2 97 %.   Constitutional: No distress . Vital signs reviewed. HEENT: NCAT, EOMI, oral membranes moist Neck: supple Cardiovascular: RRR without murmur. No JVD    Respiratory/Chest: CTA Bilaterally without wheezes or rales. Normal effort    GI/Abdomen: BS +, non-tender, non-distended Ext: no clubbing, cyanosis, or edema Psych: pleasant and cooperative  Skin: upperr back wound just dressed. Did not re-open dressing today. Lower back wound covered. Neurological: Follows commands, some STM deficits. Decreased insight still Alert and oriented x3, no hypertonia noted, CN 2-12 grossly intact. Motor 4/4 UE and 3-4/5 LE prox to distal--stable to improved MSK: LB  remains tender. Tenderness R chest wall, mid-low back    Assessment/Plan: 1. Functional deficits which require 3+ hours per day of interdisciplinary therapy in a comprehensive inpatient rehab setting. Physiatrist is providing close team supervision and 24 hour management of active medical problems listed below. Physiatrist and rehab team continue to assess barriers to discharge/monitor patient progress toward functional and medical goals  Care Tool:  Bathing  Bathing activity did not occur: Refused           Bathing assist       Upper Body Dressing/Undressing Upper body dressing   What is the  patient wearing?: Hospital gown only    Upper body assist Assist Level: Total Assistance - Patient < 25%    Lower Body Dressing/Undressing Lower body dressing      What is the patient wearing?: Hospital gown only     Lower body assist Assist for lower body dressing: Total Assistance - Patient < 25%     Toileting Toileting Toileting Activity did not occur Landscape architect and hygiene only): N/A (no void or bm) (using catheter and rectal tube)  Toileting assist Assist for toileting: Dependent - Patient 0%      Transfers Chair/bed transfer  Transfers assist  Chair/bed transfer activity did not occur: Safety/medical concerns  Chair/bed transfer assist level: Maximal Assistance - Patient 25 - 49%     Locomotion Ambulation   Ambulation assist   Ambulation activity did not occur: Safety/medical concerns          Walk 10 feet activity   Assist  Walk 10 feet activity did not occur: Safety/medical concerns        Walk 50 feet activity   Assist Walk 50 feet with 2 turns activity did not occur: Safety/medical concerns         Walk 150 feet activity   Assist Walk 150 feet activity did not occur: Safety/medical concerns         Walk 10 feet on uneven surface  activity   Assist Walk 10 feet on uneven surfaces activity did not occur: Safety/medical concerns         Wheelchair     Assist Is the patient using a wheelchair?: Yes Type of Wheelchair: Manual Wheelchair activity did not occur: Safety/medical concerns         Wheelchair 50 feet with 2 turns activity    Assist    Wheelchair 50 feet with 2 turns activity did not occur: Safety/medical concerns       Wheelchair 150 feet activity     Assist  Wheelchair 150 feet activity did not occur: Safety/medical concerns       Blood pressure 119/68, pulse 83, temperature 98.2 F (36.8 C), resp. rate 16, height 5\' 1"  (1.549 m), weight 65.2 kg, SpO2 97 %.  Medical Problem List and Plan: 1. Functional deficits secondary to osteomyelitis, infection of lumbar surgical site complicated by meningitis/ventriculitis.             -pt also with significant deconditioning             -patient may not yet shower             -ELOS/Goals: I assume at this point she's ok with extension to 3/27. Will proceed with that ELOS in mind.  Min/Mod A goals with PT, OT, SLP  -Continue CIR therapies including PT, OT, and SLP     2.  Antithrombotics: -DVT/anticoagulation:  Pharmaceutical: Lovenox 40mg  QD              -antiplatelet therapy: N/a  3. Pain Management: Butrans 29mcg/hr patch for pain control w/ Flexeril 5mg  TID, Tylenol PRN, Oxycodone 7.5mg  q4h PRN, Gabapentin 100mg  TID  -2/26 tylenol PRN for HA -2/28 Fioricet ordered PRN headache -09/17/22 will change PRN oxycodone 2.5mg  to percocet 5-325mg , consider increase gabapentin to help with pain and HA- make changes gradually due to hx of encephalopathy  -3/7 Voltaren gel for L shoulder pain -3/8   lidocaine for chest wall tenderness started -3/15- continue flexeril  10mg  tid -She's using oxycodone 5mg  3-4x daily, butrans ptch 74mcg/hr per week as  well as gabapentin tid and tylenol and butalbital prn   4. Mood/Behavior/Sleep: LCSW to follow for evaluation and support.             --Trazodone 100mg  QHS and melatonin 5mg  QHS PRN for  insomnia/mood  -09/04/22 placed sign on door to minimize interruptions 11p-5a; changed CBG checks as below to minimize interruptions             -antipsychotic agents:  Luvox 100mg  QHS and Lamictal 200mg  QD  -Continue Aricept 10mg  QD  -Seen by neuropsych-appreciate assistance  -3/1 increase melatonin to 10mg  dose QHS PRN  -3/4 decrease melatonin back to 5mg  dose QHS PRN -3/11 melatonin 5mg  working reasonably well  -3/15 seems to be dealing with extended LOS a bit better today   -team to continue to provide ego support  5. Neuropsych/cognition: This patient is not fully capable of making decisions on her own behalf.  -reviewed cognitive recovery with patient this week. May take until this summer to plateau. Had some mild memory deficits PTA as well.  6. Skin/Wound Care: WOC to follow for wound VAC changes --MASD managed by fecal system-- flexiseal clogged 09/04/22 AM but nursing states it's now working; monitor--might be able to d/c soon? -2/21 WOC stopped VAC, start aquacel and dressing -see flexiseal discussion below, #17 -2/27 WOC following, slough pulled away revealing 2cm deep defect -2/29 Spoke with Dr. Zada Finders  today about wound depth, continue local wound care- continue to monitor, continue abx per ID,  no plan for procedure at this time -3/4 Seen by [place]- continue aquacel, abd pad/tape, apprecaite assistance -3/7 WOC rec medihoney, fill wound with moistened gauza, distal portion of wound to be backed with silver hydrofiber, appreciate assistance -3/8 Dr. Zada Finders contacted about wound, feels like this is doing well overall, continue current wound care orders, appreciate assistance -3/14-15 changed dressing to collagenase to see if we can get better debridement of fibronecrotic tissue. Small area really needs a bit of debridement  -I contacted NS re: f/u wound next week  7. Fluids/Electrolytes/Nutrition: Monitor I/O. Continue tube feeds-->likely contributing to diarrhea             -monitor intake closely, continue vitamins -2/19 discontinue core track, Consult dietician -2/27 decrease BMP to every other day -3/14--change labs to weekly -3/15 may keep PICC for weekend and until Monday AM lab draws. If all remains stable, labs ok, then we can remove PICC 8.Sepsis w/ osteomyelitis/meningitis/encephalitis: Continue Cefepime 2g q8h, Vanc 1250mg  q12h, and Flagyl 500mg  q8h for at least 6 weeks --  Vanc+Cefepime complete. metronidazole has been discontinued as of 09/16/22 (was primarily for PNA, no longer needed) -3/13 --reviewed repeat MRI of brain with resolution! Spoke with NS -3/15 converting to levaquin 750mg  QD and doxycycline 100mg  BID today  -f/u with Dr. [name] scheduled for 3/25--since she'll be here, he or partner can see while in rehab.    9. H/o PVT:  Monitor BP/heart rate TID--on coreg 12.5mg  BID>>up to 25mg  BID now, Labetalol 10mg  q2h PRN  -09/04/22 BP/HR fairly well controlled, cont regimen, monitor  -2/23  BP has been a little above goal, increase coreg dose to 25mg  BID  -09/25/22 BP/HR with fair control, continue to monitor -3/15 bp/hr controlled     10/01/2022    5:44 AM  10/01/2022    5:00 AM 09/30/2022    8:09 PM  Vitals with BMI  Weight  143 lbs 12 oz   BMI  99991111   Systolic [id number]  0000000  Diastolic 68  58  Pulse 83  98      10. Urinary retention: Has failed 4 voiding trails. Has had foley since 12/23             -maintain foley cath, foley mgt per protocol -- Continue Flomax 0.4mg  qPM. Will d/c urecholine.   11. VDRF s/p trach: Tolerating PMSV well -I ordered downsizing to CFS # 4 today--tolerated well -continue ATC w/ pulmonary hygiene -begin day time plugging of trach tomorrow. Decannulate soon. -Trial of capping with respiratory-  can trial 24 hour capping today -2/22 Leave trach uncapped tonight -2/28 plan for decannulation today, has had trach capped for >72hours -2/29 Lurline Idol was  removed  -doesn't need bandaid anymore on trach site   12. T2DM: Monitor BS ac/hs and use SSI for elevated BS.               --continue liberalized diet for now.   -3/15 no longer checking cbgs per pt/family request although am serum glucose was elevated yesterday-- - resumed metformin 500mg   bid  -consider changing to CM diet at some point -f/u serum glucose Monday   13. Hypokalemia: Recurrent likely due to diarrhea/intake.              --09/18/22 stable K+ 4.2 on 09/17/22, wants to change to BID dosing so switched Klor to 75mEq BID (still will have 69mEq total/day)  -3/12 4.3 yesterday  14. Anemia of critical illness: Hgb 7.5-8.9 range -stable at 9.9---f/u 3/18    Latest Ref Rng & Units 09/27/2022    5:28 AM 09/20/2022    3:44 AM 09/13/2022    3:40 AM  CBC  WBC 4.0 - 10.5 K/uL 8.0  7.3  7.0   Hemoglobin 12.0 - 15.0 g/dL 9.9  10.1  10.0   Hematocrit 36.0 - 46.0 % 31.6  31.7  30.8   Platelets 150 - 400 K/uL 411  419  406      15. Hyponatremia/hypochloremia: I -09/04/22 Na 129, Cl 95, both stable, continue salt tabs 1g BID -Cortisol testing done during hospitalization (13.8 on 08/30/22), was normal, but daughter and pt want to make sure this is followed up--2/21  129, a little decreased, fluid restriction started -2/28 stable at 131 -09/21/22 Na stable at 130, continue to follow with  q48h BMPs- adjust to 8am labs Fluid restrictions liberalized to 1554ml -3/11 Na stable at 130, monitor on weekly labs -3/15 will relax to 1800 cc  -recheck sodium 3/18  16. Encephalopathy/Delirium: improved substantially  17. Loose stools likely d/t TF and abx.            -recent rectal tube, since dc'ed  -generally improved -09/30/22 LBM --mushy but formed  18. Hypothyroidism: continue synthroid 20mcg QD  19. Chronic combined CHF, BLE Swelling: recent 2D echo done 08/20/2022 showed apical hypokinesis, LVEF 40-45%, RV function was reduced. Intermittently diuresed during hospitalization, most recently given 40mg  IV lasix on 09/03/22 -09/04/22 noted to still have BLE swelling, start 20mg  PO lasix and monitor lytes/BP/swelling -3/5 wt a little up, monitor tend -3/6 asked nutrition to stop milk/carnation- she wasn't drinking but it was counting toward her fluid restriction -3/7 wt stable, fluid restrictions liberalized to 1546ml -09/25/22 wt stable, monitor -10/01/22 weight stable to decreased. Not sure it's real although po intake has been sporadic Filed Weights   09/29/22 0634 09/30/22 0500 10/01/22 0500  Weight: 67.1 kg 64.8 kg 65.2 kg    20. Oral ulcer  --2/28 Orajel for oral ulcer discomfort  21.  Dry mucous membranes, may be related to anticholinergic meds cyclobenzaprine and trazodone, pain and sleep are   better so reluctant to stop at this time     LOS: 28 days A FACE TO [place] 10/01/2022, 9:17 AM

## 2022-10-02 MED ORDER — TRAMADOL HCL 50 MG PO TABS: 25.0000 mg | ORAL_TABLET | Freq: Three times a day (TID) | ORAL | Status: DC | PRN

## 2022-10-02 MED ORDER — LIDOCAINE 5 % EX PTCH
2.0000 | MEDICATED_PATCH | CUTANEOUS | Status: DC
  Administered 2022-10-03 – 2022-10-09 (×4): 2 via TRANSDERMAL
  Filled 2022-10-02 (×8): qty 2

## 2022-10-02 NOTE — Progress Notes (Signed)
Dressing on the back changed per orders. Pt tolerated it well with no complaints

## 2022-10-02 NOTE — Progress Notes (Signed)
PROGRESS NOTE   Subjective/Complaints:  Doing well, slept well last night. Pain is ok but having some breakthrough pain sometimes, usually with activity.  LBM was this morning.  Foley still in place, no issues there.  Otherwise no complaints or concerns.   ROS: +back pain. Denies fevers, chills, CP, SOB, abd pain, N/V/D/C, new/worsening paresthesias/weakness, or any other complaints at this time.     Objective:   No results found. No results for input(s): "WBC", "HGB", "HCT", "PLT" in the last 72 hours.     Recent Labs    09/30/22 0821  NA 130*  K 4.0  CL 98  CO2 23  GLUCOSE 176*  BUN 7*  CREATININE 0.38*  CALCIUM 9.1     Intake/Output Summary (Last 24 hours) at 10/02/2022 1559 Last data filed at 10/02/2022 1234 Gross per 24 hour  Intake 0 ml  Output 2050 ml  Net -2050 ml        Physical Exam: Vital Signs Blood pressure 116/69, pulse 92, temperature (!) 97.5 F (36.4 C), resp. rate 17, height 5\' 1"  (1.549 m), weight 66.9 kg, SpO2 99 %.   Constitutional:  thin, NAD, lying in bed HENT: [place]/AT,  MMM today  Eyes: EOMI, conjunctiva clear, PERRL Neck:     Comments: Trach site well healed Cardiovascular: RRR, soft murmur heard (VSD), no BLE swelling noted this morning Pulmonary: CTAB no w/r/r, no increased WOB Abdominal: Soft, nontender, nondistended, positive bowel sounds/normal active Genitourinary:    Comments:Sacral wound dressing clean-not assessed this morning  foley in place-yellow urine Ext: no clubbing, cyanosis, or edema Psych: pleasant and cooperative, happy Skin: upperr back wound just dressed. Did not re-open dressing today. Lower back wound covered.  PRIOR EXAM: Neurological: Follows commands, some STM deficits. Decreased insight still Alert and oriented x3, no hypertonia noted, CN 2-12 grossly intact. Motor 4/4 UE and 3-4/5 LE prox to distal--stable to improved MSK: LB  remains  tender. Tenderness R chest wall, mid-low back    Assessment/Plan: 1. Functional deficits which require 3+ hours per day of interdisciplinary therapy in a comprehensive inpatient rehab setting. Physiatrist is providing close team supervision and 24 hour management of active medical problems listed below. Physiatrist and rehab team continue to assess barriers to discharge/monitor patient progress toward functional and medical goals  Care Tool:  Bathing  Bathing activity did not occur: Refused           Bathing assist       Upper Body Dressing/Undressing Upper body dressing   What is the patient wearing?: Hospital gown only    Upper body assist Assist Level: Total Assistance - Patient < 25%    Lower Body Dressing/Undressing Lower body dressing      What is the patient wearing?: Hospital gown only     Lower body assist Assist for lower body dressing: Total Assistance - Patient < 25%     Toileting Toileting Toileting Activity did not occur Landscape architect and hygiene only): N/A (no void or bm) (using catheter and rectal tube)  Toileting assist Assist for toileting: Dependent - Patient 0%     Transfers Chair/bed transfer  Transfers assist  Chair/bed transfer activity did not  occur: Safety/medical concerns  Chair/bed transfer assist level: Maximal Assistance - Patient 25 - 49%     Locomotion Ambulation   Ambulation assist   Ambulation activity did not occur: Safety/medical concerns          Walk 10 feet activity   Assist  Walk 10 feet activity did not occur: Safety/medical concerns        Walk 50 feet activity   Assist Walk 50 feet with 2 turns activity did not occur: Safety/medical concerns         Walk 150 feet activity   Assist Walk 150 feet activity did not occur: Safety/medical concerns         Walk 10 feet on uneven surface  activity   Assist Walk 10 feet on uneven surfaces activity did not occur: Safety/medical  concerns         Wheelchair     Assist Is the patient using a wheelchair?: Yes Type of Wheelchair: Manual Wheelchair activity did not occur: Safety/medical concerns         Wheelchair 50 feet with 2 turns activity    Assist    Wheelchair 50 feet with 2 turns activity did not occur: Safety/medical concerns       Wheelchair 150 feet activity     Assist  Wheelchair 150 feet activity did not occur: Safety/medical concerns       Blood pressure 116/69, pulse 92, temperature (!) 97.5 F (36.4 C), resp. rate 17, height 5\' 1"  (1.549 m), weight 66.9 kg, SpO2 99 %.  Medical Problem List and Plan: 1. Functional deficits secondary to osteomyelitis, infection of lumbar surgical site complicated by meningitis/ventriculitis.             -pt also with significant deconditioning             -patient may not yet shower -ELOS/Goals: Iextended to 3/27, pt agreeable. Will proceed with that ELOS in mind.  Min/Mod A goals with PT, OT, SLP  -Continue CIR therapies including PT, OT, and SLP     2.  Antithrombotics: -DVT/anticoagulation:  Pharmaceutical: Lovenox 40mg  QD             -antiplatelet therapy: N/a  3. Pain Management: Butrans 43mcg/hr patch for pain control w/ Flexeril 5mg  TID, Tylenol PRN, Oxycodone 7.5mg  q4h PRN, Gabapentin 100mg  TID  -2/26 tylenol PRN for HA -2/28 Fioricet ordered PRN headache -09/17/22 will change PRN oxycodone 2.5mg  to percocet 5-325mg , consider increase gabapentin to help with pain and HA- make changes gradually due to hx of encephalopathy  -3/7 Voltaren gel for L shoulder pain -3/8   lidocaine for chest wall tenderness started -3/15- continue flexeril  10mg  tid -She's using oxycodone 5mg  3-4x daily, butrans ptch 76mcg/hr per week as well as gabapentin tid and tylenol and butalbital prn -10/02/22 pain overall managed but breakthrough pain happening more and not as well managed, considered Tramadol but this would not be beneficial per pharmacy due to  her butrans patch. Don't want to go up on Oxycodone if we can avoid it; pt agreeable to tracking pain over the weekend, and discussing with weekday team whether longer acting opioid would be more beneficial with shorter acting PRNs; if pain uncontrolled this weekend, may switch. Monitor for now.  -Ordered lidoderm patches x2 for low back to see if we can get relief that way   4. Mood/Behavior/Sleep: LCSW to follow for evaluation and support.             --Trazodone 100mg  QHS  and melatonin 5mg  QHS PRN for  insomnia/mood  -09/04/22 placed sign on door to minimize interruptions 11p-5a; changed CBG checks as below to minimize interruptions             -antipsychotic agents:  Luvox 100mg  QHS and Lamictal 200mg  QD  -Continue Aricept 10mg  QD  -Seen by neuropsych-appreciate assistance  -3/1 increase melatonin to 10mg  dose QHS PRN  -3/4 decrease melatonin back to 5mg  dose QHS PRN -3/11 melatonin 5mg  working reasonably well  -3/15 seems to be dealing with extended LOS a bit better today   -team to continue to provide ego support   -10/02/22 better spirits! [place] with extended LOS  5. Neuropsych/cognition: This patient is not fully capable of making decisions on her own behalf. -reviewed cognitive recovery with patient this week. May take until this summer to plateau. Had some mild memory deficits PTA as well.   6. Skin/Wound Care: WOC to follow for wound VAC changes --MASD managed by fecal system-- flexiseal clogged 09/04/22 AM but nursing states it's now working; monitor--might be able to d/c soon? -2/21 WOC stopped VAC, start aquacel and dressing -see flexiseal discussion below, #17 -2/27 WOC following, slough pulled away revealing 2cm deep defect -2/29 Spoke with Dr. Zada Finders today about wound depth, continue local wound care- continue to monitor, continue abx per ID,  no plan for procedure at this time -3/4 Seen by [place]- continue aquacel, abd pad/tape, apprecaite assistance -3/7 WOC rec  medihoney, fill wound with moistened gauza, distal portion of wound to be backed with silver hydrofiber, appreciate assistance -3/8 Dr. Zada Finders contacted about wound, feels like this is doing well overall, continue current wound care orders, appreciate assistance -3/14-15 changed dressing to collagenase to see if we can get better debridement of fibronecrotic tissue. Small area really needs a bit of debridement  -I contacted NS re: f/u wound next week  7. Fluids/Electrolytes/Nutrition: Monitor I/O. Continue tube feeds-->likely contributing to diarrhea             -monitor intake closely, continue vitamins -2/19 discontinue core track, Consult dietician -2/27 decrease BMP to every other day -3/14--change labs to weekly -3/15 may keep PICC for weekend and until Monday AM lab draws. If all remains stable, labs ok, then we can remove PICC -10/02/22 overnight PICC discomfort, no blood return, pt wanted it removed so PICC team removed it; if further IV needs occur, would need PIV  8.Sepsis w/ osteomyelitis/meningitis/encephalitis: Continue Cefepime 2g q8h, Vanc 1250mg  q12h, and Flagyl 500mg  q8h for at least 6 weeks --  Vanc+Cefepime complete. metronidazole has been discontinued as of 09/16/22 (was primarily for PNA, no longer needed) -3/13 --reviewed repeat MRI of brain with resolution! Spoke with NS -3/15 converting to levaquin 750mg  QD and doxycycline 100mg  BID today -f/u with Dr. [name] scheduled for 3/25--since she'll be here, he or partner can see while in rehab.    9. H/o PVT:  Monitor BP/heart rate TID--on coreg 12.5mg  BID>>up to 25mg  BID as of 09/10/22, Labetalol 10mg  q2h PRN  -09/04/22 BP/HR fairly well controlled, cont regimen, monitor  -2/23  BP has been a little above goal, increase coreg dose to 25mg  BID  -09/25/22 BP/HR with fair control, continue to monitor -09/26/22 BP running high, restarted Losartan 25mg  QHS (previously on 100mg ) -10/02/22 BP/HR controlled     10/02/2022    1:16 PM  10/02/2022    6:39 AM 10/02/2022    5:59 AM  Vitals with BMI  Weight  147 lbs 8 oz  BMI  0000000   Systolic 99991111  [id number]  Diastolic 69  68  Pulse 92  84      10. Urinary retention: Has failed 4 voiding trails. Has had foley since 12/23             -maintain foley cath, foley mgt per protocol -- Continue Flomax 0.4mg  qPM. Will d/c urecholine.  -10/02/22 pt and daughter curious when TOV might occur; hoping to get her stronger and able to get to [facility] better before doing so; discussed that this will be a weekday team discussion, may end up going home with it  11. VDRF s/p trach: Tolerating PMSV well -I ordered downsizing to CFS # 4 today--tolerated well -continue ATC w/ pulmonary hygiene -begin day time plugging of trach tomorrow. Decannulate soon. -Trial of capping with respiratory-  can trial 24 hour capping today -2/22 Leave trach uncapped tonight -2/28 plan for decannulation today, has had trach capped for >72hours -2/29 Lurline Idol was  removed  -doesn't need bandaid anymore on trach site   12. T2DM: Monitor BS ac/hs and use SSI for elevated BS.               --continue liberalized diet for now. -3/15 no longer checking cbgs per pt/family request although am serum glucose was elevated yesterday-- - resumed metformin 500mg   bid  -consider changing to CM diet at some point -f/u serum glucose Monday   13. Hypokalemia: Recurrent likely due to diarrhea/intake.  -09/18/22 stable K+ 4.2 on 09/17/22, wants to change to BID dosing so switched Klor to 72mEq BID (still will have 52mEq total/day)  -3/12 4.3 yesterday  14. Anemia of critical illness: Hgb 7.5-8.9 range -stable at 9.9---f/u 10/04/22    Latest Ref Rng & Units 09/27/2022    5:28 AM 09/20/2022    3:44 AM 09/13/2022    3:40 AM  CBC  WBC 4.0 - 10.5 K/uL 8.0  7.3  7.0   Hemoglobin 12.0 - 15.0 g/dL 9.9  10.1  10.0   Hematocrit 36.0 - 46.0 % 31.6  31.7  30.8   Platelets 150 - 400 K/uL 411  419  406      15. Hyponatremia/hypochloremia:  I -09/04/22 Na 129, Cl 95, both stable, continue salt tabs 1g BID -Cortisol testing done during hospitalization (13.8 on 08/30/22), was normal, but daughter and pt want to make sure this is followed up- defer to weekday team regarding ongoing ?Addison's testing -2/21 129, a little decreased, fluid restriction started -2/28 stable at 131 -09/21/22 Na stable at 130, continue to follow with  q48h BMPs- adjust to 8am labs -Fluid restrictions liberalized to 159ml -3/11 Na stable at 130, monitor on weekly labs -3/15 will relax to 1800 cc  -recheck sodium 10/04/22  16. Encephalopathy/Delirium: improved substantially  17. Loose stools likely d/t TF and abx.            -recent rectal tube, since dc'ed  -generally improved -09/30/22 LBM --mushy but formed  18. Hypothyroidism: continue synthroid 71mcg QD  19. Chronic combined CHF, BLE Swelling: recent 2D echo done 08/20/2022 showed apical hypokinesis, LVEF 40-45%, RV function was reduced. Intermittently diuresed during hospitalization, most recently given 40mg  IV lasix on 09/03/22 -09/04/22 noted to still have BLE swelling, start 20mg  PO lasix and monitor lytes/BP/swelling -3/5 wt a little up, monitor tend -3/6 asked nutrition to stop milk/carnation- she wasn't drinking but it was counting toward her fluid restriction -3/7 wt stable, fluid restrictions liberalized to 1549ml -09/25/22 wt stable, monitor -10/01/22  weight stable to decreased. Not sure it's real although po intake has been sporadic -10/02/22 wt uptrending but stable. Monitor  Filed Weights   09/30/22 0500 10/01/22 0500 10/02/22 0639  Weight: 64.8 kg 65.2 kg 66.9 kg    20. Oral ulcer  --2/28 Orajel for oral ulcer discomfort 21.  Dry mucous membranes, may be related to anticholinergic meds cyclobenzaprine and trazodone, pain and sleep are   better so reluctant to stop at this time     LOS: 29 days A FACE TO [place] 10/02/2022, 3:59 PM

## 2022-10-03 DIAGNOSIS — R110 Nausea: Secondary | ICD-10-CM

## 2022-10-03 NOTE — Progress Notes (Signed)
PROGRESS NOTE   Subjective/Complaints:  Pt doing alright today, she doesn't eat breakfast and took her meds on an empty stomach so now she's nauseated-- this happens often.  Slept alright once she fell asleep, but did have some difficulty falling asleep initially because she was using her cell phone last night-- once she was asleep, she slept great though.  Pain seems better controlled today, feels that when she gets the PRN percocet closer to the scheduled oxycodone, it works better. Will try writing down times so she can manage it better. Will discuss with weekday team whether any adjustments can be made otherwise.  LBM was yesterday, formed, no diarrhea.  Foley still in place, no issues there.  Otherwise no complaints or concerns.   ROS: +back pain-managed, +nausea. Denies fevers, chills, CP, SOB, abd pain, V/D/C, new/worsening paresthesias/weakness, or any other complaints at this time.     Objective:   No results found. No results for input(s): "WBC", "HGB", "HCT", "PLT" in the last 72 hours.     No results for input(s): "NA", "K", "CL", "CO2", "GLUCOSE", "BUN", "CREATININE", "CALCIUM" in the last 72 hours.   Intake/Output Summary (Last 24 hours) at 10/03/2022 1323 Last data filed at 10/03/2022 1120 Gross per 24 hour  Intake 240 ml  Output 800 ml  Net -560 ml        Physical Exam: Vital Signs Blood pressure 121/71, pulse 81, temperature 97.9 F (36.6 C), temperature source Oral, resp. rate 18, height 5\' 1"  (1.549 m), weight 65.8 kg, SpO2 97 %.   Constitutional:  thin, NAD, lying in bed, dry heaved at one point but then recovered HENT: [place]/AT,  MMM today  Eyes: EOMI, conjunctiva clear, PERRL Neck: Trach site well healed Cardiovascular: RRR, soft murmur heard (VSD), no pedal edema Pulmonary: CTAB no w/r/r, no increased WOB Abdominal: Soft, nontender, nondistended, positive bowel sounds/normal  active Genitourinary:    Comments:Sacral wound dressing clean-not assessed this morning  foley in place-yellow urine Ext: no clubbing, cyanosis, or edema Psych: pleasant and cooperative, happy Skin: upper back wound just dressed. Did not re-open dressing today. Lower back wound covered.  PRIOR EXAM: Neurological: Follows commands, some STM deficits. Decreased insight still Alert and oriented x3, no hypertonia noted, CN 2-12 grossly intact. Motor 4/4 UE and 3-4/5 LE prox to distal--stable to improved MSK: LB  remains tender. Tenderness R chest wall, mid-low back    Assessment/Plan: 1. Functional deficits which require 3+ hours per day of interdisciplinary therapy in a comprehensive inpatient rehab setting. Physiatrist is providing close team supervision and 24 hour management of active medical problems listed below. Physiatrist and rehab team continue to assess barriers to discharge/monitor patient progress toward functional and medical goals  Care Tool:  Bathing  Bathing activity did not occur: Refused           Bathing assist       Upper Body Dressing/Undressing Upper body dressing   What is the patient wearing?: Hospital gown only    Upper body assist Assist Level: Total Assistance - Patient < 25%    Lower Body Dressing/Undressing Lower body dressing      What is the patient wearing?: Hospital gown  only     Lower body assist Assist for lower body dressing: Total Assistance - Patient < 25%     Toileting Toileting Toileting Activity did not occur Landscape architect and hygiene only): N/A (no void or bm) (using catheter and rectal tube)  Toileting assist Assist for toileting: Dependent - Patient 0%     Transfers Chair/bed transfer  Transfers assist  Chair/bed transfer activity did not occur: Safety/medical concerns  Chair/bed transfer assist level: Maximal Assistance - Patient 25 - 49%     Locomotion Ambulation   Ambulation assist   Ambulation  activity did not occur: Safety/medical concerns          Walk 10 feet activity   Assist  Walk 10 feet activity did not occur: Safety/medical concerns        Walk 50 feet activity   Assist Walk 50 feet with 2 turns activity did not occur: Safety/medical concerns         Walk 150 feet activity   Assist Walk 150 feet activity did not occur: Safety/medical concerns         Walk 10 feet on uneven surface  activity   Assist Walk 10 feet on uneven surfaces activity did not occur: Safety/medical concerns         Wheelchair     Assist Is the patient using a wheelchair?: Yes Type of Wheelchair: Manual Wheelchair activity did not occur: Safety/medical concerns         Wheelchair 50 feet with 2 turns activity    Assist    Wheelchair 50 feet with 2 turns activity did not occur: Safety/medical concerns       Wheelchair 150 feet activity     Assist  Wheelchair 150 feet activity did not occur: Safety/medical concerns       Blood pressure 121/71, pulse 81, temperature 97.9 F (36.6 C), temperature source Oral, resp. rate 18, height 5\' 1"  (1.549 m), weight 65.8 kg, SpO2 97 %.  Medical Problem List and Plan: 1. Functional deficits secondary to osteomyelitis, infection of lumbar surgical site complicated by meningitis/ventriculitis.             -pt also with significant deconditioning             -patient may not yet shower -ELOS/Goals: extended to 3/27, pt agreeable. Will proceed with that ELOS in mind.  Min/Mod A goals with PT, OT, SLP  -Continue CIR therapies including PT, OT, and SLP     2.  Antithrombotics: -DVT/anticoagulation:  Pharmaceutical: Lovenox 40mg  QD             -antiplatelet therapy: N/a  3. Pain Management: Butrans 20mcg/hr patch for pain control w/ Flexeril 5mg  TID, Tylenol PRN, Oxycodone 7.5mg  q4h PRN, Gabapentin 100mg  TID  -2/26 tylenol PRN for HA -2/28 Fioricet ordered PRN headache -09/17/22 will change PRN oxycodone  2.5mg  to percocet 5-325mg , consider increase gabapentin to help with pain and HA- make changes gradually due to hx of encephalopathy  -3/7 Voltaren gel for L shoulder pain -3/8   lidocaine for chest wall tenderness started -3/15- continue flexeril  10mg  tid -She's using oxycodone 5mg  3-4x daily, butrans ptch 56mcg/hr per week as well as gabapentin tid and tylenol and butalbital prn -10/02/22 pain overall managed but breakthrough pain happening more and not as well managed, considered Tramadol but this would not be beneficial per pharmacy due to her butrans patch. Don't want to go up on Oxycodone if we can avoid it; pt agreeable to tracking pain  over the weekend, and discussing with weekday team whether longer acting opioid would be more beneficial with shorter acting PRNs; if pain uncontrolled this weekend, may switch. Monitor for now.  -Ordered lidoderm patches x2 for low back to see if we can get relief that way -10/03/22 pain doing some better, will ask primary team regarding pain regimen but for now she's doing alright.    4. Mood/Behavior/Sleep: LCSW to follow for evaluation and support.             --Trazodone 100mg  QHS and melatonin 5mg  QHS PRN for  insomnia/mood  -09/04/22 placed sign on door to minimize interruptions 11p-5a; changed CBG checks as below to minimize interruptions             -antipsychotic agents:  Luvox 100mg  QHS and Lamictal 200mg  QD  -Continue Aricept 10mg  QD  -Seen by neuropsych-appreciate assistance  -3/1 increase melatonin to 10mg  dose QHS PRN  -3/4 decrease melatonin back to 5mg  dose QHS PRN -3/11 melatonin 5mg  working reasonably well  -3/15 seems to be dealing with extended LOS a bit better today   -team to continue to provide ego support   -10/02/22 better spirits! [place] with extended LOS  5. Neuropsych/cognition: This patient is not fully capable of making decisions on her own behalf. -reviewed cognitive recovery with patient this week. May take until this summer to  plateau. Had some mild memory deficits PTA as well.   6. Skin/Wound Care: WOC to follow for wound VAC changes --MASD managed by fecal system-- flexiseal clogged 09/04/22 AM but nursing states it's now working; monitor--might be able to d/c soon? -2/21 WOC stopped VAC, start aquacel and dressing -see flexiseal discussion below, #17 -2/27 WOC following, slough pulled away revealing 2cm deep defect -2/29 Spoke with Dr. Zada Finders today about wound depth, continue local wound care- continue to monitor, continue abx per ID,  no plan for procedure at this time -3/4 Seen by Slater- continue aquacel, abd pad/tape, apprecaite assistance -3/7 WOC rec medihoney, fill wound with moistened gauza, distal portion of wound to be backed with silver hydrofiber, appreciate assistance -3/8 Dr. Zada Finders contacted about wound, feels like this is doing well overall, continue current wound care orders, appreciate assistance -3/14-15 changed dressing to collagenase to see if we can get better debridement of fibronecrotic tissue. Small area really needs a bit of debridement  -I contacted NS re: f/u wound next week  7. Fluids/Electrolytes/Nutrition: Monitor I/O. Continue tube feeds-->likely contributing to diarrhea             -monitor intake closely, continue vitamins -2/19 discontinue core track, Consult dietician -2/27 decrease BMP to every other day -3/14--change labs to weekly -3/15 may keep PICC for weekend and until Monday AM lab draws. If all remains stable, labs ok, then we can remove PICC -10/02/22 overnight PICC discomfort, no blood return, pt wanted it removed so PICC team removed it; if further IV needs occur, would need PIV  8.Sepsis w/ osteomyelitis/meningitis/encephalitis: Continue Cefepime 2g q8h, Vanc 1250mg  q12h, and Flagyl 500mg  q8h for at least 6 weeks --  Vanc+Cefepime complete. Metronidazole has been discontinued as of 09/16/22 (was primarily for PNA, no longer needed) -3/13 --reviewed repeat  MRI of brain with resolution! Spoke with NS -3/15 converting to levaquin 750mg  QD and doxycycline 100mg  BID today -f/u with Dr. [name] scheduled for 3/25--since she'll be here, he or partner can see while in rehab.    9. H/o PVT:  Monitor BP/heart rate TID--on coreg 12.5mg  BID>>up  to 25mg  BID as of 09/10/22, Labetalol 10mg  q2h PRN  -2/23  BP has been a little above goal, increase coreg dose to 25mg  BID  -09/25/22 BP/HR with fair control, continue to monitor -09/26/22 BP running high, restarted Losartan 25mg  QHS (previously on 100mg ) -3/16-17/24 BP/HR controlled     10/03/2022    6:04 AM 10/03/2022    4:26 AM 10/02/2022    7:41 PM  Vitals with BMI  Weight 145 lbs 1 oz    BMI [id number]    Systolic  [id number]  Diastolic  71 86  Pulse  81 100      10. Urinary retention: Has failed 4 voiding trails. Has had foley since 12/23             -maintain foley cath, foley mgt per protocol -- Continue Flomax 0.4mg  qPM. Will d/c urecholine.  -10/02/22 pt and daughter curious when TOV might occur; hoping to get her stronger and able to get to [facility] better before doing so; discussed that this will be a weekday team discussion, may end up going home with it  11. VDRF s/p trach: Tolerating PMSV well -I ordered downsizing to CFS # 4 today--tolerated well -continue ATC w/ pulmonary hygiene -begin day time plugging of trach tomorrow. Decannulate soon. -Trial of capping with respiratory-  can trial 24 hour capping today -2/22 Leave trach uncapped tonight -2/28 plan for decannulation today, has had trach capped for >72hours -2/29 Lurline Idol was  removed  -doesn't need bandaid anymore on trach site   12. T2DM: Monitor BS ac/hs and use SSI for elevated BS.               --continue liberalized diet for now. -3/15 no longer checking cbgs per pt/family request although am serum glucose was elevated yesterday-- - resumed metformin 500mg   bid  -consider changing to CM diet at some point -f/u serum glucose Monday 10/04/22    13. Hypokalemia: Recurrent likely due to diarrhea/intake.  -09/18/22 stable K+ 4.2 on 09/17/22, wants to change to BID dosing so switched Klor to 37mEq BID (still will have 47mEq total/day)  -3/12 4.3 yesterday, monitor weekly labs next 10/04/22  14. Anemia of critical illness: Hgb 7.5-8.9 range -stable at 9.9---f/u 10/04/22    Latest Ref Rng & Units 09/27/2022    5:28 AM 09/20/2022    3:44 AM 09/13/2022    3:40 AM  CBC  WBC 4.0 - 10.5 K/uL 8.0  7.3  7.0   Hemoglobin 12.0 - 15.0 g/dL 9.9  10.1  10.0   Hematocrit 36.0 - 46.0 % 31.6  31.7  30.8   Platelets 150 - 400 K/uL 411  419  406      15. Hyponatremia/hypochloremia: I -09/04/22 Na 129, Cl 95, both stable, continue salt tabs 1g BID -Cortisol testing done during hospitalization (13.8 on 08/30/22), was normal, but daughter and pt want to make sure this is followed up- defer to weekday team regarding ongoing ?Addison's testing -2/21 129, a little decreased, fluid restriction started -2/28 stable at 131 -09/21/22 Na stable at 130, continue to follow with  q48h BMPs- adjust to 8am labs -Fluid restrictions liberalized to 1568ml -3/11 Na stable at 130, monitor on weekly labs -3/15 will relax to 1800 cc  -recheck sodium 10/04/22  16. Encephalopathy/Delirium: improved substantially  17. Loose stools likely d/t TF and abx.            -recent rectal tube, since dc'ed  -generally improved -09/30/22 LBM --mushy but formed -10/03/22  more formed BMs, LBM 10/02/22, continue monitoring  18. Hypothyroidism: continue synthroid 71mcg QD  19. Chronic combined CHF, BLE Swelling: recent 2D echo done 08/20/2022 showed apical hypokinesis, LVEF 40-45%, RV function was reduced. Intermittently diuresed during hospitalization, most recently given 40mg  IV lasix on 09/03/22 -09/04/22 noted to still have BLE swelling, start 20mg  PO lasix and monitor lytes/BP/swelling -3/5 wt a little up, monitor tend -3/6 asked nutrition to stop milk/carnation- she wasn't drinking but it  was counting toward her fluid restriction -3/7 wt stable, fluid restrictions liberalized to 1538ml -09/25/22 wt stable, monitor -10/01/22 weight stable to decreased. Not sure it's real although po intake has been sporadic -10/02/22 wt uptrending but stable. Monitor  -10/03/22 wt stable, monitor Filed Weights   10/01/22 0500 10/02/22 0639 10/03/22 0604  Weight: 65.2 kg 66.9 kg 65.8 kg    20. Oral ulcer  --2/28 Orajel for oral ulcer discomfort 21.  Dry mucous membranes, may be related to anticholinergic meds cyclobenzaprine and trazodone, pain and sleep are   better so reluctant to stop at this time  22. Nausea: somewhat chronic, takes meds on empty stomach in the morning, compazine given and helped, continue PRN compazine 5-10mg , encouraged pt to have some food before meds     LOS: 30 days A FACE TO [place] 10/03/2022, 1:23 PM

## 2022-10-03 NOTE — Progress Notes (Signed)
Occupational Therapy Session Note  Patient Details  Name: [name] MRN: [id number] Date of Birth: [birth year]  Today's Date: 10/03/2022 OT Individual Time: 1115-1200 OT Individual Time Calculation (min): 45 min    Short Term Goals: Week 4:  OT Short Term Goal 1 (Week 4): Pt will complete stand pivot consistently with mod A OT Short Term Goal 1 - Progress (Week 4): Met OT Short Term Goal 2 (Week 4): Pt will complete sit > stand with mod A consistently OT Short Term Goal 2 - Progress (Week 4): Progressing toward goal OT Short Term Goal 3 (Week 4): Pt will stand statically with BUE support and no more than min A OT Short Term Goal 3 - Progress (Week 4): Met  Skilled Therapeutic Interventions/Progress Updates:    Patient received supine in bed - receiving care from NT.  Patient reports feeling nauseated, but willing to get up for therapy stating - "I will try."  Patient able to roll toward right with bed features, and come partially to sitting.  Patient needing cueing and min assist to transition from sidesit to sitting at edge of bed.  Worked on attempting to put on socks - patient unable to reach foot.  If sock partially on (over forefoot, able to pull over heel. Patient stood without walker with mod/min assist- unable to stand fully.  Patient used the walker and stood with min assist and able to stand step transfer to chair to finish ADL.  Daughter here and assisted with hair care at sink.  Patient left up in wheelchair for lunch - daughter providing care.    Therapy Documentation Precautions:  Precautions Precautions: Fall, Back Precaution Comments: trach, rectal tube, foley Required Braces or Orthoses: Other Brace Other Brace: prevlon boots Restrictions Weight Bearing Restrictions: No Other Position/Activity Restrictions: external rotation R hip at baseline at all times, RLE shorter than LLE, pt ambulated household distances PTA, w/c for community   Pain: Pain  Assessment Pain Score: 5    Therapy/Group: Individual Therapy  Mariah Milling 10/03/2022, 12:10 PM

## 2022-10-03 NOTE — Progress Notes (Signed)
SLP Cancellation Note  Patient Details Name: [name] MRN: [id number] DOB: 1956-03-30   Cancelled treatment:        Attempted to see pt to offer make up session for previously missed therapy minutes; however, pt was off the unit with family.  Will continue efforts as pt is available.                                                                                                  Analisia Kingsford, Selinda Orion 10/03/2022, 1:11 PM

## 2022-10-04 DIAGNOSIS — T847XXS Infection and inflammatory reaction due to other internal orthopedic prosthetic devices, implants and grafts, sequela: Secondary | ICD-10-CM

## 2022-10-04 LAB — CBC
HCT: 31.5 % — ABNORMAL LOW (ref 36.0–46.0)
Hemoglobin: 10.4 g/dL — ABNORMAL LOW (ref 12.0–15.0)
MCH: 30.4 pg (ref 26.0–34.0)
MCHC: 33 g/dL (ref 30.0–36.0)
MCV: 92.1 fL (ref 80.0–100.0)
Platelets: 475 10*3/uL — ABNORMAL HIGH (ref 150–400)
RBC: 3.42 MIL/uL — ABNORMAL LOW (ref 3.87–5.11)
RDW: 15.9 % — ABNORMAL HIGH (ref 11.5–15.5)
WBC: 7.1 10*3/uL (ref 4.0–10.5)
nRBC: 0 % (ref 0.0–0.2)

## 2022-10-04 LAB — BASIC METABOLIC PANEL
Anion gap: 9 (ref 5–15)
BUN: 7 mg/dL — ABNORMAL LOW (ref 8–23)
CO2: 24 mmol/L (ref 22–32)
Calcium: 9.3 mg/dL (ref 8.9–10.3)
Chloride: 96 mmol/L — ABNORMAL LOW (ref 98–111)
Creatinine, Ser: 0.45 mg/dL (ref 0.44–1.00)
GFR, Estimated: >60 mL/min (ref >60)
Glucose, Bld: 104 mg/dL — ABNORMAL HIGH (ref 70–99)
Potassium: 4.1 mmol/L (ref 3.5–5.1)
Sodium: 129 mmol/L — ABNORMAL LOW (ref 135–145)

## 2022-10-04 MED ORDER — BUPRENORPHINE 5 MCG/HR TD PTWK
1.0000 | MEDICATED_PATCH | TRANSDERMAL | Status: DC
  Administered 2022-10-04: 1 via TRANSDERMAL
  Filled 2022-10-04: qty 1

## 2022-10-04 NOTE — Progress Notes (Signed)
Occupational Therapy Session Note  Patient Details  Name: [name] MRN: [id number] Date of Birth: Mar 02, 1956  Today's Date: 10/04/2022 OT Individual Time: 1403-1510 OT Individual Time Calculation (min): 67 min    Short Term Goals:  Week 5:  OT Short Term Goal 1 (Week 5): Pt will complete a stand pivot transfer with min A OT Short Term Goal 2 (Week 5): Pt will complete toileting tasks with mod A OT Short Term Goal 3 (Week 5): Pt will complete sit > stand with min A  Skilled Therapeutic Interventions/Progress Updates:    Pt received supine with no c/o pain, agreeable to OT session. Session focused on discussion, education, and trialing of different wheelchair options for home use. She completed bed mobility to EOB with min A. She stood from EOB with CGA and used the RW to pivot to the w/c with min A, very close to [place] ! She was taken via TIS w/c to the w/c room to provide demonstration of several different w/c options. Problem solved with pt and her daughter through home accessibility, w/c transportation/storage, use of chair, and different customizable options. Presented power w/c as an option d/t pt's several comorbidity that make long term use of a manual w/c unrealistic. Also emphasized that power w/c would be an independent method of navigating home and completing IADLs, not a replacement for the goal of ambulation with caregiver support. Also emphasized benefits of back/seat for her sitting tolerance and pain relief. She completed stand pivot transfer from TIS to power w/c with her daughter transferring ROHO cushion over with min A. She was able to navigate the w/c 20 ft back to her room with min cueing and instruction on use. She transferred back to bed and was left supine with all needs met.   Therapy Documentation Precautions:  Precautions Precautions: Fall, Back Required Braces or Orthoses: Other Brace Other Brace: prevlon boots Restrictions Weight Bearing Restrictions:  No Other Position/Activity Restrictions: external rotation R hip at baseline at all times, RLE shorter than LLE, pt ambulated household distances PTA, w/c for community   Therapy/Group: Individual Therapy  Curtis Sites 10/04/2022, 6:55 AM

## 2022-10-04 NOTE — Progress Notes (Signed)
PROGRESS NOTE   Subjective/Complaints:  Nausea again this morning after taking multiple medications on empty stomach, she will try to spread out medications a little tomorrow to avoid this. Butrans batch has fallen off.  She asks about voiding trial.   ROS: +back pain-managed, +nausea+vomiting . Denies fevers, chills, CP, SOB, abd pain, D/C, new/worsening paresthesias/weakness, or any other complaints at this time.     Objective:   No results found. Recent Labs    10/04/22 0738  WBC 7.1  HGB 10.4*  HCT 31.5*  PLT 475*       No results for input(s): "NA", "K", "CL", "CO2", "GLUCOSE", "BUN", "CREATININE", "CALCIUM" in the last 72 hours.   Intake/Output Summary (Last 24 hours) at 10/04/2022 0811 Last data filed at 10/04/2022 0600 Gross per 24 hour  Intake 240 ml  Output 2400 ml  Net -2160 ml         Physical Exam: Vital Signs Blood pressure (!) 143/84, pulse 88, temperature 97.9 F (36.6 C), temperature source Oral, resp. rate 18, height 5\' 1"  (1.549 m), weight 63.2 kg, SpO2 100 %.   Constitutional:  thin, NAD, lying in bed, dry heaved at one point  HENT: [place]/AT,  MMM today  Eyes: Conjugate gaze conjunctiva clear, PERRL Neck: Trach site well healed Cardiovascular: RRR, soft murmur heard (VSD), no pedal edema Pulmonary: CTAB no w/r/r, no increased WOB Abdominal: Soft, nontender, nondistended, positive bowel sounds/normal active Genitourinary:    Comments:Sacral wound dressing clean-not assessed this morning  foley in place-yellow urine Ext: no clubbing, cyanosis, or edema Psych: pleasant and cooperative Skin: upper back wound just dressed. Did not re-open dressing today. Lower back wound covered.  PRIOR EXAM: Neurological: Follows commands, some STM deficits. Decreased insight still Alert and oriented x3, no hypertonia noted, CN 2-12 grossly intact. Motor 4/4 UE and 3-4/5 LE prox to distal--stable to  improved MSK: LB  remains tender. Tenderness R chest wall, mid-low back    Assessment/Plan: 1. Functional deficits which require 3+ hours per day of interdisciplinary therapy in a comprehensive inpatient rehab setting. Physiatrist is providing close team supervision and 24 hour management of active medical problems listed below. Physiatrist and rehab team continue to assess barriers to discharge/monitor patient progress toward functional and medical goals  Care Tool:  Bathing  Bathing activity did not occur: Refused           Bathing assist       Upper Body Dressing/Undressing Upper body dressing   What is the patient wearing?: Hospital gown only    Upper body assist Assist Level: Total Assistance - Patient < 25%    Lower Body Dressing/Undressing Lower body dressing      What is the patient wearing?: Hospital gown only     Lower body assist Assist for lower body dressing: Total Assistance - Patient < 25%     Toileting Toileting Toileting Activity did not occur Landscape architect and hygiene only): N/A (no void or bm) (using catheter and rectal tube)  Toileting assist Assist for toileting: Dependent - Patient 0%     Transfers Chair/bed transfer  Transfers assist  Chair/bed transfer activity did not occur: Safety/medical concerns  Chair/bed transfer  assist level: Maximal Assistance - Patient 25 - 49%     Locomotion Ambulation   Ambulation assist   Ambulation activity did not occur: Safety/medical concerns          Walk 10 feet activity   Assist  Walk 10 feet activity did not occur: Safety/medical concerns        Walk 50 feet activity   Assist Walk 50 feet with 2 turns activity did not occur: Safety/medical concerns         Walk 150 feet activity   Assist Walk 150 feet activity did not occur: Safety/medical concerns         Walk 10 feet on uneven surface  activity   Assist Walk 10 feet on uneven surfaces activity did not  occur: Safety/medical concerns         Wheelchair     Assist Is the patient using a wheelchair?: Yes Type of Wheelchair: Manual Wheelchair activity did not occur: Safety/medical concerns         Wheelchair 50 feet with 2 turns activity    Assist    Wheelchair 50 feet with 2 turns activity did not occur: Safety/medical concerns       Wheelchair 150 feet activity     Assist  Wheelchair 150 feet activity did not occur: Safety/medical concerns       Blood pressure (!) 143/84, pulse 88, temperature 97.9 F (36.6 C), temperature source Oral, resp. rate 18, height 5\' 1"  (1.549 m), weight 63.2 kg, SpO2 100 %.  Medical Problem List and Plan: 1. Functional deficits secondary to osteomyelitis, infection of lumbar surgical site complicated by meningitis/ventriculitis.             -pt also with significant deconditioning             -patient may not yet shower -ELOS/Goals: extended to 3/27, pt agreeable. Will proceed with that ELOS in mind.  Min/Mod A goals with PT, OT, SLP  -Continue CIR therapies including PT, OT, and SLP     2.  Antithrombotics: -DVT/anticoagulation:  Pharmaceutical: Lovenox 40mg  QD             -antiplatelet therapy: N/a  3. Pain Management: Butrans 43mcg/hr patch for pain control w/ Flexeril 5mg  TID, Tylenol PRN, Oxycodone 7.5mg  q4h PRN, Gabapentin 100mg  TID  -2/26 tylenol PRN for HA -2/28 Fioricet ordered PRN headache -09/17/22 will change PRN oxycodone 2.5mg  to percocet 5-325mg , consider increase gabapentin to help with pain and HA- make changes gradually due to hx of encephalopathy  -3/7 Voltaren gel for L shoulder pain -3/8   lidocaine for chest wall tenderness started -3/15- continue flexeril  10mg  tid -She's using oxycodone 5mg  3-4x daily, butrans ptch 71mcg/hr per week as well as gabapentin tid and tylenol and butalbital prn -10/02/22 pain overall managed but breakthrough pain happening more and not as well managed, considered Tramadol but  this would not be beneficial per pharmacy due to her butrans patch. Don't want to go up on Oxycodone if we can avoid it; pt agreeable to tracking pain over the weekend, and discussing with weekday team whether longer acting opioid would be more beneficial with shorter acting PRNs; if pain uncontrolled this weekend, may switch. Monitor for now.  -Ordered lidoderm patches x2 for low back to see if we can get relief that way -10/03/22 pain doing some better, will ask primary team regarding pain regimen but for now she's doing alright.  -3/18 continue current regimen for now, asked nursing to replace  butrans patch that has fallen off   4. Mood/Behavior/Sleep: LCSW to follow for evaluation and support.             --Trazodone 100mg  QHS and melatonin 5mg  QHS PRN for  insomnia/mood  -09/04/22 placed sign on door to minimize interruptions 11p-5a; changed CBG checks as below to minimize interruptions             -antipsychotic agents:  Luvox 100mg  QHS and Lamictal 200mg  QD  -Continue Aricept 10mg  QD  -Seen by neuropsych-appreciate assistance  -3/1 increase melatonin to 10mg  dose QHS PRN  -3/4 decrease melatonin back to 5mg  dose QHS PRN -3/11 melatonin 5mg  working reasonably well  -3/15 seems to be dealing with extended LOS a bit better today   -team to continue to provide ego support   -10/02/22 better spirits! [place] with extended LOS  5. Neuropsych/cognition: This patient is not fully capable of making decisions on her own behalf. -reviewed cognitive recovery with patient this week. May take until this summer to plateau. Had some mild memory deficits PTA as well.   6. Skin/Wound Care: WOC to follow for wound VAC changes --MASD managed by fecal system-- flexiseal clogged 09/04/22 AM but nursing states it's now working; monitor--might be able to d/c soon? -2/21 WOC stopped VAC, start aquacel and dressing -see flexiseal discussion below, #17 -2/27 WOC following, slough pulled away revealing 2cm deep  defect -2/29 Spoke with Dr. Zada Finders today about wound depth, continue local wound care- continue to monitor, continue abx per ID,  no plan for procedure at this time -3/4 Seen by [place]- continue aquacel, abd pad/tape, apprecaite assistance -3/7 WOC rec medihoney, fill wound with moistened gauza, distal portion of wound to be backed with silver hydrofiber, appreciate assistance -3/8 Dr. Zada Finders contacted about wound, feels like this is doing well overall, continue current wound care orders, appreciate assistance -3/14-15 changed dressing to collagenase to see if we can get better debridement of fibronecrotic tissue. Small area really needs a bit of debridement  -I contacted NS re: f/u wound next week -3/28 seen by [facility] today, continue to monitor  7. Fluids/Electrolytes/Nutrition: Monitor I/O. Continue tube feeds-->likely contributing to diarrhea             -monitor intake closely, continue vitamins -2/19 discontinue core track, Consult dietician -2/27 decrease BMP to every other day -3/14--change labs to weekly -3/15 may keep PICC for weekend and until Monday AM lab draws. If all remains stable, labs ok, then we can remove PICC -10/02/22 overnight PICC discomfort, no blood return, pt wanted it removed so PICC team removed it; if further IV needs occur, would need PIV  8.Sepsis w/ osteomyelitis/meningitis/encephalitis: Continue Cefepime 2g q8h, Vanc 1250mg  q12h, and Flagyl 500mg  q8h for at least 6 weeks --  Vanc+Cefepime complete. Metronidazole has been discontinued as of 09/16/22 (was primarily for PNA, no longer needed) -3/13 --reviewed repeat MRI of brain with resolution! Spoke with NS -3/15 converting to levaquin 750mg  QD and doxycycline 100mg  BID today -f/u with Dr. [name] scheduled for 3/25--since she'll be here, he or partner can see while in rehab.    9. H/o PVT:  Monitor BP/heart rate TID--on coreg 12.5mg  BID>>up to 25mg  BID as of 09/10/22, Labetalol 10mg  q2h PRN  -2/23  BP has  been a little above goal, increase coreg dose to 25mg  BID  -09/25/22 BP/HR with fair control, continue to monitor -09/26/22 BP running high, restarted Losartan 25mg  QHS (previously on 100mg ) -3/18 controlled BP overall  10/04/2022    6:16 AM 10/04/2022    5:00 AM 10/03/2022    7:38 PM  Vitals with BMI  Weight  139 lbs 5 oz   Systolic [id number]  A999333  Diastolic 84  75  Pulse 88  95      10. Urinary retention: Has failed 4 voiding trails. Has had foley since 12/23             -maintain foley cath, foley mgt per protocol -- Continue Flomax 0.4mg  qPM. Will d/c urecholine.  -10/02/22 pt and daughter curious when TOV might occur; hoping to get her stronger and able to get to [facility] better before doing so; discussed that this will be a weekday team discussion, may end up going home with it -3/18 consider voiding trial later this week  11. VDRF s/p trach: Tolerating PMSV well -I ordered downsizing to CFS # 4 today--tolerated well -continue ATC w/ pulmonary hygiene -begin day time plugging of trach tomorrow. Decannulate soon. -Trial of capping with respiratory-  can trial 24 hour capping today -2/22 Leave trach uncapped tonight -2/28 plan for decannulation today, has had trach capped for >72hours -2/29 Lurline Idol was  removed  -doesn't need bandaid anymore on trach site   12. T2DM: Monitor BS ac/hs and use SSI for elevated BS.               --continue liberalized diet for now. -3/15 no longer checking cbgs per pt/family request although am serum glucose was elevated yesterday-- - resumed metformin 500mg   bid  -consider changing to CM diet at some point -3/18 glucose stable 104   13. Hypokalemia: Recurrent likely due to diarrhea/intake.  -09/18/22 stable K+ 4.2 on 09/17/22, wants to change to BID dosing so switched [place] to 27mEq BID (still will have 38mEq total/day)  -3/18 k+ stable at 4.1  14. Anemia of critical illness: Hgb 7.5-8.9 range -stable at 9.9---f/u 10/04/22    Latest Ref Rng & Units  10/04/2022    7:38 AM 09/27/2022    5:28 AM 09/20/2022    3:44 AM  CBC  WBC 4.0 - 10.5 K/uL 7.1  8.0  7.3   Hemoglobin 12.0 - 15.0 g/dL 10.4  9.9  10.1   Hematocrit 36.0 - 46.0 % 31.5  31.6  31.7   Platelets 150 - 400 K/uL 475  411  419      15. Hyponatremia/hypochloremia: I -09/04/22 Na 129, Cl 95, both stable, continue salt tabs 1g BID -Cortisol testing done during hospitalization (13.8 on 08/30/22), was normal, but daughter and pt want to make sure this is followed up- defer to weekday team regarding ongoing ?Addison's testing -2/21 129, a little decreased, fluid restriction started -2/28 stable at 131 -09/21/22 Na stable at 130, continue to follow with  q48h BMPs- adjust to 8am labs -Fluid restrictions liberalized to 1548ml -3/11 Na stable at 130, monitor on weekly labs -3/15 will relax to 1800 cc 3/18 stable Na 129  16. Encephalopathy/Delirium: improved substantially  17. Loose stools likely d/t TF and abx.            -recent rectal tube, since dc'ed  -generally improved -09/30/22 LBM --mushy but formed -10/03/22 more formed BMs, LBM 10/02/22, continue monitoring lBM  3/18  18. Hypothyroidism: continue synthroid 2mcg QD  19. Chronic combined CHF, BLE Swelling: recent 2D echo done 08/20/2022 showed apical hypokinesis, LVEF 40-45%, RV function was reduced. Intermittently diuresed during hospitalization, most recently given 40mg  IV lasix on 09/03/22 -09/04/22 noted to still have  BLE swelling, start 20mg  PO lasix and monitor lytes/BP/swelling -3/5 wt a little up, monitor tend -3/6 asked nutrition to stop milk/carnation- she wasn't drinking but it was counting toward her fluid restriction -3/7 wt stable, fluid restrictions liberalized to 159ml -09/25/22 wt stable, monitor -10/01/22 weight stable to decreased. Not sure it's real although po intake has been sporadic -10/02/22 wt uptrending but stable. Monitor  -10/03/22 wt stable, monitor Filed Weights   10/02/22 0639 10/03/22 0604 10/04/22  0500  Weight: 66.9 kg 65.8 kg 63.2 kg    20. Oral ulcer  --2/28 Orajel for oral ulcer discomfort 21.  Dry mucous membranes, may be related to anticholinergic meds cyclobenzaprine and trazodone, pain and sleep are   better so reluctant to stop at this time  22. Nausea: somewhat chronic, takes meds on empty stomach in the morning, compazine given and helped, continue PRN compazine 5-10mg , encouraged pt to have some food before meds     LOS: 31 days A FACE TO [place] 10/04/2022, 8:11 AM

## 2022-10-04 NOTE — Progress Notes (Signed)
Occupational Therapy Session Note  Patient Details  Name: [name] MRN: [id number] Date of Birth: [birth year]  Today's Date: 10/04/2022 OT Individual Time: 1119-1200 OT Individual Time Calculation (min): 41 min    Short Term Goals: Week 4:  OT Short Term Goal 1 (Week 4): Pt will complete stand pivot consistently with mod A OT Short Term Goal 1 - Progress (Week 4): Met OT Short Term Goal 2 (Week 4): Pt will complete sit > stand with mod A consistently OT Short Term Goal 2 - Progress (Week 4): Progressing toward goal OT Short Term Goal 3 (Week 4): Pt will stand statically with BUE support and no more than min A OT Short Term Goal 3 - Progress (Week 4): Met  Skilled Therapeutic Interventions/Progress Updates:    Pt greeted sitting in TIS wc and agreeable to OT treatment session, although still having some nausea. Pt brought to therapy gym in [place]. UB the-ex using SniFit arm bike on level 3. Pt completed 5 mins forwards, then 5 minutes going backwards with rest break in between. Continued UB there-ex using 2 lb dowel rod. 3 sets of 10 chest press, straight arm raise, and bicep curl. Rest breaks in between sets. Pt returned to room and completed stand-pivot back to bed with RW and min A. Mod A to lift B LE's back in bed. Pt left semi-reclined in bed with son present and needs met.   Therapy Documentation Precautions:  Precautions Precautions: Fall, Back Precaution Comments: trach, rectal tube, foley Required Braces or Orthoses: Other Brace Other Brace: prevlon boots Restrictions Weight Bearing Restrictions: No Other Position/Activity Restrictions: external rotation R hip at baseline at all times, RLE shorter than LLE, pt ambulated household distances PTA, w/c for community Pain: Pain Assessment Pain Scale: 0-10 Pain Score: 0-No pain   Therapy/Group: Individual Therapy  Valma Cava 10/04/2022, 11:34 AM

## 2022-10-04 NOTE — Progress Notes (Signed)
Speech Language Pathology Daily Session Note  Patient Details  Name: [name] MRN: [id number] Date of Birth: [birth year]  Today's Date: 10/04/2022 SLP Individual Time: [id number] SLP Individual Time Calculation (min): 37 min and Today's Date: 10/04/2022 SLP Missed Time: 8 Minutes Missed Time Reason: Other (Comment) (pt discussion with provider)  Short Term Goals: Week 5: SLP Short Term Goal 1 (Week 5): STG's = LTG's due to ELOS  Skilled Therapeutic Interventions:  Pt seen for skilled SLP session to address functional cognitive goals pertaining to structure and organization of memory book. Provided example template of how to organize day to day information and scheduling. Pt enthusiastic about this organization technique. She needed min cues to focus attention to completing various portions of template correctly. Pt left sitting upright in bed with call bell in reach and bed alarm activation. Recommend continue SLP PoC.  Pain Pain Assessment Pain Scale: 0-10 Pain Score: 0-No pain  Therapy/Group: Individual Therapy  Wyn Forster 10/04/2022, 10:21 AM

## 2022-10-04 NOTE — Consult Note (Addendum)
[place] Nurse wound follow up Wound type: surgical site dehiscence  Measurement: 1 cm x 0.5 cm x 1.5 cm  Wound bed: 30% slough 70% pink moist  Drainage (amount, consistency, odor) minimal serosanguinous  Periwound:closed incision line center, intact  Dressing procedure/placement/frequency: Continue Santyl as previously written:  Clean wound with NS, apply Santyl using Q tip applicator to yellow tissue surrounding wound and place in wound bed with Q tip, fill wound bed with saline moistened 2 x 2 (will not take the entire 2 x 2), cover with dry gauze and foam.  Did secure entire dressing with large Tegaderm.  Continue to place small piece of Aquacel to distal portion of wound, cover with foam.  May lift foam dressings to replace Santyl and Aquacel daily.  Change foam q3 days and prn soiling.   Reviewed POC with patient, son and bedside nurse.  WOC will continue to follow patient on Mondays and Thursdays.    Thank you,    Reilley Valentine MSN, RN-BC, [employer]

## 2022-10-04 NOTE — Progress Notes (Signed)
Physical Therapy Weekly Progress Note  Patient Details  Name: ARCILIA AEBERSOLD MRN: [id number] Date of Birth: 04-Jun-1956  Beginning of progress report period: September 21, 2022 End of progress report period: October 04, 2022  Today's Date: 10/04/2022 PT Individual Time: 1002-1059 PT Individual Time Calculation (min): 57 min   Patient has met 3 of 3 short term goals.  Pt is progressing very well toward mobility goals, improving independence with bed mobility, balance, transfers, and ambulation. Pt consistently performing bed mobility at minA level, transfers and ambulation with minA to modA, depending on fatigue and optimal body mechanics, and ambulating up to 70' with RW and close WC follow for safety. Pt will benefit from hand on family education prior to discharge.   Patient continues to demonstrate the following deficits muscle weakness, decreased cardiorespiratoy endurance, decreased awareness, decreased problem solving, decreased safety awareness, and decreased memory, and decreased sitting balance, decreased standing balance, decreased postural control, and decreased balance strategies and therefore will continue to benefit from skilled PT intervention to increase functional independence with mobility.  Patient progressing toward long term goals..  Continue plan of care.  PT Short Term Goals Week 3:  PT Short Term Goal 1 (Week 3): Pt will complete bed mobility with modA consistently. PT Short Term Goal 1 - Progress (Week 3): Met PT Short Term Goal 2 (Week 3): Pt will completes bed to chair transfers iwth modA consistently. PT Short Term Goal 2 - Progress (Week 3): Met PT Short Term Goal 3 (Week 3): Pt will ambulate x10' with modA +2 and LRAD. PT Short Term Goal 3 - Progress (Week 3): Met Week 4:  PT Short Term Goal 1 (Week 4): STGs = LTGs  Skilled Therapeutic Interventions/Progress Updates:  Ambulation/gait training;Discharge planning;Functional mobility training;Psychosocial  support;Therapeutic Activities;Visual/perceptual remediation/compensation;Wheelchair propulsion/positioning;Therapeutic Exercise;Skin care/wound management;Neuromuscular re-education;Disease management/prevention;Balance/vestibular training;Cognitive remediation/compensation;DME/adaptive equipment instruction;Pain management;Splinting/orthotics;UE/LE Strength taining/ROM;Community reintegration;Functional electrical stimulation;Patient/family education;Stair training;UE/LE Coordination activities   Pt sitting on BSC upon PT arrival. While pt is having bowel movement, PT discusses recommendations for safe mobility following discharge with son, Emilio Math. Pt performs sit to stand from [facility] with minA and RW, requiring assistance to pull pants up over hips. Pt performs stand step transfer to tilt in space WC with minA initially but has LOB backward, requiring maxA to prevent fall. Hips guided to [place] safely and pt educated on anterior weight shifting to prevent posterior LOBs. WC transport to gym. Pt takes seated rest break. Pt performs sit to stand to RW with minA and cues for hand placement and initiation, as well as body mechanics and powering up. Pt ambulates to fatigue, x20' with minA and cues for posture and step sequencing, with +2 WC follow from son for safety. Following extended seated rest break, pt ambulates additional x15' with similar cues and assistance provided. Pt then performs x10 seated triceps "press-ups" to work on functional mobility and extensor strength in upper extremities. Pt left seated in WC with all needs within reach.  Therapy Documentation Precautions:  Precautions Precautions: Fall, Back Precaution Comments: trach, rectal tube, foley Required Braces or Orthoses: Other Brace Other Brace: prevlon boots Restrictions Weight Bearing Restrictions: No Other Position/Activity Restrictions: external rotation R hip at baseline at all times, RLE shorter than LLE, pt ambulated household  distances PTA, w/c for community   Therapy/Group: Individual Therapy  Breck Coons, PT, DPT 10/04/2022, 10:40 AM

## 2022-10-05 DIAGNOSIS — R112 Nausea with vomiting, unspecified: Secondary | ICD-10-CM

## 2022-10-05 DIAGNOSIS — R339 Retention of urine, unspecified: Secondary | ICD-10-CM

## 2022-10-05 MED ORDER — OXYCODONE-ACETAMINOPHEN 5-325 MG PO TABS
1.0000 | ORAL_TABLET | ORAL | Status: DC | PRN
  Administered 2022-10-06 – 2022-10-13 (×19): 1 via ORAL
  Filled 2022-10-05 (×21): qty 1

## 2022-10-05 MED ORDER — LIDOCAINE HCL URETHRAL/MUCOSAL 2 % EX GEL
CUTANEOUS | Status: DC | PRN
  Filled 2022-10-05: qty 6

## 2022-10-05 NOTE — Progress Notes (Signed)
Speech Language Pathology Daily Session Note  Patient Details  Name: [name] MRN: [id number] Date of Birth: [birth year]  Today's Date: 10/05/2022 SLP Individual Time: 0100-0146 SLP Individual Time Calculation (min): 46 min  Short Term Goals: Week 5: SLP Short Term Goal 1 (Week 5): STG's = LTG's due to ELOS  Skilled Therapeutic Interventions:   Pt was seen in PM to address cognitive re- training. Pt was alert and seen at bedside with dtr present initially. Pt initially appeared reluctant to intervention however, easily encouraged to participate given support from her dtr. Pt reports recent discussions with family surrounding upcoming D/C which has caused some increased anxiety. Pt recalled previous ST session where SLP initiated template for organizing information to recall. Upon SLP observation, pt noted with some improvement in use of header to label information. SLP instructed pt in additional compensatory strategies, WRAP. SLP trained pt in acronym and examples of utilization. 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 Conversation continued to revert back to feelings regarding upcoming DC. SLP guided pt in utilizing memory book in order to successfully organize thoughts and ideas for easy recall. Pt started lists for questions to ask providers and  to do lists. Pt reported only one instance of forgetting her thought this session which she felt was an improvement. She reported appreciation for session. Pt left at bedside with call button within reach and bed alarms active. SLP to continue POC.   Pain No pain. Pt reported nausea during session with nurse responding with med management.   Therapy/Group: Individual Therapy  Colin Benton 10/05/2022, 3:53 PM

## 2022-10-05 NOTE — Progress Notes (Signed)
Foley was removed. Pt tolerated well.

## 2022-10-05 NOTE — Progress Notes (Signed)
PROGRESS NOTE   Subjective/Complaints:  Patient has been having nausea that she feels started when she was transition to her doxycycline. She is interested in a voiding trail.  She has reported some bladder fullness sensation. . ROS: +back pain-managed, +nausea+vomiting . + Bladder fullness sensation denies fevers, chills, CP, SOB, abd pain, D/C, new/worsening paresthesias/weakness, or any other complaints at this time.     Objective:   No results found. Recent Labs    10/04/22 0738  WBC 7.1  HGB 10.4*  HCT 31.5*  PLT 475*        Recent Labs    10/04/22 0738  NA 129*  K 4.1  CL 96*  CO2 24  GLUCOSE 104*  BUN 7*  CREATININE 0.45  CALCIUM 9.3     Intake/Output Summary (Last 24 hours) at 10/05/2022 0831 Last data filed at 10/05/2022 0555 Gross per 24 hour  Intake --  Output 1675 ml  Net -1675 ml         Physical Exam: Vital Signs Blood pressure 122/75, pulse 84, temperature 98.1 F (36.7 C), resp. rate 17, height 5\' 1"  (1.549 m), weight 64.8 kg, SpO2 95 %.   Constitutional:  thin, NAD, lying in bed, dry heaved at one point  HENT: [place]/AT,  MMM today  Eyes: Conjugate gaze conjunctiva clear, PERRL Neck: Trach site well healed Cardiovascular: RRR, soft murmur heard (VSD), no pedal edema Pulmonary: CTAB no w/r/r, no increased WOB Abdominal: Soft, nontender, nondistended, positive bowel sounds/normal active Genitourinary:    Comments:Sacral wound dressing clean-not assessed this morning  foley in place-clear yellow urine Ext: no clubbing, cyanosis, or edema Psych: a little anxious  Skin: upper back wound just dressed. Did not re-open dressing today. Lower back wound covered.  PRIOR EXAM: Neurological: Follows commands, some STM deficits. Decreased insight still Alert and oriented x3, no hypertonia noted, CN 2-12 grossly intact. Motor 4/4 UE and 3-4/5 LE prox to distal--stable to improved MSK: LB   remains tender. Tenderness R chest wall, mid-low back    Assessment/Plan: 1. Functional deficits which require 3+ hours per day of interdisciplinary therapy in a comprehensive inpatient rehab setting. Physiatrist is providing close team supervision and 24 hour management of active medical problems listed below. Physiatrist and rehab team continue to assess barriers to discharge/monitor patient progress toward functional and medical goals  Care Tool:  Bathing  Bathing activity did not occur: Refused           Bathing assist       Upper Body Dressing/Undressing Upper body dressing   What is the patient wearing?: Hospital gown only    Upper body assist Assist Level: Total Assistance - Patient < 25%    Lower Body Dressing/Undressing Lower body dressing      What is the patient wearing?: Hospital gown only     Lower body assist Assist for lower body dressing: Total Assistance - Patient < 25%     Toileting Toileting Toileting Activity did not occur (Clothing management and hygiene only): N/A (no void or bm) (using catheter and rectal tube)  Toileting assist Assist for toileting: Dependent - Patient 0%     Transfers Chair/bed transfer  Transfers  assist  Chair/bed transfer activity did not occur: Safety/medical concerns  Chair/bed transfer assist level: Maximal Assistance - Patient 25 - 49%     Locomotion Ambulation   Ambulation assist   Ambulation activity did not occur: Safety/medical concerns          Walk 10 feet activity   Assist  Walk 10 feet activity did not occur: Safety/medical concerns        Walk 50 feet activity   Assist Walk 50 feet with 2 turns activity did not occur: Safety/medical concerns         Walk 150 feet activity   Assist Walk 150 feet activity did not occur: Safety/medical concerns         Walk 10 feet on uneven surface  activity   Assist Walk 10 feet on uneven surfaces activity did not occur:  Safety/medical concerns         Wheelchair     Assist Is the patient using a wheelchair?: Yes Type of Wheelchair: Manual Wheelchair activity did not occur: Safety/medical concerns         Wheelchair 50 feet with 2 turns activity    Assist    Wheelchair 50 feet with 2 turns activity did not occur: Safety/medical concerns       Wheelchair 150 feet activity     Assist  Wheelchair 150 feet activity did not occur: Safety/medical concerns       Blood pressure 122/75, pulse 84, temperature 98.1 F (36.7 C), resp. rate 17, height 5\' 1"  (1.549 m), weight 64.8 kg, SpO2 95 %.  Medical Problem List and Plan: 1. Functional deficits secondary to osteomyelitis, infection of lumbar surgical site complicated by meningitis/ventriculitis.             -pt also with significant deconditioning             -patient may not yet shower -ELOS/Goals: extended to 3/27, pt agreeable. Will proceed with that ELOS in mind.  Min/Mod A goals with PT, OT, SLP  -Continue CIR therapies including PT, OT, and SLP   -Team conference tomorrow  2.  Antithrombotics: -DVT/anticoagulation:  Pharmaceutical: Lovenox 40mg  QD             -antiplatelet therapy: N/a  3. Pain Management: Butrans 51mcg/hr patch for pain control w/ Flexeril 5mg  TID, Tylenol PRN, Oxycodone 7.5mg  q4h PRN, Gabapentin 100mg  TID  -2/26 tylenol PRN for HA -2/28 Fioricet ordered PRN headache -09/17/22 will change PRN oxycodone 2.5mg  to percocet 5-325mg , consider increase gabapentin to help with pain and HA- make changes gradually due to hx of encephalopathy  -3/7 Voltaren gel for L shoulder pain -3/8   lidocaine for chest wall tenderness started -3/15- continue flexeril  10mg  tid -She's using oxycodone 5mg  3-4x daily, butrans ptch 95mcg/hr per week as well as gabapentin tid and tylenol and butalbital prn -10/02/22 pain overall managed but breakthrough pain happening more and not as well managed, considered Tramadol but this would not  be beneficial per pharmacy due to her butrans patch. Don't want to go up on Oxycodone if we can avoid it; pt agreeable to tracking pain over the weekend, and discussing with weekday team whether longer acting opioid would be more beneficial with shorter acting PRNs; if pain uncontrolled this weekend, may switch. Monitor for now.  -Ordered lidoderm patches x2 for low back to see if we can get relief that way -10/03/22 pain doing some better, will ask primary team regarding pain regimen but for now she's doing alright.  -  3/18 continue current regimen for now, asked nursing to replace butrans patch that has fallen off   4. Mood/Behavior/Sleep: LCSW to follow for evaluation and support.             --Trazodone 100mg  QHS and melatonin 5mg  QHS PRN for  insomnia/mood  -09/04/22 placed sign on door to minimize interruptions 11p-5a; changed CBG checks as below to minimize interruptions             -antipsychotic agents:  Luvox 100mg  QHS and Lamictal 200mg  QD  -Continue Aricept 10mg  QD  -Seen by neuropsych-appreciate assistance  -3/1 increase melatonin to 10mg  dose QHS PRN  -3/4 decrease melatonin back to 5mg  dose QHS PRN -3/11 melatonin 5mg  working reasonably well  -3/15 seems to be dealing with extended LOS a bit better today   -team to continue to provide ego support   -10/02/22 better spirits! [place] with extended LOS  -10/04/22 discussed with Dr. Lovette Cliche, psychiatry consult was placed regarding psychiatric medications, depression, anxiety  5. Neuropsych/cognition: This patient is not fully capable of making decisions on her own behalf. -reviewed cognitive recovery with patient this week. May take until this summer to plateau. Had some mild memory deficits PTA as well.   6. Skin/Wound Care: WOC to follow for wound VAC changes --MASD managed by fecal system-- flexiseal clogged 09/04/22 AM but nursing states it's now working; monitor--might be able to d/c soon? -2/21 WOC stopped VAC, start aquacel and  dressing -see flexiseal discussion below, #17 -2/27 WOC following, slough pulled away revealing 2cm deep defect -2/29 Spoke with Dr. Zada Finders today about wound depth, continue local wound care- continue to monitor, continue abx per ID,  no plan for procedure at this time -3/4 Seen by [place]- continue aquacel, abd pad/tape, apprecaite assistance -3/7 WOC rec medihoney, fill wound with moistened gauza, distal portion of wound to be backed with silver hydrofiber, appreciate assistance -3/8 Dr. Zada Finders contacted about wound, feels like this is doing well overall, continue current wound care orders, appreciate assistance -3/14-15 changed dressing to collagenase to see if we can get better debridement of fibronecrotic tissue. Small area really needs a bit of debridement  -I contacted NS re: f/u wound next week -3/28 seen by [facility] today, continue to monitor  7. Fluids/Electrolytes/Nutrition: Monitor I/O. Continue tube feeds-->likely contributing to diarrhea             -monitor intake closely, continue vitamins -2/19 discontinue core track, Consult dietician -2/27 decrease BMP to every other day -3/14--change labs to weekly -3/15 may keep PICC for weekend and until Monday AM lab draws. If all remains stable, labs ok, then we can remove PICC -10/02/22 overnight PICC discomfort, no blood return, pt wanted it removed so PICC team removed it; if further IV needs occur, would need PIV  8.Sepsis w/ osteomyelitis/meningitis/encephalitis: Continue Cefepime 2g q8h, Vanc 1250mg  q12h, and Flagyl 500mg  q8h for at least 6 weeks --  Vanc+Cefepime complete. Metronidazole has been discontinued as of 09/16/22 (was primarily for PNA, no longer needed) -3/13 --reviewed repeat MRI of brain with resolution! Spoke with NS -3/15 converting to levaquin 750mg  QD and doxycycline 100mg  BID today -f/u with Dr. [name] scheduled for 3/25--since she'll be here, he or partner can see while in rehab.  -3/19 we will contact Dr.  Drucilla Schmidt to see if any alternative antibiotics are available due to nausea   9. H/o PVT:  Monitor BP/heart rate TID--on coreg 12.5mg  BID>>up to 25mg  BID as of 09/10/22, Labetalol 10mg  q2h PRN  -  2/23  BP has been a little above goal, increase coreg dose to 25mg  BID  -09/25/22 BP/HR with fair control, continue to monitor -09/26/22 BP running high, restarted Losartan 25mg  QHS (previously on 100mg ) -3/19 well controlled     10/05/2022    5:52 AM 10/05/2022    5:00 AM 10/04/2022    7:56 PM  Vitals with BMI  Weight  142 lbs 14 oz   Systolic [id number]  [id number]  Diastolic 75  82  Pulse 84  95      10. Urinary retention: Has failed 4 voiding trails. Has had foley since 12/23             -maintain foley cath, foley mgt per protocol -- Continue Flomax 0.4mg  qPM. Will d/c urecholine.  -10/02/22 pt and daughter curious when TOV might occur; hoping to get her stronger and able to get to [facility] better before doing so; discussed that this will be a weekday team discussion, may end up going home with it -3/19 voiding trial today  11. VDRF s/p trach: Tolerating PMSV well -I ordered downsizing to CFS # 4 today--tolerated well -continue ATC w/ pulmonary hygiene -begin day time plugging of trach tomorrow. Decannulate soon. -Trial of capping with respiratory-  can trial 24 hour capping today -2/22 Leave trach uncapped tonight -2/28 plan for decannulation today, has had trach capped for >72hours -2/29 Lurline Idol was  removed  -doesn't need bandaid anymore on trach site   12. T2DM: Monitor BS ac/hs and use SSI for elevated BS.               --continue liberalized diet for now. -3/15 no longer checking cbgs per pt/family request although am serum glucose was elevated yesterday-- - resumed metformin 500mg   bid  -consider changing to CM diet at some point -3/18 glucose stable 104   13. Hypokalemia: Recurrent likely due to diarrhea/intake.  -09/18/22 stable K+ 4.2 on 09/17/22, wants to change to BID dosing so switched [place] to  54mEq BID (still will have 28mEq total/day)  -3/18 k+ stable at 4.1  14. Anemia of critical illness: Hgb 7.5-8.9 range -stable at 9.9---f/u 10/04/22    Latest Ref Rng & Units 10/04/2022    7:38 AM 09/27/2022    5:28 AM 09/20/2022    3:44 AM  CBC  WBC 4.0 - 10.5 K/uL 7.1  8.0  7.3   Hemoglobin 12.0 - 15.0 g/dL 10.4  9.9  10.1   Hematocrit 36.0 - 46.0 % 31.5  31.6  31.7   Platelets 150 - 400 K/uL 475  411  419      15. Hyponatremia/hypochloremia: I -09/04/22 Na 129, Cl 95, both stable, continue salt tabs 1g BID -Cortisol testing done during hospitalization (13.8 on 08/30/22), was normal, but daughter and pt want to make sure this is followed up- defer to weekday team regarding ongoing ?Addison's testing -2/21 129, a little decreased, fluid restriction started -2/28 stable at 131 -09/21/22 Na stable at 130, continue to follow with  q48h BMPs- adjust to 8am labs -Fluid restrictions liberalized to 1562ml -3/11 Na stable at 130, monitor on weekly labs -3/15 will relax to 1800 cc 3/18 stable Na 129  16. Encephalopathy/Delirium: improved substantially  17. Loose stools likely d/t TF and abx.            -recent rectal tube, since dc'ed  -generally improved -09/30/22 LBM --mushy but formed -10/03/22 more formed BMs, LBM 10/02/22, continue monitoring lBM  3/18  18. Hypothyroidism: continue synthroid 44mcg  QD  19. Chronic combined CHF, BLE Swelling: recent 2D echo done 08/20/2022 showed apical hypokinesis, LVEF 40-45%, RV function was reduced. Intermittently diuresed during hospitalization, most recently given 40mg  IV lasix on 09/03/22 -09/04/22 noted to still have BLE swelling, start 20mg  PO lasix and monitor lytes/BP/swelling -3/5 wt a little up, monitor tend -3/6 asked nutrition to stop milk/carnation- she wasn't drinking but it was counting toward her fluid restriction -3/7 wt stable, fluid restrictions liberalized to 1558ml -09/25/22 wt stable, monitor -10/01/22 weight stable to decreased. Not  sure it's real although po intake has been sporadic -10/02/22 wt uptrending but stable. Monitor  -10/03/22 wt stable, monitor Filed Weights   10/03/22 0604 10/04/22 0500 10/05/22 0500  Weight: 65.8 kg 63.2 kg 64.8 kg    20. Oral ulcer  --2/28 Orajel for oral ulcer discomfort 21.  Dry mucous membranes, may be related to anticholinergic meds cyclobenzaprine and trazodone, pain and sleep are   better so reluctant to stop at this time  22. Nausea: somewhat chronic, takes meds on empty stomach in the morning, compazine given and helped, continue PRN compazine 5-10mg , encouraged pt to have some food before meds  -3/9 Continue compazine,  advised to take meds with food in AM     LOS: 32 days A FACE TO [place] 10/05/2022, 8:31 AM

## 2022-10-05 NOTE — Progress Notes (Signed)
Occupational Therapy Session Note  Patient Details  Name: CONTESSIA [name] MRN: [id number] Date of Birth: 08-29-1955  Session 1 Today's Date: 10/05/2022 OT Individual Time: [id number] OT Individual Time Calculation (min): 45 min    Session 2  Today's Date: 10/05/2022 OT Individual Time: [id number] OT Individual Time Calculation (min): 58 min    Short Term Goals: Week 5:  OT Short Term Goal 1 (Week 5): Pt will complete a stand pivot transfer with min A OT Short Term Goal 2 (Week 5): Pt will complete toileting tasks with mod A OT Short Term Goal 3 (Week 5): Pt will complete sit > stand with min A  Skilled Therapeutic Interventions/Progress Updates:    Session 1 Pt received supine with moderate back pain, in the middle of a wound change from RN staff, agreeable to OT session. She came to EOB with min A to lift her trunk. She donned a shirt with (S). She asked to apply makeup- OT encouraging of this to promote increased self efficacy and confidence. Edu and discussion re use of power w/c as pt still not sure of her decision which she would like. She is having a family meeting today to hopefully clarify d/c planning. She completed a sit >stand with CGA with the RW from EOB. She completed short distance mobility with CGA to the power w/c. She required min A to scoot her hips back on the [facility] cushion. She completed w/c navigation in the room to the mirror and then to the therapy gym with min cueing/assist to position. She completed 17 ft and then 15 ft of functional mobility with the RW with CGA, increasing to min A as she fatigued. Rest break required between each trial. Mobility completed to simulate household distances and maximize independence at RW level. She was left sitting up in the w/c awaiting PT session.    Session 2  Pt received supine with no c/o pain, agreeable to OT session. Emotional support provided as she spoke about family meeting that occurred today. Manual wheelchairs from  home will be brought in tomorrow in order to assess sitting tolerance and pt ability to propel. She came to EOB with min A. She stood with CGA and with light min A used the RW for short distance mobility to the power w/c. She sat in the power w/c and required mod A to scoot back and w/c features to facilitate hips scooting back. Her lunch was warmed up for her per her request. Remainder of session focused on planning/problem solving through home accessibility with pt actively engaged, writing notes and asking intuitive questions re maximizing independence at home. Discussed shower transfers (with demonstration), bed access, fall risk reduction, and DME use. She was left sitting up with all needs met. Nursing staff alerted to pt being in power w/c.     Therapy Documentation Precautions:  Precautions Precautions: Fall, Back Precaution Comments: foley Required Braces or Orthoses: Other Brace Other Brace: prevlon boots Restrictions Weight Bearing Restrictions: No Other Position/Activity Restrictions: external rotation R hip at baseline at all times, RLE shorter than LLE, pt ambulated household distances PTA, w/c for community  Therapy/Group: Individual Therapy  Curtis Sites 10/05/2022, 6:14 AM

## 2022-10-05 NOTE — Progress Notes (Signed)
Physical Therapy Session Note  Patient Details  Name: [name] MRN: [id number] Date of Birth: February 18, 1956  Today's Date: 10/05/2022 PT Individual Time: 1110-1200 PT Individual Time Calculation (min): 50 min  and Today's Date: 10/05/2022 PT Missed Time: 10 Minutes Missed Time Reason: Toileting  Short Term Goals: Week 4:  PT Short Term Goal 1 (Week 4): STGs = LTGs  Skilled Therapeutic Interventions/Progress Updates:     Pt misses 10 minutes due to toileting. Pt received seated in power WC and agrees to therapy. Reports pain in back periodically during session. PT provides repositioning in power WC to address pressure on back and manage pain. Much of session focused on pt's concerns for discharge and addressing questions about mobility, caregiver education, and DME. PT demonstrates functions of power chair to recliner and tilt to offload pressure on spine. PT also assists pt with use of foot plates to reposition, with cues for anterior weight shift and hand placement to scoot hips in chair. PA arrives to provide pt with information regarding foley catheter and medications. While pt is having conversation with PA, PT provides pt with RW and cues for sit to stand. Pt able to stand from power WC with CGA and cues for hand placement and body mechanics. Pt able to maintain static standing with periodic upper extremity support for ~5 minutes while speaking with PA. PT cues pt for safe positioning for transition back to [facility] for rest break. Pt left seated in WC with all needs within reach.   Therapy Documentation Precautions:  Precautions Precautions: Fall, Back Precaution Comments: foley Required Braces or Orthoses: Other Brace Other Brace: prevlon boots Restrictions Weight Bearing Restrictions: No Other Position/Activity Restrictions: external rotation R hip at baseline at all times, RLE shorter than LLE, pt ambulated household distances PTA, w/c for community   Therapy/Group: Individual  Therapy  Breck Coons, PT, DPT 10/05/2022, 5:20 PM

## 2022-10-06 MED ORDER — TAMSULOSIN HCL 0.4 MG PO CAPS
0.8000 mg | ORAL_CAPSULE | Freq: Every day | ORAL | Status: DC
  Administered 2022-10-06 – 2022-10-12 (×7): 0.8 mg via ORAL
  Filled 2022-10-06 (×7): qty 2

## 2022-10-06 NOTE — Progress Notes (Signed)
PROGRESS NOTE   Subjective/Complaints:  Foley removed yesterday and she was started on voiding trial. She has required IC. She reports she did not like how staff member at night talked to her when IC was needed, she agreed to complete IC. She had psych consult scheduled then refused this AM, psych will try again tomorrow- appreciate assistance.   . ROS: +back pain-managed, +nausea-improved . + Bladder fullness sensation denies fevers, chills, CP, SOB, abd pain, D/C, new/worsening paresthesias/weakness, or any other complaints at this time.     Objective:   No results found. Recent Labs    10/04/22 0738  WBC 7.1  HGB 10.4*  HCT 31.5*  PLT 475*        Recent Labs    10/04/22 0738  NA 129*  K 4.1  CL 96*  CO2 24  GLUCOSE 104*  BUN 7*  CREATININE 0.45  CALCIUM 9.3      Intake/Output Summary (Last 24 hours) at 10/06/2022 0830 Last data filed at 10/06/2022 0325 Gross per 24 hour  Intake 720 ml  Output 3250 ml  Net -2530 ml         Physical Exam: Vital Signs Blood pressure 120/75, pulse 89, temperature 98.3 F (36.8 C), resp. rate 18, height 5\' 1"  (1.549 m), weight 64.5 kg, SpO2 98 %.   Constitutional:  thin, NAD, lying in bed HENT: [place]/AT,  MMM today  Eyes: Conjugate gaze conjunctiva clear, PERRL Neck: Trach site well healed Cardiovascular: RRR, soft murmur heard (VSD), no pedal edema Pulmonary: CTAB no w/r/r, no increased WOB Abdominal: Soft, nontender, nondistended, positive bowel sounds/normal active Genitourinary:    Comments:Sacral wound dressing clean  foley in place-clear yellow urine Ext: no clubbing, cyanosis, or edema Psych: a little agitated  Skin: upper back wound just dressed. Did not re-open dressing today. Lower back wound covered.  PRIOR EXAM: Neurological: Follows commands, some STM deficits. Decreased insight still Alert and oriented x3, no hypertonia noted, CN 2-12 grossly  intact. Motor 4/4 UE and 3-4/5 LE prox to distal--stable to improved MSK: LB  remains tender. Tenderness R chest wall, mid-low back    Assessment/Plan: 1. Functional deficits which require 3+ hours per day of interdisciplinary therapy in a comprehensive inpatient rehab setting. Physiatrist is providing close team supervision and 24 hour management of active medical problems listed below. Physiatrist and rehab team continue to assess barriers to discharge/monitor patient progress toward functional and medical goals  Care Tool:  Bathing  Bathing activity did not occur: Refused           Bathing assist       Upper Body Dressing/Undressing Upper body dressing   What is the patient wearing?: Hospital gown only    Upper body assist Assist Level: Total Assistance - Patient < 25%    Lower Body Dressing/Undressing Lower body dressing      What is the patient wearing?: Hospital gown only     Lower body assist Assist for lower body dressing: Total Assistance - Patient < 25%     Toileting Toileting Toileting Activity did not occur (Clothing management and hygiene only): N/A (no void or bm) (using catheter and rectal tube)  Toileting  assist Assist for toileting: Dependent - Patient 0%     Transfers Chair/bed transfer  Transfers assist  Chair/bed transfer activity did not occur: Safety/medical concerns  Chair/bed transfer assist level: Maximal Assistance - Patient 25 - 49%     Locomotion Ambulation   Ambulation assist   Ambulation activity did not occur: Safety/medical concerns          Walk 10 feet activity   Assist  Walk 10 feet activity did not occur: Safety/medical concerns        Walk 50 feet activity   Assist Walk 50 feet with 2 turns activity did not occur: Safety/medical concerns         Walk 150 feet activity   Assist Walk 150 feet activity did not occur: Safety/medical concerns         Walk 10 feet on uneven surface   activity   Assist Walk 10 feet on uneven surfaces activity did not occur: Safety/medical concerns         Wheelchair     Assist Is the patient using a wheelchair?: Yes Type of Wheelchair: Manual Wheelchair activity did not occur: Safety/medical concerns         Wheelchair 50 feet with 2 turns activity    Assist    Wheelchair 50 feet with 2 turns activity did not occur: Safety/medical concerns       Wheelchair 150 feet activity     Assist  Wheelchair 150 feet activity did not occur: Safety/medical concerns       Blood pressure 120/75, pulse 89, temperature 98.3 F (36.8 C), resp. rate 18, height 5\' 1"  (1.549 m), weight 64.5 kg, SpO2 98 %.  Medical Problem List and Plan: 1. Functional deficits secondary to osteomyelitis, infection of lumbar surgical site complicated by meningitis/ventriculitis.             -pt also with significant deconditioning             -patient may not yet shower -ELOS/Goals: extended to 3/27, pt agreeable. Will proceed with that ELOS in mind.  Min/Mod A goals with PT, OT, SLP  -Continue CIR therapies including PT, OT, and SLP   -Team conference today please see physician documentation under team conference tab, met with team  to discuss problems,progress, and goals. Formulized individual treatment plan based on medical history, underlying problem and comorbidities.    2.  Antithrombotics: -DVT/anticoagulation:  Pharmaceutical: Lovenox 40mg  QD             -antiplatelet therapy: N/a  3. Pain Management: Butrans 62mcg/hr patch for pain control w/ Flexeril 5mg  TID, Tylenol PRN, Oxycodone 7.5mg  q4h PRN, Gabapentin 100mg  TID  -2/26 tylenol PRN for HA -2/28 Fioricet ordered PRN headache -09/17/22 will change PRN oxycodone 2.5mg  to percocet 5-325mg , consider increase gabapentin to help with pain and HA- make changes gradually due to hx of encephalopathy  -3/7 Voltaren gel for L shoulder pain -3/8   lidocaine for chest wall tenderness  started -3/15- continue flexeril  10mg  tid -She's using oxycodone 5mg  3-4x daily, butrans ptch 37mcg/hr per week as well as gabapentin tid and tylenol and butalbital prn -10/02/22 pain overall managed but breakthrough pain happening more and not as well managed, considered Tramadol but this would not be beneficial per pharmacy due to her butrans patch. Don't want to go up on Oxycodone if we can avoid it; pt agreeable to tracking pain over the weekend, and discussing with weekday team whether longer acting opioid would be more beneficial with  shorter acting PRNs; if pain uncontrolled this weekend, may switch. Monitor for now.  -Ordered lidoderm patches x2 for low back to see if we can get relief that way -10/03/22 pain doing some better, will ask primary team regarding pain regimen but for now she's doing alright.  -3/18 continue current regimen for now, asked nursing to replace butrans patch that has fallen off   4. Mood/Behavior/Sleep: LCSW to follow for evaluation and support.             --Trazodone 100mg  QHS and melatonin 5mg  QHS PRN for  insomnia/mood  -09/04/22 placed sign on door to minimize interruptions 11p-5a; changed CBG checks as below to minimize interruptions             -antipsychotic agents:  Luvox 100mg  QHS and Lamictal 200mg  QD  -Continue Aricept 10mg  QD  -Seen by neuropsych-appreciate assistance  -3/1 increase melatonin to 10mg  dose QHS PRN  -3/4 decrease melatonin back to 5mg  dose QHS PRN -3/11 melatonin 5mg  working reasonably well  -3/15 seems to be dealing with extended LOS a bit better today   -team to continue to provide ego support   -10/02/22 better spirits! [place] with extended LOS  -10/04/22 discussed with Dr. Lovette Cliche, psychiatry consult was placed regarding psychiatric medications, depression, anxiety  3/19 pt refused psych consult- psych to try again tomorrow  5. Neuropsych/cognition: This patient is not fully capable of making decisions on her own behalf. -reviewed  cognitive recovery with patient this week. May take until this summer to plateau. Had some mild memory deficits PTA as well.   6. Skin/Wound Care: WOC to follow for wound VAC changes --MASD managed by fecal system-- flexiseal clogged 09/04/22 AM but nursing states it's now working; monitor--might be able to d/c soon? -2/21 WOC stopped VAC, start aquacel and dressing -see flexiseal discussion below, #17 -2/27 WOC following, slough pulled away revealing 2cm deep defect -2/29 Spoke with Dr. Zada Finders today about wound depth, continue local wound care- continue to monitor, continue abx per ID,  no plan for procedure at this time -3/4 Seen by [place]- continue aquacel, abd pad/tape, apprecaite assistance -3/7 WOC rec medihoney, fill wound with moistened gauza, distal portion of wound to be backed with silver hydrofiber, appreciate assistance -3/8 Dr. Zada Finders contacted about wound, feels like this is doing well overall, continue current wound care orders, appreciate assistance -3/14-15 changed dressing to collagenase to see if we can get better debridement of fibronecrotic tissue. Small area really needs a bit of debridement  -I contacted NS re: f/u wound next week -3/28 seen by [facility] today, continue to monitor  7. Fluids/Electrolytes/Nutrition: Monitor I/O. Continue tube feeds-->likely contributing to diarrhea             -monitor intake closely, continue vitamins -2/19 discontinue core track, Consult dietician -2/27 decrease BMP to every other day -3/14--change labs to weekly -3/15 may keep PICC for weekend and until Monday AM lab draws. If all remains stable, labs ok, then we can remove PICC -10/02/22 overnight PICC discomfort, no blood return, pt wanted it removed so PICC team removed it; if further IV needs occur, would need PIV  8.Sepsis w/ osteomyelitis/meningitis/encephalitis: Continue Cefepime 2g q8h, Vanc 1250mg  q12h, and Flagyl 500mg  q8h for at least 6 weeks --  Vanc+Cefepime complete.  Metronidazole has been discontinued as of 09/16/22 (was primarily for PNA, no longer needed) -3/13 --reviewed repeat MRI of brain with resolution! Spoke with NS -3/15 converting to levaquin 750mg  QD and doxycycline 100mg  BID today -  f/u with Dr. [name] scheduled for 3/25--since she'll be here, he or partner can see while in rehab.  -3/19 we will contact Dr. Drucilla Schmidt to see if any alternative antibiotics are available due to nausea -3/20 Dr [name] recommended continue doxycyline, advised pt to take medications with food to minimize nausea   9. H/o PVT:  Monitor BP/heart rate TID--on coreg 12.5mg  BID>>up to 25mg  BID as of 09/10/22, Labetalol 10mg  q2h PRN  -2/23  BP has been a little above goal, increase coreg dose to 25mg  BID  -09/25/22 BP/HR with fair control, continue to monitor -09/26/22 BP running high, restarted Losartan 25mg  QHS (previously on 100mg ) -3/20 controlled     10/06/2022    4:39 AM 10/06/2022    3:23 AM 10/05/2022    8:23 PM  Vitals with BMI  Weight 142 lbs 3 oz    Systolic  [id number]  Diastolic  75 85  Pulse  89 105      10. Urinary retention: Has failed 4 voiding trails. Has had foley since 12/23             -maintain foley cath, foley mgt per protocol -- Continue Flomax 0.4mg  qPM. Will d/c urecholine.  -10/02/22 pt and daughter curious when TOV might occur; hoping to get her stronger and able to get to [facility] better before doing so; discussed that this will be a weekday team discussion, may end up going home with it -3/19 voiding trial today -3/20 Has required IC, continue voiding trial, increase flomax to 0.8mg ,  may need f/u with urology  11. VDRF s/p trach: Tolerating PMSV well -I ordered downsizing to CFS # 4 today--tolerated well -continue ATC w/ pulmonary hygiene -begin day time plugging of trach tomorrow. Decannulate soon. -Trial of capping with respiratory-  can trial 24 hour capping today -2/22 Leave trach uncapped tonight -2/28 plan for decannulation today, has had  trach capped for >72hours -2/29 Lurline Idol was  removed  -doesn't need bandaid anymore on trach site   12. T2DM: Monitor BS ac/hs and use SSI for elevated BS.               --continue liberalized diet for now. -3/15 no longer checking cbgs per pt/family request although am serum glucose was elevated yesterday-- - resumed metformin 500mg   bid  -consider changing to CM diet at some point -3/18 glucose stable 104   13. Hypokalemia: Recurrent likely due to diarrhea/intake.  -09/18/22 stable K+ 4.2 on 09/17/22, wants to change to BID dosing so switched Klor to 24mEq BID (still will have 39mEq total/day)  -3/18 k+ stable at 4.1  14. Anemia of critical illness: Hgb 7.5-8.9 range -stable at 9.9---f/u 10/04/22    Latest Ref Rng & Units 10/04/2022    7:38 AM 09/27/2022    5:28 AM 09/20/2022    3:44 AM  CBC  WBC 4.0 - 10.5 K/uL 7.1  8.0  7.3   Hemoglobin 12.0 - 15.0 g/dL 10.4  9.9  10.1   Hematocrit 36.0 - 46.0 % 31.5  31.6  31.7   Platelets 150 - 400 K/uL 475  411  419      15. Hyponatremia/hypochloremia: I -09/04/22 Na 129, Cl 95, both stable, continue salt tabs 1g BID -Cortisol testing done during hospitalization (13.8 on 08/30/22), was normal, but daughter and pt want to make sure this is followed up- defer to weekday team regarding ongoing ?Addison's testing -2/21 129, a little decreased, fluid restriction started -2/28 stable at 131 -09/21/22  Na stable at 130, continue to follow with  q48h BMPs- adjust to 8am labs -Fluid restrictions liberalized to 1538ml -3/11 Na stable at 130, monitor on weekly labs -3/15 will relax to 1800 cc 3/18 stable Na 129  16. Encephalopathy/Delirium: improved substantially  17. Loose stools likely d/t TF and abx.            -recent rectal tube, since dc'ed  -generally improved -09/30/22 LBM --mushy but formed -10/03/22 more formed BMs, LBM 10/02/22, continue monitoring LBM  3/19  18. Hypothyroidism: continue synthroid 17mcg QD  19. Chronic combined CHF, BLE  Swelling: recent 2D echo done 08/20/2022 showed apical hypokinesis, LVEF 40-45%, RV function was reduced. Intermittently diuresed during hospitalization, most recently given 40mg  IV lasix on 09/03/22 -09/04/22 noted to still have BLE swelling, start 20mg  PO lasix and monitor lytes/BP/swelling -3/5 wt a little up, monitor tend -3/6 asked nutrition to stop milk/carnation- she wasn't drinking but it was counting toward her fluid restriction -3/7 wt stable, fluid restrictions liberalized to 1526ml -09/25/22 wt stable, monitor -10/01/22 weight stable to decreased. Not sure it's real although po intake has been sporadic -10/02/22 wt uptrending but stable. Monitor  -10/03/22 wt stable, monitor Filed Weights   10/04/22 0500 10/05/22 0500 10/06/22 0439  Weight: 63.2 kg 64.8 kg 64.5 kg    20. Oral ulcer  --2/28 Orajel for oral ulcer discomfort 21.  Dry mucous membranes, may be related to anticholinergic meds cyclobenzaprine and trazodone, pain and sleep are   better so reluctant to stop at this time  22. Nausea: somewhat chronic, takes meds on empty stomach in the morning, compazine given and helped, continue PRN compazine 5-10mg , encouraged pt to have some food before meds  -3/9 Continue compazine,  advised to take meds with food in AM     LOS: 33 days A FACE TO [place] 10/06/2022, 8:30 AM

## 2022-10-06 NOTE — Progress Notes (Signed)
Patient ID: [name], female   DOB: 03-Oct-1955, 66 y.o.   MRN: [id number]  Met with pt and daughter who was present in the room to update both regarding team conference progress toward her goals and actually being upgraded to [place] level with discharge date still 3/27. Both daughter and pt are concerned regarding her wound and it being managed by RN due to do fear it will become infected and she will end up right back here in the hospital as before. Expressed would reach out to team, MD and RN care coordinator and express their concerns. Discussed hired assist and to begin hands on education for the coming week to prepare for discharge. Asked if preference for home health agency and they do not have one. Want one who can start quickly after discharge and covered by pt's insurance. They have both private duty list to start pursing caregiver. Pt felt not being listened to and daughter became upset with Mom and left the room. This worker stayed to answer pt's questions, she felt she could not ask due to worker and daughter were talking but with her also. Will message team, MD and RN care coordinator to express concerns regarding wound healing

## 2022-10-06 NOTE — Progress Notes (Signed)
Occupational Therapy Session Note  Patient Details  Name: [name] MRN: [id number] Date of Birth: 1955-12-11  Session 1  Today's Date: 10/06/2022 OT Individual Time: [id number] Individual Time Calculation (min): 45 min    Session 2  Today's Date: 10/06/2022 OT Individual Time: [id number] OT Individual Time Calculation (min): 28 min    Short Term Goals: Week 5:  OT Short Term Goal 1 (Week 5): Pt will complete a stand pivot transfer with min A OT Short Term Goal 2 (Week 5): Pt will complete toileting tasks with mod A OT Short Term Goal 3 (Week 5): Pt will complete sit > stand with min A  Skilled Therapeutic Interventions/Progress Updates:    Session 1 Pt received supine with no c/o pain, agreeable to OT session. She completed bed mobility to EOB with (S) using bed rail. She stood from EOB with CGA using RW! She completed short distance, 5 ft, with the RW with CGA. She sat to attempt and void urine, extra time given, privacy, water running to facilitate void. No success unfortunately. She had a smear of BM and was able to wipe her bottom with (S) ! Great improvement in toileting tasks. She completed sit > stand from the [facility] with min A. She used the RW and completed functional mobility back to the bed with CGA. She required min A to bring her RLE into the bed. She was left supine with all needs met.    Session 2 Pt received supine with no c/o pain, agreeable to OT session. Pt wanting to attempt and void urine one more time before cath. She came to EOB with (S) using bed rail. She stood from EOB with CGA using the RW. She completed functional mobility to the [facility] with CGA. She was unable to void. She completed functional mobility to her manual w/c (brought in from home) and sat for several minutes to assess tolerance. Discussed openness to an outing on Friday and she was agreeable. She returned to her bed and was left supine with all needs met.    Therapy  Documentation Precautions:  Precautions Precautions: Fall, Back Precaution Comments: foley Required Braces or Orthoses: Other Brace Other Brace: prevlon boots Restrictions Weight Bearing Restrictions: No Other Position/Activity Restrictions: external rotation R hip at baseline at all times, RLE shorter than LLE, pt ambulated household distances PTA, w/c for community  Therapy/Group: Individual Therapy  Curtis Sites 10/06/2022, 6:21 AM

## 2022-10-06 NOTE — Progress Notes (Addendum)
Speech Language Pathology Weekly Progress and Session Note  Patient Details  Name: [name] MRN: [id number] Date of Birth: 02/20/1956  Beginning of progress report period: September 28, 2022 End of progress report period: October 06, 2022  Today's Date: 10/06/2022 SLP Individual Time: 0930-1000 SLP Individual Time Calculation (min): 30 min  Short Term Goals: Week 5: SLP Short Term Goal 1 (Week 5): STG's = LTG's due to ELOS SLP Short Term Goal 1 - Progress (Week 5): Progressing toward goal    New Short Term Goals: Week 6: SLP Short Term Goal 1 (Week 6): STG's = LTG's due to ELOS  Weekly Progress Updates: Pt continues to demonstrate subtle improvement/progress in regards to recall, awareness, use of external aids, problem-solving, and attention. Pt is currently at Sup to Jud level for cognitive task completion, particularly as it relates to use of memory notebook. Pt and family education ongoing. Recommend continuation of ST POC during this admission, as well as f/u ST at next venue of care and 24/7 supervision and assistance upon d/c - pt and family aware.  Intensity: Minumum of 1-2 x/day, 30 to 90 minutes Frequency: 3 to 5 out of 7 days Duration/Length of Stay: 3/27 Treatment/Interventions: Cognitive remediation/compensation;Internal/external aids;Cueing hierarchy;Patient/family education;Therapeutic Activities;Functional tasks;Therapeutic Exercise   Daily Session  Skilled Therapeutic Interventions:     Pt seen this date for skilled ST intervention targeting cognitive goals outlined in care plan. Pt received awake/alert and lying semi-reclined in bed; brushing her teeth and getting ready for the day. Daughter present initially; however, left at the onset of today's session. Agreeable to intervention at bedside. Participatory throughout.   Pt demonstrated recall of previous evening events at Sup A level without use of external aids. Recalled template provided and trained on  during previous sessions with Sup A, and implemented with set-up assistance. Benefited from Sup-Min A verbal cues to write activities completed during AM therapy session. Demonstrates emergent awareness with Mod I to Sup A. Continues to be limited by social emotional factors.    Of note, pt missed 15 minutes of skilled ST intervention due to nursing care at onset of today's session (bladder scanning).    Pt left in room and in bed with all safety measures activated, call bell within reach, and all immediate needs met. Continue per current ST POC.    Pain Pt with hx of chronic pain; did not interfere with pt's participation during today's session   Therapy/Group: Individual Therapy  Gary Gabrielsen A Oliviagrace Crisanti 10/06/2022, 8:33 PM

## 2022-10-06 NOTE — Plan of Care (Signed)
  Problem: Consults Goal: RH GENERAL PATIENT EDUCATION Description See Patient Education module for education specifics. Outcome: Progressing   

## 2022-10-06 NOTE — Consult Note (Signed)
Brief Psychiatry Note  Reason for Consult: "medication evaluation" Consult by: Reesa Chew, PA-C  Attempted to see patient this morning but patient was irritable and having I&O cath. We will attempt tomorrow.   France Ravens, MD PGY2 Psychiatry Resident

## 2022-10-06 NOTE — Plan of Care (Signed)
Goals upgraded d/t patient progress  Problem: Sit to Stand Goal: LTG:  Patient will perform sit to stand in prep for activites of daily living with assistance level (OT) Description: LTG:  Patient will perform sit to stand in prep for activites of daily living with assistance level (OT) Flowsheets (Taken 10/06/2022 1217) LTG: PT will perform sit to stand in prep for activites of daily living with assistance level: (upgraded 3/20- SD) Contact Guard/Touching assist   Problem: RH Eating Goal: LTG Patient will perform eating w/assist, cues/equip (OT) Description: LTG: Patient will perform eating with assist, with/without cues using equipment (OT) Outcome: Completed/Met   Problem: RH Bathing Goal: LTG Patient will bathe all body parts with assist levels (OT) Description: LTG: Patient will bathe all body parts with assist levels (OT) Flowsheets (Taken 10/06/2022 1217) LTG: Pt will perform bathing with assistance level/cueing: (upgraded 3/20- SD) Minimal Assistance - Patient > 75%   Problem: RH Dressing Goal: LTG Patient will perform upper body dressing (OT) Description: LTG Patient will perform upper body dressing with assist, with/without cues (OT). Flowsheets (Taken 10/06/2022 1217) LTG: Pt will perform upper body dressing with assistance level of: (upgraded 3/20- SD) Supervision/Verbal cueing Goal: LTG Patient will perform lower body dressing w/assist (OT) Description: LTG: Patient will perform lower body dressing with assist, with/without cues in positioning using equipment (OT) Flowsheets (Taken 10/06/2022 1217) LTG: Pt will perform lower body dressing with assistance level of: (upgraded 3/20- SD) Contact Guard/Touching assist   Problem: RH Toileting Goal: LTG Patient will perform toileting task (3/3 steps) with assistance level (OT) Description: LTG: Patient will perform toileting task (3/3 steps) with assistance level (OT)  Flowsheets (Taken 10/06/2022 1217) LTG: Pt will perform  toileting task (3/3 steps) with assistance level: (upgraded 3/20- SD) Contact Guard/Touching assist   Problem: RH Toilet Transfers Goal: LTG Patient will perform toilet transfers w/assist (OT) Description: LTG: Patient will perform toilet transfers with assist, with/without cues using equipment (OT) Flowsheets (Taken 10/06/2022 1217) LTG: Pt will perform toilet transfers with assistance level of: (upgraded 3/20- SD) Contact Guard/Touching assist

## 2022-10-06 NOTE — Progress Notes (Signed)
Recreational Therapy Session Note  Patient Details  Name: [name] MRN: [id number] Date of Birth: 12-11-1955 Today's Date: 10/06/2022  Pain: no c/o Skilled Therapeutic Interventions/Progress Updates: Met with pt briefly today with OT to discuss the potential for community reintegration this week.  Pt expressed questions/concerns last week about her ability to communicate in social settings.  Pt agreeable to participate in an outing Friday to [employer].  [place] 10/06/2022, 3:46 PM

## 2022-10-06 NOTE — Progress Notes (Signed)
Physical Therapy Session Note  Patient Details  Name: [name] MRN: [id number] Date of Birth: October 26, 1955  Today's Date: 10/06/2022 PT Individual Time: 1355-1509 PT Individual Time Calculation (min): 74 min   Short Term Goals: Week 4:  PT Short Term Goal 1 (Week 4): STGs = LTGs  Skilled Therapeutic Interventions/Progress Updates:    Pt received awake, supine in bed and agreeable to therapy session. Threaded on pants total assist for time.Supine>sitting R EOB, HOB partially elevated and using bedrail, with supervision and increased time/effort to bring LEs over to EOB and push up to bring trunk upright. Sitting EOB with supervision, no instability, donned socks and shoes total assist for time. Sit>stand EOB>RW with min assist for lifting to stand and balance due to pt having challenge moving hands from seat to RW lacking sufficient glute activation to extend hips and bring trunk upright - had to return to sitting then stand again to gain standing balance and allow therapist to pull pants up over hips total assist. Short distance ~74ft ambulatory transfer EOB>TIS w/c using RW with CGA/light min assist for steadying/safety.  Transported to/from gym in w/c for time management and energy conservation.  Sit<>stands from TIS w/c>RW with very light min assist to initiating lifting each time, but then pt able to come to full standing with CGA.  Gait training 46ft + 38ft using RW with light min assist for steadying/balance and +2 w/c follow for pt comfort, but not needed on 2nd walk as pt able to turn and sit on mat. Pt demonstrating the following gait deviations with therapist providing the described cuing and facilitation for improvement:  - due to leg length discrepancy (L longer than R) pt continues to have primarily a step-to pattern leading with L LE; however, improving significantly with pt also improving in gait speed and endurance - pt has prolonged R stance phase with R stance bias during  gait and standing standing since this is the shorter LE  Discussed plan for outing this week with Rec Therapy and OT with pt eager to participate in stair navigation training. Transported to main gym.   Repeated stepping up/down on/off 1st 3" height step using B HR support with min assist for safety/balance but pt able to step-up without true assistance for lifting using B UE support to compensate as needed with 2 sets performing ~3-5reps leading with each LE.  Pt reporting fatigue so transported outside for ~33minutes for change in scenery to boost her mood while discussing plan for D/C next week and pt reporting she is feeling stronger and more confident with her greatest challenge also being her memory.  Transported back to room. R stand pivot TIS w/c>EOB using RW with light min assist to initiate lifting then CGA to complete transfer.  Sit>supine with light mod assist for B LE management into bed. Pt left supine in bed with needs in reach and bed alarm on.    Therapy Documentation Precautions:  Precautions Precautions: Fall, Back Precaution Comments: foley Required Braces or Orthoses: Other Brace Other Brace: prevlon boots Restrictions Weight Bearing Restrictions: No Other Position/Activity Restrictions: external rotation R hip at baseline at all times, RLE shorter than LLE, pt ambulated household distances PTA, w/c for community   Pain:  Reports generalized pain at times but no true complaint of pain throughout session.   Therapy/Group: Individual Therapy  Tawana Scale , PT, DPT, NCS, CSRS 10/06/2022, 1:00 PM

## 2022-10-06 NOTE — Progress Notes (Signed)
Patient was Transported onto [facility] at 2045, attempted to void for several minutes and was unsuccessful. Scanned for 447 and cathed for 650 mls. Education provided on importance of I/O caths q4-6 hr, education on equipment used and step by step explanation on catheterization. Pt agreeable at time but also unhappy. Requested for nursing to wait until the 6 hour mark to attempt again, pt agreeable to 3 am attempt.  At 3 am nursing attempted to transfer pt onto [facility] but patient refused, also offered to attempt to void in bed but patient also refused and stated "just do the cath". Explain to patient that we would still scan bladder and scanned for 378. Towel provided for patient to cover eyes so light in room needed for visualization would not bother as much. Explained to Lidocaine gel would be used and was agreeable but still unhappy, stated "how am I gonna do therapy in the morning when I'm being awoken and barley slept." Pt fell asleep after 2300. 2 In and out cath kits needed as first attempt was unsuccessful, patient was irritable and tearful. Asked about cath schedule and informed patient that doctors order is every 4-6 hr if greater than 350, was very upset about this and stated "was gonna leave  AMA", nursing advised against this at this time. After successful insertion and removal of cath, 450 mls were drained. Patient then stated "you're not doing that again" very angrily and tearful. Emotional support continuously attempted with patient throughout the whole process. Patient then stated "Im not gonna drink anything to not do this anymore", advised against this, support attempted, pt then requested Ice water, nursing provided and patient drank. Continued to appear with flat affect, and declined any further needs at this time.

## 2022-10-06 NOTE — Patient Care Conference (Signed)
Inpatient RehabilitationTeam Conference and Plan of Care Update Date: 10/06/2022   Time: 12:08 PM    Patient Name: [name]      Medical Record Number: [id number]  Date of Birth: 09/08/1955 Sex: Female         Room/Bed: 4W14C/4W14C-01 Payor Info: Payor: [place] / Plan: [employer] MEDICARE / Product Type: *No Product type* /    Admit Date/Time:  09/03/2022  6:18 PM  Primary Diagnosis:  Infection of lumbar spine [facility])  Hospital Problems: Principal Problem:   Infection of lumbar spine ([place]) Active Problems:   VENTRICULAR SEPTAL DEFECT, CONGENITAL   Anxiety   Diabetes mellitus, type 2 (HCC)   Acid reflux   Mild cognitive impairment   Mood disorder (HCC)   Wound dehiscence   Encephalopathy   Malnutrition of moderate degree   Bacterial meningitis   Hardware complicating wound infection ([place])   Acute blood loss anemia   Chronic combined systolic and diastolic CHF (congestive heart failure) (HCC)   Chronic midline thoracic back pain   Primary insomnia   Chronic wound   Hyperglycemia   Adjustment disorder with mixed anxiety and depressed mood    Expected Discharge Date: Expected Discharge Date: 10/13/22  Team Members Present: Physician leading conference: Dr. Jennye Boroughs Social Worker Present: Ovidio Kin, LCSW Nurse Present: Dorien Chihuahua, RN PT Present: Ginnie Smart, PT;Rosita Dechalus, PTA OT Present: Laverle Hobby, OT SLP Present: Weston Anna, SLP PPS Coordinator present : Gunnar Fusi, SLP     Current Status/Progress Goal Weekly Team Focus  Bowel/Bladder   Continent of bowel. voiding trial initiated, Cath q4-6 hr for volumes <350.   Void by self without nursing intervention.   Continue I/O cath per order.    Swallow/Nutrition/ Hydration               ADL's   (S) UB ADLs, mod A LB ADL, min A transfers with the RW, mod A toileting tasks   min-mod A overall   ADLs, transfers, standing balance and tolerance , d/c  planning    Mobility   supervision to minA bed mobility with bed features, CGA/minA sit to stand, CGA/minA x33' wiht RW (+2 WC follow)   min/modA overall  WC evaluation?, family ed, bed mobility, transfers, ambulation, endurance    Communication                Safety/Cognition/ Behavioral Observations  Supervision-Min A   Sup A   awareness, attention, memory with use of strategies, problem solving, ongoing education    Pain   C/O back pain, Pren and scheudled pain medications.   Pain <3/10   Assess Qshift and prn    Skin   Daily dressing changes on surgical back incision.   Promote healing and prevention of infection  Assess qshift and prn      Discharge Planning:  Family here daily and participates in pt's therapies and provides support. Pt is making progress and more involved in her care. Voiding trial taking place to see if can void on own. Working on DME and home health follow up   Team Discussion: Patient post infection of lumbar spine; currently with dressing changes to two wounds daily. Anticipate wounds will take months to fully heal and patient will go home with home wound care/dressing change requirements.   Patient on target to meet rehab goals: yes, currently needs supervision for upper body ADLs and min - mod assist for lower body care. Completes transfers with CGA and  able to ambulate 73' with CGA - min assist; premorbid gait disorder. Completes toileting with min assist.  Working on short tern recall and patient noted to be using strategies more effectively.   *See Care Plan and progress notes for long and short-term goals.   Revisions to Treatment Plan:  Neuro psych consult Oral abx   Teaching Needs: Safety, medications, skin care/dressing changes, transfers, toileting, etc.   Current Barriers to Discharge: Decreased caregiver support, Home enviroment access/layout, and Wound care  Possible Resolutions to Barriers: Family education HH follow up  services; including nursing for wound care     Medical Summary Current Status: osteomyelitis, infection of lumbar surgical site complicated by meningitis/ventriculitis, wound, urinary retention, pain,nausea  Barriers to Discharge: Behavior/Mood;Uncontrolled Pain;Complicated Wound  Barriers to Discharge Comments: osteomyelitis, infection of lumbar surgical site complicated by meningitis/ventriculitis, wound, urinary retention, pain,nausea Possible Resolutions to [employer] Focus: cont wound care, voiding trail, psychiatry consult   Continued Need for Acute Rehabilitation Level of Care: The patient requires daily medical management by a physician with specialized training in physical medicine and rehabilitation for the following reasons: Direction of a multidisciplinary physical rehabilitation program to maximize functional independence : Yes Medical management of patient stability for increased activity during participation in an intensive rehabilitation regime.: Yes Analysis of laboratory values and/or radiology reports with any subsequent need for medication adjustment and/or medical intervention. : Yes   I attest that I was present, lead the team conference, and concur with the assessment and plan of the team.   Dorien Chihuahua B 10/06/2022, 2:03 PM

## 2022-10-07 ENCOUNTER — Other Ambulatory Visit (HOSPITAL_COMMUNITY): Payer: Self-pay

## 2022-10-07 ENCOUNTER — Telehealth (HOSPITAL_COMMUNITY): Payer: Self-pay | Admitting: Pharmacy Technician

## 2022-10-07 DIAGNOSIS — F3175 Bipolar disorder, in partial remission, most recent episode depressed: Secondary | ICD-10-CM

## 2022-10-07 DIAGNOSIS — F319 Bipolar disorder, unspecified: Secondary | ICD-10-CM

## 2022-10-07 MED ORDER — BUPROPION HCL ER (XL) 150 MG PO TB24
150.0000 mg | ORAL_TABLET | Freq: Every day | ORAL | Status: DC
  Administered 2022-10-07 – 2022-10-13 (×7): 150 mg via ORAL
  Filled 2022-10-07 (×7): qty 1

## 2022-10-07 MED ORDER — MEDIHONEY WOUND/BURN DRESSING EX PSTE
1.0000 | PASTE | Freq: Every day | CUTANEOUS | Status: AC
  Filled 2022-10-07: qty 44

## 2022-10-07 MED ORDER — BUPRENORPHINE 5 MCG/HR TD PTWK
1.0000 | MEDICATED_PATCH | TRANSDERMAL | 0 refills | Status: DC
  Filled 2022-10-07: qty 4, 28d supply, fill #0

## 2022-10-07 NOTE — Consult Note (Signed)
[place] [facility] Consult   Reason for Consult:  Depression and medication management Referring Physician:  Dr. Curlene Dolphin Patient Identification: [name] MRN:  [id number] Principal Diagnosis: Infection of lumbar spine [facility]) Diagnosis:  Principal Problem:   Infection of lumbar spine ([place]) Active Problems:   VENTRICULAR SEPTAL DEFECT, CONGENITAL   Anxiety   Diabetes mellitus, type 2 (HCC)   Acid reflux   Mild cognitive impairment   Mood disorder (HCC)   Wound dehiscence   Encephalopathy   Malnutrition of moderate degree   Bacterial meningitis   Hardware complicating wound infection ([place])   Acute blood loss anemia   Chronic combined systolic and diastolic CHF (congestive heart failure) (HCC)   Chronic midline thoracic back pain   Primary insomnia   Chronic wound   Hyperglycemia   Adjustment disorder with mixed anxiety and depressed mood   Bipolar disorder, in partial remission, most recent episode depressed ([place])   Total Time spent with patient: 1 hour  Subjective:   [name] is a 67 y.o. female patient admitted with Infection of Lumbar Spine.  HPI:   Patient seen laying in hospital bed accompanied by her children this afternoon on my approach. She reports that she is currently in the hospital due to an infection of her spine and difficulty with walking. She states that she has been feeling increasingly depressed and anxious regarding her discharge planning and all of the things that she needs to get figured out before she goes home regarding therapy, home health, and mental health treatment.  The patient and her children report that the patient had an episode of delirium recently due to being overly medicated. Her children stopped all of the patient's psychiatric medication and her delirium resolved. They report the patient being 700 mg of Seroquel and other psychotropic agents that they can't recall. She is currently taking Luvox 100 mg PO QHS,  Gabapentin 100 mg PO TID, Lamictal 200 mg PO daily, Aricept 10 mg PO daily and Trazodone 100 mg PO QHS. She has been tolerating these medications but she reports that she has been feeling increasingly depressed and more irritable.   We discussed starting the patient back on Wellbutrin. She was interested in restarting this medication because it helped with her mood and energy.   Past Psychiatric History: See above  Risk to Self:   No Risk to Others:   No Prior Inpatient Therapy:   No Prior Outpatient Therapy:   No  Past Medical History:  Past Medical History:  Diagnosis Date   Anemia    Anxiety    Arthritis    Bipolar disorder ([place])    Depression    GERD (gastroesophageal reflux disease)    Heart murmur    "related to VSD"   High cholesterol    History of blood transfusion    "related to OR" (08/19/2016)   History of hiatal hernia    Hypertension    Hyperthyroidism    Mild cognitive impairment 09/06/2018   Paroxysmal ventricular tachycardia (HCC)    Type II diabetes mellitus ([place])    UTI (urinary tract infection)    being treated with Keflex   Ventricular septal defect     Past Surgical History:  Procedure Laterality Date   ABDOMINAL HYSTERECTOMY     BACK SURGERY     CARDIAC CATHETERIZATION N/A 06/23/2015   Procedure: Left Heart Cath and Coronary Angiography;  Surgeon: Larey Dresser, MD;  Location: [facility];  Service: Cardiovascular;  Laterality:  N/A;   CARDIAC CATHETERIZATION  1960   "VSD was so small; didn't need repaired"   EXAM UNDER ANESTHESIA WITH MANIPULATION OF HIP Right 06/02/2014   dr Rhona Raider   FRACTURE SURGERY     HERNIA REPAIR     HIP CLOSED REDUCTION Right 06/02/2014   Procedure: CLOSED MANIPULATION HIP;  Surgeon: Hessie Dibble, MD;  Location: [facility];  Service: Orthopedics;  Laterality: Right;   JOINT REPLACEMENT     JOINT REPLACEMENT     POSTERIOR LUMBAR FUSION 4 LEVEL N/A 06/26/2022   Procedure: Lumbar One To Lumbar Five Posterior  Instrumented Fusion;  Surgeon: Judith Part, MD;  Location: [facility];  Service: Neurosurgery;  Laterality: N/A;   REFRACTIVE SURGERY Bilateral    SHOULDER ARTHROSCOPY Right    SHOULDER OPEN ROTATOR CUFF REPAIR Right    SPINAL FUSION  1996   "t10 down to my coccyx   SPINE HARDWARE REMOVAL     TOTAL ABDOMINAL HYSTERECTOMY     TOTAL HIP ARTHROPLASTY Right 05/10/2014   [place]      by dr Harrell Gave [place]   TOTAL KNEE ARTHROPLASTY Left    TOTAL SHOULDER ARTHROPLASTY Left 08/19/2016   Procedure: TOTAL SHOULDER ARTHROPLASTY;  Surgeon: Tania Ade, MD;  Location: [facility];  Service: Orthopedics;  Laterality: Left;  Left total shoulder replacement   Family Psychiatric  History: Daughter ADHD Social History:  Social History   Substance and Sexual Activity  Alcohol Use No     Social History   Substance and Sexual Activity  Drug Use No    Social History   Socioeconomic History   Marital status: Married    Spouse name: Not on file   Number of children: Not on file   Years of education: Not on file   Highest education level: Master's degree (e.g., MA, MS, MEng, MEd, MSW, MBA)  Occupational History   Occupation: Product manager: SELF-EMPLOYED    Comment: former  Tobacco Use   Smoking status: Former    Years: 50    Types: Cigarettes   Smokeless tobacco: Never   Tobacco comments:    03/30/21 smokes 2 cigs daily.      08/06/22 Janett Billow stated that the patient is not smoking while at [facility] for rehab. [facility]  Vaping Use   Vaping Use: Never used  Substance and Sexual Activity   Alcohol use: No   Drug use: No   Sexual activity: Not on file    Comment: Hysterectomy  Other Topics Concern   Not on file  Social History Narrative   Right handed   Caffeine 2-3 cups daily    Lives at home with husband    Social Determinants of Health   Financial Resource Strain: Not on file  Food Insecurity: No Food Insecurity (06/27/2022)   Hunger Vital Sign    Worried About  Running Out of Food in the Last Year: Never true    Ran Out of Food in the Last Year: Never true  Transportation Needs: No Transportation Needs (06/27/2022)   PRAPARE - Hydrologist (Medical): No    Lack of Transportation (Non-Medical): No  Physical Activity: Not on file  Stress: Not on file  Social Connections: Not on file   Additional Social History:    Allergies:  No Known Allergies  Labs: No results found for this or any previous visit (from the past 48 hour(s)).  Current Facility-Administered Medications  Medication Dose Route Frequency Provider  Last Rate Last Admin   0.9 %  sodium chloride infusion   Intravenous PRN Jennye Boroughs, MD 10 mL/hr at 09/28/22 1813 New Bag at 09/28/22 1813   0.9 %  sodium chloride infusion   Intravenous PRN Jennye Boroughs, MD 10 mL/hr at 09/28/22 1640 New Bag at 09/28/22 1640   acetaminophen (TYLENOL) tablet 325-650 mg  325-650 mg Oral Q6H PRN Jennye Boroughs, MD   650 mg at 09/23/22 0850   alum & mag hydroxide-simeth (MAALOX/MYLANTA) 200-200-20 MG/5ML suspension 30 mL  30 mL Oral Q4H PRN Bary Leriche, PA-C   30 mL at 10/02/22 1527   benzocaine (ORAJEL) 10 % mucosal gel   Mouth/Throat QID PRN Jennye Boroughs, MD       bisacodyl (DULCOLAX) suppository 10 mg  10 mg Rectal Daily PRN Love, Pamela S, PA-C       buprenorphine (BUTRANS) 5 MCG/HR 1 patch  1 patch Transdermal Weekly Bary Leriche, PA-C   1 patch at 10/04/22 1721   butalbital-acetaminophen-caffeine (FIORICET) 50-325-40 MG per tablet 1 tablet  1 tablet Oral Q6H PRN Jennye Boroughs, MD   1 tablet at 10/01/22 0754   carvedilol (COREG) tablet 25 mg  25 mg Oral BID WC Jennye Boroughs, MD   25 mg at 10/07/22 [id number]   cyclobenzaprine (FLEXERIL) tablet 10 mg  10 mg Oral TID [name], MD   10 mg at 10/07/22 [id number]   diclofenac Sodium (VOLTAREN) 1 % topical gel 2 g  2 g Topical QID Jennye Boroughs, MD   2 g at 10/07/22 [id number]   diphenhydrAMINE (BENADRYL)  12.5 MG/5ML elixir 12.5-25 mg  12.5-25 mg Oral Q6H PRN Love, Pamela S, PA-C       diphenoxylate-atropine (LOMOTIL) 2.5-0.025 MG per tablet 1-2 tablet  1-2 tablet Oral QID PRN Love, Pamela S, PA-C       donepezil (ARICEPT) tablet 10 mg  10 mg Oral Daily Reesa Chew S, PA-C   10 mg at 10/07/22 0902   doxycycline (VIBRA-TABS) tablet 100 mg  100 mg Oral Q12H Reome, Earle J, RPH   100 mg at 10/07/22 0902   enoxaparin (LOVENOX) injection 40 mg  40 mg Subcutaneous Q24H Love, Pamela S, PA-C   40 mg at 10/06/22 2128   famotidine (PEPCID) tablet 20 mg  20 mg Oral BID Reesa Chew S, PA-C   20 mg at 10/07/22 [id number]   feeding supplement (ENSURE ENLIVE / ENSURE PLUS) liquid 237 mL  237 mL Oral TID BM Jennye Boroughs, MD   237 mL at 10/06/22 1558   fiber (NUTRISOURCE FIBER) 1 packet  1 packet Oral BID Bary Leriche, PA-C   1 packet at 09/18/22 0813   fluvoxaMINE (LUVOX) tablet 100 mg  100 mg Oral QHS Love, Pamela S, PA-C   100 mg at 10/06/22 2130   furosemide (LASIX) tablet 20 mg  20 mg Oral Daily Bary Leriche, PA-C   20 mg at 10/07/22 [id number]   gabapentin (NEURONTIN) capsule 100 mg  100 mg Oral TID Izora Ribas, MD   100 mg at 10/07/22 [id number]   [name]'s butt cream   Topical Daily Bary Leriche, PA-C   Given at 10/07/22 [id number]   guaiFENesin-dextromethorphan (ROBITUSSIN DM) 100-10 MG/5ML syrup 5-10 mL  5-10 mL Oral Q6H PRN Love, Pamela S, PA-C       ipratropium-albuterol (DUONEB) 0.5-2.5 (3) MG/3ML nebulizer solution 3 mL  3 mL Nebulization Q6H PRN Love, Pamela S, PA-C       labetalol (  NORMODYNE) injection 10 mg  10 mg Intravenous Q2H PRN Love, Pamela S, PA-C       lamoTRIgine (LAMICTAL) tablet 200 mg  200 mg Oral Daily Love, Pamela S, PA-C   200 mg at 10/07/22 [id number]   leptospermum manuka honey (MEDIHONEY) paste 1 Application  1 Application Topical Daily Jennye Boroughs, MD       levofloxacin [facility] 2) tablet 750 mg  750 mg Oral Daily Reome, Earle J, RPH   750 mg at 10/07/22 [id number]   levothyroxine  (SYNTHROID) tablet 88 mcg  88 mcg Oral Q0600 Bary Leriche, PA-C   88 mcg at 10/07/22 0602   lidocaine (LIDODERM) 5 % 2 patch  2 patch Transdermal Q[address], [place], [place]   2 patch at 10/05/22 1242   lidocaine (XYLOCAINE) 2 % jelly   Topical PRN Love, Pamela S, PA-C       losartan (COZAAR) tablet 25 mg  25 mg Oral QHS [address] [place], [place]   25 mg at 10/06/22 2129   melatonin tablet 5 mg  5 mg Oral QHS PRN Jennye Boroughs, MD   5 mg at 10/06/22 2326   menthol-cetylpyridinium (CEPACOL) lozenge 3 mg  1 lozenge Oral PRN Love, Pamela S, PA-C       metFORMIN (GLUCOPHAGE) tablet 500 mg  500 mg Oral BID WC Raulkar, Clide Deutscher, MD   500 mg at 10/07/22 [id number]   multivitamin with minerals tablet 1 tablet  1 tablet Oral Daily Bary Leriche, PA-C   1 tablet at 10/07/22 [id number]   Oral care mouth rinse  15 mL Mouth Rinse PRN Love, Pamela S, PA-C       oxyCODONE (Oxy IR/ROXICODONE) immediate release tablet 5 mg  5 mg Oral Q6H Love, Pamela S, PA-C   5 mg at 10/07/22 1203   oxyCODONE-acetaminophen (PERCOCET/ROXICET) 5-325 MG per tablet 1 tablet  1 tablet Oral Q4H PRN Bary Leriche, PA-C   1 tablet at 10/07/22 0903   potassium chloride (KLOR-CON M) CR tablet 30 mEq  30 mEq Oral BID [address], [place], [place]   30 mEq at 10/07/22 [id number]   prochlorperazine (COMPAZINE) tablet 5-10 mg  5-10 mg Oral Q6H PRN Bary Leriche, PA-C   10 mg at 10/05/22 1308   Or   prochlorperazine (COMPAZINE) injection 5-10 mg  5-10 mg Intramuscular Q6H PRN Love, Pamela S, PA-C       Or   prochlorperazine (COMPAZINE) suppository 12.5 mg  12.5 mg Rectal Q6H PRN Love, Pamela S, PA-C       saccharomyces boulardii (FLORASTOR) capsule 250 mg  250 mg Oral BID Love, Pamela S, PA-C   250 mg at 10/07/22 [id number]   sodium chloride flush (NS) 0.9 % injection 10-40 mL  10-40 mL Intracatheter PRN Bary Leriche, PA-C   10 mL at 09/24/22 [id number]   sodium chloride tablet 1 g  1 g Oral BID WC LoveIvan Anchors, PA-C   1 g at 10/07/22 [id number]   sodium phosphate (FLEET)  7-19 GM/118ML enema 1 enema  1 enema Rectal Once PRN Love, Pamela S, PA-C       tamsulosin (FLOMAX) capsule 0.8 mg  0.8 mg Oral QPC supper Jennye Boroughs, MD   0.8 mg at 10/06/22 1842   traZODone (DESYREL) tablet 100 mg  100 mg Oral QHS Love, Pamela S, PA-C   100 mg at 10/06/22 2128   Psychiatric Specialty Exam:  Presentation  General Appearance:  Appropriate for Environment  Eye Contact: Good  Speech: Normal  Rate; Clear and Coherent  Speech Volume: Normal  Handedness:No data recorded  Mood and Affect  Mood: Depressed  Affect: Other (comment) (Irritable)   Thought Process  Thought Processes: Linear  Descriptions of Associations:Intact  Orientation:Full (Time, Place and Person)  Thought Content:Logical  History of Schizophrenia/Schizoaffective disorder:No data recorded Duration of Psychotic Symptoms:No data recorded Hallucinations:Hallucinations: None  Ideas of Reference:None  Suicidal Thoughts:Suicidal Thoughts: No  Homicidal Thoughts:Homicidal Thoughts: No   Sensorium  Memory: Recent Good; Remote Good  Judgment: Good  Insight: Good   Executive Functions  Concentration: Good  Attention Span: Good  Recall: Good  Fund of Knowledge: Good  Language: Good   Psychomotor Activity  Psychomotor Activity:No data recorded  Assets  Assets:No data recorded  Sleep  Sleep: Sleep: Fair   Physical Exam: Physical Exam ROS Blood pressure 122/81, pulse 94, temperature 98.1 F (36.7 C), temperature source Oral, resp. rate 19, height 5\' 1"  (1.549 m), weight 60.5 kg, SpO2 98 %. Body mass index is 25.2 kg/m.  Treatment Plan Summary: Bipolar Disorder MRE Depressed -Continue Lamictal 200 mg PO daily -Continue Luvox 100 mg PO daily  -Continue Trazodone 100 mg PO QHS -Restart Wellbutrin XL 150 mg PO daily  Anxiety Unspecified -Continue Gabapentin 100 mg PO TID  Opiate Use Disorder in complete sustained remission  Alcohol Use Disorder  in complete sustained remission  Disposition:  -Psychiatry will continue to follow up on medication management. If the patient is tolerating the medication and her mood becomes stable we will likely sign off at that time. -Patient does not meet criteria for IVC  Pecolia Ades, DO 10/07/2022 12:42 PM

## 2022-10-07 NOTE — Progress Notes (Signed)
Speech Language Pathology Weekly Progress and Session Note  Patient Details  Name: GISSELE MAKRIS MRN: [id number] Date of Birth: [birth year]  Beginning of progress report period: September 30, 2022 End of progress report period: October 07, 2022  Today's Date: 10/07/2022 SLP Individual Time: 1115-1200 SLP Individual Time Calculation (min): 45 min  Short Term Goals: Week 5: SLP Short Term Goal 1 (Week 5): STG's = LTG's due to ELOS SLP Short Term Goal 1 - Progress (Week 5): Progressing toward goal    New Short Term Goals: Week 6: SLP Short Term Goal 1 (Week 6): STG's = LTG's due to ELOS  Weekly Progress Updates: Pt has demonstrated some subtle progress towards meeting short-term and long-term goals. Pt and family education ongoing. Continue to assist pt with organization of memory notebook to aid in efficiency of information retrieval. Recommend 24/7 supervision and assistance at time of d/c, as well as short-course of follow-up ST intervention to assist with generalization of education acquired during this IPR admission.   Intensity: Minumum of 1-2 x/day, 30 to 90 minutes Frequency: 3 to 5 out of 7 days Duration/Length of Stay: 3/27 Treatment/Interventions: Cognitive remediation/compensation;Internal/external aids;Cueing hierarchy;Patient/family education;Therapeutic Activities;Functional tasks;Therapeutic Exercise  Daily Session  Skilled Therapeutic Interventions:  Pt seen this date for skilled ST intervention targeting cognitive goals outlined in care plan. Pt received awake/alert and lying semi-reclined in bed. Recently finished with PT. Son present for session. Agreeable to intervention at bedside. Participatory throughout.  SLP facilitated today's session by providing overall Sup A for use of memory notebook to recall recent events. Advocating for her needs at the Mod I level. Furthermore, SLP provided emotional support, validation, and active listening given pt's frustrations with  current deficits and circumstances.   SLP educated pt on use of "goals" and "accomplishments" subtitles in her memory notebook to track her progress and improve her motivation and participation overall - pt states she frequently does not know what she is working towards and "forgets" all that she has accomplished then becomes "down" on herself.   Additionally, pt mentioned that she is unable to maintain organizational methods provided during previous session due to dislike. Provided pt with alternative organization methods, with pt voicing that she would like to trial use of binder tabs to organize thoughts and ideas; will plan to assist pt in trialing new system in upcoming session(s). Pt voiced appreciation of care and assistance.   Pt left in room and in bed with all safety measures activated and call bell within reach, and all immediate needs met. Son remained at bedside. Continue per current ST POC.     Pain No pain reported this session; NAD  Therapy/Group: Individual Therapy  Tomy Khim A Braylyn Kalter 10/07/2022, 12:26 PM

## 2022-10-07 NOTE — Progress Notes (Signed)
PROGRESS NOTE   Subjective/Complaints:  She continues to require IC. Seen by [facility] this AM- pt reported discrepancy with frequency wound care . She was seen by psych today.  . ROS: +back pain-managed, +nausea-improved . + Bladder fullness sensation denies fevers, chills, CP, SOB, abd pain, D/C, new/worsening paresthesias/weakness, or any other complaints at this time.     Objective:   No results found. No results for input(s): "WBC", "HGB", "HCT", "PLT" in the last 72 hours.      No results for input(s): "NA", "K", "CL", "CO2", "GLUCOSE", "BUN", "CREATININE", "CALCIUM" in the last 72 hours.    Intake/Output Summary (Last 24 hours) at 10/07/2022 0825 Last data filed at 10/06/2022 2346 Gross per 24 hour  Intake 720 ml  Output 1700 ml  Net -980 ml         Physical Exam: Vital Signs Blood pressure 122/81, pulse 94, temperature 98.1 F (36.7 C), temperature source Oral, resp. rate 19, height 5\' 1"  (1.549 m), weight 60.5 kg, SpO2 98 %.   Constitutional:  thin, NAD, lying in bed HENT: [place]/AT,  MMM today  Eyes: Conjugate gaze conjunctiva clear, PERRL Neck: Trach site well healed Cardiovascular: RRR, soft murmur heard (VSD), no pedal edema Pulmonary: CTAB no w/r/r, no increased WOB Abdominal: Soft, nontender, nondistended, positive bowel sounds/normal active Genitourinary:    Comments:Sacral wound dressing clean  foley in place-clear yellow urine Ext: no clubbing, cyanosis, or edema Psych: a little agitated  Skin: upper back wound just dressed. Did not re-open dressing today. Lower back wound covered.  PRIOR EXAM: Neurological: Follows commands, some STM deficits. Decreased insight still Alert and oriented x3, no hypertonia noted, CN 2-12 grossly intact. Motor 4/4 UE and 3-4/5 LE prox to distal--stable to improved MSK: LB  remains tender. Tenderness R chest wall, mid-low back    Assessment/Plan: 1. Functional  deficits which require 3+ hours per day of interdisciplinary therapy in a comprehensive inpatient rehab setting. Physiatrist is providing close team supervision and 24 hour management of active medical problems listed below. Physiatrist and rehab team continue to assess barriers to discharge/monitor patient progress toward functional and medical goals  Care Tool:  Bathing  Bathing activity did not occur: Refused           Bathing assist       Upper Body Dressing/Undressing Upper body dressing   What is the patient wearing?: Hospital gown only    Upper body assist Assist Level: Total Assistance - Patient < 25%    Lower Body Dressing/Undressing Lower body dressing      What is the patient wearing?: Hospital gown only     Lower body assist Assist for lower body dressing: Total Assistance - Patient < 25%     Toileting Toileting Toileting Activity did not occur Landscape architect and hygiene only): N/A (no void or bm) (using catheter and rectal tube)  Toileting assist Assist for toileting: Dependent - Patient 0%     Transfers Chair/bed transfer  Transfers assist  Chair/bed transfer activity did not occur: Safety/medical concerns  Chair/bed transfer assist level: Maximal Assistance - Patient 25 - 49%     Locomotion Ambulation   Ambulation assist  Ambulation activity did not occur: Safety/medical concerns          Walk 10 feet activity   Assist  Walk 10 feet activity did not occur: Safety/medical concerns        Walk 50 feet activity   Assist Walk 50 feet with 2 turns activity did not occur: Safety/medical concerns         Walk 150 feet activity   Assist Walk 150 feet activity did not occur: Safety/medical concerns         Walk 10 feet on uneven surface  activity   Assist Walk 10 feet on uneven surfaces activity did not occur: Safety/medical concerns         Wheelchair     Assist Is the patient using a wheelchair?:  Yes Type of Wheelchair: Manual Wheelchair activity did not occur: Safety/medical concerns         Wheelchair 50 feet with 2 turns activity    Assist    Wheelchair 50 feet with 2 turns activity did not occur: Safety/medical concerns       Wheelchair 150 feet activity     Assist  Wheelchair 150 feet activity did not occur: Safety/medical concerns       Blood pressure 122/81, pulse 94, temperature 98.1 F (36.7 C), temperature source Oral, resp. rate 19, height 5\' 1"  (1.549 m), weight 60.5 kg, SpO2 98 %.  Medical Problem List and Plan: 1. Functional deficits secondary to osteomyelitis, infection of lumbar surgical site complicated by meningitis/ventriculitis.             -pt also with significant deconditioning             -patient may not yet shower -ELOS/Goals: extended to 3/27, pt agreeable. Will proceed with that ELOS in mind.  Min/Mod A goals with PT, OT, SLP  -Continue CIR therapies including PT, OT, and SLP   2.  Antithrombotics: -DVT/anticoagulation:  Pharmaceutical: Lovenox 40mg  QD             -antiplatelet therapy: N/a  3. Pain Management: Butrans 35mcg/hr patch for pain control w/ Flexeril 5mg  TID, Tylenol PRN, Oxycodone 7.5mg  q4h PRN, Gabapentin 100mg  TID  -2/26 tylenol PRN for HA -2/28 Fioricet ordered PRN headache -09/17/22 will change PRN oxycodone 2.5mg  to percocet 5-325mg , consider increase gabapentin to help with pain and HA- make changes gradually due to hx of encephalopathy  -3/7 Voltaren gel for L shoulder pain -3/8   lidocaine for chest wall tenderness started -3/15- continue flexeril  10mg  tid -She's using oxycodone 5mg  3-4x daily, butrans ptch 71mcg/hr per week as well as gabapentin tid and tylenol and butalbital prn -10/02/22 pain overall managed but breakthrough pain happening more and not as well managed, considered Tramadol but this would not be beneficial per pharmacy due to her butrans patch. Don't want to go up on Oxycodone if we can avoid  it; pt agreeable to tracking pain over the weekend, and discussing with weekday team whether longer acting opioid would be more beneficial with shorter acting PRNs; if pain uncontrolled this weekend, may switch. Monitor for now.  -Ordered lidoderm patches x2 for low back to see if we can get relief that way -10/03/22 pain doing some better, will ask primary team regarding pain regimen but for now she's doing alright.  -3/18 continue current regimen for now, asked nursing to replace butrans patch that has fallen off   4. Mood/Behavior/Sleep: LCSW to follow for evaluation and support.             --  Trazodone 100mg  QHS and melatonin 5mg  QHS PRN for  insomnia/mood  -09/04/22 placed sign on door to minimize interruptions 11p-5a; changed CBG checks as below to minimize interruptions             -antipsychotic agents:  Luvox 100mg  QHS and Lamictal 200mg  QD  -Continue Aricept 10mg  QD  -Seen by neuropsych-appreciate assistance  -3/1 increase melatonin to 10mg  dose QHS PRN  -3/4 decrease melatonin back to 5mg  dose QHS PRN -3/11 melatonin 5mg  working reasonably well  -3/15 seems to be dealing with extended LOS a bit better today   -team to continue to provide ego support   -10/02/22 better spirits! Ok with extended LOS  -10/04/22 discussed with Dr. Lovette Cliche, psychiatry consult was placed regarding psychiatric medications, depression, anxiety  3/19 pt refused psych consult- psych to try again tomorrow  3/21 Pt with hx of bipolar Disorder ,seen by psychiatry today, psych rec restart wellbutrin Xl 150mg  daily- order placed  5. Neuropsych/cognition: This patient is not fully capable of making decisions on her own behalf. -reviewed cognitive recovery with patient this week. May take until this summer to plateau. Had some mild memory deficits PTA as well.   6. Skin/Wound Care: WOC to follow for wound VAC changes --MASD managed by fecal system-- flexiseal clogged 09/04/22 AM but nursing states it's now working;  monitor--might be able to d/c soon? -2/21 WOC stopped VAC, start aquacel and dressing -see flexiseal discussion below, #17 -2/27 WOC following, slough pulled away revealing 2cm deep defect -2/29 Spoke with Dr. Zada Finders today about wound depth, continue local wound care- continue to monitor, continue abx per ID,  no plan for procedure at this time -3/4 Seen by [place]- continue aquacel, abd pad/tape, apprecaite assistance -3/7 WOC rec medihoney, fill wound with moistened gauza, distal portion of wound to be backed with silver hydrofiber, appreciate assistance -3/8 Dr. Zada Finders contacted about wound, feels like this is doing well overall, continue current wound care orders, appreciate assistance -3/14-15 changed dressing to collagenase to see if we can get better debridement of fibronecrotic tissue. Small area really needs a bit of debridement  -I contacted NS re: f/u wound next week -3/21 seen by [facility] today, recommending consideration of CSWD by NSGY, will discuss with NSGY  7. Fluids/Electrolytes/Nutrition: Monitor I/O. Continue tube feeds-->likely contributing to diarrhea             -monitor intake closely, continue vitamins -2/19 discontinue core track, Consult dietician -2/27 decrease BMP to every other day -3/14--change labs to weekly -3/15 may keep PICC for weekend and until Monday AM lab draws. If all remains stable, labs ok, then we can remove PICC -10/02/22 overnight PICC discomfort, no blood return, pt wanted it removed so PICC team removed it; if further IV needs occur, would need PIV  8.Sepsis w/ osteomyelitis/meningitis/encephalitis: Continue Cefepime 2g q8h, Vanc 1250mg  q12h, and Flagyl 500mg  q8h for at least 6 weeks --  Vanc+Cefepime complete. Metronidazole has been discontinued as of 09/16/22 (was primarily for PNA, no longer needed) -3/13 --reviewed repeat MRI of brain with resolution! Spoke with NS -3/15 converting to levaquin 750mg  QD and doxycycline 100mg  BID today -f/u  with Dr. [name] scheduled for 3/25--since she'll be here, he or partner can see while in rehab.  -3/19 we will contact Dr. Drucilla Schmidt to see if any alternative antibiotics are available due to nausea -3/20 Dr [name] recommended continue doxycyline, advised pt to take medications with food to minimize nausea -3/21 Pt would like to know  if ID wound like to see her inpatient since she has f/u day after discharge scheduled, will contact Dr Zada Finders   9. H/o PVT:  Monitor BP/heart rate TID--on coreg 12.5mg  BID>>up to 25mg  BID as of 09/10/22, Labetalol 10mg  q2h PRN  -2/23  BP has been a little above goal, increase coreg dose to 25mg  BID  -09/25/22 BP/HR with fair control, continue to monitor -09/26/22 BP running high, restarted Losartan 25mg  QHS (previously on 100mg ) -3/21 BP well controlled     10/07/2022    5:00 AM 10/06/2022    9:18 PM 10/06/2022    9:14 PM  Vitals with BMI  Weight 133 lbs 6 oz 145 lbs 1 oz   Systolic   [id number]  Diastolic   81  Pulse   94      10. Urinary retention: Has failed 4 voiding trails. Has had foley since 12/23             -maintain foley cath, foley mgt per protocol -- Continue Flomax 0.4mg  qPM. Will d/c urecholine.  -10/02/22 pt and daughter curious when TOV might occur; hoping to get her stronger and able to get to [facility] better before doing so; discussed that this will be a weekday team discussion, may end up going home with it -3/19 voiding trial today -3/20 Has required IC, continue voiding trial, increase flomax to 0.8mg ,  may need f/u with urology -3/21 may need restart foley, she continues to require IC  11. VDRF s/p trach: Tolerating PMSV well -I ordered downsizing to CFS # 4 today--tolerated well -continue ATC w/ pulmonary hygiene -begin day time plugging of trach tomorrow. Decannulate soon. -Trial of capping with respiratory-  can trial 24 hour capping today -2/22 Leave trach uncapped tonight -2/28 plan for decannulation today, has had trach capped for  >72hours -2/29 Lurline Idol was  removed  -doesn't need bandaid anymore on trach site   12. T2DM: Monitor BS ac/hs and use SSI for elevated BS.               --continue liberalized diet for now. -3/15 no longer checking cbgs per pt/family request although am serum glucose was elevated yesterday-- - resumed metformin 500mg   bid  -consider changing to CM diet at some point -3/18 glucose stable 104   13. Hypokalemia: Recurrent likely due to diarrhea/intake.  -09/18/22 stable K+ 4.2 on 09/17/22, wants to change to BID dosing so switched Klor to 37mEq BID (still will have 21mEq total/day)  -3/18 k+ stable at 4.1  14. Anemia of critical illness: Hgb 7.5-8.9 range -stable at 9.9---f/u 10/04/22    Latest Ref Rng & Units 10/04/2022    7:38 AM 09/27/2022    5:28 AM 09/20/2022    3:44 AM  CBC  WBC 4.0 - 10.5 K/uL 7.1  8.0  7.3   Hemoglobin 12.0 - 15.0 g/dL 10.4  9.9  10.1   Hematocrit 36.0 - 46.0 % 31.5  31.6  31.7   Platelets 150 - 400 K/uL 475  411  419      15. Hyponatremia/hypochloremia: I -09/04/22 Na 129, Cl 95, both stable, continue salt tabs 1g BID -Cortisol testing done during hospitalization (13.8 on 08/30/22), was normal, but daughter and pt want to make sure this is followed up- defer to weekday team regarding ongoing ?Addison's testing -2/21 129, a little decreased, fluid restriction started -2/28 stable at 131 -09/21/22 Na stable at 130, continue to follow with  q48h BMPs- adjust to 8am labs -Fluid restrictions liberalized to  1559ml -3/11 Na stable at 130, monitor on weekly labs -3/15 will relax to 1800 cc 3/18 stable Na 129  16. Encephalopathy/Delirium: improved substantially  17. Loose stools likely d/t TF and abx.            -recent rectal tube, since dc'ed  -generally improved -09/30/22 LBM --mushy but formed -10/03/22 more formed BMs, LBM 10/02/22, continue monitoring LBM  3/19  18. Hypothyroidism: continue synthroid 28mcg QD  19. Chronic combined CHF, BLE Swelling: recent 2D echo  done 08/20/2022 showed apical hypokinesis, LVEF 40-45%, RV function was reduced. Intermittently diuresed during hospitalization, most recently given 40mg  IV lasix on 09/03/22 -09/04/22 noted to still have BLE swelling, start 20mg  PO lasix and monitor lytes/BP/swelling -3/5 wt a little up, monitor tend -3/6 asked nutrition to stop milk/carnation- she wasn't drinking but it was counting toward her fluid restriction -3/7 wt stable, fluid restrictions liberalized to 1581ml -09/25/22 wt stable, monitor -10/01/22 weight stable to decreased. Not sure it's real although po intake has been sporadic -10/02/22 wt uptrending but stable. Monitor  -3/21 wt up a little, monitor tend [facility] Weights   10/06/22 0439 10/06/22 2118 10/07/22 0500  Weight: 64.5 kg 65.8 kg 60.5 kg    20. Oral ulcer  --2/28 Orajel for oral ulcer discomfort  21.  Dry mucous membranes, may be related to anticholinergic meds cyclobenzaprine and trazodone, pain and sleep are   better so reluctant to stop at this time  22. Nausea: somewhat chronic, takes meds on empty stomach in the morning, compazine given and helped, continue PRN compazine 5-10mg , encouraged pt to have some food before meds  -3/9 Continue compazine,  advised to take meds with food in AM     LOS: 34 days A FACE TO [place] 10/07/2022, 8:25 AM

## 2022-10-07 NOTE — Telephone Encounter (Signed)
Patient Advocate Encounter  Prior Authorization for Buprenorphine 5MCG/HR weekly patches  has been approved.    PA# [id number] Insurance [place] Electronic Request Form Effective dates: 10/07/2022 through 10/07/2023      Lyndel Safe, [place] Patient Advocate Specialist [place] Patient Advocate Team Direct Number: [phone]  Fax: [phone]

## 2022-10-07 NOTE — Consult Note (Addendum)
[place] Nurse wound follow up Arrived to see patient; reports discrepancies in wound care frequency. I have communicated with day time nurse and sent a message to director.    Wound type: surgical  Measurement: 1.5cm x 0.5cm x 1.0cm  Wound [id number] slough; loose in center of wound bed; may benefit from CSWD. Requested rehab MD to consult neurosurgery for this Drainage (amount, consistency, odor) yellow, non purulent  Periwound: macerated, DC covering the foam with tegaderm as this limits the transpiration of drainage  Dressing procedure/placement/frequency: Apply Medihoney to the open proximal portion of the wound bed, pack with small piece of 2x2 gauze, moisten with saline. Keeping gauze off of the periwound. Change DAILY Top with foam dressing Cover healed portion of distal surgical wound with foam; ok to change every 3 days.   Discussed patient and family request/needs with Dr. Marcelina Morel and bedside primary nurse today after my visit Patient and daughter would like neurosurgeon to see patient prior to DC.  I have recommenced scheduling FU appointment with neurosurgeon.  Discussed frequency of wound care and updated wound care orders.  3. Discussed family request for video of wound care, would suggest this closer to DC date 4. Discussed teaching of family to perform wound care prior to DC. OK for BEDSIDE nursing, especially WTA's on the unit to teach family.   Thanks  Yoseph Haile [employer], RN,CWOCN, CNS, [place] [phone])

## 2022-10-07 NOTE — Progress Notes (Addendum)
Patient ID: REGINE GERMER, female   DOB: 1955/09/08, 67 y.o.   MRN: [id number]  Spoke with pt's husband to answer his questions and will double check benefits he reports her insurance has unlimited skilled nursing. Discussed she actually needs custodial care someone with her for example a CNA and not a Therapist, sports. This is needed to do dressing changes but can educate family on this. Informed him will check with both insurance and home health agency regarding how often the RN can see her at home. Also discussed his need to come in for education have scheduled him for Sunday 1:00-3:00 pm will let team know. When see children today will try to set up education with them also.   11:00 Saw [place]-son int he hallway to ask about setting up family education and while he was here to go to therapies with Mom. He voiced will talk with sister and get back with this worker

## 2022-10-07 NOTE — Progress Notes (Signed)
Occupational Therapy Session Note  Patient Details  Name: BUENA MCKELLER MRN: [id number] Date of Birth: Jul 20, 1955  Today's Date: 10/07/2022 OT Individual Time: [id number] OT Individual Time Calculation (min): 50 min  OT Individual Time: [id number] OT Individual Time Calculation (min): 15 min   Short Term Goals: Week 5:  OT Short Term Goal 1 (Week 5): Pt will complete a stand pivot transfer with min A OT Short Term Goal 2 (Week 5): Pt will complete toileting tasks with mod A OT Short Term Goal 3 (Week 5): Pt will complete sit > stand with min A  Skilled Therapeutic Interventions/Progress Updates:     AM Session: Missed 10 minutes of skilled OT intervention d/t nursing care. Will attempt to make up time as able. Pt received supine in bed with nursing staff present in room following in/out catheterization. Pt presenting with mild pain with OT offering rest breaks and repositioning to decrease pain as RN had provided pain medications prior to OT arrival. Pt transitioned to EOB using bed features with supervision +time. Pt presenting to be upset upon OT arrival discussing psychosocial stressors and impact of hospital related stress. OT provided therapeutic support, motivation, and engaged Pt in lighthearted conversation to support moral and participation in session. Son entering room shortly after OT arrival and engaged in therapy session. Pt able to recall modified technique for donning pants and weave feet into pants with CGA for safety. Pt completed sit>stand using RW with min cues provided to recall hand placement. Upon standing pt becoming increasingly fearful reporting her legs feel weak. Returned to sitting and provided addition therapeutic support and motivation with improvement noted in moral. Pt completed second sit>stand light min A and maintained standing balance min A to facilitate hip extension with OT bringing pants to waist. Pt completed functional mobility to wc ~10 ft using RW with  CGA. Pt reporting increased fatigue and presenting anxious following ambulation stating that it has been a tough morning. Provided additional therapeutic support and engaged Pt in light level ADL grooming/hygiene tasks seated at sink to increase Pt moral with noted improvement. Pt more motivated following completed UB therband exercises in her room in wc. Pt completed bicep curls 2x10 reps with yellow therband therapist anchors and 2x10 reps R/LUE tricep extensions Pt anchored with red therband. Pt left resting in wc at end of session with call bell in reach, son present in room, and all needs met.   PM Session: Pt received laying in bed with DTR present in room upon OT arrival. Pt presenting to be uncomfortable and mildly upset reporting she is feeling pressure in her bladder and is upset that she has not been able to void despite multiple trials. Offered to assist Pt to [facility], however Pt reporting she had tried previous to session and that she would like to have an in/out catheterization performed soon. Pt receptive to OT session with DTR discussing practicing bed mobility to the L side would be really important to work on as Pt has only practiced exiting bed from R side and Pt will be exiting bed to L upon d/c. Pt began to transition into side lying for transfer, but returned to her back d/t increased pressure in bladder. Pt requesting to end session early to void bladder. Assisted positioning back in bed to decrease discomfort. Notified nursing of Pt status with nursing staff entering room at end of session. Missed 15 minutes of skilled OT treatment d/t nursing care. Will attempt to make up time as able.  Therapy Documentation Precautions:  Precautions Precautions: Fall, Back Precaution Comments: foley Required Braces or Orthoses: Other Brace Other Brace: prevlon boots Restrictions Weight Bearing Restrictions: No Other Position/Activity Restrictions: external rotation R hip at baseline at all times,  RLE shorter than LLE, pt ambulated household distances PTA, w/c for community Therapy/Group: Individual Therapy  Janey Genta 10/07/2022, 7:53 AM

## 2022-10-07 NOTE — Telephone Encounter (Signed)
Patient Advocate Encounter   Received notification that prior authorization for Buprenorphine 5MCG/HR weekly patches is required.   PA submitted on 10/07/2022 [place] [id number] Insurance [place] Electronic Request Form Status is pending       Lyndel Safe, [place] Patient Advocate Specialist [place] Patient Advocate Team Direct Number: [phone]  Fax: [phone]

## 2022-10-07 NOTE — Progress Notes (Signed)
Physical Therapy Session Note  Patient Details  Name: [name] MRN: [id number] Date of Birth: 04-26-1956  Today's Date: 10/07/2022 PT Individual Time: 1017-1100 and 1402-1430 PT Individual Time Calculation (min): 43 min and 28 min  Short Term Goals: Week 4:  PT Short Term Goal 1 (Week 4): STGs = LTGs  Skilled Therapeutic Interventions/Progress Updates:     1st Session: Pt received seated in tilt in space WC and agrees to therapy. No complaint of pain. Son present and PT provides family education. PT provides update on pt's mobility status and recommendations for safe mobility following discharge, as well as recommendation for 24 hour supervision. PT provides demonstration of safe guarding of pt for sit to stand transfer and stand step transfer from tilt in space WC to pt's personal standard manual WC. PT provides minA to facilitate anterior weight shift for transfer. Son then provides assistance for pt to perform sit to stand transfer from [facility], with PT cueing on safe guarding position. WC transport to gym. Pt performs sit to stand to RW with minA, then ambulates x25' with RW and minA +1, with +2 WC follow for safety. Pt transfers from [facility] to bed with PT providing CGA/minA. Sit to supine with modA management of bilateral lower extremities. Pt left supine with all needs within reach.   2nd Session: Pt received supine in bed and agrees to therapy. No complaint of pain. Pt's daughter present and PT provides education on pt's mobility status and recommendation for safe mobility following discharge. Pt performs supine to sit with minA and cues for sequencing and positioning. Pt performs sit to stand from slightly elevated bed with CGA/minA and cues for hand placement. Pt performs stand step transfer from bed to [facility] with RW and CGA. Pt attempts to urinate but is unsuccessful. Stand step back to bed with CGA. ModA for supine to sit with management of bilateral lower extremities. Pt left supine with  all needs within reach.   Therapy Documentation Precautions:  Precautions Precautions: Fall, Back Precaution Comments: foley Required Braces or Orthoses: Other Brace Other Brace: prevlon boots Restrictions Weight Bearing Restrictions: No Other Position/Activity Restrictions: external rotation R hip at baseline at all times, RLE shorter than LLE, pt ambulated household distances PTA, w/c for community   Therapy/Group: Individual Therapy  Breck Coons, PT, DPT 10/07/2022, 5:13 PM

## 2022-10-08 ENCOUNTER — Other Ambulatory Visit (HOSPITAL_COMMUNITY): Payer: Self-pay

## 2022-10-08 ENCOUNTER — Telehealth: Payer: Self-pay

## 2022-10-08 MED ORDER — BETHANECHOL CHLORIDE 10 MG PO TABS
5.0000 mg | ORAL_TABLET | Freq: Three times a day (TID) | ORAL | Status: DC
  Administered 2022-10-08 – 2022-10-13 (×15): 5 mg via ORAL
  Filled 2022-10-08 (×15): qty 1

## 2022-10-08 NOTE — Progress Notes (Signed)
PROGRESS NOTE   Subjective/Complaints:  She continues to require IC.  We discussed discharge medications. We discussed plan for nursing to provide education on wound care.  . ROS: +back pain-managed, +nausea-improved . + Bladder fullness sensation, + Urinary retention. denies fevers, chills, CP, SOB, abd pain, D/C, new/worsening paresthesias/weakness, or any other complaints at this time.     Objective:   No results found. No results for input(s): "WBC", "HGB", "HCT", "PLT" in the last 72 hours.      No results for input(s): "NA", "K", "CL", "CO2", "GLUCOSE", "BUN", "CREATININE", "CALCIUM" in the last 72 hours.    Intake/Output Summary (Last 24 hours) at 10/08/2022 0824 Last data filed at 10/08/2022 0428 Gross per 24 hour  Intake 840 ml  Output 2050 ml  Net -1210 ml         Physical Exam: Vital Signs Blood pressure 126/83, pulse 88, temperature 98 F (36.7 C), temperature source Oral, resp. rate 18, height 5\' 1"  (1.549 m), weight 65.3 kg, SpO2 98 %.   Constitutional:  thin, NAD, lying in bed, therapy in the room HENT: [place]/AT,  MMM today  Eyes: Conjugate gaze conjunctiva clear, PERRL Neck: Trach site well healed Cardiovascular: RRR, soft murmur heard (VSD), no pedal edema Pulmonary: CTAB no w/r/r, no increased WOB Abdominal: Soft, nontender, nondistended, positive bowel sounds/normal active Genitourinary:    Comments:Sacral wound dressing clean  foley in place-clear yellow urine Ext: no clubbing, cyanosis, or edema Psych: pleasant Skin: upper back wound just dressed. Did not re-open dressing today. Lower back wound covered.  PRIOR EXAM: Neurological: Follows commands, some STM deficits. Decreased insight still Alert and oriented x3, no hypertonia noted, CN 2-12 grossly intact. Motor 4/4 UE and 3-4/5 LE prox to distal--stable to improved MSK: LB  remains tender. Tenderness R chest wall, mid-low  back    Assessment/Plan: 1. Functional deficits which require 3+ hours per day of interdisciplinary therapy in a comprehensive inpatient rehab setting. Physiatrist is providing close team supervision and 24 hour management of active medical problems listed below. Physiatrist and rehab team continue to assess barriers to discharge/monitor patient progress toward functional and medical goals  Care Tool:  Bathing  Bathing activity did not occur: Refused           Bathing assist       Upper Body Dressing/Undressing Upper body dressing   What is the patient wearing?: Hospital gown only    Upper body assist Assist Level: Total Assistance - Patient < 25%    Lower Body Dressing/Undressing Lower body dressing      What is the patient wearing?: Hospital gown only     Lower body assist Assist for lower body dressing: Total Assistance - Patient < 25%     Toileting Toileting Toileting Activity did not occur Landscape architect and hygiene only): N/A (no void or bm) (using catheter and rectal tube)  Toileting assist Assist for toileting: Dependent - Patient 0%     Transfers Chair/bed transfer  Transfers assist  Chair/bed transfer activity did not occur: Safety/medical concerns  Chair/bed transfer assist level: Maximal Assistance - Patient 25 - 49%     Locomotion Ambulation   Ambulation assist  Ambulation activity did not occur: Safety/medical concerns          Walk 10 feet activity   Assist  Walk 10 feet activity did not occur: Safety/medical concerns        Walk 50 feet activity   Assist Walk 50 feet with 2 turns activity did not occur: Safety/medical concerns         Walk 150 feet activity   Assist Walk 150 feet activity did not occur: Safety/medical concerns         Walk 10 feet on uneven surface  activity   Assist Walk 10 feet on uneven surfaces activity did not occur: Safety/medical concerns          Wheelchair     Assist Is the patient using a wheelchair?: Yes Type of Wheelchair: Manual Wheelchair activity did not occur: Safety/medical concerns         Wheelchair 50 feet with 2 turns activity    Assist    Wheelchair 50 feet with 2 turns activity did not occur: Safety/medical concerns       Wheelchair 150 feet activity     Assist  Wheelchair 150 feet activity did not occur: Safety/medical concerns       Blood pressure 126/83, pulse 88, temperature 98 F (36.7 C), temperature source Oral, resp. rate 18, height 5\' 1"  (1.549 m), weight 65.3 kg, SpO2 98 %.  Medical Problem List and Plan: 1. Functional deficits secondary to osteomyelitis, infection of lumbar surgical site complicated by meningitis/ventriculitis.             -pt also with significant deconditioning             -patient may not yet shower -ELOS/Goals: extended to 3/27, pt agreeable. Will proceed with that ELOS in mind.  Min/Mod A goals with PT, OT, SLP  -Continue CIR therapies including PT, OT, and SLP   -Family education this sunday  2.  Antithrombotics: -DVT/anticoagulation:  Pharmaceutical: Lovenox 40mg  QD             -antiplatelet therapy: N/a  3. Pain Management: Butrans 37mcg/hr patch for pain control w/ Flexeril 5mg  TID, Tylenol PRN, Oxycodone 7.5mg  q4h PRN, Gabapentin 100mg  TID  -2/26 tylenol PRN for HA -2/28 Fioricet ordered PRN headache -09/17/22 will change PRN oxycodone 2.5mg  to percocet 5-325mg , consider increase gabapentin to help with pain and HA- make changes gradually due to hx of encephalopathy  -3/7 Voltaren gel for L shoulder pain -3/8   lidocaine for chest wall tenderness started -3/15- continue flexeril  10mg  tid -She's using oxycodone 5mg  3-4x daily, butrans ptch 56mcg/hr per week as well as gabapentin tid and tylenol and butalbital prn -10/02/22 pain overall managed but breakthrough pain happening more and not as well managed, considered Tramadol but this would not be  beneficial per pharmacy due to her butrans patch. Don't want to go up on Oxycodone if we can avoid it; pt agreeable to tracking pain over the weekend, and discussing with weekday team whether longer acting opioid would be more beneficial with shorter acting PRNs; if pain uncontrolled this weekend, may switch. Monitor for now.  -Ordered lidoderm patches x2 for low back to see if we can get relief that way -10/03/22 pain doing some better, will ask primary team regarding pain regimen but for now she's doing alright.  -3/18 continue current regimen for now, asked nursing to replace butrans patch that has fallen off   4. Mood/Behavior/Sleep: LCSW to follow for evaluation and support.             --  Trazodone 100mg  QHS and melatonin 5mg  QHS PRN for  insomnia/mood  -09/04/22 placed sign on door to minimize interruptions 11p-5a; changed CBG checks as below to minimize interruptions             -antipsychotic agents:  Luvox 100mg  QHS and Lamictal 200mg  QD  -Continue Aricept 10mg  QD  -Seen by neuropsych-appreciate assistance  -3/1 increase melatonin to 10mg  dose QHS PRN  -3/4 decrease melatonin back to 5mg  dose QHS PRN -3/11 melatonin 5mg  working reasonably well  -3/15 seems to be dealing with extended LOS a bit better today   -team to continue to provide ego support   -10/02/22 better spirits! Ok with extended LOS  -10/04/22 discussed with Dr. Lovette Cliche, psychiatry consult was placed regarding psychiatric medications, depression, anxiety  3/19 pt refused psych consult- psych to try again tomorrow  3/21 Pt with hx of bipolar Disorder ,seen by psychiatry today, psych rec restart wellbutrin Xl 150mg  daily- order placed  5. Neuropsych/cognition: This patient is not fully capable of making decisions on her own behalf. -reviewed cognitive recovery with patient this week. May take until this summer to plateau. Had some mild memory deficits PTA as well.   6. Skin/Wound Care: WOC to follow for wound VAC  changes --MASD managed by fecal system-- flexiseal clogged 09/04/22 AM but nursing states it's now working; monitor--might be able to d/c soon? -2/21 WOC stopped VAC, start aquacel and dressing -see flexiseal discussion below, #17 -2/27 WOC following, slough pulled away revealing 2cm deep defect -2/29 Spoke with Dr. Zada Finders today about wound depth, continue local wound care- continue to monitor, continue abx per ID,  no plan for procedure at this time -3/4 Seen by [facility]- continue aquacel, abd pad/tape, apprecaite assistance -3/7 WOC rec medihoney, fill wound with moistened gauza, distal portion of wound to be backed with silver hydrofiber, appreciate assistance -3/8 Dr. Zada Finders contacted about wound, feels like this is doing well overall, continue current wound care orders, appreciate assistance -3/14-15 changed dressing to collagenase to see if we can get better debridement of fibronecrotic tissue. Small area really needs a bit of debridement  -I contacted NS re: f/u wound next week -3/21 seen by [facility] today, recommending consideration of CSWD by NSGY, will discuss with NSGY  7. Fluids/Electrolytes/Nutrition: Monitor I/O. Continue tube feeds-->likely contributing to diarrhea             -monitor intake closely, continue vitamins -2/19 discontinue core track, Consult dietician -2/27 decrease BMP to every other day -3/14--change labs to weekly -3/15 may keep PICC for weekend and until Monday AM lab draws. If all remains stable, labs ok, then we can remove PICC -10/02/22 overnight PICC discomfort, no blood return, pt wanted it removed so PICC team removed it; if further IV needs occur, would need PIV  8.Sepsis w/ osteomyelitis/meningitis/encephalitis: Continue Cefepime 2g q8h, Vanc 1250mg  q12h, and Flagyl 500mg  q8h for at least 6 weeks --  Vanc+Cefepime complete. Metronidazole has been discontinued as of 09/16/22 (was primarily for PNA, no longer needed) -3/13 --reviewed repeat MRI of brain  with resolution! Spoke with NS -3/15 converting to levaquin 750mg  QD and doxycycline 100mg  BID today -f/u with Dr. [name] scheduled for 3/25--since she'll be here, he or partner can see while in rehab.  -3/19 we will contact Dr. Drucilla Schmidt to see if any alternative antibiotics are available due to nausea -3/20 Dr [name] recommended continue doxycyline, advised pt to take medications with food to minimize nausea -3/21 Pt would like to know  if ID wound like to see her inpatient since she has f/u day after discharge scheduled, Called ID office- Dr. [name] will be outpatient that week, Office to call and ask if they would prefer a different date for the ID visit, appreciate assistance   9. H/o PVT:  Monitor BP/heart rate TID--on coreg 12.5mg  BID>>up to 25mg  BID as of 09/10/22, Labetalol 10mg  q2h PRN  -2/23  BP has been a little above goal, increase coreg dose to 25mg  BID  -09/25/22 BP/HR with fair control, continue to monitor -09/26/22 BP running high, restarted Losartan 25mg  QHS (previously on 100mg ) -3/21 BP well controlled     10/08/2022    4:52 AM 10/08/2022    4:17 AM 10/07/2022   10:31 PM  Vitals with BMI  Weight 143 lbs 15 oz    Systolic  [id number]  Diastolic  83 70  Pulse  88 91      10. Urinary retention: Has failed 4 voiding trails. Has had foley since 12/23             -maintain foley cath, foley mgt per protocol -- Continue Flomax 0.4mg  qPM. Will d/c urecholine.  -10/02/22 pt and daughter curious when TOV might occur; hoping to get her stronger and able to get to [facility] better before doing so; discussed that this will be a weekday team discussion, may end up going home with it -3/19 voiding trial today -3/20 Has required IC, continue voiding trial, increase flomax to 0.8mg ,  may need f/u with urology -3/22 Trial urocholine 5mg  TID, consider restart foley tomorrow  11. VDRF s/p trach: Tolerating PMSV well -I ordered downsizing to CFS # 4 today--tolerated well -continue ATC w/ pulmonary  hygiene -begin day time plugging of trach tomorrow. Decannulate soon. -Trial of capping with respiratory-  can trial 24 hour capping today -2/22 Leave trach uncapped tonight -2/28 plan for decannulation today, has had trach capped for >72hours -2/29 Lurline Idol was  removed  -doesn't need bandaid anymore on trach site   12. T2DM: Monitor BS ac/hs and use SSI for elevated BS.               --continue liberalized diet for now. -3/15 no longer checking cbgs per pt/family request although am serum glucose was elevated yesterday-- - resumed metformin 500mg   bid  -consider changing to CM diet at some point -3/18 glucose stable 104   13. Hypokalemia: Recurrent likely due to diarrhea/intake.  -09/18/22 stable K+ 4.2 on 09/17/22, wants to change to BID dosing so switched Klor to 55mEq BID (still will have 25mEq total/day)  -3/18 k+ stable at 4.1  14. Anemia of critical illness: Hgb 7.5-8.9 range -stable at 9.9---f/u 10/04/22    Latest Ref Rng & Units 10/04/2022    7:38 AM 09/27/2022    5:28 AM 09/20/2022    3:44 AM  CBC  WBC 4.0 - 10.5 K/uL 7.1  8.0  7.3   Hemoglobin 12.0 - 15.0 g/dL 10.4  9.9  10.1   Hematocrit 36.0 - 46.0 % 31.5  31.6  31.7   Platelets 150 - 400 K/uL 475  411  419      15. Hyponatremia/hypochloremia: I -09/04/22 Na 129, Cl 95, both stable, continue salt tabs 1g BID -Cortisol testing done during hospitalization (13.8 on 08/30/22), was normal, but daughter and pt want to make sure this is followed up- defer to weekday team regarding ongoing ?Addison's testing -2/21 129, a little decreased, fluid restriction started -2/28 stable at 131 -  09/21/22 Na stable at 130, continue to follow with  q48h BMPs- adjust to 8am labs -Fluid restrictions liberalized to 1563ml -3/11 Na stable at 130, monitor on weekly labs -3/15 will relax to 1800 cc 3/18 stable Na 129  16. Encephalopathy/Delirium: improved substantially  17. Loose stools likely d/t TF and abx.            -recent rectal tube, since  dc'ed  -generally improved -09/30/22 LBM --mushy but formed -10/03/22 more formed BMs, LBM 10/02/22, continue monitoring LBM  3/19  18. Hypothyroidism: continue synthroid 41mcg QD  19. Chronic combined CHF, BLE Swelling: recent 2D echo done 08/20/2022 showed apical hypokinesis, LVEF 40-45%, RV function was reduced. Intermittently diuresed during hospitalization, most recently given 40mg  IV lasix on 09/03/22 -09/04/22 noted to still have BLE swelling, start 20mg  PO lasix and monitor lytes/BP/swelling -3/5 wt a little up, monitor tend -3/6 asked nutrition to stop milk/carnation- she wasn't drinking but it was counting toward her fluid restriction -3/7 wt stable, fluid restrictions liberalized to 1541ml -09/25/22 wt stable, monitor -10/01/22 weight stable to decreased. Not sure it's real although po intake has been sporadic -10/02/22 wt uptrending but stable. Monitor  -3/22 no signs of fluid overload Filed Weights   10/06/22 2118 10/07/22 0500 10/08/22 0452  Weight: 65.8 kg 60.5 kg 65.3 kg    20. Oral ulcer  --2/28 Orajel for oral ulcer discomfort  21.  Dry mucous membranes, may be related to anticholinergic meds cyclobenzaprine and trazodone, pain and sleep are   better so reluctant to stop at this time  22. Nausea: somewhat chronic, takes meds on empty stomach in the morning, compazine given and helped, continue PRN compazine 5-10mg , encouraged pt to have some food before meds  -3/9 Continue compazine,  advised to take meds with food in AM     LOS: 35 days A FACE TO [place] 10/08/2022, 8:24 AM

## 2022-10-08 NOTE — Progress Notes (Signed)
Occupational Therapy Session Note  Patient Details  Name: ESHAAL SKILLINGS MRN: [id number] Date of Birth: 1956-02-02  Today's Date: 10/08/2022 OT Individual Time: 1035-1200 OT Individual Time Calculation (min): 85 min    Short Term Goals: Week 6:  OT Short Term Goal 1 (Week 6): STG= LTG d/t ELOS  Skilled Therapeutic Interventions/Progress Updates:    Session focused on community outing with recreational therapist present as well. Skilled education provided throughout transfer to/from [employer] shop focusing on: community accessibility at wheelchair and walker level, energy conservation strategies, fall risk reduction techniques, maximizing independence at both wheelchair and RW level, and expectations following discharge with expected assist level.  Pt has overall more difficulty with transfers this session, requiring min-mod A overall. Discussed this as length, pt endorsing anxiety in how excited she was for the outing and the first time being out in the community in several months. Discussed carryover to future community outings and strategies for stress management. Pt very receptive and very appreciative of the entire outing. She stood from the w/c with min A, took about 4 steps with min, progressing to mod A. Pt was able to utilize appropriate pragmatic and executive functioning skills in ordering a smoothie herself and remembering to order one for a nurse that asked for one previously as well. Rehab Lucianne Lei utilized for transfers at w/c level, discussed carryover to car transfer. Reviewed OT/PT/SLP goals for outing, pt meeting all but one pertaining to functional mobility. Pt returned to her room and was left sitting up with all needs met. Pain reported as managed throughout the session- pt very encouraged by this.   Therapy Documentation Precautions:  Precautions Precautions: Fall, Back Precaution Comments: foley Required Braces or Orthoses: Other Brace Other Brace: prevlon  boots Restrictions Weight Bearing Restrictions: No Other Position/Activity Restrictions: external rotation R hip at baseline at all times, RLE shorter than LLE, pt ambulated household distances PTA, w/c for community   Therapy/Group: Individual Therapy  Curtis Sites 10/08/2022, 6:09 AM

## 2022-10-08 NOTE — Telephone Encounter (Signed)
Received call from Dr. Curlene Dolphin with physical medicine and rehab, patient is currently admitted on the rehab floor. Discharge is planned for 3/27, she has follow up scheduled with Dr. [name] on 3/28. Patient's family is concerned about getting her to an appointment the day after discharge and were wondering if provider would be able to come and see her while she is still in the hospital. Dr. [name] will be in clinic that week, called patient to offer her a more convenient appointment time, no answer. Left HIPAA compliant voicemail requesting callback.   Beryle Flock, RN

## 2022-10-08 NOTE — Consult Note (Signed)
[facility] Face-to-Face Psychiatry Consult   Reason for Consult:  Depression and medication management Referring Physician:  Dr. Curlene Dolphin Patient Identification: NICOLET GRIFFY MRN:  [id number] Principal Diagnosis: Infection of lumbar spine [facility]) Diagnosis:  Principal Problem:   Infection of lumbar spine ([place]) Active Problems:   VENTRICULAR SEPTAL DEFECT, CONGENITAL   Anxiety   Diabetes mellitus, type 2 (HCC)   Acid reflux   Mild cognitive impairment   Mood disorder (HCC)   Wound dehiscence   Encephalopathy   Malnutrition of moderate degree   Bacterial meningitis   Hardware complicating wound infection ([place])   Acute blood loss anemia   Chronic combined systolic and diastolic CHF (congestive heart failure) (HCC)   Chronic midline thoracic back pain   Primary insomnia   Chronic wound   Hyperglycemia   Adjustment disorder with mixed anxiety and depressed mood   Bipolar disorder, in partial remission, most recent episode depressed ([place])   Total Time Spent in Direct Patient Care:  I personally spent 45 minutes on the unit in direct patient care. The direct patient care time included face-to-face time with the patient, reviewing the patient's chart, communicating with other professionals, and coordinating care. Greater than 50% of this time was spent in counseling or coordinating care with the patient regarding goals of hospitalization, psycho-education, and discharge planning needs.   HPI:   NGOZI ALVIDREZ is a 67 y.o. female patient admitted with Infection of Lumbar Spine.   Patient seen laying in hospital bed. She reports that she is currently in the hospital due to an infection of her spine and difficulty with walking.  Patient has been up and active today with physical therapy.  She was started on Wellbutrin XL 150 mg daily for feeling increasingly depressed and anxious regarding her discharge planning and all of the things that she needs to get figured out before she goes  home regarding therapy, home health, and mental health treatment..  Patient denies any side effects to medication.  Patient denies any history of seizure disorder. Patient states that she is aware this will take some time to help improve her depression.  She endorses concern of having difficulty with focus.  Reassurance provided that Wellbutrin can be beneficial for treating attention symptoms.  Patient states it has been difficult for her to sleep while in the hospital due to being outside at the nurse's desk.  She is hoping to sleep better at home, but anxious that she will need more medication.  Patient does follow with an outpatient psychiatry team through Dr. Reddy's office.  She has regular therapy with Beckey Downing.  Patient intends to have follow-up with neurology and will discuss with them her concerns for the chance of developing Alzheimer dementia, given a strong family history.  Patient asks appropriate questions regarding medication.  She specifically denies any SI, HI, AVH.  She is alert and oriented. She is currently taking Luvox 100 mg PO QHS, Gabapentin 100 mg PO TID, Lamictal 200 mg PO daily, Aricept 10 mg PO daily, Wellbutrin XL 150 mg and Trazodone 100 mg PO QHS.    Past Psychiatric History: See above  Risk to Self:   No Risk to Others:   No Prior Inpatient Therapy:   No Prior Outpatient Therapy:   No  Past Medical History:  Past Medical History:  Diagnosis Date   Anemia    Anxiety    Arthritis    Bipolar disorder ([place])    Depression    GERD (gastroesophageal reflux disease)  Heart murmur    "related to VSD"   High cholesterol    History of blood transfusion    "related to OR" (08/19/2016)   History of hiatal hernia    Hypertension    Hyperthyroidism    Mild cognitive impairment 09/06/2018   Paroxysmal ventricular tachycardia (HCC)    Type II diabetes mellitus ([place])    UTI (urinary tract infection)    being treated with Keflex   Ventricular septal defect     Past  Surgical History:  Procedure Laterality Date   ABDOMINAL HYSTERECTOMY     BACK SURGERY     CARDIAC CATHETERIZATION N/A 06/23/2015   Procedure: Left Heart Cath and Coronary Angiography;  Surgeon: Larey Dresser, MD;  Location: [place] [facility];  Service: Cardiovascular;  Laterality: N/A;   CARDIAC CATHETERIZATION  1960   "VSD was so small; didn't need repaired"   EXAM UNDER ANESTHESIA WITH MANIPULATION OF HIP Right 06/02/2014   dr Rhona Raider   FRACTURE SURGERY     HERNIA REPAIR     HIP CLOSED REDUCTION Right 06/02/2014   Procedure: CLOSED MANIPULATION HIP;  Surgeon: Hessie Dibble, MD;  Location: [place];  Service: Orthopedics;  Laterality: Right;   JOINT REPLACEMENT     JOINT REPLACEMENT     POSTERIOR LUMBAR FUSION 4 LEVEL N/A 06/26/2022   Procedure: Lumbar One To Lumbar Five Posterior Instrumented Fusion;  Surgeon: Judith Part, MD;  Location: [place];  Service: Neurosurgery;  Laterality: N/A;   REFRACTIVE SURGERY Bilateral    SHOULDER ARTHROSCOPY Right    SHOULDER OPEN ROTATOR CUFF REPAIR Right    SPINAL FUSION  1996   "t10 down to my coccyx   SPINE HARDWARE REMOVAL     TOTAL ABDOMINAL HYSTERECTOMY     TOTAL HIP ARTHROPLASTY Right 05/10/2014   [place]      by dr Harrell Gave [place]   TOTAL KNEE ARTHROPLASTY Left    TOTAL SHOULDER ARTHROPLASTY Left 08/19/2016   Procedure: TOTAL SHOULDER ARTHROPLASTY;  Surgeon: Tania Ade, MD;  Location: [facility];  Service: Orthopedics;  Laterality: Left;  Left total shoulder replacement   Family Psychiatric  History: Daughter ADHD Multiple family members with Alzheimer's Dementia  Social History:  Social History   Substance and Sexual Activity  Alcohol Use No     Social History   Substance and Sexual Activity  Drug Use No    Social History   Socioeconomic History   Marital status: Married    Spouse name: Not on file   Number of children: Not on file   Years of education: Not on file   Highest education level: Master's  degree (e.g., MA, MS, MEng, MEd, MSW, MBA)  Occupational History   Occupation: Product manager: SELF-EMPLOYED    Comment: former  Tobacco Use   Smoking status: Former    Years: 50    Types: Cigarettes   Smokeless tobacco: Never   Tobacco comments:    03/30/21 smokes 2 cigs daily.      08/06/22 Janett Billow stated that the patient is not smoking while at [facility] for rehab. [facility]  Vaping Use   Vaping Use: Never used  Substance and Sexual Activity   Alcohol use: No   Drug use: No   Sexual activity: Not on file    Comment: Hysterectomy  Other Topics Concern   Not on file  Social History Narrative   Right handed   Caffeine 2-3 cups daily    Lives at  home with husband    Social Determinants of Health   Financial Resource Strain: Not on file  Food Insecurity: No Food Insecurity (06/27/2022)   Hunger Vital Sign    Worried About Running Out of Food in the Last Year: Never true    Ran Out of Food in the Last Year: Never true  Transportation Needs: No Transportation Needs (06/27/2022)   PRAPARE - Hydrologist (Medical): No    Lack of Transportation (Non-Medical): No  Physical Activity: Not on file  Stress: Not on file  Social Connections: Not on file   Additional Social History:  Husband, 11 year old daughter Judson Roch), 35 year old daughter [name]) and 54 year old son Delphina Cahill) are very supportive.     Allergies:  No Known Allergies  Labs: No results found for this or any previous visit (from the past 48 hour(s)).  Current Facility-Administered Medications  Medication Dose Route Frequency Provider Last Rate Last Admin   0.9 %  sodium chloride infusion   Intravenous PRN Jennye Boroughs, MD 10 mL/hr at 09/28/22 1813 New Bag at 09/28/22 1813   0.9 %  sodium chloride infusion   Intravenous PRN Jennye Boroughs, MD 10 mL/hr at 09/28/22 1640 New Bag at 09/28/22 1640   acetaminophen (TYLENOL) tablet 325-650 mg  325-650 mg Oral Q6H PRN Jennye Boroughs,  MD   650 mg at 10/07/22 1533   alum & mag hydroxide-simeth (MAALOX/MYLANTA) 200-200-20 MG/5ML suspension 30 mL  30 mL Oral Q4H PRN Bary Leriche, PA-C   30 mL at 10/02/22 1527   benzocaine (ORAJEL) 10 % mucosal gel   Mouth/Throat QID PRN Jennye Boroughs, MD       bethanechol (URECHOLINE) tablet 5 mg  5 mg Oral TID Jennye Boroughs, MD   5 mg at 10/08/22 1418   bisacodyl (DULCOLAX) suppository 10 mg  10 mg Rectal Daily PRN Love, Pamela S, PA-C       buprenorphine (BUTRANS) 5 MCG/HR 1 patch  1 patch Transdermal Weekly Bary Leriche, PA-C   1 patch at 10/04/22 1721   buPROPion (WELLBUTRIN XL) 24 hr tablet 150 mg  150 mg Oral Daily Jennye Boroughs, MD   150 mg at 10/08/22 0820   butalbital-acetaminophen-caffeine (FIORICET) 50-325-40 MG per tablet 1 tablet  1 tablet Oral Q6H PRN Jennye Boroughs, MD   1 tablet at 10/01/22 0754   carvedilol (COREG) tablet 25 mg  25 mg Oral BID WC Jennye Boroughs, MD   25 mg at 10/08/22 0820   cyclobenzaprine (FLEXERIL) tablet 10 mg  10 mg Oral TID [name], MD   10 mg at 10/08/22 1418   diclofenac Sodium (VOLTAREN) 1 % topical gel 2 g  2 g Topical QID Jennye Boroughs, MD   2 g at 10/08/22 0820   diphenhydrAMINE (BENADRYL) 12.5 MG/5ML elixir 12.5-25 mg  12.5-25 mg Oral Q6H PRN Love, Pamela S, PA-C       diphenoxylate-atropine (LOMOTIL) 2.5-0.025 MG per tablet 1-2 tablet  1-2 tablet Oral QID PRN Love, Pamela S, PA-C       donepezil (ARICEPT) tablet 10 mg  10 mg Oral Daily Love, Pamela S, PA-C   10 mg at 10/08/22 0820   doxycycline (VIBRA-TABS) tablet 100 mg  100 mg Oral Q12H Reome, Earle J, RPH   100 mg at 10/08/22 0820   enoxaparin (LOVENOX) injection 40 mg  40 mg Subcutaneous Q24H Love, Pamela S, PA-C   40 mg at 10/07/22 2046   famotidine (  PEPCID) tablet 20 mg  20 mg Oral BID Love, Pamela S, PA-C   20 mg at 10/08/22 0820   feeding supplement (ENSURE ENLIVE / ENSURE PLUS) liquid 237 mL  237 mL Oral TID BM Jennye Boroughs, MD   237 mL at 10/06/22 1558    fiber (NUTRISOURCE FIBER) 1 packet  1 packet Oral BID Bary Leriche, PA-C   1 packet at 09/18/22 0813   fluvoxaMINE (LUVOX) tablet 100 mg  100 mg Oral QHS Love, Pamela S, PA-C   100 mg at 10/07/22 2046   furosemide (LASIX) tablet 20 mg  20 mg Oral Daily Bary Leriche, PA-C   20 mg at 10/08/22 0820   gabapentin (NEURONTIN) capsule 100 mg  100 mg Oral TID Izora Ribas, MD   100 mg at 10/08/22 1418   [name]'s butt cream   Topical Daily Bary Leriche, PA-C   Given at 10/08/22 0820   guaiFENesin-dextromethorphan (ROBITUSSIN DM) 100-10 MG/5ML syrup 5-10 mL  5-10 mL Oral Q6H PRN Love, Pamela S, PA-C       ipratropium-albuterol (DUONEB) 0.5-2.5 (3) MG/3ML nebulizer solution 3 mL  3 mL Nebulization Q6H PRN Love, Pamela S, PA-C       labetalol (NORMODYNE) injection 10 mg  10 mg Intravenous Q2H PRN Love, Pamela S, PA-C       lamoTRIgine (LAMICTAL) tablet 200 mg  200 mg Oral Daily Reesa Chew S, PA-C   200 mg at 10/08/22 [id number]   levofloxacin (LEVAQUIN) tablet 750 mg  750 mg Oral Daily Reome, Earle J, RPH   750 mg at 10/08/22 [id number]   levothyroxine (SYNTHROID) tablet 88 mcg  88 mcg Oral Q0600 Bary Leriche, PA-C   88 mcg at 10/08/22 0615   lidocaine (LIDODERM) 5 % 2 patch  2 patch Transdermal Q24H [address], [place], [place]   2 patch at 10/05/22 1242   lidocaine (XYLOCAINE) 2 % jelly   Topical PRN Love, Pamela S, PA-C       losartan (COZAAR) tablet 25 mg  25 mg Oral QHS [address] [place], [place]   25 mg at 10/07/22 2045   melatonin tablet 5 mg  5 mg Oral QHS PRN Jennye Boroughs, MD   5 mg at 10/06/22 2326   menthol-cetylpyridinium (CEPACOL) lozenge 3 mg  1 lozenge Oral PRN Love, Pamela S, PA-C       metFORMIN (GLUCOPHAGE) tablet 500 mg  500 mg Oral BID WC Raulkar, Clide Deutscher, MD   500 mg at 10/08/22 0820   multivitamin with minerals tablet 1 tablet  1 tablet Oral Daily Bary Leriche, PA-C   1 tablet at 10/08/22 0820   Oral care mouth rinse  15 mL Mouth Rinse PRN Love, Pamela S, PA-C       oxyCODONE  (Oxy IR/ROXICODONE) immediate release tablet 5 mg  5 mg Oral Q6H Love, Pamela S, PA-C   5 mg at 10/08/22 1018   oxyCODONE-acetaminophen (PERCOCET/ROXICET) 5-325 MG per tablet 1 tablet  1 tablet Oral Q4H PRN Bary Leriche, PA-C   1 tablet at 10/08/22 1418   potassium chloride (KLOR-CON M) CR tablet 30 mEq  30 mEq Oral BID [address], [place], [place]   30 mEq at 10/08/22 [id number]   prochlorperazine (COMPAZINE) tablet 5-10 mg  5-10 mg Oral Q6H PRN Bary Leriche, PA-C   10 mg at 10/05/22 1308   Or   prochlorperazine (COMPAZINE) injection 5-10 mg  5-10 mg Intramuscular Q6H PRN Bary Leriche, PA-C  Or   prochlorperazine (COMPAZINE) suppository 12.5 mg  12.5 mg Rectal Q6H PRN Love, Pamela S, PA-C       saccharomyces boulardii (FLORASTOR) capsule 250 mg  250 mg Oral BID Love, Pamela S, PA-C   250 mg at 10/08/22 0820   sodium chloride flush (NS) 0.9 % injection 10-40 mL  10-40 mL Intracatheter PRN Bary Leriche, PA-C   10 mL at 09/24/22 [id number]   sodium chloride tablet 1 g  1 g Oral BID WC LoveIvan Anchors, PA-C   1 g at 10/08/22 [id number]   sodium phosphate (FLEET) 7-19 GM/118ML enema 1 enema  1 enema Rectal Once PRN Love, Pamela S, PA-C       tamsulosin (FLOMAX) capsule 0.8 mg  0.8 mg Oral QPC supper Jennye Boroughs, MD   0.8 mg at 10/07/22 1820   traZODone (DESYREL) tablet 100 mg  100 mg Oral QHS Love, Pamela S, PA-C   100 mg at 10/07/22 2045   Psychiatric Specialty Exam:  Presentation  General Appearance:  Appropriate for Environment; Neat  Eye Contact: Good  Speech: Clear and Coherent  Speech Volume: Normal  Handedness:No data recorded  Mood and Affect  Mood: Anxious  Affect: Appropriate; Congruent   Thought Process  Thought Processes: Goal Directed; Coherent  Descriptions of Associations:Tangential (related to anxiety)  Orientation:Full (Time, Place and Person)  Thought Content:Logical  History of Schizophrenia/Schizoaffective disorder:No data recorded Duration of Psychotic  Symptoms:No data recorded Hallucinations:Hallucinations: None  Ideas of Reference:None  Suicidal Thoughts:Suicidal Thoughts: No  Homicidal Thoughts:Homicidal Thoughts: No   Sensorium  Memory: Recent Good; Remote Good  Judgment: Good  Insight: Good   Executive Functions  Concentration: Good  Attention Span: Good  Recall: Good  Fund of Knowledge: Good  Language: Good   Psychomotor Activity  Psychomotor Activity:Psychomotor Activity: Normal   Assets  Assets:Communication Skills; Desire for Improvement; Financial Resources/Insurance; Housing; Intimacy; Leisure Time; Resilience; Social Support; Transportation   Sleep  Sleep: Sleep: Fair   Physical Exam: Physical Exam Vitals and nursing note reviewed.  Constitutional:      Appearance: Normal appearance. She is normal weight.  Cardiovascular:     Rate and Rhythm: Normal rate.  Pulmonary:     Effort: Pulmonary effort is normal. No respiratory distress.  Neurological:     General: No focal deficit present.     Mental Status: She is alert and oriented to person, place, and time.    Review of Systems  Psychiatric/Behavioral:  Negative for depression, hallucinations, memory loss, substance abuse and suicidal ideas. The patient is nervous/anxious (about discharge) and has insomnia (treated).    Blood pressure 124/77, pulse 93, temperature 98.4 F (36.9 C), temperature source Oral, resp. rate 17, height 5\' 1"  (1.549 m), weight 65.3 kg, SpO2 99 %. Body mass index is 27.2 kg/m.  Treatment Plan Summary: Bipolar Disorder MRE Depressed -Continue Lamictal 200 mg PO daily for mood stabilization -Continue Luvox 100 mg PO daily for OCD symptoms -Continue Trazodone 100 mg PO QHS for sleep -Continue Wellbutrin XL 150 mg PO daily for depression features  Anxiety Unspecified -Continue Gabapentin 100 mg PO TID  Opiate Use Disorder in complete sustained remission  Alcohol Use Disorder in complete sustained  remission  Disposition:  -Patient does not meet criteria for IVC -Patient is at her psychiatric baseline - Psychiatry will sign off.  Please re-consult for any future acute psychiatric concerns.    Lavella Hammock, MD 10/08/2022 4:33 PM

## 2022-10-08 NOTE — Progress Notes (Signed)
Pt called out to be cathed.  Offered to toilet her but pt declined.  Did not get to do a bladder scan on pt due to her bladder hurting her.  She wanted to be cathed immediately.

## 2022-10-08 NOTE — Progress Notes (Signed)
Patient ID: LAJEUNE MARCHEL, female   DOB: 03/03/1956, 67 y.o.   MRN: [id number]  Spoke with husband via telephone to give him the update regarding findings about insurance coverage for home health and private duty. He is aware home health is covered but private duty is not and he will need to call from the list of agencies and obtain the coverage they need at home for someone to be there with pt when they are not. He was here last night and assisted with a transfer and is aware how well she is doing. He has no preference for home health agencies will ask liaison to talk with him regarding questions. Husband will work on Psychologist, counselling duty CNA for home/coverage. He will also be coming in for education on Sunday 1:00-3:00 and will continue to work on discharge needs. Son and daughter did hands on education with PT yesterday.

## 2022-10-08 NOTE — Progress Notes (Signed)
Recreational Therapy Session Note  Patient Details  Name: [name] MRN: [id number] Date of Birth: 07-Jun-1956 Today's Date: 10/08/2022 Time [id number] Pain: no c/o Skilled Therapeutic Interventions/Progress Updates: Pt participated in  Community reintegration/outing to [facility] focused on safe community mobility w/c level, identification & negotiation of obstacles, energy conservation techniques/education.  Pt agreeable to attempt short distance ambulation from w/c to counter to order her smoothie, but only able to take 4 steps with RW with min-mod assist.  Pt expressed feelings of excitement and anxiety during this outing.  Pt with questions about anxiety management.  Extensive pt education provided on factors that contribute to stress/anxiety, factors that protect against stress/anxiety and potential relaxation strategies to assist in coping.  Pt fully receptive to education and shared previous strategies that were helpful to her in the past.  See outing goal sheet in shadow chart for full details.'  Therapy/Group: Community Reintegration  Cher Egnor 10/08/2022, 1:07 PM

## 2022-10-08 NOTE — Progress Notes (Signed)
Physical Therapy Session Note  Patient Details  Name: [name] MRN: [id number] Date of Birth: January 25, 1956  Today's Date: 10/08/2022 PT Individual Time: 1520-1602 PT Individual Time Calculation (min): 42 min   Short Term Goals: Week 4:  PT Short Term Goal 1 (Week 4): STGs = LTGs  Skilled Therapeutic Interventions/Progress Updates:     Pt received supine in bed and agrees to therapy. No complaint of pain. Supine to sit with extended time, cues for logrolling, sequencing, positioning, and hand placement. PT assists with donning shoes while pt is seated at EOB. Pt performs sit to stand from slightly elevated bed with minA to facilitate anterior weight shift. Stand step transfer to [facility] with minA and RW, with cues for positioning and increased eccentric control of stand to sit for safety. Pt performs stand step transfer from [facility] to nustep with same assistance and cues. Pt completes nustep for strength and endurance training. Pt completes x8:00 at workload of 4 with average steps per minute ~30. PT provides cues for hand and foot placement and completing full available ROM. Green theraband utilized to stabilize foot placement on pedals. Stand step from Nustep>WC>bed with minA. ModA for sit to supine with PT managing bilateral lower extremities. Pt left supine with all needs within reach.   Therapy Documentation Precautions:  Precautions Precautions: Fall, Back Precaution Comments: foley Required Braces or Orthoses: Other Brace Other Brace: prevlon boots Restrictions Weight Bearing Restrictions: No Other Position/Activity Restrictions: external rotation R hip at baseline at all times, RLE shorter than LLE, pt ambulated household distances PTA, w/c for community   Therapy/Group: Individual Therapy  Breck Coons, PT, DPT 10/08/2022, 4:50 PM

## 2022-10-08 NOTE — Progress Notes (Signed)
Told pt we would need to cath her.  Pt declined and stated that she did not want it at the time and that she wanted to wait until she had to go.  Pt made aware of need to cath but still declined.

## 2022-10-08 NOTE — Progress Notes (Signed)
Occupational Therapy Weekly Progress Note  Patient Details  Name: SORREL HARTSTEIN MRN: [id number] Date of Birth: Nov 27, 1955  Beginning of progress report period:  End of progress report period: October 08, 2022  Today's Date: 10/08/2022 OT Individual Time: [id number] OT Individual Time Calculation (min): 40 min    Patient has met 3 of 3 short term goals.  [name] continues to progress and has met all her STG's, now approaching upgraded LTG's set at [facility] level. She has more consistent participation and motivation to participate. She can complete UB ADLs with (S), LB ADLs with min A. She has advanced in toileting tasks, only requiring min A now. She is completing transfers at min A - CGA level, occasionally even at (S) level. Family education is ongoing and a community outing is being completed today.   Patient continues to demonstrate the following deficits: muscle weakness, decreased cardiorespiratoy endurance, unbalanced muscle activation and decreased coordination, decreased memory, and decreased standing balance, decreased postural control, and decreased balance strategies and therefore will continue to benefit from skilled OT intervention to enhance overall performance with BADL and iADL.  Patient progressing toward long term goals..  Continue plan of care.  OT Short Term Goals Week 5:  OT Short Term Goal 1 (Week 5): Pt will complete a stand pivot transfer with min A OT Short Term Goal 1 - Progress (Week 5): Met OT Short Term Goal 2 (Week 5): Pt will complete toileting tasks with mod A OT Short Term Goal 2 - Progress (Week 5): Met OT Short Term Goal 3 (Week 5): Pt will complete sit > stand with min A OT Short Term Goal 3 - Progress (Week 5): Met Week 6:  OT Short Term Goal 1 (Week 6): STG= LTG d/t ELOS  Skilled Therapeutic Interventions/Progress Updates:    Pt received supine with no c/o pain at rest, wanting to get to the [facility] to attempt and void. MD present and OT assisted with  answering questions within scope re upcoming family education and d/c planning. She came to EOB with min cueing with (S). She stood with min A, reporting increased stiffness. She completed stand pivot transfer with CGA to the [facility]- premature landing with min A. She sat for several minutes to attempt and void but was unsuccessful. She completed UB ADLs seated on the [facility] with set up assist. She was able to transfer to the w/c following with CGA using the RW. She completed grooming tasks at the sink with set up assist. She returned to EOB and then to supine with min A. She was left supine with all needs met.   Therapy Documentation Precautions:  Precautions Precautions: Fall, Back Precaution Comments: foley Required Braces or Orthoses: Other Brace Other Brace: prevlon boots Restrictions Weight Bearing Restrictions: No Other Position/Activity Restrictions: external rotation R hip at baseline at all times, RLE shorter than LLE, pt ambulated household distances PTA, w/c for community  Therapy/Group: Individual Therapy  Curtis Sites 10/08/2022, 6:03 AM

## 2022-10-09 MED ORDER — PROCHLORPERAZINE MALEATE 5 MG PO TABS
10.0000 mg | ORAL_TABLET | Freq: Every day | ORAL | Status: DC
  Administered 2022-10-10 – 2022-10-13 (×4): 10 mg via ORAL
  Filled 2022-10-09 (×4): qty 2

## 2022-10-09 NOTE — Progress Notes (Signed)
PROGRESS NOTE   Subjective/Complaints:  Pt doing ok today, slept decently. Urinated a small amount but not much, mostly needing to be cathed-- gets uncomfortable in the suprapubic area when she's full. Thinks she wants to put the foley back in and just f/up outpatient with urology.  LBM yesterday, normal.  Pain doing ok. Concerned with what she'll do when she goes home-- who will rx the meds and if she will be coming off of them or not. Advised to discuss with weekday team.  Gets nauseated every morning because of not eating enough with morning meds. Likes the idea of scheduled compazine in the mornings.   Denies any other complaints or concerns at this time.   ROS: +back pain-managed, +nausea . + Bladder fullness sensation, + Urinary retention. Denies fevers, chills, CP, SOB, abd pain, vomiting, diarrhea, constipation, new/worsening paresthesias/weakness, or any other complaints at this time.     Objective:   No results found. No results for input(s): "WBC", "HGB", "HCT", "PLT" in the last 72 hours.      No results for input(s): "NA", "K", "CL", "CO2", "GLUCOSE", "BUN", "CREATININE", "CALCIUM" in the last 72 hours.    Intake/Output Summary (Last 24 hours) at 10/09/2022 1405 Last data filed at 10/09/2022 1202 Gross per 24 hour  Intake 240 ml  Output 1650 ml  Net -1410 ml        Physical Exam: Vital Signs Blood pressure 101/67, pulse 93, temperature 98.8 F (37.1 C), temperature source Oral, resp. rate 16, height 5\' 1"  (1.549 m), weight 65.3 kg, SpO2 97 %.   Constitutional:  thin, NAD, lying in bed, in good spirits HENT: [place]/AT,  MMM today  Eyes: Conjugate gaze conjunctiva clear, PERRL Neck: Trach site well healed Cardiovascular: RRR, soft murmur heard (VSD), no pedal edema Pulmonary: CTAB no w/r/r, no increased WOB Abdominal: Soft, nontender, nondistended, positive bowel sounds/normal active Genitourinary:     Comments:Sacral wound dressing not reassessed today  foley removed Ext: no clubbing, cyanosis, or edema Psych: pleasant, smiling, happy   PRIOR EXAM: Skin: upper back wound just dressed. Did not re-open dressing today. Lower back wound covered. Neurological: Follows commands, some STM deficits. Decreased insight still Alert and oriented x3, no hypertonia noted, CN 2-12 grossly intact. Motor 4/4 UE and 3-4/5 LE prox to distal--stable to improved MSK: LB  remains tender. Tenderness R chest wall, mid-low back    Assessment/Plan: 1. Functional deficits which require 3+ hours per day of interdisciplinary therapy in a comprehensive inpatient rehab setting. Physiatrist is providing close team supervision and 24 hour management of active medical problems listed below. Physiatrist and rehab team continue to assess barriers to discharge/monitor patient progress toward functional and medical goals  Care Tool:  Bathing  Bathing activity did not occur: Refused           Bathing assist       Upper Body Dressing/Undressing Upper body dressing   What is the patient wearing?: Hospital gown only    Upper body assist Assist Level: Total Assistance - Patient < 25%    Lower Body Dressing/Undressing Lower body dressing      What is the patient wearing?: Hospital gown only  Lower body assist Assist for lower body dressing: Total Assistance - Patient < 25%     Toileting Toileting Toileting Activity did not occur Landscape architect and hygiene only): N/A (no void or bm) (using catheter and rectal tube)  Toileting assist Assist for toileting: Dependent - Patient 0%     Transfers Chair/bed transfer  Transfers assist  Chair/bed transfer activity did not occur: Safety/medical concerns  Chair/bed transfer assist level: Maximal Assistance - Patient 25 - 49%     Locomotion Ambulation   Ambulation assist   Ambulation activity did not occur: Safety/medical concerns           Walk 10 feet activity   Assist  Walk 10 feet activity did not occur: Safety/medical concerns        Walk 50 feet activity   Assist Walk 50 feet with 2 turns activity did not occur: Safety/medical concerns         Walk 150 feet activity   Assist Walk 150 feet activity did not occur: Safety/medical concerns         Walk 10 feet on uneven surface  activity   Assist Walk 10 feet on uneven surfaces activity did not occur: Safety/medical concerns         Wheelchair     Assist Is the patient using a wheelchair?: Yes Type of Wheelchair: Manual Wheelchair activity did not occur: Safety/medical concerns         Wheelchair 50 feet with 2 turns activity    Assist    Wheelchair 50 feet with 2 turns activity did not occur: Safety/medical concerns       Wheelchair 150 feet activity     Assist  Wheelchair 150 feet activity did not occur: Safety/medical concerns       Blood pressure 101/67, pulse 93, temperature 98.8 F (37.1 C), temperature source Oral, resp. rate 16, height 5\' 1"  (1.549 m), weight 65.3 kg, SpO2 97 %.  Medical Problem List and Plan: 1. Functional deficits secondary to osteomyelitis, infection of lumbar surgical site complicated by meningitis/ventriculitis.             -pt also with significant deconditioning             -patient may not yet shower -ELOS/Goals: extended to 3/27, pt agreeable.  Min/Mod A goals with PT, OT, SLP  -Continue CIR therapies including PT, OT, and SLP   -Family education this Sunday 10/10/22  2.  Antithrombotics: -DVT/anticoagulation:  Pharmaceutical: Lovenox 40mg  QD             -antiplatelet therapy: N/a  3. Pain Management: Butrans 27mcg/hr patch for pain control w/ Flexeril 5mg  TID>>10mg  TID, Tylenol PRN, Oxycodone 7.5mg  q4h PRN, Gabapentin 100mg  TID  -2/26 tylenol PRN for HA -2/28 Fioricet ordered PRN headache -09/17/22 will change PRN oxycodone 2.5mg  to percocet 5-325mg , consider increase  gabapentin to help with pain and HA- make changes gradually due to hx of encephalopathy  -3/7 Voltaren gel for L shoulder pain -3/8   lidocaine for chest wall tenderness started -3/15- continue flexeril  10mg  tid -She's using oxycodone 5mg  3-4x daily, butrans ptch 15mcg/hr per week as well as gabapentin tid and tylenol and butalbital prn -10/02/22 pain overall managed but breakthrough pain happening more and not as well managed, considered Tramadol but this would not be beneficial per pharmacy due to her butrans patch. Don't want to go up on Oxycodone if we can avoid it; pt agreeable to tracking pain over the weekend, and discussing with weekday  team whether longer acting opioid would be more beneficial with shorter acting PRNs; if pain uncontrolled this weekend, may switch. Monitor for now.  -Ordered lidoderm patches x2 for low back to see if we can get relief that way -10/03/22 pain doing some better, will ask primary team regarding pain regimen but for now she's doing alright.  -3/18 continue current regimen for now, asked nursing to replace butrans patch that has fallen off -10/09/22 pain controlled, curious as to plan for d/c-- who will rx meds, will she wean-- advised to speak with weekday providers.   4. Mood/Behavior/Sleep: LCSW to follow for evaluation and support.             --Trazodone 100mg  QHS and melatonin 5mg  QHS PRN for  insomnia/mood  -09/04/22 placed sign on door to minimize interruptions 11p-5a; changed CBG checks as below to minimize interruptions             -antipsychotic agents:  Luvox 100mg  QHS and Lamictal 200mg  QD  -Continue Aricept 10mg  QD  -Seen by neuropsych-appreciate assistance  -3/1 increase melatonin to 10mg  dose QHS PRN  -3/4 decrease melatonin back to 5mg  dose QHS PRN -3/11 melatonin 5mg  working reasonably well  -3/15 seems to be dealing with extended LOS a bit better today   -team to continue to provide ego support  -10/04/22 discussed with Dr. Lovette Cliche,  psychiatry consult was placed regarding psychiatric medications, depression, anxiety -3/21 Pt with hx of bipolar Disorder ,seen by psychiatry today, psych rec restart wellbutrin Xl 150mg  daily- order placed  5. Neuropsych/cognition: This patient is not fully capable of making decisions on her own behalf. -reviewed cognitive recovery with patient this week. May take until this summer to plateau. Had some mild memory deficits PTA as well.   6. Skin/Wound Care: WOC to follow for wound VAC changes --MASD managed by fecal system-- flexiseal clogged 09/04/22 AM but nursing states it's now working; monitor--might be able to d/c soon? -2/21 WOC stopped VAC, start aquacel and dressing -see flexiseal discussion below, #17 -2/27 WOC following, slough pulled away revealing 2cm deep defect -2/29 Spoke with Dr. Zada Finders today about wound depth, continue local wound care- continue to monitor, continue abx per ID,  no plan for procedure at this time -3/4 Seen by [facility]- continue aquacel, abd pad/tape, apprecaite assistance -3/7 WOC rec medihoney, fill wound with moistened gauza, distal portion of wound to be backed with silver hydrofiber, appreciate assistance -3/8 Dr. Zada Finders contacted about wound, feels like this is doing well overall, continue current wound care orders, appreciate assistance -3/14-15 changed dressing to collagenase to see if we can get better debridement of fibronecrotic tissue. Small area really needs a bit of debridement  -I contacted NS re: f/u wound next week -3/21 seen by [facility] today, recommending consideration of CSWD by NSGY, will discuss with NSGY-- aware  7. Fluids/Electrolytes/Nutrition: Monitor I/O. Continue tube feeds-->likely contributing to diarrhea             -monitor intake closely, continue vitamins -2/19 discontinue core track, Consult dietician -2/27 decrease BMP to every other day -3/14--change labs to weekly -10/01/22 PICC removed  8.Sepsis w/  osteomyelitis/meningitis/encephalitis: Continue Cefepime 2g q8h, Vanc 1250mg  q12h, and Flagyl 500mg  q8h for at least 6 weeks --Vanc+Cefepime complete. Metronidazole has been discontinued as of 09/16/22 (was primarily for PNA, no longer needed) -3/13 --reviewed repeat MRI of brain with resolution! Spoke with NS -3/15 converting to levaquin 750mg  QD and doxycycline 100mg  BID today -f/u with Dr. [name] scheduled  for 3/25--since she'll be here, he or partner can see while in rehab.  -3/19 we will contact Dr. Drucilla Schmidt to see if any alternative antibiotics are available due to nausea -3/20 Dr [name] recommended continue doxycyline, advised pt to take medications with food to minimize nausea -3/21 Pt would like to know if ID wound like to see her inpatient since she has f/u day after discharge scheduled, Called ID office- Dr. [name] will be outpatient that week, Office to call and ask if they would prefer a different date for the ID visit, appreciate assistance   9. H/o PVT:  Monitor BP/heart rate TID--on coreg 12.5mg  BID>>up to 25mg  BID as of 09/10/22, Labetalol 10mg  q2h PRN  -2/23  BP has been a little above goal, increase coreg dose to 25mg  BID  -09/25/22 BP/HR with fair control, continue to monitor -09/26/22 BP running high, restarted Losartan 25mg  QHS (previously on 100mg ) -10/09/22 BP/HR well controlled     10/09/2022    2:58 AM 10/08/2022    8:22 PM 10/08/2022    2:57 PM  Vitals with BMI  Systolic [id number]  Diastolic 67 86 77  Pulse 93 101 93      10. Urinary retention: Has failed 4 voiding trails. Has had foley since 12/23             -maintain foley cath, foley mgt per protocol -- Continue Flomax 0.4mg  qPM. Will d/c urecholine.  -10/02/22 pt and daughter curious when TOV might occur; hoping to get her stronger and able to get to [facility] better before doing so; discussed that this will be a weekday team discussion, may end up going home with it -3/19 voiding trial today -3/20 Has required IC,  continue voiding trial, increase flomax to 0.8mg ,  may need f/u with urology -3/22 Trial urocholine 5mg  TID, consider restart foley tomorrow -10/09/22 initially still not voiding much, but had a larger void after eval today; wants to hold off on foley for now, continue TOV but consider replacement of foley if still having difficulty overnight/this weekend  11. VDRF s/p trach: Tolerating PMSV well -I ordered downsizing to CFS # 4 today--tolerated well -continue ATC w/ pulmonary hygiene -begin day time plugging of trach tomorrow. Decannulate soon. -Trial of capping with respiratory-  can trial 24 hour capping today -2/22 Leave trach uncapped tonight -2/28 plan for decannulation today, has had trach capped for >72hours -2/29 Lurline Idol was  removed, doesn't need bandaid anymore on trach site   12. T2DM: Monitor BS ac/hs and use SSI for elevated BS.               --continue liberalized diet for now. -3/15 no longer checking cbgs per pt/family request although am serum glucose was elevated yesterday-- - resumed metformin 500mg   bid  -consider changing to CM diet at some point -3/18 glucose stable 104   13. Hypokalemia: Recurrent likely due to diarrhea/intake.  -09/18/22 stable K+ 4.2 on 09/17/22, wants to change to BID dosing so switched Klor to 77mEq BID (still will have 56mEq total/day)  -3/18 k+ stable at 4.1  14. Anemia of critical illness: Hgb 7.5-8.9 range -stable at 10.4---f/u 10/11/22    Latest Ref Rng & Units 10/04/2022    7:38 AM 09/27/2022    5:28 AM 09/20/2022    3:44 AM  CBC  WBC 4.0 - 10.5 K/uL 7.1  8.0  7.3   Hemoglobin 12.0 - 15.0 g/dL 10.4  9.9  10.1   Hematocrit 36.0 -  46.0 % 31.5  31.6  31.7   Platelets 150 - 400 K/uL 475  411  419      15. Hyponatremia/hypochloremia:  -09/04/22 Na 129, Cl 95, both stable, continue salt tabs 1g BID -Cortisol testing done during hospitalization (13.8 on 08/30/22), was normal, but daughter and pt want to make sure this is followed up- defer to  weekday team regarding ongoing ?Addison's testing -2/21 129, a little decreased, fluid restriction started -2/28 stable at 131 -09/21/22 Na stable at 130, continue to follow with  q48h BMPs- adjust to 8am labs -Fluid restrictions liberalized to 1598ml -3/11 Na stable at 130, monitor on weekly labs -3/15 will relax to 1800 cc -3/18 stable Na 129  16. Encephalopathy/Delirium: improved substantially  17. Loose stools likely d/t TF and abx.            -recent rectal tube, since dc'ed  -generally improved -09/30/22 LBM --mushy but formed -10/03/22 more formed BMs, LBM 10/02/22, continue monitoring -LBM  10/08/22, cont regimen  18. Hypothyroidism: continue synthroid 74mcg QD  19. Chronic combined CHF, BLE Swelling: recent 2D echo done 08/20/2022 showed apical hypokinesis, LVEF 40-45%, RV function was reduced. Intermittently diuresed during hospitalization, most recently given 40mg  IV lasix on 09/03/22 -09/04/22 noted to still have BLE swelling, start 20mg  PO lasix and monitor lytes/BP/swelling -3/5 wt a little up, monitor tend -3/6 asked nutrition to stop milk/carnation- she wasn't drinking but it was counting toward her fluid restriction -3/7 wt stable, fluid restrictions liberalized to 1572ml -09/25/22 wt stable, monitor -10/01/22 weight stable to decreased. Not sure it's real although po intake has been sporadic -10/02/22 wt uptrending but stable. Monitor  -10/08/22 no signs of fluid overload, wt stabilizing, monitor Filed Weights   10/06/22 2118 10/07/22 0500 10/08/22 0452  Weight: 65.8 kg 60.5 kg 65.3 kg    20. Oral ulcer  --2/28 Orajel for oral ulcer discomfort  21.  Dry mucous membranes, may be related to anticholinergic meds cyclobenzaprine and trazodone, pain and sleep are   better so reluctant to stop at this time  22. Nausea: somewhat chronic, takes meds on empty stomach in the morning, compazine given and helped, continue PRN compazine 5-10mg , encouraged pt to have some food before  meds  -3/9 Continue compazine,  advised to take meds with food in AM -10/09/22 still struggling with having AM nausea d/t inadequate intake for meds-- will order Compazine 10mg  scheduled in the mornings with breakfast.      LOS: 36 days A FACE TO [place] 10/09/2022, 2:05 PM

## 2022-10-10 MED ORDER — GABAPENTIN 100 MG PO CAPS
200.0000 mg | ORAL_CAPSULE | Freq: Three times a day (TID) | ORAL | Status: DC
  Administered 2022-10-10 – 2022-10-12 (×7): 200 mg via ORAL
  Filled 2022-10-10 (×7): qty 2

## 2022-10-10 MED ORDER — OXYCODONE HCL 5 MG PO TABS
2.5000 mg | ORAL_TABLET | Freq: Four times a day (QID) | ORAL | Status: DC
  Administered 2022-10-10 – 2022-10-12 (×7): 2.5 mg via ORAL
  Filled 2022-10-10 (×7): qty 1

## 2022-10-10 NOTE — Progress Notes (Addendum)
PROGRESS NOTE   Subjective/Complaints:  Pt doing well today, excited about family training. Urinated pretty well yesterday, at least twice, and larger volumes than she had been getting. Today feels the urge but hasn't been able to-- bladder scan this morning 653ml. Still wants to try the TOV and avoid foley for now if possible. LBM was yesterday. Slept well.  Pain doing great, and she thinks tapering off oxycodone might be a good thing before going home. Likes the idea of decreasing scheduled oxycodone, leaving PRN percocet in place for now, and then increasing gabapentin a little-- eventual goal would be to taper entirely off oxycodone.  Nausea is doing much better this morning! Denies any other complaints or concerns at this time.   ROS: +back pain-managed, +nausea-improved . + Bladder fullness sensation, + Urinary retention-ongoing. Denies fevers, chills, CP, SOB, abd pain, vomiting, diarrhea, constipation, new/worsening paresthesias/weakness, or any other complaints at this time.     Objective:   No results found. No results for input(s): "WBC", "HGB", "HCT", "PLT" in the last 72 hours.      No results for input(s): "NA", "K", "CL", "CO2", "GLUCOSE", "BUN", "CREATININE", "CALCIUM" in the last 72 hours.    Intake/Output Summary (Last 24 hours) at 10/10/2022 1307 Last data filed at 10/10/2022 1130 Gross per 24 hour  Intake 0 ml  Output 1650 ml  Net -1650 ml        Physical Exam: Vital Signs Blood pressure 124/82, pulse 86, temperature 97.7 F (36.5 C), temperature source Oral, resp. rate 18, height 5\' 1"  (1.549 m), weight 64.9 kg, SpO2 98 %.   Constitutional:  thin, NAD, sitting on BSC and then transferred to bed (amazingly well), in good spirits, excited HENT: [place]/AT,  MMM today  Eyes: Conjugate gaze conjunctiva clear, PERRL Neck: Trach site well healed Cardiovascular: RRR, soft murmur heard (VSD), no pedal  edema Pulmonary: CTAB no w/r/r, no increased WOB Abdominal: Soft, nontender, nondistended, positive bowel sounds/normal active Genitourinary:    Comments:Sacral wound dressing not reassessed today  foley removed MsK: able to transfer from [facility] to bed with minimal assistance, using rolling walker, doing really well, strength improving Ext: no clubbing, cyanosis, or edema Psych: pleasant, smiling, happy, excited   PRIOR EXAM: Skin: upper back wound just dressed. Did not re-open dressing today. Lower back wound covered. Neurological: Follows commands, some STM deficits. Decreased insight still Alert and oriented x3, no hypertonia noted, CN 2-12 grossly intact. Motor 4/4 UE and 3-4/5 LE prox to distal--stable to improved MSK: LB  remains tender. Tenderness R chest wall, mid-low back    Assessment/Plan: 1. Functional deficits which require 3+ hours per day of interdisciplinary therapy in a comprehensive inpatient rehab setting. Physiatrist is providing close team supervision and 24 hour management of active medical problems listed below. Physiatrist and rehab team continue to assess barriers to discharge/monitor patient progress toward functional and medical goals  Care Tool:  Bathing  Bathing activity did not occur: Refused           Bathing assist       Upper Body Dressing/Undressing Upper body dressing   What is the patient wearing?: Hospital gown only    Upper  body assist Assist Level: Total Assistance - Patient < 25%    Lower Body Dressing/Undressing Lower body dressing      What is the patient wearing?: Hospital gown only     Lower body assist Assist for lower body dressing: Total Assistance - Patient < 25%     Toileting Toileting Toileting Activity did not occur Landscape architect and hygiene only): N/A (no void or bm) (using catheter and rectal tube)  Toileting assist Assist for toileting: Dependent - Patient 0%     Transfers Chair/bed  transfer  Transfers assist  Chair/bed transfer activity did not occur: Safety/medical concerns  Chair/bed transfer assist level: Maximal Assistance - Patient 25 - 49%     Locomotion Ambulation   Ambulation assist   Ambulation activity did not occur: Safety/medical concerns          Walk 10 feet activity   Assist  Walk 10 feet activity did not occur: Safety/medical concerns        Walk 50 feet activity   Assist Walk 50 feet with 2 turns activity did not occur: Safety/medical concerns         Walk 150 feet activity   Assist Walk 150 feet activity did not occur: Safety/medical concerns         Walk 10 feet on uneven surface  activity   Assist Walk 10 feet on uneven surfaces activity did not occur: Safety/medical concerns         Wheelchair     Assist Is the patient using a wheelchair?: Yes Type of Wheelchair: Manual Wheelchair activity did not occur: Safety/medical concerns         Wheelchair 50 feet with 2 turns activity    Assist    Wheelchair 50 feet with 2 turns activity did not occur: Safety/medical concerns       Wheelchair 150 feet activity     Assist  Wheelchair 150 feet activity did not occur: Safety/medical concerns       Blood pressure 124/82, pulse 86, temperature 97.7 F (36.5 C), temperature source Oral, resp. rate 18, height 5\' 1"  (1.549 m), weight 64.9 kg, SpO2 98 %.  Medical Problem List and Plan: 1. Functional deficits secondary to osteomyelitis, infection of lumbar surgical site complicated by meningitis/ventriculitis.             -pt also with significant deconditioning             -patient may not yet shower -ELOS/Goals: extended to 3/27, pt agreeable.  Min/Mod A goals with PT, OT, SLP  -Continue CIR therapies including PT, OT, and SLP   -Family education today 10/10/22  2.  Antithrombotics: -DVT/anticoagulation:  Pharmaceutical: Lovenox 40mg  QD             -antiplatelet therapy: N/a  3. Pain  Management: Butrans 35mcg/hr patch for pain control w/ Flexeril 5mg  TID>>10mg  TID, Tylenol PRN, Oxycodone 7.5mg  q4h PRN, Gabapentin 100mg  TID  -2/26 tylenol PRN for HA -2/28 Fioricet ordered PRN headache -09/17/22 will change PRN oxycodone 2.5mg  to percocet 5-325mg , consider increase gabapentin to help with pain and HA- make changes gradually due to hx of encephalopathy  -3/7 Voltaren gel for L shoulder pain -3/8   lidocaine for chest wall tenderness started -3/15- continue flexeril  10mg  tid -She's using oxycodone 5mg  3-4x daily, butrans ptch 57mcg/hr per week as well as gabapentin tid and tylenol and butalbital prn -10/02/22 pain overall managed but breakthrough pain happening more and not as well managed, considered Tramadol but this  would not be beneficial per pharmacy due to her butrans patch. Don't want to go up on Oxycodone if we can avoid it; pt agreeable to tracking pain over the weekend, and discussing with weekday team whether longer acting opioid would be more beneficial with shorter acting PRNs; if pain uncontrolled this weekend, may switch. Monitor for now.  -Ordered lidoderm patches x2 for low back to see if we can get relief that way -10/03/22 pain doing some better, will ask primary team regarding pain regimen but for now she's doing alright.  -3/18 continue current regimen for now, asked nursing to replace butrans patch that has fallen off -10/09/22 pain controlled, curious as to plan for d/c-- who will rx meds, will she wean-- advised to speak with weekday providers.  -10/10/22 interested in weaning, will decrease oxycodone to 2.5mg  q6h scheduled, leave percocet 5-325mg  q4h PRN (she's using it about 3x/day currently), and increase gabapentin to 200mg  TID. Eventual goal is to wean entirely  4. Mood/Behavior/Sleep: LCSW to follow for evaluation and support.             --Trazodone 100mg  QHS and melatonin 5mg  QHS PRN for  insomnia/mood  -09/04/22 placed sign on door to minimize interruptions  11p-5a; changed CBG checks as below to minimize interruptions             -antipsychotic agents:  Luvox 100mg  QHS and Lamictal 200mg  QD  -Continue Aricept 10mg  QD  -Seen by neuropsych-appreciate assistance  -3/1 increase melatonin to 10mg  dose QHS PRN  -3/4 decrease melatonin back to 5mg  dose QHS PRN -3/11 melatonin 5mg  working reasonably well  -3/15 seems to be dealing with extended LOS a bit better today   -team to continue to provide ego support  -10/04/22 discussed with Dr. Lovette Cliche, psychiatry consult was placed regarding psychiatric medications, depression, anxiety -3/21 Pt with hx of bipolar Disorder, seen by psychiatry today, psych rec restart wellbutrin Xl 150mg  daily- order placed  5. Neuropsych/cognition: This patient is not fully capable of making decisions on her own behalf. -reviewed cognitive recovery with patient this week. May take until this summer to plateau. Had some mild memory deficits PTA as well.   6. Skin/Wound Care: WOC to follow for wound VAC changes --MASD managed by fecal system-- flexiseal clogged 09/04/22 AM but nursing states it's now working; monitor--might be able to d/c soon? -2/21 WOC stopped VAC, start aquacel and dressing -see flexiseal discussion below, #17 -2/27 WOC following, slough pulled away revealing 2cm deep defect -2/29 Spoke with Dr. Zada Finders today about wound depth, continue local wound care- continue to monitor, continue abx per ID,  no plan for procedure at this time -3/4 Seen by [place]- continue aquacel, abd pad/tape, apprecaite assistance -3/7 WOC rec medihoney, fill wound with moistened gauza, distal portion of wound to be backed with silver hydrofiber, appreciate assistance -3/8 Dr. Zada Finders contacted about wound, feels like this is doing well overall, continue current wound care orders, appreciate assistance -3/14-15 changed dressing to collagenase to see if we can get better debridement of fibronecrotic tissue. Small area really  needs a bit of debridement  -I contacted NS re: f/u wound next week -3/21 seen by [facility] today, recommending consideration of CSWD by NSGY, will discuss with NSGY-- aware  7. Fluids/Electrolytes/Nutrition: Monitor I/O.              -monitor intake closely, continue vitamins -2/19 discontinue core track, Consult dietician -2/27 decrease BMP to every other day -3/14--change labs to weekly -10/01/22 PICC removed  8.Sepsis w/ osteomyelitis/meningitis/encephalitis: Continue Cefepime 2g q8h, Vanc 1250mg  q12h, and Flagyl 500mg  q8h for at least 6 weeks --Vanc+Cefepime complete. Metronidazole has been discontinued as of 09/16/22 (was primarily for PNA, no longer needed) -3/13 --reviewed repeat MRI of brain with resolution! Spoke with NS -3/15 converting to levaquin 750mg  QD and doxycycline 100mg  BID today -f/u with Dr. [name] scheduled for 3/25--since she'll be here, he or partner can see while in rehab.  -3/19 we will contact Dr. Drucilla Schmidt to see if any alternative antibiotics are available due to nausea -3/20 Dr [name] recommended continue doxycyline, advised pt to take medications with food to minimize nausea -3/21 Pt would like to know if ID wound like to see her inpatient since she has f/u day after discharge scheduled, Called ID office- Dr. [name] will be outpatient that week, Office to call and ask if they would prefer a different date for the ID visit, appreciate assistance -10/10/22 advised family to call office and see if upcoming appt could be postponed, likely would be fine since she'll be on the doxy/levaquin for a few weeks at least.    9. H/o PVT:  Monitor BP/heart rate TID--on coreg 12.5mg  BID>>up to 25mg  BID as of 09/10/22, Labetalol 10mg  q2h PRN  -2/23  BP has been a little above goal, increase coreg dose to 25mg  BID  -09/25/22 BP/HR with fair control, continue to monitor -09/26/22 BP running high, restarted Losartan 25mg  QHS (previously on 100mg ) -3/23-24/24 BP/HR well controlled      10/10/2022    5:31 AM 10/10/2022    4:22 AM 10/09/2022    7:55 PM  Vitals with BMI  Weight 143 lbs 1 oz    Systolic  [id number]  Diastolic  82 72  Pulse  86 97      10. Urinary retention: Has failed 4 voiding trails. Has had foley since 12/23             -maintain foley cath, foley mgt per protocol -- Continue Flomax 0.4mg  qPM. Will d/c urecholine.  -10/02/22 pt and daughter curious when TOV might occur; hoping to get her stronger and able to get to [facility] better before doing so; discussed that this will be a weekday team discussion, may end up going home with it -3/19 voiding trial today -3/20 Has required IC, continue voiding trial, increase flomax to 0.8mg ,  may need f/u with urology -3/22 Trial urocholine 5mg  TID, consider restart foley tomorrow -10/09/22 initially still not voiding much, but had a larger void after eval today; wants to hold off on foley for now, continue TOV but consider replacement of foley if still having difficulty overnight/this weekend -10/10/22 fairly small ISC volumes mostly, voiding on her own with increasing volumes but still needing cath after; continue TOV, hold off on foley for now-- this week will need to finalize plan (learn self cath vs replace foley and f/up with urology once more mobile). Encouraged not to wait until overfull  11. VDRF s/p trach: decannulated 09/15/22 -I ordered downsizing to CFS # 4 today--tolerated well -continue ATC w/ pulmonary hygiene -begin day time plugging of trach tomorrow. Decannulate soon. -Trial of capping with respiratory-  can trial 24 hour capping today -2/22 Leave trach uncapped tonight -2/28 plan for decannulation today, has had trach capped for >72hours -2/29 Lurline Idol was  removed, doesn't need bandaid anymore on trach site   12. T2DM: Monitor BS ac/hs and use SSI for elevated BS.               --  continue liberalized diet for now. -3/15 no longer checking cbgs per pt/family request although am serum glucose was elevated  yesterday-- - resumed metformin 500mg   bid  -consider changing to CM diet at some point -3/18 glucose stable 104   13. Hypokalemia: Recurrent likely due to diarrhea/intake.  -09/18/22 stable K+ 4.2 on 09/17/22, wants to change to BID dosing so switched Klor to 69mEq BID (still will have 13mEq total/day)  -3/18 k+ stable at 4.1  14. Anemia of critical illness: Hgb 7.5-8.9 range -stable at 10.4---f/u 10/11/22    Latest Ref Rng & Units 10/04/2022    7:38 AM 09/27/2022    5:28 AM 09/20/2022    3:44 AM  CBC  WBC 4.0 - 10.5 K/uL 7.1  8.0  7.3   Hemoglobin 12.0 - 15.0 g/dL 10.4  9.9  10.1   Hematocrit 36.0 - 46.0 % 31.5  31.6  31.7   Platelets 150 - 400 K/uL 475  411  419      15. Hyponatremia/hypochloremia:  -09/04/22 Na 129, Cl 95, both stable, continue salt tabs 1g BID -Cortisol testing done during hospitalization (13.8 on 08/30/22), was normal, but daughter and pt want to make sure this is followed up- defer to weekday team regarding ongoing ?Addison's testing -2/21 129, a little decreased, fluid restriction started -2/28 stable at 131 -09/21/22 Na stable at 130, continue to follow with  q48h BMPs- adjust to 8am labs -Fluid restrictions liberalized to 1565ml -3/11 Na stable at 130, monitor on weekly labs -3/15 will relax to 1800 cc -3/18 stable Na 129  16. Encephalopathy/Delirium: improved substantially  17. Loose stools likely d/t TF and abx.            -recent rectal tube, since dc'ed  -generally improved -09/30/22 LBM --mushy but formed -10/03/22 more formed BMs, LBM 10/02/22, continue monitoring -LBM  10/09/22, cont regimen  18. Hypothyroidism: continue synthroid 43mcg QD  19. Chronic combined CHF, BLE Swelling: recent 2D echo done 08/20/2022 showed apical hypokinesis, LVEF 40-45%, RV function was reduced. Intermittently diuresed during hospitalization, most recently given 40mg  IV lasix on 09/03/22 -09/04/22 noted to still have BLE swelling, start 20mg  PO lasix and monitor  lytes/BP/swelling -3/5 wt a little up, monitor tend -3/6 asked nutrition to stop milk/carnation- she wasn't drinking but it was counting toward her fluid restriction -3/7 wt stable, fluid restrictions liberalized to 1582ml -09/25/22 wt stable, monitor -10/01/22 weight stable to decreased. Not sure it's real although po intake has been sporadic -10/02/22 wt uptrending but stable. Monitor  -3/23-24/24 no signs of fluid overload, wt stabilizing, monitor Filed Weights   10/07/22 0500 10/08/22 0452 10/10/22 0531  Weight: 60.5 kg 65.3 kg 64.9 kg    20. Oral ulcer  --2/28 Orajel for oral ulcer discomfort  21.  Dry mucous membranes, may be related to anticholinergic meds cyclobenzaprine and trazodone, pain and sleep are   better so reluctant to stop at this time  22. Nausea: somewhat chronic, takes meds on empty stomach in the morning, compazine given and helped, continue PRN compazine 5-10mg , encouraged pt to have some food before meds  -3/9 Continue compazine,  advised to take meds with food in AM -10/09/22 still struggling with having AM nausea d/t inadequate intake for meds-- will order Compazine 10mg  scheduled in the mornings with breakfast.  -10/10/22 nausea significantly improved, cont scheduled AM compazine and PO intake with AM meds.   I spent >50 minutes discussing multiple aspects of care and discharge plans, adjusting meds for opioid taper,  discussing TOV, and overall coordination of care.   LOS: 37 days A FACE TO [place] 10/10/2022, 1:07 PM

## 2022-10-10 NOTE — Progress Notes (Signed)
Occupational Therapy Session Note  Patient Details  Name: [name] MRN: [id number] Date of Birth: [birth year]  Today's Date: 10/10/2022 OT Individual Time: 1345-1429 OT Individual Time Calculation (min): 44 min    Short Term Goals: Week 6:  OT Short Term Goal 1 (Week 6): STG= LTG d/t ELOS  Skilled Therapeutic Interventions/Progress Updates:     Pt received supine in bed with husband, son, and DTR present in room upon OT arrival prepared for family education. Focused session on functional transfer training and BADL retraining. Pt reporting head ache with RN entering room to provide medications at beginning of session and OT offering repositioning, rest breaks, and therapeutic encouragement for pain management. Pt reporting fatigue requiring increased time to initiate participation and extended rest breaks throughout session. Discussed home set-up with suggestions provided on positioning for bed and BSC to increased Pt independence and safety. Educated family on cues to provide Pt and safe handling techniques. Pt family members continue to express concerns of Pt falling at home d/t miscommunication during transfers or Pt impulsively sitting/standing without warning. Dicussed importance of communicating with each other, increasing time provided for tasks, and taking frequent rest breaks. Educated family on fall recovery and strategies to protect both Pt and family member if Pt were to loose her balance or suddenly become fatigues. Encouraged placing a chair in each room of the house to provide spaces for rest breaks with Pt and family members receptive to education. Provided demonstration of BSC and shower chair transfer. Allowed opportunity for husband to assist with [facility] and shower transfer with skilled feedback provided following. Pt continues to be nervous entering shower with single practice opportunity provided this session (pt would benefit from increased practice prior to d/c). Discussed  energy conservation and fall prevention with Pt and family able to verbalize strategies to utilize upon d/c. Pt and family receptive to all education this session. Pt left resting in bed with call bell in reach, no bed alarm d/t family present in room, and all needs met.   Therapy Documentation Precautions:  Precautions Precautions: Fall, Back Precaution Comments: foley Required Braces or Orthoses: Other Brace Other Brace: prevlon boots Restrictions Weight Bearing Restrictions: No Other Position/Activity Restrictions: external rotation R hip at baseline at all times, RLE shorter than LLE, pt ambulated household distances PTA, w/c for community  Therapy/Group: Individual Therapy  Janey Genta 10/10/2022, 8:01 AM

## 2022-10-10 NOTE — Progress Notes (Signed)
Speech Language Pathology Daily Session Note  Patient Details  Name: [name] MRN: [id number] Date of Birth: 01-12-1956  Today's Date: 10/10/2022 SLP Individual Time: [id number] SLP Individual Time Calculation (min): 25 min  Short Term Goals: Week 5: SLP Short Term Goal 1 (Week 5): STG's = LTG's due to ELOS SLP Short Term Goal 1 - Progress (Week 5): Progressing toward goal  Skilled Therapeutic Interventions:   Pt seen for skilled SLP session to address cognitive-linguistic goals. Family present for family education. Pt continues to use memory book as compensatory aid, though it is disorganized. We discussed various external memory aids that pt can incorporate at home including a structured routine, daily schedule, calendar, and family schedule. Good understanding verbalized and no further questions from family at this time. Pt left reclined in bed with bed alarm set and family at bedside. Continue SLP PoC.   Pain Pain Assessment Pain Scale: 0-10 Pain Score: 0-No pain Faces Pain Scale: No hurt Pain Type: Surgical pain Pain Location: Back Pain Orientation: Mid;Lower Pain Descriptors / Indicators: Aching Pain Frequency: Constant Pain Onset: On-going Patients Stated Pain Goal: 0 Pain Intervention(s): Medication (See eMAR) Multiple Pain Sites: No  Therapy/Group: Individual Therapy  Wyn Forster 10/10/2022, 3:50 PM

## 2022-10-10 NOTE — Progress Notes (Signed)
Patient and family educated on wound dressing mid posterior (back) wound.   No further questions.   Patient was then slid up in the bed, call-bell within reach, and bed in lowest position.

## 2022-10-10 NOTE — Progress Notes (Signed)
Physical Therapy Session Note  Patient Details  Name: [name] MRN: [id number] Date of Birth: Apr 04, 1956  Today's Date: 10/10/2022 PT Individual Time: 1300-1349 PT Individual Time Calculation (min): 49 min   Short Term Goals: Week 4:  PT Short Term Goal 1 (Week 4): STGs = LTGs  Skilled Therapeutic Interventions/Progress Updates:      Therapy Documentation Precautions:  Precautions Precautions: Fall, Back Precaution Comments: foley Required Braces or Orthoses: Other Brace Other Brace: prevlon boots Restrictions Weight Bearing Restrictions: No Other Position/Activity Restrictions: external rotation R hip at baseline at all times, RLE shorter than LLE, pt ambulated household distances PTA, w/c for community  Pt received semi-reclined in bed with family present for education. Pt with unrated back and left shoulder pain with mobility, provided rest and repositioning for relief.   Pt's son requesting education on bed mobility and transfers, while pt's daughter requesting training on car transfers. Pt's spouse also present. Pt's son with concern's of patients ability to direct her care as pt with impaired memory, decreased activity tolerance and impulsivity with transfers and gait. PT educated pt and family on RPE scale to monitor fatigue with mobility upon discharge at home for safety. Pt also encouraged to advocate and direct her care as much as possible.   Pt performed supine to sit via log roll technique. Pt mobilized LE's to edge of bed, rolled to the right with use of bed rail and required min A for steadying with transition from S/L to sitting edge of bed. Pt initially requires min A for posterior lean in sitting and fades to close (S). After pt performed transfer family with concern regarding bed mobility as pt sleeps on left side of bed at home and has primarily practiced bed mobility exiting on the right side of the hospital bed. Pt transitioned from sit to lying with min A  for LE management. Pt attempted supine to sit to the left and unable 2/2 L shoulder pain. Family and pt discussed predicament and plan to reorganize pt's room to allow her to exit bed on the right side.   Pt reports feeling overwhelmed by presence of multiple family members and asked to perform family training at another time. PT provided emotional support and requested if okay for PT to demonstrate bed mobility, transfers, and car transfers in gym with family members for remainder of session and pt agreeable.   PT demonstrated supine<>sit, rolling, and re-adjusting in bed. PT also educated pt's family on hand placement with RW with transfers and car transfer by either ambulatory transfer, stand pivot, or squat pivot method.   Pt left semi-reclined in bed with all needs in reach and family present.    Therapy/Group: Individual Therapy  Verl Dicker Verl Dicker PT, DPT  10/10/2022, 12:17 PM

## 2022-10-11 LAB — CBC
HCT: 33.2 % — ABNORMAL LOW (ref 36.0–46.0)
Hemoglobin: 11 g/dL — ABNORMAL LOW (ref 12.0–15.0)
MCH: 30.6 pg (ref 26.0–34.0)
MCHC: 33.1 g/dL (ref 30.0–36.0)
MCV: 92.2 fL (ref 80.0–100.0)
Platelets: 416 10*3/uL — ABNORMAL HIGH (ref 150–400)
RBC: 3.6 MIL/uL — ABNORMAL LOW (ref 3.87–5.11)
RDW: 16 % — ABNORMAL HIGH (ref 11.5–15.5)
WBC: 7.3 10*3/uL (ref 4.0–10.5)
nRBC: 0 % (ref 0.0–0.2)

## 2022-10-11 LAB — BASIC METABOLIC PANEL
Anion gap: 8 (ref 5–15)
BUN: 6 mg/dL — ABNORMAL LOW (ref 8–23)
CO2: 24 mmol/L (ref 22–32)
Calcium: 9.4 mg/dL (ref 8.9–10.3)
Chloride: 99 mmol/L (ref 98–111)
Creatinine, Ser: 0.43 mg/dL — ABNORMAL LOW (ref 0.44–1.00)
GFR, Estimated: >60 mL/min (ref >60)
Glucose, Bld: 84 mg/dL (ref 70–99)
Potassium: 4.1 mmol/L (ref 3.5–5.1)
Sodium: 131 mmol/L — ABNORMAL LOW (ref 135–145)

## 2022-10-11 MED ORDER — BUPRENORPHINE 5 MCG/HR TD PTWK
1.0000 | MEDICATED_PATCH | TRANSDERMAL | Status: DC
  Administered 2022-10-11: 1 via TRANSDERMAL
  Filled 2022-10-11: qty 1

## 2022-10-11 MED ORDER — MUPIROCIN 2 % EX OINT: TOPICAL_OINTMENT | Freq: Two times a day (BID) | CUTANEOUS | Status: DC

## 2022-10-11 MED ORDER — ONDANSETRON HCL 4 MG PO TABS: 4.0000 mg | ORAL_TABLET | Freq: Three times a day (TID) | ORAL | Status: DC | PRN

## 2022-10-11 NOTE — Progress Notes (Signed)
Physical Therapy Session Note  Patient Details  Name: [name] MRN: [id number] Date of Birth: 08-29-1955  Today's Date: 10/11/2022 PT Individual Time: 1400-1445 PT Individual Time Calculation (min): 45 min  and Today's Date: 10/11/2022 PT Missed Time: 30 Minutes Missed Time Reason: Other (Comment) (meeting with home health agency)  Short Term Goals: Week 4:  PT Short Term Goal 1 (Week 4): STGs = LTGs  Skilled Therapeutic Interventions/Progress Updates:     Pt received supine in bed and agrees to therapy. No complaint of pain. Supine to L sidelying to sit with extended time and cues for sequencing, body mechanics, and positioning. Pt performs sit to stand and stand step to [facility] with close supervision and cues for safe AD management and positioning. WC transport to gym for time management. Pt completes transfer to car with minA and RW, with PT manually assisting lower extremities. During transfer back to [facility] pt has LOB backward and requires modA to safely guide hips back to [place]. PT educates on importance of waiting until legs are touching WC prior to attempting to sit, and educates son on importance of guarding and using gait belt when assisting pt at home. Pt's husband arrives. PT then demonstrates shower transfer, then pt completes with CGA/minA and cues for sequencing and positioning. Pt returns to room for meeting with home health agency> Pt misses 30 minutes of skilled PT for meeting.  Therapy Documentation Precautions:  Precautions Precautions: Fall, Back Precaution Comments: foley Required Braces or Orthoses: Other Brace Other Brace: prevlon boots Restrictions Weight Bearing Restrictions: No Other Position/Activity Restrictions: external rotation R hip at baseline at all times, RLE shorter than LLE, pt ambulated household distances PTA, w/c for community   Therapy/Group: Individual Therapy  Breck Coons, PT, DPT 10/11/2022, 5:14 PM

## 2022-10-11 NOTE — Progress Notes (Addendum)
Patient ID: VICENTA MCGANNON, female   DOB: 12-25-1955, 67 y.o.   MRN: [id number]  [place] with husband to see how education went on Sunday with he and their children. He reports it went great and feel she is doing well. Asked if felt needed more education and could schedule for tomorrow and he responded no. Have asked kelly-liaison from [facility] who is coming to introduce self to pt today to call husband so he can have his questions answered. She is too call him. He reports they will be up here at 2;30 to meet with the agency person to discuss coverage for home-private duty. Working toward discharge Wednesday.  2:00 PM Saw daughter and son here and asked how education went Sunday. They felt needed to work mor eon car transfers since will be taking to follow up appointments. Asked Carr-PT to address this and he planned to in his session with her today. Son and pt's husband here for this. Kelly-[facility] liaison was in to answer their questions and give over view regarding home health. Claiborne Billings to call husband also since not here when met with pt and son. Also told family is meeting with Private duty agency today at 2:30 pm regarding hiring care for pt at home.

## 2022-10-11 NOTE — Progress Notes (Signed)
Physical Therapy Session Note  Patient Details  Name: [name] MRN: [id number] Date of Birth: [birth year]  Today's Date: 10/11/2022 PT Individual Time: [id number] PT Individual Time Calculation (min): 45 min   Short Term Goals: Week 4:  PT Short Term Goal 1 (Week 4): STGs = LTGs  Skilled Therapeutic Interventions/Progress Updates:    Pt presents in room in bed with pt daughter [name] present and agreeable to PT. Pt reports pain manageable at this time but does not provide NPRS. Pt completes bed mobility with elevated HOB with increased time however managements BLEs off bed without assist, trunk to upright with supervision/CGA maintaining spinal precautions. Pt completes sit to stand with CGA from slightly elevated EOB and completes stand step transfer with RW CGA. Pt transported to day room dependently for time management. Pt then ambs 10' to chair without arms with RW CGA demonstrating step to gait pattern secondary to leg length discrepancy, completes 2nd trial amb back to [facility] placed 15' away with RW CGA with slower gait speed noted. Pt requires increased time for rest break between gait trials secondary to muscle fatigue and requires min assist for sit to stand for chair without arms. Pt then transfers CGA for stand pivot transfer WC<>nustep and participates with interval training with BLEs only secondary to pt reporting shoulder pain following previous trial with nustep with parameters: Level 1 workload, 18min working/66min rest for 4 rounds total, utilized green theraband for foot placement on nustep for activity. Following activity pt transported back to room dependently in [facility], completes stand pivot transfer with CGA RW WC>bed and completes sit to supine with flat hospital bed modA from pt daughter for BLE management into bed. Pt requires verbal cueing and min assist for repositioning once supine. Pt maintains supine in bed with all needs within reach, call light in place, and daughter and RN  present in room at end of session.  Therapy Documentation Precautions:  Precautions Precautions: Fall, Back Precaution Comments: foley Required Braces or Orthoses: Other Brace Other Brace: prevlon boots Restrictions Weight Bearing Restrictions: No Other Position/Activity Restrictions: external rotation R hip at baseline at all times, RLE shorter than LLE, pt ambulated household distances PTA, w/c for community    Therapy/Group: Individual Therapy  Lorna Dibble 10/11/2022, 12:00 PM

## 2022-10-11 NOTE — Progress Notes (Signed)
Speech Language Pathology Daily Session Note  Patient Details  Name: KAMILL BOLEYN MRN: [id number] Date of Birth: [birth year]  Today's Date: 10/11/2022 SLP Individual Time: 1115-1150 SLP Individual Time Calculation (min): 35 min and Today's Date: 10/11/2022 SLP Missed Time: 25 Minutes Missed Time Reason: Toileting (speaking with physician)  Short Term Goals: Week 5: SLP Short Term Goal 1 (Week 5): STG's = LTG's due to ELOS SLP Short Term Goal 1 - Progress (Week 5): Progressing toward goal  Skilled Therapeutic Interventions:   Pt seen for skilled SLP session to address functional cognitive-linguistic goals. Pt missed 25 minutes of SLP session due to bathroom needs and physician present at end of session. Initiated medication management task with review of medications in EMR. Pt started completing med chart with med name, time of day, number of doses, and reason for med, with set up and min assistance for re-direction. Will plan to complete exercise in upcoming session. Family reported that they will manage meds for pt to start, though long term goal is for pt to manage medications independently. Pt left sitting in bed with family at bedside. Continue SLP PoC.   Pain Pain Assessment Pain Scale: 0-10 Pain Score: 0-No pain Faces Pain Scale: No hurt  Therapy/Group: Individual Therapy  Wyn Forster 10/11/2022, 11:59 AM

## 2022-10-11 NOTE — Progress Notes (Addendum)
[facility] for Infect[facility] Disease    Date of Admission:  09/03/2022     [name] is a 67 y.o. female with hx of scoliosis surgery with extensive lower thoracic and lumbar fusion roughly 35yrs ago, she suffered ground level fall in late November, where she fractures through all three columns of L3, thus went to OR on 12/9 for stabilization, new  posterolateral instrumentation fusion on December 9, [id number] complicated by Wound dehiscence with exposed hardware and unstable lumbar spine fracture status post revision of lumbar wound with removal of L1-3 4 and 5 lumbar screws and rod and removal of right L1 screw and revision of L1-L5 posterior instrumented fusion on August 09, 2022 and now concern for postoperative wound infection and ventriculitis. She had complicated protracted course in the icu, but eventually was discharged to CIR on vancomycin and cefepime through 3/14 for ventriculitis and hw complicating wound infection. Now changed to doxcycyline plus levofloxacin as of 3/15 while she continues to need medihoney with moistened quaze with sliver hydrofiber to the caudal portion of wound.  Mri of brain on 09/29/22- resolution of ventriculitis. She is being discharged tomorrow from CIR to home, and having home PT/OT. She states that her wound dressing is changed daily. She is gaining better function, ambulating with walker, has done "mini" steps in the CIR gym.  Has some nausea and vomiting with doxycycline on empty stomach  Principal Problem:   Infection of lumbar spine (HCC) Active Problems:   VENTRICULAR SEPTAL DEFECT, CONGENITAL   Anxiety   Diabetes mellitus, type 2 (HCC)   Acid reflux   Mild cognitive impairment   Mood disorder (HCC)   Wound dehiscence   Encephalopathy   Malnutrition of moderate degree   Bacterial meningitis   Hardware complicating wound infection ([place])   Acute blood loss anemia   Chronic combined systolic and diastolic CHF (congestive heart failure)  (HCC)   Chronic midline thoracic back pain   Primary insomnia   Chronic wound   Hyperglycemia   Adjustment disorder with mixed anxiety and depressed mood   Bipolar disorder, in partial remission, most recent episode depressed (HCC)    Subjective: Afebrile. Occ nausea with abtx.  Medications:   bethanechol  5 mg Oral TID   buprenorphine  1 patch Transdermal Weekly   buPROPion  150 mg Oral Daily   carvedilol  25 mg Oral BID WC   cyclobenzaprine  10 mg Oral TID   diclofenac Sodium  2 g Topical QID   donepezil  10 mg Oral Daily   doxycycline  100 mg Oral Q12H   enoxaparin (LOVENOX) injection  40 mg Subcutaneous Q24H   famotidine  20 mg Oral BID   feeding supplement  237 mL Oral TID BM   fiber  1 packet Oral BID   fluvoxaMINE  100 mg Oral QHS   furosemide  20 mg Oral Daily   gabapentin  200 mg Oral TID   [name]'s butt cream   Topical Daily   lamoTRIgine  200 mg Oral Daily   levofloxacin  750 mg Oral Daily   levothyroxine  88 mcg Oral Q0600   lidocaine  2 patch Transdermal Q24H   losartan  25 mg Oral QHS   metFORMIN  500 mg Oral BID WC   multivitamin with minerals  1 tablet Oral Daily   oxyCODONE  2.5 mg Oral Q6H   potassium chloride  30 mEq Oral BID   prochlorperazine  10 mg Oral Q breakfast  saccharomyces boulardii  250 mg Oral BID   sodium chloride  1 g Oral BID WC   tamsulosin  0.8 mg Oral QPC supper   traZODone  100 mg Oral QHS    Objective: Vital signs in last 24 hours: Temp:  [97.7 F (36.5 C)-98 F (36.7 C)] 97.7 F (36.5 C) (03/25 0359) Pulse Rate:  [81-88] 81 (03/25 0359) Resp:  [16-18] 18 (03/25 0359) BP: (119-125)/(77-80) 119/77 (03/25 0359) SpO2:  [99 %-100 %] 100 % (03/25 0359) Weight:  [60.7 kg-65.3 kg] 60.7 kg (03/25 [id number])  Physical Exam  Constitutional:  oriented to person, place, and time. appears well-developed and well-nourished. No distress.  HENT: [place]/AT, PERRLA, no scleral icterus Mouth/Throat: Oropharynx is clear and moist. No  oropharyngeal exudate.  Cardiovascular: Normal rate, regular rhythm and normal heart sounds. Exam reveals no gallop and no friction rub. +systolic murmur, known vsd Pulmonary/Chest: Effort normal and breath sounds normal. No respiratory distress.  has no wheezes.  Neck = supple, no nuchal rigidity Abdominal: Soft. Bowel sounds are normal.  exhibits no distension. There is no tenderness.  Back: see picture in media- 2.3cm in length. Difficult to assess depth but it is at least covering the head of qtip Lymphadenopathy: no cervical adenopathy. No axillary adenopathy Neurological: alert and oriented to person, place, and time.  Skin: Skin is warm and dry. No rash noted. No erythema. Scattered echymosis Psychiatric: a normal mood and affect.  behavior is normal.    Lab Results Recent Labs    10/11/22 0858  WBC 7.3  HGB 11.0*  HCT 33.2*  NA 131*  K 4.1  CL 99  CO2 24  BUN 6*  CREATININE 0.43*    Microbiology: MSSA COLONIZED Studies/Results: No results found.   Assessment/Plan: Recommend to continue on oral abtx of doxy and levo Will check sed rate and crp\  Continue with local wound care as directed by wound care RN  Can take doxycycline with food, and also give rx for zofran to help if still needed  She is roughly 2 months out since her last surgery  We will have her continue on oral abtx for additional 4 wk and follow up in the ID clinic on 11/02/22 to decide if need to continue further.  I have personally spent 50 minutes involved in face-to-face and non-face-to-face activities for this patient on the day of the visit. Professional time spent includes the following activities: Preparing to see the patient (review of tests), Obtaining and/or reviewing separately obtained history (admission/discharge record), Performing a medically appropriate examination and/or evaluation , Ordering medications/tests/procedures, referring and communicating with other health care  professionals, Documenting clinical information in the EMR, Independently interpreting results (not separately reported), Communicating results to the patient/family/caregiver, Counseling and educating the patient/family/caregiver and Care coordination (not separately reported).     [facility] Pager: [phone]  10/11/2022, 3:39 PM

## 2022-10-11 NOTE — Discharge Summary (Signed)
Physician Discharge Summary  Patient ID: [name] MRN: [id number] DOB/AGE: 08-08-1955 67 y.o.  Admit date: 09/03/2022 Discharge date: 10/13/2022  Discharge Diagnoses:  Principal Problem:   Infection of lumbar spine ([place]) Active Problems:   VENTRICULAR SEPTAL DEFECT, CONGENITAL   Anxiety   Diabetes mellitus, type 2 (HCC)   Acid reflux   Urinary retention   Mild cognitive impairment   Wound dehiscence   Malnutrition of moderate degree   Hardware complicating wound infection ([place])   Acute blood loss anemia   Chronic combined systolic and diastolic CHF (congestive heart failure) (HCC)   Chronic midline thoracic back pain   Primary insomnia   Adjustment disorder with mixed anxiety and depressed mood   Bipolar disorder, in partial remission, most recent episode depressed (HCC)   Anemia of chronic illness   Discharged Condition: stable  Significant Diagnostic Studies: MR BRAIN W WO CONTRAST  Result Date: 09/29/2022 CLINICAL DATA:  Follow-up ventriculitis. EXAM: MRI HEAD WITHOUT AND WITH CONTRAST TECHNIQUE: Multiplanar, multiecho pulse sequences of the brain and surrounding structures were obtained without and with intravenous contrast. CONTRAST:  51mL GADAVIST GADOBUTROL 1 MMOL/ML IV SOLN COMPARISON:  08/31/2022 FINDINGS: Brain: Resolved debris by diffusion and FLAIR imaging at the level of the ventricles. Allowing for motion artifact no sulcal debris is seen either. Resolved ependymal enhancement. Chronic small vessel ischemia in the cerebral white matter and to a greater extent in the pons. No acute or subacute infarct, hydrocephalus, or collection. Vascular: Mid major flow voids and vascular enhancements are preserved Skull and upper cervical spine: Normal marrow signal Sinuses/Orbits: Unremarkable Other: Intermittent pervasive motion artifact IMPRESSION: Resolved ventriculitis findings.  No new abnormality. Motion artifact Electronically Signed   By: Jorje Guild M.D.   On:  09/29/2022 11:16   DG Chest 2 View  Result Date: 09/22/2022 CLINICAL DATA:  Previous abnormal chest x-ray, history of left pleural effusion EXAM: CHEST - 2 VIEW COMPARISON:  09/03/2022 FINDINGS: Single frontal view of the chest demonstrates stable right-sided PICC. The tracheostomy tube and enteric catheter seen previously are no longer identified. Cardiac silhouette is stable. Increased density in the retrocardiac region consistent with chronic eventration of the left hemidiaphragm. Consolidation at the left lung base most consistent with compressive atelectasis as seen on prior CT. No definite airspace disease on this exam. No large effusion or pneumothorax. No acute bony abnormality. Stable left shoulder arthroplasty. IMPRESSION: 1. Chronic elevation of the left hemidiaphragm with compressive atelectasis at the left lung base. 2. No acute intrathoracic process. Electronically Signed   By: Randa Ngo M.D.   On: 09/22/2022 16:27    Labs:  Basic Metabolic Panel:    Latest Ref Rng & Units 10/11/2022    8:58 AM 10/04/2022    7:38 AM 09/30/2022    8:21 AM  BMP  Glucose 70 - 99 mg/dL 84  104  176   BUN 8 - 23 mg/dL 6  7  7    Creatinine 0.44 - 1.00 mg/dL 0.43  0.45  0.38   Sodium 135 - 145 mmol/L 131  129  130   Potassium 3.5 - 5.1 mmol/L 4.1  4.1  4.0   Chloride 98 - 111 mmol/L 99  96  98   CO2 22 - 32 mmol/L 24  24  23    Calcium 8.9 - 10.3 mg/dL 9.4  9.3  9.1      CBC:    Latest Ref Rng & Units 10/11/2022    8:58 AM 10/04/2022  7:38 AM 09/27/2022    5:28 AM  CBC  WBC 4.0 - 10.5 K/uL 7.3  7.1  8.0   Hemoglobin 12.0 - 15.0 g/dL 11.0  10.4  9.9   Hematocrit 36.0 - 46.0 % 33.2  31.5  31.6   Platelets 150 - 400 K/uL 416  475  411      CBG: No results for input(s): "GLUCAP" in the last 168 hours.  Brief HPI:   [name] is a 67 y.o. female with history of PVT, T2DM, bipolar d/o, mild cognitive impairments, right femur fracture with RLE leg length discrepancy and gait disorder,  fall 05/2022 with 3 column unstable fracture through prior scoliosis construct s/p instrumentation with exposed hardware and wound dehiscence. She was admitted on 08/09/22 for revision of lumbar wound with removal of hardware and revision L1-L5 fusion by Dr. Zada Finders. Post op course was significant for delirium with fevers, PNA with respiratory failure requiring intubation 2/01. MRI brain showed evidence of ventriculitis and MRI spine showed widely patent fusion with decrease in bone marrow edema L3.   Dr. [name] recommended IV antibiotics X 6 weeks empirically with flagyl to cover aspiration PNA. She required tracheostomy as well as foley for urinary retention and rectal tube due to frequent loose stools and to prevent wound contamination. WOC was following for wound VAC changes. She was downsized to CFS#4 prior prior to d/c. PT/OT was working with patient who was limited by weakness, cognitive deficits requiring increased time for processing as well as verbal and tactile cues to follow simple commands as well as max to total assist with ADLs and mobility. CIR was recommended due to functional decline.     Hospital Course: [name] was admitted to rehab 09/03/2022 for inpatient therapies to consist of PT, ST and OT at least three hours five days a week. Past admission physiatrist, therapy team and rehab RN have worked together to provide customized collaborative inpatient rehab. Her blood pressures were monitored on TID basis and have been stable. As mentation and swallow function improved, diet was advanced to regular textures. She was tolerating PMSV and was decannulated without difficulty. Her diabetes has been monitored with ac/hs CBG checks and was being managed with SSI due to variable intake and was reasonably controlled. As swallow function improved, she was advanced to regular textures, intake has improved with family providing food from home. She did request resumption of metformin and  d/c of CBG checks. She is tolerating metformin without SE except for am nausea which could be due to transition to oral antibiotics. Patient has been educated on importance of BS management to promote wound healing and recommended to  monitor this post d/c. Her CBG checks d/c as BS controlled and have been monitored weekly with weekly labs.   She was maintained on Vanc/cefepime thru 03/14 and as follow up MRI brain 03/13 showed  resolution of ventriculitis, she was transitioned to Levaquin and doxycycline bid per ID input. She is to continue on oral antibiotics X 4 weeks and follow up at [place] on 04/16 for final decision on duration of antibiotic treatment. WOC has been following to give input on patient's wound care and for monitoring. Wound VAC was discontinued on 02/21 and wet to dry dressing changes with Aquacel Ag was initiated. She did develop some slough on wound bed and metahoney added initially but changed to santyl which has helped to debride it. Patient did report that she plans  on showering at home and  WOC recommended changing back to Aquacel for antibacterial protection.   Ensure and protein supplements added to help promote wound healing as as mentation improved but she had declined these frequently.  Voiding trial was attempted with increase in tamsulosin as well as addition of urecholine. She continued to require I/O caths and patient elected on  foley with plans for voiding trial on outpatient basis.  She has had high levels of anxiety and ego support has been provided by rehab team. Her mentation has improved with cognition trending back to baseline. She expressed concerns about her psychiatric medication regimen as multiple meds has been d/c while on acute and requested psychiatry input. Her home Wellbutrin was resumed on 03/21 and Dr. Leverne Humbles who evaluated patient felt that patient was at her baseline and no additional meds were recommended.   Pain control is improving and she has been  educated on importance of weaning narcotics gradually. She continues Butran's patch as well as low dose gabapentin as sedation was reported with attempts at up titration of latter.   She started making progress and LOS was extended by a week to help achieve higher goals. She requires min assist with ADL and mobility. She will continue to receive follow up [place] [place], [facility] and [place] by [facility] after discharge.    Rehab course: During patient's stay in rehab weekly team conferences were held to monitor patient's progress, set goals and discuss barriers to discharge. At admission, patient required +2 total assist with ADL tasks and mod to total assist with pregait activity. She exhibited deficits in recall, attention, insight and required min to mod assist for processing and attention to tasks. She  has had improvement in activity tolerance, balance, postural control as well as ability to compensate for deficits. She has had improvement in functional use RUE and RLE as well as improvement in awareness. She requires min assist with ADL tasks.  She requires CGA for transfers and to ambulate 42 feet with RW and cues for sequencing.  She is able to complete functional cognitive tasks with min assist and requires min cues to self correct errors and for re-direction to tasks. Family education has been completed and family has hired caregivers to assist with care.    Incisional Wound care:  Cleanse wound to lumbar spine with NS and pat dry.  2.  Using Q Tip, apply EITHER  medihoney or santyl to yellow tissue.   3.  Cut a thin strip of Aquacel and pack into wound depth (tunnels up at 12 o'clock) 4.  Cover wound with silicone foam dressing to protect and pad bony prominence    Disposition:  Home.   Diet: Carb modified.   Special Instructions: Recommend Vit C, Zinc and protein supplements bid to help promote wound healing.  2.   Continue pressure relief measures. Desitin to MASD.  3.    Foley care BID and after BM. 4.  Monitor BS ac/hs and follow up with PCP for further input.  5.  Change butran's patch weekly on Mondays.    Discharge Instructions     Ambulatory referral to Physical Medicine Rehab   Complete by: As directed       Allergies as of 10/13/2022   No Known Allergies      Medication List     STOP taking these medications    bethanechol 10 MG tablet Commonly known as: URECHOLINE   ceFEPIme 2 g in sodium chloride 0.9 % 100 mL   feeding  supplement (GLUCERNA 1.5 CAL) Liqd   feeding supplement Liqd   loperamide 2 MG capsule Commonly known as: IMODIUM   metroNIDAZOLE 500 MG/100ML Commonly known as: FLAGYL   multivitamin with minerals Tabs tablet   oxyCODONE HCl 7.5 MG Tabs   Ozempic (2 MG/DOSE) 8 MG/3ML Sopn Generic drug: Semaglutide (2 MG/DOSE)   pantoprazole 40 MG tablet Commonly known as: PROTONIX   QUEtiapine 300 MG tablet Commonly known as: SEROQUEL   rosuvastatin 20 MG tablet Commonly known as: CRESTOR   traMADol 50 MG tablet Commonly known as: ULTRAM   vancomycin 1250 MG/250ML Soln Commonly known as: VANCOREADY       TAKE these medications    acetaminophen 325 MG tablet Commonly known as: TYLENOL Take 1-2 tablets (325-650 mg total) by mouth every 6 (six) hours as needed for mild pain or headache. What changed:  medication strength how much to take when to take this reasons to take this additional instructions   Acidophilus Caps capsule Take 1 capsule (100 mg total) by mouth 2 (two) times daily.   buprenorphine 5 MCG/HR Ptwk--Rx # 4 patches   Commonly known as: BUTRANS Place 1 patch onto the skin once a week.   buPROPion 150 MG 24 hr tablet Commonly known as: WELLBUTRIN XL Take 1 tablet (150 mg total) by mouth daily.   carvedilol 25 MG tablet Commonly known as: COREG Take 1 tablet (25 mg total) by mouth 2 (two) times daily with a meal. What changed:  medication strength how much to take how to take  this   cyclobenzaprine 10 MG tablet Commonly known as: FLEXERIL Take 1 tablet (10 mg total) by mouth 3 (three) times daily. What changed:  medication strength how much to take additional instructions   diclofenac Sodium 1 % Gel Commonly known as: VOLTAREN Apply 2 g topically 4 (four) times daily. Notes to patient: Apply to left shoulder at least 3 times a day for it to be affective. You can apply it to three joints that are hurting. Do not apply it to your back incision.    diphenoxylate-atropine 2.5-0.025 MG tablet Commonly known as: LOMOTIL Take 1 tablet by mouth 2 (two) times daily as needed for diarrhea or loose stools. What changed:  how much to take when to take this   donepezil 10 MG tablet Commonly known as: ARICEPT Take 1 tablet (10 mg total) by mouth daily.   doxycycline 100 MG tablet Commonly known as: VIBRA-TABS Take 1 tablet (100 mg total) by mouth every 12 (twelve) hours.   famotidine 20 MG tablet Commonly known as: PEPCID Take 1 tablet (20 mg total) by mouth 2 (two) times daily. Notes to patient: For reflux/stomach issues   fiber Pack packet Take 1 packet by mouth 2 (two) times daily. Notes to patient: To bulk stools/prevent loose stools.  It has been scheduled twice a day but you have been refusing it for the past 2 days--can adjust as needed. Purchase this over the counter if needed   fluvoxaMINE 100 MG tablet Commonly known as: LUVOX Take 1 tablet (100 mg total) by mouth at bedtime.   furosemide 20 MG tablet Commonly known as: LASIX Take 1 tablet (20 mg total) by mouth daily.   gabapentin 100 MG capsule Commonly known as: NEURONTIN Take 1 capsule (100 mg total) by mouth 3 (three) times daily. What changed:  how much to take when to take this additional instructions Notes to patient: This is for back pain that radiates to your legs. We  decreased it back down due to your request yesterday.    lamoTRIgine 200 MG tablet Commonly known as:  LAMICTAL Take 1 tablet (200 mg total) by mouth daily.   levofloxacin 750 MG tablet Commonly known as: LEVAQUIN Take 1 tablet (750 mg total) by mouth daily.   levothyroxine 88 MCG tablet Commonly known as: SYNTHROID Take 1 tablet (88 mcg total) by mouth daily before breakfast.   lidocaine 5 % Commonly known as: LIDODERM Place 2 patches onto the skin daily. Remove & Discard patch within 12 hours or as directed by MD Notes to patient: For back pain-- can place on area that hurts. It can be on for 12 hours then has to be off for 12 hours.    losartan 25 MG tablet Commonly known as: COZAAR Take 1 tablet (25 mg total) by mouth at bedtime. What changed:  medication strength how much to take   melatonin 5 MG Tabs Take 1 tablet (5 mg total) by mouth at bedtime as needed. What changed: how to take this   metFORMIN 500 MG tablet Commonly known as: GLUCOPHAGE Take 1 tablet (500 mg total) by mouth 2 (two) times daily with a meal. What changed: when to take this   multivitamin tablet Take 1 tablet by mouth daily.   ondansetron 4 MG tablet Commonly known as: ZOFRAN Take 1 tablet (4 mg total) by mouth every 8 (eight) hours as needed for refractory nausea / vomiting. What changed:  when to take this reasons to take this   oxyCODONE-acetaminophen 5-325 MG tablet--Rx# 60 pills Commonly known as: PERCOCET/ROXICET Take 0.5 tablets by mouth 4 (four) times daily -  with meals and at bedtime. Notes to patient: The goal is to get you off this shorting acting medication and use long acting narcotic only. YOU CAN TAKE AN ADDITIONAL WHOLE PILL AT BEDTIME--ONLY IF NEEDED FOR PAIN. DECREASE TO THREE TIMES A DAY AFTER 48 HOURS. AFTER A WEEK DECREASE TO TWICE A DAY,  THEN IN 7 DAYS, DECREASE TO ONCE A DAY AND SLOWLY WEAN TO AS NEEDED. YOU HAVE ENOUGH FOR A WHOLE MONTH IF YOU USE AS PRESCRIBED.    potassium chloride 10 MEQ tablet Commonly known as: KLOR-CON M Take 3 tablets (30 mEq total) by mouth 2  (two) times daily.   Santyl 250 UNIT/GM ointment Generic drug: collagenase Apply 1 Application topically daily. Apply 2-3 grams to affected area daily.   sodium chloride 1 g tablet Take 1 tablet (1 g total) by mouth 2 (two) times daily with a meal. What changed:  when to take this additional instructions Another medication with the same name was removed. Continue taking this medication, and follow the directions you see here.   tamsulosin 0.4 MG Caps capsule Commonly known as: FLOMAX Take 2 capsules (0.8 mg total) by mouth daily after supper. What changed: how much to take   traZODone 100 MG tablet Commonly known as: DESYREL Take 1 tablet (100 mg total) by mouth at bedtime.        Follow-up Information     [name], MD Follow up.   Specialty: Family Medicine Why: Call in 1-2 days for post hospital follow up and medical issues Contact information: [place] [address] [phone]         Judith Part, MD Follow up.   Specialty: Neurosurgery Why: Call in 1-2 days for post hospital follow up and for refills on pain medications Contact information: [place]  [place] [address] [phone]         Vira Agar, MD Follow up on 11/08/2022.   Specialty: Urology Why: apointment at 8 am Contact information: [place] [place] [address] [phone]         Karen Kays, NP Follow up on 10/26/2022.   Specialty: Nurse Practitioner Why: Be there at 8 am for 8:15 am appointment/voiding trial Contact information: [address] [place] [address] [phone]                 Signed: Bary Leriche 10/13/2022, 11:47 AM

## 2022-10-11 NOTE — Progress Notes (Signed)
Occupational Therapy Session Note  Patient Details  Name: [name] MRN: [id number] Date of Birth: 1955-10-15  Today's Date: 10/11/2022 OT Individual Time: [phone] OT Individual Time Calculation (min): 24 min    Short Term Goals: Week 6:  OT Short Term Goal 1 (Week 6): STG= LTG d/t ELOS  Skilled Therapeutic Interventions/Progress Updates:    Pt received supine with no c/o pain, agreeable to OT session. Discussed family education ongoing and pt reports of feeling overwhelmed with all family present this weekend. Recommended family continue coming in 1:1 to allow family edu to continue in less stimulating environment. She completed bed mobility with cueing for technique and (S) overall after increased time. She sat EOB and her back wound was unpacked. Notified RN, but [place] arrived. Included WOC in discussion re shower per pt request. Session now out of time, so pt left EOB with daughter and WOC present. Coordinated care with PT re later session to address shower transfer.   Therapy Documentation Precautions:  Precautions Precautions: Fall, Back Precaution Comments: foley Required Braces or Orthoses: Other Brace Other Brace: prevlon boots Restrictions Weight Bearing Restrictions: No Other Position/Activity Restrictions: external rotation R hip at baseline at all times, RLE shorter than LLE, pt ambulated household distances PTA, w/c for community  Therapy/Group: Individual Therapy  Curtis Sites 10/11/2022, 6:22 AM

## 2022-10-11 NOTE — Consult Note (Signed)
Urology Consult    Reason for consult: urinary retention  History of Present Illness: [name] is a 67 y.o. F admitted [facility]. She is s/p a back surgery complicated by wound and hardware infection requiring re-operation in late January. She had a protracted hospital course but was transferred to CIR earlier this month.   She has been having issues with urinary retention, failing multiple voiding trials while on the floor.   She has a history of a procedure for anterior compartment prolapse in the distant past. [name] has felt some pressure in her vagina recently and worries the prolapse may have recurred  Past Medical History:  Diagnosis Date   Anemia    Anxiety    Arthritis    Bipolar disorder ([name])    Depression    GERD (gastroesophageal reflux disease)    Heart murmur    "related to VSD"   High cholesterol    History of blood transfusion    "related to OR" (08/19/2016)   History of hiatal hernia    Hypertension    Hyperthyroidism    Mild cognitive impairment 09/06/2018   Paroxysmal ventricular tachycardia (HCC)    Type II diabetes mellitus ([place])    UTI (urinary tract infection)    being treated with Keflex   Ventricular septal defect     Past Surgical History:  Procedure Laterality Date   ABDOMINAL HYSTERECTOMY     BACK SURGERY     CARDIAC CATHETERIZATION N/A 06/23/2015   Procedure: Left Heart Cath and Coronary Angiography;  Surgeon: Marissa Dresser, MD;  Location: [facility];  Service: Cardiovascular;  Laterality: N/A;   CARDIAC CATHETERIZATION  1960   "VSD was so small; didn't need repaired"   EXAM UNDER ANESTHESIA WITH MANIPULATION OF HIP Right 06/02/2014   dr Rhona Raider   FRACTURE SURGERY     HERNIA REPAIR     HIP CLOSED REDUCTION Right 06/02/2014   Procedure: CLOSED MANIPULATION HIP;  Surgeon: Marissa Dibble, MD;  Location: [facility];  Service: Orthopedics;  Laterality: Right;   JOINT REPLACEMENT     JOINT REPLACEMENT     POSTERIOR LUMBAR  FUSION 4 LEVEL N/A 06/26/2022   Procedure: Lumbar One To Lumbar Five Posterior Instrumented Fusion;  Surgeon: Marissa Part, MD;  Location: [facility];  Service: Neurosurgery;  Laterality: N/A;   REFRACTIVE SURGERY Bilateral    SHOULDER ARTHROSCOPY Right    SHOULDER OPEN ROTATOR CUFF REPAIR Right    SPINAL FUSION  1996   "t10 down to my coccyx   SPINE HARDWARE REMOVAL     TOTAL ABDOMINAL HYSTERECTOMY     TOTAL HIP ARTHROPLASTY Right 05/10/2014   [facility]      by dr Harrell Gave [place]   TOTAL KNEE ARTHROPLASTY Left    TOTAL SHOULDER ARTHROPLASTY Left 08/19/2016   Procedure: TOTAL SHOULDER ARTHROPLASTY;  Surgeon: Tania Ade, MD;  Location: [facility];  Service: Orthopedics;  Laterality: Left;  Left total shoulder replacement     Current Hospital Medications:  Home meds:  No current facility-administered medications on file prior to encounter.   Current Outpatient Medications on File Prior to Encounter  Medication Sig Dispense Refill   losartan (COZAAR) 100 MG tablet Take 100 mg by mouth at bedtime.     QUEtiapine (SEROQUEL) 300 MG tablet Take 600 mg by mouth at bedtime.     rosuvastatin (CRESTOR) 20 MG tablet Take 20 mg by mouth daily.     traMADol (ULTRAM) 50 MG tablet Take 25  mg by mouth every 6 (six) hours as needed (pain).     acetaminophen (TYLENOL) 500 MG tablet Take 1,000 mg by mouth in the morning, at noon, and at bedtime. (0800, 1400 & 2000)     bethanechol (URECHOLINE) 10 MG tablet Place 1 tablet (10 mg total) into feeding tube 3 (three) times daily.     buprenorphine (BUTRANS) 5 MCG/HR PTWK Place 1 patch onto the skin once a week. 1 patch    carvedilol (COREG) 12.5 MG tablet Place 1 tablet (12.5 mg total) into feeding tube 2 (two) times daily with a meal.     ceFEPIme 2 g in sodium chloride 0.9 % 100 mL Inject 2 g into the vein every 8 (eight) hours.     cyclobenzaprine (FLEXERIL) 5 MG tablet Take 5 mg by mouth 3 (three) times daily. (0800, 1300 & 2000)      diphenoxylate-atropine (LOMOTIL) 2.5-0.025 MG tablet Take 1-2 tablets by mouth 4 (four) times daily as needed for diarrhea or loose stools. 30 tablet 0   donepezil (ARICEPT) 10 MG tablet Take 10 mg by mouth daily.     feeding supplement (ENSURE ENLIVE / ENSURE PLUS) LIQD Take 237 mLs by mouth 3 (three) times daily between meals. 237 mL 12   fluvoxaMINE (LUVOX) 100 MG tablet Take 100 mg by mouth at bedtime.     gabapentin (NEURONTIN) 100 MG capsule Take 200 mg by mouth in the morning, at noon, and at bedtime. (0800, 1400 & 2000)     lamoTRIgine (LAMICTAL) 200 MG tablet Take 200 mg by mouth daily.     levothyroxine (SYNTHROID) 88 MCG tablet Take 88 mcg by mouth daily before breakfast.     loperamide (IMODIUM) 2 MG capsule Take 2-4 mg by mouth See admin instructions. Take 1 capsule (2 mg) by mouth once daily scheduled & take 1-2 capsules (2-4 mg) by mouth once daily as needed for additional loose stools.     melatonin 5 MG TABS Place 1 tablet (5 mg total) into feeding tube at bedtime as needed.  0   metFORMIN (GLUCOPHAGE) 500 MG tablet Take 500 mg by mouth in the morning and at bedtime.     metroNIDAZOLE (FLAGYL) 500 MG/100ML Inject 100 mLs (500 mg total) into the vein every 8 (eight) hours.     Multiple Vitamin (MULTIVITAMIN WITH MINERALS) TABS tablet Place 1 tablet into feeding tube daily.     Multiple Vitamin (MULTIVITAMIN) tablet Take 1 tablet by mouth daily.       Nutritional Supplements (FEEDING SUPPLEMENT, GLUCERNA 1.5 CAL,) LIQD Place 1,000 mLs into feeding tube daily.     ondansetron (ZOFRAN) 4 MG tablet Take 1 tablet (4 mg total) by mouth every 6 (six) hours as needed for nausea or vomiting. 20 tablet 0   oxyCODONE 7.5 MG TABS Place 7.5 mg into feeding tube every 4 (four) hours as needed for moderate pain. 30 tablet 0   OZEMPIC, 2 MG/DOSE, 8 MG/3ML SOPN Inject 2 mg into the skin every Monday.     pantoprazole (PROTONIX) 40 MG tablet Take 80 mg by mouth at bedtime.     sodium chloride 1 g  tablet Take 1 g by mouth every other day. (0900)     sodium chloride 1 g tablet Place 1 tablet (1 g total) into feeding tube 2 (two) times daily with a meal.     tamsulosin (FLOMAX) 0.4 MG CAPS capsule Take 1 capsule (0.4 mg total) by mouth daily after supper. 30 capsule  traZODone (DESYREL) 100 MG tablet Take 100 mg by mouth at bedtime.     vancomycin (VANCOREADY) 1250 MG/250ML SOLN Inject 250 mLs (1,250 mg total) into the vein every 12 (twelve) hours.       Scheduled Meds:  bethanechol  5 mg Oral TID   buprenorphine  1 patch Transdermal Weekly   buPROPion  150 mg Oral Daily   carvedilol  25 mg Oral BID WC   cyclobenzaprine  10 mg Oral TID   diclofenac Sodium  2 g Topical QID   donepezil  10 mg Oral Daily   doxycycline  100 mg Oral Q12H   enoxaparin (LOVENOX) injection  40 mg Subcutaneous Q24H   famotidine  20 mg Oral BID   feeding supplement  237 mL Oral TID BM   fiber  1 packet Oral BID   fluvoxaMINE  100 mg Oral QHS   furosemide  20 mg Oral Daily   gabapentin  200 mg Oral TID   [name]'s butt cream   Topical Daily   lamoTRIgine  200 mg Oral Daily   levofloxacin  750 mg Oral Daily   levothyroxine  88 mcg Oral Q0600   lidocaine  2 patch Transdermal Q24H   losartan  25 mg Oral QHS   metFORMIN  500 mg Oral BID WC   multivitamin with minerals  1 tablet Oral Daily   oxyCODONE  2.5 mg Oral Q6H   potassium chloride  30 mEq Oral BID   prochlorperazine  10 mg Oral Q breakfast   saccharomyces boulardii  250 mg Oral BID   sodium chloride  1 g Oral BID WC   tamsulosin  0.8 mg Oral QPC supper   traZODone  100 mg Oral QHS   Continuous Infusions:  sodium chloride 10 mL/hr at 09/28/22 1813   sodium chloride 10 mL/hr at 09/28/22 1640   PRN Meds:.sodium chloride, sodium chloride, acetaminophen, alum & mag hydroxide-simeth, benzocaine, bisacodyl, butalbital-acetaminophen-caffeine, diphenhydrAMINE, diphenoxylate-atropine, guaiFENesin-dextromethorphan, ipratropium-albuterol, labetalol,  lidocaine, melatonin, menthol-cetylpyridinium, ondansetron, mouth rinse, oxyCODONE-acetaminophen, prochlorperazine **OR** prochlorperazine **OR** prochlorperazine, sodium chloride flush, sodium phosphate  Allergies: No Known Allergies  Family History  Problem Relation Age of Onset   Other Mother        alive   Stroke Father 67       deceased   Dementia Father    Chorea Maternal Grandfather    Dementia Maternal Aunt    Dementia Maternal Aunt    Heart attack Other        multiple uncles have died with myocardial infarction    Social History:  reports that she has quit smoking. Her smoking use included cigarettes. She has never used smokeless tobacco. She reports that she does not drink alcohol and does not use drugs.  ROS: A complete review of systems was performed.  All systems are negative except for pertinent findings as noted.  Physical Exam:  Vital signs in last 24 hours: Temp:  [97.7 F (36.5 C)-98 F (36.7 C)] 97.7 F (36.5 C) (03/25 1630) Pulse Rate:  [81-93] 93 (03/25 1630) Resp:  [16-20] 20 (03/25 1630) BP: (119-125)/(73-80) 120/73 (03/25 1630) SpO2:  [99 %-100 %] 100 % (03/25 1630) Weight:  [60.7 kg-65.3 kg] 60.7 kg (03/25 [id number]) Constitutional:  Alert and oriented, No acute distress Cardiovascular: Regular rate and rhythm Respiratory: Normal respiratory effort, Lungs clear bilaterally GI: Abdomen is soft, nontender, ND Neurologic: Grossly intact, no focal deficits Psychiatric: Normal mood and affect  Laboratory Data:  Recent Labs    10/11/22  0858  WBC 7.3  HGB 11.0*  HCT 33.2*  PLT 416*    Recent Labs    10/11/22 0858  NA 131*  K 4.1  CL 99  GLUCOSE 84  BUN 6*  CALCIUM 9.4  CREATININE 0.43*     Results for orders placed or performed during the hospital encounter of 09/03/22 (from the past 24 hour(s))  Basic metabolic panel     Status: Abnormal   Collection Time: 10/11/22  8:58 AM  Result Value Ref Range   Sodium 131 (L) 135 - 145 mmol/L    Potassium 4.1 3.5 - 5.1 mmol/L   Chloride 99 98 - 111 mmol/L   CO2 24 22 - 32 mmol/L   Glucose, Bld 84 70 - 99 mg/dL   BUN 6 (L) 8 - 23 mg/dL   Creatinine, Ser 0.43 (L) 0.44 - 1.00 mg/dL   Calcium 9.4 8.9 - 10.3 mg/dL   GFR, Estimated >60 >60 mL/min   Anion gap 8 5 - 15  CBC     Status: Abnormal   Collection Time: 10/11/22  8:58 AM  Result Value Ref Range   WBC 7.3 4.0 - 10.5 K/uL   RBC 3.60 (L) 3.87 - 5.11 MIL/uL   Hemoglobin 11.0 (L) 12.0 - 15.0 g/dL   HCT 33.2 (L) 36.0 - 46.0 %   MCV 92.2 80.0 - 100.0 fL   MCH 30.6 26.0 - 34.0 pg   MCHC 33.1 30.0 - 36.0 g/dL   RDW 16.0 (H) 11.5 - 15.5 %   Platelets 416 (H) 150 - 400 K/uL   nRBC 0.0 0.0 - 0.2 %   No results found for this or any previous visit (from the past 240 hour(s)).  Renal Function: Recent Labs    10/11/22 0858  CREATININE 0.43*   Estimated Creatinine Clearance: 57.1 mL/min (A) (by C-G formula based on SCr of 0.43 mg/dL (L)).  Radiologic Imaging: No results found.  I independently reviewed the above imaging studies.  Impression/Recommendation: 67 yo F with urinary retention following extensive spine operations and infection. I suspect her urinary retention is likely related to her spinal cord insults and debility. In the short term, we discussed intermittent self catheterization vs indwelling foley. Marissa Heath is interested in trying self cath and will work with the nursing staff in rehab to see if this would be feasible for her.   In the intermediate/long term, we discussed it would be reasonable to do urodynamics as an outpatient, after she has recovered more (probably ~ 6 months from her most recent back surgery). Marissa Lolli can follow up with urology after discharge and we can set up this study.   Otherwise, all questions answered form Marissa Devenny and her daughter. I will sign off and please call with any further issues.   Donald Pore 10/11/2022, 7:37 PM  [facility]  Pager: [phone]

## 2022-10-11 NOTE — Progress Notes (Signed)
Occupational Therapy Discharge Summary  Patient Details  Name: TAYGAN WHITTIKER MRN: [id number] Date of Birth: [birth year]  Date of Discharge from OT service:October 12, 2022  {CHL IP REHAB OT TIME CALCULATIONS:[id number]}   Patient has met 10 of 11 long term goals due to improved activity tolerance, improved balance, postural control, ability to compensate for deficits, functional use of  RIGHT upper and RIGHT lower extremity, improved attention, improved awareness, and improved coordination.  Patient to discharge at [facility] Assist level.  Patient's care partner is independent to provide the necessary physical and cognitive assistance at discharge.    Reasons goals not met: [name] still requires min A to don pants.   Recommendation:  Patient will benefit from ongoing skilled OT services in home health setting to continue to advance functional skills in the area of BADL, iADL, and Reduce care partner burden.  Equipment: No equipment provided  Reasons for discharge: treatment goals met and discharge from hospital  Patient/family agrees with progress made and goals achieved: Yes  OT Discharge Precautions/Restrictions  Precautions Precautions: Fall;Back Precaution Booklet Issued: No Restrictions Weight Bearing Restrictions: No General PT Missed Treatment Reason: Other (Comment) (meeting with home health agency) Vital Signs Therapy Vitals Temp: 97.6 F (36.4 C) Temp Source: Oral Pulse Rate: 93 Resp: 18 BP: 125/79 Patient Position (if appropriate): Lying Oxygen Therapy SpO2: 100 % O2 Device: Room Air Pain Pain Assessment Pain Score: 0-No pain Faces Pain Scale: No hurt ADL ADL Eating: Supervision/safety Where Assessed-Eating: Chair Grooming: Supervision/safety Where Assessed-Grooming: Sitting at sink Upper Body Bathing: Supervision/safety Where Assessed-Upper Body Bathing: Sitting at sink Lower Body Bathing: Contact guard Where Assessed-Lower Body Bathing:  Sitting at sink Upper Body Dressing: Supervision/safety Where Assessed-Upper Body Dressing: Edge of bed Lower Body Dressing: Minimal assistance Where Assessed-Lower Body Dressing: Edge of bed Toileting: Minimal assistance Where Assessed-Toileting: Toilet, Bedside Commode Toilet Transfer: Therapist, music Method: Arts development officer: Geophysical data processor: Curator Method: Radiographer, therapeutic: Shower seat with back ADL Comments: unable to complete on eval. Pt refused bathing as she was determined to watch a basketball game, can only use hospital gown at this time due to multiple lines, rectal tube. Vision Baseline Vision/History: 1 Wears glasses Patient Visual Report: No change from baseline Vision Assessment?: No apparent visual deficits Perception  Perception: Within Functional Limits Praxis Praxis: Impaired Praxis Impairment Details: Initiation Cognition   Sensation Sensation Light Touch: Appears Intact Hot/Cold: Appears Intact Proprioception: Appears Intact Coordination Gross Motor Movements are Fluid and Coordinated: No Fine Motor Movements are Fluid and Coordinated: Yes Coordination and Movement Description: Baseline severe scoliosis and leg length discrepency limits coordination grossly Motor  Motor Motor: Abnormal postural alignment and control;Other (comment) Motor - Discharge Observations: Baseline severe scoliosis and leg length discrepency Mobility  Bed Mobility Bed Mobility: Rolling Right;Rolling Left;Sit to Sidelying Right;Supine to Sit;Sit to Supine Rolling Right: Supervision/verbal cueing Rolling Left: Supervision/Verbal cueing Supine to Sit: Supervision/Verbal cueing Sit to Supine: Supervision/Verbal cueing Sit to Sidelying Right: Supervision/Verbal cueing Transfers Sit to Stand: Contact Guard/Touching assist Stand to Sit: Contact Guard/Touching assist   Trunk/Postural Assessment  Cervical Assessment Cervical Assessment: Within Functional Limits Thoracic Assessment Thoracic Assessment: Exceptions to [facility] (hx scoliosis) Lumbar Assessment Lumbar Assessment: Exceptions to [facility] (hx lumbar fusion with wound dehiscense) Postural Control Postural Control: Deficits on evaluation Righting Reactions: delayed  Balance Balance Balance Assessed: Yes Static Sitting Balance Static Sitting - Balance Support: Bilateral upper extremity supported;Feet supported Static Sitting -  Level of Assistance: 6: Modified independent (Device/Increase time) Dynamic Sitting Balance Dynamic Sitting - Balance Support: Bilateral upper extremity supported Dynamic Sitting - Level of Assistance: 5: Stand by assistance Static Standing Balance Static Standing - Balance Support: During functional activity;Bilateral upper extremity supported Static Standing - Level of Assistance: 5: Stand by assistance (CGA) Dynamic Standing Balance Dynamic Standing - Balance Support: During functional activity;Bilateral upper extremity supported Dynamic Standing - Level of Assistance: 4: Min assist Extremity/Trunk Assessment RUE Assessment RUE Assessment: Exceptions to [facility] General Strength Comments: 4-/5 overall LUE Assessment LUE Assessment: Exceptions to [facility] General Strength Comments: 3+/5 overall. Hx shoulder replacement with strength deficits   Curtis Sites 10/11/2022, 8:13 PM

## 2022-10-11 NOTE — Consult Note (Signed)
[facility] Nurse wound follow up Wound type: lumbar surgical wound dehiscence Measurement: 1.5 cm x 1.3 cm x 1.2 cm  wound bed has decreased in slough.  Packing with gauze was painful to patient to lie on.  We will change orders again to fill defect more comfortably.  She plans on showering once discharged home so I will switch her back to aquacel for antimicrobial protection. She remains on antibiotics.  Daughter is present and we discuss showering.  She has a hand held shower head.  She will shower with her dressing in place, avoid getting the area wet as much as possible, and change the dressing after her shower.  I perform dressing change and she makes a video of the procedure today.  I provide them with supplies and will order additional aquacel for discharge.  I discuss at length that as the wound changes in appearance, the orders will change and that different clinicians often have different opinions on wound care based on what they are seeing that day.  That the orders implemented have all been to promote moist wound healing to the wound.  The wound seems "larger" as the yellow fibrin has been essentially removed.  We now have a clean pink wound bed (aside from scant yellow fibrin to perimeter from 6 to 7 o'clock.  Wound bed:10% yellow fibrin 90% clean pink wound bed with new granulation tissue noted.  Photo placed in chart.  Drainage (amount, consistency, odor) moderate serosanguinous  no odor Periwound: suture line is intact except for the proximal end.   INSTRUCTIONS FOR WOUND CARE - RESOURCE TO PRINT AND USE AT HOME: Dressing procedure/placement/frequency: 1.  Cleanse wound to lumbar spine with NS and pat dry.  2.  Using Q Tip, apply EITHER  medihoney or santyl to yellow tissue.   3.  Cut a thin strip of Aquacel and pack into wound depth (tunnels up at 12 o'clock) 4.  Cover wound with silicone foam dressing to protect and pad bony prominence 5.  Change daily (ALWAYS CHANGE AFTER SHOWER ON SHOWER  DAYS) Will follow.  Will See again Wednesday with family (day of discharge) and I will observe wound care being performed.   Estrellita Ludwig MSN, RN, FNP-BC CWON Wound, Ostomy, Continence Nurse [facility] [phone] Pager [phone]

## 2022-10-11 NOTE — Progress Notes (Addendum)
PROGRESS NOTE   Subjective/Complaints:  Spoke with pt's daughter via phone, son is at bedside, pt in good spirits Daughter concerned that pt is feeling like "bladder has dropped"  , gives hx of some type of bladder suspension procedure >66yrs ago.  No protracted coughing spells, has been undergoing voiding trial with small voids but mainly significant retention, [place] is underestimating bladder volume , no dysuria or hematuria  Also family is concerned with transporting pt to a scheduled ID appt on 3/28 and request that ID reassess pt prior to d/c on 3/27  ROS: inability to void, denies bowel issues, No breathing problems  Objective:   No results found. Recent Labs    10/11/22 0858  WBC 7.3  HGB 11.0*  HCT 33.2*  PLT 416*        Recent Labs    10/11/22 0858  NA 131*  K 4.1  CL 99  CO2 24  GLUCOSE 84  BUN 6*  CREATININE 0.43*  CALCIUM 9.4      Intake/Output Summary (Last 24 hours) at 10/11/2022 1117 Last data filed at 10/11/2022 [id number] Gross per 24 hour  Intake 120 ml  Output 2662 ml  Net -2542 ml         Physical Exam: Vital Signs Blood pressure 119/77, pulse 81, temperature 97.7 F (36.5 C), temperature source Oral, resp. rate 18, height 5\' 1"  (1.549 m), weight 60.7 kg, SpO2 100 %.   General: No acute distress Mood and affect are appropriate Heart: Regular rate and rhythm no rubs murmurs or extra sounds Lungs: Clear to auscultation, breathing unlabored, no rales or wheezes Abdomen: Positive bowel sounds, soft nontender to palpation, nondistended Extremities: No clubbing, cyanosis, or edema Skin: No evidence of breakdown, no evidence of rash Neurologic: Cranial nerves II through XII intact, motor strength is 5/5 in bilateral deltoid, bicep, tricep, grip, 3/5 right and 4/5 left hip flexor, knee extensors, ankle dorsiflexor and plantar flexor Sensory exam normal sensation to light touch and  proprioception in bilateral lower extremities  Musculoskeletal: Full range of motion in all 4 extremities. No joint swelling    Assessment/Plan: 1. Functional deficits which require 3+ hours per day of interdisciplinary therapy in a comprehensive inpatient rehab setting. Physiatrist is providing close team supervision and 24 hour management of active medical problems listed below. Physiatrist and rehab team continue to assess barriers to discharge/monitor patient progress toward functional and medical goals  Care Tool:  Bathing  Bathing activity did not occur: Refused           Bathing assist       Upper Body Dressing/Undressing Upper body dressing   What is the patient wearing?: Hospital gown only    Upper body assist Assist Level: Total Assistance - Patient < 25%    Lower Body Dressing/Undressing Lower body dressing      What is the patient wearing?: Hospital gown only     Lower body assist Assist for lower body dressing: Total Assistance - Patient < 25%     Toileting Toileting Toileting Activity did not occur (Clothing management and hygiene only): N/A (no void or bm) (using catheter and rectal tube)  Toileting assist Assist for  toileting: Dependent - Patient 0%     Transfers Chair/bed transfer  Transfers assist  Chair/bed transfer activity did not occur: Safety/medical concerns  Chair/bed transfer assist level: Maximal Assistance - Patient 25 - 49%     Locomotion Ambulation   Ambulation assist   Ambulation activity did not occur: Safety/medical concerns          Walk 10 feet activity   Assist  Walk 10 feet activity did not occur: Safety/medical concerns        Walk 50 feet activity   Assist Walk 50 feet with 2 turns activity did not occur: Safety/medical concerns         Walk 150 feet activity   Assist Walk 150 feet activity did not occur: Safety/medical concerns         Walk 10 feet on uneven surface   activity   Assist Walk 10 feet on uneven surfaces activity did not occur: Safety/medical concerns         Wheelchair     Assist Is the patient using a wheelchair?: Yes Type of Wheelchair: Manual Wheelchair activity did not occur: Safety/medical concerns         Wheelchair 50 feet with 2 turns activity    Assist    Wheelchair 50 feet with 2 turns activity did not occur: Safety/medical concerns       Wheelchair 150 feet activity     Assist  Wheelchair 150 feet activity did not occur: Safety/medical concerns       Blood pressure 119/77, pulse 81, temperature 97.7 F (36.5 C), temperature source Oral, resp. rate 18, height 5\' 1"  (1.549 m), weight 60.7 kg, SpO2 100 %.  Medical Problem List and Plan: 1. Functional deficits secondary to osteomyelitis, infection of lumbar surgical site complicated by meningitis/ventriculitis.             -pt also with significant deconditioning             -patient may not yet shower -ELOS/Goals: extended to 3/27, pt agreeable.  Min/Mod A goals with PT, OT, SLP  -Continue CIR therapies including PT, OT, and SLP     2.  Antithrombotics: -DVT/anticoagulation:  Pharmaceutical: Lovenox 40mg  QD             -antiplatelet therapy: N/a  3. Pain Management: Butrans 16mcg/hr patch for pain control w/ Flexeril 5mg  TID>>10mg  TID, Tylenol PRN, Oxycodone 7.5mg  q4h PRN, Gabapentin 100mg  TID  -2/26 tylenol PRN for HA -2/28 Fioricet ordered PRN headache -09/17/22 will change PRN oxycodone 2.5mg  to percocet 5-325mg , consider increase gabapentin to help with pain and HA- make changes gradually due to hx of encephalopathy  -3/7 Voltaren gel for L shoulder pain -3/8   lidocaine for chest wall tenderness started -3/15- continue flexeril  10mg  tid -She's using oxycodone 5mg  3-4x daily, butrans ptch 8mcg/hr per week as well as gabapentin tid and tylenol and butalbital prn -10/02/22 pain overall managed but breakthrough pain happening more and not as  well managed, considered Tramadol but this would not be beneficial per pharmacy due to her butrans patch. Don't want to go up on Oxycodone if we can avoid it; pt agreeable to tracking pain over the weekend, and discussing with weekday team whether longer acting opioid would be more beneficial with shorter acting PRNs; if pain uncontrolled this weekend, may switch. Monitor for now.  -Ordered lidoderm patches x2 for low back to see if we can get relief that way -10/03/22 pain doing some better, will ask primary team  regarding pain regimen but for now she's doing alright.  -3/18 continue current regimen for now, asked nursing to replace butrans patch that has fallen off -10/09/22 pain controlled, curious as to plan for d/c-- who will rx meds, will she wean-- advised to speak with weekday providers.  -10/10/22 interested in weaning, will decrease oxycodone to 2.5mg  q6h scheduled, leave percocet 5-325mg  q4h PRN (she's using it about 3x/day currently), and increase gabapentin to 200mg  TID. Eventual goal is to wean entirely  4. Mood/Behavior/Sleep: LCSW to follow for evaluation and support.             --Trazodone 100mg  QHS and melatonin 5mg  QHS PRN for  insomnia/mood  -09/04/22 placed sign on door to minimize interruptions 11p-5a; changed CBG checks as below to minimize interruptions             -antipsychotic agents:  Luvox 100mg  QHS and Lamictal 200mg  QD  -Continue Aricept 10mg  QD  -Seen by neuropsych-appreciate assistance  -3/1 increase melatonin to 10mg  dose QHS PRN  -3/4 decrease melatonin back to 5mg  dose QHS PRN -3/11 melatonin 5mg  working reasonably well  -3/15 seems to be dealing with extended LOS a bit better today   -team to continue to provide ego support  -10/04/22 discussed with Dr. Lovette Cliche, psychiatry consult was placed regarding psychiatric medications, depression, anxiety -3/21 Pt with hx of bipolar Disorder, seen by psychiatry today, psych rec restart wellbutrin Xl 150mg  daily- order  placed  5. Neuropsych/cognition: This patient is not fully capable of making decisions on her own behalf. -reviewed cognitive recovery with patient this week. May take until this summer to plateau. Had some mild memory deficits PTA as well.   6. Skin/Wound Care: WOC to follow for wound VAC changes --MASD managed by fecal system-- flexiseal clogged 09/04/22 AM but nursing states it's now working; monitor--might be able to d/c soon? -2/21 WOC stopped VAC, start aquacel and dressing -see flexiseal discussion below, #17 -2/27 WOC following, slough pulled away revealing 2cm deep defect -2/29 Spoke with Dr. Zada Finders today about wound depth, continue local wound care- continue to monitor, continue abx per ID,  no plan for procedure at this time -3/4 Seen by [place]- continue aquacel, abd pad/tape, apprecaite assistance -3/7 WOC rec medihoney, fill wound with moistened gauza, distal portion of wound to be backed with silver hydrofiber, appreciate assistance -3/8 Dr. Zada Finders contacted about wound, feels like this is doing well overall, continue current wound care orders, appreciate assistance -3/14-15 changed dressing to collagenase to see if we can get better debridement of fibronecrotic tissue. Small area really needs a bit of debridement  -I contacted NS re: f/u wound next week -3/21 seen by [facility] today, appreciate note and assistance  7. Fluids/Electrolytes/Nutrition: Monitor I/O.              -monitor intake closely, continue vitamins -2/19 discontinue core track, Consult dietician -2/27 decrease BMP to every other day -3/14--change labs to weekly -10/01/22 PICC removed  8.Sepsis w/ osteomyelitis/meningitis/encephalitis: Continue Cefepime 2g q8h, Vanc 1250mg  q12h, and Flagyl 500mg  q8h for at least 6 weeks --Vanc+Cefepime complete. Metronidazole has been discontinued as of 09/16/22 (was primarily for PNA, no longer needed) -3/13 --reviewed repeat MRI of brain with resolution! Spoke with  NS -3/15 converting to levaquin 750mg  QD and doxycycline 100mg  BID today -f/u with Dr. [name] scheduled for 3/25--since she'll be here, he or partner can see while in rehab.  -3/19 we will contact Dr. Drucilla Schmidt to see if any alternative antibiotics are  available due to nausea -3/20 Dr [name] recommended continue doxycyline, advised pt to take medications with food to minimize nausea -3/21 Pt would like to know if ID wound like to see her inpatient since she has f/u day after discharge scheduled, Called ID office- Dr. [name] will be outpatient that week, Office to call and ask if they would prefer a different date for the ID visit, appreciate assistance -10/10/22 advised family to call office and see if upcoming appt could be postponed, likely would be fine since she'll be on the doxy/levaquin for a few weeks at least.  Family again requesting ID to eval pt prior to d/c, have placed order    9. H/o PVT:  Monitor BP/heart rate TID--on coreg 12.5mg  BID>>up to 25mg  BID as of 09/10/22, Labetalol 10mg  q2h PRN  -2/23  BP has been a little above goal, increase coreg dose to 25mg  BID  -09/25/22 BP/HR with fair control, continue to monitor -09/26/22 BP running high, restarted Losartan 25mg  QHS (previously on 100mg ) -3/23-24/24 BP/HR well controlled     10/11/2022    6:32 AM 10/11/2022    4:56 AM 10/11/2022    3:59 AM  Vitals with BMI  Weight 133 lbs 13 oz 143 lbs 15 oz   Systolic   123456  Diastolic   77  Pulse   81      10. Urinary retention: Has failed 4 voiding trails. Has had foley since 12/23             -maintain foley cath, foley mgt per protocol -- Continue Flomax 0.4mg  qPM. Will d/c urecholine.  -10/02/22 pt and daughter curious when TOV might occur; hoping to get her stronger and able to get to [facility] better before doing so; discussed that this will be a weekday team discussion, may end up going home with it -3/19 voiding trial today -3/20 Has required IC, continue voiding trial, increase flomax to  0.8mg ,  may need f/u with urology -3/22 Trial urocholine 5mg  TID, consider restart foley tomorrow -10/09/22 initially still not voiding much, but had a larger void after eval today; wants to hold off on foley for now, continue TOV but consider replacement of foley if still having difficulty overnight/this weekend Urinary retention withonly small volume voids, pt now c/o that bladder has dropped, family (daughter) asking for uro eval, spoke with Dr Cain Sieve on call who will eval but states that likely management will be inserted foley at discharge with a f/u OP appt with Uro  11. VDRF s/p trach: decannulated 09/15/22 -I ordered downsizing to CFS # 4 today--tolerated well -continue ATC w/ pulmonary hygiene -begin day time plugging of trach tomorrow. Decannulate soon. -Trial of capping with respiratory-  can trial 24 hour capping today -2/22 Leave trach uncapped tonight -2/28 plan for decannulation today, has had trach capped for >72hours -2/29 Lurline Idol was  removed, doesn't need bandaid anymore on trach site   12. T2DM: Monitor BS ac/hs and use SSI for elevated BS.               --continue liberalized diet for now. -3/15 no longer checking cbgs per pt/family request although am serum glucose was elevated yesterday-- - resumed metformin 500mg   bid  -consider changing to CM diet at some point -3/18 glucose stable 104   13. Hypokalemia: Recurrent likely due to diarrhea/intake.  -09/18/22 stable K+ 4.2 on 09/17/22, wants to change to BID dosing so switched Klor to 58mEq BID (still will have 27mEq total/day)  -  3/18 k+ stable at 4.1  14. Anemia of critical illness: Hgb 7.5-8.9 range -stable at 10.4---f/u 10/11/22    Latest Ref Rng & Units 10/11/2022    8:58 AM 10/04/2022    7:38 AM 09/27/2022    5:28 AM  CBC  WBC 4.0 - 10.5 K/uL 7.3  7.1  8.0   Hemoglobin 12.0 - 15.0 g/dL 11.0  10.4  9.9   Hematocrit 36.0 - 46.0 % 33.2  31.5  31.6   Platelets 150 - 400 K/uL 416  475  411      15.  Hyponatremia/hypochloremia:  -09/04/22 Na 129, Cl 95, both stable, continue salt tabs 1g BID -Cortisol testing done during hospitalization (13.8 on 08/30/22), was normal, but daughter and pt want to make sure this is followed up- defer to weekday team regarding ongoing ?Addison's testing -2/21 129, a little decreased, fluid restriction started -2/28 stable at 131 -09/21/22 Na stable at 130, continue to follow with  q48h BMPs- adjust to 8am labs -Fluid restrictions liberalized to 1551ml -3/11 Na stable at 130, monitor on weekly labs -3/15 will relax to 1800 cc -3/18 stable Na 129  16. Encephalopathy/Delirium: improved substantially  17. Loose stools likely d/t TF and abx.            -recent rectal tube, since dc'ed  -generally improved -09/30/22 LBM --mushy but formed -10/03/22 more formed BMs, LBM 10/02/22, continue monitoring -LBM  10/09/22, cont regimen  18. Hypothyroidism: continue synthroid 54mcg QD  19. Chronic combined CHF, BLE Swelling: recent 2D echo done 08/20/2022 showed apical hypokinesis, LVEF 40-45%, RV function was reduced. Intermittently diuresed during hospitalization, most recently given 40mg  IV lasix on 09/03/22 -09/04/22 noted to still have BLE swelling, start 20mg  PO lasix and monitor lytes/BP/swelling -3/5 wt a little up, monitor tend -3/6 asked nutrition to stop milk/carnation- she wasn't drinking but it was counting toward her fluid restriction -3/7 wt stable, fluid restrictions liberalized to 1541ml -09/25/22 wt stable, monitor -10/01/22 weight stable to decreased. Not sure it's real although po intake has been sporadic -10/02/22 wt uptrending but stable. Monitor  -3/23-24/24 no signs of fluid overload, wt stabilizing, monitor Filed Weights   10/10/22 0531 10/11/22 0456 10/11/22 0632  Weight: 64.9 kg 65.3 kg 60.7 kg    20. Oral ulcer  --2/28 Orajel for oral ulcer discomfort  21.  Dry mucous membranes, may be related to anticholinergic meds cyclobenzaprine and trazodone,  pain and sleep are   better so reluctant to stop at this time  22. Nausea: somewhat chronic, takes meds on empty stomach in the morning, compazine given and helped, continue PRN compazine 5-10mg , encouraged pt to have some food before meds  -3/9 Continue compazine,  advised to take meds with food in AM -10/09/22 still struggling with having AM nausea d/t inadequate intake for meds-- will order Compazine 10mg  scheduled in the mornings with breakfast.  -10/10/22 nausea significantly improved, cont scheduled AM compazine and PO intake with AM meds.    LOS: 38 days A FACE TO [place] [address] 10/11/2022, 11:17 AM

## 2022-10-12 ENCOUNTER — Other Ambulatory Visit (HOSPITAL_COMMUNITY): Payer: Self-pay

## 2022-10-12 DIAGNOSIS — D638 Anemia in other chronic diseases classified elsewhere: Secondary | ICD-10-CM

## 2022-10-12 LAB — SEDIMENTATION RATE: Sed Rate: 64 mm/hr — ABNORMAL HIGH (ref 0–22)

## 2022-10-12 LAB — C-REACTIVE PROTEIN: CRP: 2.7 mg/dL — ABNORMAL HIGH (ref <1.0)

## 2022-10-12 MED ORDER — DIPHENOXYLATE-ATROPINE 2.5-0.025 MG PO TABS
1.0000 | ORAL_TABLET | Freq: Two times a day (BID) | ORAL | 0 refills | Status: AC | PRN
  Filled 2022-10-12: qty 30, 15d supply, fill #0

## 2022-10-12 MED ORDER — OXYCODONE-ACETAMINOPHEN 5-325 MG PO TABS
1.0000 | ORAL_TABLET | Freq: Three times a day (TID) | ORAL | Status: DC
  Administered 2022-10-12: 1 via ORAL
  Filled 2022-10-12: qty 1

## 2022-10-12 MED ORDER — LOSARTAN POTASSIUM 25 MG PO TABS
25.0000 mg | ORAL_TABLET | Freq: Every day | ORAL | 0 refills | Status: DC
  Filled 2022-10-12: qty 30, 30d supply, fill #0

## 2022-10-12 MED ORDER — FAMOTIDINE 20 MG PO TABS
20.0000 mg | ORAL_TABLET | Freq: Two times a day (BID) | ORAL | 0 refills | Status: AC
  Filled 2022-10-12: qty 60, 30d supply, fill #0

## 2022-10-12 MED ORDER — OXYCODONE-ACETAMINOPHEN 5-325 MG PO TABS
0.5000 | ORAL_TABLET | Freq: Three times a day (TID) | ORAL | Status: DC
  Administered 2022-10-12 – 2022-10-13 (×3): 0.5 via ORAL
  Filled 2022-10-12 (×3): qty 1

## 2022-10-12 MED ORDER — GABAPENTIN 100 MG PO CAPS
200.0000 mg | ORAL_CAPSULE | Freq: Three times a day (TID) | ORAL | 0 refills | Status: DC
  Filled 2022-10-12: qty 180, 30d supply, fill #0

## 2022-10-12 MED ORDER — LEVOFLOXACIN 750 MG PO TABS
750.0000 mg | ORAL_TABLET | Freq: Every day | ORAL | 0 refills | Status: DC
  Filled 2022-10-12: qty 30, 30d supply, fill #0

## 2022-10-12 MED ORDER — CYCLOBENZAPRINE HCL 10 MG PO TABS
10.0000 mg | ORAL_TABLET | Freq: Three times a day (TID) | ORAL | 0 refills | Status: DC
  Filled 2022-10-12: qty 90, 30d supply, fill #0

## 2022-10-12 MED ORDER — GABAPENTIN 100 MG PO CAPS
100.0000 mg | ORAL_CAPSULE | Freq: Three times a day (TID) | ORAL | 0 refills | Status: AC
  Filled 2022-10-12 – 2022-10-13 (×2): qty 90, 30d supply, fill #0

## 2022-10-12 MED ORDER — ACIDOPHILUS 100 MG PO CAPS
250.0000 mg | ORAL_CAPSULE | Freq: Two times a day (BID) | ORAL | 0 refills | Status: AC
  Filled 2022-10-12: qty 60, 30d supply, fill #0

## 2022-10-12 MED ORDER — POTASSIUM CHLORIDE CRYS ER 10 MEQ PO TBCR
30.0000 meq | EXTENDED_RELEASE_TABLET | Freq: Two times a day (BID) | ORAL | 0 refills | Status: DC
  Filled 2022-10-12: qty 120, 20d supply, fill #0

## 2022-10-12 MED ORDER — MELATONIN 5 MG PO TABS
5.0000 mg | ORAL_TABLET | Freq: Every evening | ORAL | 0 refills | Status: DC | PRN
  Filled 2022-10-12: qty 30, 30d supply, fill #0

## 2022-10-12 MED ORDER — TRAZODONE HCL 100 MG PO TABS
100.0000 mg | ORAL_TABLET | Freq: Every day | ORAL | 0 refills | Status: DC
  Filled 2022-10-12: qty 30, 30d supply, fill #0

## 2022-10-12 MED ORDER — CARVEDILOL 25 MG PO TABS
25.0000 mg | ORAL_TABLET | Freq: Two times a day (BID) | ORAL | 0 refills | Status: AC
  Filled 2022-10-12: qty 60, 30d supply, fill #0

## 2022-10-12 MED ORDER — METFORMIN HCL 500 MG PO TABS
500.0000 mg | ORAL_TABLET | Freq: Two times a day (BID) | ORAL | 0 refills | Status: AC
  Filled 2022-10-12: qty 60, 30d supply, fill #0

## 2022-10-12 MED ORDER — DICLOFENAC SODIUM 1 % EX GEL
2.0000 g | Freq: Four times a day (QID) | CUTANEOUS | 0 refills | Status: DC
  Filled 2022-10-12: qty 100, 30d supply, fill #0

## 2022-10-12 MED ORDER — ONDANSETRON HCL 4 MG PO TABS
4.0000 mg | ORAL_TABLET | Freq: Three times a day (TID) | ORAL | 0 refills | Status: AC | PRN
  Filled 2022-10-12: qty 20, 7d supply, fill #0

## 2022-10-12 MED ORDER — TAMSULOSIN HCL 0.4 MG PO CAPS
0.8000 mg | ORAL_CAPSULE | Freq: Every day | ORAL | 0 refills | Status: DC
  Filled 2022-10-12: qty 60, 30d supply, fill #0

## 2022-10-12 MED ORDER — DOXYCYCLINE HYCLATE 100 MG PO TABS
100.0000 mg | ORAL_TABLET | Freq: Two times a day (BID) | ORAL | 0 refills | Status: DC
  Filled 2022-10-12: qty 60, 30d supply, fill #0

## 2022-10-12 MED ORDER — SODIUM CHLORIDE 1 G PO TABS
1.0000 g | ORAL_TABLET | Freq: Two times a day (BID) | ORAL | 0 refills | Status: AC
  Filled 2022-10-12: qty 60, 30d supply, fill #0

## 2022-10-12 MED ORDER — ACETAMINOPHEN 325 MG PO TABS: 325.0000 mg | ORAL_TABLET | Freq: Four times a day (QID) | ORAL | Status: AC | PRN

## 2022-10-12 MED ORDER — LIDOCAINE 5 % EX PTCH
2.0000 | MEDICATED_PATCH | CUTANEOUS | 0 refills | Status: DC
  Filled 2022-10-12: qty 60, 30d supply, fill #0

## 2022-10-12 MED ORDER — FUROSEMIDE 20 MG PO TABS
20.0000 mg | ORAL_TABLET | Freq: Every day | ORAL | 0 refills | Status: DC
  Filled 2022-10-12: qty 30, 30d supply, fill #0

## 2022-10-12 MED ORDER — OXYCODONE-ACETAMINOPHEN 5-325 MG PO TABS
0.5000 | ORAL_TABLET | Freq: Three times a day (TID) | ORAL | 0 refills | Status: DC
  Filled 2022-10-12: qty 60, 30d supply, fill #0

## 2022-10-12 MED ORDER — GABAPENTIN 100 MG PO CAPS
100.0000 mg | ORAL_CAPSULE | Freq: Three times a day (TID) | ORAL | Status: DC
  Administered 2022-10-12 – 2022-10-13 (×2): 100 mg via ORAL
  Filled 2022-10-12 (×2): qty 1

## 2022-10-12 MED ORDER — BUPROPION HCL ER (XL) 150 MG PO TB24
150.0000 mg | ORAL_TABLET | Freq: Every day | ORAL | 0 refills | Status: DC
  Filled 2022-10-12: qty 30, 30d supply, fill #0

## 2022-10-12 NOTE — Plan of Care (Signed)
  Problem: RH Memory Goal: LTG Patient will use memory compensatory aids to (SLP) Description: LTG:  Patient will use memory compensatory aids to recall biographical/new, daily complex information with cues (SLP) Outcome: Completed/Met   Problem: RH Attention Goal: LTG Patient will demonstrate this level of attention during functional activites (SLP) Description: LTG:  Patient will will demonstrate this level of attention during functional activites (SLP) Outcome: Completed/Met   Problem: RH Awareness Goal: LTG: Patient will demonstrate awareness during functional activites type of (SLP) Description: LTG: Patient will demonstrate awareness during functional activites type of (SLP) Outcome: Completed/Met   Problem: RH Problem Solving Goal: LTG Patient will demonstrate problem solving for (SLP) Description: LTG:  Patient will demonstrate problem solving for basic/complex daily situations with cues  (SLP) Outcome: Completed/Met

## 2022-10-12 NOTE — Progress Notes (Signed)
Physical Therapy Discharge Summary  Patient Details  Name: [name] MRN: [id number] Date of Birth: June 03, 1956  Date of Discharge from PT service:October 12, 2022  Today's Date: 10/12/2022 PT Individual Time: 1300-1345 and 1531-1630 PT Individual Time Calculation (min): 45 min and 59 min   Patient has met 7 of 8 long term goals due to improved activity tolerance, improved balance, improved postural control, increased strength, improved attention, improved awareness, and improved coordination.  Patient to discharge at an ambulatory level [place].   Patient's care partner is independent to provide the necessary physical and cognitive assistance at discharge.  Reasons goals not met: Pt does not yet have endurance to ambulate 65' consecutively, but is able to ambulate x42' with RW and CGA.  Recommendation:  Patient will benefit from ongoing skilled PT services in home health setting to continue to advance safe functional mobility, address ongoing impairments in strength, balance, endurance, ambulation, and minimize fall risk.  Equipment: No equipment provided  Reasons for discharge: discharge from hospital  Patient/family agrees with progress made and goals achieved: Yes  Skilled Therapeutic Interventions: Pt received supine in bed and agrees to therapy. Reports pain in back and L shoulder. Number not provided. PT provides rest breaks and repositioning as needed to manage pain. Pt performs supine to sit slowly with cues for body mechanics, sequencing, and positioning. Pt performs sit to stand and stand step transfer to [facility] with RW and cues for sequencing. WC transport outside for car transfer. Pt attempts car transfer with daughter's Lexus SUV. Pt has difficulty clearing seat with buttocks and requires modA to complete (pt will be going home in much lower car). Pt perseverates on feeling weak, and requires modA to transfer back to [facility]. WC transport back to room. Stand pivot to bed with  modA and no AD. Pt left supine with all needs within reach.   2nd Session: Pt received supine in bed and agrees to therapy. No complaint of pain. Supine to sit with cues for sequencing and improved efficiency from previous session. Pt performs sit to stand from bed and stand step to [facility] with RW and close supervision, with cues for hand placement and positioning. WC transport to gym for time management. Pt performs sit to stand from [facility] with cues fo rhand placement and sequencing, then ambulates x42' with CGA and cues for posture and step sequencing. Pt requires extended seated rest break. Pt then completes x4 3" steps with bilateral hand rails and CGA/minA, with cues for step sequencing and body position. Pt propels WC x30' with bilateral upper extremities and cues for propulsion technique. Pt complains of L shoulder pain so activity terminated. WC transport back to room. Stand pivot back to bed with modA and cues for sequencing. ModA for sit to supine with management of bilateral lower extremities. Pt left supine with all needs within reach.   PT Discharge Precautions/Restrictions Precautions Precautions: Fall;Back Restrictions Weight Bearing Restrictions: No Pain Interference Pain Interference Pain Effect on Sleep: 3. Frequently Pain Interference with Therapy Activities: 3. Frequently Pain Interference with Day-to-Day Activities: 3. Frequently Vision/Perception  Vision - History Ability to See in Adequate Light: 0 Adequate Perception Perception: Within Functional Limits Praxis Praxis: Impaired Praxis Impairment Details: Initiation  Cognition Overall Cognitive Status: History of cognitive impairments - at baseline Arousal/Alertness: Awake/alert Orientation Level: Oriented X4 Memory: Impaired Safety/Judgment: Appears intact Sensation Sensation Light Touch: Appears Intact Coordination Gross Motor Movements are Fluid and Coordinated: No Coordination and Movement Description: Baseline  severe scoliosis and  leg length discrepency limits coordination grossly Motor  Motor Motor: Abnormal postural alignment and control;Other (comment) Motor - Discharge Observations: Baseline severe scoliosis and leg length discrepency  Mobility Bed Mobility Supine to Sit: Supervision/Verbal cueing Sit to Supine: Supervision/Verbal cueing Transfers Transfers: Sit to Stand;Stand to Sit;Stand Pivot Transfers Sit to Stand: Contact Guard/Touching assist Stand to Sit: Contact Guard/Touching assist Stand Pivot Transfers: Contact Guard/Touching assist Transfer (Assistive device): Rolling walker Locomotion  Gait Ambulation: Yes Gait Assistance: Contact Guard/Touching assist Gait Distance (Feet): 42 Feet Assistive device: Rolling walker Gait Assistance Details: Verbal cues for sequencing;Verbal cues for gait pattern;Verbal cues for safe use of DME/AE Gait Gait: Yes Gait Pattern: Impaired Gait Pattern:  (severe leg length discrepency, L knee remains flexed and L hip externally rotated throughout gait cycle) Stairs / Additional Locomotion Stairs: Yes Stairs Assistance: Minimal Assistance - Patient > 75% Stair Management Technique: Two rails Number of Stairs: 4 Height of Stairs: 3 Curb: Minimal Assistance - Patient >75% Pick up small object from the floor (from standing position) activity did not occur: Safety/medical concerns Product manager Mobility: Yes Wheelchair Assistance: Chartered loss adjuster: Both upper extremities Wheelchair Parts Management: Needs assistance Distance: 30'  Trunk/Postural Assessment  Cervical Assessment Cervical Assessment: Within Functional Limits Thoracic Assessment Thoracic Assessment: Exceptions to [facility] (scoliosis) Lumbar Assessment Lumbar Assessment:  (lumbar fusion with wound dehiscence) Postural Control Postural Control: Deficits on evaluation Righting Reactions: delayed  Balance Balance Balance Assessed:  Yes Static Sitting Balance Static Sitting - Balance Support: Bilateral upper extremity supported;Feet supported Static Sitting - Level of Assistance: 6: Modified independent (Device/Increase time) Dynamic Sitting Balance Dynamic Sitting - Balance Support: Bilateral upper extremity supported Dynamic Sitting - Level of Assistance: 5: Stand by assistance Static Standing Balance Static Standing - Balance Support: During functional activity;Bilateral upper extremity supported Static Standing - Level of Assistance: 5: Stand by assistance Dynamic Standing Balance Dynamic Standing - Balance Support: During functional activity;Bilateral upper extremity supported Dynamic Standing - Level of Assistance: 5: Stand by assistance (CGA) Extremity Assessment  RLE Assessment RLE Assessment: Exceptions to [facility] General Strength Comments: grossly 3+/5 LLE Assessment LLE Assessment: Exceptions to [facility] General Strength Comments: grossly 3/5   Breck Coons, PT, DPT 10/12/2022, 5:00 PM

## 2022-10-12 NOTE — Progress Notes (Signed)
Inpatient Rehabilitation Care Coordinator Discharge Note   Patient Details  Name: RILDA NORWAY MRN: [id number] Date of Birth: [birth year]   Discharge location: Home with husband and between family and private duty will have 24/7 care at home  Length of Stay: 40 days  Discharge activity level: CGA-min assist level  Home/community participation: active  Patient response [id number] Literacy - How often do you need to have someone help you when you read instructions, pamphlets, or other written material from your doctor or pharmacy?: Never  Patient response [id number] Isolation - How often do you feel lonely or isolated from those around you?: Never  Services provided included: MD, RD, PT, OT, SLP, RN, CM, TR, Pharmacy, Neuropsych, SW  Financial Services:  Charity fundraiser Utilized: Private Insurance [employer]  Choices offered to/list presented to: Pt and husband  Follow-up services arranged:  [facility], Patient/Family has no preference for HH/DME agencies [facility] ot sp rn aide  Has all needed equipment from past admissions         Patient response to transportation need: Is the patient able to respond to transportation needs?: Yes In the past 12 months, has lack of transportation kept you from medical appointments or from getting medications?: No In the past 12 months, has lack of transportation kept you from meetings, work, or from getting things needed for daily living?: No    Comments (or additional information):Family was here daily with pt and participated in her care. Aware of her need for 24/7 care and has hired assist for when they are not available. Will go to Urology office to do voiding trial and go home with a foley from CIR.   Patient/Family verbalized understanding of follow-up arrangements:  Yes  Individual responsible for coordination of the follow-up plan: Copper-husband  [phone]  Confirmed correct  DME delivered: [name] 10/12/2022    Tivon Lemoine, Gardiner Rhyme

## 2022-10-12 NOTE — Progress Notes (Signed)
Patient's lab will be drawn again as phlebotomy draw was unsuccessful.Patient refused in and out cath this morning, bladder scanned for 292cc. Per patient she will want to wait for now. Will let day shift know.

## 2022-10-12 NOTE — Progress Notes (Addendum)
Patient ID: [name], female   DOB: 1955/12/01, 67 y.o.   MRN: [id number] Patient educated and trained on I+O catheterization with good return demonstration for placement. Had difficulty advancing the catheter once placed and needed an extra hand for stabilization of the drain container. Reported soreness of labia/peri area and preference to go home with a foley for ~2 weeks and then try a voiding trial again.  Noted she had spoken with the the urologist at length yesterday and PM+R today regarding options and given limited family support, this would be the best option. I+O cath supplies and written instructions left with the patient for discharge should the supplies be needed with the next voiding trial. Reviewed foley care and maintenance with the patient for discharge. Margarito Liner

## 2022-10-12 NOTE — Progress Notes (Signed)
Speech Language Pathology Discharge Summary  Patient Details  Name: [name] MRN: [id number] Date of Birth: 06/03/1956  Date of Discharge from [facility], 2024  Today's Date: 10/12/2022 SLP Individual Time: 1130-1200 SLP Individual Time Calculation (min): 30 min   Skilled Therapeutic Interventions:  Pt seen for skilled SLP session to address functional cognitive-linguistic goals with continuation of medication management task.  Pt completed medication chart with min assistance. Discussed SLP PoC and recommendation for continued SLP intervention in next setting if needed per her and her family. Pt verbalized good understanding. Pt left in bed with bed alarm activated and call bell in reach.     Patient has met 4 of 4 long term goals.  Patient to discharge at [facility] level.  Reasons goals not met:   all goals met  Clinical Impression/Discharge Summary: Patient has demonstrated functional gains toward functional cognitive goals meeting 4 of 4 long-term goals this admission.  Pt is currently completing functional cognitive tasks at the min assistance level with min A verbal cues to self correct errors, and re-direct attention to task. Pt was able to tolerate a regular diet and thin liquids with no clinical s/sx of aspiration. Pt tolerated PMV placement and eventual decannulation during IPR stay. Patient and family education is complete and patient to discharge at overall min assistance level for cognition. Patient's care partner is independent to provide the necessary physical and cognitive-communication assistance at discharge. Patient would benefit from continued SLP services in the home health setting to maximize communication skills and functional independence and reduce caregiver burden. It has been a pleasure to work with Mrs. Cabbagestalk and her family.   Care Partner:  Caregiver Able to Provide Assistance: Yes  Type of Caregiver Assistance:  Physical;Cognitive  Recommendation:  Home Health SLP;24 hour supervision/assistance  Rationale for SLP Follow Up: Maximize cognitive function and independence;Reduce caregiver burden   Equipment: n/a   Reasons for discharge: Discharged from hospital;Treatment goals met   Patient/Family Agrees with Progress Made and Goals Achieved: Yes    Wyn Forster 10/12/2022, 8:45 AM

## 2022-10-12 NOTE — Progress Notes (Signed)
PROGRESS NOTE   Subjective/Complaints:  Pt was seen by urology yesterday, recommended trying IC, urodynamic study as outpatient. PT declined IC with nursing this AM. She says she does not want family to complete IC and feels that IC may be too much for her at this time. Also remembering to cath  on time may be difficult. Will plan to restart Foley at night today, she would like to practice IC a few more times in case she needs to do it in the future.  Pain in back poorly controlled since oxycodone dose was decreased.   ROS: No CP or SOB, no N/V, abdominal pain + back pain  Objective:   No results found. Recent Labs    10/11/22 0858  WBC 7.3  HGB 11.0*  HCT 33.2*  PLT 416*         Recent Labs    10/11/22 0858  NA 131*  K 4.1  CL 99  CO2 24  GLUCOSE 84  BUN 6*  CREATININE 0.43*  CALCIUM 9.4       Intake/Output Summary (Last 24 hours) at 10/12/2022 [id number] Last data filed at 10/12/2022 0800 Gross per 24 hour  Intake 360 ml  Output 1750 ml  Net -1390 ml         Physical Exam: Vital Signs Blood pressure 106/72, pulse 84, temperature 97.9 F (36.6 C), temperature source Oral, resp. rate 18, height 5\' 1"  (1.549 m), weight 63.8 kg, SpO2 97 %.   Constitutional:  thin, NAD HENT: [place]/AT,  MMM   Eyes: Conjugate gaze conjunctiva clear, PERRL Neck: Trach site well healed Cardiovascular: RRR, soft murmur heard (VSD) Pulmonary: CTAB no w/r/r, no increased WOB Abdominal: Soft, nontender, nondistended, positive bowel sounds/normal active Genitourinary:    Comments:Sacral wound dressing not reassessed today MsK:no joint swelling noted Ext: no clubbing, cyanosis, or edema Neuro: CN 2-12 grossly intact, memory deficits, alert and awake,motor strength is 5/5 in bilateral deltoid, bicep, tricep, grip, 3/5 right and 4/5 left hip flexor, knee extensors, ankle dorsiflexor and plantar flexor  Psych: pleasant,  appropriate         Assessment/Plan: 1. Functional deficits which require 3+ hours per day of interdisciplinary therapy in a comprehensive inpatient rehab setting. Physiatrist is providing close team supervision and 24 hour management of active medical problems listed below. Physiatrist and rehab team continue to assess barriers to discharge/monitor patient progress toward functional and medical goals  Care Tool:  Bathing  Bathing activity did not occur: Refused           Bathing assist       Upper Body Dressing/Undressing Upper body dressing   What is the patient wearing?: Hospital gown only    Upper body assist Assist Level: Total Assistance - Patient < 25%    Lower Body Dressing/Undressing Lower body dressing      What is the patient wearing?: Hospital gown only     Lower body assist Assist for lower body dressing: Total Assistance - Patient < 25%     Toileting Toileting Toileting Activity did not occur (Clothing management and hygiene only): N/A (no void or bm) (using catheter and rectal tube)  Toileting assist Assist for  toileting: Dependent - Patient 0%     Transfers Chair/bed transfer  Transfers assist  Chair/bed transfer activity did not occur: Safety/medical concerns  Chair/bed transfer assist level: Maximal Assistance - Patient 25 - 49%     Locomotion Ambulation   Ambulation assist   Ambulation activity did not occur: Safety/medical concerns          Walk 10 feet activity   Assist  Walk 10 feet activity did not occur: Safety/medical concerns        Walk 50 feet activity   Assist Walk 50 feet with 2 turns activity did not occur: Safety/medical concerns         Walk 150 feet activity   Assist Walk 150 feet activity did not occur: Safety/medical concerns         Walk 10 feet on uneven surface  activity   Assist Walk 10 feet on uneven surfaces activity did not occur: Safety/medical concerns          Wheelchair     Assist Is the patient using a wheelchair?: Yes Type of Wheelchair: Manual Wheelchair activity did not occur: Safety/medical concerns         Wheelchair 50 feet with 2 turns activity    Assist    Wheelchair 50 feet with 2 turns activity did not occur: Safety/medical concerns       Wheelchair 150 feet activity     Assist  Wheelchair 150 feet activity did not occur: Safety/medical concerns       Blood pressure 106/72, pulse 84, temperature 97.9 F (36.6 C), temperature source Oral, resp. rate 18, height 5\' 1"  (1.549 m), weight 63.8 kg, SpO2 97 %.  Medical Problem List and Plan: 1. Functional deficits secondary to osteomyelitis, infection of lumbar surgical site complicated by meningitis/ventriculitis.             -pt also with significant deconditioning             -patient may not yet shower -ELOS/Goals: extended to 3/27, pt agreeable.  Min/Mod A goals with PT, OT, SLP  -Continue CIR therapies including PT, OT, and SLP   -DC  home tomorrow     2.  Antithrombotics: -DVT/anticoagulation:  Pharmaceutical: Lovenox 40mg  QD             -antiplatelet therapy: N/a  3. Pain Management: Butrans 49mcg/hr patch for pain control w/ Flexeril 5mg  TID>>10mg  TID, Tylenol PRN, Oxycodone 7.5mg  q4h PRN, Gabapentin 100mg  TID  -2/26 tylenol PRN for HA -2/28 Fioricet ordered PRN headache -09/17/22 will change PRN oxycodone 2.5mg  to percocet 5-325mg , consider increase gabapentin to help with pain and HA- make changes gradually due to hx of encephalopathy  -3/7 Voltaren gel for L shoulder pain -3/8   lidocaine for chest wall tenderness started -3/15- continue flexeril  10mg  tid -She's using oxycodone 5mg  3-4x daily, butrans ptch 70mcg/hr per week as well as gabapentin tid and tylenol and butalbital prn -10/02/22 pain overall managed but breakthrough pain happening more and not as well managed, considered Tramadol but this would not be beneficial per pharmacy due to her  butrans patch. Don't want to go up on Oxycodone if we can avoid it; pt agreeable to tracking pain over the weekend, and discussing with weekday team whether longer acting opioid would be more beneficial with shorter acting PRNs; if pain uncontrolled this weekend, may switch. Monitor for now.  -Ordered lidoderm patches x2 for low back to see if we can get relief that way -10/03/22 pain doing  some better, will ask primary team regarding pain regimen but for now she's doing alright.  -3/18 continue current regimen for now, asked nursing to replace butrans patch that has fallen off -10/09/22 pain controlled, curious as to plan for d/c-- who will rx meds, will she wean-- advised to speak with weekday providers.  -10/10/22 interested in weaning, will decrease oxycodone to 2.5mg  q6h scheduled, leave percocet 5-325mg  q4h PRN (she's using it about 3x/day currently), and increase gabapentin to 200mg  TID. Eventual goal is to wean entirely -10/12/22 increase  oxycodone back to 5mg  four times daily   4. Mood/Behavior/Sleep: LCSW to follow for evaluation and support.             --Trazodone 100mg  QHS and melatonin 5mg  QHS PRN for  insomnia/mood  -09/04/22 placed sign on door to minimize interruptions 11p-5a; changed CBG checks as below to minimize interruptions             -antipsychotic agents:  Luvox 100mg  QHS and Lamictal 200mg  QD  -Continue Aricept 10mg  QD  -Seen by neuropsych-appreciate assistance  -3/1 increase melatonin to 10mg  dose QHS PRN  -3/4 decrease melatonin back to 5mg  dose QHS PRN -3/11 melatonin 5mg  working reasonably well  -3/15 seems to be dealing with extended LOS a bit better today   -team to continue to provide ego support  -10/04/22 discussed with Dr. Lovette Cliche, psychiatry consult was placed regarding psychiatric medications, depression, anxiety -3/21 Pt with hx of bipolar Disorder, seen by psychiatry today, psych rec restart wellbutrin Xl 150mg  daily- order placed  5.  Neuropsych/cognition: This patient is not fully capable of making decisions on her own behalf. -reviewed cognitive recovery with patient this week. May take until this summer to plateau. Had some mild memory deficits PTA as well.   6. Skin/Wound Care: WOC to follow for wound VAC changes --MASD managed by fecal system-- flexiseal clogged 09/04/22 AM but nursing states it's now working; monitor--might be able to d/c soon? -2/21 WOC stopped VAC, start aquacel and dressing -see flexiseal discussion below, #17 -2/27 WOC following, slough pulled away revealing 2cm deep defect -2/29 Spoke with Dr. Zada Finders today about wound depth, continue local wound care- continue to monitor, continue abx per ID,  no plan for procedure at this time -3/4 Seen by [facility]- continue aquacel, abd pad/tape, apprecaite assistance -3/7 WOC rec medihoney, fill wound with moistened gauza, distal portion of wound to be backed with silver hydrofiber, appreciate assistance -3/8 Dr. Zada Finders contacted about wound, feels like this is doing well overall, continue current wound care orders, appreciate assistance -3/14-15 changed dressing to collagenase to see if we can get better debridement of fibronecrotic tissue. Small area really needs a bit of debridement  -I contacted NS re: f/u wound next week -3/21 seen by [facility] today, appreciate note and assistance  7. Fluids/Electrolytes/Nutrition: Monitor I/O.              -monitor intake closely, continue vitamins -2/19 discontinue core track, Consult dietician -2/27 decrease BMP to every other day -3/14--change labs to weekly -10/01/22 PICC removed  8.Sepsis w/ osteomyelitis/meningitis/encephalitis: Continue Cefepime 2g q8h, Vanc 1250mg  q12h, and Flagyl 500mg  q8h for at least 6 weeks --Vanc+Cefepime complete. Metronidazole has been discontinued as of 09/16/22 (was primarily for PNA, no longer needed) -3/13 --reviewed repeat MRI of brain with resolution! Spoke with NS -3/15  converting to levaquin 750mg  QD and doxycycline 100mg  BID today -f/u with Dr. [name] scheduled for 3/25--since she'll be here, he or partner can see  while in rehab.  -3/19 we will contact Dr. Drucilla Schmidt to see if any alternative antibiotics are available due to nausea -3/20 Dr [name] recommended continue doxycyline, advised pt to take medications with food to minimize nausea -3/21 Pt would like to know if ID wound like to see her inpatient since she has f/u day after discharge scheduled, Called ID office- Dr. [name] will be outpatient that week, Office to call and ask if they would prefer a different date for the ID visit, appreciate assistance -10/10/22 advised family to call office and see if upcoming appt could be postponed, likely would be fine since she'll be on the doxy/levaquin for a few weeks at least.  -3/26 seen by ID, continue doxy and levo at least until  f/u with ID 11/02/22   9. H/o PVT:  Monitor BP/heart rate TID--on coreg 12.5mg  BID>>up to 25mg  BID as of 09/10/22, Labetalol 10mg  q2h PRN  -2/23  BP has been a little above goal, increase coreg dose to 25mg  BID  -09/25/22 BP/HR with fair control, continue to monitor -09/26/22 BP running high, restarted Losartan 25mg  QHS (previously on 100mg ) -3/23-24/24 BP/HR well controlled     10/12/2022    7:39 AM 10/12/2022    6:35 AM 10/11/2022    8:04 PM  Vitals with BMI  Weight 140 lbs 10 oz    Systolic  [id number]  Diastolic  72 79  Pulse  84 93      10. Urinary retention: Has failed 4 voiding trails. Has had foley since 12/23             -maintain foley cath, foley mgt per protocol -- Continue Flomax 0.4mg  qPM. Will d/c urecholine.  -10/02/22 pt and daughter curious when TOV might occur; hoping to get her stronger and able to get to [facility] better before doing so; discussed that this will be a weekday team discussion, may end up going home with it -3/19 voiding trial today -3/20 Has required IC, continue voiding trial, increase flomax to 0.8mg ,   may need f/u with urology -3/22 Trial urocholine 5mg  TID, consider restart foley tomorrow -10/09/22 initially still not voiding much, but had a larger void after eval today; wants to hold off on foley for now, continue TOV but consider replacement of foley if still having difficulty overnight/this weekend Urinary retention withonly small volume voids, pt now c/o that bladder has dropped, family (daughter) asking for uro eval, spoke with Dr Cain Sieve on call who will eval but states that likely management will be inserted foley at discharge with a f/u OP appt with Uro -3/26 Seen by urology-appreciate assistance, IC attempted, discussed with pt- she would like to restart foley tonight-She is having difficulty with IC and may be difficult tor remember IC scheduled, order paced  11. VDRF s/p trach: decannulated 09/15/22 -I ordered downsizing to CFS # 4 today--tolerated well -continue ATC w/ pulmonary hygiene -begin day time plugging of trach tomorrow. Decannulate soon. -Trial of capping with respiratory-  can trial 24 hour capping today -2/22 Leave trach uncapped tonight -2/28 plan for decannulation today, has had trach capped for >72hours -2/29 Lurline Idol was  removed, doesn't need bandaid anymore on trach site   12. T2DM: Monitor BS ac/hs and use SSI for elevated BS.               --continue liberalized diet for now. -3/15 no longer checking cbgs per pt/family request although am serum glucose was elevated yesterday-- - resumed metformin 500mg   bid  -consider changing to CM diet at some point -3/18 glucose stable 104   13. Hypokalemia: Recurrent likely due to diarrhea/intake.  -09/18/22 stable K+ 4.2 on 09/17/22, wants to change to BID dosing so switched Klor to 21mEq BID (still will have 92mEq total/day)  -3/18 k+ stable at 4.1  14. Anemia of critical illness: Hgb 7.5-8.9 range -stable at 10.4---up to 11.0 on 3/26    Latest Ref Rng & Units 10/11/2022    8:58 AM 10/04/2022    7:38 AM 09/27/2022    5:28  AM  CBC  WBC 4.0 - 10.5 K/uL 7.3  7.1  8.0   Hemoglobin 12.0 - 15.0 g/dL 11.0  10.4  9.9   Hematocrit 36.0 - 46.0 % 33.2  31.5  31.6   Platelets 150 - 400 K/uL 416  475  411      15. Hyponatremia/hypochloremia:  -09/04/22 Na 129, Cl 95, both stable, continue salt tabs 1g BID -Cortisol testing done during hospitalization (13.8 on 08/30/22), was normal, but daughter and pt want to make sure this is followed up- defer to weekday team regarding ongoing ?Addison's testing -2/21 129, a little decreased, fluid restriction started -2/28 stable at 131 -09/21/22 Na stable at 130, continue to follow with  q48h BMPs- adjust to 8am labs -Fluid restrictions liberalized to 1535ml -3/11 Na stable at 130, monitor on weekly labs -3/15 will relax to 1800 cc -326 stable Na 131  16. Encephalopathy/Delirium: improved substantially  17. Loose stools likely d/t TF and abx.            -recent rectal tube, since dc'ed  -generally improved -09/30/22 LBM --mushy but formed -10/03/22 more formed BMs, LBM 10/02/22, continue monitoring -LBM  10/09/22, cont regimen  18. Hypothyroidism: continue synthroid 47mcg QD  19. Chronic combined CHF, BLE Swelling: recent 2D echo done 08/20/2022 showed apical hypokinesis, LVEF 40-45%, RV function was reduced. Intermittently diuresed during hospitalization, most recently given 40mg  IV lasix on 09/03/22 -09/04/22 noted to still have BLE swelling, start 20mg  PO lasix and monitor lytes/BP/swelling -3/5 wt a little up, monitor tend -3/6 asked nutrition to stop milk/carnation- she wasn't drinking but it was counting toward her fluid restriction -3/7 wt stable, fluid restrictions liberalized to 1562ml -09/25/22 wt stable, monitor -10/01/22 weight stable to decreased. Not sure it's real although po intake has been sporadic -10/02/22 wt uptrending but stable. Monitor  -3/26 no signs of fluid overload, continue to monitor wt [employer] Weights   10/11/22 0456 10/11/22 0632 10/12/22 0739  Weight:  65.3 kg 60.7 kg 63.8 kg    20. Oral ulcer  --2/28 Orajel for oral ulcer discomfort  21.  Dry mucous membranes, may be related to anticholinergic meds cyclobenzaprine and trazodone, pain and sleep are   better so reluctant to stop at this time  22. Nausea: somewhat chronic, takes meds on empty stomach in the morning, compazine given and helped, continue PRN compazine 5-10mg , encouraged pt to have some food before meds  -3/9 Continue compazine,  advised to take meds with food in AM -10/09/22 still struggling with having AM nausea d/t inadequate intake for meds-- will order Compazine 10mg  scheduled in the mornings with breakfast.  -10/10/22 nausea significantly improved, cont scheduled AM compazine and PO intake with AM meds.    LOS: 39 days A FACE TO FACE EVALUATION WAS PERFORMED  Jennye Boroughs 10/12/2022, 9:27 AM

## 2022-10-13 ENCOUNTER — Telehealth (HOSPITAL_COMMUNITY): Payer: Self-pay | Admitting: Pharmacy Technician

## 2022-10-13 ENCOUNTER — Other Ambulatory Visit (HOSPITAL_COMMUNITY): Payer: Self-pay

## 2022-10-13 ENCOUNTER — Other Ambulatory Visit: Payer: Self-pay | Admitting: Physical Medicine and Rehabilitation

## 2022-10-13 MED ORDER — DONEPEZIL HCL 10 MG PO TABS: 10.0000 mg | ORAL_TABLET | Freq: Every day | ORAL | 0 refills | Status: DC

## 2022-10-13 MED ORDER — LEVOTHYROXINE SODIUM 88 MCG PO TABS: 88.0000 ug | ORAL_TABLET | Freq: Every day | ORAL | 0 refills | Status: AC

## 2022-10-13 MED ORDER — FLUVOXAMINE MALEATE 100 MG PO TABS: 100.0000 mg | ORAL_TABLET | Freq: Every day | ORAL | 0 refills | Status: AC

## 2022-10-13 MED ORDER — LAMOTRIGINE 200 MG PO TABS: 200.0000 mg | ORAL_TABLET | Freq: Every day | ORAL | 0 refills | Status: DC

## 2022-10-13 MED ORDER — SANTYL 250 UNIT/GM EX OINT: 1.0000 | TOPICAL_OINTMENT | Freq: Every day | CUTANEOUS | 0 refills | Status: DC

## 2022-10-13 MED ORDER — CHLORHEXIDINE GLUCONATE CLOTH 2 % EX PADS
6.0000 | MEDICATED_PAD | Freq: Every day | CUTANEOUS | Status: DC
  Administered 2022-10-13: 6 via TOPICAL

## 2022-10-13 NOTE — Progress Notes (Signed)
Inpatient Rehabilitation Discharge Medication Review by a Pharmacist  A complete drug regimen review was completed for this patient to identify any potential clinically significant medication issues.  High Risk Drug Classes Is patient taking? Indication by Medication  Antipsychotic No   Anticoagulant No   Antibiotic Yes PO doxycycline, levofloxacin- osteo, meningitis   Opioid Yes Butrans patch, Percocet - pain  Antiplatelet No   Hypoglycemics/insulin No   Vasoactive Medication Yes Carvedilol, losartan, furosemide - BP Tamsulosin - urinary retention  Chemotherapy No   Other Yes Cyclobenzaprine - muscle spasms  Donepezil - memory Fluvoxamine, lamotrigine, Buproprion- mood Gabapentin - neuropathy Levothyroxine - low thyroid Ondansetron - prn N/V Sodium tablets - low sodium Trazodone - sleep Famotidine - reflux Lidocaine patches - pain Potassium - supplement     Type of Medication Issue Identified Description of Issue Recommendation(s)  Drug Interaction(s) (clinically significant)     Duplicate Therapy     Allergy     No Medication Administration End Date     Incorrect Dose     Additional Drug Therapy Needed     Significant med changes from prior encounter (inform family/care partners about these prior to discharge).    Other       Clinically significant medication issues were identified that warrant physician communication and completion of prescribed/recommended actions by midnight of the next day:  No  Pharmacist comments: None  Time spent performing this drug regimen review (minutes): 20 minutes  Thank you Anette Guarneri, PharmD

## 2022-10-13 NOTE — Consult Note (Signed)
[facility] Nurse wound follow up Wound type:Lumbar dehiscence.  Filling defect with aquacel.  Medihoney to slough on perimeter Measurement:1.5 x 1.3 x 1.2  increased slough noted from 7 to 9 o'clock.  Patient states dressing had become dislodged overnight.  Dressing was replaced but inadequately packed.   Wound bed:15% slough, the base of the wound has small area of bruising.  We discuss offloading pressure to this area at home.  The opening is in the middle of a bony prominence, the remaining segment of the surgical line incision is healed.  Drainage (amount, consistency, odor) moderate serosanguinous  no odor Periwound: suture line along spine is healed except proximal end dehiscence Dressing procedure/placement/frequency: Continue Medihoney to slough, fill defect with skinny strip of aquacel gently packed into wound.  Top with silicone foam.  Change daily.  As drainage decreases, may reduce dressing changes to every other day.  Supplies provided for home. Anticipating discharge today.  Daughter performs wound care this morning.  Son and spouse in room and observe care.  Patient discharging with foley catheter.  Has follow up appointments with Neurosurgery and Urology.   Step by step instructions are provided in my last note.  This can be printed and provided to family for reference.  Will not follow at this time.  Please re-consult if needed.  Estrellita Ludwig MSN, RN, FNP-BC CWON Wound, Ostomy, Continence Nurse [facility] [phone] Pager [phone]

## 2022-10-13 NOTE — Progress Notes (Signed)
60 French urinary catheter inserted at hs with urine returned. Pt tolerated well. Educated pt on foley care.

## 2022-10-13 NOTE — Telephone Encounter (Signed)
Patient Advocate Encounter   Received notification that prior authorization for Diphenoxylate-Atropine 2.5-0.025MG  tablets is required.   PA submitted on 10/13/2022 [place] Electronic Request Form Status is pending       Lyndel Safe, [place] Patient Advocate Specialist [facility] Patient Advocate Team Direct Number: [phone]  Fax: [phone]

## 2022-10-13 NOTE — Progress Notes (Signed)
Patient refused CHG bath,request regular bath and provided completed along with foley care and education regarding foley care.[name]

## 2022-10-13 NOTE — Progress Notes (Signed)
PROGRESS NOTE   Subjective/Complaints:  DC home today. Catheter was restarted yesterday.  Discussed plan for f/u visits. Family has been provided education on wound care and foley care.   ROS: No CP or SOB, no N/V, abdominal pain + back pain  Objective:   No results found. Recent Labs    10/11/22 0858  WBC 7.3  HGB 11.0*  HCT 33.2*  PLT 416*         Recent Labs    10/11/22 0858  NA 131*  K 4.1  CL 99  CO2 24  GLUCOSE 84  BUN 6*  CREATININE 0.43*  CALCIUM 9.4       Intake/Output Summary (Last 24 hours) at 10/13/2022 0838 Last data filed at 10/13/2022 0630 Gross per 24 hour  Intake 720 ml  Output 1675 ml  Net -955 ml         Physical Exam: Vital Signs Blood pressure 123/79, pulse 65, temperature 98.7 F (37.1 C), temperature source Oral, resp. rate 16, height 5\' 1"  (1.549 m), weight 64.1 kg, SpO2 98 %.   Constitutional:  thin, NAD, appears comfortable HENT: [place]/AT,  MMM   Eyes: Conjugate gaze, conjunctiva clear, PERRL Neck: Trach site well healed Cardiovascular: RRR, soft murmur heard (VSD) Pulmonary: CTAB no w/r/r, good air movement Abdominal: Soft, nontender, nondistended, positive bowel sounds/normal active Genitourinary:    Comments:Sacral wound dressing not reassessed today, seen by wound care few minutes before I got there MsK:no joint swelling noted Ext: no clubbing, cyanosis, or edema Neuro: CN 2-12 grossly intact, memory deficits, alert and awake,motor strength is 5/5 in bilateral deltoid, bicep, tricep, grip, 3/5 right and 4/5 left hip flexor, knee extensors, ankle dorsiflexor and plantar flexor  Psych: pleasant, appropriate         Assessment/Plan: 1. Functional deficits which require 3+ hours per day of interdisciplinary therapy in a comprehensive inpatient rehab setting. Physiatrist is providing close team supervision and 24 hour management of active medical problems  listed below. Physiatrist and rehab team continue to assess barriers to discharge/monitor patient progress toward functional and medical goals  Care Tool:  Bathing  Bathing activity did not occur: Refused Body parts bathed by patient: Right arm, Left arm, Right lower leg, Left lower leg, Chest, Abdomen, Face, Buttocks, Front perineal area, Left upper leg, Right upper leg         Bathing assist Assist Level: Minimal Assistance - Patient > 75%     Upper Body Dressing/Undressing Upper body dressing   What is the patient wearing?: Pull over shirt    Upper body assist Assist Level: Supervision/Verbal cueing    Lower Body Dressing/Undressing Lower body dressing      What is the patient wearing?: Pants     Lower body assist Assist for lower body dressing: Minimal Assistance - Patient > 75%     Toileting Toileting Toileting Activity did not occur (Clothing management and hygiene only): N/A (no void or bm) (using catheter and rectal tube)  Toileting assist Assist for toileting: Minimal Assistance - Patient > 75%     Transfers Chair/bed transfer  Transfers assist  Chair/bed transfer activity did not occur: Safety/medical concerns  Chair/bed transfer assist  level: Supervision/Verbal cueing     Locomotion Ambulation   Ambulation assist   Ambulation activity did not occur: Safety/medical concerns  Assist level: Contact Guard/Touching assist Assistive device: Walker-rolling Max distance: 42'   Walk 10 feet activity   Assist  Walk 10 feet activity did not occur: Safety/medical concerns  Assist level: Contact Guard/Touching assist Assistive device: Walker-rolling   Walk 50 feet activity   Assist Walk 50 feet with 2 turns activity did not occur: Safety/medical concerns         Walk 150 feet activity   Assist Walk 150 feet activity did not occur: Safety/medical concerns         Walk 10 feet on uneven surface  activity   Assist Walk 10 feet on  uneven surfaces activity did not occur: Safety/medical concerns         Wheelchair     Assist Is the patient using a wheelchair?: Yes Type of Wheelchair: Manual Wheelchair activity did not occur: Safety/medical concerns  Wheelchair assist level: Supervision/Verbal cueing Max wheelchair distance: 30'    Wheelchair 50 feet with 2 turns activity    Assist    Wheelchair 50 feet with 2 turns activity did not occur: Safety/medical concerns   Assist Level: Minimal Assistance - Patient > 75%   Wheelchair 150 feet activity     Assist  Wheelchair 150 feet activity did not occur: Safety/medical concerns   Assist Level: Maximal Assistance - Patient 25 - 49%   Blood pressure 123/79, pulse 65, temperature 98.7 F (37.1 C), temperature source Oral, resp. rate 16, height 5\' 1"  (1.549 m), weight 64.1 kg, SpO2 98 %.  Medical Problem List and Plan: 1. Functional deficits secondary to osteomyelitis, infection of lumbar surgical site complicated by meningitis/ventriculitis.             -pt also with significant deconditioning             -patient may not yet shower -ELOS/Goals: extended to 3/27, pt agreeable.  Min/Mod A goals with PT, OT, SLP  -Continue CIR therapies including PT, OT, and SLP   -DC  home today    2.  Antithrombotics: -DVT/anticoagulation:  Pharmaceutical: Lovenox 40mg  QD             -antiplatelet therapy: N/a  3. Pain Management: Butrans 21mcg/hr patch for pain control w/ Flexeril 5mg  TID>>10mg  TID, Tylenol PRN, Oxycodone 7.5mg  q4h PRN, Gabapentin 100mg  TID  -2/26 tylenol PRN for HA -2/28 Fioricet ordered PRN headache -09/17/22 will change PRN oxycodone 2.5mg  to percocet 5-325mg , consider increase gabapentin to help with pain and HA- make changes gradually due to hx of encephalopathy  -3/7 Voltaren gel for L shoulder pain -3/8   lidocaine for chest wall tenderness started -3/15- continue flexeril  10mg  tid -She's using oxycodone 5mg  3-4x daily, butrans ptch  51mcg/hr per week as well as gabapentin tid and tylenol and butalbital prn -10/02/22 pain overall managed but breakthrough pain happening more and not as well managed, considered Tramadol but this would not be beneficial per pharmacy due to her butrans patch. Don't want to go up on Oxycodone if we can avoid it; pt agreeable to tracking pain over the weekend, and discussing with weekday team whether longer acting opioid would be more beneficial with shorter acting PRNs; if pain uncontrolled this weekend, may switch. Monitor for now.  -Ordered lidoderm patches x2 for low back to see if we can get relief that way -10/03/22 pain doing some better, will ask primary team regarding  pain regimen but for now she's doing alright.  -3/18 continue current regimen for now, asked nursing to replace butrans patch that has fallen off -10/09/22 pain controlled, curious as to plan for d/c-- who will rx meds, will she wean-- advised to speak with weekday providers.  -10/10/22 interested in weaning, will decrease oxycodone to 2.5mg  q6h scheduled, leave percocet 5-325mg  q4h PRN (she's using it about 3x/day currently), and increase gabapentin to 200mg  TID. Eventual goal is to wean entirely -10/12/22 increase  oxycodone back to 5mg  four times daily -Continue butrans and PRN short coarse percocet on discharge  4. Mood/Behavior/Sleep: LCSW to follow for evaluation and support.             --Trazodone 100mg  QHS and melatonin 5mg  QHS PRN for  insomnia/mood  -09/04/22 placed sign on door to minimize interruptions 11p-5a; changed CBG checks as below to minimize interruptions             -antipsychotic agents:  Luvox 100mg  QHS and Lamictal 200mg  QD  -Continue Aricept 10mg  QD  -Seen by neuropsych-appreciate assistance  -3/1 increase melatonin to 10mg  dose QHS PRN  -3/4 decrease melatonin back to 5mg  dose QHS PRN -3/11 melatonin 5mg  working reasonably well  -3/15 seems to be dealing with extended LOS a bit better today   -team to  continue to provide ego support  -10/04/22 discussed with Dr. Lovette Cliche, psychiatry consult was placed regarding psychiatric medications, depression, anxiety -3/21 Pt with hx of bipolar Disorder, seen by psychiatry today, psych rec restart wellbutrin Xl 150mg  daily- order placed  5. Neuropsych/cognition: This patient is not fully capable of making decisions on her own behalf. -reviewed cognitive recovery with patient this week. May take until this summer to plateau. Had some mild memory deficits PTA as well.   6. Skin/Wound Care: WOC to follow for wound VAC changes --MASD managed by fecal system-- flexiseal clogged 09/04/22 AM but nursing states it's now working; monitor--might be able to d/c soon? -2/21 WOC stopped VAC, start aquacel and dressing -see flexiseal discussion below, #17 -2/27 WOC following, slough pulled away revealing 2cm deep defect -2/29 Spoke with Dr. Zada Finders today about wound depth, continue local wound care- continue to monitor, continue abx per ID,  no plan for procedure at this time -3/4 Seen by [place]- continue aquacel, abd pad/tape, apprecaite assistance -3/7 WOC rec medihoney, fill wound with moistened gauza, distal portion of wound to be backed with silver hydrofiber, appreciate assistance -3/8 Dr. Zada Finders contacted about wound, feels like this is doing well overall, continue current wound care orders, appreciate assistance -3/14-15 changed dressing to collagenase to see if we can get better debridement of fibronecrotic tissue. Small area really needs a bit of debridement  -I contacted NS re: f/u wound next week -3/21 seen by [place] today, appreciate note and assistance -3/27 WOC reviewed wound care education, family reports they understand  7. Fluids/Electrolytes/Nutrition: Monitor I/O.              -monitor intake closely, continue vitamins -2/19 discontinue core track, Consult dietician -2/27 decrease BMP to every other day -3/14--change labs to  weekly -10/01/22 PICC removed  8.Sepsis w/ osteomyelitis/meningitis/encephalitis: Continue Cefepime 2g q8h, Vanc 1250mg  q12h, and Flagyl 500mg  q8h for at least 6 weeks --Vanc+Cefepime complete. Metronidazole has been discontinued as of 09/16/22 (was primarily for PNA, no longer needed) -3/13 --reviewed repeat MRI of brain with resolution! Spoke with NS -3/15 converting to levaquin 750mg  QD and doxycycline 100mg  BID today -f/u with Dr. [name]  scheduled for 3/25--since she'll be here, he or partner can see while in rehab.  -3/19 we will contact Dr. Drucilla Schmidt to see if any alternative antibiotics are available due to nausea -3/20 Dr [name] recommended continue doxycyline, advised pt to take medications with food to minimize nausea -3/21 Pt would like to know if ID wound like to see her inpatient since she has f/u day after discharge scheduled, Called ID office- Dr. [name] will be outpatient that week, Office to call and ask if they would prefer a different date for the ID visit, appreciate assistance -10/10/22 advised family to call office and see if upcoming appt could be postponed, likely would be fine since she'll be on the doxy/levaquin for a few weeks at least.  -3/26 seen by ID, continue doxy and levo at least until  f/u with ID 11/02/22 -3/27 nausea with medications this AM, she did not eat breakfest- recommend meal with medications F/u with NSGY   9. H/o PVT:  Monitor BP/heart rate TID--on coreg 12.5mg  BID>>up to 25mg  BID as of 09/10/22, Labetalol 10mg  q2h PRN  -2/23  BP has been a little above goal, increase coreg dose to 25mg  BID  -09/25/22 BP/HR with fair control, continue to monitor -09/26/22 BP running high, restarted Losartan 25mg  QHS (previously on 100mg ) -3/23-24/24 BP/HR well controlled     10/13/2022    5:41 AM 10/13/2022    5:00 AM 10/12/2022    8:10 PM  Vitals with BMI  Weight  141 lbs 5 oz   Systolic [id number]  [id number]  Diastolic 79  71  Pulse 65  95      10. Urinary retention: Has  failed 4 voiding trails. Has had foley since 12/23             -maintain foley cath, foley mgt per protocol -- Continue Flomax 0.4mg  qPM. Will d/c urecholine.  -10/02/22 pt and daughter curious when TOV might occur; hoping to get her stronger and able to get to [facility] better before doing so; discussed that this will be a weekday team discussion, may end up going home with it -3/19 voiding trial today -3/20 Has required IC, continue voiding trial, increase flomax to 0.8mg ,  may need f/u with urology -3/22 Trial urocholine 5mg  TID, consider restart foley tomorrow -10/09/22 initially still not voiding much, but had a larger void after eval today; wants to hold off on foley for now, continue TOV but consider replacement of foley if still having difficulty overnight/this weekend Urinary retention withonly small volume voids, pt now c/o that bladder has dropped, family (daughter) asking for uro eval, spoke with Dr Cain Sieve on call who will eval but states that likely management will be inserted foley at discharge with a f/u OP appt with Uro -3/26 Seen by urology-appreciate assistance, IC attempted, discussed with pt- she would like to restart foley tonight-She is having difficulty with IC and may be difficult tor remember IC scheduled, order paced -3/27 foley restarted, family and patient educated on care, f/u with urology   11. VDRF s/p trach: decannulated 09/15/22 -I ordered downsizing to CFS # 4 today--tolerated well -continue ATC w/ pulmonary hygiene -begin day time plugging of trach tomorrow. Decannulate soon. -Trial of capping with respiratory-  can trial 24 hour capping today -2/22 Leave trach uncapped tonight -2/28 plan for decannulation today, has had trach capped for >72hours -2/29 Lurline Idol was  removed, doesn't need bandaid anymore on trach site   12. T2DM: Monitor BS ac/hs and use SSI  for elevated BS.               --continue liberalized diet for now. -3/15 no longer checking cbgs per pt/family  request although am serum glucose was elevated yesterday-- - resumed metformin 500mg   bid  -consider changing to CM diet at some point -3/18 glucose stable 104   13. Hypokalemia: Recurrent likely due to diarrhea/intake.  -09/18/22 stable K+ 4.2 on 09/17/22, wants to change to BID dosing so switched Klor to 42mEq BID (still will have 67mEq total/day)  -3/18 k+ stable at 4.1  14. Anemia of critical illness: Hgb 7.5-8.9 range -stable at 10.4---up to 11.0 on 3/26    Latest Ref Rng & Units 10/11/2022    8:58 AM 10/04/2022    7:38 AM 09/27/2022    5:28 AM  CBC  WBC 4.0 - 10.5 K/uL 7.3  7.1  8.0   Hemoglobin 12.0 - 15.0 g/dL 11.0  10.4  9.9   Hematocrit 36.0 - 46.0 % 33.2  31.5  31.6   Platelets 150 - 400 K/uL 416  475  411      15. Hyponatremia/hypochloremia:  -09/04/22 Na 129, Cl 95, both stable, continue salt tabs 1g BID -Cortisol testing done during hospitalization (13.8 on 08/30/22), was normal, but daughter and pt want to make sure this is followed up- defer to weekday team regarding ongoing ?Addison's testing -2/21 129, a little decreased, fluid restriction started -2/28 stable at 131 -09/21/22 Na stable at 130, continue to follow with  q48h BMPs- adjust to 8am labs -Fluid restrictions liberalized to 1553ml -3/11 Na stable at 130, monitor on weekly labs -3/15 will relax to 1800 cc -326 stable Na 131  16. Encephalopathy/Delirium: improved substantially  17. Loose stools likely d/t TF and abx.            -recent rectal tube, since dc'ed  -generally improved -09/30/22 LBM --mushy but formed -10/03/22 more formed BMs, LBM 10/02/22, continue monitoring -LBM  10/09/22, cont regimen  18. Hypothyroidism: continue synthroid 5mcg QD  19. Chronic combined CHF, BLE Swelling: recent 2D echo done 08/20/2022 showed apical hypokinesis, LVEF 40-45%, RV function was reduced. Intermittently diuresed during hospitalization, most recently given 40mg  IV lasix on 09/03/22 -09/04/22 noted to still have BLE  swelling, start 20mg  PO lasix and monitor lytes/BP/swelling -3/5 wt a little up, monitor tend -3/6 asked nutrition to stop milk/carnation- she wasn't drinking but it was counting toward her fluid restriction -3/7 wt stable, fluid restrictions liberalized to 1595ml -09/25/22 wt stable, monitor -10/01/22 weight stable to decreased. Not sure it's real although po intake has been sporadic -10/02/22 wt uptrending but stable. Monitor  -3/27 f/u PCP, no signs of fluid overload  Filed Weights   10/11/22 0632 10/12/22 0739 10/13/22 0500  Weight: 60.7 kg 63.8 kg 64.1 kg    20. Oral ulcer  --2/28 Orajel for oral ulcer discomfort  21.  Dry mucous membranes, may be related to anticholinergic meds cyclobenzaprine and trazodone, pain and sleep are   better so reluctant to stop at this time  22. Nausea: somewhat chronic, takes meds on empty stomach in the morning, compazine given and helped, continue PRN compazine 5-10mg , encouraged pt to have some food before meds  -3/9 Continue compazine,  advised to take meds with food in AM -10/09/22 still struggling with having AM nausea d/t inadequate intake for meds-- will order Compazine 10mg  scheduled in the mornings with breakfast.  -10/10/22 nausea significantly improved, cont scheduled AM compazine and PO intake with AM  meds.    LOS: 40 days A FACE TO FACE EVALUATION WAS PERFORMED  Jennye Boroughs 10/13/2022, 8:38 AM

## 2022-10-13 NOTE — Progress Notes (Signed)
Recreational Therapy Discharge Summary Patient Details  Name: [name] MRN: [id number] Date of Birth: 1956-03-06 Today's Date: 10/13/2022  Comments on progress toward goals: TR sessions focused on pt education in regards to activity analysis/modifications, community reintegration, stress management/coping.  Pt participated in community reintegration/outing to the [employer] w/c level with emphasis on safe mobility, identification/negotiation of obstacles, energy conservation and social interaction with community members.  Pt is excited to return home today with hopes of returning to previously enjoyed activities.  Reasons for discharge: discharge from hospital  Follow-up: [place] agrees with progress made and goals achieved: Yes  Zaquan Duffner 10/13/2022, 10:30 AM

## 2022-10-13 NOTE — Telephone Encounter (Signed)
Patient Advocate Encounter  Prior Authorization for Diphenoxylate-Atropine 2.5-0.025MG  tablets has been approved.    PA# [id number] Insurance [place] Electronic Request Form Effective dates: 10/13/2022 through 10/13/2023      Lyndel Safe, [place] Patient Advocate Specialist [place] Patient Advocate Team Direct Number: [phone]  Fax: [phone]

## 2022-10-14 ENCOUNTER — Inpatient Hospital Stay: Payer: Medicare Other | Admitting: Infectious Disease

## 2022-10-15 ENCOUNTER — Other Ambulatory Visit (HOSPITAL_COMMUNITY): Payer: Self-pay

## 2022-11-02 ENCOUNTER — Encounter: Payer: Self-pay | Admitting: Infectious Disease

## 2022-11-02 ENCOUNTER — Ambulatory Visit: Payer: Medicare Other | Admitting: Infectious Disease

## 2022-11-02 ENCOUNTER — Other Ambulatory Visit: Payer: Self-pay

## 2022-11-02 VITALS — BP 122/81 | HR 96 | Resp 16 | Ht 61.0 in | Wt 141.2 lb

## 2022-11-02 DIAGNOSIS — D72829 Elevated white blood cell count, unspecified: Secondary | ICD-10-CM

## 2022-11-02 DIAGNOSIS — G0490 Encephalitis and encephalomyelitis, unspecified: Secondary | ICD-10-CM

## 2022-11-02 DIAGNOSIS — B9689 Other specified bacterial agents as the cause of diseases classified elsewhere: Secondary | ICD-10-CM | POA: Insufficient documentation

## 2022-11-02 DIAGNOSIS — G009 Bacterial meningitis, unspecified: Secondary | ICD-10-CM

## 2022-11-02 DIAGNOSIS — T847XXD Infection and inflammatory reaction due to other internal orthopedic prosthetic devices, implants and grafts, subsequent encounter: Secondary | ICD-10-CM

## 2022-11-02 DIAGNOSIS — T847XXS Infection and inflammatory reaction due to other internal orthopedic prosthetic devices, implants and grafts, sequela: Secondary | ICD-10-CM

## 2022-11-02 HISTORY — DX: Other specified bacterial agents as the cause of diseases classified elsewhere: B96.89

## 2022-11-02 MED ORDER — LEVOFLOXACIN 750 MG PO TABS: 750.0000 mg | ORAL_TABLET | Freq: Every day | ORAL | 4 refills | Status: DC

## 2022-11-02 MED ORDER — DOXYCYCLINE HYCLATE 100 MG PO TABS: 100.0000 mg | ORAL_TABLET | Freq: Two times a day (BID) | ORAL | 4 refills | Status: DC

## 2022-11-02 NOTE — Progress Notes (Signed)
Subjective:  Chief complaint concerns about elevated white blood cell count and also still with wound and low back pain   Patient ID: [name], female    DOB: 1955/09/03, 67 y.o.   MRN: [id number]  HPI   67 y.o. female with hx of scoliosis surgery with extensive lower thoracic and lumbar fusion roughly 58yrs ago, she suffered ground level fall in late November, where she fractures through all three columns of L3, thus went to OR on 12/9 for stabilization, new  posterolateral instrumentation fusion on June 26, 2022 complicated by Wound dehiscence with exposed hardware and unstable lumbar spine fracture status post revision of lumbar wound with removal of L1-3 4 and 5 lumbar screws and rod and removal of right L1 screw and revision of L1-L5 posterior instrumented fusion on August 09, 2022 and now concern for postoperative wound infection and ventriculitis. She had complicated protracted course in the icu, but eventually was discharged to CIR on vancomycin and cefepime through 3/14 for ventriculitis and hw complicating wound infection. Now changed to doxcycyline plus levofloxacin as of 3/15 while she continued to need medihoney with moistened quaze with sliver hydrofiber to the caudal portion of wound.   Mri of brain on 09/29/22- resolution of ventriculitis.   She was seen by my partner Dr. Drue Second while she was in the inpatient rehab unit.  Follow-up was rescheduled for May for this visit.  Back pain does not appear to be worse but wound does persist and apparently tunnels cranially.  Her white blood cell count with [facility] was in the 16,000 range an abrupt change from her last labs as an inpatient when it was in the 7000 range.  He did have a mechanical fall recently and fell on her left knee where she has a prosthetic joint.  Past Medical History:  Diagnosis Date   Anemia    Anxiety    Arthritis    Bipolar disorder    Depression    GERD (gastroesophageal reflux  disease)    Heart murmur    "related to VSD"   High cholesterol    History of blood transfusion    "related to OR" (08/19/2016)   History of hiatal hernia    Hypertension    Hyperthyroidism    Mild cognitive impairment 09/06/2018   Paroxysmal ventricular tachycardia    Type II diabetes mellitus    UTI (urinary tract infection)    being treated with Keflex   Ventricular septal defect     Past Surgical History:  Procedure Laterality Date   ABDOMINAL HYSTERECTOMY     BACK SURGERY     CARDIAC CATHETERIZATION N/A 06/23/2015   Procedure: Left Heart Cath and Coronary Angiography;  Surgeon: Laurey Morale, MD;  Location: [facility] INVASIVE CV LAB;  Service: Cardiovascular;  Laterality: N/A;   CARDIAC CATHETERIZATION  1960   "VSD was so small; didn't need repaired"   EXAM UNDER ANESTHESIA WITH MANIPULATION OF HIP Right 06/02/2014   dr Jerl Santos   FRACTURE SURGERY     HERNIA REPAIR     HIP CLOSED REDUCTION Right 06/02/2014   Procedure: CLOSED MANIPULATION HIP;  Surgeon: Velna Ochs, MD;  Location: [facility] OR;  Service: Orthopedics;  Laterality: Right;   JOINT REPLACEMENT     JOINT REPLACEMENT     POSTERIOR LUMBAR FUSION 4 LEVEL N/A 06/26/2022   Procedure: Lumbar One To Lumbar Five Posterior Instrumented Fusion;  Surgeon: Jadene Pierini, MD;  Location: [facility] OR;  Service: Neurosurgery;  Laterality:  N/A;   REFRACTIVE SURGERY Bilateral    SHOULDER ARTHROSCOPY Right    SHOULDER OPEN ROTATOR CUFF REPAIR Right    SPINAL FUSION  1996   "t10 down to my coccyx   SPINE HARDWARE REMOVAL     TOTAL ABDOMINAL HYSTERECTOMY     TOTAL HIP ARTHROPLASTY Right 05/10/2014   [facility]      by dr Cristal Deer olcott   TOTAL KNEE ARTHROPLASTY Left    TOTAL SHOULDER ARTHROPLASTY Left 08/19/2016   Procedure: TOTAL SHOULDER ARTHROPLASTY;  Surgeon: Jones Broom, MD;  Location: [facility] OR;  Service: Orthopedics;  Laterality: Left;  Left total shoulder replacement    Family History  Problem Relation Age of Onset    Other Mother        alive   Stroke Father 3       deceased   Dementia Father    Chorea Maternal Grandfather    Dementia Maternal Aunt    Dementia Maternal Aunt    Heart attack Other        multiple uncles have died with myocardial infarction      Social History   Socioeconomic History   Marital status: Married    Spouse name: Not on file   Number of children: Not on file   Years of education: Not on file   Highest education level: Master's degree (e.g., MA, MS, MEng, MEd, MSW, MBA)  Occupational History   Occupation: Magazine features editor: SELF-EMPLOYED    Comment: former  Tobacco Use   Smoking status: Former    Years: 50    Types: Cigarettes    Passive exposure: Never   Smokeless tobacco: Never   Tobacco comments:    03/30/21 smokes 2 cigs daily.      08/06/22 Shanda Bumps stated that the patient is not smoking while at [facility] for rehab. K[facility]  Vaping Use   Vaping Use: Never used  Substance and Sexual Activity   Alcohol use: No   Drug use: No   Sexual activity: Not Currently    Birth control/protection: Surgical    Comment: Hysterectomy  Other Topics Concern   Not on file  Social History Narrative   Right handed   Caffeine 2-3 cups daily    Lives at home with husband    Social Determinants of Health   Financial Resource Strain: Not on file  Food Insecurity: No Food Insecurity (06/27/2022)   Hunger Vital Sign    Worried About Running Out of Food in the Last Year: Never true    Ran Out of Food in the Last Year: Never true  Transportation Needs: No Transportation Needs (06/27/2022)   PRAPARE - Administrator, Civil Service (Medical): No    Lack of Transportation (Non-Medical): No  Physical Activity: Not on file  Stress: Not on file  Social Connections: Not on file    No Known Allergies   Current Outpatient Medications:    acetaminophen (TYLENOL) 325 MG tablet, Take 1-2 tablets (325-650 mg total) by mouth every 6 (six) hours as needed for  mild pain or headache., Disp: , Rfl:    buprenorphine (BUTRANS) 5 MCG/HR PTWK, Place 1 patch onto the skin once a week., Disp: 4 patch, Rfl: 0   buPROPion (WELLBUTRIN XL) 150 MG 24 hr tablet, Take 1 tablet (150 mg total) by mouth daily., Disp: 30 tablet, Rfl: 0   carvedilol (COREG) 25 MG tablet, Take 1 tablet (25 mg total) by mouth 2 (two) times daily with  a meal., Disp: 60 tablet, Rfl: 0   collagenase (SANTYL) 250 UNIT/GM ointment, Apply 1 Application topically daily. Apply 2-3 grams to affected area daily., Disp: 30 g, Rfl: 0   cyclobenzaprine (FLEXERIL) 10 MG tablet, Take 1 tablet (10 mg total) by mouth 3 (three) times daily., Disp: 90 tablet, Rfl: 0   diclofenac Sodium (VOLTAREN) 1 % GEL, Apply 2 g topically 4 (four) times daily., Disp: 350 g, Rfl: 0   diphenoxylate-atropine (LOMOTIL) 2.5-0.025 MG tablet, Take 1 tablet by mouth 2 (two) times daily as needed for diarrhea or loose stools., Disp: 30 tablet, Rfl: 0   donepezil (ARICEPT) 10 MG tablet, Take 1 tablet (10 mg total) by mouth daily., Disp: 30 tablet, Rfl: 0   doxycycline (VIBRA-TABS) 100 MG tablet, Take 1 tablet (100 mg total) by mouth every 12 (twelve) hours., Disp: 60 tablet, Rfl: 0   famotidine (PEPCID) 20 MG tablet, Take 1 tablet (20 mg total) by mouth 2 (two) times daily., Disp: 60 tablet, Rfl: 0   fiber (NUTRISOURCE FIBER) PACK packet, Take 1 packet by mouth 2 (two) times daily., Disp: , Rfl:    fluvoxaMINE (LUVOX) 100 MG tablet, Take 1 tablet (100 mg total) by mouth at bedtime., Disp: 30 tablet, Rfl: 0   gabapentin (NEURONTIN) 100 MG capsule, Take 1 capsule (100 mg total) by mouth 3 (three) times daily., Disp: 90 capsule, Rfl: 0   Lactobacillus (ACIDOPHILUS) 100 MG CAPS, Take 1 capsule (100 mg total) by mouth 2 (two) times daily., Disp: 60 capsule, Rfl: 0   lamoTRIgine (LAMICTAL) 200 MG tablet, Take 1 tablet (200 mg total) by mouth daily., Disp: 30 tablet, Rfl: 0   levofloxacin (LEVAQUIN) 750 MG tablet, Take 1 tablet (750 mg total)  by mouth daily., Disp: 30 tablet, Rfl: 0   levothyroxine (SYNTHROID) 88 MCG tablet, Take 1 tablet (88 mcg total) by mouth daily before breakfast., Disp: 30 tablet, Rfl: 0   lidocaine (LIDODERM) 5 %, Place 2 patches onto the skin daily. Remove & Discard patch within 12 hours or as directed by MD, Disp: 60 patch, Rfl: 0   losartan (COZAAR) 25 MG tablet, Take 1 tablet (25 mg total) by mouth at bedtime., Disp: 30 tablet, Rfl: 0   melatonin 5 MG TABS, Take 1 tablet (5 mg total) by mouth at bedtime as needed., Disp: 30 tablet, Rfl: 0   metFORMIN (GLUCOPHAGE) 500 MG tablet, Take 1 tablet (500 mg total) by mouth 2 (two) times daily with a meal., Disp: 60 tablet, Rfl: 0   Multiple Vitamin (MULTIVITAMIN) tablet, Take 1 tablet by mouth daily.  , Disp: , Rfl:    ondansetron (ZOFRAN) 4 MG tablet, Take 1 tablet (4 mg total) by mouth every 8 (eight) hours as needed for refractory nausea / vomiting., Disp: 20 tablet, Rfl: 0   oxyCODONE-acetaminophen (PERCOCET/ROXICET) 5-325 MG tablet, Take 0.5 tablets by mouth 4 (four) times daily -  with meals and at bedtime., Disp: 60 tablet, Rfl: 0   potassium chloride (KLOR-CON M) 10 MEQ tablet, Take 3 tablets (30 mEq total) by mouth 2 (two) times daily., Disp: 120 tablet, Rfl: 0   sodium chloride 1 g tablet, Take 1 tablet (1 g total) by mouth 2 (two) times daily with a meal., Disp: 60 tablet, Rfl: 0   tamsulosin (FLOMAX) 0.4 MG CAPS capsule, Take 2 capsules (0.8 mg total) by mouth daily after supper., Disp: 60 capsule, Rfl: 0   traZODone (DESYREL) 100 MG tablet, Take 1 tablet (100 mg total) by mouth  at bedtime., Disp: 30 tablet, Rfl: 0   furosemide (LASIX) 20 MG tablet, Take 1 tablet (20 mg total) by mouth daily. (Patient not taking: Reported on 11/02/2022), Disp: 30 tablet, Rfl: 0   Review of Systems  Constitutional:  Negative for activity change, appetite change, chills, diaphoresis, fatigue, fever and unexpected weight change.  HENT:  Negative for congestion, rhinorrhea,  sinus pressure, sneezing, sore throat and trouble swallowing.   Eyes:  Negative for photophobia and visual disturbance.  Respiratory:  Negative for cough, chest tightness, shortness of breath, wheezing and stridor.   Cardiovascular:  Negative for chest pain, palpitations and leg swelling.  Gastrointestinal:  Negative for abdominal distention, abdominal pain, anal bleeding, blood in stool, constipation, diarrhea, nausea and vomiting.  Genitourinary:  Negative for difficulty urinating, dysuria, flank pain and hematuria.  Musculoskeletal:  Positive for back pain. Negative for arthralgias, gait problem, joint swelling and myalgias.  Skin:  Negative for color change, pallor, rash and wound.  Neurological:  Negative for dizziness, tremors, weakness and light-headedness.  Hematological:  Negative for adenopathy. Does not bruise/bleed easily.  Psychiatric/Behavioral:  Negative for agitation, behavioral problems, confusion, decreased concentration, dysphoric mood and sleep disturbance.        Objective:   Physical Exam Constitutional:      General: She is not in acute distress.    Appearance: Normal appearance. She is well-developed. She is not ill-appearing or diaphoretic.  HENT:     Head: Normocephalic and atraumatic.     Right Ear: Hearing and external ear normal.     Left Ear: Hearing and external ear normal.     Nose: No nasal deformity or rhinorrhea.  Eyes:     General: No scleral icterus.    Conjunctiva/sclera: Conjunctivae normal.     Right eye: Right conjunctiva is not injected.     Left eye: Left conjunctiva is not injected.     Pupils: Pupils are equal, round, and reactive to light.  Neck:     Vascular: No JVD.  Cardiovascular:     Rate and Rhythm: Normal rate and regular rhythm.     Heart sounds: Normal heart sounds, S1 normal and S2 normal. No murmur heard.    No friction rub.  Abdominal:     General: Bowel sounds are normal. There is no distension.     Palpations: Abdomen  is soft.     Tenderness: There is no abdominal tenderness.  Musculoskeletal:     Right shoulder: Normal.     Left shoulder: Normal.     Cervical back: Normal range of motion and neck supple.     Right hip: Normal.     Left hip: Normal.     Right knee: Normal.     Left knee: Normal.  Lymphadenopathy:     Head:     Right side of head: No submandibular, preauricular or posterior auricular adenopathy.     Left side of head: No submandibular, preauricular or posterior auricular adenopathy.     Cervical: No cervical adenopathy.     Right cervical: No superficial or deep cervical adenopathy.    Left cervical: No superficial or deep cervical adenopathy.  Skin:    General: Skin is warm and dry.     Coloration: Skin is not pale.     Findings: No abrasion, bruising, ecchymosis, erythema, lesion or rash.     Nails: There is no clubbing.  Neurological:     General: No focal deficit present.     Mental Status: She  is alert and oriented to person, place, and time.     Sensory: No sensory deficit.     Coordination: Coordination normal.     Gait: Gait normal.  Psychiatric:        Attention and Perception: She is attentive.        Mood and Affect: Mood normal.        Speech: Speech normal.        Behavior: Behavior normal. Behavior is cooperative.        Thought Content: Thought content normal.        Judgment: Judgment normal.    Wound 11/02/2022:     Left knee       Assessment & Plan:    67 y.o. female with recent vertebral fracture status post hardware placement which was then exposed removed with revision complicated by CSF leak then admitted with fevers found to have postoperative fluid collection multifocal pneumonia thought to be due to aspiration and possible meningitis and ventricultis   Ventriculitis: resolved  Concern for lumbar would infection with hardware  --I will recheck ESR, CRP, BMP w GFR, CBC w differential  We will continue the doxycyline and levaquin  If  ESR, CRP are up or other significant evidence for infection would get repeat imaging of the spine  She had MRI in February that did not show bone marrow edema  She might prefer CT in terms of being quicker study and less need to hold still though MRI would avoid radiation and would be good thing to compare vs her prior February one.  I have personally spent 32 minutes involved in face-to-face and non-face-to-face activities for this patient on the day of the visit. Professional time spent includes the following activities: Preparing to see the patient (review of tests), Obtaining and/or reviewing separately obtained history (admission/discharge record), Performing a medically appropriate examination and/or evaluation , Ordering medications/tests/procedures, referring and communicating with other health care professionals, Documenting clinical information in the EMR, Independently interpreting results (not separately reported), Communicating results to the patient/family/caregiver, Counseling and educating the patient/family/caregiver and Care coordination (not separately reported).

## 2022-11-03 LAB — CBC WITH DIFFERENTIAL/PLATELET
Absolute Monocytes: 852 cells/uL (ref 200–950)
Basophils Absolute: 96 cells/uL (ref 0–200)
Basophils Relative: 0.8 %
Eosinophils Absolute: 456 cells/uL (ref 15–500)
Eosinophils Relative: 3.8 %
HCT: 34.6 % — ABNORMAL LOW (ref 35.0–45.0)
Hemoglobin: 11.7 g/dL (ref 11.7–15.5)
Lymphs Abs: 1896 cells/uL (ref 850–3900)
MCH: 30 pg (ref 27.0–33.0)
MCHC: 33.8 g/dL (ref 32.0–36.0)
MCV: 88.7 fL (ref 80.0–100.0)
MPV: 8.6 fL (ref 7.5–12.5)
Monocytes Relative: 7.1 %
Neutro Abs: 8700 cells/uL — ABNORMAL HIGH (ref 1500–7800)
Neutrophils Relative %: 72.5 %
Platelets: 490 10*3/uL — ABNORMAL HIGH (ref 140–400)
RBC: 3.9 10*6/uL (ref 3.80–5.10)
RDW: 15.5 % — ABNORMAL HIGH (ref 11.0–15.0)
Total Lymphocyte: 15.8 %
WBC: 12 10*3/uL — ABNORMAL HIGH (ref 3.8–10.8)

## 2022-11-03 LAB — SEDIMENTATION RATE: Sed Rate: 36 mm/h — ABNORMAL HIGH (ref 0–30)

## 2022-11-03 LAB — BASIC METABOLIC PANEL WITH GFR
BUN: 10 mg/dL (ref 7–25)
CO2: 20 mmol/L (ref 20–32)
Calcium: 9.2 mg/dL (ref 8.6–10.4)
Chloride: 101 mmol/L (ref 98–110)
Creat: 0.56 mg/dL (ref 0.50–1.05)
Glucose, Bld: 124 mg/dL — ABNORMAL HIGH (ref 65–99)
Potassium: 4.7 mmol/L (ref 3.5–5.3)
Sodium: 133 mmol/L — ABNORMAL LOW (ref 135–146)
eGFR: 100 mL/min/{1.73_m2} (ref >=60)

## 2022-11-03 LAB — C-REACTIVE PROTEIN: CRP: 15.6 mg/L — ABNORMAL HIGH (ref <8.0)

## 2022-11-04 ENCOUNTER — Inpatient Hospital Stay: Payer: Medicare Other | Admitting: Infectious Disease

## 2022-11-04 ENCOUNTER — Encounter: Payer: Medicare Other | Admitting: Physical Medicine & Rehabilitation

## 2022-11-09 ENCOUNTER — Encounter: Payer: Medicare Other | Attending: Physical Medicine & Rehabilitation | Admitting: Registered Nurse

## 2022-11-09 ENCOUNTER — Encounter: Payer: Self-pay | Admitting: Registered Nurse

## 2022-11-09 VITALS — BP 120/82 | HR 99 | Ht 61.0 in | Wt 139.0 lb

## 2022-11-09 DIAGNOSIS — G8929 Other chronic pain: Secondary | ICD-10-CM

## 2022-11-09 DIAGNOSIS — R0781 Pleurodynia: Secondary | ICD-10-CM | POA: Diagnosis not present

## 2022-11-09 DIAGNOSIS — R339 Retention of urine, unspecified: Secondary | ICD-10-CM

## 2022-11-09 DIAGNOSIS — M545 Low back pain, unspecified: Secondary | ICD-10-CM | POA: Diagnosis not present

## 2022-11-09 DIAGNOSIS — I10 Essential (primary) hypertension: Secondary | ICD-10-CM

## 2022-11-09 DIAGNOSIS — M4626 Osteomyelitis of vertebra, lumbar region: Secondary | ICD-10-CM | POA: Diagnosis present

## 2022-11-09 DIAGNOSIS — W19XXXD Unspecified fall, subsequent encounter: Secondary | ICD-10-CM | POA: Diagnosis not present

## 2022-11-09 MED ORDER — TRAZODONE HCL 100 MG PO TABS: 100.0000 mg | ORAL_TABLET | Freq: Every day | ORAL | 0 refills | Status: AC

## 2022-11-09 MED ORDER — BUPROPION HCL ER (XL) 150 MG PO TB24: 150.0000 mg | ORAL_TABLET | Freq: Every day | ORAL | 0 refills | Status: AC

## 2022-11-09 MED ORDER — LAMOTRIGINE 200 MG PO TABS: 200.0000 mg | ORAL_TABLET | Freq: Every day | ORAL | 0 refills | Status: DC

## 2022-11-09 NOTE — Progress Notes (Signed)
Subjective:    Patient ID: [name], female    DOB: 1955-07-23, 67 y.o.   MRN: [id number]  HPI: [name] is a 67 y.o. female who is here for HFU of her  Infection of Lumbar Spine, Essential Hypertension, Urinary Retention, Right Rib Pain, Acute Exacerbation of Chronic Low Back Pain and Fall subsequent encounter. She was admitted on 08/09/2022 for:  Revision of L1-5 fusion  by Dr Maurice Small. She had a long Hospital Stay see notes for further details.  Neurosurgery, ID, Wound Care consulted.   CT Lumbar Spine: 08/19/2022 : IMPRESSION: 1. Interval removal of the bilateral L1 screws and left L2-L5 screws. 2. Redemonstrated subacute fracture through the L3 vertebral body. No additional acute fracture. No evidence of osteomyelitis. 3. Increased air and fluid in the posterior soft tissues, not unexpected in the setting of recent surgery.  CT Head: WO Contrast:  IMPRESSION: 1. No acute intracranial abnormality. 2. Unchanged chronic small-vessel disease.   MR: Brain: WO Contrast:  MPRESSION: 1. Punctate focus of acute ischemia in the right frontal lobe. 2. Diffusion abnormality within the occipital horn of the left lateral ventricle may indicate proteinaceous/purulent debris. Blood considered less likely given the absence of SWI abnormality and lack of hemorrhage elsewhere. 3. No hemorrhage or mass effect.  [name] was admitted to inpatient Rehabilitation on 09/03/2022 and discharged home on 10/13/2022. She is receiving Home Health Therapy with [facility]. She states she has pain in her lower back and right rib pain ( post fall from transferring from car on April 9th). She rates her pain 7.   [name] and daughter states on April 03/2023 , her husband was transferring her from car to wheelchair, when she fell to her knees. Her family and bystander helped her up, she didn't seek medical attention. Educated on Falls prevention.   Today's visit also  included a face to face wheelchair evaluation. [name] will benefit from light weight folding wheelchair with swing away leg rests. With a weight of 13.5lbs. This will help with her mobility in her home, also by improving her independence with activities of daily living.   Pain Inventory Average Pain 6 Pain Right Now 7 My pain is aching  In the last 24 hours, has pain interfered with the following? General activity 0 Relation with others 0 Enjoyment of life 0 What TIME of day is your pain at its worst? morning  Sleep (in general) Fair  Pain is worse with: bending, sitting, and some activites Pain improves with:  medication Relief from Meds: 5  walk with assistance use a walker how many minutes can you walk? 30  ability to climb steps?  no do you drive?  no use a wheelchair needs help with transfers Do you have any goals in this area?  yes  retired  weakness numbness tingling trouble walking spasms depression  Any changes since last visit?  no  Any changes since last visit?  no    Family History  Problem Relation Age of Onset   Other Mother        alive   Stroke Father 32       deceased   Dementia Father    Chorea Maternal Grandfather    Dementia Maternal Aunt    Dementia Maternal Aunt    Heart attack Other        multiple uncles have died with myocardial infarction   Social History   Socioeconomic History   Marital status:  Married    Spouse name: Not on file   Number of children: Not on file   Years of education: Not on file   Highest education level: Master's degree (e.g., MA, MS, MEng, MEd, MSW, MBA)  Occupational History   Occupation: Magazine features editor: SELF-EMPLOYED    Comment: former  Tobacco Use   Smoking status: Former    Years: 50    Types: Cigarettes    Passive exposure: Never   Smokeless tobacco: Never   Tobacco comments:    03/30/21 smokes 2 cigs daily.      08/06/22 Shanda Bumps stated that the patient is not smoking while at  [facility] for rehab. [facility]  Vaping Use   Vaping Use: Never used  Substance and Sexual Activity   Alcohol use: No   Drug use: No   Sexual activity: Not Currently    Birth control/protection: Surgical    Comment: Hysterectomy  Other Topics Concern   Not on file  Social History Narrative   Right handed   Caffeine 2-3 cups daily    Lives at home with husband    Social Determinants of Health   Financial Resource Strain: Not on file  Food Insecurity: No Food Insecurity (06/27/2022)   Hunger Vital Sign    Worried About Running Out of Food in the Last Year: Never true    Ran Out of Food in the Last Year: Never true  Transportation Needs: No Transportation Needs (06/27/2022)   PRAPARE - Administrator, Civil Service (Medical): No    Lack of Transportation (Non-Medical): No  Physical Activity: Not on file  Stress: Not on file  Social Connections: Not on file   Past Surgical History:  Procedure Laterality Date   ABDOMINAL HYSTERECTOMY     BACK SURGERY     CARDIAC CATHETERIZATION N/A 06/23/2015   Procedure: Left Heart Cath and Coronary Angiography;  Surgeon: Laurey Morale, MD;  Location: [facility] INVASIVE CV LAB;  Service: Cardiovascular;  Laterality: N/A;   CARDIAC CATHETERIZATION  1960   "VSD was so small; didn't need repaired"   EXAM UNDER ANESTHESIA WITH MANIPULATION OF HIP Right 06/02/2014   dr Jerl Santos   FRACTURE SURGERY     HERNIA REPAIR     HIP CLOSED REDUCTION Right 06/02/2014   Procedure: CLOSED MANIPULATION HIP;  Surgeon: Velna Ochs, MD;  Location: [facility] OR;  Service: Orthopedics;  Laterality: Right;   JOINT REPLACEMENT     JOINT REPLACEMENT     POSTERIOR LUMBAR FUSION 4 LEVEL N/A 06/26/2022   Procedure: Lumbar One To Lumbar Five Posterior Instrumented Fusion;  Surgeon: Jadene Pierini, MD;  Location: [facility] OR;  Service: Neurosurgery;  Laterality: N/A;   REFRACTIVE SURGERY Bilateral    SHOULDER ARTHROSCOPY Right    SHOULDER OPEN ROTATOR CUFF REPAIR Right     SPINAL FUSION  1996   "t10 down to my coccyx   SPINE HARDWARE REMOVAL     TOTAL ABDOMINAL HYSTERECTOMY     TOTAL HIP ARTHROPLASTY Right 05/10/2014   [facility]      by dr Cristal Deer olcott   TOTAL KNEE ARTHROPLASTY Left    TOTAL SHOULDER ARTHROPLASTY Left 08/19/2016   Procedure: TOTAL SHOULDER ARTHROPLASTY;  Surgeon: Jones Broom, MD;  Location: [facility] OR;  Service: Orthopedics;  Laterality: Left;  Left total shoulder replacement   Past Medical History:  Diagnosis Date   Anemia    Anxiety    Arthritis    Bipolar disorder  Depression    GERD (gastroesophageal reflux disease)    Heart murmur    "related to VSD"   High cholesterol    History of blood transfusion    "related to OR" (08/19/2016)   History of hiatal hernia    Hypertension    Hyperthyroidism    Mild cognitive impairment 09/06/2018   Paroxysmal ventricular tachycardia    Type II diabetes mellitus    UTI (urinary tract infection)    being treated with Keflex   Ventricular septal defect    Ventriculitis of brain due to bacteria 11/02/2022   BP 120/82   Pulse 99   Ht 5\' 1"  (1.549 m)   Wt 139 lb (63 kg)   SpO2 99%   BMI 26.26 kg/m   Opioid Risk Score:   Fall Risk Score:  `1  Depression screen [facility] 2/9     11/02/2022    3:02 PM  Depression screen [facility] 2/9  Decreased Interest 0  Down, Depressed, Hopeless 0  PHQ - 2 Score 0      Review of Systems  All other systems reviewed and are negative.     Objective:   Physical Exam Vitals and nursing note reviewed.  Constitutional:      Appearance: Normal appearance.  Cardiovascular:     Rate and Rhythm: Normal rate and regular rhythm.     Pulses: Normal pulses.     Heart sounds: Normal heart sounds.  Pulmonary:     Effort: Pulmonary effort is normal.     Breath sounds: Normal breath sounds.  Musculoskeletal:     Cervical back: Normal range of motion and neck supple.     Comments: Normal Muscle Bulk and Muscle Testing Reveals:  Upper Extremities:  Full ROM and Muscle Strength 5/5 Thoracic Paraspinal Tenderness: T-3-5 Lumbar Paraspinal Tenderness: L-3-L-5 Lower Extremities: Full ROM and Muscle Strength 5/5 Arrived in wheelchair     Skin:    General: Skin is warm and dry.     Comments: Lumbar Dressing intact: Wound with Aquacel Noted.   Neurological:     Mental Status: She is alert and oriented to person, place, and time.  Psychiatric:        Mood and Affect: Mood normal.        Behavior: Behavior normal.         Assessment & Plan:  Infection of Lumbar Spine: ID following. Continue current medication regimen. Continue to Monitor.  Essential Hypertension: Continue current medication regimen. Continue to Monitor.  , Urinary Retention,: Urology Following. Continue to Monitor.   Right Rib Pain, Post Fall : RX: X-ray   Acute Exacerbation of Chronic Low Back Pain: RX: Lumbar X-ray. Continue to Monitor.  6. Fall subsequent encounter. Educated on [employer]. She and daughter verbalizes understanding.   F/U with Dr. Benjie Karvonen in 4-6 weeks

## 2022-11-10 ENCOUNTER — Telehealth: Payer: Self-pay | Admitting: Neurology

## 2022-11-10 ENCOUNTER — Encounter: Payer: Self-pay | Admitting: *Deleted

## 2022-11-10 ENCOUNTER — Encounter: Payer: Self-pay | Admitting: Registered Nurse

## 2022-11-10 ENCOUNTER — Ambulatory Visit
Admission: RE | Admit: 2022-11-10 | Discharge: 2022-11-10 | Disposition: A | Discharge status: Home / Self Care | Payer: Medicare Other | Source: Ambulatory Visit | Attending: Registered Nurse | Admitting: Registered Nurse

## 2022-11-10 ENCOUNTER — Telehealth: Payer: Self-pay | Admitting: Registered Nurse

## 2022-11-10 NOTE — Telephone Encounter (Signed)
Return [name] call,  She knows to direct all questions related to wound care to Dr Marcia Brash, she verbalizes understanding.

## 2022-11-10 NOTE — Telephone Encounter (Signed)
Opened in error

## 2022-11-10 NOTE — Telephone Encounter (Signed)
Returned your call. Please call her back.

## 2022-11-10 NOTE — Telephone Encounter (Signed)
Pt is requesting a refill for donepezil (ARICEPT) 10 MG tablet .  Pharmacy: [facility] [phone]

## 2022-11-10 NOTE — Telephone Encounter (Signed)
This provider was in contact with Delle Reining PA-C, regarding Ms. Baron Hamper question. About her Lumbar wound, They should direct all questions to Dr. Maurice Small.  No answer, left message to return the call.

## 2022-11-11 NOTE — Telephone Encounter (Signed)
Called and spoke to Patient who forgot she was supposed to stop taking it and stated she wont get a refill and will let us know if she has any issues or concerns.

## 2022-11-15 ENCOUNTER — Encounter: Payer: Self-pay | Admitting: Registered Nurse

## 2022-11-15 ENCOUNTER — Other Ambulatory Visit: Payer: Self-pay

## 2022-11-19 ENCOUNTER — Other Ambulatory Visit: Payer: Self-pay | Admitting: Neurological Surgery

## 2022-11-19 DIAGNOSIS — S32001D Stable burst fracture of unspecified lumbar vertebra, subsequent encounter for fracture with routine healing: Secondary | ICD-10-CM

## 2022-11-25 ENCOUNTER — Ambulatory Visit
Admission: RE | Admit: 2022-11-25 | Discharge: 2022-11-25 | Disposition: A | Discharge status: Home / Self Care | Payer: Medicare Other | Source: Ambulatory Visit | Attending: Neurological Surgery | Admitting: Neurological Surgery

## 2022-11-25 DIAGNOSIS — S32001D Stable burst fracture of unspecified lumbar vertebra, subsequent encounter for fracture with routine healing: Secondary | ICD-10-CM

## 2022-12-09 ENCOUNTER — Other Ambulatory Visit: Payer: Self-pay

## 2022-12-09 ENCOUNTER — Ambulatory Visit (INDEPENDENT_AMBULATORY_CARE_PROVIDER_SITE_OTHER): Payer: Medicare Other | Admitting: Infectious Disease

## 2022-12-09 ENCOUNTER — Encounter: Payer: Self-pay | Admitting: Infectious Disease

## 2022-12-09 VITALS — BP 103/79 | HR 92 | Temp 97.9°F | Resp 16 | Ht 61.0 in | Wt 139.0 lb

## 2022-12-09 DIAGNOSIS — M4646 Discitis, unspecified, lumbar region: Secondary | ICD-10-CM

## 2022-12-09 DIAGNOSIS — G0490 Encephalitis and encephalomyelitis, unspecified: Secondary | ICD-10-CM

## 2022-12-09 DIAGNOSIS — M8448XA Pathological fracture, other site, initial encounter for fracture: Secondary | ICD-10-CM

## 2022-12-09 DIAGNOSIS — T847XXD Infection and inflammatory reaction due to other internal orthopedic prosthetic devices, implants and grafts, subsequent encounter: Secondary | ICD-10-CM | POA: Diagnosis not present

## 2022-12-09 DIAGNOSIS — D72829 Elevated white blood cell count, unspecified: Secondary | ICD-10-CM | POA: Diagnosis not present

## 2022-12-09 DIAGNOSIS — B9689 Other specified bacterial agents as the cause of diseases classified elsewhere: Secondary | ICD-10-CM

## 2022-12-09 NOTE — Progress Notes (Signed)
Subjective:  Chief complaint: still with back pain, wound with tunnelling, recent sinus congestion, potentially some confusion   Patient ID: [name], female    DOB: 24-Aug-1955, 67 y.o.   MRN: [id number]  HPI   67 y.o. female with hx of scoliosis surgery with extensive lower thoracic and lumbar fusion roughly 36yrs ago, she suffered ground level fall in late November, where she fractures through all three columns of L3, thus went to OR on 12/9 for stabilization, new  posterolateral instrumentation fusion on June 26, 2022 complicated by Wound dehiscence with exposed hardware and unstable lumbar spine fracture status post revision of lumbar wound with removal of L1-3 4 and 5 lumbar screws and rod and removal of right L1 screw and revision of L1-L5 posterior instrumented fusion on August 09, 2022 and now concern for postoperative wound infection and ventriculitis. She had complicated protracted course in the icu, but eventually was discharged to CIR on vancomycin and cefepime through 3/14 for ventriculitis and hw complicating wound infection. Now changed to doxcycyline plus levofloxacin as of 3/15 while she continued to need medihoney with moistened quaze with sliver hydrofiber to the caudal portion of wound.   Mri of brain on 09/29/22- resolution of ventriculitis.   She was seen by my partner Dr. Drue Second while she was in the inpatient rehab unit.  Follow-up was rescheduled for May for this visit.  Back pain did not appear to be worse but wound does persist and apparently tunnels cranially.  Her white blood cell count with [facility] was in the 16,000 range an abrupt change from her last labs as an inpatient when it was in the 7000 range.  He did have a mechanical fall and fell on her left knee where she has a prosthetic joint.  When I last saw her we rechecked her inflammatory markers and sed rate had titrated down though CRP was going up to 15.6  WBC recheck with our lab  was 12,000.  In the interim she has been seen by Dr. Johnsie Cancel with neurosurgery and CT of the thoracic and lumbar spine were performed.  These have shown: A new T10 compression fracture with 12% loss of height with unchanged change adjacent chronic T10-T11 T12-L1 posterior element ankylosis with chronic nonfusion at T11-T12, L3 superior endplate nonacute fracture and subtle horizontal fracture of the chronic posterior element of the bone, difficult exclude nondisplaced S3-4 sacral fracture with mild presacral stranding.  Wound is still present but not draining purulent material.  There treating this with Aquacel and nurses measuring the circumference.  Apparently the patient has had some congestion and the breathing through her mouth more frequently.  Her daughter there was concern that this could be a harbinger of her infection becoming poorly controlled.  The patient also apparently has had some recent confusion as well.  Her pain in her back seems improved compared to last time I saw her certainly versus when she was in the hospital.    Past Medical History:  Diagnosis Date   Anemia    Anxiety    Arthritis    Bipolar disorder (HCC)    Depression    GERD (gastroesophageal reflux disease)    Heart murmur    "related to VSD"   High cholesterol    History of blood transfusion    "related to OR" (08/19/2016)   History of hiatal hernia    Hypertension    Hyperthyroidism    Mild cognitive impairment 09/06/2018   Paroxysmal ventricular tachycardia (  HCC)    Type II diabetes mellitus (HCC)    UTI (urinary tract infection)    being treated with Keflex   Ventricular septal defect    Ventriculitis of brain due to bacteria 11/02/2022    Past Surgical History:  Procedure Laterality Date   ABDOMINAL HYSTERECTOMY     BACK SURGERY     CARDIAC CATHETERIZATION N/A 06/23/2015   Procedure: Left Heart Cath and Coronary Angiography;  Surgeon: Laurey Morale, MD;  Location: [facility] INVASIVE CV  LAB;  Service: Cardiovascular;  Laterality: N/A;   CARDIAC CATHETERIZATION  1960   "VSD was so small; didn't need repaired"   EXAM UNDER ANESTHESIA WITH MANIPULATION OF HIP Right 06/02/2014   dr Jerl Santos   FRACTURE SURGERY     HERNIA REPAIR     HIP CLOSED REDUCTION Right 06/02/2014   Procedure: CLOSED MANIPULATION HIP;  Surgeon: Velna Ochs, MD;  Location: [facility] OR;  Service: Orthopedics;  Laterality: Right;   JOINT REPLACEMENT     JOINT REPLACEMENT     POSTERIOR LUMBAR FUSION 4 LEVEL N/A 06/26/2022   Procedure: Lumbar One To Lumbar Five Posterior Instrumented Fusion;  Surgeon: Jadene Pierini, MD;  Location: [facility] OR;  Service: Neurosurgery;  Laterality: N/A;   REFRACTIVE SURGERY Bilateral    SHOULDER ARTHROSCOPY Right    SHOULDER OPEN ROTATOR CUFF REPAIR Right    SPINAL FUSION  1996   "t10 down to my coccyx   SPINE HARDWARE REMOVAL     TOTAL ABDOMINAL HYSTERECTOMY     TOTAL HIP ARTHROPLASTY Right 05/10/2014   [facility]      by dr Cristal Deer olcott   TOTAL KNEE ARTHROPLASTY Left    TOTAL SHOULDER ARTHROPLASTY Left 08/19/2016   Procedure: TOTAL SHOULDER ARTHROPLASTY;  Surgeon: Jones Broom, MD;  Location: [facility] OR;  Service: Orthopedics;  Laterality: Left;  Left total shoulder replacement    Family History  Problem Relation Age of Onset   Other Mother        alive   Stroke Father 96       deceased   Dementia Father    Chorea Maternal Grandfather    Dementia Maternal Aunt    Dementia Maternal Aunt    Heart attack Other        multiple uncles have died with myocardial infarction      Social History   Socioeconomic History   Marital status: Married    Spouse name: Not on file   Number of children: Not on file   Years of education: Not on file   Highest education level: Master's degree (e.g., MA, MS, MEng, MEd, MSW, MBA)  Occupational History   Occupation: Magazine features editor: SELF-EMPLOYED    Comment: former  Tobacco Use   Smoking status: Former    Years: 50     Types: Cigarettes    Passive exposure: Never   Smokeless tobacco: Never   Tobacco comments:    03/30/21 smokes 2 cigs daily.      08/06/22 Shanda Bumps stated that the patient is not smoking while at [facility] for rehab. [facility]  Vaping Use   Vaping Use: Never used  Substance and Sexual Activity   Alcohol use: No   Drug use: No   Sexual activity: Not Currently    Birth control/protection: Surgical    Comment: Hysterectomy  Other Topics Concern   Not on file  Social History Narrative   Right handed   Caffeine 2-3 cups daily    Lives  at home with husband    Social Determinants of Health   Financial Resource Strain: Not on file  Food Insecurity: No Food Insecurity (06/27/2022)   Hunger Vital Sign    Worried About Running Out of Food in the Last Year: Never true    Ran Out of Food in the Last Year: Never true  Transportation Needs: No Transportation Needs (06/27/2022)   PRAPARE - Administrator, Civil Service (Medical): No    Lack of Transportation (Non-Medical): No  Physical Activity: Not on file  Stress: Not on file  Social Connections: Not on file    No Known Allergies   Current Outpatient Medications:    acetaminophen (TYLENOL) 325 MG tablet, Take 1-2 tablets (325-650 mg total) by mouth every 6 (six) hours as needed for mild pain or headache., Disp: , Rfl:    buprenorphine (BUTRANS) 5 MCG/HR PTWK, Place 1 patch onto the skin once a week., Disp: 4 patch, Rfl: 0   buPROPion (WELLBUTRIN XL) 150 MG 24 hr tablet, Take 1 tablet (150 mg total) by mouth daily., Disp: 30 tablet, Rfl: 0   carvedilol (COREG) 25 MG tablet, Take 1 tablet (25 mg total) by mouth 2 (two) times daily with a meal., Disp: 60 tablet, Rfl: 0   collagenase (SANTYL) 250 UNIT/GM ointment, Apply 1 Application topically daily. Apply 2-3 grams to affected area daily., Disp: 30 g, Rfl: 0   cyclobenzaprine (FLEXERIL) 10 MG tablet, Take 1 tablet (10 mg total) by mouth 3 (three) times daily., Disp: 90 tablet,  Rfl: 0   diclofenac Sodium (VOLTAREN) 1 % GEL, Apply 2 g topically 4 (four) times daily., Disp: 350 g, Rfl: 0   diphenoxylate-atropine (LOMOTIL) 2.5-0.025 MG tablet, Take 1 tablet by mouth 2 (two) times daily as needed for diarrhea or loose stools., Disp: 30 tablet, Rfl: 0   doxycycline (VIBRA-TABS) 100 MG tablet, Take 1 tablet (100 mg total) by mouth every 12 (twelve) hours., Disp: 60 tablet, Rfl: 4   famotidine (PEPCID) 20 MG tablet, Take 1 tablet (20 mg total) by mouth 2 (two) times daily., Disp: 60 tablet, Rfl: 0   fiber (NUTRISOURCE FIBER) PACK packet, Take 1 packet by mouth 2 (two) times daily., Disp: , Rfl:    fluvoxaMINE (LUVOX) 100 MG tablet, Take 1 tablet (100 mg total) by mouth at bedtime., Disp: 30 tablet, Rfl: 0   furosemide (LASIX) 20 MG tablet, Take 1 tablet (20 mg total) by mouth daily., Disp: 30 tablet, Rfl: 0   gabapentin (NEURONTIN) 100 MG capsule, Take 1 capsule (100 mg total) by mouth 3 (three) times daily., Disp: 90 capsule, Rfl: 0   Lactobacillus (ACIDOPHILUS) 100 MG CAPS, Take 1 capsule (100 mg total) by mouth 2 (two) times daily., Disp: 60 capsule, Rfl: 0   lamoTRIgine (LAMICTAL) 200 MG tablet, Take 1 tablet (200 mg total) by mouth daily., Disp: 30 tablet, Rfl: 0   levofloxacin (LEVAQUIN) 750 MG tablet, Take 1 tablet (750 mg total) by mouth daily., Disp: 30 tablet, Rfl: 4   levothyroxine (SYNTHROID) 88 MCG tablet, Take 1 tablet (88 mcg total) by mouth daily before breakfast., Disp: 30 tablet, Rfl: 0   lidocaine (LIDODERM) 5 %, Place 2 patches onto the skin daily. Remove & Discard patch within 12 hours or as directed by MD, Disp: 60 patch, Rfl: 0   losartan (COZAAR) 25 MG tablet, Take 1 tablet (25 mg total) by mouth at bedtime., Disp: 30 tablet, Rfl: 0   melatonin 5 MG TABS, Take 1  tablet (5 mg total) by mouth at bedtime as needed., Disp: 30 tablet, Rfl: 0   metFORMIN (GLUCOPHAGE) 500 MG tablet, Take 1 tablet (500 mg total) by mouth 2 (two) times daily with a meal., Disp: 60  tablet, Rfl: 0   Multiple Vitamin (MULTIVITAMIN) tablet, Take 1 tablet by mouth daily.  , Disp: , Rfl:    ondansetron (ZOFRAN) 4 MG tablet, Take 1 tablet (4 mg total) by mouth every 8 (eight) hours as needed for refractory nausea / vomiting., Disp: 20 tablet, Rfl: 0   oxyCODONE-acetaminophen (PERCOCET/ROXICET) 5-325 MG tablet, Take 0.5 tablets by mouth 4 (four) times daily -  with meals and at bedtime., Disp: 60 tablet, Rfl: 0   potassium chloride (KLOR-CON M) 10 MEQ tablet, Take 3 tablets (30 mEq total) by mouth 2 (two) times daily., Disp: 120 tablet, Rfl: 0   sodium chloride 1 g tablet, Take 1 tablet (1 g total) by mouth 2 (two) times daily with a meal., Disp: 60 tablet, Rfl: 0   tamsulosin (FLOMAX) 0.4 MG CAPS capsule, Take 2 capsules (0.8 mg total) by mouth daily after supper., Disp: 60 capsule, Rfl: 0   traZODone (DESYREL) 100 MG tablet, Take 1 tablet (100 mg total) by mouth at bedtime., Disp: 30 tablet, Rfl: 0   Review of Systems  Constitutional:  Negative for activity change, appetite change, chills, diaphoresis, fatigue, fever and unexpected weight change.  HENT:  Negative for congestion, rhinorrhea, sinus pressure, sneezing, sore throat and trouble swallowing.   Eyes:  Negative for photophobia and visual disturbance.  Respiratory:  Negative for cough, chest tightness, shortness of breath, wheezing and stridor.   Cardiovascular:  Negative for chest pain, palpitations and leg swelling.  Gastrointestinal:  Negative for abdominal distention, abdominal pain, anal bleeding, blood in stool, constipation, diarrhea, nausea and vomiting.  Genitourinary:  Negative for difficulty urinating, dysuria, flank pain and hematuria.  Musculoskeletal:  Positive for back pain. Negative for arthralgias, gait problem, joint swelling and myalgias.  Skin:  Negative for color change, pallor, rash and wound.  Neurological:  Negative for dizziness, tremors, weakness and light-headedness.  Hematological:  Negative  for adenopathy. Does not bruise/bleed easily.  Psychiatric/Behavioral:  Negative for agitation, behavioral problems, confusion, decreased concentration, dysphoric mood and sleep disturbance.        Objective:   Physical Exam Constitutional:      General: She is not in acute distress.    Appearance: Normal appearance. She is well-developed. She is not ill-appearing or diaphoretic.  HENT:     Head: Normocephalic and atraumatic.     Right Ear: Hearing and external ear normal.     Left Ear: Hearing and external ear normal.     Nose: No nasal deformity or rhinorrhea.  Eyes:     General: No scleral icterus.    Conjunctiva/sclera: Conjunctivae normal.     Right eye: Right conjunctiva is not injected.     Left eye: Left conjunctiva is not injected.     Pupils: Pupils are equal, round, and reactive to light.  Neck:     Vascular: No JVD.  Cardiovascular:     Rate and Rhythm: Normal rate and regular rhythm.     Heart sounds: S1 normal and S2 normal.  Pulmonary:     Effort: Pulmonary effort is normal. No respiratory distress.     Breath sounds: No wheezing.  Abdominal:     General: There is no distension.     Palpations: Abdomen is soft.  Musculoskeletal:  Right shoulder: Normal.     Left shoulder: Normal.     Cervical back: Normal range of motion and neck supple.     Right hip: Normal.     Left hip: Normal.     Right knee: Normal.     Left knee: Normal.  Lymphadenopathy:     Head:     Right side of head: No submandibular, preauricular or posterior auricular adenopathy.     Left side of head: No submandibular, preauricular or posterior auricular adenopathy.     Cervical:     Right cervical: No superficial or deep cervical adenopathy.    Left cervical: No superficial or deep cervical adenopathy.  Skin:    General: Skin is warm and dry.     Coloration: Skin is not pale.     Findings: No abrasion, bruising, ecchymosis, erythema, lesion or rash.     Nails: There is no clubbing.   Neurological:     General: No focal deficit present.     Mental Status: She is alert and oriented to person, place, and time.  Psychiatric:        Attention and Perception: She is attentive.        Mood and Affect: Mood normal.        Speech: Speech normal.        Behavior: Behavior normal. Behavior is cooperative.        Thought Content: Thought content normal.        Judgment: Judgment normal.    Wound 11/02/2022:     Wound 12/09/2022:       Assessment & Plan:    67 y.o. female with recent vertebral fracture status post hardware placement which was then exposed removed with revision complicated by CSF leak then admitted with fevers found to have postoperative fluid collection multifocal pneumonia thought to be due to aspiration and possible meningitis and ventricultis   Concern for lumbar wound with infection involving hardware:  Will recheck sed rate CRP CBC with differential and BMP with GFR.  Will continue doxycycline and levofloxacin for now.   I have scheduled her to again in June.  I am not enthusiastic about her being on chronic fluoroquinolones so we give her more protracted antibiotics I would like to exchange the ciprofloxacin for cefdinir.  For now though I want to keep the antibiotics with ER given the concern about infection not being well-controlled at the current site.  I do need for her to continue to follow-up with Dr. Johnsie Cancel.  ? Confusion: not clear cause, I certainly hope it is NOT a harbinger of infection being poorly controlled. There are Anshul confounders  Sinus congestion: This seems to be what is causing her to breathe through mouth more frequently she does not appear to have any respiratory distress  today   I have personally spent 40  minutes involved in face-to-face and non-face-to-face activities for this patient on the day of the visit. Professional time spent includes the following activities: Preparing to see the patient (review of  tests), Obtaining and/or reviewing separately obtained history (admission/discharge record), Performing a medically appropriate examination and/or evaluation , Ordering medications/tests/procedures, referring and communicating with other health care professionals, Documenting clinical information in the EMR, Independently interpreting results (not separately reported), Communicating results to the patient/family/caregiver, Counseling and educating the patient/family/caregiver and Care coordination (not separately reported).

## 2022-12-10 ENCOUNTER — Encounter: Payer: Medicare Other | Attending: Physical Medicine & Rehabilitation | Admitting: Physical Medicine & Rehabilitation

## 2022-12-10 ENCOUNTER — Encounter: Payer: Self-pay | Admitting: Physical Medicine & Rehabilitation

## 2022-12-10 ENCOUNTER — Telehealth: Payer: Self-pay

## 2022-12-10 VITALS — BP 92/61 | HR 86 | Ht 61.0 in | Wt 141.4 lb

## 2022-12-10 DIAGNOSIS — S21209S Unspecified open wound of unspecified back wall of thorax without penetration into thoracic cavity, sequela: Secondary | ICD-10-CM | POA: Insufficient documentation

## 2022-12-10 DIAGNOSIS — M4626 Osteomyelitis of vertebra, lumbar region: Secondary | ICD-10-CM | POA: Diagnosis not present

## 2022-12-10 DIAGNOSIS — R339 Retention of urine, unspecified: Secondary | ICD-10-CM | POA: Diagnosis not present

## 2022-12-10 DIAGNOSIS — S22070D Wedge compression fracture of T9-T10 vertebra, subsequent encounter for fracture with routine healing: Secondary | ICD-10-CM | POA: Diagnosis not present

## 2022-12-10 DIAGNOSIS — S21209D Unspecified open wound of unspecified back wall of thorax without penetration into thoracic cavity, subsequent encounter: Secondary | ICD-10-CM

## 2022-12-10 DIAGNOSIS — X58XXXS Exposure to other specified factors, sequela: Secondary | ICD-10-CM | POA: Insufficient documentation

## 2022-12-10 LAB — C-REACTIVE PROTEIN: CRP: 27.8 mg/L — ABNORMAL HIGH (ref <8.0)

## 2022-12-10 LAB — CBC WITH DIFFERENTIAL/PLATELET
Absolute Monocytes: 842 cells/uL (ref 200–950)
Basophils Absolute: 85 cells/uL (ref 0–200)
Basophils Relative: 0.7 %
Eosinophils Absolute: 549 cells/uL — ABNORMAL HIGH (ref 15–500)
Eosinophils Relative: 4.5 %
HCT: 36.2 % (ref 35.0–45.0)
Hemoglobin: 11.9 g/dL (ref 11.7–15.5)
Lymphs Abs: 1976 cells/uL (ref 850–3900)
MCH: 28.1 pg (ref 27.0–33.0)
MCHC: 32.9 g/dL (ref 32.0–36.0)
MCV: 85.6 fL (ref 80.0–100.0)
MPV: 8.3 fL (ref 7.5–12.5)
Monocytes Relative: 6.9 %
Neutro Abs: 8747 cells/uL — ABNORMAL HIGH (ref 1500–7800)
Neutrophils Relative %: 71.7 %
Platelets: 588 10*3/uL — ABNORMAL HIGH (ref 140–400)
RBC: 4.23 10*6/uL (ref 3.80–5.10)
RDW: 15.2 % — ABNORMAL HIGH (ref 11.0–15.0)
Total Lymphocyte: 16.2 %
WBC: 12.2 10*3/uL — ABNORMAL HIGH (ref 3.8–10.8)

## 2022-12-10 LAB — BASIC METABOLIC PANEL WITH GFR
BUN/Creatinine Ratio: 17 (calc) (ref 6–22)
BUN: 8 mg/dL (ref 7–25)
CO2: 21 mmol/L (ref 20–32)
Calcium: 9.2 mg/dL (ref 8.6–10.4)
Chloride: 99 mmol/L (ref 98–110)
Creat: 0.48 mg/dL — ABNORMAL LOW (ref 0.50–1.05)
Glucose, Bld: 145 mg/dL — ABNORMAL HIGH (ref 65–99)
Potassium: 5 mmol/L (ref 3.5–5.3)
Sodium: 131 mmol/L — ABNORMAL LOW (ref 135–146)
eGFR: 104 mL/min/{1.73_m2} (ref >=60)

## 2022-12-10 LAB — SEDIMENTATION RATE: Sed Rate: 28 mm/h (ref 0–30)

## 2022-12-10 NOTE — Telephone Encounter (Signed)
-----   Message from Randall Hiss, MD sent at 12/10/2022  8:44 AM EDT ----- Mixed results ESR down wihich is reassuring, wbc still high CRP still up. Would hold current course ----- Message ----- From: Interface, [facility] Results In Sent: 12/09/2022  10:53 PM EDT To: Randall Hiss, MD

## 2022-12-10 NOTE — Progress Notes (Signed)
Subjective:    Patient ID: [name], female    DOB: 28-Sep-1955, 67 y.o.   MRN: [id number]  HPI  Excerpt from discharge summary 10/13/2022 Brief HPI:   [name] is a 67 y.o. female with history of PVT, T2DM, bipolar d/o, mild cognitive impairments, right femur fracture with RLE leg length discrepancy and gait disorder, fall 05/2022 with 3 column unstable fracture through prior scoliosis construct s/p instrumentation with exposed hardware and wound dehiscence. [name] was admitted on 08/09/22 for revision of lumbar wound with removal of hardware and revision L1-L5 fusion by Dr. Maurice Small. Post op course was significant for delirium with fevers, PNA with respiratory failure requiring intubation 2/01. MRI brain showed evidence of ventriculitis and MRI spine showed widely patent fusion with decrease in bone marrow edema L3.    Dr. Daiva Eves recommended IV antibiotics X 6 weeks empirically with flagyl to cover aspiration PNA. [name] required tracheostomy as well as foley for urinary retention and rectal tube due to frequent loose stools and to prevent wound contamination. WOC was following for wound VAC changes. [name] was downsized to CFS#4 prior prior to d/c. PT/OT was working with patient who was limited by weakness, cognitive deficits requiring increased time for processing as well as verbal and tactile cues to follow simple commands as well as max to total assist with ADLs and mobility. CIR was recommended due to functional decline.       Hospital Course: [name] was admitted to rehab 09/03/2022 for inpatient therapies to consist of PT, ST and OT at least three hours five days a week. Past admission physiatrist, therapy team and rehab RN have worked together to provide customized collaborative inpatient rehab. Her blood pressures were monitored on TID basis and have been stable. As mentation and swallow function improved, diet was advanced to regular textures. [name] was tolerating PMSV  and was decannulated without difficulty. Her diabetes has been monitored with ac/hs CBG checks and was being managed with SSI due to variable intake and was reasonably controlled. As swallow function improved, [name] was advanced to regular textures, intake has improved with family providing food from home. [name] did request resumption of metformin and d/c of CBG checks. [name] is tolerating metformin without SE except for am nausea which could be due to transition to oral antibiotics. Patient has been educated on importance of BS management to promote wound healing and recommended to  monitor this post d/c. Her CBG checks d/c as BS controlled and have been monitored weekly with weekly labs.    [name] was maintained on Vanc/cefepime thru 03/14 and as follow up MRI brain 03/13 showed  resolution of ventriculitis, [name] was transitioned to Levaquin and doxycycline bid per ID input. [name] is to continue on oral antibiotics X 4 weeks and follow up at [facility] on 04/16 for final decision on duration of antibiotic treatment. WOC has been following to give input on patient's wound care and for monitoring. Wound VAC was discontinued on 02/21 and wet to dry dressing changes with Aquacel Ag was initiated. [name] did develop some slough on wound bed and metahoney added initially but changed to santyl which has helped to debride it. Patient did report that [name] plans  on showering at home and WOC recommended changing back to Aquacel for antibacterial protection.    Ensure and protein supplements added to help promote wound healing as as mentation improved but [name] had declined these frequently.  Voiding trial was attempted with increase in tamsulosin  as well as addition of urecholine. [name] continued to require I/O caths and patient elected on  foley with plans for voiding trial on outpatient basis.  [name] has had high levels of anxiety and ego support has been provided by rehab team. Her mentation has improved with cognition trending back to  baseline. [name] expressed concerns about her psychiatric medication regimen as multiple meds has been d/c while on acute and requested psychiatry input. Her home Wellbutrin was resumed on 03/21 and Dr. Viviano Simas who evaluated patient felt that patient was at her baseline and no additional meds were recommended.    Pain control is improving and [name] has been educated on importance of weaning narcotics gradually. [name] continues Butran's patch as well as low dose gabapentin as sedation was reported with attempts at up titration of latter.   [name] started making progress and LOS was extended by a week to help achieve higher goals. [name] requires min assist with ADL and mobility. [name] will continue to receive follow up HHPT, HHOT, HHST, HHaide and HHRN by [facility] after discharge.      Rehab course: During patient's stay in rehab weekly team conferences were held to monitor patient's progress, set goals and discuss barriers to discharge. At admission, patient required +2 total assist with ADL tasks and mod to total assist with pregait activity. [name] exhibited deficits in recall, attention, insight and required min to mod assist for processing and attention to tasks. [name]  has had improvement in activity tolerance, balance, postural control as well as ability to compensate for deficits. [name] has had improvement in functional use RUE and RLE as well as improvement in awareness. [name] requires min assist with ADL tasks.  [name] requires CGA for transfers and to ambulate 42 feet with RW and cues for sequencing.  [name] is able to complete functional cognitive tasks with min assist and requires min cues to self correct errors and for re-direction to tasks. Family education has been completed and family has hired caregivers to assist with care.    Interval history 12/10/2022 [name] is here for follow-up after CIR and above treatment.  [name] was also seen by [name] on 11/09/2022.  [name] reports [name] is continue to work  with physical therapy however [name] was discharged by OT.  [name] says after several weeks [name] may be able to transition to outpatient therapy.  Foley has been removed and [name] is voiding on her own, this does require to get up more frequently.  Her back pain is currently controlled with gabapentin, buprenorphine that was increased to 10 mcg/h patch and as needed oxycodone.  [name] has been seen by Dr. Daiva Eves infectious disease and is continued on antibiotics of doxycycline and Levaquin.  [name] continues to follow-up with Dr. Johnsie Cancel.  [name] had a CT of her T-spine and L-spine recently that showed a new compression fracture.   Pain Inventory Average Pain 5 Pain Right Now 5 My pain is burning, dull, and aching  In the last 24 hours, has pain interfered with the following? General activity 3 Relation with others 3 Enjoyment of life 4 What TIME of day is your pain at its worst? evening Sleep (in general) Poor  Pain is worse with: walking and some activites Pain improves with: medication Relief from Meds: 5  Family History  Problem Relation Age of Onset   Other Mother        alive   Stroke Father 20       deceased  Dementia Father    Chorea Maternal Grandfather    Dementia Maternal Aunt    Dementia Maternal Aunt    Heart attack Other        multiple uncles have died with myocardial infarction   Social History   Socioeconomic History   Marital status: Married    Spouse name: Not on file   Number of children: Not on file   Years of education: Not on file   Highest education level: Master's degree (e.g., MA, MS, MEng, MEd, MSW, MBA)  Occupational History   Occupation: Magazine features editor: SELF-EMPLOYED    Comment: former  Tobacco Use   Smoking status: Former    Years: 50    Types: Cigarettes    Passive exposure: Never   Smokeless tobacco: Never   Tobacco comments:    03/30/21 smokes 2 cigs daily.      08/06/22 Shanda Bumps stated that the patient is not smoking while at [facility]  for rehab. K[facility]  Vaping Use   Vaping Use: Never used  Substance and Sexual Activity   Alcohol use: No   Drug use: No   Sexual activity: Not Currently    Birth control/protection: Surgical    Comment: Hysterectomy  Other Topics Concern   Not on file  Social History Narrative   Right handed   Caffeine 2-3 cups daily    Lives at home with husband    Social Determinants of Health   Financial Resource Strain: Not on file  Food Insecurity: No Food Insecurity (06/27/2022)   Hunger Vital Sign    Worried About Running Out of Food in the Last Year: Never true    Ran Out of Food in the Last Year: Never true  Transportation Needs: No Transportation Needs (06/27/2022)   PRAPARE - Administrator, Civil Service (Medical): No    Lack of Transportation (Non-Medical): No  Physical Activity: Not on file  Stress: Not on file  Social Connections: Not on file   Past Surgical History:  Procedure Laterality Date   ABDOMINAL HYSTERECTOMY     BACK SURGERY     CARDIAC CATHETERIZATION N/A 06/23/2015   Procedure: Left Heart Cath and Coronary Angiography;  Surgeon: Laurey Morale, MD;  Location: [facility] INVASIVE CV LAB;  Service: Cardiovascular;  Laterality: N/A;   CARDIAC CATHETERIZATION  1960   "VSD was so small; didn't need repaired"   EXAM UNDER ANESTHESIA WITH MANIPULATION OF HIP Right 06/02/2014   dr Jerl Santos   FRACTURE SURGERY     HERNIA REPAIR     HIP CLOSED REDUCTION Right 06/02/2014   Procedure: CLOSED MANIPULATION HIP;  Surgeon: Velna Ochs, MD;  Location: [facility] OR;  Service: Orthopedics;  Laterality: Right;   JOINT REPLACEMENT     JOINT REPLACEMENT     POSTERIOR LUMBAR FUSION 4 LEVEL N/A 06/26/2022   Procedure: Lumbar One To Lumbar Five Posterior Instrumented Fusion;  Surgeon: Jadene Pierini, MD;  Location: [facility] OR;  Service: Neurosurgery;  Laterality: N/A;   REFRACTIVE SURGERY Bilateral    SHOULDER ARTHROSCOPY Right    SHOULDER OPEN ROTATOR CUFF REPAIR Right    SPINAL  FUSION  1996   "t10 down to my coccyx   SPINE HARDWARE REMOVAL     TOTAL ABDOMINAL HYSTERECTOMY     TOTAL HIP ARTHROPLASTY Right 05/10/2014   [facility]      by dr Cristal Deer olcott   TOTAL KNEE ARTHROPLASTY Left    TOTAL SHOULDER ARTHROPLASTY Left 08/19/2016  Procedure: TOTAL SHOULDER ARTHROPLASTY;  Surgeon: Jones Broom, MD;  Location: [facility] OR;  Service: Orthopedics;  Laterality: Left;  Left total shoulder replacement   Past Surgical History:  Procedure Laterality Date   ABDOMINAL HYSTERECTOMY     BACK SURGERY     CARDIAC CATHETERIZATION N/A 06/23/2015   Procedure: Left Heart Cath and Coronary Angiography;  Surgeon: Laurey Morale, MD;  Location: [facility] INVASIVE CV LAB;  Service: Cardiovascular;  Laterality: N/A;   CARDIAC CATHETERIZATION  1960   "VSD was so small; didn't need repaired"   EXAM UNDER ANESTHESIA WITH MANIPULATION OF HIP Right 06/02/2014   dr Jerl Santos   FRACTURE SURGERY     HERNIA REPAIR     HIP CLOSED REDUCTION Right 06/02/2014   Procedure: CLOSED MANIPULATION HIP;  Surgeon: Velna Ochs, MD;  Location: [facility] OR;  Service: Orthopedics;  Laterality: Right;   JOINT REPLACEMENT     JOINT REPLACEMENT     POSTERIOR LUMBAR FUSION 4 LEVEL N/A 06/26/2022   Procedure: Lumbar One To Lumbar Five Posterior Instrumented Fusion;  Surgeon: Jadene Pierini, MD;  Location: [facility] OR;  Service: Neurosurgery;  Laterality: N/A;   REFRACTIVE SURGERY Bilateral    SHOULDER ARTHROSCOPY Right    SHOULDER OPEN ROTATOR CUFF REPAIR Right    SPINAL FUSION  1996   "t10 down to my coccyx   SPINE HARDWARE REMOVAL     TOTAL ABDOMINAL HYSTERECTOMY     TOTAL HIP ARTHROPLASTY Right 05/10/2014   [facility]      by dr Cristal Deer olcott   TOTAL KNEE ARTHROPLASTY Left    TOTAL SHOULDER ARTHROPLASTY Left 08/19/2016   Procedure: TOTAL SHOULDER ARTHROPLASTY;  Surgeon: Jones Broom, MD;  Location: [facility] OR;  Service: Orthopedics;  Laterality: Left;  Left total shoulder replacement   Past Medical  History:  Diagnosis Date   Anemia    Anxiety    Arthritis    Bipolar disorder (HCC)    Depression    GERD (gastroesophageal reflux disease)    Heart murmur    "related to VSD"   High cholesterol    History of blood transfusion    "related to OR" (08/19/2016)   History of hiatal hernia    Hypertension    Hyperthyroidism    Mild cognitive impairment 09/06/2018   Paroxysmal ventricular tachycardia (HCC)    Type II diabetes mellitus (HCC)    UTI (urinary tract infection)    being treated with Keflex   Ventricular septal defect    Ventriculitis of brain due to bacteria 11/02/2022   Ht 5\' 1"  (1.549 m)   Wt 141 lb 6.4 oz (64.1 kg)   BMI 26.72 kg/m   Opioid Risk Score:   Fall Risk Score:  `1  Depression screen [facility] 2/9     12/10/2022   11:13 AM 12/09/2022   10:42 AM 11/02/2022    3:02 PM  Depression screen PHQ 2/9  Decreased Interest 0 0 0  Down, Depressed, Hopeless 0 0 0  PHQ - 2 Score 0 0 0      Review of Systems  Musculoskeletal:  Positive for back pain.  All other systems reviewed and are negative.     Objective:   Physical Exam  Gen: no distress, normal appearing HEENT: oral mucosa pink and moist, NCAT Cardio: Reg rate Chest: normal effort, normal rate of breathing Abd: soft, non-distended Ext: no edema Psych: pleasant, normal affect Skin: Sacral wound appears superficially similar in size to prior, wound track not explored for depth Neuro:  Alert and awake, follows commands, cranial 2-12 grossly intact No focal motor deficits noted Sensation intact light touch in all 4 extremities Musculoskeletal: No focal motor deficits noted Lumbar paraspinal tenderness present, lower thoracic paraspinal tenderness present  CT lumbar spine 12/16/22 IMPRESSION: 1. Pronounced chronic osteopenia. Prior decompression and fusion throughout the lumbar spine.   2. L3 superior endplate/superior body non-acute fracture AND subtle associated horizontal fracture through the  chronic posterior element bone mass there (series 9, image 53 today). But no displacement over this series of exams.   3. Difficult to exclude nondisplaced S3/S4 sacral fracture, with mild presacral stranding there.   4. But no other acute osseous abnormality identified. And elsewhere the chronic T12 through sacral ankylosis/arthrodesis appears intact.   5. T10 compression fracture is detailed separately today on CT Thoracic Spine.  We discussed getting stay CT thoracic spine Dec 16, 2022 IMPRESSION: 1. T10 compression fracture with 12% loss of height is new since 08/19/2022. No retropulsion or other complicating features. Unchanged adjacent chronic T10-T11 and T12-L1 posterior element ankylosis, with chronic non fusion of T11-T12. 2. No other acute osseous abnormality identified in the thoracic spine. Generalized osteopenia. Chronic mild T5, T6, and T11 compression fractures. 3. Increased elevation of the left hemidiaphragm and new left lung base consolidation since February. But small bilateral pleural effusions have resolved. 4. No CT evidence of thoracic spinal stenosis.      Assessment & Plan:   Osteomyelitis, infection of lumbar surgical site complicated by meningitis/ventriculitis.   -Continue to follow-up with Dr. Johnsie Cancel   -Continue to follow-up with infectious disease, was seen yesterday.  Continue antibiotics per ID  -[name] request to be seen by wound care clinic at [facility] to see if they have any options to help with her wound.  Will place consult per their request  -Continue home PT, OT has been discontinued.  [name] still reports difficulty getting up to use the restroom at night  Spinal compression fracture with lower back pain  -[name] reports this is controlled with current medications of buprenorphine, oxycodone, gabapentin ordered by Dr. Johnsie Cancel  Urinary retention  -Foley has been removed and [name] reports [name] is voiding without difficulty.  Follow-up with urology if  symptoms worsen  -

## 2022-12-24 ENCOUNTER — Encounter (HOSPITAL_BASED_OUTPATIENT_CLINIC_OR_DEPARTMENT_OTHER): Payer: Medicare Other | Admitting: Internal Medicine

## 2023-01-06 ENCOUNTER — Encounter: Payer: Self-pay | Admitting: Infectious Disease

## 2023-01-06 ENCOUNTER — Ambulatory Visit (INDEPENDENT_AMBULATORY_CARE_PROVIDER_SITE_OTHER): Payer: Medicare Other | Admitting: Infectious Disease

## 2023-01-06 ENCOUNTER — Other Ambulatory Visit: Payer: Self-pay

## 2023-01-06 VITALS — BP 114/76 | HR 87 | Temp 98.1°F | Resp 16

## 2023-01-06 DIAGNOSIS — M8008XD Age-related osteoporosis with current pathological fracture, vertebra(e), subsequent encounter for fracture with routine healing: Secondary | ICD-10-CM

## 2023-01-06 DIAGNOSIS — B9689 Other specified bacterial agents as the cause of diseases classified elsewhere: Secondary | ICD-10-CM

## 2023-01-06 DIAGNOSIS — T847XXS Infection and inflammatory reaction due to other internal orthopedic prosthetic devices, implants and grafts, sequela: Secondary | ICD-10-CM

## 2023-01-06 DIAGNOSIS — M8008XS Age-related osteoporosis with current pathological fracture, vertebra(e), sequela: Secondary | ICD-10-CM

## 2023-01-06 DIAGNOSIS — T148XXA Other injury of unspecified body region, initial encounter: Secondary | ICD-10-CM | POA: Diagnosis not present

## 2023-01-06 DIAGNOSIS — G0490 Encephalitis and encephalomyelitis, unspecified: Secondary | ICD-10-CM | POA: Diagnosis not present

## 2023-01-06 DIAGNOSIS — E119 Type 2 diabetes mellitus without complications: Secondary | ICD-10-CM | POA: Diagnosis not present

## 2023-01-06 DIAGNOSIS — M8008XA Age-related osteoporosis with current pathological fracture, vertebra(e), initial encounter for fracture: Secondary | ICD-10-CM

## 2023-01-06 DIAGNOSIS — T847XXD Infection and inflammatory reaction due to other internal orthopedic prosthetic devices, implants and grafts, subsequent encounter: Secondary | ICD-10-CM

## 2023-01-06 DIAGNOSIS — G009 Bacterial meningitis, unspecified: Secondary | ICD-10-CM

## 2023-01-06 HISTORY — DX: Age-related osteoporosis with current pathological fracture, vertebra(e), initial encounter for fracture: M80.08XA

## 2023-01-06 LAB — CBC WITH DIFFERENTIAL/PLATELET
Absolute Monocytes: 659 cells/uL (ref 200–950)
Basophils Absolute: 82 cells/uL (ref 0–200)
HCT: 35.9 % (ref 35.0–45.0)
MCHC: 33.4 g/dL (ref 32.0–36.0)
MPV: 8.2 fL (ref 7.5–12.5)

## 2023-01-06 LAB — BASIC METABOLIC PANEL WITH GFR
Chloride: 103 mmol/L (ref 98–110)
Potassium: 4.6 mmol/L (ref 3.5–5.3)
Sodium: 136 mmol/L (ref 135–146)
eGFR: 101 mL/min/{1.73_m2} (ref >=60)

## 2023-01-06 LAB — SEDIMENTATION RATE: Sed Rate: 33 mm/h — ABNORMAL HIGH (ref 0–30)

## 2023-01-06 MED ORDER — CEFDINIR 300 MG PO CAPS: 300.0000 mg | ORAL_CAPSULE | Freq: Two times a day (BID) | ORAL | 5 refills | Status: DC

## 2023-01-06 NOTE — Progress Notes (Signed)
Subjective:  Chief complaint: For follow-up for chronic wound having had a postoperative surgery complicated by wound infection with CSF leak and ventriculitis with concern for hardware potentially being involved   Patient ID: [name], female    DOB: May 08, 1956, 67 y.o.   MRN: [id number]  HPI   67 y.o. female with hx of scoliosis surgery with extensive lower thoracic and lumbar fusion roughly 11yrs ago, she suffered ground level fall in late November, where she fractures through all three columns of L3, thus went to OR on 12/9 for stabilization, new  posterolateral instrumentation fusion on June 26, 2022 complicated by Wound dehiscence with exposed hardware and unstable lumbar spine fracture status post revision of lumbar wound with removal of L1-3 4 and 5 lumbar screws and rod and removal of right L1 screw and revision of L1-L5 posterior instrumented fusion on August 09, 2022 and now concern for postoperative wound infection and ventriculitis. She had complicated protracted course in the icu, but eventually was discharged to CIR on vancomycin and cefepime through 3/14 for ventriculitis and hw complicating wound infection. Now changed to doxcycyline plus levofloxacin as of 3/15 while she continued to need medihoney with moistened quaze with sliver hydrofiber to the caudal portion of wound.   Mri of brain on 09/29/22- resolution of ventriculitis.   She was seen by my partner Dr. Drue Second while she was in the inpatient rehab unit.  Follow-up was rescheduled for May for this visit.  Back pain did not appear to be worse but wound does persist and apparently tunnels cranially.  Her white blood cell count with [facility] was in the 16,000 range an abrupt change from her last labs as an inpatient when it was in the 7000 range.  He did have a mechanical fall and fell on her left knee where she has a prosthetic joint.  When I last saw her we rechecked her inflammatory markers and sed  rate had titrated down though CRP was going up to 15.6  WBC recheck with our lab was 12,000.  In the interim she has been seen by Dr. Johnsie Cancel with neurosurgery and CT of the thoracic and lumbar spine were performed.  These have shown: A new T10 compression fracture with 12% loss of height with unchanged change adjacent chronic T10-T11 T12-L1 posterior element ankylosis with chronic nonfusion at T11-T12, L3 superior endplate nonacute fracture and subtle horizontal fracture of the chronic posterior element of the bone, difficult exclude nondisplaced S3-4 sacral fracture with mild presacral stranding.  Wound was still present at last visit but not draining purulent material.  There treating this with Aquacel and nurses measuring the circumference.  Checked labs last time we saw her white blood cell count remained elevated 12,000.  Sed rate and CRP [place] sed rate actually normalized and CRP had gone up.  We have been continue on levofloxacin and doxycycline.  He seems to be doing better wound is still present but seems stable.  She is still bothered by her white blood cell count having been elevated and we are going to repeat labs again today.     Past Medical History:  Diagnosis Date   Anemia    Anxiety    Arthritis    Bipolar disorder (HCC)    Depression    GERD (gastroesophageal reflux disease)    Heart murmur    "related to VSD"   High cholesterol    History of blood transfusion    "related to OR" (08/19/2016)  History of hiatal hernia    Hypertension    Hyperthyroidism    Mild cognitive impairment 09/06/2018   Paroxysmal ventricular tachycardia (HCC)    Type II diabetes mellitus (HCC)    UTI (urinary tract infection)    being treated with Keflex   Ventricular septal defect    Ventriculitis of brain due to bacteria 11/02/2022    Past Surgical History:  Procedure Laterality Date   ABDOMINAL HYSTERECTOMY     BACK SURGERY     CARDIAC CATHETERIZATION N/A  06/23/2015   Procedure: Left Heart Cath and Coronary Angiography;  Surgeon: Laurey Morale, MD;  Location: [facility] INVASIVE CV LAB;  Service: Cardiovascular;  Laterality: N/A;   CARDIAC CATHETERIZATION  1960   "VSD was so small; didn't need repaired"   EXAM UNDER ANESTHESIA WITH MANIPULATION OF HIP Right 06/02/2014   dr Jerl Santos   FRACTURE SURGERY     HERNIA REPAIR     HIP CLOSED REDUCTION Right 06/02/2014   Procedure: CLOSED MANIPULATION HIP;  Surgeon: Velna Ochs, MD;  Location: [facility] OR;  Service: Orthopedics;  Laterality: Right;   JOINT REPLACEMENT     JOINT REPLACEMENT     POSTERIOR LUMBAR FUSION 4 LEVEL N/A 06/26/2022   Procedure: Lumbar One To Lumbar Five Posterior Instrumented Fusion;  Surgeon: Jadene Pierini, MD;  Location: [facility] OR;  Service: Neurosurgery;  Laterality: N/A;   REFRACTIVE SURGERY Bilateral    SHOULDER ARTHROSCOPY Right    SHOULDER OPEN ROTATOR CUFF REPAIR Right    SPINAL FUSION  1996   "t10 down to my coccyx   SPINE HARDWARE REMOVAL     TOTAL ABDOMINAL HYSTERECTOMY     TOTAL HIP ARTHROPLASTY Right 05/10/2014   [facility]      by dr Cristal Deer olcott   TOTAL KNEE ARTHROPLASTY Left    TOTAL SHOULDER ARTHROPLASTY Left 08/19/2016   Procedure: TOTAL SHOULDER ARTHROPLASTY;  Surgeon: Jones Broom, MD;  Location: [facility] OR;  Service: Orthopedics;  Laterality: Left;  Left total shoulder replacement    Family History  Problem Relation Age of Onset   Other Mother        alive   Stroke Father 56       deceased   Dementia Father    Chorea Maternal Grandfather    Dementia Maternal Aunt    Dementia Maternal Aunt    Heart attack Other        multiple uncles have died with myocardial infarction      Social History   Socioeconomic History   Marital status: Married    Spouse name: Not on file   Number of children: Not on file   Years of education: Not on file   Highest education level: Master's degree (e.g., MA, MS, MEng, MEd, MSW, MBA)  Occupational History    Occupation: Magazine features editor: SELF-EMPLOYED    Comment: former  Tobacco Use   Smoking status: Former    Years: 50    Types: Cigarettes    Passive exposure: Never   Smokeless tobacco: Never   Tobacco comments:    03/30/21 smokes 2 cigs daily.      08/06/22 Shanda Bumps stated that the patient is not smoking while at [facility] for rehab. [facility]  Vaping Use   Vaping Use: Never used  Substance and Sexual Activity   Alcohol use: No   Drug use: No   Sexual activity: Not Currently    Birth control/protection: Surgical    Comment: Hysterectomy  Other Topics  Concern   Not on file  Social History Narrative   Right handed   Caffeine 2-3 cups daily    Lives at home with husband    Social Determinants of Health   Financial Resource Strain: Not on file  Food Insecurity: No Food Insecurity (06/27/2022)   Hunger Vital Sign    Worried About Running Out of Food in the Last Year: Never true    Ran Out of Food in the Last Year: Never true  Transportation Needs: No Transportation Needs (06/27/2022)   PRAPARE - Administrator, Civil Service (Medical): No    Lack of Transportation (Non-Medical): No  Physical Activity: Not on file  Stress: Not on file  Social Connections: Not on file    No Known Allergies   Current Outpatient Medications:    acetaminophen (TYLENOL) 325 MG tablet, Take 1-2 tablets (325-650 mg total) by mouth every 6 (six) hours as needed for mild pain or headache., Disp: , Rfl:    buprenorphine (BUTRANS) 5 MCG/HR PTWK, Place 1 patch onto the skin once a week., Disp: 4 patch, Rfl: 0   buPROPion (WELLBUTRIN XL) 150 MG 24 hr tablet, Take 1 tablet (150 mg total) by mouth daily., Disp: 30 tablet, Rfl: 0   carvedilol (COREG) 25 MG tablet, Take 1 tablet (25 mg total) by mouth 2 (two) times daily with a meal., Disp: 60 tablet, Rfl: 0   collagenase (SANTYL) 250 UNIT/GM ointment, Apply 1 Application topically daily. Apply 2-3 grams to affected area daily., Disp: 30 g, Rfl:  0   cyclobenzaprine (FLEXERIL) 10 MG tablet, Take 1 tablet (10 mg total) by mouth 3 (three) times daily., Disp: 90 tablet, Rfl: 0   diclofenac Sodium (VOLTAREN) 1 % GEL, Apply 2 g topically 4 (four) times daily., Disp: 350 g, Rfl: 0   diphenoxylate-atropine (LOMOTIL) 2.5-0.025 MG tablet, Take 1 tablet by mouth 2 (two) times daily as needed for diarrhea or loose stools., Disp: 30 tablet, Rfl: 0   doxycycline (VIBRA-TABS) 100 MG tablet, Take 1 tablet (100 mg total) by mouth every 12 (twelve) hours., Disp: 60 tablet, Rfl: 4   famotidine (PEPCID) 20 MG tablet, Take 1 tablet (20 mg total) by mouth 2 (two) times daily., Disp: 60 tablet, Rfl: 0   fiber (NUTRISOURCE FIBER) PACK packet, Take 1 packet by mouth 2 (two) times daily., Disp: , Rfl:    fluvoxaMINE (LUVOX) 100 MG tablet, Take 1 tablet (100 mg total) by mouth at bedtime., Disp: 30 tablet, Rfl: 0   furosemide (LASIX) 20 MG tablet, Take 1 tablet (20 mg total) by mouth daily., Disp: 30 tablet, Rfl: 0   gabapentin (NEURONTIN) 100 MG capsule, Take 1 capsule (100 mg total) by mouth 3 (three) times daily., Disp: 90 capsule, Rfl: 0   Lactobacillus (ACIDOPHILUS) 100 MG CAPS, Take 1 capsule (100 mg total) by mouth 2 (two) times daily., Disp: 60 capsule, Rfl: 0   lamoTRIgine (LAMICTAL) 200 MG tablet, Take 1 tablet (200 mg total) by mouth daily., Disp: 30 tablet, Rfl: 0   levofloxacin (LEVAQUIN) 750 MG tablet, Take 1 tablet (750 mg total) by mouth daily., Disp: 30 tablet, Rfl: 4   levothyroxine (SYNTHROID) 88 MCG tablet, Take 1 tablet (88 mcg total) by mouth daily before breakfast., Disp: 30 tablet, Rfl: 0   lidocaine (LIDODERM) 5 %, Place 2 patches onto the skin daily. Remove & Discard patch within 12 hours or as directed by MD, Disp: 60 patch, Rfl: 0   losartan (COZAAR) 25 MG  tablet, Take 1 tablet (25 mg total) by mouth at bedtime., Disp: 30 tablet, Rfl: 0   melatonin 5 MG TABS, Take 1 tablet (5 mg total) by mouth at bedtime as needed., Disp: 30 tablet, Rfl:  0   metFORMIN (GLUCOPHAGE) 500 MG tablet, Take 1 tablet (500 mg total) by mouth 2 (two) times daily with a meal., Disp: 60 tablet, Rfl: 0   Multiple Vitamin (MULTIVITAMIN) tablet, Take 1 tablet by mouth daily.  , Disp: , Rfl:    ondansetron (ZOFRAN) 4 MG tablet, Take 1 tablet (4 mg total) by mouth every 8 (eight) hours as needed for refractory nausea / vomiting., Disp: 20 tablet, Rfl: 0   oxyCODONE-acetaminophen (PERCOCET/ROXICET) 5-325 MG tablet, Take 0.5 tablets by mouth 4 (four) times daily -  with meals and at bedtime., Disp: 60 tablet, Rfl: 0   potassium chloride (KLOR-CON M) 10 MEQ tablet, Take 3 tablets (30 mEq total) by mouth 2 (two) times daily., Disp: 120 tablet, Rfl: 0   sodium chloride 1 g tablet, Take 1 tablet (1 g total) by mouth 2 (two) times daily with a meal., Disp: 60 tablet, Rfl: 0   tamsulosin (FLOMAX) 0.4 MG CAPS capsule, Take 2 capsules (0.8 mg total) by mouth daily after supper., Disp: 60 capsule, Rfl: 0   traZODone (DESYREL) 100 MG tablet, Take 1 tablet (100 mg total) by mouth at bedtime., Disp: 30 tablet, Rfl: 0   Review of Systems  Constitutional:  Negative for activity change, appetite change, chills, diaphoresis, fatigue, fever and unexpected weight change.  HENT:  Negative for congestion, rhinorrhea, sinus pressure, sneezing, sore throat and trouble swallowing.   Eyes:  Negative for photophobia and visual disturbance.  Respiratory:  Negative for cough, chest tightness, shortness of breath, wheezing and stridor.   Cardiovascular:  Negative for chest pain, palpitations and leg swelling.  Gastrointestinal:  Negative for abdominal distention, abdominal pain, anal bleeding, blood in stool, constipation, diarrhea, nausea and vomiting.  Genitourinary:  Negative for difficulty urinating, dysuria, flank pain and hematuria.  Musculoskeletal:  Negative for arthralgias, back pain, gait problem, joint swelling and myalgias.  Skin:  Positive for wound. Negative for color change,  pallor and rash.  Neurological:  Negative for dizziness, tremors, weakness and light-headedness.  Hematological:  Negative for adenopathy. Does not bruise/bleed easily.  Psychiatric/Behavioral:  Negative for agitation, behavioral problems, confusion, decreased concentration, dysphoric mood and sleep disturbance.        Objective:   Physical Exam Constitutional:      General: She is not in acute distress.    Appearance: Normal appearance. She is well-developed. She is not ill-appearing or diaphoretic.  HENT:     Head: Normocephalic and atraumatic.     Right Ear: Hearing and external ear normal.     Left Ear: Hearing and external ear normal.     Nose: No nasal deformity or rhinorrhea.  Eyes:     General: No scleral icterus.    Conjunctiva/sclera: Conjunctivae normal.     Right eye: Right conjunctiva is not injected.     Left eye: Left conjunctiva is not injected.     Pupils: Pupils are equal, round, and reactive to light.  Neck:     Vascular: No JVD.  Cardiovascular:     Rate and Rhythm: Normal rate and regular rhythm.     Heart sounds: Normal heart sounds, S1 normal and S2 normal. No murmur heard.    No friction rub.  Abdominal:     General: Bowel sounds are  normal. There is no distension.     Palpations: Abdomen is soft.     Tenderness: There is no abdominal tenderness.  Musculoskeletal:        General: Normal range of motion.     Right shoulder: Normal.     Left shoulder: Normal.     Cervical back: Normal range of motion and neck supple.     Right hip: Normal.     Left hip: Normal.     Right knee: Normal.     Left knee: Normal.  Lymphadenopathy:     Head:     Right side of head: No submandibular, preauricular or posterior auricular adenopathy.     Left side of head: No submandibular, preauricular or posterior auricular adenopathy.     Cervical: No cervical adenopathy.     Right cervical: No superficial or deep cervical adenopathy.    Left cervical: No superficial or  deep cervical adenopathy.  Skin:    General: Skin is warm and dry.     Coloration: Skin is not pale.     Findings: No abrasion, bruising, ecchymosis, erythema, lesion or rash.     Nails: There is no clubbing.  Neurological:     Mental Status: She is alert and oriented to person, place, and time.  Psychiatric:        Attention and Perception: She is attentive.        Mood and Affect: Mood normal.        Speech: Speech normal.        Behavior: Behavior normal. Behavior is cooperative.        Thought Content: Thought content normal.        Judgment: Judgment normal.    Wound 11/02/2022:     Wound 12/09/2022:    Wound 01/06/2023:      Assessment & Plan:    67 y.o. female with recent vertebral fracture status post hardware placement which was then exposed removed with revision complicated by CSF leak then admitted with fevers found to have postoperative fluid collection multifocal pneumonia thought to be due to aspiration and possible meningitis and ventricultis  Due to concern that the lumbar infection could it could involve hardware would like to give her more protracted antibiotics I would like to exchange levofloxacin for cefdinir 300 mg twice daily but continue doxycycline.  I will recheck a sed rate CRP CBC with differential and BMP with GFR.  She will continue to follow Dr. Johnsie Cancel      I have personally spent 26  minutes involved in face-to-face and non-face-to-face activities for this patient on the day of the visit. Professional time spent includes the following activities: Preparing to see the patient (review of tests), Obtaining and/or reviewing separately obtained history (admission/discharge record), Performing a medically appropriate examination and/or evaluation , Ordering medications/tests/procedures, referring and communicating with other health care professionals, Documenting clinical information in the EMR, Independently interpreting results (not separately  reported), Communicating results to the patient/family/caregiver, Counseling and educating the patient/family/caregiver and Care coordination (not separately reported).

## 2023-01-07 ENCOUNTER — Encounter (HOSPITAL_BASED_OUTPATIENT_CLINIC_OR_DEPARTMENT_OTHER): Payer: Medicare Other | Attending: Internal Medicine | Admitting: Internal Medicine

## 2023-01-07 DIAGNOSIS — S32009A Unspecified fracture of unspecified lumbar vertebra, initial encounter for closed fracture: Secondary | ICD-10-CM | POA: Diagnosis not present

## 2023-01-07 DIAGNOSIS — T8131XA Disruption of external operation (surgical) wound, not elsewhere classified, initial encounter: Secondary | ICD-10-CM

## 2023-01-07 DIAGNOSIS — E1151 Type 2 diabetes mellitus with diabetic peripheral angiopathy without gangrene: Secondary | ICD-10-CM | POA: Insufficient documentation

## 2023-01-07 DIAGNOSIS — Z87891 Personal history of nicotine dependence: Secondary | ICD-10-CM | POA: Insufficient documentation

## 2023-01-07 DIAGNOSIS — L98422 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: Secondary | ICD-10-CM | POA: Insufficient documentation

## 2023-01-07 DIAGNOSIS — E11622 Type 2 diabetes mellitus with other skin ulcer: Secondary | ICD-10-CM | POA: Insufficient documentation

## 2023-01-07 DIAGNOSIS — M1990 Unspecified osteoarthritis, unspecified site: Secondary | ICD-10-CM | POA: Diagnosis not present

## 2023-01-07 DIAGNOSIS — I10 Essential (primary) hypertension: Secondary | ICD-10-CM | POA: Insufficient documentation

## 2023-01-07 LAB — BASIC METABOLIC PANEL WITH GFR
BUN: 17 mg/dL (ref 7–25)
CO2: 21 mmol/L (ref 20–32)
Calcium: 9.4 mg/dL (ref 8.6–10.4)
Creat: 0.53 mg/dL (ref 0.50–1.05)
Glucose, Bld: 86 mg/dL (ref 65–99)

## 2023-01-07 LAB — CBC WITH DIFFERENTIAL/PLATELET
Basophils Relative: 0.8 %
Eosinophils Absolute: 453 cells/uL (ref 15–500)
Eosinophils Relative: 4.4 %
Hemoglobin: 12 g/dL (ref 11.7–15.5)
Lymphs Abs: 2204 cells/uL (ref 850–3900)
MCH: 28.5 pg (ref 27.0–33.0)
MCV: 85.3 fL (ref 80.0–100.0)
Monocytes Relative: 6.4 %
Neutro Abs: 6901 cells/uL (ref 1500–7800)
Neutrophils Relative %: 67 %
Platelets: 518 10*3/uL — ABNORMAL HIGH (ref 140–400)
RBC: 4.21 10*6/uL (ref 3.80–5.10)
RDW: 15.9 % — ABNORMAL HIGH (ref 11.0–15.0)
Total Lymphocyte: 21.4 %
WBC: 10.3 10*3/uL (ref 3.8–10.8)

## 2023-01-07 LAB — C-REACTIVE PROTEIN: CRP: 5.4 mg/L (ref <8.0)

## 2023-01-07 NOTE — Progress Notes (Signed)
ALTAIR, STANKO ([id number]) [id number] Page 1 of 8 Visit Report for 01/07/2023 Allergy List Details Patient Name: Date of Service: [name] 01/07/2023 9:30 A M Medical Record Number: [id number] Patient Account Number: [id number] Date of Birth/Sex: Treating RN: 09/17/1955 (67 y.o. Arta Silence Primary Care Aron Needles: Aliene Beams Other Clinician: Referring Marea Reasner: Treating Aydan Levitz/Extender: Stanford Breed, YURI Weeks in Treatment: 0 Allergies Active Allergies No Known Drug Allergies Allergy Notes Electronic Signature(s) Signed: 01/07/2023 2:31:37 PM By: Thayer Dallas Entered By: Thayer Dallas on 01/07/2023 10:11:22 -------------------------------------------------------------------------------- Arrival Information Details Patient Name: Date of Service: [name] 01/07/2023 9:30 A M Medical Record Number: [id number] Patient Account Number: [id number] Date of Birth/Sex: Treating RN: 05-29-1956 (67 y.o. F) Primary Care Hilery Wintle: Aliene Beams Other Clinician: Referring Rihanna Marseille: Treating Karmin Kasprzak/Extender: Alfonse Alpers Weeks in Treatment: 0 Visit Information Patient Arrived: Wheel Chair Arrival Time: 10:06 Accompanied By: husband and daughter Transfer Assistance: Manual Patient Identification Verified: Yes Secondary Verification Process Completed: Yes Patient Requires Transmission-Based Precautions: No Patient Has Alerts: No Electronic Signature(s) Signed: 01/07/2023 2:31:37 PM By: Thayer Dallas Entered By: Thayer Dallas on 01/07/2023 10:09:14 -------------------------------------------------------------------------------- Clinic Level of Care Assessment Details Patient Name: Date of Service: [name] 01/07/2023 9:30 A M Medical Record Number: [id number] Patient Account Number: [id number] [name], [name] ([id number]) [id number]  Page 2 of 8 Date of Birth/Sex: Treating RN: 1955/08/27 (67 y.o. Orville Govern Primary Care Dorinda Stehr: Aliene Beams Other Clinician: Referring Krystena Reitter: Treating Yazmyne Sara/Extender: Alfonse Alpers Weeks in Treatment: 0 Clinic Level of Care Assessment Items TOOL 1 Quantity Score X- 1 0 Use when EandM and Procedure is performed on INITIAL visit ASSESSMENTS - Nursing Assessment / Reassessment X- 1 20 General Physical Exam (combine w/ comprehensive assessment (listed just below) when performed on new pt. evals) X- 1 25 Comprehensive Assessment (HX, ROS, Risk Assessments, Wounds Hx, etc.) ASSESSMENTS - Wound and Skin Assessment / Reassessment []  - 0 Dermatologic / Skin Assessment (not related to wound area) ASSESSMENTS - Ostomy and/or Continence Assessment and Care []  - 0 Incontinence Assessment and Management []  - 0 Ostomy Care Assessment and Management (repouching, etc.) PROCESS - Coordination of Care X - Simple Patient / Family Education for ongoing care 1 15 []  - 0 Complex (extensive) Patient / Family Education for ongoing care X- 1 10 Staff obtains Chiropractor, Records, T Results / Process Orders est []  - 0 Staff telephones HHA, Nursing Homes / Clarify orders / etc []  - 0 Routine Transfer to another Facility (non-emergent condition) []  - 0 Routine Hospital Admission (non-emergent condition) X- 1 15 New Admissions / Manufacturing engineer / Ordering NPWT Apligraf, etc. , []  - 0 Emergency Hospital Admission (emergent condition) PROCESS - Special Needs []  - 0 Pediatric / Minor Patient Management []  - 0 Isolation Patient Management []  - 0 Hearing / Language / Visual special needs []  - 0 Assessment of Community assistance (transportation, D/C planning, etc.) []  - 0 Additional assistance / Altered mentation []  - 0 Support Surface(s) Assessment (bed, cushion, seat, etc.) INTERVENTIONS - Miscellaneous []  - 0 External ear exam []  - 0 Patient  Transfer (multiple staff / Nurse, adult / Similar devices) []  - 0 Simple Staple / Suture removal (25 or less) []  - 0 Complex Staple / Suture removal (26 or more) []  - 0 Hypo/Hyperglycemic Management (do not check if billed separately) []  - 0 Ankle / Brachial Index (ABI) - do not check if billed separately Has the  patient been seen at the hospital within the last three years: Yes Total Score: 85 Level Of Care: New/Established - Level 3 Electronic Signature(s) Signed: 01/07/2023 12:44:31 PM By: Redmond Pulling RN, BSN Entered By: Redmond Pulling on 01/07/2023 11:58:48 [name], [name] ([id number]) [id number] Page 3 of 8 -------------------------------------------------------------------------------- Encounter Discharge Information Details Patient Name: Date of Service: [name] 01/07/2023 9:30 A M Medical Record Number: [id number] Patient Account Number: [id number] Date of Birth/Sex: Treating RN: [birth year] (67 y.o. Orville Govern Primary Care Ellayna Hilligoss: Aliene Beams Other Clinician: Referring Maniya Donovan: Treating Emalee Knies/Extender: Alfonse Alpers Weeks in Treatment: 0 Encounter Discharge Information Items Discharge Condition: Stable Ambulatory Status: Wheelchair Discharge Destination: Home Transportation: Private Auto Accompanied By: husband Schedule Follow-up Appointment: Yes Clinical Summary of Care: Patient Declined Electronic Signature(s) Signed: 01/07/2023 12:44:31 PM By: Redmond Pulling RN, BSN Entered By: Redmond Pulling on 01/07/2023 12:00:03 -------------------------------------------------------------------------------- Lower Extremity Assessment Details Patient Name: Date of Service: [name] 01/07/2023 9:30 A M Medical Record Number: [id number] Patient Account Number: [id number] Date of Birth/Sex: Treating RN: [birth year] (67 y.o. F) Primary Care Tationna Fullard: Aliene Beams Other  Clinician: Referring Harvest Stanco: Treating Shaunessy Dobratz/Extender: Alfonse Alpers Weeks in Treatment: 0 Electronic Signature(s) Signed: 01/07/2023 2:31:37 PM By: Thayer Dallas Entered By: Thayer Dallas on 01/07/2023 10:36:10 -------------------------------------------------------------------------------- Multi Wound Chart Details Patient Name: Date of Service: [name] 01/07/2023 9:30 A M Medical Record Number: [id number] Patient Account Number: [id number] Date of Birth/Sex: Treating RN: [birth year] (67 y.o. F) Primary Care Clanton Emanuelson: Aliene Beams Other Clinician: Referring Ozzie Remmers: Treating Burgundy Matuszak/Extender: Alfonse Alpers Weeks in Treatment: 0 Vital Signs Height(in): 61 Capillary Blood Glucose(mg/dl): 102 Weight(lbs): 725 Pulse(bpm): 79 Body Mass Index(BMI): 26.3 Blood Pressure(mmHg): 102/68 Temperature(F): 98.5 Respiratory Rate(breaths/min): 18 [1:Photos:] [N/A:N/A] Back N/A N/A Wound Location: Surgical Injury N/A N/A Wounding Event: Open Surgical Wound N/A N/A Primary Etiology: Anemia, Hypertension, Type II N/A N/A Comorbid History: Diabetes, Osteoarthritis 08/11/2022 N/A N/A Date Acquired: 0 N/A N/A Weeks of Treatment: Open N/A N/A Wound Status: No N/A N/A Wound Recurrence: 1.9x1.3x0.5 N/A N/A Measurements L x W x D (cm) 1.94 N/A N/A A (cm) : rea 0.97 N/A N/A Volume (cm) : Full Thickness Without Exposed N/A N/A Classification: Support Structures Medium N/A N/A Exudate Amount: Serosanguineous N/A N/A Exudate Type: red, brown N/A N/A Exudate Color: Large (67-100%) N/A N/A Granulation Amount: Red N/A N/A Granulation Quality: Small (1-33%) N/A N/A Necrotic Amount: Fat Layer (Subcutaneous Tissue): Yes N/A N/A Exposed Structures: Fascia: No Tendon: No Muscle: No Joint: No Bone: No None N/A N/A Epithelialization: Chemical Cauterization N/A N/A Procedures Performed: Treatment Notes Electronic  Signature(s) Signed: 01/07/2023 1:00:24 PM By: Geralyn Corwin DO Entered By: Geralyn Corwin on 01/07/2023 11:46:19 -------------------------------------------------------------------------------- Multi-Disciplinary Care Plan Details Patient Name: Date of Service: [name] 01/07/2023 9:30 A M Medical Record Number: [id number] Patient Account Number: [id number] Date of Birth/Sex: Treating RN: 06-10-1956 (67 y.o. Orville Govern Primary Care Lashun Ramseyer: Aliene Beams Other Clinician: Referring Azeez Dunker: Treating Asier Desroches/Extender: Alfonse Alpers Weeks in Treatment: 0 Active Inactive Orientation to the Wound Care Program Nursing Diagnoses: Knowledge deficit related to the wound healing center program Goals: Patient/caregiver will verbalize understanding of the Wound Healing Center Program Date Initiated: 01/07/2023 Target Resolution Date: 01/21/2023 Goal Status: Active Interventions: Provide education on orientation to the wound center Notes: [address] ISYS, TIETJE ([id number]) [id number] Page 5 of 8 Nursing Diagnoses: Impaired tissue integrity Knowledge deficit related to  ulceration/compromised skin integrity Goals: Patient/caregiver will verbalize understanding of skin care regimen Date Initiated: 01/07/2023 Target Resolution Date: 02/25/2023 Goal Status: Active Ulcer/skin breakdown will have a volume reduction of 30% by week 4 Date Initiated: 01/07/2023 Target Resolution Date: 02/04/2023 Goal Status: Active Interventions: Assess patient/caregiver ability to obtain necessary supplies Assess patient/caregiver ability to perform ulcer/skin care regimen upon admission and as needed Assess ulceration(s) every visit Provide education on ulcer and skin care Notes: Electronic Signature(s) Signed: 01/07/2023 12:44:31 PM By: Redmond Pulling RN, BSN Entered By: Redmond Pulling on 01/07/2023  10:59:17 -------------------------------------------------------------------------------- Pain Assessment Details Patient Name: Date of Service: [name] 01/07/2023 9:30 A M Medical Record Number: [id number] Patient Account Number: [id number] Date of Birth/Sex: Treating RN: 1956/07/02 (67 y.o. Orville Govern Primary Care Miriana Gaertner: Aliene Beams Other Clinician: Referring Gustava Berland: Treating Jordany Russett/Extender: Alfonse Alpers Weeks in Treatment: 0 Active Problems Location of Pain Severity and Description of Pain Patient Has Paino Yes Site Locations Rate the pain. Current Pain Level: 3 Pain Management and Medication Current Pain Management: Electronic Signature(s) Signed: 01/07/2023 12:44:31 PM By: Redmond Pulling RN, BSN Entered By: Redmond Pulling on 01/07/2023 10:34:57 [name], [name] ([id number]) [id number] Page 6 of 8 -------------------------------------------------------------------------------- Patient/Caregiver Education Details Patient Name: Date of Service: Marissa [name], Marissa BETH G. 6/21/2024andnbsp9:30 A M Medical Record Number: [id number] Patient Account Number: [id number] Date of Birth/Gender: Treating RN: 20-Oct-1955 (67 y.o. Orville Govern Primary Care Physician: Aliene Beams Other Clinician: Referring Physician: Treating Physician/Extender: Alfonse Alpers Weeks in Treatment: 0 Education Assessment Education Provided To: Patient Education Topics Provided Welcome T [facility]New Patient Packet: o Methods: Explain/Verbal Responses: State content correctly Wound/Skin Impairment: Methods: Explain/Verbal Responses: State content correctly Electronic Signature(s) Signed: 01/07/2023 12:44:31 PM By: Redmond Pulling RN, BSN Entered By: Redmond Pulling on 01/07/2023 11:55:43 -------------------------------------------------------------------------------- Wound  Assessment Details Patient Name: Date of Service: [name] 01/07/2023 9:30 A M Medical Record Number: [id number] Patient Account Number: [id number] Date of Birth/Sex: Treating RN: September 10, 1955 (67 y.o. Orville Govern Primary Care Evalie Hargraves: Aliene Beams Other Clinician: Referring Lilybelle Mayeda: Treating Jayde Daffin/Extender: Alfonse Alpers Weeks in Treatment: 0 Wound Status Wound Number: 1 Primary Etiology: Open Surgical Wound Wound Location: Back Wound Status: Open Wounding Event: Surgical Injury Comorbid History: Anemia, Hypertension, Type II Diabetes, Osteoarthritis Date Acquired: 08/11/2022 Weeks Of Treatment: 0 Clustered Wound: No Photos [name], [name] ([id number]) [id number] Page 7 of 8 Wound Measurements Length: (cm) 1.9 Width: (cm) 1.3 Depth: (cm) 0.5 Area: (cm) 1.94 Volume: (cm) 0.97 % Reduction in Area: % Reduction in Volume: Epithelialization: None Tunneling: No Undermining: No Wound Description Classification: Full Thickness Without Exposed Support Structures Exudate Amount: Medium Exudate Type: Serosanguineous Exudate Color: red, brown Foul Odor After Cleansing: No Slough/Fibrino Yes Wound Bed Granulation Amount: Large (67-100%) Exposed Structure Granulation Quality: Red Fascia Exposed: No Necrotic Amount: Small (1-33%) Fat Layer (Subcutaneous Tissue) Exposed: Yes Necrotic Quality: Adherent Slough Tendon Exposed: No Muscle Exposed: No Joint Exposed: No Bone Exposed: No Periwound Skin Texture Texture Color No Abnormalities Noted: No No Abnormalities Noted: No Moisture No Abnormalities Noted: No Treatment Notes Wound #1 (Back) Cleanser Vashe 5.8 (oz) Discharge Instruction: Cleanse the wound with Vashe prior to applying a clean dressing using gauze sponges, not tissue or cotton balls. Peri-Wound Care Topical Primary Dressing MediHoney Gel, tube 1.5 (oz) Discharge Instruction: Apply  to wound bed as instructed Aquacel Ag Dressing, 2x2 (in/in) Secondary Dressing Zetuvit Plus Silicone Border Dressing 4x4 (in/in)  Discharge Instruction: Apply silicone border over primary dressing as directed. Secured With Compression Wrap Compression Stockings Facilities manager) Signed: 01/07/2023 12:44:31 PM By: Redmond Pulling RN, BSN Entered By: Redmond Pulling on 01/07/2023 10:34:10 -------------------------------------------------------------------------------- Vitals Details Patient Name: Date of Service: [name]. 01/07/2023 9:30 A M Medical Record Number: [id number] Patient Account Number: [id number] Date of Birth/Sex: Treating RN: 1955-12-16 (67 y.o. F) Primary Care Reign Bartnick: Aliene Beams Other Clinician: TYRENE, [name] ([id number]) [id number] Page 8 of 8 Referring Maddyx Wieck: Treating Rindi Beechy/Extender: Alfonse Alpers Weeks in Treatment: 0 Vital Signs Time Taken: 10:09 Temperature (F): 98.5 Height (in): 61 Pulse (bpm): 79 Source: Stated Respiratory Rate (breaths/min): 18 Weight (lbs): 139 Blood Pressure (mmHg): 102/68 Source: Stated Capillary Blood Glucose (mg/dl): 782 Body Mass Index (BMI): 26.3 Reference Range: 80 - 120 mg / dl Electronic Signature(s) Signed: 01/07/2023 2:31:37 PM By: Thayer Dallas Entered By: Thayer Dallas on 01/07/2023 10:11:16

## 2023-01-07 NOTE — Progress Notes (Signed)
JENESYS, CASSEUS ([id number]) [phone] [id number] Page 1 of 4 Visit Report for 01/07/2023 Abuse Risk Screen Details Patient Name: Date of Service: TRO [name] 01/07/2023 9:30 A M Medical Record Number: [id number] Patient Account Number: [id number] Date of Birth/Sex: Treating RN: [birth year] (67 y.o. Orville Govern Primary Care Cristi Gwynn: Aliene Beams Other Clinician: Referring Jaena Brocato: Treating Meekah Math/Extender: Alfonse Alpers Weeks in Treatment: 0 Abuse Risk Screen Items Answer ABUSE RISK SCREEN: Has anyone close to you tried to hurt or harm you recentlyo No Do you feel uncomfortable with anyone in your familyo No Has anyone forced you do things that you didnt want to doo No Electronic Signature(s) Signed: 01/07/2023 12:44:31 PM By: Redmond Pulling RN, BSN Entered By: Redmond Pulling on 01/07/2023 10:35:44 -------------------------------------------------------------------------------- Activities of Daily Living Details Patient Name: Date of Service: [name] 01/07/2023 9:30 A M Medical Record Number: [id number] Patient Account Number: [id number] Date of Birth/Sex: Treating RN: 1955-10-12 (67 y.o. Orville Govern Primary Care Honest Safranek: Aliene Beams Other Clinician: Referring Tyrena Gohr: Treating Aira Sallade/Extender: Alfonse Alpers Weeks in Treatment: 0 Activities of Daily Living Items Answer Activities of Daily Living (Please select one for each item) Drive Automobile Not Able T Medications ake Need Assistance Use T elephone Completely Able Care for Appearance Completely Able Use T oilet Need Assistance Bath / Shower Need Assistance Dress Self Completely Able Feed Self Completely Able Walk Need Assistance Get In / Out Bed Need Assistance Housework Need Assistance Prepare Meals Need Assistance Handle Money Completely Able Shop for Self Need Assistance Electronic  Signature(s) Signed: 01/07/2023 12:44:31 PM By: Redmond Pulling RN, BSN Entered By: Redmond Pulling on 01/07/2023 10:37:32 Jalisia, Puchalski Gardiner Ramus ([id number]) [phone] [id number] Page 2 of 4 -------------------------------------------------------------------------------- Education Screening Details Patient Name: Date of Service: TRO [name] 01/07/2023 9:30 A M Medical Record Number: [id number] Patient Account Number: [id number] Date of Birth/Sex: Treating RN: 10-03-1955 (67 y.o. F) Primary Care Myrical Andujo: Aliene Beams Other Clinician: Referring Sairah Knobloch: Treating Dorothyann Mourer/Extender: Alfonse Alpers Weeks in Treatment: 0 Primary Learner Assessed: Patient Learning Preferences/Education Level/Primary Language Learning Preference: Explanation, Demonstration, Printed Material Highest Education Level: College or Above Preferred Language: English Cognitive Barrier Language Barrier: No Translator Needed: No Memory Deficit: No Emotional Barrier: No Cultural/Religious Beliefs Affecting Medical Care: No Physical Barrier Impaired Vision: Yes Glasses Impaired Hearing: No Knowledge/Comprehension Knowledge Level: High Comprehension Level: High Ability to understand written instructions: High Ability to understand verbal instructions: High Motivation Anxiety Level: Calm Cooperation: Cooperative Education Importance: Acknowledges Need Interest in Health Problems: Asks Questions Perception: Coherent Willingness to Engage in Self-Management High Activities: Readiness to Engage in Self-Management High Activities: Electronic Signature(s) Signed: 01/07/2023 2:31:37 PM By: Thayer Dallas Entered By: Thayer Dallas on 01/07/2023 10:37:51 -------------------------------------------------------------------------------- Fall Risk Assessment Details Patient Name: Date of Service: [name] 01/07/2023 9:30 A M Medical Record Number:  [id number] Patient Account Number: [id number] Date of Birth/Sex: Treating RN: 1956-01-21 (67 y.o. Orville Govern Primary Care Shakema Surita: Aliene Beams Other Clinician: Referring Linken Mcglothen: Treating Payslie Mccaig/Extender: Alfonse Alpers Weeks in Treatment: 0 Fall Risk Assessment Items Have you had 2 or more falls in the last 12 monthso 0 Yes Have you had any fall that resulted in injury in the last [address] SELEEN, WALTER ([id number]) [phone] [id number] Page 3 of 4 FALLS RISK SCREEN History of falling - immediate or within 3 months 25 Yes Secondary diagnosis (Do you have 2 or more medical  diagnoseso) 15 Yes Ambulatory aid None/bed rest/wheelchair/nurse 0 No Crutches/cane/walker 15 Yes Furniture 0 No Intravenous therapy Access/Saline/Heparin Lock 0 No Gait/Transferring Normal/ bed rest/ wheelchair 0 No Weak (short steps with or without shuffle, stooped but able to lift head while walking, may seek 10 Yes support from furniture) Impaired (short steps with shuffle, may have difficulty arising from chair, head down, impaired 0 No balance) Mental Status Oriented to own ability 0 Yes Electronic Signature(s) Signed: 01/07/2023 12:44:31 PM By: Redmond Pulling RN, BSN Entered By: Redmond Pulling on 01/07/2023 10:38:48 -------------------------------------------------------------------------------- Foot Assessment Details Patient Name: Date of Service: [name] 01/07/2023 9:30 A M Medical Record Number: [id number] Patient Account Number: [id number] Date of Birth/Sex: Treating RN: [birth year] (67 y.o. F) Primary Care Deshante Cassell: Aliene Beams Other Clinician: Referring Enisa Runyan: Treating Lyliana Dicenso/Extender: Alfonse Alpers Weeks in Treatment: 0 Foot Assessment Items Site Locations + = Sensation present, - = Sensation absent, C = Callus, U = Ulcer R = Redness, W = Warmth, M = Maceration, PU =  Pre-ulcerative lesion F = Fissure, S = Swelling, D = Dryness Assessment Right: Left: Other Deformity: No No Prior Foot Ulcer: No No Prior Amputation: No No Charcot Joint: No No Ambulatory Status: Ambulatory With Help Assistance Device: Wheelchair GaitUNNAMED, HINO ([id number]) [phone] [id number] Page 4 of 4 Notes wound on lower back Electronic Signature(s) Signed: 01/07/2023 2:31:37 PM By: Thayer Dallas Entered By: Thayer Dallas on 01/07/2023 10:36:43 -------------------------------------------------------------------------------- Nutrition Risk Screening Details Patient Name: Date of Service: [name] 01/07/2023 9:30 A M Medical Record Number: [id number] Patient Account Number: [id number] Date of Birth/Sex: Treating RN: 15-Feb-1956 (67 y.o. Orville Govern Primary Care Avionna Bower: Aliene Beams Other Clinician: Referring Alphonsine Minium: Treating Masiyah Engen/Extender: Stanford Breed, YURI Weeks in Treatment: 0 Height (in): 61 Weight (lbs): 139 Body Mass Index (BMI): 26.3 Nutrition Risk Screening Items Score Screening NUTRITION RISK SCREEN: I have an illness or condition that made me change the kind and/or amount of food I eat 2 Yes I eat fewer than two meals per day 0 No I eat few fruits and vegetables, or milk products 0 No I have three or more drinks of beer, liquor or wine almost every day 0 No I have tooth or mouth problems that make it hard for me to eat 0 No I don't always have enough money to buy the food I need 0 No I eat alone most of the time 0 No I take three or more different prescribed or over-the-counter drugs a day 1 Yes Without wanting to, I have lost or gained 10 pounds in the last six months 2 Yes I am not always physically able to shop, cook and/or feed myself 0 No Nutrition Protocols Good Risk Protocol Moderate Risk Protocol High Risk Proctocol Risk Level: Moderate Risk Score:  5 Electronic Signature(s) Signed: 01/07/2023 12:44:31 PM By: Redmond Pulling RN, BSN Entered By: Redmond Pulling on 01/07/2023 10:40:26

## 2023-01-08 NOTE — Progress Notes (Signed)
[name] ([id number]) [id number] Page 1 of 9 Visit Report for 01/07/2023 Chief Complaint Document Details Patient Name: Date of Service: [name] 01/07/2023 9:30 A M Medical Record Number: [id number] Patient Account Number: [id number] Date of Birth/Sex: Treating RN: [birth year] (67 y.o. F) Primary Care Provider: Aliene [name] Other Clinician: Referring Provider: Treating Provider/Extender: [name] Weeks in Treatment: 0 Information Obtained from: Patient Chief Complaint 01/07/2023; back wound Electronic Signature(s) Signed: 01/07/2023 1:00:24 PM By: Marissa Corwin DO Entered By: [name] on 01/07/2023 11:46:39 -------------------------------------------------------------------------------- HPI Details Patient Name: Date of Service: [name] 01/07/2023 9:30 A M Medical Record Number: [id number] Patient Account Number: [id number] Date of Birth/Sex: Treating RN: 1956-01-11 (67 y.o. F) Primary Care Provider: Aliene [name] Other Clinician: Referring Provider: Treating Provider/Extender: [name] Weeks in Treatment: 0 History of Present Illness HPI Description: 01/07/2023 [name] is a 67 year old female with a past medical history of controlled type 2 diabetes on oral agents, lumbar fractures and bipolar disorder that presents to the clinic for a 22-month history of nonhealing ulcer to the back. On 06/26/2022 she had L1-L5 fracture stabilization and posterior lateral instrumented fusion secondary to closed unstable fracture of the lumbar spine. This was complicated by wound dehiscence and exposed hardware and she subsequently required revision of the L1-L5 with removal of screws and rods. Post surgery she had complications with CSF leak and developed bacterial meningitis/ventriculitis. She required hospitalization and IV antibiotics and is currently  following with infectious disease and is on cefdinir and doxycycline. She recently had a CT thoracic spine and CT lumbar spine on 12/01/2022 with no evidence of acute osseous abnormalities. No evidence of osteomyelitis. She currently denies signs of systemic infection. She has been using Medihoney and Aquacel Ag to the wound bed. Unfortunately she cannot lay on her sides and lays directly on her back when laying down. Electronic Signature(s) Signed: 01/07/2023 1:00:24 PM By: Marissa Corwin DO Entered By: [name] on 01/07/2023 11:52:44 -------------------------------------------------------------------------------- Chemical Cauterization Details Patient Name: Date of Service: [name] 01/07/2023 9:30 A M Medical Record Number: [id number] Patient Account Number: [id number] Marissa, [name] ([id number]) [id number] Page 2 of 9 Date of Birth/Sex: Treating RN: 18-Jul-1956 (67 y.o. [name] Primary Care Provider: Aliene [name] Other Clinician: Referring Provider: Treating Provider/Extender: [name] Weeks in Treatment: 0 Procedure Performed for: Wound #1 Back Performed By: Physician Marissa Corwin, DO Post Procedure Diagnosis Same as Pre-procedure Notes Silver nitrate to hypertrophic granulation tissue. Scribed for Dr [name] by Redmond Pulling, RN Electronic Signature(s) Signed: 01/07/2023 12:44:31 PM By: Redmond Pulling RN, BSN Signed: 01/07/2023 1:00:24 PM By: Marissa Corwin DO Entered By: Redmond Pulling on 01/07/2023 11:00:30 -------------------------------------------------------------------------------- Physical Exam Details Patient Name: Date of Service: [name] 01/07/2023 9:30 A M Medical Record Number: [id number] Patient Account Number: [id number] Date of Birth/Sex: Treating RN: [birth year] (68 y.o. F) Primary Care Provider: Aliene [name] Other Clinician: Referring  Provider: Treating Provider/Extender: [name] Weeks in Treatment: 0 Constitutional respirations regular, non-labored and within target range for patient.[name] Psychiatric pleasant and cooperative. Notes T the thoracic region on the posterior back there is an open wound with hyper granulated areas with several layers of granulation tissue and undermining o occurring to 2 of these. No signs of active infection. Electronic Signature(s) Signed: 01/07/2023 1:00:24 PM By: Marissa Corwin DO Entered By: [name] on 01/07/2023 11:53:28 -------------------------------------------------------------------------------- Physician Orders Details  Patient Name: Date of Service: [name] 01/07/2023 9:30 A M Medical Record Number: [id number] Patient Account Number: [id number] Date of Birth/Sex: Treating RN: 1955/12/05 (67 y.o. 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 - Dr [name] Return A Anesthetic Wound #1 Back (In clinic) Topical Lidocaine 4% applied to wound bed Bathing/ Shower/ Hygiene May shower and wash wound with soap and water. - Shower prior to dressing change, otherwise keep dressing dry Off-Loading Wound #1 Back Turn and reposition every 2 hours - off load wound area with  pillows Wound Treatment Wound #1 - Back Cleanser: Vashe 5.8 (oz) Every Other Day/15 Days Discharge Instructions: Cleanse the wound with Vashe prior to applying a clean dressing using gauze sponges, not tissue or cotton balls. Prim Dressing: MediHoney Gel, tube 1.5 (oz) Every Other Day/15 Days ary Discharge Instructions: Apply to wound bed as instructed Prim Dressing: Aquacel Ag Dressing, 2x2 (in/in) ary Every Other Day/15 Days Secondary Dressing: Zetuvit Plus Silicone Border Dressing 4x4 (in/in) Every Other Day/15 Days Discharge Instructions: Apply silicone border over primary dressing as directed. Patient Medications llergies: No Known Drug Allergies A Notifications Medication Indication Start End 01/07/2023 lidocaine DOSE topical 4 % cream - cream topical once daily Electronic Signature(s) Signed: 01/07/2023 1:00:24 PM By: Marissa Corwin DO Entered By: [name] on 01/07/2023 11:53:33 -------------------------------------------------------------------------------- Problem List Details Patient Name: Date of Service: [name]. 01/07/2023 9:30 A M Medical Record Number: [id number] Patient Account Number: [id number] Date of Birth/Sex: Treating RN: [birth year] (67 y.o. 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 01/07/2023 No Yes Marissa, [name] ([id number]) [id number] Page 4 of 9 E11.622 Type 2 diabetes mellitus with other skin ulcer 01/07/2023 No Yes T81.31XA Disruption of external operation (surgical) wound, not elsewhere classified, 01/07/2023 No Yes initial encounter S32.009A Unspecified fracture of unspecified lumbar vertebra, initial encounter for 01/07/2023 No  Yes closed fracture Inactive Problems Resolved Problems Electronic Signature(s) Signed: 01/07/2023 1:00:24 PM By: Marissa Corwin DO Entered By: [name] on 01/07/2023 11:46:14 -------------------------------------------------------------------------------- Progress Note Details Patient Name: Date of Service: [name] 01/07/2023 9:30 A M Medical Record Number: [id number] Patient Account Number: [id number] Date of Birth/Sex: Treating RN: [birth year] (67 y.o. F) Primary Care Provider: Aliene [name] Other Clinician: Referring Provider: Treating Provider/Extender: [name] Weeks in Treatment: 0 Subjective Chief Complaint Information obtained from Patient 01/07/2023; back wound History of Present Illness (HPI) 01/07/2023 Ms. [name] is a 67 year old female with a past medical history of controlled type 2 diabetes on oral agents, lumbar fractures and bipolar disorder that presents to the clinic for a 82-month history of nonhealing ulcer to the back. On 06/26/2022 she had L1-L5 fracture stabilization and posterior lateral instrumented fusion secondary to closed unstable fracture of the lumbar spine. This was complicated by wound  dehiscence and exposed hardware and she subsequently required revision of the L1-L5 with removal of screws and rods. Post surgery she had complications with CSF leak and developed bacterial meningitis/ventriculitis. She required hospitalization and IV antibiotics and is currently following with infectious disease and is on cefdinir and doxycycline. She recently had a CT thoracic spine and CT lumbar spine on 12/01/2022 with no evidence of acute osseous abnormalities. No evidence of osteomyelitis. She currently denies signs of systemic infection. She has been using Medihoney and Aquacel Ag to the wound bed. Unfortunately she cannot lay on her sides and lays directly on her back when laying down. Patient  History Allergies No Known Drug Allergies Family History Seizures - Mother, Stroke - Father, No family history of Cancer, Diabetes, Heart Disease, Hereditary Spherocytosis, Hypertension, Kidney Disease, Lung Disease, Thyroid Problems, Tuberculosis. Social History Former smoker - quit 07/2022, Marital Status - Married, Alcohol Use - Never, Drug Use - No History, Caffeine Use - Never. Medical History Eyes Patient has history of Cataracts Denies history of Glaucoma, Optic Neuritis Ear/Nose/Mouth/Throat Denies history of Chronic sinus problems/congestion, Middle ear problems Hematologic/Lymphatic Patient has history of Anemia Denies history of Hemophilia, Human Immunodeficiency Virus, Lymphedema, Sickle Cell Disease Cardiovascular MARCEDES, TECH ([id number]) [id number] Page 5 of 9 Patient has history of Hypertension Denies history of Angina, Arrhythmia, Congestive Heart Failure, Coronary Artery Disease, Deep Vein Thrombosis, Hypotension, Myocardial Infarction, Peripheral Arterial Disease, Peripheral Venous Disease, Phlebitis, Vasculitis Gastrointestinal Denies history of Cirrhosis , Colitis, Crohns, Hepatitis A, Hepatitis B, Hepatitis C Endocrine Patient has history of Type II Diabetes Genitourinary Denies history of End Stage Renal Disease Immunological Denies history of Lupus Erythematosus, Raynauds, Scleroderma Integumentary (Skin) Denies history of History of Burn Musculoskeletal Patient has history of Osteoarthritis Denies history of Gout, Rheumatoid Arthritis, Osteomyelitis Neurologic Denies history of Dementia, Neuropathy, Quadriplegia, Paraplegia, Seizure Disorder Psychiatric Patient has history of Confinement Anxiety Denies history of Anorexia/bulimia Patient is treated with Oral Agents. Hospitalization/Surgery History - 01/06/2023 osteoporotric vertebral fx. - 06/26/2022 Posterior lumbar fusion 4 level (N/A). - 1996 spinal fusion. - 2015  both hip replacement. - 2018 left shoulder sx. - hysterectomy. Medical A Surgical History Notes nd Constitutional Symptoms (General Health) surgery 06/26/2022 due to a fall and fx fusion- complications hardware exposed, underwent surgery 08/09/2022 revision- surgical site unable to close. Hyperthyroidism Cardiovascular Ventricular septal defect Paroxysmal ventricular tachycardia Gastrointestinal GERD Psychiatric bipolar anxiety depression Review of Systems (ROS) Eyes Complains or has symptoms of Glasses / Contacts. Ear/Nose/Mouth/Throat Denies complaints or symptoms of Chronic sinus problems or rhinitis. Respiratory Denies complaints or symptoms of Chronic or frequent coughs, Shortness of Breath. Gastrointestinal Denies complaints or symptoms of Frequent diarrhea, Nausea, Vomiting. Genitourinary Denies complaints or symptoms of Frequent urination. Integumentary (Skin) Complains or has symptoms of Wounds - midline back. Musculoskeletal Denies complaints or symptoms of Muscle Pain, Muscle Weakness. Neurologic Denies complaints or symptoms of Numbness/parasthesias. Psychiatric Denies complaints or symptoms of Claustrophobia. Objective Constitutional respirations regular, non-labored and within target range for patient.. Vitals Time Taken: 10:09 AM, Height: 61 in, Source: Stated, Weight: 139 lbs, Source: Stated, BMI: 26.3, Temperature: 98.5 F, Pulse: 79 bpm, Respiratory Rate: 18 breaths/min, Blood Pressure: 102/68 mmHg, Capillary Blood Glucose: 112 mg/dl. Psychiatric pleasant and cooperative. General Notes: T the thoracic region on the posterior back there is an open wound with hyper granulated areas with several layers of granulation tissue and o undermining occurring to 2 of these. No signs of active infection. Integumentary (Hair, Skin) Wound #1 status is Open. Original cause of  wound was Surgical Injury. The date acquired was: 08/11/2022. The wound is located on the Back. The  wound measures 1.9cm length x 1.3cm width x 0.5cm depth; 1.94cm^2 area and 0.97cm^3 volume. There is Fat Layer (Subcutaneous Tissue) exposed. There is no tunneling or undermining noted. There is a medium amount of serosanguineous drainage noted. There is large (67-100%) red granulation within the wound bed. There is a small (1-33%) amount of necrotic tissue within the wound bed including Adherent Slough. 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 She had a vertebral fracture with surgical intervention that was complicated by wound dehiscence and hardware exposure that was removed and following surgery had complications with CSF leak that result send in meningitis and ventriculitis. She is currently on chronic oral antibiotics consisting of cefdinir and doxycycline. No evidence of infection on CT scans to her back. I used silver nitrate to the hypergranulating areas. I recommended continue with Medihoney and Aquacel Ag for now. She will benefit from a skin substitute in the near future. Unfortunately her main barrier to healing is pressure as she does not lay on her sides and lays directly on her back. I recommended she try and shift her weight to help relieve the pressure to this area. No signs of infection. Follow-up in 2 weeks. Procedures Wound #1 Pre-procedure diagnosis of Wound #1 is an Open Surgical Wound located on the Back . An Chemical Cauterization procedure was performed by Marissa Corwin, DO. Post procedure Diagnosis Wound #1: Same as Pre-Procedure Notes: Silver nitrate to  hypertrophic granulation tissue. Scribed for Dr [name] by Redmond Pulling, RN Plan Follow-up Appointments: Return Appointment in 2 weeks. - Dr [name] Anesthetic: Wound #1 Back: (In clinic) Topical Lidocaine 4% applied to wound bed Bathing/ Shower/ Hygiene: May shower and wash wound with soap and water. - Shower prior to dressing change, otherwise keep dressing dry Off-Loading: Wound #1 Back: Turn and reposition every 2 hours - off load wound area with pillows The following medication(s) was prescribed: lidocaine topical 4 % cream cream topical once daily was prescribed at facility WOUND #1: - Back Wound Laterality: Cleanser: Vashe 5.8 (oz) Every Other Day/15 Days Discharge Instructions: Cleanse the wound with Vashe prior to applying a clean dressing using gauze sponges, not tissue or cotton balls. Prim Dressing: MediHoney Gel, tube 1.5 (oz) Every Other Day/15 Days ary Discharge Instructions: Apply to wound bed as instructed Prim Dressing: Aquacel Ag Dressing, 2x2 (in/in) Every Other Day/15 Days ary Secondary Dressing: Zetuvit Plus Silicone Border Dressing 4x4 (in/in) Every Other Day/15 Days Discharge Instructions: Apply silicone border over primary dressing as directed. 1. Silver nitrate 2. Aquacel Ag and Medihoney 3. Follow-up in 2 weeks 4. Aggressive offloading Electronic Signature(s) Signed: 01/07/2023 1:00:24 PM By: Marissa Corwin DO Entered By: [name] on 01/07/2023 11:56:15 -------------------------------------------------------------------------------- HxROS Details Patient Name: Date of Service: [name] 01/07/2023 9:30 A M Medical Record Number: [id number] Patient Account Number: [id number] Date of Birth/Sex: Treating RN: 02/24/1956 (67 y.o. [address] Marissa, [name] ([id number]) [id number] Page 7 of 9 Primary Care Provider: Aliene [name] Other Clinician: Referring Provider: Treating Provider/Extender:  Stanford Breed, YURI Weeks in Treatment: 0 Eyes Complaints and Symptoms:  Positive for: Glasses / Contacts Medical History: Positive for: Cataracts Negative for: Glaucoma; Optic Neuritis Ear/Nose/Mouth/Throat Complaints and Symptoms: Negative for: Chronic sinus problems or rhinitis Medical History: Negative for: Chronic sinus problems/congestion; Middle ear problems Respiratory Complaints and Symptoms: Negative for: Chronic or frequent coughs; Shortness of Breath Gastrointestinal Complaints and Symptoms: Negative for: Frequent diarrhea; Nausea; Vomiting Medical History: Negative for: Cirrhosis ; Colitis; Crohns; Hepatitis A; Hepatitis B; Hepatitis C Past Medical History Notes: GERD Genitourinary Complaints and Symptoms: Negative for: Frequent urination Medical History: Negative for: End Stage Renal Disease Integumentary (Skin) Complaints and Symptoms: Positive for: Wounds - midline back Medical History: Negative for: History of Burn Musculoskeletal Complaints and Symptoms: Negative for: Muscle Pain; Muscle Weakness Medical History: Positive for: Osteoarthritis Negative for: Gout; Rheumatoid Arthritis; Osteomyelitis Neurologic Complaints and Symptoms: Negative for: Numbness/parasthesias Medical History: Negative for: Dementia; Neuropathy; Quadriplegia; Paraplegia; Seizure Disorder Psychiatric Complaints and Symptoms: Negative for: Claustrophobia Medical History: Positive for: Confinement Anxiety Negative for: Anorexia/bulimia Past Medical History Notes: bipolar anxiety depression Marissa, [name] ([id number]) [id number] Page 8 of 9 Constitutional Symptoms (General Health) Medical History: Past Medical History Notes: surgery 06/26/2022 due to a fall and fx fusion- complications hardware exposed, underwent surgery 08/09/2022 revision- surgical site unable to close. Hyperthyroidism Hematologic/Lymphatic Medical  History: Positive for: Anemia Negative for: Hemophilia; Human Immunodeficiency Virus; Lymphedema; Sickle Cell Disease Cardiovascular Medical History: Positive for: Hypertension Negative for: Angina; Arrhythmia; Congestive Heart Failure; Coronary Artery Disease; Deep Vein Thrombosis; Hypotension; Myocardial Infarction; Peripheral Arterial Disease; Peripheral Venous Disease; Phlebitis; Vasculitis Past Medical History Notes: Ventricular septal defect Paroxysmal ventricular tachycardia Endocrine Medical History: Positive for: Type II Diabetes Time with diabetes: 10 years Treated with: Oral agents Immunological Medical History: Negative for: Lupus Erythematosus; Raynauds; Scleroderma Oncologic HBO Extended History Items Eyes: Cataracts Immunizations Pneumococcal Vaccine: Received Pneumococcal Vaccination: Yes Received Pneumococcal Vaccination On or After 60th Birthday: Yes Implantable Devices Yes Hospitalization / Surgery History Type of Hospitalization/Surgery 01/06/2023 osteoporotric vertebral fx 06/26/2022 Posterior lumbar fusion 4 level (N/A) 1996 spinal fusion 2015 both hip replacement 2018 left shoulder sx hysterectomy Family and Social History Cancer: No; Diabetes: No; Heart Disease: No; Hereditary Spherocytosis: No; Hypertension: No; Kidney Disease: No; Lung Disease: No; Seizures: Yes - Mother; Stroke: Yes - Father; Thyroid Problems: No; Tuberculosis: No; Former smoker - quit 07/2022; Marital Status - Married; Alcohol Use: Never; Drug Use: No History; Caffeine Use: Never; Financial Concerns: No; Food, Clothing or Shelter Needs: No; Support System Lacking: No; Transportation Concerns: No Electronic Signature(s) Signed: 01/07/2023 1:00:24 PM By: Marissa Corwin DO Signed: 01/07/2023 2:31:37 PM By: Thayer Dallas Signed: 01/08/2023 4:22:51 PM By: Shawn Stall RN, BSN Entered By: Thayer Dallas on 01/07/2023 10:35:51 Wretha, Laris Gardiner Ramus ([id number])  [id number] Page 9 of 9 -------------------------------------------------------------------------------- SuperBill Details Patient Name: Date of Service: [name] 01/07/2023 Medical Record Number: [id number] Patient Account Number: [id number] Date of Birth/Sex: Treating RN: Mar 21, 1956 (67 y.o. 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 E11.622 Type 2 diabetes mellitus with other skin ulcer T81.31XA Disruption of external operation (surgical) wound, not elsewhere classified, initial encounter S32.009A Unspecified fracture of unspecified lumbar vertebra, initial encounter for closed fracture Facility Procedures : CPT4 Code: [id number] Description: 17250 - CHEM CAUT GRANULATION TISS ICD-10 Diagnosis Description L98.422 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 E11.622 Type 2 diabetes mellitus with other skin  ulcer Modifier: Quantity: 1 Physician Procedures : CPT4 Code Description Modifier [id number] - WC PHYS LEVEL 4 - NEW PT 25 ICD-10 Diagnosis Description L98.422 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 E11.622 Type 2 diabetes mellitus with other skin ulcer T81.31XA Disruption of  external operation (surgical) wound, not elsewhere classified, initial encounter S32.009A Unspecified fracture of unspecified lumbar vertebra, initial encounter for closed fracture Quantity: 1 : [id number] - WC PHYS CHEM CAUT GRAN TISSUE ICD-10 Diagnosis Description L98.422 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

## 2023-01-18 ENCOUNTER — Ambulatory Visit (HOSPITAL_BASED_OUTPATIENT_CLINIC_OR_DEPARTMENT_OTHER): Payer: Medicare Other | Admitting: Internal Medicine

## 2023-01-19 ENCOUNTER — Telehealth: Payer: Self-pay | Admitting: Physical Medicine & Rehabilitation

## 2023-01-19 DIAGNOSIS — M4626 Osteomyelitis of vertebra, lumbar region: Secondary | ICD-10-CM

## 2023-01-19 NOTE — Telephone Encounter (Signed)
Patient called and is requesting an outpatient referral for neuro PT , states she has finished in home therapy and would like to be referred to go in for PT /OT with [facility] since her insurance stopped covering in home therapy

## 2023-01-24 ENCOUNTER — Encounter (HOSPITAL_BASED_OUTPATIENT_CLINIC_OR_DEPARTMENT_OTHER): Payer: Medicare Other | Attending: Internal Medicine | Admitting: Internal Medicine

## 2023-01-24 DIAGNOSIS — T8131XA Disruption of external operation (surgical) wound, not elsewhere classified, initial encounter: Secondary | ICD-10-CM | POA: Insufficient documentation

## 2023-01-24 DIAGNOSIS — X58XXXA Exposure to other specified factors, initial encounter: Secondary | ICD-10-CM | POA: Insufficient documentation

## 2023-01-24 DIAGNOSIS — E11622 Type 2 diabetes mellitus with other skin ulcer: Secondary | ICD-10-CM | POA: Insufficient documentation

## 2023-01-24 DIAGNOSIS — Z87891 Personal history of nicotine dependence: Secondary | ICD-10-CM | POA: Insufficient documentation

## 2023-01-24 DIAGNOSIS — L98422 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: Secondary | ICD-10-CM

## 2023-01-24 DIAGNOSIS — S32009A Unspecified fracture of unspecified lumbar vertebra, initial encounter for closed fracture: Secondary | ICD-10-CM | POA: Diagnosis not present

## 2023-01-25 ENCOUNTER — Encounter: Payer: Self-pay | Admitting: Physical Medicine & Rehabilitation

## 2023-01-25 NOTE — Telephone Encounter (Signed)
Patient notified provider on vacation

## 2023-01-25 NOTE — Telephone Encounter (Signed)
Pt called back today wanting a update

## 2023-01-26 ENCOUNTER — Encounter: Payer: Self-pay | Admitting: Physical Medicine & Rehabilitation

## 2023-01-26 NOTE — Progress Notes (Signed)
[name], [name] ([id number]) [id number] Page 1 of 8 Visit Report for 01/24/2023 Chief Complaint Document Details Patient Name: Date of Service: TRO [name] 01/24/2023 2:45 PM Medical Record Number: [id number] Patient Account Number: [id number] Date of Birth/Sex: Treating RN: 1956/03/16 (67 y.o. F) Primary Care Provider: Aliene Beams Other Clinician: Referring Provider: Treating Provider/Extender: Marguerite Olea in Treatment: 2 Information Obtained from: Patient Chief Complaint 01/07/2023; back wound Electronic Signature(s) Signed: 01/24/2023 4:36:37 PM By: Geralyn Corwin DO Entered By: Geralyn Corwin on 01/24/2023 15:38:32 -------------------------------------------------------------------------------- HPI Details Patient Name: Date of Service: [name] 01/24/2023 2:45 PM Medical Record Number: [id number] Patient Account Number: [id number] Date of Birth/Sex: Treating RN: Jun 03, 1956 (67 y.o. F) Primary Care Provider: Aliene Beams Other Clinician: Referring Provider: Treating Provider/Extender: Marguerite Olea in Treatment: 2 History of Present Illness HPI Description: 01/07/2023 Ms. [name] is a 67 year old female with a past medical history of controlled type 2 diabetes on oral agents, lumbar fractures and bipolar disorder that presents to the clinic for a 56-month history of nonhealing ulcer to the back. On 06/26/2022 she had L1-L5 fracture stabilization and posterior lateral instrumented fusion secondary to closed unstable fracture of the lumbar spine. This was complicated by wound dehiscence and exposed hardware and she subsequently required revision of the L1-L5 with removal of screws and rods. Post surgery she had complications with CSF leak and developed bacterial meningitis/ventriculitis. She required hospitalization and IV antibiotics and is currently following with  infectious disease and is on cefdinir and doxycycline. She recently had a CT thoracic spine and CT lumbar spine on 12/01/2022 with no evidence of acute osseous abnormalities. No evidence of osteomyelitis. She currently denies signs of systemic infection. She has been using Medihoney and Aquacel Ag to the wound bed. Unfortunately she cannot lay on her sides and lays directly on her back when laying down. 7/8; patient presents for follow-up. She has been using Aquacel Ag with Medihoney to the wound bed. She has no issues or complaints today. Wound is smaller. Electronic Signature(s) Signed: 01/24/2023 4:36:37 PM By: Geralyn Corwin DO Entered By: Geralyn Corwin on 01/24/2023 15:38:54 Chemical Cauterization Details -------------------------------------------------------------------------------- [name] ([id number]) [id number] Page 2 of 8 Patient Name: Date of Service: TRO [name] 01/24/2023 2:45 PM Medical Record Number: [id number] Patient Account Number: [id number] Date of Birth/Sex: Treating RN: 1955-12-01 (67 y.o. [name] Primary Care Provider: Aliene Beams Other Clinician: Referring Provider: Treating Provider/Extender: Marguerite Olea in Treatment: 2 Procedure Performed for: Wound #1 Back Performed By: Physician Geralyn Corwin, DO Post Procedure Diagnosis Same as Pre-procedure Electronic Signature(s) Signed: 01/24/2023 4:36:37 PM By: Geralyn Corwin DO Signed: 01/26/2023 4:28:57 PM By: Brenton Grills Entered By: Brenton Grills on 01/24/2023 15:33:25 -------------------------------------------------------------------------------- Physical Exam Details Patient Name: Date of Service: [name] 01/24/2023 2:45 PM Medical Record Number: [id number] Patient Account Number: [id number] Date of Birth/Sex: Treating RN: May 11, 1956 (67 y.o. F) Primary Care Provider: Aliene Beams Other  Clinician: Referring Provider: Treating Provider/Extender: Marguerite Olea in Treatment: 2 Constitutional respirations regular, non-labored and within target range for patient.[name] Psychiatric pleasant and cooperative. Notes T the thoracic region on the posterior back there is an open wound with hyper granulated tissue and granulation tissue present. Undermining to the hyper o granulated area. No signs of active infection. Electronic Signature(s) Signed: 01/24/2023 4:36:37 PM By: Geralyn Corwin DO Entered By: Geralyn Corwin on 01/24/2023 15:39:32 -------------------------------------------------------------------------------- Physician Orders Details Patient  Name: Date of Service: TRO [name] 01/24/2023 2:45 PM Medical Record Number: [id number] Patient Account Number: [id number] Date of Birth/Sex: Treating RN: December 31, 1955 (67 y.o. [name] Primary Care Provider: Aliene Beams Other Clinician: Referring Provider: Treating Provider/Extender: Marguerite Olea in Treatment: 2 Verbal / Phone Orders: No Diagnosis Coding Follow-up Appointments ppointment in 2 weeks. - Dr Claudie Fisherman, [place] ([id number]) [phone].pdf Page 3 of 8 Anesthetic Wound #1 Back (In clinic) Topical Lidocaine 4% applied to wound bed Bathing/ Shower/ Hygiene May shower and wash wound with soap and water. - Shower prior to dressing change, otherwise keep dressing dry Off-Loading Wound #1 Back Turn and reposition every 2 hours - off load wound area with pillows Wound Treatment Wound #1 - Back Cleanser: Vashe 5.8 (oz) Every Other Day/15 Days Discharge Instructions: Cleanse the wound with Vashe prior to applying a clean dressing using gauze sponges, not tissue or cotton balls. Prim Dressing: MediHoney Gel, tube 1.5 (oz) Every Other Day/15 Days ary Discharge Instructions: Apply to wound bed as  instructed Prim Dressing: Aquacel Ag Dressing, 2x2 (in/in) ary Every Other Day/15 Days Secondary Dressing: Zetuvit Plus Silicone Border Dressing 4x4 (in/in) Every Other Day/15 Days Discharge Instructions: Apply silicone border over primary dressing as directed. Electronic Signature(s) Signed: 01/24/2023 4:36:37 PM By: Geralyn Corwin DO Entered By: Geralyn Corwin on 01/24/2023 15:39:39 -------------------------------------------------------------------------------- Problem List Details Patient Name: Date of Service: [name] 01/24/2023 2:45 PM Medical Record Number: [id number] Patient Account Number: [id number] Date of Birth/Sex: Treating RN: 07-05-1956 (67 y.o. 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 01/07/2023 No Yes E11.622 Type 2 diabetes mellitus with other skin ulcer 01/07/2023 No Yes T81.31XA Disruption of external operation (surgical) wound, not elsewhere classified, 01/07/2023 No Yes initial encounter S32.009A Unspecified fracture of unspecified lumbar vertebra, initial encounter for 01/07/2023 No Yes closed fracture Inactive Problems Resolved Problems [name], [name] ([id number]) [id number] Page 4 of 8 Electronic Signature(s) Signed: 01/24/2023 4:36:37 PM By: Geralyn Corwin DO Entered By: Geralyn Corwin on 01/24/2023 15:37:26 -------------------------------------------------------------------------------- Progress Note Details Patient Name: Date of Service: [name] 01/24/2023 2:45 PM Medical Record Number: [id number] Patient Account Number: [id number] Date of Birth/Sex: Treating RN: Jan 21, 1956 (67 y.o. F) Primary Care Provider: Aliene Beams Other Clinician: Referring Provider: Treating Provider/Extender: Marguerite Olea in Treatment: 2 Subjective Chief Complaint Information obtained from Patient 01/07/2023; back wound History of Present Illness (HPI) 01/07/2023 Ms. Alfie Alderfer is a 67 year old female with a past medical history of controlled type 2 diabetes on oral agents, lumbar fractures and bipolar disorder that presents to the clinic for a 57-month history of nonhealing ulcer to the back. On 06/26/2022 she had L1-L5 fracture stabilization and posterior lateral instrumented fusion secondary to closed unstable fracture of the lumbar spine. This was complicated by wound dehiscence and exposed hardware and she subsequently required revision of the L1-L5 with removal of screws and rods. Post surgery she had complications with CSF leak and developed bacterial meningitis/ventriculitis. She required hospitalization and IV antibiotics and is currently following with infectious disease and is on cefdinir and doxycycline. She recently had a CT thoracic spine and CT lumbar spine on 12/01/2022 with no evidence of acute osseous abnormalities. No evidence of osteomyelitis. She currently denies signs  of systemic infection. She has been using Medihoney and Aquacel Ag to the wound bed. Unfortunately she cannot lay on her sides and lays directly on her back when laying down. 7/8; patient presents for follow-up. She has been using Aquacel Ag with Medihoney to the wound bed. She has no issues or complaints today. Wound is smaller. Patient History Family History Seizures - Mother, Stroke - Father, No family history of Cancer, Diabetes, Heart Disease, Hereditary Spherocytosis, Hypertension, Kidney Disease, Lung Disease, Thyroid Problems, Tuberculosis. Social History Former smoker - quit 07/2022, Marital Status - Married, Alcohol Use - Never, Drug Use - No History, Caffeine Use - Never. Medical  History Eyes Patient has history of Cataracts Denies history of Glaucoma, Optic Neuritis Ear/Nose/Mouth/Throat Denies history of Chronic sinus problems/congestion, Middle ear problems Hematologic/Lymphatic Patient has history of Anemia Denies history of Hemophilia, Human Immunodeficiency Virus, Lymphedema, Sickle Cell Disease Cardiovascular Patient has history of Hypertension Denies history of Angina, Arrhythmia, Congestive Heart Failure, Coronary Artery Disease, Deep Vein Thrombosis, Hypotension, Myocardial Infarction, Peripheral Arterial Disease, Peripheral Venous Disease, Phlebitis, Vasculitis Gastrointestinal Denies history of Cirrhosis , Colitis, Crohns, Hepatitis A, Hepatitis B, Hepatitis C Endocrine Patient has history of Type II Diabetes Genitourinary Denies history of End Stage Renal Disease Immunological Denies history of Lupus Erythematosus, Raynauds, Scleroderma Integumentary (Skin) Denies history of History of Burn Musculoskeletal Patient has history of Osteoarthritis Denies history of Gout, Rheumatoid Arthritis, Osteomyelitis Neurologic Denies history of Dementia, Neuropathy, Quadriplegia, Paraplegia, Seizure Disorder Psychiatric Patient has history of Confinement Anxiety Denies history of [name], [name] ([id number]) [id number] Page 5 of 8 Hospitalization/Surgery History - 01/06/2023 osteoporotric vertebral fx. - 06/26/2022 Posterior lumbar fusion 4 level (N/A). - 1996 spinal fusion. - 2015 both hip replacement. - 2018 left shoulder sx. - hysterectomy. Medical A Surgical History Notes nd Constitutional Symptoms (General Health) surgery 06/26/2022 due to a fall and fx fusion- complications hardware exposed, underwent surgery 08/09/2022 revision- surgical site unable to close. Hyperthyroidism Cardiovascular Ventricular septal defect Paroxysmal ventricular tachycardia Gastrointestinal GERD Psychiatric bipolar  anxiety depression Objective Constitutional respirations regular, non-labored and within target range for patient.. Vitals Time Taken: 3:07 PM, Height: 61 in, Weight: 139 lbs, BMI: 26.3, Temperature: 97.8 F, Pulse: 96 bpm, Respiratory Rate: 18 breaths/min, Blood Pressure: 127/84 mmHg, Capillary Blood Glucose: 102 mg/dl. Psychiatric pleasant and cooperative. General Notes: T the thoracic region on the posterior back there is an open wound with hyper granulated tissue and granulation tissue present. Undermining to o the hyper granulated area. No signs of active infection. Integumentary (Hair, Skin) Wound #1 status is Open. Original cause of wound was Surgical Injury. The date acquired was: 08/11/2022. The wound has been in treatment 2 weeks. The wound is located on the Back. The wound measures 1cm length x 1.2cm width x 0.4cm depth; 0.942cm^2 area and 0.377cm^3 volume. There is Fat Layer (Subcutaneous Tissue) exposed. There is no tunneling noted, however, there is undermining starting at 11:00 and ending at 1:00 with a maximum distance of 0.9cm. There is a medium amount of serosanguineous drainage noted. There is large (67-100%) red granulation within the wound bed. There is a small (1-33%) amount of necrotic tissue within the wound bed including Adherent Slough. The periwound skin appearance exhibited: Scarring, Hemosiderin Staining. Periwound temperature was noted as No Abnormality. 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 I used silver nitrate to the hyper granulated areas. I recommended continuing with Medihoney and Aquacel Ag.  Continue aggressive offloading. Follow-up in 2 weeks. Procedures Wound #1 Pre-procedure diagnosis of Wound #1 is an Open Surgical Wound located on the Back . An Chemical Cauterization procedure was performed by Geralyn Corwin, DO. Post procedure Diagnosis Wound #1: Same as Pre-Procedure Plan Follow-up Appointments: Return Appointment in 2 weeks. - Dr Marissa Bussing Anesthetic: [name], [name] ([id number]) [id number] Page 6 of 8 Wound #1 Back: (In clinic) Topical Lidocaine 4% applied to wound bed Bathing/ Shower/ Hygiene: May shower and wash wound with soap and water. - Shower prior to dressing change, otherwise keep dressing dry Off-Loading: Wound #1 Back: Turn and reposition every 2 hours - off load wound area with pillows WOUND #1: - Back Wound Laterality: Cleanser: Vashe 5.8 (oz) Every Other Day/15 Days Discharge Instructions: Cleanse the wound with Vashe prior to applying a clean dressing using gauze sponges, not tissue or cotton balls. Prim Dressing: MediHoney Gel, tube 1.5 (oz) Every Other Day/15 Days ary Discharge Instructions: Apply to wound bed as instructed Prim Dressing: Aquacel Ag Dressing, 2x2 (in/in) Every Other Day/15 Days ary Secondary Dressing: Zetuvit Plus Silicone Border Dressing 4x4 (in/in) Every Other Day/15 Days Discharge Instructions: Apply silicone border over primary dressing as directed. 1. Medihoney and Aquacel Ag 2. Aggressive offloading 3. Follow-up in 2 weeks Electronic Signature(s) Signed: 01/24/2023 4:36:37 PM By: Geralyn Corwin DO Entered By: Geralyn Corwin on 01/24/2023 15:40:32 -------------------------------------------------------------------------------- HxROS Details Patient Name: Date of Service: [name] 01/24/2023 2:45 PM Medical Record Number: [id number] Patient Account Number: [id number] Date of Birth/Sex: Treating RN: [birth year] (68 y.o. F) Primary Care Provider: Aliene Beams Other  Clinician: Referring Provider: Treating Provider/Extender: Marguerite Olea in Treatment: 2 Constitutional Symptoms (General Health) Medical History: Past Medical History Notes: surgery 06/26/2022 due to a fall and fx fusion- complications hardware exposed, underwent surgery 08/09/2022 revision- surgical site unable to close. Hyperthyroidism Eyes Medical History: Positive for: Cataracts Negative for: Glaucoma; Optic Neuritis Ear/Nose/Mouth/Throat Medical History: Negative for: Chronic sinus problems/congestion; Middle ear problems Hematologic/Lymphatic Medical History: Positive for: Anemia Negative for: Hemophilia; Human Immunodeficiency Virus; Lymphedema; Sickle Cell Disease Cardiovascular Medical History: Positive for: Hypertension Negative for: Angina; Arrhythmia; Congestive Heart Failure; Coronary Artery Disease; Deep Vein Thrombosis; Hypotension; Myocardial Infarction; Peripheral Arterial Disease; Peripheral Venous Disease; Phlebitis; Vasculitis Past Medical History Notes: Ventricular septal defect Paroxysmal ventricular tachycardia Gastrointestinal [name], [name] ([id number]) [id number] Page 7 of 8 Medical History: Negative for: Cirrhosis ; Colitis; Crohns; Hepatitis A; Hepatitis B; Hepatitis C Past Medical History Notes: GERD Endocrine Medical History: Positive for: Type II Diabetes Time with diabetes: 10 years Treated with: Oral agents Genitourinary Medical History: Negative for: End Stage Renal Disease Immunological Medical History: Negative for: Lupus Erythematosus; Raynauds; Scleroderma Integumentary (Skin) Medical History: Negative for: History of Burn Musculoskeletal Medical History: Positive for: Osteoarthritis Negative for: Gout; Rheumatoid Arthritis; Osteomyelitis Neurologic Medical History: Negative for: Dementia; Neuropathy; Quadriplegia; Paraplegia; Seizure Disorder Psychiatric Medical  History: Positive for: Confinement Anxiety Negative for: Anorexia/bulimia Past Medical History Notes: bipolar anxiety depression HBO Extended History Items Eyes: Cataracts Immunizations Pneumococcal Vaccine: Received Pneumococcal Vaccination: Yes Received Pneumococcal Vaccination On or After 60th Birthday: Yes Implantable Devices Yes Hospitalization / Surgery History Type of Hospitalization/Surgery 01/06/2023 osteoporotric vertebral fx 06/26/2022 Posterior lumbar fusion 4 level (N/A) 1996 spinal fusion 2015 both hip replacement 2018 left shoulder  sx hysterectomy Family and Social History Cancer: No; Diabetes: No; Heart Disease: No; Hereditary Spherocytosis: No; Hypertension: No; Kidney Disease: No; Lung Disease: No; Seizures: Yes - Mother; Stroke: Yes - Father; Thyroid Problems: No; Tuberculosis: No; Former smoker - quit 07/2022; Marital Status - Married; Alcohol Use: Never; Drug Use: No History; Caffeine Use: Never; Financial Concerns: No; Food, Clothing or Shelter Needs: No; Support System Lacking: No; Transportation Concerns: No Electronic Signature(s) [name], [name] ([id number]) [id number] Page 8 of 8 Signed: 01/24/2023 4:36:37 PM By: Geralyn Corwin DO Entered By: Geralyn Corwin on 01/24/2023 15:39:00 -------------------------------------------------------------------------------- SuperBill Details Patient Name: Date of Service: [name] 01/24/2023 Medical Record Number: [id number] Patient Account Number: [id number] Date of Birth/Sex: Treating RN: 1956/04/05 (67 y.o. 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 E11.622 Type 2 diabetes mellitus with other skin ulcer T81.31XA  Disruption of external operation (surgical) wound, not elsewhere classified, initial encounter S32.009A Unspecified fracture of unspecified lumbar vertebra, initial encounter for closed fracture Facility Procedures : CPT4 Code: [id number] Description: 17250 - CHEM CAUT GRANULATION TISS ICD-10 Diagnosis Description L98.422 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

## 2023-01-26 NOTE — Progress Notes (Signed)
[name], [name] ([id number]) [id number] Page 1 of 7 Visit Report for 01/24/2023 Arrival Information Details Patient Name: Date of Service: TRO [name] 01/24/2023 2:45 PM Medical Record Number: [id number] Patient Account Number: [id number] Date of Birth/Sex: Treating RN: Nov 30, 1955 (67 y.o. [name] Primary Care Lautaro Koral: Aliene Beams Other Clinician: Referring Kayloni Rocco: Treating Violia Knopf/Extender: Marguerite Olea in Treatment: 2 Visit Information History Since Last Visit All ordered tests and consults were completed: Yes Patient Arrived: Wheel Chair Added or deleted any medications: No Arrival Time: 15:04 Any new allergies or adverse reactions: No Accompanied By: daughter Had a fall or experienced change in No Transfer Assistance: None activities of daily living that may affect Patient Identification Verified: Yes risk of falls: Secondary Verification Process Completed: Yes Signs or symptoms of abuse/neglect since last visito No Patient Requires Transmission-Based Precautions: No Hospitalized since last visit: No Patient Has Alerts: No Implantable device outside of the clinic excluding No cellular tissue based products placed in the center since last visit: Pain Present Now: No Electronic Signature(s) Signed: 01/26/2023 4:28:57 PM By: Brenton Grills Entered By: Brenton Grills on 01/24/2023 15:07:43 -------------------------------------------------------------------------------- Encounter Discharge Information Details Patient Name: Date of Service: [name] 01/24/2023 2:45 PM Medical Record Number: [id number] Patient Account Number: [id number] Date of Birth/Sex: Treating RN: 05-Dec-1955 (67 y.o. [name] Primary Care Hedy Garro: Aliene Beams Other Clinician: Referring Jacquelin Krajewski: Treating Kashay Cavenaugh/Extender: Marguerite Olea in Treatment: 2 Encounter Discharge  Information Items Discharge Condition: Stable Ambulatory Status: Wheelchair Discharge Destination: Home Transportation: Private Auto Accompanied By: daughter Schedule Follow-up Appointment: Yes Clinical Summary of Care: Patient Declined Electronic Signature(s) Signed: 01/26/2023 4:28:57 PM By: Brenton Grills Entered By: Brenton Grills on 01/24/2023 15:43:09 Heatherly, Gardiner Ramus ([id number]) [id number] Page 2 of 7 -------------------------------------------------------------------------------- Lower Extremity Assessment Details Patient Name: Date of Service: TRO [name] 01/24/2023 2:45 PM Medical Record Number: [id number] Patient Account Number: [id number] Date of Birth/Sex: Treating RN: 11/16/1955 (67 y.o. [name] Primary Care Kasra Melvin: Aliene Beams Other Clinician: Referring Tranesha Lessner: Treating Teyton Pattillo/Extender: Marguerite Olea in Treatment: 2 Electronic Signature(s) Signed: 01/26/2023 4:28:57 PM By: Brenton Grills Entered By: Brenton Grills on 01/24/2023 15:09:02 -------------------------------------------------------------------------------- Multi Wound Chart Details Patient Name: Date of Service: [name] 01/24/2023 2:45 PM Medical Record Number: [id number] Patient Account Number: [id number] Date of Birth/Sex: Treating RN: 1956-06-21 (67 y.o. F) Primary Care Mustapha Colson: Aliene Beams Other Clinician: Referring Dashon Mcintire: Treating Zaelynn Fuchs/Extender: Marguerite Olea in Treatment: 2 Vital Signs Height(in): 61 Capillary Blood Glucose(mg/dl): 831 Weight(lbs): 517 Pulse(bpm): 96 Body Mass Index(BMI): 26.3 Blood Pressure(mmHg): 127/84 Temperature(F): 97.8 Respiratory Rate(breaths/min): 18 [1:Photos:] [N/A:N/A] Back N/A N/A Wound Location: Surgical Injury N/A N/A Wounding Event: Open Surgical Wound N/A N/A Primary Etiology: Cataracts, Anemia, Hypertension, N/A  N/A Comorbid History: Type II Diabetes, Osteoarthritis, Confinement Anxiety 08/11/2022 N/A N/A Date Acquired: 2 N/A N/A Weeks of Treatment: Open N/A N/A Wound Status: No N/A N/A Wound Recurrence: 1x1.2x0.4 N/A N/A Measurements L x W x D (cm) 0.942 N/A N/A A (cm) : rea 0.377 N/A N/A Volume (cm) : 51.40% N/A N/A % Reduction in A rea: 61.10% N/A N/A % Reduction in Volume: 11 Starting Position 1 (o'clock): 1 Ending Position 1 (o'clock): 0.9 Maximum Distance 1 (cm): Yes N/A N/A Undermining: Full Thickness Without Exposed N/A N/A Classification: Support Structures Medium N/A N/A Exudate Amount: GEORGANNE, SIPLE ([id number]) [id number] Page 3 of 7 Serosanguineous N/A N/A Exudate Type: red, brown N/A  N/A Exudate Color: Large (67-100%) N/A N/A Granulation Amount: Red N/A N/A Granulation Quality: Small (1-33%) N/A N/A Necrotic Amount: Fat Layer (Subcutaneous Tissue): Yes N/A N/A Exposed Structures: Fascia: No Tendon: No Muscle: No Joint: No Bone: No None N/A N/A Epithelialization: Scarring: Yes N/A N/A Periwound Skin Texture: Hemosiderin Staining: Yes N/A N/A Periwound Skin Color: No Abnormality N/A N/A Temperature: Chemical Cauterization N/A N/A Procedures Performed: Treatment Notes Electronic Signature(s) Signed: 01/24/2023 4:36:37 PM By: Geralyn Corwin DO Entered By: Geralyn Corwin on 01/24/2023 15:38:26 -------------------------------------------------------------------------------- Multi-Disciplinary Care Plan Details Patient Name: Date of Service: [name] 01/24/2023 2:45 PM Medical Record Number: [id number] Patient Account Number: [id number] Date of Birth/Sex: Treating RN: 09-10-1955 (67 y.o. [name] Primary Care Waymond Meador: Aliene Beams Other Clinician: Referring Oney Tatlock: Treating Endrit Gittins/Extender: Marguerite Olea in Treatment: 2 Active Inactive Orientation to the  Wound Care Program Nursing Diagnoses: Knowledge deficit related to the wound healing center program Goals: Patient/caregiver will verbalize understanding of the Wound Healing Center Program Date Initiated: 01/07/2023 Target Resolution Date: 01/21/2023 Goal Status: Active Interventions: Provide education on orientation to the wound center Notes: Wound/Skin Impairment Nursing Diagnoses: Impaired tissue integrity Knowledge deficit related to ulceration/compromised skin integrity Goals: Patient/caregiver will verbalize understanding of skin care regimen Date Initiated: 01/07/2023 Target Resolution Date: 02/25/2023 Goal Status: Active Ulcer/skin breakdown will have a volume reduction of 30% by week 4 Date Initiated: 01/07/2023 Target Resolution Date: 02/04/2023 Goal Status: Active Interventions: Assess patient/caregiver ability to obtain necessary supplies Assess patient/caregiver ability to perform ulcer/skin care regimen upon admission and as needed Assess ulceration(s) every visit CAYDEN, RAUTIO ([id number]) [id number] Page 4 of 7 Provide education on ulcer and skin care Notes: Electronic Signature(s) Signed: 01/26/2023 4:28:57 PM By: Brenton Grills Entered By: Brenton Grills on 01/24/2023 15:20:27 -------------------------------------------------------------------------------- Pain Assessment Details Patient Name: Date of Service: [name] 01/24/2023 2:45 PM Medical Record Number: [id number] Patient Account Number: [id number] Date of Birth/Sex: Treating RN: 1956/04/09 (67 y.o. [name] Primary Care Dorrell Mitcheltree: Aliene Beams Other Clinician: Referring Tesneem Dufrane: Treating Virgina Deakins/Extender: Marguerite Olea in Treatment: 2 Active Problems Location of Pain Severity and Description of Pain Patient Has Paino Yes Site Locations Rate the pain. Current Pain Level: 5 Pain Management and Medication Current Pain  Management: Electronic Signature(s) Signed: 01/26/2023 4:28:57 PM By: Brenton Grills Entered By: Brenton Grills on 01/24/2023 15:08:48 -------------------------------------------------------------------------------- Patient/Caregiver Education Details Patient Name: Date of Service: [name] 7/8/2024andnbsp2:45 PM Medical Record Number: [id number] Patient Account Number: [id number] Date of Birth/Gender: Treating RN: [birth year] (67 y.o. [name] Primary Care Physician: Aliene Beams Other Clinician: Referring Physician: Treating Physician/Extender: Marguerite Olea in Treatment: 2 Education [address] [place] ([id number]) [id number] Page 5 of 7 Education Provided To: Patient Education Topics Provided Wound/Skin Impairment: Methods: Explain/Verbal Responses: State content correctly Electronic Signature(s) Signed: 01/26/2023 4:28:57 PM By: Brenton Grills Entered By: Brenton Grills on 01/24/2023 15:20:46 -------------------------------------------------------------------------------- Wound Assessment Details Patient Name: Date of Service: [name] 01/24/2023 2:45 PM Medical Record Number: [id number] Patient Account Number: [id number] Date of Birth/Sex: Treating RN: [birth year] (67 y.o. [name] Primary Care Allante Whitmire: Aliene Beams Other Clinician: Referring Reizel Calzada: Treating Freyja Govea/Extender: Marguerite Olea in Treatment: 2 Wound Status Wound Number: 1 Primary Open Surgical Wound Etiology: Wound Location: Back Wound Open Wounding Event: Surgical Injury Status: Date Acquired: 08/11/2022 Comorbid Cataracts, Anemia, Hypertension, Type II Diabetes, Weeks Of Treatment: 2 History: Osteoarthritis, Confinement Anxiety Clustered  Wound: No Photos Wound Measurements Length: (cm) 1 Width: (cm) 1.2 Depth: (cm) 0.4 Area: (cm) 0.942 Volume: (cm) 0.377 %  Reduction in Area: 51.4% % Reduction in Volume: 61.1% Epithelialization: None Tunneling: No Undermining: Yes Starting Position (o'clock): 11 Ending Position (o'clock): 1 Maximum Distance: (cm) 0.9 Wound Description Classification: Full Thickness Without Exposed Support Structures Exudate Amount: Medium Exudate Type: Serosanguineous Exudate Color: red, brown Foul Odor After Cleansing: No Slough/Fibrino Yes Wound Bed Granulation Amount: Large (67-100%) [address] DAYLENE, VANDENBOSCH ([id number]) [phone].pdf Page 6 of 7 Granulation Quality: Red Fascia Exposed: No Necrotic Amount: Small (1-33%) Fat Layer (Subcutaneous Tissue) Exposed: Yes Necrotic Quality: Adherent Slough Tendon Exposed: No Muscle Exposed: No Joint Exposed: No Bone Exposed: No Periwound Skin Texture Texture Color No Abnormalities Noted: No No Abnormalities Noted: No Scarring: Yes Hemosiderin Staining: Yes Moisture Temperature / Pain No Abnormalities Noted: No Temperature: No Abnormality Treatment Notes Wound #1 (Back) Cleanser Vashe 5.8 (oz) Discharge Instruction: Cleanse the wound with Vashe prior to applying a clean dressing using gauze sponges, not tissue or cotton balls. Peri-Wound Care Topical Primary Dressing MediHoney Gel, tube 1.5 (oz) Discharge Instruction: Apply to wound bed as instructed Aquacel Ag Dressing, 2x2 (in/in) Secondary Dressing Zetuvit Plus Silicone Border Dressing 4x4 (in/in) Discharge Instruction: Apply silicone border over primary dressing as directed. Secured With Compression Wrap Compression Stockings Add-Ons Electronic Signature(s) Signed: 01/26/2023 4:28:57 PM By: Brenton Grills Entered By: Brenton Grills on 01/24/2023 15:19:19 -------------------------------------------------------------------------------- Vitals Details Patient Name: Date of Service: [name] 01/24/2023 2:45 PM Medical Record Number: [id number] Patient  Account Number: [id number] Date of Birth/Sex: Treating RN: May 11, 1956 (67 y.o. [name] Primary Care Nachum Derossett: Aliene Beams Other Clinician: Referring Kathrynn Backstrom: Treating Kristol Almanzar/Extender: Marguerite Olea in Treatment: 2 Vital Signs Time Taken: 15:07 Temperature (F): 97.8 Height (in): 61 Pulse (bpm): 96 Weight (lbs): 139 Respiratory Rate (breaths/min): 18 Body Mass Index (BMI): 26.3 Blood Pressure (mmHg): 127/84 Capillary Blood Glucose (mg/dl): 725 Reference Range: 80 - 120 mg / dl Electronic Signature(s) Signed: 01/26/2023 4:28:57 PM By: Cristal Deer ([id number]) [id number] Page 7 of 7 Entered By: Brenton Grills on 01/24/2023 15:08:23

## 2023-01-30 NOTE — Addendum Note (Signed)
Addended by: Fanny Dance on: 01/30/2023 11:38 PM   Modules accepted: Orders

## 2023-02-03 DIAGNOSIS — Z961 Presence of intraocular lens: Secondary | ICD-10-CM | POA: Insufficient documentation

## 2023-02-07 ENCOUNTER — Encounter (HOSPITAL_BASED_OUTPATIENT_CLINIC_OR_DEPARTMENT_OTHER): Payer: Medicare Other | Admitting: Internal Medicine

## 2023-02-09 ENCOUNTER — Other Ambulatory Visit: Payer: Self-pay

## 2023-02-09 ENCOUNTER — Encounter: Payer: Self-pay | Admitting: Physical Therapy

## 2023-02-09 ENCOUNTER — Ambulatory Visit: Payer: Medicare Other | Attending: Physical Medicine & Rehabilitation | Admitting: Physical Therapy

## 2023-02-09 DIAGNOSIS — R2681 Unsteadiness on feet: Secondary | ICD-10-CM | POA: Insufficient documentation

## 2023-02-09 DIAGNOSIS — M6281 Muscle weakness (generalized): Secondary | ICD-10-CM | POA: Insufficient documentation

## 2023-02-09 DIAGNOSIS — M4626 Osteomyelitis of vertebra, lumbar region: Secondary | ICD-10-CM | POA: Diagnosis not present

## 2023-02-09 DIAGNOSIS — R2689 Other abnormalities of gait and mobility: Secondary | ICD-10-CM | POA: Insufficient documentation

## 2023-02-09 NOTE — Therapy (Signed)
OUTPATIENT PHYSICAL THERAPY NEURO EVALUATION   Patient Name: [name] MRN: [id number] DOB:1956/04/14, 67 y.o., female Today's Date: 02/09/2023   PCP: Aliene Beams, MD REFERRING PROVIDER: Fanny Dance, MD  END OF SESSION:  PT End of Session - 02/09/23 1749     Visit Number 1    Number of Visits 17    Date for PT Re-Evaluation 04/08/23    Authorization Type BCBS Medicare    PT Start Time 1406    PT Stop Time 1447    PT Time Calculation (min) 41 min    Equipment Utilized During Treatment Gait belt   gait belt place low due to thoracic spine wound   Activity Tolerance Patient tolerated treatment well    Behavior During Therapy WFL for tasks assessed/performed             Past Medical History:  Diagnosis Date   Anemia    Anxiety    Arthritis    Bipolar disorder (HCC)    Depression    GERD (gastroesophageal reflux disease)    Heart murmur    "related to VSD"   High cholesterol    History of blood transfusion    "related to OR" (08/19/2016)   History of hiatal hernia    Hypertension    Hyperthyroidism    Mild cognitive impairment 09/06/2018   Paroxysmal ventricular tachycardia (HCC)    Type II diabetes mellitus (HCC)    UTI (urinary tract infection)    being treated with Keflex   Ventricular septal defect    Ventriculitis of brain due to bacteria 11/02/2022   Vertebral fracture, osteoporotic (HCC) 01/06/2023   Past Surgical History:  Procedure Laterality Date   ABDOMINAL HYSTERECTOMY     BACK SURGERY     CARDIAC CATHETERIZATION N/A 06/23/2015   Procedure: Left Heart Cath and Coronary Angiography;  Surgeon: Laurey Morale, MD;  Location: [facility] INVASIVE CV LAB;  Service: Cardiovascular;  Laterality: N/A;   CARDIAC CATHETERIZATION  1960   "VSD was so small; didn't need repaired"   EXAM UNDER ANESTHESIA WITH MANIPULATION OF HIP Right 06/02/2014   dr Jerl Santos   FRACTURE SURGERY     HERNIA REPAIR     HIP CLOSED REDUCTION Right 06/02/2014    Procedure: CLOSED MANIPULATION HIP;  Surgeon: Velna Ochs, MD;  Location: [facility] OR;  Service: Orthopedics;  Laterality: Right;   JOINT REPLACEMENT     JOINT REPLACEMENT     POSTERIOR LUMBAR FUSION 4 LEVEL N/A 06/26/2022   Procedure: Lumbar One To Lumbar Five Posterior Instrumented Fusion;  Surgeon: Jadene Pierini, MD;  Location: [facility] OR;  Service: Neurosurgery;  Laterality: N/A;   REFRACTIVE SURGERY Bilateral    SHOULDER ARTHROSCOPY Right    SHOULDER OPEN ROTATOR CUFF REPAIR Right    SPINAL FUSION  1996   "t10 down to my coccyx   SPINE HARDWARE REMOVAL     TOTAL ABDOMINAL HYSTERECTOMY     TOTAL HIP ARTHROPLASTY Right 05/10/2014   [email or web address]      by dr Cristal Deer olcott   TOTAL KNEE ARTHROPLASTY Left    TOTAL SHOULDER ARTHROPLASTY Left 08/19/2016   Procedure: TOTAL SHOULDER ARTHROPLASTY;  Surgeon: Jones Broom, MD;  Location: [facility] OR;  Service: Orthopedics;  Laterality: Left;  Left total shoulder replacement   Patient Active Problem List   Diagnosis Date Noted   Vertebral fracture, osteoporotic (HCC) 01/06/2023   Ventriculitis of brain due to bacteria 11/02/2022   Anemia of chronic illness 10/12/2022   Bipolar disorder, in  partial remission, most recent episode depressed (HCC) 10/07/2022   Adjustment disorder with mixed anxiety and depressed mood 09/30/2022   Chronic wound 09/25/2022   Hyperglycemia 09/25/2022   Chronic midline thoracic back pain 09/17/2022   Primary insomnia 09/17/2022   Chronic combined systolic and diastolic CHF (congestive heart failure) (HCC) 09/14/2022   Acute blood loss anemia 09/09/2022   Infection of lumbar spine (HCC) 09/03/2022   Malnutrition of moderate degree 08/20/2022   Hardware complicating wound infection (HCC) 08/20/2022   Infection of deep incisional surgical site after procedure 08/20/2022   Aspiration pneumonia of both lungs (HCC) 08/19/2022   Acute respiratory failure with hypoxia (HCC) 08/19/2022   Sepsis (HCC) 08/19/2022   Seizure  (HCC) 08/19/2022   Wound dehiscence 08/09/2022   Delayed surgical wound healing 07/08/2022   Fracture of lumbar spine without cord injury (HCC) 06/26/2022   Lumbar vertebral fracture (HCC) 06/25/2022   Mood disorder (HCC) 09/29/2021   Mild cognitive impairment 09/06/2018   Hyponatremia 02/28/2018   S/P shoulder replacement, left 08/19/2016   Anxiety 10/17/2014   Acid reflux 10/17/2014   BP (high blood pressure) 10/17/2014   Arthritis, degenerative 10/17/2014   Adult hypothyroidism 10/17/2014   UTI (urinary tract infection) 06/03/2014   Fracture of bone adjacent to prosthesis 06/03/2014   Diabetes (HCC) 06/02/2014   Peri-prosthetic fracture of femur following total hip arthroplasty 06/02/2014   Urinary retention 05/13/2014   Chronic pain 05/10/2014   History of hip surgery 05/10/2014   UNSPECIFIED HEART FAILURE 06/24/2010   UNSPECIFIED CONGENITAL DEFECT OF SEPTAL CLOSURE 06/24/2010   TOBACCO ABUSE 03/18/2010   Secondary cardiomyopathy (HCC) 03/18/2010   DM 06/13/2009   HYPERTENSION, UNSPECIFIED 06/13/2009   VENTRICULAR TACHYCARDIA 06/13/2009   VENTRICULAR SEPTAL DEFECT, CONGENITAL 06/13/2009   Diabetes mellitus, type 2 (HCC) 06/13/2009   Essential (primary) hypertension 06/13/2009    ONSET DATE: 01/30/2023 (MD referral)  REFERRING DIAG: M46.26 (ICD-10-CM) - Infection of lumbar spine (HCC)   THERAPY DIAG:  Muscle weakness (generalized)  Unsteadiness on feet  Other abnormalities of gait and mobility  Rationale for Evaluation and Treatment: Rehabilitation  SUBJECTIVE:                                                                                                                                                                                             SUBJECTIVE STATEMENT: Fell after Thanksgiving and fractured L3; had fusion with post-op removal of screws; had bout of meningitis, ventriculitis, and pneumonia.  Had hospital course in January, then was in CIR, discharged  late March to home.  Home health PT finished about 1 month ago.  Pt accompanied by: family  member and daughter  PERTINENT HISTORY: Hx of chronic wound (thoracic spine area) having had a postoperative surgery complicated by wound infection with CSF leak and ventriculitis with concern for hardware potentially being involved; osteomyelitis; spinal compression fracture (T10)   67 y.o. female with hx of scoliosis surgery with extensive lower thoracic and lumbar fusion roughly 16yrs ago, she suffered ground level fall in late November, where she fractures through all three columns of L3, thus went to OR on 12/9 for stabilization, new  posterolateral instrumentation fusion on June 26, 2022 complicated by Wound dehiscence with exposed hardware and unstable lumbar spine fracture status post revision of lumbar wound with removal of L1-3 4 and 5 lumbar screws and rod and removal of right L1 screw and revision of L1-L5 posterior instrumented fusion on August 09, 2022 and now concern for postoperative wound infection and ventriculitis. She had complicated protracted course in the icu, but eventually was discharged to CIR on vancomycin and cefepime through 3/14 for ventriculitis and hw complicating wound infection.    Mri of brain on 09/29/22- resolution of ventriculitis.  PAIN:  Are you having pain? Yes: NPRS scale: 4-5/10 Pain location: R hip Pain description: achy Aggravating factors: standing  Relieving factors: sitting  PRECAUTIONS: Back, Fall, and Other: hx of osteoporosis and fractures ; avoid twisting; avoid excess bending; is to log roll for bed mobility; (thoracic) wound being followed by wound care.  RED FLAGS: None   WEIGHT BEARING RESTRICTIONS: No  FALLS: Has patient fallen in last 6 months? Yes. Number of falls 1  LIVING ENVIRONMENT: Lives with: lives with their spouse; has caregiver at home; ramp to enter; one level home Lives in: House/apartment Stairs: Yes: Internal: 1 steps; none;  family's homes have steps to get in Has following equipment at home: Dan Humphreys - 2 wheeled, Wheelchair (manual), Shower bench, and bed side commode *has used shoe with lift in the distant past for Leg length discrepancy PLOF: Independent using cane  PATIENT GOALS: Wants to work on balance, walking, stairs, to get in and out of a car betterto be independent again  OBJECTIVE:   DIAGNOSTIC FINDINGS: CT lumbar spine 12-26-22 IMPRESSION: 1. Pronounced chronic osteopenia. Prior decompression and fusion throughout the lumbar spine.   2. L3 superior endplate/superior body non-acute fracture AND subtle associated horizontal fracture through the chronic posterior element bone mass there (series 9, image 53 today). But no displacement over this series of exams. 3. Difficult to exclude nondisplaced S3/S4 sacral fracture, with mild presacral stranding there.   4. But no other acute osseous abnormality identified. And elsewhere the chronic T12 through sacral ankylosis/arthrodesis appears intact.   5. T10 compression fracture is detailed separately today on CT Thoracic Spine.  COGNITION: Overall cognitive status: Within functional limits for tasks assessed   SENSATION: Light touch: WFL Reports numbness bottom of L foot, sometimes into lateral calf    POSTURE: rounded shoulders, weight shift right, and lateral lean towards R, L shoulder lower than R  LOWER EXTREMITY ROM:   WFL except decreased full Active R hip flexion  LOWER EXTREMITY MMT:    MMT Right Eval Left Eval  Hip flexion 3- 3  Hip extension    Hip abduction    Hip adduction    Hip internal rotation    Hip external rotation    Knee flexion 3+ 3+  Knee extension 3+ 4  Ankle dorsiflexion 3+ 3+  Ankle plantarflexion    Ankle inversion    Ankle eversion    (Blank rows =  not tested)   TRANSFERS: Assistive device utilized: Environmental consultant - 2 wheeled  Sit to stand: CGA Stand to sit: CGA  GAIT: Gait pattern: step to pattern,  step through pattern, decreased step length- Right, decreased step length- Left, Left hip hike, and knee flexed in stance- Left Distance walked: 35 ft Assistive device utilized: Walker - 2 wheeled Level of assistance: CGA Comments: stops x 1 due to fatigue  FUNCTIONAL TESTS:  10 meter walk test: attempted; unable to complete:  she ambulates 30 ft in 93.6 sec (0.32 ft/sec) Pt stands near walker, no UE support with supervision x 20-30 seconds  TODAY'S TREATMENT:                                                                                                                              DATE: 02/09/2023    PATIENT EDUCATION: Education details: Eval results, POC Person educated: Patient and Child(ren) Education method: Explanation Education comprehension: verbalized understanding  HOME EXERCISE PROGRAM: Not yet initiated  GOALS: Goals reviewed with patient? Yes  SHORT TERM GOALS: Target date: 03/11/2023  Pt will be independent with HEP for improved strength, balance, gait. Baseline: Goal status: INITIAL  2.  Pt will perform 5x sit<>stand test in 5 seconds less than baseline measure, to demo improved functional strength. Baseline: TBA Goal status: INITIAL  3.  Gait velocity to improve to 1 ft/sec for improved gait efficiency and safety. Baseline: 0.32 ft/sec Goal status: INITIAL   LONG TERM GOALS: Target date: 04/08/2023  Pt will be independent with HEP for improved strength, balance, gait, decreased hip pain. Baseline:  Goal status: INITIAL  2.  Pt will improve 5x sit<>stand to less than or equal to 12 sec to demonstrate improved functional strength and transfer efficiency. Baseline:  Goal status: INITIAL  3.  Pt will improve Berg score by at least 10 points from baseline to decrease fall risk. Baseline: TBA Goal status: INITIAL  4.  Gait velocity to improve to 1.8 ft/sec for improved gait efficiency and safety Baseline:  Goal status: INITIAL  5.  Pt will negotiate  at least 3 steps with one handrail and min guard for improved negotiation into family's homes. Baseline:  Goal status: INITIAL  6.  Pt will ambulate at least 500 ft with least restrictive device for improved community gait. Baseline:  Goal status: INITIAL  ASSESSMENT:  CLINICAL IMPRESSION: [name] who was seen today for physical therapy evaluation and treatment s/p spinal infection. She has extensive history since November 2023, where she fell and fractured L3.  She had surgery, with subsequent hardware removal, meningitis, ventriculitis, and pneumonia.  She was hospitalized from January 2024-March 2024 and participated in CIR therapies.  She has had HHPT, which was discharged about one month ago.  She presents today with decreased lower extremity strength, decreased balance, decreased gait independence, abnormal posture.  She has history of scoliosis, osteoporosis, previous fractures, and leg length discrepancy.  Prior  to hospitalizations, she was independent using cane.  She demonstrates increased fall risk with gait velocity score of 0.32 ft/sec.  She will benefit from skilled PT to address the above stated deficits for decreased fall risk and improved overall functional mobility, return to independence.  OBJECTIVE IMPAIRMENTS: Abnormal gait, decreased balance, decreased mobility, difficulty walking, decreased strength, impaired flexibility, postural dysfunction, and pain.   ACTIVITY LIMITATIONS: bending, standing, stairs, transfers, bathing, toileting, dressing, hygiene/grooming, and locomotion level  PARTICIPATION LIMITATIONS: driving, shopping, and community activity  PERSONAL FACTORS: 3+ comorbidities: See PMH  are also affecting patient's functional outcome.   REHAB POTENTIAL: Good  CLINICAL DECISION MAKING: Evolving/moderate complexity  EVALUATION COMPLEXITY: Moderate  PLAN:  PT FREQUENCY: 2x/week  PT DURATION: 8 weeks plus eval week  PLANNED  INTERVENTIONS: Therapeutic exercises, Therapeutic activity, Neuromuscular re-education, Balance training, Gait training, Patient/Family education, Self Care, Stair training, Orthotic/Fit training, and DME instructions  PLAN FOR NEXT SESSION: Initiate HEP for lower extremity strengthening, perform Berg and 5x sit<>Stand tests, gait training   Kingdavid Leinbach W., PT 02/09/2023, 6:12 PM  Desere Gwin Bernie Covey, PT 02/09/23 6:13 PM Phone: [phone] Fax: [phone]

## 2023-02-10 ENCOUNTER — Encounter: Payer: Self-pay | Admitting: Cardiology

## 2023-02-10 ENCOUNTER — Ambulatory Visit: Payer: Medicare Other | Admitting: Physical Therapy

## 2023-02-10 ENCOUNTER — Ambulatory Visit: Payer: Medicare Other | Attending: Cardiology | Admitting: Cardiology

## 2023-02-10 VITALS — BP 114/72 | HR 102 | Ht 61.0 in | Wt 138.4 lb

## 2023-02-10 DIAGNOSIS — I10 Essential (primary) hypertension: Secondary | ICD-10-CM | POA: Diagnosis not present

## 2023-02-10 DIAGNOSIS — I2510 Atherosclerotic heart disease of native coronary artery without angina pectoris: Secondary | ICD-10-CM | POA: Diagnosis not present

## 2023-02-10 DIAGNOSIS — Q210 Ventricular septal defect: Secondary | ICD-10-CM

## 2023-02-10 DIAGNOSIS — I5042 Chronic combined systolic (congestive) and diastolic (congestive) heart failure: Secondary | ICD-10-CM

## 2023-02-10 DIAGNOSIS — I4729 Other ventricular tachycardia: Secondary | ICD-10-CM | POA: Diagnosis not present

## 2023-02-10 MED ORDER — EMPAGLIFLOZIN 10 MG PO TABS: 10.0000 mg | ORAL_TABLET | Freq: Every day | ORAL | 3 refills | Status: AC

## 2023-02-10 NOTE — Assessment & Plan Note (Addendum)
2016 LHC with mild, nonobstructive CAD. No anginal symptoms. Continue with risk factor management including DM, hypertension, hyperlipiemia. Daily ASA 81mg  is reasonable. LDL has not been checked in a couple of years, patient seeing PCP today and believes labs will be checked there. Discussed LDL goal <70. Continue Crestor 20mg .

## 2023-02-10 NOTE — Progress Notes (Signed)
Cardiology Office Note:   Date:  02/10/2023  ID:  RAYNE COWDREY, DOB 10/17/1955, MRN [id number] PCP: Aliene Beams, MD  [facility] Providers Cardiologist:  Donato Schultz, MD    History of Present Illness:   [name] is a 67 y.o. female with history of small membranous VSD, HFmrEF (40-45% per limited echocardiogram 08/20/22), nonobstructive CAD, ventricular tachycardia s/p ablation, bipolar depression, tobacco use, pathologic fracture of lumbar spine complicated by wound infection with CSF leak and ventriculitis, DM type II, dementia.  Patient presents for annual follow up today. Since her last visit, patient with prolonged hospitalization, earlier this year. Per discharge summary:  "Patient was admitted 1/22 for exposed hardware and wound breakdown after prior repair of a 3 column fracture of prior scoliosis construct. She was taken to the OR on 1/22 for revision of her wound, removal of the left sided hardware, as well as the right L1 screw and revision of the posterolateral fusion. Post-operatively, she unfortunately had a long complicated course. She required transfer to the ICU for respiratory failure with concern for aspiration, presumed due to altered mental status, CT PE was negative, CTH neg, CT L-spine was unremarkable, MRI brain did show some bilateral occipital horn diffusion changes concerning for possible ventriculitis. Her wound did have some drainage and continue dslow healing, wound care was consulted and a wound vac was placed. She was intubated, CT PE did show likely aspiration pneumonia, she was started on broad spectrum antibiotics. Co-management was performed with the assistance of CCM and medicine while in the unit and floor, respectively. She failed extubation and a bedside trach was performed, was able to be weaned to a speaking valve and passed a swallow eval. She continued to progress well after trach and was able to transfer out of the unit. She was  seen by PT/OT, who recommended CIR and was accepted for transfer CIR on 2/16."   A limited echocardiogram during this admission showed severe reduction of RV systolic function with severe enlargement. This is a notable change from prior echocardiogram in 2020 that did not note any RV dysfunction.   Since leaving the hospital, patient reports that she continues to make slow but study recovery process. She is still primarily in a wheelchair but has been working with physical therapy to walk again. Generally has been without cardiac symptoms though does reports very occasional episodes of dyspnea lasting up to one hour. Has not been able to identify a trigger but does note that these episodes happen at night and can be felt both when supine and sitting up. She denies palpitations, chest pain, lightheadedness/dizziness with this dyspnea.   Reports no chest pain, lower extremity edema, palpitations, melena, hematuria, hemoptysis, diaphoresis, weakness, presyncope, syncope, orthopnea, and PND.   Studies Reviewed:    EKG:   EKG Interpretation Date/Time:  Thursday February 10 2023 10:28:20 EDT Ventricular Rate:  97 PR Interval:  146 QRS Duration:  110 QT Interval:  388 QTC Calculation: 492 R Axis:   -29  Text Interpretation: Normal sinus rhythm Incomplete left bundle branch block Moderate voltage criteria for LVH, may be normal variant ( R in aVL , Cornell product ) Nonspecific T wave abnormality Prolonged QT When compared with ECG of 25-Jun-2022 22:00, PREVIOUS ECG IS PRESENT Confirmed by Perlie Gold [phone]) on 02/10/2023 10:33:21 AM      Risk Assessment/Calculations:              Physical Exam:   VS:  BP 114/72  Pulse (!) 102   Ht 5\' 1"  (1.549 m)   Wt 138 lb 6.4 oz (62.8 kg)   SpO2 99%   BMI 26.15 kg/m    Wt Readings from Last 3 Encounters:  02/10/23 138 lb 6.4 oz (62.8 kg)  12/10/22 141 lb 6.4 oz (64.1 kg)  12/09/22 139 lb (63 kg)     Physical Exam Vitals reviewed.   Constitutional:      Appearance: Normal appearance.  Cardiovascular:     Rate and Rhythm: Tachycardia present.     Pulses: Normal pulses.     Heart sounds: Murmur (4/6 systolic murmur over apex) heard.  Pulmonary:     Effort: Pulmonary effort is normal.     Breath sounds: Normal breath sounds.  Abdominal:     General: Abdomen is flat.  Musculoskeletal:        General: Normal range of motion.     Right lower leg: No edema.     Left lower leg: No edema.  Skin:    General: Skin is warm and dry.     Capillary Refill: Capillary refill takes less than 2 seconds.  Neurological:     General: No focal deficit present.     Mental Status: She is alert and oriented to person, place, and time.  Psychiatric:        Mood and Affect: Mood normal.        Behavior: Behavior normal.        Thought Content: Thought content normal.        Judgment: Judgment normal.      ASSESSMENT AND PLAN:   VENTRICULAR SEPTAL DEFECT, CONGENITAL A limited echocardiogram during February admission showed severe reduction of RV systolic function with severe enlargement. This is a notable change from prior echocardiogram in 2020 that did not note any RV dysfunction. Given that this study was limited by patient condition/mechanical ventilator, will repeat.   Nonobstructive atherosclerosis of coronary artery 2016 LHC with mild, nonobstructive CAD. No anginal symptoms. Continue with risk factor management including DM, hypertension, hyperlipiemia. Daily ASA 81mg  is reasonable. LDL has not been checked in a couple of years, patient seeing PCP today and believes labs will be checked there. Discussed LDL goal <70. Continue Crestor 20mg .   Chronic combined systolic and diastolic CHF (congestive heart failure) (HCC) LVEF 40-45% per limited echo obtained February 2024 in setting of complex admission with wound infection, CSF leak, and ventriculitis. Ventricular function slightly decreased from prior study and RV dysfunction  notably worse with RV enlargement.  Patient functionally limited with weakness secondary to complex admission. NYHA class I-II symptoms. As above, repeat echocardiogram Optimize GDMT with addition of Jardiance 10mg . Continue Losartan 25mg . Patient with low normal BP, not likely to tolerate Entresto at this time. Given complexity of current medication list, will defer initiation of spironolactone. Ultimately I do think this is a great option for her HF management given chronically low potassium as well. Will plan to add at next follow up.  Essential (primary) hypertension Blood pressure is excellent today, 114/72.   Continue Coreg 25mg  BID Continue Losartan 25mg  every day  VENTRICULAR TACHYCARDIA Patient without recurrent palpitations or evidence of arrhythmia s/p ablation. Continue Coreg 25mg  BID.           Signed, Perlie Gold, PA-C

## 2023-02-10 NOTE — Assessment & Plan Note (Signed)
Blood pressure is excellent today, 114/72.   Continue Coreg 25mg  BID Continue Losartan 25mg  every day

## 2023-02-10 NOTE — Assessment & Plan Note (Addendum)
LVEF 40-45% per limited echo obtained February 2024 in setting of complex admission with wound infection, CSF leak, and ventriculitis. Ventricular function slightly decreased from prior study and RV dysfunction notably worse with RV enlargement.  Patient functionally limited with weakness secondary to complex admission. NYHA class I-II symptoms. As above, repeat echocardiogram Optimize GDMT with addition of Jardiance 10mg . Continue Losartan 25mg . Patient with low normal BP, not likely to tolerate Entresto at this time. Given complexity of current medication list, will defer initiation of spironolactone. Ultimately I do think this is a great option for her HF management given chronically low potassium as well. Will plan to add at next follow up.

## 2023-02-10 NOTE — Patient Instructions (Signed)
Medication Instructions:  Your physician has recommended you make the following change in your medication:  START JARDIANCE 10 MG DAILY  *If you need a refill on your cardiac medications before your next appointment, please call your pharmacy*   Lab Work: NONE If you have labs (blood work) drawn today and your tests are completely normal, you will receive your results only by: MyChart Message (if you have MyChart) OR A paper copy in the mail If you have any lab test that is abnormal or we need to change your treatment, we will call you to review the results.   Testing/Procedures: Your physician has requested that you have an echocardiogram. Echocardiography is a painless test that uses sound waves to create images of your heart. It provides your doctor with information about the size and shape of your heart and how well your heart's chambers and valves are working. This procedure takes approximately one hour. There are no restrictions for this procedure. Please do NOT wear cologne, perfume, aftershave, or lotions (deodorant is allowed). Please arrive 15 minutes prior to your appointment time.    Follow-Up: At [facility], you and your health needs are our priority.  As part of our continuing mission to provide you with exceptional heart care, we have created designated Provider Care Teams.  These Care Teams include your primary Cardiologist (physician) and Advanced Practice Providers (APPs -  Physician Assistants and Nurse Practitioners) who all work together to provide you with the care you need, when you need it.  We recommend signing up for the patient portal called "MyChart".  Sign up information is provided on this After Visit Summary.  MyChart is used to connect with patients for Virtual Visits (Telemedicine).  Patients are able to view lab/test results, encounter notes, upcoming appointments, etc.  Non-urgent messages can be sent to your provider as well.   To learn more  about what you can do with MyChart, go to [email or web address].    Your next appointment:   2 month(s)  Provider:   Perlie Gold, PA-C   Other Instructions Echocardiogram An echocardiogram is a test that uses sound waves to make images of your heart. This way of making images is often called ultrasound. The images from this test can help find out many things about your heart, including: The size and shape of your heart. The strength of your heart muscle and how well it's working. The size, thickness, and movement of your heart's walls. How your heart valves are working. Problems such as: A tumor or a growth from an infection around the heart valves. Areas of heart muscle that aren't working well because of poor blood flow or injury from a heart attack. An aneurysm. This is a weak or damaged part of an artery wall. An artery is a blood vessel. Tell a health care provider about: Any allergies you have. All medicines you're taking, including vitamins, herbs, eye drops, creams, and over-the-counter medicines. Any bleeding problems you have. Any surgeries you've had. Any medical problems you have. Whether you're pregnant or may be pregnant. What are the risks? Your health care provider will talk with you about risks. These may include an allergic reaction to IV dye that may be used during the test. What happens before the test? You don't need to do anything to get ready for this test. You may eat and drink normally. What happens during the test?  You'll take off your clothes from the waist up and put on a hospital gown.  Sticky patches called electrodes may be placed on your chest. These will be connected to a machine that monitors your heart rate and rhythm. You'll lie down on a table for the exam. A wand covered in gel will be moved over your chest. Sound waves from the wand will go to your heart and bounce back--or "echo" back. The sound waves will go to a computer that uses them  to make images of your heart. The images can be viewed on a monitor. The images will also be recorded on the computer so your provider can look at them later. You may be asked to change positions or hold your breath for a short time. This makes it easier to get different views or better views of your heart. In some cases, you may be given a dye through an IV. The IV is put into one of your veins. This dye can make the areas of your heart easier to see. The procedure may vary among providers and hospitals. What can I expect after the test? You may return to your normal diet, activities, and medicines unless your provider tells you not to. If an IV was placed for the test, it will be removed. It's up to you to get the results of your test. Ask your provider, or the department that's doing the test, when your results will be ready. This information is not intended to replace advice given to you by your health care provider. Make sure you discuss any questions you have with your health care provider. Document Revised: 09/03/2022 Document Reviewed: 09/03/2022 Elsevier Patient Education  2024 [employer].

## 2023-02-10 NOTE — Assessment & Plan Note (Signed)
Patient without recurrent palpitations or evidence of arrhythmia s/p ablation. Continue Coreg 25mg  BID.

## 2023-02-10 NOTE — Assessment & Plan Note (Signed)
A limited echocardiogram during February admission showed severe reduction of RV systolic function with severe enlargement. This is a notable change from prior echocardiogram in 2020 that did not note any RV dysfunction. Given that this study was limited by patient condition/mechanical ventilator, will repeat.

## 2023-02-14 ENCOUNTER — Ambulatory Visit (HOSPITAL_BASED_OUTPATIENT_CLINIC_OR_DEPARTMENT_OTHER): Payer: Medicare Other | Admitting: Internal Medicine

## 2023-02-16 ENCOUNTER — Ambulatory Visit: Payer: Medicare Other

## 2023-02-16 DIAGNOSIS — R2689 Other abnormalities of gait and mobility: Secondary | ICD-10-CM

## 2023-02-16 DIAGNOSIS — R2681 Unsteadiness on feet: Secondary | ICD-10-CM

## 2023-02-16 DIAGNOSIS — M6281 Muscle weakness (generalized): Secondary | ICD-10-CM | POA: Diagnosis not present

## 2023-02-16 NOTE — Therapy (Signed)
OUTPATIENT PHYSICAL THERAPY NEURO TREATMENT   Patient Name: [name] MRN: [id number] DOB:1955/11/04, 67 y.o., female Today's Date: 02/16/2023   PCP: Aliene Beams, MD REFERRING PROVIDER: Fanny Dance, MD  END OF SESSION:  PT End of Session - 02/16/23 1617     Visit Number 2    Number of Visits 17    Date for PT Re-Evaluation 04/08/23    Authorization Type BCBS Medicare    PT Start Time 1615    PT Stop Time 1700    PT Time Calculation (min) 45 min    Equipment Utilized During Treatment Gait belt   gait belt place low due to thoracic spine wound   Activity Tolerance Patient tolerated treatment well    Behavior During Therapy WFL for tasks assessed/performed             Past Medical History:  Diagnosis Date   Anemia    Anxiety    Arthritis    Bipolar disorder (HCC)    Depression    GERD (gastroesophageal reflux disease)    Heart murmur    "related to VSD"   High cholesterol    History of blood transfusion    "related to OR" (08/19/2016)   History of hiatal hernia    Hypertension    Hyperthyroidism    Mild cognitive impairment 09/06/2018   Paroxysmal ventricular tachycardia (HCC)    Type II diabetes mellitus (HCC)    UTI (urinary tract infection)    being treated with Keflex   Ventricular septal defect    Ventriculitis of brain due to bacteria 11/02/2022   Vertebral fracture, osteoporotic (HCC) 01/06/2023   Past Surgical History:  Procedure Laterality Date   ABDOMINAL HYSTERECTOMY     BACK SURGERY     CARDIAC CATHETERIZATION N/A 06/23/2015   Procedure: Left Heart Cath and Coronary Angiography;  Surgeon: Laurey Morale, MD;  Location: [facility] INVASIVE CV LAB;  Service: Cardiovascular;  Laterality: N/A;   CARDIAC CATHETERIZATION  1960   "VSD was so small; didn't need repaired"   EXAM UNDER ANESTHESIA WITH MANIPULATION OF HIP Right 06/02/2014   dr Jerl Santos   FRACTURE SURGERY     HERNIA REPAIR     HIP CLOSED REDUCTION Right 06/02/2014    Procedure: CLOSED MANIPULATION HIP;  Surgeon: Velna Ochs, MD;  Location: [facility] OR;  Service: Orthopedics;  Laterality: Right;   JOINT REPLACEMENT     JOINT REPLACEMENT     POSTERIOR LUMBAR FUSION 4 LEVEL N/A 06/26/2022   Procedure: Lumbar One To Lumbar Five Posterior Instrumented Fusion;  Surgeon: Jadene Pierini, MD;  Location: [facility] OR;  Service: Neurosurgery;  Laterality: N/A;   REFRACTIVE SURGERY Bilateral    SHOULDER ARTHROSCOPY Right    SHOULDER OPEN ROTATOR CUFF REPAIR Right    SPINAL FUSION  1996   "t10 down to my coccyx   SPINE HARDWARE REMOVAL     TOTAL ABDOMINAL HYSTERECTOMY     TOTAL HIP ARTHROPLASTY Right 05/10/2014   [place]      by dr Cristal Deer olcott   TOTAL KNEE ARTHROPLASTY Left    TOTAL SHOULDER ARTHROPLASTY Left 08/19/2016   Procedure: TOTAL SHOULDER ARTHROPLASTY;  Surgeon: Jones Broom, MD;  Location: [facility] OR;  Service: Orthopedics;  Laterality: Left;  Left total shoulder replacement   Patient Active Problem List   Diagnosis Date Noted   Nonobstructive atherosclerosis of coronary artery 02/10/2023   Vertebral fracture, osteoporotic (HCC) 01/06/2023   Ventriculitis of brain due to bacteria 11/02/2022   Anemia of  chronic illness 10/12/2022   Bipolar disorder, in partial remission, most recent episode depressed (HCC) 10/07/2022   Adjustment disorder with mixed anxiety and depressed mood 09/30/2022   Chronic wound 09/25/2022   Hyperglycemia 09/25/2022   Chronic midline thoracic back pain 09/17/2022   Primary insomnia 09/17/2022   Chronic combined systolic and diastolic CHF (congestive heart failure) (HCC) 09/14/2022   Acute blood loss anemia 09/09/2022   Infection of lumbar spine (HCC) 09/03/2022   Malnutrition of moderate degree 08/20/2022   Hardware complicating wound infection (HCC) 08/20/2022   Infection of deep incisional surgical site after procedure 08/20/2022   Aspiration pneumonia of both lungs (HCC) 08/19/2022   Acute respiratory failure with  hypoxia (HCC) 08/19/2022   Sepsis (HCC) 08/19/2022   Seizure (HCC) 08/19/2022   Wound dehiscence 08/09/2022   Delayed surgical wound healing 07/08/2022   Fracture of lumbar spine without cord injury (HCC) 06/26/2022   Lumbar vertebral fracture (HCC) 06/25/2022   Mood disorder (HCC) 09/29/2021   Mild cognitive impairment 09/06/2018   Hyponatremia 02/28/2018   S/P shoulder replacement, left 08/19/2016   Anxiety 10/17/2014   Acid reflux 10/17/2014   BP (high blood pressure) 10/17/2014   Arthritis, degenerative 10/17/2014   Adult hypothyroidism 10/17/2014   UTI (urinary tract infection) 06/03/2014   Fracture of bone adjacent to prosthesis 06/03/2014   Diabetes (HCC) 06/02/2014   Peri-prosthetic fracture of femur following total hip arthroplasty 06/02/2014   Urinary retention 05/13/2014   Chronic pain 05/10/2014   History of hip surgery 05/10/2014   UNSPECIFIED HEART FAILURE 06/24/2010   UNSPECIFIED CONGENITAL DEFECT OF SEPTAL CLOSURE 06/24/2010   TOBACCO ABUSE 03/18/2010   Secondary cardiomyopathy (HCC) 03/18/2010   DM 06/13/2009   HYPERTENSION, UNSPECIFIED 06/13/2009   VENTRICULAR TACHYCARDIA 06/13/2009   VENTRICULAR SEPTAL DEFECT, CONGENITAL 06/13/2009   Diabetes mellitus, type 2 (HCC) 06/13/2009   Essential (primary) hypertension 06/13/2009    ONSET DATE: 01/30/2023 (MD referral)  REFERRING DIAG: M46.26 (ICD-10-CM) - Infection of lumbar spine (HCC)   THERAPY DIAG:  Muscle weakness (generalized)  Unsteadiness on feet  Other abnormalities of gait and mobility  Rationale for Evaluation and Treatment: Rehabilitation  SUBJECTIVE:                                                                                                                                                                                             SUBJECTIVE STATEMENT: Been to other Dr. Appointment today  Pt accompanied by: family member and daughter  PERTINENT HISTORY: Hx of chronic wound (thoracic  spine area) having had a postoperative surgery complicated by wound infection with CSF leak and ventriculitis with concern for  hardware potentially being involved; osteomyelitis; spinal compression fracture (T10)   67 y.o. female with hx of scoliosis surgery with extensive lower thoracic and lumbar fusion roughly 61yrs ago, she suffered ground level fall in late November, where she fractures through all three columns of L3, thus went to OR on 12/9 for stabilization, new  posterolateral instrumentation fusion on June 26, 2022 complicated by Wound dehiscence with exposed hardware and unstable lumbar spine fracture status post revision of lumbar wound with removal of L1-3 4 and 5 lumbar screws and rod and removal of right L1 screw and revision of L1-L5 posterior instrumented fusion on August 09, 2022 and now concern for postoperative wound infection and ventriculitis. She had complicated protracted course in the icu, but eventually was discharged to CIR on vancomycin and cefepime through 3/14 for ventriculitis and hw complicating wound infection.    Mri of brain on 09/29/22- resolution of ventriculitis.  PAIN:  Are you having pain? Yes: NPRS scale: 4-5/10 Pain location: R hip Pain description: achy Aggravating factors: standing  Relieving factors: sitting  PRECAUTIONS: Back, Fall, and Other: hx of osteoporosis and fractures ; avoid twisting; avoid excess bending; is to log roll for bed mobility; (thoracic) wound being followed by wound care.  RED FLAGS: None   WEIGHT BEARING RESTRICTIONS: No  FALLS: Has patient fallen in last 6 months? Yes. Number of falls 1  LIVING ENVIRONMENT: Lives with: lives with their spouse; has caregiver at home; ramp to enter; one level home Lives in: House/apartment Stairs: Yes: Internal: 1 steps; none; family's homes have steps to get in Has following equipment at home: Dan Humphreys - 2 wheeled, Wheelchair (manual), Shower bench, and bed side commode *has used shoe  with lift in the distant past for Leg length discrepancy PLOF: Independent using cane  PATIENT GOALS: Wants to work on balance, walking, stairs, to get in and out of a car betterto be independent again  OBJECTIVE:   TODAY'S TREATMENT: 02/16/23 Activity Comments  5xSTS 17 sec w/ BUE push-off  Berg Balance Test 33/56  LAQ 2x10 3#  Seated hamstring curls 2x10 Red loop  Seated hip abd 2x10 Red loop        DIAGNOSTIC FINDINGS: CT lumbar spine Dec 04, 2022 IMPRESSION: 1. Pronounced chronic osteopenia. Prior decompression and fusion throughout the lumbar spine.   2. L3 superior endplate/superior body non-acute fracture AND subtle associated horizontal fracture through the chronic posterior element bone mass there (series 9, image 53 today). But no displacement over this series of exams. 3. Difficult to exclude nondisplaced S3/S4 sacral fracture, with mild presacral stranding there.   4. But no other acute osseous abnormality identified. And elsewhere the chronic T12 through sacral ankylosis/arthrodesis appears intact.   5. T10 compression fracture is detailed separately today on CT Thoracic Spine.  COGNITION: Overall cognitive status: Within functional limits for tasks assessed   SENSATION: Light touch: WFL Reports numbness bottom of L foot, sometimes into lateral calf    POSTURE: rounded shoulders, weight shift right, and lateral lean towards R, L shoulder lower than R  LOWER EXTREMITY ROM:   WFL except decreased full Active R hip flexion  LOWER EXTREMITY MMT:    MMT Right Eval Left Eval  Hip flexion 3- 3  Hip extension    Hip abduction    Hip adduction    Hip internal rotation    Hip external rotation    Knee flexion 3+ 3+  Knee extension 3+ 4  Ankle dorsiflexion 3+ 3+  Ankle plantarflexion  Ankle inversion    Ankle eversion    (Blank rows = not tested)   TRANSFERS: Assistive device utilized: Environmental consultant - 2 wheeled  Sit to stand: CGA Stand to sit:  CGA  GAIT: Gait pattern: step to pattern, step through pattern, decreased step length- Right, decreased step length- Left, Left hip hike, and knee flexed in stance- Left Distance walked: 35 ft Assistive device utilized: Walker - 2 wheeled Level of assistance: CGA Comments: stops x 1 due to fatigue  FUNCTIONAL TESTS:  10 meter walk test: attempted; unable to complete:  she ambulates 30 ft in 93.6 sec (0.32 ft/sec) Pt stands near walker, no UE support with supervision x 20-30 seconds  TODAY'S TREATMENT:                                                                                                                              DATE: 02/09/2023    PATIENT EDUCATION: Education details: Eval results, POC Person educated: Patient and Child(ren) Education method: Explanation Education comprehension: verbalized understanding  HOME EXERCISE PROGRAM: Access Code: [email or web address] URL: [email or web address] Date: 02/16/2023 Prepared by: Shary Decamp  Exercises - Seated Long Arc Quad with Ankle Weight  - 1 x daily - 7 x weekly - 3 sets - 10 reps - Seated Hamstring Curl with Anchored Resistance  - 1 x daily - 7 x weekly - 3 sets - 10 reps - Seated Hip Abduction with Resistance  - 1 x daily - 7 x weekly - 3 sets - 10 reps  GOALS: Goals reviewed with patient? Yes  SHORT TERM GOALS: Target date: 03/11/2023  Pt will be independent with HEP for improved strength, balance, gait. Baseline: Goal status: INITIAL  2.  Pt will perform 5x sit<>stand test in 5 seconds less than baseline measure, to demo improved functional strength. Baseline: 17 sec w/ BUE push-off Goal status: INITIAL  3.  Gait velocity to improve to 1 ft/sec for improved gait efficiency and safety. Baseline: 0.32 ft/sec Goal status: INITIAL   LONG TERM GOALS: Target date: 04/08/2023  Pt will be independent with HEP for improved strength, balance, gait, decreased hip pain. Baseline:  Goal status: INITIAL  2.  Pt  will improve 5x sit<>stand to less than or equal to 12 sec to demonstrate improved functional strength and transfer efficiency. Baseline:  Goal status: INITIAL  3.  Pt will improve Berg score by at least 10 points from baseline to decrease fall risk. Baseline: 33/56 Goal status: INITIAL  4.  Gait velocity to improve to 1.8 ft/sec for improved gait efficiency and safety Baseline:  Goal status: INITIAL  5.  Pt will negotiate at least 3 steps with one handrail and min guard for improved negotiation into family's homes. Baseline:  Goal status: INITIAL  6.  Pt will ambulate at least 500 ft with least restrictive device for improved community gait. Baseline:  Goal status: INITIAL  ASSESSMENT:  CLINICAL IMPRESSION: Returns to clinic and able to participate  with trial of 5xSTS requiring BUE push-off. Berg Balance Test required modification and occasional use of RW due to postural/structural deviations for max safety and scores 33/56 indicating high risk for falls. Initiated HEP with bias to seated open chain PRE to improve strength and activity tolerance. Recommended purchase of adjustable ankle weights to complement future HEP development.   OBJECTIVE IMPAIRMENTS: Abnormal gait, decreased balance, decreased mobility, difficulty walking, decreased strength, impaired flexibility, postural dysfunction, and pain.   ACTIVITY LIMITATIONS: bending, standing, stairs, transfers, bathing, toileting, dressing, hygiene/grooming, and locomotion level  PARTICIPATION LIMITATIONS: driving, shopping, and community activity  PERSONAL FACTORS: 3+ comorbidities: See PMH  are also affecting patient's functional outcome.   REHAB POTENTIAL: Good  CLINICAL DECISION MAKING: Evolving/moderate complexity  EVALUATION COMPLEXITY: Moderate  PLAN:  PT FREQUENCY: 2x/week  PT DURATION: 8 weeks plus eval week  PLANNED INTERVENTIONS: Therapeutic exercises, Therapeutic activity, Neuromuscular re-education, Balance  training, Gait training, Patient/Family education, Self Care, Stair training, Orthotic/Fit training, and DME instructions  PLAN FOR NEXT SESSION:HEP review and additions to. Likely need to bias for open chain PRE   Dion Body, PT 02/16/2023, 4:18 PM  Lonia Blood, PT 02/16/23 4:18 PM Phone: [phone] Fax: [phone]

## 2023-02-21 ENCOUNTER — Encounter: Payer: Self-pay | Admitting: Physical Therapy

## 2023-02-21 ENCOUNTER — Encounter (HOSPITAL_BASED_OUTPATIENT_CLINIC_OR_DEPARTMENT_OTHER): Payer: Medicare Other | Attending: Internal Medicine | Admitting: Internal Medicine

## 2023-02-21 ENCOUNTER — Ambulatory Visit: Payer: Medicare Other | Attending: Physical Medicine & Rehabilitation | Admitting: Physical Therapy

## 2023-02-21 DIAGNOSIS — Z8781 Personal history of (healed) traumatic fracture: Secondary | ICD-10-CM | POA: Diagnosis not present

## 2023-02-21 DIAGNOSIS — E11622 Type 2 diabetes mellitus with other skin ulcer: Secondary | ICD-10-CM | POA: Diagnosis not present

## 2023-02-21 DIAGNOSIS — R2689 Other abnormalities of gait and mobility: Secondary | ICD-10-CM | POA: Insufficient documentation

## 2023-02-21 DIAGNOSIS — R2681 Unsteadiness on feet: Secondary | ICD-10-CM | POA: Diagnosis present

## 2023-02-21 DIAGNOSIS — M6281 Muscle weakness (generalized): Secondary | ICD-10-CM | POA: Insufficient documentation

## 2023-02-21 DIAGNOSIS — Z09 Encounter for follow-up examination after completed treatment for conditions other than malignant neoplasm: Secondary | ICD-10-CM | POA: Diagnosis not present

## 2023-02-21 DIAGNOSIS — S32009A Unspecified fracture of unspecified lumbar vertebra, initial encounter for closed fracture: Secondary | ICD-10-CM | POA: Diagnosis not present

## 2023-02-21 DIAGNOSIS — Y838 Other surgical procedures as the cause of abnormal reaction of the patient, or of later complication, without mention of misadventure at the time of the procedure: Secondary | ICD-10-CM | POA: Insufficient documentation

## 2023-02-21 DIAGNOSIS — L98422 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: Secondary | ICD-10-CM | POA: Diagnosis not present

## 2023-02-21 DIAGNOSIS — T8131XA Disruption of external operation (surgical) wound, not elsewhere classified, initial encounter: Secondary | ICD-10-CM | POA: Diagnosis present

## 2023-02-21 NOTE — Therapy (Signed)
OUTPATIENT PHYSICAL THERAPY NEURO TREATMENT   Patient Name: [name] MRN: [id number] DOB:[birth year], 67 y.o., female Today's Date: 02/21/2023   PCP: Aliene Beams, MD REFERRING PROVIDER: Fanny Dance, MD  END OF SESSION:  PT End of Session - 02/21/23 1322     Visit Number 3    Number of Visits 17    Date for PT Re-Evaluation 04/08/23    Authorization Type BCBS Medicare    PT Start Time 1319    PT Stop Time 1400    PT Time Calculation (min) 41 min    Equipment Utilized During Treatment Gait belt   gait belt place low due to thoracic spine wound   Activity Tolerance Patient tolerated treatment well    Behavior During Therapy WFL for tasks assessed/performed              Past Medical History:  Diagnosis Date   Anemia    Anxiety    Arthritis    Bipolar disorder (HCC)    Depression    GERD (gastroesophageal reflux disease)    Heart murmur    "related to VSD"   High cholesterol    History of blood transfusion    "related to OR" (08/19/2016)   History of hiatal hernia    Hypertension    Hyperthyroidism    Mild cognitive impairment 09/06/2018   Paroxysmal ventricular tachycardia (HCC)    Type II diabetes mellitus (HCC)    UTI (urinary tract infection)    being treated with Keflex   Ventricular septal defect    Ventriculitis of brain due to bacteria 11/02/2022   Vertebral fracture, osteoporotic (HCC) 01/06/2023   Past Surgical History:  Procedure Laterality Date   ABDOMINAL HYSTERECTOMY     BACK SURGERY     CARDIAC CATHETERIZATION N/A 06/23/2015   Procedure: Left Heart Cath and Coronary Angiography;  Surgeon: Laurey Morale, MD;  Location: [facility] INVASIVE CV LAB;  Service: Cardiovascular;  Laterality: N/A;   CARDIAC CATHETERIZATION  1960   "VSD was so small; didn't need repaired"   EXAM UNDER ANESTHESIA WITH MANIPULATION OF HIP Right 06/02/2014   dr Jerl Santos   FRACTURE SURGERY     HERNIA REPAIR     HIP CLOSED REDUCTION Right 06/02/2014    Procedure: CLOSED MANIPULATION HIP;  Surgeon: Velna Ochs, MD;  Location: [facility] OR;  Service: Orthopedics;  Laterality: Right;   JOINT REPLACEMENT     JOINT REPLACEMENT     POSTERIOR LUMBAR FUSION 4 LEVEL N/A 06/26/2022   Procedure: Lumbar One To Lumbar Five Posterior Instrumented Fusion;  Surgeon: Jadene Pierini, MD;  Location: [facility] OR;  Service: Neurosurgery;  Laterality: N/A;   REFRACTIVE SURGERY Bilateral    SHOULDER ARTHROSCOPY Right    SHOULDER OPEN ROTATOR CUFF REPAIR Right    SPINAL FUSION  1996   "t10 down to my coccyx   SPINE HARDWARE REMOVAL     TOTAL ABDOMINAL HYSTERECTOMY     TOTAL HIP ARTHROPLASTY Right 05/10/2014   [email or web address]      by dr Cristal Deer olcott   TOTAL KNEE ARTHROPLASTY Left    TOTAL SHOULDER ARTHROPLASTY Left 08/19/2016   Procedure: TOTAL SHOULDER ARTHROPLASTY;  Surgeon: Jones Broom, MD;  Location: [facility] OR;  Service: Orthopedics;  Laterality: Left;  Left total shoulder replacement   Patient Active Problem List   Diagnosis Date Noted   Nonobstructive atherosclerosis of coronary artery 02/10/2023   Vertebral fracture, osteoporotic (HCC) 01/06/2023   Ventriculitis of brain due to bacteria 11/02/2022   Anemia  of chronic illness 10/12/2022   Bipolar disorder, in partial remission, most recent episode depressed (HCC) 10/07/2022   Adjustment disorder with mixed anxiety and depressed mood 09/30/2022   Chronic wound 09/25/2022   Hyperglycemia 09/25/2022   Chronic midline thoracic back pain 09/17/2022   Primary insomnia 09/17/2022   Chronic combined systolic and diastolic CHF (congestive heart failure) (HCC) 09/14/2022   Acute blood loss anemia 09/09/2022   Infection of lumbar spine (HCC) 09/03/2022   Malnutrition of moderate degree 08/20/2022   Hardware complicating wound infection (HCC) 08/20/2022   Infection of deep incisional surgical site after procedure 08/20/2022   Aspiration pneumonia of both lungs (HCC) 08/19/2022   Acute respiratory failure with  hypoxia (HCC) 08/19/2022   Sepsis (HCC) 08/19/2022   Seizure (HCC) 08/19/2022   Wound dehiscence 08/09/2022   Delayed surgical wound healing 07/08/2022   Fracture of lumbar spine without cord injury (HCC) 06/26/2022   Lumbar vertebral fracture (HCC) 06/25/2022   Mood disorder (HCC) 09/29/2021   Mild cognitive impairment 09/06/2018   Hyponatremia 02/28/2018   S/P shoulder replacement, left 08/19/2016   Anxiety 10/17/2014   Acid reflux 10/17/2014   BP (high blood pressure) 10/17/2014   Arthritis, degenerative 10/17/2014   Adult hypothyroidism 10/17/2014   UTI (urinary tract infection) 06/03/2014   Fracture of bone adjacent to prosthesis 06/03/2014   Diabetes (HCC) 06/02/2014   Peri-prosthetic fracture of femur following total hip arthroplasty 06/02/2014   Urinary retention 05/13/2014   Chronic pain 05/10/2014   History of hip surgery 05/10/2014   UNSPECIFIED HEART FAILURE 06/24/2010   UNSPECIFIED CONGENITAL DEFECT OF SEPTAL CLOSURE 06/24/2010   TOBACCO ABUSE 03/18/2010   Secondary cardiomyopathy (HCC) 03/18/2010   DM 06/13/2009   HYPERTENSION, UNSPECIFIED 06/13/2009   VENTRICULAR TACHYCARDIA 06/13/2009   VENTRICULAR SEPTAL DEFECT, CONGENITAL 06/13/2009   Diabetes mellitus, type 2 (HCC) 06/13/2009   Essential (primary) hypertension 06/13/2009    ONSET DATE: 01/30/2023 (MD referral)  REFERRING DIAG: M46.26 (ICD-10-CM) - Infection of lumbar spine (HCC)   THERAPY DIAG:  Muscle weakness (generalized)  Unsteadiness on feet  Other abnormalities of gait and mobility  Rationale for Evaluation and Treatment: Rehabilitation  SUBJECTIVE:                                                                                                                                                                                             SUBJECTIVE STATEMENT: Things going well.  Been using the w/c and pivoting into the car.  Feel like the right leg is waking up. Pt accompanied by: family member  and daughter  PERTINENT HISTORY: Hx of chronic wound (thoracic spine area) having  had a postoperative surgery complicated by wound infection with CSF leak and ventriculitis with concern for hardware potentially being involved; osteomyelitis; spinal compression fracture (T10)   67 y.o. female with hx of scoliosis surgery with extensive lower thoracic and lumbar fusion roughly 43yrs ago, she suffered ground level fall in late November, where she fractures through all three columns of L3, thus went to OR on 12/9 for stabilization, new  posterolateral instrumentation fusion on June 26, 2022 complicated by Wound dehiscence with exposed hardware and unstable lumbar spine fracture status post revision of lumbar wound with removal of L1-3 4 and 5 lumbar screws and rod and removal of right L1 screw and revision of L1-L5 posterior instrumented fusion on August 09, 2022 and now concern for postoperative wound infection and ventriculitis. She had complicated protracted course in the icu, but eventually was discharged to CIR on vancomycin and cefepime through 3/14 for ventriculitis and hw complicating wound infection.    Mri of brain on 09/29/22- resolution of ventriculitis.  PAIN:  Are you having pain? Yes: NPRS scale: 0/10 Pain location: R hip Pain description: achy Aggravating factors: standing  Relieving factors: sitting  PRECAUTIONS: Back, Fall, and Other: hx of osteoporosis and fractures ; avoid twisting; avoid excess bending; is to log roll for bed mobility; (thoracic) wound being followed by wound care.  RED FLAGS: None   WEIGHT BEARING RESTRICTIONS: No  FALLS: Has patient fallen in last 6 months? Yes. Number of falls 1  LIVING ENVIRONMENT: Lives with: lives with their spouse; has caregiver at home; ramp to enter; one level home Lives in: House/apartment Stairs: Yes: Internal: 1 steps; none; family's homes have steps to get in Has following equipment at home: Dan Humphreys - 2 wheeled, Wheelchair  (manual), Shower bench, and bed side commode *has used shoe with lift in the distant past for Leg length discrepancy PLOF: Independent using cane  PATIENT GOALS: Wants to work on balance, walking, stairs, to get in and out of a car betterto be independent again  OBJECTIVE:    TODAY'S TREATMENT: 02/21/2023 Activity Comments  LAQ 2 x 10,3# Cues for eccentric control  Seated march, 2 x 10 Assist for lifting and resistance into eccentric phase, no resistance  Seated resisted hip abduction 2 x 10 reps Red theraband  Seated hamstring curl, 2 x 10 reps Red theraband, cues for eccentric control  Sit<>stand 2 x 5 reps from elevated mat surface, use of hands from mat Good form for slowly return to sit  Gait x 3 reps in session, 40 ft with RW, min guard assist       PATIENT EDUCATION: Education details: Review of exercises in HEP and discussed importance of consistency of HEP performance.  Briefly discussed leg length discrepancy and pt's history of using shoe with external lift built up (she felt that it aggravated hip and back pain) Person educated: Patient Education method: Explanation and Demonstration Education comprehension: verbalized understanding and returned demonstration   -------------------------------------------------------------------------------------------------- Objective measures below taken at initial evaluation:  DIAGNOSTIC FINDINGS: CT lumbar spine 2022-12-09 IMPRESSION: 1. Pronounced chronic osteopenia. Prior decompression and fusion throughout the lumbar spine.   2. L3 superior endplate/superior body non-acute fracture AND subtle associated horizontal fracture through the chronic posterior element bone mass there (series 9, image 53 today). But no displacement over this series of exams. 3. Difficult to exclude nondisplaced S3/S4 sacral fracture, with mild presacral stranding there.   4. But no other acute osseous abnormality identified. And elsewhere the chronic  T12 through sacral  ankylosis/arthrodesis appears intact.   5. T10 compression fracture is detailed separately today on CT Thoracic Spine.  COGNITION: Overall cognitive status: Within functional limits for tasks assessed   SENSATION: Light touch: WFL Reports numbness bottom of L foot, sometimes into lateral calf    POSTURE: rounded shoulders, weight shift right, and lateral lean towards R, L shoulder lower than R  LOWER EXTREMITY ROM:   WFL except decreased full Active R hip flexion  LOWER EXTREMITY MMT:    MMT Right Eval Left Eval  Hip flexion 3- 3  Hip extension    Hip abduction    Hip adduction    Hip internal rotation    Hip external rotation    Knee flexion 3+ 3+  Knee extension 3+ 4  Ankle dorsiflexion 3+ 3+  Ankle plantarflexion    Ankle inversion    Ankle eversion    (Blank rows = not tested)   TRANSFERS: Assistive device utilized: Environmental consultant - 2 wheeled  Sit to stand: CGA Stand to sit: CGA  GAIT: Gait pattern: step to pattern, step through pattern, decreased step length- Right, decreased step length- Left, Left hip hike, and knee flexed in stance- Left Distance walked: 35 ft Assistive device utilized: Walker - 2 wheeled Level of assistance: CGA Comments: stops x 1 due to fatigue  FUNCTIONAL TESTS:  10 meter walk test: attempted; unable to complete:  she ambulates 30 ft in 93.6 sec (0.32 ft/sec) Pt stands near walker, no UE support with supervision x 20-30 seconds  TODAY'S TREATMENT:                                                                                                                              DATE: 02/09/2023    PATIENT EDUCATION: Education details: Eval results, POC Person educated: Patient and Child(ren) Education method: Explanation Education comprehension: verbalized understanding  HOME EXERCISE PROGRAM: Access Code: [email or web address] URL: [email or web address] Date: 02/16/2023 Prepared by: Shary Decamp  Exercises -  Seated Long Arc Quad with Ankle Weight  - 1 x daily - 7 x weekly - 3 sets - 10 reps - Seated Hamstring Curl with Anchored Resistance  - 1 x daily - 7 x weekly - 3 sets - 10 reps - Seated Hip Abduction with Resistance  - 1 x daily - 7 x weekly - 3 sets - 10 reps  GOALS: Goals reviewed with patient? Yes  SHORT TERM GOALS: Target date: 03/11/2023  Pt will be independent with HEP for improved strength, balance, gait. Baseline: Goal status: IN PROGRESS  2.  Pt will perform 5x sit<>stand test in 5 seconds less than baseline measure, to demo improved functional strength. Baseline: 17 sec w/ BUE push-off Goal status: IN PROGRESS   3.  Gait velocity to improve to 1 ft/sec for improved gait efficiency and safety. Baseline: 0.32 ft/sec Goal status: IN PROGRESS    LONG TERM GOALS: Target date: 04/08/2023  Pt will be independent with HEP for improved strength,  balance, gait, decreased hip pain. Baseline:  Goal status: IN PROGRESS  2.  Pt will improve 5x sit<>stand to less than or equal to 12 sec to demonstrate improved functional strength and transfer efficiency. Baseline:  Goal status: IN PROGRESS  3.  Pt will improve Berg score by at least 10 points from baseline to decrease fall risk. Baseline: 33/56 Goal status: IN PROGRESS  4.  Gait velocity to improve to 1.8 ft/sec for improved gait efficiency and safety Baseline:  Goal status: IN PROGRESS  5.  Pt will negotiate at least 3 steps with one handrail and min guard for improved negotiation into family's homes. Baseline:  Goal status: IN PROGRESS  6.  Pt will ambulate at least 500 ft with least restrictive device for improved community gait. Baseline:  Goal status:IN PROGRESS  ASSESSMENT:  CLINICAL IMPRESSION: Reviewed HEP today and pt return demo understanding.  She reports not yet having done HEP, but she has gotten the weights.  Gait with RW x 3 reps today, with pt fatiguing at about 40 ft of gait each time.  Brief discussion  of hx of external lift on shoe for RLE and pt reports it seemed it contributed to back and hip pain.  Will continue to progress strengthening and standing/gait activities towards goals.    OBJECTIVE IMPAIRMENTS: Abnormal gait, decreased balance, decreased mobility, difficulty walking, decreased strength, impaired flexibility, postural dysfunction, and pain.   ACTIVITY LIMITATIONS: bending, standing, stairs, transfers, bathing, toileting, dressing, hygiene/grooming, and locomotion level  PARTICIPATION LIMITATIONS: driving, shopping, and community activity  PERSONAL FACTORS: 3+ comorbidities: See PMH  are also affecting patient's functional outcome.   REHAB POTENTIAL: Good  CLINICAL DECISION MAKING: Evolving/moderate complexity  EVALUATION COMPLEXITY: Moderate  PLAN:  PT FREQUENCY: 2x/week  PT DURATION: 8 weeks plus eval week  PLANNED INTERVENTIONS: Therapeutic exercises, Therapeutic activity, Neuromuscular re-education, Balance training, Gait training, Patient/Family education, Self Care, Stair training, Orthotic/Fit training, and DME instructions  PLAN FOR NEXT SESSION: Progress HEP. Likely need to bias for open chain PRE    Lonia Blood, PT 02/21/23 4:54 PM Phone: [phone] Fax: [phone]  [facility] at [facility] [address] [place], [address] [place], [place] [address] Phone # [phone] Fax # [phone]

## 2023-02-21 NOTE — Progress Notes (Signed)
Marissa, [name] ([id number]) [id number] Page 1 of 8 Visit Report for 02/21/2023 Chief Complaint Document Details Patient Name: Date of Service: TRO [name] 02/21/2023 3:00 PM Medical Record Number: [id number] Patient Account Number: [id number] Date of Birth/Sex: Treating RN: [birth year] (67 y.o. F) Primary Care Provider: Aliene Beams Other Clinician: Referring Provider: Treating Provider/Extender: Marguerite Olea in Treatment: 6 Information Obtained from: Patient Chief Complaint 01/07/2023; back wound Electronic Signature(s) Signed: 02/21/2023 4:29:36 PM By: Geralyn Corwin DO Entered By: Geralyn Corwin on 02/21/2023 15:51:05 -------------------------------------------------------------------------------- HPI Details Patient Name: Date of Service: [name]. 02/21/2023 3:00 PM Medical Record Number: [id number] Patient Account Number: [id number] Date of Birth/Sex: Treating RN: 09-29-1955 (67 y.o. F) Primary Care Provider: Aliene Beams Other Clinician: Referring Provider: Treating Provider/Extender: Marguerite Olea in Treatment: 6 History of Present Illness HPI Description: 01/07/2023 [name] is a 67 year old female with a past medical history of controlled type 2 diabetes on oral agents, lumbar fractures and bipolar disorder that presents to the clinic for a 13-month history of nonhealing ulcer to the back. On 06/26/2022 she had L1-L5 fracture stabilization and posterior lateral instrumented fusion secondary to closed unstable fracture of the lumbar spine. This was complicated by wound dehiscence and exposed hardware and she subsequently required revision of the L1-L5 with removal of screws and rods. Post surgery she had complications with CSF leak and developed bacterial meningitis/ventriculitis. She required hospitalization and IV antibiotics and is currently following with  infectious disease and is on cefdinir and doxycycline. She recently had a CT thoracic spine and CT lumbar spine on 12/01/2022 with no evidence of acute osseous abnormalities. No evidence of osteomyelitis. She currently denies signs of systemic infection. She has been using Medihoney and Aquacel Ag to the wound bed. Unfortunately she cannot lay on her sides and lays directly on her back when laying down. 7/8; patient presents for follow-up. She has been using Aquacel Ag with Medihoney to the wound bed. She has no issues or complaints today. Wound is smaller. 8/5; patient presents for follow-up. She has been using Aquacel Ag with Medihoney to the wound bed. She has no issues or complaints today. Wound is much smaller. Electronic Signature(s) Signed: 02/21/2023 4:29:36 PM By: Geralyn Corwin DO Entered By: Geralyn Corwin on 02/21/2023 15:51:27 [name] ([id number]) [id number] Page 2 of 8 -------------------------------------------------------------------------------- Physical Exam Details Patient Name: Date of Service: TRO [name] 02/21/2023 3:00 PM Medical Record Number: [id number] Patient Account Number: [id number] Date of Birth/Sex: Treating RN: 01-15-1956 (67 y.o. F) Primary Care Provider: Aliene Beams Other Clinician: Referring Provider: Treating Provider/Extender: Marguerite Olea in Treatment: 6 Constitutional respirations regular, non-labored and within target range for patient.[name] Psychiatric pleasant and cooperative. Notes T the thoracic region on the posterior back there is an open wound with granulation tissue at the opening. Minimal depth. No signs of active infection. o Electronic Signature(s) Signed: 02/21/2023 4:29:36 PM By: Geralyn Corwin DO Entered By: Geralyn Corwin on 02/21/2023 15:52:09 -------------------------------------------------------------------------------- Physician Orders  Details Patient Name: Date of Service: [name] 02/21/2023 3:00 PM Medical Record Number: [id number] Patient Account Number: [id number] Date of Birth/Sex: Treating RN: 08-23-1955 (67 y.o. [name] Primary Care Provider: Aliene Beams Other Clinician: Referring Provider: Treating Provider/Extender: Marguerite Olea in Treatment: 6 Verbal / Phone Orders: No Diagnosis Coding Follow-up Appointments Return appointment in 3 weeks. - Dr Mikey Bussing - Room 8 2:15pm 03/14/23 Anesthetic Wound #1  Back (In clinic) Topical Lidocaine 4% applied to wound bed Bathing/ Shower/ Hygiene May shower and wash wound with soap and water. - Shower prior to dressing change, otherwise keep dressing dry Off-Loading Wound #1 Back Turn and reposition every 2 hours - off load wound area with pillows Wound Treatment Wound #1 - Back Cleanser: Vashe 5.8 (oz) Every Other Day/15 Days Discharge Instructions: Cleanse the wound with Vashe prior to applying a clean dressing using gauze sponges, not tissue or cotton balls. Prim Dressing: MediHoney Gel, tube 1.5 (oz) Every Other Day/15 Days ary Discharge Instructions: Apply to wound bed as instructed Prim Dressing: Aquacel Ag Dressing, 2x2 (in/in) ary Every Other Day/15 Days Secondary Dressing: Zetuvit Plus Silicone Border Dressing 4x4 (in/in) Every Other Day/15 Days [name] ([id number]) [phone].pdf Page 3 of 8 Discharge Instructions: Apply silicone border over primary dressing as directed. Patient Medications llergies: No Known Drug Allergies A Notifications Medication Indication Start End 02/21/2023 lidocaine DOSE topical 4 % cream - cream topical once daily Electronic Signature(s) Signed: 02/21/2023 4:29:36 PM By: Geralyn Corwin DO Entered By: Geralyn Corwin on 02/21/2023 15:52:16 -------------------------------------------------------------------------------- Problem List  Details Patient Name: Date of Service: [name]. 02/21/2023 3:00 PM Medical Record Number: [id number] Patient Account Number: [id number] Date of Birth/Sex: Treating RN: 09/03/1955 (67 y.o. 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 01/07/2023 No Yes E11.622 Type 2 diabetes mellitus with other skin ulcer 01/07/2023 No Yes T81.31XA Disruption of external operation (surgical) wound, not elsewhere classified, 01/07/2023 No Yes initial encounter S32.009A Unspecified fracture of unspecified lumbar vertebra, initial encounter for 01/07/2023 No Yes closed fracture Inactive Problems Resolved Problems Electronic Signature(s) Signed: 02/21/2023 4:29:36 PM By: Geralyn Corwin DO Entered By: Geralyn Corwin on 02/21/2023 15:50:46 Veazey, [name] ([id number]) [id number] Page 4 of 8 -------------------------------------------------------------------------------- Progress Note Details Patient Name: Date of Service: TRO [name] 02/21/2023 3:00 PM Medical Record Number: [id number] Patient Account Number: [id number] Date of Birth/Sex: Treating RN: [birth year] (67 y.o. F) Primary Care Provider: Aliene Beams Other Clinician: Referring Provider: Treating Provider/Extender: Marguerite Olea in Treatment: 6 Subjective Chief Complaint Information obtained from Patient 01/07/2023; back wound History of Present Illness (HPI) 01/07/2023 [name] is a 67 year old female with a past medical history of controlled type 2 diabetes on oral agents, lumbar fractures and bipolar disorder that presents to the clinic for a 25-month history of nonhealing ulcer to the back. On  06/26/2022 she had L1-L5 fracture stabilization and posterior lateral instrumented fusion secondary to closed unstable fracture of the lumbar spine. This was complicated by wound dehiscence and exposed hardware and she subsequently required revision of the L1-L5 with removal of screws and rods. Post surgery she had complications with CSF leak and developed bacterial meningitis/ventriculitis. She required hospitalization and IV antibiotics and is currently following with infectious disease and is on cefdinir and doxycycline. She recently had a CT thoracic spine and CT lumbar spine on 12/01/2022 with no evidence of acute osseous abnormalities. No evidence of osteomyelitis. She currently denies signs of systemic infection. She has been using Medihoney and Aquacel Ag to the wound bed. Unfortunately she cannot lay on her sides and lays directly on her back when laying down. 7/8; patient presents for follow-up. She has been using Aquacel Ag with Medihoney to the wound bed.  She has no issues or complaints today. Wound is smaller. 8/5; patient presents for follow-up. She has been using Aquacel Ag with Medihoney to the wound bed. She has no issues or complaints today. Wound is much smaller. Patient History Family History Seizures - Mother, Stroke - Father, No family history of Cancer, Diabetes, Heart Disease, Hereditary Spherocytosis, Hypertension, Kidney Disease, Lung Disease, Thyroid Problems, Tuberculosis. Social History Former smoker - quit 07/2022, Marital Status - Married, Alcohol Use - Never, Drug Use - No History, Caffeine Use - Never. Medical History Eyes Patient has history of Cataracts Denies history of Glaucoma, Optic Neuritis Ear/Nose/Mouth/Throat Denies history of Chronic sinus problems/congestion, Middle ear problems Hematologic/Lymphatic Patient has history of Anemia Denies history of Hemophilia, Human Immunodeficiency Virus, Lymphedema, Sickle Cell Disease Cardiovascular Patient has  history of Hypertension Denies history of Angina, Arrhythmia, Congestive Heart Failure, Coronary Artery Disease, Deep Vein Thrombosis, Hypotension, Myocardial Infarction, Peripheral Arterial Disease, Peripheral Venous Disease, Phlebitis, Vasculitis Gastrointestinal Denies history of Cirrhosis , Colitis, Crohns, Hepatitis A, Hepatitis B, Hepatitis C Endocrine Patient has history of Type II Diabetes Genitourinary Denies history of End Stage Renal Disease Immunological Denies history of Lupus Erythematosus, Raynauds, Scleroderma Integumentary (Skin) Denies history of History of Burn Musculoskeletal Patient has history of Osteoarthritis Denies history of Gout, Rheumatoid Arthritis, Osteomyelitis Neurologic Denies history of Dementia, Neuropathy, Quadriplegia, Paraplegia, Seizure Disorder Psychiatric Patient has history of Confinement Anxiety Denies history of Anorexia/bulimia Hospitalization/Surgery History - 01/06/2023 osteoporotric vertebral fx. - 06/26/2022 Posterior lumbar fusion 4 level (N/A). - 1996 spinal fusion. - 2015 both hip replacement. - 2018 left shoulder sx. - hysterectomy. Medical A Surgical History Notes nd Constitutional Symptoms (General Health) surgery 06/26/2022 due to a fall and fx fusion- complications hardware exposed, underwent surgery 08/09/2022 revision- surgical site unable to close. Hyperthyroidism Cardiovascular Ventricular septal defect Paroxysmal ventricular tachycardia Gastrointestinal GERD Psychiatric bipolar anxiety depression [name], [name] ([id number]) [phone].pdf Page 5 of 8 Objective Constitutional respirations regular, non-labored and within target range for patient.. Vitals Time Taken: 3:30 PM, Height: 61 in, Weight: 139 lbs, BMI: 26.3, Temperature: 98.1 F, Pulse: 85 bpm, Respiratory Rate: 18 breaths/min, Blood Pressure: 104/68 mmHg. Psychiatric pleasant and cooperative. General Notes: T the thoracic region  on the posterior back there is an open wound with granulation tissue at the opening. Minimal depth. No signs of active o infection. Integumentary (Hair, Skin) Wound #1 status is Open. Original cause of wound was Surgical Injury. The date acquired was: 08/11/2022. The wound has been in treatment 6 weeks. The wound is located on the Back. The wound measures 0.2cm length x 0.2cm width x 0.2cm depth; 0.031cm^2 area and 0.006cm^3 volume. There is Fat Layer (Subcutaneous Tissue) exposed. There is no tunneling or undermining noted. There is a medium amount of serosanguineous drainage noted. There is large (67- 100%) red granulation within the wound bed. There is a small (1-33%) amount of necrotic tissue within the wound bed including Adherent Slough. The periwound skin appearance exhibited: Scarring, Hemosiderin Staining. Periwound temperature was noted as No Abnormality. 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 I recommended continuing with Medihoney and Aquacel Ag. Continue with aggressive offloading. Follow-up in 3 weeks and I am hopeful the wound will be healed by then. Plan  Follow-up Appointments: Return appointment in 3 weeks. - Dr Mikey Bussing - Room 8 2:15pm 03/14/23 Anesthetic: Wound #1 Back: (In clinic) Topical Lidocaine 4% applied to wound bed Bathing/ Shower/ Hygiene: May shower and wash wound with soap and water. - Shower prior to dressing change, otherwise keep dressing dry Off-Loading: Wound #1 Back: Turn and reposition every 2 hours - off load wound area with pillows The following medication(s) was prescribed: lidocaine  topical 4 % cream cream topical once daily was prescribed at facility WOUND #1: - Back Wound Laterality: Cleanser: Vashe 5.8 (oz) Every Other Day/15 Days Discharge Instructions: Cleanse the wound with Vashe prior to applying a clean dressing using gauze sponges, not tissue or cotton balls. Prim Dressing: MediHoney Gel, tube 1.5 (oz) Every Other Day/15 Days ary Discharge Instructions: Apply to wound bed as instructed Prim Dressing: Aquacel Ag Dressing, 2x2 (in/in) Every Other Day/15 Days ary Secondary Dressing: Zetuvit Plus Silicone Border Dressing 4x4 (in/in) Every Other Day/15 Days Discharge Instructions: Apply silicone border over primary dressing as directed. 1. Aquacel Ag and Medihoney 2. Aggressive offloading 3. Follow-up in 3 weeks Electronic Signature(s) [name], [name] ([id number]) [id number] Page 6 of 8 Signed: 02/21/2023 4:29:36 PM By: Geralyn Corwin DO Entered By: Geralyn Corwin on 02/21/2023 15:53:06 -------------------------------------------------------------------------------- HxROS Details Patient Name: Date of Service: [name]. 02/21/2023 3:00 PM Medical Record Number: [id number] Patient Account Number: [id number] Date of Birth/Sex: Treating RN: [birth year] (67 y.o. F) Primary Care Provider: Aliene Beams Other Clinician: Referring Provider: Treating Provider/Extender: Marguerite Olea in Treatment: 6 Constitutional Symptoms (General Health) Medical History: Past Medical History Notes: surgery 06/26/2022 due to a fall and fx fusion- complications hardware exposed, underwent surgery 08/09/2022 revision- surgical site unable to close. Hyperthyroidism Eyes Medical History: Positive for: Cataracts Negative for: Glaucoma; Optic Neuritis Ear/Nose/Mouth/Throat Medical History: Negative for: Chronic sinus problems/congestion; Middle ear problems Hematologic/Lymphatic Medical History: Positive for:  Anemia Negative for: Hemophilia; Human Immunodeficiency Virus; Lymphedema; Sickle Cell Disease Cardiovascular Medical History: Positive for: Hypertension Negative for: Angina; Arrhythmia; Congestive Heart Failure; Coronary Artery Disease; Deep Vein Thrombosis; Hypotension; Myocardial Infarction; Peripheral Arterial Disease; Peripheral Venous Disease; Phlebitis; Vasculitis Past Medical History Notes: Ventricular septal defect Paroxysmal ventricular tachycardia Gastrointestinal Medical History: Negative for: Cirrhosis ; Colitis; Crohns; Hepatitis A; Hepatitis B; Hepatitis C Past Medical History Notes: GERD Endocrine Medical History: Positive for: Type II Diabetes Time with diabetes: 10 years Treated with: Oral agents Genitourinary Medical History: Negative for: End Stage Renal Disease Immunological Medical History: Negative for: Lupus Erythematosus; [address] Scleroderma [name], [name] ([id number]) [id number] Page 7 of 8 Integumentary (Skin) Medical History: Negative for: History of Burn Musculoskeletal Medical History: Positive for: Osteoarthritis Negative for: Gout; Rheumatoid Arthritis; Osteomyelitis Neurologic Medical History: Negative for: Dementia; Neuropathy; Quadriplegia; Paraplegia; Seizure Disorder Psychiatric Medical History: Positive for: Confinement Anxiety Negative for: Anorexia/bulimia Past Medical History Notes: bipolar anxiety depression HBO Extended History Items Eyes: Cataracts Immunizations Pneumococcal Vaccine: Received Pneumococcal Vaccination: Yes Received Pneumococcal Vaccination On or After 60th Birthday: Yes Implantable Devices Yes Hospitalization / Surgery History Type of Hospitalization/Surgery 01/06/2023 osteoporotric vertebral fx 06/26/2022 Posterior lumbar fusion 4 level (N/A) 1996 spinal fusion 2015 both hip replacement 2018 left shoulder sx hysterectomy Family and Social History Cancer: No;  Diabetes: No; Heart Disease: No; Hereditary Spherocytosis: No; Hypertension: No; Kidney Disease: No; Lung Disease: No; Seizures: Yes - Mother; Stroke: Yes - Father; Thyroid Problems: No; Tuberculosis: No; Former smoker - quit 07/2022; Marital Status - Married; Alcohol Use: Never; Drug Use: No History; Caffeine Use:  Never; Financial Concerns: No; Food, Clothing or Shelter Needs: No; Support System Lacking: No; Transportation Concerns: No Electronic Signature(s) Signed: 02/21/2023 4:29:36 PM By: Geralyn Corwin DO Entered By: Geralyn Corwin on 02/21/2023 15:51:33 -------------------------------------------------------------------------------- SuperBill Details Patient Name: Date of Service: [name] 02/21/2023 Medical Record Number: [id number] Patient Account Number: [id number] Date of Birth/Sex: Treating RN: 1955/11/15 (67 y.o. 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 E11.622 Type 2 diabetes mellitus with other skin ulcer T81.31XA Disruption of external operation (surgical) wound, not elsewhere classified, initial encounter S32.009A Unspecified fracture of unspecified lumbar vertebra, initial encounter for closed fracture Facility Procedures : CPT4 Code: [id number] Description: 99213 - WOUND CARE VISIT-LEV 3 EST PT Modifier: Quantity: 1 Physician Procedures : CPT4 Code Description Modifier [id number] - WC PHYS LEVEL 3 - EST PT ICD-10 Diagnosis Description L98.422 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

## 2023-02-21 NOTE — Progress Notes (Signed)
BAHJA, DORING ([id number]) [id number] Page 1 of 8 Visit Report for 02/21/2023 Arrival Information Details Patient Name: Date of Service: TRO [name] 02/21/2023 3:00 PM Medical Record Number: [id number] Patient Account Number: [id number] Date of Birth/Sex: Treating RN: [name] (67 y.o. Orville Govern Primary Care Ladoris Lythgoe: Aliene Beams Other Clinician: Referring Yazlin Ekblad: Treating Jaclynn Laumann/Extender: Marguerite Olea in Treatment: 6 Visit Information History Since Last Visit Added or deleted any medications: No Patient Arrived: Wheel Chair Any new allergies or adverse reactions: No Arrival Time: 15:30 Had a fall or experienced change in No Accompanied By: friend activities of daily living that may affect Transfer Assistance: None risk of falls: Patient Identification Verified: Yes Signs or symptoms of abuse/neglect since last visito No Secondary Verification Process Completed: Yes Hospitalized since last visit: No Patient Requires Transmission-Based Precautions: No Implantable device outside of the clinic excluding No Patient Has Alerts: No cellular tissue based products placed in the center since last visit: Has Dressing in Place as Prescribed: Yes Pain Present Now: No Electronic Signature(s) Signed: 02/21/2023 4:30:08 PM By: Redmond Pulling RN, BSN Entered By: Redmond Pulling on 02/21/2023 16:07:05 -------------------------------------------------------------------------------- Clinic Level of Care Assessment Details Patient Name: Date of Service: TRO [name] 02/21/2023 3:00 PM Medical Record Number: [id number] Patient Account Number: [id number] Date of Birth/Sex: Treating RN: 09-[name]-[name] (67 y.o. Orville Govern Primary Care Kleber Crean: Aliene Beams Other Clinician: Referring Hollye Pritt: Treating Rae Plotner/Extender: Marguerite Olea in Treatment: 6 Clinic Level of Care  Assessment Items TOOL 4 Quantity Score X- 1 0 Use when only an EandM is performed on FOLLOW-UP visit ASSESSMENTS - Nursing Assessment / Reassessment X- 1 [name] Reassessment of Co-morbidities (includes updates in patient status) X- 1 5 Reassessment of Adherence to Treatment Plan ASSESSMENTS - Wound and Skin A ssessment / Reassessment X - Simple Wound Assessment / Reassessment - one wound 1 5 []  - 0 Complex Wound Assessment / Reassessment - multiple wounds []  - 0 Dermatologic / Skin Assessment (not related to wound area) ASSESSMENTS - Focused Assessment []  - 0 Circumferential Edema Measurements - multi extremities []  - 0 Nutritional Assessment / Counseling / Intervention [name], [name] ([id number]) [id number] Page 2 of 8 []  - 0 Lower Extremity Assessment (monofilament, tuning fork, pulses) []  - 0 Peripheral Arterial Disease Assessment (using hand held doppler) ASSESSMENTS - Ostomy and/or Continence Assessment and Care []  - 0 Incontinence Assessment and Management []  - 0 Ostomy Care Assessment and Management (repouching, etc.) PROCESS - Coordination of Care X - Simple Patient / Family Education for ongoing care 1 15 []  - 0 Complex (extensive) Patient / Family Education for ongoing care X- 1 [name] Staff obtains Chiropractor, Records, T Results / Process Orders est []  - 0 Staff telephones HHA, Nursing Homes / Clarify orders / etc []  - 0 Routine Transfer to another Facility (non-emergent condition) []  - 0 Routine Hospital Admission (non-emergent condition) []  - 0 New Admissions / Manufacturing engineer / Ordering NPWT Apligraf, etc. , []  - 0 Emergency Hospital Admission (emergent condition) []  - 0 Simple Discharge Coordination []  - 0 Complex (extensive) Discharge Coordination PROCESS - Special Needs []  - 0 Pediatric / Minor Patient Management []  - 0 Isolation Patient Management []  - 0 Hearing / Language / Visual special needs []  -  0 Assessment of Community assistance (transportation, D/C planning, etc.) []  - 0 Additional assistance / Altered mentation []  - 0 Support Surface(s) Assessment (bed, cushion, seat, etc.) INTERVENTIONS - Wound Cleansing / Measurement X -  Simple Wound Cleansing - one wound 1 5 []  - 0 Complex Wound Cleansing - multiple wounds X- 1 5 Wound Imaging (photographs - any number of wounds) []  - 0 Wound Tracing (instead of photographs) X- 1 5 Simple Wound Measurement - one wound []  - 0 Complex Wound Measurement - multiple wounds INTERVENTIONS - Wound Dressings X - Small Wound Dressing one or multiple wounds 1 [name] []  - 0 Medium Wound Dressing one or multiple wounds []  - 0 Large Wound Dressing one or multiple wounds X- 1 5 Application of Medications - topical []  - 0 Application of Medications - injection INTERVENTIONS - Miscellaneous []  - 0 External ear exam []  - 0 Specimen Collection (cultures, biopsies, blood, body fluids, etc.) []  - 0 Specimen(s) / Culture(s) sent or taken to Lab for analysis []  - 0 Patient Transfer (multiple staff / Nurse, adult / Similar devices) []  - 0 Simple Staple / Suture removal (25 or less) []  - 0 Complex Staple / Suture removal (26 or more) []  - 0 Hypo / Hyperglycemic Management (close monitor of Blood Glucose) [name], [name] ([id number]) [id number] Page 3 of 8 []  - 0 Ankle / Brachial Index (ABI) - do not check if billed separately X- 1 5 Vital Signs Has the patient been seen at the hospital within the last three years: Yes Total Score: 80 Level Of Care: New/Established - Level 3 Electronic Signature(s) Signed: 02/21/2023 4:30:08 PM By: Redmond Pulling RN, BSN Entered By: Redmond Pulling on 02/21/2023 16:03:53 -------------------------------------------------------------------------------- Encounter Discharge Information Details Patient Name: Date of Service: Marissa Poet. 02/21/2023 3:00 PM Medical Record  Number: [id number] Patient Account Number: [id number] Date of Birth/Sex: Treating RN: [name] (67 y.o. Orville Govern Primary Care Ahlam Piscitelli: Aliene Beams Other Clinician: Referring Tyshell Ramberg: Treating Quention Mcneill/Extender: Marguerite Olea in Treatment: 6 Encounter Discharge Information Items Discharge Condition: Stable Ambulatory Status: Wheelchair Discharge Destination: Home Transportation: Private Auto Accompanied By: friend Schedule Follow-up Appointment: Yes Clinical Summary of Care: Patient Declined Electronic Signature(s) Signed: 02/21/2023 4:30:08 PM By: Redmond Pulling RN, BSN Entered By: Redmond Pulling on 02/21/2023 16:06:52 -------------------------------------------------------------------------------- Lower Extremity Assessment Details Patient Name: Date of Service: TRO [name] 02/21/2023 3:00 PM Medical Record Number: [id number] Patient Account Number: [id number] Date of Birth/Sex: Treating RN: [name][birth year] (67 y.o. Orville Govern Primary Care Khadir Roam: Aliene Beams Other Clinician: Referring Donovyn Guidice: Treating Daylen Lipsky/Extender: Marguerite Olea in Treatment: 6 Electronic Signature(s) Signed: 02/21/2023 4:30:08 PM By: Redmond Pulling RN, BSN Entered By: Redmond Pulling on 02/21/2023 15:31:33 -------------------------------------------------------------------------------- Multi Wound Chart Details Patient Name: Date of Service: Marissa Poet 02/21/2023 3:00 PM Medical Record Number: [id number] Patient Account Number: [id number] [name], [name] ([id number]) [id number] Page 4 of 8 Date of Birth/Sex: Treating RN: [name]-07-[name] (67 y.o. F) Primary Care Diannia Hogenson: Aliene Beams Other Clinician: Referring Joriel Streety: Treating Thadius Smisek/Extender: Marguerite Olea in Treatment: 6 Vital Signs Height(in): 61 Pulse(bpm): 85 Weight(lbs): 139 Blood Pressure(mmHg):  104/68 Body Mass Index(BMI): 26.3 Temperature(F): 98.1 Respiratory Rate(breaths/min): 18 [1:Photos:] [N/A:N/A] Back N/A N/A Wound Location: Surgical Injury N/A N/A Wounding Event: Open Surgical Wound N/A N/A Primary Etiology: Cataracts, Anemia, Hypertension, N/A N/A Comorbid History: Type II Diabetes, Osteoarthritis, Confinement Anxiety 08/11/2022 N/A N/A Date Acquired: 6 N/A N/A Weeks of Treatment: Open N/A N/A Wound Status: No N/A N/A Wound Recurrence: 0.2x0.2x0.2 N/A N/A Measurements L x W x D (cm) 0.031 N/A N/A A (cm) : rea 0.006 N/A N/A Volume (cm) : 98.40% N/A N/A % Reduction  in Area: 99.40% N/A N/A % Reduction in Volume: Full Thickness Without Exposed N/A N/A Classification: Support Structures Medium N/A N/A Exudate Amount: Serosanguineous N/A N/A Exudate Type: red, brown N/A N/A Exudate Color: Large (67-100%) N/A N/A Granulation Amount: Red N/A N/A Granulation Quality: Small (1-33%) N/A N/A Necrotic Amount: Fat Layer (Subcutaneous Tissue): Yes N/A N/A Exposed Structures: Fascia: No Tendon: No Muscle: No Joint: No Bone: No None N/A N/A Epithelialization: Scarring: Yes N/A N/A Periwound Skin Texture: Hemosiderin Staining: Yes N/A N/A Periwound Skin Color: No Abnormality N/A N/A Temperature: Treatment Notes Electronic Signature(s) Signed: 02/21/2023 4:29:36 PM By: Geralyn Corwin DO Entered By: Geralyn Corwin on 02/21/2023 15:50:54 -------------------------------------------------------------------------------- Multi-Disciplinary Care Plan Details Patient Name: Date of Service: Marissa Poet. 02/21/2023 3:00 PM Medical Record Number: [id number] Patient Account Number: [id number] Date of Birth/Sex: Treating RN: [name] (67 y.o. Orville Govern Primary Care Rishaan Gunner: Aliene Beams Other Clinician: MAISEE, KANAK ([id number]) [id number] Page 5 of 8 Referring Charletta Voight: Treating Delores Edelstein/Extender:  Marguerite Olea in Treatment: 6 Active Inactive Orientation to the Wound Care Program Nursing Diagnoses: Knowledge deficit related to the wound healing center program Goals: Patient/caregiver will verbalize understanding of the Wound Healing Center Program Date Initiated: 01/07/2023 Target Resolution Date: 01/21/2023 Goal Status: Active Interventions: Provide education on orientation to the wound center Notes: Wound/Skin Impairment Nursing Diagnoses: Impaired tissue integrity Knowledge deficit related to ulceration/compromised skin integrity Goals: Patient/caregiver will verbalize understanding of skin care regimen Date Initiated: 01/07/2023 Target Resolution Date: 02/25/2023 Goal Status: Active Ulcer/skin breakdown will have a volume reduction of 30% by week 4 Date Initiated: 01/07/2023 Target Resolution Date: 02/04/2023 Goal Status: Active Interventions: Assess patient/caregiver ability to obtain necessary supplies Assess patient/caregiver ability to perform ulcer/skin care regimen upon admission and as needed Assess ulceration(s) every visit Provide education on ulcer and skin care Notes: Electronic Signature(s) Signed: 02/21/2023 4:30:08 PM By: Redmond Pulling RN, BSN Entered By: Redmond Pulling on 02/21/2023 15:36:53 -------------------------------------------------------------------------------- Pain Assessment Details Patient Name: Date of Service: Marissa Poet. 02/21/2023 3:00 PM Medical Record Number: [id number] Patient Account Number: [id number] Date of Birth/Sex: Treating RN: [name], [name] (67 y.o. Orville Govern Primary Care Cresencia Asmus: Aliene Beams Other Clinician: Referring Arbell Wycoff: Treating Ryeleigh Santore/Extender: Marguerite Olea in Treatment: 6 Active Problems Location of Pain Severity and Description of Pain Patient Has Paino No Site Locations [name], [name] ([id number]) [id number]  Page 6 of 8 Pain Management and Medication Current Pain Management: Electronic Signature(s) Signed: 02/21/2023 4:30:08 PM By: Redmond Pulling RN, BSN Entered By: Redmond Pulling on 02/21/2023 15:31:25 -------------------------------------------------------------------------------- Patient/Caregiver Education Details Patient Name: Date of Service: Marissa Poet 8/5/2024andnbsp3:00 PM Medical Record Number: [id number] Patient Account Number: [id number] Date of Birth/Gender: Treating RN: [name] (67 y.o. Orville Govern Primary Care Physician: Aliene Beams Other Clinician: Referring Physician: Treating Physician/Extender: Marguerite Olea in Treatment: 6 Education Assessment Education Provided To: Patient Education Topics Provided Wound/Skin Impairment: Methods: Explain/Verbal Responses: State content correctly Electronic Signature(s) Signed: 02/21/2023 4:30:08 PM By: Redmond Pulling RN, BSN Entered By: Redmond Pulling on 02/21/2023 15:43:03 -------------------------------------------------------------------------------- Wound Assessment Details Patient Name: Date of Service: Marissa Poet 02/21/2023 3:00 PM Medical Record Number: [id number] Patient Account Number: [id number] Date of Birth/Sex: Treating RN: [birth year] (67 y.o. Orville Govern Primary Care Manraj Yeo: Aliene Beams Other Clinician: Referring Ayven Pheasant: Treating Imonie Tuch/Extender: Chandel, Dingmann ([id number]) [id number] Page 7 of 8 Weeks in Treatment: 6 Wound Status Wound Number: 1 Primary  Open Surgical Wound Etiology: Wound Location: Back Wound Open Wounding Event: Surgical Injury Status: Date Acquired: 08/11/2022 Comorbid Cataracts, Anemia, Hypertension, Type II Diabetes, Weeks Of Treatment: 6 History: Osteoarthritis, Confinement Anxiety Clustered Wound: No Photos Wound Measurements Length: (cm)  0.2 Width: (cm) 0.2 Depth: (cm) 0.2 Area: (cm) 0.031 Volume: (cm) 0.006 % Reduction in Area: 98.4% % Reduction in Volume: 99.4% Epithelialization: None Tunneling: No Undermining: No Wound Description Classification: Full Thickness Without Exposed Support Structures Exudate Amount: Medium Exudate Type: Serosanguineous Exudate Color: red, brown Foul Odor After Cleansing: No Slough/Fibrino Yes Wound Bed Granulation Amount: Large (67-100%) Exposed Structure Granulation Quality: Red Fascia Exposed: No Necrotic Amount: Small (1-33%) Fat Layer (Subcutaneous Tissue) Exposed: Yes Necrotic Quality: Adherent Slough Tendon Exposed: No Muscle Exposed: No Joint Exposed: No Bone Exposed: No Periwound Skin Texture Texture Color No Abnormalities Noted: No No Abnormalities Noted: No Scarring: Yes Hemosiderin Staining: Yes Moisture Temperature / Pain No Abnormalities Noted: No Temperature: No Abnormality Treatment Notes Wound #1 (Back) Cleanser Vashe 5.8 (oz) Discharge Instruction: Cleanse the wound with Vashe prior to applying a clean dressing using gauze sponges, not tissue or cotton balls. Peri-Wound Care Topical Primary Dressing MediHoney Gel, tube 1.5 (oz) Discharge Instruction: Apply to wound bed as instructed Aquacel Ag Dressing, 2x2 (in/in) Secondary Dressing Zetuvit Plus Silicone Border Dressing 4x4 (in/in) Discharge Instruction: Apply silicone border over primary dressing as directed. [name], [name] ([id number]) [id number] Page 8 of 8 Secured With Compression Wrap Compression Stockings Facilities manager) Signed: 02/21/2023 4:30:08 PM By: Redmond Pulling RN, BSN Entered By: Redmond Pulling on 02/21/2023 15:36:02 -------------------------------------------------------------------------------- Vitals Details Patient Name: Date of Service: Marissa Poet. 02/21/2023 3:00 PM Medical Record Number: [id number] Patient  Account Number: [id number] Date of Birth/Sex: Treating RN: [name][birth year] (67 y.o. Orville Govern Primary Care Nicholad Kautzman: Aliene Beams Other Clinician: Referring Lambert Jeanty: Treating Khamora Karan/Extender: Marguerite Olea in Treatment: 6 Vital Signs Time Taken: 15:30 Temperature (F): 98.1 Height (in): 61 Pulse (bpm): 85 Weight (lbs): 139 Respiratory Rate (breaths/min): 18 Body Mass Index (BMI): 26.3 Blood Pressure (mmHg): 104/68 Reference Range: 80 - 120 mg / dl Electronic Signature(s) Signed: 02/21/2023 4:30:08 PM By: Redmond Pulling RN, BSN Entered By: Redmond Pulling on 02/21/2023 15:31:16

## 2023-02-22 ENCOUNTER — Ambulatory Visit: Payer: Medicare Other | Admitting: Physical Therapy

## 2023-02-23 ENCOUNTER — Ambulatory Visit: Payer: Medicare Other | Admitting: Physical Therapy

## 2023-02-23 ENCOUNTER — Encounter: Payer: Self-pay | Admitting: Physical Therapy

## 2023-02-23 DIAGNOSIS — M6281 Muscle weakness (generalized): Secondary | ICD-10-CM

## 2023-02-23 DIAGNOSIS — R2689 Other abnormalities of gait and mobility: Secondary | ICD-10-CM

## 2023-02-23 DIAGNOSIS — R2681 Unsteadiness on feet: Secondary | ICD-10-CM

## 2023-02-23 NOTE — Therapy (Signed)
OUTPATIENT PHYSICAL THERAPY NEURO TREATMENT   Patient Name: [name] MRN: [id number] DOB:[birth year], 67 y.o., female Today's Date: 02/23/2023   PCP: Aliene Beams, MD REFERRING PROVIDER: Fanny Dance, MD  END OF SESSION:  PT End of Session - 02/23/23 1536     Visit Number 4    Number of Visits 17    Date for PT Re-Evaluation 04/08/23    Authorization Type BCBS Medicare    PT Start Time 1536    PT Stop Time 1615    PT Time Calculation (min) 39 min    Equipment Utilized During Treatment Gait belt   gait belt place low due to thoracic spine wound   Activity Tolerance Patient tolerated treatment well    Behavior During Therapy WFL for tasks assessed/performed               Past Medical History:  Diagnosis Date   Anemia    Anxiety    Arthritis    Bipolar disorder (HCC)    Depression    GERD (gastroesophageal reflux disease)    Heart murmur    "related to VSD"   High cholesterol    History of blood transfusion    "related to OR" (08/19/2016)   History of hiatal hernia    Hypertension    Hyperthyroidism    Mild cognitive impairment 09/06/2018   Paroxysmal ventricular tachycardia (HCC)    Type II diabetes mellitus (HCC)    UTI (urinary tract infection)    being treated with Keflex   Ventricular septal defect    Ventriculitis of brain due to bacteria 11/02/2022   Vertebral fracture, osteoporotic (HCC) 01/06/2023   Past Surgical History:  Procedure Laterality Date   ABDOMINAL HYSTERECTOMY     BACK SURGERY     CARDIAC CATHETERIZATION N/A 06/23/2015   Procedure: Left Heart Cath and Coronary Angiography;  Surgeon: Laurey Morale, MD;  Location: [facility] INVASIVE CV LAB;  Service: Cardiovascular;  Laterality: N/A;   CARDIAC CATHETERIZATION  1960   "VSD was so small; didn't need repaired"   EXAM UNDER ANESTHESIA WITH MANIPULATION OF HIP Right 06/02/2014   dr Jerl Santos   FRACTURE SURGERY     HERNIA REPAIR     HIP CLOSED REDUCTION Right 06/02/2014    Procedure: CLOSED MANIPULATION HIP;  Surgeon: Velna Ochs, MD;  Location: [facility] OR;  Service: Orthopedics;  Laterality: Right;   JOINT REPLACEMENT     JOINT REPLACEMENT     POSTERIOR LUMBAR FUSION 4 LEVEL N/A 06/26/2022   Procedure: Lumbar One To Lumbar Five Posterior Instrumented Fusion;  Surgeon: Jadene Pierini, MD;  Location: [facility] OR;  Service: Neurosurgery;  Laterality: N/A;   REFRACTIVE SURGERY Bilateral    SHOULDER ARTHROSCOPY Right    SHOULDER OPEN ROTATOR CUFF REPAIR Right    SPINAL FUSION  1996   "t10 down to my coccyx   SPINE HARDWARE REMOVAL     TOTAL ABDOMINAL HYSTERECTOMY     TOTAL HIP ARTHROPLASTY Right 05/10/2014   [email or web address]      by dr Cristal Deer olcott   TOTAL KNEE ARTHROPLASTY Left    TOTAL SHOULDER ARTHROPLASTY Left 08/19/2016   Procedure: TOTAL SHOULDER ARTHROPLASTY;  Surgeon: Jones Broom, MD;  Location: [facility] OR;  Service: Orthopedics;  Laterality: Left;  Left total shoulder replacement   Patient Active Problem List   Diagnosis Date Noted   Nonobstructive atherosclerosis of coronary artery 02/10/2023   Vertebral fracture, osteoporotic (HCC) 01/06/2023   Ventriculitis of brain due to bacteria 11/02/2022  Anemia of chronic illness 10/12/2022   Bipolar disorder, in partial remission, most recent episode depressed (HCC) 10/07/2022   Adjustment disorder with mixed anxiety and depressed mood 09/30/2022   Chronic wound 09/25/2022   Hyperglycemia 09/25/2022   Chronic midline thoracic back pain 09/17/2022   Primary insomnia 09/17/2022   Chronic combined systolic and diastolic CHF (congestive heart failure) (HCC) 09/14/2022   Acute blood loss anemia 09/09/2022   Infection of lumbar spine (HCC) 09/03/2022   Malnutrition of moderate degree 08/20/2022   Hardware complicating wound infection (HCC) 08/20/2022   Infection of deep incisional surgical site after procedure 08/20/2022   Aspiration pneumonia of both lungs (HCC) 08/19/2022   Acute respiratory failure with  hypoxia (HCC) 08/19/2022   Sepsis (HCC) 08/19/2022   Seizure (HCC) 08/19/2022   Wound dehiscence 08/09/2022   Delayed surgical wound healing 07/08/2022   Fracture of lumbar spine without cord injury (HCC) 06/26/2022   Lumbar vertebral fracture (HCC) 06/25/2022   Mood disorder (HCC) 09/29/2021   Mild cognitive impairment 09/06/2018   Hyponatremia 02/28/2018   S/P shoulder replacement, left 08/19/2016   Anxiety 10/17/2014   Acid reflux 10/17/2014   BP (high blood pressure) 10/17/2014   Arthritis, degenerative 10/17/2014   Adult hypothyroidism 10/17/2014   UTI (urinary tract infection) 06/03/2014   Fracture of bone adjacent to prosthesis 06/03/2014   Diabetes (HCC) 06/02/2014   Peri-prosthetic fracture of femur following total hip arthroplasty 06/02/2014   Urinary retention 05/13/2014   Chronic pain 05/10/2014   History of hip surgery 05/10/2014   UNSPECIFIED HEART FAILURE 06/24/2010   UNSPECIFIED CONGENITAL DEFECT OF SEPTAL CLOSURE 06/24/2010   TOBACCO ABUSE 03/18/2010   Secondary cardiomyopathy (HCC) 03/18/2010   DM 06/13/2009   HYPERTENSION, UNSPECIFIED 06/13/2009   VENTRICULAR TACHYCARDIA 06/13/2009   VENTRICULAR SEPTAL DEFECT, CONGENITAL 06/13/2009   Diabetes mellitus, type 2 (HCC) 06/13/2009   Essential (primary) hypertension 06/13/2009    ONSET DATE: 01/30/2023 (MD referral)  REFERRING DIAG: M46.26 (ICD-10-CM) - Infection of lumbar spine (HCC)   THERAPY DIAG:  Muscle weakness (generalized)  Unsteadiness on feet  Other abnormalities of gait and mobility  Rationale for Evaluation and Treatment: Rehabilitation  SUBJECTIVE:                                                                                                                                                                                             SUBJECTIVE STATEMENT: Had some people over for dinner last night.  I'm dragging a little today, mostly have just rested today.  MD was impressed by wound healing  on my back. Pt accompanied by: family member and daughter  PERTINENT HISTORY:  Hx of chronic wound (thoracic spine area) having had a postoperative surgery complicated by wound infection with CSF leak and ventriculitis with concern for hardware potentially being involved; osteomyelitis; spinal compression fracture (T10)   67 y.o. female with hx of scoliosis surgery with extensive lower thoracic and lumbar fusion roughly 62yrs ago, she suffered ground level fall in late November, where she fractures through all three columns of L3, thus went to OR on 12/9 for stabilization, new  posterolateral instrumentation fusion on June 26, 2022 complicated by Wound dehiscence with exposed hardware and unstable lumbar spine fracture status post revision of lumbar wound with removal of L1-3 4 and 5 lumbar screws and rod and removal of right L1 screw and revision of L1-L5 posterior instrumented fusion on August 09, 2022 and now concern for postoperative wound infection and ventriculitis. She had complicated protracted course in the icu, but eventually was discharged to CIR on vancomycin and cefepime through 3/14 for ventriculitis and hw complicating wound infection.    Mri of brain on 09/29/22- resolution of ventriculitis.  PAIN:  Are you having pain? Yes: NPRS scale: 2-3/10 Pain location: R hip Pain description: achy Aggravating factors: standing  Relieving factors: sitting  PRECAUTIONS: Back, Fall, and Other: hx of osteoporosis and fractures ; avoid twisting; avoid excess bending; is to log roll for bed mobility; (thoracic) wound being followed by wound care.  RED FLAGS: None   WEIGHT BEARING RESTRICTIONS: No  FALLS: Has patient fallen in last 6 months? Yes. Number of falls 1  LIVING ENVIRONMENT: Lives with: lives with their spouse; has caregiver at home; ramp to enter; one level home Lives in: House/apartment Stairs: Yes: Internal: 1 steps; none; family's homes have steps to get in Has following  equipment at home: Dan Humphreys - 2 wheeled, Wheelchair (manual), Shower bench, and bed side commode *has used shoe with lift in the distant past for Leg length discrepancy PLOF: Independent using cane  PATIENT GOALS: Wants to work on balance, walking, stairs, to get in and out of a car betterto be independent again  OBJECTIVE:       TODAY'S TREATMENT: 02/23/2023 Activity Comments  Gait x 63 ft with RW and min guard assist   LAQ 3 x 10,3# Cues for eccentric control  Seated march, 3 x 10 A/AROM for lifting and resistance into eccentric phase RLE and LLE, no resistance  Stand pivot transfer w/c<>mat Min guard assist  Seated ant pelvic tilt 5 reps C/o pain in hips (performed on mat)     Seated hamstring curl, 3 x 10 reps Red theraband, cues for eccentric control     Gait x 55 ft with RW, min guard assist       PATIENT EDUCATION: Education details: Highlighted importance of HEP Person educated: Patient Education method: Explanation and Demonstration Education comprehension: verbalized understanding and returned demonstration   -------------------------------------------------------------------------------------------------- Objective measures below taken at initial evaluation:  DIAGNOSTIC FINDINGS: CT lumbar spine 2022-12-21 IMPRESSION: 1. Pronounced chronic osteopenia. Prior decompression and fusion throughout the lumbar spine.   2. L3 superior endplate/superior body non-acute fracture AND subtle associated horizontal fracture through the chronic posterior element bone mass there (series 9, image 53 today). But no displacement over this series of exams. 3. Difficult to exclude nondisplaced S3/S4 sacral fracture, with mild presacral stranding there.   4. But no other acute osseous abnormality identified. And elsewhere the chronic T12 through sacral ankylosis/arthrodesis appears intact.   5. T10 compression fracture is detailed separately today on CT Thoracic  Spine.  COGNITION: Overall cognitive status: Within functional limits for tasks assessed   SENSATION: Light touch: WFL Reports numbness bottom of L foot, sometimes into lateral calf    POSTURE: rounded shoulders, weight shift right, and lateral lean towards R, L shoulder lower than R  LOWER EXTREMITY ROM:   WFL except decreased full Active R hip flexion  LOWER EXTREMITY MMT:    MMT Right Eval Left Eval  Hip flexion 3- 3  Hip extension    Hip abduction    Hip adduction    Hip internal rotation    Hip external rotation    Knee flexion 3+ 3+  Knee extension 3+ 4  Ankle dorsiflexion 3+ 3+  Ankle plantarflexion    Ankle inversion    Ankle eversion    (Blank rows = not tested)   TRANSFERS: Assistive device utilized: Environmental consultant - 2 wheeled  Sit to stand: CGA Stand to sit: CGA  GAIT: Gait pattern: step to pattern, step through pattern, decreased step length- Right, decreased step length- Left, Left hip hike, and knee flexed in stance- Left Distance walked: 35 ft Assistive device utilized: Walker - 2 wheeled Level of assistance: CGA Comments: stops x 1 due to fatigue  FUNCTIONAL TESTS:  10 meter walk test: attempted; unable to complete:  she ambulates 30 ft in 93.6 sec (0.32 ft/sec) Pt stands near walker, no UE support with supervision x 20-30 seconds  TODAY'S TREATMENT:                                                                                                                              DATE: 02/09/2023    PATIENT EDUCATION: Education details: Eval results, POC Person educated: Patient and Child(ren) Education method: Explanation Education comprehension: verbalized understanding  HOME EXERCISE PROGRAM: Access Code: [email or web address] URL: [email or web address] Date: 02/16/2023 Prepared by: Shary Decamp  Exercises - Seated Long Arc Quad with Ankle Weight  - 1 x daily - 7 x weekly - 3 sets - 10 reps - Seated Hamstring Curl with Anchored Resistance  - 1  x daily - 7 x weekly - 3 sets - 10 reps - Seated Hip Abduction with Resistance  - 1 x daily - 7 x weekly - 3 sets - 10 reps  GOALS: Goals reviewed with patient? Yes  SHORT TERM GOALS: Target date: 03/11/2023  Pt will be independent with HEP for improved strength, balance, gait. Baseline: Goal status: IN PROGRESS  2.  Pt will perform 5x sit<>stand test in 5 seconds less than baseline measure, to demo improved functional strength. Baseline: 17 sec w/ BUE push-off Goal status: IN PROGRESS   3.  Gait velocity to improve to 1 ft/sec for improved gait efficiency and safety. Baseline: 0.32 ft/sec Goal status: IN PROGRESS    LONG TERM GOALS: Target date: 04/08/2023  Pt will be independent with HEP for improved strength, balance, gait, decreased hip pain. Baseline:  Goal status: IN PROGRESS  2.  Pt will improve 5x  sit<>stand to less than or equal to 12 sec to demonstrate improved functional strength and transfer efficiency. Baseline:  Goal status: IN PROGRESS  3.  Pt will improve Berg score by at least 10 points from baseline to decrease fall risk. Baseline: 33/56 Goal status: IN PROGRESS  4.  Gait velocity to improve to 1.8 ft/sec for improved gait efficiency and safety Baseline:  Goal status: IN PROGRESS  5.  Pt will negotiate at least 3 steps with one handrail and min guard for improved negotiation into family's homes. Baseline:  Goal status: IN PROGRESS  6.  Pt will ambulate at least 500 ft with least restrictive device for improved community gait. Baseline:  Goal status:IN PROGRESS  ASSESSMENT:  CLINICAL IMPRESSION: Pt is able to walk longer distances in therapy today with RW.  She is able to increase reps of strengthening exercises to 3 sets of 10 with 3# weights.  She reports some muscle soreness after last session, but feels that's a good sign of muscles being used.  She does have better eccentric control today with seated lower extremity strengthening exercises.  She  will continue to progress strengthening and standing/gait activities towards goals.    OBJECTIVE IMPAIRMENTS: Abnormal gait, decreased balance, decreased mobility, difficulty walking, decreased strength, impaired flexibility, postural dysfunction, and pain.   ACTIVITY LIMITATIONS: bending, standing, stairs, transfers, bathing, toileting, dressing, hygiene/grooming, and locomotion level  PARTICIPATION LIMITATIONS: driving, shopping, and community activity  PERSONAL FACTORS: 3+ comorbidities: See PMH  are also affecting patient's functional outcome.   REHAB POTENTIAL: Good  CLINICAL DECISION MAKING: Evolving/moderate complexity  EVALUATION COMPLEXITY: Moderate  PLAN:  PT FREQUENCY: 2x/week  PT DURATION: 8 weeks plus eval week  PLANNED INTERVENTIONS: Therapeutic exercises, Therapeutic activity, Neuromuscular re-education, Balance training, Gait training, Patient/Family education, Self Care, Stair training, Orthotic/Fit training, and DME instructions  PLAN FOR NEXT SESSION: Continue to progress HEP for lower extremity strengthening. Likely need to bias for open chain PRE.  Work on sit<>stand from elevated surfaces    Lonia Blood, PT 02/23/23 5:10 PM Phone: [phone] Fax: [phone]  [facility] at [facility] [address], [address] [place], [place] [address] Phone # [phone] Fax # [phone]

## 2023-02-24 ENCOUNTER — Telehealth: Payer: Self-pay | Admitting: Physical Therapy

## 2023-02-24 DIAGNOSIS — M6281 Muscle weakness (generalized): Secondary | ICD-10-CM

## 2023-02-24 NOTE — Telephone Encounter (Signed)
Hello, [name] was evaluated by PT at our [facility] location.   She would also benefit from OT evaluation to address upper body strenghtening and full ADL participation.  If you agree, could you please write order for OT eval and treat?  * Of note, there is already an OT order; however, the new order will need appropriate OT diagnosis.  Thank you.    Lonia Blood, PT  [facility] at [facility] [address] [place], [address] [place], [place] [address] Phone # [phone] Fax # [phone]

## 2023-02-28 ENCOUNTER — Ambulatory Visit: Payer: Medicare Other | Admitting: Physical Therapy

## 2023-02-28 ENCOUNTER — Encounter: Payer: Self-pay | Admitting: Physical Therapy

## 2023-02-28 DIAGNOSIS — M6281 Muscle weakness (generalized): Secondary | ICD-10-CM

## 2023-02-28 DIAGNOSIS — R2681 Unsteadiness on feet: Secondary | ICD-10-CM

## 2023-02-28 DIAGNOSIS — R2689 Other abnormalities of gait and mobility: Secondary | ICD-10-CM

## 2023-02-28 NOTE — Therapy (Signed)
OUTPATIENT PHYSICAL THERAPY NEURO TREATMENT   Patient Name: [name] MRN: [id number] DOB:[birth year], 67 y.o., female Today's Date: 02/28/2023   PCP: Aliene Beams, MD REFERRING PROVIDER: Fanny Dance, MD  END OF SESSION:  PT End of Session - 02/28/23 1621     Visit Number 5    Number of Visits 17    Date for PT Re-Evaluation 04/08/23    Authorization Type BCBS Medicare    PT Start Time 1533    PT Stop Time 1612    PT Time Calculation (min) 39 min    Activity Tolerance Patient tolerated treatment well    Behavior During Therapy WFL for tasks assessed/performed                Past Medical History:  Diagnosis Date   Anemia    Anxiety    Arthritis    Bipolar disorder (HCC)    Depression    GERD (gastroesophageal reflux disease)    Heart murmur    "related to VSD"   High cholesterol    History of blood transfusion    "related to OR" (08/19/2016)   History of hiatal hernia    Hypertension    Hyperthyroidism    Mild cognitive impairment 09/06/2018   Paroxysmal ventricular tachycardia (HCC)    Type II diabetes mellitus (HCC)    UTI (urinary tract infection)    being treated with Keflex   Ventricular septal defect    Ventriculitis of brain due to bacteria 11/02/2022   Vertebral fracture, osteoporotic (HCC) 01/06/2023   Past Surgical History:  Procedure Laterality Date   ABDOMINAL HYSTERECTOMY     BACK SURGERY     CARDIAC CATHETERIZATION N/A 06/23/2015   Procedure: Left Heart Cath and Coronary Angiography;  Surgeon: Laurey Morale, MD;  Location: [facility] INVASIVE CV LAB;  Service: Cardiovascular;  Laterality: N/A;   CARDIAC CATHETERIZATION  1960   "VSD was so small; didn't need repaired"   EXAM UNDER ANESTHESIA WITH MANIPULATION OF HIP Right 06/02/2014   dr Jerl Santos   FRACTURE SURGERY     HERNIA REPAIR     HIP CLOSED REDUCTION Right 06/02/2014   Procedure: CLOSED MANIPULATION HIP;  Surgeon: Velna Ochs, MD;  Location: [facility] OR;  Service:  Orthopedics;  Laterality: Right;   JOINT REPLACEMENT     JOINT REPLACEMENT     POSTERIOR LUMBAR FUSION 4 LEVEL N/A 06/26/2022   Procedure: Lumbar One To Lumbar Five Posterior Instrumented Fusion;  Surgeon: Jadene Pierini, MD;  Location: [facility] OR;  Service: Neurosurgery;  Laterality: N/A;   REFRACTIVE SURGERY Bilateral    SHOULDER ARTHROSCOPY Right    SHOULDER OPEN ROTATOR CUFF REPAIR Right    SPINAL FUSION  1996   "t10 down to my coccyx   SPINE HARDWARE REMOVAL     TOTAL ABDOMINAL HYSTERECTOMY     TOTAL HIP ARTHROPLASTY Right 05/10/2014   [email or web address]      by dr Cristal Deer olcott   TOTAL KNEE ARTHROPLASTY Left    TOTAL SHOULDER ARTHROPLASTY Left 08/19/2016   Procedure: TOTAL SHOULDER ARTHROPLASTY;  Surgeon: Jones Broom, MD;  Location: [facility] OR;  Service: Orthopedics;  Laterality: Left;  Left total shoulder replacement   Patient Active Problem List   Diagnosis Date Noted   Nonobstructive atherosclerosis of coronary artery 02/10/2023   Vertebral fracture, osteoporotic (HCC) 01/06/2023   Ventriculitis of brain due to bacteria 11/02/2022   Anemia of chronic illness 10/12/2022   Bipolar disorder, in partial remission, most recent episode depressed (HCC) 10/07/2022  Adjustment disorder with mixed anxiety and depressed mood 09/30/2022   Chronic wound 09/25/2022   Hyperglycemia 09/25/2022   Chronic midline thoracic back pain 09/17/2022   Primary insomnia 09/17/2022   Chronic combined systolic and diastolic CHF (congestive heart failure) (HCC) 09/14/2022   Acute blood loss anemia 09/09/2022   Infection of lumbar spine (HCC) 09/03/2022   Malnutrition of moderate degree 08/20/2022   Hardware complicating wound infection (HCC) 08/20/2022   Infection of deep incisional surgical site after procedure 08/20/2022   Aspiration pneumonia of both lungs (HCC) 08/19/2022   Acute respiratory failure with hypoxia (HCC) 08/19/2022   Sepsis (HCC) 08/19/2022   Seizure (HCC) 08/19/2022   Wound  dehiscence 08/09/2022   Delayed surgical wound healing 07/08/2022   Fracture of lumbar spine without cord injury (HCC) 06/26/2022   Lumbar vertebral fracture (HCC) 06/25/2022   Mood disorder (HCC) 09/29/2021   Mild cognitive impairment 09/06/2018   Hyponatremia 02/28/2018   S/P shoulder replacement, left 08/19/2016   Anxiety 10/17/2014   Acid reflux 10/17/2014   BP (high blood pressure) 10/17/2014   Arthritis, degenerative 10/17/2014   Adult hypothyroidism 10/17/2014   UTI (urinary tract infection) 06/03/2014   Fracture of bone adjacent to prosthesis 06/03/2014   Diabetes (HCC) 06/02/2014   Peri-prosthetic fracture of femur following total hip arthroplasty 06/02/2014   Urinary retention 05/13/2014   Chronic pain 05/10/2014   History of hip surgery 05/10/2014   UNSPECIFIED HEART FAILURE 06/24/2010   UNSPECIFIED CONGENITAL DEFECT OF SEPTAL CLOSURE 06/24/2010   TOBACCO ABUSE 03/18/2010   Secondary cardiomyopathy (HCC) 03/18/2010   DM 06/13/2009   HYPERTENSION, UNSPECIFIED 06/13/2009   VENTRICULAR TACHYCARDIA 06/13/2009   VENTRICULAR SEPTAL DEFECT, CONGENITAL 06/13/2009   Diabetes mellitus, type 2 (HCC) 06/13/2009   Essential (primary) hypertension 06/13/2009    ONSET DATE: 01/30/2023 (MD referral)  REFERRING DIAG: M46.26 (ICD-10-CM) - Infection of lumbar spine (HCC)   THERAPY DIAG:  Muscle weakness (generalized)  Unsteadiness on feet  Other abnormalities of gait and mobility  Rationale for Evaluation and Treatment: Rehabilitation  SUBJECTIVE:                                                                                                                                                                                             SUBJECTIVE STATEMENT:  [facility] the [facility], stay really busy with so many doctors appointments and PT appointments. I've noted a new spot on my low back that's really uncomfortable I didn't notice it  before. I was without my flexeril for the past few days but took a flexeril about an hour ago  and feeling better.    Pt accompanied by: self, family dropped her off   PERTINENT HISTORY: Hx of chronic wound (thoracic spine area) having had a postoperative surgery complicated by wound infection with CSF leak and ventriculitis with concern for hardware potentially being involved; osteomyelitis; spinal compression fracture (T10)   67 y.o. female with hx of scoliosis surgery with extensive lower thoracic and lumbar fusion roughly 61yrs ago, she suffered ground level fall in late November, where she fractures through all three columns of L3, thus went to OR on 12/9 for stabilization, new  posterolateral instrumentation fusion on June 26, 2022 complicated by Wound dehiscence with exposed hardware and unstable lumbar spine fracture status post revision of lumbar wound with removal of L1-3 4 and 5 lumbar screws and rod and removal of right L1 screw and revision of L1-L5 posterior instrumented fusion on August 09, 2022 and now concern for postoperative wound infection and ventriculitis. She had complicated protracted course in the icu, but eventually was discharged to CIR on vancomycin and cefepime through 3/14 for ventriculitis and hw complicating wound infection.    Mri of brain on 09/29/22- resolution of ventriculitis.  PAIN:  Are you having pain? Yes: NPRS scale: 5/10 Pain location: lower R flank around fusion site  Pain description: throbbing  Aggravating factors: standing  Relieving factors: sitting  PRECAUTIONS: Back, Fall, and Other: hx of osteoporosis and fractures ; avoid twisting; avoid excess bending; is to log roll for bed mobility; (thoracic) wound being followed by wound care.  RED FLAGS: None   WEIGHT BEARING RESTRICTIONS: No  FALLS: Has patient fallen in last 6 months? Yes. Number of falls 1  LIVING ENVIRONMENT: Lives with: lives with their spouse; has caregiver at home; ramp to  enter; one level home Lives in: House/apartment Stairs: Yes: Internal: 1 steps; none; family's homes have steps to get in Has following equipment at home: Dan Humphreys - 2 wheeled, Wheelchair (manual), Shower bench, and bed side commode *has used shoe with lift in the distant past for Leg length discrepancy PLOF: Independent using cane  PATIENT GOALS: Wants to work on balance, walking, stairs, to get in and out of a car betterto be independent again  OBJECTIVE:       TODAY'S TREATMENT:   02/28/23  TherEx  Toileting with distant S Gait 46ft RW no rest breaks, 20ft with RW no rest   LAQs 4# 1X10 with slow lower  Standing hamstring curls x7 B 4#       02/23/2023 Activity Comments  Gait x 63 ft with RW and min guard assist   LAQ 3 x 10,3# Cues for eccentric control  Seated march, 3 x 10 A/AROM for lifting and resistance into eccentric phase RLE and LLE, no resistance  Stand pivot transfer w/c<>mat Min guard assist  Seated ant pelvic tilt 5 reps C/o pain in hips (performed on mat)     Seated hamstring curl, 3 x 10 reps Red theraband, cues for eccentric control     Gait x 55 ft with RW, min guard assist       PATIENT EDUCATION: Education details: Highlighted importance of HEP Person educated: Patient Education method: Explanation and Demonstration Education comprehension: verbalized understanding and returned demonstration   -------------------------------------------------------------------------------------------------- Objective measures below taken at initial evaluation:  DIAGNOSTIC FINDINGS: CT lumbar spine 28-Dec-2022 IMPRESSION: 1. Pronounced chronic osteopenia. Prior decompression and fusion throughout the lumbar spine.   2. L3 superior endplate/superior body non-acute fracture AND subtle associated horizontal fracture through the chronic posterior  element bone mass there (series 9, image 53 today). But no displacement over this series of exams. 3. Difficult to  exclude nondisplaced S3/S4 sacral fracture, with mild presacral stranding there.   4. But no other acute osseous abnormality identified. And elsewhere the chronic T12 through sacral ankylosis/arthrodesis appears intact.   5. T10 compression fracture is detailed separately today on CT Thoracic Spine.  COGNITION: Overall cognitive status: Within functional limits for tasks assessed   SENSATION: Light touch: WFL Reports numbness bottom of L foot, sometimes into lateral calf    POSTURE: rounded shoulders, weight shift right, and lateral lean towards R, L shoulder lower than R  LOWER EXTREMITY ROM:   WFL except decreased full Active R hip flexion  LOWER EXTREMITY MMT:    MMT Right Eval Left Eval  Hip flexion 3- 3  Hip extension    Hip abduction    Hip adduction    Hip internal rotation    Hip external rotation    Knee flexion 3+ 3+  Knee extension 3+ 4  Ankle dorsiflexion 3+ 3+  Ankle plantarflexion    Ankle inversion    Ankle eversion    (Blank rows = not tested)   TRANSFERS: Assistive device utilized: Environmental consultant - 2 wheeled  Sit to stand: CGA Stand to sit: CGA  GAIT: Gait pattern: step to pattern, step through pattern, decreased step length- Right, decreased step length- Left, Left hip hike, and knee flexed in stance- Left Distance walked: 35 ft Assistive device utilized: Walker - 2 wheeled Level of assistance: CGA Comments: stops x 1 due to fatigue  FUNCTIONAL TESTS:  10 meter walk test: attempted; unable to complete:  she ambulates 30 ft in 93.6 sec (0.32 ft/sec) Pt stands near walker, no UE support with supervision x 20-30 seconds  TODAY'S TREATMENT:                                                                                                                              DATE:       PATIENT EDUCATION: Education details: Eval results, POC Person educated: Patient and Child(ren) Education method: Explanation Education comprehension: verbalized  understanding  HOME EXERCISE PROGRAM: Access Code: [email or web address] URL: [email or web address] Date: 02/16/2023 Prepared by: Shary Decamp  Exercises - Seated Long Arc Quad with Ankle Weight  - 1 x daily - 7 x weekly - 3 sets - 10 reps - Seated Hamstring Curl with Anchored Resistance  - 1 x daily - 7 x weekly - 3 sets - 10 reps - Seated Hip Abduction with Resistance  - 1 x daily - 7 x weekly - 3 sets - 10 reps  GOALS: Goals reviewed with patient? Yes  SHORT TERM GOALS: Target date: 03/11/2023  Pt will be independent with HEP for improved strength, balance, gait. Baseline: Goal status: IN PROGRESS  2.  Pt will perform 5x sit<>stand test in 5 seconds less than baseline measure, to demo improved functional strength. Baseline: 17 sec  w/ BUE push-off Goal status: IN PROGRESS   3.  Gait velocity to improve to 1 ft/sec for improved gait efficiency and safety. Baseline: 0.32 ft/sec Goal status: IN PROGRESS    LONG TERM GOALS: Target date: 04/08/2023  Pt will be independent with HEP for improved strength, balance, gait, decreased hip pain. Baseline:  Goal status: IN PROGRESS  2.  Pt will improve 5x sit<>stand to less than or equal to 12 sec to demonstrate improved functional strength and transfer efficiency. Baseline:  Goal status: IN PROGRESS  3.  Pt will improve Berg score by at least 10 points from baseline to decrease fall risk. Baseline: 33/56 Goal status: IN PROGRESS  4.  Gait velocity to improve to 1.8 ft/sec for improved gait efficiency and safety Baseline:  Goal status: IN PROGRESS  5.  Pt will negotiate at least 3 steps with one handrail and min guard for improved negotiation into family's homes. Baseline:  Goal status: IN PROGRESS  6.  Pt will ambulate at least 500 ft with least restrictive device for improved community gait. Baseline:  Goal status:IN PROGRESS  ASSESSMENT:  CLINICAL IMPRESSION:  Kathie Rhodes arrives today doing well, she did really well in  PT today and was able to tolerate progression of gait distance and resistance work as well today. Did have some muscle fatigue and soreness with challenging activities but as expected. Really motivated to improve, we will continue to challenge her as appropriate.   OBJECTIVE IMPAIRMENTS: Abnormal gait, decreased balance, decreased mobility, difficulty walking, decreased strength, impaired flexibility, postural dysfunction, and pain.   ACTIVITY LIMITATIONS: bending, standing, stairs, transfers, bathing, toileting, dressing, hygiene/grooming, and locomotion level  PARTICIPATION LIMITATIONS: driving, shopping, and community activity  PERSONAL FACTORS: 3+ comorbidities: See PMH  are also affecting patient's functional outcome.   REHAB POTENTIAL: Good  CLINICAL DECISION MAKING: Evolving/moderate complexity  EVALUATION COMPLEXITY: Moderate  PLAN:  PT FREQUENCY: 2x/week  PT DURATION: 8 weeks plus eval week  PLANNED INTERVENTIONS: Therapeutic exercises, Therapeutic activity, Neuromuscular re-education, Balance training, Gait training, Patient/Family education, Self Care, Stair training, Orthotic/Fit training, and DME instructions  PLAN FOR NEXT SESSION: Continue to progress HEP for lower extremity strengthening. Likely need to bias for open chain PRE.  Work on sit<>stand from elevated surfaces    Nedra Hai, PT, DPT 02/28/23 4:21 PM   [facility] [address] [place], [address] [place], [place] [address] Phone # [phone] Fax # [phone]

## 2023-03-02 ENCOUNTER — Ambulatory Visit: Payer: Medicare Other

## 2023-03-02 DIAGNOSIS — R2689 Other abnormalities of gait and mobility: Secondary | ICD-10-CM

## 2023-03-02 DIAGNOSIS — M6281 Muscle weakness (generalized): Secondary | ICD-10-CM

## 2023-03-02 DIAGNOSIS — R2681 Unsteadiness on feet: Secondary | ICD-10-CM

## 2023-03-02 NOTE — Therapy (Signed)
OUTPATIENT PHYSICAL THERAPY NEURO TREATMENT   Patient Name: [name] MRN: [id number] DOB:1956-05-12, 67 y.o., female Today's Date: 03/02/2023   PCP: Aliene Beams, MD REFERRING PROVIDER: Fanny Dance, MD  END OF SESSION:  PT End of Session - 03/02/23 1617     Visit Number 6    Number of Visits 17    Date for PT Re-Evaluation 04/08/23    Authorization Type BCBS Medicare    PT Start Time 1615    PT Stop Time 1700    PT Time Calculation (min) 45 min    Activity Tolerance Patient tolerated treatment well    Behavior During Therapy WFL for tasks assessed/performed                Past Medical History:  Diagnosis Date   Anemia    Anxiety    Arthritis    Bipolar disorder (HCC)    Depression    GERD (gastroesophageal reflux disease)    Heart murmur    "related to VSD"   High cholesterol    History of blood transfusion    "related to OR" (08/19/2016)   History of hiatal hernia    Hypertension    Hyperthyroidism    Mild cognitive impairment 09/06/2018   Paroxysmal ventricular tachycardia (HCC)    Type II diabetes mellitus (HCC)    UTI (urinary tract infection)    being treated with Keflex   Ventricular septal defect    Ventriculitis of brain due to bacteria 11/02/2022   Vertebral fracture, osteoporotic (HCC) 01/06/2023   Past Surgical History:  Procedure Laterality Date   ABDOMINAL HYSTERECTOMY     BACK SURGERY     CARDIAC CATHETERIZATION N/A 06/23/2015   Procedure: Left Heart Cath and Coronary Angiography;  Surgeon: Laurey Morale, MD;  Location: [facility] INVASIVE CV LAB;  Service: Cardiovascular;  Laterality: N/A;   CARDIAC CATHETERIZATION  1960   "VSD was so small; didn't need repaired"   EXAM UNDER ANESTHESIA WITH MANIPULATION OF HIP Right 06/02/2014   dr Jerl Santos   FRACTURE SURGERY     HERNIA REPAIR     HIP CLOSED REDUCTION Right 06/02/2014   Procedure: CLOSED MANIPULATION HIP;  Surgeon: Velna Ochs, MD;  Location: [facility] OR;  Service:  Orthopedics;  Laterality: Right;   JOINT REPLACEMENT     JOINT REPLACEMENT     POSTERIOR LUMBAR FUSION 4 LEVEL N/A 06/26/2022   Procedure: Lumbar One To Lumbar Five Posterior Instrumented Fusion;  Surgeon: Jadene Pierini, MD;  Location: [facility] OR;  Service: Neurosurgery;  Laterality: N/A;   REFRACTIVE SURGERY Bilateral    SHOULDER ARTHROSCOPY Right    SHOULDER OPEN ROTATOR CUFF REPAIR Right    SPINAL FUSION  1996   "t10 down to my coccyx   SPINE HARDWARE REMOVAL     TOTAL ABDOMINAL HYSTERECTOMY     TOTAL HIP ARTHROPLASTY Right 05/10/2014   [email or web address]      by dr Cristal Deer olcott   TOTAL KNEE ARTHROPLASTY Left    TOTAL SHOULDER ARTHROPLASTY Left 08/19/2016   Procedure: TOTAL SHOULDER ARTHROPLASTY;  Surgeon: Jones Broom, MD;  Location: [facility] OR;  Service: Orthopedics;  Laterality: Left;  Left total shoulder replacement   Patient Active Problem List   Diagnosis Date Noted   Nonobstructive atherosclerosis of coronary artery 02/10/2023   Vertebral fracture, osteoporotic (HCC) 01/06/2023   Ventriculitis of brain due to bacteria 11/02/2022   Anemia of chronic illness 10/12/2022   Bipolar disorder, in partial remission, most recent episode depressed (HCC) 10/07/2022  Adjustment disorder with mixed anxiety and depressed mood 09/30/2022   Chronic wound 09/25/2022   Hyperglycemia 09/25/2022   Chronic midline thoracic back pain 09/17/2022   Primary insomnia 09/17/2022   Chronic combined systolic and diastolic CHF (congestive heart failure) (HCC) 09/14/2022   Acute blood loss anemia 09/09/2022   Infection of lumbar spine (HCC) 09/03/2022   Malnutrition of moderate degree 08/20/2022   Hardware complicating wound infection (HCC) 08/20/2022   Infection of deep incisional surgical site after procedure 08/20/2022   Aspiration pneumonia of both lungs (HCC) 08/19/2022   Acute respiratory failure with hypoxia (HCC) 08/19/2022   Sepsis (HCC) 08/19/2022   Seizure (HCC) 08/19/2022   Wound  dehiscence 08/09/2022   Delayed surgical wound healing 07/08/2022   Fracture of lumbar spine without cord injury (HCC) 06/26/2022   Lumbar vertebral fracture (HCC) 06/25/2022   Mood disorder (HCC) 09/29/2021   Mild cognitive impairment 09/06/2018   Hyponatremia 02/28/2018   S/P shoulder replacement, left 08/19/2016   Anxiety 10/17/2014   Acid reflux 10/17/2014   BP (high blood pressure) 10/17/2014   Arthritis, degenerative 10/17/2014   Adult hypothyroidism 10/17/2014   UTI (urinary tract infection) 06/03/2014   Fracture of bone adjacent to prosthesis 06/03/2014   Diabetes (HCC) 06/02/2014   Peri-prosthetic fracture of femur following total hip arthroplasty 06/02/2014   Urinary retention 05/13/2014   Chronic pain 05/10/2014   History of hip surgery 05/10/2014   UNSPECIFIED HEART FAILURE 06/24/2010   UNSPECIFIED CONGENITAL DEFECT OF SEPTAL CLOSURE 06/24/2010   TOBACCO ABUSE 03/18/2010   Secondary cardiomyopathy (HCC) 03/18/2010   DM 06/13/2009   HYPERTENSION, UNSPECIFIED 06/13/2009   VENTRICULAR TACHYCARDIA 06/13/2009   VENTRICULAR SEPTAL DEFECT, CONGENITAL 06/13/2009   Diabetes mellitus, type 2 (HCC) 06/13/2009   Essential (primary) hypertension 06/13/2009    ONSET DATE: 01/30/2023 (MD referral)  REFERRING DIAG: M46.26 (ICD-10-CM) - Infection of lumbar spine (HCC)   THERAPY DIAG:  Muscle weakness (generalized)  Unsteadiness on feet  Other abnormalities of gait and mobility  Rationale for Evaluation and Treatment: Rehabilitation  SUBJECTIVE:                                                                                                                                                                                             SUBJECTIVE STATEMENT:  [facility] [place] the [place] [facility], stay really busy with so many doctors appointments and PT appointments. I've noted a new spot on my low back that's really uncomfortable I didn't notice it  before. I was without my flexeril for the past few days but took a flexeril about an hour ago  and feeling better.    Pt accompanied by: self, family dropped her off   PERTINENT HISTORY: Hx of chronic wound (thoracic spine area) having had a postoperative surgery complicated by wound infection with CSF leak and ventriculitis with concern for hardware potentially being involved; osteomyelitis; spinal compression fracture (T10)   67 y.o. female with hx of scoliosis surgery with extensive lower thoracic and lumbar fusion roughly 11yrs ago, she suffered ground level fall in late November, where she fractures through all three columns of L3, thus went to OR on 12/9 for stabilization, new  posterolateral instrumentation fusion on June 26, 2022 complicated by Wound dehiscence with exposed hardware and unstable lumbar spine fracture status post revision of lumbar wound with removal of L1-3 4 and 5 lumbar screws and rod and removal of right L1 screw and revision of L1-L5 posterior instrumented fusion on August 09, 2022 and now concern for postoperative wound infection and ventriculitis. She had complicated protracted course in the icu, but eventually was discharged to CIR on vancomycin and cefepime through 3/14 for ventriculitis and hw complicating wound infection.    Mri of brain on 09/29/22- resolution of ventriculitis.  PAIN:  Are you having pain? Yes: NPRS scale: 5/10 Pain location: lower R flank around fusion site  Pain description: throbbing  Aggravating factors: standing  Relieving factors: sitting  PRECAUTIONS: Back, Fall, and Other: hx of osteoporosis and fractures ; avoid twisting; avoid excess bending; is to log roll for bed mobility; (thoracic) wound being followed by wound care.  RED FLAGS: None   WEIGHT BEARING RESTRICTIONS: No  FALLS: Has patient fallen in last 6 months? Yes. Number of falls 1  LIVING ENVIRONMENT: Lives with: lives with their spouse; has caregiver at home; ramp to  enter; one level home Lives in: House/apartment Stairs: Yes: Internal: 1 steps; none; family's homes have steps to get in Has following equipment at home: Dan Humphreys - 2 wheeled, Wheelchair (manual), Shower bench, and bed side commode *has used shoe with lift in the distant past for Leg length discrepancy PLOF: Independent using cane  PATIENT GOALS: Wants to work on balance, walking, stairs, to get in and out of a car betterto be independent again  OBJECTIVE:   TODAY'S TREATMENT: 03/02/23 Activity Comments  Seated [facility] PRE -LAQ 3x10 4# -hip abd red loop 3x10 -hip add iso 3x10 -hamstring curls red band 3x10  Gait training -RW level surfaces x 50 ft w/ SBA emphasis on turns -stair ambulation BHR 2x 6 steps and supervision for verbal cues -gait training level surfaces 2x85 ft w/ supervision--onset of fatigue, 2 min rest period between laps                   PATIENT EDUCATION: Education details: Highlighted importance of HEP Person educated: Patient Education method: Explanation and Demonstration Education comprehension: verbalized understanding and returned demonstration   -------------------------------------------------------------------------------------------------- Objective measures below taken at initial evaluation:  DIAGNOSTIC FINDINGS: CT lumbar spine 12/08/22 IMPRESSION: 1. Pronounced chronic osteopenia. Prior decompression and fusion throughout the lumbar spine.   2. L3 superior endplate/superior body non-acute fracture AND subtle associated horizontal fracture through the chronic posterior element bone mass there (series 9, image 53 today). But no displacement over this series of exams. 3. Difficult to exclude nondisplaced S3/S4 sacral fracture, with mild presacral stranding there.   4. But no other acute osseous abnormality identified. And elsewhere the chronic T12 through sacral ankylosis/arthrodesis appears intact.   5. T10 compression fracture is detailed  separately today on CT Thoracic  Spine.  COGNITION: Overall cognitive status: Within functional limits for tasks assessed   SENSATION: Light touch: WFL Reports numbness bottom of L foot, sometimes into lateral calf    POSTURE: rounded shoulders, weight shift right, and lateral lean towards R, L shoulder lower than R  LOWER EXTREMITY ROM:   WFL except decreased full Active R hip flexion  LOWER EXTREMITY MMT:    MMT Right Eval Left Eval  Hip flexion 3- 3  Hip extension    Hip abduction    Hip adduction    Hip internal rotation    Hip external rotation    Knee flexion 3+ 3+  Knee extension 3+ 4  Ankle dorsiflexion 3+ 3+  Ankle plantarflexion    Ankle inversion    Ankle eversion    (Blank rows = not tested)   TRANSFERS: Assistive device utilized: Environmental consultant - 2 wheeled  Sit to stand: CGA Stand to sit: CGA  GAIT: Gait pattern: step to pattern, step through pattern, decreased step length- Right, decreased step length- Left, Left hip hike, and knee flexed in stance- Left Distance walked: 35 ft Assistive device utilized: Walker - 2 wheeled Level of assistance: CGA Comments: stops x 1 due to fatigue  FUNCTIONAL TESTS:  10 meter walk test: attempted; unable to complete:  she ambulates 30 ft in 93.6 sec (0.32 ft/sec) Pt stands near walker, no UE support with supervision x 20-30 seconds  TODAY'S TREATMENT:                                                                                                                              DATE:       PATIENT EDUCATION: Education details: Eval results, POC Person educated: Patient and Child(ren) Education method: Explanation Education comprehension: verbalized understanding  HOME EXERCISE PROGRAM: Access Code: [email or web address] URL: [email or web address] Date: 02/16/2023 Prepared by: Shary Decamp  Exercises - Seated Long Arc Quad with Ankle Weight  - 1 x daily - 7 x weekly - 3 sets - 10 reps - Seated Hamstring Curl  with Anchored Resistance  - 1 x daily - 7 x weekly - 3 sets - 10 reps - Seated Hip Abduction with Resistance  - 1 x daily - 7 x weekly - 3 sets - 10 reps  GOALS: Goals reviewed with patient? Yes  SHORT TERM GOALS: Target date: 03/11/2023  Pt will be independent with HEP for improved strength, balance, gait. Baseline: Goal status: IN PROGRESS  2.  Pt will perform 5x sit<>stand test in 5 seconds less than baseline measure, to demo improved functional strength. Baseline: 17 sec w/ BUE push-off Goal status: IN PROGRESS   3.  Gait velocity to improve to 1 ft/sec for improved gait efficiency and safety. Baseline: 0.32 ft/sec Goal status: IN PROGRESS    LONG TERM GOALS: Target date: 04/08/2023  Pt will be independent with HEP for improved strength, balance, gait, decreased hip pain. Baseline:  Goal status: IN PROGRESS  2.  Pt will improve 5x sit<>stand to less than or equal to 12 sec to demonstrate improved functional strength and transfer efficiency. Baseline:  Goal status: IN PROGRESS  3.  Pt will improve Berg score by at least 10 points from baseline to decrease fall risk. Baseline: 33/56 Goal status: IN PROGRESS  4.  Gait velocity to improve to 1.8 ft/sec for improved gait efficiency and safety Baseline:  Goal status: IN PROGRESS  5.  Pt will negotiate at least 3 steps with one handrail and min guard for improved negotiation into family's homes. Baseline:  Goal status: IN PROGRESS  6.  Pt will ambulate at least 500 ft with least restrictive device for improved community gait. Baseline:  Goal status:IN PROGRESS  ASSESSMENT:  CLINICAL IMPRESSION: Continued with strength training with bias to increase in weight and open chain movements due to difficulty in weight bearing position from spinal deformity and leg length discrepancy tolerating very well.  Gait training with emphasis on increased distances and negotiation of steps requiring bilat HR and supervision in cues for  safety and sequence with step-to pattern.  Pt would benefit from continued sessions to advance POC details  OBJECTIVE IMPAIRMENTS: Abnormal gait, decreased balance, decreased mobility, difficulty walking, decreased strength, impaired flexibility, postural dysfunction, and pain.   ACTIVITY LIMITATIONS: bending, standing, stairs, transfers, bathing, toileting, dressing, hygiene/grooming, and locomotion level  PARTICIPATION LIMITATIONS: driving, shopping, and community activity  PERSONAL FACTORS: 3+ comorbidities: See PMH  are also affecting patient's functional outcome.   REHAB POTENTIAL: Good  CLINICAL DECISION MAKING: Evolving/moderate complexity  EVALUATION COMPLEXITY: Moderate  PLAN:  PT FREQUENCY: 2x/week  PT DURATION: 8 weeks plus eval week  PLANNED INTERVENTIONS: Therapeutic exercises, Therapeutic activity, Neuromuscular re-education, Balance training, Gait training, Patient/Family education, Self Care, Stair training, Orthotic/Fit training, and DME instructions  PLAN FOR NEXT SESSION: Continue to progress HEP for lower extremity strengthening. Likely need to bias for open chain PRE.  Work on sit<>stand from elevated surfaces    5:09 PM, 03/02/23 M. Shary Decamp, PT, DPT Physical Therapist- [place] Office Number: [phone]

## 2023-03-04 ENCOUNTER — Ambulatory Visit: Payer: Medicare Other | Admitting: Physical Medicine & Rehabilitation

## 2023-03-07 ENCOUNTER — Ambulatory Visit: Payer: Medicare Other | Admitting: Physical Therapy

## 2023-03-07 ENCOUNTER — Encounter: Payer: Self-pay | Admitting: Physical Therapy

## 2023-03-07 DIAGNOSIS — M6281 Muscle weakness (generalized): Secondary | ICD-10-CM | POA: Diagnosis not present

## 2023-03-07 DIAGNOSIS — R2681 Unsteadiness on feet: Secondary | ICD-10-CM

## 2023-03-07 DIAGNOSIS — R2689 Other abnormalities of gait and mobility: Secondary | ICD-10-CM

## 2023-03-07 NOTE — Therapy (Signed)
OUTPATIENT PHYSICAL THERAPY NEURO TREATMENT   Patient Name: [name] MRN: [id number] DOB:Apr 09, 1956, 67 y.o., female Today's Date: 03/08/2023   PCP: Aliene Beams, MD REFERRING PROVIDER: Fanny Dance, MD  END OF SESSION:  PT End of Session - 03/07/23 1536     Visit Number 7    Number of Visits 17    Date for PT Re-Evaluation 04/08/23    Authorization Type BCBS Medicare    PT Start Time 1534    PT Stop Time 1615    PT Time Calculation (min) 41 min    Activity Tolerance Patient tolerated treatment well    Behavior During Therapy WFL for tasks assessed/performed                 Past Medical History:  Diagnosis Date   Anemia    Anxiety    Arthritis    Bipolar disorder (HCC)    Depression    GERD (gastroesophageal reflux disease)    Heart murmur    "related to VSD"   High cholesterol    History of blood transfusion    "related to OR" (08/19/2016)   History of hiatal hernia    Hypertension    Hyperthyroidism    Mild cognitive impairment 09/06/2018   Paroxysmal ventricular tachycardia (HCC)    Type II diabetes mellitus (HCC)    UTI (urinary tract infection)    being treated with Keflex   Ventricular septal defect    Ventriculitis of brain due to bacteria 11/02/2022   Vertebral fracture, osteoporotic (HCC) 01/06/2023   Past Surgical History:  Procedure Laterality Date   ABDOMINAL HYSTERECTOMY     BACK SURGERY     CARDIAC CATHETERIZATION N/A 06/23/2015   Procedure: Left Heart Cath and Coronary Angiography;  Surgeon: Laurey Morale, MD;  Location: [facility] INVASIVE CV LAB;  Service: Cardiovascular;  Laterality: N/A;   CARDIAC CATHETERIZATION  1960   "VSD was so small; didn't need repaired"   EXAM UNDER ANESTHESIA WITH MANIPULATION OF HIP Right 06/02/2014   dr Jerl Santos   FRACTURE SURGERY     HERNIA REPAIR     HIP CLOSED REDUCTION Right 06/02/2014   Procedure: CLOSED MANIPULATION HIP;  Surgeon: Velna Ochs, MD;  Location: [facility] OR;  Service:  Orthopedics;  Laterality: Right;   JOINT REPLACEMENT     JOINT REPLACEMENT     POSTERIOR LUMBAR FUSION 4 LEVEL N/A 06/26/2022   Procedure: Lumbar One To Lumbar Five Posterior Instrumented Fusion;  Surgeon: Jadene Pierini, MD;  Location: [facility] OR;  Service: Neurosurgery;  Laterality: N/A;   REFRACTIVE SURGERY Bilateral    SHOULDER ARTHROSCOPY Right    SHOULDER OPEN ROTATOR CUFF REPAIR Right    SPINAL FUSION  1996   "t10 down to my coccyx   SPINE HARDWARE REMOVAL     TOTAL ABDOMINAL HYSTERECTOMY     TOTAL HIP ARTHROPLASTY Right 05/10/2014   [email or web address]      by dr Cristal Deer olcott   TOTAL KNEE ARTHROPLASTY Left    TOTAL SHOULDER ARTHROPLASTY Left 08/19/2016   Procedure: TOTAL SHOULDER ARTHROPLASTY;  Surgeon: Jones Broom, MD;  Location: [facility] OR;  Service: Orthopedics;  Laterality: Left;  Left total shoulder replacement   Patient Active Problem List   Diagnosis Date Noted   Nonobstructive atherosclerosis of coronary artery 02/10/2023   Vertebral fracture, osteoporotic (HCC) 01/06/2023   Ventriculitis of brain due to bacteria 11/02/2022   Anemia of chronic illness 10/12/2022   Bipolar disorder, in partial remission, most recent episode depressed (HCC)  10/07/2022   Adjustment disorder with mixed anxiety and depressed mood 09/30/2022   Chronic wound 09/25/2022   Hyperglycemia 09/25/2022   Chronic midline thoracic back pain 09/17/2022   Primary insomnia 09/17/2022   Chronic combined systolic and diastolic CHF (congestive heart failure) (HCC) 09/14/2022   Acute blood loss anemia 09/09/2022   Infection of lumbar spine (HCC) 09/03/2022   Malnutrition of moderate degree 08/20/2022   Hardware complicating wound infection (HCC) 08/20/2022   Infection of deep incisional surgical site after procedure 08/20/2022   Aspiration pneumonia of both lungs (HCC) 08/19/2022   Acute respiratory failure with hypoxia (HCC) 08/19/2022   Sepsis (HCC) 08/19/2022   Seizure (HCC) 08/19/2022   Wound  dehiscence 08/09/2022   Delayed surgical wound healing 07/08/2022   Fracture of lumbar spine without cord injury (HCC) 06/26/2022   Lumbar vertebral fracture (HCC) 06/25/2022   Mood disorder (HCC) 09/29/2021   Mild cognitive impairment 09/06/2018   Hyponatremia 02/28/2018   S/P shoulder replacement, left 08/19/2016   Anxiety 10/17/2014   Acid reflux 10/17/2014   BP (high blood pressure) 10/17/2014   Arthritis, degenerative 10/17/2014   Adult hypothyroidism 10/17/2014   UTI (urinary tract infection) 06/03/2014   Fracture of bone adjacent to prosthesis 06/03/2014   Diabetes (HCC) 06/02/2014   Peri-prosthetic fracture of femur following total hip arthroplasty 06/02/2014   Urinary retention 05/13/2014   Chronic pain 05/10/2014   History of hip surgery 05/10/2014   UNSPECIFIED HEART FAILURE 06/24/2010   UNSPECIFIED CONGENITAL DEFECT OF SEPTAL CLOSURE 06/24/2010   TOBACCO ABUSE 03/18/2010   Secondary cardiomyopathy (HCC) 03/18/2010   DM 06/13/2009   HYPERTENSION, UNSPECIFIED 06/13/2009   VENTRICULAR TACHYCARDIA 06/13/2009   VENTRICULAR SEPTAL DEFECT, CONGENITAL 06/13/2009   Diabetes mellitus, type 2 (HCC) 06/13/2009   Essential (primary) hypertension 06/13/2009    ONSET DATE: 01/30/2023 (MD referral)  REFERRING DIAG: M46.26 (ICD-10-CM) - Infection of lumbar spine (HCC)   THERAPY DIAG:  Muscle weakness (generalized)  Unsteadiness on feet  Other abnormalities of gait and mobility  Rationale for Evaluation and Treatment: Rehabilitation  SUBJECTIVE:                                                                                                                                                                                             SUBJECTIVE STATEMENT: Been walking a lot over the weekend (inside the house and outside of the house with walker), so I think that's why my low back is sore.  Pt accompanied by: self  PERTINENT HISTORY: Hx of chronic wound (thoracic spine area)  having had a postoperative surgery complicated by wound infection with CSF leak and ventriculitis  with concern for hardware potentially being involved; osteomyelitis; spinal compression fracture (T10)   67 y.o. female with hx of scoliosis surgery with extensive lower thoracic and lumbar fusion roughly 24yrs ago, she suffered ground level fall in late November, where she fractures through all three columns of L3, thus went to OR on 12/9 for stabilization, new  posterolateral instrumentation fusion on June 26, 2022 complicated by Wound dehiscence with exposed hardware and unstable lumbar spine fracture status post revision of lumbar wound with removal of L1-3 4 and 5 lumbar screws and rod and removal of right L1 screw and revision of L1-L5 posterior instrumented fusion on August 09, 2022 and now concern for postoperative wound infection and ventriculitis. She had complicated protracted course in the icu, but eventually was discharged to CIR on vancomycin and cefepime through 3/14 for ventriculitis and hw complicating wound infection.    Mri of brain on 09/29/22- resolution of ventriculitis.  PAIN:  Are you having pain? Yes: NPRS scale: 6/10 Pain location: lower R flank around fusion site  Pain description: throbbing  Aggravating factors: standing  Relieving factors: sitting  PRECAUTIONS: Back, Fall, and Other: hx of osteoporosis and fractures ; avoid twisting; avoid excess bending; is to log roll for bed mobility; (thoracic) wound being followed by wound care.  RED FLAGS: None   WEIGHT BEARING RESTRICTIONS: No  FALLS: Has patient fallen in last 6 months? Yes. Number of falls 1  LIVING ENVIRONMENT: Lives with: lives with their spouse; has caregiver at home; ramp to enter; one level home Lives in: House/apartment Stairs: Yes: Internal: 1 steps; none; family's homes have steps to get in Has following equipment at home: Dan Humphreys - 2 wheeled, Wheelchair (manual), Shower bench, and bed side  commode *has used shoe with lift in the distant past for Leg length discrepancy PLOF: Independent using cane  PATIENT GOALS: Wants to work on balance, walking, stairs, to get in and out of a car betterto be independent again  OBJECTIVE:       TODAY'S TREATMENT: 03/07/23 Activity Comments  Seated [place] PRE -LAQ 3x10 4# -hamstring curls green band 3x10 -hip flexion, 4# 3 x 10 (assistance with R hip flexion)  Supine hooklying march With assistance, unable to perform against gravity  Supine RLE quad sets 2 x 10 reps, 3" hold  Seated heel digs for hamstring activation EOM  Gait training -RW level surfaces x 50 ft w/ SBA emphasis on turns     Access Code: [email or web address] URL: [email or web address] Date: 03/07/2023 Prepared by: [facility] - Outpatient  Rehab - [facility]  Exercises - Seated Long Arc Quad with Ankle Weight  - 1 x daily - 7 x weekly - 3 sets - 10 reps - Seated Hamstring Curl with Anchored Resistance  - 1 x daily - 7 x weekly - 3 sets - 10 reps - Seated Hip Abduction with Resistance  - 1 x daily - 7 x weekly - 3 sets - 10 reps - Supine Quad Set  - 1 x daily - 7 x weekly - 3 sets - 10 reps   PATIENT EDUCATION: Education details: Continued to highlight importance of HEP, addition of quad sets to HEP Person educated: Patient Education method: Explanation and Demonstration Education comprehension: verbalized understanding and returned demonstration   -------------------------------------------------------------------------------------------------- Objective measures below taken at initial evaluation:  DIAGNOSTIC FINDINGS: CT lumbar spine 22-Dec-2022 IMPRESSION: 1. Pronounced chronic osteopenia. Prior decompression and fusion throughout the lumbar spine.   2. L3 superior endplate/superior body non-acute fracture AND subtle  associated horizontal fracture through the chronic posterior element bone mass there (series 9, image 53 today). But no displacement  over this series of exams. 3. Difficult to exclude nondisplaced S3/S4 sacral fracture, with mild presacral stranding there.   4. But no other acute osseous abnormality identified. And elsewhere the chronic T12 through sacral ankylosis/arthrodesis appears intact.   5. T10 compression fracture is detailed separately today on CT Thoracic Spine.  COGNITION: Overall cognitive status: Within functional limits for tasks assessed   SENSATION: Light touch: WFL Reports numbness bottom of L foot, sometimes into lateral calf    POSTURE: rounded shoulders, weight shift right, and lateral lean towards R, L shoulder lower than R  LOWER EXTREMITY ROM:   WFL except decreased full Active R hip flexion  LOWER EXTREMITY MMT:    MMT Right Eval Left Eval  Hip flexion 3- 3  Hip extension    Hip abduction    Hip adduction    Hip internal rotation    Hip external rotation    Knee flexion 3+ 3+  Knee extension 3+ 4  Ankle dorsiflexion 3+ 3+  Ankle plantarflexion    Ankle inversion    Ankle eversion    (Blank rows = not tested)   TRANSFERS: Assistive device utilized: Environmental consultant - 2 wheeled  Sit to stand: CGA Stand to sit: CGA  GAIT: Gait pattern: step to pattern, step through pattern, decreased step length- Right, decreased step length- Left, Left hip hike, and knee flexed in stance- Left Distance walked: 35 ft Assistive device utilized: Walker - 2 wheeled Level of assistance: CGA Comments: stops x 1 due to fatigue  FUNCTIONAL TESTS:  10 meter walk test: attempted; unable to complete:  she ambulates 30 ft in 93.6 sec (0.32 ft/sec) Pt stands near walker, no UE support with supervision x 20-30 seconds  TODAY'S TREATMENT:                                                                                                                              DATE:       PATIENT EDUCATION: Education details: Eval results, POC Person educated: Patient and Child(ren) Education method:  Explanation Education comprehension: verbalized understanding  HOME EXERCISE PROGRAM: Access Code: [email or web address] URL: [email or web address] Date: 02/16/2023 Prepared by: Shary Decamp  Exercises - Seated Long Arc Quad with Ankle Weight  - 1 x daily - 7 x weekly - 3 sets - 10 reps - Seated Hamstring Curl with Anchored Resistance  - 1 x daily - 7 x weekly - 3 sets - 10 reps - Seated Hip Abduction with Resistance  - 1 x daily - 7 x weekly - 3 sets - 10 reps  GOALS: Goals reviewed with patient? Yes  SHORT TERM GOALS: Target date: 03/11/2023  Pt will be independent with HEP for improved strength, balance, gait. Baseline: Goal status: IN PROGRESS  2.  Pt will perform 5x sit<>stand test in 5 seconds less than baseline measure, to  demo improved functional strength. Baseline: 17 sec w/ BUE push-off Goal status: IN PROGRESS   3.  Gait velocity to improve to 1 ft/sec for improved gait efficiency and safety. Baseline: 0.32 ft/sec Goal status: IN PROGRESS    LONG TERM GOALS: Target date: 04/08/2023  Pt will be independent with HEP for improved strength, balance, gait, decreased hip pain. Baseline:  Goal status: IN PROGRESS  2.  Pt will improve 5x sit<>stand to less than or equal to 12 sec to demonstrate improved functional strength and transfer efficiency. Baseline:  Goal status: IN PROGRESS  3.  Pt will improve Berg score by at least 10 points from baseline to decrease fall risk. Baseline: 33/56 Goal status: IN PROGRESS  4.  Gait velocity to improve to 1.8 ft/sec for improved gait efficiency and safety Baseline:  Goal status: IN PROGRESS  5.  Pt will negotiate at least 3 steps with one handrail and min guard for improved negotiation into family's homes. Baseline:  Goal status: IN PROGRESS  6.  Pt will ambulate at least 500 ft with least restrictive device for improved community gait. Baseline:  Goal status:IN PROGRESS  ASSESSMENT:  CLINICAL IMPRESSION: Skilled  PT session today continued to focus on strength training, open chain seated and supine.  Pt able to increase hamstring curl in sitting from red to green theraband resistance.  Attempted supine hooklying marching to try to improve hip flexion activation, but pt continues to have difficulty with hip flexion activation.  She begins to have pain in low back due to excessive use of low back musculature with attempts at hooklying march.  She is able to perform supine quad sets well, to work on quad activation/strengthening.  She will continue to benefit from skilled PT towards goals for improved functional mobility and independence.  OBJECTIVE IMPAIRMENTS: Abnormal gait, decreased balance, decreased mobility, difficulty walking, decreased strength, impaired flexibility, postural dysfunction, and pain.   ACTIVITY LIMITATIONS: bending, standing, stairs, transfers, bathing, toileting, dressing, hygiene/grooming, and locomotion level  PARTICIPATION LIMITATIONS: driving, shopping, and community activity  PERSONAL FACTORS: 3+ comorbidities: See PMH  are also affecting patient's functional outcome.   REHAB POTENTIAL: Good  CLINICAL DECISION MAKING: Evolving/moderate complexity  EVALUATION COMPLEXITY: Moderate  PLAN:  PT FREQUENCY: 2x/week  PT DURATION: 8 weeks plus eval week  PLANNED INTERVENTIONS: Therapeutic exercises, Therapeutic activity, Neuromuscular re-education, Balance training, Gait training, Patient/Family education, Self Care, Stair training, Orthotic/Fit training, and DME instructions  PLAN FOR NEXT SESSION: Check STGs.  Work on core stability and strengthening.  Continue to progress HEP for lower extremity strengthening. Likely need to bias for open chain PRE.  Work on sit<>stand from elevated surfaces    Lonia Blood, PT 03/08/23 7:49 AM Phone: [phone] Fax: [phone]  [facility] at [facility] [address] [place], [address] [place], [place]  [address] Phone # [phone] Fax # [phone]

## 2023-03-08 ENCOUNTER — Encounter: Payer: Self-pay | Admitting: Physical Therapy

## 2023-03-08 ENCOUNTER — Ambulatory Visit (HOSPITAL_COMMUNITY): Payer: Medicare Other | Attending: Cardiology

## 2023-03-08 DIAGNOSIS — Q210 Ventricular septal defect: Secondary | ICD-10-CM | POA: Diagnosis not present

## 2023-03-08 DIAGNOSIS — I361 Nonrheumatic tricuspid (valve) insufficiency: Secondary | ICD-10-CM | POA: Diagnosis not present

## 2023-03-08 LAB — ECHOCARDIOGRAM COMPLETE
AR max vel: 2.25 cm2
AV Area VTI: 2.37 cm2
AV Area mean vel: 2.22 cm2
AV Mean grad: 4 mmHg
AV Peak grad: 7.6 mmHg
Ao pk vel: 1.38 m/s
Area-P 1/2: 4.85 cm2
P 1/2 time: 353 msec
S' Lateral: 4.15 cm

## 2023-03-09 ENCOUNTER — Ambulatory Visit (INDEPENDENT_AMBULATORY_CARE_PROVIDER_SITE_OTHER): Payer: Medicare Other | Admitting: Infectious Disease

## 2023-03-09 ENCOUNTER — Ambulatory Visit: Payer: Medicare Other

## 2023-03-09 ENCOUNTER — Other Ambulatory Visit: Payer: Self-pay

## 2023-03-09 ENCOUNTER — Encounter: Payer: Self-pay | Admitting: Infectious Disease

## 2023-03-09 VITALS — BP 103/66 | HR 79 | Temp 98.0°F | Wt 138.0 lb

## 2023-03-09 DIAGNOSIS — R2681 Unsteadiness on feet: Secondary | ICD-10-CM

## 2023-03-09 DIAGNOSIS — G0490 Encephalitis and encephalomyelitis, unspecified: Secondary | ICD-10-CM

## 2023-03-09 DIAGNOSIS — T847XXD Infection and inflammatory reaction due to other internal orthopedic prosthetic devices, implants and grafts, subsequent encounter: Secondary | ICD-10-CM

## 2023-03-09 DIAGNOSIS — M810 Age-related osteoporosis without current pathological fracture: Secondary | ICD-10-CM

## 2023-03-09 DIAGNOSIS — R21 Rash and other nonspecific skin eruption: Secondary | ICD-10-CM

## 2023-03-09 DIAGNOSIS — T847XXS Infection and inflammatory reaction due to other internal orthopedic prosthetic devices, implants and grafts, sequela: Secondary | ICD-10-CM

## 2023-03-09 DIAGNOSIS — T364X5A Adverse effect of tetracyclines, initial encounter: Secondary | ICD-10-CM

## 2023-03-09 DIAGNOSIS — M6281 Muscle weakness (generalized): Secondary | ICD-10-CM | POA: Diagnosis not present

## 2023-03-09 DIAGNOSIS — R2689 Other abnormalities of gait and mobility: Secondary | ICD-10-CM

## 2023-03-09 DIAGNOSIS — B9689 Other specified bacterial agents as the cause of diseases classified elsewhere: Secondary | ICD-10-CM

## 2023-03-09 NOTE — Progress Notes (Signed)
Subjective:  Chief complaint: For follow-up for chronic wound having had a postoperative surgery complicated by wound infection with CSF leak and ventriculitis with concern for hardware potentially being involved   Patient ID: [name], female    DOB: 1956/03/17, 67 y.o.   MRN: [id number]  HPI   67 y.o. female with hx of scoliosis surgery with extensive lower thoracic and lumbar fusion roughly 30yrs ago, she suffered ground level fall in late November, where she fractures through all three columns of L3, thus went to OR on 12/9 for stabilization, new  posterolateral instrumentation fusion on June 26, 2022 complicated by Wound dehiscence with exposed hardware and unstable lumbar spine fracture status post revision of lumbar wound with removal of L1-3 4 and 5 lumbar screws and rod and removal of right L1 screw and revision of L1-L5 posterior instrumented fusion on August 09, 2022 and now concern for postoperative wound infection and ventriculitis. She had complicated protracted course in the icu, but eventually was discharged to CIR on vancomycin and cefepime through 3/14 for ventriculitis and hw complicating wound infection. Now changed to doxcycyline plus levofloxacin as of 3/15 while she continued to need medihoney with moistened quaze with sliver hydrofiber to the caudal portion of wound.   Mri of brain on 09/29/22- resolution of ventriculitis.   She was seen by my partner Dr. Drue Second while she was in the inpatient rehab unit.  Follow-up was rescheduled for May for this visit.  Back pain did not appear to be worse but wound does persist and apparently tunnels cranially.  Her white blood cell count with [facility] was in the 16,000 range an abrupt change from her last labs as an inpatient when it was in the 7000 range.  He did have a mechanical fall and fell on her left knee where she has a prosthetic joint.  When I last saw her we rechecked her inflammatory markers and sed  rate had titrated down though CRP was going up to 15.6  WBC recheck with our lab was 12,000.  In the interim she has been seen by Dr. Johnsie Cancel with neurosurgery and CT of the thoracic and lumbar spine were performed.  These have shown: A new T10 compression fracture with 12% loss of height with unchanged change adjacent chronic T10-T11 T12-L1 posterior element ankylosis with chronic nonfusion at T11-T12, L3 superior endplate nonacute fracture and subtle horizontal fracture of the chronic posterior element of the bone, difficult exclude nondisplaced S3-4 sacral fracture with mild presacral stranding.   Checked labs last time we saw her white blood cell count remained elevated 12,000.  Sed rate and CRP [place] sed rate actually normalized and CRP had gone up.  He did have changing her thumb levofloxacin and doxycycline to cefdinir and doxycycline.  She is continuing to take these.  She did develop a painful erythematous rash on her hands due to doxycycline use in the context of sun exposure at the beach.  This is subsequently resolved she has a new somewhat purpuric rash on her right arm that came up in the last few days which she is curious about.  She is also been wanting to see an endocrinologist to help with evaluation of her osteoporosis.  Apparently Dr. Johnsie Cancel was going to do that but she has not yet heard back so I offered to make a referral here in the epic system.     Past Medical History:  Diagnosis Date   Anemia    Anxiety  Arthritis    Bipolar disorder (HCC)    Depression    GERD (gastroesophageal reflux disease)    Heart murmur    "related to VSD"   High cholesterol    History of blood transfusion    "related to OR" (08/19/2016)   History of hiatal hernia    Hypertension    Hyperthyroidism    Mild cognitive impairment 09/06/2018   Paroxysmal ventricular tachycardia (HCC)    Type II diabetes mellitus (HCC)    UTI (urinary tract infection)    being treated  with Keflex   Ventricular septal defect    Ventriculitis of brain due to bacteria 11/02/2022   Vertebral fracture, osteoporotic (HCC) 01/06/2023    Past Surgical History:  Procedure Laterality Date   ABDOMINAL HYSTERECTOMY     BACK SURGERY     CARDIAC CATHETERIZATION N/A 06/23/2015   Procedure: Left Heart Cath and Coronary Angiography;  Surgeon: Laurey Morale, MD;  Location: [facility] INVASIVE CV LAB;  Service: Cardiovascular;  Laterality: N/A;   CARDIAC CATHETERIZATION  1960   "VSD was so small; didn't need repaired"   EXAM UNDER ANESTHESIA WITH MANIPULATION OF HIP Right 06/02/2014   dr Jerl Santos   FRACTURE SURGERY     HERNIA REPAIR     HIP CLOSED REDUCTION Right 06/02/2014   Procedure: CLOSED MANIPULATION HIP;  Surgeon: Velna Ochs, MD;  Location: [facility] OR;  Service: Orthopedics;  Laterality: Right;   JOINT REPLACEMENT     JOINT REPLACEMENT     POSTERIOR LUMBAR FUSION 4 LEVEL N/A 06/26/2022   Procedure: Lumbar One To Lumbar Five Posterior Instrumented Fusion;  Surgeon: Jadene Pierini, MD;  Location: [facility] OR;  Service: Neurosurgery;  Laterality: N/A;   REFRACTIVE SURGERY Bilateral    SHOULDER ARTHROSCOPY Right    SHOULDER OPEN ROTATOR CUFF REPAIR Right    SPINAL FUSION  1996   "t10 down to my coccyx   SPINE HARDWARE REMOVAL     TOTAL ABDOMINAL HYSTERECTOMY     TOTAL HIP ARTHROPLASTY Right 05/10/2014   [facility]      by dr Cristal Deer olcott   TOTAL KNEE ARTHROPLASTY Left    TOTAL SHOULDER ARTHROPLASTY Left 08/19/2016   Procedure: TOTAL SHOULDER ARTHROPLASTY;  Surgeon: Jones Broom, MD;  Location: [facility] OR;  Service: Orthopedics;  Laterality: Left;  Left total shoulder replacement    Family History  Problem Relation Age of Onset   Other Mother        alive   Stroke Father 15       deceased   Dementia Father    Chorea Maternal Grandfather    Dementia Maternal Aunt    Dementia Maternal Aunt    Heart attack Other        multiple uncles have died with myocardial  infarction      Social History   Socioeconomic History   Marital status: Married    Spouse name: Not on file   Number of children: Not on file   Years of education: Not on file   Highest education level: Master's degree (e.g., MA, MS, MEng, MEd, MSW, MBA)  Occupational History   Occupation: Magazine features editor: SELF-EMPLOYED    Comment: former  Tobacco Use   Smoking status: Former    Types: Cigarettes    Passive exposure: Never   Smokeless tobacco: Never   Tobacco comments:    03/30/21 smokes 2 cigs daily.      08/06/22 Shanda Bumps stated that the patient is not smoking  while at [facility] for rehab. [facility]  Vaping Use   Vaping status: Never Used  Substance and Sexual Activity   Alcohol use: No   Drug use: No   Sexual activity: Not Currently    Birth control/protection: Surgical    Comment: Hysterectomy  Other Topics Concern   Not on file  Social History Narrative   Right handed   Caffeine 2-3 cups daily    Lives at home with husband    Social Determinants of Health   Financial Resource Strain: Not on file  Food Insecurity: No Food Insecurity (06/27/2022)   Hunger Vital Sign    Worried About Running Out of Food in the Last Year: Never true    Ran Out of Food in the Last Year: Never true  Transportation Needs: No Transportation Needs (06/27/2022)   PRAPARE - Administrator, Civil Service (Medical): No    Lack of Transportation (Non-Medical): No  Physical Activity: Not on file  Stress: Not on file  Social Connections: Not on file    No Known Allergies   Current Outpatient Medications:    acetaminophen (TYLENOL) 325 MG tablet, Take 1-2 tablets (325-650 mg total) by mouth every 6 (six) hours as needed for mild pain or headache., Disp: , Rfl:    buprenorphine (BUTRANS) 5 MCG/HR PTWK, Place 1 patch onto the skin once a week., Disp: 4 patch, Rfl: 0   buPROPion (WELLBUTRIN XL) 150 MG 24 hr tablet, Take 1 tablet (150 mg total) by mouth daily., Disp: 30 tablet,  Rfl: 0   carvedilol (COREG) 25 MG tablet, Take 1 tablet (25 mg total) by mouth 2 (two) times daily with a meal., Disp: 60 tablet, Rfl: 0   cefdinir (OMNICEF) 300 MG capsule, Take 1 capsule (300 mg total) by mouth 2 (two) times daily., Disp: 60 capsule, Rfl: 5   collagenase (SANTYL) 250 UNIT/GM ointment, Apply 1 Application topically daily. Apply 2-3 grams to affected area daily., Disp: 30 g, Rfl: 0   cyclobenzaprine (FLEXERIL) 10 MG tablet, Take 1 tablet (10 mg total) by mouth 3 (three) times daily., Disp: 90 tablet, Rfl: 0   diclofenac Sodium (VOLTAREN) 1 % GEL, Apply 2 g topically 4 (four) times daily., Disp: 350 g, Rfl: 0   diphenoxylate-atropine (LOMOTIL) 2.5-0.025 MG tablet, Take 1 tablet by mouth 2 (two) times daily as needed for diarrhea or loose stools., Disp: 30 tablet, Rfl: 0   doxycycline (VIBRA-TABS) 100 MG tablet, Take 1 tablet (100 mg total) by mouth every 12 (twelve) hours., Disp: 60 tablet, Rfl: 4   empagliflozin (JARDIANCE) 10 MG TABS tablet, Take 1 tablet (10 mg total) by mouth daily before breakfast., Disp: 90 tablet, Rfl: 3   famotidine (PEPCID) 20 MG tablet, Take 1 tablet (20 mg total) by mouth 2 (two) times daily., Disp: 60 tablet, Rfl: 0   fluvoxaMINE (LUVOX) 100 MG tablet, Take 1 tablet (100 mg total) by mouth at bedtime., Disp: 30 tablet, Rfl: 0   gabapentin (NEURONTIN) 100 MG capsule, Take 1 capsule (100 mg total) by mouth 3 (three) times daily., Disp: 90 capsule, Rfl: 0   Lactobacillus (ACIDOPHILUS) 100 MG CAPS, Take 1 capsule (100 mg total) by mouth 2 (two) times daily., Disp: 60 capsule, Rfl: 0   lamoTRIgine (LAMICTAL) 200 MG tablet, Take 1 tablet (200 mg total) by mouth daily., Disp: 30 tablet, Rfl: 0   levothyroxine (SYNTHROID) 88 MCG tablet, Take 1 tablet (88 mcg total) by mouth daily before breakfast., Disp: 30 tablet, Rfl:  0   losartan (COZAAR) 25 MG tablet, Take 1 tablet (25 mg total) by mouth at bedtime., Disp: 30 tablet, Rfl: 0   melatonin 5 MG TABS, Take 1 tablet  (5 mg total) by mouth at bedtime as needed., Disp: 30 tablet, Rfl: 0   metFORMIN (GLUCOPHAGE) 500 MG tablet, Take 1 tablet (500 mg total) by mouth 2 (two) times daily with a meal., Disp: 60 tablet, Rfl: 0   Multiple Vitamin (MULTIVITAMIN) tablet, Take 1 tablet by mouth daily.  , Disp: , Rfl:    ondansetron (ZOFRAN) 4 MG tablet, Take 1 tablet (4 mg total) by mouth every 8 (eight) hours as needed for refractory nausea / vomiting., Disp: 20 tablet, Rfl: 0   potassium chloride (KLOR-CON M) 10 MEQ tablet, Take 3 tablets (30 mEq total) by mouth 2 (two) times daily., Disp: 120 tablet, Rfl: 0   rosuvastatin (CRESTOR) 20 MG tablet, Take 20 mg by mouth daily., Disp: , Rfl:    sodium chloride 1 g tablet, Take 1 tablet (1 g total) by mouth 2 (two) times daily with a meal., Disp: 60 tablet, Rfl: 0   traZODone (DESYREL) 100 MG tablet, Take 1 tablet (100 mg total) by mouth at bedtime., Disp: 30 tablet, Rfl: 0   fiber (NUTRISOURCE FIBER) PACK packet, Take 1 packet by mouth 2 (two) times daily. (Patient not taking: Reported on 02/09/2023), Disp: , Rfl:    furosemide (LASIX) 20 MG tablet, Take 1 tablet (20 mg total) by mouth daily. (Patient not taking: Reported on 02/09/2023), Disp: 30 tablet, Rfl: 0   lidocaine (LIDODERM) 5 %, Place 2 patches onto the skin daily. Remove & Discard patch within 12 hours or as directed by MD, Disp: 60 patch, Rfl: 0   oxyCODONE-acetaminophen (PERCOCET/ROXICET) 5-325 MG tablet, Take 0.5 tablets by mouth 4 (four) times daily -  with meals and at bedtime. (Patient not taking: Reported on 02/09/2023), Disp: 60 tablet, Rfl: 0   tamsulosin (FLOMAX) 0.4 MG CAPS capsule, Take 2 capsules (0.8 mg total) by mouth daily after supper. (Patient not taking: Reported on 02/09/2023), Disp: 60 capsule, Rfl: 0   Review of Systems  Constitutional:  Negative for activity change, appetite change, chills, diaphoresis, fatigue, fever and unexpected weight change.  HENT:  Negative for congestion, rhinorrhea, sinus  pressure, sneezing, sore throat and trouble swallowing.   Eyes:  Negative for photophobia and visual disturbance.  Respiratory:  Negative for cough, chest tightness, shortness of breath, wheezing and stridor.   Cardiovascular:  Negative for chest pain, palpitations and leg swelling.  Gastrointestinal:  Negative for abdominal distention, abdominal pain, anal bleeding, blood in stool, constipation, diarrhea, nausea and vomiting.  Genitourinary:  Negative for difficulty urinating, dysuria, flank pain and hematuria.  Musculoskeletal:  Negative for arthralgias, back pain, gait problem, joint swelling and myalgias.  Skin:  Positive for rash and wound. Negative for color change and pallor.  Neurological:  Negative for dizziness, tremors, weakness and light-headedness.  Hematological:  Negative for adenopathy. Does not bruise/bleed easily.  Psychiatric/Behavioral:  Negative for agitation, behavioral problems, confusion, decreased concentration, dysphoric mood and sleep disturbance.        Objective:   Physical Exam Constitutional:      General: She is not in acute distress.    Appearance: Normal appearance. She is well-developed. She is not ill-appearing or diaphoretic.  HENT:     Head: Normocephalic and atraumatic.     Right Ear: Hearing and external ear normal.     Left Ear: Hearing and  external ear normal.     Nose: No nasal deformity or rhinorrhea.  Eyes:     General: No scleral icterus.    Conjunctiva/sclera: Conjunctivae normal.     Right eye: Right conjunctiva is not injected.     Left eye: Left conjunctiva is not injected.     Pupils: Pupils are equal, round, and reactive to light.  Neck:     Vascular: No JVD.  Cardiovascular:     Rate and Rhythm: Normal rate and regular rhythm.     Heart sounds: Normal heart sounds, S1 normal and S2 normal. No murmur heard.    No friction rub.  Abdominal:     General: Bowel sounds are normal. There is no distension.     Palpations: Abdomen is  soft.     Tenderness: There is no abdominal tenderness.  Musculoskeletal:        General: Normal range of motion.     Right shoulder: Normal.     Left shoulder: Normal.     Cervical back: Normal range of motion and neck supple.     Right hip: Normal.     Left hip: Normal.     Right knee: Normal.     Left knee: Normal.  Lymphadenopathy:     Head:     Right side of head: No submandibular, preauricular or posterior auricular adenopathy.     Left side of head: No submandibular, preauricular or posterior auricular adenopathy.     Cervical: No cervical adenopathy.     Right cervical: No superficial or deep cervical adenopathy.    Left cervical: No superficial or deep cervical adenopathy.  Skin:    General: Skin is warm and dry.     Coloration: Skin is not pale.     Findings: No abrasion, bruising, ecchymosis, erythema, lesion or rash.     Nails: There is no clubbing.  Neurological:     Mental Status: She is alert and oriented to person, place, and time.     Sensory: No sensory deficit.     Coordination: Coordination normal.     Gait: Gait normal.  Psychiatric:        Attention and Perception: She is attentive.        Mood and Affect: Mood normal.        Speech: Speech normal.        Behavior: Behavior normal. Behavior is cooperative.        Thought Content: Thought content normal.        Judgment: Judgment normal.    Wound 11/02/2022:     Wound 12/09/2022:    Wound 01/06/2023:       Picture today and was nearly completely closed up  Rash 03/09/2023:    Assessment & Plan:    67 y.o. female with recent vertebral fracture status post hardware placement which was then exposed removed with revision complicated by CSF leak then admitted with fevers found to have postoperative fluid collection multifocal pneumonia thought to be due to aspiration and possible meningitis and ventricultis  The presence of hardware again we would like her to be on protracted antibiotics and we  will try to push into February with the doxycycline and the cefdinir we will check a sed rate CRP CBC with differential and a CMP today.  Rash on arm  Not clear what this is.  See cycle can certainly cause the sun exposure rash that she previously had and can cause a hyperpigmented dictated rash but this does  not seem particularly consistent with either of those.  I will check a CBC as I mentioned CMP and check coagulation factors as well.  Doxycycline is rash: Resolved.  Osteoporosis referral made to endocrinology

## 2023-03-09 NOTE — Therapy (Signed)
OUTPATIENT PHYSICAL THERAPY NEURO TREATMENT   Patient Name: [name] MRN: [id number] DOB:11-29-1955, 67 y.o., female Today's Date: 03/09/2023   PCP: Aliene Beams, MD REFERRING PROVIDER: Fanny Dance, MD  END OF SESSION:  PT End of Session - 03/09/23 1609     Visit Number 8    Number of Visits 17    Date for PT Re-Evaluation 04/08/23    Authorization Type BCBS Medicare    PT Start Time 1615    PT Stop Time 1700    PT Time Calculation (min) 45 min    Activity Tolerance Patient tolerated treatment well    Behavior During Therapy WFL for tasks assessed/performed                 Past Medical History:  Diagnosis Date   Anemia    Anxiety    Arthritis    Bipolar disorder (HCC)    Depression    GERD (gastroesophageal reflux disease)    Heart murmur    "related to VSD"   High cholesterol    History of blood transfusion    "related to OR" (08/19/2016)   History of hiatal hernia    Hypertension    Hyperthyroidism    Mild cognitive impairment 09/06/2018   Paroxysmal ventricular tachycardia (HCC)    Type II diabetes mellitus (HCC)    UTI (urinary tract infection)    being treated with Keflex   Ventricular septal defect    Ventriculitis of brain due to bacteria 11/02/2022   Vertebral fracture, osteoporotic (HCC) 01/06/2023   Past Surgical History:  Procedure Laterality Date   ABDOMINAL HYSTERECTOMY     BACK SURGERY     CARDIAC CATHETERIZATION N/A 06/23/2015   Procedure: Left Heart Cath and Coronary Angiography;  Surgeon: Laurey Morale, MD;  Location: [facility] INVASIVE CV LAB;  Service: Cardiovascular;  Laterality: N/A;   CARDIAC CATHETERIZATION  1960   "VSD was so small; didn't need repaired"   EXAM UNDER ANESTHESIA WITH MANIPULATION OF HIP Right 06/02/2014   dr Jerl Santos   FRACTURE SURGERY     HERNIA REPAIR     HIP CLOSED REDUCTION Right 06/02/2014   Procedure: CLOSED MANIPULATION HIP;  Surgeon: Velna Ochs, MD;  Location: [facility] OR;  Service:  Orthopedics;  Laterality: Right;   JOINT REPLACEMENT     JOINT REPLACEMENT     POSTERIOR LUMBAR FUSION 4 LEVEL N/A 06/26/2022   Procedure: Lumbar One To Lumbar Five Posterior Instrumented Fusion;  Surgeon: Jadene Pierini, MD;  Location: [facility] OR;  Service: Neurosurgery;  Laterality: N/A;   REFRACTIVE SURGERY Bilateral    SHOULDER ARTHROSCOPY Right    SHOULDER OPEN ROTATOR CUFF REPAIR Right    SPINAL FUSION  1996   "t10 down to my coccyx   SPINE HARDWARE REMOVAL     TOTAL ABDOMINAL HYSTERECTOMY     TOTAL HIP ARTHROPLASTY Right 05/10/2014   [email or web address]      by dr Cristal Deer olcott   TOTAL KNEE ARTHROPLASTY Left    TOTAL SHOULDER ARTHROPLASTY Left 08/19/2016   Procedure: TOTAL SHOULDER ARTHROPLASTY;  Surgeon: Jones Broom, MD;  Location: [facility] OR;  Service: Orthopedics;  Laterality: Left;  Left total shoulder replacement   Patient Active Problem List   Diagnosis Date Noted   Nonobstructive atherosclerosis of coronary artery 02/10/2023   Vertebral fracture, osteoporotic (HCC) 01/06/2023   Ventriculitis of brain due to bacteria 11/02/2022   Anemia of chronic illness 10/12/2022   Bipolar disorder, in partial remission, most recent episode depressed (HCC)  10/07/2022   Adjustment disorder with mixed anxiety and depressed mood 09/30/2022   Chronic wound 09/25/2022   Hyperglycemia 09/25/2022   Chronic midline thoracic back pain 09/17/2022   Primary insomnia 09/17/2022   Chronic combined systolic and diastolic CHF (congestive heart failure) (HCC) 09/14/2022   Acute blood loss anemia 09/09/2022   Infection of lumbar spine (HCC) 09/03/2022   Malnutrition of moderate degree 08/20/2022   Hardware complicating wound infection (HCC) 08/20/2022   Infection of deep incisional surgical site after procedure 08/20/2022   Aspiration pneumonia of both lungs (HCC) 08/19/2022   Acute respiratory failure with hypoxia (HCC) 08/19/2022   Sepsis (HCC) 08/19/2022   Seizure (HCC) 08/19/2022   Wound  dehiscence 08/09/2022   Delayed surgical wound healing 07/08/2022   Fracture of lumbar spine without cord injury (HCC) 06/26/2022   Lumbar vertebral fracture (HCC) 06/25/2022   Mood disorder (HCC) 09/29/2021   Mild cognitive impairment 09/06/2018   Hyponatremia 02/28/2018   S/P shoulder replacement, left 08/19/2016   Anxiety 10/17/2014   Acid reflux 10/17/2014   BP (high blood pressure) 10/17/2014   Arthritis, degenerative 10/17/2014   Adult hypothyroidism 10/17/2014   UTI (urinary tract infection) 06/03/2014   Fracture of bone adjacent to prosthesis 06/03/2014   Diabetes (HCC) 06/02/2014   Peri-prosthetic fracture of femur following total hip arthroplasty 06/02/2014   Urinary retention 05/13/2014   Chronic pain 05/10/2014   History of hip surgery 05/10/2014   UNSPECIFIED HEART FAILURE 06/24/2010   UNSPECIFIED CONGENITAL DEFECT OF SEPTAL CLOSURE 06/24/2010   TOBACCO ABUSE 03/18/2010   Secondary cardiomyopathy (HCC) 03/18/2010   DM 06/13/2009   HYPERTENSION, UNSPECIFIED 06/13/2009   VENTRICULAR TACHYCARDIA 06/13/2009   VENTRICULAR SEPTAL DEFECT, CONGENITAL 06/13/2009   Diabetes mellitus, type 2 (HCC) 06/13/2009   Essential (primary) hypertension 06/13/2009    ONSET DATE: 01/30/2023 (MD referral)  REFERRING DIAG: M46.26 (ICD-10-CM) - Infection of lumbar spine (HCC)   THERAPY DIAG:  Muscle weakness (generalized)  Unsteadiness on feet  Other abnormalities of gait and mobility  Rationale for Evaluation and Treatment: Rehabilitation  SUBJECTIVE:                                                                                                                                                                                             SUBJECTIVE STATEMENT: Doing well, no new issues   Pt accompanied by: self  PERTINENT HISTORY: Hx of chronic wound (thoracic spine area) having had a postoperative surgery complicated by wound infection with CSF leak and ventriculitis with  concern for hardware potentially being involved; osteomyelitis; spinal compression fracture (T10)   67 y.o. female with hx of scoliosis  surgery with extensive lower thoracic and lumbar fusion roughly 50yrs ago, she suffered ground level fall in late November, where she fractures through all three columns of L3, thus went to OR on 12/9 for stabilization, new  posterolateral instrumentation fusion on June 26, 2022 complicated by Wound dehiscence with exposed hardware and unstable lumbar spine fracture status post revision of lumbar wound with removal of L1-3 4 and 5 lumbar screws and rod and removal of right L1 screw and revision of L1-L5 posterior instrumented fusion on August 09, 2022 and now concern for postoperative wound infection and ventriculitis. She had complicated protracted course in the icu, but eventually was discharged to CIR on vancomycin and cefepime through 3/14 for ventriculitis and hw complicating wound infection.    Mri of brain on 09/29/22- resolution of ventriculitis.  PAIN:  Are you having pain? Yes: NPRS scale: 6/10 Pain location: lower R flank around fusion site  Pain description: throbbing  Aggravating factors: standing  Relieving factors: sitting  PRECAUTIONS: Back, Fall, and Other: hx of osteoporosis and fractures ; avoid twisting; avoid excess bending; is to log roll for bed mobility; (thoracic) wound being followed by wound care.  RED FLAGS: None   WEIGHT BEARING RESTRICTIONS: No  FALLS: Has patient fallen in last 6 months? Yes. Number of falls 1  LIVING ENVIRONMENT: Lives with: lives with their spouse; has caregiver at home; ramp to enter; one level home Lives in: House/apartment Stairs: Yes: Internal: 1 steps; none; family's homes have steps to get in Has following equipment at home: Dan Humphreys - 2 wheeled, Wheelchair (manual), Shower bench, and bed side commode *has used shoe with lift in the distant past for Leg length discrepancy PLOF: Independent using  cane  PATIENT GOALS: Wants to work on balance, walking, stairs, to get in and out of a car betterto be independent again  OBJECTIVE:    TODAY'S TREATMENT: 03/09/23 Activity Comments  SAQ 3x15  5#  Hooklying hip abd 3x15 Manual resist  Hookyling hip add iso 3x15 ball  LAQ 3x15 5#  Gait training Supervision w/ RW x 255 ft, good stability with need for decreased speed and adjustments during turns due to leg length discrepancy. Education in relevant AD interventions  Seated hamstring curls 3x15 reps Green band       TODAY'S TREATMENT: 03/07/23 Activity Comments  Seated [place] PRE -LAQ 3x10 4# -hamstring curls green band 3x10 -hip flexion, 4# 3 x 10 (assistance with R hip flexion)  Supine hooklying march With assistance, unable to perform against gravity  Supine RLE quad sets 2 x 10 reps, 3" hold  Seated heel digs for hamstring activation EOM  Gait training -RW level surfaces x 50 ft w/ SBA emphasis on turns     Access Code: [email or web address] URL: [email or web address] Date: 03/07/2023 Prepared by: [facility] - Outpatient  Rehab - [facility]  Exercises - Seated Long Arc Quad with Ankle Weight  - 1 x daily - 7 x weekly - 3 sets - 10 reps - Seated Hamstring Curl with Anchored Resistance  - 1 x daily - 7 x weekly - 3 sets - 10 reps - Seated Hip Abduction with Resistance  - 1 x daily - 7 x weekly - 3 sets - 10 reps - Supine Quad Set  - 1 x daily - 7 x weekly - 3 sets - 10 reps   PATIENT EDUCATION: Education details: Continued to highlight importance of HEP, addition of quad sets to HEP Person educated: Patient Education method: Medical illustrator  Education comprehension: verbalized understanding and returned demonstration   -------------------------------------------------------------------------------------------------- Objective measures below taken at initial evaluation:  DIAGNOSTIC FINDINGS: CT lumbar spine 12-06-2022 IMPRESSION: 1. Pronounced chronic  osteopenia. Prior decompression and fusion throughout the lumbar spine.   2. L3 superior endplate/superior body non-acute fracture AND subtle associated horizontal fracture through the chronic posterior element bone mass there (series 9, image 53 today). But no displacement over this series of exams. 3. Difficult to exclude nondisplaced S3/S4 sacral fracture, with mild presacral stranding there.   4. But no other acute osseous abnormality identified. And elsewhere the chronic T12 through sacral ankylosis/arthrodesis appears intact.   5. T10 compression fracture is detailed separately today on CT Thoracic Spine.  COGNITION: Overall cognitive status: Within functional limits for tasks assessed   SENSATION: Light touch: WFL Reports numbness bottom of L foot, sometimes into lateral calf    POSTURE: rounded shoulders, weight shift right, and lateral lean towards R, L shoulder lower than R  LOWER EXTREMITY ROM:   WFL except decreased full Active R hip flexion  LOWER EXTREMITY MMT:    MMT Right Eval Left Eval  Hip flexion 3- 3  Hip extension    Hip abduction    Hip adduction    Hip internal rotation    Hip external rotation    Knee flexion 3+ 3+  Knee extension 3+ 4  Ankle dorsiflexion 3+ 3+  Ankle plantarflexion    Ankle inversion    Ankle eversion    (Blank rows = not tested)   TRANSFERS: Assistive device utilized: Environmental consultant - 2 wheeled  Sit to stand: CGA Stand to sit: CGA  GAIT: Gait pattern: step to pattern, step through pattern, decreased step length- Right, decreased step length- Left, Left hip hike, and knee flexed in stance- Left Distance walked: 35 ft Assistive device utilized: Walker - 2 wheeled Level of assistance: CGA Comments: stops x 1 due to fatigue  FUNCTIONAL TESTS:  10 meter walk test: attempted; unable to complete:  she ambulates 30 ft in 93.6 sec (0.32 ft/sec) Pt stands near walker, no UE support with supervision x 20-30 seconds  TODAY'S  TREATMENT:                                                                                                                              DATE:       PATIENT EDUCATION: Education details: Eval results, POC Person educated: Patient and Child(ren) Education method: Explanation Education comprehension: verbalized understanding  HOME EXERCISE PROGRAM: Access Code: [email or web address] URL: [email or web address] Date: 02/16/2023 Prepared by: Shary Decamp  Exercises - Seated Long Arc Quad with Ankle Weight  - 1 x daily - 7 x weekly - 3 sets - 10 reps - Seated Hamstring Curl with Anchored Resistance  - 1 x daily - 7 x weekly - 3 sets - 10 reps - Seated Hip Abduction with Resistance  - 1 x daily - 7 x weekly - 3 sets - 10 reps  GOALS: Goals reviewed with patient? Yes  SHORT TERM GOALS: Target date: 03/11/2023  Pt will be independent with HEP for improved strength, balance, gait. Baseline: Goal status: IN PROGRESS  2.  Pt will perform 5x sit<>stand test in 5 seconds less than baseline measure, to demo improved functional strength. Baseline: 17 sec w/ BUE push-off Goal status: IN PROGRESS   3.  Gait velocity to improve to 1 ft/sec for improved gait efficiency and safety. Baseline: 0.32 ft/sec Goal status: IN PROGRESS    LONG TERM GOALS: Target date: 04/08/2023  Pt will be independent with HEP for improved strength, balance, gait, decreased hip pain. Baseline:  Goal status: IN PROGRESS  2.  Pt will improve 5x sit<>stand to less than or equal to 12 sec to demonstrate improved functional strength and transfer efficiency. Baseline:  Goal status: IN PROGRESS  3.  Pt will improve Berg score by at least 10 points from baseline to decrease fall risk. Baseline: 33/56 Goal status: IN PROGRESS  4.  Gait velocity to improve to 1.8 ft/sec for improved gait efficiency and safety Baseline:  Goal status: IN PROGRESS  5.  Pt will negotiate at least 3 steps with one handrail and min  guard for improved negotiation into family's homes. Baseline:  Goal status: IN PROGRESS  6.  Pt will ambulate at least 500 ft with least restrictive device for improved community gait. Baseline:  Goal status:IN PROGRESS  ASSESSMENT:  CLINICAL IMPRESSION: Continued with PRE for emphasis on greater endurance to enhance activity tolerance for carryover to ambulation.  Tolerating increased load without adverse effects.  Improved ambulation endurance notable today with single bout of 255 ft w/ RW.  Continued sessions to progress program for increased strength, power, and endurance to enable community-level ambulation and safe stair ambulation.  OBJECTIVE IMPAIRMENTS: Abnormal gait, decreased balance, decreased mobility, difficulty walking, decreased strength, impaired flexibility, postural dysfunction, and pain.   ACTIVITY LIMITATIONS: bending, standing, stairs, transfers, bathing, toileting, dressing, hygiene/grooming, and locomotion level  PARTICIPATION LIMITATIONS: driving, shopping, and community activity  PERSONAL FACTORS: 3+ comorbidities: See PMH  are also affecting patient's functional outcome.   REHAB POTENTIAL: Good  CLINICAL DECISION MAKING: Evolving/moderate complexity  EVALUATION COMPLEXITY: Moderate  PLAN:  PT FREQUENCY: 2x/week  PT DURATION: 8 weeks plus eval week  PLANNED INTERVENTIONS: Therapeutic exercises, Therapeutic activity, Neuromuscular re-education, Balance training, Gait training, Patient/Family education, Self Care, Stair training, Orthotic/Fit training, and DME instructions  PLAN FOR NEXT SESSION: Check STGs.  Work on core stability and strengthening.  Continue to progress HEP for lower extremity strengthening. Likely need to bias for open chain PRE.  Work on sit<>stand from elevated surfaces    5:12 PM, 03/09/23 M. Shary Decamp, PT, DPT Physical Therapist- [place] Office Number: [phone]

## 2023-03-10 LAB — CBC WITH DIFFERENTIAL/PLATELET
Absolute Monocytes: 567 {cells}/uL (ref 200–950)
Basophils Absolute: 93 {cells}/uL (ref 0–200)
Basophils Relative: 0.9 %
Eosinophils Absolute: 309 {cells}/uL (ref 15–500)
Eosinophils Relative: 3 %
HCT: 38.3 % (ref 35.0–45.0)
Hemoglobin: 12.5 g/dL (ref 11.7–15.5)
Lymphs Abs: 2235 {cells}/uL (ref 850–3900)
MCH: 29.2 pg (ref 27.0–33.0)
MCHC: 32.6 g/dL (ref 32.0–36.0)
MCV: 89.5 fL (ref 80.0–100.0)
MPV: 8.6 fL (ref 7.5–12.5)
Monocytes Relative: 5.5 %
Neutro Abs: 7097 {cells}/uL (ref 1500–7800)
Neutrophils Relative %: 68.9 %
Platelets: 578 10*3/uL — ABNORMAL HIGH (ref 140–400)
RBC: 4.28 10*6/uL (ref 3.80–5.10)
RDW: 14.5 % (ref 11.0–15.0)
Total Lymphocyte: 21.7 %
WBC: 10.3 10*3/uL (ref 3.8–10.8)

## 2023-03-10 LAB — COMPLETE METABOLIC PANEL WITH GFR
AG Ratio: 1.4 (calc) (ref 1.0–2.5)
ALT: 20 U/L (ref 6–29)
AST: 22 U/L (ref 10–35)
Albumin: 4.4 g/dL (ref 3.6–5.1)
Alkaline phosphatase (APISO): 97 U/L (ref 37–153)
BUN: 14 mg/dL (ref 7–25)
CO2: 21 mmol/L (ref 20–32)
Calcium: 10.4 mg/dL (ref 8.6–10.4)
Chloride: 102 mmol/L (ref 98–110)
Creat: 0.66 mg/dL (ref 0.50–1.05)
Globulin: 3.1 g/dL (calc) (ref 1.9–3.7)
Glucose, Bld: 135 mg/dL — ABNORMAL HIGH (ref 65–99)
Potassium: 5.4 mmol/L — ABNORMAL HIGH (ref 3.5–5.3)
Sodium: 136 mmol/L (ref 135–146)
Total Bilirubin: 0.3 mg/dL (ref 0.2–1.2)
Total Protein: 7.5 g/dL (ref 6.1–8.1)
eGFR: 95 mL/min/{1.73_m2} (ref >=60)

## 2023-03-10 LAB — SEDIMENTATION RATE: Sed Rate: 38 mm/h — ABNORMAL HIGH (ref 0–30)

## 2023-03-10 LAB — PROTIME-INR
INR: 1.1
Prothrombin Time: 11.4 s (ref 9.0–11.5)

## 2023-03-10 LAB — APTT: aPTT: 33 s — ABNORMAL HIGH (ref 23–32)

## 2023-03-10 LAB — C-REACTIVE PROTEIN: CRP: 5.2 mg/L (ref <8.0)

## 2023-03-11 ENCOUNTER — Ambulatory Visit: Payer: Medicare Other | Admitting: Physical Medicine & Rehabilitation

## 2023-03-14 ENCOUNTER — Ambulatory Visit: Payer: Medicare Other | Admitting: Occupational Therapy

## 2023-03-14 ENCOUNTER — Encounter (HOSPITAL_BASED_OUTPATIENT_CLINIC_OR_DEPARTMENT_OTHER): Payer: Medicare Other | Admitting: Internal Medicine

## 2023-03-14 ENCOUNTER — Encounter: Payer: Self-pay | Admitting: Physical Therapy

## 2023-03-14 ENCOUNTER — Other Ambulatory Visit: Payer: Self-pay

## 2023-03-14 ENCOUNTER — Ambulatory Visit: Payer: Medicare Other | Admitting: Physical Therapy

## 2023-03-14 DIAGNOSIS — M6281 Muscle weakness (generalized): Secondary | ICD-10-CM

## 2023-03-14 DIAGNOSIS — R2681 Unsteadiness on feet: Secondary | ICD-10-CM

## 2023-03-14 DIAGNOSIS — R2689 Other abnormalities of gait and mobility: Secondary | ICD-10-CM

## 2023-03-14 DIAGNOSIS — T8131XA Disruption of external operation (surgical) wound, not elsewhere classified, initial encounter: Secondary | ICD-10-CM

## 2023-03-14 DIAGNOSIS — L98422 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: Secondary | ICD-10-CM | POA: Diagnosis not present

## 2023-03-14 DIAGNOSIS — S32009A Unspecified fracture of unspecified lumbar vertebra, initial encounter for closed fracture: Secondary | ICD-10-CM | POA: Diagnosis not present

## 2023-03-14 DIAGNOSIS — Z09 Encounter for follow-up examination after completed treatment for conditions other than malignant neoplasm: Secondary | ICD-10-CM | POA: Diagnosis not present

## 2023-03-14 DIAGNOSIS — E11622 Type 2 diabetes mellitus with other skin ulcer: Secondary | ICD-10-CM

## 2023-03-14 NOTE — Progress Notes (Signed)
MEDHA, SIROKY ([id number]) [id number] Page 1 of 7 Visit Report for 03/14/2023 Chief Complaint Document Details Patient Name: Date of Service: [name] 03/14/2023 2:15 PM Medical Record Number: [id number] Patient Account Number: [id number] Date of Birth/Sex: Treating RN: May 31, 1956 (67 y.o. F) Primary Care Provider: Aliene Beams Other Clinician: Referring Provider: Treating Provider/Extender: Marguerite Olea in Treatment: 9 Information Obtained from: Patient Chief Complaint 01/07/2023; back wound Electronic Signature(s) Signed: 03/14/2023 4:36:31 PM By: Geralyn Corwin DO Entered By: Geralyn Corwin on 03/14/2023 12:06:49 -------------------------------------------------------------------------------- HPI Details Patient Name: Date of Service: [name] 03/14/2023 2:15 PM Medical Record Number: [id number] Patient Account Number: [id number] Date of Birth/Sex: Treating RN: 1955/12/14 (67 y.o. F) Primary Care Provider: Aliene Beams Other Clinician: Referring Provider: Treating Provider/Extender: Marguerite Olea in Treatment: 9 History of Present Illness HPI Description: 01/07/2023 Ms. Maleaha Obryant is a 67 year old female with a past medical history of controlled type 2 diabetes on oral agents, lumbar fractures and bipolar disorder that presents to the clinic for a 56-month history of nonhealing ulcer to the back. On 06/26/2022 she had L1-L5 fracture stabilization and posterior lateral instrumented fusion secondary to closed unstable fracture of the lumbar spine. This was complicated by wound dehiscence and exposed hardware and she subsequently required revision of the L1-L5 with removal of screws and rods. Post surgery she had complications with CSF leak and developed bacterial meningitis/ventriculitis. She required hospitalization and IV antibiotics and is currently following  with infectious disease and is on cefdinir and doxycycline. She recently had a CT thoracic spine and CT lumbar spine on 12/01/2022 with no evidence of acute osseous abnormalities. No evidence of osteomyelitis. She currently denies signs of systemic infection. She has been using Medihoney and Aquacel Ag to the wound bed. Unfortunately she cannot lay on her sides and lays directly on her back when laying down. 7/8; patient presents for follow-up. She has been using Aquacel Ag with Medihoney to the wound bed. She has no issues or complaints today. Wound is smaller. 8/5; patient presents for follow-up. She has been using Aquacel Ag with Medihoney to the wound bed. She has no issues or complaints today. Wound is much smaller. 8/26; patient presents for follow-up. She has been using Aquacel Ag with Medihoney to the wound bed. Her wound is healed. Electronic Signature(s) Signed: 03/14/2023 4:36:31 PM By: Geralyn Corwin DO Entered By: Geralyn Corwin on 03/14/2023 12:07:17 Ellerson, Gardiner Ramus ([id number]) [id number] Page 2 of 7 -------------------------------------------------------------------------------- Physical Exam Details Patient Name: Date of Service: [name] 03/14/2023 2:15 PM Medical Record Number: [id number] Patient Account Number: [id number] Date of Birth/Sex: Treating RN: 1956-01-16 (67 y.o. F) Primary Care Provider: Aliene Beams Other Clinician: Referring Provider: Treating Provider/Extender: Marguerite Olea in Treatment: 9 Constitutional respirations regular, non-labored and within target range for patient.[name] Psychiatric pleasant and cooperative. Notes T the thoracic region on the posterior back there is epithelization to the previous wound site. o Electronic Signature(s) Signed: 03/14/2023 4:36:31 PM By: Geralyn Corwin DO Entered By: Geralyn Corwin on 03/14/2023  12:07:44 -------------------------------------------------------------------------------- Physician Orders Details Patient Name: Date of Service: [name] 03/14/2023 2:15 PM Medical Record Number: [id number] Patient Account Number: [id number] Date of Birth/Sex: Treating RN: 12-30-1955 (67 y.o. Arta Silence Primary Care Provider: Aliene Beams Other Clinician: Referring Provider: Treating Provider/Extender: Marguerite Olea in Treatment: 9 Verbal / Phone Orders: No Diagnosis Coding ICD-10 Coding Code Description [phone] Non-pressure chronic  ulcer of back with fat layer exposed E11.622 Type 2 diabetes mellitus with other skin ulcer T81.31XA Disruption of external operation (surgical) wound, not elsewhere classified, initial encounter S32.009A Unspecified fracture of unspecified lumbar vertebra, initial encounter for closed fracture Discharge From [facility] Discharge from Wound Care Center - Call if any future wound care needs. can protect x1 one week a bandage for protection. ensure you do not bump or apply prolong pressure to area 2-4 weeks. Electronic Signature(s) Signed: 03/14/2023 4:36:31 PM By: Geralyn Corwin DO Entered By: Geralyn Corwin on 03/14/2023 12:08:12 [name] ([id number]) [id number] Page 3 of 7 -------------------------------------------------------------------------------- Problem List Details Patient Name: Date of Service: [name] 03/14/2023 2:15 PM Medical Record Number: [id number] Patient Account Number: [id number] Date of Birth/Sex: Treating RN: 1955/09/13 (67 y.o. Debara Pickett, Millard.Loa Primary Care Provider: Aliene Beams Other Clinician: Referring Provider: Treating Provider/Extender: Marguerite Olea in Treatment: 9 Active Problems ICD-10 Encounter Code Description Active Date MDM Diagnosis [phone] Non-pressure chronic ulcer of back with  fat layer exposed 01/07/2023 No Yes E11.622 Type 2 diabetes mellitus with other skin ulcer 01/07/2023 No Yes T81.31XA Disruption of external operation (surgical) wound, not elsewhere classified, 01/07/2023 No Yes initial encounter S32.009A Unspecified fracture of unspecified lumbar vertebra, initial encounter for 01/07/2023 No Yes closed fracture Inactive Problems Resolved Problems Electronic Signature(s) Signed: 03/14/2023 4:36:31 PM By: Geralyn Corwin DO Entered By: Geralyn Corwin on 03/14/2023 12:06:33 -------------------------------------------------------------------------------- Progress Note Details Patient Name: Date of Service: [name] 03/14/2023 2:15 PM Medical Record Number: [id number] Patient Account Number: [id number] Date of Birth/Sex: Treating RN: 01-07-1956 (67 y.o. F) Primary Care Provider: Aliene Beams Other Clinician: Referring Provider: Treating Provider/Extender: Marguerite Olea in Treatment: 9 Subjective Chief Complaint Information obtained from Patient 01/07/2023; back wound History of Present Illness (HPI) 01/07/2023 Ms. [name] is a 67 year old female with a past medical history of controlled type 2 diabetes on oral agents, lumbar fractures and bipolar disorder that presents to the clinic for a 71-month history of nonhealing ulcer to the back. On 06/26/2022 she had L1-L5 fracture stabilization and posterior lateral instrumented fusion secondary to closed unstable fracture of the lumbar spine. This was complicated by wound dehiscence and exposed hardware and she subsequently required revision of the L1-L5 with removal of screws and rods. Post surgery she had complications with CSF leak and developed bacterial meningitis/ventriculitis. She required hospitalization and IV antibiotics and is currently following with infectious disease and is on cefdinir and doxycycline. She recently had a CT thoracic spine and CT lumbar  spine on 12/01/2022 with no evidence of acute osseous abnormalities. No evidence of osteomyelitis. She currently denies signs of systemic infection. She has been using Medihoney and Aquacel Ag to the wound bed. Unfortunately she cannot lay on her sides and lays directly on her back when laying down. KATHYRIA, ESSIEN ([id number]) [id number] Page 4 of 7 7/8; patient presents for follow-up. She has been using Aquacel Ag with Medihoney to the wound bed. She has no issues or complaints today. Wound is smaller. 8/5; patient presents for follow-up. She has been using Aquacel Ag with Medihoney to the wound bed. She has no issues or complaints today. Wound is much smaller. 8/26; patient presents for follow-up. She has been using Aquacel Ag with Medihoney to the wound bed. Her wound is healed. Patient History Family History Seizures - Mother, Stroke - Father, No family history of Cancer, Diabetes, Heart Disease, Hereditary Spherocytosis, Hypertension, Kidney Disease, Lung  Disease, Thyroid Problems, Tuberculosis. Social History Former smoker - quit 07/2022, Marital Status - Married, Alcohol Use - Never, Drug Use - No History, Caffeine Use - Never. Medical History Eyes Patient has history of Cataracts Denies history of Glaucoma, Optic Neuritis Ear/Nose/Mouth/Throat Denies history of Chronic sinus problems/congestion, Middle ear problems Hematologic/Lymphatic Patient has history of Anemia Denies history of Hemophilia, Human Immunodeficiency Virus, Lymphedema, Sickle Cell Disease Cardiovascular Patient has history of Hypertension Denies history of Angina, Arrhythmia, Congestive Heart Failure, Coronary Artery Disease, Deep Vein Thrombosis, Hypotension, Myocardial Infarction, Peripheral Arterial Disease, Peripheral Venous Disease, Phlebitis, Vasculitis Gastrointestinal Denies history of Cirrhosis , Colitis, Crohns, Hepatitis A, Hepatitis B, Hepatitis C Endocrine Patient  has history of Type II Diabetes Genitourinary Denies history of End Stage Renal Disease Immunological Denies history of Lupus Erythematosus, Raynauds, Scleroderma Integumentary (Skin) Denies history of History of Burn Musculoskeletal Patient has history of Osteoarthritis Denies history of Gout, Rheumatoid Arthritis, Osteomyelitis Neurologic Denies history of Dementia, Neuropathy, Quadriplegia, Paraplegia, Seizure Disorder Psychiatric Patient has history of Confinement Anxiety Denies history of Anorexia/bulimia Hospitalization/Surgery History - 01/06/2023 osteoporotric vertebral fx. - 06/26/2022 Posterior lumbar fusion 4 level (N/A). - 1996 spinal fusion. - 2015 both hip replacement. - 2018 left shoulder sx. - hysterectomy. Medical A Surgical History Notes nd Constitutional Symptoms (General Health) surgery 06/26/2022 due to a fall and fx fusion- complications hardware exposed, underwent surgery 08/09/2022 revision- surgical site unable to close. Hyperthyroidism Cardiovascular Ventricular septal defect Paroxysmal ventricular tachycardia Gastrointestinal GERD Psychiatric bipolar anxiety depression Objective Constitutional respirations regular, non-labored and within target range for patient.. Vitals Time Taken: 2:21 PM, Height: 61 in, Weight: 139 lbs, BMI: 26.3, Temperature: 98.3 F, Pulse: 93 bpm, Respiratory Rate: 18 breaths/min, Blood Pressure: 149/84 mmHg. Psychiatric pleasant and cooperative. General Notes: T the thoracic region on the posterior back there is epithelization to the previous wound site. o Integumentary (Hair, Skin) Wound #1 status is Healed - Epithelialized. Original cause of wound was Surgical Injury. The date acquired was: 08/11/2022. The wound has been in treatment [address] ([id number]) [id number] Page 5 of 7 weeks. The wound is located on the Back. The wound measures 0cm length x 0cm width x 0cm depth; 0cm^2 area and  0cm^3 volume. There is Fat Layer (Subcutaneous Tissue) exposed. There is no tunneling or undermining noted. There is a medium amount of serosanguineous drainage noted. There is large (67- 100%) red granulation within the wound bed. There is a small (1-33%) amount of necrotic tissue within the wound bed. The periwound skin appearance exhibited: Scarring, Hemosiderin Staining. Periwound temperature was noted as No Abnormality. 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 Her wound is healed. She can keep the area protected daily with a Band-Aid For the next week. Inspect regularly. Follow-up as needed. Plan Discharge From [facility] Services: Discharge from Wound Care Center - Call if any future wound care needs. can protect x1 one week a bandage for protection. ensure you do not bump or apply prolong pressure to area 2-4 weeks. 1. Follow-up as needed 2. Discharge from clinic due to closed wound Electronic Signature(s) Signed: 03/14/2023 4:36:31 PM By: Geralyn Corwin DO Entered By: Geralyn Corwin on 03/14/2023 12:10:51 -------------------------------------------------------------------------------- HxROS Details Patient Name: Date of Service: Marissa TTER, Marissa BETH G.  03/14/2023 2:15 PM Medical Record Number: [id number] Patient Account Number: [id number] Date of Birth/Sex: Treating RN: [birth year] (67 y.o. F) Primary Care Provider: Aliene Beams Other Clinician: Referring Provider: Treating Provider/Extender: Marguerite Olea in Treatment: 9 Constitutional Symptoms (General Health) Medical History: Past Medical History Notes: surgery 06/26/2022  due to a fall and fx fusion- complications hardware exposed, underwent surgery 08/09/2022 revision- surgical site unable to close. Hyperthyroidism Eyes Medical History: Positive for: Cataracts Negative for: Glaucoma; Optic Neuritis Ear/Nose/Mouth/Throat Medical History: Negative for: Chronic sinus problems/congestion; Middle ear problems SHANNIE, BERROA ([id number]) [phone].pdf Page 6 of 7 Hematologic/Lymphatic Medical History: Positive for: Anemia Negative for: Hemophilia; Human Immunodeficiency Virus; Lymphedema; Sickle Cell Disease Cardiovascular Medical History: Positive for: Hypertension Negative for: Angina; Arrhythmia; Congestive Heart Failure; Coronary Artery Disease; Deep Vein Thrombosis; Hypotension; Myocardial Infarction; Peripheral Arterial Disease; Peripheral Venous Disease; Phlebitis; Vasculitis Past Medical History Notes: Ventricular septal defect Paroxysmal ventricular tachycardia Gastrointestinal Medical History: Negative for: Cirrhosis ; Colitis; Crohns; Hepatitis A; Hepatitis B; Hepatitis C Past Medical History Notes: GERD Endocrine Medical History: Positive for: Type II Diabetes Time with diabetes: 10 years Treated with: Oral agents Genitourinary Medical History: Negative for: End Stage Renal Disease Immunological Medical History: Negative for: Lupus Erythematosus; Raynauds; Scleroderma Integumentary (Skin) Medical History: Negative for: History of Burn Musculoskeletal Medical History: Positive for: Osteoarthritis Negative for: Gout; Rheumatoid Arthritis; Osteomyelitis Neurologic Medical History: Negative for: Dementia; Neuropathy; Quadriplegia; Paraplegia; Seizure Disorder Psychiatric Medical History: Positive for: Confinement Anxiety Negative for: Anorexia/bulimia Past Medical History Notes: bipolar anxiety depression HBO Extended History Items Eyes: Cataracts Immunizations Pneumococcal Vaccine: Received  Pneumococcal Vaccination: Yes Received Pneumococcal Vaccination On or After 60th Birthday: Yes Implantable [address] [name], [name] ([id number]) [id number] Page 7 of 7 Hospitalization / Surgery History Type of Hospitalization/Surgery 01/06/2023 osteoporotric vertebral fx 06/26/2022 Posterior lumbar fusion 4 level (N/A) 1996 spinal fusion 2015 both hip replacement 2018 left shoulder sx hysterectomy Family and Social History Cancer: No; Diabetes: No; Heart Disease: No; Hereditary Spherocytosis: No; Hypertension: No; Kidney Disease: No; Lung Disease: No; Seizures: Yes - Mother; Stroke: Yes - Father; Thyroid Problems: No; Tuberculosis: No; Former smoker - quit 07/2022; Marital Status - Married; Alcohol Use: Never; Drug Use: No History; Caffeine Use: Never; Financial Concerns: No; Food, Clothing or Shelter Needs: No; Support System Lacking: No; Transportation Concerns: No Electronic Signature(s) Signed: 03/14/2023 4:36:31 PM By: Geralyn Corwin DO Entered By: Geralyn Corwin on 03/14/2023 12:07:24 -------------------------------------------------------------------------------- SuperBill Details Patient Name: Date of Service: [name] 03/14/2023 Medical Record Number: [id number] Patient Account Number: [id number] Date of Birth/Sex: Treating RN: 1955/08/14 (67 y.o. 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 E11.622 Type 2 diabetes mellitus with other skin ulcer T81.31XA Disruption of external operation (surgical) wound, not elsewhere classified, initial encounter S32.009A Unspecified fracture of unspecified lumbar vertebra, initial encounter for closed fracture Facility Procedures : CPT4 Code:  [id number] Description: 99213 - WOUND CARE VISIT-LEV 3 EST PT Modifier: Quantity: 1 Physician Procedures : CPT4 Code Description Modifier [id number] - WC PHYS LEVEL 3 - EST PT ICD-10 Diagnosis Description L98.422 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

## 2023-03-14 NOTE — Therapy (Signed)
OUTPATIENT PHYSICAL THERAPY NEURO TREATMENT/PROGRESS NOTE   Patient Name: [name] MRN: [id number] DOB:[birth year], 67 y.o., female Today's Date: 03/14/2023   PCP: Aliene Beams, MD REFERRING PROVIDER: Fanny Dance, MD  Progress Note Reporting Period 02/09/2023 to 03/14/2023  See note below for Objective Data and Assessment of Progress/Goals.     END OF SESSION:  PT End of Session - 03/14/23 1229     Visit Number 9    Number of Visits 17    Date for PT Re-Evaluation 04/08/23    Authorization Type [employer] Medicare    Progress Note Due on Visit --   (PN completed at visit 9)   PT Start Time 1240   finishes late with OT; then uses restroom   PT Stop Time 1315    PT Time Calculation (min) 35 min    Activity Tolerance Patient tolerated treatment well    Behavior During Therapy WFL for tasks assessed/performed                  Past Medical History:  Diagnosis Date   Anemia    Anxiety    Arthritis    Bipolar disorder (HCC)    Depression    GERD (gastroesophageal reflux disease)    Heart murmur    "related to VSD"   High cholesterol    History of blood transfusion    "related to OR" (08/19/2016)   History of hiatal hernia    Hypertension    Hyperthyroidism    Mild cognitive impairment 09/06/2018   Paroxysmal ventricular tachycardia (HCC)    Type II diabetes mellitus (HCC)    UTI (urinary tract infection)    being treated with Keflex   Ventricular septal defect    Ventriculitis of brain due to bacteria 11/02/2022   Vertebral fracture, osteoporotic (HCC) 01/06/2023   Past Surgical History:  Procedure Laterality Date   ABDOMINAL HYSTERECTOMY     BACK SURGERY     CARDIAC CATHETERIZATION N/A 06/23/2015   Procedure: Left Heart Cath and Coronary Angiography;  Surgeon: Laurey Morale, MD;  Location: [facility] INVASIVE CV LAB;  Service: Cardiovascular;  Laterality: N/A;   CARDIAC CATHETERIZATION  1960   "VSD was so small; didn't need repaired"   EXAM UNDER  ANESTHESIA WITH MANIPULATION OF HIP Right 06/02/2014   dr Jerl Santos   FRACTURE SURGERY     HERNIA REPAIR     HIP CLOSED REDUCTION Right 06/02/2014   Procedure: CLOSED MANIPULATION HIP;  Surgeon: Velna Ochs, MD;  Location: [facility] OR;  Service: Orthopedics;  Laterality: Right;   JOINT REPLACEMENT     JOINT REPLACEMENT     POSTERIOR LUMBAR FUSION 4 LEVEL N/A 06/26/2022   Procedure: Lumbar One To Lumbar Five Posterior Instrumented Fusion;  Surgeon: Jadene Pierini, MD;  Location: [facility] OR;  Service: Neurosurgery;  Laterality: N/A;   REFRACTIVE SURGERY Bilateral    SHOULDER ARTHROSCOPY Right    SHOULDER OPEN ROTATOR CUFF REPAIR Right    SPINAL FUSION  1996   "t10 down to my coccyx   SPINE HARDWARE REMOVAL     TOTAL ABDOMINAL HYSTERECTOMY     TOTAL HIP ARTHROPLASTY Right 05/10/2014   [place]      by dr Cristal Deer olcott   TOTAL KNEE ARTHROPLASTY Left    TOTAL SHOULDER ARTHROPLASTY Left 08/19/2016   Procedure: TOTAL SHOULDER ARTHROPLASTY;  Surgeon: Jones Broom, MD;  Location: [facility] OR;  Service: Orthopedics;  Laterality: Left;  Left total shoulder replacement   Patient Active Problem List  Diagnosis Date Noted   Nonobstructive atherosclerosis of coronary artery 02/10/2023   Vertebral fracture, osteoporotic (HCC) 01/06/2023   Ventriculitis of brain due to bacteria 11/02/2022   Anemia of chronic illness 10/12/2022   Bipolar disorder, in partial remission, most recent episode depressed (HCC) 10/07/2022   Adjustment disorder with mixed anxiety and depressed mood 09/30/2022   Chronic wound 09/25/2022   Hyperglycemia 09/25/2022   Chronic midline thoracic back pain 09/17/2022   Primary insomnia 09/17/2022   Chronic combined systolic and diastolic CHF (congestive heart failure) (HCC) 09/14/2022   Acute blood loss anemia 09/09/2022   Infection of lumbar spine (HCC) 09/03/2022   Malnutrition of moderate degree 08/20/2022   Hardware complicating wound infection (HCC) 08/20/2022    Infection of deep incisional surgical site after procedure 08/20/2022   Aspiration pneumonia of both lungs (HCC) 08/19/2022   Acute respiratory failure with hypoxia (HCC) 08/19/2022   Sepsis (HCC) 08/19/2022   Seizure (HCC) 08/19/2022   Wound dehiscence 08/09/2022   Delayed surgical wound healing 07/08/2022   Fracture of lumbar spine without cord injury (HCC) 06/26/2022   Lumbar vertebral fracture (HCC) 06/25/2022   Mood disorder (HCC) 09/29/2021   Mild cognitive impairment 09/06/2018   Hyponatremia 02/28/2018   S/P shoulder replacement, left 08/19/2016   Anxiety 10/17/2014   Acid reflux 10/17/2014   BP (high blood pressure) 10/17/2014   Arthritis, degenerative 10/17/2014   Adult hypothyroidism 10/17/2014   UTI (urinary tract infection) 06/03/2014   Fracture of bone adjacent to prosthesis 06/03/2014   Diabetes (HCC) 06/02/2014   Peri-prosthetic fracture of femur following total hip arthroplasty 06/02/2014   Urinary retention 05/13/2014   Chronic pain 05/10/2014   History of hip surgery 05/10/2014   UNSPECIFIED HEART FAILURE 06/24/2010   UNSPECIFIED CONGENITAL DEFECT OF SEPTAL CLOSURE 06/24/2010   TOBACCO ABUSE 03/18/2010   Secondary cardiomyopathy (HCC) 03/18/2010   DM 06/13/2009   HYPERTENSION, UNSPECIFIED 06/13/2009   VENTRICULAR TACHYCARDIA 06/13/2009   VENTRICULAR SEPTAL DEFECT, CONGENITAL 06/13/2009   Diabetes mellitus, type 2 (HCC) 06/13/2009   Essential (primary) hypertension 06/13/2009    ONSET DATE: 01/30/2023 (MD referral)  REFERRING DIAG: M46.26 (ICD-10-CM) - Infection of lumbar spine (HCC)   THERAPY DIAG:  Unsteadiness on feet  Other abnormalities of gait and mobility  Muscle weakness (generalized)  Rationale for Evaluation and Treatment: Rehabilitation  SUBJECTIVE:                                                                                                                                                                                              SUBJECTIVE STATEMENT: Did a lot of walking the other day.  Reports  maybe back to 50% of her baseline (used a cane prior to November 2023).   Pt accompanied by: self  PERTINENT HISTORY: Hx of chronic wound (thoracic spine area) having had a postoperative surgery complicated by wound infection with CSF leak and ventriculitis with concern for hardware potentially being involved; osteomyelitis; spinal compression fracture (T10)   67 y.o. female with hx of scoliosis surgery with extensive lower thoracic and lumbar fusion roughly 42yrs ago, she suffered ground level fall in late November, where she fractures through all three columns of L3, thus went to OR on 12/9 for stabilization, new  posterolateral instrumentation fusion on June 26, 2022 complicated by Wound dehiscence with exposed hardware and unstable lumbar spine fracture status post revision of lumbar wound with removal of L1-3 4 and 5 lumbar screws and rod and removal of right L1 screw and revision of L1-L5 posterior instrumented fusion on August 09, 2022 and now concern for postoperative wound infection and ventriculitis. She had complicated protracted course in the icu, but eventually was discharged to CIR on vancomycin and cefepime through 3/14 for ventriculitis and hw complicating wound infection.    Mri of brain on 09/29/22- resolution of ventriculitis.  PAIN:  Are you having pain? Yes: NPRS scale: 5/10 Pain location: lower R flank around fusion site  Pain description: throbbing  Aggravating factors: standing  Relieving factors: sitting  PRECAUTIONS: Back, Fall, and Other: hx of osteoporosis and fractures ; avoid twisting; avoid excess bending; is to log roll for bed mobility; (thoracic) wound being followed by wound care.  RED FLAGS: None   WEIGHT BEARING RESTRICTIONS: No  FALLS: Has patient fallen in last 6 months? Yes. Number of falls 1  LIVING ENVIRONMENT: Lives with: lives with their spouse; has caregiver at home; ramp  to enter; one level home Lives in: House/apartment Stairs: Yes: Internal: 1 steps; none; family's homes have steps to get in Has following equipment at home: Dan Humphreys - 2 wheeled, Wheelchair (manual), Shower bench, and bed side commode *has used shoe with lift in the distant past for Leg length discrepancy PLOF: Independent using cane  PATIENT GOALS: Wants to work on balance, walking, stairs, to get in and out of a car betterto be independent again  OBJECTIVE:    TODAY'S TREATMENT: 03/14/2023 Activity Comments  Gait velocity:  21.15 sec in 32.8 ft (1.55 ft/sec) Improved from 0.3 ft/sec  5x sit<>Stand from w/c:  11.65 sec pushing up from chair (and keeps hands on chair armrests) 5x sit<>stand from 18" chair: 16.71 sec   Seated core stability: Alt UE lifts 2 x 5 Open shoulder posture (scapular retraction) 2 x 5 reps BUE lifts with 2.2# ball   Standing balance exercises: EO 30 sec EC 15-25 sec, 3 reps EO 3 x 5 reps head turns/head nods Pt with increased RLE weightshift due to leg length discrepancy; reports fatigue, burning in R hip with static standing  Gait 50 ft x 2 reps, RW min guard, supervision           TODAY'S TREATMENT: 03/09/23 Activity Comments  SAQ 3x15  5#  Hooklying hip abd 3x15 Manual resist  Hookyling hip add iso 3x15 ball  LAQ 3x15 5#  Gait training Supervision w/ RW x 255 ft, good stability with need for decreased speed and adjustments during turns due to leg length discrepancy. Education in relevant AD interventions  Seated hamstring curls 3x15 reps Green band          Access Code: [email or web address] URL: [email or web address] Date: 03/07/2023 Prepared  by: [facility] - [facility]  Exercises - Seated Long Arc Quad with Ankle Weight  - 1 x daily - 7 x weekly - 3 sets - 10 reps - Seated Hamstring Curl with Anchored Resistance  - 1 x daily - 7 x weekly - 3 sets - 10 reps - Seated Hip Abduction with Resistance  - 1 x daily - 7 x  weekly - 3 sets - 10 reps - Supine Quad Set  - 1 x daily - 7 x weekly - 3 sets - 10 reps   PATIENT EDUCATION: Education details: Progress towards goals Person educated: Patient Education method: Medical illustrator Education comprehension: verbalized understanding and returned demonstration   -------------------------------------------------------------------------------------------------- Objective measures below taken at initial evaluation:  DIAGNOSTIC FINDINGS: CT lumbar spine 04-Dec-2022 IMPRESSION: 1. Pronounced chronic osteopenia. Prior decompression and fusion throughout the lumbar spine.   2. L3 superior endplate/superior body non-acute fracture AND subtle associated horizontal fracture through the chronic posterior element bone mass there (series 9, image 53 today). But no displacement over this series of exams. 3. Difficult to exclude nondisplaced S3/S4 sacral fracture, with mild presacral stranding there.   4. But no other acute osseous abnormality identified. And elsewhere the chronic T12 through sacral ankylosis/arthrodesis appears intact.   5. T10 compression fracture is detailed separately today on CT Thoracic Spine.  COGNITION: Overall cognitive status: Within functional limits for tasks assessed   SENSATION: Light touch: WFL Reports numbness bottom of L foot, sometimes into lateral calf    POSTURE: rounded shoulders, weight shift right, and lateral lean towards R, L shoulder lower than R  LOWER EXTREMITY ROM:   WFL except decreased full Active R hip flexion  LOWER EXTREMITY MMT:    MMT Right Eval Left Eval  Hip flexion 3- 3  Hip extension    Hip abduction    Hip adduction    Hip internal rotation    Hip external rotation    Knee flexion 3+ 3+  Knee extension 3+ 4  Ankle dorsiflexion 3+ 3+  Ankle plantarflexion    Ankle inversion    Ankle eversion    (Blank rows = not tested)   TRANSFERS: Assistive device utilized: Environmental consultant - 2  wheeled  Sit to stand: CGA Stand to sit: CGA  GAIT: Gait pattern: step to pattern, step through pattern, decreased step length- Right, decreased step length- Left, Left hip hike, and knee flexed in stance- Left Distance walked: 35 ft Assistive device utilized: Walker - 2 wheeled Level of assistance: CGA Comments: stops x 1 due to fatigue  FUNCTIONAL TESTS:  10 meter walk test: attempted; unable to complete:  she ambulates 30 ft in 93.6 sec (0.32 ft/sec) Pt stands near walker, no UE support with supervision x 20-30 seconds  TODAY'S TREATMENT:  DATE:       PATIENT EDUCATION: Education details: Eval results, POC Person educated: Patient and Child(ren) Education method: Explanation Education comprehension: verbalized understanding  HOME EXERCISE PROGRAM: Access Code: [email or web address] URL: [email or web address] Date: 02/16/2023 Prepared by: Shary Decamp  Exercises - Seated Long Arc Quad with Ankle Weight  - 1 x daily - 7 x weekly - 3 sets - 10 reps - Seated Hamstring Curl with Anchored Resistance  - 1 x daily - 7 x weekly - 3 sets - 10 reps - Seated Hip Abduction with Resistance  - 1 x daily - 7 x weekly - 3 sets - 10 reps  GOALS: Goals reviewed with patient? Yes  SHORT TERM GOALS: Target date: 03/11/2023  Pt will be independent with HEP for improved strength, balance, gait. Baseline: Goal status: MET, per report 03/14/2023  2.  Pt will perform 5x sit<>stand test in 5 seconds less than baseline measure, to demo improved functional strength. Baseline: 17 sec w/ BUE push-off>16.71 sec with UE support from chair Goal status: NOT YET MET, PROGRESSING  3.  Gait velocity to improve to 1 ft/sec for improved gait efficiency and safety. Baseline: 0.32 ft/sec>1.55 ft/sec with RW Goal status: MET 03/14/2023    LONG TERM GOALS: Target date:  04/08/2023  Pt will be independent with HEP for improved strength, balance, gait, decreased hip pain. Baseline:  Goal status: IN PROGRESS  2.  Pt will improve 5x sit<>stand to less than or equal to 12 sec to demonstrate improved functional strength and transfer efficiency. Baseline:  Goal status: IN PROGRESS  3.  Pt will improve Berg score by at least 10 points from baseline to decrease fall risk. Baseline: 33/56 Goal status: IN PROGRESS  4.  Gait velocity to improve to 1.8 ft/sec for improved gait efficiency and safety Baseline:  Goal status: IN PROGRESS  5.  Pt will negotiate at least 3 steps with one handrail and min guard for improved negotiation into family's homes. Baseline:  Goal status: IN PROGRESS  6.  Pt will ambulate at least 500 ft with least restrictive device for improved community gait. Baseline:  Goal status:IN PROGRESS  ASSESSMENT:  CLINICAL IMPRESSION: Assessed STGs this visit, with PROGRESS NOTE completed and sent to MD today.  Pt reports noting improvement, about 50% towards her baseline.  She continues to report some low back pain.  She has met STG 1 and 3, with definite improvement in gait velocity since initial evaluation (0.3 ft/sec>1.55 ft/sec with RW).  She is improving 5x sit<>stand score, just not yet to goal level.  Began work on standing balance today, with pt having majority of weight on RLE in static standing, due to leg length difference.  She continues to benefit from skilled PT towards LTGs, as she is at fall risk per 5x sit<>stand and Berg Balance test (at eval).  Plan to continue per POC towards LTGs, with focus on strength, balance, and gait training for improved functional mobility and decreased fall risk.    OBJECTIVE IMPAIRMENTS: Abnormal gait, decreased balance, decreased mobility, difficulty walking, decreased strength, impaired flexibility, postural dysfunction, and pain.   ACTIVITY LIMITATIONS: bending, standing, stairs, transfers, bathing,  toileting, dressing, hygiene/grooming, and locomotion level  PARTICIPATION LIMITATIONS: driving, shopping, and community activity  PERSONAL FACTORS: 3+ comorbidities: See PMH  are also affecting patient's functional outcome.   REHAB POTENTIAL: Good  CLINICAL DECISION MAKING: Evolving/moderate complexity  EVALUATION COMPLEXITY: Moderate  PLAN:  PT FREQUENCY: 2x/week  PT DURATION: 8 weeks plus eval week  PLANNED  INTERVENTIONS: Therapeutic exercises, Therapeutic activity, Neuromuscular re-education, Balance training, Gait training, Patient/Family education, Self Care, Stair training, Orthotic/Fit training, and DME instructions  PLAN FOR NEXT SESSION: Continue work on core stability and strengthening.  Continue to progress HEP for lower extremity strengthening. Likely need to bias for open chain PRE.  Consider standing weightshifting activities to increase LLE weightshift in standing    Lonia Blood, PT 03/14/23 5:15 PM Phone: [phone] Fax: [phone]  [facility] at [facility] [address] [place], [place] [address] Phone # [phone] Fax # [phone]

## 2023-03-14 NOTE — Therapy (Signed)
OUTPATIENT OCCUPATIONAL THERAPY NEURO EVALUATION  Patient Name: [name] MRN: [id number] DOB:[birth year], 67 y.o., female Today's Date: 03/15/2023  PCP: Aliene Beams, MD REFERRING PROVIDER: Fanny Dance, MD  END OF SESSION:  OT End of Session - 03/14/23 1854     Visit Number 1    Number of Visits 17    Date for OT Re-Evaluation 05/13/23    Authorization Type [employer] Medicare    OT Start Time 1150    OT Stop Time 1230    OT Time Calculation (min) 40 min             Past Medical History:  Diagnosis Date   Anemia    Anxiety    Arthritis    Bipolar disorder (HCC)    Depression    GERD (gastroesophageal reflux disease)    Heart murmur    "related to VSD"   High cholesterol    History of blood transfusion    "related to OR" (08/19/2016)   History of hiatal hernia    Hypertension    Hyperthyroidism    Mild cognitive impairment 09/06/2018   Paroxysmal ventricular tachycardia (HCC)    Type II diabetes mellitus (HCC)    UTI (urinary tract infection)    being treated with Keflex   Ventricular septal defect    Ventriculitis of brain due to bacteria 11/02/2022   Vertebral fracture, osteoporotic (HCC) 01/06/2023   Past Surgical History:  Procedure Laterality Date   ABDOMINAL HYSTERECTOMY     BACK SURGERY     CARDIAC CATHETERIZATION N/A 06/23/2015   Procedure: Left Heart Cath and Coronary Angiography;  Surgeon: Laurey Morale, MD;  Location: [facility] INVASIVE CV LAB;  Service: Cardiovascular;  Laterality: N/A;   CARDIAC CATHETERIZATION  1960   "VSD was so small; didn't need repaired"   EXAM UNDER ANESTHESIA WITH MANIPULATION OF HIP Right 06/02/2014   dr Jerl Santos   FRACTURE SURGERY     HERNIA REPAIR     HIP CLOSED REDUCTION Right 06/02/2014   Procedure: CLOSED MANIPULATION HIP;  Surgeon: Velna Ochs, MD;  Location: [facility] OR;  Service: Orthopedics;  Laterality: Right;   JOINT REPLACEMENT     JOINT REPLACEMENT     POSTERIOR LUMBAR FUSION 4 LEVEL N/A  06/26/2022   Procedure: Lumbar One To Lumbar Five Posterior Instrumented Fusion;  Surgeon: Jadene Pierini, MD;  Location: [facility] OR;  Service: Neurosurgery;  Laterality: N/A;   REFRACTIVE SURGERY Bilateral    SHOULDER ARTHROSCOPY Right    SHOULDER OPEN ROTATOR CUFF REPAIR Right    SPINAL FUSION  1996   "t10 down to my coccyx   SPINE HARDWARE REMOVAL     TOTAL ABDOMINAL HYSTERECTOMY     TOTAL HIP ARTHROPLASTY Right 05/10/2014   [email or web address]      by dr Cristal Deer olcott   TOTAL KNEE ARTHROPLASTY Left    TOTAL SHOULDER ARTHROPLASTY Left 08/19/2016   Procedure: TOTAL SHOULDER ARTHROPLASTY;  Surgeon: Jones Broom, MD;  Location: [facility] OR;  Service: Orthopedics;  Laterality: Left;  Left total shoulder replacement   Patient Active Problem List   Diagnosis Date Noted   Nonobstructive atherosclerosis of coronary artery 02/10/2023   Vertebral fracture, osteoporotic (HCC) 01/06/2023   Ventriculitis of brain due to bacteria 11/02/2022   Anemia of chronic illness 10/12/2022   Bipolar disorder, in partial remission, most recent episode depressed (HCC) 10/07/2022   Adjustment disorder with mixed anxiety and depressed mood 09/30/2022   Chronic wound 09/25/2022   Hyperglycemia 09/25/2022   Chronic midline  thoracic back pain 09/17/2022   Primary insomnia 09/17/2022   Chronic combined systolic and diastolic CHF (congestive heart failure) (HCC) 09/14/2022   Acute blood loss anemia 09/09/2022   Infection of lumbar spine (HCC) 09/03/2022   Malnutrition of moderate degree 08/20/2022   Hardware complicating wound infection (HCC) 08/20/2022   Infection of deep incisional surgical site after procedure 08/20/2022   Aspiration pneumonia of both lungs (HCC) 08/19/2022   Acute respiratory failure with hypoxia (HCC) 08/19/2022   Sepsis (HCC) 08/19/2022   Seizure (HCC) 08/19/2022   Wound dehiscence 08/09/2022   Delayed surgical wound healing 07/08/2022   Fracture of lumbar spine without cord injury (HCC)  06/26/2022   Lumbar vertebral fracture (HCC) 06/25/2022   Mood disorder (HCC) 09/29/2021   Mild cognitive impairment 09/06/2018   Hyponatremia 02/28/2018   S/P shoulder replacement, left 08/19/2016   Anxiety 10/17/2014   Acid reflux 10/17/2014   BP (high blood pressure) 10/17/2014   Arthritis, degenerative 10/17/2014   Adult hypothyroidism 10/17/2014   UTI (urinary tract infection) 06/03/2014   Fracture of bone adjacent to prosthesis 06/03/2014   Diabetes (HCC) 06/02/2014   Peri-prosthetic fracture of femur following total hip arthroplasty 06/02/2014   Urinary retention 05/13/2014   Chronic pain 05/10/2014   History of hip surgery 05/10/2014   UNSPECIFIED HEART FAILURE 06/24/2010   UNSPECIFIED CONGENITAL DEFECT OF SEPTAL CLOSURE 06/24/2010   TOBACCO ABUSE 03/18/2010   Secondary cardiomyopathy (HCC) 03/18/2010   DM 06/13/2009   HYPERTENSION, UNSPECIFIED 06/13/2009   VENTRICULAR TACHYCARDIA 06/13/2009   VENTRICULAR SEPTAL DEFECT, CONGENITAL 06/13/2009   Diabetes mellitus, type 2 (HCC) 06/13/2009   Essential (primary) hypertension 06/13/2009    ONSET DATE: 08/09/22 (referral 02/24/23)  REFERRING DIAG: M62.81 (ICD-10-CM) - Muscle weakness (generalized) MD requesting plan to address upper body strenghtening and full ADL participation  THERAPY DIAG:  Muscle weakness (generalized)  Unsteadiness on feet  Other abnormalities of gait and mobility  Rationale for Evaluation and Treatment: Rehabilitation  SUBJECTIVE:   SUBJECTIVE STATEMENT: Pt reports breaking her back and then underwent a fusion that got infected with bacterial meningitis, "oste something", and became septic.  Pt reports that they took her off of her psych medications and she had a seizure that was witnessed by her adult children.  Pt reports leaving the hospital with 6 new doctors, so now has therapy appts, medical appts, and a daily caretaker. Pt reports difficulty with dynamic balance without UE support, dropping  items, and decreased grip strength. Pt accompanied by: self and PCA (who dropped her off at appt)  PERTINENT HISTORY: 67 year old female with history of PVT, T2DM, small membranous VSD, depression, bipolar d/o, mild cognitive impairment,, fall 05/2022 with 3 column unstable fracture through prior scoliosis construct s/p instrumentation with exposed hardware and wound dehiscence. She was admitted on 08/09/2022 for revision of complex lumbar wound, removal of L1-L5 lumbar screws and rods with revision L1-L5 posterior fusion by Dr. Johnsie Cancel.    Postop had issues with fatigue cough, hypotension as well as confusion overnight on 01/24. BLE Dopplers ordered for follow-up on 01/22 and was negative for DVT and showed hypoechoic collection in left popliteal fossa.  Foley placed due to ongoing issues with urinary retention.  Due to ongoing confusion and lethargy, antibiotics added due to concerns of UTI and oxycodone dose decreased due to concerns of side effects.  She started developing drainage from her wound on 01/28 as well as watery diarrhea and stool was negative for C. difficile. [name] consulted due to worsening of  respiratory status due to sepsis from acute meningitis and asked patient pneumonia and she required intubation on 02/01.  Neurology consulted due to concerns of seizures and EEG showed moderate diffuse encephalopathy.  MRI brain done showing punctate foci of acute ischemia right frontal lobe and diffusion abnormality within the occipital horn of left lateral ventricle with proteinaceous/purulent debris.  MRI spine showed L2-L5 fusion widely patent with decrease in bone marrow edema L3.  PRECAUTIONS: Back and Fall (no twisting, minimal bending)  and Other: hx of osteoporosis and fractures ; avoid twisting; avoid excess bending; is to log roll for bed mobility; (thoracic) wound being followed by wound care.   WEIGHT BEARING RESTRICTIONS: No  PAIN:  Are you having pain? Yes: NPRS scale: 5/10 Pain  location: lower back, around fusion site Pain description: aching Aggravating factors: certain movements Relieving factors: flexeril  FALLS: Has patient fallen in last 6 months? Yes. Number of falls 1, fell the day after she got home.    LIVING ENVIRONMENT: Lives with: lives with their spouse, PCA 8:30- 4 Mon- Fri Lives in: House/apartment Stairs: Yes: External: 1 steps; caregiver/family bumping pt up the platform step Has following equipment at home: Dan Humphreys - 2 wheeled, Environmental consultant - 4 wheeled, Wheelchair (manual), Shower bench, bed side commode, and Grab bars  PLOF: Independent and Independent with basic ADLs  PATIENT GOALS: "I want to be able to [place]"  OBJECTIVE:   HAND DOMINANCE: Right  ADLs: Transfers/ambulation related to ADLs: CGA - Supervision with RW, stand pivot transfers with and without RW with close supervision Grooming: Mod I, washing face standing and then sitting to put on makeup UB Dressing: Setup/supervision LB Dressing: needs assist with shoes and pants with RLE Toileting: BSC with handles, Supervision for clothing management and hygiene at sit > stand level Bathing: uses walk-in shower in master bathroom, bathing at sit > stand level Tub Shower transfers: close supervision, stabilizing RW when stepping over walk-in shower ledge Equipment: Grab bars, Walk in shower, bed side commode, and built in shower seat, walk-in shower  IADLs: Meal Prep: has cooked a chicken dish and baked spaghetti, standing to retreive items but then sitting for majority of meal prep.  Does have concerns with retrieving items from upper and lower cabinets, refrigerator.  Difficulty with opening jars/containers Medication management: daughter fills up pill box and pt/husband/caregivers assisting as needed  MOBILITY STATUS: Needs Assist: min guard - supervision ambulatory transfers, Supervision - Mod I stand pivot from w/c  POSTURE COMMENTS:  rounded  shoulders, weight shift right, and lateral lean towards R, L shoulder lower than R   ACTIVITY TOLERANCE: Activity tolerance: WFL for tasks assessed on eval  UPPER EXTREMITY ROM:  shoulder flexion with compensatory abduction but able to reach [facility], pt also with full ROM with internal/external rotation  UPPER EXTREMITY MMT:   grossly 3+ to 4/5 bilaterally   HAND FUNCTION: Grip strength: Right: 22 lbs; Left: 30 lbs and Tip pinch: Right 8 lbs, Left: 7 lbs  COORDINATION: 9 Hole Peg test: Right: 21.97 sec; Left: 25.69 sec  SENSATION: R foot is still numb on the bottom, BUE sensation intact  COGNITION: Overall cognitive status: Within functional limits for tasks assessed   TODAY'S TREATMENT:  DATE:  03/14/23 Eval only  PATIENT EDUCATION: Education details: Educated on role and purpose of OT as well as potential interventions and goals for therapy based on initial evaluation findings. Person educated: Patient Education method: Explanation Education comprehension: verbalized understanding and needs further education  HOME EXERCISE PROGRAM: TBD   GOALS: Goals reviewed with patient? Yes  SHORT TERM GOALS: Target date: 04/15/23  Pt will be independent in BUE strengthening HEP. Baseline: Goal status: INITIAL  2.  Pt will verbalize understanding of task modifications and/or potential AE needs to increase ease, safety, and independence w/ ADLs. Baseline:  Goal status: INITIAL  3.  Pt will demonstrate improved grip strength by 4# with RUE to increase success with opening food containers. Baseline: R:22# and L: 30# Goal status: INITIAL    LONG TERM GOALS: Target date: 05/13/23  Pt will be able to complete LB dressing at Mod I level utilizing compensatory strategies and/or AE as needed. Baseline:  Goal status: INITIAL  2.  Pt will demonstrate improved ease with stand pivot and short  distance ambulatory transfers to access bathroom toilet and in/out of car at Mod I level. Baseline:  Goal status: INITIAL  3.  Pt will verbalize and/or demonstrate safe techniques to retreive items from floor when dropped. Baseline:  Goal status: INITIAL  4.  Pt will demonstrate improved balance and endurance to maintain dynamic standing balance for 10 mins to complete ADLs and/or IADLs without needing seated rest break. Baseline:  Goal status: INITIAL  5.  Pt will be able to demonstrate improved standing balance and positioning of RW as need to retreive items from upper and lower cabinets and refrigerator at Mod I level. Baseline:  Goal status: INITIAL  6.  Pt will demonstrate improved UE and grip strength as needed to engage in functional meal prep tasks (open containers and sustain grasp on items during meal prep). Baseline:  Goal status: INITIAL  ASSESSMENT:  CLINICAL IMPRESSION: [name] who was seen today for occupational therapy evaluation for upper body strenghtening and full ADL participation s/p infection of lumbar spine.  Pt currently lives with spouse in a single level home with one platform step to enter; pt has a PCA from 8:30-4 Mon- Fri. Pt will benefit from skilled occupational therapy services to address strength and coordination, ROM, pain management, balance, safety awareness, introduction of compensatory strategies/AE prn, and implementation of an HEP to improve participation and safety during ADLs and IADLs.   PERFORMANCE DEFICITS: in functional skills including ADLs, IADLs, ROM, strength, pain, Gross motor control, balance, body mechanics, endurance, decreased knowledge of precautions, decreased knowledge of use of DME, and UE functional use and psychosocial skills including coping strategies and environmental adaptation.   IMPAIRMENTS: are limiting patient from ADLs, IADLs, leisure, and social participation.   CO-MORBIDITIES: may have  co-morbidities  that affects occupational performance. Patient will benefit from skilled OT to address above impairments and improve overall function.  MODIFICATION OR ASSISTANCE TO COMPLETE EVALUATION: Min-Moderate modification of tasks or assist with assess necessary to complete an evaluation.  OT OCCUPATIONAL PROFILE AND HISTORY: Detailed assessment: Review of records and additional review of physical, cognitive, psychosocial history related to current functional performance.  CLINICAL DECISION MAKING: Moderate - several treatment options, min-mod task modification necessary  REHAB POTENTIAL: Good  EVALUATION COMPLEXITY: Moderate    PLAN:  OT FREQUENCY: 2x/week  OT DURATION: 8 weeks  PLANNED INTERVENTIONS: self care/ADL training, therapeutic exercise, therapeutic activity, neuromuscular re-education, balance training, functional mobility training,  moist heat, cryotherapy, patient/family education, psychosocial skills training, energy conservation, coping strategies training, and DME and/or AE instructions  RECOMMENDED OTHER SERVICES: NA  CONSULTED AND AGREED WITH PLAN OF CARE: Patient  PLAN FOR NEXT SESSION: Complete functional assessment Myrtis Ser and/or Golda Acre or other appropriate assessment).  Transfers and dynamic standing/reaching.   Rosalio Loud, OTR/L 03/15/2023, 8:55 AM

## 2023-03-15 ENCOUNTER — Encounter: Payer: Self-pay | Admitting: Physical Medicine & Rehabilitation

## 2023-03-15 ENCOUNTER — Encounter: Payer: Medicare Other | Attending: Physical Medicine & Rehabilitation | Admitting: Physical Medicine & Rehabilitation

## 2023-03-15 VITALS — BP 109/68 | HR 79 | Ht 61.0 in | Wt 138.0 lb

## 2023-03-15 DIAGNOSIS — M4626 Osteomyelitis of vertebra, lumbar region: Secondary | ICD-10-CM | POA: Diagnosis present

## 2023-03-15 DIAGNOSIS — S21209S Unspecified open wound of unspecified back wall of thorax without penetration into thoracic cavity, sequela: Secondary | ICD-10-CM | POA: Diagnosis present

## 2023-03-15 DIAGNOSIS — R339 Retention of urine, unspecified: Secondary | ICD-10-CM | POA: Diagnosis present

## 2023-03-15 DIAGNOSIS — S22070D Wedge compression fracture of T9-T10 vertebra, subsequent encounter for fracture with routine healing: Secondary | ICD-10-CM | POA: Insufficient documentation

## 2023-03-15 MED ORDER — METHOCARBAMOL 750 MG PO TABS: 750.0000 mg | ORAL_TABLET | Freq: Three times a day (TID) | ORAL | 1 refills | Status: DC | PRN

## 2023-03-15 NOTE — Progress Notes (Signed)
Subjective:    Patient ID: [name], female    DOB: 03/10/1956, 67 y.o.   MRN: [id number]  HPI  HPI   Excerpt from discharge summary 10/13/2022 Brief HPI:   [name] is a 67 y.o. female with history of PVT, T2DM, bipolar d/o, mild cognitive impairments, right femur fracture with RLE leg length discrepancy and gait disorder, fall 05/2022 with 3 column unstable fracture through prior scoliosis construct s/p instrumentation with exposed hardware and wound dehiscence. She was admitted on 08/09/22 for revision of lumbar wound with removal of hardware and revision L1-L5 fusion by Dr. Maurice Small. Post op course was significant for delirium with fevers, PNA with respiratory failure requiring intubation 2/01. MRI brain showed evidence of ventriculitis and MRI spine showed widely patent fusion with decrease in bone marrow edema L3.    Dr. Daiva Eves recommended IV antibiotics X 6 weeks empirically with flagyl to cover aspiration PNA. She required tracheostomy as well as foley for urinary retention and rectal tube due to frequent loose stools and to prevent wound contamination. WOC was following for wound VAC changes. She was downsized to CFS#4 prior prior to d/c. PT/OT was working with patient who was limited by weakness, cognitive deficits requiring increased time for processing as well as verbal and tactile cues to follow simple commands as well as max to total assist with ADLs and mobility. CIR was recommended due to functional decline.       Hospital Course: [name] was admitted to rehab 09/03/2022 for inpatient therapies to consist of PT, ST and OT at least three hours five days a week. Past admission physiatrist, therapy team and rehab RN have worked together to provide customized collaborative inpatient rehab. Her blood pressures were monitored on TID basis and have been stable. As mentation and swallow function improved, diet was advanced to regular textures. She was  tolerating PMSV and was decannulated without difficulty. Her diabetes has been monitored with ac/hs CBG checks and was being managed with SSI due to variable intake and was reasonably controlled. As swallow function improved, she was advanced to regular textures, intake has improved with family providing food from home. She did request resumption of metformin and d/c of CBG checks. She is tolerating metformin without SE except for am nausea which could be due to transition to oral antibiotics. Patient has been educated on importance of BS management to promote wound healing and recommended to  monitor this post d/c. Her CBG checks d/c as BS controlled and have been monitored weekly with weekly labs.    She was maintained on Vanc/cefepime thru 03/14 and as follow up MRI brain 03/13 showed  resolution of ventriculitis, she was transitioned to Levaquin and doxycycline bid per ID input. She is to continue on oral antibiotics X 4 weeks and follow up at [facility] on 04/16 for final decision on duration of antibiotic treatment. WOC has been following to give input on patient's wound care and for monitoring. Wound VAC was discontinued on 02/21 and wet to dry dressing changes with Aquacel Ag was initiated. She did develop some slough on wound bed and metahoney added initially but changed to santyl which has helped to debride it. Patient did report that she plans  on showering at home and WOC recommended changing back to Aquacel for antibacterial protection.    Ensure and protein supplements added to help promote wound healing as as mentation improved but she had declined these frequently.  Voiding trial was attempted with  increase in tamsulosin as well as addition of urecholine. She continued to require I/O caths and patient elected on  foley with plans for voiding trial on outpatient basis.  She has had high levels of anxiety and ego support has been provided by rehab team. Her mentation has improved with cognition trending  back to baseline. She expressed concerns about her psychiatric medication regimen as multiple meds has been d/c while on acute and requested psychiatry input. Her home Wellbutrin was resumed on 03/21 and Dr. Viviano Simas who evaluated patient felt that patient was at her baseline and no additional meds were recommended.    Pain control is improving and she has been educated on importance of weaning narcotics gradually. She continues Butran's patch as well as low dose gabapentin as sedation was reported with attempts at up titration of latter.   She started making progress and LOS was extended by a week to help achieve higher goals. She requires min assist with ADL and mobility. She will continue to receive follow up HHPT, HHOT, HHST, HHaide and HHRN by [facility] after discharge.      Rehab course: During patient's stay in rehab weekly team conferences were held to monitor patient's progress, set goals and discuss barriers to discharge. At admission, patient required +2 total assist with ADL tasks and mod to total assist with pregait activity. She exhibited deficits in recall, attention, insight and required min to mod assist for processing and attention to tasks. She  has had improvement in activity tolerance, balance, postural control as well as ability to compensate for deficits. She has had improvement in functional use RUE and RLE as well as improvement in awareness. She requires min assist with ADL tasks.  She requires CGA for transfers and to ambulate 42 feet with RW and cues for sequencing.  She is able to complete functional cognitive tasks with min assist and requires min cues to self correct errors and for re-direction to tasks. Family education has been completed and family has hired caregivers to assist with care.     Interval history 12/10/2022 [name] is here for follow-up after CIR and above treatment.  She was also seen by [name] on 11/09/2022.  She reports she is continue to  work with physical therapy however she was discharged by OT.  She says after several weeks she may be able to transition to outpatient therapy.  Foley has been removed and she is voiding on her own, this does require to get up more frequently.  Her back pain is currently controlled with gabapentin, buprenorphine that was increased to 10 mcg/h patch and as needed oxycodone.  She has been seen by Dr. Daiva Eves infectious disease and is continued on antibiotics of doxycycline and Levaquin.  She continues to follow-up with Dr. Johnsie Cancel.  She had a CT of her T-spine and L-spine recently that showed a new compression fracture.   Interval history 03/15/2023 Ms. [name] is here for follow-up regarding conditions listed above.  She reports she is doing well overall.  She continues to work with physical therapy and Occupational Therapy and feels like she is making good progress.  She says she is discharged from her wound care clinic because her lower back wound is now closed.  She is no longer on tramadol, buprenorphine or oxycodone and is now on Flexeril for her back pain.  She feels like therapy is causing some soreness in her back.  Pain does not shoot down her legs.  Reports  she has a TENS and this provides minimal benefit.     Pain Inventory Average Pain 5 Pain Right Now 5 My pain is constant and aching  In the last 24 hours, has pain interfered with the following? General activity 4 Relation with others 4 Enjoyment of life 4 What TIME of day is your pain at its worst? daytime Sleep (in general) Fair  Pain is worse with: bending, sitting, and standing Pain improves with: rest and heat/ice Relief from Meds: 1  Family History  Problem Relation Age of Onset   Other Mother        alive   Stroke Father 45       deceased   Dementia Father    Chorea Maternal Grandfather    Dementia Maternal Aunt    Dementia Maternal Aunt    Heart attack Other        multiple uncles have died with myocardial  infarction   Social History   Socioeconomic History   Marital status: Married    Spouse name: Not on file   Number of children: Not on file   Years of education: Not on file   Highest education level: Master's degree (e.g., MA, MS, MEng, MEd, MSW, MBA)  Occupational History   Occupation: Magazine features editor: SELF-EMPLOYED    Comment: former  Tobacco Use   Smoking status: Former    Types: Cigarettes    Passive exposure: Never   Smokeless tobacco: Never   Tobacco comments:    03/30/21 smokes 2 cigs daily.      08/06/22 [name] stated that the patient is not smoking while at [facility] for rehab. [facility]  Vaping Use   Vaping status: Never Used  Substance and Sexual Activity   Alcohol use: No   Drug use: No   Sexual activity: Not Currently    Birth control/protection: Surgical    Comment: Hysterectomy  Other Topics Concern   Not on file  Social History Narrative   Right handed   Caffeine 2-3 cups daily    Lives at home with husband    Social Determinants of Health   Financial Resource Strain: Not on file  Food Insecurity: No Food Insecurity (06/27/2022)   Hunger Vital Sign    Worried About Running Out of Food in the Last Year: Never true    Ran Out of Food in the Last Year: Never true  Transportation Needs: No Transportation Needs (06/27/2022)   PRAPARE - Administrator, Civil Service (Medical): No    Lack of Transportation (Non-Medical): No  Physical Activity: Not on file  Stress: Not on file  Social Connections: Not on file   Past Surgical History:  Procedure Laterality Date   ABDOMINAL HYSTERECTOMY     BACK SURGERY     CARDIAC CATHETERIZATION N/A 06/23/2015   Procedure: Left Heart Cath and Coronary Angiography;  Surgeon: Laurey Morale, MD;  Location: [facility] INVASIVE CV LAB;  Service: Cardiovascular;  Laterality: N/A;   CARDIAC CATHETERIZATION  1960   "VSD was so small; didn't need repaired"   EXAM UNDER ANESTHESIA WITH MANIPULATION OF HIP Right  06/02/2014   dr Jerl Santos   FRACTURE SURGERY     HERNIA REPAIR     HIP CLOSED REDUCTION Right 06/02/2014   Procedure: CLOSED MANIPULATION HIP;  Surgeon: Velna Ochs, MD;  Location: [facility] OR;  Service: Orthopedics;  Laterality: Right;   JOINT REPLACEMENT     JOINT REPLACEMENT     POSTERIOR  LUMBAR FUSION 4 LEVEL N/A 06/26/2022   Procedure: Lumbar One To Lumbar Five Posterior Instrumented Fusion;  Surgeon: Jadene Pierini, MD;  Location: [facility] OR;  Service: Neurosurgery;  Laterality: N/A;   REFRACTIVE SURGERY Bilateral    SHOULDER ARTHROSCOPY Right    SHOULDER OPEN ROTATOR CUFF REPAIR Right    SPINAL FUSION  1996   "t10 down to my coccyx   SPINE HARDWARE REMOVAL     TOTAL ABDOMINAL HYSTERECTOMY     TOTAL HIP ARTHROPLASTY Right 05/10/2014   [facility]      by dr Cristal Deer olcott   TOTAL KNEE ARTHROPLASTY Left    TOTAL SHOULDER ARTHROPLASTY Left 08/19/2016   Procedure: TOTAL SHOULDER ARTHROPLASTY;  Surgeon: Jones Broom, MD;  Location: [facility] OR;  Service: Orthopedics;  Laterality: Left;  Left total shoulder replacement   Past Surgical History:  Procedure Laterality Date   ABDOMINAL HYSTERECTOMY     BACK SURGERY     CARDIAC CATHETERIZATION N/A 06/23/2015   Procedure: Left Heart Cath and Coronary Angiography;  Surgeon: Laurey Morale, MD;  Location: [facility] INVASIVE CV LAB;  Service: Cardiovascular;  Laterality: N/A;   CARDIAC CATHETERIZATION  1960   "VSD was so small; didn't need repaired"   EXAM UNDER ANESTHESIA WITH MANIPULATION OF HIP Right 06/02/2014   dr Jerl Santos   FRACTURE SURGERY     HERNIA REPAIR     HIP CLOSED REDUCTION Right 06/02/2014   Procedure: CLOSED MANIPULATION HIP;  Surgeon: Velna Ochs, MD;  Location: [facility] OR;  Service: Orthopedics;  Laterality: Right;   JOINT REPLACEMENT     JOINT REPLACEMENT     POSTERIOR LUMBAR FUSION 4 LEVEL N/A 06/26/2022   Procedure: Lumbar One To Lumbar Five Posterior Instrumented Fusion;  Surgeon: Jadene Pierini, MD;  Location: [facility]  OR;  Service: Neurosurgery;  Laterality: N/A;   REFRACTIVE SURGERY Bilateral    SHOULDER ARTHROSCOPY Right    SHOULDER OPEN ROTATOR CUFF REPAIR Right    SPINAL FUSION  1996   "t10 down to my coccyx   SPINE HARDWARE REMOVAL     TOTAL ABDOMINAL HYSTERECTOMY     TOTAL HIP ARTHROPLASTY Right 05/10/2014   [facility]      by dr Cristal Deer olcott   TOTAL KNEE ARTHROPLASTY Left    TOTAL SHOULDER ARTHROPLASTY Left 08/19/2016   Procedure: TOTAL SHOULDER ARTHROPLASTY;  Surgeon: Jones Broom, MD;  Location: [facility] OR;  Service: Orthopedics;  Laterality: Left;  Left total shoulder replacement   Past Medical History:  Diagnosis Date   Anemia    Anxiety    Arthritis    Bipolar disorder (HCC)    Depression    GERD (gastroesophageal reflux disease)    Heart murmur    "related to VSD"   High cholesterol    History of blood transfusion    "related to OR" (08/19/2016)   History of hiatal hernia    Hypertension    Hyperthyroidism    Mild cognitive impairment 09/06/2018   Paroxysmal ventricular tachycardia (HCC)    Type II diabetes mellitus (HCC)    UTI (urinary tract infection)    being treated with Keflex   Ventricular septal defect    Ventriculitis of brain due to bacteria 11/02/2022   Vertebral fracture, osteoporotic (HCC) 01/06/2023   BP 109/68   Pulse 79   Ht 5\' 1"  (1.549 m)   Wt 138 lb (62.6 kg)   SpO2 97%   BMI 26.07 kg/m   Opioid Risk Score:   Fall Risk Score:  `1  Depression screen [facility] 2/9     01/06/2023   10:36 AM 12/10/2022   11:13 AM 12/09/2022   10:42 AM 11/02/2022    3:02 PM  Depression screen PHQ 2/9  Decreased Interest 0 0 0 0  Down, Depressed, Hopeless 1 0 0 0  PHQ - 2 Score 1 0 0 0     Review of Systems  Musculoskeletal:  Positive for back pain.       RT hip  All other systems reviewed and are negative.      Objective:   Physical Exam   Gen: no distress, normal appearing HEENT: oral mucosa pink and moist, NCAT Chest: normal effort, normal rate of  breathing Abd: soft, non-distended Ext: no edema Psych: pleasant, normal affect Skin: Warm dry Neuro: Alert and awake, follows commands, cranial 2-12 grossly intact Strength 4 to 4+ out of 5 in bilateral upper extremities, bilateral hip flexion 3 to 4- out of 5 Bilateral knee extension, ankle PF and DF 4 out of 5 Sensation intact light touch in all 4 extremities Musculoskeletal: Tenderness lumbar paraspinal muscles   CT lumbar spine 22-Dec-2022 IMPRESSION: 1. Pronounced chronic osteopenia. Prior decompression and fusion throughout the lumbar spine.   2. L3 superior endplate/superior body non-acute fracture AND subtle associated horizontal fracture through the chronic posterior element bone mass there (series 9, image 53 today). But no displacement over this series of exams.   3. Difficult to exclude nondisplaced S3/S4 sacral fracture, with mild presacral stranding there.   4. But no other acute osseous abnormality identified. And elsewhere the chronic T12 through sacral ankylosis/arthrodesis appears intact.   5. T10 compression fracture is detailed separately today on CT Thoracic Spine.   We discussed getting stay CT thoracic spine 12-22-2022 IMPRESSION: 1. T10 compression fracture with 12% loss of height is new since 08/19/2022. No retropulsion or other complicating features. Unchanged adjacent chronic T10-T11 and T12-L1 posterior element ankylosis, with chronic non fusion of T11-T12. 2. No other acute osseous abnormality identified in the thoracic spine. Generalized osteopenia. Chronic mild T5, T6, and T11 compression fractures. 3. Increased elevation of the left hemidiaphragm and new left lung base consolidation since February. But small bilateral pleural effusions have resolved. 4. No CT evidence of thoracic spinal stenosis.        Assessment & Plan:   Osteomyelitis, infection of lumbar surgical site complicated by meningitis/ventriculitis.              -Continue  to follow-up with Dr. Johnsie Cancel              -Continue to follow-up with infectious disease, she was seen about a week ago             -She reports she was discharged from wound clinic as this has closed             -Continue outpatient PT/OT   Spinal compression fracture with lower back pain             -She is previously on buprenorphine, oxycodone and later tramadol  -Continue current dose of gabapentin  -Will order Robaxin 750 mg as needed, discontinue Flexeril   Urinary retention             -Reports this has improved and she is voiding ok

## 2023-03-16 ENCOUNTER — Ambulatory Visit: Payer: Medicare Other | Admitting: Physical Therapy

## 2023-03-16 NOTE — Progress Notes (Signed)
ZERLINA, FLAMER ([id number]) [id number] Page 1 of 7 Visit Report for 03/14/2023 Arrival Information Details Patient Name: Date of Service: [name] 03/14/2023 2:15 PM Medical Record Number: [id number] Patient Account Number: [id number] Date of Birth/Sex: Treating RN: 14-Jul-1956 (67 y.o. [name] Primary Care Shade Rivenbark: Aliene Beams Other Clinician: Referring Jahnia Hewes: Treating Lieutenant Abarca/Extender: Marguerite Olea in Treatment: 9 Visit Information History Since Last Visit All ordered tests and consults were completed: Yes Patient Arrived: Wheel Chair Added or deleted any medications: No Arrival Time: 14:19 Any new allergies or adverse reactions: No Accompanied By: caregiver Had a fall or experienced change in No Transfer Assistance: None activities of daily living that may affect Patient Identification Verified: Yes risk of falls: Secondary Verification Process Completed: Yes Signs or symptoms of abuse/neglect since last visito No Patient Requires Transmission-Based Precautions: No Hospitalized since last visit: No Patient Has Alerts: No Implantable device outside of the clinic excluding No cellular tissue based products placed in the center since last visit: Has Dressing in Place as Prescribed: Yes Pain Present Now: No Electronic Signature(s) Signed: 03/15/2023 2:02:59 PM By: Brenton Grills Entered By: Brenton Grills on 03/14/2023 14:20:30 -------------------------------------------------------------------------------- Clinic Level of Care Assessment Details Patient Name: Date of Service: [name] 03/14/2023 2:15 PM Medical Record Number: [id number] Patient Account Number: [id number] Date of Birth/Sex: Treating RN: November 13, 1955 (67 y.o. Arta Silence Primary Care Chares Slaymaker: Aliene Beams Other Clinician: Referring Aedyn Kempfer: Treating Keisuke Hollabaugh/Extender: Marguerite Olea in Treatment: 9 Clinic Level of Care Assessment Items TOOL 4 Quantity Score X- 1 0 Use when only an EandM is performed on FOLLOW-UP visit ASSESSMENTS - Nursing Assessment / Reassessment X- 1 10 Reassessment of Co-morbidities (includes updates in patient status) X- 1 5 Reassessment of Adherence to Treatment Plan ASSESSMENTS - Wound and Skin A ssessment / Reassessment X - Simple Wound Assessment / Reassessment - one wound 1 5 []  - 0 Complex Wound Assessment / Reassessment - multiple wounds X- 1 10 Dermatologic / Skin Assessment (not related to wound area) ASSESSMENTS - Focused Assessment []  - 0 Circumferential Edema Measurements - multi extremities []  - 0 Nutritional Assessment / Counseling / Intervention TOSSIE, LANTRIP ([id number]) [id number] Page 2 of 7 []  - 0 Lower Extremity Assessment (monofilament, tuning fork, pulses) []  - 0 Peripheral Arterial Disease Assessment (using hand held doppler) ASSESSMENTS - Ostomy and/or Continence Assessment and Care []  - 0 Incontinence Assessment and Management []  - 0 Ostomy Care Assessment and Management (repouching, etc.) PROCESS - Coordination of Care X - Simple Patient / Family Education for ongoing care 1 15 []  - 0 Complex (extensive) Patient / Family Education for ongoing care X- 1 10 Staff obtains Chiropractor, Records, T Results / Process Orders est []  - 0 Staff telephones HHA, Nursing Homes / Clarify orders / etc []  - 0 Routine Transfer to another Facility (non-emergent condition) []  - 0 Routine Hospital Admission (non-emergent condition) []  - 0 New Admissions / Manufacturing engineer / Ordering NPWT Apligraf, etc. , []  - 0 Emergency Hospital Admission (emergent condition) X- 1 10 Simple Discharge Coordination []  - 0 Complex (extensive) Discharge Coordination PROCESS - Special Needs []  - 0 Pediatric / Minor Patient Management []  - 0 Isolation Patient Management []  -  0 Hearing / Language / Visual special needs []  - 0 Assessment of Community assistance (transportation, D/C planning, etc.) []  - 0 Additional assistance / Altered mentation []  - 0 Support Surface(s) Assessment (bed, cushion, seat, etc.) INTERVENTIONS -  Wound Cleansing / Measurement X - Simple Wound Cleansing - one wound 1 5 []  - 0 Complex Wound Cleansing - multiple wounds X- 1 5 Wound Imaging (photographs - any number of wounds) []  - 0 Wound Tracing (instead of photographs) X- 1 5 Simple Wound Measurement - one wound []  - 0 Complex Wound Measurement - multiple wounds INTERVENTIONS - Wound Dressings []  - 0 Small Wound Dressing one or multiple wounds []  - 0 Medium Wound Dressing one or multiple wounds []  - 0 Large Wound Dressing one or multiple wounds []  - 0 Application of Medications - topical []  - 0 Application of Medications - injection INTERVENTIONS - Miscellaneous []  - 0 External ear exam []  - 0 Specimen Collection (cultures, biopsies, blood, body fluids, etc.) []  - 0 Specimen(s) / Culture(s) sent or taken to Lab for analysis []  - 0 Patient Transfer (multiple staff / Nurse, adult / Similar devices) []  - 0 Simple Staple / Suture removal (25 or less) []  - 0 Complex Staple / Suture removal (26 or more) []  - 0 Hypo / Hyperglycemic Management (close monitor of Blood Glucose) CHARNE, QUAMME ([id number]) [id number] Page 3 of 7 []  - 0 Ankle / Brachial Index (ABI) - do not check if billed separately X- 1 5 Vital Signs Has the patient been seen at the hospital within the last three years: Yes Total Score: 85 Level Of Care: New/Established - Level 3 Electronic Signature(s) Signed: 03/15/2023 5:33:37 PM By: Shawn Stall RN, BSN Entered By: Shawn Stall on 03/14/2023 15:02:02 -------------------------------------------------------------------------------- Encounter Discharge Information Details Patient Name: Date of Service: [name] 03/14/2023 2:15 PM Medical Record Number: [id number] Patient Account Number: [id number] Date of Birth/Sex: Treating RN: 1955/11/30 (67 y.o. Arta Silence Primary Care Isak Sotomayor: Aliene Beams Other Clinician: Referring Dakia Schifano: Treating Dhrithi Riche/Extender: Marguerite Olea in Treatment: 9 Encounter Discharge Information Items Discharge Condition: Stable Ambulatory Status: Wheelchair Discharge Destination: Home Transportation: Private Auto Accompanied By: caregiver Schedule Follow-up Appointment: No Clinical Summary of Care: Electronic Signature(s) Signed: 03/15/2023 5:33:37 PM By: Shawn Stall RN, BSN Entered By: Shawn Stall on 03/14/2023 15:03:07 -------------------------------------------------------------------------------- Lower Extremity Assessment Details Patient Name: Date of Service: [name] 03/14/2023 2:15 PM Medical Record Number: [id number] Patient Account Number: [id number] Date of Birth/Sex: Treating RN: 1956-01-02 (67 y.o. [name] Primary Care Sayan Aldava: Aliene Beams Other Clinician: Referring Javon Snee: Treating Sedra Morfin/Extender: Marguerite Olea in Treatment: 9 Electronic Signature(s) Signed: 03/15/2023 2:02:59 PM By: Brenton Grills Entered By: Brenton Grills on 03/14/2023 14:23:10 -------------------------------------------------------------------------------- Multi Wound Chart Details Patient Name: Date of Service: [name] 03/14/2023 2:15 PM NEVEAH, HAYEN ([id number]) [id number] Page 4 of 7 Medical Record Number: [id number] Patient Account Number: [id number] Date of Birth/Sex: Treating RN: [birth year] (67 y.o. F) Primary Care Charina Fons: Aliene Beams Other Clinician: Referring Alexandria Current: Treating Sander Remedios/Extender: Marguerite Olea in Treatment: 9 Vital Signs Height(in): 61 Pulse(bpm): 93 Weight(lbs):  139 Blood Pressure(mmHg): 149/84 Body Mass Index(BMI): 26.3 Temperature(F): 98.3 Respiratory Rate(breaths/min): 18 [1:Photos:] [N/A:N/A] Back N/A N/A Wound Location: Surgical Injury N/A N/A Wounding Event: Open Surgical Wound N/A N/A Primary Etiology: Cataracts, Anemia, Hypertension, N/A N/A Comorbid History: Type II Diabetes, Osteoarthritis, Confinement Anxiety 08/11/2022 N/A N/A Date Acquired: 9 N/A N/A Weeks of Treatment: Healed - Epithelialized N/A N/A Wound Status: No N/A N/A Wound Recurrence: 0x0x0 N/A N/A Measurements L x W x D (cm) 0 N/A N/A A (cm) : rea 0 N/A N/A Volume (cm) : 100.00% N/A  N/A % Reduction in Area: 100.00% N/A N/A % Reduction in Volume: Full Thickness Without Exposed N/A N/A Classification: Support Structures Medium N/A N/A Exudate Amount: Serosanguineous N/A N/A Exudate Type: red, brown N/A N/A Exudate Color: Large (67-100%) N/A N/A Granulation Amount: Red N/A N/A Granulation Quality: Small (1-33%) N/A N/A Necrotic Amount: Fat Layer (Subcutaneous Tissue): Yes N/A N/A Exposed Structures: Fascia: No Tendon: No Muscle: No Joint: No Bone: No None N/A N/A Epithelialization: Scarring: Yes N/A N/A Periwound Skin Texture: Hemosiderin Staining: Yes N/A N/A Periwound Skin Color: No Abnormality N/A N/A Temperature: Treatment Notes Electronic Signature(s) Signed: 03/14/2023 4:36:31 PM By: Geralyn Corwin DO Entered By: Geralyn Corwin on 03/14/2023 15:06:39 -------------------------------------------------------------------------------- Multi-Disciplinary Care Plan Details Patient Name: Date of Service: [name] 03/14/2023 2:15 PM Medical Record Number: [id number] Patient Account Number: [id number] Date of Birth/Sex: Treating RN: [birth year] (67 y.o. [address] Sadhika, Blew Zearing ([id number]) [id number] Page 5 of 7 Primary Care Syble Picco: Aliene Beams Other Clinician: Referring  Ahmiya Abee: Treating Jenisis Harmsen/Extender: Marguerite Olea in Treatment: 9 Active Inactive Electronic Signature(s) Signed: 03/15/2023 5:33:37 PM By: Shawn Stall RN, BSN Entered By: Shawn Stall on 03/14/2023 15:03:31 -------------------------------------------------------------------------------- Pain Assessment Details Patient Name: Date of Service: [name] 03/14/2023 2:15 PM Medical Record Number: [id number] Patient Account Number: [id number] Date of Birth/Sex: Treating RN: 1956-05-02 (67 y.o. [name] Primary Care Keison Glendinning: Aliene Beams Other Clinician: Referring Jason Frisbee: Treating Dahir Ayer/Extender: Marguerite Olea in Treatment: 9 Active Problems Location of Pain Severity and Description of Pain Patient Has Paino Yes Site Locations Pain Location: Generalized Pain Character of Pain Describe the Pain: Aching Pain Management and Medication Current Pain Management: Electronic Signature(s) Signed: 03/15/2023 2:02:59 PM By: Brenton Grills Entered By: Brenton Grills on 03/14/2023 14:22:55 -------------------------------------------------------------------------------- Patient/Caregiver Education Details Patient Name: Date of Service: [name] 8/26/2024andnbsp2:15 PM Porter, Gardner ([id number]) [id number] Page 6 of 7 Medical Record Number: [id number] Patient Account Number: [id number] Date of Birth/Gender: Treating RN: [birth year] (67 y.o. Arta Silence Primary Care Physician: Aliene Beams Other Clinician: Referring Physician: Treating Physician/Extender: Marguerite Olea in Treatment: 9 Education Assessment Education Provided To: Patient Education Topics Provided Wound/Skin Impairment: Handouts: Caring for Your Ulcer Methods: Explain/Verbal Responses: Reinforcements needed Electronic Signature(s) Signed: 03/15/2023 5:33:37 PM By:  Shawn Stall RN, BSN Entered By: Shawn Stall on 03/14/2023 14:22:10 -------------------------------------------------------------------------------- Wound Assessment Details Patient Name: Date of Service: [name] 03/14/2023 2:15 PM Medical Record Number: [id number] Patient Account Number: [id number] Date of Birth/Sex: Treating RN: November 13, 1955 (67 y.o. Debara Pickett, Millard.Loa Primary Care Onesti Bonfiglio: Aliene Beams Other Clinician: Referring Aliece Honold: Treating Daneesha Quinteros/Extender: Marguerite Olea in Treatment: 9 Wound Status Wound Number: 1 Primary Open Surgical Wound Etiology: Wound Location: Back Wound Healed - Epithelialized Wounding Event: Surgical Injury Status: Date Acquired: 08/11/2022 Comorbid Cataracts, Anemia, Hypertension, Type II Diabetes, Weeks Of Treatment: 9 History: Osteoarthritis, Confinement Anxiety Clustered Wound: No Photos Wound Measurements Length: (cm) Width: (cm) Depth: (cm) Area: (cm) Volume: (cm) 0 % Reduction in Area: 100% 0 % Reduction in Volume: 100% 0 Epithelialization: None 0 Tunneling: No 0 Undermining: No Wound Description Classification: Full Thickness Without Exposed Support Structures Exudate Amount: Medium Exudate Type: Serosanguineous Easton, Meda G ([id number]) Exudate Color: red, brown Foul Odor After Cleansing: No Slough/Fibrino Yes [id number] Page 7 of 7 Wound Bed Granulation Amount: Large (67-100%) Exposed Structure Granulation Quality: Red Fascia Exposed: No Necrotic Amount: Small (1-33%) Fat Layer (Subcutaneous Tissue) Exposed:  Yes Tendon Exposed: No Muscle Exposed: No Joint Exposed: No Bone Exposed: No Periwound Skin Texture Texture Color No Abnormalities Noted: No No Abnormalities Noted: No Scarring: Yes Hemosiderin Staining: Yes Moisture Temperature / Pain No Abnormalities Noted: No Temperature: No Abnormality Electronic Signature(s) Signed:  03/15/2023 5:33:37 PM By: Shawn Stall RN, BSN Entered By: Shawn Stall on 03/14/2023 14:48:25 -------------------------------------------------------------------------------- Vitals Details Patient Name: Date of Service: [name] 03/14/2023 2:15 PM Medical Record Number: [id number] Patient Account Number: [id number] Date of Birth/Sex: Treating RN: 25-Feb-1956 (67 y.o. [name] Primary Care Haylei Cobin: Aliene Beams Other Clinician: Referring Janeil Schexnayder: Treating Alphonse Asbridge/Extender: Marguerite Olea in Treatment: 9 Vital Signs Time Taken: 14:21 Temperature (F): 98.3 Height (in): 61 Pulse (bpm): 93 Weight (lbs): 139 Respiratory Rate (breaths/min): 18 Body Mass Index (BMI): 26.3 Blood Pressure (mmHg): 149/84 Reference Range: 80 - 120 mg / dl Electronic Signature(s) Signed: 03/15/2023 2:02:59 PM By: Brenton Grills Entered By: Brenton Grills on 03/14/2023 14:22:25

## 2023-03-22 ENCOUNTER — Encounter: Payer: Self-pay | Admitting: Physical Therapy

## 2023-03-22 ENCOUNTER — Ambulatory Visit: Payer: Medicare Other | Attending: Physical Medicine & Rehabilitation | Admitting: Physical Therapy

## 2023-03-22 DIAGNOSIS — R2689 Other abnormalities of gait and mobility: Secondary | ICD-10-CM | POA: Diagnosis present

## 2023-03-22 DIAGNOSIS — R2681 Unsteadiness on feet: Secondary | ICD-10-CM | POA: Insufficient documentation

## 2023-03-22 DIAGNOSIS — M6281 Muscle weakness (generalized): Secondary | ICD-10-CM | POA: Insufficient documentation

## 2023-03-22 NOTE — Therapy (Signed)
OUTPATIENT PHYSICAL THERAPY NEURO TREATMENT   Patient Name: [name] MRN: [id number] DOB:January 29, 1956, 67 y.o., female Today's Date: 03/22/2023   PCP: Aliene Beams, MD REFERRING PROVIDER: Fanny Dance, MD    END OF SESSION:  PT End of Session - 03/22/23 1224     Visit Number 10    Number of Visits 17    Date for PT Re-Evaluation 04/08/23    Authorization Type [employer] Medicare    Progress Note Due on Visit --   (PN completed at visit 9)   PT Start Time 1230    PT Stop Time 1314    PT Time Calculation (min) 44 min    Activity Tolerance Patient tolerated treatment well    Behavior During Therapy WFL for tasks assessed/performed                   Past Medical History:  Diagnosis Date   Anemia    Anxiety    Arthritis    Bipolar disorder (HCC)    Depression    GERD (gastroesophageal reflux disease)    Heart murmur    "related to VSD"   High cholesterol    History of blood transfusion    "related to OR" (08/19/2016)   History of hiatal hernia    Hypertension    Hyperthyroidism    Mild cognitive impairment 09/06/2018   Paroxysmal ventricular tachycardia (HCC)    Type II diabetes mellitus (HCC)    UTI (urinary tract infection)    being treated with Keflex   Ventricular septal defect    Ventriculitis of brain due to bacteria 11/02/2022   Vertebral fracture, osteoporotic (HCC) 01/06/2023   Past Surgical History:  Procedure Laterality Date   ABDOMINAL HYSTERECTOMY     BACK SURGERY     CARDIAC CATHETERIZATION N/A 06/23/2015   Procedure: Left Heart Cath and Coronary Angiography;  Surgeon: Laurey Morale, MD;  Location: [facility] INVASIVE CV LAB;  Service: Cardiovascular;  Laterality: N/A;   CARDIAC CATHETERIZATION  1960   "VSD was so small; didn't need repaired"   EXAM UNDER ANESTHESIA WITH MANIPULATION OF HIP Right 06/02/2014   dr Jerl Santos   FRACTURE SURGERY     HERNIA REPAIR     HIP CLOSED REDUCTION Right 06/02/2014   Procedure: CLOSED MANIPULATION  HIP;  Surgeon: Velna Ochs, MD;  Location: [facility] OR;  Service: Orthopedics;  Laterality: Right;   JOINT REPLACEMENT     JOINT REPLACEMENT     POSTERIOR LUMBAR FUSION 4 LEVEL N/A 06/26/2022   Procedure: Lumbar One To Lumbar Five Posterior Instrumented Fusion;  Surgeon: Jadene Pierini, MD;  Location: [facility] OR;  Service: Neurosurgery;  Laterality: N/A;   REFRACTIVE SURGERY Bilateral    SHOULDER ARTHROSCOPY Right    SHOULDER OPEN ROTATOR CUFF REPAIR Right    SPINAL FUSION  1996   "t10 down to my coccyx   SPINE HARDWARE REMOVAL     TOTAL ABDOMINAL HYSTERECTOMY     TOTAL HIP ARTHROPLASTY Right 05/10/2014   [email or web address]      by dr Cristal Deer olcott   TOTAL KNEE ARTHROPLASTY Left    TOTAL SHOULDER ARTHROPLASTY Left 08/19/2016   Procedure: TOTAL SHOULDER ARTHROPLASTY;  Surgeon: Jones Broom, MD;  Location: [facility] OR;  Service: Orthopedics;  Laterality: Left;  Left total shoulder replacement   Patient Active Problem List   Diagnosis Date Noted   Nonobstructive atherosclerosis of coronary artery 02/10/2023   Vertebral fracture, osteoporotic (HCC) 01/06/2023   Ventriculitis of brain due to bacteria 11/02/2022  Anemia of chronic illness 10/12/2022   Bipolar disorder, in partial remission, most recent episode depressed (HCC) 10/07/2022   Adjustment disorder with mixed anxiety and depressed mood 09/30/2022   Chronic wound 09/25/2022   Hyperglycemia 09/25/2022   Chronic midline thoracic back pain 09/17/2022   Primary insomnia 09/17/2022   Chronic combined systolic and diastolic CHF (congestive heart failure) (HCC) 09/14/2022   Acute blood loss anemia 09/09/2022   Infection of lumbar spine (HCC) 09/03/2022   Malnutrition of moderate degree 08/20/2022   Hardware complicating wound infection (HCC) 08/20/2022   Infection of deep incisional surgical site after procedure 08/20/2022   Aspiration pneumonia of both lungs (HCC) 08/19/2022   Acute respiratory failure with hypoxia (HCC) 08/19/2022    Sepsis (HCC) 08/19/2022   Seizure (HCC) 08/19/2022   Wound dehiscence 08/09/2022   Delayed surgical wound healing 07/08/2022   Fracture of lumbar spine without cord injury (HCC) 06/26/2022   Lumbar vertebral fracture (HCC) 06/25/2022   Mood disorder (HCC) 09/29/2021   Mild cognitive impairment 09/06/2018   Hyponatremia 02/28/2018   S/P shoulder replacement, left 08/19/2016   Anxiety 10/17/2014   Acid reflux 10/17/2014   BP (high blood pressure) 10/17/2014   Arthritis, degenerative 10/17/2014   Adult hypothyroidism 10/17/2014   UTI (urinary tract infection) 06/03/2014   Fracture of bone adjacent to prosthesis 06/03/2014   Diabetes (HCC) 06/02/2014   Peri-prosthetic fracture of femur following total hip arthroplasty 06/02/2014   Urinary retention 05/13/2014   Chronic pain 05/10/2014   History of hip surgery 05/10/2014   UNSPECIFIED HEART FAILURE 06/24/2010   UNSPECIFIED CONGENITAL DEFECT OF SEPTAL CLOSURE 06/24/2010   TOBACCO ABUSE 03/18/2010   Secondary cardiomyopathy (HCC) 03/18/2010   DM 06/13/2009   HYPERTENSION, UNSPECIFIED 06/13/2009   VENTRICULAR TACHYCARDIA 06/13/2009   VENTRICULAR SEPTAL DEFECT, CONGENITAL 06/13/2009   Diabetes mellitus, type 2 (HCC) 06/13/2009   Essential (primary) hypertension 06/13/2009    ONSET DATE: 01/30/2023 (MD referral)  REFERRING DIAG: M46.26 (ICD-10-CM) - Infection of lumbar spine (HCC)   THERAPY DIAG:  Muscle weakness (generalized)  Unsteadiness on feet  Other abnormalities of gait and mobility  Rationale for Evaluation and Treatment: Rehabilitation  SUBJECTIVE:                                                                                                                                                                                             SUBJECTIVE STATEMENT: [place]  Kind of achy after the long car ride.     Pt accompanied by: self  PERTINENT HISTORY: Hx of chronic wound (thoracic  spine area) having had a  postoperative surgery complicated by wound infection with CSF leak and ventriculitis with concern for hardware potentially being involved; osteomyelitis; spinal compression fracture (T10)   67 y.o. female with hx of scoliosis surgery with extensive lower thoracic and lumbar fusion roughly 104yrs ago, she suffered ground level fall in late November, where she fractures through all three columns of L3, thus went to OR on 12/9 for stabilization, new  posterolateral instrumentation fusion on June 26, 2022 complicated by Wound dehiscence with exposed hardware and unstable lumbar spine fracture status post revision of lumbar wound with removal of L1-3 4 and 5 lumbar screws and rod and removal of right L1 screw and revision of L1-L5 posterior instrumented fusion on August 09, 2022 and now concern for postoperative wound infection and ventriculitis. She had complicated protracted course in the icu, but eventually was discharged to CIR on vancomycin and cefepime through 3/14 for ventriculitis and hw complicating wound infection.    Mri of brain on 09/29/22- resolution of ventriculitis.  PAIN:  Are you having pain? Yes: NPRS scale: 4/10 Pain location: lower R flank around fusion site  Pain description: throbbing, achy Aggravating factors: longer car rides  Relieving factors: sitting  PRECAUTIONS: Back, Fall, and Other: hx of osteoporosis and fractures ; avoid twisting; avoid excess bending; is to log roll for bed mobility; (thoracic) wound being followed by wound care.  RED FLAGS: None   WEIGHT BEARING RESTRICTIONS: No  FALLS: Has patient fallen in last 6 months? Yes. Number of falls 1  LIVING ENVIRONMENT: Lives with: lives with their spouse; has caregiver at home; ramp to enter; one level home Lives in: House/apartment Stairs: Yes: Internal: 1 steps; none; family's homes have steps to get in Has following equipment at home: Dan Humphreys - 2 wheeled, Wheelchair (manual), Shower  bench, and bed side commode *has used shoe with lift in the distant past for Leg length discrepancy PLOF: Independent using cane  PATIENT GOALS: Wants to work on balance, walking, stairs, to get in and out of a car betterto be independent again  OBJECTIVE:     TODAY'S TREATMENT: 03/22/23 Activity Comments  Stand pivot transfer, with RW w/c><mat, 1 rep;  then without RW 1 rep supervision  Sit to stand from 24" mat, 5 reps 2 sets, then from 26" mat, 5 reps -additional reps of sit<>stand in parallel bars, 5 reps, Hands on knees  Hands pushing from w/c  Standing in parallel bars:  R foot on 2" step for lateral weightshifting to increased weightshift to LLE, 10 reps -attempted RLE forward and propped on 2" step-forward/back weightshift x 3 reps      -increased pain RLE  -Forward gait in parallel bars, 8 ft x 6 reps RUE support and min guard  Seated hip/knee flexion RLE 10 reps   Standing LLE weightbearing with RLE hip flexion 2 x 8 reps   Forward/back walking in parallel bars 8 ft, 1 rep BUE support; c/o pain in low back after standing exercises  Hooklying hip abduction, green band, 3 x 15 reps   Hooklying hip adduction, ball squeeze, 3 x 15 reps            Access Code: [email or web address] URL: [email or web address] Date: 03/07/2023 Prepared by: [facility] - Outpatient  Rehab - [facility]  Exercises - Seated Long Arc Quad with Ankle Weight  - 1 x daily - 7 x weekly - 3 sets - 10 reps - Seated Hamstring Curl with Anchored Resistance  - 1 x daily - 7 x weekly -  3 sets - 10 reps - Seated Hip Abduction with Resistance  - 1 x daily - 7 x weekly - 3 sets - 10 reps - Supine Quad Set  - 1 x daily - 7 x weekly - 3 sets - 10 reps    -------------------------------------------------------------------------------------------------- Objective measures below taken at initial evaluation:  DIAGNOSTIC FINDINGS: CT lumbar spine 25-Dec-2022 IMPRESSION: 1. Pronounced chronic  osteopenia. Prior decompression and fusion throughout the lumbar spine.   2. L3 superior endplate/superior body non-acute fracture AND subtle associated horizontal fracture through the chronic posterior element bone mass there (series 9, image 53 today). But no displacement over this series of exams. 3. Difficult to exclude nondisplaced S3/S4 sacral fracture, with mild presacral stranding there.   4. But no other acute osseous abnormality identified. And elsewhere the chronic T12 through sacral ankylosis/arthrodesis appears intact.   5. T10 compression fracture is detailed separately today on CT Thoracic Spine.  COGNITION: Overall cognitive status: Within functional limits for tasks assessed   SENSATION: Light touch: WFL Reports numbness bottom of L foot, sometimes into lateral calf    POSTURE: rounded shoulders, weight shift right, and lateral lean towards R, L shoulder lower than R  LOWER EXTREMITY ROM:   WFL except decreased full Active R hip flexion  LOWER EXTREMITY MMT:    MMT Right Eval Left Eval  Hip flexion 3- 3  Hip extension    Hip abduction    Hip adduction    Hip internal rotation    Hip external rotation    Knee flexion 3+ 3+  Knee extension 3+ 4  Ankle dorsiflexion 3+ 3+  Ankle plantarflexion    Ankle inversion    Ankle eversion    (Blank rows = not tested)   TRANSFERS: Assistive device utilized: Environmental consultant - 2 wheeled  Sit to stand: CGA Stand to sit: CGA  GAIT: Gait pattern: step to pattern, step through pattern, decreased step length- Right, decreased step length- Left, Left hip hike, and knee flexed in stance- Left Distance walked: 35 ft Assistive device utilized: Walker - 2 wheeled Level of assistance: CGA Comments: stops x 1 due to fatigue  FUNCTIONAL TESTS:  10 meter walk test: attempted; unable to complete:  she ambulates 30 ft in 93.6 sec (0.32 ft/sec) Pt stands near walker, no UE support with supervision x 20-30 seconds  TODAY'S  TREATMENT:                                                                                                                              DATE:       PATIENT EDUCATION: Education details: Eval results, POC Person educated: Patient and Child(ren) Education method: Explanation Education comprehension: verbalized understanding  HOME EXERCISE PROGRAM: Access Code: [email or web address] URL: [email or web address] Date: 02/16/2023 Prepared by: Shary Decamp  Exercises - Seated Long Arc Quad with Ankle Weight  - 1 x daily - 7 x weekly - 3 sets - 10 reps - Seated Hamstring Curl  with Anchored Resistance  - 1 x daily - 7 x weekly - 3 sets - 10 reps - Seated Hip Abduction with Resistance  - 1 x daily - 7 x weekly - 3 sets - 10 reps  GOALS: Goals reviewed with patient? Yes  SHORT TERM GOALS: Target date: 03/11/2023  Pt will be independent with HEP for improved strength, balance, gait. Baseline: Goal status: MET, per report 03/14/2023  2.  Pt will perform 5x sit<>stand test in 5 seconds less than baseline measure, to demo improved functional strength. Baseline: 17 sec w/ BUE push-off>16.71 sec with UE support from chair Goal status: NOT YET MET, PROGRESSING  3.  Gait velocity to improve to 1 ft/sec for improved gait efficiency and safety. Baseline: 0.32 ft/sec>1.55 ft/sec with RW Goal status: MET 03/14/2023    LONG TERM GOALS: Target date: 04/08/2023  Pt will be independent with HEP for improved strength, balance, gait, decreased hip pain. Baseline:  Goal status: IN PROGRESS  2.  Pt will improve 5x sit<>stand to less than or equal to 12 sec to demonstrate improved functional strength and transfer efficiency. Baseline:  Goal status: IN PROGRESS  3.  Pt will improve Berg score by at least 10 points from baseline to decrease fall risk. Baseline: 33/56 Goal status: IN PROGRESS  4.  Gait velocity to improve to 1.8 ft/sec for improved gait efficiency and safety Baseline:  Goal status:  IN PROGRESS  5.  Pt will negotiate at least 3 steps with one handrail and min guard for improved negotiation into family's homes. Baseline:  Goal status: IN PROGRESS  6.  Pt will ambulate at least 500 ft with least restrictive device for improved community gait. Baseline:  Goal status:IN PROGRESS  ASSESSMENT:  CLINICAL IMPRESSION: Pt arrives today and reports she feels better balanced overall.  Worked initially on standing exercises to promote increased weightshift through LLE in standing.  With UE support, she is able to shift weight onto LLE with RLE in air or with RLE taps to block.  She reports outside of therapy, she is more readily shifting to her LLE, to lessen weightbearing primarily through RLE.  Pt is able to actively lift RLE in sitting for hip/knee flexion and she is able to actively perform R hip flexion in standing.  Worked also on dynamic standing/gait in parallel bars, with pt noting fatigue and some back pain after standing activities.  She continues to be motivated towards goals and continues to benefit from skilled PT to address strengthening, balance, and gait for improved functional mobility.    OBJECTIVE IMPAIRMENTS: Abnormal gait, decreased balance, decreased mobility, difficulty walking, decreased strength, impaired flexibility, postural dysfunction, and pain.   ACTIVITY LIMITATIONS: bending, standing, stairs, transfers, bathing, toileting, dressing, hygiene/grooming, and locomotion level  PARTICIPATION LIMITATIONS: driving, shopping, and community activity  PERSONAL FACTORS: 3+ comorbidities: See PMH  are also affecting patient's functional outcome.   REHAB POTENTIAL: Good  CLINICAL DECISION MAKING: Evolving/moderate complexity  EVALUATION COMPLEXITY: Moderate  PLAN:  PT FREQUENCY: 2x/week  PT DURATION: 8 weeks plus eval week  PLANNED INTERVENTIONS: Therapeutic exercises, Therapeutic activity, Neuromuscular re-education, Balance training, Gait training,  Patient/Family education, Self Care, Stair training, Orthotic/Fit training, and DME instructions  PLAN FOR NEXT SESSION: Standing balance-EO/EC (try at beginning of session).  Continue work on core stability (seated) and strengthening.  Continue to progress HEP for lower extremity strengthening. Consider standing weightshifting activities to increase LLE weightshift in standing   Lonia Blood, PT 03/22/23 1:26 PM Phone: [phone] Fax:  [phone]  [facility] at [facility] [address], [address] [place], [place] [address] Phone # [phone] Fax # [phone]

## 2023-03-24 ENCOUNTER — Encounter: Payer: Self-pay | Admitting: Physical Therapy

## 2023-03-24 ENCOUNTER — Ambulatory Visit: Payer: Medicare Other | Admitting: Physical Therapy

## 2023-03-24 DIAGNOSIS — R2689 Other abnormalities of gait and mobility: Secondary | ICD-10-CM

## 2023-03-24 DIAGNOSIS — M6281 Muscle weakness (generalized): Secondary | ICD-10-CM

## 2023-03-24 DIAGNOSIS — R2681 Unsteadiness on feet: Secondary | ICD-10-CM

## 2023-03-24 NOTE — Therapy (Signed)
OUTPATIENT PHYSICAL THERAPY NEURO TREATMENT   Patient Name: [name] MRN: [id number] DOB:May 17, 1956, 67 y.o., female Today's Date: 03/24/2023   PCP: Aliene Beams, MD REFERRING PROVIDER: Fanny Dance, MD    END OF SESSION:  PT End of Session - 03/24/23 1406     Visit Number 11    Number of Visits 17    Date for PT Re-Evaluation 04/08/23    Authorization Type [employer] Medicare    Progress Note Due on Visit --   (PN completed at visit 9)   PT Start Time 1404    PT Stop Time 1445    PT Time Calculation (min) 41 min    Equipment Utilized During Treatment Gait belt    Activity Tolerance Patient tolerated treatment well    Behavior During Therapy WFL for tasks assessed/performed                    Past Medical History:  Diagnosis Date   Anemia    Anxiety    Arthritis    Bipolar disorder (HCC)    Depression    GERD (gastroesophageal reflux disease)    Heart murmur    "related to VSD"   High cholesterol    History of blood transfusion    "related to OR" (08/19/2016)   History of hiatal hernia    Hypertension    Hyperthyroidism    Mild cognitive impairment 09/06/2018   Paroxysmal ventricular tachycardia (HCC)    Type II diabetes mellitus (HCC)    UTI (urinary tract infection)    being treated with Keflex   Ventricular septal defect    Ventriculitis of brain due to bacteria 11/02/2022   Vertebral fracture, osteoporotic (HCC) 01/06/2023   Past Surgical History:  Procedure Laterality Date   ABDOMINAL HYSTERECTOMY     BACK SURGERY     CARDIAC CATHETERIZATION N/A 06/23/2015   Procedure: Left Heart Cath and Coronary Angiography;  Surgeon: Laurey Morale, MD;  Location: [facility] INVASIVE CV LAB;  Service: Cardiovascular;  Laterality: N/A;   CARDIAC CATHETERIZATION  1960   "VSD was so small; didn't need repaired"   EXAM UNDER ANESTHESIA WITH MANIPULATION OF HIP Right 06/02/2014   dr Jerl Santos   FRACTURE SURGERY     HERNIA REPAIR     HIP CLOSED REDUCTION  Right 06/02/2014   Procedure: CLOSED MANIPULATION HIP;  Surgeon: Velna Ochs, MD;  Location: [facility] OR;  Service: Orthopedics;  Laterality: Right;   JOINT REPLACEMENT     JOINT REPLACEMENT     POSTERIOR LUMBAR FUSION 4 LEVEL N/A 06/26/2022   Procedure: Lumbar One To Lumbar Five Posterior Instrumented Fusion;  Surgeon: Jadene Pierini, MD;  Location: [facility] OR;  Service: Neurosurgery;  Laterality: N/A;   REFRACTIVE SURGERY Bilateral    SHOULDER ARTHROSCOPY Right    SHOULDER OPEN ROTATOR CUFF REPAIR Right    SPINAL FUSION  1996   "t10 down to my coccyx   SPINE HARDWARE REMOVAL     TOTAL ABDOMINAL HYSTERECTOMY     TOTAL HIP ARTHROPLASTY Right 05/10/2014   [email or web address]      by dr Cristal Deer olcott   TOTAL KNEE ARTHROPLASTY Left    TOTAL SHOULDER ARTHROPLASTY Left 08/19/2016   Procedure: TOTAL SHOULDER ARTHROPLASTY;  Surgeon: Jones Broom, MD;  Location: [facility] OR;  Service: Orthopedics;  Laterality: Left;  Left total shoulder replacement   Patient Active Problem List   Diagnosis Date Noted   Nonobstructive atherosclerosis of coronary artery 02/10/2023   Vertebral fracture, osteoporotic (HCC)  01/06/2023   Ventriculitis of brain due to bacteria 11/02/2022   Anemia of chronic illness 10/12/2022   Bipolar disorder, in partial remission, most recent episode depressed (HCC) 10/07/2022   Adjustment disorder with mixed anxiety and depressed mood 09/30/2022   Chronic wound 09/25/2022   Hyperglycemia 09/25/2022   Chronic midline thoracic back pain 09/17/2022   Primary insomnia 09/17/2022   Chronic combined systolic and diastolic CHF (congestive heart failure) (HCC) 09/14/2022   Acute blood loss anemia 09/09/2022   Infection of lumbar spine (HCC) 09/03/2022   Malnutrition of moderate degree 08/20/2022   Hardware complicating wound infection (HCC) 08/20/2022   Infection of deep incisional surgical site after procedure 08/20/2022   Aspiration pneumonia of both lungs (HCC) 08/19/2022   Acute  respiratory failure with hypoxia (HCC) 08/19/2022   Sepsis (HCC) 08/19/2022   Seizure (HCC) 08/19/2022   Wound dehiscence 08/09/2022   Delayed surgical wound healing 07/08/2022   Fracture of lumbar spine without cord injury (HCC) 06/26/2022   Lumbar vertebral fracture (HCC) 06/25/2022   Mood disorder (HCC) 09/29/2021   Mild cognitive impairment 09/06/2018   Hyponatremia 02/28/2018   S/P shoulder replacement, left 08/19/2016   Anxiety 10/17/2014   Acid reflux 10/17/2014   BP (high blood pressure) 10/17/2014   Arthritis, degenerative 10/17/2014   Adult hypothyroidism 10/17/2014   UTI (urinary tract infection) 06/03/2014   Fracture of bone adjacent to prosthesis 06/03/2014   Diabetes (HCC) 06/02/2014   Peri-prosthetic fracture of femur following total hip arthroplasty 06/02/2014   Urinary retention 05/13/2014   Chronic pain 05/10/2014   History of hip surgery 05/10/2014   UNSPECIFIED HEART FAILURE 06/24/2010   UNSPECIFIED CONGENITAL DEFECT OF SEPTAL CLOSURE 06/24/2010   TOBACCO ABUSE 03/18/2010   Secondary cardiomyopathy (HCC) 03/18/2010   DM 06/13/2009   HYPERTENSION, UNSPECIFIED 06/13/2009   VENTRICULAR TACHYCARDIA 06/13/2009   VENTRICULAR SEPTAL DEFECT, CONGENITAL 06/13/2009   Diabetes mellitus, type 2 (HCC) 06/13/2009   Essential (primary) hypertension 06/13/2009    ONSET DATE: 01/30/2023 (MD referral)  REFERRING DIAG: M46.26 (ICD-10-CM) - Infection of lumbar spine (HCC)   THERAPY DIAG:  Muscle weakness (generalized)  Unsteadiness on feet  Other abnormalities of gait and mobility  Rationale for Evaluation and Treatment: Rehabilitation  SUBJECTIVE:                                                                                                                                                                                             SUBJECTIVE STATEMENT: Didn't have too much pain in my back after last visit.  Just had some "physical therapy-type" muscle pain in R  hip, but not bad.  Pt accompanied by: self  PERTINENT HISTORY: Hx of chronic wound (thoracic spine area) having had a postoperative surgery complicated by wound infection with CSF leak and ventriculitis with concern for hardware potentially being involved; osteomyelitis; spinal compression fracture (T10)   67 y.o. female with hx of scoliosis surgery with extensive lower thoracic and lumbar fusion roughly 14yrs ago, she suffered ground level fall in late November, where she fractures through all three columns of L3, thus went to OR on 12/9 for stabilization, new  posterolateral instrumentation fusion on June 26, 2022 complicated by Wound dehiscence with exposed hardware and unstable lumbar spine fracture status post revision of lumbar wound with removal of L1-3 4 and 5 lumbar screws and rod and removal of right L1 screw and revision of L1-L5 posterior instrumented fusion on August 09, 2022 and now concern for postoperative wound infection and ventriculitis. She had complicated protracted course in the icu, but eventually was discharged to CIR on vancomycin and cefepime through 3/14 for ventriculitis and hw complicating wound infection.    Mri of brain on 09/29/22- resolution of ventriculitis.  PAIN:  Are you having pain? Yes: NPRS scale: 3/10 Pain location: lower R flank/low back around fusion site  Pain description: throbbing, achy Aggravating factors: longer car rides  Relieving factors: sitting  PRECAUTIONS: Back, Fall, and Other: hx of osteoporosis and fractures ; avoid twisting; avoid excess bending; is to log roll for bed mobility; (thoracic) wound being followed by wound care.  RED FLAGS: None   WEIGHT BEARING RESTRICTIONS: No  FALLS: Has patient fallen in last 6 months? Yes. Number of falls 1  LIVING ENVIRONMENT: Lives with: lives with their spouse; has caregiver at home; ramp to enter; one level home Lives in: House/apartment Stairs: Yes: Internal: 1 steps; none; family's  homes have steps to get in Has following equipment at home: Dan Humphreys - 2 wheeled, Wheelchair (manual), Shower bench, and bed side commode *has used shoe with lift in the distant past for Leg length discrepancy PLOF: Independent using cane  PATIENT GOALS: Wants to work on balance, walking, stairs, to get in and out of a car betterto be independent again  OBJECTIVE:    TODAY'S TREATMENT: 03/24/2023 Activity Comments  Standing EO, then EC 30 sec wide BOS, x 3 reps EO head turns/nods Min guard/close supervision and  mild sway; most weight in standing on RLE  Semitandem stance with EO head turns/nods EC head steady 15 sec Min guard  Sidestep in parallel bars R and L BUE>1 UE/intermittent support  LLE as SLS with RLE propped at 4" step with hands at sides, EC, 3 x 10 sec Min guard  Sit<>stand 5 reps, 3 sets, from 23>21>19 inch height Hands at knees  Gait x 170 ft with RW, 1 standing rest break supervision        Access Code: [email or web address] URL: [email or web address] Date: 03/07/2023 Prepared by: [facility] - Outpatient  Rehab - [facility]  Exercises - Seated Long Arc Quad with Ankle Weight  - 1 x daily - 7 x weekly - 3 sets - 10 reps - Seated Hamstring Curl with Anchored Resistance  - 1 x daily - 7 x weekly - 3 sets - 10 reps - Seated Hip Abduction with Resistance  - 1 x daily - 7 x weekly - 3 sets - 10 reps - Supine Quad Set  - 1 x daily - 7 x weekly - 3 sets - 10 reps   PATIENT EDUCATION: Education details: Brief discussion of follow-up with  orthotist about shoe lift for leg length discrepancy Person educated: Patient Education method: Explanation Education comprehension: verbalized understanding  -------------------------------------------------------------------------------------------------- Objective measures below taken at initial evaluation:  DIAGNOSTIC FINDINGS: CT lumbar spine 12/04/22 IMPRESSION: 1. Pronounced chronic osteopenia. Prior decompression and  fusion throughout the lumbar spine.   2. L3 superior endplate/superior body non-acute fracture AND subtle associated horizontal fracture through the chronic posterior element bone mass there (series 9, image 53 today). But no displacement over this series of exams. 3. Difficult to exclude nondisplaced S3/S4 sacral fracture, with mild presacral stranding there.   4. But no other acute osseous abnormality identified. And elsewhere the chronic T12 through sacral ankylosis/arthrodesis appears intact.   5. T10 compression fracture is detailed separately today on CT Thoracic Spine.  COGNITION: Overall cognitive status: Within functional limits for tasks assessed   SENSATION: Light touch: WFL Reports numbness bottom of L foot, sometimes into lateral calf    POSTURE: rounded shoulders, weight shift right, and lateral lean towards R, L shoulder lower than R  LOWER EXTREMITY ROM:   WFL except decreased full Active R hip flexion  LOWER EXTREMITY MMT:    MMT Right Eval Left Eval  Hip flexion 3- 3  Hip extension    Hip abduction    Hip adduction    Hip internal rotation    Hip external rotation    Knee flexion 3+ 3+  Knee extension 3+ 4  Ankle dorsiflexion 3+ 3+  Ankle plantarflexion    Ankle inversion    Ankle eversion    (Blank rows = not tested)   TRANSFERS: Assistive device utilized: Environmental consultant - 2 wheeled  Sit to stand: CGA Stand to sit: CGA  GAIT: Gait pattern: step to pattern, step through pattern, decreased step length- Right, decreased step length- Left, Left hip hike, and knee flexed in stance- Left Distance walked: 35 ft Assistive device utilized: Walker - 2 wheeled Level of assistance: CGA Comments: stops x 1 due to fatigue  FUNCTIONAL TESTS:  10 meter walk test: attempted; unable to complete:  she ambulates 30 ft in 93.6 sec (0.32 ft/sec) Pt stands near walker, no UE support with supervision x 20-30 seconds  TODAY'S TREATMENT:                                                                                                                               DATE:       PATIENT EDUCATION: Education details: Eval results, POC Person educated: Patient and Child(ren) Education method: Explanation Education comprehension: verbalized understanding  HOME EXERCISE PROGRAM: Access Code: [email or web address] URL: [email or web address] Date: 02/16/2023 Prepared by: Shary Decamp  Exercises - Seated Long Arc Quad with Ankle Weight  - 1 x daily - 7 x weekly - 3 sets - 10 reps - Seated Hamstring Curl with Anchored Resistance  - 1 x daily - 7 x weekly - 3 sets - 10 reps - Seated Hip Abduction with Resistance  - 1 x daily -  7 x weekly - 3 sets - 10 reps  GOALS: Goals reviewed with patient? Yes  SHORT TERM GOALS: Target date: 03/11/2023  Pt will be independent with HEP for improved strength, balance, gait. Baseline: Goal status: MET, per report 03/14/2023  2.  Pt will perform 5x sit<>stand test in 5 seconds less than baseline measure, to demo improved functional strength. Baseline: 17 sec w/ BUE push-off>16.71 sec with UE support from chair Goal status: NOT YET MET, PROGRESSING  3.  Gait velocity to improve to 1 ft/sec for improved gait efficiency and safety. Baseline: 0.32 ft/sec>1.55 ft/sec with RW Goal status: MET 03/14/2023    LONG TERM GOALS: Target date: 04/08/2023  Pt will be independent with HEP for improved strength, balance, gait, decreased hip pain. Baseline:  Goal status: IN PROGRESS  2.  Pt will improve 5x sit<>stand to less than or equal to 12 sec to demonstrate improved functional strength and transfer efficiency. Baseline:  Goal status: IN PROGRESS  3.  Pt will improve Berg score by at least 10 points from baseline to decrease fall risk. Baseline: 33/56 Goal status: IN PROGRESS  4.  Gait velocity to improve to 1.8 ft/sec for improved gait efficiency and safety Baseline:  Goal status: IN PROGRESS  5.  Pt will negotiate at  least 3 steps with one handrail and min guard for improved negotiation into family's homes. Baseline:  Goal status: IN PROGRESS  6.  Pt will ambulate at least 500 ft with least restrictive device for improved community gait. Baseline:  Goal status:IN PROGRESS  ASSESSMENT:  CLINICAL IMPRESSION: Skilled PT session today focused on standing balance with eyes open and head motions as well as eyes closed, both in varied foot positions.  Pt has increased weight on RLE with standing exercises, due to LLD; however, she does shift weight towards LLE with SLS activities (needs definite UE support for this).  With LLE stance activities, LLE fatigues, but she is able to tolerate well.  Worked also on functional strengthening with sit<>stand from varied heights, hands at knees, and pt able to perform in this fashion to 19" seat height (compared to needing to push off through arms of chair at initial eval).  She does not have increased c/o pain in low back at end of session today.  She will continue to benefit from skilled PT to continue to work towards goals for improved strength, balance, functional mobility.   OBJECTIVE IMPAIRMENTS: Abnormal gait, decreased balance, decreased mobility, difficulty walking, decreased strength, impaired flexibility, postural dysfunction, and pain.   ACTIVITY LIMITATIONS: bending, standing, stairs, transfers, bathing, toileting, dressing, hygiene/grooming, and locomotion level  PARTICIPATION LIMITATIONS: driving, shopping, and community activity  PERSONAL FACTORS: 3+ comorbidities: See PMH  are also affecting patient's functional outcome.   REHAB POTENTIAL: Good  CLINICAL DECISION MAKING: Evolving/moderate complexity  EVALUATION COMPLEXITY: Moderate  PLAN:  PT FREQUENCY: 2x/week  PT DURATION: 8 weeks plus eval week  PLANNED INTERVENTIONS: Therapeutic exercises, Therapeutic activity, Neuromuscular re-education, Balance training, Gait training, Patient/Family  education, Self Care, Stair training, Orthotic/Fit training, and DME instructions  PLAN FOR NEXT SESSION: Standing balance-EO/EC (try at beginning of session).  Continue work on core stability (seated) and strengthening.  Continue to progress HEP for lower extremity strengthening. Continue standing weightshifting activities to increase LLE weightshift in standing.  Work on Journalist, newspaper outdoor gait.  *PT to follow up with orthotist about possible shoe lift    Lonia Blood, PT 03/24/23 5:16 PM Phone: [phone] Fax: [phone]  [place]  [facility] at [facility] [address] [place], [place] [address] Phone # [phone] Fax # [phone]

## 2023-03-28 ENCOUNTER — Telehealth: Payer: Self-pay | Admitting: Neurology

## 2023-03-28 NOTE — Telephone Encounter (Signed)
REQUIRED PHONE NOTE: pt checked to see if she had an appointment

## 2023-03-29 ENCOUNTER — Encounter: Payer: Self-pay | Admitting: Physical Therapy

## 2023-03-29 ENCOUNTER — Ambulatory Visit: Payer: Medicare Other | Admitting: Occupational Therapy

## 2023-03-29 ENCOUNTER — Ambulatory Visit: Payer: Medicare Other | Admitting: Physical Therapy

## 2023-03-29 DIAGNOSIS — M6281 Muscle weakness (generalized): Secondary | ICD-10-CM | POA: Diagnosis not present

## 2023-03-29 DIAGNOSIS — R2681 Unsteadiness on feet: Secondary | ICD-10-CM

## 2023-03-29 DIAGNOSIS — R2689 Other abnormalities of gait and mobility: Secondary | ICD-10-CM

## 2023-03-29 NOTE — Therapy (Signed)
OUTPATIENT OCCUPATIONAL THERAPY NEURO  Treatment Note  Patient Name: [name] MRN: [id number] DOB:April 12, 1956, 67 y.o., female Today's Date: 03/29/2023  PCP: Aliene Beams, MD REFERRING PROVIDER: Fanny Dance, MD  END OF SESSION:  OT End of Session - 03/29/23 1600     Visit Number 2    Number of Visits 17    Date for OT Re-Evaluation 05/13/23    Authorization Type [employer] Medicare    OT Start Time 1452    OT Stop Time 1537    OT Time Calculation (min) 45 min              Past Medical History:  Diagnosis Date   Anemia    Anxiety    Arthritis    Bipolar disorder (HCC)    Depression    GERD (gastroesophageal reflux disease)    Heart murmur    "related to VSD"   High cholesterol    History of blood transfusion    "related to OR" (08/19/2016)   History of hiatal hernia    Hypertension    Hyperthyroidism    Mild cognitive impairment 09/06/2018   Paroxysmal ventricular tachycardia (HCC)    Type II diabetes mellitus (HCC)    UTI (urinary tract infection)    being treated with Keflex   Ventricular septal defect    Ventriculitis of brain due to bacteria 11/02/2022   Vertebral fracture, osteoporotic (HCC) 01/06/2023   Past Surgical History:  Procedure Laterality Date   ABDOMINAL HYSTERECTOMY     BACK SURGERY     CARDIAC CATHETERIZATION N/A 06/23/2015   Procedure: Left Heart Cath and Coronary Angiography;  Surgeon: Laurey Morale, MD;  [facility];  Service: Cardiovascular;  Laterality: N/A;   CARDIAC CATHETERIZATION  1960   "VSD was so small; didn't need repaired"   EXAM UNDER ANESTHESIA WITH MANIPULATION OF HIP Right 06/02/2014   dr Jerl Santos   FRACTURE SURGERY     HERNIA REPAIR     HIP CLOSED REDUCTION Right 06/02/2014   Procedure: CLOSED MANIPULATION HIP;  Surgeon: Velna Ochs, MD;  Location: [facility] OR;  Service: Orthopedics;  Laterality: Right;   JOINT REPLACEMENT     JOINT REPLACEMENT     POSTERIOR LUMBAR FUSION 4 LEVEL N/A  06/26/2022   Procedure: Lumbar One To Lumbar Five Posterior Instrumented Fusion;  Surgeon: Jadene Pierini, MD;  Location: [facility] OR;  Service: Neurosurgery;  Laterality: N/A;   REFRACTIVE SURGERY Bilateral    SHOULDER ARTHROSCOPY Right    SHOULDER OPEN ROTATOR CUFF REPAIR Right    SPINAL FUSION  1996   "t10 down to my coccyx   SPINE HARDWARE REMOVAL     TOTAL ABDOMINAL HYSTERECTOMY     TOTAL HIP ARTHROPLASTY Right 05/10/2014   [email or web address]      by dr Cristal Deer olcott   TOTAL KNEE ARTHROPLASTY Left    TOTAL SHOULDER ARTHROPLASTY Left 08/19/2016   Procedure: TOTAL SHOULDER ARTHROPLASTY;  Surgeon: Jones Broom, MD;  Location: [facility] OR;  Service: Orthopedics;  Laterality: Left;  Left total shoulder replacement   Patient Active Problem List   Diagnosis Date Noted   Nonobstructive atherosclerosis of coronary artery 02/10/2023   Vertebral fracture, osteoporotic (HCC) 01/06/2023   Ventriculitis of brain due to bacteria 11/02/2022   Anemia of chronic illness 10/12/2022   Bipolar disorder, in partial remission, most recent episode depressed (HCC) 10/07/2022   Adjustment disorder with mixed anxiety and depressed mood 09/30/2022   Chronic wound 09/25/2022   Hyperglycemia 09/25/2022  Chronic midline thoracic back pain 09/17/2022   Primary insomnia 09/17/2022   Chronic combined systolic and diastolic CHF (congestive heart failure) (HCC) 09/14/2022   Acute blood loss anemia 09/09/2022   Infection of lumbar spine (HCC) 09/03/2022   Malnutrition of moderate degree 08/20/2022   Hardware complicating wound infection (HCC) 08/20/2022   Infection of deep incisional surgical site after procedure 08/20/2022   Aspiration pneumonia of both lungs (HCC) 08/19/2022   Acute respiratory failure with hypoxia (HCC) 08/19/2022   Sepsis (HCC) 08/19/2022   Seizure (HCC) 08/19/2022   Wound dehiscence 08/09/2022   Delayed surgical wound healing 07/08/2022   Fracture of lumbar spine without cord injury (HCC)  06/26/2022   Lumbar vertebral fracture (HCC) 06/25/2022   Mood disorder (HCC) 09/29/2021   Mild cognitive impairment 09/06/2018   Hyponatremia 02/28/2018   S/P shoulder replacement, left 08/19/2016   Anxiety 10/17/2014   Acid reflux 10/17/2014   BP (high blood pressure) 10/17/2014   Arthritis, degenerative 10/17/2014   Adult hypothyroidism 10/17/2014   UTI (urinary tract infection) 06/03/2014   Fracture of bone adjacent to prosthesis 06/03/2014   Diabetes (HCC) 06/02/2014   Peri-prosthetic fracture of femur following total hip arthroplasty 06/02/2014   Urinary retention 05/13/2014   Chronic pain 05/10/2014   History of hip surgery 05/10/2014   UNSPECIFIED HEART FAILURE 06/24/2010   UNSPECIFIED CONGENITAL DEFECT OF SEPTAL CLOSURE 06/24/2010   TOBACCO ABUSE 03/18/2010   Secondary cardiomyopathy (HCC) 03/18/2010   DM 06/13/2009   HYPERTENSION, UNSPECIFIED 06/13/2009   VENTRICULAR TACHYCARDIA 06/13/2009   VENTRICULAR SEPTAL DEFECT, CONGENITAL 06/13/2009   Diabetes mellitus, type 2 (HCC) 06/13/2009   Essential (primary) hypertension 06/13/2009    ONSET DATE: 08/09/22 (referral 02/24/23)  REFERRING DIAG: M62.81 (ICD-10-CM) - Muscle weakness (generalized) MD requesting plan to address upper body strenghtening and full ADL participation  THERAPY DIAG:  Muscle weakness (generalized)  Other abnormalities of gait and mobility  Unsteadiness on feet  Rationale for Evaluation and Treatment: Rehabilitation  SUBJECTIVE:   SUBJECTIVE STATEMENT: Pt reports trying heat and ice as well as motrin and tylenol for her pain, but reports that she did not sleep well overnight last night due to extreme pain.   Pt accompanied by: self and PCA (who dropped her off at appt)  PERTINENT HISTORY: 67 year old female with history of PVT, T2DM, small membranous VSD, depression, bipolar d/o, mild cognitive impairment,, fall 05/2022 with 3 column unstable fracture through prior scoliosis construct s/p  instrumentation with exposed hardware and wound dehiscence. She was admitted on 08/09/2022 for revision of complex lumbar wound, removal of L1-L5 lumbar screws and rods with revision L1-L5 posterior fusion by Dr. Johnsie Cancel.    Postop had issues with fatigue cough, hypotension as well as confusion overnight on 01/24. BLE Dopplers ordered for follow-up on 01/22 and was negative for DVT and showed hypoechoic collection in left popliteal fossa.  Foley placed due to ongoing issues with urinary retention.  Due to ongoing confusion and lethargy, antibiotics added due to concerns of UTI and oxycodone dose decreased due to concerns of side effects.  She started developing drainage from her wound on 01/28 as well as watery diarrhea and stool was negative for C. difficile. [name] consulted due to worsening of respiratory status due to sepsis from acute meningitis and asked patient pneumonia and she required intubation on 02/01.  Neurology consulted due to concerns of seizures and EEG showed moderate diffuse encephalopathy.  MRI brain done showing punctate foci of acute ischemia right frontal lobe and diffusion  abnormality within the occipital horn of left lateral ventricle with proteinaceous/purulent debris.  MRI spine showed L2-L5 fusion widely patent with decrease in bone marrow edema L3.  PRECAUTIONS: Back and Fall (no twisting, minimal bending)  and Other: hx of osteoporosis and fractures ; avoid twisting; avoid excess bending; is to log roll for bed mobility; (thoracic) wound being followed by wound care.   WEIGHT BEARING RESTRICTIONS: No  PAIN:  Are you having pain? Yes: NPRS scale: 5/10 Pain location: lower back, around fusion site Pain description: aching Aggravating factors: certain movements Relieving factors: flexeril  FALLS: Has patient fallen in last 6 months? Yes. Number of falls 1, fell the day after she got home.    LIVING ENVIRONMENT: Lives with: lives with their spouse, PCA 8:30- 4 Mon-  Fri Lives in: House/apartment Stairs: Yes: External: 1 steps; caregiver/family bumping pt up the platform step Has following equipment at home: Dan Humphreys - 2 wheeled, Environmental consultant - 4 wheeled, Wheelchair (manual), Shower bench, bed side commode, and Grab bars  PLOF: Independent and Independent with basic ADLs  PATIENT GOALS: "I want to be able to [place]"  OBJECTIVE:   HAND DOMINANCE: Right  ADLs: Transfers/ambulation related to ADLs: CGA - Supervision with RW, stand pivot transfers with and without RW with close supervision Grooming: Mod I, washing face standing and then sitting to put on makeup UB Dressing: Setup/supervision LB Dressing: needs assist with shoes and pants with RLE Toileting: BSC with handles, Supervision for clothing management and hygiene at sit > stand level Bathing: uses walk-in shower in master bathroom, bathing at sit > stand level Tub Shower transfers: close supervision, stabilizing RW when stepping over walk-in shower ledge Equipment: Grab bars, Walk in shower, bed side commode, and built in shower seat, walk-in shower  IADLs: Meal Prep: has cooked a chicken dish and baked spaghetti, standing to retreive items but then sitting for majority of meal prep.  Does have concerns with retrieving items from upper and lower cabinets, refrigerator.  Difficulty with opening jars/containers Medication management: daughter fills up pill box and pt/husband/caregivers assisting as needed  MOBILITY STATUS: Needs Assist: min guard - supervision ambulatory transfers, Supervision - Mod I stand pivot from w/c  POSTURE COMMENTS:  rounded shoulders, weight shift right, and lateral lean towards R, L shoulder lower than R   ACTIVITY TOLERANCE: Activity tolerance: WFL for tasks assessed on eval  UPPER EXTREMITY ROM:  shoulder flexion with compensatory abduction but able to reach [facility], pt also with full ROM with internal/external rotation  UPPER EXTREMITY  MMT:   grossly 3+ to 4/5 bilaterally   HAND FUNCTION: Grip strength: Right: 22 lbs; Left: 30 lbs and Tip pinch: Right 8 lbs, Left: 7 lbs  COORDINATION: 9 Hole Peg test: Right: 21.97 sec; Left: 25.69 sec  SENSATION: R foot is still numb on the bottom, BUE sensation intact  COGNITION: Overall cognitive status: Within functional limits for tasks assessed   TODAY'S TREATMENT:                                                                                          DATE:  03/29/23 Sleeping positions: Pt reports not able to sleep last night due to increased pain after doing some meal prep.  Pt did report good use of energy conservation strategies with sitting during cutting items, but did do some standing with UE pressure through arms on RW.  Discussed pillow placement and bent knees for additional offloading of pressure and lumbar spine, pt plans to attempt further repositioning tonight. LB dressing: educated on variety of sitting postures and positioning to increase ease and independence with LB dressing.  Pt with difficulty lifting RLE, however able to place B feet onto 4" step.  Pt then demonstrating increased ease with reaching towards feet as needed for donning and tying shoes.  Discussed options to recreate position at home. Marissa strengthening: OT instructed pt in various grip and pinch exercises as well as thumb flexion and extension.  Pt continue to report difficulty with strength and coordination with R hand during functional tasks. Exercises - Putty Squeezes  - Tip Pinch with Putty  - 3-Point Pinch with Putty   - Key Pinch with Putty   - Thumb MCP and IP Flexion with Putty   - Thumb Radial Abduction with Putty Loop     03/14/23 Eval only  PATIENT EDUCATION: Education details: ongoing condition specific education and modification to increase ease with LB dressing Person educated: Patient Education method: Explanation Education comprehension: verbalized understanding and needs  further education  HOME EXERCISE PROGRAM: Access Code: [email or web address] URL: [email or web address] Date: 03/29/2023 Prepared by: [facility] - [facility]  Exercises - Putty Squeezes  - 1 x daily - 1 sets - 10 reps - Tip Pinch with Putty  - 1 x daily - 1 sets - 10 reps - 3-Point Pinch with Putty  - 1 x daily - 1 sets - 10 reps - Key Pinch with Putty  - 1 x daily - 1 sets - 10 reps - Thumb MCP and IP Flexion with Putty  - 1 x daily - 1 sets - 10 reps - Thumb Radial Abduction with Putty Loop  - 1 x daily - 7 x weekly - 1 sets - 10 reps   GOALS: Goals reviewed with patient? Yes  SHORT TERM GOALS: Target date: 04/15/23  Pt will be independent in BUE strengthening HEP. Baseline: Goal status: IN PROGRESS  2.  Pt will verbalize understanding of task modifications and/or potential AE needs to increase ease, safety, and independence w/ ADLs. Baseline:  Goal status: IN PROGRESS  3.  Pt will demonstrate improved grip strength by 4# with Marissa to increase success with opening food containers. Baseline: R:22# and L: 30# Goal status: IN PROGRESS    LONG TERM GOALS: Target date: 05/13/23  Pt will be able to complete LB dressing at Mod I level utilizing compensatory strategies and/or AE as needed. Baseline:  Goal status: IN PROGRESS  2.  Pt will demonstrate improved ease with stand pivot and short distance ambulatory transfers to access bathroom toilet and in/out of car at Mod I level. Baseline:  Goal status: IN PROGRESS  3.  Pt will verbalize and/or demonstrate safe techniques to retreive items from floor when dropped. Baseline:  Goal status: IN PROGRESS  4.  Pt will demonstrate improved balance and endurance to maintain dynamic standing balance for 10 mins to complete ADLs and/or IADLs without needing seated rest break. Baseline:  Goal status: IN PROGRESS  5.  Pt will be able to demonstrate improved standing balance and positioning of RW as need to  retreive items from upper and lower cabinets and refrigerator at Mod I level. Baseline:  Goal status: IN PROGRESS  6.  Pt will demonstrate improved UE and grip strength as needed to engage in functional meal prep tasks (open containers and sustain grasp on items during meal prep). Baseline:  Goal status: IN PROGRESS  ASSESSMENT:  CLINICAL IMPRESSION: Pt with good participation despite not sleeping well overnight due to increased back pain.  Pt receptive to recommendations for modified sleep posture as well as modifications to sitting position/BLE position during LB dressing.  Pt reports plan to attempt these at home before next session.  Pt with good understanding of strengthening exercises for R hand.  PERFORMANCE DEFICITS: in functional skills including ADLs, IADLs, ROM, strength, pain, Gross motor control, balance, body mechanics, endurance, decreased knowledge of precautions, decreased knowledge of use of DME, and UE functional use and psychosocial skills including coping strategies and environmental adaptation.     PLAN:  OT FREQUENCY: 2x/week  OT DURATION: 8 weeks  PLANNED INTERVENTIONS: self care/ADL training, therapeutic exercise, therapeutic activity, neuromuscular re-education, balance training, functional mobility training, moist heat, cryotherapy, patient/family education, psychosocial skills training, energy conservation, coping strategies training, and DME and/or AE instructions  RECOMMENDED OTHER SERVICES: NA  CONSULTED AND AGREED WITH PLAN OF CARE: Patient  PLAN FOR NEXT SESSION: Complete functional assessment Myrtis Ser and/or Golda Acre or other appropriate assessment).  Transfers and dynamic standing/reaching.  Modifications for LB dressing,    Demetri Goshert, OTR/L 03/29/2023, 4:01 PM

## 2023-03-29 NOTE — Therapy (Signed)
OUTPATIENT PHYSICAL THERAPY NEURO TREATMENT   Patient Name: [name] MRN: [id number] DOB:11-Feb-1956, 67 y.o., female Today's Date: 03/29/2023   PCP: Aliene Beams, MD REFERRING PROVIDER: Fanny Dance, MD    END OF SESSION:  PT End of Session - 03/29/23 1404     Visit Number 12    Number of Visits 17    Date for PT Re-Evaluation 04/08/23    Authorization Type [employer] Medicare    Progress Note Due on Visit --   (PN completed at visit 9)   PT Start Time 1402    PT Stop Time 1447    PT Time Calculation (min) 45 min    Equipment Utilized During Treatment Gait belt    Activity Tolerance Patient tolerated treatment well    Behavior During Therapy WFL for tasks assessed/performed                     Past Medical History:  Diagnosis Date   Anemia    Anxiety    Arthritis    Bipolar disorder (HCC)    Depression    GERD (gastroesophageal reflux disease)    Heart murmur    "related to VSD"   High cholesterol    History of blood transfusion    "related to OR" (08/19/2016)   History of hiatal hernia    Hypertension    Hyperthyroidism    Mild cognitive impairment 09/06/2018   Paroxysmal ventricular tachycardia (HCC)    Type II diabetes mellitus (HCC)    UTI (urinary tract infection)    being treated with Keflex   Ventricular septal defect    Ventriculitis of brain due to bacteria 11/02/2022   Vertebral fracture, osteoporotic (HCC) 01/06/2023   Past Surgical History:  Procedure Laterality Date   ABDOMINAL HYSTERECTOMY     BACK SURGERY     CARDIAC CATHETERIZATION N/A 06/23/2015   Procedure: Left Heart Cath and Coronary Angiography;  Surgeon: Laurey Morale, MD;  Location: [facility] INVASIVE CV LAB;  Service: Cardiovascular;  Laterality: N/A;   CARDIAC CATHETERIZATION  1960   "VSD was so small; didn't need repaired"   EXAM UNDER ANESTHESIA WITH MANIPULATION OF HIP Right 06/02/2014   dr Jerl Santos   FRACTURE SURGERY     HERNIA REPAIR     HIP CLOSED  REDUCTION Right 06/02/2014   Procedure: CLOSED MANIPULATION HIP;  Surgeon: Velna Ochs, MD;  Location: [facility] OR;  Service: Orthopedics;  Laterality: Right;   JOINT REPLACEMENT     JOINT REPLACEMENT     POSTERIOR LUMBAR FUSION 4 LEVEL N/A 06/26/2022   Procedure: Lumbar One To Lumbar Five Posterior Instrumented Fusion;  Surgeon: Jadene Pierini, MD;  Location: [facility] OR;  Service: Neurosurgery;  Laterality: N/A;   REFRACTIVE SURGERY Bilateral    SHOULDER ARTHROSCOPY Right    SHOULDER OPEN ROTATOR CUFF REPAIR Right    SPINAL FUSION  1996   "t10 down to my coccyx   SPINE HARDWARE REMOVAL     TOTAL ABDOMINAL HYSTERECTOMY     TOTAL HIP ARTHROPLASTY Right 05/10/2014   [email or web address]      by dr Cristal Deer olcott   TOTAL KNEE ARTHROPLASTY Left    TOTAL SHOULDER ARTHROPLASTY Left 08/19/2016   Procedure: TOTAL SHOULDER ARTHROPLASTY;  Surgeon: Jones Broom, MD;  Location: [facility] OR;  Service: Orthopedics;  Laterality: Left;  Left total shoulder replacement   Patient Active Problem List   Diagnosis Date Noted   Nonobstructive atherosclerosis of coronary artery 02/10/2023   Vertebral fracture, osteoporotic (  HCC) 01/06/2023   Ventriculitis of brain due to bacteria 11/02/2022   Anemia of chronic illness 10/12/2022   Bipolar disorder, in partial remission, most recent episode depressed (HCC) 10/07/2022   Adjustment disorder with mixed anxiety and depressed mood 09/30/2022   Chronic wound 09/25/2022   Hyperglycemia 09/25/2022   Chronic midline thoracic back pain 09/17/2022   Primary insomnia 09/17/2022   Chronic combined systolic and diastolic CHF (congestive heart failure) (HCC) 09/14/2022   Acute blood loss anemia 09/09/2022   Infection of lumbar spine (HCC) 09/03/2022   Malnutrition of moderate degree 08/20/2022   Hardware complicating wound infection (HCC) 08/20/2022   Infection of deep incisional surgical site after procedure 08/20/2022   Aspiration pneumonia of both lungs (HCC) 08/19/2022    Acute respiratory failure with hypoxia (HCC) 08/19/2022   Sepsis (HCC) 08/19/2022   Seizure (HCC) 08/19/2022   Wound dehiscence 08/09/2022   Delayed surgical wound healing 07/08/2022   Fracture of lumbar spine without cord injury (HCC) 06/26/2022   Lumbar vertebral fracture (HCC) 06/25/2022   Mood disorder (HCC) 09/29/2021   Mild cognitive impairment 09/06/2018   Hyponatremia 02/28/2018   S/P shoulder replacement, left 08/19/2016   Anxiety 10/17/2014   Acid reflux 10/17/2014   BP (high blood pressure) 10/17/2014   Arthritis, degenerative 10/17/2014   Adult hypothyroidism 10/17/2014   UTI (urinary tract infection) 06/03/2014   Fracture of bone adjacent to prosthesis 06/03/2014   Diabetes (HCC) 06/02/2014   Peri-prosthetic fracture of femur following total hip arthroplasty 06/02/2014   Urinary retention 05/13/2014   Chronic pain 05/10/2014   History of hip surgery 05/10/2014   UNSPECIFIED HEART FAILURE 06/24/2010   UNSPECIFIED CONGENITAL DEFECT OF SEPTAL CLOSURE 06/24/2010   TOBACCO ABUSE 03/18/2010   Secondary cardiomyopathy (HCC) 03/18/2010   DM 06/13/2009   HYPERTENSION, UNSPECIFIED 06/13/2009   VENTRICULAR TACHYCARDIA 06/13/2009   VENTRICULAR SEPTAL DEFECT, CONGENITAL 06/13/2009   Diabetes mellitus, type 2 (HCC) 06/13/2009   Essential (primary) hypertension 06/13/2009    ONSET DATE: 01/30/2023 (MD referral)  REFERRING DIAG: M46.26 (ICD-10-CM) - Infection of lumbar spine (HCC)   THERAPY DIAG:  Muscle weakness (generalized)  Unsteadiness on feet  Other abnormalities of gait and mobility  Rationale for Evaluation and Treatment: Rehabilitation  SUBJECTIVE:                                                                                                                                                                                             SUBJECTIVE STATEMENT: Stood to cook dinner at the [employer] on Sunday and since then I have had sharp pain.  Have iced it,  couldn't sleep last night,  so I am very tired.  Almost called Dr. Siri Cole [facility]) to see about the pain.  Pt accompanied by: self  PERTINENT HISTORY: Hx of chronic wound (thoracic spine area) having had a postoperative surgery complicated by wound infection with CSF leak and ventriculitis with concern for hardware potentially being involved; osteomyelitis; spinal compression fracture (T10)   67 y.o. female with hx of scoliosis surgery with extensive lower thoracic and lumbar fusion roughly 76yrs ago, she suffered ground level fall in late November, where she fractures through all three columns of L3, thus went to OR on 12/9 for stabilization, new  posterolateral instrumentation fusion on June 26, 2022 complicated by Wound dehiscence with exposed hardware and unstable lumbar spine fracture status post revision of lumbar wound with removal of L1-3 4 and 5 lumbar screws and rod and removal of right L1 screw and revision of L1-L5 posterior instrumented fusion on August 09, 2022 and now concern for postoperative wound infection and ventriculitis. She had complicated protracted course in the icu, but eventually was discharged to CIR on vancomycin and cefepime through 3/14 for ventriculitis and hw complicating wound infection.    Mri of brain on 09/29/22- resolution of ventriculitis.  PAIN:  Are you having pain? Yes: NPRS scale: 7/10 Pain location: lower R flank/low back around fusion site  Pain description: throbbing, achy Aggravating factors: longer car rides  Relieving factors: sitting  PRECAUTIONS: Back, Fall, and Other: hx of osteoporosis and fractures ; avoid twisting; avoid excess bending; is to log roll for bed mobility; (thoracic) wound being followed by wound care.  RED FLAGS: None   WEIGHT BEARING RESTRICTIONS: No  FALLS: Has patient fallen in last 6 months? Yes. Number of falls 1  LIVING ENVIRONMENT: Lives with: lives with their spouse; has caregiver at home;  ramp to enter; one level home Lives in: House/apartment Stairs: Yes: Internal: 1 steps; none; family's homes have steps to get in Has following equipment at home: Dan Humphreys - 2 wheeled, Wheelchair (manual), Shower bench, and bed side commode *has used shoe with lift in the distant past for Leg length discrepancy PLOF: Independent using cane  PATIENT GOALS: Wants to work on balance, walking, stairs, to get in and out of a car betterto be independent again (03/29/2023:  to walk with a cane)  OBJECTIVE:   Pain at incision site no radiating pain.  With skin inspection and palpation, no excess redness or warmth, no unusual tenderness to palpation. TODAY'S TREATMENT: 03/29/2023 Activity Comments  Stair negotiation:  Pt negotiates 2-3 steps with L handrail, 2 reps; with bilat rails, she negotiates 2-3 steps with step through pattern descending, step-to pattern ascending Step-to pattern, min guard (1 handrail)  Gait with RW, 125 ft with RW,  supervision  Short distance gait stair>locked w/c (2-3 steps with no device) supervision  Gait velocity  24 sec; 20.38 sec (1.61 ft/sec) at best Improved from 0.32 ft/sec at eval  Seated march, 2 x 10 reps   Core stability: alt UE lifts with reciprocal leg LAQ 2 x 5; alt UE lifts with gentle march opposite leg, x 5 reps Hot pack at back       Access Code: [email or web address] URL: [email or web address] Date: 03/07/2023 Prepared by: [facility] - [facility]  Exercises - Seated Long Arc Quad with Ankle Weight  - 1 x daily - 7 x weekly - 3 sets - 10 reps - Seated Hamstring Curl with Anchored Resistance  - 1 x daily - 7 x  weekly - 3 sets - 10 reps - Seated Hip Abduction with Resistance  - 1 x daily - 7 x weekly - 3 sets - 10 reps - Supine Quad Set  - 1 x daily - 7 x weekly - 3 sets - 10 reps   PATIENT EDUCATION: Education details: Discussed pt's back pain, use of heat/ice at home and follow up with MD if it doesn't improve, but it  may be due to repetitive motion in standing to cook for the first time in a long time; discussed progress towards goals with stairs and gait velocity; reviewed discussion of follow-up with orthotist about shoe lift for leg length discrepancy>pt will need MD order for shoe lift and insurance will not pay (per PT phone call last week to [employer]) Person educated: Patient and Child(ren)-daughter, Radiation protection practitioner method: Explanation Education comprehension: verbalized understanding  -------------------------------------------------------------------------------------------------- Objective measures below taken at initial evaluation:  DIAGNOSTIC FINDINGS: CT lumbar spine 12/06/2022 IMPRESSION: 1. Pronounced chronic osteopenia. Prior decompression and fusion throughout the lumbar spine.   2. L3 superior endplate/superior body non-acute fracture AND subtle associated horizontal fracture through the chronic posterior element bone mass there (series 9, image 53 today). But no displacement over this series of exams. 3. Difficult to exclude nondisplaced S3/S4 sacral fracture, with mild presacral stranding there.   4. But no other acute osseous abnormality identified. And elsewhere the chronic T12 through sacral ankylosis/arthrodesis appears intact.   5. T10 compression fracture is detailed separately today on CT Thoracic Spine.  COGNITION: Overall cognitive status: Within functional limits for tasks assessed   SENSATION: Light touch: WFL Reports numbness bottom of L foot, sometimes into lateral calf    POSTURE: rounded shoulders, weight shift right, and lateral lean towards R, L shoulder lower than R  LOWER EXTREMITY ROM:   WFL except decreased full Active R hip flexion  LOWER EXTREMITY MMT:    MMT Right Eval Left Eval  Hip flexion 3- 3  Hip extension    Hip abduction    Hip adduction    Hip internal rotation    Hip external rotation    Knee flexion 3+ 3+  Knee extension  3+ 4  Ankle dorsiflexion 3+ 3+  Ankle plantarflexion    Ankle inversion    Ankle eversion    (Blank rows = not tested)   TRANSFERS: Assistive device utilized: Environmental consultant - 2 wheeled  Sit to stand: CGA Stand to sit: CGA  GAIT: Gait pattern: step to pattern, step through pattern, decreased step length- Right, decreased step length- Left, Left hip hike, and knee flexed in stance- Left Distance walked: 35 ft Assistive device utilized: Walker - 2 wheeled Level of assistance: CGA Comments: stops x 1 due to fatigue  FUNCTIONAL TESTS:  10 meter walk test: attempted; unable to complete:  she ambulates 30 ft in 93.6 sec (0.32 ft/sec) Pt stands near walker, no UE support with supervision x 20-30 seconds  TODAY'S TREATMENT:  DATE:       PATIENT EDUCATION: Education details: Eval results, POC Person educated: Patient and Child(ren) Education method: Explanation Education comprehension: verbalized understanding  HOME EXERCISE PROGRAM: Access Code: [email or web address] URL: [email or web address] Date: 02/16/2023 Prepared by: Shary Decamp  Exercises - Seated Long Arc Quad with Ankle Weight  - 1 x daily - 7 x weekly - 3 sets - 10 reps - Seated Hamstring Curl with Anchored Resistance  - 1 x daily - 7 x weekly - 3 sets - 10 reps - Seated Hip Abduction with Resistance  - 1 x daily - 7 x weekly - 3 sets - 10 reps  GOALS: Goals reviewed with patient? Yes  SHORT TERM GOALS: Target date: 03/11/2023  Pt will be independent with HEP for improved strength, balance, gait. Baseline: Goal status: MET, per report 03/14/2023  2.  Pt will perform 5x sit<>stand test in 5 seconds less than baseline measure, to demo improved functional strength. Baseline: 17 sec w/ BUE push-off>16.71 sec with UE support from chair Goal status: NOT YET MET, PROGRESSING  3.  Gait velocity to  improve to 1 ft/sec for improved gait efficiency and safety. Baseline: 0.32 ft/sec>1.55 ft/sec with RW Goal status: MET 03/14/2023    LONG TERM GOALS: Target date: 04/08/2023  Pt will be independent with HEP for improved strength, balance, gait, decreased hip pain. Baseline:  Goal status: IN PROGRESS  2.  Pt will improve 5x sit<>stand to less than or equal to 12 sec to demonstrate improved functional strength and transfer efficiency. Baseline:  Goal status: IN PROGRESS  3.  Pt will improve Berg score by at least 10 points from baseline to decrease fall risk. Baseline: 33/56 Goal status: IN PROGRESS  4.  Gait velocity to improve to 1.8 ft/sec for improved gait efficiency and safety Baseline: 0.32 ft/sec>1.61 ft/sec 03/29/2023 Goal status: IN PROGRESS  5.  Pt will negotiate at least 3 steps with one handrail and min guard for improved negotiation into family's homes. Baseline: step-to pattern, min guard 03/29/2023 Goal status: GOAL MET 03/29/2023  6.  Pt will ambulate at least 500 ft with least restrictive device for improved community gait. Baseline:  Goal status:IN PROGRESS  ASSESSMENT:  CLINICAL IMPRESSION: Pt arrives today and reports increase in back pain in the past several days, not able to get comfortable to sleep last night.  She reports standing in kitchen to cook dinner Sunday, and pain has been sharp and worse since then.  She does not report any radiating symptoms, and no redness/warmth noted at healed incision site.  With short bouts of standing, gait, lower extremity exercises, she reports pain slightly better.  Did utilize heat for <5 minutes at end of session while pt performing exercises (added this to POC today) and pt reports some relief with heat; discussed use of heat/ice for pain and to follow up with MD if needed.  Pt has met LTG 5 for stair negotiation.  She is progressing towards LTG 4 for gait velocity, with gait velocity using RW 1.61 ft/sec (improved from 0.32  ft/sec at eval).  She continues to progress towards goals and is making steady progress with functional mobility, strength, and balance.    OBJECTIVE IMPAIRMENTS: Abnormal gait, decreased balance, decreased mobility, difficulty walking, decreased strength, impaired flexibility, postural dysfunction, and pain.   ACTIVITY LIMITATIONS: bending, standing, stairs, transfers, bathing, toileting, dressing, hygiene/grooming, and locomotion level  PARTICIPATION LIMITATIONS: driving, shopping, and community activity  PERSONAL FACTORS: 3+ comorbidities: See PMH  are also affecting patient's  functional outcome.   REHAB POTENTIAL: Good  CLINICAL DECISION MAKING: Evolving/moderate complexity  EVALUATION COMPLEXITY: Moderate  PLAN:  PT FREQUENCY: 2x/week  PT DURATION: 8 weeks plus eval week  PLANNED INTERVENTIONS: Therapeutic exercises, Therapeutic activity, Neuromuscular re-education, Balance training, Gait training, Patient/Family education, Self Care, Stair training, Orthotic/Fit training, DME instructions, and Moist heat  PLAN FOR NEXT SESSION: By 9/20:  check LTGs and recert (additional goals-gait with cane, look at 2 or 3 MWT).  Continue standing balance-EO/EC (try at beginning of session).  Continue work on core stability (seated) and strengthening.  Continue to progress HEP for lower extremity strengthening. Continue standing weightshifting activities to increase LLE weightshift in standing.  Work on Journalist, newspaper outdoor gait.     Lonia Blood, PT 03/29/23 3:06 PM Phone: [phone] Fax: [phone]  [facility] [address] [place], [address] [place], [place] [address] Phone # [phone] Fax # [phone]

## 2023-03-30 ENCOUNTER — Ambulatory Visit: Payer: Medicare Other | Admitting: Occupational Therapy

## 2023-03-31 ENCOUNTER — Ambulatory Visit: Payer: Medicare Other | Admitting: Occupational Therapy

## 2023-03-31 ENCOUNTER — Ambulatory Visit: Payer: Medicare Other

## 2023-04-05 ENCOUNTER — Ambulatory Visit: Payer: Medicare Other | Admitting: Physical Therapy

## 2023-04-05 ENCOUNTER — Other Ambulatory Visit (HOSPITAL_COMMUNITY): Payer: Self-pay | Admitting: Family Medicine

## 2023-04-05 ENCOUNTER — Encounter: Payer: Medicare Other | Admitting: Occupational Therapy

## 2023-04-05 ENCOUNTER — Ambulatory Visit: Payer: Medicare Other | Admitting: Occupational Therapy

## 2023-04-05 DIAGNOSIS — Z122 Encounter for screening for malignant neoplasm of respiratory organs: Secondary | ICD-10-CM

## 2023-04-06 ENCOUNTER — Other Ambulatory Visit: Payer: Self-pay | Admitting: Family Medicine

## 2023-04-06 DIAGNOSIS — M810 Age-related osteoporosis without current pathological fracture: Secondary | ICD-10-CM

## 2023-04-06 DIAGNOSIS — Z0000 Encounter for general adult medical examination without abnormal findings: Secondary | ICD-10-CM

## 2023-04-07 ENCOUNTER — Telehealth: Payer: Self-pay | Admitting: Physical Therapy

## 2023-04-07 ENCOUNTER — Encounter: Payer: Self-pay | Admitting: Physical Therapy

## 2023-04-07 ENCOUNTER — Ambulatory Visit: Payer: Medicare Other | Admitting: Occupational Therapy

## 2023-04-07 ENCOUNTER — Ambulatory Visit: Payer: Medicare Other | Admitting: Physical Therapy

## 2023-04-07 DIAGNOSIS — M6281 Muscle weakness (generalized): Secondary | ICD-10-CM

## 2023-04-07 DIAGNOSIS — R2681 Unsteadiness on feet: Secondary | ICD-10-CM

## 2023-04-07 DIAGNOSIS — R2689 Other abnormalities of gait and mobility: Secondary | ICD-10-CM

## 2023-04-07 NOTE — Therapy (Signed)
OUTPATIENT OCCUPATIONAL THERAPY NEURO  Treatment Note  Patient Name: [name] MRN: [id number] DOB:1955/10/11, 67 y.o., female Today's Date: 04/07/2023  PCP: Aliene Beams, MD REFERRING PROVIDER: Fanny Dance, MD  END OF SESSION:  OT End of Session - 04/07/23 1103     Visit Number 3    Number of Visits 17    Date for OT Re-Evaluation 05/13/23    Authorization Type BCBS Medicare    OT Start Time 1016    OT Stop Time 1100    OT Time Calculation (min) 44 min               Past Medical History:  Diagnosis Date   Anemia    Anxiety    Arthritis    Bipolar disorder (HCC)    Depression    GERD (gastroesophageal reflux disease)    Heart murmur    "related to VSD"   High cholesterol    History of blood transfusion    "related to OR" (08/19/2016)   History of hiatal hernia    Hypertension    Hyperthyroidism    Mild cognitive impairment 09/06/2018   Paroxysmal ventricular tachycardia (HCC)    Type II diabetes mellitus (HCC)    UTI (urinary tract infection)    being treated with Keflex   Ventricular septal defect    Ventriculitis of brain due to bacteria 11/02/2022   Vertebral fracture, osteoporotic (HCC) 01/06/2023   Past Surgical History:  Procedure Laterality Date   ABDOMINAL HYSTERECTOMY     BACK SURGERY     CARDIAC CATHETERIZATION N/A 06/23/2015   Procedure: Left Heart Cath and Coronary Angiography;  Surgeon: Laurey Morale, MD;  Location: [facility] INVASIVE CV LAB;  Service: Cardiovascular;  Laterality: N/A;   CARDIAC CATHETERIZATION  1960   "VSD was so small; didn't need repaired"   EXAM UNDER ANESTHESIA WITH MANIPULATION OF HIP Right 06/02/2014   dr Jerl Santos   FRACTURE SURGERY     HERNIA REPAIR     HIP CLOSED REDUCTION Right 06/02/2014   Procedure: CLOSED MANIPULATION HIP;  Surgeon: Velna Ochs, MD;  Location: [facility] OR;  Service: Orthopedics;  Laterality: Right;   JOINT REPLACEMENT     JOINT REPLACEMENT     POSTERIOR LUMBAR FUSION 4 LEVEL N/A  06/26/2022   Procedure: Lumbar One To Lumbar Five Posterior Instrumented Fusion;  Surgeon: Jadene Pierini, MD;  Location: [facility] OR;  Service: Neurosurgery;  Laterality: N/A;   REFRACTIVE SURGERY Bilateral    SHOULDER ARTHROSCOPY Right    SHOULDER OPEN ROTATOR CUFF REPAIR Right    SPINAL FUSION  1996   "t10 down to my coccyx   SPINE HARDWARE REMOVAL     TOTAL ABDOMINAL HYSTERECTOMY     TOTAL HIP ARTHROPLASTY Right 05/10/2014   [email or web address]      by dr Cristal Deer olcott   TOTAL KNEE ARTHROPLASTY Left    TOTAL SHOULDER ARTHROPLASTY Left 08/19/2016   Procedure: TOTAL SHOULDER ARTHROPLASTY;  Surgeon: Jones Broom, MD;  Location: [facility] OR;  Service: Orthopedics;  Laterality: Left;  Left total shoulder replacement   Patient Active Problem List   Diagnosis Date Noted   Nonobstructive atherosclerosis of coronary artery 02/10/2023   Vertebral fracture, osteoporotic (HCC) 01/06/2023   Ventriculitis of brain due to bacteria 11/02/2022   Anemia of chronic illness 10/12/2022   Bipolar disorder, in partial remission, most recent episode depressed (HCC) 10/07/2022   Adjustment disorder with mixed anxiety and depressed mood 09/30/2022   Chronic wound 09/25/2022   Hyperglycemia 09/25/2022  Chronic midline thoracic back pain 09/17/2022   Primary insomnia 09/17/2022   Chronic combined systolic and diastolic CHF (congestive heart failure) (HCC) 09/14/2022   Acute blood loss anemia 09/09/2022   Infection of lumbar spine (HCC) 09/03/2022   Malnutrition of moderate degree 08/20/2022   Hardware complicating wound infection (HCC) 08/20/2022   Infection of deep incisional surgical site after procedure 08/20/2022   Aspiration pneumonia of both lungs (HCC) 08/19/2022   Acute respiratory failure with hypoxia (HCC) 08/19/2022   Sepsis (HCC) 08/19/2022   Seizure (HCC) 08/19/2022   Wound dehiscence 08/09/2022   Delayed surgical wound healing 07/08/2022   Fracture of lumbar spine without cord injury (HCC)  06/26/2022   Lumbar vertebral fracture (HCC) 06/25/2022   Mood disorder (HCC) 09/29/2021   Mild cognitive impairment 09/06/2018   Hyponatremia 02/28/2018   S/P shoulder replacement, left 08/19/2016   Anxiety 10/17/2014   Acid reflux 10/17/2014   BP (high blood pressure) 10/17/2014   Arthritis, degenerative 10/17/2014   Adult hypothyroidism 10/17/2014   UTI (urinary tract infection) 06/03/2014   Fracture of bone adjacent to prosthesis 06/03/2014   Diabetes (HCC) 06/02/2014   Peri-prosthetic fracture of femur following total hip arthroplasty 06/02/2014   Urinary retention 05/13/2014   Chronic pain 05/10/2014   History of hip surgery 05/10/2014   UNSPECIFIED HEART FAILURE 06/24/2010   UNSPECIFIED CONGENITAL DEFECT OF SEPTAL CLOSURE 06/24/2010   TOBACCO ABUSE 03/18/2010   Secondary cardiomyopathy (HCC) 03/18/2010   DM 06/13/2009   HYPERTENSION, UNSPECIFIED 06/13/2009   VENTRICULAR TACHYCARDIA 06/13/2009   VENTRICULAR SEPTAL DEFECT, CONGENITAL 06/13/2009   Diabetes mellitus, type 2 (HCC) 06/13/2009   Essential (primary) hypertension 06/13/2009    ONSET DATE: 08/09/22 (referral 02/24/23)  REFERRING DIAG: M62.81 (ICD-10-CM) - Muscle weakness (generalized) MD requesting plan to address upper body strenghtening and full ADL participation  THERAPY DIAG:  Muscle weakness (generalized)  Unsteadiness on feet  Rationale for Evaluation and Treatment: Rehabilitation  SUBJECTIVE:   SUBJECTIVE STATEMENT: Pt reports feeling a lot better today then last appt.  Pt reports staying in bed the whole next day and taking tylenol and flexeril, and after the whole day off she felt a lot better. Pt accompanied by: self and PCA (who dropped her off at appt)  PERTINENT HISTORY: 67 year old female with history of PVT, T2DM, small membranous VSD, depression, bipolar d/o, mild cognitive impairment,, fall 05/2022 with 3 column unstable fracture through prior scoliosis construct s/p instrumentation with  exposed hardware and wound dehiscence. She was admitted on 08/09/2022 for revision of complex lumbar wound, removal of L1-L5 lumbar screws and rods with revision L1-L5 posterior fusion by Dr. Johnsie Cancel.    Postop had issues with fatigue cough, hypotension as well as confusion overnight on 01/24. BLE Dopplers ordered for follow-up on 01/22 and was negative for DVT and showed hypoechoic collection in left popliteal fossa.  Foley placed due to ongoing issues with urinary retention.  Due to ongoing confusion and lethargy, antibiotics added due to concerns of UTI and oxycodone dose decreased due to concerns of side effects.  She started developing drainage from her wound on 01/28 as well as watery diarrhea and stool was negative for C. difficile. [name] consulted due to worsening of respiratory status due to sepsis from acute meningitis and asked patient pneumonia and she required intubation on 02/01.  Neurology consulted due to concerns of seizures and EEG showed moderate diffuse encephalopathy.  MRI brain done showing punctate foci of acute ischemia right frontal lobe and diffusion abnormality within the  occipital horn of left lateral ventricle with proteinaceous/purulent debris.  MRI spine showed L2-L5 fusion widely patent with decrease in bone marrow edema L3.  PRECAUTIONS: Back and Fall (no twisting, minimal bending)  and Other: hx of osteoporosis and fractures ; avoid twisting; avoid excess bending; is to log roll for bed mobility; (thoracic) wound being followed by wound care.   WEIGHT BEARING RESTRICTIONS: No  PAIN:  Are you having pain? Yes: NPRS scale: 2-3/10 Pain location: lower back, around fusion site Pain description: aching Aggravating factors: certain movements Relieving factors: flexeril  FALLS: Has patient fallen in last 6 months? Yes. Number of falls 1, fell the day after she got home.    LIVING ENVIRONMENT: Lives with: lives with their spouse, PCA 8:30- 4 Mon- Fri Lives in:  House/apartment Stairs: Yes: External: 1 steps; caregiver/family bumping pt up the platform step Has following equipment at home: Dan Humphreys - 2 wheeled, Environmental consultant - 4 wheeled, Wheelchair (manual), Shower bench, bed side commode, and Grab bars  PLOF: Independent and Independent with basic ADLs  PATIENT GOALS: "I want to be able to [place]"  OBJECTIVE:   HAND DOMINANCE: Right  ADLs: Transfers/ambulation related to ADLs: CGA - Supervision with RW, stand pivot transfers with and without RW with close supervision Grooming: Mod I, washing face standing and then sitting to put on makeup UB Dressing: Setup/supervision LB Dressing: needs assist with shoes and pants with RLE Toileting: BSC with handles, Supervision for clothing management and hygiene at sit > stand level Bathing: uses walk-in shower in master bathroom, bathing at sit > stand level Tub Shower transfers: close supervision, stabilizing RW when stepping over walk-in shower ledge Equipment: Grab bars, Walk in shower, bed side commode, and built in shower seat, walk-in shower  IADLs: Meal Prep: has cooked a chicken dish and baked spaghetti, standing to retreive items but then sitting for majority of meal prep.  Does have concerns with retrieving items from upper and lower cabinets, refrigerator.  Difficulty with opening jars/containers Medication management: daughter fills up pill box and pt/husband/caregivers assisting as needed  MOBILITY STATUS: Needs Assist: min guard - supervision ambulatory transfers, Supervision - Mod I stand pivot from w/c  POSTURE COMMENTS:  rounded shoulders, weight shift right, and lateral lean towards R, L shoulder lower than R   ACTIVITY TOLERANCE: Activity tolerance: WFL for tasks assessed on eval  UPPER EXTREMITY ROM:  shoulder flexion with compensatory abduction but able to reach [facility], pt also with full ROM with internal/external rotation  UPPER EXTREMITY MMT:   grossly  3+ to 4/5 bilaterally   HAND FUNCTION: Grip strength: Right: 22 lbs; Left: 30 lbs and Tip pinch: Right 8 lbs, Left: 7 lbs  COORDINATION: 9 Hole Peg test: Right: 21.97 sec; Left: 25.69 sec  SENSATION: R foot is still numb on the bottom, BUE sensation intact  COGNITION: Overall cognitive status: Within functional limits for tasks assessed   TODAY'S TREATMENT:                                                                                          DATE:  04/07/23 LB  dressing: pt reports sitting in a lower chair in her bedroom to allow for increased ease with donning underwear and shorts.  Pt reports attempting basket for step stool but reports that the basket was too tall. Hand Gripper: with RUE on level 20# with yellow spring. Pt picked up 1 inch blocks with gripper with min difficulty, increasing drops and difficulty with repetition and onset of fatigue.  Completed with LUE on level 25# with yellow spring. Pt picked up 1 inch blocks with gripper with fewer drops than RUE, however still with reports of fatigue and increased difficulty with repetition. UE strengthening: OT instructed in horizontal abduction, diagonal reach, and scapular retraction with red theraband.  OT providing min cues for technique and positioning to increase ROM and facilitate increased strengthening.     03/29/23 Sleeping positions: Pt reports not able to sleep last night due to increased pain after doing some meal prep.  Pt did report good use of energy conservation strategies with sitting during cutting items, but did do some standing with UE pressure through arms on RW.  Discussed pillow placement and bent knees for additional offloading of pressure and lumbar spine, pt plans to attempt further repositioning tonight. LB dressing: educated on variety of sitting postures and positioning to increase ease and independence with LB dressing.  Pt with difficulty lifting RLE, however able to place B feet onto 4" step.  Pt then  demonstrating increased ease with reaching towards feet as needed for donning and tying shoes.  Discussed options to recreate position at home. RUE strengthening: OT instructed pt in various grip and pinch exercises as well as thumb flexion and extension.  Pt continue to report difficulty with strength and coordination with R hand during functional tasks. Exercises - Putty Squeezes  - Tip Pinch with Putty  - 3-Point Pinch with Putty   - Key Pinch with Putty   - Thumb MCP and IP Flexion with Putty   - Thumb Radial Abduction with Putty Loop     03/14/23 Eval only  PATIENT EDUCATION: Education details: ongoing condition specific education and modification to increase ease with LB dressing Person educated: Patient Education method: Explanation Education comprehension: verbalized understanding and needs further education  HOME EXERCISE PROGRAM: Access Code: [email or web address] URL: [email or web address] Date: 03/29/2023 Prepared by: [facility] - Outpatient  Rehab - [facility]  Exercises - Putty Squeezes  - 1 x daily - 1 sets - 10 reps - Tip Pinch with Putty  - 1 x daily - 1 sets - 10 reps - 3-Point Pinch with Putty  - 1 x daily - 1 sets - 10 reps - Key Pinch with Putty  - 1 x daily - 1 sets - 10 reps - Thumb MCP and IP Flexion with Putty  - 1 x daily - 1 sets - 10 reps - Thumb Radial Abduction with Putty Loop  - 1 x daily - 7 x weekly - 1 sets - 10 reps  Access Code: [email or web address] URL: [email or web address] Date: 04/07/2023 Prepared by: [facility] - Outpatient  Rehab - [facility]  Exercises - Seated Shoulder Horizontal Abduction with Resistance  - 1-2 x daily - 1 sets - 10 reps - Seated Diagonal Reach with Resistance  - 1-2 x daily - 1 sets - 10 reps - Scapular Retraction with Resistance  - 1-2 x daily - 1 sets - 10 reps   GOALS: Goals reviewed with patient? Yes  SHORT TERM GOALS: Target date: 04/15/23  Pt will be independent in BUE  strengthening  HEP. Baseline: Goal status: IN PROGRESS  2.  Pt will verbalize understanding of task modifications and/or potential AE needs to increase ease, safety, and independence w/ ADLs. Baseline:  Goal status: IN PROGRESS  3.  Pt will demonstrate improved grip strength by 4# with RUE to increase success with opening food containers. Baseline: R:22# and L: 30# Goal status: IN PROGRESS    LONG TERM GOALS: Target date: 05/13/23  Pt will be able to complete LB dressing at Mod I level utilizing compensatory strategies and/or AE as needed. Baseline:  Goal status: IN PROGRESS  2.  Pt will demonstrate improved ease with stand pivot and short distance ambulatory transfers to access bathroom toilet and in/out of car at Mod I level. Baseline:  Goal status: IN PROGRESS  3.  Pt will verbalize and/or demonstrate safe techniques to retreive items from floor when dropped. Baseline:  Goal status: IN PROGRESS  4.  Pt will demonstrate improved balance and endurance to maintain dynamic standing balance for 10 mins to complete ADLs and/or IADLs without needing seated rest break. Baseline:  Goal status: IN PROGRESS  5.  Pt will be able to demonstrate improved standing balance and positioning of RW as need to retreive items from upper and lower cabinets and refrigerator at Mod I level. Baseline:  Goal status: IN PROGRESS  6.  Pt will demonstrate improved UE and grip strength as needed to engage in functional meal prep tasks (open containers and sustain grasp on items during meal prep). Baseline:  Goal status: IN PROGRESS  ASSESSMENT:  CLINICAL IMPRESSION: Pt reports modifying setup for LB dressing at home with improved ease and independence when sitting in lower chair.  Pt with good understanding of strengthening exercises for trunk and BUE with use of red theraband.  PERFORMANCE DEFICITS: in functional skills including ADLs, IADLs, ROM, strength, pain, Gross motor control, balance, body mechanics,  endurance, decreased knowledge of precautions, decreased knowledge of use of DME, and UE functional use and psychosocial skills including coping strategies and environmental adaptation.     PLAN:  OT FREQUENCY: 2x/week  OT DURATION: 8 weeks  PLANNED INTERVENTIONS: self care/ADL training, therapeutic exercise, therapeutic activity, neuromuscular re-education, balance training, functional mobility training, moist heat, cryotherapy, patient/family education, psychosocial skills training, energy conservation, coping strategies training, and DME and/or AE instructions  RECOMMENDED OTHER SERVICES: NA  CONSULTED AND AGREED WITH PLAN OF CARE: Patient  PLAN FOR NEXT SESSION: Transfers and dynamic standing/reaching.  Continue to address possible modifications for LB dressing; UB strengthening   Salik Grewell, OTR/L 04/07/2023, 11:03 AM

## 2023-04-07 NOTE — Telephone Encounter (Signed)
[name] is currently being seen by physical therapy.  We have discussed possibility of external shoe lift for RLE, due to significant leg length difference.  I have spoken with Probation officer and they require an MD order for external shoe lift.  If you agree, could you please write order for Orthotic consult for external shoe lift?  Thank you.  Lonia Blood, PT 04/07/23 10:51 AM Phone: [phone] Fax: [phone]   [facility] at [facility] [address] [place], [address] [place], [place] [address] Phone # [phone] Fax # [phone]

## 2023-04-07 NOTE — Therapy (Signed)
OUTPATIENT PHYSICAL THERAPY NEURO TREATMENT/RECERT   Patient Name: [name] MRN: [id number] DOB:[birth year], 67 y.o., female Today's Date: 04/07/2023   PCP: Aliene Beams, MD REFERRING PROVIDER: Fanny Dance, MD    END OF SESSION:  PT End of Session - 04/07/23 1048     Visit Number 13    Number of Visits 29    Date for PT Re-Evaluation 06/03/23   per recert 04/07/2023   Authorization Type [employer] Medicare    Progress Note Due on Visit --   (PN completed at visit 9)   PT Start Time 1100    PT Stop Time 1140    PT Time Calculation (min) 40 min    Equipment Utilized During Treatment Gait belt    Activity Tolerance Patient tolerated treatment well    Behavior During Therapy WFL for tasks assessed/performed                      Past Medical History:  Diagnosis Date   Anemia    Anxiety    Arthritis    Bipolar disorder (HCC)    Depression    GERD (gastroesophageal reflux disease)    Heart murmur    "related to VSD"   High cholesterol    History of blood transfusion    "related to OR" (08/19/2016)   History of hiatal hernia    Hypertension    Hyperthyroidism    Mild cognitive impairment 09/06/2018   Paroxysmal ventricular tachycardia (HCC)    Type II diabetes mellitus (HCC)    UTI (urinary tract infection)    being treated with Keflex   Ventricular septal defect    Ventriculitis of brain due to bacteria 11/02/2022   Vertebral fracture, osteoporotic (HCC) 01/06/2023   Past Surgical History:  Procedure Laterality Date   ABDOMINAL HYSTERECTOMY     BACK SURGERY     CARDIAC CATHETERIZATION N/A 06/23/2015   Procedure: Left Heart Cath and Coronary Angiography;  Surgeon: Laurey Morale, MD;  Location: [facility] INVASIVE CV LAB;  Service: Cardiovascular;  Laterality: N/A;   CARDIAC CATHETERIZATION  1960   "VSD was so small; didn't need repaired"   EXAM UNDER ANESTHESIA WITH MANIPULATION OF HIP Right 06/02/2014   dr Jerl Santos   FRACTURE SURGERY      HERNIA REPAIR     HIP CLOSED REDUCTION Right 06/02/2014   Procedure: CLOSED MANIPULATION HIP;  Surgeon: Velna Ochs, MD;  Location: [facility] OR;  Service: Orthopedics;  Laterality: Right;   JOINT REPLACEMENT     JOINT REPLACEMENT     POSTERIOR LUMBAR FUSION 4 LEVEL N/A 06/26/2022   Procedure: Lumbar One To Lumbar Five Posterior Instrumented Fusion;  Surgeon: Jadene Pierini, MD;  Location: [facility] OR;  Service: Neurosurgery;  Laterality: N/A;   REFRACTIVE SURGERY Bilateral    SHOULDER ARTHROSCOPY Right    SHOULDER OPEN ROTATOR CUFF REPAIR Right    SPINAL FUSION  1996   "t10 down to my coccyx   SPINE HARDWARE REMOVAL     TOTAL ABDOMINAL HYSTERECTOMY     TOTAL HIP ARTHROPLASTY Right 05/10/2014   [place]      by dr Cristal Deer olcott   TOTAL KNEE ARTHROPLASTY Left    TOTAL SHOULDER ARTHROPLASTY Left 08/19/2016   Procedure: TOTAL SHOULDER ARTHROPLASTY;  Surgeon: Jones Broom, MD;  Location: [facility] OR;  Service: Orthopedics;  Laterality: Left;  Left total shoulder replacement   Patient Active Problem List   Diagnosis Date Noted   Nonobstructive atherosclerosis of coronary artery 02/10/2023  Vertebral fracture, osteoporotic (HCC) 01/06/2023   Ventriculitis of brain due to bacteria 11/02/2022   Anemia of chronic illness 10/12/2022   Bipolar disorder, in partial remission, most recent episode depressed (HCC) 10/07/2022   Adjustment disorder with mixed anxiety and depressed mood 09/30/2022   Chronic wound 09/25/2022   Hyperglycemia 09/25/2022   Chronic midline thoracic back pain 09/17/2022   Primary insomnia 09/17/2022   Chronic combined systolic and diastolic CHF (congestive heart failure) (HCC) 09/14/2022   Acute blood loss anemia 09/09/2022   Infection of lumbar spine (HCC) 09/03/2022   Malnutrition of moderate degree 08/20/2022   Hardware complicating wound infection (HCC) 08/20/2022   Infection of deep incisional surgical site after procedure 08/20/2022   Aspiration pneumonia of  both lungs (HCC) 08/19/2022   Acute respiratory failure with hypoxia (HCC) 08/19/2022   Sepsis (HCC) 08/19/2022   Seizure (HCC) 08/19/2022   Wound dehiscence 08/09/2022   Delayed surgical wound healing 07/08/2022   Fracture of lumbar spine without cord injury (HCC) 06/26/2022   Lumbar vertebral fracture (HCC) 06/25/2022   Mood disorder (HCC) 09/29/2021   Mild cognitive impairment 09/06/2018   Hyponatremia 02/28/2018   S/P shoulder replacement, left 08/19/2016   Anxiety 10/17/2014   Acid reflux 10/17/2014   BP (high blood pressure) 10/17/2014   Arthritis, degenerative 10/17/2014   Adult hypothyroidism 10/17/2014   UTI (urinary tract infection) 06/03/2014   Fracture of bone adjacent to prosthesis 06/03/2014   Diabetes (HCC) 06/02/2014   Peri-prosthetic fracture of femur following total hip arthroplasty 06/02/2014   Urinary retention 05/13/2014   Chronic pain 05/10/2014   History of hip surgery 05/10/2014   UNSPECIFIED HEART FAILURE 06/24/2010   UNSPECIFIED CONGENITAL DEFECT OF SEPTAL CLOSURE 06/24/2010   TOBACCO ABUSE 03/18/2010   Secondary cardiomyopathy (HCC) 03/18/2010   DM 06/13/2009   HYPERTENSION, UNSPECIFIED 06/13/2009   VENTRICULAR TACHYCARDIA 06/13/2009   VENTRICULAR SEPTAL DEFECT, CONGENITAL 06/13/2009   Diabetes mellitus, type 2 (HCC) 06/13/2009   Essential (primary) hypertension 06/13/2009    ONSET DATE: 01/30/2023 (MD referral)  REFERRING DIAG: M46.26 (ICD-10-CM) - Infection of lumbar spine (HCC)   THERAPY DIAG:  Muscle weakness (generalized)  Unsteadiness on feet  Other abnormalities of gait and mobility  Rationale for Evaluation and Treatment: Rehabilitation  SUBJECTIVE:                                                                                                                                                                                             SUBJECTIVE STATEMENT: Slept all day the next day after last visit and the pain.  When I woke up,  the pain was gone.  Just  having typical scoliosis pain today.    Pt accompanied by: self  PERTINENT HISTORY: Hx of chronic wound (thoracic spine area) having had a postoperative surgery complicated by wound infection with CSF leak and ventriculitis with concern for hardware potentially being involved; osteomyelitis; spinal compression fracture (T10)   67 y.o. female with hx of scoliosis surgery with extensive lower thoracic and lumbar fusion roughly 20yrs ago, she suffered ground level fall in late November, where she fractures through all three columns of L3, thus went to OR on 12/9 for stabilization, new  posterolateral instrumentation fusion on June 26, 2022 complicated by Wound dehiscence with exposed hardware and unstable lumbar spine fracture status post revision of lumbar wound with removal of L1-3 4 and 5 lumbar screws and rod and removal of right L1 screw and revision of L1-L5 posterior instrumented fusion on August 09, 2022 and now concern for postoperative wound infection and ventriculitis. She had complicated protracted course in the icu, but eventually was discharged to CIR on vancomycin and cefepime through 3/14 for ventriculitis and hw complicating wound infection.    Mri of brain on 09/29/22- resolution of ventriculitis.  PAIN:  Are you having pain? Yes: NPRS scale: 2-3/10 Pain location: lower R flank/low back around fusion site  Pain description: throbbing, achy Aggravating factors: longer car rides  Relieving factors: sitting  PRECAUTIONS: Back, Fall, and Other: hx of osteoporosis and fractures ; avoid twisting; avoid excess bending; is to log roll for bed mobility; (thoracic) wound being followed by wound care.  RED FLAGS: None   WEIGHT BEARING RESTRICTIONS: No  FALLS: Has patient fallen in last 6 months? Yes. Number of falls 1  LIVING ENVIRONMENT: Lives with: lives with their spouse; has caregiver at home; ramp to enter; one level home Lives in:  House/apartment Stairs: Yes: Internal: 1 steps; none; family's homes have steps to get in Has following equipment at home: Dan Humphreys - 2 wheeled, Wheelchair (manual), Shower bench, and bed side commode *has used shoe with lift in the distant past for Leg length discrepancy PLOF: Independent using cane  PATIENT GOALS: Wants to work on balance, walking, stairs, to get in and out of a car better to be independent again (03/29/2023:  to walk with a cane)  OBJECTIVE:     TODAY'S TREATMENT: 9/19/20024 Activity Comments  FTSTS:  17.22 sec From 18 in chair, BUE support  Berg:  39/56 Improved from 33/56  Gait velocity 17.81 sec (1.84 ft/sec) Improved from 1.6 ft/sec  Outdoor gait x 80 ft with RW on patio/sidewalk surface, min guard Extra time for negotiating sidewalk cracks and slight incline     wth RW:  42 ft x 3 + 12 ft = 138 ft 1 seated rest break  TUG with cane:  20.84 sec        [facility] PT Assessment - 04/07/23 1117       Berg Balance Test   Sit to Stand Able to stand without using hands and stabilize independently    Standing Unsupported Able to stand safely 2 minutes    Sitting with Back Unsupported but Feet Supported on Floor or Stool Able to sit safely and securely 2 minutes    Stand to Sit Sits safely with minimal use of hands    Transfers Able to transfer safely, definite need of hands    Standing Unsupported with Eyes Closed Able to stand 10 seconds safely    Standing Unsupported with Feet Together Able to place feet together independently and stand 1  minute safely    From Standing, Reach Forward with Outstretched Arm Can reach forward >12 cm safely (5")    From Standing Position, Pick up Object from Floor Able to pick up shoe, needs supervision    From Standing Position, Turn to Look Behind Over each Shoulder Turn sideways only but maintains balance    Turn 360 Degrees Needs close supervision or verbal cueing    Standing Unsupported, Alternately Place Feet on Step/Stool Able  to complete >2 steps/needs minimal assist    Standing Unsupported, One Foot in Front Needs help to step but can hold 15 seconds    Standing on One Leg Tries to lift leg/unable to hold 3 seconds but remains standing independently    Total Score 39                  Access Code: [email or web address] URL: [email or web address] Date: 03/07/2023 Prepared by: [facility] - Outpatient  Rehab - [facility]  Exercises - Seated Long Arc Quad with Ankle Weight  - 1 x daily - 7 x weekly - 3 sets - 10 reps - Seated Hamstring Curl with Anchored Resistance  - 1 x daily - 7 x weekly - 3 sets - 10 reps - Seated Hip Abduction with Resistance  - 1 x daily - 7 x weekly - 3 sets - 10 reps - Supine Quad Set  - 1 x daily - 7 x weekly - 3 sets - 10 reps   PATIENT EDUCATION: Education details: progress towards goals, POC  Person educated: Patient Education method: Explanation Education comprehension: verbalized understanding    -------------------------------------------------------------------------------------------------- Objective measures below taken at initial evaluation:  DIAGNOSTIC FINDINGS: CT lumbar spine 16-Dec-2022 IMPRESSION: 1. Pronounced chronic osteopenia. Prior decompression and fusion throughout the lumbar spine.   2. L3 superior endplate/superior body non-acute fracture AND subtle associated horizontal fracture through the chronic posterior element bone mass there (series 9, image 53 today). But no displacement over this series of exams. 3. Difficult to exclude nondisplaced S3/S4 sacral fracture, with mild presacral stranding there.   4. But no other acute osseous abnormality identified. And elsewhere the chronic T12 through sacral ankylosis/arthrodesis appears intact.   5. T10 compression fracture is detailed separately today on CT Thoracic Spine.  COGNITION: Overall cognitive status: Within functional limits for tasks assessed   SENSATION: Light touch:  WFL Reports numbness bottom of L foot, sometimes into lateral calf    POSTURE: rounded shoulders, weight shift right, and lateral lean towards R, L shoulder lower than R  LOWER EXTREMITY ROM:   WFL except decreased full Active R hip flexion  LOWER EXTREMITY MMT:    MMT Right Eval Left Eval  Hip flexion 3- 3  Hip extension    Hip abduction    Hip adduction    Hip internal rotation    Hip external rotation    Knee flexion 3+ 3+  Knee extension 3+ 4  Ankle dorsiflexion 3+ 3+  Ankle plantarflexion    Ankle inversion    Ankle eversion    (Blank rows = not tested)   TRANSFERS: Assistive device utilized: Environmental consultant - 2 wheeled  Sit to stand: CGA Stand to sit: CGA  GAIT: Gait pattern: step to pattern, step through pattern, decreased step length- Right, decreased step length- Left, Left hip hike, and knee flexed in stance- Left Distance walked: 35 ft Assistive device utilized: Walker - 2 wheeled Level of assistance: CGA Comments: stops x 1 due to fatigue  FUNCTIONAL TESTS:  10 meter  walk test: attempted; unable to complete:  she ambulates 30 ft in 93.6 sec (0.32 ft/sec) Pt stands near walker, no UE support with supervision x 20-30 seconds  TODAY'S TREATMENT:                                                                                                                              DATE:       PATIENT EDUCATION: Education details: Eval results, POC Person educated: Patient and Child(ren) Education method: Explanation Education comprehension: verbalized understanding  HOME EXERCISE PROGRAM: Access Code: [email or web address] URL: [email or web address] Date: 02/16/2023 Prepared by: Shary Decamp  Exercises - Seated Long Arc Quad with Ankle Weight  - 1 x daily - 7 x weekly - 3 sets - 10 reps - Seated Hamstring Curl with Anchored Resistance  - 1 x daily - 7 x weekly - 3 sets - 10 reps - Seated Hip Abduction with Resistance  - 1 x daily - 7 x weekly - 3 sets - 10  reps  GOALS: Goals reviewed with patient? Yes  SHORT TERM GOALS: Target date: 03/11/2023  Pt will be independent with HEP for improved strength, balance, gait. Baseline: Goal status: MET, per report 03/14/2023  2.  Pt will perform 5x sit<>stand test in 5 seconds less than baseline measure, to demo improved functional strength. Baseline: 17 sec w/ BUE push-off>16.71 sec with UE support from chair Goal status: NOT YET MET, PROGRESSING  3.  Gait velocity to improve to 1 ft/sec for improved gait efficiency and safety. Baseline: 0.32 ft/sec>1.55 ft/sec with RW Goal status: MET 03/14/2023    LONG TERM GOALS: Target date: 04/08/2023  Pt will be independent with HEP for improved strength, balance, gait, decreased hip pain. Baseline:  Goal status: MET, per pt report  2.  Pt will improve 5x sit<>stand to less than or equal to 12 sec to demonstrate improved functional strength and transfer efficiency. Baseline: 17.22 sec 04/07/2023 UE support Goal status: IN PROGRESS, NOT YET MET 04/07/2023  3.  Pt will improve Berg score by at least 10 points from baseline to decrease fall risk. Baseline: 33/56>39/56 Goal status: PARTIALLY MET 04/07/2023  4.  Gait velocity to improve to 1.8 ft/sec for improved gait efficiency and safety Baseline: 0.32 ft/sec>1.61 ft/sec 03/29/2023> 1.84 ft/sec with RW Goal status: MET 04/07/2023  5.  Pt will negotiate at least 3 steps with one handrail and min guard for improved negotiation into family's homes. Baseline: step-to pattern, min guard 03/29/2023 Goal status: GOAL MET 03/29/2023  6.  Pt will ambulate at least 500 ft with least restrictive device for improved community gait. Baseline:  Goal status:IN PROGRESS, NOT YET MET 04/07/2023  SHORT TERM GOALS: (UPDATED) Target date: 05/06/2023  Pt will be independent with progression of HEP for improved strength, balance, gait. Baseline: Goal status: INITIAL  2.  Pt will improve 5x sit<>stand to less than or equal to  15 sec to demonstrate improved functional strength and transfer efficiency.  Baseline: 17.22 sec with UE support Goal status: INITIAL  3.  Pt will improve TUG score to less than or equal to 17 sec for decreased fall risk. Baseline: 20.84 sec Goal status: INITIAL   LONG TERM GOALS: UPDATED Target date: 06/03/2023  Pt will verbalize plans for continued community fitness upon d/c from PT to maximize gains made in rehab. Baseline:  Goal status: INITIAL  2.  Pt will improve to 180 ft to demonstrate improved functional strength and transfer efficiency. Baseline: 138 ft  Goal status: INITIAL  3.  Pt will improve TUG score to less than or equal to 14 sec for decreased fall risk. Baseline: 20.84 sec Goal status: INITIAL  4.  Pt will improve gait velocity to at least 2 ft/sec (with cane versus RW) for improved gait efficiency and safety. Baseline: 1.84 ft/sec with RW Goal status: INITIAL  5.  Pt will ambulate at least 200 ft using SPC, mod I for improved household gait. Baseline: TUG with cane 20.84 sec Goal status: INITIAL  6.  Pt will ambulate at least 500 ft with RW for improved community gait. Baseline:  Goal status: INITIAL  ASSESSMENT:  CLINICAL IMPRESSION: LTGs assessed today, with pt meeting 3 of 6 LTGs.  She has met LTG 1, 4, 5 for HEP, gait velocity, and stairs.  She has partially met LTG for Berg, improving from 33>39/56.  She has not yet met LTG 2 and 6.  She has demonstrated improvement overall since initial evaluation.  With measures assessed today with SPC, she is at fall risk per TUG and Berg, as well as gait velocity of 1.8 ft/sec.  She has goals to improve gait to use of cane; leg length discrepancy is affecting higher level balance activities.  Her back does fatigue with attempts at longer distance gait.  She continues to follow up with orthopedic MD and with Rehab MD; PT has placed request for order for external shoe lift for possible orthotic consult to assist  with balance and gait with shoe lift.  She remains motivated for therapy and will continue to benefit from skilled PT towards updated goals.  OBJECTIVE IMPAIRMENTS: Abnormal gait, decreased balance, decreased mobility, difficulty walking, decreased strength, impaired flexibility, postural dysfunction, and pain.   ACTIVITY LIMITATIONS: bending, standing, stairs, transfers, bathing, toileting, dressing, hygiene/grooming, and locomotion level  PARTICIPATION LIMITATIONS: driving, shopping, and community activity  PERSONAL FACTORS: 3+ comorbidities: See PMH  are also affecting patient's functional outcome.   REHAB POTENTIAL: Good  CLINICAL DECISION MAKING: Evolving/moderate complexity  EVALUATION COMPLEXITY: Moderate  PLAN:  PT FREQUENCY: 2x/week  PT DURATION: 8 weeks plus eval week  PLANNED INTERVENTIONS: Therapeutic exercises, Therapeutic activity, Neuromuscular re-education, Balance training, Gait training, Patient/Family education, Self Care, Stair training, Orthotic/Fit training, DME instructions, and Moist heat  PLAN FOR NEXT SESSION: Continue standing balance-EO/EC and varied foot positions.  Continue work on core stability (seated) and strengthening.  Continue to progress HEP for lower extremity strengthening. Continue standing weightshifting activities to increase LLE weightshift in standing.  Work on Journalist, newspaper outdoor gait.  (Maybe consider standing exercises at some point for HEP)   Lonia Blood, PT 04/07/23 12:43 PM Phone: [phone] Fax: [phone]  [facility] at [facility] [address] [place], [address] [place], [place] [address] Phone # [phone] Fax # [phone]

## 2023-04-08 ENCOUNTER — Other Ambulatory Visit: Payer: Self-pay

## 2023-04-08 MED ORDER — DOXYCYCLINE HYCLATE 100 MG PO TABS: 100.0000 mg | ORAL_TABLET | Freq: Two times a day (BID) | ORAL | 4 refills | Status: DC

## 2023-04-12 ENCOUNTER — Ambulatory Visit: Payer: Medicare Other | Admitting: Occupational Therapy

## 2023-04-12 ENCOUNTER — Ambulatory Visit: Payer: Medicare Other | Admitting: Physical Therapy

## 2023-04-12 ENCOUNTER — Encounter: Payer: Self-pay | Admitting: Physical Therapy

## 2023-04-12 DIAGNOSIS — M6281 Muscle weakness (generalized): Secondary | ICD-10-CM

## 2023-04-12 DIAGNOSIS — R2681 Unsteadiness on feet: Secondary | ICD-10-CM

## 2023-04-12 DIAGNOSIS — R2689 Other abnormalities of gait and mobility: Secondary | ICD-10-CM

## 2023-04-12 NOTE — Therapy (Signed)
OUTPATIENT OCCUPATIONAL THERAPY NEURO  Treatment Note  Patient Name: [name] MRN: [id number] DOB:1955-11-03, 67 y.o., female Today's Date: 04/12/2023  PCP: Aliene Beams, MD REFERRING PROVIDER: Fanny Dance, MD  END OF SESSION:  OT End of Session - 04/12/23 1235     Visit Number 4    Number of Visits 17    Date for OT Re-Evaluation 05/13/23    Authorization Type BCBS Medicare    OT Start Time 1233    OT Stop Time 1315    OT Time Calculation (min) 42 min                Past Medical History:  Diagnosis Date   Anemia    Anxiety    Arthritis    Bipolar disorder (HCC)    Depression    GERD (gastroesophageal reflux disease)    Heart murmur    "related to VSD"   High cholesterol    History of blood transfusion    "related to OR" (08/19/2016)   History of hiatal hernia    Hypertension    Hyperthyroidism    Mild cognitive impairment 09/06/2018   Paroxysmal ventricular tachycardia (HCC)    Type II diabetes mellitus (HCC)    UTI (urinary tract infection)    being treated with Keflex   Ventricular septal defect    Ventriculitis of brain due to bacteria 11/02/2022   Vertebral fracture, osteoporotic (HCC) 01/06/2023   Past Surgical History:  Procedure Laterality Date   ABDOMINAL HYSTERECTOMY     BACK SURGERY     CARDIAC CATHETERIZATION N/A 06/23/2015   Procedure: Left Heart Cath and Coronary Angiography;  Surgeon: Laurey Morale, MD;  Location: [facility] INVASIVE CV LAB;  Service: Cardiovascular;  Laterality: N/A;   CARDIAC CATHETERIZATION  1960   "VSD was so small; didn't need repaired"   EXAM UNDER ANESTHESIA WITH MANIPULATION OF HIP Right 06/02/2014   dr Jerl Santos   FRACTURE SURGERY     HERNIA REPAIR     HIP CLOSED REDUCTION Right 06/02/2014   Procedure: CLOSED MANIPULATION HIP;  Surgeon: Velna Ochs, MD;  Location: [facility] OR;  Service: Orthopedics;  Laterality: Right;   JOINT REPLACEMENT     JOINT REPLACEMENT     POSTERIOR LUMBAR FUSION 4 LEVEL  N/A 06/26/2022   Procedure: Lumbar One To Lumbar Five Posterior Instrumented Fusion;  Surgeon: Jadene Pierini, MD;  Location: [facility] OR;  Service: Neurosurgery;  Laterality: N/A;   REFRACTIVE SURGERY Bilateral    SHOULDER ARTHROSCOPY Right    SHOULDER OPEN ROTATOR CUFF REPAIR Right    SPINAL FUSION  1996   "t10 down to my coccyx   SPINE HARDWARE REMOVAL     TOTAL ABDOMINAL HYSTERECTOMY     TOTAL HIP ARTHROPLASTY Right 05/10/2014   [email or web address]      by dr Cristal Deer olcott   TOTAL KNEE ARTHROPLASTY Left    TOTAL SHOULDER ARTHROPLASTY Left 08/19/2016   Procedure: TOTAL SHOULDER ARTHROPLASTY;  Surgeon: Jones Broom, MD;  Location: [facility] OR;  Service: Orthopedics;  Laterality: Left;  Left total shoulder replacement   Patient Active Problem List   Diagnosis Date Noted   Nonobstructive atherosclerosis of coronary artery 02/10/2023   Vertebral fracture, osteoporotic (HCC) 01/06/2023   Ventriculitis of brain due to bacteria 11/02/2022   Anemia of chronic illness 10/12/2022   Bipolar disorder, in partial remission, most recent episode depressed (HCC) 10/07/2022   Adjustment disorder with mixed anxiety and depressed mood 09/30/2022   Chronic wound 09/25/2022   Hyperglycemia  09/25/2022   Chronic midline thoracic back pain 09/17/2022   Primary insomnia 09/17/2022   Chronic combined systolic and diastolic CHF (congestive heart failure) (HCC) 09/14/2022   Acute blood loss anemia 09/09/2022   Infection of lumbar spine (HCC) 09/03/2022   Malnutrition of moderate degree 08/20/2022   Hardware complicating wound infection (HCC) 08/20/2022   Infection of deep incisional surgical site after procedure 08/20/2022   Aspiration pneumonia of both lungs (HCC) 08/19/2022   Acute respiratory failure with hypoxia (HCC) 08/19/2022   Sepsis (HCC) 08/19/2022   Seizure (HCC) 08/19/2022   Wound dehiscence 08/09/2022   Delayed surgical wound healing 07/08/2022   Fracture of lumbar spine without cord injury (HCC)  06/26/2022   Lumbar vertebral fracture (HCC) 06/25/2022   Mood disorder (HCC) 09/29/2021   Mild cognitive impairment 09/06/2018   Hyponatremia 02/28/2018   S/P shoulder replacement, left 08/19/2016   Anxiety 10/17/2014   Acid reflux 10/17/2014   BP (high blood pressure) 10/17/2014   Arthritis, degenerative 10/17/2014   Adult hypothyroidism 10/17/2014   UTI (urinary tract infection) 06/03/2014   Fracture of bone adjacent to prosthesis 06/03/2014   Diabetes (HCC) 06/02/2014   Peri-prosthetic fracture of femur following total hip arthroplasty 06/02/2014   Urinary retention 05/13/2014   Chronic pain 05/10/2014   History of hip surgery 05/10/2014   UNSPECIFIED HEART FAILURE 06/24/2010   UNSPECIFIED CONGENITAL DEFECT OF SEPTAL CLOSURE 06/24/2010   TOBACCO ABUSE 03/18/2010   Secondary cardiomyopathy (HCC) 03/18/2010   DM 06/13/2009   HYPERTENSION, UNSPECIFIED 06/13/2009   VENTRICULAR TACHYCARDIA 06/13/2009   VENTRICULAR SEPTAL DEFECT, CONGENITAL 06/13/2009   Diabetes mellitus, type 2 (HCC) 06/13/2009   Essential (primary) hypertension 06/13/2009    ONSET DATE: 08/09/22 (referral 02/24/23)  REFERRING DIAG: M62.81 (ICD-10-CM) - Muscle weakness (generalized) MD requesting plan to address upper body strenghtening and full ADL participation  THERAPY DIAG:  Muscle weakness (generalized)  Unsteadiness on feet  Rationale for Evaluation and Treatment: Rehabilitation  SUBJECTIVE:   SUBJECTIVE STATEMENT: Pt reports difficulty falling asleep last night, due to back pain, but was eventually able to. Pt accompanied by: self and PCA (who dropped her off at appt)  PERTINENT HISTORY: 67 year old female with history of PVT, T2DM, small membranous VSD, depression, bipolar d/o, mild cognitive impairment,, fall 05/2022 with 3 column unstable fracture through prior scoliosis construct s/p instrumentation with exposed hardware and wound dehiscence. She was admitted on 08/09/2022 for revision of complex  lumbar wound, removal of L1-L5 lumbar screws and rods with revision L1-L5 posterior fusion by Dr. Johnsie Cancel.    Postop had issues with fatigue cough, hypotension as well as confusion overnight on 01/24. BLE Dopplers ordered for follow-up on 01/22 and was negative for DVT and showed hypoechoic collection in left popliteal fossa.  Foley placed due to ongoing issues with urinary retention.  Due to ongoing confusion and lethargy, antibiotics added due to concerns of UTI and oxycodone dose decreased due to concerns of side effects.  She started developing drainage from her wound on 01/28 as well as watery diarrhea and stool was negative for C. difficile. [name] consulted due to worsening of respiratory status due to sepsis from acute meningitis and asked patient pneumonia and she required intubation on 02/01.  Neurology consulted due to concerns of seizures and EEG showed moderate diffuse encephalopathy.  MRI brain done showing punctate foci of acute ischemia right frontal lobe and diffusion abnormality within the occipital horn of left lateral ventricle with proteinaceous/purulent debris.  MRI spine showed L2-L5 fusion widely patent  with decrease in bone marrow edema L3.  PRECAUTIONS: Back and Fall (no twisting, minimal bending)  and Other: hx of osteoporosis and fractures ; avoid twisting; avoid excess bending; is to log roll for bed mobility; (thoracic) wound being followed by wound care.   WEIGHT BEARING RESTRICTIONS: No  PAIN:  Are you having pain? Yes: NPRS scale: 3/10 Pain location: lower back, around fusion site Pain description: aching Aggravating factors: certain movements Relieving factors: flexeril  FALLS: Has patient fallen in last 6 months? Yes. Number of falls 1, fell the day after she got home.    LIVING ENVIRONMENT: Lives with: lives with their spouse, PCA 8:30- 4 Mon- Fri Lives in: House/apartment Stairs: Yes: External: 1 steps; caregiver/family bumping pt up the platform step Has  following equipment at home: Dan Humphreys - 2 wheeled, Environmental consultant - 4 wheeled, Wheelchair (manual), Shower bench, bed side commode, and Grab bars  PLOF: Independent and Independent with basic ADLs  PATIENT GOALS: "I want to be able [place]"  OBJECTIVE:   HAND DOMINANCE: Right  ADLs: Transfers/ambulation related to ADLs: CGA - Supervision with RW, stand pivot transfers with and without RW with close supervision Grooming: Mod I, washing face standing and then sitting to put on makeup UB Dressing: Setup/supervision LB Dressing: needs assist with shoes and pants with RLE Toileting: BSC with handles, Supervision for clothing management and hygiene at sit > stand level Bathing: uses walk-in shower in master bathroom, bathing at sit > stand level Tub Shower transfers: close supervision, stabilizing RW when stepping over walk-in shower ledge Equipment: Grab bars, Walk in shower, bed side commode, and built in shower seat, walk-in shower  IADLs: Meal Prep: has cooked a chicken dish and baked spaghetti, standing to retreive items but then sitting for majority of meal prep.  Does have concerns with retrieving items from upper and lower cabinets, refrigerator.  Difficulty with opening jars/containers Medication management: daughter fills up pill box and pt/husband/caregivers assisting as needed  MOBILITY STATUS: Needs Assist: min guard - supervision ambulatory transfers, Supervision - Mod I stand pivot from w/c  POSTURE COMMENTS:  rounded shoulders, weight shift right, and lateral lean towards R, L shoulder lower than R   ACTIVITY TOLERANCE: Activity tolerance: WFL for tasks assessed on eval  UPPER EXTREMITY ROM:  shoulder flexion with compensatory abduction but able to reach [facility], pt also with full ROM with internal/external rotation  UPPER EXTREMITY MMT:   grossly 3+ to 4/5 bilaterally   HAND FUNCTION: Grip strength: Right: 22 lbs; Left: 30 lbs and Tip pinch:  Right 8 lbs, Left: 7 lbs  COORDINATION: 9 Hole Peg test: Right: 21.97 sec; Left: 25.69 sec  SENSATION: R foot is still numb on the bottom, BUE sensation intact  COGNITION: Overall cognitive status: Within functional limits for tasks assessed   TODAY'S TREATMENT:                                                                                          DATE:  04/12/23 Transfers: pt completed short distance ambulation transfer w/c to mat table with close supervision.  Meal prep: pt  reports getting all her ingredients and spices and standing when browning meat.  Pt reports having to bend down to get a pot and wonders if there is a better way to complete. LB dressing: simulated LB dressing with gait belt with pt able to lift each leg and advance belt under foot.  Pt then demonstrating sit > stand to pull "pants" over hips with supervision.  Pipe tree puzzle:  engaged in standing 4 mins and 5 mins while completing pipe tree puzzle to simulate carry over to dynamic standing as needed for self-care and meal prep tasks.  Pt demonstrating weight shifting and seated rest breaks as needed due to increase in pain in back with prolonged standing.   04/07/23 LB dressing: pt reports sitting in a lower chair in her bedroom to allow for increased ease with donning underwear and shorts.  Pt reports attempting basket for step stool but reports that the basket was too tall. Hand Gripper: with RUE on level 20# with yellow spring. Pt picked up 1 inch blocks with gripper with min difficulty, increasing drops and difficulty with repetition and onset of fatigue.  Completed with LUE on level 25# with yellow spring. Pt picked up 1 inch blocks with gripper with fewer drops than RUE, however still with reports of fatigue and increased difficulty with repetition. UE strengthening: OT instructed in horizontal abduction, diagonal reach, and scapular retraction with red theraband.  OT providing min cues for technique and  positioning to increase ROM and facilitate increased strengthening.     03/29/23 Sleeping positions: Pt reports not able to sleep last night due to increased pain after doing some meal prep.  Pt did report good use of energy conservation strategies with sitting during cutting items, but did do some standing with UE pressure through arms on RW.  Discussed pillow placement and bent knees for additional offloading of pressure and lumbar spine, pt plans to attempt further repositioning tonight. LB dressing: educated on variety of sitting postures and positioning to increase ease and independence with LB dressing.  Pt with difficulty lifting RLE, however able to place B feet onto 4" step.  Pt then demonstrating increased ease with reaching towards feet as needed for donning and tying shoes.  Discussed options to recreate position at home. RUE strengthening: OT instructed pt in various grip and pinch exercises as well as thumb flexion and extension.  Pt continue to report difficulty with strength and coordination with R hand during functional tasks. Exercises - Putty Squeezes  - Tip Pinch with Putty  - 3-Point Pinch with Putty   - Key Pinch with Putty   - Thumb MCP and IP Flexion with Putty   - Thumb Radial Abduction with Putty Loop     PATIENT EDUCATION: Education details: ongoing condition specific education and modification to increase ease with LB dressing Person educated: Patient Education method: Explanation Education comprehension: verbalized understanding and needs further education  HOME EXERCISE PROGRAM: Access Code: [email or web address] URL: [email or web address] Date: 03/29/2023 Prepared by: [facility] - Outpatient  Rehab - [facility]  Exercises - Putty Squeezes  - 1 x daily - 1 sets - 10 reps - Tip Pinch with Putty  - 1 x daily - 1 sets - 10 reps - 3-Point Pinch with Putty  - 1 x daily - 1 sets - 10 reps - Key Pinch with Putty  - 1 x daily - 1 sets - 10 reps - Thumb MCP  and IP Flexion with Putty  - 1 x daily -  1 sets - 10 reps - Thumb Radial Abduction with Putty Loop  - 1 x daily - 7 x weekly - 1 sets - 10 reps  Access Code: [email or web address] URL: [email or web address] Date: 04/07/2023 Prepared by: [facility] - Outpatient  Rehab - [facility]  Exercises - Seated Shoulder Horizontal Abduction with Resistance  - 1-2 x daily - 1 sets - 10 reps - Seated Diagonal Reach with Resistance  - 1-2 x daily - 1 sets - 10 reps - Scapular Retraction with Resistance  - 1-2 x daily - 1 sets - 10 reps   GOALS: Goals reviewed with patient? Yes  SHORT TERM GOALS: Target date: 04/15/23  Pt will be independent in BUE strengthening HEP. Baseline: Goal status: IN PROGRESS  2.  Pt will verbalize understanding of task modifications and/or potential AE needs to increase ease, safety, and independence w/ ADLs. Baseline:  Goal status: IN PROGRESS  3.  Pt will demonstrate improved grip strength by 4# with RUE to increase success with opening food containers. Baseline: R:22# and L: 30# Goal status: IN PROGRESS    LONG TERM GOALS: Target date: 05/13/23  Pt will be able to complete LB dressing at Mod I level utilizing compensatory strategies and/or AE as needed. Baseline:  Goal status: IN PROGRESS  2.  Pt will demonstrate improved ease with stand pivot and short distance ambulatory transfers to access bathroom toilet and in/out of car at Mod I level. Baseline:  Goal status: IN PROGRESS  3.  Pt will verbalize and/or demonstrate safe techniques to retreive items from floor when dropped. Baseline:  Goal status: IN PROGRESS  4.  Pt will demonstrate improved balance and endurance to maintain dynamic standing balance for 10 mins to complete ADLs and/or IADLs without needing seated rest break. Baseline:  Goal status: IN PROGRESS  5.  Pt will be able to demonstrate improved standing balance and positioning of RW as need to retreive items from upper and lower  cabinets and refrigerator at Mod I level. Baseline:  Goal status: IN PROGRESS  6.  Pt will demonstrate improved UE and grip strength as needed to engage in functional meal prep tasks (open containers and sustain grasp on items during meal prep). Baseline:  Goal status: IN PROGRESS  ASSESSMENT:  CLINICAL IMPRESSION: Pt demonstrating increased ease with sit > stand and dynamic standing this session.  Pt does continue to report back pain with prolonged unsupported sitting or standing.  PERFORMANCE DEFICITS: in functional skills including ADLs, IADLs, ROM, strength, pain, Gross motor control, balance, body mechanics, endurance, decreased knowledge of precautions, decreased knowledge of use of DME, and UE functional use and psychosocial skills including coping strategies and environmental adaptation.     PLAN:  OT FREQUENCY: 2x/week  OT DURATION: 8 weeks  PLANNED INTERVENTIONS: self care/ADL training, therapeutic exercise, therapeutic activity, neuromuscular re-education, balance training, functional mobility training, moist heat, cryotherapy, patient/family education, psychosocial skills training, energy conservation, coping strategies training, and DME and/or AE instructions  RECOMMENDED OTHER SERVICES: NA  CONSULTED AND AGREED WITH PLAN OF CARE: Patient  PLAN FOR NEXT SESSION: Transfers and dynamic standing/reaching.  Continue to address possible modifications for LB dressing; UB strengthening   Rosalio Loud, OTR/L 04/12/2023, 12:35 PM   [facility] at [facility] [address] [place], [address] [place], [place] [address] Phone # [phone] Fax # [phone]

## 2023-04-12 NOTE — Therapy (Signed)
OUTPATIENT PHYSICAL THERAPY NEURO TREATMENT/RECERT   Patient Name: [name] MRN: [id number] DOB:August 24, 1955, 67 y.o., female Today's Date: 04/12/2023   PCP: Aliene Beams, MD REFERRING PROVIDER: Fanny Dance, MD    END OF SESSION:  PT End of Session - 04/12/23 1322     Visit Number 14    Number of Visits 29    Date for PT Re-Evaluation 06/03/23   per recert 04/07/2023   Authorization Type [employer] Medicare    Progress Note Due on Visit --   (PN completed at visit 9)   PT Start Time 1319    PT Stop Time 1359    PT Time Calculation (min) 40 min    Equipment Utilized During Treatment --    Activity Tolerance Patient tolerated treatment well    Behavior During Therapy WFL for tasks assessed/performed                       Past Medical History:  Diagnosis Date   Anemia    Anxiety    Arthritis    Bipolar disorder (HCC)    Depression    GERD (gastroesophageal reflux disease)    Heart murmur    "related to VSD"   High cholesterol    History of blood transfusion    "related to OR" (08/19/2016)   History of hiatal hernia    Hypertension    Hyperthyroidism    Mild cognitive impairment 09/06/2018   Paroxysmal ventricular tachycardia (HCC)    Type II diabetes mellitus (HCC)    UTI (urinary tract infection)    being treated with Keflex   Ventricular septal defect    Ventriculitis of brain due to bacteria 11/02/2022   Vertebral fracture, osteoporotic (HCC) 01/06/2023   Past Surgical History:  Procedure Laterality Date   ABDOMINAL HYSTERECTOMY     BACK SURGERY     CARDIAC CATHETERIZATION N/A 06/23/2015   Procedure: Left Heart Cath and Coronary Angiography;  Surgeon: Laurey Morale, MD;  Location: [facility] INVASIVE CV LAB;  Service: Cardiovascular;  Laterality: N/A;   CARDIAC CATHETERIZATION  1960   "VSD was so small; didn't need repaired"   EXAM UNDER ANESTHESIA WITH MANIPULATION OF HIP Right 06/02/2014   dr Jerl Santos   FRACTURE SURGERY     HERNIA  REPAIR     HIP CLOSED REDUCTION Right 06/02/2014   Procedure: CLOSED MANIPULATION HIP;  Surgeon: Velna Ochs, MD;  Location: [facility] OR;  Service: Orthopedics;  Laterality: Right;   JOINT REPLACEMENT     JOINT REPLACEMENT     POSTERIOR LUMBAR FUSION 4 LEVEL N/A 06/26/2022   Procedure: Lumbar One To Lumbar Five Posterior Instrumented Fusion;  Surgeon: Jadene Pierini, MD;  Location: [facility] OR;  Service: Neurosurgery;  Laterality: N/A;   REFRACTIVE SURGERY Bilateral    SHOULDER ARTHROSCOPY Right    SHOULDER OPEN ROTATOR CUFF REPAIR Right    SPINAL FUSION  1996   "t10 down to my coccyx   SPINE HARDWARE REMOVAL     TOTAL ABDOMINAL HYSTERECTOMY     TOTAL HIP ARTHROPLASTY Right 05/10/2014   [email or web address]      by dr Cristal Deer olcott   TOTAL KNEE ARTHROPLASTY Left    TOTAL SHOULDER ARTHROPLASTY Left 08/19/2016   Procedure: TOTAL SHOULDER ARTHROPLASTY;  Surgeon: Jones Broom, MD;  Location: [facility] OR;  Service: Orthopedics;  Laterality: Left;  Left total shoulder replacement   Patient Active Problem List   Diagnosis Date Noted   Nonobstructive atherosclerosis of coronary artery 02/10/2023  Vertebral fracture, osteoporotic (HCC) 01/06/2023   Ventriculitis of brain due to bacteria 11/02/2022   Anemia of chronic illness 10/12/2022   Bipolar disorder, in partial remission, most recent episode depressed (HCC) 10/07/2022   Adjustment disorder with mixed anxiety and depressed mood 09/30/2022   Chronic wound 09/25/2022   Hyperglycemia 09/25/2022   Chronic midline thoracic back pain 09/17/2022   Primary insomnia 09/17/2022   Chronic combined systolic and diastolic CHF (congestive heart failure) (HCC) 09/14/2022   Acute blood loss anemia 09/09/2022   Infection of lumbar spine (HCC) 09/03/2022   Malnutrition of moderate degree 08/20/2022   Hardware complicating wound infection (HCC) 08/20/2022   Infection of deep incisional surgical site after procedure 08/20/2022   Aspiration pneumonia of both  lungs (HCC) 08/19/2022   Acute respiratory failure with hypoxia (HCC) 08/19/2022   Sepsis (HCC) 08/19/2022   Seizure (HCC) 08/19/2022   Wound dehiscence 08/09/2022   Delayed surgical wound healing 07/08/2022   Fracture of lumbar spine without cord injury (HCC) 06/26/2022   Lumbar vertebral fracture (HCC) 06/25/2022   Mood disorder (HCC) 09/29/2021   Mild cognitive impairment 09/06/2018   Hyponatremia 02/28/2018   S/P shoulder replacement, left 08/19/2016   Anxiety 10/17/2014   Acid reflux 10/17/2014   BP (high blood pressure) 10/17/2014   Arthritis, degenerative 10/17/2014   Adult hypothyroidism 10/17/2014   UTI (urinary tract infection) 06/03/2014   Fracture of bone adjacent to prosthesis 06/03/2014   Diabetes (HCC) 06/02/2014   Peri-prosthetic fracture of femur following total hip arthroplasty 06/02/2014   Urinary retention 05/13/2014   Chronic pain 05/10/2014   History of hip surgery 05/10/2014   UNSPECIFIED HEART FAILURE 06/24/2010   UNSPECIFIED CONGENITAL DEFECT OF SEPTAL CLOSURE 06/24/2010   TOBACCO ABUSE 03/18/2010   Secondary cardiomyopathy (HCC) 03/18/2010   DM 06/13/2009   HYPERTENSION, UNSPECIFIED 06/13/2009   VENTRICULAR TACHYCARDIA 06/13/2009   VENTRICULAR SEPTAL DEFECT, CONGENITAL 06/13/2009   Diabetes mellitus, type 2 (HCC) 06/13/2009   Essential (primary) hypertension 06/13/2009    ONSET DATE: 01/30/2023 (MD referral)  REFERRING DIAG: M46.26 (ICD-10-CM) - Infection of lumbar spine (HCC)   THERAPY DIAG:  Muscle weakness (generalized)  Unsteadiness on feet  Other abnormalities of gait and mobility  Rationale for Evaluation and Treatment: Rehabilitation  SUBJECTIVE:                                                                                                                                                                                             SUBJECTIVE STATEMENT[place]  Able to walk back and forth to the bathroom,  multiple times in the day (improved  from [facility] being brought to me); walked into restaurant from car.  Talked with husband and don't really want to pursue the shoe lift (because of hx of trying this in the past with significant pain)  Pt accompanied by: self  PERTINENT HISTORY: Hx of chronic wound (thoracic spine area) having had a postoperative surgery complicated by wound infection with CSF leak and ventriculitis with concern for hardware potentially being involved; osteomyelitis; spinal compression fracture (T10)   67 y.o. female with hx of scoliosis surgery with extensive lower thoracic and lumbar fusion roughly 60yrs ago, she suffered ground level fall in late November, where she fractures through all three columns of L3, thus went to OR on 12/9 for stabilization, new  posterolateral instrumentation fusion on June 26, 2022 complicated by Wound dehiscence with exposed hardware and unstable lumbar spine fracture status post revision of lumbar wound with removal of L1-3 4 and 5 lumbar screws and rod and removal of right L1 screw and revision of L1-L5 posterior instrumented fusion on August 09, 2022 and now concern for postoperative wound infection and ventriculitis. She had complicated protracted course in the icu, but eventually was discharged to CIR on vancomycin and cefepime through 3/14 for ventriculitis and hw complicating wound infection.    Mri of brain on 09/29/22- resolution of ventriculitis.  PAIN:  Are you having pain? Yes: NPRS scale: 3/10 Pain location: lower R flank/low back around fusion site  Pain description: throbbing, achy Aggravating factors: longer car rides  Relieving factors: sitting  PRECAUTIONS: Back, Fall, and Other: hx of osteoporosis and fractures ; avoid twisting; avoid excess bending; is to log roll for bed mobility; (thoracic) wound being followed by wound care.  RED FLAGS: None   WEIGHT BEARING RESTRICTIONS: No  FALLS: Has patient fallen in last 6 months?  Yes. Number of falls 1  LIVING ENVIRONMENT: Lives with: lives with their spouse; has caregiver at home; ramp to enter; one level home Lives in: House/apartment Stairs: Yes: Internal: 1 steps; none; family's homes have steps to get in Has following equipment at home: Dan Humphreys - 2 wheeled, Wheelchair (manual), Shower bench, and bed side commode *has used shoe with lift in the distant past for Leg length discrepancy PLOF: Independent using cane  PATIENT GOALS: Wants to work on balance, walking, stairs, to get in and out of a car better to be independent again (03/29/2023:  to walk with a cane)  OBJECTIVE:    TODAY'S TREATMENT: 04/12/2023 Activity Comments  Seated leg strengthening BLE: LAQ 3 x 10, 5# Resisted hamstring curls 3 x 10, green band Cues for eccentric control  Supine leg strengthening: SAQ 5#, 3 x 10 reps Supine quad sets 3 x 10, 3 sec Clamshell with resistance 3 x 10   Gait with cane at end of session, 25 ft with min guard assist               Access Code: [email or web address] URL: [email or web address] Date: 03/07/2023 Prepared by: [facility] - Outpatient  Rehab - [facility]  Exercises - Seated Long Arc Quad with Ankle Weight  - 1 x daily - 7 x weekly - 3 sets - 10 reps - Seated Hamstring Curl with Anchored Resistance  - 1 x daily - 7 x weekly - 3 sets - 10 reps - Seated Hip Abduction with Resistance  - 1 x daily - 7 x weekly - 3 sets - 10 reps - Supine Quad Set  - 1 x daily - 7 x weekly - 3 sets -  10 reps   PATIENT EDUCATION: Education details: 04/12/23:  Reviewed seated HEP and reminded pt to include seated hamstring curls (could have family member or caregiver hold theraband) Person educated: Patient Education method: Explanation Education comprehension: verbalized understanding    -------------------------------------------------------------------------------------------------- Objective measures below taken at initial evaluation:  DIAGNOSTIC  FINDINGS: CT lumbar spine December 19, 2022 IMPRESSION: 1. Pronounced chronic osteopenia. Prior decompression and fusion throughout the lumbar spine.   2. L3 superior endplate/superior body non-acute fracture AND subtle associated horizontal fracture through the chronic posterior element bone mass there (series 9, image 53 today). But no displacement over this series of exams. 3. Difficult to exclude nondisplaced S3/S4 sacral fracture, with mild presacral stranding there.   4. But no other acute osseous abnormality identified. And elsewhere the chronic T12 through sacral ankylosis/arthrodesis appears intact.   5. T10 compression fracture is detailed separately today on CT Thoracic Spine.  COGNITION: Overall cognitive status: Within functional limits for tasks assessed   SENSATION: Light touch: WFL Reports numbness bottom of L foot, sometimes into lateral calf    POSTURE: rounded shoulders, weight shift right, and lateral lean towards R, L shoulder lower than R  LOWER EXTREMITY ROM:   WFL except decreased full Active R hip flexion  LOWER EXTREMITY MMT:    MMT Right Eval Left Eval  Hip flexion 3- 3  Hip extension    Hip abduction    Hip adduction    Hip internal rotation    Hip external rotation    Knee flexion 3+ 3+  Knee extension 3+ 4  Ankle dorsiflexion 3+ 3+  Ankle plantarflexion    Ankle inversion    Ankle eversion    (Blank rows = not tested)   TRANSFERS: Assistive device utilized: Environmental consultant - 2 wheeled  Sit to stand: CGA Stand to sit: CGA  GAIT: Gait pattern: step to pattern, step through pattern, decreased step length- Right, decreased step length- Left, Left hip hike, and knee flexed in stance- Left Distance walked: 35 ft Assistive device utilized: Walker - 2 wheeled Level of assistance: CGA Comments: stops x 1 due to fatigue  FUNCTIONAL TESTS:  10 meter walk test: attempted; unable to complete:  she ambulates 30 ft in 93.6 sec (0.32 ft/sec) Pt stands  near walker, no UE support with supervision x 20-30 seconds  TODAY'S TREATMENT:                                                                                                                              DATE:       PATIENT EDUCATION: Education details: Eval results, POC Person educated: Patient and Child(ren) Education method: Explanation Education comprehension: verbalized understanding  HOME EXERCISE PROGRAM: Access Code: [email or web address] URL: [email or web address] Date: 02/16/2023 Prepared by: Shary Decamp  Exercises - Seated Long Arc Quad with Ankle Weight  - 1 x daily - 7 x weekly - 3 sets - 10 reps - Seated Hamstring Curl with Anchored Resistance  - 1  x daily - 7 x weekly - 3 sets - 10 reps - Seated Hip Abduction with Resistance  - 1 x daily - 7 x weekly - 3 sets - 10 reps  GOALS: Goals reviewed with patient? Yes   SHORT TERM GOALS: (UPDATED) Target date: 05/06/2023  Pt will be independent with progression of HEP for improved strength, balance, gait. Baseline: Goal status: IN PROGRESS  2.  Pt will improve 5x sit<>stand to less than or equal to 15 sec to demonstrate improved functional strength and transfer efficiency. Baseline: 17.22 sec with UE support Goal status: IN PROGRESS  3.  Pt will improve TUG score to less than or equal to 17 sec for decreased fall risk. Baseline: 20.84 sec Goal status: IN PROGRESS   LONG TERM GOALS: UPDATED Target date: 06/03/2023  Pt will verbalize plans for continued community fitness upon d/c from PT to maximize gains made in rehab. Baseline:  Goal status: IN PROGRESS  2.  Pt will improve to 180 ft to demonstrate improved functional strength and transfer efficiency. Baseline: 138 ft  Goal status: IN PROGRESS  3.  Pt will improve TUG score to less than or equal to 14 sec for decreased fall risk. Baseline: 20.84 sec Goal status: IN PROGRESS  4.  Pt will improve gait velocity to at least 2 ft/sec (with cane  versus RW) for improved gait efficiency and safety. Baseline: 1.84 ft/sec with RW Goal status: IN PROGRESS  5.  Pt will ambulate at least 200 ft using SPC, mod I for improved household gait. Baseline: TUG with cane 20.84 sec Goal status: IN PROGRESS  6.  Pt will ambulate at least 500 ft with RW for improved community gait. Baseline:  Goal status: IN PROGRESS  ASSESSMENT:  CLINICAL IMPRESSION: Skilled PT session today addressed lower extremity strengthening, in supine and seated positions, as pt performed functional standing tasks for much of OT session prior to PT.  She does report doing more functional distance walking, multiple times through the day with RW to and from bathroom as well as from car into restaurant.  With seated exercises, she does demo decreased eccentric control with resistance, but improves with cues.  She remains motivated for therapy and will continue to benefit from skilled PT towards updated goals for improved strength, balance, gait.  OBJECTIVE IMPAIRMENTS: Abnormal gait, decreased balance, decreased mobility, difficulty walking, decreased strength, impaired flexibility, postural dysfunction, and pain.   ACTIVITY LIMITATIONS: bending, standing, stairs, transfers, bathing, toileting, dressing, hygiene/grooming, and locomotion level  PARTICIPATION LIMITATIONS: driving, shopping, and community activity  PERSONAL FACTORS: 3+ comorbidities: See PMH  are also affecting patient's functional outcome.   REHAB POTENTIAL: Good  CLINICAL DECISION MAKING: Evolving/moderate complexity  EVALUATION COMPLEXITY: Moderate  PLAN:  PT FREQUENCY: 2x/week  PT DURATION: 8 weeks plus eval week  PLANNED INTERVENTIONS: Therapeutic exercises, Therapeutic activity, Neuromuscular re-education, Balance training, Gait training, Patient/Family education, Self Care, Stair training, Orthotic/Fit training, DME instructions, and Moist heat  PLAN FOR NEXT SESSION: Continue standing  balance-EO/EC and varied foot positions.  Continue work on core stability (seated) and strengthening.  Continue to progress HEP for lower extremity strengthening. Continue standing weightshifting activities to increase LLE weightshift in standing.  Work on Journalist, newspaper outdoor gait.  (Maybe consider standing exercises at some point for HEP)   Lonia Blood, PT 04/12/23 2:54 PM Phone: [phone] Fax: [phone]  [facility] at [facility] [address] [place], [address] [place], [place] [address] Phone # [phone] Fax # [phone]

## 2023-04-13 ENCOUNTER — Encounter (HOSPITAL_COMMUNITY): Payer: Self-pay | Admitting: Family Medicine

## 2023-04-14 ENCOUNTER — Ambulatory Visit: Payer: Medicare Other | Admitting: Occupational Therapy

## 2023-04-14 ENCOUNTER — Ambulatory Visit: Payer: Medicare Other

## 2023-04-18 NOTE — Therapy (Signed)
OUTPATIENT PHYSICAL THERAPY NEURO TREATMENT   Patient Name: [name] MRN: [id number] DOB:Feb 19, 1956, 67 y.o., female Today's Date: 04/18/2023   PCP: Aliene Beams, MD REFERRING PROVIDER: Fanny Dance, MD    END OF SESSION:              Past Medical History:  Diagnosis Date   Anemia    Anxiety    Arthritis    Bipolar disorder (HCC)    Depression    GERD (gastroesophageal reflux disease)    Heart murmur    "related to VSD"   High cholesterol    History of blood transfusion    "related to OR" (08/19/2016)   History of hiatal hernia    Hypertension    Hyperthyroidism    Mild cognitive impairment 09/06/2018   Paroxysmal ventricular tachycardia (HCC)    Type II diabetes mellitus (HCC)    UTI (urinary tract infection)    being treated with Keflex   Ventricular septal defect    Ventriculitis of brain due to bacteria 11/02/2022   Vertebral fracture, osteoporotic (HCC) 01/06/2023   Past Surgical History:  Procedure Laterality Date   ABDOMINAL HYSTERECTOMY     BACK SURGERY     CARDIAC CATHETERIZATION N/A 06/23/2015   Procedure: Left Heart Cath and Coronary Angiography;  Surgeon: Laurey Morale, MD;  Location: [facility] INVASIVE CV LAB;  Service: Cardiovascular;  Laterality: N/A;   CARDIAC CATHETERIZATION  1960   "VSD was so small; didn't need repaired"   EXAM UNDER ANESTHESIA WITH MANIPULATION OF HIP Right 06/02/2014   dr Jerl Santos   FRACTURE SURGERY     HERNIA REPAIR     HIP CLOSED REDUCTION Right 06/02/2014   Procedure: CLOSED MANIPULATION HIP;  Surgeon: Velna Ochs, MD;  Location: [facility] OR;  Service: Orthopedics;  Laterality: Right;   JOINT REPLACEMENT     JOINT REPLACEMENT     POSTERIOR LUMBAR FUSION 4 LEVEL N/A 06/26/2022   Procedure: Lumbar One To Lumbar Five Posterior Instrumented Fusion;  Surgeon: Jadene Pierini, MD;  Location: [facility] OR;  Service: Neurosurgery;  Laterality: N/A;   REFRACTIVE SURGERY Bilateral    SHOULDER ARTHROSCOPY Right     SHOULDER OPEN ROTATOR CUFF REPAIR Right    SPINAL FUSION  1996   "t10 down to my coccyx   SPINE HARDWARE REMOVAL     TOTAL ABDOMINAL HYSTERECTOMY     TOTAL HIP ARTHROPLASTY Right 05/10/2014   [place]      by dr Cristal Deer olcott   TOTAL KNEE ARTHROPLASTY Left    TOTAL SHOULDER ARTHROPLASTY Left 08/19/2016   Procedure: TOTAL SHOULDER ARTHROPLASTY;  Surgeon: Jones Broom, MD;  Location: [facility] OR;  Service: Orthopedics;  Laterality: Left;  Left total shoulder replacement   Patient Active Problem List   Diagnosis Date Noted   Nonobstructive atherosclerosis of coronary artery 02/10/2023   Vertebral fracture, osteoporotic (HCC) 01/06/2023   Ventriculitis of brain due to bacteria 11/02/2022   Anemia of chronic illness 10/12/2022   Bipolar disorder, in partial remission, most recent episode depressed (HCC) 10/07/2022   Adjustment disorder with mixed anxiety and depressed mood 09/30/2022   Chronic wound 09/25/2022   Hyperglycemia 09/25/2022   Chronic midline thoracic back pain 09/17/2022   Primary insomnia 09/17/2022   Chronic combined systolic and diastolic CHF (congestive heart failure) (HCC) 09/14/2022   Acute blood loss anemia 09/09/2022   Infection of lumbar spine (HCC) 09/03/2022   Malnutrition of moderate degree 08/20/2022   Hardware complicating wound infection (HCC) 08/20/2022   Infection  of deep incisional surgical site after procedure 08/20/2022   Aspiration pneumonia of both lungs (HCC) 08/19/2022   Acute respiratory failure with hypoxia (HCC) 08/19/2022   Sepsis (HCC) 08/19/2022   Seizure (HCC) 08/19/2022   Wound dehiscence 08/09/2022   Delayed surgical wound healing 07/08/2022   Fracture of lumbar spine without cord injury (HCC) 06/26/2022   Lumbar vertebral fracture (HCC) 06/25/2022   Mood disorder (HCC) 09/29/2021   Mild cognitive impairment 09/06/2018   Hyponatremia 02/28/2018   S/P shoulder replacement, left 08/19/2016   Anxiety 10/17/2014   Acid reflux  10/17/2014   BP (high blood pressure) 10/17/2014   Arthritis, degenerative 10/17/2014   Adult hypothyroidism 10/17/2014   UTI (urinary tract infection) 06/03/2014   Fracture of bone adjacent to prosthesis 06/03/2014   Diabetes (HCC) 06/02/2014   Peri-prosthetic fracture of femur following total hip arthroplasty 06/02/2014   Urinary retention 05/13/2014   Chronic pain 05/10/2014   History of hip surgery 05/10/2014   UNSPECIFIED HEART FAILURE 06/24/2010   UNSPECIFIED CONGENITAL DEFECT OF SEPTAL CLOSURE 06/24/2010   TOBACCO ABUSE 03/18/2010   Secondary cardiomyopathy (HCC) 03/18/2010   DM 06/13/2009   HYPERTENSION, UNSPECIFIED 06/13/2009   VENTRICULAR TACHYCARDIA 06/13/2009   VENTRICULAR SEPTAL DEFECT, CONGENITAL 06/13/2009   Diabetes mellitus, type 2 (HCC) 06/13/2009   Essential (primary) hypertension 06/13/2009    ONSET DATE: 01/30/2023 (MD referral)  REFERRING DIAG: M46.26 (ICD-10-CM) - Infection of lumbar spine (HCC)   THERAPY DIAG:  No diagnosis found.  Rationale for Evaluation and Treatment: Rehabilitation  SUBJECTIVE:                                                                                                                                                                                             SUBJECTIVE STATEMENT[place]  Able to walk back and forth to the bathroom, multiple times in the day (improved from [facility] being brought to me); walked into restaurant from car.  Talked with husband and don't really want to pursue the shoe lift (because of hx of trying this in the past with significant pain)  Pt accompanied by: self  PERTINENT HISTORY: Hx of chronic wound (thoracic spine area) having had a postoperative surgery complicated by wound infection with CSF leak and ventriculitis with concern for hardware potentially being involved; osteomyelitis; spinal compression fracture (T10)   67 y.o. female with hx of scoliosis surgery with  extensive lower thoracic and lumbar fusion roughly 56yrs ago, she suffered ground level fall in late November, where she fractures through all three columns of L3, thus went to OR on 12/9 for stabilization, new  posterolateral instrumentation fusion on June 26, 2022 complicated by Wound dehiscence with exposed hardware and unstable lumbar spine fracture status post revision of lumbar wound with removal of L1-3 4 and 5 lumbar screws and rod and removal of right L1 screw and revision of L1-L5 posterior instrumented fusion on August 09, 2022 and now concern for postoperative wound infection and ventriculitis. She had complicated protracted course in the icu, but eventually was discharged to CIR on vancomycin and cefepime through 3/14 for ventriculitis and hw complicating wound infection.    Mri of brain on 09/29/22- resolution of ventriculitis.  PAIN:  Are you having pain? Yes: NPRS scale: 3/10 Pain location: lower R flank/low back around fusion site  Pain description: throbbing, achy Aggravating factors: longer car rides  Relieving factors: sitting  PRECAUTIONS: Back, Fall, and Other: hx of osteoporosis and fractures ; avoid twisting; avoid excess bending; is to log roll for bed mobility; (thoracic) wound being followed by wound care.  RED FLAGS: None   WEIGHT BEARING RESTRICTIONS: No  FALLS: Has patient fallen in last 6 months? Yes. Number of falls 1  LIVING ENVIRONMENT: Lives with: lives with their spouse; has caregiver at home; ramp to enter; one level home Lives in: House/apartment Stairs: Yes: Internal: 1 steps; none; family's homes have steps to get in Has following equipment at home: Dan Humphreys - 2 wheeled, Wheelchair (manual), Shower bench, and bed side commode *has used shoe with lift in the distant past for Leg length discrepancy PLOF: Independent using cane  PATIENT GOALS: Wants to work on balance, walking, stairs, to get in and out of a car better to be independent again  (03/29/2023:  to walk with a cane)  OBJECTIVE:     TODAY'S TREATMENT: 04/19/23 Activity Comments                        TODAY'S TREATMENT: 04/12/2023 Activity Comments  Seated leg strengthening BLE: LAQ 3 x 10, 5# Resisted hamstring curls 3 x 10, green band Cues for eccentric control  Supine leg strengthening: SAQ 5#, 3 x 10 reps Supine quad sets 3 x 10, 3 sec Clamshell with resistance 3 x 10   Gait with cane at end of session, 25 ft with min guard assist               Access Code: [email or web address] URL: [email or web address] Date: 03/07/2023 Prepared by: [facility] - Outpatient  Rehab - [facility]  Exercises - Seated Long Arc Quad with Ankle Weight  - 1 x daily - 7 x weekly - 3 sets - 10 reps - Seated Hamstring Curl with Anchored Resistance  - 1 x daily - 7 x weekly - 3 sets - 10 reps - Seated Hip Abduction with Resistance  - 1 x daily - 7 x weekly - 3 sets - 10 reps - Supine Quad Set  - 1 x daily - 7 x weekly - 3 sets - 10 reps   PATIENT EDUCATION: Education details: 04/12/23:  Reviewed seated HEP and reminded pt to include seated hamstring curls (could have family member or caregiver hold theraband) Person educated: Patient Education method: Explanation Education comprehension: verbalized understanding    -------------------------------------------------------------------------------------------------- Objective measures below taken at initial evaluation:  DIAGNOSTIC FINDINGS: CT lumbar spine 12/25/2022 IMPRESSION: 1. Pronounced chronic osteopenia. Prior decompression and fusion throughout the lumbar spine.   2. L3 superior endplate/superior body non-acute fracture AND subtle associated horizontal fracture through the chronic posterior element bone mass there (series 9,  image 53 today). But no displacement over this series of exams. 3. Difficult to exclude nondisplaced S3/S4 sacral fracture, with mild presacral stranding there.   4. But  no other acute osseous abnormality identified. And elsewhere the chronic T12 through sacral ankylosis/arthrodesis appears intact.   5. T10 compression fracture is detailed separately today on CT Thoracic Spine.  COGNITION: Overall cognitive status: Within functional limits for tasks assessed   SENSATION: Light touch: WFL Reports numbness bottom of L foot, sometimes into lateral calf    POSTURE: rounded shoulders, weight shift right, and lateral lean towards R, L shoulder lower than R  LOWER EXTREMITY ROM:   WFL except decreased full Active R hip flexion  LOWER EXTREMITY MMT:    MMT Right Eval Left Eval  Hip flexion 3- 3  Hip extension    Hip abduction    Hip adduction    Hip internal rotation    Hip external rotation    Knee flexion 3+ 3+  Knee extension 3+ 4  Ankle dorsiflexion 3+ 3+  Ankle plantarflexion    Ankle inversion    Ankle eversion    (Blank rows = not tested)   TRANSFERS: Assistive device utilized: Environmental consultant - 2 wheeled  Sit to stand: CGA Stand to sit: CGA  GAIT: Gait pattern: step to pattern, step through pattern, decreased step length- Right, decreased step length- Left, Left hip hike, and knee flexed in stance- Left Distance walked: 35 ft Assistive device utilized: Walker - 2 wheeled Level of assistance: CGA Comments: stops x 1 due to fatigue  FUNCTIONAL TESTS:  10 meter walk test: attempted; unable to complete:  she ambulates 30 ft in 93.6 sec (0.32 ft/sec) Pt stands near walker, no UE support with supervision x 20-30 seconds  TODAY'S TREATMENT:                                                                                                                              DATE:       PATIENT EDUCATION: Education details: Eval results, POC Person educated: Patient and Child(ren) Education method: Explanation Education comprehension: verbalized understanding  HOME EXERCISE PROGRAM: Access Code: [email or web address] URL:  [email or web address] Date: 02/16/2023 Prepared by: Shary Decamp  Exercises - Seated Long Arc Quad with Ankle Weight  - 1 x daily - 7 x weekly - 3 sets - 10 reps - Seated Hamstring Curl with Anchored Resistance  - 1 x daily - 7 x weekly - 3 sets - 10 reps - Seated Hip Abduction with Resistance  - 1 x daily - 7 x weekly - 3 sets - 10 reps  GOALS: Goals reviewed with patient? Yes   SHORT TERM GOALS: (UPDATED) Target date: 05/06/2023  Pt will be independent with progression of HEP for improved strength, balance, gait. Baseline: Goal status: IN PROGRESS  2.  Pt will improve 5x sit<>stand to less than or equal to 15 sec to demonstrate improved functional strength and transfer efficiency. Baseline: 17.22  sec with UE support Goal status: IN PROGRESS  3.  Pt will improve TUG score to less than or equal to 17 sec for decreased fall risk. Baseline: 20.84 sec Goal status: IN PROGRESS   LONG TERM GOALS: UPDATED Target date: 06/03/2023  Pt will verbalize plans for continued community fitness upon d/c from PT to maximize gains made in rehab. Baseline:  Goal status: IN PROGRESS  2.  Pt will improve to 180 ft to demonstrate improved functional strength and transfer efficiency. Baseline: 138 ft  Goal status: IN PROGRESS  3.  Pt will improve TUG score to less than or equal to 14 sec for decreased fall risk. Baseline: 20.84 sec Goal status: IN PROGRESS  4.  Pt will improve gait velocity to at least 2 ft/sec (with cane versus RW) for improved gait efficiency and safety. Baseline: 1.84 ft/sec with RW Goal status: IN PROGRESS  5.  Pt will ambulate at least 200 ft using SPC, mod I for improved household gait. Baseline: TUG with cane 20.84 sec Goal status: IN PROGRESS  6.  Pt will ambulate at least 500 ft with RW for improved community gait. Baseline:  Goal status: IN PROGRESS  ASSESSMENT:  CLINICAL IMPRESSION: Skilled PT session today addressed lower extremity  strengthening, in supine and seated positions, as pt performed functional standing tasks for much of OT session prior to PT.  She does report doing more functional distance walking, multiple times through the day with RW to and from bathroom as well as from car into restaurant.  With seated exercises, she does demo decreased eccentric control with resistance, but improves with cues.  She remains motivated for therapy and will continue to benefit from skilled PT towards updated goals for improved strength, balance, gait.  OBJECTIVE IMPAIRMENTS: Abnormal gait, decreased balance, decreased mobility, difficulty walking, decreased strength, impaired flexibility, postural dysfunction, and pain.   ACTIVITY LIMITATIONS: bending, standing, stairs, transfers, bathing, toileting, dressing, hygiene/grooming, and locomotion level  PARTICIPATION LIMITATIONS: driving, shopping, and community activity  PERSONAL FACTORS: 3+ comorbidities: See PMH  are also affecting patient's functional outcome.   REHAB POTENTIAL: Good  CLINICAL DECISION MAKING: Evolving/moderate complexity  EVALUATION COMPLEXITY: Moderate  PLAN:  PT FREQUENCY: 2x/week  PT DURATION: 8 weeks plus eval week  PLANNED INTERVENTIONS: Therapeutic exercises, Therapeutic activity, Neuromuscular re-education, Balance training, Gait training, Patient/Family education, Self Care, Stair training, Orthotic/Fit training, DME instructions, and Moist heat  PLAN FOR NEXT SESSION: Continue standing balance-EO/EC and varied foot positions.  Continue work on core stability (seated) and strengthening.  Continue to progress HEP for lower extremity strengthening. Continue standing weightshifting activities to increase LLE weightshift in standing.  Work on Journalist, newspaper outdoor gait.  (Maybe consider standing exercises at some point for HEP)

## 2023-04-19 ENCOUNTER — Ambulatory Visit: Payer: Medicare Other | Attending: Physical Medicine & Rehabilitation | Admitting: Occupational Therapy

## 2023-04-19 ENCOUNTER — Encounter: Payer: Self-pay | Admitting: Physical Therapy

## 2023-04-19 ENCOUNTER — Ambulatory Visit: Payer: Medicare Other | Admitting: Physical Therapy

## 2023-04-19 DIAGNOSIS — M6281 Muscle weakness (generalized): Secondary | ICD-10-CM | POA: Insufficient documentation

## 2023-04-19 DIAGNOSIS — R2681 Unsteadiness on feet: Secondary | ICD-10-CM | POA: Insufficient documentation

## 2023-04-19 DIAGNOSIS — R2689 Other abnormalities of gait and mobility: Secondary | ICD-10-CM

## 2023-04-19 NOTE — Therapy (Signed)
OUTPATIENT OCCUPATIONAL THERAPY NEURO  Treatment Note  Patient Name: [name] MRN: [id number] DOB:[birth year], 67 y.o., female Today's Date: 04/19/2023  PCP: Aliene Beams, MD REFERRING PROVIDER: Fanny Dance, MD  END OF SESSION:  OT End of Session - 04/19/23 1125     Visit Number 5    Number of Visits 17    Date for OT Re-Evaluation 05/13/23    Authorization Type [employer] Medicare    OT Start Time 1020    OT Stop Time 1102    OT Time Calculation (min) 42 min                 Past Medical History:  Diagnosis Date   Anemia    Anxiety    Arthritis    Bipolar disorder (HCC)    Depression    GERD (gastroesophageal reflux disease)    Heart murmur    "related to VSD"   High cholesterol    History of blood transfusion    "related to OR" (08/19/2016)   History of hiatal hernia    Hypertension    Hyperthyroidism    Mild cognitive impairment 09/06/2018   Paroxysmal ventricular tachycardia (HCC)    Type II diabetes mellitus (HCC)    UTI (urinary tract infection)    being treated with Keflex   Ventricular septal defect    Ventriculitis of brain due to bacteria 11/02/2022   Vertebral fracture, osteoporotic (HCC) 01/06/2023   Past Surgical History:  Procedure Laterality Date   ABDOMINAL HYSTERECTOMY     BACK SURGERY     CARDIAC CATHETERIZATION N/A 06/23/2015   Procedure: Left Heart Cath and Coronary Angiography;  Surgeon: Laurey Morale, MD;  Location: [facility] INVASIVE CV LAB;  Service: Cardiovascular;  Laterality: N/A;   CARDIAC CATHETERIZATION  1960   "VSD was so small; didn't need repaired"   EXAM UNDER ANESTHESIA WITH MANIPULATION OF HIP Right 06/02/2014   dr Jerl Santos   FRACTURE SURGERY     HERNIA REPAIR     HIP CLOSED REDUCTION Right 06/02/2014   Procedure: CLOSED MANIPULATION HIP;  Surgeon: Velna Ochs, MD;  Location: [facility] OR;  Service: Orthopedics;  Laterality: Right;   JOINT REPLACEMENT     JOINT REPLACEMENT     POSTERIOR LUMBAR FUSION 4 LEVEL  N/A 06/26/2022   Procedure: Lumbar One To Lumbar Five Posterior Instrumented Fusion;  Surgeon: Jadene Pierini, MD;  Location: [facility] OR;  Service: Neurosurgery;  Laterality: N/A;   REFRACTIVE SURGERY Bilateral    SHOULDER ARTHROSCOPY Right    SHOULDER OPEN ROTATOR CUFF REPAIR Right    SPINAL FUSION  1996   "t10 down to my coccyx   SPINE HARDWARE REMOVAL     TOTAL ABDOMINAL HYSTERECTOMY     TOTAL HIP ARTHROPLASTY Right 05/10/2014   [email or web address]      by dr Cristal Deer olcott   TOTAL KNEE ARTHROPLASTY Left    TOTAL SHOULDER ARTHROPLASTY Left 08/19/2016   Procedure: TOTAL SHOULDER ARTHROPLASTY;  Surgeon: Jones Broom, MD;  Location: [facility] OR;  Service: Orthopedics;  Laterality: Left;  Left total shoulder replacement   Patient Active Problem List   Diagnosis Date Noted   Nonobstructive atherosclerosis of coronary artery 02/10/2023   Vertebral fracture, osteoporotic (HCC) 01/06/2023   Ventriculitis of brain due to bacteria 11/02/2022   Anemia of chronic illness 10/12/2022   Bipolar disorder, in partial remission, most recent episode depressed (HCC) 10/07/2022   Adjustment disorder with mixed anxiety and depressed mood 09/30/2022   Chronic wound 09/25/2022  Hyperglycemia 09/25/2022   Chronic midline thoracic back pain 09/17/2022   Primary insomnia 09/17/2022   Chronic combined systolic and diastolic CHF (congestive heart failure) (HCC) 09/14/2022   Acute blood loss anemia 09/09/2022   Infection of lumbar spine (HCC) 09/03/2022   Malnutrition of moderate degree 08/20/2022   Hardware complicating wound infection (HCC) 08/20/2022   Infection of deep incisional surgical site after procedure 08/20/2022   Aspiration pneumonia of both lungs (HCC) 08/19/2022   Acute respiratory failure with hypoxia (HCC) 08/19/2022   Sepsis (HCC) 08/19/2022   Seizure (HCC) 08/19/2022   Wound dehiscence 08/09/2022   Delayed surgical wound healing 07/08/2022   Fracture of lumbar spine without cord injury (HCC)  06/26/2022   Lumbar vertebral fracture (HCC) 06/25/2022   Mood disorder (HCC) 09/29/2021   Mild cognitive impairment 09/06/2018   Hyponatremia 02/28/2018   S/P shoulder replacement, left 08/19/2016   Anxiety 10/17/2014   Acid reflux 10/17/2014   BP (high blood pressure) 10/17/2014   Arthritis, degenerative 10/17/2014   Adult hypothyroidism 10/17/2014   UTI (urinary tract infection) 06/03/2014   Fracture of bone adjacent to prosthesis 06/03/2014   Diabetes (HCC) 06/02/2014   Peri-prosthetic fracture of femur following total hip arthroplasty 06/02/2014   Urinary retention 05/13/2014   Chronic pain 05/10/2014   History of hip surgery 05/10/2014   UNSPECIFIED HEART FAILURE 06/24/2010   UNSPECIFIED CONGENITAL DEFECT OF SEPTAL CLOSURE 06/24/2010   TOBACCO ABUSE 03/18/2010   Secondary cardiomyopathy (HCC) 03/18/2010   DM 06/13/2009   HYPERTENSION, UNSPECIFIED 06/13/2009   VENTRICULAR TACHYCARDIA 06/13/2009   VENTRICULAR SEPTAL DEFECT, CONGENITAL 06/13/2009   Diabetes mellitus, type 2 (HCC) 06/13/2009   Essential (primary) hypertension 06/13/2009    ONSET DATE: 08/09/22 (referral 02/24/23)  REFERRING DIAG: M62.81 (ICD-10-CM) - Muscle weakness (generalized) MD requesting plan to address upper body strenghtening and full ADL participation  THERAPY DIAG:  Muscle weakness (generalized)  Unsteadiness on feet  Rationale for Evaluation and Treatment: Rehabilitation  SUBJECTIVE:   SUBJECTIVE STATEMENT: Pt reports difficulty falling asleep last night, due to back pain, but was eventually able to. Pt accompanied by: self and PCA (who dropped her off at appt)  PERTINENT HISTORY: 67 year old female with history of PVT, T2DM, small membranous VSD, depression, bipolar d/o, mild cognitive impairment,, fall 05/2022 with 3 column unstable fracture through prior scoliosis construct s/p instrumentation with exposed hardware and wound dehiscence. She was admitted on 08/09/2022 for revision of complex  lumbar wound, removal of L1-L5 lumbar screws and rods with revision L1-L5 posterior fusion by Dr. Johnsie Cancel.    Postop had issues with fatigue cough, hypotension as well as confusion overnight on 01/24. BLE Dopplers ordered for follow-up on 01/22 and was negative for DVT and showed hypoechoic collection in left popliteal fossa.  Foley placed due to ongoing issues with urinary retention.  Due to ongoing confusion and lethargy, antibiotics added due to concerns of UTI and oxycodone dose decreased due to concerns of side effects.  She started developing drainage from her wound on 01/28 as well as watery diarrhea and stool was negative for C. difficile. [name] consulted due to worsening of respiratory status due to sepsis from acute meningitis and asked patient pneumonia and she required intubation on 02/01.  Neurology consulted due to concerns of seizures and EEG showed moderate diffuse encephalopathy.  MRI brain done showing punctate foci of acute ischemia right frontal lobe and diffusion abnormality within the occipital horn of left lateral ventricle with proteinaceous/purulent debris.  MRI spine showed L2-L5 fusion widely  patent with decrease in bone marrow edema L3.  PRECAUTIONS: Back and Fall (no twisting, minimal bending)  and Other: hx of osteoporosis and fractures ; avoid twisting; avoid excess bending; is to log roll for bed mobility; (thoracic) wound being followed by wound care.   WEIGHT BEARING RESTRICTIONS: No  PAIN:  Are you having pain? Yes: NPRS scale: 5/10 Pain location: lower back, around fusion site Pain description: aching Aggravating factors: certain movements Relieving factors: flexeril  FALLS: Has patient fallen in last 6 months? Yes. Number of falls 1, fell the day after she got home.    LIVING ENVIRONMENT: Lives with: lives with their spouse, PCA 8:30- 4 Mon- Fri Lives in: House/apartment Stairs: Yes: External: 1 steps; caregiver/family bumping pt up the platform step Has  following equipment at home: Dan Humphreys - 2 wheeled, Environmental consultant - 4 wheeled, Wheelchair (manual), Shower bench, bed side commode, and Grab bars  PLOF: Independent and Independent with basic ADLs  PATIENT GOALS: "I want to be able to [place]"  OBJECTIVE:   HAND DOMINANCE: Right  ADLs: Transfers/ambulation related to ADLs: CGA - Supervision with RW, stand pivot transfers with and without RW with close supervision Grooming: Mod I, washing face standing and then sitting to put on makeup UB Dressing: Setup/supervision LB Dressing: needs assist with shoes and pants with RLE Toileting: BSC with handles, Supervision for clothing management and hygiene at sit > stand level Bathing: uses walk-in shower in master bathroom, bathing at sit > stand level Tub Shower transfers: close supervision, stabilizing RW when stepping over walk-in shower ledge Equipment: Grab bars, Walk in shower, bed side commode, and built in shower seat, walk-in shower  IADLs: Meal Prep: has cooked a chicken dish and baked spaghetti, standing to retreive items but then sitting for majority of meal prep.  Does have concerns with retrieving items from upper and lower cabinets, refrigerator.  Difficulty with opening jars/containers Medication management: daughter fills up pill box and pt/husband/caregivers assisting as needed  MOBILITY STATUS: Needs Assist: min guard - supervision ambulatory transfers, Supervision - Mod I stand pivot from w/c  POSTURE COMMENTS:  rounded shoulders, weight shift right, and lateral lean towards R, L shoulder lower than R   ACTIVITY TOLERANCE: Activity tolerance: WFL for tasks assessed on eval  UPPER EXTREMITY ROM:  shoulder flexion with compensatory abduction but able to reach [facility], pt also with full ROM with internal/external rotation  UPPER EXTREMITY MMT:   grossly 3+ to 4/5 bilaterally   HAND FUNCTION: 04/19/23 Grip: R: 34, L: 32  Grip strength: Right: 22 lbs;  Left: 30 lbs and Tip pinch: Right 8 lbs, Left: 7 lbs  COORDINATION: 9 Hole Peg test: Right: 21.97 sec; Left: 25.69 sec  SENSATION: R foot is still numb on the bottom, BUE sensation intact  COGNITION: Overall cognitive status: Within functional limits for tasks assessed   TODAY'S TREATMENT:                                                                                          DATE:  04/19/23 Dynamic standing/reaching: engaged in standing at counter top with focus  on dynamic standing as needed for ADLs and IADLs.  Pt reaching for sticky notes to challenge weight shifting and standing tolerance.   Hand gripper: with RUE on level 25# with yellow spring, increased to 30# with green spring. Pt picked up 1 inch blocks with gripper with no drops with yellow and one drop with green spring.  Pt completed with 25# and 30# on L with min difficulty and one drop with 30# spring.   04/12/23 Transfers: pt completed short distance ambulation transfer w/c to mat table with close supervision.  Meal prep: pt reports getting all her ingredients and spices and standing when browning meat.  Pt reports having to bend down to get a pot and wonders if there is a better way to complete. LB dressing: simulated LB dressing with gait belt with pt able to lift each leg and advance belt under foot.  Pt then demonstrating sit > stand to pull "pants" over hips with supervision.  Pipe tree puzzle:  engaged in standing 4 mins and 5 mins while completing pipe tree puzzle to simulate carry over to dynamic standing as needed for self-care and meal prep tasks.  Pt demonstrating weight shifting and seated rest breaks as needed due to increase in pain in back with prolonged standing.   04/07/23 LB dressing: pt reports sitting in a lower chair in her bedroom to allow for increased ease with donning underwear and shorts.  Pt reports attempting basket for step stool but reports that the basket was too tall. Hand Gripper: with RUE on  level 20# with yellow spring. Pt picked up 1 inch blocks with gripper with min difficulty, increasing drops and difficulty with repetition and onset of fatigue.  Completed with LUE on level 25# with yellow spring. Pt picked up 1 inch blocks with gripper with fewer drops than RUE, however still with reports of fatigue and increased difficulty with repetition. UE strengthening: OT instructed in horizontal abduction, diagonal reach, and scapular retraction with red theraband.  OT providing min cues for technique and positioning to increase ROM and facilitate increased strengthening.     03/29/23 Sleeping positions: Pt reports not able to sleep last night due to increased pain after doing some meal prep.  Pt did report good use of energy conservation strategies with sitting during cutting items, but did do some standing with UE pressure through arms on RW.  Discussed pillow placement and bent knees for additional offloading of pressure and lumbar spine, pt plans to attempt further repositioning tonight. LB dressing: educated on variety of sitting postures and positioning to increase ease and independence with LB dressing.  Pt with difficulty lifting RLE, however able to place B feet onto 4" step.  Pt then demonstrating increased ease with reaching towards feet as needed for donning and tying shoes.  Discussed options to recreate position at home. RUE strengthening: OT instructed pt in various grip and pinch exercises as well as thumb flexion and extension.  Pt continue to report difficulty with strength and coordination with R hand during functional tasks. Exercises - Putty Squeezes  - Tip Pinch with Putty  - 3-Point Pinch with Putty   - Key Pinch with Putty   - Thumb MCP and IP Flexion with Putty   - Thumb Radial Abduction with Putty Loop     PATIENT EDUCATION: Education details: ongoing condition specific education and modification to increase ease with LB dressing Person educated:  Patient Education method: Explanation Education comprehension: verbalized understanding and needs further education  HOME EXERCISE PROGRAM: Access Code: [email or web address] URL: [email or web address] Date: 03/29/2023 Prepared by: [facility] - Outpatient  Rehab - [facility]  Exercises - Putty Squeezes  - 1 x daily - 1 sets - 10 reps - Tip Pinch with Putty  - 1 x daily - 1 sets - 10 reps - 3-Point Pinch with Putty  - 1 x daily - 1 sets - 10 reps - Key Pinch with Putty  - 1 x daily - 1 sets - 10 reps - Thumb MCP and IP Flexion with Putty  - 1 x daily - 1 sets - 10 reps - Thumb Radial Abduction with Putty Loop  - 1 x daily - 7 x weekly - 1 sets - 10 reps  Access Code: [email or web address] URL: [email or web address] Date: 04/07/2023 Prepared by: [facility] - Outpatient  Rehab - [facility]  Exercises - Seated Shoulder Horizontal Abduction with Resistance  - 1-2 x daily - 1 sets - 10 reps - Seated Diagonal Reach with Resistance  - 1-2 x daily - 1 sets - 10 reps - Scapular Retraction with Resistance  - 1-2 x daily - 1 sets - 10 reps   GOALS: Goals reviewed with patient? Yes  SHORT TERM GOALS: Target date: 04/15/23  Pt will be independent in BUE strengthening HEP. Baseline: Goal status: IN PROGRESS  2.  Pt will verbalize understanding of task modifications and/or potential AE needs to increase ease, safety, and independence w/ ADLs. Baseline:  Goal status: MET - 04/19/23  3.  Pt will demonstrate improved grip strength by 4# with RUE to increase success with opening food containers. Baseline: R:22# and L: 30# Goal status: MET - R: 34# on 04/19/23    LONG TERM GOALS: Target date: 05/13/23  Pt will be able to complete LB dressing at Mod I level utilizing compensatory strategies and/or AE as needed. Baseline:  Goal status: IN PROGRESS  2.  Pt will demonstrate improved ease with stand pivot and short distance ambulatory transfers to access bathroom toilet and  in/out of car at Mod I level. Baseline:  Goal status: IN PROGRESS  3.  Pt will verbalize and/or demonstrate safe techniques to retreive items from floor when dropped. Baseline:  Goal status: IN PROGRESS  4.  Pt will demonstrate improved balance and endurance to maintain dynamic standing balance for 10 mins to complete ADLs and/or IADLs without needing seated rest break. Baseline:  Goal status: IN PROGRESS  5.  Pt will be able to demonstrate improved standing balance and positioning of RW as need to retreive items from upper and lower cabinets and refrigerator at Mod I level. Baseline:  Goal status: IN PROGRESS  6.  Pt will demonstrate improved UE and grip strength as needed to engage in functional meal prep tasks (open containers and sustain grasp on items during meal prep). Baseline:  Goal status: IN PROGRESS  ASSESSMENT:  CLINICAL IMPRESSION: Pt demonstrating increased ease with sit > stand and dynamic standing this session as well as increased grip strength bilaterally.  Pt with minimal c/o pain during dynamic standing this session.  PERFORMANCE DEFICITS: in functional skills including ADLs, IADLs, ROM, strength, pain, Gross motor control, balance, body mechanics, endurance, decreased knowledge of precautions, decreased knowledge of use of DME, and UE functional use and psychosocial skills including coping strategies and environmental adaptation.     PLAN:  OT FREQUENCY: 2x/week  OT DURATION: 8 weeks  PLANNED INTERVENTIONS: self care/ADL training, therapeutic exercise, therapeutic activity, neuromuscular re-education, balance training, functional mobility training, moist  heat, cryotherapy, patient/family education, psychosocial skills training, energy conservation, coping strategies training, and DME and/or AE instructions  RECOMMENDED OTHER SERVICES: NA  CONSULTED AND AGREED WITH PLAN OF CARE: Patient  PLAN FOR NEXT SESSION: Transfers and dynamic standing/reaching.   Continue to address possible modifications for LB dressing; UB strengthening - try weighted dowel   Rosalio Loud, OTR/L 04/19/2023, 11:26 AM   [facility] at [facility] [address] [place], [address] [place], [place] [address] Phone # [phone] Fax # [phone]

## 2023-04-21 ENCOUNTER — Ambulatory Visit: Payer: Medicare Other | Admitting: Occupational Therapy

## 2023-04-21 ENCOUNTER — Ambulatory Visit: Payer: Medicare Other

## 2023-04-21 DIAGNOSIS — R2689 Other abnormalities of gait and mobility: Secondary | ICD-10-CM

## 2023-04-21 DIAGNOSIS — M6281 Muscle weakness (generalized): Secondary | ICD-10-CM

## 2023-04-21 DIAGNOSIS — R2681 Unsteadiness on feet: Secondary | ICD-10-CM

## 2023-04-21 NOTE — Therapy (Addendum)
 OUTPATIENT OCCUPATIONAL THERAPY NEURO  Treatment Note  Patient Name: [name] MRN: [id number] DOB:1955-10-21, 67 y.o., female Today's Date: 04/21/2023  PCP: Aliene Beams, MD REFERRING PROVIDER: Fanny Dance, MD  END OF SESSION:  OT End of Session - 04/21/23 1557     Visit Number 6    Number of Visits 17    Date for OT Re-Evaluation 05/13/23    Authorization Type BCBS Medicare    OT Start Time 1148    OT Stop Time 1230    OT Time Calculation (min) 42 min                  Past Medical History:  Diagnosis Date   Anemia    Anxiety    Arthritis    Bipolar disorder (HCC)    Depression    GERD (gastroesophageal reflux disease)    Heart murmur    "related to VSD"   High cholesterol    History of blood transfusion    "related to OR" (08/19/2016)   History of hiatal hernia    Hypertension    Hyperthyroidism    Mild cognitive impairment 09/06/2018   Paroxysmal ventricular tachycardia (HCC)    Type II diabetes mellitus (HCC)    UTI (urinary tract infection)    being treated with Keflex   Ventricular septal defect    Ventriculitis of brain due to bacteria 11/02/2022   Vertebral fracture, osteoporotic (HCC) 01/06/2023   Past Surgical History:  Procedure Laterality Date   ABDOMINAL HYSTERECTOMY     BACK SURGERY     CARDIAC CATHETERIZATION N/A 06/23/2015   Procedure: Left Heart Cath and Coronary Angiography;  Surgeon: Laurey Morale, MD;  Location: [facility] INVASIVE CV LAB;  Service: Cardiovascular;  Laterality: N/A;   CARDIAC CATHETERIZATION  1960   "VSD was so small; didn't need repaired"   EXAM UNDER ANESTHESIA WITH MANIPULATION OF HIP Right 06/02/2014   dr Jerl Santos   FRACTURE SURGERY     HERNIA REPAIR     HIP CLOSED REDUCTION Right 06/02/2014   Procedure: CLOSED MANIPULATION HIP;  Surgeon: Velna Ochs, MD;  Location: [facility] OR;  Service: Orthopedics;  Laterality: Right;   JOINT REPLACEMENT     JOINT REPLACEMENT     POSTERIOR LUMBAR FUSION 4  LEVEL N/A 06/26/2022   Procedure: Lumbar One To Lumbar Five Posterior Instrumented Fusion;  Surgeon: Jadene Pierini, MD;  Location: [facility] OR;  Service: Neurosurgery;  Laterality: N/A;   REFRACTIVE SURGERY Bilateral    SHOULDER ARTHROSCOPY Right    SHOULDER OPEN ROTATOR CUFF REPAIR Right    SPINAL FUSION  1996   "t10 down to my coccyx   SPINE HARDWARE REMOVAL     TOTAL ABDOMINAL HYSTERECTOMY     TOTAL HIP ARTHROPLASTY Right 05/10/2014   [email or web address]      by dr Cristal Deer olcott   TOTAL KNEE ARTHROPLASTY Left    TOTAL SHOULDER ARTHROPLASTY Left 08/19/2016   Procedure: TOTAL SHOULDER ARTHROPLASTY;  Surgeon: Jones Broom, MD;  Location: [facility] OR;  Service: Orthopedics;  Laterality: Left;  Left total shoulder replacement   Patient Active Problem List   Diagnosis Date Noted   Nonobstructive atherosclerosis of coronary artery 02/10/2023   Vertebral fracture, osteoporotic (HCC) 01/06/2023   Ventriculitis of brain due to bacteria 11/02/2022   Anemia of chronic illness 10/12/2022   Bipolar disorder, in partial remission, most recent episode depressed (HCC) 10/07/2022   Adjustment disorder with mixed anxiety and depressed mood 09/30/2022   Chronic wound 09/25/2022  Hyperglycemia 09/25/2022   Chronic midline thoracic back pain 09/17/2022   Primary insomnia 09/17/2022   Chronic combined systolic and diastolic CHF (congestive heart failure) (HCC) 09/14/2022   Acute blood loss anemia 09/09/2022   Infection of lumbar spine (HCC) 09/03/2022   Malnutrition of moderate degree 08/20/2022   Hardware complicating wound infection (HCC) 08/20/2022   Infection of deep incisional surgical site after procedure 08/20/2022   Aspiration pneumonia of both lungs (HCC) 08/19/2022   Acute respiratory failure with hypoxia (HCC) 08/19/2022   Sepsis (HCC) 08/19/2022   Seizure (HCC) 08/19/2022   Wound dehiscence 08/09/2022   Delayed surgical wound healing 07/08/2022   Fracture of lumbar spine without cord injury  (HCC) 06/26/2022   Lumbar vertebral fracture (HCC) 06/25/2022   Mood disorder (HCC) 09/29/2021   Mild cognitive impairment 09/06/2018   Hyponatremia 02/28/2018   S/P shoulder replacement, left 08/19/2016   Anxiety 10/17/2014   Acid reflux 10/17/2014   BP (high blood pressure) 10/17/2014   Arthritis, degenerative 10/17/2014   Adult hypothyroidism 10/17/2014   UTI (urinary tract infection) 06/03/2014   Fracture of bone adjacent to prosthesis 06/03/2014   Diabetes (HCC) 06/02/2014   Peri-prosthetic fracture of femur following total hip arthroplasty 06/02/2014   Urinary retention 05/13/2014   Chronic pain 05/10/2014   History of hip surgery 05/10/2014   UNSPECIFIED HEART FAILURE 06/24/2010   UNSPECIFIED CONGENITAL DEFECT OF SEPTAL CLOSURE 06/24/2010   TOBACCO ABUSE 03/18/2010   Secondary cardiomyopathy (HCC) 03/18/2010   DM 06/13/2009   HYPERTENSION, UNSPECIFIED 06/13/2009   VENTRICULAR TACHYCARDIA 06/13/2009   VENTRICULAR SEPTAL DEFECT, CONGENITAL 06/13/2009   Diabetes mellitus, type 2 (HCC) 06/13/2009   Essential (primary) hypertension 06/13/2009    ONSET DATE: 08/09/22 (referral 02/24/23)  REFERRING DIAG: M62.81 (ICD-10-CM) - Muscle weakness (generalized) MD requesting plan to address upper body strenghtening and full ADL participation  THERAPY DIAG:  Muscle weakness (generalized)  Unsteadiness on feet  Rationale for Evaluation and Treatment: Rehabilitation  SUBJECTIVE:   SUBJECTIVE STATEMENT: Pt reports things are going "pretty good" but did report that she was up midnight to 3 am. Pt accompanied by: self and daughter, [name] HISTORY: 67 year old female with history of PVT, T2DM, small membranous VSD, depression, bipolar d/o, mild cognitive impairment,, fall 05/2022 with 3 column unstable fracture through prior scoliosis construct s/p instrumentation with exposed hardware and wound dehiscence. She was admitted on 08/09/2022 for revision of complex lumbar wound,  removal of L1-L5 lumbar screws and rods with revision L1-L5 posterior fusion by Dr. Johnsie [name].    Postop had issues with fatigue cough, hypotension as well as confusion overnight on 01/24. BLE Dopplers ordered for follow-up on 01/22 and was negative for DVT and showed hypoechoic collection in left popliteal fossa.  Foley placed due to ongoing issues with urinary retention.  Due to ongoing confusion and lethargy, antibiotics added due to concerns of UTI and oxycodone dose decreased due to concerns of side effects.  She started developing drainage from her wound on 01/28 as well as watery diarrhea and stool was negative for C. difficile. [name] consulted due to worsening of respiratory status due to sepsis from acute meningitis and asked patient pneumonia and she required intubation on 02/01.  Neurology consulted due to concerns of seizures and EEG showed moderate diffuse encephalopathy.  MRI brain done showing punctate foci of acute ischemia right frontal lobe and diffusion abnormality within the occipital horn of left lateral ventricle with proteinaceous/purulent debris.  MRI spine showed L2-L5 fusion widely patent with decrease  in bone marrow edema L3.  PRECAUTIONS: Back and Fall (no twisting, minimal bending)  and Other: hx of osteoporosis and fractures ; avoid twisting; avoid excess bending; is to log roll for bed mobility; (thoracic) wound being followed by wound care.   WEIGHT BEARING RESTRICTIONS: No  PAIN:  Are you having pain? Yes: NPRS scale: 5/10 Pain location: lower back, around fusion site Pain description: aching Aggravating factors: certain movements Relieving factors: flexeril  FALLS: Has patient fallen in last 6 months? Yes. Number of falls 1, fell the day after she got home.    LIVING ENVIRONMENT: Lives with: lives with their spouse, PCA 8:30- 4 Mon- Fri Lives in: House/apartment Stairs: Yes: External: 1 steps; caregiver/family bumping pt up the platform step Has following  equipment at home: Dan Humphreys - 2 wheeled, Environmental consultant - 4 wheeled, Wheelchair (manual), Shower bench, bed side commode, and Grab bars  PLOF: Independent and Independent with basic ADLs  PATIENT GOALS: "I want to be able to [place]"  OBJECTIVE:   HAND DOMINANCE: Right  ADLs: Transfers/ambulation related to ADLs: CGA - Supervision with RW, stand pivot transfers with and without RW with close supervision Grooming: Mod I, washing face standing and then sitting to put on makeup UB Dressing: Setup/supervision LB Dressing: needs assist with shoes and pants with RLE Toileting: BSC with handles, Supervision for clothing management and hygiene at sit > stand level Bathing: uses walk-in shower in master bathroom, bathing at sit > stand level Tub Shower transfers: close supervision, stabilizing RW when stepping over walk-in shower ledge Equipment: Grab bars, Walk in shower, bed side commode, and built in shower seat, walk-in shower  IADLs: Meal Prep: has cooked a chicken dish and baked spaghetti, standing to retreive items but then sitting for majority of meal prep.  Does have concerns with retrieving items from upper and lower cabinets, refrigerator.  Difficulty with opening jars/containers Medication management: daughter fills up pill box and pt/husband/caregivers assisting as needed  MOBILITY STATUS: Needs Assist: min guard - supervision ambulatory transfers, Supervision - Mod I stand pivot from w/c  POSTURE COMMENTS:  rounded shoulders, weight shift right, and lateral lean towards R, L shoulder lower than R   ACTIVITY TOLERANCE: Activity tolerance: WFL for tasks assessed on eval  UPPER EXTREMITY ROM:  shoulder flexion with compensatory abduction but able to reach [facility], pt also with full ROM with internal/external rotation  UPPER EXTREMITY MMT:   grossly 3+ to 4/5 bilaterally   HAND FUNCTION: 04/19/23 Grip: R: 34, L: 32  Grip strength: Right: 22 lbs; Left: 30  lbs and Tip pinch: Right 8 lbs, Left: 7 lbs  COORDINATION: 9 Hole Peg test: Right: 21.97 sec; Left: 25.69 sec  SENSATION: R foot is still numb on the bottom, BUE sensation intact  COGNITION: Overall cognitive status: Within functional limits for tasks assessed   TODAY'S TREATMENT:                                                                                          DATE:  04/21/23 Block pushups: engaged in seated pushups from yoga blocks with focus on BUE  strengthening and UB control.  OT stabilizing blocks and providing encouragement for increased pushup, incorporating toe touch for increased stability.  Completed 10x3 with rest breaks in between sets. Dynamic reaching: engaged in reaching in sitting to retrieve items when dropped.  OT providing education on recommendations for "hinging" vs bending as well as staggered foot position for improved BOS when standing incorporating squatting vs bending.  Discussed as she transfers to Rollator sitting on walker seat when retrieving items from floor to decrease fall risk.  OT educated on carryover to picking up and/or donning shoes.  Pt reports that she has been able to don and tie shoes for last few days! Dynamic standing: engaged in standing at table top while stacking and unstacking cups, incorporating weight shift and challenging standing tolerance.  Pt requiring weight shift between legs due to pressure on lower back with increased standing.   04/19/23 Dynamic standing/reaching: engaged in standing at counter top with focus on dynamic standing as needed for ADLs and IADLs.  Pt reaching for sticky notes to challenge weight shifting and standing tolerance.   Hand gripper: with RUE on level 25# with yellow spring, increased to 30# with green spring. Pt picked up 1 inch blocks with gripper with no drops with yellow and one drop with green spring.  Pt completed with 25# and 30# on L with min difficulty and one drop with 30#  spring.   04/12/23 Transfers: pt completed short distance ambulation transfer w/c to mat table with close supervision.  Meal prep: pt reports getting all her ingredients and spices and standing when browning meat.  Pt reports having to bend down to get a pot and wonders if there is a better way to complete. LB dressing: simulated LB dressing with gait belt with pt able to lift each leg and advance belt under foot.  Pt then demonstrating sit > stand to pull "pants" over hips with supervision.  Pipe tree puzzle:  engaged in standing 4 mins and 5 mins while completing pipe tree puzzle to simulate carry over to dynamic standing as needed for self-care and meal prep tasks.  Pt demonstrating weight shifting and seated rest breaks as needed due to increase in pain in back with prolonged standing.    PATIENT EDUCATION: Education details: ongoing condition specific education and modification to increase ease with LB dressing Person educated: Patient Education method: Explanation Education comprehension: verbalized understanding and needs further education  HOME EXERCISE PROGRAM: Access Code: [email or web address] URL: [email or web address] Date: 03/29/2023 Prepared by: [facility] - Outpatient  Rehab - [facility]  Exercises - Putty Squeezes  - 1 x daily - 1 sets - 10 reps - Tip Pinch with Putty  - 1 x daily - 1 sets - 10 reps - 3-Point Pinch with Putty  - 1 x daily - 1 sets - 10 reps - Key Pinch with Putty  - 1 x daily - 1 sets - 10 reps - Thumb MCP and IP Flexion with Putty  - 1 x daily - 1 sets - 10 reps - Thumb Radial Abduction with Putty Loop  - 1 x daily - 7 x weekly - 1 sets - 10 reps  Access Code: [email or web address] URL: [email or web address] Date: 04/07/2023 Prepared by: [facility] - Outpatient  Rehab - [facility]  Exercises - Seated Shoulder Horizontal Abduction with Resistance  - 1-2 x daily - 1 sets - 10 reps - Seated Diagonal Reach with Resistance  - 1-2 x daily  - 1 sets - 10 reps -  Scapular Retraction with Resistance  - 1-2 x daily - 1 sets - 10 reps   GOALS: Goals reviewed with patient? Yes  SHORT TERM GOALS: Target date: 04/15/23  Pt will be independent in BUE strengthening HEP. Baseline: Goal status: IN PROGRESS  2.  Pt will verbalize understanding of task modifications and/or potential AE needs to increase ease, safety, and independence w/ ADLs. Baseline:  Goal status: MET - 04/19/23  3.  Pt will demonstrate improved grip strength by 4# with RUE to increase success with opening food containers. Baseline: R:22# and L: 30# Goal status: MET - R: 34# on 04/19/23    LONG TERM GOALS: Target date: 05/13/23  Pt will be able to complete LB dressing at Mod I level utilizing compensatory strategies and/or AE as needed. Baseline:  Goal status: IN PROGRESS  2.  Pt will demonstrate improved ease with stand pivot and short distance ambulatory transfers to access bathroom toilet and in/out of car at Mod I level. Baseline:  Goal status: IN PROGRESS  3.  Pt will verbalize and/or demonstrate safe techniques to retreive items from floor when dropped. Baseline:  Goal status: IN PROGRESS  4.  Pt will demonstrate improved balance and endurance to maintain dynamic standing balance for 10 mins to complete ADLs and/or IADLs without needing seated rest break. Baseline:  Goal status: IN PROGRESS  5.  Pt will be able to demonstrate improved standing balance and positioning of RW as need to retreive items from upper and lower cabinets and refrigerator at Mod I level. Baseline:  Goal status: IN PROGRESS  6.  Pt will demonstrate improved UE and grip strength as needed to engage in functional meal prep tasks (open containers and sustain grasp on items during meal prep). Baseline:  Goal status: IN PROGRESS  ASSESSMENT:  CLINICAL IMPRESSION: Pt demonstrating increased ease with sit > stand and dynamic standing this session.  Pt receptive to education on  modified positioning and modified techniques to increase safety when reaching towards floor for LB dressing and/or if to retreive dropped items.  Pt with minimal c/o pain during dynamic sitting and standing this session.  PERFORMANCE DEFICITS: in functional skills including ADLs, IADLs, ROM, strength, pain, Gross motor control, balance, body mechanics, endurance, decreased knowledge of precautions, decreased knowledge of use of DME, and UE functional use and psychosocial skills including coping strategies and environmental adaptation.     PLAN:  OT FREQUENCY: 2x/week  OT DURATION: 8 weeks  PLANNED INTERVENTIONS: self care/ADL training, therapeutic exercise, therapeutic activity, neuromuscular re-education, balance training, functional mobility training, moist heat, cryotherapy, patient/family education, psychosocial skills training, energy conservation, coping strategies training, and DME and/or AE instructions  RECOMMENDED OTHER SERVICES: NA  CONSULTED AND AGREED WITH PLAN OF CARE: Patient  PLAN FOR NEXT SESSION: Transfers and dynamic standing/reaching.  Continue to address possible modifications for LB dressing; UB strengthening - try weighted dowel   Rosalio Loud, OTR/L 04/21/2023, 3:57 PM   [facility] at [facility] [address] [place], [place] [address] Phone # [phone] Fax # [phone]    OCCUPATIONAL THERAPY DISCHARGE SUMMARY  Visits from Start of Care: 6  Current functional level related to goals / functional outcomes: Unsure as pt did not return for additional therapy appts.  Upon last visit, pt was demonstrating improved grip strength, standing tolerance, and understanding of adaptive techniques to increase ease and independence with reaching towards floor as needed for LB dressing.  Pt initially cancelled appts due to  being sick with Covid.  Upon return to PT, pt reporting increased back pain limiting mobility and PT  recommending f/u with MD prior to resuming any further therapy.  Due to inactivity, will d/c from OT services.   Remaining deficits: Decreased standing tolerance, back pain   Education / Equipment: Energy conservation, adaptive techniques/equipment, strengthening   Patient agrees to discharge. Patient goals were not met. Patient is being discharged due to not returning since the last visit.  Rosalio Loud, OT 09/26/23

## 2023-04-21 NOTE — Therapy (Signed)
OUTPATIENT PHYSICAL THERAPY NEURO TREATMENT   Patient Name: [name] MRN: [id number] DOB:1956/04/24, 67 y.o., female Today's Date: 04/21/2023   PCP: Aliene Beams, MD REFERRING PROVIDER: Fanny Dance, MD    END OF SESSION:  PT End of Session - 04/21/23 1104     Visit Number 16    Number of Visits 29    Date for PT Re-Evaluation 06/03/23   per recert 04/07/2023   Authorization Type BCBS Medicare    Progress Note Due on Visit --   (PN completed at visit 9)   PT Start Time 1103    PT Stop Time 1145    PT Time Calculation (min) 42 min    Equipment Utilized During Treatment Gait belt    Activity Tolerance Patient tolerated treatment well    Behavior During Therapy WFL for tasks assessed/performed                        Past Medical History:  Diagnosis Date   Anemia    Anxiety    Arthritis    Bipolar disorder (HCC)    Depression    GERD (gastroesophageal reflux disease)    Heart murmur    "related to VSD"   High cholesterol    History of blood transfusion    "related to OR" (08/19/2016)   History of hiatal hernia    Hypertension    Hyperthyroidism    Mild cognitive impairment 09/06/2018   Paroxysmal ventricular tachycardia (HCC)    Type II diabetes mellitus (HCC)    UTI (urinary tract infection)    being treated with Keflex   Ventricular septal defect    Ventriculitis of brain due to bacteria 11/02/2022   Vertebral fracture, osteoporotic (HCC) 01/06/2023   Past Surgical History:  Procedure Laterality Date   ABDOMINAL HYSTERECTOMY     BACK SURGERY     CARDIAC CATHETERIZATION N/A 06/23/2015   Procedure: Left Heart Cath and Coronary Angiography;  Surgeon: Laurey Morale, MD;  Location: [facility] INVASIVE CV LAB;  Service: Cardiovascular;  Laterality: N/A;   CARDIAC CATHETERIZATION  1960   "VSD was so small; didn't need repaired"   EXAM UNDER ANESTHESIA WITH MANIPULATION OF HIP Right 06/02/2014   dr Jerl Santos   FRACTURE SURGERY     HERNIA  REPAIR     HIP CLOSED REDUCTION Right 06/02/2014   Procedure: CLOSED MANIPULATION HIP;  Surgeon: Velna Ochs, MD;  Location: [facility] OR;  Service: Orthopedics;  Laterality: Right;   JOINT REPLACEMENT     JOINT REPLACEMENT     POSTERIOR LUMBAR FUSION 4 LEVEL N/A 06/26/2022   Procedure: Lumbar One To Lumbar Five Posterior Instrumented Fusion;  Surgeon: Jadene Pierini, MD;  Location: [facility] OR;  Service: Neurosurgery;  Laterality: N/A;   REFRACTIVE SURGERY Bilateral    SHOULDER ARTHROSCOPY Right    SHOULDER OPEN ROTATOR CUFF REPAIR Right    SPINAL FUSION  1996   "t10 down to my coccyx   SPINE HARDWARE REMOVAL     TOTAL ABDOMINAL HYSTERECTOMY     TOTAL HIP ARTHROPLASTY Right 05/10/2014   [email or web address]      by dr Cristal Deer olcott   TOTAL KNEE ARTHROPLASTY Left    TOTAL SHOULDER ARTHROPLASTY Left 08/19/2016   Procedure: TOTAL SHOULDER ARTHROPLASTY;  Surgeon: Jones Broom, MD;  Location: [facility] OR;  Service: Orthopedics;  Laterality: Left;  Left total shoulder replacement   Patient Active Problem List   Diagnosis Date Noted   Nonobstructive atherosclerosis of coronary  artery 02/10/2023   Vertebral fracture, osteoporotic (HCC) 01/06/2023   Ventriculitis of brain due to bacteria 11/02/2022   Anemia of chronic illness 10/12/2022   Bipolar disorder, in partial remission, most recent episode depressed (HCC) 10/07/2022   Adjustment disorder with mixed anxiety and depressed mood 09/30/2022   Chronic wound 09/25/2022   Hyperglycemia 09/25/2022   Chronic midline thoracic back pain 09/17/2022   Primary insomnia 09/17/2022   Chronic combined systolic and diastolic CHF (congestive heart failure) (HCC) 09/14/2022   Acute blood loss anemia 09/09/2022   Infection of lumbar spine (HCC) 09/03/2022   Malnutrition of moderate degree 08/20/2022   Hardware complicating wound infection (HCC) 08/20/2022   Infection of deep incisional surgical site after procedure 08/20/2022   Aspiration pneumonia of both  lungs (HCC) 08/19/2022   Acute respiratory failure with hypoxia (HCC) 08/19/2022   Sepsis (HCC) 08/19/2022   Seizure (HCC) 08/19/2022   Wound dehiscence 08/09/2022   Delayed surgical wound healing 07/08/2022   Fracture of lumbar spine without cord injury (HCC) 06/26/2022   Lumbar vertebral fracture (HCC) 06/25/2022   Mood disorder (HCC) 09/29/2021   Mild cognitive impairment 09/06/2018   Hyponatremia 02/28/2018   S/P shoulder replacement, left 08/19/2016   Anxiety 10/17/2014   Acid reflux 10/17/2014   BP (high blood pressure) 10/17/2014   Arthritis, degenerative 10/17/2014   Adult hypothyroidism 10/17/2014   UTI (urinary tract infection) 06/03/2014   Fracture of bone adjacent to prosthesis 06/03/2014   Diabetes (HCC) 06/02/2014   Peri-prosthetic fracture of femur following total hip arthroplasty 06/02/2014   Urinary retention 05/13/2014   Chronic pain 05/10/2014   History of hip surgery 05/10/2014   UNSPECIFIED HEART FAILURE 06/24/2010   UNSPECIFIED CONGENITAL DEFECT OF SEPTAL CLOSURE 06/24/2010   TOBACCO ABUSE 03/18/2010   Secondary cardiomyopathy (HCC) 03/18/2010   DM 06/13/2009   HYPERTENSION, UNSPECIFIED 06/13/2009   VENTRICULAR TACHYCARDIA 06/13/2009   VENTRICULAR SEPTAL DEFECT, CONGENITAL 06/13/2009   Diabetes mellitus, type 2 (HCC) 06/13/2009   Essential (primary) hypertension 06/13/2009    ONSET DATE: 01/30/2023 (MD referral)  REFERRING DIAG: M46.26 (ICD-10-CM) - Infection of lumbar spine (HCC)   THERAPY DIAG:  Muscle weakness (generalized)  Unsteadiness on feet  Other abnormalities of gait and mobility  Rationale for Evaluation and Treatment: Rehabilitation  SUBJECTIVE:                                                                                                                                                                                             SUBJECTIVE STATEMENT: Doing ok. No new issues  Pt accompanied by: self  PERTINENT HISTORY: Hx of  chronic wound (thoracic spine  area) having had a postoperative surgery complicated by wound infection with CSF leak and ventriculitis with concern for hardware potentially being involved; osteomyelitis; spinal compression fracture (T10)   67 y.o. female with hx of scoliosis surgery with extensive lower thoracic and lumbar fusion roughly 102yrs ago, she suffered ground level fall in late November, where she fractures through all three columns of L3, thus went to OR on 12/9 for stabilization, new  posterolateral instrumentation fusion on June 26, 2022 complicated by Wound dehiscence with exposed hardware and unstable lumbar spine fracture status post revision of lumbar wound with removal of L1-3 4 and 5 lumbar screws and rod and removal of right L1 screw and revision of L1-L5 posterior instrumented fusion on August 09, 2022 and now concern for postoperative wound infection and ventriculitis. She had complicated protracted course in the icu, but eventually was discharged to CIR on vancomycin and cefepime through 3/14 for ventriculitis and hw complicating wound infection.    Mri of brain on 09/29/22- resolution of ventriculitis.  PAIN:  Are you having pain? Yes: NPRS scale: 5/10 Pain location: lower R flank/low back around fusion site  Pain description: throbbing, achy Aggravating factors: longer car rides  Relieving factors: sitting  PRECAUTIONS: Back, Fall, and Other: hx of osteoporosis and fractures ; avoid twisting; avoid excess bending; is to log roll for bed mobility; (thoracic) wound being followed by wound care.  RED FLAGS: None   WEIGHT BEARING RESTRICTIONS: No  FALLS: Has patient fallen in last 6 months? Yes. Number of falls 1  LIVING ENVIRONMENT: Lives with: lives with their spouse; has caregiver at home; ramp to enter; one level home Lives in: House/apartment Stairs: Yes: Internal: 1 steps; none; family's homes have steps to get in Has following equipment at home: Dan Humphreys - 2  wheeled, Wheelchair (manual), Shower bench, and bed side commode *has used shoe with lift in the distant past for Leg length discrepancy PLOF: Independent using cane  PATIENT GOALS: Wants to work on balance, walking, stairs, to get in and out of a car better to be independent again (03/29/2023:  to walk with a cane)  OBJECTIVE:   TODAY'S TREATMENT: 04/21/23 Activity Comments  LAQ  1x10 5# 1x12 9# 1x14 9#  Hip add iso 3x12   Seated hamstring curls 3x12 Red+green  Seated clamshells 3x12 Red+green  Gait training -use of rollator and CGA 1x85 ft, 2x85 ft, 1x65 ft  Standing paloff press 2x10 Red resistance       TODAY'S TREATMENT: 04/19/23 Activity Comments  sitting paloff press green TB 10x Cueing for proper form and alignment ; pt reports fatigue   sitting shoulder extension red TB 2x10 Cueing to maintain elbows straight   standing ant/pos wt shift  At counter  standing lateral wt shift At counter  standing march 2x10 At counter; cueing for larger amplitude   gait training with SPC in R hand 25 ft,  CGA for safety and cues for proper sequencing. Pt requested not to continue further with session d/t fatigue d/t not being a "morning person"       Access Code: [email or web address] URL: [email or web address] Date: 03/07/2023 Prepared by: [facility] - Outpatient  Rehab - [facility]  Exercises - Seated Long Arc Quad with Ankle Weight  - 1 x daily - 7 x weekly - 3 sets - 10 reps - Seated Hamstring Curl with Anchored Resistance  - 1 x daily - 7 x weekly - 3 sets - 10 reps - Seated Hip Abduction with Resistance  -  1 x daily - 7 x weekly - 3 sets - 10 reps - Supine Quad Set  - 1 x daily - 7 x weekly - 3 sets - 10 reps    -------------------------------------------------------------------------------------------------- Objective measures below taken at initial evaluation:  DIAGNOSTIC FINDINGS: CT lumbar spine 12/24/22 IMPRESSION: 1. Pronounced chronic osteopenia. Prior  decompression and fusion throughout the lumbar spine.   2. L3 superior endplate/superior body non-acute fracture AND subtle associated horizontal fracture through the chronic posterior element bone mass there (series 9, image 53 today). But no displacement over this series of exams. 3. Difficult to exclude nondisplaced S3/S4 sacral fracture, with mild presacral stranding there.   4. But no other acute osseous abnormality identified. And elsewhere the chronic T12 through sacral ankylosis/arthrodesis appears intact.   5. T10 compression fracture is detailed separately today on CT Thoracic Spine.  COGNITION: Overall cognitive status: Within functional limits for tasks assessed   SENSATION: Light touch: WFL Reports numbness bottom of L foot, sometimes into lateral calf    POSTURE: rounded shoulders, weight shift right, and lateral lean towards R, L shoulder lower than R  LOWER EXTREMITY ROM:   WFL except decreased full Active R hip flexion  LOWER EXTREMITY MMT:    MMT Right Eval Left Eval  Hip flexion 3- 3  Hip extension    Hip abduction    Hip adduction    Hip internal rotation    Hip external rotation    Knee flexion 3+ 3+  Knee extension 3+ 4  Ankle dorsiflexion 3+ 3+  Ankle plantarflexion    Ankle inversion    Ankle eversion    (Blank rows = not tested)   TRANSFERS: Assistive device utilized: Environmental consultant - 2 wheeled  Sit to stand: CGA Stand to sit: CGA  GAIT: Gait pattern: step to pattern, step through pattern, decreased step length- Right, decreased step length- Left, Left hip hike, and knee flexed in stance- Left Distance walked: 35 ft Assistive device utilized: Walker - 2 wheeled Level of assistance: CGA Comments: stops x 1 due to fatigue  FUNCTIONAL TESTS:  10 meter walk test: attempted; unable to complete:  she ambulates 30 ft in 93.6 sec (0.32 ft/sec) Pt stands near walker, no UE support with supervision x 20-30 seconds     PATIENT  EDUCATION: Education details: Eval results, POC Person educated: Patient and Child(ren) Education method: Explanation Education comprehension: verbalized understanding  HOME EXERCISE PROGRAM: Access Code: [email or web address] URL: [email or web address] Date: 02/16/2023 Prepared by: Shary Decamp  Exercises - Seated Long Arc Quad with Ankle Weight  - 1 x daily - 7 x weekly - 3 sets - 10 reps - Seated Hamstring Curl with Anchored Resistance  - 1 x daily - 7 x weekly - 3 sets - 10 reps - Seated Hip Abduction with Resistance  - 1 x daily - 7 x weekly - 3 sets - 10 reps  GOALS: Goals reviewed with patient? Yes   SHORT TERM GOALS: (UPDATED) Target date: 05/06/2023  Pt will be independent with progression of HEP for improved strength, balance, gait. Baseline: Goal status: IN PROGRESS  2.  Pt will improve 5x sit<>stand to less than or equal to 15 sec to demonstrate improved functional strength and transfer efficiency. Baseline: 17.22 sec with UE support Goal status: IN PROGRESS  3.  Pt will improve TUG score to less than or equal to 17 sec for decreased fall risk. Baseline: 20.84 sec Goal status: IN PROGRESS   LONG TERM GOALS: UPDATED Target date: 06/03/2023  Pt will verbalize plans for continued community fitness upon d/c from PT to maximize gains made in rehab. Baseline:  Goal status: IN PROGRESS  2.  Pt will improve to 180 ft to demonstrate improved functional strength and transfer efficiency. Baseline: 138 ft  Goal status: IN PROGRESS  3.  Pt will improve TUG score to less than or equal to 14 sec for decreased fall risk. Baseline: 20.84 sec Goal status: IN PROGRESS  4.  Pt will improve gait velocity to at least 2 ft/sec (with cane versus RW) for improved gait efficiency and safety. Baseline: 1.84 ft/sec with RW Goal status: IN PROGRESS  5.  Pt will ambulate at least 200 ft using SPC, mod I for improved household gait. Baseline: TUG with cane 20.84 sec Goal  status: IN PROGRESS  6.  Pt will ambulate at least 500 ft with RW for improved community gait. Baseline:  Goal status: IN PROGRESS  ASSESSMENT:  CLINICAL IMPRESSION: Progressed open chain strength training to increased resistance to 9# and double band resistance to improve muscular endurance and activity tolerance.  Pt requests trial with rollator to improve ability to walk and carry items.  Demo good stability with device but notes increased UE fatigue with device vs RW.  Demo tray devices to add to RW for benefit of carrying.  Continued sessions to progress mobility and activity tolerance to improve functional ambulation  OBJECTIVE IMPAIRMENTS: Abnormal gait, decreased balance, decreased mobility, difficulty walking, decreased strength, impaired flexibility, postural dysfunction, and pain.   ACTIVITY LIMITATIONS: bending, standing, stairs, transfers, bathing, toileting, dressing, hygiene/grooming, and locomotion level  PARTICIPATION LIMITATIONS: driving, shopping, and community activity  PERSONAL FACTORS: 3+ comorbidities: See PMH  are also affecting patient's functional outcome.   REHAB POTENTIAL: Good  CLINICAL DECISION MAKING: Evolving/moderate complexity  EVALUATION COMPLEXITY: Moderate  PLAN:  PT FREQUENCY: 2x/week  PT DURATION: 8 weeks plus eval week  PLANNED INTERVENTIONS: Therapeutic exercises, Therapeutic activity, Neuromuscular re-education, Balance training, Gait training, Patient/Family education, Self Care, Stair training, Orthotic/Fit training, DME instructions, and Moist heat  PLAN FOR NEXT SESSION: Continue standing balance-EO/EC and varied foot positions.  Continue work on core stability (seated) and strengthening.  Continue to progress HEP for lower extremity strengthening. Continue standing weightshifting activities to increase LLE weightshift in standing.  Work on Journalist, newspaper outdoor gait.  (Maybe consider standing exercises at some point for  HEP)   12:48 PM, 04/21/23 M. Shary Decamp, PT, DPT Physical Therapist- [place] Office Number: [phone]

## 2023-04-26 ENCOUNTER — Ambulatory Visit: Payer: Medicare Other | Admitting: Occupational Therapy

## 2023-04-26 ENCOUNTER — Ambulatory Visit: Payer: Medicare Other

## 2023-04-27 ENCOUNTER — Ambulatory Visit: Payer: Medicare Other

## 2023-04-28 ENCOUNTER — Ambulatory Visit: Payer: Medicare Other | Admitting: Physical Therapy

## 2023-04-28 ENCOUNTER — Ambulatory Visit: Payer: Medicare Other | Admitting: Occupational Therapy

## 2023-04-28 ENCOUNTER — Other Ambulatory Visit: Payer: Medicare Other

## 2023-05-10 ENCOUNTER — Ambulatory Visit
Admission: RE | Admit: 2023-05-10 | Discharge: 2023-05-10 | Disposition: A | Discharge status: Home / Self Care | Payer: Medicare Other | Source: Ambulatory Visit | Attending: Family Medicine | Admitting: Family Medicine

## 2023-05-10 DIAGNOSIS — Z122 Encounter for screening for malignant neoplasm of respiratory organs: Secondary | ICD-10-CM

## 2023-05-12 ENCOUNTER — Telehealth: Payer: Self-pay | Admitting: Physical Therapy

## 2023-05-12 ENCOUNTER — Ambulatory Visit: Payer: Medicare Other | Admitting: Physical Therapy

## 2023-05-12 NOTE — Telephone Encounter (Addendum)
Called and spoke to patient regarding missed PT appointment today.  She has been sick with Covid, still doesn't feel well.  She will call back when she knows she is cleared from Covid to come back to therapy.  Lonia Blood, PT 05/12/23 10:41 AM Phone: [phone] Fax: [phone]

## 2023-05-16 ENCOUNTER — Ambulatory Visit: Payer: Medicare Other | Admitting: Cardiology

## 2023-05-17 ENCOUNTER — Ambulatory Visit: Payer: Medicare Other | Admitting: Student

## 2023-05-24 ENCOUNTER — Ambulatory Visit
Admission: RE | Admit: 2023-05-24 | Discharge: 2023-05-24 | Disposition: A | Discharge status: Home / Self Care | Payer: Medicare Other | Source: Ambulatory Visit | Attending: Family Medicine | Admitting: Family Medicine

## 2023-05-24 ENCOUNTER — Encounter: Payer: Self-pay | Admitting: Physical Therapy

## 2023-05-24 ENCOUNTER — Ambulatory Visit: Payer: Medicare Other | Admitting: Physical Therapy

## 2023-05-24 DIAGNOSIS — M6281 Muscle weakness (generalized): Secondary | ICD-10-CM

## 2023-05-24 DIAGNOSIS — R2689 Other abnormalities of gait and mobility: Secondary | ICD-10-CM

## 2023-05-24 DIAGNOSIS — R2681 Unsteadiness on feet: Secondary | ICD-10-CM

## 2023-05-24 DIAGNOSIS — Z0000 Encounter for general adult medical examination without abnormal findings: Secondary | ICD-10-CM

## 2023-05-24 NOTE — Therapy (Signed)
[place] [facility] [address], [address] [place], [place], [address] Phone: [phone]   Fax:  [phone]  Patient Details  Name: [name] MRN: [id number] Date of Birth: June 02, 1956 Referring Provider:  Aliene Beams, MD  Encounter Date: 05/24/2023  Pt arrives today after being out of therapy since 04/21/23 due to Covid.  She reports she has been walking a lot with walker.  She reports having several family events where she was standing and walking last week.  Her back has been hurting badly where the fusion was, since late last week.  She reports l"ying down is okay; standing up just takes my breath away."  I have called Dr. Marcia Brash to follow up. (Dr. Ludger Nutting office calls while patient is at PT; she has appt for 9/15 on 05/26/23).  Advised to hold on PT until she sees Dr. Marcia Brash, due to intense, consistent pain that is not lessening, given pt's extensive history with her back surgeries.  She is in agreement.  No charge for today.   Malaia Buchta W., PT 05/24/2023, 3:07 PM  [place] [facility] [address] [place], [place], [address] Phone: [phone]   Fax:  [phone]

## 2023-05-26 ENCOUNTER — Other Ambulatory Visit: Payer: Self-pay | Admitting: Neurological Surgery

## 2023-05-26 ENCOUNTER — Ambulatory Visit: Payer: Medicare Other | Admitting: Physical Therapy

## 2023-05-26 DIAGNOSIS — S32001D Stable burst fracture of unspecified lumbar vertebra, subsequent encounter for fracture with routine healing: Secondary | ICD-10-CM

## 2023-05-27 ENCOUNTER — Encounter: Payer: Self-pay | Admitting: Neurological Surgery

## 2023-05-31 ENCOUNTER — Inpatient Hospital Stay
Admission: RE | Admit: 2023-05-31 | Discharge: 2023-05-31 | Discharge status: Home / Self Care | Payer: Medicare Other | Source: Ambulatory Visit | Attending: Neurological Surgery

## 2023-05-31 ENCOUNTER — Ambulatory Visit
Admission: RE | Admit: 2023-05-31 | Discharge: 2023-05-31 | Disposition: A | Discharge status: Home / Self Care | Payer: Medicare Other | Source: Ambulatory Visit | Attending: Neurological Surgery | Admitting: Neurological Surgery

## 2023-05-31 ENCOUNTER — Ambulatory Visit: Payer: Medicare Other | Admitting: Physical Therapy

## 2023-05-31 DIAGNOSIS — S32001D Stable burst fracture of unspecified lumbar vertebra, subsequent encounter for fracture with routine healing: Secondary | ICD-10-CM

## 2023-06-02 ENCOUNTER — Ambulatory Visit: Payer: Medicare Other | Admitting: Physical Therapy

## 2023-06-03 ENCOUNTER — Other Ambulatory Visit: Payer: Self-pay

## 2023-06-03 DIAGNOSIS — M810 Age-related osteoporosis without current pathological fracture: Secondary | ICD-10-CM

## 2023-06-08 ENCOUNTER — Other Ambulatory Visit: Payer: Medicare Other

## 2023-06-08 ENCOUNTER — Ambulatory Visit: Payer: Medicare Other | Admitting: Infectious Disease

## 2023-06-08 NOTE — Progress Notes (Deleted)
Subjective:  Chief complaint: Follow-up for chronic wound, with postoperative surgery complicated by CSF leak and ventriculitis and concern for hardware having been potentially seeded    Patient ID: [name], female    DOB: 24-Jun-1956, 67 y.o.   MRN: [id number]  HPI   67 y.o. female with hx of scoliosis surgery with extensive lower thoracic and lumbar fusion roughly 58yrs ago, she suffered ground level fall in late November, where she fractures through all three columns of L3, thus went to OR on 12/9 for stabilization, new  posterolateral instrumentation fusion on June 26, 2022 complicated by Wound dehiscence with exposed hardware and unstable lumbar spine fracture status post revision of lumbar wound with removal of L1-3 4 and 5 lumbar screws and rod and removal of right L1 screw and revision of L1-L5 posterior instrumented fusion on August 09, 2022 and now concern for postoperative wound infection and ventriculitis. She had complicated protracted course in the icu, but eventually was discharged to CIR on vancomycin and cefepime through 3/14 for ventriculitis and hw complicating wound infection. Now changed to doxcycyline plus levofloxacin as of 3/15 while she continued to need medihoney with moistened quaze with sliver hydrofiber to the caudal portion of wound.   Mri of brain on 09/29/22- resolution of ventriculitis.   She was seen by my partner Dr. Drue Second while she was in the inpatient rehab unit.  Follow-up was rescheduled for May   Back pain did not appear to be worse but wound did persist and apparently tunneled cranially.  Her white blood cell count with [facility] was in the 16,000 range an abrupt change from her last labs as an inpatient when it was in the 7000 range.  He did have a mechanical fall and fell on her left knee where she has a prosthetic joint.  When I last saw her we rechecked her inflammatory markers and sed rate had titrated down though CRP was  going up to 15.6  WBC recheck with our lab was 12,000.  In the interim she has been seen by Dr. Johnsie Cancel with neurosurgery and CT of the thoracic and lumbar spine were performed.  These have shown: A new T10 compression fracture with 12% loss of height with unchanged change adjacent chronic T10-T11 T12-L1 posterior element ankylosis with chronic nonfusion at T11-T12, L3 superior endplate nonacute fracture and subtle horizontal fracture of the chronic posterior element of the bone, difficult exclude nondisplaced S3-4 sacral fracture with mild presacral stranding.   Checked labs last time we saw her white blood cell count remained elevated 12,000.  Sed rate and CRP Satteson sed rate actually normalized and CRP had gone up.  Changed her evofloxacin and doxycycline to cefdinir and doxycycline.  She is continuing to take these.  She did develop a painful erythematous rash on her hands due to doxycycline use in the context of sun exposure at the beach.  This is subsequently resolved she has a new somewhat purpuric rash on her right arm that came up in the last few days which she is curious about.  She returns to clinic today for follow-up     Past Medical History:  Diagnosis Date   Anemia    Anxiety    Arthritis    Bipolar disorder (HCC)    Depression    GERD (gastroesophageal reflux disease)    Heart murmur    "related to VSD"   High cholesterol    History of blood transfusion    "related to OR" (  08/19/2016)   History of hiatal hernia    Hypertension    Hyperthyroidism    Mild cognitive impairment 09/06/2018   Paroxysmal ventricular tachycardia (HCC)    Type II diabetes mellitus (HCC)    UTI (urinary tract infection)    being treated with Keflex   Ventricular septal defect    Ventriculitis of brain due to bacteria 11/02/2022   Vertebral fracture, osteoporotic (HCC) 01/06/2023    Past Surgical History:  Procedure Laterality Date   ABDOMINAL HYSTERECTOMY     BACK SURGERY      CARDIAC CATHETERIZATION N/A 06/23/2015   Procedure: Left Heart Cath and Coronary Angiography;  Surgeon: Laurey Morale, MD;  Location: [facility] INVASIVE CV LAB;  Service: Cardiovascular;  Laterality: N/A;   CARDIAC CATHETERIZATION  1960   "VSD was so small; didn't need repaired"   EXAM UNDER ANESTHESIA WITH MANIPULATION OF HIP Right 06/02/2014   dr Jerl Santos   FRACTURE SURGERY     HERNIA REPAIR     HIP CLOSED REDUCTION Right 06/02/2014   Procedure: CLOSED MANIPULATION HIP;  Surgeon: Velna Ochs, MD;  Location: [facility] OR;  Service: Orthopedics;  Laterality: Right;   JOINT REPLACEMENT     JOINT REPLACEMENT     POSTERIOR LUMBAR FUSION 4 LEVEL N/A 06/26/2022   Procedure: Lumbar One To Lumbar Five Posterior Instrumented Fusion;  Surgeon: Jadene Pierini, MD;  Location: [facility] OR;  Service: Neurosurgery;  Laterality: N/A;   REFRACTIVE SURGERY Bilateral    SHOULDER ARTHROSCOPY Right    SHOULDER OPEN ROTATOR CUFF REPAIR Right    SPINAL FUSION  1996   "t10 down to my coccyx   SPINE HARDWARE REMOVAL     TOTAL ABDOMINAL HYSTERECTOMY     TOTAL HIP ARTHROPLASTY Right 05/10/2014   [facility]      by dr Cristal Deer olcott   TOTAL KNEE ARTHROPLASTY Left    TOTAL SHOULDER ARTHROPLASTY Left 08/19/2016   Procedure: TOTAL SHOULDER ARTHROPLASTY;  Surgeon: Jones Broom, MD;  Location: [facility] OR;  Service: Orthopedics;  Laterality: Left;  Left total shoulder replacement    Family History  Problem Relation Age of Onset   Other Mother        alive   Stroke Father 62       deceased   Dementia Father    Chorea Maternal Grandfather    Dementia Maternal Aunt    Dementia Maternal Aunt    Heart attack Other        multiple uncles have died with myocardial infarction      Social History   Socioeconomic History   Marital status: Married    Spouse name: Not on file   Number of children: Not on file   Years of education: Not on file   Highest education level: Master's degree (e.g., MA, MS, MEng, MEd,  MSW, MBA)  Occupational History   Occupation: Magazine features editor: SELF-EMPLOYED    Comment: former  Tobacco Use   Smoking status: Former    Types: Cigarettes    Passive exposure: Never   Smokeless tobacco: Never   Tobacco comments:    03/30/21 smokes 2 cigs daily.      08/06/22 Shanda Bumps stated that the patient is not smoking while at [facility] for rehab. [facility]  Vaping Use   Vaping status: Never Used  Substance and Sexual Activity   Alcohol use: No   Drug use: No   Sexual activity: Not Currently    Birth control/protection: Surgical  Comment: Hysterectomy  Other Topics Concern   Not on file  Social History Narrative   Right handed   Caffeine 2-3 cups daily    Lives at home with husband    Social Determinants of Health   Financial Resource Strain: Not on file  Food Insecurity: No Food Insecurity (06/27/2022)   Hunger Vital Sign    Worried About Running Out of Food in the Last Year: Never true    Ran Out of Food in the Last Year: Never true  Transportation Needs: No Transportation Needs (06/27/2022)   PRAPARE - Administrator, Civil Service (Medical): No    Lack of Transportation (Non-Medical): No  Physical Activity: Not on file  Stress: Not on file  Social Connections: Not on file    No Known Allergies   Current Outpatient Medications:    acetaminophen (TYLENOL) 325 MG tablet, Take 1-2 tablets (325-650 mg total) by mouth every 6 (six) hours as needed for mild pain or headache., Disp: , Rfl:    buprenorphine (BUTRANS) 5 MCG/HR PTWK, Place 1 patch onto the skin once a week., Disp: 4 patch, Rfl: 0   buPROPion (WELLBUTRIN XL) 150 MG 24 hr tablet, Take 1 tablet (150 mg total) by mouth daily., Disp: 30 tablet, Rfl: 0   carvedilol (COREG) 25 MG tablet, Take 1 tablet (25 mg total) by mouth 2 (two) times daily with a meal., Disp: 60 tablet, Rfl: 0   cefdinir (OMNICEF) 300 MG capsule, Take 1 capsule (300 mg total) by mouth 2 (two) times daily., Disp: 60 capsule,  Rfl: 5   collagenase (SANTYL) 250 UNIT/GM ointment, Apply 1 Application topically daily. Apply 2-3 grams to affected area daily., Disp: 30 g, Rfl: 0   diclofenac Sodium (VOLTAREN) 1 % GEL, Apply 2 g topically 4 (four) times daily., Disp: 350 g, Rfl: 0   diphenoxylate-atropine (LOMOTIL) 2.5-0.025 MG tablet, Take 1 tablet by mouth 2 (two) times daily as needed for diarrhea or loose stools., Disp: 30 tablet, Rfl: 0   doxycycline (VIBRA-TABS) 100 MG tablet, Take 1 tablet (100 mg total) by mouth every 12 (twelve) hours., Disp: 60 tablet, Rfl: 4   empagliflozin (JARDIANCE) 10 MG TABS tablet, Take 1 tablet (10 mg total) by mouth daily before breakfast., Disp: 90 tablet, Rfl: 3   famotidine (PEPCID) 20 MG tablet, Take 1 tablet (20 mg total) by mouth 2 (two) times daily., Disp: 60 tablet, Rfl: 0   fluvoxaMINE (LUVOX) 100 MG tablet, Take 1 tablet (100 mg total) by mouth at bedtime., Disp: 30 tablet, Rfl: 0   gabapentin (NEURONTIN) 100 MG capsule, Take 1 capsule (100 mg total) by mouth 3 (three) times daily., Disp: 90 capsule, Rfl: 0   Lactobacillus (ACIDOPHILUS) 100 MG CAPS, Take 1 capsule (100 mg total) by mouth 2 (two) times daily., Disp: 60 capsule, Rfl: 0   lamoTRIgine (LAMICTAL) 200 MG tablet, Take 1 tablet (200 mg total) by mouth daily., Disp: 30 tablet, Rfl: 0   levothyroxine (SYNTHROID) 88 MCG tablet, Take 1 tablet (88 mcg total) by mouth daily before breakfast., Disp: 30 tablet, Rfl: 0   losartan (COZAAR) 25 MG tablet, Take 1 tablet (25 mg total) by mouth at bedtime., Disp: 30 tablet, Rfl: 0   melatonin 5 MG TABS, Take 1 tablet (5 mg total) by mouth at bedtime as needed., Disp: 30 tablet, Rfl: 0   metFORMIN (GLUCOPHAGE) 500 MG tablet, Take 1 tablet (500 mg total) by mouth 2 (two) times daily with a meal., Disp: 60  tablet, Rfl: 0   methocarbamol (ROBAXIN) 750 MG tablet, Take 1 tablet (750 mg total) by mouth every 8 (eight) hours as needed for muscle spasms., Disp: 90 tablet, Rfl: 1   Multiple Vitamin  (MULTIVITAMIN) tablet, Take 1 tablet by mouth daily.  , Disp: , Rfl:    ondansetron (ZOFRAN) 4 MG tablet, Take 1 tablet (4 mg total) by mouth every 8 (eight) hours as needed for refractory nausea / vomiting., Disp: 20 tablet, Rfl: 0   potassium chloride (KLOR-CON M) 10 MEQ tablet, Take 3 tablets (30 mEq total) by mouth 2 (two) times daily., Disp: 120 tablet, Rfl: 0   rosuvastatin (CRESTOR) 20 MG tablet, Take 20 mg by mouth daily., Disp: , Rfl:    sodium chloride 1 g tablet, Take 1 tablet (1 g total) by mouth 2 (two) times daily with a meal., Disp: 60 tablet, Rfl: 0   traZODone (DESYREL) 100 MG tablet, Take 1 tablet (100 mg total) by mouth at bedtime., Disp: 30 tablet, Rfl: 0   Review of Systems     Objective:   Physical Exam Wound 11/02/2022:     Wound 12/09/2022:    Wound 01/06/2023:       Picture today and was nearly completely closed up  Rash 03/09/2023:    Assessment & Plan:    67 y.o. female with recent vertebral fracture status post hardware placement which was then exposed removed with revision complicated by CSF leak then admitted with fevers found to have postoperative fluid collection multifocal pneumonia thought to be due to aspiration and possible meningitis and ventricultis  The presence of hardware again we would like her to be on protracted antibiotics and we will try to push into February with the doxycycline and the cefdinir we will check a sed rate CRP CBC with differential and a CMP today.  Rash on arm  Not clear what this is.  See cycle can certainly cause the sun exposure rash that she previously had and can cause a hyperpigmented dictated rash but this does not seem particularly consistent with either of those.  I will check a CBC as I mentioned CMP and check coagulation factors as well.  Doxycycline is rash: Resolved.  Osteoporosis referral made to endocrinology

## 2023-06-09 ENCOUNTER — Other Ambulatory Visit: Payer: Medicare Other

## 2023-06-09 ENCOUNTER — Encounter: Payer: Medicare Other | Admitting: Physical Medicine & Rehabilitation

## 2023-06-13 ENCOUNTER — Ambulatory Visit: Payer: Medicare Other | Admitting: Physical Medicine & Rehabilitation

## 2023-06-21 ENCOUNTER — Ambulatory Visit (INDEPENDENT_AMBULATORY_CARE_PROVIDER_SITE_OTHER): Payer: Medicare Other | Admitting: "Endocrinology

## 2023-06-21 ENCOUNTER — Encounter: Payer: Self-pay | Admitting: "Endocrinology

## 2023-06-21 ENCOUNTER — Other Ambulatory Visit: Payer: Self-pay

## 2023-06-21 VITALS — BP 130/84 | HR 78 | Ht 61.0 in | Wt 137.0 lb

## 2023-06-21 DIAGNOSIS — E559 Vitamin D deficiency, unspecified: Secondary | ICD-10-CM

## 2023-06-21 DIAGNOSIS — M810 Age-related osteoporosis without current pathological fracture: Secondary | ICD-10-CM

## 2023-06-21 DIAGNOSIS — M8468XD Pathological fracture in other disease, other site, subsequent encounter for fracture with routine healing: Secondary | ICD-10-CM | POA: Diagnosis not present

## 2023-06-21 NOTE — Progress Notes (Signed)
[facility] Note Marissa Bath, MD    Referring Provider: Daiva Eves, Lisette Grinder, MD Primary Care Provider: Aliene Beams, MD No chief complaint on file.    Assessment & Plan  Diagnoses and all orders for this visit:  Pathological fracture of vertebra due to other disease with routine healing, subsequent encounter    Spontaneous fracture Unclear etiology and origin. History of scoliosis and early menopause. No BMD available but pt reports her wrist BMD was WNL done in Sep-Oct 2024 DXA, chasing report. Cannot do spine/any hip BMD due to fusion/metal replacement. Recommend to use calcium 600 mg twice daily and vitamin D 2000 units OTC supplements.    Educated on risks and side effects of reclast including but not limited to esophagitis, worsening GERD, atypical femoral fractures and osteonecrosis of the jaw. Advised to take medication first thing in the morning with plenty of water and stay upright for 30 minutes after taking the medication.  Cannot do fosamax due to poor GERD. Will order reclast (Cr/GFR WNL per 02/2023 labs)  Follow fall precautions, adequate dairy in diet and exercises (aerobic, balancing and weight bearing) as tolerated.   Return in about 3 months (around 09/19/2023) for visit, labs today.  I have reviewed current medications, nurse's notes, allergies, vital signs, past medical and surgical history, family medical history, and social history for this encounter. Counseled patient on symptoms, examination findings, lab findings, imaging results, treatment decisions and monitoring and prognosis. The patient understood the recommendations and agrees with the treatment plan. All questions regarding treatment plan were fully answered.   Marissa Hays, MD   06/21/23    History of Present Illness [name] is a 67 y.o. year old female who presents to our clinic with spontaneous fracture. Not on any calcium or vit D but takes a MVI.  Patient  reports history of scoliosis, having has both hips replaced by metal and complete spinal fusion at age 70 so cannot diagnose osteoporosis. Patient reports spontaneous T10 fracture found in 2024 without any trauma. Larey Seat and broke R femur around 2019. Patient is wheelchair bound.   Thinks she might have taken fosamax years ago, got prolia one shot in 2024.   Cannot  do fosamax due to bad GERD.  05/2023 CT lumbar reported chronic minimal superior endplate compression fracture at L3, unchanged. Previously seen fracture through the fused posterior elements at L2-L3 has healed.   Risk Factors screening:  History of low trauma fractures: Yes Family history of osteoporosis: Yes Hip fracture in first-degree relatives: No Smoking history: Yes Excessive alcohol intake >2 drinks/day: No Excessive caffeine intake >2 drinks/day: No Glucocorticoid use >5mg  prednisone/day for >3 months: No Rheumatoid arthritis history: No Premature/Surgical Menopause: Yes, has menopause at 60 like her mom, got hormone for <5 yrs   Physical Exam  BP 130/84   Pulse 78   Ht 5\' 1"  (1.549 m)   Wt 137 lb (62.1 kg)   SpO2 98%   BMI 25.89 kg/m  Constitutional: well developed, well nourished Head: normocephalic, atraumatic Eyes: sclera anicteric, no redness Neck: supple Lungs: normal respiratory effort Neurology: alert and oriented Skin: dry, no appreciable rashes Musculoskeletal: no appreciable defects Psychiatric: normal mood and affect  Allergies No Known Allergies  Current Medications Patient's Medications  New Prescriptions   No medications on file  Previous Medications   ACETAMINOPHEN (TYLENOL) 325 MG TABLET    Take 1-2 tablets (325-650 mg total) by mouth every 6 (six) hours as needed for mild pain  or headache.   BUPRENORPHINE (BUTRANS) 5 MCG/HR PTWK    Place 1 patch onto the skin once a week.   BUPROPION (WELLBUTRIN XL) 150 MG 24 HR TABLET    Take 1 tablet (150 mg total) by mouth daily.   CARVEDILOL  (COREG) 25 MG TABLET    Take 1 tablet (25 mg total) by mouth 2 (two) times daily with a meal.   CEFDINIR (OMNICEF) 300 MG CAPSULE    Take 1 capsule (300 mg total) by mouth 2 (two) times daily.   COLLAGENASE (SANTYL) 250 UNIT/GM OINTMENT    Apply 1 Application topically daily. Apply 2-3 grams to affected area daily.   DICLOFENAC SODIUM (VOLTAREN) 1 % GEL    Apply 2 g topically 4 (four) times daily.   DIPHENOXYLATE-ATROPINE (LOMOTIL) 2.5-0.025 MG TABLET    Take 1 tablet by mouth 2 (two) times daily as needed for diarrhea or loose stools.   DOXYCYCLINE (VIBRA-TABS) 100 MG TABLET    Take 1 tablet (100 mg total) by mouth every 12 (twelve) hours.   EMPAGLIFLOZIN (JARDIANCE) 10 MG TABS TABLET    Take 1 tablet (10 mg total) by mouth daily before breakfast.   FAMOTIDINE (PEPCID) 20 MG TABLET    Take 1 tablet (20 mg total) by mouth 2 (two) times daily.   FLUVOXAMINE (LUVOX) 100 MG TABLET    Take 1 tablet (100 mg total) by mouth at bedtime.   GABAPENTIN (NEURONTIN) 100 MG CAPSULE    Take 1 capsule (100 mg total) by mouth 3 (three) times daily.   LACTOBACILLUS (ACIDOPHILUS) 100 MG CAPS    Take 1 capsule (100 mg total) by mouth 2 (two) times daily.   LAMOTRIGINE (LAMICTAL) 200 MG TABLET    Take 1 tablet (200 mg total) by mouth daily.   LEVOTHYROXINE (SYNTHROID) 88 MCG TABLET    Take 1 tablet (88 mcg total) by mouth daily before breakfast.   LOSARTAN (COZAAR) 25 MG TABLET    Take 1 tablet (25 mg total) by mouth at bedtime.   MELATONIN 5 MG TABS    Take 1 tablet (5 mg total) by mouth at bedtime as needed.   METFORMIN (GLUCOPHAGE) 500 MG TABLET    Take 1 tablet (500 mg total) by mouth 2 (two) times daily with a meal.   METHOCARBAMOL (ROBAXIN) 750 MG TABLET    Take 1 tablet (750 mg total) by mouth every 8 (eight) hours as needed for muscle spasms.   MULTIPLE VITAMIN (MULTIVITAMIN) TABLET    Take 1 tablet by mouth daily.     ONDANSETRON (ZOFRAN) 4 MG TABLET    Take 1 tablet (4 mg total) by mouth every 8 (eight)  hours as needed for refractory nausea / vomiting.   POTASSIUM CHLORIDE (KLOR-CON M) 10 MEQ TABLET    Take 3 tablets (30 mEq total) by mouth 2 (two) times daily.   ROSUVASTATIN (CRESTOR) 20 MG TABLET    Take 20 mg by mouth daily.   SODIUM CHLORIDE 1 G TABLET    Take 1 tablet (1 g total) by mouth 2 (two) times daily with a meal.   TRAZODONE (DESYREL) 100 MG TABLET    Take 1 tablet (100 mg total) by mouth at bedtime.  Modified Medications   No medications on file  Discontinued Medications   No medications on file     Past Medical History Past Medical History:  Diagnosis Date   Anemia    Anxiety    Arthritis    Bipolar disorder (  HCC)    Depression    GERD (gastroesophageal reflux disease)    Heart murmur    "related to VSD"   High cholesterol    History of blood transfusion    "related to OR" (08/19/2016)   History of hiatal hernia    Hypertension    Hyperthyroidism    Mild cognitive impairment 09/06/2018   Paroxysmal ventricular tachycardia (HCC)    Type II diabetes mellitus (HCC)    UTI (urinary tract infection)    being treated with Keflex   Ventricular septal defect    Ventriculitis of brain due to bacteria 11/02/2022   Vertebral fracture, osteoporotic (HCC) 01/06/2023    Past Surgical History Past Surgical History:  Procedure Laterality Date   ABDOMINAL HYSTERECTOMY     BACK SURGERY     CARDIAC CATHETERIZATION N/A 06/23/2015   Procedure: Left Heart Cath and Coronary Angiography;  Surgeon: Laurey Morale, MD;  Location: [facility] INVASIVE CV LAB;  Service: Cardiovascular;  Laterality: N/A;   CARDIAC CATHETERIZATION  1960   "VSD was so small; didn't need repaired"   EXAM UNDER ANESTHESIA WITH MANIPULATION OF HIP Right 06/02/2014   dr Jerl Santos   FRACTURE SURGERY     HERNIA REPAIR     HIP CLOSED REDUCTION Right 06/02/2014   Procedure: CLOSED MANIPULATION HIP;  Surgeon: Velna Ochs, MD;  Location: [facility] OR;  Service: Orthopedics;  Laterality: Right;   JOINT REPLACEMENT      JOINT REPLACEMENT     POSTERIOR LUMBAR FUSION 4 LEVEL N/A 06/26/2022   Procedure: Lumbar One To Lumbar Five Posterior Instrumented Fusion;  Surgeon: Jadene Pierini, MD;  Location: [facility] OR;  Service: Neurosurgery;  Laterality: N/A;   REFRACTIVE SURGERY Bilateral    SHOULDER ARTHROSCOPY Right    SHOULDER OPEN ROTATOR CUFF REPAIR Right    SPINAL FUSION  1996   "t10 down to my coccyx   SPINE HARDWARE REMOVAL     TOTAL ABDOMINAL HYSTERECTOMY     TOTAL HIP ARTHROPLASTY Right 05/10/2014   [facility]      by dr Cristal Deer olcott   TOTAL KNEE ARTHROPLASTY Left    TOTAL SHOULDER ARTHROPLASTY Left 08/19/2016   Procedure: TOTAL SHOULDER ARTHROPLASTY;  Surgeon: Jones Broom, MD;  Location: [facility] OR;  Service: Orthopedics;  Laterality: Left;  Left total shoulder replacement    Family History family history includes Chorea in her maternal grandfather; Dementia in her father, maternal aunt, and maternal aunt; Heart attack in an other family member; Other in her mother; Stroke (age of onset: 21) in her father.  Social History Social History   Socioeconomic History   Marital status: Married    Spouse name: Not on file   Number of children: Not on file   Years of education: Not on file   Highest education level: Master's degree (e.g., MA, MS, MEng, MEd, MSW, MBA)  Occupational History   Occupation: Magazine features editor: SELF-EMPLOYED    Comment: former  Tobacco Use   Smoking status: Former    Types: Cigarettes    Passive exposure: Never   Smokeless tobacco: Never   Tobacco comments:    03/30/21 smokes 2 cigs daily.      08/06/22 Shanda Bumps stated that the patient is not smoking while at [facility] for rehab. [facility]  Vaping Use   Vaping status: Never Used  Substance and Sexual Activity   Alcohol use: No   Drug use: No   Sexual activity: Not Currently    Birth control/protection:  Surgical    Comment: Hysterectomy  Other Topics Concern   Not on file  Social History Narrative   Right  handed   Caffeine 2-3 cups daily    Lives at home with husband    Social Determinants of Health   Financial Resource Strain: Not on file  Food Insecurity: No Food Insecurity (06/27/2022)   Hunger Vital Sign    Worried About Running Out of Food in the Last Year: Never true    Ran Out of Food in the Last Year: Never true  Transportation Needs: No Transportation Needs (06/27/2022)   PRAPARE - Administrator, Civil Service (Medical): No    Lack of Transportation (Non-Medical): No  Physical Activity: Not on file  Stress: Not on file  Social Connections: Not on file  Intimate Partner Violence: Not At Risk (06/27/2022)   Humiliation, Afraid, Rape, and Kick questionnaire    Fear of Current or Ex-Partner: No    Emotionally Abused: No    Physically Abused: No    Sexually Abused: No    Laboratory Investigations No components found for: "CMP" No components found for: "BMP" Lab Results  Component Value Date   GFR 96.87 08/31/2010   Lab Results  Component Value Date   CREATININE 0.66 03/09/2023   No results found for: "CBC" No components found for: "LFT" No components found for: "VITD" No results found for: "PTH"  Lab Results  Component Value Date   TSH 1.082 08/19/2022   No components found for: "RENAL FUNCTION" No components found for: "MAGNESIUM"  Parts of this note may have been dictated using voice recognition software. There may be variances in spelling and vocabulary which are unintentional. Not all errors are proofread. Please notify the Thereasa Parkin if any discrepancies are noted or if the meaning of any statement is not clear.

## 2023-06-22 ENCOUNTER — Other Ambulatory Visit: Payer: Self-pay

## 2023-06-22 ENCOUNTER — Ambulatory Visit: Payer: Medicare Other | Admitting: Infectious Disease

## 2023-06-22 DIAGNOSIS — M810 Age-related osteoporosis without current pathological fracture: Secondary | ICD-10-CM

## 2023-06-22 DIAGNOSIS — E559 Vitamin D deficiency, unspecified: Secondary | ICD-10-CM

## 2023-06-22 DIAGNOSIS — M8468XD Pathological fracture in other disease, other site, subsequent encounter for fracture with routine healing: Secondary | ICD-10-CM

## 2023-06-23 ENCOUNTER — Ambulatory Visit: Payer: Medicare Other | Admitting: Infectious Disease

## 2023-06-23 ENCOUNTER — Telehealth: Payer: Self-pay

## 2023-06-23 NOTE — Progress Notes (Unsigned)
Subjective:  Chief complaint: Follow-up for ventriculitis and CSF leak that occurred after neurosurgery also with previously chronic nonhealing wound     Patient ID: [name], female    DOB: 1956-02-14, 67 y.o.   MRN: [id number]  HPI   67 y.o. female with hx of scoliosis surgery with extensive lower thoracic and lumbar fusion roughly 27yrs ago, she suffered ground level fall in late November, where she fractures through all three columns of L3, thus went to OR on 12/9 for stabilization, new  posterolateral instrumentation fusion on June 26, 2022 complicated by Wound dehiscence with exposed hardware and unstable lumbar spine fracture status post revision of lumbar wound with removal of L1-3 4 and 5 lumbar screws and rod and removal of right L1 screw and revision of L1-L5 posterior instrumented fusion on August 09, 2022 and now concern for postoperative wound infection and ventriculitis. She had complicated protracted course in the icu, but eventually was discharged to CIR on vancomycin and cefepime through 3/14 for ventriculitis and hw complicating wound infection. Now changed to doxcycyline plus levofloxacin as of 3/15 while she continued to need medihoney with moistened quaze with sliver hydrofiber to the caudal portion of wound.   Mri of brain on 09/29/22- resolution of ventriculitis.   She was seen by my partner Dr. Drue Second while she was in the inpatient rehab unit.  Follow-up was rescheduled for May   Back pain did not appear to be worse but wound did persist and apparently tunneled cranially.  Her white blood cell count with [facility] was in the 16,000 range an abrupt change from her last labs as an inpatient when it was in the 7000 range.  He did have a mechanical fall and fell on her left knee where she has a prosthetic joint.  When I last saw her we rechecked her inflammatory markers and sed rate had titrated down though CRP was going up to 15.6  WBC recheck  with our lab was 12,000.  In the interim she has been seen by Dr. Johnsie Cancel with neurosurgery and CT of the thoracic and lumbar spine were performed.  These have shown: A new T10 compression fracture with 12% loss of height with unchanged change adjacent chronic T10-T11 T12-L1 posterior element ankylosis with chronic nonfusion at T11-T12, L3 superior endplate nonacute fracture and subtle horizontal fracture of the chronic posterior element of the bone, difficult exclude nondisplaced S3-4 sacral fracture with mild presacral stranding.   Checked labs last time we saw her white blood cell count remained elevated 12,000.  Sed rate and CRP [place] sed rate actually normalized and CRP had gone up.  Changed her evofloxacin and doxycycline to cefdinir and doxycycline.  She is continuing to take these.  She did develop a painful erythematous rash on her hands due to doxycycline use in the context of sun exposure at the beach.  This is subsequently resolved she has a new somewhat purpuric rash on her right arm that came up in the last few days which she is curious about.  She returns to clinic today for follow-up     Past Medical History:  Diagnosis Date   Anemia    Anxiety    Arthritis    Bipolar disorder (HCC)    Depression    GERD (gastroesophageal reflux disease)    Heart murmur    "related to VSD"   High cholesterol    History of blood transfusion    "related to OR" (08/19/2016)   History  of hiatal hernia    Hypertension    Hyperthyroidism    Mild cognitive impairment 09/06/2018   Paroxysmal ventricular tachycardia (HCC)    Type II diabetes mellitus (HCC)    UTI (urinary tract infection)    being treated with Keflex   Ventricular septal defect    Ventriculitis of brain due to bacteria 11/02/2022   Vertebral fracture, osteoporotic (HCC) 01/06/2023    Past Surgical History:  Procedure Laterality Date   ABDOMINAL HYSTERECTOMY     BACK SURGERY     CARDIAC CATHETERIZATION  N/A 06/23/2015   Procedure: Left Heart Cath and Coronary Angiography;  Surgeon: Laurey Morale, MD;  Location: [facility] INVASIVE CV LAB;  Service: Cardiovascular;  Laterality: N/A;   CARDIAC CATHETERIZATION  1960   "VSD was so small; didn't need repaired"   EXAM UNDER ANESTHESIA WITH MANIPULATION OF HIP Right 06/02/2014   dr Jerl Santos   FRACTURE SURGERY     HERNIA REPAIR     HIP CLOSED REDUCTION Right 06/02/2014   Procedure: CLOSED MANIPULATION HIP;  Surgeon: Velna Ochs, MD;  Location: [facility] OR;  Service: Orthopedics;  Laterality: Right;   JOINT REPLACEMENT     JOINT REPLACEMENT     POSTERIOR LUMBAR FUSION 4 LEVEL N/A 06/26/2022   Procedure: Lumbar One To Lumbar Five Posterior Instrumented Fusion;  Surgeon: Jadene Pierini, MD;  Location: [facility] OR;  Service: Neurosurgery;  Laterality: N/A;   REFRACTIVE SURGERY Bilateral    SHOULDER ARTHROSCOPY Right    SHOULDER OPEN ROTATOR CUFF REPAIR Right    SPINAL FUSION  1996   "t10 down to my coccyx   SPINE HARDWARE REMOVAL     TOTAL ABDOMINAL HYSTERECTOMY     TOTAL HIP ARTHROPLASTY Right 05/10/2014   [place]      by dr Cristal Deer olcott   TOTAL KNEE ARTHROPLASTY Left    TOTAL SHOULDER ARTHROPLASTY Left 08/19/2016   Procedure: TOTAL SHOULDER ARTHROPLASTY;  Surgeon: Jones Broom, MD;  Location: [facility] OR;  Service: Orthopedics;  Laterality: Left;  Left total shoulder replacement    Family History  Problem Relation Age of Onset   Other Mother        alive   Stroke Father 93       deceased   Dementia Father    Chorea Maternal Grandfather    Dementia Maternal Aunt    Dementia Maternal Aunt    Heart attack Other        multiple uncles have died with myocardial infarction      Social History   Socioeconomic History   Marital status: Married    Spouse name: Not on file   Number of children: Not on file   Years of education: Not on file   Highest education level: Master's degree (e.g., MA, MS, MEng, MEd, MSW, MBA)  Occupational  History   Occupation: Magazine features editor: SELF-EMPLOYED    Comment: former  Tobacco Use   Smoking status: Former    Types: Cigarettes    Passive exposure: Never   Smokeless tobacco: Never   Tobacco comments:    03/30/21 smokes 2 cigs daily.      08/06/22 Shanda Bumps stated that the patient is not smoking while at [facility] for rehab. [facility]  Vaping Use   Vaping status: Never Used  Substance and Sexual Activity   Alcohol use: No   Drug use: No   Sexual activity: Not Currently    Birth control/protection: Surgical    Comment: Hysterectomy  Other  Topics Concern   Not on file  Social History Narrative   Right handed   Caffeine 2-3 cups daily    Lives at home with husband    Social Determinants of Health   Financial Resource Strain: Not on file  Food Insecurity: No Food Insecurity (06/27/2022)   Hunger Vital Sign    Worried About Running Out of Food in the Last Year: Never true    Ran Out of Food in the Last Year: Never true  Transportation Needs: No Transportation Needs (06/27/2022)   PRAPARE - Administrator, Civil Service (Medical): No    Lack of Transportation (Non-Medical): No  Physical Activity: Not on file  Stress: Not on file  Social Connections: Not on file    No Known Allergies   Current Outpatient Medications:    acetaminophen (TYLENOL) 325 MG tablet, Take 1-2 tablets (325-650 mg total) by mouth every 6 (six) hours as needed for mild pain or headache., Disp: , Rfl:    buprenorphine (BUTRANS) 5 MCG/HR PTWK, Place 1 patch onto the skin once a week., Disp: 4 patch, Rfl: 0   buPROPion (WELLBUTRIN XL) 150 MG 24 hr tablet, Take 1 tablet (150 mg total) by mouth daily., Disp: 30 tablet, Rfl: 0   carvedilol (COREG) 25 MG tablet, Take 1 tablet (25 mg total) by mouth 2 (two) times daily with a meal., Disp: 60 tablet, Rfl: 0   cefdinir (OMNICEF) 300 MG capsule, Take 1 capsule (300 mg total) by mouth 2 (two) times daily., Disp: 60 capsule, Rfl: 5   collagenase  (SANTYL) 250 UNIT/GM ointment, Apply 1 Application topically daily. Apply 2-3 grams to affected area daily., Disp: 30 g, Rfl: 0   diclofenac Sodium (VOLTAREN) 1 % GEL, Apply 2 g topically 4 (four) times daily., Disp: 350 g, Rfl: 0   diphenoxylate-atropine (LOMOTIL) 2.5-0.025 MG tablet, Take 1 tablet by mouth 2 (two) times daily as needed for diarrhea or loose stools., Disp: 30 tablet, Rfl: 0   doxycycline (VIBRA-TABS) 100 MG tablet, Take 1 tablet (100 mg total) by mouth every 12 (twelve) hours., Disp: 60 tablet, Rfl: 4   empagliflozin (JARDIANCE) 10 MG TABS tablet, Take 1 tablet (10 mg total) by mouth daily before breakfast., Disp: 90 tablet, Rfl: 3   famotidine (PEPCID) 20 MG tablet, Take 1 tablet (20 mg total) by mouth 2 (two) times daily., Disp: 60 tablet, Rfl: 0   fluvoxaMINE (LUVOX) 100 MG tablet, Take 1 tablet (100 mg total) by mouth at bedtime., Disp: 30 tablet, Rfl: 0   gabapentin (NEURONTIN) 100 MG capsule, Take 1 capsule (100 mg total) by mouth 3 (three) times daily., Disp: 90 capsule, Rfl: 0   Lactobacillus (ACIDOPHILUS) 100 MG CAPS, Take 1 capsule (100 mg total) by mouth 2 (two) times daily. (Patient not taking: Reported on 06/21/2023), Disp: 60 capsule, Rfl: 0   lamoTRIgine (LAMICTAL) 200 MG tablet, Take 1 tablet (200 mg total) by mouth daily., Disp: 30 tablet, Rfl: 0   levothyroxine (SYNTHROID) 88 MCG tablet, Take 1 tablet (88 mcg total) by mouth daily before breakfast., Disp: 30 tablet, Rfl: 0   losartan (COZAAR) 25 MG tablet, Take 1 tablet (25 mg total) by mouth at bedtime., Disp: 30 tablet, Rfl: 0   melatonin 5 MG TABS, Take 1 tablet (5 mg total) by mouth at bedtime as needed., Disp: 30 tablet, Rfl: 0   metFORMIN (GLUCOPHAGE) 500 MG tablet, Take 1 tablet (500 mg total) by mouth 2 (two) times daily with a meal.,  Disp: 60 tablet, Rfl: 0   methocarbamol (ROBAXIN) 750 MG tablet, Take 1 tablet (750 mg total) by mouth every 8 (eight) hours as needed for muscle spasms., Disp: 90 tablet, Rfl:  1   Multiple Vitamin (MULTIVITAMIN) tablet, Take 1 tablet by mouth daily.  , Disp: , Rfl:    ondansetron (ZOFRAN) 4 MG tablet, Take 1 tablet (4 mg total) by mouth every 8 (eight) hours as needed for refractory nausea / vomiting., Disp: 20 tablet, Rfl: 0   potassium chloride (KLOR-CON M) 10 MEQ tablet, Take 3 tablets (30 mEq total) by mouth 2 (two) times daily., Disp: 120 tablet, Rfl: 0   rosuvastatin (CRESTOR) 20 MG tablet, Take 20 mg by mouth daily., Disp: , Rfl:    sodium chloride 1 g tablet, Take 1 tablet (1 g total) by mouth 2 (two) times daily with a meal., Disp: 60 tablet, Rfl: 0   traZODone (DESYREL) 100 MG tablet, Take 1 tablet (100 mg total) by mouth at bedtime., Disp: 30 tablet, Rfl: 0   Review of Systems  Constitutional:  Negative for activity change, appetite change, chills, diaphoresis, fatigue, fever and unexpected weight change.  HENT:  Negative for congestion, rhinorrhea, sinus pressure, sneezing, sore throat and trouble swallowing.   Eyes:  Negative for photophobia and visual disturbance.  Respiratory:  Negative for cough, chest tightness, shortness of breath, wheezing and stridor.   Cardiovascular:  Negative for chest pain, palpitations and leg swelling.  Gastrointestinal:  Negative for abdominal distention, abdominal pain, anal bleeding, blood in stool, constipation, diarrhea, nausea and vomiting.  Genitourinary:  Negative for difficulty urinating, dysuria, flank pain and hematuria.  Musculoskeletal:  Negative for arthralgias, back pain, gait problem, joint swelling and myalgias.  Skin:  Negative for color change, pallor, rash and wound.  Neurological:  Negative for dizziness, tremors, weakness and light-headedness.  Hematological:  Negative for adenopathy. Does not bruise/bleed easily.  Psychiatric/Behavioral:  Negative for agitation, behavioral problems, confusion, decreased concentration, dysphoric mood and sleep disturbance.        Objective:   Physical  Exam Constitutional:      General: She is not in acute distress.    Appearance: Normal appearance. She is well-developed. She is not ill-appearing or diaphoretic.  HENT:     Head: Normocephalic and atraumatic.     Right Ear: Hearing and external ear normal.     Left Ear: Hearing and external ear normal.     Nose: No nasal deformity or rhinorrhea.  Eyes:     General: No scleral icterus.    Conjunctiva/sclera: Conjunctivae normal.     Right eye: Right conjunctiva is not injected.     Left eye: Left conjunctiva is not injected.     Pupils: Pupils are equal, round, and reactive to light.  Neck:     Vascular: No JVD.  Cardiovascular:     Rate and Rhythm: Normal rate and regular rhythm.     Heart sounds: S1 normal and S2 normal.  Pulmonary:     Effort: Pulmonary effort is normal. No respiratory distress.     Breath sounds: No wheezing.  Abdominal:     Palpations: Abdomen is soft.     Tenderness: There is no abdominal tenderness.  Musculoskeletal:        General: Normal range of motion.     Right shoulder: Normal.     Left shoulder: Normal.     Cervical back: Normal range of motion and neck supple.     Right hip: Normal.  Left hip: Normal.     Right knee: Normal.     Left knee: Normal.  Lymphadenopathy:     Head:     Right side of head: No submandibular, preauricular or posterior auricular adenopathy.     Left side of head: No submandibular, preauricular or posterior auricular adenopathy.     Cervical: No cervical adenopathy.     Right cervical: No superficial or deep cervical adenopathy.    Left cervical: No superficial or deep cervical adenopathy.  Skin:    General: Skin is warm and dry.     Coloration: Skin is not pale.     Findings: No abrasion, bruising, ecchymosis, erythema, lesion or rash.     Nails: There is no clubbing.  Neurological:     Mental Status: She is alert and oriented to person, place, and time.     Sensory: No sensory deficit.     Coordination:  Coordination normal.     Gait: Gait normal.  Psychiatric:        Attention and Perception: She is attentive.        Mood and Affect: Mood normal.        Speech: Speech normal.        Behavior: Behavior normal. Behavior is cooperative.        Thought Content: Thought content normal.        Judgment: Judgment normal.    Wound 11/02/2022:     Wound 12/09/2022:    Wound 01/06/2023:       Picture today and was nearly completely closed up  Rash 03/09/2023:    Assessment & Plan:    23   67 y.o. female with recent vertebral fracture status post hardware placement which was then exposed removed with revision complicated by CSF leak then admitted with fevers found to have postoperative fluid collection multifocal pneumonia thought to be due to aspiration and possible meningitis and ventricultis  The presence of hardware again we would like her to be on protracted antibiotics and we will try to push into February with the doxycycline and the cefdinir we will check a sed rate CRP CBC with differential and a CMP today.  Rash on arm  Not clear what this is.  See cycle can certainly cause the sun exposure rash that she previously had and can cause a hyperpigmented dictated rash but this does not seem particularly consistent with either of those.  I will check a CBC as I mentioned CMP and check coagulation factors as well.  Doxycycline is rash: Resolved.  Osteoporosis referral made to endocrinology

## 2023-06-23 NOTE — Telephone Encounter (Signed)
Attempted to call patient regarding missed appointment today. Left voicemail requesting call back to reschedule. Will send mychart message.  Juanita Laster, RMA

## 2023-06-24 ENCOUNTER — Other Ambulatory Visit: Payer: Self-pay | Admitting: "Endocrinology

## 2023-06-24 ENCOUNTER — Telehealth: Payer: Self-pay

## 2023-06-24 DIAGNOSIS — M810 Age-related osteoporosis without current pathological fracture: Secondary | ICD-10-CM

## 2023-06-24 DIAGNOSIS — M4850XA Collapsed vertebra, not elsewhere classified, site unspecified, initial encounter for fracture: Secondary | ICD-10-CM | POA: Insufficient documentation

## 2023-06-24 DIAGNOSIS — M4850XD Collapsed vertebra, not elsewhere classified, site unspecified, subsequent encounter for fracture with routine healing: Secondary | ICD-10-CM

## 2023-06-24 LAB — VITAMIN D 25 HYDROXY (VIT D DEFICIENCY, FRACTURES): Vit D, 25-Hydroxy: 32 ng/mL (ref 30–100)

## 2023-06-24 LAB — PROCOLLAGEN TYPE I INTACT N TERMINAL PROPEPTIDE: Procollagen Type I Intact N Terminal Propeptide: 68 ug/L

## 2023-06-24 LAB — C-TERMINAL TELOPEPTIDE: C-Telopeptide (CTx): 136 pg/mL

## 2023-06-24 NOTE — Telephone Encounter (Signed)
Dr. Roosevelt Locks, patient will be scheduled as soon as possible.  Auth Submission: NO AUTH NEEDED Site of care: Site of care: [facility] Payer: [employer] Medication & CPT/J Code(s) submitted: Reclast (Zolendronic acid) [id number] Route of submission (phone, fax, portal):  Phone # Fax # Auth type: Buy/Bill PB Units/visits requested: 5mg  x 1 dose Reference number:  Approval from: 06/24/23 to 07/19/23

## 2023-06-30 ENCOUNTER — Encounter: Payer: Medicare Other | Attending: Physical Medicine & Rehabilitation | Admitting: Physical Medicine & Rehabilitation

## 2023-06-30 ENCOUNTER — Encounter: Payer: Self-pay | Admitting: Physical Medicine & Rehabilitation

## 2023-06-30 VITALS — BP 110/69 | HR 80 | Ht 61.0 in | Wt 135.0 lb

## 2023-06-30 DIAGNOSIS — S22070D Wedge compression fracture of T9-T10 vertebra, subsequent encounter for fracture with routine healing: Secondary | ICD-10-CM | POA: Diagnosis present

## 2023-06-30 DIAGNOSIS — Z79891 Long term (current) use of opiate analgesic: Secondary | ICD-10-CM | POA: Diagnosis not present

## 2023-06-30 DIAGNOSIS — Z79899 Other long term (current) drug therapy: Secondary | ICD-10-CM | POA: Insufficient documentation

## 2023-06-30 DIAGNOSIS — G894 Chronic pain syndrome: Secondary | ICD-10-CM | POA: Insufficient documentation

## 2023-06-30 NOTE — Progress Notes (Signed)
Subjective:    Patient ID: [name], female    DOB: 1956/02/15, 67 y.o.   MRN: [id number]  HPI  HPI   Excerpt from discharge summary 10/13/2022 Brief HPI:   [name] is a 67 y.o. female with history of PVT, T2DM, bipolar d/o, mild cognitive impairments, right femur fracture with RLE leg length discrepancy and gait disorder, fall 05/2022 with 3 column unstable fracture through prior scoliosis construct s/p instrumentation with exposed hardware and wound dehiscence. She was admitted on 08/09/22 for revision of lumbar wound with removal of hardware and revision L1-L5 fusion by Dr. Maurice Small. Post op course was significant for delirium with fevers, PNA with respiratory failure requiring intubation 2/01. MRI brain showed evidence of ventriculitis and MRI spine showed widely patent fusion with decrease in bone marrow edema L3.    Dr. Daiva Eves recommended IV antibiotics X 6 weeks empirically with flagyl to cover aspiration PNA. She required tracheostomy as well as foley for urinary retention and rectal tube due to frequent loose stools and to prevent wound contamination. WOC was following for wound VAC changes. She was downsized to CFS#4 prior prior to d/c. PT/OT was working with patient who was limited by weakness, cognitive deficits requiring increased time for processing as well as verbal and tactile cues to follow simple commands as well as max to total assist with ADLs and mobility. CIR was recommended due to functional decline.       Hospital Course: [name] was admitted to rehab 09/03/2022 for inpatient therapies to consist of PT, ST and OT at least three hours five days a week. Past admission physiatrist, therapy team and rehab RN have worked together to provide customized collaborative inpatient rehab. Her blood pressures were monitored on TID basis and have been stable. As mentation and swallow function improved, diet was advanced to regular textures. She was  tolerating PMSV and was decannulated without difficulty. Her diabetes has been monitored with ac/hs CBG checks and was being managed with SSI due to variable intake and was reasonably controlled. As swallow function improved, she was advanced to regular textures, intake has improved with family providing food from home. She did request resumption of metformin and d/c of CBG checks. She is tolerating metformin without SE except for am nausea which could be due to transition to oral antibiotics. Patient has been educated on importance of BS management to promote wound healing and recommended to  monitor this post d/c. Her CBG checks d/c as BS controlled and have been monitored weekly with weekly labs.    She was maintained on Vanc/cefepime thru 03/14 and as follow up MRI brain 03/13 showed  resolution of ventriculitis, she was transitioned to Levaquin and doxycycline bid per ID input. She is to continue on oral antibiotics X 4 weeks and follow up at [facility] on 04/16 for final decision on duration of antibiotic treatment. WOC has been following to give input on patient's wound care and for monitoring. Wound VAC was discontinued on 02/21 and wet to dry dressing changes with Aquacel Ag was initiated. She did develop some slough on wound bed and metahoney added initially but changed to santyl which has helped to debride it. Patient did report that she plans  on showering at home and WOC recommended changing back to Aquacel for antibacterial protection.    Ensure and protein supplements added to help promote wound healing as as mentation improved but she had declined these frequently.  Voiding trial was attempted with  increase in tamsulosin as well as addition of urecholine. She continued to require I/O caths and patient elected on  foley with plans for voiding trial on outpatient basis.  She has had high levels of anxiety and ego support has been provided by rehab team. Her mentation has improved with cognition trending  back to baseline. She expressed concerns about her psychiatric medication regimen as multiple meds has been d/c while on acute and requested psychiatry input. Her home Wellbutrin was resumed on 03/21 and Dr. Viviano Simas who evaluated patient felt that patient was at her baseline and no additional meds were recommended.    Pain control is improving and she has been educated on importance of weaning narcotics gradually. She continues Butran's patch as well as low dose gabapentin as sedation was reported with attempts at up titration of latter.   She started making progress and LOS was extended by a week to help achieve higher goals. She requires min assist with ADL and mobility. She will continue to receive follow up HHPT, HHOT, HHST, HHaide and HHRN by [facility] after discharge.      Rehab course: During patient's stay in rehab weekly team conferences were held to monitor patient's progress, set goals and discuss barriers to discharge. At admission, patient required +2 total assist with ADL tasks and mod to total assist with pregait activity. She exhibited deficits in recall, attention, insight and required min to mod assist for processing and attention to tasks. She  has had improvement in activity tolerance, balance, postural control as well as ability to compensate for deficits. She has had improvement in functional use RUE and RLE as well as improvement in awareness. She requires min assist with ADL tasks.  She requires CGA for transfers and to ambulate 42 feet with RW and cues for sequencing.  She is able to complete functional cognitive tasks with min assist and requires min cues to self correct errors and for re-direction to tasks. Family education has been completed and family has hired caregivers to assist with care.     Interval history 12/10/2022 [name] is here for follow-up after CIR and above treatment.  She was also seen by Jacalyn Lefevre on 11/09/2022.  She reports she is continue to  work with physical therapy however she was discharged by OT.  She says after several weeks she may be able to transition to outpatient therapy.  Foley has been removed and she is voiding on her own, this does require to get up more frequently.  Her back pain is currently controlled with gabapentin, buprenorphine that was increased to 10 mcg/h patch and as needed oxycodone.  She has been seen by Dr. Daiva Eves infectious disease and is continued on antibiotics of doxycycline and Levaquin.  She continues to follow-up with Dr. Johnsie Cancel.  She had a CT of her T-spine and L-spine recently that showed a new compression fracture.   Interval history 03/15/2023 Ms. Cartagena is here for follow-up regarding conditions listed above.  She reports she is doing well overall.  She continues to work with physical therapy and Occupational Therapy and feels like she is making good progress.  She says she is discharged from her wound care clinic because her lower back wound is now closed.  She is no longer on tramadol, buprenorphine or oxycodone and is now on Flexeril for her back pain.  She feels like therapy is causing some soreness in her back.  Pain does not shoot down her legs.  Reports  she has a TENS and this provides minimal benefit.   Interval history 06/30/23 Patient is here for follow-up.  She reports she was doing very well with outpatient PT and OT and making good progress.  She later developed worsening thoracic pain and was found to have a T10 compression fracture.  She reports Dr. Johnsie Cancel has retired but she is planning to follow-up with Dr. Jake Samples. She has been treated with Norco 5.  She reports she would like to restart PT after she has her potential kyphoplasty.  Pain Inventory Average Pain 9 Pain Right Now 8 My pain is constant, sharp, and aching  In the last 24 hours, has pain interfered with the following? General activity 10 Relation with others 9 Enjoyment of life 9 What TIME of day is your pain  at its worst? daytime and evening Sleep (in general) Poor  Pain is worse with: bending, sitting, and standing Pain improves with: rest and medication Relief from Meds: 3  Family History  Problem Relation Age of Onset   Other Mother        alive   Stroke Father 57       deceased   Dementia Father    Chorea Maternal Grandfather    Dementia Maternal Aunt    Dementia Maternal Aunt    Heart attack Other        multiple uncles have died with myocardial infarction   Social History   Socioeconomic History   Marital status: Married    Spouse name: Not on file   Number of children: Not on file   Years of education: Not on file   Highest education level: Master's degree (e.g., MA, MS, MEng, MEd, MSW, MBA)  Occupational History   Occupation: Magazine features editor: SELF-EMPLOYED    Comment: former  Tobacco Use   Smoking status: Former    Types: Cigarettes    Passive exposure: Never   Smokeless tobacco: Never   Tobacco comments:    03/30/21 smokes 2 cigs daily.      08/06/22 Shanda Bumps stated that the patient is not smoking while at [facility] for rehab. [facility]  Vaping Use   Vaping status: Never Used  Substance and Sexual Activity   Alcohol use: No   Drug use: No   Sexual activity: Not Currently    Birth control/protection: Surgical    Comment: Hysterectomy  Other Topics Concern   Not on file  Social History Narrative   Right handed   Caffeine 2-3 cups daily    Lives at home with husband    Social Drivers of Health   Financial Resource Strain: Not on file  Food Insecurity: No Food Insecurity (06/27/2022)   Hunger Vital Sign    Worried About Running Out of Food in the Last Year: Never true    Ran Out of Food in the Last Year: Never true  Transportation Needs: No Transportation Needs (06/27/2022)   PRAPARE - Administrator, Civil Service (Medical): No    Lack of Transportation (Non-Medical): No  Physical Activity: Not on file  Stress: Not on file  Social  Connections: Not on file   Past Surgical History:  Procedure Laterality Date   ABDOMINAL HYSTERECTOMY     BACK SURGERY     CARDIAC CATHETERIZATION N/A 06/23/2015   Procedure: Left Heart Cath and Coronary Angiography;  Surgeon: Laurey Morale, MD;  Location: [facility] INVASIVE CV LAB;  Service: Cardiovascular;  Laterality: N/A;   CARDIAC CATHETERIZATION  1960   "VSD was so small; didn't need repaired"   EXAM UNDER ANESTHESIA WITH MANIPULATION OF HIP Right 06/02/2014   dr Jerl Santos   FRACTURE SURGERY     HERNIA REPAIR     HIP CLOSED REDUCTION Right 06/02/2014   Procedure: CLOSED MANIPULATION HIP;  Surgeon: Velna Ochs, MD;  Location: [facility] OR;  Service: Orthopedics;  Laterality: Right;   JOINT REPLACEMENT     JOINT REPLACEMENT     POSTERIOR LUMBAR FUSION 4 LEVEL N/A 06/26/2022   Procedure: Lumbar One To Lumbar Five Posterior Instrumented Fusion;  Surgeon: Jadene Pierini, MD;  Location: [facility] OR;  Service: Neurosurgery;  Laterality: N/A;   REFRACTIVE SURGERY Bilateral    SHOULDER ARTHROSCOPY Right    SHOULDER OPEN ROTATOR CUFF REPAIR Right    SPINAL FUSION  1996   "t10 down to my coccyx   SPINE HARDWARE REMOVAL     TOTAL ABDOMINAL HYSTERECTOMY     TOTAL HIP ARTHROPLASTY Right 05/10/2014   [facility]      by dr Cristal Deer olcott   TOTAL KNEE ARTHROPLASTY Left    TOTAL SHOULDER ARTHROPLASTY Left 08/19/2016   Procedure: TOTAL SHOULDER ARTHROPLASTY;  Surgeon: Jones Broom, MD;  Location: [facility] OR;  Service: Orthopedics;  Laterality: Left;  Left total shoulder replacement   Past Surgical History:  Procedure Laterality Date   ABDOMINAL HYSTERECTOMY     BACK SURGERY     CARDIAC CATHETERIZATION N/A 06/23/2015   Procedure: Left Heart Cath and Coronary Angiography;  Surgeon: Laurey Morale, MD;  Location: [facility] INVASIVE CV LAB;  Service: Cardiovascular;  Laterality: N/A;   CARDIAC CATHETERIZATION  1960   "VSD was so small; didn't need repaired"   EXAM UNDER ANESTHESIA WITH MANIPULATION OF  HIP Right 06/02/2014   dr Jerl Santos   FRACTURE SURGERY     HERNIA REPAIR     HIP CLOSED REDUCTION Right 06/02/2014   Procedure: CLOSED MANIPULATION HIP;  Surgeon: Velna Ochs, MD;  Location: [facility] OR;  Service: Orthopedics;  Laterality: Right;   JOINT REPLACEMENT     JOINT REPLACEMENT     POSTERIOR LUMBAR FUSION 4 LEVEL N/A 06/26/2022   Procedure: Lumbar One To Lumbar Five Posterior Instrumented Fusion;  Surgeon: Jadene Pierini, MD;  Location: [facility] OR;  Service: Neurosurgery;  Laterality: N/A;   REFRACTIVE SURGERY Bilateral    SHOULDER ARTHROSCOPY Right    SHOULDER OPEN ROTATOR CUFF REPAIR Right    SPINAL FUSION  1996   "t10 down to my coccyx   SPINE HARDWARE REMOVAL     TOTAL ABDOMINAL HYSTERECTOMY     TOTAL HIP ARTHROPLASTY Right 05/10/2014   [facility]      by dr Cristal Deer olcott   TOTAL KNEE ARTHROPLASTY Left    TOTAL SHOULDER ARTHROPLASTY Left 08/19/2016   Procedure: TOTAL SHOULDER ARTHROPLASTY;  Surgeon: Jones Broom, MD;  Location: [facility] OR;  Service: Orthopedics;  Laterality: Left;  Left total shoulder replacement   Past Medical History:  Diagnosis Date   Anemia    Anxiety    Arthritis    Bipolar disorder (HCC)    Depression    GERD (gastroesophageal reflux disease)    Heart murmur    "related to VSD"   High cholesterol    History of blood transfusion    "related to OR" (08/19/2016)   History of hiatal hernia    Hypertension    Hyperthyroidism    Mild cognitive impairment 09/06/2018   Paroxysmal ventricular tachycardia (HCC)    Type II diabetes  mellitus (HCC)    UTI (urinary tract infection)    being treated with Keflex   Ventricular septal defect    Ventriculitis of brain due to bacteria 11/02/2022   Vertebral fracture, osteoporotic (HCC) 01/06/2023   There were no vitals taken for this visit.  Opioid Risk Score:   Fall Risk Score:  `1  Depression screen [facility] 2/9     03/15/2023   10:54 AM 01/06/2023   10:36 AM 12/10/2022   11:13 AM 12/09/2022    10:42 AM 11/02/2022    3:02 PM  Depression screen PHQ 2/9  Decreased Interest 1 0 0 0 0  Down, Depressed, Hopeless 1 1 0 0 0  PHQ - 2 Score 2 1 0 0 0     Review of Systems  Musculoskeletal:  Positive for back pain.       RT hip  All other systems reviewed and are negative.      Objective:   Physical Exam   Gen: no distress, normal appearing HEENT: oral mucosa pink and moist, NCAT Chest: normal effort, normal rate of breathing Abd: soft, non-distended Ext: no edema Psych: pleasant, normal affect Skin: Warm dry Neuro: Alert and awake, follows commands, cranial 2-12 grossly intact Sitting in wheel chair, moving all 3 extremities to gravity  Sensation intact light touch in all 4 extremities Musculoskeletal: TTP lower T spine with bony abnormality palpated in this area   CT lumbar spine Dec 11, 2022 IMPRESSION: 1. Pronounced chronic osteopenia. Prior decompression and fusion throughout the lumbar spine.   2. L3 superior endplate/superior body non-acute fracture AND subtle associated horizontal fracture through the chronic posterior element bone mass there (series 9, image 53 today). But no displacement over this series of exams.   3. Difficult to exclude nondisplaced S3/S4 sacral fracture, with mild presacral stranding there.   4. But no other acute osseous abnormality identified. And elsewhere the chronic T12 through sacral ankylosis/arthrodesis appears intact.   5. T10 compression fracture is detailed separately today on CT Thoracic Spine.   We discussed getting stay CT thoracic spine Dec 11, 2022 IMPRESSION: 1. T10 compression fracture with 12% loss of height is new since 08/19/2022. No retropulsion or other complicating features. Unchanged adjacent chronic T10-T11 and T12-L1 posterior element ankylosis, with chronic non fusion of T11-T12. 2. No other acute osseous abnormality identified in the thoracic spine. Generalized osteopenia. Chronic mild T5, T6, and  T11 compression fractures. 3. Increased elevation of the left hemidiaphragm and new left lung base consolidation since February. But small bilateral pleural effusions have resolved. 4. No CT evidence of thoracic spinal stenosis.        Assessment & Plan:   Osteomyelitis, infection of lumbar surgical site complicated by meningitis/ventriculitis.              -Continue to follow-up with NSGY, She reports Dr. Johnsie Cancel has retired and she has new doctor assigned              -Continue to follow-up with infectious disease- reports she is still on ABX  -Discussed trying probiotic as she is on chronic abx   Spinal compression fracture with lower back pain. Complex scoliosis   -Pt reports new compression fracture T10 followed by NSGY with possible plan for T10 Kyphyplasty, followed by Dr. Jake Samples             -She is previously on buprenorphine, oxycodone and later tramadol  -She has now on Norco 5mg  for pain control   -Pt would like 2nd opinion with [facility]  neurosurgery and spine-consult placed  -Consider repeat PT/OT at a later time potentially after kyphoplasty   Urinary retention             -Improved

## 2023-07-01 ENCOUNTER — Telehealth: Payer: Self-pay | Admitting: Physical Medicine & Rehabilitation

## 2023-07-01 NOTE — Telephone Encounter (Signed)
Patient called in requesting referral to [facility] to see Dr Arlis Porta

## 2023-07-04 NOTE — Addendum Note (Signed)
Addended by: Fanny Dance on: 07/04/2023 10:29 PM   Modules accepted: Orders

## 2023-07-05 ENCOUNTER — Other Ambulatory Visit (HOSPITAL_COMMUNITY): Payer: Self-pay | Admitting: Surgery

## 2023-07-05 DIAGNOSIS — S32001D Stable burst fracture of unspecified lumbar vertebra, subsequent encounter for fracture with routine healing: Secondary | ICD-10-CM

## 2023-07-05 DIAGNOSIS — S22070A Wedge compression fracture of T9-T10 vertebra, initial encounter for closed fracture: Secondary | ICD-10-CM

## 2023-07-06 ENCOUNTER — Other Ambulatory Visit: Payer: Self-pay | Admitting: Infectious Disease

## 2023-07-06 NOTE — Telephone Encounter (Signed)
error 

## 2023-07-10 ENCOUNTER — Ambulatory Visit (HOSPITAL_COMMUNITY)
Admission: RE | Admit: 2023-07-10 | Discharge: 2023-07-10 | Disposition: A | Discharge status: Home / Self Care | Payer: Medicare Other | Source: Ambulatory Visit | Attending: Surgery | Admitting: Surgery

## 2023-07-10 DIAGNOSIS — S22070A Wedge compression fracture of T9-T10 vertebra, initial encounter for closed fracture: Secondary | ICD-10-CM | POA: Insufficient documentation

## 2023-07-10 DIAGNOSIS — S32001D Stable burst fracture of unspecified lumbar vertebra, subsequent encounter for fracture with routine healing: Secondary | ICD-10-CM | POA: Diagnosis present

## 2023-07-10 MED ORDER — GADOBUTROL 1 MMOL/ML IV SOLN
6.0000 mL | Freq: Once | INTRAVENOUS | Status: AC | PRN
  Administered 2023-07-10: 6 mL via INTRAVENOUS

## 2023-07-13 NOTE — Progress Notes (Unsigned)
Subjective:  Chief complaint: followup for hardware associated Lumbar infection, ventriculitis, T spine compression fracture and lung mass   Patient ID: [name], female    DOB: [birth year], 67 y.o.   MRN: [id number]  HPI   67 y.o. female with hx of scoliosis surgery with extensive lower thoracic and lumbar fusion roughly 14yrs ago, she suffered ground level fall in late November, where she fractures through all three columns of L3, thus went to OR on 12/9 for stabilization, new  posterolateral instrumentation fusion on June 26, 2022 complicated by Wound dehiscence with exposed hardware and unstable lumbar spine fracture status post revision of lumbar wound with removal of L1-3 4 and 5 lumbar screws and rod and removal of right L1 screw and revision of L1-L5 posterior instrumented fusion on August 09, 2022 and now concern for postoperative wound infection and ventriculitis. She had complicated protracted course in the icu, but eventually was discharged to CIR on vancomycin and cefepime through 3/14 for ventriculitis and hw complicating wound infection. Now changed to doxcycyline plus levofloxacin as of 3/15 while she continued to need medihoney with moistened quaze with sliver hydrofiber to the caudal portion of wound.   Mri of brain on 09/29/22- resolution of ventriculitis.   She was seen by my partner Dr. Drue Second while she was in the inpatient rehab unit.  Follow-up was rescheduled for May 2024.  Interim history:  "Back pain did not appear to be worse but wound did persist and apparently tunneled cranially.  Her white blood cell count with [facility] was in the 16,000 range an abrupt change from her last labs as an inpatient when it was in the 7000 range.  He did have a mechanical fall and fell on her left knee where she has a prosthetic joint.  When I last saw her we rechecked her inflammatory markers and sed rate had titrated down though CRP was going up to 15.6  WBC  recheck with our lab was 12,000.  In the interim she has been seen by Dr. Johnsie Cancel with neurosurgery and CT of the thoracic and lumbar spine were performed.  These have shown: A new T10 compression fracture with 12% loss of height with unchanged change adjacent chronic T10-T11 T12-L1 posterior element ankylosis with chronic nonfusion at T11-T12, L3 superior endplate nonacute fracture and subtle horizontal fracture of the chronic posterior element of the bone, difficult exclude nondisplaced S3-4 sacral fracture with mild presacral stranding.   Checked labs last time we saw her white blood cell count remained elevated 12,000.  Sed rate and CRP Satteson sed rate actually normalized and CRP had gone up.  Changed her evofloxacin and doxycycline to cefdinir and doxycycline.  She is continuing to take these.  She did develop a painful erythematous rash on her hands due to doxycycline use in the context of sun exposure at the beach.  This is subsequently resolved she has a new somewhat purpuric rash on her right arm that came up in the last few days " She returns to clinic today for follow-up  Discussed the use of AI scribe software for clinical note transcription with the patient, who gave verbal consent to proceed.  History of Present Illness   The patient, with a complex medical history including spinal fusion and osteoporosis, presents with increased pain in the thoracic spine and hip. The patient reports that the pain has worsened over the past few weeks, leading to increased use of pain medication including hydrocodone and a buprenorphine patch.  The patient also reports a decrease in physical strength and mobility, with a regression from using a walker to needing a cane.  In addition to the increased pain, the patient has been on long-term antibiotics due to a as mentioned above. The patient reports that the antibiotics have been upsetting their stomach. Recent imaging has raised concerns  about a possible infection in the spine, and the patient is awaiting the results of a recent MRI T and L spine.   She has seen Neurosurgery at [facility] recently as well as Dr. Jake Samples at [facility] here.    She has had right hip pain as well and seen Dr. Jerl Santos for this who gave hera  steroid injection with improvement in pain here  Furthermore, the patient has a new finding of an 8mm solid mass in the right lung. The patient is scheduled to see a pulmonologist for further evaluation of this finding. The patient expresses a desire for a biopsy to determine the nature of the mass.         Past Medical History:  Diagnosis Date   Anemia    Anxiety    Arthritis    Bipolar disorder (HCC)    Depression    GERD (gastroesophageal reflux disease)    Heart murmur    "related to VSD"   High cholesterol    History of blood transfusion    "related to OR" (08/19/2016)   History of hiatal hernia    Hypertension    Hyperthyroidism    Mild cognitive impairment 09/06/2018   Paroxysmal ventricular tachycardia (HCC)    Type II diabetes mellitus (HCC)    UTI (urinary tract infection)    being treated with Keflex   Ventricular septal defect    Ventriculitis of brain due to bacteria 11/02/2022   Vertebral fracture, osteoporotic (HCC) 01/06/2023    Past Surgical History:  Procedure Laterality Date   ABDOMINAL HYSTERECTOMY     BACK SURGERY     CARDIAC CATHETERIZATION N/A 06/23/2015   Procedure: Left Heart Cath and Coronary Angiography;  Surgeon: Laurey Morale, MD;  Location: [facility] INVASIVE CV [facility];  Service: Cardiovascular;  Laterality: N/A;   CARDIAC CATHETERIZATION  1960   "VSD was so small; didn't need repaired"   EXAM UNDER ANESTHESIA WITH MANIPULATION OF HIP Right 06/02/2014   dr Jerl Santos   FRACTURE SURGERY     HERNIA REPAIR     HIP CLOSED REDUCTION Right 06/02/2014   Procedure: CLOSED MANIPULATION HIP;  Surgeon: Velna Ochs, MD;  Location: [facility] OR;  Service: Orthopedics;  Laterality: Right;    JOINT REPLACEMENT     JOINT REPLACEMENT     POSTERIOR LUMBAR FUSION 4 LEVEL N/A 06/26/2022   Procedure: Lumbar One To Lumbar Five Posterior Instrumented Fusion;  Surgeon: Jadene Pierini, MD;  Location: [facility] OR;  Service: Neurosurgery;  Laterality: N/A;   REFRACTIVE SURGERY Bilateral    SHOULDER ARTHROSCOPY Right    SHOULDER OPEN ROTATOR CUFF REPAIR Right    SPINAL FUSION  1996   "t10 down to my coccyx   SPINE HARDWARE REMOVAL     TOTAL ABDOMINAL HYSTERECTOMY     TOTAL HIP ARTHROPLASTY Right 05/10/2014   [place]      by dr Cristal Deer olcott   TOTAL KNEE ARTHROPLASTY Left    TOTAL SHOULDER ARTHROPLASTY Left 08/19/2016   Procedure: TOTAL SHOULDER ARTHROPLASTY;  Surgeon: Jones Broom, MD;  Location: [facility] OR;  Service: Orthopedics;  Laterality: Left;  Left total shoulder replacement    Family  History  Problem Relation Age of Onset   Other Mother        alive   Stroke Father 61       deceased   Dementia Father    Chorea Maternal Grandfather    Dementia Maternal Aunt    Dementia Maternal Aunt    Heart attack Other        multiple uncles have died with myocardial infarction      Social History   Socioeconomic History   Marital status: Married    Spouse name: Not on file   Number of children: Not on file   Years of education: Not on file   Highest education level: Master's degree (e.g., MA, MS, MEng, MEd, MSW, MBA)  Occupational History   Occupation: Magazine features editor: SELF-EMPLOYED    Comment: former  Tobacco Use   Smoking status: Former    Types: Cigarettes    Passive exposure: Never   Smokeless tobacco: Never   Tobacco comments:    03/30/21 smokes 2 cigs daily.      08/06/22 Shanda Bumps stated that the patient is not smoking while at [facility] for rehab. [facility]  Vaping Use   Vaping status: Never Used  Substance and Sexual Activity   Alcohol use: No   Drug use: No   Sexual activity: Not Currently    Birth control/protection: Surgical    Comment: Hysterectomy   Other Topics Concern   Not on file  Social History Narrative   Right handed   Caffeine 2-3 cups daily    Lives at home with husband    Social Drivers of Health   Financial Resource Strain: Low Risk  (07/08/2023)   [facility]   Overall Financial Resource Strain (CARDIA)    Difficulty of Paying Living Expenses: Not hard at all  Food Insecurity: No Food Insecurity (07/08/2023)   Received from [facility]   Hunger Vital Sign    Worried About Running Out of Food in the Last Year: Never true    Ran Out of Food in the Last Year: Never true  Transportation Needs: No Transportation Needs (07/08/2023)   Received from [facility] - Transportation    In the past 12 months, has lack of transportation kept you from medical appointments or from getting medications?: No    Lack of Transportation (Non-Medical): No  Physical Activity: Not on file  Stress: Not on file  Social Connections: Not on file    No Known Allergies   Current Outpatient Medications:    acetaminophen (TYLENOL) 325 MG tablet, Take 1-2 tablets (325-650 mg total) by mouth every 6 (six) hours as needed for mild pain or headache., Disp: , Rfl:    buPROPion (WELLBUTRIN XL) 150 MG 24 hr tablet, Take 1 tablet (150 mg total) by mouth daily., Disp: 30 tablet, Rfl: 0   carvedilol (COREG) 25 MG tablet, Take 1 tablet (25 mg total) by mouth 2 (two) times daily with a meal., Disp: 60 tablet, Rfl: 0   cefdinir (OMNICEF) 300 MG capsule, TAKE 1 CAPSULE(300 MG) BY MOUTH TWICE DAILY, Disp: 60 capsule, Rfl: 0   cyclobenzaprine (FLEXERIL) 10 MG tablet, Take 10 mg by mouth 3 (three) times daily., Disp: , Rfl:    cyclobenzaprine (FLEXERIL) 10 MG tablet, Take 1 tablet by mouth 3 (three) times daily. (Patient not taking: Reported on 06/30/2023), Disp: , Rfl:    diazepam (VALIUM) 5 MG tablet, as  needed. (Patient not taking: Reported on 06/30/2023), Disp: , Rfl:     diphenoxylate-atropine (LOMOTIL) 2.5-0.025 MG tablet, Take 1 tablet by mouth 2 (two) times daily as needed for diarrhea or loose stools., Disp: 30 tablet, Rfl: 0   donepezil (ARICEPT) 10 MG tablet, Take 10 mg by mouth every morning., Disp: , Rfl:    doxycycline (VIBRA-TABS) 100 MG tablet, Take 1 tablet (100 mg total) by mouth every 12 (twelve) hours., Disp: 60 tablet, Rfl: 4   empagliflozin (JARDIANCE) 10 MG TABS tablet, Take 1 tablet (10 mg total) by mouth daily before breakfast., Disp: 90 tablet, Rfl: 3   etodolac (LODINE) 400 MG tablet, Take 400 mg by mouth every 8 (eight) hours as needed., Disp: , Rfl:    famotidine (PEPCID) 20 MG tablet, Take 1 tablet (20 mg total) by mouth 2 (two) times daily., Disp: 60 tablet, Rfl: 0   fluvoxaMINE (LUVOX) 100 MG tablet, Take 1 tablet (100 mg total) by mouth at bedtime., Disp: 30 tablet, Rfl: 0   gabapentin (NEURONTIN) 100 MG capsule, Take 1 capsule (100 mg total) by mouth 3 (three) times daily., Disp: 90 capsule, Rfl: 0   HYDROcodone-acetaminophen (NORCO/VICODIN) 5-325 MG tablet, Take by mouth., Disp: , Rfl:    Lactobacillus (ACIDOPHILUS) 100 MG CAPS, Take 1 capsule (100 mg total) by mouth 2 (two) times daily., Disp: 60 capsule, Rfl: 0   lamoTRIgine (LAMICTAL) 100 MG tablet, Take 200 mg by mouth at bedtime. (Patient not taking: Reported on 06/30/2023), Disp: , Rfl:    lamoTRIgine (LAMICTAL) 200 MG tablet, Take 1 tablet (200 mg total) by mouth daily., Disp: 30 tablet, Rfl: 0   levothyroxine (SYNTHROID) 88 MCG tablet, Take 1 tablet (88 mcg total) by mouth daily before breakfast., Disp: 30 tablet, Rfl: 0   losartan (COZAAR) 25 MG tablet, Take 1 tablet (25 mg total) by mouth at bedtime., Disp: 30 tablet, Rfl: 0   melatonin 5 MG TABS, Take 1 tablet (5 mg total) by mouth at bedtime as needed., Disp: 30 tablet, Rfl: 0   metFORMIN (GLUCOPHAGE) 500 MG tablet, Take 1 tablet (500 mg total) by mouth 2 (two) times daily with a meal., Disp: 60 tablet, Rfl: 0   Multiple  Vitamin (MULTIVITAMIN) tablet, Take 1 tablet by mouth daily.  , Disp: , Rfl:    ondansetron (ZOFRAN) 4 MG tablet, Take 1 tablet (4 mg total) by mouth every 8 (eight) hours as needed for refractory nausea / vomiting., Disp: 20 tablet, Rfl: 0   PAXLOVID, 300/100, 20 x 150 MG & 10 x 100MG  TBPK, Take by mouth. (Patient not taking: Reported on 06/30/2023), Disp: , Rfl:    potassium chloride (KLOR-CON) 10 MEQ tablet, Take 30 mEq by mouth 2 (two) times daily., Disp: , Rfl:    RESTASIS MULTIDOSE 0.05 % ophthalmic emulsion, 1 drop 2 (two) times daily., Disp: , Rfl:    rosuvastatin (CRESTOR) 20 MG tablet, 1 tablet by mouth Once a day for 30 days, Disp: , Rfl:    sodium chloride 1 g tablet, Take 1 tablet (1 g total) by mouth 2 (two) times daily with a meal., Disp: 60 tablet, Rfl: 0   traMADol (ULTRAM) 50 MG tablet, Take 50 mg by mouth every 6 (six) hours as needed. (Patient not taking: Reported on 06/30/2023), Disp: , Rfl:    traZODone (DESYREL) 100 MG tablet, Take 1 tablet (100 mg total) by mouth at bedtime. (Patient not taking: Reported on 06/30/2023), Disp: 30 tablet, Rfl: 0   traZODone (DESYREL) 50  MG tablet, Take 50-100 mg by mouth at bedtime. (Patient not taking: Reported on 06/30/2023), Disp: , Rfl:    Review of Systems  Constitutional:  Positive for activity change and appetite change. Negative for chills, diaphoresis, fatigue and fever.  HENT:  Negative for congestion, rhinorrhea, sinus pressure, sneezing, sore throat and trouble swallowing.   Eyes:  Negative for photophobia and visual disturbance.  Respiratory:  Negative for cough, chest tightness, shortness of breath, wheezing and stridor.   Cardiovascular:  Negative for chest pain, palpitations and leg swelling.  Gastrointestinal:  Negative for abdominal distention, abdominal pain, anal bleeding, blood in stool, constipation, diarrhea, nausea and vomiting.  Genitourinary:  Negative for difficulty urinating, dysuria, flank pain and hematuria.   Musculoskeletal:  Positive for back pain and myalgias. Negative for arthralgias, gait problem and joint swelling.  Skin:  Negative for color change, pallor, rash and wound.  Neurological:  Negative for dizziness, tremors, weakness and light-headedness.  Hematological:  Negative for adenopathy. Does not bruise/bleed easily.  Psychiatric/Behavioral:  Negative for agitation, behavioral problems, confusion, decreased concentration, dysphoric mood and sleep disturbance.        Objective:   Physical Exam Constitutional:      General: She is not in acute distress.    Appearance: Normal appearance. She is well-developed. She is not ill-appearing or diaphoretic.  HENT:     Head: Normocephalic and atraumatic.     Right Ear: Hearing and external ear normal.     Left Ear: Hearing and external ear normal.     Nose: No nasal deformity or rhinorrhea.  Eyes:     General: No scleral icterus.    Conjunctiva/sclera: Conjunctivae normal.     Right eye: Right conjunctiva is not injected.     Left eye: Left conjunctiva is not injected.     Pupils: Pupils are equal, round, and reactive to light.  Neck:     Vascular: No JVD.  Cardiovascular:     Rate and Rhythm: Normal rate and regular rhythm.     Heart sounds: Normal heart sounds, S1 normal and S2 normal. No murmur heard.    No friction rub.  Abdominal:     General: Bowel sounds are normal. There is no distension.     Palpations: Abdomen is soft.     Tenderness: There is no abdominal tenderness.  Musculoskeletal:     Right shoulder: Normal.     Left shoulder: Normal.     Cervical back: Normal range of motion and neck supple.     Right hip: Normal.     Left hip: Normal.     Right knee: Normal.     Left knee: Normal.  Lymphadenopathy:     Head:     Right side of head: No submandibular, preauricular or posterior auricular adenopathy.     Left side of head: No submandibular, preauricular or posterior auricular adenopathy.     Cervical: No  cervical adenopathy.     Right cervical: No superficial or deep cervical adenopathy.    Left cervical: No superficial or deep cervical adenopathy.  Skin:    General: Skin is warm and dry.     Coloration: Skin is not pale.     Findings: No abrasion, bruising, ecchymosis, erythema, lesion or rash.     Nails: There is no clubbing.  Neurological:     Mental Status: She is alert and oriented to person, place, and time.     Sensory: No sensory deficit.     Coordination: Coordination  normal.     Gait: Gait normal.  Psychiatric:        Attention and Perception: She is attentive.        Mood and Affect: Mood normal.        Speech: Speech normal.        Behavior: Behavior normal. Behavior is cooperative.        Thought Content: Thought content normal.        Judgment: Judgment normal.      Wound in T spine area is resolved       Assessment & Plan:   Assessment and Plan    Spinal Infection Concern for new/worsening infection in the spine based on MRI findings and elevated inflammatory markers (Sed rate 48, CRP 68). Patient has been on long-term antibiotics (Doxycycline and Cefdinir recently ) for 10 months +  - Urgently request radiology to read the recent MRI with and without contrast. - If MRI shows evidence of infection, discuss with neurosurgery (Dr. Jake Samples) regarding possible surgical intervention would be indicated   or IR guided biopsy. Note patient would want surgery done at [facility] if she needs surgery - Continue current antibiotics until further information from MRI is available. --if biopsy is pursued will have her hold her antibiotics for 2 weeks prior to increase yield on cultures   Ventriculitis: resolved  Lung Mass 8mm solid mass in the right lung, currently under evaluation by pulmonology. - Order blood and urine tests for dimorphic fungi (Cryptococcus, Histoplasma, Blastomyces, Coccidioidomycosis) as a potential cause of the lung mass.  Chronic Pain Worsening pain  in the thoracic and lumbar spine and hip, likely related to multiple factors including osteoporosis, scoliosis, spinal fusion, hip replacement, and possible spinal infection. - Continue current pain management regimen (Hydrocodone, Buprenorphine patch, Flexeril). it is disoncerting that pain is worse in that this could mean new or worwsening infection in the spine   I called [facility] and they will do a stat read today and I will connect to Dr. Jake Samples after this.  Follow-up in 1 month to reassess the situation and make further plans based on the results of the MRI and fungal tests.      I have personally spent 41 minutes involved in face-to-face and non-face-to-face activities for this patient on the day of the visit. Professional time spent includes the following activities: Preparing to see the patient (review of tests), Obtaining and/or reviewing separately obtained history (admission/discharge record), Performing a medically appropriate examination and/or evaluation , Ordering medications/tests/procedures, referring and communicating with other health care professionals, Documenting clinical information in the EMR, Independently interpreting results (not separately reported), Communicating results to the patient/family/caregiver, Counseling and educating the patient/family/caregiver and Care coordination (not separately reported).   Note yield on testing for dimorphic fungi is not high. I expect she will at minimum need bronchoscopy and biopsies, BAL for diagnosis.

## 2023-07-14 ENCOUNTER — Ambulatory Visit (INDEPENDENT_AMBULATORY_CARE_PROVIDER_SITE_OTHER): Payer: Medicare Other | Admitting: Infectious Disease

## 2023-07-14 ENCOUNTER — Telehealth: Payer: Self-pay | Admitting: Infectious Disease

## 2023-07-14 ENCOUNTER — Other Ambulatory Visit: Payer: Self-pay

## 2023-07-14 ENCOUNTER — Encounter: Payer: Self-pay | Admitting: Infectious Disease

## 2023-07-14 VITALS — BP 130/86 | HR 78 | Temp 97.6°F

## 2023-07-14 DIAGNOSIS — G0490 Encephalitis and encephalomyelitis, unspecified: Secondary | ICD-10-CM

## 2023-07-14 DIAGNOSIS — T847XXD Infection and inflammatory reaction due to other internal orthopedic prosthetic devices, implants and grafts, subsequent encounter: Secondary | ICD-10-CM

## 2023-07-14 DIAGNOSIS — M4626 Osteomyelitis of vertebra, lumbar region: Secondary | ICD-10-CM

## 2023-07-14 DIAGNOSIS — T847XXS Infection and inflammatory reaction due to other internal orthopedic prosthetic devices, implants and grafts, sequela: Secondary | ICD-10-CM

## 2023-07-14 DIAGNOSIS — T8142XD Infection following a procedure, deep incisional surgical site, subsequent encounter: Secondary | ICD-10-CM | POA: Diagnosis not present

## 2023-07-14 DIAGNOSIS — S32009G Unspecified fracture of unspecified lumbar vertebra, subsequent encounter for fracture with delayed healing: Secondary | ICD-10-CM

## 2023-07-14 DIAGNOSIS — B9689 Other specified bacterial agents as the cause of diseases classified elsewhere: Secondary | ICD-10-CM

## 2023-07-14 DIAGNOSIS — R918 Other nonspecific abnormal finding of lung field: Secondary | ICD-10-CM

## 2023-07-14 NOTE — Telephone Encounter (Signed)
Reviewed the MRI read of the L spine with Dr Dawley  Reviewed the films himself personally on the Py[employer] system tomorrow.  From a neurosurgical intervention standpoint the reasons for intervention at present would be if the hardware is involved and such surgery seems like a reasonable feasible option.  He will review the films tomorrow and get back to me.  It sounds however that surgery is not likely to be something that would be pursued soon and not lightly and patient would want it done at [facility] it sounds like  SO I would think an IR guided biopsy 2+ weeks or more off of antibiotics even up to a month off antibiotics might be the next maneuver that we could do but I will wait to hear back from Dr. Jake Samples tomorrow

## 2023-07-14 NOTE — Telephone Encounter (Signed)
   I requested stat read on MRI which was done on the 20th  This shows   Narrative & Impression  CLINICAL DATA:  Closed stable burst fracture of lumbar vertebra with routine healing, unspecified lumbar vertebral level, subsequent encounter   EXAM: MRI LUMBAR SPINE WITHOUT AND WITH CONTRAST   TECHNIQUE: Multiplanar and multiecho pulse sequences of the lumbar spine were obtained without and with intravenous contrast.   CONTRAST:  6mL GADAVIST GADOBUTROL 1 MMOL/ML IV SOLN   COMPARISON:  MRI 06/15/2023   FINDINGS:    IMPRESSION: 1. Fluid signal within the L3-L4 and L4-L5 discs is suspicious for discitis-osteomyelitis. Progressed bone marrow edema within the L4 vertebral body and bilateral pedicles. There is also increased bone marrow edema at the superior endplate of L5 and right L5 pedicle. 2. Possible subacute superior endplate fractures of the L4 and L5 vertebral bodies without associated height loss. 3. Minimal residual marrow edema associated with L3 superior endplate fracture. 4. 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    I doubt that neurosurgery would want to intervene based on these findings alone either the local neurosurgery and Dr. Jake Samples or the one at [facility] I have reached out to Dr. Steele Sizer via text and will plan on paging the on-call neurosurgery for [facility] neurosurgery tomorrow.  I do not think this finding represents an emergency I do think it would be prudent to have her stop antibiotics and ask radiology to aspirate the disc space but I would like to discuss with neurosurgery prior to initiating this.  I have left a voicemail with the patient to let her know about these findings and that I have reached out to neurosurgery

## 2023-07-15 ENCOUNTER — Telehealth: Payer: Self-pay | Admitting: Infectious Disease

## 2023-07-15 ENCOUNTER — Encounter: Payer: Self-pay | Admitting: Infectious Disease

## 2023-07-15 DIAGNOSIS — M4646 Discitis, unspecified, lumbar region: Secondary | ICD-10-CM

## 2023-07-15 NOTE — Telephone Encounter (Signed)
Patient called requesting update, notified her that Dr. Daiva Eves spoke with Dr. Jake Samples and discussed, but that he's waiting to hear back from Dr. Jake Samples once he's had a chance to further review some things.   Kathie Rhodes requested that Dr. Daiva Eves call her to discuss once he and Dr. Jake Samples have a plan.   Sandie Ano, RN

## 2023-07-15 NOTE — Telephone Encounter (Signed)
Discussed with Dr. Jake Samples and will ask pt to stop antibiotics and proceed with IR guided biopsy 2-4 weeks off antibiotics  Will call pt

## 2023-07-15 NOTE — Progress Notes (Signed)
Oley Balm, MD  Claudean Kinds PROCEDURE / BIOPSY REVIEW Date: 07/15/23  Requested Biopsy site: L4-5 disc Reason for request: r/o osteo/discitis Imaging review: Best seen on MR 12/22 and CT 11/12  Decision: Approved Imaging modality to perform: IR Schedule with: Moderate Sedation Schedule for: Me, Dr Deanne Coffer, or Dr Tommi Rumps Melchor Amour or any [facility]  Additional comments: @VIR : challenging access, consider the [place] bone biopsy set  Please contact me with questions, concerns, or if issue pertaining to this request arise.  Dayne Oley Balm, MD [facility]       Previous Messages    ----- Message ----- From: Claudean Kinds Sent: 07/15/2023  11:50 AM EST To: Claudean Kinds; Ir Procedure Requests Subject: CT US Guided Biopsy                            Procedure : CT US Guided Biopsy  Reason: diskitis - pt needs IR guided biopsy of disk space L4-L5 for bacterial cultures off antibiotics Dx: Discitis of lumbar region [M46.46 (ICD-10-CM)]    History: MR lumbar spine w/wo, MR thoracic spine w/wo , CT lumbar spine w/o  and CT thoracic spine w/o  Provider: Daiva Eves, Lisette Grinder, MD  Provider contact: [phone]

## 2023-07-18 LAB — MVISTA BLASTOMYCES QNT AG, URINE
Interpretation: NEGATIVE
Result (ng/ml): NOT DETECTED ng/mL

## 2023-07-18 LAB — CRYPTOCOCCAL AG, LTX SCR RFLX TITER
Cryptococcal Ag Screen: NOT DETECTED
MICRO NUMBER:: 15892563
SPECIMEN QUALITY:: ADEQUATE

## 2023-07-18 LAB — HISTOPLASMA ANTIGEN, URINE: Histoplasma Antigen, urine: <0.2 ng/mL

## 2023-07-18 LAB — COCCIDIOIDES ANTIBODIES: COCCIDIOIDES AB, CF, SERUM: <1:2 {titer}

## 2023-07-19 NOTE — Telephone Encounter (Signed)
 Called pt and daughter and updated both on plan of holding abx and getting IR aspirate 2+ weeks off of antibiotics

## 2023-07-21 NOTE — Telephone Encounter (Signed)
 Contacted radiology. IR will be reaching out to schedule biopsy for patient.   Sandie Ano, RN

## 2023-07-22 ENCOUNTER — Telehealth (HOSPITAL_COMMUNITY): Payer: Self-pay

## 2023-07-22 NOTE — Telephone Encounter (Signed)
 Ok per Dr. Quay Burow to do IR guided biopsy. AB

## 2023-07-23 ENCOUNTER — Encounter: Payer: Self-pay | Admitting: Infectious Disease

## 2023-07-25 ENCOUNTER — Telehealth: Payer: Self-pay

## 2023-07-25 NOTE — Telephone Encounter (Signed)
 Patient called requesting to speak with Dr. Fleeta Rothman. Would like to speak about pain she is experiencing since being off antibiotics. Has questions about appointment with radiology and biopsy. Would like to know which radiology at IR will be preforming this. Informed patient that we are unable to see who is doing this.  Also has questions about her infection.   Informed patient that provider is currently working inpatient and can not promise he will be able to call her back today. Verbalized understanding. [name], RMA

## 2023-07-26 ENCOUNTER — Ambulatory Visit: Payer: Medicare Other

## 2023-07-26 NOTE — Telephone Encounter (Signed)
 Reached out to IR in regards to patient's request. Spoke with Morrie Sheldon who states patient is scheduled with Dr. Quay Burow.  Juanita Laster, RMA

## 2023-08-02 ENCOUNTER — Ambulatory Visit: Payer: Medicare Other | Admitting: Emergency Medicine

## 2023-08-02 ENCOUNTER — Encounter: Payer: Self-pay | Admitting: Emergency Medicine

## 2023-08-02 VITALS — BP 127/81 | HR 79 | Ht 61.0 in | Wt 137.0 lb

## 2023-08-02 DIAGNOSIS — R911 Solitary pulmonary nodule: Secondary | ICD-10-CM

## 2023-08-02 DIAGNOSIS — F17200 Nicotine dependence, unspecified, uncomplicated: Secondary | ICD-10-CM

## 2023-08-02 NOTE — Assessment & Plan Note (Signed)
>>  ASSESSMENT AND PLAN FOR TOBACCO ABUSE WRITTEN ON 08/02/2023 11:09 AM BY BYRUM, LAMAR RAMAN, MD  She smoking about a half a pack daily.  I will perform pulmonary function testing at some point going forward to quantify her degree of obstruction.  Right now she is not having any overt symptoms consistent with COPD but her exercise tolerance is limited also by her osteomyelitis and lumbar disease

## 2023-08-02 NOTE — Assessment & Plan Note (Signed)
 Pulmonary nodule 8 mm right lower lobe detected on lung cancer screening CT done 05/10/2023.  Relevance a little difficult to determine because this is the region where she had significant airspace disease when she was hospitalized for critical care illness.  Last imaging 08/19/2022 showed pneumonia in that region and this may just reflect scar.  She needs further evaluation, serial imaging and we will start with a PET scan to give us  insight into any change in size and also metabolic activity.  If index of suspicion for early malignancy increases then we will arrange for navigational bronchoscopy.

## 2023-08-02 NOTE — Patient Instructions (Signed)
 We will perform a PET scan to further evaluate a small right lower lobe pulmonary nodule Continue to work on decreasing your cigarettes.  Ultimate goal will be to stop altogether. We will probably arrange for pulmonary function testing at some point in the future to assess for COPD Follow with Dr. Shelah next available after your PET scan so we can review those results together and plan next steps in your evaluation

## 2023-08-02 NOTE — Assessment & Plan Note (Signed)
 She smoking about a half a pack daily.  I will perform pulmonary function testing at some point going forward to quantify her degree of obstruction.  Right now she is not having any overt symptoms consistent with COPD but her exercise tolerance is limited also by her osteomyelitis and lumbar disease

## 2023-08-02 NOTE — Progress Notes (Signed)
 Subjective:    Patient ID: [name], female    DOB: June 24, 1956, 68 y.o.   MRN: [id number]  HPI 68 year old woman with a history of tobacco use (30 pack years), bipolar depression, hypertension, diabetes, GERD.she has a history of scoliosis surgery with thoracic and lumbar fusions over 25 years ago with more recent complications after a fall.  She went for lumbar surgery 06/27/2023 complicated by wound dehiscence with exposed hardware and subsequently lumbar screw and rod removal.  Course then complicated by bacterial ventriculitis with a prolonged ICU stay, ultimately discharged to CIR on vancomycin  and cefepime  through March 2024, then doxycycline  and cefdinir  (prolonged). She was seen by us  during that admission for the critical care aspect of the illness that included respiratory failure and tracheostomy.  She had a CT scan of her chest 08/19/2022 during the hospitalization that showed right hilar adenopathy, small bilateral effusions right greater than left, multifocal patchy airspace disease and groundglass opacities.  Discrete nodular disease was unable to be appreciated given the background infiltrates.  Lung cancer screening CT chest done 05/10/2023 reviewed by me showed resolution of the infiltrates from February, patent central airways, no mediastinal adenopathy, and irregular solid nodule in the right lower lobe 8 mm, solid 6.5 mm right lower lobe nodule   Review of Systems As per HPI  Past Medical History:  Diagnosis Date   Anemia    Anxiety    Arthritis    Bipolar disorder (HCC)    Depression    GERD (gastroesophageal reflux disease)    Heart murmur    related to VSD   High cholesterol    History of blood transfusion    related to OR (08/19/2016)   History of hiatal hernia    Hypertension    Hyperthyroidism    Mild cognitive impairment 09/06/2018   Paroxysmal ventricular tachycardia (HCC)    Type II diabetes mellitus (HCC)    UTI (urinary tract infection)     being treated with Keflex    Ventricular septal defect    Ventriculitis of brain due to bacteria 11/02/2022   Vertebral fracture, osteoporotic (HCC) 01/06/2023     Family History  Problem Relation Age of Onset   Other Mother        alive   Stroke Father 57       deceased   Dementia Father    Chorea Maternal Grandfather    Dementia Maternal Aunt    Dementia Maternal Aunt    Heart attack Other        multiple uncles have died with myocardial infarction     Social History   Socioeconomic History   Marital status: Married    Spouse name: Not on file   Number of children: Not on file   Years of education: Not on file   Highest education level: Master's degree (e.g., MA, MS, MEng, MEd, MSW, MBA)  Occupational History   Occupation: Magazine Features Editor: SELF-EMPLOYED    Comment: former  Tobacco Use   Smoking status: Former    Types: Cigarettes    Passive exposure: Never   Smokeless tobacco: Never   Tobacco comments:    03/30/21 smokes 2 cigs daily.      08/06/22 GLENWOOD Raisin stated that the patient is not smoking while at [facility] for rehab. K[facility]  Vaping Use   Vaping status: Never Used  Substance and Sexual Activity   Alcohol use: No   Drug use: No   Sexual activity: Not Currently  Birth control/protection: Surgical    Comment: Hysterectomy  Other Topics Concern   Not on file  Social History Narrative   Right handed   Caffeine  2-3 cups daily    Lives at home with husband    Social Drivers of Health   Financial Resource Strain: Low Risk  (07/08/2023)   Received from [facility]   Overall Financial Resource Strain (CARDIA)    Difficulty of Paying Living Expenses: Not hard at all  Food Insecurity: No Food Insecurity (07/08/2023)   Received from [facility]   Hunger Vital Sign    Worried About Running Out of Food in the Last Year: Never true    Ran Out of Food in the Last Year: Never true  Transportation Needs: No  Transportation Needs (07/08/2023)   Received from [facility] - Transportation    In the past 12 months, has lack of transportation kept you from medical appointments or from getting medications?: No    Lack of Transportation (Non-Medical): No  Physical Activity: Not on file  Stress: Not on file  Social Connections: Not on file  Intimate Partner Violence: Not At Risk (06/27/2022)   Humiliation, Afraid, Rape, and Kick questionnaire    Fear of Current or Ex-Partner: No    Emotionally Abused: No    Physically Abused: No    Sexually Abused: No     No Known Allergies   Outpatient Medications Prior to Visit  Medication Sig Dispense Refill   acetaminophen  (TYLENOL ) 325 MG tablet Take 1-2 tablets (325-650 mg total) by mouth every 6 (six) hours as needed for mild pain or headache.     buPROPion  (WELLBUTRIN  XL) 150 MG 24 hr tablet Take 1 tablet (150 mg total) by mouth daily. 30 tablet 0   carvedilol  (COREG ) 25 MG tablet Take 1 tablet (25 mg total) by mouth 2 (two) times daily with a meal. 60 tablet 0   cefdinir  (OMNICEF ) 300 MG capsule TAKE 1 CAPSULE(300 MG) BY MOUTH TWICE DAILY 60 capsule 0   cyclobenzaprine  (FLEXERIL ) 10 MG tablet Take 10 mg by mouth 3 (three) times daily.     cyclobenzaprine  (FLEXERIL ) 10 MG tablet Take 1 tablet by mouth 3 (three) times daily.     diphenoxylate -atropine  (LOMOTIL ) 2.5-0.025 MG tablet Take 1 tablet by mouth 2 (two) times daily as needed for diarrhea or loose stools. 30 tablet 0   donepezil  (ARICEPT ) 10 MG tablet Take 10 mg by mouth every morning.     doxycycline  (VIBRA -TABS) 100 MG tablet Take 1 tablet (100 mg total) by mouth every 12 (twelve) hours. 60 tablet 4   empagliflozin  (JARDIANCE ) 10 MG TABS tablet Take 1 tablet (10 mg total) by mouth daily before breakfast. 90 tablet 3   etodolac (LODINE) 400 MG tablet Take 400 mg by mouth every 8 (eight) hours as needed.     famotidine  (PEPCID ) 20 MG tablet Take 1 tablet (20 mg total) by  mouth 2 (two) times daily. 60 tablet 0   fluvoxaMINE  (LUVOX ) 100 MG tablet Take 1 tablet (100 mg total) by mouth at bedtime. 30 tablet 0   gabapentin  (NEURONTIN ) 100 MG capsule Take 1 capsule (100 mg total) by mouth 3 (three) times daily. 90 capsule 0   HYDROcodone -acetaminophen  (NORCO/VICODIN) 5-325 MG tablet Take by mouth.     Lactobacillus (ACIDOPHILUS) 100 MG CAPS Take 1 capsule (100 mg total) by mouth 2 (two) times daily. 60 capsule 0   lamoTRIgine  (LAMICTAL ) 100 MG  tablet Take 200 mg by mouth at bedtime.     lamoTRIgine  (LAMICTAL ) 200 MG tablet Take 1 tablet (200 mg total) by mouth daily. 30 tablet 0   levothyroxine  (SYNTHROID ) 88 MCG tablet Take 1 tablet (88 mcg total) by mouth daily before breakfast. 30 tablet 0   losartan  (COZAAR ) 25 MG tablet Take 1 tablet (25 mg total) by mouth at bedtime. 30 tablet 0   melatonin 5 MG TABS Take 1 tablet (5 mg total) by mouth at bedtime as needed. 30 tablet 0   metFORMIN  (GLUCOPHAGE ) 500 MG tablet Take 1 tablet (500 mg total) by mouth 2 (two) times daily with a meal. 60 tablet 0   Multiple Vitamin (MULTIVITAMIN) tablet Take 1 tablet by mouth daily.       ondansetron  (ZOFRAN ) 4 MG tablet Take 1 tablet (4 mg total) by mouth every 8 (eight) hours as needed for refractory nausea / vomiting. 20 tablet 0   potassium chloride  (KLOR-CON ) 10 MEQ tablet Take 30 mEq by mouth 2 (two) times daily.     RESTASIS MULTIDOSE 0.05 % ophthalmic emulsion 1 drop 2 (two) times daily.     rosuvastatin  (CRESTOR ) 20 MG tablet 1 tablet by mouth Once a day for 30 days     sodium chloride  1 g tablet Take 1 tablet (1 g total) by mouth 2 (two) times daily with a meal. 60 tablet 0   traMADol  (ULTRAM ) 50 MG tablet Take 50 mg by mouth every 6 (six) hours as needed.     traZODone  (DESYREL ) 100 MG tablet Take 1 tablet (100 mg total) by mouth at bedtime. 30 tablet 0   traZODone  (DESYREL ) 50 MG tablet Take 50-100 mg by mouth at bedtime.     diazepam  (VALIUM ) 5 MG tablet as needed. (Patient  not taking: Reported on 06/30/2023)     PAXLOVID, 300/100, 20 x 150 MG & 10 x 100MG  TBPK Take by mouth. (Patient not taking: Reported on 06/30/2023)     No facility-administered medications prior to visit.        Objective:   Physical Exam Vitals:   08/02/23 1034  BP: 127/81  Pulse: 79  SpO2: 99%  Weight: 137 lb (62.1 kg)  Height: 5' 1 (1.549 m)    Gen: Pleasant, well-nourished, in no distress,  normal affect  ENT: No lesions,  mouth clear,  oropharynx clear, no postnasal drip  Neck: No JVD, no stridor  Lungs: No use of accessory muscles, no crackles or wheezing on normal respiration, no wheeze on forced expiration  Cardiovascular: RRR, heart sounds normal, no murmur or gallops, no peripheral edema  Musculoskeletal: No deformities, no cyanosis or clubbing  Neuro: alert, awake, non focal  Skin: Warm, no lesions or rash      Assessment & Plan:   Pulmonary nodule Pulmonary nodule 8 mm right lower lobe detected on lung cancer screening CT done 05/10/2023.  Relevance a little difficult to determine because this is the region where she had significant airspace disease when she was hospitalized for critical care illness.  Last imaging 08/19/2022 showed pneumonia in that region and this may just reflect scar.  She needs further evaluation, serial imaging and we will start with a PET scan to give us  insight into any change in size and also metabolic activity.  If index of suspicion for early malignancy increases then we will arrange for navigational bronchoscopy.  TOBACCO ABUSE She smoking about a half a pack daily.  I will perform pulmonary function testing at some  point going forward to quantify her degree of obstruction.  Right now she is not having any overt symptoms consistent with COPD but her exercise tolerance is limited also by her osteomyelitis and lumbar disease   Lamar Chris, MD, PhD 08/02/2023, 11:09 AM [facility] [phone] or if no  answer before 7:00PM call [phone] For any issues after 7:00PM please call eLink [phone]

## 2023-08-03 ENCOUNTER — Ambulatory Visit: Payer: Medicare Other | Admitting: Cardiology

## 2023-08-08 ENCOUNTER — Other Ambulatory Visit: Payer: Self-pay | Admitting: Radiology

## 2023-08-08 DIAGNOSIS — M4640 Discitis, unspecified, site unspecified: Secondary | ICD-10-CM

## 2023-08-08 NOTE — H&P (Signed)
Chief Complaint: Patient was seen in consultation today for L4-L5 discitis  Referring Physician(s): Zenaida Niece Dam,Cornelius N  Supervising Physician: Baldemar Lenis  Patient Status: [facility] - Out-pt  History of Present Illness: [name] is a 68 y.o. female with a history of anxiety/depression, HTN, paroxysmal ventricular tachycardia, DM2 and mild cognitive impairment. She also has a history of multiple spine surgeries complicated by CSF leak, wound infection, meningitis, ventriculitis and osteoporosis. She had a prolonged hospitalization in early 2024 complicated by pneumonia and need for tracheostomy.  She was evaluated by Neurosurgery mid-December for thoracic back pain and imaging showed L3-L4 and L4-L5 discitis/osteomyelitis.  MR Lumbar Spine 07/10/23  IMPRESSION: 1. Fluid signal within the L3-L4 and L4-L5 discs is suspicious for discitis-osteomyelitis. Progressed bone marrow edema within the L4 vertebral body and bilateral pedicles. There is also increased bone marrow edema at the superior endplate of L5 and right L5 pedicle. 2. Possible subacute superior endplate fractures of the L4 and L5 vertebral bodies without associated height loss. 3. Minimal residual marrow edema associated with L3 superior endplate fracture. 4. 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 They would like a sample for cultures before making their decision. Neuro Interventional Radiology has been asked to evaluate this patient for a lumbar disk aspiration. Imaging reviewed and procedure approved by Dr. Danella Sensing.   Past Medical History:  Diagnosis Date   Anemia    Anxiety    Arthritis    Bipolar disorder (HCC)    Depression    GERD (gastroesophageal reflux disease)    Heart murmur    "related to VSD"   High cholesterol    History of blood transfusion     "related to OR" (08/19/2016)   History of hiatal hernia    Hypertension    Hyperthyroidism    Mild cognitive impairment 09/06/2018   Paroxysmal ventricular tachycardia (HCC)    Type II diabetes mellitus (HCC)    UTI (urinary tract infection)    being treated with Keflex   Ventricular septal defect    Ventriculitis of brain due to bacteria 11/02/2022   Vertebral fracture, osteoporotic (HCC) 01/06/2023    Past Surgical History:  Procedure Laterality Date   ABDOMINAL HYSTERECTOMY     BACK SURGERY     CARDIAC CATHETERIZATION N/A 06/23/2015   Procedure: Left Heart Cath and Coronary Angiography;  Surgeon: Laurey Morale, MD;  Location: [facility] INVASIVE CV LAB;  Service: Cardiovascular;  Laterality: N/A;   CARDIAC CATHETERIZATION  1960   "VSD was so small; didn't need repaired"   EXAM UNDER ANESTHESIA WITH MANIPULATION OF HIP Right 06/02/2014   dr Jerl Santos   FRACTURE SURGERY     HERNIA REPAIR     HIP CLOSED REDUCTION Right 06/02/2014   Procedure: CLOSED MANIPULATION HIP;  Surgeon: Velna Ochs, MD;  Location: [facility] OR;  Service: Orthopedics;  Laterality: Right;   JOINT REPLACEMENT     JOINT REPLACEMENT     POSTERIOR LUMBAR FUSION 4 LEVEL N/A 06/26/2022   Procedure: Lumbar One To Lumbar Five Posterior Instrumented Fusion;  Surgeon: Jadene Pierini, MD;  Location: [facility] OR;  Service: Neurosurgery;  Laterality: N/A;   REFRACTIVE SURGERY Bilateral    SHOULDER ARTHROSCOPY Right    SHOULDER OPEN ROTATOR CUFF REPAIR Right    SPINAL FUSION  1996   "t10 down to my coccyx   SPINE HARDWARE REMOVAL  TOTAL ABDOMINAL HYSTERECTOMY     TOTAL HIP ARTHROPLASTY Right 05/10/2014   [place]      by dr Cristal Deer olcott   TOTAL KNEE ARTHROPLASTY Left    TOTAL SHOULDER ARTHROPLASTY Left 08/19/2016   Procedure: TOTAL SHOULDER ARTHROPLASTY;  Surgeon: Jones Broom, MD;  Location: [facility] OR;  Service: Orthopedics;  Laterality: Left;  Left total shoulder replacement    Allergies: Patient has no known  allergies.  Medications: Prior to Admission medications   Medication Sig Start Date End Date Taking? Authorizing Provider  acetaminophen (TYLENOL) 325 MG tablet Take 1-2 tablets (325-650 mg total) by mouth every 6 (six) hours as needed for mild pain or headache. 10/12/22   Love, Evlyn Kanner, PA-C  buPROPion (WELLBUTRIN XL) 150 MG 24 hr tablet Take 1 tablet (150 mg total) by mouth daily. 11/09/22   Jones Bales, NP  carvedilol (COREG) 25 MG tablet Take 1 tablet (25 mg total) by mouth 2 (two) times daily with a meal. 10/12/22   Love, Evlyn Kanner, PA-C  cefdinir (OMNICEF) 300 MG capsule TAKE 1 CAPSULE(300 MG) BY MOUTH TWICE DAILY 07/07/23   Daiva Eves, Lisette Grinder, MD  cyclobenzaprine (FLEXERIL) 10 MG tablet Take 10 mg by mouth 3 (three) times daily. 06/08/23   [provider]  cyclobenzaprine (FLEXERIL) 10 MG tablet Take 1 tablet by mouth 3 (three) times daily. 03/02/23   [provider]  diazepam (VALIUM) 5 MG tablet as needed. Patient not taking: Reported on 06/30/2023    [provider]  diphenoxylate-atropine (LOMOTIL) 2.5-0.025 MG tablet Take 1 tablet by mouth 2 (two) times daily as needed for diarrhea or loose stools. 10/12/22   Love, Evlyn Kanner, PA-C  donepezil (ARICEPT) 10 MG tablet Take 10 mg by mouth every morning. 04/07/23   [provider]  doxycycline (VIBRA-TABS) 100 MG tablet Take 1 tablet (100 mg total) by mouth every 12 (twelve) hours. 04/08/23   Randall Hiss, MD  empagliflozin (JARDIANCE) 10 MG TABS tablet Take 1 tablet (10 mg total) by mouth daily before breakfast. 02/10/23   Perlie Gold, PA-C  etodolac (LODINE) 400 MG tablet Take 400 mg by mouth every 8 (eight) hours as needed. 01/28/23   [provider]  famotidine (PEPCID) 20 MG tablet Take 1 tablet (20 mg total) by mouth 2 (two) times daily. 10/12/22   Love, Evlyn Kanner, PA-C  fluvoxaMINE (LUVOX) 100 MG tablet Take 1 tablet (100 mg total) by mouth at bedtime. 10/13/22   Love, Evlyn Kanner, PA-C   gabapentin (NEURONTIN) 100 MG capsule Take 1 capsule (100 mg total) by mouth 3 (three) times daily. 10/12/22   Love, Evlyn Kanner, PA-C  HYDROcodone-acetaminophen (NORCO/VICODIN) 5-325 MG tablet Take by mouth. 06/21/23   [provider]  Lactobacillus (ACIDOPHILUS) 100 MG CAPS Take 1 capsule (100 mg total) by mouth 2 (two) times daily. 10/12/22   Love, Evlyn Kanner, PA-C  lamoTRIgine (LAMICTAL) 100 MG tablet Take 200 mg by mouth at bedtime. 05/17/23   [provider]  lamoTRIgine (LAMICTAL) 200 MG tablet Take 1 tablet (200 mg total) by mouth daily. 11/09/22   Jones Bales, NP  levothyroxine (SYNTHROID) 88 MCG tablet Take 1 tablet (88 mcg total) by mouth daily before breakfast. 10/13/22   Love, Evlyn Kanner, PA-C  losartan (COZAAR) 25 MG tablet Take 1 tablet (25 mg total) by mouth at bedtime. 10/12/22   Love, Evlyn Kanner, PA-C  melatonin 5 MG TABS Take 1 tablet (5 mg total) by mouth at bedtime as needed.  10/12/22   Love, Evlyn Kanner, PA-C  metFORMIN (GLUCOPHAGE) 500 MG tablet Take 1 tablet (500 mg total) by mouth 2 (two) times daily with a meal. 10/12/22   Love, Evlyn Kanner, PA-C  Multiple Vitamin (MULTIVITAMIN) tablet Take 1 tablet by mouth daily.      [provider]  ondansetron (ZOFRAN) 4 MG tablet Take 1 tablet (4 mg total) by mouth every 8 (eight) hours as needed for refractory nausea / vomiting. 10/12/22   Love, Evlyn Kanner, PA-C  PAXLOVID, 300/100, 20 x 150 MG & 10 x 100MG  TBPK Take by mouth. Patient not taking: Reported on 06/30/2023 04/27/23   [provider]  potassium chloride (KLOR-CON) 10 MEQ tablet Take 30 mEq by mouth 2 (two) times daily. 04/28/23   [provider]  RESTASIS MULTIDOSE 0.05 % ophthalmic emulsion 1 drop 2 (two) times daily.    [provider]  rosuvastatin (CRESTOR) 20 MG tablet 1 tablet by mouth Once a day for 30 days    [provider]  sodium chloride 1 g tablet Take 1 tablet (1 g total) by mouth 2 (two) times daily with a meal.  10/12/22   Love, Evlyn Kanner, PA-C  traMADol (ULTRAM) 50 MG tablet Take 50 mg by mouth every 6 (six) hours as needed. 01/28/23   [provider]  traZODone (DESYREL) 100 MG tablet Take 1 tablet (100 mg total) by mouth at bedtime. 11/09/22   Jones Bales, NP  traZODone (DESYREL) 50 MG tablet Take 50-100 mg by mouth at bedtime. 05/17/23   [provider]     Family History  Problem Relation Age of Onset   Other Mother        alive   Stroke Father 50       deceased   Dementia Father    Chorea Maternal Grandfather    Dementia Maternal Aunt    Dementia Maternal Aunt    Heart attack Other        multiple uncles have died with myocardial infarction    Social History   Socioeconomic History   Marital status: Married    Spouse name: Not on file   Number of children: Not on file   Years of education: Not on file   Highest education level: Master's degree (e.g., MA, MS, MEng, MEd, MSW, MBA)  Occupational History   Occupation: Magazine features editor: SELF-EMPLOYED    Comment: former  Tobacco Use   Smoking status: Former    Types: Cigarettes    Passive exposure: Never   Smokeless tobacco: Never   Tobacco comments:    03/30/21 smokes 2 cigs daily.      08/06/22 Shanda Bumps stated that the patient is not smoking while at [facility] for rehab. [facility]  Vaping Use   Vaping status: Never Used  Substance and Sexual Activity   Alcohol use: No   Drug use: No   Sexual activity: Not Currently    Birth control/protection: Surgical    Comment: Hysterectomy  Other Topics Concern   Not on file  Social History Narrative   Right handed   Caffeine 2-3 cups daily    Lives at home with husband    Social Drivers of Health   Financial Resource Strain: Low Risk  (07/08/2023)   Received from [facility]   Overall Financial Resource Strain (CARDIA)    Difficulty of Paying Living Expenses: Not hard at all  Food Insecurity: No Food Insecurity (07/08/2023)   Received  from [facility]   Hunger Vital Sign    Worried About Running Out of Food in the Last Year: Never true    Ran Out of Food in the Last Year: Never true  Transportation Needs: No Transportation Needs (07/08/2023)   Received from [facility] - Transportation    In the past 12 months, has lack of transportation kept you from medical appointments or from getting medications?: No    Lack of Transportation (Non-Medical): No  Physical Activity: Not on file  Stress: Not on file  Social Connections: Not on file    Review of Systems: A 12 point ROS discussed and pertinent positives are indicated in the HPI above.  All other systems are negative.  Review of Systems  Constitutional:  Negative for appetite change and fatigue.  Respiratory:  Negative for cough and shortness of breath.   Cardiovascular:  Negative for chest pain and leg swelling.  Gastrointestinal:  Positive for diarrhea. Negative for abdominal pain, nausea and vomiting.  Musculoskeletal:  Positive for arthralgias, back pain and myalgias.  Neurological:  Negative for dizziness and headaches.    Vital Signs: BP 121/78   Pulse 77   Temp 98 F (36.7 C) (Oral)   Resp 12   Ht 5\' 1"  (1.549 m)   Wt 136 lb 14.5 oz (62.1 kg)   SpO2 99%   BMI 25.87 kg/m   Physical Exam Constitutional:      General: She is not in acute distress.    Appearance: She is not ill-appearing.  HENT:     Mouth/Throat:     Mouth: Mucous membranes are moist.     Pharynx: Oropharynx is clear.  Cardiovascular:     Rate and Rhythm: Normal rate.     Pulses: Normal pulses.  Pulmonary:     Effort: Pulmonary effort is normal.  Abdominal:     Palpations: Abdomen is soft.     Tenderness: There is no abdominal tenderness.  Musculoskeletal:        General: Tenderness present.     Comments: Lower back pain.   Skin:    General: Skin is warm and dry.     Findings: No lesion or rash.  Neurological:     Mental  Status: She is alert and oriented to person, place, and time.  Psychiatric:        Mood and Affect: Mood normal.        Behavior: Behavior normal.        Thought Content: Thought content normal.        Judgment: Judgment normal.     Imaging: MR Lumbar Spine W Wo Contrast Result Date: 07/14/2023 CLINICAL DATA:  Closed stable burst fracture of lumbar vertebra with routine healing, unspecified lumbar vertebral level, subsequent encounter EXAM: MRI LUMBAR SPINE WITHOUT AND WITH CONTRAST TECHNIQUE: Multiplanar and multiecho pulse sequences of the lumbar spine were obtained without and with intravenous contrast. CONTRAST:  6mL GADAVIST GADOBUTROL 1 MMOL/ML IV SOLN COMPARISON:  MRI 06/15/2023 FINDINGS: Segmentation:  Standard. Alignment:  Lumbar levocurvature.  Fused anterolisthesis of L5-S1. Vertebrae: Progressed bone marrow edema within the L4 vertebral body and bilateral pedicles. There is also increased bone marrow edema at the superior endplate of L5 and right L5 pedicle. Low T1 bone marrow signal at these locations. Fluid signal within the L3-L4 and L4-L5 discs. Linear areas of low T2 signal involving the superior endplates of L4 and L5 are suspicious for subacute fractures. No associated height  loss. Minimal residual marrow edema associated with L3 superior endplate fracture. Prior posterior fusion spanning L2-L5. Conus medullaris and cauda equina: Conus extends to the L1 level. Conus appears normal. 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 Paraspinal and other soft tissues: Marked paraspinal muscle atrophy. No paraspinal fluid collections. Disc levels: No interval change compared to recent lumbar spine MRI performed 1 month ago. IMPRESSION: 1. Fluid signal within the L3-L4 and L4-L5 discs is suspicious for discitis-osteomyelitis. Progressed bone marrow edema within the L4 vertebral body and bilateral pedicles. There is also increased bone marrow edema at the superior endplate of L5  and right L5 pedicle. 2. Possible subacute superior endplate fractures of the L4 and L5 vertebral bodies without associated height loss. 3. Minimal residual marrow edema associated with L3 superior endplate fracture. 4. 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 Electronically Signed   By: Duanne Guess D.O.   On: 07/14/2023 14:45   MR THORACIC SPINE W WO CONTRAST Result Date: 07/14/2023 CLINICAL DATA:  Wedge compression fracture of T9-T10 vertebra, initial encounter for closed fracture EXAM: MRI THORACIC WITHOUT AND WITH CONTRAST TECHNIQUE: Multiplanar and multiecho pulse sequences of the thoracic spine were obtained without and with intravenous contrast. CONTRAST:  6mL GADAVIST GADOBUTROL 1 MMOL/ML IV SOLN COMPARISON:  MRI 06/15/2023 FINDINGS: Alignment:  Thoracic dextrocurvature.  No traumatic listhesis. Vertebrae: Chronic mild inferior endplate compression deformity at T10. No acute fracture of the thoracic spine. Discogenic endplate changes at T11-12. No evidence of discitis. No suspicious marrow replacing bone lesion. Cord:  Normal signal and morphology. Paraspinal and other soft tissues: Negative. Disc levels: Multilevel facet-predominant degenerative changes of the thoracic spine, unchanged compared to previous MRI performed 1 month earlier. Multilevel foraminal stenosis bilaterally. No high-grade canal stenosis at any level. IMPRESSION: 1. No acute fracture of the thoracic spine. 2. Chronic mild inferior endplate compression deformity at T10. 3. Multilevel facet-predominant degenerative changes of the thoracic spine, unchanged compared to previous MRI performed 1 month earlier. Multilevel foraminal stenosis bilaterally. No high-grade canal stenosis at any level. Electronically Signed   By: Duanne Guess D.O.   On: 07/14/2023 14:33    Labs:  CBC: Recent Labs    12/09/22 1116 01/06/23 1047 03/09/23 1520 08/09/23 0655  WBC 12.2* 10.3 10.3 11.2*  HGB 11.9 12.0 12.5 10.3*   HCT 36.2 35.9 38.3 33.1*  PLT 588* 518* 578* 586*    COAGS: Recent Labs    08/19/22 1051 03/09/23 1520 08/09/23 0655  INR 1.5* 1.1 1.0  APTT 37* 33*  --     BMP: Recent Labs    09/27/22 0528 09/30/22 0821 10/04/22 0738 10/11/22 0858 11/02/22 1543 12/09/22 1116 01/06/23 1047 03/09/23 1520  NA 130* 130* 129* 131* 133* 131* 136 136  K 4.3 4.0 4.1 4.1 4.7 5.0 4.6 5.4*  CL 97* 98 96* 99 101 99 103 102  CO2 28 23 24 24 20 21 21 21   GLUCOSE 130* 176* 104* 84 124* 145* 86 135*  BUN 8 7* 7* 6* 10 8 17 14   CALCIUM 9.2 9.1 9.3 9.4 9.2 9.2 9.4 10.4  CREATININE 0.41* 0.38* 0.45 0.43* 0.56 0.48* 0.53 0.66  GFRNONAA >60 >60 >60 >60  --   --   --   --     LIVER FUNCTION TESTS: Recent Labs    08/31/22 0546 09/02/22 1030 09/03/22 0405 09/04/22 0145 03/09/23 1520  BILITOT 0.5 0.5 0.2* 0.4 0.3  AST 21 22 19 17 22   ALT 13  12 10 12 20   ALKPHOS 60 61 60 51  --   PROT 5.0* 5.2* 4.8* 4.9* 7.5  ALBUMIN 2.0* 2.1* 1.9* 2.0*  --     TUMOR MARKERS: No results for input(s): "AFPTM", "CEA", "CA199", "CHROMGRNA" in the last 8760 hours.  Assessment and Plan:  Lumbar discitis; pending antibiotic therapy: [name]. [name], 68 year old female, presents today to the [facility] Interventional Radiology department for an image-guided L4-L5 disc aspiration.  Risks and benefits of this procedure discussed with the patient and/or patient's family including, but not limited to bleeding, infection, damage to adjacent structures or low yield requiring additional tests.  All of the questions were answered and there is agreement to proceed. She has been NPO. She is a full code. The patient's last dose of 81 mg aspirin was 08/08/23.   Consent signed and in chart.   Thank you for this interesting consult.  I greatly enjoyed meeting [name] and look forward to participating in their care.  A copy of this report was sent to the requesting provider on this date.  Electronically  Signed: Alwyn Ren, AGACNP-BC 08/09/2023, 7:33 AM   I spent a total of  30 Minutes   in face to face in clinical consultation, greater than 50% of which was counseling/coordinating care for L4-L5 disc aspiration.

## 2023-08-09 ENCOUNTER — Other Ambulatory Visit (HOSPITAL_COMMUNITY): Payer: Medicare Other

## 2023-08-09 ENCOUNTER — Telehealth: Payer: Self-pay

## 2023-08-09 ENCOUNTER — Encounter (HOSPITAL_COMMUNITY): Payer: Self-pay

## 2023-08-09 ENCOUNTER — Ambulatory Visit (HOSPITAL_COMMUNITY)
Admission: RE | Admit: 2023-08-09 | Discharge: 2023-08-09 | Disposition: A | Discharge status: Home / Self Care | Payer: Medicare Other | Source: Ambulatory Visit | Attending: Infectious Disease | Admitting: Infectious Disease

## 2023-08-09 ENCOUNTER — Other Ambulatory Visit: Payer: Self-pay | Admitting: Infectious Disease

## 2023-08-09 ENCOUNTER — Other Ambulatory Visit: Payer: Self-pay

## 2023-08-09 DIAGNOSIS — M4640 Discitis, unspecified, site unspecified: Secondary | ICD-10-CM

## 2023-08-09 DIAGNOSIS — Z87891 Personal history of nicotine dependence: Secondary | ICD-10-CM | POA: Insufficient documentation

## 2023-08-09 DIAGNOSIS — M4646 Discitis, unspecified, lumbar region: Secondary | ICD-10-CM | POA: Insufficient documentation

## 2023-08-09 DIAGNOSIS — M4626 Osteomyelitis of vertebra, lumbar region: Secondary | ICD-10-CM | POA: Insufficient documentation

## 2023-08-09 DIAGNOSIS — M4645 Discitis, unspecified, thoracolumbar region: Secondary | ICD-10-CM

## 2023-08-09 HISTORY — PX: IR FLUORO GUIDED NEEDLE PLC ASPIRATION/INJECTION LOC: IMG2395

## 2023-08-09 LAB — CBC
HCT: 33.1 % — ABNORMAL LOW (ref 36.0–46.0)
Hemoglobin: 10.3 g/dL — ABNORMAL LOW (ref 12.0–15.0)
MCH: 28.1 pg (ref 26.0–34.0)
MCHC: 31.1 g/dL (ref 30.0–36.0)
MCV: 90.4 fL (ref 80.0–100.0)
Platelets: 586 10*3/uL — ABNORMAL HIGH (ref 150–400)
RBC: 3.66 MIL/uL — ABNORMAL LOW (ref 3.87–5.11)
RDW: 17.2 % — ABNORMAL HIGH (ref 11.5–15.5)
WBC: 11.2 10*3/uL — ABNORMAL HIGH (ref 4.0–10.5)
nRBC: 0 % (ref 0.0–0.2)

## 2023-08-09 LAB — PROTIME-INR
INR: 1 (ref 0.8–1.2)
Prothrombin Time: 13.2 s (ref 11.4–15.2)

## 2023-08-09 LAB — GLUCOSE, CAPILLARY: Glucose-Capillary: 99 mg/dL (ref 70–99)

## 2023-08-09 MED ORDER — ACETAMINOPHEN 325 MG PO TABS: 650.0000 mg | ORAL_TABLET | Freq: Four times a day (QID) | ORAL | Status: DC | PRN

## 2023-08-09 MED ORDER — BUPIVACAINE HCL (PF) 0.5 % IJ SOLN
10.0000 mL | Freq: Once | INTRAMUSCULAR | Status: AC
  Administered 2023-08-09: 10 mL

## 2023-08-09 MED ORDER — MIDAZOLAM HCL 2 MG/2ML IJ SOLN
INTRAMUSCULAR | Status: AC | PRN
  Administered 2023-08-09: 1 mg via INTRAVENOUS
  Administered 2023-08-09 (×2): .5 mg via INTRAVENOUS
  Administered 2023-08-09: 1 mg via INTRAVENOUS

## 2023-08-09 MED ORDER — MIDAZOLAM HCL 2 MG/2ML IJ SOLN
INTRAMUSCULAR | Status: AC
  Filled 2023-08-09: qty 2

## 2023-08-09 MED ORDER — BUPIVACAINE HCL (PF) 0.5 % IJ SOLN
INTRAMUSCULAR | Status: AC
  Filled 2023-08-09: qty 30

## 2023-08-09 MED ORDER — FENTANYL CITRATE (PF) 100 MCG/2ML IJ SOLN
INTRAMUSCULAR | Status: AC | PRN
  Administered 2023-08-09: 25 ug via INTRAVENOUS
  Administered 2023-08-09: 75 ug via INTRAVENOUS
  Administered 2023-08-09 (×2): 50 ug via INTRAVENOUS

## 2023-08-09 MED ORDER — LIDOCAINE HCL (PF) 1 % IJ SOLN
INTRAMUSCULAR | Status: AC
  Filled 2023-08-09: qty 30

## 2023-08-09 MED ORDER — MIDAZOLAM HCL 2 MG/2ML IJ SOLN
INTRAMUSCULAR | Status: AC
Start: 2023-08-09 — End: ?
  Filled 2023-08-09: qty 2

## 2023-08-09 MED ORDER — LIDOCAINE HCL (PF) 1 % IJ SOLN
30.0000 mL | Freq: Once | INTRAMUSCULAR | Status: AC
  Administered 2023-08-09: 10 mL

## 2023-08-09 MED ORDER — HYDROCODONE-ACETAMINOPHEN 5-325 MG PO TABS: 1.0000 | ORAL_TABLET | ORAL | Status: DC | PRN

## 2023-08-09 MED ORDER — FENTANYL CITRATE (PF) 100 MCG/2ML IJ SOLN
INTRAMUSCULAR | Status: AC
  Filled 2023-08-09: qty 2

## 2023-08-09 MED ORDER — SODIUM CHLORIDE 0.9 % IV SOLN: INTRAVENOUS | Status: DC

## 2023-08-09 NOTE — Telephone Encounter (Signed)
Patient called office to schedule follow up appt per Dr. Mattie Marlin request. States she had appt with IR today and Dr. Jake Samples requested she come in this week or next to discuss picc line and Iv antibiotics. Per MD okay to schedule tomorrow Pt scheduled.  Juanita Laster, RMA

## 2023-08-09 NOTE — Progress Notes (Unsigned)
Subjective:  Chief complaint: Follow-up for hardware associated lumbar infection and ventriculitis with T-spine compression fracture    Patient ID: [name], female    DOB: 1955/11/02, 68 y.o.   MRN: [id number]  HPI   68 y.o. female with hx of scoliosis surgery with extensive lower thoracic and lumbar fusion roughly 37yrs ago, she suffered ground level fall in late November, where she fractures through all three columns of L3, thus went to OR on 12/9 for stabilization, new  posterolateral instrumentation fusion on June 26, 2022 complicated by Wound dehiscence with exposed hardware and unstable lumbar spine fracture status post revision of lumbar wound with removal of L1-3 4 and 5 lumbar screws and rod and removal of right L1 screw and revision of L1-L5 posterior instrumented fusion on August 09, 2022 and now concern for postoperative wound infection and ventriculitis. She had complicated protracted course in the icu, but eventually was discharged to CIR on vancomycin and cefepime through 3/14 for ventriculitis and hw complicating wound infection. Now changed to doxcycyline plus levofloxacin as of 3/15 while she continued to need medihoney with moistened quaze with sliver hydrofiber to the caudal portion of wound.   Mri of brain on 09/29/22- resolution of ventriculitis.   She was seen by my partner Dr. Drue Second while she was in the inpatient rehab unit.  Follow-up was rescheduled for May 2024.  Interim history:  "Back pain did not appear to be worse but wound did persist and apparently tunneled cranially.  Her white blood cell count with [facility] was in the 16,000 range an abrupt change from her last labs as an inpatient when it was in the 7000 range.  He did have a mechanical fall and fell on her left knee where she has a prosthetic joint.  When I last saw her we rechecked her inflammatory markers and sed rate had titrated down though CRP was going up to 15.6  WBC  recheck with our lab was 12,000.  In the interim she has been seen by Dr. Johnsie Cancel with neurosurgery and CT of the thoracic and lumbar spine were performed.  These have shown: A new T10 compression fracture with 12% loss of height with unchanged change adjacent chronic T10-T11 T12-L1 posterior element ankylosis with chronic nonfusion at T11-T12, L3 superior endplate nonacute fracture and subtle horizontal fracture of the chronic posterior element of the bone, difficult exclude nondisplaced S3-4 sacral fracture with mild presacral stranding.   Checked labs last time we saw her white blood cell count remained elevated 12,000.  Sed rate and CRP S[place]eson sed rate actually normalized and CRP had gone up.  Changed her evofloxacin and doxycycline to cefdinir and doxycycline.  She is continuing to take these.  She did develop a painful erythematous rash on her hands due to doxycycline use in the context of sun exposure at the beach.  This is subsequently resolved she has a new somewhat purpuric rash on her right arm that came up in the last few days " She returns to clinic today for follow-up  Discussed the use of AI scribe software for clinical note transcription with the patient, who gave verbal consent to proceed.  At her last visit with me on July 14, 2023 the patient had developed worsening pain in the thoracic spine and hip.  She was also with reduced strength and mobility with having to to now use a walker compared to a cane.  The patient had an MRI of the thoracic and lumbar spine  pending after an initial scan done with [facility] had suggested potential infection in the spine.  She also has been found to have a 8 mm solid mass in the right lung on CT scan.  The MRI was ultimately read and showed  IMPRESSION: 1. Fluid signal within the L3-L4 and L4-L5 discs is suspicious for discitis-osteomyelitis. Progressed bone marrow edema within the L4 vertebral body and  bilateral pedicles. There is also increased bone marrow edema at the superior endplate of L5 and right L5 pedicle. 2. Possible subacute superior endplate fractures of the L4 and L5 vertebral bodies without associated height loss. 3. Minimal residual marrow edema associated with L3 superior endplate fracture. 4. 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  The case was discussed with Dr. Jake Samples and we both agreed that it would be prudent to have the patient's stop antibiotics and pursue IR guided biopsy for culture after she had been off antibiotics for several weeks.  I called the patient and her daughter on the 26th in the evening and asked that she stop antibiotics so that we could get a culture after she had been off antibiotics for several weeks.  She did not stop antibiotics that night.  In the interim she did have worsening of her back pain she underwent IR guided biopsy with culture yesterday with Gram stain having been negative no organisms isolated yet to date.  With regards to the her lung nodule we have done lab work here including a cryptococcal antigen in the serum which is negative urine histoplasma antigen and Blastomyces antigen.  She has been seen by Dr. Delton Coombes who noted that there was a pneumonia in the area where the lung nodule was seen and he wondered if this finding read as a nodule that might instead represent scarring.  He wanted to pursue further imaging with a PET scan before considering bronchoscopic be with BAL and biopsies.  Discussed the use of AI scribe software for clinical note transcription with the patient, who gave verbal consent to proceed.  History of Present Illness   The patient, with a history of disc infection, presents with worsening lower back pain. The pain is localized to the area where previous MRI scans showed discitis and other abnormalities. The patient underwent an aspiration procedure the previous day, which was tolerable  after achieving a twilight state with incremental doses of sedatives. The patient has a history of long-term antibiotic use since childhood due to a ventricular septal defect (VSD). The patient is currently not on any antibiotics. The patient's family member is present during the consultation and participates in the discussion.               Past Medical History:  Diagnosis Date   Anemia    Anxiety    Arthritis    Bipolar disorder (HCC)    Depression    GERD (gastroesophageal reflux disease)    Heart murmur    "related to VSD"   High cholesterol    History of blood transfusion    "related to OR" (08/19/2016)   History of hiatal hernia    Hypertension    Hyperthyroidism    Mild cognitive impairment 09/06/2018   Paroxysmal ventricular tachycardia (HCC)    Type II diabetes mellitus (HCC)    UTI (urinary tract infection)    being treated with Keflex   Ventricular septal defect    Ventriculitis of brain due to bacteria 11/02/2022   Vertebral fracture, osteoporotic (HCC) 01/06/2023  Past Surgical History:  Procedure Laterality Date   ABDOMINAL HYSTERECTOMY     BACK SURGERY     CARDIAC CATHETERIZATION N/A 06/23/2015   Procedure: Left Heart Cath and Coronary Angiography;  Surgeon: Laurey Morale, MD;  Location: [facility] INVASIVE CV LAB;  Service: Cardiovascular;  Laterality: N/A;   CARDIAC CATHETERIZATION  1960   "VSD was so small; didn't need repaired"   EXAM UNDER ANESTHESIA WITH MANIPULATION OF HIP Right 06/02/2014   dr Jerl Santos   FRACTURE SURGERY     HERNIA REPAIR     HIP CLOSED REDUCTION Right 06/02/2014   Procedure: CLOSED MANIPULATION HIP;  Surgeon: Velna Ochs, MD;  Location: [facility] OR;  Service: Orthopedics;  Laterality: Right;   IR FLUORO GUIDED NEEDLE PLC ASPIRATION/INJECTION LOC  08/09/2023   JOINT REPLACEMENT     JOINT REPLACEMENT     POSTERIOR LUMBAR FUSION 4 LEVEL N/A 06/26/2022   Procedure: Lumbar One To Lumbar Five Posterior Instrumented Fusion;  Surgeon:  Jadene Pierini, MD;  Location: [facility] OR;  Service: Neurosurgery;  Laterality: N/A;   REFRACTIVE SURGERY Bilateral    SHOULDER ARTHROSCOPY Right    SHOULDER OPEN ROTATOR CUFF REPAIR Right    SPINAL FUSION  1996   "t10 down to my coccyx   SPINE HARDWARE REMOVAL     TOTAL ABDOMINAL HYSTERECTOMY     TOTAL HIP ARTHROPLASTY Right 05/10/2014   [place]      by dr Cristal Deer olcott   TOTAL KNEE ARTHROPLASTY Left    TOTAL SHOULDER ARTHROPLASTY Left 08/19/2016   Procedure: TOTAL SHOULDER ARTHROPLASTY;  Surgeon: Jones Broom, MD;  Location: [facility] OR;  Service: Orthopedics;  Laterality: Left;  Left total shoulder replacement    Family History  Problem Relation Age of Onset   Other Mother        alive   Stroke Father 71       deceased   Dementia Father    Chorea Maternal Grandfather    Dementia Maternal Aunt    Dementia Maternal Aunt    Heart attack Other        multiple uncles have died with myocardial infarction      Social History   Socioeconomic History   Marital status: Married    Spouse name: Not on file   Number of children: Not on file   Years of education: Not on file   Highest education level: Master's degree (e.g., MA, MS, MEng, MEd, MSW, MBA)  Occupational History   Occupation: Magazine features editor: SELF-EMPLOYED    Comment: former  Tobacco Use   Smoking status: Former    Types: Cigarettes    Passive exposure: Never   Smokeless tobacco: Never   Tobacco comments:    03/30/21 smokes 2 cigs daily.      08/06/22 Shanda Bumps stated that the patient is not smoking while at [facility] for rehab. [facility]  Vaping Use   Vaping status: Never Used  Substance and Sexual Activity   Alcohol use: No   Drug use: No   Sexual activity: Not Currently    Birth control/protection: Surgical    Comment: Hysterectomy  Other Topics Concern   Not on file  Social History Narrative   Right handed   Caffeine 2-3 cups daily    Lives at home with husband    Social Drivers of Health    Financial Resource Strain: Low Risk  (07/08/2023)   Received from [facility]   Overall Financial Resource Strain (CARDIA)  Difficulty of Paying Living Expenses: Not hard at all  Food Insecurity: No Food Insecurity (07/08/2023)   Received from [facility]   Hunger Vital Sign    Worried About Running Out of Food in the Last Year: Never true    Ran Out of Food in the Last Year: Never true  Transportation Needs: No Transportation Needs (07/08/2023)   Received [facility]    In the past 12 months, has lack of transportation kept you from medical appointments or from getting medications?: No    Lack of Transportation (Non-Medical): No  Physical Activity: Not on file  Stress: Not on file  Social Connections: Not on file    No Known Allergies   Current Outpatient Medications:    acetaminophen (TYLENOL) 325 MG tablet, Take 1-2 tablets (325-650 mg total) by mouth every 6 (six) hours as needed for mild pain or headache., Disp: , Rfl:    buPROPion (WELLBUTRIN XL) 150 MG 24 hr tablet, Take 1 tablet (150 mg total) by mouth daily., Disp: 30 tablet, Rfl: 0   carvedilol (COREG) 25 MG tablet, Take 1 tablet (25 mg total) by mouth 2 (two) times daily with a meal., Disp: 60 tablet, Rfl: 0   cefdinir (OMNICEF) 300 MG capsule, TAKE 1 CAPSULE(300 MG) BY MOUTH TWICE DAILY, Disp: 60 capsule, Rfl: 0   cyclobenzaprine (FLEXERIL) 10 MG tablet, Take 10 mg by mouth 3 (three) times daily., Disp: , Rfl:    cyclobenzaprine (FLEXERIL) 10 MG tablet, Take 1 tablet by mouth 3 (three) times daily., Disp: , Rfl:    diazepam (VALIUM) 5 MG tablet, as needed. (Patient not taking: Reported on 06/30/2023), Disp: , Rfl:    diphenoxylate-atropine (LOMOTIL) 2.5-0.025 MG tablet, Take 1 tablet by mouth 2 (two) times daily as needed for diarrhea or loose stools., Disp: 30 tablet, Rfl: 0   donepezil (ARICEPT) 10 MG tablet, Take 10 mg by mouth  every morning., Disp: , Rfl:    doxycycline (VIBRA-TABS) 100 MG tablet, Take 1 tablet (100 mg total) by mouth every 12 (twelve) hours., Disp: 60 tablet, Rfl: 4   empagliflozin (JARDIANCE) 10 MG TABS tablet, Take 1 tablet (10 mg total) by mouth daily before breakfast., Disp: 90 tablet, Rfl: 3   etodolac (LODINE) 400 MG tablet, Take 400 mg by mouth every 8 (eight) hours as needed., Disp: , Rfl:    famotidine (PEPCID) 20 MG tablet, Take 1 tablet (20 mg total) by mouth 2 (two) times daily., Disp: 60 tablet, Rfl: 0   fluvoxaMINE (LUVOX) 100 MG tablet, Take 1 tablet (100 mg total) by mouth at bedtime., Disp: 30 tablet, Rfl: 0   gabapentin (NEURONTIN) 100 MG capsule, Take 1 capsule (100 mg total) by mouth 3 (three) times daily., Disp: 90 capsule, Rfl: 0   HYDROcodone-acetaminophen (NORCO/VICODIN) 5-325 MG tablet, Take by mouth., Disp: , Rfl:    Lactobacillus (ACIDOPHILUS) 100 MG CAPS, Take 1 capsule (100 mg total) by mouth 2 (two) times daily., Disp: 60 capsule, Rfl: 0   lamoTRIgine (LAMICTAL) 100 MG tablet, Take 200 mg by mouth at bedtime., Disp: , Rfl:    lamoTRIgine (LAMICTAL) 200 MG tablet, Take 1 tablet (200 mg total) by mouth daily., Disp: 30 tablet, Rfl: 0   levothyroxine (SYNTHROID) 88 MCG tablet, Take 1 tablet (88 mcg total) by mouth daily before breakfast., Disp: 30 tablet, Rfl: 0   losartan (COZAAR) 25 MG tablet, Take 1 tablet (25 mg total) by mouth at bedtime.,  Disp: 30 tablet, Rfl: 0   melatonin 5 MG TABS, Take 1 tablet (5 mg total) by mouth at bedtime as needed., Disp: 30 tablet, Rfl: 0   metFORMIN (GLUCOPHAGE) 500 MG tablet, Take 1 tablet (500 mg total) by mouth 2 (two) times daily with a meal., Disp: 60 tablet, Rfl: 0   Multiple Vitamin (MULTIVITAMIN) tablet, Take 1 tablet by mouth daily.  , Disp: , Rfl:    ondansetron (ZOFRAN) 4 MG tablet, Take 1 tablet (4 mg total) by mouth every 8 (eight) hours as needed for refractory nausea / vomiting., Disp: 20 tablet, Rfl: 0   PAXLOVID, 300/100, 20 x  150 MG & 10 x 100MG  TBPK, Take by mouth. (Patient not taking: Reported on 06/30/2023), Disp: , Rfl:    potassium chloride (KLOR-CON) 10 MEQ tablet, Take 30 mEq by mouth 2 (two) times daily., Disp: , Rfl:    RESTASIS MULTIDOSE 0.05 % ophthalmic emulsion, 1 drop 2 (two) times daily., Disp: , Rfl:    rosuvastatin (CRESTOR) 20 MG tablet, 1 tablet by mouth Once a day for 30 days, Disp: , Rfl:    sodium chloride 1 g tablet, Take 1 tablet (1 g total) by mouth 2 (two) times daily with a meal., Disp: 60 tablet, Rfl: 0   traMADol (ULTRAM) 50 MG tablet, Take 50 mg by mouth every 6 (six) hours as needed., Disp: , Rfl:    traZODone (DESYREL) 100 MG tablet, Take 1 tablet (100 mg total) by mouth at bedtime., Disp: 30 tablet, Rfl: 0   traZODone (DESYREL) 50 MG tablet, Take 50-100 mg by mouth at bedtime., Disp: , Rfl:  No current facility-administered medications for this visit.  Facility-Administered Medications Ordered in Other Visits:    0.9 %  sodium chloride infusion, , Intravenous, Continuous, Ralene Muskrat, PA-C   acetaminophen (TYLENOL) tablet 650 mg, 650 mg, Oral, Q6H PRN, de Melchor Amour, Lyden, MD   HYDROcodone-acetaminophen (NORCO/VICODIN) 5-325 MG per tablet 1 tablet, 1 tablet, Oral, Q4H PRN, de Melchor Amour, Jerilynn Mages, MD   Review of Systems  Constitutional:  Negative for activity change, appetite change, chills, diaphoresis, fatigue, fever and unexpected weight change.  HENT:  Negative for congestion, rhinorrhea, sinus pressure, sneezing, sore throat and trouble swallowing.   Eyes:  Negative for photophobia and visual disturbance.  Respiratory:  Negative for cough, chest tightness, shortness of breath, wheezing and stridor.   Cardiovascular:  Negative for chest pain, palpitations and leg swelling.  Gastrointestinal:  Negative for abdominal distention, abdominal pain, anal bleeding, blood in stool, constipation, diarrhea, nausea and vomiting.  Genitourinary:  Negative for difficulty  urinating, dysuria, flank pain and hematuria.  Musculoskeletal:  Positive for back pain. Negative for arthralgias, gait problem, joint swelling and myalgias.  Skin:  Negative for color change, pallor, rash and wound.  Neurological:  Negative for dizziness, tremors, weakness and light-headedness.  Hematological:  Negative for adenopathy. Does not bruise/bleed easily.  Psychiatric/Behavioral:  Negative for agitation, behavioral problems, confusion, decreased concentration, dysphoric mood and sleep disturbance.        Objective:   Physical Exam Constitutional:      General: She is not in acute distress.    Appearance: Normal appearance. She is well-developed. She is not ill-appearing or diaphoretic.  HENT:     Head: Normocephalic and atraumatic.     Right Ear: Hearing and external ear normal.     Left Ear: Hearing and external ear normal.     Nose: No nasal deformity or rhinorrhea.  Eyes:  General: No scleral icterus.    Conjunctiva/sclera: Conjunctivae normal.     Right eye: Right conjunctiva is not injected.     Left eye: Left conjunctiva is not injected.     Pupils: Pupils are equal, round, and reactive to light.  Neck:     Vascular: No JVD.  Cardiovascular:     Rate and Rhythm: Normal rate and regular rhythm.     Heart sounds: Normal heart sounds, S1 normal and S2 normal. No murmur heard.    No friction rub.  Abdominal:     General: Bowel sounds are normal. There is no distension.     Palpations: Abdomen is soft.     Tenderness: There is no abdominal tenderness.  Musculoskeletal:        General: Normal range of motion.     Right shoulder: Normal.     Left shoulder: Normal.     Cervical back: Normal range of motion and neck supple.     Right hip: Normal.     Left hip: Normal.     Right knee: Normal.     Left knee: Normal.  Lymphadenopathy:     Head:     Right side of head: No submandibular, preauricular or posterior auricular adenopathy.     Left side of head: No  submandibular, preauricular or posterior auricular adenopathy.     Cervical: No cervical adenopathy.     Right cervical: No superficial or deep cervical adenopathy.    Left cervical: No superficial or deep cervical adenopathy.  Skin:    General: Skin is warm and dry.     Coloration: Skin is not pale.     Findings: No abrasion, bruising, ecchymosis, erythema, lesion or rash.     Nails: There is no clubbing.  Neurological:     General: No focal deficit present.     Mental Status: She is alert and oriented to person, place, and time.     Sensory: No sensory deficit.     Coordination: Coordination normal.     Gait: Gait normal.  Psychiatric:        Attention and Perception: She is attentive.        Mood and Affect: Mood normal.        Speech: Speech normal.        Behavior: Behavior normal. Behavior is cooperative.        Thought Content: Thought content normal.        Judgment: Judgment normal.          Assessment & Plan:   Assessment and Plan    Lumbar Diskitis associated with hardware Worsening lower back pain. Recent aspiration performed. Pending culture results. Discussed the need for empiric antibiotic therapy due to the risk of sepsis and the patient's history of ventriculitis. --followup culture data and hopefully can have a targetted plan of therapy -Start oral Doxycycline and Cefadroxil empirically -Plan for IV antibiotics (Daptomycin and possibly Cefepime if cultures NG) once PICC line is placed and approved by insurance. -Order PICC line and initiate process for home health. -Check inflammatory markers and kidney function today. -Follow-up appointment scheduled for February 10th, 2025.  Ventriculitis (history) Discussed the previous treatment and the risk of recurrence due to spinal fluid leak. No current symptoms suggestive of recurrence. -Continue current management.  Ventricular Septal Defect (history) No current issues related to this condition. -Continue  current management.      Lung nodule: PET scan today and if abnormal bronchoscopy with BAL Histo,  blasto, crypto ag's negative Cocci ab negative  Note I conveyed the above plan to Dr. Clyda Greener with Neurosurgery  I have personally spent 40 minutes involved in face-to-face and non-face-to-face activities for this patient on the day of the visit. Professional time spent includes the following activities: Preparing to see the patient (review of tests), Obtaining and/or reviewing separately obtained history (admission/discharge record), Performing a medically appropriate examination and/or evaluation , Ordering medications/tests/procedures, referring and communicating with other health care professionals, Documenting clinical information in the EMR, Independently interpreting results (not separately reported), Communicating results to the patient/family/caregiver, Counseling and educating the patient/family/caregiver and Care coordination (not separately reported).

## 2023-08-09 NOTE — Procedures (Signed)
INTERVENTIONAL NEURORADIOLOGY BRIEF POSTPROCEDURE NOTE  FLUOROSCOPY GUIDED L4-5 FINE NEEDLE DISC ASPIRATION  Attending physician: Baldemar Lenis, MD  Diagnosis: Discitis  Access site: Percutaneous right extrapedicular.  Anesthesia: IR sedation: Moderate sedation.  Medication used: 3 mg Versed IV; 200 mcg Fentanyl IV.  Complications: None.  Estimated blood loss: None.  Specimen:  1 mL disc aspirate.  Findings: small amount of blood tinged fluid obtained and sent to lab for culture.  The patient tolerated the procedure well without incident or complication and is in stable condition.

## 2023-08-09 NOTE — Telephone Encounter (Signed)
Auth Submission: NO AUTH NEEDED Site of care: Site of care: [facility] Payer: [employer]BS Medication & CPT/J Code(s) submitted: Reclast (Zolendronic acid) [id number] Route of submission (phone, fax, portal):  Phone # Fax # Auth type: Buy/Bill PB Units/visits requested: 5mg  x 1 dose Reference number:  Approval from: 06/24/23 to 07/18/24

## 2023-08-10 ENCOUNTER — Ambulatory Visit (HOSPITAL_COMMUNITY)
Admission: RE | Admit: 2023-08-10 | Discharge: 2023-08-10 | Disposition: A | Discharge status: Home / Self Care | Payer: Medicare Other | Source: Ambulatory Visit | Attending: Emergency Medicine | Admitting: Emergency Medicine

## 2023-08-10 ENCOUNTER — Other Ambulatory Visit: Payer: Self-pay

## 2023-08-10 ENCOUNTER — Telehealth: Payer: Self-pay

## 2023-08-10 ENCOUNTER — Ambulatory Visit: Payer: Medicare Other | Admitting: Infectious Disease

## 2023-08-10 ENCOUNTER — Telehealth: Payer: Self-pay | Admitting: Pulmonary Disease

## 2023-08-10 VITALS — BP 128/78 | HR 83 | Temp 97.9°F | Ht 61.0 in | Wt 135.0 lb

## 2023-08-10 DIAGNOSIS — G0490 Encephalitis and encephalomyelitis, unspecified: Secondary | ICD-10-CM

## 2023-08-10 DIAGNOSIS — R911 Solitary pulmonary nodule: Secondary | ICD-10-CM | POA: Insufficient documentation

## 2023-08-10 DIAGNOSIS — M4626 Osteomyelitis of vertebra, lumbar region: Secondary | ICD-10-CM | POA: Diagnosis not present

## 2023-08-10 DIAGNOSIS — B9689 Other specified bacterial agents as the cause of diseases classified elsewhere: Secondary | ICD-10-CM

## 2023-08-10 DIAGNOSIS — T847XXS Infection and inflammatory reaction due to other internal orthopedic prosthetic devices, implants and grafts, sequela: Secondary | ICD-10-CM | POA: Diagnosis not present

## 2023-08-10 DIAGNOSIS — M8008XS Age-related osteoporosis with current pathological fracture, vertebra(e), sequela: Secondary | ICD-10-CM

## 2023-08-10 LAB — GLUCOSE, CAPILLARY: Glucose-Capillary: 109 mg/dL — ABNORMAL HIGH (ref 70–99)

## 2023-08-10 MED ORDER — DOXYCYCLINE HYCLATE 100 MG PO TABS: 100.0000 mg | ORAL_TABLET | Freq: Two times a day (BID) | ORAL | 0 refills | Status: DC

## 2023-08-10 MED ORDER — CEFADROXIL 500 MG PO CAPS: 1000.0000 mg | ORAL_CAPSULE | Freq: Two times a day (BID) | ORAL | 0 refills | Status: DC

## 2023-08-10 NOTE — Telephone Encounter (Signed)
Per Dr. Daiva Eves scheduled appt for picc placement with [facility] 2/3 at 2pm. Patient aware of appt date/time. No questions at this time.  Opat order pending culture results. Will update HH once MD places opat.  Juanita Laster, RMA

## 2023-08-10 NOTE — Addendum Note (Signed)
Addended by: Randall Hiss on: 08/10/2023 11:47 AM   Modules accepted: Orders

## 2023-08-11 LAB — CBC WITH DIFFERENTIAL/PLATELET
Absolute Lymphocytes: 1925 {cells}/uL (ref 850–3900)
Absolute Monocytes: 792 {cells}/uL (ref 200–950)
Basophils Absolute: 88 {cells}/uL (ref 0–200)
Basophils Relative: 0.8 %
Eosinophils Absolute: 220 {cells}/uL (ref 15–500)
Eosinophils Relative: 2 %
HCT: 35.4 % (ref 35.0–45.0)
Hemoglobin: 11.3 g/dL — ABNORMAL LOW (ref 11.7–15.5)
MCH: 28 pg (ref 27.0–33.0)
MCHC: 31.9 g/dL — ABNORMAL LOW (ref 32.0–36.0)
MCV: 87.6 fL (ref 80.0–100.0)
MPV: 8.1 fL (ref 7.5–12.5)
Monocytes Relative: 7.2 %
Neutro Abs: 7975 {cells}/uL — ABNORMAL HIGH (ref 1500–7800)
Neutrophils Relative %: 72.5 %
Platelets: 667 10*3/uL — ABNORMAL HIGH (ref 140–400)
RBC: 4.04 10*6/uL (ref 3.80–5.10)
RDW: 15.8 % — ABNORMAL HIGH (ref 11.0–15.0)
Total Lymphocyte: 17.5 %
WBC: 11 10*3/uL — ABNORMAL HIGH (ref 3.8–10.8)

## 2023-08-11 LAB — BASIC METABOLIC PANEL WITH GFR
BUN: 9 mg/dL (ref 7–25)
CO2: 24 mmol/L (ref 20–32)
Calcium: 9.1 mg/dL (ref 8.6–10.4)
Chloride: 99 mmol/L (ref 98–110)
Creat: 0.52 mg/dL (ref 0.50–1.05)
Glucose, Bld: 149 mg/dL — ABNORMAL HIGH (ref 65–99)
Potassium: 4.5 mmol/L (ref 3.5–5.3)
Sodium: 134 mmol/L — ABNORMAL LOW (ref 135–146)
eGFR: 101 mL/min/{1.73_m2} (ref >=60)

## 2023-08-11 LAB — SEDIMENTATION RATE: Sed Rate: 63 mm/h — ABNORMAL HIGH (ref 0–30)

## 2023-08-11 LAB — C-REACTIVE PROTEIN: CRP: 60.5 mg/L — ABNORMAL HIGH (ref <8.0)

## 2023-08-11 NOTE — Telephone Encounter (Signed)
Thank you, I agree with that plan.

## 2023-08-14 LAB — AEROBIC/ANAEROBIC CULTURE W GRAM STAIN (SURGICAL/DEEP WOUND)
Culture: NO GROWTH
Gram Stain: NONE SEEN

## 2023-08-15 ENCOUNTER — Telehealth: Payer: Self-pay | Admitting: Infectious Disease

## 2023-08-15 ENCOUNTER — Telehealth: Payer: Self-pay

## 2023-08-15 DIAGNOSIS — N760 Acute vaginitis: Secondary | ICD-10-CM

## 2023-08-15 MED ORDER — METRONIDAZOLE 500 MG PO TABS: 500.0000 mg | ORAL_TABLET | Freq: Two times a day (BID) | ORAL | 0 refills | Status: AC

## 2023-08-15 NOTE — Telephone Encounter (Signed)
Spoke with Kathie Rhodes, notified her that cultures are final and showed no growth. Discussed OPAT plan and reminded her of PICC appointment on Monday.   Discussed vaginal discharge symptoms and Dr. Zenaida Niece Dam's recommendation to try metronidazole, she would like to try it. Rx sent to [facility] on [address].   New OPAT orders sent to Jeri Modena, RN with [employer]. Will await response to determine first dose location.   Sandie Ano, RN

## 2023-08-15 NOTE — Telephone Encounter (Addendum)
Per Jeri Modena, RN with [employer], patient will have first doses of antibiotics in home on 2/4. Notified IR that no need for short stay.   Sandie Ano, RN

## 2023-08-15 NOTE — Telephone Encounter (Signed)
Diagnosis: HW associated diskitis, vertebral osteomyelitis  Culture Result: NO GROWTH  No Known Allergies  OPAT Orders Discharge antibiotics to be given via PICC line Discharge antibiotics: Daptomycin 500mg  IV daily along with cefepime 2 grams IV q 12 hours  Duration: 6 weeks End Date: March 17th  [facility] Care Per Protocol:  Home health RN for IV administration and teaching; PICC line care and labs.    Labs weekly while on IV antibiotics: _x_ CBC with differential x__ BMP __ CMP _x_ CRP _x_ ESR __ Vancomycin trough _x_ CK  x__ Please pull PIC at completion of IV antibiotics __ Please leave PIC in place until doctor has seen patient or been notified  Fax weekly labs to [phone]

## 2023-08-15 NOTE — Telephone Encounter (Addendum)
Patient left voicemail stating she thinks she's having a reaction to the cefadroxil. Also reports she's back on doxycycline. Called patient back to further assess.   States she's had brown vaginal discharge for the last 4-5 days and is having to wear a pull-up brief. Denies any foul odor or itching, reports it's not like a yeast infection. States the drainage resembles "french onion soup."   Kathie Rhodes says she had a total hysterectomy at age 68 and has not had problems with drainage since that time. She does not have a gynecologist she can see.   She is also asking if culture results from biopsy are back yet.   Sandie Ano, RN

## 2023-08-15 NOTE — Progress Notes (Signed)
If you give 500 mg vial, it's right at 8 mg/kg given her most recent weight on 1/22.

## 2023-08-15 NOTE — Addendum Note (Signed)
Addended by: Linna Hoff D on: 08/15/2023 02:55 PM   Modules accepted: Orders

## 2023-08-15 NOTE — Progress Notes (Signed)
Yes that sounds great

## 2023-08-16 ENCOUNTER — Ambulatory Visit (HOSPITAL_COMMUNITY): Payer: Medicare Other

## 2023-08-18 ENCOUNTER — Ambulatory Visit: Payer: Medicare Other | Admitting: Emergency Medicine

## 2023-08-18 ENCOUNTER — Encounter: Payer: Self-pay | Admitting: Emergency Medicine

## 2023-08-18 VITALS — BP 132/76 | HR 87 | Temp 98.1°F | Ht 61.0 in | Wt 139.8 lb

## 2023-08-18 DIAGNOSIS — R911 Solitary pulmonary nodule: Secondary | ICD-10-CM | POA: Diagnosis not present

## 2023-08-18 DIAGNOSIS — F17200 Nicotine dependence, unspecified, uncomplicated: Secondary | ICD-10-CM

## 2023-08-18 DIAGNOSIS — F17210 Nicotine dependence, cigarettes, uncomplicated: Secondary | ICD-10-CM | POA: Diagnosis not present

## 2023-08-18 MED ORDER — NICOTINE 7 MG/24HR TD PT24: 7.0000 mg | MEDICATED_PATCH | Freq: Every day | TRANSDERMAL | 0 refills | Status: DC

## 2023-08-18 NOTE — Assessment & Plan Note (Signed)
>>  ASSESSMENT AND PLAN FOR TOBACCO ABUSE WRITTEN ON 08/18/2023 12:02 PM BY BYRUM, Les Pou, MD  Suspect she has some COPD although asymptomatic at this time.  We will consider pulmonary function testing going forward.  We talked in detail today about her tobacco use, goals for cessation.  She is motivated to stop.  Talked about techniques.  For starters we will work on cutting down to her previous number of cigarettes, 4 to 5 cigarettes daily.  I will give her prescription for nicotine patches to help get her there.  If she is able to cut down then we will talk about setting a quit date and her follow-up visit.

## 2023-08-18 NOTE — Assessment & Plan Note (Signed)
Suspect she has some COPD although asymptomatic at this time.  We will consider pulmonary function testing going forward.  We talked in detail today about her tobacco use, goals for cessation.  She is motivated to stop.  Talked about techniques.  For starters we will work on cutting down to her previous number of cigarettes, 4 to 5 cigarettes daily.  I will give her prescription for nicotine patches to help get her there.  If she is able to cut down then we will talk about setting a quit date and her follow-up visit.

## 2023-08-18 NOTE — Progress Notes (Signed)
Subjective:    Patient ID: [name], female    DOB: 1956/06/13, 68 y.o.   MRN: [id number]  HPI 68 year old woman with a history of tobacco use (30 pack years), bipolar depression, hypertension, diabetes, GERD.she has a history of scoliosis surgery with thoracic and lumbar fusions over 25 years ago with more recent complications after a fall.  She went for lumbar surgery 06/27/2023 complicated by wound dehiscence with exposed hardware and subsequently lumbar screw and rod removal.  Course then complicated by bacterial ventriculitis with a prolonged ICU stay, ultimately discharged to CIR on vancomycin and cefepime through March 2024, then doxycycline and cefdinir (prolonged). She was seen by [place] during that admission for the critical care aspect of the illness that included respiratory failure and tracheostomy.  She had a CT scan of her chest 08/19/2022 during the hospitalization that showed right hilar adenopathy, small bilateral effusions right greater than left, multifocal patchy airspace disease and groundglass opacities.  Discrete nodular disease was unable to be appreciated given the background infiltrates.  Lung cancer screening CT chest done 05/10/2023 reviewed by me showed resolution of the infiltrates from February, patent central airways, no mediastinal adenopathy, and irregular solid nodule in the right lower lobe 8 mm, solid 6.5 mm right lower lobe nodule   ROV 08/18/2023 --68 year old woman with a history of tobacco use and possible COPD.  I met her earlier this month after a complicated hospitalization for osteomyelitis in the lumbar spine following hardware and wound dehiscence, then subsequently ventriculitis with a prolonged ICU stay.  She had a tracheostomy, since decannulated.  She participates in lung cancer screening program and had a CT 05/10/2023 that showed an 8 mm right lower lobe nodule in the region where she had significant airspace disease during her critical illness.   We performed PET to further evaluate. Today she reports   PET scan done 08/10/2023 and reviewed by me, shows her right lower lobe nodule is a bit less prominent, possibly smaller, certainly not larger.  There is no apparent hypermetabolism associated with the nodule.    Review of Systems As per HPI  Past Medical History:  Diagnosis Date   Anemia    Anxiety    Arthritis    Bipolar disorder (HCC)    Depression    GERD (gastroesophageal reflux disease)    Heart murmur    "related to VSD"   High cholesterol    History of blood transfusion    "related to OR" (08/19/2016)   History of hiatal hernia    Hypertension    Hyperthyroidism    Mild cognitive impairment 09/06/2018   Paroxysmal ventricular tachycardia (HCC)    Type II diabetes mellitus (HCC)    UTI (urinary tract infection)    being treated with Keflex   Ventricular septal defect    Ventriculitis of brain due to bacteria 11/02/2022   Vertebral fracture, osteoporotic (HCC) 01/06/2023     Family History  Problem Relation Age of Onset   Other Mother        alive   Stroke Father 5       deceased   Dementia Father    Chorea Maternal Grandfather    Dementia Maternal Aunt    Dementia Maternal Aunt    Heart attack Other        multiple uncles have died with myocardial infarction     Social History   Socioeconomic History   Marital status: Married    Spouse name: Not on file  Number of children: Not on file   Years of education: Not on file   Highest education level: Master's degree (e.g., MA, MS, MEng, MEd, MSW, MBA)  Occupational History   Occupation: Magazine features editor: SELF-EMPLOYED    Comment: former  Tobacco Use   Smoking status: Former    Types: Cigarettes    Passive exposure: Never   Smokeless tobacco: Never   Tobacco comments:    03/30/21 smokes 2 cigs daily.      08/06/22 Shanda Bumps stated that the patient is not smoking while at [facility] for rehab. K[facility]  Vaping Use   Vaping status: Never Used   Substance and Sexual Activity   Alcohol use: No   Drug use: No   Sexual activity: Not Currently    Birth control/protection: Surgical    Comment: Hysterectomy  Other Topics Concern   Not on file  Social History Narrative   Right handed   Caffeine 2-3 cups daily    Lives at home with husband    Social Drivers of Health   Financial Resource Strain: Low Risk  (07/08/2023)   Received from [facility]   Overall Financial Resource Strain (CARDIA)    Difficulty of Paying Living Expenses: Not hard at all  Food Insecurity: No Food Insecurity (07/08/2023)   Received from [facility]   Hunger Vital Sign    Worried About Running Out of Food in the Last Year: Never true    Ran Out of Food in the Last Year: Never true  Transportation Needs: No Transportation Needs (07/08/2023)   Received from [facility] - Transportation    In the past 12 months, has lack of transportation kept you from medical appointments or from getting medications?: No    Lack of Transportation (Non-Medical): No  Physical Activity: Not on file  Stress: Not on file  Social Connections: Not on file  Intimate Partner Violence: Not At Risk (06/27/2022)   Humiliation, Afraid, Rape, and Kick questionnaire    Fear of Current or Ex-Partner: No    Emotionally Abused: No    Physically Abused: No    Sexually Abused: No     Allergies  Allergen Reactions   Gallium (Radioactive Isotope) Swelling    Pt states she can not have radioactive injections.      Outpatient Medications Prior to Visit  Medication Sig Dispense Refill   acetaminophen (TYLENOL) 325 MG tablet Take 1-2 tablets (325-650 mg total) by mouth every 6 (six) hours as needed for mild pain or headache.     buPROPion (WELLBUTRIN XL) 150 MG 24 hr tablet Take 1 tablet (150 mg total) by mouth daily. 30 tablet 0   carvedilol (COREG) 25 MG tablet Take 1 tablet (25 mg total) by mouth 2 (two) times daily  with a meal. 60 tablet 0   cefadroxil (DURICEF) 500 MG capsule Take 2 capsules (1,000 mg total) by mouth 2 (two) times daily. 120 capsule 0   cyclobenzaprine (FLEXERIL) 10 MG tablet Take 10 mg by mouth 3 (three) times daily.     diphenoxylate-atropine (LOMOTIL) 2.5-0.025 MG tablet Take 1 tablet by mouth 2 (two) times daily as needed for diarrhea or loose stools. 30 tablet 0   donepezil (ARICEPT) 10 MG tablet Take 10 mg by mouth every morning.     doxycycline (VIBRA-TABS) 100 MG tablet Take 1 tablet (100 mg total) by mouth every 12 (twelve) hours. 60 tablet 0   empagliflozin (  JARDIANCE) 10 MG TABS tablet Take 1 tablet (10 mg total) by mouth daily before breakfast. 90 tablet 3   etodolac (LODINE) 400 MG tablet Take 400 mg by mouth every 8 (eight) hours as needed.     famotidine (PEPCID) 20 MG tablet Take 1 tablet (20 mg total) by mouth 2 (two) times daily. 60 tablet 0   fluvoxaMINE (LUVOX) 100 MG tablet Take 1 tablet (100 mg total) by mouth at bedtime. 30 tablet 0   gabapentin (NEURONTIN) 100 MG capsule Take 1 capsule (100 mg total) by mouth 3 (three) times daily. 90 capsule 0   HYDROcodone-acetaminophen (NORCO/VICODIN) 5-325 MG tablet Take by mouth.     Lactobacillus (ACIDOPHILUS) 100 MG CAPS Take 1 capsule (100 mg total) by mouth 2 (two) times daily. 60 capsule 0   lamoTRIgine (LAMICTAL) 100 MG tablet Take 200 mg by mouth at bedtime.     levothyroxine (SYNTHROID) 88 MCG tablet Take 1 tablet (88 mcg total) by mouth daily before breakfast. 30 tablet 0   losartan (COZAAR) 25 MG tablet Take 1 tablet (25 mg total) by mouth at bedtime. 30 tablet 0   melatonin 5 MG TABS Take 1 tablet (5 mg total) by mouth at bedtime as needed. 30 tablet 0   metFORMIN (GLUCOPHAGE) 500 MG tablet Take 1 tablet (500 mg total) by mouth 2 (two) times daily with a meal. 60 tablet 0   metroNIDAZOLE (FLAGYL) 500 MG tablet Take 1 tablet (500 mg total) by mouth 2 (two) times daily for 7 days. 14 tablet 0   Multiple Vitamin  (MULTIVITAMIN) tablet Take 1 tablet by mouth daily.       ondansetron (ZOFRAN) 4 MG tablet Take 1 tablet (4 mg total) by mouth every 8 (eight) hours as needed for refractory nausea / vomiting. 20 tablet 0   potassium chloride (KLOR-CON) 10 MEQ tablet Take 30 mEq by mouth 2 (two) times daily.     RESTASIS MULTIDOSE 0.05 % ophthalmic emulsion 1 drop 2 (two) times daily.     rosuvastatin (CRESTOR) 20 MG tablet 1 tablet by mouth Once a day for 30 days     sodium chloride 1 g tablet Take 1 tablet (1 g total) by mouth 2 (two) times daily with a meal. 60 tablet 0   traZODone (DESYREL) 100 MG tablet Take 1 tablet (100 mg total) by mouth at bedtime. 30 tablet 0   cyclobenzaprine (FLEXERIL) 10 MG tablet Take 1 tablet by mouth 3 (three) times daily. (Patient not taking: Reported on 08/18/2023)     diazepam (VALIUM) 5 MG tablet as needed. (Patient not taking: Reported on 06/30/2023)     lamoTRIgine (LAMICTAL) 200 MG tablet Take 1 tablet (200 mg total) by mouth daily. (Patient not taking: Reported on 08/18/2023) 30 tablet 0   PAXLOVID, 300/100, 20 x 150 MG & 10 x 100MG  TBPK Take by mouth. (Patient not taking: Reported on 06/30/2023)     traMADol (ULTRAM) 50 MG tablet Take 50 mg by mouth every 6 (six) hours as needed. (Patient not taking: Reported on 08/18/2023)     traZODone (DESYREL) 50 MG tablet Take 50-100 mg by mouth at bedtime. (Patient not taking: Reported on 08/18/2023)     No facility-administered medications prior to visit.        Objective:   Physical Exam Vitals:   08/18/23 1128  BP: 132/76  Pulse: 87  Temp: 98.1 F (36.7 C)  TempSrc: Oral  SpO2: 97%  Weight: 139 lb 12.8 oz (63.4 kg)  Height: 5\' 1"  (1.549 m)     Gen: Pleasant, well-nourished, in no distress,  normal affect  ENT: No lesions,  mouth clear,  oropharynx clear, no postnasal drip  Neck: No JVD, no stridor  Lungs: No use of accessory muscles, no crackles or wheezing on normal respiration, no wheeze on forced  expiration  Cardiovascular: RRR, heart sounds normal, no murmur or gallops, no peripheral edema  Musculoskeletal: No deformities, no cyanosis or clubbing  Neuro: alert, awake, non focal  Skin: Warm, no lesions or rash      Assessment & Plan:   Pulmonary nodule Appears stable, less solid on the PET scan.  No apparent hypermetabolism.  The final report is pending and we will follow this.  For now I think it is appropriate to plan for a repeat CT scan of the chest in 6 months to ensure interval stability.  If suspicion for malignancy increases then we will talk about diagnostics, probably bronchoscopy.  TOBACCO ABUSE Suspect she has some COPD although asymptomatic at this time.  We will consider pulmonary function testing going forward.  We talked in detail today about her tobacco use, goals for cessation.  She is motivated to stop.  Talked about techniques.  For starters we will work on cutting down to her previous number of cigarettes, 4 to 5 cigarettes daily.  I will give her prescription for nicotine patches to help get her there.  If she is able to cut down then we will talk about setting a quit date and her follow-up visit.  Time spent independent of smoking cessation counseling 35 minutes  Levy Pupa, MD, PhD 08/18/2023, 12:03 PM [facility] [phone] or if no answer before 7:00PM call [phone] For any issues after 7:00PM please call eLink [phone]

## 2023-08-18 NOTE — Assessment & Plan Note (Signed)
Appears stable, less solid on the PET scan.  No apparent hypermetabolism.  The final report is pending and we will follow this.  For now I think it is appropriate to plan for a repeat CT scan of the chest in 6 months to ensure interval stability.  If suspicion for malignancy increases then we will talk about diagnostics, probably bronchoscopy.

## 2023-08-18 NOTE — Patient Instructions (Signed)
We reviewed your PET scan today.  This was a reassuring test.  Good news. We will follow your small pulmonary nodule with a repeat CT scan of the chest in July 2025. We discussed your tobacco use and the goal of cutting down and ultimately stopping.  We talked about strategies to help you succeed.  We will order nicotine patches 7 mg to help you.  We set a goal to get down to 4 cigarettes daily by her next visit.  If you are able to accomplish this then we can talk seriously about setting a quit date.  We set a goal to get down to 4 cigarettes daily by her next visit.  If you are able to accomplish this then we can talk seriously about setting a quit date and other ways to help you. We we will consider pulmonary function testing at some point in the future.  We will hold off on starting any inhaled medications at this time Follow Dr. Delton Coombes in July after your CT so we can review those results together.

## 2023-08-22 ENCOUNTER — Ambulatory Visit (HOSPITAL_COMMUNITY)
Admission: RE | Admit: 2023-08-22 | Discharge: 2023-08-22 | Disposition: A | Discharge status: Home / Self Care | Payer: Medicare Other | Source: Ambulatory Visit | Attending: Infectious Disease | Admitting: Infectious Disease

## 2023-08-22 DIAGNOSIS — M4645 Discitis, unspecified, thoracolumbar region: Secondary | ICD-10-CM | POA: Insufficient documentation

## 2023-08-22 MED ORDER — LIDOCAINE HCL 1 % IJ SOLN
INTRAMUSCULAR | Status: AC
Start: 2023-08-22 — End: ?
  Filled 2023-08-22: qty 20

## 2023-08-22 MED ORDER — HEPARIN SOD (PORK) LOCK FLUSH 100 UNIT/ML IV SOLN
500.0000 [IU] | Freq: Once | INTRAVENOUS | Status: AC
  Administered 2023-08-22: 500 [IU] via INTRAVENOUS

## 2023-08-22 MED ORDER — HEPARIN SOD (PORK) LOCK FLUSH 100 UNIT/ML IV SOLN
INTRAVENOUS | Status: AC
Start: 2023-08-22 — End: ?
  Filled 2023-08-22: qty 5

## 2023-08-22 MED ORDER — LIDOCAINE HCL 1 % IJ SOLN
20.0000 mL | Freq: Once | INTRAMUSCULAR | Status: AC
  Administered 2023-08-22: 10 mL

## 2023-08-22 NOTE — Procedures (Signed)
PROCEDURE SUMMARY:  Successful placement of left lumen PICC line to basilic vein. Length 45cm Tip at lower SVC/RA No complications PICC capped Ready for use. EBL = trace  Please see full dictation in Imaging section for details.   Electronically Signed: Sable Feil, PA-C 08/22/2023, 3:01 PM

## 2023-08-29 ENCOUNTER — Ambulatory Visit: Payer: Self-pay | Admitting: Infectious Disease

## 2023-08-31 LAB — LAB REPORT - SCANNED
Calcium: 9
EGFR: 102

## 2023-09-01 ENCOUNTER — Telehealth: Payer: Self-pay

## 2023-09-01 NOTE — Telephone Encounter (Signed)
Patient agreeable to HOLD cefepime and to continue taking daptomycin. Patient will call back next week to update.

## 2023-09-01 NOTE — Telephone Encounter (Signed)
PT with [facility] left voicemail stating that patient wasn't feeling well at todays visit - BP up and down, light headed, nauseous and weak HR stayed between 100-120.    Called patient - patient stated that she hasn't been feeling well since last week. Light headed when walking, nausea and vomiting bile,  weakness. Patient is thinking its side effects from IV abx's. Advised I would send message back to provider and pharmacy staff.   Nickolus Wadding Lesli Albee, CMA

## 2023-09-02 NOTE — Telephone Encounter (Signed)
Patient's daughter called, states they were told to call back in a few days to report how [name] is feeling, but that would be on Saturday when the office is closed.   Would like to know what they should do over the weekend. If they should restart cefepime or continue with just daptomycin or if another medication needs to be added.   They are concerned about her only taking one antibiotic over the weekend and potentially prolonging her duration of treatment. Will route to provider.   Sandie Ano, RN

## 2023-09-05 ENCOUNTER — Ambulatory Visit: Payer: Medicare Other | Admitting: Obstetrics

## 2023-09-05 ENCOUNTER — Encounter: Payer: Self-pay | Admitting: Obstetrics

## 2023-09-05 VITALS — BP 122/83 | HR 103 | Ht 61.81 in | Wt 133.0 lb

## 2023-09-05 DIAGNOSIS — N952 Postmenopausal atrophic vaginitis: Secondary | ICD-10-CM | POA: Diagnosis not present

## 2023-09-05 DIAGNOSIS — R351 Nocturia: Secondary | ICD-10-CM | POA: Diagnosis not present

## 2023-09-05 DIAGNOSIS — S22079A Unspecified fracture of T9-T10 vertebra, initial encounter for closed fracture: Secondary | ICD-10-CM | POA: Insufficient documentation

## 2023-09-05 DIAGNOSIS — N766 Ulceration of vulva: Secondary | ICD-10-CM | POA: Diagnosis not present

## 2023-09-05 DIAGNOSIS — F17200 Nicotine dependence, unspecified, uncomplicated: Secondary | ICD-10-CM

## 2023-09-05 DIAGNOSIS — R159 Full incontinence of feces: Secondary | ICD-10-CM | POA: Insufficient documentation

## 2023-09-05 DIAGNOSIS — Z87898 Personal history of other specified conditions: Secondary | ICD-10-CM | POA: Insufficient documentation

## 2023-09-05 DIAGNOSIS — Z8739 Personal history of other diseases of the musculoskeletal system and connective tissue: Secondary | ICD-10-CM | POA: Insufficient documentation

## 2023-09-05 DIAGNOSIS — G3184 Mild cognitive impairment, so stated: Secondary | ICD-10-CM

## 2023-09-05 DIAGNOSIS — M810 Age-related osteoporosis without current pathological fracture: Secondary | ICD-10-CM | POA: Insufficient documentation

## 2023-09-05 DIAGNOSIS — T847XXS Infection and inflammatory reaction due to other internal orthopedic prosthetic devices, implants and grafts, sequela: Secondary | ICD-10-CM

## 2023-09-05 DIAGNOSIS — Z720 Tobacco use: Secondary | ICD-10-CM

## 2023-09-05 LAB — POCT URINALYSIS DIPSTICK
Bilirubin, UA: NEGATIVE
Blood, UA: NEGATIVE
Glucose, UA: POSITIVE — AB
Ketones, UA: NEGATIVE
Leukocytes, UA: NEGATIVE
Nitrite, UA: NEGATIVE
Protein, UA: POSITIVE — AB
Spec Grav, UA: 1.015 (ref 1.010–1.025)
Urobilinogen, UA: 0.2 U/dL
pH, UA: 5.5 (ref 5.0–8.0)

## 2023-09-05 MED ORDER — METHYLENE BLUE 0.5 % INJ SOLN: 2.0000 mL | Freq: Once | INTRAVENOUS | Status: DC

## 2023-09-05 MED ORDER — ESTRADIOL 0.1 MG/GM VA CREA: TOPICAL_CREAM | VAGINAL | 3 refills | Status: AC

## 2023-09-05 NOTE — Assessment & Plan Note (Signed)
-   reports catheterization for 5 months managed by [facility] after spine surgery - passed void trial in the office  - ROI signed for records to review - catheterized for today, repeat at follow-up - discussed possible relationship with increased frequency/urgency or sensation of incomplete emptying - POCT UA + glucose and protein only

## 2023-09-05 NOTE — Assessment & Plan Note (Signed)
-   on aricept, managed by neurology - discussed impact of anesthesia on postoperative cognitive dysfunction - patient reports feeling overwhelmed by counseling and information, additional time required for visits due to limitations of positioning from pain and MCI -

## 2023-09-05 NOTE — Telephone Encounter (Signed)
Called patient to see how she's feeling after the weekend. Per Dr. Daiva Eves, patient has elevated eosinophil count. Called to assess for symptoms of eosinophilic pneumonia such as fever, shortness of breath, cough. Left HIPAA compliant voicemail requesting callback.   Sandie Ano, RN

## 2023-09-05 NOTE — Assessment & Plan Note (Addendum)
-   reports symptoms prior to stooling from vagina - history of diverticulosis - liquid stool noted on speculum exam, red dot noted left of vaginal apex with no clear stool extravasation or fistula tract. Noted at left upper vaginal fistula tract on prior exam. - discussed need for completion of lumbar discitis treatment and tobacco cessation decrease wound complications or sepsis with ICU admission due to history  - discussed additional imaging with CT abd/pelvis w/ and w/o triple contrast for surgical planning. Discussed colonoscopy due to clinical change and need to r/o IBD, malignancy and possible visualization of fistula tract - reviewed anatomy, discussed possible need to colorectal surgery referral if identified at apex - Treatment options include anti-diarrhea medication (loperamide/ Imodium OTC or prescription lomotil), fiber supplements, physical therapy, and possible sacral neuromodulation or surgery.   - encouraged titration of fiber supplementation to optimize stool consistency

## 2023-09-05 NOTE — Assessment & Plan Note (Signed)
For night time frequency: - avoid fluid intake 3 hours before bedtime

## 2023-09-05 NOTE — Assessment & Plan Note (Signed)
-   continue Nicoderm and tobacco cessation - discussed increased risk of wound complication, perioperative complications and VTE with tobacco use

## 2023-09-05 NOTE — Assessment & Plan Note (Addendum)
-   PICC line in place for Daptomycin due to suspected lumbar discitis with hardware due to pain, discussed need to postpone surgical intervention until after completion of spinal infection

## 2023-09-05 NOTE — Progress Notes (Unsigned)
Cardiology Office Note:  .   Date:  09/06/2023  ID:  [name], DOB 05/06/1956, MRN [id number] PCP: Aliene Beams, MD  [facility] Providers Cardiologist:  Donato Schultz, MD    History of Present Illness: .   [name] is a 68 y.o. female with history of VSD,  HTN, HLD, mild non obstructive CAD LHC 2016, chronic combined CHF EF 40-45% echo 08/2022, 45-50% 02/2023, ventricular tachycardia ablation.  Limited echo 08/2022 severe RV systolic function and severe enlargement, limed by mechanical ventilator. F/u echo 02/2023 small VSD, EF 45-50% RV normal.   Patient comes in with her daughter. She has osteomyelitis and is being treated with 2 new IV antibiotics. Being followed by ID. She's having dizziness and racing heart-feels like it's PVC's. On Daptomycin, Cefepime is on hold because of these symptoms and nausea. She drinks water constantly. Dr. Daiva Eves is requesting we do a CXR 2 view today. Smoking 4 cigarettes a day. Coughing green phlegm. She's very weak and walking with a walker. In a wheelchair today. While patient was sitting in the office she became very dizzy and fuzzy headed. BP on arrival 118/83. Recheck three times 69/49. She took all her meds before coming in. She's drinking water.   ROS:    Studies Reviewed: [name]         Prior CV Studies:   Echo 02/2023 IMPRESSIONS     1. Small perimembranous VSD with noted shunt on color flow doppler. Left  ventricular ejection fraction, by estimation, is 45 to 50%. The left  ventricle has mildly decreased function. The left ventricle has no  regional wall motion abnormalities. Left  ventricular diastolic parameters are indeterminate.   2. Right ventricular systolic function is normal. The right ventricular  size is normal.   3. Left atrial size was moderately dilated.   4. The mitral valve is normal in structure. Mild to moderate mitral valve  regurgitation. No evidence of mitral stenosis.   5. Tricuspid valve  regurgitation is mild to moderate.   6. The aortic valve is tricuspid. Aortic valve regurgitation is moderate.  Aortic valve sclerosis/calcification is present, without any evidence of  aortic stenosis. Aortic regurgitation PHT measures 353 msec.   7. There is mild dilatation of the ascending aorta, measuring 38 mm.  There is mild dilatation of the aortic root, measuring 38 mm.   8. The inferior vena cava is normal in size with greater than 50%  respiratory variability, suggesting right atrial pressure of 3 mmHg.    Risk Assessment/Calculations:             Physical Exam:   VS:  BP 118/80   Pulse 83   Ht 5\' 1"  (1.549 m)   Wt 133 lb 12.8 oz (60.7 kg)   SpO2 97%   BMI 25.28 kg/m    Wt Readings from Last 3 Encounters:  09/06/23 133 lb 12.8 oz (60.7 kg)  09/05/23 133 lb (60.3 kg)  08/18/23 139 lb 12.8 oz (63.4 kg)    GEN: Well nourished, well developed in no acute distress NECK: No JVD; No carotid bruits CARDIAC:  RRR, 2/6 systolic murmur RESPIRATORY:  upper  wheezing and rhonchi  ABDOMEN: Soft, non-tender, non-distended EXTREMITIES:  No edema; No deformity   ASSESSMENT AND PLAN: .    Hypotension-BP 69/49 very dizzy. Took all her meds this am and hasn't eaten anything. Giving IV fluids-500 ml, gingerale and crackers. BP up 83/56. Being treated by ID for osteomyelitis and many  side effects from the antibiotics including increase in palpitations. They are requesting 2 view CXR today but she's feeling too poorly to go and nurse coming to her home at 10:30 am to check her port. She is feeling better. Will hold pm losartan 25 mg until BP stabilizes then start back 12.5 mg daily. Patient in our office for 1 1/2 hrs receiving IV fluids.   Small perimembranous VSD on echo 02/2023 stable  NICM EF 45%-hypotensive and dehydrated today. Giving IV fluids. Will have to hold cozaar until BP comes up and then restart 12.5 mg daily. Daughter aware and they have BP cuff at home.  Ventricular  tachycardia ablation on carvedilol-increase in palpitations, PVC's may be related to hypotension, infection.   Non obstrctive CAD on LHC 2016  HTN now hypotensive-see above.  Tobacco abuse-down to 4 cigarettes daily. Trying to quit. Productive cough. ID requesting we do a 2 view CXR today.  DM2- glucose 130 this am.        Dispo: f/u 3 months or sooner if needed.  Signed, Jacolyn Reedy, PA-C

## 2023-09-05 NOTE — Patient Instructions (Addendum)
Accidental Bowel Leakage:  - Treatment options include anti-diarrhea medication (loperamide/ Imodium OTC or prescription lomotil), fiber supplements, physical therapy, and possible sacral neuromodulation or surgery.   Women should try to eat at least 21 to 25 grams of fiber a day, while men should aim for 30 to 38 grams a day. You can add fiber to your diet with food or a fiber supplement such as psyllium (metamucil), benefiber, or fibercon.   Here's a look at how much dietary fiber is found in some common foods. When buying packaged foods, check the Nutrition Facts label for fiber content. It can vary among brands.  Fruits Serving size Total fiber (grams)*  Raspberries 1 cup 8.0  Pear 1 medium 5.5  Apple, with skin 1 medium 4.5  Banana 1 medium 3.0  Orange 1 medium 3.0  Strawberries 1 cup 3.0   Vegetables Serving size Total fiber (grams)*  Green peas, boiled 1 cup 9.0  Broccoli, boiled 1 cup chopped 5.0  Turnip greens, boiled 1 cup 5.0  Brussels sprouts, boiled 1 cup 4.0  Potato, with skin, baked 1 medium 4.0  Sweet corn, boiled 1 cup 3.5  Cauliflower, raw 1 cup chopped 2.0  Carrot, raw 1 medium 1.5   Grains Serving size Total fiber (grams)*  Spaghetti, whole-wheat, cooked 1 cup 6.0  Barley, pearled, cooked 1 cup 6.0  Bran flakes 3/4 cup 5.5  Quinoa, cooked 1 cup 5.0  Oat bran muffin 1 medium 5.0  Oatmeal, instant, cooked 1 cup 5.0  Popcorn, air-popped 3 cups 3.5  Brown rice, cooked 1 cup 3.5  Bread, whole-wheat 1 slice 2.0  Bread, rye 1 slice 2.0   Legumes, nuts and seeds Serving size Total fiber (grams)*  Split peas, boiled 1 cup 16.0  Lentils, boiled 1 cup 15.5  Black beans, boiled 1 cup 15.0  Baked beans, canned 1 cup 10.0  Chia seeds 1 ounce 10.0  Almonds 1 ounce (23 nuts) 3.5  Pistachios 1 ounce (49 nuts) 3.0  Sunflower kernels 1 ounce 3.0  *Rounded to nearest 0.5 gram. Source: [employer] for [employer], Legacy Release       Continue tobacco cessation.   For vaginal atrophy (thinning of the vaginal tissue that can cause dryness and burning) and UTI prevention we discussed estrogen replacement in the form of vaginal cream.   Start vaginal estrogen therapy nightly for two weeks then 2 times weekly at night. This can be placed with your finger or an applicator inside the vagina and around the urethra.  Please let us know if the prescription is too expensive and we can look for alternative options.   Is vaginal estrogen therapy safe for me? Vaginal estrogen preparations act on the vaginal skin, and only a very tiny amount is absorbed into the bloodstream (0.01%).  They work in a similar way to hand or face cream.  There is minimal absorption and they are therefore perfectly safe. If you have had breast cancer and have persistent troublesome symptoms which aren't settling with vaginal moisturisers and lubricants, local estrogen treatment may be a possibility, but consultation with your oncologist should take place first.

## 2023-09-05 NOTE — Assessment & Plan Note (Signed)
-   discussed risk of wound breakdown due to prior poor wound healing  - discussed risk of worsening fecal leakage if failed repair - encouraged tobacco cessation and optimize glycemic control

## 2023-09-05 NOTE — Assessment & Plan Note (Signed)
-   For symptomatic vaginal atrophy options include lubrication with a water-based lubricant, personal hygiene measures and barrier protection against wetness, and estrogen replacement in the form of vaginal cream, vaginal tablets, or a time-released vaginal ring.   - Rx to start vaginal estrogen

## 2023-09-05 NOTE — Progress Notes (Signed)
New Patient Evaluation and Consultation  Referring Provider: Aliene Beams, MD PCP: Aliene Beams, MD Date of Service: 09/05/2023  SUBJECTIVE Chief Complaint: New Patient (Initial Visit) Marissa KitchenARAIYAH [name] is a 68 y.o. female here today for a rectovaginal fistula.)  History of Present Illness: [name] is a 68 y.o. White or Caucasian female seen in consultation at the request of Dr D'Iorio for evaluation of rectovaginal fistula.    Reports chronic diarrhea with imodium use since adolescence and bipolar disorder with new medication, pending stool sample collection with Cologuard for screening.  Black fecal matter in vagina started around 1/202 started with loose diarrhea, now increased volume of leakage with firm stool. Denies blood in stool. Changes 5 diaper/day due to thin line on diaper PICC line in place for Daptomycin due to suspected lumbar discitis with hardware due to pain, plan for 5 weeks of antibiotics on week 3. Held cefepime 09/01/23 when pt was feeling unwell with elevated BP, lightheadedness, nausea and tachycardia attributed to side effects from cefepime.  UTI symptoms: urge to valsalva, sensation of incomplete emptying, itching Denies hematuria, dysuria Denies hospitalization for kidney infection  H/o bladder tack by hysterectomy after 3 SVD by Dr. Ambrose Mantle (OBGYN) 35 years ago for pelvic organ prolapse without mesh use, denies complications Chronic pain on buprenorphine patch with flare of back pain, hydrocodone for flare of back pain, prior to 06/2023 on flexeril, and gabapentin.  Last imodium dose was 1 week ago Prior urinary retention for 5 months and started flomax managed by [facility] Hollice Espy . No longer using flomax Donepezil for memory issues, discontinued per Neurology.  Wheelchair for mobility Exam on 08/22/23 by Dr. Gwinda Passe with stool noted with small fistula tract noted in the "upper left of vagina" Smokes 4 cigarettes/day on Nicoderm   Review of  records significant for: T2DM on Jardiance and metformin, pt reports <7 HbA1C  - hx of scoliosis surgery with extensive lower thoracic and lumbar fusion roughly 57yrs ago - ground level fall 05/2022 resulted in L3 fracture s/p posterolateral instrumentation fusion on 06/26/22 complicated by wound dehiscence with exposed hardware and unstable lumbar spine fracture s/p revision of lumbar wound with removal of L1-3 4 and 5 lumbar screws and rod and removal of right L1 screw and revision of L1-L5 posterior instrumented fusion on 08/09/22 complicated by postoperative wound infection and ventriculitis and ICU admission. Discharged to CIR on vancomycin and cefepime through 3/14 for ventriculitis changed to doxcycyline plus levofloxacin and outpatient wound care.   Urinary Symptoms: Does not leak urine.   Day time voids 8.  Nocturia: 3 times per night to void. Drinks water during the night due to dry mouth Denies snoring, LE edema Denies lasix use Voiding dysfunction:  empties bladder well.  Patient does not use a catheter to empty bladder.  When urinating, patient feels difficulty starting urine stream Drinks: 17 x 5oz water per day,  1 can of diet coke   UTIs: 2 UTI's in the last year.   Denies history of blood in urine, kidney or bladder stones, pyelonephritis, bladder cancer, and kidney cancer No results found for the last 90 days.  Pelvic Organ Prolapse Symptoms:                  Patient Denies a feeling of a bulge the vaginal area.   Bowel Symptom: Bowel movements: 2 time(s) per day with history of chronic diarrhea since adulthood Reports fecal incontinence x 2 prior to 07/2023 in 2024 Stool consistency: loose  Straining: no.  Splinting: no.  Incomplete evacuation: no.  Patient Admits to accidental bowel leakage / fecal incontinence  Occurs: 5 time(s) per day  Consistency with leakage: soft , loose stool Bowel regimen:  imodium Last colonoscopy: Date  3 years ago at [facility], results not  available  Negative Cologuard 2022 [facility] Colonoscopy          Current Care Gaps     Colonoscopy (Every 10 Years) Never done   No completion history exists for this topic.                Sexual Function Sexually active: no.  Sexual orientation: Straight Pain with sex: No, dryness with intercourse  Pelvic Pain Denies pelvic pain  Past Medical History:  Past Medical History:  Diagnosis Date   Anemia    Anxiety    Arthritis    Bipolar disorder (HCC)    Cardiomyopathy (HCC)    Depression    GERD (gastroesophageal reflux disease)    Heart failure (HCC)    Heart murmur    "related to VSD"   High cholesterol    History of blood transfusion    "related to OR" (08/19/2016)   History of hiatal hernia    Hypertension    Hyperthyroidism    Mild cognitive impairment 09/06/2018   Paroxysmal ventricular tachycardia (HCC)    Tachycardia, unspecified    Type II diabetes mellitus (HCC)    UTI (urinary tract infection)    being treated with Keflex   Ventricular septal defect    Ventriculitis of brain due to bacteria 11/02/2022   Vertebral fracture, osteoporotic (HCC) 01/06/2023     Past Surgical History:   Past Surgical History:  Procedure Laterality Date   ABDOMINAL HYSTERECTOMY     BACK SURGERY     CARDIAC CATHETERIZATION N/A 06/23/2015   Procedure: Left Heart Cath and Coronary Angiography;  Surgeon: Laurey Morale, MD;  Location: [facility] INVASIVE CV LAB;  Service: Cardiovascular;  Laterality: N/A;   CARDIAC CATHETERIZATION  1960   "VSD was so small; didn't need repaired"   EXAM UNDER ANESTHESIA WITH MANIPULATION OF HIP Right 06/02/2014   dr Jerl Santos   FRACTURE SURGERY     HERNIA REPAIR     HIP CLOSED REDUCTION Right 06/02/2014   Procedure: CLOSED MANIPULATION HIP;  Surgeon: Velna Ochs, MD;  Location: [facility] OR;  Service: Orthopedics;  Laterality: Right;   IR FLUORO GUIDED NEEDLE PLC ASPIRATION/INJECTION LOC  08/09/2023   JOINT REPLACEMENT     JOINT REPLACEMENT      POSTERIOR LUMBAR FUSION 4 LEVEL N/A 06/26/2022   Procedure: Lumbar One To Lumbar Five Posterior Instrumented Fusion;  Surgeon: Jadene Pierini, MD;  Location: [facility] OR;  Service: Neurosurgery;  Laterality: N/A;   REFRACTIVE SURGERY Bilateral    SHOULDER ARTHROSCOPY Right    SHOULDER OPEN ROTATOR CUFF REPAIR Right    SPINAL FUSION  1996   "t10 down to my coccyx   SPINE HARDWARE REMOVAL     TOTAL ABDOMINAL HYSTERECTOMY     TOTAL HIP ARTHROPLASTY Right 05/10/2014   [email or web address]      by dr Cristal Deer olcott   TOTAL KNEE ARTHROPLASTY Left    TOTAL SHOULDER ARTHROPLASTY Left 08/19/2016   Procedure: TOTAL SHOULDER ARTHROPLASTY;  Surgeon: Jones Broom, MD;  Location: [facility] OR;  Service: Orthopedics;  Laterality: Left;  Left total shoulder replacement     Past OB/GYN History: OB History  Gravida Para Term Preterm AB Living  3 3 3  3  SAB IAB Ectopic Multiple Live Births      3    # Outcome Date GA Lbr Len/2nd Weight Sex Type Anes PTL Lv  3 Term     M Vag-Spont   LIV  2 Term     F Vag-Spont   LIV  1 Term     F Vag-Spont   LIV    Vaginal deliveries: 3, largest infant 10lb4oz. Denies 4th degree lacerations. Forceps/ Vacuum deliveries: 0, Cesarean section: 0 Menopausal: Yes, at age 61, Denies vaginal bleeding since menopause Contraception: s/p hysterectomy. Last pap smear was prior to hysterectomy.  Any history of abnormal pap smears: yes, repeat pap smears normal. Denies CKC or LEEP No results found for: "DIAGPAP", "HPVHIGH", "ADEQPAP"  Medications: Patient has a current medication list which includes the following prescription(s): acetaminophen, buprenorphine, bupropion, carvedilol, cefepime, cyclobenzaprine, daptomycin, diphenoxylate-atropine, donepezil, empagliflozin, [START ON 09/08/2023] estradiol, etodolac, famotidine, fluvoxamine, gabapentin, hydrocodone-acetaminophen, acidophilus, lamotrigine, levothyroxine, losartan, melatonin, metformin, multivitamin, nicotine, ondansetron,  potassium chloride, restasis multidose, rosuvastatin, sodium chloride, and trazodone.   Allergies: Patient is allergic to gallium (radioactive isotope) and paxlovid [nirmatrelvir-ritonavir].   Social History:  Social History   Tobacco Use   Smoking status: Every Day    Types: Cigarettes    Passive exposure: Never   Smokeless tobacco: Never   Tobacco comments:    03/30/21 smokes 2 cigs daily.      08/06/22 Shanda Bumps stated that the patient is not smoking while at [facility] for rehab. [facility]  Vaping Use   Vaping status: Never Used  Substance Use Topics   Alcohol use: No   Drug use: No    Relationship status: married Patient lives with her husband.   Patient is not employed. Regular exercise: No History of abuse: No  Family History:   Family History  Problem Relation Age of Onset   Other Mother        alive   Stroke Father 76       deceased   Dementia Father    Chorea Maternal Grandfather    Dementia Maternal Aunt    Dementia Maternal Aunt    Heart attack Other        multiple uncles have died with myocardial infarction   Bladder Cancer Neg Hx    Uterine cancer Neg Hx      Review of Systems: Review of Systems  Constitutional:  Positive for malaise/fatigue and weight loss. Negative for fever.  Respiratory:  Positive for shortness of breath. Negative for cough and wheezing.   Cardiovascular:  Positive for palpitations. Negative for chest pain and leg swelling.  Gastrointestinal:  Positive for diarrhea. Negative for abdominal pain and blood in stool.  Genitourinary:  Positive for frequency (night time). Negative for dysuria, hematuria and urgency.  Skin:  Negative for rash.  Neurological:  Negative for dizziness, weakness and headaches.  Endo/Heme/Allergies:  Bruises/bleeds easily.  Psychiatric/Behavioral:  Positive for depression. The patient is nervous/anxious.      OBJECTIVE Physical Exam: Vitals:   09/05/23 1256  BP: 122/83  Pulse: (!) 103  Weight: 133 lb  (60.3 kg)  Height: 5' 1.81" (1.57 m)    Physical Exam Constitutional:      General: She is not in acute distress.    Appearance: Normal appearance.  Genitourinary:     Bladder and urethral meatus normal.     No lesions in the vagina.     Genitourinary Comments: Liquid stool noted at introitus, gently cleaned with 4x4. Speculum exam  with light brown liquid stool at left vaginal apex removed with fox swabs. No clear fistula tract noted or active extravasation of stool with valsalva. Methylene blue added to lubricant during rectovaginal exam, no extravasation of blue lubricant noted vaginally with gentle massage of the rectovaginal septum.  A foley catheter was inserted into the rectum with foley balloon inflated to 10mL, normal saline 15mL applied to vaginal canal. No bubble was noted vaginally with introduction of gas via [place]y syringe attached to foley catheter. The foley balloon was deflated and removed without difficulty or discomfort. Exam limited by positioning due to h/o R hip surgery and back pain, in addition to high tone pelvic floor      Right Labia: rash.     Right Labia: No tenderness, lesions, skin changes or Bartholin's cyst.    Left Labia: rash.     Left Labia: No tenderness, lesions, skin changes or Bartholin's cyst.    Vulva exam comments: Diffuse, bilateral shallow ulcerations 2-58mm in diameter. Nontender..        No vaginal discharge, erythema, tenderness, bleeding, ulceration or granulation tissue.     Anterior and posterior vaginal prolapse present.    Moderate vaginal atrophy present.     Right Adnexa: not tender, not full and no mass present.    Left Adnexa: not tender, not full and no mass present.    Cervix is absent.        Uterus is absent.     Urethral meatus caruncle not present.    No urethral prolapse, tenderness, mass, hypermobility, discharge or stress urinary incontinence with cough stress test present.     Bladder is not tender, urgency on  palpation not present and masses not present.      Levator ani is tender (increased pelvic floor tone).     Obturator internus not tender, no asymmetrical contractions present and no pelvic spasms present.    Symmetrical pelvic sensation, anal wink present and BC reflex present. Rectum:     Tenderness present.     No rectal mass or rectovaginal septum nodularity.  Cardiovascular:     Rate and Rhythm: Normal rate.  Pulmonary:     Effort: Pulmonary effort is normal. No respiratory distress.  Abdominal:     General: There is no distension.     Palpations: Abdomen is soft. There is no mass.     Tenderness: There is no abdominal tenderness.     Hernia: No hernia is present.  Neurological:     Mental Status: She is alert.     Gait: Gait abnormal (wheelchair).  Vitals reviewed. Exam conducted with a chaperone present.    POP-Q:   POP-Q  -2                                            Aa   -2                                           Ba  -6                                              C  1                                            Gh  3                                            Pb  6                                            tvl   -2                                            Ap  -2                                            Bp                                                 D    Rectal Exam:  Normal sphincter tone, small distal rectocele, enterocoele not present, no rectal masses, no sign of dyssynergia when asking the patient to bear down.   Straight Catheterization Procedure for PVR: After verbal consent was obtained from the patient for catheterization to assess bladder emptying and residual volume the urethra and surrounding tissues were prepped with betadine and an in and out catheterization was performed.  PVR was .  Urine appeared dark yellow. The patient tolerated the procedure well.  Laboratory Results: Lab Results  Component Value Date   COLORU  Yellow 09/05/2023   CLARITYU Clear 09/05/2023   GLUCOSEUR Positive (A) 09/05/2023   BILIRUBINUR Negative 09/05/2023   KETONESU Negative 09/05/2023   SPECGRAV 1.015 09/05/2023   RBCUR Negative 09/05/2023   PHUR 5.5 09/05/2023   PROTEINUR Positive (A) 09/05/2023   UROBILINOGEN 0.2 09/05/2023   LEUKOCYTESUR Negative 09/05/2023    Lab Results  Component Value Date   CREATININE 0.52 08/10/2023   CREATININE 0.66 03/09/2023   CREATININE 0.53 01/06/2023    Lab Results  Component Value Date   HGBA1C 6.0 (H) 07/08/2022    Lab Results  Component Value Date   HGB 11.3 (L) 08/10/2023     ASSESSMENT AND PLAN Ms. Hemrick is a 68 y.o. with:  1. Nocturia   2. Vaginal atrophy   3. History of urinary retention   4. Incontinence of feces, unspecified fecal incontinence type   5. Vulvar ulcer   6. Hardware complicating wound infection, sequela   7. Tobacco use   8. Mild cognitive impairment     Nocturia Assessment & Plan: For night time frequency: - avoid fluid intake 3 hours before bedtime   Orders: -     POCT urinalysis dipstick  Vaginal atrophy Assessment & Plan: - For symptomatic vaginal atrophy options include lubrication with a water-based lubricant, personal hygiene measures  and barrier protection against wetness, and estrogen replacement in the form of vaginal cream, vaginal tablets, or a time-released vaginal ring.   - Rx to start vaginal estrogen  Orders: -     Estradiol; Place 0.5g nightly for two weeks then twice a week after  Dispense: 30 g; Refill: 3  History of urinary retention Assessment & Plan: - reports catheterization for 5 months managed by [facility] after spine surgery - passed void trial in the office  - ROI signed for records to review - catheterized for today, repeat at follow-up - discussed possible relationship with increased frequency/urgency or sensation of incomplete emptying - POCT UA + glucose and protein  only   Incontinence of feces, unspecified fecal incontinence type Assessment & Plan: - reports symptoms prior to stooling from vagina - history of diverticulosis - liquid stool noted on speculum exam, red dot noted left of vaginal apex with no clear stool extravasation or fistula tract. Noted at left upper vaginal fistula tract on prior exam. - discussed need for completion of lumbar discitis treatment and tobacco cessation decrease wound complications or sepsis with ICU admission due to history  - discussed additional imaging with CT abd/pelvis w/ and w/o triple contrast for surgical planning. Discussed colonoscopy due to clinical change and need to r/o IBD, malignancy and possible visualization of fistula tract - reviewed anatomy, discussed possible need to colorectal surgery referral if identified at apex - Treatment options include anti-diarrhea medication (loperamide/ Imodium OTC or prescription lomotil), fiber supplements, physical therapy, and possible sacral neuromodulation or surgery.   - encouraged titration of fiber supplementation to optimize stool consistency  Orders: -     CT ABDOMEN PELVIS W WO CONTRAST; Future  Vulvar ulcer Assessment & Plan: - diffused, bilateral, non-tender 2-43mm ulcers - denies history of HSV - pending HSV swab  Orders: -     Cervicovaginal ancillary only  Hardware complicating wound infection, sequela Assessment & Plan: - PICC line in place for Daptomycin due to suspected lumbar discitis with hardware due to pain, discussed need to postpone surgical intervention until after completion of spinal infection   Tobacco use Assessment & Plan: - continue Nicoderm and tobacco cessation - discussed increased risk of wound complication, perioperative complications and VTE with tobacco use   Mild cognitive impairment Assessment & Plan: - on aricept, managed by neurology - discussed impact of anesthesia on postoperative cognitive dysfunction - patient  reports feeling overwhelmed by counseling and information, additional time required for visits due to limitations of positioning from pain and MCI -    Time spent: I spent 95 minutes dedicated to the care of this patient on the date of this encounter to include pre-visit review of records, face-to-face time with the patient discussing fecal incontinence and possible rectovaginal fistula, tobacco use, history of poor wound healing, MCI, vulvar ulceration, history of urinary retention, nocturia, and post visit documentation and ordering medication/ testing.   Loleta Chance, MD

## 2023-09-05 NOTE — Assessment & Plan Note (Signed)
-   diffused, bilateral, non-tender 2-47mm ulcers - denies history of HSV - pending HSV swab

## 2023-09-06 ENCOUNTER — Encounter: Payer: Self-pay | Admitting: Infectious Disease

## 2023-09-06 ENCOUNTER — Other Ambulatory Visit (HOSPITAL_COMMUNITY)
Admission: RE | Admit: 2023-09-06 | Discharge: 2023-09-06 | Disposition: A | Discharge status: Home / Self Care | Payer: Medicare Other | Source: Other Acute Inpatient Hospital | Attending: Obstetrics | Admitting: Obstetrics

## 2023-09-06 ENCOUNTER — Encounter: Payer: Self-pay | Admitting: Physician Assistant

## 2023-09-06 ENCOUNTER — Ambulatory Visit: Payer: Medicare Other | Attending: Physician Assistant | Admitting: Physician Assistant

## 2023-09-06 VITALS — BP 118/80 | HR 83 | Ht 61.0 in | Wt 133.8 lb

## 2023-09-06 DIAGNOSIS — I2510 Atherosclerotic heart disease of native coronary artery without angina pectoris: Secondary | ICD-10-CM | POA: Diagnosis not present

## 2023-09-06 DIAGNOSIS — I10 Essential (primary) hypertension: Secondary | ICD-10-CM

## 2023-09-06 DIAGNOSIS — N766 Ulceration of vulva: Secondary | ICD-10-CM | POA: Diagnosis present

## 2023-09-06 DIAGNOSIS — E861 Hypovolemia: Secondary | ICD-10-CM | POA: Diagnosis not present

## 2023-09-06 DIAGNOSIS — I428 Other cardiomyopathies: Secondary | ICD-10-CM | POA: Diagnosis not present

## 2023-09-06 DIAGNOSIS — I959 Hypotension, unspecified: Secondary | ICD-10-CM | POA: Insufficient documentation

## 2023-09-06 DIAGNOSIS — E119 Type 2 diabetes mellitus without complications: Secondary | ICD-10-CM

## 2023-09-06 DIAGNOSIS — Q210 Ventricular septal defect: Secondary | ICD-10-CM | POA: Diagnosis not present

## 2023-09-06 DIAGNOSIS — N823 Fistula of vagina to large intestine: Secondary | ICD-10-CM | POA: Insufficient documentation

## 2023-09-06 DIAGNOSIS — Z720 Tobacco use: Secondary | ICD-10-CM

## 2023-09-06 HISTORY — DX: Fistula of vagina to large intestine: N82.3

## 2023-09-06 HISTORY — DX: Hypotension, unspecified: I95.9

## 2023-09-06 LAB — LAB REPORT - SCANNED: EGFR: 99

## 2023-09-06 MED ORDER — SODIUM CHLORIDE 0.9 % IV SOLN: INTRAVENOUS | Status: AC

## 2023-09-06 MED ORDER — LOSARTAN POTASSIUM 25 MG PO TABS: 12.5000 mg | ORAL_TABLET | Freq: Every day | ORAL | Status: AC

## 2023-09-06 MED ORDER — SODIUM CHLORIDE (PF) 0.9 % IJ SOLN: 500.0000 mL | Freq: Once | INTRAMUSCULAR | Status: DC

## 2023-09-06 NOTE — Patient Instructions (Signed)
Medication Instructions:  Your physician has recommended you make the following change in your medication:   HOLD the Losartan for now.  When you are able to start it back, start it back at 25 mg taking 1/2 tablet   *If you need a refill on your cardiac medications before your next appointment, please call your pharmacy*   Lab Work: None ordered  If you have labs (blood work) drawn today and your tests are completely normal, you will receive your results only by: MyChart Message (if you have MyChart) OR A paper copy in the mail If you have any lab test that is abnormal or we need to change your treatment, we will call you to review the results.   Testing/Procedures: None ordered   Follow-Up: At [facility], you and your health needs are our priority.  As part of our continuing mission to provide you with exceptional heart care, we have created designated Provider Care Teams.  These Care Teams include your primary Cardiologist (physician) and Advanced Practice Providers (APPs -  Physician Assistants and Nurse Practitioners) who all work together to provide you with the care you need, when you need it.  We recommend signing up for the patient portal called "MyChart".  Sign up information is provided on this After Visit Summary.  MyChart is used to connect with patients for Virtual Visits (Telemedicine).  Patients are able to view lab/test results, encounter notes, upcoming appointments, etc.  Non-urgent messages can be sent to your provider as well.   To learn more about what you can do with MyChart, go to [email or web address].    Your next appointment:   2 month(s)  Provider:   Donato Schultz, MD     Other Instructions     1st Floor: - Lobby - Registration  - Pharmacy  - Lab - Cafe  2nd Floor: - PV Lab - Diagnostic Testing (echo, CT, nuclear med)  3rd Floor: - Vacant  4th Floor: - TCTS (cardiothoracic surgery) - AFib Clinic - Structural Heart Clinic -  Vascular Surgery  - Vascular Ultrasound  5th Floor: - [facility] (general and EP) - Clinical Pharmacy for coumadin, hypertension, lipid, weight-loss medications, and med management appointments    Valet parking services will be available as well.

## 2023-09-06 NOTE — Progress Notes (Unsigned)
Subjective:  Chief complaint:    Patient ID: [name], female    DOB: [birth year], 68 y.o.   MRN: [id number]  HPI   68 y.o. female with hx of scoliosis surgery with extensive lower thoracic and lumbar fusion roughly 35yrs ago, she suffered ground level fall in late November, where she fractures through all three columns of L3, thus went to OR on 12/9 for stabilization, new  posterolateral instrumentation fusion on June 26, 2022 complicated by Wound dehiscence with exposed hardware and unstable lumbar spine fracture status post revision of lumbar wound with removal of L1-3 4 and 5 lumbar screws and rod and removal of right L1 screw and revision of L1-L5 posterior instrumented fusion on August 09, 2022 and now concern for postoperative wound infection and ventriculitis. She had complicated protracted course in the icu, but eventually was discharged to CIR on vancomycin and cefepime through 3/14 for ventriculitis and hw complicating wound infection. Now changed to doxcycyline plus levofloxacin as of 3/15 while she continued to need medihoney with moistened quaze with sliver hydrofiber to the caudal portion of wound.   Mri of brain on 09/29/22- resolution of ventriculitis.   She was seen by my partner Dr. Drue Second while she was in the inpatient rehab unit.  Follow-up was rescheduled for May 2024.  Interim history:  "Back pain did not appear to be worse but wound did persist and apparently tunneled cranially.  Her white blood cell count with [facility] was in the 16,000 range an abrupt change from her last labs as an inpatient when it was in the 7000 range.  He did have a mechanical fall and fell on her left knee where she has a prosthetic joint.  When I last saw her we rechecked her inflammatory markers and sed rate had titrated down though CRP was going up to 15.6  WBC recheck with our lab was 12,000.  In the interim she has been seen by Dr. Johnsie Cancel with neurosurgery  and CT of the thoracic and lumbar spine were performed.  These have shown: A new T10 compression fracture with 12% loss of height with unchanged change adjacent chronic T10-T11 T12-L1 posterior element ankylosis with chronic nonfusion at T11-T12, L3 superior endplate nonacute fracture and subtle horizontal fracture of the chronic posterior element of the bone, difficult exclude nondisplaced S3-4 sacral fracture with mild presacral stranding.   Checked labs last time we saw her white blood cell count remained elevated 12,000.  Sed rate and CRP S[place] sed rate actually normalized and CRP had gone up.  Changed her evofloxacin and doxycycline to cefdinir and doxycycline.  She is continuing to take these.  She did develop a painful erythematous rash on her hands due to doxycycline use in the context of sun exposure at the beach.  This is subsequently resolved she has a new somewhat purpuric rash on her right arm that came up in the last few days " She returns to clinic today for follow-up  Discussed the use of AI scribe software for clinical note transcription with the patient, who gave verbal consent to proceed.  At her last visit with me on July 14, 2023 the patient had developed worsening pain in the thoracic spine and hip.  She was also with reduced strength and mobility with having to to now use a walker compared to a cane.  The patient had an MRI of the thoracic and lumbar spine pending after an initial scan done with [facility] had suggested potential  infection in the spine.  She also has been found to have a 8 mm solid mass in the right lung on CT scan.  The MRI was ultimately read and showed  IMPRESSION: 1. Fluid signal within the L3-L4 and L4-L5 discs is suspicious for discitis-osteomyelitis. Progressed bone marrow edema within the L4 vertebral body and bilateral pedicles. There is also increased bone marrow edema at the superior endplate of L5 and right L5  pedicle. 2. Possible subacute superior endplate fractures of the L4 and L5 vertebral bodies without associated height loss. 3. Minimal residual marrow edema associated with L3 superior endplate fracture. 4. 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  The case was discussed with Dr. Jake Samples and we both agreed that it would be prudent to have the patient's stop antibiotics and pursue IR guided biopsy for culture after she had been off antibiotics for several weeks.  I called the patient and her daughter on the 26th in the evening and asked that she stop antibiotics so that we could get a culture after she had been off antibiotics for several weeks.  She did not stop antibiotics that night.  In the interim she did have worsening of her back pain she underwent IR guided biopsy with culture yesterday with Gram stain having been negative no organisms isolated yet to date.  With regards to the her lung nodule we have done lab work here including a cryptococcal antigen in the serum which is negative urine histoplasma antigen and Blastomyces antigen.  She has been seen by Dr. Delton Coombes who noted that there was a pneumonia in the area where the lung nodule was seen and he wondered if this finding read as a nodule that might instead represent scarring.  He wanted to pursue further imaging with a PET scan before considering bronchoscopic be with BAL and biopsies.  At her visit with me on December 26th, 2024 patient had worsening pain in an thoracic and lumbar spine after initial scan with [facility] had suggested potential infection in the spine.  She was then found to have an 8 mm solid mass in the right lung on CT scan MRI was ultimately read and showed fluid signal within the L3-L4 and L4-L5 disc suspicious for discitis osteomyelitis with progressed bone marrow edema in the L4 vertebral body and bilateral pedicles also with increased bone marrow edema in the superior endplate of L5  and right L5 pedicle with possible versus subacute.  Endplate fractures in L4 and L5 with minimal residual bone marrow associated L3 superior endplate fracture with chronic findings of arachnoiditis in the lower lumbar spine.  I called the patient and her daughter on the 73 and had them stop antibiotics with plans for IR guided biopsy for culture.  This was accomplished after they had held antibiotics for several weeks.  Cultures were unrevealing.  We subsequently started her on yet another empiric regimen of oral doxycycline and cefadroxil until we could get a line placed at that point in time we placed her on IV daptomycin and cefepime.  In the interim she had complained of vaginal discharge though now I have found out that she has a rectovaginal fistula which could certainly explain that problem.  She then began to complain of malaise with labile blood pressures and lightheadedness nauseousness with tachycardia.  The patient thought there is due to side effects of IV antibiotics and we had her hold IV cefepime.  This was held over the weekend.  She was subsequent  seen by cardiology and found to be hypotensive.  In the interim she had labs done with home health which showed slight eosinophilia.  Her blood pressure as mentioned was quite low with cardiology but responded to IV fluids              Past Medical History:  Diagnosis Date  . Anemia   . Anxiety   . Arthritis   . Bipolar disorder (HCC)   . Cardiomyopathy (HCC)   . Depression   . GERD (gastroesophageal reflux disease)   . Heart failure (HCC)   . Heart murmur    "related to VSD"  . High cholesterol   . History of blood transfusion    "related to OR" (08/19/2016)  . History of hiatal hernia   . Hypertension   . Hyperthyroidism   . Hypotension 09/06/2023  . Mild cognitive impairment 09/06/2018  . Paroxysmal ventricular tachycardia (HCC)   . Rectovaginal fistula 09/06/2023  . Tachycardia, unspecified   . Type II  diabetes mellitus (HCC)   . UTI (urinary tract infection)    being treated with Keflex  . Ventricular septal defect   . Ventriculitis of brain due to bacteria 11/02/2022  . Vertebral fracture, osteoporotic (HCC) 01/06/2023    Past Surgical History:  Procedure Laterality Date  . ABDOMINAL HYSTERECTOMY    . BACK SURGERY    . CARDIAC CATHETERIZATION N/A 06/23/2015   Procedure: Left Heart Cath and Coronary Angiography;  Surgeon: Laurey Morale, MD;  Location: [facility] INVASIVE CV LAB;  Service: Cardiovascular;  Laterality: N/A;  . CARDIAC CATHETERIZATION  1960   "VSD was so small; didn't need repaired"  . EXAM UNDER ANESTHESIA WITH MANIPULATION OF HIP Right 06/02/2014   dr Jerl Santos  . FRACTURE SURGERY    . HERNIA REPAIR    . HIP CLOSED REDUCTION Right 06/02/2014   Procedure: CLOSED MANIPULATION HIP;  Surgeon: Velna Ochs, MD;  Location: [facility] OR;  Service: Orthopedics;  Laterality: Right;  . IR FLUORO GUIDED NEEDLE PLC ASPIRATION/INJECTION LOC  08/09/2023  . JOINT REPLACEMENT    . JOINT REPLACEMENT    . POSTERIOR LUMBAR FUSION 4 LEVEL N/A 06/26/2022   Procedure: Lumbar One To Lumbar Five Posterior Instrumented Fusion;  Surgeon: Jadene Pierini, MD;  Location: [facility] OR;  Service: Neurosurgery;  Laterality: N/A;  . REFRACTIVE SURGERY Bilateral   . SHOULDER ARTHROSCOPY Right   . SHOULDER OPEN ROTATOR CUFF REPAIR Right   . SPINAL FUSION  1996   "t10 down to my coccyx  . SPINE HARDWARE REMOVAL    . TOTAL ABDOMINAL HYSTERECTOMY    . TOTAL HIP ARTHROPLASTY Right 05/10/2014   [place]      by dr Carola Rhine  . TOTAL KNEE ARTHROPLASTY Left   . TOTAL SHOULDER ARTHROPLASTY Left 08/19/2016   Procedure: TOTAL SHOULDER ARTHROPLASTY;  Surgeon: Jones Broom, MD;  Location: [facility] OR;  Service: Orthopedics;  Laterality: Left;  Left total shoulder replacement    Family History  Problem Relation Age of Onset  . Other Mother        alive  . Stroke Father 40       deceased  . Dementia  Father   . Chorea Maternal Grandfather   . Dementia Maternal Aunt   . Dementia Maternal Aunt   . Heart attack Other        multiple uncles have died with myocardial infarction  . Bladder Cancer Neg Hx   . Uterine cancer Neg Hx  Social History   Socioeconomic History  . Marital status: Married    Spouse name: Not on file  . Number of children: Not on file  . Years of education: Not on file  . Highest education level: Master's degree (e.g., MA, MS, MEng, MEd, MSW, MBA)  Occupational History  . Occupation: Magazine features editor: SELF-EMPLOYED    Comment: former  Tobacco Use  . Smoking status: Every Day    Types: Cigarettes    Passive exposure: Never  . Smokeless tobacco: Never  . Tobacco comments:    03/30/21 smokes 2 cigs daily.      08/06/22 Shanda Bumps stated that the patient is not smoking while at [facility] for rehab. [facility]  Vaping Use  . Vaping status: Never Used  Substance and Sexual Activity  . Alcohol use: No  . Drug use: No  . Sexual activity: Not Currently    Partners: Male    Birth control/protection: Surgical    Comment: Hysterectomy  Other Topics Concern  . Not on file  Social History Narrative   Right handed   Caffeine 2-3 cups daily    Lives at home with husband    Social Drivers of Health   Financial Resource Strain: Low Risk  (07/08/2023)   Received from [facility]   Overall Financial Resource Strain (CARDIA)   . Difficulty of Paying Living Expenses: Not hard at all  Food Insecurity: No Food Insecurity (07/08/2023)   Received from [facility]   Hunger Vital Sign   . Worried About Programme researcher, broadcasting/film/video in the Last Year: Never true   . Ran Out of Food in the Last Year: Never true  Transportation Needs: No Transportation Needs (07/08/2023)   Received from [facility]   [facility] - Transportation   . In the past 12 months, has lack of transportation kept you from medical appointments or from  getting medications?: No   . Lack of Transportation (Non-Medical): No  Physical Activity: Not on file  Stress: Not on file  Social Connections: Not on file    Allergies  Allergen Reactions  . Gallium (Radioactive Isotope) Swelling    Pt states she can not have radioactive injections.   Kennieth Rad [Nirmatrelvir-Ritonavir]      Current Outpatient Medications:  .  acetaminophen (TYLENOL) 325 MG tablet, Take 1-2 tablets (325-650 mg total) by mouth every 6 (six) hours as needed for mild pain or headache., Disp: , Rfl:  .  ascorbic acid (VITAMIN C) 500 MG tablet, Take 500 mg by mouth daily., Disp: , Rfl:  .  ASPIRIN 81 PO, Take 1 tablet by mouth daily., Disp: , Rfl:  .  buprenorphine (BUTRANS) 10 MCG/HR PTWK, Place onto the skin., Disp: , Rfl:  .  buPROPion (WELLBUTRIN XL) 150 MG 24 hr tablet, Take 1 tablet (150 mg total) by mouth daily., Disp: 30 tablet, Rfl: 0 .  carvedilol (COREG) 25 MG tablet, Take 1 tablet (25 mg total) by mouth 2 (two) times daily with a meal., Disp: 60 tablet, Rfl: 0 .  ceFEPime (MAXIPIME) IVPB, Inject 500 mg into the vein every 12 (twelve) hours., Disp: , Rfl:  .  Cholecalciferol (VITAMIN D3) 50 MCG (2000 UT) capsule, Take 2,000 Units by mouth daily., Disp: , Rfl:  .  cyclobenzaprine (FLEXERIL) 10 MG tablet, Take 10 mg by mouth 3 (three) times daily., Disp: , Rfl:  .  daptomycin (CUBICIN) IVPB, Inject 500 mg into the vein daily.,  Disp: , Rfl:  .  diphenoxylate-atropine (LOMOTIL) 2.5-0.025 MG tablet, Take 1 tablet by mouth 2 (two) times daily as needed for diarrhea or loose stools., Disp: 30 tablet, Rfl: 0 .  donepezil (ARICEPT) 10 MG tablet, Take 10 mg by mouth every morning., Disp: , Rfl:  .  empagliflozin (JARDIANCE) 10 MG TABS tablet, Take 1 tablet (10 mg total) by mouth daily before breakfast., Disp: 90 tablet, Rfl: 3 .  [START ON 09/08/2023] estradiol (ESTRACE) 0.1 MG/GM vaginal cream, Place 0.5g nightly for two weeks then twice a week after, Disp: 30 g, Rfl: 3 .   etodolac (LODINE) 400 MG tablet, Take 400 mg by mouth every 8 (eight) hours as needed., Disp: , Rfl:  .  famotidine (PEPCID) 20 MG tablet, Take 1 tablet (20 mg total) by mouth 2 (two) times daily., Disp: 60 tablet, Rfl: 0 .  fenoprofen (NALFON) 600 MG TABS tablet, Take 600 mg by mouth in the morning and at bedtime., Disp: , Rfl:  .  fluvoxaMINE (LUVOX) 100 MG tablet, Take 1 tablet (100 mg total) by mouth at bedtime., Disp: 30 tablet, Rfl: 0 .  gabapentin (NEURONTIN) 100 MG capsule, Take 1 capsule (100 mg total) by mouth 3 (three) times daily., Disp: 90 capsule, Rfl: 0 .  HYDROcodone-acetaminophen (NORCO/VICODIN) 5-325 MG tablet, Take 1 tablet by mouth every 4 (four) hours as needed for moderate pain (pain score 4-6)., Disp: , Rfl:  .  Lactobacillus (ACIDOPHILUS) 100 MG CAPS, Take 1 capsule (100 mg total) by mouth 2 (two) times daily., Disp: 60 capsule, Rfl: 0 .  lamoTRIgine (LAMICTAL) 100 MG tablet, Take 200 mg by mouth at bedtime., Disp: , Rfl:  .  levothyroxine (SYNTHROID) 88 MCG tablet, Take 1 tablet (88 mcg total) by mouth daily before breakfast., Disp: 30 tablet, Rfl: 0 .  losartan (COZAAR) 25 MG tablet, Take 0.5 tablets (12.5 mg total) by mouth daily., Disp: , Rfl:  .  melatonin 5 MG TABS, Take 1 tablet (5 mg total) by mouth at bedtime as needed., Disp: 30 tablet, Rfl: 0 .  metFORMIN (GLUCOPHAGE) 500 MG tablet, Take 1 tablet (500 mg total) by mouth 2 (two) times daily with a meal., Disp: 60 tablet, Rfl: 0 .  Multiple Vitamin (MULTIVITAMIN) tablet, Take 1 tablet by mouth daily.  , Disp: , Rfl:  .  Multiple Vitamins-Minerals (ZINC PO), Take 50 mg by mouth daily., Disp: , Rfl:  .  nicotine (NICODERM CQ - DOSED IN MG/24 HR) 7 mg/24hr patch, Place 1 patch (7 mg total) onto the skin daily., Disp: 28 patch, Rfl: 0 .  ondansetron (ZOFRAN) 4 MG tablet, Take 1 tablet (4 mg total) by mouth every 8 (eight) hours as needed for refractory nausea / vomiting., Disp: 20 tablet, Rfl: 0 .  potassium chloride  (KLOR-CON) 10 MEQ tablet, Take 30 mEq by mouth 2 (two) times daily., Disp: , Rfl:  .  RESTASIS MULTIDOSE 0.05 % ophthalmic emulsion, 1 drop 2 (two) times daily., Disp: , Rfl:  .  rosuvastatin (CRESTOR) 20 MG tablet, 1 tablet by mouth Once a day for 30 days, Disp: , Rfl:  .  sodium chloride 1 g tablet, Take 1 tablet (1 g total) by mouth 2 (two) times daily with a meal., Disp: 60 tablet, Rfl: 0 .  traZODone (DESYREL) 100 MG tablet, Take 1 tablet (100 mg total) by mouth at bedtime., Disp: 30 tablet, Rfl: 0  Current Facility-Administered Medications:  .  0.9 %  sodium chloride infusion, , Intravenous, Continuous, Lenze,  Tarri Abernethy, PA-C, Last Rate: 500 mL/hr at 09/06/23 0915, New Bag at 09/06/23 0915   Review of Systems      Objective:   Physical Exam        Assessment & Plan:   Assessment and Plan           Lung nodule: PET scan today and if abnormal bronchoscopy with BAL Histo,  blasto, crypto ag's negative Cocci ab negative  Note I conveyed the above plan to Dr. Clyda Greener with Neurosurgery  I have personally spent 40 minutes involved in face-to-face and non-face-to-face activities for this patient on the day of the visit. Professional time spent includes the following activities: Preparing to see the patient (review of tests), Obtaining and/or reviewing separately obtained history (admission/discharge record), Performing a medically appropriate examination and/or evaluation , Ordering medications/tests/procedures, referring and communicating with other health care professionals, Documenting clinical information in the EMR, Independently interpreting results (not separately reported), Communicating results to the patient/family/caregiver, Counseling and educating the patient/family/caregiver and Care coordination (not separately reported).

## 2023-09-07 ENCOUNTER — Encounter: Payer: Self-pay | Admitting: Infectious Disease

## 2023-09-07 ENCOUNTER — Other Ambulatory Visit: Payer: Self-pay

## 2023-09-07 ENCOUNTER — Ambulatory Visit (INDEPENDENT_AMBULATORY_CARE_PROVIDER_SITE_OTHER): Payer: Medicare Other | Admitting: Infectious Disease

## 2023-09-07 ENCOUNTER — Telehealth: Payer: Self-pay

## 2023-09-07 VITALS — Resp 17 | Ht 61.0 in | Wt 133.3 lb

## 2023-09-07 DIAGNOSIS — N823 Fistula of vagina to large intestine: Secondary | ICD-10-CM | POA: Diagnosis not present

## 2023-09-07 DIAGNOSIS — R5381 Other malaise: Secondary | ICD-10-CM

## 2023-09-07 DIAGNOSIS — R110 Nausea: Secondary | ICD-10-CM

## 2023-09-07 DIAGNOSIS — I9589 Other hypotension: Secondary | ICD-10-CM

## 2023-09-07 DIAGNOSIS — I5042 Chronic combined systolic (congestive) and diastolic (congestive) heart failure: Secondary | ICD-10-CM

## 2023-09-07 DIAGNOSIS — S32009G Unspecified fracture of unspecified lumbar vertebra, subsequent encounter for fracture with delayed healing: Secondary | ICD-10-CM

## 2023-09-07 DIAGNOSIS — R002 Palpitations: Secondary | ICD-10-CM | POA: Diagnosis not present

## 2023-09-07 DIAGNOSIS — R42 Dizziness and giddiness: Secondary | ICD-10-CM | POA: Diagnosis not present

## 2023-09-07 DIAGNOSIS — M4626 Osteomyelitis of vertebra, lumbar region: Secondary | ICD-10-CM

## 2023-09-07 DIAGNOSIS — K5790 Diverticulosis of intestine, part unspecified, without perforation or abscess without bleeding: Secondary | ICD-10-CM

## 2023-09-07 DIAGNOSIS — I951 Orthostatic hypotension: Secondary | ICD-10-CM

## 2023-09-07 DIAGNOSIS — T847XXS Infection and inflammatory reaction due to other internal orthopedic prosthetic devices, implants and grafts, sequela: Secondary | ICD-10-CM

## 2023-09-07 NOTE — Telephone Encounter (Signed)
Notified [facility] that patient will go back on the Cefepime and continue Dapto as originally planned.

## 2023-09-08 ENCOUNTER — Telehealth: Payer: Self-pay

## 2023-09-08 LAB — HSV CULTURE AND TYPING

## 2023-09-08 NOTE — Telephone Encounter (Signed)
Called for peer-to-peer review for coverage of CT.  Approved 2/20-3/21/25. Authorization number [id number]

## 2023-09-08 NOTE — Telephone Encounter (Signed)
Phone number to call [phone]

## 2023-09-08 NOTE — Telephone Encounter (Signed)
Called and attempted a PA for a CT abd/pelvis scheduled for 09-09-2023.this has been denied and needs a peer to peer from a medical provider.   Reference number is [id number].

## 2023-09-09 ENCOUNTER — Encounter: Payer: Self-pay | Admitting: Obstetrics

## 2023-09-09 ENCOUNTER — Ambulatory Visit (HOSPITAL_COMMUNITY)
Admission: RE | Admit: 2023-09-09 | Discharge: 2023-09-09 | Disposition: A | Discharge status: Home / Self Care | Payer: Medicare Other | Source: Ambulatory Visit | Attending: Obstetrics | Admitting: Obstetrics

## 2023-09-09 ENCOUNTER — Ambulatory Visit (HOSPITAL_COMMUNITY): Payer: Medicare Other

## 2023-09-09 DIAGNOSIS — R159 Full incontinence of feces: Secondary | ICD-10-CM | POA: Insufficient documentation

## 2023-09-09 DIAGNOSIS — K5730 Diverticulosis of large intestine without perforation or abscess without bleeding: Secondary | ICD-10-CM | POA: Diagnosis not present

## 2023-09-09 DIAGNOSIS — Z96643 Presence of artificial hip joint, bilateral: Secondary | ICD-10-CM | POA: Insufficient documentation

## 2023-09-09 DIAGNOSIS — R918 Other nonspecific abnormal finding of lung field: Secondary | ICD-10-CM | POA: Insufficient documentation

## 2023-09-09 MED ORDER — IOHEXOL 300 MG/ML  SOLN
30.0000 mL | Freq: Once | INTRAMUSCULAR | Status: AC | PRN
  Administered 2023-09-09: 30 mL

## 2023-09-09 MED ORDER — IOHEXOL 300 MG/ML  SOLN
100.0000 mL | Freq: Once | INTRAMUSCULAR | Status: AC | PRN
  Administered 2023-09-09: 100 mL via INTRAVENOUS

## 2023-09-12 ENCOUNTER — Encounter: Payer: Self-pay | Admitting: Infectious Disease

## 2023-09-13 LAB — LAB REPORT - SCANNED: EGFR: 101

## 2023-09-15 ENCOUNTER — Ambulatory Visit: Payer: Medicare Other | Admitting: Family

## 2023-09-16 ENCOUNTER — Ambulatory Visit: Payer: Medicare Other | Admitting: Cardiology

## 2023-09-16 NOTE — Telephone Encounter (Signed)
 Pt was contacted and notified that I did attempted to call RAD. I was told me that the report has been sent to the radiologist and is waiting to be read. PT verbalized understanding.  RAD also said they are down a few radiologist and readings are behind.  They said the report should be read by next week.

## 2023-09-18 NOTE — Progress Notes (Unsigned)
 Subjective:  Chief complaint: He is still having some GI upset from time to time also with some abdominal pain intermittently and with feculent material coming from her rectovaginal fistula    Patient ID: [name], female    DOB: 1956-01-15, 68 y.o.   MRN: [id number]  HPI   68 y.o. female with hx of scoliosis surgery with extensive lower thoracic and lumbar fusion roughly 77yrs ago, she suffered ground level fall in late November, where she fractures through all three columns of L3, thus went to OR on 12/9 for stabilization, new  posterolateral instrumentation fusion on June 26, 2022 complicated by Wound dehiscence with exposed hardware and unstable lumbar spine fracture status post revision of lumbar wound with removal of L1-3 4 and 5 lumbar screws and rod and removal of right L1 screw and revision of L1-L5 posterior instrumented fusion on August 09, 2022 and now concern for postoperative wound infection and ventriculitis. She had complicated protracted course in the icu, but eventually was discharged to CIR on vancomycin and cefepime through 3/14 for ventriculitis and hw complicating wound infection. Now changed to doxcycyline plus levofloxacin as of 3/15 while she continued to need medihoney with moistened quaze with sliver hydrofiber to the caudal portion of wound.   Mri of brain on 09/29/22- resolution of ventriculitis.   She was seen by my partner Dr. Drue Second while she was in the inpatient rehab unit.  Follow-up was rescheduled for May 2024.  Interim history:  "Back pain did not appear to be worse but wound did persist and apparently tunneled cranially.  Her white blood cell count with [facility] was in the 16,000 range an abrupt change from her last labs as an inpatient when it was in the 7000 range.  He did have a mechanical fall and fell on her left knee where she has a prosthetic joint.  When I last saw her we rechecked her inflammatory markers and sed rate had  titrated down though CRP was going up to 15.6  WBC recheck with our lab was 12,000.  In the interim she has been seen by Dr. Johnsie Cancel with neurosurgery and CT of the thoracic and lumbar spine were performed.  These have shown: A new T10 compression fracture with 12% loss of height with unchanged change adjacent chronic T10-T11 T12-L1 posterior element ankylosis with chronic nonfusion at T11-T12, L3 superior endplate nonacute fracture and subtle horizontal fracture of the chronic posterior element of the bone, difficult exclude nondisplaced S3-4 sacral fracture with mild presacral stranding.   Checked labs last time we saw her white blood cell count remained elevated 12,000.  Sed rate and CRP [place] sed rate actually normalized and CRP had gone up.  Changed her evofloxacin and doxycycline to cefdinir and doxycycline.  She is continuing to take these.  She did develop a painful erythematous rash on her hands due to doxycycline use in the context of sun exposure at the beach.  This is subsequently resolved she has a new somewhat purpuric rash on her right arm that came up in the last few days " She returns to clinic today for follow-up  Discussed the use of AI scribe software for clinical note transcription with the patient, who gave verbal consent to proceed.  At her last visit with me on July 14, 2023 the patient had developed worsening pain in the thoracic spine and hip.  She was also with reduced strength and mobility with having to to now use a walker compared to  a cane.  The patient had an MRI of the thoracic and lumbar spine pending after an initial scan done with [facility] had suggested potential infection in the spine.  She also has been found to have a 8 mm solid mass in the right lung on CT scan.  The MRI was ultimately read and showed  IMPRESSION: 1. Fluid signal within the L3-L4 and L4-L5 discs is suspicious for discitis-osteomyelitis. Progressed bone  marrow edema within the L4 vertebral body and bilateral pedicles. There is also increased bone marrow edema at the superior endplate of L5 and right L5 pedicle. 2. Possible subacute superior endplate fractures of the L4 and L5 vertebral bodies without associated height loss. 3. Minimal residual marrow edema associated with L3 superior endplate fracture. 4. 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  The case was discussed with Dr. Jake Samples and we both agreed that it would be prudent to have the patient's stop antibiotics and pursue IR guided biopsy for culture after she had been off antibiotics for several weeks.  I called the patient and her daughter on the 26th in the evening and asked that she stop antibiotics so that we could get a culture after she had been off antibiotics for several weeks.  She did not stop antibiotics that night.  In the interim she did have worsening of her back pain she underwent IR guided biopsy with culture yesterday with Gram stain having been negative no organisms isolated yet to date.  With regards to the her lung nodule we have done lab work here including a cryptococcal antigen in the serum which is negative urine histoplasma antigen and Blastomyces antigen.  She has been seen by Dr. Delton Coombes who noted that there was a pneumonia in the area where the lung nodule was seen and he wondered if this finding read as a nodule that might instead represent scarring.  He wanted to pursue further imaging with a PET scan before considering bronchoscopic be with BAL and biopsies.  At her visit with me on December 26th, 2024 patient had worsening pain in an thoracic and lumbar spine after initial scan with [facility] had suggested potential infection in the spine.  She was then found to have an 8 mm solid mass in the right lung on CT scan MRI was ultimately read and showed fluid signal within the L3-L4 and L4-L5 disc suspicious for discitis  osteomyelitis with progressed bone marrow edema in the L4 vertebral body and bilateral pedicles also with increased bone marrow edema in the superior endplate of L5 and right L5 pedicle with possible versus subacute.  Endplate fractures in L4 and L5 with minimal residual bone marrow associated L3 superior endplate fracture with chronic findings of arachnoiditis in the lower lumbar spine.  I called the patient and her daughter on the 51 and had them stop antibiotics with plans for IR guided biopsy for culture.  This was accomplished after they had held antibiotics for several weeks.  Cultures were unrevealing.  We subsequently started her on yet another empiric regimen of oral doxycycline and cefadroxil until we could get a line placed at that point in time we placed her on IV daptomycin and cefepime.  In the interim she had complained of vaginal discharge though now I have found out that she has a rectovaginal fistula which could certainly explain that problem.  She then began to complain of malaise with labile blood pressures and lightheadedness nauseousness with tachycardia.    The patient  thought there is due to side effects of IV antibiotics and we instructed  her hold IV cefepime and continue myosin but she actually did the reverse and stop the daptomycin and continued cefepime.  Recently yesterday when she was seen with cardiology she was hypotensive seated in the chair in the 60s and apparently they had recommended her being seen in the emergency room and potentially hospitalized.  She was given fluid bolus there with improvement in her blood pressure and her losartan is on hold  Her back pain does not seem to be worse she has no respiratory complaints she is not complaining now of much in the way of nausea or abdominal pain.  She is does continue to have discharge from her rectovaginal fistula and also "gas" that will emanate from this.  I am quite worried that I have other  intra-abdominal pathology such as intra-abdominal abscess or fistula to another organ that is causing all of her symptoms of malaise palpitations nausea.  Review of her labs from home health showed slight leukocytosis of white count to 14,000 with slight eosinophilia though not terribly much above the normal range her inflammatory markers have come down in the last 2 weeks from 72-23 in the case of her sed rate and 30 116 and 51 in terms of her CRP.  She had been seen by urogynecology who had found frank stool in the vagina from the fistula.  They recommended her being seen by general surgery and wanted a CT scan of the abdomen pelvis performed I think getting a CT scan abdomen pelvis is critical and I would feel better if we could have her directly admitted to the hospital to speed up the process of getting a CT of the abdomen pelvis because I am worried that she may have more pathology than the rectovaginal fistula such as an intra-abdominal abscess driving symptoms.  However Kathie Rhodes is emphatic about going back into the hospital if at all possible."   Since that visit she did not see Tammy Sours but rescheduled for today.  She had her CT abdomen pelvis performed which showed the following:  IMPRESSION: Image degradation in lower pelvis due to bilateral hip prostheses and rectal tube.   Findings highly suspicious for gas-containing rectovaginal fistula,  With linear gas collection between the anterior wall of the sigmoid colon and bladder to the left of midline which descends to the region of the vaginal cuff (see image 83/12), highly suspicious for rectovaginal fistula.   Diffuse colonic diverticulosis, with muscular hypertrophy and sigmoid colon. No radiographic evidence of acute diverticulitis.   Multiple ill-defined ground-glass and subsolid nodular opacities in the visualized portions of the right middle and lower lobes. This is almost certainly inflammatory or infectious in etiology  given that it is a new compared to recent PET-CT 1 month ago. Recommend clinical correlation, and continued follow-up by chest CT.    I saw the report of the CT scan with regards to the amatory groundglass opacity had some anxiety about whether she could be having some daptomycin induced eosinophilic pneumonia.  The patient however has no respiratory symptoms other than some chronic dyspnea that preceded her being on antibiotic she has no cough.  Her CBC shows no peripheral eosinophilia and I do not think she has daptomycin induced developed pneumonia.  She still has GI upset but is been able to take her daptomycin and cefepime with planned stop date of 18 March.  Her inflammatory markers have been a bit of a mixed  picture with her sed rate having gone up to 59 from a value of 23 but that was down from 72 her CRP has come down consistently now at 14 from 51 and 116 prior to that her back pain seems relatively stable though it is worse than it was when she first was dealing with her ventriculitis.  She does have a compression fracture as well.  Surgery is being contemplated by her urogynecologist potentially with general surgery involvement as well.  In talking the patient and her daughter it seems as if the surgeons want her back infection "declared cured prior to operating.  I do not think that she should be necessary because her spine if she still harbors infection is of much lower risk to her as far as uncontrolled infection than her rectovaginal fistula.   I have a much greater anxiety her nto have source control in her abdomen and pelvis.        Past Medical History:  Diagnosis Date   Anemia    Anxiety    Arthritis    Bipolar disorder (HCC)    Cardiomyopathy (HCC)    Depression    GERD (gastroesophageal reflux disease)    Heart failure (HCC)    Heart murmur    "related to VSD"   High cholesterol    History of blood transfusion    "related to OR" (08/19/2016)   History of  hiatal hernia    Hypertension    Hyperthyroidism    Hypotension 09/06/2023   Mild cognitive impairment 09/06/2018   Paroxysmal ventricular tachycardia (HCC)    Rectovaginal fistula 09/06/2023   Tachycardia, unspecified    Type II diabetes mellitus (HCC)    UTI (urinary tract infection)    being treated with Keflex   Ventricular septal defect    Ventriculitis of brain due to bacteria 11/02/2022   Vertebral fracture, osteoporotic (HCC) 01/06/2023    Past Surgical History:  Procedure Laterality Date   ABDOMINAL HYSTERECTOMY     BACK SURGERY     CARDIAC CATHETERIZATION N/A 06/23/2015   Procedure: Left Heart Cath and Coronary Angiography;  Surgeon: Laurey Morale, MD;  Location: [facility] INVASIVE CV LAB;  Service: Cardiovascular;  Laterality: N/A;   CARDIAC CATHETERIZATION  1960   "VSD was so small; didn't need repaired"   EXAM UNDER ANESTHESIA WITH MANIPULATION OF HIP Right 06/02/2014   dr Jerl Santos   FRACTURE SURGERY     HERNIA REPAIR     HIP CLOSED REDUCTION Right 06/02/2014   Procedure: CLOSED MANIPULATION HIP;  Surgeon: Velna Ochs, MD;  Location: [facility] OR;  Service: Orthopedics;  Laterality: Right;   IR FLUORO GUIDED NEEDLE PLC ASPIRATION/INJECTION LOC  08/09/2023   JOINT REPLACEMENT     JOINT REPLACEMENT     POSTERIOR LUMBAR FUSION 4 LEVEL N/A 06/26/2022   Procedure: Lumbar One To Lumbar Five Posterior Instrumented Fusion;  Surgeon: Jadene Pierini, MD;  Location: [facility] OR;  Service: Neurosurgery;  Laterality: N/A;   REFRACTIVE SURGERY Bilateral    SHOULDER ARTHROSCOPY Right    SHOULDER OPEN ROTATOR CUFF REPAIR Right    SPINAL FUSION  1996   "t10 down to my coccyx   SPINE HARDWARE REMOVAL     TOTAL ABDOMINAL HYSTERECTOMY     TOTAL HIP ARTHROPLASTY Right 05/10/2014   [place]      by dr Cristal Deer olcott   TOTAL KNEE ARTHROPLASTY Left    TOTAL SHOULDER ARTHROPLASTY Left 08/19/2016   Procedure: TOTAL SHOULDER ARTHROPLASTY;  Surgeon: Jones Broom,  MD;  Location: [facility] OR;   Service: Orthopedics;  Laterality: Left;  Left total shoulder replacement    Family History  Problem Relation Age of Onset   Other Mother        alive   Stroke Father 45       deceased   Dementia Father    Chorea Maternal Grandfather    Dementia Maternal Aunt    Dementia Maternal Aunt    Heart attack Other        multiple uncles have died with myocardial infarction   Bladder Cancer Neg Hx    Uterine cancer Neg Hx       Social History   Socioeconomic History   Marital status: Married    Spouse name: Not on file   Number of children: Not on file   Years of education: Not on file   Highest education level: Master's degree (e.g., MA, MS, MEng, MEd, MSW, MBA)  Occupational History   Occupation: Magazine features editor: SELF-EMPLOYED    Comment: former  Tobacco Use   Smoking status: Every Day    Types: Cigarettes    Passive exposure: Never   Smokeless tobacco: Never   Tobacco comments:    03/30/21 smokes 2 cigs daily.      08/06/22 Shanda Bumps stated that the patient is not smoking while at [facility] for rehab. [facility]  Vaping Use   Vaping status: Never Used  Substance and Sexual Activity   Alcohol use: No   Drug use: No   Sexual activity: Not Currently    Partners: Male    Birth control/protection: Surgical    Comment: Hysterectomy  Other Topics Concern   Not on file  Social History Narrative   Right handed   Caffeine 2-3 cups daily    Lives at home with husband    Social Drivers of Health   Financial Resource Strain: Low Risk  (07/08/2023)   Received from [facility]   Overall Financial Resource Strain (CARDIA)    Difficulty of Paying Living Expenses: Not hard at all  Food Insecurity: No Food Insecurity (07/08/2023)   Received from [facility]   Hunger Vital Sign    Worried About Running Out of Food in the Last Year: Never true    Ran Out of Food in the Last Year: Never true  Transportation Needs: No Transportation Needs  (07/08/2023)   Received from [facility] - Transportation    In the past 12 months, has lack of transportation kept you from medical appointments or from getting medications?: No    Lack of Transportation (Non-Medical): No  Physical Activity: Not on file  Stress: Not on file  Social Connections: Not on file    Allergies  Allergen Reactions   Gallium (Radioactive Isotope) Swelling    Pt states she can not have radioactive injections.    Paxlovid [Nirmatrelvir-Ritonavir]      Current Outpatient Medications:    acetaminophen (TYLENOL) 325 MG tablet, Take 1-2 tablets (325-650 mg total) by mouth every 6 (six) hours as needed for mild pain or headache., Disp: , Rfl:    ascorbic acid (VITAMIN C) 500 MG tablet, Take 500 mg by mouth daily., Disp: , Rfl:    ASPIRIN 81 PO, Take 1 tablet by mouth daily., Disp: , Rfl:    buprenorphine (BUTRANS) 10 MCG/HR PTWK, Place onto the skin., Disp: , Rfl:    buPROPion (WELLBUTRIN XL) 150 MG 24 hr  tablet, Take 1 tablet (150 mg total) by mouth daily., Disp: 30 tablet, Rfl: 0   carvedilol (COREG) 25 MG tablet, Take 1 tablet (25 mg total) by mouth 2 (two) times daily with a meal., Disp: 60 tablet, Rfl: 0   ceFEPime (MAXIPIME) IVPB, Inject 500 mg into the vein every 12 (twelve) hours. (Patient not taking: Reported on 09/07/2023), Disp: , Rfl:    Cholecalciferol (VITAMIN D3) 50 MCG (2000 UT) capsule, Take 2,000 Units by mouth daily., Disp: , Rfl:    cyclobenzaprine (FLEXERIL) 10 MG tablet, Take 10 mg by mouth 3 (three) times daily., Disp: , Rfl:    DAPTOmycin (CUBICIN) 500 MG injection, Inject into the vein., Disp: , Rfl:    daptomycin (CUBICIN) IVPB, Inject 500 mg into the vein daily., Disp: , Rfl:    diphenoxylate-atropine (LOMOTIL) 2.5-0.025 MG tablet, Take 1 tablet by mouth 2 (two) times daily as needed for diarrhea or loose stools., Disp: 30 tablet, Rfl: 0   donepezil (ARICEPT) 10 MG tablet, Take 10 mg by mouth every morning., Disp:  , Rfl:    empagliflozin (JARDIANCE) 10 MG TABS tablet, Take 1 tablet (10 mg total) by mouth daily before breakfast., Disp: 90 tablet, Rfl: 3   estradiol (ESTRACE) 0.1 MG/GM vaginal cream, Place 0.5g nightly for two weeks then twice a week after, Disp: 30 g, Rfl: 3   etodolac (LODINE) 400 MG tablet, Take 400 mg by mouth every 8 (eight) hours as needed., Disp: , Rfl:    famotidine (PEPCID) 20 MG tablet, Take 1 tablet (20 mg total) by mouth 2 (two) times daily., Disp: 60 tablet, Rfl: 0   fenoprofen (NALFON) 600 MG TABS tablet, Take 600 mg by mouth in the morning and at bedtime., Disp: , Rfl:    fluvoxaMINE (LUVOX) 100 MG tablet, Take 1 tablet (100 mg total) by mouth at bedtime., Disp: 30 tablet, Rfl: 0   gabapentin (NEURONTIN) 100 MG capsule, Take 1 capsule (100 mg total) by mouth 3 (three) times daily., Disp: 90 capsule, Rfl: 0   HYDROcodone-acetaminophen (NORCO/VICODIN) 5-325 MG tablet, Take 1 tablet by mouth every 4 (four) hours as needed for moderate pain (pain score 4-6)., Disp: , Rfl:    Lactobacillus (ACIDOPHILUS) 100 MG CAPS, Take 1 capsule (100 mg total) by mouth 2 (two) times daily., Disp: 60 capsule, Rfl: 0   lamoTRIgine (LAMICTAL) 100 MG tablet, Take 200 mg by mouth at bedtime., Disp: , Rfl:    levothyroxine (SYNTHROID) 88 MCG tablet, Take 1 tablet (88 mcg total) by mouth daily before breakfast., Disp: 30 tablet, Rfl: 0   losartan (COZAAR) 25 MG tablet, Take 0.5 tablets (12.5 mg total) by mouth daily., Disp: , Rfl:    melatonin 5 MG TABS, Take 1 tablet (5 mg total) by mouth at bedtime as needed., Disp: 30 tablet, Rfl: 0   metFORMIN (GLUCOPHAGE) 500 MG tablet, Take 1 tablet (500 mg total) by mouth 2 (two) times daily with a meal., Disp: 60 tablet, Rfl: 0   Multiple Vitamin (MULTIVITAMIN) tablet, Take 1 tablet by mouth daily.  , Disp: , Rfl:    Multiple Vitamins-Minerals (ZINC PO), Take 50 mg by mouth daily., Disp: , Rfl:    nicotine (NICODERM CQ - DOSED IN MG/24 HR) 7 mg/24hr patch, Place 1  patch (7 mg total) onto the skin daily., Disp: 28 patch, Rfl: 0   ondansetron (ZOFRAN) 4 MG tablet, Take 1 tablet (4 mg total) by mouth every 8 (eight) hours as needed for refractory nausea / vomiting.,  Disp: 20 tablet, Rfl: 0   potassium chloride (KLOR-CON) 10 MEQ tablet, Take 30 mEq by mouth 2 (two) times daily., Disp: , Rfl:    RESTASIS MULTIDOSE 0.05 % ophthalmic emulsion, 1 drop 2 (two) times daily., Disp: , Rfl:    rosuvastatin (CRESTOR) 20 MG tablet, 1 tablet by mouth Once a day for 30 days, Disp: , Rfl:    sodium chloride 1 g tablet, Take 1 tablet (1 g total) by mouth 2 (two) times daily with a meal., Disp: 60 tablet, Rfl: 0   traZODone (DESYREL) 100 MG tablet, Take 1 tablet (100 mg total) by mouth at bedtime., Disp: 30 tablet, Rfl: 0   Review of Systems  Constitutional:  Negative for activity change, appetite change, chills, diaphoresis, fatigue, fever and unexpected weight change.  HENT:  Negative for congestion, rhinorrhea, sinus pressure, sneezing, sore throat and trouble swallowing.   Eyes:  Negative for photophobia and visual disturbance.  Respiratory:  Negative for cough, chest tightness, shortness of breath, wheezing and stridor.   Cardiovascular:  Negative for chest pain, palpitations and leg swelling.  Gastrointestinal:  Positive for diarrhea and nausea. Negative for abdominal distention, abdominal pain, anal bleeding, blood in stool, constipation and vomiting.  Genitourinary:  Positive for vaginal discharge. Negative for difficulty urinating, dysuria, flank pain and hematuria.  Musculoskeletal:  Negative for arthralgias, back pain, gait problem, joint swelling and myalgias.  Skin:  Negative for color change, pallor, rash and wound.  Neurological:  Negative for dizziness, tremors, weakness and light-headedness.  Hematological:  Negative for adenopathy. Does not bruise/bleed easily.  Psychiatric/Behavioral:  Negative for agitation, behavioral problems, confusion, decreased  concentration, dysphoric mood and sleep disturbance.        Objective:   Physical Exam Constitutional:      General: She is not in acute distress.    Appearance: She is not diaphoretic.  HENT:     Head: Normocephalic and atraumatic.     Right Ear: External ear normal.     Left Ear: External ear normal.     Nose: Nose normal.     Mouth/Throat:     Pharynx: No oropharyngeal exudate.  Eyes:     General: No scleral icterus.       Right eye: No discharge.        Left eye: No discharge.     Extraocular Movements: Extraocular movements intact.     Conjunctiva/sclera: Conjunctivae normal.  Cardiovascular:     Rate and Rhythm: Normal rate and regular rhythm.     Heart sounds: No murmur heard.    No friction rub. No gallop.  Pulmonary:     Effort: Pulmonary effort is normal. No respiratory distress.     Breath sounds: No stridor. No wheezing, rhonchi or rales.  Abdominal:     General: Bowel sounds are normal. There is no distension.     Palpations: Abdomen is soft.     Tenderness: There is no abdominal tenderness. There is no rebound.  Musculoskeletal:        General: No tenderness. Normal range of motion.     Cervical back: Normal range of motion and neck supple.  Lymphadenopathy:     Cervical: No cervical adenopathy.  Skin:    General: Skin is warm and dry.     Coloration: Skin is not jaundiced or pale.     Findings: No erythema, lesion or rash.  Neurological:     General: No focal deficit present.     Mental Status: She is  alert and oriented to person, place, and time.     Coordination: Coordination normal.  Psychiatric:        Mood and Affect: Mood normal.        Behavior: Behavior normal.        Thought Content: Thought content normal.        Judgment: Judgment normal.          Assessment & Plan:    Hardware associated diskitis, vertebral osteomyelitis  I think she is doing relatively well with his relative stability of back pain and 1 inflammatory marker  having essentially normalized and the other 1 varying a fair bit is also hard to really interpret her inflammatory markers with multiple confounders including her rectovaginal fistula.  Will plan on her completing her IV daptomycin and cefepime through the 18th.  I would then likely switch her to augmentin and doxycyline  I really really do not think that her infection in her spine would her at any significant increased operative risk in terms of infection with regards to surgeries addressing her  abdomen and pelvis and if there was really risk  having her on antbitotics rather than off would be the safer  Rectovaginal fistula: I suspect this happened from a perforation related to undiagnosed diverticulitis she does have extensive diverticulosis again I have much more anxiety about this site putting her at risk for significant pathology including sepsis.  I want to add metronidazole to her cefepime and daptomycin to give her some anaerobic coverage for this area.  I again DO NOT think her spine will pose any infectious risk to the abdominal pelvic surgery.    Groundglass opacities:  I do not think this is due to eosinophilic pneumonia.  Lung nodule followed by Dr. Delton Coombes.  There is no apparent hypermetabolism and she is scheduled for repeat super resolution CT    I have personally spent 40 minutes involved in face-to-face and non-face-to-face activities for this patient on the day of the visit. Professional time spent includes the following activities: Preparing to see the patient (review of tests), Obtaining and/or reviewing separately obtained history (admission/discharge record), Performing a medically appropriate examination and/or evaluation , Ordering medications/tests/procedures, referring and communicating with other health care professionals, Documenting clinical information in the EMR, Independently interpreting results (not separately reported), Communicating results to the  patient/family/caregiver, Counseling and educating the patient/family/caregiver and Care coordination (not separately reported).

## 2023-09-19 ENCOUNTER — Encounter: Payer: Self-pay | Admitting: Infectious Disease

## 2023-09-19 ENCOUNTER — Other Ambulatory Visit: Payer: Self-pay

## 2023-09-19 ENCOUNTER — Ambulatory Visit: Payer: Medicare Other | Admitting: Infectious Disease

## 2023-09-19 ENCOUNTER — Encounter: Payer: Self-pay | Admitting: Obstetrics

## 2023-09-19 VITALS — BP 117/72 | HR 80 | Temp 97.2°F

## 2023-09-19 DIAGNOSIS — M4626 Osteomyelitis of vertebra, lumbar region: Secondary | ICD-10-CM | POA: Diagnosis not present

## 2023-09-19 DIAGNOSIS — N823 Fistula of vagina to large intestine: Secondary | ICD-10-CM | POA: Diagnosis not present

## 2023-09-19 DIAGNOSIS — T847XXS Infection and inflammatory reaction due to other internal orthopedic prosthetic devices, implants and grafts, sequela: Secondary | ICD-10-CM | POA: Diagnosis not present

## 2023-09-19 DIAGNOSIS — M4646 Discitis, unspecified, lumbar region: Secondary | ICD-10-CM

## 2023-09-19 DIAGNOSIS — M8008XS Age-related osteoporosis with current pathological fracture, vertebra(e), sequela: Secondary | ICD-10-CM

## 2023-09-19 DIAGNOSIS — R918 Other nonspecific abnormal finding of lung field: Secondary | ICD-10-CM

## 2023-09-19 DIAGNOSIS — R911 Solitary pulmonary nodule: Secondary | ICD-10-CM

## 2023-09-19 DIAGNOSIS — K5790 Diverticulosis of intestine, part unspecified, without perforation or abscess without bleeding: Secondary | ICD-10-CM

## 2023-09-19 DIAGNOSIS — G0490 Encephalitis and encephalomyelitis, unspecified: Secondary | ICD-10-CM

## 2023-09-19 MED ORDER — METRONIDAZOLE 500 MG PO TABS: 500.0000 mg | ORAL_TABLET | Freq: Two times a day (BID) | ORAL | 0 refills | Status: AC

## 2023-09-19 NOTE — Progress Notes (Signed)
 Referral has been faxed.

## 2023-09-19 NOTE — Addendum Note (Signed)
 Addended byWyatt Haste T on: 09/19/2023 11:03 AM   Modules accepted: Orders

## 2023-09-21 ENCOUNTER — Ambulatory Visit: Payer: Medicare Other | Admitting: "Endocrinology

## 2023-09-22 LAB — LAB REPORT - SCANNED: EGFR: 101

## 2023-09-27 ENCOUNTER — Ambulatory Visit: Payer: Medicare Other | Admitting: Obstetrics

## 2023-09-27 ENCOUNTER — Telehealth: Payer: Self-pay

## 2023-09-27 NOTE — Telephone Encounter (Signed)
 Patient is scheduled with [facility] on [id number]

## 2023-09-29 ENCOUNTER — Ambulatory Visit: Payer: Medicare Other | Admitting: Physical Medicine & Rehabilitation

## 2023-09-30 ENCOUNTER — Ambulatory Visit: Payer: Medicare Other | Admitting: Emergency Medicine

## 2023-10-04 ENCOUNTER — Encounter: Payer: Self-pay | Admitting: Infectious Disease

## 2023-10-04 DIAGNOSIS — M4640 Discitis, unspecified, site unspecified: Secondary | ICD-10-CM | POA: Insufficient documentation

## 2023-10-04 NOTE — Progress Notes (Unsigned)
 Subjective:  Chief complaint: ongoing back pain   Patient ID: [name], female    DOB: [birth year], 68 y.o.   MRN: [id number]  HPI   68 y.o. female with hx of scoliosis surgery with extensive lower thoracic and lumbar fusion roughly 27yrs ago, she suffered ground level fall in late November, where she fractures through all three columns of L3, thus went to OR on 12/9 for stabilization, new  posterolateral instrumentation fusion on June 26, 2022 complicated by Wound dehiscence with exposed hardware and unstable lumbar spine fracture status post revision of lumbar wound with removal of L1-3 4 and 5 lumbar screws and rod and removal of right L1 screw and revision of L1-L5 posterior instrumented fusion on August 09, 2022 and now concern for postoperative wound infection and ventriculitis. She had complicated protracted course in the icu, but eventually was discharged to CIR on vancomycin and cefepime through 3/14 for ventriculitis and hw complicating wound infection. Now changed to doxcycyline plus levofloxacin as of 3/15 while she continued to need medihoney with moistened quaze with sliver hydrofiber to the caudal portion of wound.   Mri of brain on 09/29/22- resolution of ventriculitis.   She was seen by my partner Dr. Drue Second while she was in the inpatient rehab unit.  Follow-up was rescheduled for May 2024.  Interim history:  "Back pain did not appear to be worse but wound did persist and apparently tunneled cranially.  Her white blood cell count with [facility] was in the 16,000 range an abrupt change from her last labs as an inpatient when it was in the 7000 range.  He did have a mechanical fall and fell on her left knee where she has a prosthetic joint.  When I last saw her we rechecked her inflammatory markers and sed rate had titrated down though CRP was going up to 15.6  WBC recheck with our lab was 12,000.  In the interim she has been seen by Dr. Johnsie Cancel with  neurosurgery and CT of the thoracic and lumbar spine were performed.  These have shown: A new T10 compression fracture with 12% loss of height with unchanged change adjacent chronic T10-T11 T12-L1 posterior element ankylosis with chronic nonfusion at T11-T12, L3 superior endplate nonacute fracture and subtle horizontal fracture of the chronic posterior element of the bone, difficult exclude nondisplaced S3-4 sacral fracture with mild presacral stranding.   Checked labs last time we saw her white blood cell count remained elevated 12,000.  Sed rate and CRP [place] sed rate actually normalized and CRP had gone up.  Changed her evofloxacin and doxycycline to cefdinir and doxycycline.  She is continuing to take these.  She did develop a painful erythematous rash on her hands due to doxycycline use in the context of sun exposure at the beach.  This is subsequently resolved she has a new somewhat purpuric rash on her right arm that came up in the last few days " She returns to clinic today for follow-up  Discussed the use of AI scribe software for clinical note transcription with the patient, who gave verbal consent to proceed.  At her last visit with me on July 14, 2023 the patient had developed worsening pain in the thoracic spine and hip.  She was also with reduced strength and mobility with having to to now use a walker compared to a cane.  The patient had an MRI of the thoracic and lumbar spine pending after an initial scan done with [facility] had  suggested potential infection in the spine.  She also has been found to have a 8 mm solid mass in the right lung on CT scan.  The MRI was ultimately read and showed  IMPRESSION: 1. Fluid signal within the L3-L4 and L4-L5 discs is suspicious for discitis-osteomyelitis. Progressed bone marrow edema within the L4 vertebral body and bilateral pedicles. There is also increased bone marrow edema at the superior endplate of L5 and  right L5 pedicle. 2. Possible subacute superior endplate fractures of the L4 and L5 vertebral bodies without associated height loss. 3. Minimal residual marrow edema associated with L3 superior endplate fracture. 4. 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  The case was discussed with Dr. Jake Samples and we both agreed that it would be prudent to have the patient's stop antibiotics and pursue IR guided biopsy for culture after she had been off antibiotics for several weeks.  I called the patient and her daughter on the 26th in the evening and asked that she stop antibiotics so that we could get a culture after she had been off antibiotics for several weeks.  She did not stop antibiotics that night.  In the interim she did have worsening of her back pain she underwent IR guided biopsy with culture yesterday with Gram stain having been negative no organisms isolated yet to date.  With regards to the her lung nodule we have done lab work here including a cryptococcal antigen in the serum which is negative urine histoplasma antigen and Blastomyces antigen.  She has been seen by Dr. Delton Coombes who noted that there was a pneumonia in the area where the lung nodule was seen and he wondered if this finding read as a nodule that might instead represent scarring.  He wanted to pursue further imaging with a PET scan before considering bronchoscopic be with BAL and biopsies.  At her visit with me on December 26th, 2024 patient had worsening pain in an thoracic and lumbar spine after initial scan with [facility] had suggested potential infection in the spine.  She was then found to have an 8 mm solid mass in the right lung on CT scan MRI was ultimately read and showed fluid signal within the L3-L4 and L4-L5 disc suspicious for discitis osteomyelitis with progressed bone marrow edema in the L4 vertebral body and bilateral pedicles also with increased bone marrow edema in the superior  endplate of L5 and right L5 pedicle with possible versus subacute.  Endplate fractures in L4 and L5 with minimal residual bone marrow associated L3 superior endplate fracture with chronic findings of arachnoiditis in the lower lumbar spine.  I called the patient and her daughter on the 32 and had them stop antibiotics with plans for IR guided biopsy for culture.  This was accomplished after they had held antibiotics for several weeks.  Cultures were unrevealing.  We subsequently started her on yet another empiric regimen of oral doxycycline and cefadroxil until we could get a line placed at that point in time we placed her on IV daptomycin and cefepime.  In the interim she had complained of vaginal discharge though now I have found out that she has a rectovaginal fistula which could certainly explain that problem.  She then began to complain of malaise with labile blood pressures and lightheadedness nauseousness with tachycardia.    The patient thought there is due to side effects of IV antibiotics and we instructed  her hold IV cefepime and continue myosin but she actually  did the reverse and stop the daptomycin and continued cefepime.  Recently yesterday when she was seen with cardiology she was hypotensive seated in the chair in the 60s and apparently they had recommended her being seen in the emergency room and potentially hospitalized.  She was given fluid bolus there with improvement in her blood pressure and her losartan is on hold  Her back pain does not seem to be worse she has no respiratory complaints she is not complaining now of much in the way of nausea or abdominal pain.  She is does continue to have discharge from her rectovaginal fistula and also "gas" that will emanate from this.  I am quite worried that I have other intra-abdominal pathology such as intra-abdominal abscess or fistula to another organ that is causing all of her symptoms of malaise palpitations  nausea.  Review of her labs from home health showed slight leukocytosis of white count to 14,000 with slight eosinophilia though not terribly much above the normal range her inflammatory markers have come down in the last 2 weeks from 72-23 in the case of her sed rate and 30 116 and 51 in terms of her CRP.  She had been seen by urogynecology who had found frank stool in the vagina from the fistula.  They recommended her being seen by general surgery and wanted a CT scan of the abdomen pelvis performed I think getting a CT scan abdomen pelvis is critical and I would feel better if we could have her directly admitted to the hospital to speed up the process of getting a CT of the abdomen pelvis because I am worried that she may have more pathology than the rectovaginal fistula such as an intra-abdominal abscess driving symptoms.  However [name] is emphatic about going back into the hospital if at all possible."    She had her CT abdomen pelvis performed which showed the following:  IMPRESSION: Image degradation in lower pelvis due to bilateral hip prostheses and rectal tube.   Findings highly suspicious for gas-containing rectovaginal fistula,  With linear gas collection between the anterior wall of the sigmoid colon and bladder to the left of midline which descends to the region of the vaginal cuff (see image 83/12), highly suspicious for rectovaginal fistula.   Diffuse colonic diverticulosis, with muscular hypertrophy and sigmoid colon. No radiographic evidence of acute diverticulitis.   Multiple ill-defined ground-glass and subsolid nodular opacities in the visualized portions of the right middle and lower lobes. This is almost certainly inflammatory or infectious in etiology given that it is a new compared to recent PET-CT 1 month ago. Recommend clinical correlation, and continued follow-up by chest CT.    I saw the report of the CT scan with regards to the amatory groundglass  opacity had some anxiety about whether she could be having some daptomycin induced eosinophilic pneumonia.  The patient however has no respiratory symptoms other than some chronic dyspnea that preceded her being on antibiotic she has no cough.  Her CBC shows no peripheral eosinophilia and I do not think she has daptomycin induced developed pneumonia.  She still has GI upset but is been able to take her daptomycin and cefepime with planned stop date of 18 March.  Her inflammatory markers have been a bit of a mixed picture with her sed rate having gone up to 59 from a value of 23 but that was down from 72 her CRP has come down consistently now at 14 from 51 and 116 prior to  that her back pain seems relatively stable though it is worse than it was when she first was dealing with her ventriculitis.  She does have a compression fracture as well.  Surgery is being contemplated by her urogynecologist potentially with general surgery involvement as well.  In talking the patient and her daughter it seems as if the surgeons want her back infection "declared cured prior to operating.  I do not think that she should be necessary because her spine if she still harbors infection is of much lower risk to her as far as uncontrolled infection than her rectovaginal fistula.   I have a much greater anxiety her nto have source control in her abdomen and pelvis.     Discussed the use of AI scribe software for clinical note transcription with the patient, who gave verbal consent to proceed.  History of Present Illness   [name] "[name]" is a 68 year old female with a history of spinal infection  complicated by hardware and also ventriculitis and fracture who presents with ongoing severe pain and infection management. She is accompanied by her daughter, who is also her primary caregiver.  She has been experiencing severe pain since a spinal infection and T10 fracture were identified in October and November  of the previous year. The pain remains significant and impacts her daily life. She has been on narcotics and other pain management medications for eight months, with concerns about inadequete pain control She is currently out of her buprenorphine pain patch and hydrocodone.  Her recent lab results show a decrease in C-reactive protein from 14 in February to 5 in early March, indicating some improvement in  ONE of the inflammation markers. The sedimentation rate remains similar to previous levels. She has completed a course of IV antibiotics and is transitioning to oral antibiotics, including doxycycline and amoxicillin clavulanic acid, for ongoing infection management.  Her most recent MRI of the thoracic spine on December 22nd showed a T10 fracture. She has a history of L3 fracture as well. The pain has been persistent since these events, and she has undergone a CT scan due to increasing post-operative pain.   She has as mentioned a rectovaginal fistula and has been referred to [facility] and has appt with Karie Soda, MD, while also being followed by Gynecology for likely joint General Surgery and Gynecology Surgery.         Past Medical History:  Diagnosis Date   Anemia    Anxiety    Arthritis    Bipolar disorder (HCC)    Cardiomyopathy (HCC)    Depression    GERD (gastroesophageal reflux disease)    Heart failure (HCC)    Heart murmur    "related to VSD"   High cholesterol    History of blood transfusion    "related to OR" (08/19/2016)   History of hiatal hernia    Hypertension    Hyperthyroidism    Hypotension 09/06/2023   Mild cognitive impairment 09/06/2018   Paroxysmal ventricular tachycardia (HCC)    Rectovaginal fistula 09/06/2023   Tachycardia, unspecified    Type II diabetes mellitus (HCC)    UTI (urinary tract infection)    being treated with Keflex   Ventricular septal defect    Ventriculitis of brain due to bacteria 11/02/2022   Vertebral fracture, osteoporotic (HCC)  01/06/2023    Past Surgical History:  Procedure Laterality Date   ABDOMINAL HYSTERECTOMY     BACK SURGERY     CARDIAC CATHETERIZATION N/A  06/23/2015   Procedure: Left Heart Cath and Coronary Angiography;  Surgeon: Laurey Morale, MD;  Location: [facility] INVASIVE CV LAB;  Service: Cardiovascular;  Laterality: N/A;   CARDIAC CATHETERIZATION  1960   "VSD was so small; didn't need repaired"   EXAM UNDER ANESTHESIA WITH MANIPULATION OF HIP Right 06/02/2014   dr Jerl Santos   FRACTURE SURGERY     HERNIA REPAIR     HIP CLOSED REDUCTION Right 06/02/2014   Procedure: CLOSED MANIPULATION HIP;  Surgeon: Velna Ochs, MD;  Location: [facility] OR;  Service: Orthopedics;  Laterality: Right;   IR FLUORO GUIDED NEEDLE PLC ASPIRATION/INJECTION LOC  08/09/2023   JOINT REPLACEMENT     JOINT REPLACEMENT     POSTERIOR LUMBAR FUSION 4 LEVEL N/A 06/26/2022   Procedure: Lumbar One To Lumbar Five Posterior Instrumented Fusion;  Surgeon: Jadene Pierini, MD;  Location: [facility] OR;  Service: Neurosurgery;  Laterality: N/A;   REFRACTIVE SURGERY Bilateral    SHOULDER ARTHROSCOPY Right    SHOULDER OPEN ROTATOR CUFF REPAIR Right    SPINAL FUSION  1996   "t10 down to my coccyx   SPINE HARDWARE REMOVAL     TOTAL ABDOMINAL HYSTERECTOMY     TOTAL HIP ARTHROPLASTY Right 05/10/2014   [facility]      by dr Cristal Deer olcott   TOTAL KNEE ARTHROPLASTY Left    TOTAL SHOULDER ARTHROPLASTY Left 08/19/2016   Procedure: TOTAL SHOULDER ARTHROPLASTY;  Surgeon: Jones Broom, MD;  Location: [facility] OR;  Service: Orthopedics;  Laterality: Left;  Left total shoulder replacement    Family History  Problem Relation Age of Onset   Other Mother        alive   Stroke Father 11       deceased   Dementia Father    Chorea Maternal Grandfather    Dementia Maternal Aunt    Dementia Maternal Aunt    Heart attack Other        multiple uncles have died with myocardial infarction   Bladder Cancer Neg Hx    Uterine cancer Neg Hx       Social  History   Socioeconomic History   Marital status: Married    Spouse name: Not on file   Number of children: Not on file   Years of education: Not on file   Highest education level: Master's degree (e.g., MA, MS, MEng, MEd, MSW, MBA)  Occupational History   Occupation: Magazine features editor: SELF-EMPLOYED    Comment: former  Tobacco Use   Smoking status: Every Day    Types: Cigarettes    Passive exposure: Never   Smokeless tobacco: Never   Tobacco comments:    03/30/21 smokes 2 cigs daily.      08/06/22 Shanda Bumps stated that the patient is not smoking while at [facility] for rehab. [facility]  Vaping Use   Vaping status: Never Used  Substance and Sexual Activity   Alcohol use: No   Drug use: No   Sexual activity: Not Currently    Partners: Male    Birth control/protection: Surgical    Comment: Hysterectomy  Other Topics Concern   Not on file  Social History Narrative   Right handed   Caffeine 2-3 cups daily    Lives at home with husband    Social Drivers of Health   Financial Resource Strain: Low Risk  (07/08/2023)   Received from [facility]   Overall Financial Resource Strain (CARDIA)    Difficulty of  Paying Living Expenses: Not hard at all  Food Insecurity: No Food Insecurity (07/08/2023)   Received from [facility]   Hunger Vital Sign    Worried About Running Out of Food in the Last Year: Never true    Ran Out of Food in the Last Year: Never true  Transportation Needs: No Transportation Needs (07/08/2023)   Received from [facility] - Transportation    In the past 12 months, has lack of transportation kept you from medical appointments or from getting medications?: No    Lack of Transportation (Non-Medical): No  Physical Activity: Not on file  Stress: Not on file  Social Connections: Not on file    Allergies  Allergen Reactions   Gallium (Radioactive Isotope) Swelling    Pt states she can not have  radioactive injections.    Paxlovid [Nirmatrelvir-Ritonavir]      Current Outpatient Medications:    acetaminophen (TYLENOL) 325 MG tablet, Take 1-2 tablets (325-650 mg total) by mouth every 6 (six) hours as needed for mild pain or headache., Disp: , Rfl:    ascorbic acid (VITAMIN C) 500 MG tablet, Take 500 mg by mouth daily., Disp: , Rfl:    ASPIRIN 81 PO, Take 1 tablet by mouth daily., Disp: , Rfl:    buprenorphine (BUTRANS) 10 MCG/HR PTWK, Place onto the skin., Disp: , Rfl:    buPROPion (WELLBUTRIN XL) 150 MG 24 hr tablet, Take 1 tablet (150 mg total) by mouth daily., Disp: 30 tablet, Rfl: 0   carvedilol (COREG) 25 MG tablet, Take 1 tablet (25 mg total) by mouth 2 (two) times daily with a meal., Disp: 60 tablet, Rfl: 0   ceFEPime (MAXIPIME) IVPB, Inject 500 mg into the vein every 12 (twelve) hours., Disp: , Rfl:    Cholecalciferol (VITAMIN D3) 50 MCG (2000 UT) capsule, Take 2,000 Units by mouth daily., Disp: , Rfl:    cyclobenzaprine (FLEXERIL) 10 MG tablet, Take 10 mg by mouth 3 (three) times daily., Disp: , Rfl:    DAPTOmycin (CUBICIN) 500 MG injection, Inject into the vein., Disp: , Rfl:    daptomycin (CUBICIN) IVPB, Inject 500 mg into the vein daily., Disp: , Rfl:    diphenoxylate-atropine (LOMOTIL) 2.5-0.025 MG tablet, Take 1 tablet by mouth 2 (two) times daily as needed for diarrhea or loose stools., Disp: 30 tablet, Rfl: 0   donepezil (ARICEPT) 10 MG tablet, Take 10 mg by mouth every morning., Disp: , Rfl:    empagliflozin (JARDIANCE) 10 MG TABS tablet, Take 1 tablet (10 mg total) by mouth daily before breakfast., Disp: 90 tablet, Rfl: 3   estradiol (ESTRACE) 0.1 MG/GM vaginal cream, Place 0.5g nightly for two weeks then twice a week after, Disp: 30 g, Rfl: 3   etodolac (LODINE) 400 MG tablet, Take 400 mg by mouth every 8 (eight) hours as needed., Disp: , Rfl:    famotidine (PEPCID) 20 MG tablet, Take 1 tablet (20 mg total) by mouth 2 (two) times daily., Disp: 60 tablet, Rfl: 0    fenoprofen (NALFON) 600 MG TABS tablet, Take 600 mg by mouth in the morning and at bedtime., Disp: , Rfl:    fluvoxaMINE (LUVOX) 100 MG tablet, Take 1 tablet (100 mg total) by mouth at bedtime., Disp: 30 tablet, Rfl: 0   gabapentin (NEURONTIN) 100 MG capsule, Take 1 capsule (100 mg total) by mouth 3 (three) times daily., Disp: 90 capsule, Rfl: 0   HYDROcodone-acetaminophen (NORCO/VICODIN) 5-325 MG tablet, Take 1  tablet by mouth every 4 (four) hours as needed for moderate pain (pain score 4-6)., Disp: , Rfl:    Lactobacillus (ACIDOPHILUS) 100 MG CAPS, Take 1 capsule (100 mg total) by mouth 2 (two) times daily., Disp: 60 capsule, Rfl: 0   lamoTRIgine (LAMICTAL) 100 MG tablet, Take 200 mg by mouth at bedtime., Disp: , Rfl:    levothyroxine (SYNTHROID) 88 MCG tablet, Take 1 tablet (88 mcg total) by mouth daily before breakfast., Disp: 30 tablet, Rfl: 0   losartan (COZAAR) 25 MG tablet, Take 0.5 tablets (12.5 mg total) by mouth daily., Disp: , Rfl:    melatonin 5 MG TABS, Take 1 tablet (5 mg total) by mouth at bedtime as needed., Disp: 30 tablet, Rfl: 0   metFORMIN (GLUCOPHAGE) 500 MG tablet, Take 1 tablet (500 mg total) by mouth 2 (two) times daily with a meal., Disp: 60 tablet, Rfl: 0   metroNIDAZOLE (FLAGYL) 500 MG tablet, Take 1 tablet (500 mg total) by mouth 2 (two) times daily for 16 days., Disp: 32 tablet, Rfl: 0   Multiple Vitamin (MULTIVITAMIN) tablet, Take 1 tablet by mouth daily.  , Disp: , Rfl:    Multiple Vitamins-Minerals (ZINC PO), Take 50 mg by mouth daily., Disp: , Rfl:    nicotine (NICODERM CQ - DOSED IN MG/24 HR) 7 mg/24hr patch, Place 1 patch (7 mg total) onto the skin daily., Disp: 28 patch, Rfl: 0   ondansetron (ZOFRAN) 4 MG tablet, Take 1 tablet (4 mg total) by mouth every 8 (eight) hours as needed for refractory nausea / vomiting., Disp: 20 tablet, Rfl: 0   potassium chloride (KLOR-CON) 10 MEQ tablet, Take 30 mEq by mouth 2 (two) times daily., Disp: , Rfl:    RESTASIS MULTIDOSE  0.05 % ophthalmic emulsion, 1 drop 2 (two) times daily., Disp: , Rfl:    rosuvastatin (CRESTOR) 20 MG tablet, 1 tablet by mouth Once a day for 30 days, Disp: , Rfl:    sodium chloride 1 g tablet, Take 1 tablet (1 g total) by mouth 2 (two) times daily with a meal., Disp: 60 tablet, Rfl: 0   traZODone (DESYREL) 100 MG tablet, Take 1 tablet (100 mg total) by mouth at bedtime., Disp: 30 tablet, Rfl: 0   Review of Systems  Constitutional:  Negative for activity change, appetite change, chills, diaphoresis, fatigue, fever and unexpected weight change.  HENT:  Negative for congestion, rhinorrhea, sinus pressure, sneezing, sore throat and trouble swallowing.   Eyes:  Negative for photophobia and visual disturbance.  Respiratory:  Negative for cough, chest tightness, shortness of breath, wheezing and stridor.   Cardiovascular:  Negative for chest pain, palpitations and leg swelling.  Gastrointestinal:  Negative for abdominal distention, abdominal pain, anal bleeding, blood in stool, constipation, diarrhea, nausea and vomiting.  Genitourinary:  Positive for vaginal discharge. Negative for difficulty urinating, dysuria, flank pain and hematuria.  Musculoskeletal:  Positive for back pain. Negative for arthralgias, gait problem, joint swelling and myalgias.  Skin:  Negative for color change, pallor, rash and wound.  Neurological:  Negative for dizziness, tremors, weakness and light-headedness.  Hematological:  Negative for adenopathy. Does not bruise/bleed easily.  Psychiatric/Behavioral:  Negative for agitation, behavioral problems, confusion, decreased concentration, dysphoric mood and sleep disturbance.        Objective:   Physical Exam Constitutional:      General: She is not in acute distress.    Appearance: Normal appearance. She is well-developed. She is not ill-appearing or diaphoretic.  HENT:  Head: Normocephalic and atraumatic.     Right Ear: Hearing and external ear normal.     Left  Ear: Hearing and external ear normal.     Nose: No nasal deformity or rhinorrhea.  Eyes:     General: No scleral icterus.    Conjunctiva/sclera: Conjunctivae normal.     Right eye: Right conjunctiva is not injected.     Left eye: Left conjunctiva is not injected.     Pupils: Pupils are equal, round, and reactive to light.  Neck:     Vascular: No JVD.  Cardiovascular:     Rate and Rhythm: Normal rate and regular rhythm.     Heart sounds: S1 normal and S2 normal.  Pulmonary:     Effort: Pulmonary effort is normal. No respiratory distress.     Breath sounds: No wheezing.  Abdominal:     General: There is no distension.     Palpations: Abdomen is soft.  Musculoskeletal:        General: Normal range of motion.     Right shoulder: Normal.     Left shoulder: Normal.     Cervical back: Normal range of motion and neck supple.     Right hip: Normal.     Left hip: Normal.     Right knee: Normal.     Left knee: Normal.  Lymphadenopathy:     Head:     Right side of head: No submandibular, preauricular or posterior auricular adenopathy.     Left side of head: No submandibular, preauricular or posterior auricular adenopathy.     Cervical: No cervical adenopathy.     Right cervical: No superficial or deep cervical adenopathy.    Left cervical: No superficial or deep cervical adenopathy.  Skin:    General: Skin is warm and dry.     Coloration: Skin is not pale.     Findings: No abrasion, bruising, ecchymosis, erythema, lesion or rash.     Nails: There is no clubbing.  Neurological:     Mental Status: She is alert and oriented to person, place, and time.     Sensory: No sensory deficit.     Coordination: Coordination normal.     Gait: Gait normal.  Psychiatric:        Attention and Perception: Attention normal. She is attentive.        Mood and Affect: Mood is depressed. Affect is tearful.        Speech: Speech normal.        Behavior: Behavior normal. Behavior is cooperative.         Thought Content: Thought content normal.        Cognition and Memory: Cognition normal.        Judgment: Judgment normal.          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 - Order MRI of the spine to assess infection status and fracture healing.  Thoracic vertebral fracture (T10) T10 fracture associated with spinal infection. Pain management challenging. MRI planned to assess healing. - Order MRI of the spine to assess fracture healing. - Continue current pain management regimen.  Pain management Significant pain from spinal infection and fracture. Current regimen includes buprenorphine patches and hydrocodone. Ongoing evaluation needed. - Continue current pain management regimen. - Evaluate and adjust medications as needed.  Rectovaginal fistula Rectovaginal fistula requires surgical evaluation. Urogynecologist  appointment and consultation with Dr. Michaell Cowing scheduled. Surgery to be scheduled. - Attend urogynecology appointment. - Attend surgical consultation with Dr. Michaell Cowing. - Schedule surgery for rectovaginal fistula.  Follow-up Ongoing monitoring for spinal infection, fracture healing, and fistula management. Consider Dr. Yetta Barre at [facility] post-MRI. - Follow up on MRI results. - Coordinate with surgical team for fistula management. - Consider involving Dr. Yetta Barre at [facility] for further evaluation.

## 2023-10-05 ENCOUNTER — Encounter: Payer: Self-pay | Admitting: Infectious Disease

## 2023-10-05 ENCOUNTER — Other Ambulatory Visit: Payer: Self-pay

## 2023-10-05 ENCOUNTER — Ambulatory Visit: Admitting: Infectious Disease

## 2023-10-05 VITALS — BP 138/81 | HR 79

## 2023-10-05 DIAGNOSIS — M4626 Osteomyelitis of vertebra, lumbar region: Secondary | ICD-10-CM

## 2023-10-05 DIAGNOSIS — S32009K Unspecified fracture of unspecified lumbar vertebra, subsequent encounter for fracture with nonunion: Secondary | ICD-10-CM | POA: Diagnosis not present

## 2023-10-05 DIAGNOSIS — M4645 Discitis, unspecified, thoracolumbar region: Secondary | ICD-10-CM | POA: Diagnosis not present

## 2023-10-05 DIAGNOSIS — N823 Fistula of vagina to large intestine: Secondary | ICD-10-CM

## 2023-10-05 DIAGNOSIS — T847XXS Infection and inflammatory reaction due to other internal orthopedic prosthetic devices, implants and grafts, sequela: Secondary | ICD-10-CM

## 2023-10-05 MED ORDER — AMOXICILLIN-POT CLAVULANATE 875-125 MG PO TABS: 1.0000 | ORAL_TABLET | Freq: Two times a day (BID) | ORAL | 11 refills | Status: DC

## 2023-10-05 MED ORDER — DOXYCYCLINE HYCLATE 100 MG PO TABS: 100.0000 mg | ORAL_TABLET | Freq: Two times a day (BID) | ORAL | 11 refills | Status: DC

## 2023-10-06 ENCOUNTER — Encounter: Payer: Self-pay | Admitting: Obstetrics

## 2023-10-06 ENCOUNTER — Ambulatory Visit: Payer: Medicare Other | Admitting: Obstetrics

## 2023-10-06 VITALS — BP 119/73 | HR 76

## 2023-10-06 DIAGNOSIS — Z87898 Personal history of other specified conditions: Secondary | ICD-10-CM | POA: Diagnosis not present

## 2023-10-06 DIAGNOSIS — N823 Fistula of vagina to large intestine: Secondary | ICD-10-CM | POA: Diagnosis not present

## 2023-10-06 DIAGNOSIS — R159 Full incontinence of feces: Secondary | ICD-10-CM

## 2023-10-06 DIAGNOSIS — Z720 Tobacco use: Secondary | ICD-10-CM

## 2023-10-06 NOTE — Assessment & Plan Note (Signed)
-   continue Nicoderm and tobacco cessation - discussed increased risk of wound complication, perioperative complications and VTE with tobacco use - discussed need to stop tobacco use prior to surgery

## 2023-10-06 NOTE — Assessment & Plan Note (Signed)
-   reports symptoms prior to stooling from vagina, initially improved from stool bulking with fiber supplementation - switched to PO augmentin from PICC line antibiotics today. - history of diverticulosis - liquid stool noted on prior speculum exam, red dot noted left of vaginal apex with no clear stool extravasation or fistula tract. Noted at left upper vaginal fistula tract on prior exam. - discussed need for completion of lumbar discitis treatment and tobacco cessation decrease wound complications or sepsis with ICU admission due to history  -CT abd/pelvis w/ and w/o triple contrast 09/09/23 with possible anterior wall of sigmoid colon to vaginal cuff suspicious for fistula. Discussed colonoscopy due to clinical change and need to r/o IBD, malignancy and possible visualization of fistula tract - reviewed anatomy, discussed referral to colorectal surgery pending appt with Dr. Michaell Cowing - Treatment options include anti-diarrhea medication (loperamide/ Imodium OTC or prescription lomotil), fiber supplements, physical therapy, and possible sacral neuromodulation or surgery.   - encouraged titration of fiber supplementation to optimize stool consistency

## 2023-10-06 NOTE — Patient Instructions (Addendum)
 Continue to cut down on tobacco use  Titrate fiber supplementation for stool bulking to minimize bowel leakage.   - discussed proper vulvar care, warm compression, avoid pad use, cotton only underwear and barrier ointment if needed   Continue vaginal estrogen 1g twice a week.

## 2023-10-06 NOTE — Progress Notes (Signed)
 [facility] Return Visit  SUBJECTIVE  History of Present Illness: [name] is a 68 y.o. female seen in follow-up for rectovaginal fistula, fecal incontinence, nocturia, vaginal estrogen, history of urinary retention, vulvar ulcer, tobacco use, and mild cognitive impairment. Plan at last visit was imaging and referral to surgery.   Pending appointment with Dr. Michaell Cowing on 10/24/23 Switched from PICC line to PO Augmentin with worsening bowel symptoms.  Reports ID concerns with vaginal infection more than back infection.  Pending pulm appt, continues to use 4 cigarettes/day  Pending Lumbar spine MRI tomorrow   09/09/23: "CLINICAL DATA:  Stool coming from vagina. Fecal incontinence. Suspected rectovaginal fistula.   EXAM: CT ABDOMEN AND PELVIS WITHOUT AND WITH CONTRAST   TECHNIQUE: Multidetector CT imaging of the abdomen and pelvis was performed following the standard protocol before and following the bolus administration of intravenous contrast.   RADIATION DOSE REDUCTION: This exam was performed according to the departmental dose-optimization program which includes automated exposure control, adjustment of the mA and/or kV according to patient size and/or use of iterative reconstruction technique.   CONTRAST:  OMNIPAQUE IOHEXOL 300 MG/ML  SOLN   COMPARISON:  PET-CT on 08/10/2023   FINDINGS: Lower Chest: Multiple ill-defined ground-glass and subsolid nodular opacities are seen in the visualized portions of the right middle and lower lobes, almost certainly inflammatory or infectious in etiology, given that this is a new finding compared to recent PET-CT 1 month ago.   Hepatobiliary: No suspicious hepatic masses identified. Gallbladder is unremarkable. No evidence of biliary ductal dilatation.   Pancreas:  No mass or inflammatory changes.   Spleen: Within normal limits in size and appearance.   Adrenals/Urinary Tract: No suspicious masses identified. No  evidence of ureteral calculi or hydronephrosis. Urinary bladder is unremarkable in appearance, and no intraluminal air is seen.   Stomach/Bowel: Diffuse colonic diverticulosis is seen, with muscular hypertrophy noted in the sigmoid colon. No signs of acute diverticulitis are identified.   Significant beam hardening artifact is seen through the lower pelvis and area of the rectum due to bilateral hip prostheses and rectal tube. Rectal tube and small amount of contrast is seen in the rectosigmoid colon. No extravasated contrast is seen, however, there is a linear gas collection between the anterior wall of the sigmoid colon and bladder to the left of midline which descends to the region of the vaginal cuff (see image 83/12), highly suspicious for rectovaginal fistula.   Vascular/Lymphatic: No pathologically enlarged lymph nodes. No acute vascular findings.   Reproductive: Prior hysterectomy. Small amount of gas is seen in the vaginal cuff highly suspicious for rectovaginal fistula.   Other:  None.   Musculoskeletal: No suspicious bone lesions identified. Severe lumbar spine degenerative changes and levoscoliosis seen, with posterior spinal fixation hardware.   IMPRESSION: Image degradation in lower pelvis due to bilateral hip prostheses and rectal tube.   Findings highly suspicious for gas-containing rectovaginal fistula, as described above.   Diffuse colonic diverticulosis, with muscular hypertrophy and sigmoid colon. No radiographic evidence of acute diverticulitis.   Multiple ill-defined ground-glass and subsolid nodular opacities in the visualized portions of the right middle and lower lobes. This is almost certainly inflammatory or infectious in etiology given that it is a new compared to recent PET-CT 1 month ago. Recommend clinical correlation, and continued follow-up by chest CT.     Electronically Signed   By: Danae Orleans M.D.   On: 09/19/2023 09:11"    Past Medical  History: Patient  has a past medical history of Anemia, Anxiety, Arthritis, Bipolar disorder (HCC), Cardiomyopathy (HCC), Depression, Diskitis (10/04/2023), GERD (gastroesophageal reflux disease), Heart failure (HCC), Heart murmur, High cholesterol, History of blood transfusion, History of hiatal hernia, Hypertension, Hyperthyroidism, Hypotension (09/06/2023), Mild cognitive impairment (09/06/2018), Paroxysmal ventricular tachycardia (HCC), Rectovaginal fistula (09/06/2023), Tachycardia, unspecified, Type II diabetes mellitus (HCC), UTI (urinary tract infection), Ventricular septal defect, Ventriculitis of brain due to bacteria (11/02/2022), and Vertebral fracture, osteoporotic (HCC) (01/06/2023).   Past Surgical History: She  has a past surgical history that includes Shoulder arthroscopy (Right); Hernia repair; Exam under anesthesia with manipulation of hip (Right, 06/02/2014); Total hip arthroplasty (Right, 05/10/2014); Hip Closed Reduction (Right, 06/02/2014); Shoulder open rotator cuff repair (Right); Total knee arthroplasty (Left); Back surgery; Spinal fusion (1996); Spine hardware removal; Fracture surgery; Joint replacement; Joint replacement; Total abdominal hysterectomy; Cardiac catheterization (N/A, 06/23/2015); Cardiac catheterization (1960); Refractive surgery (Bilateral); Total shoulder arthroplasty (Left, 08/19/2016); Abdominal hysterectomy; Posterior lumbar fusion 4 level (N/A, 06/26/2022); and IR Fluoro Guide Ndl Plmt / BX (08/09/2023).   Medications: She has a current medication list which includes the following prescription(s): acetaminophen, amoxicillin-clavulanate, ascorbic acid, aspirin, buprenorphine, bupropion, carvedilol, cefepime, vitamin d3, cyclobenzaprine, daptomycin, diphenoxylate-atropine, donepezil, doxycycline, empagliflozin, estradiol, etodolac, famotidine, fenoprofen, fluvoxamine, gabapentin, hydrocodone-acetaminophen, acidophilus, lamotrigine, levothyroxine, losartan,  melatonin, metformin, multivitamin, multiple vitamins-minerals, nicotine, ondansetron, potassium chloride, restasis multidose, rosuvastatin, sodium chloride, and trazodone.   Allergies: Patient is allergic to gallium (radioactive isotope) and paxlovid [nirmatrelvir-ritonavir].   Social History: Patient  reports that she has been smoking cigarettes. She has never been exposed to tobacco smoke. She has never used smokeless tobacco. She reports that she does not drink alcohol and does not use drugs.     OBJECTIVE     Physical Exam: Vitals:   10/06/23 1447  BP: 119/73  Pulse: 76   Gen: No apparent distress, A&O x 3.  Detailed Urogynecologic Evaluation:  Deferred.    Post Void Residual - 10/06/23 1513       Post Void Residual   Post Void Residual 65 mL                 No data to display             ASSESSMENT AND PLAN    [name] is a 68 y.o. with:  1. History of urinary retention   2. Tobacco use   3. Incontinence of feces, unspecified fecal incontinence type   4. Rectovaginal fistula     History of urinary retention Assessment & Plan: - reports catheterization for 5 months managed by [facility] after spine surgery - passed void trial in the office  - ROI signed for records to review - previously catheterized for , repeat PVR 65mL. Discussd increased risk of urinary retention postop. - discussed possible relationship with increased frequency/urgency or sensation of incomplete emptying - prior POCT UA + glucose and protein only   Tobacco use Assessment & Plan: - continue Nicoderm and tobacco cessation - discussed increased risk of wound complication, perioperative complications and VTE with tobacco use - discussed need to stop tobacco use prior to surgery   Incontinence of feces, unspecified fecal incontinence type Assessment & Plan: - reports symptoms prior to stooling from vagina, initially improved from stool bulking with fiber  supplementation - switched to PO augmentin from PICC line antibiotics today. - history of diverticulosis - liquid stool noted on prior speculum exam, red dot noted left of vaginal apex with no clear stool extravasation or fistula tract. Noted  at left upper vaginal fistula tract on prior exam. - discussed need for completion of lumbar discitis treatment and tobacco cessation decrease wound complications or sepsis with ICU admission due to history  -CT abd/pelvis w/ and w/o triple contrast 09/09/23 with possible anterior wall of sigmoid colon to vaginal cuff suspicious for fistula. Discussed colonoscopy due to clinical change and need to r/o IBD, malignancy and possible visualization of fistula tract - reviewed anatomy, discussed referral to colorectal surgery pending appt with Dr. Michaell Cowing - Treatment options include anti-diarrhea medication (loperamide/ Imodium OTC or prescription lomotil), fiber supplements, physical therapy, and possible sacral neuromodulation or surgery.   - encouraged titration of fiber supplementation to optimize stool consistency   Rectovaginal fistula Assessment & Plan: - "linear gas collection between the anterior wall of the sigmoid colon and bladder to the left of midline which descends to the region of the vaginal cuff" - reviewed anatomy, discussed need for dissection of sigmoid colon off the vaginal cuff, however no vaginal repair maybe needed due to small defect  - pending referral to Dr. Michaell Cowing to determine surgical treatment for RVF   Time spent: I spent 26 minutes dedicated to the care of this patient on the date of this encounter to include pre-visit review of records, face-to-face time with the patient discussing rectovaginal fistula, fecal incontinence, tobacco use, history of urinary retention and post visit documentation.   Loleta Chance, MD

## 2023-10-06 NOTE — Assessment & Plan Note (Addendum)
-   reports catheterization for 5 months managed by [facility] after spine surgery - passed void trial in the office  - ROI signed for records to review - previously catheterized for , repeat PVR 65mL. Discussd increased risk of urinary retention postop. - discussed possible relationship with increased frequency/urgency or sensation of incomplete emptying - prior POCT UA + glucose and protein only

## 2023-10-06 NOTE — Assessment & Plan Note (Addendum)
- "  linear gas collection between the anterior wall of the sigmoid colon and bladder to the left of midline which descends to the region of the vaginal cuff" - reviewed anatomy, discussed need for dissection of sigmoid colon off the vaginal cuff, however no vaginal repair maybe needed due to small defect  - pending referral to Dr. Michaell Cowing to determine surgical treatment for RVF

## 2023-10-07 ENCOUNTER — Ambulatory Visit (HOSPITAL_COMMUNITY)
Admission: RE | Admit: 2023-10-07 | Discharge: 2023-10-07 | Disposition: A | Discharge status: Home / Self Care | Source: Ambulatory Visit | Attending: Infectious Disease | Admitting: Infectious Disease

## 2023-10-07 DIAGNOSIS — M4626 Osteomyelitis of vertebra, lumbar region: Secondary | ICD-10-CM | POA: Insufficient documentation

## 2023-10-07 DIAGNOSIS — T847XXS Infection and inflammatory reaction due to other internal orthopedic prosthetic devices, implants and grafts, sequela: Secondary | ICD-10-CM | POA: Diagnosis present

## 2023-10-07 MED ORDER — GADOBUTROL 1 MMOL/ML IV SOLN
6.0000 mL | Freq: Once | INTRAVENOUS | Status: AC | PRN
  Administered 2023-10-07: 6 mL via INTRAVENOUS

## 2023-10-14 ENCOUNTER — Telehealth: Payer: Self-pay

## 2023-10-14 ENCOUNTER — Encounter: Payer: Self-pay | Admitting: Infectious Disease

## 2023-10-14 NOTE — Telephone Encounter (Signed)
 Spoke with [name], she is still seeing Dr. Jake Samples and has an appointment with him not next week, but the next.   Relayed Dr. 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 She has additional questions about whether this scan is an overall improvement from her original imaging. Will route to provider.   Patient aware to continue antibiotics in the meantime.    Sandie Ano, RN

## 2023-10-14 NOTE — Telephone Encounter (Signed)
-----   Message from [facility] sent at 10/14/2023 11:23 AM EDT ----- Regarding: FW: Can we make sure [name] sees someone from Neurosurgery to review these films? I can call Kendell Bane Dawley but I can't remember if they had changed to someone else. The part of scan I don't understand is why she would have fluid in thecal sac or "arachnoiditis" I would have her continue her antibiotics in meantime ----- Message ----- From: Interface, Rad Results In Sent: 10/14/2023  11:06 AM EDT To: Randall Hiss, MD

## 2023-10-18 ENCOUNTER — Other Ambulatory Visit: Payer: Medicare Other

## 2023-10-18 ENCOUNTER — Encounter: Payer: Self-pay | Admitting: Infectious Disease

## 2023-10-19 ENCOUNTER — Ambulatory Visit: Payer: Medicare Other | Admitting: Internal Medicine

## 2023-10-19 ENCOUNTER — Encounter: Payer: Self-pay | Admitting: Internal Medicine

## 2023-10-19 VITALS — BP 142/76 | HR 84 | Ht 61.0 in | Wt 135.0 lb

## 2023-10-19 DIAGNOSIS — M810 Age-related osteoporosis without current pathological fracture: Secondary | ICD-10-CM | POA: Diagnosis not present

## 2023-10-19 DIAGNOSIS — R7989 Other specified abnormal findings of blood chemistry: Secondary | ICD-10-CM | POA: Diagnosis not present

## 2023-10-19 NOTE — Patient Instructions (Addendum)
 Continue Prolia for now, have a bone density in 2 years , If Prolia is helping your bones, please continue for a total of 5  years. You will need another medication to bridge the gap  such as reclast or fosamax.     Please make sure you take calcium 600 mg twice daily  Please take Vitamin D 2000 international units daily

## 2023-10-19 NOTE — Progress Notes (Unsigned)
 Name: [name]  MRN/ DOB: [id number], [birth year]    Age/ Sex: 68 y.o., female     PCP: Aliene Beams, MD   Reason for Endocrinology Evaluation: Osteoporosis     Initial Endocrinology Clinic Visit: 06/21/2023    PATIENT IDENTIFIER: [name] is a 68 y.o., female with a past medical history of scoliosis, and early menopause. She has followed with [name] since 06/21/2023 for consultative assistance with management of her Osteoporosis .   HISTORICAL SUMMARY:    Pt was diagnosed with osteoporosis:  Menopausal at age :  S/P hysterectomy  at age 53 Fracture Hx: Spontaneous T10 fracture in 2024, right femur fracture 2019 and L3 compression fracture Hx of HRT: Yes, less than 5 years FH of osteoporosis or hip fracture: yes  Prior Hx of anti-resorptive therapy : Questionable prior alendronate intake.  Received Prolia in 2024  The patient has history of scoliosis, has history of complete spinal fusion at age 40, both hips replaced by metal   She was seen by Dr. Roosevelt [name] in  06/2023, due to severe GERD they opted to start the patient on zoledronic acid.  She is on Prolia through her PCP's in 2024 , 2nd dose of 09/2023  Patient is a tobacco user No alcohol use  SUBJECTIVE:    Today (10/19/2023):  [name] is here for history of clinical osteoporosis  Patient is wheelchair-bound  Patient follows with urology for rectovaginal fistula, fecal incontinence and urinary retention Patient also follows with infectious disease due to history of spine infection with discitis, s/p antibiotics    Has chronic nausea and occasional vomiting that she attributes to Abx intake . She takes Zofran  Has severe GERD and hx of esophageal stretching  No renal stones  Has alternating constipation or  diarrhea  No glucocorticoid use    Calcium 600 mg twice daily Vitamin D 2000 units daily      HISTORY:  Past Medical History:  Past Medical History:   Diagnosis Date   Anemia    Anxiety    Arthritis    Bipolar disorder (HCC)    Cardiomyopathy (HCC)    Depression    Diskitis 10/04/2023   GERD (gastroesophageal reflux disease)    Heart failure (HCC)    Heart murmur    "related to VSD"   High cholesterol    History of blood transfusion    "related to OR" (08/19/2016)   History of hiatal hernia    Hypertension    Hyperthyroidism    Hypotension 09/06/2023   Mild cognitive impairment 09/06/2018   Paroxysmal ventricular tachycardia (HCC)    Rectovaginal fistula 09/06/2023   Tachycardia, unspecified    Type II diabetes mellitus (HCC)    UTI (urinary tract infection)    being treated with Keflex   Ventricular septal defect    Ventriculitis of brain due to bacteria 11/02/2022   Vertebral fracture, osteoporotic (HCC) 01/06/2023   Past Surgical History:  Past Surgical History:  Procedure Laterality Date   ABDOMINAL HYSTERECTOMY     BACK SURGERY     CARDIAC CATHETERIZATION N/A 06/23/2015   Procedure: Left Heart Cath and Coronary Angiography;  Surgeon: Laurey Morale, MD;  Location: [facility] INVASIVE CV LAB;  Service: Cardiovascular;  Laterality: N/A;   CARDIAC CATHETERIZATION  1960   "VSD was so small; didn't need repaired"   EXAM UNDER ANESTHESIA WITH MANIPULATION OF HIP Right 06/02/2014   dr Jerl Santos   FRACTURE SURGERY  HERNIA REPAIR     HIP CLOSED REDUCTION Right 06/02/2014   Procedure: CLOSED MANIPULATION HIP;  Surgeon: Velna Ochs, MD;  Location: [facility] OR;  Service: Orthopedics;  Laterality: Right;   IR FLUORO GUIDED NEEDLE PLC ASPIRATION/INJECTION LOC  08/09/2023   JOINT REPLACEMENT     JOINT REPLACEMENT     POSTERIOR LUMBAR FUSION 4 LEVEL N/A 06/26/2022   Procedure: Lumbar One To Lumbar Five Posterior Instrumented Fusion;  Surgeon: Jadene Pierini, MD;  Location: [facility] OR;  Service: Neurosurgery;  Laterality: N/A;   REFRACTIVE SURGERY Bilateral    SHOULDER ARTHROSCOPY Right    SHOULDER OPEN ROTATOR CUFF REPAIR Right     SPINAL FUSION  1996   "t10 down to my coccyx   SPINE HARDWARE REMOVAL     TOTAL ABDOMINAL HYSTERECTOMY     TOTAL HIP ARTHROPLASTY Right 05/10/2014   [place]      by dr Cristal Deer olcott   TOTAL KNEE ARTHROPLASTY Left    TOTAL SHOULDER ARTHROPLASTY Left 08/19/2016   Procedure: TOTAL SHOULDER ARTHROPLASTY;  Surgeon: Jones Broom, MD;  Location: [facility] OR;  Service: Orthopedics;  Laterality: Left;  Left total shoulder replacement   Social History:  reports that she has been smoking cigarettes. She has never been exposed to tobacco smoke. She has never used smokeless tobacco. She reports that she does not drink alcohol and does not use drugs. Family History:  Family History  Problem Relation Age of Onset   Other Mother        alive   Stroke Father 31       deceased   Dementia Father    Chorea Maternal Grandfather    Dementia Maternal Aunt    Dementia Maternal Aunt    Heart attack Other        multiple uncles have died with myocardial infarction   Bladder Cancer Neg Hx    Uterine cancer Neg Hx      HOME MEDICATIONS: Allergies as of 10/19/2023       Reactions   Gallium (radioactive Isotope) Swelling   Pt states she can not have radioactive injections.    Paxlovid [nirmatrelvir-ritonavir]         Medication List        Accurate as of October 19, 2023  7:17 AM. If you have any questions, ask your nurse or doctor.          acetaminophen 325 MG tablet Commonly known as: TYLENOL Take 1-2 tablets (325-650 mg total) by mouth every 6 (six) hours as needed for mild pain or headache.   Acidophilus Caps capsule Take 1 capsule (100 mg total) by mouth 2 (two) times daily.   amoxicillin-clavulanate 875-125 MG tablet Commonly known as: AUGMENTIN Take 1 tablet by mouth 2 (two) times daily.   ascorbic acid 500 MG tablet Commonly known as: VITAMIN C Take 500 mg by mouth daily.   ASPIRIN 81 PO Take 1 tablet by mouth daily.   buprenorphine 10 MCG/HR Ptwk Commonly known  as: BUTRANS Place onto the skin.   buPROPion 150 MG 24 hr tablet Commonly known as: WELLBUTRIN XL Take 1 tablet (150 mg total) by mouth daily.   carvedilol 25 MG tablet Commonly known as: COREG Take 1 tablet (25 mg total) by mouth 2 (two) times daily with a meal.   ceFEPime IVPB Commonly known as: MAXIPIME Inject 500 mg into the vein every 12 (twelve) hours.   cyclobenzaprine 10 MG tablet Commonly known as: FLEXERIL Take 10 mg by mouth  3 (three) times daily.   daptomycin IVPB Commonly known as: CUBICIN Inject 500 mg into the vein daily.   diphenoxylate-atropine 2.5-0.025 MG tablet Commonly known as: LOMOTIL Take 1 tablet by mouth 2 (two) times daily as needed for diarrhea or loose stools.   donepezil 10 MG tablet Commonly known as: ARICEPT Take 10 mg by mouth every morning.   doxycycline 100 MG tablet Commonly known as: VIBRA-TABS Take 1 tablet (100 mg total) by mouth 2 (two) times daily.   empagliflozin 10 MG Tabs tablet Commonly known as: Jardiance Take 1 tablet (10 mg total) by mouth daily before breakfast.   estradiol 0.1 MG/GM vaginal cream Commonly known as: ESTRACE Place 0.5g nightly for two weeks then twice a week after   etodolac 400 MG tablet Commonly known as: LODINE Take 400 mg by mouth every 8 (eight) hours as needed.   famotidine 20 MG tablet Commonly known as: PEPCID Take 1 tablet (20 mg total) by mouth 2 (two) times daily.   fenoprofen 600 MG Tabs tablet Commonly known as: NALFON Take 600 mg by mouth in the morning and at bedtime.   fluvoxaMINE 100 MG tablet Commonly known as: LUVOX Take 1 tablet (100 mg total) by mouth at bedtime.   gabapentin 100 MG capsule Commonly known as: NEURONTIN Take 1 capsule (100 mg total) by mouth 3 (three) times daily.   HYDROcodone-acetaminophen 5-325 MG tablet Commonly known as: NORCO/VICODIN Take 1 tablet by mouth every 4 (four) hours as needed for moderate pain (pain score 4-6).   lamoTRIgine 100 MG  tablet Commonly known as: LAMICTAL Take 200 mg by mouth at bedtime.   levothyroxine 88 MCG tablet Commonly known as: SYNTHROID Take 1 tablet (88 mcg total) by mouth daily before breakfast.   losartan 25 MG tablet Commonly known as: COZAAR Take 0.5 tablets (12.5 mg total) by mouth daily.   melatonin 5 MG Tabs Take 1 tablet (5 mg total) by mouth at bedtime as needed.   metFORMIN 500 MG tablet Commonly known as: GLUCOPHAGE Take 1 tablet (500 mg total) by mouth 2 (two) times daily with a meal.   multivitamin tablet Take 1 tablet by mouth daily.   nicotine 7 mg/24hr patch Commonly known as: NICODERM CQ - dosed in mg/24 hr Place 1 patch (7 mg total) onto the skin daily.   ondansetron 4 MG tablet Commonly known as: ZOFRAN Take 1 tablet (4 mg total) by mouth every 8 (eight) hours as needed for refractory nausea / vomiting.   potassium chloride 10 MEQ tablet Commonly known as: KLOR-CON Take 30 mEq by mouth 2 (two) times daily.   Restasis MultiDose 0.05 % ophthalmic emulsion Generic drug: cycloSPORINE 1 drop 2 (two) times daily.   rosuvastatin 20 MG tablet Commonly known as: CRESTOR 1 tablet by mouth Once a day for 30 days   sodium chloride 1 g tablet Take 1 tablet (1 g total) by mouth 2 (two) times daily with a meal.   traZODone 100 MG tablet Commonly known as: DESYREL Take 1 tablet (100 mg total) by mouth at bedtime.   Vitamin D3 50 MCG (2000 UT) capsule Take 2,000 Units by mouth daily.   ZINC PO Take 50 mg by mouth daily.          OBJECTIVE:   PHYSICAL EXAM: VS: There were no vitals taken for this visit.   EXAM: General: Pt appears well and is in NAD  Eyes: External eye exam normal without stare, lid lag or exophthalmos.  EOM  intact.    Neck: General: Supple without adenopathy. Thyroid: Thyroid size normal.  No goiter or nodules appreciated. No thyroid bruit.  Lungs: Clear with good BS bilat   Heart: Auscultation: RRR.  Abdomen: soft, nontender   Extremities:  BL LE: No pretibial edema  Mental Status: Judgment, insight: Intact Orientation: Oriented to time, place, and person Mood and affect: No depression, anxiety, or agitation     DATA REVIEWED: ***  MRI Spine 10/07/2023  Postoperative changes of T11-S1 laminectomy and posterior spinal fusion with right posterior spinal instrumentation from L2-L5. On prior CT, there is evidence of fracture through the left posterior fusion mass at L4-5, correlating with edema is seen in the left pedicle of L4 on today's study. 2. Decreased edema within the L4 and L5 vertebral bodies and posterior elements, as well as within the L4-5 and L3-4 discs, likely reactive and related to the above fracture. 3. Loculated simple fluid within the thecal sac with some marginalized cauda equina nerve roots, most consistent with arachnoiditis. 4. No spinal canal stenosis or neural foraminal narrowing.  ASSESSMENT / PLAN / RECOMMENDATIONS:   Osteoporosis :  Plan: ***    Medications   ***   Signed electronically by: Lyndle Herrlich, MD  [facility] Endocrinology  [facility] [address] [place], [place] [address] Phone: [phone] FAX: [phone]      CC: Aliene Beams, MD [phone] [address] [place] [address] Phone: [phone]  Fax: [phone]   Return to Endocrinology clinic as below: Future Appointments  Date Time Provider Department Center  10/19/2023 11:30 AM Sherlyn Ebbert, Konrad Dolores, MD [facility] None  10/20/2023 11:40 AM Fanny Dance, MD [facility] CPR  11/09/2023  2:00 PM Daiva Eves, Lisette Grinder, MD [facility]  11/10/2023 11:40 AM Jake Bathe, MD [facility]  02/08/2024 10:00 AM GI-315 CT 2 [id number] [facility]

## 2023-10-20 ENCOUNTER — Other Ambulatory Visit: Payer: Self-pay | Admitting: Family Medicine

## 2023-10-20 ENCOUNTER — Encounter: Payer: Medicare Other | Attending: Physical Medicine & Rehabilitation | Admitting: Physical Medicine & Rehabilitation

## 2023-10-20 ENCOUNTER — Encounter: Payer: Self-pay | Admitting: Internal Medicine

## 2023-10-20 ENCOUNTER — Encounter: Payer: Self-pay | Admitting: Physical Medicine & Rehabilitation

## 2023-10-20 VITALS — BP 138/83 | HR 73 | Ht 61.0 in | Wt 136.0 lb

## 2023-10-20 DIAGNOSIS — M546 Pain in thoracic spine: Secondary | ICD-10-CM | POA: Diagnosis present

## 2023-10-20 DIAGNOSIS — M4626 Osteomyelitis of vertebra, lumbar region: Secondary | ICD-10-CM | POA: Insufficient documentation

## 2023-10-20 DIAGNOSIS — G8929 Other chronic pain: Secondary | ICD-10-CM | POA: Insufficient documentation

## 2023-10-20 DIAGNOSIS — M545 Low back pain, unspecified: Secondary | ICD-10-CM | POA: Insufficient documentation

## 2023-10-20 LAB — BASIC METABOLIC PANEL WITH GFR
BUN: 12 mg/dL (ref 7–25)
CO2: 24 mmol/L (ref 20–32)
Calcium: 9.7 mg/dL (ref 8.6–10.4)
Chloride: 99 mmol/L (ref 98–110)
Creat: 0.52 mg/dL (ref 0.50–1.05)
Glucose, Bld: 110 mg/dL — ABNORMAL HIGH (ref 65–99)
Potassium: 5.2 mmol/L (ref 3.5–5.3)
Sodium: 133 mmol/L — ABNORMAL LOW (ref 135–146)
eGFR: 101 mL/min/{1.73_m2} (ref >=60)

## 2023-10-20 LAB — MAGNESIUM: Magnesium: 2 mg/dL (ref 1.5–2.5)

## 2023-10-20 LAB — VITAMIN D 25 HYDROXY (VIT D DEFICIENCY, FRACTURES): Vit D, 25-Hydroxy: 50 ng/mL (ref 30–100)

## 2023-10-20 LAB — PHOSPHORUS: Phosphorus: 4.6 mg/dL — ABNORMAL HIGH (ref 2.1–4.3)

## 2023-10-20 LAB — TSH: TSH: 1.71 m[IU]/L (ref 0.40–4.50)

## 2023-10-20 LAB — PARATHYROID HORMONE, INTACT (NO CA): PTH: 38 pg/mL (ref 16–77)

## 2023-10-20 MED ORDER — NICOTINE 7 MG/24HR TD PT24: 7.0000 mg | MEDICATED_PATCH | Freq: Every day | TRANSDERMAL | 0 refills | Status: DC

## 2023-10-20 NOTE — Progress Notes (Addendum)
 Subjective:    Patient ID: [name], female    DOB: 02/13/1956, 68 y.o.   MRN: [id number]  HPI  HPI   Excerpt from discharge summary 10/13/2022 Brief HPI:   [name] is a 68 y.o. female with history of PVT, T2DM, bipolar d/o, mild cognitive impairments, right femur fracture with RLE leg length discrepancy and gait disorder, fall 05/2022 with 3 column unstable fracture through prior scoliosis construct s/p instrumentation with exposed hardware and wound dehiscence. She was admitted on 08/09/22 for revision of lumbar wound with removal of hardware and revision L1-L5 fusion by Dr. Ali Antonio. Post op course was significant for delirium with fevers, PNA with respiratory failure requiring intubation 2/01. MRI brain showed evidence of ventriculitis and MRI spine showed widely patent fusion with decrease in bone marrow edema L3.    Dr. Ernie Heal recommended IV antibiotics X 6 weeks empirically with flagyl  to cover aspiration PNA. She required tracheostomy as well as foley for urinary retention and rectal tube due to frequent loose stools and to prevent wound contamination. WOC was following for wound VAC changes. She was downsized to CFS#4 prior prior to d/c. PT/OT was working with patient who was limited by weakness, cognitive deficits requiring increased time for processing as well as verbal and tactile cues to follow simple commands as well as max to total assist with ADLs and mobility. CIR was recommended due to functional decline.       Hospital Course: [name] was admitted to rehab 09/03/2022 for inpatient therapies to consist of PT, ST and OT at least three hours five days a week. Past admission physiatrist, therapy team and rehab RN have worked together to provide customized collaborative inpatient rehab. Her blood pressures were monitored on TID basis and have been stable. As mentation and swallow function improved, diet was advanced to regular textures. She was  tolerating PMSV and was decannulated without difficulty. Her diabetes has been monitored with ac/hs CBG checks and was being managed with SSI due to variable intake and was reasonably controlled. As swallow function improved, she was advanced to regular textures, intake has improved with family providing food from home. She did request resumption of metformin  and d/c of CBG checks. She is tolerating metformin  without SE except for am nausea which could be due to transition to oral antibiotics. Patient has been educated on importance of BS management to promote wound healing and recommended to  monitor this post d/c. Her CBG checks d/c as BS controlled and have been monitored weekly with weekly labs.    She was maintained on Vanc/cefepime  thru 03/14 and as follow up MRI brain 03/13 showed  resolution of ventriculitis, she was transitioned to Levaquin  and doxycycline  bid per ID input. She is to continue on oral antibiotics X 4 weeks and follow up at [facility] on 04/16 for final decision on duration of antibiotic treatment. WOC has been following to give input on patient's wound care and for monitoring. Wound VAC was discontinued on 02/21 and wet to dry dressing changes with Aquacel Ag was initiated. She did develop some slough on wound bed and metahoney added initially but changed to santyl  which has helped to debride it. Patient did report that she plans  on showering at home and WOC recommended changing back to Aquacel for antibacterial protection.    Ensure and protein supplements added to help promote wound healing as as mentation improved but she had declined these frequently.  Voiding trial was attempted with  increase in tamsulosin  as well as addition of urecholine . She continued to require I/O caths and patient elected on  foley with plans for voiding trial on outpatient basis.  She has had high levels of anxiety and ego support has been provided by rehab team. Her mentation has improved with cognition trending  back to baseline. She expressed concerns about her psychiatric medication regimen as multiple meds has been d/c while on acute and requested psychiatry input. Her home Wellbutrin  was resumed on 03/21 and Dr. Bethanie Brooking who evaluated patient felt that patient was at her baseline and no additional meds were recommended.    Pain control is improving and she has been educated on importance of weaning narcotics gradually. She continues Butran's patch as well as low dose gabapentin  as sedation was reported with attempts at up titration of latter.   She started making progress and LOS was extended by a week to help achieve higher goals. She requires min assist with ADL and mobility. She will continue to receive follow up HHPT, HHOT, HHST, HHaide and HHRN by [facility] after discharge.      Rehab course: During patient's stay in rehab weekly team conferences were held to monitor patient's progress, set goals and discuss barriers to discharge. At admission, patient required +2 total assist with ADL tasks and mod to total assist with pregait activity. She exhibited deficits in recall, attention, insight and required min to mod assist for processing and attention to tasks. She  has had improvement in activity tolerance, balance, postural control as well as ability to compensate for deficits. She has had improvement in functional use RUE and RLE as well as improvement in awareness. She requires min assist with ADL tasks.  She requires CGA for transfers and to ambulate 42 feet with RW and cues for sequencing.  She is able to complete functional cognitive tasks with min assist and requires min cues to self correct errors and for re-direction to tasks. Family education has been completed and family has hired caregivers to assist with care.     Interval history 12/10/2022 [name] is here for follow-up after CIR and above treatment.  She was also seen by Ford Ide on 11/09/2022.  She reports she is continue to  work with physical therapy however she was discharged by OT.  She says after several weeks she may be able to transition to outpatient therapy.  Foley has been removed and she is voiding on her own, this does require to get up more frequently.  Her back pain is currently controlled with gabapentin , buprenorphine  that was increased to 10 mcg/h patch and as needed oxycodone .  She has been seen by Dr. Ernie Heal infectious disease and is continued on antibiotics of doxycycline  and Levaquin .  She continues to follow-up with Dr. Dorothy Gates.  She had a CT of her T-spine and L-spine recently that showed a new compression fracture.   Interval history 03/15/2023 [name] is here for follow-up regarding conditions listed above.  She reports she is doing well overall.  She continues to work with physical therapy and Occupational Therapy and feels like she is making good progress.  She says she is discharged from her wound care clinic because her lower back wound is now closed.  She is no longer on tramadol , buprenorphine  or oxycodone  and is now on Flexeril  for her back pain.  She feels like therapy is causing some soreness in her back.  Pain does not shoot down her legs.  Reports  she has a TENS and this provides minimal benefit.   Interval history 06/30/23 Patient is here for follow-up.  She reports she was doing very well with outpatient PT and OT and making good progress.  She later developed worsening thoracic pain and was found to have a T10 compression fracture.  She reports Dr. Dorothy Gates has retired but she is planning to follow-up with Dr. Julane Ny. She has been treated with Norco 5.  She reports she would like to restart PT after she has her potential kyphoplasty.  Interval history 10/20/23 Patient reports that she continues to have pain and issues with her lower back.  She continues to follow-up with ID for vertebral osteomyelitis.  She remains on IV antibiotics but it sounds like infectious disease planning to  switch her to oral medications.  She also reports rectovaginal fistula.  Patient been seen by Dr. Willette Harps with neurosurgery at [facility].  She reports surgeons do not want to do any additional operations due to spinal infection.  She continues to have severe lower back pain.  Pain overall has been controlled with Norco 5 mg and buprenorphine  10 mcg/h.  This she also takes gabapentin  100 mg 3 times daily.  Pain Inventory Average Pain 4 Pain Right Now 7 My pain is constant, sharp, burning, and aching  In the last 24 hours, has pain interfered with the following? General activity 10 Relation with others 7 Enjoyment of life 10 What TIME of day is your pain at its worst? morning  and evening Sleep (in general) Poor  Pain is worse with: bending, sitting, and some activites Pain improves with: rest, heat/ice, and medication Relief from Meds: 8  Family History  Problem Relation Age of Onset   Other Mother        alive   Stroke Father 84       deceased   Dementia Father    Chorea Maternal Grandfather    Dementia Maternal Aunt    Dementia Maternal Aunt    Heart attack Other        multiple uncles have died with myocardial infarction   Bladder Cancer Neg Hx    Uterine cancer Neg Hx    Social History   Socioeconomic History   Marital status: Married    Spouse name: Not on file   Number of children: Not on file   Years of education: Not on file   Highest education level: Master's degree (e.g., MA, MS, MEng, MEd, MSW, MBA)  Occupational History   Occupation: Magazine features editor: SELF-EMPLOYED    Comment: former  Tobacco Use   Smoking status: Every Day    Types: Cigarettes    Passive exposure: Never   Smokeless tobacco: Never   Tobacco comments:    03/30/21 smokes 2 cigs daily.      08/06/22 Camilo Cella stated that the patient is not smoking while at [facility] for rehab. [facility]  Vaping Use   Vaping status: Never Used  Substance and Sexual Activity   Alcohol use: No   Drug use:  No   Sexual activity: Not Currently    Partners: Male    Birth control/protection: Surgical    Comment: Hysterectomy  Other Topics Concern   Not on file  Social History Narrative   Right handed   Caffeine  2-3 cups daily    Lives at home with husband    Social Drivers of Health   Financial Resource Strain: Low Risk  (07/08/2023)   Received from  [facility]   Overall Financial Resource Strain (CARDIA)    Difficulty of Paying Living Expenses: Not hard at all  Food Insecurity: No Food Insecurity (07/08/2023)   Received from [facility]   Hunger Vital Sign    Worried About Running Out of Food in the Last Year: Never true    Ran Out of Food in the Last Year: Never true  Transportation Needs: No Transportation Needs (07/08/2023)   [facility] - Transportation    In the past 12 months, has lack of transportation kept you from medical appointments or from getting medications?: No    Lack of Transportation (Non-Medical): No  Physical Activity: Not on file  Stress: Not on file  Social Connections: Not on file   Past Surgical History:  Procedure Laterality Date   ABDOMINAL HYSTERECTOMY     BACK SURGERY     CARDIAC CATHETERIZATION N/A 06/23/2015   Procedure: Left Heart Cath and Coronary Angiography;  Surgeon: Darlis Eisenmenger, MD;  Location: [facility] INVASIVE CV LAB;  Service: Cardiovascular;  Laterality: N/A;   CARDIAC CATHETERIZATION  1960   VSD was so small; didn't need repaired   EXAM UNDER ANESTHESIA WITH MANIPULATION OF HIP Right 06/02/2014   dr Ana Balling   FRACTURE SURGERY     HERNIA REPAIR     HIP CLOSED REDUCTION Right 06/02/2014   Procedure: CLOSED MANIPULATION HIP;  Surgeon: Alphonzo Ask, MD;  Location: [facility] OR;  Service: Orthopedics;  Laterality: Right;   IR FLUORO GUIDED NEEDLE PLC ASPIRATION/INJECTION LOC  08/09/2023   JOINT REPLACEMENT     JOINT REPLACEMENT     POSTERIOR LUMBAR FUSION 4 LEVEL  N/A 06/26/2022   Procedure: Lumbar One To Lumbar Five Posterior Instrumented Fusion;  Surgeon: Cannon Champion, MD;  Location: [facility] OR;  Service: Neurosurgery;  Laterality: N/A;   REFRACTIVE SURGERY Bilateral    SHOULDER ARTHROSCOPY Right    SHOULDER OPEN ROTATOR CUFF REPAIR Right    SPINAL FUSION  1996   t10 down to my coccyx   SPINE HARDWARE REMOVAL     TOTAL ABDOMINAL HYSTERECTOMY     TOTAL HIP ARTHROPLASTY Right 05/10/2014   [facility]      by dr Veryl Gottron olcott   TOTAL KNEE ARTHROPLASTY Left    TOTAL SHOULDER ARTHROPLASTY Left 08/19/2016   Procedure: TOTAL SHOULDER ARTHROPLASTY;  Surgeon: Sammye Cristal, MD;  Location: [facility] OR;  Service: Orthopedics;  Laterality: Left;  Left total shoulder replacement   Past Surgical History:  Procedure Laterality Date   ABDOMINAL HYSTERECTOMY     BACK SURGERY     CARDIAC CATHETERIZATION N/A 06/23/2015   Procedure: Left Heart Cath and Coronary Angiography;  Surgeon: Darlis Eisenmenger, MD;  Location: [facility] INVASIVE CV LAB;  Service: Cardiovascular;  Laterality: N/A;   CARDIAC CATHETERIZATION  1960   VSD was so small; didn't need repaired   EXAM UNDER ANESTHESIA WITH MANIPULATION OF HIP Right 06/02/2014   dr Ana Balling   FRACTURE SURGERY     HERNIA REPAIR     HIP CLOSED REDUCTION Right 06/02/2014   Procedure: CLOSED MANIPULATION HIP;  Surgeon: Alphonzo Ask, MD;  Location: [facility] OR;  Service: Orthopedics;  Laterality: Right;   IR FLUORO GUIDED NEEDLE PLC ASPIRATION/INJECTION LOC  08/09/2023   JOINT REPLACEMENT     JOINT REPLACEMENT     POSTERIOR LUMBAR FUSION 4 LEVEL N/A 06/26/2022   Procedure: Lumbar One To Lumbar Five Posterior Instrumented Fusion;  Surgeon:  Cannon Champion, MD;  Location: [facility] OR;  Service: Neurosurgery;  Laterality: N/A;   REFRACTIVE SURGERY Bilateral    SHOULDER ARTHROSCOPY Right    SHOULDER OPEN ROTATOR CUFF REPAIR Right    SPINAL FUSION  1996   t10 down to my coccyx   SPINE HARDWARE REMOVAL     TOTAL ABDOMINAL  HYSTERECTOMY     TOTAL HIP ARTHROPLASTY Right 05/10/2014   [facility]      by dr Veryl Gottron olcott   TOTAL KNEE ARTHROPLASTY Left    TOTAL SHOULDER ARTHROPLASTY Left 08/19/2016   Procedure: TOTAL SHOULDER ARTHROPLASTY;  Surgeon: Sammye Cristal, MD;  Location: [facility] OR;  Service: Orthopedics;  Laterality: Left;  Left total shoulder replacement   Past Medical History:  Diagnosis Date   Anemia    Anxiety    Arthritis    Bipolar disorder (HCC)    Cardiomyopathy (HCC)    Depression    Diskitis 10/04/2023   GERD (gastroesophageal reflux disease)    Heart failure (HCC)    Heart murmur    related to VSD   High cholesterol    History of blood transfusion    related to OR (08/19/2016)   History of hiatal hernia    Hypertension    Hyperthyroidism    Hypotension 09/06/2023   Mild cognitive impairment 09/06/2018   Paroxysmal ventricular tachycardia (HCC)    Rectovaginal fistula 09/06/2023   Tachycardia, unspecified    Type II diabetes mellitus (HCC)    UTI (urinary tract infection)    being treated with Keflex    Ventricular septal defect    Ventriculitis of brain due to bacteria 11/02/2022   Vertebral fracture, osteoporotic (HCC) 01/06/2023   BP 138/83   Pulse 73   Ht 5' 1 (1.549 m)   Wt 136 lb (61.7 kg)   SpO2 97%   BMI 25.70 kg/m   Opioid Risk Score:   Fall Risk Score:  `1  Depression screen [facility] 2/9     09/19/2023   10:18 AM 09/07/2023    9:09 AM 06/30/2023   11:55 AM 03/15/2023   10:54 AM 01/06/2023   10:36 AM 12/10/2022   11:13 AM 12/09/2022   10:42 AM  Depression screen PHQ 2/9  Decreased Interest 0 1 1 1  0 0 0  Down, Depressed, Hopeless 1 0 1 1 1  0 0  PHQ - 2 Score 1 1 2 2 1  0 0     Review of Systems  Musculoskeletal:  Positive for back pain.       RT hip  All other systems reviewed and are negative.      Objective:   Physical Exam   Gen: no distress, normal appearing HEENT: oral mucosa pink and moist, NCAT Chest: normal effort, normal rate of  breathing Abd: soft, non-distended Ext: no edema Psych: pleasant, normal affect Skin: Warm dry Neuro: Alert and awake, follows commands, cranial 2-12 grossly intact Sitting in wheel chair, moving all 4 extremities to gravity and resistance Sensation intact light touch in all 4 extremities Musculoskeletal: TTP lower T spine and  L spine   CT lumbar spine 12-25-2022 IMPRESSION: 1. Pronounced chronic osteopenia. Prior decompression and fusion throughout the lumbar spine.   2. L3 superior endplate/superior body non-acute fracture AND subtle associated horizontal fracture through the chronic posterior element bone mass there (series 9, image 53 today). But no displacement over this series of exams.   3. Difficult to exclude nondisplaced S3/S4 sacral fracture, with mild presacral stranding there.   4. But  no other acute osseous abnormality identified. And elsewhere the chronic T12 through sacral ankylosis/arthrodesis appears intact.   5. T10 compression fracture is detailed separately today on CT Thoracic Spine.   We discussed getting stay CT thoracic spine 12/01/2022 IMPRESSION: 1. T10 compression fracture with 12% loss of height is new since 08/19/2022. No retropulsion or other complicating features. Unchanged adjacent chronic T10-T11 and T12-L1 posterior element ankylosis, with chronic non fusion of T11-T12. 2. No other acute osseous abnormality identified in the thoracic spine. Generalized osteopenia. Chronic mild T5, T6, and T11 compression fractures. 3. Increased elevation of the left hemidiaphragm and new left lung base consolidation since February. But small bilateral pleural effusions have resolved. 4. No CT evidence of thoracic spinal stenosis.   L spine MRI 10/07/23 IMPRESSION: 1. Postoperative changes of T11-S1 laminectomy and posterior spinal fusion with right posterior spinal instrumentation from L2-L5. On prior CT, there is evidence of fracture through the left  posterior fusion mass at L4-5, correlating with edema is seen in the left pedicle of L4 on today's study. 2. Decreased edema within the L4 and L5 vertebral bodies and posterior elements, as well as within the L4-5 and L3-4 discs, likely reactive and related to the above fracture. 3. Loculated simple fluid within the thecal sac with some marginalized cauda equina nerve roots, most consistent with arachnoiditis. 4. No spinal canal stenosis or neural foraminal narrowing.       Assessment & Plan:   Osteomyelitis, infection of lumbar surgical site complicated by meningitis/ventriculitis.              -Continue to follow-up with NSGY as directed             -Continue to follow-up with infectious disease as directed  Chronic thoracic and lower back pain. Complex scoliosis   -History of T11-S1 laminectomy and posterior spinal fusion, noted to have fracture to the left posterior fusion mass at L4-5, patient has had several compression fractures             -She is previously on buprenorphine , oxycodone -reports feeling good control with current regimen  -Patient seen by [facility] neurosurgery for second opinion  -Patient reports she has been referred to pain management clinic, asked if she could do pain management in our clinic.  Discussed that this would be an option.  Patient says she wants to talk to her neurosurgeon first.  Will follow-up in a month  -Opiate risk tool moderate  01/05/24 Asking for referral to neuro PT as haivng more difficult with balance , ordered

## 2023-10-20 NOTE — Telephone Encounter (Signed)
 Copied from CRM [id number]. Topic: Clinical - Medication Refill >> Oct 20, 2023  1:18 PM [name] wrote: Most Recent Primary Care Visit:   Medication: nicotine (NICODERM CQ - DOSED IN MG/24 HR) 7 mg/24hr patch  Has the patient contacted their pharmacy? No (She needs sent to a new pharmacy  Is this the correct pharmacy for this prescription? No If no, delete pharmacy and type the correct one.  This is the patient's preferred pharmacy:   [facility] STORE #[id number], [place] - [address] AT [facility] OF [address] [place] [address] Phone: [phone] Fax: [phone]   Has the prescription been filled recently? Yes  Is the patient out of the medication? Yes  Has the patient been seen for an appointment in the last year OR does the patient have an upcoming appointment? Yes  Can we respond through MyChart? Yes  Agent: Please be advised that Rx refills may take up to 3 business days. We ask that you follow-up with your pharmacy.

## 2023-10-24 ENCOUNTER — Ambulatory Visit: Payer: Self-pay | Admitting: Surgery

## 2023-10-24 DIAGNOSIS — R739 Hyperglycemia, unspecified: Secondary | ICD-10-CM

## 2023-10-24 DIAGNOSIS — N824 Other female intestinal-genital tract fistulae: Secondary | ICD-10-CM

## 2023-10-25 ENCOUNTER — Telehealth: Payer: Self-pay | Admitting: *Deleted

## 2023-10-25 NOTE — Telephone Encounter (Signed)
 I s/w Ethlyn Gallery, CMA in regards to preop clearance. I reviewed preop APP question is the urgency of surgery  as the pt has an appt 11/10/23 with Dr. Anne Fu. Per surgery scheduler Dr. Michaell Cowing was hoping to get a letter back this week so that can get the pt on his surgery schedule which is filling up, and surgery is urgent.   I thanked [name] for her help and I will forward back to preop APP.

## 2023-10-25 NOTE — Telephone Encounter (Signed)
   Name: [name]  DOB: 05-18-1956  MRN: [id number]  Primary Cardiologist: Donato Schultz, MD   Preoperative team, please contact this patient and set up a phone call appointment for further preoperative risk assessment. Please obtain consent and complete medication review. Thank you for your help.  I confirm that guidance regarding antiplatelet and oral anticoagulation therapy has been completed and, if necessary, noted below.  Per office protocol, if patient is without any new symptoms or concerns at the time of their virtual visit, she may hold Aspirin for 5-7 days prior to procedure. Please resume Aspirin as soon as possible postprocedure, at the discretion of the surgeon.    Jardiance should be held three days prior to surgery.   I also confirmed the patient resides in the state of [place]. As per [facility] Medical Board telemedicine laws, the patient must reside in the state in which the provider is licensed.   Denyce Robert, NP 10/25/2023, 2:37 PM [facility]

## 2023-10-25 NOTE — Telephone Encounter (Signed)
 Patient has been scheduled for in person appointment patient wanted to come in sooner than her scheduled appt on 4/24

## 2023-10-25 NOTE — Telephone Encounter (Signed)
   Pre-operative Risk Assessment    Patient Name: [name]  DOB: 12/09/1955 MRN: [id number]   Date of last office visit: 09/06/23 [name], [facility] Date of next office visit: 11/10/23 [name] [name], [name] 2-3 MONTH F/U   Request for Surgical Clearance    Procedure:   URGENT ROBOTIC RESECTION OF COLON TO REPAIR COLOVAGINAL FISTULA  Date of Surgery:  Clearance TBD URGENT                               Surgeon:  [name] Surgeon's Group or Practice Name:  [facility] Phone number:  [phone] Fax number:  [phone] [name], [name]   Type of Clearance Requested:   - Medical  - Pharmacy:  Hold Aspirin     Type of Anesthesia:  General    Additional requests/questions:    [name]   10/25/2023, 10:22 AM

## 2023-10-31 ENCOUNTER — Telehealth: Payer: Self-pay | Admitting: *Deleted

## 2023-10-31 NOTE — Telephone Encounter (Signed)
 [name], [facility] sent me a secure chat in regard to appt in office 11/04/23 though appt recommendations from preop APP states to schedule a tele appt.   I called the pt and she stated that she requested to be seen in the office, though as of today, said she will take the tele appt instead. My only tele appt was 11/03/23, which pt agreed too.

## 2023-10-31 NOTE — Telephone Encounter (Signed)
 [name], [facility] sent me a secure chat in regard to appt in office 11/04/23 though appt recommendations from preop APP states to schedule a tele appt.   I called the pt and she stated that she requested to be seen in the office, though as of today, said she will take the tele appt instead. My only tele appt was 11/03/23, which pt agreed too.     Patient Consent for Virtual Visit        [name] has provided verbal consent on 10/31/2023 for a virtual visit (video or telephone).   CONSENT FOR VIRTUAL VISIT FOR:  [name]  By participating in this virtual visit I agree to the following:  I hereby voluntarily request, consent and authorize [facility] and its employed or contracted physicians, physician assistants, nurse practitioners or other licensed health care professionals (the Practitioner), to provide me with telemedicine health care services (the "Services") as deemed necessary by the treating Practitioner. I acknowledge and consent to receive the Services by the Practitioner via telemedicine. I understand that the telemedicine visit will involve communicating with the Practitioner through live audiovisual communication technology and the disclosure of certain medical information by electronic transmission. I acknowledge that I have been given the opportunity to request an in-person assessment or other available alternative prior to the telemedicine visit and am voluntarily participating in the telemedicine visit.  I understand that I have the right to withhold or withdraw my consent to the use of telemedicine in the course of my care at any time, without affecting my right to future care or treatment, and that the Practitioner or I may terminate the telemedicine visit at any time. I understand that I have the right to inspect all information obtained and/or recorded in the course of the telemedicine visit and may receive copies of available information for a  reasonable fee.  I understand that some of the potential risks of receiving the Services via telemedicine include:  Delay or interruption in medical evaluation due to technological equipment failure or disruption; Information transmitted may not be sufficient (e.g. poor resolution of images) to allow for appropriate medical decision making by the Practitioner; and/or  In rare instances, security protocols could fail, causing a breach of personal health information.  Furthermore, I acknowledge that it is my responsibility to provide information about my medical history, conditions and care that is complete and accurate to the best of my ability. I acknowledge that Practitioner's advice, recommendations, and/or decision may be based on factors not within their control, such as incomplete or inaccurate data provided by me or distortions of diagnostic images or specimens that may result from electronic transmissions. I understand that the practice of medicine is not an exact science and that Practitioner makes no warranties or guarantees regarding treatment outcomes. I acknowledge that a copy of this consent can be made available to me via my patient portal [facility] MyChart), or I can request a printed copy by calling the office of [facility].    I understand that my insurance will be billed for this visit.   I have read or had this consent read to me. I understand the contents of this consent, which adequately explains the benefits and risks of the Services being provided via telemedicine.  I have been provided ample opportunity to ask questions regarding this consent and the Services and have had my questions answered to my satisfaction. I give my informed consent for the services to be  provided through the use of telemedicine in my medical care

## 2023-11-03 ENCOUNTER — Ambulatory Visit: Attending: Nurse Practitioner

## 2023-11-03 DIAGNOSIS — Z01810 Encounter for preprocedural cardiovascular examination: Secondary | ICD-10-CM | POA: Diagnosis not present

## 2023-11-03 NOTE — Progress Notes (Signed)
 Virtual Visit via Telephone Note   Because of [name] co-morbid illnesses, she is at least at moderate risk for complications without adequate follow up.  This format is felt to be most appropriate for this patient at this time.  Due to technical limitations with video connection Web designer), today's appointment will be conducted as an audio only telehealth visit, and [name] verbally agreed to proceed in this manner.   All issues noted in this document were discussed and addressed.  No physical exam could be performed with this format.  Evaluation Performed:  Preoperative cardiovascular risk assessment _____________   Date:  11/03/2023   Patient ID:  [name], DOB Jan 19, 1956, MRN [id number] Patient Location:  Home Provider location:   Office  Primary Care Provider:  Aliene Beams, MD Primary Cardiologist:  Donato Schultz, MD  Chief Complaint / Patient Profile   68 y.o. y/o female with a h/o chronic combined CHF, VSD, HLD, HTN, nonobstructive CAD, paroxysmal VT s/p VT ablation in early 90's who is pending robotic colon resection for repair of colovaginal fistula and presents today for telephonic preoperative cardiovascular risk assessment.  History of Present Illness    [name] is a 68 y.o. female who presents via audio/video conferencing for a telehealth visit today.  Pt was last seen in cardiology clinic on 09/06/2023 by Marissa Carson, PA.  At that time [name] was doing well had some low BP after taking medication on empty stomach.  Patient was being treated for osteomyelitis with antibiotics causing increased palpitations. She felt better with IV fluids and losartan was decreased to 12.5 mg in the setting of hypotension. The patient is now pending procedure as outlined above. Since her last visit, she has not experienced any chest pain, shortness of breath or dizziness.  She reports that her blood pressures are stable and have  been in the 130s over 80s at home.  She has stopped smoking and has been abstinent for the past 6 weeks.  She is able to complete 4 METS of activity with some difficulty due to her low back injury.  She denies chest pain, shortness of breath, lower extremity edema, fatigue, palpitations, melena, hematuria, hemoptysis, diaphoresis, weakness, presyncope, syncope, orthopnea, and PND.    Past Medical History    Past Medical History:  Diagnosis Date   Anemia    Anxiety    Arthritis    Bipolar disorder (HCC)    Cardiomyopathy (HCC)    Depression    Diskitis 10/04/2023   GERD (gastroesophageal reflux disease)    Heart failure (HCC)    Heart murmur    "related to VSD"   High cholesterol    History of blood transfusion    "related to OR" (08/19/2016)   History of hiatal hernia    Hypertension    Hyperthyroidism    Hypotension 09/06/2023   Mild cognitive impairment 09/06/2018   Paroxysmal ventricular tachycardia (HCC)    Rectovaginal fistula 09/06/2023   Tachycardia, unspecified    Type II diabetes mellitus (HCC)    UTI (urinary tract infection)    being treated with Keflex   Ventricular septal defect    Ventriculitis of brain due to bacteria 11/02/2022   Vertebral fracture, osteoporotic (HCC) 01/06/2023   Past Surgical History:  Procedure Laterality Date   ABDOMINAL HYSTERECTOMY     BACK SURGERY     CARDIAC CATHETERIZATION N/A 06/23/2015   Procedure: Left Heart Cath and Coronary Angiography;  Surgeon: Laurey Morale,  MD;  Location: [facility] INV[facility];  Service: Cardiovascular;  Laterality: N/A;   CARDIAC CATHETERIZATION  1960   "VSD was so small; didn't need repaired"   EXAM UNDER ANESTHESIA WITH MANIPULATION OF HIP Right 06/02/2014   dr Ana Balling   FRACTURE SURGERY     HERNIA REPAIR     HIP CLOSED REDUCTION Right 06/02/2014   Procedure: CLOSED MANIPULATION HIP;  Surgeon: Alphonzo Ask, MD;  Location: [facility] OR;  Service: Orthopedics;  Laterality: Right;   IR FLUORO GUIDED  NEEDLE PLC ASPIRATION/INJECTION LOC  08/09/2023   JOINT REPLACEMENT     JOINT REPLACEMENT     POSTERIOR LUMBAR FUSION 4 LEVEL N/A 06/26/2022   Procedure: Lumbar One To Lumbar Five Posterior Instrumented Fusion;  Surgeon: Cannon Champion, MD;  Location: [facility] OR;  Service: Neurosurgery;  Laterality: N/A;   REFRACTIVE SURGERY Bilateral    SHOULDER ARTHROSCOPY Right    SHOULDER OPEN ROTATOR CUFF REPAIR Right    SPINAL FUSION  1996   "t10 down to my coccyx   SPINE HARDWARE REMOVAL     TOTAL ABDOMINAL HYSTERECTOMY     TOTAL HIP ARTHROPLASTY Right 05/10/2014   [place]      by dr Veryl Gottron olcott   TOTAL KNEE ARTHROPLASTY Left    TOTAL SHOULDER ARTHROPLASTY Left 08/19/2016   Procedure: TOTAL SHOULDER ARTHROPLASTY;  Surgeon: Sammye Cristal, MD;  Location: [facility] OR;  Service: Orthopedics;  Laterality: Left;  Left total shoulder replacement    Allergies  Allergies  Allergen Reactions   Gallium (Radioactive Isotope) Swelling    Pt states she can not have radioactive injections.    Paxlovid [Nirmatrelvir-Ritonavir]     Home Medications    Prior to Admission medications   Medication Sig Start Date End Date Taking? Authorizing Provider  acetaminophen (TYLENOL) 325 MG tablet Take 1-2 tablets (325-650 mg total) by mouth every 6 (six) hours as needed for mild pain or headache. 10/12/22   Love, Renay Carota, PA-C  amoxicillin-clavulanate (AUGMENTIN) 875-125 MG tablet Take 1 tablet by mouth 2 (two) times daily. 10/05/23   Charolette Copier, MD  ascorbic acid (VITAMIN C) 500 MG tablet Take 500 mg by mouth daily.    [provider]  ASPIRIN 81 PO Take 1 tablet by mouth daily.    [provider]  buPROPion (WELLBUTRIN XL) 150 MG 24 hr tablet Take 1 tablet (150 mg total) by mouth daily. 11/09/22   Jodi Munroe, NP  carvedilol (COREG) 25 MG tablet Take 1 tablet (25 mg total) by mouth 2 (two) times daily with a meal. 10/12/22   Love, Renay Carota, PA-C  Cholecalciferol (VITAMIN D3) 50  MCG (2000 UT) capsule Take 2,000 Units by mouth daily.    [provider]  cyclobenzaprine (FLEXERIL) 10 MG tablet Take 10 mg by mouth 3 (three) times daily. 06/08/23   [provider]  diphenoxylate-atropine (LOMOTIL) 2.5-0.025 MG tablet Take 1 tablet by mouth 2 (two) times daily as needed for diarrhea or loose stools. 10/12/22   Love, Renay Carota, PA-C  donepezil (ARICEPT) 10 MG tablet Take 10 mg by mouth every morning. 04/07/23   [provider]  doxycycline (VIBRA-TABS) 100 MG tablet Take 1 tablet (100 mg total) by mouth 2 (two) times daily. 10/05/23   Charolette Copier, MD  empagliflozin (JARDIANCE) 10 MG TABS tablet Take 1 tablet (10 mg total) by mouth daily before breakfast. 02/10/23   Williams, Evan, PA-C  estradiol (ESTRACE) 0.1 MG/GM vaginal cream Place 0.5g  nightly for two weeks then twice a week after 09/08/23   Wyonia Hefty T, MD  famotidine (PEPCID) 20 MG tablet Take 1 tablet (20 mg total) by mouth 2 (two) times daily. 10/12/22   Love, Renay Carota, PA-C  fenoprofen (NALFON) 600 MG TABS tablet Take 600 mg by mouth in the morning and at bedtime.    [provider]  fluvoxaMINE (LUVOX) 100 MG tablet Take 1 tablet (100 mg total) by mouth at bedtime. 10/13/22   Love, Renay Carota, PA-C  gabapentin (NEURONTIN) 100 MG capsule Take 1 capsule (100 mg total) by mouth 3 (three) times daily. 10/12/22   Love, Renay Carota, PA-C  Lactobacillus (ACIDOPHILUS) 100 MG CAPS Take 1 capsule (100 mg total) by mouth 2 (two) times daily. 10/12/22   Love, Renay Carota, PA-C  lamoTRIgine (LAMICTAL) 100 MG tablet Take 200 mg by mouth at bedtime. 05/17/23   [provider]  levothyroxine (SYNTHROID) 88 MCG tablet Take 1 tablet (88 mcg total) by mouth daily before breakfast. 10/13/22   Love, Renay Carota, PA-C  losartan (COZAAR) 25 MG tablet Take 0.5 tablets (12.5 mg total) by mouth daily. 09/06/23 12/05/23  Flo Hummingbird, PA-C  melatonin 5 MG TABS Take 1 tablet (5 mg total) by mouth at bedtime as  needed. 10/12/22   Love, Renay Carota, PA-C  metFORMIN (GLUCOPHAGE) 500 MG tablet Take 1 tablet (500 mg total) by mouth 2 (two) times daily with a meal. 10/12/22   Love, Renay Carota, PA-C  Multiple Vitamin (MULTIVITAMIN) tablet Take 1 tablet by mouth daily.      [provider]  Multiple Vitamins-Minerals (ZINC PO) Take 50 mg by mouth daily.    [provider]  nicotine (NICODERM CQ - DOSED IN MG/24 HR) 7 mg/24hr patch Place 1 patch (7 mg total) onto the skin daily. 10/20/23   Byrum, Robert S, MD  ondansetron (ZOFRAN) 4 MG tablet Take 1 tablet (4 mg total) by mouth every 8 (eight) hours as needed for refractory nausea / vomiting. 10/12/22   Love, Renay Carota, PA-C  potassium chloride (KLOR-CON) 10 MEQ tablet Take 30 mEq by mouth 2 (two) times daily. 04/28/23   [provider]  RESTASIS MULTIDOSE 0.05 % ophthalmic emulsion 1 drop 2 (two) times daily.    [provider]  rosuvastatin (CRESTOR) 20 MG tablet 1 tablet by mouth Once a day for 30 days    [provider]  sodium chloride 1 g tablet Take 1 tablet (1 g total) by mouth 2 (two) times daily with a meal. 10/12/22   Love, Renay Carota, PA-C  traZODone (DESYREL) 100 MG tablet Take 1 tablet (100 mg total) by mouth at bedtime. 11/09/22   Jodi Munroe, NP    Physical Exam    Vital Signs:  TONICA BRASINGTON does not have vital signs available for review today.138/83  Given telephonic nature of communication, physical exam is limited. AAOx3. NAD. Normal affect.  Speech and respirations are unlabored.  Accessory Clinical Findings    None  Assessment & Plan    1.  Preoperative Cardiovascular Risk Assessment: - Patient's RCRI score is 6.6%  The patient affirms she has been doing well without any new cardiac symptoms. They are able to achieve 4 METS without cardiac limitations. Therefore, based on ACC/AHA guidelines, the patient would be at acceptable risk for the planned procedure without further cardiovascular  testing. The patient was advised that if she develops new symptoms prior to surgery to contact our office to  arrange for a follow-up visit, and she verbalized understanding.   Patient can hold ASA 5 to 7 days and Jardiance 3 days prior to procedure  The patient was advised that if she develops new symptoms prior to surgery to contact our office to arrange for a follow-up visit, and she verbalized understanding.  A copy of this note will be routed to requesting surgeon.  Time:   Today, I have spent 7 minutes with the patient with telehealth technology discussing medical history, symptoms, and management plan.     Francene Ing, Retha Cast, NP  11/03/2023, 7:21 AM

## 2023-11-04 ENCOUNTER — Ambulatory Visit: Admitting: Cardiology

## 2023-11-08 NOTE — Progress Notes (Unsigned)
 Subjective:  Chief complaint: ongoing back pain   Patient ID: [name], female    DOB: 1956/02/26, 68 y.o.   MRN: [id number]  HPI   68 y.o. female with hx of scoliosis surgery with extensive lower thoracic and lumbar fusion roughly 21yrs ago, she suffered ground level fall in late November, where she fractures through all three columns of L3, thus went to OR on 12/9 for stabilization, new  posterolateral instrumentation fusion on June 26, 2022 complicated by Wound dehiscence with exposed hardware and unstable lumbar spine fracture status post revision of lumbar wound with removal of L1-3 4 and 5 lumbar screws and rod and removal of right L1 screw and revision of L1-L5 posterior instrumented fusion on August 09, 2022 and now concern for postoperative wound infection and ventriculitis. She had complicated protracted course in the icu, but eventually was discharged to CIR on vancomycin  and cefepime  through 3/14 for ventriculitis and hw complicating wound infection. Now changed to doxcycyline plus levofloxacin  as of 3/15 while she continued to need medihoney with moistened quaze with sliver hydrofiber to the caudal portion of wound.   Mri of brain on 09/29/22- resolution of ventriculitis.   She was seen by my partner Dr. Levern Reader while she was in the inpatient rehab unit.  Follow-up was rescheduled for May 2024.  Interim history:  "Back pain did not appear to be worse but wound did persist and apparently tunneled cranially.  Her white blood cell count with [facility] was in the 16,000 range an abrupt change from her last labs as an inpatient when it was in the 7000 range.  He did have a mechanical fall and fell on her left knee where she has a prosthetic joint.  When I last saw her we rechecked her inflammatory markers and sed rate had titrated down though CRP was going up to 15.6  WBC recheck with our lab was 12,000.  In the interim she has been seen by Dr. Dorothy Gates with  neurosurgery and CT of the thoracic and lumbar spine were performed.  These have shown: A new T10 compression fracture with 12% loss of height with unchanged change adjacent chronic T10-T11 T12-L1 posterior element ankylosis with chronic nonfusion at T11-T12, L3 superior endplate nonacute fracture and subtle horizontal fracture of the chronic posterior element of the bone, difficult exclude nondisplaced S3-4 sacral fracture with mild presacral stranding.   Checked labs last time we saw her white blood cell count remained elevated 12,000.  Sed rate and CRP [place] sed rate actually normalized and CRP had gone up.  Changed her evofloxacin and doxycycline  to cefdinir  and doxycycline .  She is continuing to take these.  She did develop a painful erythematous rash on her hands due to doxycycline  use in the context of sun exposure at the beach.  This is subsequently resolved she has a new somewhat purpuric rash on her right arm that came up in the last few days " She returns to clinic today for follow-up  Discussed the use of AI scribe software for clinical note transcription with the patient, who gave verbal consent to proceed.  At her last visit with me on July 14, 2023 the patient had developed worsening pain in the thoracic spine and hip.  She was also with reduced strength and mobility with having to to now use a walker compared to a cane.  The patient had an MRI of the thoracic and lumbar spine pending after an initial scan done with [facility] had  suggested potential infection in the spine.  She also has been found to have a 8 mm solid mass in the right lung on CT scan.  The MRI was ultimately read and showed  IMPRESSION: 1. Fluid signal within the L3-L4 and L4-L5 discs is suspicious for discitis-osteomyelitis. Progressed bone marrow edema within the L4 vertebral body and bilateral pedicles. There is also increased bone marrow edema at the superior endplate of L5 and  right L5 pedicle. 2. Possible subacute superior endplate fractures of the L4 and L5 vertebral bodies without associated height loss. 3. Minimal residual marrow edema associated with L3 superior endplate fracture. 4. 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  The case was discussed with Dr. Julane Ny and we both agreed that it would be prudent to have the patient's stop antibiotics and pursue IR guided biopsy for culture after she had been off antibiotics for several weeks.  I called the patient and her daughter on the 26th in the evening and asked that she stop antibiotics so that we could get a culture after she had been off antibiotics for several weeks.  She did not stop antibiotics that night.  In the interim she did have worsening of her back pain she underwent IR guided biopsy with culture yesterday with Gram stain having been negative no organisms isolated yet to date.  With regards to the her lung nodule we have done lab work here including a cryptococcal antigen in the serum which is negative urine histoplasma antigen and Blastomyces antigen.  She has been seen by Dr. Baldwin Levee who noted that there was a pneumonia in the area where the lung nodule was seen and he wondered if this finding read as a nodule that might instead represent scarring.  He wanted to pursue further imaging with a PET scan before considering bronchoscopic be with BAL and biopsies.  At her visit with me on December 26th, 2024 patient had worsening pain in an thoracic and lumbar spine after initial scan with [facility] had suggested potential infection in the spine.  She was then found to have an 8 mm solid mass in the right lung on CT scan MRI was ultimately read and showed fluid signal within the L3-L4 and L4-L5 disc suspicious for discitis osteomyelitis with progressed bone marrow edema in the L4 vertebral body and bilateral pedicles also with increased bone marrow edema in the superior  endplate of L5 and right L5 pedicle with possible versus subacute.  Endplate fractures in L4 and L5 with minimal residual bone marrow associated L3 superior endplate fracture with chronic findings of arachnoiditis in the lower lumbar spine.  I called the patient and her daughter on the 67 and had them stop antibiotics with plans for IR guided biopsy for culture.  This was accomplished after they had held antibiotics for several weeks.  Cultures were unrevealing.  We subsequently started her on yet another empiric regimen of oral doxycycline  and cefadroxil  until we could get a line placed at that point in time we placed her on IV daptomycin and cefepime .  In the interim she had complained of vaginal discharge though now I have found out that she has a rectovaginal fistula which could certainly explain that problem.  She then began to complain of malaise with labile blood pressures and lightheadedness nauseousness with tachycardia.    The patient thought there is due to side effects of IV antibiotics and we instructed  her hold IV cefepime  and continue myosin but she actually  did the reverse and stop the daptomycin and continued cefepime .  Recently yesterday when she was seen with cardiology she was hypotensive seated in the chair in the 60s and apparently they had recommended her being seen in the emergency room and potentially hospitalized.  She was given fluid bolus there with improvement in her blood pressure and her losartan  is on hold  Her back pain does not seem to be worse she has no respiratory complaints she is not complaining now of much in the way of nausea or abdominal pain.  She is does continue to have discharge from her rectovaginal fistula and also "gas" that will emanate from this.  I am quite worried that I have other intra-abdominal pathology such as intra-abdominal abscess or fistula to another organ that is causing all of her symptoms of malaise palpitations  nausea.  Review of her labs from home health showed slight leukocytosis of white count to 14,000 with slight eosinophilia though not terribly much above the normal range her inflammatory markers have come down in the last 2 weeks from 72-23 in the case of her sed rate and 30 116 and 51 in terms of her CRP.  She had been seen by urogynecology who had found frank stool in the vagina from the fistula.  They recommended her being seen by general surgery and wanted a CT scan of the abdomen pelvis performed I think getting a CT scan abdomen pelvis is critical and I would feel better if we could have her directly admitted to the hospital to speed up the process of getting a CT of the abdomen pelvis because I am worried that she may have more pathology than the rectovaginal fistula such as an intra-abdominal abscess driving symptoms.  However Ninette Basque is emphatic about going back into the hospital if at all possible."    She had her CT abdomen pelvis performed which showed the following:  IMPRESSION: Image degradation in lower pelvis due to bilateral hip prostheses and rectal tube.   Findings highly suspicious for gas-containing rectovaginal fistula,  With linear gas collection between the anterior wall of the sigmoid colon and bladder to the left of midline which descends to the region of the vaginal cuff (see image 83/12), highly suspicious for rectovaginal fistula.   Diffuse colonic diverticulosis, with muscular hypertrophy and sigmoid colon. No radiographic evidence of acute diverticulitis.   Multiple ill-defined ground-glass and subsolid nodular opacities in the visualized portions of the right middle and lower lobes. This is almost certainly inflammatory or infectious in etiology given that it is a new compared to recent PET-CT 1 month ago. Recommend clinical correlation, and continued follow-up by chest CT.    I saw the report of the CT scan with regards to the amatory groundglass  opacity had some anxiety about whether she could be having some daptomycin induced eosinophilic pneumonia.  The patient however has no respiratory symptoms other than some chronic dyspnea that preceded her being on antibiotic she has no cough.  Her CBC shows no peripheral eosinophilia and I do not think she has daptomycin induced developed pneumonia.  She still has GI upset but is been able to take her daptomycin and cefepime  with planned stop date of 18 March.  Her inflammatory markers have been a bit of a mixed picture with her sed rate having gone up to 59 from a value of 23 but that was down from 72 her CRP has come down consistently now at 14 from 51 and 116 prior to  that her back pain seems relatively stable though it is worse than it was when she first was dealing with her ventriculitis.  She does have a compression fracture as well.  Surgery is being contemplated by her urogynecologist potentially with general surgery involvement as well.  In talking the patient and her daughter it seems as if the surgeons want her back infection "declared cured prior to operating.  I do not think that she should be necessary because her spine if she still harbors infection is of much lower risk to her as far as uncontrolled infection than her rectovaginal fistula.   I have a much greater anxiety her nto have source control in her abdomen and pelvis.             Past Medical History:  Diagnosis Date  . Anemia   . Anxiety   . Arthritis   . Bipolar disorder (HCC)   . Cardiomyopathy (HCC)   . Depression   . Diskitis 10/04/2023  . GERD (gastroesophageal reflux disease)   . Heart failure (HCC)   . Heart murmur    "related to VSD"  . High cholesterol   . History of blood transfusion    "related to OR" (08/19/2016)  . History of hiatal hernia   . Hypertension   . Hyperthyroidism   . Hypotension 09/06/2023  . Mild cognitive impairment 09/06/2018  . Paroxysmal ventricular tachycardia (HCC)   .  Rectovaginal fistula 09/06/2023  . Tachycardia, unspecified   . Type II diabetes mellitus (HCC)   . UTI (urinary tract infection)    being treated with Keflex   . Ventricular septal defect   . Ventriculitis of brain due to bacteria 11/02/2022  . Vertebral fracture, osteoporotic (HCC) 01/06/2023    Past Surgical History:  Procedure Laterality Date  . ABDOMINAL HYSTERECTOMY    . BACK SURGERY    . CARDIAC CATHETERIZATION N/A 06/23/2015   Procedure: Left Heart Cath and Coronary Angiography;  Surgeon: Darlis Eisenmenger, MD;  Location: [facility] INVASIVE CV LAB;  Service: Cardiovascular;  Laterality: N/A;  . CARDIAC CATHETERIZATION  1960   "VSD was so small; didn't need repaired"  . EXAM UNDER ANESTHESIA WITH MANIPULATION OF HIP Right 06/02/2014   dr Ana Balling  . FRACTURE SURGERY    . HERNIA REPAIR    . HIP CLOSED REDUCTION Right 06/02/2014   Procedure: CLOSED MANIPULATION HIP;  Surgeon: Alphonzo Ask, MD;  Location: [facility] OR;  Service: Orthopedics;  Laterality: Right;  . IR FLUORO GUIDED NEEDLE PLC ASPIRATION/INJECTION LOC  08/09/2023  . JOINT REPLACEMENT    . JOINT REPLACEMENT    . POSTERIOR LUMBAR FUSION 4 LEVEL N/A 06/26/2022   Procedure: Lumbar One To Lumbar Five Posterior Instrumented Fusion;  Surgeon: Cannon Champion, MD;  Location: [facility] OR;  Service: Neurosurgery;  Laterality: N/A;  . REFRACTIVE SURGERY Bilateral   . SHOULDER ARTHROSCOPY Right   . SHOULDER OPEN ROTATOR CUFF REPAIR Right   . SPINAL FUSION  1996   "t10 down to my coccyx  . SPINE HARDWARE REMOVAL    . TOTAL ABDOMINAL HYSTERECTOMY    . TOTAL HIP ARTHROPLASTY Right 05/10/2014   [place]      by dr Mayer Speaker  . TOTAL KNEE ARTHROPLASTY Left   . TOTAL SHOULDER ARTHROPLASTY Left 08/19/2016   Procedure: TOTAL SHOULDER ARTHROPLASTY;  Surgeon: Sammye Cristal, MD;  Location: [facility] OR;  Service: Orthopedics;  Laterality: Left;  Left total shoulder replacement    Family History  Problem Relation Age of Onset  .  Other  Mother        alive  . Stroke Father 106       deceased  . Dementia Father   . Chorea Maternal Grandfather   . Dementia Maternal Aunt   . Dementia Maternal Aunt   . Heart attack Other        multiple uncles have died with myocardial infarction  . Bladder Cancer Neg Hx   . Uterine cancer Neg Hx       Social History   Socioeconomic History  . Marital status: Married    Spouse name: Not on file  . Number of children: Not on file  . Years of education: Not on file  . Highest education level: Master's degree (e.g., MA, MS, MEng, MEd, MSW, MBA)  Occupational History  . Occupation: Magazine features editor: SELF-EMPLOYED    Comment: former  Tobacco Use  . Smoking status: Every Day    Types: Cigarettes    Passive exposure: Never  . Smokeless tobacco: Never  . Tobacco comments:    03/30/21 smokes 2 cigs daily.      08/06/22 Camilo Cella stated that the patient is not smoking while at [facility] for rehab. [facility]  Vaping Use  . Vaping status: Never Used  Substance and Sexual Activity  . Alcohol use: No  . Drug use: No  . Sexual activity: Not Currently    Partners: Male    Birth control/protection: Surgical    Comment: Hysterectomy  Other Topics Concern  . Not on file  Social History Narrative   Right handed   Caffeine  2-3 cups daily    Lives at home with husband    Social Drivers of Health   Financial Resource Strain: Low Risk  (07/08/2023)   Received from [facility]   Overall Financial Resource Strain (CARDIA)   . Difficulty of Paying Living Expenses: Not hard at all  Food Insecurity: No Food Insecurity (07/08/2023)   Received from [facility]   Hunger Vital Sign   . Worried About Programme researcher, broadcasting/film/video in the Last Year: Never true   . Ran Out of Food in the Last Year: Never true  Transportation Needs: No Transportation Needs (07/08/2023)   Received from [facility]   [facility] - Transportation   . In the past 12 months, has  lack of transportation kept you from medical appointments or from getting medications?: No   . Lack of Transportation (Non-Medical): No  Physical Activity: Not on file  Stress: Not on file  Social Connections: Not on file    Allergies  Allergen Reactions  . Gallium (Radioactive Isotope) Swelling    Pt states she can not have radioactive injections.   . Paxlovid [Nirmatrelvir-Ritonavir]      Current Outpatient Medications:  .  acetaminophen  (TYLENOL ) 325 MG tablet, Take 1-2 tablets (325-650 mg total) by mouth every 6 (six) hours as needed for mild pain or headache., Disp: , Rfl:  .  amoxicillin -clavulanate (AUGMENTIN ) 875-125 MG tablet, Take 1 tablet by mouth 2 (two) times daily., Disp: 60 tablet, Rfl: 11 .  ascorbic acid (VITAMIN C) 500 MG tablet, Take 500 mg by mouth daily., Disp: , Rfl:  .  ASPIRIN  81 PO, Take 1 tablet by mouth daily., Disp: , Rfl:  .  buPROPion  (WELLBUTRIN  XL) 150 MG 24 hr tablet, Take 1 tablet (150 mg total) by mouth daily., Disp: 30 tablet, Rfl: 0 .  carvedilol  (COREG ) 25 MG tablet, Take  1 tablet (25 mg total) by mouth 2 (two) times daily with a meal., Disp: 60 tablet, Rfl: 0 .  Cholecalciferol (VITAMIN D3) 50 MCG (2000 UT) capsule, Take 2,000 Units by mouth daily., Disp: , Rfl:  .  cyclobenzaprine  (FLEXERIL ) 10 MG tablet, Take 10 mg by mouth 3 (three) times daily., Disp: , Rfl:  .  diphenoxylate -atropine  (LOMOTIL ) 2.5-0.025 MG tablet, Take 1 tablet by mouth 2 (two) times daily as needed for diarrhea or loose stools., Disp: 30 tablet, Rfl: 0 .  donepezil  (ARICEPT ) 10 MG tablet, Take 10 mg by mouth every morning., Disp: , Rfl:  .  doxycycline  (VIBRA -TABS) 100 MG tablet, Take 1 tablet (100 mg total) by mouth 2 (two) times daily., Disp: 60 tablet, Rfl: 11 .  empagliflozin  (JARDIANCE ) 10 MG TABS tablet, Take 1 tablet (10 mg total) by mouth daily before breakfast., Disp: 90 tablet, Rfl: 3 .  estradiol  (ESTRACE ) 0.1 MG/GM vaginal cream, Place 0.5g nightly for two weeks  then twice a week after, Disp: 30 g, Rfl: 3 .  famotidine  (PEPCID ) 20 MG tablet, Take 1 tablet (20 mg total) by mouth 2 (two) times daily., Disp: 60 tablet, Rfl: 0 .  fenoprofen (NALFON) 600 MG TABS tablet, Take 600 mg by mouth in the morning and at bedtime., Disp: , Rfl:  .  fluvoxaMINE  (LUVOX ) 100 MG tablet, Take 1 tablet (100 mg total) by mouth at bedtime., Disp: 30 tablet, Rfl: 0 .  gabapentin  (NEURONTIN ) 100 MG capsule, Take 1 capsule (100 mg total) by mouth 3 (three) times daily., Disp: 90 capsule, Rfl: 0 .  Lactobacillus (ACIDOPHILUS) 100 MG CAPS, Take 1 capsule (100 mg total) by mouth 2 (two) times daily., Disp: 60 capsule, Rfl: 0 .  lamoTRIgine  (LAMICTAL ) 100 MG tablet, Take 200 mg by mouth at bedtime., Disp: , Rfl:  .  levothyroxine  (SYNTHROID ) 88 MCG tablet, Take 1 tablet (88 mcg total) by mouth daily before breakfast., Disp: 30 tablet, Rfl: 0 .  losartan  (COZAAR ) 25 MG tablet, Take 0.5 tablets (12.5 mg total) by mouth daily., Disp: , Rfl:  .  melatonin 5 MG TABS, Take 1 tablet (5 mg total) by mouth at bedtime as needed., Disp: 30 tablet, Rfl: 0 .  metFORMIN  (GLUCOPHAGE ) 500 MG tablet, Take 1 tablet (500 mg total) by mouth 2 (two) times daily with a meal., Disp: 60 tablet, Rfl: 0 .  Multiple Vitamin (MULTIVITAMIN) tablet, Take 1 tablet by mouth daily.  , Disp: , Rfl:  .  Multiple Vitamins-Minerals (ZINC PO), Take 50 mg by mouth daily., Disp: , Rfl:  .  nicotine  (NICODERM CQ  - DOSED IN MG/24 HR) 7 mg/24hr patch, Place 1 patch (7 mg total) onto the skin daily., Disp: 28 patch, Rfl: 0 .  ondansetron  (ZOFRAN ) 4 MG tablet, Take 1 tablet (4 mg total) by mouth every 8 (eight) hours as needed for refractory nausea / vomiting., Disp: 20 tablet, Rfl: 0 .  potassium chloride  (KLOR-CON ) 10 MEQ tablet, Take 30 mEq by mouth 2 (two) times daily., Disp: , Rfl:  .  RESTASIS MULTIDOSE 0.05 % ophthalmic emulsion, 1 drop 2 (two) times daily., Disp: , Rfl:  .  rosuvastatin  (CRESTOR ) 20 MG tablet, 1 tablet by  mouth Once a day for 30 days, Disp: , Rfl:  .  sodium chloride  1 g tablet, Take 1 tablet (1 g total) by mouth 2 (two) times daily with a meal., Disp: 60 tablet, Rfl: 0 .  traZODone  (DESYREL ) 100 MG tablet, Take 1 tablet (100 mg total)  by mouth at bedtime., Disp: 30 tablet, Rfl: 0   Review of Systems      Objective:   Physical Exam        Assessment & Plan:   Assessment and Plan

## 2023-11-09 ENCOUNTER — Other Ambulatory Visit: Payer: Self-pay

## 2023-11-09 ENCOUNTER — Encounter: Payer: Self-pay | Admitting: Infectious Disease

## 2023-11-09 ENCOUNTER — Ambulatory Visit: Payer: Self-pay | Admitting: Infectious Disease

## 2023-11-09 VITALS — BP 146/83 | HR 66 | Temp 97.8°F

## 2023-11-09 DIAGNOSIS — T847XXD Infection and inflammatory reaction due to other internal orthopedic prosthetic devices, implants and grafts, subsequent encounter: Secondary | ICD-10-CM

## 2023-11-09 DIAGNOSIS — M4645 Discitis, unspecified, thoracolumbar region: Secondary | ICD-10-CM | POA: Diagnosis not present

## 2023-11-09 DIAGNOSIS — S32009K Unspecified fracture of unspecified lumbar vertebra, subsequent encounter for fracture with nonunion: Secondary | ICD-10-CM

## 2023-11-09 DIAGNOSIS — T847XXS Infection and inflammatory reaction due to other internal orthopedic prosthetic devices, implants and grafts, sequela: Secondary | ICD-10-CM

## 2023-11-09 DIAGNOSIS — M8000XK Age-related osteoporosis with current pathological fracture, unspecified site, subsequent encounter for fracture with nonunion: Secondary | ICD-10-CM

## 2023-11-09 DIAGNOSIS — N823 Fistula of vagina to large intestine: Secondary | ICD-10-CM

## 2023-11-09 DIAGNOSIS — G8929 Other chronic pain: Secondary | ICD-10-CM

## 2023-11-10 ENCOUNTER — Ambulatory Visit: Payer: Medicare Other | Admitting: Cardiology

## 2023-11-10 ENCOUNTER — Telehealth: Payer: Self-pay

## 2023-11-10 LAB — CBC WITH DIFFERENTIAL/PLATELET
Absolute Lymphocytes: 2421 {cells}/uL (ref 850–3900)
Absolute Monocytes: 490 {cells}/uL (ref 200–950)
Basophils Absolute: 71 {cells}/uL (ref 0–200)
Basophils Relative: 0.8 %
Eosinophils Absolute: 223 {cells}/uL (ref 15–500)
Eosinophils Relative: 2.5 %
HCT: 36.3 % (ref 35.0–45.0)
Hemoglobin: 11.3 g/dL — ABNORMAL LOW (ref 11.7–15.5)
MCH: 25.1 pg — ABNORMAL LOW (ref 27.0–33.0)
MCHC: 31.1 g/dL — ABNORMAL LOW (ref 32.0–36.0)
MCV: 80.7 fL (ref 80.0–100.0)
MPV: 8.7 fL (ref 7.5–12.5)
Monocytes Relative: 5.5 %
Neutro Abs: 5696 {cells}/uL (ref 1500–7800)
Neutrophils Relative %: 64 %
Platelets: 429 10*3/uL — ABNORMAL HIGH (ref 140–400)
RBC: 4.5 10*6/uL (ref 3.80–5.10)
RDW: 16.4 % — ABNORMAL HIGH (ref 11.0–15.0)
Total Lymphocyte: 27.2 %
WBC: 8.9 10*3/uL (ref 3.8–10.8)

## 2023-11-10 LAB — BASIC METABOLIC PANEL WITHOUT GFR
BUN/Creatinine Ratio: 24 (calc) — ABNORMAL HIGH (ref 6–22)
BUN: 11 mg/dL (ref 7–25)
CO2: 27 mmol/L (ref 20–32)
Calcium: 9.3 mg/dL (ref 8.6–10.4)
Chloride: 98 mmol/L (ref 98–110)
Creat: 0.46 mg/dL — ABNORMAL LOW (ref 0.50–1.05)
Glucose, Bld: 90 mg/dL (ref 65–99)
Potassium: 4.9 mmol/L (ref 3.5–5.3)
Sodium: 133 mmol/L — ABNORMAL LOW (ref 135–146)

## 2023-11-10 LAB — SEDIMENTATION RATE: Sed Rate: 34 mm/h — ABNORMAL HIGH (ref 0–30)

## 2023-11-10 LAB — C-REACTIVE PROTEIN: CRP: 36.9 mg/L — ABNORMAL HIGH (ref <8.0)

## 2023-11-10 NOTE — Telephone Encounter (Signed)
-----   Message from Kane sent at 11/10/2023 12:03 PM EDT ----- Regarding: FW: Esr and crp not too terribly elevated ----- Message ----- From: Addison Holster Lab Results In Sent: 11/09/2023  11:03 PM EDT To: Charolette Copier, MD

## 2023-11-11 ENCOUNTER — Other Ambulatory Visit: Payer: Self-pay | Admitting: Urology

## 2023-11-21 ENCOUNTER — Ambulatory Visit: Attending: Cardiology | Admitting: Cardiology

## 2023-11-21 VITALS — BP 134/72 | HR 71 | Ht 60.0 in | Wt 134.0 lb

## 2023-11-21 DIAGNOSIS — Q210 Ventricular septal defect: Secondary | ICD-10-CM

## 2023-11-21 DIAGNOSIS — I428 Other cardiomyopathies: Secondary | ICD-10-CM | POA: Diagnosis not present

## 2023-11-21 DIAGNOSIS — I10 Essential (primary) hypertension: Secondary | ICD-10-CM

## 2023-11-21 DIAGNOSIS — Z01810 Encounter for preprocedural cardiovascular examination: Secondary | ICD-10-CM

## 2023-11-21 NOTE — Patient Instructions (Signed)

## 2023-11-21 NOTE — Progress Notes (Signed)
 Cardiology Office Note:  .   Date:  11/21/2023  ID:  [name], DOB Aug 23, 1955, MRN [id number] PCP: Dorena Gander, MD  [facility] Providers Cardiologist:  Dorothye Gathers, MD    History of Present Illness: .   [name] is a 68 y.o. female Discussed the use of AI scribe software for clinical note transcription with the patient, who gave verbal consent to proceed.  History of Present Illness [name] "Ninette Basque" is a 68 year old female with ventricular septal defect and nonobstructive coronary artery disease who presents for follow-up.  She has a history of ventricular septal defect (VSD) and nonobstructive coronary artery disease (CAD) diagnosed on catheterization in 2016. Her most recent echocardiogram in 2024 showed a small paramembranous VSD and an ejection fraction (EF) of 45-50%, with mild to moderate mitral valve regurgitation.  At her last clinic visit on September 06, 2023, she was hypotensive with a blood pressure of 69/49 mmHg and experienced significant dizziness. She is currently on carvedilol  25 mg twice daily and losartan  12.5 mg daily. Her blood pressure today is 146/83 mmHg.  She has a complex medical history including a ventricular tachycardia ablation and a recent hospitalization for a fall resulting in a fractured L3 vertebra. She underwent spinal fusion surgery, during which a screw punctured her spinal fluid, leading to meningitis and subsequent complications including a tracheostomy and prolonged ICU stay. She was treated with IV antibiotics at home for six weeks for an infection in her back, which has reduced but not resolved.  She is scheduled for a bowel resection surgery due to a fistula. She is concerned about the upcoming surgery, referencing her previous prolonged recovery after back surgery.  She is also on Crestor  20 mg daily for hyperlipidemia. She sees multiple specialists regularly, including a urogynecologist, and manages her  health with the help of her family.    Studies Reviewed: Aaron Aas   EKG Interpretation Date/Time:  Monday Nov 21 2023 14:53:37 EDT Ventricular Rate:  79 PR Interval:  150 QRS Duration:  114 QT Interval:  436 QTC Calculation: 499 R Axis:   -36  Text Interpretation: Normal sinus rhythm Left axis deviation Minimal voltage criteria for LVH, may be normal variant ( Cornell product ) Nonspecific ST and T wave abnormality Prolonged QT When compared with ECG of 10-Feb-2023 10:28, Nonspecific T wave abnormality, improved in Lateral leads Confirmed by Dorothye Gathers ([id number]) on 11/21/2023 3:02:36 PM    Results DIAGNOSTIC Echocardiogram: Small paramembranous VSD, EF 45-50%, mild to moderate mitral valve regurgitation (2024) EKG: Sinus rhythm, unremarkable (11/21/2023) Risk Assessment/Calculations:           Physical Exam:   VS:  There were no vitals taken for this visit.   Wt Readings from Last 3 Encounters:  10/20/23 136 lb (61.7 kg)  10/19/23 135 lb (61.2 kg)  09/07/23 133 lb 4.8 oz (60.5 kg)    GEN: Well nourished, well developed in no acute distress NECK: No JVD; No carotid bruits CARDIAC: RRR, no murmurs, no rubs, no gallops RESPIRATORY:  Clear to auscultation without rales, wheezing or rhonchi  ABDOMEN: Soft, non-tender, non-distended EXTREMITIES:  No edema; No deformity   ASSESSMENT AND PLAN: .    Assessment and Plan Assessment & Plan Nonobstructive coronary artery disease/nonischemic cardiomyopathy-EF 45 to 50% minimally reduced. Nonobstructive coronary artery disease identified on catheterization in 2016. No acute issues during this visit.  Mitral valve regurgitation Mild to moderate mitral valve regurgitation identified on echocardiogram in 2024. No acute  issues during this visit.  Ventricular septal defect Small paramembranous ventricular septal defect identified on echocardiogram in 2024. No acute issues during this visit.  Primary hypertension Hypertension well-controlled  with carvedilol  and losartan . Previous hypotensive episode with blood pressure of 69/49 mmHg causing dizziness. Current blood pressure is 146/83 mmHg, indicating improvement. - Continue carvedilol  25 mg twice a day - Continue losartan  12.5 mg daily - Monitor blood pressure regularly, no changes made  Meningitis Meningitis following spinal surgery due to puncture of spinal fluid, managed with infectious disease consultation and treatment. No acute issues during this visit.  L3 fracture and spinal surgery L3 fracture and spinal surgery with complications including meningitis. No acute issues during this visit.  Tracheostomy Tracheostomy following complications from spinal surgery. No acute issues during this visit.  Infection in back Infection in back following spinal surgery, managed with IV antibiotics. Infection has dissipated somewhat but still present. No acute issues during this visit.  Goals of Care Advanced directives discussed during a critical illness in the ICU on a ventilator. Family was consulted about DNR status. She has expressed concerns about upcoming surgery due to previous complications.  Bowel fistula Okay to proceed with abdominal surgery from a cardiac perspective.  She should be from low to moderate risk at the highest.  Dr. Hershell Lose will be performing the surgery.            Signed, Dorothye Gathers, MD

## 2023-11-22 ENCOUNTER — Encounter (HOSPITAL_COMMUNITY): Payer: Self-pay

## 2023-11-22 NOTE — Patient Instructions (Addendum)
 SURGICAL WAITING ROOM VISITATION  Patients having surgery or a procedure may have no more than 2 support people in the waiting area - these visitors may rotate.    Children under the age of 24 must have an adult with them who is not the patient.  Due to an increase in RSV and influenza rates and associated hospitalizations, children ages 77 and under may not visit patients in [facility] hospitals.  Visitors with respiratory illnesses are discouraged from visiting and should remain at home.  If the patient needs to stay at the hospital during part of their recovery, the visitor guidelines for inpatient rooms apply. Pre-op nurse will coordinate an appropriate time for 1 support person to accompany patient in pre-op.  This support person may not rotate.    Please refer to the [facility] website for the visitor guidelines for Inpatients (after your surgery is over and you are in a regular room).       Your procedure is scheduled on: 11-30-23    Report to [facility] Main Entrance    Report to admitting at      0945   AM   Call this number if you have problems the morning of surgery [phone]   Follow a clear liquid diet day of  bowel prep to prevent dehydration              FOLLOW BOWEL PREP PER SURGEONS OFFICE   After Midnight you may have the following liquids until _0900_____ AM/  DAY OF SURGERY  THEN NOTHING BY MOUTH  Water Non-Citrus Juices (without pulp, NO RED-Apple, White grape, White cranberry) Black Coffee (NO MILK/CREAM OR CREAMERS, sugar ok)  Clear Tea (NO MILK/CREAM OR CREAMERS, sugar ok) regular and decaf                             Plain Jell-O (NO RED)                                           Fruit ices (not with fruit pulp, NO RED)                                     Popsicles (NO RED)                                                               Sports drinks like Gatorade (NO RED)              Drink 2 Ensure/G2 drinks AT 10:00 PM the night before  surgery.        The day of surgery:  Drink ONE (1) Pre-Surgery  G2 BY 0900 AM the morning of surgery. Drink in one sitting. Do not sip.  This drink was given to you during your hospital  pre-op appointment visit. Nothing else to drink after completing the  Pre-Surgery  G2.          If you have questions, please contact your surgeon's office.   FOLLOW BOWEL PREP AND ANY ADDITIONAL PRE OP INSTRUCTIONS  YOU RECEIVED FROM YOUR SURGEON'S OFFICE!!!     Oral Hygiene is also important to reduce your risk of infection.                                    Remember - BRUSH YOUR TEETH THE MORNING OF SURGERY WITH YOUR REGULAR TOOTHPASTE  DENTURES WILL BE REMOVED PRIOR TO SURGERY PLEASE DO NOT APPLY "Poly grip" OR ADHESIVES!!!   Do NOT smoke after Midnight   Stop all vitamins and herbal supplements 7 days before surgery.   Take these medicines the morning of surgery with A SIP OF WATER: CARVEDILOL , BUSPAR, WELLBUTRIN , TYLENOL  IF NEEDED, ROSUVASTATIN , PANTOPRAZOLE , LEVOTHYROXINE , GABAPENTIN , DOXYCYCLINE , DONEPEZIL , FLEXERIL , AUGMENTIN   DO NOT TAKE ANY ORAL DIABETIC MEDICATIONS DAY OF YOUR SURGERY  Hold Jardiance  72 hours prior to surgery last dose-  11-26-23   How to Manage Your Diabetes Before and After Surgery  Why is it important to control my blood sugar before and after surgery? Improving blood sugar levels before and after surgery helps healing and can limit problems. A way of improving blood sugar control is eating a healthy diet by:  Eating less sugar and carbohydrates  Increasing activity/exercise  Talking with your doctor about reaching your blood sugar goals High blood sugars (greater than 180 mg/dL) can raise your risk of infections and slow your recovery, so you will need to focus on controlling your diabetes during the weeks before surgery. Make sure that the doctor who takes care of your diabetes knows about your planned surgery including the date and location.  How do I  manage my blood sugar before surgery? Check your blood sugar at least 4 times a day, starting 2 days before surgery, to make sure that the level is not too high or low. Check your blood sugar the morning of your surgery when you wake up and every 2 hours until you get to the Short Stay unit. If your blood sugar is less than 70 mg/dL, you will need to treat for low blood sugar: Do not take insulin . Treat a low blood sugar (less than 70 mg/dL) with  cup of clear juice (cranberry or apple), 4 glucose tablets, OR glucose gel. Recheck blood sugar in 15 minutes after treatment (to make sure it is greater than 70 mg/dL). If your blood sugar is not greater than 70 mg/dL on recheck, call [phone] for further instructions. Report your blood sugar to the short stay nurse when you get to Short Stay.  If you are admitted to the hospital after surgery: Your blood sugar will be checked by the staff and you will probably be given insulin  after surgery (instead of oral diabetes medicines) to make sure you have good blood sugar levels. The goal for blood sugar control after surgery is 80-180 mg/dL.   WHAT DO I DO ABOUT MY DIABETES MEDICATION?  Do not take oral diabetes medicines (pills) the morning of surgery.  HOLD JARDIANCE  3 DAYS PRIOR TO SURGERY LAST DOSE 11-26-23   Bring CPAP mask and tubing day of surgery.                              You may not have any metal on your body including hair pins, jewelry, and body piercing             Do not wear make-up, lotions, powders, perfumes/cologne, or  deodorant  Do not wear nail polish including gel and S&S, artificial/acrylic nails, or any other type of covering on natural nails including finger and toenails. If you have artificial nails, gel coating, etc. that needs to be removed by a nail salon please have this removed prior to surgery or surgery may need to be canceled/ delayed if the surgeon/ anesthesia feels like they are unable to be safely  monitored.   Do not shave  48 hours prior to surgery.            Do not bring valuables to the hospital. [facility] IS NOT             RESPONSIBLE   FOR VALUABLES.   Contacts, glasses, dentures or bridgework may not be worn into surgery.   Bring small overnight bag day of surgery.   DO NOT BRING YOUR HOME MEDICATIONS TO THE HOSPITAL. PHARMACY WILL DISPENSE MEDICATIONS LISTED ON YOUR MEDICATION LIST TO YOU DURING YOUR ADMISSION IN THE HOSPITAL!    Patients discharged on the day of surgery will not be allowed to drive home.  Someone NEEDS to stay with you for the first 24 hours after anesthesia.   Special Instructions: Bring a copy of your healthcare power of attorney and living will documents the day of surgery if you haven't scanned them before.              Please read over the following fact sheets you were given: IF YOU HAVE QUESTIONS ABOUT YOUR PRE-OP INSTRUCTIONS PLEASE CALL [phone]   . If you test positive for Covid or have been in contact with anyone that has tested positive in the last 10 days please notify you surgeon.    [facility] - Preparing for Surgery Before surgery, you can play an important role.  Because skin is not sterile, your skin needs to be as free of germs as possible.  You can reduce the number of germs on your skin by washing with CHG (chlorahexidine gluconate) soap before surgery.  CHG is an antiseptic cleaner which kills germs and bonds with the skin to continue killing germs even after washing. Please DO NOT use if you have an allergy to CHG or antibacterial soaps.  If your skin becomes reddened/irritated stop using the CHG and inform your nurse when you arrive at Short Stay. Do not shave (including legs and underarms) for at least 48 hours prior to the first CHG shower.  You may shave your face/neck. Please follow these instructions carefully:  1.  Shower with CHG Soap the night before surgery and the  morning of Surgery.  2.  If you choose to wash  your hair, wash your hair first as usual with your  normal  shampoo.  3.  After you shampoo, rinse your hair and body thoroughly to remove the  shampoo.                           4.  Use CHG as you would any other liquid soap.  You can apply chg directly  to the skin and wash                       Gently with a scrungie or clean washcloth.  5.  Apply the CHG Soap to your body ONLY FROM THE NECK DOWN.   Do not use on face/ open  Wound or open sores. Avoid contact with eyes, ears mouth and genitals (private parts).                       Wash face,  Genitals (private parts) with your normal soap.             6.  Wash thoroughly, paying special attention to the area where your surgery  will be performed.  7.  Thoroughly rinse your body with warm water from the neck down.  8.  DO NOT shower/wash with your normal soap after using and rinsing off  the CHG Soap.                9.  Pat yourself dry with a clean towel.            10.  Wear clean pajamas.            11.  Place clean sheets on your bed the night of your first shower and do not  sleep with pets. Day of Surgery : Do not apply any lotions/deodorants the morning of surgery.  Please wear clean clothes to the hospital/surgery center.  FAILURE TO FOLLOW THESE INSTRUCTIONS MAY RESULT IN THE CANCELLATION OF YOUR SURGERY PATIENT SIGNATURE_________________________________  NURSE SIGNATURE__________________________________  ________________________________________________________________________

## 2023-11-22 NOTE — Progress Notes (Signed)
 PCP - Dorena Gander , MD  Cardiologist - Dorothye Gathers, MD LOV 11-21-23 Clearance   ID- LOV 11-09-23 epic  Ernie Heal, Blain Bulls, MD  PPM/ICD -  Device Orders -  Rep Notified -   Chest x-ray - CT chest 05-21-23 EKG - 11-21-23 epic Stress Test -  ECHO - 03-08-23 epic Cardiac Cath -  NM PET scan- 08-18-23 epic  Sleep Study -  CPAP -   Fasting Blood Sugar -  Checks Blood Sugar _____ times a day  Blood Thinner Instructions: Aspirin  Instructions:  ERAS Protcol - PRE-SURGERY Ensure or G2-    COVID vaccine -  Activity-- Anesthesia review: Ventral septal defect , non obsructive CAD,mild to mod mitral reguirg,V Tach Ablation, HTN, infection in back, meningitis  Patient denies shortness of breath, fever, cough and chest pain at PAT appointment   All instructions explained to the patient, with a verbal understanding of the material. Patient agrees to go over the instructions while at home for a better understanding. Patient also instructed to self quarantine after being tested for COVID-19. The opportunity to ask questions was provided.

## 2023-11-23 ENCOUNTER — Other Ambulatory Visit: Payer: Self-pay

## 2023-11-23 ENCOUNTER — Encounter (HOSPITAL_COMMUNITY)
Admission: RE | Admit: 2023-11-23 | Discharge: 2023-11-23 | Disposition: A | Discharge status: Home / Self Care | Source: Ambulatory Visit | Attending: Surgery | Admitting: Surgery

## 2023-11-23 ENCOUNTER — Encounter (HOSPITAL_COMMUNITY): Payer: Self-pay

## 2023-11-23 VITALS — BP 128/81 | HR 71 | Temp 98.2°F | Resp 16 | Ht 60.0 in | Wt 133.0 lb

## 2023-11-23 DIAGNOSIS — R739 Hyperglycemia, unspecified: Secondary | ICD-10-CM

## 2023-11-23 DIAGNOSIS — Z87891 Personal history of nicotine dependence: Secondary | ICD-10-CM | POA: Diagnosis not present

## 2023-11-23 DIAGNOSIS — Q210 Ventricular septal defect: Secondary | ICD-10-CM | POA: Insufficient documentation

## 2023-11-23 DIAGNOSIS — E119 Type 2 diabetes mellitus without complications: Secondary | ICD-10-CM | POA: Insufficient documentation

## 2023-11-23 DIAGNOSIS — I110 Hypertensive heart disease with heart failure: Secondary | ICD-10-CM | POA: Insufficient documentation

## 2023-11-23 DIAGNOSIS — Z01812 Encounter for preprocedural laboratory examination: Secondary | ICD-10-CM | POA: Diagnosis present

## 2023-11-23 DIAGNOSIS — Z01818 Encounter for other preprocedural examination: Secondary | ICD-10-CM

## 2023-11-23 DIAGNOSIS — I509 Heart failure, unspecified: Secondary | ICD-10-CM | POA: Insufficient documentation

## 2023-11-23 DIAGNOSIS — I2510 Atherosclerotic heart disease of native coronary artery without angina pectoris: Secondary | ICD-10-CM | POA: Diagnosis not present

## 2023-11-23 DIAGNOSIS — I428 Other cardiomyopathies: Secondary | ICD-10-CM | POA: Diagnosis not present

## 2023-11-23 DIAGNOSIS — N823 Fistula of vagina to large intestine: Secondary | ICD-10-CM | POA: Insufficient documentation

## 2023-11-23 DIAGNOSIS — E0590 Thyrotoxicosis, unspecified without thyrotoxic crisis or storm: Secondary | ICD-10-CM | POA: Diagnosis not present

## 2023-11-23 DIAGNOSIS — I340 Nonrheumatic mitral (valve) insufficiency: Secondary | ICD-10-CM | POA: Insufficient documentation

## 2023-11-23 HISTORY — DX: Pneumonia, unspecified organism: J18.9

## 2023-11-23 HISTORY — DX: Other complications of anesthesia, initial encounter: T88.59XA

## 2023-11-23 LAB — GLUCOSE, CAPILLARY: Glucose-Capillary: 118 mg/dL — ABNORMAL HIGH (ref 70–99)

## 2023-11-23 LAB — HEMOGLOBIN A1C
Hgb A1c MFr Bld: 5.7 % — ABNORMAL HIGH (ref 4.8–5.6)
Mean Plasma Glucose: 116.89 mg/dL

## 2023-11-23 NOTE — Consult Note (Addendum)
 WOC Nurse requested for preoperative stoma site marking  Discussed surgical procedure and possible stoma creation with patient and family member.  She became very upset and tearful and stated, "they better not need to give me one."  Explained that this is "just in case it needs to be performed" and the role of the [facility] nurse team. Provided the patient with educational booklet and provided samples of pouching options. Answered patient's questions.   Examined patient sitting in a wheel chair and leaning forward in the chair, in order to place the marking in the patient's visual field, away from any creases or abdominal contour issues and within the rectus muscle. Attempted to mark below the patient's belt line, but this was not possible, since she wears her pants above her umbilicus and a crease occurs both above and below the site markings when she leans forward which should be avoided if possible. This was a difficult marking situation, since any mark high enough to completely avoid the creases would be at the bottom of her rib cage.  Marked for colostomy in the LLQ  __5__ cm to the left of the umbilicus and __3__cm below the umbilicus.  Marked for ileostomy in the RLQ  __5__cm to the right of the umbilicus and  __3__ cm below the umbilicus.  Patient's abdomen cleansed with CHG wipes at site markings, allowed to air dry prior to marking. Covered mark with thin film transparent dressing to preserve mark until date of surgery. Provided with a marking pen and instructed to re-color in the marks if they begin to fade prior to surgery.   WOC Nurse team will follow up with patient after surgery for continued ostomy care and teaching if she receives an ostomy.  Thank-you,  Wiliam Harder MSN, RN, CWOCN, [place], CNS [phone]

## 2023-11-24 ENCOUNTER — Encounter: Attending: Physical Medicine & Rehabilitation | Admitting: Physical Medicine & Rehabilitation

## 2023-11-24 DIAGNOSIS — M546 Pain in thoracic spine: Secondary | ICD-10-CM | POA: Insufficient documentation

## 2023-11-24 DIAGNOSIS — M545 Low back pain, unspecified: Secondary | ICD-10-CM | POA: Insufficient documentation

## 2023-11-24 DIAGNOSIS — M4626 Osteomyelitis of vertebra, lumbar region: Secondary | ICD-10-CM | POA: Insufficient documentation

## 2023-11-24 DIAGNOSIS — G8929 Other chronic pain: Secondary | ICD-10-CM | POA: Insufficient documentation

## 2023-11-24 NOTE — Progress Notes (Signed)
 Anesthesia Chart Review   Case: [id number] Date/Time: 11/30/23 1145   Procedures:      COLECTOMY, SIGMOID, ROBOT-ASSISTED - POSSIBLE VAGINAL REPAIR AND POSSIBLE OSTOMY     SIGMOIDOSCOPY, FLEXIBLE     CYSTOSCOPY WITH INDOCYANINE GREEN IMAGING (ICG)   Anesthesia type: General   Diagnosis:      Colovaginal fistula [N82.4]     Diverticulosis [K57.90]   Pre-op diagnosis: COLOVAGINAL FISTULA   Location: [facility] ROOM 02 / [facility]   Surgeons: Candyce Champagne, MD; Samson Croak, MD       DISCUSSION:68 y.o. former smoker with h/o HTN, hyperthyroidism, DM II, VSD, nonobstructive CAD on cath 2016, nonischemic cardiomyopathy Ef 45-50%, mild to moderate mitral valve regurgitation, colovaginal fistula scheduled for above procedure 11/30/2023 with Dr. Candyce Champagne and Dr. Leila Punt.   H/o extensive lumbar thoracic fusion for scoliosis about 25 years ago.   Pt underwent spinal fusion surgery after a fractured L3, during which a screw punctured her spinal fluid, leading to meningitis and subsequent complications including a tracheostomy and prolonged ICU stay. She was treated with IV antibiotics at home for six weeks for an infection in her back, which has reduced but not resolved.   She was last seen by ID 11/09/2023. Remains on doxycycline  and augmentin . Per Dr. Ernie Heal pt can proceed with colonoscopy and subsequent surgery.   Pt last seen by cardiology 11/21/2023. Per OV note, "Okay to proceed with abdominal surgery from a cardiac perspective.  She should be from low to moderate risk at the highest.  Dr. Hershell Lose will be performing the surgery."  VS: BP 128/81   Pulse 71   Temp 36.8 C (Oral)   Resp 16   Ht 5' (1.524 m)   Wt 60.3 kg   SpO2 100%   BMI 25.97 kg/m   PROVIDERS: Marissa Gander, MD is PCP   Cardiologist - Dorothye Gathers, MD   LABS: Labs reviewed: Acceptable for surgery. (all labs ordered are listed, but only abnormal results are displayed)  Labs Reviewed  HEMOGLOBIN A1C - Abnormal;  Notable for the following components:      Result Value   Hgb A1c MFr Bld 5.7 (*)    All other components within normal limits  GLUCOSE, CAPILLARY - Abnormal; Notable for the following components:   Glucose-Capillary 118 (*)    All other components within normal limits  TYPE AND SCREEN     IMAGES:   EKG:   CV: Echo 03/08/2023  1. Small perimembranous VSD with noted shunt on color flow doppler. Left  ventricular ejection fraction, by estimation, is 45 to 50%. The left  ventricle has mildly decreased function. The left ventricle has no  regional wall motion abnormalities. Left  ventricular diastolic parameters are indeterminate.   2. Right ventricular systolic function is normal. The right ventricular  size is normal.   3. Left atrial size was moderately dilated.   4. The mitral valve is normal in structure. Mild to moderate mitral valve  regurgitation. No evidence of mitral stenosis.   5. Tricuspid valve regurgitation is mild to moderate.   6. The aortic valve is tricuspid. Aortic valve regurgitation is moderate.  Aortic valve sclerosis/calcification is present, without any evidence of  aortic stenosis. Aortic regurgitation PHT measures 353 msec.   7. There is mild dilatation of the ascending aorta, measuring 38 mm.  There is mild dilatation of the aortic root, measuring 38 mm.   8. The inferior vena cava is normal in size with  greater than 50%  respiratory variability, suggesting right atrial pressure of 3 mmHg.  Past Medical History:  Diagnosis Date   Anemia    Anxiety    Arthritis    Bipolar disorder (HCC)    Cardiomyopathy (HCC)    Complication of anesthesia    Awake but could not move   Depression    Diskitis 10/04/2023   GERD (gastroesophageal reflux disease)    Heart failure (HCC)    Acute   Heart murmur    "related to VSD"   High cholesterol    History of blood transfusion    "related to OR" (08/19/2016)   History of hiatal hernia    Hypertension     Hyperthyroidism    Hypotension 09/06/2023   Mild cognitive impairment 09/06/2018   Paroxysmal ventricular tachycardia (HCC)    Pneumonia    Rectovaginal fistula 09/06/2023   Tachycardia, unspecified    Type II diabetes mellitus (HCC)    UTI (urinary tract infection)    being treated with Keflex    Ventricular septal defect    Ventriculitis of brain due to bacteria 11/02/2022   Vertebral fracture, osteoporotic (HCC) 01/06/2023    Past Surgical History:  Procedure Laterality Date   ABDOMINAL HYSTERECTOMY     BACK SURGERY     CARDIAC CATHETERIZATION N/A 06/23/2015   Procedure: Left Heart Cath and Coronary Angiography;  Surgeon: Darlis Eisenmenger, MD;  Location: [facility];  Service: Cardiovascular;  Laterality: N/A;   CARDIAC CATHETERIZATION  1960   "VSD was so small; didn't need repaired"   EXAM UNDER ANESTHESIA WITH MANIPULATION OF HIP Right 06/02/2014   dr Ana Balling   FRACTURE SURGERY     HERNIA REPAIR     HIP CLOSED REDUCTION Right 06/02/2014   Procedure: CLOSED MANIPULATION HIP;  Surgeon: Alphonzo Ask, MD;  Location: [facility];  Service: Orthopedics;  Laterality: Right;   IR FLUORO GUIDED NEEDLE PLC ASPIRATION/INJECTION LOC  08/09/2023   JOINT REPLACEMENT     JOINT REPLACEMENT     POSTERIOR LUMBAR FUSION 4 LEVEL N/A 06/26/2022   Procedure: Lumbar One To Lumbar Five Posterior Instrumented Fusion;  Surgeon: Cannon Champion, MD;  Location: [facility];  Service: Neurosurgery;  Laterality: N/A;   REFRACTIVE SURGERY Bilateral    SHOULDER ARTHROSCOPY Right    SHOULDER OPEN ROTATOR CUFF REPAIR Right    SPINAL FUSION  1996   "t10 down to my coccyx   SPINE HARDWARE REMOVAL     TOTAL ABDOMINAL HYSTERECTOMY     TOTAL HIP ARTHROPLASTY Right 05/10/2014   [place]      by dr Veryl Gottron olcott   TOTAL KNEE ARTHROPLASTY Left    TOTAL SHOULDER ARTHROPLASTY Left 08/19/2016   Procedure: TOTAL SHOULDER ARTHROPLASTY;  Surgeon: Sammye Cristal, MD;  Location: [facility];  Service:  Orthopedics;  Laterality: Left;  Left total shoulder replacement    MEDICATIONS:  acetaminophen  (TYLENOL ) 325 MG tablet   amoxicillin -clavulanate (AUGMENTIN ) 875-125 MG tablet   ascorbic acid (VITAMIN C) 500 MG tablet   ASPIRIN  81 PO   buPROPion  (WELLBUTRIN  XL) 150 MG 24 hr tablet   busPIRone (BUSPAR) 7.5 MG tablet   Calcium  Carb-Cholecalciferol (CALCIUM  600+D3) 600-20 MG-MCG TABS   carvedilol  (COREG ) 25 MG tablet   Cholecalciferol (VITAMIN D3) 50 MCG (2000 UT) capsule   cyclobenzaprine  (FLEXERIL ) 10 MG tablet   denosumab (PROLIA) 60 MG/ML SOSY injection   diphenoxylate -atropine  (LOMOTIL ) 2.5-0.025 MG tablet   donepezil  (ARICEPT ) 10 MG tablet   doxycycline  (VIBRA -TABS) 100 MG  tablet   empagliflozin  (JARDIANCE ) 10 MG TABS tablet   estradiol  (ESTRACE ) 0.1 MG/GM vaginal cream   famotidine  (PEPCID ) 20 MG tablet   fenoprofen (NALFON) 600 MG TABS tablet   fluvoxaMINE  (LUVOX ) 100 MG tablet   gabapentin  (NEURONTIN ) 100 MG capsule   Lactobacillus (ACIDOPHILUS) 100 MG CAPS   lamoTRIgine  (LAMICTAL ) 100 MG tablet   levothyroxine  (SYNTHROID ) 88 MCG tablet   losartan  (COZAAR ) 25 MG tablet   Melatonin 10 MG TABS   melatonin 5 MG TABS   metFORMIN  (GLUCOPHAGE ) 500 MG tablet   Multiple Vitamin (MULTIVITAMIN) tablet   Multiple Vitamins-Minerals (ZINC PO)   nicotine  (NICODERM CQ  - DOSED IN MG/24 HOURS) 21 mg/24hr patch   nicotine  (NICODERM CQ  - DOSED IN MG/24 HR) 7 mg/24hr patch   ondansetron  (ZOFRAN ) 4 MG tablet   pantoprazole  (PROTONIX ) 40 MG tablet   potassium chloride  (KLOR-CON ) 10 MEQ tablet   rosuvastatin  (CRESTOR ) 20 MG tablet   sodium chloride  1 g tablet   traZODone  (DESYREL ) 100 MG tablet   VRAYLAR 1.5 MG capsule   No current facility-administered medications for this encounter.     Chick Cotton Ward, PA-C [facility] [phone]

## 2023-11-24 NOTE — Anesthesia Preprocedure Evaluation (Addendum)
 Anesthesia Evaluation    Reviewed: Allergy & Precautions, Patient's Chart, lab work & pertinent test results, reviewed documented beta blocker date and time   History of Anesthesia Complications Negative for: history of anesthetic complications  Airway Mallampati: II  TM Distance: >3 FB Neck ROM: Full    Dental no notable dental hx.    Pulmonary former smoker   Pulmonary exam normal        Cardiovascular hypertension, Pt. on medications and Pt. on home beta blockers + CAD and +CHF  Normal cardiovascular exam  Echo 03/08/2023: small perimembranous VSD with noted shunt on color flow doppler, EF 45-50%, moderate LAE, mild to moderate MR, mild to moderate TR, moderate AR, mild dilatation of the ascending aorta measuring 38mm, mild dilatation of the aortic root measuring 38mm    Neuro/Psych Seizures -,   Anxiety Depression Bipolar Disorder      GI/Hepatic Neg liver ROS, hiatal hernia,GERD  Medicated,,  Endo/Other  diabetes, Type 2, Oral Hypoglycemic AgentsHypothyroidism    Renal/GU negative Renal ROS  negative genitourinary   Musculoskeletal  (+) Arthritis ,    Abdominal   Peds  Hematology negative hematology ROS (+)   Anesthesia Other Findings Day of surgery medications reviewed with patient.  Reproductive/Obstetrics                              Anesthesia Physical Anesthesia Plan  ASA: 3  Anesthesia Plan: General   Post-op Pain Management: Tylenol  PO (pre-op)*   Induction: Intravenous  PONV Risk Score and Plan: 3 and Ondansetron , Dexamethasone , Treatment may vary due to age or medical condition and Midazolam   Airway Management Planned: Oral ETT  Additional Equipment: None  Intra-op Plan:   Post-operative Plan:   Informed Consent: I have reviewed the patients History and Physical, chart, labs and discussed the procedure including the risks, benefits and alternatives for the proposed  anesthesia with the patient or authorized representative who has indicated his/her understanding and acceptance.     Dental advisory given  Plan Discussed with: CRNA  Anesthesia Plan Comments: (See PAT note 11/23/2023)        Anesthesia Quick Evaluation

## 2023-11-29 ENCOUNTER — Encounter (HOSPITAL_COMMUNITY): Payer: Self-pay | Admitting: Surgery

## 2023-11-30 ENCOUNTER — Inpatient Hospital Stay (HOSPITAL_COMMUNITY): Payer: Self-pay | Admitting: Physician Assistant

## 2023-11-30 ENCOUNTER — Other Ambulatory Visit: Payer: Self-pay

## 2023-11-30 ENCOUNTER — Inpatient Hospital Stay (HOSPITAL_COMMUNITY): Payer: Self-pay | Admitting: Anesthesiology

## 2023-11-30 ENCOUNTER — Inpatient Hospital Stay (HOSPITAL_COMMUNITY)
Admission: RE | Admit: 2023-11-30 | Discharge: 2023-12-04 | DRG: 330 | Disposition: A | Discharge status: Home / Self Care | Source: Ambulatory Visit | Attending: Surgery | Admitting: Surgery

## 2023-11-30 ENCOUNTER — Encounter (HOSPITAL_COMMUNITY)
Admission: RE | Disposition: A | Discharge status: Home / Self Care | Payer: Self-pay | Source: Ambulatory Visit | Attending: Surgery

## 2023-11-30 ENCOUNTER — Encounter (HOSPITAL_COMMUNITY): Payer: Self-pay | Admitting: Surgery

## 2023-11-30 DIAGNOSIS — Z96643 Presence of artificial hip joint, bilateral: Secondary | ICD-10-CM | POA: Diagnosis present

## 2023-11-30 DIAGNOSIS — E7800 Pure hypercholesterolemia, unspecified: Secondary | ICD-10-CM | POA: Diagnosis present

## 2023-11-30 DIAGNOSIS — I2510 Atherosclerotic heart disease of native coronary artery without angina pectoris: Secondary | ICD-10-CM | POA: Diagnosis present

## 2023-11-30 DIAGNOSIS — I5042 Chronic combined systolic (congestive) and diastolic (congestive) heart failure: Secondary | ICD-10-CM | POA: Diagnosis present

## 2023-11-30 DIAGNOSIS — F419 Anxiety disorder, unspecified: Secondary | ICD-10-CM | POA: Diagnosis present

## 2023-11-30 DIAGNOSIS — I429 Cardiomyopathy, unspecified: Secondary | ICD-10-CM | POA: Diagnosis present

## 2023-11-30 DIAGNOSIS — Z79899 Other long term (current) drug therapy: Secondary | ICD-10-CM | POA: Diagnosis not present

## 2023-11-30 DIAGNOSIS — E119 Type 2 diabetes mellitus without complications: Secondary | ICD-10-CM

## 2023-11-30 DIAGNOSIS — Z79890 Hormone replacement therapy (postmenopausal): Secondary | ICD-10-CM

## 2023-11-30 DIAGNOSIS — Z7982 Long term (current) use of aspirin: Secondary | ICD-10-CM

## 2023-11-30 DIAGNOSIS — M810 Age-related osteoporosis without current pathological fracture: Secondary | ICD-10-CM | POA: Diagnosis present

## 2023-11-30 DIAGNOSIS — E039 Hypothyroidism, unspecified: Secondary | ICD-10-CM | POA: Diagnosis present

## 2023-11-30 DIAGNOSIS — Y831 Surgical operation with implant of artificial internal device as the cause of abnormal reaction of the patient, or of later complication, without mention of misadventure at the time of the procedure: Secondary | ICD-10-CM | POA: Diagnosis present

## 2023-11-30 DIAGNOSIS — Z7984 Long term (current) use of oral hypoglycemic drugs: Secondary | ICD-10-CM

## 2023-11-30 DIAGNOSIS — Z90710 Acquired absence of both cervix and uterus: Secondary | ICD-10-CM | POA: Diagnosis not present

## 2023-11-30 DIAGNOSIS — F3175 Bipolar disorder, in partial remission, most recent episode depressed: Secondary | ICD-10-CM | POA: Diagnosis present

## 2023-11-30 DIAGNOSIS — G8929 Other chronic pain: Secondary | ICD-10-CM | POA: Diagnosis present

## 2023-11-30 DIAGNOSIS — Z8249 Family history of ischemic heart disease and other diseases of the circulatory system: Secondary | ICD-10-CM

## 2023-11-30 DIAGNOSIS — Z823 Family history of stroke: Secondary | ICD-10-CM

## 2023-11-30 DIAGNOSIS — I110 Hypertensive heart disease with heart failure: Secondary | ICD-10-CM | POA: Diagnosis present

## 2023-11-30 DIAGNOSIS — I10 Essential (primary) hypertension: Secondary | ICD-10-CM | POA: Diagnosis present

## 2023-11-30 DIAGNOSIS — Z87891 Personal history of nicotine dependence: Secondary | ICD-10-CM

## 2023-11-30 DIAGNOSIS — K5790 Diverticulosis of intestine, part unspecified, without perforation or abscess without bleeding: Secondary | ICD-10-CM | POA: Diagnosis not present

## 2023-11-30 DIAGNOSIS — Q210 Ventricular septal defect: Secondary | ICD-10-CM

## 2023-11-30 DIAGNOSIS — Z981 Arthrodesis status: Secondary | ICD-10-CM

## 2023-11-30 DIAGNOSIS — Z96652 Presence of left artificial knee joint: Secondary | ICD-10-CM | POA: Diagnosis present

## 2023-11-30 DIAGNOSIS — T847XXA Infection and inflammatory reaction due to other internal orthopedic prosthetic devices, implants and grafts, initial encounter: Secondary | ICD-10-CM | POA: Diagnosis present

## 2023-11-30 DIAGNOSIS — N823 Fistula of vagina to large intestine: Principal | ICD-10-CM | POA: Diagnosis present

## 2023-11-30 DIAGNOSIS — Z9889 Other specified postprocedural states: Secondary | ICD-10-CM

## 2023-11-30 DIAGNOSIS — D638 Anemia in other chronic diseases classified elsewhere: Secondary | ICD-10-CM | POA: Diagnosis present

## 2023-11-30 DIAGNOSIS — D62 Acute posthemorrhagic anemia: Secondary | ICD-10-CM | POA: Diagnosis not present

## 2023-11-30 DIAGNOSIS — N824 Other female intestinal-genital tract fistulae: Secondary | ICD-10-CM | POA: Diagnosis present

## 2023-11-30 DIAGNOSIS — M978XXA Periprosthetic fracture around other internal prosthetic joint, initial encounter: Secondary | ICD-10-CM

## 2023-11-30 DIAGNOSIS — Z818 Family history of other mental and behavioral disorders: Secondary | ICD-10-CM

## 2023-11-30 DIAGNOSIS — Z96612 Presence of left artificial shoulder joint: Secondary | ICD-10-CM | POA: Diagnosis present

## 2023-11-30 DIAGNOSIS — F39 Unspecified mood [affective] disorder: Secondary | ICD-10-CM | POA: Diagnosis present

## 2023-11-30 DIAGNOSIS — M979XXA Periprosthetic fracture around unspecified internal prosthetic joint, initial encounter: Secondary | ICD-10-CM

## 2023-11-30 DIAGNOSIS — K660 Peritoneal adhesions (postprocedural) (postinfection): Secondary | ICD-10-CM | POA: Diagnosis present

## 2023-11-30 DIAGNOSIS — M1990 Unspecified osteoarthritis, unspecified site: Secondary | ICD-10-CM | POA: Diagnosis present

## 2023-11-30 DIAGNOSIS — N736 Female pelvic peritoneal adhesions (postinfective): Secondary | ICD-10-CM | POA: Diagnosis present

## 2023-11-30 DIAGNOSIS — K219 Gastro-esophageal reflux disease without esophagitis: Secondary | ICD-10-CM | POA: Diagnosis present

## 2023-11-30 DIAGNOSIS — Z96649 Presence of unspecified artificial hip joint: Secondary | ICD-10-CM

## 2023-11-30 HISTORY — PX: FLEXIBLE SIGMOIDOSCOPY: SHX5431

## 2023-11-30 HISTORY — PX: COLECTOMY, SIGMOID, ROBOT-ASSISTED: SHX7542

## 2023-11-30 HISTORY — PX: CYSTOSCOPY WITH INDOCYANINE GREEN IMAGING (ICG): SHX7549

## 2023-11-30 LAB — CBC WITH DIFFERENTIAL/PLATELET
Abs Immature Granulocytes: 0.1 10*3/uL — ABNORMAL HIGH (ref 0.00–0.07)
Basophils Absolute: 0 10*3/uL (ref 0.0–0.1)
Basophils Relative: 0 %
Eosinophils Absolute: 0 10*3/uL (ref 0.0–0.5)
Eosinophils Relative: 0 %
HCT: 33.4 % — ABNORMAL LOW (ref 36.0–46.0)
Hemoglobin: 9.8 g/dL — ABNORMAL LOW (ref 12.0–15.0)
Immature Granulocytes: 1 %
Lymphocytes Relative: 4 %
Lymphs Abs: 0.8 10*3/uL (ref 0.7–4.0)
MCH: 25.7 pg — ABNORMAL LOW (ref 26.0–34.0)
MCHC: 29.3 g/dL — ABNORMAL LOW (ref 30.0–36.0)
MCV: 87.7 fL (ref 80.0–100.0)
Monocytes Absolute: 0.6 10*3/uL (ref 0.1–1.0)
Monocytes Relative: 3 %
Neutro Abs: 17.3 10*3/uL — ABNORMAL HIGH (ref 1.7–7.7)
Neutrophils Relative %: 92 %
Platelets: 438 10*3/uL — ABNORMAL HIGH (ref 150–400)
RBC: 3.81 MIL/uL — ABNORMAL LOW (ref 3.87–5.11)
RDW: 19.5 % — ABNORMAL HIGH (ref 11.5–15.5)
WBC: 18.8 10*3/uL — ABNORMAL HIGH (ref 4.0–10.5)
nRBC: 0 % (ref 0.0–0.2)

## 2023-11-30 LAB — COMPREHENSIVE METABOLIC PANEL WITH GFR
ALT: 15 U/L (ref 0–44)
AST: 21 U/L (ref 15–41)
Albumin: 2.9 g/dL — ABNORMAL LOW (ref 3.5–5.0)
Alkaline Phosphatase: 49 U/L (ref 38–126)
Anion gap: 6 (ref 5–15)
BUN: <5 mg/dL — ABNORMAL LOW (ref 8–23)
CO2: 20 mmol/L — ABNORMAL LOW (ref 22–32)
Calcium: 7.5 mg/dL — ABNORMAL LOW (ref 8.9–10.3)
Chloride: 107 mmol/L (ref 98–111)
Creatinine, Ser: 0.4 mg/dL — ABNORMAL LOW (ref 0.44–1.00)
GFR, Estimated: >60 mL/min (ref >60)
Glucose, Bld: 199 mg/dL — ABNORMAL HIGH (ref 70–99)
Potassium: 3.9 mmol/L (ref 3.5–5.1)
Sodium: 133 mmol/L — ABNORMAL LOW (ref 135–145)
Total Bilirubin: 0.4 mg/dL (ref 0.0–1.2)
Total Protein: 6.3 g/dL — ABNORMAL LOW (ref 6.5–8.1)

## 2023-11-30 LAB — GLUCOSE, CAPILLARY
Glucose-Capillary: 109 mg/dL — ABNORMAL HIGH (ref 70–99)
Glucose-Capillary: 189 mg/dL — ABNORMAL HIGH (ref 70–99)

## 2023-11-30 SURGERY — COLECTOMY, SIGMOID, ROBOT-ASSISTED
Anesthesia: General

## 2023-11-30 MED ORDER — SODIUM CHLORIDE 0.9 % IV SOLN
2.0000 g | Freq: Two times a day (BID) | INTRAVENOUS | Status: AC
  Administered 2023-12-01: 2 g via INTRAVENOUS
  Filled 2023-11-30: qty 2

## 2023-11-30 MED ORDER — ENSURE PRE-SURGERY PO LIQD
296.0000 mL | Freq: Once | ORAL | Status: DC
Start: 2023-12-01 — End: 2023-11-30

## 2023-11-30 MED ORDER — ROCURONIUM BROMIDE 10 MG/ML (PF) SYRINGE
PREFILLED_SYRINGE | INTRAVENOUS | Status: AC
  Filled 2023-11-30: qty 10

## 2023-11-30 MED ORDER — ROSUVASTATIN CALCIUM 20 MG PO TABS
20.0000 mg | ORAL_TABLET | Freq: Every day | ORAL | Status: DC
Start: 2023-12-01 — End: 2023-12-04
  Administered 2023-12-01 – 2023-12-04 (×4): 20 mg via ORAL
  Filled 2023-11-30 (×4): qty 1

## 2023-11-30 MED ORDER — FAMOTIDINE 20 MG PO TABS
20.0000 mg | ORAL_TABLET | Freq: Every evening | ORAL | Status: DC
  Administered 2023-11-30 – 2023-12-03 (×4): 20 mg via ORAL
  Filled 2023-11-30 (×4): qty 1

## 2023-11-30 MED ORDER — METFORMIN HCL 500 MG PO TABS
500.0000 mg | ORAL_TABLET | Freq: Two times a day (BID) | ORAL | Status: DC
  Administered 2023-12-01 – 2023-12-04 (×7): 500 mg via ORAL
  Filled 2023-11-30 (×7): qty 1

## 2023-11-30 MED ORDER — HYDROMORPHONE HCL 1 MG/ML IJ SOLN
0.5000 mg | INTRAMUSCULAR | Status: DC | PRN
  Administered 2023-11-30 – 2023-12-01 (×5): 1 mg via INTRAVENOUS
  Filled 2023-11-30 (×5): qty 1

## 2023-11-30 MED ORDER — LACTATED RINGERS IV SOLN: INTRAVENOUS | Status: DC | PRN

## 2023-11-30 MED ORDER — DEXMEDETOMIDINE HCL IN NACL 80 MCG/20ML IV SOLN
INTRAVENOUS | Status: DC | PRN
  Administered 2023-11-30: 4 ug via INTRAVENOUS

## 2023-11-30 MED ORDER — LACTATED RINGERS IR SOLN
Status: DC | PRN
  Administered 2023-11-30: 1000 mL

## 2023-11-30 MED ORDER — LOSARTAN POTASSIUM 25 MG PO TABS
12.5000 mg | ORAL_TABLET | Freq: Every day | ORAL | Status: DC
  Administered 2023-12-01 – 2023-12-04 (×4): 12.5 mg via ORAL
  Filled 2023-11-30 (×4): qty 1

## 2023-11-30 MED ORDER — METHYLENE BLUE (ANTIDOTE) 1 % IV SOLN
INTRAVENOUS | Status: AC
  Filled 2023-11-30: qty 10

## 2023-11-30 MED ORDER — BUPIVACAINE-EPINEPHRINE (PF) 0.25% -1:200000 IJ SOLN
INTRAMUSCULAR | Status: DC | PRN
  Administered 2023-11-30: 30 mL

## 2023-11-30 MED ORDER — ONDANSETRON HCL 4 MG/2ML IJ SOLN
INTRAMUSCULAR | Status: AC
  Filled 2023-11-30: qty 2

## 2023-11-30 MED ORDER — ENSURE SURGERY PO LIQD
237.0000 mL | Freq: Two times a day (BID) | ORAL | Status: DC
  Administered 2023-12-01 – 2023-12-02 (×4): 237 mL via ORAL

## 2023-11-30 MED ORDER — ALUM & MAG HYDROXIDE-SIMETH 200-200-20 MG/5ML PO SUSP
30.0000 mL | Freq: Four times a day (QID) | ORAL | Status: DC | PRN
  Administered 2023-12-01: 30 mL via ORAL
  Filled 2023-11-30: qty 30

## 2023-11-30 MED ORDER — STERILE WATER FOR INJECTION IJ SOLN
INTRAMUSCULAR | Status: DC | PRN
Start: 2023-11-30 — End: 2023-11-30
  Administered 2023-11-30: 20 mL

## 2023-11-30 MED ORDER — GABAPENTIN 100 MG PO CAPS: 200.0000 mg | ORAL_CAPSULE | Freq: Every day | ORAL | Status: DC

## 2023-11-30 MED ORDER — MIDAZOLAM HCL 5 MG/5ML IJ SOLN
INTRAMUSCULAR | Status: DC | PRN
  Administered 2023-11-30: 2 mg via INTRAVENOUS

## 2023-11-30 MED ORDER — ADULT MULTIVITAMIN W/MINERALS CH
1.0000 | ORAL_TABLET | Freq: Every day | ORAL | Status: DC
  Administered 2023-12-01 – 2023-12-03 (×3): 1 via ORAL
  Filled 2023-11-30 (×4): qty 1

## 2023-11-30 MED ORDER — CALCIUM POLYCARBOPHIL 625 MG PO TABS
625.0000 mg | ORAL_TABLET | Freq: Two times a day (BID) | ORAL | Status: DC
  Administered 2023-11-30 – 2023-12-04 (×8): 625 mg via ORAL
  Filled 2023-11-30 (×8): qty 1

## 2023-11-30 MED ORDER — SODIUM CHLORIDE 0.9 % IR SOLN
Status: DC | PRN
  Administered 2023-11-30: 3500 mL

## 2023-11-30 MED ORDER — PROCHLORPERAZINE MALEATE 10 MG PO TABS: 10.0000 mg | ORAL_TABLET | Freq: Four times a day (QID) | ORAL | Status: DC | PRN

## 2023-11-30 MED ORDER — METOPROLOL TARTRATE 5 MG/5ML IV SOLN: 5.0000 mg | Freq: Four times a day (QID) | INTRAVENOUS | Status: DC | PRN

## 2023-11-30 MED ORDER — FLUVOXAMINE MALEATE 50 MG PO TABS
100.0000 mg | ORAL_TABLET | Freq: Every day | ORAL | Status: DC
  Administered 2023-11-30 – 2023-12-03 (×4): 100 mg via ORAL
  Filled 2023-11-30 (×4): qty 2

## 2023-11-30 MED ORDER — SODIUM CHLORIDE (PF) 0.9 % IJ SOLN
INTRAMUSCULAR | Status: DC | PRN
  Administered 2023-11-30: 6 mL

## 2023-11-30 MED ORDER — ONDANSETRON HCL 4 MG/2ML IJ SOLN
INTRAMUSCULAR | Status: DC | PRN
  Administered 2023-11-30: 4 mg via INTRAVENOUS

## 2023-11-30 MED ORDER — PANTOPRAZOLE SODIUM 40 MG PO TBEC
40.0000 mg | DELAYED_RELEASE_TABLET | Freq: Every morning | ORAL | Status: DC
  Administered 2023-12-01 – 2023-12-04 (×4): 40 mg via ORAL
  Filled 2023-11-30 (×4): qty 1

## 2023-11-30 MED ORDER — SIMETHICONE 80 MG PO CHEW
40.0000 mg | CHEWABLE_TABLET | Freq: Four times a day (QID) | ORAL | Status: DC | PRN
  Administered 2023-12-01: 40 mg via ORAL
  Filled 2023-11-30: qty 1

## 2023-11-30 MED ORDER — GABAPENTIN 100 MG PO CAPS
200.0000 mg | ORAL_CAPSULE | ORAL | Status: AC
Start: 2023-11-30 — End: 2023-11-30
  Administered 2023-11-30: 200 mg via ORAL
  Filled 2023-11-30: qty 2

## 2023-11-30 MED ORDER — HYDRALAZINE HCL 20 MG/ML IJ SOLN
10.0000 mg | INTRAMUSCULAR | Status: DC | PRN
  Administered 2023-12-04: 10 mg via INTRAVENOUS
  Filled 2023-11-30: qty 1

## 2023-11-30 MED ORDER — NAPHAZOLINE-GLYCERIN 0.012-0.25 % OP SOLN: 1.0000 [drp] | Freq: Four times a day (QID) | OPHTHALMIC | Status: DC | PRN

## 2023-11-30 MED ORDER — LEVOTHYROXINE SODIUM 88 MCG PO TABS
88.0000 ug | ORAL_TABLET | Freq: Every day | ORAL | Status: DC
  Administered 2023-12-01 – 2023-12-04 (×4): 88 ug via ORAL
  Filled 2023-11-30 (×4): qty 1

## 2023-11-30 MED ORDER — NICOTINE 14 MG/24HR TD PT24
14.0000 mg | MEDICATED_PATCH | Freq: Every day | TRANSDERMAL | Status: DC
  Administered 2023-11-30 – 2023-12-04 (×5): 14 mg via TRANSDERMAL
  Filled 2023-11-30 (×5): qty 1

## 2023-11-30 MED ORDER — BUPIVACAINE-EPINEPHRINE (PF) 0.25% -1:200000 IJ SOLN
INTRAMUSCULAR | Status: AC
  Filled 2023-11-30: qty 60

## 2023-11-30 MED ORDER — ONDANSETRON HCL 4 MG/2ML IJ SOLN
4.0000 mg | Freq: Four times a day (QID) | INTRAMUSCULAR | Status: DC | PRN
  Administered 2023-11-30 – 2023-12-03 (×4): 4 mg via INTRAVENOUS
  Filled 2023-11-30 (×4): qty 2

## 2023-11-30 MED ORDER — ENOXAPARIN SODIUM 40 MG/0.4ML IJ SOSY
40.0000 mg | PREFILLED_SYRINGE | INTRAMUSCULAR | Status: DC
  Administered 2023-12-01 – 2023-12-04 (×4): 40 mg via SUBCUTANEOUS
  Filled 2023-11-30 (×4): qty 0.4

## 2023-11-30 MED ORDER — 0.9 % SODIUM CHLORIDE (POUR BTL) OPTIME
TOPICAL | Status: DC | PRN
  Administered 2023-11-30: 1000 mL

## 2023-11-30 MED ORDER — CYCLOBENZAPRINE HCL 10 MG PO TABS
10.0000 mg | ORAL_TABLET | Freq: Three times a day (TID) | ORAL | Status: DC
  Administered 2023-11-30 – 2023-12-04 (×11): 10 mg via ORAL
  Filled 2023-11-30 (×11): qty 1

## 2023-11-30 MED ORDER — 0.9 % SODIUM CHLORIDE (POUR BTL) OPTIME
TOPICAL | Status: DC | PRN
  Administered 2023-11-30: 2000 mL

## 2023-11-30 MED ORDER — LIDOCAINE 2% (20 MG/ML) 5 ML SYRINGE
INTRAMUSCULAR | Status: DC | PRN
  Administered 2023-11-30: 60 mg via INTRAVENOUS

## 2023-11-30 MED ORDER — ENSURE PRE-SURGERY PO LIQD
592.0000 mL | Freq: Once | ORAL | Status: DC
Start: 2023-11-30 — End: 2023-11-30

## 2023-11-30 MED ORDER — DONEPEZIL HCL 10 MG PO TABS
10.0000 mg | ORAL_TABLET | Freq: Every morning | ORAL | Status: DC
  Administered 2023-12-01 – 2023-12-04 (×4): 10 mg via ORAL
  Filled 2023-11-30 (×4): qty 1

## 2023-11-30 MED ORDER — MENTHOL 3 MG MT LOZG: 1.0000 | LOZENGE | OROMUCOSAL | Status: DC | PRN

## 2023-11-30 MED ORDER — KCL IN DEXTROSE-NACL 20-5-0.45 MEQ/L-%-% IV SOLN
INTRAVENOUS | Status: DC
  Filled 2023-11-30: qty 1000

## 2023-11-30 MED ORDER — NEOMYCIN SULFATE 500 MG PO TABS: 1000.0000 mg | ORAL_TABLET | ORAL | Status: DC

## 2023-11-30 MED ORDER — RISAQUAD PO CAPS
1.0000 | ORAL_CAPSULE | Freq: Every day | ORAL | Status: DC
  Administered 2023-12-01 – 2023-12-04 (×4): 1 via ORAL
  Filled 2023-11-30 (×4): qty 1

## 2023-11-30 MED ORDER — BUPIVACAINE LIPOSOME 1.3 % IJ SUSP
INTRAMUSCULAR | Status: AC
  Filled 2023-11-30: qty 20

## 2023-11-30 MED ORDER — STERILE WATER FOR INJECTION IJ SOLN
INTRAMUSCULAR | Status: AC
  Filled 2023-11-30: qty 10

## 2023-11-30 MED ORDER — TRAZODONE HCL 100 MG PO TABS
100.0000 mg | ORAL_TABLET | Freq: Every day | ORAL | Status: DC
  Administered 2023-11-30 – 2023-12-03 (×4): 100 mg via ORAL
  Filled 2023-11-30 (×4): qty 1

## 2023-11-30 MED ORDER — ORAL CARE MOUTH RINSE: 15.0000 mL | Freq: Once | OROMUCOSAL | Status: AC

## 2023-11-30 MED ORDER — DIPHENHYDRAMINE HCL 12.5 MG/5ML PO ELIX
12.5000 mg | ORAL_SOLUTION | Freq: Four times a day (QID) | ORAL | Status: DC | PRN
  Administered 2023-11-30: 12.5 mg via ORAL
  Filled 2023-11-30: qty 5

## 2023-11-30 MED ORDER — ROCURONIUM BROMIDE 10 MG/ML (PF) SYRINGE
PREFILLED_SYRINGE | INTRAVENOUS | Status: DC | PRN
  Administered 2023-11-30: 10 mg via INTRAVENOUS
  Administered 2023-11-30: 50 mg via INTRAVENOUS
  Administered 2023-11-30 (×2): 10 mg via INTRAVENOUS
  Administered 2023-11-30 (×2): 20 mg via INTRAVENOUS

## 2023-11-30 MED ORDER — CARIPRAZINE HCL 1.5 MG PO CAPS
1.5000 mg | ORAL_CAPSULE | Freq: Every day | ORAL | Status: DC
  Administered 2023-11-30 – 2023-12-03 (×4): 1.5 mg via ORAL
  Filled 2023-11-30 (×4): qty 1

## 2023-11-30 MED ORDER — EMPAGLIFLOZIN 10 MG PO TABS
10.0000 mg | ORAL_TABLET | Freq: Every day | ORAL | Status: DC
  Administered 2023-12-01 – 2023-12-04 (×4): 10 mg via ORAL
  Filled 2023-11-30 (×4): qty 1

## 2023-11-30 MED ORDER — DROPERIDOL 2.5 MG/ML IJ SOLN: 0.6250 mg | Freq: Once | INTRAMUSCULAR | Status: DC | PRN

## 2023-11-30 MED ORDER — ALVIMOPAN 12 MG PO CAPS
12.0000 mg | ORAL_CAPSULE | ORAL | Status: AC
  Administered 2023-11-30: 12 mg via ORAL
  Filled 2023-11-30: qty 1

## 2023-11-30 MED ORDER — ASPIRIN 81 MG PO TBEC
81.0000 mg | DELAYED_RELEASE_TABLET | Freq: Every day | ORAL | Status: DC
  Administered 2023-11-30 – 2023-12-04 (×5): 81 mg via ORAL
  Filled 2023-11-30 (×5): qty 1

## 2023-11-30 MED ORDER — PROPOFOL 10 MG/ML IV BOLUS
INTRAVENOUS | Status: DC | PRN
  Administered 2023-11-30: 120 mg via INTRAVENOUS

## 2023-11-30 MED ORDER — METRONIDAZOLE 500 MG PO TABS: 1000.0000 mg | ORAL_TABLET | ORAL | Status: DC

## 2023-11-30 MED ORDER — MIDAZOLAM HCL 2 MG/2ML IJ SOLN
INTRAMUSCULAR | Status: AC
  Filled 2023-11-30: qty 2

## 2023-11-30 MED ORDER — CHLORHEXIDINE GLUCONATE 0.12 % MT SOLN
15.0000 mL | Freq: Once | OROMUCOSAL | Status: AC
  Administered 2023-11-30: 15 mL via OROMUCOSAL

## 2023-11-30 MED ORDER — ENOXAPARIN SODIUM 40 MG/0.4ML IJ SOSY
40.0000 mg | PREFILLED_SYRINGE | Freq: Once | INTRAMUSCULAR | Status: AC
  Administered 2023-11-30: 40 mg via SUBCUTANEOUS
  Filled 2023-11-30: qty 0.4

## 2023-11-30 MED ORDER — LAMOTRIGINE 100 MG PO TABS
200.0000 mg | ORAL_TABLET | Freq: Every day | ORAL | Status: DC
  Administered 2023-11-30 – 2023-12-03 (×4): 200 mg via ORAL
  Filled 2023-11-30 (×4): qty 2

## 2023-11-30 MED ORDER — TRAMADOL HCL 50 MG PO TABS
50.0000 mg | ORAL_TABLET | Freq: Four times a day (QID) | ORAL | Status: DC | PRN
  Administered 2023-11-30 – 2023-12-01 (×2): 100 mg via ORAL
  Filled 2023-11-30 (×3): qty 2

## 2023-11-30 MED ORDER — DEXAMETHASONE SODIUM PHOSPHATE 10 MG/ML IJ SOLN
INTRAMUSCULAR | Status: DC | PRN
  Administered 2023-11-30: 10 mg via INTRAVENOUS

## 2023-11-30 MED ORDER — DEXAMETHASONE SODIUM PHOSPHATE 10 MG/ML IJ SOLN
INTRAMUSCULAR | Status: AC
  Filled 2023-11-30: qty 1

## 2023-11-30 MED ORDER — PHENOL 1.4 % MT LIQD: 2.0000 | OROMUCOSAL | Status: DC | PRN

## 2023-11-30 MED ORDER — SALINE SPRAY 0.65 % NA SOLN: 1.0000 | Freq: Four times a day (QID) | NASAL | Status: DC | PRN

## 2023-11-30 MED ORDER — EPHEDRINE 5 MG/ML INJ
INTRAVENOUS | Status: AC
  Filled 2023-11-30: qty 10

## 2023-11-30 MED ORDER — SODIUM CHLORIDE 0.9 % IV SOLN: 250.0000 mL | INTRAVENOUS | Status: DC | PRN

## 2023-11-30 MED ORDER — KETAMINE HCL 10 MG/ML IJ SOLN
INTRAMUSCULAR | Status: DC | PRN
  Administered 2023-11-30: 10 mg via INTRAVENOUS
  Administered 2023-11-30 (×2): 20 mg via INTRAVENOUS

## 2023-11-30 MED ORDER — SODIUM CHLORIDE 0.9 % IV SOLN
2.0000 g | INTRAVENOUS | Status: AC
  Administered 2023-11-30: 2 g via INTRAVENOUS
  Filled 2023-11-30: qty 2

## 2023-11-30 MED ORDER — BUPIVACAINE LIPOSOME 1.3 % IJ SUSP
INTRAMUSCULAR | Status: DC | PRN
  Administered 2023-11-30: 20 mL

## 2023-11-30 MED ORDER — SODIUM CHLORIDE 1 G PO TABS
1.0000 g | ORAL_TABLET | Freq: Two times a day (BID) | ORAL | Status: DC
  Administered 2023-12-01 – 2023-12-04 (×7): 1 g via ORAL
  Filled 2023-11-30 (×7): qty 1

## 2023-11-30 MED ORDER — DIPHENHYDRAMINE HCL 50 MG/ML IJ SOLN: 12.5000 mg | Freq: Four times a day (QID) | INTRAMUSCULAR | Status: DC | PRN

## 2023-11-30 MED ORDER — POTASSIUM CHLORIDE CRYS ER 20 MEQ PO TBCR
20.0000 meq | EXTENDED_RELEASE_TABLET | Freq: Three times a day (TID) | ORAL | Status: DC
  Administered 2023-11-30 – 2023-12-03 (×10): 20 meq via ORAL
  Filled 2023-11-30 (×2): qty 2
  Filled 2023-11-30: qty 1
  Filled 2023-11-30 (×10): qty 2
  Filled 2023-11-30 (×2): qty 1
  Filled 2023-11-30 (×4): qty 2

## 2023-11-30 MED ORDER — POLYETHYLENE GLYCOL 3350 17 GM/SCOOP PO POWD
238.0000 g | Freq: Once | ORAL | Status: DC
Start: 2023-11-30 — End: 2023-11-30

## 2023-11-30 MED ORDER — FENOPROFEN CALCIUM 600 MG PO TABS: 600.0000 mg | ORAL_TABLET | Freq: Two times a day (BID) | ORAL | Status: DC

## 2023-11-30 MED ORDER — SODIUM CHLORIDE 0.9% FLUSH: 3.0000 mL | INTRAVENOUS | Status: DC | PRN

## 2023-11-30 MED ORDER — MELATONIN 3 MG PO TABS: 3.0000 mg | ORAL_TABLET | Freq: Every evening | ORAL | Status: DC | PRN

## 2023-11-30 MED ORDER — ACETAMINOPHEN 500 MG PO TABS
1000.0000 mg | ORAL_TABLET | ORAL | Status: AC
  Administered 2023-11-30: 1000 mg via ORAL
  Filled 2023-11-30: qty 2

## 2023-11-30 MED ORDER — ESTRADIOL 0.1 MG/GM VA CREA
1.0000 | TOPICAL_CREAM | Freq: Every day | VAGINAL | Status: DC
Start: 2023-11-30 — End: 2023-11-30

## 2023-11-30 MED ORDER — MAGIC MOUTHWASH: 15.0000 mL | Freq: Four times a day (QID) | ORAL | Status: DC | PRN

## 2023-11-30 MED ORDER — INDOCYANINE GREEN 25 MG IV SOLR
INTRAVENOUS | Status: DC | PRN
  Administered 2023-11-30: 7.5 mg via INTRAVENOUS

## 2023-11-30 MED ORDER — LACTATED RINGERS IV SOLN: Freq: Three times a day (TID) | INTRAVENOUS | Status: AC | PRN

## 2023-11-30 MED ORDER — METHYLENE BLUE (ANTIDOTE) 1 % IV SOLN
INTRAVENOUS | Status: DC | PRN
  Administered 2023-11-30: 6 mL

## 2023-11-30 MED ORDER — BISACODYL 5 MG PO TBEC
20.0000 mg | DELAYED_RELEASE_TABLET | Freq: Once | ORAL | Status: DC
Start: 2023-11-30 — End: 2023-11-30
  Administered 2023-11-29: 20 mg via ORAL

## 2023-11-30 MED ORDER — ONDANSETRON HCL 4 MG PO TABS
4.0000 mg | ORAL_TABLET | Freq: Four times a day (QID) | ORAL | Status: DC | PRN
  Administered 2023-12-03: 4 mg via ORAL
  Filled 2023-11-30: qty 1

## 2023-11-30 MED ORDER — CARVEDILOL 25 MG PO TABS
25.0000 mg | ORAL_TABLET | Freq: Two times a day (BID) | ORAL | Status: DC
  Administered 2023-12-01 – 2023-12-04 (×7): 25 mg via ORAL
  Filled 2023-11-30 (×7): qty 1

## 2023-11-30 MED ORDER — DEXMEDETOMIDINE HCL IN NACL 80 MCG/20ML IV SOLN
INTRAVENOUS | Status: AC
  Filled 2023-11-30: qty 20

## 2023-11-30 MED ORDER — LACTATED RINGERS IV SOLN: INTRAVENOUS | Status: DC

## 2023-11-30 MED ORDER — BUPROPION HCL ER (XL) 150 MG PO TB24
150.0000 mg | ORAL_TABLET | Freq: Every day | ORAL | Status: DC
  Administered 2023-12-01 – 2023-12-04 (×4): 150 mg via ORAL
  Filled 2023-11-30 (×4): qty 1

## 2023-11-30 MED ORDER — FENTANYL CITRATE (PF) 100 MCG/2ML IJ SOLN
INTRAMUSCULAR | Status: AC
Start: 2023-11-30 — End: ?
  Filled 2023-11-30: qty 2

## 2023-11-30 MED ORDER — PHENYLEPHRINE HCL-NACL 20-0.9 MG/250ML-% IV SOLN
INTRAVENOUS | Status: DC | PRN
  Administered 2023-11-30: 30 ug/min via INTRAVENOUS

## 2023-11-30 MED ORDER — INSULIN ASPART 100 UNIT/ML IJ SOLN: 0.0000 [IU] | INTRAMUSCULAR | Status: DC | PRN

## 2023-11-30 MED ORDER — EPHEDRINE SULFATE-NACL 50-0.9 MG/10ML-% IV SOSY
PREFILLED_SYRINGE | INTRAVENOUS | Status: DC | PRN
  Administered 2023-11-30 (×3): 5 mg via INTRAVENOUS

## 2023-11-30 MED ORDER — BUSPIRONE HCL 5 MG PO TABS
7.5000 mg | ORAL_TABLET | Freq: Two times a day (BID) | ORAL | Status: DC
  Administered 2023-11-30 – 2023-12-04 (×8): 7.5 mg via ORAL
  Filled 2023-11-30 (×8): qty 2

## 2023-11-30 MED ORDER — ACETAMINOPHEN 500 MG PO TABS
1000.0000 mg | ORAL_TABLET | Freq: Four times a day (QID) | ORAL | Status: DC
  Administered 2023-11-30 – 2023-12-04 (×14): 1000 mg via ORAL
  Filled 2023-11-30 (×14): qty 2

## 2023-11-30 MED ORDER — HYDROMORPHONE HCL 1 MG/ML IJ SOLN
0.2500 mg | INTRAMUSCULAR | Status: DC | PRN
  Administered 2023-11-30 (×4): 0.5 mg via INTRAVENOUS

## 2023-11-30 MED ORDER — HYDROMORPHONE HCL 1 MG/ML IJ SOLN
INTRAMUSCULAR | Status: AC
  Filled 2023-11-30: qty 1

## 2023-11-30 MED ORDER — SODIUM CHLORIDE 0.9 % IR SOLN
Status: DC | PRN
  Administered 2023-11-30: 1000 mL

## 2023-11-30 MED ORDER — FENTANYL CITRATE (PF) 100 MCG/2ML IJ SOLN
INTRAMUSCULAR | Status: DC | PRN
  Administered 2023-11-30 (×2): 50 ug via INTRAVENOUS

## 2023-11-30 MED ORDER — SUGAMMADEX SODIUM 200 MG/2ML IV SOLN
INTRAVENOUS | Status: DC | PRN
  Administered 2023-11-30: 200 mg via INTRAVENOUS

## 2023-11-30 MED ORDER — ROCURONIUM BROMIDE 10 MG/ML (PF) SYRINGE
PREFILLED_SYRINGE | INTRAVENOUS | Status: AC
  Filled 2023-11-30: qty 20

## 2023-11-30 MED ORDER — PROCHLORPERAZINE EDISYLATE 10 MG/2ML IJ SOLN: 5.0000 mg | Freq: Four times a day (QID) | INTRAMUSCULAR | Status: DC | PRN

## 2023-11-30 MED ORDER — ALVIMOPAN 12 MG PO CAPS
12.0000 mg | ORAL_CAPSULE | Freq: Two times a day (BID) | ORAL | Status: DC
  Administered 2023-12-01 – 2023-12-03 (×4): 12 mg via ORAL
  Filled 2023-11-30 (×5): qty 1

## 2023-11-30 MED ORDER — SODIUM CHLORIDE 0.9% FLUSH
3.0000 mL | Freq: Two times a day (BID) | INTRAVENOUS | Status: DC
  Administered 2023-11-30 – 2023-12-04 (×8): 3 mL via INTRAVENOUS

## 2023-11-30 MED ORDER — VITAMIN C 500 MG PO TABS
500.0000 mg | ORAL_TABLET | Freq: Every day | ORAL | Status: DC
Start: 2023-12-01 — End: 2023-12-04
  Administered 2023-12-01 – 2023-12-03 (×3): 500 mg via ORAL
  Filled 2023-11-30 (×4): qty 1

## 2023-11-30 MED ORDER — OYSTER SHELL CALCIUM/D3 500-5 MG-MCG PO TABS
1.0000 | ORAL_TABLET | Freq: Two times a day (BID) | ORAL | Status: DC
  Administered 2023-11-30 – 2023-12-03 (×7): 1 via ORAL
  Filled 2023-11-30 (×8): qty 1

## 2023-11-30 MED ORDER — KETAMINE HCL 50 MG/5ML IJ SOSY
PREFILLED_SYRINGE | INTRAMUSCULAR | Status: AC
  Filled 2023-11-30: qty 5

## 2023-11-30 MED ORDER — BUPIVACAINE LIPOSOME 1.3 % IJ SUSP: 20.0000 mL | Freq: Once | INTRAMUSCULAR | Status: DC

## 2023-11-30 MED ORDER — GABAPENTIN 100 MG PO CAPS
100.0000 mg | ORAL_CAPSULE | Freq: Three times a day (TID) | ORAL | Status: DC
  Administered 2023-11-30 – 2023-12-04 (×11): 100 mg via ORAL
  Filled 2023-11-30 (×11): qty 1

## 2023-11-30 MED ORDER — LIDOCAINE HCL (PF) 2 % IJ SOLN
INTRAMUSCULAR | Status: AC
  Filled 2023-11-30: qty 5

## 2023-11-30 MED ORDER — MELATONIN 5 MG PO TABS
10.0000 mg | ORAL_TABLET | Freq: Every day | ORAL | Status: DC
  Administered 2023-11-30 – 2023-12-03 (×4): 10 mg via ORAL
  Filled 2023-11-30 (×4): qty 2

## 2023-11-30 MED ORDER — VITAMIN D 25 MCG (1000 UNIT) PO TABS
2000.0000 [IU] | ORAL_TABLET | Freq: Every day | ORAL | Status: DC
  Administered 2023-12-01 – 2023-12-03 (×3): 2000 [IU] via ORAL
  Filled 2023-11-30 (×4): qty 2

## 2023-11-30 SURGICAL SUPPLY — 87 items
BAG COUNTER SPONGE SURGICOUNT (BAG) ×2 IMPLANT
BLADE EXTENDED COATED 6.5IN (ELECTRODE) IMPLANT
CANNULA REDUCER 12-8 DVNC XI (CANNULA) IMPLANT
CELLS DAT CNTRL 66122 CELL SVR (MISCELLANEOUS) IMPLANT
CHLORAPREP W/TINT 26 (MISCELLANEOUS) IMPLANT
CLIP APPLIE 5 13 M/L LIGAMAX5 (MISCELLANEOUS) IMPLANT
CLIP APPLIE ROT 10 11.4 M/L (STAPLE) IMPLANT
COVER SURGICAL LIGHT HANDLE (MISCELLANEOUS) ×4 IMPLANT
COVER TIP SHEARS 8 DVNC (MISCELLANEOUS) ×2 IMPLANT
DEFOGGER SCOPE WARM SEASHARP (MISCELLANEOUS) ×2 IMPLANT
DEVICE TROCAR PUNCTURE CLOSURE (ENDOMECHANICALS) IMPLANT
DRAIN CHANNEL 19F RND (DRAIN) IMPLANT
DRAPE ARM DVNC X/XI (DISPOSABLE) ×8 IMPLANT
DRAPE COLUMN DVNC XI (DISPOSABLE) ×2 IMPLANT
DRAPE CV SPLIT W-CLR ANES SCRN (DRAPES) ×2 IMPLANT
DRAPE PERI GROIN 82X75IN TIB (DRAPES) ×2 IMPLANT
DRAPE SURG IRRIG POUCH 19X23 (DRAPES) ×2 IMPLANT
DRIVER NDL LRG 8 DVNC XI (INSTRUMENTS) ×2 IMPLANT
DRIVER NDLE LRG 8 DVNC XI (INSTRUMENTS) ×1 IMPLANT
DRSG OPSITE POSTOP 4X10 (GAUZE/BANDAGES/DRESSINGS) IMPLANT
DRSG OPSITE POSTOP 4X6 (GAUZE/BANDAGES/DRESSINGS) IMPLANT
DRSG OPSITE POSTOP 4X8 (GAUZE/BANDAGES/DRESSINGS) IMPLANT
DRSG TEGADERM 2-3/8X2-3/4 SM (GAUZE/BANDAGES/DRESSINGS) ×10 IMPLANT
DRSG TEGADERM 4X4.75 (GAUZE/BANDAGES/DRESSINGS) IMPLANT
ELECT PENCIL ROCKER SW 15FT (MISCELLANEOUS) ×2 IMPLANT
ELECT REM PT RETURN 15FT ADLT (MISCELLANEOUS) ×2 IMPLANT
ENDOLOOP SUT PDS II 0 18 (SUTURE) IMPLANT
EVACUATOR SILICONE 100CC (DRAIN) IMPLANT
GAUZE SPONGE 2X2 8PLY STRL LF (GAUZE/BANDAGES/DRESSINGS) ×2 IMPLANT
GLOVE ECLIPSE 8.0 STRL XLNG CF (GLOVE) ×6 IMPLANT
GLOVE INDICATOR 8.0 STRL GRN (GLOVE) ×6 IMPLANT
GOWN SRG XL LVL 4 BRTHBL STRL (GOWNS) ×2 IMPLANT
GOWN STRL REUS W/ TWL XL LVL3 (GOWN DISPOSABLE) ×8 IMPLANT
GRASPER SUT TROCAR 14GX15 (MISCELLANEOUS) IMPLANT
GRASPER TIP-UP FEN DVNC XI (INSTRUMENTS) ×2 IMPLANT
GUIDEWIRE STR DUAL SENSOR (WIRE) IMPLANT
HOLDER FOLEY CATH W/STRAP (MISCELLANEOUS) ×2 IMPLANT
IRRIGATION SUCT STRKRFLW 2 WTP (MISCELLANEOUS) ×2 IMPLANT
KIT PROCEDURE DVNC SI (MISCELLANEOUS) IMPLANT
KIT SIGMOIDOSCOPE (SET/KITS/TRAYS/PACK) IMPLANT
KIT TURNOVER KIT A (KITS) IMPLANT
LOOP CUT BIPOLAR 24F LRG (ELECTROSURGICAL) IMPLANT
NDL INSUFFLATION 14GA 120MM (NEEDLE) ×2 IMPLANT
NEEDLE INSUFFLATION 14GA 120MM (NEEDLE) ×1 IMPLANT
PACK COLON (CUSTOM PROCEDURE TRAY) ×2 IMPLANT
PAD POSITIONING PINK XL (MISCELLANEOUS) ×2 IMPLANT
PROTECTOR NERVE ULNAR (MISCELLANEOUS) ×4 IMPLANT
RELOAD STAPLE 45 3.5 BLU DVNC (STAPLE) IMPLANT
RELOAD STAPLE 45 4.3 GRN DVNC (STAPLE) IMPLANT
RELOAD STAPLE 60 3.5 BLU DVNC (STAPLE) IMPLANT
RELOAD STAPLE 60 4.3 GRN DVNC (STAPLE) IMPLANT
RELOAD STAPLER 3.5X45 BLU DVNC (STAPLE) IMPLANT
RELOAD STAPLER 3.5X60 BLU DVNC (STAPLE) IMPLANT
RELOAD STAPLER 4.3X45 GRN DVNC (STAPLE) IMPLANT
RELOAD STAPLER 4.3X60 GRN DVNC (STAPLE) ×1 IMPLANT
RETRACTOR WND ALEXIS 18 MED (MISCELLANEOUS) IMPLANT
SCISSORS LAP 5X35 DISP (ENDOMECHANICALS) ×2 IMPLANT
SCISSORS MNPLR CVD DVNC XI (INSTRUMENTS) ×2 IMPLANT
SEAL UNIV 5-12 XI (MISCELLANEOUS) ×8 IMPLANT
SEALER VESSEL EXT DVNC XI (MISCELLANEOUS) ×2 IMPLANT
SOLUTION ELECTROSURG ANTI STCK (MISCELLANEOUS) ×2 IMPLANT
SPIKE FLUID TRANSFER (MISCELLANEOUS) ×2 IMPLANT
STAPLER 45 SUREFORM DVNC (STAPLE) IMPLANT
STAPLER 60 SUREFORM DVNC (STAPLE) IMPLANT
STAPLER ECHELON POWER CIR 29 (STAPLE) IMPLANT
STAPLER ECHELON POWER CIR 31 (STAPLE) IMPLANT
STOPCOCK 4 WAY LG BORE MALE ST (IV SETS) ×4 IMPLANT
SURGILUBE 2OZ TUBE FLIPTOP (MISCELLANEOUS) IMPLANT
SUT MNCRL AB 4-0 PS2 18 (SUTURE) ×2 IMPLANT
SUT PDS AB 1 CT1 27 (SUTURE) ×4 IMPLANT
SUT PROLENE 0 CT 2 (SUTURE) IMPLANT
SUT PROLENE 2 0 KS (SUTURE) IMPLANT
SUT PROLENE 2 0 SH DA (SUTURE) IMPLANT
SUT SILK 2 0 SH CR/8 (SUTURE) IMPLANT
SUT SILK 3 0 SH CR/8 (SUTURE) ×2 IMPLANT
SUT VIC AB 2-0 UR6 27 (SUTURE) IMPLANT
SUT VIC AB 3-0 SH 18 (SUTURE) IMPLANT
SUT VIC AB 3-0 SH 27XBRD (SUTURE) IMPLANT
SUT VICRYL 0 UR6 27IN ABS (SUTURE) IMPLANT
SUTURE V-LC BRB 180 2/0GR6GS22 (SUTURE) IMPLANT
SYR 20ML ECCENTRIC (SYRINGE) ×2 IMPLANT
SYSTEM LAPSCP GELPORT 120MM (MISCELLANEOUS) IMPLANT
SYSTEM WOUND ALEXIS 18CM MED (MISCELLANEOUS) ×2 IMPLANT
TRAY FOLEY MTR SLVR 16FR STAT (SET/KITS/TRAYS/PACK) ×2 IMPLANT
TROCAR ADV FIXATION 5X100MM (TROCAR) ×2 IMPLANT
TUBING CONNECTING 10 (TUBING) ×4 IMPLANT
TUBING INSUFFLATION 10FT LAP (TUBING) ×2 IMPLANT

## 2023-11-30 NOTE — Anesthesia Postprocedure Evaluation (Signed)
 Anesthesia Post Note  Patient: [name]  Procedure(s) Performed: COLECTOMY, SIGMOID, ROBOT-ASSISTED ROBOTIC LOW ANTERIOR RECTOSIGMOID RESECTION (LAR) WITH ANASTOMOSIS COLOVAGINAL FISTULA TAKEDOWN & REPAIR OMENTOPEXY OF VAGINA INTRAOPERATIVE ASSESSMENT OF TISSUE VASCULAR PERFUSION USING ICG (indocyanine green) IMMUNOFLUORESCENCE TRANSVERSUS ABDOMINIS PLANE (TAP) BLOCK - BILATERAL SIGMOIDOSCOPY, FLEXIBLE Cystoscopy with bilateral ureteral FireFly injections Transurethral resection of bladder tumor--large     Patient location during evaluation: PACU Anesthesia Type: General Level of consciousness: awake and alert Pain management: pain level controlled Vital Signs Assessment: post-procedure vital signs reviewed and stable Respiratory status: spontaneous breathing, nonlabored ventilation and respiratory function stable Cardiovascular status: blood pressure returned to baseline Postop Assessment: no apparent nausea or vomiting Anesthetic complications: no   No notable events documented.           Rayfield Cairo

## 2023-11-30 NOTE — Discharge Instructions (Signed)
 SURGERY: POST OP INSTRUCTIONS (Surgery for small bowel obstruction, colon resection, etc)   ######################################################################  EAT Gradually transition to a high fiber diet with a fiber supplement over the next few days after discharge  WALK Walk an hour a day.  Control your pain to do that.    CONTROL PAIN Control pain so that you can walk, sleep, tolerate sneezing/coughing, go up/down stairs.  HAVE A BOWEL MOVEMENT DAILY Keep your bowels regular to avoid problems.  OK to try a laxative to override constipation.  OK to use an antidairrheal to slow down diarrhea.  Call if not better after 2 tries  CALL IF YOU HAVE PROBLEMS/CONCERNS Call if you are still struggling despite following these instructions. Call if you have concerns not answered by these instructions  ######################################################################   DIET Follow a light diet the first few days at home.  Start with a bland diet such as soups, liquids, starchy foods, low fat foods, etc.  If you feel full, bloated, or constipated, stay on a ful liquid or pureed/blenderized diet for a few days until you feel better and no longer constipated. Be sure to drink plenty of fluids every day to avoid getting dehydrated (feeling dizzy, not urinating, etc.). Gradually add a fiber supplement to your diet over the next week.  Gradually get back to a regular solid diet.  Avoid fast food or heavy meals the first week as you are more likely to get nauseated. It is expected for your digestive tract to need a few months to get back to normal.  It is common for your bowel movements and stools to be irregular.  You will have occasional bloating and cramping that should eventually fade away.  Until you are eating solid food normally, off all pain medications, and back to regular activities; your bowels will not be normal. Focus on eating a low-fat, high fiber diet the rest of your life  (See Getting to Good Bowel Health, below).  CARE of your INCISION or WOUND  It is good for closed incisions and even open wounds to be washed every day.  Shower every day.  Short baths are fine.  Wash the incisions and wounds clean with soap & water.    You may leave closed incisions open to air if it is dry.   You may cover the incision with clean gauze & replace it after your daily shower for comfort.  TEGADERM:  You have clear gauze band-aid dressings over your closed incision(s).  Remove the dressings 2 days after surgery = 5/16.    If you have an open wound with a wound vac, see wound vac care instructions.    ACTIVITIES as tolerated Start light daily activities --- self-care, walking, climbing stairs-- beginning the day after surgery.  Gradually increase activities as tolerated.  Control your pain to be active.  Stop when you are tired.  Ideally, walk several times a day, eventually an hour a day.   Most people are back to most day-to-day activities in a few weeks.  It takes 4-8 weeks to get back to unrestricted, intense activity. If you can walk 30 minutes without difficulty, it is safe to try more intense activity such as jogging, treadmill, bicycling, low-impact aerobics, swimming, etc. Save the most intensive and strenuous activity for last (Usually 4-8 weeks after surgery) such as sit-ups, heavy lifting, contact sports, etc.  Refrain from any intense heavy lifting or straining until you are off narcotics for pain control.  You will have off  days, but things should improve week-by-week. DO NOT PUSH THROUGH PAIN.  Let pain be your guide: If it hurts to do something, don't do it.  Pain is your body warning you to avoid that activity for another week until the pain goes down. You may drive when you are no longer taking narcotic prescription pain medication, you can comfortably wear a seatbelt, and you can safely make sudden turns/stops to protect yourself without hesitating due to  pain. You may have sexual intercourse when it is comfortable. If it hurts to do something, stop.   MEDICATIONS Take your usually prescribed home medications unless otherwise directed.    Blood thinners:  You can restart any strong blood thinners after the second postoperative day  for example: COUMADIN (warfarin), XERELTO (rivaroxaban), ELIQUIS (apixaban), PLAVIX (clopidigrel), BRILINTA (ticagrelor), EFFIENT (prasugrel), PRADAXA (dabigatran), etc  Continue aspirin  before & after surgery..     Some oozing/bleeding the first 1-2 weeks is common but should taper down & be small volume.    If you are passing many large clots or having uncontrolling bleeding, call your surgeon    PAIN CONTROL Pain after surgery or related to activity is often due to strain/injury to muscle, tendon, nerves and/or incisions.  This pain is usually short-term and will improve in a few months.  To help speed the process of healing and to get back to regular activity more quickly, DO THE FOLLOWING THINGS TOGETHER: Increase activity gradually.  DO NOT PUSH THROUGH PAIN Use Ice and/or Heat Try Gentle Massage and/or Stretching Take over the counter pain medication Take Narcotic prescription pain medication for more severe pain  Good pain control = faster recovery.  It is better to take more medicine to be more active than to stay in bed all day to avoid medications.  Increase activity gradually Avoid heavy lifting at first, then increase to lifting as tolerated over the next 6 weeks. Do not "push through" the pain.  Listen to your body and avoid positions and maneuvers than reproduce the pain.  Wait a few days before trying something more intense Walking an hour a day is encouraged to help your body recover faster and more safely.  Start slowly and stop when getting sore.  If you can walk 30 minutes without stopping or pain, you can try more intense activity (running, jogging, aerobics, cycling, swimming,  treadmill, sex, sports, weightlifting, etc.) Remember: If it hurts to do it, then don't do it! Use Ice and/or Heat You will have swelling and bruising around the incisions.  This will take several weeks to resolve. Ice packs or heating pads (6-8 times a day, 30-60 minutes at a time) will help sooth soreness & bruising. Some people prefer to use ice alone, heat alone, or alternate between ice & heat.  Experiment and see what works best for you.  Consider trying ice for the first few days to help decrease swelling and bruising; then, switch to heat to help relax sore spots and speed recovery. Shower every day.  Short baths are fine.  It feels good!  Keep the incisions and wounds clean with soap & water.   Try Gentle Massage and/or Stretching Massage at the area of pain many times a day Stop if you feel pain - do not overdo it Take over the counter pain medication This helps the muscle and nerve tissues become less irritable and calm down faster Choose ONE of the following over-the-counter anti-inflammatory medications: Acetaminophen  500mg  tabs (Tylenol ) 1-2 pills with every meal  and just before bedtime (avoid if you have liver problems or if you have acetaminophen  in you narcotic prescription) Naproxen  220mg  tabs (ex. Aleve , Naprosyn ) 1-2 pills twice a day (avoid if you have kidney, stomach, IBD, or bleeding problems) Ibuprofen 200mg  tabs (ex. Advil, Motrin) 3-4 pills with every meal and just before bedtime (avoid if you have kidney, stomach, IBD, or bleeding problems) Take with food/snack several times a day as directed for at least 2 weeks to help keep pain / soreness down & more manageable. Take Narcotic prescription pain medication for more severe pain A prescription for strong pain control is often given to you upon discharge (for example: oxycodone /Percocet, hydrocodone /Norco/Vicodin, or tramadol /Ultram ) Take your pain medication as prescribed. Be mindful that most narcotic prescriptions  contain Tylenol  (acetaminophen ) as well - avoid taking too much Tylenol . If you are having problems/concerns with the prescription medicine (does not control pain, nausea, vomiting, rash, itching, etc.), please call us  [phone] to see if we need to switch you to a different pain medicine that will work better for you and/or control your side effects better. If you need a refill on your pain medication, you must call the office before 4 pm and on weekdays only.  By federal law, prescriptions for narcotics cannot be called into a pharmacy.  They must be filled out on paper & picked up from our office by the patient or authorized caretaker.  Prescriptions cannot be filled after 4 pm nor on weekends.    WHEN TO CALL US  [phone] Severe uncontrolled or worsening pain  Fever over 101 F (38.5 C) Concerns with the incision: Worsening pain, redness, rash/hives, swelling, bleeding, or drainage Reactions / problems with new medications (itching, rash, hives, nausea, etc.) Nausea and/or vomiting Difficulty urinating Difficulty breathing Worsening fatigue, dizziness, lightheadedness, blurred vision Other concerns If you are not getting better after two weeks or are noticing you are getting worse, contact our office [phone] for further advice.  We may need to adjust your medications, re-evaluate you in the office, send you to the emergency room, or see what other things we can do to help. The clinic staff is available to answer your questions during regular business hours (8:30am-5pm).  Please don't hesitate to call and ask to speak to one of our nurses for clinical concerns.    A surgeon from [facility] is always on call at the hospitals 24 hours/day If you have a medical emergency, go to the nearest emergency room or call 911.  FOLLOW UP in our office One the day of your discharge from the hospital (or the next business weekday), please call [facility] to set up  or confirm an appointment to see your surgeon in the office for a follow-up appointment.  Usually it is 2-3 weeks after your surgery.   If you have skin staples at your incision(s), let the office know so we can set up a time in the office for the nurse to remove them (usually around 10 days after surgery). Make sure that you call for appointments the day of discharge (or the next business weekday) from the hospital to ensure a convenient appointment time. IF YOU HAVE DISABILITY OR FAMILY LEAVE FORMS, BRING THEM TO THE OFFICE FOR PROCESSING.  DO NOT GIVE THEM TO YOUR DOCTOR.  [facility] [address], [address], [place], [place]  [address] ? [phone] - Main [phone] - Toll Free,  [phone] - Fax [email or web address]  GETTING TO GOOD BOWEL HEALTH. It is expected for your digestive tract to need a few months to get back to normal.  It is common for your bowel movements and stools to be irregular.  You will have occasional bloating and cramping that should eventually fade away.  Until you are eating solid food normally, off all pain medications, and back to regular activities; your bowels will not be normal.   Avoiding constipation The goal: ONE SOFT BOWEL MOVEMENT A DAY!    Drink plenty of fluids.  Choose water first. TAKE A FIBER SUPPLEMENT EVERY DAY THE REST OF YOUR LIFE During your first week back home, gradually add back a fiber supplement every day Experiment which form you can tolerate.   There are many forms such as powders, tablets, wafers, gummies, etc Psyllium bran (Metamucil), methylcellulose (Citrucel), Miralax  or Glycolax , Benefiber, Flax Seed.  Adjust the dose week-by-week (1/2 dose/day to 6 doses a day) until you are moving your bowels 1-2 times a day.  Cut back the dose or try a different fiber product if it is giving you problems such as diarrhea or bloating. Sometimes a laxative is needed to help jump-start bowels if  constipated until the fiber supplement can help regulate your bowels.  If you are tolerating eating & you are farting, it is okay to try a gentle laxative such as double dose MiraLax , prune juice, or Milk of Magnesia.  Avoid using laxatives too often. Stool softeners can sometimes help counteract the constipating effects of narcotic pain medicines.  It can also cause diarrhea, so avoid using for too long. If you are still constipated despite taking fiber daily, eating solids, and a few doses of laxatives, call our office. Controlling diarrhea Try drinking liquids and eating bland foods for a few days to avoid stressing your intestines further. Avoid dairy products (especially milk & ice cream) for a short time.  The intestines often can lose the ability to digest lactose when stressed. Avoid foods that cause gassiness or bloating.  Typical foods include beans and other legumes, cabbage, broccoli, and dairy foods.  Avoid greasy, spicy, fast foods.  Every person has some sensitivity to other foods, so listen to your body and avoid those foods that trigger problems for you. Probiotics (such as active yogurt, Align, etc) may help repopulate the intestines and colon with normal bacteria and calm down a sensitive digestive tract Adding a fiber supplement gradually can help thicken stools by absorbing excess fluid and retrain the intestines to act more normally.  Slowly increase the dose over a few weeks.  Too much fiber too soon can backfire and cause cramping & bloating. It is okay to try and slow down diarrhea with a few doses of antidiarrheal medicines.   Bismuth subsalicylate (ex. Kayopectate, Pepto Bismol) for a few doses can help control diarrhea.  Avoid if pregnant.   Loperamide  (Imodium ) can slow down diarrhea.  Start with one tablet (2mg ) first.  Avoid if you are having fevers or severe pain.  ILEOSTOMY PATIENTS WILL HAVE CHRONIC DIARRHEA since their colon is not in use.    Drink plenty of liquids.   You will need to drink even more glasses of water/liquid a day to avoid getting dehydrated. Record output from your ileostomy.  Expect to empty the bag every 3-4 hours at first.  Most people with a permanent ileostomy empty their bag 4-6 times at the least.   Use antidiarrheal medicine (especially Imodium ) several times a day to avoid getting dehydrated.  Start with a dose at bedtime & breakfast.  Adjust up or down as needed.  Increase antidiarrheal medications as directed to avoid emptying the bag more than 8 times a day (every 3 hours). Work with your wound ostomy nurse to learn care for your ostomy.  See ostomy care instructions. TROUBLESHOOTING IRREGULAR BOWELS 1) Start with a soft & bland diet. No spicy, greasy, or fried foods.  2) Avoid gluten/wheat or dairy products from diet to see if symptoms improve. 3) Miralax  17gm or flax seed mixed in 8oz. water or juice-daily. May use 2-4 times a day as needed. 4) Gas-X, Phazyme, etc. as needed for gas & bloating.  5) Prilosec  (omeprazole ) over-the-counter as needed 6)  Consider probiotics (Align, Activa, etc) to help calm the bowels down  Call your doctor if you are getting worse or not getting better.  Sometimes further testing (cultures, endoscopy, X-ray studies, CT scans, bloodwork, etc.) may be needed to help diagnose and treat the cause of the diarrhea. [facility] [address], [address], [place], [place]  [address] [phone] - Main.    [phone]  - Toll Free.   [phone] - Fax [email or web address]

## 2023-11-30 NOTE — Transfer of Care (Signed)
 Immediate Anesthesia Transfer of Care Note  Patient: [name]  Procedure(s) Performed: Procedure(s) with comments: COLECTOMY, SIGMOID, ROBOT-ASSISTED (N/A) - POSSIBLE VAGINAL REPAIR AND POSSIBLE OSTOMY SIGMOIDOSCOPY, FLEXIBLE (N/A) Cystoscopy with bilateral ureteral FireFly injections Transurethral resection of bladder tumor--large (N/A)  Patient Location: PACU  Anesthesia Type:General  Level of Consciousness:  sedated, patient cooperative and responds to stimulation  Airway & Oxygen Therapy:Patient Spontanous Breathing and Patient connected to face mask oxgen  Post-op Assessment:  Report given to PACU RN and Post -op Vital signs reviewed and stable  Post vital signs:  Reviewed and stable  Last Vitals:  Vitals:   11/30/23 1005  BP: (!) 149/92  Pulse: 81  Resp: 16  Temp: 36.7 C  SpO2: 99%    Complications: No apparent anesthesia complications Anesthesia Transfer of Care Note  Patient: [name]  Procedure(s) Performed: COLECTOMY, SIGMOID, ROBOT-ASSISTED SIGMOIDOSCOPY, FLEXIBLE Cystoscopy with bilateral ureteral FireFly injections Transurethral resection of bladder tumor--large  Patient Location: PACU  Anesthesia Type:General  Level of Consciousness: awake, alert , and oriented  Airway & Oxygen Therapy: Patient Spontanous Breathing and Patient connected to face mask oxygen  Post-op Assessment: Report given to RN  Post vital signs: Reviewed and stable  Last Vitals:  Vitals Value Taken Time  BP 157/78 11/30/23 1603  Temp    Pulse 62 11/30/23 1606  Resp 12 11/30/23 1606  SpO2 100 % 11/30/23 1606  Vitals shown include unfiled device data.  Last Pain:  Vitals:   11/30/23 1005  TempSrc: Oral         Complications: No notable events documented.

## 2023-11-30 NOTE — H&P (Signed)
 11/30/2023     REFERRING PHYSICIAN: Darlene Ehlers, MD  Patient Care Team: Dorena Gander, MD as PCP - General (Family Medicine) Darlene Ehlers, MD (Urogynecology) Ernie Heal, Jadene Maxwell, MD (Infectious Diseases) Dawley, Colby Daub, DO (Pain Medicine) Felecia Hopper, MD (Gastroenterology)  PROVIDER: Girtha Lama, MD  [facility] MRN: [id number] DOB: 25-Sep-1955  SUBJECTIVE   Chief Complaint: New Consultation   [name] is a 68 y.o. female  who is seen today as an office consultation  at the request of DrAron Lard  for evaluation of rectovaginal fistula  History of Present Illness:  Patient denies any history of diverticulitis or other colon issues but noted stool drainage in her vagina. Saw gynecology. Then saw saw urogynecology and was found to have feculent drainage in her vagina suspicious for rectovaginal fistula. Had CT with contrast that raises the suspicion as well. Patient comes today with her daughter who is pushing her wheelchair. She claims she uses a walker to get around at home but it is difficult on these doctor visits. She has been seeing a lot of them for spinal issues, infections, chronic pain, feculent drainage, etc. She gets bowel movements and has to wear diapers. Changes in many times a day. Frustrating and embarrassing. Her bowels have been kind of watery and loose which exacerbates things.  History of type 2 diabetes. Bipolar disorder. Cognitive impairments. Had right femur fracture with length discrepancy. Scoliosis was prior spinal surgery he was younger. She had a fall few years ago and then required spinal surgery that resulted in wound breakdown and breakdown and exposed hardware in 2024. Had complication of some pneumonia and reintubation and ventriculitis. IV antibiotics for 6 weeks. She has had challenging recovery in the past year with needing pain meds and antibiotics. Transitioning neurosurgical care to a surgeon at Dr. Nellie Banas at [facility].  She has pretty much been using a wheelchair to get around due to her severe back pain and limited mobility and prior hip surgeries for the past year when she used to be a moderately heavy smoker but she has been tapered off and tells me that she is on nicotine  patches and has not smoked for the past month  History of VSD. Hypertension. Nonobstructive coronary disease. Had an echocardiogram 4055% in 2024 with improvement in VT ablation. 45 to 50% August 2024. Looks like she will her last colonoscopy is Dr. Amye Baller in 2021. She had a Cologuard 2022 that has been negative. I do not think she has been scoped since. I think Dr. Veronda Goody now takes care of her since Dr. Dellis Fermo retired from a gastroenterology standpoint.   Medical History:  Past Medical History:  Diagnosis Date  Bipolar 2 disorder (CMS/HHS-HCC)  Diabetes (CMS/HHS-HCC)  type 2, controlled with diet and medication  H/O degenerative disc disease  Hypothyroid  Osteoporosis  Ventricular septal defect (HHS-HCC)   Patient Active Problem List  Diagnosis  Colovaginal fistula  Diverticulosis   Past Surgical History:  Procedure Laterality Date  Complete Spinal Fusion 1996  hardware removed in 1998  HYSTERECTOMY VAGINAL  L knee replacement Left  L shoulder replacement Left  REPLACEMENT TOTAL HIP W/ RESURFACING IMPLANTS Bilateral  x2    Allergies  Allergen Reactions  Gallium-67 Citrate Swelling  Pt states she can not have radioactive injections.  Nirmatrelvir-Ritonavir Other (See Comments)   Current Outpatient Medications on File Prior to Visit  Medication Sig Dispense Refill  acetaminophen  (TYLENOL ) 325 MG tablet Take 650 mg by mouth every 6 (six)  hours as needed for Pain  amoxicillin -clavulanate (AUGMENTIN ) 875-125 mg tablet Take 1 tablet by mouth 2 (two) times daily  buPROPion  (WELLBUTRIN  XL) 150 MG XL tablet Take 150 mg by mouth once daily  carvediloL  (COREG ) 25 MG tablet Take 25 mg by mouth 2 (two) times daily with meals   ceFEPIme  (MAXIPIME ) 2 gram injection  cyclobenzaprine  (FLEXERIL ) 10 MG tablet Take 10 mg by mouth 3 (three) times daily  DAPTOmycin (CUBICIN) injection  diclofenac  (VOLTAREN ) 1 % topical gel Apply 2 g topically 4 (four) times daily L shoulder  donepeziL  (ARICEPT ) 10 MG tablet Take 10 mg by mouth every morning  doxycycline  (VIBRA -TABS) 100 MG tablet Take 100 mg by mouth 2 (two) times daily  empagliflozin  (JARDIANCE ) 10 mg tablet Take 10 mg by mouth every morning Before breakfast  ergocalciferol , vitamin D2, (VITAMIN D2 ORAL) Take by mouth  famotidine  (PEPCID ) 20 MG tablet Take 20 mg by mouth 2 (two) times daily  fluvoxaMINE  (LUVOX ) 100 MG tablet Take 100 mg by mouth at bedtime  gabapentin  (NEURONTIN ) 100 MG capsule Take 100 mg by mouth 3 (three) times daily  lamoTRIgine  (LAMICTAL ) 200 MG tablet Take 200 mg by mouth once daily  levothyroxine  (SYNTHROID ) 88 MCG tablet Take 88 mcg by mouth every morning Take on an empty stomach with a glass of water at least 30-60 minutes before breakfast.  losartan  (COZAAR ) 25 MG tablet Take 25 mg by mouth at bedtime  melatonin 10 mg Tab Take 5 mg by mouth at bedtime as needed (as needed for sleep)  metFORMIN  (GLUCOPHAGE ) 500 MG tablet Take 500 mg by mouth 2 (two) times daily with meals  multivitamin tablet Take 1 tablet by mouth once daily  ondansetron  (ZOFRAN ) 4 MG tablet Take 4 mg by mouth every 8 (eight) hours as needed for Nausea  potassium chloride  (KLOR-CON ) 10 MEQ ER tablet Take 30 mEq by mouth 2 (two) times daily  rosuvastatin  (CRESTOR ) 20 MG tablet Take 20 mg by mouth once daily  sodium chloride  1,000 mg soluble tablet Take 1 g by mouth 2 (two) times daily With a meal  traZODone  (DESYREL ) 100 MG tablet Take 100 mg by mouth at bedtime  buprenorphine  (BUTRANS ) 10 mcg/hour Place onto the skin (Patient not taking: Reported on 10/24/2023)  calcium  carbonate (CALCIUM  600 ORAL) Take 600 mg by mouth 2 (two) times daily (Patient not taking: Reported on 10/24/2023)   cefdinir  (OMNICEF ) 300 mg capsule Take 300 mg by mouth 2 (two) times daily (Patient not taking: Reported on 10/24/2023)  diphenoxylate -atropine  (LOMOTIL ) 2.5-0.025 mg tablet Take 1 tablet by mouth 2 (two) times daily as needed for Diarrhea (Patient not taking: Reported on 10/24/2023)  HYDROcodone -acetaminophen  (NORCO) 5-325 mg tablet Take 1 tablet by mouth (Patient not taking: Reported on 10/24/2023)  lidocaine  (LIDODERM ) 5 % patch Place 2 patches onto the skin daily Apply patch to the most painful area for up to 12 hours in a 24 hour period.  methocarbamoL  (ROBAXIN ) 750 MG tablet Take 750 mg by mouth (Patient not taking: Reported on 10/24/2023)   No current facility-administered medications on file prior to visit.   Family History  Problem Relation Age of Onset  Bipolar disorder Father    Social History   Tobacco Use  Smoking Status Every Day  Types: Cigarettes  Smokeless Tobacco Not on file  Tobacco Comments  6-8 cigarettes    Social History   Socioeconomic History  Marital status: Married  Tobacco Use  Smoking status: Every Day  Types: Cigarettes  Tobacco comments:  6-8 cigarettes  Substance and Sexual Activity  Alcohol use: Not Currently  Drug use: Not Currently   Social Drivers of Health   Financial Resource Strain: Low Risk (07/08/2023)  Overall Financial Resource Strain (CARDIA)  Difficulty of Paying Living Expenses: Not hard at all  Food Insecurity: No Food Insecurity (07/08/2023)  Hunger Vital Sign  Worried About Running Out of Food in the Last Year: Never true  Ran Out of Food in the Last Year: Never true  Transportation Needs: No Transportation Needs (07/08/2023)  PRAPARE - Risk analyst (Medical): No  Lack of Transportation (Non-Medical): No  Housing Stability: Low Risk (10/24/2023)  Housing Stability Vital Sign  Unable to Pay for Housing in the Last Year: No  Number of Times Moved in the Last Year: 0  Homeless in the Last Year: No    ############################################################  Review of Systems: A complete review of systems (ROS) was obtained from the patient.  We have reviewed this information and discussed as appropriate with the patient.  See HPI as well for other pertinent ROS.  Constitutional: No fevers, chills, sweats. Weight stable Eyes: No vision changes, No discharge HENT: No sore throats, nasal drainage Lymph: No neck swelling, No bruising easily Pulmonary: No cough, productive sputum CV: No orthopnea, PND . No exertional chest/neck/shoulder/arm pain. Patient cannot walk .   GI: No personal nor family history of GI/colon cancer, inflammatory bowel disease, irritable bowel syndrome, allergy such as Celiac Sprue, dietary/dairy problems, colitis, ulcers nor gastritis. No recent sick contacts/gastroenteritis. No travel outside the country. No changes in diet.  Renal: No UTIs, No hematuria Genital: No drainage, bleeding, masses Musculoskeletal: No severe joint pain. Good ROM major joints Skin: No sores or lesions Heme/Lymph: No easy bleeding. No swollen lymph nodes Neuro: No active seizures. No facial droop Psych: No hallucinations. No agitation  OBJECTIVE   Vitals:  10/24/23 1436 10/24/23 1437  BP: (!) 148/79  Pulse: 60  Temp: 36.7 C (98 F)  SpO2: 99%  Weight: 63 kg (138 lb 12.8 oz)  Height: 152.4 cm (5')  PainSc: 0-No pain   Body mass index is 27.11 kg/m.  PHYSICAL EXAM:  Constitutional: Not cachectic. Hygeine adequate. Vitals signs as above.  Eyes: No glasses. Vision adequate,Pupils reactive, normal extraocular movements. Sclera nonicteric Neuro: CN II-XII intact. No major focal sensory defects. No major motor deficits. Lymph: No head/neck/groin lymphadenopathy Psych: No severe agitation. No severe anxiety. Judgment & insight Adequate, Oriented x4, HENT: Normocephalic, Mucus membranes moist. No thrush. Hearing: adequate Neck: Supple, No tracheal deviation. No obvious  thyromegaly Chest: No pain to chest wall compression. Good respiratory excursion. No audible wheezing CV: Pulses intact. regular. No major extremity edema Ext: No obvious deformity or contracture. Edema: Not present. No cyanosis Skin: No major subcutaneous nodules. Warm and dry Musculoskeletal: Severe joint rigidity not present. No obvious clubbing. No digital petechiae. Mobility: no assist device moving easily without restrictions  Abdomen: Flat Soft. Nondistended. Nontender. Hernia: Not present. Diastasis recti: Not present. No hepatomegaly. No splenomegaly.  Genital/Pelvic: Inguinal hernia: Not present. Inguinal lymph nodes: without lymphadenopathy nor hidradenitis.  No obvious feculent drainage today but there is redness at the introitus. To digital exam I can feel a punctate opening in the vaginal cuff left anterior which is rather classic for colovaginal fistula location. Seems smooth  Rectal:  Perianal skin Clean with good hygiene  Pruritis ani: Not present Pilonidal disease: Not present  External hemorrhoids: Small tag prolapsing out Not present Condyloma / warts: Not present  Anal fissure: Not present Perirectal abscess/fistula: Not present Sphincter tone: Normal   Digital and anoscopic rectal exam: Tolerated  Hemorrhoidal piles: Grade 1-2 internal hemorrhoids. Small anal crypt smooth polyp with mild prolapse but nothing too severe Prostate: N/A Rectovaginal septum: Thin with no rectocele Rectal masses: Not present  Other significant findings: N/A  Patient examined with assistance of female Medical Assistant in the room with patient in decubitus position .  ###################################    ###################################################################  Labs, Imaging and Diagnostic Testing:  Located in 'Care Everywhere' section of Epic EMR chart  PRIOR [facility] CLINIC NOTES:  Not applicable  SURGERY NOTES:  Not applicable  PATHOLOGY:  Not  applicable  Assessment and Plan:  DIAGNOSES:  Diagnoses and all orders for this visit:  Colovaginal fistula  Diverticulosis  Other orders - neomycin 500 mg tablet; Take 2 tablets (1,000 mg total) by mouth 3 (three) times daily for 3 doses SEE BOWEL PREP INSTRUCTIONS: Take 2 tablets at 2pm, 3pm, and 10pm the day prior to your colon operation. - metroNIDAZOLE  (FLAGYL ) 500 MG tablet; Take 2 tablets (1,000 mg total) by mouth 3 (three) times daily for 3 doses SEE BOWEL PREP INSTRUCTIONS: Take 2 tablets at 2pm, 3pm, and 10pm the day prior to your colon operation. - polyethylene glycol (MIRALAX ) powder; Take 233.75 g by mouth once for 1 dose Take according to your procedure prep instructions. - bisacodyL  (DULCOLAX) 5 mg EC tablet; Take 4 tablets (20 mg total) by mouth once daily as needed for Constipation for up to 1 dose - ondansetron  (ZOFRAN ) 4 MG tablet; Take 1 tablet (4 mg total) by mouth every 8 (eight) hours as needed for Nausea    ASSESSMENT/PLAN  Rather classic story and exam and CT scan for colovaginal fistula. She does not have a mid or distal rectovaginal fistula. There is no rectal mass or tumor that could be explaining this. Nothing on exam nor CT scan over 90% of the time this is due to diverticular disease which would mean she has a sigmoid colon etiology in the rectum is spared.  I think she would benefit from robotic sigmoid colectomy and suture repair of fistula and vaginal cuff.  The anatomy & physiology of the digestive tract was discussed. The pathophysiology of colorectal fistula to the vagina was discussed. Natural history risks without surgery was discussed. I worked to give an overview of the disease and the frequent need to have multispecialty involvement.   I feel the risks of no intervention will lead to serious problems that outweigh the operative risks; therefore, I recommended surgery to treat the pathology. Minimally Invasive (robotic/laparoscopic) & open  techniques for probable colectomy were discussed. Repair of the vaginal opening with possible need for intervening material to prevent recurrence such as omentum, muscle, mesh, etc was discussed. Possible fecal diversion by ostomy was discussed. We will work to preserve anal & pelvic floor function without sacrificing cure.  Risks such as bleeding, infection, abscess, leak, injury to other organs, need for repair of tissues / organs, recurrence with reoperation, possible ostomy, hernia, heart attack, death, and other risks were discussed. I noted a good likelihood this will help address the problem. Goals of post-operative recovery were discussed as well. We will work to minimize complications. Educational information was given as well. Questions were answered.   The patient and her daughter expressed understanding and wished to proceed with surgery. Patient nervous about it and frustrated she will need an operation; but she understands the importance of it.  Cleared by cardiology  Colonoscopy underwhelming 5/13 yesterday per Dr Dellis Fermo   Eddye Goodie, MD, FACS, MASCRS Esophageal, Gastrointestinal & Colorectal Surgery Robotic and Minimally Invasive Surgery  [facility] [address], [address] [place], [place] [address] [phone] Fax [phone] Main  CONTACT INFORMATION: Weekday (9AM-5PM): Call [facility] main office at [phone] Weeknight (5PM-9AM) or Weekend/Holiday: Check EPIC "Web Links" tab & use "AMION" (password " [email or web address]") for General Surgery [facility] coverage  Please, DO NOT use SecureChat  (it is not reliable communication to reach operating surgeons & will lead to a delay in care).   Epic staff messaging available for outptient concerns needing 1-2 business day response.     11/30/2023

## 2023-11-30 NOTE — Interval H&P Note (Signed)
 History and Physical Interval Note:  11/30/2023 10:20 AM  [name]  has presented today for surgery, with the diagnosis of COLOVAGINAL FISTULA.  The various methods of treatment have been discussed with the patient and family. After consideration of risks, benefits and other options for treatment, the patient has consented to  Procedure(s) with comments: COLECTOMY, SIGMOID, ROBOT-ASSISTED (N/A) - POSSIBLE VAGINAL REPAIR AND POSSIBLE OSTOMY SIGMOIDOSCOPY, FLEXIBLE (N/A) CYSTOSCOPY WITH INDOCYANINE GREEN IMAGING (ICG) (N/A) as a surgical intervention.  The patient's history has been reviewed, patient examined, no change in status, stable for surgery.  I have reviewed the patient's chart and labs.  Questions were answered to the patient's satisfaction.    I have re-reviewed the the patient's records, history, medications, and allergies.  I have re-examined the patient.  I again discussed intraoperative plans and goals of post-operative recovery.  The patient agrees to proceed.  [name]  August 30, 1955 [id number]  Patient Care Team: Dorena Gander, MD as PCP - General (Family Medicine) Hugh Madura, MD as PCP - Cardiology (Cardiology) Candyce Champagne, MD as Consulting Physician (General Surgery) Darlene Ehlers, MD as Consulting Physician (Gynecology) Felecia Hopper, MD as Consulting Physician (Gastroenterology) Ernie Heal, Jerelyn Money, MD as Consulting Physician (Infectious Diseases)  Patient Active Problem List   Diagnosis Date Noted   Diskitis 10/04/2023   Rectovaginal fistula 09/06/2023   Hypotension 09/06/2023   Fracture of tenth thoracic vertebra (HCC) 09/05/2023   History of discitis 09/05/2023   Senile osteoporosis 09/05/2023   History of urinary retention 09/05/2023   Vaginal atrophy 09/05/2023   Nocturia 09/05/2023   Vulvar ulcer 09/05/2023   Incontinence of feces 09/05/2023   Pulmonary nodule 08/02/2023   Non-traumatic compression fracture of vertebral column  (HCC) 06/24/2023   Nonobstructive atherosclerosis of coronary artery 02/10/2023   Bilateral pseudophakia 02/03/2023   Vertebral fracture, osteoporotic (HCC) 01/06/2023   Ventriculitis of brain due to bacteria 11/02/2022   Anemia of chronic illness 10/12/2022   Bipolar disorder, in partial remission, most recent episode depressed (HCC) 10/07/2022   Adjustment disorder with mixed anxiety and depressed mood 09/30/2022   Chronic wound 09/25/2022   Hyperglycemia 09/25/2022   Chronic midline thoracic back pain 09/17/2022   Primary insomnia 09/17/2022   Chronic combined systolic and diastolic CHF (congestive heart failure) (HCC) 09/14/2022   Acute blood loss anemia 09/09/2022   Infection of lumbar spine (HCC) 09/03/2022   Malnutrition of moderate degree 08/20/2022   Hardware complicating wound infection (HCC) 08/20/2022   Infection of deep incisional surgical site after procedure 08/20/2022   Aspiration pneumonia of both lungs (HCC) 08/19/2022   Acute respiratory failure with hypoxia (HCC) 08/19/2022   Sepsis (HCC) 08/19/2022   Seizure (HCC) 08/19/2022   Wound dehiscence 08/09/2022   Delayed surgical wound healing 07/08/2022   Fracture of lumbar spine without cord injury (HCC) 06/26/2022   Lumbar vertebral fracture (HCC) 06/25/2022   Mood disorder (HCC) 09/29/2021   Generalized muscle weakness 04/30/2019   Mild cognitive impairment 09/06/2018   Hyponatremia 02/28/2018   S/P shoulder replacement, left 08/19/2016   Anxiety 10/17/2014   Acid reflux 10/17/2014   BP (high blood pressure) 10/17/2014   Arthritis, degenerative 10/17/2014   Adult hypothyroidism 10/17/2014   UTI (urinary tract infection) 06/03/2014   Fracture of bone adjacent to prosthesis 06/03/2014   Diabetes (HCC) 06/02/2014   Peri-prosthetic fracture of femur following total hip arthroplasty 06/02/2014   Urinary retention 05/13/2014   Chronic pain 05/10/2014   History of hip surgery 05/10/2014  Osteoarthritis  09/30/2011   UNSPECIFIED HEART FAILURE 06/24/2010   UNSPECIFIED CONGENITAL DEFECT OF SEPTAL CLOSURE 06/24/2010   Secondary cardiomyopathy (HCC) 03/18/2010   Tobacco use 03/18/2010   DM 06/13/2009   HYPERTENSION, UNSPECIFIED 06/13/2009   VENTRICULAR TACHYCARDIA 06/13/2009   VENTRICULAR SEPTAL DEFECT, CONGENITAL 06/13/2009   Diabetes mellitus, type 2 (HCC) 06/13/2009   Essential (primary) hypertension 06/13/2009   Type II diabetes mellitus (HCC) 06/13/2009    Past Medical History:  Diagnosis Date   Anemia    Anxiety    Arthritis    Bipolar disorder (HCC)    Cardiomyopathy (HCC)    Complication of anesthesia    Awake but could not move   Depression    Diskitis 10/04/2023   GERD (gastroesophageal reflux disease)    Heart failure (HCC)    Acute   Heart murmur    "related to VSD"   High cholesterol    History of blood transfusion    "related to OR" (08/19/2016)   History of hiatal hernia    Hypertension    Hyperthyroidism    Hypotension 09/06/2023   Mild cognitive impairment 09/06/2018   Paroxysmal ventricular tachycardia (HCC)    Pneumonia    Rectovaginal fistula 09/06/2023   Tachycardia, unspecified    Type II diabetes mellitus (HCC)    UTI (urinary tract infection)    being treated with Keflex    Ventricular septal defect    Ventriculitis of brain due to bacteria 11/02/2022   Vertebral fracture, osteoporotic (HCC) 01/06/2023    Past Surgical History:  Procedure Laterality Date   ABDOMINAL HYSTERECTOMY     BACK SURGERY     CARDIAC CATHETERIZATION N/A 06/23/2015   Procedure: Left Heart Cath and Coronary Angiography;  Surgeon: Darlis Eisenmenger, MD;  Location: [facility] INVASIVE CV LAB;  Service: Cardiovascular;  Laterality: N/A;   CARDIAC CATHETERIZATION  1960   "VSD was so small; didn't need repaired"   EXAM UNDER ANESTHESIA WITH MANIPULATION OF HIP Right 06/02/2014   dr Ana Balling   FRACTURE SURGERY     HERNIA REPAIR     HIP CLOSED REDUCTION Right 06/02/2014   Procedure:  CLOSED MANIPULATION HIP;  Surgeon: Alphonzo Ask, MD;  Location: [facility] OR;  Service: Orthopedics;  Laterality: Right;   IR FLUORO GUIDED NEEDLE PLC ASPIRATION/INJECTION LOC  08/09/2023   JOINT REPLACEMENT     JOINT REPLACEMENT     POSTERIOR LUMBAR FUSION 4 LEVEL N/A 06/26/2022   Procedure: Lumbar One To Lumbar Five Posterior Instrumented Fusion;  Surgeon: Cannon Champion, MD;  Location: [facility] OR;  Service: Neurosurgery;  Laterality: N/A;   REFRACTIVE SURGERY Bilateral    SHOULDER ARTHROSCOPY Right    SHOULDER OPEN ROTATOR CUFF REPAIR Right    SPINAL FUSION  1996   "t10 down to my coccyx   SPINE HARDWARE REMOVAL     TOTAL ABDOMINAL HYSTERECTOMY     TOTAL HIP ARTHROPLASTY Right 05/10/2014   [place]      by dr Veryl Gottron olcott   TOTAL KNEE ARTHROPLASTY Left    TOTAL SHOULDER ARTHROPLASTY Left 08/19/2016   Procedure: TOTAL SHOULDER ARTHROPLASTY;  Surgeon: Sammye Cristal, MD;  Location: [facility] OR;  Service: Orthopedics;  Laterality: Left;  Left total shoulder replacement    Social History   Socioeconomic History   Marital status: Married    Spouse name: Not on file   Number of children: Not on file   Years of education: Not on file   Highest education level: Master's degree (e.g., MA, MS, MEng,  MEd, MSW, MBA)  Occupational History   Occupation: Magazine features editor: SELF-EMPLOYED    Comment: former  Tobacco Use   Smoking status: Former    Types: Cigarettes    Passive exposure: Never   Smokeless tobacco: Never   Tobacco comments:    03/30/21 smokes 2 cigs daily.      08/06/22 Camilo Cella stated that the patient is not smoking while at [facility] for rehab. K[facility]    11-23-23 has been quit for 3 weeks  Vaping Use   Vaping status: Never Used  Substance and Sexual Activity   Alcohol use: No   Drug use: No   Sexual activity: Not Currently    Partners: Male    Birth control/protection: Surgical    Comment: Hysterectomy  Other Topics Concern   Not on file  Social History Narrative    Right handed   Caffeine  2-3 cups daily    Lives at home with husband    Social Drivers of Health   Financial Resource Strain: Low Risk  (07/08/2023)   Received from [facility]   Overall Financial Resource Strain (CARDIA)    Difficulty of Paying Living Expenses: Not hard at all  Food Insecurity: No Food Insecurity (07/08/2023)   Received from [facility]   Hunger Vital Sign    Worried About Running Out of Food in the Last Year: Never true    Ran Out of Food in the Last Year: Never true  Transportation Needs: No Transportation Needs (07/08/2023)   Received from [facility] - Transportation    In the past 12 months, has lack of transportation kept you from medical appointments or from getting medications?: No    Lack of Transportation (Non-Medical): No  Physical Activity: Not on file  Stress: Not on file  Social Connections: Not on file  Intimate Partner Violence: Not At Risk (06/27/2022)   Humiliation, Afraid, Rape, and Kick questionnaire    Fear of Current or Ex-Partner: No    Emotionally Abused: No    Physically Abused: No    Sexually Abused: No    Family History  Problem Relation Age of Onset   Other Mother        alive   Stroke Father 81       deceased   Dementia Father    Chorea Maternal Grandfather    Dementia Maternal Aunt    Dementia Maternal Aunt    Heart attack Other        multiple uncles have died with myocardial infarction   Bladder Cancer Neg Hx    Uterine cancer Neg Hx     Medications Prior to Admission  Medication Sig Dispense Refill Last Dose/Taking   acetaminophen  (TYLENOL ) 325 MG tablet Take 1-2 tablets (325-650 mg total) by mouth every 6 (six) hours as needed for mild pain or headache.   11/29/2023 at  6:00 PM   amoxicillin -clavulanate (AUGMENTIN ) 875-125 MG tablet Take 1 tablet by mouth 2 (two) times daily. 60 tablet 11 11/29/2023 at  6:00 PM   ascorbic acid (VITAMIN C) 500 MG  tablet Take 500 mg by mouth daily.   11/27/2023   ASPIRIN  81 PO Take 81 mg by mouth daily.   11/27/2023   buPROPion  (WELLBUTRIN  XL) 150 MG 24 hr tablet Take 1 tablet (150 mg total) by mouth daily. 30 tablet 0 11/30/2023 Morning   busPIRone (BUSPAR) 7.5 MG tablet Take 7.5 mg by mouth 2 (two)  times daily.   11/30/2023 at  8:30 AM   Calcium  Carb-Cholecalciferol (CALCIUM  600+D3) 600-20 MG-MCG TABS Take 1 tablet by mouth in the morning and at bedtime.   11/23/2023   carvedilol  (COREG ) 25 MG tablet Take 1 tablet (25 mg total) by mouth 2 (two) times daily with a meal. 60 tablet 0 11/30/2023 at  8:30 AM   Cholecalciferol (VITAMIN D3) 50 MCG (2000 UT) capsule Take 2,000 Units by mouth daily.   11/23/2023   cyclobenzaprine  (FLEXERIL ) 10 MG tablet Take 10 mg by mouth 3 (three) times daily.   11/30/2023 Morning   diphenoxylate -atropine  (LOMOTIL ) 2.5-0.025 MG tablet Take 1 tablet by mouth 2 (two) times daily as needed for diarrhea or loose stools. 30 tablet 0 Past Week   donepezil  (ARICEPT ) 10 MG tablet Take 10 mg by mouth every morning.   11/29/2023 Morning   doxycycline  (VIBRA -TABS) 100 MG tablet Take 1 tablet (100 mg total) by mouth 2 (two) times daily. 60 tablet 11 11/30/2023 Morning   empagliflozin  (JARDIANCE ) 10 MG TABS tablet Take 1 tablet (10 mg total) by mouth daily before breakfast. 90 tablet 3 11/23/2023   estradiol  (ESTRACE ) 0.1 MG/GM vaginal cream Place 0.5g nightly for two weeks then twice a week after 30 g 3 Past Week   famotidine  (PEPCID ) 20 MG tablet Take 1 tablet (20 mg total) by mouth 2 (two) times daily. (Patient taking differently: Take 20 mg by mouth every evening.) 60 tablet 0 Taking Differently   fenoprofen (NALFON) 600 MG TABS tablet Take 600 mg by mouth in the morning and at bedtime.   Past Month   fluvoxaMINE  (LUVOX ) 100 MG tablet Take 1 tablet (100 mg total) by mouth at bedtime. 30 tablet 0 11/29/2023 Bedtime   gabapentin  (NEURONTIN ) 100 MG capsule Take 1 capsule (100 mg total) by mouth 3 (three)  times daily. 90 capsule 0 11/29/2023 Bedtime   Lactobacillus (ACIDOPHILUS) 100 MG CAPS Take 1 capsule (100 mg total) by mouth 2 (two) times daily. (Patient taking differently: Take 250 mg by mouth daily.) 60 capsule 0 11/29/2023 Evening   lamoTRIgine  (LAMICTAL ) 100 MG tablet Take 200 mg by mouth at bedtime.   11/29/2023 Bedtime   levothyroxine  (SYNTHROID ) 88 MCG tablet Take 1 tablet (88 mcg total) by mouth daily before breakfast. 30 tablet 0 11/30/2023 Morning   losartan  (COZAAR ) 25 MG tablet Take 0.5 tablets (12.5 mg total) by mouth daily.   11/29/2023 Bedtime   Melatonin 10 MG TABS Take 10 mg by mouth at bedtime.   11/23/2023   metFORMIN  (GLUCOPHAGE ) 500 MG tablet Take 1 tablet (500 mg total) by mouth 2 (two) times daily with a meal. 60 tablet 0 11/23/2023   Multiple Vitamin (MULTIVITAMIN) tablet Take 1 tablet by mouth daily.     11/27/2023   Multiple Vitamins-Minerals (ZINC PO) Take 50 mg by mouth daily.   11/27/2023   nicotine  (NICODERM CQ  - DOSED IN MG/24 HOURS) 21 mg/24hr patch Place 21 mg onto the skin daily.   11/29/2023   nicotine  (NICODERM CQ  - DOSED IN MG/24 HR) 7 mg/24hr patch Place 1 patch (7 mg total) onto the skin daily. 28 patch 0 11/29/2023   nicotine  polacrilex (NICORETTE ) 4 MG gum Take 4 mg by mouth as needed for smoking cessation.   11/30/2023 at  9:30 AM   ondansetron  (ZOFRAN ) 4 MG tablet Take 1 tablet (4 mg total) by mouth every 8 (eight) hours as needed for refractory nausea / vomiting. 20 tablet 0 Past Month   pantoprazole  (PROTONIX )  40 MG tablet Take 40 mg by mouth every morning.   11/29/2023 Evening   potassium chloride  (KLOR-CON ) 10 MEQ tablet Take 20 mEq by mouth 3 (three) times daily.   11/23/2023   rosuvastatin  (CRESTOR ) 20 MG tablet Take 20 mg by mouth daily.   11/30/2023 at  8:30 AM   sodium chloride  1 g tablet Take 1 tablet (1 g total) by mouth 2 (two) times daily with a meal. 60 tablet 0 Past Week   traZODone  (DESYREL ) 100 MG tablet Take 1 tablet (100 mg total) by mouth at bedtime.  30 tablet 0 11/29/2023 Bedtime   VRAYLAR 1.5 MG capsule Take 1.5 mg by mouth at bedtime.   11/29/2023 Bedtime   denosumab (PROLIA) 60 MG/ML SOSY injection Inject 60 mg into the skin every 6 (six) months.   More than a month   melatonin 5 MG TABS Take 1 tablet (5 mg total) by mouth at bedtime as needed. (Patient not taking: Reported on 11/18/2023) 30 tablet 0 Not Taking    Current Facility-Administered Medications  Medication Dose Route Frequency Provider Last Rate Last Admin   acetaminophen  (TYLENOL ) tablet 1,000 mg  1,000 mg Oral On Call to OR Candyce Champagne, MD       alvimopan (ENTEREG) capsule 12 mg  12 mg Oral On Call to OR Candyce Champagne, MD       bupivacaine  liposome (EXPAREL ) 1.3 % injection 266 mg  20 mL Infiltration Once Candyce Champagne, MD       cefoTEtan (CEFOTAN) 2 g in sodium chloride  0.9 % 100 mL IVPB  2 g Intravenous On Call to OR Candyce Champagne, MD       chlorhexidine  (PERIDEX ) 0.12 % solution 15 mL  15 mL Mouth/Throat Once Ellender, Tommie Frame, MD       Or   Oral care mouth rinse  15 mL Mouth Rinse Once Ellender, Tommie Frame, MD       enoxaparin  (LOVENOX ) injection 40 mg  40 mg Subcutaneous Once Candyce Champagne, MD       gabapentin  (NEURONTIN ) capsule 200 mg  200 mg Oral On Call to OR Candyce Champagne, MD         Allergies  Allergen Reactions   Gallium (Radioactive Isotope) Swelling    Pt states she can not have radioactive injections.    Paxlovid [Nirmatrelvir-Ritonavir]     BP (!) 149/92   Pulse 81   Temp 98 F (36.7 C) (Oral)   Resp 16   Ht 5' (1.524 m)   Wt 60.3 kg   SpO2 99%   BMI 25.97 kg/m   Labs: Results for orders placed or performed during the hospital encounter of 11/30/23 (from the past 48 hours)  Glucose, capillary     Status: Abnormal   Collection Time: 11/30/23 10:01 AM  Result Value Ref Range   Glucose-Capillary 109 (H) 70 - 99 mg/dL    Comment: Glucose reference range applies only to samples taken after fasting for at least 8 hours.   Comment 1 Notify RN      Imaging / Studies: No results found.   Eddye Goodie, M.D., F.A.C.S. Gastrointestinal and Minimally Invasive Surgery [facility] [address] [place], [place] [address] [phone] Main / Paging  11/30/2023 10:20 AM    Eddye Goodie

## 2023-11-30 NOTE — H&P (Signed)
 H&P Physician requesting consult: [name]  Chief Complaint: Colovaginal fistula  History of Present Illness: 68 year old female with colovaginal fistula presents for robotic colectomy and fistula takedown.  Intraoperative ureteral firefly instillation requested.  Past Medical History:  Diagnosis Date   Anemia    Anxiety    Arthritis    Bipolar disorder (HCC)    Cardiomyopathy (HCC)    Complication of anesthesia    Awake but could not move   Depression    Diskitis 10/04/2023   GERD (gastroesophageal reflux disease)    Heart failure (HCC)    Acute   Heart murmur    "related to VSD"   High cholesterol    History of blood transfusion    "related to OR" (08/19/2016)   History of hiatal hernia    Hypertension    Hyperthyroidism    Hypotension 09/06/2023   Mild cognitive impairment 09/06/2018   Paroxysmal ventricular tachycardia (HCC)    Pneumonia    Rectovaginal fistula 09/06/2023   Tachycardia, unspecified    Type II diabetes mellitus (HCC)    UTI (urinary tract infection)    being treated with Keflex    Ventricular septal defect    Ventriculitis of brain due to bacteria 11/02/2022   Vertebral fracture, osteoporotic (HCC) 01/06/2023   Past Surgical History:  Procedure Laterality Date   ABDOMINAL HYSTERECTOMY     BACK SURGERY     CARDIAC CATHETERIZATION N/A 06/23/2015   Procedure: Left Heart Cath and Coronary Angiography;  Surgeon: Darlis Eisenmenger, MD;  Location: [facility] INVASIVE CV LAB;  Service: Cardiovascular;  Laterality: N/A;   CARDIAC CATHETERIZATION  1960   "VSD was so small; didn't need repaired"   EXAM UNDER ANESTHESIA WITH MANIPULATION OF HIP Right 06/02/2014   dr Ana Balling   FRACTURE SURGERY     HERNIA REPAIR     HIP CLOSED REDUCTION Right 06/02/2014   Procedure: CLOSED MANIPULATION HIP;  Surgeon: Alphonzo Ask, MD;  Location: [facility] OR;  Service: Orthopedics;  Laterality: Right;   IR FLUORO GUIDED NEEDLE PLC ASPIRATION/INJECTION LOC  08/09/2023   JOINT  REPLACEMENT     JOINT REPLACEMENT     POSTERIOR LUMBAR FUSION 4 LEVEL N/A 06/26/2022   Procedure: Lumbar One To Lumbar Five Posterior Instrumented Fusion;  Surgeon: Cannon Champion, MD;  Location: [facility] OR;  Service: Neurosurgery;  Laterality: N/A;   REFRACTIVE SURGERY Bilateral    SHOULDER ARTHROSCOPY Right    SHOULDER OPEN ROTATOR CUFF REPAIR Right    SPINAL FUSION  1996   "t10 down to my coccyx   SPINE HARDWARE REMOVAL     TOTAL ABDOMINAL HYSTERECTOMY     TOTAL HIP ARTHROPLASTY Right 05/10/2014   [place]      by dr Veryl Gottron olcott   TOTAL KNEE ARTHROPLASTY Left    TOTAL SHOULDER ARTHROPLASTY Left 08/19/2016   Procedure: TOTAL SHOULDER ARTHROPLASTY;  Surgeon: Sammye Cristal, MD;  Location: [facility] OR;  Service: Orthopedics;  Laterality: Left;  Left total shoulder replacement    Home Medications:  Medications Prior to Admission  Medication Sig Dispense Refill Last Dose/Taking   acetaminophen  (TYLENOL ) 325 MG tablet Take 1-2 tablets (325-650 mg total) by mouth every 6 (six) hours as needed for mild pain or headache.   11/29/2023 at  6:00 PM   ascorbic acid (VITAMIN C) 500 MG tablet Take 500 mg by mouth daily.   11/27/2023   ASPIRIN  81 PO Take 81 mg by mouth daily.   11/27/2023   buPROPion  (WELLBUTRIN  XL) 150 MG 24  hr tablet Take 1 tablet (150 mg total) by mouth daily. 30 tablet 0 11/30/2023 Morning   busPIRone (BUSPAR) 7.5 MG tablet Take 7.5 mg by mouth 2 (two) times daily.   11/30/2023 at  8:30 AM   Calcium  Carb-Cholecalciferol (CALCIUM  600+D3) 600-20 MG-MCG TABS Take 1 tablet by mouth in the morning and at bedtime.   11/23/2023   carvedilol  (COREG ) 25 MG tablet Take 1 tablet (25 mg total) by mouth 2 (two) times daily with a meal. 60 tablet 0 11/30/2023 at  8:30 AM   Cholecalciferol (VITAMIN D3) 50 MCG (2000 UT) capsule Take 2,000 Units by mouth daily.   11/23/2023   cyclobenzaprine  (FLEXERIL ) 10 MG tablet Take 10 mg by mouth 3 (three) times daily.   11/30/2023 Morning    diphenoxylate -atropine  (LOMOTIL ) 2.5-0.025 MG tablet Take 1 tablet by mouth 2 (two) times daily as needed for diarrhea or loose stools. 30 tablet 0 Past Week   donepezil  (ARICEPT ) 10 MG tablet Take 10 mg by mouth every morning.   11/29/2023 Morning   doxycycline  (VIBRA -TABS) 100 MG tablet Take 1 tablet (100 mg total) by mouth 2 (two) times daily. 60 tablet 11 11/30/2023 Morning   empagliflozin  (JARDIANCE ) 10 MG TABS tablet Take 1 tablet (10 mg total) by mouth daily before breakfast. 90 tablet 3 11/23/2023   estradiol  (ESTRACE ) 0.1 MG/GM vaginal cream Place 0.5g nightly for two weeks then twice a week after 30 g 3 Past Week   famotidine  (PEPCID ) 20 MG tablet Take 1 tablet (20 mg total) by mouth 2 (two) times daily. (Patient taking differently: Take 20 mg by mouth every evening.) 60 tablet 0 Taking Differently   fenoprofen (NALFON) 600 MG TABS tablet Take 600 mg by mouth in the morning and at bedtime.   Past Month   fluvoxaMINE  (LUVOX ) 100 MG tablet Take 1 tablet (100 mg total) by mouth at bedtime. 30 tablet 0 11/29/2023 Bedtime   gabapentin  (NEURONTIN ) 100 MG capsule Take 1 capsule (100 mg total) by mouth 3 (three) times daily. 90 capsule 0 11/29/2023 Bedtime   Lactobacillus (ACIDOPHILUS) 100 MG CAPS Take 1 capsule (100 mg total) by mouth 2 (two) times daily. (Patient taking differently: Take 250 mg by mouth daily.) 60 capsule 0 11/29/2023 Evening   lamoTRIgine  (LAMICTAL ) 100 MG tablet Take 200 mg by mouth at bedtime.   11/29/2023 Bedtime   levothyroxine  (SYNTHROID ) 88 MCG tablet Take 1 tablet (88 mcg total) by mouth daily before breakfast. 30 tablet 0 11/30/2023 Morning   losartan  (COZAAR ) 25 MG tablet Take 0.5 tablets (12.5 mg total) by mouth daily.   11/29/2023 Bedtime   Melatonin 10 MG TABS Take 10 mg by mouth at bedtime.   11/23/2023   metFORMIN  (GLUCOPHAGE ) 500 MG tablet Take 1 tablet (500 mg total) by mouth 2 (two) times daily with a meal. 60 tablet 0 11/23/2023   Multiple Vitamin (MULTIVITAMIN) tablet Take 1  tablet by mouth daily.     11/27/2023   Multiple Vitamins-Minerals (ZINC PO) Take 50 mg by mouth daily.   11/27/2023   nicotine  (NICODERM CQ  - DOSED IN MG/24 HOURS) 21 mg/24hr patch Place 21 mg onto the skin daily.   11/29/2023   nicotine  (NICODERM CQ  - DOSED IN MG/24 HR) 7 mg/24hr patch Place 1 patch (7 mg total) onto the skin daily. 28 patch 0 11/29/2023   nicotine  polacrilex (NICORETTE ) 4 MG gum Take 4 mg by mouth as needed for smoking cessation.   11/30/2023 at  9:30 AM   ondansetron  (ZOFRAN )  4 MG tablet Take 1 tablet (4 mg total) by mouth every 8 (eight) hours as needed for refractory nausea / vomiting. 20 tablet 0 Past Month   pantoprazole  (PROTONIX ) 40 MG tablet Take 40 mg by mouth every morning.   11/29/2023 Evening   potassium chloride  (KLOR-CON ) 10 MEQ tablet Take 20 mEq by mouth 3 (three) times daily.   11/23/2023   rosuvastatin  (CRESTOR ) 20 MG tablet Take 20 mg by mouth daily.   11/30/2023 at  8:30 AM   sodium chloride  1 g tablet Take 1 tablet (1 g total) by mouth 2 (two) times daily with a meal. 60 tablet 0 Past Week   traZODone  (DESYREL ) 100 MG tablet Take 1 tablet (100 mg total) by mouth at bedtime. 30 tablet 0 11/29/2023 Bedtime   VRAYLAR 1.5 MG capsule Take 1.5 mg by mouth at bedtime.   11/29/2023 Bedtime   denosumab (PROLIA) 60 MG/ML SOSY injection Inject 60 mg into the skin every 6 (six) months.   More than a month   Allergies:  Allergies  Allergen Reactions   Gallium (Radioactive Isotope) Swelling    Pt states she can not have radioactive injections.    Paxlovid [Nirmatrelvir-Ritonavir]     Family History  Problem Relation Age of Onset   Other Mother        alive   Stroke Father 20       deceased   Dementia Father    Chorea Maternal Grandfather    Dementia Maternal Aunt    Dementia Maternal Aunt    Heart attack Other        multiple uncles have died with myocardial infarction   Bladder Cancer Neg Hx    Uterine cancer Neg Hx    Social History:  reports that she has quit  smoking. Her smoking use included cigarettes. She has never been exposed to tobacco smoke. She has never used smokeless tobacco. She reports that she does not drink alcohol and does not use drugs.  ROS: A complete review of systems was performed.  All systems are negative except for pertinent findings as noted. ROS   Physical Exam:  Vital signs in last 24 hours: Temp:  [98 F (36.7 C)] 98 F (36.7 C) (05/14 1005) Pulse Rate:  [81] 81 (05/14 1005) Resp:  [16] 16 (05/14 1005) BP: (149)/(92) 149/92 (05/14 1005) SpO2:  [99 %] 99 % (05/14 1005) Weight:  [60.3 kg] 60.3 kg (05/14 1005) General:  Alert and oriented, No acute distress HEENT: Normocephalic, atraumatic Neck: No JVD or lymphadenopathy Cardiovascular: Regular rate and rhythm Lungs: Regular rate and effort Abdomen: Soft, nontender, nondistended, no abdominal masses Back: No CVA tenderness Extremities: No edema Neurologic: Grossly intact  Laboratory Data:  Results for orders placed or performed during the hospital encounter of 11/30/23 (from the past 24 hours)  Glucose, capillary     Status: Abnormal   Collection Time: 11/30/23 10:01 AM  Result Value Ref Range   Glucose-Capillary 109 (H) 70 - 99 mg/dL   Comment 1 Notify RN    No results found for this or any previous visit (from the past 240 hours). Creatinine: No results for input(s): "CREATININE" in the last 168 hours.  Impression/Assessment:  Colo vaginal fistula  Plan:  Proceed with cystoscopy with bilateral ureteral instillation of firefly.  Risk and benefits discussed and consent signed  Maralyn Sender, III 11/30/2023, 11:15 AM

## 2023-11-30 NOTE — Op Note (Signed)
 Operative Note  Preoperative diagnosis:  1.  Colovaginal fistula  Postoperative diagnosis: 1.  Colovaginal fistula 2.  Bladder tumor--large  Procedure(s): 1.  Cystoscopy with bilateral ureteral FireFly injections 2.  Transurethral resection of bladder tumor--large  Surgeon: Leila Punt, MD  Assistants:  None   Anesthesia:  General  Complications:  None  EBL:  <5 mL  Specimens: 1. None  Drains/Catheters: 1.  16 French Foley  Intraoperative findings:   Urethra was normal.  On the left lateral wall, bladder mucosa had raised superficial papillary appearing bladder mucosa that may represent low-grade bladder tumor versus chronic inflammation.  Entire abnormal area resected which was approximately 5 to 6 cm.  Indication:  The patient is a 68 year old female with a history of colovaginal fistula requiring a partial colectomy with Dr. Hershell Lose.  Urology has been consulted to performed cystoscopy with bilateral ureteral Fire Fly injection to aide in intraoperative ureteral identification. The patient has been consented for the above procedures, voices understanding and wishes to proceed.  The patient has been consented for the above procedures, voices understanding and wishes to procede  Description of procedure: After informed consent was obtained, the patient was brought to the operating room and general endotracheal anesthesia was administered. The patient was then placed in the dorsolithotomy position and prepped and draped in usual sterile fashion. A timeout was performed. A 21 French rigid cystoscope was then inserted into the urethral meatus and advanced into the bladder under direct vision. A complete bladder survey revealed no intravesical pathology.   A sensor wire was advanced up the left ureter followed by advancement of a 6 [place] open-ended catheter over the wire and up the ureter.  Wire was withdrawn and a total of 7.5 mL of firefly solution diluted with 10 mL of saline was  injected into the left collecting system. A similar maneuver was then carried out on the right with the same volume and concentration of firefly. The rigid cystoscope was then removed under direct vision.  A 26 French resectoscope with visual obturator in place was Carey into the urethra and into the bladder.  Exchanged for the bipolar working element and proceeded to resect the abnormal bladder mucosa and fulgurate the resection bed.  I collected the bladder specimen.  I inspected the bladder mucosa and there was no active bleeding noted.  No evidence of perforation.  I withdrew the scope.   A 16 French Foley catheter was then inserted and placed to gravity drainage. The patient tolerated the procedure well. Dr. Hershell Lose then proceeded with their portion of the case  Plan:  Foley removal is at the discretion of the primary team.

## 2023-11-30 NOTE — Op Note (Signed)
 11/30/2023  4:02 PM  PATIENT:  [name]  68 y.o. female  Patient Care Team: Dorena Gander, MD as PCP - General (Family Medicine) Hugh Madura, MD as PCP - Cardiology (Cardiology) Candyce Champagne, MD as Consulting Physician (General Surgery) Darlene Ehlers, MD as Consulting Physician (Gynecology) Felecia Hopper, MD as Consulting Physician (Gastroenterology) Ernie Heal, Jerelyn Money, MD as Consulting Physician (Infectious Diseases)  PRE-OPERATIVE DIAGNOSIS:   POST-OPERATIVE DIAGNOSIS:  COLOVAGINAL FISTULA  PROCEDURE:   -ROBOTIC LOW ANTERIOR RECTOSIGMOID RESECTION (LAR) WITH ANASTOMOSIS -COLOVAGINAL FISTULA TAKEDOWN & REPAIR -OMENTOPEXY OF VAGINA -INTRAOPERATIVE ASSESSMENT OF TISSUE VASCULAR PERFUSION USING ICG (indocyanine green) IMMUNOFLUORESCENCE -TRANSVERSUS ABDOMINIS PLANE (TAP) BLOCK - BILATERAL -FLEXIBLE SIGMOIDOSCOPY  SURGEON:  Eddye Goodie, MD  ASSISTANT:  Freddrick Jaffe, MD  An experienced assistant was required given the standard of surgical care given the complexity of the case.  This assistant was needed for exposure, dissection, suction, tissue approximation, retraction, perception, etc  ANESTHESIA:  General endotracheal intubation anesthesia (GETA) and Regional TRANSVERSUS ABDOMINIS PLANE (TAP) nerve block -BILATERAL for perioperative & postoperative pain control at the level of the transverse abdominis & preperitoneal spaces along the flank at the anterior axillary line, from subcostal ridge to iliac crest under laparoscopic guidance provided with liposomal bupivacaine  (Experel) 20mL mixed with 30 mL of bupivicaine 0.25% with epinephrine   Estimated Blood Loss (EBL):   Total I/O In: 800 [I.V.:700; IV Piggyback:100] Out: 900 [Urine:800; Blood:100].   (See anesthesia record)  Delay start of Pharmacological VTE agent (>24hrs) due to concerns of significant anemia, surgical blood loss, or risk of bleeding?:  no  DRAINS: (None) and 19 Fr Blake drain the tip  resting in the pelvis  SPECIMEN:  Rectosigmoid (open end proximal) and Distal anastomotic ring (FINAL DISTAL MARGIN)  DISPOSITION OF SPECIMEN:  Pathology  COUNTS:  Sponge, needle, & instrument counts CORRECT at the conclusion of the case.      PLAN OF CARE: Admit to inpatient   PATIENT DISPOSITION:  PACU - hemodynamically stable.  INDICATION:    68 year old woman with history of diverticular disease developed vaginal drainage and evidence of colovaginal fistula.  Multiple medical problems quit smoking but now abstinent.  Colonoscopy yesterday showed no evidence of any cancer or malignancy.  I recommended segmental resection:  The anatomy & physiology of the digestive tract was discussed.  The pathophysiology of colorectal fistula to the vagina was discussed.  Natural history risks without surgery was discussed. I worked to give an overview of the disease and the frequent need to have multispecialty involvement.   I feel the risks of no intervention will lead to serious problems that outweigh the operative risks; therefore, I recommended surgery to treat the pathology.  Minimally Invasive (robotic/laparoscopic) & open techniques for probable colectomy were discussed.  Repair of the vaginal opening with possible need for intervening material to prevent recurrence such as omentum, muscle, mesh, etc was discussed.  Possible fecal diversion by ostomy was discussed.  We will work to preserve anal & pelvic floor function without sacrificing cure.  Risks such as bleeding, infection, abscess, leak, injury to other organs, need for repair of tissues / organs, recurrence with reoperation, possible ostomy, hernia, heart attack, death, and other risks were discussed.  I noted a good likelihood this will help address the problem.   Goals of post-operative recovery were discussed as well.  We will work to minimize complications.  Educational information was given as well.  Questions were answered.    The  patient  expresses understanding & wishes to proceed with surgery.   OR FINDINGS:   Patient had very thickened and twisted sigmoid colon onto the proximal rectum with dense adhesions to the anterior and left lateral pelvis.  Resection done.  Whole and left apex of stretched out vaginal cuff.  Colovaginal fistula primarily.  But tissues very thickened of poor quality.  Therefore omentopexy done over this as well.  No obvious metastatic disease on visceral parietal peritoneum or liver.  Bladder with mucosal changes status post TURBT resection cystoscopically by Dr. Parke Boll.  No other abnormalities in the genitourinary tract.  No evidence of colovesical fistula.    It is a 29mm EEA anastomosis ( distal descending colon  connected to mid rectum.)  It rests 7-8 cm from the anal verge by flexible sigmoidoscopy  CASE DATA: Type of patient?: Elective [facility] Private Case Status of Case? Elective Scheduled Infection Present At Time Of Surgery (PATOS)?  PHLEGMON   DESCRIPTION:   Informed consent was confirmed.  The patient underwent general anaesthesia without difficulty.  The patient was positioned appropriately.  VTE prevention in place.  Patient underwent cystoscopy with resection of bladder polypoid tissue by TURBT and insufflation of ICG firefly in both ureters by Dr. Parke Boll with [facility].  Please see separate operative note.  The patient was clipped, prepped, & draped in a sterile fashion.  Surgical timeout confirmed our plan.  The patient was positioned in reverse Trendelenburg.  Abdominal entry was gained using Varess technique at the left subcostal ridge on the anterior abdominal wall.  No elevated EtCO2 noted.  Port placed.  Camera inspection revealed no injury.  Extra ports were carefully placed under direct laparoscopic visualization.  Upon entering the abdomen (organ space),we encountered a phlegmon involving the sigmoid colon.   I reflected the greater omentum and the upper abdomen the  small bowel in the upper abdomen.  The patient was carefully positioned.  The Intuitive daVinci robot was docked with camera & instruments carefully placed.  The patient had concrete adhesions of the colon to the left lateral and anterior pelvis.  Decided to focus on a retroperitoneal medial to lateral approach at first.  I mobilized the rectosigmoid colon & elevated it to put the main pedicle on tension.  I scored the base of peritoneum of the medial side of the mesentery of the elevated left colon from the ligament of Treitz to the mid rectum.   I elevated the sigmoid mesentery and entered into the retro-mesenteric plane. We were able to identify the left ureter and gonadal vessels. We kept those posterior within the retroperitoneum and elevated the left colon mesentery off that.  Had to come space and then more cephalad between the inferior mesenteric vein and artery to get good identification.  She had a very narrowed inflamed stretched out mesentery densely adherent to the retroperitoneum especially in the left internal iliacs.  Took care to elevate isolate and come through those.  With this we were able to consistently isolate the inferior mesenteric artery (IMA) pedicle but did not ligate it yet.  I continued distally and got into the avascular plane posterior to the mesorectum, sparing the nervi ergentes.. This allowed me to help mobilize the rectum as well by freeing the mesorectum off the sacrum.  I stayed away from the right and left ureters.  I kept the lateral vascular pedicles to the rectum intact.   I skeletonized the lymph nodes off the inferior mesenteric artery pedicle.  I went down to its  takeoff from the aorta.   I isolated the inferior mesenteric vein off of the ligament of Treitz just cephalad to that as well.  After confirming the left ureter was out of the way, I went ahead and ligated the inferior mesenteric artery pedicle just near its takeoff from the aorta.  I did ligate the inferior  mesenteric vein in a similar fashion.  We ensured hemostasis.  I continued medial to lateral dissection to free the left colon mesentery off the retroperitoneum going up towards the splenic flexure to allow good mobility and protect the colon mesentery.  Freed off dense adhesions to thinned out left Gerota's fascia fat pad on the kidney.  I mobilized the left colon in a lateral to medial fashion off the retroperitoneum and sidewall attachments along the line of Toldt up towards the splenic flexure to ensure good mobilization of the remaining left colon to reach into the pelvis.   We then focused on mesorectal dissection.  Freed the mesorectum off the presacral plane until I was distal to the concerning region.  Freed off peritoneum on the lateral sidewalls as well and transected the mesentery of the lateral pedicles to get distal to the area of concern.  The colon was quite corkscrew to and folded upon itself.  Ended up having to go transanally with blunt sizers to confirm where the rectum was.  Also used to note a stretched out floppy vaginal cuff going into the left anterolateral pelvis where the epicenter of adhesions were.  Consistent with clinical diagnosis of colovaginal fistula.  The rectosigmoid had a tight hairpin turn densely adherent to the left anterior soft pelvis.  Eventually was able to carefully free off the rectosigmoid colon from the anterior pelvis.  Came around anteriorly such that I had good circumferential mesorectal excision and a good margin distal to the area of concern.  This took some time.  Mention was able to free off peritoneal coverings that were quite thickened on the proximal and mid rectal pedicles to thin that out stretch out to better identify.  Freed the rectum posteriorly off the distal sacrum getting close to the coccyx to have better mobility and identification.   With this I had the circulator insufflate the bladder with methylene stain sterile solution.  Got several 100  mL of insufflation of the bladder and confirmed of the bladder was away from the epicenter phlegmon.  It was a stretched out vaginal cuff.  No evidence of any bladder leak or injury.  Reassuring.  No evidence of colovesical fistula.   I chose a region at the descending/sigmoid junction that was soft.  Did allow more mobilization in the left upper quadrant and took a little more base of the mesentery to get the left colon to stretch down better towards the mid rectum.  Tissues were thinned out with dense inflammation so came to the mid rectum where it was soft and intact.  I skeletonized the mesorectum at the mid rectum.  We then chose a region for the proximal margin that would reach well for our planned anastomosis (distal descending colon).  Transected the colon mesentery radially to preserve good collateral and marginal artery blood supply.    To assess vascular perfusion of tissues, we asked anesthesia use intravenous  indocyanine green (ICG) with IV flush.  I switched to the NIR fluorescence (Firefly mode) imaging window on the daVinci robot platform.  We were able to see good light green visualization of blood vessels with good vascular perfusion  of tissues, confirming good tissue perfusion of tissues (distal ascending colon and mid rectum) planned for anastomosis.  Then transected at the distal margin (mid rectum) with a robotic stapler.  Then went down below to confirm the epicenter of the vaginal cuff with probable fistula on the left lateral pelvis.  Stretched out and more proximal and lateral than usual.  Debrided back to healthy bleeding tissue.  Still rather thick and inflamed but viable.  I closed down the hole in the vaginal cuff using 2-0 V-Loc serrated absorbable suture in a running horizontal mattress transverse fashion..  Very thickened tissues but got it mostly closed.  Worked to stay away from the ureter and retroperitoneum.  We created an extraction incision through a small Pfannenstiel  incision in the suprapubic region.  Placed a wound protector.  I was able to eviscerate the rectosigmoid and descending colon out the wound.   I clamped the colon proximal to this area using a reusable pursestringer device.  Passed a 2-0 Keith needle. I transected at the descending/sigmoid junction with a scalpel. I got healthy bleeding mucosa.  We sent the rectosigmoid colon specimen off to go to pathology.  We sized the colon orifice.  I chose a 29mm EEA anvil stapler system.  I reinforced the prolene pursestring with interrupted silk "belt loop" sutures.  I placed the anvil to the open end of the proximal remaining colon and closed around it using the pursestring.    We did copious irrigation with crystalloid solution.  Hemostasis was good.  The distal end of the remaining colon easily reached down to the rectal stump, therefore, splenic flexure mobilization was not needed.  Robot was redocked.     Dr Ramiro Burly scrubbed down and did gentle anal dilation and advanced the EEA stapler up the rectal stump. The spike was brought out at the provimal end of the rectal stump under direct visualization.  I attached the anvil of the proximal colon the spike of the stapler. Anvil was tightened down and held clamped for 60 seconds.  Orientation was confirmed such that there is no twisting of the colon nor small bowel underneath the mesenteric defect. No concerning tension.  The EEA stapler was fired and held clamped for 30 seconds. The stapler was released & removed. Blue stitch is in the proximal ring.  Care was taken to ensure no other structures were incorporated within this either.  We noted 2 excellent anastomotic rings.   The colon proximal to the anastomosis was then gently occluded. The pelvis was filled with sterile irrigation.  Dr Ramiro Burly did flexible sigmoidoscopy.  Noted the anastomosis was at 7 cm from the anal verge consistent with the mid rectum.  Intact anastomosis without active bleeding.  No mucosal  ischemia.  There was a negative air leak test. There was no tension of mesentery or bowel at the anastomosis.   Tissues looked viable.  Ureters & bowel uninjured.  The anastomosis looked healthy.   Greater omentum positioned down into the pelvis to help protect the anastomosis.  I laid omentum over the apex of the vaginal cuff or was able to partially close to do a good omentopexy to have healthy tissue to plug the inflamed hole.  Did that with 2-0 V-Loc in a running horizontal mattress fashion to good result.  Because it was rather thickened and inflamed with poor tissues decided to leave the pelvic drain.  Endoluminal gas was evacuated.  Ports & wound protector removed.  We changed gloves & redraped  the patient per colon SSI prevention protocol.  We aspirated the sterile irrigation.  Hemostasis was good.  Sterile unused instruments were used from this point.  I closed the skin at the port sites using Monocryl stitch and sterile dressing.  We assured hemostasis and the former ostomy wound.  Wound irrigated.  I closed the posterior rectus fascia with 0 Vicryl suture.  Anterior rectus fascia was closed using #1 PDS transversely.  Sterile dressing placed.   Patient is being extubated go to recovery room. I had discussed postop care with the patient in detail the office & in the holding area. Instructions are written. I discussed operative findings, updated the patient's status, discussed probable steps to recovery, and gave postoperative recommendations to the patient's daughter, Laine Rendleman, and husband, Tranise Tuazon..  Recommendations were made.  Questions were answered.  They expressed understanding & appreciation.  Eddye Goodie, M.D., F.A.C.S. Gastrointestinal and Minimally Invasive Surgery [facility] [address], [address] [place], [place] [address] [phone] Main / Paging

## 2023-12-01 ENCOUNTER — Encounter (HOSPITAL_COMMUNITY): Payer: Self-pay | Admitting: Surgery

## 2023-12-01 LAB — CBC
HCT: 23.6 % — ABNORMAL LOW (ref 36.0–46.0)
HCT: 28.3 % — ABNORMAL LOW (ref 36.0–46.0)
Hemoglobin: 7 g/dL — ABNORMAL LOW (ref 12.0–15.0)
Hemoglobin: 8.4 g/dL — ABNORMAL LOW (ref 12.0–15.0)
MCH: 25.6 pg — ABNORMAL LOW (ref 26.0–34.0)
MCH: 25.3 pg — ABNORMAL LOW (ref 26.0–34.0)
MCHC: 29.7 g/dL — ABNORMAL LOW (ref 30.0–36.0)
MCHC: 29.7 g/dL — ABNORMAL LOW (ref 30.0–36.0)
MCV: 86.4 fL (ref 80.0–100.0)
MCV: 85.2 fL (ref 80.0–100.0)
Platelets: 427 10*3/uL — ABNORMAL HIGH (ref 150–400)
Platelets: 546 10*3/uL — ABNORMAL HIGH (ref 150–400)
RBC: 2.73 MIL/uL — ABNORMAL LOW (ref 3.87–5.11)
RBC: 3.32 MIL/uL — ABNORMAL LOW (ref 3.87–5.11)
RDW: 19.4 % — ABNORMAL HIGH (ref 11.5–15.5)
RDW: 19.4 % — ABNORMAL HIGH (ref 11.5–15.5)
WBC: 14.1 10*3/uL — ABNORMAL HIGH (ref 4.0–10.5)
WBC: 14.4 10*3/uL — ABNORMAL HIGH (ref 4.0–10.5)
nRBC: 0 % (ref 0.0–0.2)
nRBC: 0 % (ref 0.0–0.2)

## 2023-12-01 LAB — BASIC METABOLIC PANEL WITH GFR
Anion gap: 5 (ref 5–15)
BUN: <5 mg/dL — ABNORMAL LOW (ref 8–23)
CO2: 18 mmol/L — ABNORMAL LOW (ref 22–32)
Calcium: 6.1 mg/dL — CL (ref 8.9–10.3)
Chloride: 112 mmol/L — ABNORMAL HIGH (ref 98–111)
Creatinine, Ser: 0.44 mg/dL (ref 0.44–1.00)
GFR, Estimated: >60 mL/min (ref >60)
Glucose, Bld: 146 mg/dL — ABNORMAL HIGH (ref 70–99)
Potassium: 3.6 mmol/L (ref 3.5–5.1)
Sodium: 135 mmol/L (ref 135–145)

## 2023-12-01 LAB — MAGNESIUM: Magnesium: 1.4 mg/dL — ABNORMAL LOW (ref 1.7–2.4)

## 2023-12-01 LAB — TYPE AND SCREEN
ABO/RH(D): O POS
Antibody Screen: NEGATIVE

## 2023-12-01 MED ORDER — DOXYCYCLINE HYCLATE 100 MG PO TABS
100.0000 mg | ORAL_TABLET | Freq: Two times a day (BID) | ORAL | Status: DC
Start: 2023-12-01 — End: 2023-12-04
  Administered 2023-12-01 – 2023-12-04 (×7): 100 mg via ORAL
  Filled 2023-12-01 (×7): qty 1

## 2023-12-01 MED ORDER — LACTATED RINGERS IV BOLUS
1000.0000 mL | Freq: Once | INTRAVENOUS | Status: AC
  Administered 2023-12-01: 1000 mL via INTRAVENOUS

## 2023-12-01 MED ORDER — LACTATED RINGERS IV BOLUS: 1000.0000 mL | Freq: Three times a day (TID) | INTRAVENOUS | Status: AC | PRN

## 2023-12-01 MED ORDER — NICOTINE POLACRILEX 2 MG MT GUM: 4.0000 mg | CHEWING_GUM | OROMUCOSAL | Status: DC | PRN

## 2023-12-01 MED ORDER — HYDROMORPHONE HCL 1 MG/ML IJ SOLN
1.0000 mg | Freq: Once | INTRAMUSCULAR | Status: AC
  Administered 2023-12-01: 1 mg via INTRAVENOUS
  Filled 2023-12-01: qty 1

## 2023-12-01 MED ORDER — HYDROMORPHONE HCL 1 MG/ML IJ SOLN
0.5000 mg | INTRAMUSCULAR | Status: DC | PRN
  Administered 2023-12-01 – 2023-12-04 (×14): 1 mg via INTRAVENOUS
  Filled 2023-12-01 (×7): qty 1
  Filled 2023-12-01: qty 2
  Filled 2023-12-01 (×7): qty 1

## 2023-12-01 MED ORDER — IRON SUCROSE 200 MG IVPB - SIMPLE MED
200.0000 mg | Freq: Once | Status: AC
  Administered 2023-12-01: 200 mg via INTRAVENOUS
  Filled 2023-12-01: qty 200

## 2023-12-01 MED ORDER — AMOXICILLIN-POT CLAVULANATE 875-125 MG PO TABS
1.0000 | ORAL_TABLET | Freq: Two times a day (BID) | ORAL | Status: DC
  Administered 2023-12-01 – 2023-12-04 (×7): 1 via ORAL
  Filled 2023-12-01 (×7): qty 1

## 2023-12-01 MED ORDER — OXYCODONE HCL 5 MG PO TABS
5.0000 mg | ORAL_TABLET | ORAL | Status: DC | PRN
  Administered 2023-12-01 – 2023-12-04 (×15): 15 mg via ORAL
  Filled 2023-12-01 (×15): qty 3

## 2023-12-01 NOTE — Progress Notes (Addendum)
 During morning assessment pt complaining of severe indigestion. Scheduled and PRN meds given.   This afternoon pt up with mobility complaining of 10/10 R sided chest pain.   Vitals stable. BP: 119/85 P: 90 O2: 95 RA. Obtained EKG showing NSR. PRN pain meds unavailable for 30 minutes. Will administer as appropriate.   On call MD paged and aware. Gross, MD secure chat per on-call MD. Awaiting new orders.

## 2023-12-01 NOTE — TOC Initial Note (Signed)
 Transition of Care [facility]) - Initial/Assessment Note    Patient Details  Name: [name] MRN: [id number] Date of Birth: 1956-04-18  [facility] CM/SW Contact:    Bari Leys, RN Phone Number: 12/01/2023, 12:34 PM  Clinical Narrative:  Per bedside nurse, patient c/o chest pain. TOC will continue to follow for initial assessment.                       Patient Goals and CMS Choice            Expected Discharge Plan and Services                                              Prior Living Arrangements/Services                       Activities of Daily Living   ADL Screening (condition at time of admission) Independently performs ADLs?: Yes (appropriate for developmental age) Is the patient deaf or have difficulty hearing?: No Does the patient have difficulty seeing, even when wearing glasses/contacts?: No Does the patient have difficulty concentrating, remembering, or making decisions?: No  Permission Sought/Granted                  Emotional Assessment              Admission diagnosis:  Colovaginal fistula [N82.4] Diverticulosis [K57.90] Patient Active Problem List   Diagnosis Date Noted   Colovaginal fistula 11/30/2023   Diskitis 10/04/2023   Hypotension 09/06/2023   Fracture of tenth thoracic vertebra (HCC) 09/05/2023   History of discitis 09/05/2023   Senile osteoporosis 09/05/2023   History of urinary retention 09/05/2023   Vaginal atrophy 09/05/2023   Nocturia 09/05/2023   Vulvar ulcer 09/05/2023   Pulmonary nodule 08/02/2023   Non-traumatic compression fracture of vertebral column (HCC) 06/24/2023   Nonobstructive atherosclerosis of coronary artery 02/10/2023   Bilateral pseudophakia 02/03/2023   Vertebral fracture, osteoporotic (HCC) 01/06/2023   Ventriculitis of brain due to bacteria 11/02/2022   Anemia of chronic illness 10/12/2022   Bipolar disorder, in partial remission, most recent episode  depressed (HCC) 10/07/2022   Adjustment disorder with mixed anxiety and depressed mood 09/30/2022   Chronic wound 09/25/2022   Hyperglycemia 09/25/2022   Chronic midline thoracic back pain 09/17/2022   Primary insomnia 09/17/2022   Chronic combined systolic and diastolic CHF (congestive heart failure) (HCC) 09/14/2022   Acute blood loss anemia 09/09/2022   Infection of lumbar spine (HCC) 09/03/2022   Malnutrition of moderate degree 08/20/2022   Hardware complicating wound infection (HCC) 08/20/2022   Infection of deep incisional surgical site after procedure 08/20/2022   Aspiration pneumonia of both lungs (HCC) 08/19/2022   Acute respiratory failure with hypoxia (HCC) 08/19/2022   Sepsis (HCC) 08/19/2022   Seizure (HCC) 08/19/2022   Wound dehiscence 08/09/2022   Delayed surgical wound healing 07/08/2022   Fracture of lumbar spine without cord injury (HCC) 06/26/2022   Lumbar vertebral fracture (HCC) 06/25/2022   Mood disorder (HCC) 09/29/2021   Generalized muscle weakness 04/30/2019   Mild cognitive impairment 09/06/2018   Hyponatremia 02/28/2018   S/P shoulder replacement, left 08/19/2016   Anxiety 10/17/2014   Acid reflux 10/17/2014   BP (high blood pressure) 10/17/2014   Arthritis, degenerative 10/17/2014   Adult hypothyroidism 10/17/2014   UTI (  urinary tract infection) 06/03/2014   Fracture of bone adjacent to prosthesis 06/03/2014   Peri-prosthetic fracture of femur following total hip arthroplasty 06/02/2014   Urinary retention 05/13/2014   Chronic pain 05/10/2014   History of hip surgery 05/10/2014   Osteoarthritis 09/30/2011   UNSPECIFIED HEART FAILURE 06/24/2010   UNSPECIFIED CONGENITAL DEFECT OF SEPTAL CLOSURE 06/24/2010   Secondary cardiomyopathy (HCC) 03/18/2010   Tobacco use 03/18/2010   DM 06/13/2009   HYPERTENSION, UNSPECIFIED 06/13/2009   VENTRICULAR TACHYCARDIA 06/13/2009   VENTRICULAR SEPTAL DEFECT, CONGENITAL 06/13/2009   Essential (primary)  hypertension 06/13/2009   Diabetes mellitus type 2, noninsulin dependent (HCC) 06/13/2009   PCP:  Dorena Gander, MD Pharmacy:   [id number] - [place], [place] - [address] AT [facility] OF [address] [place] [address] Phone: [phone] Fax: [phone]  [id number] - [place], [place] - [address] AT [facility] OF [address] [place] [address] Phone: [phone] Fax: [phone]     Social Drivers of Health (SDOH) Social History: SDOH Screenings   Food Insecurity: No Food Insecurity (11/30/2023)  Housing: Low Risk  (11/30/2023)  Transportation Needs: No Transportation Needs (11/30/2023)  Utilities: Not At Risk (11/30/2023)  Depression (PHQ2-9): Medium Risk (10/20/2023)  Financial Resource Strain: Low Risk  (07/08/2023)   Received from [facility]  Social Connections: Socially Integrated (11/30/2023)  Tobacco Use: Medium Risk (11/30/2023)   SDOH Interventions:     Readmission Risk Interventions    09/03/2022    2:08 PM 07/14/2022    2:53 PM  Readmission Risk Prevention Plan  Transportation Screening Complete Complete  PCP or Specialist Appt within 5-7 Days  Complete  Home Care Screening  Complete  Medication Review (RN CM)  Complete  Medication Review (RN Care Manager) Complete   PCP or Specialist appointment within 3-5 days of discharge Not Complete   PCP/Specialist Appt Not Complete comments pt going to INPT rehab   HRI or Home Care Consult Complete   SW Recovery Care/Counseling Consult Complete   Palliative Care Screening Not Applicable   Skilled Nursing Facility Not Applicable

## 2023-12-01 NOTE — Progress Notes (Signed)
 PT Cancellation Note  Patient Details Name: [name] MRN: [id number] DOB: July 14, 1956   Cancelled Treatment:    Reason Eval/Treat Not Completed: Medical issues which prohibited therapy (pt reported chest pain, pt currently getting EKG. Will follow.)   Daymon [name] PT 12/01/2023  Acute Rehabilitation Services  Office [phone]

## 2023-12-01 NOTE — Progress Notes (Signed)
 Mobility Specialist - Progress Note   12/01/23 1052  Mobility  Activity Transferred from bed to chair  Level of Assistance Standby assist, set-up cues, supervision of patient - no hands on  Assistive Device Front wheel walker  Distance Ambulated (ft) 2 ft  Activity Response Tolerated well  Mobility Referral Yes  Mobility visit 1 Mobility  Mobility Specialist Start Time (ACUTE ONLY) 1035  Mobility Specialist Stop Time (ACUTE ONLY) 1049  Mobility Specialist Time Calculation (min) (ACUTE ONLY) 14 min   Pt received in bed requesting assistance to the recliner. Pt c/o RU chest pain. RN made aware. No other complaints during session. Pt to recliner after session with all needs met. NT & RN in room.   [facility]

## 2023-12-01 NOTE — Progress Notes (Signed)
 Pt's had critical lab Hgb-7 and CA+-6.1. Notified to oncall Dr Aldon Hung. Ordered for cross match and repeat CBC. Received consent for blood transfusion. Bloody drainage from JP drain and draining bloody urine from foley cath. Patient refused to ambulate but she said that she will do after she receive her next dose of dilaudid . Will continue to monitor.

## 2023-12-01 NOTE — Evaluation (Signed)
 Occupational Therapy Evaluation Patient Details Name: [name] MRN: [id number] DOB: 03/23/1956 Today's Date: 12/01/2023   History of Present Illness   68 year old female with colovaginal fistula presents for robotic colectomy and fistula takedown 11/30/23. PMH: hypotension, mild cognitive impairment, R RCR, R THA, L TKA, L TSA, back surgery.     Clinical Impressions PTA, patient was living at home with husband/family and PCA support and assistance following a prolonged hospital/rehab/HH and outpatient course of therapy. Patient's baseline is amb with RW household distances and use of w/c for community with mod I for BADL's except shoes and laces. Currently, patient presents with the following deficits (see OT Problem List for details) most significantly pain, generalized muscle weakness, decreased balance and activity tolerance for BADL';s and functional mobility. Patient will benefit from Acute OT services to progress safety and performance level and allow for discharge home with family and PCA assist and support and HHOT services.      If plan is discharge home, recommend the following:   A little help with walking and/or transfers;A lot of help with bathing/dressing/bathroom;Assistance with cooking/housework;Direct supervision/assist for medications management;Direct supervision/assist for financial management;Assist for transportation;Help with stairs or ramp for entrance     Functional Status Assessment   Patient has had a recent decline in their functional status and demonstrates the ability to make significant improvements in function in a reasonable and predictable amount of time.     Equipment Recommendations   None recommended by OT      Precautions/Restrictions   Precautions Precautions: Other (comment) Recall of Precautions/Restrictions: Intact Precaution/Restrictions Comments: reviewed log roll Restrictions Weight Bearing Restrictions Per Provider  Order: No     Mobility Bed Mobility Overal bed mobility: Needs Assistance Bed Mobility: Rolling, Sit to Supine Rolling: Supervision Sidelying to sit: Supervision, HOB elevated, Used rails   Sit to supine: Min assist, HOB elevated, Used rails   General bed mobility comments: LE management due to fatigue and pain prevention    Transfers Overall transfer level: Needs assistance Equipment used: Rolling walker (2 wheels) Transfers: Sit to/from Stand, Bed to chair/wheelchair/BSC Sit to Stand: Contact guard assist     Step pivot transfers: Contact guard assist     General transfer comment: increased time      Balance Overall balance assessment: Modified Independent                                         ADL either performed or assessed with clinical judgement   ADL Overall ADL's : Needs assistance/impaired Eating/Feeding: Independent   Grooming: Wash/dry hands;Wash/dry face;Oral care;Sitting;Set up   Upper Body Bathing: Contact guard assist;Sitting   Lower Body Bathing: Moderate assistance;Sitting/lateral leans;Sit to/from stand   Upper Body Dressing : Contact guard assist;Sitting   Lower Body Dressing: Moderate assistance;Sitting/lateral leans;Sit to/from stand   Toilet Transfer: Contact guard assist   Toileting- Clothing Manipulation and Hygiene: Minimal assistance;Sitting/lateral lean;Sit to/from stand       Functional mobility during ADLs: Contact guard assist;Rolling walker (2 wheels) General ADL Comments: decreased functional reach to LE's     Vision Baseline Vision/History: 0 No visual deficits Vision Assessment?: No apparent visual deficits     Perception Perception: Within Functional Limits       Praxis Praxis: WFL       Pertinent Vitals/Pain Pain Assessment Pain Assessment: 0-10 Pain Score: 4  Pain Location: low back (chronic) and pelvic  surgical sight Pain Descriptors / Indicators: Pounding Pain Intervention(s): Limited  activity within patient's tolerance, Monitored during session, Repositioned, Premedicated before session, Relaxation, Ice applied     Extremity/Trunk Assessment Upper Extremity Assessment Upper Extremity Assessment: Right hand dominant;Overall WFL for tasks assessed   Lower Extremity Assessment Lower Extremity Assessment: Defer to PT evaluation RLE Deficits / Details: RLE shorter than LLE (2* multiple hip surgeries per pt), doesn't wear built up shoe (has tried them), knee ext 4/5   Cervical / Trunk Assessment Cervical / Trunk Assessment: Kyphotic   Communication Communication Communication: No apparent difficulties   Cognition Arousal: Alert Behavior During Therapy: WFL for tasks assessed/performed Cognition: No apparent impairments             OT - Cognition Comments: known to this clinican and patient doing we4ll with recall, processing and safety this admission just reports fatigue and slower processing with certain meds                 Following commands: Intact       Cueing  General Comments   Cueing Techniques: Verbal cues  no edema nor SOB noted           Home Living Family/patient expects to be discharged to:: Private residence Living Arrangements: Spouse/significant other Available Help at Discharge: Family;Personal care attendant Type of Home: House Home Access: Ramped entrance     Home Layout: One level     Bathroom Shower/Tub: Producer, television/film/video: Handicapped height Bathroom Accessibility: Yes How Accessible: Accessible via wheelchair;Accessible via walker Home Equipment: Rolling Walker (2 wheels);Rollator (4 wheels);Wheelchair - manual;BSC/3in1;Shower seat - built in;Grab bars - tub/shower   Additional Comments: lives with spouse who works at [employer]; aide comes 10-4 Tues, Wed, [employer]      Prior Functioning/Environment Prior Level of Function : Independent/Modified Independent             Mobility  Comments: walks with RW short distances, WC for going out; no falls in past 6 months ADLs Comments: doesn't drive, independent bathing/dressing    OT Problem List: Decreased strength;Decreased activity tolerance;Impaired balance (sitting and/or standing);Pain   OT Treatment/Interventions: Self-care/ADL training;Therapeutic exercise;Neuromuscular education;Energy conservation;DME and/or AE instruction;Therapeutic activities;Patient/family education;Balance training      OT Goals(Current goals can be found in the care plan section)   Acute Rehab OT Goals Patient Stated Goal: [place] the beach in [place] OT Goal Formulation: With patient/family Time For Goal Achievement: 12/15/23 Potential to Achieve Goals: Good ADL Goals Pt Will Perform Lower Body Bathing: with contact guard assist;sitting/lateral leans;sit to/from stand;with adaptive equipment Pt Will Perform Lower Body Dressing: with min assist;sitting/lateral leans;sit to/from stand;with adaptive equipment Pt Will Transfer to Toilet: with contact guard assist;ambulating;bedside commode;regular height toilet;grab bars Pt Will Perform Toileting - Clothing Manipulation and hygiene: with contact guard assist;sitting/lateral leans;sit to/from stand Pt/caregiver will Perform Home Exercise Program: Increased strength;Independently;With written HEP provided;Both right and left upper extremity   OT Frequency:  Min 2X/week    Co-evaluation              AM-PAC OT "6 Clicks" Daily Activity     Outcome Measure Help from another person eating meals?: A Little Help from another person taking care of personal grooming?: A Little Help from another person toileting, which includes using toliet, bedpan, or urinal?: A Little Help from another person bathing (including washing, rinsing, drying)?: A Little Help from another person to put on and taking off regular upper body clothing?: A  Little Help from another person to put on and taking off  regular lower body clothing?: A Lot 6 Click Score: 17   End of Session Equipment Utilized During Treatment: Gait belt;Rolling walker (2 wheels) Nurse Communication: Mobility status  Activity Tolerance: Patient limited by fatigue;Patient limited by pain Patient left: in bed;with call bell/phone within reach;with bed alarm set;with family/visitor present  OT Visit Diagnosis: Unsteadiness on feet (R26.81);Muscle weakness (generalized) (M62.81);Pain Pain - part of body:  (back)                Time: 1450-1515 OT Time Calculation (min): 25 min Charges:  OT General Charges $OT Visit: 1 Visit OT Evaluation $OT Eval Low Complexity: 1 Low OT Treatments $Therapeutic Activity: 8-22 mins  Rhyatt Muska OT/L Acute Rehabilitation Department  [phone]  12/01/2023, 3:29 PM

## 2023-12-01 NOTE — Evaluation (Signed)
 Physical Therapy Evaluation Patient Details Name: [name] MRN: [id number] DOB: [birth year] Today's Date: 12/01/2023  History of Present Illness  68 year old female with colovaginal fistula presents for robotic colectomy and fistula takedown 11/30/23. PMH: hypotension, mild cognitive impairment, R RCR, R THA, L TKA, L TSA, back surgery.  Clinical Impression  Pt admitted with above diagnosis. Pt ambulated 25' with RW, distance limited by fatigue and pain. She uses a RW at baseline, she's not had falls in the past 6 months. Good progress expected.  Pt currently with functional limitations due to the deficits listed below (see PT Problem List). Pt will benefit from acute skilled PT to increase their independence and safety with mobility to allow discharge.           If plan is discharge home, recommend the following: A little help with walking and/or transfers;A little help with bathing/dressing/bathroom;Assistance with cooking/housework;Assist for transportation;Help with stairs or ramp for entrance   Can travel by private vehicle        Equipment Recommendations None recommended by PT  Recommendations for Other Services       Functional Status Assessment Patient has had a recent decline in their functional status and demonstrates the ability to make significant improvements in function in a reasonable and predictable amount of time.     Precautions / Restrictions Precautions Precautions: Other (comment) (pelvic surgery) Recall of Precautions/Restrictions: Intact Precaution/Restrictions Comments: reviewed log roll Restrictions Weight Bearing Restrictions Per Provider Order: No      Mobility  Bed Mobility Overal bed mobility: Needs Assistance Bed Mobility: Rolling, Sidelying to Sit Rolling: Supervision Sidelying to sit: Supervision, HOB elevated, Used rails       General bed mobility comments: VCs for log roll technique    Transfers Overall transfer level: Needs  assistance Equipment used: Rolling walker (2 wheels) Transfers: Sit to/from Stand Sit to Stand: Contact guard assist           General transfer comment: CGA 2* pt reported dizziness from pain medication    Ambulation/Gait Ambulation/Gait assistance: Contact guard assist Gait Distance (Feet): 25 Feet Assistive device: Rolling walker (2 wheels) Gait Pattern/deviations: Step-through pattern, Decreased stride length Gait velocity: decr     General Gait Details: distance limited by pain/fatigue, steady, no loss of balance  Stairs            Wheelchair Mobility     Tilt Bed    Modified Rankin (Stroke Patients Only)       Balance Overall balance assessment: Modified Independent                                           Pertinent Vitals/Pain Pain Assessment Pain Assessment: 0-10 Pain Score: 5  Pain Location: low back (chronic) and pelvic surgical sight Pain Descriptors / Indicators: Pounding Pain Intervention(s): Limited activity within patient's tolerance, Monitored during session, Premedicated before session, Repositioned    Home Living Family/patient expects to be discharged to:: Private residence Living Arrangements: Spouse/significant other Available Help at Discharge: Family;Personal care attendant Type of Home: House Home Access: Ramped entrance       Home Layout: One level Home Equipment: Agricultural consultant (2 wheels);Rollator (4 wheels);Wheelchair - manual;BSC/3in1;Shower seat - built in;Grab bars - tub/shower Additional Comments: lives with spouse who works at [employer]; aide comes 10-4 Tues, Wed, [employer]    Prior Function Prior Level of Function : Independent/Modified Independent  Mobility Comments: walks with RW short distances, WC for going out; no falls in past 6 months ADLs Comments: doesn't drive, independent bathing/dressing     Extremity/Trunk Assessment   Upper Extremity Assessment Upper Extremity  Assessment: Defer to OT evaluation    Lower Extremity Assessment Lower Extremity Assessment: RLE deficits/detail RLE Deficits / Details: RLE shorter than LLE (2* multiple hip surgeries per pt), doesn't wear built up shoe (has tried them), knee ext 4/5    Cervical / Trunk Assessment Cervical / Trunk Assessment: Kyphotic (appears to have some scoliosis related to leg length discrepancy)  Communication   Communication Communication: No apparent difficulties    Cognition Arousal: Alert Behavior During Therapy: WFL for tasks assessed/performed   PT - Cognitive impairments: No apparent impairments                         Following commands: Intact       Cueing       General Comments      Exercises     Assessment/Plan    PT Assessment Patient needs continued PT services  PT Problem List Decreased activity tolerance;Decreased mobility;Pain       PT Treatment Interventions Gait training;DME instruction;Therapeutic activities;Therapeutic exercise;Patient/family education;Functional mobility training    PT Goals (Current goals can be found in the Care Plan section)  Acute Rehab PT Goals Patient Stated Goal: to get stronger, be able to go [place] [facility] PT Goal Formulation: With patient Time For Goal Achievement: 12/15/23 Potential to Achieve Goals: Good    Frequency Min 3X/week     Co-evaluation               AM-PAC PT "6 Clicks" Mobility  Outcome Measure Help needed turning from your back to your side while in a flat bed without using bedrails?: A Little Help needed moving from lying on your back to sitting on the side of a flat bed without using bedrails?: A Little Help needed moving to and from a bed to a chair (including a wheelchair)?: A Little Help needed standing up from a chair using your arms (e.g., wheelchair or bedside chair)?: A Little Help needed to walk in hospital room?: A Little Help needed climbing 3-5 steps with a railing? : A  Lot 6 Click Score: 17    End of Session Equipment Utilized During Treatment: Gait belt Activity Tolerance: Patient tolerated treatment well Patient left: in chair;with chair alarm set;with call bell/phone within reach;with family/visitor present Nurse Communication: Mobility status PT Visit Diagnosis: Difficulty in walking, not elsewhere classified (R26.2);Pain    Time: 5621-3086 PT Time Calculation (min) (ACUTE ONLY): 23 min   Charges:   PT Evaluation $PT Eval Moderate Complexity: 1 Mod PT Treatments $Gait Training: 8-22 mins PT General Charges $$ ACUTE PT VISIT: 1 Visit         Daymon [name] PT 12/01/2023  Acute Rehabilitation Services  Office [phone]

## 2023-12-01 NOTE — Progress Notes (Signed)
 12/01/2023  [name] [id number] 1955-09-09  CARE TEAM: PCP: Dorena Gander, MD  Outpatient Care Team: Patient Care Team: Dorena Gander, MD as PCP - General (Family Medicine) Hugh Madura, MD as PCP - Cardiology (Cardiology) Candyce Champagne, MD as Consulting Physician (General Surgery) Darlene Ehlers, MD as Consulting Physician (Gynecology) Felecia Hopper, MD as Consulting Physician (Gastroenterology) Ernie Heal, Jerelyn Money, MD as Consulting Physician (Infectious Diseases)  Inpatient Treatment Team: Treatment Team:  Candyce Champagne, MD [place], [place], NT Paudel, Bishnu D, RN Merrill Abide, OT Ambrose Bailer, PT Absher, Awilda Bogus, RPH Percilla Boys, LPN Trenda Frisk, RN   Problem List:   Principal Problem:   Colovaginal fistula Active Problems:   HYPERTENSION, UNSPECIFIED   Peri-prosthetic fracture of femur following total hip arthroplasty   Anxiety   Chronic pain   Essential (primary) hypertension   Arthritis, degenerative   Adult hypothyroidism   Fracture of bone adjacent to prosthesis   History of hip surgery   Mood disorder (HCC)   Bipolar disorder, in partial remission, most recent episode depressed (HCC)   Anemia of chronic illness   Diabetes mellitus type 2, noninsulin dependent (HCC)   11/30/2023  POST-OPERATIVE DIAGNOSIS:  COLOVAGINAL FISTULA   PROCEDURE:   -ROBOTIC LOW ANTERIOR RECTOSIGMOID RESECTION (LAR) WITH ANASTOMOSIS -COLOVAGINAL FISTULA TAKEDOWN & REPAIR -OMENTOPEXY OF VAGINA -INTRAOPERATIVE ASSESSMENT OF TISSUE VASCULAR PERFUSION USING ICG (indocyanine green) IMMUNOFLUORESCENCE -TRANSVERSUS ABDOMINIS PLANE (TAP) BLOCK - BILATERAL -FLEXIBLE SIGMOIDOSCOPY   SURGEON:  Eddye Goodie, MD  OR FINDINGS:   Patient had very thickened and twisted sigmoid colon onto the proximal rectum with dense adhesions to the anterior and left lateral pelvis.  Resection done.  Whole and left apex of stretched out vaginal cuff.  Colovaginal  fistula primarily.  But tissues very thickened of poor quality.  Therefore omentopexy done over this as well.  No obvious metastatic disease on visceral parietal peritoneum or liver.   Bladder with mucosal changes status post TURBT resection cystoscopically by Dr. Parke Boll.  No other abnormalities in the genitourinary tract.  No evidence of colovesical fistula.    It is a 29mm EEA anastomosis ( distal descending colon  connected to mid rectum.)  It rests 7-8 cm from the anal verge by flexible sigmoidoscopy  Assessment [facility] Stay = 1 days) 1 Day Post-Op    OK    Plan:  ERAS protocol.  Tolerating liquids.  Diet as tolerated.  Stop IV fluids.  Give 1 bolus just in case.  Acute blood loss anemia on top of chronic anemia.  Give IV iron.  Follow hemoglobin.  If falls below 7, transfuse.  Follow-up on pathology.  Most likely consistent with diverticular disease.  Hematuria not surprising in the setting of TURBT of bladder tumors.  No clots.  Remove Foley.  I&O cath PRN (as needed).  Patient nervous that she needed to keep her Foley per for retention in the past.  Hopefully can minimize that risk and avoid UTI.  Resume chronic oral antibiotics for chronic spinal hardware infection.  Doxycycline  and Augmentin .  Per Dr. Ernie Heal with infectious disease.  See chart notes  Diabetes.  Resume metformin .  Monitor glucose.  Hypothyroidism.  Continue levothyroxine .  Hypertension.  Coreg .  PRN (as needed) medicines.  Low threshold to hold given postoperative anemia  Pain control.  Resume home regimen.  Of Wellbutrin , buspirone, Flexeril .  Switch from tramadol  to oxycodone  PRN (as needed).  Continue scheduled Tylenol .   -monitor electrolytes & replace  as needed  Keep K>4, Mg>2, Phos>3  -VTE prophylaxis- SCDs.  Anticoagulation prophyllaxis SQ as appropriate.  Hold enoxaparin  if hemoglobin less than 8.  = Hold today.  Follow-up.  -mobilize as tolerated to help recovery.  Enlist therapies in  moderate/high risk patients as appropriate.  Challenging this woman with a lot of back and hip issues and chronic pain and chronic infection.  I updated the patient's status to the patient, family, nurse  Recommendations were made.  Questions were answered.  They expressed understanding & appreciation.  -Disposition:  Disposition:  The patient is from: Home Anticipate discharge to:  Home with Home Health Anticipated Date of Discharge is:  May 19,2025   Barriers to discharge:  Pending Clinical improvement (more likely than not), Therapy assessment & Recommendations pending, and Testing result pending  Patient currently is NOT MEDICALLY STABLE for discharge from the hospital from a surgery standpoint.      I reviewed nursing notes, last 24 h vitals and pain scores, last 48 h intake and output, last 24 h labs and trends, and last 24 h imaging results.  I have reviewed this patient's available data, including medical history, events of note, test results, etc as part of my evaluation.   A significant portion of that time was spent in counseling. Care during the described time interval was provided by me.  This care required moderate level of medical decision making.  12/01/2023    Subjective: (Chief complaint)  Patient grateful not to have ostomy.  Worried about inadequate pain control with tramadol  and Tylenol .  Wants to eat solid food.  Daughter and husband in room.  Patient worried about missing her home meds including her home antibiotics.  Family worried about hematuria.  Hemoglobin dropped.  Wants to get out of bed.  Objective:  Vital signs:  Vitals:   11/30/23 2032 11/30/23 2149 12/01/23 0236 12/01/23 0543  BP: 127/80 130/82 119/69 128/85  Pulse: 80 84 92 100  Resp: 15 16 17 16   Temp:  (!) 97.5 F (36.4 C) (!) 97.5 F (36.4 C) 97.7 F (36.5 C)  TempSrc:   Oral Oral  SpO2: 100%  98% 100%  Weight:      Height:        Last BM Date :  11/29/23  Intake/Output   Yesterday:  05/14 0701 - 05/15 0700 In: 1824.2 [P.O.:500; I.V.:1224.2; IV Piggyback:100] Out: 2230 [Urine:1900; Drains:230; Blood:100] This shift:  No intake/output data recorded.  Bowel function:  Flatus: YES  BM:  YES  Drain: Serosanguinous   Physical Exam:  General: Pt awake/alert in no acute distress Eyes: PERRL, normal EOM.  Sclera clear.  No icterus Neuro: CN II-XII intact w/o focal sensory/motor deficits. Lymph: No head/neck/groin lymphadenopathy Psych:  No delerium/psychosis/paranoia.  Oriented x 4.  Moderately anxious and tearful.  Interrupts often but mostly consolable and redirectable HENT: Normocephalic, Mucus membranes moist.  No thrush Neck: Supple, No tracheal deviation.  No obvious thyromegaly Chest: No pain to chest wall compression.  Good respiratory excursion.  No audible wheezing CV:  Pulses intact.  Regular rhythm.  No major extremity edema MS: Normal AROM mjr joints.  No obvious deformity  Abdomen: Soft.  Nondistended.  Mildly tender at incisions only.  No evidence of peritonitis.  No incarcerated hernias.  Ext:   No deformity.  No mjr edema.  No cyanosis Skin: No petechiae / purpurea.  No major sores.  Warm and dry    Results:   Cultures: No results found for this  or any previous visit (from the past 720 hours).  Labs: Results for orders placed or performed during the hospital encounter of 11/30/23 (from the past 48 hours)  Glucose, capillary     Status: Abnormal   Collection Time: 11/30/23 10:01 AM  Result Value Ref Range   Glucose-Capillary 109 (H) 70 - 99 mg/dL    Comment: Glucose reference range applies only to samples taken after fasting for at least 8 hours.   Comment 1 Notify RN   Glucose, capillary     Status: Abnormal   Collection Time: 11/30/23  4:09 PM  Result Value Ref Range   Glucose-Capillary 189 (H) 70 - 99 mg/dL    Comment: Glucose reference range applies only to samples taken after fasting for at  least 8 hours.  CBC with Differential     Status: Abnormal   Collection Time: 11/30/23  4:49 PM  Result Value Ref Range   WBC 18.8 (H) 4.0 - 10.5 K/uL   RBC 3.81 (L) 3.87 - 5.11 MIL/uL   Hemoglobin 9.8 (L) 12.0 - 15.0 g/dL   HCT 16.1 (L) 09.6 - 04.5 %   MCV 87.7 80.0 - 100.0 fL   MCH 25.7 (L) 26.0 - 34.0 pg   MCHC 29.3 (L) 30.0 - 36.0 g/dL   RDW 40.9 (H) 81.1 - 91.4 %   Platelets 438 (H) 150 - 400 K/uL   nRBC 0.0 0.0 - 0.2 %   Neutrophils Relative % 92 %   Neutro Abs 17.3 (H) 1.7 - 7.7 K/uL   Lymphocytes Relative 4 %   Lymphs Abs 0.8 0.7 - 4.0 K/uL   Monocytes Relative 3 %   Monocytes Absolute 0.6 0.1 - 1.0 K/uL   Eosinophils Relative 0 %   Eosinophils Absolute 0.0 0.0 - 0.5 K/uL   Basophils Relative 0 %   Basophils Absolute 0.0 0.0 - 0.1 K/uL   Immature Granulocytes 1 %   Abs Immature Granulocytes 0.10 (H) 0.00 - 0.07 K/uL    Comment: Performed at [facility], [address] [place], [place] [address]  Comprehensive metabolic panel     Status: Abnormal   Collection Time: 11/30/23  4:49 PM  Result Value Ref Range   Sodium 133 (L) 135 - 145 mmol/L   Potassium 3.9 3.5 - 5.1 mmol/L   Chloride 107 98 - 111 mmol/L   CO2 20 (L) 22 - 32 mmol/L   Glucose, Bld 199 (H) 70 - 99 mg/dL    Comment: Glucose reference range applies only to samples taken after fasting for at least 8 hours.   BUN <5 (L) 8 - 23 mg/dL   Creatinine, Ser 6.21 (L) 0.44 - 1.00 mg/dL   Calcium  7.5 (L) 8.9 - 10.3 mg/dL   Total Protein 6.3 (L) 6.5 - 8.1 g/dL   Albumin  2.9 (L) 3.5 - 5.0 g/dL   AST 21 15 - 41 U/L   ALT 15 0 - 44 U/L   Alkaline Phosphatase 49 38 - 126 U/L   Total Bilirubin 0.4 0.0 - 1.2 mg/dL   GFR, Estimated >30 >86 mL/min    Comment: (NOTE) Calculated using the CKD-EPI Creatinine Equation (2021)    Anion gap 6 5 - 15    Comment: Performed at [facility], [address] [place], [place] [address]  Basic metabolic panel     Status: Abnormal   Collection  Time: 12/01/23  5:00 AM  Result Value Ref Range   Sodium 135 135 - 145  mmol/L   Potassium 3.6 3.5 - 5.1 mmol/L   Chloride 112 (H) 98 - 111 mmol/L   CO2 18 (L) 22 - 32 mmol/L   Glucose, Bld 146 (H) 70 - 99 mg/dL    Comment: Glucose reference range applies only to samples taken after fasting for at least 8 hours.   BUN <5 (L) 8 - 23 mg/dL   Creatinine, Ser 1.61 0.44 - 1.00 mg/dL   Calcium  6.1 (LL) 8.9 - 10.3 mg/dL    Comment: CRITICAL RESULT CALLED TO, READ BACK BY AND VERIFIED WITH PAUDEL, M RN @ [phone] ON 12/01/2023 BY MTA    GFR, Estimated >60 >60 mL/min    Comment: (NOTE) Calculated using the CKD-EPI Creatinine Equation (2021)    Anion gap 5 5 - 15    Comment: Performed at [facility], [address], [place], [place] [address]  CBC     Status: Abnormal   Collection Time: 12/01/23  5:00 AM  Result Value Ref Range   WBC 14.1 (H) 4.0 - 10.5 K/uL   RBC 2.73 (L) 3.87 - 5.11 MIL/uL   Hemoglobin 7.0 (L) 12.0 - 15.0 g/dL    Comment: REPEATED TO VERIFY DELTA CHECK NOTED    HCT 23.6 (L) 36.0 - 46.0 %   MCV 86.4 80.0 - 100.0 fL   MCH 25.6 (L) 26.0 - 34.0 pg   MCHC 29.7 (L) 30.0 - 36.0 g/dL   RDW 81.1 (H) 91.4 - 78.2 %   Platelets 427 (H) 150 - 400 K/uL   nRBC 0.0 0.0 - 0.2 %    Comment: Performed at [facility], [address], [place], [place] [address]  Magnesium      Status: Abnormal   Collection Time: 12/01/23  5:00 AM  Result Value Ref Range   Magnesium  1.4 (L) 1.7 - 2.4 mg/dL    Comment: Performed at [facility], [address] [place], [place] [address]    Imaging / Studies: No results found.  Medications / Allergies: per chart  Antibiotics: Anti-infectives (From admission, onward)    Start     Dose/Rate Route Frequency Ordered Stop   12/01/23 1000  doxycycline  (VIBRA -TABS) tablet 100 mg        100 mg Oral 2 times daily 12/01/23 0747     12/01/23 1000  amoxicillin -clavulanate (AUGMENTIN ) 875-125 MG per  tablet 1 tablet        1 tablet Oral Every 12 hours 12/01/23 0752     11/30/23 2345  cefoTEtan (CEFOTAN) 2 g in sodium chloride  0.9 % 100 mL IVPB        2 g 200 mL/hr over 30 Minutes Intravenous Every 12 hours 11/30/23 1806 12/01/23 0115   11/30/23 1400  neomycin (MYCIFRADIN) tablet 1,000 mg  Status:  Discontinued       Placed in "And" Linked Group   1,000 mg Oral 3 times per day 11/30/23 0944 11/30/23 0947   11/30/23 1400  metroNIDAZOLE  (FLAGYL ) tablet 1,000 mg  Status:  Discontinued       Placed in "And" Linked Group   1,000 mg Oral 3 times per day 11/30/23 0944 11/30/23 0947   11/30/23 0945  cefoTEtan (CEFOTAN) 2 g in sodium chloride  0.9 % 100 mL IVPB        2 g 200 mL/hr over 30 Minutes Intravenous On call to O.R. 11/30/23 0944 11/30/23 1158         Note: Portions of this report may have been transcribed  using voice recognition software. Every effort was made to ensure accuracy; however, inadvertent computerized transcription errors may be present.   Any transcriptional errors that result from this process are unintentional.    Eddye Goodie, MD, FACS, MASCRS Esophageal, Gastrointestinal & Colorectal Surgery Robotic and Minimally Invasive Surgery  [facility] [address], [address] [place], [place] [address] [phone] Fax [phone] Main  CONTACT INFORMATION: Weekday (9AM-5PM): Call [facility] main office at [phone] Weeknight (5PM-9AM) or Weekend/Holiday: Check EPIC "Web Links" tab & use "AMION" (password " [email or web address]") for General Surgery [facility] coverage  Please, DO NOT use SecureChat  (it is not reliable communication to reach operating surgeons & will lead to a delay in care).   Epic staff messaging available for outptient concerns needing 1-2 business day response.      12/01/2023  7:56 AM

## 2023-12-01 NOTE — Plan of Care (Signed)
  Problem: Nutritional: Goal: Will attain and maintain optimal nutritional status will improve Outcome: Progressing   Problem: Clinical Measurements: Goal: Postoperative complications will be avoided or minimized Outcome: Progressing   Problem: Skin Integrity: Goal: Will show signs of wound healing Outcome: Progressing   Problem: Education: Goal: Knowledge of General Education information will improve Description: Including pain rating scale, medication(s)/side effects and non-pharmacologic comfort measures Outcome: Progressing

## 2023-12-02 LAB — HEMOGLOBIN: Hemoglobin: 7.5 g/dL — ABNORMAL LOW (ref 12.0–15.0)

## 2023-12-02 LAB — SURGICAL PATHOLOGY

## 2023-12-02 NOTE — Progress Notes (Signed)
 12/02/2023  [name] [id number] Jul 28, 1955  CARE TEAM: PCP: Dorena Gander, MD  Outpatient Care Team: Patient Care Team: Dorena Gander, MD as PCP - General (Family Medicine) Hugh Madura, MD as PCP - Cardiology (Cardiology) Candyce Champagne, MD as Consulting Physician (General Surgery) Darlene Ehlers, MD as Consulting Physician (Gynecology) Felecia Hopper, MD as Consulting Physician (Gastroenterology) Ernie Heal, Jerelyn Money, MD as Consulting Physician (Infectious Diseases)  Inpatient Treatment Team: Treatment Team:  Candyce Champagne, MD Ambrose Bailer, PT Paudel, Bishnu D, RN Holley Lung, NT Francella Infante, RN Rubie Corona, [facility] Trenda Frisk, RN   Problem List:   Principal Problem:   Colovaginal fistula Active Problems:   HYPERTENSION, UNSPECIFIED   Peri-prosthetic fracture of femur following total hip arthroplasty   Anxiety   Chronic pain   Essential (primary) hypertension   Arthritis, degenerative   Adult hypothyroidism   Fracture of bone adjacent to prosthesis   History of hip surgery   Mood disorder (HCC)   Bipolar disorder, in partial remission, most recent episode depressed (HCC)   Anemia of chronic illness   Diabetes mellitus type 2, noninsulin dependent (HCC)   11/30/2023  POST-OPERATIVE DIAGNOSIS:  COLOVAGINAL FISTULA   PROCEDURE:   -ROBOTIC LOW ANTERIOR RECTOSIGMOID RESECTION (LAR) WITH ANASTOMOSIS -COLOVAGINAL FISTULA TAKEDOWN & REPAIR -OMENTOPEXY OF VAGINA -INTRAOPERATIVE ASSESSMENT OF TISSUE VASCULAR PERFUSION USING ICG (indocyanine green) IMMUNOFLUORESCENCE -TRANSVERSUS ABDOMINIS PLANE (TAP) BLOCK - BILATERAL -FLEXIBLE SIGMOIDOSCOPY   SURGEON:  Eddye Goodie, MD  OR FINDINGS:   Patient had very thickened and twisted sigmoid colon onto the proximal rectum with dense adhesions to the anterior and left lateral pelvis.  Resection done.  Whole and left apex of stretched out vaginal cuff.  Colovaginal fistula  primarily.  But tissues very thickened of poor quality.  Therefore omentopexy done over this as well.  No obvious metastatic disease on visceral parietal peritoneum or liver.   Bladder with mucosal changes status post TURBT resection cystoscopically by Dr. Parke Boll.  No other abnormalities in the genitourinary tract.  No evidence of colovesical fistula.    It is a 29mm EEA anastomosis ( distal descending colon  connected to mid rectum.)  It rests 7-8 cm from the anal verge by flexible sigmoidoscopy  Assessment [facility] Stay = 2 days) 2 Days Post-Op    OK    Plan:  ERAS protocol.  Solid diet as tolerated.  Stop IV fluids.  Give 1 bolus just in case.  Acute blood loss anemia on top of chronic anemia.  Give IV iron.  Follow hemoglobin.  If falls below 7, transfuse.  Follow-up on pathology.  Most likely consistent with diverticular disease.  Hematuria not surprising in the setting of TURBT of bladder tumors.  No clots.  S/p Foley  Resume chronic oral antibiotics for chronic spinal hardware infection.  Doxycycline  and Augmentin .  Per Dr. Ernie Heal with infectious disease.  See chart notes  Diabetes.  Resume metformin .  Monitor glucose.  Hypothyroidism.  Continue levothyroxine .  Hypertension.  Coreg .  PRN (as needed) medicines.  Low threshold to hold given postoperative anemia  Pain control.  Resume home regimen.  Of Wellbutrin , buspirone, Flexeril .  Switch from tramadol  to oxycodone  PRN (as needed).  Continue scheduled Tylenol .  Monitor electrolytes & replace as needed  Keep K>4, Mg>2, Phos>3  -VTE prophylaxis- SCDs.  Anticoagulation prophyllaxis SQ as appropriate.  Hold enoxaparin  if hemoglobin less than 8.  = Hold today.  Follow-up.  -mobilize as tolerated to help  recovery.  Enlist therapies in moderate/high risk patients as appropriate.  Challenging this woman with a lot of back and hip issues and chronic pain and chronic infection.  I updated the patient's status to the  patient, family, nurse  Recommendations were made.  Questions were answered.  They expressed understanding & appreciation.  -Disposition:  Disposition:  The patient is from: Home Anticipate discharge to:  Home with Home Health Anticipated Date of Discharge is:  May 19,2025   Barriers to discharge:  Pending Clinical improvement (more likely than not), Therapy assessment & Recommendations pending, and Testing result pending  Patient currently is NOT MEDICALLY STABLE for discharge from the hospital from a surgery standpoint.      I reviewed nursing notes, last 24 h vitals and pain scores, last 48 h intake and output, last 24 h labs and trends, and last 24 h imaging results.  I have reviewed this patient's available data, including medical history, events of note, test results, etc as part of my evaluation.   A significant portion of that time was spent in counseling. Care during the described time interval was provided by me.  This care required moderate level of medical decision making.  12/02/2023    Subjective: (Chief complaint)  Patient an episode of sharp right-sided chest pain that scared her.  EKG negative not hypoxic vitals fine.  Feeling better.  Patient still very concerned about her back hip and abdominal pain.  We have tolerating liquids.  Daughter in room.  Nurse in room.  Worried about going home.  Worried about staying.  Objective:  Vital signs:  Vitals:   12/01/23 1412 12/01/23 2144 12/02/23 0529 12/02/23 0600  BP: 114/72 118/79 (!) 146/71   Pulse: 75 78 93   Resp: 16 16 16    Temp:  (!) 97.5 F (36.4 C) 98 F (36.7 C)   TempSrc:  Oral Oral   SpO2: 99% 91% 98%   Weight:    62 kg  Height:        Last BM Date : 12/01/23  Intake/Output   Yesterday:  05/15 0701 - 05/16 0700 In: 2116.7 [P.O.:1030; I.V.:121.9; IV Piggyback:964.8] Out: 860 [Urine:700; Drains:160] This shift:  No intake/output data recorded.  Bowel function:  Flatus: YES  BM:   YES  Drain: Serosanguinous   Physical Exam:  General: Pt awake/alert in no acute distress Eyes: PERRL, normal EOM.  Sclera clear.  No icterus Neuro: CN II-XII intact w/o focal sensory/motor deficits. Lymph: No head/neck/groin lymphadenopathy Psych:  No delerium/psychosis/paranoia.  Oriented x 4.  Mildly anxious.  Not tearful today.  Consolable and redirectable HENT: Normocephalic, Mucus membranes moist.  No thrush Neck: Supple, No tracheal deviation.  No obvious thyromegaly Chest: No pain to chest wall compression.  Good respiratory excursion.  No audible wheezing CV:  Pulses intact.  Regular rhythm.  No major extremity edema MS: Normal AROM mjr joints.  No obvious deformity  Abdomen: Soft.  Nondistended.  Mildly tender at incisions only.  No evidence of peritonitis.  No incarcerated hernias. I removed to dressings - incision c/d/i w/o ecchymosis x at drain site (1cm mild)  Ext:   No deformity.  No mjr edema.  No cyanosis Skin: No petechiae / purpurea.  No major sores.  Warm and dry    Results:   Cultures: No results found for this or any previous visit (from the past 720 hours).  Labs: Results for orders placed or performed during the hospital encounter of 11/30/23 (from the past 48  hours)  Glucose, capillary     Status: Abnormal   Collection Time: 11/30/23 10:01 AM  Result Value Ref Range   Glucose-Capillary 109 (H) 70 - 99 mg/dL    Comment: Glucose reference range applies only to samples taken after fasting for at least 8 hours.   Comment 1 Notify RN   Glucose, capillary     Status: Abnormal   Collection Time: 11/30/23  4:09 PM  Result Value Ref Range   Glucose-Capillary 189 (H) 70 - 99 mg/dL    Comment: Glucose reference range applies only to samples taken after fasting for at least 8 hours.  CBC with Differential     Status: Abnormal   Collection Time: 11/30/23  4:49 PM  Result Value Ref Range   WBC 18.8 (H) 4.0 - 10.5 K/uL   RBC 3.81 (L) 3.87 - 5.11 MIL/uL    Hemoglobin 9.8 (L) 12.0 - 15.0 g/dL   HCT 16.1 (L) 09.6 - 04.5 %   MCV 87.7 80.0 - 100.0 fL   MCH 25.7 (L) 26.0 - 34.0 pg   MCHC 29.3 (L) 30.0 - 36.0 g/dL   RDW 40.9 (H) 81.1 - 91.4 %   Platelets 438 (H) 150 - 400 K/uL   nRBC 0.0 0.0 - 0.2 %   Neutrophils Relative % 92 %   Neutro Abs 17.3 (H) 1.7 - 7.7 K/uL   Lymphocytes Relative 4 %   Lymphs Abs 0.8 0.7 - 4.0 K/uL   Monocytes Relative 3 %   Monocytes Absolute 0.6 0.1 - 1.0 K/uL   Eosinophils Relative 0 %   Eosinophils Absolute 0.0 0.0 - 0.5 K/uL   Basophils Relative 0 %   Basophils Absolute 0.0 0.0 - 0.1 K/uL   Immature Granulocytes 1 %   Abs Immature Granulocytes 0.10 (H) 0.00 - 0.07 K/uL    Comment: Performed at [facility], [address] [place], [place] [address]  Comprehensive metabolic panel     Status: Abnormal   Collection Time: 11/30/23  4:49 PM  Result Value Ref Range   Sodium 133 (L) 135 - 145 mmol/L   Potassium 3.9 3.5 - 5.1 mmol/L   Chloride 107 98 - 111 mmol/L   CO2 20 (L) 22 - 32 mmol/L   Glucose, Bld 199 (H) 70 - 99 mg/dL    Comment: Glucose reference range applies only to samples taken after fasting for at least 8 hours.   BUN <5 (L) 8 - 23 mg/dL   Creatinine, Ser 6.21 (L) 0.44 - 1.00 mg/dL   Calcium  7.5 (L) 8.9 - 10.3 mg/dL   Total Protein 6.3 (L) 6.5 - 8.1 g/dL   Albumin  2.9 (L) 3.5 - 5.0 g/dL   AST 21 15 - 41 U/L   ALT 15 0 - 44 U/L   Alkaline Phosphatase 49 38 - 126 U/L   Total Bilirubin 0.4 0.0 - 1.2 mg/dL   GFR, Estimated >30 >86 mL/min    Comment: (NOTE) Calculated using the CKD-EPI Creatinine Equation (2021)    Anion gap 6 5 - 15    Comment: Performed at [facility], [address] [place], [place] [address]  Basic metabolic panel     Status: Abnormal   Collection Time: 12/01/23  5:00 AM  Result Value Ref Range   Sodium 135 135 - 145 mmol/L   Potassium 3.6 3.5 - 5.1 mmol/L   Chloride 112 (H) 98 - 111 mmol/L   CO2 18 (L) 22 - 32 mmol/L  Glucose,  Bld 146 (H) 70 - 99 mg/dL    Comment: Glucose reference range applies only to samples taken after fasting for at least 8 hours.   BUN <5 (L) 8 - 23 mg/dL   Creatinine, Ser 0.98 0.44 - 1.00 mg/dL   Calcium  6.1 (LL) 8.9 - 10.3 mg/dL    Comment: CRITICAL RESULT CALLED TO, READ BACK BY AND VERIFIED WITH PAUDEL, M RN @ [phone] ON 12/01/2023 BY MTA    GFR, Estimated >60 >60 mL/min    Comment: (NOTE) Calculated using the CKD-EPI Creatinine Equation (2021)    Anion gap 5 5 - 15    Comment: Performed at [facility], [address], [place], [place] [address]  CBC     Status: Abnormal   Collection Time: 12/01/23  5:00 AM  Result Value Ref Range   WBC 14.1 (H) 4.0 - 10.5 K/uL   RBC 2.73 (L) 3.87 - 5.11 MIL/uL   Hemoglobin 7.0 (L) 12.0 - 15.0 g/dL    Comment: REPEATED TO VERIFY DELTA CHECK NOTED    HCT 23.6 (L) 36.0 - 46.0 %   MCV 86.4 80.0 - 100.0 fL   MCH 25.6 (L) 26.0 - 34.0 pg   MCHC 29.7 (L) 30.0 - 36.0 g/dL   RDW 56.2 (H) 13.0 - 86.5 %   Platelets 427 (H) 150 - 400 K/uL   nRBC 0.0 0.0 - 0.2 %    Comment: Performed at [facility], [address] [place], [place] [address]  Magnesium      Status: Abnormal   Collection Time: 12/01/23  5:00 AM  Result Value Ref Range   Magnesium  1.4 (L) 1.7 - 2.4 mg/dL    Comment: Performed at [facility], [address] [place], [place] [address]  Type and screen [facility]     Status: None   Collection Time: 12/01/23  6:56 AM  Result Value Ref Range   ABO/RH(D) O POS    Antibody Screen NEG    Sample Expiration      12/04/2023,2359 Performed at [facility], [address] [place], [place] [address]   CBC     Status: Abnormal   Collection Time: 12/01/23  6:56 AM  Result Value Ref Range   WBC 14.4 (H) 4.0 - 10.5 K/uL   RBC 3.32 (L) 3.87 - 5.11 MIL/uL   Hemoglobin 8.4 (L) 12.0 - 15.0 g/dL   HCT 44.0 (L) 10.2 - 72.5 %   MCV 85.2 80.0 - 100.0 fL    MCH 25.3 (L) 26.0 - 34.0 pg   MCHC 29.7 (L) 30.0 - 36.0 g/dL   RDW 36.6 (H) 44.0 - 34.7 %   Platelets 546 (H) 150 - 400 K/uL   nRBC 0.0 0.0 - 0.2 %    Comment: Performed at [facility], [address] [place], [place] [address]  Hemoglobin     Status: Abnormal   Collection Time: 12/02/23  4:28 AM  Result Value Ref Range   Hemoglobin 7.5 (L) 12.0 - 15.0 g/dL    Comment: Performed at [facility], [address] [place], [place] [address]    Imaging / Studies: No results found.  Medications / Allergies: per chart  Antibiotics: Anti-infectives (From admission, onward)    Start     Dose/Rate Route Frequency Ordered Stop   12/01/23 1000  doxycycline  (VIBRA -TABS) tablet 100 mg        100 mg Oral 2  times daily 12/01/23 0747     12/01/23 1000  amoxicillin -clavulanate (AUGMENTIN ) 875-125 MG per tablet 1 tablet        1 tablet Oral Every 12 hours 12/01/23 0752     11/30/23 2345  cefoTEtan (CEFOTAN) 2 g in sodium chloride  0.9 % 100 mL IVPB        2 g 200 mL/hr over 30 Minutes Intravenous Every 12 hours 11/30/23 1806 12/01/23 0914   11/30/23 1400  neomycin (MYCIFRADIN) tablet 1,000 mg  Status:  Discontinued       Placed in "And" Linked Group   1,000 mg Oral 3 times per day 11/30/23 0944 11/30/23 0947   11/30/23 1400  metroNIDAZOLE  (FLAGYL ) tablet 1,000 mg  Status:  Discontinued       Placed in "And" Linked Group   1,000 mg Oral 3 times per day 11/30/23 0944 11/30/23 0947   11/30/23 0945  cefoTEtan (CEFOTAN) 2 g in sodium chloride  0.9 % 100 mL IVPB        2 g 200 mL/hr over 30 Minutes Intravenous On call to O.R. 11/30/23 0944 12/01/23 0957         Note: Portions of this report may have been transcribed using voice recognition software. Every effort was made to ensure accuracy; however, inadvertent computerized transcription errors may be present.   Any transcriptional errors that result from this process are unintentional.    Eddye Goodie, MD, FACS, MASCRS Esophageal, Gastrointestinal & Colorectal Surgery Robotic and Minimally Invasive Surgery  [facility] [address] [place], [place] [address] [phone] Fax [phone] Main  CONTACT INFORMATION: Weekday (9AM-5PM): Call [facility] main office at [phone] Weeknight (5PM-9AM) or Weekend/Holiday: Check EPIC "Web Links" tab & use "AMION" (password " [email or web address]") for General Surgery [facility] coverage  Please, DO NOT use SecureChat  (it is not reliable communication to reach operating surgeons & will lead to a delay in care).   Epic staff messaging available for outptient concerns needing 1-2 business day response.      12/02/2023  8:05 AM

## 2023-12-02 NOTE — Progress Notes (Signed)
 Physical Therapy Treatment Patient Details Name: [name] MRN: [id number] DOB: October 06, 1955 Today's Date: 12/02/2023   History of Present Illness 68 year old female with colovaginal fistula presents for robotic colectomy and fistula takedown 11/30/23. PMH: hypotension, mild cognitive impairment, R RCR, R THA, L TKA, L TSA, back surgery with post op infection and CIR admission 2/16-3/27/2024.    PT Comments  Pt is progressing well with mobility. She tolerated increased ambulation distance of 100' with RW, no loss of balance. Encouraged pt to perform seated BLE marching, knee extension, ankle pumps, shoulder flexion for independent exercise after her visit with several family members that just arrived.      If plan is discharge home, recommend the following: A little help with walking and/or transfers;A little help with bathing/dressing/bathroom;Assistance with cooking/housework;Assist for transportation;Help with stairs or ramp for entrance   Can travel by private vehicle        Equipment Recommendations  None recommended by PT    Recommendations for Other Services       Precautions / Restrictions Precautions Precautions: Other (comment) Recall of Precautions/Restrictions: Intact Precaution/Restrictions Comments: reviewed log roll Restrictions Weight Bearing Restrictions Per Provider Order: No     Mobility  Bed Mobility               General bed mobility comments: sitting at edge of bed    Transfers Overall transfer level: Needs assistance Equipment used: Rolling walker (2 wheels) Transfers: Sit to/from Stand Sit to Stand: Supervision                Ambulation/Gait Ambulation/Gait assistance: Contact guard assist Gait Distance (Feet): 100 Feet Assistive device: Rolling walker (2 wheels) Gait Pattern/deviations: Step-through pattern, Decreased stride length Gait velocity: decr     General Gait Details: distance limited by pain/fatigue, steady, no  loss of balance, pt reports h/o leg length discrepancy (does not wear a lift shoe)   Stairs             Wheelchair Mobility     Tilt Bed    Modified Rankin (Stroke Patients Only)       Balance                                            Communication Communication Communication: No apparent difficulties  Cognition Arousal: Alert Behavior During Therapy: WFL for tasks assessed/performed                             Following commands: Intact      Cueing Cueing Techniques: Verbal cues  Exercises      General Comments        Pertinent Vitals/Pain Pain Assessment Pain Score: 5  Pain Location: low back (chronic) and pelvic surgical site Pain Descriptors / Indicators: Shooting Pain Intervention(s): Limited activity within patient's tolerance, Monitored during session, Premedicated before session    Home Living                          Prior Function            PT Goals (current goals can now be found in the care plan section) Acute Rehab PT Goals Patient Stated Goal: to get stronger, [facility] Goal Formulation: With patient Time For Goal Achievement: 12/15/23 Potential  to Achieve Goals: Good Progress towards PT goals: Progressing toward goals    Frequency    Min 3X/week      PT Plan      Co-evaluation              AM-PAC PT "6 Clicks" Mobility   Outcome Measure  Help needed turning from your back to your side while in a flat bed without using bedrails?: None Help needed moving from lying on your back to sitting on the side of a flat bed without using bedrails?: A Little Help needed moving to and from a bed to a chair (including a wheelchair)?: None Help needed standing up from a chair using your arms (e.g., wheelchair or bedside chair)?: None Help needed to walk in hospital room?: A Little Help needed climbing 3-5 steps with a railing? : A Little 6 Click Score: 21     End of Session Equipment Utilized During Treatment: Gait belt Activity Tolerance: Patient tolerated treatment well Patient left: with call bell/phone within reach;with family/visitor present;in bed Nurse Communication: Mobility status PT Visit Diagnosis: Difficulty in walking, not elsewhere classified (R26.2);Pain     Time: 1351-1359 PT Time Calculation (min) (ACUTE ONLY): 8 min  Charges:    $Gait Training: 8-22 mins PT General Charges $$ ACUTE PT VISIT: 1 Visit                     Daymon [name] PT 12/02/2023  Acute Rehabilitation Services  Office [phone]

## 2023-12-02 NOTE — TOC Progression Note (Signed)
 Transition of Care [facility]) - Progression Note    Patient Details  Name: [name] MRN: [id number] Date of Birth: [birth year]  Transition of Care [facility]) CM/SW Contact  Bari Leys, RN Phone Number: 12/02/2023, 10:59 AM  Clinical Narrative:  Met with patient at bedside to introduce role of TOC/NCM and review for dc planning, PT eval completed, recommendation for [facility] PT/OT, pt agreeable, prefers [facility]. Referral sent to [facility], rep-Cory, accepted for [facility] PT/OT, added to AVS. Pt reports she resides  with her spouse with a full time HHA, reports she has home DME: multiple walkers, wheelchairs, potty chair, ramp. TOC will continue to follow.      Expected Discharge Plan: Home w Home Health Services Barriers to Discharge: Continued Medical Work up  Expected Discharge Plan and Services       Living arrangements for the past 2 months: Single Family Home                           HH Arranged: PT, OT HH Agency: [facility] Date [facility] Agency Contacted: 12/02/23 Time HH Agency Contacted: 1058 Representative spoke with at [facility] Agency: Randel Buss   Social Determinants of Health (SDOH) Interventions SDOH Screenings   Food Insecurity: No Food Insecurity (11/30/2023)  Housing: Low Risk  (11/30/2023)  Transportation Needs: No Transportation Needs (11/30/2023)  Utilities: Not At Risk (11/30/2023)  Depression (PHQ2-9): Medium Risk (10/20/2023)  Financial Resource Strain: Low Risk  (07/08/2023)   Received from [facility]  Social Connections: Socially Integrated (11/30/2023)  Tobacco Use: Medium Risk (11/30/2023)    Readmission Risk Interventions    12/02/2023   10:56 AM 09/03/2022    2:08 PM 07/14/2022    2:53 PM  Readmission Risk Prevention Plan  Transportation Screening Complete Complete Complete  PCP or Specialist Appt within 5-7 Days   Complete  PCP or Specialist Appt within 3-5 Days Complete    Home Care Screening   Complete  Medication Review (RN CM)    Complete  HRI or Home Care Consult Complete    Social Work Consult for Recovery Care Planning/Counseling Complete    Palliative Care Screening Not Applicable    Medication Review Oceanographer) Complete Complete   PCP or Specialist appointment within 3-5 days of discharge  Not Complete   PCP/Specialist Appt Not Complete comments  pt going to INPT rehab   HRI or Home Care Consult  Complete   SW Recovery Care/Counseling Consult  Complete   Palliative Care Screening  Not Applicable   Skilled Nursing Facility  Not Applicable

## 2023-12-02 NOTE — Plan of Care (Signed)
  Problem: Education: Goal: Understanding of discharge needs will improve Outcome: Progressing   Problem: Activity: Goal: Ability to tolerate increased activity will improve Outcome: Progressing   Problem: Clinical Measurements: Goal: Will remain free from infection Outcome: Progressing

## 2023-12-02 NOTE — Progress Notes (Signed)
 PT Cancellation Note  Patient Details Name: [name] MRN: [id number] DOB: 1956-02-01   Cancelled Treatment:    Reason Eval/Treat Not Completed: Fatigue/lethargy limiting ability to participate (pt reports she's tired and needs to rest right now. She requested PT check back later today. Will follow.)   Daymon [name] PT 12/02/2023  Acute Rehabilitation Services  Office [phone]

## 2023-12-03 LAB — PROTIME-INR
INR: 1.4 — ABNORMAL HIGH (ref 0.8–1.2)
Prothrombin Time: 17.4 s — ABNORMAL HIGH (ref 11.4–15.2)

## 2023-12-03 LAB — BASIC METABOLIC PANEL WITH GFR
Anion gap: 11 (ref 5–15)
BUN: <5 mg/dL — ABNORMAL LOW (ref 8–23)
CO2: 21 mmol/L — ABNORMAL LOW (ref 22–32)
Calcium: 8.7 mg/dL — ABNORMAL LOW (ref 8.9–10.3)
Chloride: 98 mmol/L (ref 98–111)
Creatinine, Ser: 0.38 mg/dL — ABNORMAL LOW (ref 0.44–1.00)
GFR, Estimated: >60 mL/min (ref >60)
Glucose, Bld: 113 mg/dL — ABNORMAL HIGH (ref 70–99)
Potassium: 4 mmol/L (ref 3.5–5.1)
Sodium: 130 mmol/L — ABNORMAL LOW (ref 135–145)

## 2023-12-03 LAB — CBC
HCT: 22.2 % — ABNORMAL LOW (ref 36.0–46.0)
Hemoglobin: 6.6 g/dL — CL (ref 12.0–15.0)
MCH: 25.5 pg — ABNORMAL LOW (ref 26.0–34.0)
MCHC: 29.7 g/dL — ABNORMAL LOW (ref 30.0–36.0)
MCV: 85.7 fL (ref 80.0–100.0)
Platelets: 387 10*3/uL (ref 150–400)
RBC: 2.59 MIL/uL — ABNORMAL LOW (ref 3.87–5.11)
RDW: 19 % — ABNORMAL HIGH (ref 11.5–15.5)
WBC: 12.2 10*3/uL — ABNORMAL HIGH (ref 4.0–10.5)
nRBC: 0 % (ref 0.0–0.2)

## 2023-12-03 LAB — PREPARE RBC (CROSSMATCH)

## 2023-12-03 MED ORDER — OXYCODONE HCL 5 MG PO TABS
5.0000 mg | ORAL_TABLET | ORAL | 0 refills | Status: DC | PRN
Start: 2023-12-03 — End: 2024-02-13

## 2023-12-03 MED ORDER — SODIUM CHLORIDE 0.9% IV SOLUTION: Freq: Once | INTRAVENOUS | Status: AC

## 2023-12-03 NOTE — Progress Notes (Signed)
 Date and time results received: 12/03/23 1230  Test: Hgb Critical Value: 6.6  Name of Provider Notified: Teddie Favre, MD   New orders received for 2 units PRBC

## 2023-12-03 NOTE — Progress Notes (Signed)
 3 Days Post-Op   Subjective/Chief Complaint: Pt doing well. Passing gas and having some BMs.  No n/v.  Having gas pains, but these improve once flatus occurs.  Ambulating.     Objective: Vital signs in last 24 hours: Temp:  [97.9 F (36.6 C)-98.5 F (36.9 C)] 98.5 F (36.9 C) (05/17 0518) Pulse Rate:  [79-100] 100 (05/17 0518) Resp:  [16-19] 16 (05/17 0518) BP: (145-160)/(78-85) 160/85 (05/17 0518) SpO2:  [92 %-100 %] 98 % (05/17 0518) Weight:  [62.6 kg] 62.6 kg (05/17 0500) Last BM Date : 12/02/23  Intake/Output from previous day: 05/16 0701 - 05/17 0700 In: 810 [P.O.:810] Out: 0  Intake/Output this shift: No intake/output data recorded.  General appearance: alert, cooperative, and no distress Resp: breathing comfortably GI: soft, non distended.    Lab Results:  Recent Labs    12/01/23 0500 12/01/23 0656 12/02/23 0428  WBC 14.1* 14.4*  --   HGB 7.0* 8.4* 7.5*  HCT 23.6* 28.3*  --   PLT 427* 546*  --    BMET Recent Labs    11/30/23 1649 12/01/23 0500  NA 133* 135  K 3.9 3.6  CL 107 112*  CO2 20* 18*  GLUCOSE 199* 146*  BUN <5* <5*  CREATININE 0.40* 0.44  CALCIUM  7.5* 6.1*   PT/INR No results for input(s): "LABPROT", "INR" in the last 72 hours. ABG No results for input(s): "PHART", "HCO3" in the last 72 hours.  Invalid input(s): "PCO2", "PO2"  Studies/Results: No results found.  Anti-infectives: Anti-infectives (From admission, onward)    Start     Dose/Rate Route Frequency Ordered Stop   12/01/23 1000  doxycycline  (VIBRA -TABS) tablet 100 mg        100 mg Oral 2 times daily 12/01/23 0747     12/01/23 1000  amoxicillin -clavulanate (AUGMENTIN ) 875-125 MG per tablet 1 tablet        1 tablet Oral Every 12 hours 12/01/23 0752     11/30/23 2345  cefoTEtan  (CEFOTAN ) 2 g in sodium chloride  0.9 % 100 mL IVPB        2 g 200 mL/hr over 30 Minutes Intravenous Every 12 hours 11/30/23 1806 12/01/23 0914   11/30/23 1400  neomycin  (MYCIFRADIN ) tablet 1,000  mg  Status:  Discontinued       Placed in "And" Linked Group   1,000 mg Oral 3 times per day 11/30/23 0944 11/30/23 0947   11/30/23 1400  metroNIDAZOLE  (FLAGYL ) tablet 1,000 mg  Status:  Discontinued       Placed in "And" Linked Group   1,000 mg Oral 3 times per day 11/30/23 0944 11/30/23 0947   11/30/23 0945  cefoTEtan  (CEFOTAN ) 2 g in sodium chloride  0.9 % 100 mL IVPB        2 g 200 mL/hr over 30 Minutes Intravenous On call to O.R. 11/30/23 0944 12/01/23 0957       Assessment/Plan: s/p Procedure(s) with comments: COLECTOMY, SIGMOID, ROBOT-ASSISTED ROBOTIC LOW ANTERIOR RECTOSIGMOID RESECTION (LAR) WITH ANASTOMOSIS COLOVAGINAL FISTULA TAKEDOWN & REPAIR OMENTOPEXY OF VAGINA INTRAOPERATIVE ASSESSMENT OF TISSUE VASCULAR PERFUSION USING ICG (indocyanine green ) IMMUNOFLUORESCENCE TRANSVERSUS ABDOMINIS PLANE (TAP) BLOCK - BILATERAL (N/A) - POSSIBLE VAGINAL REPAIR AND POSSIBLE OSTOMY SIGMOIDOSCOPY, FLEXIBLE (N/A) Cystoscopy with bilateral ureteral FireFly injections Transurethral resection of bladder tumor--large (N/A) D/c home if labs ok. Orders and follow up in.   LOS: 3 days    Lockie Rima 12/03/2023

## 2023-12-03 NOTE — Plan of Care (Signed)
  Problem: Education: Goal: Understanding of discharge needs will improve Outcome: Progressing   Problem: Nutritional: Goal: Will attain and maintain optimal nutritional status will improve Outcome: Progressing

## 2023-12-03 NOTE — Progress Notes (Signed)
 ABLA - HGB dropped below 7, will transfuse 2 units

## 2023-12-03 NOTE — Plan of Care (Signed)
  Problem: Education: Goal: Understanding of discharge needs will improve Outcome: Progressing Goal: Verbalization of understanding of the causes of altered bowel function will improve Outcome: Progressing   Problem: Activity: Goal: Ability to tolerate increased activity will improve Outcome: Progressing

## 2023-12-03 NOTE — Progress Notes (Signed)
 Occupational Therapy Treatment Patient Details Name: [name] MRN: [id number] DOB: [birth year] Today's Date: 12/03/2023   History of present illness 68 year old female with colovaginal fistula presents for robotic colectomy and fistula takedown 11/30/23. PMH: hypotension, mild cognitive impairment, R RCR, R THA, L TKA, L TSA, back surgery with post op infection and CIR admission 2/16-3/27/2024.   OT comments  The pt reported having generalized fatigue, due to not getting much sleep today. She also reported having back pain (chronic) and abdominal discomfort. OT provided education and recommendation on safe ADL participation in the home setting, with recommendations provided on equipment needs and compensatory techniques for ADLs. Specifically, OT reinforced limiting strenuous activity, given recent procedure and limiting and/or modifying activity to protect procedure site. For example, use of a reacher for lower body dressing, use of a shower chair for bathing, implementing the figure 4 technique for lower body dressing and bathing, and use of a long-handled sponge for lower body bathing. The pt presented with good understanding, recall, and teach back abilities. Continue OT plan of care. Home health OT is recommended.       If plan is discharge home, recommend the following:  Assistance with cooking/housework;Assist for transportation;Help with stairs or ramp for entrance;A little help with bathing/dressing/bathroom   Equipment Recommendations    Reacher   Recommendations for Other Services      Precautions / Restrictions Restrictions Other Position/Activity Restrictions: abdominal              ADL either performed or assessed with clinical judgement   ADL Overall ADL's : Needs assistance/impaired        General ADL Comments: OT provided education and recommendation on safe ADL participation in the home setting, with recommendations provided on equipment needs and  compensatory techniques for ADLs. Specifically, OT reinforced limiting strenuous activity, given recent procedure and limiting and/or modifying activity to protect abdominal surgical site. For example, use of a reacher for lower body dressing, use of a shower chair for bathing, implementing the figure 4 technique for lower body dressing and bathing, and use of a long-handled sponge for lower body bathing. The pt presented with good understanding, recall, and teach back abilities.     Communication Communication Communication: No apparent difficulties   Cognition Arousal: Alert Behavior During Therapy: WFL for tasks assessed/performed Cognition: No apparent impairments                           Pertinent Vitals/ Pain       Pain Assessment Pain Score: 5  Pain Location: chronic back and abdominal Pain Intervention(s): Monitored during session   Frequency  Min 2X/week        Progress Toward Goals  OT Goals(current goals can now be found in the care plan section)     Acute Rehab OT Goals OT Goal Formulation: With patient Time For Goal Achievement: 12/15/23 Potential to Achieve Goals: Good  Plan         AM-PAC OT "6 Clicks" Daily Activity     Outcome Measure   Help from another person eating meals?: None Help from another person taking care of personal grooming?: A Little Help from another person toileting, which includes using toliet, bedpan, or urinal?: A Little Help from another person bathing (including washing, rinsing, drying)?: A Little Help from another person to put on and taking off regular upper body clothing?: A Little Help from another person to put on and taking off regular  lower body clothing?: A Lot 6 Click Score: 18    End of Session Equipment Utilized During Treatment: Other (comment) (n/a)  OT Visit Diagnosis: Pain;Muscle weakness (generalized) (M62.81) Pain - part of body:  (back and abdomen)   Activity Tolerance Patient limited by pain    Patient Left in bed;with call bell/phone within reach   Nurse Communication Other (comment)        Time: 1884-1660 OT Time Calculation (min): 14 min  Charges: OT General Charges $OT Visit: 1 Visit OT Treatments $Self Care/Home Management : 8-22 mins     Sheralyn Dies, OTR/L 12/03/2023, 5:29 PM

## 2023-12-03 NOTE — Progress Notes (Signed)
 Mobility Specialist - Progress Note   12/03/23 1218  Mobility  Activity Ambulated with assistance in hallway  Level of Assistance Standby assist, set-up cues, supervision of patient - no hands on  Assistive Device Front wheel walker  Distance Ambulated (ft) 110 ft  Range of Motion/Exercises Active  Activity Response Tolerated well  Mobility Referral Yes  Mobility visit 1 Mobility  Mobility Specialist Start Time (ACUTE ONLY) 1211  Mobility Specialist Stop Time (ACUTE ONLY) 1218  Mobility Specialist Time Calculation (min) (ACUTE ONLY) 7 min   Pt was found in room and agreeable to ambulate. No complaints with session and at EOS returned to room with all needs met. Call bell in reach and daughter in room.  Lorna Rose Mobility Specialist

## 2023-12-04 LAB — HEMOGLOBIN: Hemoglobin: 9.5 g/dL — ABNORMAL LOW (ref 12.0–15.0)

## 2023-12-04 NOTE — Plan of Care (Signed)

## 2023-12-04 NOTE — Discharge Summary (Signed)
 Physician Discharge Summary  Patient ID: [name] MRN: [id number] DOB/AGE: [birth year] 68 y.o.  Admit date: 11/30/2023 Discharge date: 12/04/2023  Admission Diagnoses: Patient Active Problem List   Diagnosis Date Noted   Colovaginal fistula 11/30/2023   Diskitis 10/04/2023   Hypotension 09/06/2023   Fracture of tenth thoracic vertebra (HCC) 09/05/2023   History of discitis 09/05/2023   Senile osteoporosis 09/05/2023   History of urinary retention 09/05/2023   Vaginal atrophy 09/05/2023   Nocturia 09/05/2023   Vulvar ulcer 09/05/2023   Pulmonary nodule 08/02/2023   Non-traumatic compression fracture of vertebral column (HCC) 06/24/2023   Nonobstructive atherosclerosis of coronary artery 02/10/2023   Bilateral pseudophakia 02/03/2023   Vertebral fracture, osteoporotic (HCC) 01/06/2023   Ventriculitis of brain due to bacteria 11/02/2022   Anemia of chronic illness 10/12/2022   Bipolar disorder, in partial remission, most recent episode depressed (HCC) 10/07/2022   Adjustment disorder with mixed anxiety and depressed mood 09/30/2022   Chronic wound 09/25/2022   Hyperglycemia 09/25/2022   Chronic midline thoracic back pain 09/17/2022   Primary insomnia 09/17/2022   Chronic combined systolic and diastolic CHF (congestive heart failure) (HCC) 09/14/2022   Acute blood loss anemia 09/09/2022   Infection of lumbar spine (HCC) 09/03/2022   Malnutrition of moderate degree 08/20/2022   Hardware complicating wound infection (HCC) 08/20/2022   Infection of deep incisional surgical site after procedure 08/20/2022   Aspiration pneumonia of both lungs (HCC) 08/19/2022   Acute respiratory failure with hypoxia (HCC) 08/19/2022   Sepsis (HCC) 08/19/2022   Seizure (HCC) 08/19/2022   Wound dehiscence 08/09/2022   Delayed surgical wound healing 07/08/2022   Fracture of lumbar spine without cord injury (HCC) 06/26/2022   Lumbar vertebral fracture (HCC) 06/25/2022   Mood disorder (HCC)  09/29/2021   Generalized muscle weakness 04/30/2019   Mild cognitive impairment 09/06/2018   Hyponatremia 02/28/2018   S/P shoulder replacement, left 08/19/2016   Anxiety 10/17/2014   Acid reflux 10/17/2014   BP (high blood pressure) 10/17/2014   Arthritis, degenerative 10/17/2014   Adult hypothyroidism 10/17/2014   UTI (urinary tract infection) 06/03/2014   Fracture of bone adjacent to prosthesis 06/03/2014   Peri-prosthetic fracture of femur following total hip arthroplasty 06/02/2014   Urinary retention 05/13/2014   Chronic pain 05/10/2014   History of hip surgery 05/10/2014   Osteoarthritis 09/30/2011   UNSPECIFIED HEART FAILURE 06/24/2010   UNSPECIFIED CONGENITAL DEFECT OF SEPTAL CLOSURE 06/24/2010   Secondary cardiomyopathy (HCC) 03/18/2010   Tobacco use 03/18/2010   DM 06/13/2009   HYPERTENSION, UNSPECIFIED 06/13/2009   VENTRICULAR TACHYCARDIA 06/13/2009   VENTRICULAR SEPTAL DEFECT, CONGENITAL 06/13/2009   Essential (primary) hypertension 06/13/2009   Diabetes mellitus type 2, noninsulin dependent (HCC) 06/13/2009    Discharge Diagnoses:  Principal Problem:   Colovaginal fistula Active Problems:   HYPERTENSION, UNSPECIFIED   Peri-prosthetic fracture of femur following total hip arthroplasty   Anxiety   Chronic pain   Essential (primary) hypertension   Arthritis, degenerative   Adult hypothyroidism   Fracture of bone adjacent to prosthesis   History of hip surgery   Mood disorder (HCC)   Bipolar disorder, in partial remission, most recent episode depressed (HCC)   Anemia of chronic illness   Diabetes mellitus type 2, noninsulin dependent (HCC)   Discharged Condition: stable  Hospital Course:  Patient was admitted to the floor following robotic low anterior resection with colovaginal fistula takedown and repair by Dr. Hershell Lose in conjunction with urology (Dr. Parke Boll) on 11/30/2023.  Patient did have some  hematuria and had a drop in her hemoglobin.  She was able to  advance her diet relatively quickly.  She received a dose of IV iron .  Metformin  was resumed.  She was resumed on her oral antibiotics due to chronic spinal hardware infection.  She dropped her hemoglobin some more and it was below 7.  She did receive 2 units of blood.  This brought her up to hemoglobin of 9 and on postop day 4 she was discharged home in stable condition.  She is passing gas and having bowel movements and tolerating a soft diet.  She is afebrile and urinating spontaneously.  She was ambulating as well and had adequate pain control.  She did require significant amount of oral pain medication due to her chronic pain.  So therefore she had her chronic amount of pain medication with some on top of it for the more acute pain.   Consults: urology  Significant Diagnostic Studies: labs: Cr 0.38. Hgb 9.5 prior to d/c  Treatments: surgery: see above and transfusion  Discharge Exam: Blood pressure (!) 163/91, pulse (!) 102, temperature 98.7 F (37.1 C), temperature source Oral, resp. rate 15, height 5' (1.524 m), weight 62.3 kg, SpO2 100%. Alert and oriented, good spirits Abd, soft, non distended, some bruising at extraction port.    Disposition: Discharge disposition: 01-Home or Self Care       Discharge Instructions     Call MD for:   Complete by: As directed    FEVER > 101.5 F  (temperatures < 101.5 F are not significant)   Call MD for:  difficulty breathing, headache or visual disturbances   Complete by: As directed    Call MD for:  difficulty breathing, headache or visual disturbances   Complete by: As directed    Call MD for:  extreme fatigue   Complete by: As directed    Call MD for:  hives   Complete by: As directed    Call MD for:  hives   Complete by: As directed    Call MD for:  persistant dizziness or light-headedness   Complete by: As directed    Call MD for:  persistant nausea and vomiting   Complete by: As directed    Call MD for:  persistant nausea and  vomiting   Complete by: As directed    Call MD for:  persistant nausea and vomiting   Complete by: As directed    Call MD for:  redness, tenderness, or signs of infection (pain, swelling, redness, odor or green/yellow discharge around incision site)   Complete by: As directed    Call MD for:  redness, tenderness, or signs of infection (pain, swelling, redness, odor or green/yellow discharge around incision site)   Complete by: As directed    Call MD for:  redness, tenderness, or signs of infection (pain, swelling, redness, odor or green/yellow discharge around incision site)   Complete by: As directed    Call MD for:  severe uncontrolled pain   Complete by: As directed    Call MD for:  severe uncontrolled pain   Complete by: As directed    Call MD for:  severe uncontrolled pain   Complete by: As directed    Call MD for:  temperature >100.4   Complete by: As directed    Call MD for:  temperature >100.4   Complete by: As directed    Diet - low sodium heart healthy   Complete by: As directed  Start with a bland diet such as soups, liquids, starchy foods, low fat foods, etc. the first few days at home. Gradually advance to a solid, low-fat, high fiber diet by the end of the first week at home.   Add a fiber supplement to your diet (Metamucil, etc) If you feel full, bloated, or constipated, stay on a full liquid or pureed/blenderized diet for a few days until you feel better and are no longer constipated.   Diet - low sodium heart healthy   Complete by: As directed    Diet - low sodium heart healthy   Complete by: As directed    Discharge instructions   Complete by: As directed    See Discharge Instructions If you are not getting better after two weeks or are noticing you are getting worse, contact our office [phone] for further advice.  We may need to adjust your medications, re-evaluate you in the office, send you to the emergency room, or see what other things we can do to  help. The clinic staff is available to answer your questions during regular business hours (8:30am-5pm).  Please don't hesitate to call and ask to speak to one of our nurses for clinical concerns.    A surgeon from [facility] is always on call at the hospitals 24 hours/day If you have a medical emergency, go to the nearest emergency room or call 911.   Discharge wound care:   Complete by: As directed    It is good for closed incisions and even open wounds to be washed every day.  Shower every day.  Short baths are fine.  Wash the incisions and wounds clean with soap & water .    You may leave closed incisions open to air if it is dry.   You may cover the incision with clean gauze & replace it after your daily shower for comfort.  TEGADERM:  You have clear gauze band-aid dressings over your closed incision(s).  Remove the dressings 2 days after surgery. 5/16   Driving Restrictions   Complete by: As directed    You may drive when: - you are no longer taking narcotic prescription pain medication - you can comfortably wear a seatbelt - you can safely make sudden turns/stops without pain.   Increase activity slowly   Complete by: As directed    Start light daily activities --- self-care, walking, climbing stairs- beginning the day after surgery.  Gradually increase activities as tolerated.  Control your pain to be active.  Stop when you are tired.  Ideally, walk several times a day, eventually an hour a day.   Most people are back to most day-to-day activities in a few weeks.  It takes 4-6 weeks to get back to unrestricted, intense activity. If you can walk 30 minutes without difficulty, it is safe to try more intense activity such as jogging, treadmill, bicycling, low-impact aerobics, swimming, etc. Save the most intensive and strenuous activity for last (Usually 4-8 weeks after surgery) such as sit-ups, heavy lifting, contact sports, etc.  Refrain from any intense heavy lifting or  straining until you are off narcotics for pain control.  You will have off days, but things should improve week-by-week. DO NOT PUSH THROUGH PAIN.  Let pain be your guide: If it hurts to do something, don't do it.   Increase activity slowly   Complete by: As directed    Increase activity slowly   Complete by: As directed    Lifting restrictions  Complete by: As directed    If you can walk 30 minutes without difficulty, it is safe to try more intense activity such as jogging, treadmill, bicycling, low-impact aerobics, swimming, etc. Save the most intensive and strenuous activity for last (Usually 4-8 weeks after surgery) such as sit-ups, heavy lifting, contact sports, etc.   Refrain from any intense heavy lifting or straining until you are off narcotics for pain control.  You will have off days, but things should improve week-by-week. DO NOT PUSH THROUGH PAIN.  Let pain be your guide: If it hurts to do something, don't do it.  Pain is your body warning you to avoid that activity for another week until the pain goes down.   May shower / Bathe   Complete by: As directed    May walk up steps   Complete by: As directed    Remove dressing in 48 hours   Complete by: As directed    Make sure all dressings have been removed on the second day after surgery = 5/16 Friday Leave incisions open to air.  OK to cover incisions with gauze or bandages as desired   Sexual Activity Restrictions   Complete by: As directed    You may have sexual intercourse when it is comfortable. If it hurts to do something, stop.      Allergies as of 12/04/2023       Reactions   Gallium (radioactive Isotope) Swelling   Pt states she can not have radioactive injections.    Paxlovid [nirmatrelvir-ritonavir]         Medication List     TAKE these medications    acetaminophen  325 MG tablet Commonly known as: TYLENOL  Take 1-2 tablets (325-650 mg total) by mouth every 6 (six) hours as needed for mild pain or  headache.   Acidophilus Caps capsule Take 1 capsule (100 mg total) by mouth 2 (two) times daily. What changed: when to take this   ascorbic acid  500 MG tablet Commonly known as: VITAMIN C  Take 500 mg by mouth daily.   ASPIRIN  81 PO Take 81 mg by mouth daily.   buPROPion  150 MG 24 hr tablet Commonly known as: WELLBUTRIN  XL Take 1 tablet (150 mg total) by mouth daily.   busPIRone  7.5 MG tablet Commonly known as: BUSPAR  Take 7.5 mg by mouth 2 (two) times daily.   Calcium  600+D3 600-20 MG-MCG Tabs Generic drug: Calcium  Carb-Cholecalciferol  Take 1 tablet by mouth in the morning and at bedtime.   carvedilol  25 MG tablet Commonly known as: COREG  Take 1 tablet (25 mg total) by mouth 2 (two) times daily with a meal.   cyclobenzaprine  10 MG tablet Commonly known as: FLEXERIL  Take 10 mg by mouth 3 (three) times daily.   denosumab 60 MG/ML Sosy injection Commonly known as: PROLIA Inject 60 mg into the skin every 6 (six) months.   diphenoxylate -atropine  2.5-0.025 MG tablet Commonly known as: LOMOTIL  Take 1 tablet by mouth 2 (two) times daily as needed for diarrhea or loose stools.   donepezil  10 MG tablet Commonly known as: ARICEPT  Take 10 mg by mouth every morning.   doxycycline  100 MG tablet Commonly known as: VIBRA -TABS Take 1 tablet (100 mg total) by mouth 2 (two) times daily.   empagliflozin  10 MG Tabs tablet Commonly known as: Jardiance  Take 1 tablet (10 mg total) by mouth daily before breakfast.   estradiol  0.1 MG/GM vaginal cream Commonly known as: ESTRACE  Place 0.5g nightly for two weeks then twice a week after  famotidine  20 MG tablet Commonly known as: PEPCID  Take 1 tablet (20 mg total) by mouth 2 (two) times daily. What changed: when to take this   fenoprofen 600 MG Tabs tablet Commonly known as: NALFON  Take 600 mg by mouth in the morning and at bedtime.   fluvoxaMINE  100 MG tablet Commonly known as: LUVOX  Take 1 tablet (100 mg total) by mouth at  bedtime.   gabapentin  100 MG capsule Commonly known as: NEURONTIN  Take 1 capsule (100 mg total) by mouth 3 (three) times daily.   lamoTRIgine  100 MG tablet Commonly known as: LAMICTAL  Take 200 mg by mouth at bedtime.   levothyroxine  88 MCG tablet Commonly known as: SYNTHROID  Take 1 tablet (88 mcg total) by mouth daily before breakfast.   losartan  25 MG tablet Commonly known as: COZAAR  Take 0.5 tablets (12.5 mg total) by mouth daily.   Melatonin 10 MG Tabs Take 10 mg by mouth at bedtime.   metFORMIN  500 MG tablet Commonly known as: GLUCOPHAGE  Take 1 tablet (500 mg total) by mouth 2 (two) times daily with a meal.   multivitamin tablet Take 1 tablet by mouth daily.   nicotine  21 mg/24hr patch Commonly known as: NICODERM CQ  - dosed in mg/24 hours Place 21 mg onto the skin daily. What changed: Another medication with the same name was removed. Continue taking this medication, and follow the directions you see here.   nicotine  polacrilex 4 MG gum Commonly known as: NICORETTE  Take 4 mg by mouth as needed for smoking cessation.   ondansetron  4 MG tablet Commonly known as: ZOFRAN  Take 1 tablet (4 mg total) by mouth every 8 (eight) hours as needed for refractory nausea / vomiting.   oxyCODONE  5 MG immediate release tablet Commonly known as: Oxy IR/ROXICODONE  Take 1-3 tablets (5-15 mg total) by mouth every 4 (four) hours as needed for moderate pain (pain score 4-6), severe pain (pain score 7-10) or breakthrough pain (5mg  for moderate pain, 10mg  for severe pain).   pantoprazole  40 MG tablet Commonly known as: PROTONIX  Take 40 mg by mouth every morning.   potassium chloride  10 MEQ tablet Commonly known as: KLOR-CON  Take 20 mEq by mouth 3 (three) times daily.   rosuvastatin  20 MG tablet Commonly known as: CRESTOR  Take 20 mg by mouth daily.   sodium chloride  1 g tablet Take 1 tablet (1 g total) by mouth 2 (two) times daily with a meal.   traZODone  100 MG tablet Commonly  known as: DESYREL  Take 1 tablet (100 mg total) by mouth at bedtime.   Vitamin D3 50 MCG (2000 UT) capsule Take 2,000 Units by mouth daily.   Vraylar  1.5 MG capsule Generic drug: cariprazine  Take 1.5 mg by mouth at bedtime.   ZINC PO Take 50 mg by mouth daily.               Discharge Care Instructions  (From admission, onward)           Start     Ordered   11/30/23 0000  Discharge wound care:       Comments: It is good for closed incisions and even open wounds to be washed every day.  Shower every day.  Short baths are fine.  Wash the incisions and wounds clean with soap & water .    You may leave closed incisions open to air if it is dry.   You may cover the incision with clean gauze & replace it after your daily shower for comfort.  TEGADERM:  You have clear gauze band-aid dressings over your closed incision(s).  Remove the dressings 2 days after surgery. 5/16   11/30/23 1047            Follow-up Information     Candyce Champagne, MD Follow up on 12/26/2023.   Specialties: General Surgery, Colon and Rectal Surgery Why: To follow up after your operation Contact information: [address] [place] [address] [phone]         Care, [facility] Follow up.   Specialty: Home Health Services Why: Home health Physical Therapy/Occupational Therapu Contact information: [address] [place] [address] [phone]                 Signed: Lockie Rima 12/04/2023, 9:46 AM

## 2023-12-05 LAB — BPAM RBC
Blood Product Expiration Date: 202506112359
Blood Product Expiration Date: 202506112359
ISSUE DATE / TIME: 202505171356
ISSUE DATE / TIME: 202505171653
Unit Type and Rh: 5100
Unit Type and Rh: 5100

## 2023-12-05 LAB — TYPE AND SCREEN
ABO/RH(D): O POS
Antibody Screen: NEGATIVE
Unit division: 0
Unit division: 0

## 2023-12-18 NOTE — Progress Notes (Deleted)
 Subjective:  Chief complaint: Still with low back pain   Patient ID: [name], female    DOB: 11-21-1955, 67 y.o.   MRN: [id number]  HPI   68 y.o. female with hx of scoliosis surgery with extensive lower thoracic and lumbar fusion roughly 67yrs ago, she suffered ground level fall in late November, where she fractures through all three columns of L3, thus went to OR on 12/9 for stabilization, new  posterolateral instrumentation fusion on June 26, 2022 complicated by Wound dehiscence with exposed hardware and unstable lumbar spine fracture status post revision of lumbar wound with removal of L1-3 4 and 5 lumbar screws and rod and removal of right L1 screw and revision of L1-L5 posterior instrumented fusion on August 09, 2022 and now concern for postoperative wound infection and ventriculitis. She had complicated protracted course in the icu, but eventually was discharged to CIR on vancomycin  and cefepime  through 3/14 for ventriculitis and hw complicating wound infection. Now changed to doxcycyline plus levofloxacin  as of 3/15 while she continued to need medihoney with moistened quaze with sliver hydrofiber to the caudal portion of wound.   Mri of brain on 09/29/22- resolution of ventriculitis.   She was seen by my partner Dr. Levern Reader while she was in the inpatient rehab unit.  Follow-up was rescheduled for May 2024.  Interim history:  "Back pain did not appear to be worse but wound did persist and apparently tunneled cranially.  Her white blood cell count with [facility] was in the 16,000 range an abrupt change from her last labs as an inpatient when it was in the 7000 range.  He did have a mechanical fall and fell on her left knee where she has a prosthetic joint.  When I last saw her we rechecked her inflammatory markers and sed rate had titrated down though CRP was going up to 15.6  WBC recheck with our lab was 12,000.  In the interim she has been seen by Dr.  Dorothy Gates with neurosurgery and CT of the thoracic and lumbar spine were performed.  These have shown: A new T10 compression fracture with 12% loss of height with unchanged change adjacent chronic T10-T11 T12-L1 posterior element ankylosis with chronic nonfusion at T11-T12, L3 superior endplate nonacute fracture and subtle horizontal fracture of the chronic posterior element of the bone, difficult exclude nondisplaced S3-4 sacral fracture with mild presacral stranding.   Checked labs last time we saw her white blood cell count remained elevated 12,000.  Sed rate and CRP [place] sed rate actually normalized and CRP had gone up.  Changed her evofloxacin and doxycycline  to cefdinir  and doxycycline .  She is continuing to take these.  She did develop a painful erythematous rash on her hands due to doxycycline  use in the context of sun exposure at the beach.  This is subsequently resolved she has a new somewhat purpuric rash on her right arm that came up in the last few days " She returns to clinic today for follow-up  Discussed the use of AI scribe software for clinical note transcription with the patient, who gave verbal consent to proceed.  At her last visit with me on July 14, 2023 the patient had developed worsening pain in the thoracic spine and hip.  She was also with reduced strength and mobility with having to to now use a walker compared to a cane.  The patient had an MRI of the thoracic and lumbar spine pending after an initial scan done with [facility] had suggested potential infection in the spine.  She also has been found to have a 8 mm solid mass in the right lung on CT scan.  The MRI was ultimately read and showed  IMPRESSION: 1. Fluid signal within the L3-L4 and L4-L5 discs is suspicious for discitis-osteomyelitis. Progressed bone marrow edema within the L4 vertebral body and bilateral pedicles. There is also increased bone marrow edema at the superior  endplate of L5 and right L5 pedicle. 2. Possible subacute superior endplate fractures of the L4 and L5 vertebral bodies without associated height loss. 3. Minimal residual marrow edema associated with L3 superior endplate fracture. 4. 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  The case was discussed with Dr. Julane Ny and we both agreed that it would be prudent to have the patient's stop antibiotics and pursue IR guided biopsy for culture after she had been off antibiotics for several weeks.  I called the patient and her daughter on the 26th in the evening and asked that she stop antibiotics so that we could get a culture after she had been off antibiotics for several weeks.  She did not stop antibiotics that night.  In the interim she did have worsening of her back pain she underwent IR guided biopsy with culture yesterday with Gram stain having been negative no organisms isolated yet to date.  With regards to the her lung nodule we have done lab work here including a cryptococcal antigen in the serum which is negative urine histoplasma antigen and Blastomyces antigen.  She has been seen by Dr. Baldwin Levee who noted that there was a pneumonia in the area where the lung nodule was seen and he wondered if this finding read as a nodule that might instead represent scarring.  He wanted to pursue further imaging with a PET scan before considering bronchoscopic be with BAL and biopsies.  At her visit with me on December 26th, 2024 patient had worsening pain in an thoracic and lumbar spine after initial scan with [facility] had suggested potential infection in the spine.  She was then found to have an 8 mm solid mass in the right lung on CT scan MRI was ultimately read and showed fluid signal within the L3-L4 and L4-L5 disc suspicious for discitis osteomyelitis with progressed bone marrow edema in the L4 vertebral body and bilateral pedicles also with increased bone marrow edema in  the superior endplate of L5 and right L5 pedicle with possible versus subacute.  Endplate fractures in L4 and L5 with minimal residual bone marrow associated L3 superior endplate fracture with chronic findings of arachnoiditis in the lower lumbar spine.  I called the patient and her daughter on the 43 and had them stop antibiotics with plans for IR guided biopsy for culture.  This was accomplished after they had held antibiotics for several weeks.  Cultures were unrevealing.  We subsequently started her on yet another empiric regimen of oral doxycycline  and cefadroxil  until we could get a line placed at that point in time we placed her on IV daptomycin and cefepime .  In the interim she had complained of vaginal discharge though now I have found out that she has a rectovaginal fistula which could certainly explain that problem.  She then began to complain of malaise with labile blood pressures and lightheadedness nauseousness with tachycardia.    The patient thought there is due to side effects of IV antibiotics and we instructed  her hold IV cefepime  and continue myosin but  she actually did the reverse and stop the daptomycin and continued cefepime .  Recently yesterday when she was seen with cardiology she was hypotensive seated in the chair in the 60s and apparently they had recommended her being seen in the emergency room and potentially hospitalized.  She was given fluid bolus there with improvement in her blood pressure and her losartan  is on hold  Her back pain does not seem to be worse she has no respiratory complaints she is not complaining now of much in the way of nausea or abdominal pain.  She is does continue to have discharge from her rectovaginal fistula and also "gas" that will emanate from this.  I am quite worried that I have other intra-abdominal pathology such as intra-abdominal abscess or fistula to another organ that is causing all of her symptoms of malaise palpitations  nausea.  Review of her labs from home health showed slight leukocytosis of white count to 14,000 with slight eosinophilia though not terribly much above the normal range her inflammatory markers have come down in the last 2 weeks from 72-23 in the case of her sed rate and 30 116 and 51 in terms of her CRP.  She had been seen by urogynecology who had found frank stool in the vagina from the fistula.  They recommended her being seen by general surgery and wanted a CT scan of the abdomen pelvis performed I think getting a CT scan abdomen pelvis is critical and I would feel better if we could have her directly admitted to the hospital to speed up the process of getting a CT of the abdomen pelvis because I am worried that she may have more pathology than the rectovaginal fistula such as an intra-abdominal abscess driving symptoms.  However Ninette Basque is emphatic about going back into the hospital if at all possible."    She had her CT abdomen pelvis performed which showed the following:  IMPRESSION: Image degradation in lower pelvis due to bilateral hip prostheses and rectal tube.   Findings highly suspicious for gas-containing rectovaginal fistula,  With linear gas collection between the anterior wall of the sigmoid colon and bladder to the left of midline which descends to the region of the vaginal cuff (see image 83/12), highly suspicious for rectovaginal fistula.   Diffuse colonic diverticulosis, with muscular hypertrophy and sigmoid colon. No radiographic evidence of acute diverticulitis.   Multiple ill-defined ground-glass and subsolid nodular opacities in the visualized portions of the right middle and lower lobes. This is almost certainly inflammatory or infectious in etiology given that it is a new compared to recent PET-CT 1 month ago. Recommend clinical correlation, and continued follow-up by chest CT.    I saw the report of the CT scan with regards to the amatory groundglass  opacity had some anxiety about whether she could be having some daptomycin induced eosinophilic pneumonia.  The patient however has no respiratory symptoms other than some chronic dyspnea that preceded her being on antibiotic she has no cough.  Her CBC shows no peripheral eosinophilia and I do not think she has daptomycin induced developed pneumonia.  She still has GI upset but is been able to take her daptomycin and cefepime  with planned stop date of 18 March.  Her inflammatory markers have been a bit of a mixed picture with her sed rate having gone up to 59 from a value of 23 but that was down from 72 her CRP has come down consistently now at 14 from 51 and 116  prior to that her back pain seems relatively stable though it is worse than it was when she first was dealing with her ventriculitis.  She does have a compression fracture as well.  Surgery is being contemplated by her urogynecologist potentially with general surgery involvement as well.  In talking the patient and her daughter it seems as if the surgeons want her back infection "declared cured prior to operating.  I do not think that she should be necessary because her spine if she still harbors infection is of much lower risk to her as far as uncontrolled infection than her rectovaginal fistula.   I have a much greater anxiety her nto have source control in her abdomen and pelvis.     Her pain had been significantly worse at last visit we ordered MRI of the spine:  The scan was read as follows    IMPRESSION: 1. Postoperative changes of T11-S1 laminectomy and posterior spinal fusion with right posterior spinal instrumentation from L2-L5. On prior CT, there is evidence of fracture through the left posterior fusion mass at L4-5, correlating with edema is seen in the left pedicle of L4 on today's study. 2. Decreased edema within the L4 and L5 vertebral bodies and posterior elements, as well as within the L4-5 and L3-4 discs, likely  reactive and related to the above fracture. 3. Loculated simple fluid within the thecal sac with some marginalized cauda equina nerve roots, most consistent with arachnoiditis. 4. No spinal canal stenosis or neural foraminal narrowing.   Patient was seen by Dr. Candyce Champagne who scheduled her for surgery and on  12/01/2023 she underwent  -ROBOTIC LOW ANTERIOR RECTOSIGMOID RESECTION (LAR) WITH ANASTOMOSIS -COLOVAGINAL FISTULA TAKEDOWN & REPAIR -OMENTOPEXY OF VAGINA -INTRAOPERATIVE ASSESSMENT OF TISSUE VASCULAR PERFUSION USING ICG (indocyanine green ) IMMUNOFLUORESCENCE -TRANSVERSUS ABDOMINIS PLANE (TAP) BLOCK - BILATERAL -FLEXIBLE SIGMOIDOSCOPY   Urology did joint surgery and did   1.  Cystoscopy with bilateral ureteral FireFly injections 2.  Transurethral resection of  possible bladder tumor--large  Pathology came back with:  A. BLADDER TUMOR, TRANSURETHERAL RESECTON:  Florid cystitis cystica and papillary cystitis  Negative for carcinoma   B. RECTOSIGMOID COLON RESECTION:  Diverticula with chronic inflammation, perforation, fistula and reactive  fibrosis  Negative for carcinoma  Five benign lymph nodes (0/5)   C. FINAL DISTAL MARGIN:  Benign colonic tissue                Past Medical History:  Diagnosis Date   Anemia    Anxiety    Arthritis    Bipolar disorder (HCC)    Cardiomyopathy (HCC)    Complication of anesthesia    Awake but could not move   Depression    Diskitis 10/04/2023   GERD (gastroesophageal reflux disease)    Heart failure (HCC)    Acute   Heart murmur    "related to VSD"   High cholesterol    History of blood transfusion    "related to OR" (08/19/2016)   History of hiatal hernia    Hypertension    Hyperthyroidism    Hypotension 09/06/2023   Mild cognitive impairment 09/06/2018   Paroxysmal ventricular tachycardia (HCC)    Pneumonia    Rectovaginal fistula 09/06/2023   Tachycardia, unspecified    Type II diabetes mellitus (HCC)     UTI (urinary tract infection)    being treated with Keflex    Ventricular septal defect    Ventriculitis of brain due to bacteria 11/02/2022   Vertebral fracture, osteoporotic (HCC)  01/06/2023    Past Surgical History:  Procedure Laterality Date   ABDOMINAL HYSTERECTOMY     BACK SURGERY     CARDIAC CATHETERIZATION N/A 06/23/2015   Procedure: Left Heart Cath and Coronary Angiography;  Surgeon: Darlis Eisenmenger, MD;  Location: [facility] INVASIVE CV LAB;  Service: Cardiovascular;  Laterality: N/A;   CARDIAC CATHETERIZATION  1960   "VSD was so small; didn't need repaired"   COLECTOMY, SIGMOID, ROBOT-ASSISTED N/A 11/30/2023   Procedure: COLECTOMY, SIGMOID, ROBOT-ASSISTED ROBOTIC LOW ANTERIOR RECTOSIGMOID RESECTION (LAR) WITH ANASTOMOSIS COLOVAGINAL FISTULA TAKEDOWN & REPAIR OMENTOPEXY OF VAGINA INTRAOPERATIVE ASSESSMENT OF TISSUE VASCULAR PERFUSION USING ICG (indocyanine green ) IMMUNOFLUORESCENCE TRANSVERSUS ABDOMINIS PLANE (TAP) BLOCK - BILATERAL;  Surgeon: Candyce Champagne, MD;  Location: [facility] ORS;  Service: General;  Late   CYSTOSCOPY WITH INDOCYANINE GREEN  IMAGING (ICG) N/A 11/30/2023   Procedure: Cystoscopy with bilateral ureteral FireFly injections Transurethral resection of bladder tumor--large;  Surgeon: Samson Croak, MD;  Location: [facility];  Service: Urology;  Laterality: N/A;   EXAM UNDER ANESTHESIA WITH MANIPULATION OF HIP Right 06/02/2014   dr Ana Balling   FLEXIBLE SIGMOIDOSCOPY N/A 11/30/2023   Procedure: Marlynn Singer;  Surgeon: Candyce Champagne, MD;  Location: [facility];  Service: General;  Laterality: N/A;   FRACTURE SURGERY     HERNIA REPAIR     HIP CLOSED REDUCTION Right 06/02/2014   Procedure: CLOSED MANIPULATION HIP;  Surgeon: Alphonzo Ask, MD;  Location: [facility] OR;  Service: Orthopedics;  Laterality: Right;   IR FLUORO GUIDED NEEDLE PLC ASPIRATION/INJECTION LOC  08/09/2023   JOINT REPLACEMENT     JOINT REPLACEMENT     POSTERIOR LUMBAR FUSION 4 LEVEL N/A 06/26/2022   Procedure:  Lumbar One To Lumbar Five Posterior Instrumented Fusion;  Surgeon: Cannon Champion, MD;  Location: [facility] OR;  Service: Neurosurgery;  Laterality: N/A;   REFRACTIVE SURGERY Bilateral    SHOULDER ARTHROSCOPY Right    SHOULDER OPEN ROTATOR CUFF REPAIR Right    SPINAL FUSION  1996   "t10 down to my coccyx   SPINE HARDWARE REMOVAL     TOTAL ABDOMINAL HYSTERECTOMY     TOTAL HIP ARTHROPLASTY Right 05/10/2014   [facility]      by dr Veryl Gottron olcott   TOTAL KNEE ARTHROPLASTY Left    TOTAL SHOULDER ARTHROPLASTY Left 08/19/2016   Procedure: TOTAL SHOULDER ARTHROPLASTY;  Surgeon: Sammye Cristal, MD;  Location: [facility] OR;  Service: Orthopedics;  Laterality: Left;  Left total shoulder replacement    Family History  Problem Relation Age of Onset   Other Mother        alive   Stroke Father 78       deceased   Dementia Father    Chorea Maternal Grandfather    Dementia Maternal Aunt    Dementia Maternal Aunt    Heart attack Other        multiple uncles have died with myocardial infarction   Bladder Cancer Neg Hx    Uterine cancer Neg Hx       Social History   Socioeconomic History   Marital status: Married    Spouse name: Not on file   Number of children: Not on file   Years of education: Not on file   Highest education level: Master's degree (e.g., MA, MS, MEng, MEd, MSW, MBA)  Occupational History   Occupation: Magazine features editor: SELF-EMPLOYED    Comment: former  Tobacco Use   Smoking status: Former    Types: Cigarettes  Passive exposure: Never   Smokeless tobacco: Never   Tobacco comments:    03/30/21 smokes 2 cigs daily.      08/06/22 Camilo Cella stated that the patient is not smoking while at [facility] for rehab. KM    11-23-23 has been quit for 3 weeks  Vaping Use   Vaping status: Never Used  Substance and Sexual Activity   Alcohol use: No   Drug use: No   Sexual activity: Not Currently    Partners: Male    Birth control/protection: Surgical    Comment:  Hysterectomy  Other Topics Concern   Not on file  Social History Narrative   Right handed   Caffeine  2-3 cups daily    Lives at home with husband    Social Drivers of Health   Financial Resource Strain: Low Risk  (07/08/2023)   Received from [facility]   Overall Financial Resource Strain (CARDIA)    Difficulty of Paying Living Expenses: Not hard at all  Food Insecurity: No Food Insecurity (11/30/2023)   Hunger Vital Sign    Worried About Running Out of Food in the Last Year: Never true    Ran Out of Food in the Last Year: Never true  Transportation Needs: No Transportation Needs (11/30/2023)   PRAPARE - Administrator, Civil Service (Medical): No    Lack of Transportation (Non-Medical): No  Physical Activity: Not on file  Stress: Not on file  Social Connections: Socially Integrated (11/30/2023)   Social Connection and Isolation Panel [NHANES]    Frequency of Communication with Friends and Family: Twice a week    Frequency of Social Gatherings with Friends and Family: Twice a week    Attends Religious Services: 1 to 4 times per year    Active Member of [employer] or Organizations: No    Attends Engineer, structural: 1 to 4 times per year    Marital Status: Married    Allergies  Allergen Reactions   Gallium (Radioactive Isotope) Swelling    Pt states she can not have radioactive injections.    Paxlovid [Nirmatrelvir-Ritonavir]      Current Outpatient Medications:    acetaminophen  (TYLENOL ) 325 MG tablet, Take 1-2 tablets (325-650 mg total) by mouth every 6 (six) hours as needed for mild pain or headache., Disp: , Rfl:    ascorbic acid  (VITAMIN C ) 500 MG tablet, Take 500 mg by mouth daily., Disp: , Rfl:    ASPIRIN  81 PO, Take 81 mg by mouth daily., Disp: , Rfl:    buPROPion  (WELLBUTRIN  XL) 150 MG 24 hr tablet, Take 1 tablet (150 mg total) by mouth daily., Disp: 30 tablet, Rfl: 0   busPIRone  (BUSPAR ) 7.5 MG tablet, Take 7.5 mg by mouth 2 (two)  times daily., Disp: , Rfl:    Calcium  Carb-Cholecalciferol  (CALCIUM  600+D3) 600-20 MG-MCG TABS, Take 1 tablet by mouth in the morning and at bedtime., Disp: , Rfl:    carvedilol  (COREG ) 25 MG tablet, Take 1 tablet (25 mg total) by mouth 2 (two) times daily with a meal., Disp: 60 tablet, Rfl: 0   Cholecalciferol  (VITAMIN D3) 50 MCG (2000 UT) capsule, Take 2,000 Units by mouth daily., Disp: , Rfl:    cyclobenzaprine  (FLEXERIL ) 10 MG tablet, Take 10 mg by mouth 3 (three) times daily., Disp: , Rfl:    denosumab (PROLIA) 60 MG/ML SOSY injection, Inject 60 mg into the skin every 6 (six) months., Disp: , Rfl:    diphenoxylate -atropine  (LOMOTIL ) 2.5-0.025  MG tablet, Take 1 tablet by mouth 2 (two) times daily as needed for diarrhea or loose stools., Disp: 30 tablet, Rfl: 0   donepezil  (ARICEPT ) 10 MG tablet, Take 10 mg by mouth every morning., Disp: , Rfl:    doxycycline  (VIBRA -TABS) 100 MG tablet, Take 1 tablet (100 mg total) by mouth 2 (two) times daily., Disp: 60 tablet, Rfl: 11   empagliflozin  (JARDIANCE ) 10 MG TABS tablet, Take 1 tablet (10 mg total) by mouth daily before breakfast., Disp: 90 tablet, Rfl: 3   estradiol  (ESTRACE ) 0.1 MG/GM vaginal cream, Place 0.5g nightly for two weeks then twice a week after, Disp: 30 g, Rfl: 3   famotidine  (PEPCID ) 20 MG tablet, Take 1 tablet (20 mg total) by mouth 2 (two) times daily. (Patient taking differently: Take 20 mg by mouth every evening.), Disp: 60 tablet, Rfl: 0   fenoprofen (NALFON ) 600 MG TABS tablet, Take 600 mg by mouth in the morning and at bedtime., Disp: , Rfl:    fluvoxaMINE  (LUVOX ) 100 MG tablet, Take 1 tablet (100 mg total) by mouth at bedtime., Disp: 30 tablet, Rfl: 0   gabapentin  (NEURONTIN ) 100 MG capsule, Take 1 capsule (100 mg total) by mouth 3 (three) times daily., Disp: 90 capsule, Rfl: 0   Lactobacillus (ACIDOPHILUS) 100 MG CAPS, Take 1 capsule (100 mg total) by mouth 2 (two) times daily. (Patient taking differently: Take 250 mg by mouth  daily.), Disp: 60 capsule, Rfl: 0   lamoTRIgine  (LAMICTAL ) 100 MG tablet, Take 200 mg by mouth at bedtime., Disp: , Rfl:    levothyroxine  (SYNTHROID ) 88 MCG tablet, Take 1 tablet (88 mcg total) by mouth daily before breakfast., Disp: 30 tablet, Rfl: 0   losartan  (COZAAR ) 25 MG tablet, Take 0.5 tablets (12.5 mg total) by mouth daily., Disp: , Rfl:    Melatonin 10 MG TABS, Take 10 mg by mouth at bedtime., Disp: , Rfl:    metFORMIN  (GLUCOPHAGE ) 500 MG tablet, Take 1 tablet (500 mg total) by mouth 2 (two) times daily with a meal., Disp: 60 tablet, Rfl: 0   Multiple Vitamin (MULTIVITAMIN) tablet, Take 1 tablet by mouth daily.  , Disp: , Rfl:    Multiple Vitamins-Minerals (ZINC PO), Take 50 mg by mouth daily., Disp: , Rfl:    nicotine  (NICODERM CQ  - DOSED IN MG/24 HOURS) 21 mg/24hr patch, Place 21 mg onto the skin daily., Disp: , Rfl:    nicotine  polacrilex (NICORETTE ) 4 MG gum, Take 4 mg by mouth as needed for smoking cessation., Disp: , Rfl:    ondansetron  (ZOFRAN ) 4 MG tablet, Take 1 tablet (4 mg total) by mouth every 8 (eight) hours as needed for refractory nausea / vomiting., Disp: 20 tablet, Rfl: 0   oxyCODONE  (OXY IR/ROXICODONE ) 5 MG immediate release tablet, Take 1-3 tablets (5-15 mg total) by mouth every 4 (four) hours as needed for moderate pain (pain score 4-6), severe pain (pain score 7-10) or breakthrough pain (5mg  for moderate pain, 10mg  for severe pain)., Disp: 60 tablet, Rfl: 0   pantoprazole  (PROTONIX ) 40 MG tablet, Take 40 mg by mouth every morning., Disp: , Rfl:    potassium chloride  (KLOR-CON ) 10 MEQ tablet, Take 20 mEq by mouth 3 (three) times daily., Disp: , Rfl:    rosuvastatin  (CRESTOR ) 20 MG tablet, Take 20 mg by mouth daily., Disp: , Rfl:    sodium chloride  1 g tablet, Take 1 tablet (1 g total) by mouth 2 (two) times daily with a meal., Disp: 60 tablet, Rfl: 0  traZODone  (DESYREL ) 100 MG tablet, Take 1 tablet (100 mg total) by mouth at bedtime., Disp: 30 tablet, Rfl: 0   VRAYLAR   1.5 MG capsule, Take 1.5 mg by mouth at bedtime., Disp: , Rfl:    Review of Systems     Objective:   Physical Exam       Assessment & Plan:

## 2023-12-19 ENCOUNTER — Ambulatory Visit: Payer: Self-pay | Admitting: Infectious Disease

## 2023-12-19 DIAGNOSIS — N823 Fistula of vagina to large intestine: Secondary | ICD-10-CM

## 2023-12-19 DIAGNOSIS — T847XXS Infection and inflammatory reaction due to other internal orthopedic prosthetic devices, implants and grafts, sequela: Secondary | ICD-10-CM

## 2023-12-19 DIAGNOSIS — M4626 Osteomyelitis of vertebra, lumbar region: Secondary | ICD-10-CM

## 2023-12-19 DIAGNOSIS — S32009G Unspecified fracture of unspecified lumbar vertebra, subsequent encounter for fracture with delayed healing: Secondary | ICD-10-CM

## 2023-12-19 DIAGNOSIS — M4645 Discitis, unspecified, thoracolumbar region: Secondary | ICD-10-CM

## 2024-01-04 ENCOUNTER — Telehealth: Payer: Self-pay

## 2024-01-04 DIAGNOSIS — R269 Unspecified abnormalities of gait and mobility: Secondary | ICD-10-CM

## 2024-01-04 NOTE — Telephone Encounter (Signed)
 Pt is requesting another referral to Neuro physical therapy. She just had surgery and is having balancing and walking concerns. Please advise.

## 2024-01-08 NOTE — Progress Notes (Unsigned)
 Subjective:  Chief complaint: Still with low back pain   Patient ID: [name], female    DOB: [birth year], 68 y.o.   MRN: [id number]  HPI   68 y.o. female with hx of scoliosis surgery with extensive lower thoracic and lumbar fusion roughly 32yrs ago, she suffered ground level fall in late November, where she fractures through all three columns of L3, thus went to OR on 12/9 for stabilization, new  posterolateral instrumentation fusion on June 26, 2022 complicated by Wound dehiscence with exposed hardware and unstable lumbar spine fracture status post revision of lumbar wound with removal of L1-3 4 and 5 lumbar screws and rod and removal of right L1 screw and revision of L1-L5 posterior instrumented fusion on August 09, 2022 and now concern for postoperative wound infection and ventriculitis. She had complicated protracted course in the icu, but eventually was discharged to CIR on vancomycin  and cefepime  through 3/14 for ventriculitis and hw complicating wound infection. Now changed to doxcycyline plus levofloxacin  as of 3/15 while she continued to need medihoney with moistened quaze with sliver hydrofiber to the caudal portion of wound.   Mri of brain on 09/29/22- resolution of ventriculitis.   She was seen by my partner Dr. Luiz while she was in the inpatient rehab unit.  Follow-up was rescheduled for May 2024.  Interim history:  Back pain did not appear to be worse but wound did persist and apparently tunneled cranially.  Her white blood cell count with [facility] was in the 16,000 range an abrupt change from her last labs as an inpatient when it was in the 7000 range.  He did have a mechanical fall and fell on her left knee where she has a prosthetic joint.  When I last saw her we rechecked her inflammatory markers and sed rate had titrated down though CRP was going up to 15.6  WBC recheck with our lab was 12,000.  In the interim she has been seen by Dr.  Rockney with neurosurgery and CT of the thoracic and lumbar spine were performed.  These have shown: A new T10 compression fracture with 12% loss of height with unchanged change adjacent chronic T10-T11 T12-L1 posterior element ankylosis with chronic nonfusion at T11-T12, L3 superior endplate nonacute fracture and subtle horizontal fracture of the chronic posterior element of the bone, difficult exclude nondisplaced S3-4 sacral fracture with mild presacral stranding.   Checked labs last time we saw her white blood cell count remained elevated 12,000.  Sed rate and CRP [place] sed rate actually normalized and CRP had gone up.  Changed her evofloxacin and doxycycline  to cefdinir  and doxycycline .  She is continuing to take these.  She did develop a painful erythematous rash on her hands due to doxycycline  use in the context of sun exposure at the beach.  This is subsequently resolved she has a new somewhat purpuric rash on her right arm that came up in the last few days  She returns to clinic today for follow-up  Discussed the use of AI scribe software for clinical note transcription with the patient, who gave verbal consent to proceed.  At her last visit with me on July 14, 2023 the patient had developed worsening pain in the thoracic spine and hip.  She was also with reduced strength and mobility with having to to now use a walker compared to a cane.  The patient had an MRI of the thoracic and lumbar spine pending after an initial scan done with [facility] had suggested potential infection in the spine.  She also has been found to have a 8 mm solid mass in the right lung on CT scan.  The MRI was ultimately read and showed  IMPRESSION: 1. Fluid signal within the L3-L4 and L4-L5 discs is suspicious for discitis-osteomyelitis. Progressed bone marrow edema within the L4 vertebral body and bilateral pedicles. There is also increased bone marrow edema at the superior  endplate of L5 and right L5 pedicle. 2. Possible subacute superior endplate fractures of the L4 and L5 vertebral bodies without associated height loss. 3. Minimal residual marrow edema associated with L3 superior endplate fracture. 4. 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  The case was discussed with Dr. Carollee and we both agreed that it would be prudent to have the patient's stop antibiotics and pursue IR guided biopsy for culture after she had been off antibiotics for several weeks.  I called the patient and her daughter on the 26th in the evening and asked that she stop antibiotics so that we could get a culture after she had been off antibiotics for several weeks.  She did not stop antibiotics that night.  In the interim she did have worsening of her back pain she underwent IR guided biopsy with culture yesterday with Gram stain having been negative no organisms isolated yet to date.  With regards to the her lung nodule we have done lab work here including a cryptococcal antigen in the serum which is negative urine histoplasma antigen and Blastomyces antigen.  She has been seen by Dr. Shelah who noted that there was a pneumonia in the area where the lung nodule was seen and he wondered if this finding read as a nodule that might instead represent scarring.  He wanted to pursue further imaging with a PET scan before considering bronchoscopic be with BAL and biopsies.  At her visit with me on December 26th, 2024 patient had worsening pain in an thoracic and lumbar spine after initial scan with [facility] had suggested potential infection in the spine.  She was then found to have an 8 mm solid mass in the right lung on CT scan MRI was ultimately read and showed fluid signal within the L3-L4 and L4-L5 disc suspicious for discitis osteomyelitis with progressed bone marrow edema in the L4 vertebral body and bilateral pedicles also with increased bone marrow edema in  the superior endplate of L5 and right L5 pedicle with possible versus subacute.  Endplate fractures in L4 and L5 with minimal residual bone marrow associated L3 superior endplate fracture with chronic findings of arachnoiditis in the lower lumbar spine.  I called the patient and her daughter on the 55 and had them stop antibiotics with plans for IR guided biopsy for culture.  This was accomplished after they had held antibiotics for several weeks.  Cultures were unrevealing.  We subsequently started her on yet another empiric regimen of oral doxycycline  and cefadroxil  until we could get a line placed at that point in time we placed her on IV daptomycin and cefepime .  In the interim she had complained of vaginal discharge though now I have found out that she has a rectovaginal fistula which could certainly explain that problem.  She then began to complain of malaise with labile blood pressures and lightheadedness nauseousness with tachycardia.    The patient thought there is due to side effects of IV antibiotics and we instructed  her hold IV cefepime  and continue myosin but  she actually did the reverse and stop the daptomycin and continued cefepime .  Recently yesterday when she was seen with cardiology she was hypotensive seated in the chair in the 60s and apparently they had recommended her being seen in the emergency room and potentially hospitalized.  She was given fluid bolus there with improvement in her blood pressure and her losartan  is on hold  Her back pain does not seem to be worse she has no respiratory complaints she is not complaining now of much in the way of nausea or abdominal pain.  She is does continue to have discharge from her rectovaginal fistula and also gas that will emanate from this.  I am quite worried that I have other intra-abdominal pathology such as intra-abdominal abscess or fistula to another organ that is causing all of her symptoms of malaise palpitations  nausea.  Review of her labs from home health showed slight leukocytosis of white count to 14,000 with slight eosinophilia though not terribly much above the normal range her inflammatory markers have come down in the last 2 weeks from 72-23 in the case of her sed rate and 30 116 and 51 in terms of her CRP.  She had been seen by urogynecology who had found frank stool in the vagina from the fistula.  They recommended her being seen by general surgery and wanted a CT scan of the abdomen pelvis performed I think getting a CT scan abdomen pelvis is critical and I would feel better if we could have her directly admitted to the hospital to speed up the process of getting a CT of the abdomen pelvis because I am worried that she may have more pathology than the rectovaginal fistula such as an intra-abdominal abscess driving symptoms.  However Dickey is emphatic about going back into the hospital if at all possible.    She had her CT abdomen pelvis performed which showed the following:  IMPRESSION: Image degradation in lower pelvis due to bilateral hip prostheses and rectal tube.   Findings highly suspicious for gas-containing rectovaginal fistula,  With linear gas collection between the anterior wall of the sigmoid colon and bladder to the left of midline which descends to the region of the vaginal cuff (see image 83/12), highly suspicious for rectovaginal fistula.   Diffuse colonic diverticulosis, with muscular hypertrophy and sigmoid colon. No radiographic evidence of acute diverticulitis.   Multiple ill-defined ground-glass and subsolid nodular opacities in the visualized portions of the right middle and lower lobes. This is almost certainly inflammatory or infectious in etiology given that it is a new compared to recent PET-CT 1 month ago. Recommend clinical correlation, and continued follow-up by chest CT.    I saw the report of the CT scan with regards to the amatory groundglass  opacity had some anxiety about whether she could be having some daptomycin induced eosinophilic pneumonia.  The patient however has no respiratory symptoms other than some chronic dyspnea that preceded her being on antibiotic she has no cough.  Her CBC shows no peripheral eosinophilia and I do not think she has daptomycin induced developed pneumonia.  She still has GI upset but is been able to take her daptomycin and cefepime  with planned stop date of 18 March.  Her inflammatory markers have been a bit of a mixed picture with her sed rate having gone up to 59 from a value of 23 but that was down from 72 her CRP has come down consistently now at 14 from 51 and 116  prior to that her back pain seems relatively stable though it is worse than it was when she first was dealing with her ventriculitis.  She does have a compression fracture as well.  Surgery is being contemplated by her urogynecologist potentially with general surgery involvement as well.  In talking the patient and her daughter it seems as if the surgeons want her back infection declared cured prior to operating.  I do not think that she should be necessary because her spine if she still harbors infection is of much lower risk to her as far as uncontrolled infection than her rectovaginal fistula.   I have a much greater anxiety her nto have source control in her abdomen and pelvis.     Her pain had been significantly worse at last visit we ordered MRI of the spine:  The scan was read as follows    IMPRESSION: 1. Postoperative changes of T11-S1 laminectomy and posterior spinal fusion with right posterior spinal instrumentation from L2-L5. On prior CT, there is evidence of fracture through the left posterior fusion mass at L4-5, correlating with edema is seen in the left pedicle of L4 on today's study. 2. Decreased edema within the L4 and L5 vertebral bodies and posterior elements, as well as within the L4-5 and L3-4 discs, likely  reactive and related to the above fracture. 3. Loculated simple fluid within the thecal sac with some marginalized cauda equina nerve roots, most consistent with arachnoiditis. 4. No spinal canal stenosis or neural foraminal narrowing.   Patient was seen by Dr. Elspeth Schultze who scheduled her for surgery and on  12/01/2023 she underwent  -ROBOTIC LOW ANTERIOR RECTOSIGMOID RESECTION (LAR) WITH ANASTOMOSIS -COLOVAGINAL FISTULA TAKEDOWN & REPAIR -OMENTOPEXY OF VAGINA -INTRAOPERATIVE ASSESSMENT OF TISSUE VASCULAR PERFUSION USING ICG (indocyanine green ) IMMUNOFLUORESCENCE -TRANSVERSUS ABDOMINIS PLANE (TAP) BLOCK - BILATERAL -FLEXIBLE SIGMOIDOSCOPY   Urology did joint surgery and did   1.  Cystoscopy with bilateral ureteral FireFly injections 2.  Transurethral resection of  possible bladder tumor--large  Pathology came back with:  A. BLADDER TUMOR, TRANSURETHERAL RESECTON:  Florid cystitis cystica and papillary cystitis  Negative for carcinoma   B. RECTOSIGMOID COLON RESECTION:  Diverticula with chronic inflammation, perforation, fistula and reactive  fibrosis  Negative for carcinoma  Five benign lymph nodes (0/5)   C. FINAL DISTAL MARGIN:  Benign colonic tissue      Assessment and Plan    Osteomyelitis/Diskitis  Chronic osteomyelitis with worsening back pain. MRI showed decreased edema in L4, L5. Differential includes infection versus fracture. Labs show improved inflammatory markers. Repeat biopsy not recommended due to low yield after prolonged antibiotics.  - Continue doxycycline  and Augmentin  for now --check ESR, CRP CBC and BMP --Reassess symptoms in two months. - Consider MRI if symptoms significantly worsen. - Discontinue doxycycline  temporarily during beach trip to avoid rash and assess if her pain worsens or stays the same while off of the doxycycline   Back pain Worsening back pain, possibly related to osteomyelitis.  Postoperative bleeding Bleeding from  hysterectomy scar site managed with gauze.   Colovaginal fistula: sp repair and partial colectomy, cystoscopy                   Past Medical History:  Diagnosis Date   Anemia    Anxiety    Arthritis    Bipolar disorder (HCC)    Cardiomyopathy (HCC)    Complication of anesthesia    Awake but could not move   Depression    Diskitis 10/04/2023  GERD (gastroesophageal reflux disease)    Heart failure (HCC)    Acute   Heart murmur    related to VSD   High cholesterol    History of blood transfusion    related to OR (08/19/2016)   History of hiatal hernia    Hypertension    Hyperthyroidism    Hypotension 09/06/2023   Mild cognitive impairment 09/06/2018   Paroxysmal ventricular tachycardia (HCC)    Pneumonia    Rectovaginal fistula 09/06/2023   Tachycardia, unspecified    Type II diabetes mellitus (HCC)    UTI (urinary tract infection)    being treated with Keflex    Ventricular septal defect    Ventriculitis of brain due to bacteria 11/02/2022   Vertebral fracture, osteoporotic (HCC) 01/06/2023    Past Surgical History:  Procedure Laterality Date   ABDOMINAL HYSTERECTOMY     BACK SURGERY     CARDIAC CATHETERIZATION N/A 06/23/2015   Procedure: Left Heart Cath and Coronary Angiography;  Surgeon: Ezra GORMAN Shuck, MD;  Location: [facility] INVASIVE CV LAB;  Service: Cardiovascular;  Laterality: N/A;   CARDIAC CATHETERIZATION  1960   VSD was so small; didn't need repaired   COLECTOMY, SIGMOID, ROBOT-ASSISTED N/A 11/30/2023   Procedure: COLECTOMY, SIGMOID, ROBOT-ASSISTED ROBOTIC LOW ANTERIOR RECTOSIGMOID RESECTION (LAR) WITH ANASTOMOSIS COLOVAGINAL FISTULA TAKEDOWN & REPAIR OMENTOPEXY OF VAGINA INTRAOPERATIVE ASSESSMENT OF TISSUE VASCULAR PERFUSION USING ICG (indocyanine green ) IMMUNOFLUORESCENCE TRANSVERSUS ABDOMINIS PLANE (TAP) BLOCK - BILATERAL;  Surgeon: Sheldon Standing, MD;  Location: [facility] ORS;  Service: General;  Late   CYSTOSCOPY WITH INDOCYANINE GREEN  IMAGING (ICG) N/A  11/30/2023   Procedure: Cystoscopy with bilateral ureteral FireFly injections Transurethral resection of bladder tumor--large;  Surgeon: Carolee Sherwood JONETTA DOUGLAS, MD;  Location: [facility] OR[facility];  Service: Urology;  Laterality: N/A;   EXAM UNDER ANESTHESIA WITH MANIPULATION OF HIP Right 06/02/2014   dr sheril   FLEXIBLE SIGMOIDOSCOPY N/A 11/30/2023   Procedure: KINGSTON SIDE;  Surgeon: Sheldon Standing, MD;  Location: [facility] OR[facility];  Service: General;  Laterality: N/A;   FRACTURE SURGERY     HERNIA REPAIR     HIP CLOSED REDUCTION Right 06/02/2014   Procedure: CLOSED MANIPULATION HIP;  Surgeon: Maude KANDICE sheril, MD;  Location: [facility] OR;  Service: Orthopedics;  Laterality: Right;   IR FLUORO GUIDED NEEDLE PLC ASPIRATION/INJECTION LOC  08/09/2023   JOINT REPLACEMENT     JOINT REPLACEMENT     POSTERIOR LUMBAR FUSION 4 LEVEL N/A 06/26/2022   Procedure: Lumbar One To Lumbar Five Posterior Instrumented Fusion;  Surgeon: Cheryle Debby LABOR, MD;  Location: [facility] OR;  Service: Neurosurgery;  Laterality: N/A;   REFRACTIVE SURGERY Bilateral    SHOULDER ARTHROSCOPY Right    SHOULDER OPEN ROTATOR CUFF REPAIR Right    SPINAL FUSION  1996   t10 down to my coccyx   SPINE HARDWARE REMOVAL     TOTAL ABDOMINAL HYSTERECTOMY     TOTAL HIP ARTHROPLASTY Right 05/10/2014   [place]      by dr lonni olcott   TOTAL KNEE ARTHROPLASTY Left    TOTAL SHOULDER ARTHROPLASTY Left 08/19/2016   Procedure: TOTAL SHOULDER ARTHROPLASTY;  Surgeon: Eva Herring, MD;  Location: [facility] OR;  Service: Orthopedics;  Laterality: Left;  Left total shoulder replacement    Family History  Problem Relation Age of Onset   Other Mother        alive   Stroke Father 22       deceased   Dementia Father    Chorea Maternal Grandfather  Dementia Maternal Aunt    Dementia Maternal Aunt    Heart attack Other        multiple uncles have died with myocardial infarction   Bladder Cancer Neg Hx    Uterine cancer Neg Hx       Social History    Socioeconomic History   Marital status: Married    Spouse name: Not on file   Number of children: Not on file   Years of education: Not on file   Highest education level: Master's degree (e.g., MA, MS, MEng, MEd, MSW, MBA)  Occupational History   Occupation: Magazine features editor: SELF-EMPLOYED    Comment: former  Tobacco Use   Smoking status: Former    Types: Cigarettes    Passive exposure: Never   Smokeless tobacco: Never   Tobacco comments:    03/30/21 smokes 2 cigs daily.      08/06/22 GLENWOOD Raisin stated that the patient is not smoking while at [facility] for rehab. KM    11-23-23 has been quit for 3 weeks  Vaping Use   Vaping status: Never Used  Substance and Sexual Activity   Alcohol use: No   Drug use: No   Sexual activity: Not Currently    Partners: Male    Birth control/protection: Surgical    Comment: Hysterectomy  Other Topics Concern   Not on file  Social History Narrative   Right handed   Caffeine  2-3 cups daily    Lives at home with husband    Social Drivers of Health   Financial Resource Strain: Low Risk  (07/08/2023)   Received from [facility]   Overall Financial Resource Strain (CARDIA)    Difficulty of Paying Living Expenses: Not hard at all  Food Insecurity: No Food Insecurity (11/30/2023)   Hunger Vital Sign    Worried About Running Out of Food in the Last Year: Never true    Ran Out of Food in the Last Year: Never true  Transportation Needs: No Transportation Needs (11/30/2023)   PRAPARE - Administrator, Civil Service (Medical): No    Lack of Transportation (Non-Medical): No  Physical Activity: Not on file  Stress: Not on file  Social Connections: Socially Integrated (11/30/2023)   Social Connection and Isolation Panel    Frequency of Communication with Friends and Family: Twice a week    Frequency of Social Gatherings with Friends and Family: Twice a week    Attends Religious Services: 1 to 4 times per year     Active Member of [employer] or Organizations: No    Attends Engineer, structural: 1 to 4 times per year    Marital Status: Married    Allergies  Allergen Reactions   Gallium (Radioactive Isotope) Swelling    Pt states she can not have radioactive injections.    Paxlovid [Nirmatrelvir-Ritonavir]      Current Outpatient Medications:    acetaminophen  (TYLENOL ) 325 MG tablet, Take 1-2 tablets (325-650 mg total) by mouth every 6 (six) hours as needed for mild pain or headache., Disp: , Rfl:    ascorbic acid  (VITAMIN C ) 500 MG tablet, Take 500 mg by mouth daily., Disp: , Rfl:    ASPIRIN  81 PO, Take 81 mg by mouth daily., Disp: , Rfl:    buPROPion  (WELLBUTRIN  XL) 150 MG 24 hr tablet, Take 1 tablet (150 mg total) by mouth daily., Disp: 30 tablet, Rfl: 0   busPIRone  (BUSPAR ) 7.5 MG tablet, Take 7.5  mg by mouth 2 (two) times daily., Disp: , Rfl:    Calcium  Carb-Cholecalciferol  (CALCIUM  600+D3) 600-20 MG-MCG TABS, Take 1 tablet by mouth in the morning and at bedtime., Disp: , Rfl:    carvedilol  (COREG ) 25 MG tablet, Take 1 tablet (25 mg total) by mouth 2 (two) times daily with a meal., Disp: 60 tablet, Rfl: 0   Cholecalciferol  (VITAMIN D3) 50 MCG (2000 UT) capsule, Take 2,000 Units by mouth daily., Disp: , Rfl:    cyclobenzaprine  (FLEXERIL ) 10 MG tablet, Take 10 mg by mouth 3 (three) times daily., Disp: , Rfl:    denosumab (PROLIA) 60 MG/ML SOSY injection, Inject 60 mg into the skin every 6 (six) months., Disp: , Rfl:    diphenoxylate -atropine  (LOMOTIL ) 2.5-0.025 MG tablet, Take 1 tablet by mouth 2 (two) times daily as needed for diarrhea or loose stools., Disp: 30 tablet, Rfl: 0   donepezil  (ARICEPT ) 10 MG tablet, Take 10 mg by mouth every morning., Disp: , Rfl:    doxycycline  (VIBRA -TABS) 100 MG tablet, Take 1 tablet (100 mg total) by mouth 2 (two) times daily., Disp: 60 tablet, Rfl: 11   empagliflozin  (JARDIANCE ) 10 MG TABS tablet, Take 1 tablet (10 mg total) by mouth daily before breakfast.,  Disp: 90 tablet, Rfl: 3   estradiol  (ESTRACE ) 0.1 MG/GM vaginal cream, Place 0.5g nightly for two weeks then twice a week after, Disp: 30 g, Rfl: 3   famotidine  (PEPCID ) 20 MG tablet, Take 1 tablet (20 mg total) by mouth 2 (two) times daily. (Patient taking differently: Take 20 mg by mouth every evening.), Disp: 60 tablet, Rfl: 0   fenoprofen (NALFON ) 600 MG TABS tablet, Take 600 mg by mouth in the morning and at bedtime., Disp: , Rfl:    fluvoxaMINE  (LUVOX ) 100 MG tablet, Take 1 tablet (100 mg total) by mouth at bedtime., Disp: 30 tablet, Rfl: 0   gabapentin  (NEURONTIN ) 100 MG capsule, Take 1 capsule (100 mg total) by mouth 3 (three) times daily., Disp: 90 capsule, Rfl: 0   Lactobacillus (ACIDOPHILUS) 100 MG CAPS, Take 1 capsule (100 mg total) by mouth 2 (two) times daily. (Patient taking differently: Take 250 mg by mouth daily.), Disp: 60 capsule, Rfl: 0   lamoTRIgine  (LAMICTAL ) 100 MG tablet, Take 200 mg by mouth at bedtime., Disp: , Rfl:    levothyroxine  (SYNTHROID ) 88 MCG tablet, Take 1 tablet (88 mcg total) by mouth daily before breakfast., Disp: 30 tablet, Rfl: 0   losartan  (COZAAR ) 25 MG tablet, Take 0.5 tablets (12.5 mg total) by mouth daily., Disp: , Rfl:    Melatonin 10 MG TABS, Take 10 mg by mouth at bedtime., Disp: , Rfl:    metFORMIN  (GLUCOPHAGE ) 500 MG tablet, Take 1 tablet (500 mg total) by mouth 2 (two) times daily with a meal., Disp: 60 tablet, Rfl: 0   Multiple Vitamin (MULTIVITAMIN) tablet, Take 1 tablet by mouth daily.  , Disp: , Rfl:    Multiple Vitamins-Minerals (ZINC PO), Take 50 mg by mouth daily., Disp: , Rfl:    nicotine  (NICODERM CQ  - DOSED IN MG/24 HOURS) 21 mg/24hr patch, Place 21 mg onto the skin daily., Disp: , Rfl:    nicotine  polacrilex (NICORETTE ) 4 MG gum, Take 4 mg by mouth as needed for smoking cessation., Disp: , Rfl:    ondansetron  (ZOFRAN ) 4 MG tablet, Take 1 tablet (4 mg total) by mouth every 8 (eight) hours as needed for refractory nausea / vomiting., Disp:  20 tablet, Rfl: 0   oxyCODONE  (OXY  IR/ROXICODONE ) 5 MG immediate release tablet, Take 1-3 tablets (5-15 mg total) by mouth every 4 (four) hours as needed for moderate pain (pain score 4-6), severe pain (pain score 7-10) or breakthrough pain (5mg  for moderate pain, 10mg  for severe pain)., Disp: 60 tablet, Rfl: 0   pantoprazole  (PROTONIX ) 40 MG tablet, Take 40 mg by mouth every morning., Disp: , Rfl:    potassium chloride  (KLOR-CON ) 10 MEQ tablet, Take 20 mEq by mouth 3 (three) times daily., Disp: , Rfl:    rosuvastatin  (CRESTOR ) 20 MG tablet, Take 20 mg by mouth daily., Disp: , Rfl:    sodium chloride  1 g tablet, Take 1 tablet (1 g total) by mouth 2 (two) times daily with a meal., Disp: 60 tablet, Rfl: 0   traZODone  (DESYREL ) 100 MG tablet, Take 1 tablet (100 mg total) by mouth at bedtime., Disp: 30 tablet, Rfl: 0   VRAYLAR  1.5 MG capsule, Take 1.5 mg by mouth at bedtime., Disp: , Rfl:    Review of Systems  Constitutional:  Negative for activity change, appetite change, chills, diaphoresis, fatigue, fever and unexpected weight change.  HENT:  Negative for congestion, rhinorrhea, sinus pressure, sneezing, sore throat and trouble swallowing.   Eyes:  Negative for photophobia and visual disturbance.  Respiratory:  Negative for cough, chest tightness, shortness of breath, wheezing and stridor.   Cardiovascular:  Negative for chest pain, palpitations and leg swelling.  Gastrointestinal:  Negative for abdominal distention, abdominal pain, anal bleeding, blood in stool, constipation, diarrhea, nausea and vomiting.  Genitourinary:  Negative for difficulty urinating, dysuria, flank pain and hematuria.  Musculoskeletal:  Positive for back pain. Negative for arthralgias, gait problem, joint swelling and myalgias.  Skin:  Negative for color change, pallor, rash and wound.  Neurological:  Negative for dizziness, tremors, weakness and light-headedness.  Hematological:  Negative for adenopathy. Does not  bruise/bleed easily.  Psychiatric/Behavioral:  Negative for agitation, behavioral problems, confusion, decreased concentration, dysphoric mood and sleep disturbance.        Objective:   Physical Exam Constitutional:      General: She is not in acute distress.    Appearance: Normal appearance. She is well-developed. She is not ill-appearing or diaphoretic.  HENT:     Head: Normocephalic and atraumatic.     Right Ear: Hearing and external ear normal.     Left Ear: Hearing and external ear normal.     Nose: No nasal deformity or rhinorrhea.   Eyes:     General: No scleral icterus.    Conjunctiva/sclera: Conjunctivae normal.     Right eye: Right conjunctiva is not injected.     Left eye: Left conjunctiva is not injected.   Neck:     Vascular: No JVD.   Cardiovascular:     Rate and Rhythm: Normal rate and regular rhythm.     Heart sounds: S1 normal and S2 normal.  Pulmonary:     Effort: Pulmonary effort is normal. No accessory muscle usage.     Breath sounds: No wheezing.  Abdominal:     General: Bowel sounds are normal. There is no distension.     Palpations: Abdomen is soft.     Tenderness: There is no abdominal tenderness.   Musculoskeletal:        General: Normal range of motion.     Right shoulder: Normal.     Left shoulder: Normal.     Cervical back: Normal range of motion and neck supple.     Right hip: Normal.  Left hip: Normal.     Right knee: Normal.     Left knee: Normal.  Lymphadenopathy:     Head:     Right side of head: No submandibular, preauricular or posterior auricular adenopathy.     Left side of head: No submandibular, preauricular or posterior auricular adenopathy.     Cervical: No cervical adenopathy.     Right cervical: No superficial or deep cervical adenopathy.    Left cervical: No superficial or deep cervical adenopathy.   Skin:    General: Skin is warm and dry.     Coloration: Skin is not pale.     Findings: No abrasion, bruising,  ecchymosis, erythema, lesion or rash.     Nails: There is no clubbing.   Neurological:     Mental Status: She is alert and oriented to person, place, and time.     Sensory: No sensory deficit.     Coordination: Coordination normal.     Gait: Gait normal.   Psychiatric:        Attention and Perception: She is attentive.        Mood and Affect: Mood normal.        Speech: Speech normal.        Behavior: Behavior normal. Behavior is cooperative.        Thought Content: Thought content normal.        Judgment: Judgment normal.          Assessment & Plan:   Assessment and Plan    Osteomyelitis/Diskitis  Chronic osteomyelitis with worsening back pain. MRI showed decreased edema in L4, L5. Differential includes infection versus fracture. Labs show improved inflammatory markers. Repeat biopsy not recommended due to low yield after prolonged antibiotics.  - Continue doxycycline  and Augmentin  for now --check ESR, CRP CBC and BMP --Reassess symptoms in two months. - Consider MRI if symptoms significantly worsen. - Discontinue doxycycline  temporarily during beach trip to avoid rash and assess if her pain worsens or stays the same while off of the doxycycline   Back pain Worsening back pain, possibly related to osteomyelitis.  Postoperative bleeding Bleeding from hysterectomy scar site managed with gauze.   Colovaginal fistula: sp repair and partial colectomy, cystoscopy

## 2024-01-09 ENCOUNTER — Ambulatory Visit: Admitting: Infectious Disease

## 2024-01-09 ENCOUNTER — Other Ambulatory Visit: Payer: Self-pay

## 2024-01-09 VITALS — BP 143/86 | HR 73 | Temp 98.3°F

## 2024-01-09 DIAGNOSIS — E119 Type 2 diabetes mellitus without complications: Secondary | ICD-10-CM

## 2024-01-09 DIAGNOSIS — S32009K Unspecified fracture of unspecified lumbar vertebra, subsequent encounter for fracture with nonunion: Secondary | ICD-10-CM

## 2024-01-09 DIAGNOSIS — M4646 Discitis, unspecified, lumbar region: Secondary | ICD-10-CM | POA: Diagnosis not present

## 2024-01-09 DIAGNOSIS — R339 Retention of urine, unspecified: Secondary | ICD-10-CM

## 2024-01-09 DIAGNOSIS — T847XXS Infection and inflammatory reaction due to other internal orthopedic prosthetic devices, implants and grafts, sequela: Secondary | ICD-10-CM

## 2024-01-09 DIAGNOSIS — M4645 Discitis, unspecified, thoracolumbar region: Secondary | ICD-10-CM

## 2024-01-09 DIAGNOSIS — M4626 Osteomyelitis of vertebra, lumbar region: Secondary | ICD-10-CM | POA: Diagnosis not present

## 2024-01-09 DIAGNOSIS — T8142XD Infection following a procedure, deep incisional surgical site, subsequent encounter: Secondary | ICD-10-CM

## 2024-01-09 DIAGNOSIS — N824 Other female intestinal-genital tract fistulae: Secondary | ICD-10-CM

## 2024-01-10 ENCOUNTER — Ambulatory Visit: Payer: Self-pay

## 2024-01-10 LAB — COMPLETE METABOLIC PANEL WITHOUT GFR
AG Ratio: 1.7 (calc) (ref 1.0–2.5)
ALT: 12 U/L (ref 6–29)
AST: 17 U/L (ref 10–35)
Albumin: 4.5 g/dL (ref 3.6–5.1)
Alkaline phosphatase (APISO): 65 U/L (ref 37–153)
BUN: 14 mg/dL (ref 7–25)
CO2: 23 mmol/L (ref 20–32)
Calcium: 9 mg/dL (ref 8.6–10.4)
Chloride: 105 mmol/L (ref 98–110)
Creat: 0.56 mg/dL (ref 0.50–1.05)
Globulin: 2.7 g/dL (ref 1.9–3.7)
Glucose, Bld: 108 mg/dL — ABNORMAL HIGH (ref 65–99)
Potassium: 4.2 mmol/L (ref 3.5–5.3)
Sodium: 137 mmol/L (ref 135–146)
Total Bilirubin: 0.4 mg/dL (ref 0.2–1.2)
Total Protein: 7.2 g/dL (ref 6.1–8.1)

## 2024-01-10 LAB — CBC WITH DIFFERENTIAL/PLATELET
Absolute Lymphocytes: 2224 {cells}/uL (ref 850–3900)
Absolute Monocytes: 676 {cells}/uL (ref 200–950)
Basophils Absolute: 98 {cells}/uL (ref 0–200)
Basophils Relative: 0.9 %
Eosinophils Absolute: 338 {cells}/uL (ref 15–500)
Eosinophils Relative: 3.1 %
HCT: 42 % (ref 35.0–45.0)
Hemoglobin: 13.4 g/dL (ref 11.7–15.5)
MCH: 29.4 pg (ref 27.0–33.0)
MCHC: 31.9 g/dL — ABNORMAL LOW (ref 32.0–36.0)
MCV: 92.1 fL (ref 80.0–100.0)
MPV: 8.4 fL (ref 7.5–12.5)
Monocytes Relative: 6.2 %
Neutro Abs: 7565 {cells}/uL (ref 1500–7800)
Neutrophils Relative %: 69.4 %
Platelets: 435 10*3/uL — ABNORMAL HIGH (ref 140–400)
RBC: 4.56 10*6/uL (ref 3.80–5.10)
RDW: 19.1 % — ABNORMAL HIGH (ref 11.0–15.0)
Total Lymphocyte: 20.4 %
WBC: 10.9 10*3/uL — ABNORMAL HIGH (ref 3.8–10.8)

## 2024-01-10 LAB — SEDIMENTATION RATE: Sed Rate: 9 mm/h (ref 0–30)

## 2024-01-10 LAB — C-REACTIVE PROTEIN: CRP: 4.8 mg/L (ref <8.0)

## 2024-01-25 ENCOUNTER — Telehealth: Payer: Self-pay

## 2024-01-25 NOTE — Telephone Encounter (Signed)
 Copied from CRM [id number]. Topic: General - Other >> Jan 23, 2024 10:03 AM Rilla B wrote: Reason for CRM: Patient called in to schedule appt with Dr Shelah. Attempted to schedule appt in September. Patient refused September appt.  States she is having a CT of the lungs because Dr saw a spot and she cannot wait a couple months for her results. Patient had a couple additional questions regarding her CT and the length of time for results. Patient would like a call from the office.SABRA  ATCx1- went straight to vm. Left detailed message per DPR to call back.

## 2024-01-26 ENCOUNTER — Ambulatory Visit: Attending: Physical Medicine & Rehabilitation | Admitting: Physical Therapy

## 2024-01-26 ENCOUNTER — Other Ambulatory Visit: Payer: Self-pay

## 2024-01-26 DIAGNOSIS — R2689 Other abnormalities of gait and mobility: Secondary | ICD-10-CM | POA: Insufficient documentation

## 2024-01-26 DIAGNOSIS — R269 Unspecified abnormalities of gait and mobility: Secondary | ICD-10-CM | POA: Insufficient documentation

## 2024-01-26 DIAGNOSIS — M6281 Muscle weakness (generalized): Secondary | ICD-10-CM | POA: Diagnosis present

## 2024-01-26 DIAGNOSIS — R2681 Unsteadiness on feet: Secondary | ICD-10-CM | POA: Insufficient documentation

## 2024-01-26 NOTE — Therapy (Signed)
 OUTPATIENT PHYSICAL THERAPY NEURO EVALUATION   Patient Name: [name] MRN: [id number] DOB:[birth year], 68 y.o., female Today's Date: 01/27/2024   PCP: Rolinda Millman, MD  REFERRING PROVIDER: Urbano Albright, MD   END OF SESSION:  PT End of Session - 01/26/24 1708     Visit Number 1    Number of Visits 17    Date for PT Re-Evaluation 03/23/24    Authorization Type BCBS Medicare    Progress Note Due on Visit 10    PT Start Time 1617    PT Stop Time 1700    PT Time Calculation (min) 43 min    Equipment Utilized During Treatment Gait belt    Activity Tolerance Patient tolerated treatment well    Behavior During Therapy WFL for tasks assessed/performed          Past Medical History:  Diagnosis Date   Anemia    Anxiety    Arthritis    Bipolar disorder (HCC)    Cardiomyopathy (HCC)    Complication of anesthesia    Awake but could not move   Depression    Diskitis 10/04/2023   GERD (gastroesophageal reflux disease)    Heart failure (HCC)    Acute   Heart murmur    related to VSD   High cholesterol    History of blood transfusion    related to OR (08/19/2016)   History of hiatal hernia    Hypertension    Hyperthyroidism    Hypotension 09/06/2023   Mild cognitive impairment 09/06/2018   Paroxysmal ventricular tachycardia (HCC)    Pneumonia    Rectovaginal fistula 09/06/2023   Tachycardia, unspecified    Type II diabetes mellitus (HCC)    UTI (urinary tract infection)    being treated with Keflex    Ventricular septal defect    Ventriculitis of brain due to bacteria 11/02/2022   Vertebral fracture, osteoporotic (HCC) 01/06/2023   Past Surgical History:  Procedure Laterality Date   ABDOMINAL HYSTERECTOMY     BACK SURGERY     CARDIAC CATHETERIZATION N/A 06/23/2015   Procedure: Left Heart Cath and Coronary Angiography;  Surgeon: Ezra GORMAN Shuck, MD;  Location: [facility] INVASIVE CV LAB;  Service: Cardiovascular;  Laterality: N/A;   CARDIAC  CATHETERIZATION  1960   VSD was so small; didn't need repaired   COLECTOMY, SIGMOID, ROBOT-ASSISTED N/A 11/30/2023   Procedure: COLECTOMY, SIGMOID, ROBOT-ASSISTED ROBOTIC LOW ANTERIOR RECTOSIGMOID RESECTION (LAR) WITH ANASTOMOSIS COLOVAGINAL FISTULA TAKEDOWN & REPAIR OMENTOPEXY OF VAGINA INTRAOPERATIVE ASSESSMENT OF TISSUE VASCULAR PERFUSION USING ICG (indocyanine green ) IMMUNOFLUORESCENCE TRANSVERSUS ABDOMINIS PLANE (TAP) BLOCK - BILATERAL;  Surgeon: Sheldon Standing, MD;  Location: [facility] ORS;  Service: General;  Late   CYSTOSCOPY WITH INDOCYANINE GREEN  IMAGING (ICG) N/A 11/30/2023   Procedure: Cystoscopy with bilateral ureteral FireFly injections Transurethral resection of bladder tumor--large;  Surgeon: Carolee Sherwood JONETTA DOUGLAS, MD;  Location: [facility];  Service: Urology;  Laterality: N/A;   EXAM UNDER ANESTHESIA WITH MANIPULATION OF HIP Right 06/02/2014   dr sheril   FLEXIBLE SIGMOIDOSCOPY N/A 11/30/2023   Procedure: KINGSTON SIDE;  Surgeon: Sheldon Standing, MD;  Location: [facility];  Service: General;  Laterality: N/A;   FRACTURE SURGERY     HERNIA REPAIR     HIP CLOSED REDUCTION Right 06/02/2014   Procedure: CLOSED MANIPULATION HIP;  Surgeon: Maude KANDICE sheril, MD;  Location: [facility] OR;  Service: Orthopedics;  Laterality: Right;   IR FLUORO GUIDED NEEDLE PLC ASPIRATION/INJECTION LOC  08/09/2023   JOINT REPLACEMENT  JOINT REPLACEMENT     POSTERIOR LUMBAR FUSION 4 LEVEL N/A 06/26/2022   Procedure: Lumbar One To Lumbar Five Posterior Instrumented Fusion;  Surgeon: Cheryle Debby LABOR, MD;  Location: [facility] OR;  Service: Neurosurgery;  Laterality: N/A;   REFRACTIVE SURGERY Bilateral    SHOULDER ARTHROSCOPY Right    SHOULDER OPEN ROTATOR CUFF REPAIR Right    SPINAL FUSION  1996   t10 down to my coccyx   SPINE HARDWARE REMOVAL     TOTAL ABDOMINAL HYSTERECTOMY     TOTAL HIP ARTHROPLASTY Right 05/10/2014   [place]      by dr lonni olcott   TOTAL KNEE ARTHROPLASTY Left    TOTAL SHOULDER  ARTHROPLASTY Left 08/19/2016   Procedure: TOTAL SHOULDER ARTHROPLASTY;  Surgeon: Eva Herring, MD;  Location: [facility] OR;  Service: Orthopedics;  Laterality: Left;  Left total shoulder replacement   Patient Active Problem List   Diagnosis Date Noted   Colovaginal fistula 11/30/2023   Diskitis 10/04/2023   Hypotension 09/06/2023   Fracture of tenth thoracic vertebra (HCC) 09/05/2023   History of discitis 09/05/2023   Senile osteoporosis 09/05/2023   History of urinary retention 09/05/2023   Vaginal atrophy 09/05/2023   Nocturia 09/05/2023   Vulvar ulcer 09/05/2023   Pulmonary nodule 08/02/2023   Non-traumatic compression fracture of vertebral column (HCC) 06/24/2023   Nonobstructive atherosclerosis of coronary artery 02/10/2023   Bilateral pseudophakia 02/03/2023   Vertebral fracture, osteoporotic (HCC) 01/06/2023   Ventriculitis of brain due to bacteria 11/02/2022   Anemia of chronic illness 10/12/2022   Bipolar disorder, in partial remission, most recent episode depressed (HCC) 10/07/2022   Adjustment disorder with mixed anxiety and depressed mood 09/30/2022   Chronic wound 09/25/2022   Hyperglycemia 09/25/2022   Chronic midline thoracic back pain 09/17/2022   Primary insomnia 09/17/2022   Chronic combined systolic and diastolic CHF (congestive heart failure) (HCC) 09/14/2022   Acute blood loss anemia 09/09/2022   Infection of lumbar spine (HCC) 09/03/2022   Malnutrition of moderate degree 08/20/2022   Hardware complicating wound infection (HCC) 08/20/2022   Infection of deep incisional surgical site after procedure 08/20/2022   Aspiration pneumonia of both lungs (HCC) 08/19/2022   Acute respiratory failure with hypoxia (HCC) 08/19/2022   Sepsis (HCC) 08/19/2022   Seizure (HCC) 08/19/2022   Wound dehiscence 08/09/2022   Delayed surgical wound healing 07/08/2022   Fracture of lumbar spine without cord injury (HCC) 06/26/2022   Lumbar vertebral fracture (HCC) 06/25/2022   Mood  disorder (HCC) 09/29/2021   Generalized muscle weakness 04/30/2019   Mild cognitive impairment 09/06/2018   Hyponatremia 02/28/2018   S/P shoulder replacement, left 08/19/2016   Anxiety 10/17/2014   Acid reflux 10/17/2014   BP (high blood pressure) 10/17/2014   Arthritis, degenerative 10/17/2014   Adult hypothyroidism 10/17/2014   UTI (urinary tract infection) 06/03/2014   Fracture of bone adjacent to prosthesis 06/03/2014   Peri-prosthetic fracture of femur following total hip arthroplasty 06/02/2014   Urinary retention 05/13/2014   Chronic pain 05/10/2014   History of hip surgery 05/10/2014   Osteoarthritis 09/30/2011   UNSPECIFIED HEART FAILURE 06/24/2010   UNSPECIFIED CONGENITAL DEFECT OF SEPTAL CLOSURE 06/24/2010   Secondary cardiomyopathy (HCC) 03/18/2010   Tobacco use 03/18/2010   DM 06/13/2009   HYPERTENSION, UNSPECIFIED 06/13/2009   VENTRICULAR TACHYCARDIA 06/13/2009   VENTRICULAR SEPTAL DEFECT, CONGENITAL 06/13/2009   Essential (primary) hypertension 06/13/2009   Diabetes mellitus type 2, noninsulin dependent (HCC) 06/13/2009    ONSET DATE: 01/05/2024 MD referral  REFERRING DIAG:  R26.9 (ICD-10-CM) - Gait difficulty   THERAPY DIAG:  Muscle weakness (generalized)  Unsteadiness on feet  Other abnormalities of gait and mobility  Rationale for Evaluation and Treatment: Rehabilitation  SUBJECTIVE:                                                                                                                                                                                             SUBJECTIVE STATEMENT: I'm not strong, I'm not very balanced.   Pt accompanied by: self  PERTINENT HISTORY: Chronic osteomyelitis with worsening back pain; Hx of multiple R hip fractures with surgery to revisions, significant leg length difference with LLE longer than RLE, hx of infection post surgery in spine (treated on antibiotics) Per MD Note 01/09/2024:    Patient was seen by Dr.  Elspeth Schultze who scheduled her for surgery and on  12/01/2023 she underwent   -ROBOTIC LOW ANTERIOR RECTOSIGMOID RESECTION (LAR) WITH ANASTOMOSIS -COLOVAGINAL FISTULA TAKEDOWN & REPAIR -OMENTOPEXY OF VAGINA -INTRAOPERATIVE ASSESSMENT OF TISSUE VASCULAR PERFUSION USING ICG (indocyanine green ) IMMUNOFLUORESCENCE -TRANSVERSUS ABDOMINIS PLANE (TAP) BLOCK - BILATERAL -FLEXIBLE SIGMOIDOSCOPY    Urology did joint surgery and did    1.  Cystoscopy with bilateral ureteral FireFly injections 2.  Transurethral resection of  possible bladder tumor--large   Pathology came back with:   A. BLADDER TUMOR, TRANSURETHERAL RESECTON:  Florid cystitis cystica and papillary cystitis  Negative for carcinoma   B. RECTOSIGMOID COLON RESECTION:  Diverticula with chronic inflammation, perforation, fistula and reactive  fibrosis  Negative for carcinoma  Five benign lymph nodes (0/5)   C. FINAL DISTAL MARGIN:  Benign colonic tissue          PAIN:  Are you having pain? Yes: NPRS scale: 6/10 Pain location: R hip Pain description: throbbing Aggravating factors: more activities Relieving factors: rest  PRECAUTIONS: Fall and Other: Recent colectomy-pt reports no restrictions Pt has history of osteoporosis and compression fractures RED FLAGS: None   WEIGHT BEARING RESTRICTIONS: No  FALLS: Has patient fallen in last 6 months? No  LIVING ENVIRONMENT: Lives with: lives with their family Lives in: House/apartment Stairs: long ramp in/out of home Has following equipment at home: Vannie - 2 wheeled and Wheelchair (manual)  PLOF: Needs assistance with gait and Vocation/Vocational requirements: retired Runner, broadcasting/film/video, Tree surgeon; enjoys spending time with grandchildren in [place] and goes to [employer] weekly  PATIENT GOALS: To get stronger, to get back on a cane.  To use walker to get to the car (long ramp), get RLE into car better  OBJECTIVE:  Note: Objective measures were completed at Evaluation unless  otherwise noted.  DIAGNOSTIC FINDINGS: MRI 09/2023  IMPRESSION: 1. Postoperative changes of T11-S1 laminectomy and posterior spinal fusion with right posterior spinal instrumentation from L2-L5. On prior CT, there is evidence of fracture through the left posterior fusion mass at L4-5, correlating with edema is seen in the left pedicle of L4 on today's study. 2. Decreased edema within the L4 and L5 vertebral bodies and posterior elements, as well as within the L4-5 and L3-4 discs, likely reactive and related to the above fracture. 3. Loculated simple fluid within the thecal sac with some marginalized cauda equina nerve roots, most consistent with arachnoiditis. 4. No spinal canal stenosis or neural foraminal narrowing.  COGNITION: Overall cognitive status: History of cognitive impairments - at baseline   SENSATION: Light touch: Impaired  and to light touch-see below Reports some numbness L foot  POSTURE: Leg length diffierence  LOWER EXTREMITY ROM:     Active  Right Eval Left Eval  Hip flexion    Hip extension    Hip abduction    Hip adduction    Hip internal rotation    Hip external rotation    Knee flexion    Knee extension    Ankle dorsiflexion    Ankle plantarflexion    Ankle inversion    Ankle eversion     (Blank rows = not tested)  LOWER EXTREMITY MMT:    MMT Right Eval Left Eval  Hip flexion 3- 3+  Hip extension    Hip abduction 4 4  Hip adduction 4 4  Hip internal rotation    Hip external rotation    Knee flexion 4 4  Knee extension 3+ 4  Ankle dorsiflexion 3+ 3+  Ankle plantarflexion    Ankle inversion    Ankle eversion    (Blank rows = not tested)   TRANSFERS: Sit to stand: SBA  Assistive device utilized: Environmental consultant - 2 wheeled     Stand to sit: SBA  Assistive device utilized: Environmental consultant - 2 wheeled      GAIT: Findings: Gait Characteristics: excess lateral weightshift through trunk due to significant leg length difference and step through  pattern, Distance walked: 50 ft, Assistive device utilized:Walker - 2 wheeled, Level of assistance: CGA, and Comments: NA  FUNCTIONAL TESTS:  5 times sit to stand: 14.44 sec with BUE support Timed up and go (TUG): 24.53 sec 2 minute walk test: NT 10 meter walk test: 21.34 sec (1.54 ft/sec) TUG cognitive: (naming colors) 28.62 sec 43M walk back:  42.16 sec with RW Stroop gait:  13.25 sec in 10 ft (naming color words)-0.75 ft/sec   18.75 sec in 10 feet (naming printed colors)-0.53 ft/sec                                                                                                                               TREATMENT DATE: 01/26/2024    PATIENT EDUCATION: Education details: Eval results, POC Person educated: Patient Education method: Explanation Education comprehension: verbalized understanding  HOME EXERCISE PROGRAM:  Not yet initiated Did perform eccentric hip flexion/extension in sitting  GOALS: Goals reviewed with patient? Yes  SHORT TERM GOALS: Target date: 02/24/2024  Pt will be independent with HEP for improved strength, balance, gait. Baseline: Goal status: INITIAL  2.  Pt will improve TUG score to less than or equal to 18 sec for decreased fall risk. Baseline: 24.53 sec Goal status: INITIAL  3.  Pt will improve 3 M Walk backwards to less than or equal to 30 seconds for decreased fall risk. Baseline: 42.16 sec Goal status: INITIAL  4.  Pt will report at least 50% improvement in car transfers.  Baseline:  difficulty with RLE  Goal status:  INITIAL  LONG TERM GOALS: Target date: 03/23/2024  Pt will be independent with HEP for improved strength, balance, gait. Baseline:  Goal status: INITIAL  2.  Pt will improve gait velocity to at least 1.8 ft/sec for improved gait efficiency and safety. Baseline: 1.54 ft/sec Goal status: INITIAL  3.  Pt will improve gait velocity with dual task to at least 1 ft/sec for improved gait and decreased fall risk. Baseline:  0.53-0.75 ft/sec with Stroop over 10 ft Goal status: INITIAL  4.  Pt will improve 5x sit<>stand to less than or equal to 11.5 sec with light UE support to demonstrate improved functional strength and transfer efficiency. Baseline: 14.44 sec Goal status: INITIAL  5.  Pt will verbalize plans for continued community fitness upon d/c from PT to maximize gains made in PT. Baseline:  Goal status: INITIAL   ASSESSMENT:  CLINICAL IMPRESSION: Patient is a 68 y.o. female who was seen today for physical therapy evaluation and treatment for gait difficulty.  She has extensive medical history for multiple revisions of R hip fracture and thoraco-lumbar surgeries with infections.  She is continuing on antibiotics and being monitored.  She is known to this clinic for therapy approximately 9-12 months ago.  During that time, she had Covid, continued question of infection in spine with pain in back, and underwent colectomy in May 2025.  She presents today to work on strength and balance for better mobility.  She presents with decreased strength (LLE is stronger than RLE), decreased balance, decreased independence with transfers and gait.  She has slowed mobility and is at increased fall risk with gait velocity, TUG, FTSTS, 3 M walk backwards and dual task gait activities.  She will benefit from skilled PT to address the above stated deficits to decrease fall risk and improve overall functional mobility.   OBJECTIVE IMPAIRMENTS: Abnormal gait, decreased balance, decreased mobility, difficulty walking, decreased strength, impaired sensation, postural dysfunction, and pain.   ACTIVITY LIMITATIONS: sitting, standing, transfers, locomotion level, and caring for others  PARTICIPATION LIMITATIONS: driving, shopping, community activity, and travel  PERSONAL FACTORS: 3+ comorbidities: see PMH above are also affecting patient's functional outcome.   REHAB POTENTIAL: Good  CLINICAL DECISION MAKING: Evolving/moderate  complexity  EVALUATION COMPLEXITY: Moderate  PLAN:  PT FREQUENCY: 2x/week  PT DURATION: 8 weeks plus eval visit  PLANNED INTERVENTIONS: 97750- Physical Performance Testing, 97110-Therapeutic exercises, 97530- Therapeutic activity, 97112- Neuromuscular re-education, 97535- Self Care, [id number]- Manual therapy, [phone]- Gait training, Patient/Family education, and Balance training  PLAN FOR NEXT SESSION: Initiate HEP-lower extremity strengthening, standing and gait tolerance; RLE strength for car transfers   Nicholaus Steinke W., PT 01/27/2024, 12:00 PM  [facility] [address], [address] [place], [place] [address] Phone # [phone] Fax # [phone]

## 2024-01-26 NOTE — Telephone Encounter (Signed)
 Copied from CRM [id number]. Topic: General - Other >> Jan 25, 2024  9:17 AM Isabell A wrote: Patient returning missed phone call from [facility], please call patient.   ATC x1.  Sending Mychart msg

## 2024-01-30 ENCOUNTER — Ambulatory Visit: Admitting: Physical Therapy

## 2024-01-30 DIAGNOSIS — M6281 Muscle weakness (generalized): Secondary | ICD-10-CM

## 2024-01-30 DIAGNOSIS — R2689 Other abnormalities of gait and mobility: Secondary | ICD-10-CM

## 2024-01-30 DIAGNOSIS — R2681 Unsteadiness on feet: Secondary | ICD-10-CM

## 2024-01-30 NOTE — Therapy (Signed)
 OUTPATIENT PHYSICAL THERAPY NEURO TREATMENT NOTE   Patient Name: [name] MRN: [id number] DOB:February 22, 1956, 68 y.o., female Today's Date: 01/30/2024   PCP: Rolinda Millman, MD  REFERRING PROVIDER: Urbano Albright, MD   END OF SESSION:  PT End of Session - 01/30/24 1621     Visit Number 2    Number of Visits 17    Date for PT Re-Evaluation 03/23/24    Authorization Type BCBS Medicare    Progress Note Due on Visit 10    PT Start Time 1621    PT Stop Time 1656    PT Time Calculation (min) 35 min    Equipment Utilized During Treatment --    Activity Tolerance Patient tolerated treatment well    Behavior During Therapy WFL for tasks assessed/performed           Past Medical History:  Diagnosis Date   Anemia    Anxiety    Arthritis    Bipolar disorder (HCC)    Cardiomyopathy (HCC)    Complication of anesthesia    Awake but could not move   Depression    Diskitis 10/04/2023   GERD (gastroesophageal reflux disease)    Heart failure (HCC)    Acute   Heart murmur    related to VSD   High cholesterol    History of blood transfusion    related to OR (08/19/2016)   History of hiatal hernia    Hypertension    Hyperthyroidism    Hypotension 09/06/2023   Mild cognitive impairment 09/06/2018   Paroxysmal ventricular tachycardia (HCC)    Pneumonia    Rectovaginal fistula 09/06/2023   Tachycardia, unspecified    Type II diabetes mellitus (HCC)    UTI (urinary tract infection)    being treated with Keflex    Ventricular septal defect    Ventriculitis of brain due to bacteria 11/02/2022   Vertebral fracture, osteoporotic (HCC) 01/06/2023   Past Surgical History:  Procedure Laterality Date   ABDOMINAL HYSTERECTOMY     BACK SURGERY     CARDIAC CATHETERIZATION N/A 06/23/2015   Procedure: Left Heart Cath and Coronary Angiography;  Surgeon: Ezra GORMAN Shuck, MD;  Location: [facility] INVASIVE CV LAB;  Service: Cardiovascular;  Laterality: N/A;   CARDIAC  CATHETERIZATION  1960   VSD was so small; didn't need repaired   COLECTOMY, SIGMOID, ROBOT-ASSISTED N/A 11/30/2023   Procedure: COLECTOMY, SIGMOID, ROBOT-ASSISTED ROBOTIC LOW ANTERIOR RECTOSIGMOID RESECTION (LAR) WITH ANASTOMOSIS COLOVAGINAL FISTULA TAKEDOWN & REPAIR OMENTOPEXY OF VAGINA INTRAOPERATIVE ASSESSMENT OF TISSUE VASCULAR PERFUSION USING ICG (indocyanine green ) IMMUNOFLUORESCENCE TRANSVERSUS ABDOMINIS PLANE (TAP) BLOCK - BILATERAL;  Surgeon: Sheldon Standing, MD;  Location: [facility];  Service: General;  Late   CYSTOSCOPY WITH INDOCYANINE GREEN  IMAGING (ICG) N/A 11/30/2023   Procedure: Cystoscopy with bilateral ureteral FireFly injections Transurethral resection of bladder tumor--large;  Surgeon: Carolee Sherwood JONETTA DOUGLAS, MD;  Location: [facility];  Service: Urology;  Laterality: N/A;   EXAM UNDER ANESTHESIA WITH MANIPULATION OF HIP Right 06/02/2014   dr sheril   FLEXIBLE SIGMOIDOSCOPY N/A 11/30/2023   Procedure: KINGSTON SIDE;  Surgeon: Sheldon Standing, MD;  Location: [facility];  Service: General;  Laterality: N/A;   FRACTURE SURGERY     HERNIA REPAIR     HIP CLOSED REDUCTION Right 06/02/2014   Procedure: CLOSED MANIPULATION HIP;  Surgeon: Maude KANDICE sheril, MD;  Location: [facility];  Service: Orthopedics;  Laterality: Right;   IR FLUORO GUIDED NEEDLE PLC ASPIRATION/INJECTION LOC  08/09/2023   JOINT REPLACEMENT  JOINT REPLACEMENT     POSTERIOR LUMBAR FUSION 4 LEVEL N/A 06/26/2022   Procedure: Lumbar One To Lumbar Five Posterior Instrumented Fusion;  Surgeon: Cheryle Debby LABOR, MD;  Location: [facility];  Service: Neurosurgery;  Laterality: N/A;   REFRACTIVE SURGERY Bilateral    SHOULDER ARTHROSCOPY Right    SHOULDER OPEN ROTATOR CUFF REPAIR Right    SPINAL FUSION  1996   t10 down to my coccyx   SPINE HARDWARE REMOVAL     TOTAL ABDOMINAL HYSTERECTOMY     TOTAL HIP ARTHROPLASTY Right 05/10/2014   [place]      by dr lonni olcott   TOTAL KNEE ARTHROPLASTY Left    TOTAL SHOULDER  ARTHROPLASTY Left 08/19/2016   Procedure: TOTAL SHOULDER ARTHROPLASTY;  Surgeon: Eva Herring, MD;  Location: [facility];  Service: Orthopedics;  Laterality: Left;  Left total shoulder replacement   Patient Active Problem List   Diagnosis Date Noted   Colovaginal fistula 11/30/2023   Diskitis 10/04/2023   Hypotension 09/06/2023   Fracture of tenth thoracic vertebra (HCC) 09/05/2023   History of discitis 09/05/2023   Senile osteoporosis 09/05/2023   History of urinary retention 09/05/2023   Vaginal atrophy 09/05/2023   Nocturia 09/05/2023   Vulvar ulcer 09/05/2023   Pulmonary nodule 08/02/2023   Non-traumatic compression fracture of vertebral column (HCC) 06/24/2023   Nonobstructive atherosclerosis of coronary artery 02/10/2023   Bilateral pseudophakia 02/03/2023   Vertebral fracture, osteoporotic (HCC) 01/06/2023   Ventriculitis of brain due to bacteria 11/02/2022   Anemia of chronic illness 10/12/2022   Bipolar disorder, in partial remission, most recent episode depressed (HCC) 10/07/2022   Adjustment disorder with mixed anxiety and depressed mood 09/30/2022   Chronic wound 09/25/2022   Hyperglycemia 09/25/2022   Chronic midline thoracic back pain 09/17/2022   Primary insomnia 09/17/2022   Chronic combined systolic and diastolic CHF (congestive heart failure) (HCC) 09/14/2022   Acute blood loss anemia 09/09/2022   Infection of lumbar spine (HCC) 09/03/2022   Malnutrition of moderate degree 08/20/2022   Hardware complicating wound infection (HCC) 08/20/2022   Infection of deep incisional surgical site after procedure 08/20/2022   Aspiration pneumonia of both lungs (HCC) 08/19/2022   Acute respiratory failure with hypoxia (HCC) 08/19/2022   Sepsis (HCC) 08/19/2022   Seizure (HCC) 08/19/2022   Wound dehiscence 08/09/2022   Delayed surgical wound healing 07/08/2022   Fracture of lumbar spine without cord injury (HCC) 06/26/2022   Lumbar vertebral fracture (HCC) 06/25/2022   Mood  disorder (HCC) 09/29/2021   Generalized muscle weakness 04/30/2019   Mild cognitive impairment 09/06/2018   Hyponatremia 02/28/2018   S/P shoulder replacement, left 08/19/2016   Anxiety 10/17/2014   Acid reflux 10/17/2014   BP (high blood pressure) 10/17/2014   Arthritis, degenerative 10/17/2014   Adult hypothyroidism 10/17/2014   UTI (urinary tract infection) 06/03/2014   Fracture of bone adjacent to prosthesis 06/03/2014   Peri-prosthetic fracture of femur following total hip arthroplasty 06/02/2014   Urinary retention 05/13/2014   Chronic pain 05/10/2014   History of hip surgery 05/10/2014   Osteoarthritis 09/30/2011   UNSPECIFIED HEART FAILURE 06/24/2010   UNSPECIFIED CONGENITAL DEFECT OF SEPTAL CLOSURE 06/24/2010   Secondary cardiomyopathy (HCC) 03/18/2010   Tobacco use 03/18/2010   DM 06/13/2009   HYPERTENSION, UNSPECIFIED 06/13/2009   VENTRICULAR TACHYCARDIA 06/13/2009   VENTRICULAR SEPTAL DEFECT, CONGENITAL 06/13/2009   Essential (primary) hypertension 06/13/2009   Diabetes mellitus type 2, noninsulin dependent (HCC) 06/13/2009    ONSET DATE: 01/05/2024 MD referral  REFERRING DIAG:  R26.9 (ICD-10-CM) - Gait difficulty   THERAPY DIAG:  Muscle weakness (generalized)  Unsteadiness on feet  Other abnormalities of gait and mobility  Rationale for Evaluation and Treatment: Rehabilitation  SUBJECTIVE:                                                                                                                                                                                             SUBJECTIVE STATEMENT: Very tired from the weekend.  Did a lot while in the mountains  Pt accompanied by: self  PERTINENT HISTORY: Chronic osteomyelitis with worsening back pain; Hx of multiple R hip fractures with surgery to revisions, significant leg length difference with LLE longer than RLE, hx of infection post surgery in spine (treated on antibiotics) Per MD Note 01/09/2024:     Patient was seen by Dr. Elspeth Schultze who scheduled her for surgery and on  12/01/2023 she underwent   -ROBOTIC LOW ANTERIOR RECTOSIGMOID RESECTION (LAR) WITH ANASTOMOSIS -COLOVAGINAL FISTULA TAKEDOWN & REPAIR -OMENTOPEXY OF VAGINA -INTRAOPERATIVE ASSESSMENT OF TISSUE VASCULAR PERFUSION USING ICG (indocyanine green ) IMMUNOFLUORESCENCE -TRANSVERSUS ABDOMINIS PLANE (TAP) BLOCK - BILATERAL -FLEXIBLE SIGMOIDOSCOPY    Urology did joint surgery and did    1.  Cystoscopy with bilateral ureteral FireFly injections 2.  Transurethral resection of  possible bladder tumor--large   Pathology came back with:   A. BLADDER TUMOR, TRANSURETHERAL RESECTON:  Florid cystitis cystica and papillary cystitis  Negative for carcinoma   B. RECTOSIGMOID COLON RESECTION:  Diverticula with chronic inflammation, perforation, fistula and reactive  fibrosis  Negative for carcinoma  Five benign lymph nodes (0/5)   C. FINAL DISTAL MARGIN:  Benign colonic tissue          PAIN:  Are you having pain? Yes: NPRS scale: 6+/10 Pain location: R hip Pain description: throbbing, grinding, sharp Aggravating factors: more activities, walking a lot in the mountains Relieving factors: rest  PRECAUTIONS: Fall and Other: Recent colectomy-pt reports no restrictions Pt has history of osteoporosis and compression fractures RED FLAGS: None   WEIGHT BEARING RESTRICTIONS: No  FALLS: Has patient fallen in last 6 months? No  LIVING ENVIRONMENT: Lives with: lives with their family Lives in: House/apartment Stairs: long ramp in/out of home Has following equipment at home: Vannie - 2 wheeled and Wheelchair (manual)  PLOF: Needs assistance with gait and Vocation/Vocational requirements: retired Runner, broadcasting/film/video, Tree surgeon; enjoys spending time with grandchildren in [place] and goes to [employer] weekly  PATIENT GOALS: To get stronger, to get back on a cane.  To use walker to get to the car (long ramp), get RLE into car  better  OBJECTIVE:  TODAY'S TREATMENT: 01/30/2024 Activity Comments  Seated RLE hip flexion 2 x 10 reps 1st rep with resistance provided in eccentric phase, to try to recruit more concentric hip flexion; fatigues easily   Seated RLE hip abduction 2 x 10 Slide on pillow case, 3# at ankle  Seated hip adduction 2 x 10, 3 hold Ball squeeze  Resisted hip abduction 10 reps Green band  Sit to stand with resisted band at knees, 2 x 5 reps Green band  Simulated lifting RLE in and out of car threshold Using gait belt to assist RLE lift in/out, hip adduction/abduction  Gait with clinic rollator, 100 ft in 1:30 Walker may be too high-pt pushes out far in front with turns   Access Code: [email or web address] URL: [email or web address] Date: 01/30/2024 Prepared by: [facility] - Outpatient  Rehab - [facility]  Program Notes Seated leg slides out and in;  have a pillow case under your right foot-slide your foot out and in, 3 x 10 reps.  You can have a 3-5# ankle weight on your ankle  Exercises - Seated Hip Adduction Isometrics with Ball  - 1 x daily - 7 x weekly - 3 sets - 10 reps - Seated Hip Abduction with Resistance  - 1 x daily - 7 x weekly - 3 sets - 10 reps - Sit to Stand with Resistance Around Legs  - 1 x daily - 7 x weekly - 3 sets - 5 reps  PATIENT EDUCATION: Education details: HEP initiated Person educated: Patient Education method: Explanation, Demonstration, and Handouts Education comprehension: verbalized understanding, returned demonstration, and needs further education  --------------------------------------------------- Note: Objective measures were completed at Evaluation unless otherwise noted.  DIAGNOSTIC FINDINGS: MRI 09/2023   IMPRESSION: 1. Postoperative changes of T11-S1 laminectomy and posterior spinal fusion with right posterior spinal instrumentation from L2-L5. On prior CT, there is evidence of fracture through the left posterior fusion mass at L4-5,  correlating with edema is seen in the left pedicle of L4 on today's study. 2. Decreased edema within the L4 and L5 vertebral bodies and posterior elements, as well as within the L4-5 and L3-4 discs, likely reactive and related to the above fracture. 3. Loculated simple fluid within the thecal sac with some marginalized cauda equina nerve roots, most consistent with arachnoiditis. 4. No spinal canal stenosis or neural foraminal narrowing.  COGNITION: Overall cognitive status: History of cognitive impairments - at baseline   SENSATION: Light touch: Impaired  and to light touch-see below Reports some numbness L foot  POSTURE: Leg length diffierence  LOWER EXTREMITY ROM:     Active  Right Eval Left Eval  Hip flexion    Hip extension    Hip abduction    Hip adduction    Hip internal rotation    Hip external rotation    Knee flexion    Knee extension    Ankle dorsiflexion    Ankle plantarflexion    Ankle inversion    Ankle eversion     (Blank rows = not tested)  LOWER EXTREMITY MMT:    MMT Right Eval Left Eval  Hip flexion 3- 3+  Hip extension    Hip abduction 4 4  Hip adduction 4 4  Hip internal rotation    Hip external rotation    Knee flexion 4 4  Knee extension 3+ 4  Ankle dorsiflexion 3+ 3+  Ankle plantarflexion    Ankle inversion    Ankle eversion    (Blank rows = not tested)  TRANSFERS: Sit to stand: SBA  Assistive device utilized: Environmental consultant - 2 wheeled     Stand to sit: SBA  Assistive device utilized: Environmental consultant - 2 wheeled      GAIT: Findings: Gait Characteristics: excess lateral weightshift through trunk due to significant leg length difference and step through pattern, Distance walked: 50 ft, Assistive device utilized:Walker - 2 wheeled, Level of assistance: CGA, and Comments: NA  FUNCTIONAL TESTS:  5 times sit to stand: 14.44 sec with BUE support Timed up and go (TUG): 24.53 sec 2 minute walk test: NT 10 meter walk test: 21.34 sec (1.54  ft/sec) TUG cognitive: (naming colors) 28.62 sec 44M walk back:  42.16 sec with RW Stroop gait:  13.25 sec in 10 ft (naming color words)-0.75 ft/sec   18.75 sec in 10 feet (naming printed colors)-0.53 ft/sec                                                                                                                               TREATMENT DATE: 01/26/2024    PATIENT EDUCATION: Education details: Eval results, POC Person educated: Patient Education method: Explanation Education comprehension: verbalized understanding  HOME EXERCISE PROGRAM: Not yet initiated Did perform eccentric hip flexion/extension in sitting  GOALS: Goals reviewed with patient? Yes  SHORT TERM GOALS: Target date: 02/24/2024  Pt will be independent with HEP for improved strength, balance, gait. Baseline: Goal status: IN PROGRESS  2.  Pt will improve TUG score to less than or equal to 18 sec for decreased fall risk. Baseline: 24.53 sec Goal status: IN PROGRESS  3.  Pt will improve 3 M Walk backwards to less than or equal to 30 seconds for decreased fall risk. Baseline: 42.16 sec Goal status: IN PROGRESS  4.  Pt will report at least 50% improvement in car transfers.  Baseline:  difficulty with RLE  Goal status:  IN PROGRESS  LONG TERM GOALS: Target date: 03/23/2024  Pt will be independent with HEP for improved strength, balance, gait. Baseline:  Goal status: IN PROGRESS  2.  Pt will improve gait velocity to at least 1.8 ft/sec for improved gait efficiency and safety. Baseline: 1.54 ft/sec Goal status: IN PROGRESS  3.  Pt will improve gait velocity with dual task to at least 1 ft/sec for improved gait and decreased fall risk. Baseline: 0.53-0.75 ft/sec with Stroop over 10 ft Goal status: IN PROGRESS  4.  Pt will improve 5x sit<>stand to less than or equal to 11.5 sec with light UE support to demonstrate improved functional strength and transfer efficiency. Baseline: 14.44 sec Goal status: IN  PROGRESS  5.  Pt will verbalize plans for continued community fitness upon d/c from PT to maximize gains made in PT. Baseline:  Goal status: IN PROGRESS  6.  Pt will ambulate at least 200 ft in 2 minute walk, for improved gait efficiency and endurance.  Baseline: 100 ft in 1:30  Goal status:  INITIAL, 01/30/2024  ASSESSMENT:  CLINICAL IMPRESSION: Pt presents today with reports of fatigue due to doing increased walking with rollator while in the mountains.  Skilled PT session focused on lower extremity strengthening to address hip muscles RLE, for improved gait and improved car transfer ability.   Gait x 1:30 at end of session with rollator, 100 ft, with pt noting fatigue.  No other complaints other than fatigue, at end of session.  Pt will continue to benefit from skilled PT towards goals for improved functional mobility and strength.   OBJECTIVE IMPAIRMENTS: Abnormal gait, decreased balance, decreased mobility, difficulty walking, decreased strength, impaired sensation, postural dysfunction, and pain.   ACTIVITY LIMITATIONS: sitting, standing, transfers, locomotion level, and caring for others  PARTICIPATION LIMITATIONS: driving, shopping, community activity, and travel  PERSONAL FACTORS: 3+ comorbidities: see PMH above are also affecting patient's functional outcome.   REHAB POTENTIAL: Good  CLINICAL DECISION MAKING: Evolving/moderate complexity  EVALUATION COMPLEXITY: Moderate  PLAN:  PT FREQUENCY: 2x/week  PT DURATION: 8 weeks plus eval visit  PLANNED INTERVENTIONS: 97750- Physical Performance Testing, 97110-Therapeutic exercises, 97530- Therapeutic activity, 97112- Neuromuscular re-education, 97535- Self Care, [id number]- Manual therapy, [phone]- Gait training, Patient/Family education, and Balance training  PLAN FOR NEXT SESSION: Review initial HEP and progress-lower extremity strengthening, standing and gait tolerance; RLE strength for car transfers   STARLET GREIG ORN.,  PT 01/30/2024, 5:08 PM  [facility] at [facility] [address] [place], [place] [address] Phone # [phone] Fax # [phone]

## 2024-02-01 NOTE — Therapy (Signed)
 OUTPATIENT PHYSICAL THERAPY NEURO TREATMENT NOTE   Patient Name: [name] MRN: [id number] DOB:1955/10/01, 68 y.o., female Today's Date: 02/02/2024   PCP: Rolinda Millman, MD  REFERRING PROVIDER: Urbano Albright, MD   END OF SESSION:  PT End of Session - 02/02/24 1225     Visit Number 3    Number of Visits 17    Date for PT Re-Evaluation 03/23/24    Authorization Type BCBS Medicare    Progress Note Due on Visit 10    PT Start Time 1150    PT Stop Time 1229    PT Time Calculation (min) 39 min    Activity Tolerance Patient tolerated treatment well    Behavior During Therapy WFL for tasks assessed/performed            Past Medical History:  Diagnosis Date   Anemia    Anxiety    Arthritis    Bipolar disorder (HCC)    Cardiomyopathy (HCC)    Complication of anesthesia    Awake but could not move   Depression    Diskitis 10/04/2023   GERD (gastroesophageal reflux disease)    Heart failure (HCC)    Acute   Heart murmur    related to VSD   High cholesterol    History of blood transfusion    related to OR (08/19/2016)   History of hiatal hernia    Hypertension    Hyperthyroidism    Hypotension 09/06/2023   Mild cognitive impairment 09/06/2018   Paroxysmal ventricular tachycardia (HCC)    Pneumonia    Rectovaginal fistula 09/06/2023   Tachycardia, unspecified    Type II diabetes mellitus (HCC)    UTI (urinary tract infection)    being treated with Keflex    Ventricular septal defect    Ventriculitis of brain due to bacteria 11/02/2022   Vertebral fracture, osteoporotic (HCC) 01/06/2023   Past Surgical History:  Procedure Laterality Date   ABDOMINAL HYSTERECTOMY     BACK SURGERY     CARDIAC CATHETERIZATION N/A 06/23/2015   Procedure: Left Heart Cath and Coronary Angiography;  Surgeon: Ezra GORMAN Shuck, MD;  Location: [facility] INVASIVE CV LAB;  Service: Cardiovascular;  Laterality: N/A;   CARDIAC CATHETERIZATION  1960   VSD was so small; didn't need  repaired   COLECTOMY, SIGMOID, ROBOT-ASSISTED N/A 11/30/2023   Procedure: COLECTOMY, SIGMOID, ROBOT-ASSISTED ROBOTIC LOW ANTERIOR RECTOSIGMOID RESECTION (LAR) WITH ANASTOMOSIS COLOVAGINAL FISTULA TAKEDOWN & REPAIR OMENTOPEXY OF VAGINA INTRAOPERATIVE ASSESSMENT OF TISSUE VASCULAR PERFUSION USING ICG (indocyanine green ) IMMUNOFLUORESCENCE TRANSVERSUS ABDOMINIS PLANE (TAP) BLOCK - BILATERAL;  Surgeon: Sheldon Standing, MD;  Location: [facility]S;  Service: General;  Late   CYSTOSCOPY WITH INDOCYANINE GREEN  IMAGING (ICG) N/A 11/30/2023   Procedure: Cystoscopy with bilateral ureteral FireFly injections Transurethral resection of bladder tumor--large;  Surgeon: Carolee Sherwood JONETTA DOUGLAS, MD;  Location: [facility];  Service: Urology;  Laterality: N/A;   EXAM UNDER ANESTHESIA WITH MANIPULATION OF HIP Right 06/02/2014   dr sheril   FLEXIBLE SIGMOIDOSCOPY N/A 11/30/2023   Procedure: KINGSTON SIDE;  Surgeon: Sheldon Standing, MD;  Location: [facility];  Service: General;  Laterality: N/A;   FRACTURE SURGERY     HERNIA REPAIR     HIP CLOSED REDUCTION Right 06/02/2014   Procedure: CLOSED MANIPULATION HIP;  Surgeon: Maude KANDICE sheril, MD;  Location: [facility] OR;  Service: Orthopedics;  Laterality: Right;   IR FLUORO GUIDED NEEDLE PLC ASPIRATION/INJECTION LOC  08/09/2023   JOINT REPLACEMENT     JOINT REPLACEMENT  POSTERIOR LUMBAR FUSION 4 LEVEL N/A 06/26/2022   Procedure: Lumbar One To Lumbar Five Posterior Instrumented Fusion;  Surgeon: Cheryle Debby LABOR, MD;  Location: [facility] OR;  Service: Neurosurgery;  Laterality: N/A;   REFRACTIVE SURGERY Bilateral    SHOULDER ARTHROSCOPY Right    SHOULDER OPEN ROTATOR CUFF REPAIR Right    SPINAL FUSION  1996   t10 down to my coccyx   SPINE HARDWARE REMOVAL     TOTAL ABDOMINAL HYSTERECTOMY     TOTAL HIP ARTHROPLASTY Right 05/10/2014   [place]      by dr lonni olcott   TOTAL KNEE ARTHROPLASTY Left    TOTAL SHOULDER ARTHROPLASTY Left 08/19/2016   Procedure: TOTAL SHOULDER  ARTHROPLASTY;  Surgeon: Eva Herring, MD;  Location: [facility] OR;  Service: Orthopedics;  Laterality: Left;  Left total shoulder replacement   Patient Active Problem List   Diagnosis Date Noted   Colovaginal fistula 11/30/2023   Diskitis 10/04/2023   Hypotension 09/06/2023   Fracture of tenth thoracic vertebra (HCC) 09/05/2023   History of discitis 09/05/2023   Senile osteoporosis 09/05/2023   History of urinary retention 09/05/2023   Vaginal atrophy 09/05/2023   Nocturia 09/05/2023   Vulvar ulcer 09/05/2023   Pulmonary nodule 08/02/2023   Non-traumatic compression fracture of vertebral column (HCC) 06/24/2023   Nonobstructive atherosclerosis of coronary artery 02/10/2023   Bilateral pseudophakia 02/03/2023   Vertebral fracture, osteoporotic (HCC) 01/06/2023   Ventriculitis of brain due to bacteria 11/02/2022   Anemia of chronic illness 10/12/2022   Bipolar disorder, in partial remission, most recent episode depressed (HCC) 10/07/2022   Adjustment disorder with mixed anxiety and depressed mood 09/30/2022   Chronic wound 09/25/2022   Hyperglycemia 09/25/2022   Chronic midline thoracic back pain 09/17/2022   Primary insomnia 09/17/2022   Chronic combined systolic and diastolic CHF (congestive heart failure) (HCC) 09/14/2022   Acute blood loss anemia 09/09/2022   Infection of lumbar spine (HCC) 09/03/2022   Malnutrition of moderate degree 08/20/2022   Hardware complicating wound infection (HCC) 08/20/2022   Infection of deep incisional surgical site after procedure 08/20/2022   Aspiration pneumonia of both lungs (HCC) 08/19/2022   Acute respiratory failure with hypoxia (HCC) 08/19/2022   Sepsis (HCC) 08/19/2022   Seizure (HCC) 08/19/2022   Wound dehiscence 08/09/2022   Delayed surgical wound healing 07/08/2022   Fracture of lumbar spine without cord injury (HCC) 06/26/2022   Lumbar vertebral fracture (HCC) 06/25/2022   Mood disorder (HCC) 09/29/2021   Generalized muscle weakness  04/30/2019   Mild cognitive impairment 09/06/2018   Hyponatremia 02/28/2018   S/P shoulder replacement, left 08/19/2016   Anxiety 10/17/2014   Acid reflux 10/17/2014   BP (high blood pressure) 10/17/2014   Arthritis, degenerative 10/17/2014   Adult hypothyroidism 10/17/2014   UTI (urinary tract infection) 06/03/2014   Fracture of bone adjacent to prosthesis 06/03/2014   Peri-prosthetic fracture of femur following total hip arthroplasty 06/02/2014   Urinary retention 05/13/2014   Chronic pain 05/10/2014   History of hip surgery 05/10/2014   Osteoarthritis 09/30/2011   UNSPECIFIED HEART FAILURE 06/24/2010   UNSPECIFIED CONGENITAL DEFECT OF SEPTAL CLOSURE 06/24/2010   Secondary cardiomyopathy (HCC) 03/18/2010   Tobacco use 03/18/2010   DM 06/13/2009   HYPERTENSION, UNSPECIFIED 06/13/2009   VENTRICULAR TACHYCARDIA 06/13/2009   VENTRICULAR SEPTAL DEFECT, CONGENITAL 06/13/2009   Essential (primary) hypertension 06/13/2009   Diabetes mellitus type 2, noninsulin dependent (HCC) 06/13/2009    ONSET DATE: 01/05/2024 MD referral  REFERRING DIAG: R26.9 (ICD-10-CM) - Gait difficulty  THERAPY DIAG:  Muscle weakness (generalized)  Unsteadiness on feet  Other abnormalities of gait and mobility  Rationale for Evaluation and Treatment: Rehabilitation  SUBJECTIVE:                                                                                                                                                                                             SUBJECTIVE STATEMENT: Have been having issues with the R hip. Requesting not to perform the nustep- that thing kills my hip. Pt reports that she would like to work towards being able to walk down her ramp and into her car (reports currently limited by endurance and balance)  Pt accompanied by: self  PERTINENT HISTORY: Chronic osteomyelitis with worsening back pain; Hx of multiple R hip fractures with surgery to revisions, significant leg  length difference with LLE longer than RLE, hx of infection post surgery in spine (treated on antibiotics) Per MD Note 01/09/2024:    Patient was seen by Dr. Elspeth Schultze who scheduled her for surgery and on  12/01/2023 she underwent   -ROBOTIC LOW ANTERIOR RECTOSIGMOID RESECTION (LAR) WITH ANASTOMOSIS -COLOVAGINAL FISTULA TAKEDOWN & REPAIR -OMENTOPEXY OF VAGINA -INTRAOPERATIVE ASSESSMENT OF TISSUE VASCULAR PERFUSION USING ICG (indocyanine green ) IMMUNOFLUORESCENCE -TRANSVERSUS ABDOMINIS PLANE (TAP) BLOCK - BILATERAL -FLEXIBLE SIGMOIDOSCOPY    Urology did joint surgery and did    1.  Cystoscopy with bilateral ureteral FireFly injections 2.  Transurethral resection of  possible bladder tumor--large   Pathology came back with:   A. BLADDER TUMOR, TRANSURETHERAL RESECTON:  Florid cystitis cystica and papillary cystitis  Negative for carcinoma   B. RECTOSIGMOID COLON RESECTION:  Diverticula with chronic inflammation, perforation, fistula and reactive  fibrosis  Negative for carcinoma  Five benign lymph nodes (0/5)   C. FINAL DISTAL MARGIN:  Benign colonic tissue          PAIN:  Are you having pain? Yes: NPRS scale: it's not as bad/10 Pain location: R hip Pain description: throbbing, grinding, sharp Aggravating factors: more activities, [place] Relieving factors: rest  PRECAUTIONS: Fall and Other: Recent colectomy-pt reports no restrictions Pt has history of osteoporosis and compression fractures RED FLAGS: None   WEIGHT BEARING RESTRICTIONS: No  FALLS: Has patient fallen in last 6 months? No  LIVING ENVIRONMENT: Lives with: lives with their family Lives in: House/apartment Stairs: long ramp in/out of home Has following equipment at home: Vannie - 2 wheeled and Wheelchair (manual)  PLOF: Needs assistance with gait and Vocation/Vocational requirements: retired Runner, broadcasting/film/video, Tree surgeon; enjoys spending time with grandchildren in [place] and goes to [employer] weekly  PATIENT GOALS: To  get stronger, to get back on a cane.  To use walker to get to the car (long ramp), get RLE into car better  OBJECTIVE:      TODAY'S TREATMENT: 02/02/24 Activity Comments  review of HEP: sitting ball squeeze 10x5 sitting clam red TB 10x STS with TB above knees  Review for proper form. RW in front for safety with STS- able to progress to no UE support  standing mini squat At RW- limited ability d/t length length discrepancy   STS with R foot back ~7x Pushing off mat; c/o R anterior hip pain thus discontinued   Sitting red TB row 2x10 Cues to sit up tall, scap squeeze   sitting shoulder extension red TB 2x10  Cues to sit up tall, scap squeeze   Gait and stand/pivot transfer w/c <>chair and in between activities PT provides set up for transfers and CGA for gait      Access Code: [email or web address] URL: [email or web address] Date: 02/02/2024 Prepared by: [facility] - Outpatient  Rehab - [facility]  Program Notes Seated leg slides out and in;  have a pillow case under your right foot-slide your foot out and in, 3 x 10 reps.  You can have a 3-5# ankle weight on your ankle  Exercises - Seated Hip Adduction Isometrics with Ball  - 1 x daily - 7 x weekly - 3 sets - 10 reps - Seated Hip Abduction with Resistance  - 1 x daily - 7 x weekly - 3 sets - 10 reps - Sit to Stand with Resistance Around Legs  - 1 x daily - 7 x weekly - 3 sets - 5 reps - Seated Shoulder Row with Anchored Resistance  - 1 x daily - 5 x weekly - 2 sets - 10 reps     PATIENT EDUCATION: Education details: HEP update Person educated: Patient Education method: Explanation, Demonstration, Tactile cues, Verbal cues, and Handouts Education comprehension: verbalized understanding and returned demonstration    --------------------------------------------------- Note: Objective measures were completed at Evaluation unless otherwise noted.  DIAGNOSTIC FINDINGS: MRI 09/2023    IMPRESSION: 1. Postoperative changes of T11-S1 laminectomy and posterior spinal fusion with right posterior spinal instrumentation from L2-L5. On prior CT, there is evidence of fracture through the left posterior fusion mass at L4-5, correlating with edema is seen in the left pedicle of L4 on today's study. 2. Decreased edema within the L4 and L5 vertebral bodies and posterior elements, as well as within the L4-5 and L3-4 discs, likely reactive and related to the above fracture. 3. Loculated simple fluid within the thecal sac with some marginalized cauda equina nerve roots, most consistent with arachnoiditis. 4. No spinal canal stenosis or neural foraminal narrowing.  COGNITION: Overall cognitive status: History of cognitive impairments - at baseline   SENSATION: Light touch: Impaired  and to light touch-see below Reports some numbness L foot  POSTURE: Leg length diffierence  LOWER EXTREMITY ROM:     Active  Right Eval Left Eval  Hip flexion    Hip extension    Hip abduction    Hip adduction    Hip internal rotation    Hip external rotation    Knee flexion    Knee extension    Ankle dorsiflexion    Ankle plantarflexion    Ankle inversion    Ankle eversion     (Blank rows = not tested)  LOWER EXTREMITY MMT:    MMT Right Eval Left Eval  Hip flexion 3- 3+  Hip  extension    Hip abduction 4 4  Hip adduction 4 4  Hip internal rotation    Hip external rotation    Knee flexion 4 4  Knee extension 3+ 4  Ankle dorsiflexion 3+ 3+  Ankle plantarflexion    Ankle inversion    Ankle eversion    (Blank rows = not tested)   TRANSFERS: Sit to stand: SBA  Assistive device utilized: Environmental consultant - 2 wheeled     Stand to sit: SBA  Assistive device utilized: Environmental consultant - 2 wheeled      GAIT: Findings: Gait Characteristics: excess lateral weightshift through trunk due to significant leg length difference and step through pattern, Distance walked: 50 ft, Assistive device  utilized:Walker - 2 wheeled, Level of assistance: CGA, and Comments: NA  FUNCTIONAL TESTS:  5 times sit to stand: 14.44 sec with BUE support Timed up and go (TUG): 24.53 sec 2 minute walk test: NT 10 meter walk test: 21.34 sec (1.54 ft/sec) TUG cognitive: (naming colors) 28.62 sec 59M walk back:  42.16 sec with RW Stroop gait:  13.25 sec in 10 ft (naming color words)-0.75 ft/sec   18.75 sec in 10 feet (naming printed colors)-0.53 ft/sec                                                                                                                               TREATMENT DATE: 01/26/2024    PATIENT EDUCATION: Education details: Eval results, POC Person educated: Patient Education method: Explanation Education comprehension: verbalized understanding  HOME EXERCISE PROGRAM: Not yet initiated Did perform eccentric hip flexion/extension in sitting  GOALS: Goals reviewed with patient? Yes  SHORT TERM GOALS: Target date: 02/24/2024  Pt will be independent with HEP for improved strength, balance, gait. Baseline: Goal status: IN PROGRESS  2.  Pt will improve TUG score to less than or equal to 18 sec for decreased fall risk. Baseline: 24.53 sec Goal status: IN PROGRESS  3.  Pt will improve 3 M Walk backwards to less than or equal to 30 seconds for decreased fall risk. Baseline: 42.16 sec Goal status: IN PROGRESS  4.  Pt will report at least 50% improvement in car transfers.  Baseline:  difficulty with RLE  Goal status:  IN PROGRESS  LONG TERM GOALS: Target date: 03/23/2024  Pt will be independent with HEP for improved strength, balance, gait. Baseline:  Goal status: IN PROGRESS  2.  Pt will improve gait velocity to at least 1.8 ft/sec for improved gait efficiency and safety. Baseline: 1.54 ft/sec Goal status: IN PROGRESS  3.  Pt will improve gait velocity with dual task to at least 1 ft/sec for improved gait and decreased fall risk. Baseline: 0.53-0.75 ft/sec with Stroop  over 10 ft Goal status: IN PROGRESS  4.  Pt will improve 5x sit<>stand to less than or equal to 11.5 sec with light UE support to demonstrate improved functional strength and transfer efficiency. Baseline: 14.44 sec Goal status:  IN PROGRESS  5.  Pt will verbalize plans for continued community fitness upon d/c from PT to maximize gains made in PT. Baseline:  Goal status: IN PROGRESS  6.  Pt will ambulate at least 200 ft in 2 minute walk, for improved gait efficiency and endurance.  Baseline: 100 ft in 1:30  Goal status:  INITIAL, 01/30/2024   ASSESSMENT:  CLINICAL IMPRESSION: Patient arrived to session with request not to perform Nustep d/t this aggravating her chronic R hip pain. Reviewed HEP to assess for proper form and tolerance, providing cues as needed. Progressed LE strengthening in standing- patient performed well but did note increased anterior R hip pain from staggered STS, thus proceeded with seated core strengthening instead. No complaints reported at end of session.   OBJECTIVE IMPAIRMENTS: Abnormal gait, decreased balance, decreased mobility, difficulty walking, decreased strength, impaired sensation, postural dysfunction, and pain.   ACTIVITY LIMITATIONS: sitting, standing, transfers, locomotion level, and caring for others  PARTICIPATION LIMITATIONS: driving, shopping, community activity, and travel  PERSONAL FACTORS: 3+ comorbidities: see PMH above are also affecting patient's functional outcome.   REHAB POTENTIAL: Good  CLINICAL DECISION MAKING: Evolving/moderate complexity  EVALUATION COMPLEXITY: Moderate  PLAN:  PT FREQUENCY: 2x/week  PT DURATION: 8 weeks plus eval visit  PLANNED INTERVENTIONS: 97750- Physical Performance Testing, 97110-Therapeutic exercises, 97530- Therapeutic activity, 97112- Neuromuscular re-education, 97535- Self Care, [id number]- Manual therapy, [phone]- Gait training, Patient/Family education, and Balance training  PLAN FOR NEXT SESSION:  ower extremity strengthening, standing and gait tolerance; RLE strength for car transfers; pt requests not to perform Nustep d/t hip pain; pt reports that she would like to work towards being able to walk down her ramp and into her car (reports currently limited by endurance and balance)   Louana Terrilyn Christians, PT, DPT 02/02/24 12:32 PM  [facility] [address], [address] [place], [place] [address] Phone # [phone] Fax # [phone]

## 2024-02-02 ENCOUNTER — Ambulatory Visit: Admitting: Physical Therapy

## 2024-02-02 ENCOUNTER — Encounter: Payer: Self-pay | Admitting: Physical Therapy

## 2024-02-02 DIAGNOSIS — R2689 Other abnormalities of gait and mobility: Secondary | ICD-10-CM

## 2024-02-02 DIAGNOSIS — M6281 Muscle weakness (generalized): Secondary | ICD-10-CM

## 2024-02-02 DIAGNOSIS — R2681 Unsteadiness on feet: Secondary | ICD-10-CM

## 2024-02-07 ENCOUNTER — Ambulatory Visit

## 2024-02-07 DIAGNOSIS — M6281 Muscle weakness (generalized): Secondary | ICD-10-CM | POA: Diagnosis not present

## 2024-02-07 DIAGNOSIS — R2689 Other abnormalities of gait and mobility: Secondary | ICD-10-CM

## 2024-02-07 DIAGNOSIS — R2681 Unsteadiness on feet: Secondary | ICD-10-CM

## 2024-02-07 NOTE — Therapy (Signed)
 OUTPATIENT PHYSICAL THERAPY NEURO TREATMENT NOTE   Patient Name: [name] MRN: [id number] DOB:[birth year], 68 y.o., female Today's Date: 02/07/2024   PCP: Rolinda Millman, MD  REFERRING PROVIDER: Urbano Albright, MD   END OF SESSION:  PT End of Session - 02/07/24 1623     Visit Number 4    Number of Visits 17    Date for PT Re-Evaluation 03/23/24    Authorization Type [employer] Medicare    Progress Note Due on Visit 10    PT Start Time 1615    PT Stop Time 1700    PT Time Calculation (min) 45 min    Activity Tolerance Patient tolerated treatment well    Behavior During Therapy WFL for tasks assessed/performed            Past Medical History:  Diagnosis Date   Anemia    Anxiety    Arthritis    Bipolar disorder (HCC)    Cardiomyopathy (HCC)    Complication of anesthesia    Awake but could not move   Depression    Diskitis 10/04/2023   GERD (gastroesophageal reflux disease)    Heart failure (HCC)    Acute   Heart murmur    related to VSD   High cholesterol    History of blood transfusion    related to OR (08/19/2016)   History of hiatal hernia    Hypertension    Hyperthyroidism    Hypotension 09/06/2023   Mild cognitive impairment 09/06/2018   Paroxysmal ventricular tachycardia (HCC)    Pneumonia    Rectovaginal fistula 09/06/2023   Tachycardia, unspecified    Type II diabetes mellitus (HCC)    UTI (urinary tract infection)    being treated with Keflex    Ventricular septal defect    Ventriculitis of brain due to bacteria 11/02/2022   Vertebral fracture, osteoporotic (HCC) 01/06/2023   Past Surgical History:  Procedure Laterality Date   ABDOMINAL HYSTERECTOMY     BACK SURGERY     CARDIAC CATHETERIZATION N/A 06/23/2015   Procedure: Left Heart Cath and Coronary Angiography;  Surgeon: Ezra GORMAN Shuck, MD;  Location: [facility];  Service: Cardiovascular;  Laterality: N/A;   CARDIAC CATHETERIZATION  1960   VSD was so small; didn't need  repaired   COLECTOMY, SIGMOID, ROBOT-ASSISTED N/A 11/30/2023   Procedure: COLECTOMY, SIGMOID, ROBOT-ASSISTED ROBOTIC LOW ANTERIOR RECTOSIGMOID RESECTION (LAR) WITH ANASTOMOSIS COLOVAGINAL FISTULA TAKEDOWN & REPAIR OMENTOPEXY OF VAGINA INTRAOPERATIVE ASSESSMENT OF TISSUE VASCULAR PERFUSION USING ICG (indocyanine green ) IMMUNOFLUORESCENCE TRANSVERSUS ABDOMINIS PLANE (TAP) BLOCK - BILATERAL;  Surgeon: Sheldon Standing, MD;  Location: [facility];  Service: General;  Late   CYSTOSCOPY WITH INDOCYANINE GREEN  IMAGING (ICG) N/A 11/30/2023   Procedure: Cystoscopy with bilateral ureteral FireFly injections Transurethral resection of bladder tumor--large;  Surgeon: Carolee Sherwood JONETTA DOUGLAS, MD;  Location: [facility];  Service: Urology;  Laterality: N/A;   EXAM UNDER ANESTHESIA WITH MANIPULATION OF HIP Right 06/02/2014   dr sheril   FLEXIBLE SIGMOIDOSCOPY N/A 11/30/2023   Procedure: KINGSTON SIDE;  Surgeon: Sheldon Standing, MD;  Location: [facility];  Service: General;  Laterality: N/A;   FRACTURE SURGERY     HERNIA REPAIR     HIP CLOSED REDUCTION Right 06/02/2014   Procedure: CLOSED MANIPULATION HIP;  Surgeon: Maude KANDICE sheril, MD;  Location: [facility];  Service: Orthopedics;  Laterality: Right;   IR FLUORO GUIDED NEEDLE PLC ASPIRATION/INJECTION LOC  08/09/2023   JOINT REPLACEMENT     JOINT REPLACEMENT  POSTERIOR LUMBAR FUSION 4 LEVEL N/A 06/26/2022   Procedure: Lumbar One To Lumbar Five Posterior Instrumented Fusion;  Surgeon: Cheryle Debby LABOR, MD;  Location: [facility];  Service: Neurosurgery;  Laterality: N/A;   REFRACTIVE SURGERY Bilateral    SHOULDER ARTHROSCOPY Right    SHOULDER OPEN ROTATOR CUFF REPAIR Right    SPINAL FUSION  1996   t10 down to my coccyx   SPINE HARDWARE REMOVAL     TOTAL ABDOMINAL HYSTERECTOMY     TOTAL HIP ARTHROPLASTY Right 05/10/2014   [place]      by dr lonni olcott   TOTAL KNEE ARTHROPLASTY Left    TOTAL SHOULDER ARTHROPLASTY Left 08/19/2016   Procedure: TOTAL SHOULDER  ARTHROPLASTY;  Surgeon: Eva Herring, MD;  Location: [facility] OR;  Service: Orthopedics;  Laterality: Left;  Left total shoulder replacement   Patient Active Problem List   Diagnosis Date Noted   Colovaginal fistula 11/30/2023   Diskitis 10/04/2023   Hypotension 09/06/2023   Fracture of tenth thoracic vertebra (HCC) 09/05/2023   History of discitis 09/05/2023   Senile osteoporosis 09/05/2023   History of urinary retention 09/05/2023   Vaginal atrophy 09/05/2023   Nocturia 09/05/2023   Vulvar ulcer 09/05/2023   Pulmonary nodule 08/02/2023   Non-traumatic compression fracture of vertebral column (HCC) 06/24/2023   Nonobstructive atherosclerosis of coronary artery 02/10/2023   Bilateral pseudophakia 02/03/2023   Vertebral fracture, osteoporotic (HCC) 01/06/2023   Ventriculitis of brain due to bacteria 11/02/2022   Anemia of chronic illness 10/12/2022   Bipolar disorder, in partial remission, most recent episode depressed (HCC) 10/07/2022   Adjustment disorder with mixed anxiety and depressed mood 09/30/2022   Chronic wound 09/25/2022   Hyperglycemia 09/25/2022   Chronic midline thoracic back pain 09/17/2022   Primary insomnia 09/17/2022   Chronic combined systolic and diastolic CHF (congestive heart failure) (HCC) 09/14/2022   Acute blood loss anemia 09/09/2022   Infection of lumbar spine (HCC) 09/03/2022   Malnutrition of moderate degree 08/20/2022   Hardware complicating wound infection (HCC) 08/20/2022   Infection of deep incisional surgical site after procedure 08/20/2022   Aspiration pneumonia of both lungs (HCC) 08/19/2022   Acute respiratory failure with hypoxia (HCC) 08/19/2022   Sepsis (HCC) 08/19/2022   Seizure (HCC) 08/19/2022   Wound dehiscence 08/09/2022   Delayed surgical wound healing 07/08/2022   Fracture of lumbar spine without cord injury (HCC) 06/26/2022   Lumbar vertebral fracture (HCC) 06/25/2022   Mood disorder (HCC) 09/29/2021   Generalized muscle weakness  04/30/2019   Mild cognitive impairment 09/06/2018   Hyponatremia 02/28/2018   S/P shoulder replacement, left 08/19/2016   Anxiety 10/17/2014   Acid reflux 10/17/2014   BP (high blood pressure) 10/17/2014   Arthritis, degenerative 10/17/2014   Adult hypothyroidism 10/17/2014   UTI (urinary tract infection) 06/03/2014   Fracture of bone adjacent to prosthesis 06/03/2014   Peri-prosthetic fracture of femur following total hip arthroplasty 06/02/2014   Urinary retention 05/13/2014   Chronic pain 05/10/2014   History of hip surgery 05/10/2014   Osteoarthritis 09/30/2011   UNSPECIFIED HEART FAILURE 06/24/2010   UNSPECIFIED CONGENITAL DEFECT OF SEPTAL CLOSURE 06/24/2010   Secondary cardiomyopathy (HCC) 03/18/2010   Tobacco use 03/18/2010   DM 06/13/2009   HYPERTENSION, UNSPECIFIED 06/13/2009   VENTRICULAR TACHYCARDIA 06/13/2009   VENTRICULAR SEPTAL DEFECT, CONGENITAL 06/13/2009   Essential (primary) hypertension 06/13/2009   Diabetes mellitus type 2, noninsulin dependent (HCC) 06/13/2009    ONSET DATE: 01/05/2024 MD referral  REFERRING DIAG: R26.9 (ICD-10-CM) - Gait difficulty  THERAPY DIAG:  Muscle weakness (generalized)  Unsteadiness on feet  Other abnormalities of gait and mobility  Rationale for Evaluation and Treatment: Rehabilitation  SUBJECTIVE:                                                                                                                                                                                             SUBJECTIVE STATEMENT: Have been having issues with the R hip. Requesting not to perform the nustep- that thing kills my hip. Pt reports that she would like to work towards being able to walk down her ramp and into her car (reports currently limited by endurance and balance)  Pt accompanied by: self  PERTINENT HISTORY: Chronic osteomyelitis with worsening back pain; Hx of multiple R hip fractures with surgery to revisions, significant leg  length difference with LLE longer than RLE, hx of infection post surgery in spine (treated on antibiotics) Per MD Note 01/09/2024:    Patient was seen by Dr. Elspeth Schultze who scheduled her for surgery and on  12/01/2023 she underwent   -ROBOTIC LOW ANTERIOR RECTOSIGMOID RESECTION (LAR) WITH ANASTOMOSIS -COLOVAGINAL FISTULA TAKEDOWN & REPAIR -OMENTOPEXY OF VAGINA -INTRAOPERATIVE ASSESSMENT OF TISSUE VASCULAR PERFUSION USING ICG (indocyanine green ) IMMUNOFLUORESCENCE -TRANSVERSUS ABDOMINIS PLANE (TAP) BLOCK - BILATERAL -FLEXIBLE SIGMOIDOSCOPY    Urology did joint surgery and did    1.  Cystoscopy with bilateral ureteral FireFly injections 2.  Transurethral resection of  possible bladder tumor--large   Pathology came back with:   A. BLADDER TUMOR, TRANSURETHERAL RESECTON:  Florid cystitis cystica and papillary cystitis  Negative for carcinoma   B. RECTOSIGMOID COLON RESECTION:  Diverticula with chronic inflammation, perforation, fistula and reactive  fibrosis  Negative for carcinoma  Five benign lymph nodes (0/5)   C. FINAL DISTAL MARGIN:  Benign colonic tissue          PAIN:  Are you having pain? Yes: NPRS scale: it's not as bad/10 Pain location: R hip Pain description: throbbing, grinding, sharp Aggravating factors: more activities, [place] Relieving factors: rest  PRECAUTIONS: Fall and Other: Recent colectomy-pt reports no restrictions Pt has history of osteoporosis and compression fractures RED FLAGS: None   WEIGHT BEARING RESTRICTIONS: No  FALLS: Has patient fallen in last 6 months? No  LIVING ENVIRONMENT: Lives with: lives with their family Lives in: House/apartment Stairs: long ramp in/out of home Has following equipment at home: Vannie - 2 wheeled and Wheelchair (manual)  PLOF: Needs assistance with gait and Vocation/Vocational requirements: retired Runner, broadcasting/film/video, Tree surgeon; enjoys spending time with grandchildren in [place] and goes to [employer] weekly  PATIENT GOALS: To  get stronger, to get back on a cane.  To use walker to get to the car (long ramp), get RLE into car better  OBJECTIVE:   TODAY'S TREATMENT: 02/07/24 Activity Comments  standing balance -trials w/ 4 block under RLE for leg length discrepancy. Weight shifting 3x10 reps all directions -forward/backward walking w/ RLE elevated on 4 step --reports decreased strain on hip with accommodated position  Gait training -level surfaces w/ RW and SBA x 50 ft increments -curb training w/ RW CGA  LE PRE Supported hooklying--performing SAQ 3#, multi-angle isometrics for hip add, abd, flexion, hamstring 4x12 reps                TODAY'S TREATMENT: 02/02/24 Activity Comments  review of HEP: sitting ball squeeze 10x5 sitting clam red TB 10x STS with TB above knees  Review for proper form. RW in front for safety with STS- able to progress to no UE support  standing mini squat At RW- limited ability d/t length length discrepancy   STS with R foot back ~7x Pushing off mat; c/o R anterior hip pain thus discontinued   Sitting red TB row 2x10 Cues to sit up tall, scap squeeze   sitting shoulder extension red TB 2x10  Cues to sit up tall, scap squeeze   Gait and stand/pivot transfer w/c <>chair and in between activities PT provides set up for transfers and CGA for gait      Access Code: [email or web address] URL: [email or web address] Date: 02/02/2024 Prepared by: [facility] - Outpatient  Rehab - [facility]  Program Notes Seated leg slides out and in;  have a pillow case under your right foot-slide your foot out and in, 3 x 10 reps.  You can have a 3-5# ankle weight on your ankle  Exercises - Seated Hip Adduction Isometrics with Ball  - 1 x daily - 7 x weekly - 3 sets - 10 reps - Seated Hip Abduction with Resistance  - 1 x daily - 7 x weekly - 3 sets - 10 reps - Sit to Stand with Resistance Around Legs  - 1 x daily - 7 x weekly - 3 sets - 5 reps - Seated  Shoulder Row with Anchored Resistance  - 1 x daily - 5 x weekly - 2 sets - 10 reps     PATIENT EDUCATION: Education details: HEP update Person educated: Patient Education method: Explanation, Demonstration, Tactile cues, Verbal cues, and Handouts Education comprehension: verbalized understanding and returned demonstration    --------------------------------------------------- Note: Objective measures were completed at Evaluation unless otherwise noted.  DIAGNOSTIC FINDINGS: MRI 09/2023   IMPRESSION: 1. Postoperative changes of T11-S1 laminectomy and posterior spinal fusion with right posterior spinal instrumentation from L2-L5. On prior CT, there is evidence of fracture through the left posterior fusion mass at L4-5, correlating with edema is seen in the left pedicle of L4 on today's study. 2. Decreased edema within the L4 and L5 vertebral bodies and posterior elements, as well as within the L4-5 and L3-4 discs, likely reactive and related to the above fracture. 3. Loculated simple fluid within the thecal sac with some marginalized cauda equina nerve roots, most consistent with arachnoiditis. 4. No spinal canal stenosis or neural foraminal narrowing.  COGNITION: Overall cognitive status: History of cognitive impairments - at baseline   SENSATION: Light touch: Impaired  and to light touch-see below Reports some numbness L foot  POSTURE: Leg length diffierence  LOWER EXTREMITY ROM:     Active  Right Eval Left Eval  Hip flexion    Hip extension    Hip abduction    Hip adduction    Hip internal rotation    Hip external rotation    Knee flexion    Knee extension    Ankle dorsiflexion    Ankle plantarflexion    Ankle inversion    Ankle eversion     (Blank rows = not tested)  LOWER EXTREMITY MMT:    MMT Right Eval Left Eval  Hip flexion 3- 3+  Hip extension    Hip abduction 4 4  Hip adduction 4 4  Hip internal rotation    Hip external rotation    Knee  flexion 4 4  Knee extension 3+ 4  Ankle dorsiflexion 3+ 3+  Ankle plantarflexion    Ankle inversion    Ankle eversion    (Blank rows = not tested)   TRANSFERS: Sit to stand: SBA  Assistive device utilized: Environmental consultant - 2 wheeled     Stand to sit: SBA  Assistive device utilized: Environmental consultant - 2 wheeled      GAIT: Findings: Gait Characteristics: excess lateral weightshift through trunk due to significant leg length difference and step through pattern, Distance walked: 50 ft, Assistive device utilized:Walker - 2 wheeled, Level of assistance: CGA, and Comments: NA  FUNCTIONAL TESTS:  5 times sit to stand: 14.44 sec with BUE support Timed up and go (TUG): 24.53 sec 2 minute walk test: NT 10 meter walk test: 21.34 sec (1.54 ft/sec) TUG cognitive: (naming colors) 28.62 sec 74M walk back:  42.16 sec with RW Stroop gait:  13.25 sec in 10 ft (naming color words)-0.75 ft/sec   18.75 sec in 10 feet (naming printed colors)-0.53 ft/sec                                                                                                                               TREATMENT DATE: 01/26/2024    PATIENT EDUCATION: Education details: Eval results, POC Person educated: Patient Education method: Explanation Education comprehension: verbalized understanding  HOME EXERCISE PROGRAM: Not yet initiated Did perform eccentric hip flexion/extension in sitting  GOALS: Goals reviewed with patient? Yes  SHORT TERM GOALS: Target date: 02/24/2024  Pt will be independent with HEP for improved strength, balance, gait. Baseline: Goal status: IN PROGRESS  2.  Pt will improve TUG score to less than or equal to 18 sec for decreased fall risk. Baseline: 24.53 sec Goal status: IN PROGRESS  3.  Pt will improve 3 M Walk backwards to less than or equal to 30 seconds for decreased fall risk. Baseline: 42.16 sec Goal status: IN PROGRESS  4.  Pt will report at least 50% improvement in car transfers.  Baseline:  difficulty  with RLE  Goal status:  IN PROGRESS  LONG TERM GOALS: Target date: 03/23/2024  Pt will be independent with HEP for improved strength, balance, gait. Baseline:  Goal status: IN PROGRESS  2.  Pt will improve gait  velocity to at least 1.8 ft/sec for improved gait efficiency and safety. Baseline: 1.54 ft/sec Goal status: IN PROGRESS  3.  Pt will improve gait velocity with dual task to at least 1 ft/sec for improved gait and decreased fall risk. Baseline: 0.53-0.75 ft/sec with Stroop over 10 ft Goal status: IN PROGRESS  4.  Pt will improve 5x sit<>stand to less than or equal to 11.5 sec with light UE support to demonstrate improved functional strength and transfer efficiency. Baseline: 14.44 sec Goal status: IN PROGRESS  5.  Pt will verbalize plans for continued community fitness upon d/c from PT to maximize gains made in PT. Baseline:  Goal status: IN PROGRESS  6.  Pt will ambulate at least 200 ft in 2 minute walk, for improved gait efficiency and endurance.  Baseline: 100 ft in 1:30  Goal status:  INITIAL, 01/30/2024   ASSESSMENT:  CLINICAL IMPRESSION: Reports increased strain to right hip w/ Wbing and walking, this was somewhat alleviated by elevating RLE 4 to accommodate leg length discrepancy and able to performing weight shift all directions without issue. Gait training w/ RW to improve endurance and safety w/ negotiating obstacles and curbs SBA-CGA. LE PRE requiring manual assist for all except hip add and SAQ due to LE issues with good tolerance to multi-angle isometrics in order to improve strength/force/prevent atrophy. Continued sessoins to progress POC details.   OBJECTIVE IMPAIRMENTS: Abnormal gait, decreased balance, decreased mobility, difficulty walking, decreased strength, impaired sensation, postural dysfunction, and pain.   ACTIVITY LIMITATIONS: sitting, standing, transfers, locomotion level, and caring for others  PARTICIPATION LIMITATIONS: driving, shopping,  community activity, and travel  PERSONAL FACTORS: 3+ comorbidities: see PMH above are also affecting patient's functional outcome.   REHAB POTENTIAL: Good  CLINICAL DECISION MAKING: Evolving/moderate complexity  EVALUATION COMPLEXITY: Moderate  PLAN:  PT FREQUENCY: 2x/week  PT DURATION: 8 weeks plus eval visit  PLANNED INTERVENTIONS: 97750- Physical Performance Testing, 97110-Therapeutic exercises, 97530- Therapeutic activity, 97112- Neuromuscular re-education, 97535- Self Care, [id number]- Manual therapy, [phone]- Gait training, Patient/Family education, and Balance training  PLAN FOR NEXT SESSION: ower extremity strengthening, standing and gait tolerance; RLE strength for car transfers; pt requests not to perform Nustep d/t hip pain; pt reports that she would like to work towards being able to walk down her ramp and into her car (reports currently limited by endurance and balance)   5:15 PM, 02/07/24 M. Kelly Mariely Mahr, PT, DPT Physical Therapist- [place] Office Number: [phone]

## 2024-02-08 ENCOUNTER — Ambulatory Visit
Admission: RE | Admit: 2024-02-08 | Discharge: 2024-02-08 | Disposition: A | Discharge status: Home / Self Care | Source: Ambulatory Visit | Attending: Emergency Medicine | Admitting: Emergency Medicine

## 2024-02-08 ENCOUNTER — Ambulatory Visit: Payer: Self-pay | Admitting: Emergency Medicine

## 2024-02-08 ENCOUNTER — Other Ambulatory Visit: Payer: Medicare Other

## 2024-02-08 DIAGNOSIS — R911 Solitary pulmonary nodule: Secondary | ICD-10-CM

## 2024-02-09 ENCOUNTER — Ambulatory Visit: Admitting: Physical Therapy

## 2024-02-09 ENCOUNTER — Encounter: Payer: Self-pay | Admitting: Physical Therapy

## 2024-02-09 DIAGNOSIS — M6281 Muscle weakness (generalized): Secondary | ICD-10-CM

## 2024-02-09 DIAGNOSIS — R2689 Other abnormalities of gait and mobility: Secondary | ICD-10-CM

## 2024-02-09 DIAGNOSIS — R2681 Unsteadiness on feet: Secondary | ICD-10-CM

## 2024-02-09 NOTE — Therapy (Signed)
 OUTPATIENT PHYSICAL THERAPY NEURO TREATMENT NOTE   Patient Name: [name] MRN: [id number] DOB:[birth year], 68 y.o., female Today's Date: 02/09/2024   PCP: Rolinda Millman, MD  REFERRING PROVIDER: Urbano Albright, MD   END OF SESSION:  PT End of Session - 02/09/24 1146     Visit Number 5    Number of Visits 17    Date for PT Re-Evaluation 03/23/24    Authorization Type BCBS Medicare    Progress Note Due on Visit 10    PT Start Time 1147    PT Stop Time 1231    PT Time Calculation (min) 44 min    Activity Tolerance Patient tolerated treatment well;Patient limited by pain    Behavior During Therapy WFL for tasks assessed/performed             Past Medical History:  Diagnosis Date   Anemia    Anxiety    Arthritis    Bipolar disorder (HCC)    Cardiomyopathy (HCC)    Complication of anesthesia    Awake but could not move   Depression    Diskitis 10/04/2023   GERD (gastroesophageal reflux disease)    Heart failure (HCC)    Acute   Heart murmur    related to VSD   High cholesterol    History of blood transfusion    related to OR (08/19/2016)   History of hiatal hernia    Hypertension    Hyperthyroidism    Hypotension 09/06/2023   Mild cognitive impairment 09/06/2018   Paroxysmal ventricular tachycardia (HCC)    Pneumonia    Rectovaginal fistula 09/06/2023   Tachycardia, unspecified    Type II diabetes mellitus (HCC)    UTI (urinary tract infection)    being treated with Keflex    Ventricular septal defect    Ventriculitis of brain due to bacteria 11/02/2022   Vertebral fracture, osteoporotic (HCC) 01/06/2023   Past Surgical History:  Procedure Laterality Date   ABDOMINAL HYSTERECTOMY     BACK SURGERY     CARDIAC CATHETERIZATION N/A 06/23/2015   Procedure: Left Heart Cath and Coronary Angiography;  Surgeon: Ezra GORMAN Shuck, MD;  Location: [facility] INVASIVE CV LAB;  Service: Cardiovascular;  Laterality: N/A;   CARDIAC CATHETERIZATION  1960   VSD  was so small; didn't need repaired   COLECTOMY, SIGMOID, ROBOT-ASSISTED N/A 11/30/2023   Procedure: COLECTOMY, SIGMOID, ROBOT-ASSISTED ROBOTIC LOW ANTERIOR RECTOSIGMOID RESECTION (LAR) WITH ANASTOMOSIS COLOVAGINAL FISTULA TAKEDOWN & REPAIR OMENTOPEXY OF VAGINA INTRAOPERATIVE ASSESSMENT OF TISSUE VASCULAR PERFUSION USING ICG (indocyanine green ) IMMUNOFLUORESCENCE TRANSVERSUS ABDOMINIS PLANE (TAP) BLOCK - BILATERAL;  Surgeon: Sheldon Standing, MD;  Location: [facility] ORS;  Service: General;  Late   CYSTOSCOPY WITH INDOCYANINE GREEN  IMAGING (ICG) N/A 11/30/2023   Procedure: Cystoscopy with bilateral ureteral FireFly injections Transurethral resection of bladder tumor--large;  Surgeon: Carolee Sherwood JONETTA DOUGLAS, MD;  Location: [facility];  Service: Urology;  Laterality: N/A;   EXAM UNDER ANESTHESIA WITH MANIPULATION OF HIP Right 06/02/2014   dr sheril   FLEXIBLE SIGMOIDOSCOPY N/A 11/30/2023   Procedure: KINGSTON SIDE;  Surgeon: Sheldon Standing, MD;  Location: [facility];  Service: General;  Laterality: N/A;   FRACTURE SURGERY     HERNIA REPAIR     HIP CLOSED REDUCTION Right 06/02/2014   Procedure: CLOSED MANIPULATION HIP;  Surgeon: Maude KANDICE sheril, MD;  Location: [facility] OR;  Service: Orthopedics;  Laterality: Right;   IR FLUORO GUIDED NEEDLE PLC ASPIRATION/INJECTION LOC  08/09/2023   JOINT REPLACEMENT     JOINT REPLACEMENT  POSTERIOR LUMBAR FUSION 4 LEVEL N/A 06/26/2022   Procedure: Lumbar One To Lumbar Five Posterior Instrumented Fusion;  Surgeon: Cheryle Debby LABOR, MD;  Location: [facility] OR;  Service: Neurosurgery;  Laterality: N/A;   REFRACTIVE SURGERY Bilateral    SHOULDER ARTHROSCOPY Right    SHOULDER OPEN ROTATOR CUFF REPAIR Right    SPINAL FUSION  1996   t10 down to my coccyx   SPINE HARDWARE REMOVAL     TOTAL ABDOMINAL HYSTERECTOMY     TOTAL HIP ARTHROPLASTY Right 05/10/2014   [place]      by dr lonni olcott   TOTAL KNEE ARTHROPLASTY Left    TOTAL SHOULDER ARTHROPLASTY Left 08/19/2016    Procedure: TOTAL SHOULDER ARTHROPLASTY;  Surgeon: Eva Herring, MD;  Location: [facility] OR;  Service: Orthopedics;  Laterality: Left;  Left total shoulder replacement   Patient Active Problem List   Diagnosis Date Noted   Colovaginal fistula 11/30/2023   Diskitis 10/04/2023   Hypotension 09/06/2023   Fracture of tenth thoracic vertebra (HCC) 09/05/2023   History of discitis 09/05/2023   Senile osteoporosis 09/05/2023   History of urinary retention 09/05/2023   Vaginal atrophy 09/05/2023   Nocturia 09/05/2023   Vulvar ulcer 09/05/2023   Pulmonary nodule 08/02/2023   Non-traumatic compression fracture of vertebral column (HCC) 06/24/2023   Nonobstructive atherosclerosis of coronary artery 02/10/2023   Bilateral pseudophakia 02/03/2023   Vertebral fracture, osteoporotic (HCC) 01/06/2023   Ventriculitis of brain due to bacteria 11/02/2022   Anemia of chronic illness 10/12/2022   Bipolar disorder, in partial remission, most recent episode depressed (HCC) 10/07/2022   Adjustment disorder with mixed anxiety and depressed mood 09/30/2022   Chronic wound 09/25/2022   Hyperglycemia 09/25/2022   Chronic midline thoracic back pain 09/17/2022   Primary insomnia 09/17/2022   Chronic combined systolic and diastolic CHF (congestive heart failure) (HCC) 09/14/2022   Acute blood loss anemia 09/09/2022   Infection of lumbar spine (HCC) 09/03/2022   Malnutrition of moderate degree 08/20/2022   Hardware complicating wound infection (HCC) 08/20/2022   Infection of deep incisional surgical site after procedure 08/20/2022   Aspiration pneumonia of both lungs (HCC) 08/19/2022   Acute respiratory failure with hypoxia (HCC) 08/19/2022   Sepsis (HCC) 08/19/2022   Seizure (HCC) 08/19/2022   Wound dehiscence 08/09/2022   Delayed surgical wound healing 07/08/2022   Fracture of lumbar spine without cord injury (HCC) 06/26/2022   Lumbar vertebral fracture (HCC) 06/25/2022   Mood disorder (HCC) 09/29/2021    Generalized muscle weakness 04/30/2019   Mild cognitive impairment 09/06/2018   Hyponatremia 02/28/2018   S/P shoulder replacement, left 08/19/2016   Anxiety 10/17/2014   Acid reflux 10/17/2014   BP (high blood pressure) 10/17/2014   Arthritis, degenerative 10/17/2014   Adult hypothyroidism 10/17/2014   UTI (urinary tract infection) 06/03/2014   Fracture of bone adjacent to prosthesis 06/03/2014   Peri-prosthetic fracture of femur following total hip arthroplasty 06/02/2014   Urinary retention 05/13/2014   Chronic pain 05/10/2014   History of hip surgery 05/10/2014   Osteoarthritis 09/30/2011   UNSPECIFIED HEART FAILURE 06/24/2010   UNSPECIFIED CONGENITAL DEFECT OF SEPTAL CLOSURE 06/24/2010   Secondary cardiomyopathy (HCC) 03/18/2010   Tobacco use 03/18/2010   DM 06/13/2009   HYPERTENSION, UNSPECIFIED 06/13/2009   VENTRICULAR TACHYCARDIA 06/13/2009   VENTRICULAR SEPTAL DEFECT, CONGENITAL 06/13/2009   Essential (primary) hypertension 06/13/2009   Diabetes mellitus type 2, noninsulin dependent (HCC) 06/13/2009    ONSET DATE: 01/05/2024 MD referral  REFERRING DIAG: R26.9 (ICD-10-CM) - Gait difficulty  THERAPY DIAG:  Muscle weakness (generalized)  Unsteadiness on feet  Other abnormalities of gait and mobility  Rationale for Evaluation and Treatment: Rehabilitation  SUBJECTIVE:                                                                                                                                                                                             SUBJECTIVE STATEMENT: Walked into therapy today-didn't use the wheelchair.  I'm sore after using the weights last time.  Pt accompanied by: self  PERTINENT HISTORY: Chronic osteomyelitis with worsening back pain; Hx of multiple R hip fractures with surgery to revisions, significant leg length difference with LLE longer than RLE, hx of infection post surgery in spine (treated on antibiotics) Per MD Note 01/09/2024:     Patient was seen by Dr. Elspeth Schultze who scheduled her for surgery and on  12/01/2023 she underwent   -ROBOTIC LOW ANTERIOR RECTOSIGMOID RESECTION (LAR) WITH ANASTOMOSIS -COLOVAGINAL FISTULA TAKEDOWN & REPAIR -OMENTOPEXY OF VAGINA -INTRAOPERATIVE ASSESSMENT OF TISSUE VASCULAR PERFUSION USING ICG (indocyanine green ) IMMUNOFLUORESCENCE -TRANSVERSUS ABDOMINIS PLANE (TAP) BLOCK - BILATERAL -FLEXIBLE SIGMOIDOSCOPY    Urology did joint surgery and did    1.  Cystoscopy with bilateral ureteral FireFly injections 2.  Transurethral resection of  possible bladder tumor--large   Pathology came back with:   A. BLADDER TUMOR, TRANSURETHERAL RESECTON:  Florid cystitis cystica and papillary cystitis  Negative for carcinoma   B. RECTOSIGMOID COLON RESECTION:  Diverticula with chronic inflammation, perforation, fistula and reactive  fibrosis  Negative for carcinoma  Five benign lymph nodes (0/5)   C. FINAL DISTAL MARGIN:  Benign colonic tissue          PAIN:  Are you having pain? Yes: NPRS scale: 2/10 Pain location: R hip/R ribs Pain description: throbbing, grinding, sharp Aggravating factors: more activities, [place] Relieving factors: rest  PRECAUTIONS: Fall and Other: Recent colectomy-pt reports no restrictions Pt has history of osteoporosis and compression fractures RED FLAGS: None   WEIGHT BEARING RESTRICTIONS: No  FALLS: Has patient fallen in last 6 months? No  LIVING ENVIRONMENT: Lives with: lives with their family Lives in: House/apartment Stairs: long ramp in/out of home Has following equipment at home: Vannie - 2 wheeled and Wheelchair (manual)  PLOF: Needs assistance with gait and Vocation/Vocational requirements: retired Runner, broadcasting/film/video, Tree surgeon; enjoys spending time with grandchildren in [place] and goes to [employer] weekly  PATIENT GOALS: To get stronger, to get back on a cane.  To use walker to get to the car (long ramp), get RLE into car  better  OBJECTIVE:    TODAY'S TREATMENT: 02/09/24  Activity Comments  standing balance Heel/toe raises x 10 Lateral weightshifting x 10  -trials w/ 2 block under RLE for leg length discrepancy. --reports decreased strain on hip with accommodated position (in static standing)  Seated trunk/standing trunk strengthening-Boomwhackers to therapy ball in front of her, then come up to open/best upright posture x 10 reps, then single side lift/reach x 5  Pt performs standing with 2 block under RLE for leg length discrepancy, stands x 3 min -c/o pain in low back and hip after standing activity  BLE PRE Supported hooklying (pt requests to lie on ice pack like she does at home)--performing SAQ 3#, multi-angle isometrics for hip add, abd, hamstring 2 x15 reps  Gait out of therapy on descending ramp to car Supervision-cues to keep RW slightly closer to her as she descends ramp          Pt rates pain as 5/10 at end of session-throbbing     Access Code: [email or web address] URL: [email or web address] Date: 02/02/2024 Prepared by: [facility] - [facility]  Program Notes Seated leg slides out and in;  have a pillow case under your right foot-slide your foot out and in, 3 x 10 reps.  You can have a 3-5# ankle weight on your ankle  Exercises - Seated Hip Adduction Isometrics with Ball  - 1 x daily - 7 x weekly - 3 sets - 10 reps - Seated Hip Abduction with Resistance  - 1 x daily - 7 x weekly - 3 sets - 10 reps - Sit to Stand with Resistance Around Legs  - 1 x daily - 7 x weekly - 3 sets - 5 reps - Seated Shoulder Row with Anchored Resistance  - 1 x daily - 5 x weekly - 2 sets - 10 reps     PATIENT EDUCATION: Education details: Reprinted exercises in HEP, as pt reports she lost her copy; explained rationale for seated/standing exercises to help with postural strengthening and discussed difference between muscle soreness versus joint/bony soreness with  exercise Person educated: Patient Education method: Explanation, Demonstration, Tactile cues, Verbal cues, and Handouts Education comprehension: verbalized understanding and returned demonstration    --------------------------------------------------- Note: Objective measures were completed at Evaluation unless otherwise noted.  DIAGNOSTIC FINDINGS: MRI 09/2023   IMPRESSION: 1. Postoperative changes of T11-S1 laminectomy and posterior spinal fusion with right posterior spinal instrumentation from L2-L5. On prior CT, there is evidence of fracture through the left posterior fusion mass at L4-5, correlating with edema is seen in the left pedicle of L4 on today's study. 2. Decreased edema within the L4 and L5 vertebral bodies and posterior elements, as well as within the L4-5 and L3-4 discs, likely reactive and related to the above fracture. 3. Loculated simple fluid within the thecal sac with some marginalized cauda equina nerve roots, most consistent with arachnoiditis. 4. No spinal canal stenosis or neural foraminal narrowing.  COGNITION: Overall cognitive status: History of cognitive impairments - at baseline   SENSATION: Light touch: Impaired  and to light touch-see below Reports some numbness L foot  POSTURE: Leg length diffierence  LOWER EXTREMITY ROM:     Active  Right Eval Left Eval  Hip flexion    Hip extension    Hip abduction    Hip adduction    Hip internal rotation    Hip external rotation    Knee flexion    Knee extension    Ankle dorsiflexion    Ankle plantarflexion    Ankle inversion  Ankle eversion     (Blank rows = not tested)  LOWER EXTREMITY MMT:    MMT Right Eval Left Eval  Hip flexion 3- 3+  Hip extension    Hip abduction 4 4  Hip adduction 4 4  Hip internal rotation    Hip external rotation    Knee flexion 4 4  Knee extension 3+ 4  Ankle dorsiflexion 3+ 3+  Ankle plantarflexion    Ankle inversion    Ankle eversion    (Blank  rows = not tested)   TRANSFERS: Sit to stand: SBA  Assistive device utilized: Environmental consultant - 2 wheeled     Stand to sit: SBA  Assistive device utilized: Environmental consultant - 2 wheeled      GAIT: Findings: Gait Characteristics: excess lateral weightshift through trunk due to significant leg length difference and step through pattern, Distance walked: 50 ft, Assistive device utilized:Walker - 2 wheeled, Level of assistance: CGA, and Comments: NA  FUNCTIONAL TESTS:  5 times sit to stand: 14.44 sec with BUE support Timed up and go (TUG): 24.53 sec 2 minute walk test: NT 10 meter walk test: 21.34 sec (1.54 ft/sec) TUG cognitive: (naming colors) 28.62 sec 53M walk back:  42.16 sec with RW Stroop gait:  13.25 sec in 10 ft (naming color words)-0.75 ft/sec   18.75 sec in 10 feet (naming printed colors)-0.53 ft/sec                                                                                                                               TREATMENT DATE: 01/26/2024    PATIENT EDUCATION: Education details: Eval results, POC Person educated: Patient Education method: Explanation Education comprehension: verbalized understanding  HOME EXERCISE PROGRAM: Not yet initiated Did perform eccentric hip flexion/extension in sitting  GOALS: Goals reviewed with patient? Yes  SHORT TERM GOALS: Target date: 02/24/2024  Pt will be independent with HEP for improved strength, balance, gait. Baseline: Goal status: IN PROGRESS  2.  Pt will improve TUG score to less than or equal to 18 sec for decreased fall risk. Baseline: 24.53 sec Goal status: IN PROGRESS  3.  Pt will improve 3 M Walk backwards to less than or equal to 30 seconds for decreased fall risk. Baseline: 42.16 sec Goal status: IN PROGRESS  4.  Pt will report at least 50% improvement in car transfers.  Baseline:  difficulty with RLE  Goal status:  IN PROGRESS  LONG TERM GOALS: Target date: 03/23/2024  Pt will be independent with HEP for improved  strength, balance, gait. Baseline:  Goal status: IN PROGRESS  2.  Pt will improve gait velocity to at least 1.8 ft/sec for improved gait efficiency and safety. Baseline: 1.54 ft/sec Goal status: IN PROGRESS  3.  Pt will improve gait velocity with dual task to at least 1 ft/sec for improved gait and decreased fall risk. Baseline: 0.53-0.75 ft/sec with Stroop over 10 ft Goal status: IN PROGRESS  4.  Pt will improve 5x sit<>stand to less than or equal to 11.5 sec with light UE support to demonstrate improved functional strength and transfer efficiency. Baseline: 14.44 sec Goal status: IN PROGRESS  5.  Pt will verbalize plans for continued community fitness upon d/c from PT to maximize gains made in PT. Baseline:  Goal status: IN PROGRESS  6.  Pt will ambulate at least 200 ft in 2 minute walk, for improved gait efficiency and endurance.  Baseline: 100 ft in 1:30  Goal status:  INITIAL, 01/30/2024   ASSESSMENT:  CLINICAL IMPRESSION: Pt presents today with reports of some muscle soreness after last PT session.  She is pleased that she was able to walk into therapy using RW today, versus using her wheelchair.  Started session with gentle seated and standing exercise with slight forward lean, then upright posture/mild lateral weightshift to help with trunk/postural strengthening.  She starts off tolerating well, then quickly fatigues and reports pain after about 3 minutes of standing.  Then transitioned to supported hooklying position (with ice under low back/buttocks like she does at home for pain relief) to perform lower extremity strengthening.  By end of session, she reports pain has lessened, but still is elevated from initial rating of 2/10.  Pt will continue to benefit from skilled PT towards goals for improved functional mobility and decreased fall risk.   OBJECTIVE IMPAIRMENTS: Abnormal gait, decreased balance, decreased mobility, difficulty walking, decreased strength, impaired  sensation, postural dysfunction, and pain.   ACTIVITY LIMITATIONS: sitting, standing, transfers, locomotion level, and caring for others  PARTICIPATION LIMITATIONS: driving, shopping, community activity, and travel  PERSONAL FACTORS: 3+ comorbidities: see PMH above are also affecting patient's functional outcome.   REHAB POTENTIAL: Good  CLINICAL DECISION MAKING: Evolving/moderate complexity  EVALUATION COMPLEXITY: Moderate  PLAN:  PT FREQUENCY: 2x/week  PT DURATION: 8 weeks plus eval visit  PLANNED INTERVENTIONS: 97750- Physical Performance Testing, 97110-Therapeutic exercises, 97530- Therapeutic activity, 97112- Neuromuscular re-education, 97535- Self Care, [id number]- Manual therapy, [phone]- Gait training, Patient/Family education, and Balance training  PLAN FOR NEXT SESSION: Continue lower extremity strengthening (encourage pt to be using weights/bands at home as able, but unsure as she thinks her weights are 6#, that this may be too much).  Ask about back pain and progress standing and gait tolerance; RLE strength for car transfers; pt requests not to perform Nustep d/t hip pain; pt reports that she would like to work towards being able to walk down her ramp and into her car (reports currently limited by endurance and balance)   Greig Anon, PT 02/09/24 12:37 PM Phone: [phone] Fax: [phone]  [facility] at [facility] [address] [place], [place] [address] Phone # [phone] Fax # [phone]

## 2024-02-10 NOTE — Therapy (Incomplete)
 OUTPATIENT PHYSICAL THERAPY NEURO TREATMENT NOTE   Patient Name: [name] MRN: [id number] DOB:1956/04/13, 68 y.o., female Today's Date: 02/10/2024   PCP: Rolinda Millman, MD  REFERRING PROVIDER: Urbano Albright, MD   END OF SESSION:       Past Medical History:  Diagnosis Date   Anemia    Anxiety    Arthritis    Bipolar disorder (HCC)    Cardiomyopathy (HCC)    Complication of anesthesia    Awake but could not move   Depression    Diskitis 10/04/2023   GERD (gastroesophageal reflux disease)    Heart failure (HCC)    Acute   Heart murmur    related to VSD   High cholesterol    History of blood transfusion    related to OR (08/19/2016)   History of hiatal hernia    Hypertension    Hyperthyroidism    Hypotension 09/06/2023   Mild cognitive impairment 09/06/2018   Paroxysmal ventricular tachycardia (HCC)    Pneumonia    Rectovaginal fistula 09/06/2023   Tachycardia, unspecified    Type II diabetes mellitus (HCC)    UTI (urinary tract infection)    being treated with Keflex    Ventricular septal defect    Ventriculitis of brain due to bacteria 11/02/2022   Vertebral fracture, osteoporotic (HCC) 01/06/2023   Past Surgical History:  Procedure Laterality Date   ABDOMINAL HYSTERECTOMY     BACK SURGERY     CARDIAC CATHETERIZATION N/A 06/23/2015   Procedure: Left Heart Cath and Coronary Angiography;  Surgeon: Ezra GORMAN Shuck, MD;  Location: [facility] INVASIVE CV LAB;  Service: Cardiovascular;  Laterality: N/A;   CARDIAC CATHETERIZATION  1960   VSD was so small; didn't need repaired   COLECTOMY, SIGMOID, ROBOT-ASSISTED N/A 11/30/2023   Procedure: COLECTOMY, SIGMOID, ROBOT-ASSISTED ROBOTIC LOW ANTERIOR RECTOSIGMOID RESECTION (LAR) WITH ANASTOMOSIS COLOVAGINAL FISTULA TAKEDOWN & REPAIR OMENTOPEXY OF VAGINA INTRAOPERATIVE ASSESSMENT OF TISSUE VASCULAR PERFUSION USING ICG (indocyanine green ) IMMUNOFLUORESCENCE TRANSVERSUS ABDOMINIS PLANE (TAP) BLOCK - BILATERAL;   Surgeon: Sheldon Standing, MD;  Location: [facility] OR[facility];  Service: General;  Late   CYSTOSCOPY WITH INDOCYANINE GREEN  IMAGING (ICG) N/A 11/30/2023   Procedure: Cystoscopy with bilateral ureteral FireFly injections Transurethral resection of bladder tumor--large;  Surgeon: Carolee Sherwood JONETTA DOUGLAS, MD;  Location: [facility];  Service: Urology;  Laterality: N/A;   EXAM UNDER ANESTHESIA WITH MANIPULATION OF HIP Right 06/02/2014   dr sheril   FLEXIBLE SIGMOIDOSCOPY N/A 11/30/2023   Procedure: KINGSTON SIDE;  Surgeon: Sheldon Standing, MD;  Location: [facility];  Service: General;  Laterality: N/A;   FRACTURE SURGERY     HERNIA REPAIR     HIP CLOSED REDUCTION Right 06/02/2014   Procedure: CLOSED MANIPULATION HIP;  Surgeon: Maude KANDICE sheril, MD;  Location: [facility] OR;  Service: Orthopedics;  Laterality: Right;   IR FLUORO GUIDED NEEDLE PLC ASPIRATION/INJECTION LOC  08/09/2023   JOINT REPLACEMENT     JOINT REPLACEMENT     POSTERIOR LUMBAR FUSION 4 LEVEL N/A 06/26/2022   Procedure: Lumbar One To Lumbar Five Posterior Instrumented Fusion;  Surgeon: Cheryle Debby LABOR, MD;  Location: [facility] OR;  Service: Neurosurgery;  Laterality: N/A;   REFRACTIVE SURGERY Bilateral    SHOULDER ARTHROSCOPY Right    SHOULDER OPEN ROTATOR CUFF REPAIR Right    SPINAL FUSION  1996   t10 down to my coccyx   SPINE HARDWARE REMOVAL     TOTAL ABDOMINAL HYSTERECTOMY     TOTAL HIP ARTHROPLASTY Right 05/10/2014   [place]  by dr lonni ditty   TOTAL KNEE ARTHROPLASTY Left    TOTAL SHOULDER ARTHROPLASTY Left 08/19/2016   Procedure: TOTAL SHOULDER ARTHROPLASTY;  Surgeon: Eva Herring, MD;  Location: [facility] OR;  Service: Orthopedics;  Laterality: Left;  Left total shoulder replacement   Patient Active Problem List   Diagnosis Date Noted   Colovaginal fistula 11/30/2023   Diskitis 10/04/2023   Hypotension 09/06/2023   Fracture of tenth thoracic vertebra (HCC) 09/05/2023   History of discitis 09/05/2023   Senile osteoporosis 09/05/2023    History of urinary retention 09/05/2023   Vaginal atrophy 09/05/2023   Nocturia 09/05/2023   Vulvar ulcer 09/05/2023   Pulmonary nodule 08/02/2023   Non-traumatic compression fracture of vertebral column (HCC) 06/24/2023   Nonobstructive atherosclerosis of coronary artery 02/10/2023   Bilateral pseudophakia 02/03/2023   Vertebral fracture, osteoporotic (HCC) 01/06/2023   Ventriculitis of brain due to bacteria 11/02/2022   Anemia of chronic illness 10/12/2022   Bipolar disorder, in partial remission, most recent episode depressed (HCC) 10/07/2022   Adjustment disorder with mixed anxiety and depressed mood 09/30/2022   Chronic wound 09/25/2022   Hyperglycemia 09/25/2022   Chronic midline thoracic back pain 09/17/2022   Primary insomnia 09/17/2022   Chronic combined systolic and diastolic CHF (congestive heart failure) (HCC) 09/14/2022   Acute blood loss anemia 09/09/2022   Infection of lumbar spine (HCC) 09/03/2022   Malnutrition of moderate degree 08/20/2022   Hardware complicating wound infection (HCC) 08/20/2022   Infection of deep incisional surgical site after procedure 08/20/2022   Aspiration pneumonia of both lungs (HCC) 08/19/2022   Acute respiratory failure with hypoxia (HCC) 08/19/2022   Sepsis (HCC) 08/19/2022   Seizure (HCC) 08/19/2022   Wound dehiscence 08/09/2022   Delayed surgical wound healing 07/08/2022   Fracture of lumbar spine without cord injury (HCC) 06/26/2022   Lumbar vertebral fracture (HCC) 06/25/2022   Mood disorder (HCC) 09/29/2021   Generalized muscle weakness 04/30/2019   Mild cognitive impairment 09/06/2018   Hyponatremia 02/28/2018   S/P shoulder replacement, left 08/19/2016   Anxiety 10/17/2014   Acid reflux 10/17/2014   BP (high blood pressure) 10/17/2014   Arthritis, degenerative 10/17/2014   Adult hypothyroidism 10/17/2014   UTI (urinary tract infection) 06/03/2014   Fracture of bone adjacent to prosthesis 06/03/2014   Peri-prosthetic  fracture of femur following total hip arthroplasty 06/02/2014   Urinary retention 05/13/2014   Chronic pain 05/10/2014   History of hip surgery 05/10/2014   Osteoarthritis 09/30/2011   UNSPECIFIED HEART FAILURE 06/24/2010   UNSPECIFIED CONGENITAL DEFECT OF SEPTAL CLOSURE 06/24/2010   Secondary cardiomyopathy (HCC) 03/18/2010   Tobacco use 03/18/2010   DM 06/13/2009   HYPERTENSION, UNSPECIFIED 06/13/2009   VENTRICULAR TACHYCARDIA 06/13/2009   VENTRICULAR SEPTAL DEFECT, CONGENITAL 06/13/2009   Essential (primary) hypertension 06/13/2009   Diabetes mellitus type 2, noninsulin dependent (HCC) 06/13/2009    ONSET DATE: 01/05/2024 MD referral  REFERRING DIAG: R26.9 (ICD-10-CM) - Gait difficulty   THERAPY DIAG:  No diagnosis found.  Rationale for Evaluation and Treatment: Rehabilitation  SUBJECTIVE:  SUBJECTIVE STATEMENT: Walked into therapy today-didn't use the wheelchair.  I'm sore after using the weights last time.  Pt accompanied by: self  PERTINENT HISTORY: Chronic osteomyelitis with worsening back pain; Hx of multiple R hip fractures with surgery to revisions, significant leg length difference with LLE longer than RLE, hx of infection post surgery in spine (treated on antibiotics) Per MD Note 01/09/2024:    Patient was seen by Dr. Elspeth Schultze who scheduled her for surgery and on  12/01/2023 she underwent   -ROBOTIC LOW ANTERIOR RECTOSIGMOID RESECTION (LAR) WITH ANASTOMOSIS -COLOVAGINAL FISTULA TAKEDOWN & REPAIR -OMENTOPEXY OF VAGINA -INTRAOPERATIVE ASSESSMENT OF TISSUE VASCULAR PERFUSION USING ICG (indocyanine green ) IMMUNOFLUORESCENCE -TRANSVERSUS ABDOMINIS PLANE (TAP) BLOCK - BILATERAL -FLEXIBLE SIGMOIDOSCOPY    Urology did joint surgery and did    1.  Cystoscopy with bilateral ureteral  FireFly injections 2.  Transurethral resection of  possible bladder tumor--large   Pathology came back with:   A. BLADDER TUMOR, TRANSURETHERAL RESECTON:  Florid cystitis cystica and papillary cystitis  Negative for carcinoma   B. RECTOSIGMOID COLON RESECTION:  Diverticula with chronic inflammation, perforation, fistula and reactive  fibrosis  Negative for carcinoma  Five benign lymph nodes (0/5)   C. FINAL DISTAL MARGIN:  Benign colonic tissue          PAIN:  Are you having pain? Yes: NPRS scale: 2/10 Pain location: R hip/R ribs Pain description: throbbing, grinding, sharp Aggravating factors: more activities, [place] Relieving factors: rest  PRECAUTIONS: Fall and Other: Recent colectomy-pt reports no restrictions Pt has history of osteoporosis and compression fractures RED FLAGS: None   WEIGHT BEARING RESTRICTIONS: No  FALLS: Has patient fallen in last 6 months? No  LIVING ENVIRONMENT: Lives with: lives with their family Lives in: House/apartment Stairs: long ramp in/out of home Has following equipment at home: Vannie - 2 wheeled and Wheelchair (manual)  PLOF: Needs assistance with gait and Vocation/Vocational requirements: retired Runner, broadcasting/film/video, Tree surgeon; enjoys spending time with grandchildren in [place] and goes to [employer] weekly  PATIENT GOALS: To get stronger, to get back on a cane.  To use walker to get to the car (long ramp), get RLE into car better  OBJECTIVE:     TODAY'S TREATMENT: 02/13/24 Activity Comments                        TODAY'S TREATMENT: 02/09/24 Activity Comments  standing balance Heel/toe raises x 10 Lateral weightshifting x 10  -trials w/ 2 block under RLE for leg length discrepancy. --reports decreased strain on hip with accommodated position (in static standing)  Seated trunk/standing trunk strengthening-Boomwhackers to therapy ball in front of her, then come up to open/best upright posture x 10 reps,  then single side lift/reach x 5  Pt performs standing with 2 block under RLE for leg length discrepancy, stands x 3 min -c/o pain in low back and hip after standing activity  BLE PRE Supported hooklying (pt requests to lie on ice pack like she does at home)--performing SAQ 3#, multi-angle isometrics for hip add, abd, hamstring 2 x15 reps  Gait out of therapy on descending ramp to car Supervision-cues to keep RW slightly closer to her as she descends ramp          Pt rates pain as 5/10 at end of session-throbbing     Access Code: [email or web address] URL: [email or web address] Date: 02/02/2024 Prepared by: [facility] - Outpatient  Rehab - [facility]  Program Notes Seated  leg slides out and in;  have a pillow case under your right foot-slide your foot out and in, 3 x 10 reps.  You can have a 3-5# ankle weight on your ankle  Exercises - Seated Hip Adduction Isometrics with Ball  - 1 x daily - 7 x weekly - 3 sets - 10 reps - Seated Hip Abduction with Resistance  - 1 x daily - 7 x weekly - 3 sets - 10 reps - Sit to Stand with Resistance Around Legs  - 1 x daily - 7 x weekly - 3 sets - 5 reps - Seated Shoulder Row with Anchored Resistance  - 1 x daily - 5 x weekly - 2 sets - 10 reps     PATIENT EDUCATION: Education details: Reprinted exercises in HEP, as pt reports she lost her copy; explained rationale for seated/standing exercises to help with postural strengthening and discussed difference between muscle soreness versus joint/bony soreness with exercise Person educated: Patient Education method: Explanation, Demonstration, Tactile cues, Verbal cues, and Handouts Education comprehension: verbalized understanding and returned demonstration    --------------------------------------------------- Note: Objective measures were completed at Evaluation unless otherwise noted.  DIAGNOSTIC FINDINGS: MRI 09/2023   IMPRESSION: 1. Postoperative changes of T11-S1 laminectomy and  posterior spinal fusion with right posterior spinal instrumentation from L2-L5. On prior CT, there is evidence of fracture through the left posterior fusion mass at L4-5, correlating with edema is seen in the left pedicle of L4 on today's study. 2. Decreased edema within the L4 and L5 vertebral bodies and posterior elements, as well as within the L4-5 and L3-4 discs, likely reactive and related to the above fracture. 3. Loculated simple fluid within the thecal sac with some marginalized cauda equina nerve roots, most consistent with arachnoiditis. 4. No spinal canal stenosis or neural foraminal narrowing.  COGNITION: Overall cognitive status: History of cognitive impairments - at baseline   SENSATION: Light touch: Impaired  and to light touch-see below Reports some numbness L foot  POSTURE: Leg length diffierence  LOWER EXTREMITY ROM:     Active  Right Eval Left Eval  Hip flexion    Hip extension    Hip abduction    Hip adduction    Hip internal rotation    Hip external rotation    Knee flexion    Knee extension    Ankle dorsiflexion    Ankle plantarflexion    Ankle inversion    Ankle eversion     (Blank rows = not tested)  LOWER EXTREMITY MMT:    MMT Right Eval Left Eval  Hip flexion 3- 3+  Hip extension    Hip abduction 4 4  Hip adduction 4 4  Hip internal rotation    Hip external rotation    Knee flexion 4 4  Knee extension 3+ 4  Ankle dorsiflexion 3+ 3+  Ankle plantarflexion    Ankle inversion    Ankle eversion    (Blank rows = not tested)   TRANSFERS: Sit to stand: SBA  Assistive device utilized: Environmental consultant - 2 wheeled     Stand to sit: SBA  Assistive device utilized: Environmental consultant - 2 wheeled      GAIT: Findings: Gait Characteristics: excess lateral weightshift through trunk due to significant leg length difference and step through pattern, Distance walked: 50 ft, Assistive device utilized:Walker - 2 wheeled, Level of assistance: CGA, and Comments:  NA  FUNCTIONAL TESTS:  5 times sit to stand: 14.44 sec with BUE support Timed up and go (  TUG): 24.53 sec 2 minute walk test: NT 10 meter walk test: 21.34 sec (1.54 ft/sec) TUG cognitive: (naming colors) 28.62 sec 2M walk back:  42.16 sec with RW Stroop gait:  13.25 sec in 10 ft (naming color words)-0.75 ft/sec   18.75 sec in 10 feet (naming printed colors)-0.53 ft/sec                                                                                                                               TREATMENT DATE: 01/26/2024    PATIENT EDUCATION: Education details: Eval results, POC Person educated: Patient Education method: Explanation Education comprehension: verbalized understanding  HOME EXERCISE PROGRAM: Not yet initiated Did perform eccentric hip flexion/extension in sitting  GOALS: Goals reviewed with patient? Yes  SHORT TERM GOALS: Target date: 02/24/2024  Pt will be independent with HEP for improved strength, balance, gait. Baseline: Goal status: IN PROGRESS  2.  Pt will improve TUG score to less than or equal to 18 sec for decreased fall risk. Baseline: 24.53 sec Goal status: IN PROGRESS  3.  Pt will improve 3 M Walk backwards to less than or equal to 30 seconds for decreased fall risk. Baseline: 42.16 sec Goal status: IN PROGRESS  4.  Pt will report at least 50% improvement in car transfers.  Baseline:  difficulty with RLE  Goal status:  IN PROGRESS  LONG TERM GOALS: Target date: 03/23/2024  Pt will be independent with HEP for improved strength, balance, gait. Baseline:  Goal status: IN PROGRESS  2.  Pt will improve gait velocity to at least 1.8 ft/sec for improved gait efficiency and safety. Baseline: 1.54 ft/sec Goal status: IN PROGRESS  3.  Pt will improve gait velocity with dual task to at least 1 ft/sec for improved gait and decreased fall risk. Baseline: 0.53-0.75 ft/sec with Stroop over 10 ft Goal status: IN PROGRESS  4.  Pt will improve 5x  sit<>stand to less than or equal to 11.5 sec with light UE support to demonstrate improved functional strength and transfer efficiency. Baseline: 14.44 sec Goal status: IN PROGRESS  5.  Pt will verbalize plans for continued community fitness upon d/c from PT to maximize gains made in PT. Baseline:  Goal status: IN PROGRESS  6.  Pt will ambulate at least 200 ft in 2 minute walk, for improved gait efficiency and endurance.  Baseline: 100 ft in 1:30  Goal status:  INITIAL, 01/30/2024   ASSESSMENT:  CLINICAL IMPRESSION: Pt presents today with reports of some muscle soreness after last PT session.  She is pleased that she was able to walk into therapy using RW today, versus using her wheelchair.  Started session with gentle seated and standing exercise with slight forward lean, then upright posture/mild lateral weightshift to help with trunk/postural strengthening.  She starts off tolerating well, then quickly fatigues and reports pain after about 3 minutes of standing.  Then transitioned to supported hooklying position (with ice under low back/buttocks  like she does at home for pain relief) to perform lower extremity strengthening.  By end of session, she reports pain has lessened, but still is elevated from initial rating of 2/10.  Pt will continue to benefit from skilled PT towards goals for improved functional mobility and decreased fall risk.   OBJECTIVE IMPAIRMENTS: Abnormal gait, decreased balance, decreased mobility, difficulty walking, decreased strength, impaired sensation, postural dysfunction, and pain.   ACTIVITY LIMITATIONS: sitting, standing, transfers, locomotion level, and caring for others  PARTICIPATION LIMITATIONS: driving, shopping, community activity, and travel  PERSONAL FACTORS: 3+ comorbidities: see PMH above are also affecting patient's functional outcome.   REHAB POTENTIAL: Good  CLINICAL DECISION MAKING: Evolving/moderate complexity  EVALUATION COMPLEXITY:  Moderate  PLAN:  PT FREQUENCY: 2x/week  PT DURATION: 8 weeks plus eval visit  PLANNED INTERVENTIONS: 97750- Physical Performance Testing, 97110-Therapeutic exercises, 97530- Therapeutic activity, 97112- Neuromuscular re-education, 97535- Self Care, [id number]- Manual therapy, [phone]- Gait training, Patient/Family education, and Balance training  PLAN FOR NEXT SESSION: Continue lower extremity strengthening (encourage pt to be using weights/bands at home as able, but unsure as she thinks her weights are 6#, that this may be too much).  Ask about back pain and progress standing and gait tolerance; RLE strength for car transfers; pt requests not to perform Nustep d/t hip pain; pt reports that she would like to work towards being able to walk down her ramp and into her car (reports currently limited by endurance and balance)   Greig Anon, PT 02/10/24 8:22 AM Phone: [phone] Fax: [phone]  [facility] at [facility] [address] [place], [place] [address] Phone # [phone] Fax # [phone]

## 2024-02-13 ENCOUNTER — Encounter: Payer: Self-pay | Admitting: Physical Medicine & Rehabilitation

## 2024-02-13 ENCOUNTER — Ambulatory Visit: Admitting: Physical Therapy

## 2024-02-13 ENCOUNTER — Telehealth: Payer: Self-pay | Admitting: Emergency Medicine

## 2024-02-13 ENCOUNTER — Encounter: Attending: Physical Medicine & Rehabilitation | Admitting: Physical Medicine & Rehabilitation

## 2024-02-13 VITALS — BP 151/91 | HR 75 | Ht 60.0 in | Wt 146.0 lb

## 2024-02-13 DIAGNOSIS — M546 Pain in thoracic spine: Secondary | ICD-10-CM | POA: Insufficient documentation

## 2024-02-13 DIAGNOSIS — G8929 Other chronic pain: Secondary | ICD-10-CM | POA: Insufficient documentation

## 2024-02-13 DIAGNOSIS — M4626 Osteomyelitis of vertebra, lumbar region: Secondary | ICD-10-CM | POA: Insufficient documentation

## 2024-02-13 MED ORDER — SUZETRIGINE 50 MG PO TABS: 50.0000 mg | ORAL_TABLET | Freq: Two times a day (BID) | ORAL | 1 refills | Status: DC | PRN

## 2024-02-13 NOTE — Progress Notes (Signed)
 Subjective:    Patient ID: Almarie KANDICE Miser, female    DOB: 1956/03/31, 68 y.o.   MRN: [id number]  HPI  HPI   Excerpt from discharge summary 10/13/2022 Brief HPI:   MAKAYLIN CARLO is a 68 y.o. female with history of PVT, T2DM, bipolar d/o, mild cognitive impairments, right femur fracture with RLE leg length discrepancy and gait disorder, fall 05/2022 with 3 column unstable fracture through prior scoliosis construct s/p instrumentation with exposed hardware and wound dehiscence. She was admitted on 08/09/22 for revision of lumbar wound with removal of hardware and revision L1-L5 fusion by Dr. Cheryle. Post op course was significant for delirium with fevers, PNA with respiratory failure requiring intubation 2/01. MRI brain showed evidence of ventriculitis and MRI spine showed widely patent fusion with decrease in bone marrow edema L3.    Dr. Fleeta Rothman recommended IV antibiotics X 6 weeks empirically with flagyl  to cover aspiration PNA. She required tracheostomy as well as foley for urinary retention and rectal tube due to frequent loose stools and to prevent wound contamination. WOC was following for wound VAC changes. She was downsized to CFS#4 prior prior to d/c. PT/OT was working with patient who was limited by weakness, cognitive deficits requiring increased time for processing as well as verbal and tactile cues to follow simple commands as well as max to total assist with ADLs and mobility. CIR was recommended due to functional decline.       Hospital Course: BRAYLEI TOTINO was admitted to rehab 09/03/2022 for inpatient therapies to consist of PT, ST and OT at least three hours five days a week. Past admission physiatrist, therapy team and rehab RN have worked together to provide customized collaborative inpatient rehab. Her blood pressures were monitored on TID basis and have been stable. As mentation and swallow function improved, diet was advanced to regular textures. She was  tolerating PMSV and was decannulated without difficulty. Her diabetes has been monitored with ac/hs CBG checks and was being managed with SSI due to variable intake and was reasonably controlled. As swallow function improved, she was advanced to regular textures, intake has improved with family providing food from home. She did request resumption of metformin  and d/c of CBG checks. She is tolerating metformin  without SE except for am nausea which could be due to transition to oral antibiotics. Patient has been educated on importance of BS management to promote wound healing and recommended to  monitor this post d/c. Her CBG checks d/c as BS controlled and have been monitored weekly with weekly labs.    She was maintained on Vanc/cefepime  thru 03/14 and as follow up MRI brain 03/13 showed  resolution of ventriculitis, she was transitioned to Levaquin  and doxycycline  bid per ID input. She is to continue on oral antibiotics X 4 weeks and follow up at [facility] on 04/16 for final decision on duration of antibiotic treatment. WOC has been following to give input on patient's wound care and for monitoring. Wound VAC was discontinued on 02/21 and wet to dry dressing changes with Aquacel Ag was initiated. She did develop some slough on wound bed and metahoney added initially but changed to santyl  which has helped to debride it. Patient did report that she plans  on showering at home and WOC recommended changing back to Aquacel for antibacterial protection.    Ensure and protein supplements added to help promote wound healing as as mentation improved but she had declined these frequently.  Voiding trial was attempted with  increase in tamsulosin  as well as addition of urecholine . She continued to require I/O caths and patient elected on  foley with plans for voiding trial on outpatient basis.  She has had high levels of anxiety and ego support has been provided by rehab team. Her mentation has improved with cognition trending  back to baseline. She expressed concerns about her psychiatric medication regimen as multiple meds has been d/c while on acute and requested psychiatry input. Her home Wellbutrin  was resumed on 03/21 and Dr. Fredia who evaluated patient felt that patient was at her baseline and no additional meds were recommended.    Pain control is improving and she has been educated on importance of weaning narcotics gradually. She continues Butran's patch as well as low dose gabapentin  as sedation was reported with attempts at up titration of latter.   She started making progress and LOS was extended by a week to help achieve higher goals. She requires min assist with ADL and mobility. She will continue to receive follow up HHPT, HHOT, HHST, HHaide and HHRN by [facility] after discharge.      Rehab course: During patient's stay in rehab weekly team conferences were held to monitor patient's progress, set goals and discuss barriers to discharge. At admission, patient required +2 total assist with ADL tasks and mod to total assist with pregait activity. She exhibited deficits in recall, attention, insight and required min to mod assist for processing and attention to tasks. She  has had improvement in activity tolerance, balance, postural control as well as ability to compensate for deficits. She has had improvement in functional use RUE and RLE as well as improvement in awareness. She requires min assist with ADL tasks.  She requires CGA for transfers and to ambulate 42 feet with RW and cues for sequencing.  She is able to complete functional cognitive tasks with min assist and requires min cues to self correct errors and for re-direction to tasks. Family education has been completed and family has hired caregivers to assist with care.     Interval history 12/10/2022 Ms. Kimbrell is here for follow-up after CIR and above treatment.  She was also seen by Fidela Ned on 11/09/2022.  She reports she is continue to  work with physical therapy however she was discharged by OT.  She says after several weeks she may be able to transition to outpatient therapy.  Foley has been removed and she is voiding on her own, this does require to get up more frequently.  Her back pain is currently controlled with gabapentin , buprenorphine  that was increased to 10 mcg/h patch and as needed oxycodone .  She has been seen by Dr. Fleeta Rothman infectious disease and is continued on antibiotics of doxycycline  and Levaquin .  She continues to follow-up with Dr. Rockney.  She had a CT of her T-spine and L-spine recently that showed a new compression fracture.   Interval history 03/15/2023 Ms. Brockway is here for follow-up regarding conditions listed above.  She reports she is doing well overall.  She continues to work with physical therapy and Occupational Therapy and feels like she is making good progress.  She says she is discharged from her wound care clinic because her lower back wound is now closed.  She is no longer on tramadol , buprenorphine  or oxycodone  and is now on Flexeril  for her back pain.  She feels like therapy is causing some soreness in her back.  Pain does not shoot down her legs.  Reports  she has a TENS and this provides minimal benefit.   Interval history 06/30/23 Patient is here for follow-up.  She reports she was doing very well with outpatient PT and OT and making good progress.  She later developed worsening thoracic pain and was found to have a T10 compression fracture.  She reports Dr. Rockney has retired but she is planning to follow-up with Dr. Carollee. She has been treated with Norco 5.  She reports she would like to restart PT after she has her potential kyphoplasty.  Interval history 10/20/23 Patient reports that she continues to have pain and issues with her lower back.  She continues to follow-up with ID for vertebral osteomyelitis.  She remains on IV antibiotics but it sounds like infectious disease planning to  switch her to oral medications.  She also reports rectovaginal fistula.  Patient been seen by Dr. Almarie Molt with neurosurgery at [facility].  She reports surgeons do not want to do any additional operations due to spinal infection.  She continues to have severe lower back pain.  Pain overall has been controlled with Norco 5 mg and buprenorphine  10 mcg/h.  This she also takes gabapentin  100 mg 3 times daily.  Interval history 02/13/24 Patient has been going to neuro PT, reports this has been beneficial to her mobility and function.  Continues on antibiotics for osteomyelitis/discitis, doxycycline  followed by ID.  Reports she has been careful with photosensitivity.  Reports she spoke with a different provider where options of  gummy type (CBD vs THC?) products versus other pain medicines were discussed.  She tried some Gummies and found them to be helpful and has been using them intermittently.    Pain Inventory Average Pain 5 Pain Right Now 5 My pain is constant, sharp, burning, and aching  In the last 24 hours, has pain interfered with the following? General activity 10 Relation with others 7 Enjoyment of life 10 What TIME of day is your pain at its worst? morning  and evening Sleep (in general) Poor  Pain is worse with: bending, sitting, and some activites Pain improves with: rest, heat/ice, and medication Relief from Meds: 8  Family History  Problem Relation Age of Onset   Other Mother        alive   Stroke Father 72       deceased   Dementia Father    Chorea Maternal Grandfather    Dementia Maternal Aunt    Dementia Maternal Aunt    Heart attack Other        multiple uncles have died with myocardial infarction   Bladder Cancer Neg Hx    Uterine cancer Neg Hx    Social History   Socioeconomic History   Marital status: Married    Spouse name: Not on file   Number of children: Not on file   Years of education: Not on file   Highest education level: Master's degree (e.g., MA,  MS, MEng, MEd, MSW, MBA)  Occupational History   Occupation: Magazine features editor: SELF-EMPLOYED    Comment: former  Tobacco Use   Smoking status: Former    Types: Cigarettes    Passive exposure: Never   Smokeless tobacco: Never   Tobacco comments:    03/30/21 smokes 2 cigs daily.      08/06/22 GLENWOOD Raisin stated that the patient is not smoking while at [facility] for rehab. KM    11-23-23 has been quit for 3 weeks  Vaping Use   Vaping  status: Never Used  Substance and Sexual Activity   Alcohol use: No   Drug use: No   Sexual activity: Not Currently    Partners: Male    Birth control/protection: Surgical    Comment: Hysterectomy  Other Topics Concern   Not on file  Social History Narrative   Right handed   Caffeine  2-3 cups daily    Lives at home with husband    Social Drivers of Health   Financial Resource Strain: Low Risk  (07/08/2023)   Received from [facility]   Overall Financial Resource Strain (CARDIA)    Difficulty of Paying Living Expenses: Not hard at all  Food Insecurity: No Food Insecurity (11/30/2023)   Hunger Vital Sign    Worried About Running Out of Food in the Last Year: Never true    Ran Out of Food in the Last Year: Never true  Transportation Needs: No Transportation Needs (11/30/2023)   PRAPARE - Administrator, Civil Service (Medical): No    Lack of Transportation (Non-Medical): No  Physical Activity: Not on file  Stress: Not on file  Social Connections: Socially Integrated (11/30/2023)   Social Connection and Isolation Panel    Frequency of Communication with Friends and Family: Twice a week    Frequency of Social Gatherings with Friends and Family: Twice a week    Attends Religious Services: 1 to 4 times per year    Active Member of Clubs or Organizations: No    Attends Engineer, structural: 1 to 4 times per year    Marital Status: Married   Past Surgical History:  Procedure Laterality Date   ABDOMINAL  HYSTERECTOMY     BACK SURGERY     CARDIAC CATHETERIZATION N/A 06/23/2015   Procedure: Left Heart Cath and Coronary Angiography;  Surgeon: Ezra GORMAN Shuck, MD;  Location: [facility] INVASIVE CV LAB;  Service: Cardiovascular;  Laterality: N/A;   CARDIAC CATHETERIZATION  1960   VSD was so small; didn't need repaired   COLECTOMY, SIGMOID, ROBOT-ASSISTED N/A 11/30/2023   Procedure: COLECTOMY, SIGMOID, ROBOT-ASSISTED ROBOTIC LOW ANTERIOR RECTOSIGMOID RESECTION (LAR) WITH ANASTOMOSIS COLOVAGINAL FISTULA TAKEDOWN & REPAIR OMENTOPEXY OF VAGINA INTRAOPERATIVE ASSESSMENT OF TISSUE VASCULAR PERFUSION USING ICG (indocyanine green ) IMMUNOFLUORESCENCE TRANSVERSUS ABDOMINIS PLANE (TAP) BLOCK - BILATERAL;  Surgeon: Sheldon Standing, MD;  Location: [facility] ORS;  Service: General;  Late   CYSTOSCOPY WITH INDOCYANINE GREEN  IMAGING (ICG) N/A 11/30/2023   Procedure: Cystoscopy with bilateral ureteral FireFly injections Transurethral resection of bladder tumor--large;  Surgeon: Carolee Sherwood JONETTA DOUGLAS, MD;  Location: [facility] ORS;  Service: Urology;  Laterality: N/A;   EXAM UNDER ANESTHESIA WITH MANIPULATION OF HIP Right 06/02/2014   dr sheril   FLEXIBLE SIGMOIDOSCOPY N/A 11/30/2023   Procedure: KINGSTON SIDE;  Surgeon: Sheldon Standing, MD;  Location: [facility] ORS;  Service: General;  Laterality: N/A;   FRACTURE SURGERY     HERNIA REPAIR     HIP CLOSED REDUCTION Right 06/02/2014   Procedure: CLOSED MANIPULATION HIP;  Surgeon: Maude KANDICE sheril, MD;  Location: [facility] OR;  Service: Orthopedics;  Laterality: Right;   IR FLUORO GUIDED NEEDLE PLC ASPIRATION/INJECTION LOC  08/09/2023   JOINT REPLACEMENT     JOINT REPLACEMENT     POSTERIOR LUMBAR FUSION 4 LEVEL N/A 06/26/2022   Procedure: Lumbar One To Lumbar Five Posterior Instrumented Fusion;  Surgeon: Cheryle Debby LABOR, MD;  Location: [facility] OR;  Service: Neurosurgery;  Laterality: N/A;   REFRACTIVE SURGERY Bilateral    SHOULDER ARTHROSCOPY Right  SHOULDER OPEN ROTATOR CUFF REPAIR Right    SPINAL  FUSION  1996   t10 down to my coccyx   SPINE HARDWARE REMOVAL     TOTAL ABDOMINAL HYSTERECTOMY     TOTAL HIP ARTHROPLASTY Right 05/10/2014   [place]      by dr lonni olcott   TOTAL KNEE ARTHROPLASTY Left    TOTAL SHOULDER ARTHROPLASTY Left 08/19/2016   Procedure: TOTAL SHOULDER ARTHROPLASTY;  Surgeon: Eva Herring, MD;  Location: [facility] OR;  Service: Orthopedics;  Laterality: Left;  Left total shoulder replacement   Past Surgical History:  Procedure Laterality Date   ABDOMINAL HYSTERECTOMY     BACK SURGERY     CARDIAC CATHETERIZATION N/A 06/23/2015   Procedure: Left Heart Cath and Coronary Angiography;  Surgeon: Ezra GORMAN Shuck, MD;  Location: [facility] INVASIVE CV LAB;  Service: Cardiovascular;  Laterality: N/A;   CARDIAC CATHETERIZATION  1960   VSD was so small; didn't need repaired   COLECTOMY, SIGMOID, ROBOT-ASSISTED N/A 11/30/2023   Procedure: COLECTOMY, SIGMOID, ROBOT-ASSISTED ROBOTIC LOW ANTERIOR RECTOSIGMOID RESECTION (LAR) WITH ANASTOMOSIS COLOVAGINAL FISTULA TAKEDOWN & REPAIR OMENTOPEXY OF VAGINA INTRAOPERATIVE ASSESSMENT OF TISSUE VASCULAR PERFUSION USING ICG (indocyanine green ) IMMUNOFLUORESCENCE TRANSVERSUS ABDOMINIS PLANE (TAP) BLOCK - BILATERAL;  Surgeon: Sheldon Standing, MD;  Location: [facility];  Service: General;  Late   CYSTOSCOPY WITH INDOCYANINE GREEN  IMAGING (ICG) N/A 11/30/2023   Procedure: Cystoscopy with bilateral ureteral FireFly injections Transurethral resection of bladder tumor--large;  Surgeon: Carolee Sherwood JONETTA DOUGLAS, MD;  Location: [facility];  Service: Urology;  Laterality: N/A;   EXAM UNDER ANESTHESIA WITH MANIPULATION OF HIP Right 06/02/2014   dr sheril   FLEXIBLE SIGMOIDOSCOPY N/A 11/30/2023   Procedure: KINGSTON SIDE;  Surgeon: Sheldon Standing, MD;  Location: [facility];  Service: General;  Laterality: N/A;   FRACTURE SURGERY     HERNIA REPAIR     HIP CLOSED REDUCTION Right 06/02/2014   Procedure: CLOSED MANIPULATION HIP;  Surgeon: Maude KANDICE sheril, MD;   Location: [facility];  Service: Orthopedics;  Laterality: Right;   IR FLUORO GUIDED NEEDLE PLC ASPIRATION/INJECTION LOC  08/09/2023   JOINT REPLACEMENT     JOINT REPLACEMENT     POSTERIOR LUMBAR FUSION 4 LEVEL N/A 06/26/2022   Procedure: Lumbar One To Lumbar Five Posterior Instrumented Fusion;  Surgeon: Cheryle Debby LABOR, MD;  Location: [facility];  Service: Neurosurgery;  Laterality: N/A;   REFRACTIVE SURGERY Bilateral    SHOULDER ARTHROSCOPY Right    SHOULDER OPEN ROTATOR CUFF REPAIR Right    SPINAL FUSION  1996   t10 down to my coccyx   SPINE HARDWARE REMOVAL     TOTAL ABDOMINAL HYSTERECTOMY     TOTAL HIP ARTHROPLASTY Right 05/10/2014   [place]      by dr lonni olcott   TOTAL KNEE ARTHROPLASTY Left    TOTAL SHOULDER ARTHROPLASTY Left 08/19/2016   Procedure: TOTAL SHOULDER ARTHROPLASTY;  Surgeon: Eva Herring, MD;  Location: [facility] OR;  Service: Orthopedics;  Laterality: Left;  Left total shoulder replacement   Past Medical History:  Diagnosis Date   Anemia    Anxiety    Arthritis    Bipolar disorder (HCC)    Cardiomyopathy (HCC)    Complication of anesthesia    Awake but could not move   Depression    Diskitis 10/04/2023   GERD (gastroesophageal reflux disease)    Heart failure (HCC)    Acute   Heart murmur    related to VSD   High cholesterol    History  of blood transfusion    related to OR (08/19/2016)   History of hiatal hernia    Hypertension    Hyperthyroidism    Hypotension 09/06/2023   Mild cognitive impairment 09/06/2018   Paroxysmal ventricular tachycardia (HCC)    Pneumonia    Rectovaginal fistula 09/06/2023   Tachycardia, unspecified    Type II diabetes mellitus (HCC)    UTI (urinary tract infection)    being treated with Keflex    Ventricular septal defect    Ventriculitis of brain due to bacteria 11/02/2022   Vertebral fracture, osteoporotic (HCC) 01/06/2023   BP (!) 162/93 (BP Location: Right Arm, Patient Position: Sitting, Cuff Size: Normal)    Pulse 75   Ht 5' (1.524 m)   Wt 146 lb (66.2 kg)   SpO2 98%   BMI 28.51 kg/m   Opioid Risk Score:   Fall Risk Score:  `1  Depression screen [facility] 2/9     02/13/2024   11:27 AM 10/20/2023   12:20 PM 09/19/2023   10:18 AM 09/07/2023    9:09 AM 06/30/2023   11:55 AM 03/15/2023   10:54 AM 01/06/2023   10:36 AM  Depression screen PHQ 2/9  Decreased Interest 0 2 0 1 1 1  0  Down, Depressed, Hopeless 0 2 1 0 1 1 1   PHQ - 2 Score 0 4 1 1 2 2 1   Altered sleeping  2       Tired, decreased energy  2       Change in appetite  1       Feeling bad or failure about yourself   0       Trouble concentrating  0       Moving slowly or fidgety/restless  0       Suicidal thoughts  0       PHQ-9 Score  9          Review of Systems  Musculoskeletal:  Positive for back pain.       RT hip  All other systems reviewed and are negative.      Objective:   Physical Exam   Gen: no distress, normal appearing HEENT: oral mucosa pink and moist, NCAT Chest: normal effort, normal rate of breathing Abd: soft, non-distended Ext: no edema Psych: pleasant, normal affect Skin: Warm dry Neuro: Alert and awake, follows commands, cranial 2-12 grossly intact All 4 extremities 4-5/5, right hip flexion 4 - and knee extension 4 a little weaker than on the left side where she is hip flexion 4 out of 5 in knee extension 4+ out of 5 Sensation intact light touch in all 4 extremities Musculoskeletal: TTP lower T spine and  L spine, some bony changes palpable-chronic   CT lumbar spine 12-16-22 IMPRESSION: 1. Pronounced chronic osteopenia. Prior decompression and fusion throughout the lumbar spine.   2. L3 superior endplate/superior body non-acute fracture AND subtle associated horizontal fracture through the chronic posterior element bone mass there (series 9, image 53 today). But no displacement over this series of exams.   3. Difficult to exclude nondisplaced S3/S4 sacral fracture, with mild presacral  stranding there.   4. But no other acute osseous abnormality identified. And elsewhere the chronic T12 through sacral ankylosis/arthrodesis appears intact.   5. T10 compression fracture is detailed separately today on CT Thoracic Spine.   We discussed getting stay CT thoracic spine 12-16-2022 IMPRESSION: 1. T10 compression fracture with 12% loss of height is new since 08/19/2022. No retropulsion or other complicating  features. Unchanged adjacent chronic T10-T11 and T12-L1 posterior element ankylosis, with chronic non fusion of T11-T12. 2. No other acute osseous abnormality identified in the thoracic spine. Generalized osteopenia. Chronic mild T5, T6, and T11 compression fractures. 3. Increased elevation of the left hemidiaphragm and new left lung base consolidation since February. But small bilateral pleural effusions have resolved. 4. No CT evidence of thoracic spinal stenosis.   L spine MRI 10/07/23 IMPRESSION: 1. Postoperative changes of T11-S1 laminectomy and posterior spinal fusion with right posterior spinal instrumentation from L2-L5. On prior CT, there is evidence of fracture through the left posterior fusion mass at L4-5, correlating with edema is seen in the left pedicle of L4 on today's study. 2. Decreased edema within the L4 and L5 vertebral bodies and posterior elements, as well as within the L4-5 and L3-4 discs, likely reactive and related to the above fracture. 3. Loculated simple fluid within the thecal sac with some marginalized cauda equina nerve roots, most consistent with arachnoiditis. 4. No spinal canal stenosis or neural foraminal narrowing.       Assessment & Plan:   Osteomyelitis, infection of lumbar surgical site complicated by meningitis/ventriculitis.              -Continue to follow-up with NSGY as directed             -Continue to follow-up with infectious disease as directed  Chronic thoracic and lower back pain. Complex scoliosis   -History  of T11-S1 laminectomy and posterior spinal fusion, noted to have fracture to the left posterior fusion mass at L4-5, patient has had several compression fractures             -She is previously on buprenorphine , oxycodone -review of PDMP indicates she has not been prescribed this since May  -Patient seen by [facility] neurosurgery for second opinion  -Opiate risk tool moderate  - Patient is interested in Journavx -last creatinine was 0.56 and AST/ALT appear within normal limits  -Journavx  ordered and discount card provided.  Discussed risks and benefits of the medication.  Discussed that it is for intermittent use of pain exacerbations

## 2024-02-13 NOTE — Progress Notes (Signed)
 Tried calling the patient, no answer- left vm to call back. Sending Mychart message.

## 2024-02-13 NOTE — Telephone Encounter (Signed)
 Patient returning call from [address].

## 2024-02-13 NOTE — Telephone Encounter (Signed)
 Please inform patient that CT chest shows that her pulmonary nodule is stable in size and appearance.  This is good news. We can discuss the timing of repeat CT scan at next office visit. Will probably do in about 12 months.  CT Super D Chest Wo Contrast   I called the pt and there was no answer- LMTCB

## 2024-02-14 ENCOUNTER — Telehealth: Payer: Self-pay | Admitting: Physical Medicine & Rehabilitation

## 2024-02-14 NOTE — Telephone Encounter (Signed)
 Patient is calling Dr. Urbano about a new medication non narcotic Journavx .

## 2024-02-15 ENCOUNTER — Ambulatory Visit: Admitting: Physical Therapy

## 2024-02-15 ENCOUNTER — Telehealth: Payer: Self-pay | Admitting: Physical Medicine & Rehabilitation

## 2024-02-15 ENCOUNTER — Encounter: Payer: Self-pay | Admitting: Physical Therapy

## 2024-02-15 DIAGNOSIS — M6281 Muscle weakness (generalized): Secondary | ICD-10-CM | POA: Diagnosis not present

## 2024-02-15 DIAGNOSIS — R2681 Unsteadiness on feet: Secondary | ICD-10-CM

## 2024-02-15 DIAGNOSIS — R2689 Other abnormalities of gait and mobility: Secondary | ICD-10-CM

## 2024-02-15 NOTE — Telephone Encounter (Signed)
 Patient called back stating that the pharmacy does not have the medication and wold like to know if there is a PA needed or if someone could help her.

## 2024-02-15 NOTE — Telephone Encounter (Signed)
 Called and spoke with the patient regarding CT results. Pt is aware of results and verbalized understanding. Will follow up at [facility] 8/27. Nfn

## 2024-02-15 NOTE — Therapy (Signed)
 OUTPATIENT PHYSICAL THERAPY NEURO TREATMENT NOTE   Patient Name: [name] MRN: [id number] DOB:1956-03-02, 68 y.o., female Today's Date: 02/15/2024   PCP: Rolinda Millman, MD  REFERRING PROVIDER: Urbano Albright, MD   END OF SESSION:  PT End of Session - 02/15/24 1315     Visit Number 6    Number of Visits 17    Date for PT Re-Evaluation 03/23/24    Authorization Type BCBS Medicare    Progress Note Due on Visit 10    PT Start Time 1315    PT Stop Time 1355    PT Time Calculation (min) 40 min    Activity Tolerance Patient tolerated treatment well;Patient limited by pain    Behavior During Therapy WFL for tasks assessed/performed         Past Medical History:  Diagnosis Date   Anemia    Anxiety    Arthritis    Bipolar disorder (HCC)    Cardiomyopathy (HCC)    Complication of anesthesia    Awake but could not move   Depression    Diskitis 10/04/2023   GERD (gastroesophageal reflux disease)    Heart failure (HCC)    Acute   Heart murmur    related to VSD   High cholesterol    History of blood transfusion    related to OR (08/19/2016)   History of hiatal hernia    Hypertension    Hyperthyroidism    Hypotension 09/06/2023   Mild cognitive impairment 09/06/2018   Paroxysmal ventricular tachycardia (HCC)    Pneumonia    Rectovaginal fistula 09/06/2023   Tachycardia, unspecified    Type II diabetes mellitus (HCC)    UTI (urinary tract infection)    being treated with Keflex    Ventricular septal defect    Ventriculitis of brain due to bacteria 11/02/2022   Vertebral fracture, osteoporotic (HCC) 01/06/2023   Past Surgical History:  Procedure Laterality Date   ABDOMINAL HYSTERECTOMY     BACK SURGERY     CARDIAC CATHETERIZATION N/A 06/23/2015   Procedure: Left Heart Cath and Coronary Angiography;  Surgeon: Ezra GORMAN Shuck, MD;  Location: [facility] INVASIVE CV LAB;  Service: Cardiovascular;  Laterality: N/A;   CARDIAC CATHETERIZATION  1960   VSD was so  small; didn't need repaired   COLECTOMY, SIGMOID, ROBOT-ASSISTED N/A 11/30/2023   Procedure: COLECTOMY, SIGMOID, ROBOT-ASSISTED ROBOTIC LOW ANTERIOR RECTOSIGMOID RESECTION (LAR) WITH ANASTOMOSIS COLOVAGINAL FISTULA TAKEDOWN & REPAIR OMENTOPEXY OF VAGINA INTRAOPERATIVE ASSESSMENT OF TISSUE VASCULAR PERFUSION USING ICG (indocyanine green ) IMMUNOFLUORESCENCE TRANSVERSUS ABDOMINIS PLANE (TAP) BLOCK - BILATERAL;  Surgeon: Sheldon Standing, MD;  Location: [facility] ORS;  Service: General;  Late   CYSTOSCOPY WITH INDOCYANINE GREEN  IMAGING (ICG) N/A 11/30/2023   Procedure: Cystoscopy with bilateral ureteral FireFly injections Transurethral resection of bladder tumor--large;  Surgeon: Carolee Sherwood JONETTA DOUGLAS, MD;  Location: [facility];  Service: Urology;  Laterality: N/A;   EXAM UNDER ANESTHESIA WITH MANIPULATION OF HIP Right 06/02/2014   dr sheril   FLEXIBLE SIGMOIDOSCOPY N/A 11/30/2023   Procedure: KINGSTON SIDE;  Surgeon: Sheldon Standing, MD;  Location: [facility];  Service: General;  Laterality: N/A;   FRACTURE SURGERY     HERNIA REPAIR     HIP CLOSED REDUCTION Right 06/02/2014   Procedure: CLOSED MANIPULATION HIP;  Surgeon: Maude KANDICE sheril, MD;  Location: [facility] OR;  Service: Orthopedics;  Laterality: Right;   IR FLUORO GUIDED NEEDLE PLC ASPIRATION/INJECTION LOC  08/09/2023   JOINT REPLACEMENT     JOINT REPLACEMENT  POSTERIOR LUMBAR FUSION 4 LEVEL N/A 06/26/2022   Procedure: Lumbar One To Lumbar Five Posterior Instrumented Fusion;  Surgeon: Cheryle Debby LABOR, MD;  Location: [facility] OR;  Service: Neurosurgery;  Laterality: N/A;   REFRACTIVE SURGERY Bilateral    SHOULDER ARTHROSCOPY Right    SHOULDER OPEN ROTATOR CUFF REPAIR Right    SPINAL FUSION  1996   t10 down to my coccyx   SPINE HARDWARE REMOVAL     TOTAL ABDOMINAL HYSTERECTOMY     TOTAL HIP ARTHROPLASTY Right 05/10/2014   [place]      by dr lonni olcott   TOTAL KNEE ARTHROPLASTY Left    TOTAL SHOULDER ARTHROPLASTY Left 08/19/2016   Procedure:  TOTAL SHOULDER ARTHROPLASTY;  Surgeon: Eva Herring, MD;  Location: [facility] OR;  Service: Orthopedics;  Laterality: Left;  Left total shoulder replacement   Patient Active Problem List   Diagnosis Date Noted   Colovaginal fistula 11/30/2023   Diskitis 10/04/2023   Hypotension 09/06/2023   Fracture of tenth thoracic vertebra (HCC) 09/05/2023   History of discitis 09/05/2023   Senile osteoporosis 09/05/2023   History of urinary retention 09/05/2023   Vaginal atrophy 09/05/2023   Nocturia 09/05/2023   Vulvar ulcer 09/05/2023   Pulmonary nodule 08/02/2023   Non-traumatic compression fracture of vertebral column (HCC) 06/24/2023   Nonobstructive atherosclerosis of coronary artery 02/10/2023   Bilateral pseudophakia 02/03/2023   Vertebral fracture, osteoporotic (HCC) 01/06/2023   Ventriculitis of brain due to bacteria 11/02/2022   Anemia of chronic illness 10/12/2022   Bipolar disorder, in partial remission, most recent episode depressed (HCC) 10/07/2022   Adjustment disorder with mixed anxiety and depressed mood 09/30/2022   Chronic wound 09/25/2022   Hyperglycemia 09/25/2022   Chronic midline thoracic back pain 09/17/2022   Primary insomnia 09/17/2022   Chronic combined systolic and diastolic CHF (congestive heart failure) (HCC) 09/14/2022   Acute blood loss anemia 09/09/2022   Infection of lumbar spine (HCC) 09/03/2022   Malnutrition of moderate degree 08/20/2022   Hardware complicating wound infection (HCC) 08/20/2022   Infection of deep incisional surgical site after procedure 08/20/2022   Aspiration pneumonia of both lungs (HCC) 08/19/2022   Acute respiratory failure with hypoxia (HCC) 08/19/2022   Sepsis (HCC) 08/19/2022   Seizure (HCC) 08/19/2022   Wound dehiscence 08/09/2022   Delayed surgical wound healing 07/08/2022   Fracture of lumbar spine without cord injury (HCC) 06/26/2022   Lumbar vertebral fracture (HCC) 06/25/2022   Mood disorder (HCC) 09/29/2021   Generalized  muscle weakness 04/30/2019   Mild cognitive impairment 09/06/2018   Hyponatremia 02/28/2018   S/P shoulder replacement, left 08/19/2016   Anxiety 10/17/2014   Acid reflux 10/17/2014   BP (high blood pressure) 10/17/2014   Arthritis, degenerative 10/17/2014   Adult hypothyroidism 10/17/2014   UTI (urinary tract infection) 06/03/2014   Fracture of bone adjacent to prosthesis 06/03/2014   Peri-prosthetic fracture of femur following total hip arthroplasty 06/02/2014   Urinary retention 05/13/2014   Chronic pain 05/10/2014   History of hip surgery 05/10/2014   Osteoarthritis 09/30/2011   UNSPECIFIED HEART FAILURE 06/24/2010   UNSPECIFIED CONGENITAL DEFECT OF SEPTAL CLOSURE 06/24/2010   Secondary cardiomyopathy (HCC) 03/18/2010   Tobacco use 03/18/2010   DM 06/13/2009   HYPERTENSION, UNSPECIFIED 06/13/2009   VENTRICULAR TACHYCARDIA 06/13/2009   VENTRICULAR SEPTAL DEFECT, CONGENITAL 06/13/2009   Essential (primary) hypertension 06/13/2009   Diabetes mellitus type 2, noninsulin dependent (HCC) 06/13/2009    ONSET DATE: 01/05/2024 MD referral  REFERRING DIAG: R26.9 (ICD-10-CM) - Gait difficulty  THERAPY DIAG:  Muscle weakness (generalized)  Unsteadiness on feet  Other abnormalities of gait and mobility  Rationale for Evaluation and Treatment: Rehabilitation  SUBJECTIVE:                                                                                                                                                                                             SUBJECTIVE STATEMENT: Pt states she has been having some stomach issues. Went to doctor on Monday morning and had to cancel. Has been walking more except at the end of the day -- hips start to hurt. Back is doing okay.   Pt accompanied by: self  PERTINENT HISTORY: Chronic osteomyelitis with worsening back pain; Hx of multiple R hip fractures with surgery to revisions, significant leg length difference with LLE longer than RLE,  hx of infection post surgery in spine (treated on antibiotics) Per MD Note 01/09/2024:    Patient was seen by Dr. Elspeth Schultze who scheduled her for surgery and on  12/01/2023 she underwent   -ROBOTIC LOW ANTERIOR RECTOSIGMOID RESECTION (LAR) WITH ANASTOMOSIS -COLOVAGINAL FISTULA TAKEDOWN & REPAIR -OMENTOPEXY OF VAGINA -INTRAOPERATIVE ASSESSMENT OF TISSUE VASCULAR PERFUSION USING ICG (indocyanine green ) IMMUNOFLUORESCENCE -TRANSVERSUS ABDOMINIS PLANE (TAP) BLOCK - BILATERAL -FLEXIBLE SIGMOIDOSCOPY    Urology did joint surgery and did    1.  Cystoscopy with bilateral ureteral FireFly injections 2.  Transurethral resection of  possible bladder tumor--large   Pathology came back with:   A. BLADDER TUMOR, TRANSURETHERAL RESECTON:  Florid cystitis cystica and papillary cystitis  Negative for carcinoma   B. RECTOSIGMOID COLON RESECTION:  Diverticula with chronic inflammation, perforation, fistula and reactive  fibrosis  Negative for carcinoma  Five benign lymph nodes (0/5)   C. FINAL DISTAL MARGIN:  Benign colonic tissue          PAIN:  Are you having pain? Yes: NPRS scale: 4/10 Pain location: R hip/R ribs Pain description: throbbing, grinding, sharp Aggravating factors: more activities, [place] Relieving factors: rest  PRECAUTIONS: Fall and Other: Recent colectomy-pt reports no restrictions Pt has history of osteoporosis and compression fractures RED FLAGS: None   WEIGHT BEARING RESTRICTIONS: No  FALLS: Has patient fallen in last 6 months? No  LIVING ENVIRONMENT: Lives with: lives with their family Lives in: House/apartment Stairs: long ramp in/out of home Has following equipment at home: Vannie - 2 wheeled and Wheelchair (manual)  PLOF: Needs assistance with gait and Vocation/Vocational requirements: retired Runner, broadcasting/film/video, Tree surgeon; enjoys spending time with grandchildren in [place] and goes to [employer] weekly  PATIENT GOALS: To get stronger, to  get back on a cane.  To use walker to get to the car (long ramp), get RLE into car better  OBJECTIVE:     TODAY'S TREATMENT: 02/15/24 Activity Comments  Seated Pball forward flexion x10 Pball ab set 10x5 Hip flexion iso into Pball on pt's lap 10x5 Shoulder horizontal abd red TB x10, x6 to fatigue   Standing Row red TB 2x10 Shoulder ext red TB 2x10 With chair support  Gait x60' with RW SBA x2 turns Cues to keep feet in RW during turns  Step heel tap Fwd/toe tap bwd R&L to fatigue 1 hand hold for assist Cues to keep from locking out knees; able to perform x10 with L foot stepping, only 8 with R foot stepping            Access Code: [email or web address] URL: [email or web address] Date: 02/02/2024 Prepared by: [facility] - Outpatient  Rehab - [facility]  Program Notes Seated leg slides out and in;  have a pillow case under your right foot-slide your foot out and in, 3 x 10 reps.  You can have a 3-5# ankle weight on your ankle  Exercises - Seated Hip Adduction Isometrics with Ball  - 1 x daily - 7 x weekly - 3 sets - 10 reps - Seated Hip Abduction with Resistance  - 1 x daily - 7 x weekly - 3 sets - 10 reps - Sit to Stand with Resistance Around Legs  - 1 x daily - 7 x weekly - 3 sets - 5 reps - Seated Shoulder Row with Anchored Resistance  - 1 x daily - 5 x weekly - 2 sets - 10 reps     PATIENT EDUCATION: Education details: Reprinted exercises in HEP, as pt reports she lost her copy; explained rationale for seated/standing exercises to help with postural strengthening and discussed difference between muscle soreness versus joint/bony soreness with exercise Person educated: Patient Education method: Explanation, Demonstration, Tactile cues, Verbal cues, and Handouts Education comprehension: verbalized understanding and returned demonstration    --------------------------------------------------- Note: Objective measures were completed at Evaluation unless  otherwise noted.  DIAGNOSTIC FINDINGS: MRI 09/2023   IMPRESSION: 1. Postoperative changes of T11-S1 laminectomy and posterior spinal fusion with right posterior spinal instrumentation from L2-L5. On prior CT, there is evidence of fracture through the left posterior fusion mass at L4-5, correlating with edema is seen in the left pedicle of L4 on today's study. 2. Decreased edema within the L4 and L5 vertebral bodies and posterior elements, as well as within the L4-5 and L3-4 discs, likely reactive and related to the above fracture. 3. Loculated simple fluid within the thecal sac with some marginalized cauda equina nerve roots, most consistent with arachnoiditis. 4. No spinal canal stenosis or neural foraminal narrowing.  COGNITION: Overall cognitive status: History of cognitive impairments - at baseline   SENSATION: Light touch: Impaired  and to light touch-see below Reports some numbness L foot  POSTURE: Leg length diffierence  LOWER EXTREMITY ROM:     Active  Right Eval Left Eval  Hip flexion    Hip extension    Hip abduction    Hip adduction    Hip internal rotation    Hip external rotation    Knee flexion    Knee extension    Ankle dorsiflexion    Ankle plantarflexion    Ankle inversion    Ankle eversion     (Blank rows = not tested)  LOWER EXTREMITY MMT:    MMT Right Eval Left Eval  Hip flexion 3- 3+  Hip extension    Hip abduction 4 4  Hip adduction 4 4  Hip internal rotation    Hip external rotation    Knee flexion 4 4  Knee extension 3+ 4  Ankle dorsiflexion 3+ 3+  Ankle plantarflexion    Ankle inversion    Ankle eversion    (Blank rows = not tested)   TRANSFERS: Sit to stand: SBA  Assistive device utilized: Environmental consultant - 2 wheeled     Stand to sit: SBA  Assistive device utilized: Environmental consultant - 2 wheeled      GAIT: Findings: Gait Characteristics: excess lateral weightshift through trunk due to significant leg length difference and step through  pattern, Distance walked: 50 ft, Assistive device utilized:Walker - 2 wheeled, Level of assistance: CGA, and Comments: NA  FUNCTIONAL TESTS:  5 times sit to stand: 14.44 sec with BUE support Timed up and go (TUG): 24.53 sec 2 minute walk test: NT 10 meter walk test: 21.34 sec (1.54 ft/sec) TUG cognitive: (naming colors) 28.62 sec 55M walk back:  42.16 sec with RW Stroop gait:  13.25 sec in 10 ft (naming color words)-0.75 ft/sec   18.75 sec in 10 feet (naming printed colors)-0.53 ft/sec                                                                                                                               TREATMENT DATE: 01/26/2024    PATIENT EDUCATION: Education details: Eval results, POC Person educated: Patient Education method: Explanation Education comprehension: verbalized understanding  HOME EXERCISE PROGRAM: Not yet initiated Did perform eccentric hip flexion/extension in sitting  GOALS: Goals reviewed with patient? Yes  SHORT TERM GOALS: Target date: 02/24/2024  Pt will be independent with HEP for improved strength, balance, gait. Baseline: Goal status: IN PROGRESS  2.  Pt will improve TUG score to less than or equal to 18 sec for decreased fall risk. Baseline: 24.53 sec Goal status: IN PROGRESS  3.  Pt will improve 3 M Walk backwards to less than or equal to 30 seconds for decreased fall risk. Baseline: 42.16 sec Goal status: IN PROGRESS  4.  Pt will report at least 50% improvement in car transfers.  Baseline:  difficulty with RLE  Goal status:  IN PROGRESS  LONG TERM GOALS: Target date: 03/23/2024  Pt will be independent with HEP for improved strength, balance, gait. Baseline:  Goal status: IN PROGRESS  2.  Pt will improve gait velocity to at least 1.8 ft/sec for improved gait efficiency and safety. Baseline: 1.54 ft/sec Goal status: IN PROGRESS  3.  Pt will improve gait velocity with dual task to at least 1 ft/sec for improved gait and decreased fall  risk. Baseline: 0.53-0.75 ft/sec with Stroop over 10 ft Goal status: IN PROGRESS  4.  Pt will improve 5x sit<>stand to less than or equal to 11.5 sec with light UE support to demonstrate improved functional strength and transfer efficiency. Baseline: 14.44 sec Goal  status: IN PROGRESS  5.  Pt will verbalize plans for continued community fitness upon d/c from PT to maximize gains made in PT. Baseline:  Goal status: IN PROGRESS  6.  Pt will ambulate at least 200 ft in 2 minute walk, for improved gait efficiency and endurance.  Baseline: 100 ft in 1:30  Goal status:  INITIAL, 01/30/2024   ASSESSMENT:  CLINICAL IMPRESSION: Treatment session focused on initiating core and midback/trunk extensor strengthening. Pt fatigues and requires seated breaks. Worked on improving tolerance to single limb weight bearing/weight shifting -- this was very fatiguing for pt with only one UE support.   OBJECTIVE IMPAIRMENTS: Abnormal gait, decreased balance, decreased mobility, difficulty walking, decreased strength, impaired sensation, postural dysfunction, and pain.   ACTIVITY LIMITATIONS: sitting, standing, transfers, locomotion level, and caring for others  PARTICIPATION LIMITATIONS: driving, shopping, community activity, and travel  PERSONAL FACTORS: 3+ comorbidities: see PMH above are also affecting patient's functional outcome.   REHAB POTENTIAL: Good  CLINICAL DECISION MAKING: Evolving/moderate complexity  EVALUATION COMPLEXITY: Moderate  PLAN:  PT FREQUENCY: 2x/week  PT DURATION: 8 weeks plus eval visit  PLANNED INTERVENTIONS: 97750- Physical Performance Testing, 97110-Therapeutic exercises, 97530- Therapeutic activity, 97112- Neuromuscular re-education, 97535- Self Care, [id number]- Manual therapy, [phone]- Gait training, Patient/Family education, and Balance training  PLAN FOR NEXT SESSION: Continue lower extremity strengthening (encourage pt to be using weights/bands at home as able, but  unsure as she thinks her weights are 6#, that this may be too much).  Ask about back pain and progress standing and gait tolerance; RLE strength for car transfers; pt requests not to perform Nustep d/t hip pain; pt reports that she would like to work towards being able to walk down her ramp and into her car (reports currently limited by endurance and balance)   Arrionna Serena April Ma L Alvie Speltz, PT, DPT 02/15/24 1:15 PM Phone: [phone] Fax: [phone]  [facility] at [facility] [address] [place], [place] [address] Phone # [phone] Fax # [phone]

## 2024-02-15 NOTE — Telephone Encounter (Signed)
 Please see previous encounter. I have spoken with the patient.

## 2024-02-16 MED ORDER — SUZETRIGINE 50 MG PO TABS: 50.0000 mg | ORAL_TABLET | Freq: Two times a day (BID) | ORAL | 1 refills | Status: DC | PRN

## 2024-02-16 NOTE — Telephone Encounter (Signed)
 Please transmit the Rx Journavx  to [facility]. It does not look as if it was received by the pharmacy. Per patient it was not sent.   Thank you.

## 2024-02-21 ENCOUNTER — Ambulatory Visit: Attending: Physical Medicine & Rehabilitation

## 2024-02-21 DIAGNOSIS — R2681 Unsteadiness on feet: Secondary | ICD-10-CM | POA: Diagnosis present

## 2024-02-21 DIAGNOSIS — R2689 Other abnormalities of gait and mobility: Secondary | ICD-10-CM | POA: Diagnosis present

## 2024-02-21 DIAGNOSIS — M6281 Muscle weakness (generalized): Secondary | ICD-10-CM | POA: Insufficient documentation

## 2024-02-21 NOTE — Therapy (Signed)
 OUTPATIENT PHYSICAL THERAPY NEURO TREATMENT NOTE   Patient Name: [name] MRN: [id number] DOB:Feb 08, 1956, 68 y.o., female Today's Date: 02/21/2024   PCP: Rolinda Millman, MD  REFERRING PROVIDER: Urbano Albright, MD   END OF SESSION:  PT End of Session - 02/21/24 1443     Visit Number 7    Number of Visits 17    Date for PT Re-Evaluation 03/23/24    Authorization Type BCBS Medicare    Progress Note Due on Visit 10    PT Start Time 1445    PT Stop Time 1530    PT Time Calculation (min) 45 min    Activity Tolerance Patient tolerated treatment well;Patient limited by pain    Behavior During Therapy WFL for tasks assessed/performed         Past Medical History:  Diagnosis Date   Anemia    Anxiety    Arthritis    Bipolar disorder (HCC)    Cardiomyopathy (HCC)    Complication of anesthesia    Awake but could not move   Depression    Diskitis 10/04/2023   GERD (gastroesophageal reflux disease)    Heart failure (HCC)    Acute   Heart murmur    related to VSD   High cholesterol    History of blood transfusion    related to OR (08/19/2016)   History of hiatal hernia    Hypertension    Hyperthyroidism    Hypotension 09/06/2023   Mild cognitive impairment 09/06/2018   Paroxysmal ventricular tachycardia (HCC)    Pneumonia    Rectovaginal fistula 09/06/2023   Tachycardia, unspecified    Type II diabetes mellitus (HCC)    UTI (urinary tract infection)    being treated with Keflex    Ventricular septal defect    Ventriculitis of brain due to bacteria 11/02/2022   Vertebral fracture, osteoporotic (HCC) 01/06/2023   Past Surgical History:  Procedure Laterality Date   ABDOMINAL HYSTERECTOMY     BACK SURGERY     CARDIAC CATHETERIZATION N/A 06/23/2015   Procedure: Left Heart Cath and Coronary Angiography;  Surgeon: Ezra GORMAN Shuck, MD;  Location: [facility] INVASIVE CV [facility];  Service: Cardiovascular;  Laterality: N/A;   CARDIAC CATHETERIZATION  1960   VSD was so  small; didn't need repaired   COLECTOMY, SIGMOID, ROBOT-ASSISTED N/A 11/30/2023   Procedure: COLECTOMY, SIGMOID, ROBOT-ASSISTED ROBOTIC LOW ANTERIOR RECTOSIGMOID RESECTION (LAR) WITH ANASTOMOSIS COLOVAGINAL FISTULA TAKEDOWN & REPAIR OMENTOPEXY OF VAGINA INTRAOPERATIVE ASSESSMENT OF TISSUE VASCULAR PERFUSION USING ICG (indocyanine green ) IMMUNOFLUORESCENCE TRANSVERSUS ABDOMINIS PLANE (TAP) BLOCK - BILATERAL;  Surgeon: Sheldon Standing, MD;  Location: [facility];  Service: General;  Late   CYSTOSCOPY WITH INDOCYANINE GREEN  IMAGING (ICG) N/A 11/30/2023   Procedure: Cystoscopy with bilateral ureteral FireFly injections Transurethral resection of bladder tumor--large;  Surgeon: Carolee Sherwood JONETTA DOUGLAS, MD;  Location: [facility];  Service: Urology;  Laterality: N/A;   EXAM UNDER ANESTHESIA WITH MANIPULATION OF HIP Right 06/02/2014   dr sheril   FLEXIBLE SIGMOIDOSCOPY N/A 11/30/2023   Procedure: KINGSTON SIDE;  Surgeon: Sheldon Standing, MD;  Location: [facility];  Service: General;  Laterality: N/A;   FRACTURE SURGERY     HERNIA REPAIR     HIP CLOSED REDUCTION Right 06/02/2014   Procedure: CLOSED MANIPULATION HIP;  Surgeon: Maude KANDICE sheril, MD;  Location: [facility];  Service: Orthopedics;  Laterality: Right;   IR FLUORO GUIDED NEEDLE PLC ASPIRATION/INJECTION LOC  08/09/2023   JOINT REPLACEMENT     JOINT REPLACEMENT  POSTERIOR LUMBAR FUSION 4 LEVEL N/A 06/26/2022   Procedure: Lumbar One To Lumbar Five Posterior Instrumented Fusion;  Surgeon: Cheryle Debby LABOR, MD;  Location: [facility] OR;  Service: Neurosurgery;  Laterality: N/A;   REFRACTIVE SURGERY Bilateral    SHOULDER ARTHROSCOPY Right    SHOULDER OPEN ROTATOR CUFF REPAIR Right    SPINAL FUSION  1996   t10 down to my coccyx   SPINE HARDWARE REMOVAL     TOTAL ABDOMINAL HYSTERECTOMY     TOTAL HIP ARTHROPLASTY Right 05/10/2014   [place]      by dr lonni olcott   TOTAL KNEE ARTHROPLASTY Left    TOTAL SHOULDER ARTHROPLASTY Left 08/19/2016   Procedure:  TOTAL SHOULDER ARTHROPLASTY;  Surgeon: Eva Herring, MD;  Location: [facility] OR;  Service: Orthopedics;  Laterality: Left;  Left total shoulder replacement   Patient Active Problem List   Diagnosis Date Noted   Colovaginal fistula 11/30/2023   Diskitis 10/04/2023   Hypotension 09/06/2023   Fracture of tenth thoracic vertebra (HCC) 09/05/2023   History of discitis 09/05/2023   Senile osteoporosis 09/05/2023   History of urinary retention 09/05/2023   Vaginal atrophy 09/05/2023   Nocturia 09/05/2023   Vulvar ulcer 09/05/2023   Pulmonary nodule 08/02/2023   Non-traumatic compression fracture of vertebral column (HCC) 06/24/2023   Nonobstructive atherosclerosis of coronary artery 02/10/2023   Bilateral pseudophakia 02/03/2023   Vertebral fracture, osteoporotic (HCC) 01/06/2023   Ventriculitis of brain due to bacteria 11/02/2022   Anemia of chronic illness 10/12/2022   Bipolar disorder, in partial remission, most recent episode depressed (HCC) 10/07/2022   Adjustment disorder with mixed anxiety and depressed mood 09/30/2022   Chronic wound 09/25/2022   Hyperglycemia 09/25/2022   Chronic midline thoracic back pain 09/17/2022   Primary insomnia 09/17/2022   Chronic combined systolic and diastolic CHF (congestive heart failure) (HCC) 09/14/2022   Acute blood loss anemia 09/09/2022   Infection of lumbar spine (HCC) 09/03/2022   Malnutrition of moderate degree 08/20/2022   Hardware complicating wound infection (HCC) 08/20/2022   Infection of deep incisional surgical site after procedure 08/20/2022   Aspiration pneumonia of both lungs (HCC) 08/19/2022   Acute respiratory failure with hypoxia (HCC) 08/19/2022   Sepsis (HCC) 08/19/2022   Seizure (HCC) 08/19/2022   Wound dehiscence 08/09/2022   Delayed surgical wound healing 07/08/2022   Fracture of lumbar spine without cord injury (HCC) 06/26/2022   Lumbar vertebral fracture (HCC) 06/25/2022   Mood disorder (HCC) 09/29/2021   Generalized  muscle weakness 04/30/2019   Mild cognitive impairment 09/06/2018   Hyponatremia 02/28/2018   S/P shoulder replacement, left 08/19/2016   Anxiety 10/17/2014   Acid reflux 10/17/2014   BP (high blood pressure) 10/17/2014   Arthritis, degenerative 10/17/2014   Adult hypothyroidism 10/17/2014   UTI (urinary tract infection) 06/03/2014   Fracture of bone adjacent to prosthesis 06/03/2014   Peri-prosthetic fracture of femur following total hip arthroplasty 06/02/2014   Urinary retention 05/13/2014   Chronic pain 05/10/2014   History of hip surgery 05/10/2014   Osteoarthritis 09/30/2011   UNSPECIFIED HEART FAILURE 06/24/2010   UNSPECIFIED CONGENITAL DEFECT OF SEPTAL CLOSURE 06/24/2010   Secondary cardiomyopathy (HCC) 03/18/2010   Tobacco use 03/18/2010   DM 06/13/2009   HYPERTENSION, UNSPECIFIED 06/13/2009   VENTRICULAR TACHYCARDIA 06/13/2009   VENTRICULAR SEPTAL DEFECT, CONGENITAL 06/13/2009   Essential (primary) hypertension 06/13/2009   Diabetes mellitus type 2, noninsulin dependent (HCC) 06/13/2009    ONSET DATE: 01/05/2024 MD referral  REFERRING DIAG: R26.9 (ICD-10-CM) - Gait difficulty  THERAPY DIAG:  Muscle weakness (generalized)  Unsteadiness on feet  Other abnormalities of gait and mobility  Rationale for Evaluation and Treatment: Rehabilitation  SUBJECTIVE:                                                                                                                                                                                             SUBJECTIVE STATEMENT: The standing w/ leg length correction causes severe and lasting back pain.  The forward flexing activities with the physioball also caused a lot of discomfort.   Pt accompanied by: self  PERTINENT HISTORY: Chronic osteomyelitis with worsening back pain; Hx of multiple R hip fractures with surgery to revisions, significant leg length difference with LLE longer than RLE, hx of infection post surgery in spine  (treated on antibiotics) Per MD Note 01/09/2024:    Patient was seen by Dr. Elspeth Schultze who scheduled her for surgery and on  12/01/2023 she underwent   -ROBOTIC LOW ANTERIOR RECTOSIGMOID RESECTION (LAR) WITH ANASTOMOSIS -COLOVAGINAL FISTULA TAKEDOWN & REPAIR -OMENTOPEXY OF VAGINA -INTRAOPERATIVE ASSESSMENT OF TISSUE VASCULAR PERFUSION USING ICG (indocyanine green ) IMMUNOFLUORESCENCE -TRANSVERSUS ABDOMINIS PLANE (TAP) BLOCK - BILATERAL -FLEXIBLE SIGMOIDOSCOPY    Urology did joint surgery and did    1.  Cystoscopy with bilateral ureteral FireFly injections 2.  Transurethral resection of  possible bladder tumor--large   Pathology came back with:   A. BLADDER TUMOR, TRANSURETHERAL RESECTON:  Florid cystitis cystica and papillary cystitis  Negative for carcinoma   B. RECTOSIGMOID COLON RESECTION:  Diverticula with chronic inflammation, perforation, fistula and reactive  fibrosis  Negative for carcinoma  Five benign lymph nodes (0/5)   C. FINAL DISTAL MARGIN:  Benign colonic tissue          PAIN:  Are you having pain? Yes: NPRS scale: 4/10 Pain location: R hip/back Pain description: throbbing, grinding, sharp Aggravating factors: more activities, [place] Relieving factors: rest  PRECAUTIONS: Fall and Other: Recent colectomy-pt reports no restrictions Pt has history of osteoporosis and compression fractures RED FLAGS: None   WEIGHT BEARING RESTRICTIONS: No  FALLS: Has patient fallen in last 6 months? No  LIVING ENVIRONMENT: Lives with: lives with their family Lives in: House/apartment Stairs: long ramp in/out of home Has following equipment at home: Vannie - 2 wheeled and Wheelchair (manual)  PLOF: Needs assistance with gait and Vocation/Vocational requirements: retired Runner, broadcasting/film/video, Tree surgeon; enjoys spending time with grandchildren in [place] and goes to [employer] weekly  PATIENT GOALS: To get stronger, to get back on a cane.  To use walker to get  to the car (long ramp), get RLE into  car better  OBJECTIVE:   TODAY'S TREATMENT: 02/21/24 Activity Comments  Seated anti-rotation (paloff press) 1x10 w/ red   Seated postural perturbations On dynadisc 3x30 sec On rocker board 2x30 sec  Supine LE PRE -hip add iso 30x -SAQ 3x10, 3# -heel slides 3x10 manual resistance               TODAY'S TREATMENT: 02/15/24 Activity Comments  Seated Pball forward flexion x10 Pball ab set 10x5 Hip flexion iso into Pball on pt's lap 10x5 Shoulder horizontal abd red TB x10, x6 to fatigue   Standing Row red TB 2x10 Shoulder ext red TB 2x10 With chair support  Gait x60' with RW SBA x2 turns Cues to keep feet in RW during turns  Step heel tap Fwd/toe tap bwd R&L to fatigue 1 hand hold for assist Cues to keep from locking out knees; able to perform x10 with L foot stepping, only 8 with R foot stepping            Access Code: [email or web address] URL: [email or web address] Date: 02/02/2024 Prepared by: [facility] Outpatient  Rehab - [facility]  Program Notes Seated leg slides out and in;  have a pillow case under your right foot-slide your foot out and in, 3 x 10 reps.  You can have a 3-5# ankle weight on your ankle  Exercises - Seated Hip Adduction Isometrics with Ball  - 1 x daily - 7 x weekly - 3 sets - 10 reps - Seated Hip Abduction with Resistance  - 1 x daily - 7 x weekly - 3 sets - 10 reps - Sit to Stand with Resistance Around Legs  - 1 x daily - 7 x weekly - 3 sets - 5 reps - Seated Shoulder Row with Anchored Resistance  - 1 x daily - 5 x weekly - 2 sets - 10 reps - Seated Anti-Rotation Press With Anchored Resistance  - 1 x daily - 7 x weekly - 3 sets - 10 reps     PATIENT EDUCATION: Education details: Reprinted exercises in HEP, as pt reports she lost her copy; explained rationale for seated/standing exercises to help with postural strengthening and discussed difference between muscle soreness versus joint/bony soreness  with exercise Person educated: Patient Education method: Explanation, Demonstration, Tactile cues, Verbal cues, and Handouts Education comprehension: verbalized understanding and returned demonstration    --------------------------------------------------- Note: Objective measures were completed at Evaluation unless otherwise noted.  DIAGNOSTIC FINDINGS: MRI 09/2023   IMPRESSION: 1. Postoperative changes of T11-S1 laminectomy and posterior spinal fusion with right posterior spinal instrumentation from L2-L5. On prior CT, there is evidence of fracture through the left posterior fusion mass at L4-5, correlating with edema is seen in the left pedicle of L4 on today's study. 2. Decreased edema within the L4 and L5 vertebral bodies and posterior elements, as well as within the L4-5 and L3-4 discs, likely reactive and related to the above fracture. 3. Loculated simple fluid within the thecal sac with some marginalized cauda equina nerve roots, most consistent with arachnoiditis. 4. No spinal canal stenosis or neural foraminal narrowing.  COGNITION: Overall cognitive status: History of cognitive impairments - at baseline   SENSATION: Light touch: Impaired  and to light touch-see below Reports some numbness L foot  POSTURE: Leg length diffierence  LOWER EXTREMITY ROM:     Active  Right Eval Left Eval  Hip flexion    Hip extension    Hip abduction    Hip adduction    Hip  internal rotation    Hip external rotation    Knee flexion    Knee extension    Ankle dorsiflexion    Ankle plantarflexion    Ankle inversion    Ankle eversion     (Blank rows = not tested)  LOWER EXTREMITY MMT:    MMT Right Eval Left Eval  Hip flexion 3- 3+  Hip extension    Hip abduction 4 4  Hip adduction 4 4  Hip internal rotation    Hip external rotation    Knee flexion 4 4  Knee extension 3+ 4  Ankle dorsiflexion 3+ 3+  Ankle plantarflexion    Ankle inversion    Ankle eversion     (Blank rows = not tested)   TRANSFERS: Sit to stand: SBA  Assistive device utilized: Environmental consultant - 2 wheeled     Stand to sit: SBA  Assistive device utilized: Environmental consultant - 2 wheeled      GAIT: Findings: Gait Characteristics: excess lateral weightshift through trunk due to significant leg length difference and step through pattern, Distance walked: 50 ft, Assistive device utilized:Walker - 2 wheeled, Level of assistance: CGA, and Comments: NA  FUNCTIONAL TESTS:  5 times sit to stand: 14.44 sec with BUE support Timed up and go (TUG): 24.53 sec 2 minute walk test: NT 10 meter walk test: 21.34 sec (1.54 ft/sec) TUG cognitive: (naming colors) 28.62 sec 42M walk back:  42.16 sec with RW Stroop gait:  13.25 sec in 10 ft (naming color words)-0.75 ft/sec   18.75 sec in 10 feet (naming printed colors)-0.53 ft/sec                                                                                                                               TREATMENT DATE: 01/26/2024    PATIENT EDUCATION: Education details: Eval results, POC Person educated: Patient Education method: Explanation Education comprehension: verbalized understanding  HOME EXERCISE PROGRAM: Not yet initiated Did perform eccentric hip flexion/extension in sitting  GOALS: Goals reviewed with patient? Yes  SHORT TERM GOALS: Target date: 02/24/2024  Pt will be independent with HEP for improved strength, balance, gait. Baseline: Goal status: IN PROGRESS  2.  Pt will improve TUG score to less than or equal to 18 sec for decreased fall risk. Baseline: 24.53 sec Goal status: IN PROGRESS  3.  Pt will improve 3 M Walk backwards to less than or equal to 30 seconds for decreased fall risk. Baseline: 42.16 sec Goal status: IN PROGRESS  4.  Pt will report at least 50% improvement in car transfers.  Baseline:  difficulty with RLE  Goal status:  IN PROGRESS  LONG TERM GOALS: Target date: 03/23/2024  Pt will be independent with HEP for  improved strength, balance, gait. Baseline:  Goal status: IN PROGRESS  2.  Pt will improve gait velocity to at least 1.8 ft/sec for improved gait efficiency and safety. Baseline: 1.54 ft/sec Goal status: IN PROGRESS  3.  Pt will improve gait velocity with dual task to at least 1 ft/sec for improved gait and decreased fall risk. Baseline: 0.53-0.75 ft/sec with Stroop over 10 ft Goal status: IN PROGRESS  4.  Pt will improve 5x sit<>stand to less than or equal to 11.5 sec with light UE support to demonstrate improved functional strength and transfer efficiency. Baseline: 14.44 sec Goal status: IN PROGRESS  5.  Pt will verbalize plans for continued community fitness upon d/c from PT to maximize gains made in PT. Baseline:  Goal status: IN PROGRESS  6.  Pt will ambulate at least 200 ft in 2 minute walk, for improved gait efficiency and endurance.  Baseline: 100 ft in 1:30  Goal status:  INITIAL, 01/30/2024   ASSESSMENT:  CLINICAL IMPRESSION: Instructed in static trunk strength activities to improve trunk strength/stability and adapted to avoid bending/twisting as pt reports poor response to these movements in past 2 sessions.  Tolerated static postural stabilization demands well without increase to back pain. LE PRE to improve strength and activity tolerance for increased ambulation endurance. Continued sessions to progress activities to improve ambulation.  Pt reports ongoing back pain and discomfort with difficulty in positioning in w/c due to fixed pelvic obliquity and leg length discrepancy.  Demonstrated various w/c cushions to accommodate these deformities for improved comfort and pressure relief.   OBJECTIVE IMPAIRMENTS: Abnormal gait, decreased balance, decreased mobility, difficulty walking, decreased strength, impaired sensation, postural dysfunction, and pain.   ACTIVITY LIMITATIONS: sitting, standing, transfers, locomotion level, and caring for others  PARTICIPATION  LIMITATIONS: driving, shopping, community activity, and travel  PERSONAL FACTORS: 3+ comorbidities: see PMH above are also affecting patient's functional outcome.   REHAB POTENTIAL: Good  CLINICAL DECISION MAKING: Evolving/moderate complexity  EVALUATION COMPLEXITY: Moderate  PLAN:  PT FREQUENCY: 2x/week  PT DURATION: 8 weeks plus eval visit  PLANNED INTERVENTIONS: 97750- Physical Performance Testing, 97110-Therapeutic exercises, 97530- Therapeutic activity, 97112- Neuromuscular re-education, 97535- Self Care, [id number]- Manual therapy, [phone]- Gait training, Patient/Family education, and Balance training  PLAN FOR NEXT SESSION: Continue lower extremity strengthening (encourage pt to be using weights/bands at home as able, but unsure as she thinks her weights are 6#, that this may be too much).  Ask about back pain and progress standing and gait tolerance; RLE strength for car transfers; pt requests not to perform Nustep d/t hip pain; pt reports that she would like to work towards being able to walk down her ramp and into her car (reports currently limited by endurance and balance)   Jonette MARLA Sandifer, PT, DPT 02/21/24 2:44 PM Phone: [phone] Fax: [phone]  [facility] at [facility] [address] [place], [place] [address] Phone # [phone] Fax # [phone]

## 2024-02-22 NOTE — Therapy (Signed)
 OUTPATIENT PHYSICAL THERAPY NEURO TREATMENT NOTE   Patient Name: [name] MRN: [id number] DOB:May 07, 1956, 68 y.o., female Today's Date: 02/22/2024   PCP: Rolinda Millman, MD  REFERRING PROVIDER: Urbano Albright, MD   END OF SESSION:   Past Medical History:  Diagnosis Date   Anemia    Anxiety    Arthritis    Bipolar disorder (HCC)    Cardiomyopathy (HCC)    Complication of anesthesia    Awake but could not move   Depression    Diskitis 10/04/2023   GERD (gastroesophageal reflux disease)    Heart failure (HCC)    Acute   Heart murmur    related to VSD   High cholesterol    History of blood transfusion    related to OR (08/19/2016)   History of hiatal hernia    Hypertension    Hyperthyroidism    Hypotension 09/06/2023   Mild cognitive impairment 09/06/2018   Paroxysmal ventricular tachycardia (HCC)    Pneumonia    Rectovaginal fistula 09/06/2023   Tachycardia, unspecified    Type II diabetes mellitus (HCC)    UTI (urinary tract infection)    being treated with Keflex    Ventricular septal defect    Ventriculitis of brain due to bacteria 11/02/2022   Vertebral fracture, osteoporotic (HCC) 01/06/2023   Past Surgical History:  Procedure Laterality Date   ABDOMINAL HYSTERECTOMY     BACK SURGERY     CARDIAC CATHETERIZATION N/A 06/23/2015   Procedure: Left Heart Cath and Coronary Angiography;  Surgeon: Ezra GORMAN Shuck, MD;  Location: [facility] INVASIVE CV LAB;  Service: Cardiovascular;  Laterality: N/A;   CARDIAC CATHETERIZATION  1960   VSD was so small; didn't need repaired   COLECTOMY, SIGMOID, ROBOT-ASSISTED N/A 11/30/2023   Procedure: COLECTOMY, SIGMOID, ROBOT-ASSISTED ROBOTIC LOW ANTERIOR RECTOSIGMOID RESECTION (LAR) WITH ANASTOMOSIS COLOVAGINAL FISTULA TAKEDOWN & REPAIR OMENTOPEXY OF VAGINA INTRAOPERATIVE ASSESSMENT OF TISSUE VASCULAR PERFUSION USING ICG (indocyanine green ) IMMUNOFLUORESCENCE TRANSVERSUS ABDOMINIS PLANE (TAP) BLOCK - BILATERAL;  Surgeon:  Sheldon Standing, MD;  Location: [facility] ORS;  Service: General;  Late   CYSTOSCOPY WITH INDOCYANINE GREEN  IMAGING (ICG) N/A 11/30/2023   Procedure: Cystoscopy with bilateral ureteral FireFly injections Transurethral resection of bladder tumor--large;  Surgeon: Carolee Sherwood JONETTA DOUGLAS, MD;  Location: [facility];  Service: Urology;  Laterality: N/A;   EXAM UNDER ANESTHESIA WITH MANIPULATION OF HIP Right 06/02/2014   dr sheril   FLEXIBLE SIGMOIDOSCOPY N/A 11/30/2023   Procedure: KINGSTON SIDE;  Surgeon: Sheldon Standing, MD;  Location: [facility];  Service: General;  Laterality: N/A;   FRACTURE SURGERY     HERNIA REPAIR     HIP CLOSED REDUCTION Right 06/02/2014   Procedure: CLOSED MANIPULATION HIP;  Surgeon: Maude KANDICE sheril, MD;  Location: [facility] OR;  Service: Orthopedics;  Laterality: Right;   IR FLUORO GUIDED NEEDLE PLC ASPIRATION/INJECTION LOC  08/09/2023   JOINT REPLACEMENT     JOINT REPLACEMENT     POSTERIOR LUMBAR FUSION 4 LEVEL N/A 06/26/2022   Procedure: Lumbar One To Lumbar Five Posterior Instrumented Fusion;  Surgeon: Cheryle Debby LABOR, MD;  Location: [facility] OR;  Service: Neurosurgery;  Laterality: N/A;   REFRACTIVE SURGERY Bilateral    SHOULDER ARTHROSCOPY Right    SHOULDER OPEN ROTATOR CUFF REPAIR Right    SPINAL FUSION  1996   t10 down to my coccyx   SPINE HARDWARE REMOVAL     TOTAL ABDOMINAL HYSTERECTOMY     TOTAL HIP ARTHROPLASTY Right 05/10/2014   [place]  by dr lonni ditty   TOTAL KNEE ARTHROPLASTY Left    TOTAL SHOULDER ARTHROPLASTY Left 08/19/2016   Procedure: TOTAL SHOULDER ARTHROPLASTY;  Surgeon: Eva Herring, MD;  Location: [facility] OR;  Service: Orthopedics;  Laterality: Left;  Left total shoulder replacement   Patient Active Problem List   Diagnosis Date Noted   Colovaginal fistula 11/30/2023   Diskitis 10/04/2023   Hypotension 09/06/2023   Fracture of tenth thoracic vertebra (HCC) 09/05/2023   History of discitis 09/05/2023   Senile osteoporosis 09/05/2023    History of urinary retention 09/05/2023   Vaginal atrophy 09/05/2023   Nocturia 09/05/2023   Vulvar ulcer 09/05/2023   Pulmonary nodule 08/02/2023   Non-traumatic compression fracture of vertebral column (HCC) 06/24/2023   Nonobstructive atherosclerosis of coronary artery 02/10/2023   Bilateral pseudophakia 02/03/2023   Vertebral fracture, osteoporotic (HCC) 01/06/2023   Ventriculitis of brain due to bacteria 11/02/2022   Anemia of chronic illness 10/12/2022   Bipolar disorder, in partial remission, most recent episode depressed (HCC) 10/07/2022   Adjustment disorder with mixed anxiety and depressed mood 09/30/2022   Chronic wound 09/25/2022   Hyperglycemia 09/25/2022   Chronic midline thoracic back pain 09/17/2022   Primary insomnia 09/17/2022   Chronic combined systolic and diastolic CHF (congestive heart failure) (HCC) 09/14/2022   Acute blood loss anemia 09/09/2022   Infection of lumbar spine (HCC) 09/03/2022   Malnutrition of moderate degree 08/20/2022   Hardware complicating wound infection (HCC) 08/20/2022   Infection of deep incisional surgical site after procedure 08/20/2022   Aspiration pneumonia of both lungs (HCC) 08/19/2022   Acute respiratory failure with hypoxia (HCC) 08/19/2022   Sepsis (HCC) 08/19/2022   Seizure (HCC) 08/19/2022   Wound dehiscence 08/09/2022   Delayed surgical wound healing 07/08/2022   Fracture of lumbar spine without cord injury (HCC) 06/26/2022   Lumbar vertebral fracture (HCC) 06/25/2022   Mood disorder (HCC) 09/29/2021   Generalized muscle weakness 04/30/2019   Mild cognitive impairment 09/06/2018   Hyponatremia 02/28/2018   S/P shoulder replacement, left 08/19/2016   Anxiety 10/17/2014   Acid reflux 10/17/2014   BP (high blood pressure) 10/17/2014   Arthritis, degenerative 10/17/2014   Adult hypothyroidism 10/17/2014   UTI (urinary tract infection) 06/03/2014   Fracture of bone adjacent to prosthesis 06/03/2014   Peri-prosthetic  fracture of femur following total hip arthroplasty 06/02/2014   Urinary retention 05/13/2014   Chronic pain 05/10/2014   History of hip surgery 05/10/2014   Osteoarthritis 09/30/2011   UNSPECIFIED HEART FAILURE 06/24/2010   UNSPECIFIED CONGENITAL DEFECT OF SEPTAL CLOSURE 06/24/2010   Secondary cardiomyopathy (HCC) 03/18/2010   Tobacco use 03/18/2010   DM 06/13/2009   HYPERTENSION, UNSPECIFIED 06/13/2009   VENTRICULAR TACHYCARDIA 06/13/2009   VENTRICULAR SEPTAL DEFECT, CONGENITAL 06/13/2009   Essential (primary) hypertension 06/13/2009   Diabetes mellitus type 2, noninsulin dependent (HCC) 06/13/2009    ONSET DATE: 01/05/2024 MD referral  REFERRING DIAG: R26.9 (ICD-10-CM) - Gait difficulty   THERAPY DIAG:  No diagnosis found.  Rationale for Evaluation and Treatment: Rehabilitation  SUBJECTIVE:  SUBJECTIVE STATEMENT: The standing w/ leg length correction causes severe and lasting back pain.  The forward flexing activities with the physioball also caused a lot of discomfort.   Pt accompanied by: self  PERTINENT HISTORY: Chronic osteomyelitis with worsening back pain; Hx of multiple R hip fractures with surgery to revisions, significant leg length difference with LLE longer than RLE, hx of infection post surgery in spine (treated on antibiotics) Per MD Note 01/09/2024:    Patient was seen by Dr. Elspeth Schultze who scheduled her for surgery and on  12/01/2023 she underwent   -ROBOTIC LOW ANTERIOR RECTOSIGMOID RESECTION (LAR) WITH ANASTOMOSIS -COLOVAGINAL FISTULA TAKEDOWN & REPAIR -OMENTOPEXY OF VAGINA -INTRAOPERATIVE ASSESSMENT OF TISSUE VASCULAR PERFUSION USING ICG (indocyanine green ) IMMUNOFLUORESCENCE -TRANSVERSUS ABDOMINIS PLANE (TAP) BLOCK - BILATERAL -FLEXIBLE SIGMOIDOSCOPY    Urology did  joint surgery and did    1.  Cystoscopy with bilateral ureteral FireFly injections 2.  Transurethral resection of  possible bladder tumor--large   Pathology came back with:   A. BLADDER TUMOR, TRANSURETHERAL RESECTON:  Florid cystitis cystica and papillary cystitis  Negative for carcinoma   B. RECTOSIGMOID COLON RESECTION:  Diverticula with chronic inflammation, perforation, fistula and reactive  fibrosis  Negative for carcinoma  Five benign lymph nodes (0/5)   C. FINAL DISTAL MARGIN:  Benign colonic tissue          PAIN:  Are you having pain? Yes: NPRS scale: 4/10 Pain location: R hip/back Pain description: throbbing, grinding, sharp Aggravating factors: more activities, [place] Relieving factors: rest  PRECAUTIONS: Fall and Other: Recent colectomy-pt reports no restrictions Pt has history of osteoporosis and compression fractures RED FLAGS: None   WEIGHT BEARING RESTRICTIONS: No  FALLS: Has patient fallen in last 6 months? No  LIVING ENVIRONMENT: Lives with: lives with their family Lives in: House/apartment Stairs: long ramp in/out of home Has following equipment at home: Vannie - 2 wheeled and Wheelchair (manual)  PLOF: Needs assistance with gait and Vocation/Vocational requirements: retired Runner, broadcasting/film/video, Tree surgeon; enjoys spending time with grandchildren in [place] and goes to [employer] weekly  PATIENT GOALS: To get stronger, to get back on a cane.  To use walker to get to the car (long ramp), get RLE into car better  OBJECTIVE:     TODAY'S TREATMENT: 02/23/24 Activity Comments                         TODAY'S TREATMENT: 02/21/24 Activity Comments  Seated anti-rotation (paloff press) 1x10 w/ red   Seated postural perturbations On dynadisc 3x30 sec On rocker board 2x30 sec  Supine LE PRE -hip add iso 30x -SAQ 3x10, 3# -heel slides 3x10 manual resistance               TODAY'S TREATMENT: 02/15/24 Activity Comments   Seated Pball forward flexion x10 Pball ab set 10x5 Hip flexion iso into Pball on pt's lap 10x5 Shoulder horizontal abd red TB x10, x6 to fatigue   Standing Row red TB 2x10 Shoulder ext red TB 2x10 With chair support  Gait x60' with RW SBA x2 turns Cues to keep feet in RW during turns  Step heel tap Fwd/toe tap bwd R&L to fatigue 1 hand hold for assist Cues to keep from locking out knees; able to perform x10 with L foot stepping, only 8 with R foot stepping            Access Code: [email or web address] URL: [email or web address] Date: 02/02/2024 Prepared by:  [facility] - Outpatient  Rehab - [facility]  Program Notes Seated leg slides out and in;  have a pillow case under your right foot-slide your foot out and in, 3 x 10 reps.  You can have a 3-5# ankle weight on your ankle  Exercises - Seated Hip Adduction Isometrics with Ball  - 1 x daily - 7 x weekly - 3 sets - 10 reps - Seated Hip Abduction with Resistance  - 1 x daily - 7 x weekly - 3 sets - 10 reps - Sit to Stand with Resistance Around Legs  - 1 x daily - 7 x weekly - 3 sets - 5 reps - Seated Shoulder Row with Anchored Resistance  - 1 x daily - 5 x weekly - 2 sets - 10 reps - Seated Anti-Rotation Press With Anchored Resistance  - 1 x daily - 7 x weekly - 3 sets - 10 reps     PATIENT EDUCATION: Education details: Reprinted exercises in HEP, as pt reports she lost her copy; explained rationale for seated/standing exercises to help with postural strengthening and discussed difference between muscle soreness versus joint/bony soreness with exercise Person educated: Patient Education method: Explanation, Demonstration, Tactile cues, Verbal cues, and Handouts Education comprehension: verbalized understanding and returned demonstration    --------------------------------------------------- Note: Objective measures were completed at Evaluation unless otherwise noted.  DIAGNOSTIC FINDINGS: MRI 09/2023    IMPRESSION: 1. Postoperative changes of T11-S1 laminectomy and posterior spinal fusion with right posterior spinal instrumentation from L2-L5. On prior CT, there is evidence of fracture through the left posterior fusion mass at L4-5, correlating with edema is seen in the left pedicle of L4 on today's study. 2. Decreased edema within the L4 and L5 vertebral bodies and posterior elements, as well as within the L4-5 and L3-4 discs, likely reactive and related to the above fracture. 3. Loculated simple fluid within the thecal sac with some marginalized cauda equina nerve roots, most consistent with arachnoiditis. 4. No spinal canal stenosis or neural foraminal narrowing.  COGNITION: Overall cognitive status: History of cognitive impairments - at baseline   SENSATION: Light touch: Impaired  and to light touch-see below Reports some numbness L foot  POSTURE: Leg length diffierence  LOWER EXTREMITY ROM:     Active  Right Eval Left Eval  Hip flexion    Hip extension    Hip abduction    Hip adduction    Hip internal rotation    Hip external rotation    Knee flexion    Knee extension    Ankle dorsiflexion    Ankle plantarflexion    Ankle inversion    Ankle eversion     (Blank rows = not tested)  LOWER EXTREMITY MMT:    MMT Right Eval Left Eval  Hip flexion 3- 3+  Hip extension    Hip abduction 4 4  Hip adduction 4 4  Hip internal rotation    Hip external rotation    Knee flexion 4 4  Knee extension 3+ 4  Ankle dorsiflexion 3+ 3+  Ankle plantarflexion    Ankle inversion    Ankle eversion    (Blank rows = not tested)   TRANSFERS: Sit to stand: SBA  Assistive device utilized: Environmental consultant - 2 wheeled     Stand to sit: SBA  Assistive device utilized: Environmental consultant - 2 wheeled      GAIT: Findings: Gait Characteristics: excess lateral weightshift through trunk due to significant leg length difference and step through pattern, Distance  walked: 50 ft, Assistive device  utilized:Walker - 2 wheeled, Level of assistance: CGA, and Comments: NA  FUNCTIONAL TESTS:  5 times sit to stand: 14.44 sec with BUE support Timed up and go (TUG): 24.53 sec 2 minute walk test: NT 10 meter walk test: 21.34 sec (1.54 ft/sec) TUG cognitive: (naming colors) 28.62 sec 26M walk back:  42.16 sec with RW Stroop gait:  13.25 sec in 10 ft (naming color words)-0.75 ft/sec   18.75 sec in 10 feet (naming printed colors)-0.53 ft/sec                                                                                                                               TREATMENT DATE: 01/26/2024    PATIENT EDUCATION: Education details: Eval results, POC Person educated: Patient Education method: Explanation Education comprehension: verbalized understanding  HOME EXERCISE PROGRAM: Not yet initiated Did perform eccentric hip flexion/extension in sitting  GOALS: Goals reviewed with patient? Yes  SHORT TERM GOALS: Target date: 02/24/2024  Pt will be independent with HEP for improved strength, balance, gait. Baseline: Goal status: IN PROGRESS  2.  Pt will improve TUG score to less than or equal to 18 sec for decreased fall risk. Baseline: 24.53 sec Goal status: IN PROGRESS  3.  Pt will improve 3 M Walk backwards to less than or equal to 30 seconds for decreased fall risk. Baseline: 42.16 sec Goal status: IN PROGRESS  4.  Pt will report at least 50% improvement in car transfers.  Baseline:  difficulty with RLE  Goal status:  IN PROGRESS  LONG TERM GOALS: Target date: 03/23/2024  Pt will be independent with HEP for improved strength, balance, gait. Baseline:  Goal status: IN PROGRESS  2.  Pt will improve gait velocity to at least 1.8 ft/sec for improved gait efficiency and safety. Baseline: 1.54 ft/sec Goal status: IN PROGRESS  3.  Pt will improve gait velocity with dual task to at least 1 ft/sec for improved gait and decreased fall risk. Baseline: 0.53-0.75 ft/sec with Stroop  over 10 ft Goal status: IN PROGRESS  4.  Pt will improve 5x sit<>stand to less than or equal to 11.5 sec with light UE support to demonstrate improved functional strength and transfer efficiency. Baseline: 14.44 sec Goal status: IN PROGRESS  5.  Pt will verbalize plans for continued community fitness upon d/c from PT to maximize gains made in PT. Baseline:  Goal status: IN PROGRESS  6.  Pt will ambulate at least 200 ft in 2 minute walk, for improved gait efficiency and endurance.  Baseline: 100 ft in 1:30  Goal status:  INITIAL, 01/30/2024   ASSESSMENT:  CLINICAL IMPRESSION: Instructed in static trunk strength activities to improve trunk strength/stability and adapted to avoid bending/twisting as pt reports poor response to these movements in past 2 sessions.  Tolerated static postural stabilization demands well without increase to back pain. LE PRE to improve strength and activity tolerance for increased ambulation  endurance. Continued sessions to progress activities to improve ambulation.  Pt reports ongoing back pain and discomfort with difficulty in positioning in w/c due to fixed pelvic obliquity and leg length discrepancy.  Demonstrated various w/c cushions to accommodate these deformities for improved comfort and pressure relief.   OBJECTIVE IMPAIRMENTS: Abnormal gait, decreased balance, decreased mobility, difficulty walking, decreased strength, impaired sensation, postural dysfunction, and pain.   ACTIVITY LIMITATIONS: sitting, standing, transfers, locomotion level, and caring for others  PARTICIPATION LIMITATIONS: driving, shopping, community activity, and travel  PERSONAL FACTORS: 3+ comorbidities: see PMH above are also affecting patient's functional outcome.   REHAB POTENTIAL: Good  CLINICAL DECISION MAKING: Evolving/moderate complexity  EVALUATION COMPLEXITY: Moderate  PLAN:  PT FREQUENCY: 2x/week  PT DURATION: 8 weeks plus eval visit  PLANNED INTERVENTIONS:  [id number]- Physical Performance Testing, 97110-Therapeutic exercises, 97530- Therapeutic activity, 97112- Neuromuscular re-education, 97535- Self Care, [id number]- Manual therapy, [phone]- Gait training, Patient/Family education, and Balance training  PLAN FOR NEXT SESSION: Continue lower extremity strengthening (encourage pt to be using weights/bands at home as able, but unsure as she thinks her weights are 6#, that this may be too much).  Ask about back pain and progress standing and gait tolerance; RLE strength for car transfers; pt requests not to perform Nustep d/t hip pain; pt reports that she would like to work towards being able to walk down her ramp and into her car (reports currently limited by endurance and balance)

## 2024-02-23 ENCOUNTER — Ambulatory Visit: Admitting: Physical Therapy

## 2024-02-23 ENCOUNTER — Encounter: Payer: Self-pay | Admitting: Physical Therapy

## 2024-02-23 DIAGNOSIS — R2689 Other abnormalities of gait and mobility: Secondary | ICD-10-CM

## 2024-02-23 DIAGNOSIS — M6281 Muscle weakness (generalized): Secondary | ICD-10-CM | POA: Diagnosis not present

## 2024-02-23 DIAGNOSIS — R2681 Unsteadiness on feet: Secondary | ICD-10-CM

## 2024-02-27 ENCOUNTER — Other Ambulatory Visit: Payer: Self-pay | Admitting: Neurosurgery

## 2024-02-27 DIAGNOSIS — S32001D Stable burst fracture of unspecified lumbar vertebra, subsequent encounter for fracture with routine healing: Secondary | ICD-10-CM

## 2024-02-29 ENCOUNTER — Encounter: Payer: Self-pay | Admitting: Neurosurgery

## 2024-03-05 NOTE — Progress Notes (Unsigned)
 Subjective:  Chief complaint: followup for diskitis, hardware associated osteomyelitis   Patient ID: [name], female    DOB: 18-Feb-1956, 68 y.o.   MRN: [id number]  HPI  68  y.o. female with hx of scoliosis surgery with extensive lower thoracic and lumbar fusion roughly 55yrs ago, she suffered ground level fall in late November, where she fractures through all three columns of L3, thus went to OR on 12/9 for stabilization, new  posterolateral instrumentation fusion on June 26, 2022 complicated by Wound dehiscence with exposed hardware and unstable lumbar spine fracture status post revision of lumbar wound with removal of L1-3 4 and 5 lumbar screws and rod and removal of right L1 screw and revision of L1-L5 posterior instrumented fusion on August 09, 2022 and now concern for postoperative wound infection and ventriculitis. She had complicated protracted course in the icu, but eventually was discharged to CIR on vancomycin  and cefepime  through 3/14 for ventriculitis and hw complicating wound infection. Now changed to doxcycyline plus levofloxacin  as of 3/15 while she continued to need medihoney with moistened quaze with sliver hydrofiber to the caudal portion of wound.   Mri of brain on 09/29/22- resolution of ventriculitis.    Interim history:  Back pain did not appear to be worse but wound did persist and apparently tunneled cranially.  Her white blood cell count with [facility] was in the 16,000 range an abrupt change from her last labs as an inpatient when it was in the 7000 range.  He did have a mechanical fall and fell on her left knee where she has a prosthetic joint.  When I last saw her we rechecked her inflammatory markers and sed rate had titrated down though CRP was going up to 15.6  WBC recheck with our lab was 12,000.  In the interim she has been seen by Dr. Rockney with neurosurgery and CT of the thoracic and lumbar spine were performed.  These have  shown: A new T10 compression fracture with 12% loss of height with unchanged change adjacent chronic T10-T11 T12-L1 posterior element ankylosis with chronic nonfusion at T11-T12, L3 superior endplate nonacute fracture and subtle horizontal fracture of the chronic posterior element of the bone, difficult exclude nondisplaced S3-4 sacral fracture with mild presacral stranding.   Checked labs last time we saw her white blood cell count remained elevated 12,000.  Sed rate and CRP [place] sed rate actually normalized and CRP had gone up.  Changed her evofloxacin and doxycycline  to cefdinir  and doxycycline .  She is continuing to take these.  She did develop a painful erythematous rash on her hands due to doxycycline  use in the context of sun exposure at the beach.  This is subsequently resolved she has a new somewhat purpuric rash on her right arm that came up in the last few days  She returns to clinic today for follow-up  Discussed the use of AI scribe software for clinical note transcription with the patient, who gave verbal consent to proceed.  At her last visit with me on July 14, 2023 the patient had developed worsening pain in the thoracic spine and hip.  She was also with reduced strength and mobility with having to to now use a walker compared to a cane.  The patient had an MRI of the thoracic and lumbar spine pending after an initial scan done with [facility] had suggested potential infection in the spine.  She also has been found to have a 8 mm solid mass in  the right lung on CT scan.  The MRI was ultimately read and showed  IMPRESSION: 1. Fluid signal within the L3-L4 and L4-L5 discs is suspicious for discitis-osteomyelitis. Progressed bone marrow edema within the L4 vertebral body and bilateral pedicles. There is also increased bone marrow edema at the superior endplate of L5 and right L5 pedicle. 2. Possible subacute superior endplate fractures of the L4 and  L5 vertebral bodies without associated height loss. 3. Minimal residual marrow edema associated with L3 superior endplate fracture. 4. 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  The case was discussed with Dr. Carollee and we both agreed that it would be prudent to have the patient's stop antibiotics and pursue IR guided biopsy for culture after she had been off antibiotics for several weeks.  I called the patient and her daughter on the 26th in the evening and asked that she stop antibiotics so that we could get a culture after she had been off antibiotics for several weeks.  She did not stop antibiotics that night.  In the interim she did have worsening of her back pain she underwent IR guided biopsy with culture yesterday with Gram stain having been negative no organisms isolated yet to date.  With regards to the her lung nodule we have done lab work here including a cryptococcal antigen in the serum which is negative urine histoplasma antigen and Blastomyces antigen.  She has been seen by Dr. Shelah who noted that there was a pneumonia in the area where the lung nodule was seen and he wondered if this finding read as a nodule that might instead represent scarring.  He wanted to pursue further imaging with a PET scan before considering bronchoscopic be with BAL and biopsies.  At her visit with me on December 26th, 2024 patient had worsening pain in an thoracic and lumbar spine after initial scan with [facility] had suggested potential infection in the spine.  She was then found to have an 8 mm solid mass in the right lung on CT scan MRI was ultimately read and showed fluid signal within the L3-L4 and L4-L5 disc suspicious for discitis osteomyelitis with progressed bone marrow edema in the L4 vertebral body and bilateral pedicles also with increased bone marrow edema in the superior endplate of L5 and right L5 pedicle with possible versus subacute.  Endplate fractures  in L4 and L5 with minimal residual bone marrow associated L3 superior endplate fracture with chronic findings of arachnoiditis in the lower lumbar spine.  I called the patient and her daughter on the 26 and had them stop antibiotics with plans for IR guided biopsy for culture.  This was accomplished after they had held antibiotics for several weeks.  Cultures were unrevealing.  We subsequently started her on yet another empiric regimen of oral doxycycline  and cefadroxil  until we could get a line placed at that point in time we placed her on IV daptomycin and cefepime .  In the interim she had complained of vaginal discharge though now I have found out that she has a rectovaginal fistula which could certainly explain that problem.  She then began to complain of malaise with labile blood pressures and lightheadedness nauseousness with tachycardia.    The patient thought there is due to side effects of IV antibiotics and we instructed  her hold IV cefepime  and continue myosin but she actually did the reverse and stop the daptomycin and continued cefepime .  Recently yesterday when she was seen with cardiology she  was hypotensive seated in the chair in the 60s and apparently they had recommended her being seen in the emergency room and potentially hospitalized.  She was given fluid bolus there with improvement in her blood pressure and her losartan  is on hold  Her back pain does not seem to be worse she has no respiratory complaints she is not complaining now of much in the way of nausea or abdominal pain.  She is does continue to have discharge from her rectovaginal fistula and also gas that will emanate from this.  I am quite worried that I have other intra-abdominal pathology such as intra-abdominal abscess or fistula to another organ that is causing all of her symptoms of malaise palpitations nausea.  Review of her labs from home health showed slight leukocytosis of white count to 14,000 with  slight eosinophilia though not terribly much above the normal range her inflammatory markers have come down in the last 2 weeks from 72-23 in the case of her sed rate and 30 116 and 51 in terms of her CRP.  She had been seen by urogynecology who had found frank stool in the vagina from the fistula.  They recommended her being seen by general surgery and wanted a CT scan of the abdomen pelvis performed I think getting a CT scan abdomen pelvis is critical and I would feel better if we could have her directly admitted to the hospital to speed up the process of getting a CT of the abdomen pelvis because I am worried that she may have more pathology than the rectovaginal fistula such as an intra-abdominal abscess driving symptoms.  However Dickey is emphatic about going back into the hospital if at all possible.    She had her CT abdomen pelvis performed which showed the following:  IMPRESSION: Image degradation in lower pelvis due to bilateral hip prostheses and rectal tube.   Findings highly suspicious for gas-containing rectovaginal fistula,  With linear gas collection between the anterior wall of the sigmoid colon and bladder to the left of midline which descends to the region of the vaginal cuff (see image 83/12), highly suspicious for rectovaginal fistula.   Diffuse colonic diverticulosis, with muscular hypertrophy and sigmoid colon. No radiographic evidence of acute diverticulitis.   Multiple ill-defined ground-glass and subsolid nodular opacities in the visualized portions of the right middle and lower lobes. This is almost certainly inflammatory or infectious in etiology given that it is a new compared to recent PET-CT 1 month ago. Recommend clinical correlation, and continued follow-up by chest CT.    I saw the report of the CT scan with regards to the amatory groundglass opacity had some anxiety about whether she could be having some daptomycin induced eosinophilic pneumonia.   The patient however has no respiratory symptoms other than some chronic dyspnea that preceded her being on antibiotic she has no cough.  Her CBC shows no peripheral eosinophilia and I do not think she has daptomycin induced developed pneumonia.  She still has GI upset but is been able to take her daptomycin and cefepime  with planned stop date of 18 March.  Her inflammatory markers have been a bit of a mixed picture with her sed rate having gone up to 59 from a value of 23 but that was down from 72 her CRP has come down consistently now at 14 from 51 and 116 prior to that her back pain seems relatively stable though it is worse than it was when she first was dealing with  her ventriculitis.  She does have a compression fracture as well.  Surgery is being contemplated by her urogynecologist potentially with general surgery involvement as well.  In talking the patient and her daughter it seems as if the surgeons want her back infection declared cured prior to operating.  I do not think that she should be necessary because her spine if she still harbors infection is of much lower risk to her as far as uncontrolled infection than her rectovaginal fistula.   I have a much greater anxiety her nto have source control in her abdomen and pelvis.     Her pain had been significantly worse at last visit we ordered MRI of the spine:  The scan was read as follows    IMPRESSION: 1. Postoperative changes of T11-S1 laminectomy and posterior spinal fusion with right posterior spinal instrumentation from L2-L5. On prior CT, there is evidence of fracture through the left posterior fusion mass at L4-5, correlating with edema is seen in the left pedicle of L4 on today's study. 2. Decreased edema within the L4 and L5 vertebral bodies and posterior elements, as well as within the L4-5 and L3-4 discs, likely reactive and related to the above fracture. 3. Loculated simple fluid within the thecal sac with  some marginalized cauda equina nerve roots, most consistent with arachnoiditis. 4. No spinal canal stenosis or neural foraminal narrowing.   Patient was seen by Dr. Elspeth Schultze who scheduled her for surgery and on  12/01/2023 she underwent  -ROBOTIC LOW ANTERIOR RECTOSIGMOID RESECTION (LAR) WITH ANASTOMOSIS -COLOVAGINAL FISTULA TAKEDOWN & REPAIR -OMENTOPEXY OF VAGINA -INTRAOPERATIVE ASSESSMENT OF TISSUE VASCULAR PERFUSION USING ICG (indocyanine green ) IMMUNOFLUORESCENCE -TRANSVERSUS ABDOMINIS PLANE (TAP) BLOCK - BILATERAL -FLEXIBLE SIGMOIDOSCOPY   Urology did joint surgery and did   1.  Cystoscopy with bilateral ureteral FireFly injections 2.  Transurethral resection of  possible bladder tumor--large  Pathology came back with:  A. BLADDER TUMOR, TRANSURETHERAL RESECTON:  Florid cystitis cystica and papillary cystitis  Negative for carcinoma   B. RECTOSIGMOID COLON RESECTION:  Diverticula with chronic inflammation, perforation, fistula and reactive  fibrosis  Negative for carcinoma  Five benign lymph nodes (0/5)   C. FINAL DISTAL MARGIN:  Benign colonic tissue   When I last saw her to go on a beach trip and was concerned about doxycycline  causing severe sun exposed rash.  We had her stop the doxycycline  when she was on the beach trip asked her to pay attention to her back pain and it did not worsen while the beach trip but she resumed doxycycline  along with cefadroxil  when she returned.  Discussed the use of AI scribe software for clinical note transcription with the patient, who gave verbal consent to proceed.  History of Present Illness   [name] is a 68 year old female who presents with worsening back pain.  Her back pain has worsened over the past month or two, described as a constant presence with a sensation of a 'knot' in her back at the site of a previous fusion. She likens the sensation to a 'beehive'.  The back pain initially flared up a year  ago before we got IR guided aspirate and gave her  IV antibiotics for six weeks. At that time, blood work indicated elevated white blood cells and possibly increased inflammatory markers. She is currently taking doxycycline  and cefadroxil .  She is concerned about the possibility of osteomyelitis and expresses worry about this.  Past Medical History:  Diagnosis Date   Anemia    Anxiety    Arthritis    Bipolar disorder (HCC)    Cardiomyopathy (HCC)    Complication of anesthesia    Awake but could not move   Depression    Diskitis 10/04/2023   GERD (gastroesophageal reflux disease)    Heart failure (HCC)    Acute   Heart murmur    related to VSD   High cholesterol    History of blood transfusion    related to OR (08/19/2016)   History of hiatal hernia    Hypertension    Hyperthyroidism    Hypotension 09/06/2023   Mild cognitive impairment 09/06/2018   Paroxysmal ventricular tachycardia (HCC)    Pneumonia    Rectovaginal fistula 09/06/2023   Tachycardia, unspecified    Type II diabetes mellitus (HCC)    UTI (urinary tract infection)    being treated with Keflex    Ventricular septal defect    Ventriculitis of brain due to bacteria 11/02/2022   Vertebral fracture, osteoporotic (HCC) 01/06/2023    Past Surgical History:  Procedure Laterality Date   ABDOMINAL HYSTERECTOMY     BACK SURGERY     CARDIAC CATHETERIZATION N/A 06/23/2015   Procedure: Left Heart Cath and Coronary Angiography;  Surgeon: Ezra GORMAN Shuck, MD;  Location: [facility] INVASIVE CV LAB;  Service: Cardiovascular;  Laterality: N/A;   CARDIAC CATHETERIZATION  1960   VSD was so small; didn't need repaired   COLECTOMY, SIGMOID, ROBOT-ASSISTED N/A 11/30/2023   Procedure: COLECTOMY, SIGMOID, ROBOT-ASSISTED ROBOTIC LOW ANTERIOR RECTOSIGMOID RESECTION (LAR) WITH ANASTOMOSIS COLOVAGINAL FISTULA TAKEDOWN & REPAIR OMENTOPEXY OF VAGINA INTRAOPERATIVE ASSESSMENT OF TISSUE VASCULAR PERFUSION  USING ICG (indocyanine green ) IMMUNOFLUORESCENCE TRANSVERSUS ABDOMINIS PLANE (TAP) BLOCK - BILATERAL;  Surgeon: Sheldon Standing, MD;  Location: [facility] ORS;  Service: General;  Late   CYSTOSCOPY WITH INDOCYANINE GREEN  IMAGING (ICG) N/A 11/30/2023   Procedure: Cystoscopy with bilateral ureteral FireFly injections Transurethral resection of bladder tumor--large;  Surgeon: Carolee Sherwood JONETTA DOUGLAS, MD;  Location: [facility] OR[facility];  Service: Urology;  Laterality: N/A;   EXAM UNDER ANESTHESIA WITH MANIPULATION OF HIP Right 06/02/2014   dr sheril   FLEXIBLE SIGMOIDOSCOPY N/A 11/30/2023   Procedure: KINGSTON SIDE;  Surgeon: Sheldon Standing, MD;  Location: [facility] OR[facility];  Service: General;  Laterality: N/A;   FRACTURE SURGERY     HERNIA REPAIR     HIP CLOSED REDUCTION Right 06/02/2014   Procedure: CLOSED MANIPULATION HIP;  Surgeon: Maude KANDICE sheril, MD;  Location: [facility] OR;  Service: Orthopedics;  Laterality: Right;   IR FLUORO GUIDED NEEDLE PLC ASPIRATION/INJECTION LOC  08/09/2023   JOINT REPLACEMENT     JOINT REPLACEMENT     POSTERIOR LUMBAR FUSION 4 LEVEL N/A 06/26/2022   Procedure: Lumbar One To Lumbar Five Posterior Instrumented Fusion;  Surgeon: Cheryle Debby LABOR, MD;  Location: [facility] OR;  Service: Neurosurgery;  Laterality: N/A;   REFRACTIVE SURGERY Bilateral    SHOULDER ARTHROSCOPY Right    SHOULDER OPEN ROTATOR CUFF REPAIR Right    SPINAL FUSION  1996   t10 down to my coccyx   SPINE HARDWARE REMOVAL     TOTAL ABDOMINAL HYSTERECTOMY     TOTAL HIP ARTHROPLASTY Right 05/10/2014   [place]      by dr lonni olcott   TOTAL KNEE ARTHROPLASTY Left    TOTAL SHOULDER ARTHROPLASTY Left 08/19/2016   Procedure: TOTAL SHOULDER ARTHROPLASTY;  Surgeon: Eva Herring, MD;  Location: [facility] OR;  Service: Orthopedics;  Laterality: Left;  Left total shoulder replacement    Family History  Problem Relation Age of Onset   Other Mother        alive   Stroke Father 36       deceased   Dementia Father    Chorea Maternal  Grandfather    Dementia Maternal Aunt    Dementia Maternal Aunt    Heart attack Other        multiple uncles have died with myocardial infarction   Bladder Cancer Neg Hx    Uterine cancer Neg Hx       Social History   Socioeconomic History   Marital status: Married    Spouse name: Not on file   Number of children: Not on file   Years of education: Not on file   Highest education level: Master's degree (e.g., MA, MS, MEng, MEd, MSW, MBA)  Occupational History   Occupation: Magazine features editor: SELF-EMPLOYED    Comment: former  Tobacco Use   Smoking status: Former    Types: Cigarettes    Passive exposure: Never   Smokeless tobacco: Never   Tobacco comments:    03/30/21 smokes 2 cigs daily.      08/06/22 GLENWOOD Raisin stated that the patient is not smoking while at [facility] for rehab. KM    11-23-23 has been quit for 3 weeks  Vaping Use   Vaping status: Never Used  Substance and Sexual Activity   Alcohol use: No   Drug use: No   Sexual activity: Not Currently    Partners: Male    Birth control/protection: Surgical    Comment: Hysterectomy  Other Topics Concern   Not on file  Social History Narrative   Right handed   Caffeine  2-3 cups daily    Lives at home with husband    Social Drivers of Health   Financial Resource Strain: Low Risk  (07/08/2023)   Received from [facility]   Overall Financial Resource Strain (CARDIA)    Difficulty of Paying Living Expenses: Not hard at all  Food Insecurity: No Food Insecurity (11/30/2023)   Hunger Vital Sign    Worried About Running Out of Food in the Last Year: Never true    Ran Out of Food in the Last Year: Never true  Transportation Needs: No Transportation Needs (11/30/2023)   PRAPARE - Administrator, Civil Service (Medical): No    Lack of Transportation (Non-Medical): No  Physical Activity: Not on file  Stress: Not on file  Social Connections: Socially Integrated (11/30/2023)   Social  Connection and Isolation Panel    Frequency of Communication with Friends and Family: Twice a week    Frequency of Social Gatherings with Friends and Family: Twice a week    Attends Religious Services: 1 to 4 times per year    Active Member of [employer] or Organizations: No    Attends Engineer, structural: 1 to 4 times per year    Marital Status: Married    Allergies  Allergen Reactions   Gallium (Radioactive Isotope) Swelling    Pt states she can not have radioactive injections.    Paxlovid [Nirmatrelvir-Ritonavir]      Current Outpatient Medications:    acetaminophen  (TYLENOL ) 325 MG tablet, Take 1-2 tablets (325-650 mg total) by mouth every 6 (six) hours as needed for mild pain or headache., Disp: , Rfl:    ascorbic acid  (VITAMIN C ) 500 MG tablet, Take 500 mg by mouth daily.,  Disp: , Rfl:    ASPIRIN  81 PO, Take 81 mg by mouth daily., Disp: , Rfl:    buPROPion  (WELLBUTRIN  XL) 150 MG 24 hr tablet, Take 1 tablet (150 mg total) by mouth daily., Disp: 30 tablet, Rfl: 0   busPIRone  (BUSPAR ) 7.5 MG tablet, Take 7.5 mg by mouth 2 (two) times daily., Disp: , Rfl:    Calcium  Carb-Cholecalciferol  (CALCIUM  600+D3) 600-20 MG-MCG TABS, Take 1 tablet by mouth in the morning and at bedtime., Disp: , Rfl:    carvedilol  (COREG ) 25 MG tablet, Take 1 tablet (25 mg total) by mouth 2 (two) times daily with a meal., Disp: 60 tablet, Rfl: 0   Cholecalciferol  (VITAMIN D3) 50 MCG (2000 UT) capsule, Take 2,000 Units by mouth daily., Disp: , Rfl:    cyclobenzaprine  (FLEXERIL ) 10 MG tablet, Take 10 mg by mouth 3 (three) times daily., Disp: , Rfl:    denosumab (PROLIA) 60 MG/ML SOSY injection, Inject 60 mg into the skin every 6 (six) months., Disp: , Rfl:    diphenoxylate -atropine  (LOMOTIL ) 2.5-0.025 MG tablet, Take 1 tablet by mouth 2 (two) times daily as needed for diarrhea or loose stools., Disp: 30 tablet, Rfl: 0   donepezil  (ARICEPT ) 10 MG tablet, Take 10 mg by mouth every morning., Disp: , Rfl:     doxycycline  (VIBRA -TABS) 100 MG tablet, Take 1 tablet (100 mg total) by mouth 2 (two) times daily., Disp: 60 tablet, Rfl: 11   empagliflozin  (JARDIANCE ) 10 MG TABS tablet, Take 1 tablet (10 mg total) by mouth daily before breakfast., Disp: 90 tablet, Rfl: 3   estradiol  (ESTRACE ) 0.1 MG/GM vaginal cream, Place 0.5g nightly for two weeks then twice a week after, Disp: 30 g, Rfl: 3   famotidine  (PEPCID ) 20 MG tablet, Take 1 tablet (20 mg total) by mouth 2 (two) times daily. (Patient taking differently: Take 20 mg by mouth every evening.), Disp: 60 tablet, Rfl: 0   fenoprofen (NALFON ) 600 MG TABS tablet, Take 600 mg by mouth in the morning and at bedtime., Disp: , Rfl:    fluvoxaMINE  (LUVOX ) 100 MG tablet, Take 1 tablet (100 mg total) by mouth at bedtime., Disp: 30 tablet, Rfl: 0   gabapentin  (NEURONTIN ) 100 MG capsule, Take 1 capsule (100 mg total) by mouth 3 (three) times daily., Disp: 90 capsule, Rfl: 0   Lactobacillus (ACIDOPHILUS) 100 MG CAPS, Take 1 capsule (100 mg total) by mouth 2 (two) times daily. (Patient taking differently: Take 250 mg by mouth daily.), Disp: 60 capsule, Rfl: 0   lamoTRIgine  (LAMICTAL ) 100 MG tablet, Take 200 mg by mouth at bedtime., Disp: , Rfl:    levothyroxine  (SYNTHROID ) 88 MCG tablet, Take 1 tablet (88 mcg total) by mouth daily before breakfast., Disp: 30 tablet, Rfl: 0   losartan  (COZAAR ) 25 MG tablet, Take 0.5 tablets (12.5 mg total) by mouth daily., Disp: , Rfl:    Melatonin 10 MG TABS, Take 10 mg by mouth at bedtime., Disp: , Rfl:    metFORMIN  (GLUCOPHAGE ) 500 MG tablet, Take 1 tablet (500 mg total) by mouth 2 (two) times daily with a meal., Disp: 60 tablet, Rfl: 0   Multiple Vitamin (MULTIVITAMIN) tablet, Take 1 tablet by mouth daily.  , Disp: , Rfl:    Multiple Vitamins-Minerals (ZINC PO), Take 50 mg by mouth daily., Disp: , Rfl:    nicotine  (NICODERM CQ  - DOSED IN MG/24 HOURS) 21 mg/24hr patch, Place 21 mg onto the skin daily., Disp: , Rfl:    nicotine  polacrilex  (NICORETTE )  4 MG gum, Take 4 mg by mouth as needed for smoking cessation. (Patient not taking: Reported on 02/13/2024), Disp: , Rfl:    ondansetron  (ZOFRAN ) 4 MG tablet, Take 1 tablet (4 mg total) by mouth every 8 (eight) hours as needed for refractory nausea / vomiting., Disp: 20 tablet, Rfl: 0   pantoprazole  (PROTONIX ) 40 MG tablet, Take 40 mg by mouth every morning., Disp: , Rfl:    potassium chloride  (KLOR-CON ) 10 MEQ tablet, Take 20 mEq by mouth 3 (three) times daily., Disp: , Rfl:    rosuvastatin  (CRESTOR ) 20 MG tablet, Take 20 mg by mouth daily., Disp: , Rfl:    sodium chloride  1 g tablet, Take 1 tablet (1 g total) by mouth 2 (two) times daily with a meal., Disp: 60 tablet, Rfl: 0   Suzetrigine  50 MG TABS, Take 50 mg by mouth every 12 (twelve) hours as needed. Take two 50mg  capsules for your first dose at the start of a pain episode on an empty stomach, followed by 50mg  (1 capsule)  with or without food every 12 hours until pain episode is improved, Disp: 29 tablet, Rfl: 1   traZODone  (DESYREL ) 100 MG tablet, Take 1 tablet (100 mg total) by mouth at bedtime., Disp: 30 tablet, Rfl: 0   VRAYLAR  1.5 MG capsule, Take 1.5 mg by mouth at bedtime., Disp: , Rfl:    Review of Systems  Constitutional:  Negative for activity change, appetite change, chills, diaphoresis, fatigue, fever and unexpected weight change.  HENT:  Negative for congestion, rhinorrhea, sinus pressure, sneezing, sore throat and trouble swallowing.   Eyes:  Negative for photophobia and visual disturbance.  Respiratory:  Negative for cough, chest tightness, shortness of breath, wheezing and stridor.   Cardiovascular:  Negative for chest pain, palpitations and leg swelling.  Gastrointestinal:  Negative for abdominal distention, abdominal pain, anal bleeding, blood in stool, constipation, diarrhea, nausea and vomiting.  Genitourinary:  Negative for difficulty urinating, dysuria, flank pain and hematuria.  Musculoskeletal:  Positive  for back pain. Negative for arthralgias, gait problem, joint swelling and myalgias.  Skin:  Negative for color change, pallor, rash and wound.  Neurological:  Negative for dizziness, tremors, weakness and light-headedness.  Hematological:  Negative for adenopathy. Does not bruise/bleed easily.  Psychiatric/Behavioral:  Negative for agitation, behavioral problems, confusion, decreased concentration, dysphoric mood and sleep disturbance.        Objective:   Physical Exam Constitutional:      General: She is not in acute distress.    Appearance: Normal appearance. She is well-developed. She is not ill-appearing or diaphoretic.  HENT:     Head: Normocephalic and atraumatic.     Right Ear: Hearing and external ear normal.     Left Ear: Hearing and external ear normal.     Nose: No nasal deformity or rhinorrhea.  Eyes:     General: No scleral icterus.    Conjunctiva/sclera: Conjunctivae normal.     Right eye: Right conjunctiva is not injected.     Left eye: Left conjunctiva is not injected.     Pupils: Pupils are equal, round, and reactive to light.  Neck:     Vascular: No JVD.  Cardiovascular:     Rate and Rhythm: Normal rate and regular rhythm.     Heart sounds: S1 normal and S2 normal.  Pulmonary:     Effort: Respiratory distress present.  Abdominal:     General: There is no distension.     Palpations: Abdomen is soft.  Musculoskeletal:        General: Normal range of motion.     Right shoulder: Normal.     Left shoulder: Normal.     Cervical back: Normal range of motion and neck supple.     Right hip: Normal.     Left hip: Normal.     Right knee: Normal.     Left knee: Normal.  Lymphadenopathy:     Head:     Right side of head: No submandibular, preauricular or posterior auricular adenopathy.     Left side of head: No submandibular, preauricular or posterior auricular adenopathy.     Cervical: No cervical adenopathy.     Right cervical: No superficial or deep cervical  adenopathy.    Left cervical: No superficial or deep cervical adenopathy.  Skin:    General: Skin is warm and dry.     Coloration: Skin is not pale.     Findings: No abrasion, bruising, ecchymosis, erythema, lesion or rash.     Nails: There is no clubbing.  Neurological:     General: No focal deficit present.     Mental Status: She is alert and oriented to person, place, and time.     Cranial Nerves: Cranial nerve deficit: .apsec.     Sensory: No sensory deficit.     Coordination: Coordination normal.     Gait: Gait normal.  Psychiatric:        Attention and Perception: She is attentive.        Mood and Affect: Mood normal.        Speech: Speech normal.        Behavior: Behavior normal. Behavior is cooperative.        Thought Content: Thought content normal.        Judgment: Judgment normal.    She does have a protuberant area in Lumbar spine      Assessment & Plan:   Assessment and Plan    Chronic lumbar back pain after spinal fusion Chronic lumbar back pain at the fusion site worsened. CT scan ordered to evaluate suspected metal protrusion. Obviously my concern is for worsening hardware associated osteomyelitis, diskitis of lumbar spine --check ESR, CRP CBC BMP - Call radiology to expedite CT scan reading. - Evaluate CT scan results before considering MRI. --continue doxycycline  and augmentin  - Schedule follow-up in one month to reassess pain and review imaging.      Colovaginal fistula: sp repair by Dr. Sheldon

## 2024-03-06 ENCOUNTER — Ambulatory Visit
Admission: RE | Admit: 2024-03-06 | Discharge: 2024-03-06 | Disposition: A | Discharge status: Home / Self Care | Source: Ambulatory Visit | Attending: Neurosurgery | Admitting: Neurosurgery

## 2024-03-06 DIAGNOSIS — S32001D Stable burst fracture of unspecified lumbar vertebra, subsequent encounter for fracture with routine healing: Secondary | ICD-10-CM

## 2024-03-07 ENCOUNTER — Ambulatory Visit: Payer: Self-pay | Admitting: Infectious Disease

## 2024-03-07 ENCOUNTER — Other Ambulatory Visit: Payer: Self-pay

## 2024-03-07 ENCOUNTER — Encounter: Payer: Self-pay | Admitting: Infectious Disease

## 2024-03-07 VITALS — BP 148/88 | HR 77 | Temp 97.7°F

## 2024-03-07 DIAGNOSIS — B9689 Other specified bacterial agents as the cause of diseases classified elsewhere: Secondary | ICD-10-CM

## 2024-03-07 DIAGNOSIS — S32009G Unspecified fracture of unspecified lumbar vertebra, subsequent encounter for fracture with delayed healing: Secondary | ICD-10-CM

## 2024-03-07 DIAGNOSIS — M4326 Fusion of spine, lumbar region: Secondary | ICD-10-CM

## 2024-03-07 DIAGNOSIS — M545 Low back pain, unspecified: Secondary | ICD-10-CM

## 2024-03-07 DIAGNOSIS — T847XXD Infection and inflammatory reaction due to other internal orthopedic prosthetic devices, implants and grafts, subsequent encounter: Secondary | ICD-10-CM

## 2024-03-07 DIAGNOSIS — M4645 Discitis, unspecified, thoracolumbar region: Secondary | ICD-10-CM

## 2024-03-07 DIAGNOSIS — N824 Other female intestinal-genital tract fistulae: Secondary | ICD-10-CM

## 2024-03-08 ENCOUNTER — Encounter: Payer: Self-pay | Admitting: Physical Therapy

## 2024-03-08 ENCOUNTER — Telehealth: Payer: Self-pay

## 2024-03-08 ENCOUNTER — Ambulatory Visit: Admitting: Physical Therapy

## 2024-03-08 DIAGNOSIS — R2681 Unsteadiness on feet: Secondary | ICD-10-CM

## 2024-03-08 DIAGNOSIS — R2689 Other abnormalities of gait and mobility: Secondary | ICD-10-CM

## 2024-03-08 DIAGNOSIS — M6281 Muscle weakness (generalized): Secondary | ICD-10-CM

## 2024-03-08 LAB — BASIC METABOLIC PANEL WITHOUT GFR
BUN: 20 mg/dL (ref 7–25)
CO2: 24 mmol/L (ref 20–32)
Calcium: 9.4 mg/dL (ref 8.6–10.4)
Chloride: 101 mmol/L (ref 98–110)
Creat: 0.54 mg/dL (ref 0.50–1.05)
Glucose, Bld: 111 mg/dL — ABNORMAL HIGH (ref 65–99)
Potassium: 4.9 mmol/L (ref 3.5–5.3)
Sodium: 135 mmol/L (ref 135–146)

## 2024-03-08 LAB — CBC WITH DIFFERENTIAL/PLATELET
Absolute Lymphocytes: 2283 {cells}/uL (ref 850–3900)
Absolute Monocytes: 1045 {cells}/uL — ABNORMAL HIGH (ref 200–950)
Basophils Absolute: 116 {cells}/uL (ref 0–200)
Basophils Relative: 0.9 %
Eosinophils Absolute: 426 {cells}/uL (ref 15–500)
Eosinophils Relative: 3.3 %
HCT: 37.7 % (ref 35.0–45.0)
Hemoglobin: 12.4 g/dL (ref 11.7–15.5)
MCH: 30.8 pg (ref 27.0–33.0)
MCHC: 32.9 g/dL (ref 32.0–36.0)
MCV: 93.8 fL (ref 80.0–100.0)
MPV: 8.6 fL (ref 7.5–12.5)
Monocytes Relative: 8.1 %
Neutro Abs: 9030 {cells}/uL — ABNORMAL HIGH (ref 1500–7800)
Neutrophils Relative %: 70 %
Platelets: 451 Thousand/uL — ABNORMAL HIGH (ref 140–400)
RBC: 4.02 Million/uL (ref 3.80–5.10)
RDW: 15 % (ref 11.0–15.0)
Total Lymphocyte: 17.7 %
WBC: 12.9 Thousand/uL — ABNORMAL HIGH (ref 3.8–10.8)

## 2024-03-08 LAB — SEDIMENTATION RATE: Sed Rate: 11 mm/h (ref 0–30)

## 2024-03-08 LAB — C-REACTIVE PROTEIN: CRP: <3 mg/L (ref <8.0)

## 2024-03-08 NOTE — Therapy (Signed)
 OUTPATIENT PHYSICAL THERAPY NEURO TREATMENT NOTE   Patient Name: [name] MRN: [id number] DOB:01-Oct-1955, 68 y.o., female Today's Date: 03/08/2024   PCP: Rolinda Millman, MD  REFERRING PROVIDER: Urbano Albright, MD      END OF SESSION:  PT End of Session - 03/08/24 1314     Visit Number 9    Number of Visits 17    Date for PT Re-Evaluation 04/19/24    Authorization Type BCBS Medicare    Progress Note Due on Visit 10    PT Start Time 1316    PT Stop Time 1357    PT Time Calculation (min) 41 min    Equipment Utilized During Treatment Gait belt    Activity Tolerance Patient tolerated treatment well;Patient limited by pain    Behavior During Therapy WFL for tasks assessed/performed           Past Medical History:  Diagnosis Date   Anemia    Anxiety    Arthritis    Bipolar disorder (HCC)    Cardiomyopathy (HCC)    Complication of anesthesia    Awake but could not move   Depression    Diskitis 10/04/2023   GERD (gastroesophageal reflux disease)    Heart failure (HCC)    Acute   Heart murmur    related to VSD   High cholesterol    History of blood transfusion    related to OR (08/19/2016)   History of hiatal hernia    Hypertension    Hyperthyroidism    Hypotension 09/06/2023   Mild cognitive impairment 09/06/2018   Paroxysmal ventricular tachycardia (HCC)    Pneumonia    Rectovaginal fistula 09/06/2023   Tachycardia, unspecified    Type II diabetes mellitus (HCC)    UTI (urinary tract infection)    being treated with Keflex    Ventricular septal defect    Ventriculitis of brain due to bacteria 11/02/2022   Vertebral fracture, osteoporotic (HCC) 01/06/2023   Past Surgical History:  Procedure Laterality Date   ABDOMINAL HYSTERECTOMY     BACK SURGERY     CARDIAC CATHETERIZATION N/A 06/23/2015   Procedure: Left Heart Cath and Coronary Angiography;  Surgeon: Ezra GORMAN Shuck, MD;  Location: [facility];  Service: Cardiovascular;   Laterality: N/A;   CARDIAC CATHETERIZATION  1960   VSD was so small; didn't need repaired   COLECTOMY, SIGMOID, ROBOT-ASSISTED N/A 11/30/2023   Procedure: COLECTOMY, SIGMOID, ROBOT-ASSISTED ROBOTIC LOW ANTERIOR RECTOSIGMOID RESECTION (LAR) WITH ANASTOMOSIS COLOVAGINAL FISTULA TAKEDOWN & REPAIR OMENTOPEXY OF VAGINA INTRAOPERATIVE ASSESSMENT OF TISSUE VASCULAR PERFUSION USING ICG (indocyanine green ) IMMUNOFLUORESCENCE TRANSVERSUS ABDOMINIS PLANE (TAP) BLOCK - BILATERAL;  Surgeon: Sheldon Standing, MD;  Location: [facility];  Service: General;  Late   CYSTOSCOPY WITH INDOCYANINE GREEN  IMAGING (ICG) N/A 11/30/2023   Procedure: Cystoscopy with bilateral ureteral FireFly injections Transurethral resection of bladder tumor--large;  Surgeon: Carolee Sherwood JONETTA DOUGLAS, MD;  Location: [facility];  Service: Urology;  Laterality: N/A;   EXAM UNDER ANESTHESIA WITH MANIPULATION OF HIP Right 06/02/2014   dr sheril   FLEXIBLE SIGMOIDOSCOPY N/A 11/30/2023   Procedure: KINGSTON SIDE;  Surgeon: Sheldon Standing, MD;  Location: [facility];  Service: General;  Laterality: N/A;   FRACTURE SURGERY     HERNIA REPAIR     HIP CLOSED REDUCTION Right 06/02/2014   Procedure: CLOSED MANIPULATION HIP;  Surgeon: Maude KANDICE sheril, MD;  Location: [facility];  Service: Orthopedics;  Laterality: Right;   IR FLUORO GUIDED NEEDLE PLC ASPIRATION/INJECTION LOC  08/09/2023  JOINT REPLACEMENT     JOINT REPLACEMENT     POSTERIOR LUMBAR FUSION 4 LEVEL N/A 06/26/2022   Procedure: Lumbar One To Lumbar Five Posterior Instrumented Fusion;  Surgeon: Cheryle Debby LABOR, MD;  Location: [facility] OR;  Service: Neurosurgery;  Laterality: N/A;   REFRACTIVE SURGERY Bilateral    SHOULDER ARTHROSCOPY Right    SHOULDER OPEN ROTATOR CUFF REPAIR Right    SPINAL FUSION  1996   t10 down to my coccyx   SPINE HARDWARE REMOVAL     TOTAL ABDOMINAL HYSTERECTOMY     TOTAL HIP ARTHROPLASTY Right 05/10/2014   [place]      by dr lonni olcott   TOTAL KNEE ARTHROPLASTY  Left    TOTAL SHOULDER ARTHROPLASTY Left 08/19/2016   Procedure: TOTAL SHOULDER ARTHROPLASTY;  Surgeon: Eva Herring, MD;  Location: [facility] OR;  Service: Orthopedics;  Laterality: Left;  Left total shoulder replacement   Patient Active Problem List   Diagnosis Date Noted   Colovaginal fistula 11/30/2023   Diskitis 10/04/2023   Hypotension 09/06/2023   Fracture of tenth thoracic vertebra (HCC) 09/05/2023   History of discitis 09/05/2023   Senile osteoporosis 09/05/2023   History of urinary retention 09/05/2023   Vaginal atrophy 09/05/2023   Nocturia 09/05/2023   Vulvar ulcer 09/05/2023   Pulmonary nodule 08/02/2023   Non-traumatic compression fracture of vertebral column (HCC) 06/24/2023   Nonobstructive atherosclerosis of coronary artery 02/10/2023   Bilateral pseudophakia 02/03/2023   Vertebral fracture, osteoporotic (HCC) 01/06/2023   Ventriculitis of brain due to bacteria 11/02/2022   Anemia of chronic illness 10/12/2022   Bipolar disorder, in partial remission, most recent episode depressed (HCC) 10/07/2022   Adjustment disorder with mixed anxiety and depressed mood 09/30/2022   Chronic wound 09/25/2022   Hyperglycemia 09/25/2022   Chronic midline thoracic back pain 09/17/2022   Primary insomnia 09/17/2022   Chronic combined systolic and diastolic CHF (congestive heart failure) (HCC) 09/14/2022   Acute blood loss anemia 09/09/2022   Infection of lumbar spine (HCC) 09/03/2022   Malnutrition of moderate degree 08/20/2022   Hardware complicating wound infection (HCC) 08/20/2022   Infection of deep incisional surgical site after procedure 08/20/2022   Aspiration pneumonia of both lungs (HCC) 08/19/2022   Acute respiratory failure with hypoxia (HCC) 08/19/2022   Sepsis (HCC) 08/19/2022   Seizure (HCC) 08/19/2022   Wound dehiscence 08/09/2022   Delayed surgical wound healing 07/08/2022   Fracture of lumbar spine without cord injury (HCC) 06/26/2022   Lumbar vertebral fracture  (HCC) 06/25/2022   Mood disorder (HCC) 09/29/2021   Generalized muscle weakness 04/30/2019   Mild cognitive impairment 09/06/2018   Hyponatremia 02/28/2018   S/P shoulder replacement, left 08/19/2016   Anxiety 10/17/2014   Acid reflux 10/17/2014   BP (high blood pressure) 10/17/2014   Arthritis, degenerative 10/17/2014   Adult hypothyroidism 10/17/2014   UTI (urinary tract infection) 06/03/2014   Fracture of bone adjacent to prosthesis 06/03/2014   Peri-prosthetic fracture of femur following total hip arthroplasty 06/02/2014   Urinary retention 05/13/2014   Chronic pain 05/10/2014   History of hip surgery 05/10/2014   Osteoarthritis 09/30/2011   UNSPECIFIED HEART FAILURE 06/24/2010   UNSPECIFIED CONGENITAL DEFECT OF SEPTAL CLOSURE 06/24/2010   Secondary cardiomyopathy (HCC) 03/18/2010   Tobacco use 03/18/2010   DM 06/13/2009   HYPERTENSION, UNSPECIFIED 06/13/2009   VENTRICULAR TACHYCARDIA 06/13/2009   VENTRICULAR SEPTAL DEFECT, CONGENITAL 06/13/2009   Essential (primary) hypertension 06/13/2009   Diabetes mellitus type 2, noninsulin dependent (HCC) 06/13/2009    ONSET DATE:  01/05/2024 MD referral  REFERRING DIAG: R26.9 (ICD-10-CM) - Gait difficulty   THERAPY DIAG:  Muscle weakness (generalized)  Unsteadiness on feet  Other abnormalities of gait and mobility  Rationale for Evaluation and Treatment: Rehabilitation  SUBJECTIVE:                                                                                                                                                                                             SUBJECTIVE STATEMENT: [place], stood up from a diagonal position, felt like something popped.  The hip hurts when I move it in and out of the bed/chair.  Did see Dr. Daldorf immediately and they did an x-ray, which was normal compared to [facility] x-ray for the hip.  They may have to do an MRI, based on if the pain  resolves.  Pt accompanied by: self  PERTINENT HISTORY: Chronic osteomyelitis with worsening back pain; Hx of multiple R hip fractures with surgery to revisions, significant leg length difference with LLE longer than RLE, hx of infection post surgery in spine (treated on antibiotics) Per MD Note 01/09/2024:    Patient was seen by Dr. Elspeth Schultze who scheduled her for surgery and on  12/01/2023 she underwent   -ROBOTIC LOW ANTERIOR RECTOSIGMOID RESECTION (LAR) WITH ANASTOMOSIS -COLOVAGINAL FISTULA TAKEDOWN & REPAIR -OMENTOPEXY OF VAGINA -INTRAOPERATIVE ASSESSMENT OF TISSUE VASCULAR PERFUSION USING ICG (indocyanine green ) IMMUNOFLUORESCENCE -TRANSVERSUS ABDOMINIS PLANE (TAP) BLOCK - BILATERAL -FLEXIBLE SIGMOIDOSCOPY    Urology did joint surgery and did    1.  Cystoscopy with bilateral ureteral FireFly injections 2.  Transurethral resection of  possible bladder tumor--large   Pathology came back with:   A. BLADDER TUMOR, TRANSURETHERAL RESECTON:  Florid cystitis cystica and papillary cystitis  Negative for carcinoma   B. RECTOSIGMOID COLON RESECTION:  Diverticula with chronic inflammation, perforation, fistula and reactive  fibrosis  Negative for carcinoma  Five benign lymph nodes (0/5)   C. FINAL DISTAL MARGIN:  Benign colonic tissue         PAIN:  Are you having pain? Yes: NPRS scale: 4-5/10 Pain location: R hip/back Pain description: throbbing, grinding, sharp Aggravating factors: more activities, [place] Relieving factors: rest  PRECAUTIONS: Fall and Other: Recent colectomy-pt reports no restrictions Pt has history of osteoporosis and compression fractures RED FLAGS: None   WEIGHT BEARING RESTRICTIONS: No  FALLS: Has patient fallen in last 6 months? No  LIVING ENVIRONMENT: Lives with: lives with their family Lives in: House/apartment Stairs: long ramp in/out of home Has following equipment at home: Vannie - 2 wheeled  and Wheelchair  (manual)  PLOF: Needs assistance with gait and Vocation/Vocational requirements: retired Runner, broadcasting/film/video, Tree surgeon; enjoys spending time with grandchildren in [place] and goes to [employer] weekly  PATIENT GOALS: To get stronger, to get back on a cane.  To use walker to get to the car (long ramp), get RLE into car better  OBJECTIVE:    TODAY'S TREATMENT: 03/08/2024 Activity Comments  -Seated LAQ 3 x 10 -Seated march, 3 x 10, no weight RLE, 3# LLE -seated hamstring strengthening, 3 x 10  3#, seated in locked w/c    Green band  Seated ankle dorsiflexion, 3 x 10 Resisted with green theraband  Upper body strenghtening: Shoulder flexion 5 reps Elbow flexion x 6 reps Elbow extension x 6 reps   2#, performed one arm at a time  Gait x 50 ft with RW Cues to try to lessen UE support through RW            PATIENT EDUCATION: Education details: Continueing to progress towards LTGs; answered pt's questions about aquatic therapy and encouraged pt to give it a try Person educated: Patient Education method: Explanation, Demonstration, Tactile cues, Verbal cues, and Handouts Education comprehension: verbalized understanding and returned demonstration    Access Code: [email or web address] URL: [email or web address] Date: 02/02/2024 Prepared by: [facility] - Outpatient  Rehab - [facility]  Program Notes Seated leg slides out and in;  have a pillow case under your right foot-slide your foot out and in, 3 x 10 reps.  You can have a 3-5# ankle weight on your ankle  Exercises - Seated Hip Adduction Isometrics with Ball  - 1 x daily - 7 x weekly - 3 sets - 10 reps - Seated Hip Abduction with Resistance  - 1 x daily - 7 x weekly - 3 sets - 10 reps - Sit to Stand with Resistance Around Legs  - 1 x daily - 7 x weekly - 3 sets - 5 reps - Seated Shoulder Row with Anchored Resistance  - 1 x daily - 5 x weekly - 2 sets - 10 reps - Seated Anti-Rotation Press With Anchored Resistance  - 1 x daily - 7 x  weekly - 3 sets - 10 reps    --------------------------------------------------- Note: Objective measures were completed at Evaluation unless otherwise noted.  DIAGNOSTIC FINDINGS: MRI 09/2023   IMPRESSION: 1. Postoperative changes of T11-S1 laminectomy and posterior spinal fusion with right posterior spinal instrumentation from L2-L5. On prior CT, there is evidence of fracture through the left posterior fusion mass at L4-5, correlating with edema is seen in the left pedicle of L4 on today's study. 2. Decreased edema within the L4 and L5 vertebral bodies and posterior elements, as well as within the L4-5 and L3-4 discs, likely reactive and related to the above fracture. 3. Loculated simple fluid within the thecal sac with some marginalized cauda equina nerve roots, most consistent with arachnoiditis. 4. No spinal canal stenosis or neural foraminal narrowing.  COGNITION: Overall cognitive status: History of cognitive impairments - at baseline   SENSATION: Light touch: Impaired  and to light touch-see below Reports some numbness L foot  POSTURE: Leg length diffierence  LOWER EXTREMITY ROM:     Active  Right Eval Left Eval  Hip flexion    Hip extension    Hip abduction    Hip adduction    Hip internal rotation    Hip external rotation    Knee flexion    Knee extension    Ankle dorsiflexion  Ankle plantarflexion    Ankle inversion    Ankle eversion     (Blank rows = not tested)  LOWER EXTREMITY MMT:    MMT Right Eval Left Eval  Hip flexion 3- 3+  Hip extension    Hip abduction 4 4  Hip adduction 4 4  Hip internal rotation    Hip external rotation    Knee flexion 4 4  Knee extension 3+ 4  Ankle dorsiflexion 3+ 3+  Ankle plantarflexion    Ankle inversion    Ankle eversion    (Blank rows = not tested)   TRANSFERS: Sit to stand: SBA  Assistive device utilized: Environmental consultant - 2 wheeled     Stand to sit: SBA  Assistive device utilized: Environmental consultant - 2 wheeled       GAIT: Findings: Gait Characteristics: excess lateral weightshift through trunk due to significant leg length difference and step through pattern, Distance walked: 50 ft, Assistive device utilized:Walker - 2 wheeled, Level of assistance: CGA, and Comments: NA  FUNCTIONAL TESTS:  5 times sit to stand: 14.44 sec with BUE support Timed up and go (TUG): 24.53 sec 2 minute walk test: NT 10 meter walk test: 21.34 sec (1.54 ft/sec) TUG cognitive: (naming colors) 28.62 sec 38M walk back:  42.16 sec with RW Stroop gait:  13.25 sec in 10 ft (naming color words)-0.75 ft/sec   18.75 sec in 10 feet (naming printed colors)-0.53 ft/sec                                                                                                                               TREATMENT DATE: 01/26/2024    PATIENT EDUCATION: Education details: Eval results, POC Person educated: Patient Education method: Explanation Education comprehension: verbalized understanding  HOME EXERCISE PROGRAM: Not yet initiated Did perform eccentric hip flexion/extension in sitting  GOALS: Goals reviewed with patient? Yes  SHORT TERM GOALS: Target date: 02/24/2024  Pt will be independent with HEP for improved strength, balance, gait. Baseline: printed out and reviewed 02/23/24 Goal status: MET 02/23/24  2.  Pt will improve TUG score to less than or equal to 18 sec for decreased fall risk. Baseline: 24.53 sec; 14.54 sec with 4WW 02/23/24 Goal status: MET 02/23/24  3.  Pt will improve 3 M Walk backwards to less than or equal to 30 seconds for decreased fall risk. Baseline: 42.16 sec; 9.96 sec 02/23/24 Goal status: MET 02/23/24  4.  Pt will report at least 50% improvement in car transfers.  Baseline:  difficulty with RLE; Patient reports that she has been able to swing her LEs into the car by herself- reports 50% improvement. 02/23/24  Goal status:  MET 02/23/24  LONG TERM GOALS: Target date: 04/19/2024  Pt will be independent with HEP for  improved strength, balance, gait. Baseline:  Goal status: IN PROGRESS  2.  Pt will improve gait velocity to at least 1.8 ft/sec for improved gait efficiency and safety. Baseline: 1.54  ft/sec Goal status: IN PROGRESS  3.  Pt will improve gait velocity with dual task to at least 1 ft/sec for improved gait and decreased fall risk. Baseline: 0.53-0.75 ft/sec with Stroop over 10 ft Goal status: IN PROGRESS  4.  Pt will improve 5x sit<>stand to less than or equal to 11.5 sec with light UE support to demonstrate improved functional strength and transfer efficiency. Baseline: 14.44 sec Goal status: IN PROGRESS  5.  Pt will verbalize plans for continued community fitness upon d/c from PT to maximize gains made in PT. Baseline:  Goal status: IN PROGRESS  6.  Pt will ambulate at least 200 ft in 2 minute walk, for improved gait efficiency and endurance.  Baseline: 100 ft in 1:30  Goal status:  IN PROGRESS    ASSESSMENT:  CLINICAL IMPRESSION: Pt presents today with reports of having increased R hip pain since standing up from an awkward leg position a little over a week ago.  She has seen MD who had CT done; no changes noted compared to previous CT of R hip.  She is most painful with lifting RLE into and out of bed, so we mostly worked on seated leg strengthening as well as upper body/postural strengthening.  She tolerates well without c/o increased pain at end of session. Pt will continue to benefit from skilled PT towards goals for improved functional mobility and decreased fall risk.   OBJECTIVE IMPAIRMENTS: Abnormal gait, decreased balance, decreased mobility, difficulty walking, decreased strength, impaired sensation, postural dysfunction, and pain.   ACTIVITY LIMITATIONS: sitting, standing, transfers, locomotion level, and caring for others  PARTICIPATION LIMITATIONS: driving, shopping, community activity, and travel  PERSONAL FACTORS: 3+ comorbidities: see PMH above are also affecting  patient's functional outcome.   REHAB POTENTIAL: Good  CLINICAL DECISION MAKING: Evolving/moderate complexity  EVALUATION COMPLEXITY: Moderate  PLAN:  PT FREQUENCY: 2x/week  PT DURATION: 8 weeks plus eval visit  PLANNED INTERVENTIONS: [id number]- PT Re-evaluation, 97750- Physical Performance Testing, 97110-Therapeutic exercises, 97530- Therapeutic activity, [id number]- Neuromuscular re-education, 97535- Self Care, [id number]- Manual therapy, [id number]- Gait training, [phone]- Canalith repositioning, [id number]- Aquatic Therapy, Patient/Family education, and Balance training  PLAN FOR NEXT SESSION: 10th Visit PN; Continue BLE strengthening.  Ask about back pain and progress standing and gait tolerance; RLE strength for car transfers; pt requests not to perform Nustep and physioball activities d/t hip pain; pt reports that she would like to work towards being able to walk down her ramp and into her car (reports currently limited by endurance and balance)   AQUATICS: Frequency: 1x/week Duration: 4 weeks  Special Instruction: chronic hip and back pain, imbalance     Greig Anon, PT 03/08/24 3:39 PM Phone: [phone] Fax: [phone]   [facility] at [facility] [address] [place], [place] [address] Phone # [phone] Fax # [phone]

## 2024-03-08 NOTE — Telephone Encounter (Signed)
 Per Dr. Fleeta Rothman: Can we let [name] know while she has a bunch of fractures there are no NEW signs of infection   Spoke with [name], relayed that imaging showed fractures, but that there were no signs of any new infection. Patient verbalized understanding and has no further questions.   [name], BSN, RN

## 2024-03-13 ENCOUNTER — Encounter: Payer: Self-pay | Admitting: Physical Therapy

## 2024-03-13 ENCOUNTER — Ambulatory Visit: Admitting: Physical Therapy

## 2024-03-13 DIAGNOSIS — R2681 Unsteadiness on feet: Secondary | ICD-10-CM

## 2024-03-13 DIAGNOSIS — M6281 Muscle weakness (generalized): Secondary | ICD-10-CM

## 2024-03-13 DIAGNOSIS — R2689 Other abnormalities of gait and mobility: Secondary | ICD-10-CM

## 2024-03-13 NOTE — Therapy (Signed)
 OUTPATIENT PHYSICAL THERAPY NEURO TREATMENT NOTE/10th VISIT PROGRESS NOTE   Patient Name: [name] MRN: [id number] DOB:1956-06-18, 68 y.o., female Today's Date: 03/13/2024   PCP: Rolinda Millman, MD  REFERRING PROVIDER: Urbano Albright, MD     Progress Note Reporting Period 01/26/2024 to 03/13/2024  See note below for Objective Data and Assessment of Progress/Goals.     END OF SESSION:  PT End of Session - 03/13/24 1322     Visit Number 10    Number of Visits 17    Date for PT Re-Evaluation 04/19/24    Authorization Type [employer] Medicare    Progress Note Due on Visit 10    PT Start Time 1320    PT Stop Time 1400    PT Time Calculation (min) 40 min    Equipment Utilized During Treatment Gait belt    Activity Tolerance Patient tolerated treatment well;Patient limited by pain    Behavior During Therapy WFL for tasks assessed/performed            Past Medical History:  Diagnosis Date   Anemia    Anxiety    Arthritis    Bipolar disorder (HCC)    Cardiomyopathy (HCC)    Complication of anesthesia    Awake but could not move   Depression    Diskitis 10/04/2023   GERD (gastroesophageal reflux disease)    Heart failure (HCC)    Acute   Heart murmur    related to VSD   High cholesterol    History of blood transfusion    related to OR (08/19/2016)   History of hiatal hernia    Hypertension    Hyperthyroidism    Hypotension 09/06/2023   Mild cognitive impairment 09/06/2018   Paroxysmal ventricular tachycardia (HCC)    Pneumonia    Rectovaginal fistula 09/06/2023   Tachycardia, unspecified    Type II diabetes mellitus (HCC)    UTI (urinary tract infection)    being treated with Keflex    Ventricular septal defect    Ventriculitis of brain due to bacteria 11/02/2022   Vertebral fracture, osteoporotic (HCC) 01/06/2023   Past Surgical History:  Procedure Laterality Date   ABDOMINAL HYSTERECTOMY     BACK SURGERY     CARDIAC CATHETERIZATION N/A  06/23/2015   Procedure: Left Heart Cath and Coronary Angiography;  Surgeon: Ezra GORMAN Shuck, MD;  Location: [facility] INVASIVE CV LAB;  Service: Cardiovascular;  Laterality: N/A;   CARDIAC CATHETERIZATION  1960   VSD was so small; didn't need repaired   COLECTOMY, SIGMOID, ROBOT-ASSISTED N/A 11/30/2023   Procedure: COLECTOMY, SIGMOID, ROBOT-ASSISTED ROBOTIC LOW ANTERIOR RECTOSIGMOID RESECTION (LAR) WITH ANASTOMOSIS COLOVAGINAL FISTULA TAKEDOWN & REPAIR OMENTOPEXY OF VAGINA INTRAOPERATIVE ASSESSMENT OF TISSUE VASCULAR PERFUSION USING ICG (indocyanine green ) IMMUNOFLUORESCENCE TRANSVERSUS ABDOMINIS PLANE (TAP) BLOCK - BILATERAL;  Surgeon: Sheldon Standing, MD;  Location: [facility]S;  Service: General;  Late   CYSTOSCOPY WITH INDOCYANINE GREEN  IMAGING (ICG) N/A 11/30/2023   Procedure: Cystoscopy with bilateral ureteral FireFly injections Transurethral resection of bladder tumor--large;  Surgeon: Carolee Sherwood JONETTA DOUGLAS, MD;  Location: [facility];  Service: Urology;  Laterality: N/A;   EXAM UNDER ANESTHESIA WITH MANIPULATION OF HIP Right 06/02/2014   dr sheril   FLEXIBLE SIGMOIDOSCOPY N/A 11/30/2023   Procedure: KINGSTON SIDE;  Surgeon: Sheldon Standing, MD;  Location: [facility];  Service: General;  Laterality: N/A;   FRACTURE SURGERY     HERNIA REPAIR     HIP CLOSED REDUCTION Right 06/02/2014   Procedure: CLOSED MANIPULATION HIP;  Surgeon:  Maude KANDICE Herald, MD;  Location: [facility] OR;  Service: Orthopedics;  Laterality: Right;   IR FLUORO GUIDED NEEDLE PLC ASPIRATION/INJECTION LOC  08/09/2023   JOINT REPLACEMENT     JOINT REPLACEMENT     POSTERIOR LUMBAR FUSION 4 LEVEL N/A 06/26/2022   Procedure: Lumbar One To Lumbar Five Posterior Instrumented Fusion;  Surgeon: Cheryle Debby LABOR, MD;  Location: [facility] OR;  Service: Neurosurgery;  Laterality: N/A;   REFRACTIVE SURGERY Bilateral    SHOULDER ARTHROSCOPY Right    SHOULDER OPEN ROTATOR CUFF REPAIR Right    SPINAL FUSION  1996   t10 down to my coccyx   SPINE HARDWARE  REMOVAL     TOTAL ABDOMINAL HYSTERECTOMY     TOTAL HIP ARTHROPLASTY Right 05/10/2014   [place]      by dr lonni olcott   TOTAL KNEE ARTHROPLASTY Left    TOTAL SHOULDER ARTHROPLASTY Left 08/19/2016   Procedure: TOTAL SHOULDER ARTHROPLASTY;  Surgeon: Eva Herring, MD;  Location: [facility] OR;  Service: Orthopedics;  Laterality: Left;  Left total shoulder replacement   Patient Active Problem List   Diagnosis Date Noted   Colovaginal fistula 11/30/2023   Diskitis 10/04/2023   Hypotension 09/06/2023   Fracture of tenth thoracic vertebra (HCC) 09/05/2023   History of discitis 09/05/2023   Senile osteoporosis 09/05/2023   History of urinary retention 09/05/2023   Vaginal atrophy 09/05/2023   Nocturia 09/05/2023   Vulvar ulcer 09/05/2023   Pulmonary nodule 08/02/2023   Non-traumatic compression fracture of vertebral column (HCC) 06/24/2023   Nonobstructive atherosclerosis of coronary artery 02/10/2023   Bilateral pseudophakia 02/03/2023   Vertebral fracture, osteoporotic (HCC) 01/06/2023   Ventriculitis of brain due to bacteria 11/02/2022   Anemia of chronic illness 10/12/2022   Bipolar disorder, in partial remission, most recent episode depressed (HCC) 10/07/2022   Adjustment disorder with mixed anxiety and depressed mood 09/30/2022   Chronic wound 09/25/2022   Hyperglycemia 09/25/2022   Chronic midline thoracic back pain 09/17/2022   Primary insomnia 09/17/2022   Chronic combined systolic and diastolic CHF (congestive heart failure) (HCC) 09/14/2022   Acute blood loss anemia 09/09/2022   Infection of lumbar spine (HCC) 09/03/2022   Malnutrition of moderate degree 08/20/2022   Hardware complicating wound infection (HCC) 08/20/2022   Infection of deep incisional surgical site after procedure 08/20/2022   Aspiration pneumonia of both lungs (HCC) 08/19/2022   Acute respiratory failure with hypoxia (HCC) 08/19/2022   Sepsis (HCC) 08/19/2022   Seizure (HCC) 08/19/2022   Wound  dehiscence 08/09/2022   Delayed surgical wound healing 07/08/2022   Fracture of lumbar spine without cord injury (HCC) 06/26/2022   Lumbar vertebral fracture (HCC) 06/25/2022   Mood disorder (HCC) 09/29/2021   Generalized muscle weakness 04/30/2019   Mild cognitive impairment 09/06/2018   Hyponatremia 02/28/2018   S/P shoulder replacement, left 08/19/2016   Anxiety 10/17/2014   Acid reflux 10/17/2014   BP (high blood pressure) 10/17/2014   Arthritis, degenerative 10/17/2014   Adult hypothyroidism 10/17/2014   UTI (urinary tract infection) 06/03/2014   Fracture of bone adjacent to prosthesis 06/03/2014   Peri-prosthetic fracture of femur following total hip arthroplasty 06/02/2014   Urinary retention 05/13/2014   Chronic pain 05/10/2014   History of hip surgery 05/10/2014   Osteoarthritis 09/30/2011   UNSPECIFIED HEART FAILURE 06/24/2010   UNSPECIFIED CONGENITAL DEFECT OF SEPTAL CLOSURE 06/24/2010   Secondary cardiomyopathy (HCC) 03/18/2010   Tobacco use 03/18/2010   DM 06/13/2009   HYPERTENSION, UNSPECIFIED 06/13/2009   VENTRICULAR TACHYCARDIA 06/13/2009  VENTRICULAR SEPTAL DEFECT, CONGENITAL 06/13/2009   Essential (primary) hypertension 06/13/2009   Diabetes mellitus type 2, noninsulin dependent (HCC) 06/13/2009    ONSET DATE: 01/05/2024 MD referral  REFERRING DIAG: R26.9 (ICD-10-CM) - Gait difficulty   THERAPY DIAG:  Muscle weakness (generalized)  Unsteadiness on feet  Other abnormalities of gait and mobility  Rationale for Evaluation and Treatment: Rehabilitation  SUBJECTIVE:                                                                                                                                                                                             SUBJECTIVE STATEMENT: Did some of the exercises over the weekend and I feel better than last visit.  Got results from CT that while there are multiple fractures in back, there is no new infection  No new  restrictions per pt report Pt accompanied by: self  PERTINENT HISTORY: Chronic osteomyelitis with worsening back pain; Hx of multiple R hip fractures with surgery to revisions, significant leg length difference with LLE longer than RLE, hx of infection post surgery in spine (treated on antibiotics) Per MD Note 01/09/2024:    Patient was seen by Dr. Elspeth Schultze who scheduled her for surgery and on  12/01/2023 she underwent   -ROBOTIC LOW ANTERIOR RECTOSIGMOID RESECTION (LAR) WITH ANASTOMOSIS -COLOVAGINAL FISTULA TAKEDOWN & REPAIR -OMENTOPEXY OF VAGINA -INTRAOPERATIVE ASSESSMENT OF TISSUE VASCULAR PERFUSION USING ICG (indocyanine green ) IMMUNOFLUORESCENCE -TRANSVERSUS ABDOMINIS PLANE (TAP) BLOCK - BILATERAL -FLEXIBLE SIGMOIDOSCOPY    Urology did joint surgery and did    1.  Cystoscopy with bilateral ureteral FireFly injections 2.  Transurethral resection of  possible bladder tumor--large   Pathology came back with:   A. BLADDER TUMOR, TRANSURETHERAL RESECTON:  Florid cystitis cystica and papillary cystitis  Negative for carcinoma   B. RECTOSIGMOID COLON RESECTION:  Diverticula with chronic inflammation, perforation, fistula and reactive  fibrosis  Negative for carcinoma  Five benign lymph nodes (0/5)   C. FINAL DISTAL MARGIN:  Benign colonic tissue         PAIN:  Are you having pain? Yes: NPRS scale: 4-5/10 Pain location: R hip/back Pain description: throbbing, grinding, sharp Aggravating factors: more activities, [place] Relieving factors: rest  PRECAUTIONS: Fall and Other: Recent colectomy-pt reports no restrictions Pt has history of osteoporosis and compression fractures RED FLAGS: None   WEIGHT BEARING RESTRICTIONS: No  FALLS: Has patient fallen in last 6 months? No  LIVING ENVIRONMENT: Lives with: lives with their family Lives in: House/apartment Stairs: long ramp in/out of home Has following equipment at home: Vannie - 2 wheeled and  Wheelchair (manual)  PLOF: Needs assistance with gait and Vocation/Vocational requirements: retired Runner, broadcasting/film/video, Tree surgeon; enjoys spending time with grandchildren in [place] and goes to [employer] weekly  PATIENT GOALS: To get stronger, to get back on a cane.  To use walker to get to the car (long ramp), get RLE into car better  OBJECTIVE:    TODAY'S TREATMENT: 03/13/2024 Activity Comments  Sit to stand from chair  5 reps 19.71 sec with UE support  LAQ 10 reps, 3# LLE Seated march x 10, 3# LLE Hamstring curl, green band x10 Heel raises 10 reps    Gait velocity= 17.82 sec Gait 100 ft with RW 1.84 f/tsec  Repeated the above interval, 2 additional reps 2 min and 3 min break in between To address stamina, endurance  (Placed tennis balls on RW)             PATIENT EDUCATION: Education details: Educated patient in use of interval training with activities (similar to today's session) at home to work on increasing endurance, stamina Person educated: Patient Education method: Explanation, Demonstration, Actor cues, Verbal cues, and Handouts Education comprehension: verbalized understanding and returned demonstration    Access Code: [email or web address] URL: [email or web address] Date: 02/02/2024 Prepared by: [facility] - Outpatient  Rehab - [facility]  Program Notes Seated leg slides out and in;  have a pillow case under your right foot-slide your foot out and in, 3 x 10 reps.  You can have a 3-5# ankle weight on your ankle  Exercises - Seated Hip Adduction Isometrics with Ball  - 1 x daily - 7 x weekly - 3 sets - 10 reps - Seated Hip Abduction with Resistance  - 1 x daily - 7 x weekly - 3 sets - 10 reps - Sit to Stand with Resistance Around Legs  - 1 x daily - 7 x weekly - 3 sets - 5 reps - Seated Shoulder Row with Anchored Resistance  - 1 x daily - 5 x weekly - 2 sets - 10 reps - Seated Anti-Rotation Press With Anchored Resistance  - 1 x daily - 7 x weekly - 3 sets - 10  reps    --------------------------------------------------- Note: Objective measures were completed at Evaluation unless otherwise noted.  DIAGNOSTIC FINDINGS: MRI 09/2023   IMPRESSION: 1. Postoperative changes of T11-S1 laminectomy and posterior spinal fusion with right posterior spinal instrumentation from L2-L5. On prior CT, there is evidence of fracture through the left posterior fusion mass at L4-5, correlating with edema is seen in the left pedicle of L4 on today's study. 2. Decreased edema within the L4 and L5 vertebral bodies and posterior elements, as well as within the L4-5 and L3-4 discs, likely reactive and related to the above fracture. 3. Loculated simple fluid within the thecal sac with some marginalized cauda equina nerve roots, most consistent with arachnoiditis. 4. No spinal canal stenosis or neural foraminal narrowing.  COGNITION: Overall cognitive status: History of cognitive impairments - at baseline   SENSATION: Light touch: Impaired  and to light touch-see below Reports some numbness L foot  POSTURE: Leg length diffierence  LOWER EXTREMITY ROM:     Active  Right Eval Left Eval  Hip flexion    Hip extension    Hip abduction    Hip adduction    Hip internal rotation    Hip external rotation    Knee flexion    Knee extension    Ankle dorsiflexion    Ankle plantarflexion    Ankle inversion    Ankle  eversion     (Blank rows = not tested)  LOWER EXTREMITY MMT:    MMT Right Eval Left Eval  Hip flexion 3- 3+  Hip extension    Hip abduction 4 4  Hip adduction 4 4  Hip internal rotation    Hip external rotation    Knee flexion 4 4  Knee extension 3+ 4  Ankle dorsiflexion 3+ 3+  Ankle plantarflexion    Ankle inversion    Ankle eversion    (Blank rows = not tested)   TRANSFERS: Sit to stand: SBA  Assistive device utilized: Environmental consultant - 2 wheeled     Stand to sit: SBA  Assistive device utilized: Environmental consultant - 2 wheeled      GAIT: Findings:  Gait Characteristics: excess lateral weightshift through trunk due to significant leg length difference and step through pattern, Distance walked: 50 ft, Assistive device utilized:Walker - 2 wheeled, Level of assistance: CGA, and Comments: NA  FUNCTIONAL TESTS:  5 times sit to stand: 14.44 sec with BUE support Timed up and go (TUG): 24.53 sec 2 minute walk test: NT 10 meter walk test: 21.34 sec (1.54 ft/sec) TUG cognitive: (naming colors) 28.62 sec 7M walk back:  42.16 sec with RW Stroop gait:  13.25 sec in 10 ft (naming color words)-0.75 ft/sec   18.75 sec in 10 feet (naming printed colors)-0.53 ft/sec                                                                                                                               TREATMENT DATE: 01/26/2024    PATIENT EDUCATION: Education details: Eval results, POC Person educated: Patient Education method: Explanation Education comprehension: verbalized understanding  HOME EXERCISE PROGRAM: Not yet initiated Did perform eccentric hip flexion/extension in sitting  GOALS: Goals reviewed with patient? Yes  SHORT TERM GOALS: Target date: 02/24/2024  Pt will be independent with HEP for improved strength, balance, gait. Baseline: printed out and reviewed 02/23/24 Goal status: MET 02/23/24  2.  Pt will improve TUG score to less than or equal to 18 sec for decreased fall risk. Baseline: 24.53 sec; 14.54 sec with 4WW 02/23/24 Goal status: MET 02/23/24  3.  Pt will improve 3 M Walk backwards to less than or equal to 30 seconds for decreased fall risk. Baseline: 42.16 sec; 9.96 sec 02/23/24 Goal status: MET 02/23/24  4.  Pt will report at least 50% improvement in car transfers.  Baseline:  difficulty with RLE; Patient reports that she has been able to swing her LEs into the car by herself- reports 50% improvement. 02/23/24  Goal status:  MET 02/23/24  LONG TERM GOALS: Target date: 04/19/2024  Pt will be independent with HEP for improved strength,  balance, gait. Baseline:  Goal status: IN PROGRESS  2.  Pt will improve gait velocity to at least 1.8 ft/sec for improved gait efficiency and safety. Baseline: 1.54 ft/sec Goal status: IN PROGRESS  3.  Pt will improve  gait velocity with dual task to at least 1 ft/sec for improved gait and decreased fall risk. Baseline: 0.53-0.75 ft/sec with Stroop over 10 ft Goal status: IN PROGRESS  4.  Pt will improve 5x sit<>stand to less than or equal to 11.5 sec with light UE support to demonstrate improved functional strength and transfer efficiency. Baseline: 14.44 sec Goal status: IN PROGRESS  5.  Pt will verbalize plans for continued community fitness upon d/c from PT to maximize gains made in PT. Baseline:  Goal status: IN PROGRESS  6.  Pt will ambulate at least 200 ft in 2 minute walk, for improved gait efficiency and endurance.  Baseline: 100 ft in 1:30  Goal status:  IN PROGRESS    ASSESSMENT:  CLINICAL IMPRESSION: 10th Visit Progress Note:  Pt presents today and reports no new infection in lumbar spine, per CT scan.  She reports having some muscle soreness after last session, but overall reporting she is trying to do more standing and walking outside of therapy.  Objective measures:  FTSTS:  19.4 sec with UE support (time increased from eval); gait velocity:  1.84 ft/sec (improved from 1.54 ft/sec at eval).  Skilled PT session focused on circuit/interval training with 5x sit<>stand, seated leg strengthening, and 143f t of gait, performed 3 sets.  Pt has 2-3 min time of rest between, when PT placed tennis balls on walker and educated in trying this interval type training with activities at home.  At end of session, she reports hip pain at 6/10 and fatigue, no other complaints.  She is to have aquatic therapy later this week, to address strengthening, imbalance.  She will continue to benefit from skilled PT towards goals for improved functional mobility and decreased fall risk.   OBJECTIVE  IMPAIRMENTS: Abnormal gait, decreased balance, decreased mobility, difficulty walking, decreased strength, impaired sensation, postural dysfunction, and pain.   ACTIVITY LIMITATIONS: sitting, standing, transfers, locomotion level, and caring for others  PARTICIPATION LIMITATIONS: driving, shopping, community activity, and travel  PERSONAL FACTORS: 3+ comorbidities: see PMH above are also affecting patient's functional outcome.   REHAB POTENTIAL: Good  CLINICAL DECISION MAKING: Evolving/moderate complexity  EVALUATION COMPLEXITY: Moderate  PLAN:  PT FREQUENCY: 2x/week  PT DURATION: 8 weeks plus eval visit  PLANNED INTERVENTIONS: [id number]- PT Re-evaluation, 97750- Physical Performance Testing, 97110-Therapeutic exercises, 97530- Therapeutic activity, [id number]- Neuromuscular re-education, 97535- Self Care, [id number]- Manual therapy, [id number]- Gait training, [phone]- Canalith repositioning, [id number]- Aquatic Therapy, Patient/Family education, and Balance training  PLAN FOR NEXT SESSION: Continue BLE strengthening.  Pt may bring in her 4WW next visit; Ask about back pain and progress standing and gait tolerance; RLE strength for car transfers; pt requests not to perform Nustep and physioball activities d/t hip pain; pt reports that she would like to work towards being able to walk down her ramp and into her car-work on outdoor sidewalk distance to simulate ramp  AQUATICS: Frequency: 1x/week Duration: 4 weeks  Special Instruction: chronic hip and back pain, imbalance     Greig Anon, PT 03/13/24 2:07 PM Phone: [phone] Fax: [phone]   [facility] at [facility] [address], [address] [place], [place] [address] Phone # [phone] Fax # [phone]

## 2024-03-14 ENCOUNTER — Ambulatory Visit: Admitting: Emergency Medicine

## 2024-03-15 ENCOUNTER — Encounter: Payer: Self-pay | Admitting: Physical Therapy

## 2024-03-15 ENCOUNTER — Ambulatory Visit: Admitting: Physical Therapy

## 2024-03-15 DIAGNOSIS — M6281 Muscle weakness (generalized): Secondary | ICD-10-CM

## 2024-03-15 DIAGNOSIS — R2681 Unsteadiness on feet: Secondary | ICD-10-CM

## 2024-03-15 DIAGNOSIS — R2689 Other abnormalities of gait and mobility: Secondary | ICD-10-CM

## 2024-03-15 NOTE — Therapy (Addendum)
 OUTPATIENT PHYSICAL THERAPY NEURO TREATMENT NOTE   Patient Name: [name] MRN: [id number] DOB:[birth year], 68 y.o., female Today's Date: 03/15/2024   PCP: Rolinda Millman, MD  REFERRING PROVIDER: Urbano Albright, MD    END OF SESSION:  PT End of Session - 03/15/24 1138     Visit Number 11    Number of Visits 17    Date for PT Re-Evaluation 04/19/24    Authorization Type BCBS Medicare    Progress Note Due on Visit 10    PT Start Time 0930    PT Stop Time 1013    PT Time Calculation (min) 43 min    Equipment Utilized During Treatment Other (comment)   aquatic devices as needed for safety and challenge - see note   Activity Tolerance Patient tolerated treatment well;Patient limited by pain    Behavior During Therapy WFL for tasks assessed/performed             Past Medical History:  Diagnosis Date   Anemia    Anxiety    Arthritis    Bipolar disorder (HCC)    Cardiomyopathy (HCC)    Complication of anesthesia    Awake but could not move   Depression    Diskitis 10/04/2023   GERD (gastroesophageal reflux disease)    Heart failure (HCC)    Acute   Heart murmur    related to VSD   High cholesterol    History of blood transfusion    related to OR (08/19/2016)   History of hiatal hernia    Hypertension    Hyperthyroidism    Hypotension 09/06/2023   Mild cognitive impairment 09/06/2018   Paroxysmal ventricular tachycardia (HCC)    Pneumonia    Rectovaginal fistula 09/06/2023   Tachycardia, unspecified    Type II diabetes mellitus (HCC)    UTI (urinary tract infection)    being treated with Keflex    Ventricular septal defect    Ventriculitis of brain due to bacteria 11/02/2022   Vertebral fracture, osteoporotic (HCC) 01/06/2023   Past Surgical History:  Procedure Laterality Date   ABDOMINAL HYSTERECTOMY     BACK SURGERY     CARDIAC CATHETERIZATION N/A 06/23/2015   Procedure: Left Heart Cath and Coronary Angiography;  Surgeon: Ezra GORMAN Shuck, MD;  Location: [facility] INVASIVE CV LAB;  Service: Cardiovascular;  Laterality: N/A;   CARDIAC CATHETERIZATION  1960   VSD was so small; didn't need repaired   COLECTOMY, SIGMOID, ROBOT-ASSISTED N/A 11/30/2023   Procedure: COLECTOMY, SIGMOID, ROBOT-ASSISTED ROBOTIC LOW ANTERIOR RECTOSIGMOID RESECTION (LAR) WITH ANASTOMOSIS COLOVAGINAL FISTULA TAKEDOWN & REPAIR OMENTOPEXY OF VAGINA INTRAOPERATIVE ASSESSMENT OF TISSUE VASCULAR PERFUSION USING ICG (indocyanine green ) IMMUNOFLUORESCENCE TRANSVERSUS ABDOMINIS PLANE (TAP) BLOCK - BILATERAL;  Surgeon: Sheldon Standing, MD;  Location: [facility] ORS;  Service: General;  Late   CYSTOSCOPY WITH INDOCYANINE GREEN  IMAGING (ICG) N/A 11/30/2023   Procedure: Cystoscopy with bilateral ureteral FireFly injections Transurethral resection of bladder tumor--large;  Surgeon: Carolee Sherwood JONETTA DOUGLAS, MD;  Location: [facility];  Service: Urology;  Laterality: N/A;   EXAM UNDER ANESTHESIA WITH MANIPULATION OF HIP Right 06/02/2014   dr sheril   FLEXIBLE SIGMOIDOSCOPY N/A 11/30/2023   Procedure: KINGSTON SIDE;  Surgeon: Sheldon Standing, MD;  Location: [facility];  Service: General;  Laterality: N/A;   FRACTURE SURGERY     HERNIA REPAIR     HIP CLOSED REDUCTION Right 06/02/2014   Procedure: CLOSED MANIPULATION HIP;  Surgeon: Maude KANDICE sheril, MD;  Location: [facility] OR;  Service: Orthopedics;  Laterality:  Right;   IR FLUORO GUIDED NEEDLE PLC ASPIRATION/INJECTION LOC  08/09/2023   JOINT REPLACEMENT     JOINT REPLACEMENT     POSTERIOR LUMBAR FUSION 4 LEVEL N/A 06/26/2022   Procedure: Lumbar One To Lumbar Five Posterior Instrumented Fusion;  Surgeon: Cheryle Debby LABOR, MD;  Location: [facility] OR;  Service: Neurosurgery;  Laterality: N/A;   REFRACTIVE SURGERY Bilateral    SHOULDER ARTHROSCOPY Right    SHOULDER OPEN ROTATOR CUFF REPAIR Right    SPINAL FUSION  1996   t10 down to my coccyx   SPINE HARDWARE REMOVAL     TOTAL ABDOMINAL HYSTERECTOMY     TOTAL HIP ARTHROPLASTY Right 05/10/2014    [place]      by dr lonni olcott   TOTAL KNEE ARTHROPLASTY Left    TOTAL SHOULDER ARTHROPLASTY Left 08/19/2016   Procedure: TOTAL SHOULDER ARTHROPLASTY;  Surgeon: Eva Herring, MD;  Location: [facility] OR;  Service: Orthopedics;  Laterality: Left;  Left total shoulder replacement   Patient Active Problem List   Diagnosis Date Noted   Colovaginal fistula 11/30/2023   Diskitis 10/04/2023   Hypotension 09/06/2023   Fracture of tenth thoracic vertebra (HCC) 09/05/2023   History of discitis 09/05/2023   Senile osteoporosis 09/05/2023   History of urinary retention 09/05/2023   Vaginal atrophy 09/05/2023   Nocturia 09/05/2023   Vulvar ulcer 09/05/2023   Pulmonary nodule 08/02/2023   Non-traumatic compression fracture of vertebral column (HCC) 06/24/2023   Nonobstructive atherosclerosis of coronary artery 02/10/2023   Bilateral pseudophakia 02/03/2023   Vertebral fracture, osteoporotic (HCC) 01/06/2023   Ventriculitis of brain due to bacteria 11/02/2022   Anemia of chronic illness 10/12/2022   Bipolar disorder, in partial remission, most recent episode depressed (HCC) 10/07/2022   Adjustment disorder with mixed anxiety and depressed mood 09/30/2022   Chronic wound 09/25/2022   Hyperglycemia 09/25/2022   Chronic midline thoracic back pain 09/17/2022   Primary insomnia 09/17/2022   Chronic combined systolic and diastolic CHF (congestive heart failure) (HCC) 09/14/2022   Acute blood loss anemia 09/09/2022   Infection of lumbar spine (HCC) 09/03/2022   Malnutrition of moderate degree 08/20/2022   Hardware complicating wound infection (HCC) 08/20/2022   Infection of deep incisional surgical site after procedure 08/20/2022   Aspiration pneumonia of both lungs (HCC) 08/19/2022   Acute respiratory failure with hypoxia (HCC) 08/19/2022   Sepsis (HCC) 08/19/2022   Seizure (HCC) 08/19/2022   Wound dehiscence 08/09/2022   Delayed surgical wound healing 07/08/2022   Fracture of lumbar  spine without cord injury (HCC) 06/26/2022   Lumbar vertebral fracture (HCC) 06/25/2022   Mood disorder (HCC) 09/29/2021   Generalized muscle weakness 04/30/2019   Mild cognitive impairment 09/06/2018   Hyponatremia 02/28/2018   S/P shoulder replacement, left 08/19/2016   Anxiety 10/17/2014   Acid reflux 10/17/2014   BP (high blood pressure) 10/17/2014   Arthritis, degenerative 10/17/2014   Adult hypothyroidism 10/17/2014   UTI (urinary tract infection) 06/03/2014   Fracture of bone adjacent to prosthesis 06/03/2014   Peri-prosthetic fracture of femur following total hip arthroplasty 06/02/2014   Urinary retention 05/13/2014   Chronic pain 05/10/2014   History of hip surgery 05/10/2014   Osteoarthritis 09/30/2011   UNSPECIFIED HEART FAILURE 06/24/2010   UNSPECIFIED CONGENITAL DEFECT OF SEPTAL CLOSURE 06/24/2010   Secondary cardiomyopathy (HCC) 03/18/2010   Tobacco use 03/18/2010   DM 06/13/2009   HYPERTENSION, UNSPECIFIED 06/13/2009   VENTRICULAR TACHYCARDIA 06/13/2009   VENTRICULAR SEPTAL DEFECT, CONGENITAL 06/13/2009   Essential (primary) hypertension 06/13/2009  Diabetes mellitus type 2, noninsulin dependent (HCC) 06/13/2009    ONSET DATE: 01/05/2024 MD referral  REFERRING DIAG: R26.9 (ICD-10-CM) - Gait difficulty   THERAPY DIAG:  Muscle weakness (generalized)  Unsteadiness on feet  Other abnormalities of gait and mobility  Rationale for Evaluation and Treatment: Rehabilitation  SUBJECTIVE:                                                                                                                                                                                             SUBJECTIVE STATEMENT: Pt reports increased pain today but that she took a muscle relaxer and Tylenol  which mildly dampened her pain.  She presents to [facility] in transport chair with RN friend Waddell who drove her today. Pt accompanied by: self  PERTINENT HISTORY: Chronic osteomyelitis with  worsening back pain; Hx of multiple R hip fractures with surgery to revisions, significant leg length difference with LLE longer than RLE, hx of infection post surgery in spine (treated on antibiotics) Per MD Note 01/09/2024:    Patient was seen by Dr. Elspeth Schultze who scheduled her for surgery and on  12/01/2023 she underwent   -ROBOTIC LOW ANTERIOR RECTOSIGMOID RESECTION (LAR) WITH ANASTOMOSIS -COLOVAGINAL FISTULA TAKEDOWN & REPAIR -OMENTOPEXY OF VAGINA -INTRAOPERATIVE ASSESSMENT OF TISSUE VASCULAR PERFUSION USING ICG (indocyanine green ) IMMUNOFLUORESCENCE -TRANSVERSUS ABDOMINIS PLANE (TAP) BLOCK - BILATERAL -FLEXIBLE SIGMOIDOSCOPY    Urology did joint surgery and did    1.  Cystoscopy with bilateral ureteral FireFly injections 2.  Transurethral resection of  possible bladder tumor--large   Pathology came back with:   A. BLADDER TUMOR, TRANSURETHERAL RESECTON:  Florid cystitis cystica and papillary cystitis  Negative for carcinoma   B. RECTOSIGMOID COLON RESECTION:  Diverticula with chronic inflammation, perforation, fistula and reactive  fibrosis  Negative for carcinoma  Five benign lymph nodes (0/5)   C. FINAL DISTAL MARGIN:  Benign colonic tissue         PAIN:  Are you having pain? Yes: NPRS scale: 6-7/10 Pain location: R hip/back Pain description: throbbing, grinding, sharp Aggravating factors: more activities, [place] Relieving factors: rest  PRECAUTIONS: Fall and Other: Recent colectomy-pt reports no restrictions Pt has history of osteoporosis and compression fractures RED FLAGS: None   WEIGHT BEARING RESTRICTIONS: No  FALLS: Has patient fallen in last 6 months? No  LIVING ENVIRONMENT: Lives with: lives with their family Lives in: House/apartment Stairs: long ramp in/out of home Has following equipment at home: Vannie - 2 wheeled and Wheelchair (manual)  PLOF: Needs assistance with gait and Vocation/Vocational requirements: retired  Runner, broadcasting/film/video, Tree surgeon; enjoys spending time with grandchildren in [place]  and [place]  PATIENT GOALS: To get stronger, to get back on a cane.  To use walker to get to the car (long ramp), get RLE into car better  OBJECTIVE:  TODAY'S TREATMENT: 03/15/2024 Aquatic therapy at [facility] - pool temperature 92 degrees   Patient seen for aquatic therapy today.  Treatment took place in water  3.6-4.8 feet deep depending upon activity.  Patient entered and exited the pool via stairs using step to pattern w/ Bil rails at SBA level.   Exercises: Water  walking warmup 4x18 ft forward > backward > 2x18 laterally unsupported SBA, pt requires seated recovery following lateral stepping due to R hip fatigue and burning -In unsupported bench sitting:  STS x15 > STS w/ low resistance DB pushdown x12 -Lateral step out w/ DB adduction 2x18 ft prior to R hip fatigue and burning w/ pt requesting to focus remained of session in sitting:  -Attempted low resistance DB shoulder flexion/extension unsupported w/ pt unable to tolerate resistance for prolonged reps so regressed to palms open w/ good tolerance x20  -Palm open shoulder abduction unsupported sitting (intermittent posterior support due to low back discomfort) x20  -Palm open shoulder horizontal adduction in unsupported sitting x20  -Palm open chest press w/ return demo in unsupported sitting x20  -Lateral lean into edge of stability using core for righting to midline x10 each side  Patient requires buoyancy of the water  for support for reduced fall risk with gait training and balance exercises with SBA support and verbal cuing and return demonstration. Exercises able to be performed safely in water  without the risk of fall compared to those same exercises performed on land; viscosity of water  needed for resistance for strengthening. Current of water  provides perturbations for challenging static and dynamic balance.     TODAY'S TREATMENT:  03/13/2024 Activity Comments  Sit to stand from chair  5 reps 19.71 sec with UE support  LAQ 10 reps, 3# LLE Seated march x 10, 3# LLE Hamstring curl, green band x10 Heel raises 10 reps    Gait velocity= 17.82 sec Gait 100 ft with RW 1.84 f/tsec  Repeated the above interval, 2 additional reps 2 min and 3 min break in between To address stamina, endurance  (Placed tennis balls on RW)             PATIENT EDUCATION: Education details: Benefits of aquatic exercise and general anatomy as it relates to each activity and goal Person educated: Patient Education method: Explanation, Demonstration, Tactile cues, Verbal cues, and Handouts Education comprehension: verbalized understanding and returned demonstration    Access Code: [email or web address] URL: [email or web address] Date: 02/02/2024 Prepared by: [facility] - Outpatient  Rehab - [facility]  Program Notes Seated leg slides out and in;  have a pillow case under your right foot-slide your foot out and in, 3 x 10 reps.  You can have a 3-5# ankle weight on your ankle  Exercises - Seated Hip Adduction Isometrics with Ball  - 1 x daily - 7 x weekly - 3 sets - 10 reps - Seated Hip Abduction with Resistance  - 1 x daily - 7 x weekly - 3 sets - 10 reps - Sit to Stand with Resistance Around Legs  - 1 x daily - 7 x weekly - 3 sets - 5 reps - Seated Shoulder Row with Anchored Resistance  - 1 x daily - 5 x weekly - 2 sets - 10 reps - Seated Anti-Rotation Press With Anchored Resistance  -  1 x daily - 7 x weekly - 3 sets - 10 reps    --------------------------------------------------- Note: Objective measures were completed at Evaluation unless otherwise noted.  DIAGNOSTIC FINDINGS: MRI 09/2023   IMPRESSION: 1. Postoperative changes of T11-S1 laminectomy and posterior spinal fusion with right posterior spinal instrumentation from L2-L5. On prior CT, there is evidence of fracture through the left posterior fusion mass at L4-5,  correlating with edema is seen in the left pedicle of L4 on today's study. 2. Decreased edema within the L4 and L5 vertebral bodies and posterior elements, as well as within the L4-5 and L3-4 discs, likely reactive and related to the above fracture. 3. Loculated simple fluid within the thecal sac with some marginalized cauda equina nerve roots, most consistent with arachnoiditis. 4. No spinal canal stenosis or neural foraminal narrowing.  COGNITION: Overall cognitive status: History of cognitive impairments - at baseline   SENSATION: Light touch: Impaired  and to light touch-see below Reports some numbness L foot  POSTURE: Leg length diffierence  LOWER EXTREMITY ROM:     Active  Right Eval Left Eval  Hip flexion    Hip extension    Hip abduction    Hip adduction    Hip internal rotation    Hip external rotation    Knee flexion    Knee extension    Ankle dorsiflexion    Ankle plantarflexion    Ankle inversion    Ankle eversion     (Blank rows = not tested)  LOWER EXTREMITY MMT:    MMT Right Eval Left Eval  Hip flexion 3- 3+  Hip extension    Hip abduction 4 4  Hip adduction 4 4  Hip internal rotation    Hip external rotation    Knee flexion 4 4  Knee extension 3+ 4  Ankle dorsiflexion 3+ 3+  Ankle plantarflexion    Ankle inversion    Ankle eversion    (Blank rows = not tested)   TRANSFERS: Sit to stand: SBA  Assistive device utilized: Environmental consultant - 2 wheeled     Stand to sit: SBA  Assistive device utilized: Environmental consultant - 2 wheeled      GAIT: Findings: Gait Characteristics: excess lateral weightshift through trunk due to significant leg length difference and step through pattern, Distance walked: 50 ft, Assistive device utilized:Walker - 2 wheeled, Level of assistance: CGA, and Comments: NA  FUNCTIONAL TESTS:  5 times sit to stand: 14.44 sec with BUE support Timed up and go (TUG): 24.53 sec 2 minute walk test: NT 10 meter walk test: 21.34 sec (1.54  ft/sec) TUG cognitive: (naming colors) 28.62 sec 70M walk back:  42.16 sec with RW Stroop gait:  13.25 sec in 10 ft (naming color words)-0.75 ft/sec   18.75 sec in 10 feet (naming printed colors)-0.53 ft/sec                                                                          PATIENT EDUCATION: Education details: See above. Person educated: Patient Education method: Explanation Education comprehension: verbalized understanding  HOME EXERCISE PROGRAM: Not yet initiated Did perform eccentric hip flexion/extension in sitting  GOALS: Goals reviewed with patient? Yes  SHORT TERM GOALS: Target date: 02/24/2024  Pt will be independent with HEP for improved strength, balance, gait. Baseline: printed out and reviewed 02/23/24 Goal status: MET 02/23/24  2.  Pt will improve TUG score to less than or equal to 18 sec for decreased fall risk. Baseline: 24.53 sec; 14.54 sec with 4WW 02/23/24 Goal status: MET 02/23/24  3.  Pt will improve 3 M Walk backwards to less than or equal to 30 seconds for decreased fall risk. Baseline: 42.16 sec; 9.96 sec 02/23/24 Goal status: MET 02/23/24  4.  Pt will report at least 50% improvement in car transfers.  Baseline:  difficulty with RLE; Patient reports that she has been able to swing her LEs into the car by herself- reports 50% improvement. 02/23/24  Goal status:  MET 02/23/24  LONG TERM GOALS: Target date: 04/19/2024  Pt will be independent with HEP for improved strength, balance, gait. Baseline:  Goal status: IN PROGRESS  2.  Pt will improve gait velocity to at least 1.8 ft/sec for improved gait efficiency and safety. Baseline: 1.54 ft/sec Goal status: IN PROGRESS  3.  Pt will improve gait velocity with dual task to at least 1 ft/sec for improved gait and decreased fall risk. Baseline: 0.53-0.75 ft/sec with Stroop over 10 ft Goal status: IN PROGRESS  4.  Pt will improve 5x sit<>stand to less than or equal to 11.5 sec with light UE support to demonstrate  improved functional strength and transfer efficiency. Baseline: 14.44 sec Goal status: IN PROGRESS  5.  Pt will verbalize plans for continued community fitness upon d/c from PT to maximize gains made in PT. Baseline:  Goal status: IN PROGRESS  6.  Pt will ambulate at least 200 ft in 2 minute walk, for improved gait efficiency and endurance.  Baseline: 100 ft in 1:30  Goal status:  IN PROGRESS    ASSESSMENT:  CLINICAL IMPRESSION: Pt seen for initial aquatic visit this POC.  She reports pool environment being more challenging than she remembered though she enjoys this setting.  Her right hip, left shoulder and low back mildly limited session due to intolerance to lateral stepping and certain unsupported sitting tasks for core engagement.  PT will incorporate more noodle based core and balance as pt found this relaxing in prior episodes of aquatic PT.  She continues to benefit from this setting as a supplement to land based strengthening and dynamic stability challenge and as a way to attempt further pain management.  Continue per POC.  OBJECTIVE IMPAIRMENTS: Abnormal gait, decreased balance, decreased mobility, difficulty walking, decreased strength, impaired sensation, postural dysfunction, and pain.   ACTIVITY LIMITATIONS: sitting, standing, transfers, locomotion level, and caring for others  PARTICIPATION LIMITATIONS: driving, shopping, community activity, and travel  PERSONAL FACTORS: 3+ comorbidities: see PMH above are also affecting patient's functional outcome.   REHAB POTENTIAL: Good  CLINICAL DECISION MAKING: Evolving/moderate complexity  EVALUATION COMPLEXITY: Moderate  PLAN:  PT FREQUENCY: 2x/week  PT DURATION: 8 weeks plus eval visit  PLANNED INTERVENTIONS: [id number]- PT Re-evaluation, 97750- Physical Performance Testing, 97110-Therapeutic exercises, 97530- Therapeutic activity, [id number]- Neuromuscular re-education, 97535- Self Care, [id number]- Manual therapy, [id number]- Gait  training, [phone]- Canalith repositioning, [id number]- Aquatic Therapy, Patient/Family education, and Balance training  PLAN FOR NEXT SESSION: Continue BLE strengthening.  Pt may bring in her 4WW next visit; Ask about back pain and progress standing and gait tolerance; RLE strength for car transfers; pt requests not to perform Nustep and physioball activities d/t hip pain; pt reports that she would like to work towards being able to  walk down her ramp and into her car-work on outdoor sidewalk distance to simulate ramp  AQUATICS: Frequency: 1x/week Duration: 4 weeks  Special Instruction: chronic hip and back pain, imbalance   Aquatics:  continue core work - progress to lightweight DB for UE/core, straddle on noodle for paddling as pt has had prior pain relief with this.  Daved Bull, PT, DPT

## 2024-03-20 ENCOUNTER — Ambulatory Visit: Attending: Physical Medicine & Rehabilitation | Admitting: Physical Therapy

## 2024-03-20 ENCOUNTER — Encounter: Payer: Self-pay | Admitting: Physical Therapy

## 2024-03-20 DIAGNOSIS — R2681 Unsteadiness on feet: Secondary | ICD-10-CM | POA: Diagnosis present

## 2024-03-20 DIAGNOSIS — R2689 Other abnormalities of gait and mobility: Secondary | ICD-10-CM | POA: Insufficient documentation

## 2024-03-20 DIAGNOSIS — M6281 Muscle weakness (generalized): Secondary | ICD-10-CM | POA: Insufficient documentation

## 2024-03-20 NOTE — Therapy (Signed)
 OUTPATIENT PHYSICAL THERAPY NEURO TREATMENT NOTE   Patient Name: [name] MRN: [id number] DOB:September 08, 1955, 68 y.o., female Today's Date: 03/20/2024   PCP: Rolinda Millman, MD  REFERRING PROVIDER: Urbano Albright, MD    END OF SESSION:  PT End of Session - 03/20/24 1407     Visit Number 12    Number of Visits 17    Date for PT Re-Evaluation 04/19/24    Authorization Type BCBS Medicare    Progress Note Due on Visit 10    PT Start Time 1405    PT Stop Time 1445    PT Time Calculation (min) 40 min    Equipment Utilized During Treatment Other (comment)    Activity Tolerance Patient tolerated treatment well;Patient limited by pain    Behavior During Therapy WFL for tasks assessed/performed              Past Medical History:  Diagnosis Date   Anemia    Anxiety    Arthritis    Bipolar disorder (HCC)    Cardiomyopathy (HCC)    Complication of anesthesia    Awake but could not move   Depression    Diskitis 10/04/2023   GERD (gastroesophageal reflux disease)    Heart failure (HCC)    Acute   Heart murmur    related to VSD   High cholesterol    History of blood transfusion    related to OR (08/19/2016)   History of hiatal hernia    Hypertension    Hyperthyroidism    Hypotension 09/06/2023   Mild cognitive impairment 09/06/2018   Paroxysmal ventricular tachycardia (HCC)    Pneumonia    Rectovaginal fistula 09/06/2023   Tachycardia, unspecified    Type II diabetes mellitus (HCC)    UTI (urinary tract infection)    being treated with Keflex    Ventricular septal defect    Ventriculitis of brain due to bacteria 11/02/2022   Vertebral fracture, osteoporotic (HCC) 01/06/2023   Past Surgical History:  Procedure Laterality Date   ABDOMINAL HYSTERECTOMY     BACK SURGERY     CARDIAC CATHETERIZATION N/A 06/23/2015   Procedure: Left Heart Cath and Coronary Angiography;  Surgeon: Ezra GORMAN Shuck, MD;  Location: [facility] INVASIVE CV LAB;  Service: Cardiovascular;   Laterality: N/A;   CARDIAC CATHETERIZATION  1960   VSD was so small; didn't need repaired   COLECTOMY, SIGMOID, ROBOT-ASSISTED N/A 11/30/2023   Procedure: COLECTOMY, SIGMOID, ROBOT-ASSISTED ROBOTIC LOW ANTERIOR RECTOSIGMOID RESECTION (LAR) WITH ANASTOMOSIS COLOVAGINAL FISTULA TAKEDOWN & REPAIR OMENTOPEXY OF VAGINA INTRAOPERATIVE ASSESSMENT OF TISSUE VASCULAR PERFUSION USING ICG (indocyanine green ) IMMUNOFLUORESCENCE TRANSVERSUS ABDOMINIS PLANE (TAP) BLOCK - BILATERAL;  Surgeon: Sheldon Standing, MD;  Location: [facility] ORS;  Service: General;  Late   CYSTOSCOPY WITH INDOCYANINE GREEN  IMAGING (ICG) N/A 11/30/2023   Procedure: Cystoscopy with bilateral ureteral FireFly injections Transurethral resection of bladder tumor--large;  Surgeon: Carolee Sherwood JONETTA DOUGLAS, MD;  Location: [facility];  Service: Urology;  Laterality: N/A;   EXAM UNDER ANESTHESIA WITH MANIPULATION OF HIP Right 06/02/2014   dr sheril   FLEXIBLE SIGMOIDOSCOPY N/A 11/30/2023   Procedure: KINGSTON SIDE;  Surgeon: Sheldon Standing, MD;  Location: [facility] OR[facility];  Service: General;  Laterality: N/A;   FRACTURE SURGERY     HERNIA REPAIR     HIP CLOSED REDUCTION Right 06/02/2014   Procedure: CLOSED MANIPULATION HIP;  Surgeon: Maude KANDICE sheril, MD;  Location: [facility] OR;  Service: Orthopedics;  Laterality: Right;   IR FLUORO GUIDED NEEDLE PLC ASPIRATION/INJECTION LOC  08/09/2023   JOINT REPLACEMENT     JOINT REPLACEMENT     POSTERIOR LUMBAR FUSION 4 LEVEL N/A 06/26/2022   Procedure: Lumbar One To Lumbar Five Posterior Instrumented Fusion;  Surgeon: Cheryle Debby LABOR, MD;  Location: [facility] OR;  Service: Neurosurgery;  Laterality: N/A;   REFRACTIVE SURGERY Bilateral    SHOULDER ARTHROSCOPY Right    SHOULDER OPEN ROTATOR CUFF REPAIR Right    SPINAL FUSION  1996   t10 down to my coccyx   SPINE HARDWARE REMOVAL     TOTAL ABDOMINAL HYSTERECTOMY     TOTAL HIP ARTHROPLASTY Right 05/10/2014   [place]      by dr lonni olcott   TOTAL KNEE ARTHROPLASTY  Left    TOTAL SHOULDER ARTHROPLASTY Left 08/19/2016   Procedure: TOTAL SHOULDER ARTHROPLASTY;  Surgeon: Eva Herring, MD;  Location: [facility] OR;  Service: Orthopedics;  Laterality: Left;  Left total shoulder replacement   Patient Active Problem List   Diagnosis Date Noted   Colovaginal fistula 11/30/2023   Diskitis 10/04/2023   Hypotension 09/06/2023   Fracture of tenth thoracic vertebra (HCC) 09/05/2023   History of discitis 09/05/2023   Senile osteoporosis 09/05/2023   History of urinary retention 09/05/2023   Vaginal atrophy 09/05/2023   Nocturia 09/05/2023   Vulvar ulcer 09/05/2023   Pulmonary nodule 08/02/2023   Non-traumatic compression fracture of vertebral column (HCC) 06/24/2023   Nonobstructive atherosclerosis of coronary artery 02/10/2023   Bilateral pseudophakia 02/03/2023   Vertebral fracture, osteoporotic (HCC) 01/06/2023   Ventriculitis of brain due to bacteria 11/02/2022   Anemia of chronic illness 10/12/2022   Bipolar disorder, in partial remission, most recent episode depressed (HCC) 10/07/2022   Adjustment disorder with mixed anxiety and depressed mood 09/30/2022   Chronic wound 09/25/2022   Hyperglycemia 09/25/2022   Chronic midline thoracic back pain 09/17/2022   Primary insomnia 09/17/2022   Chronic combined systolic and diastolic CHF (congestive heart failure) (HCC) 09/14/2022   Acute blood loss anemia 09/09/2022   Infection of lumbar spine (HCC) 09/03/2022   Malnutrition of moderate degree 08/20/2022   Hardware complicating wound infection (HCC) 08/20/2022   Infection of deep incisional surgical site after procedure 08/20/2022   Aspiration pneumonia of both lungs (HCC) 08/19/2022   Acute respiratory failure with hypoxia (HCC) 08/19/2022   Sepsis (HCC) 08/19/2022   Seizure (HCC) 08/19/2022   Wound dehiscence 08/09/2022   Delayed surgical wound healing 07/08/2022   Fracture of lumbar spine without cord injury (HCC) 06/26/2022   Lumbar vertebral fracture  (HCC) 06/25/2022   Mood disorder (HCC) 09/29/2021   Generalized muscle weakness 04/30/2019   Mild cognitive impairment 09/06/2018   Hyponatremia 02/28/2018   S/P shoulder replacement, left 08/19/2016   Anxiety 10/17/2014   Acid reflux 10/17/2014   BP (high blood pressure) 10/17/2014   Arthritis, degenerative 10/17/2014   Adult hypothyroidism 10/17/2014   UTI (urinary tract infection) 06/03/2014   Fracture of bone adjacent to prosthesis 06/03/2014   Peri-prosthetic fracture of femur following total hip arthroplasty 06/02/2014   Urinary retention 05/13/2014   Chronic pain 05/10/2014   History of hip surgery 05/10/2014   Osteoarthritis 09/30/2011   UNSPECIFIED HEART FAILURE 06/24/2010   UNSPECIFIED CONGENITAL DEFECT OF SEPTAL CLOSURE 06/24/2010   Secondary cardiomyopathy (HCC) 03/18/2010   Tobacco use 03/18/2010   DM 06/13/2009   HYPERTENSION, UNSPECIFIED 06/13/2009   VENTRICULAR TACHYCARDIA 06/13/2009   VENTRICULAR SEPTAL DEFECT, CONGENITAL 06/13/2009   Essential (primary) hypertension 06/13/2009   Diabetes mellitus type 2, noninsulin dependent (HCC) 06/13/2009  ONSET DATE: 01/05/2024 MD referral  REFERRING DIAG: R26.9 (ICD-10-CM) - Gait difficulty   THERAPY DIAG:  Unsteadiness on feet  Other abnormalities of gait and mobility  Muscle weakness (generalized)  Rationale for Evaluation and Treatment: Rehabilitation  SUBJECTIVE:                                                                                                                                                                                             SUBJECTIVE STATEMENT: Worked really hard in the pool.I feel like I am walking more and recovering quicker after therapy sessions.  A little sore, but okay the next day.  My hip is really bothering me today from all the walking and standing I did over the weekend. Pt accompanied by: self  PERTINENT HISTORY: Chronic osteomyelitis with worsening back pain; Hx of  multiple R hip fractures with surgery to revisions, significant leg length difference with LLE longer than RLE, hx of infection post surgery in spine (treated on antibiotics) Per MD Note 01/09/2024:    Patient was seen by Dr. Elspeth Schultze who scheduled her for surgery and on  12/01/2023 she underwent   -ROBOTIC LOW ANTERIOR RECTOSIGMOID RESECTION (LAR) WITH ANASTOMOSIS -COLOVAGINAL FISTULA TAKEDOWN & REPAIR -OMENTOPEXY OF VAGINA -INTRAOPERATIVE ASSESSMENT OF TISSUE VASCULAR PERFUSION USING ICG (indocyanine green ) IMMUNOFLUORESCENCE -TRANSVERSUS ABDOMINIS PLANE (TAP) BLOCK - BILATERAL -FLEXIBLE SIGMOIDOSCOPY    Urology did joint surgery and did    1.  Cystoscopy with bilateral ureteral FireFly injections 2.  Transurethral resection of  possible bladder tumor--large   Pathology came back with:   A. BLADDER TUMOR, TRANSURETHERAL RESECTON:  Florid cystitis cystica and papillary cystitis  Negative for carcinoma   B. RECTOSIGMOID COLON RESECTION:  Diverticula with chronic inflammation, perforation, fistula and reactive  fibrosis  Negative for carcinoma  Five benign lymph nodes (0/5)   C. FINAL DISTAL MARGIN:  Benign colonic tissue         PAIN:  Are you having pain? Yes: NPRS scale: 6/10 Pain location: R hip/back Pain description: throbbing, grinding, sharp Aggravating factors: more activities, [place] Relieving factors: rest  PRECAUTIONS: Fall and Other: Recent colectomy-pt reports no restrictions Pt has history of osteoporosis and compression fractures RED FLAGS: None   WEIGHT BEARING RESTRICTIONS: No  FALLS: Has patient fallen in last 6 months? No  LIVING ENVIRONMENT: Lives with: lives with their family Lives in: House/apartment Stairs: long ramp in/out of home Has following equipment at home: Vannie - 2 wheeled and Wheelchair (manual)  PLOF: Needs assistance with gait and Vocation/Vocational requirements: retired Runner, broadcasting/film/video, Tree surgeon; enjoys  spending time with grandchildren in [place]  and [place]  PATIENT GOALS: To get stronger, to get back on a cane.  To use walker to get to the car (long ramp), get RLE into car better  OBJECTIVE:    TODAY'S TREATMENT: 03/20/2024 Activity Comments  Gait 4WW 50 ft x 2, indoors Gait 4WW, outdoor incline  Cues for technique to stay close to 4WW and use brakes for decline; to push through for foot clearance up the incline, with supervision  LAQ 3 x 10 reps, 3# BLE Seated march 3 x 10, 3# LLE Hamstring curl, green band 3 x10 Heel raises 2 x 10 reps   No weight added RLE  Reviewed (without band) core stability/posture exercises as part of HEP: shoulder horizontal adduction, paloff press position Cues for paloff press; no c/o pain              TODAY'S TREATMENT: 03/15/2024 Aquatic therapy at [facility] - pool temperature 92 degrees   Patient seen for aquatic therapy today.  Treatment took place in water  3.6-4.8 feet deep depending upon activity.  Patient entered and exited the pool via stairs using step to pattern w/ Bil rails at SBA level.   Exercises: Water  walking warmup 4x18 ft forward > backward > 2x18 laterally unsupported SBA, pt requires seated recovery following lateral stepping due to R hip fatigue and burning -In unsupported bench sitting:  STS x15 > STS w/ low resistance DB pushdown x12 -Lateral step out w/ DB adduction 2x18 ft prior to R hip fatigue and burning w/ pt requesting to focus remained of session in sitting:  -Attempted low resistance DB shoulder flexion/extension unsupported w/ pt unable to tolerate resistance for prolonged reps so regressed to palms open w/ good tolerance x20  -Palm open shoulder abduction unsupported sitting (intermittent posterior support due to low back discomfort) x20  -Palm open shoulder horizontal adduction in unsupported sitting x20  -Palm open chest press w/ return demo in unsupported sitting x20  -Lateral lean into edge of  stability using core for righting to midline x10 each side  Patient requires buoyancy of the water  for support for reduced fall risk with gait training and balance exercises with SBA support and verbal cuing and return demonstration. Exercises able to be performed safely in water  without the risk of fall compared to those same exercises performed on land; viscosity of water  needed for resistance for strengthening. Current of water  provides perturbations for challenging static and dynamic balance.     TODAY'S TREATMENT: 03/13/2024 Activity Comments  Sit to stand from chair  5 reps 19.71 sec with UE support  LAQ 10 reps, 3# LLE Seated march x 10, 3# LLE Hamstring curl, green band x10 Heel raises 10 reps    Gait velocity= 17.82 sec Gait 100 ft with RW 1.84 f/tsec  Repeated the above interval, 2 additional reps 2 min and 3 min break in between To address stamina, endurance  (Placed tennis balls on RW)             PATIENT EDUCATION: Education details:03/20/2024:  Ways to be more compliant with HEP:  penciling in HEP in calendar, daily reminder on phone; discussed updating HEP to reflect some of the supine exercises she has continued to do from last bout of therapy, so we can progress weight/theraband/sets Person educated: Patient Education method: Explanation Education comprehension: verbalized understanding and returned demonstration    Access Code: [email or web address] URL: [email or web address] Date: 02/02/2024 Prepared by: [facility]  Re[facility] - [facility]  Program  Notes Seated leg slides out and in;  have a pillow case under your right foot-slide your foot out and in, 3 x 10 reps.  You can have a 3-5# ankle weight on your ankle  Exercises - Seated Hip Adduction Isometrics with Ball  - 1 x daily - 7 x weekly - 3 sets - 10 reps - Seated Hip Abduction with Resistance  - 1 x daily - 7 x weekly - 3 sets - 10 reps - Sit to Stand with Resistance Around Legs  - 1 x  daily - 7 x weekly - 3 sets - 5 reps - Seated Shoulder Row with Anchored Resistance  - 1 x daily - 5 x weekly - 2 sets - 10 reps - Seated Anti-Rotation Press With Anchored Resistance  - 1 x daily - 7 x weekly - 3 sets - 10 reps    --------------------------------------------------- Note: Objective measures were completed at Evaluation unless otherwise noted.  DIAGNOSTIC FINDINGS: MRI 09/2023   IMPRESSION: 1. Postoperative changes of T11-S1 laminectomy and posterior spinal fusion with right posterior spinal instrumentation from L2-L5. On prior CT, there is evidence of fracture through the left posterior fusion mass at L4-5, correlating with edema is seen in the left pedicle of L4 on today's study. 2. Decreased edema within the L4 and L5 vertebral bodies and posterior elements, as well as within the L4-5 and L3-4 discs, likely reactive and related to the above fracture. 3. Loculated simple fluid within the thecal sac with some marginalized cauda equina nerve roots, most consistent with arachnoiditis. 4. No spinal canal stenosis or neural foraminal narrowing.  COGNITION: Overall cognitive status: History of cognitive impairments - at baseline   SENSATION: Light touch: Impaired  and to light touch-see below Reports some numbness L foot  POSTURE: Leg length diffierence  LOWER EXTREMITY ROM:     Active  Right Eval Left Eval  Hip flexion    Hip extension    Hip abduction    Hip adduction    Hip internal rotation    Hip external rotation    Knee flexion    Knee extension    Ankle dorsiflexion    Ankle plantarflexion    Ankle inversion    Ankle eversion     (Blank rows = not tested)  LOWER EXTREMITY MMT:    MMT Right Eval Left Eval  Hip flexion 3- 3+  Hip extension    Hip abduction 4 4  Hip adduction 4 4  Hip internal rotation    Hip external rotation    Knee flexion 4 4  Knee extension 3+ 4  Ankle dorsiflexion 3+ 3+  Ankle plantarflexion    Ankle inversion     Ankle eversion    (Blank rows = not tested)   TRANSFERS: Sit to stand: SBA  Assistive device utilized: Environmental consultant - 2 wheeled     Stand to sit: SBA  Assistive device utilized: Environmental consultant - 2 wheeled      GAIT: Findings: Gait Characteristics: excess lateral weightshift through trunk due to significant leg length difference and step through pattern, Distance walked: 50 ft, Assistive device utilized:Walker - 2 wheeled, Level of assistance: CGA, and Comments: NA  FUNCTIONAL TESTS:  5 times sit to stand: 14.44 sec with BUE support Timed up and go (TUG): 24.53 sec 2 minute walk test: NT 10 meter walk test: 21.34 sec (1.54 ft/sec) TUG cognitive: (naming colors) 28.62 sec 16M walk back:  42.16 sec with RW Stroop gait:  13.25 sec in  10 ft (naming color words)-0.75 ft/sec   18.75 sec in 10 feet (naming printed colors)-0.53 ft/sec                                                                          PATIENT EDUCATION: Education details: See above. Person educated: Patient Education method: Explanation Education comprehension: verbalized understanding  HOME EXERCISE PROGRAM: Not yet initiated Did perform eccentric hip flexion/extension in sitting  GOALS: Goals reviewed with patient? Yes  SHORT TERM GOALS: Target date: 02/24/2024  Pt will be independent with HEP for improved strength, balance, gait. Baseline: printed out and reviewed 02/23/24 Goal status: MET 02/23/24  2.  Pt will improve TUG score to less than or equal to 18 sec for decreased fall risk. Baseline: 24.53 sec; 14.54 sec with 4WW 02/23/24 Goal status: MET 02/23/24  3.  Pt will improve 3 M Walk backwards to less than or equal to 30 seconds for decreased fall risk. Baseline: 42.16 sec; 9.96 sec 02/23/24 Goal status: MET 02/23/24  4.  Pt will report at least 50% improvement in car transfers.  Baseline:  difficulty with RLE; Patient reports that she has been able to swing her LEs into the car by herself- reports 50% improvement.  02/23/24  Goal status:  MET 02/23/24  LONG TERM GOALS: Target date: 04/19/2024  Pt will be independent with HEP for improved strength, balance, gait. Baseline:  Goal status: IN PROGRESS  2.  Pt will improve gait velocity to at least 1.8 ft/sec for improved gait efficiency and safety. Baseline: 1.54 ft/sec Goal status: IN PROGRESS  3.  Pt will improve gait velocity with dual task to at least 1 ft/sec for improved gait and decreased fall risk. Baseline: 0.53-0.75 ft/sec with Stroop over 10 ft Goal status: IN PROGRESS  4.  Pt will improve 5x sit<>stand to less than or equal to 11.5 sec with light UE support to demonstrate improved functional strength and transfer efficiency. Baseline: 14.44 sec Goal status: IN PROGRESS  5.  Pt will verbalize plans for continued community fitness upon d/c from PT to maximize gains made in PT. Baseline:  Goal status: IN PROGRESS  6.  Pt will ambulate at least 200 ft in 2 minute walk, for improved gait efficiency and endurance.  Baseline: 100 ft in 1:30  Goal status:  IN PROGRESS    ASSESSMENT:  CLINICAL IMPRESSION: Pt presents today and able to work on outdoor gait on slight sidewalk slope with 4WW; was able to go approximately half-way along sloped sidewalk. Skilled PT session focused also on seated ex for lower extremity strengthening.  She needs less rest breaks between sets of exercises, and she is able to use 3# for LAQ on RLE today; still has difficulty raising RLE against gravity with R hip/knee flexion with any weight.  Pt verbalizes interest in ways to be more compliant with HEP; will plan to discuss further and consolidate/progress HEP to include exercises from last bout of therapy, so she can progress more at home.    OBJECTIVE IMPAIRMENTS: Abnormal gait, decreased balance, decreased mobility, difficulty walking, decreased strength, impaired sensation, postural dysfunction, and pain.   ACTIVITY LIMITATIONS: sitting, standing, transfers,  locomotion level, and caring for others  PARTICIPATION LIMITATIONS: driving,  shopping, community activity, and travel  PERSONAL FACTORS: 3+ comorbidities: see PMH above are also affecting patient's functional outcome.   REHAB POTENTIAL: Good  CLINICAL DECISION MAKING: Evolving/moderate complexity  EVALUATION COMPLEXITY: Moderate  PLAN:  PT FREQUENCY: 2x/week  PT DURATION: 8 weeks plus eval visit  PLANNED INTERVENTIONS: [id number]- PT Re-evaluation, 97750- Physical Performance Testing, 97110-Therapeutic exercises, 97530- Therapeutic activity, [id number]- Neuromuscular re-education, 97535- Self Care, [id number]- Manual therapy, [id number]- Gait training, [phone]- Canalith repositioning, [id number]- Aquatic Therapy, Patient/Family education, and Balance training  PLAN FOR NEXT SESSION: Consolidate/update HEP and work to progress weights/band as pt tolerates.  How did trying ramp at home go?  Pt may bring in her 4WW next visit; Ask about back pain and progress standing and gait tolerance; RLE strength for car transfers; pt requests not to perform Nustep and physioball activities d/t hip pain; pt reports that she would like to work towards being able to walk down her ramp and into her car-work on outdoor sidewalk distance to simulate ramp  AQUATICS: Frequency: 1x/week Duration: 4 weeks  Special Instruction: chronic hip and back pain, imbalance   Aquatics:  continue core work - progress to lightweight DB for UE/core, straddle on noodle for paddling as pt has had prior pain relief with this.  Greig Anon, PT 03/20/24 5:12 PM Phone: [phone] Fax: [phone]  [facility] at [facility] [address] [place], [address] [place], [place] [address] Phone # [phone] Fax # [phone]

## 2024-03-22 ENCOUNTER — Ambulatory Visit: Admitting: Physical Therapy

## 2024-03-22 ENCOUNTER — Ambulatory Visit: Attending: Physical Medicine & Rehabilitation | Admitting: Physical Therapy

## 2024-03-22 ENCOUNTER — Encounter: Payer: Self-pay | Admitting: Physical Therapy

## 2024-03-22 DIAGNOSIS — R2681 Unsteadiness on feet: Secondary | ICD-10-CM | POA: Diagnosis present

## 2024-03-22 DIAGNOSIS — M6281 Muscle weakness (generalized): Secondary | ICD-10-CM | POA: Diagnosis present

## 2024-03-22 DIAGNOSIS — R2689 Other abnormalities of gait and mobility: Secondary | ICD-10-CM | POA: Insufficient documentation

## 2024-03-22 NOTE — Therapy (Signed)
 OUTPATIENT PHYSICAL THERAPY NEURO TREATMENT NOTE   Patient Name: [name] MRN: [id number] DOB:12/06/1955, 68 y.o., female Today's Date: 03/22/2024   PCP: Rolinda Millman, MD  REFERRING PROVIDER: Urbano Albright, MD    END OF SESSION:  PT End of Session - 03/22/24 1104     Visit Number 13    Number of Visits 17    Date for PT Re-Evaluation 04/19/24    Authorization Type BCBS Medicare    Progress Note Due on Visit 10    PT Start Time 1100    PT Stop Time 1145    PT Time Calculation (min) 45 min    Equipment Utilized During Treatment Other (comment)   aquatic devices as needed for safety and challenge - see note   Activity Tolerance Patient tolerated treatment well;Patient limited by pain    Behavior During Therapy WFL for tasks assessed/performed              Past Medical History:  Diagnosis Date   Anemia    Anxiety    Arthritis    Bipolar disorder (HCC)    Cardiomyopathy (HCC)    Complication of anesthesia    Awake but could not move   Depression    Diskitis 10/04/2023   GERD (gastroesophageal reflux disease)    Heart failure (HCC)    Acute   Heart murmur    related to VSD   High cholesterol    History of blood transfusion    related to OR (08/19/2016)   History of hiatal hernia    Hypertension    Hyperthyroidism    Hypotension 09/06/2023   Mild cognitive impairment 09/06/2018   Paroxysmal ventricular tachycardia (HCC)    Pneumonia    Rectovaginal fistula 09/06/2023   Tachycardia, unspecified    Type II diabetes mellitus (HCC)    UTI (urinary tract infection)    being treated with Keflex    Ventricular septal defect    Ventriculitis of brain due to bacteria 11/02/2022   Vertebral fracture, osteoporotic (HCC) 01/06/2023   Past Surgical History:  Procedure Laterality Date   ABDOMINAL HYSTERECTOMY     BACK SURGERY     CARDIAC CATHETERIZATION N/A 06/23/2015   Procedure: Left Heart Cath and Coronary Angiography;  Surgeon: Ezra GORMAN Shuck, MD;  Location: [facility] INVASIVE CV LAB;  Service: Cardiovascular;  Laterality: N/A;   CARDIAC CATHETERIZATION  1960   VSD was so small; didn't need repaired   COLECTOMY, SIGMOID, ROBOT-ASSISTED N/A 11/30/2023   Procedure: COLECTOMY, SIGMOID, ROBOT-ASSISTED ROBOTIC LOW ANTERIOR RECTOSIGMOID RESECTION (LAR) WITH ANASTOMOSIS COLOVAGINAL FISTULA TAKEDOWN & REPAIR OMENTOPEXY OF VAGINA INTRAOPERATIVE ASSESSMENT OF TISSUE VASCULAR PERFUSION USING ICG (indocyanine green ) IMMUNOFLUORESCENCE TRANSVERSUS ABDOMINIS PLANE (TAP) BLOCK - BILATERAL;  Surgeon: Sheldon Standing, MD;  Location: [facility]S;  Service: General;  Late   CYSTOSCOPY WITH INDOCYANINE GREEN  IMAGING (ICG) N/A 11/30/2023   Procedure: Cystoscopy with bilateral ureteral FireFly injections Transurethral resection of bladder tumor--large;  Surgeon: Carolee Sherwood JONETTA DOUGLAS, MD;  Location: [facility];  Service: Urology;  Laterality: N/A;   EXAM UNDER ANESTHESIA WITH MANIPULATION OF HIP Right 06/02/2014   dr sheril   FLEXIBLE SIGMOIDOSCOPY N/A 11/30/2023   Procedure: KINGSTON SIDE;  Surgeon: Sheldon Standing, MD;  Location: [facility];  Service: General;  Laterality: N/A;   FRACTURE SURGERY     HERNIA REPAIR     HIP CLOSED REDUCTION Right 06/02/2014   Procedure: CLOSED MANIPULATION HIP;  Surgeon: Maude KANDICE sheril, MD;  Location: [facility] OR;  Service: Orthopedics;  Laterality: Right;   IR FLUORO GUIDED NEEDLE PLC ASPIRATION/INJECTION LOC  08/09/2023   JOINT REPLACEMENT     JOINT REPLACEMENT     POSTERIOR LUMBAR FUSION 4 LEVEL N/A 06/26/2022   Procedure: Lumbar One To Lumbar Five Posterior Instrumented Fusion;  Surgeon: Cheryle Debby LABOR, MD;  Location: [facility] OR;  Service: Neurosurgery;  Laterality: N/A;   REFRACTIVE SURGERY Bilateral    SHOULDER ARTHROSCOPY Right    SHOULDER OPEN ROTATOR CUFF REPAIR Right    SPINAL FUSION  1996   t10 down to my coccyx   SPINE HARDWARE REMOVAL     TOTAL ABDOMINAL HYSTERECTOMY     TOTAL HIP ARTHROPLASTY Right 05/10/2014    [place]      by dr lonni olcott   TOTAL KNEE ARTHROPLASTY Left    TOTAL SHOULDER ARTHROPLASTY Left 08/19/2016   Procedure: TOTAL SHOULDER ARTHROPLASTY;  Surgeon: Eva Herring, MD;  Location: [facility] OR;  Service: Orthopedics;  Laterality: Left;  Left total shoulder replacement   Patient Active Problem List   Diagnosis Date Noted   Colovaginal fistula 11/30/2023   Diskitis 10/04/2023   Hypotension 09/06/2023   Fracture of tenth thoracic vertebra (HCC) 09/05/2023   History of discitis 09/05/2023   Senile osteoporosis 09/05/2023   History of urinary retention 09/05/2023   Vaginal atrophy 09/05/2023   Nocturia 09/05/2023   Vulvar ulcer 09/05/2023   Pulmonary nodule 08/02/2023   Non-traumatic compression fracture of vertebral column (HCC) 06/24/2023   Nonobstructive atherosclerosis of coronary artery 02/10/2023   Bilateral pseudophakia 02/03/2023   Vertebral fracture, osteoporotic (HCC) 01/06/2023   Ventriculitis of brain due to bacteria 11/02/2022   Anemia of chronic illness 10/12/2022   Bipolar disorder, in partial remission, most recent episode depressed (HCC) 10/07/2022   Adjustment disorder with mixed anxiety and depressed mood 09/30/2022   Chronic wound 09/25/2022   Hyperglycemia 09/25/2022   Chronic midline thoracic back pain 09/17/2022   Primary insomnia 09/17/2022   Chronic combined systolic and diastolic CHF (congestive heart failure) (HCC) 09/14/2022   Acute blood loss anemia 09/09/2022   Infection of lumbar spine (HCC) 09/03/2022   Malnutrition of moderate degree 08/20/2022   Hardware complicating wound infection (HCC) 08/20/2022   Infection of deep incisional surgical site after procedure 08/20/2022   Aspiration pneumonia of both lungs (HCC) 08/19/2022   Acute respiratory failure with hypoxia (HCC) 08/19/2022   Sepsis (HCC) 08/19/2022   Seizure (HCC) 08/19/2022   Wound dehiscence 08/09/2022   Delayed surgical wound healing 07/08/2022   Fracture of lumbar  spine without cord injury (HCC) 06/26/2022   Lumbar vertebral fracture (HCC) 06/25/2022   Mood disorder (HCC) 09/29/2021   Generalized muscle weakness 04/30/2019   Mild cognitive impairment 09/06/2018   Hyponatremia 02/28/2018   S/P shoulder replacement, left 08/19/2016   Anxiety 10/17/2014   Acid reflux 10/17/2014   BP (high blood pressure) 10/17/2014   Arthritis, degenerative 10/17/2014   Adult hypothyroidism 10/17/2014   UTI (urinary tract infection) 06/03/2014   Fracture of bone adjacent to prosthesis 06/03/2014   Peri-prosthetic fracture of femur following total hip arthroplasty 06/02/2014   Urinary retention 05/13/2014   Chronic pain 05/10/2014   History of hip surgery 05/10/2014   Osteoarthritis 09/30/2011   UNSPECIFIED HEART FAILURE 06/24/2010   UNSPECIFIED CONGENITAL DEFECT OF SEPTAL CLOSURE 06/24/2010   Secondary cardiomyopathy (HCC) 03/18/2010   Tobacco use 03/18/2010   DM 06/13/2009   HYPERTENSION, UNSPECIFIED 06/13/2009   VENTRICULAR TACHYCARDIA 06/13/2009   VENTRICULAR SEPTAL DEFECT, CONGENITAL 06/13/2009   Essential (primary) hypertension 06/13/2009  Diabetes mellitus type 2, noninsulin dependent (HCC) 06/13/2009    ONSET DATE: 01/05/2024 MD referral  REFERRING DIAG: R26.9 (ICD-10-CM) - Gait difficulty   THERAPY DIAG:  Unsteadiness on feet  Other abnormalities of gait and mobility  Muscle weakness (generalized)  Rationale for Evaluation and Treatment: Rehabilitation  SUBJECTIVE:                                                                                                                                                                                             SUBJECTIVE STATEMENT: My pain is some better today. She presents alone in transport chair - her aide is parking the car.   Pt accompanied by: self  PERTINENT HISTORY: Chronic osteomyelitis with worsening back pain; Hx of multiple R hip fractures with surgery to revisions, significant leg  length difference with LLE longer than RLE, hx of infection post surgery in spine (treated on antibiotics) Per MD Note 01/09/2024:    Patient was seen by Dr. Elspeth Schultze who scheduled her for surgery and on  12/01/2023 she underwent   -ROBOTIC LOW ANTERIOR RECTOSIGMOID RESECTION (LAR) WITH ANASTOMOSIS -COLOVAGINAL FISTULA TAKEDOWN & REPAIR -OMENTOPEXY OF VAGINA -INTRAOPERATIVE ASSESSMENT OF TISSUE VASCULAR PERFUSION USING ICG (indocyanine green ) IMMUNOFLUORESCENCE -TRANSVERSUS ABDOMINIS PLANE (TAP) BLOCK - BILATERAL -FLEXIBLE SIGMOIDOSCOPY    Urology did joint surgery and did    1.  Cystoscopy with bilateral ureteral FireFly injections 2.  Transurethral resection of  possible bladder tumor--large   Pathology came back with:   A. BLADDER TUMOR, TRANSURETHERAL RESECTON:  Florid cystitis cystica and papillary cystitis  Negative for carcinoma   B. RECTOSIGMOID COLON RESECTION:  Diverticula with chronic inflammation, perforation, fistula and reactive  fibrosis  Negative for carcinoma  Five benign lymph nodes (0/5)   C. FINAL DISTAL MARGIN:  Benign colonic tissue         PAIN:  Are you having pain? Yes: NPRS scale: 4/10 Pain location: R hip/back Pain description: throbbing, grinding, sharp Aggravating factors: more activities, [place] Relieving factors: rest  PRECAUTIONS: Fall and Other: Recent colectomy-pt reports no restrictions Pt has history of osteoporosis and compression fractures RED FLAGS: None   WEIGHT BEARING RESTRICTIONS: No  FALLS: Has patient fallen in last 6 months? No  LIVING ENVIRONMENT: Lives with: lives with their family Lives in: House/apartment Stairs: long ramp in/out of home Has following equipment at home: Vannie - 2 wheeled and Wheelchair (manual)  PLOF: Needs assistance with gait and Vocation/Vocational requirements: retired Runner, broadcasting/film/video, Tree surgeon; enjoys spending time with grandchildren in [place] and goes to [employer]  weekly  PATIENT GOALS: To get stronger, to get  back on a cane.  To use walker to get to the car (long ramp), get RLE into car better  OBJECTIVE:    TODAY'S TREATMENT: 03/20/2024 Activity Comments  Gait 4WW 50 ft x 2, indoors Gait 4WW, outdoor incline  Cues for technique to stay close to 4WW and use brakes for decline; to push through for foot clearance up the incline, with supervision  LAQ 3 x 10 reps, 3# BLE Seated march 3 x 10, 3# LLE Hamstring curl, green band 3 x10 Heel raises 2 x 10 reps   No weight added RLE  Reviewed (without band) core stability/posture exercises as part of HEP: shoulder horizontal adduction, paloff press position Cues for paloff press; no c/o pain              TODAY'S TREATMENT: 03/22/2024 Aquatic therapy at [facility] - pool temperature 92 degrees   Patient seen for aquatic therapy today.  Treatment took place in water  3.6-4.8 feet deep depending upon activity.  Patient entered and exited the pool via stairs using step to pattern w/ Bil rails at SBA level.   Exercises: -Water  walking warmup 4x18 ft forward > backward > 4x18 laterally unsupported SBA, pt requires prolonged seated recovery following lateral stepping (each lap) due to R hip fatigue and burning -Straddle sit on yellow noodle using BUE/BLE to paddle 6x18 ft in 4 ft depth; pt requires increased time to stretch L shoulder and briefly float for pain management of shoulders following task -Swing sit on yellow noodle - time spent finding upright balance > LAQ w/ brief hold at end range x3 minutes > Seated hip abduction/adduction x2 minutes - good righting responses > low flutter kicks (high lift irritates low back) x2 minutes > seated marches x1 minute (moderate ROM maintained) > lateral weight shifting x1 minute > added lateral reaching x1 minute -Using pink water  paddles for shoulder abduction/adduction x20, no pain > horizontal adduction x20; pt has shoulder fatigue requiring floating and seated rest  for pain management   Patient requires buoyancy of the water  for support for reduced fall risk with gait training and balance exercises with SBA support and verbal cuing and return demonstration. Exercises able to be performed safely in water  without the risk of fall compared to those same exercises performed on land; viscosity of water  needed for resistance for strengthening. Current of water  provides perturbations for challenging static and dynamic balance.   PATIENT EDUCATION: Education details:03/22/2024:  Aquatic rationale. Person educated: Patient Education method: Explanation Education comprehension: verbalized understanding and returned demonstration    Access Code: [email or web address] URL: [email or web address] Date: 02/02/2024 Prepared by: [facility] - Outpatient  Rehab - [facility]  Program Notes Seated leg slides out and in;  have a pillow case under your right foot-slide your foot out and in, 3 x 10 reps.  You can have a 3-5# ankle weight on your ankle  Exercises - Seated Hip Adduction Isometrics with Ball  - 1 x daily - 7 x weekly - 3 sets - 10 reps - Seated Hip Abduction with Resistance  - 1 x daily - 7 x weekly - 3 sets - 10 reps - Sit to Stand with Resistance Around Legs  - 1 x daily - 7 x weekly - 3 sets - 5 reps - Seated Shoulder Row with Anchored Resistance  - 1 x daily - 5 x weekly - 2 sets - 10 reps - Seated Anti-Rotation Press With Anchored Resistance  - 1 x daily - 7 x weekly -  3 sets - 10 reps    --------------------------------------------------- Note: Objective measures were completed at Evaluation unless otherwise noted.  DIAGNOSTIC FINDINGS: MRI 09/2023   IMPRESSION: 1. Postoperative changes of T11-S1 laminectomy and posterior spinal fusion with right posterior spinal instrumentation from L2-L5. On prior CT, there is evidence of fracture through the left posterior fusion mass at L4-5, correlating with edema is seen in the left pedicle of L4 on  today's study. 2. Decreased edema within the L4 and L5 vertebral bodies and posterior elements, as well as within the L4-5 and L3-4 discs, likely reactive and related to the above fracture. 3. Loculated simple fluid within the thecal sac with some marginalized cauda equina nerve roots, most consistent with arachnoiditis. 4. No spinal canal stenosis or neural foraminal narrowing.  COGNITION: Overall cognitive status: History of cognitive impairments - at baseline   SENSATION: Light touch: Impaired  and to light touch-see below Reports some numbness L foot  POSTURE: Leg length diffierence  LOWER EXTREMITY ROM:     Active  Right Eval Left Eval  Hip flexion    Hip extension    Hip abduction    Hip adduction    Hip internal rotation    Hip external rotation    Knee flexion    Knee extension    Ankle dorsiflexion    Ankle plantarflexion    Ankle inversion    Ankle eversion     (Blank rows = not tested)  LOWER EXTREMITY MMT:    MMT Right Eval Left Eval  Hip flexion 3- 3+  Hip extension    Hip abduction 4 4  Hip adduction 4 4  Hip internal rotation    Hip external rotation    Knee flexion 4 4  Knee extension 3+ 4  Ankle dorsiflexion 3+ 3+  Ankle plantarflexion    Ankle inversion    Ankle eversion    (Blank rows = not tested)   TRANSFERS: Sit to stand: SBA  Assistive device utilized: Environmental consultant - 2 wheeled     Stand to sit: SBA  Assistive device utilized: Environmental consultant - 2 wheeled      GAIT: Findings: Gait Characteristics: excess lateral weightshift through trunk due to significant leg length difference and step through pattern, Distance walked: 50 ft, Assistive device utilized:Walker - 2 wheeled, Level of assistance: CGA, and Comments: NA  FUNCTIONAL TESTS:  5 times sit to stand: 14.44 sec with BUE support Timed up and go (TUG): 24.53 sec 2 minute walk test: NT 10 meter walk test: 21.34 sec (1.54 ft/sec) TUG cognitive: (naming colors) 28.62 sec 23M walk back:  42.16  sec with RW Stroop gait:  13.25 sec in 10 ft (naming color words)-0.75 ft/sec   18.75 sec in 10 feet (naming printed colors)-0.53 ft/sec                                                                          PATIENT EDUCATION: Education details: See above. Person educated: Patient Education method: Explanation Education comprehension: verbalized understanding  HOME EXERCISE PROGRAM: Not yet initiated Did perform eccentric hip flexion/extension in sitting  GOALS: Goals reviewed with patient? Yes  SHORT TERM GOALS: Target date: 02/24/2024  Pt will be independent with HEP for improved  strength, balance, gait. Baseline: printed out and reviewed 02/23/24 Goal status: MET 02/23/24  2.  Pt will improve TUG score to less than or equal to 18 sec for decreased fall risk. Baseline: 24.53 sec; 14.54 sec with 4WW 02/23/24 Goal status: MET 02/23/24  3.  Pt will improve 3 M Walk backwards to less than or equal to 30 seconds for decreased fall risk. Baseline: 42.16 sec; 9.96 sec 02/23/24 Goal status: MET 02/23/24  4.  Pt will report at least 50% improvement in car transfers.  Baseline:  difficulty with RLE; Patient reports that she has been able to swing her LEs into the car by herself- reports 50% improvement. 02/23/24  Goal status:  MET 02/23/24  LONG TERM GOALS: Target date: 04/19/2024  Pt will be independent with HEP for improved strength, balance, gait. Baseline:  Goal status: IN PROGRESS  2.  Pt will improve gait velocity to at least 1.8 ft/sec for improved gait efficiency and safety. Baseline: 1.54 ft/sec Goal status: IN PROGRESS  3.  Pt will improve gait velocity with dual task to at least 1 ft/sec for improved gait and decreased fall risk. Baseline: 0.53-0.75 ft/sec with Stroop over 10 ft Goal status: IN PROGRESS  4.  Pt will improve 5x sit<>stand to less than or equal to 11.5 sec with light UE support to demonstrate improved functional strength and transfer efficiency. Baseline: 14.44  sec Goal status: IN PROGRESS  5.  Pt will verbalize plans for continued community fitness upon d/c from PT to maximize gains made in PT. Baseline:  Goal status: IN PROGRESS  6.  Pt will ambulate at least 200 ft in 2 minute walk, for improved gait efficiency and endurance.  Baseline: 100 ft in 1:30  Goal status:  IN PROGRESS    ASSESSMENT:  CLINICAL IMPRESSION: Pt presents for aquatic session tolerating techniques with less shoulder soreness this visit.  She has good righting responses noted with seated noodle tasks.  Her hip and low back continue to limit continuous activity but respond well to floating and seated recovery.  She continues to benefit from PT in this setting as compliment to land POC, pain management, and decreased fall risk with chronic imbalance.  Will continue per POC.   OBJECTIVE IMPAIRMENTS: Abnormal gait, decreased balance, decreased mobility, difficulty walking, decreased strength, impaired sensation, postural dysfunction, and pain.   ACTIVITY LIMITATIONS: sitting, standing, transfers, locomotion level, and caring for others  PARTICIPATION LIMITATIONS: driving, shopping, community activity, and travel  PERSONAL FACTORS: 3+ comorbidities: see PMH above are also affecting patient's functional outcome.   REHAB POTENTIAL: Good  CLINICAL DECISION MAKING: Evolving/moderate complexity  EVALUATION COMPLEXITY: Moderate  PLAN:  PT FREQUENCY: 2x/week  PT DURATION: 8 weeks plus eval visit  PLANNED INTERVENTIONS: [id number]- PT Re-evaluation, 97750- Physical Performance Testing, 97110-Therapeutic exercises, 97530- Therapeutic activity, [id number]- Neuromuscular re-education, 97535- Self Care, [id number]- Manual therapy, [id number]- Gait training, [phone]- Canalith repositioning, [id number]- Aquatic Therapy, Patient/Family education, and Balance training  PLAN FOR NEXT SESSION: Consolidate/update HEP and work to progress weights/band as pt tolerates.  How did trying ramp at home go?  Pt may bring  in her 4WW next visit; Ask about back pain and progress standing and gait tolerance; RLE strength for car transfers; pt requests not to perform Nustep and physioball activities d/t hip pain; pt reports that she would like to work towards being able to walk down her ramp and into her car-work on outdoor sidewalk distance to simulate ramp  AQUATICS: Frequency: 1x/week Duration: 4 weeks  Special Instruction: chronic hip and back pain, imbalance   Aquatics:  continue core work - try ankle weights/aquatic cuffs in sitting  Daved Bull, PT, DPT

## 2024-03-27 ENCOUNTER — Ambulatory Visit: Admitting: Physical Therapy

## 2024-03-27 ENCOUNTER — Encounter: Payer: Self-pay | Admitting: Physical Therapy

## 2024-03-27 DIAGNOSIS — M6281 Muscle weakness (generalized): Secondary | ICD-10-CM

## 2024-03-27 DIAGNOSIS — R2681 Unsteadiness on feet: Secondary | ICD-10-CM | POA: Diagnosis not present

## 2024-03-27 DIAGNOSIS — R2689 Other abnormalities of gait and mobility: Secondary | ICD-10-CM

## 2024-03-27 NOTE — Therapy (Signed)
 OUTPATIENT PHYSICAL THERAPY NEURO TREATMENT NOTE   Patient Name: [name] MRN: [id number] DOB:[birth year], 68 y.o., female Today's Date: 03/27/2024   PCP: Rolinda Millman, MD  REFERRING PROVIDER: Urbano Albright, MD    END OF SESSION:  PT End of Session - 03/27/24 1409     Visit Number 14    Number of Visits 17    Date for PT Re-Evaluation 04/19/24    Authorization Type BCBS Medicare    Progress Note Due on Visit 10    PT Start Time 1405    PT Stop Time 1445    PT Time Calculation (min) 40 min    Equipment Utilized During Treatment Other (comment)   aquatic devices as needed for safety and challenge - see note   Activity Tolerance Patient tolerated treatment well;Patient limited by pain    Behavior During Therapy WFL for tasks assessed/performed               Past Medical History:  Diagnosis Date   Anemia    Anxiety    Arthritis    Bipolar disorder (HCC)    Cardiomyopathy (HCC)    Complication of anesthesia    Awake but could not move   Depression    Diskitis 10/04/2023   GERD (gastroesophageal reflux disease)    Heart failure (HCC)    Acute   Heart murmur    related to VSD   High cholesterol    History of blood transfusion    related to OR (08/19/2016)   History of hiatal hernia    Hypertension    Hyperthyroidism    Hypotension 09/06/2023   Mild cognitive impairment 09/06/2018   Paroxysmal ventricular tachycardia (HCC)    Pneumonia    Rectovaginal fistula 09/06/2023   Tachycardia, unspecified    Type II diabetes mellitus (HCC)    UTI (urinary tract infection)    being treated with Keflex    Ventricular septal defect    Ventriculitis of brain due to bacteria 11/02/2022   Vertebral fracture, osteoporotic (HCC) 01/06/2023   Past Surgical History:  Procedure Laterality Date   ABDOMINAL HYSTERECTOMY     BACK SURGERY     CARDIAC CATHETERIZATION N/A 06/23/2015   Procedure: Left Heart Cath and Coronary Angiography;  Surgeon: Ezra GORMAN Shuck, MD;  Location: [facility] INVASIVE CV LAB;  Service: Cardiovascular;  Laterality: N/A;   CARDIAC CATHETERIZATION  1960   VSD was so small; didn't need repaired   COLECTOMY, SIGMOID, ROBOT-ASSISTED N/A 11/30/2023   Procedure: COLECTOMY, SIGMOID, ROBOT-ASSISTED ROBOTIC LOW ANTERIOR RECTOSIGMOID RESECTION (LAR) WITH ANASTOMOSIS COLOVAGINAL FISTULA TAKEDOWN & REPAIR OMENTOPEXY OF VAGINA INTRAOPERATIVE ASSESSMENT OF TISSUE VASCULAR PERFUSION USING ICG (indocyanine green ) IMMUNOFLUORESCENCE TRANSVERSUS ABDOMINIS PLANE (TAP) BLOCK - BILATERAL;  Surgeon: Sheldon Standing, MD;  Location: [facility] ORS;  Service: General;  Late   CYSTOSCOPY WITH INDOCYANINE GREEN  IMAGING (ICG) N/A 11/30/2023   Procedure: Cystoscopy with bilateral ureteral FireFly injections Transurethral resection of bladder tumor--large;  Surgeon: Carolee Sherwood JONETTA DOUGLAS, MD;  Location: [facility] OR[facility];  Service: Urology;  Laterality: N/A;   EXAM UNDER ANESTHESIA WITH MANIPULATION OF HIP Right 06/02/2014   dr sheril   FLEXIBLE SIGMOIDOSCOPY N/A 11/30/2023   Procedure: KINGSTON SIDE;  Surgeon: Sheldon Standing, MD;  Location: [facility] OR[facility];  Service: General;  Laterality: N/A;   FRACTURE SURGERY     HERNIA REPAIR     HIP CLOSED REDUCTION Right 06/02/2014   Procedure: CLOSED MANIPULATION HIP;  Surgeon: Maude KANDICE sheril, MD;  Location: [facility] OR;  Service: Orthopedics;  Laterality: Right;   IR FLUORO GUIDED NEEDLE PLC ASPIRATION/INJECTION LOC  08/09/2023   JOINT REPLACEMENT     JOINT REPLACEMENT     POSTERIOR LUMBAR FUSION 4 LEVEL N/A 06/26/2022   Procedure: Lumbar One To Lumbar Five Posterior Instrumented Fusion;  Surgeon: Cheryle Debby LABOR, MD;  Location: [facility] OR;  Service: Neurosurgery;  Laterality: N/A;   REFRACTIVE SURGERY Bilateral    SHOULDER ARTHROSCOPY Right    SHOULDER OPEN ROTATOR CUFF REPAIR Right    SPINAL FUSION  1996   t10 down to my coccyx   SPINE HARDWARE REMOVAL     TOTAL ABDOMINAL HYSTERECTOMY     TOTAL HIP ARTHROPLASTY Right 05/10/2014    [place]      by dr lonni olcott   TOTAL KNEE ARTHROPLASTY Left    TOTAL SHOULDER ARTHROPLASTY Left 08/19/2016   Procedure: TOTAL SHOULDER ARTHROPLASTY;  Surgeon: Eva Herring, MD;  Location: [facility] OR;  Service: Orthopedics;  Laterality: Left;  Left total shoulder replacement   Patient Active Problem List   Diagnosis Date Noted   Colovaginal fistula 11/30/2023   Diskitis 10/04/2023   Hypotension 09/06/2023   Fracture of tenth thoracic vertebra (HCC) 09/05/2023   History of discitis 09/05/2023   Senile osteoporosis 09/05/2023   History of urinary retention 09/05/2023   Vaginal atrophy 09/05/2023   Nocturia 09/05/2023   Vulvar ulcer 09/05/2023   Pulmonary nodule 08/02/2023   Non-traumatic compression fracture of vertebral column (HCC) 06/24/2023   Nonobstructive atherosclerosis of coronary artery 02/10/2023   Bilateral pseudophakia 02/03/2023   Vertebral fracture, osteoporotic (HCC) 01/06/2023   Ventriculitis of brain due to bacteria 11/02/2022   Anemia of chronic illness 10/12/2022   Bipolar disorder, in partial remission, most recent episode depressed (HCC) 10/07/2022   Adjustment disorder with mixed anxiety and depressed mood 09/30/2022   Chronic wound 09/25/2022   Hyperglycemia 09/25/2022   Chronic midline thoracic back pain 09/17/2022   Primary insomnia 09/17/2022   Chronic combined systolic and diastolic CHF (congestive heart failure) (HCC) 09/14/2022   Acute blood loss anemia 09/09/2022   Infection of lumbar spine (HCC) 09/03/2022   Malnutrition of moderate degree 08/20/2022   Hardware complicating wound infection (HCC) 08/20/2022   Infection of deep incisional surgical site after procedure 08/20/2022   Aspiration pneumonia of both lungs (HCC) 08/19/2022   Acute respiratory failure with hypoxia (HCC) 08/19/2022   Sepsis (HCC) 08/19/2022   Seizure (HCC) 08/19/2022   Wound dehiscence 08/09/2022   Delayed surgical wound healing 07/08/2022   Fracture of lumbar  spine without cord injury (HCC) 06/26/2022   Lumbar vertebral fracture (HCC) 06/25/2022   Mood disorder (HCC) 09/29/2021   Generalized muscle weakness 04/30/2019   Mild cognitive impairment 09/06/2018   Hyponatremia 02/28/2018   S/P shoulder replacement, left 08/19/2016   Anxiety 10/17/2014   Acid reflux 10/17/2014   BP (high blood pressure) 10/17/2014   Arthritis, degenerative 10/17/2014   Adult hypothyroidism 10/17/2014   UTI (urinary tract infection) 06/03/2014   Fracture of bone adjacent to prosthesis 06/03/2014   Peri-prosthetic fracture of femur following total hip arthroplasty 06/02/2014   Urinary retention 05/13/2014   Chronic pain 05/10/2014   History of hip surgery 05/10/2014   Osteoarthritis 09/30/2011   UNSPECIFIED HEART FAILURE 06/24/2010   UNSPECIFIED CONGENITAL DEFECT OF SEPTAL CLOSURE 06/24/2010   Secondary cardiomyopathy (HCC) 03/18/2010   Tobacco use 03/18/2010   DM 06/13/2009   HYPERTENSION, UNSPECIFIED 06/13/2009   VENTRICULAR TACHYCARDIA 06/13/2009   VENTRICULAR SEPTAL DEFECT, CONGENITAL 06/13/2009   Essential (primary) hypertension 06/13/2009  Diabetes mellitus type 2, noninsulin dependent (HCC) 06/13/2009    ONSET DATE: 01/05/2024 MD referral  REFERRING DIAG: R26.9 (ICD-10-CM) - Gait difficulty   THERAPY DIAG:  Unsteadiness on feet  Other abnormalities of gait and mobility  Muscle weakness (generalized)  Rationale for Evaluation and Treatment: Rehabilitation  SUBJECTIVE:                                                                                                                                                                                             SUBJECTIVE STATEMENT: Want to walk in from the lobby today.  Would like to work with the cane.  Have some canes at home and want to make sure they are the correct height. Pt accompanied by: self  PERTINENT HISTORY: Chronic osteomyelitis with worsening back pain; Hx of multiple R hip fractures  with surgery to revisions, significant leg length difference with LLE longer than RLE, hx of infection post surgery in spine (treated on antibiotics) Per MD Note 01/09/2024:    Patient was seen by Dr. Elspeth Schultze who scheduled her for surgery and on  12/01/2023 she underwent   -ROBOTIC LOW ANTERIOR RECTOSIGMOID RESECTION (LAR) WITH ANASTOMOSIS -COLOVAGINAL FISTULA TAKEDOWN & REPAIR -OMENTOPEXY OF VAGINA -INTRAOPERATIVE ASSESSMENT OF TISSUE VASCULAR PERFUSION USING ICG (indocyanine green ) IMMUNOFLUORESCENCE -TRANSVERSUS ABDOMINIS PLANE (TAP) BLOCK - BILATERAL -FLEXIBLE SIGMOIDOSCOPY    Urology did joint surgery and did    1.  Cystoscopy with bilateral ureteral FireFly injections 2.  Transurethral resection of  possible bladder tumor--large   Pathology came back with:   A. BLADDER TUMOR, TRANSURETHERAL RESECTON:  Florid cystitis cystica and papillary cystitis  Negative for carcinoma   B. RECTOSIGMOID COLON RESECTION:  Diverticula with chronic inflammation, perforation, fistula and reactive  fibrosis  Negative for carcinoma  Five benign lymph nodes (0/5)   C. FINAL DISTAL MARGIN:  Benign colonic tissue         PAIN:  Are you having pain? Yes: NPRS scale: 5/10 Pain location: R hip/back Pain description: throbbing, grinding, sharp Aggravating factors: more activities[place] Relieving factors: rest  PRECAUTIONS: Fall and Other: Recent colectomy-pt reports no restrictions Pt has history of osteoporosis and compression fractures RED FLAGS: None   WEIGHT BEARING RESTRICTIONS: No  FALLS: Has patient fallen in last 6 months? No  LIVING ENVIRONMENT: Lives with: lives with their family Lives in: House/apartment Stairs: long ramp in/out of home Has following equipment at home: Vannie - 2 wheeled and Wheelchair (manual)  PLOF: Needs assistance with gait and Vocation/Vocational requirements: retired Runner, broadcasting/film/video, Tree surgeon; enjoys spending time with  grandchildren in [place] and [place]  PATIENT GOALS: To get stronger, to get back on a cane.  To use walker to get to the car (long ramp), get RLE into car better  OBJECTIVE:    TODAY'S TREATMENT: 03/27/2024 Activity Comments  Gait 4WW: 50 ft supervision  Gait with SPC, 54ft x 2 Gait with SPC, 60 ft Supervision-tried varied heights, using cane in R hand; cane is 31.5 in height  SAQ, 3 x 10 reps 2#, then 4# RLE  Quad sets, RLE 2 x 10 reps    Hamstring sets BLE, 2 x 10 reps, supine   Seated hamstring curls, green band 3 x 10 reps   Seated hip abduction green band, 10 reps   Sit to stand green band at thighs 5 reps    Access Code: [email or web address] URL: [email or web address] Date: 03/27/2024 Prepared by: [facility] - Outpatient  Rehab - [facility]  Program Notes Seated leg slides out and in;  have a pillow case under your right foot-slide your foot out and in, 3 x 10 reps.  You can have a 3-5# ankle weight on your ankle  Exercises - Seated Hip Adduction Isometrics with Ball  - 1 x daily - 7 x weekly - 3 sets - 10 reps - Seated Hip Abduction with Resistance  - 1 x daily - 7 x weekly - 3 sets - 10 reps - Sit to Stand with Resistance Around Legs  - 1 x daily - 7 x weekly - 3 sets - 5 reps - Seated Shoulder Row with Anchored Resistance  - 1 x daily - 5 x weekly - 2 sets - 10 reps - Seated Anti-Rotation Press With Anchored Resistance  - 1 x daily - 7 x weekly - 3 sets - 10 reps - Seated Hamstring Curl with Anchored Resistance  - 1 x daily - 4 x weekly - 3 sets - 10 reps - Supine Quad Set  - 1 x daily - 5 x weekly - 3 sets - 10 reps - 3 sec hold - Short Arc Quad with Ankle Weight  - 1 x daily - 4 x weekly - 3 sets - 10 reps  PATIENT EDUCATION: Education details: *** Person educated: {Person educated:25204} Education method: {Education Method:25205} Education comprehension: {Education Comprehension:25206}   TODAY'S TREATMENT: 03/20/2024 Activity Comments   Gait 4WW 50 ft x 2, indoors Gait 4WW, outdoor incline  Cues for technique to stay close to 4WW and use brakes for decline; to push through for foot clearance up the incline, with supervision  LAQ 3 x 10 reps, 3# BLE Seated march 3 x 10, 3# LLE Hamstring curl, green band 3 x10 Heel raises 2 x 10 reps   No weight added RLE  Reviewed (without band) core stability/posture exercises as part of HEP: shoulder horizontal adduction, paloff press position Cues for paloff press; no c/o pain              TODAY'S TREATMENT: 03/22/2024 Aquatic therapy at [facility] - pool temperature 92 degrees   Patient seen for aquatic therapy today.  Treatment took place in water  3.6-4.8 feet deep depending upon activity.  Patient entered and exited the pool via stairs using step to pattern w/ Bil rails at SBA level.   Exercises: -Water  walking warmup 4x18 ft forward > backward > 4x18 laterally unsupported SBA, pt requires prolonged seated recovery following lateral stepping (each lap) due to R hip fatigue and burning -Straddle sit on yellow noodle using BUE/BLE to paddle 6x18 ft in 4  ft depth; pt requires increased time to stretch L shoulder and briefly float for pain management of shoulders following task -Swing sit on yellow noodle - time spent finding upright balance > LAQ w/ brief hold at end range x3 minutes > Seated hip abduction/adduction x2 minutes - good righting responses > low flutter kicks (high lift irritates low back) x2 minutes > seated marches x1 minute (moderate ROM maintained) > lateral weight shifting x1 minute > added lateral reaching x1 minute -Using pink water  paddles for shoulder abduction/adduction x20, no pain > horizontal adduction x20; pt has shoulder fatigue requiring floating and seated rest for pain management   Patient requires buoyancy of the water  for support for reduced fall risk with gait training and balance exercises with SBA support and verbal cuing and return demonstration.  Exercises able to be performed safely in water  without the risk of fall compared to those same exercises performed on land; viscosity of water  needed for resistance for strengthening. Current of water  provides perturbations for challenging static and dynamic balance.   PATIENT EDUCATION: Education details:03/22/2024:  Aquatic rationale. Person educated: Patient Education method: Explanation Education comprehension: verbalized understanding and returned demonstration    Access Code: [email or web address] URL: [email or web address] Date: 02/02/2024 Prepared by: [facility] - Outpatient  Rehab - [facility]  Program Notes Seated leg slides out and in;  have a pillow case under your right foot-slide your foot out and in, 3 x 10 reps.  You can have a 3-5# ankle weight on your ankle  Exercises - Seated Hip Adduction Isometrics with Ball  - 1 x daily - 7 x weekly - 3 sets - 10 reps - Seated Hip Abduction with Resistance  - 1 x daily - 7 x weekly - 3 sets - 10 reps - Sit to Stand with Resistance Around Legs  - 1 x daily - 7 x weekly - 3 sets - 5 reps - Seated Shoulder Row with Anchored Resistance  - 1 x daily - 5 x weekly - 2 sets - 10 reps - Seated Anti-Rotation Press With Anchored Resistance  - 1 x daily - 7 x weekly - 3 sets - 10 reps    --------------------------------------------------- Note: Objective measures were completed at Evaluation unless otherwise noted.  DIAGNOSTIC FINDINGS: MRI 09/2023   IMPRESSION: 1. Postoperative changes of T11-S1 laminectomy and posterior spinal fusion with right posterior spinal instrumentation from L2-L5. On prior CT, there is evidence of fracture through the left posterior fusion mass at L4-5, correlating with edema is seen in the left pedicle of L4 on today's study. 2. Decreased edema within the L4 and L5 vertebral bodies and posterior elements, as well as within the L4-5 and L3-4 discs, likely reactive and related to the above fracture. 3.  Loculated simple fluid within the thecal sac with some marginalized cauda equina nerve roots, most consistent with arachnoiditis. 4. No spinal canal stenosis or neural foraminal narrowing.  COGNITION: Overall cognitive status: History of cognitive impairments - at baseline   SENSATION: Light touch: Impaired  and to light touch-see below Reports some numbness L foot  POSTURE: Leg length diffierence  LOWER EXTREMITY ROM:     Active  Right Eval Left Eval  Hip flexion    Hip extension    Hip abduction    Hip adduction    Hip internal rotation    Hip external rotation    Knee flexion    Knee extension    Ankle dorsiflexion    Ankle plantarflexion  Ankle inversion    Ankle eversion     (Blank rows = not tested)  LOWER EXTREMITY MMT:    MMT Right Eval Left Eval  Hip flexion 3- 3+  Hip extension    Hip abduction 4 4  Hip adduction 4 4  Hip internal rotation    Hip external rotation    Knee flexion 4 4  Knee extension 3+ 4  Ankle dorsiflexion 3+ 3+  Ankle plantarflexion    Ankle inversion    Ankle eversion    (Blank rows = not tested)   TRANSFERS: Sit to stand: SBA  Assistive device utilized: Environmental consultant - 2 wheeled     Stand to sit: SBA  Assistive device utilized: Environmental consultant - 2 wheeled      GAIT: Findings: Gait Characteristics: excess lateral weightshift through trunk due to significant leg length difference and step through pattern, Distance walked: 50 ft, Assistive device utilized:Walker - 2 wheeled, Level of assistance: CGA, and Comments: NA  FUNCTIONAL TESTS:  5 times sit to stand: 14.44 sec with BUE support Timed up and go (TUG): 24.53 sec 2 minute walk test: NT 10 meter walk test: 21.34 sec (1.54 ft/sec) TUG cognitive: (naming colors) 28.62 sec 59M walk back:  42.16 sec with RW Stroop gait:  13.25 sec in 10 ft (naming color words)-0.75 ft/sec   18.75 sec in 10 feet (naming printed colors)-0.53 ft/sec                                                                           PATIENT EDUCATION: Education details: See above. Person educated: Patient Education method: Explanation Education comprehension: verbalized understanding  HOME EXERCISE PROGRAM: Not yet initiated Did perform eccentric hip flexion/extension in sitting  GOALS: Goals reviewed with patient? Yes  SHORT TERM GOALS: Target date: 02/24/2024  Pt will be independent with HEP for improved strength, balance, gait. Baseline: printed out and reviewed 02/23/24 Goal status: MET 02/23/24  2.  Pt will improve TUG score to less than or equal to 18 sec for decreased fall risk. Baseline: 24.53 sec; 14.54 sec with 4WW 02/23/24 Goal status: MET 02/23/24  3.  Pt will improve 3 M Walk backwards to less than or equal to 30 seconds for decreased fall risk. Baseline: 42.16 sec; 9.96 sec 02/23/24 Goal status: MET 02/23/24  4.  Pt will report at least 50% improvement in car transfers.  Baseline:  difficulty with RLE; Patient reports that she has been able to swing her LEs into the car by herself- reports 50% improvement. 02/23/24  Goal status:  MET 02/23/24  LONG TERM GOALS: Target date: 04/19/2024  Pt will be independent with HEP for improved strength, balance, gait. Baseline:  Goal status: IN PROGRESS  2.  Pt will improve gait velocity to at least 1.8 ft/sec for improved gait efficiency and safety. Baseline: 1.54 ft/sec Goal status: IN PROGRESS  3.  Pt will improve gait velocity with dual task to at least 1 ft/sec for improved gait and decreased fall risk. Baseline: 0.53-0.75 ft/sec with Stroop over 10 ft Goal status: IN PROGRESS  4.  Pt will improve 5x sit<>stand to less than or equal to 11.5 sec with light UE support to demonstrate improved functional strength and  transfer efficiency. Baseline: 14.44 sec Goal status: IN PROGRESS  5.  Pt will verbalize plans for continued community fitness upon d/c from PT to maximize gains made in PT. Baseline:  Goal status: IN PROGRESS  6.  Pt will  ambulate at least 200 ft in 2 minute walk, for improved gait efficiency and endurance.  Baseline: 100 ft in 1:30  Goal status:  IN PROGRESS    ASSESSMENT:  CLINICAL IMPRESSION: Pt presents today ***. Skilled PT session focused on ***. Pt needs ***. Pt will continue to benefit from skilled PT towards goals for improved functional mobility and decreased fall risk.   Pt presents for aquatic session tolerating techniques with less shoulder soreness this visit.  She has good righting responses noted with seated noodle tasks.  Her hip and low back continue to limit continuous activity but respond well to floating and seated recovery.  She continues to benefit from PT in this setting as compliment to land POC, pain management, and decreased fall risk with chronic imbalance.  Will continue per POC.   OBJECTIVE IMPAIRMENTS: Abnormal gait, decreased balance, decreased mobility, difficulty walking, decreased strength, impaired sensation, postural dysfunction, and pain.   ACTIVITY LIMITATIONS: sitting, standing, transfers, locomotion level, and caring for others  PARTICIPATION LIMITATIONS: driving, shopping, community activity, and travel  PERSONAL FACTORS: 3+ comorbidities: see PMH above are also affecting patient's functional outcome.   REHAB POTENTIAL: Good  CLINICAL DECISION MAKING: Evolving/moderate complexity  EVALUATION COMPLEXITY: Moderate  PLAN:  PT FREQUENCY: 2x/week  PT DURATION: 8 weeks plus eval visit  PLANNED INTERVENTIONS: [id number]- PT Re-evaluation, 97750- Physical Performance Testing, 97110-Therapeutic exercises, 97530- Therapeutic activity, [id number]- Neuromuscular re-education, 97535- Self Care, [id number]- Manual therapy, [id number]- Gait training, [phone]- Canalith repositioning, [id number]- Aquatic Therapy, Patient/Family education, and Balance training  PLAN FOR NEXT SESSION: ***Consolidate/update HEP and work to progress weights/band as pt tolerates.  How did trying ramp at home go?  Pt may  bring in her 4WW next visit; Ask about back pain and progress standing and gait tolerance; RLE strength for car transfers; pt requests not to perform Nustep and physioball activities d/t hip pain; pt reports that she would like to work towards being able to walk down her ramp and into her car-work on outdoor sidewalk distance to simulate ramp  AQUATICS: Frequency: 1x/week Duration: 4 weeks  Special Instruction: chronic hip and back pain, imbalance   Aquatics:  continue core work - try ankle weights/aquatic cuffs in sitting  Daved Bull, PT, DPT

## 2024-03-29 ENCOUNTER — Ambulatory Visit: Admitting: Physical Therapy

## 2024-04-02 ENCOUNTER — Ambulatory Visit: Admitting: Physical Therapy

## 2024-04-03 ENCOUNTER — Ambulatory Visit: Admitting: Physical Therapy

## 2024-04-03 ENCOUNTER — Encounter: Payer: Self-pay | Admitting: Rehabilitation

## 2024-04-03 ENCOUNTER — Ambulatory Visit: Admitting: Rehabilitation

## 2024-04-03 NOTE — Therapy (Deleted)
 OUTPATIENT PHYSICAL THERAPY NEURO TREATMENT NOTE   Patient Name: [name] MRN: [id number] DOB:August 10, 1955, 68 y.o., female Today's Date: 04/03/2024   PCP: Rolinda Millman, MD  REFERRING PROVIDER: Urbano Albright, MD    END OF SESSION:  PT End of Session - 04/03/24 0829     Visit Number 15    Number of Visits 17    Date for PT Re-Evaluation 04/19/24    Authorization Type BCBS Medicare    Progress Note Due on Visit 10    Equipment Utilized During Treatment --   floatation devices as needed for safety   Activity Tolerance Patient tolerated treatment well;Patient limited by pain    Behavior During Therapy [facility] for tasks assessed/performed              Past Medical History:  Diagnosis Date   Anemia    Anxiety    Arthritis    Bipolar disorder (HCC)    Cardiomyopathy (HCC)    Complication of anesthesia    Awake but could not move   Depression    Diskitis 10/04/2023   GERD (gastroesophageal reflux disease)    Heart failure (HCC)    Acute   Heart murmur    related to VSD   High cholesterol    History of blood transfusion    related to OR (08/19/2016)   History of hiatal hernia    Hypertension    Hyperthyroidism    Hypotension 09/06/2023   Mild cognitive impairment 09/06/2018   Paroxysmal ventricular tachycardia (HCC)    Pneumonia    Rectovaginal fistula 09/06/2023   Tachycardia, unspecified    Type II diabetes mellitus (HCC)    UTI (urinary tract infection)    being treated with Keflex    Ventricular septal defect    Ventriculitis of brain due to bacteria 11/02/2022   Vertebral fracture, osteoporotic (HCC) 01/06/2023   Past Surgical History:  Procedure Laterality Date   ABDOMINAL HYSTERECTOMY     BACK SURGERY     CARDIAC CATHETERIZATION N/A 06/23/2015   Procedure: Left Heart Cath and Coronary Angiography;  Surgeon: Ezra GORMAN Shuck, MD;  Location: [facility] INVASIVE CV LAB;  Service: Cardiovascular;  Laterality: N/A;   CARDIAC CATHETERIZATION  1960    VSD was so small; didn't need repaired   COLECTOMY, SIGMOID, ROBOT-ASSISTED N/A 11/30/2023   Procedure: COLECTOMY, SIGMOID, ROBOT-ASSISTED ROBOTIC LOW ANTERIOR RECTOSIGMOID RESECTION (LAR) WITH ANASTOMOSIS COLOVAGINAL FISTULA TAKEDOWN & REPAIR OMENTOPEXY OF VAGINA INTRAOPERATIVE ASSESSMENT OF TISSUE VASCULAR PERFUSION USING ICG (indocyanine green ) IMMUNOFLUORESCENCE TRANSVERSUS ABDOMINIS PLANE (TAP) BLOCK - BILATERAL;  Surgeon: Sheldon Standing, MD;  Location: [facility] ORS;  Service: General;  Late   CYSTOSCOPY WITH INDOCYANINE GREEN  IMAGING (ICG) N/A 11/30/2023   Procedure: Cystoscopy with bilateral ureteral FireFly injections Transurethral resection of bladder tumor--large;  Surgeon: Carolee Sherwood JONETTA DOUGLAS, MD;  Location: [facility] ORS;  Service: Urology;  Laterality: N/A;   EXAM UNDER ANESTHESIA WITH MANIPULATION OF HIP Right 06/02/2014   dr sheril   FLEXIBLE SIGMOIDOSCOPY N/A 11/30/2023   Procedure: KINGSTON SIDE;  Surgeon: Sheldon Standing, MD;  Location: [facility] ORS;  Service: General;  Laterality: N/A;   FRACTURE SURGERY     HERNIA REPAIR     HIP CLOSED REDUCTION Right 06/02/2014   Procedure: CLOSED MANIPULATION HIP;  Surgeon: Maude KANDICE sheril, MD;  Location: [facility] OR;  Service: Orthopedics;  Laterality: Right;   IR FLUORO GUIDED NEEDLE PLC ASPIRATION/INJECTION LOC  08/09/2023   JOINT REPLACEMENT     JOINT REPLACEMENT     POSTERIOR LUMBAR  FUSION 4 LEVEL N/A 06/26/2022   Procedure: Lumbar One To Lumbar Five Posterior Instrumented Fusion;  Surgeon: Cheryle Debby LABOR, MD;  Location: [facility] OR;  Service: Neurosurgery;  Laterality: N/A;   REFRACTIVE SURGERY Bilateral    SHOULDER ARTHROSCOPY Right    SHOULDER OPEN ROTATOR CUFF REPAIR Right    SPINAL FUSION  1996   t10 down to my coccyx   SPINE HARDWARE REMOVAL     TOTAL ABDOMINAL HYSTERECTOMY     TOTAL HIP ARTHROPLASTY Right 05/10/2014   [place]      by dr lonni olcott   TOTAL KNEE ARTHROPLASTY Left    TOTAL SHOULDER ARTHROPLASTY Left 08/19/2016    Procedure: TOTAL SHOULDER ARTHROPLASTY;  Surgeon: Eva Herring, MD;  Location: [facility] OR;  Service: Orthopedics;  Laterality: Left;  Left total shoulder replacement   Patient Active Problem List   Diagnosis Date Noted   Colovaginal fistula 11/30/2023   Diskitis 10/04/2023   Hypotension 09/06/2023   Fracture of tenth thoracic vertebra (HCC) 09/05/2023   History of discitis 09/05/2023   Senile osteoporosis 09/05/2023   History of urinary retention 09/05/2023   Vaginal atrophy 09/05/2023   Nocturia 09/05/2023   Vulvar ulcer 09/05/2023   Pulmonary nodule 08/02/2023   Non-traumatic compression fracture of vertebral column (HCC) 06/24/2023   Nonobstructive atherosclerosis of coronary artery 02/10/2023   Bilateral pseudophakia 02/03/2023   Vertebral fracture, osteoporotic (HCC) 01/06/2023   Ventriculitis of brain due to bacteria 11/02/2022   Anemia of chronic illness 10/12/2022   Bipolar disorder, in partial remission, most recent episode depressed (HCC) 10/07/2022   Adjustment disorder with mixed anxiety and depressed mood 09/30/2022   Chronic wound 09/25/2022   Hyperglycemia 09/25/2022   Chronic midline thoracic back pain 09/17/2022   Primary insomnia 09/17/2022   Chronic combined systolic and diastolic CHF (congestive heart failure) (HCC) 09/14/2022   Acute blood loss anemia 09/09/2022   Infection of lumbar spine (HCC) 09/03/2022   Malnutrition of moderate degree 08/20/2022   Hardware complicating wound infection (HCC) 08/20/2022   Infection of deep incisional surgical site after procedure 08/20/2022   Aspiration pneumonia of both lungs (HCC) 08/19/2022   Acute respiratory failure with hypoxia (HCC) 08/19/2022   Sepsis (HCC) 08/19/2022   Seizure (HCC) 08/19/2022   Wound dehiscence 08/09/2022   Delayed surgical wound healing 07/08/2022   Fracture of lumbar spine without cord injury (HCC) 06/26/2022   Lumbar vertebral fracture (HCC) 06/25/2022   Mood disorder (HCC) 09/29/2021    Generalized muscle weakness 04/30/2019   Mild cognitive impairment 09/06/2018   Hyponatremia 02/28/2018   S/P shoulder replacement, left 08/19/2016   Anxiety 10/17/2014   Acid reflux 10/17/2014   BP (high blood pressure) 10/17/2014   Arthritis, degenerative 10/17/2014   Adult hypothyroidism 10/17/2014   UTI (urinary tract infection) 06/03/2014   Fracture of bone adjacent to prosthesis 06/03/2014   Peri-prosthetic fracture of femur following total hip arthroplasty 06/02/2014   Urinary retention 05/13/2014   Chronic pain 05/10/2014   History of hip surgery 05/10/2014   Osteoarthritis 09/30/2011   UNSPECIFIED HEART FAILURE 06/24/2010   UNSPECIFIED CONGENITAL DEFECT OF SEPTAL CLOSURE 06/24/2010   Secondary cardiomyopathy (HCC) 03/18/2010   Tobacco use 03/18/2010   DM 06/13/2009   HYPERTENSION, UNSPECIFIED 06/13/2009   VENTRICULAR TACHYCARDIA 06/13/2009   VENTRICULAR SEPTAL DEFECT, CONGENITAL 06/13/2009   Essential (primary) hypertension 06/13/2009   Diabetes mellitus type 2, noninsulin dependent (HCC) 06/13/2009    ONSET DATE: 01/05/2024 MD referral  REFERRING DIAG: R26.9 (ICD-10-CM) - Gait difficulty   THERAPY  DIAG:  Unsteadiness on feet  Other abnormalities of gait and mobility  Muscle weakness (generalized)  Rationale for Evaluation and Treatment: Rehabilitation  SUBJECTIVE:                                                                                                                                                                                             SUBJECTIVE STATEMENT: My pain is some better today. She presents alone in transport chair - her aide is parking the car.   Pt accompanied by: self  PERTINENT HISTORY: Chronic osteomyelitis with worsening back pain; Hx of multiple R hip fractures with surgery to revisions, significant leg length difference with LLE longer than RLE, hx of infection post surgery in spine (treated on antibiotics) Per MD Note  01/09/2024:    Patient was seen by Dr. Elspeth Schultze who scheduled her for surgery and on  12/01/2023 she underwent   -ROBOTIC LOW ANTERIOR RECTOSIGMOID RESECTION (LAR) WITH ANASTOMOSIS -COLOVAGINAL FISTULA TAKEDOWN & REPAIR -OMENTOPEXY OF VAGINA -INTRAOPERATIVE ASSESSMENT OF TISSUE VASCULAR PERFUSION USING ICG (indocyanine green ) IMMUNOFLUORESCENCE -TRANSVERSUS ABDOMINIS PLANE (TAP) BLOCK - BILATERAL -FLEXIBLE SIGMOIDOSCOPY    Urology did joint surgery and did    1.  Cystoscopy with bilateral ureteral FireFly injections 2.  Transurethral resection of  possible bladder tumor--large   Pathology came back with:   A. BLADDER TUMOR, TRANSURETHERAL RESECTON:  Florid cystitis cystica and papillary cystitis  Negative for carcinoma   B. RECTOSIGMOID COLON RESECTION:  Diverticula with chronic inflammation, perforation, fistula and reactive  fibrosis  Negative for carcinoma  Five benign lymph nodes (0/5)   C. FINAL DISTAL MARGIN:  Benign colonic tissue         PAIN:  Are you having pain? Yes: NPRS scale: 4/10 Pain location: R hip/back Pain description: throbbing, grinding, sharp Aggravating factors: more activities, [place] Relieving factors: rest  PRECAUTIONS: Fall and Other: Recent colectomy-pt reports no restrictions Pt has history of osteoporosis and compression fractures RED FLAGS: None   WEIGHT BEARING RESTRICTIONS: No  FALLS: Has patient fallen in last 6 months? No  LIVING ENVIRONMENT: Lives with: lives with their family Lives in: House/apartment Stairs: long ramp in/out of home Has following equipment at home: Vannie - 2 wheeled and Wheelchair (manual)  PLOF: Needs assistance with gait and Vocation/Vocational requirements: retired Runner, broadcasting/film/video, Tree surgeon; enjoys spending time with grandchildren in [place] and goes to [employer] weekly  PATIENT GOALS: To get stronger, to get back on a cane.  To use walker to get to the car (long ramp), get RLE into  car better  OBJECTIVE:    TODAY'S TREATMENT:  03/20/2024 Activity Comments  Gait 4WW 50 ft x 2, indoors Gait 4WW, outdoor incline  Cues for technique to stay close to 4WW and use brakes for decline; to push through for foot clearance up the incline, with supervision  LAQ 3 x 10 reps, 3# BLE Seated march 3 x 10, 3# LLE Hamstring curl, green band 3 x10 Heel raises 2 x 10 reps   No weight added RLE  Reviewed (without band) core stability/posture exercises as part of HEP: shoulder horizontal adduction, paloff press position Cues for paloff press; no c/o pain              TODAY'S TREATMENT: 04/03/2024 Aquatic therapy at [facility] - pool temperature 92 degrees   Patient seen for aquatic therapy today.  Treatment took place in water  3.6-4.8 feet deep depending upon activity.  Patient entered and exited the pool via stairs using step to pattern w/ Bil rails at SBA level.   Exercises: -Water  walking warmup 4x18 ft forward > backward > 4x18 laterally unsupported SBA, pt requires prolonged seated recovery following lateral stepping (each lap) due to R hip fatigue and burning -Straddle sit on yellow noodle using BUE/BLE to paddle 6x18 ft in 4 ft depth; pt requires increased time to stretch L shoulder and briefly float for pain management of shoulders following task -Swing sit on yellow noodle - time spent finding upright balance > LAQ w/ brief hold at end range x3 minutes > Seated hip abduction/adduction x2 minutes - good righting responses > low flutter kicks (high lift irritates low back) x2 minutes > seated marches x1 minute (moderate ROM maintained) > lateral weight shifting x1 minute > added lateral reaching x1 minute -Using pink water  paddles for shoulder abduction/adduction x20, no pain > horizontal adduction x20; pt has shoulder fatigue requiring floating and seated rest for pain management   Patient requires buoyancy of the water  for support for reduced fall risk with gait training and  balance exercises with SBA support and verbal cuing and return demonstration. Exercises able to be performed safely in water  without the risk of fall compared to those same exercises performed on land; viscosity of water  needed for resistance for strengthening. Current of water  provides perturbations for challenging static and dynamic balance.   PATIENT EDUCATION: Education details:03/22/2024:  Aquatic rationale. Person educated: Patient Education method: Explanation Education comprehension: verbalized understanding and returned demonstration    Access Code: [email or web address] URL: [email or web address] Date: 02/02/2024 Prepared by: [facility] - Outpatient  Rehab - [facility]  Program Notes Seated leg slides out and in;  have a pillow case under your right foot-slide your foot out and in, 3 x 10 reps.  You can have a 3-5# ankle weight on your ankle  Exercises - Seated Hip Adduction Isometrics with Ball  - 1 x daily - 7 x weekly - 3 sets - 10 reps - Seated Hip Abduction with Resistance  - 1 x daily - 7 x weekly - 3 sets - 10 reps - Sit to Stand with Resistance Around Legs  - 1 x daily - 7 x weekly - 3 sets - 5 reps - Seated Shoulder Row with Anchored Resistance  - 1 x daily - 5 x weekly - 2 sets - 10 reps - Seated Anti-Rotation Press With Anchored Resistance  - 1 x daily - 7 x weekly - 3 sets - 10 reps    --------------------------------------------------- Note: Objective measures were completed at Evaluation unless otherwise noted.  DIAGNOSTIC FINDINGS: MRI 09/2023   IMPRESSION:  1. Postoperative changes of T11-S1 laminectomy and posterior spinal fusion with right posterior spinal instrumentation from L2-L5. On prior CT, there is evidence of fracture through the left posterior fusion mass at L4-5, correlating with edema is seen in the left pedicle of L4 on today's study. 2. Decreased edema within the L4 and L5 vertebral bodies and posterior elements, as well as within the  L4-5 and L3-4 discs, likely reactive and related to the above fracture. 3. Loculated simple fluid within the thecal sac with some marginalized cauda equina nerve roots, most consistent with arachnoiditis. 4. No spinal canal stenosis or neural foraminal narrowing.  COGNITION: Overall cognitive status: History of cognitive impairments - at baseline   SENSATION: Light touch: Impaired  and to light touch-see below Reports some numbness L foot  POSTURE: Leg length diffierence  LOWER EXTREMITY ROM:     Active  Right Eval Left Eval  Hip flexion    Hip extension    Hip abduction    Hip adduction    Hip internal rotation    Hip external rotation    Knee flexion    Knee extension    Ankle dorsiflexion    Ankle plantarflexion    Ankle inversion    Ankle eversion     (Blank rows = not tested)  LOWER EXTREMITY MMT:    MMT Right Eval Left Eval  Hip flexion 3- 3+  Hip extension    Hip abduction 4 4  Hip adduction 4 4  Hip internal rotation    Hip external rotation    Knee flexion 4 4  Knee extension 3+ 4  Ankle dorsiflexion 3+ 3+  Ankle plantarflexion    Ankle inversion    Ankle eversion    (Blank rows = not tested)   TRANSFERS: Sit to stand: SBA  Assistive device utilized: Environmental consultant - 2 wheeled     Stand to sit: SBA  Assistive device utilized: Environmental consultant - 2 wheeled      GAIT: Findings: Gait Characteristics: excess lateral weightshift through trunk due to significant leg length difference and step through pattern, Distance walked: 50 ft, Assistive device utilized:Walker - 2 wheeled, Level of assistance: CGA, and Comments: NA  FUNCTIONAL TESTS:  5 times sit to stand: 14.44 sec with BUE support Timed up and go (TUG): 24.53 sec 2 minute walk test: NT 10 meter walk test: 21.34 sec (1.54 ft/sec) TUG cognitive: (naming colors) 28.62 sec 12M walk back:  42.16 sec with RW Stroop gait:  13.25 sec in 10 ft (naming color words)-0.75 ft/sec   18.75 sec in 10 feet (naming printed  colors)-0.53 ft/sec                                                                          PATIENT EDUCATION: Education details: See above. Person educated: Patient Education method: Explanation Education comprehension: verbalized understanding  HOME EXERCISE PROGRAM: Not yet initiated Did perform eccentric hip flexion/extension in sitting  GOALS: Goals reviewed with patient? Yes  SHORT TERM GOALS: Target date: 02/24/2024  Pt will be independent with HEP for improved strength, balance, gait. Baseline: printed out and reviewed 02/23/24 Goal status: MET 02/23/24  2.  Pt will improve TUG score to less than or equal to  18 sec for decreased fall risk. Baseline: 24.53 sec; 14.54 sec with 4WW 02/23/24 Goal status: MET 02/23/24  3.  Pt will improve 3 M Walk backwards to less than or equal to 30 seconds for decreased fall risk. Baseline: 42.16 sec; 9.96 sec 02/23/24 Goal status: MET 02/23/24  4.  Pt will report at least 50% improvement in car transfers.  Baseline:  difficulty with RLE; Patient reports that she has been able to swing her LEs into the car by herself- reports 50% improvement. 02/23/24  Goal status:  MET 02/23/24  LONG TERM GOALS: Target date: 04/19/2024  Pt will be independent with HEP for improved strength, balance, gait. Baseline:  Goal status: IN PROGRESS  2.  Pt will improve gait velocity to at least 1.8 ft/sec for improved gait efficiency and safety. Baseline: 1.54 ft/sec Goal status: IN PROGRESS  3.  Pt will improve gait velocity with dual task to at least 1 ft/sec for improved gait and decreased fall risk. Baseline: 0.53-0.75 ft/sec with Stroop over 10 ft Goal status: IN PROGRESS  4.  Pt will improve 5x sit<>stand to less than or equal to 11.5 sec with light UE support to demonstrate improved functional strength and transfer efficiency. Baseline: 14.44 sec Goal status: IN PROGRESS  5.  Pt will verbalize plans for continued community fitness upon d/c from PT to  maximize gains made in PT. Baseline:  Goal status: IN PROGRESS  6.  Pt will ambulate at least 200 ft in 2 minute walk, for improved gait efficiency and endurance.  Baseline: 100 ft in 1:30  Goal status:  IN PROGRESS    ASSESSMENT:  CLINICAL IMPRESSION: Pt presents for aquatic session   OBJECTIVE IMPAIRMENTS: Abnormal gait, decreased balance, decreased mobility, difficulty walking, decreased strength, impaired sensation, postural dysfunction, and pain.   ACTIVITY LIMITATIONS: sitting, standing, transfers, locomotion level, and caring for others  PARTICIPATION LIMITATIONS: driving, shopping, community activity, and travel  PERSONAL FACTORS: 3+ comorbidities: see PMH above are also affecting patient's functional outcome.   REHAB POTENTIAL: Good  CLINICAL DECISION MAKING: Evolving/moderate complexity  EVALUATION COMPLEXITY: Moderate  PLAN:  PT FREQUENCY: 2x/week  PT DURATION: 8 weeks plus eval visit  PLANNED INTERVENTIONS: [id number]- PT Re-evaluation, 97750- Physical Performance Testing, 97110-Therapeutic exercises, 97530- Therapeutic activity, [id number]- Neuromuscular re-education, 97535- Self Care, [id number]- Manual therapy, [id number]- Gait training, [phone]- Canalith repositioning, [id number]- Aquatic Therapy, Patient/Family education, and Balance training  PLAN FOR NEXT SESSION: Consolidate/update HEP and work to progress weights/band as pt tolerates.  How did trying ramp at home go?  Pt may bring in her 4WW next visit; Ask about back pain and progress standing and gait tolerance; RLE strength for car transfers; pt requests not to perform Nustep and physioball activities d/t hip pain; pt reports that she would like to work towards being able to walk down her ramp and into her car-work on outdoor sidewalk distance to simulate ramp  AQUATICS: Frequency: 1x/week Duration: 4 weeks  Special Instruction: chronic hip and back pain, imbalance   Aquatics:  continue core work - try ankle weights/aquatic  cuffs in sitting  Damien Fought, PT, MPT [facility] [address] [place], [place], [address] Phone: [phone]   Fax:  [phone] 04/03/24, 8:30 AM

## 2024-04-05 ENCOUNTER — Ambulatory Visit: Admitting: Emergency Medicine

## 2024-04-05 ENCOUNTER — Ambulatory Visit: Admitting: Physical Therapy

## 2024-04-05 ENCOUNTER — Encounter: Payer: Self-pay | Admitting: Emergency Medicine

## 2024-04-05 VITALS — BP 124/68 | HR 65 | Ht 60.0 in | Wt 152.0 lb

## 2024-04-05 DIAGNOSIS — Z23 Encounter for immunization: Secondary | ICD-10-CM

## 2024-04-05 DIAGNOSIS — R911 Solitary pulmonary nodule: Secondary | ICD-10-CM | POA: Diagnosis not present

## 2024-04-05 DIAGNOSIS — J449 Chronic obstructive pulmonary disease, unspecified: Secondary | ICD-10-CM

## 2024-04-05 DIAGNOSIS — F17210 Nicotine dependence, cigarettes, uncomplicated: Secondary | ICD-10-CM

## 2024-04-05 DIAGNOSIS — Z720 Tobacco use: Secondary | ICD-10-CM

## 2024-04-05 NOTE — Patient Instructions (Signed)
 We reviewed your CT scan of the chest today.  Right lower lobe pulmonary nodule stable.  Good news. Plan for repeat CT chest in January 2026. Need to continue to work on decreasing your cigarettes.  Ultimate goal will be to stop altogether.  We talked about strategies and techniques to stop today. We will hold off on any inhaled medication for now.  Please let us  know if you develop any new respiratory symptoms Follow Dr. Shelah in January after your CT so we can review together.

## 2024-04-05 NOTE — Assessment & Plan Note (Signed)
 Reviewed symptoms.  Minimally symptomatic at this time.  She does not have bronchodilators ordered.  We will hold off for now but consider starting if symptoms evolve.  Plan for flu shot today.  Most important thing will be for her to stop smoking

## 2024-04-05 NOTE — Assessment & Plan Note (Signed)
 Discussed in detail today.  Talked about strategies to cut down and then stop.  Both behavioral strategies and nicotine  replacement.  She is motivated to stop.

## 2024-04-05 NOTE — Progress Notes (Signed)
 Subjective:    Patient ID: [name], female    DOB: Jul 23, 1955, 68 y.o.   MRN: [id number]  HPI  ROV 08/18/2023 --68 year old woman with a history of tobacco use and possible COPD.  I met her earlier this month after a complicated hospitalization for osteomyelitis in the lumbar spine following hardware and wound dehiscence, then subsequently ventriculitis with a prolonged ICU stay.  She had a tracheostomy, since decannulated.  She participates in lung cancer screening program and had a CT 05/10/2023 that showed an 8 mm right lower lobe nodule in the region where she had significant airspace disease during her critical illness.  We performed PET to further evaluate. Today she reports  PET scan done 08/10/2023 and reviewed by me, shows her right lower lobe nodule is a bit less prominent, possibly smaller, certainly not larger.  There is no apparent hypermetabolism associated with the nodule.  ROV 04/05/2024 --follow-up visit 68 year old woman with a history of tobacco use, bipolar, hypertension, diabetes, GERD, scoliosis with history of complicated back surgery that resulted in prolonged critical care illness that included respiratory failure and tracheostomy (decannulated).  I seen her for COPD, tobacco use and pulmonary nodule in the right lower lobe. She had a bowel fistula surgery since I last saw her. She stopped smoking in prep for this, and is now back to 5-6 cig a day. Her back pain is ok - doing PT and exercise. She is motivated to quit cigarettes.   CT scan of the chest 02/08/2024 reviewed by me shows 8 mm right lower lobe pulmonary nodule stable compared with priors back to 05/10/2023.  Interval resolution of parabronchial vascular ground glass in the bilateral lower lobes, question PAH based on enlarged pulmonary artery trunk.    Review of Systems As per HPI  Past Medical History:  Diagnosis Date   Anemia    Anxiety    Arthritis    Bipolar disorder (HCC)    Cardiomyopathy  (HCC)    Complication of anesthesia    Awake but could not move   Depression    Diskitis 10/04/2023   GERD (gastroesophageal reflux disease)    Heart failure (HCC)    Acute   Heart murmur    related to VSD   High cholesterol    History of blood transfusion    related to OR (08/19/2016)   History of hiatal hernia    Hypertension    Hyperthyroidism    Hypotension 09/06/2023   Mild cognitive impairment 09/06/2018   Paroxysmal ventricular tachycardia (HCC)    Pneumonia    Rectovaginal fistula 09/06/2023   Tachycardia, unspecified    Type II diabetes mellitus (HCC)    UTI (urinary tract infection)    being treated with Keflex    Ventricular septal defect    Ventriculitis of brain due to bacteria 11/02/2022   Vertebral fracture, osteoporotic (HCC) 01/06/2023     Family History  Problem Relation Age of Onset   Other Mother        alive   Stroke Father 93       deceased   Dementia Father    Chorea Maternal Grandfather    Dementia Maternal Aunt    Dementia Maternal Aunt    Heart attack Other        multiple uncles have died with myocardial infarction   Bladder Cancer Neg Hx    Uterine cancer Neg Hx      Social History   Socioeconomic History   Marital status: Married  Spouse name: Not on file   Number of children: Not on file   Years of education: Not on file   Highest education level: Master's degree (e.g., MA, MS, MEng, MEd, MSW, MBA)  Occupational History   Occupation: Magazine features editor: SELF-EMPLOYED    Comment: former  Tobacco Use   Smoking status: Former    Types: Cigarettes    Passive exposure: Never   Smokeless tobacco: Never   Tobacco comments:    03/30/21 smokes 2 cigs daily.      08/06/22 GLENWOOD Raisin stated that the patient is not smoking while at [facility] for rehab. KM    11-23-23 has been quit for 3 weeks  Vaping Use   Vaping status: Never Used  Substance and Sexual Activity   Alcohol use: No   Drug use: No   Sexual activity: Not  Currently    Partners: Male    Birth control/protection: Surgical    Comment: Hysterectomy  Other Topics Concern   Not on file  Social History Narrative   Right handed   Caffeine  2-3 cups daily    Lives at home with husband    Social Drivers of Health   Financial Resource Strain: Low Risk  (07/08/2023)   Received from [facility]   Overall Financial Resource Strain (CARDIA)    Difficulty of Paying Living Expenses: Not hard at all  Food Insecurity: No Food Insecurity (11/30/2023)   Hunger Vital Sign    Worried About Running Out of Food in the Last Year: Never true    Ran Out of Food in the Last Year: Never true  Transportation Needs: No Transportation Needs (11/30/2023)   PRAPARE - Administrator, Civil Service (Medical): No    Lack of Transportation (Non-Medical): No  Physical Activity: Not on file  Stress: Not on file  Social Connections: Socially Integrated (11/30/2023)   Social Connection and Isolation Panel    Frequency of Communication with Friends and Family: Twice a week    Frequency of Social Gatherings with Friends and Family: Twice a week    Attends Religious Services: 1 to 4 times per year    Active Member of [employer] or Organizations: No    Attends Engineer, structural: 1 to 4 times per year    Marital Status: Married  Catering manager Violence: Not At Risk (11/30/2023)   Humiliation, Afraid, Rape, and Kick questionnaire    Fear of Current or Ex-Partner: No    Emotionally Abused: No    Physically Abused: No    Sexually Abused: No     Allergies  Allergen Reactions   Gallium (Radioactive Isotope) Swelling    Pt states she can not have radioactive injections.    Paxlovid [Nirmatrelvir-Ritonavir]      Outpatient Medications Prior to Visit  Medication Sig Dispense Refill   acetaminophen  (TYLENOL ) 325 MG tablet Take 1-2 tablets (325-650 mg total) by mouth every 6 (six) hours as needed for mild pain or headache.     ascorbic  acid (VITAMIN C ) 500 MG tablet Take 500 mg by mouth daily.     ASPIRIN  81 PO Take 81 mg by mouth daily.     buPROPion  (WELLBUTRIN  XL) 150 MG 24 hr tablet Take 1 tablet (150 mg total) by mouth daily. 30 tablet 0   busPIRone  (BUSPAR ) 7.5 MG tablet Take 7.5 mg by mouth 2 (two) times daily.     Calcium  Carb-Cholecalciferol  (CALCIUM  600+D3) 600-20 MG-MCG TABS Take 1  tablet by mouth in the morning and at bedtime.     carvedilol  (COREG ) 25 MG tablet Take 1 tablet (25 mg total) by mouth 2 (two) times daily with a meal. 60 tablet 0   Cholecalciferol  (VITAMIN D3) 50 MCG (2000 UT) capsule Take 2,000 Units by mouth daily.     cyclobenzaprine  (FLEXERIL ) 10 MG tablet Take 10 mg by mouth 3 (three) times daily.     denosumab (PROLIA) 60 MG/ML SOSY injection Inject 60 mg into the skin every 6 (six) months.     diphenoxylate -atropine  (LOMOTIL ) 2.5-0.025 MG tablet Take 1 tablet by mouth 2 (two) times daily as needed for diarrhea or loose stools. 30 tablet 0   donepezil  (ARICEPT ) 10 MG tablet Take 10 mg by mouth every morning.     doxycycline  (VIBRA -TABS) 100 MG tablet Take 1 tablet (100 mg total) by mouth 2 (two) times daily. 60 tablet 11   empagliflozin  (JARDIANCE ) 10 MG TABS tablet Take 1 tablet (10 mg total) by mouth daily before breakfast. 90 tablet 3   estradiol  (ESTRACE ) 0.1 MG/GM vaginal cream Place 0.5g nightly for two weeks then twice a week after 30 g 3   famotidine  (PEPCID ) 20 MG tablet Take 1 tablet (20 mg total) by mouth 2 (two) times daily. (Patient taking differently: Take 20 mg by mouth every evening.) 60 tablet 0   fenoprofen (NALFON ) 600 MG TABS tablet Take 600 mg by mouth in the morning and at bedtime.     fluvoxaMINE  (LUVOX ) 100 MG tablet Take 1 tablet (100 mg total) by mouth at bedtime. 30 tablet 0   gabapentin  (NEURONTIN ) 100 MG capsule Take 1 capsule (100 mg total) by mouth 3 (three) times daily. 90 capsule 0   Lactobacillus (ACIDOPHILUS) 100 MG CAPS Take 1 capsule (100 mg total) by mouth 2  (two) times daily. (Patient taking differently: Take 250 mg by mouth daily.) 60 capsule 0   lamoTRIgine  (LAMICTAL ) 100 MG tablet Take 200 mg by mouth at bedtime.     levothyroxine  (SYNTHROID ) 88 MCG tablet Take 1 tablet (88 mcg total) by mouth daily before breakfast. 30 tablet 0   losartan  (COZAAR ) 25 MG tablet Take 0.5 tablets (12.5 mg total) by mouth daily.     Melatonin 10 MG TABS Take 10 mg by mouth at bedtime.     metFORMIN  (GLUCOPHAGE ) 500 MG tablet Take 1 tablet (500 mg total) by mouth 2 (two) times daily with a meal. 60 tablet 0   Multiple Vitamin (MULTIVITAMIN) tablet Take 1 tablet by mouth daily.       Multiple Vitamins-Minerals (ZINC PO) Take 50 mg by mouth daily.     nicotine  (NICODERM CQ  - DOSED IN MG/24 HOURS) 21 mg/24hr patch Place 21 mg onto the skin daily.     ondansetron  (ZOFRAN ) 4 MG tablet Take 1 tablet (4 mg total) by mouth every 8 (eight) hours as needed for refractory nausea / vomiting. 20 tablet 0   pantoprazole  (PROTONIX ) 40 MG tablet Take 40 mg by mouth every morning.     potassium chloride  (KLOR-CON ) 10 MEQ tablet Take 20 mEq by mouth 3 (three) times daily.     rosuvastatin  (CRESTOR ) 20 MG tablet Take 20 mg by mouth daily.     sodium chloride  1 g tablet Take 1 tablet (1 g total) by mouth 2 (two) times daily with a meal. 60 tablet 0   Suzetrigine  50 MG TABS Take 50 mg by mouth every 12 (twelve) hours as needed. Take two 50mg  capsules for  your first dose at the start of a pain episode on an empty stomach, followed by 50mg  (1 capsule)  with or without food every 12 hours until pain episode is improved 29 tablet 1   traZODone  (DESYREL ) 100 MG tablet Take 1 tablet (100 mg total) by mouth at bedtime. 30 tablet 0   VRAYLAR  1.5 MG capsule Take 1.5 mg by mouth at bedtime.     nicotine  polacrilex (NICORETTE ) 4 MG gum Take 4 mg by mouth as needed for smoking cessation. (Patient not taking: Reported on 03/07/2024)     No facility-administered medications prior to visit.         Objective:   Physical Exam Vitals:   04/05/24 0827  BP: 124/68  Pulse: 65  SpO2: 99%  Weight: 152 lb (68.9 kg)  Height: 5' (1.524 m)     Gen: Pleasant, well-nourished, in no distress,  normal affect  ENT: No lesions,  mouth clear,  oropharynx clear, no postnasal drip  Neck: No JVD, no stridor  Lungs: No use of accessory muscles, no crackles or wheezing on normal respiration, no wheeze on forced expiration  Cardiovascular: RRR, heart sounds normal, no murmur or gallops, no peripheral edema  Musculoskeletal: No deformities, no cyanosis or clubbing  Neuro: alert, awake, non focal  Skin: Warm, no lesions or rash      Assessment & Plan:   Pulmonary nodule Stable in size and appearance, previously PET negative 07/2023.  Plan for repeat CT chest in January 2026 to follow for interval stability.  The nodule is solid and if we get 2 years of stability can probably switch back to lung cancer screening CTs.  COPD (chronic obstructive pulmonary disease) (HCC) Reviewed symptoms.  Minimally symptomatic at this time.  She does not have bronchodilators ordered.  We will hold off for now but consider starting if symptoms evolve.  Plan for flu shot today.  Most important thing will be for her to stop smoking  Tobacco use Discussed in detail today.  Talked about strategies to cut down and then stop.  Both behavioral strategies and nicotine  replacement.  She is motivated to stop.   Time spent smoking cessation counseling 5 minutes  Lamar Chris, MD, PhD 04/05/2024, 8:53 AM [facility] [phone] or if no answer before 7:00PM call [phone] For any issues after 7:00PM please call eLink [phone]

## 2024-04-05 NOTE — Assessment & Plan Note (Signed)
 Stable in size and appearance, previously PET negative 07/2023.  Plan for repeat CT chest in January 2026 to follow for interval stability.  The nodule is solid and if we get 2 years of stability can probably switch back to lung cancer screening CTs.

## 2024-04-07 ENCOUNTER — Other Ambulatory Visit (HOSPITAL_BASED_OUTPATIENT_CLINIC_OR_DEPARTMENT_OTHER): Payer: Self-pay

## 2024-04-09 ENCOUNTER — Encounter: Payer: Self-pay | Admitting: Physical Therapy

## 2024-04-09 ENCOUNTER — Ambulatory Visit: Payer: Self-pay | Admitting: Infectious Disease

## 2024-04-10 ENCOUNTER — Ambulatory Visit: Admitting: Rehabilitation

## 2024-04-10 ENCOUNTER — Ambulatory Visit: Admitting: Physical Therapy

## 2024-04-10 ENCOUNTER — Encounter: Payer: Self-pay | Admitting: Rehabilitation

## 2024-04-10 DIAGNOSIS — R2681 Unsteadiness on feet: Secondary | ICD-10-CM | POA: Diagnosis not present

## 2024-04-10 DIAGNOSIS — M6281 Muscle weakness (generalized): Secondary | ICD-10-CM

## 2024-04-10 DIAGNOSIS — R2689 Other abnormalities of gait and mobility: Secondary | ICD-10-CM

## 2024-04-10 NOTE — Therapy (Signed)
 OUTPATIENT PHYSICAL THERAPY NEURO TREATMENT NOTE   Patient Name: [name] MRN: [id number] DOB:10-15-1955, 68 y.o., female Today's Date: 04/10/2024   PCP: Rolinda Millman, MD  REFERRING PROVIDER: Urbano Albright, MD    END OF SESSION:  PT End of Session - 04/10/24 9176     Visit Number 15   did not see last visit, so still visit 15   Number of Visits 17    Date for Recertification  04/19/24    Authorization Type BCBS Medicare    Progress Note Due on Visit 10    PT Start Time 1100    PT Stop Time 1145    PT Time Calculation (min) 45 min    Equipment Utilized During Treatment Other (comment)   floatation devices as needed for safety   Activity Tolerance Patient tolerated treatment well;Patient limited by pain    Behavior During Therapy WFL for tasks assessed/performed               Past Medical History:  Diagnosis Date   Anemia    Anxiety    Arthritis    Bipolar disorder (HCC)    Cardiomyopathy (HCC)    Complication of anesthesia    Awake but could not move   Depression    Diskitis 10/04/2023   GERD (gastroesophageal reflux disease)    Heart failure (HCC)    Acute   Heart murmur    related to VSD   High cholesterol    History of blood transfusion    related to OR (08/19/2016)   History of hiatal hernia    Hypertension    Hyperthyroidism    Hypotension 09/06/2023   Mild cognitive impairment 09/06/2018   Paroxysmal ventricular tachycardia (HCC)    Pneumonia    Rectovaginal fistula 09/06/2023   Tachycardia, unspecified    Type II diabetes mellitus (HCC)    UTI (urinary tract infection)    being treated with Keflex    Ventricular septal defect    Ventriculitis of brain due to bacteria 11/02/2022   Vertebral fracture, osteoporotic (HCC) 01/06/2023   Past Surgical History:  Procedure Laterality Date   ABDOMINAL HYSTERECTOMY     BACK SURGERY     CARDIAC CATHETERIZATION N/A 06/23/2015   Procedure: Left Heart Cath and Coronary Angiography;   Surgeon: Ezra GORMAN Shuck, MD;  Location: [facility] INVASIVE CV LAB;  Service: Cardiovascular;  Laterality: N/A;   CARDIAC CATHETERIZATION  1960   VSD was so small; didn't need repaired   COLECTOMY, SIGMOID, ROBOT-ASSISTED N/A 11/30/2023   Procedure: COLECTOMY, SIGMOID, ROBOT-ASSISTED ROBOTIC LOW ANTERIOR RECTOSIGMOID RESECTION (LAR) WITH ANASTOMOSIS COLOVAGINAL FISTULA TAKEDOWN & REPAIR OMENTOPEXY OF VAGINA INTRAOPERATIVE ASSESSMENT OF TISSUE VASCULAR PERFUSION USING ICG (indocyanine green ) IMMUNOFLUORESCENCE TRANSVERSUS ABDOMINIS PLANE (TAP) BLOCK - BILATERAL;  Surgeon: Sheldon Standing, MD;  Location: [facility] ORS;  Service: General;  Late   CYSTOSCOPY WITH INDOCYANINE GREEN  IMAGING (ICG) N/A 11/30/2023   Procedure: Cystoscopy with bilateral ureteral FireFly injections Transurethral resection of bladder tumor--large;  Surgeon: Carolee Sherwood JONETTA DOUGLAS, MD;  Location: [facility];  Service: Urology;  Laterality: N/A;   EXAM UNDER ANESTHESIA WITH MANIPULATION OF HIP Right 06/02/2014   dr sheril   FLEXIBLE SIGMOIDOSCOPY N/A 11/30/2023   Procedure: KINGSTON SIDE;  Surgeon: Sheldon Standing, MD;  Location: [facility];  Service: General;  Laterality: N/A;   FRACTURE SURGERY     HERNIA REPAIR     HIP CLOSED REDUCTION Right 06/02/2014   Procedure: CLOSED MANIPULATION HIP;  Surgeon: Maude KANDICE sheril, MD;  Location:  [facility] OR;  Service: Orthopedics;  Laterality: Right;   IR FLUORO GUIDED NEEDLE PLC ASPIRATION/INJECTION LOC  08/09/2023   JOINT REPLACEMENT     JOINT REPLACEMENT     POSTERIOR LUMBAR FUSION 4 LEVEL N/A 06/26/2022   Procedure: Lumbar One To Lumbar Five Posterior Instrumented Fusion;  Surgeon: Cheryle Debby LABOR, MD;  Location: [facility] OR;  Service: Neurosurgery;  Laterality: N/A;   REFRACTIVE SURGERY Bilateral    SHOULDER ARTHROSCOPY Right    SHOULDER OPEN ROTATOR CUFF REPAIR Right    SPINAL FUSION  1996   t10 down to my coccyx   SPINE HARDWARE REMOVAL     TOTAL ABDOMINAL HYSTERECTOMY     TOTAL HIP ARTHROPLASTY  Right 05/10/2014   [place]      by dr lonni olcott   TOTAL KNEE ARTHROPLASTY Left    TOTAL SHOULDER ARTHROPLASTY Left 08/19/2016   Procedure: TOTAL SHOULDER ARTHROPLASTY;  Surgeon: Eva Herring, MD;  Location: [facility] OR;  Service: Orthopedics;  Laterality: Left;  Left total shoulder replacement   Patient Active Problem List   Diagnosis Date Noted   COPD (chronic obstructive pulmonary disease) (HCC) 04/05/2024   Colovaginal fistula 11/30/2023   Diskitis 10/04/2023   Hypotension 09/06/2023   Fracture of tenth thoracic vertebra (HCC) 09/05/2023   History of discitis 09/05/2023   Senile osteoporosis 09/05/2023   History of urinary retention 09/05/2023   Vaginal atrophy 09/05/2023   Nocturia 09/05/2023   Vulvar ulcer 09/05/2023   Pulmonary nodule 08/02/2023   Non-traumatic compression fracture of vertebral column (HCC) 06/24/2023   Nonobstructive atherosclerosis of coronary artery 02/10/2023   Bilateral pseudophakia 02/03/2023   Vertebral fracture, osteoporotic (HCC) 01/06/2023   Ventriculitis of brain due to bacteria 11/02/2022   Anemia of chronic illness 10/12/2022   Bipolar disorder, in partial remission, most recent episode depressed (HCC) 10/07/2022   Adjustment disorder with mixed anxiety and depressed mood 09/30/2022   Chronic wound 09/25/2022   Hyperglycemia 09/25/2022   Chronic midline thoracic back pain 09/17/2022   Primary insomnia 09/17/2022   Chronic combined systolic and diastolic CHF (congestive heart failure) (HCC) 09/14/2022   Acute blood loss anemia 09/09/2022   Infection of lumbar spine (HCC) 09/03/2022   Malnutrition of moderate degree 08/20/2022   Hardware complicating wound infection 08/20/2022   Infection of deep incisional surgical site after procedure 08/20/2022   Aspiration pneumonia of both lungs (HCC) 08/19/2022   Acute respiratory failure with hypoxia (HCC) 08/19/2022   Sepsis (HCC) 08/19/2022   Seizure (HCC) 08/19/2022   Wound dehiscence  08/09/2022   Delayed surgical wound healing 07/08/2022   Fracture of lumbar spine without cord injury (HCC) 06/26/2022   Lumbar vertebral fracture (HCC) 06/25/2022   Mood disorder 09/29/2021   Generalized muscle weakness 04/30/2019   Mild cognitive impairment 09/06/2018   Hyponatremia 02/28/2018   S/P shoulder replacement, left 08/19/2016   Anxiety 10/17/2014   Acid reflux 10/17/2014   BP (high blood pressure) 10/17/2014   Arthritis, degenerative 10/17/2014   Adult hypothyroidism 10/17/2014   UTI (urinary tract infection) 06/03/2014   Fracture of bone adjacent to prosthesis 06/03/2014   Peri-prosthetic fracture of femur following total hip arthroplasty 06/02/2014   Urinary retention 05/13/2014   Chronic pain 05/10/2014   History of hip surgery 05/10/2014   Osteoarthritis 09/30/2011   UNSPECIFIED HEART FAILURE 06/24/2010   UNSPECIFIED CONGENITAL DEFECT OF SEPTAL CLOSURE 06/24/2010   Secondary cardiomyopathy (HCC) 03/18/2010   Tobacco use 03/18/2010   DM 06/13/2009   HYPERTENSION, UNSPECIFIED 06/13/2009   VENTRICULAR TACHYCARDIA 06/13/2009  VENTRICULAR SEPTAL DEFECT, CONGENITAL 06/13/2009   Essential (primary) hypertension 06/13/2009   Diabetes mellitus type 2, noninsulin dependent (HCC) 06/13/2009    ONSET DATE: 01/05/2024 MD referral  REFERRING DIAG: R26.9 (ICD-10-CM) - Gait difficulty   THERAPY DIAG:  Unsteadiness on feet  Other abnormalities of gait and mobility  Muscle weakness (generalized)  Rationale for Evaluation and Treatment: Rehabilitation  SUBJECTIVE:                                                                                                                                                                                             SUBJECTIVE STATEMENT: Pt reports feeling 7/10 generalized pain today.  Aid brought pt in w/c and assisted in dressing room.  Pt reports otherwise doing well.  Loves to get in the pool.   Pt accompanied by: self  PERTINENT  HISTORY: Chronic osteomyelitis with worsening back pain; Hx of multiple R hip fractures with surgery to revisions, significant leg length difference with LLE longer than RLE, hx of infection post surgery in spine (treated on antibiotics) Per MD Note 01/09/2024:    Patient was seen by Dr. Elspeth Schultze who scheduled her for surgery and on  12/01/2023 she underwent   -ROBOTIC LOW ANTERIOR RECTOSIGMOID RESECTION (LAR) WITH ANASTOMOSIS -COLOVAGINAL FISTULA TAKEDOWN & REPAIR -OMENTOPEXY OF VAGINA -INTRAOPERATIVE ASSESSMENT OF TISSUE VASCULAR PERFUSION USING ICG (indocyanine green ) IMMUNOFLUORESCENCE -TRANSVERSUS ABDOMINIS PLANE (TAP) BLOCK - BILATERAL -FLEXIBLE SIGMOIDOSCOPY    Urology did joint surgery and did    1.  Cystoscopy with bilateral ureteral FireFly injections 2.  Transurethral resection of  possible bladder tumor--large   Pathology came back with:   A. BLADDER TUMOR, TRANSURETHERAL RESECTON:  Florid cystitis cystica and papillary cystitis  Negative for carcinoma   B. RECTOSIGMOID COLON RESECTION:  Diverticula with chronic inflammation, perforation, fistula and reactive  fibrosis  Negative for carcinoma  Five benign lymph nodes (0/5)   C. FINAL DISTAL MARGIN:  Benign colonic tissue         PAIN:  Are you having pain? Yes: NPRS scale: 5/10 Pain location: R hip/back Pain description: throbbing, grinding, sharp Aggravating factors: more activities, [place] Relieving factors: rest  PRECAUTIONS: Fall and Other: Recent colectomy-pt reports no restrictions Pt has history of osteoporosis and compression fractures RED FLAGS: None   WEIGHT BEARING RESTRICTIONS: No  FALLS: Has patient fallen in last 6 months? No  LIVING ENVIRONMENT: Lives with: lives with their family Lives in: House/apartment Stairs: long ramp in/out of home Has following equipment at home: Vannie - 2 wheeled and Wheelchair (manual)  PLOF: Needs assistance with gait and  Vocation/Vocational  requirements: retired Runner, broadcasting/film/video, Tree surgeon; enjoys spending time with grandchildren in [place] and goes to [employer] weekly  PATIENT GOALS: To get stronger, to get back on a cane.  To use walker to get to the car (long ramp), get RLE into car better  OBJECTIVE:    TODAY'S TREATMENT: 03/27/2024 Activity Comments  Gait 4WW: 50 ft supervision  Gait with SPC, 54ft x 2 Gait with SPC, 60 ft Supervision-tried varied heights, using cane in R hand; cane is 31.5 in height  SAQ, 3 x 10 reps 2#, then 4# RLE  Quad sets, RLE 2 x 10 reps    Hamstring sets BLE, 2 x 10 reps, supine   Seated hamstring curls, green band 3 x 10 reps   Seated hip abduction green band, 10 reps   Sit to stand green band at thighs 5 reps    Access Code: [email or web address] URL: [email or web address] Date: 03/27/2024 Prepared by: [facility] - Outpatient  Rehab - [facility]  Program Notes Seated leg slides out and in;  have a pillow case under your right foot-slide your foot out and in, 3 x 10 reps.  You can have a 3-5# ankle weight on your ankle  Exercises - Seated Hip Adduction Isometrics with Ball  - 1 x daily - 7 x weekly - 3 sets - 10 reps - Seated Hip Abduction with Resistance  - 1 x daily - 7 x weekly - 3 sets - 10 reps - Sit to Stand with Resistance Around Legs  - 1 x daily - 7 x weekly - 3 sets - 5 reps - Seated Shoulder Row with Anchored Resistance  - 1 x daily - 5 x weekly - 2 sets - 10 reps - Seated Anti-Rotation Press With Anchored Resistance  - 1 x daily - 7 x weekly - 3 sets - 10 reps - Seated Hamstring Curl with Anchored Resistance  - 1 x daily - 4 x weekly - 3 sets - 10 reps - Supine Quad Set  - 1 x daily - 5 x weekly - 3 sets - 10 reps - 3 sec hold - Short Arc Quad with Ankle Weight  - 1 x daily - 4 x weekly - 3 sets - 10 reps  PATIENT EDUCATION: Education details: Updates to HEP (consolidated/added from previous bout of therapy for current full HEP); discussed breaking the  exercises up into supine/seated/postural exercises alternating days for improved consistency/compliance of HEP Person educated: Patient Education method: Explanation, Demonstration, and Handouts Education comprehension: verbalized understanding and returned demonstration       TODAY'S TREATMENT: 04/10/2024 Aquatic therapy at [facility] - pool temperature 92 degrees   Patient seen for aquatic therapy today.  Treatment took place in water  3.6-4.8 feet deep depending upon activity.  Patient entered and exited the pool via stairs using step to pattern w/ Bil rails at SBA level.   Exercises: -Water  walking warmup 4x18 ft forward > backward > 4x18 laterally unsupported SBA, added moving multi colored barbells in abd/add with side stepping today.  Tolerated well -Forward marching holding yellow barbells x 4 laps with cues for posture and slower movement.   -Straddle sit on yellow noodle using BLE to paddle 6x18 ft in 4 ft depth;  Provided light barbells for support during bicycle to rest shoulders.   -Using smaller barbells for shoulder flex/ext moving quickly in alt pattern for core strengthening/arm strengthening (feet wide>feet narrow).  -Attempted plank on yellow noodle and was able to move noodle in/out of water  for  core strengthening x 4-5 reps however was too painful for L shoulder.  Tried with smaller noodle and was still painful so discontinued.   -Hip flex/abd/ext (with knee ext) x 10 reps on each side with wall for support.   -holding small barbells performing shoulder extension with slow return to surface of water  x 10 reps.    Patient requires buoyancy of the water  for support for reduced fall risk with gait training and balance exercises with SBA support and verbal cuing and return demonstration. Exercises able to be performed safely in water  without the risk of fall compared to those same exercises performed on land; viscosity of water  needed for resistance for strengthening. Current of  water  provides perturbations for challenging static and dynamic balance.    --------------------------------------------------- Note: Objective measures were completed at Evaluation unless otherwise noted.  DIAGNOSTIC FINDINGS: MRI 09/2023   IMPRESSION: 1. Postoperative changes of T11-S1 laminectomy and posterior spinal fusion with right posterior spinal instrumentation from L2-L5. On prior CT, there is evidence of fracture through the left posterior fusion mass at L4-5, correlating with edema is seen in the left pedicle of L4 on today's study. 2. Decreased edema within the L4 and L5 vertebral bodies and posterior elements, as well as within the L4-5 and L3-4 discs, likely reactive and related to the above fracture. 3. Loculated simple fluid within the thecal sac with some marginalized cauda equina nerve roots, most consistent with arachnoiditis. 4. No spinal canal stenosis or neural foraminal narrowing.  COGNITION: Overall cognitive status: History of cognitive impairments - at baseline   SENSATION: Light touch: Impaired  and to light touch-see below Reports some numbness L foot  POSTURE: Leg length diffierence  LOWER EXTREMITY ROM:     Active  Right Eval Left Eval  Hip flexion    Hip extension    Hip abduction    Hip adduction    Hip internal rotation    Hip external rotation    Knee flexion    Knee extension    Ankle dorsiflexion    Ankle plantarflexion    Ankle inversion    Ankle eversion     (Blank rows = not tested)  LOWER EXTREMITY MMT:    MMT Right Eval Left Eval  Hip flexion 3- 3+  Hip extension    Hip abduction 4 4  Hip adduction 4 4  Hip internal rotation    Hip external rotation    Knee flexion 4 4  Knee extension 3+ 4  Ankle dorsiflexion 3+ 3+  Ankle plantarflexion    Ankle inversion    Ankle eversion    (Blank rows = not tested)   TRANSFERS: Sit to stand: SBA  Assistive device utilized: Environmental consultant - 2 wheeled     Stand to sit: SBA   Assistive device utilized: Environmental consultant - 2 wheeled      GAIT: Findings: Gait Characteristics: excess lateral weightshift through trunk due to significant leg length difference and step through pattern, Distance walked: 50 ft, Assistive device utilized:Walker - 2 wheeled, Level of assistance: CGA, and Comments: NA  FUNCTIONAL TESTS:  5 times sit to stand: 14.44 sec with BUE support Timed up and go (TUG): 24.53 sec 2 minute walk test: NT 10 meter walk test: 21.34 sec (1.54 ft/sec) TUG cognitive: (naming colors) 28.62 sec 33M walk back:  42.16 sec with RW Stroop gait:  13.25 sec in 10 ft (naming color words)-0.75 ft/sec   18.75 sec in 10 feet (naming printed colors)-0.53 ft/sec  PATIENT EDUCATION: Education details: See above. Person educated: Patient Education method: Explanation Education comprehension: verbalized understanding  HOME EXERCISE PROGRAM: Not yet initiated Did perform eccentric hip flexion/extension in sitting  GOALS: Goals reviewed with patient? Yes  SHORT TERM GOALS: Target date: 02/24/2024  Pt will be independent with HEP for improved strength, balance, gait. Baseline: printed out and reviewed 02/23/24 Goal status: MET 02/23/24  2.  Pt will improve TUG score to less than or equal to 18 sec for decreased fall risk. Baseline: 24.53 sec; 14.54 sec with 4WW 02/23/24 Goal status: MET 02/23/24  3.  Pt will improve 3 M Walk backwards to less than or equal to 30 seconds for decreased fall risk. Baseline: 42.16 sec; 9.96 sec 02/23/24 Goal status: MET 02/23/24  4.  Pt will report at least 50% improvement in car transfers.  Baseline:  difficulty with RLE; Patient reports that she has been able to swing her LEs into the car by herself- reports 50% improvement. 02/23/24  Goal status:  MET 02/23/24  LONG TERM GOALS: Target date: 04/19/2024  Pt will be independent with HEP for improved strength, balance,  gait. Baseline:  Goal status: IN PROGRESS  2.  Pt will improve gait velocity to at least 1.8 ft/sec for improved gait efficiency and safety. Baseline: 1.54 ft/sec Goal status: IN PROGRESS  3.  Pt will improve gait velocity with dual task to at least 1 ft/sec for improved gait and decreased fall risk. Baseline: 0.53-0.75 ft/sec with Stroop over 10 ft Goal status: IN PROGRESS  4.  Pt will improve 5x sit<>stand to less than or equal to 11.5 sec with light UE support to demonstrate improved functional strength and transfer efficiency. Baseline: 14.44 sec Goal status: IN PROGRESS  5.  Pt will verbalize plans for continued community fitness upon d/c from PT to maximize gains made in PT. Baseline:  Goal status: IN PROGRESS  6.  Pt will ambulate at least 200 ft in 2 minute walk, for improved gait efficiency and endurance.  Baseline: 100 ft in 1:30  Goal status:  IN PROGRESS    ASSESSMENT:  CLINICAL IMPRESSION: Pt tolerated session well today.  Reports she will likely be fatigued and have some muscle soreness but pain has greatly decreased in session. Pt did inform PT about scoliosis and leg length descrepancy in session therefore we modified tasks as needed to improve comfort.   OBJECTIVE IMPAIRMENTS: Abnormal gait, decreased balance, decreased mobility, difficulty walking, decreased strength, impaired sensation, postural dysfunction, and pain.   ACTIVITY LIMITATIONS: sitting, standing, transfers, locomotion level, and caring for others  PARTICIPATION LIMITATIONS: driving, shopping, community activity, and travel  PERSONAL FACTORS: 3+ comorbidities: see PMH above are also affecting patient's functional outcome.   REHAB POTENTIAL: Good  CLINICAL DECISION MAKING: Evolving/moderate complexity  EVALUATION COMPLEXITY: Moderate  PLAN:  PT FREQUENCY: 2x/week  PT DURATION: 8 weeks plus eval visit  PLANNED INTERVENTIONS: [id number]- PT Re-evaluation, 97750- Physical Performance Testing,  97110-Therapeutic exercises, 97530- Therapeutic activity, [id number]- Neuromuscular re-education, 97535- Self Care, [id number]- Manual therapy, [id number]- Gait training, [phone]- Canalith repositioning, [id number]- Aquatic Therapy, Patient/Family education, and Balance training  PLAN FOR NEXT SESSION: *Pt has 3 visits in POC left, need to discuss POC next land visit.  How did trying ramp at home go? (03/27/24:  hasn't tried yet).  Ask about back pain and progress standing and gait tolerance; RLE strength for car transfers; pt requests not to perform Nustep and physioball activities d/t hip pain; pt reports that she would like to work  towards being able to walk down her ramp and into her car-work on outdoor sidewalk distance to simulate ramp  AQUATICS: Frequency: 1x/week Duration: 4 weeks  Special Instruction: chronic hip and back pain, imbalance   Aquatics:  continue core work - try ankle weights/aquatic cuffs in sitting  Damien Fought, PT, MPT [facility] [address] [place], [place], [address] Phone: [phone]   Fax:  [phone] 04/10/24, 12:51 PM

## 2024-04-11 ENCOUNTER — Telehealth: Payer: Self-pay | Admitting: *Deleted

## 2024-04-11 MED ORDER — SUZETRIGINE 50 MG PO TABS: 50.0000 mg | ORAL_TABLET | Freq: Two times a day (BID) | ORAL | 1 refills | Status: DC | PRN

## 2024-04-11 NOTE — Telephone Encounter (Signed)
 Mrs Tomaro called for a refill on her journavx  and she is going out of town tomorrow morning and she needs the refill today.

## 2024-04-12 ENCOUNTER — Ambulatory Visit: Admitting: Physical Therapy

## 2024-04-12 ENCOUNTER — Encounter: Payer: Self-pay | Admitting: Physical Therapy

## 2024-04-12 DIAGNOSIS — R2689 Other abnormalities of gait and mobility: Secondary | ICD-10-CM

## 2024-04-12 DIAGNOSIS — R2681 Unsteadiness on feet: Secondary | ICD-10-CM | POA: Diagnosis not present

## 2024-04-12 DIAGNOSIS — M6281 Muscle weakness (generalized): Secondary | ICD-10-CM

## 2024-04-12 NOTE — Therapy (Signed)
 OUTPATIENT PHYSICAL THERAPY NEURO TREATMENT NOTE   Patient Name: [name] MRN: [id number] DOB:[birth year], 68 y.o., female Today's Date: 04/12/2024   PCP: Rolinda Millman, MD  REFERRING PROVIDER: Urbano Albright, MD    END OF SESSION:  PT End of Session - 04/12/24 1533     Visit Number 16    Number of Visits 17    Date for Recertification  04/19/24    Authorization Type BCBS Medicare    Progress Note Due on Visit 10    PT Start Time 1533    PT Stop Time 1615    PT Time Calculation (min) 42 min    Equipment Utilized During Treatment --    Activity Tolerance Patient tolerated treatment well    Behavior During Therapy WFL for tasks assessed/performed                Past Medical History:  Diagnosis Date   Anemia    Anxiety    Arthritis    Bipolar disorder (HCC)    Cardiomyopathy (HCC)    Complication of anesthesia    Awake but could not move   Depression    Diskitis 10/04/2023   GERD (gastroesophageal reflux disease)    Heart failure (HCC)    Acute   Heart murmur    related to VSD   High cholesterol    History of blood transfusion    related to OR (08/19/2016)   History of hiatal hernia    Hypertension    Hyperthyroidism    Hypotension 09/06/2023   Mild cognitive impairment 09/06/2018   Paroxysmal ventricular tachycardia (HCC)    Pneumonia    Rectovaginal fistula 09/06/2023   Tachycardia, unspecified    Type II diabetes mellitus (HCC)    UTI (urinary tract infection)    being treated with Keflex    Ventricular septal defect    Ventriculitis of brain due to bacteria 11/02/2022   Vertebral fracture, osteoporotic (HCC) 01/06/2023   Past Surgical History:  Procedure Laterality Date   ABDOMINAL HYSTERECTOMY     BACK SURGERY     CARDIAC CATHETERIZATION N/A 06/23/2015   Procedure: Left Heart Cath and Coronary Angiography;  Surgeon: Ezra GORMAN Shuck, MD;  Location: [facility] INVASIVE CV LAB;  Service: Cardiovascular;  Laterality: N/A;   CARDIAC  CATHETERIZATION  1960   VSD was so small; didn't need repaired   COLECTOMY, SIGMOID, ROBOT-ASSISTED N/A 11/30/2023   Procedure: COLECTOMY, SIGMOID, ROBOT-ASSISTED ROBOTIC LOW ANTERIOR RECTOSIGMOID RESECTION (LAR) WITH ANASTOMOSIS COLOVAGINAL FISTULA TAKEDOWN & REPAIR OMENTOPEXY OF VAGINA INTRAOPERATIVE ASSESSMENT OF TISSUE VASCULAR PERFUSION USING ICG (indocyanine green ) IMMUNOFLUORESCENCE TRANSVERSUS ABDOMINIS PLANE (TAP) BLOCK - BILATERAL;  Surgeon: Sheldon Standing, MD;  Location: [facility];  Service: General;  Late   CYSTOSCOPY WITH INDOCYANINE GREEN  IMAGING (ICG) N/A 11/30/2023   Procedure: Cystoscopy with bilateral ureteral FireFly injections Transurethral resection of bladder tumor--large;  Surgeon: Carolee Sherwood JONETTA DOUGLAS, MD;  Location: [facility];  Service: Urology;  Laterality: N/A;   EXAM UNDER ANESTHESIA WITH MANIPULATION OF HIP Right 06/02/2014   dr sheril   FLEXIBLE SIGMOIDOSCOPY N/A 11/30/2023   Procedure: KINGSTON SIDE;  Surgeon: Sheldon Standing, MD;  Location: [facility];  Service: General;  Laterality: N/A;   FRACTURE SURGERY     HERNIA REPAIR     HIP CLOSED REDUCTION Right 06/02/2014   Procedure: CLOSED MANIPULATION HIP;  Surgeon: Maude KANDICE sheril, MD;  Location: [facility];  Service: Orthopedics;  Laterality: Right;   IR FLUORO GUIDED NEEDLE PLC ASPIRATION/INJECTION LOC  08/09/2023  JOINT REPLACEMENT     JOINT REPLACEMENT     POSTERIOR LUMBAR FUSION 4 LEVEL N/A 06/26/2022   Procedure: Lumbar One To Lumbar Five Posterior Instrumented Fusion;  Surgeon: Cheryle Debby LABOR, MD;  Location: [facility] OR;  Service: Neurosurgery;  Laterality: N/A;   REFRACTIVE SURGERY Bilateral    SHOULDER ARTHROSCOPY Right    SHOULDER OPEN ROTATOR CUFF REPAIR Right    SPINAL FUSION  1996   t10 down to my coccyx   SPINE HARDWARE REMOVAL     TOTAL ABDOMINAL HYSTERECTOMY     TOTAL HIP ARTHROPLASTY Right 05/10/2014   [place]      by dr lonni olcott   TOTAL KNEE ARTHROPLASTY Left    TOTAL SHOULDER  ARTHROPLASTY Left 08/19/2016   Procedure: TOTAL SHOULDER ARTHROPLASTY;  Surgeon: Eva Herring, MD;  Location: [facility] OR;  Service: Orthopedics;  Laterality: Left;  Left total shoulder replacement   Patient Active Problem List   Diagnosis Date Noted   COPD (chronic obstructive pulmonary disease) (HCC) 04/05/2024   Colovaginal fistula 11/30/2023   Diskitis 10/04/2023   Hypotension 09/06/2023   Fracture of tenth thoracic vertebra (HCC) 09/05/2023   History of discitis 09/05/2023   Senile osteoporosis 09/05/2023   History of urinary retention 09/05/2023   Vaginal atrophy 09/05/2023   Nocturia 09/05/2023   Vulvar ulcer 09/05/2023   Pulmonary nodule 08/02/2023   Non-traumatic compression fracture of vertebral column (HCC) 06/24/2023   Nonobstructive atherosclerosis of coronary artery 02/10/2023   Bilateral pseudophakia 02/03/2023   Vertebral fracture, osteoporotic (HCC) 01/06/2023   Ventriculitis of brain due to bacteria 11/02/2022   Anemia of chronic illness 10/12/2022   Bipolar disorder, in partial remission, most recent episode depressed (HCC) 10/07/2022   Adjustment disorder with mixed anxiety and depressed mood 09/30/2022   Chronic wound 09/25/2022   Hyperglycemia 09/25/2022   Chronic midline thoracic back pain 09/17/2022   Primary insomnia 09/17/2022   Chronic combined systolic and diastolic CHF (congestive heart failure) (HCC) 09/14/2022   Acute blood loss anemia 09/09/2022   Infection of lumbar spine (HCC) 09/03/2022   Malnutrition of moderate degree 08/20/2022   Hardware complicating wound infection 08/20/2022   Infection of deep incisional surgical site after procedure 08/20/2022   Aspiration pneumonia of both lungs (HCC) 08/19/2022   Acute respiratory failure with hypoxia (HCC) 08/19/2022   Sepsis (HCC) 08/19/2022   Seizure (HCC) 08/19/2022   Wound dehiscence 08/09/2022   Delayed surgical wound healing 07/08/2022   Fracture of lumbar spine without cord injury (HCC)  06/26/2022   Lumbar vertebral fracture (HCC) 06/25/2022   Mood disorder 09/29/2021   Generalized muscle weakness 04/30/2019   Mild cognitive impairment 09/06/2018   Hyponatremia 02/28/2018   S/P shoulder replacement, left 08/19/2016   Anxiety 10/17/2014   Acid reflux 10/17/2014   BP (high blood pressure) 10/17/2014   Arthritis, degenerative 10/17/2014   Adult hypothyroidism 10/17/2014   UTI (urinary tract infection) 06/03/2014   Fracture of bone adjacent to prosthesis 06/03/2014   Peri-prosthetic fracture of femur following total hip arthroplasty 06/02/2014   Urinary retention 05/13/2014   Chronic pain 05/10/2014   History of hip surgery 05/10/2014   Osteoarthritis 09/30/2011   UNSPECIFIED HEART FAILURE 06/24/2010   UNSPECIFIED CONGENITAL DEFECT OF SEPTAL CLOSURE 06/24/2010   Secondary cardiomyopathy (HCC) 03/18/2010   Tobacco use 03/18/2010   DM 06/13/2009   HYPERTENSION, UNSPECIFIED 06/13/2009   VENTRICULAR TACHYCARDIA 06/13/2009   VENTRICULAR SEPTAL DEFECT, CONGENITAL 06/13/2009   Essential (primary) hypertension 06/13/2009   Diabetes mellitus type 2, noninsulin dependent (  HCC) 06/13/2009    ONSET DATE: 01/05/2024 MD referral  REFERRING DIAG: R26.9 (ICD-10-CM) - Gait difficulty   THERAPY DIAG:  Unsteadiness on feet  Other abnormalities of gait and mobility  Muscle weakness (generalized)  Rationale for Evaluation and Treatment: Rehabilitation  SUBJECTIVE:                                                                                                                                                                                             SUBJECTIVE STATEMENT: Very painful after the pool therapy.  Anytime we work on my core, it really hurts my back.  Want my caregiver to be here to help with the HEP.  Did walk the ramp with husband and my RW. Pt accompanied by: self, caregiver  PERTINENT HISTORY: Chronic osteomyelitis with worsening back pain; Hx of multiple R hip  fractures with surgery to revisions, significant leg length difference with LLE longer than RLE, hx of infection post surgery in spine (treated on antibiotics) Per MD Note 01/09/2024:    Patient was seen by Dr. Elspeth Schultze who scheduled her for surgery and on  12/01/2023 she underwent   -ROBOTIC LOW ANTERIOR RECTOSIGMOID RESECTION (LAR) WITH ANASTOMOSIS -COLOVAGINAL FISTULA TAKEDOWN & REPAIR -OMENTOPEXY OF VAGINA -INTRAOPERATIVE ASSESSMENT OF TISSUE VASCULAR PERFUSION USING ICG (indocyanine green ) IMMUNOFLUORESCENCE -TRANSVERSUS ABDOMINIS PLANE (TAP) BLOCK - BILATERAL -FLEXIBLE SIGMOIDOSCOPY    Urology did joint surgery and did    1.  Cystoscopy with bilateral ureteral FireFly injections 2.  Transurethral resection of  possible bladder tumor--large   Pathology came back with:   A. BLADDER TUMOR, TRANSURETHERAL RESECTON:  Florid cystitis cystica and papillary cystitis  Negative for carcinoma   B. RECTOSIGMOID COLON RESECTION:  Diverticula with chronic inflammation, perforation, fistula and reactive  fibrosis  Negative for carcinoma  Five benign lymph nodes (0/5)   C. FINAL DISTAL MARGIN:  Benign colonic tissue         PAIN:  Are you having pain? Yes: NPRS scale: 5/10 Pain location: R hip/back Pain description: throbbing, grinding, sharp Aggravating factors: more activities, [place] Relieving factors: rest  PRECAUTIONS: Fall and Other: Recent colectomy-pt reports no restrictions Pt has history of osteoporosis and compression fractures RED FLAGS: None   WEIGHT BEARING RESTRICTIONS: No  FALLS: Has patient fallen in last 6 months? No  LIVING ENVIRONMENT: Lives with: lives with their family Lives in: House/apartment Stairs: long ramp in/out of home Has following equipment at home: Vannie - 2 wheeled and Wheelchair (manual)  PLOF: Needs assistance with gait and Vocation/Vocational requirements: retired Runner, broadcasting/film/video, Tree surgeon; enjoys spending time with  grandchildren in [place]  and [place]  PATIENT GOALS: To get stronger, to get back on a cane.  To use walker to get to the car (long ramp), get RLE into car better  OBJECTIVE:   TODAY'S TREATMENT: 04/12/2024 Activity Comments  Review of HEP exercises -see below (italicized) 1 set of 10 reps with caregiver present to assist with anchoring theraband.  Instructions provided for reps, frequency, use of band and weights  Standing glut sets, 2 x 5 reps Added to HEP, good form, no pain        Gait in 1 min 80 ft with SPC Stops due to back pain/fatigue  Gait x 1:30 with 4WW, 100 ft Cues to lessen UE support and keep 4WW closer to her BOS; stops due to back pain/fatigue    Access Code: [email or web address] URL: [email or web address] Date: 03/27/2024, 03/2524 *standing glut sets; wrote in gait at home x 2 minutes, 2-3 times per day with walker Prepared by: [facility] - Outpatient  Rehab - [facility]  Program Notes Seated leg slides out and in;  have a pillow case under your right foot-slide your foot out and in, 3 x 10 reps.  You can have a 3-5# ankle weight on your ankle  Exercises - Seated Hip Adduction Isometrics with Ball  - 1 x daily - 7 x weekly - 3 sets - 10 reps - Seated Hip Abduction with Resistance  - 1 x daily - 7 x weekly - 3 sets - 10 reps - Sit to Stand with Resistance Around Legs  - 1 x daily - 7 x weekly - 3 sets - 5 reps - Seated Shoulder Row with Anchored Resistance  - 1 x daily - 5 x weekly - 2 sets - 10 reps - Seated Anti-Rotation Press With Anchored Resistance  - 1 x daily - 7 x weekly - 3 sets - 10 reps - Seated Hamstring Curl with Anchored Resistance  - 1 x daily - 4 x weekly - 3 sets - 10 reps - Supine Quad Set  - 1 x daily - 5 x weekly - 3 sets - 10 reps - 3 sec hold - Short Arc Quad with Ankle Weight  - 1 x daily - 4 x weekly - 3 sets - 10 reps  PATIENT EDUCATION: Education details: Caregiver education to [employer]; additions to HEP; answered  caregiver questions about HEP Person educated: Patient Education method: Explanation, Demonstration, and Handouts Education comprehension: verbalized understanding and returned demonstration       TODAY'S TREATMENT: 03/22/2024 Aquatic therapy at [facility] - pool temperature 92 degrees   Patient seen for aquatic therapy today.  Treatment took place in water  3.6-4.8 feet deep depending upon activity.  Patient entered and exited the pool via stairs using step to pattern w/ Bil rails at SBA level.   Exercises: -Water  walking warmup 4x18 ft forward > backward > 4x18 laterally unsupported SBA, pt requires prolonged seated recovery following lateral stepping (each lap) due to R hip fatigue and burning -Straddle sit on yellow noodle using BUE/BLE to paddle 6x18 ft in 4 ft depth; pt requires increased time to stretch L shoulder and briefly float for pain management of shoulders following task -Swing sit on yellow noodle - time spent finding upright balance > LAQ w/ brief hold at end range x3 minutes > Seated hip abduction/adduction x2 minutes - good righting responses > low flutter kicks (high lift irritates low back) x2 minutes > seated marches x1 minute (moderate ROM maintained) > lateral  weight shifting x1 minute > added lateral reaching x1 minute -Using pink water  paddles for shoulder abduction/adduction x20, no pain > horizontal adduction x20; pt has shoulder fatigue requiring floating and seated rest for pain management   Patient requires buoyancy of the water  for support for reduced fall risk with gait training and balance exercises with SBA support and verbal cuing and return demonstration. Exercises able to be performed safely in water  without the risk of fall compared to those same exercises performed on land; viscosity of water  needed for resistance for strengthening. Current of water  provides perturbations for challenging static and dynamic balance.     --------------------------------------------------- Note: Objective measures were completed at Evaluation unless otherwise noted.  DIAGNOSTIC FINDINGS: MRI 09/2023   IMPRESSION: 1. Postoperative changes of T11-S1 laminectomy and posterior spinal fusion with right posterior spinal instrumentation from L2-L5. On prior CT, there is evidence of fracture through the left posterior fusion mass at L4-5, correlating with edema is seen in the left pedicle of L4 on today's study. 2. Decreased edema within the L4 and L5 vertebral bodies and posterior elements, as well as within the L4-5 and L3-4 discs, likely reactive and related to the above fracture. 3. Loculated simple fluid within the thecal sac with some marginalized cauda equina nerve roots, most consistent with arachnoiditis. 4. No spinal canal stenosis or neural foraminal narrowing.  COGNITION: Overall cognitive status: History of cognitive impairments - at baseline   SENSATION: Light touch: Impaired  and to light touch-see below Reports some numbness L foot  POSTURE: Leg length diffierence  LOWER EXTREMITY ROM:     Active  Right Eval Left Eval  Hip flexion    Hip extension    Hip abduction    Hip adduction    Hip internal rotation    Hip external rotation    Knee flexion    Knee extension    Ankle dorsiflexion    Ankle plantarflexion    Ankle inversion    Ankle eversion     (Blank rows = not tested)  LOWER EXTREMITY MMT:    MMT Right Eval Left Eval  Hip flexion 3- 3+  Hip extension    Hip abduction 4 4  Hip adduction 4 4  Hip internal rotation    Hip external rotation    Knee flexion 4 4  Knee extension 3+ 4  Ankle dorsiflexion 3+ 3+  Ankle plantarflexion    Ankle inversion    Ankle eversion    (Blank rows = not tested)   TRANSFERS: Sit to stand: SBA  Assistive device utilized: Environmental consultant - 2 wheeled     Stand to sit: SBA  Assistive device utilized: Environmental consultant - 2 wheeled      GAIT: Findings: Gait  Characteristics: excess lateral weightshift through trunk due to significant leg length difference and step through pattern, Distance walked: 50 ft, Assistive device utilized:Walker - 2 wheeled, Level of assistance: CGA, and Comments: NA  FUNCTIONAL TESTS:  5 times sit to stand: 14.44 sec with BUE support Timed up and go (TUG): 24.53 sec 2 minute walk test: NT 10 meter walk test: 21.34 sec (1.54 ft/sec) TUG cognitive: (naming colors) 28.62 sec 74M walk back:  42.16 sec with RW Stroop gait:  13.25 sec in 10 ft (naming color words)-0.75 ft/sec   18.75 sec in 10 feet (naming printed colors)-0.53 ft/sec  PATIENT EDUCATION: Education details: See above. Person educated: Patient Education method: Explanation Education comprehension: verbalized understanding  HOME EXERCISE PROGRAM: Not yet initiated Did perform eccentric hip flexion/extension in sitting  GOALS: Goals reviewed with patient? Yes  SHORT TERM GOALS: Target date: 02/24/2024  Pt will be independent with HEP for improved strength, balance, gait. Baseline: printed out and reviewed 02/23/24 Goal status: MET 02/23/24  2.  Pt will improve TUG score to less than or equal to 18 sec for decreased fall risk. Baseline: 24.53 sec; 14.54 sec with 4WW 02/23/24 Goal status: MET 02/23/24  3.  Pt will improve 3 M Walk backwards to less than or equal to 30 seconds for decreased fall risk. Baseline: 42.16 sec; 9.96 sec 02/23/24 Goal status: MET 02/23/24  4.  Pt will report at least 50% improvement in car transfers.  Baseline:  difficulty with RLE; Patient reports that she has been able to swing her LEs into the car by herself- reports 50% improvement. 02/23/24  Goal status:  MET 02/23/24  LONG TERM GOALS: Target date: 04/19/2024  Pt will be independent with HEP for improved strength, balance, gait. Baseline:  Goal status: IN PROGRESS  2.  Pt will improve gait velocity to at  least 1.8 ft/sec for improved gait efficiency and safety. Baseline: 1.54 ft/sec Goal status: IN PROGRESS  3.  Pt will improve gait velocity with dual task to at least 1 ft/sec for improved gait and decreased fall risk. Baseline: 0.53-0.75 ft/sec with Stroop over 10 ft Goal status: IN PROGRESS  4.  Pt will improve 5x sit<>stand to less than or equal to 11.5 sec with light UE support to demonstrate improved functional strength and transfer efficiency. Baseline: 14.44 sec Goal status: IN PROGRESS  5.  Pt will verbalize plans for continued community fitness upon d/c from PT to maximize gains made in PT. Baseline:  Goal status: IN PROGRESS  6.  Pt will ambulate at least 200 ft in 2 minute walk, for improved gait efficiency and endurance.  Baseline: 100 ft in 1:30 (9/265/2025)  Goal status:  IN PROGRESS    ASSESSMENT:  CLINICAL IMPRESSION: Pt presents today and requests caregiver stay to fully go through HEP.  Went over pt's HEP (she brought in handouts) and had caregiver go through exercises with band and weights.  Discussed and updated HEP to reflect standing glut sets and walking several times per day at home).  Short distant gait in session with cane and with 4WW is 1 min-1:30 and stopped due to back/hip pain.   Pt's pain is a limiting factor in progression of gait and standing activities in PT sessions.  Pt will continue to benefit from skilled PT towards goals for improved functional mobility and decreased fall risk.   OBJECTIVE IMPAIRMENTS: Abnormal gait, decreased balance, decreased mobility, difficulty walking, decreased strength, impaired sensation, postural dysfunction, and pain.   ACTIVITY LIMITATIONS: sitting, standing, transfers, locomotion level, and caring for others  PARTICIPATION LIMITATIONS: driving, shopping, community activity, and travel  PERSONAL FACTORS: 3+ comorbidities: see PMH above are also affecting patient's functional outcome.   REHAB POTENTIAL:  Good  CLINICAL DECISION MAKING: Evolving/moderate complexity  EVALUATION COMPLEXITY: Moderate  PLAN:  PT FREQUENCY: 2x/week  PT DURATION: 8 weeks plus eval visit  PLANNED INTERVENTIONS: [id number]- PT Re-evaluation, 97750- Physical Performance Testing, 97110-Therapeutic exercises, 97530- Therapeutic activity, [id number]- Neuromuscular re-education, 97535- Self Care, [id number]- Manual therapy, [id number]- Gait training, [phone]- Canalith repositioning, [id number]- Aquatic Therapy, Patient/Family education, and Balance training  PLAN FOR NEXT SESSION: Check  LTGs Need to discuss POC and decide recert/discharge or recert/continue (with caregiver present and education today, I forgot to discuss POC). Try to progress standing and gait tolerance; RLE strength for car transfers; pt requests not to perform Nustep and physioball activities d/t hip pain; pt reports that she would like to work towards being able to walk down her ramp and into her car-work on outdoor sidewalk distance to simulate ramp  AQUATICS: Frequency: 1x/week Duration: 4 weeks  Special Instruction: chronic hip and back pain, imbalance   Aquatics:  continue core work - try ankle weights/aquatic cuffs in sitting    Greig Anon, PT 04/12/24 8:33 PM Phone: [phone] Fax: [phone]  [facility] at [facility] [address] [place], [place] [address] Phone # [phone] Fax # [phone]  [facility] at [facility] [address] [place], [place] [address] Phone # [phone] Fax # [phone]

## 2024-04-15 NOTE — Progress Notes (Unsigned)
 Subjective:  Chief complaint: follow-up for hardware associated diskiits and vertebral osteomyelitis  followup for diskitis, hardware associated osteomyelitis   Patient ID: [name], female    DOB: Feb 18, 1956, 68 y.o.   MRN: [id number]  HPI  68  y.o. female with hx of scoliosis surgery with extensive lower thoracic and lumbar fusion roughly 15yrs ago, she suffered ground level fall in late November, where she fractures through all three columns of L3, thus went to OR on 12/9 for stabilization, new  posterolateral instrumentation fusion on June 26, 2022 complicated by Wound dehiscence with exposed hardware and unstable lumbar spine fracture status post revision of lumbar wound with removal of L1-3 4 and 5 lumbar screws and rod and removal of right L1 screw and revision of L1-L5 posterior instrumented fusion on August 09, 2022 and now concern for postoperative wound infection and ventriculitis. She had complicated protracted course in the icu, but eventually was discharged to CIR on vancomycin  and cefepime  through 3/14 for ventriculitis and hw complicating wound infection. Now changed to doxcycyline plus levofloxacin  as of 3/15 while she continued to need medihoney with moistened quaze with sliver hydrofiber to the caudal portion of wound.   Mri of brain on 09/29/22- resolution of ventriculitis.    Interim history:  Back pain did not appear to be worse but wound did persist and apparently tunneled cranially.  Her white blood cell count with [facility] was in the 16,000 range an abrupt change from her last labs as an inpatient when it was in the 7000 range.  He did have a mechanical fall and fell on her left knee where she has a prosthetic joint.  When I last saw her we rechecked her inflammatory markers and sed rate had titrated down though CRP was going up to 15.6  WBC recheck with our lab was 12,000.  In the interim she has been seen by Dr. Rockney with  neurosurgery and CT of the thoracic and lumbar spine were performed.  These have shown: A new T10 compression fracture with 12% loss of height with unchanged change adjacent chronic T10-T11 T12-L1 posterior element ankylosis with chronic nonfusion at T11-T12, L3 superior endplate nonacute fracture and subtle horizontal fracture of the chronic posterior element of the bone, difficult exclude nondisplaced S3-4 sacral fracture with mild presacral stranding.   Checked labs last time we saw her white blood cell count remained elevated 12,000.  Sed rate and CRP [place] sed rate actually normalized and CRP had gone up.  Changed her evofloxacin and doxycycline  to cefdinir  and doxycycline .  She is continuing to take these.  She did develop a painful erythematous rash on her hands due to doxycycline  use in the context of sun exposure at the beach.  This is subsequently resolved she has a new somewhat purpuric rash on her right arm that came up in the last few days  She returns to clinic today for follow-up  Discussed the use of AI scribe software for clinical note transcription with the patient, who gave verbal consent to proceed.  At her last visit with me on July 14, 2023 the patient had developed worsening pain in the thoracic spine and hip.  She was also with reduced strength and mobility with having to to now use a walker compared to a cane.  The patient had an MRI of the thoracic and lumbar spine pending after an initial scan done with [facility] had suggested potential infection in the spine.  She also has been  found to have a 8 mm solid mass in the right lung on CT scan.  The MRI was ultimately read and showed  IMPRESSION: 1. Fluid signal within the L3-L4 and L4-L5 discs is suspicious for discitis-osteomyelitis. Progressed bone marrow edema within the L4 vertebral body and bilateral pedicles. There is also increased bone marrow edema at the superior endplate of L5 and  right L5 pedicle. 2. Possible subacute superior endplate fractures of the L4 and L5 vertebral bodies without associated height loss. 3. Minimal residual marrow edema associated with L3 superior endplate fracture. 4. 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  The case was discussed with Dr. Carollee and we both agreed that it would be prudent to have the patient's stop antibiotics and pursue IR guided biopsy for culture after she had been off antibiotics for several weeks.  I called the patient and her daughter on the 26th in the evening and asked that she stop antibiotics so that we could get a culture after she had been off antibiotics for several weeks.  She did not stop antibiotics that night.  In the interim she did have worsening of her back pain she underwent IR guided biopsy with culture yesterday with Gram stain having been negative no organisms isolated yet to date.  With regards to the her lung nodule we have done lab work here including a cryptococcal antigen in the serum which is negative urine histoplasma antigen and Blastomyces antigen.  She has been seen by Dr. Shelah who noted that there was a pneumonia in the area where the lung nodule was seen and he wondered if this finding read as a nodule that might instead represent scarring.  He wanted to pursue further imaging with a PET scan before considering bronchoscopic be with BAL and biopsies.  At her visit with me on December 26th, 2024 patient had worsening pain in an thoracic and lumbar spine after initial scan with [facility] had suggested potential infection in the spine.  She was then found to have an 8 mm solid mass in the right lung on CT scan MRI was ultimately read and showed fluid signal within the L3-L4 and L4-L5 disc suspicious for discitis osteomyelitis with progressed bone marrow edema in the L4 vertebral body and bilateral pedicles also with increased bone marrow edema in the superior  endplate of L5 and right L5 pedicle with possible versus subacute.  Endplate fractures in L4 and L5 with minimal residual bone marrow associated L3 superior endplate fracture with chronic findings of arachnoiditis in the lower lumbar spine.  I called the patient and her daughter on the 77 and had them stop antibiotics with plans for IR guided biopsy for culture.  This was accomplished after they had held antibiotics for several weeks.  Cultures were unrevealing.  We subsequently started her on yet another empiric regimen of oral doxycycline  and cefadroxil  until we could get a line placed at that point in time we placed her on IV daptomycin and cefepime .  In the interim she had complained of vaginal discharge though now I have found out that she has a rectovaginal fistula which could certainly explain that problem.  She then began to complain of malaise with labile blood pressures and lightheadedness nauseousness with tachycardia.    The patient thought there is due to side effects of IV antibiotics and we instructed  her hold IV cefepime  and continue myosin but she actually did the reverse and stop the daptomycin and continued cefepime .  Recently yesterday when she was seen with cardiology she was hypotensive seated in the chair in the 60s and apparently they had recommended her being seen in the emergency room and potentially hospitalized.  She was given fluid bolus there with improvement in her blood pressure and her losartan  is on hold  Her back pain does not seem to be worse she has no respiratory complaints she is not complaining now of much in the way of nausea or abdominal pain.  She is does continue to have discharge from her rectovaginal fistula and also gas that will emanate from this.  I am quite worried that I have other intra-abdominal pathology such as intra-abdominal abscess or fistula to another organ that is causing all of her symptoms of malaise palpitations  nausea.  Review of her labs from home health showed slight leukocytosis of white count to 14,000 with slight eosinophilia though not terribly much above the normal range her inflammatory markers have come down in the last 2 weeks from 72-23 in the case of her sed rate and 30 116 and 51 in terms of her CRP.  She had been seen by urogynecology who had found frank stool in the vagina from the fistula.  They recommended her being seen by general surgery and wanted a CT scan of the abdomen pelvis performed I think getting a CT scan abdomen pelvis is critical and I would feel better if we could have her directly admitted to the hospital to speed up the process of getting a CT of the abdomen pelvis because I am worried that she may have more pathology than the rectovaginal fistula such as an intra-abdominal abscess driving symptoms.  However Dickey is emphatic about going back into the hospital if at all possible.    She had her CT abdomen pelvis performed which showed the following:  IMPRESSION: Image degradation in lower pelvis due to bilateral hip prostheses and rectal tube.   Findings highly suspicious for gas-containing rectovaginal fistula,  With linear gas collection between the anterior wall of the sigmoid colon and bladder to the left of midline which descends to the region of the vaginal cuff (see image 83/12), highly suspicious for rectovaginal fistula.   Diffuse colonic diverticulosis, with muscular hypertrophy and sigmoid colon. No radiographic evidence of acute diverticulitis.   Multiple ill-defined ground-glass and subsolid nodular opacities in the visualized portions of the right middle and lower lobes. This is almost certainly inflammatory or infectious in etiology given that it is a new compared to recent PET-CT 1 month ago. Recommend clinical correlation, and continued follow-up by chest CT.    I saw the report of the CT scan with regards to the amatory groundglass  opacity had some anxiety about whether she could be having some daptomycin induced eosinophilic pneumonia.  The patient however has no respiratory symptoms other than some chronic dyspnea that preceded her being on antibiotic she has no cough.  Her CBC shows no peripheral eosinophilia and I do not think she has daptomycin induced developed pneumonia.  She still has GI upset but is been able to take her daptomycin and cefepime  with planned stop date of 18 March.  Her inflammatory markers have been a bit of a mixed picture with her sed rate having gone up to 59 from a value of 23 but that was down from 72 her CRP has come down consistently now at 14 from 51 and 116 prior to that her back pain seems relatively stable though it is worse  than it was when she first was dealing with her ventriculitis.  She does have a compression fracture as well.  Surgery is being contemplated by her urogynecologist potentially with general surgery involvement as well.  In talking the patient and her daughter it seems as if the surgeons want her back infection declared cured prior to operating.  I do not think that she should be necessary because her spine if she still harbors infection is of much lower risk to her as far as uncontrolled infection than her rectovaginal fistula.   I have a much greater anxiety her nto have source control in her abdomen and pelvis.     Her pain had been significantly worse at last visit we ordered MRI of the spine:  The scan was read as follows    IMPRESSION: 1. Postoperative changes of T11-S1 laminectomy and posterior spinal fusion with right posterior spinal instrumentation from L2-L5. On prior CT, there is evidence of fracture through the left posterior fusion mass at L4-5, correlating with edema is seen in the left pedicle of L4 on today's study. 2. Decreased edema within the L4 and L5 vertebral bodies and posterior elements, as well as within the L4-5 and L3-4 discs, likely  reactive and related to the above fracture. 3. Loculated simple fluid within the thecal sac with some marginalized cauda equina nerve roots, most consistent with arachnoiditis. 4. No spinal canal stenosis or neural foraminal narrowing.   Patient was seen by Dr. Elspeth Schultze who scheduled her for surgery and on  12/01/2023 she underwent  -ROBOTIC LOW ANTERIOR RECTOSIGMOID RESECTION (LAR) WITH ANASTOMOSIS -COLOVAGINAL FISTULA TAKEDOWN & REPAIR -OMENTOPEXY OF VAGINA -INTRAOPERATIVE ASSESSMENT OF TISSUE VASCULAR PERFUSION USING ICG (indocyanine green ) IMMUNOFLUORESCENCE -TRANSVERSUS ABDOMINIS PLANE (TAP) BLOCK - BILATERAL -FLEXIBLE SIGMOIDOSCOPY   Urology did joint surgery and did   1.  Cystoscopy with bilateral ureteral FireFly injections 2.  Transurethral resection of  possible bladder tumor--large  Pathology came back with:  A. BLADDER TUMOR, TRANSURETHERAL RESECTON:  Florid cystitis cystica and papillary cystitis  Negative for carcinoma   B. RECTOSIGMOID COLON RESECTION:  Diverticula with chronic inflammation, perforation, fistula and reactive  fibrosis  Negative for carcinoma  Five benign lymph nodes (0/5)   C. FINAL DISTAL MARGIN:  Benign colonic tissue   When I last saw her to go on a beach trip and was concerned about doxycycline  causing severe sun exposed rash.  We had her stop the doxycycline  when she was on the beach trip asked her to pay attention to her back pain and it did not worsen while the beach trip but she resumed doxycycline  along with cefadroxil  when she returned.  Discussed the use of AI scribe software for clinical note transcription with the patient, who gave verbal consent to proceed.  History of Present Illness                           Past Medical History:  Diagnosis Date   Anemia    Anxiety    Arthritis    Bipolar disorder (HCC)    Cardiomyopathy (HCC)    Complication of anesthesia    Awake but could not move   Depression     Diskitis 10/04/2023   GERD (gastroesophageal reflux disease)    Heart failure (HCC)    Acute   Heart murmur    related to VSD   High cholesterol    History of blood transfusion    related to OR (  08/19/2016)   History of hiatal hernia    Hypertension    Hyperthyroidism    Hypotension 09/06/2023   Mild cognitive impairment 09/06/2018   Paroxysmal ventricular tachycardia (HCC)    Pneumonia    Rectovaginal fistula 09/06/2023   Tachycardia, unspecified    Type II diabetes mellitus (HCC)    UTI (urinary tract infection)    being treated with Keflex    Ventricular septal defect    Ventriculitis of brain due to bacteria 11/02/2022   Vertebral fracture, osteoporotic (HCC) 01/06/2023    Past Surgical History:  Procedure Laterality Date   ABDOMINAL HYSTERECTOMY     BACK SURGERY     CARDIAC CATHETERIZATION N/A 06/23/2015   Procedure: Left Heart Cath and Coronary Angiography;  Surgeon: Ezra GORMAN Shuck, MD;  Location: [facility] INVASIVE CV LAB;  Service: Cardiovascular;  Laterality: N/A;   CARDIAC CATHETERIZATION  1960   VSD was so small; didn't need repaired   COLECTOMY, SIGMOID, ROBOT-ASSISTED N/A 11/30/2023   Procedure: COLECTOMY, SIGMOID, ROBOT-ASSISTED ROBOTIC LOW ANTERIOR RECTOSIGMOID RESECTION (LAR) WITH ANASTOMOSIS COLOVAGINAL FISTULA TAKEDOWN & REPAIR OMENTOPEXY OF VAGINA INTRAOPERATIVE ASSESSMENT OF TISSUE VASCULAR PERFUSION USING ICG (indocyanine green ) IMMUNOFLUORESCENCE TRANSVERSUS ABDOMINIS PLANE (TAP) BLOCK - BILATERAL;  Surgeon: Sheldon Standing, MD;  Location: [facility] ORS;  Service: General;  Late   CYSTOSCOPY WITH INDOCYANINE GREEN  IMAGING (ICG) N/A 11/30/2023   Procedure: Cystoscopy with bilateral ureteral FireFly injections Transurethral resection of bladder tumor--large;  Surgeon: Carolee Sherwood JONETTA DOUGLAS, MD;  Location: [facility];  Service: Urology;  Laterality: N/A;   EXAM UNDER ANESTHESIA WITH MANIPULATION OF HIP Right 06/02/2014   dr sheril   FLEXIBLE SIGMOIDOSCOPY N/A 11/30/2023    Procedure: KINGSTON SIDE;  Surgeon: Sheldon Standing, MD;  Location: [facility];  Service: General;  Laterality: N/A;   FRACTURE SURGERY     HERNIA REPAIR     HIP CLOSED REDUCTION Right 06/02/2014   Procedure: CLOSED MANIPULATION HIP;  Surgeon: Maude KANDICE sheril, MD;  Location: [facility] OR;  Service: Orthopedics;  Laterality: Right;   IR FLUORO GUIDED NEEDLE PLC ASPIRATION/INJECTION LOC  08/09/2023   JOINT REPLACEMENT     JOINT REPLACEMENT     POSTERIOR LUMBAR FUSION 4 LEVEL N/A 06/26/2022   Procedure: Lumbar One To Lumbar Five Posterior Instrumented Fusion;  Surgeon: Cheryle Debby LABOR, MD;  Location: [facility] OR;  Service: Neurosurgery;  Laterality: N/A;   REFRACTIVE SURGERY Bilateral    SHOULDER ARTHROSCOPY Right    SHOULDER OPEN ROTATOR CUFF REPAIR Right    SPINAL FUSION  1996   t10 down to my coccyx   SPINE HARDWARE REMOVAL     TOTAL ABDOMINAL HYSTERECTOMY     TOTAL HIP ARTHROPLASTY Right 05/10/2014   [place]      by dr lonni olcott   TOTAL KNEE ARTHROPLASTY Left    TOTAL SHOULDER ARTHROPLASTY Left 08/19/2016   Procedure: TOTAL SHOULDER ARTHROPLASTY;  Surgeon: Eva Herring, MD;  Location: [facility] OR;  Service: Orthopedics;  Laterality: Left;  Left total shoulder replacement    Family History  Problem Relation Age of Onset   Other Mother        alive   Stroke Father 2       deceased   Dementia Father    Chorea Maternal Grandfather    Dementia Maternal Aunt    Dementia Maternal Aunt    Heart attack Other        multiple uncles have died with myocardial infarction   Bladder Cancer Neg Hx    Uterine cancer Neg  Hx       Social History   Socioeconomic History   Marital status: Married    Spouse name: Not on file   Number of children: Not on file   Years of education: Not on file   Highest education level: Master's degree (e.g., MA, MS, MEng, MEd, MSW, MBA)  Occupational History   Occupation: Magazine features editor: SELF-EMPLOYED    Comment: former  Tobacco Use    Smoking status: Former    Types: Cigarettes    Passive exposure: Never   Smokeless tobacco: Never   Tobacco comments:    03/30/21 smokes 2 cigs daily.      08/06/22 GLENWOOD Raisin stated that the patient is not smoking while at [facility] for rehab. KM    11-23-23 has been quit for 3 weeks  Vaping Use   Vaping status: Never Used  Substance and Sexual Activity   Alcohol use: No   Drug use: No   Sexual activity: Not Currently    Partners: Male    Birth control/protection: Surgical    Comment: Hysterectomy  Other Topics Concern   Not on file  Social History Narrative   Right handed   Caffeine  2-3 cups daily    Lives at home with husband    Social Drivers of Health   Financial Resource Strain: Low Risk  (07/08/2023)   Received from [facility]   Overall Financial Resource Strain (CARDIA)    Difficulty of Paying Living Expenses: Not hard at all  Food Insecurity: No Food Insecurity (11/30/2023)   Hunger Vital Sign    Worried About Running Out of Food in the Last Year: Never true    Ran Out of Food in the Last Year: Never true  Transportation Needs: No Transportation Needs (11/30/2023)   PRAPARE - Administrator, Civil Service (Medical): No    Lack of Transportation (Non-Medical): No  Physical Activity: Not on file  Stress: Not on file  Social Connections: Socially Integrated (11/30/2023)   Social Connection and Isolation Panel    Frequency of Communication with Friends and Family: Twice a week    Frequency of Social Gatherings with Friends and Family: Twice a week    Attends Religious Services: 1 to 4 times per year    Active Member of [employer] or Organizations: No    Attends Engineer, structural: 1 to 4 times per year    Marital Status: Married    Allergies  Allergen Reactions   Gallium (Radioactive Isotope) Swelling    Pt states she can not have radioactive injections.    Paxlovid [Nirmatrelvir-Ritonavir]      Current Outpatient  Medications:    acetaminophen  (TYLENOL ) 325 MG tablet, Take 1-2 tablets (325-650 mg total) by mouth every 6 (six) hours as needed for mild pain or headache., Disp: , Rfl:    ascorbic acid  (VITAMIN C ) 500 MG tablet, Take 500 mg by mouth daily., Disp: , Rfl:    ASPIRIN  81 PO, Take 81 mg by mouth daily., Disp: , Rfl:    buPROPion  (WELLBUTRIN  XL) 150 MG 24 hr tablet, Take 1 tablet (150 mg total) by mouth daily., Disp: 30 tablet, Rfl: 0   busPIRone  (BUSPAR ) 7.5 MG tablet, Take 7.5 mg by mouth 2 (two) times daily., Disp: , Rfl:    Calcium  Carb-Cholecalciferol  (CALCIUM  600+D3) 600-20 MG-MCG TABS, Take 1 tablet by mouth in the morning and at bedtime., Disp: , Rfl:    carvedilol  (COREG ) 25 MG  tablet, Take 1 tablet (25 mg total) by mouth 2 (two) times daily with a meal., Disp: 60 tablet, Rfl: 0   Cholecalciferol  (VITAMIN D3) 50 MCG (2000 UT) capsule, Take 2,000 Units by mouth daily., Disp: , Rfl:    cyclobenzaprine  (FLEXERIL ) 10 MG tablet, Take 10 mg by mouth 3 (three) times daily., Disp: , Rfl:    denosumab (PROLIA) 60 MG/ML SOSY injection, Inject 60 mg into the skin every 6 (six) months., Disp: , Rfl:    diphenoxylate -atropine  (LOMOTIL ) 2.5-0.025 MG tablet, Take 1 tablet by mouth 2 (two) times daily as needed for diarrhea or loose stools., Disp: 30 tablet, Rfl: 0   donepezil  (ARICEPT ) 10 MG tablet, Take 10 mg by mouth every morning., Disp: , Rfl:    doxycycline  (VIBRA -TABS) 100 MG tablet, Take 1 tablet (100 mg total) by mouth 2 (two) times daily., Disp: 60 tablet, Rfl: 11   empagliflozin  (JARDIANCE ) 10 MG TABS tablet, Take 1 tablet (10 mg total) by mouth daily before breakfast., Disp: 90 tablet, Rfl: 3   estradiol  (ESTRACE ) 0.1 MG/GM vaginal cream, Place 0.5g nightly for two weeks then twice a week after, Disp: 30 g, Rfl: 3   famotidine  (PEPCID ) 20 MG tablet, Take 1 tablet (20 mg total) by mouth 2 (two) times daily. (Patient taking differently: Take 20 mg by mouth every evening.), Disp: 60 tablet, Rfl: 0    fenoprofen (NALFON ) 600 MG TABS tablet, Take 600 mg by mouth in the morning and at bedtime., Disp: , Rfl:    fluvoxaMINE  (LUVOX ) 100 MG tablet, Take 1 tablet (100 mg total) by mouth at bedtime., Disp: 30 tablet, Rfl: 0   gabapentin  (NEURONTIN ) 100 MG capsule, Take 1 capsule (100 mg total) by mouth 3 (three) times daily., Disp: 90 capsule, Rfl: 0   Lactobacillus (ACIDOPHILUS) 100 MG CAPS, Take 1 capsule (100 mg total) by mouth 2 (two) times daily. (Patient taking differently: Take 250 mg by mouth daily.), Disp: 60 capsule, Rfl: 0   lamoTRIgine  (LAMICTAL ) 100 MG tablet, Take 200 mg by mouth at bedtime., Disp: , Rfl:    levothyroxine  (SYNTHROID ) 88 MCG tablet, Take 1 tablet (88 mcg total) by mouth daily before breakfast., Disp: 30 tablet, Rfl: 0   losartan  (COZAAR ) 25 MG tablet, Take 0.5 tablets (12.5 mg total) by mouth daily., Disp: , Rfl:    Melatonin 10 MG TABS, Take 10 mg by mouth at bedtime., Disp: , Rfl:    metFORMIN  (GLUCOPHAGE ) 500 MG tablet, Take 1 tablet (500 mg total) by mouth 2 (two) times daily with a meal., Disp: 60 tablet, Rfl: 0   Multiple Vitamin (MULTIVITAMIN) tablet, Take 1 tablet by mouth daily.  , Disp: , Rfl:    Multiple Vitamins-Minerals (ZINC PO), Take 50 mg by mouth daily., Disp: , Rfl:    nicotine  (NICODERM CQ  - DOSED IN MG/24 HOURS) 21 mg/24hr patch, Place 21 mg onto the skin daily., Disp: , Rfl:    nicotine  polacrilex (NICORETTE ) 4 MG gum, Take 4 mg by mouth as needed for smoking cessation. (Patient not taking: Reported on 03/07/2024), Disp: , Rfl:    ondansetron  (ZOFRAN ) 4 MG tablet, Take 1 tablet (4 mg total) by mouth every 8 (eight) hours as needed for refractory nausea / vomiting., Disp: 20 tablet, Rfl: 0   pantoprazole  (PROTONIX ) 40 MG tablet, Take 40 mg by mouth every morning., Disp: , Rfl:    potassium chloride  (KLOR-CON ) 10 MEQ tablet, Take 20 mEq by mouth 3 (three) times daily., Disp: , Rfl:  rosuvastatin  (CRESTOR ) 20 MG tablet, Take 20 mg by mouth daily., Disp: ,  Rfl:    sodium chloride  1 g tablet, Take 1 tablet (1 g total) by mouth 2 (two) times daily with a meal., Disp: 60 tablet, Rfl: 0   Suzetrigine  50 MG TABS, Take 50 mg by mouth every 12 (twelve) hours as needed. Take two 50mg  capsules for your first dose at the start of a pain episode on an empty stomach, followed by 50mg  (1 capsule)  with or without food every 12 hours until pain episode is improved, Disp: 30 tablet, Rfl: 1   traZODone  (DESYREL ) 100 MG tablet, Take 1 tablet (100 mg total) by mouth at bedtime., Disp: 30 tablet, Rfl: 0   VRAYLAR  1.5 MG capsule, Take 1.5 mg by mouth at bedtime., Disp: , Rfl:    Review of Systems  Constitutional:  Negative for activity change, appetite change, chills, diaphoresis, fatigue, fever and unexpected weight change.  HENT:  Negative for congestion, rhinorrhea, sinus pressure, sneezing, sore throat and trouble swallowing.   Eyes:  Negative for photophobia and visual disturbance.  Respiratory:  Negative for cough, chest tightness, shortness of breath, wheezing and stridor.   Cardiovascular:  Negative for chest pain, palpitations and leg swelling.  Gastrointestinal:  Negative for abdominal distention, abdominal pain, anal bleeding, blood in stool, constipation, diarrhea, nausea and vomiting.  Genitourinary:  Negative for difficulty urinating, dysuria, flank pain and hematuria.  Musculoskeletal:  Negative for arthralgias, back pain, gait problem, joint swelling and myalgias.  Skin:  Negative for color change, pallor, rash and wound.  Neurological:  Negative for dizziness, tremors, weakness and light-headedness.  Hematological:  Negative for adenopathy. Does not bruise/bleed easily.  Psychiatric/Behavioral:  Negative for agitation, behavioral problems, confusion, decreased concentration, dysphoric mood and sleep disturbance.        Objective:   Physical Exam Constitutional:      General: She is not in acute distress.    Appearance: Normal appearance. She is  well-developed. She is not ill-appearing or diaphoretic.  HENT:     Head: Normocephalic and atraumatic.     Right Ear: Hearing and external ear normal.     Left Ear: Hearing and external ear normal.     Nose: No nasal deformity or rhinorrhea.  Eyes:     General: No scleral icterus.    Conjunctiva/sclera: Conjunctivae normal.     Right eye: Right conjunctiva is not injected.     Left eye: Left conjunctiva is not injected.     Pupils: Pupils are equal, round, and reactive to light.  Neck:     Vascular: No JVD.  Cardiovascular:     Rate and Rhythm: Normal rate and regular rhythm.     Heart sounds: Normal heart sounds, S1 normal and S2 normal. No murmur heard.    No friction rub.  Abdominal:     General: Bowel sounds are normal. There is no distension.     Palpations: Abdomen is soft.     Tenderness: There is no abdominal tenderness.  Musculoskeletal:        General: Normal range of motion.     Right shoulder: Normal.     Left shoulder: Normal.     Cervical back: Normal range of motion and neck supple.     Right hip: Normal.     Left hip: Normal.     Right knee: Normal.     Left knee: Normal.  Lymphadenopathy:     Head:     Right  side of head: No submandibular, preauricular or posterior auricular adenopathy.     Left side of head: No submandibular, preauricular or posterior auricular adenopathy.     Cervical: No cervical adenopathy.     Right cervical: No superficial or deep cervical adenopathy.    Left cervical: No superficial or deep cervical adenopathy.  Skin:    General: Skin is warm and dry.     Coloration: Skin is not pale.     Findings: No abrasion, bruising, ecchymosis, erythema, lesion or rash.     Nails: There is no clubbing.  Neurological:     Mental Status: She is alert and oriented to person, place, and time.     Sensory: No sensory deficit.     Coordination: Coordination normal.     Gait: Gait normal.  Psychiatric:        Attention and Perception: She is  attentive.        Speech: Speech normal.        Behavior: Behavior normal. Behavior is cooperative.        Thought Content: Thought content normal.        Judgment: Judgment normal.        Assessment & Plan:

## 2024-04-16 ENCOUNTER — Other Ambulatory Visit: Payer: Self-pay

## 2024-04-16 ENCOUNTER — Encounter: Payer: Self-pay | Admitting: Infectious Disease

## 2024-04-16 ENCOUNTER — Ambulatory Visit (INDEPENDENT_AMBULATORY_CARE_PROVIDER_SITE_OTHER): Admitting: Infectious Disease

## 2024-04-16 VITALS — BP 146/85 | HR 77 | Temp 97.8°F

## 2024-04-16 DIAGNOSIS — T847XXD Infection and inflammatory reaction due to other internal orthopedic prosthetic devices, implants and grafts, subsequent encounter: Secondary | ICD-10-CM | POA: Diagnosis not present

## 2024-04-16 DIAGNOSIS — T847XXS Infection and inflammatory reaction due to other internal orthopedic prosthetic devices, implants and grafts, sequela: Secondary | ICD-10-CM | POA: Diagnosis not present

## 2024-04-16 DIAGNOSIS — G0490 Encephalitis and encephalomyelitis, unspecified: Secondary | ICD-10-CM

## 2024-04-16 DIAGNOSIS — B9689 Other specified bacterial agents as the cause of diseases classified elsewhere: Secondary | ICD-10-CM | POA: Diagnosis not present

## 2024-04-16 DIAGNOSIS — N823 Fistula of vagina to large intestine: Secondary | ICD-10-CM

## 2024-04-16 DIAGNOSIS — M4645 Discitis, unspecified, thoracolumbar region: Secondary | ICD-10-CM | POA: Diagnosis not present

## 2024-04-16 DIAGNOSIS — R911 Solitary pulmonary nodule: Secondary | ICD-10-CM | POA: Diagnosis not present

## 2024-04-16 DIAGNOSIS — M4850XD Collapsed vertebra, not elsewhere classified, site unspecified, subsequent encounter for fracture with routine healing: Secondary | ICD-10-CM | POA: Diagnosis not present

## 2024-04-16 DIAGNOSIS — I5042 Chronic combined systolic (congestive) and diastolic (congestive) heart failure: Secondary | ICD-10-CM

## 2024-04-16 MED ORDER — CEFADROXIL 500 MG PO CAPS: 1000.0000 mg | ORAL_CAPSULE | Freq: Two times a day (BID) | ORAL | 11 refills | Status: AC

## 2024-04-17 ENCOUNTER — Encounter: Payer: Self-pay | Admitting: Rehabilitation

## 2024-04-17 ENCOUNTER — Ambulatory Visit: Admitting: Rehabilitation

## 2024-04-17 ENCOUNTER — Ambulatory Visit: Admitting: Physical Therapy

## 2024-04-17 DIAGNOSIS — R2681 Unsteadiness on feet: Secondary | ICD-10-CM

## 2024-04-17 DIAGNOSIS — M6281 Muscle weakness (generalized): Secondary | ICD-10-CM

## 2024-04-17 DIAGNOSIS — R2689 Other abnormalities of gait and mobility: Secondary | ICD-10-CM

## 2024-04-17 LAB — CBC WITH DIFFERENTIAL/PLATELET
Absolute Lymphocytes: 1782 {cells}/uL (ref 850–3900)
Absolute Monocytes: 743 {cells}/uL (ref 200–950)
Basophils Absolute: 99 {cells}/uL (ref 0–200)
Basophils Relative: 1 %
Eosinophils Absolute: 386 {cells}/uL (ref 15–500)
Eosinophils Relative: 3.9 %
HCT: 42.4 % (ref 35.0–45.0)
Hemoglobin: 13.8 g/dL (ref 11.7–15.5)
MCH: 30.6 pg (ref 27.0–33.0)
MCHC: 32.5 g/dL (ref 32.0–36.0)
MCV: 94 fL (ref 80.0–100.0)
MPV: 9.2 fL (ref 7.5–12.5)
Monocytes Relative: 7.5 %
Neutro Abs: 6890 {cells}/uL (ref 1500–7800)
Neutrophils Relative %: 69.6 %
Platelets: 389 Thousand/uL (ref 140–400)
RBC: 4.51 Million/uL (ref 3.80–5.10)
RDW: 14.2 % (ref 11.0–15.0)
Total Lymphocyte: 18 %
WBC: 9.9 Thousand/uL (ref 3.8–10.8)

## 2024-04-17 LAB — BASIC METABOLIC PANEL WITHOUT GFR
BUN: 13 mg/dL (ref 7–25)
CO2: 25 mmol/L (ref 20–32)
Calcium: 9.6 mg/dL (ref 8.6–10.4)
Chloride: 101 mmol/L (ref 98–110)
Creat: 0.59 mg/dL (ref 0.50–1.05)
Glucose, Bld: 133 mg/dL — ABNORMAL HIGH (ref 65–99)
Potassium: 4.7 mmol/L (ref 3.5–5.3)
Sodium: 137 mmol/L (ref 135–146)

## 2024-04-17 LAB — C-REACTIVE PROTEIN: CRP: <3 mg/L (ref <8.0)

## 2024-04-17 LAB — SEDIMENTATION RATE: Sed Rate: 9 mm/h (ref 0–30)

## 2024-04-17 NOTE — Therapy (Signed)
 OUTPATIENT PHYSICAL THERAPY NEURO TREATMENT NOTE   Patient Name: [name] MRN: [id number] DOB:06-08-1956, 68 y.o., female Today's Date: 04/17/2024   PCP: Rolinda Millman, MD  REFERRING PROVIDER: Urbano Albright, MD    END OF SESSION:  PT End of Session - 04/17/24 0903     Visit Number 17    Number of Visits 17    Date for Recertification  04/19/24    Authorization Type BCBS Medicare    Progress Note Due on Visit 10    PT Start Time 1010    PT Stop Time 1100    PT Time Calculation (min) 50 min    Equipment Utilized During Treatment Other (comment)   floatation devices as needed for safety   Activity Tolerance Patient tolerated treatment well    Behavior During Therapy WFL for tasks assessed/performed               Past Medical History:  Diagnosis Date   Anemia    Anxiety    Arthritis    Bipolar disorder (HCC)    Cardiomyopathy (HCC)    Complication of anesthesia    Awake but could not move   Depression    Diskitis 10/04/2023   GERD (gastroesophageal reflux disease)    Heart failure (HCC)    Acute   Heart murmur    related to VSD   High cholesterol    History of blood transfusion    related to OR (08/19/2016)   History of hiatal hernia    Hypertension    Hyperthyroidism    Hypotension 09/06/2023   Mild cognitive impairment 09/06/2018   Paroxysmal ventricular tachycardia (HCC)    Pneumonia    Rectovaginal fistula 09/06/2023   Tachycardia, unspecified    Type II diabetes mellitus (HCC)    UTI (urinary tract infection)    being treated with Keflex    Ventricular septal defect    Ventriculitis of brain due to bacteria 11/02/2022   Vertebral fracture, osteoporotic (HCC) 01/06/2023   Past Surgical History:  Procedure Laterality Date   ABDOMINAL HYSTERECTOMY     BACK SURGERY     CARDIAC CATHETERIZATION N/A 06/23/2015   Procedure: Left Heart Cath and Coronary Angiography;  Surgeon: Ezra GORMAN Shuck, MD;  Location: [facility] INVASIVE CV LAB;   Service: Cardiovascular;  Laterality: N/A;   CARDIAC CATHETERIZATION  1960   VSD was so small; didn't need repaired   COLECTOMY, SIGMOID, ROBOT-ASSISTED N/A 11/30/2023   Procedure: COLECTOMY, SIGMOID, ROBOT-ASSISTED ROBOTIC LOW ANTERIOR RECTOSIGMOID RESECTION (LAR) WITH ANASTOMOSIS COLOVAGINAL FISTULA TAKEDOWN & REPAIR OMENTOPEXY OF VAGINA INTRAOPERATIVE ASSESSMENT OF TISSUE VASCULAR PERFUSION USING ICG (indocyanine green ) IMMUNOFLUORESCENCE TRANSVERSUS ABDOMINIS PLANE (TAP) BLOCK - BILATERAL;  Surgeon: Sheldon Standing, MD;  Location: [facility] ORS;  Service: General;  Late   CYSTOSCOPY WITH INDOCYANINE GREEN  IMAGING (ICG) N/A 11/30/2023   Procedure: Cystoscopy with bilateral ureteral FireFly injections Transurethral resection of bladder tumor--large;  Surgeon: Carolee Sherwood JONETTA DOUGLAS, MD;  Location: [facility] OR[facility];  Service: Urology;  Laterality: N/A;   EXAM UNDER ANESTHESIA WITH MANIPULATION OF HIP Right 06/02/2014   dr sheril   FLEXIBLE SIGMOIDOSCOPY N/A 11/30/2023   Procedure: KINGSTON SIDE;  Surgeon: Sheldon Standing, MD;  Location: [facility] OR[facility];  Service: General;  Laterality: N/A;   FRACTURE SURGERY     HERNIA REPAIR     HIP CLOSED REDUCTION Right 06/02/2014   Procedure: CLOSED MANIPULATION HIP;  Surgeon: Maude KANDICE sheril, MD;  Location: [facility] OR;  Service: Orthopedics;  Laterality: Right;   IR FLUORO GUIDED  NEEDLE PLC ASPIRATION/INJECTION LOC  08/09/2023   JOINT REPLACEMENT     JOINT REPLACEMENT     POSTERIOR LUMBAR FUSION 4 LEVEL N/A 06/26/2022   Procedure: Lumbar One To Lumbar Five Posterior Instrumented Fusion;  Surgeon: Cheryle Debby LABOR, MD;  Location: [facility] OR;  Service: Neurosurgery;  Laterality: N/A;   REFRACTIVE SURGERY Bilateral    SHOULDER ARTHROSCOPY Right    SHOULDER OPEN ROTATOR CUFF REPAIR Right    SPINAL FUSION  1996   t10 down to my coccyx   SPINE HARDWARE REMOVAL     TOTAL ABDOMINAL HYSTERECTOMY     TOTAL HIP ARTHROPLASTY Right 05/10/2014   [place]      by dr lonni olcott    TOTAL KNEE ARTHROPLASTY Left    TOTAL SHOULDER ARTHROPLASTY Left 08/19/2016   Procedure: TOTAL SHOULDER ARTHROPLASTY;  Surgeon: Eva Herring, MD;  Location: [facility] OR;  Service: Orthopedics;  Laterality: Left;  Left total shoulder replacement   Patient Active Problem List   Diagnosis Date Noted   COPD (chronic obstructive pulmonary disease) (HCC) 04/05/2024   Colovaginal fistula 11/30/2023   Diskitis 10/04/2023   Hypotension 09/06/2023   Fracture of tenth thoracic vertebra (HCC) 09/05/2023   History of discitis 09/05/2023   Senile osteoporosis 09/05/2023   History of urinary retention 09/05/2023   Vaginal atrophy 09/05/2023   Nocturia 09/05/2023   Vulvar ulcer 09/05/2023   Pulmonary nodule 08/02/2023   Non-traumatic compression fracture of vertebral column (HCC) 06/24/2023   Nonobstructive atherosclerosis of coronary artery 02/10/2023   Bilateral pseudophakia 02/03/2023   Vertebral fracture, osteoporotic (HCC) 01/06/2023   Ventriculitis of brain due to bacteria 11/02/2022   Anemia of chronic illness 10/12/2022   Bipolar disorder, in partial remission, most recent episode depressed (HCC) 10/07/2022   Adjustment disorder with mixed anxiety and depressed mood 09/30/2022   Chronic wound 09/25/2022   Hyperglycemia 09/25/2022   Chronic midline thoracic back pain 09/17/2022   Primary insomnia 09/17/2022   Chronic combined systolic and diastolic CHF (congestive heart failure) (HCC) 09/14/2022   Acute blood loss anemia 09/09/2022   Infection of lumbar spine (HCC) 09/03/2022   Malnutrition of moderate degree 08/20/2022   Hardware complicating wound infection 08/20/2022   Infection of deep incisional surgical site after procedure 08/20/2022   Aspiration pneumonia of both lungs (HCC) 08/19/2022   Acute respiratory failure with hypoxia (HCC) 08/19/2022   Sepsis (HCC) 08/19/2022   Seizure (HCC) 08/19/2022   Wound dehiscence 08/09/2022   Delayed surgical wound healing 07/08/2022   Fracture  of lumbar spine without cord injury (HCC) 06/26/2022   Lumbar vertebral fracture (HCC) 06/25/2022   Mood disorder 09/29/2021   Generalized muscle weakness 04/30/2019   Mild cognitive impairment 09/06/2018   Hyponatremia 02/28/2018   S/P shoulder replacement, left 08/19/2016   Anxiety 10/17/2014   Acid reflux 10/17/2014   BP (high blood pressure) 10/17/2014   Arthritis, degenerative 10/17/2014   Adult hypothyroidism 10/17/2014   UTI (urinary tract infection) 06/03/2014   Fracture of bone adjacent to prosthesis 06/03/2014   Peri-prosthetic fracture of femur following total hip arthroplasty 06/02/2014   Urinary retention 05/13/2014   Chronic pain 05/10/2014   History of hip surgery 05/10/2014   Osteoarthritis 09/30/2011   UNSPECIFIED HEART FAILURE 06/24/2010   UNSPECIFIED CONGENITAL DEFECT OF SEPTAL CLOSURE 06/24/2010   Secondary cardiomyopathy (HCC) 03/18/2010   Tobacco use 03/18/2010   DM 06/13/2009   HYPERTENSION, UNSPECIFIED 06/13/2009   VENTRICULAR TACHYCARDIA 06/13/2009   VENTRICULAR SEPTAL DEFECT, CONGENITAL 06/13/2009   Essential (primary) hypertension 06/13/2009  Diabetes mellitus type 2, noninsulin dependent (HCC) 06/13/2009    ONSET DATE: 01/05/2024 MD referral  REFERRING DIAG: R26.9 (ICD-10-CM) - Gait difficulty   THERAPY DIAG:  Unsteadiness on feet  Other abnormalities of gait and mobility  Muscle weakness (generalized)  Rationale for Evaluation and Treatment: Rehabilitation  SUBJECTIVE:                                                                                                                                                                                             SUBJECTIVE STATEMENT: Pt reports feeling 4-5/10 pain today.  Was very sore after last session.   Pt accompanied by: self  PERTINENT HISTORY: Chronic osteomyelitis with worsening back pain; Hx of multiple R hip fractures with surgery to revisions, significant leg length difference with LLE  longer than RLE, hx of infection post surgery in spine (treated on antibiotics) Per MD Note 01/09/2024:    Patient was seen by Dr. Elspeth Schultze who scheduled her for surgery and on  12/01/2023 she underwent   -ROBOTIC LOW ANTERIOR RECTOSIGMOID RESECTION (LAR) WITH ANASTOMOSIS -COLOVAGINAL FISTULA TAKEDOWN & REPAIR -OMENTOPEXY OF VAGINA -INTRAOPERATIVE ASSESSMENT OF TISSUE VASCULAR PERFUSION USING ICG (indocyanine green ) IMMUNOFLUORESCENCE -TRANSVERSUS ABDOMINIS PLANE (TAP) BLOCK - BILATERAL -FLEXIBLE SIGMOIDOSCOPY    Urology did joint surgery and did    1.  Cystoscopy with bilateral ureteral FireFly injections 2.  Transurethral resection of  possible bladder tumor--large   Pathology came back with:   A. BLADDER TUMOR, TRANSURETHERAL RESECTON:  Florid cystitis cystica and papillary cystitis  Negative for carcinoma   B. RECTOSIGMOID COLON RESECTION:  Diverticula with chronic inflammation, perforation, fistula and reactive  fibrosis  Negative for carcinoma  Five benign lymph nodes (0/5)   C. FINAL DISTAL MARGIN:  Benign colonic tissue         PAIN:  Are you having pain? Yes: NPRS scale: 5/10 Pain location: R hip/back Pain description: throbbing, grinding, sharp Aggravating factors: more activities, [place] Relieving factors: rest  PRECAUTIONS: Fall and Other: Recent colectomy-pt reports no restrictions Pt has history of osteoporosis and compression fractures RED FLAGS: None   WEIGHT BEARING RESTRICTIONS: No  FALLS: Has patient fallen in last 6 months? No  LIVING ENVIRONMENT: Lives with: lives with their family Lives in: House/apartment Stairs: long ramp in/out of home Has following equipment at home: Vannie - 2 wheeled and Wheelchair (manual)  PLOF: Needs assistance with gait and Vocation/Vocational requirements: retired Runner, broadcasting/film/video, Tree surgeon; enjoys spending time with grandchildren in [place] and goes to [employer] weekly  PATIENT GOALS: To get  stronger, to get back on a cane.  To  use walker to get to the car (long ramp), get RLE into car better  OBJECTIVE:    TODAY'S TREATMENT: 03/27/2024 Activity Comments  Gait 4WW: 50 ft supervision  Gait with SPC, 38ft x 2 Gait with SPC, 60 ft Supervision-tried varied heights, using cane in R hand; cane is 31.5 in height  SAQ, 3 x 10 reps 2#, then 4# RLE  Quad sets, RLE 2 x 10 reps    Hamstring sets BLE, 2 x 10 reps, supine   Seated hamstring curls, green band 3 x 10 reps   Seated hip abduction green band, 10 reps   Sit to stand green band at thighs 5 reps    Access Code: [email or web address] URL: [email or web address] Date: 03/27/2024 Prepared by: [facility] Outpatient  Rehab - [facility]  Program Notes Seated leg slides out and in;  have a pillow case under your right foot-slide your foot out and in, 3 x 10 reps.  You can have a 3-5# ankle weight on your ankle  Exercises - Seated Hip Adduction Isometrics with Ball  - 1 x daily - 7 x weekly - 3 sets - 10 reps - Seated Hip Abduction with Resistance  - 1 x daily - 7 x weekly - 3 sets - 10 reps - Sit to Stand with Resistance Around Legs  - 1 x daily - 7 x weekly - 3 sets - 5 reps - Seated Shoulder Row with Anchored Resistance  - 1 x daily - 5 x weekly - 2 sets - 10 reps - Seated Anti-Rotation Press With Anchored Resistance  - 1 x daily - 7 x weekly - 3 sets - 10 reps - Seated Hamstring Curl with Anchored Resistance  - 1 x daily - 4 x weekly - 3 sets - 10 reps - Supine Quad Set  - 1 x daily - 5 x weekly - 3 sets - 10 reps - 3 sec hold - Short Arc Quad with Ankle Weight  - 1 x daily - 4 x weekly - 3 sets - 10 reps  PATIENT EDUCATION: Education details: Updates to HEP (consolidated/added from previous bout of therapy for current full HEP); discussed breaking the exercises up into supine/seated/postural exercises alternating days for improved consistency/compliance of HEP Person educated: Patient Education method: Explanation,  Demonstration, and Handouts Education comprehension: verbalized understanding and returned demonstration       TODAY'S TREATMENT: 04/17/2024 Aquatic therapy at [facility] - pool temperature 92 degrees   Patient seen for aquatic therapy today.  Treatment took place in water  3.6-4.8 feet deep depending upon activity.  Patient entered and exited the pool via stairs using step to pattern w/ Bil rails at SBA level.   Exercises: -Water  walking warmup 4x18 ft forward > backward > 4x18 laterally unsupported SBA, added holding multi colored barbells by side while side stepping for 2 laps.  Tolerated well -Forward marching holding single white barbell x 4 laps with cues for posture and slower movement.   -For break from sitting and to avoid increased soreness, had pt sit on bench with small step under feet for support, performing shoulder extension into water  with blue whole noodle x 10 reps with light assist from PT to avoid shoulder strain.   -Seated knee flex with controlled knee extension to surface using cuff weights x 20 on each side.  At first let her use  hands on bench but then had her hold small barbells for more core strengthening.  -At wall attempted flutter kicks, however this  was too painful for back, therefore had her kick back (with help from PT) then curl up and laterally for abdominal and oblique strengthening x 3 reps each side. -Ai Chi Accepting and Soothing x 10 reps modifying soothing for light support at wall.     Patient requires buoyancy of the water  for support for reduced fall risk with gait training and balance exercises with SBA support and verbal cuing and return demonstration. Exercises able to be performed safely in water  without the risk of fall compared to those same exercises performed on land; viscosity of water  needed for resistance for strengthening. Current of water  provides perturbations for challenging static and dynamic balance.     --------------------------------------------------- Note: Objective measures were completed at Evaluation unless otherwise noted.  DIAGNOSTIC FINDINGS: MRI 09/2023   IMPRESSION: 1. Postoperative changes of T11-S1 laminectomy and posterior spinal fusion with right posterior spinal instrumentation from L2-L5. On prior CT, there is evidence of fracture through the left posterior fusion mass at L4-5, correlating with edema is seen in the left pedicle of L4 on today's study. 2. Decreased edema within the L4 and L5 vertebral bodies and posterior elements, as well as within the L4-5 and L3-4 discs, likely reactive and related to the above fracture. 3. Loculated simple fluid within the thecal sac with some marginalized cauda equina nerve roots, most consistent with arachnoiditis. 4. No spinal canal stenosis or neural foraminal narrowing.  COGNITION: Overall cognitive status: History of cognitive impairments - at baseline   SENSATION: Light touch: Impaired  and to light touch-see below Reports some numbness L foot  POSTURE: Leg length diffierence  LOWER EXTREMITY ROM:     Active  Right Eval Left Eval  Hip flexion    Hip extension    Hip abduction    Hip adduction    Hip internal rotation    Hip external rotation    Knee flexion    Knee extension    Ankle dorsiflexion    Ankle plantarflexion    Ankle inversion    Ankle eversion     (Blank rows = not tested)  LOWER EXTREMITY MMT:    MMT Right Eval Left Eval  Hip flexion 3- 3+  Hip extension    Hip abduction 4 4  Hip adduction 4 4  Hip internal rotation    Hip external rotation    Knee flexion 4 4  Knee extension 3+ 4  Ankle dorsiflexion 3+ 3+  Ankle plantarflexion    Ankle inversion    Ankle eversion    (Blank rows = not tested)   TRANSFERS: Sit to stand: SBA  Assistive device utilized: Environmental consultant - 2 wheeled     Stand to sit: SBA  Assistive device utilized: Environmental consultant - 2 wheeled      GAIT: Findings: Gait  Characteristics: excess lateral weightshift through trunk due to significant leg length difference and step through pattern, Distance walked: 50 ft, Assistive device utilized:Walker - 2 wheeled, Level of assistance: CGA, and Comments: NA  FUNCTIONAL TESTS:  5 times sit to stand: 14.44 sec with BUE support Timed up and go (TUG): 24.53 sec 2 minute walk test: NT 10 meter walk test: 21.34 sec (1.54 ft/sec) TUG cognitive: (naming colors) 28.62 sec 9M walk back:  42.16 sec with RW Stroop gait:  13.25 sec in 10 ft (naming color words)-0.75 ft/sec   18.75 sec in 10 feet (naming printed colors)-0.53 ft/sec  PATIENT EDUCATION: Education details: See above. Person educated: Patient Education method: Explanation Education comprehension: verbalized understanding  HOME EXERCISE PROGRAM: Not yet initiated Did perform eccentric hip flexion/extension in sitting  GOALS: Goals reviewed with patient? Yes  SHORT TERM GOALS: Target date: 02/24/2024  Pt will be independent with HEP for improved strength, balance, gait. Baseline: printed out and reviewed 02/23/24 Goal status: MET 02/23/24  2.  Pt will improve TUG score to less than or equal to 18 sec for decreased fall risk. Baseline: 24.53 sec; 14.54 sec with 4WW 02/23/24 Goal status: MET 02/23/24  3.  Pt will improve 3 M Walk backwards to less than or equal to 30 seconds for decreased fall risk. Baseline: 42.16 sec; 9.96 sec 02/23/24 Goal status: MET 02/23/24  4.  Pt will report at least 50% improvement in car transfers.  Baseline:  difficulty with RLE; Patient reports that she has been able to swing her LEs into the car by herself- reports 50% improvement. 02/23/24  Goal status:  MET 02/23/24  LONG TERM GOALS: Target date: 04/19/2024  Pt will be independent with HEP for improved strength, balance, gait. Baseline:  Goal status: IN PROGRESS  2.  Pt will improve gait velocity to at  least 1.8 ft/sec for improved gait efficiency and safety. Baseline: 1.54 ft/sec Goal status: IN PROGRESS  3.  Pt will improve gait velocity with dual task to at least 1 ft/sec for improved gait and decreased fall risk. Baseline: 0.53-0.75 ft/sec with Stroop over 10 ft Goal status: IN PROGRESS  4.  Pt will improve 5x sit<>stand to less than or equal to 11.5 sec with light UE support to demonstrate improved functional strength and transfer efficiency. Baseline: 14.44 sec Goal status: IN PROGRESS  5.  Pt will verbalize plans for continued community fitness upon d/c from PT to maximize gains made in PT. Baseline:  Goal status: IN PROGRESS  6.  Pt will ambulate at least 200 ft in 2 minute walk, for improved gait efficiency and endurance.  Baseline: 100 ft in 1:30  Goal status:  IN PROGRESS    ASSESSMENT:  CLINICAL IMPRESSION: Pt tolerated session well today.  Tried more seated tasks today to avoid increased soreness and pain.  Continue to work on having pt engage core throughout seated and standing tasks.  Pt reports fatigue after session, will let PT know next session about soreness.   OBJECTIVE IMPAIRMENTS: Abnormal gait, decreased balance, decreased mobility, difficulty walking, decreased strength, impaired sensation, postural dysfunction, and pain.   ACTIVITY LIMITATIONS: sitting, standing, transfers, locomotion level, and caring for others  PARTICIPATION LIMITATIONS: driving, shopping, community activity, and travel  PERSONAL FACTORS: 3+ comorbidities: see PMH above are also affecting patient's functional outcome.   REHAB POTENTIAL: Good  CLINICAL DECISION MAKING: Evolving/moderate complexity  EVALUATION COMPLEXITY: Moderate  PLAN:  PT FREQUENCY: 2x/week  PT DURATION: 8 weeks plus eval visit  PLANNED INTERVENTIONS: [id number]- PT Re-evaluation, 97750- Physical Performance Testing, 97110-Therapeutic exercises, 97530- Therapeutic activity, [id number]- Neuromuscular re-education,  97535- Self Care, [id number]- Manual therapy, [id number]- Gait training, [phone]- Canalith repositioning, [id number]- Aquatic Therapy, Patient/Family education, and Balance training  PLAN FOR NEXT SESSION: *Pt has 3 visits in POC left, need to discuss POC next land visit.  How did trying ramp at home go? (03/27/24:  hasn't tried yet).  Ask about back pain and progress standing and gait tolerance; RLE strength for car transfers; pt requests not to perform Nustep and physioball activities d/t hip pain; pt reports that she would like to work  towards being able to walk down her ramp and into her car-work on outdoor sidewalk distance to simulate ramp  AQUATICS: Frequency: 1x/week Duration: 4 weeks  Special Instruction: chronic hip and back pain, imbalance   Aquatics:  continue core work - try ankle weights/aquatic cuffs in sitting  Damien Fought, PT, MPT [facility] [address] [place], [place], [address] Phone: [phone]   Fax:  [phone] 04/17/24, 11:05 AM

## 2024-04-18 ENCOUNTER — Telehealth: Payer: Self-pay

## 2024-04-18 NOTE — Telephone Encounter (Signed)
 Patient wants to clarify whether she should be taking cefadroxil  alone or with the doxy.  Please advise.  [name], CMA

## 2024-04-19 ENCOUNTER — Ambulatory Visit: Attending: Physical Medicine & Rehabilitation

## 2024-04-19 ENCOUNTER — Ambulatory Visit

## 2024-04-19 DIAGNOSIS — R2689 Other abnormalities of gait and mobility: Secondary | ICD-10-CM | POA: Diagnosis present

## 2024-04-19 DIAGNOSIS — R2681 Unsteadiness on feet: Secondary | ICD-10-CM | POA: Insufficient documentation

## 2024-04-19 DIAGNOSIS — M6281 Muscle weakness (generalized): Secondary | ICD-10-CM | POA: Insufficient documentation

## 2024-04-19 NOTE — Telephone Encounter (Signed)
 Patient aware to take cefadroxil  only.    [name] SHAUNNA Letters, CMA

## 2024-04-19 NOTE — Therapy (Unsigned)
 OUTPATIENT PHYSICAL THERAPY NEURO TREATMENT NOTE, Progress Note and Recertification   Patient Name: RANDI POULLARD MRN: [id number] DOB:1956/04/14, 68 y.o., female Today's Date: 04/19/2024   PCP: Rolinda Millman, MD  REFERRING PROVIDER: Urbano Albright, MD     Progress Note Reporting Period 03/13/24 to 04/19/24  See note below for Objective Data and Assessment of Progress/Goals.     END OF SESSION:  PT End of Session - 04/19/24 1403     Visit Number 18    Number of Visits 31    Date for Recertification  06/07/24    Authorization Type BCBS Medicare    Progress Note Due on Visit 28    PT Start Time 1400    PT Stop Time 1445    PT Time Calculation (min) 45 min    Equipment Utilized During Treatment Other (comment)   floatation devices as needed for safety   Activity Tolerance Patient tolerated treatment well    Behavior During Therapy WFL for tasks assessed/performed               Past Medical History:  Diagnosis Date   Anemia    Anxiety    Arthritis    Bipolar disorder (HCC)    Cardiomyopathy (HCC)    Complication of anesthesia    Awake but could not move   Depression    Diskitis 10/04/2023   GERD (gastroesophageal reflux disease)    Heart failure (HCC)    Acute   Heart murmur    related to VSD   High cholesterol    History of blood transfusion    related to OR (08/19/2016)   History of hiatal hernia    Hypertension    Hyperthyroidism    Hypotension 09/06/2023   Mild cognitive impairment 09/06/2018   Paroxysmal ventricular tachycardia (HCC)    Pneumonia    Rectovaginal fistula 09/06/2023   Tachycardia, unspecified    Type II diabetes mellitus (HCC)    UTI (urinary tract infection)    being treated with Keflex    Ventricular septal defect    Ventriculitis of brain due to bacteria 11/02/2022   Vertebral fracture, osteoporotic (HCC) 01/06/2023   Past Surgical History:  Procedure Laterality Date   ABDOMINAL HYSTERECTOMY     BACK SURGERY      CARDIAC CATHETERIZATION N/A 06/23/2015   Procedure: Left Heart Cath and Coronary Angiography;  Surgeon: Ezra GORMAN Shuck, MD;  Location: [facility] INVASIVE CV LAB;  Service: Cardiovascular;  Laterality: N/A;   CARDIAC CATHETERIZATION  1960   VSD was so small; didn't need repaired   COLECTOMY, SIGMOID, ROBOT-ASSISTED N/A 11/30/2023   Procedure: COLECTOMY, SIGMOID, ROBOT-ASSISTED ROBOTIC LOW ANTERIOR RECTOSIGMOID RESECTION (LAR) WITH ANASTOMOSIS COLOVAGINAL FISTULA TAKEDOWN & REPAIR OMENTOPEXY OF VAGINA INTRAOPERATIVE ASSESSMENT OF TISSUE VASCULAR PERFUSION USING ICG (indocyanine green ) IMMUNOFLUORESCENCE TRANSVERSUS ABDOMINIS PLANE (TAP) BLOCK - BILATERAL;  Surgeon: Sheldon Standing, MD;  Location: [facility] ORS;  Service: General;  Late   CYSTOSCOPY WITH INDOCYANINE GREEN  IMAGING (ICG) N/A 11/30/2023   Procedure: Cystoscopy with bilateral ureteral FireFly injections Transurethral resection of bladder tumor--large;  Surgeon: Carolee Sherwood JONETTA DOUGLAS, MD;  Location: [facility];  Service: Urology;  Laterality: N/A;   EXAM UNDER ANESTHESIA WITH MANIPULATION OF HIP Right 06/02/2014   dr sheril   FLEXIBLE SIGMOIDOSCOPY N/A 11/30/2023   Procedure: KINGSTON SIDE;  Surgeon: Sheldon Standing, MD;  Location: [facility];  Service: General;  Laterality: N/A;   FRACTURE SURGERY     HERNIA REPAIR     HIP CLOSED REDUCTION Right 06/02/2014  Procedure: CLOSED MANIPULATION HIP;  Surgeon: Maude KANDICE Herald, MD;  Location: [facility] OR;  Service: Orthopedics;  Laterality: Right;   IR FLUORO GUIDED NEEDLE PLC ASPIRATION/INJECTION LOC  08/09/2023   JOINT REPLACEMENT     JOINT REPLACEMENT     POSTERIOR LUMBAR FUSION 4 LEVEL N/A 06/26/2022   Procedure: Lumbar One To Lumbar Five Posterior Instrumented Fusion;  Surgeon: Cheryle Debby LABOR, MD;  Location: [facility] OR;  Service: Neurosurgery;  Laterality: N/A;   REFRACTIVE SURGERY Bilateral    SHOULDER ARTHROSCOPY Right    SHOULDER OPEN ROTATOR CUFF REPAIR Right    SPINAL FUSION  1996   t10 down to my  coccyx   SPINE HARDWARE REMOVAL     TOTAL ABDOMINAL HYSTERECTOMY     TOTAL HIP ARTHROPLASTY Right 05/10/2014   [place]      by dr lonni olcott   TOTAL KNEE ARTHROPLASTY Left    TOTAL SHOULDER ARTHROPLASTY Left 08/19/2016   Procedure: TOTAL SHOULDER ARTHROPLASTY;  Surgeon: Eva Herring, MD;  Location: [facility] OR;  Service: Orthopedics;  Laterality: Left;  Left total shoulder replacement   Patient Active Problem List   Diagnosis Date Noted   COPD (chronic obstructive pulmonary disease) (HCC) 04/05/2024   Colovaginal fistula 11/30/2023   Diskitis 10/04/2023   Hypotension 09/06/2023   Fracture of tenth thoracic vertebra (HCC) 09/05/2023   History of discitis 09/05/2023   Senile osteoporosis 09/05/2023   History of urinary retention 09/05/2023   Vaginal atrophy 09/05/2023   Nocturia 09/05/2023   Vulvar ulcer 09/05/2023   Pulmonary nodule 08/02/2023   Non-traumatic compression fracture of vertebral column (HCC) 06/24/2023   Nonobstructive atherosclerosis of coronary artery 02/10/2023   Bilateral pseudophakia 02/03/2023   Vertebral fracture, osteoporotic (HCC) 01/06/2023   Ventriculitis of brain due to bacteria 11/02/2022   Anemia of chronic illness 10/12/2022   Bipolar disorder, in partial remission, most recent episode depressed (HCC) 10/07/2022   Adjustment disorder with mixed anxiety and depressed mood 09/30/2022   Chronic wound 09/25/2022   Hyperglycemia 09/25/2022   Chronic midline thoracic back pain 09/17/2022   Primary insomnia 09/17/2022   Chronic combined systolic and diastolic CHF (congestive heart failure) (HCC) 09/14/2022   Acute blood loss anemia 09/09/2022   Infection of lumbar spine (HCC) 09/03/2022   Malnutrition of moderate degree 08/20/2022   Hardware complicating wound infection 08/20/2022   Infection of deep incisional surgical site after procedure 08/20/2022   Aspiration pneumonia of both lungs (HCC) 08/19/2022   Acute respiratory failure with hypoxia  (HCC) 08/19/2022   Sepsis (HCC) 08/19/2022   Seizure (HCC) 08/19/2022   Wound dehiscence 08/09/2022   Delayed surgical wound healing 07/08/2022   Fracture of lumbar spine without cord injury (HCC) 06/26/2022   Lumbar vertebral fracture (HCC) 06/25/2022   Mood disorder 09/29/2021   Generalized muscle weakness 04/30/2019   Mild cognitive impairment 09/06/2018   Hyponatremia 02/28/2018   S/P shoulder replacement, left 08/19/2016   Anxiety 10/17/2014   Acid reflux 10/17/2014   BP (high blood pressure) 10/17/2014   Arthritis, degenerative 10/17/2014   Adult hypothyroidism 10/17/2014   UTI (urinary tract infection) 06/03/2014   Fracture of bone adjacent to prosthesis 06/03/2014   Peri-prosthetic fracture of femur following total hip arthroplasty 06/02/2014   Urinary retention 05/13/2014   Chronic pain 05/10/2014   History of hip surgery 05/10/2014   Osteoarthritis 09/30/2011   UNSPECIFIED HEART FAILURE 06/24/2010   UNSPECIFIED CONGENITAL DEFECT OF SEPTAL CLOSURE 06/24/2010   Secondary cardiomyopathy (HCC) 03/18/2010   Tobacco use 03/18/2010  DM 06/13/2009   HYPERTENSION, UNSPECIFIED 06/13/2009   VENTRICULAR TACHYCARDIA 06/13/2009   VENTRICULAR SEPTAL DEFECT, CONGENITAL 06/13/2009   Essential (primary) hypertension 06/13/2009   Diabetes mellitus type 2, noninsulin dependent (HCC) 06/13/2009    ONSET DATE: 01/05/2024 MD referral  REFERRING DIAG: R26.9 (ICD-10-CM) - Gait difficulty   THERAPY DIAG:  Unsteadiness on feet  Other abnormalities of gait and mobility  Muscle weakness (generalized)  Rationale for Evaluation and Treatment: Rehabilitation  SUBJECTIVE:                                                                                                                                                                                             SUBJECTIVE STATEMENT: Doing well, enjoying the aquatic therapy and feeling that the balance is better Pt accompanied by:  self  PERTINENT HISTORY: Chronic osteomyelitis with worsening back pain; Hx of multiple R hip fractures with surgery to revisions, significant leg length difference with LLE longer than RLE, hx of infection post surgery in spine (treated on antibiotics) Per MD Note 01/09/2024:    Patient was seen by Dr. Elspeth Schultze who scheduled her for surgery and on  12/01/2023 she underwent   -ROBOTIC LOW ANTERIOR RECTOSIGMOID RESECTION (LAR) WITH ANASTOMOSIS -COLOVAGINAL FISTULA TAKEDOWN & REPAIR -OMENTOPEXY OF VAGINA -INTRAOPERATIVE ASSESSMENT OF TISSUE VASCULAR PERFUSION USING ICG (indocyanine green ) IMMUNOFLUORESCENCE -TRANSVERSUS ABDOMINIS PLANE (TAP) BLOCK - BILATERAL -FLEXIBLE SIGMOIDOSCOPY    Urology did joint surgery and did    1.  Cystoscopy with bilateral ureteral FireFly injections 2.  Transurethral resection of  possible bladder tumor--large   Pathology came back with:   A. BLADDER TUMOR, TRANSURETHERAL RESECTON:  Florid cystitis cystica and papillary cystitis  Negative for carcinoma   B. RECTOSIGMOID COLON RESECTION:  Diverticula with chronic inflammation, perforation, fistula and reactive  fibrosis  Negative for carcinoma  Five benign lymph nodes (0/5)   C. FINAL DISTAL MARGIN:  Benign colonic tissue         PAIN:  Are you having pain? Yes: NPRS scale: 5/10 Pain location: R hip/back Pain description: throbbing, grinding, sharp Aggravating factors: more activities, [place] Relieving factors: rest  PRECAUTIONS: Fall and Other: Recent colectomy-pt reports no restrictions Pt has history of osteoporosis and compression fractures RED FLAGS: None   WEIGHT BEARING RESTRICTIONS: No  FALLS: Has patient fallen in last 6 months? No  LIVING ENVIRONMENT: Lives with: lives with their family Lives in: House/apartment Stairs: long ramp in/out of home Has following equipment at home: Vannie - 2 wheeled and Wheelchair (manual)  PLOF: Needs assistance with  gait and Vocation/Vocational requirements: retired Runner, broadcasting/film/video, Tree surgeon; enjoys spending  time with grandchildren in [place] and [place]  PATIENT GOALS: To get stronger, to get back on a cane.  To use walker to get to the car (long ramp), get RLE into car better  OBJECTIVE:   TODAY'S TREATMENT: 04/19/24 Activity Comments  POC details performance and review   Gait training Trials w/ RW and 4WW for lifting/lowering curb and increased heights for mobility in/out of home requiring CGA-min A due to gen weakness and balance issues                  TODAY'S TREATMENT: 03/27/2024 Activity Comments  Gait 4WW: 50 ft supervision  Gait with SPC, 28ft x 2 Gait with SPC, 60 ft Supervision-tried varied heights, using cane in R hand; cane is 31.5 in height  SAQ, 3 x 10 reps 2#, then 4# RLE  Quad sets, RLE 2 x 10 reps    Hamstring sets BLE, 2 x 10 reps, supine   Seated hamstring curls, green band 3 x 10 reps   Seated hip abduction green band, 10 reps   Sit to stand green band at thighs 5 reps    Access Code: [email or web address] URL: [email or web address] Date: 03/27/2024 Prepared by: [facility] - Outpatient  Rehab - [facility]  Program Notes Seated leg slides out and in;  have a pillow case under your right foot-slide your foot out and in, 3 x 10 reps.  You can have a 3-5# ankle weight on your ankle  Exercises - Seated Hip Adduction Isometrics with Ball  - 1 x daily - 7 x weekly - 3 sets - 10 reps - Seated Hip Abduction with Resistance  - 1 x daily - 7 x weekly - 3 sets - 10 reps - Sit to Stand with Resistance Around Legs  - 1 x daily - 7 x weekly - 3 sets - 5 reps - Seated Shoulder Row with Anchored Resistance  - 1 x daily - 5 x weekly - 2 sets - 10 reps - Seated Anti-Rotation Press With Anchored Resistance  - 1 x daily - 7 x weekly - 3 sets - 10 reps - Seated Hamstring Curl with Anchored Resistance  - 1 x daily - 4 x weekly - 3 sets - 10 reps - Supine Quad Set  - 1 x  daily - 5 x weekly - 3 sets - 10 reps - 3 sec hold - Short Arc Quad with Ankle Weight  - 1 x daily - 4 x weekly - 3 sets - 10 reps  PATIENT EDUCATION: Education details: Updates to HEP (consolidated/added from previous bout of therapy for current full HEP); discussed breaking the exercises up into supine/seated/postural exercises alternating days for improved consistency/compliance of HEP Person educated: Patient Education method: Explanation, Demonstration, and Handouts Education comprehension: verbalized understanding and returned demonstration       TODAY'S TREATMENT: 04/17/2024 Aquatic therapy at [facility] - pool temperature 92 degrees   Patient seen for aquatic therapy today.  Treatment took place in water  3.6-4.8 feet deep depending upon activity.  Patient entered and exited the pool via stairs using step to pattern w/ Bil rails at SBA level.   Exercises: -Water  walking warmup 4x18 ft forward > backward > 4x18 laterally unsupported SBA, added holding multi colored barbells by side while side stepping for 2 laps.  Tolerated well -Forward marching holding single white barbell x 4 laps with cues for posture and slower movement.   -For break from sitting and to avoid increased  soreness, had pt sit on bench with small step under feet for support, performing shoulder extension into water  with blue whole noodle x 10 reps with light assist from PT to avoid shoulder strain.   -Seated knee flex with controlled knee extension to surface using cuff weights x 20 on each side.  At first let her use  hands on bench but then had her hold small barbells for more core strengthening.  -At wall attempted flutter kicks, however this was too painful for back, therefore had her kick back (with help from PT) then curl up and laterally for abdominal and oblique strengthening x 3 reps each side. -Ai Chi Accepting and Soothing x 10 reps modifying soothing for light support at wall.     Patient requires  buoyancy of the water  for support for reduced fall risk with gait training and balance exercises with SBA support and verbal cuing and return demonstration. Exercises able to be performed safely in water  without the risk of fall compared to those same exercises performed on land; viscosity of water  needed for resistance for strengthening. Current of water  provides perturbations for challenging static and dynamic balance.    --------------------------------------------------- Note: Objective measures were completed at Evaluation unless otherwise noted.  DIAGNOSTIC FINDINGS: MRI 09/2023   IMPRESSION: 1. Postoperative changes of T11-S1 laminectomy and posterior spinal fusion with right posterior spinal instrumentation from L2-L5. On prior CT, there is evidence of fracture through the left posterior fusion mass at L4-5, correlating with edema is seen in the left pedicle of L4 on today's study. 2. Decreased edema within the L4 and L5 vertebral bodies and posterior elements, as well as within the L4-5 and L3-4 discs, likely reactive and related to the above fracture. 3. Loculated simple fluid within the thecal sac with some marginalized cauda equina nerve roots, most consistent with arachnoiditis. 4. No spinal canal stenosis or neural foraminal narrowing.  COGNITION: Overall cognitive status: History of cognitive impairments - at baseline   SENSATION: Light touch: Impaired  and to light touch-see below Reports some numbness L foot  POSTURE: Leg length diffierence  LOWER EXTREMITY ROM:     Active  Right Eval Left Eval  Hip flexion    Hip extension    Hip abduction    Hip adduction    Hip internal rotation    Hip external rotation    Knee flexion    Knee extension    Ankle dorsiflexion    Ankle plantarflexion    Ankle inversion    Ankle eversion     (Blank rows = not tested)  LOWER EXTREMITY MMT:    MMT Right Eval Left Eval  Hip flexion 3- 3+  Hip extension    Hip  abduction 4 4  Hip adduction 4 4  Hip internal rotation    Hip external rotation    Knee flexion 4 4  Knee extension 3+ 4  Ankle dorsiflexion 3+ 3+  Ankle plantarflexion    Ankle inversion    Ankle eversion    (Blank rows = not tested)   TRANSFERS: Sit to stand: SBA  Assistive device utilized: Environmental consultant - 2 wheeled     Stand to sit: SBA  Assistive device utilized: Environmental consultant - 2 wheeled      GAIT: Findings: Gait Characteristics: excess lateral weightshift through trunk due to significant leg length difference and step through pattern, Distance walked: 50 ft, Assistive device utilized:Walker - 2 wheeled, Level of assistance: CGA, and Comments: NA  FUNCTIONAL TESTS:  5  times sit to stand: 14.44 sec with BUE support Timed up and go (TUG): 24.53 sec 2 minute walk test: NT 10 meter walk test: 21.34 sec (1.54 ft/sec) TUG cognitive: (naming colors) 28.62 sec 26M walk back:  42.16 sec with RW Stroop gait:  13.25 sec in 10 ft (naming color words)-0.75 ft/sec   18.75 sec in 10 feet (naming printed colors)-0.53 ft/sec                                                                          PATIENT EDUCATION: Education details: See above. Person educated: Patient Education method: Explanation Education comprehension: verbalized understanding  HOME EXERCISE PROGRAM: Not yet initiated Did perform eccentric hip flexion/extension in sitting  GOALS: Goals reviewed with patient? Yes  SHORT TERM GOALS: Target date: 02/24/2024  Pt will be independent with HEP for improved strength, balance, gait. Baseline: printed out and reviewed 02/23/24 Goal status: MET 02/23/24  2.  Pt will improve TUG score to less than or equal to 18 sec for decreased fall risk. Baseline: 24.53 sec; 14.54 sec with 4WW 02/23/24 Goal status: MET 02/23/24  3.  Pt will improve 3 M Walk backwards to less than or equal to 30 seconds for decreased fall risk. Baseline: 42.16 sec; 9.96 sec 02/23/24 Goal status: MET 02/23/24  4.  Pt  will report at least 50% improvement in car transfers.  Baseline:  difficulty with RLE; Patient reports that she has been able to swing her LEs into the car by herself- reports 50% improvement. 02/23/24  Goal status:  MET 02/23/24  LONG TERM GOALS: Target date: 06/07/2024    Pt will be independent with HEP for improved strength, balance, gait. Baseline:  Goal status: IN PROGRESS  2.  Pt will improve gait velocity to at least 1.8 ft/sec for improved gait efficiency and safety. Baseline: 1.54 ft/sec; 1.8 ft/sec w/ 5TT Goal status: MET  3.  Pt will improve gait velocity with dual task to at least 1 ft/sec for improved gait and decreased fall risk. Baseline: 0.53-0.75 ft/sec with Stroop over 10 ft Goal status: IN PROGRESS  4.  Pt will improve 5x sit<>stand to less than or equal to 11.5 sec with light UE support to demonstrate improved functional strength and transfer efficiency. Baseline: 14.44 sec; 13.9 sec Goal status: IN PROGRESS 04/19/24  5.  Pt will verbalize plans for continued community fitness upon d/c from PT to maximize gains made in PT. Baseline:  Goal status: IN PROGRESS  6.  Pt will ambulate at least 200 ft in 2 minute walk, for improved gait efficiency and endurance.  Baseline: 100 ft in 1:30; 215 ft w/ 4WW x 2 min  Goal status:  MET   7.  Demo/report ability to negotiate ramp at her home w/ RW and load/unload AD into care to enable ability to enter/leave home  Baseline: unable  Goal status: INITIAL  8.  Demo ability to ambulate up to 300 m (984 ft) for community-level ambulation w/ AD  Baseline: 215 ft w/ 5TT  Goal status: INITIAL  ASSESSMENT:  CLINICAL IMPRESSION: Pt reports overall functional improvement since start of care noting improved balance and some progress to endurance.  Demo ability to ambulate with faster pace and  greater endurance as evidenced by distance during and 10 meter walk test speed.  Pt reports ongoing difficulty with negotiating ramp at  her home and difficulty in lifting AD which prohibits her from entering/leaving her home independently.  She would benefit from continued sessions to progress her ambulation capabilities and strength to afford improved functional mobility to enable independence at a community level. Progress has been slower than anticipated due to degree of deficits and interaction of conditions.    OBJECTIVE IMPAIRMENTS: Abnormal gait, decreased balance, decreased mobility, difficulty walking, decreased strength, impaired sensation, postural dysfunction, and pain.   ACTIVITY LIMITATIONS: sitting, standing, transfers, locomotion level, and caring for others  PARTICIPATION LIMITATIONS: driving, shopping, community activity, and travel  PERSONAL FACTORS: 3+ comorbidities: see PMH above are also affecting patient's functional outcome.   REHAB POTENTIAL: Good  CLINICAL DECISION MAKING: Evolving/moderate complexity  EVALUATION COMPLEXITY: Moderate  PLAN:  PT FREQUENCY: 2x/week  PT DURATION: 6 weeks   PLANNED INTERVENTIONS: [id number]- PT Re-evaluation, 97750- Physical Performance Testing, 97110-Therapeutic exercises, 97530- Therapeutic activity, [id number]- Neuromuscular re-education, 97535- Self Care, [id number]- Manual therapy, [id number]- Gait training, [id number]- Canalith repositioning, [id number]- Aquatic Therapy, Patient/Family education, and Balance training  PLAN FOR NEXT SESSION: .  Ask about back pain and progress standing and gait tolerance; RLE strength for car transfers; pt requests not to perform Nustep and physioball activities d/t hip pain; pt reports that she would like to work towards being able to walk down her ramp and into her car-work on outdoor sidewalk distance to simulate ramp and lifting/loading AD in/out of car  AQUATICS: Frequency: 1-2x/week Duration: 6 weeks  Special Instruction: chronic hip and back pain, imbalance   Aquatics:  continue core work - try ankle weights/aquatic cuffs in sitting  7:55 AM,  04/20/24 M. Kelly Kathrynne Kulinski, PT, DPT Physical Therapist- [place] Office Number: [phone]

## 2024-04-27 ENCOUNTER — Other Ambulatory Visit: Payer: Self-pay | Admitting: Family Medicine

## 2024-04-27 DIAGNOSIS — Z1231 Encounter for screening mammogram for malignant neoplasm of breast: Secondary | ICD-10-CM

## 2024-05-01 ENCOUNTER — Ambulatory Visit: Attending: Physical Medicine & Rehabilitation | Admitting: Rehabilitation

## 2024-05-01 ENCOUNTER — Encounter: Payer: Self-pay | Admitting: Rehabilitation

## 2024-05-01 DIAGNOSIS — R2689 Other abnormalities of gait and mobility: Secondary | ICD-10-CM | POA: Insufficient documentation

## 2024-05-01 DIAGNOSIS — R2681 Unsteadiness on feet: Secondary | ICD-10-CM | POA: Insufficient documentation

## 2024-05-01 DIAGNOSIS — M6281 Muscle weakness (generalized): Secondary | ICD-10-CM | POA: Diagnosis present

## 2024-05-01 NOTE — Therapy (Signed)
 OUTPATIENT PHYSICAL THERAPY NEURO TREATMENT NOTE   Patient Name: [name] MRN: [id number] DOB:29-Sep-1955, 68 y.o., female Today's Date: 05/01/2024   PCP: Rolinda Millman, MD  REFERRING PROVIDER: Urbano Albright, MD        END OF SESSION:  PT End of Session - 05/01/24 0756     Visit Number 19    Number of Visits 31    Date for Recertification  06/07/24    Authorization Type BCBS Medicare    Progress Note Due on Visit 28    PT Start Time 1102    PT Stop Time 1150    PT Time Calculation (min) 48 min    Equipment Utilized During Treatment Other (comment)   floatation devices as needed for safety   Activity Tolerance Patient tolerated treatment well    Behavior During Therapy WFL for tasks assessed/performed               Past Medical History:  Diagnosis Date   Anemia    Anxiety    Arthritis    Bipolar disorder (HCC)    Cardiomyopathy (HCC)    Complication of anesthesia    Awake but could not move   Depression    Diskitis 10/04/2023   GERD (gastroesophageal reflux disease)    Heart failure (HCC)    Acute   Heart murmur    related to VSD   High cholesterol    History of blood transfusion    related to OR (08/19/2016)   History of hiatal hernia    Hypertension    Hyperthyroidism    Hypotension 09/06/2023   Mild cognitive impairment 09/06/2018   Paroxysmal ventricular tachycardia (HCC)    Pneumonia    Rectovaginal fistula 09/06/2023   Tachycardia, unspecified    Type II diabetes mellitus (HCC)    UTI (urinary tract infection)    being treated with Keflex    Ventricular septal defect    Ventriculitis of brain due to bacteria 11/02/2022   Vertebral fracture, osteoporotic (HCC) 01/06/2023   Past Surgical History:  Procedure Laterality Date   ABDOMINAL HYSTERECTOMY     BACK SURGERY     CARDIAC CATHETERIZATION N/A 06/23/2015   Procedure: Left Heart Cath and Coronary Angiography;  Surgeon: Ezra GORMAN Shuck, MD;  Location: [facility] INVASIVE CV  LAB;  Service: Cardiovascular;  Laterality: N/A;   CARDIAC CATHETERIZATION  1960   VSD was so small; didn't need repaired   COLECTOMY, SIGMOID, ROBOT-ASSISTED N/A 11/30/2023   Procedure: COLECTOMY, SIGMOID, ROBOT-ASSISTED ROBOTIC LOW ANTERIOR RECTOSIGMOID RESECTION (LAR) WITH ANASTOMOSIS COLOVAGINAL FISTULA TAKEDOWN & REPAIR OMENTOPEXY OF VAGINA INTRAOPERATIVE ASSESSMENT OF TISSUE VASCULAR PERFUSION USING ICG (indocyanine green ) IMMUNOFLUORESCENCE TRANSVERSUS ABDOMINIS PLANE (TAP) BLOCK - BILATERAL;  Surgeon: Sheldon Standing, MD;  Location: [facility] ORS;  Service: General;  Late   CYSTOSCOPY WITH INDOCYANINE GREEN  IMAGING (ICG) N/A 11/30/2023   Procedure: Cystoscopy with bilateral ureteral FireFly injections Transurethral resection of bladder tumor--large;  Surgeon: Carolee Sherwood JONETTA DOUGLAS, MD;  Location: [facility];  Service: Urology;  Laterality: N/A;   EXAM UNDER ANESTHESIA WITH MANIPULATION OF HIP Right 06/02/2014   dr sheril   FLEXIBLE SIGMOIDOSCOPY N/A 11/30/2023   Procedure: KINGSTON SIDE;  Surgeon: Sheldon Standing, MD;  Location: [facility];  Service: General;  Laterality: N/A;   FRACTURE SURGERY     HERNIA REPAIR     HIP CLOSED REDUCTION Right 06/02/2014   Procedure: CLOSED MANIPULATION HIP;  Surgeon: Maude KANDICE sheril, MD;  Location: [facility] OR;  Service: Orthopedics;  Laterality: Right;  IR FLUORO GUIDED NEEDLE PLC ASPIRATION/INJECTION LOC  08/09/2023   JOINT REPLACEMENT     JOINT REPLACEMENT     POSTERIOR LUMBAR FUSION 4 LEVEL N/A 06/26/2022   Procedure: Lumbar One To Lumbar Five Posterior Instrumented Fusion;  Surgeon: Cheryle Debby LABOR, MD;  Location: [facility] OR;  Service: Neurosurgery;  Laterality: N/A;   REFRACTIVE SURGERY Bilateral    SHOULDER ARTHROSCOPY Right    SHOULDER OPEN ROTATOR CUFF REPAIR Right    SPINAL FUSION  1996   t10 down to my coccyx   SPINE HARDWARE REMOVAL     TOTAL ABDOMINAL HYSTERECTOMY     TOTAL HIP ARTHROPLASTY Right 05/10/2014   [place]      by dr lonni  olcott   TOTAL KNEE ARTHROPLASTY Left    TOTAL SHOULDER ARTHROPLASTY Left 08/19/2016   Procedure: TOTAL SHOULDER ARTHROPLASTY;  Surgeon: Eva Herring, MD;  Location: [facility] OR;  Service: Orthopedics;  Laterality: Left;  Left total shoulder replacement   Patient Active Problem List   Diagnosis Date Noted   COPD (chronic obstructive pulmonary disease) (HCC) 04/05/2024   Colovaginal fistula 11/30/2023   Diskitis 10/04/2023   Hypotension 09/06/2023   Fracture of tenth thoracic vertebra (HCC) 09/05/2023   History of discitis 09/05/2023   Senile osteoporosis 09/05/2023   History of urinary retention 09/05/2023   Vaginal atrophy 09/05/2023   Nocturia 09/05/2023   Vulvar ulcer 09/05/2023   Pulmonary nodule 08/02/2023   Non-traumatic compression fracture of vertebral column (HCC) 06/24/2023   Nonobstructive atherosclerosis of coronary artery 02/10/2023   Bilateral pseudophakia 02/03/2023   Vertebral fracture, osteoporotic (HCC) 01/06/2023   Ventriculitis of brain due to bacteria 11/02/2022   Anemia of chronic illness 10/12/2022   Bipolar disorder, in partial remission, most recent episode depressed (HCC) 10/07/2022   Adjustment disorder with mixed anxiety and depressed mood 09/30/2022   Chronic wound 09/25/2022   Hyperglycemia 09/25/2022   Chronic midline thoracic back pain 09/17/2022   Primary insomnia 09/17/2022   Chronic combined systolic and diastolic CHF (congestive heart failure) (HCC) 09/14/2022   Acute blood loss anemia 09/09/2022   Infection of lumbar spine (HCC) 09/03/2022   Malnutrition of moderate degree 08/20/2022   Hardware complicating wound infection 08/20/2022   Infection of deep incisional surgical site after procedure 08/20/2022   Aspiration pneumonia of both lungs (HCC) 08/19/2022   Acute respiratory failure with hypoxia (HCC) 08/19/2022   Sepsis (HCC) 08/19/2022   Seizure (HCC) 08/19/2022   Wound dehiscence 08/09/2022   Delayed surgical wound healing 07/08/2022    Fracture of lumbar spine without cord injury (HCC) 06/26/2022   Lumbar vertebral fracture (HCC) 06/25/2022   Mood disorder 09/29/2021   Generalized muscle weakness 04/30/2019   Mild cognitive impairment 09/06/2018   Hyponatremia 02/28/2018   S/P shoulder replacement, left 08/19/2016   Anxiety 10/17/2014   Acid reflux 10/17/2014   BP (high blood pressure) 10/17/2014   Arthritis, degenerative 10/17/2014   Adult hypothyroidism 10/17/2014   UTI (urinary tract infection) 06/03/2014   Fracture of bone adjacent to prosthesis 06/03/2014   Peri-prosthetic fracture of femur following total hip arthroplasty 06/02/2014   Urinary retention 05/13/2014   Chronic pain 05/10/2014   History of hip surgery 05/10/2014   Osteoarthritis 09/30/2011   UNSPECIFIED HEART FAILURE 06/24/2010   UNSPECIFIED CONGENITAL DEFECT OF SEPTAL CLOSURE 06/24/2010   Secondary cardiomyopathy (HCC) 03/18/2010   Tobacco use 03/18/2010   DM 06/13/2009   HYPERTENSION, UNSPECIFIED 06/13/2009   VENTRICULAR TACHYCARDIA 06/13/2009   VENTRICULAR SEPTAL DEFECT, CONGENITAL 06/13/2009   Essential (  primary) hypertension 06/13/2009   Diabetes mellitus type 2, noninsulin dependent (HCC) 06/13/2009    ONSET DATE: 01/05/2024 MD referral  REFERRING DIAG: R26.9 (ICD-10-CM) - Gait difficulty   THERAPY DIAG:  Unsteadiness on feet  Other abnormalities of gait and mobility  Muscle weakness (generalized)  Rationale for Evaluation and Treatment: Rehabilitation  SUBJECTIVE:                                                                                                                                                                                             SUBJECTIVE STATEMENT: Doing well, reports that antibiotic is giving her upset stomach yesterday and this morning.  Pt accompanied by: self  PERTINENT HISTORY: Chronic osteomyelitis with worsening back pain; Hx of multiple R hip fractures with surgery to revisions, significant leg  length difference with LLE longer than RLE, hx of infection post surgery in spine (treated on antibiotics) Per MD Note 01/09/2024:    Patient was seen by Dr. Elspeth Schultze who scheduled her for surgery and on  12/01/2023 she underwent   -ROBOTIC LOW ANTERIOR RECTOSIGMOID RESECTION (LAR) WITH ANASTOMOSIS -COLOVAGINAL FISTULA TAKEDOWN & REPAIR -OMENTOPEXY OF VAGINA -INTRAOPERATIVE ASSESSMENT OF TISSUE VASCULAR PERFUSION USING ICG (indocyanine green ) IMMUNOFLUORESCENCE -TRANSVERSUS ABDOMINIS PLANE (TAP) BLOCK - BILATERAL -FLEXIBLE SIGMOIDOSCOPY    Urology did joint surgery and did    1.  Cystoscopy with bilateral ureteral FireFly injections 2.  Transurethral resection of  possible bladder tumor--large   Pathology came back with:   A. BLADDER TUMOR, TRANSURETHERAL RESECTON:  Florid cystitis cystica and papillary cystitis  Negative for carcinoma   B. RECTOSIGMOID COLON RESECTION:  Diverticula with chronic inflammation, perforation, fistula and reactive  fibrosis  Negative for carcinoma  Five benign lymph nodes (0/5)   C. FINAL DISTAL MARGIN:  Benign colonic tissue         PAIN:  Are you having pain? Yes: NPRS scale: 5/10 Pain location: R hip/back Pain description: throbbing, grinding, sharp Aggravating factors: more activities, [place] Relieving factors: rest  PRECAUTIONS: Fall and Other: Recent colectomy-pt reports no restrictions Pt has history of osteoporosis and compression fractures RED FLAGS: None   WEIGHT BEARING RESTRICTIONS: No  FALLS: Has patient fallen in last 6 months? No  LIVING ENVIRONMENT: Lives with: lives with their family Lives in: House/apartment Stairs: long ramp in/out of home Has following equipment at home: Vannie - 2 wheeled and Wheelchair (manual)  PLOF: Needs assistance with gait and Vocation/Vocational requirements: retired Runner, broadcasting/film/video, Tree surgeon; enjoys spending time with grandchildren in [place] and goes to [employer]  weekly  PATIENT GOALS: To get stronger, to get back  on a cane.  To use walker to get to the car (long ramp), get RLE into car better  OBJECTIVE:   TODAY'S TREATMENT: 05/01/24  Aquatic therapy at [facility] - pool temperature 92 degrees   Patient seen for aquatic therapy today.  Treatment took place in water  3.6-4.8 feet deep depending upon activity.  Patient entered and exited the pool via stairs using step to pattern w/ Bil rails at SBA level.   Exercises: -Water  walking warmup 4x18 ft forward > backward > 4x18 laterally unsupported SBA, added holding multi colored barbells by side while side stepping for 4 laps.  Tolerated well -Forward marching holding single white barbell x 4 laps with cues for posture and slower movement.   -For break from standing and to avoid increased soreness, had pt sit on bench with small step under feet for support, performing alt LAQ x 10 reps with 3.5 lb ankle weights  -Seated straight leg hip abd/add x 15 reps with ankle weights with light UE support on wall.  -Seated straight leg flutter kicks x 20 reps (2 sets) with ankle weights with light UE support to further engage core mm.   -Seated hamstring stretch shifting body to one side with one leg up on step to stretch while other leg remained on small step x 1 min each side.  Attempted groin stretch here but ineffective therefore had her stand and use noodle with back on wall for standing groin stretch x 30 secs each side.  Tolerated well.  -Ended with forward walking x 2 more laps with barbells for support.       Patient requires buoyancy of the water  for support for reduced fall risk with gait training and balance exercises with SBA support and verbal cuing and return demonstration. Exercises able to be performed safely in water  without the risk of fall compared to those same exercises performed on land; viscosity of water  needed for resistance for strengthening. Current of water  provides perturbations for  challenging static and dynamic balance.        Access Code: [email or web address] URL: [email or web address] Date: 03/27/2024 Prepared by: [facility] - Outpatient  Rehab - [facility]  Program Notes Seated leg slides out and in;  have a pillow case under your right foot-slide your foot out and in, 3 x 10 reps.  You can have a 3-5# ankle weight on your ankle  Exercises - Seated Hip Adduction Isometrics with Ball  - 1 x daily - 7 x weekly - 3 sets - 10 reps - Seated Hip Abduction with Resistance  - 1 x daily - 7 x weekly - 3 sets - 10 reps - Sit to Stand with Resistance Around Legs  - 1 x daily - 7 x weekly - 3 sets - 5 reps - Seated Shoulder Row with Anchored Resistance  - 1 x daily - 5 x weekly - 2 sets - 10 reps - Seated Anti-Rotation Press With Anchored Resistance  - 1 x daily - 7 x weekly - 3 sets - 10 reps - Seated Hamstring Curl with Anchored Resistance  - 1 x daily - 4 x weekly - 3 sets - 10 reps - Supine Quad Set  - 1 x daily - 5 x weekly - 3 sets - 10 reps - 3 sec hold - Short Arc Quad with Ankle Weight  - 1 x daily - 4 x weekly - 3 sets - 10 reps  PATIENT EDUCATION: Education details: Updates to HEP (consolidated/added from previous bout of  therapy for current full HEP); discussed breaking the exercises up into supine/seated/postural exercises alternating days for improved consistency/compliance of HEP Person educated: Patient Education method: Explanation, Demonstration, and Handouts Education comprehension: verbalized understanding and returned demonstration        --------------------------------------------------- Note: Objective measures were completed at Evaluation unless otherwise noted.  DIAGNOSTIC FINDINGS: MRI 09/2023   IMPRESSION: 1. Postoperative changes of T11-S1 laminectomy and posterior spinal fusion with right posterior spinal instrumentation from L2-L5. On prior CT, there is evidence of fracture through the left posterior fusion mass at L4-5,  correlating with edema is seen in the left pedicle of L4 on today's study. 2. Decreased edema within the L4 and L5 vertebral bodies and posterior elements, as well as within the L4-5 and L3-4 discs, likely reactive and related to the above fracture. 3. Loculated simple fluid within the thecal sac with some marginalized cauda equina nerve roots, most consistent with arachnoiditis. 4. No spinal canal stenosis or neural foraminal narrowing.  COGNITION: Overall cognitive status: History of cognitive impairments - at baseline   SENSATION: Light touch: Impaired  and to light touch-see below Reports some numbness L foot  POSTURE: Leg length diffierence  LOWER EXTREMITY ROM:     Active  Right Eval Left Eval  Hip flexion    Hip extension    Hip abduction    Hip adduction    Hip internal rotation    Hip external rotation    Knee flexion    Knee extension    Ankle dorsiflexion    Ankle plantarflexion    Ankle inversion    Ankle eversion     (Blank rows = not tested)  LOWER EXTREMITY MMT:    MMT Right Eval Left Eval  Hip flexion 3- 3+  Hip extension    Hip abduction 4 4  Hip adduction 4 4  Hip internal rotation    Hip external rotation    Knee flexion 4 4  Knee extension 3+ 4  Ankle dorsiflexion 3+ 3+  Ankle plantarflexion    Ankle inversion    Ankle eversion    (Blank rows = not tested)   TRANSFERS: Sit to stand: SBA  Assistive device utilized: Environmental consultant - 2 wheeled     Stand to sit: SBA  Assistive device utilized: Environmental consultant - 2 wheeled      GAIT: Findings: Gait Characteristics: excess lateral weightshift through trunk due to significant leg length difference and step through pattern, Distance walked: 50 ft, Assistive device utilized:Walker - 2 wheeled, Level of assistance: CGA, and Comments: NA  FUNCTIONAL TESTS:  5 times sit to stand: 14.44 sec with BUE support Timed up and go (TUG): 24.53 sec 2 minute walk test: NT 10 meter walk test: 21.34 sec (1.54  ft/sec) TUG cognitive: (naming colors) 28.62 sec 39M walk back:  42.16 sec with RW Stroop gait:  13.25 sec in 10 ft (naming color words)-0.75 ft/sec   18.75 sec in 10 feet (naming printed colors)-0.53 ft/sec                                                                          PATIENT EDUCATION: Education details: See above. Person educated: Patient Education method: Explanation Education comprehension: verbalized understanding  HOME  EXERCISE PROGRAM: Not yet initiated Did perform eccentric hip flexion/extension in sitting  GOALS: Goals reviewed with patient? Yes  SHORT TERM GOALS: Target date: 02/24/2024  Pt will be independent with HEP for improved strength, balance, gait. Baseline: printed out and reviewed 02/23/24 Goal status: MET 02/23/24  2.  Pt will improve TUG score to less than or equal to 18 sec for decreased fall risk. Baseline: 24.53 sec; 14.54 sec with 4WW 02/23/24 Goal status: MET 02/23/24  3.  Pt will improve 3 M Walk backwards to less than or equal to 30 seconds for decreased fall risk. Baseline: 42.16 sec; 9.96 sec 02/23/24 Goal status: MET 02/23/24  4.  Pt will report at least 50% improvement in car transfers.  Baseline:  difficulty with RLE; Patient reports that she has been able to swing her LEs into the car by herself- reports 50% improvement. 02/23/24  Goal status:  MET 02/23/24  LONG TERM GOALS: Target date: 06/07/2024    Pt will be independent with HEP for improved strength, balance, gait. Baseline:  Goal status: IN PROGRESS  2.  Pt will improve gait velocity to at least 1.8 ft/sec for improved gait efficiency and safety. Baseline: 1.54 ft/sec; 1.8 ft/sec w/ 5TT Goal status: MET  3.  Pt will improve gait velocity with dual task to at least 1 ft/sec for improved gait and decreased fall risk. Baseline: 0.53-0.75 ft/sec with Stroop over 10 ft Goal status: IN PROGRESS  4.  Pt will improve 5x sit<>stand to less than or equal to 11.5 sec with light UE support  to demonstrate improved functional strength and transfer efficiency. Baseline: 14.44 sec; 13.9 sec Goal status: IN PROGRESS 04/19/24  5.  Pt will verbalize plans for continued community fitness upon d/c from PT to maximize gains made in PT. Baseline:  Goal status: IN PROGRESS  6.  Pt will ambulate at least 200 ft in 2 minute walk, for improved gait efficiency and endurance.  Baseline: 100 ft in 1:30; 215 ft w/ 4WW x 2 min  Goal status:  MET   7.  Demo/report ability to negotiate ramp at her home w/ RW and load/unload AD into care to enable ability to enter/leave home  Baseline: unable  Goal status: INITIAL  8.  Demo ability to ambulate up to 300 m (984 ft) for community-level ambulation w/ AD  Baseline: 215 ft w/ 5TT  Goal status: INITIAL  ASSESSMENT:  CLINICAL IMPRESSION: Continue to focus on breaking session up with standing and seated tasks to reduce pain and soreness.  PT did add 3.5 lb ankle weights to seated tasks today which she tolerated well but did report some adductor soreness at end of exercises.    OBJECTIVE IMPAIRMENTS: Abnormal gait, decreased balance, decreased mobility, difficulty walking, decreased strength, impaired sensation, postural dysfunction, and pain.   ACTIVITY LIMITATIONS: sitting, standing, transfers, locomotion level, and caring for others  PARTICIPATION LIMITATIONS: driving, shopping, community activity, and travel  PERSONAL FACTORS: 3+ comorbidities: see PMH above are also affecting patient's functional outcome.   REHAB POTENTIAL: Good  CLINICAL DECISION MAKING: Evolving/moderate complexity  EVALUATION COMPLEXITY: Moderate  PLAN:  PT FREQUENCY: 2x/week  PT DURATION: 6 weeks   PLANNED INTERVENTIONS: [id number]- PT Re-evaluation, 97750- Physical Performance Testing, 97110-Therapeutic exercises, 97530- Therapeutic activity, [id number]- Neuromuscular re-education, 97535- Self Care, [id number]- Manual therapy, [id number]- Gait training, [phone]- Canalith  repositioning, [id number]- Aquatic Therapy, Patient/Family education, and Balance training  PLAN FOR NEXT SESSION: .  Ask about back pain and progress standing and gait tolerance;  RLE strength for car transfers; pt requests not to perform Nustep and physioball activities d/t hip pain; pt reports that she would like to work towards being able to walk down her ramp and into her car-work on outdoor sidewalk distance to simulate ramp and lifting/loading AD in/out of car  AQUATICS: Frequency: 1-2x/week Duration: 6 weeks  Special Instruction: chronic hip and back pain, imbalance   Aquatics:  continue core work - try ankle weights/aquatic cuffs in sitting  Damien Fought, PT, MPT [facility] [address] [place], [place], [address] Phone: [phone]   Fax:  [phone] 05/01/24, 12:06 PM

## 2024-05-08 ENCOUNTER — Encounter: Payer: Self-pay | Admitting: Rehabilitation

## 2024-05-08 ENCOUNTER — Ambulatory Visit: Admitting: Rehabilitation

## 2024-05-08 DIAGNOSIS — R2689 Other abnormalities of gait and mobility: Secondary | ICD-10-CM

## 2024-05-08 DIAGNOSIS — R2681 Unsteadiness on feet: Secondary | ICD-10-CM

## 2024-05-08 DIAGNOSIS — M6281 Muscle weakness (generalized): Secondary | ICD-10-CM

## 2024-05-08 NOTE — Therapy (Signed)
 OUTPATIENT PHYSICAL THERAPY NEURO TREATMENT NOTE   Patient Name: [name] MRN: [id number] DOB:[birth year], 68 y.o., female Today's Date: 05/08/2024   PCP: Rolinda Millman, MD  REFERRING PROVIDER: Urbano Albright, MD        END OF SESSION:  PT End of Session - 05/08/24 0950     Visit Number 20    Number of Visits 31    Date for Recertification  06/07/24    Authorization Type BCBS Medicare    Progress Note Due on Visit 28    PT Start Time 1101    PT Stop Time 1145    PT Time Calculation (min) 44 min    Equipment Utilized During Treatment Other (comment)   floatation devices as needed for safety   Activity Tolerance Patient tolerated treatment well    Behavior During Therapy WFL for tasks assessed/performed               Past Medical History:  Diagnosis Date   Anemia    Anxiety    Arthritis    Bipolar disorder (HCC)    Cardiomyopathy (HCC)    Complication of anesthesia    Awake but could not move   Depression    Diskitis 10/04/2023   GERD (gastroesophageal reflux disease)    Heart failure (HCC)    Acute   Heart murmur    related to VSD   High cholesterol    History of blood transfusion    related to OR (08/19/2016)   History of hiatal hernia    Hypertension    Hyperthyroidism    Hypotension 09/06/2023   Mild cognitive impairment 09/06/2018   Paroxysmal ventricular tachycardia (HCC)    Pneumonia    Rectovaginal fistula 09/06/2023   Tachycardia, unspecified    Type II diabetes mellitus (HCC)    UTI (urinary tract infection)    being treated with Keflex    Ventricular septal defect    Ventriculitis of brain due to bacteria 11/02/2022   Vertebral fracture, osteoporotic (HCC) 01/06/2023   Past Surgical History:  Procedure Laterality Date   ABDOMINAL HYSTERECTOMY     BACK SURGERY     CARDIAC CATHETERIZATION N/A 06/23/2015   Procedure: Left Heart Cath and Coronary Angiography;  Surgeon: Ezra GORMAN Shuck, MD;  Location: [facility] INVASIVE CV  LAB;  Service: Cardiovascular;  Laterality: N/A;   CARDIAC CATHETERIZATION  1960   VSD was so small; didn't need repaired   COLECTOMY, SIGMOID, ROBOT-ASSISTED N/A 11/30/2023   Procedure: COLECTOMY, SIGMOID, ROBOT-ASSISTED ROBOTIC LOW ANTERIOR RECTOSIGMOID RESECTION (LAR) WITH ANASTOMOSIS COLOVAGINAL FISTULA TAKEDOWN & REPAIR OMENTOPEXY OF VAGINA INTRAOPERATIVE ASSESSMENT OF TISSUE VASCULAR PERFUSION USING ICG (indocyanine green ) IMMUNOFLUORESCENCE TRANSVERSUS ABDOMINIS PLANE (TAP) BLOCK - BILATERAL;  Surgeon: Sheldon Standing, MD;  Location: [facility] ORS;  Service: General;  Late   CYSTOSCOPY WITH INDOCYANINE GREEN  IMAGING (ICG) N/A 11/30/2023   Procedure: Cystoscopy with bilateral ureteral FireFly injections Transurethral resection of bladder tumor--large;  Surgeon: Carolee Sherwood JONETTA DOUGLAS, MD;  Location: [facility];  Service: Urology;  Laterality: N/A;   EXAM UNDER ANESTHESIA WITH MANIPULATION OF HIP Right 06/02/2014   dr sheril   FLEXIBLE SIGMOIDOSCOPY N/A 11/30/2023   Procedure: KINGSTON SIDE;  Surgeon: Sheldon Standing, MD;  Location: [facility];  Service: General;  Laterality: N/A;   FRACTURE SURGERY     HERNIA REPAIR     HIP CLOSED REDUCTION Right 06/02/2014   Procedure: CLOSED MANIPULATION HIP;  Surgeon: Maude KANDICE sheril, MD;  Location: [facility] OR;  Service: Orthopedics;  Laterality: Right;  IR FLUORO GUIDED NEEDLE PLC ASPIRATION/INJECTION LOC  08/09/2023   JOINT REPLACEMENT     JOINT REPLACEMENT     POSTERIOR LUMBAR FUSION 4 LEVEL N/A 06/26/2022   Procedure: Lumbar One To Lumbar Five Posterior Instrumented Fusion;  Surgeon: Cheryle Debby LABOR, MD;  Location: [facility] OR;  Service: Neurosurgery;  Laterality: N/A;   REFRACTIVE SURGERY Bilateral    SHOULDER ARTHROSCOPY Right    SHOULDER OPEN ROTATOR CUFF REPAIR Right    SPINAL FUSION  1996   t10 down to my coccyx   SPINE HARDWARE REMOVAL     TOTAL ABDOMINAL HYSTERECTOMY     TOTAL HIP ARTHROPLASTY Right 05/10/2014   [place]      by dr lonni  olcott   TOTAL KNEE ARTHROPLASTY Left    TOTAL SHOULDER ARTHROPLASTY Left 08/19/2016   Procedure: TOTAL SHOULDER ARTHROPLASTY;  Surgeon: Eva Herring, MD;  Location: [facility] OR;  Service: Orthopedics;  Laterality: Left;  Left total shoulder replacement   Patient Active Problem List   Diagnosis Date Noted   COPD (chronic obstructive pulmonary disease) (HCC) 04/05/2024   Colovaginal fistula 11/30/2023   Diskitis 10/04/2023   Hypotension 09/06/2023   Fracture of tenth thoracic vertebra (HCC) 09/05/2023   History of discitis 09/05/2023   Senile osteoporosis 09/05/2023   History of urinary retention 09/05/2023   Vaginal atrophy 09/05/2023   Nocturia 09/05/2023   Vulvar ulcer 09/05/2023   Pulmonary nodule 08/02/2023   Non-traumatic compression fracture of vertebral column (HCC) 06/24/2023   Nonobstructive atherosclerosis of coronary artery 02/10/2023   Bilateral pseudophakia 02/03/2023   Vertebral fracture, osteoporotic (HCC) 01/06/2023   Ventriculitis of brain due to bacteria 11/02/2022   Anemia of chronic illness 10/12/2022   Bipolar disorder, in partial remission, most recent episode depressed (HCC) 10/07/2022   Adjustment disorder with mixed anxiety and depressed mood 09/30/2022   Chronic wound 09/25/2022   Hyperglycemia 09/25/2022   Chronic midline thoracic back pain 09/17/2022   Primary insomnia 09/17/2022   Chronic combined systolic and diastolic CHF (congestive heart failure) (HCC) 09/14/2022   Acute blood loss anemia 09/09/2022   Infection of lumbar spine (HCC) 09/03/2022   Malnutrition of moderate degree 08/20/2022   Hardware complicating wound infection 08/20/2022   Infection of deep incisional surgical site after procedure 08/20/2022   Aspiration pneumonia of both lungs (HCC) 08/19/2022   Acute respiratory failure with hypoxia (HCC) 08/19/2022   Sepsis (HCC) 08/19/2022   Seizure (HCC) 08/19/2022   Wound dehiscence 08/09/2022   Delayed surgical wound healing 07/08/2022    Fracture of lumbar spine without cord injury (HCC) 06/26/2022   Lumbar vertebral fracture (HCC) 06/25/2022   Mood disorder 09/29/2021   Generalized muscle weakness 04/30/2019   Mild cognitive impairment 09/06/2018   Hyponatremia 02/28/2018   S/P shoulder replacement, left 08/19/2016   Anxiety 10/17/2014   Acid reflux 10/17/2014   BP (high blood pressure) 10/17/2014   Arthritis, degenerative 10/17/2014   Adult hypothyroidism 10/17/2014   UTI (urinary tract infection) 06/03/2014   Fracture of bone adjacent to prosthesis 06/03/2014   Peri-prosthetic fracture of femur following total hip arthroplasty 06/02/2014   Urinary retention 05/13/2014   Chronic pain 05/10/2014   History of hip surgery 05/10/2014   Osteoarthritis 09/30/2011   UNSPECIFIED HEART FAILURE 06/24/2010   UNSPECIFIED CONGENITAL DEFECT OF SEPTAL CLOSURE 06/24/2010   Secondary cardiomyopathy (HCC) 03/18/2010   Tobacco use 03/18/2010   DM 06/13/2009   HYPERTENSION, UNSPECIFIED 06/13/2009   VENTRICULAR TACHYCARDIA 06/13/2009   VENTRICULAR SEPTAL DEFECT, CONGENITAL 06/13/2009   Essential (  primary) hypertension 06/13/2009   Diabetes mellitus type 2, noninsulin dependent (HCC) 06/13/2009    ONSET DATE: 01/05/2024 MD referral  REFERRING DIAG: R26.9 (ICD-10-CM) - Gait difficulty   THERAPY DIAG:  Unsteadiness on feet  Other abnormalities of gait and mobility  Muscle weakness (generalized)  Rationale for Evaluation and Treatment: Rehabilitation  SUBJECTIVE:                                                                                                                                                                                             SUBJECTIVE STATEMENT: Doing well, reports arms were sore last session but she felt like she had good muscle soreness not a lot of joint pain after last session.    Pt accompanied by: self  PERTINENT HISTORY: Chronic osteomyelitis with worsening back pain; Hx of multiple R hip  fractures with surgery to revisions, significant leg length difference with LLE longer than RLE, hx of infection post surgery in spine (treated on antibiotics) Per MD Note 01/09/2024:    Patient was seen by Dr. Elspeth Schultze who scheduled her for surgery and on  12/01/2023 she underwent   -ROBOTIC LOW ANTERIOR RECTOSIGMOID RESECTION (LAR) WITH ANASTOMOSIS -COLOVAGINAL FISTULA TAKEDOWN & REPAIR -OMENTOPEXY OF VAGINA -INTRAOPERATIVE ASSESSMENT OF TISSUE VASCULAR PERFUSION USING ICG (indocyanine green ) IMMUNOFLUORESCENCE -TRANSVERSUS ABDOMINIS PLANE (TAP) BLOCK - BILATERAL -FLEXIBLE SIGMOIDOSCOPY    Urology did joint surgery and did    1.  Cystoscopy with bilateral ureteral FireFly injections 2.  Transurethral resection of  possible bladder tumor--large   Pathology came back with:   A. BLADDER TUMOR, TRANSURETHERAL RESECTON:  Florid cystitis cystica and papillary cystitis  Negative for carcinoma   B. RECTOSIGMOID COLON RESECTION:  Diverticula with chronic inflammation, perforation, fistula and reactive  fibrosis  Negative for carcinoma  Five benign lymph nodes (0/5)   C. FINAL DISTAL MARGIN:  Benign colonic tissue         PAIN:  Are you having pain? Yes: NPRS scale: 5/10 Pain location: R hip/back Pain description: throbbing, grinding, sharp Aggravating factors: more activities, [place] Relieving factors: rest  PRECAUTIONS: Fall and Other: Recent colectomy-pt reports no restrictions Pt has history of osteoporosis and compression fractures RED FLAGS: None   WEIGHT BEARING RESTRICTIONS: No  FALLS: Has patient fallen in last 6 months? No  LIVING ENVIRONMENT: Lives with: lives with their family Lives in: House/apartment Stairs: long ramp in/out of home Has following equipment at home: Vannie - 2 wheeled and Wheelchair (manual)  PLOF: Needs assistance with gait and Vocation/Vocational requirements: retired Runner, broadcasting/film/video, Tree surgeon; enjoys spending time with  grandchildren in [place] and  [place]  PATIENT GOALS: To get stronger, to get back on a cane.  To use walker to get to the car (long ramp), get RLE into car better  OBJECTIVE:   TODAY'S TREATMENT: 05/01/24  Aquatic therapy at [facility] - pool temperature 92 degrees   Patient seen for aquatic therapy today.  Treatment took place in water  3.6-4.8 feet deep depending upon activity.  Patient entered and exited the pool via stairs using step to pattern w/ Bil rails at SBA level.   Exercises: -Water  walking warmup 4x18 ft forward > backward > 4x18 laterally unsupported SBA, added holding multi colored barbells by side while side stepping for 4 laps.  Tolerated well -Forward marching holding yellow barbells x 4 laps with cues for posture and slower movement.   -Standing in staggered stance, performing ant/post weight shifts without UE involvement x 5 reps>moving arms back (squeezing shoulder blades) when shifting post and then moving arms together in front of her with ant weight shift.  Performed x 10 reps each side total.  -For break from standing and to avoid increased soreness, had pt sit on bench with small step under feet for support, performing alt LAQ x 10 reps with 3.5 lb ankle weights  -Seated straight leg hip abd/add x 15 reps with ankle weights with light UE support on wall.  -Seated straight leg flutter kicks x 20 reps (2 sets) with ankle weights with light UE support to further engage core mm.   -Sit<>stand from bench with step under feet x 10 reps with PT holding yellow kick board under RLE to even out WB during transitional movement which seemed to work very well.  Cues for slow controlled descent.   -Ended with forward walking x 2 more laps with barbells for support.       Patient requires buoyancy of the water  for support for reduced fall risk with gait training and balance exercises with SBA support and verbal cuing and return demonstration. Exercises able to be  performed safely in water  without the risk of fall compared to those same exercises performed on land; viscosity of water  needed for resistance for strengthening. Current of water  provides perturbations for challenging static and dynamic balance.        Access Code: [email or web address] URL: [email or web address] Date: 03/27/2024 Prepared by: [facility] - Outpatient  Rehab - [facility]  Program Notes Seated leg slides out and in;  have a pillow case under your right foot-slide your foot out and in, 3 x 10 reps.  You can have a 3-5# ankle weight on your ankle  Exercises - Seated Hip Adduction Isometrics with Ball  - 1 x daily - 7 x weekly - 3 sets - 10 reps - Seated Hip Abduction with Resistance  - 1 x daily - 7 x weekly - 3 sets - 10 reps - Sit to Stand with Resistance Around Legs  - 1 x daily - 7 x weekly - 3 sets - 5 reps - Seated Shoulder Row with Anchored Resistance  - 1 x daily - 5 x weekly - 2 sets - 10 reps - Seated Anti-Rotation Press With Anchored Resistance  - 1 x daily - 7 x weekly - 3 sets - 10 reps - Seated Hamstring Curl with Anchored Resistance  - 1 x daily - 4 x weekly - 3 sets - 10 reps - Supine Quad Set  - 1 x daily - 5 x weekly - 3 sets - 10 reps - 3  sec hold - Short Arc Quad with Ankle Weight  - 1 x daily - 4 x weekly - 3 sets - 10 reps  PATIENT EDUCATION: Education details: aquatic rationale  Person educated: Patient Education method: Explanation, Demonstration, and Handouts Education comprehension: verbalized understanding and returned demonstration        --------------------------------------------------- Note: Objective measures were completed at Evaluation unless otherwise noted.  DIAGNOSTIC FINDINGS: MRI 09/2023   IMPRESSION: 1. Postoperative changes of T11-S1 laminectomy and posterior spinal fusion with right posterior spinal instrumentation from L2-L5. On prior CT, there is evidence of fracture through the left posterior fusion mass at  L4-5, correlating with edema is seen in the left pedicle of L4 on today's study. 2. Decreased edema within the L4 and L5 vertebral bodies and posterior elements, as well as within the L4-5 and L3-4 discs, likely reactive and related to the above fracture. 3. Loculated simple fluid within the thecal sac with some marginalized cauda equina nerve roots, most consistent with arachnoiditis. 4. No spinal canal stenosis or neural foraminal narrowing.  COGNITION: Overall cognitive status: History of cognitive impairments - at baseline   SENSATION: Light touch: Impaired  and to light touch-see below Reports some numbness L foot  POSTURE: Leg length diffierence  LOWER EXTREMITY ROM:     Active  Right Eval Left Eval  Hip flexion    Hip extension    Hip abduction    Hip adduction    Hip internal rotation    Hip external rotation    Knee flexion    Knee extension    Ankle dorsiflexion    Ankle plantarflexion    Ankle inversion    Ankle eversion     (Blank rows = not tested)  LOWER EXTREMITY MMT:    MMT Right Eval Left Eval  Hip flexion 3- 3+  Hip extension    Hip abduction 4 4  Hip adduction 4 4  Hip internal rotation    Hip external rotation    Knee flexion 4 4  Knee extension 3+ 4  Ankle dorsiflexion 3+ 3+  Ankle plantarflexion    Ankle inversion    Ankle eversion    (Blank rows = not tested)   TRANSFERS: Sit to stand: SBA  Assistive device utilized: Environmental consultant - 2 wheeled     Stand to sit: SBA  Assistive device utilized: Environmental consultant - 2 wheeled      GAIT: Findings: Gait Characteristics: excess lateral weightshift through trunk due to significant leg length difference and step through pattern, Distance walked: 50 ft, Assistive device utilized:Walker - 2 wheeled, Level of assistance: CGA, and Comments: NA  FUNCTIONAL TESTS:  5 times sit to stand: 14.44 sec with BUE support Timed up and go (TUG): 24.53 sec 2 minute walk test: NT 10 meter walk test: 21.34 sec (1.54  ft/sec) TUG cognitive: (naming colors) 28.62 sec 23M walk back:  42.16 sec with RW Stroop gait:  13.25 sec in 10 ft (naming color words)-0.75 ft/sec   18.75 sec in 10 feet (naming printed colors)-0.53 ft/sec                                                                          PATIENT EDUCATION: Education details: See above.  Person educated: Patient Education method: Explanation Education comprehension: verbalized understanding  HOME EXERCISE PROGRAM: Not yet initiated Did perform eccentric hip flexion/extension in sitting  GOALS: Goals reviewed with patient? Yes  SHORT TERM GOALS: Target date: 02/24/2024  Pt will be independent with HEP for improved strength, balance, gait. Baseline: printed out and reviewed 02/23/24 Goal status: MET 02/23/24  2.  Pt will improve TUG score to less than or equal to 18 sec for decreased fall risk. Baseline: 24.53 sec; 14.54 sec with 4WW 02/23/24 Goal status: MET 02/23/24  3.  Pt will improve 3 M Walk backwards to less than or equal to 30 seconds for decreased fall risk. Baseline: 42.16 sec; 9.96 sec 02/23/24 Goal status: MET 02/23/24  4.  Pt will report at least 50% improvement in car transfers.  Baseline:  difficulty with RLE; Patient reports that she has been able to swing her LEs into the car by herself- reports 50% improvement. 02/23/24  Goal status:  MET 02/23/24  LONG TERM GOALS: Target date: 06/07/2024    Pt will be independent with HEP for improved strength, balance, gait. Baseline:  Goal status: IN PROGRESS  2.  Pt will improve gait velocity to at least 1.8 ft/sec for improved gait efficiency and safety. Baseline: 1.54 ft/sec; 1.8 ft/sec w/ 5TT Goal status: MET  3.  Pt will improve gait velocity with dual task to at least 1 ft/sec for improved gait and decreased fall risk. Baseline: 0.53-0.75 ft/sec with Stroop over 10 ft Goal status: IN PROGRESS  4.  Pt will improve 5x sit<>stand to less than or equal to 11.5 sec with light UE support  to demonstrate improved functional strength and transfer efficiency. Baseline: 14.44 sec; 13.9 sec Goal status: IN PROGRESS 04/19/24  5.  Pt will verbalize plans for continued community fitness upon d/c from PT to maximize gains made in PT. Baseline:  Goal status: IN PROGRESS  6.  Pt will ambulate at least 200 ft in 2 minute walk, for improved gait efficiency and endurance.  Baseline: 100 ft in 1:30; 215 ft w/ 4WW x 2 min  Goal status:  MET   7.  Demo/report ability to negotiate ramp at her home w/ RW and load/unload AD into care to enable ability to enter/leave home  Baseline: unable  Goal status: INITIAL  8.  Demo ability to ambulate up to 300 m (984 ft) for community-level ambulation w/ AD  Baseline: 215 ft w/ 5TT  Goal status: INITIAL  ASSESSMENT:  CLINICAL IMPRESSION: Continue to focus on breaking session up with standing and seated tasks to reduce pain and soreness.  PT continued with 3.5 lb ankle weights to seated tasks today which she tolerated well but did report some adductor soreness at end of exercises.  We did slightly more standing today vs last session so will assess how she felt at next sessionj.   OBJECTIVE IMPAIRMENTS: Abnormal gait, decreased balance, decreased mobility, difficulty walking, decreased strength, impaired sensation, postural dysfunction, and pain.   ACTIVITY LIMITATIONS: sitting, standing, transfers, locomotion level, and caring for others  PARTICIPATION LIMITATIONS: driving, shopping, community activity, and travel  PERSONAL FACTORS: 3+ comorbidities: see PMH above are also affecting patient's functional outcome.   REHAB POTENTIAL: Good  CLINICAL DECISION MAKING: Evolving/moderate complexity  EVALUATION COMPLEXITY: Moderate  PLAN:  PT FREQUENCY: 2x/week  PT DURATION: 6 weeks   PLANNED INTERVENTIONS: [id number]- PT Re-evaluation, 97750- Physical Performance Testing, 97110-Therapeutic exercises, 97530- Therapeutic activity, [id number]- Neuromuscular  re-education, 97535- Self Care, 02859- Manual therapy, 02883-  Gait training, 04007- Canalith repositioning, [id number]- Aquatic Therapy, Patient/Family education, and Balance training  PLAN FOR NEXT SESSION: .  Ask about back pain and progress standing and gait tolerance; RLE strength for car transfers; pt requests not to perform Nustep and physioball activities d/t hip pain; pt reports that she would like to work towards being able to walk down her ramp and into her car-work on outdoor sidewalk distance to simulate ramp and lifting/loading AD in/out of car  AQUATICS: Frequency: 1-2x/week Duration: 6 weeks  Special Instruction: chronic hip and back pain, imbalance   Aquatics:  continue core work - try ankle weights/aquatic cuffs in sitting  Damien Fought, PT, MPT [facility] [address] [place], [place], [address] Phone: [phone]   Fax:  [phone] 05/08/24, 12:48 PM

## 2024-05-10 ENCOUNTER — Ambulatory Visit

## 2024-05-10 DIAGNOSIS — R2681 Unsteadiness on feet: Secondary | ICD-10-CM | POA: Diagnosis not present

## 2024-05-10 DIAGNOSIS — R2689 Other abnormalities of gait and mobility: Secondary | ICD-10-CM

## 2024-05-10 DIAGNOSIS — M6281 Muscle weakness (generalized): Secondary | ICD-10-CM

## 2024-05-10 NOTE — Therapy (Signed)
 OUTPATIENT PHYSICAL THERAPY NEURO TREATMENT NOTE   Patient Name: [name] MRN: [id number] DOB:[birth year], 68 y.o., female Today's Date: 05/10/2024   PCP: Rolinda Millman, MD  REFERRING PROVIDER: Urbano Albright, MD        END OF SESSION:  PT End of Session - 05/10/24 1409     Visit Number 21    Number of Visits 31    Date for Recertification  06/07/24    Authorization Type BCBS Medicare    Progress Note Due on Visit 28    PT Start Time 1405    PT Stop Time 1435   requests early stop   PT Time Calculation (min) 30 min    Equipment Utilized During Treatment Other (comment)   floatation devices as needed for safety   Activity Tolerance Patient tolerated treatment well    Behavior During Therapy WFL for tasks assessed/performed               Past Medical History:  Diagnosis Date   Anemia    Anxiety    Arthritis    Bipolar disorder (HCC)    Cardiomyopathy (HCC)    Complication of anesthesia    Awake but could not move   Depression    Diskitis 10/04/2023   GERD (gastroesophageal reflux disease)    Heart failure (HCC)    Acute   Heart murmur    related to VSD   High cholesterol    History of blood transfusion    related to OR (08/19/2016)   History of hiatal hernia    Hypertension    Hyperthyroidism    Hypotension 09/06/2023   Mild cognitive impairment 09/06/2018   Paroxysmal ventricular tachycardia (HCC)    Pneumonia    Rectovaginal fistula 09/06/2023   Tachycardia, unspecified    Type II diabetes mellitus (HCC)    UTI (urinary tract infection)    being treated with Keflex    Ventricular septal defect    Ventriculitis of brain due to bacteria 11/02/2022   Vertebral fracture, osteoporotic (HCC) 01/06/2023   Past Surgical History:  Procedure Laterality Date   ABDOMINAL HYSTERECTOMY     BACK SURGERY     CARDIAC CATHETERIZATION N/A 06/23/2015   Procedure: Left Heart Cath and Coronary Angiography;  Surgeon: Ezra GORMAN Shuck, MD;   Location: [facility] INVASIVE CV LAB;  Service: Cardiovascular;  Laterality: N/A;   CARDIAC CATHETERIZATION  1960   VSD was so small; didn't need repaired   COLECTOMY, SIGMOID, ROBOT-ASSISTED N/A 11/30/2023   Procedure: COLECTOMY, SIGMOID, ROBOT-ASSISTED ROBOTIC LOW ANTERIOR RECTOSIGMOID RESECTION (LAR) WITH ANASTOMOSIS COLOVAGINAL FISTULA TAKEDOWN & REPAIR OMENTOPEXY OF VAGINA INTRAOPERATIVE ASSESSMENT OF TISSUE VASCULAR PERFUSION USING ICG (indocyanine green ) IMMUNOFLUORESCENCE TRANSVERSUS ABDOMINIS PLANE (TAP) BLOCK - BILATERAL;  Surgeon: Sheldon Standing, MD;  Location: [facility] ORS;  Service: General;  Late   CYSTOSCOPY WITH INDOCYANINE GREEN  IMAGING (ICG) N/A 11/30/2023   Procedure: Cystoscopy with bilateral ureteral FireFly injections Transurethral resection of bladder tumor--large;  Surgeon: Carolee Sherwood JONETTA DOUGLAS, MD;  Location: [facility];  Service: Urology;  Laterality: N/A;   EXAM UNDER ANESTHESIA WITH MANIPULATION OF HIP Right 06/02/2014   dr sheril   FLEXIBLE SIGMOIDOSCOPY N/A 11/30/2023   Procedure: KINGSTON SIDE;  Surgeon: Sheldon Standing, MD;  Location: [facility];  Service: General;  Laterality: N/A;   FRACTURE SURGERY     HERNIA REPAIR     HIP CLOSED REDUCTION Right 06/02/2014   Procedure: CLOSED MANIPULATION HIP;  Surgeon: Maude KANDICE sheril, MD;  Location: [facility] OR;  Service: Orthopedics;  Laterality: Right;   IR FLUORO GUIDED NEEDLE PLC ASPIRATION/INJECTION LOC  08/09/2023   JOINT REPLACEMENT     JOINT REPLACEMENT     POSTERIOR LUMBAR FUSION 4 LEVEL N/A 06/26/2022   Procedure: Lumbar One To Lumbar Five Posterior Instrumented Fusion;  Surgeon: Cheryle Debby LABOR, MD;  Location: [facility] OR;  Service: Neurosurgery;  Laterality: N/A;   REFRACTIVE SURGERY Bilateral    SHOULDER ARTHROSCOPY Right    SHOULDER OPEN ROTATOR CUFF REPAIR Right    SPINAL FUSION  1996   t10 down to my coccyx   SPINE HARDWARE REMOVAL     TOTAL ABDOMINAL HYSTERECTOMY     TOTAL HIP ARTHROPLASTY Right 05/10/2014   [place]       by dr lonni olcott   TOTAL KNEE ARTHROPLASTY Left    TOTAL SHOULDER ARTHROPLASTY Left 08/19/2016   Procedure: TOTAL SHOULDER ARTHROPLASTY;  Surgeon: Eva Herring, MD;  Location: [facility] OR;  Service: Orthopedics;  Laterality: Left;  Left total shoulder replacement   Patient Active Problem List   Diagnosis Date Noted   COPD (chronic obstructive pulmonary disease) (HCC) 04/05/2024   Colovaginal fistula 11/30/2023   Diskitis 10/04/2023   Hypotension 09/06/2023   Fracture of tenth thoracic vertebra (HCC) 09/05/2023   History of discitis 09/05/2023   Senile osteoporosis 09/05/2023   History of urinary retention 09/05/2023   Vaginal atrophy 09/05/2023   Nocturia 09/05/2023   Vulvar ulcer 09/05/2023   Pulmonary nodule 08/02/2023   Non-traumatic compression fracture of vertebral column (HCC) 06/24/2023   Nonobstructive atherosclerosis of coronary artery 02/10/2023   Bilateral pseudophakia 02/03/2023   Vertebral fracture, osteoporotic (HCC) 01/06/2023   Ventriculitis of brain due to bacteria 11/02/2022   Anemia of chronic illness 10/12/2022   Bipolar disorder, in partial remission, most recent episode depressed (HCC) 10/07/2022   Adjustment disorder with mixed anxiety and depressed mood 09/30/2022   Chronic wound 09/25/2022   Hyperglycemia 09/25/2022   Chronic midline thoracic back pain 09/17/2022   Primary insomnia 09/17/2022   Chronic combined systolic and diastolic CHF (congestive heart failure) (HCC) 09/14/2022   Acute blood loss anemia 09/09/2022   Infection of lumbar spine (HCC) 09/03/2022   Malnutrition of moderate degree 08/20/2022   Hardware complicating wound infection 08/20/2022   Infection of deep incisional surgical site after procedure 08/20/2022   Aspiration pneumonia of both lungs (HCC) 08/19/2022   Acute respiratory failure with hypoxia (HCC) 08/19/2022   Sepsis (HCC) 08/19/2022   Seizure (HCC) 08/19/2022   Wound dehiscence 08/09/2022   Delayed surgical wound  healing 07/08/2022   Fracture of lumbar spine without cord injury (HCC) 06/26/2022   Lumbar vertebral fracture (HCC) 06/25/2022   Mood disorder 09/29/2021   Generalized muscle weakness 04/30/2019   Mild cognitive impairment 09/06/2018   Hyponatremia 02/28/2018   S/P shoulder replacement, left 08/19/2016   Anxiety 10/17/2014   Acid reflux 10/17/2014   BP (high blood pressure) 10/17/2014   Arthritis, degenerative 10/17/2014   Adult hypothyroidism 10/17/2014   UTI (urinary tract infection) 06/03/2014   Fracture of bone adjacent to prosthesis 06/03/2014   Peri-prosthetic fracture of femur following total hip arthroplasty 06/02/2014   Urinary retention 05/13/2014   Chronic pain 05/10/2014   History of hip surgery 05/10/2014   Osteoarthritis 09/30/2011   UNSPECIFIED HEART FAILURE 06/24/2010   UNSPECIFIED CONGENITAL DEFECT OF SEPTAL CLOSURE 06/24/2010   Secondary cardiomyopathy (HCC) 03/18/2010   Tobacco use 03/18/2010   DM 06/13/2009   HYPERTENSION, UNSPECIFIED 06/13/2009   VENTRICULAR TACHYCARDIA 06/13/2009   VENTRICULAR SEPTAL DEFECT, CONGENITAL  06/13/2009   Essential (primary) hypertension 06/13/2009   Diabetes mellitus type 2, noninsulin dependent (HCC) 06/13/2009    ONSET DATE: 01/05/2024 MD referral  REFERRING DIAG: R26.9 (ICD-10-CM) - Gait difficulty   THERAPY DIAG:  Unsteadiness on feet  Other abnormalities of gait and mobility  Muscle weakness (generalized)  Rationale for Evaluation and Treatment: Rehabilitation  SUBJECTIVE:                                                                                                                                                                                             SUBJECTIVE STATEMENT: Not feeling the best today. Not sure if I'm up for it today.  Pt accompanied by: self  PERTINENT HISTORY: Chronic osteomyelitis with worsening back pain; Hx of multiple R hip fractures with surgery to revisions, significant leg length  difference with LLE longer than RLE, hx of infection post surgery in spine (treated on antibiotics) Per MD Note 01/09/2024:    Patient was seen by Dr. Elspeth Schultze who scheduled her for surgery and on  12/01/2023 she underwent   -ROBOTIC LOW ANTERIOR RECTOSIGMOID RESECTION (LAR) WITH ANASTOMOSIS -COLOVAGINAL FISTULA TAKEDOWN & REPAIR -OMENTOPEXY OF VAGINA -INTRAOPERATIVE ASSESSMENT OF TISSUE VASCULAR PERFUSION USING ICG (indocyanine green ) IMMUNOFLUORESCENCE -TRANSVERSUS ABDOMINIS PLANE (TAP) BLOCK - BILATERAL -FLEXIBLE SIGMOIDOSCOPY    Urology did joint surgery and did    1.  Cystoscopy with bilateral ureteral FireFly injections 2.  Transurethral resection of  possible bladder tumor--large   Pathology came back with:   A. BLADDER TUMOR, TRANSURETHERAL RESECTON:  Florid cystitis cystica and papillary cystitis  Negative for carcinoma   B. RECTOSIGMOID COLON RESECTION:  Diverticula with chronic inflammation, perforation, fistula and reactive  fibrosis  Negative for carcinoma  Five benign lymph nodes (0/5)   C. FINAL DISTAL MARGIN:  Benign colonic tissue         PAIN:  Are you having pain? Yes: NPRS scale: 5/10 Pain location: R hip/back Pain description: throbbing, grinding, sharp Aggravating factors: more activities, [place] Relieving factors: rest  PRECAUTIONS: Fall and Other: Recent colectomy-pt reports no restrictions Pt has history of osteoporosis and compression fractures RED FLAGS: None   WEIGHT BEARING RESTRICTIONS: No  FALLS: Has patient fallen in last 6 months? No  LIVING ENVIRONMENT: Lives with: lives with their family Lives in: House/apartment Stairs: long ramp in/out of home Has following equipment at home: Vannie - 2 wheeled and Wheelchair (manual)  PLOF: Needs assistance with gait and Vocation/Vocational requirements: retired Runner, broadcasting/film/video, Tree surgeon; enjoys spending time with grandchildren in [place] and goes to [employer]  weekly  PATIENT GOALS: To get stronger,  to get back on a cane.  To use walker to get to the car (long ramp), get RLE into car better  OBJECTIVE:   TODAY'S TREATMENT: 05/10/24 Activity Comments  HEP review Discussion of open chain PRE as preferential modality and education about endurance training vs strength  Gait training Trials w/ SBA using RW for negotiating over obstacles to mimic demands of home environment  Curb negotiation w/ RW and SBA-CGA                 TODAY'S TREATMENT: 05/01/24  Aquatic therapy at [facility] - pool temperature 92 degrees   Patient seen for aquatic therapy today.  Treatment took place in water  3.6-4.8 feet deep depending upon activity.  Patient entered and exited the pool via stairs using step to pattern w/ Bil rails at SBA level.   Exercises: -Water  walking warmup 4x18 ft forward > backward > 4x18 laterally unsupported SBA, added holding multi colored barbells by side while side stepping for 4 laps.  Tolerated well -Forward marching holding yellow barbells x 4 laps with cues for posture and slower movement.   -Standing in staggered stance, performing ant/post weight shifts without UE involvement x 5 reps>moving arms back (squeezing shoulder blades) when shifting post and then moving arms together in front of her with ant weight shift.  Performed x 10 reps each side total.  -For break from standing and to avoid increased soreness, had pt sit on bench with small step under feet for support, performing alt LAQ x 10 reps with 3.5 lb ankle weights  -Seated straight leg hip abd/add x 15 reps with ankle weights with light UE support on wall.  -Seated straight leg flutter kicks x 20 reps (2 sets) with ankle weights with light UE support to further engage core mm.   -Sit<>stand from bench with step under feet x 10 reps with PT holding yellow kick board under RLE to even out WB during transitional movement which seemed to work very well.  Cues for slow controlled  descent.   -Ended with forward walking x 2 more laps with barbells for support.       Patient requires buoyancy of the water  for support for reduced fall risk with gait training and balance exercises with SBA support and verbal cuing and return demonstration. Exercises able to be performed safely in water  without the risk of fall compared to those same exercises performed on land; viscosity of water  needed for resistance for strengthening. Current of water  provides perturbations for challenging static and dynamic balance.        Access Code: [email or web address] URL: [email or web address] Date: 03/27/2024 Prepared by: [facility] - Outpatient  Rehab - [facility]  Program Notes Seated leg slides out and in;  have a pillow case under your right foot-slide your foot out and in, 3 x 10 reps.  You can have a 3-5# ankle weight on your ankle  Exercises - Seated Hip Adduction Isometrics with Ball  - 1 x daily - 7 x weekly - 3 sets - 10 reps - Seated Hip Abduction with Resistance  - 1 x daily - 7 x weekly - 3 sets - 10 reps - Sit to Stand with Resistance Around Legs  - 1 x daily - 7 x weekly - 3 sets - 5 reps - Seated Shoulder Row with Anchored Resistance  - 1 x daily - 5 x weekly - 2 sets - 10 reps - Seated Anti-Rotation Press With Anchored Resistance  - 1 x daily -  7 x weekly - 3 sets - 10 reps - Seated Hamstring Curl with Anchored Resistance  - 1 x daily - 4 x weekly - 3 sets - 10 reps - Supine Quad Set  - 1 x daily - 5 x weekly - 3 sets - 10 reps - 3 sec hold - Short Arc Quad with Ankle Weight  - 1 x daily - 4 x weekly - 3 sets - 10 reps  PATIENT EDUCATION: Education details: aquatic rationale  Person educated: Patient Education method: Explanation, Demonstration, and Handouts Education comprehension: verbalized understanding and returned demonstration        --------------------------------------------------- Note: Objective measures were completed at Evaluation unless  otherwise noted.  DIAGNOSTIC FINDINGS: MRI 09/2023   IMPRESSION: 1. Postoperative changes of T11-S1 laminectomy and posterior spinal fusion with right posterior spinal instrumentation from L2-L5. On prior CT, there is evidence of fracture through the left posterior fusion mass at L4-5, correlating with edema is seen in the left pedicle of L4 on today's study. 2. Decreased edema within the L4 and L5 vertebral bodies and posterior elements, as well as within the L4-5 and L3-4 discs, likely reactive and related to the above fracture. 3. Loculated simple fluid within the thecal sac with some marginalized cauda equina nerve roots, most consistent with arachnoiditis. 4. No spinal canal stenosis or neural foraminal narrowing.  COGNITION: Overall cognitive status: History of cognitive impairments - at baseline   SENSATION: Light touch: Impaired  and to light touch-see below Reports some numbness L foot  POSTURE: Leg length diffierence  LOWER EXTREMITY ROM:     Active  Right Eval Left Eval  Hip flexion    Hip extension    Hip abduction    Hip adduction    Hip internal rotation    Hip external rotation    Knee flexion    Knee extension    Ankle dorsiflexion    Ankle plantarflexion    Ankle inversion    Ankle eversion     (Blank rows = not tested)  LOWER EXTREMITY MMT:    MMT Right Eval Left Eval  Hip flexion 3- 3+  Hip extension    Hip abduction 4 4  Hip adduction 4 4  Hip internal rotation    Hip external rotation    Knee flexion 4 4  Knee extension 3+ 4  Ankle dorsiflexion 3+ 3+  Ankle plantarflexion    Ankle inversion    Ankle eversion    (Blank rows = not tested)   TRANSFERS: Sit to stand: SBA  Assistive device utilized: Environmental consultant - 2 wheeled     Stand to sit: SBA  Assistive device utilized: Environmental consultant - 2 wheeled      GAIT: Findings: Gait Characteristics: excess lateral weightshift through trunk due to significant leg length difference and step through  pattern, Distance walked: 50 ft, Assistive device utilized:Walker - 2 wheeled, Level of assistance: CGA, and Comments: NA  FUNCTIONAL TESTS:  5 times sit to stand: 14.44 sec with BUE support Timed up and go (TUG): 24.53 sec 2 minute walk test: NT 10 meter walk test: 21.34 sec (1.54 ft/sec) TUG cognitive: (naming colors) 28.62 sec 72M walk back:  42.16 sec with RW Stroop gait:  13.25 sec in 10 ft (naming color words)-0.75 ft/sec   18.75 sec in 10 feet (naming printed colors)-0.53 ft/sec  PATIENT EDUCATION: Education details: See above. Person educated: Patient Education method: Explanation Education comprehension: verbalized understanding  HOME EXERCISE PROGRAM: Not yet initiated Did perform eccentric hip flexion/extension in sitting  GOALS: Goals reviewed with patient? Yes  SHORT TERM GOALS: Target date: 02/24/2024  Pt will be independent with HEP for improved strength, balance, gait. Baseline: printed out and reviewed 02/23/24 Goal status: MET 02/23/24  2.  Pt will improve TUG score to less than or equal to 18 sec for decreased fall risk. Baseline: 24.53 sec; 14.54 sec with 4WW 02/23/24 Goal status: MET 02/23/24  3.  Pt will improve 3 M Walk backwards to less than or equal to 30 seconds for decreased fall risk. Baseline: 42.16 sec; 9.96 sec 02/23/24 Goal status: MET 02/23/24  4.  Pt will report at least 50% improvement in car transfers.  Baseline:  difficulty with RLE; Patient reports that she has been able to swing her LEs into the car by herself- reports 50% improvement. 02/23/24  Goal status:  MET 02/23/24  LONG TERM GOALS: Target date: 06/07/2024    Pt will be independent with HEP for improved strength, balance, gait. Baseline:  Goal status: IN PROGRESS  2.  Pt will improve gait velocity to at least 1.8 ft/sec for improved gait efficiency and safety. Baseline: 1.54 ft/sec; 1.8 ft/sec w/ 5TT Goal status:  MET  3.  Pt will improve gait velocity with dual task to at least 1 ft/sec for improved gait and decreased fall risk. Baseline: 0.53-0.75 ft/sec with Stroop over 10 ft Goal status: IN PROGRESS  4.  Pt will improve 5x sit<>stand to less than or equal to 11.5 sec with light UE support to demonstrate improved functional strength and transfer efficiency. Baseline: 14.44 sec; 13.9 sec Goal status: IN PROGRESS 04/19/24  5.  Pt will verbalize plans for continued community fitness upon d/c from PT to maximize gains made in PT. Baseline:  Goal status: IN PROGRESS  6.  Pt will ambulate at least 200 ft in 2 minute walk, for improved gait efficiency and endurance.  Baseline: 100 ft in 1:30; 215 ft w/ 4WW x 2 min  Goal status:  MET   7.  Demo/report ability to negotiate ramp at her home w/ RW and load/unload AD into care to enable ability to enter/leave home  Baseline: unable  Goal status: INITIAL  8.  Demo ability to ambulate up to 300 m (984 ft) for community-level ambulation w/ AD  Baseline: 215 ft w/ 5TT  Goal status: INITIAL  ASSESSMENT:  CLINICAL IMPRESSION: Education regarding different methods of open chain strength for improving activity tolerance/endurance. Gait training to improve safety with negotiating obstacles w/ RW as pt reports difficulty with ramp and dip in ground to enter/exit home. Demo SBA for lifting RW and stepping over w/ good safety awareness demo. Pt requests early stop to tx due to personal reasons.   OBJECTIVE IMPAIRMENTS: Abnormal gait, decreased balance, decreased mobility, difficulty walking, decreased strength, impaired sensation, postural dysfunction, and pain.   ACTIVITY LIMITATIONS: sitting, standing, transfers, locomotion level, and caring for others  PARTICIPATION LIMITATIONS: driving, shopping, community activity, and travel  PERSONAL FACTORS: 3+ comorbidities: see PMH above are also affecting patient's functional outcome.   REHAB POTENTIAL:  Good  CLINICAL DECISION MAKING: Evolving/moderate complexity  EVALUATION COMPLEXITY: Moderate  PLAN:  PT FREQUENCY: 2x/week  PT DURATION: 6 weeks   PLANNED INTERVENTIONS: [id number]- PT Re-evaluation, 97750- Physical Performance Testing, 97110-Therapeutic exercises, 97530- Therapeutic activity, [id number]- Neuromuscular re-education, 97535- Self Care, [id number]- Manual  therapy, [id number]- Gait training, 04007- Canalith repositioning, [id number]- Aquatic Therapy, Patient/Family education, and Balance training  PLAN FOR NEXT SESSION: .  Ask about back pain and progress standing and gait tolerance; RLE strength for car transfers; pt requests not to perform Nustep and physioball activities d/t hip pain; pt reports that she would like to work towards being able to walk down her ramp and into her car-work on outdoor sidewalk distance to simulate ramp and lifting/loading AD in/out of car  AQUATICS: Frequency: 1-2x/week Duration: 6 weeks  Special Instruction: chronic hip and back pain, imbalance   Aquatics:  continue core work - try ankle weights/aquatic cuffs in sitting  2:43 PM, 05/10/24 M. Kelly Stepheny Canal, PT, DPT Physical Therapist- [place] Office Number: [phone]

## 2024-05-15 ENCOUNTER — Encounter: Payer: Self-pay | Admitting: Rehabilitation

## 2024-05-15 ENCOUNTER — Ambulatory Visit: Admitting: Rehabilitation

## 2024-05-15 NOTE — Therapy (Deleted)
 OUTPATIENT PHYSICAL THERAPY NEURO TREATMENT NOTE   Patient Name: [name] MRN: [id number] DOB:April 15, 1956, 68 y.o., female Today's Date: 05/15/2024   PCP: Rolinda Millman, MD  REFERRING PROVIDER: Urbano Albright, MD        END OF SESSION:  PT End of Session - 05/15/24 0823     Visit Number 22    Number of Visits 31    Date for Recertification  06/07/24    Authorization Type BCBS Medicare    Progress Note Due on Visit 28    Equipment Utilized During Treatment Other (comment)   floatation devices as needed for safety   Activity Tolerance Patient tolerated treatment well    Behavior During Therapy [facility] for tasks assessed/performed               Past Medical History:  Diagnosis Date   Anemia    Anxiety    Arthritis    Bipolar disorder (HCC)    Cardiomyopathy (HCC)    Complication of anesthesia    Awake but could not move   Depression    Diskitis 10/04/2023   GERD (gastroesophageal reflux disease)    Heart failure (HCC)    Acute   Heart murmur    related to VSD   High cholesterol    History of blood transfusion    related to OR (08/19/2016)   History of hiatal hernia    Hypertension    Hyperthyroidism    Hypotension 09/06/2023   Mild cognitive impairment 09/06/2018   Paroxysmal ventricular tachycardia (HCC)    Pneumonia    Rectovaginal fistula 09/06/2023   Tachycardia, unspecified    Type II diabetes mellitus (HCC)    UTI (urinary tract infection)    being treated with Keflex    Ventricular septal defect    Ventriculitis of brain due to bacteria 11/02/2022   Vertebral fracture, osteoporotic (HCC) 01/06/2023   Past Surgical History:  Procedure Laterality Date   ABDOMINAL HYSTERECTOMY     BACK SURGERY     CARDIAC CATHETERIZATION N/A 06/23/2015   Procedure: Left Heart Cath and Coronary Angiography;  Surgeon: Ezra GORMAN Shuck, MD;  Location: [facility] INVASIVE CV LAB;  Service: Cardiovascular;  Laterality: N/A;   CARDIAC CATHETERIZATION  1960    VSD was so small; didn't need repaired   COLECTOMY, SIGMOID, ROBOT-ASSISTED N/A 11/30/2023   Procedure: COLECTOMY, SIGMOID, ROBOT-ASSISTED ROBOTIC LOW ANTERIOR RECTOSIGMOID RESECTION (LAR) WITH ANASTOMOSIS COLOVAGINAL FISTULA TAKEDOWN & REPAIR OMENTOPEXY OF VAGINA INTRAOPERATIVE ASSESSMENT OF TISSUE VASCULAR PERFUSION USING ICG (indocyanine green ) IMMUNOFLUORESCENCE TRANSVERSUS ABDOMINIS PLANE (TAP) BLOCK - BILATERAL;  Surgeon: Sheldon Standing, MD;  Location: [facility] ORS;  Service: General;  Late   CYSTOSCOPY WITH INDOCYANINE GREEN  IMAGING (ICG) N/A 11/30/2023   Procedure: Cystoscopy with bilateral ureteral FireFly injections Transurethral resection of bladder tumor--large;  Surgeon: Carolee Sherwood JONETTA DOUGLAS, MD;  Location: [facility] ORS;  Service: Urology;  Laterality: N/A;   EXAM UNDER ANESTHESIA WITH MANIPULATION OF HIP Right 06/02/2014   dr sheril   FLEXIBLE SIGMOIDOSCOPY N/A 11/30/2023   Procedure: KINGSTON SIDE;  Surgeon: Sheldon Standing, MD;  Location: [facility] ORS;  Service: General;  Laterality: N/A;   FRACTURE SURGERY     HERNIA REPAIR     HIP CLOSED REDUCTION Right 06/02/2014   Procedure: CLOSED MANIPULATION HIP;  Surgeon: Maude KANDICE Sheril, MD;  Location: [facility] OR;  Service: Orthopedics;  Laterality: Right;   IR FLUORO GUIDED NEEDLE PLC ASPIRATION/INJECTION LOC  08/09/2023   JOINT REPLACEMENT     JOINT REPLACEMENT  POSTERIOR LUMBAR FUSION 4 LEVEL N/A 06/26/2022   Procedure: Lumbar One To Lumbar Five Posterior Instrumented Fusion;  Surgeon: Cheryle Debby LABOR, MD;  Location: [facility] OR;  Service: Neurosurgery;  Laterality: N/A;   REFRACTIVE SURGERY Bilateral    SHOULDER ARTHROSCOPY Right    SHOULDER OPEN ROTATOR CUFF REPAIR Right    SPINAL FUSION  1996   t10 down to my coccyx   SPINE HARDWARE REMOVAL     TOTAL ABDOMINAL HYSTERECTOMY     TOTAL HIP ARTHROPLASTY Right 05/10/2014   [place]      by dr lonni olcott   TOTAL KNEE ARTHROPLASTY Left    TOTAL SHOULDER ARTHROPLASTY Left 08/19/2016    Procedure: TOTAL SHOULDER ARTHROPLASTY;  Surgeon: Eva Herring, MD;  Location: [facility] OR;  Service: Orthopedics;  Laterality: Left;  Left total shoulder replacement   Patient Active Problem List   Diagnosis Date Noted   COPD (chronic obstructive pulmonary disease) (HCC) 04/05/2024   Colovaginal fistula 11/30/2023   Diskitis 10/04/2023   Hypotension 09/06/2023   Fracture of tenth thoracic vertebra (HCC) 09/05/2023   History of discitis 09/05/2023   Senile osteoporosis 09/05/2023   History of urinary retention 09/05/2023   Vaginal atrophy 09/05/2023   Nocturia 09/05/2023   Vulvar ulcer 09/05/2023   Pulmonary nodule 08/02/2023   Non-traumatic compression fracture of vertebral column (HCC) 06/24/2023   Nonobstructive atherosclerosis of coronary artery 02/10/2023   Bilateral pseudophakia 02/03/2023   Vertebral fracture, osteoporotic (HCC) 01/06/2023   Ventriculitis of brain due to bacteria 11/02/2022   Anemia of chronic illness 10/12/2022   Bipolar disorder, in partial remission, most recent episode depressed (HCC) 10/07/2022   Adjustment disorder with mixed anxiety and depressed mood 09/30/2022   Chronic wound 09/25/2022   Hyperglycemia 09/25/2022   Chronic midline thoracic back pain 09/17/2022   Primary insomnia 09/17/2022   Chronic combined systolic and diastolic CHF (congestive heart failure) (HCC) 09/14/2022   Acute blood loss anemia 09/09/2022   Infection of lumbar spine (HCC) 09/03/2022   Malnutrition of moderate degree 08/20/2022   Hardware complicating wound infection 08/20/2022   Infection of deep incisional surgical site after procedure 08/20/2022   Aspiration pneumonia of both lungs (HCC) 08/19/2022   Acute respiratory failure with hypoxia (HCC) 08/19/2022   Sepsis (HCC) 08/19/2022   Seizure (HCC) 08/19/2022   Wound dehiscence 08/09/2022   Delayed surgical wound healing 07/08/2022   Fracture of lumbar spine without cord injury (HCC) 06/26/2022   Lumbar vertebral  fracture (HCC) 06/25/2022   Mood disorder 09/29/2021   Generalized muscle weakness 04/30/2019   Mild cognitive impairment 09/06/2018   Hyponatremia 02/28/2018   S/P shoulder replacement, left 08/19/2016   Anxiety 10/17/2014   Acid reflux 10/17/2014   BP (high blood pressure) 10/17/2014   Arthritis, degenerative 10/17/2014   Adult hypothyroidism 10/17/2014   UTI (urinary tract infection) 06/03/2014   Fracture of bone adjacent to prosthesis 06/03/2014   Peri-prosthetic fracture of femur following total hip arthroplasty 06/02/2014   Urinary retention 05/13/2014   Chronic pain 05/10/2014   History of hip surgery 05/10/2014   Osteoarthritis 09/30/2011   UNSPECIFIED HEART FAILURE 06/24/2010   UNSPECIFIED CONGENITAL DEFECT OF SEPTAL CLOSURE 06/24/2010   Secondary cardiomyopathy (HCC) 03/18/2010   Tobacco use 03/18/2010   DM 06/13/2009   HYPERTENSION, UNSPECIFIED 06/13/2009   VENTRICULAR TACHYCARDIA 06/13/2009   VENTRICULAR SEPTAL DEFECT, CONGENITAL 06/13/2009   Essential (primary) hypertension 06/13/2009   Diabetes mellitus type 2, noninsulin dependent (HCC) 06/13/2009    ONSET DATE: 01/05/2024 MD referral  REFERRING  DIAG: R26.9 (ICD-10-CM) - Gait difficulty   THERAPY DIAG:  Unsteadiness on feet  Other abnormalities of gait and mobility  Muscle weakness (generalized)  Rationale for Evaluation and Treatment: Rehabilitation  SUBJECTIVE:                                                                                                                                                                                             SUBJECTIVE STATEMENT: Doing well, reports arms were sore last session but she felt like she had good muscle soreness not a lot of joint pain after last session.    Pt accompanied by: self  PERTINENT HISTORY: Chronic osteomyelitis with worsening back pain; Hx of multiple R hip fractures with surgery to revisions, significant leg length difference with LLE longer  than RLE, hx of infection post surgery in spine (treated on antibiotics) Per MD Note 01/09/2024:    Patient was seen by Dr. Elspeth Schultze who scheduled her for surgery and on  12/01/2023 she underwent   -ROBOTIC LOW ANTERIOR RECTOSIGMOID RESECTION (LAR) WITH ANASTOMOSIS -COLOVAGINAL FISTULA TAKEDOWN & REPAIR -OMENTOPEXY OF VAGINA -INTRAOPERATIVE ASSESSMENT OF TISSUE VASCULAR PERFUSION USING ICG (indocyanine green ) IMMUNOFLUORESCENCE -TRANSVERSUS ABDOMINIS PLANE (TAP) BLOCK - BILATERAL -FLEXIBLE SIGMOIDOSCOPY    Urology did joint surgery and did    1.  Cystoscopy with bilateral ureteral FireFly injections 2.  Transurethral resection of  possible bladder tumor--large   Pathology came back with:   A. BLADDER TUMOR, TRANSURETHERAL RESECTON:  Florid cystitis cystica and papillary cystitis  Negative for carcinoma   B. RECTOSIGMOID COLON RESECTION:  Diverticula with chronic inflammation, perforation, fistula and reactive  fibrosis  Negative for carcinoma  Five benign lymph nodes (0/5)   C. FINAL DISTAL MARGIN:  Benign colonic tissue         PAIN:  Are you having pain? Yes: NPRS scale: 5/10 Pain location: R hip/back Pain description: throbbing, grinding, sharp Aggravating factors: more activities, [place] Relieving factors: rest  PRECAUTIONS: Fall and Other: Recent colectomy-pt reports no restrictions Pt has history of osteoporosis and compression fractures RED FLAGS: None   WEIGHT BEARING RESTRICTIONS: No  FALLS: Has patient fallen in last 6 months? No  LIVING ENVIRONMENT: Lives with: lives with their family Lives in: House/apartment Stairs: long ramp in/out of home Has following equipment at home: Vannie - 2 wheeled and Wheelchair (manual)  PLOF: Needs assistance with gait and Vocation/Vocational requirements: retired runner, broadcasting/film/video, tree surgeon; enjoys spending time with grandchildren in [place] and goes to [employer] weekly  PATIENT GOALS: To get  stronger, to get back on a cane.  To use walker to get  to the car (long ramp), get RLE into car better  OBJECTIVE:   TODAY'S TREATMENT: 05/15/24  Aquatic therapy at [facility] - pool temperature 92 degrees   Patient seen for aquatic therapy today.  Treatment took place in water  3.6-4.8 feet deep depending upon activity.  Patient entered and exited the pool via stairs using step to pattern w/ Bil rails at SBA level.   Exercises: -Water  walking warmup 4x18 ft forward > backward > 4x18 laterally unsupported SBA, added holding multi colored barbells by side while side stepping for 4 laps.  Tolerated well -Forward marching holding yellow barbells x 4 laps with cues for posture and slower movement.   -Standing in staggered stance, performing ant/post weight shifts without UE involvement x 5 reps>moving arms back (squeezing shoulder blades) when shifting post and then moving arms together in front of her with ant weight shift.  Performed x 10 reps each side total.  -For break from standing and to avoid increased soreness, had pt sit on bench with small step under feet for support, performing alt LAQ x 10 reps with 3.5 lb ankle weights  -Seated straight leg hip abd/add x 15 reps with ankle weights with light UE support on wall.  -Seated straight leg flutter kicks x 20 reps (2 sets) with ankle weights with light UE support to further engage core mm.   -Sit<>stand from bench with step under feet x 10 reps with PT holding yellow kick board under RLE to even out WB during transitional movement which seemed to work very well.  Cues for slow controlled descent.   -Ended with forward walking x 2 more laps with barbells for support.       Patient requires buoyancy of the water  for support for reduced fall risk with gait training and balance exercises with SBA support and verbal cuing and return demonstration. Exercises able to be performed safely in water  without the risk of fall compared to those same  exercises performed on land; viscosity of water  needed for resistance for strengthening. Current of water  provides perturbations for challenging static and dynamic balance.        Access Code: [email or web address] URL: [email or web address] Date: 03/27/2024 Prepared by: [facility] - Outpatient  Rehab - [facility]  Program Notes Seated leg slides out and in;  have a pillow case under your right foot-slide your foot out and in, 3 x 10 reps.  You can have a 3-5# ankle weight on your ankle  Exercises - Seated Hip Adduction Isometrics with Ball  - 1 x daily - 7 x weekly - 3 sets - 10 reps - Seated Hip Abduction with Resistance  - 1 x daily - 7 x weekly - 3 sets - 10 reps - Sit to Stand with Resistance Around Legs  - 1 x daily - 7 x weekly - 3 sets - 5 reps - Seated Shoulder Row with Anchored Resistance  - 1 x daily - 5 x weekly - 2 sets - 10 reps - Seated Anti-Rotation Press With Anchored Resistance  - 1 x daily - 7 x weekly - 3 sets - 10 reps - Seated Hamstring Curl with Anchored Resistance  - 1 x daily - 4 x weekly - 3 sets - 10 reps - Supine Quad Set  - 1 x daily - 5 x weekly - 3 sets - 10 reps - 3 sec hold - Short Arc Quad with Ankle Weight  - 1 x daily - 4 x weekly - 3 sets - 10  reps  PATIENT EDUCATION: Education details: aquatic rationale  Person educated: Patient Education method: Explanation, Demonstration, and Handouts Education comprehension: verbalized understanding and returned demonstration        --------------------------------------------------- Note: Objective measures were completed at Evaluation unless otherwise noted.  DIAGNOSTIC FINDINGS: MRI 09/2023   IMPRESSION: 1. Postoperative changes of T11-S1 laminectomy and posterior spinal fusion with right posterior spinal instrumentation from L2-L5. On prior CT, there is evidence of fracture through the left posterior fusion mass at L4-5, correlating with edema is seen in the left pedicle of L4 on today's  study. 2. Decreased edema within the L4 and L5 vertebral bodies and posterior elements, as well as within the L4-5 and L3-4 discs, likely reactive and related to the above fracture. 3. Loculated simple fluid within the thecal sac with some marginalized cauda equina nerve roots, most consistent with arachnoiditis. 4. No spinal canal stenosis or neural foraminal narrowing.  COGNITION: Overall cognitive status: History of cognitive impairments - at baseline   SENSATION: Light touch: Impaired  and to light touch-see below Reports some numbness L foot  POSTURE: Leg length diffierence  LOWER EXTREMITY ROM:     Active  Right Eval Left Eval  Hip flexion    Hip extension    Hip abduction    Hip adduction    Hip internal rotation    Hip external rotation    Knee flexion    Knee extension    Ankle dorsiflexion    Ankle plantarflexion    Ankle inversion    Ankle eversion     (Blank rows = not tested)  LOWER EXTREMITY MMT:    MMT Right Eval Left Eval  Hip flexion 3- 3+  Hip extension    Hip abduction 4 4  Hip adduction 4 4  Hip internal rotation    Hip external rotation    Knee flexion 4 4  Knee extension 3+ 4  Ankle dorsiflexion 3+ 3+  Ankle plantarflexion    Ankle inversion    Ankle eversion    (Blank rows = not tested)   TRANSFERS: Sit to stand: SBA  Assistive device utilized: Environmental Consultant - 2 wheeled     Stand to sit: SBA  Assistive device utilized: Environmental Consultant - 2 wheeled      GAIT: Findings: Gait Characteristics: excess lateral weightshift through trunk due to significant leg length difference and step through pattern, Distance walked: 50 ft, Assistive device utilized:Walker - 2 wheeled, Level of assistance: CGA, and Comments: NA  FUNCTIONAL TESTS:  5 times sit to stand: 14.44 sec with BUE support Timed up and go (TUG): 24.53 sec 2 minute walk test: NT 10 meter walk test: 21.34 sec (1.54 ft/sec) TUG cognitive: (naming colors) 28.62 sec 40M walk back:  42.16 sec with  RW Stroop gait:  13.25 sec in 10 ft (naming color words)-0.75 ft/sec   18.75 sec in 10 feet (naming printed colors)-0.53 ft/sec                                                                          PATIENT EDUCATION: Education details: See above. Person educated: Patient Education method: Explanation Education comprehension: verbalized understanding  HOME EXERCISE PROGRAM: Not yet initiated Did perform eccentric hip flexion/extension in  sitting  GOALS: Goals reviewed with patient? Yes  SHORT TERM GOALS: Target date: 02/24/2024  Pt will be independent with HEP for improved strength, balance, gait. Baseline: printed out and reviewed 02/23/24 Goal status: MET 02/23/24  2.  Pt will improve TUG score to less than or equal to 18 sec for decreased fall risk. Baseline: 24.53 sec; 14.54 sec with 4WW 02/23/24 Goal status: MET 02/23/24  3.  Pt will improve 3 M Walk backwards to less than or equal to 30 seconds for decreased fall risk. Baseline: 42.16 sec; 9.96 sec 02/23/24 Goal status: MET 02/23/24  4.  Pt will report at least 50% improvement in car transfers.  Baseline:  difficulty with RLE; Patient reports that she has been able to swing her LEs into the car by herself- reports 50% improvement. 02/23/24  Goal status:  MET 02/23/24  LONG TERM GOALS: Target date: 06/07/2024    Pt will be independent with HEP for improved strength, balance, gait. Baseline:  Goal status: IN PROGRESS  2.  Pt will improve gait velocity to at least 1.8 ft/sec for improved gait efficiency and safety. Baseline: 1.54 ft/sec; 1.8 ft/sec w/ 5TT Goal status: MET  3.  Pt will improve gait velocity with dual task to at least 1 ft/sec for improved gait and decreased fall risk. Baseline: 0.53-0.75 ft/sec with Stroop over 10 ft Goal status: IN PROGRESS  4.  Pt will improve 5x sit<>stand to less than or equal to 11.5 sec with light UE support to demonstrate improved functional strength and transfer efficiency. Baseline:  14.44 sec; 13.9 sec Goal status: IN PROGRESS 04/19/24  5.  Pt will verbalize plans for continued community fitness upon d/c from PT to maximize gains made in PT. Baseline:  Goal status: IN PROGRESS  6.  Pt will ambulate at least 200 ft in 2 minute walk, for improved gait efficiency and endurance.  Baseline: 100 ft in 1:30; 215 ft w/ 4WW x 2 min  Goal status:  MET   7.  Demo/report ability to negotiate ramp at her home w/ RW and load/unload AD into care to enable ability to enter/leave home  Baseline: unable  Goal status: INITIAL  8.  Demo ability to ambulate up to 300 m (984 ft) for community-level ambulation w/ AD  Baseline: 215 ft w/ 5TT  Goal status: INITIAL  ASSESSMENT:  CLINICAL IMPRESSION: Continue to focus on breaking session up with standing and seated tasks to reduce pain and soreness.    OBJECTIVE IMPAIRMENTS: Abnormal gait, decreased balance, decreased mobility, difficulty walking, decreased strength, impaired sensation, postural dysfunction, and pain.   ACTIVITY LIMITATIONS: sitting, standing, transfers, locomotion level, and caring for others  PARTICIPATION LIMITATIONS: driving, shopping, community activity, and travel  PERSONAL FACTORS: 3+ comorbidities: see PMH above are also affecting patient's functional outcome.   REHAB POTENTIAL: Good  CLINICAL DECISION MAKING: Evolving/moderate complexity  EVALUATION COMPLEXITY: Moderate  PLAN:  PT FREQUENCY: 2x/week  PT DURATION: 6 weeks   PLANNED INTERVENTIONS: [id number]- PT Re-evaluation, 97750- Physical Performance Testing, 97110-Therapeutic exercises, 97530- Therapeutic activity, [id number]- Neuromuscular re-education, 97535- Self Care, [id number]- Manual therapy, [id number]- Gait training, [phone]- Canalith repositioning, [id number]- Aquatic Therapy, Patient/Family education, and Balance training  PLAN FOR NEXT SESSION: .  Ask about back pain and progress standing and gait tolerance; RLE strength for car transfers; pt requests not to  perform Nustep and physioball activities d/t hip pain; pt reports that she would like to work towards being able to walk down her ramp and into her car-work  on outdoor sidewalk distance to simulate ramp and lifting/loading AD in/out of car  AQUATICS: Frequency: 1-2x/week Duration: 6 weeks  Special Instruction: chronic hip and back pain, imbalance   Aquatics:  continue core work - try ankle weights/aquatic cuffs in sitting  Damien Fought, PT, MPT [facility] [address] [place], [place], [address] Phone: [phone]   Fax:  [phone] 05/15/24, 8:24 AM

## 2024-05-17 ENCOUNTER — Encounter: Attending: Physical Medicine & Rehabilitation | Admitting: Physical Medicine & Rehabilitation

## 2024-05-17 ENCOUNTER — Encounter: Payer: Self-pay | Admitting: Physical Medicine & Rehabilitation

## 2024-05-17 ENCOUNTER — Ambulatory Visit: Admitting: Physical Therapy

## 2024-05-17 ENCOUNTER — Encounter: Payer: Self-pay | Admitting: Physical Therapy

## 2024-05-17 VITALS — BP 143/85 | HR 78 | Ht 60.0 in | Wt 150.0 lb

## 2024-05-17 DIAGNOSIS — F5101 Primary insomnia: Secondary | ICD-10-CM | POA: Insufficient documentation

## 2024-05-17 DIAGNOSIS — G8929 Other chronic pain: Secondary | ICD-10-CM | POA: Insufficient documentation

## 2024-05-17 DIAGNOSIS — M6281 Muscle weakness (generalized): Secondary | ICD-10-CM

## 2024-05-17 DIAGNOSIS — R2689 Other abnormalities of gait and mobility: Secondary | ICD-10-CM

## 2024-05-17 DIAGNOSIS — M546 Pain in thoracic spine: Secondary | ICD-10-CM | POA: Diagnosis present

## 2024-05-17 DIAGNOSIS — M4626 Osteomyelitis of vertebra, lumbar region: Secondary | ICD-10-CM | POA: Insufficient documentation

## 2024-05-17 DIAGNOSIS — R2681 Unsteadiness on feet: Secondary | ICD-10-CM

## 2024-05-17 NOTE — Progress Notes (Signed)
 Subjective:    Patient ID: [name], female    DOB: 06/17/1956, 68 y.o.   MRN: [id number]  HPI  HPI   Excerpt from discharge summary 10/13/2022 Brief HPI:   [name] is a 68 y.o. female with history of PVT, T2DM, bipolar d/o, mild cognitive impairments, right femur fracture with RLE leg length discrepancy and gait disorder, fall 05/2022 with 3 column unstable fracture through prior scoliosis construct s/p instrumentation with exposed hardware and wound dehiscence. She was admitted on 08/09/22 for revision of lumbar wound with removal of hardware and revision L1-L5 fusion by Dr. Cheryle. Post op course was significant for delirium with fevers, PNA with respiratory failure requiring intubation 2/01. MRI brain showed evidence of ventriculitis and MRI spine showed widely patent fusion with decrease in bone marrow edema L3.    Dr. Fleeta Rothman recommended IV antibiotics X 6 weeks empirically with flagyl  to cover aspiration PNA. She required tracheostomy as well as foley for urinary retention and rectal tube due to frequent loose stools and to prevent wound contamination. WOC was following for wound VAC changes. She was downsized to CFS#4 prior prior to d/c. PT/OT was working with patient who was limited by weakness, cognitive deficits requiring increased time for processing as well as verbal and tactile cues to follow simple commands as well as max to total assist with ADLs and mobility. CIR was recommended due to functional decline.       Hospital Course: [name] was admitted to rehab 09/03/2022 for inpatient therapies to consist of PT, ST and OT at least three hours five days a week. Past admission physiatrist, therapy team and rehab RN have worked together to provide customized collaborative inpatient rehab. Her blood pressures were monitored on TID basis and have been stable. As mentation and swallow function improved, diet was advanced to regular textures. She was  tolerating PMSV and was decannulated without difficulty. Her diabetes has been monitored with ac/hs CBG checks and was being managed with SSI due to variable intake and was reasonably controlled. As swallow function improved, she was advanced to regular textures, intake has improved with family providing food from home. She did request resumption of metformin  and d/c of CBG checks. She is tolerating metformin  without SE except for am nausea which could be due to transition to oral antibiotics. Patient has been educated on importance of BS management to promote wound healing and recommended to  monitor this post d/c. Her CBG checks d/c as BS controlled and have been monitored weekly with weekly labs.    She was maintained on Vanc/cefepime  thru 03/14 and as follow up MRI brain 03/13 showed  resolution of ventriculitis, she was transitioned to Levaquin  and doxycycline  bid per ID input. She is to continue on oral antibiotics X 4 weeks and follow up at [facility] on 04/16 for final decision on duration of antibiotic treatment. WOC has been following to give input on patient's wound care and for monitoring. Wound VAC was discontinued on 02/21 and wet to dry dressing changes with Aquacel Ag was initiated. She did develop some slough on wound bed and metahoney added initially but changed to santyl  which has helped to debride it. Patient did report that she plans  on showering at home and WOC recommended changing back to Aquacel for antibacterial protection.    Ensure and protein supplements added to help promote wound healing as as mentation improved but she had declined these frequently.  Voiding trial was attempted with  increase in tamsulosin  as well as addition of urecholine . She continued to require I/O caths and patient elected on  foley with plans for voiding trial on outpatient basis.  She has had high levels of anxiety and ego support has been provided by rehab team. Her mentation has improved with cognition trending  back to baseline. She expressed concerns about her psychiatric medication regimen as multiple meds has been d/c while on acute and requested psychiatry input. Her home Wellbutrin  was resumed on 03/21 and Dr. Fredia who evaluated patient felt that patient was at her baseline and no additional meds were recommended.    Pain control is improving and she has been educated on importance of weaning narcotics gradually. She continues Butran's patch as well as low dose gabapentin  as sedation was reported with attempts at up titration of latter.   She started making progress and LOS was extended by a week to help achieve higher goals. She requires min assist with ADL and mobility. She will continue to receive follow up HHPT, HHOT, HHST, HHaide and HHRN by [facility] after discharge.      Rehab course: During patient's stay in rehab weekly team conferences were held to monitor patient's progress, set goals and discuss barriers to discharge. At admission, patient required +2 total assist with ADL tasks and mod to total assist with pregait activity. She exhibited deficits in recall, attention, insight and required min to mod assist for processing and attention to tasks. She  has had improvement in activity tolerance, balance, postural control as well as ability to compensate for deficits. She has had improvement in functional use RUE and RLE as well as improvement in awareness. She requires min assist with ADL tasks.  She requires CGA for transfers and to ambulate 42 feet with RW and cues for sequencing.  She is able to complete functional cognitive tasks with min assist and requires min cues to self correct errors and for re-direction to tasks. Family education has been completed and family has hired caregivers to assist with care.     Interval history 12/10/2022 [name] is here for follow-up after CIR and above treatment.  She was also seen by Fidela Ned on 11/09/2022.  She reports she is continue to  work with physical therapy however she was discharged by OT.  She says after several weeks she may be able to transition to outpatient therapy.  Foley has been removed and she is voiding on her own, this does require to get up more frequently.  Her back pain is currently controlled with gabapentin , buprenorphine  that was increased to 10 mcg/h patch and as needed oxycodone .  She has been seen by Dr. Fleeta Rothman infectious disease and is continued on antibiotics of doxycycline  and Levaquin .  She continues to follow-up with Dr. Rockney.  She had a CT of her T-spine and L-spine recently that showed a new compression fracture.   Interval history 03/15/2023 [name] is here for follow-up regarding conditions listed above.  She reports she is doing well overall.  She continues to work with physical therapy and Occupational Therapy and feels like she is making good progress.  She says she is discharged from her wound care clinic because her lower back wound is now closed.  She is no longer on tramadol , buprenorphine  or oxycodone  and is now on Flexeril  for her back pain.  She feels like therapy is causing some soreness in her back.  Pain does not shoot down her legs.  Reports  she has a TENS and this provides minimal benefit.   Interval history 06/30/23 Patient is here for follow-up.  She reports she was doing very well with outpatient PT and OT and making good progress.  She later developed worsening thoracic pain and was found to have a T10 compression fracture.  She reports Dr. Rockney has retired but she is planning to follow-up with Dr. Carollee. She has been treated with Norco 5.  She reports she would like to restart PT after she has her potential kyphoplasty.  Interval history 10/20/23 Patient reports that she continues to have pain and issues with her lower back.  She continues to follow-up with ID for vertebral osteomyelitis.  She remains on IV antibiotics but it sounds like infectious disease planning to  switch her to oral medications.  She also reports rectovaginal fistula.  Patient been seen by Dr. Almarie Molt with neurosurgery at [facility].  She reports surgeons do not want to do any additional operations due to spinal infection.  She continues to have severe lower back pain.  Pain overall has been controlled with Norco 5 mg and buprenorphine  10 mcg/h.  This she also takes gabapentin  100 mg 3 times daily.  Interval history 02/13/24 Patient has been going to neuro PT, reports this has been beneficial to her mobility and function.  Continues on antibiotics for osteomyelitis/discitis, doxycycline  followed by ID.  Reports she has been careful with photosensitivity.  Reports she spoke with a different provider where options of  gummy type (CBD vs THC?) products versus other pain medicines were discussed.  She tried some Gummies and found them to be helpful and has been using them intermittently.    Interval History 05/17/24 Reports continued benefit from Journavx  for acute pain exacerbations. Not taking it daily,  but for occasional severe pain/acute pain exacerbations. Reports it allows for improved function. Reviewed again its for short-term use, not chronic daily use.   Hardware is palpable and painful on the back, especially with sitting or lying down.  Cannot have revision surgery due to infection.   Physical Therapy Currently doing PT at [facility], including aquatics at the [place] facility. Finds it helpful for pain and strength. Nearing the end of the current course of PT. Interested in transitioning to a systems analyst at the [facility] facility ([facility]) for one-on-one sessions to maintain progress. A letter of medical necessity for a heated pool was provided to potentially obtain a discount on a gym membership.  Insomnia Reports chronic insomnia, averaging 4 hours of sleep per night. States lack of sleep exacerbates depression and pain. - Current sleep aids: Trazodone  (not very  effective), Vylar. Also takes Flexeril  TID for muscle pain. - Sleep hygiene review: - Avoids phone use in bed. - Watches TV at night. - Consumes diet Coke, sometimes in the afternoon. - Advised to avoid caffeine  after noon and to consider caffeine -free options. - Advised to use blue light filtering glasses or a screen filter if watching TV at night, avoid bright light if possible - Discussed the importance of a cool sleeping environment.    Pain Inventory Average Pain 5 Pain Right Now 4 My pain is constant, sharp, burning, and aching  In the last 24 hours, has pain interfered with the following? General activity 9 Relation with others 3 Enjoyment of life 3 What TIME of day is your pain at its worst? morning , daytime, and evening Sleep (in general) Poor  Pain is worse with: walking, bending, and standing Pain improves with:  heat/ice and medication Relief from Meds: 8  Family History  Problem Relation Age of Onset   Other Mother        alive   Stroke Father 68       deceased   Dementia Father    Chorea Maternal Grandfather    Dementia Maternal Aunt    Dementia Maternal Aunt    Heart attack Other        multiple uncles have died with myocardial infarction   Bladder Cancer Neg Hx    Uterine cancer Neg Hx    Social History   Socioeconomic History   Marital status: Married    Spouse name: Not on file   Number of children: Not on file   Years of education: Not on file   Highest education level: Master's degree (e.g., MA, MS, MEng, MEd, MSW, MBA)  Occupational History   Occupation: Magazine Features Editor: SELF-EMPLOYED    Comment: former  Tobacco Use   Smoking status: Every Day    Types: Cigarettes    Passive exposure: Never   Smokeless tobacco: Never   Tobacco comments:    03/30/21 smokes 2 cigs daily.      08/06/22 GLENWOOD Raisin stated that the patient is not smoking while at [facility] for rehab. KM    11-23-23 has been quit for 3 weeks  Vaping Use   Vaping status:  Never Used  Substance and Sexual Activity   Alcohol use: No   Drug use: No   Sexual activity: Not Currently    Partners: Male    Birth control/protection: Surgical    Comment: Hysterectomy  Other Topics Concern   Not on file  Social History Narrative   Right handed   Caffeine  2-3 cups daily    Lives at home with husband    Social Drivers of Health   Financial Resource Strain: Low Risk  (07/08/2023)   [facility]   Overall Financial Resource Strain (CARDIA)    Difficulty of Paying Living Expenses: Not hard at all  Food Insecurity: No Food Insecurity (11/30/2023)   Hunger Vital Sign    Worried About Running Out of Food in the Last Year: Never true    Ran Out of Food in the Last Year: Never true  Transportation Needs: No Transportation Needs (11/30/2023)   PRAPARE - Administrator, Civil Service (Medical): No    Lack of Transportation (Non-Medical): No  Physical Activity: Not on file  Stress: Not on file  Social Connections: Socially Integrated (11/30/2023)   Social Connection and Isolation Panel    Frequency of Communication with Friends and Family: Twice a week    Frequency of Social Gatherings with Friends and Family: Twice a week    Attends Religious Services: 1 to 4 times per year    Active Member of Clubs or Organizations: No    Attends Engineer, Structural: 1 to 4 times per year    Marital Status: Married   Past Surgical History:  Procedure Laterality Date   ABDOMINAL HYSTERECTOMY     BACK SURGERY     CARDIAC CATHETERIZATION N/A 06/23/2015   Procedure: Left Heart Cath and Coronary Angiography;  Surgeon: Ezra GORMAN Shuck, MD;  Location: [facility] INVASIVE CV LAB;  Service: Cardiovascular;  Laterality: N/A;   CARDIAC CATHETERIZATION  1960   VSD was so small; didn't need repaired   COLECTOMY, SIGMOID, ROBOT-ASSISTED N/A 11/30/2023   Procedure: COLECTOMY, SIGMOID, ROBOT-ASSISTED ROBOTIC LOW ANTERIOR  RECTOSIGMOID RESECTION  (LAR) WITH ANASTOMOSIS COLOVAGINAL FISTULA TAKEDOWN & REPAIR OMENTOPEXY OF VAGINA INTRAOPERATIVE ASSESSMENT OF TISSUE VASCULAR PERFUSION USING ICG (indocyanine green ) IMMUNOFLUORESCENCE TRANSVERSUS ABDOMINIS PLANE (TAP) BLOCK - BILATERAL;  Surgeon: Sheldon Standing, MD;  Location: [facility] ORS;  Service: General;  Late   CYSTOSCOPY WITH INDOCYANINE GREEN  IMAGING (ICG) N/A 11/30/2023   Procedure: Cystoscopy with bilateral ureteral FireFly injections Transurethral resection of bladder tumor--large;  Surgeon: Carolee Sherwood JONETTA DOUGLAS, MD;  Location: [facility] ORS;  Service: Urology;  Laterality: N/A;   EXAM UNDER ANESTHESIA WITH MANIPULATION OF HIP Right 06/02/2014   dr sheril   FLEXIBLE SIGMOIDOSCOPY N/A 11/30/2023   Procedure: KINGSTON SIDE;  Surgeon: Sheldon Standing, MD;  Location: [facility] OR[facility];  Service: General;  Laterality: N/A;   FRACTURE SURGERY     HERNIA REPAIR     HIP CLOSED REDUCTION Right 06/02/2014   Procedure: CLOSED MANIPULATION HIP;  Surgeon: Maude KANDICE Sheril, MD;  Location: [facility] OR;  Service: Orthopedics;  Laterality: Right;   IR FLUORO GUIDED NEEDLE PLC ASPIRATION/INJECTION LOC  08/09/2023   JOINT REPLACEMENT     JOINT REPLACEMENT     POSTERIOR LUMBAR FUSION 4 LEVEL N/A 06/26/2022   Procedure: Lumbar One To Lumbar Five Posterior Instrumented Fusion;  Surgeon: Cheryle Debby LABOR, MD;  Location: [facility] OR;  Service: Neurosurgery;  Laterality: N/A;   REFRACTIVE SURGERY Bilateral    SHOULDER ARTHROSCOPY Right    SHOULDER OPEN ROTATOR CUFF REPAIR Right    SPINAL FUSION  1996   t10 down to my coccyx   SPINE HARDWARE REMOVAL     TOTAL ABDOMINAL HYSTERECTOMY     TOTAL HIP ARTHROPLASTY Right 05/10/2014   [place]      by dr lonni olcott   TOTAL KNEE ARTHROPLASTY Left    TOTAL SHOULDER ARTHROPLASTY Left 08/19/2016   Procedure: TOTAL SHOULDER ARTHROPLASTY;  Surgeon: Eva Herring, MD;  Location: [facility] OR;  Service: Orthopedics;  Laterality: Left;  Left total shoulder replacement   Past Surgical  History:  Procedure Laterality Date   ABDOMINAL HYSTERECTOMY     BACK SURGERY     CARDIAC CATHETERIZATION N/A 06/23/2015   Procedure: Left Heart Cath and Coronary Angiography;  Surgeon: Ezra GORMAN Shuck, MD;  Location: [facility] INVASIVE CV LAB;  Service: Cardiovascular;  Laterality: N/A;   CARDIAC CATHETERIZATION  1960   VSD was so small; didn't need repaired   COLECTOMY, SIGMOID, ROBOT-ASSISTED N/A 11/30/2023   Procedure: COLECTOMY, SIGMOID, ROBOT-ASSISTED ROBOTIC LOW ANTERIOR RECTOSIGMOID RESECTION (LAR) WITH ANASTOMOSIS COLOVAGINAL FISTULA TAKEDOWN & REPAIR OMENTOPEXY OF VAGINA INTRAOPERATIVE ASSESSMENT OF TISSUE VASCULAR PERFUSION USING ICG (indocyanine green ) IMMUNOFLUORESCENCE TRANSVERSUS ABDOMINIS PLANE (TAP) BLOCK - BILATERAL;  Surgeon: Sheldon Standing, MD;  Location: [facility];  Service: General;  Late   CYSTOSCOPY WITH INDOCYANINE GREEN  IMAGING (ICG) N/A 11/30/2023   Procedure: Cystoscopy with bilateral ureteral FireFly injections Transurethral resection of bladder tumor--large;  Surgeon: Carolee Sherwood JONETTA DOUGLAS, MD;  Location: [facility];  Service: Urology;  Laterality: N/A;   EXAM UNDER ANESTHESIA WITH MANIPULATION OF HIP Right 06/02/2014   dr sheril   FLEXIBLE SIGMOIDOSCOPY N/A 11/30/2023   Procedure: KINGSTON SIDE;  Surgeon: Sheldon Standing, MD;  Location: [facility];  Service: General;  Laterality: N/A;   FRACTURE SURGERY     HERNIA REPAIR     HIP CLOSED REDUCTION Right 06/02/2014   Procedure: CLOSED MANIPULATION HIP;  Surgeon: Maude KANDICE Sheril, MD;  Location: [facility];  Service: Orthopedics;  Laterality: Right;   IR FLUORO GUIDED NEEDLE PLC ASPIRATION/INJECTION LOC  08/09/2023  JOINT REPLACEMENT     JOINT REPLACEMENT     POSTERIOR LUMBAR FUSION 4 LEVEL N/A 06/26/2022   Procedure: Lumbar One To Lumbar Five Posterior Instrumented Fusion;  Surgeon: Cheryle Debby LABOR, MD;  Location: [facility] OR;  Service: Neurosurgery;  Laterality: N/A;   REFRACTIVE SURGERY Bilateral    SHOULDER ARTHROSCOPY Right     SHOULDER OPEN ROTATOR CUFF REPAIR Right    SPINAL FUSION  1996   t10 down to my coccyx   SPINE HARDWARE REMOVAL     TOTAL ABDOMINAL HYSTERECTOMY     TOTAL HIP ARTHROPLASTY Right 05/10/2014   [place]      by dr lonni olcott   TOTAL KNEE ARTHROPLASTY Left    TOTAL SHOULDER ARTHROPLASTY Left 08/19/2016   Procedure: TOTAL SHOULDER ARTHROPLASTY;  Surgeon: Eva Herring, MD;  Location: [facility] OR;  Service: Orthopedics;  Laterality: Left;  Left total shoulder replacement   Past Medical History:  Diagnosis Date   Anemia    Anxiety    Arthritis    Bipolar disorder (HCC)    Cardiomyopathy (HCC)    Complication of anesthesia    Awake but could not move   Depression    Diskitis 10/04/2023   GERD (gastroesophageal reflux disease)    Heart failure (HCC)    Acute   Heart murmur    related to VSD   High cholesterol    History of blood transfusion    related to OR (08/19/2016)   History of hiatal hernia    Hypertension    Hyperthyroidism    Hypotension 09/06/2023   Mild cognitive impairment 09/06/2018   Paroxysmal ventricular tachycardia (HCC)    Pneumonia    Rectovaginal fistula 09/06/2023   Tachycardia, unspecified    Type II diabetes mellitus (HCC)    UTI (urinary tract infection)    being treated with Keflex    Ventricular septal defect    Ventriculitis of brain due to bacteria 11/02/2022   Vertebral fracture, osteoporotic (HCC) 01/06/2023   BP (!) 143/85   Pulse 78   Ht 5' (1.524 m)   Wt 150 lb (68 kg)   SpO2 98%   BMI 29.29 kg/m   Opioid Risk Score:   Fall Risk Score:  `1  Depression screen [facility] 2/9     02/13/2024   11:27 AM 10/20/2023   12:20 PM 09/19/2023   10:18 AM 09/07/2023    9:09 AM 06/30/2023   11:55 AM 03/15/2023   10:54 AM 01/06/2023   10:36 AM  Depression screen PHQ 2/9  Decreased Interest 0 2 0 1 1 1  0  Down, Depressed, Hopeless 0 2 1 0 1 1 1   PHQ - 2 Score 0 4 1 1 2 2 1   Altered sleeping  2       Tired, decreased energy  2       Change in  appetite  1       Feeling bad or failure about yourself   0       Trouble concentrating  0       Moving slowly or fidgety/restless  0       Suicidal thoughts  0       PHQ-9 Score  9          Review of Systems  Musculoskeletal:  Positive for back pain.       RT hip  All other systems reviewed and are negative.      Objective:   Physical Exam   Gen: no distress,  normal appearing HEENT: oral mucosa pink and moist, NCAT Chest: normal effort, normal rate of breathing Abd: soft, non-distended Ext: no edema Psych: pleasant, normal affect Skin: Warm dry Neuro: Alert and awake, follows commands, cranial 2-12 grossly intact Moving all 4 extremities to gravity Sensation intact light touch in all 4 extremities Musculoskeletal: TTP lower T spine and  L spine Hard palpable area on spine noted- pt says this is screw- followed by surgery team   CT lumbar spine 12/04/2022 IMPRESSION: 1. Pronounced chronic osteopenia. Prior decompression and fusion throughout the lumbar spine.   2. L3 superior endplate/superior body non-acute fracture AND subtle associated horizontal fracture through the chronic posterior element bone mass there (series 9, image 53 today). But no displacement over this series of exams.   3. Difficult to exclude nondisplaced S3/S4 sacral fracture, with mild presacral stranding there.   4. But no other acute osseous abnormality identified. And elsewhere the chronic T12 through sacral ankylosis/arthrodesis appears intact.   5. T10 compression fracture is detailed separately today on CT Thoracic Spine.   We discussed getting stay CT thoracic spine 12/04/2022 IMPRESSION: 1. T10 compression fracture with 12% loss of height is new since 08/19/2022. No retropulsion or other complicating features. Unchanged adjacent chronic T10-T11 and T12-L1 posterior element ankylosis, with chronic non fusion of T11-T12. 2. No other acute osseous abnormality identified in the  thoracic spine. Generalized osteopenia. Chronic mild T5, T6, and T11 compression fractures. 3. Increased elevation of the left hemidiaphragm and new left lung base consolidation since February. But small bilateral pleural effusions have resolved. 4. No CT evidence of thoracic spinal stenosis.   L spine MRI 10/07/23 IMPRESSION: 1. Postoperative changes of T11-S1 laminectomy and posterior spinal fusion with right posterior spinal instrumentation from L2-L5. On prior CT, there is evidence of fracture through the left posterior fusion mass at L4-5, correlating with edema is seen in the left pedicle of L4 on today's study. 2. Decreased edema within the L4 and L5 vertebral bodies and posterior elements, as well as within the L4-5 and L3-4 discs, likely reactive and related to the above fracture. 3. Loculated simple fluid within the thecal sac with some marginalized cauda equina nerve roots, most consistent with arachnoiditis. 4. No spinal canal stenosis or neural foraminal narrowing.       Assessment & Plan:   Osteomyelitis, infection of lumbar surgical site complicated by meningitis/ventriculitis.              -Continue to follow-up with NSGY as directed             -Continue to follow-up with infectious disease as directed  -Continue PT, I think personal trainer would be good idea after completion of PT  Chronic thoracic and lower back pain. Complex scoliosis   -History of T11-S1 laminectomy and posterior spinal fusion, noted to have fracture to the left posterior fusion mass at L4-5, patient has had several compression fractures             -She is previously on buprenorphine , oxycodone -review of PDMP indicates she has not been prescribed this since May  -Opiate risk tool moderate  - Patient is interested in Journavx -Reports very helpful for occasional acute pain exacerbations  -Journavx  discount card provided  Insomnia  - Implement sleep hygiene recommendations as above

## 2024-05-17 NOTE — Therapy (Signed)
 OUTPATIENT PHYSICAL THERAPY NEURO TREATMENT NOTE   Patient Name: [name] MRN: [id number] DOB:[birth year], 68 y.o., female Today's Date: 05/17/2024   PCP: Rolinda Millman, MD  REFERRING PROVIDER: Urbano Albright, MD        END OF SESSION:  PT End of Session - 05/17/24 1453     Visit Number 22    Number of Visits 31    Date for Recertification  06/07/24    Authorization Type BCBS Medicare    Progress Note Due on Visit 28    PT Start Time 1450    PT Stop Time 1530    PT Time Calculation (min) 40 min    Equipment Utilized During Treatment Other (comment)    Activity Tolerance Patient tolerated treatment well    Behavior During Therapy WFL for tasks assessed/performed                Past Medical History:  Diagnosis Date   Anemia    Anxiety    Arthritis    Bipolar disorder (HCC)    Cardiomyopathy (HCC)    Complication of anesthesia    Awake but could not move   Depression    Diskitis 10/04/2023   GERD (gastroesophageal reflux disease)    Heart failure (HCC)    Acute   Heart murmur    related to VSD   High cholesterol    History of blood transfusion    related to OR (08/19/2016)   History of hiatal hernia    Hypertension    Hyperthyroidism    Hypotension 09/06/2023   Mild cognitive impairment 09/06/2018   Paroxysmal ventricular tachycardia (HCC)    Pneumonia    Rectovaginal fistula 09/06/2023   Tachycardia, unspecified    Type II diabetes mellitus (HCC)    UTI (urinary tract infection)    being treated with Keflex    Ventricular septal defect    Ventriculitis of brain due to bacteria 11/02/2022   Vertebral fracture, osteoporotic (HCC) 01/06/2023   Past Surgical History:  Procedure Laterality Date   ABDOMINAL HYSTERECTOMY     BACK SURGERY     CARDIAC CATHETERIZATION N/A 06/23/2015   Procedure: Left Heart Cath and Coronary Angiography;  Surgeon: Ezra GORMAN Shuck, MD;  Location: [facility] INVASIVE CV LAB;  Service: Cardiovascular;   Laterality: N/A;   CARDIAC CATHETERIZATION  1960   VSD was so small; didn't need repaired   COLECTOMY, SIGMOID, ROBOT-ASSISTED N/A 11/30/2023   Procedure: COLECTOMY, SIGMOID, ROBOT-ASSISTED ROBOTIC LOW ANTERIOR RECTOSIGMOID RESECTION (LAR) WITH ANASTOMOSIS COLOVAGINAL FISTULA TAKEDOWN & REPAIR OMENTOPEXY OF VAGINA INTRAOPERATIVE ASSESSMENT OF TISSUE VASCULAR PERFUSION USING ICG (indocyanine green ) IMMUNOFLUORESCENCE TRANSVERSUS ABDOMINIS PLANE (TAP) BLOCK - BILATERAL;  Surgeon: Sheldon Standing, MD;  Location: [facility] ORS;  Service: General;  Late   CYSTOSCOPY WITH INDOCYANINE GREEN  IMAGING (ICG) N/A 11/30/2023   Procedure: Cystoscopy with bilateral ureteral FireFly injections Transurethral resection of bladder tumor--large;  Surgeon: Carolee Sherwood JONETTA DOUGLAS, MD;  Location: [facility];  Service: Urology;  Laterality: N/A;   EXAM UNDER ANESTHESIA WITH MANIPULATION OF HIP Right 06/02/2014   dr sheril   FLEXIBLE SIGMOIDOSCOPY N/A 11/30/2023   Procedure: KINGSTON SIDE;  Surgeon: Sheldon Standing, MD;  Location: [facility];  Service: General;  Laterality: N/A;   FRACTURE SURGERY     HERNIA REPAIR     HIP CLOSED REDUCTION Right 06/02/2014   Procedure: CLOSED MANIPULATION HIP;  Surgeon: Maude KANDICE Sheril, MD;  Location: [facility] OR;  Service: Orthopedics;  Laterality: Right;   IR FLUORO GUIDED NEEDLE PLC  ASPIRATION/INJECTION LOC  08/09/2023   JOINT REPLACEMENT     JOINT REPLACEMENT     POSTERIOR LUMBAR FUSION 4 LEVEL N/A 06/26/2022   Procedure: Lumbar One To Lumbar Five Posterior Instrumented Fusion;  Surgeon: Cheryle Debby LABOR, MD;  Location: [facility] OR;  Service: Neurosurgery;  Laterality: N/A;   REFRACTIVE SURGERY Bilateral    SHOULDER ARTHROSCOPY Right    SHOULDER OPEN ROTATOR CUFF REPAIR Right    SPINAL FUSION  1996   t10 down to my coccyx   SPINE HARDWARE REMOVAL     TOTAL ABDOMINAL HYSTERECTOMY     TOTAL HIP ARTHROPLASTY Right 05/10/2014   [place]      by dr lonni olcott   TOTAL KNEE ARTHROPLASTY  Left    TOTAL SHOULDER ARTHROPLASTY Left 08/19/2016   Procedure: TOTAL SHOULDER ARTHROPLASTY;  Surgeon: Eva Herring, MD;  Location: [facility] OR;  Service: Orthopedics;  Laterality: Left;  Left total shoulder replacement   Patient Active Problem List   Diagnosis Date Noted   COPD (chronic obstructive pulmonary disease) (HCC) 04/05/2024   Colovaginal fistula 11/30/2023   Diskitis 10/04/2023   Hypotension 09/06/2023   Fracture of tenth thoracic vertebra (HCC) 09/05/2023   History of discitis 09/05/2023   Senile osteoporosis 09/05/2023   History of urinary retention 09/05/2023   Vaginal atrophy 09/05/2023   Nocturia 09/05/2023   Vulvar ulcer 09/05/2023   Pulmonary nodule 08/02/2023   Non-traumatic compression fracture of vertebral column (HCC) 06/24/2023   Nonobstructive atherosclerosis of coronary artery 02/10/2023   Bilateral pseudophakia 02/03/2023   Vertebral fracture, osteoporotic (HCC) 01/06/2023   Ventriculitis of brain due to bacteria 11/02/2022   Anemia of chronic illness 10/12/2022   Bipolar disorder, in partial remission, most recent episode depressed (HCC) 10/07/2022   Adjustment disorder with mixed anxiety and depressed mood 09/30/2022   Chronic wound 09/25/2022   Hyperglycemia 09/25/2022   Chronic midline thoracic back pain 09/17/2022   Primary insomnia 09/17/2022   Chronic combined systolic and diastolic CHF (congestive heart failure) (HCC) 09/14/2022   Acute blood loss anemia 09/09/2022   Infection of lumbar spine (HCC) 09/03/2022   Malnutrition of moderate degree 08/20/2022   Hardware complicating wound infection 08/20/2022   Infection of deep incisional surgical site after procedure 08/20/2022   Aspiration pneumonia of both lungs (HCC) 08/19/2022   Acute respiratory failure with hypoxia (HCC) 08/19/2022   Sepsis (HCC) 08/19/2022   Seizure (HCC) 08/19/2022   Wound dehiscence 08/09/2022   Delayed surgical wound healing 07/08/2022   Fracture of lumbar spine without  cord injury (HCC) 06/26/2022   Lumbar vertebral fracture (HCC) 06/25/2022   Mood disorder 09/29/2021   Generalized muscle weakness 04/30/2019   Mild cognitive impairment 09/06/2018   Hyponatremia 02/28/2018   S/P shoulder replacement, left 08/19/2016   Anxiety 10/17/2014   Acid reflux 10/17/2014   BP (high blood pressure) 10/17/2014   Arthritis, degenerative 10/17/2014   Adult hypothyroidism 10/17/2014   UTI (urinary tract infection) 06/03/2014   Fracture of bone adjacent to prosthesis 06/03/2014   Peri-prosthetic fracture of femur following total hip arthroplasty 06/02/2014   Urinary retention 05/13/2014   Chronic pain 05/10/2014   History of hip surgery 05/10/2014   Osteoarthritis 09/30/2011   UNSPECIFIED HEART FAILURE 06/24/2010   UNSPECIFIED CONGENITAL DEFECT OF SEPTAL CLOSURE 06/24/2010   Secondary cardiomyopathy (HCC) 03/18/2010   Tobacco use 03/18/2010   DM 06/13/2009   HYPERTENSION, UNSPECIFIED 06/13/2009   VENTRICULAR TACHYCARDIA 06/13/2009   VENTRICULAR SEPTAL DEFECT, CONGENITAL 06/13/2009   Essential (primary) hypertension 06/13/2009  Diabetes mellitus type 2, noninsulin dependent (HCC) 06/13/2009    ONSET DATE: 01/05/2024 MD referral  REFERRING DIAG: R26.9 (ICD-10-CM) - Gait difficulty   THERAPY DIAG:  Unsteadiness on feet  Other abnormalities of gait and mobility  Muscle weakness (generalized)  Rationale for Evaluation and Treatment: Rehabilitation  SUBJECTIVE:                                                                                                                                                                                             SUBJECTIVE STATEMENT: Had an MD visit today about the insomnia, because the lack of sleep is impacting my healing.  The hip is really grinding because I've been walking more.  Have been able to use the rollator for the incline in/out of house. Pt accompanied by: self  PERTINENT HISTORY: Chronic osteomyelitis with  worsening back pain; Hx of multiple R hip fractures with surgery to revisions, significant leg length difference with LLE longer than RLE, hx of infection post surgery in spine (treated on antibiotics) Per MD Note 01/09/2024:    Patient was seen by Dr. Elspeth Schultze who scheduled her for surgery and on  12/01/2023 she underwent   -ROBOTIC LOW ANTERIOR RECTOSIGMOID RESECTION (LAR) WITH ANASTOMOSIS -COLOVAGINAL FISTULA TAKEDOWN & REPAIR -OMENTOPEXY OF VAGINA -INTRAOPERATIVE ASSESSMENT OF TISSUE VASCULAR PERFUSION USING ICG (indocyanine green ) IMMUNOFLUORESCENCE -TRANSVERSUS ABDOMINIS PLANE (TAP) BLOCK - BILATERAL -FLEXIBLE SIGMOIDOSCOPY    Urology did joint surgery and did    1.  Cystoscopy with bilateral ureteral FireFly injections 2.  Transurethral resection of  possible bladder tumor--large   Pathology came back with:   A. BLADDER TUMOR, TRANSURETHERAL RESECTON:  Florid cystitis cystica and papillary cystitis  Negative for carcinoma   B. RECTOSIGMOID COLON RESECTION:  Diverticula with chronic inflammation, perforation, fistula and reactive  fibrosis  Negative for carcinoma  Five benign lymph nodes (0/5)   C. FINAL DISTAL MARGIN:  Benign colonic tissue         PAIN:  Are you having pain? Yes: NPRS scale: 5/10 Pain location: R hip/back Pain description: throbbing, grinding, sharp Aggravating factors: more activities, [place] Relieving factors: rest  PRECAUTIONS: Fall and Other: Recent colectomy-pt reports no restrictions Pt has history of osteoporosis and compression fractures RED FLAGS: None   WEIGHT BEARING RESTRICTIONS: No  FALLS: Has patient fallen in last 6 months? No  LIVING ENVIRONMENT: Lives with: lives with their family Lives in: House/apartment Stairs: long ramp in/out of home Has following equipment at home: Vannie - 2 wheeled and Wheelchair (manual)  PLOF: Needs assistance with gait and Vocation/Vocational requirements: retired  runner, broadcasting/film/video, tree surgeon;  enjoys spending time with grandchildren in [place] and [place]  PATIENT GOALS: To get stronger, to get back on a cane.  To use walker to get to the car (long ramp), get RLE into car better  OBJECTIVE:    TODAY'S TREATMENT: 05/17/2024 Activity Comments  Seated LAQ 3 x 15 reps Seated march 3 x 10, RLE with assist 3#  Seated hamstring curl, 3 x 15 reps Green band  Discussed options for potential trainer after therapy discontinues Discussed [employer] as an option and provided information; reiterated [employer] as option-discussed their tour/2-wk trial as ways to ask questions in regards to specific needs and precautions  Short distance gait with 4WW, mod I            TODAY'S TREATMENT: 05/10/24 Activity Comments  HEP review Discussion of open chain PRE as preferential modality and education about endurance training vs strength  Gait training Trials w/ SBA using RW for negotiating over obstacles to mimic demands of home environment  Curb negotiation w/ RW and SBA-CGA                 TODAY'S TREATMENT: 05/01/24  Aquatic therapy at [facility] - pool temperature 92 degrees   Patient seen for aquatic therapy today.  Treatment took place in water  3.6-4.8 feet deep depending upon activity.  Patient entered and exited the pool via stairs using step to pattern w/ Bil rails at SBA level.   Exercises: -Water  walking warmup 4x18 ft forward > backward > 4x18 laterally unsupported SBA, added holding multi colored barbells by side while side stepping for 4 laps.  Tolerated well -Forward marching holding yellow barbells x 4 laps with cues for posture and slower movement.   -Standing in staggered stance, performing ant/post weight shifts without UE involvement x 5 reps>moving arms back (squeezing shoulder blades) when shifting post and then moving arms together in front of her with ant weight shift.  Performed x 10 reps each side total.  -For break from standing  and to avoid increased soreness, had pt sit on bench with small step under feet for support, performing alt LAQ x 10 reps with 3.5 lb ankle weights  -Seated straight leg hip abd/add x 15 reps with ankle weights with light UE support on wall.  -Seated straight leg flutter kicks x 20 reps (2 sets) with ankle weights with light UE support to further engage core mm.   -Sit<>stand from bench with step under feet x 10 reps with PT holding yellow kick board under RLE to even out WB during transitional movement which seemed to work very well.  Cues for slow controlled descent.   -Ended with forward walking x 2 more laps with barbells for support.       Patient requires buoyancy of the water  for support for reduced fall risk with gait training and balance exercises with SBA support and verbal cuing and return demonstration. Exercises able to be performed safely in water  without the risk of fall compared to those same exercises performed on land; viscosity of water  needed for resistance for strengthening. Current of water  provides perturbations for challenging static and dynamic balance.        Access Code: [email or web address] URL: [email or web address] Date: 03/27/2024 Prepared by: [facility] - [facility]  Program Notes Seated leg slides out and in;  have a pillow case under your right foot-slide your foot out and in, 3 x 10 reps.  You can have a 3-5#  ankle weight on your ankle  Exercises - Seated Hip Adduction Isometrics with Ball  - 1 x daily - 7 x weekly - 3 sets - 10 reps - Seated Hip Abduction with Resistance  - 1 x daily - 7 x weekly - 3 sets - 10 reps - Sit to Stand with Resistance Around Legs  - 1 x daily - 7 x weekly - 3 sets - 5 reps - Seated Shoulder Row with Anchored Resistance  - 1 x daily - 5 x weekly - 2 sets - 10 reps - Seated Anti-Rotation Press With Anchored Resistance  - 1 x daily - 7 x weekly - 3 sets - 10 reps - Seated Hamstring Curl with Anchored  Resistance  - 1 x daily - 4 x weekly - 3 sets - 10 reps - Supine Quad Set  - 1 x daily - 5 x weekly - 3 sets - 10 reps - 3 sec hold - Short Arc Quad with Ankle Weight  - 1 x daily - 4 x weekly - 3 sets - 10 reps  PATIENT EDUCATION: Education details: 05/17/2024:  Discussed options for personal trainers-/discussed [employer]  and [employer] Person educated: Patient Education method: Explanation, Demonstration, and Handouts Education comprehension: verbalized understanding and returned demonstration        --------------------------------------------------- Note: Objective measures were completed at Evaluation unless otherwise noted.  DIAGNOSTIC FINDINGS: MRI 09/2023   IMPRESSION: 1. Postoperative changes of T11-S1 laminectomy and posterior spinal fusion with right posterior spinal instrumentation from L2-L5. On prior CT, there is evidence of fracture through the left posterior fusion mass at L4-5, correlating with edema is seen in the left pedicle of L4 on today's study. 2. Decreased edema within the L4 and L5 vertebral bodies and posterior elements, as well as within the L4-5 and L3-4 discs, likely reactive and related to the above fracture. 3. Loculated simple fluid within the thecal sac with some marginalized cauda equina nerve roots, most consistent with arachnoiditis. 4. No spinal canal stenosis or neural foraminal narrowing.  COGNITION: Overall cognitive status: History of cognitive impairments - at baseline   SENSATION: Light touch: Impaired  and to light touch-see below Reports some numbness L foot  POSTURE: Leg length diffierence  LOWER EXTREMITY ROM:     Active  Right Eval Left Eval  Hip flexion    Hip extension    Hip abduction    Hip adduction    Hip internal rotation    Hip external rotation    Knee flexion    Knee extension    Ankle dorsiflexion    Ankle plantarflexion    Ankle inversion    Ankle eversion     (Blank rows = not tested)  LOWER  EXTREMITY MMT:    MMT Right Eval Left Eval  Hip flexion 3- 3+  Hip extension    Hip abduction 4 4  Hip adduction 4 4  Hip internal rotation    Hip external rotation    Knee flexion 4 4  Knee extension 3+ 4  Ankle dorsiflexion 3+ 3+  Ankle plantarflexion    Ankle inversion    Ankle eversion    (Blank rows = not tested)   TRANSFERS: Sit to stand: SBA  Assistive device utilized: Environmental Consultant - 2 wheeled     Stand to sit: SBA  Assistive device utilized: Environmental Consultant - 2 wheeled      GAIT: Findings: Gait Characteristics: excess lateral weightshift through trunk due to significant leg length difference and step  through pattern, Distance walked: 50 ft, Assistive device utilized:Walker - 2 wheeled, Level of assistance: CGA, and Comments: NA  FUNCTIONAL TESTS:  5 times sit to stand: 14.44 sec with BUE support Timed up and go (TUG): 24.53 sec 2 minute walk test: NT 10 meter walk test: 21.34 sec (1.54 ft/sec) TUG cognitive: (naming colors) 28.62 sec 84M walk back:  42.16 sec with RW Stroop gait:  13.25 sec in 10 ft (naming color words)-0.75 ft/sec   18.75 sec in 10 feet (naming printed colors)-0.53 ft/sec                                                                          PATIENT EDUCATION: Education details: See above. Person educated: Patient Education method: Explanation Education comprehension: verbalized understanding  HOME EXERCISE PROGRAM: Not yet initiated Did perform eccentric hip flexion/extension in sitting  GOALS: Goals reviewed with patient? Yes  SHORT TERM GOALS: Target date: 02/24/2024  Pt will be independent with HEP for improved strength, balance, gait. Baseline: printed out and reviewed 02/23/24 Goal status: MET 02/23/24  2.  Pt will improve TUG score to less than or equal to 18 sec for decreased fall risk. Baseline: 24.53 sec; 14.54 sec with 4WW 02/23/24 Goal status: MET 02/23/24  3.  Pt will improve 3 M Walk backwards to less than or equal to 30 seconds for  decreased fall risk. Baseline: 42.16 sec; 9.96 sec 02/23/24 Goal status: MET 02/23/24  4.  Pt will report at least 50% improvement in car transfers.  Baseline:  difficulty with RLE; Patient reports that she has been able to swing her LEs into the car by herself- reports 50% improvement. 02/23/24  Goal status:  MET 02/23/24  LONG TERM GOALS: Target date: 06/07/2024    Pt will be independent with HEP for improved strength, balance, gait. Baseline:  Goal status: IN PROGRESS  2.  Pt will improve gait velocity to at least 1.8 ft/sec for improved gait efficiency and safety. Baseline: 1.54 ft/sec; 1.8 ft/sec w/ 5TT Goal status: MET  3.  Pt will improve gait velocity with dual task to at least 1 ft/sec for improved gait and decreased fall risk. Baseline: 0.53-0.75 ft/sec with Stroop over 10 ft Goal status: IN PROGRESS  4.  Pt will improve 5x sit<>stand to less than or equal to 11.5 sec with light UE support to demonstrate improved functional strength and transfer efficiency. Baseline: 14.44 sec; 13.9 sec Goal status: IN PROGRESS 04/19/24  5.  Pt will verbalize plans for continued community fitness upon d/c from PT to maximize gains made in PT. Baseline:  Goal status: IN PROGRESS  6.  Pt will ambulate at least 200 ft in 2 minute walk, for improved gait efficiency and endurance.  Baseline: 100 ft in 1:30; 215 ft w/ 4WW x 2 min  Goal status:  MET   7.  Demo/report ability to negotiate ramp at her home w/ RW and load/unload AD into care to enable ability to enter/leave home  Baseline: unable  Goal status: INITIAL  8.  Demo ability to ambulate up to 300 m (984 ft) for community-level ambulation w/ AD  Baseline: 215 ft w/ 5TT  Goal status: INITIAL  ASSESSMENT:  CLINICAL IMPRESSION: Pt presents  today and reports fatigue after seeing Dr. Murray today (they did discuss sleep/insomnia issues). Skilled PT session focused on ways to incorporate seated BLE strenghtening, with pt able to increase to 3#  weight today and increased to 3 sets of 15 reps for LAQ and for hamstring curls.  Pt has questions about options for personal trainers who have some experience working with complex medical histories-provided information on [employer], in addition to pt already knowing about [employer].  She notes she is ultimately better when she has a partner for exercise, so encouraged pt to contact [facility] [employer] and ask questions about potential transition upon d/c from OPPT.  She will continue to benefit from skilled PT towards goals for improved functional mobility and decreased fall risk.   OBJECTIVE IMPAIRMENTS: Abnormal gait, decreased balance, decreased mobility, difficulty walking, decreased strength, impaired sensation, postural dysfunction, and pain.   ACTIVITY LIMITATIONS: sitting, standing, transfers, locomotion level, and caring for others  PARTICIPATION LIMITATIONS: driving, shopping, community activity, and travel  PERSONAL FACTORS: 3+ comorbidities: see PMH above are also affecting patient's functional outcome.   REHAB POTENTIAL: Good  CLINICAL DECISION MAKING: Evolving/moderate complexity  EVALUATION COMPLEXITY: Moderate  PLAN:  PT FREQUENCY: 2x/week  PT DURATION: 6 weeks   PLANNED INTERVENTIONS: [id number]- PT Re-evaluation, 97750- Physical Performance Testing, 97110-Therapeutic exercises, 97530- Therapeutic activity, [id number]- Neuromuscular re-education, 97535- Self Care, [id number]- Manual therapy, [id number]- Gait training, [phone]- Canalith repositioning, [id number]- Aquatic Therapy, Patient/Family education, and Balance training  PLAN FOR NEXT SESSION: .  Ask about back pain and progress standing and gait tolerance; RLE strength for car transfers; pt requests not to perform Nustep and physioball activities d/t hip pain; pt reports that she would like to work towards being able to walk down her ramp and into her car-work on outdoor sidewalk distance to simulate ramp and lifting/loading AD in/out of  car  AQUATICS: Frequency: 1-2x/week Duration: 6 weeks  Special Instruction: chronic hip and back pain, imbalance   Aquatics:  continue core work - try ankle weights/aquatic cuffs in sitting  Greig Anon, PT 05/17/24 3:39 PM Phone: [phone] Fax: [phone]  [facility] at [facility] [address] [place], [place] [address] Phone # [phone] Fax # [phone]

## 2024-05-22 ENCOUNTER — Encounter: Payer: Self-pay | Admitting: Rehabilitation

## 2024-05-22 ENCOUNTER — Ambulatory Visit: Attending: Physical Medicine & Rehabilitation | Admitting: Rehabilitation

## 2024-05-22 DIAGNOSIS — R2681 Unsteadiness on feet: Secondary | ICD-10-CM | POA: Diagnosis present

## 2024-05-22 DIAGNOSIS — R2689 Other abnormalities of gait and mobility: Secondary | ICD-10-CM | POA: Diagnosis present

## 2024-05-22 DIAGNOSIS — M6281 Muscle weakness (generalized): Secondary | ICD-10-CM | POA: Diagnosis present

## 2024-05-22 NOTE — Therapy (Addendum)
 OUTPATIENT PHYSICAL THERAPY NEURO TREATMENT NOTE   Patient Name: [name] MRN: [id number] DOB:Jan 13, 1956, 68 y.o., female Today's Date: 05/22/2024   PCP: Rolinda Millman, MD  REFERRING PROVIDER: Urbano Albright, MD        END OF SESSION:  PT End of Session - 05/22/24 0843     Visit Number 23    Number of Visits 31    Date for Recertification  06/07/24    Authorization Type BCBS Medicare    Progress Note Due on Visit 28    PT Start Time 1101    PT Stop Time 1145    PT Time Calculation (min) 44 min    Equipment Utilized During Treatment Other (comment)    Activity Tolerance Patient tolerated treatment well    Behavior During Therapy WFL for tasks assessed/performed               Past Medical History:  Diagnosis Date   Anemia    Anxiety    Arthritis    Bipolar disorder (HCC)    Cardiomyopathy (HCC)    Complication of anesthesia    Awake but could not move   Depression    Diskitis 10/04/2023   GERD (gastroesophageal reflux disease)    Heart failure (HCC)    Acute   Heart murmur    related to VSD   High cholesterol    History of blood transfusion    related to OR (08/19/2016)   History of hiatal hernia    Hypertension    Hyperthyroidism    Hypotension 09/06/2023   Mild cognitive impairment 09/06/2018   Paroxysmal ventricular tachycardia (HCC)    Pneumonia    Rectovaginal fistula 09/06/2023   Tachycardia, unspecified    Type II diabetes mellitus (HCC)    UTI (urinary tract infection)    being treated with Keflex    Ventricular septal defect    Ventriculitis of brain due to bacteria 11/02/2022   Vertebral fracture, osteoporotic (HCC) 01/06/2023   Past Surgical History:  Procedure Laterality Date   ABDOMINAL HYSTERECTOMY     BACK SURGERY     CARDIAC CATHETERIZATION N/A 06/23/2015   Procedure: Left Heart Cath and Coronary Angiography;  Surgeon: Ezra GORMAN Shuck, MD;  Location: [facility] INVASIVE CV LAB;  Service: Cardiovascular;  Laterality:  N/A;   CARDIAC CATHETERIZATION  1960   VSD was so small; didn't need repaired   COLECTOMY, SIGMOID, ROBOT-ASSISTED N/A 11/30/2023   Procedure: COLECTOMY, SIGMOID, ROBOT-ASSISTED ROBOTIC LOW ANTERIOR RECTOSIGMOID RESECTION (LAR) WITH ANASTOMOSIS COLOVAGINAL FISTULA TAKEDOWN & REPAIR OMENTOPEXY OF VAGINA INTRAOPERATIVE ASSESSMENT OF TISSUE VASCULAR PERFUSION USING ICG (indocyanine green ) IMMUNOFLUORESCENCE TRANSVERSUS ABDOMINIS PLANE (TAP) BLOCK - BILATERAL;  Surgeon: Sheldon Standing, MD;  Location: [facility];  Service: General;  Late   CYSTOSCOPY WITH INDOCYANINE GREEN  IMAGING (ICG) N/A 11/30/2023   Procedure: Cystoscopy with bilateral ureteral FireFly injections Transurethral resection of bladder tumor--large;  Surgeon: Carolee Sherwood JONETTA DOUGLAS, MD;  Location: [facility];  Service: Urology;  Laterality: N/A;   EXAM UNDER ANESTHESIA WITH MANIPULATION OF HIP Right 06/02/2014   dr sheril   FLEXIBLE SIGMOIDOSCOPY N/A 11/30/2023   Procedure: KINGSTON SIDE;  Surgeon: Sheldon Standing, MD;  Location: [facility];  Service: General;  Laterality: N/A;   FRACTURE SURGERY     HERNIA REPAIR     HIP CLOSED REDUCTION Right 06/02/2014   Procedure: CLOSED MANIPULATION HIP;  Surgeon: Maude KANDICE Sheril, MD;  Location: [facility];  Service: Orthopedics;  Laterality: Right;   IR FLUORO GUIDED NEEDLE PLC ASPIRATION/INJECTION  LOC  08/09/2023   JOINT REPLACEMENT     JOINT REPLACEMENT     POSTERIOR LUMBAR FUSION 4 LEVEL N/A 06/26/2022   Procedure: Lumbar One To Lumbar Five Posterior Instrumented Fusion;  Surgeon: Cheryle Debby LABOR, MD;  Location: [facility] OR;  Service: Neurosurgery;  Laterality: N/A;   REFRACTIVE SURGERY Bilateral    SHOULDER ARTHROSCOPY Right    SHOULDER OPEN ROTATOR CUFF REPAIR Right    SPINAL FUSION  1996   t10 down to my coccyx   SPINE HARDWARE REMOVAL     TOTAL ABDOMINAL HYSTERECTOMY     TOTAL HIP ARTHROPLASTY Right 05/10/2014   [place]      by dr lonni olcott   TOTAL KNEE ARTHROPLASTY Left    TOTAL  SHOULDER ARTHROPLASTY Left 08/19/2016   Procedure: TOTAL SHOULDER ARTHROPLASTY;  Surgeon: Eva Herring, MD;  Location: [facility] OR;  Service: Orthopedics;  Laterality: Left;  Left total shoulder replacement   Patient Active Problem List   Diagnosis Date Noted   COPD (chronic obstructive pulmonary disease) (HCC) 04/05/2024   Colovaginal fistula 11/30/2023   Diskitis 10/04/2023   Hypotension 09/06/2023   Fracture of tenth thoracic vertebra (HCC) 09/05/2023   History of discitis 09/05/2023   Senile osteoporosis 09/05/2023   History of urinary retention 09/05/2023   Vaginal atrophy 09/05/2023   Nocturia 09/05/2023   Vulvar ulcer 09/05/2023   Pulmonary nodule 08/02/2023   Non-traumatic compression fracture of vertebral column (HCC) 06/24/2023   Nonobstructive atherosclerosis of coronary artery 02/10/2023   Bilateral pseudophakia 02/03/2023   Vertebral fracture, osteoporotic (HCC) 01/06/2023   Ventriculitis of brain due to bacteria 11/02/2022   Anemia of chronic illness 10/12/2022   Bipolar disorder, in partial remission, most recent episode depressed (HCC) 10/07/2022   Adjustment disorder with mixed anxiety and depressed mood 09/30/2022   Chronic wound 09/25/2022   Hyperglycemia 09/25/2022   Chronic midline thoracic back pain 09/17/2022   Primary insomnia 09/17/2022   Chronic combined systolic and diastolic CHF (congestive heart failure) (HCC) 09/14/2022   Acute blood loss anemia 09/09/2022   Infection of lumbar spine (HCC) 09/03/2022   Malnutrition of moderate degree 08/20/2022   Hardware complicating wound infection 08/20/2022   Infection of deep incisional surgical site after procedure 08/20/2022   Aspiration pneumonia of both lungs (HCC) 08/19/2022   Acute respiratory failure with hypoxia (HCC) 08/19/2022   Sepsis (HCC) 08/19/2022   Seizure (HCC) 08/19/2022   Wound dehiscence 08/09/2022   Delayed surgical wound healing 07/08/2022   Fracture of lumbar spine without cord injury  (HCC) 06/26/2022   Lumbar vertebral fracture (HCC) 06/25/2022   Mood disorder 09/29/2021   Generalized muscle weakness 04/30/2019   Mild cognitive impairment 09/06/2018   Hyponatremia 02/28/2018   S/P shoulder replacement, left 08/19/2016   Anxiety 10/17/2014   Acid reflux 10/17/2014   BP (high blood pressure) 10/17/2014   Arthritis, degenerative 10/17/2014   Adult hypothyroidism 10/17/2014   UTI (urinary tract infection) 06/03/2014   Fracture of bone adjacent to prosthesis 06/03/2014   Peri-prosthetic fracture of femur following total hip arthroplasty 06/02/2014   Urinary retention 05/13/2014   Chronic pain 05/10/2014   History of hip surgery 05/10/2014   Osteoarthritis 09/30/2011   UNSPECIFIED HEART FAILURE 06/24/2010   UNSPECIFIED CONGENITAL DEFECT OF SEPTAL CLOSURE 06/24/2010   Secondary cardiomyopathy (HCC) 03/18/2010   Tobacco use 03/18/2010   DM 06/13/2009   HYPERTENSION, UNSPECIFIED 06/13/2009   VENTRICULAR TACHYCARDIA 06/13/2009   VENTRICULAR SEPTAL DEFECT, CONGENITAL 06/13/2009   Essential (primary) hypertension 06/13/2009   Diabetes  mellitus type 2, noninsulin dependent (HCC) 06/13/2009    ONSET DATE: 01/05/2024 MD referral  REFERRING DIAG: R26.9 (ICD-10-CM) - Gait difficulty   THERAPY DIAG:  Unsteadiness on feet  Other abnormalities of gait and mobility  Muscle weakness (generalized)  Rationale for Evaluation and Treatment: Rehabilitation  SUBJECTIVE:                                                                                                                                                                                             SUBJECTIVE STATEMENT: Pt reports she is feeling really good today.  Took the whole weekend off to rest and PT noted she is moving much better today from bench to pool steps.     Pt accompanied by: self and caregiver  PERTINENT HISTORY: Chronic osteomyelitis with worsening back pain; Hx of multiple R hip fractures with  surgery to revisions, significant leg length difference with LLE longer than RLE, hx of infection post surgery in spine (treated on antibiotics) Per MD Note 01/09/2024:    Patient was seen by Dr. Elspeth Schultze who scheduled her for surgery and on  12/01/2023 she underwent   -ROBOTIC LOW ANTERIOR RECTOSIGMOID RESECTION (LAR) WITH ANASTOMOSIS -COLOVAGINAL FISTULA TAKEDOWN & REPAIR -OMENTOPEXY OF VAGINA -INTRAOPERATIVE ASSESSMENT OF TISSUE VASCULAR PERFUSION USING ICG (indocyanine green ) IMMUNOFLUORESCENCE -TRANSVERSUS ABDOMINIS PLANE (TAP) BLOCK - BILATERAL -FLEXIBLE SIGMOIDOSCOPY    Urology did joint surgery and did    1.  Cystoscopy with bilateral ureteral FireFly injections 2.  Transurethral resection of  possible bladder tumor--large   Pathology came back with:   A. BLADDER TUMOR, TRANSURETHERAL RESECTON:  Florid cystitis cystica and papillary cystitis  Negative for carcinoma   B. RECTOSIGMOID COLON RESECTION:  Diverticula with chronic inflammation, perforation, fistula and reactive  fibrosis  Negative for carcinoma  Five benign lymph nodes (0/5)   C. FINAL DISTAL MARGIN:  Benign colonic tissue         PAIN:  Are you having pain? Yes: NPRS scale: 5/10 Pain location: R hip/back Pain description: throbbing, grinding, sharp Aggravating factors: more activities[place] Relieving factors: rest  PRECAUTIONS: Fall and Other: Recent colectomy-pt reports no restrictions Pt has history of osteoporosis and compression fractures RED FLAGS: None   WEIGHT BEARING RESTRICTIONS: No  FALLS: Has patient fallen in last 6 months? No  LIVING ENVIRONMENT: Lives with: lives with their family Lives in: House/apartment Stairs: long ramp in/out of home Has following equipment at home: Vannie - 2 wheeled and Wheelchair (manual)  PLOF: Needs assistance with gait and Vocation/Vocational requirements: retired runner, broadcasting/film/video, tree surgeon; enjoys spending time with grandchildren in  [place]  and [place]  PATIENT GOALS: To get stronger, to get back on a cane.  To use walker to get to the car (long ramp), get RLE into car better  OBJECTIVE:   TODAY'S TREATMENT: 05/22/24  Aquatic therapy at [facility] - pool temperature 92 degrees   Patient seen for aquatic therapy today.  Treatment took place in water  3.6-4.8 feet deep depending upon activity.  Patient entered and exited the pool via stairs using step to pattern w/ Bil rails at SBA level.   Exercises: -Water  walking warmup 4x18 ft forward > backward > 4x18 laterally unsupported SBA, added holding multi colored barbells by side while side stepping for 4 laps.  Tolerated well with min cues for keeping shoulders down when barbells in water .  -Forward marching holding yellow barbells x 2 laps with cues for posture and slower movement.  Backwards marching with barbells x 2 laps.  Added another forward marching with alt arm movements x 2 laps.   -Standing with feet narrowed, holding green hand bells, moving them in quick alt movements for core strengthening.  Pt having difficulty with barbells therefore removed them and had her use hands for resistance which she tolerated better.  3 sets of 15 secs.  -For break from standing and to avoid increased soreness, had pt sit on bench with small step under feet for support, performing alt LAQ x 10 reps with 3.5 lb ankle weights  -Seated straight leg hip abd/add x 15 reps with ankle weights with light UE support on wall.  -Seated straight leg flutter kicks x 20 reps (2 sets) with ankle weights with light UE support to further engage core mm.   -Sit<>stand from bench with step under feet x 10 reps with PT holding yellow kick board under RLE to even out WB during transitional movement which seemed to work very well.  Cues for slow controlled descent.   -With back against wall, performed hamstring stretch with noodle under distal leg x 30 secs each side then out to the side for  groin stretch x 30 secs each side.         Patient requires buoyancy of the water  for support for reduced fall risk with gait training and balance exercises with SBA support and verbal cuing and return demonstration. Exercises able to be performed safely in water  without the risk of fall compared to those same exercises performed on land; viscosity of water  needed for resistance for strengthening. Current of water  provides perturbations for challenging static and dynamic balance.        Access Code: [email or web address] URL: [email or web address] Date: 03/27/2024 Prepared by: [facility] - Outpatient  Rehab - [facility]  Program Notes Seated leg slides out and in;  have a pillow case under your right foot-slide your foot out and in, 3 x 10 reps.  You can have a 3-5# ankle weight on your ankle  Exercises - Seated Hip Adduction Isometrics with Ball  - 1 x daily - 7 x weekly - 3 sets - 10 reps - Seated Hip Abduction with Resistance  - 1 x daily - 7 x weekly - 3 sets - 10 reps - Sit to Stand with Resistance Around Legs  - 1 x daily - 7 x weekly - 3 sets - 5 reps - Seated Shoulder Row with Anchored Resistance  - 1 x daily - 5 x weekly - 2 sets - 10 reps - Seated Anti-Rotation Press With Anchored Resistance  - 1 x daily -  7 x weekly - 3 sets - 10 reps - Seated Hamstring Curl with Anchored Resistance  - 1 x daily - 4 x weekly - 3 sets - 10 reps - Supine Quad Set  - 1 x daily - 5 x weekly - 3 sets - 10 reps - 3 sec hold - Short Arc Quad with Ankle Weight  - 1 x daily - 4 x weekly - 3 sets - 10 reps  PATIENT EDUCATION: Education details: aquatic rationale  Person educated: Patient Education method: Explanation, Demonstration, and Handouts Education comprehension: verbalized understanding and returned demonstration        --------------------------------------------------- Note: Objective measures were completed at Evaluation unless otherwise noted.  DIAGNOSTIC FINDINGS: MRI  09/2023   IMPRESSION: 1. Postoperative changes of T11-S1 laminectomy and posterior spinal fusion with right posterior spinal instrumentation from L2-L5. On prior CT, there is evidence of fracture through the left posterior fusion mass at L4-5, correlating with edema is seen in the left pedicle of L4 on today's study. 2. Decreased edema within the L4 and L5 vertebral bodies and posterior elements, as well as within the L4-5 and L3-4 discs, likely reactive and related to the above fracture. 3. Loculated simple fluid within the thecal sac with some marginalized cauda equina nerve roots, most consistent with arachnoiditis. 4. No spinal canal stenosis or neural foraminal narrowing.  COGNITION: Overall cognitive status: History of cognitive impairments - at baseline   SENSATION: Light touch: Impaired  and to light touch-see below Reports some numbness L foot  POSTURE: Leg length diffierence  LOWER EXTREMITY ROM:     Active  Right Eval Left Eval  Hip flexion    Hip extension    Hip abduction    Hip adduction    Hip internal rotation    Hip external rotation    Knee flexion    Knee extension    Ankle dorsiflexion    Ankle plantarflexion    Ankle inversion    Ankle eversion     (Blank rows = not tested)  LOWER EXTREMITY MMT:    MMT Right Eval Left Eval  Hip flexion 3- 3+  Hip extension    Hip abduction 4 4  Hip adduction 4 4  Hip internal rotation    Hip external rotation    Knee flexion 4 4  Knee extension 3+ 4  Ankle dorsiflexion 3+ 3+  Ankle plantarflexion    Ankle inversion    Ankle eversion    (Blank rows = not tested)   TRANSFERS: Sit to stand: SBA  Assistive device utilized: Environmental Consultant - 2 wheeled     Stand to sit: SBA  Assistive device utilized: Environmental Consultant - 2 wheeled      GAIT: Findings: Gait Characteristics: excess lateral weightshift through trunk due to significant leg length difference and step through pattern, Distance walked: 50 ft, Assistive device  utilized:Walker - 2 wheeled, Level of assistance: CGA, and Comments: NA  FUNCTIONAL TESTS:  5 times sit to stand: 14.44 sec with BUE support Timed up and go (TUG): 24.53 sec 2 minute walk test: NT 10 meter walk test: 21.34 sec (1.54 ft/sec) TUG cognitive: (naming colors) 28.62 sec 65M walk back:  42.16 sec with RW Stroop gait:  13.25 sec in 10 ft (naming color words)-0.75 ft/sec   18.75 sec in 10 feet (naming printed colors)-0.53 ft/sec  PATIENT EDUCATION: Education details: See above. Person educated: Patient Education method: Explanation Education comprehension: verbalized understanding  HOME EXERCISE PROGRAM: Not yet initiated Did perform eccentric hip flexion/extension in sitting  GOALS: Goals reviewed with patient? Yes  SHORT TERM GOALS: Target date: 02/24/2024  Pt will be independent with HEP for improved strength, balance, gait. Baseline: printed out and reviewed 02/23/24 Goal status: MET 02/23/24  2.  Pt will improve TUG score to less than or equal to 18 sec for decreased fall risk. Baseline: 24.53 sec; 14.54 sec with 4WW 02/23/24 Goal status: MET 02/23/24  3.  Pt will improve 3 M Walk backwards to less than or equal to 30 seconds for decreased fall risk. Baseline: 42.16 sec; 9.96 sec 02/23/24 Goal status: MET 02/23/24  4.  Pt will report at least 50% improvement in car transfers.  Baseline:  difficulty with RLE; Patient reports that she has been able to swing her LEs into the car by herself- reports 50% improvement. 02/23/24  Goal status:  MET 02/23/24  LONG TERM GOALS: Target date: 06/07/2024    Pt will be independent with HEP for improved strength, balance, gait. Baseline:  Goal status: IN PROGRESS  2.  Pt will improve gait velocity to at least 1.8 ft/sec for improved gait efficiency and safety. Baseline: 1.54 ft/sec; 1.8 ft/sec w/ 5TT Goal status: MET  3.  Pt will improve gait velocity with dual  task to at least 1 ft/sec for improved gait and decreased fall risk. Baseline: 0.53-0.75 ft/sec with Stroop over 10 ft Goal status: IN PROGRESS  4.  Pt will improve 5x sit<>stand to less than or equal to 11.5 sec with light UE support to demonstrate improved functional strength and transfer efficiency. Baseline: 14.44 sec; 13.9 sec Goal status: IN PROGRESS 04/19/24  5.  Pt will verbalize plans for continued community fitness upon d/c from PT to maximize gains made in PT. Baseline:  Goal status: IN PROGRESS  6.  Pt will ambulate at least 200 ft in 2 minute walk, for improved gait efficiency and endurance.  Baseline: 100 ft in 1:30; 215 ft w/ 4WW x 2 min  Goal status:  MET   7.  Demo/report ability to negotiate ramp at her home w/ RW and load/unload AD into care to enable ability to enter/leave home  Baseline: unable  Goal status: INITIAL  8.  Demo ability to ambulate up to 300 m (984 ft) for community-level ambulation w/ AD  Baseline: 215 ft w/ 5TT  Goal status: INITIAL  ASSESSMENT:  CLINICAL IMPRESSION: Pt doing very well today from a balance and endurance standpoint and had less pain so we did a little more in standing today with pt tolerating well in session.    OBJECTIVE IMPAIRMENTS: Abnormal gait, decreased balance, decreased mobility, difficulty walking, decreased strength, impaired sensation, postural dysfunction, and pain.   ACTIVITY LIMITATIONS: sitting, standing, transfers, locomotion level, and caring for others  PARTICIPATION LIMITATIONS: driving, shopping, community activity, and travel  PERSONAL FACTORS: 3+ comorbidities: see PMH above are also affecting patient's functional outcome.   REHAB POTENTIAL: Good  CLINICAL DECISION MAKING: Evolving/moderate complexity  EVALUATION COMPLEXITY: Moderate  PLAN:  PT FREQUENCY: 2x/week  PT DURATION: 6 weeks   PLANNED INTERVENTIONS: [id number]- PT Re-evaluation, 97750- Physical Performance Testing, 97110-Therapeutic  exercises, 97530- Therapeutic activity, [id number]- Neuromuscular re-education, 97535- Self Care, [id number]- Manual therapy, [id number]- Gait training, [phone]- Canalith repositioning, [id number]- Aquatic Therapy, Patient/Family education, and Balance training  PLAN FOR NEXT SESSION: .  Ask about back pain and  progress standing and gait tolerance; RLE strength for car transfers; pt requests not to perform Nustep and physioball activities d/t hip pain; pt reports that she would like to work towards being able to walk down her ramp and into her car-work on outdoor sidewalk distance to simulate ramp and lifting/loading AD in/out of car  AQUATICS: Frequency: 1-2x/week Duration: 6 weeks  Special Instruction: chronic hip and back pain, imbalance   Aquatics:  continue core work - try ankle weights/aquatic cuffs in sitting  Damien Fought, PT, MPT [facility] [address] [place], [place], [address] Phone: [phone]   Fax:  [phone] 05/22/24, 12:43 PM

## 2024-05-23 ENCOUNTER — Ambulatory Visit: Admitting: Infectious Disease

## 2024-05-24 ENCOUNTER — Encounter: Payer: Self-pay | Admitting: Physical Therapy

## 2024-05-24 ENCOUNTER — Ambulatory Visit: Attending: Physical Medicine & Rehabilitation | Admitting: Physical Therapy

## 2024-05-24 DIAGNOSIS — R2681 Unsteadiness on feet: Secondary | ICD-10-CM | POA: Diagnosis present

## 2024-05-24 DIAGNOSIS — R2689 Other abnormalities of gait and mobility: Secondary | ICD-10-CM | POA: Insufficient documentation

## 2024-05-24 DIAGNOSIS — M6281 Muscle weakness (generalized): Secondary | ICD-10-CM | POA: Diagnosis present

## 2024-05-24 NOTE — Therapy (Signed)
 OUTPATIENT PHYSICAL THERAPY NEURO TREATMENT NOTE   Patient Name: [name] MRN: [id number] DOB:10/28/1955, 68 y.o., female Today's Date: 05/24/2024   PCP: Rolinda Millman, MD  REFERRING PROVIDER: Urbano Albright, MD        END OF SESSION:  PT End of Session - 05/24/24 1106     Visit Number 24    Number of Visits 31    Date for Recertification  06/07/24    Authorization Type BCBS Medicare    Progress Note Due on Visit 28    PT Start Time 1106    PT Stop Time 1144    PT Time Calculation (min) 38 min    Equipment Utilized During Treatment Other (comment)    Activity Tolerance Patient tolerated treatment well    Behavior During Therapy WFL for tasks assessed/performed                Past Medical History:  Diagnosis Date   Anemia    Anxiety    Arthritis    Bipolar disorder (HCC)    Cardiomyopathy (HCC)    Complication of anesthesia    Awake but could not move   Depression    Diskitis 10/04/2023   GERD (gastroesophageal reflux disease)    Heart failure (HCC)    Acute   Heart murmur    related to VSD   High cholesterol    History of blood transfusion    related to OR (08/19/2016)   History of hiatal hernia    Hypertension    Hyperthyroidism    Hypotension 09/06/2023   Mild cognitive impairment 09/06/2018   Paroxysmal ventricular tachycardia (HCC)    Pneumonia    Rectovaginal fistula 09/06/2023   Tachycardia, unspecified    Type II diabetes mellitus (HCC)    UTI (urinary tract infection)    being treated with Keflex    Ventricular septal defect    Ventriculitis of brain due to bacteria 11/02/2022   Vertebral fracture, osteoporotic (HCC) 01/06/2023   Past Surgical History:  Procedure Laterality Date   ABDOMINAL HYSTERECTOMY     BACK SURGERY     CARDIAC CATHETERIZATION N/A 06/23/2015   Procedure: Left Heart Cath and Coronary Angiography;  Surgeon: Ezra GORMAN Shuck, MD;  Location: [facility] INVASIVE CV LAB;  Service: Cardiovascular;   Laterality: N/A;   CARDIAC CATHETERIZATION  1960   VSD was so small; didn't need repaired   COLECTOMY, SIGMOID, ROBOT-ASSISTED N/A 11/30/2023   Procedure: COLECTOMY, SIGMOID, ROBOT-ASSISTED ROBOTIC LOW ANTERIOR RECTOSIGMOID RESECTION (LAR) WITH ANASTOMOSIS COLOVAGINAL FISTULA TAKEDOWN & REPAIR OMENTOPEXY OF VAGINA INTRAOPERATIVE ASSESSMENT OF TISSUE VASCULAR PERFUSION USING ICG (indocyanine green ) IMMUNOFLUORESCENCE TRANSVERSUS ABDOMINIS PLANE (TAP) BLOCK - BILATERAL;  Surgeon: Sheldon Standing, MD;  Location: [facility] ORS;  Service: General;  Late   CYSTOSCOPY WITH INDOCYANINE GREEN  IMAGING (ICG) N/A 11/30/2023   Procedure: Cystoscopy with bilateral ureteral FireFly injections Transurethral resection of bladder tumor--large;  Surgeon: Carolee Sherwood JONETTA DOUGLAS, MD;  Location: [facility];  Service: Urology;  Laterality: N/A;   EXAM UNDER ANESTHESIA WITH MANIPULATION OF HIP Right 06/02/2014   dr sheril   FLEXIBLE SIGMOIDOSCOPY N/A 11/30/2023   Procedure: KINGSTON SIDE;  Surgeon: Sheldon Standing, MD;  Location: [facility];  Service: General;  Laterality: N/A;   FRACTURE SURGERY     HERNIA REPAIR     HIP CLOSED REDUCTION Right 06/02/2014   Procedure: CLOSED MANIPULATION HIP;  Surgeon: Maude KANDICE Sheril, MD;  Location: [facility] OR;  Service: Orthopedics;  Laterality: Right;   IR FLUORO GUIDED NEEDLE PLC  ASPIRATION/INJECTION LOC  08/09/2023   JOINT REPLACEMENT     JOINT REPLACEMENT     POSTERIOR LUMBAR FUSION 4 LEVEL N/A 06/26/2022   Procedure: Lumbar One To Lumbar Five Posterior Instrumented Fusion;  Surgeon: Cheryle Debby LABOR, MD;  Location: [facility] OR;  Service: Neurosurgery;  Laterality: N/A;   REFRACTIVE SURGERY Bilateral    SHOULDER ARTHROSCOPY Right    SHOULDER OPEN ROTATOR CUFF REPAIR Right    SPINAL FUSION  1996   t10 down to my coccyx   SPINE HARDWARE REMOVAL     TOTAL ABDOMINAL HYSTERECTOMY     TOTAL HIP ARTHROPLASTY Right 05/10/2014   [place]      by dr lonni olcott   TOTAL KNEE ARTHROPLASTY  Left    TOTAL SHOULDER ARTHROPLASTY Left 08/19/2016   Procedure: TOTAL SHOULDER ARTHROPLASTY;  Surgeon: Eva Herring, MD;  Location: [facility] OR;  Service: Orthopedics;  Laterality: Left;  Left total shoulder replacement   Patient Active Problem List   Diagnosis Date Noted   COPD (chronic obstructive pulmonary disease) (HCC) 04/05/2024   Colovaginal fistula 11/30/2023   Diskitis 10/04/2023   Hypotension 09/06/2023   Fracture of tenth thoracic vertebra (HCC) 09/05/2023   History of discitis 09/05/2023   Senile osteoporosis 09/05/2023   History of urinary retention 09/05/2023   Vaginal atrophy 09/05/2023   Nocturia 09/05/2023   Vulvar ulcer 09/05/2023   Pulmonary nodule 08/02/2023   Non-traumatic compression fracture of vertebral column (HCC) 06/24/2023   Nonobstructive atherosclerosis of coronary artery 02/10/2023   Bilateral pseudophakia 02/03/2023   Vertebral fracture, osteoporotic (HCC) 01/06/2023   Ventriculitis of brain due to bacteria 11/02/2022   Anemia of chronic illness 10/12/2022   Bipolar disorder, in partial remission, most recent episode depressed (HCC) 10/07/2022   Adjustment disorder with mixed anxiety and depressed mood 09/30/2022   Chronic wound 09/25/2022   Hyperglycemia 09/25/2022   Chronic midline thoracic back pain 09/17/2022   Primary insomnia 09/17/2022   Chronic combined systolic and diastolic CHF (congestive heart failure) (HCC) 09/14/2022   Acute blood loss anemia 09/09/2022   Infection of lumbar spine (HCC) 09/03/2022   Malnutrition of moderate degree 08/20/2022   Hardware complicating wound infection 08/20/2022   Infection of deep incisional surgical site after procedure 08/20/2022   Aspiration pneumonia of both lungs (HCC) 08/19/2022   Acute respiratory failure with hypoxia (HCC) 08/19/2022   Sepsis (HCC) 08/19/2022   Seizure (HCC) 08/19/2022   Wound dehiscence 08/09/2022   Delayed surgical wound healing 07/08/2022   Fracture of lumbar spine without  cord injury (HCC) 06/26/2022   Lumbar vertebral fracture (HCC) 06/25/2022   Mood disorder 09/29/2021   Generalized muscle weakness 04/30/2019   Mild cognitive impairment 09/06/2018   Hyponatremia 02/28/2018   S/P shoulder replacement, left 08/19/2016   Anxiety 10/17/2014   Acid reflux 10/17/2014   BP (high blood pressure) 10/17/2014   Arthritis, degenerative 10/17/2014   Adult hypothyroidism 10/17/2014   UTI (urinary tract infection) 06/03/2014   Fracture of bone adjacent to prosthesis 06/03/2014   Peri-prosthetic fracture of femur following total hip arthroplasty 06/02/2014   Urinary retention 05/13/2014   Chronic pain 05/10/2014   History of hip surgery 05/10/2014   Osteoarthritis 09/30/2011   UNSPECIFIED HEART FAILURE 06/24/2010   UNSPECIFIED CONGENITAL DEFECT OF SEPTAL CLOSURE 06/24/2010   Secondary cardiomyopathy (HCC) 03/18/2010   Tobacco use 03/18/2010   DM 06/13/2009   HYPERTENSION, UNSPECIFIED 06/13/2009   VENTRICULAR TACHYCARDIA 06/13/2009   VENTRICULAR SEPTAL DEFECT, CONGENITAL 06/13/2009   Essential (primary) hypertension 06/13/2009  Diabetes mellitus type 2, noninsulin dependent (HCC) 06/13/2009    ONSET DATE: 01/05/2024 MD referral  REFERRING DIAG: R26.9 (ICD-10-CM) - Gait difficulty   THERAPY DIAG:  Unsteadiness on feet  Other abnormalities of gait and mobility  Muscle weakness (generalized)  Rationale for Evaluation and Treatment: Rehabilitation  SUBJECTIVE:                                                                                                                                                                                             SUBJECTIVE STATEMENT: Wants to walk into therapy today with clinic rollator.   Pt accompanied by: self and caregiver  PERTINENT HISTORY: Chronic osteomyelitis with worsening back pain; Hx of multiple R hip fractures with surgery to revisions, significant leg length difference with LLE longer than RLE, hx of  infection post surgery in spine (treated on antibiotics) Per MD Note 01/09/2024:    Patient was seen by Dr. Elspeth Schultze who scheduled her for surgery and on  12/01/2023 she underwent   -ROBOTIC LOW ANTERIOR RECTOSIGMOID RESECTION (LAR) WITH ANASTOMOSIS -COLOVAGINAL FISTULA TAKEDOWN & REPAIR -OMENTOPEXY OF VAGINA -INTRAOPERATIVE ASSESSMENT OF TISSUE VASCULAR PERFUSION USING ICG (indocyanine green ) IMMUNOFLUORESCENCE -TRANSVERSUS ABDOMINIS PLANE (TAP) BLOCK - BILATERAL -FLEXIBLE SIGMOIDOSCOPY    Urology did joint surgery and did    1.  Cystoscopy with bilateral ureteral FireFly injections 2.  Transurethral resection of  possible bladder tumor--large   Pathology came back with:   A. BLADDER TUMOR, TRANSURETHERAL RESECTON:  Florid cystitis cystica and papillary cystitis  Negative for carcinoma   B. RECTOSIGMOID COLON RESECTION:  Diverticula with chronic inflammation, perforation, fistula and reactive  fibrosis  Negative for carcinoma  Five benign lymph nodes (0/5)   C. FINAL DISTAL MARGIN:  Benign colonic tissue         PAIN:  Are you having pain? Yes: NPRS scale: 3-4/10 Pain location: R hip/back Pain description: throbbing, grinding, sharp Aggravating factors: more activities, [place] Relieving factors: rest  PRECAUTIONS: Fall and Other: Recent colectomy-pt reports no restrictions Pt has history of osteoporosis and compression fractures RED FLAGS: None   WEIGHT BEARING RESTRICTIONS: No  FALLS: Has patient fallen in last 6 months? No  LIVING ENVIRONMENT: Lives with: lives with their family Lives in: House/apartment Stairs: long ramp in/out of home Has following equipment at home: Vannie - 2 wheeled and Wheelchair (manual)  PLOF: Needs assistance with gait and Vocation/Vocational requirements: retired runner, broadcasting/film/video, tree surgeon; enjoys spending time with grandchildren in [place] and goes to [employer] weekly  PATIENT GOALS: To get stronger, to get back  on a cane.  To use walker  to get to the car (long ramp), get RLE into car better  OBJECTIVE:    TODAY'S TREATMENT: 05/24/2024 Activity Comments  Gait into/out of clinic session 60 ft with 4WW supervision  At counter:   Sidestepping R and L Forward/back walking  30-40 sec bouts, 2 reps  At counter:  Quarter turns R and L 90 and 180 degrees, with 1 UE support for improved turns at counter at home; unsteadiness with LLE as trailing leg  Seated leg strengthening LAQ 2 x 10 March 2 x 10, assist with RLE Hamstring curls blue band 2 x 10 4#         *Pt plans to call [facility] [employer] today to schedule tour and ask about options for continued fitness/trainers.   Access Code: [email or web address] URL: [email or web address] Date: 05/24/2024 Prepared by: [facility] - Outpatient  Rehab - [facility]  Program Notes Seated leg slides out and in;  have a pillow case under your right foot-slide your foot out and in, 3 x 10 reps.  You can have a 3-5# ankle weight on your ankle  Exercises - Seated Hip Adduction Isometrics with Ball  - 1 x daily - 7 x weekly - 3 sets - 10 reps - Seated Hip Abduction with Resistance  - 1 x daily - 7 x weekly - 3 sets - 10 reps - Sit to Stand with Resistance Around Legs  - 1 x daily - 7 x weekly - 3 sets - 5 reps - Seated Shoulder Row with Anchored Resistance  - 1 x daily - 5 x weekly - 2 sets - 10 reps - Seated Anti-Rotation Press With Anchored Resistance  - 1 x daily - 7 x weekly - 3 sets - 10 reps - Seated Hamstring Curl with Anchored Resistance  - 1 x daily - 4 x weekly - 3 sets - 10 reps - Supine Quad Set  - 1 x daily - 5 x weekly - 3 sets - 10 reps - 3 sec hold - Short Arc Quad with Ankle Weight  - 1 x daily - 4 x weekly - 3 sets - 10 reps - Standing Gluteal Sets  - 1 x daily - 7 x weekly - 2 sets - 10 reps - 3 sec hold - Standing Quarter Turn with Counter Support  - 1 x daily - 5 x weekly - 1 sets - 3-5 reps     PATIENT EDUCATION: Education  details: Updates to HEP, POC and plan for discharge next week, transition to community fitness/trainer guided fitness Person educated: Patient Education method: Explanation, Demonstration, and Handouts Education comprehension: verbalized understanding and returned demonstration        --------------------------------------------------- Note: Objective measures were completed at Evaluation unless otherwise noted.  DIAGNOSTIC FINDINGS: MRI 09/2023   IMPRESSION: 1. Postoperative changes of T11-S1 laminectomy and posterior spinal fusion with right posterior spinal instrumentation from L2-L5. On prior CT, there is evidence of fracture through the left posterior fusion mass at L4-5, correlating with edema is seen in the left pedicle of L4 on today's study. 2. Decreased edema within the L4 and L5 vertebral bodies and posterior elements, as well as within the L4-5 and L3-4 discs, likely reactive and related to the above fracture. 3. Loculated simple fluid within the thecal sac with some marginalized cauda equina nerve roots, most consistent with arachnoiditis. 4. No spinal canal stenosis or neural foraminal narrowing.  COGNITION: Overall cognitive status: History of cognitive impairments - at baseline   SENSATION: Light touch:  Impaired  and to light touch-see below Reports some numbness L foot  POSTURE: Leg length diffierence  LOWER EXTREMITY ROM:     Active  Right Eval Left Eval  Hip flexion    Hip extension    Hip abduction    Hip adduction    Hip internal rotation    Hip external rotation    Knee flexion    Knee extension    Ankle dorsiflexion    Ankle plantarflexion    Ankle inversion    Ankle eversion     (Blank rows = not tested)  LOWER EXTREMITY MMT:    MMT Right Eval Left Eval  Hip flexion 3- 3+  Hip extension    Hip abduction 4 4  Hip adduction 4 4  Hip internal rotation    Hip external rotation    Knee flexion 4 4  Knee extension 3+ 4  Ankle  dorsiflexion 3+ 3+  Ankle plantarflexion    Ankle inversion    Ankle eversion    (Blank rows = not tested)   TRANSFERS: Sit to stand: SBA  Assistive device utilized: Environmental Consultant - 2 wheeled     Stand to sit: SBA  Assistive device utilized: Environmental Consultant - 2 wheeled      GAIT: Findings: Gait Characteristics: excess lateral weightshift through trunk due to significant leg length difference and step through pattern, Distance walked: 50 ft, Assistive device utilized:Walker - 2 wheeled, Level of assistance: CGA, and Comments: NA  FUNCTIONAL TESTS:  5 times sit to stand: 14.44 sec with BUE support Timed up and go (TUG): 24.53 sec 2 minute walk test: NT 10 meter walk test: 21.34 sec (1.54 ft/sec) TUG cognitive: (naming colors) 28.62 sec 75M walk back:  42.16 sec with RW Stroop gait:  13.25 sec in 10 ft (naming color words)-0.75 ft/sec   18.75 sec in 10 feet (naming printed colors)-0.53 ft/sec                                                                          PATIENT EDUCATION: Education details: See above. Person educated: Patient Education method: Explanation Education comprehension: verbalized understanding  HOME EXERCISE PROGRAM: Not yet initiated Did perform eccentric hip flexion/extension in sitting  GOALS: Goals reviewed with patient? Yes  SHORT TERM GOALS: Target date: 02/24/2024  Pt will be independent with HEP for improved strength, balance, gait. Baseline: printed out and reviewed 02/23/24 Goal status: MET 02/23/24  2.  Pt will improve TUG score to less than or equal to 18 sec for decreased fall risk. Baseline: 24.53 sec; 14.54 sec with 4WW 02/23/24 Goal status: MET 02/23/24  3.  Pt will improve 3 M Walk backwards to less than or equal to 30 seconds for decreased fall risk. Baseline: 42.16 sec; 9.96 sec 02/23/24 Goal status: MET 02/23/24  4.  Pt will report at least 50% improvement in car transfers.  Baseline:  difficulty with RLE; Patient reports that she has been able to swing  her LEs into the car by herself- reports 50% improvement. 02/23/24  Goal status:  MET 02/23/24  LONG TERM GOALS: Target date: 06/07/2024    Pt will be independent with HEP for improved strength, balance, gait. Baseline:  Goal status: IN PROGRESS  2.  Pt will improve gait velocity to at least 1.8 ft/sec for improved gait efficiency and safety. Baseline: 1.54 ft/sec; 1.8 ft/sec w/ 5TT Goal status: MET  3.  Pt will improve gait velocity with dual task to at least 1 ft/sec for improved gait and decreased fall risk. Baseline: 0.53-0.75 ft/sec with Stroop over 10 ft Goal status: IN PROGRESS  4.  Pt will improve 5x sit<>stand to less than or equal to 11.5 sec with light UE support to demonstrate improved functional strength and transfer efficiency. Baseline: 14.44 sec; 13.9 sec Goal status: IN PROGRESS 04/19/24  5.  Pt will verbalize plans for continued community fitness upon d/c from PT to maximize gains made in PT. Baseline:  Goal status: IN PROGRESS  6.  Pt will ambulate at least 200 ft in 2 minute walk, for improved gait efficiency and endurance.  Baseline: 100 ft in 1:30; 215 ft w/ 4WW x 2 min  Goal status:  MET   7.  Demo/report ability to negotiate ramp at her home w/ RW and load/unload AD into care to enable ability to enter/leave home  Baseline: unable  Goal status: INITIAL  8.  Demo ability to ambulate up to 300 m (984 ft) for community-level ambulation w/ AD  Baseline: 215 ft w/ 5TT  Goal status: INITIAL  ASSESSMENT:  CLINICAL IMPRESSION: Pt presents today with less complaints of pain and reports pool therapy session went well.  Worked on some standing balance exercises today at counter, similar to what she has done in pool.  She is able to tolerate 2 bouts of intervals of sidestepping and forward/back walking.  Also addressed turns from counter, as pt reports this gets her off balance at home.  Able to progress to 4# weight and blue band with seated there ex.  Pt pleased  with session, no complaints at end and was able to updrade HEP. Anticipate checking LTGs and planning for discharge next week.   OBJECTIVE IMPAIRMENTS: Abnormal gait, decreased balance, decreased mobility, difficulty walking, decreased strength, impaired sensation, postural dysfunction, and pain.   ACTIVITY LIMITATIONS: sitting, standing, transfers, locomotion level, and caring for others  PARTICIPATION LIMITATIONS: driving, shopping, community activity, and travel  PERSONAL FACTORS: 3+ comorbidities: see PMH above are also affecting patient's functional outcome.   REHAB POTENTIAL: Good  CLINICAL DECISION MAKING: Evolving/moderate complexity  EVALUATION COMPLEXITY: Moderate  PLAN:  PT FREQUENCY: 2x/week  PT DURATION: 6 weeks   PLANNED INTERVENTIONS: [id number]- PT Re-evaluation, 97750- Physical Performance Testing, 97110-Therapeutic exercises, 97530- Therapeutic activity, [id number]- Neuromuscular re-education, 97535- Self Care, [id number]- Manual therapy, [id number]- Gait training, [phone]- Canalith repositioning, [phone]- Aquatic Therapy, Patient/Family education, and Balance training  PLAN FOR NEXT SESSION: .  Next week is last in POC and plan for d/c.  Ask about back pain and progress standing and gait tolerance; RLE strength for car transfers; pt requests not to perform Nustep and physioball activities d/t hip pain; pt reports that she would like to work towards being able to walk down her ramp and into her car-work on outdoor sidewalk distance to simulate ramp and lifting/loading AD in/out of car  AQUATICS: Frequency: 1-2x/week Duration: 6 weeks  Special Instruction: chronic hip and back pain, imbalance   Aquatics:  continue core work - try ankle weights/aquatic cuffs in sitting  Greig Anon, PT 05/24/24 12:44 PM Phone: [phone] Fax: [phone]  [facility] at [facility] [address], [address] [place], [place] [address] Phone # [phone] Fax # [phone]

## 2024-05-29 ENCOUNTER — Ambulatory Visit: Admitting: Rehabilitation

## 2024-05-29 ENCOUNTER — Encounter: Payer: Self-pay | Admitting: Rehabilitation

## 2024-05-29 DIAGNOSIS — R2681 Unsteadiness on feet: Secondary | ICD-10-CM | POA: Diagnosis not present

## 2024-05-29 DIAGNOSIS — M6281 Muscle weakness (generalized): Secondary | ICD-10-CM

## 2024-05-29 DIAGNOSIS — R2689 Other abnormalities of gait and mobility: Secondary | ICD-10-CM

## 2024-05-29 NOTE — Therapy (Signed)
 OUTPATIENT PHYSICAL THERAPY NEURO TREATMENT NOTE   Patient Name: [name] MRN: [id number] DOB:1955/07/30, 68 y.o., female Today's Date: 05/29/2024   PCP: Rolinda Millman, MD  REFERRING PROVIDER: Urbano Albright, MD        END OF SESSION:  PT End of Session - 05/29/24 0815     Visit Number 25    Number of Visits 31    Date for Recertification  06/07/24    Authorization Type BCBS Medicare    Progress Note Due on Visit 28    PT Start Time 1103    PT Stop Time 1150    PT Time Calculation (min) 47 min    Equipment Utilized During Treatment Other (comment)    Activity Tolerance Patient tolerated treatment well    Behavior During Therapy WFL for tasks assessed/performed               Past Medical History:  Diagnosis Date   Anemia    Anxiety    Arthritis    Bipolar disorder (HCC)    Cardiomyopathy (HCC)    Complication of anesthesia    Awake but could not move   Depression    Diskitis 10/04/2023   GERD (gastroesophageal reflux disease)    Heart failure (HCC)    Acute   Heart murmur    related to VSD   High cholesterol    History of blood transfusion    related to OR (08/19/2016)   History of hiatal hernia    Hypertension    Hyperthyroidism    Hypotension 09/06/2023   Mild cognitive impairment 09/06/2018   Paroxysmal ventricular tachycardia (HCC)    Pneumonia    Rectovaginal fistula 09/06/2023   Tachycardia, unspecified    Type II diabetes mellitus (HCC)    UTI (urinary tract infection)    being treated with Keflex    Ventricular septal defect    Ventriculitis of brain due to bacteria 11/02/2022   Vertebral fracture, osteoporotic (HCC) 01/06/2023   Past Surgical History:  Procedure Laterality Date   ABDOMINAL HYSTERECTOMY     BACK SURGERY     CARDIAC CATHETERIZATION N/A 06/23/2015   Procedure: Left Heart Cath and Coronary Angiography;  Surgeon: Ezra GORMAN Shuck, MD;  Location: [facility] INVASIVE CV LAB;  Service: Cardiovascular;   Laterality: N/A;   CARDIAC CATHETERIZATION  1960   VSD was so small; didn't need repaired   COLECTOMY, SIGMOID, ROBOT-ASSISTED N/A 11/30/2023   Procedure: COLECTOMY, SIGMOID, ROBOT-ASSISTED ROBOTIC LOW ANTERIOR RECTOSIGMOID RESECTION (LAR) WITH ANASTOMOSIS COLOVAGINAL FISTULA TAKEDOWN & REPAIR OMENTOPEXY OF VAGINA INTRAOPERATIVE ASSESSMENT OF TISSUE VASCULAR PERFUSION USING ICG (indocyanine green ) IMMUNOFLUORESCENCE TRANSVERSUS ABDOMINIS PLANE (TAP) BLOCK - BILATERAL;  Surgeon: Sheldon Standing, MD;  Location: [facility] ORS;  Service: General;  Late   CYSTOSCOPY WITH INDOCYANINE GREEN  IMAGING (ICG) N/A 11/30/2023   Procedure: Cystoscopy with bilateral ureteral FireFly injections Transurethral resection of bladder tumor--large;  Surgeon: Carolee Sherwood JONETTA DOUGLAS, MD;  Location: [facility];  Service: Urology;  Laterality: N/A;   EXAM UNDER ANESTHESIA WITH MANIPULATION OF HIP Right 06/02/2014   dr sheril   FLEXIBLE SIGMOIDOSCOPY N/A 11/30/2023   Procedure: KINGSTON SIDE;  Surgeon: Sheldon Standing, MD;  Location: [facility];  Service: General;  Laterality: N/A;   FRACTURE SURGERY     HERNIA REPAIR     HIP CLOSED REDUCTION Right 06/02/2014   Procedure: CLOSED MANIPULATION HIP;  Surgeon: Maude KANDICE Sheril, MD;  Location: [facility] OR;  Service: Orthopedics;  Laterality: Right;   IR FLUORO GUIDED NEEDLE PLC ASPIRATION/INJECTION  LOC  08/09/2023   JOINT REPLACEMENT     JOINT REPLACEMENT     POSTERIOR LUMBAR FUSION 4 LEVEL N/A 06/26/2022   Procedure: Lumbar One To Lumbar Five Posterior Instrumented Fusion;  Surgeon: Cheryle Debby LABOR, MD;  Location: [facility] OR;  Service: Neurosurgery;  Laterality: N/A;   REFRACTIVE SURGERY Bilateral    SHOULDER ARTHROSCOPY Right    SHOULDER OPEN ROTATOR CUFF REPAIR Right    SPINAL FUSION  1996   t10 down to my coccyx   SPINE HARDWARE REMOVAL     TOTAL ABDOMINAL HYSTERECTOMY     TOTAL HIP ARTHROPLASTY Right 05/10/2014   [place]      by dr lonni olcott   TOTAL KNEE ARTHROPLASTY  Left    TOTAL SHOULDER ARTHROPLASTY Left 08/19/2016   Procedure: TOTAL SHOULDER ARTHROPLASTY;  Surgeon: Eva Herring, MD;  Location: [facility] OR;  Service: Orthopedics;  Laterality: Left;  Left total shoulder replacement   Patient Active Problem List   Diagnosis Date Noted   COPD (chronic obstructive pulmonary disease) (HCC) 04/05/2024   Colovaginal fistula 11/30/2023   Diskitis 10/04/2023   Hypotension 09/06/2023   Fracture of tenth thoracic vertebra (HCC) 09/05/2023   History of discitis 09/05/2023   Senile osteoporosis 09/05/2023   History of urinary retention 09/05/2023   Vaginal atrophy 09/05/2023   Nocturia 09/05/2023   Vulvar ulcer 09/05/2023   Pulmonary nodule 08/02/2023   Non-traumatic compression fracture of vertebral column (HCC) 06/24/2023   Nonobstructive atherosclerosis of coronary artery 02/10/2023   Bilateral pseudophakia 02/03/2023   Vertebral fracture, osteoporotic (HCC) 01/06/2023   Ventriculitis of brain due to bacteria 11/02/2022   Anemia of chronic illness 10/12/2022   Bipolar disorder, in partial remission, most recent episode depressed (HCC) 10/07/2022   Adjustment disorder with mixed anxiety and depressed mood 09/30/2022   Chronic wound 09/25/2022   Hyperglycemia 09/25/2022   Chronic midline thoracic back pain 09/17/2022   Primary insomnia 09/17/2022   Chronic combined systolic and diastolic CHF (congestive heart failure) (HCC) 09/14/2022   Acute blood loss anemia 09/09/2022   Infection of lumbar spine (HCC) 09/03/2022   Malnutrition of moderate degree 08/20/2022   Hardware complicating wound infection 08/20/2022   Infection of deep incisional surgical site after procedure 08/20/2022   Aspiration pneumonia of both lungs (HCC) 08/19/2022   Acute respiratory failure with hypoxia (HCC) 08/19/2022   Sepsis (HCC) 08/19/2022   Seizure (HCC) 08/19/2022   Wound dehiscence 08/09/2022   Delayed surgical wound healing 07/08/2022   Fracture of lumbar spine without  cord injury (HCC) 06/26/2022   Lumbar vertebral fracture (HCC) 06/25/2022   Mood disorder 09/29/2021   Generalized muscle weakness 04/30/2019   Mild cognitive impairment 09/06/2018   Hyponatremia 02/28/2018   S/P shoulder replacement, left 08/19/2016   Anxiety 10/17/2014   Acid reflux 10/17/2014   BP (high blood pressure) 10/17/2014   Arthritis, degenerative 10/17/2014   Adult hypothyroidism 10/17/2014   UTI (urinary tract infection) 06/03/2014   Fracture of bone adjacent to prosthesis 06/03/2014   Peri-prosthetic fracture of femur following total hip arthroplasty 06/02/2014   Urinary retention 05/13/2014   Chronic pain 05/10/2014   History of hip surgery 05/10/2014   Osteoarthritis 09/30/2011   UNSPECIFIED HEART FAILURE 06/24/2010   UNSPECIFIED CONGENITAL DEFECT OF SEPTAL CLOSURE 06/24/2010   Secondary cardiomyopathy (HCC) 03/18/2010   Tobacco use 03/18/2010   DM 06/13/2009   HYPERTENSION, UNSPECIFIED 06/13/2009   VENTRICULAR TACHYCARDIA 06/13/2009   VENTRICULAR SEPTAL DEFECT, CONGENITAL 06/13/2009   Essential (primary) hypertension 06/13/2009   Diabetes  mellitus type 2, noninsulin dependent (HCC) 06/13/2009    ONSET DATE: 01/05/2024 MD referral  REFERRING DIAG: R26.9 (ICD-10-CM) - Gait difficulty   THERAPY DIAG:  Unsteadiness on feet  Other abnormalities of gait and mobility  Muscle weakness (generalized)  Rationale for Evaluation and Treatment: Rehabilitation  SUBJECTIVE:                                                                                                                                                                                             SUBJECTIVE STATEMENT: Pt reports she is feeling good, somewhat tired from [employer] over the weekend.     Pt accompanied by: self and caregiver  PERTINENT HISTORY: Chronic osteomyelitis with worsening back pain; Hx of multiple R hip fractures with surgery to revisions, significant leg length difference with LLE  longer than RLE, hx of infection post surgery in spine (treated on antibiotics) Per MD Note 01/09/2024:    Patient was seen by Dr. Elspeth Schultze who scheduled her for surgery and on  12/01/2023 she underwent   -ROBOTIC LOW ANTERIOR RECTOSIGMOID RESECTION (LAR) WITH ANASTOMOSIS -COLOVAGINAL FISTULA TAKEDOWN & REPAIR -OMENTOPEXY OF VAGINA -INTRAOPERATIVE ASSESSMENT OF TISSUE VASCULAR PERFUSION USING ICG (indocyanine green ) IMMUNOFLUORESCENCE -TRANSVERSUS ABDOMINIS PLANE (TAP) BLOCK - BILATERAL -FLEXIBLE SIGMOIDOSCOPY    Urology did joint surgery and did    1.  Cystoscopy with bilateral ureteral FireFly injections 2.  Transurethral resection of  possible bladder tumor--large   Pathology came back with:   A. BLADDER TUMOR, TRANSURETHERAL RESECTON:  Florid cystitis cystica and papillary cystitis  Negative for carcinoma   B. RECTOSIGMOID COLON RESECTION:  Diverticula with chronic inflammation, perforation, fistula and reactive  fibrosis  Negative for carcinoma  Five benign lymph nodes (0/5)   C. FINAL DISTAL MARGIN:  Benign colonic tissue         PAIN:  Are you having pain? Yes: NPRS scale: 5/10 Pain location: R hip/back Pain description: throbbing, grinding, sharp Aggravating factors: more activities, [place] Relieving factors: rest  PRECAUTIONS: Fall and Other: Recent colectomy-pt reports no restrictions Pt has history of osteoporosis and compression fractures RED FLAGS: None   WEIGHT BEARING RESTRICTIONS: No  FALLS: Has patient fallen in last 6 months? No  LIVING ENVIRONMENT: Lives with: lives with their family Lives in: House/apartment Stairs: long ramp in/out of home Has following equipment at home: Vannie - 2 wheeled and Wheelchair (manual)  PLOF: Needs assistance with gait and Vocation/Vocational requirements: retired runner, broadcasting/film/video, tree surgeon; enjoys spending time with grandchildren in [place] and goes to [employer] weekly  PATIENT GOALS: To get  stronger, to get back on a  cane.  To use walker to get to the car (long ramp), get RLE into car better  OBJECTIVE:   TODAY'S TREATMENT: 05/22/24  Aquatic therapy at [facility] - pool temperature 92 degrees   Patient seen for aquatic therapy today.  Treatment took place in water  3.6-4.8 feet deep depending upon activity.  Patient entered and exited the pool via stairs using step to pattern w/ Bil rails at SBA level.      Access Code: [email or web address] URL: [email or web address] Date: 05/29/2024 Prepared by: Damien Fought  Exercises - Forward Walking  - 1 x daily - 7 x weekly - 4 sets - 20 reps - Backward Walking  - 1 x daily - 7 x weekly - 4 sets - 20 reps - Side Stepping  - 1 x daily - 7 x weekly - 4 sets - 20 reps - Forward March with Opposite Arm Knee Taps and Hand Floats  - 1 x daily - 7 x weekly - 4 sets - 20 reps - Standing Hip Flexion Extension at [employer]  - 1 x daily - 7 x weekly - 1 sets - 10 reps - Standing Hip Abduction Adduction at [employer]  - 1 x daily - 7 x weekly - 1 sets - 10 reps - Sit to Stand Without Arm Support  - 1 x daily - 7 x weekly - 1 sets - 10 reps - Flutter Kick at [employer]  - 1 x daily - 7 x weekly - 3 sets - 10 reps - Side to Side Hamstring Stretch with Noodle at [employer]  - 1 x daily - 7 x weekly - 3 sets - 10 reps - Sitting Balance on Pool Noodle  - 1 x daily - 7 x weekly - 1 sets - 3 reps - 20-30 hold  Performed the following exercises during our session and provided laminated handout at end for pt to take to pool with her.    Patient requires buoyancy of the water  for support for reduced fall risk with gait training and balance exercises with SBA support and verbal cuing and return demonstration. Exercises able to be performed safely in water  without the risk of fall compared to those same exercises performed on land; viscosity of water  needed for resistance for strengthening. Current of water  provides perturbations for challenging static and  dynamic balance.        Access Code: [email or web address] URL: [email or web address] Date: 03/27/2024 Prepared by: [facility]  Program Notes Seated leg slides out and in;  have a pillow case under your right foot-slide your foot out and in, 3 x 10 reps.  You can have a 3-5# ankle weight on your ankle  Exercises - Seated Hip Adduction Isometrics with Ball  - 1 x daily - 7 x weekly - 3 sets - 10 reps - Seated Hip Abduction with Resistance  - 1 x daily - 7 x weekly - 3 sets - 10 reps - Sit to Stand with Resistance Around Legs  - 1 x daily - 7 x weekly - 3 sets - 5 reps - Seated Shoulder Row with Anchored Resistance  - 1 x daily - 5 x weekly - 2 sets - 10 reps - Seated Anti-Rotation Press With Anchored Resistance  - 1 x daily - 7 x weekly - 3 sets - 10 reps - Seated Hamstring Curl with Anchored Resistance  - 1 x daily - 4 x weekly - 3 sets - 10 reps -  Supine Quad Set  - 1 x daily - 5 x weekly - 3 sets - 10 reps - 3 sec hold - Short Arc Quad with Ankle Weight  - 1 x daily - 4 x weekly - 3 sets - 10 reps  PATIENT EDUCATION: Education details: aquatic rationale  Person educated: Patient Education method: Explanation, Demonstration, and Handouts Education comprehension: verbalized understanding and returned demonstration        --------------------------------------------------- Note: Objective measures were completed at Evaluation unless otherwise noted.  DIAGNOSTIC FINDINGS: MRI 09/2023   IMPRESSION: 1. Postoperative changes of T11-S1 laminectomy and posterior spinal fusion with right posterior spinal instrumentation from L2-L5. On prior CT, there is evidence of fracture through the left posterior fusion mass at L4-5, correlating with edema is seen in the left pedicle of L4 on today's study. 2. Decreased edema within the L4 and L5 vertebral bodies and posterior elements, as well as within the L4-5 and L3-4 discs, likely reactive and  related to the above fracture. 3. Loculated simple fluid within the thecal sac with some marginalized cauda equina nerve roots, most consistent with arachnoiditis. 4. No spinal canal stenosis or neural foraminal narrowing.  COGNITION: Overall cognitive status: History of cognitive impairments - at baseline   SENSATION: Light touch: Impaired  and to light touch-see below Reports some numbness L foot  POSTURE: Leg length diffierence  LOWER EXTREMITY ROM:     Active  Right Eval Left Eval  Hip flexion    Hip extension    Hip abduction    Hip adduction    Hip internal rotation    Hip external rotation    Knee flexion    Knee extension    Ankle dorsiflexion    Ankle plantarflexion    Ankle inversion    Ankle eversion     (Blank rows = not tested)  LOWER EXTREMITY MMT:    MMT Right Eval Left Eval  Hip flexion 3- 3+  Hip extension    Hip abduction 4 4  Hip adduction 4 4  Hip internal rotation    Hip external rotation    Knee flexion 4 4  Knee extension 3+ 4  Ankle dorsiflexion 3+ 3+  Ankle plantarflexion    Ankle inversion    Ankle eversion    (Blank rows = not tested)   TRANSFERS: Sit to stand: SBA  Assistive device utilized: Environmental Consultant - 2 wheeled     Stand to sit: SBA  Assistive device utilized: Environmental Consultant - 2 wheeled      GAIT: Findings: Gait Characteristics: excess lateral weightshift through trunk due to significant leg length difference and step through pattern, Distance walked: 50 ft, Assistive device utilized:Walker - 2 wheeled, Level of assistance: CGA, and Comments: NA  FUNCTIONAL TESTS:  5 times sit to stand: 14.44 sec with BUE support Timed up and go (TUG): 24.53 sec 2 minute walk test: NT 10 meter walk test: 21.34 sec (1.54 ft/sec) TUG cognitive: (naming colors) 28.62 sec 63M walk back:  42.16 sec with RW Stroop gait:  13.25 sec in 10 ft (naming color words)-0.75 ft/sec   18.75 sec in 10 feet (naming printed colors)-0.53 ft/sec  PATIENT EDUCATION: Education details: See above. Person educated: Patient Education method: Explanation Education comprehension: verbalized understanding  HOME EXERCISE PROGRAM: Not yet initiated Did perform eccentric hip flexion/extension in sitting  GOALS: Goals reviewed with patient? Yes  SHORT TERM GOALS: Target date: 02/24/2024  Pt will be independent with HEP for improved strength, balance, gait. Baseline: printed out and reviewed 02/23/24 Goal status: MET 02/23/24  2.  Pt will improve TUG score to less than or equal to 18 sec for decreased fall risk. Baseline: 24.53 sec; 14.54 sec with 4WW 02/23/24 Goal status: MET 02/23/24  3.  Pt will improve 3 M Walk backwards to less than or equal to 30 seconds for decreased fall risk. Baseline: 42.16 sec; 9.96 sec 02/23/24 Goal status: MET 02/23/24  4.  Pt will report at least 50% improvement in car transfers.  Baseline:  difficulty with RLE; Patient reports that she has been able to swing her LEs into the car by herself- reports 50% improvement. 02/23/24  Goal status:  MET 02/23/24  LONG TERM GOALS: Target date: 06/07/2024    Pt will be independent with HEP for improved strength, balance, gait. Baseline:  Goal status: IN PROGRESS  2.  Pt will improve gait velocity to at least 1.8 ft/sec for improved gait efficiency and safety. Baseline: 1.54 ft/sec; 1.8 ft/sec w/ 5TT Goal status: MET  3.  Pt will improve gait velocity with dual task to at least 1 ft/sec for improved gait and decreased fall risk. Baseline: 0.53-0.75 ft/sec with Stroop over 10 ft Goal status: IN PROGRESS  4.  Pt will improve 5x sit<>stand to less than or equal to 11.5 sec with light UE support to demonstrate improved functional strength and transfer efficiency. Baseline: 14.44 sec; 13.9 sec Goal status: IN PROGRESS 04/19/24  5.  Pt will verbalize plans for continued community fitness upon d/c from PT to maximize gains  made in PT. Baseline:  Goal status: IN PROGRESS  6.  Pt will ambulate at least 200 ft in 2 minute walk, for improved gait efficiency and endurance.  Baseline: 100 ft in 1:30; 215 ft w/ 4WW x 2 min  Goal status:  MET   7.  Demo/report ability to negotiate ramp at her home w/ RW and load/unload AD into care to enable ability to enter/leave home  Baseline: unable  Goal status: INITIAL  8.  Demo ability to ambulate up to 300 m (984 ft) for community-level ambulation w/ AD  Baseline: 215 ft w/ 5TT  Goal status: INITIAL  ASSESSMENT:  CLINICAL IMPRESSION: Provided pt with HEP for pool today and reviewed in session.  Pt tolerated exercises well and is ready for DC.  Discussed and texted pt adjustable heel wedge that we had talked about in previous session to slightly assist with reducing pain in low back/side.    OBJECTIVE IMPAIRMENTS: Abnormal gait, decreased balance, decreased mobility, difficulty walking, decreased strength, impaired sensation, postural dysfunction, and pain.   ACTIVITY LIMITATIONS: sitting, standing, transfers, locomotion level, and caring for others  PARTICIPATION LIMITATIONS: driving, shopping, community activity, and travel  PERSONAL FACTORS: 3+ comorbidities: see PMH above are also affecting patient's functional outcome.   REHAB POTENTIAL: Good  CLINICAL DECISION MAKING: Evolving/moderate complexity  EVALUATION COMPLEXITY: Moderate  PLAN:  PT FREQUENCY: 2x/week  PT DURATION: 6 weeks   PLANNED INTERVENTIONS: [id number]- PT Re-evaluation, 97750- Physical Performance Testing, 97110-Therapeutic exercises, 97530- Therapeutic activity, [id number]- Neuromuscular re-education, 97535- Self Care, [id number]- Manual therapy, [id number]- Gait training, [phone]- Canalith repositioning, [id number]- Aquatic Therapy, Patient/Family education,  and Balance training  PLAN FOR NEXT SESSION: .  Ask about back pain and progress standing and gait tolerance; RLE strength for car transfers; pt requests not  to perform Nustep and physioball activities d/t hip pain; pt reports that she would like to work towards being able to walk down her ramp and into her car-work on outdoor sidewalk distance to simulate ramp and lifting/loading AD in/out of car  AQUATICS: Frequency: 1-2x/week Duration: 6 weeks  Special Instruction: chronic hip and back pain, imbalance   Aquatics:  continue core work - try ankle weights/aquatic cuffs in sitting  Damien Fought, PT, MPT [facility] [address] [place], [place], [address] Phone: [phone]   Fax:  [phone] 05/29/24, 1:07 PM

## 2024-05-30 ENCOUNTER — Ambulatory Visit

## 2024-05-31 ENCOUNTER — Ambulatory Visit: Admitting: Physical Therapy

## 2024-05-31 ENCOUNTER — Encounter: Payer: Self-pay | Admitting: Physical Therapy

## 2024-05-31 DIAGNOSIS — R2681 Unsteadiness on feet: Secondary | ICD-10-CM

## 2024-05-31 DIAGNOSIS — M6281 Muscle weakness (generalized): Secondary | ICD-10-CM

## 2024-05-31 DIAGNOSIS — R2689 Other abnormalities of gait and mobility: Secondary | ICD-10-CM

## 2024-05-31 NOTE — Therapy (Signed)
 OUTPATIENT PHYSICAL THERAPY NEURO TREATMENT NOTE/DISCHARGE SUMMARY   Patient Name: [name] MRN: [id number] DOB:1956/01/03, 68 y.o., female Today's Date: 05/31/2024   PCP: Rolinda Millman, MD  REFERRING PROVIDER: Urbano Albright, MD     PHYSICAL THERAPY DISCHARGE SUMMARY  Visits from Start of Care: 26  Current functional level related to goals / functional outcomes: Pt has met 5 of 8 LTGs.  See below for details   Remaining deficits: Posture, strength, balance   Education / Equipment: HEP and community fitness plans   Patient agrees to discharge. Patient goals were partially met. Patient is being discharged due to being pleased with the current functional level.    END OF SESSION:  PT End of Session - 05/31/24 1100     Visit Number 26    Number of Visits 31    Date for Recertification  06/07/24    Authorization Type BCBS Medicare    Progress Note Due on Visit 28    PT Start Time 1101    PT Stop Time 1142    PT Time Calculation (min) 41 min    Equipment Utilized During Treatment Other (comment)   close supervision   Activity Tolerance Patient tolerated treatment well    Behavior During Therapy WFL for tasks assessed/performed                Past Medical History:  Diagnosis Date   Anemia    Anxiety    Arthritis    Bipolar disorder (HCC)    Cardiomyopathy (HCC)    Complication of anesthesia    Awake but could not move   Depression    Diskitis 10/04/2023   GERD (gastroesophageal reflux disease)    Heart failure (HCC)    Acute   Heart murmur    related to VSD   High cholesterol    History of blood transfusion    related to OR (08/19/2016)   History of hiatal hernia    Hypertension    Hyperthyroidism    Hypotension 09/06/2023   Mild cognitive impairment 09/06/2018   Paroxysmal ventricular tachycardia (HCC)    Pneumonia    Rectovaginal fistula 09/06/2023   Tachycardia, unspecified    Type II diabetes mellitus (HCC)    UTI  (urinary tract infection)    being treated with Keflex    Ventricular septal defect    Ventriculitis of brain due to bacteria 11/02/2022   Vertebral fracture, osteoporotic (HCC) 01/06/2023   Past Surgical History:  Procedure Laterality Date   ABDOMINAL HYSTERECTOMY     BACK SURGERY     CARDIAC CATHETERIZATION N/A 06/23/2015   Procedure: Left Heart Cath and Coronary Angiography;  Surgeon: Ezra GORMAN Shuck, MD;  Location: [facility] INVASIVE CV LAB;  Service: Cardiovascular;  Laterality: N/A;   CARDIAC CATHETERIZATION  1960   VSD was so small; didn't need repaired   COLECTOMY, SIGMOID, ROBOT-ASSISTED N/A 11/30/2023   Procedure: COLECTOMY, SIGMOID, ROBOT-ASSISTED ROBOTIC LOW ANTERIOR RECTOSIGMOID RESECTION (LAR) WITH ANASTOMOSIS COLOVAGINAL FISTULA TAKEDOWN & REPAIR OMENTOPEXY OF VAGINA INTRAOPERATIVE ASSESSMENT OF TISSUE VASCULAR PERFUSION USING ICG (indocyanine green ) IMMUNOFLUORESCENCE TRANSVERSUS ABDOMINIS PLANE (TAP) BLOCK - BILATERAL;  Surgeon: Sheldon Standing, MD;  Location: [facility] ORS;  Service: General;  Late   CYSTOSCOPY WITH INDOCYANINE GREEN  IMAGING (ICG) N/A 11/30/2023   Procedure: Cystoscopy with bilateral ureteral FireFly injections Transurethral resection of bladder tumor--large;  Surgeon: Carolee Sherwood JONETTA DOUGLAS, MD;  Location: [facility];  Service: Urology;  Laterality: N/A;   EXAM UNDER ANESTHESIA WITH MANIPULATION OF HIP Right 06/02/2014  dr sheril SIDE SIGMOIDOSCOPY N/A 11/30/2023   Procedure: KINGSTON SIDE;  Surgeon: Sheldon Standing, MD;  Location: [facility];  Service: General;  Laterality: N/A;   FRACTURE SURGERY     HERNIA REPAIR     HIP CLOSED REDUCTION Right 06/02/2014   Procedure: CLOSED MANIPULATION HIP;  Surgeon: Maude KANDICE Sheril, MD;  Location: [facility] OR;  Service: Orthopedics;  Laterality: Right;   IR FLUORO GUIDED NEEDLE PLC ASPIRATION/INJECTION LOC  08/09/2023   JOINT REPLACEMENT     JOINT REPLACEMENT     POSTERIOR LUMBAR FUSION 4 LEVEL N/A 06/26/2022   Procedure: Lumbar  One To Lumbar Five Posterior Instrumented Fusion;  Surgeon: Cheryle Debby LABOR, MD;  Location: [facility] OR;  Service: Neurosurgery;  Laterality: N/A;   REFRACTIVE SURGERY Bilateral    SHOULDER ARTHROSCOPY Right    SHOULDER OPEN ROTATOR CUFF REPAIR Right    SPINAL FUSION  1996   t10 down to my coccyx   SPINE HARDWARE REMOVAL     TOTAL ABDOMINAL HYSTERECTOMY     TOTAL HIP ARTHROPLASTY Right 05/10/2014   [place]      by dr lonni olcott   TOTAL KNEE ARTHROPLASTY Left    TOTAL SHOULDER ARTHROPLASTY Left 08/19/2016   Procedure: TOTAL SHOULDER ARTHROPLASTY;  Surgeon: Eva Herring, MD;  Location: [facility] OR;  Service: Orthopedics;  Laterality: Left;  Left total shoulder replacement   Patient Active Problem List   Diagnosis Date Noted   COPD (chronic obstructive pulmonary disease) (HCC) 04/05/2024   Colovaginal fistula 11/30/2023   Diskitis 10/04/2023   Hypotension 09/06/2023   Fracture of tenth thoracic vertebra (HCC) 09/05/2023   History of discitis 09/05/2023   Senile osteoporosis 09/05/2023   History of urinary retention 09/05/2023   Vaginal atrophy 09/05/2023   Nocturia 09/05/2023   Vulvar ulcer 09/05/2023   Pulmonary nodule 08/02/2023   Non-traumatic compression fracture of vertebral column (HCC) 06/24/2023   Nonobstructive atherosclerosis of coronary artery 02/10/2023   Bilateral pseudophakia 02/03/2023   Vertebral fracture, osteoporotic (HCC) 01/06/2023   Ventriculitis of brain due to bacteria 11/02/2022   Anemia of chronic illness 10/12/2022   Bipolar disorder, in partial remission, most recent episode depressed (HCC) 10/07/2022   Adjustment disorder with mixed anxiety and depressed mood 09/30/2022   Chronic wound 09/25/2022   Hyperglycemia 09/25/2022   Chronic midline thoracic back pain 09/17/2022   Primary insomnia 09/17/2022   Chronic combined systolic and diastolic CHF (congestive heart failure) (HCC) 09/14/2022   Acute blood loss anemia 09/09/2022   Infection of  lumbar spine (HCC) 09/03/2022   Malnutrition of moderate degree 08/20/2022   Hardware complicating wound infection 08/20/2022   Infection of deep incisional surgical site after procedure 08/20/2022   Aspiration pneumonia of both lungs (HCC) 08/19/2022   Acute respiratory failure with hypoxia (HCC) 08/19/2022   Sepsis (HCC) 08/19/2022   Seizure (HCC) 08/19/2022   Wound dehiscence 08/09/2022   Delayed surgical wound healing 07/08/2022   Fracture of lumbar spine without cord injury (HCC) 06/26/2022   Lumbar vertebral fracture (HCC) 06/25/2022   Mood disorder 09/29/2021   Generalized muscle weakness 04/30/2019   Mild cognitive impairment 09/06/2018   Hyponatremia 02/28/2018   S/P shoulder replacement, left 08/19/2016   Anxiety 10/17/2014   Acid reflux 10/17/2014   BP (high blood pressure) 10/17/2014   Arthritis, degenerative 10/17/2014   Adult hypothyroidism 10/17/2014   UTI (urinary tract infection) 06/03/2014   Fracture of bone adjacent to prosthesis 06/03/2014   Peri-prosthetic fracture of femur following total hip arthroplasty 06/02/2014  Urinary retention 05/13/2014   Chronic pain 05/10/2014   History of hip surgery 05/10/2014   Osteoarthritis 09/30/2011   UNSPECIFIED HEART FAILURE 06/24/2010   UNSPECIFIED CONGENITAL DEFECT OF SEPTAL CLOSURE 06/24/2010   Secondary cardiomyopathy (HCC) 03/18/2010   Tobacco use 03/18/2010   DM 06/13/2009   HYPERTENSION, UNSPECIFIED 06/13/2009   VENTRICULAR TACHYCARDIA 06/13/2009   VENTRICULAR SEPTAL DEFECT, CONGENITAL 06/13/2009   Essential (primary) hypertension 06/13/2009   Diabetes mellitus type 2, noninsulin dependent (HCC) 06/13/2009    ONSET DATE: 01/05/2024 MD referral  REFERRING DIAG: R26.9 (ICD-10-CM) - Gait difficulty   THERAPY DIAG:  Unsteadiness on feet  Other abnormalities of gait and mobility  Muscle weakness (generalized)  Rationale for Evaluation and Treatment: Rehabilitation  SUBJECTIVE:                                                                                                                                                                                              SUBJECTIVE STATEMENT: Nothing new. Brought in my handouts from pool and from our sessions to go over.     Pt accompanied by: self and caregiver  PERTINENT HISTORY: Chronic osteomyelitis with worsening back pain; Hx of multiple R hip fractures with surgery to revisions, significant leg length difference with LLE longer than RLE, hx of infection post surgery in spine (treated on antibiotics) Per MD Note 01/09/2024:    Patient was seen by Dr. Elspeth Schultze who scheduled her for surgery and on  12/01/2023 she underwent   -ROBOTIC LOW ANTERIOR RECTOSIGMOID RESECTION (LAR) WITH ANASTOMOSIS -COLOVAGINAL FISTULA TAKEDOWN & REPAIR -OMENTOPEXY OF VAGINA -INTRAOPERATIVE ASSESSMENT OF TISSUE VASCULAR PERFUSION USING ICG (indocyanine green ) IMMUNOFLUORESCENCE -TRANSVERSUS ABDOMINIS PLANE (TAP) BLOCK - BILATERAL -FLEXIBLE SIGMOIDOSCOPY    Urology did joint surgery and did    1.  Cystoscopy with bilateral ureteral FireFly injections 2.  Transurethral resection of  possible bladder tumor--large   Pathology came back with:   A. BLADDER TUMOR, TRANSURETHERAL RESECTON:  Florid cystitis cystica and papillary cystitis  Negative for carcinoma   B. RECTOSIGMOID COLON RESECTION:  Diverticula with chronic inflammation, perforation, fistula and reactive  fibrosis  Negative for carcinoma  Five benign lymph nodes (0/5)   C. FINAL DISTAL MARGIN:  Benign colonic tissue         PAIN:  Are you having pain? Yes: NPRS scale: 4/10 Pain location: R hip/back Pain description: throbbing, grinding, sharp Aggravating factors: more activities, [place] Relieving factors: rest  PRECAUTIONS: Fall and Other: Recent colectomy-pt reports no restrictions Pt has history of osteoporosis and compression fractures RED  FLAGS: None   WEIGHT BEARING RESTRICTIONS:  No  FALLS: Has patient fallen in last 6 months? No  LIVING ENVIRONMENT: Lives with: lives with their family Lives in: House/apartment Stairs: long ramp in/out of home Has following equipment at home: Vannie - 2 wheeled and Wheelchair (manual)  PLOF: Needs assistance with gait and Vocation/Vocational requirements: retired runner, broadcasting/film/video, tree surgeon; enjoys spending time with grandchildren in [place] and goes to [employer] weekly  PATIENT GOALS: To get stronger, to get back on a cane.  To use walker to get to the car (long ramp), get RLE into car better  OBJECTIVE:   Been able to do the ramp at home a few times with just husband supervision  TODAY'S TREATMENT: 05/31/2024 Activity Comments  FTSTS  14.63 sec BUE support   Gait velocity 14.62 sec 4WW (2.18 ft/sec) Dual task 10 M:17.5 sec with 4WW (1.87 ft/sec)      Gait distance with 4WW 315 ft mod I Improved from 215 ft, stops due to fatigue  Reviewed full HEP -used 1# weight versus band for scap retraction -took away [facility] press -took away band for sit to stand and cues to practice as she has practiced in the pool Verbal and return demo standing ex          Access Code: [email or web address] URL: [email or web address] Date: 03/27/2024 Prepared by: [facility] - Outpatient  Rehab - [facility]  Program Notes Seated leg slides out and in;  have a pillow case under your right foot-slide your foot out and in, 3 x 10 reps.  You can have a 3-5# ankle weight on your ankle  Exercises - Seated Hip Adduction Isometrics with Ball  - 1 x daily - 7 x weekly - 3 sets - 10 reps - Seated Hip Abduction with Resistance  - 1 x daily - 7 x weekly - 3 sets - 10 reps - Sit to Stand with Resistance Around Legs  - 1 x daily - 7 x weekly - 3 sets - 5 reps - Seated Shoulder Row with Anchored Resistance  - 1 x daily - 5 x weekly - 2 sets - 10 reps - Seated Anti-Rotation Press With Anchored Resistance  - 1 x  daily - 7 x weekly - 3 sets - 10 reps - Seated Hamstring Curl with Anchored Resistance  - 1 x daily - 4 x weekly - 3 sets - 10 reps - Supine Quad Set  - 1 x daily - 5 x weekly - 3 sets - 10 reps - 3 sec hold - Short Arc Quad with Ankle Weight  - 1 x daily - 4 x weekly - 3 sets - 10 reps  PATIENT EDUCATION: Education details: POC, progress towards goals, plans for discharge; discussed continuing HEP and trying to work with systems analyst to maintain gains made in PT Person educated: Patient Education method: Programmer, Multimedia, Demonstration, and Handouts Education comprehension: verbalized understanding and returned demonstration        --------------------------------------------------- Note: Objective measures were completed at Evaluation unless otherwise noted.  DIAGNOSTIC FINDINGS: MRI 09/2023   IMPRESSION: 1. Postoperative changes of T11-S1 laminectomy and posterior spinal fusion with right posterior spinal instrumentation from L2-L5. On prior CT, there is evidence of fracture through the left posterior fusion mass at L4-5, correlating with edema is seen in the left pedicle of L4 on today's study. 2. Decreased edema within the L4 and L5 vertebral bodies and posterior elements, as well as within the L4-5 and L3-4 discs, likely reactive and related to the above fracture. 3. Loculated simple  fluid within the thecal sac with some marginalized cauda equina nerve roots, most consistent with arachnoiditis. 4. No spinal canal stenosis or neural foraminal narrowing.  COGNITION: Overall cognitive status: History of cognitive impairments - at baseline   SENSATION: Light touch: Impaired  and to light touch-see below Reports some numbness L foot  POSTURE: Leg length diffierence  LOWER EXTREMITY ROM:     Active  Right Eval Left Eval  Hip flexion    Hip extension    Hip abduction    Hip adduction    Hip internal rotation    Hip external rotation    Knee flexion    Knee  extension    Ankle dorsiflexion    Ankle plantarflexion    Ankle inversion    Ankle eversion     (Blank rows = not tested)  LOWER EXTREMITY MMT:    MMT Right Eval Left Eval  Hip flexion 3- 3+  Hip extension    Hip abduction 4 4  Hip adduction 4 4  Hip internal rotation    Hip external rotation    Knee flexion 4 4  Knee extension 3+ 4  Ankle dorsiflexion 3+ 3+  Ankle plantarflexion    Ankle inversion    Ankle eversion    (Blank rows = not tested)   TRANSFERS: Sit to stand: SBA  Assistive device utilized: Environmental Consultant - 2 wheeled     Stand to sit: SBA  Assistive device utilized: Environmental Consultant - 2 wheeled      GAIT: Findings: Gait Characteristics: excess lateral weightshift through trunk due to significant leg length difference and step through pattern, Distance walked: 50 ft, Assistive device utilized:Walker - 2 wheeled, Level of assistance: CGA, and Comments: NA  FUNCTIONAL TESTS:  5 times sit to stand: 14.44 sec with BUE support Timed up and go (TUG): 24.53 sec 2 minute walk test: NT 10 meter walk test: 21.34 sec (1.54 ft/sec) TUG cognitive: (naming colors) 28.62 sec 34M walk back:  42.16 sec with RW Stroop gait:  13.25 sec in 10 ft (naming color words)-0.75 ft/sec   18.75 sec in 10 feet (naming printed colors)-0.53 ft/sec                                                                          PATIENT EDUCATION: Education details: See above. Person educated: Patient Education method: Explanation Education comprehension: verbalized understanding  HOME EXERCISE PROGRAM: Not yet initiated Did perform eccentric hip flexion/extension in sitting  GOALS: Goals reviewed with patient? Yes  SHORT TERM GOALS: Target date: 02/24/2024  Pt will be independent with HEP for improved strength, balance, gait. Baseline: printed out and reviewed 02/23/24 Goal status: MET 02/23/24  2.  Pt will improve TUG score to less than or equal to 18 sec for decreased fall risk. Baseline: 24.53 sec;  14.54 sec with 4WW 02/23/24 Goal status: MET 02/23/24  3.  Pt will improve 3 M Walk backwards to less than or equal to 30 seconds for decreased fall risk. Baseline: 42.16 sec; 9.96 sec 02/23/24 Goal status: MET 02/23/24  4.  Pt will report at least 50% improvement in car transfers.  Baseline:  difficulty with RLE; Patient reports that she has been able to swing her LEs  into the car by herself- reports 50% improvement. 02/23/24  Goal status:  MET 02/23/24  LONG TERM GOALS: Target date: 06/07/2024    Pt will be independent with HEP for improved strength, balance, gait. Baseline:  Goal status:MET 05/31/2024  2.  Pt will improve gait velocity to at least 1.8 ft/sec for improved gait efficiency and safety. Baseline: 1.54 ft/sec; 1.8 ft/sec w/ 5TT Goal status: MET  3.  Pt will improve gait velocity with dual task to at least 1 ft/sec for improved gait and decreased fall risk. Baseline: 0.53-0.75 ft/sec with Stroop over 10 ft; 1.87 ft/sec 05/31/2024 Goal status: MET, 05/31/2024  4.  Pt will improve 5x sit<>stand to less than or equal to 11.5 sec with light UE support to demonstrate improved functional strength and transfer efficiency. Baseline: 14.44 sec; 13.9 sec; 14.63 sec UE support; able to perform without UE support from mat 05/31/2024 Goal status: NOT MET 05/31/2024  5.  Pt will verbalize plans for continued community fitness upon d/c from PT to maximize gains made in PT. Baseline: plans to look into trainer Goal status: MET, 05/31/2024  6.  Pt will ambulate at least 200 ft in 2 minute walk, for improved gait efficiency and endurance.  Baseline: 100 ft in 1:30; 215 ft w/ 4WW x 2 min  Goal status:  MET   7.  Demo/report ability to negotiate ramp at her home w/ RW and load/unload AD into care to enable ability to enter/leave home  Baseline: unable  Goal status: PARTIALLY MET 05/31/2024  8.  Demo ability to ambulate up to 300 m (984 ft) for community-level ambulation w/ AD  Baseline: 215  ft w/ 4WW>315 ft in 4WW  Goal status: NOT MET  ASSESSMENT:  CLINICAL IMPRESSION: Pt presents today with no new complaints. Skilled PT session focused on assessing LTGs and reviewing HEP. Pt has met 5 of 8 LTGS.  She has met LTG 1 for HEP, LTG 2 and 3 for gait velocity, LTG 5 for community fitness, and LTG 6 for gait.  LTG 4 not met for FTSTS test, but she is able to perform some sit to stand without UE support (as she has practiced in pool).  LTG 7 partially met, as she is ambulating ramp at home, just hasn't practiced putting walker in car.  LTG 8 not met, as pt has improved gait distance prior to rest to 315 ft, just not to goal level.  She has comprehensive HEP from clinic and from aquatic therapy sessions and has been instructed in options for community fitness with personal trainer.  She is appropriate for discharge this visit  and pt is in agreement.   OBJECTIVE IMPAIRMENTS: Abnormal gait, decreased balance, decreased mobility, difficulty walking, decreased strength, impaired sensation, postural dysfunction, and pain.   ACTIVITY LIMITATIONS: sitting, standing, transfers, locomotion level, and caring for others  PARTICIPATION LIMITATIONS: driving, shopping, community activity, and travel  PERSONAL FACTORS: 3+ comorbidities: see PMH above are also affecting patient's functional outcome.   REHAB POTENTIAL: Good  CLINICAL DECISION MAKING: Evolving/moderate complexity  EVALUATION COMPLEXITY: Moderate  PLAN:  PT FREQUENCY: 2x/week  PT DURATION: 6 weeks   PLANNED INTERVENTIONS: [id number]- PT Re-evaluation, 97750- Physical Performance Testing, 97110-Therapeutic exercises, 97530- Therapeutic activity, [id number]- Neuromuscular re-education, 97535- Self Care, [id number]- Manual therapy, [id number]- Gait training, [id number]- Canalith repositioning, [id number]- Aquatic Therapy, Patient/Family education, and Balance training  PLAN FOR NEXT SESSION: .  Discharge this visit.  Greig Anon, PT 05/31/24 11:47 AM Phone:  [phone] Fax: [phone]  [facility] [address] [place], [address] [place], [place] [address] Phone # [phone] Fax # [phone]

## 2024-06-07 ENCOUNTER — Ambulatory Visit

## 2024-06-19 ENCOUNTER — Ambulatory Visit
Admission: RE | Admit: 2024-06-19 | Discharge: 2024-06-19 | Disposition: A | Source: Ambulatory Visit | Attending: Family Medicine

## 2024-06-19 DIAGNOSIS — Z1231 Encounter for screening mammogram for malignant neoplasm of breast: Secondary | ICD-10-CM

## 2024-06-20 ENCOUNTER — Telehealth: Payer: Self-pay | Admitting: Neurology

## 2024-06-20 ENCOUNTER — Other Ambulatory Visit: Payer: Self-pay | Admitting: Physical Medicine & Rehabilitation

## 2024-06-20 NOTE — Telephone Encounter (Signed)
 Patient asking for appointment to make sure does not have Alzheimer's. Start to say a sentence and forget the rest of the sentence and thoughts are not complete in my head. Patient stated, bed ridden and in a wheelchair. Would like to see the neurologist a soon as possible. Please call back

## 2024-06-21 NOTE — Telephone Encounter (Signed)
 Called Pt ,Appt Scheduled

## 2024-06-22 ENCOUNTER — Ambulatory Visit: Admitting: Emergency Medicine

## 2024-06-22 ENCOUNTER — Other Ambulatory Visit: Payer: Self-pay | Admitting: Physical Medicine & Rehabilitation

## 2024-06-22 NOTE — Telephone Encounter (Signed)
 Patient is calling requesting a refill for her Suzetrigine  50mg 

## 2024-06-28 ENCOUNTER — Telehealth: Payer: Self-pay | Admitting: *Deleted

## 2024-06-28 NOTE — Telephone Encounter (Signed)
Prior auth submitted to [facility] via CoverMyMeds. ?

## 2024-07-03 NOTE — Telephone Encounter (Signed)
 Outcome Approved on December 11 by [employer] [facility] [facility] 2017 Approved. Effective Date: 06/28/2024 Authorization Expiration Date: 06/28/2025

## 2024-07-08 NOTE — Progress Notes (Unsigned)
 "  Subjective:  Chief complaint: follow-up for hardware associated diskitis, vertebral osteomyelitis    Patient ID: [name], female    DOB: Jul 13, 1956, 68 y.o.   MRN: [id number]  HPI  68  y.o. female with hx of scoliosis surgery with extensive lower thoracic and lumbar fusion roughly 77yrs ago, she suffered ground level fall in late November, where she fractures through all three columns of L3, thus went to OR on 12/9 for stabilization, new  posterolateral instrumentation fusion on June 26, 2022 complicated by Wound dehiscence with exposed hardware and unstable lumbar spine fracture status post revision of lumbar wound with removal of L1-3 4 and 5 lumbar screws and rod and removal of right L1 screw and revision of L1-L5 posterior instrumented fusion on August 09, 2022 and now concern for postoperative wound infection and ventriculitis. She had complicated protracted course in the icu, but eventually was discharged to CIR on vancomycin  and cefepime  through 3/14 for ventriculitis and hw complicating wound infection. Now changed to doxcycyline plus levofloxacin  as of 3/15 while she continued to need medihoney with moistened quaze with sliver hydrofiber to the caudal portion of wound.   Mri of brain on 09/29/22- resolution of ventriculitis.    Interim history:  Back pain did not appear to be worse but wound did persist and apparently tunneled cranially.  Her white blood cell count with [facility] was in the 16,000 range an abrupt change from her last labs as an inpatient when it was in the 7000 range.  He did have a mechanical fall and fell on her left knee where she has a prosthetic joint.  When I last saw her we rechecked her inflammatory markers and sed rate had titrated down though CRP was going up to 15.6  WBC recheck with our lab was 12,000.  In the interim she has been seen by Dr. Rockney with neurosurgery and CT of the thoracic and lumbar spine were  performed.  These have shown: A new T10 compression fracture with 12% loss of height with unchanged change adjacent chronic T10-T11 T12-L1 posterior element ankylosis with chronic nonfusion at T11-T12, L3 superior endplate nonacute fracture and subtle horizontal fracture of the chronic posterior element of the bone, difficult exclude nondisplaced S3-4 sacral fracture with mild presacral stranding.   Checked labs last time we saw her white blood cell count remained elevated 12,000.  Sed rate and CRP S[place] sed rate actually normalized and CRP had gone up.  Changed her evofloxacin and doxycycline  to cefdinir  and doxycycline .  She is continuing to take these.  She did develop a painful erythematous rash on her hands due to doxycycline  use in the context of sun exposure at the beach.  This is subsequently resolved she has a new somewhat purpuric rash on her right arm that came up in the last few days  She returns to clinic today for follow-up  Discussed the use of AI scribe software for clinical note transcription with the patient, who gave verbal consent to proceed.  At her last visit with me on July 14, 2023 the patient had developed worsening pain in the thoracic spine and hip.  She was also with reduced strength and mobility with having to to now use a walker compared to a cane.  The patient had an MRI of the thoracic and lumbar spine pending after an initial scan done with [facility] had suggested potential infection in the spine.  She also has been found to have a 8 mm  solid mass in the right lung on CT scan.  The MRI was ultimately read and showed  IMPRESSION: 1. Fluid signal within the L3-L4 and L4-L5 discs is suspicious for discitis-osteomyelitis. Progressed bone marrow edema within the L4 vertebral body and bilateral pedicles. There is also increased bone marrow edema at the superior endplate of L5 and right L5 pedicle. 2. Possible subacute superior endplate  fractures of the L4 and L5 vertebral bodies without associated height loss. 3. Minimal residual marrow edema associated with L3 superior endplate fracture. 4. 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  The case was discussed with Dr. Carollee and we both agreed that it would be prudent to have the patient's stop antibiotics and pursue IR guided biopsy for culture after she had been off antibiotics for several weeks.  I called the patient and her daughter on the 26th in the evening and asked that she stop antibiotics so that we could get a culture after she had been off antibiotics for several weeks.  She did not stop antibiotics that night.  In the interim she did have worsening of her back pain she underwent IR guided biopsy with culture yesterday with Gram stain having been negative no organisms isolated yet to date.  With regards to the her lung nodule we have done lab work here including a cryptococcal antigen in the serum which is negative urine histoplasma antigen and Blastomyces antigen.  She has been seen by Dr. Shelah who noted that there was a pneumonia in the area where the lung nodule was seen and he wondered if this finding read as a nodule that might instead represent scarring.  He wanted to pursue further imaging with a PET scan before considering bronchoscopic be with BAL and biopsies.  At her visit with me on December 26th, 2024 patient had worsening pain in an thoracic and lumbar spine after initial scan with [facility] had suggested potential infection in the spine.  She was then found to have an 8 mm solid mass in the right lung on CT scan MRI was ultimately read and showed fluid signal within the L3-L4 and L4-L5 disc suspicious for discitis osteomyelitis with progressed bone marrow edema in the L4 vertebral body and bilateral pedicles also with increased bone marrow edema in the superior endplate of L5 and right L5 pedicle with possible versus  subacute.  Endplate fractures in L4 and L5 with minimal residual bone marrow associated L3 superior endplate fracture with chronic findings of arachnoiditis in the lower lumbar spine.  I called the patient and her daughter on the 83 and had them stop antibiotics with plans for IR guided biopsy for culture.  This was accomplished after they had held antibiotics for several weeks.  Cultures were unrevealing.  We subsequently started her on yet another empiric regimen of oral doxycycline  and cefadroxil  until we could get a line placed at that point in time we placed her on IV daptomycin and cefepime .  In the interim she had complained of vaginal discharge though now I have found out that she has a rectovaginal fistula which could certainly explain that problem.  She then began to complain of malaise with labile blood pressures and lightheadedness nauseousness with tachycardia.    The patient thought there is due to side effects of IV antibiotics and we instructed  her hold IV cefepime  and continue myosin but she actually did the reverse and stop the daptomycin and continued cefepime .  Recently yesterday when she was seen  with cardiology she was hypotensive seated in the chair in the 60s and apparently they had recommended her being seen in the emergency room and potentially hospitalized.  She was given fluid bolus there with improvement in her blood pressure and her losartan  is on hold  Her back pain does not seem to be worse she has no respiratory complaints she is not complaining now of much in the way of nausea or abdominal pain.  She is does continue to have discharge from her rectovaginal fistula and also gas that will emanate from this.  I am quite worried that I have other intra-abdominal pathology such as intra-abdominal abscess or fistula to another organ that is causing all of her symptoms of malaise palpitations nausea.  Review of her labs from home health showed slight leukocytosis  of white count to 14,000 with slight eosinophilia though not terribly much above the normal range her inflammatory markers have come down in the last 2 weeks from 72-23 in the case of her sed rate and 30 116 and 51 in terms of her CRP.  She had been seen by urogynecology who had found frank stool in the vagina from the fistula.  They recommended her being seen by general surgery and wanted a CT scan of the abdomen pelvis performed I think getting a CT scan abdomen pelvis is critical and I would feel better if we could have her directly admitted to the hospital to speed up the process of getting a CT of the abdomen pelvis because I am worried that she may have more pathology than the rectovaginal fistula such as an intra-abdominal abscess driving symptoms.  However Dickey is emphatic about going back into the hospital if at all possible.    She had her CT abdomen pelvis performed which showed the following:  IMPRESSION: Image degradation in lower pelvis due to bilateral hip prostheses and rectal tube.   Findings highly suspicious for gas-containing rectovaginal fistula,  With linear gas collection between the anterior wall of the sigmoid colon and bladder to the left of midline which descends to the region of the vaginal cuff (see image 83/12), highly suspicious for rectovaginal fistula.   Diffuse colonic diverticulosis, with muscular hypertrophy and sigmoid colon. No radiographic evidence of acute diverticulitis.   Multiple ill-defined ground-glass and subsolid nodular opacities in the visualized portions of the right middle and lower lobes. This is almost certainly inflammatory or infectious in etiology given that it is a new compared to recent PET-CT 1 month ago. Recommend clinical correlation, and continued follow-up by chest CT.    I saw the report of the CT scan with regards to the amatory groundglass opacity had some anxiety about whether she could be having some daptomycin  induced eosinophilic pneumonia.  The patient however has no respiratory symptoms other than some chronic dyspnea that preceded her being on antibiotic she has no cough.  Her CBC shows no peripheral eosinophilia and I do not think she has daptomycin induced developed pneumonia.  She still has GI upset but is been able to take her daptomycin and cefepime  with planned stop date of 18 March.  Her inflammatory markers have been a bit of a mixed picture with her sed rate having gone up to 59 from a value of 23 but that was down from 72 her CRP has come down consistently now at 14 from 51 and 116 prior to that her back pain seems relatively stable though it is worse than it was when she first  was dealing with her ventriculitis.  She does have a compression fracture as well.  Surgery is being contemplated by her urogynecologist potentially with general surgery involvement as well.  In talking the patient and her daughter it seems as if the surgeons want her back infection declared cured prior to operating.  I do not think that she should be necessary because her spine if she still harbors infection is of much lower risk to her as far as uncontrolled infection than her rectovaginal fistula.   I have a much greater anxiety her nto have source control in her abdomen and pelvis.     Her pain had been significantly worse at last visit we ordered MRI of the spine:  The scan was read as follows    IMPRESSION: 1. Postoperative changes of T11-S1 laminectomy and posterior spinal fusion with right posterior spinal instrumentation from L2-L5. On prior CT, there is evidence of fracture through the left posterior fusion mass at L4-5, correlating with edema is seen in the left pedicle of L4 on today's study. 2. Decreased edema within the L4 and L5 vertebral bodies and posterior elements, as well as within the L4-5 and L3-4 discs, likely reactive and related to the above fracture. 3. Loculated simple fluid  within the thecal sac with some marginalized cauda equina nerve roots, most consistent with arachnoiditis. 4. No spinal canal stenosis or neural foraminal narrowing.   Patient was seen by Dr. Elspeth Schultze who scheduled her for surgery and on  12/01/2023 she underwent  -ROBOTIC LOW ANTERIOR RECTOSIGMOID RESECTION (LAR) WITH ANASTOMOSIS -COLOVAGINAL FISTULA TAKEDOWN & REPAIR -OMENTOPEXY OF VAGINA -INTRAOPERATIVE ASSESSMENT OF TISSUE VASCULAR PERFUSION USING ICG (indocyanine green ) IMMUNOFLUORESCENCE -TRANSVERSUS ABDOMINIS PLANE (TAP) BLOCK - BILATERAL -FLEXIBLE SIGMOIDOSCOPY   Urology did joint surgery and did   1.  Cystoscopy with bilateral ureteral FireFly injections 2.  Transurethral resection of  possible bladder tumor--large  Pathology came back with:  A. BLADDER TUMOR, TRANSURETHERAL RESECTON:  Florid cystitis cystica and papillary cystitis  Negative for carcinoma   B. RECTOSIGMOID COLON RESECTION:  Diverticula with chronic inflammation, perforation, fistula and reactive  fibrosis  Negative for carcinoma  Five benign lymph nodes (0/5)   C. FINAL DISTAL MARGIN:  Benign colonic tissue   When I last saw her to go on a beach trip and was concerned about doxycycline  causing severe sun exposed rash.  We had her stop the doxycycline  when she was on the beach trip asked her to pay attention to her back pain and it did not worsen while the beach trip but she resumed doxycycline  along with cefadroxil  when she returned.   Discussed the use of AI scribe software for clinical note transcription with the patient, who gave verbal consent to proceed.  History of Present Illness   Catharina Pica is a 68 year old female with culture negative hardware associated vertebral osteomyelitis, diskitis, ventriculitis, who presents for follow-up of her condition.  He has had improved back pain. She previously was on doxycyline and Augmentin . We trialed her off of the doxycycline   since nasal MRSA PCR negative and again we have no culture data to guid therapy  and exchanged the augmentin  for cefadroxil     Cefadroxil  is easier on her stomach compared to doxycycline .   Back pain has not worsened. She does have vertebral osteoporotic fractures and apparently hardware that is threatening to push through her skin.  Her inflammatory markers were rechecked on November 29th and were normal.  She has a history  of a colovaginal fistula, which has been repaired.  She also has a pulmonary nodule that is being followed.                        Past Medical History:  Diagnosis Date   Anemia    Anxiety    Arthritis    Bipolar disorder (HCC)    Cardiomyopathy (HCC)    Complication of anesthesia    Awake but could not move   Depression    Diskitis 10/04/2023   GERD (gastroesophageal reflux disease)    Heart failure (HCC)    Acute   Heart murmur    related to VSD   High cholesterol    History of blood transfusion    related to OR (08/19/2016)   History of hiatal hernia    Hypertension    Hyperthyroidism    Hypotension 09/06/2023   Mild cognitive impairment 09/06/2018   Paroxysmal ventricular tachycardia (HCC)    Pneumonia    Rectovaginal fistula 09/06/2023   Tachycardia, unspecified    Type II diabetes mellitus (HCC)    UTI (urinary tract infection)    being treated with Keflex    Ventricular septal defect    Ventriculitis of brain due to bacteria 11/02/2022   Vertebral fracture, osteoporotic (HCC) 01/06/2023    Past Surgical History:  Procedure Laterality Date   ABDOMINAL HYSTERECTOMY     BACK SURGERY     CARDIAC CATHETERIZATION N/A 06/23/2015   Procedure: Left Heart Cath and Coronary Angiography;  Surgeon: Ezra GORMAN Shuck, MD;  Location: [facility] INVASIVE CV LAB;  Service: Cardiovascular;  Laterality: N/A;   CARDIAC CATHETERIZATION  1960   VSD was so small; didn't need repaired   COLECTOMY, SIGMOID, ROBOT-ASSISTED N/A 11/30/2023   Procedure:  COLECTOMY, SIGMOID, ROBOT-ASSISTED ROBOTIC LOW ANTERIOR RECTOSIGMOID RESECTION (LAR) WITH ANASTOMOSIS COLOVAGINAL FISTULA TAKEDOWN & REPAIR OMENTOPEXY OF VAGINA INTRAOPERATIVE ASSESSMENT OF TISSUE VASCULAR PERFUSION USING ICG (indocyanine green ) IMMUNOFLUORESCENCE TRANSVERSUS ABDOMINIS PLANE (TAP) BLOCK - BILATERAL;  Surgeon: Sheldon Standing, MD;  Location: [facility];  Service: General;  Late   CYSTOSCOPY WITH INDOCYANINE GREEN  IMAGING (ICG) N/A 11/30/2023   Procedure: Cystoscopy with bilateral ureteral FireFly injections Transurethral resection of bladder tumor--large;  Surgeon: Carolee Sherwood JONETTA DOUGLAS, MD;  Location: [facility];  Service: Urology;  Laterality: N/A;   EXAM UNDER ANESTHESIA WITH MANIPULATION OF HIP Right 06/02/2014   dr sheril   FLEXIBLE SIGMOIDOSCOPY N/A 11/30/2023   Procedure: KINGSTON SIDE;  Surgeon: Sheldon Standing, MD;  Location: [facility];  Service: General;  Laterality: N/A;   FRACTURE SURGERY     HERNIA REPAIR     HIP CLOSED REDUCTION Right 06/02/2014   Procedure: CLOSED MANIPULATION HIP;  Surgeon: Maude KANDICE Sheril, MD;  Location: [facility] OR;  Service: Orthopedics;  Laterality: Right;   IR FLUORO GUIDED NEEDLE PLC ASPIRATION/INJECTION LOC  08/09/2023   JOINT REPLACEMENT     JOINT REPLACEMENT     POSTERIOR LUMBAR FUSION 4 LEVEL N/A 06/26/2022   Procedure: Lumbar One To Lumbar Five Posterior Instrumented Fusion;  Surgeon: Cheryle Debby LABOR, MD;  Location: [facility] OR;  Service: Neurosurgery;  Laterality: N/A;   REFRACTIVE SURGERY Bilateral    SHOULDER ARTHROSCOPY Right    SHOULDER OPEN ROTATOR CUFF REPAIR Right    SPINAL FUSION  1996   t10 down to my coccyx   SPINE HARDWARE REMOVAL     TOTAL ABDOMINAL HYSTERECTOMY     TOTAL HIP ARTHROPLASTY Right 05/10/2014   [place]  by dr lonni ditty   TOTAL KNEE ARTHROPLASTY Left    TOTAL SHOULDER ARTHROPLASTY Left 08/19/2016   Procedure: TOTAL SHOULDER ARTHROPLASTY;  Surgeon: Eva Herring, MD;  Location: [facility] OR;  Service:  Orthopedics;  Laterality: Left;  Left total shoulder replacement    Family History  Problem Relation Age of Onset   Other Mother        alive   Stroke Father 47       deceased   Dementia Father    Chorea Maternal Grandfather    Dementia Maternal Aunt    Dementia Maternal Aunt    Heart attack Other        multiple uncles have died with myocardial infarction   Bladder Cancer Neg Hx    Uterine cancer Neg Hx       Social History   Socioeconomic History   Marital status: Married    Spouse name: Not on file   Number of children: Not on file   Years of education: Not on file   Highest education level: Master's degree (e.g., MA, MS, MEng, MEd, MSW, MBA)  Occupational History   Occupation: Magazine Features Editor: SELF-EMPLOYED    Comment: former  Tobacco Use   Smoking status: Every Day    Types: Cigarettes    Passive exposure: Never   Smokeless tobacco: Never   Tobacco comments:    03/30/21 smokes 2 cigs daily.      08/06/22 GLENWOOD Raisin stated that the patient is not smoking while at [facility] for rehab. KM    11-23-23 has been quit for 3 weeks  Vaping Use   Vaping status: Never Used  Substance and Sexual Activity   Alcohol use: No   Drug use: No   Sexual activity: Not Currently    Partners: Male    Birth control/protection: Surgical    Comment: Hysterectomy  Other Topics Concern   Not on file  Social History Narrative   Right handed   Caffeine  2-3 cups daily    Lives at home with husband    Social Drivers of Health   Tobacco Use: High Risk (05/31/2024)   Patient History    Smoking Tobacco Use: Every Day    Smokeless Tobacco Use: Never    Passive Exposure: Never  Financial Resource Strain: Low Risk  (07/08/2023)   Received from [facility]   Overall Financial Resource Strain (CARDIA)    Difficulty of Paying Living Expenses: Not hard at all  Food Insecurity: No Food Insecurity (11/30/2023)   Hunger Vital Sign    Worried About Running Out of Food  in the Last Year: Never true    Ran Out of Food in the Last Year: Never true  Transportation Needs: No Transportation Needs (11/30/2023)   PRAPARE - Administrator, Civil Service (Medical): No    Lack of Transportation (Non-Medical): No  Physical Activity: Not on file  Stress: Not on file  Social Connections: Socially Integrated (11/30/2023)   Social Connection and Isolation Panel    Frequency of Communication with Friends and Family: Twice a week    Frequency of Social Gatherings with Friends and Family: Twice a week    Attends Religious Services: 1 to 4 times per year    Active Member of Clubs or Organizations: No    Attends Banker Meetings: 1 to 4 times per year    Marital Status: Married  Depression (PHQ2-9): Low Risk (02/13/2024)   Depression (PHQ2-9)  PHQ-2 Score: 0  Alcohol Screen: Not on file  Housing: Low Risk (11/30/2023)   Housing Stability Vital Sign    Unable to Pay for Housing in the Last Year: No    Number of Times Moved in the Last Year: 0    Homeless in the Last Year: No  Utilities: Not At Risk (11/30/2023)   AHC Utilities    Threatened with loss of utilities: No  Health Literacy: Not on file    Allergies  Allergen Reactions   Gallium (Radioactive Isotope) Swelling    Pt states she can not have radioactive injections.    Paxlovid [Nirmatrelvir-Ritonavir]      Current Outpatient Medications:    acetaminophen  (TYLENOL ) 325 MG tablet, Take 1-2 tablets (325-650 mg total) by mouth every 6 (six) hours as needed for mild pain or headache., Disp: , Rfl:    ascorbic acid  (VITAMIN C ) 500 MG tablet, Take 500 mg by mouth daily., Disp: , Rfl:    ASPIRIN  81 PO, Take 81 mg by mouth daily., Disp: , Rfl:    buPROPion  (WELLBUTRIN  XL) 150 MG 24 hr tablet, Take 1 tablet (150 mg total) by mouth daily., Disp: 30 tablet, Rfl: 0   busPIRone  (BUSPAR ) 7.5 MG tablet, Take 7.5 mg by mouth 2 (two) times daily., Disp: , Rfl:    Calcium  Carb-Cholecalciferol   (CALCIUM  600+D3) 600-20 MG-MCG TABS, Take 1 tablet by mouth in the morning and at bedtime., Disp: , Rfl:    carvedilol  (COREG ) 25 MG tablet, Take 1 tablet (25 mg total) by mouth 2 (two) times daily with a meal., Disp: 60 tablet, Rfl: 0   cefadroxil  (DURICEF) 500 MG capsule, Take 2 capsules (1,000 mg total) by mouth 2 (two) times daily., Disp: 120 capsule, Rfl: 11   Cholecalciferol  (VITAMIN D3) 50 MCG (2000 UT) capsule, Take 2,000 Units by mouth daily., Disp: , Rfl:    cyclobenzaprine  (FLEXERIL ) 10 MG tablet, Take 10 mg by mouth 3 (three) times daily., Disp: , Rfl:    denosumab (PROLIA) 60 MG/ML SOSY injection, Inject 60 mg into the skin every 6 (six) months., Disp: , Rfl:    diphenoxylate -atropine  (LOMOTIL ) 2.5-0.025 MG tablet, Take 1 tablet by mouth 2 (two) times daily as needed for diarrhea or loose stools., Disp: 30 tablet, Rfl: 0   donepezil  (ARICEPT ) 10 MG tablet, Take 10 mg by mouth every morning., Disp: , Rfl:    empagliflozin  (JARDIANCE ) 10 MG TABS tablet, Take 1 tablet (10 mg total) by mouth daily before breakfast., Disp: 90 tablet, Rfl: 3   estradiol  (ESTRACE ) 0.1 MG/GM vaginal cream, Place 0.5g nightly for two weeks then twice a week after, Disp: 30 g, Rfl: 3   famotidine  (PEPCID ) 20 MG tablet, Take 1 tablet (20 mg total) by mouth 2 (two) times daily. (Patient taking differently: Take 20 mg by mouth every evening.), Disp: 60 tablet, Rfl: 0   fenoprofen (NALFON ) 600 MG TABS tablet, Take 600 mg by mouth in the morning and at bedtime., Disp: , Rfl:    fluvoxaMINE  (LUVOX ) 100 MG tablet, Take 1 tablet (100 mg total) by mouth at bedtime., Disp: 30 tablet, Rfl: 0   gabapentin  (NEURONTIN ) 100 MG capsule, Take 1 capsule (100 mg total) by mouth 3 (three) times daily., Disp: 90 capsule, Rfl: 0   JOURNAVX  50 MG TABS, TAKE 2 TABLETS BY MOUTH FOR FIRST DOSE AT START OF PAIN EPISODE ON EMPTY STOMACH THEN TAKE 1 TABLET BY MOUTH EVERY 12 HOURS UNTIL PAIN RESOLVED, Disp: 30 tablet, Rfl: 1  Lactobacillus  (ACIDOPHILUS) 100 MG CAPS, Take 1 capsule (100 mg total) by mouth 2 (two) times daily. (Patient taking differently: Take 250 mg by mouth daily.), Disp: 60 capsule, Rfl: 0   lamoTRIgine  (LAMICTAL ) 100 MG tablet, Take 200 mg by mouth at bedtime., Disp: , Rfl:    levothyroxine  (SYNTHROID ) 88 MCG tablet, Take 1 tablet (88 mcg total) by mouth daily before breakfast., Disp: 30 tablet, Rfl: 0   losartan  (COZAAR ) 25 MG tablet, Take 0.5 tablets (12.5 mg total) by mouth daily., Disp: , Rfl:    Melatonin 10 MG TABS, Take 10 mg by mouth at bedtime., Disp: , Rfl:    metFORMIN  (GLUCOPHAGE ) 500 MG tablet, Take 1 tablet (500 mg total) by mouth 2 (two) times daily with a meal., Disp: 60 tablet, Rfl: 0   Multiple Vitamin (MULTIVITAMIN) tablet, Take 1 tablet by mouth daily.  , Disp: , Rfl:    Multiple Vitamins-Minerals (ZINC PO), Take 50 mg by mouth daily., Disp: , Rfl:    nicotine  (NICODERM CQ  - DOSED IN MG/24 HOURS) 21 mg/24hr patch, Place 21 mg onto the skin daily., Disp: , Rfl:    nicotine  polacrilex (NICORETTE ) 4 MG gum, Take 4 mg by mouth as needed for smoking cessation., Disp: , Rfl:    ondansetron  (ZOFRAN ) 4 MG tablet, Take 1 tablet (4 mg total) by mouth every 8 (eight) hours as needed for refractory nausea / vomiting., Disp: 20 tablet, Rfl: 0   pantoprazole  (PROTONIX ) 40 MG tablet, Take 40 mg by mouth every morning., Disp: , Rfl:    potassium chloride  (KLOR-CON ) 10 MEQ tablet, Take 20 mEq by mouth 3 (three) times daily., Disp: , Rfl:    rosuvastatin  (CRESTOR ) 20 MG tablet, Take 20 mg by mouth daily., Disp: , Rfl:    sodium chloride  1 g tablet, Take 1 tablet (1 g total) by mouth 2 (two) times daily with a meal., Disp: 60 tablet, Rfl: 0   traZODone  (DESYREL ) 100 MG tablet, Take 1 tablet (100 mg total) by mouth at bedtime., Disp: 30 tablet, Rfl: 0   VRAYLAR  1.5 MG capsule, Take 1.5 mg by mouth at bedtime., Disp: , Rfl:    Review of Systems  Constitutional:  Negative for activity change, appetite change,  chills, diaphoresis, fatigue, fever and unexpected weight change.  HENT:  Negative for congestion, rhinorrhea, sinus pressure, sneezing, sore throat and trouble swallowing.   Eyes:  Negative for photophobia and visual disturbance.  Respiratory:  Negative for cough, chest tightness, shortness of breath, wheezing and stridor.   Cardiovascular:  Negative for chest pain, palpitations and leg swelling.  Gastrointestinal:  Negative for abdominal distention, abdominal pain, anal bleeding, blood in stool, constipation, diarrhea, nausea and vomiting.  Genitourinary:  Negative for difficulty urinating, dysuria, flank pain and hematuria.  Musculoskeletal:  Positive for back pain. Negative for arthralgias, gait problem, joint swelling and myalgias.  Skin:  Negative for color change, pallor, rash and wound.  Neurological:  Negative for dizziness, tremors, weakness and light-headedness.  Hematological:  Negative for adenopathy. Does not bruise/bleed easily.  Psychiatric/Behavioral:  Negative for agitation, behavioral problems, confusion, decreased concentration, dysphoric mood and sleep disturbance.        Objective:   Physical Exam Constitutional:      General: She is not in acute distress.    Appearance: Normal appearance. She is well-developed. She is not ill-appearing or diaphoretic.  HENT:     Head: Normocephalic and atraumatic.     Right Ear: Hearing and external ear normal.  Left Ear: Hearing and external ear normal.     Nose: No nasal deformity or rhinorrhea.  Eyes:     General: No scleral icterus.    Conjunctiva/sclera: Conjunctivae normal.     Right eye: Right conjunctiva is not injected.     Left eye: Left conjunctiva is not injected.     Pupils: Pupils are equal, round, and reactive to light.  Neck:     Vascular: No JVD.  Cardiovascular:     Rate and Rhythm: Normal rate and regular rhythm.     Heart sounds: S1 normal and S2 normal.  Pulmonary:     Effort: Pulmonary effort is  normal. No respiratory distress.     Breath sounds: No wheezing.  Abdominal:     General: There is no distension.     Palpations: Abdomen is soft.  Musculoskeletal:        General: Normal range of motion.     Right shoulder: Normal.     Left shoulder: Normal.     Cervical back: Normal range of motion and neck supple.     Right hip: Normal.     Left hip: Normal.     Right knee: Normal.     Left knee: Normal.  Lymphadenopathy:     Head:     Right side of head: No submandibular, preauricular or posterior auricular adenopathy.     Left side of head: No submandibular, preauricular or posterior auricular adenopathy.     Cervical: No cervical adenopathy.     Right cervical: No superficial or deep cervical adenopathy.    Left cervical: No superficial or deep cervical adenopathy.  Skin:    General: Skin is warm and dry.     Coloration: Skin is not pale.     Findings: No abrasion, bruising, ecchymosis, erythema, lesion or rash.     Nails: There is no clubbing.  Neurological:     General: No focal deficit present.     Mental Status: She is alert and oriented to person, place, and time.     Sensory: No sensory deficit.     Coordination: Coordination normal.     Gait: Gait normal.  Psychiatric:        Attention and Perception: She is attentive.        Mood and Affect: Mood normal.        Speech: Speech normal.        Behavior: Behavior normal. Behavior is cooperative.        Thought Content: Thought content normal.        Judgment: Judgment normal.        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 Switched to cefadroxil  due to side effects from previous antibiotics. Surgical intervention deferred due to infection concerns. - Ordered labs to check inflammatory markers today. - Continue cefadroxil . - Consider trial off antibiotics in March  if labs are normal and pain is well-controlled. followed by another appt 3 months later off of antibiotics - Monitor for increased back pain and report immediately if occurs. - Schedule follow-up in March to reassess condition and labs.   Hardware issues in spine: she told me that Dr. Debby would not operate on this area due to concerns about active infection   This is another reason to see if she can do well off of antibiotics and we can prove as best we can  that we have cured her   Colovaginal fistula: repaired   Pulmonary nodule: followed closely by Pulmonary.       "

## 2024-07-09 ENCOUNTER — Encounter: Payer: Self-pay | Admitting: Infectious Disease

## 2024-07-09 ENCOUNTER — Other Ambulatory Visit: Payer: Self-pay

## 2024-07-09 ENCOUNTER — Ambulatory Visit: Payer: Self-pay | Admitting: Infectious Disease

## 2024-07-09 VITALS — BP 155/98 | HR 77 | Temp 97.6°F

## 2024-07-09 DIAGNOSIS — G8929 Other chronic pain: Secondary | ICD-10-CM

## 2024-07-09 DIAGNOSIS — T847XXD Infection and inflammatory reaction due to other internal orthopedic prosthetic devices, implants and grafts, subsequent encounter: Secondary | ICD-10-CM | POA: Diagnosis not present

## 2024-07-09 DIAGNOSIS — B9689 Other specified bacterial agents as the cause of diseases classified elsewhere: Secondary | ICD-10-CM

## 2024-07-09 DIAGNOSIS — T847XXS Infection and inflammatory reaction due to other internal orthopedic prosthetic devices, implants and grafts, sequela: Secondary | ICD-10-CM

## 2024-07-09 DIAGNOSIS — M4646 Discitis, unspecified, lumbar region: Secondary | ICD-10-CM | POA: Diagnosis not present

## 2024-07-09 DIAGNOSIS — S32009G Unspecified fracture of unspecified lumbar vertebra, subsequent encounter for fracture with delayed healing: Secondary | ICD-10-CM

## 2024-07-09 DIAGNOSIS — M8008XS Age-related osteoporosis with current pathological fracture, vertebra(e), sequela: Secondary | ICD-10-CM

## 2024-07-09 DIAGNOSIS — Z8739 Personal history of other diseases of the musculoskeletal system and connective tissue: Secondary | ICD-10-CM

## 2024-07-09 DIAGNOSIS — M4626 Osteomyelitis of vertebra, lumbar region: Secondary | ICD-10-CM

## 2024-07-09 DIAGNOSIS — T8142XD Infection following a procedure, deep incisional surgical site, subsequent encounter: Secondary | ICD-10-CM

## 2024-07-09 DIAGNOSIS — L988 Other specified disorders of the skin and subcutaneous tissue: Secondary | ICD-10-CM | POA: Diagnosis not present

## 2024-07-10 LAB — CBC WITH DIFFERENTIAL/PLATELET
Absolute Lymphocytes: 1940 {cells}/uL (ref 850–3900)
Absolute Monocytes: 664 {cells}/uL (ref 200–950)
Basophils Absolute: 67 {cells}/uL (ref 0–200)
Basophils Relative: 0.8 %
Eosinophils Absolute: 311 {cells}/uL (ref 15–500)
Eosinophils Relative: 3.7 %
HCT: 42 % (ref 35.9–46.0)
Hemoglobin: 13.6 g/dL (ref 11.7–15.5)
MCH: 30.3 pg (ref 27.0–33.0)
MCHC: 32.4 g/dL (ref 31.6–35.4)
MCV: 93.5 fL (ref 81.4–101.7)
MPV: 8.7 fL (ref 7.5–12.5)
Monocytes Relative: 7.9 %
Neutro Abs: 5418 {cells}/uL (ref 1500–7800)
Neutrophils Relative %: 64.5 %
Platelets: 360 Thousand/uL (ref 140–400)
RBC: 4.49 Million/uL (ref 3.80–5.10)
RDW: 14.9 % (ref 11.0–15.0)
Total Lymphocyte: 23.1 %
WBC: 8.4 Thousand/uL (ref 3.8–10.8)

## 2024-07-10 LAB — BASIC METABOLIC PANEL WITHOUT GFR
BUN: 14 mg/dL (ref 7–25)
CO2: 24 mmol/L (ref 20–32)
Calcium: 9.2 mg/dL (ref 8.6–10.4)
Chloride: 103 mmol/L (ref 98–110)
Creat: 0.69 mg/dL (ref 0.50–1.05)
Glucose, Bld: 124 mg/dL — ABNORMAL HIGH (ref 65–99)
Potassium: 4.7 mmol/L (ref 3.5–5.3)
Sodium: 138 mmol/L (ref 135–146)

## 2024-07-10 LAB — C-REACTIVE PROTEIN: CRP: <3 mg/L

## 2024-07-10 LAB — SEDIMENTATION RATE: Sed Rate: 2 mm/h (ref 0–30)

## 2024-07-23 ENCOUNTER — Telehealth: Payer: Self-pay | Admitting: Neurology

## 2024-07-23 NOTE — Telephone Encounter (Signed)
 I called pt and she is asking for sooner appt due to her recent dx bacterial meningitis, and it worsening her memory.  She is followed by her infectious disease/ pcp.  I told her she is on waitlist and I can keep look out for opening on Dr. Buck schedule.  She appreciated this.

## 2024-07-23 NOTE — Telephone Encounter (Signed)
 This is a former Dr Jenel pt who has been seeing Lauraine since Dr Jenel left.  When pt askd about Sarah's next available appointment and was told July she asked if she might see a MD earlier for her condition. New Pt referrals confirmed it to be ok to schedule pt to see Dr Buck.  Pt is asking if while waiting to be seen a RN can call to discuss her recent battle with bacterial Meningitis, please call(pt's upcoming appointment is on wait list with Dr Buck).

## 2024-07-25 ENCOUNTER — Ambulatory Visit: Admitting: Neurology

## 2024-07-30 NOTE — Telephone Encounter (Signed)
 I tried to call pt and offer her appt tomorrow.  She did not pick up.

## 2024-07-31 ENCOUNTER — Ambulatory Visit
Admission: RE | Admit: 2024-07-31 | Discharge: 2024-07-31 | Disposition: A | Source: Ambulatory Visit | Attending: Emergency Medicine | Admitting: Emergency Medicine

## 2024-07-31 DIAGNOSIS — R911 Solitary pulmonary nodule: Secondary | ICD-10-CM

## 2024-08-16 ENCOUNTER — Encounter: Payer: Self-pay | Admitting: Physical Medicine & Rehabilitation

## 2024-08-16 ENCOUNTER — Encounter: Attending: Physical Medicine & Rehabilitation | Admitting: Physical Medicine & Rehabilitation

## 2024-08-16 VITALS — BP 147/84 | HR 67 | Ht 60.0 in | Wt 157.0 lb

## 2024-08-16 DIAGNOSIS — G8929 Other chronic pain: Secondary | ICD-10-CM | POA: Insufficient documentation

## 2024-08-16 DIAGNOSIS — M545 Low back pain, unspecified: Secondary | ICD-10-CM | POA: Insufficient documentation

## 2024-08-16 DIAGNOSIS — M4626 Osteomyelitis of vertebra, lumbar region: Secondary | ICD-10-CM | POA: Insufficient documentation

## 2024-08-16 DIAGNOSIS — S22070D Wedge compression fracture of T9-T10 vertebra, subsequent encounter for fracture with routine healing: Secondary | ICD-10-CM | POA: Insufficient documentation

## 2024-08-16 DIAGNOSIS — M546 Pain in thoracic spine: Secondary | ICD-10-CM | POA: Diagnosis present

## 2024-08-16 NOTE — Progress Notes (Signed)
 "  Subjective:    [name] ID: [name], female    DOB: [birth year], 69 y.o.   MRN: [id number]  HPI  HPI   Excerpt from discharge summary 10/13/2022 Brief HPI:   [name] is a 69 y.o. female with history of PVT, T2DM, bipolar d/o, mild cognitive impairments, right femur fracture with RLE leg length discrepancy and gait disorder, fall 05/2022 with 3 column unstable fracture through prior scoliosis construct s/p instrumentation with exposed hardware and wound dehiscence. She was admitted on 08/09/22 for revision of lumbar wound with removal of hardware and revision L1-L5 fusion by Dr. Cheryle. Post op course was significant for delirium with fevers, PNA with respiratory failure requiring intubation 2/01. MRI brain showed evidence of ventriculitis and MRI spine showed widely patent fusion with decrease in bone marrow edema L3.    Dr. Fleeta Rothman recommended IV antibiotics X 6 weeks empirically with flagyl  to cover aspiration PNA. She required tracheostomy as well as foley for urinary retention and rectal tube due to frequent loose stools and to prevent wound contamination. WOC was following for wound VAC changes. She was downsized to CFS#4 prior prior to d/c. PT/OT was working with [name] who was limited by weakness, cognitive deficits requiring increased time for processing as well as verbal and tactile cues to follow simple commands as well as max to total assist with ADLs and mobility. CIR was recommended due to functional decline.       Hospital Course: [name] was admitted to rehab 09/03/2022 for inpatient therapies to consist of PT, ST and OT at least three hours five days a week. Past admission physiatrist, therapy team and rehab RN have worked together to provide customized collaborative inpatient rehab. Her blood pressures were monitored on TID basis and have been stable. As mentation and swallow function improved, diet was advanced to regular textures. She was  tolerating PMSV and was decannulated without difficulty. Her diabetes has been monitored with ac/hs CBG checks and was being managed with SSI due to variable intake and was reasonably controlled. As swallow function improved, she was advanced to regular textures, intake has improved with family providing food from home. She did request resumption of metformin  and d/c of CBG checks. She is tolerating metformin  without SE except for am nausea which could be due to transition to oral antibiotics. [name] has been educated on importance of BS management to promote wound healing and recommended to  monitor this post d/c. Her CBG checks d/c as BS controlled and have been monitored weekly with weekly labs.    She was maintained on Vanc/cefepime  thru 03/14 and as follow up MRI brain 03/13 showed  resolution of ventriculitis, she was transitioned to Levaquin  and doxycycline  bid per ID input. She is to continue on oral antibiotics X 4 weeks and follow up at [facility] on 04/16 for final decision on duration of antibiotic treatment. WOC has been following to give input on [name]'s wound care and for monitoring. Wound VAC was discontinued on 02/21 and wet to dry dressing changes with Aquacel Ag was initiated. She did develop some slough on wound bed and metahoney added initially but changed to santyl  which has helped to debride it. [name] did report that she plans  on showering at home and WOC recommended changing back to Aquacel for antibacterial protection.    Ensure and protein supplements added to help promote wound healing as as mentation improved but she had declined these frequently.  Voiding trial was attempted  with increase in tamsulosin  as well as addition of urecholine . She continued to require I/O caths and [name] elected on  foley with plans for voiding trial on outpatient basis.  She has had high levels of anxiety and ego support has been provided by rehab team. Her mentation has improved with cognition trending  back to baseline. She expressed concerns about her psychiatric medication regimen as multiple meds has been d/c while on acute and requested psychiatry input. Her home Wellbutrin  was resumed on 03/21 and Dr. Fredia who evaluated [name] felt that [name] was at her baseline and no additional meds were recommended.    Pain control is improving and she has been educated on importance of weaning narcotics gradually. She continues Butran's patch as well as low dose gabapentin  as sedation was reported with attempts at up titration of latter.   She started making progress and LOS was extended by a week to help achieve higher goals. She requires min assist with ADL and mobility. She will continue to receive follow up HHPT, HHOT, HHST, HHaide and HHRN by [facility] after discharge.      Rehab course: During [name]'s stay in rehab weekly team conferences were held to monitor [name]'s progress, set goals and discuss barriers to discharge. At admission, [name] required +2 total assist with ADL tasks and mod to total assist with pregait activity. She exhibited deficits in recall, attention, insight and required min to mod assist for processing and attention to tasks. She  has had improvement in activity tolerance, balance, postural control as well as ability to compensate for deficits. She has had improvement in functional use RUE and RLE as well as improvement in awareness. She requires min assist with ADL tasks.  She requires CGA for transfers and to ambulate 42 feet with RW and cues for sequencing.  She is able to complete functional cognitive tasks with min assist and requires min cues to self correct errors and for re-direction to tasks. Family education has been completed and family has hired caregivers to assist with care.     Marissa history 12/10/2022 [name] is here for follow-up after CIR and above treatment.  She was also seen by Fidela Ned on 11/09/2022.  She reports she is continue to  work with physical therapy however she was discharged by OT.  She says after several weeks she may be able to transition to outpatient therapy.  Foley has been removed and she is voiding on her own, this does require to get up more frequently.  Her back pain is currently controlled with gabapentin , buprenorphine  that was increased to 10 mcg/h patch and as needed oxycodone .  She has been seen by Dr. Fleeta Rothman infectious disease and is continued on antibiotics of doxycycline  and Levaquin .  She continues to follow-up with Dr. Rockney.  She had a CT of her T-spine and L-spine recently that showed a new compression fracture.   Marissa history 03/15/2023 [name] is here for follow-up regarding conditions listed above.  She reports she is doing well overall.  She continues to work with physical therapy and Occupational Therapy and feels like she is making good progress.  She says she is discharged from her wound care clinic because her lower back wound is now closed.  She is no longer on tramadol , buprenorphine  or oxycodone  and is now on Flexeril  for her back pain.  She feels like therapy is causing some soreness in her back.  Pain does not shoot down her legs.  Reports she has a TENS and this provides minimal benefit.   [name] is here for follow-up.  She reports she was doing very well with outpatient PT and OT and making good progress.  She later developed worsening thoracic pain and was found to have a T10 compression fracture.  She reports Dr. Rockney has retired but she is planning to follow-up with Dr. Carollee. She has been treated with Norco 5.  She reports she would like to restart PT after she has her potential kyphoplasty.  Marissa history 10/20/23 [name] reports that she continues to have pain and issues with her lower back.  She continues to follow-up with ID for vertebral osteomyelitis.  She remains on IV antibiotics but it sounds like infectious disease planning to  switch her to oral medications.  She also reports rectovaginal fistula.  [name] been seen by Dr. Almarie Molt with neurosurgery at [facility].  She reports surgeons do not want to do any additional operations due to spinal infection.  She continues to have severe lower back pain.  Pain overall has been controlled with Norco 5 mg and buprenorphine  10 mcg/h.  This she also takes gabapentin  100 mg 3 times daily.  Marissa history 02/13/24 [name] has been going to neuro PT, reports this has been beneficial to her mobility and function.  Continues on antibiotics for osteomyelitis/discitis, doxycycline  followed by ID.  Reports she has been careful with photosensitivity.  Reports she spoke with a different provider where options of  gummy type (CBD vs THC?) products versus other pain medicines were discussed.  She tried some Gummies and found them to be helpful and has been using them intermittently.    Marissa History 05/17/24 Reports continued benefit from Journavx  for acute pain exacerbations. Not taking it daily,  but for occasional severe pain/acute pain exacerbations. Reports it allows for improved function. Reviewed again its for short-term use, not chronic daily use.   Hardware is palpable and painful on the back, especially with sitting or lying down.  Cannot have revision surgery due to infection.   Physical Therapy Currently doing PT at [facility], including aquatics at the [facility] facility. Finds it helpful for pain and strength. Nearing the end of the current course of PT. Interested in transitioning to a systems analyst at the [facility] facility ([facility]) for one-on-one sessions to maintain progress. A letter of medical necessity for a heated pool was provided to potentially obtain a discount on a gym membership.  Insomnia Reports chronic insomnia, averaging 4 hours of sleep per night. States lack of sleep exacerbates depression and pain. - Current sleep aids: Trazodone  (not very  effective), Vylar. Also takes Flexeril  TID for muscle pain. - Sleep hygiene review: - Avoids phone use in bed. - Watches TV at night. - Consumes diet Coke, sometimes in the afternoon. - Advised to avoid caffeine  after noon and to consider caffeine -free options. - Advised to use blue light filtering glasses or a screen filter if watching TV at night, avoid bright light if possible - Discussed the importance of a cool sleeping environment.  Marissa History 08/16/24 The [name] presents for follow-up regarding chronic back pain and functional limitations.  She has been intermittently participating in physical therapy and finds aquatic therapy beneficial for her lower back and hip pain. She completed a course of aquatic therapy last year.  Pain and function has worsened since therapy has stopped. She would like a new referral to continue treatment.  Past Medical History - Scoliosis. - History  of osteomyelitis, currently on antibiotics.  Medications - Journavx  helps for acute pain flairs  Functional Status & Goals - Uses a walker for ambulation at home. - Requires a wheelchair for longer distances, such as crossing a parking lot. - Primary goal is to improve strength and stamina to prepare for a family trip to [place] in July.  Pain Inventory Average Pain 5 Pain Right Now 4 My pain is constant, dull, and aching  In the last 24 hours, has pain interfered with the following? General activity 10 Relation with others 7 Enjoyment of life 7 What TIME of day is your pain at its worst? daytime Sleep (in general) Poor  Pain is worse with: walking, bending, sitting, and standing Pain improves with: medication Relief from Meds: 7  Family History  Problem Relation Age of Onset   Other Mother        alive   Stroke Father 31       deceased   Dementia Father    Chorea Maternal Grandfather    Dementia Maternal Aunt    Dementia Maternal Aunt    Heart attack Other        multiple  uncles have died with myocardial infarction   Bladder Cancer Neg Hx    Uterine cancer Neg Hx    Social History   Socioeconomic History   Marital status: Married    Spouse name: Not on file   Number of children: Not on file   Years of education: Not on file   Highest education level: Master's degree (e.g., MA, MS, MEng, MEd, MSW, MBA)  Occupational History   Occupation: Magazine Features Editor: SELF-EMPLOYED    Comment: former  Tobacco Use   Smoking status: Every Day    Types: Cigarettes    Passive exposure: Never   Smokeless tobacco: Never   Tobacco comments:    03/30/21 smokes 2 cigs daily.      08/06/22 GLENWOOD Raisin stated that the [name] is not smoking while at [facility] for rehab. KM    11-23-23 has been quit for 3 weeks  Vaping Use   Vaping status: Never Used  Substance and Sexual Activity   Alcohol use: No   Drug use: No   Sexual activity: Not Currently    Partners: Male    Birth control/protection: Surgical    Comment: Hysterectomy  Other Topics Concern   Not on file  Social History Narrative   Right handed   Caffeine  2-3 cups daily    Lives at home with husband    Social Drivers of Health   Tobacco Use: High Risk (07/09/2024)   [name] History    Smoking Tobacco Use: Every Day    Smokeless Tobacco Use: Never    Passive Exposure: Never  Financial Resource Strain: Low Risk  (07/08/2023)   Received from [facility]   Overall Financial Resource Strain (CARDIA)    Difficulty of Paying Living Expenses: Not hard at all  Food Insecurity: No Food Insecurity (11/30/2023)   Hunger Vital Sign    Worried About Running Out of Food in the Last Year: Never true    Ran Out of Food in the Last Year: Never true  Transportation Needs: No Transportation Needs (11/30/2023)   PRAPARE - Administrator, Civil Service (Medical): No    Lack of Transportation (Non-Medical): No  Physical Activity: Not on file  Stress: Not on file  Social Connections:  Socially Integrated (11/30/2023)   Social  Connection and Isolation Panel    Frequency of Communication with Friends and Family: Twice a week    Frequency of Social Gatherings with Friends and Family: Twice a week    Attends Religious Services: 1 to 4 times per year    Active Member of Clubs or Organizations: No    Attends Banker Meetings: 1 to 4 times per year    Marital Status: Married  Depression (PHQ2-9): Low Risk (02/13/2024)   Depression (PHQ2-9)    PHQ-2 Score: 0  Alcohol Screen: Not on file  Housing: Low Risk (11/30/2023)   Housing Stability Vital Sign    Unable to Pay for Housing in the Last Year: No    Number of Times Moved in the Last Year: 0    Homeless in the Last Year: No  Utilities: Not At Risk (11/30/2023)   AHC Utilities    Threatened with loss of utilities: No  Health Literacy: Not on file   Past Surgical History:  Procedure Laterality Date   ABDOMINAL HYSTERECTOMY     BACK SURGERY     CARDIAC CATHETERIZATION N/A 06/23/2015   Procedure: Left Heart Cath and Coronary Angiography;  Surgeon: Ezra GORMAN Shuck, MD;  Location: [facility] INVASIVE CV LAB;  Service: Cardiovascular;  Laterality: N/A;   CARDIAC CATHETERIZATION  1960   VSD was so small; didn't need repaired   COLECTOMY, SIGMOID, ROBOT-ASSISTED N/A 11/30/2023   Procedure: COLECTOMY, SIGMOID, ROBOT-ASSISTED ROBOTIC LOW ANTERIOR RECTOSIGMOID RESECTION (LAR) WITH ANASTOMOSIS COLOVAGINAL FISTULA TAKEDOWN & REPAIR OMENTOPEXY OF VAGINA INTRAOPERATIVE ASSESSMENT OF TISSUE VASCULAR PERFUSION USING ICG (indocyanine green ) IMMUNOFLUORESCENCE TRANSVERSUS ABDOMINIS PLANE (TAP) BLOCK - BILATERAL;  Surgeon: Sheldon Standing, MD;  Location: [facility] ORS;  Service: General;  Late   CYSTOSCOPY WITH INDOCYANINE GREEN  IMAGING (ICG) N/A 11/30/2023   Procedure: Cystoscopy with bilateral ureteral FireFly injections Transurethral resection of bladder tumor--large;  Surgeon: Carolee Sherwood JONETTA DOUGLAS, MD;  Location: [facility];  Service: Urology;   Laterality: N/A;   EXAM UNDER ANESTHESIA WITH MANIPULATION OF HIP Right 06/02/2014   dr sheril   FLEXIBLE SIGMOIDOSCOPY N/A 11/30/2023   Procedure: KINGSTON SIDE;  Surgeon: Sheldon Standing, MD;  Location: [facility];  Service: General;  Laterality: N/A;   FRACTURE SURGERY     HERNIA REPAIR     HIP CLOSED REDUCTION Right 06/02/2014   Procedure: CLOSED MANIPULATION HIP;  Surgeon: Maude KANDICE Sheril, MD;  Location: [facility] OR;  Service: Orthopedics;  Laterality: Right;   IR FLUORO GUIDED NEEDLE PLC ASPIRATION/INJECTION LOC  08/09/2023   JOINT REPLACEMENT     JOINT REPLACEMENT     POSTERIOR LUMBAR FUSION 4 LEVEL N/A 06/26/2022   Procedure: Lumbar One To Lumbar Five Posterior Instrumented Fusion;  Surgeon: Cheryle Debby LABOR, MD;  Location: [facility] OR;  Service: Neurosurgery;  Laterality: N/A;   REFRACTIVE SURGERY Bilateral    SHOULDER ARTHROSCOPY Right    SHOULDER OPEN ROTATOR CUFF REPAIR Right    SPINAL FUSION  1996   t10 down to my coccyx   SPINE HARDWARE REMOVAL     TOTAL ABDOMINAL HYSTERECTOMY     TOTAL HIP ARTHROPLASTY Right 05/10/2014   [facility]      by dr lonni olcott   TOTAL KNEE ARTHROPLASTY Left    TOTAL SHOULDER ARTHROPLASTY Left 08/19/2016   Procedure: TOTAL SHOULDER ARTHROPLASTY;  Surgeon: Eva Herring, MD;  Location: [facility];  Service: Orthopedics;  Laterality: Left;  Left total shoulder replacement   Past Surgical History:  Procedure Laterality Date   ABDOMINAL HYSTERECTOMY  BACK SURGERY     CARDIAC CATHETERIZATION N/A 06/23/2015   Procedure: Left Heart Cath and Coronary Angiography;  Surgeon: Ezra GORMAN Shuck, MD;  Location: [facility] INVASIVE CV LAB;  Service: Cardiovascular;  Laterality: N/A;   CARDIAC CATHETERIZATION  1960   VSD was so small; didn't need repaired   COLECTOMY, SIGMOID, ROBOT-ASSISTED N/A 11/30/2023   Procedure: COLECTOMY, SIGMOID, ROBOT-ASSISTED ROBOTIC LOW ANTERIOR RECTOSIGMOID RESECTION (LAR) WITH ANASTOMOSIS COLOVAGINAL FISTULA TAKEDOWN & REPAIR  OMENTOPEXY OF VAGINA INTRAOPERATIVE ASSESSMENT OF TISSUE VASCULAR PERFUSION USING ICG (indocyanine green ) IMMUNOFLUORESCENCE TRANSVERSUS ABDOMINIS PLANE (TAP) BLOCK - BILATERAL;  Surgeon: Sheldon Standing, MD;  Location: [facility];  Service: General;  Late   CYSTOSCOPY WITH INDOCYANINE GREEN  IMAGING (ICG) N/A 11/30/2023   Procedure: Cystoscopy with bilateral ureteral FireFly injections Transurethral resection of bladder tumor--large;  Surgeon: Carolee Sherwood JONETTA DOUGLAS, MD;  Location: [facility];  Service: Urology;  Laterality: N/A;   EXAM UNDER ANESTHESIA WITH MANIPULATION OF HIP Right 06/02/2014   dr sheril   FLEXIBLE SIGMOIDOSCOPY N/A 11/30/2023   Procedure: KINGSTON SIDE;  Surgeon: Sheldon Standing, MD;  Location: [facility];  Service: General;  Laterality: N/A;   FRACTURE SURGERY     HERNIA REPAIR     HIP CLOSED REDUCTION Right 06/02/2014   Procedure: CLOSED MANIPULATION HIP;  Surgeon: Maude KANDICE Sheril, MD;  Location: [facility];  Service: Orthopedics;  Laterality: Right;   IR FLUORO GUIDED NEEDLE PLC ASPIRATION/INJECTION LOC  08/09/2023   JOINT REPLACEMENT     JOINT REPLACEMENT     POSTERIOR LUMBAR FUSION 4 LEVEL N/A 06/26/2022   Procedure: Lumbar One To Lumbar Five Posterior Instrumented Fusion;  Surgeon: Cheryle Debby LABOR, MD;  Location: [facility];  Service: Neurosurgery;  Laterality: N/A;   REFRACTIVE SURGERY Bilateral    SHOULDER ARTHROSCOPY Right    SHOULDER OPEN ROTATOR CUFF REPAIR Right    SPINAL FUSION  1996   t10 down to my coccyx   SPINE HARDWARE REMOVAL     TOTAL ABDOMINAL HYSTERECTOMY     TOTAL HIP ARTHROPLASTY Right 05/10/2014   [facility]      by dr lonni olcott   TOTAL KNEE ARTHROPLASTY Left    TOTAL SHOULDER ARTHROPLASTY Left 08/19/2016   Procedure: TOTAL SHOULDER ARTHROPLASTY;  Surgeon: Eva Herring, MD;  Location: [facility];  Service: Orthopedics;  Laterality: Left;  Left total shoulder replacement   Past Medical History:  Diagnosis Date   Anemia    Anxiety    Arthritis     Bipolar disorder (HCC)    Cardiomyopathy (HCC)    Complication of anesthesia    Awake but could not move   Depression    Diskitis 10/04/2023   Fistula 07/09/2024   GERD (gastroesophageal reflux disease)    Heart failure (HCC)    Acute   Heart murmur    related to VSD   High cholesterol    History of blood transfusion    related to OR (08/19/2016)   History of hiatal hernia    Hypertension    Hyperthyroidism    Hypotension 09/06/2023   Mild cognitive impairment 09/06/2018   Paroxysmal ventricular tachycardia (HCC)    Pneumonia    Rectovaginal fistula 09/06/2023   Tachycardia, unspecified    Type II diabetes mellitus (HCC)    UTI (urinary tract infection)    being treated with Keflex    Ventricular septal defect    Ventriculitis of brain due to bacteria 11/02/2022   Vertebral fracture, osteoporotic (HCC) 01/06/2023   There were no vitals taken  for this visit.  Opioid Risk Score:   Fall Risk Score:  `1  Depression screen [facility] 2/9     02/13/2024   11:27 AM 10/20/2023   12:20 PM 09/19/2023   10:18 AM 09/07/2023    9:09 AM 06/30/2023   11:55 AM 03/15/2023   10:54 AM 01/06/2023   10:36 AM  Depression screen PHQ 2/9  Decreased Interest 0 2 0 1 1 1  0  Down, Depressed, Hopeless 0 2 1 0 1 1 1   PHQ - 2 Score 0 4 1 1 2 2 1   Altered sleeping  2       Tired, decreased energy  2       Change in appetite  1       Feeling bad or failure about yourself   0       Trouble concentrating  0       Moving slowly or fidgety/restless  0       Suicidal thoughts  0       PHQ-9 Score  9           Data saved with a previous flowsheet row definition     Review of Systems  Musculoskeletal:  Positive for back pain.       Right  hip pain  All other systems reviewed and are negative.      Objective:   Physical Exam   Gen: no distress, normal appearing HEENT: oral mucosa pink and moist, NCAT Chest: normal effort, normal rate of breathing Abd: soft, non-distended Ext: no edema Psych:  pleasant, normal affect Skin: Warm dry Neuro: Alert and awake, follows commands, cranial 2-12 grossly intact Strength least 4+/5 in all 4 extremities Sensation intact light touch in all 4 extremities Musculoskeletal: TTP lower T spine and L spine Hard palpable area on spine - unchanged    CT lumbar spine 12/18/2022 IMPRESSION: 1. Pronounced chronic osteopenia. Prior decompression and fusion throughout the lumbar spine.   2. L3 superior endplate/superior body non-acute fracture AND subtle associated horizontal fracture through the chronic posterior element bone mass there (series 9, image 53 today). But no displacement over this series of exams.   3. Difficult to exclude nondisplaced S3/S4 sacral fracture, with mild presacral stranding there.   4. But no other acute osseous abnormality identified. And elsewhere the chronic T12 through sacral ankylosis/arthrodesis appears intact.   5. T10 compression fracture is detailed separately today on CT Thoracic Spine.   We discussed getting stay CT thoracic spine 2022/12/18 IMPRESSION: 1. T10 compression fracture with 12% loss of height is new since 08/19/2022. No retropulsion or other complicating features. Unchanged adjacent chronic T10-T11 and T12-L1 posterior element ankylosis, with chronic non fusion of T11-T12. 2. No other acute osseous abnormality identified in the thoracic spine. Generalized osteopenia. Chronic mild T5, T6, and T11 compression fractures. 3. Increased elevation of the left hemidiaphragm and new left lung base consolidation since February. But small bilateral pleural effusions have resolved. 4. No CT evidence of thoracic spinal stenosis.   L spine MRI 10/07/23 IMPRESSION: 1. Postoperative changes of T11-S1 laminectomy and posterior spinal fusion with right posterior spinal instrumentation from L2-L5. On prior CT, there is evidence of fracture through the left posterior fusion mass at L4-5, correlating with  edema is seen in the left pedicle of L4 on today's study. 2. Decreased edema within the L4 and L5 vertebral bodies and posterior elements, as well as within the L4-5 and L3-4 discs, likely reactive and related to the above  fracture. 3. Loculated simple fluid within the thecal sac with some marginalized cauda equina nerve roots, most consistent with arachnoiditis. 4. No spinal canal stenosis or neural foraminal narrowing.       Assessment & Plan:   Osteomyelitis, infection of lumbar surgical site complicated by meningitis/ventriculitis.              -Continue to follow-up with NSGY as directed             -Continue to follow-up with infectious disease as directed - A new referral for physical therapy, including aquatic therapy, will be sent to [facility] ([facility] location).  Chronic thoracic and lower back pain. Complex scoliosis   -History of T11-S1 laminectomy and posterior spinal fusion, noted to have fracture to the left posterior fusion mass at L4-5, [name] has had several compression fractures             -She is previously on buprenorphine , oxycodone -review of PDMP indicates she has not been prescribed this since May  -Opiate risk tool moderate  - [name] is interested in [facility] -Reports very helpful for occasional acute pain exacerbations  -Continue Jour[facility]x - pt to call for refill   Insomnia  - Sleep hygiene  "

## 2024-09-20 ENCOUNTER — Ambulatory Visit: Admitting: Neurology

## 2024-12-20 ENCOUNTER — Encounter: Admitting: Physical Medicine & Rehabilitation

## 2025-01-31 ENCOUNTER — Ambulatory Visit: Admitting: Neurology
